# Patient Record
Sex: Female | Born: 1940
Health system: Southern US, Community
[De-identification: ages and names within clinical notes are randomized; demographics above are authoritative.]

## PROBLEM LIST (undated history)

## (undated) DIAGNOSIS — F329 Major depressive disorder, single episode, unspecified: Secondary | ICD-10-CM

## (undated) DIAGNOSIS — F32A Depression, unspecified: Secondary | ICD-10-CM

## (undated) DIAGNOSIS — M1990 Unspecified osteoarthritis, unspecified site: Secondary | ICD-10-CM

## (undated) DIAGNOSIS — R251 Tremor, unspecified: Secondary | ICD-10-CM

## (undated) DIAGNOSIS — I10 Essential (primary) hypertension: Secondary | ICD-10-CM

## (undated) DIAGNOSIS — K219 Gastro-esophageal reflux disease without esophagitis: Secondary | ICD-10-CM

## (undated) DIAGNOSIS — F419 Anxiety disorder, unspecified: Secondary | ICD-10-CM

## (undated) DIAGNOSIS — D563 Thalassemia minor: Secondary | ICD-10-CM

## (undated) DIAGNOSIS — D649 Anemia, unspecified: Secondary | ICD-10-CM

## (undated) DIAGNOSIS — R51 Headache: Secondary | ICD-10-CM

## (undated) DIAGNOSIS — N390 Urinary tract infection, site not specified: Secondary | ICD-10-CM

## (undated) DIAGNOSIS — G4733 Obstructive sleep apnea (adult) (pediatric): Secondary | ICD-10-CM

## (undated) DIAGNOSIS — L299 Pruritus, unspecified: Secondary | ICD-10-CM

## (undated) DIAGNOSIS — E669 Obesity, unspecified: Secondary | ICD-10-CM

## (undated) DIAGNOSIS — E785 Hyperlipidemia, unspecified: Secondary | ICD-10-CM

## (undated) DIAGNOSIS — E611 Iron deficiency: Secondary | ICD-10-CM

## (undated) DIAGNOSIS — L0390 Cellulitis, unspecified: Secondary | ICD-10-CM

## (undated) DIAGNOSIS — D509 Iron deficiency anemia, unspecified: Secondary | ICD-10-CM

## (undated) DIAGNOSIS — R569 Unspecified convulsions: Secondary | ICD-10-CM

## (undated) HISTORY — DX: Anemia, unspecified: D64.9

## (undated) HISTORY — DX: Major depressive disorder, single episode, unspecified: F32.9

## (undated) HISTORY — DX: Anxiety disorder, unspecified: F41.9

## (undated) HISTORY — DX: Essential (primary) hypertension: I10

## (undated) HISTORY — DX: Thalassemia minor: D56.3

## (undated) HISTORY — DX: Obesity, unspecified: E66.9

## (undated) HISTORY — DX: Iron deficiency: E61.1

## (undated) HISTORY — DX: Unspecified convulsions: R56.9

## (undated) HISTORY — DX: Tremor, unspecified: R25.1

## (undated) HISTORY — DX: Gastro-esophageal reflux disease without esophagitis: K21.9

## (undated) HISTORY — DX: Iron deficiency anemia, unspecified: D50.9

## (undated) HISTORY — PX: BREAST EXCISIONAL BIOPSY: SUR124

## (undated) HISTORY — DX: Urinary tract infection, site not specified: N39.0

## (undated) HISTORY — DX: Cellulitis, unspecified: L03.90

## (undated) HISTORY — DX: Headache: R51

## (undated) HISTORY — PX: CATARACT EXTRACTION: SUR2

## (undated) HISTORY — DX: Depression, unspecified: F32.A

## (undated) HISTORY — PX: COLONOSCOPY: SHX174

## (undated) HISTORY — DX: Hyperlipidemia, unspecified: E78.5

## (undated) HISTORY — DX: Unspecified osteoarthritis, unspecified site: M19.90

## (undated) HISTORY — DX: Pruritus, unspecified: L29.9

## (undated) HISTORY — PX: CHOLECYSTECTOMY: SHX55

## (undated) HISTORY — DX: Obstructive sleep apnea (adult) (pediatric): G47.33

## (undated) HISTORY — PX: ABDOMINAL HYSTERECTOMY: SHX81

---

## 2002-03-04 ENCOUNTER — Ambulatory Visit (HOSPITAL_BASED_OUTPATIENT_CLINIC_OR_DEPARTMENT_OTHER): Admission: RE | Admit: 2002-03-04 | Discharge: 2002-03-04 | Payer: Self-pay | Admitting: Family Medicine

## 2003-07-27 ENCOUNTER — Ambulatory Visit (HOSPITAL_COMMUNITY): Admission: RE | Admit: 2003-07-27 | Discharge: 2003-07-27 | Payer: Self-pay | Admitting: Internal Medicine

## 2003-12-02 ENCOUNTER — Ambulatory Visit (HOSPITAL_COMMUNITY): Admission: RE | Admit: 2003-12-02 | Discharge: 2003-12-02 | Payer: Self-pay | Admitting: Family Medicine

## 2004-07-31 ENCOUNTER — Ambulatory Visit: Payer: Self-pay | Admitting: Family Medicine

## 2004-08-24 ENCOUNTER — Ambulatory Visit: Payer: Self-pay | Admitting: Family Medicine

## 2004-08-29 ENCOUNTER — Ambulatory Visit: Payer: Self-pay | Admitting: Orthopedic Surgery

## 2004-09-22 ENCOUNTER — Ambulatory Visit: Payer: Self-pay | Admitting: Family Medicine

## 2004-09-28 ENCOUNTER — Ambulatory Visit: Payer: Self-pay | Admitting: Orthopedic Surgery

## 2004-11-21 ENCOUNTER — Ambulatory Visit: Payer: Self-pay | Admitting: Family Medicine

## 2004-11-23 ENCOUNTER — Ambulatory Visit: Payer: Self-pay | Admitting: Orthopedic Surgery

## 2004-11-23 ENCOUNTER — Ambulatory Visit (HOSPITAL_COMMUNITY): Admission: RE | Admit: 2004-11-23 | Discharge: 2004-11-23 | Payer: Self-pay | Admitting: Family Medicine

## 2004-12-18 ENCOUNTER — Emergency Department (HOSPITAL_COMMUNITY): Admission: EM | Admit: 2004-12-18 | Discharge: 2004-12-18 | Payer: Self-pay | Admitting: Emergency Medicine

## 2004-12-20 ENCOUNTER — Ambulatory Visit: Payer: Self-pay | Admitting: Family Medicine

## 2005-01-10 ENCOUNTER — Ambulatory Visit: Payer: Self-pay | Admitting: Family Medicine

## 2005-02-09 ENCOUNTER — Ambulatory Visit: Payer: Self-pay | Admitting: Family Medicine

## 2005-03-12 ENCOUNTER — Ambulatory Visit: Payer: Self-pay | Admitting: Family Medicine

## 2005-05-11 ENCOUNTER — Ambulatory Visit: Payer: Self-pay | Admitting: Family Medicine

## 2005-06-29 ENCOUNTER — Ambulatory Visit: Payer: Self-pay | Admitting: Family Medicine

## 2005-08-20 ENCOUNTER — Ambulatory Visit: Payer: Self-pay | Admitting: Family Medicine

## 2005-10-09 ENCOUNTER — Ambulatory Visit: Payer: Self-pay | Admitting: Family Medicine

## 2005-10-30 ENCOUNTER — Ambulatory Visit: Payer: Self-pay | Admitting: Family Medicine

## 2005-11-15 ENCOUNTER — Ambulatory Visit: Payer: Self-pay | Admitting: Family Medicine

## 2005-11-15 ENCOUNTER — Ambulatory Visit (HOSPITAL_COMMUNITY): Admission: RE | Admit: 2005-11-15 | Discharge: 2005-11-15 | Payer: Self-pay | Admitting: Family Medicine

## 2005-12-17 ENCOUNTER — Ambulatory Visit: Payer: Self-pay | Admitting: Family Medicine

## 2006-01-28 ENCOUNTER — Ambulatory Visit: Payer: Self-pay | Admitting: Family Medicine

## 2006-02-20 ENCOUNTER — Ambulatory Visit: Payer: Self-pay | Admitting: Family Medicine

## 2006-03-13 ENCOUNTER — Ambulatory Visit: Payer: Self-pay | Admitting: Internal Medicine

## 2006-03-14 ENCOUNTER — Ambulatory Visit (HOSPITAL_COMMUNITY): Admission: RE | Admit: 2006-03-14 | Discharge: 2006-03-14 | Payer: Self-pay | Admitting: Internal Medicine

## 2006-03-19 ENCOUNTER — Ambulatory Visit: Payer: Self-pay | Admitting: Family Medicine

## 2006-03-20 ENCOUNTER — Ambulatory Visit (HOSPITAL_COMMUNITY): Admission: RE | Admit: 2006-03-20 | Discharge: 2006-03-20 | Payer: Self-pay | Admitting: Family Medicine

## 2006-04-01 ENCOUNTER — Ambulatory Visit: Payer: Self-pay | Admitting: Cardiology

## 2006-04-04 ENCOUNTER — Ambulatory Visit: Payer: Self-pay | Admitting: Internal Medicine

## 2006-04-08 ENCOUNTER — Ambulatory Visit: Payer: Self-pay | Admitting: Cardiology

## 2006-04-23 ENCOUNTER — Ambulatory Visit: Payer: Self-pay | Admitting: Family Medicine

## 2006-05-29 ENCOUNTER — Ambulatory Visit: Payer: Self-pay | Admitting: Family Medicine

## 2006-08-07 ENCOUNTER — Ambulatory Visit: Payer: Self-pay | Admitting: Family Medicine

## 2006-09-03 ENCOUNTER — Ambulatory Visit: Payer: Self-pay | Admitting: Family Medicine

## 2006-10-15 ENCOUNTER — Ambulatory Visit: Payer: Self-pay | Admitting: Family Medicine

## 2006-10-15 LAB — CONVERTED CEMR LAB: Microalb, Ur: 0.78 mg/dL (ref 0.00–1.89)

## 2006-11-22 ENCOUNTER — Encounter: Payer: Self-pay | Admitting: Family Medicine

## 2006-11-22 LAB — CONVERTED CEMR LAB: Hgb A1c MFr Bld: 7.9 % — ABNORMAL HIGH (ref 4.6–6.1)

## 2006-11-26 ENCOUNTER — Ambulatory Visit: Payer: Self-pay | Admitting: Family Medicine

## 2007-01-22 ENCOUNTER — Ambulatory Visit: Payer: Self-pay | Admitting: Family Medicine

## 2007-01-23 ENCOUNTER — Encounter: Payer: Self-pay | Admitting: Family Medicine

## 2007-02-14 ENCOUNTER — Encounter: Payer: Self-pay | Admitting: Family Medicine

## 2007-02-14 LAB — CONVERTED CEMR LAB
ALT: 13 units/L (ref 0–35)
AST: 14 units/L (ref 0–37)
Albumin: 4.4 g/dL (ref 3.5–5.2)
Alkaline Phosphatase: 98 units/L (ref 39–117)
BUN: 15 mg/dL (ref 6–23)
Basophils Absolute: 0 10*3/uL (ref 0.0–0.1)
Basophils Relative: 0 % (ref 0–1)
Bilirubin, Direct: 0.1 mg/dL (ref 0.0–0.3)
CO2: 30 meq/L (ref 19–32)
Calcium: 9.5 mg/dL (ref 8.4–10.5)
Chloride: 101 meq/L (ref 96–112)
Cholesterol: 167 mg/dL (ref 0–200)
Creatinine, Ser: 0.8 mg/dL (ref 0.40–1.20)
Eosinophils Absolute: 0.2 10*3/uL (ref 0.0–0.7)
Eosinophils Relative: 3 % (ref 0–5)
Glucose, Bld: 163 mg/dL — ABNORMAL HIGH (ref 70–99)
HCT: 35.2 % — ABNORMAL LOW (ref 36.0–46.0)
HDL: 74 mg/dL (ref >39)
Hemoglobin: 10.4 g/dL — ABNORMAL LOW (ref 12.0–15.0)
Hgb A1c MFr Bld: 7.8 % — ABNORMAL HIGH (ref 4.6–6.1)
Indirect Bilirubin: 0.3 mg/dL (ref 0.0–0.9)
LDL Cholesterol: 76 mg/dL (ref 0–99)
Lymphocytes Relative: 41 % (ref 12–46)
Lymphs Abs: 2.5 10*3/uL (ref 0.7–3.3)
MCHC: 29.5 g/dL — ABNORMAL LOW (ref 30.0–36.0)
MCV: 70.7 fL — ABNORMAL LOW (ref 78.0–100.0)
Monocytes Absolute: 0.4 10*3/uL (ref 0.2–0.7)
Monocytes Relative: 7 % (ref 3–11)
Neutro Abs: 3 10*3/uL (ref 1.7–7.7)
Neutrophils Relative %: 48 % (ref 43–77)
Platelets: 214 10*3/uL (ref 150–400)
Potassium: 4 meq/L (ref 3.5–5.3)
RBC: 4.98 M/uL (ref 3.87–5.11)
RDW: 15.9 % — ABNORMAL HIGH (ref 11.5–14.0)
Sodium: 143 meq/L (ref 135–145)
Total Bilirubin: 0.4 mg/dL (ref 0.3–1.2)
Total CHOL/HDL Ratio: 2.3
Total Protein: 7.6 g/dL (ref 6.0–8.3)
Triglycerides: 83 mg/dL (ref <150)
VLDL: 17 mg/dL (ref 0–40)
WBC: 6.1 10*3/uL (ref 4.0–10.5)

## 2007-02-18 ENCOUNTER — Encounter: Payer: Self-pay | Admitting: Family Medicine

## 2007-02-18 LAB — CONVERTED CEMR LAB
Ferritin: 167 ng/mL (ref 10–291)
Folate: 8.3 ng/mL
Iron: 54 ug/dL (ref 42–145)
Saturation Ratios: 17 % — ABNORMAL LOW (ref 20–55)
TIBC: 321 ug/dL (ref 250–470)
UIBC: 267 ug/dL
Vitamin B-12: 458 pg/mL (ref 211–911)

## 2007-02-26 ENCOUNTER — Ambulatory Visit: Payer: Self-pay | Admitting: Family Medicine

## 2007-03-06 ENCOUNTER — Ambulatory Visit (HOSPITAL_COMMUNITY): Admission: RE | Admit: 2007-03-06 | Discharge: 2007-03-06 | Payer: Self-pay | Admitting: Family Medicine

## 2007-04-08 ENCOUNTER — Ambulatory Visit: Payer: Self-pay | Admitting: Family Medicine

## 2007-04-09 ENCOUNTER — Encounter: Payer: Self-pay | Admitting: Family Medicine

## 2007-04-10 ENCOUNTER — Ambulatory Visit (HOSPITAL_COMMUNITY): Admission: RE | Admit: 2007-04-10 | Discharge: 2007-04-10 | Payer: Self-pay | Admitting: Family Medicine

## 2007-04-24 ENCOUNTER — Ambulatory Visit (HOSPITAL_COMMUNITY): Admission: RE | Admit: 2007-04-24 | Discharge: 2007-04-24 | Payer: Self-pay | Admitting: Pediatrics

## 2007-07-03 ENCOUNTER — Other Ambulatory Visit: Admission: RE | Admit: 2007-07-03 | Discharge: 2007-07-03 | Payer: Self-pay | Admitting: Family Medicine

## 2007-07-03 ENCOUNTER — Ambulatory Visit: Payer: Self-pay | Admitting: Family Medicine

## 2007-07-03 LAB — CONVERTED CEMR LAB: Pap Smear: NORMAL

## 2007-07-14 ENCOUNTER — Encounter: Payer: Self-pay | Admitting: Family Medicine

## 2007-07-14 LAB — CONVERTED CEMR LAB
ALT: 21 units/L (ref 0–35)
AST: 20 units/L (ref 0–37)
Albumin: 4.3 g/dL (ref 3.5–5.2)
Alkaline Phosphatase: 93 units/L (ref 39–117)
BUN: 10 mg/dL (ref 6–23)
Bilirubin, Direct: 0.1 mg/dL (ref 0.0–0.3)
CO2: 30 meq/L (ref 19–32)
Calcium: 9.5 mg/dL (ref 8.4–10.5)
Chloride: 103 meq/L (ref 96–112)
Cholesterol: 160 mg/dL (ref 0–200)
Creatinine, Ser: 0.66 mg/dL (ref 0.40–1.20)
Glucose, Bld: 180 mg/dL — ABNORMAL HIGH (ref 70–99)
HDL: 62 mg/dL (ref >39)
Indirect Bilirubin: 0.2 mg/dL (ref 0.0–0.9)
LDL Cholesterol: 66 mg/dL (ref 0–99)
Potassium: 4.3 meq/L (ref 3.5–5.3)
Sodium: 143 meq/L (ref 135–145)
Total Bilirubin: 0.3 mg/dL (ref 0.3–1.2)
Total CHOL/HDL Ratio: 2.6
Total Protein: 7.6 g/dL (ref 6.0–8.3)
Triglycerides: 159 mg/dL — ABNORMAL HIGH (ref <150)
VLDL: 32 mg/dL (ref 0–40)

## 2007-08-14 ENCOUNTER — Ambulatory Visit: Payer: Self-pay | Admitting: Family Medicine

## 2007-08-28 ENCOUNTER — Ambulatory Visit: Payer: Self-pay | Admitting: Family Medicine

## 2007-09-05 ENCOUNTER — Encounter: Payer: Self-pay | Admitting: Family Medicine

## 2007-09-09 ENCOUNTER — Ambulatory Visit: Payer: Self-pay | Admitting: Family Medicine

## 2007-10-01 ENCOUNTER — Encounter: Payer: Self-pay | Admitting: Family Medicine

## 2007-10-01 LAB — CONVERTED CEMR LAB
ALT: 26 units/L (ref 0–35)
AST: 19 units/L (ref 0–37)
Albumin: 4.3 g/dL (ref 3.5–5.2)
Alkaline Phosphatase: 101 units/L (ref 39–117)
BUN: 16 mg/dL (ref 6–23)
Bilirubin, Direct: 0.1 mg/dL (ref 0.0–0.3)
CO2: 28 meq/L (ref 19–32)
Calcium: 9.2 mg/dL (ref 8.4–10.5)
Chloride: 100 meq/L (ref 96–112)
Cholesterol: 161 mg/dL (ref 0–200)
Creatinine, Ser: 0.76 mg/dL (ref 0.40–1.20)
Glucose, Bld: 238 mg/dL — ABNORMAL HIGH (ref 70–99)
HDL: 73 mg/dL (ref >39)
Indirect Bilirubin: 0.2 mg/dL (ref 0.0–0.9)
LDL Cholesterol: 69 mg/dL (ref 0–99)
Microalb, Ur: 0.9 mg/dL (ref 0.00–1.89)
Potassium: 4.5 meq/L (ref 3.5–5.3)
Sodium: 140 meq/L (ref 135–145)
TSH: 2.276 microintl units/mL (ref 0.350–5.50)
Total Bilirubin: 0.3 mg/dL (ref 0.3–1.2)
Total CHOL/HDL Ratio: 2.2
Total Protein: 7.7 g/dL (ref 6.0–8.3)
Triglycerides: 96 mg/dL (ref <150)
VLDL: 19 mg/dL (ref 0–40)

## 2007-10-06 ENCOUNTER — Ambulatory Visit: Payer: Self-pay | Admitting: Family Medicine

## 2007-11-03 ENCOUNTER — Emergency Department (HOSPITAL_COMMUNITY): Admission: EM | Admit: 2007-11-03 | Discharge: 2007-11-03 | Payer: Self-pay | Admitting: Emergency Medicine

## 2007-12-18 ENCOUNTER — Ambulatory Visit: Payer: Self-pay | Admitting: Family Medicine

## 2008-01-29 ENCOUNTER — Ambulatory Visit: Payer: Self-pay | Admitting: Family Medicine

## 2008-02-09 ENCOUNTER — Encounter: Payer: Self-pay | Admitting: Family Medicine

## 2008-02-09 LAB — CONVERTED CEMR LAB
ALT: 26 units/L (ref 0–35)
AST: 23 units/L (ref 0–37)
Albumin: 4.2 g/dL (ref 3.5–5.2)
Alkaline Phosphatase: 86 units/L (ref 39–117)
BUN: 12 mg/dL (ref 6–23)
Basophils Absolute: 0 10*3/uL (ref 0.0–0.1)
Basophils Relative: 0 % (ref 0–1)
Bilirubin, Direct: 0.1 mg/dL (ref 0.0–0.3)
CO2: 30 meq/L (ref 19–32)
Calcium: 9.3 mg/dL (ref 8.4–10.5)
Chloride: 103 meq/L (ref 96–112)
Cholesterol: 167 mg/dL (ref 0–200)
Creatinine, Ser: 0.68 mg/dL (ref 0.40–1.20)
Eosinophils Absolute: 0.2 10*3/uL (ref 0.0–0.7)
Eosinophils Relative: 4 % (ref 0–5)
Glucose, Bld: 168 mg/dL — ABNORMAL HIGH (ref 70–99)
HCT: 34.4 % — ABNORMAL LOW (ref 36.0–46.0)
HDL: 64 mg/dL (ref >39)
Hemoglobin: 10.2 g/dL — ABNORMAL LOW (ref 12.0–15.0)
Indirect Bilirubin: 0.2 mg/dL (ref 0.0–0.9)
LDL Cholesterol: 81 mg/dL (ref 0–99)
Lymphocytes Relative: 40 % (ref 12–46)
Lymphs Abs: 2.4 10*3/uL (ref 0.7–4.0)
MCHC: 29.7 g/dL — ABNORMAL LOW (ref 30.0–36.0)
MCV: 69.4 fL — ABNORMAL LOW (ref 78.0–100.0)
Monocytes Absolute: 0.3 10*3/uL (ref 0.1–1.0)
Monocytes Relative: 5 % (ref 3–12)
Neutro Abs: 3.1 10*3/uL (ref 1.7–7.7)
Neutrophils Relative %: 51 % (ref 43–77)
Platelets: 212 10*3/uL (ref 150–400)
Potassium: 4.1 meq/L (ref 3.5–5.3)
RBC: 4.96 M/uL (ref 3.87–5.11)
RDW: 14.9 % (ref 11.5–15.5)
Sodium: 144 meq/L (ref 135–145)
Total Bilirubin: 0.3 mg/dL (ref 0.3–1.2)
Total CHOL/HDL Ratio: 2.6
Total Protein: 7.5 g/dL (ref 6.0–8.3)
Triglycerides: 111 mg/dL (ref <150)
VLDL: 22 mg/dL (ref 0–40)
WBC: 6.1 10*3/uL (ref 4.0–10.5)

## 2008-03-10 ENCOUNTER — Ambulatory Visit: Payer: Self-pay | Admitting: Family Medicine

## 2008-04-21 ENCOUNTER — Encounter: Payer: Self-pay | Admitting: Family Medicine

## 2008-04-21 ENCOUNTER — Ambulatory Visit: Payer: Self-pay | Admitting: Family Medicine

## 2008-04-21 DIAGNOSIS — F419 Anxiety disorder, unspecified: Secondary | ICD-10-CM

## 2008-04-21 DIAGNOSIS — K219 Gastro-esophageal reflux disease without esophagitis: Secondary | ICD-10-CM | POA: Insufficient documentation

## 2008-04-21 DIAGNOSIS — R32 Unspecified urinary incontinence: Secondary | ICD-10-CM | POA: Insufficient documentation

## 2008-04-21 DIAGNOSIS — I10 Essential (primary) hypertension: Secondary | ICD-10-CM | POA: Insufficient documentation

## 2008-04-21 DIAGNOSIS — M1712 Unilateral primary osteoarthritis, left knee: Secondary | ICD-10-CM | POA: Insufficient documentation

## 2008-04-21 DIAGNOSIS — F32A Depression, unspecified: Secondary | ICD-10-CM | POA: Insufficient documentation

## 2008-04-21 DIAGNOSIS — F411 Generalized anxiety disorder: Secondary | ICD-10-CM | POA: Insufficient documentation

## 2008-04-21 DIAGNOSIS — F329 Major depressive disorder, single episode, unspecified: Secondary | ICD-10-CM

## 2008-04-21 DIAGNOSIS — E785 Hyperlipidemia, unspecified: Secondary | ICD-10-CM | POA: Insufficient documentation

## 2008-04-21 LAB — CONVERTED CEMR LAB
Glucose, Bld: 342 mg/dL
Hgb A1c MFr Bld: 11 %

## 2008-04-22 ENCOUNTER — Encounter: Payer: Self-pay | Admitting: Family Medicine

## 2008-04-22 ENCOUNTER — Ambulatory Visit (HOSPITAL_COMMUNITY): Admission: RE | Admit: 2008-04-22 | Discharge: 2008-04-22 | Payer: Self-pay | Admitting: Family Medicine

## 2008-04-28 ENCOUNTER — Telehealth: Payer: Self-pay | Admitting: Family Medicine

## 2008-06-01 ENCOUNTER — Telehealth: Payer: Self-pay | Admitting: Family Medicine

## 2008-07-15 ENCOUNTER — Ambulatory Visit: Payer: Self-pay | Admitting: Family Medicine

## 2008-07-15 LAB — CONVERTED CEMR LAB
Glucose, Bld: 219 mg/dL
Hgb A1c MFr Bld: 11.7 %

## 2008-08-30 ENCOUNTER — Telehealth: Payer: Self-pay | Admitting: Family Medicine

## 2008-09-08 ENCOUNTER — Encounter: Payer: Self-pay | Admitting: Family Medicine

## 2008-09-09 LAB — CONVERTED CEMR LAB
ALT: 24 units/L (ref 0–35)
AST: 23 units/L (ref 0–37)
Albumin: 4.4 g/dL (ref 3.5–5.2)
Alkaline Phosphatase: 79 units/L (ref 39–117)
BUN: 19 mg/dL (ref 6–23)
Bilirubin, Direct: 0.1 mg/dL (ref 0.0–0.3)
CO2: 24 meq/L (ref 19–32)
Calcium: 9.6 mg/dL (ref 8.4–10.5)
Chloride: 102 meq/L (ref 96–112)
Cholesterol: 197 mg/dL (ref 0–200)
Creatinine, Ser: 0.74 mg/dL (ref 0.40–1.20)
Glucose, Bld: 138 mg/dL — ABNORMAL HIGH (ref 70–99)
HDL: 64 mg/dL (ref >39)
Indirect Bilirubin: 0.3 mg/dL (ref 0.0–0.9)
LDL Cholesterol: 110 mg/dL — ABNORMAL HIGH (ref 0–99)
Potassium: 4.1 meq/L (ref 3.5–5.3)
Sodium: 143 meq/L (ref 135–145)
Total Bilirubin: 0.4 mg/dL (ref 0.3–1.2)
Total CHOL/HDL Ratio: 3.1
Total Protein: 7.8 g/dL (ref 6.0–8.3)
Triglycerides: 113 mg/dL (ref <150)
VLDL: 23 mg/dL (ref 0–40)

## 2008-09-13 ENCOUNTER — Encounter: Payer: Self-pay | Admitting: Family Medicine

## 2008-09-14 ENCOUNTER — Encounter: Payer: Self-pay | Admitting: Family Medicine

## 2008-09-28 ENCOUNTER — Telehealth: Payer: Self-pay | Admitting: Family Medicine

## 2008-09-28 ENCOUNTER — Encounter: Payer: Self-pay | Admitting: Family Medicine

## 2008-10-08 ENCOUNTER — Telehealth: Payer: Self-pay | Admitting: Family Medicine

## 2008-10-21 ENCOUNTER — Ambulatory Visit: Payer: Self-pay | Admitting: Family Medicine

## 2008-10-21 ENCOUNTER — Ambulatory Visit (HOSPITAL_COMMUNITY): Admission: RE | Admit: 2008-10-21 | Discharge: 2008-10-21 | Payer: Self-pay | Admitting: Family Medicine

## 2008-10-21 DIAGNOSIS — M542 Cervicalgia: Secondary | ICD-10-CM

## 2008-10-21 HISTORY — DX: Cervicalgia: M54.2

## 2008-10-21 LAB — CONVERTED CEMR LAB
ALT: 19 units/L (ref 0–35)
AST: 22 units/L (ref 0–37)
Albumin: 4.2 g/dL (ref 3.5–5.2)
Alkaline Phosphatase: 78 units/L (ref 39–117)
BUN: 17 mg/dL (ref 6–23)
Bilirubin, Direct: 0.1 mg/dL (ref 0.0–0.3)
CO2: 26 meq/L (ref 19–32)
Calcium: 9.4 mg/dL (ref 8.4–10.5)
Chloride: 103 meq/L (ref 96–112)
Cholesterol: 185 mg/dL (ref 0–200)
Creatinine, Ser: 0.76 mg/dL (ref 0.40–1.20)
Glucose, Bld: 104 mg/dL
Glucose, Bld: 96 mg/dL (ref 70–99)
HDL: 58 mg/dL (ref >39)
Hgb A1c MFr Bld: 10 %
Indirect Bilirubin: 0.2 mg/dL (ref 0.0–0.9)
LDL Cholesterol: 109 mg/dL — ABNORMAL HIGH (ref 0–99)
Potassium: 4.4 meq/L (ref 3.5–5.3)
Sodium: 143 meq/L (ref 135–145)
Total Bilirubin: 0.3 mg/dL (ref 0.3–1.2)
Total CHOL/HDL Ratio: 3.2
Total Protein: 7.4 g/dL (ref 6.0–8.3)
Triglycerides: 88 mg/dL (ref <150)
VLDL: 18 mg/dL (ref 0–40)

## 2008-10-26 ENCOUNTER — Encounter: Payer: Self-pay | Admitting: Family Medicine

## 2008-11-02 ENCOUNTER — Encounter: Payer: Self-pay | Admitting: Family Medicine

## 2008-11-09 ENCOUNTER — Encounter (HOSPITAL_COMMUNITY): Admission: RE | Admit: 2008-11-09 | Discharge: 2008-12-09 | Payer: Self-pay | Admitting: Family Medicine

## 2008-11-09 ENCOUNTER — Encounter: Payer: Self-pay | Admitting: Family Medicine

## 2008-11-14 ENCOUNTER — Emergency Department (HOSPITAL_COMMUNITY): Admission: EM | Admit: 2008-11-14 | Discharge: 2008-11-14 | Payer: Self-pay | Admitting: Emergency Medicine

## 2008-11-18 ENCOUNTER — Telehealth: Payer: Self-pay | Admitting: Family Medicine

## 2008-11-18 ENCOUNTER — Encounter: Payer: Self-pay | Admitting: Family Medicine

## 2008-12-02 ENCOUNTER — Telehealth: Payer: Self-pay | Admitting: Family Medicine

## 2008-12-03 ENCOUNTER — Ambulatory Visit: Payer: Self-pay | Admitting: Family Medicine

## 2008-12-03 LAB — CONVERTED CEMR LAB: Blood Glucose, Fasting: 151 mg/dL

## 2008-12-23 ENCOUNTER — Encounter: Payer: Self-pay | Admitting: Family Medicine

## 2009-01-10 ENCOUNTER — Encounter: Payer: Self-pay | Admitting: Family Medicine

## 2009-01-11 ENCOUNTER — Telehealth: Payer: Self-pay | Admitting: Family Medicine

## 2009-01-13 ENCOUNTER — Encounter: Payer: Self-pay | Admitting: Family Medicine

## 2009-01-19 ENCOUNTER — Ambulatory Visit: Payer: Self-pay | Admitting: Family Medicine

## 2009-01-19 LAB — CONVERTED CEMR LAB
Glucose, Bld: 231 mg/dL
Hgb A1c MFr Bld: 9.5 %

## 2009-02-02 ENCOUNTER — Ambulatory Visit (HOSPITAL_COMMUNITY): Admission: RE | Admit: 2009-02-02 | Discharge: 2009-02-02 | Payer: Self-pay | Admitting: Family Medicine

## 2009-02-02 ENCOUNTER — Ambulatory Visit: Payer: Self-pay | Admitting: Family Medicine

## 2009-02-02 LAB — CONVERTED CEMR LAB: Glucose, Bld: 254 mg/dL

## 2009-02-03 ENCOUNTER — Encounter: Payer: Self-pay | Admitting: Family Medicine

## 2009-02-17 ENCOUNTER — Ambulatory Visit (HOSPITAL_COMMUNITY): Admission: RE | Admit: 2009-02-17 | Discharge: 2009-02-17 | Payer: Self-pay | Admitting: Family Medicine

## 2009-02-23 ENCOUNTER — Ambulatory Visit: Admission: RE | Admit: 2009-02-23 | Discharge: 2009-02-23 | Payer: Self-pay | Admitting: Family Medicine

## 2009-02-23 ENCOUNTER — Encounter: Payer: Self-pay | Admitting: Family Medicine

## 2009-03-07 ENCOUNTER — Encounter: Payer: Self-pay | Admitting: Family Medicine

## 2009-03-10 ENCOUNTER — Ambulatory Visit: Payer: Self-pay | Admitting: Family Medicine

## 2009-03-11 ENCOUNTER — Encounter: Payer: Self-pay | Admitting: Family Medicine

## 2009-03-11 LAB — CONVERTED CEMR LAB
ALT: 30 units/L (ref 0–35)
AST: 31 units/L (ref 0–37)
Albumin: 4.2 g/dL (ref 3.5–5.2)
Alkaline Phosphatase: 80 units/L (ref 39–117)
BUN: 13 mg/dL (ref 6–23)
Bilirubin, Direct: 0.2 mg/dL (ref 0.0–0.3)
CO2: 26 meq/L (ref 19–32)
Calcium: 9.5 mg/dL (ref 8.4–10.5)
Chloride: 100 meq/L (ref 96–112)
Cholesterol: 196 mg/dL (ref 0–200)
Creatinine, Ser: 0.79 mg/dL (ref 0.40–1.20)
Creatinine, Urine: 144.2 mg/dL
Glucose, Bld: 237 mg/dL — ABNORMAL HIGH (ref 70–99)
HDL: 65 mg/dL (ref >39)
Indirect Bilirubin: 0.2 mg/dL (ref 0.0–0.9)
LDL Cholesterol: 108 mg/dL — ABNORMAL HIGH (ref 0–99)
Microalb Creat Ratio: 14.9 mg/g (ref 0.0–30.0)
Microalb, Ur: 2.15 mg/dL — ABNORMAL HIGH (ref 0.00–1.89)
Potassium: 4.6 meq/L (ref 3.5–5.3)
Sodium: 140 meq/L (ref 135–145)
TSH: 1.184 microintl units/mL (ref 0.350–4.500)
Total Bilirubin: 0.4 mg/dL (ref 0.3–1.2)
Total CHOL/HDL Ratio: 3
Total Protein: 7.8 g/dL (ref 6.0–8.3)
Triglycerides: 117 mg/dL (ref <150)
VLDL: 23 mg/dL (ref 0–40)

## 2009-03-21 ENCOUNTER — Telehealth: Payer: Self-pay | Admitting: Family Medicine

## 2009-04-01 ENCOUNTER — Encounter: Payer: Self-pay | Admitting: Family Medicine

## 2009-04-07 ENCOUNTER — Encounter: Payer: Self-pay | Admitting: Family Medicine

## 2009-04-20 ENCOUNTER — Ambulatory Visit: Payer: Self-pay | Admitting: Family Medicine

## 2009-04-20 LAB — CONVERTED CEMR LAB
Glucose, Bld: 332 mg/dL
Hgb A1c MFr Bld: 9.5 %

## 2009-05-17 ENCOUNTER — Telehealth: Payer: Self-pay | Admitting: Family Medicine

## 2009-05-17 ENCOUNTER — Encounter: Payer: Self-pay | Admitting: Family Medicine

## 2009-06-01 ENCOUNTER — Encounter: Payer: Self-pay | Admitting: Family Medicine

## 2009-06-03 ENCOUNTER — Telehealth: Payer: Self-pay | Admitting: Family Medicine

## 2009-06-14 ENCOUNTER — Telehealth: Payer: Self-pay | Admitting: Family Medicine

## 2009-06-17 ENCOUNTER — Ambulatory Visit: Payer: Self-pay | Admitting: Family Medicine

## 2009-06-17 DIAGNOSIS — R5381 Other malaise: Secondary | ICD-10-CM | POA: Insufficient documentation

## 2009-06-17 DIAGNOSIS — R5383 Other fatigue: Secondary | ICD-10-CM

## 2009-06-17 LAB — CONVERTED CEMR LAB: Glucose, Bld: 113 mg/dL

## 2009-06-20 ENCOUNTER — Encounter: Payer: Self-pay | Admitting: Family Medicine

## 2009-06-20 LAB — CONVERTED CEMR LAB
ALT: 25 units/L (ref 0–35)
AST: 25 units/L (ref 0–37)
Albumin: 4.4 g/dL (ref 3.5–5.2)
Alkaline Phosphatase: 78 units/L (ref 39–117)
BUN: 13 mg/dL (ref 6–23)
Basophils Absolute: 0 10*3/uL (ref 0.0–0.1)
Basophils Relative: 0 % (ref 0–1)
Bilirubin, Direct: <0.1 mg/dL (ref 0.0–0.3)
CO2: 28 meq/L (ref 19–32)
Calcium: 9.2 mg/dL (ref 8.4–10.5)
Chloride: 102 meq/L (ref 96–112)
Cholesterol: 192 mg/dL (ref 0–200)
Creatinine, Ser: 0.74 mg/dL (ref 0.40–1.20)
Eosinophils Absolute: 0.2 10*3/uL (ref 0.0–0.7)
Eosinophils Relative: 3 % (ref 0–5)
Glucose, Bld: 82 mg/dL (ref 70–99)
HCT: 36.4 % (ref 36.0–46.0)
HDL: 71 mg/dL (ref >39)
Hemoglobin: 10.6 g/dL — ABNORMAL LOW (ref 12.0–15.0)
LDL Cholesterol: 99 mg/dL (ref 0–99)
Lymphocytes Relative: 42 % (ref 12–46)
Lymphs Abs: 2.7 10*3/uL (ref 0.7–4.0)
MCHC: 29.1 g/dL — ABNORMAL LOW (ref 30.0–36.0)
MCV: 69.3 fL — ABNORMAL LOW (ref 78.0–100.0)
Monocytes Absolute: 0.4 10*3/uL (ref 0.1–1.0)
Monocytes Relative: 7 % (ref 3–12)
Neutro Abs: 3.1 10*3/uL (ref 1.7–7.7)
Neutrophils Relative %: 48 % (ref 43–77)
Platelets: 228 10*3/uL (ref 150–400)
Potassium: 4.3 meq/L (ref 3.5–5.3)
RBC: 5.25 M/uL — ABNORMAL HIGH (ref 3.87–5.11)
RDW: 15.7 % — ABNORMAL HIGH (ref 11.5–15.5)
Sodium: 143 meq/L (ref 135–145)
Total Bilirubin: 0.3 mg/dL (ref 0.3–1.2)
Total CHOL/HDL Ratio: 2.7
Total Protein: 7.5 g/dL (ref 6.0–8.3)
Triglycerides: 112 mg/dL (ref <150)
VLDL: 22 mg/dL (ref 0–40)
WBC: 6.5 10*3/uL (ref 4.0–10.5)

## 2009-06-27 ENCOUNTER — Encounter: Payer: Self-pay | Admitting: Family Medicine

## 2009-08-24 ENCOUNTER — Ambulatory Visit: Payer: Self-pay | Admitting: Family Medicine

## 2009-08-24 DIAGNOSIS — M79609 Pain in unspecified limb: Secondary | ICD-10-CM | POA: Insufficient documentation

## 2009-08-24 DIAGNOSIS — M25519 Pain in unspecified shoulder: Secondary | ICD-10-CM | POA: Insufficient documentation

## 2009-08-24 LAB — CONVERTED CEMR LAB
Glucose, Bld: 165 mg/dL
Hgb A1c MFr Bld: 10.8 %

## 2009-08-25 ENCOUNTER — Encounter: Payer: Self-pay | Admitting: Family Medicine

## 2009-09-26 ENCOUNTER — Ambulatory Visit (HOSPITAL_COMMUNITY): Admission: RE | Admit: 2009-09-26 | Discharge: 2009-09-26 | Payer: Self-pay | Admitting: Family Medicine

## 2009-09-27 ENCOUNTER — Telehealth: Payer: Self-pay | Admitting: Family Medicine

## 2009-10-26 ENCOUNTER — Ambulatory Visit: Payer: Self-pay | Admitting: Family Medicine

## 2009-10-26 LAB — CONVERTED CEMR LAB
ALT: 33 units/L (ref 0–35)
AST: 35 units/L (ref 0–37)
Albumin: 4.2 g/dL (ref 3.5–5.2)
Alkaline Phosphatase: 91 units/L (ref 39–117)
BUN: 15 mg/dL (ref 6–23)
Bilirubin, Direct: 0.2 mg/dL (ref 0.0–0.3)
Blood Glucose, Fasting: 264 mg/dL
CO2: 31 meq/L (ref 19–32)
Calcium: 9.3 mg/dL (ref 8.4–10.5)
Chloride: 97 meq/L (ref 96–112)
Cholesterol: 191 mg/dL (ref 0–200)
Creatinine, Ser: 0.76 mg/dL (ref 0.40–1.20)
Glucose, Bld: 235 mg/dL — ABNORMAL HIGH (ref 70–99)
HDL: 62 mg/dL (ref >39)
Indirect Bilirubin: 0.2 mg/dL (ref 0.0–0.9)
LDL Cholesterol: 107 mg/dL — ABNORMAL HIGH (ref 0–99)
Potassium: 4.4 meq/L (ref 3.5–5.3)
Sodium: 139 meq/L (ref 135–145)
Total Bilirubin: 0.4 mg/dL (ref 0.3–1.2)
Total CHOL/HDL Ratio: 3.1
Total Protein: 7.4 g/dL (ref 6.0–8.3)
Triglycerides: 111 mg/dL (ref <150)
VLDL: 22 mg/dL (ref 0–40)

## 2009-12-12 ENCOUNTER — Encounter: Payer: Self-pay | Admitting: Family Medicine

## 2009-12-22 ENCOUNTER — Encounter: Payer: Self-pay | Admitting: Family Medicine

## 2010-01-18 ENCOUNTER — Ambulatory Visit: Payer: Self-pay | Admitting: Family Medicine

## 2010-01-18 DIAGNOSIS — M25569 Pain in unspecified knee: Secondary | ICD-10-CM | POA: Insufficient documentation

## 2010-01-19 ENCOUNTER — Ambulatory Visit (HOSPITAL_COMMUNITY)
Admission: RE | Admit: 2010-01-19 | Discharge: 2010-01-19 | Payer: Self-pay | Source: Home / Self Care | Admitting: Family Medicine

## 2010-01-19 ENCOUNTER — Encounter: Payer: Self-pay | Admitting: Family Medicine

## 2010-01-26 ENCOUNTER — Ambulatory Visit: Payer: Self-pay | Admitting: Family Medicine

## 2010-01-26 DIAGNOSIS — D563 Thalassemia minor: Secondary | ICD-10-CM

## 2010-01-26 DIAGNOSIS — D560 Alpha thalassemia: Secondary | ICD-10-CM | POA: Insufficient documentation

## 2010-01-26 DIAGNOSIS — D509 Iron deficiency anemia, unspecified: Secondary | ICD-10-CM

## 2010-01-26 HISTORY — DX: Iron deficiency anemia, unspecified: D50.9

## 2010-01-26 HISTORY — DX: Thalassemia minor: D56.3

## 2010-01-31 LAB — CONVERTED CEMR LAB: Retic Ct Pct: 0.8 % (ref 0.4–3.1)

## 2010-03-14 ENCOUNTER — Ambulatory Visit: Payer: Self-pay | Admitting: Family Medicine

## 2010-03-14 DIAGNOSIS — L299 Pruritus, unspecified: Secondary | ICD-10-CM | POA: Insufficient documentation

## 2010-03-15 ENCOUNTER — Encounter: Payer: Self-pay | Admitting: Family Medicine

## 2010-03-15 LAB — CONVERTED CEMR LAB
Creatinine, Urine: 88.7 mg/dL
Microalb Creat Ratio: 20.1 mg/g (ref 0.0–30.0)
Microalb, Ur: 1.78 mg/dL (ref 0.00–1.89)

## 2010-04-05 ENCOUNTER — Telehealth: Payer: Self-pay | Admitting: Family Medicine

## 2010-05-16 ENCOUNTER — Ambulatory Visit: Payer: Self-pay | Admitting: Family Medicine

## 2010-05-16 DIAGNOSIS — R259 Unspecified abnormal involuntary movements: Secondary | ICD-10-CM | POA: Insufficient documentation

## 2010-05-16 DIAGNOSIS — M899 Disorder of bone, unspecified: Secondary | ICD-10-CM | POA: Insufficient documentation

## 2010-05-16 DIAGNOSIS — M949 Disorder of cartilage, unspecified: Secondary | ICD-10-CM

## 2010-05-18 LAB — CONVERTED CEMR LAB
ALT: 21 units/L (ref 0–35)
AST: 22 units/L (ref 0–37)
Albumin: 4.1 g/dL (ref 3.5–5.2)
Alkaline Phosphatase: 74 units/L (ref 39–117)
BUN: 13 mg/dL (ref 6–23)
Bilirubin, Direct: 0.1 mg/dL (ref 0.0–0.3)
CO2: 31 meq/L (ref 19–32)
Calcium: 9.5 mg/dL (ref 8.4–10.5)
Chloride: 100 meq/L (ref 96–112)
Cholesterol: 192 mg/dL (ref 0–200)
Creatinine, Ser: 0.72 mg/dL (ref 0.40–1.20)
Glucose, Bld: 213 mg/dL — ABNORMAL HIGH (ref 70–99)
HDL: 62 mg/dL (ref >39)
Hgb A1c MFr Bld: 10.7 % — ABNORMAL HIGH (ref <5.7)
Indirect Bilirubin: 0.3 mg/dL (ref 0.0–0.9)
LDL Cholesterol: 102 mg/dL — ABNORMAL HIGH (ref 0–99)
Potassium: 4.1 meq/L (ref 3.5–5.3)
Sodium: 141 meq/L (ref 135–145)
TSH: 1.674 microintl units/mL (ref 0.350–4.500)
Total Bilirubin: 0.4 mg/dL (ref 0.3–1.2)
Total CHOL/HDL Ratio: 3.1
Total Protein: 7.3 g/dL (ref 6.0–8.3)
Triglycerides: 142 mg/dL (ref <150)
VLDL: 28 mg/dL (ref 0–40)
Vit D, 25-Hydroxy: 10 ng/mL — ABNORMAL LOW (ref 30–89)

## 2010-06-08 ENCOUNTER — Encounter: Payer: Self-pay | Admitting: Family Medicine

## 2010-06-30 ENCOUNTER — Ambulatory Visit: Payer: Self-pay | Admitting: Family Medicine

## 2010-07-07 ENCOUNTER — Encounter: Payer: Self-pay | Admitting: Family Medicine

## 2010-07-12 ENCOUNTER — Ambulatory Visit (HOSPITAL_COMMUNITY): Payer: Self-pay | Admitting: Psychiatry

## 2010-07-21 ENCOUNTER — Ambulatory Visit (HOSPITAL_COMMUNITY): Payer: Self-pay | Admitting: Psychiatry

## 2010-08-02 ENCOUNTER — Ambulatory Visit (HOSPITAL_COMMUNITY): Payer: Self-pay | Admitting: Psychiatry

## 2010-08-21 ENCOUNTER — Ambulatory Visit (HOSPITAL_COMMUNITY): Payer: Self-pay | Admitting: Psychiatry

## 2010-08-29 ENCOUNTER — Telehealth: Payer: Self-pay | Admitting: Family Medicine

## 2010-09-04 ENCOUNTER — Encounter: Payer: Self-pay | Admitting: Family Medicine

## 2010-09-04 ENCOUNTER — Ambulatory Visit (HOSPITAL_COMMUNITY): Payer: Self-pay | Admitting: Psychiatry

## 2010-09-08 ENCOUNTER — Ambulatory Visit: Payer: Self-pay | Admitting: Family Medicine

## 2010-09-11 LAB — CONVERTED CEMR LAB
ALT: 27 units/L (ref 0–35)
AST: 31 units/L (ref 0–37)
Albumin: 4.4 g/dL (ref 3.5–5.2)
Alkaline Phosphatase: 99 units/L (ref 39–117)
BUN: 16 mg/dL (ref 6–23)
Bilirubin, Direct: 0.1 mg/dL (ref 0.0–0.3)
CO2: 29 meq/L (ref 19–32)
Calcium: 9.5 mg/dL (ref 8.4–10.5)
Chloride: 96 meq/L (ref 96–112)
Cholesterol: 200 mg/dL (ref 0–200)
Creatinine, Ser: 0.84 mg/dL (ref 0.40–1.20)
Glucose, Bld: 276 mg/dL — ABNORMAL HIGH (ref 70–99)
HDL: 65 mg/dL (ref >39)
Hgb A1c MFr Bld: 13.2 % — ABNORMAL HIGH (ref <5.7)
Indirect Bilirubin: 0.3 mg/dL (ref 0.0–0.9)
LDL Cholesterol: 108 mg/dL — ABNORMAL HIGH (ref 0–99)
Potassium: 4.7 meq/L (ref 3.5–5.3)
Sodium: 137 meq/L (ref 135–145)
TSH: 1.315 microintl units/mL (ref 0.350–4.500)
Total Bilirubin: 0.4 mg/dL (ref 0.3–1.2)
Total CHOL/HDL Ratio: 3.1
Total Protein: 8 g/dL (ref 6.0–8.3)
Triglycerides: 137 mg/dL (ref <150)
VLDL: 27 mg/dL (ref 0–40)

## 2010-09-13 ENCOUNTER — Ambulatory Visit (HOSPITAL_COMMUNITY): Payer: Self-pay | Admitting: Psychiatry

## 2010-09-26 ENCOUNTER — Telehealth: Payer: Self-pay | Admitting: Family Medicine

## 2010-09-27 ENCOUNTER — Telehealth: Payer: Self-pay | Admitting: Family Medicine

## 2010-10-03 ENCOUNTER — Encounter: Payer: Self-pay | Admitting: Family Medicine

## 2010-10-04 ENCOUNTER — Ambulatory Visit (HOSPITAL_COMMUNITY): Admit: 2010-10-04 | Payer: Self-pay | Admitting: Psychiatry

## 2010-10-10 ENCOUNTER — Ambulatory Visit (HOSPITAL_COMMUNITY)
Admission: RE | Admit: 2010-10-10 | Discharge: 2010-10-10 | Payer: Self-pay | Source: Home / Self Care | Attending: Psychiatry | Admitting: Psychiatry

## 2010-10-15 ENCOUNTER — Encounter: Payer: Self-pay | Admitting: Family Medicine

## 2010-10-24 ENCOUNTER — Telehealth: Payer: Self-pay | Admitting: Family Medicine

## 2010-10-24 ENCOUNTER — Emergency Department (HOSPITAL_COMMUNITY)
Admission: EM | Admit: 2010-10-24 | Discharge: 2010-10-24 | Payer: Self-pay | Source: Home / Self Care | Admitting: Emergency Medicine

## 2010-10-24 NOTE — Miscellaneous (Signed)
Summary: Orders Update  Clinical Lists Changes  Orders: Added new Test order of Radiology other (Radiology Other) - Signed

## 2010-10-24 NOTE — Assessment & Plan Note (Signed)
Summary: office visit   Vital Signs:  Patient profile:   70 year old female Menstrual status:  hysterectomy Height:      67.5 inches Weight:      308.50 pounds BMI:     47.78 O2 Sat:      93 % Pulse rate:   83 / minute Pulse rhythm:   regular Resp:     16 per minute BP sitting:   124 / 62  (left arm) Cuff size:   xl  Vitals Entered By: Tula Nakayama MD (May 16, 2010 10:39 AM)  Nutrition Counseling: Patient's BMI is greater than 25 and therefore counseled on weight management options. CC: Follow up chronic problems   Primary Care Provider:  Tula Nakayama MD  CC:  Follow up chronic problems.  History of Present Illness: Reports  thatshe has nopt been doing very well. She states her blood sugars were doing well , but have got off track again. Her eating habits have not changed. She does see an endocrinologist about her blood sugaRS AT THIS TIME. Denies recent fever or chills. Denies sinus pressure, nasal congestion , ear pain or sore throat. Denies chest congestion, or cough productive of sputum. Denies chest pain, palpitations, PND, orthopnea or leg swelling. Denies abdominal pain, nausea, vomitting, diarrhea or constipation. Denies change in bowel movements or bloody stool. Denies dysuria , frequency, incontinence or hesitancy.  Denies headaches, vertigo, seizures.  Denies  rash, lesions, or itch.     Current Medications (verified): 1)  Gabapentin 300 Mg  Caps (Gabapentin) .... Take 1 Tablet By Mouth Three Times A Day 2)  Furosemide 40 Mg  Tabs (Furosemide) .... Take 1 Tablet By Mouth Every Morning 3)  Alprazolam 0.5 Mg  Tabs (Alprazolam) .... 1/2 Tab in Am, 1/2 Tab At [employer], 2 Tabs At Bedtime 4)  Aspirin 81 Mg  Tbec (Aspirin) .... Take 1 Tablet By Mouth Once A Day 5)  Amlodipine Besylate 10 Mg  Tabs (Amlodipine Besylate) .... One Tab By Mouth Once Daily 6)  Humalog Mix 75/25 75-25 %  Susp (Insulin Lispro Prot & Lispro) .... Inject 10 Units Sub Q in Am and 25  Units in Pm 7)  Lovastatin 40 Mg Tabs (Lovastatin) .... Take 2 Tabs Every Night At Bedtime 8)  Flexeril 10 Mg Tabs (Cyclobenzaprine Hcl) .... Take 1 Tab By Mouth At Bedtime 9)  Centrum Silver  Tabs (Multiple Vitamins-Minerals) .... Take 1 Tablet By Mouth Once A Day 10)  Bd Insulin Syringe Ultrafine 31g X 5/16" 0.3 Ml Misc (Insulin Syringe-Needle U-100) .... Uad 11)  Benazepril Hcl 20 Mg Tabs (Benazepril Hcl) .... One Tab By Mouth Qd 12)  Bd Insulin Syringe Ultrafine 30g X 1/2" 1 Ml Misc (Insulin Syringe-Needle U-100) .... Uad 13)  Zoloft 50 Mg Tabs (Sertraline Hcl) .... Take 1 Tablet By Mouth Three Times A Day in The Am 14)  B Complex-B12  Tabs (B Complex Vitamins) .... Take 1 Tablet By Mouth Once A Day 15)  Diclofenac Sodium 75 Mg Tbec (Diclofenac Sodium) .... Take 1 Two Times A Day As Needed For Knee Pain 16)  Lifescan Unistik Ii Lancets  Misc (Lancets) .... Two Times A Day Testing 17)  Onetouch Ultra Test  Strp (Glucose Blood) .... For Two Times A Day Testing 18)  Humalog Mix 50/50 50-50 % Susp (Insulin Lispro Prot & Lispro) .... Take 40 Units After Supper Everynight 19)  Hydroxyzine Hcl 25 Mg Tabs (Hydroxyzine Hcl) .... Take 1 Tab By Mouth At Bedtime As  Needed 20)  Clotrimazole-Betamethasone 1-0.05 % Crea (Clotrimazole-Betamethasone) .... Apply Twice Daily To Rash On Leg As Needed 21)  Janumet 50-1000 Mg Tabs (Sitagliptin-Metformin Hcl) .... Take 1 Tablet By Mouth Once A Day  Allergies (verified): 1)  ! Darvocet 2)  ! Penicillin 3)  ! Prednisone  Review of Systems      See HPI General:  Complains of fatigue. Eyes:  Denies blurring and discharge. MS:  Complains of joint pain, low back pain, mid back pain, muscle weakness, and stiffness; chronic. Neuro:  Complains of tremors; essential tremor worsens with intention affecting both hands,left worse than right x 6 months, progressing. Endo:  Complains of excessive hunger and excessive thirst; uncontrolled widely fluctuating blood sugars on  review of record brought to the office. Heme:  Denies abnormal bruising and bleeding. Allergy:  Complains of seasonal allergies; denies hives or rash and itching eyes.  Physical Exam  General:  Well-developed,obese,in no acute distress; alert,appropriate and cooperative throughout examination HEENT: No facial asymmetry,  EOMI, No sinus tenderness, TM's Clear, oropharynx  pink and moist.   Chest: Clear to auscultation bilaterally.  CVS: S1, S2, No murmurs, No S3.   Abd: Soft, Nontender.  MS: decreased  ROM spine, hips, shoulders and knees.  Ext: No edema.   CNS: CN 2-12 intact, power tone and sensation normal throughout. tremor of hands noted when specifically focused ion the extremities  Skin: Intact, no visible lesions or rashes.  Psych: Good eye contact, normal affect.  Memory fair, not anxious or depressed appearing.    Impression & Recommendations:  Problem # 1:  TREMOR (ICD-781.0) Assessment Deteriorated  Orders: Neurology Referral (Neuro) Neurology Referral (Neuro)  Problem # 2:  ANXIETY (ICD-300.00) Assessment: Unchanged  Her updated medication list for this problem includes:    Alprazolam 0.5 Mg Tabs (Alprazolam) .Marland Kitchen... 1/2 tab in am, 1/2 tab at noon, 2 tabs at bedtime    Zoloft 50 Mg Tabs (Sertraline hcl) .Marland Kitchen... Take 1 tablet by mouth three times a day in the am    Hydroxyzine Hcl 25 Mg Tabs (Hydroxyzine hcl) .Marland Kitchen... Take 1 tab by mouth at bedtime as needed  Problem # 3:  DEPRESSION (ICD-311) Assessment: Improved  Her updated medication list for this problem includes:    Alprazolam 0.5 Mg Tabs (Alprazolam) .Marland Kitchen... 1/2 tab in am, 1/2 tab at noon, 2 tabs at bedtime    Zoloft 50 Mg Tabs (Sertraline hcl) .Marland Kitchen... Take 1 tablet by mouth three times a day in the am    Hydroxyzine Hcl 25 Mg Tabs (Hydroxyzine hcl) .Marland Kitchen... Take 1 tab by mouth at bedtime as needed  Problem # 4:  HYPERTENSION (ICD-401.9) Assessment: Improved  Her updated medication list for this problem includes:     Furosemide 40 Mg Tabs (Furosemide) .Marland Kitchen... Take 1 tablet by mouth every morning    Amlodipine Besylate 10 Mg Tabs (Amlodipine besylate) ..... One tab by mouth once daily    Benazepril Hcl 20 Mg Tabs (Benazepril hcl) ..... One tab by mouth qd  Orders: T-Basic Metabolic Panel (99991111)  BP today: 124/62 Prior BP: 134/72 (03/14/2010)  Labs Reviewed: K+: 4.5 (01/26/2010) Creat: : 0.84 (01/26/2010)   Chol: 199 (01/26/2010)   HDL: 71 (01/26/2010)   LDL: 109 (01/26/2010)   TG: 93 (01/26/2010)  Problem # 5:  IDDM (ICD-250.01) Assessment: Unchanged  The following medications were removed from the medication list:    Janumet 50-1000 Mg Tabs (Sitagliptin-metformin hcl) .Marland Kitchen... Take 1 tablet by mouth once a day Her updated medication list  for this problem includes:    Aspirin 81 Mg Tbec (Aspirin) .Marland Kitchen... Take 1 tablet by mouth once a day    Humalog Mix 75/25 75-25 % Susp (Insulin lispro prot & lispro) ..... Inject 10 units sub q in am and 25 units in pm    Benazepril Hcl 20 Mg Tabs (Benazepril hcl) ..... One tab by mouth qd    Humalog Mix 50/50 50-50 % Susp (Insulin lispro prot & lispro) .Marland Kitchen... Take 40 units after supper everynight    Humalog Mix 75/25 75-25 % Susp (Insulin lispro prot & lispro) ..... Sliding scale coverage pre breakfast per endocrine    Janumet 50-1000 Mg Tabs (Sitagliptin-metformin hcl) .Marland Kitchen... Take 1 capsule by mouth two times a day  Orders: T- Hemoglobin A1C [id number])  Labs Reviewed: Creat: 0.84 (01/26/2010)    Reviewed HgBA1c results: 11.0 (01/26/2010)  10.8 (08/24/2009)  Problem # 6:  HYPERLIPIDEMIA (ICD-272.4) Assessment: Comment Only  Her updated medication list for this problem includes:    Lovastatin 40 Mg Tabs (Lovastatin) .Marland Kitchen... Take 2 tabs every night at bedtime  Orders: T-Hepatic Function [phone]) T-Lipid Profile [id number])  Labs Reviewed: SGOT: 25 (01/26/2010)   SGPT: 25 (01/26/2010)   HDL:71 (01/26/2010), 62 (10/26/2009)  LDL:109  (01/26/2010), 107 (10/26/2009)  Chol:199 (01/26/2010), 191 (10/26/2009)  Trig:93 (01/26/2010), 111 (10/26/2009)  Complete Medication List: 1)  Gabapentin 300 Mg Caps (Gabapentin) .... Take 1 tablet by mouth three times a day 2)  Furosemide 40 Mg Tabs (Furosemide) .... Take 1 tablet by mouth every morning 3)  Alprazolam 0.5 Mg Tabs (Alprazolam) .... 1/2 tab in am, 1/2 tab at noon, 2 tabs at bedtime 4)  Aspirin 81 Mg Tbec (Aspirin) .... Take 1 tablet by mouth once a day 5)  Amlodipine Besylate 10 Mg Tabs (Amlodipine besylate) .... One tab by mouth once daily 6)  Humalog Mix 75/25 75-25 % Susp (Insulin lispro prot & lispro) .... Inject 10 units sub q in am and 25 units in pm 7)  Lovastatin 40 Mg Tabs (Lovastatin) .... Take 2 tabs every night at bedtime 8)  Flexeril 10 Mg Tabs (Cyclobenzaprine hcl) .... Take 1 tab by mouth at bedtime 9)  Centrum Silver Tabs (Multiple vitamins-minerals) .... Take 1 tablet by mouth once a day 10)  Bd Insulin Syringe Ultrafine 30g X 1/2" 1 Ml Misc (Insulin syringe-needle u-100) .... Uad 11)  Benazepril Hcl 20 Mg Tabs (Benazepril hcl) .... One tab by mouth qd 12)  Zoloft 50 Mg Tabs (Sertraline hcl) .... Take 1 tablet by mouth three times a day in the am 13)  B Complex-b12 Tabs (B complex vitamins) .... Take 1 tablet by mouth once a day 14)  Diclofenac Sodium 75 Mg Tbec (Diclofenac sodium) .... Take 1 two times a day as needed for knee pain 15)  Lifescan Unistik Ii Lancets Misc (Lancets) .... Two times a day testing 16)  Onetouch Ultra Test Strp (Glucose blood) .... For two times a day testing 17)  Humalog Mix 50/50 50-50 % Susp (Insulin lispro prot & lispro) .... Take 40 units after supper everynight 18)  Hydroxyzine Hcl 25 Mg Tabs (Hydroxyzine hcl) .... Take 1 tab by mouth at bedtime as needed 19)  Clotrimazole-betamethasone 1-0.05 % Crea (Clotrimazole-betamethasone) .... Apply twice daily to rash on leg as needed 20)  Humalog Mix 75/25 75-25 % Susp (Insulin lispro  prot & lispro) .... Sliding scale coverage pre breakfast per endocrine 21)  Janumet 50-1000 Mg Tabs (Sitagliptin-metformin hcl) .... Take 1 capsule by mouth two times  a day  Other Orders: T-TSH [phone]) T-Vitamin D (25-Hydroxy) [id number])  Patient Instructions: 1)  F/U in 6 weekss. 2)  INCREASE the dose of janumet to ONE tWICE dAILY, I will also let Dr Jaci Standard clarke know.  3)  BMP prior to visit, ICD-9: 4)  Hepatic Panel prior to visit, ICD-9: 5)  Lipid Panel prior to visit, ICD-9: 6)  TSH prior to visit, ICD-9:   fasting today 7)  HbgA1C prior to visit, ICD-9: 8)  vitamin d 9)  You will be referred to neurologist about your tremor Prescriptions: LOVASTATIN 40 MG TABS (LOVASTATIN) Take 2 tabs every night at bedtime  #60 Each x 4   Entered by:   Kate Sable LPN   Authorized by:   Tula Nakayama MD   Signed by:   Kate Sable LPN on 075-GRM   Method used:   Printed then faxed to ...       [facility] (retail)       [address], [place]  [address]       Ph: [phone]       Fax: [phone]   RxID:   [id number] ALPRAZOLAM 0.5 MG  TABS (ALPRAZOLAM) 1/2 tab in AM, 1/2 tab at noon, 2 tabs at bedtime  #90 x 4   Entered by:   Kate Sable LPN   Authorized by:   Tula Nakayama MD   Signed by:   Kate Sable LPN on 075-GRM   Method used:   Printed then faxed to ...       [facility] (retail)       [address], [place]  [address]       Ph: [phone]       Fax: [phone]   [id number] GABAPENTIN 300 MG  CAPS (GABAPENTIN) Take 1 tablet by mouth three times a day  #90 Each x 4   Entered by:   Kate Sable LPN   Authorized by:   Tula Nakayama MD   Signed by:   Kate Sable LPN on 075-GRM   Method used:   Printed then faxed to ...       [facility] (retail)       [address], [place]  [address]       Ph:  [phone]       Fax: [phone]   RxID:   [id number] Idamae Schuller II LANCETS  MISC (LANCETS) two times a day testing  #100 x 5   Entered by:   Kate Sable LPN   Authorized by:   Tula Nakayama MD   Signed by:   Kate Sable LPN on 075-GRM   Method used:   Electronically to        [facility]* (retail)       [address], [place]  [address]       Ph: [phone]       Fax: [phone]   RxID:   [id number] BD INSULIN SYRINGE ULTRAFINE 30G X 1/2" 1 ML MISC (INSULIN SYRINGE-NEEDLE U-100) UAD  #100 x 5   Entered  by:   Kate Sable LPN   Authorized by:   Tula Nakayama MD   Signed by:   Kate Sable LPN on 075-GRM   Method used:   Electronically to        [facility] (retail)       [address], [place]  [address]       Ph: [phone]       Fax: [phone]   RxID:   [id number] Donald Siva TEST  STRP (GLUCOSE BLOOD) For two times a day testing  #100 x 5   Entered by:   Kate Sable LPN   Authorized by:   Tula Nakayama MD   Signed by:   Kate Sable LPN on 075-GRM   Method used:   Electronically to        [facility] (retail)       [address], [place]  [address]       Ph: [phone]       Fax: [phone]   RxID:   [id number] JANUMET 50-1000 MG TABS (SITAGLIPTIN-METFORMIN HCL) Take 1 capsule by mouth two times a day  #60 x 0   Entered and Authorized by:   Tula Nakayama MD   Signed by:   Tula Nakayama MD on 05/16/2010   Method used:   Historical   RxIDOT:[id number]

## 2010-10-24 NOTE — Letter (Signed)
Summary: [employer] release  [employer] release   Imported By: Dierdre Harness 07/19/2010 12:54:58  _____________________________________________________________________  External Attachment:    Type:   Image     Comment:   External Document

## 2010-10-24 NOTE — Progress Notes (Signed)
Summary: DIABETES DOCTOR  Phone Note Call from Patient   Summary of Call: LAST TIME SHE WAS IN [facility] HAD TOLD HER ABOUT A DIABETES DOCTOR AND WAS WONDERING ABOUT THAT DOCTOR WHO HE IS SUGAR IS UP  CALL BACK AT [phone] Initial call taken by: Dierdre Harness,  August 30, 2008 1:07 PM  Follow-up for Phone Call        advise the diabetes speciaist is Dr Carmela Rima, if she agrees pls refer for uncontrolled blood sugars Follow-up by: Tula Nakayama MD,  August 30, 2008 3:11 PM  Additional Follow-up for Phone Call Additional follow up Details #1::        pt has appt with dr,morayati on 09/14/2008 2:00. pt notified  Additional Follow-up by: Lenn Cal,  August 30, 2008 4:04 PM

## 2010-10-24 NOTE — Progress Notes (Signed)
Summary: test results  Phone Note Call from Patient   Summary of Call: needs results for her sleep study please call back at [phone] Initial call taken by: Dierdre Harness,  March 21, 2009 2:18 PM  Follow-up for Phone Call        Let me know what I need to tell her about it and I'll call her back Follow-up by: Kate Sable,  March 22, 2009 10:47 AM  Additional Follow-up for Phone Call Additional follow up Details #1::        she has moderate sleep apnea and needs to use a cpap machine, she should contact Dr. Freddie Apley office to have this set up, he writes the script for theequipment. Additional Follow-up by: Tula Nakayama MD,  March 22, 2009 1:01 PM    Additional Follow-up for Phone Call Additional follow up Details #2::    Returned call x 2 times this morning, busy signal Follow-up by: Kate Sable,  March 23, 2009 10:50 AM  Additional Follow-up for Phone Call Additional follow up Details #3:: Details for Additional Follow-up Action Taken: Patient aware Additional Follow-up by: Kate Sable,  March 23, 2009 2:36 PM

## 2010-10-24 NOTE — Progress Notes (Signed)
Summary: [facility]   Imported By: Dierdre Harness 12/22/2009 10:58:05  _____________________________________________________________________  External Attachment:    Type:   Image     Comment:   External Document

## 2010-10-24 NOTE — Assessment & Plan Note (Signed)
Summary: f up   Vital Signs:  Patient profile:   70 year old female Menstrual status:  hysterectomy Height:      67.5 inches Weight:      305.75 pounds BMI:     47.35 O2 Sat:      94 % on Room air Pulse rate:   73 / minute Pulse rhythm:   regular Resp:     16 per minute BP sitting:   124 / 70  (left arm)  Vitals Entered By: Baldomero Lamy LPN (October  7, 624THL 10:59 AM)  Nutrition Counseling: Patient's BMI is greater than 25 and therefore counseled on weight management options.  O2 Flow:  Room air CC: follow-up visit Is Patient Diabetic? Yes   Primary Care Provider:  Tula Nakayama MD  CC:  follow-up visit.  History of Present Illness: Pt has multiple complaints about her care at the mental health. She feels as though her provider is not interested in her, does not care, she has short visits with little to no therapy.She reports that her depresion is worsening and she would like toestablish at a new facility. She denies suicidal or homicidal ideation. She denies any recentfever or chills. She still reports uncontrolled blood sugars withpoor eating habits. She will continue to follow with endocrine about this. she has chronic back and knee pain alspo bilateral hip pain. She denies any recent fever or chill, head or chesrt congestion.  Current Medications (verified): 1)  Gabapentin 300 Mg  Caps (Gabapentin) .... Take 1 Tablet By Mouth Three Times A Day 2)  Furosemide 40 Mg  Tabs (Furosemide) .... Take 1 Tablet By Mouth Every Morning 3)  Alprazolam 0.5 Mg  Tabs (Alprazolam) .... 1/2 Tab in Am, 1/2 Tab At [employer], 2 Tabs At Bedtime 4)  Aspirin 81 Mg  Tbec (Aspirin) .... Take 1 Tablet By Mouth Once A Day 5)  Amlodipine Besylate 10 Mg  Tabs (Amlodipine Besylate) .... One Tab By Mouth Once Daily 6)  Humalog Mix 75/25 75-25 %  Susp (Insulin Lispro Prot & Lispro) .... Inject 10 Units Sub Q in Am and 25 Units in Pm 7)  Lovastatin 40 Mg Tabs (Lovastatin) .... Take 2 Tabs Every Night At  Bedtime 8)  Flexeril 10 Mg Tabs (Cyclobenzaprine Hcl) .... Take 1 Tab By Mouth At Bedtime 9)  Centrum Silver  Tabs (Multiple Vitamins-Minerals) .... Take 1 Tablet By Mouth Once A Day 10)  Bd Insulin Syringe Ultrafine 30g X 1/2" 1 Ml Misc (Insulin Syringe-Needle U-100) .... Uad 11)  Benazepril Hcl 20 Mg Tabs (Benazepril Hcl) .... One Tab By Mouth Qd 12)  Zoloft 50 Mg Tabs (Sertraline Hcl) .... Take 1 Tablet By Mouth Three Times A Day in The Am 13)  Diclofenac Sodium 75 Mg Tbec (Diclofenac Sodium) .... Take 1 Two Times A Day As Needed For Knee Pain 14)  Lifescan Unistik Ii Lancets  Misc (Lancets) .... Two Times A Day Testing 15)  Onetouch Ultra Test  Strp (Glucose Blood) .... For Two Times A Day Testing 16)  Humalog Mix 50/50 50-50 % Susp (Insulin Lispro Prot & Lispro) .... Take 40 Units After Supper Everynight 17)  Hydroxyzine Hcl 25 Mg Tabs (Hydroxyzine Hcl) .... Take 1 Tab By Mouth At Bedtime As Needed 18)  Clotrimazole-Betamethasone 1-0.05 % Crea (Clotrimazole-Betamethasone) .... Apply Twice Daily To Rash On Leg As Needed 19)  Humalog Mix 75/25 75-25 % Susp (Insulin Lispro Prot & Lispro) .... Sliding Scale Coverage Pre Breakfast Per Endocrine 20)  Janumet  50-1000 Mg Tabs (Sitagliptin-Metformin Hcl) .... Take 1 Capsule By Mouth Two Times A Day  Allergies (verified): 1)  ! Darvocet 2)  ! Penicillin 3)  ! Prednisone  Review of Systems      See HPI General:  Complains of fatigue, malaise, and sleep disorder. Eyes:  Denies blurring and discharge. ENT:  Denies hoarseness, nasal congestion, postnasal drainage, and sinus pressure. CV:  Complains of difficulty breathing while lying down and shortness of breath with exertion; denies chest pain or discomfort, palpitations, and swelling of feet. Resp:  Denies cough and sputum productive. GI:  Denies abdominal pain, constipation, diarrhea, nausea, and vomiting. GU:  Denies dysuria and urinary frequency. MS:  Complains of joint pain, low back pain,  mid back pain, and stiffness. Derm:  Denies itching, lesion(s), and rash. Neuro:  Complains of memory loss. Psych:  Complains of anxiety, depression, and mental problems; denies suicidal thoughts/plans, thoughts of violence, and unusual visions or sounds. Endo:  Denies excessive thirst and excessive urination. Heme:  Denies abnormal bruising and bleeding. Allergy:  Complains of seasonal allergies; denies hives or rash and itching eyes.  Physical Exam  General:  Well-developed,obese,in no acute distress; alert,appropriate and cooperative throughout examination HEENT: No facial asymmetry,  EOMI, No sinus tenderness, TM's Clear, oropharynx  pink and moist.   Chest: Clear to auscultation bilaterally.  CVS: S1, S2, No murmurs, No S3.   Abd: Soft, Nontender.  MS: decreased  ROM spine, hips, shoulders and knees.  Ext: No edema.   CNS: CN 2-12 intact, power tone and sensation normal throughout. tremor of hands noted when specifically focused ion the extremities  Skin: Intact, no visible lesions or rashes.  Psych: Good eye contact, normal affect.  Memory fair, not anxious or depressed appearing.    Impression & Recommendations:  Problem # 1:  BACK PAIN, CHRONIC (ICD-724.5) Assessment Unchanged  Her updated medication list for this problem includes:    Aspirin 81 Mg Tbec (Aspirin) .Marland Kitchen... Take 1 tablet by mouth once a day    Flexeril 10 Mg Tabs (Cyclobenzaprine hcl) .Marland Kitchen... Take 1 tab by mouth at bedtime    Diclofenac Sodium 75 Mg Tbec (Diclofenac sodium) .Marland Kitchen... Take 1 two times a day as needed for knee pain  Problem # 2:  DEPRESSION (ICD-311) Assessment: Deteriorated  Her updated medication list for this problem includes:    Alprazolam 0.5 Mg Tabs (Alprazolam) .Marland Kitchen... 1/2 tab in am, 1/2 tab at noon, 2 tabs at bedtime    Zoloft 50 Mg Tabs (Sertraline hcl) .Marland Kitchen... Take 1 tablet by mouth three times a day in the am    Hydroxyzine Hcl 25 Mg Tabs (Hydroxyzine hcl) .Marland Kitchen... Take 1 tab by mouth at  bedtime as needed  Orders: Psychiatric Referral (Psych)  Problem # 3:  IDDM (ICD-250.01) Assessment: Unchanged  Her updated medication list for this problem includes:    Aspirin 81 Mg Tbec (Aspirin) .Marland Kitchen... Take 1 tablet by mouth once a day    Humalog Mix 75/25 75-25 % Susp (Insulin lispro prot & lispro) ..... Inject 10 units sub q in am and 25 units in pm    Benazepril Hcl 20 Mg Tabs (Benazepril hcl) ..... One tab by mouth qd    Humalog Mix 50/50 50-50 % Susp (Insulin lispro prot & lispro) .Marland Kitchen... Take 40 units after supper everynight    Humalog Mix 75/25 75-25 % Susp (Insulin lispro prot & lispro) ..... Sliding scale coverage pre breakfast per endocrine    Janumet 50-1000 Mg Tabs (Sitagliptin-metformin  hcl) ..... Take 1 capsule by mouth two times a day Patient advised to reduce carbs and sweets, commit to regular physical activity, take meds as prescribed, test blood sugars as directed, and attempt to lose weight , to improve blood sugar control.  Labs Reviewed: Creat: 0.72 (05/16/2010)    Reviewed HgBA1c results: 10.7 (05/16/2010)  11.0 (01/26/2010)  Problem # 4:  HYPERTENSION (ICD-401.9) Assessment: Unchanged  Her updated medication list for this problem includes:    Furosemide 40 Mg Tabs (Furosemide) .Marland Kitchen... Take 1 tablet by mouth every morning    Amlodipine Besylate 10 Mg Tabs (Amlodipine besylate) ..... One tab by mouth once daily    Benazepril Hcl 20 Mg Tabs (Benazepril hcl) ..... One tab by mouth qd  Orders: T-Basic Metabolic Panel (99991111)  BP today: 124/70 Prior BP: 124/62 (05/16/2010)  Labs Reviewed: K+: 4.1 (05/16/2010) Creat: : 0.72 (05/16/2010)   Chol: 192 (05/16/2010)   HDL: 62 (05/16/2010)   LDL: 102 (05/16/2010)   TG: 142 (05/16/2010)  Complete Medication List: 1)  Gabapentin 300 Mg Caps (Gabapentin) .... Take 1 tablet by mouth three times a day 2)  Furosemide 40 Mg Tabs (Furosemide) .... Take 1 tablet by mouth every morning 3)  Alprazolam 0.5 Mg Tabs  (Alprazolam) .... 1/2 tab in am, 1/2 tab at noon, 2 tabs at bedtime 4)  Aspirin 81 Mg Tbec (Aspirin) .... Take 1 tablet by mouth once a day 5)  Amlodipine Besylate 10 Mg Tabs (Amlodipine besylate) .... One tab by mouth once daily 6)  Humalog Mix 75/25 75-25 % Susp (Insulin lispro prot & lispro) .... Inject 10 units sub q in am and 25 units in pm 7)  Lovastatin 40 Mg Tabs (Lovastatin) .... Take 2 tabs every night at bedtime 8)  Flexeril 10 Mg Tabs (Cyclobenzaprine hcl) .... Take 1 tab by mouth at bedtime 9)  Centrum Silver Tabs (Multiple vitamins-minerals) .... Take 1 tablet by mouth once a day 10)  Bd Insulin Syringe Ultrafine 30g X 1/2" 1 Ml Misc (Insulin syringe-needle u-100) .... Uad 11)  Benazepril Hcl 20 Mg Tabs (Benazepril hcl) .... One tab by mouth qd 12)  Zoloft 50 Mg Tabs (Sertraline hcl) .... Take 1 tablet by mouth three times a day in the am 13)  Diclofenac Sodium 75 Mg Tbec (Diclofenac sodium) .... Take 1 two times a day as needed for knee pain 14)  Lifescan Unistik Ii Lancets Misc (Lancets) .... Two times a day testing 15)  Onetouch Ultra Test Strp (Glucose blood) .... For two times a day testing 16)  Humalog Mix 50/50 50-50 % Susp (Insulin lispro prot & lispro) .... Take 40 units after supper everynight 17)  Hydroxyzine Hcl 25 Mg Tabs (Hydroxyzine hcl) .... Take 1 tab by mouth at bedtime as needed 18)  Clotrimazole-betamethasone 1-0.05 % Crea (Clotrimazole-betamethasone) .... Apply twice daily to rash on leg as needed 19)  Humalog Mix 75/25 75-25 % Susp (Insulin lispro prot & lispro) .... Sliding scale coverage pre breakfast per endocrine 20)  Janumet 50-1000 Mg Tabs (Sitagliptin-metformin hcl) .... Take 1 capsule by mouth two times a day  Other Orders: T- Hemoglobin A1C [id number])  Patient Instructions: 1)  f/U in  the end of   december or early January. 2)  You willbe referred to [facility] for eval 3)  BMP prior to visit, ICD-9: 4)  HbgA1C prior to visit,  ICD-9: 5)  Check your blood sugars regularly. If your readings are usually above250: or below 70 you should  contact  the endocrinoligst 6)  It is important that you exercise regularly at least 20 minutes 5 times a week. If you develop chest pain, have severe difficulty breathing, or feel very tired , stop exercising immediately and seek medical attention. 7)  You need to lose weight. Consider a lower calorie diet and regular exercise.  8)  Your blood pressure is great , no med changes Prescriptions: ALPRAZOLAM 0.5 MG  TABS (ALPRAZOLAM) 1/2 tab in AM, 1/2 tab at noon, 2 tabs at bedtime  #90 x 3   Entered by:   Baldomero Lamy LPN   Authorized by:   Tula Nakayama MD   Signed by:   Baldomero Lamy LPN on 624THL   Method used:   Printed then faxed to ...       [facility] (retail)       [address], [place]  [address]       Ph: [phone]       Fax: [phone]   RxID:   [id number] DICLOFENAC SODIUM 75 MG TBEC (DICLOFENAC SODIUM) take 1 two times a day as needed for knee pain  #60 x 3   Entered by:   Baldomero Lamy LPN   Authorized by:   Tula Nakayama MD   Signed by:   Baldomero Lamy LPN on 624THL   Method used:   Electronically to        [facility] (retail)       [address], [place]  [address]       Ph: [phone]       Fax: [phone]   RxID:   [id number] BENAZEPRIL HCL 20 MG TABS (BENAZEPRIL HCL) one tab by mouth qd  #30 Each x 3   Entered by:   Baldomero Lamy LPN   Authorized by:   Tula Nakayama MD   Signed by:   Baldomero Lamy LPN on 624THL   Method used:   Electronically to        [facility] (retail)       [address]       [place], [place]  [address]       Ph: [phone]       Fax: [phone]   RxID:   [id number]

## 2010-10-24 NOTE — Assessment & Plan Note (Signed)
Summary: office visit   Vital Signs:  Patient profile:   70 year old female Menstrual status:  hysterectomy Height:      67.5 inches Weight:      303.75 pounds BMI:     47.04 O2 Sat:      97 % Pulse rate:   80 / minute Pulse rhythm:   regular Resp:     16 per minute BP sitting:   140 / 68  (left arm) Cuff size:   xl  Vitals Entered By: Kate Sable LPN (May  5, 624THL [id number] AM)  Nutrition Counseling: Patient's BMI is greater than 25 and therefore counseled on weight management options. CC: Follow up chronic problems   Primary Care Provider:  Tula Nakayama MD  CC:  Follow up chronic problems.  History of Present Illness: Pt in for her ecview of heere chronic problems. The only ne report is that she is scheduled for surgery for urinary incontinence in june, she is looking fwd to this , as she is extremly disabled currently. She continues to eat erratically, adn to be mon compliant in her diabetes mx, her blood sugar control has worsened, and she agrees to attend diabetic classes. She denies any recent head or chest congestion oir symptoms of infection  Current Medications (verified): 1)  Calcium 500/d 500-200 Mg-Unit  Tabs (Calcium Carbonate-Vitamin D) .... Take 1 Tablet By Mouth Three Times A Day 2)  Gabapentin 300 Mg  Caps (Gabapentin) .... Take 1 Tablet By Mouth Three Times A Day 3)  Furosemide 40 Mg  Tabs (Furosemide) .... Take 1 Tablet By Mouth Every Morning 4)  Alprazolam 0.5 Mg  Tabs (Alprazolam) .... 1/2 Tab in Am, 1/2 Tab At [employer], 2 Tabs At Bedtime 5)  Glyburide 5 Mg  Tabs (Glyburide) .... Take 1 Tablet By Mouth Two Times A Day 6)  Aspirin 81 Mg  Tbec (Aspirin) .... Take 1 Tablet By Mouth Once A Day 7)  Lantus 100 Unit/ml  Soln (Insulin Glargine) .... Inject 50 Units Sub Q Twice Daily 8)  Amlodipine Besylate 10 Mg  Tabs (Amlodipine Besylate) .... One Tab By Mouth Once Daily 9)  Humalog Mix 75/25 75-25 %  Susp (Insulin Lispro Prot & Lispro) .... Inject 10 Units Sub Q in  Am and 25 Units in Pm 10)  Lovastatin 40 Mg Tabs (Lovastatin) .... Take 2 Tabs Every Night At Bedtime 11)  Flexeril 10 Mg Tabs (Cyclobenzaprine Hcl) .... Take 1 Tab By Mouth At Bedtime 12)  Centrum Silver  Tabs (Multiple Vitamins-Minerals) .... Take 1 Tablet By Mouth Once A Day 13)  Potassium 99 Mg Tabs (Potassium) .... Take 1 Tablet By Mouth Once A Day 14)  Bd Insulin Syringe Ultrafine 31g X 5/16" 0.3 Ml Misc (Insulin Syringe-Needle U-100) .... Uad 15)  Benazepril Hcl 20 Mg Tabs (Benazepril Hcl) .... One Tab By Mouth Qd 16)  Bd Insulin Syringe Ultrafine 30g X 1/2" 1 Ml Misc (Insulin Syringe-Needle U-100) .... Uad 17)  Zoloft 50 Mg Tabs (Sertraline Hcl) .... Take 1 Tablet By Mouth Three Times A Day in The Am 18)  B Complex-B12  Tabs (B Complex Vitamins) .... Take 1 Tablet By Mouth Once A Day 19)  Diclofenac Sodium 75 Mg Tbec (Diclofenac Sodium) .... Take 1 Two Times A Day As Needed For Knee Pain 20)  Lifescan Unistik Ii Lancets  Misc (Lancets) .... Two Times A Day Testing  Allergies (verified): 1)  ! Darvocet 2)  ! Penicillin 3)  ! Prednisone  Review  of Systems General:  Complains of fatigue and sleep disorder; denies chills and fever. Eyes:  Denies discharge, eye pain, and red eye. ENT:  Denies earache, hoarseness, nasal congestion, sinus pressure, and sore throat. CV:  Denies chest pain or discomfort, palpitations, and swelling of feet. Resp:  Complains of excessive snoring, hypersomnolence, and shortness of breath; denies cough and sputum productive; non compliant with cPAP. GI:  Complains of indigestion; denies abdominal pain, constipation, diarrhea, nausea, and vomiting blood. GU:  Complains of incontinence; pt for surgical intervention june 14, [facility]baum. MS:  Complains of joint pain, low back pain, mid back pain, muscle weakness, and stiffness. Derm:  Denies itching, lesion(s), and rash. Neuro:  Denies inability to speak, seizures, and sensation of room spinning. Psych:  Complains  of anxiety, depression, and mental problems; denies suicidal thoughts/plans, thoughts of violence, and unusual visions or sounds. Endo:  Complains of excessive thirst and excessive urination; te4sting very irregularly , on avg 10 times per month, sugars often over 270. Heme:  Denies abnormal bruising and bleeding. Allergy:  Denies hives or rash, itching eyes, and sneezing.  Physical Exam  General:  Well-developed,obesein no acute distress; alert,appropriate and cooperative throughout examination HEENT: No facial asymmetry,  EOMI, No sinus tenderness, TM's Clear, oropharynx  pink and moist.   Chest: Clear to auscultation bilaterally.  CVS: S1, S2, No murmurs, No S3.   Abd: Soft, Nontender. obese [id number]  ROM spine, hips, shoulders and knees.  Ext: No edema.   CNS: CN 2-12 intact, power tone and sensation normal throughout.   Skin: Intact, no visible lesions or rashes.  Psych: Good eye contact, normal affect.  Memory intact, not anxious or depressed appearing.    Impression & Recommendations:  Problem # 1:  HYPERTENSION (ICD-401.9) Assessment Deteriorated  Her updated medication list for this problem includes:    Furosemide 40 Mg Tabs (Furosemide) .Marland Kitchen... Take 1 tablet by mouth every morning    Amlodipine Besylate 10 Mg Tabs (Amlodipine besylate) ..... One tab by mouth once daily    Benazepril Hcl 20 Mg Tabs (Benazepril hcl) ..... One tab by mouth qd  BP today: 140/68 Prior BP: 130/60 (01/18/2010)  Labs Reviewed: K+: 4.4 (10/26/2009) Creat: : 0.76 (10/26/2009)   Chol: 191 (10/26/2009)   HDL: 62 (10/26/2009)   LDL: 107 (10/26/2009)   TG: 111 (10/26/2009)  Problem # 2:  IDDM (ICD-250.01) Assessment: Deteriorated  Her updated medication list for this problem includes:    Glyburide 5 Mg Tabs (Glyburide) .Marland Kitchen... Take 1 tablet by mouth two times a day    Aspirin 81 Mg Tbec (Aspirin) .Marland Kitchen... Take 1 tablet by mouth once a day    Lantus 100 Unit/ml Soln (Insulin glargine) ..... Inject  50 units sub q twice daily    Humalog Mix 75/25 75-25 % Susp (Insulin lispro prot & lispro) ..... Inject 10 units sub q in am and 25 units in pm    Benazepril Hcl 20 Mg Tabs (Benazepril hcl) ..... One tab by mouth qd  Orders: T- Hemoglobin A1C [id number])  Labs Reviewed: Creat: 0.76 (10/26/2009)    Reviewed HgBA1c results: 10.8 (08/24/2009)  9.5 (04/20/2009)  Problem # 3:  OSTEOARTHRITIS (ICD-715.90) Assessment: Deteriorated  Her updated medication list for this problem includes:    Aspirin 81 Mg Tbec (Aspirin) .Marland Kitchen... Take 1 tablet by mouth once a day    Diclofenac Sodium 75 Mg Tbec (Diclofenac sodium) .Marland Kitchen... Take 1 two times a day as needed for knee pain  Problem # 4:  URINARY  INCONTINENCE (ICD-788.30) Assessment: Comment Only for surgewry in June  Problem # 5:  DEPRESSION (ICD-311) Assessment: Deteriorated  Her updated medication list for this problem includes:    Alprazolam 0.5 Mg Tabs (Alprazolam) .Marland Kitchen... 1/2 tab in am, 1/2 tab at noon, 2 tabs at bedtime    Zoloft 50 Mg Tabs (Sertraline hcl) .Marland Kitchen... Take 1 tablet by mouth three times a day in the am, sees a counsellor regularly  Problem # 6:  HYPERLIPIDEMIA (ICD-272.4) Assessment: Unchanged  Her updated medication list for this problem includes:    Lovastatin 40 Mg Tabs (Lovastatin) .Marland Kitchen... Take 2 tabs every night at bedtime  Orders: T-Hepatic Function [id number]) T-Lipid Profile [id number])  Labs Reviewed: SGOT: 35 (10/26/2009)   SGPT: 33 (10/26/2009)   HDL:62 (10/26/2009), 71 (06/17/2009)  LDL:107 (10/26/2009), 99 (06/17/2009)  Chol:191 (10/26/2009), 192 (06/17/2009)  Trig:111 (10/26/2009), 112 (06/17/2009)  Complete Medication List: 1)  Calcium 500/d 500-200 Mg-unit Tabs (Calcium carbonate-vitamin d) .... Take 1 tablet by mouth three times a day 2)  Gabapentin 300 Mg Caps (Gabapentin) .... Take 1 tablet by mouth three times a day 3)  Furosemide 40 Mg Tabs (Furosemide) .... Take 1 tablet by mouth every morning 4)   Alprazolam 0.5 Mg Tabs (Alprazolam) .... 1/2 tab in am, 1/2 tab at noon, 2 tabs at bedtime 5)  Glyburide 5 Mg Tabs (Glyburide) .... Take 1 tablet by mouth two times a day 6)  Aspirin 81 Mg Tbec (Aspirin) .... Take 1 tablet by mouth once a day 7)  Lantus 100 Unit/ml Soln (Insulin glargine) .... Inject 50 units sub q twice daily 8)  Amlodipine Besylate 10 Mg Tabs (Amlodipine besylate) .... One tab by mouth once daily 9)  Humalog Mix 75/25 75-25 % Susp (Insulin lispro prot & lispro) .... Inject 10 units sub q in am and 25 units in pm 10)  Lovastatin 40 Mg Tabs (Lovastatin) .... Take 2 tabs every night at bedtime 11)  Flexeril 10 Mg Tabs (Cyclobenzaprine hcl) .... Take 1 tab by mouth at bedtime 12)  Centrum Silver Tabs (Multiple vitamins-minerals) .... Take 1 tablet by mouth once a day 13)  Potassium 99 Mg Tabs (Potassium) .... Take 1 tablet by mouth once a day 14)  Bd Insulin Syringe Ultrafine 31g X 5/16" 0.3 Ml Misc (Insulin syringe-needle u-100) .... Uad 15)  Benazepril Hcl 20 Mg Tabs (Benazepril hcl) .... One tab by mouth qd 16)  Bd Insulin Syringe Ultrafine 30g X 1/2" 1 Ml Misc (Insulin syringe-needle u-100) .... Uad 17)  Zoloft 50 Mg Tabs (Sertraline hcl) .... Take 1 tablet by mouth three times a day in the am 18)  B Complex-b12 Tabs (B complex vitamins) .... Take 1 tablet by mouth once a day 19)  Diclofenac Sodium 75 Mg Tbec (Diclofenac sodium) .... Take 1 two times a day as needed for knee pain 20)  Lifescan Unistik Ii Lancets Misc (Lancets) .... Two times a day testing  Other Orders: T-Basic Metabolic Panel ([id number]) T-CBC w/Diff [phone]) T- * Misc. Laboratory test [phone])  Patient Instructions: 1)  F/U in 6 weeks. 2)  You need to go for diabetic class at the health dept. 3)  BMP prior to visit, ICD-9: 4)  Hepatic Panel prior to visit, ICD-9: 5)  Lipid Panel prior to visit, ICD-9: 6)  CBC w/ Diff prior to visit, ICD-9:, and anemia panel today 7)  HbgA1C prior to visit,  ICD-9: 8)  Vitamin D

## 2010-10-24 NOTE — Assessment & Plan Note (Signed)
Summary: OV   Vital Signs:  Patient profile:   70 year old female Menstrual status:  hysterectomy Height:      67.5 inches Weight:      310.25 pounds BMI:     48.05 O2 Sat:      95 % Pulse rate:   83 / minute Pulse rhythm:   regular Resp:     16 per minute BP sitting:   130 / 72  (right arm) Cuff size:   xl  Vitals Entered By: Kate Sable (August 24, 2009 10:21 AM)  Nutrition Counseling: Patient's BMI is greater than 25 and therefore counseled on weight management options. CC: complaining of every joint in her body hurting Is Patient Diabetic? Yes   Primary Care Provider:  Tula Nakayama MD  CC:  complaining of every joint in her body hurting.  History of Present Illness: Reports  that she has not bee doing well. Denies recent fever or chills. Denies sinus pressure, nasal congestion , ear pain or sore throat. Denies chest congestion, or cough productive of sputum. Denies chest pain, palpitations, PND, orthopnea or leg swelling. Denies abdominal pain, nausea, vomitting, diarrhea or constipation. Denies change in bowel movements or bloody stool. Denies dysuria , frequency, incontinence or hesitancy. . Denies headaches, vertigo, seizures.  Denies  rash, lesions, or itch. Pt has not been following a diabetic diet, she does not commit to regular exercise, styating that joint pains limit this, she has gained weight , she is not testing her blood sugars regularly, she does however have her log with hwer.       Current Medications (verified): 1)  Calcium 500/d 500-200 Mg-Unit  Tabs (Calcium Carbonate-Vitamin D) .... Take 1 Tablet By Mouth Three Times A Day 2)  Gabapentin 300 Mg  Caps (Gabapentin) .... Take 1 Tablet By Mouth Three Times A Day 3)  Furosemide 40 Mg  Tabs (Furosemide) .... Take 1 Tablet By Mouth Every Morning 4)  Alprazolam 0.5 Mg  Tabs (Alprazolam) .... 1/2 Tab in Am, 1/2 Tab At [employer], 2 Tabs At Bedtime 5)  Glyburide 5 Mg  Tabs (Glyburide) .... Take 1  Tablet By Mouth Two Times A Day 6)  Aspirin 81 Mg  Tbec (Aspirin) .... Take 1 Tablet By Mouth Once A Day 7)  Lantus 100 Unit/ml  Soln (Insulin Glargine) .... Inject 50 Units Sub Q Twice Daily 8)  Amlodipine Besylate 10 Mg  Tabs (Amlodipine Besylate) .... One Tab By Mouth Once Daily 9)  Humalog Mix 75/25 75-25 %  Susp (Insulin Lispro Prot & Lispro) .... Inject 10 Units Sub Q in Am and 25 Units in Pm 10)  Lovastatin 40 Mg Tabs (Lovastatin) .... Take 2 Tabs Every Night At Bedtime 11)  Flexeril 10 Mg Tabs (Cyclobenzaprine Hcl) .... Take 1 Tab By Mouth At Bedtime 12)  Bd Uf Insulin Syringe 1 Ml 30g .... Uad 13)  Centrum Silver  Tabs (Multiple Vitamins-Minerals) .... Take 1 Tablet By Mouth Once A Day 14)  Potassium 99 Mg Tabs (Potassium) .... Take 1 Tablet By Mouth Once A Day 15)  Bd Insulin Syringe Ultrafine 31g X 5/16" 0.3 Ml Misc (Insulin Syringe-Needle U-100) .... Uad 16)  Benazepril Hcl 20 Mg Tabs (Benazepril Hcl) .... One Tab By Mouth Qd 17)  Bd Insulin Syringe Ultrafine 31g X 5/16" 1 Ml Misc (Insulin Syringe-Needle U-100) .... Uad 18)  Zoloft 50 Mg Tabs (Sertraline Hcl) .... Take 1 Tablet By Mouth Three Times A Day in The Am 19)  B  Complex-B12  Tabs (B Complex Vitamins) .... Take 1 Tablet By Mouth Once A Day  Allergies (verified): 1)  ! Darvocet 2)  ! Penicillin 3)  ! Prednisone  Review of Systems MS:  Complains of joint pain and stiffness; right shoulder pain with reduced mobility x 1 month, no recent [id number], no recall of similar problems with the shoulder. Window fell  on little finger approx 6 weeks ago, since then finger remains bent.. Neuro:  Complains of headaches, memory loss, poor balance, and tingling; denies seizures and sensation of room spinning. Endo:  Complains of excessive hunger, excessive thirst, and excessive urination; fasting sugars when checked are over 2509. Heme:  Denies abnormal bruising and bleeding. Allergy:  Complains of seasonal allergies.  Physical  Exam  General:  alert, well-hydrated, and overweight-appearing.  HEENT: No facial asymmetry,  EOMI, No sinus tenderness, TM's Clear, oropharynx  pink and moist.   Chest: Clear to auscultation bilaterally.  CVS: S1, S2, No murmurs, No S3.   Abd: Soft, Nontender.  MS: decreased  ROM spine, hips, shoulders and knees.  Ext: No edema.   CNS: CN 2-12 intact, power tone and sensation normal throughout.   Skin: Intact, no visible lesions or rashes.  Psych: Good eye contact, normal affect.  Memory loss, not anxious or depressed appearing.     Impression & Recommendations:  Problem # 1:  FINGER PAIN (ICD-729.5) Assessment Comment Only  Orders: Radiology other (Radiology Other)  Problem # 2:  SHOULDER PAIN, RIGHT (ICD-719.41) Assessment: Deteriorated  Her updated medication list for this problem includes:    Aspirin 81 Mg Tbec (Aspirin) .Marland Kitchen... Take 1 tablet by mouth once a day    Flexeril 10 Mg Tabs (Cyclobenzaprine hcl) .Marland Kitchen... Take 1 tab by mouth at bedtime    Ibuprofen 800 Mg Tabs (Ibuprofen) .Marland Kitchen... Take 1 tablet by mouth three times a day  Orders: Ketorolac-Toradol 15mg  [id number]) Admin of Therapeutic Inj  intramuscular or subcutaneous [id number])  Problem # 3:  ANXIETY (ICD-300.00) Assessment: Unchanged  Her updated medication list for this problem includes:    Alprazolam 0.5 Mg Tabs (Alprazolam) .Marland Kitchen... 1/2 tab in am, 1/2 tab at noon, 2 tabs at bedtime    Zoloft 50 Mg Tabs (Sertraline hcl) .Marland Kitchen... Take 1 tablet by mouth three times a day in the am  Problem # 4:  OBESITY (ICD-278.00) Assessment: Deteriorated  Ht: 67.5 (08/24/2009)   Wt: 310.25 (08/24/2009)   BMI: 48.05 (08/24/2009)  Problem # 5:  IDDM (ICD-250.01) Assessment: Deteriorated  Her updated medication list for this problem includes:    Glyburide 5 Mg Tabs (Glyburide) .Marland Kitchen... Take 1 tablet by mouth two times a day    Aspirin 81 Mg Tbec (Aspirin) .Marland Kitchen... Take 1 tablet by mouth once a day    Lantus 100 Unit/ml Soln (Insulin  glargine) ..... Inject 50 units sub q twice daily    Humalog Mix 75/25 75-25 % Susp (Insulin lispro prot & lispro) ..... Inject 10 units sub q in am and 25 units in pm    Benazepril Hcl 20 Mg Tabs (Benazepril hcl) ..... One tab by mouth qd  Orders: Glucose, (CBG) (82962) Hemoglobin A1C (83036)  Labs Reviewed: Creat: 0.74 (06/17/2009)    Reviewed HgBA1c results: 10.8 (08/24/2009)  9.5 (04/20/2009)  Problem # 6:  HYPERTENSION (ICD-401.9) Assessment: Unchanged  Her updated medication list for this problem includes:    Furosemide 40 Mg Tabs (Furosemide) .Marland Kitchen... Take 1 tablet by mouth every morning    Amlodipine Besylate 10 Mg Tabs (Amlodipine  besylate) ..... One tab by mouth once daily    Benazepril Hcl 20 Mg Tabs (Benazepril hcl) ..... One tab by mouth qd  BP today: 130/72 Prior BP: 120/74 (06/17/2009)  Labs Reviewed: K+: 4.3 (06/17/2009) Creat: : 0.74 (06/17/2009)   Chol: 192 (06/17/2009)   HDL: 71 (06/17/2009)   LDL: 99 (06/17/2009)   TG: 112 (06/17/2009)  Problem # 7:  HYPERLIPIDEMIA (ICD-272.4) Assessment: Comment Only  Her updated medication list for this problem includes:    Lovastatin 40 Mg Tabs (Lovastatin) .Marland Kitchen... Take 2 tabs every night at bedtime  Labs Reviewed: SGOT: 25 (06/17/2009)   SGPT: 25 (06/17/2009)   HDL:71 (06/17/2009), 65 (03/10/2009)  LDL:99 (06/17/2009), 108 (03/10/2009)  Chol:192 (06/17/2009), 196 (03/10/2009)  Trig:112 (06/17/2009), 117 (03/10/2009)  Complete Medication List: 1)  Calcium 500/d 500-200 Mg-unit Tabs (Calcium carbonate-vitamin d) .... Take 1 tablet by mouth three times a day 2)  Gabapentin 300 Mg Caps (Gabapentin) .... Take 1 tablet by mouth three times a day 3)  Furosemide 40 Mg Tabs (Furosemide) .... Take 1 tablet by mouth every morning 4)  Alprazolam 0.5 Mg Tabs (Alprazolam) .... 1/2 tab in am, 1/2 tab at noon, 2 tabs at bedtime 5)  Glyburide 5 Mg Tabs (Glyburide) .... Take 1 tablet by mouth two times a day 6)  Aspirin 81 Mg Tbec  (Aspirin) .... Take 1 tablet by mouth once a day 7)  Lantus 100 Unit/ml Soln (Insulin glargine) .... Inject 50 units sub q twice daily 8)  Amlodipine Besylate 10 Mg Tabs (Amlodipine besylate) .... One tab by mouth once daily 9)  Humalog Mix 75/25 75-25 % Susp (Insulin lispro prot & lispro) .... Inject 10 units sub q in am and 25 units in pm 10)  Lovastatin 40 Mg Tabs (Lovastatin) .... Take 2 tabs every night at bedtime 11)  Flexeril 10 Mg Tabs (Cyclobenzaprine hcl) .... Take 1 tab by mouth at bedtime 12)  Bd Uf Insulin Syringe 1 Ml 30g  .... Uad 13)  Centrum Silver Tabs (Multiple vitamins-minerals) .... Take 1 tablet by mouth once a day 14)  Potassium 99 Mg Tabs (Potassium) .... Take 1 tablet by mouth once a day 15)  Bd Insulin Syringe Ultrafine 31g X 5/16" 0.3 Ml Misc (Insulin syringe-needle u-100) .... Uad 16)  Benazepril Hcl 20 Mg Tabs (Benazepril hcl) .... One tab by mouth qd 17)  Bd Insulin Syringe Ultrafine 31g X 5/16" 1 Ml Misc (Insulin syringe-needle u-100) .... Uad 18)  Zoloft 50 Mg Tabs (Sertraline hcl) .... Take 1 tablet by mouth three times a day in the am 19)  B Complex-b12 Tabs (B complex vitamins) .... Take 1 tablet by mouth once a day 20)  Ibuprofen 800 Mg Tabs (Ibuprofen) .... Take 1 tablet by mouth three times a day  Patient Instructions: 1)  Please schedule a follow-up appointment in 2 months. 2)  It is important that you exercise regularly at least 20 minutes 5 times a week. If you develop chest pain, have severe difficulty breathing, or feel very tired , stop exercising immediately and seek medical attention. 3)  You need to lose weight. Consider a lower calorie diet and regular exercise.  4)  Pls call back next week with the name of theortho doc you need for your right shoulder and left 5th finger. 5)  You will get an xray of your finger. 6)  Your blood sugars are way outof control again, PLS cut back on sweets, ice-cream, cakes and cookies. Prescriptions: IBUPROFEN 800  MG  TABS (IBUPROFEN) Take 1 tablet by mouth three times a day  #30 x 0   Entered and Authorized by:   Tula Nakayama MD   Signed by:   Tula Nakayama MD on 08/24/2009   Method used:   Electronically to        [facility] (retail)       [address]       [place], [place]  [address]       Ph: [phone]       Fax: [phone]   RxID:   [id number]   Laboratory Results   Blood Tests   Date/Time Received: August 24, 2009  Date/Time Reported: August 24, 2009   Glucose (random): 165 mg/dL   (Normal Range: 70-105) HGBA1C: 10.8%   (Normal Range: Non-Diabetic - 3-6%   Control Diabetic - 6-8%)      Medication Administration  Injection # 1:    Medication: Ketorolac-Toradol 15mg     Diagnosis: SHOULDER PAIN, RIGHT (ICD-719.41)    Route: IM    Site: RUOQ gluteus    Exp Date: 04/25/2011    Lot #: [id number]    Mfr: novaplus    Comments: toradol 60mg  given    Patient tolerated injection without complications    Given by: Jimmey Ralph LPN (December  1, 624THL 11:20 AM)  Orders Added: 1)  Est. Patient Level IV [id number] 2)  Radiology other [Radiology Other] 3)  Glucose, (CBG) [82962] 4)  Hemoglobin A1C [83036] 5)  Ketorolac-Toradol 15mg  [[id number]] 6)  Admin of Therapeutic Inj  intramuscular or subcutaneous [id number]

## 2010-10-24 NOTE — Miscellaneous (Signed)
Summary: Med/Alg Import  Clinical Lists Changes  Medications: Added new medication of HUMALOG MIX 75/25 75-25 %  SUSP (INSULIN LISPRO PROT & LISPRO) Inject 10 units sub q in am and 25 units in pm

## 2010-10-24 NOTE — Progress Notes (Signed)
Summary: results  Phone Note Call from Patient   Summary of Call: would like to get results of x-ray [phone] Initial call taken by: Lenn Cal,  September 27, 2009 3:10 PM  Follow-up for Phone Call        already forwarded Follow-up by: Tula Nakayama MD,  September 27, 2009 5:54 PM

## 2010-10-24 NOTE — Assessment & Plan Note (Signed)
Summary: office visit   Vital Signs:  Patient profile:   70 year old female Menstrual status:  hysterectomy Height:      67.5 inches Weight:      306 pounds BMI:     47.39 O2 Sat:      96 % Pulse rate:   86 / minute Pulse rhythm:   regular Resp:     16 per minute BP sitting:   122 / 74 Cuff size:   xl  Vitals Entered By: Kate Sable (October 26, 2009 10:53 AM)  Nutrition Counseling: Patient's BMI is greater than 25 and therefore counseled on weight management options. CC: Follow up chronic problems Is Patient Diabetic? Yes   Primary Care Provider:  Tula Nakayama MD  CC:  Follow up chronic problems.  History of Present Illness: Pt in primarily for review and med adjustment for uncontrolled blood sugars.Unfortunately, based on the data she presents, little has changed since her last visit.. She is requesting help for her problem of urinary incontinence, which is a real nuisance. She has seen a localised urologist for this , states he told her he could help her no further, she was referred to [facility], feels she got sick when she went there, and will  not go back. Reports  that she is otherwise doing well. Denies recent fever or chills. Denies sinus pressure, nasal congestion , ear pain or sore throat. Denies chest congestion, or cough productive of sputum. Denies chest pain, palpitations, PND, orthopnea or leg swelling. Denies abdominal pain, nausea, vomitting, diarrhea or constipation. Denies change in bowel movements or bloody stool. Denies dysuria , frequency, i or hesitancy.  Denies headaches, vertigo, seizures.  Denies  rash, lesions, or itch.     Current Medications (verified): 1)  Calcium 500/d 500-200 Mg-Unit  Tabs (Calcium Carbonate-Vitamin D) .... Take 1 Tablet By Mouth Three Times A Day 2)  Gabapentin 300 Mg  Caps (Gabapentin) .... Take 1 Tablet By Mouth Three Times A Day 3)  Furosemide 40 Mg  Tabs (Furosemide) .... Take 1 Tablet By Mouth Every  Morning 4)  Alprazolam 0.5 Mg  Tabs (Alprazolam) .... 1/2 Tab in Am, 1/2 Tab At [employer], 2 Tabs At Bedtime 5)  Glyburide 5 Mg  Tabs (Glyburide) .... Take 1 Tablet By Mouth Two Times A Day 6)  Aspirin 81 Mg  Tbec (Aspirin) .... Take 1 Tablet By Mouth Once A Day 7)  Lantus 100 Unit/ml  Soln (Insulin Glargine) .... Inject 50 Units Sub Q Twice Daily 8)  Amlodipine Besylate 10 Mg  Tabs (Amlodipine Besylate) .... One Tab By Mouth Once Daily 9)  Humalog Mix 75/25 75-25 %  Susp (Insulin Lispro Prot & Lispro) .... Inject 10 Units Sub Q in Am and 25 Units in Pm 10)  Lovastatin 40 Mg Tabs (Lovastatin) .... Take 2 Tabs Every Night At Bedtime 11)  Flexeril 10 Mg Tabs (Cyclobenzaprine Hcl) .... Take 1 Tab By Mouth At Bedtime 12)  Bd Uf Insulin Syringe 1 Ml 30g .... Uad 13)  Centrum Silver  Tabs (Multiple Vitamins-Minerals) .... Take 1 Tablet By Mouth Once A Day 14)  Potassium 99 Mg Tabs (Potassium) .... Take 1 Tablet By Mouth Once A Day 15)  Bd Insulin Syringe Ultrafine 31g X 5/16" 0.3 Ml Misc (Insulin Syringe-Needle U-100) .... Uad 16)  Benazepril Hcl 20 Mg Tabs (Benazepril Hcl) .... One Tab By Mouth Qd 17)  Bd Insulin Syringe Ultrafine 31g X 5/16" 1 Ml Misc (Insulin Syringe-Needle U-100) .... Uad  18)  Zoloft 50 Mg Tabs (Sertraline Hcl) .... Take 1 Tablet By Mouth Three Times A Day in The Am 19)  B Complex-B12  Tabs (B Complex Vitamins) .... Take 1 Tablet By Mouth Once A Day 20)  Ibuprofen 800 Mg Tabs (Ibuprofen) .... Take 1 Tablet By Mouth Three Times A Day  Allergies (verified): 1)  ! Darvocet 2)  ! Penicillin 3)  ! Prednisone  Review of Systems      See HPI Eyes:  Denies discharge, eye pain, and red eye. GU:  Complains of incontinence; denies dysuria and urinary frequency; uncontrolled and unchanged incontinence, requesting a 3rd opinion. MS:  Complains of joint pain, low back pain, mid back pain, and stiffness. Psych:  Complains of anxiety, depression, and mental problems; denies suicidal  thoughts/plans, thoughts of violence, and unusual visions or sounds. Endo:  Complains of excessive thirst and excessive urination; pt reports testing on avg twic daily and has her log which shows blood sugar most often over 250, she is still taking her med inconsitently and. Heme:  Denies abnormal bruising and bleeding. Allergy:  Complains of seasonal allergies; denies hives or rash and sneezing.  Physical Exam  General:  alert, well-hydrated, and overweight-appearing.  HEENT: No facial asymmetry,  EOMI, No sinus tenderness, TM's Clear, oropharynx  pink and moist.   Chest: Clear to auscultation bilaterally.  CVS: S1, S2, No murmurs, No S3.   Abd: Soft, Nontender.  MS: decreased  ROM spine, hips, shoulders and knees.  Ext: No edema.   CNS: CN 2-12 intact, power tone and sensation normal throughout.   Skin: Intact, no visible lesions or rashes.  Psych: Good eye contact, normal affect.  Memory loss, not anxious or depressed appearing.     Impression & Recommendations:  Problem # 1:  URINARY INCONTINENCE (ICD-788.30) Assessment Deteriorated  Orders: Urology Referral (Urology), pt requesting 3rd opinion and help for this bothersome problem which persits  Problem # 2:  OBESITY (ICD-278.00) Assessment: Improved  Ht: 67.5 (10/26/2009)   Wt: 306 (10/26/2009)   BMI: 47.39 (10/26/2009)  Problem # 3:  HYPERTENSION (ICD-401.9) Assessment: Unchanged  Her updated medication list for this problem includes:    Furosemide 40 Mg Tabs (Furosemide) .Marland Kitchen... Take 1 tablet by mouth every morning    Amlodipine Besylate 10 Mg Tabs (Amlodipine besylate) ..... One tab by mouth once daily    Benazepril Hcl 20 Mg Tabs (Benazepril hcl) ..... One tab by mouth qd  Orders: T-Basic Metabolic Panel (99991111)  BP today: 122/74 Prior BP: 130/72 (08/24/2009)  Labs Reviewed: K+: 4.3 (06/17/2009) Creat: : 0.74 (06/17/2009)   Chol: 192 (06/17/2009)   HDL: 71 (06/17/2009)   LDL: 99 (06/17/2009)   TG: 112  (06/17/2009)  Problem # 4:  IDDM (ICD-250.01) Assessment: Unchanged  Her updated medication list for this problem includes:    Glyburide 5 Mg Tabs (Glyburide) .Marland Kitchen... Take 1 tablet by mouth two times a day    Aspirin 81 Mg Tbec (Aspirin) .Marland Kitchen... Take 1 tablet by mouth once a day    Lantus 100 Unit/ml Soln (Insulin glargine) ..... Inject 50 units sub q twice daily    Humalog Mix 75/25 75-25 % Susp (Insulin lispro prot & lispro) ..... Inject 10 units sub q in am and 25 units in pm    Benazepril Hcl 20 Mg Tabs (Benazepril hcl) ..... One tab by mouth qd  Orders: Glucose, (CBG) [phone])  Labs Reviewed: Creat: 0.74 (06/17/2009)    Reviewed HgBA1c results: 10.8 (08/24/2009)  9.5 (04/20/2009)  Problem # 5:  DEPRESSION (ICD-311) Assessment: Improved  Her updated medication list for this problem includes:    Alprazolam 0.5 Mg Tabs (Alprazolam) .Marland Kitchen... 1/2 tab in am, 1/2 tab at noon, 2 tabs at bedtime    Zoloft 50 Mg Tabs (Sertraline hcl) .Marland Kitchen... Take 1 tablet by mouth three times a day in the am  Problem # 6:  HYPERLIPIDEMIA (ICD-272.4) Assessment: Comment Only  Her updated medication list for this problem includes:    Lovastatin 40 Mg Tabs (Lovastatin) .Marland Kitchen... Take 2 tabs every night at bedtime  Orders: T-Hepatic Function [phone]) T-Lipid Profile [phone])  Labs Reviewed: SGOT: 25 (06/17/2009)   SGPT: 25 (06/17/2009)   HDL:71 (06/17/2009), 65 (03/10/2009)  LDL:99 (06/17/2009), 108 (03/10/2009)  Chol:192 (06/17/2009), 196 (03/10/2009)  Trig:112 (06/17/2009), 117 (03/10/2009)  Complete Medication List: 1)  Calcium 500/d 500-200 Mg-unit Tabs (Calcium carbonate-vitamin d) .... Take 1 tablet by mouth three times a day 2)  Gabapentin 300 Mg Caps (Gabapentin) .... Take 1 tablet by mouth three times a day 3)  Furosemide 40 Mg Tabs (Furosemide) .... Take 1 tablet by mouth every morning 4)  Alprazolam 0.5 Mg Tabs (Alprazolam) .... 1/2 tab in am, 1/2 tab at noon, 2 tabs at bedtime 5)   Glyburide 5 Mg Tabs (Glyburide) .... Take 1 tablet by mouth two times a day 6)  Aspirin 81 Mg Tbec (Aspirin) .... Take 1 tablet by mouth once a day 7)  Lantus 100 Unit/ml Soln (Insulin glargine) .... Inject 50 units sub q twice daily 8)  Amlodipine Besylate 10 Mg Tabs (Amlodipine besylate) .... One tab by mouth once daily 9)  Humalog Mix 75/25 75-25 % Susp (Insulin lispro prot & lispro) .... Inject 10 units sub q in am and 25 units in pm 10)  Lovastatin 40 Mg Tabs (Lovastatin) .... Take 2 tabs every night at bedtime 11)  Flexeril 10 Mg Tabs (Cyclobenzaprine hcl) .... Take 1 tab by mouth at bedtime 12)  Bd Uf Insulin Syringe 1 Ml 30g  .... Uad 13)  Centrum Silver Tabs (Multiple vitamins-minerals) .... Take 1 tablet by mouth once a day 14)  Potassium 99 Mg Tabs (Potassium) .... Take 1 tablet by mouth once a day 15)  Bd Insulin Syringe Ultrafine 31g X 5/16" 0.3 Ml Misc (Insulin syringe-needle u-100) .... Uad 16)  Benazepril Hcl 20 Mg Tabs (Benazepril hcl) .... One tab by mouth qd 17)  Bd Insulin Syringe Ultrafine 31g X 5/16" 1 Ml Misc (Insulin syringe-needle u-100) .... Uad 18)  Zoloft 50 Mg Tabs (Sertraline hcl) .... Take 1 tablet by mouth three times a day in the am 19)  B Complex-b12 Tabs (B complex vitamins) .... Take 1 tablet by mouth once a day 20)  Ibuprofen 800 Mg Tabs (Ibuprofen) .... Take 1 tablet by mouth three times a day  Patient Instructions: 1)  F/U in 6 to 8 weeks. 2)  PLEASE take  humalog 75/25   10 units in the morning and continue 25 units in the evening. 3)  BMP prior to visit, ICD-9: 4)  Hepatic Panel prior to visit, ICD-9:  fasting  today 5)  Lipid Panel prior to visit, ICD-9: 6)  Your fasting sugars need to be less than 150 preferabbly, goal range is 90 to 130 Prescriptions: AMLODIPINE BESYLATE 10 MG  TABS (AMLODIPINE BESYLATE) one tab by mouth once daily  #30 Each x 4   Entered by:   Kate Sable   Authorized by:   Tula Nakayama MD   Signed by:  Brandi Hudy on  10/26/2009   Method used:   Electronically to        [facility] (retail)       [address] [place], [place]  [address]       Ph: [phone]       Fax: [phone]   RxID:   [id number] GLYBURIDE 5 MG  TABS (GLYBURIDE) Take 1 tablet by mouth two times a day  #60 Each x 4   Entered by:   Kate Sable   Authorized by:   Tula Nakayama MD   Signed by:   Kate Sable on 10/26/2009   Method used:   Electronically to        [facility] (retail)       [address], [place]  [address]       Ph: [phone]       Fax: [phone]   RxID:   [id number] GABAPENTIN 300 MG  CAPS (GABAPENTIN) Take 1 tablet by mouth three times a day  #90 Each x 4   Entered by:   Kate Sable   Authorized by:   Tula Nakayama MD   Signed by:   Kate Sable on 10/26/2009   Method used:   Electronically to        [facility] (retail)       [address]       [place], [place]  [address]       Ph: [phone]       Fax: [phone]   RxID:   [id number]   Laboratory Results   Blood Tests     Glucose (fasting): 264 mg/dL   (Normal Range: 70-105)

## 2010-10-24 NOTE — Letter (Signed)
Summary: Letter TO DR. PRESTON CLARK  Letter TO DR. PRESTON CLARK   Imported By: Dierdre Harness 05/16/2010 14:28:09  _____________________________________________________________________  External Attachment:    Type:   Image     Comment:   External Document

## 2010-10-24 NOTE — Assessment & Plan Note (Signed)
Summary: OV   Vital Signs:  Patient profile:   70 year old female Menstrual status:  hysterectomy Height:      67.5 inches (171.45 cm) Weight:      307.13 pounds (139.60 kg) BMI:     47.56 O2 Sat:      96 % Temp:     97.5 degrees F (36.39 degrees C) Pulse rate:   80 / minute Pulse rhythm:   regular Resp:     16 per minute BP sitting:   140 / 60  (left arm)  Vitals Entered By: Jimmey Ralph LPN (May 12, 624THL 624THL AM)  Nutrition Counseling: Patient's BMI is greater than 25 and therefore counseled on weight management options. CC: cough Is Patient Diabetic? Yes   CC:  cough.  History of Present Illness: I have had the crud for the past 6 months.. Crackling and increased sputum brown for the past 4 days, chills, no documented  fever. She reports fatigue from poor sleep due to excess cough. Also reports daytime sleepiness with snoring, wants to be re tested for OSA. She has tried to improve her diet and has lost 4 pounds and she also states that her sugars are slightly improved. She not only reports cough, but also head congestion with green drainage.   Current Medications (verified): 1)  Calcium 500/d 500-200 Mg-Unit  Tabs (Calcium Carbonate-Vitamin D) .... Take 1 Tablet By Mouth Three Times A Day 2)  Gabapentin 300 Mg  Caps (Gabapentin) .... Take 1 Tablet By Mouth Three Times A Day 3)  Furosemide 40 Mg  Tabs (Furosemide) .... Take 1 Tablet By Mouth Every Morning  and 1/2 Tab At 2pm 4)  Alprazolam 0.5 Mg  Tabs (Alprazolam) .... 1/2 Tab in Am, 1/2 Tab At [employer], 2 Tabs At Bedtime 5)  Sertraline Hcl 50 Mg  Tabs (Sertraline Hcl) .... Take 3  Tablets  By Mouth Every Morning 6)  Glyburide 5 Mg  Tabs (Glyburide) .... Take 1 Tablet By Mouth Two Times A Day 7)  Aspirin 81 Mg  Tbec (Aspirin) .... Take 1 Tablet By Mouth Once A Day 8)  Lantus 100 Unit/ml  Soln (Insulin Glargine) .... Inject 50 Units Sub Q Twice Daily 9)  Amlodipine Besylate 10 Mg  Tabs (Amlodipine Besylate) .... One Tab By  Mouth Once Daily 10)  Benazepril Hcl 20 Mg  Tabs (Benazepril Hcl) .... One Tab By Mouth Once Daily 11)  Ginkoba 40 Mg  Tabs (Ginkgo Biloba) .... One Tab By Mouth Once Daily 12)  Humalog Mix 75/25 75-25 %  Susp (Insulin Lispro Prot & Lispro) .... Inject 10 Units Sub Q in Am and 25 Units in Pm 13)  Lovastatin 40 Mg Tabs (Lovastatin) .... Take 2 Tabs Every Night At Bedtime 14)  Flexeril 10 Mg Tabs (Cyclobenzaprine Hcl) .... Take 1 Tab By Mouth At Bedtime 15)  Bd Uf Insulin Syringe 1 Ml 30g .... Uad 16)  Centrum Silver  Tabs (Multiple Vitamins-Minerals) .... Take 1 Tablet By Mouth Once A Day 17)  Ibu 800 Mg Tabs (Ibuprofen) .... Take 1 Tablet By Mouth Three Times A Day  Allergies (verified): 1)  ! Darvocet 2)  ! Penicillin 3)  ! Prednisone  Review of Systems General:  See HPI; Complains of chills, fatigue, fever, and sleep disorder; xs snoring with daytime sleepiness and chronic fatigue. ENT:  Complains of hoarseness, nasal congestion, postnasal drainage, sinus pressure, and sore throat. CV:  Denies chest pain or discomfort, palpitations, and swelling of feet. Resp:  Complains of cough, excessive snoring, hypersomnolence, shortness of breath, sputum productive, and wheezing. GI:  Denies abdominal pain, constipation, diarrhea, nausea, and vomiting. GU:  Complains of incontinence; denies dysuria and urinary frequency. MS:  Complains of joint pain, low back pain, mid back pain, and stiffness. Psych:  Complains of anxiety and depression; denies suicidal thoughts/plans, thoughts of violence, and unusual visions or sounds. Endo:  Denies cold intolerance, excessive hunger, excessive thirst, excessive urination, heat intolerance, polyuria, and weight change.  Physical Exam  General:  alert, well-hydrated, and overweight-appearing.  HEENT: No facial asymmetry,  EOMI,frontal and maxillary sinus tenderness, TM's Clear, oropharynx  pink and moist.   Chest: decreased air entry, bilateral  crackles and  wheezes CVS: S1, S2, No murmurs, No S3.   Abd: Soft, Nontender.  MS: decreased ROM spine, hips, shoulders and knees.  Ext: No edema.   CNS: CN 2-12 intact, power tone and sensation normal throughout.   Skin: Intact, no visible lesions or rashes.  Psych: Good eye contact, normal affect.  Memory intact, not anxious or depressed appearing.     Impression & Recommendations:  Problem # 1:  ACUTE SINUSITIS, UNSPECIFIED (ICD-461.9) Assessment Comment Only  Her updated medication list for this problem includes:    Doxycycline Hyclate 100 Mg Tabs (Doxycycline hyclate) .Marland Kitchen... Take 1 tablet by mouth two times a day    Tessalon Perles 100 Mg Caps (Benzonatate) .Marland Kitchen... Take 1 capsule by mouth three times a day as needed  Orders: Radiology Referral (Radiology)  Problem # 2:  ACUTE BRONCHITIS (ICD-466.0) Assessment: Comment Only  Her updated medication list for this problem includes:    Doxycycline Hyclate 100 Mg Tabs (Doxycycline hyclate) .Marland Kitchen... Take 1 tablet by mouth two times a day    Tessalon Perles 100 Mg Caps (Benzonatate) .Marland Kitchen... Take 1 capsule by mouth three times a day as needed  Orders: CXR- 2view (CXR) Nebulizer Tx [id number]) Albuterol Sulfate Sol 1mg  unit dose [id number]) Atrovent 1mg  (Neb) [phone]) T-Culture, Sputum & Gram Stain ([id number])  Problem # 3:  HYPERSOMNIA, ASSOCIATED WITH SLEEP APNEA (ICD-780.53) Assessment: Comment Only  Orders: Sleep Disorder Referral (Sleep Disorder)  Problem # 4:  BACK PAIN, CHRONIC (ICD-724.5) Assessment: Unchanged  Her updated medication list for this problem includes:    Aspirin 81 Mg Tbec (Aspirin) .Marland Kitchen... Take 1 tablet by mouth once a day    Flexeril 10 Mg Tabs (Cyclobenzaprine hcl) .Marland Kitchen... Take 1 tab by mouth at bedtime    Ibu 800 Mg Tabs (Ibuprofen) .Marland Kitchen... Take 1 tablet by mouth three times a day  Problem # 5:  OBESITY (ICD-278.00) Assessment: Improved  Ht: 67.5 (02/02/2009)   Wt: 307.13 (02/02/2009)   BMI: 47.56 (02/02/2009)  Problem  # 6:  HYPERTENSION (ICD-401.9) Assessment: Deteriorated  Her updated medication list for this problem includes:    Furosemide 40 Mg Tabs (Furosemide) .Marland Kitchen... Take 1 tablet by mouth every morning  and 1/2 tab at 2pm    Amlodipine Besylate 10 Mg Tabs (Amlodipine besylate) ..... One tab by mouth once daily    Benazepril Hcl 20 Mg Tabs (Benazepril hcl) ..... One tab by mouth once daily  BP today: 140/60 Prior BP: 120/70 (01/19/2009)  Labs Reviewed: K+: 4.4 (10/21/2008) Creat: : 0.76 (10/21/2008)   Chol: 185 (10/21/2008)   HDL: 58 (10/21/2008)   LDL: 109 (10/21/2008)   TG: 88 (10/21/2008)  Problem # 7:  IDDM (ICD-250.01) Assessment: Comment Only  Her updated medication list for this problem includes:    Glyburide 5 Mg Tabs (Glyburide) .Marland KitchenMarland KitchenMarland KitchenMarland Kitchen  Take 1 tablet by mouth two times a day    Aspirin 81 Mg Tbec (Aspirin) .Marland Kitchen... Take 1 tablet by mouth once a day    Lantus 100 Unit/ml Soln (Insulin glargine) ..... Inject 50 units sub q twice daily    Benazepril Hcl 20 Mg Tabs (Benazepril hcl) ..... One tab by mouth once daily    Humalog Mix 75/25 75-25 % Susp (Insulin lispro prot & lispro) ..... Inject 10 units sub q in am and 25 units in pm  Orders: Glucose, (CBG) [id number])  Labs Reviewed: Creat: 0.76 (10/21/2008)    Reviewed HgBA1c results: 9.5 (01/19/2009)  10.0 (10/21/2008)  Complete Medication List: 1)  Calcium 500/d 500-200 Mg-unit Tabs (Calcium carbonate-vitamin d) .... Take 1 tablet by mouth three times a day 2)  Gabapentin 300 Mg Caps (Gabapentin) .... Take 1 tablet by mouth three times a day 3)  Furosemide 40 Mg Tabs (Furosemide) .... Take 1 tablet by mouth every morning  and 1/2 tab at 2pm 4)  Alprazolam 0.5 Mg Tabs (Alprazolam) .... 1/2 tab in am, 1/2 tab at noon, 2 tabs at bedtime 5)  Sertraline Hcl 50 Mg Tabs (Sertraline hcl) .... Take 3  tablets  by mouth every morning 6)  Glyburide 5 Mg Tabs (Glyburide) .... Take 1 tablet by mouth two times a day 7)  Aspirin 81 Mg Tbec (Aspirin) ....  Take 1 tablet by mouth once a day 8)  Lantus 100 Unit/ml Soln (Insulin glargine) .... Inject 50 units sub q twice daily 9)  Amlodipine Besylate 10 Mg Tabs (Amlodipine besylate) .... One tab by mouth once daily 10)  Benazepril Hcl 20 Mg Tabs (Benazepril hcl) .... One tab by mouth once daily 11)  Ginkoba 40 Mg Tabs (Ginkgo biloba) .... One tab by mouth once daily 12)  Humalog Mix 75/25 75-25 % Susp (Insulin lispro prot & lispro) .... Inject 10 units sub q in am and 25 units in pm 13)  Lovastatin 40 Mg Tabs (Lovastatin) .... Take 2 tabs every night at bedtime 14)  Flexeril 10 Mg Tabs (Cyclobenzaprine hcl) .... Take 1 tab by mouth at bedtime 15)  Bd Uf Insulin Syringe 1 Ml 30g  .... Uad 16)  Centrum Silver Tabs (Multiple vitamins-minerals) .... Take 1 tablet by mouth once a day 17)  Ibu 800 Mg Tabs (Ibuprofen) .... Take 1 tablet by mouth three times a day 18)  Doxycycline Hyclate 100 Mg Tabs (Doxycycline hyclate) .... Take 1 tablet by mouth two times a day 19)  Tessalon Perles 100 Mg Caps (Benzonatate) .... Take 1 capsule by mouth three times a day as needed  Patient Instructions: 1)  Please schedule a follow-up appointment in 1 month. 2)  You are being treated for chronic sinusitis  and  bronchitis .  3)  You are being referred for Ct of your sinuses and a sleep study.  4)  Please get a cxr today. 5)  Meds are sent in for 3 weeks of antibiotics. Prescriptions: TESSALON PERLES 100 MG CAPS (BENZONATATE) Take 1 capsule by mouth three times a day as needed  #42 x 0   Entered and Authorized by:   Tula Nakayama MD   Signed by:   Tula Nakayama MD on 02/02/2009   Method used:   Electronically to        [facility]* (retail)       [address], [place]  [id number]       Ph: [phone]       Fax: [phone]   [id number] DOXYCYCLINE HYCLATE 100 MG TABS (DOXYCYCLINE HYCLATE) Take 1 tablet by mouth two times a day  #42 x 0   Entered  and Authorized by:   Tula Nakayama MD   Signed by:   Tula Nakayama MD on 02/02/2009   Method used:   Electronically to        [facility]* (retail)       [address]       [place], [place]  [address]       Ph: [phone]       Fax: [phone]   RxID:   [id number]      Laboratory Results   Blood Tests   Date/Time Received: 02/02/09 11am Date/Time Reported: 02/02/09 11am  Glucose (random): 254 mg/dL   (Normal Range: 70-105)      Medication Administration  Medication # 1:    Medication: Albuterol Sulfate Sol 1mg  unit dose    Diagnosis: ACUTE BRONCHITIS (ICD-466.0)    Dose: 2.5mg     Route: inhaled    Exp Date: 12/11    Lot #: [id number]    Mfr: nephron pharmaceutical    Given by: Jimmey Ralph LPN (May 12, 624THL QA348G AM)  Medication # 2:    Medication: Atrovent 1mg  (Neb)    Diagnosis: ACUTE BRONCHITIS (ICD-466.0)    Dose: 0.5mg     Route: inhaled    Exp Date: 8/11    Lot #: [id number]    Mfr: nephron pharmaceuticals    Given by: Jimmey Ralph LPN (May 12, 624THL QA348G AM)  Orders Added: 1)  Glucose, (CBG) [82962] 2)  Est. Patient Level IV [id number] 3)  CXR- 2view [CXR] 4)  Radiology Referral [Radiology] 5)  Nebulizer Tx [id number] 6)  Albuterol Sulfate Sol 1mg  unit dose [J7613] 7)  Atrovent 1mg  (Neb) [id number] 8)  T-Culture, Sputum & Gram Stain [[id number]] 9)  Sleep Disorder Referral [Sleep Disorder]

## 2010-10-24 NOTE — Letter (Signed)
Summary: [facility]  [facility]   Imported By: Dierdre Harness 06/12/2010 10:19:25  _____________________________________________________________________  External Attachment:    Type:   Image     Comment:   External Document

## 2010-10-24 NOTE — Assessment & Plan Note (Signed)
Summary: f up   Vital Signs:  Patient profile:   70 year old female Menstrual status:  hysterectomy Height:      67.5 inches Weight:      312.75 pounds BMI:     48.44 O2 Sat:      98 % Pulse rate:   73 / minute Pulse rhythm:   regular Resp:     16 per minute BP sitting:   134 / 72  (left arm) Cuff size:   xl  Vitals Entered By: Kate Sable LPN (June 21, 624THL QA348G AM)  Nutrition Counseling: Patient's BMI is greater than 25 and therefore counseled on weight management options. CC: Has been itchy under her skin all over and she doesn't know why   Primary Care Provider:  Tula Nakayama MD  CC:  Has been itchy under her skin all over and she doesn't know why.  History of Present Illness: Pt has been to endo twice may 23 and June 8. She is on a sliding scale  of 75/25 based on fasting sugars, and new  is the 50/50 40 units at night. She is still not testing twice daily , but I advised of the impt of this.  She has started walking and seems motivated to working [employer] on her blood sugar control. She c/o abnormal crawling sensations under her skin, no rash, no fever, no other family member affected.  Current Medications (verified): 1)  Gabapentin 300 Mg  Caps (Gabapentin) .... Take 1 Tablet By Mouth Three Times A Day 2)  Furosemide 40 Mg  Tabs (Furosemide) .... Take 1 Tablet By Mouth Every Morning 3)  Alprazolam 0.5 Mg  Tabs (Alprazolam) .... 1/2 Tab in Am, 1/2 Tab At [employer], 2 Tabs At Bedtime 4)  Aspirin 81 Mg  Tbec (Aspirin) .... Take 1 Tablet By Mouth Once A Day 5)  Amlodipine Besylate 10 Mg  Tabs (Amlodipine Besylate) .... One Tab By Mouth Once Daily 6)  Humalog Mix 75/25 75-25 %  Susp (Insulin Lispro Prot & Lispro) .... Inject 10 Units Sub Q in Am and 25 Units in Pm 7)  Lovastatin 40 Mg Tabs (Lovastatin) .... Take 2 Tabs Every Night At Bedtime 8)  Flexeril 10 Mg Tabs (Cyclobenzaprine Hcl) .... Take 1 Tab By Mouth At Bedtime 9)  Centrum Silver  Tabs (Multiple  Vitamins-Minerals) .... Take 1 Tablet By Mouth Once A Day 10)  Bd Insulin Syringe Ultrafine 31g X 5/16" 0.3 Ml Misc (Insulin Syringe-Needle U-100) .... Uad 11)  Benazepril Hcl 20 Mg Tabs (Benazepril Hcl) .... One Tab By Mouth Qd 12)  Bd Insulin Syringe Ultrafine 30g X 1/2" 1 Ml Misc (Insulin Syringe-Needle U-100) .... Uad 13)  Zoloft 50 Mg Tabs (Sertraline Hcl) .... Take 1 Tablet By Mouth Three Times A Day in The Am 14)  B Complex-B12  Tabs (B Complex Vitamins) .... Take 1 Tablet By Mouth Once A Day 15)  Diclofenac Sodium 75 Mg Tbec (Diclofenac Sodium) .... Take 1 Two Times A Day As Needed For Knee Pain 16)  Lifescan Unistik Ii Lancets  Misc (Lancets) .... Two Times A Day Testing 17)  Onetouch Ultra Test  Strp (Glucose Blood) .... For Two Times A Day Testing 18)  Humalog Mix 50/50 50-50 % Susp (Insulin Lispro Prot & Lispro) .... Take 40 Units After Supper Everynight  Allergies (verified): 1)  ! Darvocet 2)  ! Penicillin 3)  ! Prednisone  Review of Systems      See HPI General:  Complains  of fatigue. Eyes:  Denies discharge, eye pain, and red eye. ENT:  Denies earache, hoarseness, nasal congestion, sinus pressure, and sore throat. CV:  Complains of swelling of feet; denies chest pain or discomfort, difficulty breathing while lying down, palpitations, and shortness of breath with exertion. Resp:  Denies cough and sputum productive. GI:  Denies abdominal pain, constipation, diarrhea, nausea, and vomiting. GU:  Complains of incontinence; denies dysuria and urinary frequency; surgical intervention is planned. MS:  Complains of joint pain, low back pain, mid back pain, and stiffness. Derm:  Complains of itching and lesion(s); itching over body std yesterday, std on the shoulders, no new food or skin products, c/o rash on left foot since yesterday. Neuro:  Complains of poor balance; denies sensation of room spinning and tingling. Psych:  Complains of anxiety, easily angered, and mental problems;  denies suicidal thoughts/plans, thoughts of violence, and unusual visions or sounds. Endo:  Complains of excessive thirst and excessive urination; fasting blood sugars seldom under 170. Allergy:  Complains of seasonal allergies.  Physical Exam  General:  Well-developed,obese,in no acute distress; alert,appropriate and cooperative throughout examination HEENT: No facial asymmetry,  EOMI, No sinus tenderness, TM's Clear, oropharynx  pink and moist.   Chest: Clear to auscultation bilaterally.  CVS: S1, S2, No murmurs, No S3.   Abd: Soft, Nontender.  MS: decreased  ROM spine, hips, shoulders and knees.  Ext: No edema.   CNS: CN 2-12 intact, power tone and sensation normal throughout.   Skin: Intact, no visible lesions or rashes.  Psych: Good eye contact, normal affect.  Memory fair, not anxious or depressed appearing.    Impression & Recommendations:  Problem # 1:  PRURITUS (ICD-698.9) Assessment Deteriorated  Orders: Admin of Therapeutic Inj  intramuscular or subcutaneous [id number]) Benadryl  IM or IV (J1200)  Problem # 2:  KNEE PAIN, LEFT (ICD-719.46) Assessment: Unchanged  Her updated medication list for this problem includes:    Aspirin 81 Mg Tbec (Aspirin) .Marland Kitchen... Take 1 tablet by mouth once a day    Flexeril 10 Mg Tabs (Cyclobenzaprine hcl) .Marland Kitchen... Take 1 tab by mouth at bedtime    Diclofenac Sodium 75 Mg Tbec (Diclofenac sodium) .Marland Kitchen... Take 1 two times a day as needed for knee pain  Problem # 3:  ANXIETY (ICD-300.00) Assessment: Improved  Her updated medication list for this problem includes:    Alprazolam 0.5 Mg Tabs (Alprazolam) .Marland Kitchen... 1/2 tab in am, 1/2 tab at noon, 2 tabs at bedtime    Zoloft 50 Mg Tabs (Sertraline hcl) .Marland Kitchen... Take 1 tablet by mouth three times a day in the am    Hydroxyzine Hcl 25 Mg Tabs (Hydroxyzine hcl) .Marland Kitchen... Take 1 tab by mouth at bedtime as needed  Problem # 4:  HYPERTENSION (ICD-401.9) Assessment: Improved  Her updated medication list for this  problem includes:    Furosemide 40 Mg Tabs (Furosemide) .Marland Kitchen... Take 1 tablet by mouth every morning    Amlodipine Besylate 10 Mg Tabs (Amlodipine besylate) ..... One tab by mouth once daily    Benazepril Hcl 20 Mg Tabs (Benazepril hcl) ..... One tab by mouth qd  BP today: 134/72 Prior BP: 140/68 (01/26/2010)  Labs Reviewed: K+: 4.5 (01/26/2010) Creat: : 0.84 (01/26/2010)   Chol: 199 (01/26/2010)   HDL: 71 (01/26/2010)   LDL: 109 (01/26/2010)   TG: 93 (01/26/2010)  Problem # 5:  IDDM (ICD-250.01) Assessment: Unchanged  Labs Reviewed: Creat: 0.84 (01/26/2010)    Reviewed HgBA1c results: 11.0 (01/26/2010)  10.8 (08/24/2009)  Orders: T-Urine Microalbumin w/creat. ratio [phone])  Complete Medication List: 1)  Gabapentin 300 Mg Caps (Gabapentin) .... Take 1 tablet by mouth three times a day 2)  Furosemide 40 Mg Tabs (Furosemide) .... Take 1 tablet by mouth every morning 3)  Alprazolam 0.5 Mg Tabs (Alprazolam) .... 1/2 tab in am, 1/2 tab at noon, 2 tabs at bedtime 4)  Aspirin 81 Mg Tbec (Aspirin) .... Take 1 tablet by mouth once a day 5)  Amlodipine Besylate 10 Mg Tabs (Amlodipine besylate) .... One tab by mouth once daily 6)  Humalog Mix 75/25 75-25 % Susp (Insulin lispro prot & lispro) .... Inject 10 units sub q in am and 25 units in pm 7)  Lovastatin 40 Mg Tabs (Lovastatin) .... Take 2 tabs every night at bedtime 8)  Flexeril 10 Mg Tabs (Cyclobenzaprine hcl) .... Take 1 tab by mouth at bedtime 9)  Centrum Silver Tabs (Multiple vitamins-minerals) .... Take 1 tablet by mouth once a day 10)  Bd Insulin Syringe Ultrafine 31g X 5/16" 0.3 Ml Misc (Insulin syringe-needle u-100) .... Uad 11)  Benazepril Hcl 20 Mg Tabs (Benazepril hcl) .... One tab by mouth qd 12)  Bd Insulin Syringe Ultrafine 30g X 1/2" 1 Ml Misc (Insulin syringe-needle u-100) .... Uad 13)  Zoloft 50 Mg Tabs (Sertraline hcl) .... Take 1 tablet by mouth three times a day in the am 14)  B Complex-b12 Tabs (B complex  vitamins) .... Take 1 tablet by mouth once a day 15)  Diclofenac Sodium 75 Mg Tbec (Diclofenac sodium) .... Take 1 two times a day as needed for knee pain 16)  Lifescan Unistik Ii Lancets Misc (Lancets) .... Two times a day testing 17)  Onetouch Ultra Test Strp (Glucose blood) .... For two times a day testing 18)  Humalog Mix 50/50 50-50 % Susp (Insulin lispro prot & lispro) .... Take 40 units after supper everynight 19)  Hydroxyzine Hcl 25 Mg Tabs (Hydroxyzine hcl) .... Take 1 tab by mouth at bedtime as needed 20)  Clotrimazole-betamethasone 1-0.05 % Crea (Clotrimazole-betamethasone) .... Apply twice daily to rash on leg as needed  Patient Instructions: 1)  Please schedule a follow-up appointment in 2 months. 2)  It is important that you exercise regularly at least 20 minutes 5 times a week. If you develop chest pain, have severe difficulty breathing, or feel very tired , stop exercising immediately and seek medical attention. 3)  You need to lose weight. Consider a lower calorie diet and regular exercise.  4)  Pls stop snacking on candy, cookies, icecream, you need to eat vegetables and fruit, do not drink sodas. 5)  PLS check and write down your sugars TWICE daily as Dr Loletta Specter requested 6)  Medication for itching, tablet and cream have been sent in for this Prescriptions: BENAZEPRIL HCL 20 MG TABS (BENAZEPRIL HCL) one tab by mouth qd  #30 x 1   Entered by:   Baldomero Lamy LPN   Authorized by:   Tula Nakayama MD   Signed by:   Baldomero Lamy LPN on 579FGE   Method used:   Electronically to        [facility] (retail)       [address], [place]  [address]       Ph: [phone]       Fax: [phone]   RxID:   [id number] FLEXERIL 10 MG TABS (CYCLOBENZAPRINE HCL)  Take 1 tab by mouth at bedtime  #30 x 1   Entered by:   Baldomero Lamy LPN   Authorized by:   Tula Nakayama MD   Signed by:   Baldomero Lamy LPN on 579FGE   Method used:    Electronically to        [facility] (retail)       [address], [place]  [address]       Ph: [phone]       Fax: [phone]   RxID:   [id number] CLOTRIMAZOLE-BETAMETHASONE 1-0.05 % CREA (CLOTRIMAZOLE-BETAMETHASONE) apply twice daily to rash on leg as needed  #45 gm x 0   Entered and Authorized by:   Tula Nakayama MD   Signed by:   Tula Nakayama MD on 03/14/2010   Method used:   Electronically to        [facility] (retail)       [address] [place], [place]  [address]       Ph: [phone]       Fax: [phone]   RxID:   [id number] HYDROXYZINE HCL 25 MG TABS (HYDROXYZINE HCL) Take 1 tab by mouth at bedtime as needed  #20 x 0   Entered and Authorized by:   Tula Nakayama MD   Signed by:   Tula Nakayama MD on 03/14/2010   Method used:   Electronically to        [facility] (retail)       [address], [place]  [address]       Ph: [phone]       Fax: [phone]   RxID:   [id number]    Medication Administration  Injection # 1:    Medication: Benadryl  IM or IV    Diagnosis: PRURITUS (ICD-698.9)    Route: IM    Site: R deltoid    Exp Date: 6/12    Lot #: [id number]    Mfr: baxter    Comments: 25mg  given    Patient tolerated injection without complications    Given by: Baldomero Lamy LPN (June 21, 624THL 075-GRM PM)  Orders Added: 1)  Est. Patient Level IV [id number] 2)  T-Urine Microalbumin w/creat. ratio [[id number]] 3)  Admin of Therapeutic Inj  intramuscular or subcutaneous [96372] 4)  Benadryl  IM or IV [id number]

## 2010-10-24 NOTE — Progress Notes (Signed)
Summary: paper  Phone Note Call from Patient   Summary of Call: please give Pam a call about [employer] power wheelchair papers. [phone] Initial call taken by: Lenn Cal,  April 05, 2010 9:36 AM  Follow-up for Phone Call        returned call, no answer Follow-up by: Baldomero Lamy LPN,  July 13, 624THL 624THL AM  Additional Follow-up for Phone Call Additional follow up Details #1::        she needs to be assesed in the physical therapy dept to see if shequalifies for a PWC , let her kniow and pls send a msg to referrals or refer her for same Additional Follow-up by: Tula Nakayama MD,  April 05, 2010 12:09 PM    Additional Follow-up for Phone Call Additional follow up Details #2::    returned call, no answer   called patient, busy signal x2 Follow-up by: Baldomero Lamy LPN,  July 14, 624THL 579FGE PM  Additional Follow-up for Phone Call Additional follow up Details #3:: Details for Additional Follow-up Action Taken: patient states she is not interested at this time Additional Follow-up by: Baldomero Lamy LPN,  July 15, 624THL [id number] AM

## 2010-10-24 NOTE — Letter (Signed)
Summary: [facility]  [facility] REFERRAL   Imported By: Dierdre Harness 01/26/2010 14:06:13  _____________________________________________________________________  External Attachment:    Type:   Image     Comment:   External Document

## 2010-10-24 NOTE — Progress Notes (Signed)
Summary: diabetic shoes  Phone Note Call from Patient   Summary of Call: wants some diabetic shoes  send rx to [facility] Initial call taken by: Dierdre Harness,  August 29, 2010 10:46 AM  Follow-up for Phone Call        rx written and script needs to be faxedlet pt know when done pls Follow-up by: Tula Nakayama MD,  August 31, 2010 4:41 PM  Additional Follow-up for Phone Call Additional follow up Details #1::        rx sent Additional Follow-up by: Baldomero Lamy LPN,  December  9, 624THL 2:30 PM

## 2010-10-24 NOTE — Miscellaneous (Signed)
Summary: refill  Clinical Lists Changes  Medications: Changed medication from BD INSULIN SYRINGE ULTRAFINE 31G X 5/16" 1 ML MISC (INSULIN SYRINGE-NEEDLE U-100) uad to BD INSULIN SYRINGE ULTRAFINE 30G X 1/2" 1 ML MISC (INSULIN SYRINGE-NEEDLE U-100) uad - Signed Removed medication of * BD UF INSULIN SYRINGE 1 ML 30G UAD Rx of BD INSULIN SYRINGE ULTRAFINE 30G X 1/2" 1 ML MISC (INSULIN SYRINGE-NEEDLE U-100) uad;  #100 x 3;  Signed;  Entered by: Kate Sable LPN;  Authorized by: Tula Nakayama MD;  Method used: Telephoned to [facility] [place] [address], [place], [place], [place]  [address], Ph: [phone], Fax: [phone]    Prescriptions: BD INSULIN SYRINGE ULTRAFINE 30G X 1/2" 1 ML MISC (INSULIN SYRINGE-NEEDLE U-100) uad  #100 x 3   Entered by:   Kate Sable LPN   Authorized by:   Tula Nakayama MD   Signed by:   Kate Sable LPN on [id number]   Method used:   Telephoned to ...       [facility] (retail)       [address]       [place], [place]  [address]       Ph: [phone]       Fax: [phone]   RxID:   [id number]

## 2010-10-24 NOTE — Assessment & Plan Note (Signed)
Summary: weak and aches- room 1   Vital Signs:  Patient profile:   70 year old female Menstrual status:  hysterectomy Height:      67.5 inches Weight:      308.50 pounds BMI:     47.78 O2 Sat:      96 % on Room air Pulse rate:   81 / minute Resp:     16 per minute BP sitting:   130 / 60  (left arm)  Vitals Entered By: Baldomero Lamy LPN (April 27, 624THL D34-534 PM)  Nutrition Counseling: Patient's BMI is greater than 25 and therefore counseled on weight management options. CC: hurts all over, extremely fatigued and weak Is Patient Diabetic? Yes Comments patient didnt bring meds   Primary Provider:  Tula Nakayama MD  CC:  hurts all over and extremely fatigued and weak.  History of Present Illness: Pt is here today with c/o Lt knee pain "for awhile."  Hx of arthroscopy on Rt  approx 7 yrs ago.  No trauma.  No swelling.  Pain worsens with wt bearing.   Hx of DM.  Taking medications as prescribed. No episodes of hypoglycemia. Checking blood sugar at home. Hve been running higher lately.  Fasting in the 300's last last few days.  Prev readings pt brought vary 80's to 200.   Granddaughter is with pt today & states she has been drinking sodas,eating cakes, cookies and ice cream every day.  Pt admits that her diet has worsened recently. She has noticed since her blood sugars have been high that she is not feeling well, tired,  & thirsty.   Allergies: 1)  ! Darvocet 2)  ! Penicillin 3)  ! Prednisone  Past History:  Past medical history reviewed for relevance to current acute and chronic problems.  Past Medical History: Reviewed history from 09/05/2007 and no changes required. BACK PAIN, CHRONIC (ICD-724.5) ANXIETY (ICD-300.00) URINARY INCONTINENCE (ICD-788.30) GERD (ICD-530.81) OSTEOARTHRITIS (ICD-715.90) OBESITY (ICD-278.00) DEPRESSION (ICD-311) HYPERLIPIDEMIA (ICD-272.4) HYPERTENSION (ICD-401.9) IDDM (ICD-250.01)  Review of Systems CV:  Denies chest pain or discomfort  and palpitations. Resp:  Denies shortness of breath. MS:  Complains of joint pain; denies joint redness and joint swelling. Endo:  Complains of excessive thirst and excessive urination.  Physical Exam  General:  Well-developed,well-nourished,in no acute distress; alert,appropriate and cooperative throughout examination Head:  Normocephalic and atraumatic without obvious abnormalities. No apparent alopecia or balding. Lungs:  Normal respiratory effort, chest expands symmetrically. Lungs are clear to auscultation, no crackles or wheezes. Heart:  Normal rate and regular rhythm. S1 and S2 normal without gallop, murmur, click, rub or other extra sounds. Msk:  Lt knee no effusion.  TTP medial joint line.  Audible & palp crepitus with mvmt. No ligament instability. Extremities:  1+ left pedal edema.   Neurologic:  alert & oriented X3, sensation intact to light touch, and DTRs symmetrical and normal.   Skin:  Intact without suspicious lesions or rashes Psych:  Cognition and judgment appear intact. Alert and cooperative with normal attention span and concentration. No apparent delusions, illusions, hallucinations   Impression & Recommendations:  Problem # 1:  KNEE PAIN, LEFT [id number]) Assessment New May need referral to ortho.  The following medications were removed from the medication list:    Ibuprofen 800 Mg Tabs (Ibuprofen) .Marland Kitchen... Take 1 tablet by mouth three times a day Her updated medication list for this problem includes:    Aspirin 81 Mg Tbec (Aspirin) .Marland Kitchen... Take 1 tablet by mouth once a day  Flexeril 10 Mg Tabs (Cyclobenzaprine hcl) .Marland Kitchen... Take 1 tab by mouth at bedtime    Diclofenac Sodium 75 Mg Tbec (Diclofenac sodium) .Marland Kitchen... Take 1 two times a day as needed for knee pain  Orders: Miscellaneous Other Radiology (Misc Other Rad)  Problem # 2:  IDDM (ICD-250.01) Assessment: Deteriorated Discussed with pt that she really must be careful with her diet.  That the increase in her  blood sugars seem to correlate with the poor choices in her diet recently & pt agrees.  Advised pt to be very strict with her diet the next 1 1/2 weeks until her appt with Dr Moshe Cipro.  If blood sugars continue to be elevated at that time may need adjustment of her insulin, but would prefer her to work on her diet first.  Her updated medication list for this problem includes:    Glyburide 5 Mg Tabs (Glyburide) .Marland Kitchen... Take 1 tablet by mouth two times a day    Aspirin 81 Mg Tbec (Aspirin) .Marland Kitchen... Take 1 tablet by mouth once a day    Lantus 100 Unit/ml Soln (Insulin glargine) ..... Inject 50 units sub q twice daily    Humalog Mix 75/25 75-25 % Susp (Insulin lispro prot & lispro) ..... Inject 10 units sub q in am and 25 units in pm    Benazepril Hcl 20 Mg Tabs (Benazepril hcl) ..... One tab by mouth once daily  * PT NEEDS PNEUMOVAX IN THE FUTURE.  DID NOT GIVE TODAY DUE TO OTHER INJECTIONS BEING GIVEN FOR KNEE PAIN.  Problem # 3:  HYPERTENSION (ICD-401.9) Assessment: Comment Only  Her updated medication list for this problem includes:    Furosemide 40 Mg Tabs (Furosemide) .Marland Kitchen... Take 1 tablet by mouth every morning    Amlodipine Besylate 10 Mg Tabs (Amlodipine besylate) ..... One tab by mouth once daily    Benazepril Hcl 20 Mg Tabs (Benazepril hcl) ..... One tab by mouth qd  BP today: 130/60 Prior BP: 122/74 (10/26/2009)  Labs Reviewed: K+: 4.4 (10/26/2009) Creat: : 0.76 (10/26/2009)   Chol: 191 (10/26/2009)   HDL: 62 (10/26/2009)   LDL: 107 (10/26/2009)   TG: 111 (10/26/2009)  Complete Medication List: 1)  Calcium 500/d 500-200 Mg-unit Tabs (Calcium carbonate-vitamin d) .... Take 1 tablet by mouth three times a day 2)  Gabapentin 300 Mg Caps (Gabapentin) .... Take 1 tablet by mouth three times a day 3)  Furosemide 40 Mg Tabs (Furosemide) .... Take 1 tablet by mouth every morning 4)  Alprazolam 0.5 Mg Tabs (Alprazolam) .... 1/2 tab in am, 1/2 tab at noon, 2 tabs at bedtime 5)  Glyburide 5 Mg  Tabs (Glyburide) .... Take 1 tablet by mouth two times a day 6)  Aspirin 81 Mg Tbec (Aspirin) .... Take 1 tablet by mouth once a day 7)  Lantus 100 Unit/ml Soln (Insulin glargine) .... Inject 50 units sub q twice daily 8)  Amlodipine Besylate 10 Mg Tabs (Amlodipine besylate) .... One tab by mouth once daily 9)  Humalog Mix 75/25 75-25 % Susp (Insulin lispro prot & lispro) .... Inject 10 units sub q in am and 25 units in pm 10)  Lovastatin 40 Mg Tabs (Lovastatin) .... Take 2 tabs every night at bedtime 11)  Flexeril 10 Mg Tabs (Cyclobenzaprine hcl) .... Take 1 tab by mouth at bedtime 12)  Centrum Silver Tabs (Multiple vitamins-minerals) .... Take 1 tablet by mouth once a day 13)  Potassium 99 Mg Tabs (Potassium) .... Take 1 tablet by mouth once a day  14)  Bd Insulin Syringe Ultrafine 31g X 5/16" 0.3 Ml Misc (Insulin syringe-needle u-100) .... Uad 15)  Benazepril Hcl 20 Mg Tabs (Benazepril hcl) .... One tab by mouth qd 16)  Bd Insulin Syringe Ultrafine 30g X 1/2" 1 Ml Misc (Insulin syringe-needle u-100) .... Uad 17)  Zoloft 50 Mg Tabs (Sertraline hcl) .... Take 1 tablet by mouth three times a day in the am 18)  B Complex-b12 Tabs (B complex vitamins) .... Take 1 tablet by mouth once a day 19)  Diclofenac Sodium 75 Mg Tbec (Diclofenac sodium) .... Take 1 two times a day as needed for knee pain 20)  Lifescan Unistik Ii Lancets Misc (Lancets) .... Two times a day testing  Patient Instructions: 1)  Keep your appt with Dr Moshe Cipro. 2)  I have prescribed Diclofenac for arthritis pain in your knee. 3)  You have received 2 shots today to help with your pain. 4)  You need to discontinue eating sweets, & sodas.  This will help with your blood sugars. 5)  Check your blood sugar in the morning fasting and at bedtime. Bring these readings to your next appt for Dr Moshe Cipro to review. 6)  You need to lose weight. Consider a lower calorie diet and regular exercise.  Prescriptions: LIFESCAN UNISTIK II LANCETS   MISC (LANCETS) two times a day testing  #100 x 2   Entered by:   Baldomero Lamy LPN   Authorized by:   Kennith Gain PA   Signed by:   Baldomero Lamy LPN on [id number]   Method used:   Electronically to        [facility] (retail)       [address], [place]  [address]       Ph: [phone]       Fax: [phone]   RxID:   [id number] DICLOFENAC SODIUM 75 MG TBEC (DICLOFENAC SODIUM) take 1 two times a day as needed for knee pain  #60 x 0   Entered and Authorized by:   Kennith Gain PA   Signed by:   Baldomero Lamy LPN on [id number]   Method used:   Electronically to        [facility] (retail)       [address]       [place], [place]  [address]       Ph: [phone]       Fax: [phone]   RxID:   [id number]   Appended Document: weak and aches- room 1   Medication Administration  Injection # 1:    Medication: Depo- Medrol 80mg     Diagnosis: KNEE PAIN, LEFT (ICD-719.46)    Route: IM    Site: RUOQ gluteus    Exp Date: 12/11    Lot #: [id number]    Mfr: Pharmacia    Patient tolerated injection without complications    Given by: Baldomero Lamy LPN (April 27, 624THL 579FGE PM)  Injection # 2:    Medication: Ketorolac-Toradol 15mg     Diagnosis: KNEE PAIN, LEFT (ICD-719.46)    Route: IM    Site: LUOQ gluteus    Exp Date: 08/25/2011    Lot #: [id number]    Mfr: hospira    Comments: toradol 60mg  given    Patient tolerated injection without complications    Given by: Baldomero Lamy LPN (  January 18, 2010 3:27 PM)  Orders Added: 1)  Depo- Medrol 80mg  [J1040] 2)  Ketorolac-Toradol 15mg  [J1885] 3)  Admin of Therapeutic Inj  intramuscular or subcutaneous [id number]

## 2010-10-26 NOTE — Progress Notes (Signed)
Summary: diabetic shoes  Phone Note Call from Patient   Summary of Call: left  message in the office 2 wks ago wants some diabetic shoes  trying to find out about the paperwork and what to do next call back at [phone] Initial call taken by: Dierdre Harness,  September 26, 2010 11:34 AM  Follow-up for Phone Call        advised sent to ca Follow-up by: Baldomero Lamy LPN,  January  3, [id number] 3:50 PM

## 2010-10-26 NOTE — Progress Notes (Signed)
Summary: please advise  Phone Note Other Incoming   Summary of Call: Laura Atkins, from [facility] called in and she needs a new order for diabetic shoes written for Dec. 16, since that is the office note that they will need sent to them along with the new prescription.  I advised her that Dr. Moshe Cipro wasn't here this week. Initial call taken by: Eliezer Mccoy,  September 27, 2010 4:38 PM  Follow-up for Phone Call        pls casllCA explain that the doc treating her diabetes is [facility] in [place], and thisis who will need to fill out the form etc since he is treating her diabetes now Follow-up by: Tula Nakayama MD,  October 02, 2010 12:58 PM  Additional Follow-up for Phone Call Additional follow up Details #1::        [facility] has been advised to contact Dr Carlis Abbott regarding this matter. Additional Follow-up by: Baldomero Lamy LPN,  January  9, [id number] 2:08 PM

## 2010-10-26 NOTE — Assessment & Plan Note (Signed)
Summary: office visit   Vital Signs:  Patient profile:   70 year old female Menstrual status:  hysterectomy Height:      67.5 inches Weight:      295.25 pounds BMI:     45.72 O2 Sat:      97 % on Room air Pulse rate:   83 / minute Pulse rhythm:   regular Resp:     16 per minute BP sitting:   130 / 60  (left arm)  Vitals Entered By: Baldomero Lamy LPN (December 16, 624THL 11:18 AM)  Nutrition Counseling: Patient's BMI is greater than 25 and therefore counseled on weight management options.  O2 Flow:  Room air CC: follow-up visit Is Patient Diabetic? Yes Did you bring your meter with you today? No   Primary Care Provider:  Tula Nakayama MD  CC:  follow-up visit.  History of Present Illness: Reports  that she is doing fairly well. She and her husband feel that the counselling sessions are potentially helpful, and she plans to continue this at this time. unfortunately her blood sugars sound as if they are even worse than in the past she remains very non compliant with diet.She has an upcoming appt with the endocrinologist responsible for her diabetes management next week.  Denies recent fever or chills. Denies sinus pressure, nasal congestion , ear pain or sore throat. Denies chest congestion, or cough productive of sputum. Denies chest pain, palpitations, PND, orthopnea or leg swelling. Denies abdominal pain, nausea, vomitting, diarrhea or constipation. Denies change in bowel movements or bloody stool. Denies dysuria , frequency, incontinence or hesitancy. c/o increased  joint pain, swelling, and reduced mobilityParticularly affecting the knees. She denies instabilitu and has not fallen.. Denies headaches, vertigo, seizures. Denies uncontrolled  depression, anxiety or insomnia. Denies  rash, lesions, or itch.     Current Medications (verified): 1)  Gabapentin 300 Mg  Caps (Gabapentin) .... Take 1 Tablet By Mouth Three Times A Day 2)  Furosemide 40 Mg  Tabs (Furosemide)  .... Take 1 Tablet By Mouth Every Morning 3)  Alprazolam 0.5 Mg  Tabs (Alprazolam) .... 1/2 Tab in Am, 1/2 Tab At [employer], 2 Tabs At Bedtime 4)  Aspirin 81 Mg  Tbec (Aspirin) .... Take 1 Tablet By Mouth Once A Day 5)  Amlodipine Besylate 10 Mg  Tabs (Amlodipine Besylate) .... One Tab By Mouth Once Daily 6)  Humalog Mix 75/25 75-25 %  Susp (Insulin Lispro Prot & Lispro) .... Inject 10 Units Sub Q in Am and 25 Units in Pm 7)  Lovastatin 40 Mg Tabs (Lovastatin) .... Take 2 Tabs Every Night At Bedtime 8)  Flexeril 10 Mg Tabs (Cyclobenzaprine Hcl) .... Take 1 Tab By Mouth At Bedtime 9)  Centrum Silver  Tabs (Multiple Vitamins-Minerals) .... Take 1 Tablet By Mouth Once A Day 10)  Bd Insulin Syringe Ultrafine 30g X 1/2" 1 Ml Misc (Insulin Syringe-Needle U-100) .... Uad 11)  Benazepril Hcl 20 Mg Tabs (Benazepril Hcl) .... One Tab By Mouth Qd 12)  Zoloft 50 Mg Tabs (Sertraline Hcl) .... Take 1 Tablet By Mouth Three Times A Day in The Am 13)  Diclofenac Sodium 75 Mg Tbec (Diclofenac Sodium) .... Take 1 Two Times A Day As Needed For Knee Pain 14)  Lifescan Unistik Ii Lancets  Misc (Lancets) .... Two Times A Day Testing 15)  Onetouch Ultra Test  Strp (Glucose Blood) .... For Two Times A Day Testing 16)  Humalog Mix 50/50 50-50 % Susp (Insulin Lispro Prot &  Lispro) .... Take 40 Units After Supper Everynight 17)  Hydroxyzine Hcl 25 Mg Tabs (Hydroxyzine Hcl) .... Take 1 Tab By Mouth At Bedtime As Needed 18)  Clotrimazole-Betamethasone 1-0.05 % Crea (Clotrimazole-Betamethasone) .... Apply Twice Daily To Rash On Leg As Needed 19)  Humalog Mix 75/25 75-25 % Susp (Insulin Lispro Prot & Lispro) .... Sliding Scale Coverage Pre Breakfast Per Endocrine 20)  Janumet 50-1000 Mg Tabs (Sitagliptin-Metformin Hcl) .... Take 1 Capsule By Mouth Two Times A Day  Allergies (verified): 1)  ! Darvocet 2)  ! Penicillin 3)  ! Prednisone  Review of Systems      See HPI General:  Complains of fatigue and sleep disorder. Eyes:   Denies discharge, eye pain, and red eye. GU:  Complains of incontinence. MS:  Complains of joint pain, low back pain, mid back pain, and stiffness; right knee pain and stffness, and right leg pain, increased in the past 2 weeks. Psych:  Complains of anxiety, depression, and mental problems; denies panic attacks, suicidal thoughts/plans, thoughts of violence, and unusual visions or sounds. Endo:  Denies excessive thirst and excessive urination; blood sugars often over 200, seeing endo. Heme:  Denies abnormal bruising and bleeding. Allergy:  Complains of seasonal allergies; denies hives or rash and itching eyes.  Physical Exam  General:  Well-developed,obese,in no acute distress; alert,appropriate and cooperative throughout examination HEENT: No facial asymmetry,  EOMI, No sinus tenderness, TM's Clear, oropharynx  pink and moist.   Chest: Clear to auscultation bilaterally.  CVS: S1, S2, No murmurs, No S3.   Abd: Soft, Nontender.  MS: decreased  ROM spine, hips, shoulders and knees.  Ext: No edema.   CNS: CN 2-12 intact, power tone and sensation normal throughout.  Skin: Intact, no visible lesions or rashes.  Psych: Good eye contact, normal affect.  Memory fair, not anxious or depressed appearing.   Diabetes Management Exam:    Foot Exam (with socks and/or shoes not present):       Sensory-Monofilament:          Left foot: diminished          Right foot: diminished       Inspection:          Left foot: abnormal             Comments: bleeding on left 2nd toenail bed, where she picked the nail          Right foot: abnormal             Comments: corn on right 5th toe       Nails:          Left foot: thickened          Right foot: thickened   Impression & Recommendations:  Problem # 1:  KNEE PAIN, LEFT (ICD-719.46) Assessment Deteriorated  Her updated medication list for this problem includes:    Aspirin 81 Mg Tbec (Aspirin) .Marland Kitchen... Take 1 tablet by mouth once a day    Flexeril 10  Mg Tabs (Cyclobenzaprine hcl) .Marland Kitchen... Take 1 tab by mouth at bedtime    Diclofenac Sodium 75 Mg Tbec (Diclofenac sodium) .Marland Kitchen... Take 1 two times a day as needed for knee pain  Orders: Ketorolac-Toradol 15mg  [id number])  Problem # 2:  ANXIETY (ICD-300.00) Assessment: Improved  Her updated medication list for this problem includes:    Alprazolam 0.5 Mg Tabs (Alprazolam) .Marland Kitchen... 1/2 tab in am, 1/2 tab at noon, 2 tabs at bedtime    Zoloft 50  Mg Tabs (Sertraline hcl) .Marland Kitchen... Take 1 tablet by mouth three times a day in the am    Hydroxyzine Hcl 25 Mg Tabs (Hydroxyzine hcl) .Marland Kitchen... Take 1 tab by mouth at bedtime as needed pt to continue therapy  Problem # 3:  DEPRESSION (ICD-311) Assessment: Improved  Her updated medication list for this problem includes:    Alprazolam 0.5 Mg Tabs (Alprazolam) .Marland Kitchen... 1/2 tab in am, 1/2 tab at noon, 2 tabs at bedtime    Zoloft 50 Mg Tabs (Sertraline hcl) .Marland Kitchen... Take 1 tablet by mouth three times a day in the am    Hydroxyzine Hcl 25 Mg Tabs (Hydroxyzine hcl) .Marland Kitchen... Take 1 tab by mouth at bedtime as needed  Discussed treatment options, including trial of antidpressant medication. Will refer to behavioral health. Follow-up call in in 24-48 hours and recheck in 2 weeks, sooner as needed. Patient agrees to call if any worsening of symptoms or thoughts of doing harm arise. Verified that the patient has no suicidal ideation at this time.   Problem # 4:  HYPERTENSION (ICD-401.9) Assessment: Unchanged  Her updated medication list for this problem includes:    Furosemide 40 Mg Tabs (Furosemide) .Marland Kitchen... Take 1 tablet by mouth every morning    Amlodipine Besylate 10 Mg Tabs (Amlodipine besylate) ..... One tab by mouth once daily    Benazepril Hcl 20 Mg Tabs (Benazepril hcl) ..... One tab by mouth qd  Orders: Medicare Electronic Prescription ([id number]) T-Basic Metabolic Panel ([id number])  BP today: 130/60 Prior BP: 124/70 (06/30/2010)  Labs Reviewed: K+: 4.1 (05/16/2010) Creat: :  0.72 (05/16/2010)   Chol: 192 (05/16/2010)   HDL: 62 (05/16/2010)   LDL: 102 (05/16/2010)   TG: 142 (05/16/2010)  Problem # 5:  HYPERLIPIDEMIA (ICD-272.4) Assessment: Unchanged  Her updated medication list for this problem includes:    Lovastatin 40 Mg Tabs (Lovastatin) .Marland Kitchen... Take 2 tabs every night at bedtime  Orders: Medicare Electronic Prescription [phone]) T-Hepatic Function [phone]) T-Lipid Profile [phone]) Low fat dietdiscussed and encouraged  Labs Reviewed: SGOT: 22 (05/16/2010)   SGPT: 21 (05/16/2010)   HDL:62 (05/16/2010), 71 (01/26/2010)  LDL:102 (05/16/2010), 109 (01/26/2010)  Chol:192 (05/16/2010), 199 (01/26/2010)  Trig:142 (05/16/2010), 93 (01/26/2010)  Problem # 6:  IDDM (ICD-250.01) Assessment: Deteriorated  Her updated medication list for this problem includes:    Aspirin 81 Mg Tbec (Aspirin) .Marland Kitchen... Take 1 tablet by mouth once a day    Humalog Mix 75/25 75-25 % Susp (Insulin lispro prot & lispro) ..... Inject 10 units sub q in am and 25 units in pm    Benazepril Hcl 20 Mg Tabs (Benazepril hcl) ..... One tab by mouth qd    Humalog Mix 50/50 50-50 % Susp (Insulin lispro prot & lispro) .Marland Kitchen... Take 40 units after supper everynight    Humalog Mix 75/25 75-25 % Susp (Insulin lispro prot & lispro) ..... Sliding scale coverage pre breakfast per endocrine    Janumet 50-1000 Mg Tabs (Sitagliptin-metformin hcl) .Marland Kitchen... Take 1 capsule by mouth two times a day Patient advised to reduce carbs and sweets, commit to regular physical activity, take meds as prescribed, test blood sugars as directed, and attempt to lose weight , to improve blood sugar control.  Orders: T- Hemoglobin A1C [id number])  Labs Reviewed: Creat: 0.72 (05/16/2010)    Reviewed HgBA1c results: 10.7 (05/16/2010)  11.0 (01/26/2010)  Complete Medication List: 1)  Gabapentin 300 Mg Caps (Gabapentin) .... Take 1 tablet by mouth three times a day 2)  Furosemide 40 Mg Tabs (Furosemide) .Marland KitchenMarland KitchenMarland Kitchen  Take 1 tablet  by mouth every morning 3)  Alprazolam 0.5 Mg Tabs (Alprazolam) .... 1/2 tab in am, 1/2 tab at noon, 2 tabs at bedtime 4)  Aspirin 81 Mg Tbec (Aspirin) .... Take 1 tablet by mouth once a day 5)  Amlodipine Besylate 10 Mg Tabs (Amlodipine besylate) .... One tab by mouth once daily 6)  Humalog Mix 75/25 75-25 % Susp (Insulin lispro prot & lispro) .... Inject 10 units sub q in am and 25 units in pm 7)  Lovastatin 40 Mg Tabs (Lovastatin) .... Take 2 tabs every night at bedtime 8)  Flexeril 10 Mg Tabs (Cyclobenzaprine hcl) .... Take 1 tab by mouth at bedtime 9)  Centrum Silver Tabs (Multiple vitamins-minerals) .... Take 1 tablet by mouth once a day 10)  Bd Insulin Syringe Ultrafine 30g X 1/2" 1 Ml Misc (Insulin syringe-needle u-100) .... Uad 11)  Benazepril Hcl 20 Mg Tabs (Benazepril hcl) .... One tab by mouth qd 12)  Zoloft 50 Mg Tabs (Sertraline hcl) .... Take 1 tablet by mouth three times a day in the am 13)  Diclofenac Sodium 75 Mg Tbec (Diclofenac sodium) .... Take 1 two times a day as needed for knee pain 14)  Lifescan Unistik Ii Lancets Misc (Lancets) .... Two times a day testing 15)  Onetouch Ultra Test Strp (Glucose blood) .... For two times a day testing 16)  Humalog Mix 50/50 50-50 % Susp (Insulin lispro prot & lispro) .... Take 40 units after supper everynight 17)  Hydroxyzine Hcl 25 Mg Tabs (Hydroxyzine hcl) .... Take 1 tab by mouth at bedtime as needed 18)  Clotrimazole-betamethasone 1-0.05 % Crea (Clotrimazole-betamethasone) .... Apply twice daily to rash on leg as needed 19)  Humalog Mix 75/25 75-25 % Susp (Insulin lispro prot & lispro) .... Sliding scale coverage pre breakfast per endocrine 20)  Janumet 50-1000 Mg Tabs (Sitagliptin-metformin hcl) .... Take 1 capsule by mouth two times a day  Other Orders: T-TSH [id number]) Tdap => 93yrs IM [id number]) Admin 1st Vaccine [id number]) Pneumococcal Vaccine [id number]) Admin of Any Addtl Vaccine [id number])  Patient Instructions: 1)  Please  schedule a follow-up appointment in 4 months. 2)  It is important that you exercise regularly at least 20 minutes 5 times a week. If you develop chest pain, have severe difficulty breathing, or feel very tired , stop exercising immediately and seek medical attention. 3)  You need to lose weight. Consider a lower calorie diet and regular exercise.  4)  BMP prior to visit, ICD-9: 5)  Hepatic Panel prior to visit, ICD-9: 6)  Lipid Panel prior to visit, ICD-9: 7)  TSH prior to visit, ICD-9:   fasting today 8)  HbgA1C prior to visit, ICD-9 9)  pneumovac today  and TdaP today 10)  pls stop picking the toenails you may get infection in your legs and blood.   Medication Administration  Injection # 1:    Medication: Ketorolac-Toradol 15mg     Diagnosis: KNEE PAIN, LEFT (ICD-719.46)    Route: IM    Site: RUOQ gluteus    Exp Date: 02/23/2012    Lot #: [id number]    Mfr: [email or web address]    Comments: toradol 60mg  given    Patient tolerated injection without complications    Given by: Baldomero Lamy LPN (December 16, 624THL 2:15 PM)  Orders Added: 1)  Est. Patient Level IV [id number] 2)  Medicare Electronic Prescription [id number])  T-Basic Metabolic Panel 0000000 4)  T-Hepatic Function [80076-22960] 5)  T-Lipid Profile [id number])  T-  Hemoglobin A1C [83036-23375] 7)  T-TSH [id number] 8)  Ketorolac-Toradol 15mg  [J1885] 9)  Tdap => 27yrs IM [90715] 10)  Admin 1st Vaccine [90471] 11)  Pneumococcal Vaccine [90732] 12)  Admin of Any Addtl Vaccine [id number]   Immunizations Administered:  Tetanus Vaccine:    Vaccine Type: Tdap    Site: left deltoid    Mfr: GlaxoSmithKline    Dose: 0.5 ml    Route: IM    Given by: Baldomero Lamy LPN    Exp. Date: 07/13/2012    Lot #: [id number]    VIS given: 08/11/08 version given September 08, 2010.  Pneumonia Vaccine:    Vaccine Type: Pneumovax    Site: right deltoid    Mfr: Merck    Dose: 0.5 ml    Route: IM    Given by: Baldomero Lamy LPN    Exp. Date:  12/11/2011    Lot #: [id number]    VIS given: 08/29/09 version given September 08, 2010.   Immunizations Administered:  Tetanus Vaccine:    Vaccine Type: Tdap    Site: left deltoid    Mfr: GlaxoSmithKline    Dose: 0.5 ml    Route: IM    Given by: Baldomero Lamy LPN    Exp. Date: 07/13/2012    Lot #: [id number]    VIS given: 08/11/08 version given September 08, 2010.  Pneumonia Vaccine:    Vaccine Type: Pneumovax    Site: right deltoid    Mfr: Merck    Dose: 0.5 ml    Route: IM    Given by: Baldomero Lamy LPN    Exp. Date: 12/11/2011    Lot #: [id number]    VIS given: 08/29/09 version given September 08, 2010.   Medication Administration  Injection # 1:    Medication: Ketorolac-Toradol 15mg     Diagnosis: KNEE PAIN, LEFT (ICD-719.46)    Route: IM    Site: RUOQ gluteus    Exp Date: 02/23/2012    Lot #: [id number]    Mfr: [email or web address]    Comments: toradol 60mg  given    Patient tolerated injection without complications    Given by: Baldomero Lamy LPN (December 16, 624THL 2:15 PM)  Orders Added: 1)  Est. Patient Level IV [99214] 2)  Medicare Electronic Prescription [id number] 3)  T-Basic Metabolic Panel 0000000 4)  T-Hepatic Function [80076-22960] 5)  T-Lipid Profile [80061-22930] 6)  T- Hemoglobin A1C [83036-23375] 7)  T-TSH [id number] 8)  Ketorolac-Toradol 15mg  [J1885] 9)  Tdap => 67yrs IM [90715] 10)  Admin 1st Vaccine [90471] 11)  Pneumococcal Vaccine [90732] 12)  Admin of Any Addtl Vaccine [id number]

## 2010-10-26 NOTE — Letter (Signed)
Summary: diabetic shoes  diabetic shoes   Imported By: Dierdre Harness 09/04/2010 08:08:29  _____________________________________________________________________  External Attachment:    Type:   Image     Comment:   External Document

## 2010-10-26 NOTE — Letter (Signed)
Summary: therapeutic shoes  therapeutic shoes   Imported By: Dierdre Harness 10/06/2010 13:26:23  _____________________________________________________________________  External Attachment:    Type:   Image     Comment:   External Document

## 2010-10-31 ENCOUNTER — Encounter (HOSPITAL_COMMUNITY): Payer: 59 | Admitting: Psychiatry

## 2010-11-01 ENCOUNTER — Telehealth: Payer: Self-pay | Admitting: Family Medicine

## 2010-11-01 NOTE — Progress Notes (Signed)
Summary: fell out of bed  Phone Note Call from Patient   Summary of Call: pt fell out of the bed and has cut over right eye and nose. Pt complaining with right knee and abdominal pain. spoke with nurse and pts daughter was advise to go to ER. [phone] Initial call taken by: Lenn Cal,  October 24, 2010 8:21 AM  Follow-up for Phone Call        noted and agree, advised er Follow-up by: Baldomero Lamy LPN,  January 31, [id number] 9:01 AM

## 2010-11-07 ENCOUNTER — Encounter: Payer: Self-pay | Admitting: Family Medicine

## 2010-11-07 ENCOUNTER — Ambulatory Visit (INDEPENDENT_AMBULATORY_CARE_PROVIDER_SITE_OTHER): Payer: Medicare Other | Admitting: Family Medicine

## 2010-11-07 DIAGNOSIS — R269 Unspecified abnormalities of gait and mobility: Secondary | ICD-10-CM | POA: Insufficient documentation

## 2010-11-07 DIAGNOSIS — M549 Dorsalgia, unspecified: Secondary | ICD-10-CM

## 2010-11-07 DIAGNOSIS — E109 Type 1 diabetes mellitus without complications: Secondary | ICD-10-CM

## 2010-11-07 DIAGNOSIS — I10 Essential (primary) hypertension: Secondary | ICD-10-CM

## 2010-11-07 DIAGNOSIS — R51 Headache: Secondary | ICD-10-CM

## 2010-11-07 DIAGNOSIS — H9203 Otalgia, bilateral: Secondary | ICD-10-CM | POA: Insufficient documentation

## 2010-11-07 DIAGNOSIS — R519 Headache, unspecified: Secondary | ICD-10-CM | POA: Insufficient documentation

## 2010-11-09 NOTE — Progress Notes (Signed)
Summary: speak with nurse  Phone Note Call from Patient   Summary of Call: pt needs to speak with nurse about getting results on x-ray. [phone] pt wants to see doctor. says she is still having pain Initial call taken by: Lenn Cal,  November 01, 2010 8:57 AM  Follow-up for Phone Call        gave patient results from xrays done at ER (all were negative) and scheduling to give patient first available appt Follow-up by: Baldomero Lamy LPN,  February  8, [id number] 9:29 AM

## 2010-11-16 ENCOUNTER — Ambulatory Visit (INDEPENDENT_AMBULATORY_CARE_PROVIDER_SITE_OTHER): Payer: Medicare Other | Admitting: Otolaryngology

## 2010-11-21 ENCOUNTER — Other Ambulatory Visit: Payer: Self-pay | Admitting: Family Medicine

## 2010-11-21 DIAGNOSIS — R51 Headache: Secondary | ICD-10-CM

## 2010-11-21 NOTE — Assessment & Plan Note (Signed)
Summary: office visit   Vital Signs:  Patient profile:   70 year old female Menstrual status:  hysterectomy Height:      67.5 inches Weight:      285.50 pounds BMI:     44.21 O2 Sat:      95 % Pulse rate:   73 / minute Pulse rhythm:   regular Resp:     16 per minute BP sitting:   124 / 64  (left arm) Cuff size:   xl  Vitals Entered By: Kate Sable LPN (February 14, [id number] 3:23 PM)  Nutrition Counseling: Patient's BMI is greater than 25 and therefore counseled on weight management options. CC: She fell on the 7th and cut her right eye and bruises on lower right leg   Primary Care Provider:  Tula Nakayama MD  CC:  She fell on the 7th and cut her right eye and bruises on lower right leg.  History of Present Illness: Pt states she fell out off bed accidentally 1 week ago. she was taken to the hospital and d/c now states she has black and blue on he left leg, states she had semi concussion, and rubs her abdomen stating she feels as though everything  in her stomach shifted. She reports increased headache and   unsteady gait following the fall and is concerned about this also. She continues in therapy for chronic depression, and finds this helpful. She continues to have uncontrolled blood sugars which are seldom under 200, her diet continues top have  a high sugar content, per the reportoing of her grand-daughter who accompanies her , she still sees the endocrinologist.  Denies recent fever or chills. Denies sinus pressure, nasal congestion , ear pain or sore throat. Denies chest congestion, or cough productive of sputum. Denies chest pain, palpitations, PND, orthopnea or leg swelling. Denies abdominal pain, nausea, vomitting, diarrhea or constipation. Denies change in bowel movements or bloody stool. Denies dysuria , frequency,  or hesitancy.  Denies headaches, vertigo, seizures. . Denies  rash, lesions, or itch.      Current Medications (verified): 1)  Gabapentin 300 Mg   Caps (Gabapentin) .... Take 1 Tablet By Mouth Three Times A Day 2)  Furosemide 40 Mg  Tabs (Furosemide) .... Take 1 Tablet By Mouth Every Morning 3)  Alprazolam 0.5 Mg  Tabs (Alprazolam) .... 1/2 Tab in Am, 1/2 Tab At [employer], 2 Tabs At Bedtime 4)  Aspirin 81 Mg  Tbec (Aspirin) .... Take 1 Tablet By Mouth Once A Day 5)  Amlodipine Besylate 10 Mg  Tabs (Amlodipine Besylate) .... One Tab By Mouth Once Daily 6)  Lovastatin 40 Mg Tabs (Lovastatin) .... Take 2 Tabs Every Night At Bedtime 7)  Centrum Silver  Tabs (Multiple Vitamins-Minerals) .... Take 1 Tablet By Mouth Once A Day 8)  Bd Insulin Syringe Ultrafine 30g X 1/2" 1 Ml Misc (Insulin Syringe-Needle U-100) .... Uad 9)  Benazepril Hcl 20 Mg Tabs (Benazepril Hcl) .... One Tab By Mouth Qd 10)  Zoloft 50 Mg Tabs (Sertraline Hcl) .... Take 1 Tablet By Mouth Three Times A Day in The Am 11)  Diclofenac Sodium 75 Mg Tbec (Diclofenac Sodium) .... Take 1 Two Times A Day As Needed For Knee Pain 12)  Lifescan Unistik Ii Lancets  Misc (Lancets) .... Two Times A Day Testing 13)  Onetouch Ultra Test  Strp (Glucose Blood) .... For Two Times A Day Testing 14)  Humalog Mix 75/25 75-25 % Susp (Insulin Lispro Prot & Lispro) .... Sliding Scale  Coverage Pre Breakfast Per Endocrine  Allergies (verified): 1)  ! Darvocet 2)  ! Penicillin 3)  ! Prednisone  Review of Systems      See HPI General:  Complains of malaise, sleep disorder, and weakness. Eyes:  Complains of vision loss-both eyes. ENT:  Complains of earache; denies nosebleeds, postnasal drainage, sinus pressure, and sore throat; chronic left  earache. GU:  Complains of incontinence. MS:  Complains of joint pain, low back pain, mid back pain, and stiffness; increased back pain and spasm following recent fall. Neuro:  Complains of disturbances in coordination, headaches, poor balance, and sensation of room spinning; increased headache and unsteady gait following a recent fall off her bed. chronic vertigo with  left earache. Psych:  Complains of anxiety, depression, and mental problems; denies suicidal thoughts/plans, thoughts of violence, and unusual visions or sounds; improved with psychotherapy. Endo:  Complains of excessive thirst and excessive urination. Heme:  Complains of abnormal bruising; denies bleeding; bruising on her leg where she recently fell and hit it. Allergy:  Complains of seasonal allergies.  Physical Exam  General:  Well-developed,obese,in no acute distress; alert,appropriate and cooperative throughout examination HEENT: No facial asymmetry,  EOMI, No sinus tenderness, TM's Clear, oropharynx  pink and moist.   Chest: Clear to auscultation bilaterally.  CVS: S1, S2, No murmurs, No S3.   Abd: Soft, Nontender.  MS: decreased ROM spine, hips, shoulders and knees.  Ext: No edema.   CNS: CN 2-12 intact, power and  tone normal decreased sensation in feet  Skin: Intact, no skin breakdown, bruising present on the right  leg, just below her knee where she recently hit it .  Psych: Good eye contact, normal affect.  Memory loss, not anxious or depressed appearing.    Impression & Recommendations:  Problem # 1:  EAR PAIN, LEFT (ICD-388.70) Assessment Deteriorated  Future Orders: ENT Referral (ENT) ... 11/10/2010  Problem # 2:  UNSTEADY GAIT (ICD-781.2) Assessment: Comment Only  Orders: Radiology Referral (Radiology)  Problem # 3:  HEADACHE (ICD-784.0) Assessment: Comment Only  Her updated medication list for this problem includes:    Aspirin 81 Mg Tbec (Aspirin) .Marland Kitchen... Take 1 tablet by mouth once a day    Diclofenac Sodium 75 Mg Tbec (Diclofenac sodium) .Marland Kitchen... Take 1 two times a day as needed for knee pain    Diclofenac Sodium 75 Mg Tbec (Diclofenac sodium) .Marland Kitchen... Take 1 tablet by mouth two times a day  Orders: Radiology Referral (Radiology)  Headache diary reviewed.  Problem # 4:  BACK PAIN, CHRONIC (ICD-724.5) Assessment: Deteriorated  The following medications  were removed from the medication list:    Flexeril 10 Mg Tabs (Cyclobenzaprine hcl) .Marland Kitchen... Take 1 tab by mouth at bedtime Her updated medication list for this problem includes:    Aspirin 81 Mg Tbec (Aspirin) .Marland Kitchen... Take 1 tablet by mouth once a day    Diclofenac Sodium 75 Mg Tbec (Diclofenac sodium) .Marland Kitchen... Take 1 two times a day as needed for knee pain    Diclofenac Sodium 75 Mg Tbec (Diclofenac sodium) .Marland Kitchen... Take 1 tablet by mouth two times a day    Zanaflex 4 Mg Caps (Tizanidine hcl) .Marland Kitchen... Take 1 capsule by mouth at bedtime for 2 weeks  Orders: Ketorolac-Toradol 15mg  [id number]) Admin of Therapeutic Inj  intramuscular or subcutaneous [id number]) Medicare Electronic Prescription [id number])  Problem # 5:  HYPERTENSION (ICD-401.9) Assessment: Unchanged  Her updated medication list for this problem includes:    Furosemide 40 Mg Tabs (Furosemide) .Marland Kitchen... Take 1  tablet by mouth every morning    Amlodipine Besylate 10 Mg Tabs (Amlodipine besylate) ..... One tab by mouth once daily    Benazepril Hcl 20 Mg Tabs (Benazepril hcl) ..... One tab by mouth qd  BP today: 124/64 Prior BP: 130/60 (09/08/2010)  Labs Reviewed: K+: 4.7 (09/08/2010) Creat: : 0.84 (09/08/2010)   Chol: 200 (09/08/2010)   HDL: 65 (09/08/2010)   LDL: 108 (09/08/2010)   TG: 137 (09/08/2010)  Problem # 6:  IDDM (ICD-250.01) Assessment: Comment Only  The following medications were removed from the medication list:    Humalog Mix 75/25 75-25 % Susp (Insulin lispro prot & lispro) ..... Inject 10 units sub q in am and 25 units in pm    Humalog Mix 50/50 50-50 % Susp (Insulin lispro prot & lispro) .Marland Kitchen... Take 40 units after supper everynight    Janumet 50-1000 Mg Tabs (Sitagliptin-metformin hcl) .Marland Kitchen... Take 1 capsule by mouth two times a day Her updated medication list for this problem includes:    Aspirin 81 Mg Tbec (Aspirin) .Marland Kitchen... Take 1 tablet by mouth once a day    Benazepril Hcl 20 Mg Tabs (Benazepril hcl) ..... One tab by mouth qd     Humalog Mix 75/25 75-25 % Susp (Insulin lispro prot & lispro) ..... Sliding scale coverage pre breakfast per endocrine  Labs Reviewed: Creat: 0.84 (09/08/2010)    Reviewed HgBA1c results: 13.2 (09/08/2010)  10.7 (05/16/2010)  Complete Medication List: 1)  Gabapentin 300 Mg Caps (Gabapentin) .... Take 1 tablet by mouth three times a day 2)  Furosemide 40 Mg Tabs (Furosemide) .... Take 1 tablet by mouth every morning 3)  Alprazolam 0.5 Mg Tabs (Alprazolam) .... 1/2 tab in am, 1/2 tab at noon, 2 tabs at bedtime 4)  Aspirin 81 Mg Tbec (Aspirin) .... Take 1 tablet by mouth once a day 5)  Amlodipine Besylate 10 Mg Tabs (Amlodipine besylate) .... One tab by mouth once daily 6)  Lovastatin 40 Mg Tabs (Lovastatin) .... Take 2 tabs every night at bedtime 7)  Centrum Silver Tabs (Multiple vitamins-minerals) .... Take 1 tablet by mouth once a day 8)  Bd Insulin Syringe Ultrafine 30g X 1/2" 1 Ml Misc (Insulin syringe-needle u-100) .... Uad 9)  Benazepril Hcl 20 Mg Tabs (Benazepril hcl) .... One tab by mouth qd 10)  Zoloft 50 Mg Tabs (Sertraline hcl) .... Take 1 tablet by mouth three times a day in the am 11)  Diclofenac Sodium 75 Mg Tbec (Diclofenac sodium) .... Take 1 two times a day as needed for knee pain 12)  Lifescan Unistik Ii Lancets Misc (Lancets) .... Two times a day testing 13)  Onetouch Ultra Test Strp (Glucose blood) .... For two times a day testing 14)  Humalog Mix 75/25 75-25 % Susp (Insulin lispro prot & lispro) .... Sliding scale coverage pre breakfast per endocrine 15)  Diclofenac Sodium 75 Mg Tbec (Diclofenac sodium) .... Take 1 tablet by mouth two times a day 16)  Zanaflex 4 Mg Caps (Tizanidine hcl) .... Take 1 capsule by mouth at bedtime for 2 weeks  Patient Instructions: 1)  f/u as before  2)  You will get an injection of toradol for pain. 3)  Meds are sent in for pain and muscle spasm. 4)  Apply a cool compress to bruised right leg. 5)  pls establish safety in your bed 6)  you  will be referred for a brain scan re headache and unsteady gait. 7)  you will be referred to eNT for left  ear pain and vertigo Prescriptions: ZANAFLEX 4 MG CAPS (TIZANIDINE HCL) Take 1 capsule by mouth at bedtime for 2 weeks  #14 x 0   Entered and Authorized by:   Tula Nakayama MD   Signed by:   Tula Nakayama MD on 11/07/2010   Method used:   Electronically to        [facility] (retail)       [address], [place]  [address]       Ph: [phone]       Fax: [phone]   RxID:   [id number] DICLOFENAC SODIUM 75 MG TBEC (DICLOFENAC SODIUM) Take 1 tablet by mouth two times a day  #60 x 3   Entered and Authorized by:   Tula Nakayama MD   Signed by:   Tula Nakayama MD on 11/07/2010   Method used:   Electronically to        [facility] (retail)       [address]       [place], [place]  [address]       Ph: [phone]       Fax: [phone]   RxID:   [id number]    Medication Administration  Injection # 1:    Medication: Ketorolac-Toradol 15mg     Diagnosis: BACK PAIN, CHRONIC (ICD-724.5)    Route: IM    Site: RUOQ gluteus    Exp Date: 02/23/2012    Lot #: [id number]    Mfr: [email or web address]    Comments: toradol 60mg  given    Patient tolerated injection without complications    Given by: Baldomero Lamy LPN (February 14, [id number] 4:40 PM)  Orders Added: 1)  Est. Patient Level IV [id number] 2)  Radiology Referral [Radiology] 3)  Ketorolac-Toradol 15mg  [J1885] 4)  Admin of Therapeutic Inj  intramuscular or subcutaneous [[id number]] 5)  ENT Referral [ENT] 6)  Medicare Electronic Prescription [id number]     Medication Administration  Injection # 1:    Medication: Ketorolac-Toradol 15mg     Diagnosis: BACK PAIN, CHRONIC (ICD-724.5)    Route: IM    Site: RUOQ gluteus    Exp Date: 02/23/2012    Lot #: [id number]    Mfr: [email or web address]    Comments: toradol 60mg  given    Patient tolerated injection without  complications    Given by: Baldomero Lamy LPN (February 14, [id number] 4:40 PM)  Orders Added: 1)  Est. Patient Level IV [id number] 2)  Radiology Referral [Radiology] 3)  Ketorolac-Toradol 15mg  [J1885] 4)  Admin of Therapeutic Inj  intramuscular or subcutaneous [96372] 5)  ENT Referral [ENT] 6)  Medicare Electronic Prescription [phone]

## 2010-11-22 ENCOUNTER — Ambulatory Visit: Payer: 59 | Admitting: Family Medicine

## 2010-11-22 ENCOUNTER — Encounter: Payer: Self-pay | Admitting: Family Medicine

## 2010-11-23 ENCOUNTER — Encounter (HOSPITAL_COMMUNITY): Payer: Medicare Other | Admitting: Psychiatry

## 2010-11-23 ENCOUNTER — Ambulatory Visit (HOSPITAL_COMMUNITY)
Admission: RE | Admit: 2010-11-23 | Discharge: 2010-11-23 | Disposition: A | Discharge status: Home / Self Care | Payer: Medicare Other | Source: Ambulatory Visit | Attending: Family Medicine | Admitting: Family Medicine

## 2010-11-23 DIAGNOSIS — R42 Dizziness and giddiness: Secondary | ICD-10-CM | POA: Insufficient documentation

## 2010-11-23 DIAGNOSIS — G319 Degenerative disease of nervous system, unspecified: Secondary | ICD-10-CM | POA: Insufficient documentation

## 2010-11-23 DIAGNOSIS — R51 Headache: Secondary | ICD-10-CM | POA: Insufficient documentation

## 2010-11-24 ENCOUNTER — Telehealth (INDEPENDENT_AMBULATORY_CARE_PROVIDER_SITE_OTHER): Payer: Self-pay | Admitting: *Deleted

## 2010-11-24 ENCOUNTER — Ambulatory Visit (HOSPITAL_COMMUNITY): Payer: Medicare Other

## 2010-11-30 ENCOUNTER — Telehealth: Payer: Self-pay | Admitting: Family Medicine

## 2010-11-30 NOTE — Progress Notes (Signed)
Summary: precert number for MRI.  Phone Note Call from Patient   Summary of Call: pt precert number was [id number] Initial call taken by: Lenn Cal,  November 24, 2010 1:15 PM

## 2010-11-30 NOTE — Letter (Signed)
Summary: 1ST NO SHOW  1ST NO SHOW   Imported By: Dierdre Harness 11/22/2010 10:51:42  _____________________________________________________________________  External Attachment:    Type:   Image     Comment:   External Document

## 2010-12-05 ENCOUNTER — Telehealth: Payer: Self-pay | Admitting: Family Medicine

## 2010-12-05 NOTE — Progress Notes (Signed)
Summary: speak with nurse  Phone Note Call from Patient   Summary of Call: needs refill on hydroco/apap5 325mg  [facility] got this in er. pain is in hips and back [phone] Initial call taken by: Lenn Cal,  November 30, 2010 2:03 PM  Follow-up for Phone Call        plks check and document how many she got and when and directions , I need this to make a decision  Follow-up by: Tula Nakayama MD,  December 01, 2010 4:52 AM  Additional Follow-up for Phone Call Additional follow up Details #1::        norco 5/325 one- two every 4-6 hours as needed #20 no refills filled Feb 1 Additional Follow-up by: Baldomero Lamy LPN,  March  9, [id number] 10:56 AM    Additional Follow-up for Phone Call Additional follow up Details #2::    please notify the patient and the pharmacy of the medication change. The new script is entered historically, please send after speaking with the patient.  Follow-up by: Tula Nakayama MD,  December 01, 2010 12:46 PM  New/Updated Medications: NORCO 5-325 MG TABS (HYDROCODONE-ACETAMINOPHEN) Take 1 tablet by mouth once a day as needed for severe pain NORCO 5-325 MG TABS (HYDROCODONE-ACETAMINOPHEN) one tablet once to twice daily as neededfor severe pain, 20 tablets to last 30 days Prescriptions: NORCO 5-325 MG TABS (HYDROCODONE-ACETAMINOPHEN) one tablet once to twice daily as neededfor severe pain, 20 tablets to last 30 days  #20 x 1   Entered by:   Baldomero Lamy LPN   Authorized by:   Tula Nakayama MD   Signed by:   Baldomero Lamy LPN on [id number]   Method used:   Printed then faxed to ...       [facility] (retail)       [address], [place]  [address]       Ph: [phone]       Fax: [phone]   RxID:   [id number] NORCO 5-325 MG TABS (HYDROCODONE-ACETAMINOPHEN) one tablet once to twice daily as neededfor severe pain, 20 tablets to last 30 days  #20 x 1   Entered and Authorized by:   Tula Nakayama MD   Signed by:    Tula Nakayama MD on 12/01/2010   Method used:   Historical   [id number] NORCO 5-325 MG TABS (HYDROCODONE-ACETAMINOPHEN) Take 1 tablet by mouth once a day as needed for severe pain  #30 x 1   Entered and Authorized by:   Tula Nakayama MD   Signed by:   Tula Nakayama MD on 12/01/2010   Method used:   Historical   [id number]  patient aware

## 2010-12-06 ENCOUNTER — Telehealth: Payer: Self-pay | Admitting: Family Medicine

## 2010-12-07 ENCOUNTER — Encounter: Payer: Self-pay | Admitting: Family Medicine

## 2010-12-07 ENCOUNTER — Ambulatory Visit (INDEPENDENT_AMBULATORY_CARE_PROVIDER_SITE_OTHER): Payer: Medicare Other | Admitting: Family Medicine

## 2010-12-07 ENCOUNTER — Encounter (INDEPENDENT_AMBULATORY_CARE_PROVIDER_SITE_OTHER): Payer: Medicare Other | Admitting: Psychiatry

## 2010-12-07 DIAGNOSIS — E785 Hyperlipidemia, unspecified: Secondary | ICD-10-CM

## 2010-12-07 DIAGNOSIS — F329 Major depressive disorder, single episode, unspecified: Secondary | ICD-10-CM

## 2010-12-07 DIAGNOSIS — J209 Acute bronchitis, unspecified: Secondary | ICD-10-CM

## 2010-12-07 DIAGNOSIS — E109 Type 1 diabetes mellitus without complications: Secondary | ICD-10-CM

## 2010-12-07 DIAGNOSIS — I10 Essential (primary) hypertension: Secondary | ICD-10-CM

## 2010-12-12 ENCOUNTER — Telehealth: Payer: Self-pay | Admitting: Family Medicine

## 2010-12-12 NOTE — Progress Notes (Signed)
Summary: sick  Phone Note Call from Patient   Summary of Call: pt is sick and having some trouble breathing when she is in the bed. but when she gets up breathing is better. [id number] pt is taking cough medicine Initial call taken by: Lenn Cal,  December 05, 2010 1:32 PM  Follow-up for Phone Call        daughter states patient very sick, believes she has pneumonia, states she cant understand what her mother is saying advised daughter no appointmenst available to please take patient to ER for eval Follow-up by: Baldomero Lamy LPN,  March 13, [id number] 1:48 PM

## 2010-12-12 NOTE — Progress Notes (Signed)
  Phone Note Call from Patient   Caller: Patient Summary of Call: patient states she has a terrible cold and needs to be seen, advised patient that i spoke with daughter yesterday and advised to go to ER, advised unfortunately no appt available for the next two days, advised patient ER or urgent care.  patient agrees Initial call taken by: Baldomero Lamy LPN,  March 14, [id number] 9:09 AM

## 2010-12-12 NOTE — Telephone Encounter (Signed)
Refill x 2 and let her know

## 2010-12-13 ENCOUNTER — Telehealth: Payer: Self-pay | Admitting: Family Medicine

## 2010-12-13 DIAGNOSIS — J209 Acute bronchitis, unspecified: Secondary | ICD-10-CM | POA: Insufficient documentation

## 2010-12-13 LAB — CONVERTED CEMR LAB
BUN: 10 mg/dL (ref 6–23)
CO2: 30 meq/L (ref 19–32)
Calcium: 9.6 mg/dL (ref 8.4–10.5)
Chloride: 99 meq/L (ref 96–112)
Creatinine, Ser: 0.74 mg/dL (ref 0.40–1.20)
Glucose, Bld: 228 mg/dL — ABNORMAL HIGH (ref 70–99)
Hgb A1c MFr Bld: 14 % — ABNORMAL HIGH (ref <5.7)
Potassium: 4.2 meq/L (ref 3.5–5.3)
Sodium: 140 meq/L (ref 135–145)

## 2010-12-13 NOTE — Telephone Encounter (Signed)
Med was sent to [facility] yesterday. Called patient, no answer

## 2010-12-13 NOTE — Telephone Encounter (Signed)
Spoke with pharmacy and they did not receive med that was sent electronically through [facility], gave verbal order for refill.

## 2010-12-21 NOTE — Assessment & Plan Note (Signed)
Summary: sick   Vital Signs:  Patient profile:   70 year old female Menstrual status:  hysterectomy Height:      67.5 inches Weight:      281 pounds BMI:     43.52 O2 Sat:      96 % on Room air Pulse rate:   66 / minute Pulse rhythm:   regular Resp:     16 per minute BP sitting:   120 / 60  (left arm)  Vitals Entered By: Baldomero Lamy LPN (March 15, [id number] AM)  Nutrition Counseling: Patient's BMI is greater than 25 and therefore counseled on weight management options.  O2 Flow:  Room air CC: congestion, cough Is Patient Diabetic? Yes Comments did not bring meds to ov   Primary Care Provider:  Tula Nakayama MD  CC:  congestion and cough.  History of Present Illness: Chesrt congestion and cough x 3 days, still having left chest wall pain where she fell from the bed but less severe  Denies recent fever  has had   chills. Denies sinus pressure, nasal congestion , ear pain or sore throat.  Denies chest pain, palpitations, PND, orthopnea or leg swelling. Denies abdominal pain, nausea, vomitting, diarrhea or constipation. Denies change in bowel movements or bloody stool. Denies dysuria , frequency, incontinence or hesitancy.  Denies headaches, vertigo, seizures.  Denies  rash, lesions, or itch.     Allergies (verified): 1)  ! Darvocet 2)  ! Penicillin 3)  ! Prednisone  Review of Systems General:  Complains of chills and fatigue. Eyes:  Complains of vision loss-both eyes. Resp:  Complains of cough, shortness of breath, and sputum productive; 3 day history , which is progressing. MS:  Complains of joint pain, low back pain, mid back pain, muscle weakness, and stiffness. Psych:  Complains of anxiety, depression, and mental problems; denies suicidal thoughts/plans, thoughts of violence, and unusual visions or sounds. Endo:  Complains of excessive thirst; denies cold intolerance and heat intolerance. Heme:  Denies abnormal bruising, bleeding, and enlarge lymph  nodes. Allergy:  Complains of seasonal allergies; denies hives or rash and itching eyes.  Physical Exam  General:  Well-developed,obese,in no acute distress; alert,appropriate and cooperative throughout examination HEENT: No facial asymmetry,  EOMI, No sinus tenderness, TM's Clear, oropharynx  pink and moist.   Chest:decraesed air entry bilaterally, scattered crackles and few wheezes CVS: S1, S2, No murmurs, No S3.   Abd: Soft, Nontender.  MS: decreased ROM spine, hips, shoulders and knees.  Ext: No edema.   CNS: CN 2-12 intact, power and  tone normal decreased sensation in feet  Skin: Intact no rashes Psych: Good eye contact, normal affect.  Memory loss, not anxious or depressed appearing.    Impression & Recommendations:  Problem # 1:  ACUTE BRONCHITIS (ICD-466.0) Assessment Comment Only  Her updated medication list for this problem includes:    Doxycycline Hyclate 100 Mg Caps (Doxycycline hyclate) .Marland Kitchen... Take 1 capsule by mouth two times a day    Tessalon Perles 100 Mg Caps (Benzonatate) .Marland Kitchen... Take 1 capsule by mouth three times a day  Orders: Medicare Electronic Prescription [phone])  Problem # 2:  ANXIETY (ICD-300.00) Assessment: Improved  Her updated medication list for this problem includes:    Alprazolam 0.5 Mg Tabs (Alprazolam) .Marland Kitchen... 1/2 tab in am, 1/2 tab at noon, 2 tabs at bedtime    Zoloft 50 Mg Tabs (Sertraline hcl) .Marland Kitchen... Take 1 tablet by mouth three times a day in the am pt in  therapy  Problem # 3:  BACK PAIN, CHRONIC (ICD-724.5) Assessment: Unchanged  Her updated medication list for this problem includes:    Aspirin 81 Mg Tbec (Aspirin) .Marland Kitchen... Take 1 tablet by mouth once a day    Diclofenac Sodium 75 Mg Tbec (Diclofenac sodium) .Marland Kitchen... Take 1 two times a day as needed for knee pain    Diclofenac Sodium 75 Mg Tbec (Diclofenac sodium) .Marland Kitchen... Take 1 tablet by mouth two times a day    Zanaflex 4 Mg Caps (Tizanidine hcl) .Marland Kitchen... Take 1 capsule by mouth at bedtime for 2  weeks    Norco 5-325 Mg Tabs (Hydrocodone-acetaminophen) ..... One tablet once to twice daily as neededfor severe pain, 20 tablets to last 30 days  Problem # 4:  DEPRESSION (ICD-311) Assessment: Improved  Her updated medication list for this problem includes:    Alprazolam 0.5 Mg Tabs (Alprazolam) .Marland Kitchen... 1/2 tab in am, 1/2 tab at noon, 2 tabs at bedtime    Zoloft 50 Mg Tabs (Sertraline hcl) .Marland Kitchen... Take 1 tablet by mouth three times a day in the am  Problem # 5:  IDDM (ICD-250.01) Assessment: Deteriorated  Her updated medication list for this problem includes:    Aspirin 81 Mg Tbec (Aspirin) .Marland Kitchen... Take 1 tablet by mouth once a day    Benazepril Hcl 20 Mg Tabs (Benazepril hcl) ..... One tab by mouth qd    Humalog Mix 75/25 75-25 % Susp (Insulin lispro prot & lispro) ..... Sliding scale coverage pre breakfast per endocrine currently followed by endocrine Orders: T- Hemoglobin A1C [id number])  Labs Reviewed: Creat: 0.84 (09/08/2010)    Reviewed HgBA1c results: 13.2 (09/08/2010)  10.7 (05/16/2010)  Complete Medication List: 1)  Gabapentin 300 Mg Caps (Gabapentin) .... Take 1 tablet by mouth three times a day 2)  Furosemide 40 Mg Tabs (Furosemide) .... Take 1 tablet by mouth every morning 3)  Alprazolam 0.5 Mg Tabs (Alprazolam) .... 1/2 tab in am, 1/2 tab at noon, 2 tabs at bedtime 4)  Aspirin 81 Mg Tbec (Aspirin) .... Take 1 tablet by mouth once a day 5)  Amlodipine Besylate 10 Mg Tabs (Amlodipine besylate) .... One tab by mouth once daily 6)  Lovastatin 40 Mg Tabs (Lovastatin) .... Take 2 tabs every night at bedtime 7)  Centrum Silver Tabs (Multiple vitamins-minerals) .... Take 1 tablet by mouth once a day 8)  Bd Insulin Syringe Ultrafine 30g X 1/2" 1 Ml Misc (Insulin syringe-needle u-100) .... Uad 9)  Benazepril Hcl 20 Mg Tabs (Benazepril hcl) .... One tab by mouth qd 10)  Zoloft 50 Mg Tabs (Sertraline hcl) .... Take 1 tablet by mouth three times a day in the am 11)  Diclofenac  Sodium 75 Mg Tbec (Diclofenac sodium) .... Take 1 two times a day as needed for knee pain 12)  Lifescan Unistik Ii Lancets Misc (Lancets) .... Two times a day testing 13)  Onetouch Ultra Test Strp (Glucose blood) .... For two times a day testing 14)  Humalog Mix 75/25 75-25 % Susp (Insulin lispro prot & lispro) .... Sliding scale coverage pre breakfast per endocrine 15)  Diclofenac Sodium 75 Mg Tbec (Diclofenac sodium) .... Take 1 tablet by mouth two times a day 16)  Zanaflex 4 Mg Caps (Tizanidine hcl) .... Take 1 capsule by mouth at bedtime for 2 weeks 17)  Norco 5-325 Mg Tabs (Hydrocodone-acetaminophen) .... One tablet once to twice daily as neededfor severe pain, 20 tablets to last 30 days 18)  Doxycycline Hyclate 100 Mg Caps (Doxycycline  hyclate) .... Take 1 capsule by mouth two times a day 19)  Tessalon Perles 100 Mg Caps (Benzonatate) .... Take 1 capsule by mouth three times a day  Other Orders: T-Basic Metabolic Panel ([id number])  Patient Instructions: 1)  F/U as before. 2)  You are being treated for acute bronchitis. 3)  Meds are sent in. 4)  your blood sugars are too high. 5)  You need to get a new meter , pls discuss with Dr Iran Planas. 6)  hBA1C and chem 7 today and I will send the results to drclarke. Prescriptions: TESSALON PERLES 100 MG CAPS (BENZONATATE) Take 1 capsule by mouth three times a day  #30 x 0   Entered and Authorized by:   Tula Nakayama MD   Signed by:   Tula Nakayama MD on 12/07/2010   Method used:   Electronically to        [facility] (retail)       [address], [place]  [address]       Ph: [phone]       Fax: [phone]   RxID:   [id number] DOXYCYCLINE HYCLATE 100 MG CAPS (DOXYCYCLINE HYCLATE) Take 1 capsule by mouth two times a day  #20 x 0   Entered and Authorized by:   Tula Nakayama MD   Signed by:   Tula Nakayama MD on 12/07/2010   Method used:   Electronically to         [facility] (retail)       [address], [place]  [address]       Ph: [phone]       Fax: [phone]   RxID:   [id number]    Orders Added: 1)  Est. Patient Level IV [id number] 2)  T-Basic Metabolic Panel 0000000 3)  T- Hemoglobin A1C [[id number]] 4)  Medicare Electronic Prescription [id number]     Orders Added: 1)  Est. Patient Level IV [id number] 2)  T-Basic Metabolic Panel 0000000 3)  T- Hemoglobin A1C [[id number]] 4)  Medicare Electronic Prescription [id number]

## 2010-12-29 ENCOUNTER — Encounter (HOSPITAL_COMMUNITY): Payer: Medicare Other | Admitting: Psychiatry

## 2011-01-02 ENCOUNTER — Ambulatory Visit (INDEPENDENT_AMBULATORY_CARE_PROVIDER_SITE_OTHER): Payer: Medicare Other | Admitting: Psychiatry

## 2011-01-02 DIAGNOSIS — F329 Major depressive disorder, single episode, unspecified: Secondary | ICD-10-CM
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
## 2011-01-03 ENCOUNTER — Encounter (INDEPENDENT_AMBULATORY_CARE_PROVIDER_SITE_OTHER): Payer: Medicare Other | Admitting: Psychiatry

## 2011-01-03 DIAGNOSIS — F3289 Other specified depressive episodes: Secondary | ICD-10-CM

## 2011-01-03 DIAGNOSIS — F329 Major depressive disorder, single episode, unspecified: Secondary | ICD-10-CM

## 2011-01-03 DIAGNOSIS — F411 Generalized anxiety disorder: Secondary | ICD-10-CM

## 2011-01-05 ENCOUNTER — Encounter: Payer: Self-pay | Admitting: Family Medicine

## 2011-01-08 ENCOUNTER — Ambulatory Visit: Payer: Self-pay | Admitting: Family Medicine

## 2011-01-09 ENCOUNTER — Encounter: Payer: Self-pay | Admitting: Family Medicine

## 2011-01-09 ENCOUNTER — Ambulatory Visit (INDEPENDENT_AMBULATORY_CARE_PROVIDER_SITE_OTHER): Payer: Medicare Other | Admitting: Family Medicine

## 2011-01-09 VITALS — BP 128/60 | HR 75 | Resp 16 | Ht 68.0 in | Wt 282.0 lb

## 2011-01-09 DIAGNOSIS — E785 Hyperlipidemia, unspecified: Secondary | ICD-10-CM

## 2011-01-09 DIAGNOSIS — E119 Type 2 diabetes mellitus without complications: Secondary | ICD-10-CM

## 2011-01-09 DIAGNOSIS — E109 Type 1 diabetes mellitus without complications: Secondary | ICD-10-CM

## 2011-01-09 DIAGNOSIS — IMO0001 Reserved for inherently not codable concepts without codable children: Secondary | ICD-10-CM

## 2011-01-09 DIAGNOSIS — M1990 Unspecified osteoarthritis, unspecified site: Secondary | ICD-10-CM

## 2011-01-09 DIAGNOSIS — I10 Essential (primary) hypertension: Secondary | ICD-10-CM

## 2011-01-09 DIAGNOSIS — E669 Obesity, unspecified: Secondary | ICD-10-CM

## 2011-01-09 DIAGNOSIS — F329 Major depressive disorder, single episode, unspecified: Secondary | ICD-10-CM

## 2011-01-09 LAB — COMPREHENSIVE METABOLIC PANEL
ALT: 35 U/L (ref 0–35)
AST: 43 U/L — ABNORMAL HIGH (ref 0–37)
Albumin: 4 g/dL (ref 3.5–5.2)
Alkaline Phosphatase: 87 U/L (ref 39–117)
BUN: 13 mg/dL (ref 6–23)
CO2: 29 mEq/L (ref 19–32)
Calcium: 8.9 mg/dL (ref 8.4–10.5)
Chloride: 97 mEq/L (ref 96–112)
Creatinine, Ser: 0.8 mg/dL (ref 0.4–1.2)
GFR calc Af Amer: >60 mL/min (ref >60)
GFR calc non Af Amer: >60 mL/min (ref >60)
Glucose, Bld: 229 mg/dL — ABNORMAL HIGH (ref 70–99)
Potassium: 4.2 mEq/L (ref 3.5–5.1)
Sodium: 139 mEq/L (ref 135–145)
Total Bilirubin: 0.5 mg/dL (ref 0.3–1.2)
Total Protein: 7.8 g/dL (ref 6.0–8.3)

## 2011-01-09 LAB — LIPASE, BLOOD: Lipase: 44 U/L (ref 11–59)

## 2011-01-09 LAB — POCT URINALYSIS DIPSTICK
Bilirubin, UA: NEGATIVE
Glucose, UA: >=1000
Ketones, UA: NEGATIVE
Leukocytes, UA: NEGATIVE
Nitrite, UA: NEGATIVE
Protein, UA: NEGATIVE
Spec Grav, UA: 1.01
Urobilinogen, UA: 0.02
pH, UA: 5.5

## 2011-01-09 LAB — CBC
HCT: 36.6 % (ref 36.0–46.0)
Hemoglobin: 11.6 g/dL — ABNORMAL LOW (ref 12.0–15.0)
MCHC: 31.6 g/dL (ref 30.0–36.0)
MCV: 68.2 fL — ABNORMAL LOW (ref 78.0–100.0)
Platelets: 181 10*3/uL (ref 150–400)
RBC: 5.37 MIL/uL — ABNORMAL HIGH (ref 3.87–5.11)
RDW: 15.5 % (ref 11.5–15.5)
WBC: 6.9 10*3/uL (ref 4.0–10.5)

## 2011-01-09 LAB — DIFFERENTIAL
Basophils Absolute: 0 10*3/uL (ref 0.0–0.1)
Basophils Relative: 0 % (ref 0–1)
Eosinophils Absolute: 0.2 10*3/uL (ref 0.0–0.7)
Eosinophils Relative: 3 % (ref 0–5)
Lymphocytes Relative: 9 % — ABNORMAL LOW (ref 12–46)
Lymphs Abs: 0.6 10*3/uL — ABNORMAL LOW (ref 0.7–4.0)
Monocytes Absolute: 0.1 10*3/uL (ref 0.1–1.0)
Monocytes Relative: 2 % — ABNORMAL LOW (ref 3–12)
Neutro Abs: 6 10*3/uL (ref 1.7–7.7)
Neutrophils Relative %: 87 % — ABNORMAL HIGH (ref 43–77)

## 2011-01-09 LAB — GLUCOSE, POCT (MANUAL RESULT ENTRY): POC Glucose: 377

## 2011-01-09 LAB — GLUCOSE, CAPILLARY: Glucose-Capillary: 220 mg/dL — ABNORMAL HIGH (ref 70–99)

## 2011-01-09 MED ORDER — INSULIN ASPART 100 UNIT/ML ~~LOC~~ SOLN
5.0000 [IU] | Freq: Once | SUBCUTANEOUS | Status: AC
  Administered 2011-01-09: 5 [IU] via SUBCUTANEOUS

## 2011-01-09 NOTE — Patient Instructions (Signed)
F/u in 6 weeks.  You are being referred to Dr. Dorris Fetch about your blood sugar , I will let Dr Loletta Specter know

## 2011-01-10 ENCOUNTER — Encounter: Payer: Self-pay | Admitting: Family Medicine

## 2011-01-16 ENCOUNTER — Encounter (INDEPENDENT_AMBULATORY_CARE_PROVIDER_SITE_OTHER): Payer: Medicare Other | Admitting: Psychiatry

## 2011-01-16 DIAGNOSIS — F329 Major depressive disorder, single episode, unspecified: Secondary | ICD-10-CM

## 2011-01-22 ENCOUNTER — Encounter: Payer: Self-pay | Admitting: Family Medicine

## 2011-01-22 MED ORDER — PRAVASTATIN SODIUM 40 MG PO TABS: 40.0000 mg | ORAL_TABLET | Freq: Every evening | ORAL | Status: DC

## 2011-01-22 NOTE — Assessment & Plan Note (Signed)
Stable and improving with therapy,  No med change

## 2011-01-22 NOTE — Assessment & Plan Note (Signed)
Unchanged , weight loss encouraged

## 2011-01-22 NOTE — Assessment & Plan Note (Signed)
Deteriorated, refer to local endocrinologist hopefully pt will be more failthful in keeping appts and following treatment plan

## 2011-01-22 NOTE — Assessment & Plan Note (Signed)
Uncontrolled, and needs to be on statin, will need rept lipids i 4 months also

## 2011-01-22 NOTE — Assessment & Plan Note (Signed)
Unintentional weight loss due to uncontrolled blood sugars

## 2011-01-22 NOTE — Progress Notes (Signed)
  Subjective:    Patient ID: Laura Atkins, female    DOB: [birth year], 71 y.o.   MRN: [id number]  HPI HYPERTENSION Disease Monitoring Blood pressure range-unknown Chest pain- no      Dyspnea- yes, however chronic, morbidly obese with severe arthritis Medications Compliance- good Lightheadedness- no   Edema- no   DIABETES Disease Monitoring Blood Sugar ranges-over 200 Polyuria- no New Visual problems- no Medications Compliance- poor in terms of diet  Hypoglycemic symptoms- no   HYPERLIPIDEMIA Disease Monitoring See symptoms for Hypertension Medications Compliance- good  RUQ pain- no  Muscle aches- no      Review of Systems Denies recent fever or chills. Denies sinus pressure, nasal congestion, ear pain or sore throat. Denies chest congestion, productive cough or wheezing. Denies chest pains, palpitations, paroxysmal nocturnal dyspnea, orthopnea and leg swelling Denies abdominal pain, nausea, vomiting,diarrhea or constipation.  Denies rectal bleeding or change in bowel movement. Denies dysuria, frequency, hesitancy , has chronic severe incontinence. Chronic joint pain, and stiffness which causes limitation in mobility. Denies headaches, seizure, numbness, or tingling. Denies uncontorlled  depression, anxiety or insomnia.Seeing therapy regularly Denies skin break down or rash.        Objective:   Physical Exam Patient alert and oriented and in no Cardiopulmonary distress.  HEENT: No facial asymmetry, EOMI, no sinus tenderness, TM's clear, Oropharynx pink and moist.  Neck supple no adenopathy.  Chest: Clear to auscultation bilaterally.  CVS: S1, S2 no murmurs, no S3.  ABD: Soft non tender. Bowel sounds normal.  Ext: No edema  MS: decreased  ROM spine, shoulders, hips and knees.  Skin: Intact, no ulcerations or rash noted.  Psych: Good eye contact, normal affect. Memory intact not anxious or depressed appearing.  CNS: CN 2-12 intact, power, tone and  sensation normal throughout. Diabetic Foot Check:  Appearance - no lesions, ulcers or calluses Skin - no unusual pallor or redness Sensation - grossly intact to light touch Monofilament testing -  Right - Great toe, medial, central, lateral ball and posterior foot decreased Left - Great toe, medial, central, lateral ball and posterior foot decreased Pulses Left - Dorsalis Pedis and Posterior Tibia normal Right - Dorsalis Pedis and Posterior Tibia normal        Assessment & Plan:

## 2011-01-23 ENCOUNTER — Other Ambulatory Visit: Payer: Self-pay | Admitting: *Deleted

## 2011-01-23 MED ORDER — LOVASTATIN 40 MG PO TABS: ORAL_TABLET | ORAL | Status: DC

## 2011-01-25 ENCOUNTER — Encounter (HOSPITAL_COMMUNITY): Payer: Medicare Other | Admitting: Psychiatry

## 2011-01-26 ENCOUNTER — Telehealth: Payer: Self-pay | Admitting: Family Medicine

## 2011-01-26 DIAGNOSIS — E785 Hyperlipidemia, unspecified: Secondary | ICD-10-CM

## 2011-01-26 MED ORDER — ATORVASTATIN CALCIUM 40 MG PO TABS: 40.0000 mg | ORAL_TABLET | Freq: Every day | ORAL | Status: DC

## 2011-01-26 NOTE — Telephone Encounter (Signed)
Med change based on labs

## 2011-02-05 ENCOUNTER — Other Ambulatory Visit: Payer: Self-pay | Admitting: Family Medicine

## 2011-02-06 NOTE — Procedures (Signed)
NAME:  Laura Atkins, Laura Atkins               ACCOUNT NO.:  [id number]   MEDICAL RECORD NO.:  [id number]          PATIENT TYPE:  OUT   LOCATION:  [facility]                          FACILITY:  [facility]   PHYSICIAN:  Kofi A. Merlene Laughter, M.D. DATE OF BIRTH:  1940-09-27   DATE OF PROCEDURE:  02/23/2009  DATE OF DISCHARGE:  02/23/2009                             SLEEP DISORDER REPORT   REFERRING PHYSICIAN:  Kofi A. Merlene Laughter, MD   PRIMARY CARE PHYSICIAN:  [place] Moshe Cipro, MD   REASON FOR STUDY:  This is a 70 year old lady who presents with snoring,  hypersomnia, and has been evaluated for obstructive sleep apnea  syndrome.   BMI 50.   EPWORTH SLEEPINESS SCALE:  21.   ARCHITECTURAL SUMMARY:  This is a split-night recording with the first  portion being a diagnostic and the second a titration recording.  The  total recording time in the diagnostics is 170, titration 212.  Sleep  latency 20 minutes.  REM latency 20 minutes.  Sleep efficiency is 70% in  the diagnostic and 67% in the titration.   RESPIRATORY SUMMARY:  Baseline oxygen saturation 98.  The lowest  saturation is 84.  The diagnostic AHI is 31.5.  The patient was titrated  between pressures of 5 and 15 with the optimal pressure being 15  resulted in resolution of events with no significant central apneas.   LIMB MOVEMENT SUMMARY:  PLM index 0.   ELECTROCARDIOGRAM SUMMARY:  Average heart rate 84 with no significant  dysrhythmias observed.   IMPRESSION:  1. Moderate obstructive sleep apnea syndrome, which responds well to a      continuous positive airway pressure of      15.  2. Early rapid eye movement latency.  Given the clinical significance      of the situation, this is most likely due to rapid eye movement      rebound phenomenon although narcolepsy may be a possibility.      Kofi A. Merlene Laughter, M.D.  Electronically Signed     KAD/MEDQ  D:  02/24/2009  T:  02/25/2009  Job:  [id number]   cc:   [place] Levo. Moshe Cipro, M.D.  Fax:  [phone]

## 2011-02-09 NOTE — H&P (Signed)
NAME:  Laura Atkins, Laura Atkins                           ACCOUNT NO.:  [id number]   MEDICAL RECORD NO.:  [id number]                   PATIENT TYPE:   LOCATION:                                       FACILITY:   PHYSICIAN:  Hildred Laser, M.D.                 DATE OF BIRTH:  [birth year]   DATE OF ADMISSION:  DATE OF DISCHARGE:                                HISTORY & PHYSICAL   CHIEF COMPLAINT:  Bowel urgency and incontinence.   HISTORY OF PRESENT ILLNESS:  This patient is a 70 year old African-American  female who presents to our office for evaluation of bowel urgency and  incontinence.  She was seen by Dr. [name] back in 1999 with probable  diarrhea predominant IBS.  However, she reports that she had resolutions in  her symptoms until approximately 6 weeks ago when she developed urgency and  loose watery bowel movements following each and every meal.  She feels  occasional abdominal cramping and pressure which is relieved by defecation.  However, she currently reports that she is not making it to the bathroom.  She does feel the sensation of defecation, however. She is able to pass  flatus.  She denies any recent travel or new pets.  She reports last  colonoscopy was approximately 8 or 9 years ago by Dr. Nadara Mustard, however, we do  not have this report.  She also reports that she is having problems with  urinary incontinence and has seen Dr. Maryland Pink recently and is scheduled  with him next week as well.  However, she does report that her urinary  incontinence has been going on for longer duration than her bowel  incontinence.   PAST MEDICAL HISTORY:  1. Diabetic neuropathy.  2. Diabetes.  3. Depression.  4. Right breast cyst.  5. Cholecystectomy in 1998.  6. Hysterectomy.   CURRENT MEDICATIONS:  1. DiaBeta 2 tablets b.i.d.  2. Alprazolam 0.25 mg b.i.d.  3. Zoloft 50 mg daily.  4. Lantus insulin 40 units b.i.d.  5. Lasix 40 mg daily.  6. Prandin 2 mg t.i.d. a.c.  7. Celebrex 100 mg  daily.  8. Lisinopril 20 mg daily.  9. Neurontin 1 tablet of unknown dose t.i.d.   ALLERGIES:  PENICILLIN (rash), DARVOCET (rash).   FAMILY HISTORY:  She reports that she had a maternal aunt with gastric  carcinoma; however, she denies any other liver or GI problems.  Mother is  deceased at age 77 with lung carcinoma.  Father is deceased at age 63 with  renal failure.  She has 1 sister and 1 brother both in good health.  She has  currently been married for 41 years. She has 5 healthy grandchildren.  She  is currently retired and on disability.  She denies any tobacco, alcohol, or  drug use.   REVIEW OF SYSTEMS:  CONSTITUTIONAL:  She denies any fever.  She reports  occasional chills.  She also reports occasional night sweats. GENITOURINARY:  She has had some hematuria as well as some urinary incontinence which is  being followed by Dr. Maryland Pink.  GASTROINTESTINAL:  See HPI.  SKIN:  She  denies any rash or jaundice.  CARDIOVASCULAR:  She denies any chest pain or  palpitations.  ENDOCRINE:  She does have a history of diabetes as well as  diabetic neuropathy and is currently on insulin.   PHYSICAL EXAMINATION:  VITAL SIGNS:  Weight 298 pounds.  Height 69 inches.  Temperature 97.7, blood pressure 130/76, pulse 78.  GENERAL:  This patient is a 70 year old African-American female.  She is  obese and ambulates with a cane.  She is alert and oriented in no acute  distress.  HEENT:  Sclerae are clear.  Nonicteric.  Conjunctivae pink. Oropharynx pink  and moist without any lesions.  NEUROLOGIC:  Cranial nerves II-XII intact.  Bilateral equal strength 5/5.  NECK:  Supple without any masses or thyromegaly.  HEART:  Regular rate and rhythm without any murmurs, clicks, rubs, or  gallops.  LUNGS:  Clear to auscultation bilaterally.  ABDOMEN:  Obese.  Positive bowel sounds x4.  Soft, nontender, nondistended.  No palpable organomegaly.  RECTAL:  There are 2 external hemorrhoids less than 1 cm  apiece at 6 and 8  o'clock.  They do not appear to be thrombosed.  Rectal sphincter with poor  tone. A small amount of brown stool was obtained which was Hemoccult-  positive.  EXTREMITIES:  2+ pedal pulses bilaterally.  No pedal edema.  SKIN:  Brown, warm and dry, without any rash or jaundice.   ASSESSMENT:  This patient is a 70 year old African-American female with  known diabetic neuropathy who presents with bowel incontinence as well as  Hemoccult-positive stools.  At this point in time given that her colonoscopy  was 8 or 9 years ago I felt that it would be beneficial to proceed with  another colonoscopy to rule out any masses or lesions.  If colonoscopy is  normal, would proceed with anorectal manometry at that point in time.  Also  will obtain stool specimens for ova and parasites, culture and sensitivity,  and wbc's today as well as obtain some laboratory studies.   RECOMMENDATIONS:  1. Will request labs from Dr. Lyman Speller office.  2. Colonoscopy to be scheduled at [facility] with Dr. [name].  3. She is instructed to decrease her Lantus to 30 units b.i.d.  4. She is also instructed to take half of her DiaBeta as well as her Prandin     dose the day prior to and of the procedure.  5. Stools to be obtained for ova and parasites, culture and sensitivity, and     wbc's.  6.     Labs today to include:  CBC, TSH, and MET-7.  7. Will follow up with Dr. [name] pending colonoscopy for further     recommendations.   We would like to thank Dr. Nadara Mustard for this kind referral.     _____________________________________  ___________________________________________  Les Pou, N.P.               Hildred Laser, M.D.   KC/MEDQ  D:  07/14/2003  T:  07/14/2003  Job:  [id number]   cc:   Rory Percy  [place], [place]  [place] [address]  Fax: [phone]

## 2011-02-09 NOTE — Consult Note (Signed)
NAME:  Laura Atkins, Laura Atkins               ACCOUNT NO.:  [id number]   MEDICAL RECORD NO.:  [id number]          PATIENT TYPE:  OUT   LOCATION:  RAD                           FACILITY:  [facility]   PHYSICIAN:  Hildred Laser, M.D.    DATE OF BIRTH:  [birth year]   DATE OF CONSULTATION:  DATE OF DISCHARGE:                                   CONSULTATION   REASON FOR CONSULTATION:  1.  Anemia, question need for colonoscopy.  2.  Hepatomegaly.  3.  The patient also complains of painful swelling overlying the left rib      cage, below her breasts.   HISTORY OF PRESENT ILLNESS:  Laura Atkins is a 70 year old, African-American  female who is referred, through courtesy of Dr. Moshe Cipro, for GI evaluation.  She recently had lab studies by Dr. Moshe Cipro, and was noted to be anemic.  Her hemoglobin was 10.3 grams, hematocrit was 34 and MCV was 68.4.  The  patient apparently told that she had never had a colonoscopy (according to  Dr. Griffin Dakin note).  Dr. Moshe Cipro felt that she needs to at least have a  colonoscopy since she never had one.  The patient did have colonoscopy by  me, in November 2004, because of nonbloody diarrhea, urgency and seepage.  This was a normal exam except external hemorrhoids.  Random biopsies of  sigmoid colon were negative for microscopic colitis.  She also had negative  stool studies.   She denies melena or rectal bleeding.  She recalls that, a few months back,  she did have an episode of two of hematochezia.  Recently, she was evaluated  by Dr. Maryland Pink for what she thought was hematuria, but workup was negative.  She denies abdominal pain, nausea or vomiting.  She has heartburn two to  three times a week.  She denies dysphagia.  She has a very good appetite.  She has gained 25 pounds in last 1 year.  She has been having left jaw pain  and recently was felt to have TMJ disease.  The patient states that she  recently had mammography.  At that time she noted there was painful swelling  just  below it.  This has gradually gotten worse.  There is no history of  injury to this area.  There is no history of elevated transaminases or prior  history of hepatitis.  She had normal LFTs on Feb 14, 2006.  The patient has  history of hepatomegaly which was initially picked up in November 1999.  She  had an ultrasound which measured her liver to be 18 cm, but there were no  focal abnormalities.   Recent evaluation includes abdominopelvic CT, on March 14, 2006, which was  negative other than the finding of hepatomegaly.  Liver measured 20 cm.  Spleen was normal.   She also had an ultrasound, on March 20, 2006, which was unremarkable except  mild hepatomegaly.   She is on:  1.  Prevacid 30 mg q.a.m.  2.  Alprazolam o.25 mg b.i.d. and 0.5 mg at bedtime.  3.  Lasix 40 mg in a.m. and 20  mg at noon.  4.  Neurontin 3 mg t.i.d.  5.  Vytorin 10/20 q.d.  6.  ASA 81 mg q.d.  7.  Ditropan XL 10 mg q.d.  8.  Tramadol 100 mg b.i.d.  9.  Actos 45 mg q.d.  10. Klor-Con 20 mEq t.i.d.  11. Avapro 150 mg q.d.  12. Albuterol inhaler 2 puffs q.i.d. p.r.n.  13. Humalog 75/25, 15 units b.i.d.  14. Lantus 60 units b.i.d.   PAST MEDICAL HISTORY:  She has:  1.  Hypertension.  2.  Diabetes mellitus.  3.  History of CHF.  4.  Depression.  5.  Obesity.  6.  Osteoarthrosis.  7.  Peripheral neuropathy.  8.  Left TMJ joint disease.  9.  History of IBS.  Her last colonoscopy was in November 2004, as above.  10. She had tubal ligation several years ago.  11. Appendectomy.  12. Hysterectomy.  13. She had recent left breast biopsy.  62. She had cholecystectomy in 1998.   ALLERGIES:  1.  PENICILLIN.  2.  PREDNISONE.  3.  DARVOCET-N 100.   FAMILY HISTORY:  Mother died of lung carcinoma in her 50s, and father died  of renal problems in his 54s.  She has a sister and a brother.  Brother has  problems with large keloids.   SOCIAL HISTORY:  She is married.  She has five children.  She is disabled.  She  does not smoke cigarettes or drink alcohol.   PHYSICAL EXAMINATION:  Pleasant obese African-American female who is in no  acute distress.  She weighs 314 pounds.  She is 5 feet 10 inches tall.  Pulse 84 per minute, blood pressure 164/60, temperature is 98.3.  Conjunctiva is pink.  Sclera is nonicteric.  Oral pharyngeal mucosa is  normal.  She has upper dental plate and few teeth in lower jaw.  No neck  masses or thyromegaly noted.  Cardiac exam with regular rhythm.  Normal S1-  S2.  No murmur or gallop noted.  Lungs are clear to auscultation.  Examination of her lower anterior chest wall reveals some asymmetry with  bulge on the left side, overlying the costal margin.  Skin is somewhat  glistening in this area.  It is soft and mildly tender.  This is quite  large, about 5 x 7 inches.  Her abdomen is obese, but soft and nontender.  Liver edge is indistinct, but margin appears to be 4 cm below RCM.  Spleen  is not palpable.  Rectal examination reveals guaiac-negative stool.  No  peripheral edema or clubbing noted.   Labs from Feb 14, 2006:  WBC 5.7, H&H 10.3 and 34.0, MCV 68.4, platelet  count 198,000.  Bili 0.3, AP 83, AST 16, ALT 15, total protein 7.4 with  albumin of 4.0.  Her hemoglobin A1c was 9.6 and it was 11 back in February  2007.   ASSESSMENT:  Laura Atkins is a 70 year old female with following gastrointestinal  problems:  1.  Mild hepatomegaly.  I suspect she has fatty liver.  Since her      transaminases are normal and there is no focal abnormality, I do not      feel that this needs to be further evaluated.  Her liver size was 18 cm      back in November 1999, and now is 30, so there really is not much      change.  2.  Anemia with low MCV.  Her stool is guaiac negative.  She did have      colonoscopy in November 2004, as above.  If she is documented to have     iron-deficiency anemia, I agree she needs to have EGD and a colonoscopy.      However, if this is not an  iron-deficiency anemia, she may not need to      have her GI tract further evaluated.  3.  She has a mildly tender, large, localized, subcutaneous mass of her left      rib cage, possibly a lipoma.  I have reviewed the CT and if this area      has not been looked at, I would suggest doing as a chest CT to confirm      the diagnosis.  Follow symptoms.  She is most concerned about this one.   RECOMMENDATIONS:  1.  Will review her abdominal CT in reference to this subcutaneous mass.  2.  She will have CBC, repeat LFTs, iron, TIBC, ferritin, B12 and folate      levels.   Further recommendations will be made after above reviewed.   We would like to thank Dr. Moshe Cipro for the opportunity to participate in  care of this nice lady's.      Hildred Laser, M.D.  Electronically Signed     NR/MEDQ  D:  04/04/2006  T:  04/04/2006  Job:  [id number]   cc:   Norwood Levo. Moshe Cipro, M.D.  Fax: [phone]

## 2011-02-09 NOTE — Op Note (Signed)
NAME:  Laura Atkins, Laura Atkins                         ACCOUNT NO.:  [id number]   MEDICAL RECORD NO.:  [id number]                   PATIENT TYPE:  AMB   LOCATION:  DAY                                  FACILITY:  [facility]   PHYSICIAN:  Hildred Laser, M.D.                 DATE OF BIRTH:  May 05, 1941   DATE OF PROCEDURE:  DATE OF DISCHARGE:                                 OPERATIVE REPORT   PROCEDURE:  Total colonoscopy.   ENDOSCOPIST:  Hildred Laser, M.D.   INDICATIONS:  Lennix is a 70 year old African-American female with multiple  medical problems including diabetes mellitus who presents with a several  week history of nonbloody diarrhea and bowel urgency; and she has had some  accidents.  After we saw her in the office we did stool studies which were  all negative.  Her TSH was normal. Her CBC was also normal except for very  mild anemia with H&H of 11.3 and 35.9.  Her MC is 65.9.  She is undergoing  diagnostic colonoscopy.   The procedure and risks were reviewed with the patient and informed consent  was obtained.   PREOPERATIVE MEDICATIONS:  Demerol 50 mg IV and Versed 6 mg IV.   FINDINGS:  Procedure performed in endoscopy suite.  The patient's vital  signs and O2 saturation were monitored during the procedure and remained  stable.  The patient was placed in the left lateral recumbent position and  rectal examination was performed.  No abnormality noted on external or  digital exam.  Rectal tone was noted to be normal.   Olympus videoscope was placed in the rectum and advanced under vision into  the sigmoid colon.  She had thick liquid stool scattered in the lower  segments of her colon.  Therefore, preparation was felt to be fair.  The  scope was passed to the cecum which was identified by ileocecal valve and  appendiceal orifice.  Pictures were taken for the record. As the scope was  withdrawn the colonic mucosa was, once again, carefully examined and  revealed normal vascular  pattern throughout.  No diverticula or other  abnormalities were noted.  A biopsy was taken from the sigmoid colon looking  for microscopic colitis.  Rectal mucosa was normal.  The scope was  retroflexed to examine the anorectal junction and small hemorrhoids were  noted below the dentate line.   Endoscope was straightened and withdrawn.  The patient tolerated the  procedure well.   FINAL DIAGNOSES:  1. Normal colonoscopy except small external hemorrhoids.  2. Random biopsy taken from sigmoid colon looking for     microscopic/collagenous colitis.  3. Suspect her symptoms are due to irritable bowel syndrome.  One of her     medications, i.e., Zoloft may also be the culprit.   RECOMMENDATIONS:  While we are awaiting biopsy results we will start her on  sugar-free Citrucel 1 tablespoonful daily  and Levbid 1 tablet before  breakfast daily.  She will keep stool diary as to frequency and  consistency of the stools.  I will be contacting the patient with the biopsy  results.  Further changes in therapy will be made at that time. We will plan  to see her back in the office in 1 month from now.  We will look at old  records to see if she has chronically decreased MCV, otherwise may consider  iron studies.      ___________________________________________                                            Hildred Laser, M.D.   NR/MEDQ  D:  07/27/2003  T:  07/27/2003  Job:  [id number]   cc:   Rory Percy  [place], [place] [address]  Fax: [phone]

## 2011-02-13 ENCOUNTER — Telehealth: Payer: Self-pay | Admitting: Family Medicine

## 2011-02-14 NOTE — Telephone Encounter (Signed)
pls call and find out if she has urinary symptoms and if so needs ccua only, ilet me know if anything else is going onm

## 2011-02-14 NOTE — Telephone Encounter (Signed)
Called patient, left message.

## 2011-02-15 NOTE — Telephone Encounter (Signed)
Patient aware and states that she also has been passing blood in her stool

## 2011-02-15 NOTE — Telephone Encounter (Signed)
Patient states no urinary symptoms, pain in waist on left side, x 1 week

## 2011-02-15 NOTE — Telephone Encounter (Signed)
States kidney doctor will not return calls

## 2011-02-15 NOTE — Telephone Encounter (Signed)
Phone busy #2

## 2011-02-15 NOTE — Telephone Encounter (Signed)
Called back and no answer.

## 2011-02-15 NOTE — Telephone Encounter (Signed)
Called, no answer again.

## 2011-02-15 NOTE — Telephone Encounter (Signed)
pls explain to pt since she is hurting a lot and I have no available openings, she should go to the Ed for evaluation of this pain, it is important she do this.

## 2011-02-15 NOTE — Telephone Encounter (Signed)
Will go to urgent care

## 2011-02-20 ENCOUNTER — Encounter: Payer: Self-pay | Admitting: Family Medicine

## 2011-02-20 ENCOUNTER — Other Ambulatory Visit: Payer: Self-pay | Admitting: Family Medicine

## 2011-02-21 ENCOUNTER — Encounter: Payer: Self-pay | Admitting: Family Medicine

## 2011-02-21 ENCOUNTER — Ambulatory Visit (INDEPENDENT_AMBULATORY_CARE_PROVIDER_SITE_OTHER): Payer: Medicare Other | Admitting: Family Medicine

## 2011-02-21 VITALS — BP 148/70 | HR 83 | Resp 16 | Ht 67.0 in | Wt 275.0 lb

## 2011-02-21 DIAGNOSIS — I10 Essential (primary) hypertension: Secondary | ICD-10-CM

## 2011-02-21 DIAGNOSIS — M1990 Unspecified osteoarthritis, unspecified site: Secondary | ICD-10-CM

## 2011-02-21 DIAGNOSIS — E669 Obesity, unspecified: Secondary | ICD-10-CM

## 2011-02-21 DIAGNOSIS — E785 Hyperlipidemia, unspecified: Secondary | ICD-10-CM

## 2011-02-21 DIAGNOSIS — E109 Type 1 diabetes mellitus without complications: Secondary | ICD-10-CM

## 2011-02-21 MED ORDER — NAPROXEN-ESOMEPRAZOLE 375-20 MG PO TBEC: 1.0000 | DELAYED_RELEASE_TABLET | Freq: Two times a day (BID) | ORAL | Status: DC

## 2011-02-21 MED ORDER — KETOROLAC TROMETHAMINE 30 MG/ML IJ SOLN
60.0000 mg | Freq: Once | INTRAMUSCULAR | Status: AC
  Administered 2011-02-21: 60 mg via INTRAMUSCULAR

## 2011-02-21 NOTE — Progress Notes (Signed)
  Subjective:    Patient ID: Laura Atkins, female    DOB: [birth year], 70 y.o.   MRN: [id number]  HPI 2 weeks ago pt had left hip pain radiating to groin, seen in urgent care on 5/25 and was prescribed cipro, got a call from urgent care today that she has no UTI, reports pain and instability of left hip, does not want to see ortho at this time wants toradol in the office. She has decided to return to her previous endocrinologist as they "seem to understand each other " better, she promises to take her diabetes more seriously interms of compliance with diet and med   Review of Systems Denies recent fever or chills. Denies sinus pressure, nasal congestion, ear pain or sore throat. Denies chest congestion, productive cough or wheezing. Denies chest pains, palpitations, paroxysmal nocturnal dyspnea, orthopnea and leg swelling Denies abdominal pain, nausea, vomiting,diarrhea or constipation.  Denies rectal bleeding or change in bowel movement. Denies dysuria, frequency, hesitancy or incontinence.  Denies headaches, seizure, numbness, or tingling. Denies uncontrolled depression, anxiety or insomnia.Benefiting from therapy Denies skin break down or rash.        Objective:   Physical Exam Patient alert and oriented and in no Cardiopulmonary distress.  HEENT: No facial asymmetry, EOMI, no sinus tenderness, TM's clear, Oropharynx pink and moist.  Neck supple no adenopathy.  Chest: Clear to auscultation bilaterally.  CVS: S1, S2 no murmurs, no S3.  ABD: Soft non tender. Bowel sounds normal.  Ext: No edema  [id number] ROM spine, shoulders, hips and knees.  Skin: Intact, no ulcerations or rash noted.  Psych: Good eye contact, normal affect. Memory loss, mild not anxious or depressed appearing.  CNS: CN 2-12 intact,      Assessment & Plan:

## 2011-02-21 NOTE — Patient Instructions (Signed)
Your pain is diue to arthritis, you will get toradol in the office, and an anti-inflammatory tablet is sent to your pharmacy also  No other new medications .  F/u in 3 months

## 2011-03-04 NOTE — Assessment & Plan Note (Signed)
Uncontrolled, LDL above goal, low fat diet encouraged and discussed, labs past due

## 2011-03-04 NOTE — Assessment & Plan Note (Signed)
Unchanged, lifestyle change with regard to diet , encouraged

## 2011-03-04 NOTE — Assessment & Plan Note (Signed)
Controlled, no change in medication  

## 2011-03-04 NOTE — Assessment & Plan Note (Signed)
Uncontrolled and treated by endo

## 2011-03-04 NOTE — Assessment & Plan Note (Signed)
Continues to deteriorate, toradol administered and anti-inflammatory prescribed. Weight loss encouraged to s;low progress of the disease

## 2011-03-06 ENCOUNTER — Encounter (HOSPITAL_COMMUNITY): Payer: Medicare Other | Admitting: Psychiatry

## 2011-03-13 ENCOUNTER — Encounter (INDEPENDENT_AMBULATORY_CARE_PROVIDER_SITE_OTHER): Payer: Medicare Other | Admitting: Psychiatry

## 2011-03-13 DIAGNOSIS — F329 Major depressive disorder, single episode, unspecified: Secondary | ICD-10-CM

## 2011-03-20 ENCOUNTER — Encounter: Payer: Self-pay | Admitting: Family Medicine

## 2011-03-21 ENCOUNTER — Ambulatory Visit (INDEPENDENT_AMBULATORY_CARE_PROVIDER_SITE_OTHER): Payer: Medicare Other | Admitting: Family Medicine

## 2011-03-21 ENCOUNTER — Encounter: Payer: Self-pay | Admitting: Family Medicine

## 2011-03-21 VITALS — BP 130/70 | HR 87 | Resp 16 | Ht 68.0 in | Wt 265.0 lb

## 2011-03-21 DIAGNOSIS — N390 Urinary tract infection, site not specified: Secondary | ICD-10-CM

## 2011-03-21 DIAGNOSIS — M549 Dorsalgia, unspecified: Secondary | ICD-10-CM

## 2011-03-21 DIAGNOSIS — I10 Essential (primary) hypertension: Secondary | ICD-10-CM

## 2011-03-21 DIAGNOSIS — R1900 Intra-abdominal and pelvic swelling, mass and lump, unspecified site: Secondary | ICD-10-CM | POA: Insufficient documentation

## 2011-03-21 DIAGNOSIS — B369 Superficial mycosis, unspecified: Secondary | ICD-10-CM | POA: Insufficient documentation

## 2011-03-21 DIAGNOSIS — E109 Type 1 diabetes mellitus without complications: Secondary | ICD-10-CM

## 2011-03-21 LAB — POCT URINALYSIS DIPSTICK
Bilirubin, UA: NEGATIVE
Glucose, UA: 500
Ketones, UA: NEGATIVE
Leukocytes, UA: NEGATIVE
Nitrite, UA: NEGATIVE
Protein, UA: NEGATIVE
Spec Grav, UA: 1.01
Urobilinogen, UA: 0.2
pH, UA: 5.5

## 2011-03-21 LAB — BASIC METABOLIC PANEL
BUN: 14 mg/dL (ref 6–23)
CO2: 29 mEq/L (ref 19–32)
Calcium: 9.8 mg/dL (ref 8.4–10.5)
Chloride: 94 mEq/L — ABNORMAL LOW (ref 96–112)
Creat: 0.83 mg/dL (ref 0.50–1.10)
Glucose, Bld: 400 mg/dL — ABNORMAL HIGH (ref 70–99)
Potassium: 4.5 mEq/L (ref 3.5–5.3)
Sodium: 137 mEq/L (ref 135–145)

## 2011-03-21 LAB — GLUCOSE, POCT (MANUAL RESULT ENTRY): POC Glucose: 430

## 2011-03-21 MED ORDER — KETOROLAC TROMETHAMINE 30 MG/ML IJ SOLN
60.0000 mg | Freq: Once | INTRAMUSCULAR | Status: AC
  Administered 2011-03-21: 60 mg via INTRAMUSCULAR

## 2011-03-21 MED ORDER — CLOTRIMAZOLE-BETAMETHASONE 1-0.05 % EX CREA: TOPICAL_CREAM | CUTANEOUS | Status: DC

## 2011-03-21 MED ORDER — INSULIN ASPART 100 UNIT/ML ~~LOC~~ SOLN
7.0000 [IU] | Freq: Once | SUBCUTANEOUS | Status: AC
  Administered 2011-03-21: 7 [IU] via SUBCUTANEOUS

## 2011-03-21 NOTE — Patient Instructions (Addendum)
F/u in 2 months.  Injection for back pain and med sent in for fungal skin infection  You are referred for scans of your abdomen and pelvis as well as your back to find out why you are hurting so much.  Lab today pls

## 2011-03-21 NOTE — Progress Notes (Signed)
  Subjective:    Patient ID: Laura Atkins, female    DOB: 1941-05-29, 70 y.o.   MRN: [id number]  HPI 6 week h/o pelvic and right lower abdominaol pain, took cipro incompletely, had been given them in urgent. Also has low back pain radiating to right  Groin, she c/o right bilateral lower extremity weakness and numbness, greater on the right, she does also have urinary incontinence. Reports that blood sugars remain uncontrolled  Review of Systems Denies recent fever or chills. Denies sinus pressure, nasal congestion, ear pain or sore throat. Denies chest congestion, productive cough or wheezing. Denies chest pains, and leg swelling C/o right lower  abdominal pain denies , nausea, vomiting,diarrhea or constipation.  Denies rectal bleeding or change in bowel movement. C/o  frequency, and  incontinence. C/o worsening back pain and reduced mobility with lower extremity weakness and numbness, greater on the right, pain also radiates to right groin Denies headaches, or seizure,  Denies uncontrolled depression, anxiety or insomnia. C/o pruritic rash in skin folds       Objective:   Physical Exam Patient alert and oriented and in no Cardiopulmonary distress.Pt in pain  HEENT: No facial asymmetry, EOMI, no sinus tenderness, TM's clear, Oropharynx pink and moist.  Neck adequate though decreased ROM, no adenopathy.  Chest: Clear to auscultation bilaterally.  CVS: S1, S2 no murmurs, no S3.  ABD: Soft obese, RLQ tenderness with possible palpable mass, exam difficult due to morbid obesity, bowel sounds normal Ext: No edema  MS: decreased  ROM thoracolumbar  spine, shoulders, hips and knees.  Skin: Intact, fungal infection under breasts and lower abdominal folds Psych: Good eye contact, normal affect. Memory loss, mildly anxious and epressed appearing.  CNS: CN 2-12 intact, power, and sensation reduced in right lower extremity       Assessment & Plan:

## 2011-03-22 ENCOUNTER — Telehealth: Payer: Self-pay

## 2011-03-22 ENCOUNTER — Encounter (INDEPENDENT_AMBULATORY_CARE_PROVIDER_SITE_OTHER): Payer: Medicare Other | Admitting: Psychiatry

## 2011-03-22 DIAGNOSIS — F411 Generalized anxiety disorder: Secondary | ICD-10-CM

## 2011-03-22 DIAGNOSIS — F329 Major depressive disorder, single episode, unspecified: Secondary | ICD-10-CM

## 2011-03-22 DIAGNOSIS — F3289 Other specified depressive episodes: Secondary | ICD-10-CM

## 2011-03-22 NOTE — Telephone Encounter (Signed)
Patient aware.

## 2011-03-23 LAB — URINE CULTURE
Colony Count: NO GROWTH
Organism ID, Bacteria: NO GROWTH

## 2011-03-25 DIAGNOSIS — N390 Urinary tract infection, site not specified: Secondary | ICD-10-CM | POA: Insufficient documentation

## 2011-03-25 NOTE — Assessment & Plan Note (Signed)
Controlled, no change in medication  

## 2011-03-25 NOTE — Assessment & Plan Note (Signed)
Remains uncontrolled, currently followed by endocrine

## 2011-03-25 NOTE — Assessment & Plan Note (Signed)
Deteriorated, medication prescribed

## 2011-03-25 NOTE — Assessment & Plan Note (Signed)
Deteriorated, anti-inflammatory injection administered and pt referred for imaging studies, due to objective loss of power and sensation in right lower extremity

## 2011-03-25 NOTE — Assessment & Plan Note (Signed)
Based on symptoms, specimen tested in office then sent for c/s , will await result before further treatment

## 2011-03-25 NOTE — Assessment & Plan Note (Signed)
refer for pelvic ct scan to further evaluate

## 2011-03-25 NOTE — Assessment & Plan Note (Signed)
Refer for scan with contrast

## 2011-03-26 ENCOUNTER — Other Ambulatory Visit: Payer: Self-pay | Admitting: Family Medicine

## 2011-03-26 DIAGNOSIS — R1900 Intra-abdominal and pelvic swelling, mass and lump, unspecified site: Secondary | ICD-10-CM

## 2011-03-30 ENCOUNTER — Ambulatory Visit (HOSPITAL_COMMUNITY)
Admission: RE | Admit: 2011-03-30 | Discharge: 2011-03-30 | Disposition: A | Discharge status: Home / Self Care | Payer: Medicare Other | Source: Ambulatory Visit | Attending: Family Medicine | Admitting: Family Medicine

## 2011-03-30 ENCOUNTER — Ambulatory Visit (HOSPITAL_COMMUNITY): Admission: RE | Admit: 2011-03-30 | Payer: Medicare Other | Source: Ambulatory Visit

## 2011-03-30 DIAGNOSIS — M545 Low back pain, unspecified: Secondary | ICD-10-CM | POA: Insufficient documentation

## 2011-03-30 DIAGNOSIS — M5126 Other intervertebral disc displacement, lumbar region: Secondary | ICD-10-CM | POA: Insufficient documentation

## 2011-03-30 DIAGNOSIS — R1900 Intra-abdominal and pelvic swelling, mass and lump, unspecified site: Secondary | ICD-10-CM

## 2011-03-30 DIAGNOSIS — M79609 Pain in unspecified limb: Secondary | ICD-10-CM | POA: Insufficient documentation

## 2011-03-30 DIAGNOSIS — M129 Arthropathy, unspecified: Secondary | ICD-10-CM | POA: Insufficient documentation

## 2011-03-30 DIAGNOSIS — M549 Dorsalgia, unspecified: Secondary | ICD-10-CM

## 2011-03-30 MED ORDER — IOHEXOL 300 MG/ML  SOLN
100.0000 mL | Freq: Once | INTRAMUSCULAR | Status: AC | PRN
  Administered 2011-03-30: 100 mL via INTRAVENOUS

## 2011-04-02 NOTE — Progress Notes (Signed)
aware

## 2011-04-07 ENCOUNTER — Other Ambulatory Visit: Payer: Self-pay | Admitting: Family Medicine

## 2011-04-13 ENCOUNTER — Encounter (INDEPENDENT_AMBULATORY_CARE_PROVIDER_SITE_OTHER): Payer: Medicare Other | Admitting: Psychiatry

## 2011-04-13 DIAGNOSIS — F411 Generalized anxiety disorder: Secondary | ICD-10-CM

## 2011-04-13 DIAGNOSIS — F329 Major depressive disorder, single episode, unspecified: Secondary | ICD-10-CM

## 2011-05-04 ENCOUNTER — Encounter (HOSPITAL_COMMUNITY): Payer: Medicare Other | Admitting: Psychiatry

## 2011-05-08 ENCOUNTER — Encounter (INDEPENDENT_AMBULATORY_CARE_PROVIDER_SITE_OTHER): Payer: Medicare Other | Admitting: Psychiatry

## 2011-05-08 DIAGNOSIS — F329 Major depressive disorder, single episode, unspecified: Secondary | ICD-10-CM

## 2011-05-24 ENCOUNTER — Ambulatory Visit: Payer: Medicare Other | Admitting: Family Medicine

## 2011-06-01 ENCOUNTER — Encounter: Payer: Self-pay | Admitting: Family Medicine

## 2011-06-04 ENCOUNTER — Ambulatory Visit: Payer: Medicare Other | Admitting: Family Medicine

## 2011-06-05 ENCOUNTER — Other Ambulatory Visit: Payer: Self-pay | Admitting: Family Medicine

## 2011-06-06 ENCOUNTER — Encounter (INDEPENDENT_AMBULATORY_CARE_PROVIDER_SITE_OTHER): Payer: Medicare Other | Admitting: Psychiatry

## 2011-06-06 ENCOUNTER — Encounter: Payer: Self-pay | Admitting: Family Medicine

## 2011-06-06 DIAGNOSIS — F329 Major depressive disorder, single episode, unspecified: Secondary | ICD-10-CM

## 2011-06-06 DIAGNOSIS — F411 Generalized anxiety disorder: Secondary | ICD-10-CM

## 2011-06-07 ENCOUNTER — Ambulatory Visit (INDEPENDENT_AMBULATORY_CARE_PROVIDER_SITE_OTHER): Payer: Medicare Other | Admitting: Family Medicine

## 2011-06-07 ENCOUNTER — Encounter: Payer: Self-pay | Admitting: Family Medicine

## 2011-06-07 VITALS — BP 116/60 | HR 78 | Resp 16 | Ht 67.5 in | Wt 272.1 lb

## 2011-06-07 DIAGNOSIS — Z23 Encounter for immunization: Secondary | ICD-10-CM

## 2011-06-07 DIAGNOSIS — E785 Hyperlipidemia, unspecified: Secondary | ICD-10-CM

## 2011-06-07 DIAGNOSIS — I10 Essential (primary) hypertension: Secondary | ICD-10-CM

## 2011-06-07 DIAGNOSIS — E119 Type 2 diabetes mellitus without complications: Secondary | ICD-10-CM

## 2011-06-07 DIAGNOSIS — M25519 Pain in unspecified shoulder: Secondary | ICD-10-CM

## 2011-06-07 DIAGNOSIS — M25511 Pain in right shoulder: Secondary | ICD-10-CM

## 2011-06-07 DIAGNOSIS — IMO0001 Reserved for inherently not codable concepts without codable children: Secondary | ICD-10-CM

## 2011-06-07 DIAGNOSIS — E109 Type 1 diabetes mellitus without complications: Secondary | ICD-10-CM

## 2011-06-07 DIAGNOSIS — E669 Obesity, unspecified: Secondary | ICD-10-CM

## 2011-06-07 LAB — GLUCOSE, POCT (MANUAL RESULT ENTRY): POC Glucose: 427

## 2011-06-07 MED ORDER — GABAPENTIN 300 MG PO CAPS: 300.0000 mg | ORAL_CAPSULE | Freq: Three times a day (TID) | ORAL | Status: DC

## 2011-06-07 MED ORDER — HYDROXYZINE HCL 25 MG PO TABS: 25.0000 mg | ORAL_TABLET | Freq: Every evening | ORAL | Status: DC | PRN

## 2011-06-07 MED ORDER — FUROSEMIDE 40 MG PO TABS: 40.0000 mg | ORAL_TABLET | Freq: Two times a day (BID) | ORAL | Status: DC

## 2011-06-07 MED ORDER — KETOROLAC TROMETHAMINE 60 MG/2ML IM SOLN
60.0000 mg | Freq: Once | INTRAMUSCULAR | Status: AC
  Administered 2011-06-07: 60 mg via INTRAMUSCULAR

## 2011-06-07 MED ORDER — AMLODIPINE BESYLATE 10 MG PO TABS: 10.0000 mg | ORAL_TABLET | Freq: Every day | ORAL | Status: DC

## 2011-06-07 MED ORDER — INFLUENZA VAC TYPES A & B PF IM SUSP: 0.5000 mL | Freq: Once | INTRAMUSCULAR | Status: DC

## 2011-06-07 NOTE — Patient Instructions (Addendum)
F/u in 2 months  Injection for right shoulder pain and referral to therapy at [facility], if this does not work you will need to see orthopedics.  Flu vaccine today.   Pls schedule appt with Dr Dorris Fetch

## 2011-06-24 NOTE — Assessment & Plan Note (Signed)
Uncontrolled, pt still reportedly eating and drinking sugary foods. Has not kept most recent appt with endo., not testing as she should, advised her of the importance to re establish with endo

## 2011-06-24 NOTE — Assessment & Plan Note (Signed)
Deteriorated. Patient re-educated about  the importance of commitment to a  minimum of 150 minutes of exercise per week. The importance of healthy food choices with portion control discussed. Encouraged to start a food diary, count calories and to consider  joining a support group. Sample diet sheets offered. Goals set by the patient for the next several months.    

## 2011-06-24 NOTE — Progress Notes (Signed)
  Subjective:    Patient ID: Laura Atkins, female    DOB: Oct 18, 1940, 70 y.o.   MRN: [id number]  HPI The PT is here for follow up and re-evaluation of chronic medical conditions, medication management and review of any available recent lab and radiology data.  Preventive health is updated, specifically  Cancer screening and Immunization.   Questions or concerns regarding consultations or procedures which the PT has had in the interim are  addressed. The PT denies any adverse reactions to current medications since the last visit.  There are no new concerns.  C/o increased right shoulder pain with reduced mobility, no recent trauma , an old problem which is worsening. Blood sugars reportedly remain over 200 whenever checked, which is not as frequently as desired, also her eating remains uncontrolled     Review of Systems See HPI Denies recent fever or chills. Denies sinus pressure, nasal congestion, ear pain or sore throat. Denies chest congestion, productive cough or wheezing. Denies chest pains, palpitations and leg swelling Denies abdominal pain, nausea, vomiting,diarrhea or constipation.   Denies dysuria, frequency, hesitancy or incontinence. Denies headaches, seizures, numbness, or tingling. Denies uncontrolled  depression, anxiety or insomnia.Sees therapist regularly and states this is helping a lot. Denies skin break down or rash.        Objective:   Physical Exam  Patient alert and oriented and in no cardiopulmonary distress.  HEENT: No facial asymmetry, EOMI, no sinus tenderness,  oropharynx pink and moist.  Neck decreased ROM  no adenopathy.no JVD  Chest: Clear to auscultation bilaterally.  CVS: S1, S2 no murmurs, no S3.  ABD: Soft non tender. Bowel sounds normal.  Ext: No edema  MS: decreased ROM spine, shoulders, hips and knees.  Skin: Intact, no ulcerations or rash noted.  Psych: Good eye contact, normal affect. Memory impaired not anxious or depressed  appearing.  CNS: CN 2-12 intact, power,  normal throughout.       Assessment & Plan:

## 2011-06-24 NOTE — Assessment & Plan Note (Signed)
Hyperlipidemia:Low fat diet discussed and encouraged.  Updated labs are past due

## 2011-06-24 NOTE — Assessment & Plan Note (Signed)
Controlled, no change in medication  

## 2011-06-24 NOTE — Assessment & Plan Note (Signed)
Deteriorated and uncontrolled with reduction in function, anti-inflammatory administered IM in office, pt referred for PT and will likely need to see ortho based on the severity of the disease

## 2011-07-04 ENCOUNTER — Encounter (INDEPENDENT_AMBULATORY_CARE_PROVIDER_SITE_OTHER): Payer: Medicare Other | Admitting: Psychiatry

## 2011-07-04 DIAGNOSIS — F329 Major depressive disorder, single episode, unspecified: Secondary | ICD-10-CM

## 2011-07-04 DIAGNOSIS — F411 Generalized anxiety disorder: Secondary | ICD-10-CM

## 2011-07-05 ENCOUNTER — Encounter (HOSPITAL_COMMUNITY): Payer: Medicare Other | Admitting: Psychiatry

## 2011-07-11 ENCOUNTER — Telehealth: Payer: Self-pay | Admitting: Family Medicine

## 2011-07-12 ENCOUNTER — Other Ambulatory Visit: Payer: Self-pay | Admitting: Family Medicine

## 2011-07-12 MED ORDER — GLUCOSE BLOOD VI STRP: ORAL_STRIP | Status: DC

## 2011-07-12 MED ORDER — ONETOUCH DELICA LANCETS MISC: Status: DC

## 2011-07-12 NOTE — Telephone Encounter (Signed)
Sent in

## 2011-07-22 ENCOUNTER — Other Ambulatory Visit: Payer: Self-pay | Admitting: Family Medicine

## 2011-07-24 ENCOUNTER — Encounter (INDEPENDENT_AMBULATORY_CARE_PROVIDER_SITE_OTHER): Payer: Medicare Other | Admitting: Psychiatry

## 2011-07-24 DIAGNOSIS — F3289 Other specified depressive episodes: Secondary | ICD-10-CM

## 2011-07-24 DIAGNOSIS — F329 Major depressive disorder, single episode, unspecified: Secondary | ICD-10-CM

## 2011-07-25 ENCOUNTER — Encounter (INDEPENDENT_AMBULATORY_CARE_PROVIDER_SITE_OTHER): Payer: Medicare Other | Admitting: Psychiatry

## 2011-07-25 ENCOUNTER — Other Ambulatory Visit: Payer: Self-pay

## 2011-07-25 DIAGNOSIS — F329 Major depressive disorder, single episode, unspecified: Secondary | ICD-10-CM

## 2011-07-25 DIAGNOSIS — F411 Generalized anxiety disorder: Secondary | ICD-10-CM

## 2011-07-25 MED ORDER — GLUCOSE BLOOD VI STRP: ORAL_STRIP | Status: DC

## 2011-07-25 MED ORDER — ALPRAZOLAM 0.5 MG PO TABS: ORAL_TABLET | ORAL | Status: DC

## 2011-07-30 ENCOUNTER — Other Ambulatory Visit: Payer: Self-pay

## 2011-07-30 MED ORDER — ONETOUCH ULTRASOFT LANCETS MISC: Status: DC

## 2011-07-31 ENCOUNTER — Telehealth: Payer: Self-pay | Admitting: Family Medicine

## 2011-07-31 NOTE — Telephone Encounter (Signed)
Needs rx for lancets needs rx  For the old lancets

## 2011-08-01 MED ORDER — ALPRAZOLAM 0.5 MG PO TABS: ORAL_TABLET | ORAL | Status: DC

## 2011-08-01 NOTE — Telephone Encounter (Signed)
We have been prescribing this for her. Put for Dr to sign

## 2011-08-06 ENCOUNTER — Encounter: Payer: Self-pay | Admitting: Family Medicine

## 2011-08-07 ENCOUNTER — Other Ambulatory Visit: Payer: Self-pay | Admitting: Family Medicine

## 2011-08-07 ENCOUNTER — Encounter: Payer: Self-pay | Admitting: Family Medicine

## 2011-08-07 ENCOUNTER — Other Ambulatory Visit: Payer: Self-pay

## 2011-08-07 ENCOUNTER — Ambulatory Visit (INDEPENDENT_AMBULATORY_CARE_PROVIDER_SITE_OTHER): Payer: Medicare Other | Admitting: Family Medicine

## 2011-08-07 VITALS — BP 124/74 | HR 77 | Resp 16 | Ht 67.5 in | Wt 275.0 lb

## 2011-08-07 DIAGNOSIS — Z139 Encounter for screening, unspecified: Secondary | ICD-10-CM

## 2011-08-07 DIAGNOSIS — M1990 Unspecified osteoarthritis, unspecified site: Secondary | ICD-10-CM

## 2011-08-07 DIAGNOSIS — E785 Hyperlipidemia, unspecified: Secondary | ICD-10-CM

## 2011-08-07 DIAGNOSIS — E109 Type 1 diabetes mellitus without complications: Secondary | ICD-10-CM

## 2011-08-07 DIAGNOSIS — F329 Major depressive disorder, single episode, unspecified: Secondary | ICD-10-CM

## 2011-08-07 DIAGNOSIS — E119 Type 2 diabetes mellitus without complications: Secondary | ICD-10-CM

## 2011-08-07 DIAGNOSIS — IMO0001 Reserved for inherently not codable concepts without codable children: Secondary | ICD-10-CM

## 2011-08-07 DIAGNOSIS — I10 Essential (primary) hypertension: Secondary | ICD-10-CM

## 2011-08-07 DIAGNOSIS — F3289 Other specified depressive episodes: Secondary | ICD-10-CM

## 2011-08-07 DIAGNOSIS — E669 Obesity, unspecified: Secondary | ICD-10-CM

## 2011-08-07 MED ORDER — BENAZEPRIL HCL 20 MG PO TABS: ORAL_TABLET | ORAL | Status: DC

## 2011-08-07 NOTE — Patient Instructions (Addendum)
CPE  end January.  Fasting lipid, cmp and EGFR in January  Immunization  Is up to date.  Mammogram is being scheduled for you  You are referred for diabetic eye exam with Dr Iona Hansen  i am happy you are doing better, keep it up!

## 2011-08-07 NOTE — Progress Notes (Signed)
  Subjective:    Patient ID: Laura Atkins, female    DOB: 03-Mar-1941, 70 y.o.   MRN: [id number]  HPI The PT is here for follow up and re-evaluation of chronic medical conditions, medication management and review of any available recent lab and radiology data.  Preventive health is updated, specifically  Cancer screening and Immunization.   Questions or concerns regarding consultations or procedures which the PT has had in the interim are  addressed. The PT denies any adverse reactions to current medications since the last visit.  There are no new concerns.  Blood sugars are being checked mopre regularly, she has a log which shows fasting sugars to be between 180 to 200     Review of Systems See HPI Denies recent fever or chills. Denies sinus pressure, nasal congestion, ear pain or sore throat. Denies chest congestion, productive cough or wheezing. Denies chest pains, palpitations and leg swelling Denies abdominal pain, nausea, vomiting,diarrhea or constipation.   Denies dysuria, frequency, hesitancy or incontinence. Chronic joint pain, and limitation in mobility. Denies headaches, seizures, numbness, or tingling. Denies uncontrolled  depression, anxiety or insomnia. Denies skin break down or rash.        Objective:   Physical Exam Patient alert and oriented and in no cardiopulmonary distress.  HEENT: No facial asymmetry, EOMI, no sinus tenderness,  oropharynx pink and moist.  Neck supple no adenopathy.  Chest: Clear to auscultation bilaterally.  CVS: S1, S2 no murmurs, no S3.  ABD: Soft non tender. Bowel sounds normal.  Ext: No edema  MS: decreased ROM spine, shoulders, hips and knees.  Skin: Intact, no ulcerations or rash noted.  Psych: Good eye contact, normal affect. Memory moderately impaired, not anxious or depressed appearing.  CNS: CN 2-12 intact, power, tone and sensation normal throughout.        Assessment & Plan:

## 2011-08-11 ENCOUNTER — Other Ambulatory Visit (HOSPITAL_COMMUNITY): Payer: Self-pay

## 2011-08-13 NOTE — Assessment & Plan Note (Signed)
Controlled, no change in medication  

## 2011-08-13 NOTE — Assessment & Plan Note (Signed)
Hyperlipidemia:Low fat diet discussed and encouraged.  Medication to continue as before, pt aware LDL is elevated and she needs to reduce fried and fatty foods and red meat

## 2011-08-13 NOTE — Assessment & Plan Note (Signed)
Uncontrolled, followed by endo 

## 2011-08-13 NOTE — Assessment & Plan Note (Signed)
Unchanged, continue meds

## 2011-08-13 NOTE — Assessment & Plan Note (Signed)
Deteriorated. Patient re-educated about  the importance of commitment to a  minimum of 150 minutes of exercise per week. The importance of healthy food choices with portion control discussed. Encouraged to start a food diary, count calories and to consider  joining a support group. Sample diet sheets offered. Goals set by the patient for the next several months.    

## 2011-08-13 NOTE — Assessment & Plan Note (Signed)
Improved since pt seeing therapist, continue meds

## 2011-08-14 ENCOUNTER — Ambulatory Visit (HOSPITAL_COMMUNITY)
Admission: RE | Admit: 2011-08-14 | Discharge: 2011-08-14 | Disposition: A | Discharge status: Home / Self Care | Payer: Medicare Other | Source: Ambulatory Visit | Attending: Family Medicine | Admitting: Family Medicine

## 2011-08-14 DIAGNOSIS — Z1231 Encounter for screening mammogram for malignant neoplasm of breast: Secondary | ICD-10-CM | POA: Insufficient documentation

## 2011-08-14 DIAGNOSIS — Z139 Encounter for screening, unspecified: Secondary | ICD-10-CM

## 2011-08-15 ENCOUNTER — Telehealth: Payer: Self-pay | Admitting: Family Medicine

## 2011-08-22 ENCOUNTER — Ambulatory Visit (INDEPENDENT_AMBULATORY_CARE_PROVIDER_SITE_OTHER): Payer: Medicare Other | Admitting: Psychiatry

## 2011-08-22 ENCOUNTER — Encounter (HOSPITAL_COMMUNITY): Payer: Self-pay | Admitting: Psychiatry

## 2011-08-22 DIAGNOSIS — F419 Anxiety disorder, unspecified: Secondary | ICD-10-CM

## 2011-08-22 DIAGNOSIS — F329 Major depressive disorder, single episode, unspecified: Secondary | ICD-10-CM

## 2011-08-22 DIAGNOSIS — F411 Generalized anxiety disorder: Secondary | ICD-10-CM

## 2011-08-22 DIAGNOSIS — F32A Depression, unspecified: Secondary | ICD-10-CM

## 2011-08-22 NOTE — Patient Instructions (Signed)
Discussed orally 

## 2011-08-22 NOTE — Progress Notes (Signed)
Patient:  Laura Atkins   DOB: 09-17-1941  MR Number: [id number]  Location: [facility]:  [address] [place],  [place], [address]  Start: Wednesday 08/22/2011  10:30 AM End: Wednesday 08/22/2011 11:30 PM  Provider/Observer:     Maurice Small, MSW, LCSW   Chief Complaint:      Chief Complaint  Patient presents with  . Depression  . Anxiety    Reason For Service:     The patient was referred for services by primary care physician Dr. Tula Nakayama do to patient experiencing symptoms of depression. The patient has a long-standing history of depression. She was referred to this practice for continuity of care as she was seen at [facility] but was having difficulty scheduling a time to see a therapist. Her stressors included marital issues, declining health, family issues, and financial problems.  Interventions Strategy:  Supportive therapy, cognitive behavioral therapy  Participation Level:   Active  Participation Quality:  Appropriate      Behavioral Observation:  Fairly Groomed, Alert, and Appropriate.   Current Psychosocial Factors: The patient has ongoing issues with her ex daughter-in-law and her grandchildren.  Content of Session:   Reviewing symptoms, identifying stressors, identifying areas within patient's control, reframing negative thoughts, identifying realistic expectations  Current Status:   The patient is experiencing improved mood, increased social involvement with family, but continued excessive worry.  Patient Progress:   Good. Patient is less depressed but continues to have excessive worry regarding her children and her grandchildren. She reports recently enjoying Thanksgiving with family also reports being stressed by her ex daughter-in-law's behavior and her great-grandchildren behavior. Patient admits having deep-seated negative feelings about her ex daughter-in-law due to past issues. Therapist worked with patient to identify realistic expectations  versus reasonable expectations regarding the relationship with her daughter-in-law.  Target Goals:   1. Improve ability to set, maintain, and respect boundaries. 2. Decrease anxiety and excessive worry.  Last Reviewed:   08/21/2010  Goals Addressed Today:    Improve ability to set, maintain, and respect boundaries, decrease anxiety and excessive worry  Impression/Diagnosis:   The patient has been suffering symptoms of depression intermittently for the past 6-7 years. She continues to experience anxiety, excessive worrying, and depressed mood. Diagnosis: Depressive disorder NOS, anxiety disorder NOS  Diagnosis:  Axis I:  1. Depression   2. Anxiety disorder             Axis II: Deferred

## 2011-08-22 NOTE — Telephone Encounter (Signed)
Spoke with patient and she had received a paper from medicare on a survey about [place] and she didn't appreciate it and wanted to let us know.

## 2011-09-19 ENCOUNTER — Encounter (HOSPITAL_COMMUNITY): Payer: Self-pay | Admitting: Psychiatry

## 2011-09-19 ENCOUNTER — Ambulatory Visit (INDEPENDENT_AMBULATORY_CARE_PROVIDER_SITE_OTHER): Payer: Medicare Other | Admitting: Psychiatry

## 2011-09-19 DIAGNOSIS — F329 Major depressive disorder, single episode, unspecified: Secondary | ICD-10-CM

## 2011-09-19 DIAGNOSIS — F3289 Other specified depressive episodes: Secondary | ICD-10-CM

## 2011-09-19 DIAGNOSIS — F411 Generalized anxiety disorder: Secondary | ICD-10-CM

## 2011-09-19 DIAGNOSIS — F419 Anxiety disorder, unspecified: Secondary | ICD-10-CM

## 2011-09-19 NOTE — Progress Notes (Signed)
Patient:  Laura Atkins   DOB: 25-Sep-1940  MR Number: [id number]  Location: [facility] [place]:  [address] [place],  [place], [address]  Start: Wednesday 09/19/2011  10:30 AM End: Wednesday 09/19/2011 11:30 PM  Provider/Observer:     Maurice Small, MSW, LCSW   Chief Complaint:      Chief Complaint  Patient presents with  . Depression  . Anxiety    Reason For Service:     The patient was referred for services by primary care physician Dr. Tula Nakayama do to patient experiencing symptoms of depression. The patient has a long-standing history of depression. She was referred to this practice for continuity of care as she was seen at [facility] but was having difficulty scheduling a time to see a therapist. Her stressors included marital issues, declining health, family issues, and financial problems.  Interventions Strategy:  Supportive therapy, cognitive behavioral therapy  Participation Level:   Active  Participation Quality:  Appropriate      Behavioral Observation:  Fairly Groomed, Alert, and Appropriate.   Current Psychosocial Factors: The patient reports increased sadness as many of her classmates have died recently. She also reports worry regarding her Medicare benefits  Content of Session:   Reviewing symptoms, identifying stressors, identifying areas within patient's control, reframing negative thoughts, identifying realistic expectations  Current Status:   The patient is experiencing improved mood, increased social involvement with family, but continued excessive worry.  Patient Progress:   Good. Patient is less depressed but continues to have excessive worry regarding her children and her grandchildren. She continues to express frustration regarding her daughter-in-law but reports trying to be more accepting. Patient also has had increased worry regarding her Medicare benefits. She expresses frustration regarding understanding the benefits. Therapist works with  patient to identifying ways to use her support system and possible resources. Target Goals:   1. Improve ability to set, maintain, and respect boundaries. 2. Decrease anxiety and excessive worry.  Last Reviewed:   08/21/2010  Goals Addressed Today:    Improve ability to set, maintain, and respect boundaries, decrease anxiety and excessive worry  Impression/Diagnosis:   The patient has been suffering symptoms of depression intermittently for the past 6-7 years. She continues to experience anxiety, excessive worrying, and depressed mood. Diagnosis: Depressive disorder NOS, anxiety disorder NOS  Diagnosis:  Axis I:  1. Depressive disorder   2. Anxiety disorder             Axis II: Deferred

## 2011-09-19 NOTE — Patient Instructions (Signed)
Discussed orally 

## 2011-09-22 LAB — LIPID PANEL
Cholesterol: 172 mg/dL (ref 0–200)
HDL: 66 mg/dL (ref >39)
LDL Cholesterol: 88 mg/dL (ref 0–99)
Total CHOL/HDL Ratio: 2.6 Ratio
Triglycerides: 88 mg/dL (ref <150)
VLDL: 18 mg/dL (ref 0–40)

## 2011-09-22 LAB — COMPLETE METABOLIC PANEL WITH GFR
ALT: 19 U/L (ref 0–35)
AST: 17 U/L (ref 0–37)
Albumin: 4.2 g/dL (ref 3.5–5.2)
Alkaline Phosphatase: 91 U/L (ref 39–117)
BUN: 21 mg/dL (ref 6–23)
CO2: 29 mEq/L (ref 19–32)
Calcium: 9.7 mg/dL (ref 8.4–10.5)
Chloride: 99 mEq/L (ref 96–112)
Creat: 0.82 mg/dL (ref 0.50–1.10)
GFR, Est African American: 85 mL/min/{1.73_m2}
GFR, Est Non African American: 74 mL/min/{1.73_m2}
Glucose, Bld: 148 mg/dL — ABNORMAL HIGH (ref 70–99)
Potassium: 4.1 mEq/L (ref 3.5–5.3)
Sodium: 140 mEq/L (ref 135–145)
Total Bilirubin: 0.3 mg/dL (ref 0.3–1.2)
Total Protein: 7.3 g/dL (ref 6.0–8.3)

## 2011-10-05 ENCOUNTER — Encounter (INDEPENDENT_AMBULATORY_CARE_PROVIDER_SITE_OTHER): Payer: Medicare Other | Admitting: Ophthalmology

## 2011-10-05 DIAGNOSIS — E1139 Type 2 diabetes mellitus with other diabetic ophthalmic complication: Secondary | ICD-10-CM

## 2011-10-05 DIAGNOSIS — H43819 Vitreous degeneration, unspecified eye: Secondary | ICD-10-CM

## 2011-10-05 DIAGNOSIS — H3581 Retinal edema: Secondary | ICD-10-CM

## 2011-10-05 DIAGNOSIS — E11319 Type 2 diabetes mellitus with unspecified diabetic retinopathy without macular edema: Secondary | ICD-10-CM

## 2011-10-05 DIAGNOSIS — E1165 Type 2 diabetes mellitus with hyperglycemia: Secondary | ICD-10-CM

## 2011-10-05 DIAGNOSIS — H2510 Age-related nuclear cataract, unspecified eye: Secondary | ICD-10-CM

## 2011-10-11 ENCOUNTER — Encounter: Payer: Self-pay | Admitting: Family Medicine

## 2011-10-11 ENCOUNTER — Ambulatory Visit (INDEPENDENT_AMBULATORY_CARE_PROVIDER_SITE_OTHER): Payer: Medicare Other | Admitting: Family Medicine

## 2011-10-11 VITALS — BP 140/74 | HR 91 | Resp 16 | Ht 67.5 in | Wt 285.0 lb

## 2011-10-11 DIAGNOSIS — J40 Bronchitis, not specified as acute or chronic: Secondary | ICD-10-CM | POA: Insufficient documentation

## 2011-10-11 DIAGNOSIS — E669 Obesity, unspecified: Secondary | ICD-10-CM

## 2011-10-11 DIAGNOSIS — M1990 Unspecified osteoarthritis, unspecified site: Secondary | ICD-10-CM

## 2011-10-11 DIAGNOSIS — F329 Major depressive disorder, single episode, unspecified: Secondary | ICD-10-CM

## 2011-10-11 DIAGNOSIS — I10 Essential (primary) hypertension: Secondary | ICD-10-CM

## 2011-10-11 DIAGNOSIS — E109 Type 1 diabetes mellitus without complications: Secondary | ICD-10-CM

## 2011-10-11 DIAGNOSIS — E785 Hyperlipidemia, unspecified: Secondary | ICD-10-CM

## 2011-10-11 DIAGNOSIS — J209 Acute bronchitis, unspecified: Secondary | ICD-10-CM

## 2011-10-11 MED ORDER — BENZONATATE 100 MG PO CAPS: 100.0000 mg | ORAL_CAPSULE | Freq: Four times a day (QID) | ORAL | Status: DC | PRN

## 2011-10-11 MED ORDER — AZITHROMYCIN 250 MG PO TABS: ORAL_TABLET | ORAL | Status: AC

## 2011-10-11 NOTE — Patient Instructions (Signed)
cPE in 4 months.  Please call if you need to see me before  You are being treated for bronchitis, medication is sent to your pharmacy.  Cholesterol, blood pressure, liver and kidney function are all excellent.  Please work on changing your eating so that your blood sugar gets controlled, and keep appointments with Dr Dorris Fetch  No medication changes of your chrionic meds

## 2011-10-11 NOTE — Assessment & Plan Note (Signed)
Controlled, no change in medication  

## 2011-10-11 NOTE — Assessment & Plan Note (Signed)
Followed by endo, uncontrolled, though reports improvement

## 2011-10-12 ENCOUNTER — Other Ambulatory Visit (INDEPENDENT_AMBULATORY_CARE_PROVIDER_SITE_OTHER): Payer: Medicare Other | Admitting: Ophthalmology

## 2011-10-14 NOTE — Assessment & Plan Note (Signed)
Deteriorated. Patient re-educated about  the importance of commitment to a  minimum of 150 minutes of exercise per week. The importance of healthy food choices with portion control discussed. Encouraged to start a food diary, count calories and to consider  joining a support group. Sample diet sheets offered. Goals set by the patient for the next several months.    

## 2011-10-14 NOTE — Assessment & Plan Note (Signed)
Worsening over time and with weight gain

## 2011-10-14 NOTE — Assessment & Plan Note (Signed)
Acute infection, antibiotic and decongestant prescribed

## 2011-10-14 NOTE — Assessment & Plan Note (Signed)
Improved, receiving psychotherapy , which has been very  Beneficial, continue meds as before

## 2011-10-14 NOTE — Progress Notes (Signed)
  Subjective:    Patient ID: Laura Atkins, female    DOB: [birth year], 71 y.o.   MRN: [id number]  HPI 1 week h/o progressive chest congestion and productive cough, with wheeze and shortness of breath. Positive sick exposure , states her spouse was also sick and needed antibiotics. She continues to receive psychotherapy which she and her spouse say is beneficial. Denies overt uncontrolled depression. Blood sugars are seldom under 200, she states she is working harder at dietary control, and continues to follow with endocrine She is also reviewing recent labs , which are excellent   Review of Systems See HPI Denies recent fever or chills. Denies sinus pressure, nasal congestion, ear pain or sore throat.  Denies chest pains, palpitations and leg swelling Denies abdominal pain, nausea, vomiting,diarrhea or constipation.   Denies dysuria, frequency, hesitancy or incontinence. Chronic back and joint pains are worsening, no recent falls Denies headaches, seizures, numbness, or tingling. Denies uncontrolled  depression, anxiety or insomnia. Denies skin break down or rash.        Objective:   Physical Exam Patient alert and oriented and in no cardiopulmonary distress.  HEENT: No facial asymmetry, EOMI, no sinus tenderness,  oropharynx pink and moist.  Neck decreased ROM no adenopathy.  Chest: decreased air entry , scattered crackles, no wheezes  CVS: S1, S2 no murmurs, no S3.  ABD: Soft non tender. Bowel sounds normal.  Ext: No edema  MS: decreased ROM spine, shoulders, hips and knees.  Skin: Intact, no ulcerations or rash noted.  Psych: Good eye contact, normal affect. Memory mildly impaired not anxious or depressed appearing.  CNS: CN 2-12 intact, power, tone and sensation normal throughout.        Assessment & Plan:

## 2011-10-16 ENCOUNTER — Encounter (HOSPITAL_COMMUNITY): Payer: Medicare Other | Admitting: Psychiatry

## 2011-10-16 ENCOUNTER — Ambulatory Visit (HOSPITAL_COMMUNITY): Payer: Medicare Other | Admitting: Psychiatry

## 2011-10-17 ENCOUNTER — Other Ambulatory Visit (INDEPENDENT_AMBULATORY_CARE_PROVIDER_SITE_OTHER): Payer: Medicare Other | Admitting: Ophthalmology

## 2011-10-17 DIAGNOSIS — H3581 Retinal edema: Secondary | ICD-10-CM

## 2011-10-23 ENCOUNTER — Ambulatory Visit: Payer: Medicare Other | Admitting: Family Medicine

## 2011-10-24 ENCOUNTER — Encounter: Payer: Medicare Other | Admitting: Family Medicine

## 2011-10-25 ENCOUNTER — Ambulatory Visit (INDEPENDENT_AMBULATORY_CARE_PROVIDER_SITE_OTHER): Payer: Medicare Other | Admitting: Psychiatry

## 2011-10-25 ENCOUNTER — Encounter (HOSPITAL_COMMUNITY): Payer: Self-pay | Admitting: Psychiatry

## 2011-10-25 VITALS — Wt 279.0 lb

## 2011-10-25 DIAGNOSIS — F32A Depression, unspecified: Secondary | ICD-10-CM

## 2011-10-25 DIAGNOSIS — F329 Major depressive disorder, single episode, unspecified: Secondary | ICD-10-CM

## 2011-10-25 MED ORDER — SERTRALINE HCL 50 MG PO TABS: 50.0000 mg | ORAL_TABLET | Freq: Every day | ORAL | Status: DC

## 2011-10-25 NOTE — Progress Notes (Signed)
Patient came for her followup appointment. She's been compliant with her medications Zoloft 150 mg. She reported no side effects of medication. She is sleeping better. She denies any agitation anger or recent crying spells. She continues to have issues with her family member and church people however they are less intense and less frequent. She continued to admit to getting easily sensitive when things are not doing very well. Overall her depression has been stable. She is seeing therapist on a regular basis. She denies any agitation anger or insomnia.  Mental status examination Patient is morbid obese female who is casually dressed and fairly groomed. Her thought processes very slow but logical linear goal-directed. She maintains fair eye contact. Her thought content does not have any paranoia or delusions. She denies any auditory or visual hallucination. She continues to have poor attention and concentration and does not remember things very well however she is well relevant in conversation. She denies any active or passive suicidal thoughts or homicidal thoughts. She's alert and oriented x3. Her insight judgment and pulse control is okay.  Diagnosis Axis I depressive disorder NOS Axis II deferred Axis III see medical history Axis IV mild to moderate Axis V 60-65  Plan I will continue her Zoloft 150 mg daily. She is also taking Xanax from her primary care physician. She's been regularly going to her primary care physician for physical and checkup. I recommended to continue counseling for increase coping and social skills. I explained risks and benefits of medication. I will see her again in 3 months

## 2011-12-03 ENCOUNTER — Encounter: Payer: Self-pay | Admitting: Family Medicine

## 2011-12-03 ENCOUNTER — Ambulatory Visit (INDEPENDENT_AMBULATORY_CARE_PROVIDER_SITE_OTHER): Payer: Medicare Other | Admitting: Family Medicine

## 2011-12-03 VITALS — BP 142/76 | HR 88 | Resp 18 | Ht 67.5 in | Wt 280.1 lb

## 2011-12-03 DIAGNOSIS — R202 Paresthesia of skin: Secondary | ICD-10-CM | POA: Insufficient documentation

## 2011-12-03 DIAGNOSIS — M79609 Pain in unspecified limb: Secondary | ICD-10-CM

## 2011-12-03 NOTE — Assessment & Plan Note (Signed)
Unclear cause of this, her exam is wnl, no rash, no urticaria, normal sensation on exam, no swelling or joint effusions, she is on Gabapentin for neuropathy, her diabetes is very uncontrolled. At this time she can use muscle rub Bengay as needed, advised to stop using neosporin on skin. Call if any symptoms change

## 2011-12-03 NOTE — Progress Notes (Signed)
  Subjective:    Patient ID: Laura Atkins, female    DOB: 1941-07-17, 71 y.o.   MRN: [id number]  HPI  Pain in right anterior thigh, under the skin, no rash, no new lotion or soap, using neosporin and anti-itch cream which has not helped." Feels like she wants to cut it open" Rubbing it helps some History of uncontrolled DM- A1C > 14% Denies weakness in leg, leg swelling, recent injury   Review of Systems - per above   GEN- denies fatigue, fever, weight loss,weakness, recent illness CVS- denies chest pain, palpitations RESP- denies SOB, cough, wheeze MSK- + joint pain, muscle aches, injury Neuro- denies headache, dizziness, syncope, seizure activity       Objective:   Physical Exam GEN- NAD, alert and oriented x 3, obese, walks with cane Neuro- no focal deficits, DTR lower ext equal bilat, sensation grossly in tact Skin- no rash noted, chronic hypopigmented macules on right thigh, no erythema, no excoriations, no pustules, no pain with palpation MSK- stiff ROM in bilateral knee,s no effusion noted, no pain with IR/ER hip bilat EXT- +mild pedal edema Pulses - DP, PT 2+       Assessment & Plan:

## 2011-12-03 NOTE — Patient Instructions (Addendum)
You can try a muscle rub (Bengay or [facility]) Stop the neosporin and the cortisone  If you develop any rash or swelling please call Work on your diabetes F/U in May as scheduled

## 2011-12-13 ENCOUNTER — Ambulatory Visit (INDEPENDENT_AMBULATORY_CARE_PROVIDER_SITE_OTHER): Payer: Medicare Other | Admitting: Family Medicine

## 2011-12-13 ENCOUNTER — Encounter: Payer: Self-pay | Admitting: Family Medicine

## 2011-12-13 VITALS — BP 140/70 | HR 88 | Resp 18 | Ht 67.5 in | Wt 274.1 lb

## 2011-12-13 DIAGNOSIS — E785 Hyperlipidemia, unspecified: Secondary | ICD-10-CM

## 2011-12-13 DIAGNOSIS — F329 Major depressive disorder, single episode, unspecified: Secondary | ICD-10-CM

## 2011-12-13 DIAGNOSIS — E109 Type 1 diabetes mellitus without complications: Secondary | ICD-10-CM

## 2011-12-13 DIAGNOSIS — M1990 Unspecified osteoarthritis, unspecified site: Secondary | ICD-10-CM

## 2011-12-13 DIAGNOSIS — I10 Essential (primary) hypertension: Secondary | ICD-10-CM

## 2011-12-13 DIAGNOSIS — M899 Disorder of bone, unspecified: Secondary | ICD-10-CM

## 2011-12-13 DIAGNOSIS — M549 Dorsalgia, unspecified: Secondary | ICD-10-CM

## 2011-12-13 DIAGNOSIS — D649 Anemia, unspecified: Secondary | ICD-10-CM

## 2011-12-13 DIAGNOSIS — M949 Disorder of cartilage, unspecified: Secondary | ICD-10-CM

## 2011-12-13 MED ORDER — KETOROLAC TROMETHAMINE 60 MG/2ML IJ SOLN
60.0000 mg | Freq: Once | INTRAMUSCULAR | Status: AC
  Administered 2011-12-13: 60 mg via INTRAMUSCULAR

## 2011-12-13 NOTE — Assessment & Plan Note (Addendum)
Elevated systolic, the importance of dietary change as far as sodium intake , is stressed, no med change

## 2011-12-13 NOTE — Patient Instructions (Signed)
F/U in early July.Rectal exam at that visit.  Call if you need me before please   Fasting lipid, cmp and EGFr , CBc, TSH and vit D June 30 or after.  You will get toradol 60mg  iM in the office for back pain and headache  You are referred for evaluation and treatment of back and right hip pain  A healthy diet is rich in fruit, vegetables and whole grains. Poultry fish, nuts and beans are a healthy choice for protein rather then red meat. A low sodium diet and drinking 64 ounces of water daily is generally recommended. Oils and sweet should be limited. Carbohydrates especially for those who are diabetic or overweight, should be limited to 34-45 gram per meal. It is important to eat on a regular schedule, at least 3 times daily. Snacks should be primarily fruits, vegetables or nuts.   I hope you feel better soon

## 2011-12-13 NOTE — Progress Notes (Signed)
  Subjective:    Patient ID: Laura Atkins, female    DOB: [birth year], 71 y.o.   MRN: [id number]  HPI The PT is here for follow up and re-evaluation of chronic medical conditions, medication management and review of any available recent lab and radiology data.  Preventive health is updated, specifically  Cancer screening and Immunization.  Refuses rectal at this visit Questions or concerns regarding consultations or procedures which the PT has had in the interim are  addressed. The PT denies any adverse reactions to current medications since the last visit.  There are no new concerns.  Here with same complaint recently seen for of uncontrolled back pain with right lower extremity weakness and numbness, noted to drag leg and be unsteady on her feet Blood sugars continue to be uncontrolled since her eating habits remain poor.    Review of Systems    See HPI Denies recent fever or chills. Denies sinus pressure, nasal congestion, ear pain or sore throat. Denies chest congestion, productive cough or wheezing. Denies chest pains, palpitations and leg swelling Denies abdominal pain, nausea, vomiting,diarrhea or constipation.   Denies dysuria, frequency, hesitancy or incontinence. Denies headaches, seizures, numbness, or tingling. Denies uncontrolled  depression, anxiety or insomnia. Denies skin break down or rash.     Objective:   Physical Exam Patient alert and oriented and in no cardiopulmonary distress.  HEENT: No facial asymmetry, EOMI, no sinus tenderness,  oropharynx pink and moist.  Neck decreased ROM no adenopathy.  Chest: Clear to auscultation bilaterally.  CVS: S1, S2 no murmurs, no S3.  ABD: Soft non tender. Bowel sounds normal.  Ext: No edema  [id number]  ROM spine, shoulders, hips and knees.  Skin: Intact, no ulcerations or rash noted.  Psych: Good eye contact, normal affect. Memory intact not anxious or depressed appearing.  CNS: CN 2-12 intact,reduced   power, and sensation in right lower extremity        Assessment & Plan:

## 2011-12-13 NOTE — Assessment & Plan Note (Signed)
Controlled, no change in medication  

## 2011-12-15 NOTE — Assessment & Plan Note (Signed)
Controlled, no change in medication Pt also to continue with therapy which has helped alot

## 2011-12-15 NOTE — Assessment & Plan Note (Addendum)
Acute flare of back and lower extremity pain, pt also has weakness in the lower ext, Referred to pain management or epidural at this tim

## 2011-12-15 NOTE — Assessment & Plan Note (Signed)
Uncontrolled and followed by endo

## 2012-01-02 ENCOUNTER — Other Ambulatory Visit: Payer: Self-pay | Admitting: Family Medicine

## 2012-01-03 ENCOUNTER — Other Ambulatory Visit: Payer: Self-pay | Admitting: Family Medicine

## 2012-01-08 ENCOUNTER — Other Ambulatory Visit: Payer: Self-pay | Admitting: Family Medicine

## 2012-01-09 ENCOUNTER — Encounter (HOSPITAL_COMMUNITY): Payer: Self-pay | Admitting: Psychiatry

## 2012-01-09 ENCOUNTER — Ambulatory Visit (INDEPENDENT_AMBULATORY_CARE_PROVIDER_SITE_OTHER): Payer: Medicare Other | Admitting: Psychiatry

## 2012-01-09 DIAGNOSIS — F329 Major depressive disorder, single episode, unspecified: Secondary | ICD-10-CM

## 2012-01-11 ENCOUNTER — Telehealth: Payer: Self-pay | Admitting: Family Medicine

## 2012-01-11 ENCOUNTER — Ambulatory Visit (HOSPITAL_COMMUNITY)
Admission: RE | Admit: 2012-01-11 | Discharge: 2012-01-11 | Disposition: A | Discharge status: Home / Self Care | Payer: Medicare Other | Source: Ambulatory Visit | Attending: Family Medicine | Admitting: Family Medicine

## 2012-01-11 ENCOUNTER — Other Ambulatory Visit: Payer: Self-pay | Admitting: Family Medicine

## 2012-01-11 DIAGNOSIS — S299XXA Unspecified injury of thorax, initial encounter: Secondary | ICD-10-CM

## 2012-01-11 DIAGNOSIS — R079 Chest pain, unspecified: Secondary | ICD-10-CM | POA: Insufficient documentation

## 2012-01-11 DIAGNOSIS — S298XXA Other specified injuries of thorax, initial encounter: Secondary | ICD-10-CM | POA: Insufficient documentation

## 2012-01-11 DIAGNOSIS — X58XXXA Exposure to other specified factors, initial encounter: Secondary | ICD-10-CM | POA: Insufficient documentation

## 2012-01-11 NOTE — Patient Instructions (Signed)
Discussed orally 

## 2012-01-11 NOTE — Telephone Encounter (Signed)
Spoke with pt, having right chest pain following recent trauma, she is to have a cxr, she states she has put a rail on her bed also

## 2012-01-11 NOTE — Progress Notes (Signed)
Patient:  Laura Atkins   DOB: October 24, 1940  MR Number: [id number]  Location: [place]:  [address] [place],  [place], [address]  Start: Wednesday 01/09/2012 11:00 AM End: Wednesday 01/09/2012 11:50 PM  Provider/Observer:     Maurice Small, MSW, LCSW   Chief Complaint:      Chief Complaint  Patient presents with  . Depression    Reason For Service:     The patient was referred for services by primary care physician Dr. Tula Nakayama do to patient experiencing symptoms of depression. The patient has a long-standing history of depression. She was referred to this practice for continuity of care as she was seen at [facility] but was having difficulty scheduling a time to see a therapist. Her stressors included marital issues, declining health, family issues, and financial problems. Patient is seen for a follow up appointment today.  Interventions Strategy:  Supportive therapy, cognitive behavioral therapy  Participation Level:   Active  Participation Quality:  Appropriate      Behavioral Observation:  Fairly Groomed, Alert, and Appropriate.   Current Psychosocial Factors: The patient reports sadness as more acquaintances of the family have died in recent months. She reports stress related to son's involvement with his estranged wife.  Content of Session:   Reviewing symptoms, identifying stressors, identifying areas within patient's control, reframing negative thoughts, identifying realistic expectations  Current Status:   The patient is experiencing improved mood, increased social involvement with family, but continued excessive worry.  Patient Progress:   Good. Patient reports decreased depressed mood and decreased anxiety. Although she still has a tendency to worry about her children and grandchildren as well as has negative thoughts about her pastor's past, she expresses an increased level of acceptance of others' choices. She reports recently going to dinner with her  son and daughter-in-law.  She reports disliking the fact her son was not forthcoming about his plans for the family to have dinner together but states she made the best of it.  Therapist works with patient to identify areas within her control and to identify realistic expectations.   Target Goals:   1. Improve ability to set, maintain, and respect boundaries. 2. Decrease anxiety and excessive worry.  Last Reviewed:   08/21/2010  Goals Addressed Today:    Improve ability to set, maintain, and respect boundaries, decrease anxiety and excessive worry  Impression/Diagnosis:   The patient has been suffering symptoms of depression intermittently for the past 6-7 years. She continues to experience anxiety, excessive worrying, and depressed mood. Diagnosis: Depressive disorder NOS, anxiety disorder NOS  Diagnosis:  Axis I:  1. Depressive disorder             Axis II: Deferred

## 2012-01-14 ENCOUNTER — Telehealth: Payer: Self-pay | Admitting: Family Medicine

## 2012-01-14 NOTE — Telephone Encounter (Signed)
Pt given results and she stated that she is feeling no better and is having rib pain when coughing on the right side.

## 2012-01-15 ENCOUNTER — Other Ambulatory Visit: Payer: Self-pay | Admitting: Family Medicine

## 2012-01-15 NOTE — Telephone Encounter (Signed)
Advise she likely has bruising in muscles where she fell , and this would cause pain, no mention is made of any visible rib fracture

## 2012-01-16 ENCOUNTER — Telehealth: Payer: Self-pay | Admitting: Family Medicine

## 2012-01-16 ENCOUNTER — Other Ambulatory Visit: Payer: Self-pay

## 2012-01-16 MED ORDER — ALPRAZOLAM 0.5 MG PO TABS: 0.5000 mg | ORAL_TABLET | Freq: Three times a day (TID) | ORAL | Status: DC

## 2012-01-16 NOTE — Telephone Encounter (Signed)
Called into [facility]

## 2012-01-17 ENCOUNTER — Encounter (INDEPENDENT_AMBULATORY_CARE_PROVIDER_SITE_OTHER): Payer: Medicare Other | Admitting: Ophthalmology

## 2012-01-17 DIAGNOSIS — H43819 Vitreous degeneration, unspecified eye: Secondary | ICD-10-CM

## 2012-01-17 DIAGNOSIS — E1165 Type 2 diabetes mellitus with hyperglycemia: Secondary | ICD-10-CM

## 2012-01-17 DIAGNOSIS — H35039 Hypertensive retinopathy, unspecified eye: Secondary | ICD-10-CM

## 2012-01-17 DIAGNOSIS — E11319 Type 2 diabetes mellitus with unspecified diabetic retinopathy without macular edema: Secondary | ICD-10-CM

## 2012-01-17 DIAGNOSIS — I10 Essential (primary) hypertension: Secondary | ICD-10-CM

## 2012-01-17 DIAGNOSIS — H3581 Retinal edema: Secondary | ICD-10-CM

## 2012-01-17 DIAGNOSIS — E1139 Type 2 diabetes mellitus with other diabetic ophthalmic complication: Secondary | ICD-10-CM

## 2012-01-17 NOTE — Telephone Encounter (Signed)
Pt aware.

## 2012-01-17 NOTE — Telephone Encounter (Signed)
Called back- phone busy

## 2012-01-22 ENCOUNTER — Ambulatory Visit (HOSPITAL_COMMUNITY): Payer: Medicare Other | Admitting: Psychiatry

## 2012-01-23 ENCOUNTER — Inpatient Hospital Stay (HOSPITAL_COMMUNITY)
Admission: EM | Admit: 2012-01-23 | Discharge: 2012-01-29 | DRG: 896 | Disposition: A | Discharge status: Home / Self Care | Payer: Medicare Other | Attending: Internal Medicine | Admitting: Internal Medicine

## 2012-01-23 ENCOUNTER — Telehealth: Payer: Self-pay | Admitting: Family Medicine

## 2012-01-23 ENCOUNTER — Emergency Department (HOSPITAL_COMMUNITY): Payer: Medicare Other

## 2012-01-23 ENCOUNTER — Other Ambulatory Visit: Payer: Self-pay

## 2012-01-23 ENCOUNTER — Encounter (HOSPITAL_COMMUNITY): Payer: Self-pay | Admitting: *Deleted

## 2012-01-23 DIAGNOSIS — Z23 Encounter for immunization: Secondary | ICD-10-CM

## 2012-01-23 DIAGNOSIS — M1990 Unspecified osteoarthritis, unspecified site: Secondary | ICD-10-CM

## 2012-01-23 DIAGNOSIS — B369 Superficial mycosis, unspecified: Secondary | ICD-10-CM

## 2012-01-23 DIAGNOSIS — E860 Dehydration: Secondary | ICD-10-CM

## 2012-01-23 DIAGNOSIS — M542 Cervicalgia: Secondary | ICD-10-CM

## 2012-01-23 DIAGNOSIS — I10 Essential (primary) hypertension: Secondary | ICD-10-CM

## 2012-01-23 DIAGNOSIS — R5383 Other fatigue: Secondary | ICD-10-CM | POA: Diagnosis present

## 2012-01-23 DIAGNOSIS — E669 Obesity, unspecified: Secondary | ICD-10-CM

## 2012-01-23 DIAGNOSIS — J9600 Acute respiratory failure, unspecified whether with hypoxia or hypercapnia: Secondary | ICD-10-CM | POA: Diagnosis not present

## 2012-01-23 DIAGNOSIS — R519 Headache, unspecified: Secondary | ICD-10-CM | POA: Diagnosis present

## 2012-01-23 DIAGNOSIS — E785 Hyperlipidemia, unspecified: Secondary | ICD-10-CM

## 2012-01-23 DIAGNOSIS — D649 Anemia, unspecified: Secondary | ICD-10-CM

## 2012-01-23 DIAGNOSIS — F32A Depression, unspecified: Secondary | ICD-10-CM

## 2012-01-23 DIAGNOSIS — F19239 Other psychoactive substance dependence with withdrawal, unspecified: Principal | ICD-10-CM | POA: Diagnosis present

## 2012-01-23 DIAGNOSIS — T424X5A Adverse effect of benzodiazepines, initial encounter: Secondary | ICD-10-CM | POA: Diagnosis present

## 2012-01-23 DIAGNOSIS — M549 Dorsalgia, unspecified: Secondary | ICD-10-CM

## 2012-01-23 DIAGNOSIS — J209 Acute bronchitis, unspecified: Secondary | ICD-10-CM

## 2012-01-23 DIAGNOSIS — R269 Unspecified abnormalities of gait and mobility: Secondary | ICD-10-CM

## 2012-01-23 DIAGNOSIS — R51 Headache: Secondary | ICD-10-CM

## 2012-01-23 DIAGNOSIS — G4710 Hypersomnia, unspecified: Secondary | ICD-10-CM

## 2012-01-23 DIAGNOSIS — R32 Unspecified urinary incontinence: Secondary | ICD-10-CM

## 2012-01-23 DIAGNOSIS — Z9181 History of falling: Secondary | ICD-10-CM

## 2012-01-23 DIAGNOSIS — M79609 Pain in unspecified limb: Secondary | ICD-10-CM

## 2012-01-23 DIAGNOSIS — J40 Bronchitis, not specified as acute or chronic: Secondary | ICD-10-CM

## 2012-01-23 DIAGNOSIS — F1320 Sedative, hypnotic or anxiolytic dependence, uncomplicated: Secondary | ICD-10-CM | POA: Diagnosis present

## 2012-01-23 DIAGNOSIS — R739 Hyperglycemia, unspecified: Secondary | ICD-10-CM

## 2012-01-23 DIAGNOSIS — R4182 Altered mental status, unspecified: Secondary | ICD-10-CM

## 2012-01-23 DIAGNOSIS — M25569 Pain in unspecified knee: Secondary | ICD-10-CM

## 2012-01-23 DIAGNOSIS — E876 Hypokalemia: Secondary | ICD-10-CM | POA: Diagnosis present

## 2012-01-23 DIAGNOSIS — R259 Unspecified abnormal involuntary movements: Secondary | ICD-10-CM

## 2012-01-23 DIAGNOSIS — R531 Weakness: Secondary | ICD-10-CM

## 2012-01-23 DIAGNOSIS — E119 Type 2 diabetes mellitus without complications: Secondary | ICD-10-CM | POA: Diagnosis present

## 2012-01-23 DIAGNOSIS — R5381 Other malaise: Secondary | ICD-10-CM | POA: Diagnosis present

## 2012-01-23 DIAGNOSIS — R569 Unspecified convulsions: Secondary | ICD-10-CM

## 2012-01-23 DIAGNOSIS — M25519 Pain in unspecified shoulder: Secondary | ICD-10-CM

## 2012-01-23 DIAGNOSIS — D560 Alpha thalassemia: Secondary | ICD-10-CM | POA: Diagnosis present

## 2012-01-23 DIAGNOSIS — G929 Unspecified toxic encephalopathy: Secondary | ICD-10-CM | POA: Diagnosis present

## 2012-01-23 DIAGNOSIS — F0390 Unspecified dementia without behavioral disturbance: Secondary | ICD-10-CM | POA: Diagnosis present

## 2012-01-23 DIAGNOSIS — G92 Toxic encephalopathy: Secondary | ICD-10-CM | POA: Diagnosis present

## 2012-01-23 DIAGNOSIS — K219 Gastro-esophageal reflux disease without esophagitis: Secondary | ICD-10-CM

## 2012-01-23 DIAGNOSIS — F3289 Other specified depressive episodes: Secondary | ICD-10-CM

## 2012-01-23 DIAGNOSIS — Z9911 Dependence on respirator [ventilator] status: Secondary | ICD-10-CM

## 2012-01-23 DIAGNOSIS — F19939 Other psychoactive substance use, unspecified with withdrawal, unspecified: Principal | ICD-10-CM | POA: Diagnosis present

## 2012-01-23 DIAGNOSIS — M899 Disorder of bone, unspecified: Secondary | ICD-10-CM

## 2012-01-23 DIAGNOSIS — R112 Nausea with vomiting, unspecified: Secondary | ICD-10-CM

## 2012-01-23 DIAGNOSIS — A088 Other specified intestinal infections: Secondary | ICD-10-CM | POA: Diagnosis present

## 2012-01-23 DIAGNOSIS — F329 Major depressive disorder, single episode, unspecified: Secondary | ICD-10-CM

## 2012-01-23 DIAGNOSIS — Z6838 Body mass index (BMI) 38.0-38.9, adult: Secondary | ICD-10-CM

## 2012-01-23 DIAGNOSIS — F411 Generalized anxiety disorder: Secondary | ICD-10-CM | POA: Diagnosis present

## 2012-01-23 DIAGNOSIS — F341 Dysthymic disorder: Secondary | ICD-10-CM | POA: Diagnosis present

## 2012-01-23 DIAGNOSIS — R1110 Vomiting, unspecified: Secondary | ICD-10-CM | POA: Diagnosis present

## 2012-01-23 DIAGNOSIS — I951 Orthostatic hypotension: Secondary | ICD-10-CM

## 2012-01-23 DIAGNOSIS — Z794 Long term (current) use of insulin: Secondary | ICD-10-CM

## 2012-01-23 DIAGNOSIS — Z7982 Long term (current) use of aspirin: Secondary | ICD-10-CM

## 2012-01-23 DIAGNOSIS — R1900 Intra-abdominal and pelvic swelling, mass and lump, unspecified site: Secondary | ICD-10-CM

## 2012-01-23 DIAGNOSIS — J9601 Acute respiratory failure with hypoxia: Secondary | ICD-10-CM

## 2012-01-23 DIAGNOSIS — E109 Type 1 diabetes mellitus without complications: Secondary | ICD-10-CM

## 2012-01-23 LAB — URINALYSIS, ROUTINE W REFLEX MICROSCOPIC
Bilirubin Urine: NEGATIVE
Glucose, UA: 500 mg/dL — AB
Hgb urine dipstick: NEGATIVE
Ketones, ur: NEGATIVE mg/dL
Leukocytes, UA: NEGATIVE
Nitrite: NEGATIVE
Protein, ur: NEGATIVE mg/dL
Specific Gravity, Urine: 1.01 (ref 1.005–1.030)
Urobilinogen, UA: 0.2 mg/dL (ref 0.0–1.0)
pH: 6 (ref 5.0–8.0)

## 2012-01-23 LAB — DIFFERENTIAL
Basophils Absolute: 0 10*3/uL (ref 0.0–0.1)
Basophils Relative: 0 % (ref 0–1)
Eosinophils Absolute: 0.1 10*3/uL (ref 0.0–0.7)
Eosinophils Relative: 1 % (ref 0–5)
Lymphocytes Relative: 38 % (ref 12–46)
Lymphs Abs: 2.6 10*3/uL (ref 0.7–4.0)
Monocytes Absolute: 0.4 10*3/uL (ref 0.1–1.0)
Monocytes Relative: 6 % (ref 3–12)
Neutro Abs: 3.8 10*3/uL (ref 1.7–7.7)
Neutrophils Relative %: 55 % (ref 43–77)

## 2012-01-23 LAB — HEPATIC FUNCTION PANEL
ALT: 16 U/L (ref 0–35)
AST: 15 U/L (ref 0–37)
Albumin: 3.5 g/dL (ref 3.5–5.2)
Alkaline Phosphatase: 117 U/L (ref 39–117)
Bilirubin, Direct: <0.1 mg/dL (ref 0.0–0.3)
Total Bilirubin: 0.3 mg/dL (ref 0.3–1.2)
Total Protein: 7.4 g/dL (ref 6.0–8.3)

## 2012-01-23 LAB — CBC
HCT: 35.8 % — ABNORMAL LOW (ref 36.0–46.0)
Hemoglobin: 11.2 g/dL — ABNORMAL LOW (ref 12.0–15.0)
MCH: 21 pg — ABNORMAL LOW (ref 26.0–34.0)
MCHC: 31.3 g/dL (ref 30.0–36.0)
MCV: 67 fL — ABNORMAL LOW (ref 78.0–100.0)
Platelets: 201 10*3/uL (ref 150–400)
RBC: 5.34 MIL/uL — ABNORMAL HIGH (ref 3.87–5.11)
RDW: 14.6 % (ref 11.5–15.5)
WBC: 6.9 10*3/uL (ref 4.0–10.5)

## 2012-01-23 LAB — BASIC METABOLIC PANEL
BUN: 9 mg/dL (ref 6–23)
CO2: 30 mEq/L (ref 19–32)
Calcium: 9.4 mg/dL (ref 8.4–10.5)
Chloride: 94 mEq/L — ABNORMAL LOW (ref 96–112)
Creatinine, Ser: 0.59 mg/dL (ref 0.50–1.10)
GFR calc Af Amer: >90 mL/min (ref >90)
GFR calc non Af Amer: >90 mL/min (ref >90)
Glucose, Bld: 234 mg/dL — ABNORMAL HIGH (ref 70–99)
Potassium: 3.6 mEq/L (ref 3.5–5.1)
Sodium: 134 mEq/L — ABNORMAL LOW (ref 135–145)

## 2012-01-23 LAB — GLUCOSE, CAPILLARY
Glucose-Capillary: 198 mg/dL — ABNORMAL HIGH (ref 70–99)
Glucose-Capillary: 258 mg/dL — ABNORMAL HIGH (ref 70–99)

## 2012-01-23 LAB — LIPASE, BLOOD: Lipase: 29 U/L (ref 11–59)

## 2012-01-23 MED ORDER — INSULIN ASPART 100 UNIT/ML ~~LOC~~ SOLN
0.0000 [IU] | Freq: Three times a day (TID) | SUBCUTANEOUS | Status: DC
  Administered 2012-01-24: 3 [IU] via SUBCUTANEOUS
  Administered 2012-01-24: 5 [IU] via SUBCUTANEOUS
  Administered 2012-01-24: 3 [IU] via SUBCUTANEOUS
  Administered 2012-01-25 (×2): 5 [IU] via SUBCUTANEOUS

## 2012-01-23 MED ORDER — DICLOFENAC SODIUM 75 MG PO TBEC
75.0000 mg | DELAYED_RELEASE_TABLET | Freq: Two times a day (BID) | ORAL | Status: DC
  Administered 2012-01-23: 75 mg via ORAL
  Filled 2012-01-23 (×5): qty 1

## 2012-01-23 MED ORDER — ASPIRIN 81 MG PO CHEW
81.0000 mg | CHEWABLE_TABLET | Freq: Every day | ORAL | Status: DC
  Administered 2012-01-23 – 2012-01-25 (×3): 81 mg via ORAL
  Filled 2012-01-23 (×3): qty 1

## 2012-01-23 MED ORDER — DICLOFENAC SODIUM 50 MG PO TBEC
DELAYED_RELEASE_TABLET | ORAL | Status: AC
  Filled 2012-01-23: qty 2

## 2012-01-23 MED ORDER — ONDANSETRON HCL 4 MG PO TABS
4.0000 mg | ORAL_TABLET | Freq: Four times a day (QID) | ORAL | Status: DC | PRN
  Administered 2012-01-25: 4 mg via ORAL

## 2012-01-23 MED ORDER — ACETAMINOPHEN 325 MG PO TABS
650.0000 mg | ORAL_TABLET | Freq: Four times a day (QID) | ORAL | Status: DC | PRN
  Administered 2012-01-23 – 2012-01-25 (×5): 650 mg via ORAL
  Filled 2012-01-23 (×4): qty 2

## 2012-01-23 MED ORDER — SODIUM CHLORIDE 0.9 % IV SOLN
INTRAVENOUS | Status: DC
  Administered 2012-01-23: 18:00:00 via INTRAVENOUS

## 2012-01-23 MED ORDER — INSULIN ASPART 100 UNIT/ML ~~LOC~~ SOLN
0.0000 [IU] | Freq: Every day | SUBCUTANEOUS | Status: DC
  Administered 2012-01-23: 3 [IU] via SUBCUTANEOUS
  Administered 2012-01-24: 2 [IU] via SUBCUTANEOUS

## 2012-01-23 MED ORDER — AMLODIPINE BESYLATE 10 MG PO TABS
10.0000 mg | ORAL_TABLET | Freq: Every day | ORAL | Status: DC
  Administered 2012-01-23 – 2012-01-25 (×3): 10 mg via ORAL
  Filled 2012-01-23: qty 1
  Filled 2012-01-23 (×2): qty 2

## 2012-01-23 MED ORDER — SERTRALINE HCL 50 MG PO TABS
50.0000 mg | ORAL_TABLET | Freq: Every day | ORAL | Status: DC
  Administered 2012-01-23 – 2012-01-25 (×3): 50 mg via ORAL
  Filled 2012-01-23 (×3): qty 1

## 2012-01-23 MED ORDER — ATORVASTATIN CALCIUM 40 MG PO TABS
40.0000 mg | ORAL_TABLET | Freq: Every day | ORAL | Status: DC
  Administered 2012-01-23 – 2012-01-24 (×2): 40 mg via ORAL
  Filled 2012-01-23 (×3): qty 1

## 2012-01-23 MED ORDER — INSULIN GLARGINE 100 UNIT/ML ~~LOC~~ SOLN
10.0000 [IU] | Freq: Every day | SUBCUTANEOUS | Status: DC
  Administered 2012-01-23 – 2012-01-24 (×2): 10 [IU] via SUBCUTANEOUS

## 2012-01-23 MED ORDER — HYDROCODONE-ACETAMINOPHEN 5-325 MG PO TABS
1.0000 | ORAL_TABLET | Freq: Once | ORAL | Status: AC
  Administered 2012-01-23: 1 via ORAL
  Filled 2012-01-23: qty 1

## 2012-01-23 MED ORDER — ALPRAZOLAM 0.25 MG PO TABS
0.2500 mg | ORAL_TABLET | Freq: Three times a day (TID) | ORAL | Status: DC | PRN
  Administered 2012-01-23: 0.25 mg via ORAL
  Filled 2012-01-23: qty 1

## 2012-01-23 MED ORDER — ENOXAPARIN SODIUM 40 MG/0.4ML ~~LOC~~ SOLN
40.0000 mg | SUBCUTANEOUS | Status: DC
  Administered 2012-01-23 – 2012-01-28 (×6): 40 mg via SUBCUTANEOUS
  Filled 2012-01-23 (×8): qty 0.4

## 2012-01-23 MED ORDER — ACETAMINOPHEN 650 MG RE SUPP
650.0000 mg | Freq: Four times a day (QID) | RECTAL | Status: DC | PRN
  Administered 2012-01-25: 650 mg via RECTAL
  Filled 2012-01-23 (×2): qty 1

## 2012-01-23 MED ORDER — GABAPENTIN 300 MG PO CAPS
300.0000 mg | ORAL_CAPSULE | Freq: Three times a day (TID) | ORAL | Status: DC
  Administered 2012-01-23 (×2): 300 mg via ORAL
  Filled 2012-01-23 (×2): qty 1

## 2012-01-23 MED ORDER — SODIUM CHLORIDE 0.9 % IV SOLN
INTRAVENOUS | Status: DC
  Administered 2012-01-24 – 2012-01-25 (×3): via INTRAVENOUS

## 2012-01-23 MED ORDER — BENAZEPRIL HCL 20 MG PO TABS
20.0000 mg | ORAL_TABLET | Freq: Every day | ORAL | Status: DC
  Administered 2012-01-23 – 2012-01-25 (×3): 20 mg via ORAL
  Filled 2012-01-23: qty 1
  Filled 2012-01-23 (×2): qty 2

## 2012-01-23 MED ORDER — ONDANSETRON HCL 4 MG/2ML IJ SOLN
4.0000 mg | Freq: Four times a day (QID) | INTRAMUSCULAR | Status: DC | PRN
  Filled 2012-01-23 (×2): qty 2

## 2012-01-23 MED ORDER — KETOROLAC TROMETHAMINE 0.4 % OP SOLN
1.0000 [drp] | Freq: Four times a day (QID) | OPHTHALMIC | Status: DC
  Filled 2012-01-23 (×33): qty 5

## 2012-01-23 NOTE — ED Provider Notes (Addendum)
History    This chart was scribed for Laura Apo, MD, MD by Rhae Lerner. The patient was seen in room APA09 and the patient's care was started at 12:39PM.   CSN: [id number]  Arrival date & time 01/23/12  1138   First MD Initiated Contact with Patient 01/23/12 1232      Chief Complaint  Patient presents with  . Headache    (Consider location/radiation/quality/duration/timing/severity/associated sxs/prior treatment) Patient is a 71 y.o. female presenting with headaches. The history is provided by the patient.  Headache  This is a chronic problem.   Laura Atkins is a 71 y.o. female who presents to the Emergency Department complaining of moderate headache onset 2 weeks ago. Pt reports that she fell out of bed 2 weeks ago and hit her head on the floor (carpet) causing the headache. Pt reports that the pain has gotten worse. She reports having nausea. Pt reports being in ED and getting xray of abdomen. She denies leg injury or pain. The pain has been constant since onset without radiation. Pt had laser surgery in right eye 6 days ago due to diabetes. Pt had elevated blood glucose per family this AM.    Past Medical History  Diagnosis Date  . Hypertension   . Diabetes mellitus   . Hyperlipidemia   . Obesity   . Anemia   . Anxiety   . Depression   . GERD (gastroesophageal reflux disease)   . Pruritus   . Osteoarthritis   . Right shoulder pain   . Left knee pain   . Hypersomnia   . Urinary incontinence     Past Surgical History  Procedure Date  . Abdominal hysterectomy   . Cyst removed left breast   . Cholecystectomy     History reviewed. No pertinent family history.  History  Substance Use Topics  . Smoking status: Never Smoker   . Smokeless tobacco: Never Used  . Alcohol Use: No    OB History    Grav Para Term Preterm Abortions TAB SAB Ect Mult Living                  Review of Systems  Neurological: Positive for headaches.  All other systems  reviewed and are negative.   10 Systems reviewed and all are negative for acute change except as noted in the HPI.   Allergies  Penicillins; Prednisone; and Propoxyphene-acetaminophen  Home Medications   Current Outpatient Rx  Name Route Sig Dispense Refill  . ALPRAZOLAM 0.5 MG PO TABS Oral Take 1 tablet (0.5 mg total) by mouth 3 (three) times daily. 90 tablet 3  . AMLODIPINE BESYLATE 10 MG PO TABS Oral Take 1 tablet (10 mg total) by mouth daily. 30 tablet 5  . ASPIRIN 81 MG PO TABS Oral Take 81 mg by mouth daily.      . ATORVASTATIN CALCIUM 40 MG PO TABS Oral Take 1 tablet (40 mg total) by mouth daily. 30 tablet 11    Pt to discontinue lovastatin and start atorvastati ...  . BD INSULIN SYRINGE ULTRAFINE 30G X 1/2" 1 ML MISC  USE AS DIRECTED 100 each 2  . BENAZEPRIL HCL 20 MG PO TABS  TAKE ONE TABLET BY MOUTH EVERY DAY 90 tablet 2  . CLOTRIMAZOLE-BETAMETHASONE 1-0.05 % EX CREA  Apply to affected area 2 times daily 45 g 1  . CYCLOBENZAPRINE HCL 10 MG PO TABS Oral Take 10 mg by mouth at bedtime.      Marland Kitchen  DICLOFENAC SODIUM 75 MG PO TBEC Oral Take 75 mg by mouth 2 (two) times daily as needed.      . FUROSEMIDE 40 MG PO TABS Oral Take 1 tablet (40 mg total) by mouth 2 (two) times daily. 45 tablet 5  . GABAPENTIN 300 MG PO CAPS Oral Take 1 capsule (300 mg total) by mouth 3 (three) times daily. 90 capsule 5  . GLUCOSE BLOOD VI STRP  Three times daily testing  DX 250.00 100 each 11  . HYDROCODONE-ACETAMINOPHEN 5-325 MG PO TABS Oral Take 1 tablet by mouth 2 (two) times daily as needed.      Marland Kitchen HYDROXYZINE HCL 25 MG PO TABS Oral Take 1 tablet (25 mg total) by mouth at bedtime as needed. 30 tablet 5  . INSULIN LISPRO PROT & LISPRO (75-25) 100 UNIT/ML [place] SUSP Subcutaneous Inject into the skin. Per sliding scale     . ONETOUCH ULTRASOFT LANCETS MISC  Use as instructed THREE TIMES DAILY  Dx 250.00 100 each 12  . LOVASTATIN 40 MG PO TABS  Two tabs at bedtime 60 tablet 0  . MULTIVITAMINS PO TABS Oral  Take 1 tablet by mouth daily.      Marland Kitchen NAPROXEN-ESOMEPRAZOLE 375-20 MG PO TBEC Oral Take 1 tablet by mouth 2 (two) times daily. 60 tablet 1  . SERTRALINE HCL 50 MG PO TABS Oral Take 1 tablet (50 mg total) by mouth daily. Three tabs daily 90 tablet 2  . VITAMIN B-12 500 MCG PO TABS Oral Take 500 mcg by mouth daily.        BP 149/54  Pulse 67  Temp(Src) 98.8 F (37.1 C) (Oral)  Resp 18  Ht 5' 7.5" (1.715 m)  Wt 274 lb (124.286 kg)  BMI 42.28 kg/m2  SpO2 100%  Physical Exam  Nursing note and vitals reviewed. Constitutional: She is oriented to person, place, and time. She appears well-developed and well-nourished. No distress.  HENT:  Head: Normocephalic and atraumatic.  Eyes: Conjunctivae are normal. Pupils are equal, round, and reactive to light.  Neck: Normal range of motion. No thyromegaly present.  Pulmonary/Chest: Effort normal. No respiratory distress.  Neurological: She is alert and oriented to person, place, and time.       Pt is right hand dominant  Skin: Skin is warm and dry.  Psychiatric: She has a normal mood and affect. Her behavior is normal.    ED Course  Procedures (including critical care time) DIAGNOSTIC STUDIES: Oxygen Saturation is 100% on room air, normal by my interpretation.    COORDINATION OF CARE: 12:42PM EDP discusses pt ED treatment with pt.  2:27PM EDP orders medication: norco 5-325 mg   Results for orders placed during the hospital encounter of 01/23/12  CBC      Component Value Range   WBC 6.9  4.0 - 10.5 (K/uL)   RBC 5.34 (*) 3.87 - 5.11 (MIL/uL)   Hemoglobin 11.2 (*) 12.0 - 15.0 (g/dL)   HCT 35.8 (*) 36.0 - 46.0 (%)   MCV 67.0 (*) 78.0 - 100.0 (fL)   MCH 21.0 (*) 26.0 - 34.0 (pg)   MCHC 31.3  30.0 - 36.0 (g/dL)   RDW 14.6  11.5 - 15.5 (%)   Platelets 201  150 - 400 (K/uL)  DIFFERENTIAL      Component Value Range   Neutrophils Relative 55  43 - 77 (%)   Neutro Abs 3.8  1.7 - 7.7 (K/uL)   Lymphocytes Relative 38  12 - 46 (%)  Lymphs  Abs 2.6  0.7 - 4.0 (K/uL)   Monocytes Relative 6  3 - 12 (%)   Monocytes Absolute 0.4  0.1 - 1.0 (K/uL)   Eosinophils Relative 1  0 - 5 (%)   Eosinophils Absolute 0.1  0.0 - 0.7 (K/uL)   Basophils Relative 0  0 - 1 (%)   Basophils Absolute 0.0  0.0 - 0.1 (K/uL)  BASIC METABOLIC PANEL      Component Value Range   Sodium 134 (*) 135 - 145 (mEq/L)   Potassium 3.6  3.5 - 5.1 (mEq/L)   Chloride 94 (*) 96 - 112 (mEq/L)   CO2 30  19 - 32 (mEq/L)   Glucose, Bld 234 (*) 70 - 99 (mg/dL)   BUN 9  6 - 23 (mg/dL)   Creatinine, Ser 0.59  0.50 - 1.10 (mg/dL)   Calcium 9.4  8.4 - 10.5 (mg/dL)   GFR calc non Af Amer >90  >90 (mL/min)   GFR calc Af Amer >90  >90 (mL/min)  URINALYSIS, ROUTINE W REFLEX MICROSCOPIC      Component Value Range   Color, Urine YELLOW  YELLOW    APPearance CLEAR  CLEAR    Specific Gravity, Urine 1.010  1.005 - 1.030    pH 6.0  5.0 - 8.0    Glucose, UA 500 (*) NEGATIVE (mg/dL)   Hgb urine dipstick NEGATIVE  NEGATIVE    Bilirubin Urine NEGATIVE  NEGATIVE    Ketones, ur NEGATIVE  NEGATIVE (mg/dL)   Protein, ur NEGATIVE  NEGATIVE (mg/dL)   Urobilinogen, UA 0.2  0.0 - 1.0 (mg/dL)   Nitrite NEGATIVE  NEGATIVE    Leukocytes, UA NEGATIVE  NEGATIVE   GLUCOSE, CAPILLARY      Component Value Range   Glucose-Capillary 198 (*) 70 - 99 (mg/dL)   Comment 1 Notify RN     Comment 2 Documented in Chart    HEPATIC FUNCTION PANEL      Component Value Range   Total Protein 7.4  6.0 - 8.3 (g/dL)   Albumin 3.5  3.5 - 5.2 (g/dL)   AST 15  0 - 37 (U/L)   ALT 16  0 - 35 (U/L)   Alkaline Phosphatase 117  39 - 117 (U/L)   Total Bilirubin 0.3  0.3 - 1.2 (mg/dL)   Bilirubin, Direct <0.1  0.0 - 0.3 (mg/dL)   Indirect Bilirubin NOT CALCULATED  0.3 - 0.9 (mg/dL)  LIPASE, BLOOD      Component Value Range   Lipase 29  11 - 59 (U/L)    Ct Head Wo Contrast  01/23/2012  *RADIOLOGY REPORT*  Clinical Data: [place] [facility] 2 weeks ago.  Persistent right-sided pain.  CT HEAD WITHOUT CONTRAST  Technique:   Contiguous axial images were obtained from the base of the skull through the vertex without contrast.  Comparison: 11/23/2010  Findings: The brain shows age related atrophy.  There are chronic small vessel changes within the hemispheric deep white matter.  No evidence of acute or subacute infarction, mass lesion, hemorrhage, hydrocephalus or extra-axial collection.  No fluid in the sinuses, middle ears or mastoids.  No skull fracture.  There is atherosclerotic calcification of the major vessels at the base of the brain.  IMPRESSION: No acute or reversible findings.  No traumatic finding.  Atrophy and chronic small vessel disease.  Original Report Authenticated By: Jules Schick, M.D.   2:21 PM Review of notes of telephone calls shows pt's daughter had called Dr. Joycelyn Schmid Simpson's office  today, saying she was lethargic, wouldn't get out of bed, had persistent headache.  Her head CT was negative; I advised pt and her daughter of that.  Ordered a hydrocodone-acetaminophen for her headache.  Ordered CBC, BMET and UA to check on her diabetes, look for occult UTI.   3:04 PM Another one of pt's daughters has come in.  She says that pt has not eaten for several days, has to be assisted to go to the bathroom, has been incontinent of urine.  3:50 PM Pt unable to stand for orthostatic vital signs.  Will ask [facility] to admit her.    4:12 PM Discussed with Dr. Roderic Palau, who advised to admit pt to an observation status to a med-surg bed.  He will be down to see pt.  4:24 PM  Date: 01/23/2012  Rate:66 Rhythm: normal sinus rhythm  QRS Axis: normal  Intervals: normal  ST/T Wave abnormalities: normal  Conduction Disutrbances:none  Narrative Interpretation: Normal EKG  Old EKG Reviewed: none available     1. Orthostatic hypotension   2. Hyperglycemia   3. Headache    I personally performed the services described in this documentation, which was scribed in my presence. The recorded information  has been reviewed and considered.  Laura Atkins, M.D.        Mylinda Latina III, MD 01/23/12 [place], MD 01/23/12 [place], MD 01/23/12 [phone]

## 2012-01-23 NOTE — ED Notes (Signed)
Dr. Monia Pouch aware.

## 2012-01-23 NOTE — ED Notes (Addendum)
Pt reports fell 2 weeks ago.  Reports stood up and lost balance and fell.  Pt hit head on floor.  Denies any LOC but started having headache a couple of days ago and started vomiting yesterday.  No vomiting today.  REports had x ray of her r ribs after the fall.

## 2012-01-23 NOTE — ED Notes (Signed)
Family at bedside. Patient states her head is still bothering her. RN Magda Paganini notified.

## 2012-01-23 NOTE — ED Notes (Signed)
Pt reports generalized weakness but denies any weakness more on one side or the other.  Pt's mouth symmetrical when smiles but when relaxes her face appears to have slight  r sided droop.  Daughter at bedside and thinks mouth is not different than normal.  Grips are strong and equal and pupils are equal and reactive.  Pt alert and oriented.

## 2012-01-23 NOTE — H&P (Signed)
PCP:   Tula Nakayama, MD, MD   Chief Complaint:  Vomiting, weakness  HPI: This is a 71 y/o female who presents to the emergency room with headache, dizziness, generalized weakness and vomiting. Patient's family reports that they had noticed a change in her condition approximately 4-5 days ago.  They had noticed some generalized weakness and dizziness while walking. Approximately 2 days ago, patient began to have persistent vomiting.  Her po intake has been very poor. They are not sure whether she has had a fever.  She denies any chest pain, cough, or shortness of breath.  She was evaluated in the emergency room and was felt to be orthostatic and dehydrated.  She has been referred for admission.  Allergies:   Allergies  Allergen Reactions  . Penicillins Shortness Of Breath, Itching and Rash  . Prednisone Shortness Of Breath, Itching and Rash  . Propoxyphene-Acetaminophen Itching and Nausea And Vomiting      Past Medical History  Diagnosis Date  . Hypertension   . Diabetes mellitus   . Hyperlipidemia   . Obesity   . Anemia   . Anxiety   . Depression   . GERD (gastroesophageal reflux disease)   . Pruritus   . Osteoarthritis   . Right shoulder pain   . Left knee pain   . Hypersomnia   . Urinary incontinence     Past Surgical History  Procedure Date  . Abdominal hysterectomy   . Cyst removed left breast   . Cholecystectomy     Prior to Admission medications   Medication Sig Start Date End Date Taking? Authorizing Provider  ALPRAZolam Duanne Moron) 0.5 MG tablet Take 0.25-0.5 mg by mouth 3 (three) times daily. Take one-half tablet by mouth in the morning and at noon, then take two tablets at bedtime 01/16/12  Yes Fayrene Helper, MD  amLODipine (NORVASC) 10 MG tablet Take 1 tablet (10 mg total) by mouth daily. 06/07/11  Yes Fayrene Helper, MD  aspirin 81 MG tablet Take 81 mg by mouth daily.     Yes Historical Provider, MD  atorvastatin (LIPITOR) 40 MG tablet Take 1 tablet  (40 mg total) by mouth daily. 01/26/11 01/26/12 Yes Fayrene Helper, MD  benazepril (LOTENSIN) 20 MG tablet TAKE ONE TABLET BY MOUTH EVERY DAY 08/07/11  Yes Fayrene Helper, MD  clotrimazole-betamethasone (LOTRISONE) cream Apply to affected area 2 times daily 03/21/11 03/20/12 Yes Fayrene Helper, MD  cyclobenzaprine (FLEXERIL) 10 MG tablet Take 10 mg by mouth at bedtime.     Yes Historical Provider, MD  diclofenac (VOLTAREN) 75 MG EC tablet Take 75 mg by mouth 2 (two) times daily as needed.     Yes Historical Provider, MD  furosemide (LASIX) 40 MG tablet Take 1 tablet (40 mg total) by mouth 2 (two) times daily. 06/07/11  Yes Fayrene Helper, MD  gabapentin (NEURONTIN) 300 MG capsule Take 1 capsule (300 mg total) by mouth 3 (three) times daily. 06/07/11  Yes Fayrene Helper, MD  glucose blood (ONE TOUCH ULTRA TEST) test strip Three times daily testing  DX 250.00 07/25/11  Yes Fayrene Helper, MD  HYDROcodone-acetaminophen (NORCO) 5-325 MG per tablet Take 1 tablet by mouth 2 (two) times daily as needed.     Yes Historical Provider, MD  hydrOXYzine (ATARAX) 25 MG tablet Take 1 tablet (25 mg total) by mouth at bedtime as needed. 06/07/11  Yes Fayrene Helper, MD  insulin lispro protamine-insulin lispro (HUMALOG 75/25) (75-25) 100 UNIT/ML SUSP  Inject into the skin. Per sliding scale    Yes Historical Provider, MD  ketorolac (ACULAR) 0.4 % SOLN Take 1 drop by mouth 4 times daily. 10/17/11  Yes Historical Provider, MD  Lancets [facility] ULTRASOFT) lancets Use as instructed THREE TIMES DAILY  Dx 250.00 07/30/11 07/29/12 Yes Fayrene Helper, MD  lovastatin (MEVACOR) 40 MG tablet Two tabs at bedtime 01/23/11 01/23/12 Yes Fayrene Helper, MD  multivitamin [facility]) per tablet Take 1 tablet by mouth daily.     Yes Historical Provider, MD  Naproxen-Esomeprazole (VIMOVO) 375-20 MG TBEC Take 1 tablet by mouth 2 (two) times daily. 02/21/11  Yes Fayrene Helper, MD  sertraline (ZOLOFT) 50 MG tablet  Take 1 tablet (50 mg total) by mouth daily. Three tabs daily 10/25/11  Yes Kathlee Nations, MD  UNABLE TO FIND Apply 1-2 application topically 4 (four) times daily. Med Name: Dual K (M) Apply as roll-on for 1-2 minutes to affected area(s) two to four times daily   Yes Historical Provider, MD  VICTOZA 18 MG/3ML SOLN Inject into the skin Daily. 11/16/11  Yes Historical Provider, MD  vitamin B-12 (CYANOCOBALAMIN) 500 MCG tablet Take 500 mcg by mouth daily.     Yes Historical Provider, MD  B-D INS SYR ULTRAFINE 1CC/30G 30G X 1/2" 1 ML MISC USE AS DIRECTED 01/03/12   Fayrene Helper, MD    Social History:  reports that she has never smoked. She has never used smokeless tobacco. She reports that she does not drink alcohol or use illicit drugs.  History reviewed. No pertinent family history.  Review of Systems: Positives in bold Constitutional: Denies fever, chills, diaphoresis, appetite change and fatigue.  HEENT: Denies photophobia, eye pain, redness, hearing loss, ear pain, congestion, sore throat, rhinorrhea, sneezing, mouth sores, trouble swallowing, neck pain, neck stiffness and tinnitus.   Respiratory: Denies SOB, DOE, cough, chest tightness,  and wheezing.   Cardiovascular: Denies chest pain, palpitations and leg swelling.  Gastrointestinal: Denies nausea, vomiting, abdominal pain, diarrhea, constipation, blood in stool and abdominal distention.  Genitourinary: Denies dysuria, urgency, frequency, hematuria, flank pain and difficulty urinating.  Musculoskeletal: Denies myalgias, back pain, joint swelling, arthralgias and gait problem.  Skin: Denies pallor, rash and wound.  Neurological: Denies dizziness, seizures, syncope, weakness, light-headedness, numbness and headaches.  Hematological: Denies adenopathy. Easy bruising, personal or family bleeding history  Psychiatric/Behavioral: Denies suicidal ideation, mood changes, confusion, nervousness, sleep disturbance and agitation   Physical  Exam: Blood pressure 149/50, pulse 66, temperature 99.1 F (37.3 C), temperature source Oral, resp. rate 20, height 5' 7.5" (1.715 m), weight 124.286 kg (274 lb), SpO2 98.00%. General: NAD, somewhat somnolent, able to provide history HEENT: [place], AT, PERRLA Neck: supple Chest: CTA B Cardiac: S1, S2, RRR Abd: soft, NT, BS+ Ext: no edema b/l Neuro: grossly intact, non focal  Labs on Admission:  Results for orders placed during the hospital encounter of 01/23/12 (from the past 48 hour(s))  CBC     Status: Abnormal   Collection Time   01/23/12  2:30 PM      Component Value Range Comment   WBC 6.9  4.0 - 10.5 (K/uL)    RBC 5.34 (*) 3.87 - 5.11 (MIL/uL)    Hemoglobin 11.2 (*) 12.0 - 15.0 (g/dL)    HCT 35.8 (*) 36.0 - 46.0 (%)    MCV 67.0 (*) 78.0 - 100.0 (fL)    MCH 21.0 (*) 26.0 - 34.0 (pg)    MCHC 31.3  30.0 - 36.0 (g/dL)  RDW 14.6  11.5 - 15.5 (%)    Platelets 201  150 - 400 (K/uL)   DIFFERENTIAL     Status: Normal   Collection Time   01/23/12  2:30 PM      Component Value Range Comment   Neutrophils Relative 55  43 - 77 (%)    Neutro Abs 3.8  1.7 - 7.7 (K/uL)    Lymphocytes Relative 38  12 - 46 (%)    Lymphs Abs 2.6  0.7 - 4.0 (K/uL)    Monocytes Relative 6  3 - 12 (%)    Monocytes Absolute 0.4  0.1 - 1.0 (K/uL)    Eosinophils Relative 1  0 - 5 (%)    Eosinophils Absolute 0.1  0.0 - 0.7 (K/uL)    Basophils Relative 0  0 - 1 (%)    Basophils Absolute 0.0  0.0 - 0.1 (K/uL)   BASIC METABOLIC PANEL     Status: Abnormal   Collection Time   01/23/12  2:30 PM      Component Value Range Comment   Sodium 134 (*) 135 - 145 (mEq/L)    Potassium 3.6  3.5 - 5.1 (mEq/L)    Chloride 94 (*) 96 - 112 (mEq/L)    CO2 30  19 - 32 (mEq/L)    Glucose, Bld 234 (*) 70 - 99 (mg/dL)    BUN 9  6 - 23 (mg/dL)    Creatinine, Ser 0.59  0.50 - 1.10 (mg/dL)    Calcium 9.4  8.4 - 10.5 (mg/dL)    GFR calc non Af Amer >90  >90 (mL/min)    GFR calc Af Amer >90  >90 (mL/min)   URINALYSIS, ROUTINE W REFLEX  MICROSCOPIC     Status: Abnormal   Collection Time   01/23/12  2:31 PM      Component Value Range Comment   Color, Urine YELLOW  YELLOW     APPearance CLEAR  CLEAR     Specific Gravity, Urine 1.010  1.005 - 1.030     pH 6.0  5.0 - 8.0     Glucose, UA 500 (*) NEGATIVE (mg/dL)    Hgb urine dipstick NEGATIVE  NEGATIVE     Bilirubin Urine NEGATIVE  NEGATIVE     Ketones, ur NEGATIVE  NEGATIVE (mg/dL)    Protein, ur NEGATIVE  NEGATIVE (mg/dL)    Urobilinogen, UA 0.2  0.0 - 1.0 (mg/dL)    Nitrite NEGATIVE  NEGATIVE     Leukocytes, UA NEGATIVE  NEGATIVE  MICROSCOPIC NOT DONE ON URINES WITH NEGATIVE PROTEIN, BLOOD, LEUKOCYTES, NITRITE, OR GLUCOSE <1000 mg/dL.    Radiological Exams on Admission: Ct Head Wo Contrast  01/23/2012  *RADIOLOGY REPORT*  Clinical Data: [place] 2 weeks ago.  Persistent right-sided pain.  CT HEAD WITHOUT CONTRAST  Technique:  Contiguous axial images were obtained from the base of the skull through the vertex without contrast.  Comparison: 11/23/2010  Findings: The brain shows age related atrophy.  There are chronic small vessel changes within the hemispheric deep white matter.  No evidence of acute or subacute infarction, mass lesion, hemorrhage, hydrocephalus or extra-axial collection.  No fluid in the sinuses, middle ears or mastoids.  No skull fracture.  There is atherosclerotic calcification of the major vessels at the base of the brain.  IMPRESSION: No acute or reversible findings.  No traumatic finding.  Atrophy and chronic small vessel disease.  Original Report Authenticated By: Jules Schick, M.D.   ECG: no acute ST-T  changes  Assessment/Plan Active Problems:  IDDM  HYPERLIPIDEMIA  OBESITY  Headache  Vomiting  Dehydration  Generalized weakness  Orthostatic hypotension  Plan:  1. Vomiting, possibly due to a viral gastroenteritis.  Treat supportively for now.  Clear liquids and anti emetics.  Also check hepatic function panel and lipase. 2. Orthostatic  hypotension.  Likely due to volume depletion.  Will provide hydration overnight and reassess in am 3. Dehydration, give IVF 4. IDDM.  Give sliding scale insulin for now and lantus. 5. Headache.  Treat supportively, CT head negative. 6. Generalized weakness.  Possibly due to volume depletion.  Check TSH.  PT eval.  This may also be due to medications she has been taking.  Will decrease dose of xanax, hold flexeril and monitor status  Time Spent on Admission: 18mins  Aalyiah Camberos [facility] Pager: [phone] 01/23/2012, 5:21 PM

## 2012-01-23 NOTE — ED Notes (Signed)
Patient would like something to eat at this time. RN Magda Paganini notified.

## 2012-01-23 NOTE — ED Notes (Signed)
Family at bedside. 

## 2012-01-23 NOTE — ED Notes (Signed)
Patient feels very light headed upon sitting. Dr. Monia Pouch notified that patient felt like she couldn't stand. Patient was assisted back to lying down.

## 2012-01-23 NOTE — Progress Notes (Signed)
Unable to stand pt for standing portion of orthostatic vital signs. Dr. Roderic Palau made aware. Stated was okay to retry in the morning.

## 2012-01-23 NOTE — ED Notes (Signed)
[place] out of bed 2 weeks ago, Has had a headache since then , but worse now.Duke Salvia her head on the floor.

## 2012-01-23 NOTE — Telephone Encounter (Signed)
Daughter states mother was so sick she was vomiting, won't get out of bed, was shaking, sugar was in the 300's not eating and severe headache. Advised ER.

## 2012-01-24 ENCOUNTER — Observation Stay (HOSPITAL_COMMUNITY): Payer: Medicare Other

## 2012-01-24 DIAGNOSIS — R51 Headache: Secondary | ICD-10-CM

## 2012-01-24 DIAGNOSIS — E860 Dehydration: Secondary | ICD-10-CM

## 2012-01-24 DIAGNOSIS — I951 Orthostatic hypotension: Secondary | ICD-10-CM

## 2012-01-24 DIAGNOSIS — R112 Nausea with vomiting, unspecified: Secondary | ICD-10-CM

## 2012-01-24 LAB — BASIC METABOLIC PANEL
BUN: 8 mg/dL (ref 6–23)
CO2: 29 mEq/L (ref 19–32)
Calcium: 8.9 mg/dL (ref 8.4–10.5)
Chloride: 99 mEq/L (ref 96–112)
Creatinine, Ser: 0.65 mg/dL (ref 0.50–1.10)
GFR calc Af Amer: >90 mL/min (ref >90)
GFR calc non Af Amer: 89 mL/min — ABNORMAL LOW (ref >90)
Glucose, Bld: 230 mg/dL — ABNORMAL HIGH (ref 70–99)
Potassium: 3.6 mEq/L (ref 3.5–5.1)
Sodium: 137 mEq/L (ref 135–145)

## 2012-01-24 LAB — CBC
HCT: 34.2 % — ABNORMAL LOW (ref 36.0–46.0)
Hemoglobin: 10.8 g/dL — ABNORMAL LOW (ref 12.0–15.0)
MCH: 21.2 pg — ABNORMAL LOW (ref 26.0–34.0)
MCHC: 31.6 g/dL (ref 30.0–36.0)
MCV: 67.2 fL — ABNORMAL LOW (ref 78.0–100.0)
Platelets: 201 10*3/uL (ref 150–400)
RBC: 5.09 MIL/uL (ref 3.87–5.11)
RDW: 14.7 % (ref 11.5–15.5)
WBC: 6.7 10*3/uL (ref 4.0–10.5)

## 2012-01-24 LAB — GLUCOSE, CAPILLARY
Glucose-Capillary: 206 mg/dL — ABNORMAL HIGH (ref 70–99)
Glucose-Capillary: 187 mg/dL — ABNORMAL HIGH (ref 70–99)
Glucose-Capillary: 195 mg/dL — ABNORMAL HIGH (ref 70–99)
Glucose-Capillary: 248 mg/dL — ABNORMAL HIGH (ref 70–99)

## 2012-01-24 LAB — TSH: TSH: 0.52 u[IU]/mL (ref 0.350–4.500)

## 2012-01-24 LAB — AMMONIA: Ammonia: 27 umol/L (ref 11–60)

## 2012-01-24 MED ORDER — PANTOPRAZOLE SODIUM 40 MG IV SOLR
40.0000 mg | INTRAVENOUS | Status: DC
  Administered 2012-01-24 – 2012-01-27 (×4): 40 mg via INTRAVENOUS
  Filled 2012-01-24 (×5): qty 40

## 2012-01-24 MED ORDER — MECLIZINE HCL 25 MG PO TABS
25.0000 mg | ORAL_TABLET | Freq: Three times a day (TID) | ORAL | Status: DC
  Administered 2012-01-24: 25 mg via ORAL
  Filled 2012-01-24: qty 1
  Filled 2012-01-24: qty 2
  Filled 2012-01-24 (×3): qty 1

## 2012-01-24 MED ORDER — KETOROLAC TROMETHAMINE 0.5 % OP SOLN
1.0000 [drp] | Freq: Four times a day (QID) | OPHTHALMIC | Status: DC
  Administered 2012-01-24 – 2012-01-29 (×21): 1 [drp] via OPHTHALMIC
  Filled 2012-01-24 (×2): qty 3

## 2012-01-24 MED ORDER — TRAZODONE HCL 50 MG PO TABS
50.0000 mg | ORAL_TABLET | Freq: Once | ORAL | Status: AC
  Administered 2012-01-24: 50 mg via ORAL
  Filled 2012-01-24: qty 1

## 2012-01-24 NOTE — Progress Notes (Signed)
Brief Note  Pt surrounded by family. Currently receiving a Clear Liquid diet po 50%. Hx of wt loss per nutrition screen. Pt and husband reports wt loss intentional per MD recommendations. No additional nutrition intervention at this time.   Dietitian [phone]

## 2012-01-24 NOTE — Progress Notes (Signed)
UR Chart Review Completed  

## 2012-01-24 NOTE — Care Management Note (Signed)
    Page 1 of 2   01/29/2012     12:25:48 PM   CARE MANAGEMENT NOTE 01/29/2012  Patient:  Laura Atkins,Laura Atkins   Account Number:  [id number]  Date Initiated:  01/24/2012  Documentation initiated by:  Claretha Cooper  Subjective/Objective Assessment:   Pt admitted with headache. PTA pt lived with spouse. Family at bedside denies previous [facility] assistance.     Action/Plan:   Plan to dc home.   Anticipated DC Date:  01/25/2012   Anticipated DC Plan:  [place]  CM consult      [facility] Choice  HOME HEALTH   Choice offered to / List presented to:  C-1 Patient        [place] arranged  [place] PT      Ewa Gentry.   Status of service:  Completed, signed off Medicare Important Message given?   (If response is "NO", the following Medicare IM given date fields will be blank) Date Medicare IM given:   Date Additional Medicare IM given:    Discharge Disposition:  [place]  Per UR Regulation:  Reviewed for med. necessity/level of care/duration of stay  If discussed at [place] of Stay Meetings, dates discussed:    Comments:  PCP- Tula Nakayama  01/29/12- 1200- Marvetta Gibbons RN, BSN [phone] Spoke with pt and family at bedside regarding D/C needs- PT recommending HH-PT- pt agreeable to [facility] services. MD to order HH-PT prior to discharge. List of [place] agencies given to pt and family for [facility]- per pt and family choice- they would like to use [facility] for [facility] services. Referral sent to [facility] via [facility] and spoke with [facility] with [facility]. Per pt she has a RW and cane at home. Pt uses [facility] in [place] and has transportation home. Per family- 1st contact is pt's daughter Ivin [phone]   01/24/12 Rafael Hernandez BSN CM

## 2012-01-24 NOTE — Progress Notes (Signed)
Upon my arrival to see this pt for eval, pt's daughter became enraged that I was sent to see her mother when her mother was suffering from a bad headache.  I assured the daughter that I would do nothing and would alert the RN to pt's HA...this was done. Results for orders placed during the hospital encounter of 01/23/12  CBC      Component Value Range   WBC 6.9  4.0 - 10.5 (K/uL)   RBC 5.34 (*) 3.87 - 5.11 (MIL/uL)   Hemoglobin 11.2 (*) 12.0 - 15.0 (g/dL)   HCT 35.8 (*) 36.0 - 46.0 (%)   MCV 67.0 (*) 78.0 - 100.0 (fL)   MCH 21.0 (*) 26.0 - 34.0 (pg)   MCHC 31.3  30.0 - 36.0 (g/dL)   RDW 14.6  11.5 - 15.5 (%)   Platelets 201  150 - 400 (K/uL)  DIFFERENTIAL      Component Value Range   Neutrophils Relative 55  43 - 77 (%)   Neutro Abs 3.8  1.7 - 7.7 (K/uL)   Lymphocytes Relative 38  12 - 46 (%)   Lymphs Abs 2.6  0.7 - 4.0 (K/uL)   Monocytes Relative 6  3 - 12 (%)   Monocytes Absolute 0.4  0.1 - 1.0 (K/uL)   Eosinophils Relative 1  0 - 5 (%)   Eosinophils Absolute 0.1  0.0 - 0.7 (K/uL)   Basophils Relative 0  0 - 1 (%)   Basophils Absolute 0.0  0.0 - 0.1 (K/uL)  BASIC METABOLIC PANEL      Component Value Range   Sodium 134 (*) 135 - 145 (mEq/L)   Potassium 3.6  3.5 - 5.1 (mEq/L)   Chloride 94 (*) 96 - 112 (mEq/L)   CO2 30  19 - 32 (mEq/L)   Glucose, Bld 234 (*) 70 - 99 (mg/dL)   BUN 9  6 - 23 (mg/dL)   Creatinine, Ser 0.59  0.50 - 1.10 (mg/dL)   Calcium 9.4  8.4 - 10.5 (mg/dL)   GFR calc non Af Amer >90  >90 (mL/min)   GFR calc Af Amer >90  >90 (mL/min)  URINALYSIS, ROUTINE W REFLEX MICROSCOPIC      Component Value Range   Color, Urine YELLOW  YELLOW    APPearance CLEAR  CLEAR    Specific Gravity, Urine 1.010  1.005 - 1.030    pH 6.0  5.0 - 8.0    Glucose, UA 500 (*) NEGATIVE (mg/dL)   Hgb urine dipstick NEGATIVE  NEGATIVE    Bilirubin Urine NEGATIVE  NEGATIVE    Ketones, ur NEGATIVE  NEGATIVE (mg/dL)   Protein, ur NEGATIVE  NEGATIVE (mg/dL)   Urobilinogen, UA 0.2  0.0 -  1.0 (mg/dL)   Nitrite NEGATIVE  NEGATIVE    Leukocytes, UA NEGATIVE  NEGATIVE   GLUCOSE, CAPILLARY      Component Value Range   Glucose-Capillary 198 (*) 70 - 99 (mg/dL)   Comment 1 Notify RN     Comment 2 Documented in Chart    TSH      Component Value Range   TSH 0.520  0.350 - 4.500 (uIU/mL)  HEPATIC FUNCTION PANEL      Component Value Range   Total Protein 7.4  6.0 - 8.3 (g/dL)   Albumin 3.5  3.5 - 5.2 (g/dL)   AST 15  0 - 37 (U/L)   ALT 16  0 - 35 (U/L)   Alkaline Phosphatase 117  39 - 117 (U/L)  Total Bilirubin 0.3  0.3 - 1.2 (mg/dL)   Bilirubin, Direct <0.1  0.0 - 0.3 (mg/dL)   Indirect Bilirubin NOT CALCULATED  0.3 - 0.9 (mg/dL)  LIPASE, BLOOD      Component Value Range   Lipase 29  11 - 59 (U/L)  BASIC METABOLIC PANEL      Component Value Range   Sodium 137  135 - 145 (mEq/L)   Potassium 3.6  3.5 - 5.1 (mEq/L)   Chloride 99  96 - 112 (mEq/L)   CO2 29  19 - 32 (mEq/L)   Glucose, Bld 230 (*) 70 - 99 (mg/dL)   BUN 8  6 - 23 (mg/dL)   Creatinine, Ser 0.65  0.50 - 1.10 (mg/dL)   Calcium 8.9  8.4 - 10.5 (mg/dL)   GFR calc non Af Amer 89 (*) >90 (mL/min)   GFR calc Af Amer >90  >90 (mL/min)  CBC      Component Value Range   WBC 6.7  4.0 - 10.5 (K/uL)   RBC 5.09  3.87 - 5.11 (MIL/uL)   Hemoglobin 10.8 (*) 12.0 - 15.0 (g/dL)   HCT 34.2 (*) 36.0 - 46.0 (%)   MCV 67.2 (*) 78.0 - 100.0 (fL)   MCH 21.2 (*) 26.0 - 34.0 (pg)   MCHC 31.6  30.0 - 36.0 (g/dL)   RDW 14.7  11.5 - 15.5 (%)   Platelets 201  150 - 400 (K/uL)  GLUCOSE, CAPILLARY      Component Value Range   Glucose-Capillary 258 (*) 70 - 99 (mg/dL)   Comment 1 Notify RN     Comment 2 Documented in Chart    GLUCOSE, CAPILLARY      Component Value Range   Glucose-Capillary 206 (*) 70 - 99 (mg/dL)   Comment 1 Notify RN     was done.

## 2012-01-24 NOTE — Progress Notes (Addendum)
Subjective: Patient remains lethargic, has had some confusion, had some nausea last night, but tolerated liquids for breakfast, still had some headache  Objective: Vital signs in last 24 hours: Temp:  [98 F (36.7 C)-99.6 F (37.6 C)] 98.8 F (37.1 C) (05/02 0615) Pulse Rate:  [65-80] 80  (05/02 0618) Resp:  [18-20] 20  (05/02 0618) BP: (142-172)/(49-89) 159/89 mmHg (05/02 0618) SpO2:  [94 %-100 %] 95 % (05/02 0618) Weight:  [121 kg (266 lb 12.1 oz)-124.286 kg (274 lb)] 121 kg (266 lb 12.1 oz) (05/01 1753) Weight change:  Last BM Date: 01/21/12  Intake/Output from previous day: 05/01 0701 - 05/02 0700 In: 1033.3 [I.V.:1033.3] Out: 700 [Urine:700]     Physical Exam: General: awake, answers questions, follows commands, appears lethargic, confused HEENT: No bruits, no goiter. Heart: Regular rate and rhythm, without murmurs, rubs, gallops. Lungs: Clear to auscultation bilaterally. Abdomen: Soft, nontender, nondistended, positive bowel sounds. Extremities: No clubbing cyanosis or edema with positive pedal pulses. Neuro: Grossly intact, nonfocal.    Lab Results: Basic Metabolic Panel:  Basename 01/24/12 0616 01/23/12 1430  NA 137 134*  K 3.6 3.6  CL 99 94*  CO2 29 30  GLUCOSE 230* 234*  BUN 8 9  CREATININE 0.65 0.59  CALCIUM 8.9 9.4  MG -- --  PHOS -- --   Liver Function Tests:  [facility] 01/23/12 1830  AST 15  ALT 16  ALKPHOS 117  BILITOT 0.3  PROT 7.4  ALBUMIN 3.5    Basename 01/23/12 1830  LIPASE 29  AMYLASE --   No results found for this basename: AMMONIA:2 in the last 72 hours CBC:  Basename 01/24/12 0616 01/23/12 1430  WBC 6.7 6.9  NEUTROABS -- 3.8  HGB 10.8* 11.2*  HCT 34.2* 35.8*  MCV 67.2* 67.0*  PLT 201 201   Cardiac Enzymes: No results found for this basename: CKTOTAL:3,CKMB:3,CKMBINDEX:3,TROPONINI:3 in the last 72 hours BNP: No results found for this basename: PROBNP:3 in the last 72 hours D-Dimer: No results found for this  basename: DDIMER:2 in the last 72 hours CBG:  Basename 01/24/12 0721 01/23/12 2056 01/23/12 1752  GLUCAP 206* 258* 198*   Hemoglobin A1C: No results found for this basename: HGBA1C in the last 72 hours Fasting Lipid Panel: No results found for this basename: CHOL,HDL,LDLCALC,TRIG,CHOLHDL,LDLDIRECT in the last 72 hours Thyroid Function Tests:  Basename 01/23/12 1830  TSH 0.520  T4TOTAL --  FREET4 --  T3FREE --  THYROIDAB --   Anemia Panel: No results found for this basename: VITAMINB12,FOLATE,FERRITIN,TIBC,IRON,RETICCTPCT in the last 72 hours Coagulation: No results found for this basename: LABPROT:2,INR:2 in the last 72 hours Urine Drug Screen: Drugs of Abuse  No results found for this basename: labopia, cocainscrnur, labbenz, amphetmu, thcu, labbarb    Alcohol Level: No results found for this basename: ETH:2 in the last 72 hours Urinalysis:  Basename 01/23/12 1431  COLORURINE YELLOW  LABSPEC 1.010  PHURINE 6.0  GLUCOSEU 500*  HGBUR NEGATIVE  BILIRUBINUR NEGATIVE  KETONESUR NEGATIVE  PROTEINUR NEGATIVE  UROBILINOGEN 0.2  NITRITE NEGATIVE  LEUKOCYTESUR NEGATIVE    No results found for this or any previous visit (from the past 240 hour(s)).  Studies/Results: Ct Head Wo Contrast  01/23/2012  *RADIOLOGY REPORT*  Clinical Data: [place] 2 weeks ago.  Persistent right-sided pain.  CT HEAD WITHOUT CONTRAST  Technique:  Contiguous axial images were obtained from the base of the skull through the vertex without contrast.  Comparison: 11/23/2010  Findings: The brain shows age related atrophy.  There are  chronic small vessel changes within the hemispheric deep white matter.  No evidence of acute or subacute infarction, mass lesion, hemorrhage, hydrocephalus or extra-axial collection.  No fluid in the sinuses, middle ears or mastoids.  No skull fracture.  There is atherosclerotic calcification of the major vessels at the base of the brain.  IMPRESSION: No acute or reversible findings.   No traumatic finding.  Atrophy and chronic small vessel disease.  Original Report Authenticated By: Jules Schick, M.D.    Medications: Scheduled Meds:   . amLODipine  10 mg Oral Daily  . aspirin  81 mg Oral Daily  . atorvastatin  40 mg Oral q1800  . benazepril  20 mg Oral Daily  . diclofenac  75 mg Oral BID  . enoxaparin  40 mg Subcutaneous Q24H  . gabapentin  300 mg Oral TID  . HYDROcodone-acetaminophen  1 tablet Oral Once  . insulin aspart  0-15 Units Subcutaneous TID WC  . insulin aspart  0-5 Units Subcutaneous QHS  . insulin glargine  10 Units Subcutaneous QHS  . ketorolac  1 drop Both Eyes QID  . sertraline  50 mg Oral Daily  . traZODone  50 mg Oral Once  . DISCONTD: ketorolac  1 drop Ophthalmic QID   Continuous Infusions:   . sodium chloride 100 mL/hr at 01/23/12 1800  . DISCONTD: sodium chloride 125 mL/hr at 01/23/12 1745   PRN Meds:.acetaminophen, acetaminophen, ALPRAZolam, ondansetron (ZOFRAN) IV, ondansetron  Assessment/Plan:  Active Problems:  IDDM  HYPERLIPIDEMIA  OBESITY  Headache  Vomiting  Dehydration  Generalized weakness  Orthostatic hypotension  Plan:  1. Encephalopathy.  The etiology of her lethargy/confusion is not entirely clear. We will discontinue xanax and neurontin completely. Will order MRI of brain.  Urine culture is pending, although her UA was negative and she is afebrile.   2. Vomiting.  Lipase and liver function tests normal.  Check abd ultrasound and start protonix  3. Dehydration, improved  4. Generalized weakness. Physical therapy to see  5. Diabetes. Stable, continue to follow blood sugars.    LOS: 1 day   Zaire Levesque [facility] Pager: [phone] 01/24/2012, 9:39 AM   1115-Spoke with daughter at bedside regarding plan of care.  Patient's daughter is very upset and frustrated with the lack of improvement in her mother's condition.  She was also very upset that physical therapy was asked to see the patient, when  the patient had a significant headache. She is requesting that patient be transferred to [facility].  At this time, I do not feel that medically she requires transfer to [facility]. I explained to the daughter the plans to get an MRI of the brain and further work up.  She agrees to wait for results of MRI.  1700- Updated patient/family at bedside regarding results of MRI and ultrasound done today.  Also called and spoke to daughter Benjamine Mola [phone] to update her.  Patient remains lethargic and confused.  Will order ammonia, RPR, b12, follow up urine culture, get EEG and neurology consult.  [id number]- Was informed by staff that the patient's family is insisting on transfer to [facility].  I have discussed the case with Dr. Hartford Poli who has kindly accepted the patient in transfer

## 2012-01-25 ENCOUNTER — Inpatient Hospital Stay (HOSPITAL_COMMUNITY): Payer: Medicare Other

## 2012-01-25 ENCOUNTER — Observation Stay (HOSPITAL_COMMUNITY): Payer: Medicare Other

## 2012-01-25 ENCOUNTER — Other Ambulatory Visit (HOSPITAL_COMMUNITY): Payer: Medicare Other

## 2012-01-25 ENCOUNTER — Inpatient Hospital Stay (HOSPITAL_COMMUNITY): Payer: Medicare Other | Admitting: Anesthesiology

## 2012-01-25 ENCOUNTER — Telehealth: Payer: Self-pay | Admitting: Family Medicine

## 2012-01-25 ENCOUNTER — Encounter (HOSPITAL_COMMUNITY): Payer: Self-pay | Admitting: Anesthesiology

## 2012-01-25 DIAGNOSIS — R51 Headache: Secondary | ICD-10-CM

## 2012-01-25 DIAGNOSIS — R112 Nausea with vomiting, unspecified: Secondary | ICD-10-CM

## 2012-01-25 DIAGNOSIS — R4182 Altered mental status, unspecified: Secondary | ICD-10-CM | POA: Diagnosis present

## 2012-01-25 DIAGNOSIS — Z9911 Dependence on respirator [ventilator] status: Secondary | ICD-10-CM

## 2012-01-25 DIAGNOSIS — R569 Unspecified convulsions: Secondary | ICD-10-CM

## 2012-01-25 DIAGNOSIS — E860 Dehydration: Secondary | ICD-10-CM

## 2012-01-25 DIAGNOSIS — I951 Orthostatic hypotension: Secondary | ICD-10-CM

## 2012-01-25 DIAGNOSIS — J9601 Acute respiratory failure with hypoxia: Secondary | ICD-10-CM | POA: Diagnosis present

## 2012-01-25 DIAGNOSIS — R197 Diarrhea, unspecified: Secondary | ICD-10-CM

## 2012-01-25 DIAGNOSIS — J9600 Acute respiratory failure, unspecified whether with hypoxia or hypercapnia: Secondary | ICD-10-CM

## 2012-01-25 LAB — URINALYSIS, ROUTINE W REFLEX MICROSCOPIC
Bilirubin Urine: NEGATIVE
Glucose, UA: >1000 mg/dL — AB
Ketones, ur: 15 mg/dL — AB
Leukocytes, UA: NEGATIVE
Nitrite: NEGATIVE
Protein, ur: 30 mg/dL — AB
Specific Gravity, Urine: 1.025 (ref 1.005–1.030)
Urobilinogen, UA: 1 mg/dL (ref 0.0–1.0)
pH: 5.5 (ref 5.0–8.0)

## 2012-01-25 LAB — CBC
HCT: 37.3 % (ref 36.0–46.0)
Hemoglobin: 11.9 g/dL — ABNORMAL LOW (ref 12.0–15.0)
MCH: 21.4 pg — ABNORMAL LOW (ref 26.0–34.0)
MCHC: 31.9 g/dL (ref 30.0–36.0)
MCV: 67 fL — ABNORMAL LOW (ref 78.0–100.0)
Platelets: 319 10*3/uL (ref 150–400)
RBC: 5.57 MIL/uL — ABNORMAL HIGH (ref 3.87–5.11)
RDW: 14.6 % (ref 11.5–15.5)
WBC: 19.3 10*3/uL — ABNORMAL HIGH (ref 4.0–10.5)

## 2012-01-25 LAB — RPR: RPR Ser Ql: NONREACTIVE

## 2012-01-25 LAB — COMPREHENSIVE METABOLIC PANEL
ALT: 19 U/L (ref 0–35)
AST: 28 U/L (ref 0–37)
Albumin: 3.8 g/dL (ref 3.5–5.2)
Alkaline Phosphatase: 122 U/L — ABNORMAL HIGH (ref 39–117)
BUN: 11 mg/dL (ref 6–23)
CO2: 19 mEq/L (ref 19–32)
Calcium: 9.1 mg/dL (ref 8.4–10.5)
Chloride: 93 mEq/L — ABNORMAL LOW (ref 96–112)
Creatinine, Ser: 0.61 mg/dL (ref 0.50–1.10)
GFR calc Af Amer: >90 mL/min (ref >90)
GFR calc non Af Amer: >90 mL/min (ref >90)
Glucose, Bld: 233 mg/dL — ABNORMAL HIGH (ref 70–99)
Potassium: 3.7 mEq/L (ref 3.5–5.1)
Sodium: 134 mEq/L — ABNORMAL LOW (ref 135–145)
Total Bilirubin: 0.5 mg/dL (ref 0.3–1.2)
Total Protein: 7.9 g/dL (ref 6.0–8.3)

## 2012-01-25 LAB — URINE CULTURE: Culture  Setup Time: 201305020148

## 2012-01-25 LAB — URINE MICROSCOPIC-ADD ON

## 2012-01-25 LAB — GLUCOSE, CAPILLARY
Glucose-Capillary: 188 mg/dL — ABNORMAL HIGH (ref 70–99)
Glucose-Capillary: 214 mg/dL — ABNORMAL HIGH (ref 70–99)
Glucose-Capillary: 245 mg/dL — ABNORMAL HIGH (ref 70–99)
Glucose-Capillary: 245 mg/dL — ABNORMAL HIGH (ref 70–99)
Glucose-Capillary: 256 mg/dL — ABNORMAL HIGH (ref 70–99)
Glucose-Capillary: 284 mg/dL — ABNORMAL HIGH (ref 70–99)

## 2012-01-25 LAB — PROCALCITONIN: Procalcitonin: <0.1 ng/mL

## 2012-01-25 LAB — VITAMIN B12: Vitamin B-12: 398 pg/mL (ref 211–911)

## 2012-01-25 LAB — AMMONIA: Ammonia: 31 umol/L (ref 11–60)

## 2012-01-25 LAB — STREP PNEUMONIAE URINARY ANTIGEN: Strep Pneumo Urinary Antigen: NEGATIVE

## 2012-01-25 LAB — MRSA PCR SCREENING: MRSA by PCR: NEGATIVE

## 2012-01-25 LAB — LACTATE DEHYDROGENASE: LDH: 298 U/L — ABNORMAL HIGH (ref 94–250)

## 2012-01-25 LAB — APTT: aPTT: 28 seconds (ref 24–37)

## 2012-01-25 LAB — LACTIC ACID, PLASMA: Lactic Acid, Venous: 2.3 mmol/L — ABNORMAL HIGH (ref 0.5–2.2)

## 2012-01-25 LAB — MAGNESIUM: Magnesium: 1.6 mg/dL (ref 1.5–2.5)

## 2012-01-25 MED ORDER — CHLORHEXIDINE GLUCONATE 0.12 % MT SOLN
15.0000 mL | Freq: Two times a day (BID) | OROMUCOSAL | Status: DC
  Administered 2012-01-25 – 2012-01-26 (×3): 15 mL via OROMUCOSAL
  Filled 2012-01-25 (×4): qty 15

## 2012-01-25 MED ORDER — ACETAMINOPHEN 650 MG RE SUPP
650.0000 mg | RECTAL | Status: DC | PRN
  Administered 2012-01-25: 650 mg via RECTAL
  Filled 2012-01-25: qty 1

## 2012-01-25 MED ORDER — INSULIN ASPART 100 UNIT/ML ~~LOC~~ SOLN: 0.0000 [IU] | SUBCUTANEOUS | Status: DC

## 2012-01-25 MED ORDER — FENTANYL CITRATE 0.05 MG/ML IJ SOLN
25.0000 ug | INTRAMUSCULAR | Status: DC | PRN
  Filled 2012-01-25: qty 2

## 2012-01-25 MED ORDER — ACETAMINOPHEN 325 MG PO TABS
650.0000 mg | ORAL_TABLET | ORAL | Status: DC | PRN
  Administered 2012-01-27 – 2012-01-28 (×2): 650 mg via ORAL
  Filled 2012-01-25 (×2): qty 2

## 2012-01-25 MED ORDER — ACETAMINOPHEN 650 MG RE SUPP: 650.0000 mg | RECTAL | Status: DC | PRN

## 2012-01-25 MED ORDER — INSULIN ASPART 100 UNIT/ML ~~LOC~~ SOLN
0.0000 [IU] | SUBCUTANEOUS | Status: DC
  Administered 2012-01-25: 3 [IU] via SUBCUTANEOUS
  Administered 2012-01-25: 5 [IU] via SUBCUTANEOUS
  Administered 2012-01-26: 3 [IU] via SUBCUTANEOUS
  Administered 2012-01-26: 2 [IU] via SUBCUTANEOUS
  Administered 2012-01-26 (×2): 3 [IU] via SUBCUTANEOUS
  Administered 2012-01-26: 8 [IU] via SUBCUTANEOUS
  Administered 2012-01-27 (×4): 5 [IU] via SUBCUTANEOUS
  Administered 2012-01-27 – 2012-01-28 (×4): 3 [IU] via SUBCUTANEOUS
  Administered 2012-01-28: 5 [IU] via SUBCUTANEOUS
  Administered 2012-01-28 (×2): 3 [IU] via SUBCUTANEOUS

## 2012-01-25 MED ORDER — SULFAMETHOXAZOLE-TRIMETHOPRIM 400-80 MG/5ML IV SOLN
5.0000 mg/kg | Freq: Three times a day (TID) | INTRAVENOUS | Status: DC
  Administered 2012-01-25 – 2012-01-27 (×6): 449.6 mg via INTRAVENOUS
  Filled 2012-01-25 (×11): qty 28.1

## 2012-01-25 MED ORDER — MIDAZOLAM HCL 2 MG/2ML IJ SOLN
1.0000 mg | INTRAMUSCULAR | Status: DC | PRN
  Filled 2012-01-25: qty 2

## 2012-01-25 MED ORDER — SODIUM CHLORIDE 0.9 % IV SOLN
INTRAVENOUS | Status: DC
  Administered 2012-01-25 – 2012-01-27 (×2): via INTRAVENOUS

## 2012-01-25 MED ORDER — SUCCINYLCHOLINE CHLORIDE 20 MG/ML IJ SOLN
INTRAMUSCULAR | Status: DC | PRN
  Administered 2012-01-25: 100 mg via INTRAVENOUS

## 2012-01-25 MED ORDER — VANCOMYCIN HCL 1000 MG IV SOLR
1250.0000 mg | Freq: Two times a day (BID) | INTRAVENOUS | Status: DC
  Administered 2012-01-26 – 2012-01-27 (×3): 1250 mg via INTRAVENOUS
  Filled 2012-01-25 (×4): qty 1250

## 2012-01-25 MED ORDER — ACETAMINOPHEN 325 MG PO TABS: 650.0000 mg | ORAL_TABLET | Freq: Four times a day (QID) | ORAL | Status: DC | PRN

## 2012-01-25 MED ORDER — MOXIFLOXACIN HCL IN NACL 400 MG/250ML IV SOLN
400.0000 mg | INTRAVENOUS | Status: DC
  Administered 2012-01-25 – 2012-01-27 (×3): 400 mg via INTRAVENOUS
  Filled 2012-01-25 (×4): qty 250

## 2012-01-25 MED ORDER — PROPOFOL 10 MG/ML IV BOLUS
INTRAVENOUS | Status: DC | PRN
  Administered 2012-01-25: 50 mg via INTRAVENOUS

## 2012-01-25 MED ORDER — SODIUM CHLORIDE 0.9 % IV SOLN
1000.0000 mg | Freq: Once | INTRAVENOUS | Status: AC
  Administered 2012-01-25: 1000 mg via INTRAVENOUS
  Filled 2012-01-25: qty 10

## 2012-01-25 MED ORDER — SODIUM CHLORIDE 0.9 % IV SOLN
500.0000 mg | Freq: Two times a day (BID) | INTRAVENOUS | Status: DC
  Administered 2012-01-26 – 2012-01-28 (×5): 500 mg via INTRAVENOUS
  Filled 2012-01-25 (×6): qty 5

## 2012-01-25 MED ORDER — SODIUM CHLORIDE 0.9 % IV SOLN
INTRAVENOUS | Status: DC
  Filled 2012-01-25 (×2): qty 1

## 2012-01-25 MED ORDER — LORAZEPAM 2 MG/ML IJ SOLN
2.0000 mg | INTRAMUSCULAR | Status: DC | PRN
  Administered 2012-01-25: 2 mg via INTRAVENOUS
  Filled 2012-01-25: qty 1

## 2012-01-25 MED ORDER — DEXTROSE 10 % IV SOLN: INTRAVENOUS | Status: DC

## 2012-01-25 MED ORDER — SODIUM CHLORIDE 0.9 % IV SOLN: 20.0000 mg/kg/d | Freq: Two times a day (BID) | INTRAVENOUS | Status: DC

## 2012-01-25 MED ORDER — VANCOMYCIN HCL 1000 MG IV SOLR
2000.0000 mg | Freq: Once | INTRAVENOUS | Status: AC
  Administered 2012-01-25: 2000 mg via INTRAVENOUS
  Filled 2012-01-25: qty 2000

## 2012-01-25 NOTE — Progress Notes (Signed)
ANTIBIOTIC CONSULT NOTE - INITIAL  Pharmacy Consult for Vancomycin  Indication: suspected meningitis  Allergies  Allergen Reactions  . Penicillins Shortness Of Breath, Itching and Rash  . Prednisone Shortness Of Breath, Itching and Rash  . Propoxyphene-Acetaminophen Itching and Nausea And Vomiting    Patient Measurements: Height: 5\' 9"  (175.3 cm) Weight: 276 lb 14.4 oz (125.6 kg) IBW/kg (Calculated) : 66.2   Vital Signs: Temp: 101 F (38.3 C) (05/03 1954) Temp src: Oral (05/03 1954) BP: 142/50 mmHg (05/03 2200) Pulse Rate: 77  (05/03 2200) Intake/Output from previous day: 05/02 0701 - 05/03 0700 In: 2796.7 [P.O.:120; I.V.:2666.7; IV Piggyback:10] Out: 600 [Urine:600] Intake/Output from this shift: Total I/O In: 269.2 [I.V.:159.2; IV Piggyback:110] Out: 260 [Urine:260]  Labs:  [facility] 01/25/12 1541 01/24/12 0616 01/23/12 1430  WBC 19.3* 6.7 6.9  HGB 11.9* 10.8* 11.2*  PLT 319 201 201  LABCREA -- -- --  CREATININE 0.61 0.65 0.59   Estimated Creatinine Clearance: 95.6 ml/min (by C-G formula based on Cr of 0.61). No results found for this basename: VANCOTROUGH:2,VANCOPEAK:2,VANCORANDOM:2,GENTTROUGH:2,GENTPEAK:2,GENTRANDOM:2,TOBRATROUGH:2,TOBRAPEAK:2,TOBRARND:2,AMIKACINPEAK:2,AMIKACINTROU:2,AMIKACIN:2, in the last 72 hours   Microbiology: Recent Results (from the past 720 hour(s))  URINE CULTURE     Status: Normal   Collection Time   01/23/12  2:31 PM      Component Value Range Status Comment   Specimen Description URINE, CLEAN CATCH   Final    Special Requests NONE   Final    Culture  Setup Time [id number]   Final    Colony Count 20,OOO COLONIES/ML   Final    Culture     Final    Value: Multiple bacterial morphotypes present, none predominant. Suggest appropriate recollection if clinically indicated.   Report Status 01/25/2012 FINAL   Final   MRSA PCR SCREENING     Status: Normal   Collection Time   01/25/12  4:45 PM      Component Value Range Status Comment   MRSA by PCR NEGATIVE  NEGATIVE  Final     Medical History: Past Medical History  Diagnosis Date  . Hypertension   . Diabetes mellitus   . Hyperlipidemia   . Obesity   . Anemia   . Anxiety   . Depression   . GERD (gastroesophageal reflux disease)   . Pruritus   . Osteoarthritis   . Right shoulder pain   . Left knee pain   . Hypersomnia   . Urinary incontinence     Medications:  Scheduled:    . chlorhexidine  15 mL Mouth/Throat BID  . enoxaparin  40 mg Subcutaneous Q24H  . insulin aspart  0-15 Units Subcutaneous Q4H  . ketorolac  1 drop Both Eyes QID  . levetiracetam  1,000 mg Intravenous Once  . levetiracetam  500 mg Intravenous Q12H  . moxifloxacin  400 mg Intravenous Q24H  . pantoprazole (PROTONIX) IV  40 mg Intravenous Q24H  . sulfamethoxazole-trimethoprim  5 mg/kg of trimethoprim (Adjusted) Intravenous Q8H  . vancomycin  20 mg/kg/day Intravenous Q12H  . DISCONTD: amLODipine  10 mg Oral Daily  . DISCONTD: aspirin  81 mg Oral Daily  . DISCONTD: atorvastatin  40 mg Oral q1800  . DISCONTD: benazepril  20 mg Oral Daily  . DISCONTD: insulin aspart  0-15 Units Subcutaneous TID WC  . DISCONTD: insulin aspart  0-4 Units Subcutaneous Q4H  . DISCONTD: insulin aspart  0-5 Units Subcutaneous QHS  . DISCONTD: insulin aspart  0-7 Units Subcutaneous Q4H  . DISCONTD: insulin glargine  10 Units Subcutaneous QHS  .  DISCONTD: meclizine  25 mg Oral TID  . DISCONTD: sertraline  50 mg Oral Daily   Assessment: 31 YOF had new onset seizure this PM, with fever (Tm 103.5) and leukocytosis (19.3), suspected for meningitis to start vancomycin. Scr 0.61, est. crcl 61ml/min. Also started avelox and bactrim.  Cultures:  5/1 UC>>>20k multiple colonies  5/1 UA>>>neg  5/2 RPR>>>non reactive  5/3 BCx2>>>  5/3 Sputum>>>  5/3 Urine>>>  Goal of Therapy:  Vancomycin trough level 15-20 mcg/ml  Plan:  - Vancomycin 2000mg  Load, then 1250mg  IV Q 12hrs - F/u renal function and cultures - Check  vancomycin trough level at steady state if indicated  Maryanna Shape, PharmD, BCPS  Clinical Pharmacist  Pager: [phone]  01/25/2012,10:32 PM

## 2012-01-25 NOTE — Progress Notes (Signed)
Patient ID: Laura Atkins, female   DOB: 03-May-1941, 71 y.o.   MRN: [id number] PATIENT DETAILS Name: Laura Atkins Age: 71 y.o. Sex: female Date of Birth: 04/29/1941 Admit Date: 01/23/2012 [id number] Moshe Cipro, MD, MD    Interim History: This am the patient was calm and stable.  However she was sleepy / lethargic / and unable to communicate.  At approximately 11:00 this morning she projectile vomited.  At approximately 3:30 pm this afternoon the patient began to convulse.  He breathing became very labored.  Rapid response and critical care were called.  The patient was intubated and transferred to ICU.    Recent history - the patient had vomiting and diarrhea Mon/Tues/Wednesday.  Was admitted to [facility] on Thursday and the family requested she be transferred to [facility] and have a neurology consult.  MRI brain was negative for acute stroke or changes.  Neurology saw the patient earlier today.  Subjective: No able to communicate.  Objective: Weight change: 1.314 kg (2 lb 14.4 oz)  Intake/Output Summary (Last 24 hours) at 01/25/12 0742 Last data filed at 01/25/12 0700  Gross per 24 hour  Intake 2796.67 ml  Output    600 ml  Net 2196.67 ml   Blood pressure 126/84, pulse 80, temperature 99.6 F (37.6 C), temperature source Oral, resp. rate 16, height 5\' 9"  (1.753 m), weight 125.6 kg (276 lb 14.4 oz), SpO2 99.00%. Filed Vitals:   01/24/12 0618 01/24/12 2040 01/24/12 2100 01/25/12 0500  BP: 159/89 177/86 163/82 126/84  Pulse: 80 75 71 80  Temp:  98.4 F (36.9 C) 98 F (36.7 C) 99.6 F (37.6 C)  TempSrc:  Oral Oral Oral  Resp: 20 24 20 16   Height:   5\' 9"  (1.753 m)   Weight:   125.6 kg (276 lb 14.4 oz)   SpO2: 95% 92% 100% 99%    Physical Exam: General:  Calm after intubation. Lungs: Clear to auscultation bilaterally without wheezes or crackles Cardiovascular: Regular rate and rhythm without murmur gallop or rub normal S1 and S2 Abdomen: Obese, Nontender, nondistended, soft,  bowel sounds positive, no rebound, no ascites, no appreciable mass Extremities: No significant cyanosis, clubbing, or edema bilateral lower extremities Neuro:  Patient speaking unintelligibly, unable to follow commands, can't recognize her family.  Basic Metabolic Panel:  Lab [id number] 0616 01/23/12 1430  NA 137 134*  K 3.6 3.6  CL 99 94*  CO2 29 30  GLUCOSE 230* 234*  BUN 8 9  CREATININE 0.65 0.59  CALCIUM 8.9 9.4  MG -- --  PHOS -- --   Liver Function Tests:  Lab 01/23/12 1830  AST 15  ALT 16  ALKPHOS 117  BILITOT 0.3  PROT 7.4  ALBUMIN 3.5    Lab 01/23/12 1830  LIPASE 29  AMYLASE --    Lab 01/24/12 1751  AMMONIA 27   CBC:  Lab 01/24/12 0616 01/23/12 1430  WBC 6.7 6.9  NEUTROABS -- 3.8  HGB 10.8* 11.2*  HCT 34.2* 35.8*  MCV 67.2* 67.0*  PLT 201 201   CBG:  Lab 01/24/12 2240 01/24/12 1626 01/24/12 1127 01/24/12 0721 01/23/12 2056 01/23/12 1752  GLUCAP 248* 195* 187* 206* 258* 198*   Thyroid Function Tests:  Lab 01/23/12 1830  TSH 0.520  T4TOTAL --  FREET4 --  T3FREE --  THYROIDAB --   Anemia Panel:  Lab 01/24/12 1754  VITAMINB12 398  FOLATE --  FERRITIN --  TIBC --  IRON --  RETICCTPCT --  Micro Results: Recent Results (from the past 240 hour(s))  URINE CULTURE     Status: Normal   Collection Time   01/23/12  2:31 PM      Component Value Range Status Comment   Specimen Description URINE, CLEAN CATCH   Final    Special Requests NONE   Final    Culture  Setup Time [id number]   Final    Colony Count 20,OOO COLONIES/ML   Final    Culture     Final    Value: Multiple bacterial morphotypes present, none predominant. Suggest appropriate recollection if clinically indicated.   Report Status 01/25/2012 FINAL   Final     Studies/Results:  MR Brain IMPRESSION:  No acute infarct.  Moderate small vessel disease type changes.  Global atrophy without hydrocephalus. .Tortuous vertebral arteries and basilar artery.    Scheduled Meds:    . amLODipine  10 mg Oral Daily  . aspirin  81 mg Oral Daily  . atorvastatin  40 mg Oral q1800  . benazepril  20 mg Oral Daily  . enoxaparin  40 mg Subcutaneous Q24H  . insulin aspart  0-15 Units Subcutaneous TID WC  . insulin aspart  0-5 Units Subcutaneous QHS  . insulin glargine  10 Units Subcutaneous QHS  . ketorolac  1 drop Both Eyes QID  . meclizine  25 mg Oral TID  . pantoprazole (PROTONIX) IV  40 mg Intravenous Q24H  . sertraline  50 mg Oral Daily  . DISCONTD: diclofenac  75 mg Oral BID  . DISCONTD: gabapentin  300 mg Oral TID  . DISCONTD: ketorolac  1 drop Ophthalmic QID   Continuous Infusions:   . sodium chloride 100 mL/hr at 01/24/12 2258   PRN Meds:.acetaminophen, acetaminophen, ondansetron (ZOFRAN) IV, ondansetron, DISCONTD: ALPRAZolam  Anti-infectives:  Anti-infectives    None      Assessment/Plan: Active Problems:  IDDM  HYPERLIPIDEMIA  OBESITY  Headache  Vomiting  Dehydration  Generalized weakness  Orthostatic hypotension    1. Seizure with low grade fever.  Patient now intubated and being transferred to ICU.  Have ordered blood cultures, procalcitonin, LDH and a chest xray.  2.  Altered mental status - patient has some degree of base line dementia, but is significantly altered - speaking to people who aren't present in the room.  The patient received some pain medication prior to admission and has been altered since.   2. Orthostatic hypotension. - resolved with IVF.  3. Projectile vomiting - possible gastro enteritis.  4. IDDM. Give sliding scale insulin for now and lantus.   5. Headache. Treat supportively, CT head negative.   6. Generalized weakness. Possibly due to volume depletion. Check TSH. PT eval. This may also be due to medications she has been taking. Will decrease dose of xanax, hold flexeril and monitor status  DVT Prophylaxis   LOS: 2 days      Melton Alar 01/25/2012, 7:42 AM [phone]

## 2012-01-25 NOTE — Telephone Encounter (Signed)
I cannot fax a med list to a room number, if they are at [facility] they would already have her medlist in [employer]

## 2012-01-25 NOTE — Consult Note (Signed)
[facility] CONSULT NOTE     Reason for Consult: AMS    HPI:    Laura Atkins is an 71 y.o. female with history of dementia per family. She was brought to hospital after 4 day history of N/V/D and poor oral intake. In ED she was found to have waxing and waning mentation with somnolence. While in hospital glucose has been elevated in mid 200's. Since hospitalized all sedating medications have been stopped.  Patient has remained Afebrile until today --she now has a low grade temperature 100.4.  Past Medical History  Diagnosis Date  . Hypertension   . Diabetes mellitus   . Hyperlipidemia   . Obesity   . Anemia   . Anxiety   . Depression   . GERD (gastroesophageal reflux disease)   . Pruritus   . Osteoarthritis   . Right shoulder pain   . Left knee pain   . Hypersomnia   . Urinary incontinence     Past Surgical History  Procedure Date  . Abdominal hysterectomy   . Cyst removed left breast   . Cholecystectomy     History reviewed. No pertinent family history.  Social History:  reports that she has never smoked. She has never used smokeless tobacco. She reports that she does not drink alcohol or use illicit drugs.  Allergies  Allergen Reactions  . Penicillins Shortness Of Breath, Itching and Rash  . Prednisone Shortness Of Breath, Itching and Rash  . Propoxyphene-Acetaminophen Itching and Nausea And Vomiting    Medications:    Prior to Admission:  Prescriptions prior to admission  Medication Sig Dispense Refill  . ALPRAZolam (XANAX) 0.5 MG tablet Take 0.25-0.5 mg by mouth 3 (three) times daily. Take one-half tablet by mouth in the morning and at noon, then take two tablets at bedtime      . amLODipine (NORVASC) 10 MG tablet Take 1 tablet (10 mg total) by mouth daily.  30 tablet  5  . aspirin 81 MG tablet Take 81 mg by mouth daily.        Marland Kitchen atorvastatin (LIPITOR) 40 MG tablet Take 1 tablet (40 mg total) by mouth daily.  30 tablet  11   . benazepril (LOTENSIN) 20 MG tablet TAKE ONE TABLET BY MOUTH EVERY DAY  90 tablet  2  . clotrimazole-betamethasone (LOTRISONE) cream Apply to affected area 2 times daily  45 g  1  . cyclobenzaprine (FLEXERIL) 10 MG tablet Take 10 mg by mouth at bedtime.        . diclofenac (VOLTAREN) 75 MG EC tablet Take 75 mg by mouth 2 (two) times daily as needed.        . furosemide (LASIX) 40 MG tablet Take 1 tablet (40 mg total) by mouth 2 (two) times daily.  45 tablet  5  . gabapentin (NEURONTIN) 300 MG capsule Take 1 capsule (300 mg total) by mouth 3 (three) times daily.  90 capsule  5  . glucose blood (ONE TOUCH ULTRA TEST) test strip Three times daily testing  DX 250.00  100 each  11  . HYDROcodone-acetaminophen (NORCO) 5-325 MG per tablet Take 1 tablet by mouth 2 (two) times daily as needed.        . hydrOXYzine (ATARAX) 25 MG tablet Take 1 tablet (25 mg total) by mouth at bedtime as needed.  30 tablet  5  .  insulin lispro protamine-insulin lispro (HUMALOG 75/25) (75-25) 100 UNIT/ML SUSP Inject into the skin. Per sliding scale       . ketorolac (ACULAR) 0.4 % SOLN Take 1 drop by mouth 4 times daily.      . Lancets (ONETOUCH ULTRASOFT) lancets Use as instructed THREE TIMES DAILY  Dx 250.00  100 each  12  . lovastatin (MEVACOR) 40 MG tablet Two tabs at bedtime  60 tablet  0  . multivitamin (THERAGRAN) per tablet Take 1 tablet by mouth daily.        . Naproxen-Esomeprazole (VIMOVO) 375-20 MG TBEC Take 1 tablet by mouth 2 (two) times daily.  60 tablet  1  . sertraline (ZOLOFT) 50 MG tablet Take 1 tablet (50 mg total) by mouth daily. Three tabs daily  90 tablet  2  . UNABLE TO FIND Apply 1-2 application topically 4 (four) times daily. Med Name: Dual K (M) Apply as roll-on for 1-2 minutes to affected area(s) two to four times daily      . VICTOZA 18 MG/3ML SOLN Inject into the skin Daily.      . vitamin B-12 (CYANOCOBALAMIN) 500 MCG tablet Take 500 mcg by mouth daily.        . B-D INS SYR ULTRAFINE  1CC/30G 30G X 1/2" 1 ML MISC USE AS DIRECTED  100 each  2   Scheduled:   . amLODipine  10 mg Oral Daily  . aspirin  81 mg Oral Daily  . atorvastatin  40 mg Oral q1800  . benazepril  20 mg Oral Daily  . enoxaparin  40 mg Subcutaneous Q24H  . insulin aspart  0-15 Units Subcutaneous TID WC  . insulin aspart  0-5 Units Subcutaneous QHS  . insulin glargine  10 Units Subcutaneous QHS  . ketorolac  1 drop Both Eyes QID  . meclizine  25 mg Oral TID  . pantoprazole (PROTONIX) IV  40 mg Intravenous Q24H  . sertraline  50 mg Oral Daily  . DISCONTD: diclofenac  75 mg Oral BID    Review of Systems - unable to attain as patient will not answer questions  Blood pressure 153/73, pulse 72, temperature 100.4 F (38 C), temperature source Oral, resp. rate 20, height 5\' 9"  (1.753 m), weight 125.6 kg (276 lb 14.4 oz), SpO2 100.00%.   Neurologic Examination:   Mental Status: Alert, continues to want to look out the window, will not follow my commands, will track my movements Cranial Nerves: II-Visual fields grossly intact --blinks to threat bilaterally. III/IV/VI-Extraocular movements intact --will track my movements across the room and look around the room.   Pupils reactive bilaterally. V/VII-Smile symmetric VIII-grossly intact--winces to pain IX/X-normal gag XI-bilateral shoulder shrug XII-midline tongue extension Motor: moving all extremities antigravity and spontaneously.  Withdraws to pain briskly all 4 extremities.  Sensory: Pinprick and light touch intact throughout, bilaterally Deep Tendon Reflexes: depressed throughout Plantars: Mute bilaterally   Lab Results  Component Value Date/Time   CHOL 172 09/21/2011 10:45 AM    Results for orders placed during the hospital encounter of 01/23/12 (from the past 48 hour(s))  CBC     Status: Abnormal   Collection Time   01/23/12  2:30 PM      Component Value Range Comment   WBC 6.9  4.0 - 10.5 (K/uL)    RBC 5.34 (*) 3.87 - 5.11 (MIL/uL)      Hemoglobin 11.2 (*) 12.0 - 15.0 (g/dL)    HCT 35.8 (*) 36.0 - 46.0 (%)  MCV 67.0 (*) 78.0 - 100.0 (fL)    MCH 21.0 (*) 26.0 - 34.0 (pg)    MCHC 31.3  30.0 - 36.0 (g/dL)    RDW 14.6  11.5 - 15.5 (%)    Platelets 201  150 - 400 (K/uL)   DIFFERENTIAL     Status: Normal   Collection Time   01/23/12  2:30 PM      Component Value Range Comment   Neutrophils Relative 55  43 - 77 (%)    Neutro Abs 3.8  1.7 - 7.7 (K/uL)    Lymphocytes Relative 38  12 - 46 (%)    Lymphs Abs 2.6  0.7 - 4.0 (K/uL)    Monocytes Relative 6  3 - 12 (%)    Monocytes Absolute 0.4  0.1 - 1.0 (K/uL)    Eosinophils Relative 1  0 - 5 (%)    Eosinophils Absolute 0.1  0.0 - 0.7 (K/uL)    Basophils Relative 0  0 - 1 (%)    Basophils Absolute 0.0  0.0 - 0.1 (K/uL)   BASIC METABOLIC PANEL     Status: Abnormal   Collection Time   01/23/12  2:30 PM      Component Value Range Comment   Sodium 134 (*) 135 - 145 (mEq/L)    Potassium 3.6  3.5 - 5.1 (mEq/L)    Chloride 94 (*) 96 - 112 (mEq/L)    CO2 30  19 - 32 (mEq/L)    Glucose, Bld 234 (*) 70 - 99 (mg/dL)    BUN 9  6 - 23 (mg/dL)    Creatinine, Ser 0.59  0.50 - 1.10 (mg/dL)    Calcium 9.4  8.4 - 10.5 (mg/dL)    GFR calc non Af Amer >90  >90 (mL/min)    GFR calc Af Amer >90  >90 (mL/min)   URINALYSIS, ROUTINE W REFLEX MICROSCOPIC     Status: Abnormal   Collection Time   01/23/12  2:31 PM      Component Value Range Comment   Color, Urine YELLOW  YELLOW     APPearance CLEAR  CLEAR     Specific Gravity, Urine 1.010  1.005 - 1.030     pH 6.0  5.0 - 8.0     Glucose, UA 500 (*) NEGATIVE (mg/dL)    Hgb urine dipstick NEGATIVE  NEGATIVE     Bilirubin Urine NEGATIVE  NEGATIVE     Ketones, ur NEGATIVE  NEGATIVE (mg/dL)    Protein, ur NEGATIVE  NEGATIVE (mg/dL)    Urobilinogen, UA 0.2  0.0 - 1.0 (mg/dL)    Nitrite NEGATIVE  NEGATIVE     Leukocytes, UA NEGATIVE  NEGATIVE  MICROSCOPIC NOT DONE ON URINES WITH NEGATIVE PROTEIN, BLOOD, LEUKOCYTES, NITRITE, OR GLUCOSE <1000 mg/dL.   URINE CULTURE     Status: Normal   Collection Time   01/23/12  2:31 PM      Component Value Range Comment   Specimen Description URINE, CLEAN CATCH      Special Requests NONE      Culture  Setup Time [id number]      Colony Count 20,OOO COLONIES/ML      Culture        Value: Multiple bacterial morphotypes present, none predominant. Suggest appropriate recollection if clinically indicated.   Report Status 01/25/2012 FINAL     GLUCOSE, CAPILLARY     Status: Abnormal   Collection Time   01/23/12  5:52 PM      Component  Value Range Comment   Glucose-Capillary 198 (*) 70 - 99 (mg/dL)    Comment 1 Notify RN      Comment 2 Documented in Chart     TSH     Status: Normal   Collection Time   01/23/12  6:30 PM      Component Value Range Comment   TSH 0.520  0.350 - 4.500 (uIU/mL)   HEPATIC FUNCTION PANEL     Status: Normal   Collection Time   01/23/12  6:30 PM      Component Value Range Comment   Total Protein 7.4  6.0 - 8.3 (g/dL)    Albumin 3.5  3.5 - 5.2 (g/dL)    AST 15  0 - 37 (U/L)    ALT 16  0 - 35 (U/L)    Alkaline Phosphatase 117  39 - 117 (U/L)    Total Bilirubin 0.3  0.3 - 1.2 (mg/dL)    Bilirubin, Direct <0.1  0.0 - 0.3 (mg/dL)    Indirect Bilirubin NOT CALCULATED  0.3 - 0.9 (mg/dL)   LIPASE, BLOOD     Status: Normal   Collection Time   01/23/12  6:30 PM      Component Value Range Comment   Lipase 29  11 - 59 (U/L)   GLUCOSE, CAPILLARY     Status: Abnormal   Collection Time   01/23/12  8:56 PM      Component Value Range Comment   Glucose-Capillary 258 (*) 70 - 99 (mg/dL)    Comment 1 Notify RN      Comment 2 Documented in Chart     BASIC METABOLIC PANEL     Status: Abnormal   Collection Time   01/24/12  6:16 AM      Component Value Range Comment   Sodium 137  135 - 145 (mEq/L)    Potassium 3.6  3.5 - 5.1 (mEq/L)    Chloride 99  96 - 112 (mEq/L)    CO2 29  19 - 32 (mEq/L)    Glucose, Bld 230 (*) 70 - 99 (mg/dL)    BUN 8  6 - 23 (mg/dL)    Creatinine, Ser 0.65  0.50 -  1.10 (mg/dL)    Calcium 8.9  8.4 - 10.5 (mg/dL)    GFR calc non Af Amer 89 (*) >90 (mL/min)    GFR calc Af Amer >90  >90 (mL/min)   CBC     Status: Abnormal   Collection Time   01/24/12  6:16 AM      Component Value Range Comment   WBC 6.7  4.0 - 10.5 (K/uL)    RBC 5.09  3.87 - 5.11 (MIL/uL)    Hemoglobin 10.8 (*) 12.0 - 15.0 (g/dL)    HCT 34.2 (*) 36.0 - 46.0 (%)    MCV 67.2 (*) 78.0 - 100.0 (fL)    MCH 21.2 (*) 26.0 - 34.0 (pg)    MCHC 31.6  30.0 - 36.0 (g/dL)    RDW 14.7  11.5 - 15.5 (%)    Platelets 201  150 - 400 (K/uL)   GLUCOSE, CAPILLARY     Status: Abnormal   Collection Time   01/24/12  7:21 AM      Component Value Range Comment   Glucose-Capillary 206 (*) 70 - 99 (mg/dL)    Comment 1 Notify RN     GLUCOSE, CAPILLARY     Status: Abnormal   Collection Time   01/24/12 11:27 AM  Component Value Range Comment   Glucose-Capillary 187 (*) 70 - 99 (mg/dL)   GLUCOSE, CAPILLARY     Status: Abnormal   Collection Time   01/24/12  4:26 PM      Component Value Range Comment   Glucose-Capillary 195 (*) 70 - 99 (mg/dL)    Comment 1 Notify RN     AMMONIA     Status: Normal   Collection Time   01/24/12  5:51 PM      Component Value Range Comment   Ammonia 27  11 - 60 (umol/L)   VITAMIN B12     Status: Normal   Collection Time   01/24/12  5:54 PM      Component Value Range Comment   Vitamin B-12 398  211 - 911 (pg/mL)   RPR     Status: Normal   Collection Time   01/24/12  5:54 PM      Component Value Range Comment   RPR NON REACTIVE  NON REACTIVE    GLUCOSE, CAPILLARY     Status: Abnormal   Collection Time   01/24/12 10:40 PM      Component Value Range Comment   Glucose-Capillary 248 (*) 70 - 99 (mg/dL)   GLUCOSE, CAPILLARY     Status: Abnormal   Collection Time   01/25/12  8:03 AM      Component Value Range Comment   Glucose-Capillary 214 (*) 70 - 99 (mg/dL)   GLUCOSE, CAPILLARY     Status: Abnormal   Collection Time   01/25/12 11:53 AM      Component Value Range Comment    Glucose-Capillary 245 (*) 70 - 99 (mg/dL)     Mr Brain Wo Contrast  01/24/2012  *RADIOLOGY REPORT*  Clinical Data: Recent episodes of confusion with headache.  High blood pressure, diabetes and hyperlipidemia.  MRI HEAD WITHOUT CONTRAST  Technique:  Multiplanar, multiecho pulse sequences of the brain and surrounding structures were obtained according to standard protocol without intravenous contrast.  Comparison: 01/23/2012 head CT.  11/23/2010 MR.  Findings: No acute infarct.  No intracranial hemorrhage.  Moderate small vessel disease type changes.  Global atrophy without hydrocephalus.  No intracranial mass lesion noted on this unenhanced motion degraded exam.  Major intracranial vascular structures are patent.  Flow artifact caused by hyperdynamic flow vertebral basilar system.  Mild exophthalmos.  IMPRESSION: No acute infarct.  Moderate small vessel disease type changes.  Global atrophy without hydrocephalus.  Tortuous vertebral arteries and basilar artery.  Original Report Authenticated By: Doug Sou, M.D.   US Abdomen Limited Ruq  01/24/2012  *RADIOLOGY REPORT*  Clinical Data: Vomiting  LIMITED ABDOMINAL ULTRASOUND  Comparison:  None.  Findings: The patient is status post cholecystectomy.  Common bile duct demonstrates a width of 8.4 mm and this is within acceptable limits for a post cholecystectomy patient.  The liver parenchyma appears homogeneous with no definite focal parenchymal abnormality or intrahepatic ductal dilatation.  The hepatic length measures approximately 18 cm in the midclavicular line and suggests hepatomegaly sonographically with a length exceeding 16 cm typically associated with hepatic enlargement.  No right upper quadrant fluid is seen.  IMPRESSION: Prominent hepatic size measuring 18 centimeters in the midclavicular line.  No focal hepatic abnormality suggested  Original Report Authenticated By: Ander Gaster, M.D.     Assessment/Plan:   71 YO female with history  of dementia who has suffered from 4 day history of N/V/D.  During hospitalization her mentation has not returned to baseline.  MRI brain shows no acute infarct and global atrophy. UA shows negative for UTI.  Normal TSH. Glucose has been elevated in mid 200's.    Likely encephalopathy due to dehydration and underlying viral infection.   Recommend: 1) Glucose controle 2) Continue to treat any underlying metabolic abnormalities or infection.  3) If continues to show elevated temperature and confusion with no source of infection may consider LP at that time.  4) would also further investigate amount of Xanax taken at home on daily basis.  I have discussed patient with Dr. Sheran Spine and she has seen the patient and agrees with the above mentioned.    Etta Quill PA-C [facility] [phone]  01/25/2012, 2:25 PM

## 2012-01-25 NOTE — Progress Notes (Addendum)
Assessed patient, lethargic and only arouse to painful stimuli. Vital Sign Stable. Notified rapid response, is at bedside and assessed patient. Thomas Callahan,NP notified and updated of patients condition. Will continue to monitor closely.

## 2012-01-25 NOTE — Progress Notes (Signed)
Chaplain received a Code [employer] for patient Laura Atkins. [name], 71 year old [place] American female about 3:30 p.m.  Chaplain provided pastoral presence, prayer, and conversation to patient's family (husband and two daughters).  Patient was then moved to ICU room 3105.  Chaplain was patient's family to the 3rd floor waiting area.  Patient's family expressed appreciation for Chaplain's attention.  For follow-up, I referred patient's family to the on-call Chaplain for the evening Mr. Lacinda Axon.

## 2012-01-25 NOTE — Progress Notes (Signed)
[facility] Progress Note Patient Name: Laura Atkins DOB: Jul 23, 1941 MRN: [id number]  Date of Service  01/25/2012   HPI/Events of Note  Nurse informed that patient had seizure and received ativan. Currently pt post ictal but maintaining his airway. Saturating 10%, RR 20/min.   eICU Interventions  - Cont. To monitor. - Pt currently not on any long term seizure meds. - Neurology is following the patient, will initiate long term seizure med if pt has another seizure.      Emelda Brothers 01/25/2012, 9:27 PM

## 2012-01-25 NOTE — Progress Notes (Signed)
Called by bedside RN regarding patient's LOC. Patient has had two recent eye surgeries, a fall , and has had a decline in health since these events.  Initial assessment patient only responds to painful stimuli, but once aroused was able to be stimulated only by voice. Patient was oriented to self, place, and able to identify daughter in the room. VSS (see doc flowsheet), patient is protecting airway. Towards the end of the assessment patient made appropriate  humorous comments to daughter and nonverbal cues to rapid response nurse that she was done with being assessed. Advised bedside RN to call with any changes, will continue to monitor.

## 2012-01-25 NOTE — Progress Notes (Signed)
PT Cancellation Note  Treatment cancelled today due to medical issues with patient which prohibited therapy. PT ordered at [facility].  Will follow up tomorrow.  Tahjae Durr 01/25/2012, 11:29 AM

## 2012-01-25 NOTE — Progress Notes (Addendum)
Patient on way to radiology and started to have a tonic colonicseizure. Vital signs taken and patient started having agonal breathing  Resp rate at 38  [facility] response called and  patient on  BP 238/113 HR 120's . Patient placed on non rebreather and code called support arrived. Patient Intubated on Unit. Family notified unit patient transferred to. Report given at the bedside on 3100.

## 2012-01-25 NOTE — Progress Notes (Signed)
Noted, chart reviewed.  Birdie Hopes [phone] 01/25/2012, 4:39 PM

## 2012-01-25 NOTE — Progress Notes (Signed)
Patient extubated to 4 l/m [place].  +folllowing commands, VVS, BBS decreased, spont cough noted with suctioning.  HR 83, SpO2 100% on [place].  Sputum culture obtained before extubation and sent to lab.

## 2012-01-25 NOTE — Consult Note (Signed)
Name: Laura Atkins MRN: [id number] DOB: [birth year]    LOS: 2  [facility] Note   History of Present Illness:  71 y/o AAF with PMH HTN, DM, HLD, Obesity, Depression, GERD, Dementia, Anxiety on chronic Xanax who initially presented to [facility] ED on 5/1 with headache, dizziness, generalized weakness and vomiting.  Unclear if fevers at that time.  In ED noted to be orthostatic, ammonia 27.  Admitted to [facility] for observation.  Transferred to [facility] 5/2 pm. 5/3  Had swallow eval and vomited up lunch, afternoon was on way to Radiology when experienced ? Seizure like (tonic clonic movement).  Intubated per Anesthesia for airway protection.  PCCM consulted for ICU transfer.    Lines / Drains: 5/3 OETT>>>5/3  Cultures: 5/1 UC>>>20k multiple colonies 5/1 UA>>>neg 5/2 RPR>>>non reactive 5/3 BCx2>>> 5/3 Sputum>>> 5/3 Urine>>>  Antibiotics:   Tests / Events: 5/1 CT HEAD>>>No acute or reversible findings. No traumatic finding. Atrophy and chronic small vessel disease.  5/2 MRI>>>No acute infarct. Moderate small vessel disease type changes. Global atrophy without hydrocephalus. Tortuous vertebral arteries and basilar artery    Past Medical History  Diagnosis Date  . Hypertension   . Diabetes mellitus   . Hyperlipidemia   . Obesity   . Anemia   . Anxiety   . Depression   . GERD (gastroesophageal reflux disease)   . Pruritus   . Osteoarthritis   . Right shoulder pain   . Left knee pain   . Hypersomnia   . Urinary incontinence     Past Surgical History  Procedure Date  . Abdominal hysterectomy   . Cyst removed left breast   . Cholecystectomy     Prior to Admission medications   Medication Sig Start Date End Date Taking? Authorizing Provider  ALPRAZolam Duanne Moron) 0.5 MG tablet Take 0.25-0.5 mg by mouth 3 (three) times daily. Take one-half tablet by mouth in the morning and at noon, then take two tablets at bedtime 01/16/12  Yes Fayrene Helper, MD    amLODipine (NORVASC) 10 MG tablet Take 1 tablet (10 mg total) by mouth daily. 06/07/11  Yes Fayrene Helper, MD  aspirin 81 MG tablet Take 81 mg by mouth daily.     Yes Historical Provider, MD  atorvastatin (LIPITOR) 40 MG tablet Take 1 tablet (40 mg total) by mouth daily. 01/26/11 01/26/12 Yes Fayrene Helper, MD  benazepril (LOTENSIN) 20 MG tablet TAKE ONE TABLET BY MOUTH EVERY DAY 08/07/11  Yes Fayrene Helper, MD  clotrimazole-betamethasone (LOTRISONE) cream Apply to affected area 2 times daily 03/21/11 03/20/12 Yes Fayrene Helper, MD  cyclobenzaprine (FLEXERIL) 10 MG tablet Take 10 mg by mouth at bedtime.     Yes Historical Provider, MD  diclofenac (VOLTAREN) 75 MG EC tablet Take 75 mg by mouth 2 (two) times daily as needed.     Yes Historical Provider, MD  furosemide (LASIX) 40 MG tablet Take 1 tablet (40 mg total) by mouth 2 (two) times daily. 06/07/11  Yes Fayrene Helper, MD  gabapentin (NEURONTIN) 300 MG capsule Take 1 capsule (300 mg total) by mouth 3 (three) times daily. 06/07/11  Yes Fayrene Helper, MD  glucose blood (ONE TOUCH ULTRA TEST) test strip Three times daily testing  DX 250.00 07/25/11  Yes Fayrene Helper, MD  HYDROcodone-acetaminophen (NORCO) 5-325 MG per tablet Take 1 tablet by mouth 2 (two) times daily as needed.     Yes Historical Provider, MD  hydrOXYzine (ATARAX)  25 MG tablet Take 1 tablet (25 mg total) by mouth at bedtime as needed. 06/07/11  Yes Fayrene Helper, MD  insulin lispro protamine-insulin lispro (HUMALOG 75/25) (75-25) 100 UNIT/ML SUSP Inject into the skin. Per sliding scale    Yes Historical Provider, MD  ketorolac (ACULAR) 0.4 % SOLN Take 1 drop by mouth 4 times daily. 10/17/11  Yes Historical Provider, MD  Lancets [facility] ULTRASOFT) lancets Use as instructed THREE TIMES DAILY  Dx 250.00 07/30/11 07/29/12 Yes Fayrene Helper, MD  lovastatin (MEVACOR) 40 MG tablet Two tabs at bedtime 01/23/11 01/23/12 Yes Fayrene Helper, MD   multivitamin [facility]) per tablet Take 1 tablet by mouth daily.     Yes Historical Provider, MD  Naproxen-Esomeprazole (VIMOVO) 375-20 MG TBEC Take 1 tablet by mouth 2 (two) times daily. 02/21/11  Yes Fayrene Helper, MD  sertraline (ZOLOFT) 50 MG tablet Take 1 tablet (50 mg total) by mouth daily. Three tabs daily 10/25/11  Yes Kathlee Nations, MD  UNABLE TO FIND Apply 1-2 application topically 4 (four) times daily. Med Name: Dual K (M) Apply as roll-on for 1-2 minutes to affected area(s) two to four times daily   Yes Historical Provider, MD  VICTOZA 18 MG/3ML SOLN Inject into the skin Daily. 11/16/11  Yes Historical Provider, MD  vitamin B-12 (CYANOCOBALAMIN) 500 MCG tablet Take 500 mcg by mouth daily.     Yes Historical Provider, MD  B-D INS SYR ULTRAFINE 1CC/30G 30G X 1/2" 1 ML MISC USE AS DIRECTED 01/03/12   Fayrene Helper, MD    Allergies Allergies  Allergen Reactions  . Penicillins Shortness Of Breath, Itching and Rash  . Prednisone Shortness Of Breath, Itching and Rash  . Propoxyphene-Acetaminophen Itching and Nausea And Vomiting    Family History History reviewed. No pertinent family history.  Social History  reports that she has never smoked. She has never used smokeless tobacco. She reports that she does not drink alcohol or use illicit drugs.  Review Of Systems: unable to complete as patient is altered / intubated  Vital Signs: Temp:  [98 F (36.7 C)-103.5 F (39.7 C)] 102.5 F (39.2 C) (05/03 1700) Pulse Rate:  [71-98] 91  (05/03 1700) Resp:  [16-24] 17  (05/03 1700) BP: (122-177)/(60-86) 155/61 mmHg (05/03 1700) SpO2:  [92 %-100 %] 100 % (05/03 1700) Weight:  [276 lb 14.4 oz (125.6 kg)] 276 lb 14.4 oz (125.6 kg) (05/02 2100) I/O last 3 completed shifts: In: [id number] [P.O.:120; I.V.:3700; IV Piggyback:10] Out: 1300 [Urine:1300]  Physical Examination: General: elderly female, intubated Neuro: sedated / post-ictal CV: s1s2 rrr, no m/r/g, no jvd PULM: resp's  even/non-labored via ETT / ambu-bag, lungs clear on R, diminished on left GI: obese/soft, bsx4 hypoactive Extremities: warm/dry, no edema   Ventilator settings: Vent Mode:  [-] CPAP Pressure Support:  [5 cmH20] 5 cmH20  Labs    CBC  Lab 01/25/12 1541 01/24/12 0616 01/23/12 1430  HGB 11.9* 10.8* 11.2*  HCT 37.3 34.2* 35.8*  WBC 19.3* 6.7 6.9  PLT 319 201 201     BMET  Lab 01/25/12 1541 01/25/12 1504 01/24/12 0616 01/23/12 1430  NA 134* -- 137 134*  K 3.7 -- 3.6 --  CL 93* -- 99 94*  CO2 19 -- 29 30  GLUCOSE 233* -- 230* 234*  BUN 11 -- 8 9  CREATININE 0.61 -- 0.65 0.59  CALCIUM 9.1 -- 8.9 9.4  MG -- 1.6 -- --  PHOS -- -- -- --  No results found for this basename: INR:5 in the last 168 hours  No results found for this basename: PHART:5,PCO2:5,PCO2ART:5,PO2:5,PO2ART:5,HCO3:5,TCO2:5,O2SAT:5 in the last 168 hours   Radiology: 5/3 CXR>>>mild edema pattern   Assessment and Plan:  Acute Respiratory Failure Assessment: intubated for airway protection post seizure activity.  Tx to ICU, placed on 5/5 and tolerated wean efforts Plan: -meets criteria for extubation -sputum culture prior to extubation -O2 to keep sats >92% -f/u cxr in am   Vomiting / Nausea Assessment: PTA, last vomited on 5/3 after lunch.  ? Gastroparesis vs. Viral enteritis.   Plan: -KUB -PPI -zofran  Headache / Rule Out encephalitis / Suspected Seziures Assessment: change in mental status, headaches-probable seizure.  RPR negative.  Chronic xanax use and concern for withdrawal as precipitating factor.  CT Head & MRI negative.  Ammonia negative.  New fever 5/3 (102).   Plan: -PRN ativan for seizures -Neuro Following -EEG Pending    DM Assessment:  Plan: -hold lantus -NPO -Resistant scale  Anxiety / Depression -hold PO zoloft, resume when able to take PO's      Best practices / Disposition: -->Code Status: Full Code -->DVT Px: Lovenox -->GI Px: Protonix -->Diet:  NPO    Noe Gens, NP-C [facility] Pgr: [phone]  01/25/2012, 5:43 PM    PCCM Attending: Seen with ACNP Alfredo Martinez.  Pt examined and database reviewed. I agree with above findings, assessment and plan as reflected in the note above. 40 mins CCM time  She was intubated in the aftermath of apparent seizure and shortly thereafter was able to F/C. Now extubated and looks good.  I am concerned about the symptom complex of AMS, possible seizure, fever. I wonder if HSV encephalitis should be entertained as a possibility. Neuro to see pt and will consider further  Has been transferred to [facility] service while in ICU. After transfer out of ICU, TR[facility] to assume care  Merton Border, MD;  PCCM service; [place] [phone]

## 2012-01-25 NOTE — Progress Notes (Signed)
[facility] Progress Note Patient Name: Laura Atkins DOB: Aug 19, 1941 MRN: [id number]  Date of Service  01/25/2012   HPI/Events of Note  While reviewing patients chart (during rounds), we found patient is febrile since 1 pm today, Tmax 103 F. His WBC is 19 K. She had 2 seizures today.  eICU Interventions  - We will empirically treat for bacterial meningitis. - We will also get an LP (will start abx first). - I discussed the case with Dr Nicole Kindred (neurologist on call) who is actively involved in the care of the patient. He concurs. He recommended not to start acyclovir. - Cultures cooking. - Pt on Keppra now.      Emelda Brothers 01/25/2012, 10:04 PM

## 2012-01-25 NOTE — Code Documentation (Signed)
CODE BLUE NOTE  Patient Name: Laura Atkins   MRN: [id number]   Date of Birth/ Sex: Feb 19, 1941 , female      Admission Date: 01/23/2012  Attending Provider: Wilhelmina Mcardle, MD  Primary Diagnosis: <principal problem not specified>    Indication: Pt was in her usual state of health until this PM, when she was noted to be seizing for approximately 5 min which spontaneously resolved without medication. Etiology of seizure unclear at the time. Previous brain MRI and head CT showed no acute change. Was found to have agonal breathing. Oxygen sats at 98%. Code blue was subsequently called. At the time of arrival on scene, ACLS protocol was underway.    Technical Description:  - CPR performance duration:  na  - Was defibrillation or cardioversion used? No   - Was external pacer placed? No  - Was patient intubated? Yes, for airway protection    Medications Administered: Y = Yes; Blank = No Amiodarone    Atropine    Calcium    Epinephrine    Lidocaine    Magnesium    Norepinephrine    Phenylephrine    Sodium bicarbonate    Vasopressin      Post CPR evaluation:  - Final Status - Was patient successfully resuscitated ? Yes - What is current rhythm? Sinus tach - What is current hemodynamic status? No pressors, hypertensive in post-ictal state   Miscellaneous Information:  - Labs sent, including: CBC, CMP, coags, CXR  - Primary team notified?  Yes and at bedside. CCM consulted to be transferred to ICU  - Family Notified? Yes, by primary physician    Kandis Nab, MD  01/25/2012, 3:56 PM

## 2012-01-25 NOTE — Progress Notes (Signed)
Inpatient Diabetes Program Recommendations  AACE/ADA: New Consensus Statement on Inpatient Glycemic Control (2009)  Target Ranges:  Prepandial:   less than 140 mg/dL      Peak postprandial:   less than 180 mg/dL (1-2 hours)      Critically ill patients:  140 - 180 mg/dL   Reason for Visit: Hyperglycemia Pt takes Humalog 75/25 at home.  This includes basal and meal coverage. Pt at 126 kg  Inpatient Diabetes Program Recommendations Insulin - Basal: Increase to 20 units (ocntinue to increase Lantus until fasting glucose is controlled less than 150 mg/dL) Correction (SSI): Increase to resistant tidwc plus HS scale (until eating carb mod diet, then add meal coverage and reduce correction to moderate tidwc) Once eating carb modified diet, can add meal coverage and reduce correction as needed.  Note: Thank you, Rosita Kea, RN, CNS, Diabetes Coordinator [phone])

## 2012-01-25 NOTE — Progress Notes (Signed)
[facility] hospitalist progress note. Chief complaint. Transfer. History of present illness. A 71 year old female presented to [facility] emergency room with confusion and nausea. Also complaining of headache. There were appear to be some waxing and waning of level of consciousness with intermittent confusion and hypersomnolence. Patient had been on Xanax and Neurontin. MRI of the brain was obtained And is found no acute infarct, moderate small vessel disease, global atrophy and hydrocephalus, etc. Per the family's assistance the patient was transferred to [facility]. I am seeing the patient post transfer to ensure her current stability and to insure her orders transferred correctly. Vital signs temperature 90.8, pulse 71, respiration 20, blood pressure 163/82. O2 sats 100%. General appearance. Obese elderly female in no distress. She is somnolent but easily aroused. Apparently she had an incidence of a difficulty to arouse on arrival and rapid response I did see her at the bedside. They recommended to a nursing to continue to monitor. Cardiac. Rate and rhythm regular. No jugular venous distention. She does have pedal edema. Lungs. A poor inspiratory effort but clear without distress or cough. No crackles appreciated. Abdomen. Soft obese with positive bowel sounds. No pain, guarding, rebound tenderness. Urinary genital. No bladder pain or CVA tenderness. Neurologic. The patient moves all 4 extremities independently. No unilateral or focal defects identified. Impression/plan Problem #1 encephalopathy. Etiology of lethargy and confusion unclear. Xanax and Neurontin were discontinued. UA was negative but a urine culture is pending. Problem #2 vomiting. Lipase and LFT normal. Abdominal ultrasound pending and the Protonix initiated. Problem #3 dehydration. Improved. Disposition. Patient appears medically stable at the bedside post transfer. All her orders appear to of transferred appropriately  including neurology consult.

## 2012-01-26 ENCOUNTER — Observation Stay (HOSPITAL_COMMUNITY): Payer: Medicare Other

## 2012-01-26 DIAGNOSIS — R197 Diarrhea, unspecified: Secondary | ICD-10-CM

## 2012-01-26 DIAGNOSIS — R7309 Other abnormal glucose: Secondary | ICD-10-CM

## 2012-01-26 DIAGNOSIS — R569 Unspecified convulsions: Secondary | ICD-10-CM

## 2012-01-26 DIAGNOSIS — E109 Type 1 diabetes mellitus without complications: Secondary | ICD-10-CM

## 2012-01-26 LAB — CBC
HCT: 32.5 % — ABNORMAL LOW (ref 36.0–46.0)
Hemoglobin: 10.4 g/dL — ABNORMAL LOW (ref 12.0–15.0)
MCH: 21.1 pg — ABNORMAL LOW (ref 26.0–34.0)
MCHC: 32 g/dL (ref 30.0–36.0)
MCV: 65.9 fL — ABNORMAL LOW (ref 78.0–100.0)
Platelets: 197 10*3/uL (ref 150–400)
RBC: 4.93 MIL/uL (ref 3.87–5.11)
RDW: 14.7 % (ref 11.5–15.5)
WBC: 11.1 10*3/uL — ABNORMAL HIGH (ref 4.0–10.5)

## 2012-01-26 LAB — COMPREHENSIVE METABOLIC PANEL
ALT: 16 U/L (ref 0–35)
AST: 30 U/L (ref 0–37)
Albumin: 3.2 g/dL — ABNORMAL LOW (ref 3.5–5.2)
Alkaline Phosphatase: 92 U/L (ref 39–117)
BUN: 9 mg/dL (ref 6–23)
CO2: 25 mEq/L (ref 19–32)
Calcium: 8.6 mg/dL (ref 8.4–10.5)
Chloride: 98 mEq/L (ref 96–112)
Creatinine, Ser: 0.63 mg/dL (ref 0.50–1.10)
GFR calc Af Amer: >90 mL/min (ref >90)
GFR calc non Af Amer: >90 mL/min (ref >90)
Glucose, Bld: 128 mg/dL — ABNORMAL HIGH (ref 70–99)
Potassium: 2.9 mEq/L — ABNORMAL LOW (ref 3.5–5.1)
Sodium: 135 mEq/L (ref 135–145)
Total Bilirubin: 0.5 mg/dL (ref 0.3–1.2)
Total Protein: 6.4 g/dL (ref 6.0–8.3)

## 2012-01-26 LAB — HEMOGLOBIN A1C
Hgb A1c MFr Bld: 13.3 % — ABNORMAL HIGH (ref <5.7)
Mean Plasma Glucose: 335 mg/dL — ABNORMAL HIGH (ref <117)

## 2012-01-26 LAB — VITAMIN B12: Vitamin B-12: 347 pg/mL (ref 211–911)

## 2012-01-26 LAB — GLUCOSE, CAPILLARY
Glucose-Capillary: 174 mg/dL — ABNORMAL HIGH (ref 70–99)
Glucose-Capillary: 165 mg/dL — ABNORMAL HIGH (ref 70–99)
Glucose-Capillary: 180 mg/dL — ABNORMAL HIGH (ref 70–99)
Glucose-Capillary: 253 mg/dL — ABNORMAL HIGH (ref 70–99)

## 2012-01-26 LAB — FOLATE: Folate: 17 ng/mL

## 2012-01-26 LAB — SEDIMENTATION RATE: Sed Rate: 32 mm/hr — ABNORMAL HIGH (ref 0–22)

## 2012-01-26 LAB — RPR: RPR Ser Ql: NONREACTIVE

## 2012-01-26 LAB — C-REACTIVE PROTEIN: CRP: 1.11 mg/dL — ABNORMAL HIGH (ref <0.60)

## 2012-01-26 LAB — PRO B NATRIURETIC PEPTIDE: Pro B Natriuretic peptide (BNP): 1625 pg/mL — ABNORMAL HIGH (ref 0–125)

## 2012-01-26 LAB — PROCALCITONIN: Procalcitonin: 0.12 ng/mL

## 2012-01-26 MED ORDER — WHITE PETROLATUM GEL
Status: AC
  Administered 2012-01-26: 06:00:00
  Filled 2012-01-26: qty 5

## 2012-01-26 MED ORDER — BIOTENE DRY MOUTH MT LIQD
15.0000 mL | Freq: Two times a day (BID) | OROMUCOSAL | Status: DC
  Administered 2012-01-27 – 2012-01-28 (×4): 15 mL via OROMUCOSAL

## 2012-01-26 MED ORDER — POTASSIUM CHLORIDE 20 MEQ/15ML (10%) PO LIQD
40.0000 meq | Freq: Two times a day (BID) | ORAL | Status: DC
  Administered 2012-01-26 (×2): 40 meq
  Filled 2012-01-26 (×5): qty 30

## 2012-01-26 MED ORDER — CHLORHEXIDINE GLUCONATE 0.12 % MT SOLN
15.0000 mL | Freq: Two times a day (BID) | OROMUCOSAL | Status: DC
  Administered 2012-01-27 – 2012-01-29 (×5): 15 mL via OROMUCOSAL
  Filled 2012-01-26 (×7): qty 15

## 2012-01-26 MED ORDER — INSULIN GLARGINE 100 UNIT/ML ~~LOC~~ SOLN
5.0000 [IU] | Freq: Every day | SUBCUTANEOUS | Status: DC
  Administered 2012-01-26: 5 [IU] via SUBCUTANEOUS

## 2012-01-26 MED ORDER — PROMOTE PO LIQD
1000.0000 mL | ORAL | Status: DC
  Administered 2012-01-26: 1000 mL
  Filled 2012-01-26 (×5): qty 1000

## 2012-01-26 NOTE — Progress Notes (Signed)
[facility] PROGRESS NOTE    SUBJECTIVE   71 y.o.  With some degree of underlying dementia reported and a poorly controlled diabetic (HgA1c >18) with nausea and vomiting for several days with generalized weakness, poor po intake, dizziness on standing,  waxing and waning responsiveness; initially afebrile then fever 5/3 and seizure reported twice, loaded with Keppra.  No recurrent seizures.  Was on TCAD, benzo and muscle relaxant at home.  Review of computer records reveals a carotid study in 2006 said done for "confusion."  A 2010 Sleep Study also reported moderate OSA responsive to CPAP.  Laser eye surgery done about a week before admission.  Patient said to have a headache since a fall several weeks ago.  Head CT and brain MRI both negative for blood, mass, acute cva, shift.  EEG requested 5/3 but not clear whether done or not; no report found this am.  Empirically started on antibiotics.  Patient this am not very interactive with me but denies current headache.  No active vomiting.  Daughter says the patient only occasionally wears her CPAP mask at night.  OBJECTIVE   Vital signs in last 24 hours: Temp:  [99.5 F (37.5 C)-103.5 F (39.7 C)] 100.4 F (38 C) (05/04 0759) Pulse Rate:  [58-98] 75  (05/04 0800) Resp:  [17-22] 19  (05/04 0800) BP: (122-174)/(50-101) 156/61 mmHg (05/04 0800) SpO2:  [100 %] 100 % (05/04 0800)  Intake/Output from previous day: 05/03 0701 - 05/04 0700 In: 3304.3 [I.V.:1388.3; IV Piggyback:1916] Out: 1217 [Urine:1216; Stool:1] Intake/Output this shift: Total I/O In: 50 [I.V.:50] Out: -  Nutritional status: NPO  Past Medical History  Diagnosis Date  . Hypertension   . Diabetes mellitus   . Hyperlipidemia   . Obesity   . Anemia   . Anxiety   . Depression   . GERD (gastroesophageal reflux disease)   . Pruritus   . Osteoarthritis   . Right shoulder pain   . Left knee pain   . Hypersomnia   . Urinary  incontinence     Neurologic Exam:   Resting on her side.  Easily aroused.  Not verbally interactive with me.  Does look to the examiner.  Appears somewhat dazed.  Not following all commands.  A rare word with me.  Resisted eyelid opening.  EOM gross full and conjugate on inspection.  No obvious nystagmus seen.  No facial asymmetry.  Moves all extremities equally.  Poor attempt to squeeze my fingers.  Unable to adequately asses DTR's due to lack of cooperation but not able to be elicited at biceps and knees.  Neck supple.  Temporal arteries non-distended.  Patient noted to be overweight.    Lab Results: Results for orders placed during the hospital encounter of 01/23/12 (from the past 48 hour(s))  GLUCOSE, CAPILLARY     Status: Abnormal   Collection Time   01/24/12 11:27 AM      Component Value Range Comment   Glucose-Capillary 187 (*) 70 - 99 (mg/dL)   GLUCOSE, CAPILLARY     Status: Abnormal   Collection Time   01/24/12  4:26 PM      Component Value Range Comment   Glucose-Capillary 195 (*) 70 - 99 (mg/dL)    Comment 1 Notify RN  AMMONIA     Status: Normal   Collection Time   01/24/12  5:51 PM      Component Value Range Comment   Ammonia 27  11 - 60 (umol/L)   VITAMIN B12     Status: Normal   Collection Time   01/24/12  5:54 PM      Component Value Range Comment   Vitamin B-12 398  211 - 911 (pg/mL)   RPR     Status: Normal   Collection Time   01/24/12  5:54 PM      Component Value Range Comment   RPR NON REACTIVE  NON REACTIVE    GLUCOSE, CAPILLARY     Status: Abnormal   Collection Time   01/24/12 10:40 PM      Component Value Range Comment   Glucose-Capillary 248 (*) 70 - 99 (mg/dL)   GLUCOSE, CAPILLARY     Status: Abnormal   Collection Time   01/25/12  8:03 AM      Component Value Range Comment   Glucose-Capillary 214 (*) 70 - 99 (mg/dL)   GLUCOSE, CAPILLARY     Status: Abnormal   Collection Time   01/25/12 11:53 AM      Component Value Range Comment   Glucose-Capillary 245 (*)  70 - 99 (mg/dL)   VITAMIN B12     Status: Normal   Collection Time   01/25/12  3:04 PM      Component Value Range Comment   Vitamin B-12 347  211 - 911 (pg/mL)   FOLATE     Status: Normal   Collection Time   01/25/12  3:04 PM      Component Value Range Comment   Folate 17.0     MAGNESIUM     Status: Normal   Collection Time   01/25/12  3:04 PM      Component Value Range Comment   Magnesium 1.6  1.5 - 2.5 (mg/dL)   HEMOGLOBIN A1C     Status: Abnormal   Collection Time   01/25/12  3:04 PM      Component Value Range Comment   Hemoglobin A1C 13.3 (*) <5.7 (%)    Mean Plasma Glucose 335 (*) <117 (mg/dL)   RPR     Status: Normal   Collection Time   01/25/12  3:04 PM      Component Value Range Comment   RPR NON REACTIVE  NON REACTIVE    AMMONIA     Status: Normal   Collection Time   01/25/12  3:05 PM      Component Value Range Comment   Ammonia 31  11 - 60 (umol/L)   PROCALCITONIN     Status: Normal   Collection Time   01/25/12  3:41 PM      Component Value Range Comment   Procalcitonin <0.10     LACTATE DEHYDROGENASE     Status: Abnormal   Collection Time   01/25/12  3:41 PM      Component Value Range Comment   LDH 298 (*) 94 - 250 (U/L)   CBC     Status: Abnormal   Collection Time   01/25/12  3:41 PM      Component Value Range Comment   WBC 19.3 (*) 4.0 - 10.5 (K/uL)    RBC 5.57 (*) 3.87 - 5.11 (MIL/uL)    Hemoglobin 11.9 (*) 12.0 - 15.0 (g/dL)    HCT 37.3  36.0 - 46.0 (%)    MCV 67.0 (*)  78.0 - 100.0 (fL)    MCH 21.4 (*) 26.0 - 34.0 (pg)    MCHC 31.9  30.0 - 36.0 (g/dL)    RDW 14.6  11.5 - 15.5 (%)    Platelets 319  150 - 400 (K/uL)   COMPREHENSIVE METABOLIC PANEL     Status: Abnormal   Collection Time   01/25/12  3:41 PM      Component Value Range Comment   Sodium 134 (*) 135 - 145 (mEq/L)    Potassium 3.7  3.5 - 5.1 (mEq/L)    Chloride 93 (*) 96 - 112 (mEq/L)    CO2 19  19 - 32 (mEq/L)    Glucose, Bld 233 (*) 70 - 99 (mg/dL)    BUN 11  6 - 23 (mg/dL)    Creatinine, Ser 0.61   0.50 - 1.10 (mg/dL)    Calcium 9.1  8.4 - 10.5 (mg/dL)    Total Protein 7.9  6.0 - 8.3 (g/dL)    Albumin 3.8  3.5 - 5.2 (g/dL)    AST 28  0 - 37 (U/L)    ALT 19  0 - 35 (U/L)    Alkaline Phosphatase 122 (*) 39 - 117 (U/L)    Total Bilirubin 0.5  0.3 - 1.2 (mg/dL)    GFR calc non Af Amer >90  >90 (mL/min)    GFR calc Af Amer >90  >90 (mL/min)   APTT     Status: Normal   Collection Time   01/25/12  3:41 PM      Component Value Range Comment   aPTT 28  24 - 37 (seconds)   URINALYSIS, ROUTINE W REFLEX MICROSCOPIC     Status: Abnormal   Collection Time   01/25/12  4:44 PM      Component Value Range Comment   Color, Urine YELLOW  YELLOW     APPearance CLOUDY (*) CLEAR     Specific Gravity, Urine 1.025  1.005 - 1.030     pH 5.5  5.0 - 8.0     Glucose, UA >1000 (*) NEGATIVE (mg/dL)    Hgb urine dipstick MODERATE (*) NEGATIVE     Bilirubin Urine NEGATIVE  NEGATIVE     Ketones, ur 15 (*) NEGATIVE (mg/dL)    Protein, ur 30 (*) NEGATIVE (mg/dL)    Urobilinogen, UA 1.0  0.0 - 1.0 (mg/dL)    Nitrite NEGATIVE  NEGATIVE     Leukocytes, UA NEGATIVE  NEGATIVE    URINE MICROSCOPIC-ADD ON     Status: Normal   Collection Time   01/25/12  4:44 PM      Component Value Range Comment   Squamous Epithelial / LPF RARE  RARE     WBC, UA 0-2  <3 (WBC/hpf)    RBC / HPF 0-2  <3 (RBC/hpf)    Urine-Other AMORPHOUS URATES/PHOSPHATES   LESS THAN 10 mL OF URINE SUBMITTED  STREP PNEUMONIAE URINARY ANTIGEN     Status: Normal   Collection Time   01/25/12  4:45 PM      Component Value Range Comment   Strep Pneumo Urinary Antigen NEGATIVE  NEGATIVE    MRSA PCR SCREENING     Status: Normal   Collection Time   01/25/12  4:45 PM      Component Value Range Comment   MRSA by PCR NEGATIVE  NEGATIVE    GLUCOSE, CAPILLARY     Status: Abnormal   Collection Time   01/25/12  5:15 PM  Component Value Range Comment   Glucose-Capillary 284 (*) 70 - 99 (mg/dL)    Comment 1 Notify RN      Comment 2 Documented in Chart       LACTIC ACID, PLASMA     Status: Abnormal   Collection Time   01/25/12  5:16 PM      Component Value Range Comment   Lactic Acid, Venous 2.3 (*) 0.5 - 2.2 (mmol/L)   GLUCOSE, CAPILLARY     Status: Abnormal   Collection Time   01/25/12  7:01 PM      Component Value Range Comment   Glucose-Capillary 256 (*) 70 - 99 (mg/dL)    Comment 1 Notify RN      Comment 2 Documented in Chart     GLUCOSE, CAPILLARY     Status: Abnormal   Collection Time   01/25/12  8:44 PM      Component Value Range Comment   Glucose-Capillary 245 (*) 70 - 99 (mg/dL)   GLUCOSE, CAPILLARY     Status: Abnormal   Collection Time   01/25/12 11:32 PM      Component Value Range Comment   Glucose-Capillary 188 (*) 70 - 99 (mg/dL)   CBC     Status: Abnormal   Collection Time   01/26/12  4:50 AM      Component Value Range Comment   WBC 11.1 (*) 4.0 - 10.5 (K/uL) WHITE COUNT CONFIRMED ON SMEAR   RBC 4.93  3.87 - 5.11 (MIL/uL)    Hemoglobin 10.4 (*) 12.0 - 15.0 (g/dL)    HCT 32.5 (*) 36.0 - 46.0 (%)    MCV 65.9 (*) 78.0 - 100.0 (fL)    MCH 21.1 (*) 26.0 - 34.0 (pg)    MCHC 32.0  30.0 - 36.0 (g/dL)    RDW 14.7  11.5 - 15.5 (%)    Platelets 197  150 - 400 (K/uL)   COMPREHENSIVE METABOLIC PANEL     Status: Abnormal   Collection Time   01/26/12  4:50 AM      Component Value Range Comment   Sodium 135  135 - 145 (mEq/L)    Potassium 2.9 (*) 3.5 - 5.1 (mEq/L) DELTA CHECK NOTED   Chloride 98  96 - 112 (mEq/L)    CO2 25  19 - 32 (mEq/L)    Glucose, Bld 128 (*) 70 - 99 (mg/dL)    BUN 9  6 - 23 (mg/dL)    Creatinine, Ser 0.63  0.50 - 1.10 (mg/dL)    Calcium 8.6  8.4 - 10.5 (mg/dL)    Total Protein 6.4  6.0 - 8.3 (g/dL)    Albumin 3.2 (*) 3.5 - 5.2 (g/dL)    AST 30  0 - 37 (U/L)    ALT 16  0 - 35 (U/L)    Alkaline Phosphatase 92  39 - 117 (U/L)    Total Bilirubin 0.5  0.3 - 1.2 (mg/dL)    GFR calc non Af Amer >90  >90 (mL/min)    GFR calc Af Amer >90  >90 (mL/min)   PRO B NATRIURETIC PEPTIDE     Status: Abnormal   Collection  Time   01/26/12  4:50 AM      Component Value Range Comment   Pro B Natriuretic peptide (BNP) 1625.0 (*) 0 - 125 (pg/mL)   PROCALCITONIN     Status: Normal   Collection Time   01/26/12  4:50 AM      Component Value Range Comment  Procalcitonin 0.12     GLUCOSE, CAPILLARY     Status: Abnormal   Collection Time   01/26/12  7:58 AM      Component Value Range Comment   Glucose-Capillary 174 (*) 70 - 99 (mg/dL)       Studies/Results:  Personal review of brain MRI.  Small vessel disease only.  No acute findings.  No masses, shift, acute ischemia, ventriculomegaly.  Dg Abd 1 View  01/25/2012  *RADIOLOGY REPORT*  Clinical Data: 71 year old female obstruction, ileus.  ABDOMEN - 1 VIEW  Comparison: CT abdomen pelvis 03/30/2011 and earlier.  Findings: Portable supine AP views of the abdomen pelvis 1840 hours.  Respiratory motion. Nonobstructed bowel gas pattern. Grossly negative lung bases.  Lower thoracic compression fracture at T10.  T11 may also be compressed.   Otherwise no acute osseous abnormality.  Pelvic phleboliths.  No definite pneumoperitoneum on these supine views.  IMPRESSION: 1.  Respiratory motion. Nonobstructed bowel gas pattern. 2.  Age indeterminate thoracic compression fractures, new since July 2012.  Per CMS PQRS reporting requirements (PQRS Measure 24): Given the patient's age of greater than 80 and the fracture site (hip, distal radius, or spine), the patient should be tested for osteoporosis using DXA, and the appropriate treatment considered based on the DXA results.  Original Report Authenticated By: Randall An, M.D.   Mr Brain Wo Contrast  01/24/2012  *RADIOLOGY REPORT*  Clinical Data: Recent episodes of confusion with headache.  High blood pressure, diabetes and hyperlipidemia.  MRI HEAD WITHOUT CONTRAST  Technique:  Multiplanar, multiecho pulse sequences of the brain and surrounding structures were obtained according to standard protocol without intravenous contrast.   Comparison: 01/23/2012 head CT.  11/23/2010 MR.  Findings: No acute infarct.  No intracranial hemorrhage.  Moderate small vessel disease type changes.  Global atrophy without hydrocephalus.  No intracranial mass lesion noted on this unenhanced motion degraded exam.  Major intracranial vascular structures are patent.  Flow artifact caused by hyperdynamic flow vertebral basilar system.  Mild exophthalmos.  IMPRESSION: No acute infarct.  Moderate small vessel disease type changes.  Global atrophy without hydrocephalus.  Tortuous vertebral arteries and basilar artery.  Original Report Authenticated By: Doug Sou, M.D.   Dg Chest Port 1 View  01/26/2012  *RADIOLOGY REPORT*  Clinical Data: Endotracheal tube removal.  PORTABLE CHEST - 1 VIEW  Comparison: 01/25/2012.  Findings: The endotracheal tube has been removed.  The heart is mildly enlarged but stable.  Low lung volumes with vascular crowding and bibasilar atelectasis.  Mild interstitial prominence but no overt pulmonary edema or pleural effusions.  IMPRESSION:  1.  Removal of endotracheal tube. 2.  Low lung volumes with vascular crowding and basilar atelectasis.  Original Report Authenticated By: P. Kalman Jewels, M.D.   Dg Chest Port 1 View  01/25/2012  *RADIOLOGY REPORT*  Clinical Data: Respiratory distress.  Seizure.  PORTABLE CHEST - 1 VIEW  Comparison: 01/11/2012.  Findings: Endotracheal tube is in place at the level of the carina directed towards the right mainstem bronchus.  Recommend retracting by 2.5 to 3 cm.  Radiopaque structure adjacent to the endotracheal tube may be related to such. Clinical correlation recommended.  Pulmonary edema most notable centrally.  Poor inspiration. Cardiomegaly.  No gross pneumothorax.  No segmental consolidation. Mild elevation left hemidiaphragm.  Calcified slightly tortuous aorta.  IMPRESSION: Endotracheal tube is in place at the level of the carina directed towards the right mainstem bronchus.  Recommend  retracting by 2.5 to 3 cm.  Radiopaque structure adjacent to the endotracheal tube may be related to such. Clinical correlation recommended.  Pulmonary edema most notable centrally.  Cardiomegaly.  Critical Value/emergent results were called by telephone at the time of interpretation on [id number]  at 4:02 p.m.  to  [facility] the patient's nurse ([id number])., who verbally acknowledged these results.  Original Report Authenticated By: Doug Sou, M.D.   US Abdomen Limited Ruq  01/24/2012  *RADIOLOGY REPORT*  Clinical Data: Vomiting  LIMITED ABDOMINAL ULTRASOUND  Comparison:  None.  Findings: The patient is status post cholecystectomy.  Common bile duct demonstrates a width of 8.4 mm and this is within acceptable limits for a post cholecystectomy patient.  The liver parenchyma appears homogeneous with no definite focal parenchymal abnormality or intrahepatic ductal dilatation.  The hepatic length measures approximately 18 cm in the midclavicular line and suggests hepatomegaly sonographically with a length exceeding 16 cm typically associated with hepatic enlargement.  No right upper quadrant fluid is seen.  IMPRESSION: Prominent hepatic size measuring 18 centimeters in the midclavicular line.  No focal hepatic abnormality suggested  Original Report Authenticated By: Ander Gaster, M.D.    Medications:    Medication lists were personally reviewed (PTA and scheduled).  Assessment/Plan:   Assessment: 1.  Encephalopathy.  Possibilities include decompensated dementia, rule out complex partial status epilepticus, metabolic/toxic encephalopathy.  I think it is unlikely that this is meningitis/meningoencephalitis.  No acute abnormalities on head CT and MR imaging.  Her OSA may also have resulted in relative cerebral hypoxemia and increased confusion and/or decreased concentration and functioning with continued poor sleep. 2.  Generalized seizure x 2 reported, associated with fever.  Possible etiologies include  lowered threshold in demented patient with fever and sleep deprivation; associated with dementia alone; related to benzodiazepine withdrawal. 3.  History of persistent nausea and vomiting in a diabetic.  No posterior fossa brain abnormalities or ICH/SAH on imaging.  Recommendations: 1.  EEG on Monday 2.  Continue Keppra for now. 3.  I requested that the daughter bring in the patient's CPAP mask to use here. 4.  ESR, CRP 5.  Consider LP  I spoke with the patient's daughter.  Lubertha South, MD [facility] [phone]  01/26/2012, 8:42 AM

## 2012-01-26 NOTE — Progress Notes (Addendum)
INITIAL ADULT NUTRITION ASSESSMENT Date: 01/26/2012   Time: 9:55 AM Reason for Assessment: MD Consult for TF initiation and management  ASSESSMENT: Female 71 y.o.  Dx: Generalized weakness, vomiting, orthostatic hypotension, dehydration  Hx:  Past Medical History  Diagnosis Date  . Hypertension   . Diabetes mellitus   . Hyperlipidemia   . Obesity   . Anemia   . Anxiety   . Depression   . GERD (gastroesophageal reflux disease)   . Pruritus   . Osteoarthritis   . Right shoulder pain   . Left knee pain   . Hypersomnia   . Urinary incontinence     Related Meds:  Scheduled Meds:   . chlorhexidine  15 mL Mouth/Throat BID  . enoxaparin  40 mg Subcutaneous Q24H  . insulin aspart  0-15 Units Subcutaneous Q4H  . insulin glargine  5 Units Subcutaneous QHS  . ketorolac  1 drop Both Eyes QID  . levetiracetam  1,000 mg Intravenous Once  . levetiracetam  500 mg Intravenous Q12H  . moxifloxacin  400 mg Intravenous Q24H  . pantoprazole (PROTONIX) IV  40 mg Intravenous Q24H  . potassium chloride  40 mEq Per Tube BID  . sulfamethoxazole-trimethoprim  5 mg/kg of trimethoprim (Adjusted) Intravenous Q8H  . vancomycin  1,250 mg Intravenous Q12H  . vancomycin  2,000 mg Intravenous Once  . white petrolatum      . DISCONTD: amLODipine  10 mg Oral Daily  . DISCONTD: aspirin  81 mg Oral Daily  . DISCONTD: atorvastatin  40 mg Oral q1800  . DISCONTD: benazepril  20 mg Oral Daily  . DISCONTD: insulin aspart  0-15 Units Subcutaneous TID WC  . DISCONTD: insulin aspart  0-4 Units Subcutaneous Q4H  . DISCONTD: insulin aspart  0-5 Units Subcutaneous QHS  . DISCONTD: insulin aspart  0-7 Units Subcutaneous Q4H  . DISCONTD: insulin glargine  10 Units Subcutaneous QHS  . DISCONTD: meclizine  25 mg Oral TID  . DISCONTD: sertraline  50 mg Oral Daily  . DISCONTD: vancomycin  20 mg/kg/day Intravenous Q12H   Continuous Infusions:   . sodium chloride 50 mL/hr at 01/26/12 0200  . DISCONTD: sodium  chloride 75 mL/hr at 01/25/12 1800  . DISCONTD: dextrose    . DISCONTD: insulin (NOVOLIN-R) infusion     PRN Meds:.acetaminophen, acetaminophen, LORazepam, ondansetron (ZOFRAN) IV, ondansetron, DISCONTD: acetaminophen, DISCONTD: acetaminophen, DISCONTD: acetaminophen, DISCONTD: acetaminophen, DISCONTD: fentaNYL, DISCONTD: midazolam   Ht: 5\' 9"  (175.3 cm)  Wt: 276 lb 14.4 oz (125.6 kg)  Ideal Wt: 65.9 kg % Ideal Wt: 191%  Wt Readings from Last 14 Encounters:  01/24/12 276 lb 14.4 oz (125.6 kg)  12/13/11 274 lb 1.3 oz (124.322 kg)  12/03/11 280 lb 1.9 oz (127.062 kg)  10/25/11 279 lb (126.554 kg)  10/11/11 285 lb (129.275 kg)  08/07/11 275 lb (124.739 kg)  06/07/11 272 lb 1.3 oz (123.415 kg)  03/21/11 265 lb (120.203 kg)  02/21/11 275 lb (124.739 kg)  01/09/11 282 lb 0.6 oz (127.933 kg)  12/07/10 281 lb (127.461 kg)  11/07/10 285 lb 8 oz (129.502 kg)  09/08/10 295 lb 4 oz (133.925 kg)  06/30/10 305 lb 12 oz (138.687 kg)   Usual Wt: 275-282 lb one year ago % Usual Wt: 100%  Per previous RD note, patient has intentionally lost some weight due to recommendation from MD.  Body mass index is 40.89 kg/(m^2). Morbid Obesity (class 3 extreme obesity)  Food/Nutrition Related Hx: Recent poor appetite and poor PO intake  Labs:  CMP     Component Value Date/Time   NA 135 01/26/2012 0450   K 2.9* 01/26/2012 0450   CL 98 01/26/2012 0450   CO2 25 01/26/2012 0450   GLUCOSE 128* 01/26/2012 0450   BUN 9 01/26/2012 0450   CREATININE 0.63 01/26/2012 0450   CREATININE 0.82 09/21/2011 1045   CALCIUM 8.6 01/26/2012 0450   PROT 6.4 01/26/2012 0450   ALBUMIN 3.2* 01/26/2012 0450   AST 30 01/26/2012 0450   ALT 16 01/26/2012 0450   ALKPHOS 92 01/26/2012 0450   BILITOT 0.5 01/26/2012 0450   GFRNONAA >90 01/26/2012 0450   GFRAA >90 01/26/2012 0450    CBG (last 3)   Basename 01/26/12 0758 01/25/12 2332 01/25/12 2044  GLUCAP 174* 188* 245*     Intake/Output Summary (Last 24 hours) at 01/26/12 1001 Last data  filed at 01/26/12 0900  Gross per 24 hour  Intake 3404.34 ml  Output   1217 ml  Net 2187.34 ml    Diet Order: NPO  Supplements/Tube Feeding: RD received consult for TF initiation and management  IVF:    sodium chloride Last Rate: 50 mL/hr at 01/26/12 0200  DISCONTD: sodium chloride Last Rate: 75 mL/hr at 01/25/12 1800  DISCONTD: dextrose   DISCONTD: insulin (NOVOLIN-R) infusion     Estimated Nutritional Needs:   Kcal: 1850-2000 Protein: 115-130 grams Fluid: >2 liters  Patient vomited after swallow evaluation yesterday, likely aspirated per discussion with CCM NP.  Patient was intubated after episode of vomiting; extubated this morning.  RD received consult for TF initiation and management.  Panda NG tube was placed this morning for TF administration.  NUTRITION DIAGNOSIS: -Inadequate oral intake (NI-2.1).  Status: Ongoing  RELATED TO: inability to eat  AS EVIDENCED BY: NPO status  MONITORING/EVALUATION(Goals):  Goal:  Enteral nutrition to meet 90-100% of estimated nutrition needs.  Monitor:  TF tolerance, labs, weight trend.  EDUCATION NEEDS: -Education not appropriate at this time  INTERVENTION: Start TF via [place] NG tube with Promote at 20 ml/h, increase by 10 ml every 4 hours to goal rate of 80 ml/h to provide 1920 kcals, 120 grams protein, 1611 ml free water daily.  Dietitian #:  [phone]   [name] Per approved criteria  -Morbid Obesity    Laura Atkins 01/26/2012, 9:55 AM

## 2012-01-26 NOTE — Progress Notes (Signed)
Name: Laura Atkins MRN: [id number] DOB: [birth year] PCP- Tula Nakayama   LOS: 3  PCCM PROGRESS NOTE   History of Present Illness:  71 y/o AAF with PMH HTN, DM, HLD, Obesity, Depression, GERD, Dementia, Anxiety on chronic Xanax who initially presented to [facility] ED on 5/1 with headache, dizziness, generalized weakness and vomiting.  Unclear if fevers at that time.  In ED noted to be orthostatic, ammonia 27.  Admitted to [facility] for observation.  Transferred to [facility] 5/2 pm. 5/3  Had swallow eval and vomited up lunch, was on way to Radiology when experienced ? Seizure (tonic clonic movement).  Intubated per Anesthesia for airway protection.  PCCM consulted for ICU transfer.    Lines / Drains: 5/3 OETT>>>5/3  Cultures: 5/1 UC>>>20k multiple colonies 5/1 UA>>>neg 5/2 RPR>>>non reactive 5/3 BCx2>>> 5/3 Sputum>>> 5/3 Urine>>>  Antibiotics: Avelox 5/3>>> Bactrim 5/3>>> Vanc 5/3>>>  Tests / Events: 5/1 CT HEAD>>>No acute or reversible findings. No traumatic finding. Atrophy and chronic small vessel disease.  5/2 MRI>>>No acute infarct. Moderate small vessel disease type changes. Global atrophy without hydrocephalus. Tortuous vertebral arteries and basilar artery  Vital Signs: Temp:  [99.5 F (37.5 C)-103.5 F (39.7 C)] 100.4 F (38 C) (05/04 0759) Pulse Rate:  [58-98] 64  (05/04 0900) Resp:  [17-22] 17  (05/04 0900) BP: (118-167)/(50-101) 118/66 mmHg (05/04 0900) SpO2:  [100 %] 100 % (05/04 0900)  I/O last 3 completed shifts: In: 4634.3 [I.V.:2718.3; IV Piggyback:1916] Out: 1217 [Urine:1216; Stool:1]  Physical Examination: General: chronically ill appearing female, NAD  HEENT: mm moist, endentulous, neck supple Neuro: awake, alert, follows commands intermittently, MAE CV: s1s2 rrr, no m/r/g, no jvd PULM: resp's even/non-labored on [place], diminished bases  GI: obese/soft, bsx4 hypoactive Extremities: warm/dry, no edema  Labs    CBC  Lab 01/26/12 0450 01/25/12 1541  01/24/12 0616  HGB 10.4* 11.9* 10.8*  HCT 32.5* 37.3 34.2*  WBC 11.1* 19.3* 6.7  PLT 197 319 201   BMET  Lab 01/26/12 0450 01/25/12 1541 01/25/12 1504 01/24/12 0616 01/23/12 1430  NA 135 134* -- 137 134*  K 2.9* 3.7 -- -- --  CL 98 93* -- 99 94*  CO2 25 19 -- 29 30  GLUCOSE 128* 233* -- 230* 234*  BUN 9 11 -- 8 9  CREATININE 0.63 0.61 -- 0.65 0.59  CALCIUM 8.6 9.1 -- 8.9 9.4  MG -- -- 1.6 -- --  PHOS -- -- -- -- --   Radiology: Mr Brain Wo Contrast  01/24/2012  *RADIOLOGY REPORT*  Clinical Data: Recent episodes of confusion with headache.  High blood pressure, diabetes and hyperlipidemia.  MRI HEAD WITHOUT CONTRAST  Technique:  Multiplanar, multiecho pulse sequences of the brain and surrounding structures were obtained according to standard protocol without intravenous contrast.  Comparison: 01/23/2012 head CT.  11/23/2010 MR.  Findings: No acute infarct.  No intracranial hemorrhage.  Moderate small vessel disease type changes.  Global atrophy without hydrocephalus.  No intracranial mass lesion noted on this unenhanced motion degraded exam.  Major intracranial vascular structures are patent.  Flow artifact caused by hyperdynamic flow vertebral basilar system.  Mild exophthalmos.  IMPRESSION: No acute infarct.  Moderate small vessel disease type changes.  Global atrophy without hydrocephalus.  Tortuous vertebral arteries and basilar artery.  Original Report Authenticated By: Doug Sou, M.D.   Dg Chest Port 1 View  01/26/2012  *RADIOLOGY REPORT*  Clinical Data: Endotracheal tube removal.  PORTABLE CHEST - 1 VIEW  Comparison: 01/25/2012.  Findings:  The endotracheal tube has been removed.  The heart is mildly enlarged but stable.  Low lung volumes with vascular crowding and bibasilar atelectasis.  Mild interstitial prominence but no overt pulmonary edema or pleural effusions.  IMPRESSION:  1.  Removal of endotracheal tube. 2.  Low lung volumes with vascular crowding and basilar atelectasis.   Original Report Authenticated By: P. Kalman Jewels, M.D.   Assessment and Plan:  Acute Respiratory Failure-- Resolved.  Intubated 5/3 for airway protection post seizure activity.  Tx to ICU, placed on PS and tolerated wean efforts, Extubated 5/3 Plan: - f/u CXR - pulmonary hygiene  - avoid sedating meds   Vomiting / Nausea--  PTA, last vomited on 5/3 after lunch.  ? Gastroparesis vs. Viral enteritis.  Improved.  Plan: -KUB neg -PPI -zofran -place panda for TF 5/4  Headache / Rule Out encephalitis / Suspected Seizures/ Fevers-- change in mental status, headaches, probable seizure.  RPR negative.  Chronic xanax use and concern for withdrawal as precipitating factor.  CT Head & MRI negative.  Ammonia negative.  New fever 5/3 (102).  Neck supple.  Neuro following.  Plan: -PRN ativan for seizures -Neuro Following -EEG Pending -empiric abx for bacterial meningitis started 5/3 -would not suggest LP at this point (already on ABx, clinically no signs of bacterial meningitis)  DM PLAN -add low dose lantus, may need to increase with TF -NPO -Resistant scale  Anxiety / Depression -hold PO zoloft, resume when able to take PO's  Best practices / Disposition: -Code Status: Full Code -DVT Px: Lovenox -GI Px: Protonix -Diet: NPO -Has been transferred to [facility] service while in ICU. After transfer out of ICU, TRH to assume care  [facility], NP 01/26/2012  9:30 AM Pager: [phone]  Care during the described time interval was provided by me and/or other providers on the critical care team. I have reviewed this patient's available data, including medical history, events of note, physical examination and test results as part of my evaluation.  Penne Lash, M.D., F.C.C.P. [facility] [place] Cell: [phone] Pager: [phone]

## 2012-01-26 NOTE — Progress Notes (Signed)
01/26/12  7:10 PT Note: Acknowledged PT discontinue order.  Please reorder when needed.  01/26/2012 Cyndia Bent, PT, DPT [phone]

## 2012-01-27 DIAGNOSIS — R569 Unspecified convulsions: Secondary | ICD-10-CM

## 2012-01-27 DIAGNOSIS — J209 Acute bronchitis, unspecified: Secondary | ICD-10-CM

## 2012-01-27 DIAGNOSIS — I951 Orthostatic hypotension: Secondary | ICD-10-CM

## 2012-01-27 DIAGNOSIS — J40 Bronchitis, not specified as acute or chronic: Secondary | ICD-10-CM

## 2012-01-27 DIAGNOSIS — R197 Diarrhea, unspecified: Secondary | ICD-10-CM

## 2012-01-27 LAB — CBC
HCT: 31.2 % — ABNORMAL LOW (ref 36.0–46.0)
Hemoglobin: 9.9 g/dL — ABNORMAL LOW (ref 12.0–15.0)
MCH: 20.9 pg — ABNORMAL LOW (ref 26.0–34.0)
MCHC: 31.7 g/dL (ref 30.0–36.0)
MCV: 65.8 fL — ABNORMAL LOW (ref 78.0–100.0)
Platelets: 186 10*3/uL (ref 150–400)
RBC: 4.74 MIL/uL (ref 3.87–5.11)
RDW: 14.7 % (ref 11.5–15.5)
WBC: 9.1 10*3/uL (ref 4.0–10.5)

## 2012-01-27 LAB — LEGIONELLA ANTIGEN, URINE: Legionella Antigen, Urine: NEGATIVE

## 2012-01-27 LAB — BASIC METABOLIC PANEL
BUN: 10 mg/dL (ref 6–23)
CO2: 25 mEq/L (ref 19–32)
Calcium: 8.6 mg/dL (ref 8.4–10.5)
Chloride: 99 mEq/L (ref 96–112)
Creatinine, Ser: 0.75 mg/dL (ref 0.50–1.10)
GFR calc Af Amer: >90 mL/min (ref >90)
GFR calc non Af Amer: 85 mL/min — ABNORMAL LOW (ref >90)
Glucose, Bld: 186 mg/dL — ABNORMAL HIGH (ref 70–99)
Potassium: 3.3 mEq/L — ABNORMAL LOW (ref 3.5–5.1)
Sodium: 134 mEq/L — ABNORMAL LOW (ref 135–145)

## 2012-01-27 LAB — GLUCOSE, CAPILLARY
Glucose-Capillary: 237 mg/dL — ABNORMAL HIGH (ref 70–99)
Glucose-Capillary: 212 mg/dL — ABNORMAL HIGH (ref 70–99)
Glucose-Capillary: 198 mg/dL — ABNORMAL HIGH (ref 70–99)
Glucose-Capillary: 217 mg/dL — ABNORMAL HIGH (ref 70–99)

## 2012-01-27 MED ORDER — SERTRALINE HCL 50 MG PO TABS
50.0000 mg | ORAL_TABLET | Freq: Every day | ORAL | Status: DC
  Administered 2012-01-27: 50 mg via ORAL
  Filled 2012-01-27 (×2): qty 1

## 2012-01-27 MED ORDER — POTASSIUM CHLORIDE CRYS ER 20 MEQ PO TBCR
40.0000 meq | EXTENDED_RELEASE_TABLET | Freq: Two times a day (BID) | ORAL | Status: DC
  Administered 2012-01-27 (×2): 40 meq via ORAL
  Filled 2012-01-27 (×4): qty 2

## 2012-01-27 MED ORDER — INSULIN GLARGINE 100 UNIT/ML ~~LOC~~ SOLN
10.0000 [IU] | Freq: Every day | SUBCUTANEOUS | Status: DC
  Administered 2012-01-27: 10 [IU] via SUBCUTANEOUS

## 2012-01-27 MED ORDER — POTASSIUM CHLORIDE 20 MEQ/15ML (10%) PO LIQD: 40.0000 meq | Freq: Two times a day (BID) | ORAL | Status: DC

## 2012-01-27 MED ORDER — AMLODIPINE BESYLATE 5 MG PO TABS
5.0000 mg | ORAL_TABLET | Freq: Every day | ORAL | Status: DC
  Administered 2012-01-27: 5 mg via ORAL
  Filled 2012-01-27 (×2): qty 1

## 2012-01-27 NOTE — Progress Notes (Signed)
Name: Laura Atkins MRN: [id number] DOB: 04/18/1941 PCP- Tula Nakayama   LOS: 4  PCCM PROGRESS NOTE   History of Present Illness:  71 y/o AAF with PMH HTN, DM, HLD, Obesity, Depression, GERD, Dementia, Anxiety on chronic Xanax who initially presented to [facility] ED on 5/1 with headache, dizziness, generalized weakness and vomiting.  Unclear if fevers at that time.  In ED noted to be orthostatic, ammonia 27.  Admitted to [facility] for [facility].  Transferred to [facility] 5/2 pm. 5/3  Had swallow eval and vomited up lunch, was on way to Radiology when experienced ? Seizure (tonic clonic movement).  Intubated per Anesthesia for airway protection.  PCCM consulted for ICU transfer.    Lines / Drains: 5/3 OETT>>>5/3  Cultures: 5/1 UC>>>20k multiple colonies 5/1 UA>>>neg 5/2 RPR>>>non reactive 5/3 BCx2>>> 5/3 Sputum>>>rare GPC rare GPR>>> 5/3 Urine>>>  Antibiotics: Avelox 5/3>>> Bactrim 5/3>>>5/5 Vanc 5/3>>>5/5  Tests / Events: 5/1 CT HEAD>>>No acute or reversible findings. No traumatic finding. Atrophy and chronic small vessel disease.  5/2 MRI>>>No acute infarct. Moderate small vessel disease type changes. Global atrophy without hydrocephalus. Tortuous vertebral arteries and basilar artery  Vital Signs: Temp:  [98.1 F (36.7 C)-100.1 F (37.8 C)] 98.1 F (36.7 C) (05/05 0800) Pulse Rate:  [63-92] 72  (05/05 0800) Resp:  [16-22] 22  (05/05 0800) BP: (108-153)/(45-88) 139/57 mmHg (05/05 0800) SpO2:  [94 %-99 %] 95 % (05/05 0800) Weight:  [269 lb 13.5 oz (122.4 kg)] 269 lb 13.5 oz (122.4 kg) (05/05 0400)  I/O last 3 completed shifts: In: [id number] [I.V.:1380; NG/GT:820; IV Piggyback:3932] Out: [id number] [Urine:3425]  Physical Examination: General: chronically ill appearing female, NAD  HEENT: mm moist, endentulous, neck supple Neuro: more awake, alert, follows commands intermittently, MAE CV: s1s2 rrr, no m/r/g, no jvd PULM: resp's even/non-labored on [place], diminished bases  GI:  obese/soft, bsx4 hypoactive Extremities: warm/dry, no edema  Labs    CBC Lab 01/27/12 0350 01/26/12 0450 01/25/12 1541  HGB 9.9* 10.4* 11.9*  HCT 31.2* 32.5* 37.3  WBC 9.1 11.1* 19.3*  PLT 186 197 319   BMET  Lab 01/27/12 0350 01/26/12 0450 01/25/12 1541 01/25/12 1504 01/24/12 0616 01/23/12 1430  NA 134* 135 134* -- 137 134*  K 3.3* 2.9* -- -- -- --  CL 99 98 93* -- 99 94*  CO2 25 25 19  -- 29 30  GLUCOSE 186* 128* 233* -- 230* 234*  BUN 10 9 11  -- 8 9  CREATININE 0.75 0.63 0.61 -- 0.65 0.59  CALCIUM 8.6 8.6 9.1 -- 8.9 9.4  MG -- -- -- 1.6 -- --  PHOS -- -- -- -- -- --   Radiology: Mr Brain Wo Contrast  01/24/2012  *RADIOLOGY REPORT*  Clinical Data: Recent episodes of confusion with headache.  High blood pressure, diabetes and hyperlipidemia.  MRI HEAD WITHOUT CONTRAST  Technique:  Multiplanar, multiecho pulse sequences of the brain and surrounding structures were obtained according to standard protocol without intravenous contrast.  Comparison: 01/23/2012 head CT.  11/23/2010 MR.  Findings: No acute infarct.  No intracranial hemorrhage.  Moderate small vessel disease type changes.  Global atrophy without hydrocephalus.  No intracranial mass lesion noted on this unenhanced motion degraded exam.  Major intracranial vascular structures are patent.  Flow artifact caused by hyperdynamic flow vertebral basilar system.  Mild exophthalmos.  IMPRESSION: No acute infarct.  Moderate small vessel disease type changes.  Global atrophy without hydrocephalus.  Tortuous vertebral arteries and basilar artery.  Original Report Authenticated By: Remo Lipps  R. Jeannine Kitten, M.D.   Dg Chest Port 1 View  01/26/2012  *RADIOLOGY REPORT*  Clinical Data: Endotracheal tube removal.  PORTABLE CHEST - 1 VIEW  Comparison: 01/25/2012.  Findings: The endotracheal tube has been removed.  The heart is mildly enlarged but stable.  Low lung volumes with vascular crowding and bibasilar atelectasis.  Mild interstitial prominence but no  overt pulmonary edema or pleural effusions.  IMPRESSION:  1.  Removal of endotracheal tube. 2.  Low lung volumes with vascular crowding and basilar atelectasis.  Original Report Authenticated By: P. Kalman Jewels, M.D.   Assessment and Plan:  Acute Respiratory Failure-- Resolved.  Intubated 5/3 for airway protection post seizure activity.  Tx to ICU, placed on PS and tolerated wean efforts, Extubated 5/3.  Evidence of bibasilar pulmonary infiltrate but afebrile and no WBC.  Neck supple. Plan: - F/u CXR - Pulmonary hygiene  - Avoid sedating meds  - Mobilize   Vomiting / Nausea--  PTA, last vomited on 5/3 after lunch.  ? Gastroparesis vs. Viral enteritis.  Improved.  Plan: -KUB neg -PPI -zofran -pulled panda out x 2, will retry swallow eval per speech as she appears more awake 5/5  Headache / Rule Out encephalitis / Suspected Seizures/ Fevers-- change in mental status, headaches, probable seizure.  RPR negative.  Chronic xanax use and concern for withdrawal as precipitating factor.  CT Head & MRI negative.  Ammonia negative.  New fever 5/3 (102).  Neck supple.  Neuro following.  Plan: -PRN ativan for seizures -Neuro Following -EEG Pending -empiric abx for bacterial meningitis started 5/3 - ?continue  -would not suggest LP at this point (already on ABx, clinically no signs of bacterial meningitis)  DM PLAN -may need increase lantus, will hold for now with panda out/TF off -NPO -Resistant scale  Anxiety / Depression -hold PO zoloft, resume when able to take PO's  Best practices / Disposition: -Code Status: Full Code -DVT Px: Lovenox -GI Px: Protonix -Diet: NPO -Has been transferred to [facility] service while in ICU. After transfer out of ICU, TRH to assume care  [facility], NP 01/27/2012  9:01 AM Pager: [phone]  Care during the described time interval was provided by me and/or other providers on the critical care team. I have reviewed this patient's available data,  including medical history, events of note, physical examination and test results as part of my evaluation.  Neck is supple, no evidence of meningitis, bibasilar pulmonary infiltrate.  Will D/C bactrim and vanc, continue avelox til cultures are complete.  Would chang to PO in AM.  Transfer to SDU and to triad's service, PCCM signing off, please call back if needed.  Patient seen and examined, agree with above note.  I dictated the care and orders written for this patient under my direction.  Jennet Maduro, M.D. [phone]

## 2012-01-27 NOTE — Evaluation (Signed)
Clinical/Bedside Swallow Evaluation Patient Details  Name: Laura Atkins MRN: [id number] Date of Birth: August 28, 1941  Today's Date: 01/27/2012 Time: 1200-1230 SLP Time Calculation (min): 30 min  Past Medical History:  Past Medical History  Diagnosis Date  . Hypertension   . Diabetes mellitus   . Hyperlipidemia   . Obesity   . Anemia   . Anxiety   . Depression   . GERD (gastroesophageal reflux disease)   . Pruritus   . Osteoarthritis   . Right shoulder pain   . Left knee pain   . Hypersomnia   . Urinary incontinence    Past Surgical History:  Past Surgical History  Procedure Date  . Abdominal hysterectomy   . Cyst removed left breast   . Cholecystectomy    HPI:  71 y/o female brought to [facility] ED on 01/23/12 with headache, dizziness, generalized weakness, and vomiting. CT scan unremarkable with no evidence of acute or subacute infarction.  Patient transferred to [facility] per family request on 01/25/12. Patient with seizure like activity on 01/25/12 and intubated for one day.  No prior history of dysphagia.  Patient's spouse present during evaluation and stated that patient had prior difficulty at home as her swallow always sounded "loud". Patient referred for BSE to assess risk for aspiration secondary to fluctuating mentation and recent intubation.   Assessment / Plan / Recommendation Clinical Impression  Minimal oropharyngeal dysphagia marked by slight delay in initiation of swallow with immediate throat clears after initial swallow of thin liquid by cup. No other s/s of aspiration observed with swallows in succession with thin liquids. Recommend to proceed with modified diet of dysphagia three (Mechanical soft ) secondary to noted lethargy . Patient with extended mastication with mechanical soft trials but effective.  ST treatment indicated for diet tolerance as aspiration risk remains.     Aspiration Risk  Moderate    Diet Recommendation Dysphagia 3 (Mechanical Soft);Thin liquid   Other   Recommendations Full supervision with all meals, out of bed for meals, remain upright for at least 30 minutes following, small bites and sips  Follow Up Recommendations   To be determined    Frequency and Duration 2x weekly for 2 weeks.          SLP Swallow Goals Patient will consume recommended diet without observed clinical signs of aspiration with: Minimal assistance Patient will utilize recommended strategies during swallow to increase swallowing safety with: Minimal assistance          Oral/Motor/Sensory Function Overall Oral Motor/Sensory Function: Appears within functional limits for tasks assessed   Thin Liquid  Presentation: Cup Pharyngeal Phase Impairments: Throat Clearing - Immediate;Delayed Swallow Other Comments: Immediate throat clears s/p initial swallow of trial thin liquid only no s/s of aspiration with subsequent swallows        Nectar Thick Nectar Thick Liquid: Not tested   Honey Thick Honey Thick Liquid: Not tested   Puree Puree: Within functional limits   Solid Solid: Impaired Presentation: Spoon Oral Phase Functional Implications: Oral holding Other Comments: extended mastication  but effective    Sharman Crate [place], [facility]  01/27/2012,4:47 PM

## 2012-01-27 NOTE — Progress Notes (Signed)
[facility] PROGRESS NOTE    SUBJECTIVE  No more seizures.  No history of seizures before.  Didn't sleep well last night per her daughter.   OBJECTIVE   Vital signs in last 24 hours: Temp:  [98.1 F (36.7 C)-100.1 F (37.8 C)] 98.7 F (37.1 C) (05/05 1200) Pulse Rate:  [65-87] 67  (05/05 0900) Resp:  [16-22] 19  (05/05 0900) BP: (108-148)/(45-88) 137/69 mmHg (05/05 0900) SpO2:  [95 %-99 %] 99 % (05/05 0900) Weight:  [122.4 kg (269 lb 13.5 oz)] 122.4 kg (269 lb 13.5 oz) (05/05 0400)  Intake/Output from previous day: 05/04 0701 - 05/05 0700 In: 3756.8 [I.V.:920.8; NG/GT:820; IV Piggyback:2016] Out: 2405 [Urine:2405] Intake/Output this shift: Total I/O In: 465 [I.V.:50; NG/GT:60; IV Piggyback:355] Out: 775 [Urine:775] Nutritional status: Dysphagia  Past Medical History  Diagnosis Date  . Hypertension   . Diabetes mellitus   . Hyperlipidemia   . Obesity   . Anemia   . Anxiety   . Depression   . GERD (gastroesophageal reflux disease)   . Pruritus   . Osteoarthritis   . Right shoulder pain   . Left knee pain   . Hypersomnia   . Urinary incontinence     Neurologic Exam:  Awake, alert.  Sitting in chair.  Oriented to person, family members, [facility], month of May.  Poor eye contact.  Normal but sparse language.  No facial asymmetry.  Full EOMs.  Follows 1-step commands.  Power 5/5 UEs and proximal LEs.  Gross touch intact and symmetrical in UEs.  Lab Results: Reviewed lab results.  ESR 32.  CRP 1.11 (elevated)  Results for orders placed during the hospital encounter of 01/23/12 (from the past 48 hour(s))  VITAMIN B12     Status: Normal   Collection Time   01/25/12  3:04 PM      Component Value Range Comment   Vitamin B-12 347  211 - 911 (pg/mL)   FOLATE     Status: Normal   Collection Time   01/25/12  3:04 PM      Component Value Range Comment   Folate 17.0     MAGNESIUM     Status: Normal   Collection Time   01/25/12  3:04 PM      Component Value Range Comment   Magnesium 1.6  1.5 - 2.5 (mg/dL)   HEMOGLOBIN A1C     Status: Abnormal   Collection Time   01/25/12  3:04 PM      Component Value Range Comment   Hemoglobin A1C 13.3 (*) <5.7 (%)    Mean Plasma Glucose 335 (*) <117 (mg/dL)   RPR     Status: Normal   Collection Time   01/25/12  3:04 PM      Component Value Range Comment   RPR NON REACTIVE  NON REACTIVE    AMMONIA     Status: Normal   Collection Time   01/25/12  3:05 PM      Component Value Range Comment   Ammonia 31  11 - 60 (umol/L)   PROCALCITONIN     Status: Normal   Collection Time   01/25/12  3:41 PM      Component Value Range Comment   Procalcitonin <0.10     LACTATE  DEHYDROGENASE     Status: Abnormal   Collection Time   01/25/12  3:41 PM      Component Value Range Comment   LDH 298 (*) 94 - 250 (U/L)   CBC     Status: Abnormal   Collection Time   01/25/12  3:41 PM      Component Value Range Comment   WBC 19.3 (*) 4.0 - 10.5 (K/uL)    RBC 5.57 (*) 3.87 - 5.11 (MIL/uL)    Hemoglobin 11.9 (*) 12.0 - 15.0 (g/dL)    HCT 37.3  36.0 - 46.0 (%)    MCV 67.0 (*) 78.0 - 100.0 (fL)    MCH 21.4 (*) 26.0 - 34.0 (pg)    MCHC 31.9  30.0 - 36.0 (g/dL)    RDW 14.6  11.5 - 15.5 (%)    Platelets 319  150 - 400 (K/uL)   COMPREHENSIVE METABOLIC PANEL     Status: Abnormal   Collection Time   01/25/12  3:41 PM      Component Value Range Comment   Sodium 134 (*) 135 - 145 (mEq/L)    Potassium 3.7  3.5 - 5.1 (mEq/L)    Chloride 93 (*) 96 - 112 (mEq/L)    CO2 19  19 - 32 (mEq/L)    Glucose, Bld 233 (*) 70 - 99 (mg/dL)    BUN 11  6 - 23 (mg/dL)    Creatinine, Ser 0.61  0.50 - 1.10 (mg/dL)    Calcium 9.1  8.4 - 10.5 (mg/dL)    Total Protein 7.9  6.0 - 8.3 (g/dL)    Albumin 3.8  3.5 - 5.2 (g/dL)    AST 28  0 - 37 (U/L)    ALT 19  0 - 35 (U/L)    Alkaline Phosphatase 122 (*) 39 - 117 (U/L)    Total Bilirubin 0.5  0.3 - 1.2 (mg/dL)    GFR calc non Af Amer >90  >90 (mL/min)      GFR calc Af Amer >90  >90 (mL/min)   APTT     Status: Normal   Collection Time   01/25/12  3:41 PM      Component Value Range Comment   aPTT 28  24 - 37 (seconds)   URINALYSIS, ROUTINE W REFLEX MICROSCOPIC     Status: Abnormal   Collection Time   01/25/12  4:44 PM      Component Value Range Comment   Color, Urine YELLOW  YELLOW     APPearance CLOUDY (*) CLEAR     Specific Gravity, Urine 1.025  1.005 - 1.030     pH 5.5  5.0 - 8.0     Glucose, UA >1000 (*) NEGATIVE (mg/dL)    Hgb urine dipstick MODERATE (*) NEGATIVE     Bilirubin Urine NEGATIVE  NEGATIVE     Ketones, ur 15 (*) NEGATIVE (mg/dL)    Protein, ur 30 (*) NEGATIVE (mg/dL)    Urobilinogen, UA 1.0  0.0 - 1.0 (mg/dL)    Nitrite NEGATIVE  NEGATIVE     Leukocytes, UA NEGATIVE  NEGATIVE    URINE MICROSCOPIC-ADD ON     Status: Normal   Collection Time   01/25/12  4:44 PM      Component Value Range Comment   Squamous Epithelial / LPF RARE  RARE     WBC, UA 0-2  <3 (WBC/hpf)    RBC / HPF 0-2  <3 (RBC/hpf)    Urine-Other AMORPHOUS URATES/PHOSPHATES   LESS  THAN 10 mL OF URINE SUBMITTED  STREP PNEUMONIAE URINARY ANTIGEN     Status: Normal   Collection Time   01/25/12  4:45 PM      Component Value Range Comment   Strep Pneumo Urinary Antigen NEGATIVE  NEGATIVE    MRSA PCR SCREENING     Status: Normal   Collection Time   01/25/12  4:45 PM      Component Value Range Comment   MRSA by PCR NEGATIVE  NEGATIVE    CULTURE, BLOOD (ROUTINE X 2)     Status: Normal (Preliminary result)   Collection Time   01/25/12  5:05 PM      Component Value Range Comment   Specimen Description BLOOD ARM RIGHT      Special Requests BOTTLES DRAWN AEROBIC ONLY 5CC      Culture  Setup Time [id number]      Culture        Value:        BLOOD CULTURE RECEIVED NO GROWTH TO DATE CULTURE WILL BE HELD FOR 5 DAYS BEFORE ISSUING A FINAL NEGATIVE REPORT   Report Status PENDING     CULTURE, BLOOD (ROUTINE X 2)     Status: Normal (Preliminary result)   Collection Time    01/25/12  5:15 PM      Component Value Range Comment   Specimen Description BLOOD HAND RIGHT      Special Requests        Value: BOTTLES DRAWN AEROBIC AND ANAEROBIC 6CC AER 3CC ANA   Culture  Setup Time [id number]      Culture        Value:        BLOOD CULTURE RECEIVED NO GROWTH TO DATE CULTURE WILL BE HELD FOR 5 DAYS BEFORE ISSUING A FINAL NEGATIVE REPORT   Report Status PENDING     GLUCOSE, CAPILLARY     Status: Abnormal   Collection Time   01/25/12  5:15 PM      Component Value Range Comment   Glucose-Capillary 284 (*) 70 - 99 (mg/dL)    Comment 1 Notify RN      Comment 2 Documented in Chart     LACTIC ACID, PLASMA     Status: Abnormal   Collection Time   01/25/12  5:16 PM      Component Value Range Comment   Lactic Acid, Venous 2.3 (*) 0.5 - 2.2 (mmol/L)   CULTURE, RESPIRATORY     Status: Normal (Preliminary result)   Collection Time   01/25/12  6:02 PM      Component Value Range Comment   Specimen Description TRACHEAL ASPIRATE      Special Requests NONE      Gram Stain        Value: RARE WBC PRESENT, PREDOMINANTLY PMN     MODERATE SQUAMOUS EPITHELIAL CELLS PRESENT     RARE GRAM POSITIVE COCCI     RARE GRAM POSITIVE RODS   Culture PENDING      Report Status PENDING     GLUCOSE, CAPILLARY     Status: Abnormal   Collection Time   01/25/12  7:01 PM      Component Value Range Comment   Glucose-Capillary 256 (*) 70 - 99 (mg/dL)    Comment 1 Notify RN      Comment 2 Documented in Chart     GLUCOSE, CAPILLARY     Status: Abnormal   Collection Time   01/25/12  8:44 PM  Component Value Range Comment   Glucose-Capillary 245 (*) 70 - 99 (mg/dL)   GLUCOSE, CAPILLARY     Status: Abnormal   Collection Time   01/25/12 11:32 PM      Component Value Range Comment   Glucose-Capillary 188 (*) 70 - 99 (mg/dL)   CBC     Status: Abnormal   Collection Time   01/26/12  4:50 AM      Component Value Range Comment   WBC 11.1 (*) 4.0 - 10.5 (K/uL) WHITE COUNT CONFIRMED ON SMEAR   RBC 4.93   3.87 - 5.11 (MIL/uL)    Hemoglobin 10.4 (*) 12.0 - 15.0 (g/dL)    HCT 32.5 (*) 36.0 - 46.0 (%)    MCV 65.9 (*) 78.0 - 100.0 (fL)    MCH 21.1 (*) 26.0 - 34.0 (pg)    MCHC 32.0  30.0 - 36.0 (g/dL)    RDW 14.7  11.5 - 15.5 (%)    Platelets 197  150 - 400 (K/uL)   COMPREHENSIVE METABOLIC PANEL     Status: Abnormal   Collection Time   01/26/12  4:50 AM      Component Value Range Comment   Sodium 135  135 - 145 (mEq/L)    Potassium 2.9 (*) 3.5 - 5.1 (mEq/L) DELTA CHECK NOTED   Chloride 98  96 - 112 (mEq/L)    CO2 25  19 - 32 (mEq/L)    Glucose, Bld 128 (*) 70 - 99 (mg/dL)    BUN 9  6 - 23 (mg/dL)    Creatinine, Ser 0.63  0.50 - 1.10 (mg/dL)    Calcium 8.6  8.4 - 10.5 (mg/dL)    Total Protein 6.4  6.0 - 8.3 (g/dL)    Albumin 3.2 (*) 3.5 - 5.2 (g/dL)    AST 30  0 - 37 (U/L)    ALT 16  0 - 35 (U/L)    Alkaline Phosphatase 92  39 - 117 (U/L)    Total Bilirubin 0.5  0.3 - 1.2 (mg/dL)    GFR calc non Af Amer >90  >90 (mL/min)    GFR calc Af Amer >90  >90 (mL/min)   PRO B NATRIURETIC PEPTIDE     Status: Abnormal   Collection Time   01/26/12  4:50 AM      Component Value Range Comment   Pro B Natriuretic peptide (BNP) 1625.0 (*) 0 - 125 (pg/mL)   PROCALCITONIN     Status: Normal   Collection Time   01/26/12  4:50 AM      Component Value Range Comment   Procalcitonin 0.12     GLUCOSE, CAPILLARY     Status: Abnormal   Collection Time   01/26/12  7:58 AM      Component Value Range Comment   Glucose-Capillary 174 (*) 70 - 99 (mg/dL)   SEDIMENTATION RATE     Status: Abnormal   Collection Time   01/26/12  9:50 AM      Component Value Range Comment   Sed Rate 32 (*) 0 - 22 (mm/hr)   C-REACTIVE PROTEIN     Status: Abnormal   Collection Time   01/26/12  9:50 AM      Component Value Range Comment   CRP 1.11 (*) <0.60 (mg/dL)   GLUCOSE, CAPILLARY     Status: Abnormal   Collection Time   01/26/12 11:15 AM      Component Value Range Comment   Glucose-Capillary 165 (*) 70 - 99 (mg/dL)  GLUCOSE,  CAPILLARY     Status: Abnormal   Collection Time   01/26/12  3:56 PM      Component Value Range Comment   Glucose-Capillary 180 (*) 70 - 99 (mg/dL)   GLUCOSE, CAPILLARY     Status: Abnormal   Collection Time   01/26/12  8:03 PM      Component Value Range Comment   Glucose-Capillary 253 (*) 70 - 99 (mg/dL)    Comment 1 Notify RN     BASIC METABOLIC PANEL     Status: Abnormal   Collection Time   01/27/12  3:50 AM      Component Value Range Comment   Sodium 134 (*) 135 - 145 (mEq/L)    Potassium 3.3 (*) 3.5 - 5.1 (mEq/L)    Chloride 99  96 - 112 (mEq/L)    CO2 25  19 - 32 (mEq/L)    Glucose, Bld 186 (*) 70 - 99 (mg/dL)    BUN 10  6 - 23 (mg/dL)    Creatinine, Ser 0.75  0.50 - 1.10 (mg/dL)    Calcium 8.6  8.4 - 10.5 (mg/dL)    GFR calc non Af Amer 85 (*) >90 (mL/min)    GFR calc Af Amer >90  >90 (mL/min)   CBC     Status: Abnormal   Collection Time   01/27/12  3:50 AM      Component Value Range Comment   WBC 9.1  4.0 - 10.5 (K/uL)    RBC 4.74  3.87 - 5.11 (MIL/uL)    Hemoglobin 9.9 (*) 12.0 - 15.0 (g/dL)    HCT 31.2 (*) 36.0 - 46.0 (%)    MCV 65.8 (*) 78.0 - 100.0 (fL)    MCH 20.9 (*) 26.0 - 34.0 (pg)    MCHC 31.7  30.0 - 36.0 (g/dL)    RDW 14.7  11.5 - 15.5 (%)    Platelets 186  150 - 400 (K/uL)   GLUCOSE, CAPILLARY     Status: Abnormal   Collection Time   01/27/12 11:17 AM      Component Value Range Comment   Glucose-Capillary 212 (*) 70 - 99 (mg/dL)    Comment 1 Documented in Chart      Comment 2 Notify RN        Medications:     I have reviewed the patient's current medications. Scheduled:   . antiseptic oral rinse  15 mL Mouth Rinse q12n4p  . chlorhexidine  15 mL Mouth/Throat BID  . chlorhexidine  15 mL Mouth Rinse BID  . enoxaparin  40 mg Subcutaneous Q24H  . insulin aspart  0-15 Units Subcutaneous Q4H  . insulin glargine  10 Units Subcutaneous QHS  . ketorolac  1 drop Both Eyes QID  . levetiracetam  500 mg Intravenous Q12H  . moxifloxacin  400 mg Intravenous  Q24H  . pantoprazole (PROTONIX) IV  40 mg Intravenous Q24H  . potassium chloride  40 mEq Oral BID  . sulfamethoxazole-trimethoprim  5 mg/kg of trimethoprim (Adjusted) Intravenous Q8H  . vancomycin  1,250 mg Intravenous Q12H  . DISCONTD: insulin glargine  5 Units Subcutaneous QHS  . DISCONTD: potassium chloride  40 mEq Per Tube BID  . DISCONTD: potassium chloride  40 mEq Oral BID    Assessment/Plan:   Assessment: 1. Encephalopathy. Possibilities include decompensated dementia, rule out complex partial status epilepticus, metabolic/toxic encephalopathy. I think it is unlikely that this is meningitis/meningoencephalitis. No acute abnormalities on head CT and MR imaging. Her OSA  may also have resulted in relative cerebral hypoxemia and increased confusion and/or decreased concentration and functioning with continued poor sleep. Much better today 5/5. 2. Generalized seizure x 2 reported, associated with fever. Possible etiologies include lowered threshold in demented patient with fever and sleep deprivation; associated with dementia alone; related to benzodiazepine withdrawal.  3. History of persistent nausea and vomiting in a diabetic. No posterior fossa brain abnormalities or ICH/SAH on imaging.  Plan: 1.  EEG on Monday. 2.  If EEG shows no epileptiform discharges, then will likely D/C Keppra. 3.  Again asked a family member to bring in her CPAP.  Spoke with daughter.  Spoke with critical care re LP not necessary.Lubertha South, MD [facility] [phone]  01/27/2012, 12:34 PM

## 2012-01-28 ENCOUNTER — Inpatient Hospital Stay (HOSPITAL_COMMUNITY): Payer: Medicare Other

## 2012-01-28 DIAGNOSIS — R569 Unspecified convulsions: Secondary | ICD-10-CM

## 2012-01-28 DIAGNOSIS — J9600 Acute respiratory failure, unspecified whether with hypoxia or hypercapnia: Secondary | ICD-10-CM

## 2012-01-28 DIAGNOSIS — R197 Diarrhea, unspecified: Secondary | ICD-10-CM

## 2012-01-28 DIAGNOSIS — R4182 Altered mental status, unspecified: Secondary | ICD-10-CM

## 2012-01-28 DIAGNOSIS — G40109 Localization-related (focal) (partial) symptomatic epilepsy and epileptic syndromes with simple partial seizures, not intractable, without status epilepticus: Secondary | ICD-10-CM

## 2012-01-28 LAB — BASIC METABOLIC PANEL
BUN: 9 mg/dL (ref 6–23)
CO2: 27 mEq/L (ref 19–32)
Calcium: 9 mg/dL (ref 8.4–10.5)
Chloride: 101 mEq/L (ref 96–112)
Creatinine, Ser: 0.81 mg/dL (ref 0.50–1.10)
GFR calc Af Amer: 85 mL/min — ABNORMAL LOW (ref >90)
GFR calc non Af Amer: 73 mL/min — ABNORMAL LOW (ref >90)
Glucose, Bld: 152 mg/dL — ABNORMAL HIGH (ref 70–99)
Potassium: 4 mEq/L (ref 3.5–5.1)
Sodium: 136 mEq/L (ref 135–145)

## 2012-01-28 LAB — GLUCOSE, CAPILLARY
Glucose-Capillary: 127 mg/dL — ABNORMAL HIGH (ref 70–99)
Glucose-Capillary: 152 mg/dL — ABNORMAL HIGH (ref 70–99)
Glucose-Capillary: 162 mg/dL — ABNORMAL HIGH (ref 70–99)
Glucose-Capillary: 178 mg/dL — ABNORMAL HIGH (ref 70–99)
Glucose-Capillary: 188 mg/dL — ABNORMAL HIGH (ref 70–99)
Glucose-Capillary: 200 mg/dL — ABNORMAL HIGH (ref 70–99)
Glucose-Capillary: 211 mg/dL — ABNORMAL HIGH (ref 70–99)
Glucose-Capillary: 214 mg/dL — ABNORMAL HIGH (ref 70–99)
Glucose-Capillary: 247 mg/dL — ABNORMAL HIGH (ref 70–99)

## 2012-01-28 LAB — CULTURE, RESPIRATORY W GRAM STAIN

## 2012-01-28 LAB — CBC
HCT: 31.2 % — ABNORMAL LOW (ref 36.0–46.0)
Hemoglobin: 10 g/dL — ABNORMAL LOW (ref 12.0–15.0)
MCH: 21.1 pg — ABNORMAL LOW (ref 26.0–34.0)
MCHC: 32.1 g/dL (ref 30.0–36.0)
MCV: 65.7 fL — ABNORMAL LOW (ref 78.0–100.0)
Platelets: 179 10*3/uL (ref 150–400)
RBC: 4.75 MIL/uL (ref 3.87–5.11)
RDW: 15 % (ref 11.5–15.5)
WBC: 7.1 10*3/uL (ref 4.0–10.5)

## 2012-01-28 LAB — CULTURE, RESPIRATORY

## 2012-01-28 LAB — FOLATE RBC: RBC Folate: 818 ng/mL — ABNORMAL HIGH (ref >=366)

## 2012-01-28 MED ORDER — MOXIFLOXACIN HCL 400 MG PO TABS
400.0000 mg | ORAL_TABLET | Freq: Every day | ORAL | Status: AC
  Administered 2012-01-28: 400 mg via ORAL
  Filled 2012-01-28: qty 1

## 2012-01-28 MED ORDER — LEVETIRACETAM 500 MG PO TABS
500.0000 mg | ORAL_TABLET | Freq: Two times a day (BID) | ORAL | Status: DC
  Administered 2012-01-28 – 2012-01-29 (×2): 500 mg via ORAL
  Filled 2012-01-28 (×3): qty 1

## 2012-01-28 MED ORDER — METOPROLOL TARTRATE 1 MG/ML IV SOLN: 5.0000 mg | Freq: Four times a day (QID) | INTRAVENOUS | Status: DC

## 2012-01-28 MED ORDER — ENSURE PUDDING PO PUDG
1.0000 | Freq: Three times a day (TID) | ORAL | Status: DC
  Administered 2012-01-28 – 2012-01-29 (×3): 1 via ORAL

## 2012-01-28 MED ORDER — AMLODIPINE BESYLATE 5 MG PO TABS: 5.0000 mg | ORAL_TABLET | Freq: Every day | ORAL | Status: DC

## 2012-01-28 MED ORDER — BENAZEPRIL HCL 10 MG PO TABS
10.0000 mg | ORAL_TABLET | Freq: Every day | ORAL | Status: DC
  Administered 2012-01-28 – 2012-01-29 (×2): 10 mg via ORAL
  Filled 2012-01-28 (×3): qty 1

## 2012-01-28 MED ORDER — INSULIN ASPART 100 UNIT/ML ~~LOC~~ SOLN
0.0000 [IU] | Freq: Three times a day (TID) | SUBCUTANEOUS | Status: DC
  Administered 2012-01-28: 5 [IU] via SUBCUTANEOUS
  Administered 2012-01-29: 3 [IU] via SUBCUTANEOUS
  Administered 2012-01-29: 15 [IU] via SUBCUTANEOUS

## 2012-01-28 MED ORDER — CLONIDINE HCL 0.1 MG/24HR TD PTWK: 0.1000 mg | MEDICATED_PATCH | TRANSDERMAL | Status: DC

## 2012-01-28 MED ORDER — PANTOPRAZOLE SODIUM 40 MG PO TBEC
40.0000 mg | DELAYED_RELEASE_TABLET | Freq: Every day | ORAL | Status: DC
  Administered 2012-01-28 – 2012-01-29 (×2): 40 mg via ORAL
  Filled 2012-01-28: qty 1
  Filled 2012-01-28: qty 22

## 2012-01-28 MED ORDER — SERTRALINE HCL 50 MG PO TABS
50.0000 mg | ORAL_TABLET | Freq: Every day | ORAL | Status: DC
  Administered 2012-01-28 – 2012-01-29 (×2): 50 mg via ORAL
  Filled 2012-01-28 (×2): qty 1

## 2012-01-28 MED ORDER — AMLODIPINE BESYLATE 10 MG PO TABS
10.0000 mg | ORAL_TABLET | Freq: Every day | ORAL | Status: DC
Start: 2012-01-28 — End: 2012-01-29
  Administered 2012-01-28 – 2012-01-29 (×2): 10 mg via ORAL
  Filled 2012-01-28 (×2): qty 1

## 2012-01-28 MED ORDER — INSULIN GLARGINE 100 UNIT/ML ~~LOC~~ SOLN: 16.0000 [IU] | Freq: Every day | SUBCUTANEOUS | Status: DC

## 2012-01-28 MED ORDER — INSULIN GLARGINE 100 UNIT/ML ~~LOC~~ SOLN
18.0000 [IU] | Freq: Every day | SUBCUTANEOUS | Status: DC
  Administered 2012-01-28: 18 [IU] via SUBCUTANEOUS

## 2012-01-28 MED FILL — Medication: Qty: 1 | Status: AC

## 2012-01-28 NOTE — Progress Notes (Signed)
Nutrition Follow-up  Panda tube, Promote formula discontinued. S/p bedside swallow evaluation 5/5. PO intake poor this AM. Per patient's daughter, she does not like the hospital food. Amenable to chocolate Ensure Pudding supplement between meals -- RD to order.  Diet Order:  Dysphagia 3, thin liquids  Meds: Scheduled Meds:   . amLODipine  10 mg Oral Daily  . antiseptic oral rinse  15 mL Mouth Rinse q12n4p  . chlorhexidine  15 mL Mouth Rinse BID  . enoxaparin  40 mg Subcutaneous Q24H  . insulin aspart  0-15 Units Subcutaneous Q4H  . insulin glargine  16 Units Subcutaneous QHS  . ketorolac  1 drop Both Eyes QID  . levetiracetam  500 mg Intravenous Q12H  . moxifloxacin  400 mg Intravenous Q24H  . pantoprazole  40 mg Oral Q1200  . sertraline  50 mg Oral Daily  . DISCONTD: amLODipine  5 mg Oral Daily  . DISCONTD: amLODipine  5 mg Oral Daily  . DISCONTD: chlorhexidine  15 mL Mouth/Throat BID  . DISCONTD: cloNIDine  0.1 mg Transdermal Weekly  . DISCONTD: insulin glargine  10 Units Subcutaneous QHS  . DISCONTD: metoprolol  5 mg Intravenous Q6H  . DISCONTD: pantoprazole (PROTONIX) IV  40 mg Intravenous Q24H  . DISCONTD: potassium chloride  40 mEq Oral BID  . DISCONTD: sertraline  50 mg Oral Daily  . DISCONTD: sulfamethoxazole-trimethoprim  5 mg/kg of trimethoprim (Adjusted) Intravenous Q8H  . DISCONTD: vancomycin  1,250 mg Intravenous Q12H   Continuous Infusions:   . DISCONTD: sodium chloride 20 mL/hr at 01/27/12 1300   PRN Meds:.acetaminophen, acetaminophen, LORazepam, ondansetron (ZOFRAN) IV, ondansetron  Labs:  CMP     Component Value Date/Time   NA 136 01/28/2012 0435   K 4.0 01/28/2012 0435   CL 101 01/28/2012 0435   CO2 27 01/28/2012 0435   GLUCOSE 152* 01/28/2012 0435   BUN 9 01/28/2012 0435   CREATININE 0.81 01/28/2012 0435   CREATININE 0.82 09/21/2011 1045   CALCIUM 9.0 01/28/2012 0435   PROT 6.4 01/26/2012 0450   ALBUMIN 3.2* 01/26/2012 0450   AST 30 01/26/2012 0450   ALT 16 01/26/2012  0450   ALKPHOS 92 01/26/2012 0450   BILITOT 0.5 01/26/2012 0450   GFRNONAA 73* 01/28/2012 0435   GFRAA 85* 01/28/2012 0435     Intake/Output Summary (Last 24 hours) at 01/28/12 1258 Last data filed at 01/28/12 1200  Gross per 24 hour  Intake    930 ml  Output   3825 ml  Net  -2895 ml    CBG (last 3)   Basename 01/28/12 0835 01/28/12 0407 01/28/12 0027  GLUCAP 152* 162* 178*    Weight Status:  122.2 kg (5/6) -- trending down  Re-estimated needs:  1850-2000 kcals, 115-130 gm protein  Nutrition Dx:  Inadequate Oral Intake now r/t limited acceptance of hospital food as evidenced by tray observation, ongoing  New Goal:  Oral intake to meet >90% of estimated nutrition needs, unmet Monitor: PO & supplemental intake, weight, labs, I/O's  Intervention:    Add Ensure Pudding PO TID between meals (170 kcals, 4 gm protein per 4 oz cup)  RD to follow for nutrition care plan  Lady Deutscher Pager #:  [phone]

## 2012-01-28 NOTE — Evaluation (Signed)
Physical Therapy Evaluation Patient Details Name: Laura Atkins MRN: [id number] DOB: November 23, 1940 Today's Date: 01/28/2012 Time: [id number] PT Time Calculation (min): 18 min  PT Assessment / Plan / Recommendation Clinical Impression  Patient s/p vomiting.  Patient confused but was able to participate.  Need to work on balance and endurance for activity.  Should be able to go home with family's assist.    PT Assessment  Patient needs continued PT services    Follow Up Recommendations  Home health PT;Supervision/Assistance - 24 hour    Equipment Recommendations  Rolling walker with 5" wheels    Frequency Min 3X/week    Precautions / Restrictions Precautions Precautions: Fall   Pertinent Vitals/Pain VSS/ No pain      Mobility  Bed Mobility Bed Mobility: Rolling Right;Right Sidelying to Sit;Sitting - Scoot to [employer] of Bed Rolling Right: 4: Min assist Right Sidelying to Sit: 1: +2 Total assist;With rails;HOB flat Right Sidelying to Sit: Patient Percentage: 70% Sitting - Scoot to Edge of Bed: 3: Mod assist Details for Bed Mobility Assistance: cues to sequence and for technique.  Patient needed assist for elevating trunk initially and then completed movement.  Transfers Transfers: Sit to Stand;Stand to Sit;Stand Pivot Transfers Sit to Stand: 1: +2 Total assist;From elevated surface;With upper extremity assist;From bed Sit to Stand: Patient Percentage: 70% Stand to Sit: 1: +2 Total assist;With upper extremity assist;With armrests;To chair/3-in-1 Stand to Sit: Patient Percentage: 70% Stand Pivot Transfers: 1: +2 Total assist Stand Pivot Transfers: Patient Percentage: 70% Details for Transfer Assistance: Patient needed cues for hand placement.  Patient also needed cues for technique.  Patient used RW to stand and pivot to recliner.  Patient with uncontrolled descent into chair even though cued for safety.   Ambulation/Gait Ambulation/Gait Assistance: Not tested (comment) Stairs:  No Wheelchair Mobility Wheelchair Mobility: No         PT Goals Acute Rehab PT Goals PT Goal Formulation: With patient Time For Goal Achievement: 02/11/12 Potential to Achieve Goals: Good Pt will go Supine/Side to Sit: with modified independence;with rail PT Goal: Supine/Side to Sit - Progress: Goal set today Pt will Sit at Edge of Bed: with modified independence;3-5 min;with bilateral upper extremity support PT Goal: Sit at Edge Of Bed - Progress: Goal set today Pt will go Sit to Stand: with supervision;with upper extremity assist PT Goal: Sit to Stand - Progress: Goal set today Pt will go Stand to Sit: with supervision;with upper extremity assist PT Goal: Stand to Sit - Progress: Goal set today Pt will Transfer Bed to Chair/Chair to Bed: with supervision PT Transfer Goal: Bed to Chair/Chair to Bed - Progress: Goal set today Pt will Ambulate: 16 - 50 feet;with least restrictive assistive device;with supervision PT Goal: Ambulate - Progress: Goal set today Pt will Perform Home Exercise Program: Independently PT Goal: Perform Home Exercise Program - Progress: Goal set today  Visit Information  Last PT Received On: 01/28/12 Assistance Needed: +2    Subjective Data  Subjective: " I dont like to do too much of anything." Patient Stated Goal: To get well   Prior Functioning  Home Living Lives With: Spouse (and grandchildren) Available Help at Discharge: Family;Available 24 hours/day Type of Home: House Home Access: Ramped entrance Home Layout: One level Bathroom Shower/Tub: Multimedia programmer: Standard Bathroom Accessibility: No Home Adaptive Equipment: Walker - standard;Straight cane Prior Function Level of Independence: Independent with assistive device(s) Able to Take Stairs?: Yes Driving: No Vocation: 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 difficulties  Cognition  Overall Cognitive Status: Impaired Area of Impairment: Attention;Following  commands;Safety/judgement Arousal/Alertness: Awake/alert Orientation Level: Disoriented to;Time;Situation;Place Behavior During Session: [facility] for tasks performed Current Attention Level: Focused Attention - Other Comments: focused up to 30 seconds Following Commands: Follows one step commands inconsistently Safety/Judgement: Decreased awareness of safety precautions    Extremity/Trunk Assessment Right Upper Extremity Assessment RUE ROM/Strength/Tone: Within functional levels RUE Sensation: WFL - Light Touch RUE Coordination: WFL - gross/fine motor Left Upper Extremity Assessment LUE ROM/Strength/Tone: Within functional levels LUE Sensation: WFL - Light Touch LUE Coordination: WFL - gross/fine motor Right Lower Extremity Assessment RLE ROM/Strength/Tone: Within functional levels RLE Sensation: WFL - Light Touch RLE Coordination: WFL - gross/fine motor Left Lower Extremity Assessment LLE ROM/Strength/Tone: Within functional levels LLE Sensation: WFL - Light Touch LLE Coordination: WFL - gross/fine motor Trunk Assessment Trunk Assessment: Normal   Balance Balance Balance Assessed: Yes Static Sitting Balance Static Sitting - Balance Support: Bilateral upper extremity supported;Feet supported Static Sitting - Level of Assistance: 7: Independent  End of Session PT - End of Session Equipment Utilized During Treatment: Gait belt Activity Tolerance: Patient limited by fatigue Patient left: in chair;with call bell/phone within reach;with family/visitor present Nurse Communication: Mobility status   INGOLD,Laura Atkins 01/28/2012, 3:04 PM  [facility] [phone] (pager)

## 2012-01-28 NOTE — Progress Notes (Addendum)
[facility] PROGRESS NOTE    SUBJECTIVE   No complaints. No further seizure activity. She is aware she is at [facility] and month is May. Able to foolow my commands.   OBJECTIVE   Vital signs in last 24 hours: Temp:  [98.2 F (36.8 C)-98.7 F (37.1 C)] 98.2 F (36.8 C) (05/06 0348) Pulse Rate:  [64-76] 71  (05/06 1009) Resp:  [14-21] 16  (05/06 1009) BP: (128-147)/(52-122) 144/57 mmHg (05/06 1009) SpO2:  [96 %-100 %] 98 % (05/06 1009) Weight:  [122.2 kg (269 lb 6.4 oz)] 122.2 kg (269 lb 6.4 oz) (05/06 0348)  Intake/Output from previous day: 05/05 0701 - 05/06 0700 In: 1510 [P.O.:100; I.V.:640; NG/GT:60; IV Piggyback:710] Out: 2900 [Urine:2900] Intake/Output this shift:   Nutritional status: Dysphagia  Past Medical History  Diagnosis Date  . Hypertension   . Diabetes mellitus   . Hyperlipidemia   . Obesity   . Anemia   . Anxiety   . Depression   . GERD (gastroesophageal reflux disease)   . Pruritus   . Osteoarthritis   . Right shoulder pain   . Left knee pain   . Hypersomnia   . Urinary incontinence     Neurologic Exam:   Mental Status: Alert, oriented, thought content appropriate.  Speech fluent without evidence of aphasia. Able to follow simple 2 step commands without difficulty. Cranial Nerves: II-Visual fields grossly intact. III/IV/VI-Extraocular movements intact.  Pupils reactive bilaterally. V/VII-Smile symmetric VIII-grossly intact IX/X-normal gag XI-bilateral shoulder shrug XII-midline tongue extension Motor: 4/5 bilaterally with normal tone and bulk. No tremor or increased tone noted Sensory: Pinprick and light touch intact throughout, bilaterally Deep Tendon Reflexes: Depressed throughout Plantars: mute Cerebellar: Normal finger-to-nose,   Lab Results: Results for orders placed during the hospital encounter of 01/23/12 (from the past 24 hour(s))  GLUCOSE, CAPILLARY     Status: Abnormal   Collection Time   01/27/12 11:17 AM      Component Value Range   Glucose-Capillary 212 (*) 70 - 99 (mg/dL)   Comment 1 Documented in Chart     Comment 2 Notify RN    GLUCOSE, CAPILLARY     Status: Abnormal   Collection Time   01/27/12  4:00 PM      Component Value Range   Glucose-Capillary 198 (*) 70 - 99 (mg/dL)  GLUCOSE, CAPILLARY     Status: Abnormal   Collection Time   01/27/12  8:49 PM      Component Value Range   Glucose-Capillary 217 (*) 70 - 99 (mg/dL)  GLUCOSE, CAPILLARY     Status: Abnormal   Collection Time   01/28/12 12:27 AM      Component Value Range   Glucose-Capillary 178 (*) 70 - 99 (mg/dL)  GLUCOSE, CAPILLARY     Status: Abnormal   Collection Time   01/28/12  4:07 AM      Component Value Range   Glucose-Capillary 162 (*) 70 - 99 (mg/dL)  CBC     Status: Abnormal   Collection Time   01/28/12  4:35 AM      Component Value Range   WBC 7.1  4.0 - 10.5 (K/uL)   RBC 4.75  3.87 - 5.11 (MIL/uL)   Hemoglobin 10.0 (*) 12.0 - 15.0 (g/dL)   HCT  31.2 (*) 36.0 - 46.0 (%)   MCV 65.7 (*) 78.0 - 100.0 (fL)   MCH 21.1 (*) 26.0 - 34.0 (pg)   MCHC 32.1  30.0 - 36.0 (g/dL)   RDW 15.0  11.5 - 15.5 (%)   Platelets 179  150 - 400 (K/uL)  BASIC METABOLIC PANEL     Status: Abnormal   Collection Time   01/28/12  4:35 AM      Component Value Range   Sodium 136  135 - 145 (mEq/L)   Potassium 4.0  3.5 - 5.1 (mEq/L)   Chloride 101  96 - 112 (mEq/L)   CO2 27  19 - 32 (mEq/L)   Glucose, Bld 152 (*) 70 - 99 (mg/dL)   BUN 9  6 - 23 (mg/dL)   Creatinine, Ser 0.81  0.50 - 1.10 (mg/dL)   Calcium 9.0  8.4 - 10.5 (mg/dL)   GFR calc non Af Amer 73 (*) >90 (mL/min)   GFR calc Af Amer 85 (*) >90 (mL/min)  GLUCOSE, CAPILLARY     Status: Abnormal   Collection Time   01/28/12  8:35 AM      Component Value Range   Glucose-Capillary 152 (*) 70 - 99 (mg/dL)   Comment 1 Notify RN     Lipid Panel No results found for this basename: CHOL,TRIG,HDL,CHOLHDL,VLDL,LDLCALC in the last 72  hours  Studies/Results: Dg Abd Portable 1v  01/26/2012  *RADIOLOGY REPORT*  Clinical Data: Feeding tube placement.  PORTABLE ABDOMEN - 1 VIEW 01/26/2012 1243 hours:  Comparison: Portable abdomen x-ray yesterday.  Findings: Feeding tube courses through the stomach with its tip in the proximal descending duodenum.  Visualized bowel gas pattern unremarkable, with gas in upper normal caliber transverse colon and proximal descending colon.  Numerous pelvic phleboliths.  IMPRESSION: Feeding tube tip in the proximal portion of the descending duodenum.  Original Report Authenticated By: Deniece Portela, M.D.    Medications:     Prior to Admission:  Prescriptions prior to admission  Medication Sig Dispense Refill  . ALPRAZolam (XANAX) 0.5 MG tablet Take 0.25-0.5 mg by mouth 3 (three) times daily. Take one-half tablet by mouth in the morning and at noon, then take two tablets at bedtime      . amLODipine (NORVASC) 10 MG tablet Take 1 tablet (10 mg total) by mouth daily.  30 tablet  5  . aspirin 81 MG tablet Take 81 mg by mouth daily.        Marland Kitchen atorvastatin (LIPITOR) 40 MG tablet Take 1 tablet (40 mg total) by mouth daily.  30 tablet  11  . benazepril (LOTENSIN) 20 MG tablet TAKE ONE TABLET BY MOUTH EVERY DAY  90 tablet  2  . clotrimazole-betamethasone (LOTRISONE) cream Apply to affected area 2 times daily  45 g  1  . cyclobenzaprine (FLEXERIL) 10 MG tablet Take 10 mg by mouth at bedtime.        . diclofenac (VOLTAREN) 75 MG EC tablet Take 75 mg by mouth 2 (two) times daily as needed.        . furosemide (LASIX) 40 MG tablet Take 1 tablet (40 mg total) by mouth 2 (two) times daily.  45 tablet  5  . gabapentin (NEURONTIN) 300 MG capsule Take 1 capsule (300 mg total) by mouth 3 (three) times daily.  90 capsule  5  . glucose blood (ONE TOUCH ULTRA TEST) test strip Three times daily testing  DX 250.00  100 each  11  . HYDROcodone-acetaminophen (Balm)  5-325 MG per tablet Take 1 tablet by mouth 2 (two)  times daily as needed.        . hydrOXYzine (ATARAX) 25 MG tablet Take 1 tablet (25 mg total) by mouth at bedtime as needed.  30 tablet  5  . insulin lispro protamine-insulin lispro (HUMALOG 75/25) (75-25) 100 UNIT/ML SUSP Inject into the skin. Per sliding scale       . ketorolac (ACULAR) 0.4 % SOLN Take 1 drop by mouth 4 times daily.      . Lancets (ONETOUCH ULTRASOFT) lancets Use as instructed THREE TIMES DAILY  Dx 250.00  100 each  12  . lovastatin (MEVACOR) 40 MG tablet Two tabs at bedtime  60 tablet  0  . multivitamin (THERAGRAN) per tablet Take 1 tablet by mouth daily.        . Naproxen-Esomeprazole (VIMOVO) 375-20 MG TBEC Take 1 tablet by mouth 2 (two) times daily.  60 tablet  1  . sertraline (ZOLOFT) 50 MG tablet Take 1 tablet (50 mg total) by mouth daily. Three tabs daily  90 tablet  2  . UNABLE TO FIND Apply 1-2 application topically 4 (four) times daily. Med Name: Dual K (M) Apply as roll-on for 1-2 minutes to affected area(s) two to four times daily      . VICTOZA 18 MG/3ML SOLN Inject into the skin Daily.      . vitamin B-12 (CYANOCOBALAMIN) 500 MCG tablet Take 500 mcg by mouth daily.        . B-D INS SYR ULTRAFINE 1CC/30G 30G X 1/2" 1 ML MISC USE AS DIRECTED  100 each  2   Scheduled:   . amLODipine  10 mg Oral Daily  . antiseptic oral rinse  15 mL Mouth Rinse q12n4p  . chlorhexidine  15 mL Mouth Rinse BID  . enoxaparin  40 mg Subcutaneous Q24H  . insulin aspart  0-15 Units Subcutaneous Q4H  . insulin glargine  16 Units Subcutaneous QHS  . ketorolac  1 drop Both Eyes QID  . levetiracetam  500 mg Intravenous Q12H  . moxifloxacin  400 mg Intravenous Q24H  . pantoprazole  40 mg Oral Q1200  . sertraline  50 mg Oral Daily  . DISCONTD: amLODipine  5 mg Oral Daily  . DISCONTD: amLODipine  5 mg Oral Daily  . DISCONTD: chlorhexidine  15 mL Mouth/Throat BID  . DISCONTD: cloNIDine  0.1 mg Transdermal Weekly  . DISCONTD: insulin glargine  10 Units Subcutaneous QHS  . DISCONTD:  insulin glargine  5 Units Subcutaneous QHS  . DISCONTD: metoprolol  5 mg Intravenous Q6H  . DISCONTD: pantoprazole (PROTONIX) IV  40 mg Intravenous Q24H  . DISCONTD: potassium chloride  40 mEq Per Tube BID  . DISCONTD: potassium chloride  40 mEq Oral BID  . DISCONTD: potassium chloride  40 mEq Oral BID  . DISCONTD: sertraline  50 mg Oral Daily  . DISCONTD: sulfamethoxazole-trimethoprim  5 mg/kg of trimethoprim (Adjusted) Intravenous Q8H  . DISCONTD: vancomycin  1,250 mg Intravenous Q12H    Assessment/Plan:    Patient Active Hospital Problem List:   Altered mental status (01/25/2012)   Assessment: clearing, now able to follow commands, knows she is in [place] [facility], it is may and able to recognize her daughter.  No acute abnormalities on head CT and MR imaging.      Seizure (01/25/2012)   Assessment: No further seizures.  EEG pending   Plan: Continue Keppra 500 mg BID,  If EEG negative consider stopping  Keppra.       Etta Quill PA-C [facility] [phone]  01/28/2012, 10:41 AM

## 2012-01-28 NOTE — Progress Notes (Signed)
Portable EEG completed

## 2012-01-28 NOTE — Progress Notes (Signed)
Speech Language Pathology Dysphagia Treatment Patient Details Name: Laura Atkins MRN: [id number] DOB: 1941/04/19 Today's Date: 01/28/2012 Time: [id number] SLP Time Calculation (min): 15 min  Assessment / Plan / Recommendation Clinical Impression  Minimal oropharyngeal dysphagia again observed, marked by slight delay in initiation with delayed throat clear x1 after initial swallow of thin liquid by cup in 10 trials. No other s/s of aspiration observed with swallows in succession with thin liquids. Recommend to proceed with modified diet of dysphagia three (Mechanical soft ) secondary to noted lethargy. Education provided to pt/family regarding aspriation precautions. Pt may continue current diet. No SLP f/u needed.     Diet Recommendation  Continue with Current Diet: Dysphagia 3 (mechanical soft);Thin liquid    SLP Plan     Pertinent Vitals/Pain N/A   Swallowing Goals  SLP Swallowing Goals Patient will consume recommended diet without observed clinical signs of aspiration with: Minimal assistance Swallow Study Goal #1 - Progress: Met Patient will utilize recommended strategies during swallow to increase swallowing safety with: Minimal assistance Swallow Study Goal #2 - Progress: Met  General Temperature Spikes Noted: No Respiratory Status: Room air Behavior/Cognition: Alert;Cooperative Oral Cavity - Dentition: Adequate natural dentition Patient Positioning: Upright in chair  Dysphagia Treatment Treatment focused on: Skilled observation of diet tolerance Treatment Methods/Modalities: Skilled observation Patient observed directly with PO's: Yes Type of PO's observed: Thin liquids Feeding: Needs set up Liquids provided via: Cup;Straw Oral Phase Signs & Symptoms: Prolonged oral phase Pharyngeal Phase Signs & Symptoms: Suspected delayed swallow initiation;Delayed throat clear   Laura Baltimore, MA CCC-SLP [phone]  Laura Atkins 01/28/2012, 11:36 AM

## 2012-01-28 NOTE — Progress Notes (Signed)
[facility] [place] TEAM 1 - Stepdown/ICU TEAM  Interim history: 71 year old female patient presented to [facility] on 01/23/2012 with headache dizziness, generalized weakness and vomiting. Was found to be orthostatic mildly elevated ammonia level. She was subsequent admitted to [facility] for observation. At family request she was transferred to [facility] on 01/24/2012. On 01/25/2012 went to radiology for swallowing evaluation when developed tonic-clonic activity consistent with possible seizure activity. She was subsequently intubated by anesthesia for airway protection. Pulmonary critical care medicine sent care the patient and the patient was extubated later that day after arrival to ICU. Diagnostic evaluation thus far has been unrevealing. EEG results are pending and patient has been followed by the neurology team. She was transferred to the step down unit on 01/27/2012 and [facility] team 1 has assumed care of the patient today 01/28/2012.  Subjective: Today she is awake and alert. Appears like she is becoming closer to baseline level of mentation. Her daughter is at the bedside. Patient continues to report generalized malaise and anorexia.  Objective: Blood pressure 160/79, pulse 73, temperature 98.1 F (36.7 C), temperature source Oral, resp. rate 20, height 5\' 9"  (1.753 m), weight 122.2 kg (269 lb 6.4 oz), SpO2 98.00%.  Intake/Output from previous day: 05/05 0701 - 05/06 0700 In: 1510 [P.O.:100; I.V.:640; NG/GT:60; IV Piggyback:710] Out: 2900 [Urine:2900] Intake/Output this shift:   General appearance: alert, cooperative, appears older than stated age, distracted and no distress Resp: clear to auscultation bilaterally Cardio: regular rate and rhythm, S1, S2 normal, no murmur, click, rub or gallop GI: soft, non-tender; bowel sounds normal; no masses,  no organomegaly Extremities: extremities normal, atraumatic, no cyanosis or edema Neurologic: Grossly  normal  Lab Results:  Basename 01/28/12 0435 01/27/12 0350  WBC 7.1 9.1  HGB 10.0* 9.9*  HCT 31.2* 31.2*  PLT 179 186   BMET  Basename 01/28/12 0435 01/27/12 0350  NA 136 134*  K 4.0 3.3*  CL 101 99  CO2 27 25  GLUCOSE 152* 186*  BUN 9 10  CREATININE 0.81 0.75  CALCIUM 9.0 8.6   Medications: I have reviewed the patient's current medications.  Assessment/Plan:  Acute respiratory failure with hypoxia *Initially intubated for airway protection and has tolerated extubation *Stable on room air *discontinue empiric Avelox - was being tx for possible associated aspiration pneumonia/pneumonitis that may have occurred with seizure activity but is afebrile w/ normal O2 sats at this time - resp cx not helpful  Altered mental status/Seizure *Improving and appears to be at baseline mental status *No recurrent seizure activity seen *? seizure related to benzodiazepine withdrawal *EEG results pending *Appreciate neurology assistance *Continue Keppra for now, with consideration to stopping after outpt f/u   IDDM *CBG moderately controlled *Increase Lantus and continue sliding scale insulin  Headache/Vomiting/ Generalized weakness *Likely viral etiology *Workup not convincing for meningitis etiology *much improved on exam today  Dehydration/Orthostatic hypotension *Improving-saline lock IV access *Now hypertensive so we'll adjust usual antihypertensive medications-see below  Hypertension *Blood pressure poorly controlled so will increase Norvasc  Hypokalemia *Potassium has normalized so we'll discontinue potassium supplementation  HYPERLIPIDEMIA  Dementia/depression *Continue Zoloft  OBESITY  ANEMIA  Disposition *Transfer to medical floor *PT/OT evaluation   LOS: 5 days   Erin Hearing, ANP pager [phone]  [facility] 1 [email or web address] Password: [email or web address]  01/28/2012, 11:32 AM  I have personally examined this patient and reviewed the entire  database. I have reviewed the above note, made any necessary editorial changes, and agree with its  content.  Cherene Altes, MD [facility]

## 2012-01-29 DIAGNOSIS — R197 Diarrhea, unspecified: Secondary | ICD-10-CM

## 2012-01-29 DIAGNOSIS — R4182 Altered mental status, unspecified: Secondary | ICD-10-CM

## 2012-01-29 DIAGNOSIS — J9600 Acute respiratory failure, unspecified whether with hypoxia or hypercapnia: Secondary | ICD-10-CM

## 2012-01-29 DIAGNOSIS — G40109 Localization-related (focal) (partial) symptomatic epilepsy and epileptic syndromes with simple partial seizures, not intractable, without status epilepticus: Secondary | ICD-10-CM

## 2012-01-29 DIAGNOSIS — I6330 Cerebral infarction due to thrombosis of unspecified cerebral artery: Secondary | ICD-10-CM

## 2012-01-29 DIAGNOSIS — R569 Unspecified convulsions: Secondary | ICD-10-CM

## 2012-01-29 LAB — GLUCOSE, CAPILLARY
Glucose-Capillary: 181 mg/dL — ABNORMAL HIGH (ref 70–99)
Glucose-Capillary: 357 mg/dL — ABNORMAL HIGH (ref 70–99)

## 2012-01-29 MED ORDER — ATORVASTATIN CALCIUM 40 MG PO TABS: 40.0000 mg | ORAL_TABLET | Freq: Every day | ORAL | Status: DC

## 2012-01-29 MED ORDER — LEVETIRACETAM 500 MG PO TABS: 500.0000 mg | ORAL_TABLET | Freq: Two times a day (BID) | ORAL | Status: DC

## 2012-01-29 NOTE — Procedures (Signed)
EEG NUMBER:  [id number]  This routine EEG was requested in this 71 year old woman who has a history of new-onset seizures.  She also has a history of headache, generalized weakness, vomiting, and dizziness.  Her medications include Keppra.  The EEG was done with the patient awake.  Over the left hemisphere, there did appear to be a posterior dominant rhythm of 8-9 cycles per second.  It was not clearly reactive to eye opening.  Over the right hemisphere, activities were more in the theta and delta range.  There did appear to be additional slowing in the right posterior regions.  Photic stimulation did not produce a driving response.  Hyperventilation was not performed.  Patient did not sleep.  CLINICAL INTERPRETATION:  This routine EEG done with the patient awake is abnormal.  The slowing seen over the right hemisphere,  worse in the posterior right quadrant suggest an underlying functional or structural abnormality in that location.  No interictal epileptiform discharges were seen.          ______________________________ Neena Rhymes, MD    [id number] D:  01/29/2012 08:21:01  T:  01/29/2012 08:29:39  Job #:  [id number]

## 2012-01-29 NOTE — Progress Notes (Signed)
Laura Atkins to be D/C'd Home with home health per MD order.  Discussed prescriptions and follow up appointments with the patient. Prescriptions given to patient, medication list explained in detail. Pt verbalized understanding.   [name], [name]  Home Medication Instructions [id number]   Printed on:01/29/12 1416  Medication Information                    vitamin B-12 (CYANOCOBALAMIN) 500 MCG tablet Take 500 mcg by mouth daily.             multivitamin (THERAGRAN) per tablet Take 1 tablet by mouth daily.             aspirin 81 MG tablet Take 81 mg by mouth daily.             insulin lispro protamine-insulin lispro (HUMALOG 75/25) (75-25) 100 UNIT/ML SUSP Inject into the skin. Per sliding scale            Naproxen-Esomeprazole (VIMOVO) 375-20 MG TBEC Take 1 tablet by mouth 2 (two) times daily.           clotrimazole-betamethasone (LOTRISONE) cream Apply to affected area 2 times daily           amLODipine (NORVASC) 10 MG tablet Take 1 tablet (10 mg total) by mouth daily.           furosemide (LASIX) 40 MG tablet Take 1 tablet (40 mg total) by mouth 2 (two) times daily.           gabapentin (NEURONTIN) 300 MG capsule Take 1 capsule (300 mg total) by mouth 3 (three) times daily.           glucose blood (ONE TOUCH ULTRA TEST) test strip Three times daily testing  DX 250.00           Lancets (ONETOUCH ULTRASOFT) lancets Use as instructed THREE TIMES DAILY  Dx 250.00           benazepril (LOTENSIN) 20 MG tablet TAKE ONE TABLET BY MOUTH EVERY DAY           sertraline (ZOLOFT) 50 MG tablet Take 1 tablet (50 mg total) by mouth daily. Three tabs daily           B-D INS SYR ULTRAFINE 1CC/30G 30G X 1/2" 1 ML MISC USE AS DIRECTED           ketorolac (ACULAR) 0.4 % SOLN Take 1 drop by mouth 4 times daily.           VICTOZA 18 MG/3ML SOLN Inject into the skin Daily.           levETIRAcetam (KEPPRA) 500 MG tablet Take 1 tablet (500 mg total) by mouth 2 (two) times  daily.           atorvastatin (LIPITOR) 40 MG tablet Take 1 tablet (40 mg total) by mouth daily.             Filed Vitals:   01/29/12 0541  BP: 132/73  Pulse: 67  Temp: 98.2 F (36.8 C)  Resp: 18    Skin clean, dry and intact without evidence of skin break down, no evidence of skin tears noted. IV catheter discontinued intact. Site without signs and symptoms of complications. Dressing and pressure applied. Pt denies pain at this time. No complaints noted.  An After Visit Summary was printed and given to the patient. Patient escorted via [name], and D/C home via private auto.  [place], [employer] 01/29/2012 2:16  PM

## 2012-01-29 NOTE — Discharge Summary (Signed)
Addendum  Patient seen and examined, chart and data base reviewed.  I agree with the above assessment and plan  For full details please see Mrs. Imogene Burn PA. Note.   Birdie Hopes [phone] 01/29/2012, 2:32 PM

## 2012-01-29 NOTE — Progress Notes (Signed)
[facility] PROGRESS NOTE    SUBJECTIVE   Patient is alert and able to hold a conversation. She continues to have some slowed thought process.  Has had no further seizures.   OBJECTIVE   Vital signs in last 24 hours: Temp:  [98.1 F (36.7 C)-98.4 F (36.9 C)] 98.2 F (36.8 C) (05/07 0541) Pulse Rate:  [65-73] 67  (05/07 0541) Resp:  [18-20] 18  (05/07 0541) BP: (129-160)/(70-79) 132/73 mmHg (05/07 0541) SpO2:  [97 %-98 %] 98 % (05/07 0541) Weight:  [119.2 kg (262 lb 12.6 oz)] 119.2 kg (262 lb 12.6 oz) (05/07 0541)  Intake/Output from previous day: 05/06 0701 - 05/07 0700 In: 285 [P.O.:100; I.V.:80; IV Piggyback:105] Out: [id number] [Urine:4425] Intake/Output this shift: Total I/O In: 240 [P.O.:240] Out: -  Nutritional status: Dysphagia  Past Medical History  Diagnosis Date  . Hypertension   . Diabetes mellitus   . Hyperlipidemia   . Obesity   . Anemia   . Anxiety   . Depression   . GERD (gastroesophageal reflux disease)   . Pruritus   . Osteoarthritis   . Right shoulder pain   . Left knee pain   . Hypersomnia   . Urinary incontinence     Neurologic Exam:  Mental Status: Alert, oriented times three.  Speech fluent without evidence of aphasia. Able to follow 3 step commands without difficulty. Cranial Nerves:  II-Visual fields grossly intact.  III/IV/VI-Extraocular movements intact. Pupils reactive bilaterally.  V/VII-Smile symmetric  VIII-grossly intact  IX/X-normal gag  XI-bilateral shoulder shrug  XII-midline tongue extension  Motor: 4/5 bilaterally with normal tone and bulk. No tremor or increased tone noted  Sensory: Pinprick and light touch intact throughout, bilaterally  Deep Tendon Reflexes: Depressed throughout  Plantars: mute  Cerebellar: Normal finger-to-nose,    Lab Results: Results for orders placed during the hospital encounter of 01/23/12 (from the past 24 hour(s))  GLUCOSE, CAPILLARY      Status: Abnormal   Collection Time   01/28/12 12:31 PM      Component Value Range   Glucose-Capillary 211 (*) 70 - 99 (mg/dL)   Comment 1 Notify RN    GLUCOSE, CAPILLARY     Status: Abnormal   Collection Time   01/28/12  4:55 PM      Component Value Range   Glucose-Capillary 200 (*) 70 - 99 (mg/dL)  GLUCOSE, CAPILLARY     Status: Abnormal   Collection Time   01/28/12  8:35 PM      Component Value Range   Glucose-Capillary 247 (*) 70 - 99 (mg/dL)   Comment 1 Notify RN     Comment 2 Documented in Chart    GLUCOSE, CAPILLARY     Status: Abnormal   Collection Time   01/29/12  7:54 AM      Component Value Range   Glucose-Capillary 181 (*) 70 - 99 (mg/dL)    Lipid Panel No results found for this basename: CHOL,TRIG,HDL,CHOLHDL,VLDL,LDLCALC in the last 72 hours  Studies/Results: No results found.  Medications:     Scheduled:   . amLODipine  10 mg Oral Daily  . antiseptic oral rinse  15 mL Mouth Rinse q12n4p  . benazepril  10 mg Oral Daily  . chlorhexidine  15 mL Mouth Rinse BID  .  enoxaparin  40 mg Subcutaneous Q24H  . feeding supplement  1 Container Oral TID BM  . insulin aspart  0-15 Units Subcutaneous TID WC & HS  . insulin glargine  18 Units Subcutaneous QHS  . ketorolac  1 drop Both Eyes QID  . levETIRAcetam  500 mg Oral BID  . moxifloxacin  400 mg Oral q1800  . pantoprazole  40 mg Oral Q1200  . sertraline  50 mg Oral Daily  . DISCONTD: insulin aspart  0-15 Units Subcutaneous Q4H  . DISCONTD: insulin glargine  16 Units Subcutaneous QHS  . DISCONTD: levetiracetam  500 mg Intravenous Q12H  . DISCONTD: moxifloxacin  400 mg Intravenous Q24H   EEG: This routine EEG done with the patient awake is abnormal. The slowing seen over the right hemisphere, worse in the posterior right quadrant suggest an underlying functional or structural abnormality in that location. No interictal epileptiform discharges were seen.  Assessment/Plan:   Patient Active Hospital Problem  List:  Altered mental status (01/25/2012) Assessment: clearing, now able to follow commands, AOX3 No acute abnormalities on head CT and MR imaging.   Seizure (01/25/2012) Assessment: No further seizures. EEG shows slowing on the right hemisphere but no epileptiform discharge. Plan: As dementia and and infection lower seizure threshold, would continue Keppra 500 mg BID, and have follow up with out patient neurology to further asses seizures and Keppra dosing.   Neurology will sign off.     Etta Quill PA-C [facility] [phone]  01/29/2012, 10:39 AM

## 2012-01-29 NOTE — Discharge Summary (Signed)
Patient ID: BLAKLEY CASSATA MRN: [id number] DOB/AGE: August 27, 1941 71 y.o.  Admit date: 01/23/2012 Discharge date: 01/29/2012  Primary Care Physician:  Tula Nakayama, MD, MD Discharge Diagnoses:    Principal Problem: *Altered Mental Status *Seizure likely secondary to Xanax withdraw  *Acute respiratory failure with hypoxia status post intubation Active Problems:  IDDM  HYPERLIPIDEMIA  OBESITY  ANEMIA  Headache  Vomiting  Dehydration  Generalized weakness  Orthostatic hypotension  Medication List  As of 01/29/2012  1:13 PM   STOP taking these medications         ALPRAZolam 0.5 MG tablet      cyclobenzaprine 10 MG tablet      diclofenac 75 MG EC tablet      HYDROcodone-acetaminophen 5-325 MG per tablet      hydrOXYzine 25 MG tablet      lovastatin 40 MG tablet      UNABLE TO FIND         TAKE these medications         amLODipine 10 MG tablet   Commonly known as: NORVASC   Take 1 tablet (10 mg total) by mouth daily.      aspirin 81 MG tablet   Take 81 mg by mouth daily.      atorvastatin 40 MG tablet   Commonly known as: LIPITOR   Take 1 tablet (40 mg total) by mouth daily.      B-D INS SYR ULTRAFINE 1CC/30G 30G X 1/2" 1 ML Misc   Generic drug: Insulin Syringe-Needle U-100   USE AS DIRECTED      benazepril 20 MG tablet   Commonly known as: LOTENSIN   TAKE ONE TABLET BY MOUTH EVERY DAY      clotrimazole-betamethasone cream   Commonly known as: LOTRISONE   Apply to affected area 2 times daily      furosemide 40 MG tablet   Commonly known as: LASIX   Take 1 tablet (40 mg total) by mouth 2 (two) times daily.      gabapentin 300 MG capsule   Commonly known as: NEURONTIN   Take 1 capsule (300 mg total) by mouth 3 (three) times daily.      glucose blood test strip   Three times daily testing   DX 250.00      insulin lispro protamine-insulin lispro (75-25) 100 UNIT/ML Susp   Commonly known as: HUMALOG 75/25   Inject into the skin. Per sliding scale      ketorolac 0.4 % Soln   Commonly known as: ACULAR   Take 1 drop by mouth 4 times daily.      levETIRAcetam 500 MG tablet   Commonly known as: KEPPRA   Take 1 tablet (500 mg total) by mouth 2 (two) times daily.      multivitamin per tablet   Take 1 tablet by mouth daily.      Naproxen-Esomeprazole 375-20 MG Tbec   Take 1 tablet by mouth 2 (two) times daily.      onetouch ultrasoft lancets   Use as instructed THREE TIMES DAILY   Dx 250.00      sertraline 50 MG tablet   Commonly known as: ZOLOFT   Take 1 tablet (50 mg total) by mouth daily. Three tabs daily      VICTOZA 18 MG/3ML Soln   Generic drug: Liraglutide   Inject into the skin Daily.      vitamin B-12 500 MCG tablet   Commonly known as: CYANOCOBALAMIN   Take 500  mcg by mouth daily.            Consults: Neurology  Brief H and P: From the admission note:  This is a 71 y/o female who presents to the emergency room with headache, dizziness, generalized weakness and vomiting. Patient's family reports that they had noticed a change in her condition approximately 4-5 days ago. They had noticed some generalized weakness and dizziness while walking. Approximately 2 days ago, the patient began to have persistent vomiting. Her po intake has been very poor.   Originally the patient was admitted to [facility].  At the family's request she was transferred to [facility].  On 5/3 the patient developed a low grade fever.  Later in the morning she had an episode of projectile vomiting.  In the afternoon the patient lost consciousness and experienced tonic clonic seizure.  The developed respiratory distress, was intubated and moved to the ICU under critical care's service.   It was felt her seizure was due to Xanax withdraw.  1.  Vomiting and Diarrhea.  Had been going on for three days prior to admission.  Was resolved at the time of admission.    2.  Altered Mental Status in a patient with baseline dementia. When she  arrived at [facility] the patient was altered to the point where she did not recognize her family, she could not speak intelligibly, and she could not follow commands.    No sign of infection was found.  CT head and MRI of the brain were both negative for acute changes.  AMS was felt to have multiple etiologies:  A.  The patient had had a recent fall and had been started on percocet for pain  B.  The patient took Xanax .5 three times a day.  C.  Polypharmacy (Xanax, percocet, flexeril, Atarax etc) on multiple medications that can cause confusion or sedation.  D.  Recent viral illness At the time of discharge the patient is alert and orientated times 3.  She is interacting with her family, conversational with the medical staff and able to follow all commands.  Her Xanax, percocet and multiple other medications have been discontinued.  3.  Seizure.  This occurred in the hospital on 01/25/12.  The patient seized, developed respiratory distress and required intubation to protect her airway.  She was monitored closely in the ICU.  Again head Ct and MR brain were negative.  EEG was done and found to have abnormal slowing.  Neurology was consulted.  They started Keppra and have decided to continue it until the patient has Neuro follow up.  4.  IDDM.  The patient's Hbg A1C is greater than 13, suggesting non-compliance with her insulin and victoza medications at home. Her primary care physician may consider simplifying the patient's insulin regimen.  In the hospital her CBGs ranged between 150 - 250 on Lantus 18 units at bedtime and Sliding Scale novolog during the day.  5.  Hypertension.  The patient's blood pressure was poorly controlled. Her Norvasc was increased and blood pressures improved to within normal limits.  6.  Depression / Anxiety.  Xanax was discontinued.  Zoloft was continued and patient and will be prescribed at discharge.      Physical Exam on Discharge: General appearance: alert, cooperative,  appears older than stated age, distracted and no distress  Resp: clear to auscultation bilaterally, no w/c/r Cardio: regular rate and rhythm, S1, S2 normal, no murmur, click, rub or gallop  GI: soft, non-tender; bowel  sounds normal; no masses, no organomegaly  Extremities: extremities normal, atraumatic, no cyanosis or edema  Neurologic: Grossly normal, slightly slow to respond.  This is likely her baseline.   Filed Vitals:   01/28/12 1200 01/28/12 1319 01/28/12 2056 01/29/12 0541  BP:  160/79 129/70 132/73  Pulse:  73 65 67  Temp: 98.4 F (36.9 C) 98.1 F (36.7 C) 98.2 F (36.8 C) 98.2 F (36.8 C)  TempSrc: Oral Oral Oral Oral  Resp:  20 20 18   Height:      Weight:    119.2 kg (262 lb 12.6 oz)  SpO2:  98% 97% 98%     Intake/Output Summary (Last 24 hours) at 01/29/12 1313 Last data filed at 01/29/12 1139  Gross per 24 hour  Intake    340 ml  Output   2727 ml  Net  -2387 ml    Basic Metabolic Panel:  Lab 0000000 0435 01/27/12 0350 01/25/12 1504  NA 136 134* --  K 4.0 3.3* --  CL 101 99 --  CO2 27 25 --  GLUCOSE 152* 186* --  BUN 9 10 --  CREATININE 0.81 0.75 --  CALCIUM 9.0 8.6 --  MG -- -- 1.6  PHOS -- -- --   Liver Function Tests:  Lab 01/26/12 0450 01/25/12 1541  AST 30 28  ALT 16 19  ALKPHOS 92 122*  BILITOT 0.5 0.5  PROT 6.4 7.9  ALBUMIN 3.2* 3.8    Lab 01/23/12 1830  LIPASE 29  AMYLASE --    Lab 01/25/12 1505 01/24/12 1751  AMMONIA 31 27   CBC:  Lab 01/28/12 0435 01/27/12 0350 01/23/12 1430  WBC 7.1 9.1 --  NEUTROABS -- -- 3.8  HGB 10.0* 9.9* --  HCT 31.2* 31.2* --  MCV 65.7* 65.8* --  PLT 179 186 --   CBG:  Lab 01/29/12 1156 01/29/12 0754 01/28/12 2035 01/28/12 1655 01/28/12 1231 01/28/12 0835  GLUCAP 357* 181* 247* 200* 211* 152*   Hemoglobin A1C:  Lab 01/25/12 1504  HGBA1C 13.3*   Thyroid Function Tests:  Lab 01/23/12 1830  TSH 0.520  T4TOTAL --  FREET4 --  T3FREE --  THYROIDAB --  Anemia Panel:  Lab 01/25/12  1504  VITAMINB12 347  FOLATE 17.0  FERRITIN --  TIBC --  IRON --  RETICCTPCT --     Significant Diagnostic Studies:   01/28/12 EEG CLINICAL INTERPRETATION: This routine EEG done with the patient awake  is abnormal. The slowing seen over the right hemisphere, worse in the  posterior right quadrant suggest an underlying functional or structural  abnormality in that location. No interictal epileptiform discharges  were seen.    Dg Abd 1 View  01/25/2012  *RADIOLOGY REPORT*  Clinical Data: 71 year old female obstruction, ileus.  ABDOMEN - 1 VIEW  Comparison: CT abdomen pelvis 03/30/2011 and earlier.  Findings: Portable supine AP views of the abdomen pelvis 1840 hours.  Respiratory motion. Nonobstructed bowel gas pattern. Grossly negative lung bases.  Lower thoracic compression fracture at T10.  T11 may also be compressed.   Otherwise no acute osseous abnormality.  Pelvic phleboliths.  No definite pneumoperitoneum on these supine views.  IMPRESSION: 1.  Respiratory motion. Nonobstructed bowel gas pattern. 2.  Age indeterminate thoracic compression fractures, new since July 2012.  Per CMS PQRS reporting requirements (PQRS Measure 24): Given the patient's age of greater than 72 and the fracture site (hip, distal radius, or spine), the patient should be tested for osteoporosis using DXA, and the appropriate  treatment considered based on the DXA results.  Original Report Authenticated By: Randall An, M.D.   Ct Head Wo Contrast  01/23/2012  *RADIOLOGY REPORT*  Clinical Data: [place] 2 weeks ago.  Persistent right-sided pain.  CT HEAD WITHOUT CONTRAST  Technique:  Contiguous axial images were obtained from the base of the skull through the vertex without contrast.  Comparison: 11/23/2010  Findings: The brain shows age related atrophy.  There are chronic small vessel changes within the hemispheric deep white matter.  No evidence of acute or subacute infarction, mass lesion, hemorrhage, hydrocephalus or  extra-axial collection.  No fluid in the sinuses, middle ears or mastoids.  No skull fracture.  There is atherosclerotic calcification of the major vessels at the base of the brain.  IMPRESSION: No acute or reversible findings.  No traumatic finding.  Atrophy and chronic small vessel disease.  Original Report Authenticated By: Jules Schick, M.D.   Mr Brain Wo Contrast  01/24/2012  *RADIOLOGY REPORT*  Clinical Data: Recent episodes of confusion with headache.  High blood pressure, diabetes and hyperlipidemia.  MRI HEAD WITHOUT CONTRAST  Technique:  Multiplanar, multiecho pulse sequences of the brain and surrounding structures were obtained according to standard protocol without intravenous contrast.  Comparison: 01/23/2012 head CT.  11/23/2010 MR.  Findings: No acute infarct.  No intracranial hemorrhage.  Moderate small vessel disease type changes.  Global atrophy without hydrocephalus.  No intracranial mass lesion noted on this unenhanced motion degraded exam.  Major intracranial vascular structures are patent.  Flow artifact caused by hyperdynamic flow vertebral basilar system.  Mild exophthalmos.  IMPRESSION: No acute infarct.  Moderate small vessel disease type changes.  Global atrophy without hydrocephalus.  Tortuous vertebral arteries and basilar artery.  Original Report Authenticated By: Doug Sou, M.D.   Dg Chest Port 1 View  01/26/2012  *RADIOLOGY REPORT*  Clinical Data: Endotracheal tube removal.  PORTABLE CHEST - 1 VIEW  Comparison: 01/25/2012.  Findings: The endotracheal tube has been removed.  The heart is mildly enlarged but stable.  Low lung volumes with vascular crowding and bibasilar atelectasis.  Mild interstitial prominence but no overt pulmonary edema or pleural effusions.  IMPRESSION:  1.  Removal of endotracheal tube. 2.  Low lung volumes with vascular crowding and basilar atelectasis.  Original Report Authenticated By: P. Kalman Jewels, M.D.   Dg Chest Port 1 View  01/25/2012   *RADIOLOGY REPORT*  Clinical Data: Respiratory distress.  Seizure.  PORTABLE CHEST - 1 VIEW  Comparison: 01/11/2012.  Findings: Endotracheal tube is in place at the level of the carina directed towards the right mainstem bronchus.  Recommend retracting by 2.5 to 3 cm.  Radiopaque structure adjacent to the endotracheal tube may be related to such. Clinical correlation recommended.  Pulmonary edema most notable centrally.  Poor inspiration. Cardiomegaly.  No gross pneumothorax.  No segmental consolidation. Mild elevation left hemidiaphragm.  Calcified slightly tortuous aorta.  IMPRESSION: Endotracheal tube is in place at the level of the carina directed towards the right mainstem bronchus.  Recommend retracting by 2.5 to 3 cm.  Radiopaque structure adjacent to the endotracheal tube may be related to such. Clinical correlation recommended.  Pulmonary edema most notable centrally.  Cardiomegaly.  Critical Value/emergent results were called by telephone at the time of interpretation on 05/032013  at 4:02 p.m.  to  [facility] the patient's nurse ([id number])., who verbally acknowledged these results.  Original Report Authenticated By: Doug Sou, M.D.   Dg Abd Portable 1v  01/26/2012  *  RADIOLOGY REPORT*  Clinical Data: Feeding tube placement.  PORTABLE ABDOMEN - 1 VIEW 01/26/2012 1243 hours:  Comparison: Portable abdomen x-ray yesterday.  Findings: Feeding tube courses through the stomach with its tip in the proximal descending duodenum.  Visualized bowel gas pattern unremarkable, with gas in upper normal caliber transverse colon and proximal descending colon.  Numerous pelvic phleboliths.  IMPRESSION: Feeding tube tip in the proximal portion of the descending duodenum.  Original Report Authenticated By: Deniece Portela, M.D.   US Abdomen Limited Ruq  01/24/2012  *RADIOLOGY REPORT*  Clinical Data: Vomiting  LIMITED ABDOMINAL ULTRASOUND  Comparison:  None.  Findings: The patient is status post cholecystectomy.  Common  bile duct demonstrates a width of 8.4 mm and this is within acceptable limits for a post cholecystectomy patient.  The liver parenchyma appears homogeneous with no definite focal parenchymal abnormality or intrahepatic ductal dilatation.  The hepatic length measures approximately 18 cm in the midclavicular line and suggests hepatomegaly sonographically with a length exceeding 16 cm typically associated with hepatic enlargement.  No right upper quadrant fluid is seen.  IMPRESSION: Prominent hepatic size measuring 18 centimeters in the midclavicular line.  No focal hepatic abnormality suggested  Original Report Authenticated By: Ander Gaster, M.D.      Disposition and Follow-up: Stable for discharge to home in care of her family with home health physical therapy as well as primary care and neurology followup.   The patient's primary problem was felt to be related to polypharmacy and accidental medication mis-use.  Stricter medication management is recommended. In the hospital followup for his doctors in the  Discharge Orders    Future Appointments: Provider: Department: Dept Phone: Center:   02/04/2012 10:00 AM Fayrene Helper, [place] [id number] [facility]   04/15/2012 10:00 AM Fayrene Helper, [place] [facility]   05/19/2012 9:00 AM Hayden Pedro, MD [facility] [phone] None     Future Orders Please Complete By Expires   Diet - low sodium heart healthy      Increase activity slowly        Follow-up Information    Follow up with Tula Nakayama, MD on 02/04/2012. (Monday at 10 am)    Contact information:   [address], [address] [place] [phone]       Follow up with Neena Rhymes, MD. Schedule an appointment as soon as possible for a visit in 2 weeks.   Contact information:   [facility] [place] [phone]           Time spent on Discharge: 40  min.  SignedMelton Alar 01/29/2012, 1:13 PM [phone]

## 2012-01-29 NOTE — Progress Notes (Signed)
Inpatient Diabetes Program Recommendations  AACE/ADA: New Consensus Statement on Inpatient Glycemic Control (2009)  Target Ranges:  Prepandial:   less than 140 mg/dL      Peak postprandial:   less than 180 mg/dL (1-2 hours)      Critically ill patients:  140 - 180 mg/dL   Reason for Visit: Results for Laura Atkins, Laura Atkins (MRN [id number]) as of 01/29/2012 12:28  Ref. Range 01/28/2012 12:31 01/28/2012 16:55 01/28/2012 20:35 01/29/2012 07:54 01/29/2012 11:56  Glucose-Capillary Latest Range: 70-99 mg/dL 211 (H) 200 (H) 247 (H) 181 (H) 357 (H)   Note post-prandial CBG's elevated.  Consider adding Novolog meal coverage 3 units tid with meals.  Will follow.

## 2012-01-29 NOTE — Progress Notes (Signed)
PT Note  Pt in the midst of being [place] home with family assistance. Thanks.  [employer] PT [phone]

## 2012-01-31 LAB — CULTURE, BLOOD (ROUTINE X 2)
Culture  Setup Time: 201305032230
Culture  Setup Time: 201305032230
Culture: NO GROWTH
Culture: NO GROWTH

## 2012-02-01 ENCOUNTER — Ambulatory Visit (INDEPENDENT_AMBULATORY_CARE_PROVIDER_SITE_OTHER): Payer: Medicare Other | Admitting: Neurology

## 2012-02-01 ENCOUNTER — Encounter: Payer: Self-pay | Admitting: Neurology

## 2012-02-01 VITALS — BP 122/62 | HR 92 | Wt 267.0 lb

## 2012-02-01 DIAGNOSIS — F19239 Other psychoactive substance dependence with withdrawal, unspecified: Secondary | ICD-10-CM

## 2012-02-01 NOTE — Progress Notes (Signed)
Dear Dr. Moshe Cipro,  Thank you for having me see Laura Atkins in consultation today at [facility] for her problem with seizures and altered mental status.  As you may recall, she is a 71 y.o. year old female with a history of dementia, chronic back pain and radicular-like leg pain who presented with headache, dizziness and feeling generally unwell on 01/23/2012.  In retrospect it sounds like she had stopped her Xanax as she had run out sometime before this but was unable to get it refilled. Interestingly her 86 office said it sounded like she was going through withdrawal.  She then had two seizures during admission.  It was felt that these were likely from withdrawal.  EEG revealed right hemisphere posterior quadrant slowing.  MRI brain revealed global atrophy.   Infectious workup was negative.  She was placed on Keppra on discharge and has been slowly improving.  However, she seems still mentally slow.  She had her other medications stopped that may have interfered with her mentation, Flexeril, sertraline and gabapentin.  Her headache has slowly improved as well.  Past Medical History  Diagnosis Date  . Hypertension   . Diabetes mellitus   . Hyperlipidemia   . Obesity   . Anemia   . Anxiety   . Depression   . GERD (gastroesophageal reflux disease)   . Pruritus   . Osteoarthritis   . Right shoulder pain   . Left knee pain   . Hypersomnia   . Urinary incontinence     Past Surgical History  Procedure Date  . Abdominal hysterectomy   . Cyst removed left breast   . Cholecystectomy     History   Social History  . Marital Status: Married    Spouse Name: N/A    Number of Children: N/A  . Years of Education: N/A   Social History Main Topics  . Smoking status: Never Smoker   . Smokeless tobacco: Never Used  . Alcohol Use: No  . Drug Use: No  . Sexually Active: Yes    Birth Control/ Protection: Surgical   Other Topics Concern  . None   Social History  Narrative  . None    No family history on file.  Current Outpatient Prescriptions on File Prior to Visit  Medication Sig Dispense Refill  . amLODipine (NORVASC) 10 MG tablet Take 1 tablet (10 mg total) by mouth daily.  30 tablet  5  . aspirin 81 MG tablet Take 81 mg by mouth daily.        Marland Kitchen atorvastatin (LIPITOR) 40 MG tablet Take 1 tablet (40 mg total) by mouth daily.  30 tablet  0  . B-D INS SYR ULTRAFINE 1CC/30G 30G X 1/2" 1 ML MISC USE AS DIRECTED  100 each  2  . benazepril (LOTENSIN) 20 MG tablet TAKE ONE TABLET BY MOUTH EVERY DAY  90 tablet  2  . furosemide (LASIX) 40 MG tablet Take 1 tablet (40 mg total) by mouth 2 (two) times daily.  45 tablet  5  . gabapentin (NEURONTIN) 300 MG capsule Take 1 capsule (300 mg total) by mouth 3 (three) times daily.  90 capsule  5  . glucose blood (ONE TOUCH ULTRA TEST) test strip Three times daily testing  DX 250.00  100 each  11  . insulin lispro protamine-insulin lispro (HUMALOG 75/25) (75-25) 100 UNIT/ML SUSP Inject into the skin. Per sliding scale       . ketorolac (ACULAR) 0.4 % SOLN Take 1  drop by mouth 4 times daily.      . Lancets (ONETOUCH ULTRASOFT) lancets Use as instructed THREE TIMES DAILY  Dx 250.00  100 each  12  . levETIRAcetam (KEPPRA) 500 MG tablet Take 1 tablet (500 mg total) by mouth 2 (two) times daily.  60 tablet  0  . multivitamin (THERAGRAN) per tablet Take 1 tablet by mouth daily.        . Naproxen-Esomeprazole (VIMOVO) 375-20 MG TBEC Take 1 tablet by mouth 2 (two) times daily.  60 tablet  1  . sertraline (ZOLOFT) 50 MG tablet Take 1 tablet (50 mg total) by mouth daily. Three tabs daily  90 tablet  2  . VICTOZA 18 MG/3ML SOLN Inject into the skin Daily.      . vitamin B-12 (CYANOCOBALAMIN) 500 MCG tablet Take 500 mcg by mouth daily.          Allergies  Allergen Reactions  . Penicillins Shortness Of Breath, Itching and Rash  . Prednisone Shortness Of Breath, Itching and Rash  . Propoxyphene-Acetaminophen Itching and  Nausea And Vomiting      ROS:  13 systems were reviewed and are notable for chronic lower back pain and pain shooting into the right lower leg.  All other review of systems are unremarkable.   Examination:  Filed Vitals:   02/01/12 1324  BP: 122/62  Pulse: 92  Weight: 267 lb (121.11 kg)     In general, tired appearing women.  Psychomotor slowing.  Cardiovascular: The patient has a regular rate and rhythm and no carotid bruits.  Fundoscopy:  Disks are flat. Vessel caliber within normal limits.  Mental status:   3/5 Time orientation, 4/5 place orientation.  Could not concentrate to spell Laura Atkins backwards.  Cranial Nerves: Pupils are equally round and reactive to light. Visual fields full to confrontation. Extraocular movements are intact without nystagmus. Facial sensation and muscles of mastication are intact. Muscles of facial expression are symmetric. Hearing intact to bilateral finger rub. Tongue protrusion, uvula, palate midline.  Shoulder shrug intact  Motor:  The patient has normal bulk and tone, no pronator drift.  There are no adventitious movements.  5/5 muscle strength bilaterally.  Reflexes:  Quiety  Toes down  Coordination:  Normal finger to nose.  No dysdiadokinesia.  Sensation is intact to light touch.  Gait and Station are antalgic.  Romberg is negative   Impression/Recs: 1.  Seizures - likely from withdrawal of Xanax.  However, now that she is well beyond the withdrawal period she can continue off of it.  We will continue the [place] for now but I will likely wean this in the future. 2.  AMS - She certainly has cognitive slowing.  I don't think this is just her underlying dementia.  My only explanation at this time is starting the [place].  I expect she will continue to improve.  If she is not back at baseline in 2 weeks I have told them to call me and we will consider a withdrawal of the Keppra. 3.  Dementia - unknown cause.  Will consider workup when she  is back to baseline. 4.  Difficulty walking - likely pain mediated, again will consider further workup when she is able to participate more in the exam.  We will see the patient back in 2 months.  Thank you for having [place] see Laura Atkins in consultation.  Feel free to contact me with any questions.  Kavin Leech Jacelyn Grip, MD [facility], [place] [address], [place] [address] Phone: [phone] Fax: [phone].

## 2012-02-04 ENCOUNTER — Encounter: Payer: Self-pay | Admitting: Family Medicine

## 2012-02-04 ENCOUNTER — Ambulatory Visit (INDEPENDENT_AMBULATORY_CARE_PROVIDER_SITE_OTHER): Payer: Medicare Other | Admitting: Family Medicine

## 2012-02-04 VITALS — BP 142/66 | HR 68 | Resp 18 | Ht 67.5 in | Wt 268.1 lb

## 2012-02-04 DIAGNOSIS — F329 Major depressive disorder, single episode, unspecified: Secondary | ICD-10-CM

## 2012-02-04 DIAGNOSIS — F3289 Other specified depressive episodes: Secondary | ICD-10-CM

## 2012-02-04 DIAGNOSIS — R569 Unspecified convulsions: Secondary | ICD-10-CM

## 2012-02-04 DIAGNOSIS — I10 Essential (primary) hypertension: Secondary | ICD-10-CM

## 2012-02-04 DIAGNOSIS — E109 Type 1 diabetes mellitus without complications: Secondary | ICD-10-CM

## 2012-02-04 NOTE — Assessment & Plan Note (Signed)
Med managemnt by psychiatry only

## 2012-02-04 NOTE — Assessment & Plan Note (Signed)
Controlled, no change in medication  

## 2012-02-04 NOTE — Assessment & Plan Note (Signed)
New witnessed seizure being followed by neurology, currently on kepra, no seizures since d/c

## 2012-02-04 NOTE — Patient Instructions (Signed)
F/u in 4 to 5 weeks.  Please call if you need need help.  [facility]   Keep appt with Dr Dorris Fetch in am.  Continue meds as at hospital discharge  Check  Blood sugars regularly

## 2012-02-04 NOTE — Assessment & Plan Note (Signed)
Uncontrolled with marked fluctuation in blood sugars , has endo f/u in am

## 2012-02-04 NOTE — Progress Notes (Signed)
  Subjective:    Patient ID: Laura Atkins, female    DOB: 03-Sep-1941, 71 y.o.   MRN: [id number]  HPI Pt in for f/u from recent hospitalization when , per family report she was in a confused state, not recognizing family members at times, extreme upper and lower extremity weakness, 2 witnessed seizures, and reportedly became totally unresponsive requiring ressuccitation in the hospital after several days. Neurologic eval to date is negative for acute insult, she has  Already established with and  f/u with a neurologist in [place]. There is a question as to whether some of her presentation was due to benzo withdrawal, also family reports at times blood sugar was "too high to read " at home, and since being home and on better dietary restriction at times it has been in the 40's. Pt states she is unable to recognize houses on the road she has lived on for years , has poor recall of all the events and feels somewhat in a daze. Her children are providing support and watching her 24 hours per day. Nurse stated 1 daughter stated she "had been told" some of the problem was from over medication, so I spent a considerable time reviewing current medication, responsible providers and also referring for home health She [place] is regularly followed by psychiatry, endocrinology and now neurology. No changes have been made at this visit to the medication sshe was discharged on, and the importance of regulalrly testing BG was stressed, she has appt with endo in am   Review of Systems See HPI Denies recent fever or chills. Denies sinus pressure, nasal congestion, ear pain or sore throat. Denies chest congestion, productive cough or wheezing. Denies chest pains, palpitations and leg swelling Denies abdominal pain, nausea, vomiting,diarrhea or constipation.   Denies dysuria, frequency, hesitancy or incontinence. Denies headaches, seizures, numbness, or tingling. Denies depression, anxiety or insomnia. Denies  skin break down or rash.        Objective:   Physical Exam Patient alert, somewhat dazed/somnolent, and in no cardiopulmonary distress.  HEENT: No facial asymmetry, EOMI, no sinus tenderness,  oropharynx pink and moist.  Neck supple no adenopathy.  Chest: Clear to auscultation bilaterally.  CVS: S1, S2 no murmurs, no S3.  ABD: Soft non tender. Bowel sounds normal.  Ext: No edema  MS: Adequate though reduced  ROM spine, shoulders, hips and knees.  Skin: Intact, no ulcerations or rash noted.  Psych: Good eye contact,blunted  affect. Memory loss not anxious or depressed appearing.  CNS: CN 2-12 intact, power, tone and sensation normal throughout.       Assessment & Plan:

## 2012-02-07 ENCOUNTER — Ambulatory Visit (HOSPITAL_COMMUNITY): Payer: Medicare Other | Admitting: Psychiatry

## 2012-02-07 ENCOUNTER — Telehealth: Payer: Self-pay | Admitting: Family Medicine

## 2012-02-11 NOTE — Telephone Encounter (Signed)
I spoke with them last week regarding them being short handed and if it was ok to wait a few days before going out to see her. Per Dr Moshe Cipro, that was fine

## 2012-02-12 ENCOUNTER — Encounter: Payer: Medicare Other | Admitting: Family Medicine

## 2012-02-14 ENCOUNTER — Ambulatory Visit (INDEPENDENT_AMBULATORY_CARE_PROVIDER_SITE_OTHER): Payer: Medicare Other | Admitting: Ophthalmology

## 2012-02-19 DIAGNOSIS — M6281 Muscle weakness (generalized): Secondary | ICD-10-CM

## 2012-02-19 DIAGNOSIS — IMO0001 Reserved for inherently not codable concepts without codable children: Secondary | ICD-10-CM

## 2012-02-19 DIAGNOSIS — R269 Unspecified abnormalities of gait and mobility: Secondary | ICD-10-CM

## 2012-02-19 DIAGNOSIS — G40909 Epilepsy, unspecified, not intractable, without status epilepticus: Secondary | ICD-10-CM

## 2012-02-21 ENCOUNTER — Telehealth: Payer: Self-pay

## 2012-02-21 NOTE — Telephone Encounter (Signed)
Debby (PT) from [facility] called in and said that she is needing an order for a "3 in 1" shower chair. She said all you have to write on the rx is 3 in 1 and diagnosis code and fax to [facility].

## 2012-02-21 NOTE — Telephone Encounter (Signed)
rx written pls fax to [facility]

## 2012-02-22 NOTE — Telephone Encounter (Signed)
Faxed

## 2012-02-25 ENCOUNTER — Other Ambulatory Visit: Payer: Self-pay | Admitting: Neurology

## 2012-02-25 MED ORDER — LEVETIRACETAM 500 MG PO TABS: 500.0000 mg | ORAL_TABLET | Freq: Two times a day (BID) | ORAL | Status: DC

## 2012-02-28 ENCOUNTER — Encounter (HOSPITAL_COMMUNITY): Payer: Self-pay | Admitting: *Deleted

## 2012-02-28 ENCOUNTER — Telehealth: Payer: Self-pay | Admitting: Family Medicine

## 2012-02-28 ENCOUNTER — Emergency Department (HOSPITAL_COMMUNITY)
Admission: EM | Admit: 2012-02-28 | Discharge: 2012-02-29 | Disposition: A | Discharge status: Home / Self Care | Payer: Medicare Other | Attending: Emergency Medicine | Admitting: Emergency Medicine

## 2012-02-28 DIAGNOSIS — N390 Urinary tract infection, site not specified: Secondary | ICD-10-CM | POA: Insufficient documentation

## 2012-02-28 DIAGNOSIS — D649 Anemia, unspecified: Secondary | ICD-10-CM | POA: Insufficient documentation

## 2012-02-28 DIAGNOSIS — F411 Generalized anxiety disorder: Secondary | ICD-10-CM | POA: Insufficient documentation

## 2012-02-28 DIAGNOSIS — E785 Hyperlipidemia, unspecified: Secondary | ICD-10-CM | POA: Insufficient documentation

## 2012-02-28 DIAGNOSIS — E669 Obesity, unspecified: Secondary | ICD-10-CM | POA: Insufficient documentation

## 2012-02-28 DIAGNOSIS — F489 Nonpsychotic mental disorder, unspecified: Secondary | ICD-10-CM | POA: Insufficient documentation

## 2012-02-28 DIAGNOSIS — E119 Type 2 diabetes mellitus without complications: Secondary | ICD-10-CM | POA: Insufficient documentation

## 2012-02-28 DIAGNOSIS — K219 Gastro-esophageal reflux disease without esophagitis: Secondary | ICD-10-CM | POA: Insufficient documentation

## 2012-02-28 DIAGNOSIS — F439 Reaction to severe stress, unspecified: Secondary | ICD-10-CM

## 2012-02-28 DIAGNOSIS — F329 Major depressive disorder, single episode, unspecified: Secondary | ICD-10-CM | POA: Insufficient documentation

## 2012-02-28 DIAGNOSIS — M1990 Unspecified osteoarthritis, unspecified site: Secondary | ICD-10-CM | POA: Insufficient documentation

## 2012-02-28 DIAGNOSIS — I10 Essential (primary) hypertension: Secondary | ICD-10-CM | POA: Insufficient documentation

## 2012-02-28 DIAGNOSIS — F3289 Other specified depressive episodes: Secondary | ICD-10-CM | POA: Insufficient documentation

## 2012-02-28 DIAGNOSIS — F32A Depression, unspecified: Secondary | ICD-10-CM

## 2012-02-28 DIAGNOSIS — G4710 Hypersomnia, unspecified: Secondary | ICD-10-CM | POA: Insufficient documentation

## 2012-02-28 LAB — ACETAMINOPHEN LEVEL: Acetaminophen (Tylenol), Serum: <15 ug/mL (ref 10–30)

## 2012-02-28 LAB — BASIC METABOLIC PANEL
BUN: 27 mg/dL — ABNORMAL HIGH (ref 6–23)
CO2: 28 mEq/L (ref 19–32)
Calcium: 10.3 mg/dL (ref 8.4–10.5)
Chloride: 98 mEq/L (ref 96–112)
Creatinine, Ser: 0.91 mg/dL (ref 0.50–1.10)
GFR calc Af Amer: 73 mL/min — ABNORMAL LOW (ref >90)
GFR calc non Af Amer: 63 mL/min — ABNORMAL LOW (ref >90)
Glucose, Bld: 209 mg/dL — ABNORMAL HIGH (ref 70–99)
Potassium: 3.9 mEq/L (ref 3.5–5.1)
Sodium: 139 mEq/L (ref 135–145)

## 2012-02-28 LAB — DIFFERENTIAL
Basophils Absolute: 0 10*3/uL (ref 0.0–0.1)
Basophils Relative: 0 % (ref 0–1)
Eosinophils Absolute: 0.3 10*3/uL (ref 0.0–0.7)
Eosinophils Relative: 4 % (ref 0–5)
Lymphocytes Relative: 44 % (ref 12–46)
Lymphs Abs: 3.1 10*3/uL (ref 0.7–4.0)
Monocytes Absolute: 0.4 10*3/uL (ref 0.1–1.0)
Monocytes Relative: 6 % (ref 3–12)
Neutro Abs: 3.3 10*3/uL (ref 1.7–7.7)
Neutrophils Relative %: 46 % (ref 43–77)

## 2012-02-28 LAB — CBC
HCT: 34.8 % — ABNORMAL LOW (ref 36.0–46.0)
Hemoglobin: 10.7 g/dL — ABNORMAL LOW (ref 12.0–15.0)
MCH: 21.3 pg — ABNORMAL LOW (ref 26.0–34.0)
MCHC: 30.7 g/dL (ref 30.0–36.0)
MCV: 69.2 fL — ABNORMAL LOW (ref 78.0–100.0)
Platelets: 225 10*3/uL (ref 150–400)
RBC: 5.03 MIL/uL (ref 3.87–5.11)
RDW: 15.4 % (ref 11.5–15.5)
WBC: 7.1 10*3/uL (ref 4.0–10.5)

## 2012-02-28 LAB — ETHANOL: Alcohol, Ethyl (B): <11 mg/dL (ref 0–11)

## 2012-02-28 LAB — SALICYLATE LEVEL: Salicylate Lvl: <2 mg/dL — ABNORMAL LOW (ref 2.8–20.0)

## 2012-02-28 NOTE — Telephone Encounter (Signed)
Daughter aware.

## 2012-02-28 NOTE — ED Notes (Addendum)
Pt here on IVC. Pt denies any of the allocations. Pt alert & answering questions correctly. Pt notes there are some family issues going on at this time. Family in waiting room, pt not wanting them back at this time.

## 2012-02-28 NOTE — Telephone Encounter (Signed)
pls let caller  know the best way to get admitted to a nursing home is through the hospital, espescialy if patient's behavior is as reported "out of control" she needs ED evaluation to see what may be contributing to her behavior , also stabilization before she is safe to be in a nursing home

## 2012-02-28 NOTE — ED Notes (Signed)
Here for mental evaluation

## 2012-02-28 NOTE — ED Provider Notes (Addendum)
History  This chart was scribed for Mervin Kung, MD by Jenne Campus. This patient was seen in room APA16A/APA16A and the patient's care was started at 11:17PM  CSN: [id number]  Arrival date & time 02/28/12  2218   First MD Initiated Contact with Patient 02/28/12 2317      Chief Complaint  Patient presents with  . V70.1    The history is provided by the patient. No language interpreter was used.    Laura Atkins is a 71 y.o. female brought in by [employer] who presents to the Emergency for mental evaluation. Pt states that she had a family argument tonight. Family signed holding papers on her for being aggressive and irrational. She denies SI and HI currently. She is cooperative and non-aggressive in the ED. She states that she has an appointment tomorrow with behavioral health above her PCP's office. She has a h/o HTN, DM, HLD, GERD, OA and depression. She denies smoking and alcohol use.   PCP is Dr. Tula Nakayama.  Past Medical History  Diagnosis Date  . Hypertension   . Diabetes mellitus   . Hyperlipidemia   . Obesity   . Anemia   . Anxiety   . Depression   . GERD (gastroesophageal reflux disease)   . Pruritus   . Osteoarthritis   . Right shoulder pain   . Left knee pain   . Hypersomnia   . Urinary incontinence     Past Surgical History  Procedure Date  . Abdominal hysterectomy   . Cyst removed left breast   . Cholecystectomy     No family history on file.  History  Substance Use Topics  . Smoking status: Never Smoker   . Smokeless tobacco: Never Used  . Alcohol Use: No     Review of Systems  Constitutional: Negative for fever and chills.  Respiratory: Negative for shortness of breath.   Gastrointestinal: Negative for nausea and vomiting.  Neurological: Negative for weakness.  Psychiatric/Behavioral: Negative for suicidal ideas.    Allergies  Penicillins; Prednisone; and Propoxyphene-acetaminophen  Home Medications   Current  Outpatient Rx  Name Route Sig Dispense Refill  . AMLODIPINE BESYLATE 10 MG PO TABS Oral Take 1 tablet (10 mg total) by mouth daily. 30 tablet 5  . ASPIRIN 81 MG PO TABS Oral Take 81 mg by mouth daily.      . ATORVASTATIN CALCIUM 40 MG PO TABS Oral Take 1 tablet (40 mg total) by mouth daily. 30 tablet 0    Pt to discontinue lovastatin and start atorvastati ...  . BD INSULIN SYRINGE ULTRAFINE 30G X 1/2" 1 ML MISC  USE AS DIRECTED 100 each 2  . BENAZEPRIL HCL 20 MG PO TABS  TAKE ONE TABLET BY MOUTH EVERY DAY 90 tablet 2  . FUROSEMIDE 40 MG PO TABS Oral Take 1 tablet (40 mg total) by mouth 2 (two) times daily. 45 tablet 5  . GABAPENTIN 300 MG PO CAPS Oral Take 1 capsule (300 mg total) by mouth 3 (three) times daily. 90 capsule 5  . GLUCOSE BLOOD VI STRP  Three times daily testing  DX 250.00 100 each 11  . INSULIN LISPRO PROT & LISPRO (75-25) 100 UNIT/ML [place] SUSP Subcutaneous Inject into the skin. Per sliding scale     . KETOROLAC TROMETHAMINE 0.4 % OP SOLN Oral Take 1 drop by mouth 4 times daily.    Glory Rosebush ULTRASOFT LANCETS MISC  Use as instructed THREE TIMES DAILY  Dx 250.00 100  each 12  . LEVETIRACETAM 500 MG PO TABS Oral Take 1 tablet (500 mg total) by mouth 2 (two) times daily. 60 tablet 3  . MULTIVITAMINS PO TABS Oral Take 1 tablet by mouth daily.      Marland Kitchen NAPROXEN-ESOMEPRAZOLE 375-20 MG PO TBEC Oral Take 1 tablet by mouth 2 (two) times daily. 60 tablet 1  . SERTRALINE HCL 50 MG PO TABS Oral Take 1 tablet (50 mg total) by mouth daily. Three tabs daily 90 tablet 2  . VICTOZA 18 MG/3ML [place] SOLN Subcutaneous Inject into the skin Daily.    Marland Kitchen VITAMIN B-12 500 MCG PO TABS Oral Take 500 mcg by mouth daily.        Triage Vitals: BP 124/48  Pulse 94  Temp 98.4 F (36.9 C)  Resp 20  Ht 5\' 10"  (1.778 m)  Wt 264 lb (119.75 kg)  BMI 37.88 kg/m2  Physical Exam  Nursing note and vitals reviewed. Constitutional: She is oriented to person, place, and time. She appears well-developed and  well-nourished. No distress.  HENT:  Head: Normocephalic and atraumatic.  Eyes: Conjunctivae and EOM are normal.  Neck: Normal range of motion. Neck supple. No tracheal deviation present.  Cardiovascular: Normal rate, regular rhythm and normal heart sounds.   No murmur heard. Pulmonary/Chest: Effort normal and breath sounds normal. No respiratory distress. She has no wheezes.  Abdominal: Soft. There is no tenderness.  Musculoskeletal: Normal range of motion. She exhibits no edema.  Lymphadenopathy:    She has no cervical adenopathy.  Neurological: She is alert and oriented to person, place, and time.  Skin: Skin is warm and dry.  Psychiatric: She has a normal mood and affect. Her behavior is normal.    ED Course  Procedures (including critical care time)  DIAGNOSTIC STUDIES: None performed.  COORDINATION OF CARE: 11:58PM-Discussed treatment plan with pt and pt agreed to plan.   Labs Reviewed  URINALYSIS, ROUTINE W REFLEX MICROSCOPIC - Abnormal; Notable for the following:    Leukocytes, UA TRACE (*)    All other components within normal limits  CBC - Abnormal; Notable for the following:    Hemoglobin 10.7 (*)    HCT 34.8 (*)    MCV 69.2 (*)    MCH 21.3 (*)    All other components within normal limits  BASIC METABOLIC PANEL - Abnormal; Notable for the following:    Glucose, Bld 209 (*)    BUN 27 (*)    GFR calc non Af Amer 63 (*)    GFR calc Af Amer 73 (*)    All other components within normal limits  SALICYLATE LEVEL - Abnormal; Notable for the following:    Salicylate Lvl 123456 (*)    All other components within normal limits  URINE MICROSCOPIC-ADD ON - Abnormal; Notable for the following:    Squamous Epithelial / LPF FEW (*)    All other components within normal limits  DIFFERENTIAL  ACETAMINOPHEN LEVEL  ETHANOL  URINE RAPID DRUG SCREEN (HOSP PERFORMED)   No results found. Results for orders placed during the hospital encounter of 02/28/12  URINALYSIS, ROUTINE  W REFLEX MICROSCOPIC      Component Value Range   Color, Urine YELLOW  YELLOW    APPearance CLEAR  CLEAR    Specific Gravity, Urine 1.020  1.005 - 1.030    pH 5.5  5.0 - 8.0    Glucose, UA NEGATIVE  NEGATIVE (mg/dL)   Hgb urine dipstick NEGATIVE  NEGATIVE    Bilirubin  Urine NEGATIVE  NEGATIVE    Ketones, ur NEGATIVE  NEGATIVE (mg/dL)   Protein, ur NEGATIVE  NEGATIVE (mg/dL)   Urobilinogen, UA 0.2  0.0 - 1.0 (mg/dL)   Nitrite NEGATIVE  NEGATIVE    Leukocytes, UA TRACE (*) NEGATIVE   CBC      Component Value Range   WBC 7.1  4.0 - 10.5 (K/uL)   RBC 5.03  3.87 - 5.11 (MIL/uL)   Hemoglobin 10.7 (*) 12.0 - 15.0 (g/dL)   HCT 34.8 (*) 36.0 - 46.0 (%)   MCV 69.2 (*) 78.0 - 100.0 (fL)   MCH 21.3 (*) 26.0 - 34.0 (pg)   MCHC 30.7  30.0 - 36.0 (g/dL)   RDW 15.4  11.5 - 15.5 (%)   Platelets 225  150 - 400 (K/uL)  DIFFERENTIAL      Component Value Range   Neutrophils Relative 46  43 - 77 (%)   Neutro Abs 3.3  1.7 - 7.7 (K/uL)   Lymphocytes Relative 44  12 - 46 (%)   Lymphs Abs 3.1  0.7 - 4.0 (K/uL)   Monocytes Relative 6  3 - 12 (%)   Monocytes Absolute 0.4  0.1 - 1.0 (K/uL)   Eosinophils Relative 4  0 - 5 (%)   Eosinophils Absolute 0.3  0.0 - 0.7 (K/uL)   Basophils Relative 0  0 - 1 (%)   Basophils Absolute 0.0  0.0 - 0.1 (K/uL)  BASIC METABOLIC PANEL      Component Value Range   Sodium 139  135 - 145 (mEq/L)   Potassium 3.9  3.5 - 5.1 (mEq/L)   Chloride 98  96 - 112 (mEq/L)   CO2 28  19 - 32 (mEq/L)   Glucose, Bld 209 (*) 70 - 99 (mg/dL)   BUN 27 (*) 6 - 23 (mg/dL)   Creatinine, Ser 0.91  0.50 - 1.10 (mg/dL)   Calcium 10.3  8.4 - 10.5 (mg/dL)   GFR calc non Af Amer 63 (*) >90 (mL/min)   GFR calc Af Amer 73 (*) >90 (mL/min)  ACETAMINOPHEN LEVEL      Component Value Range   Acetaminophen (Tylenol), Serum <15.0  10 - 30 (ug/mL)  SALICYLATE LEVEL      Component Value Range   Salicylate Lvl 123456 (*) 2.8 - 20.0 (mg/dL)  ETHANOL      Component Value Range   Alcohol, Ethyl (B)  <11  0 - 11 (mg/dL)  URINE RAPID DRUG SCREEN (HOSP PERFORMED)      Component Value Range   Opiates NONE DETECTED  NONE DETECTED    Cocaine NONE DETECTED  NONE DETECTED    Benzodiazepines NONE DETECTED  NONE DETECTED    Amphetamines NONE DETECTED  NONE DETECTED    Tetrahydrocannabinol NONE DETECTED  NONE DETECTED    Barbiturates NONE DETECTED  NONE DETECTED   URINE MICROSCOPIC-ADD ON      Component Value Range   Squamous Epithelial / LPF FEW (*) RARE    WBC, UA 3-6  <3 (WBC/hpf)   RBC / HPF 0-2  <3 (RBC/hpf)   Bacteria, UA RARE  RARE     1. Situational stress   2. Urinary tract infection       MDM   Patient denies any suicidal or homicidal thoughts. She was upset with her daughter and there was some threatening statements. Patient in the emergency department seems cooperative does not appear psychotic screening labs without significant findings questionable urinary tract infection will not treat but we'll  send culture.  Will get telemedicine psychiatric consult.   Addendum: Patient evaluated by telemedicine psychiatry he has removed the IVC and patient is appropriate for discharge home followup with community mental health.  I personally performed the services described in this documentation, which was scribed in my presence. The recorded information has been reviewed and considered.        Mervin Kung, MD 02/29/12 [place], MD 02/29/12 2312

## 2012-02-29 ENCOUNTER — Telehealth: Payer: Self-pay | Admitting: Family Medicine

## 2012-02-29 LAB — URINALYSIS, ROUTINE W REFLEX MICROSCOPIC
Bilirubin Urine: NEGATIVE
Glucose, UA: NEGATIVE mg/dL
Hgb urine dipstick: NEGATIVE
Ketones, ur: NEGATIVE mg/dL
Nitrite: NEGATIVE
Protein, ur: NEGATIVE mg/dL
Specific Gravity, Urine: 1.02 (ref 1.005–1.030)
Urobilinogen, UA: 0.2 mg/dL (ref 0.0–1.0)
pH: 5.5 (ref 5.0–8.0)

## 2012-02-29 LAB — RAPID URINE DRUG SCREEN, HOSP PERFORMED
Amphetamines: NOT DETECTED
Barbiturates: NOT DETECTED
Benzodiazepines: NOT DETECTED
Cocaine: NOT DETECTED
Opiates: NOT DETECTED
Tetrahydrocannabinol: NOT DETECTED

## 2012-02-29 LAB — URINE MICROSCOPIC-ADD ON

## 2012-02-29 NOTE — ED Notes (Signed)
Telepysch being conducted at this time.

## 2012-02-29 NOTE — Telephone Encounter (Signed)
I left a message for family/pt to call back they had called asking for help with placement and pt had been seen in ED yesterday, if they call back please schedule an appointment with me early next week , pt to come with daughter/s who have been calling in for help and responsible for her care primarily,they were present at last vist, if possible

## 2012-02-29 NOTE — ED Notes (Signed)
Pt carried to restroom again to attempt urine sample.

## 2012-02-29 NOTE — Discharge Instructions (Signed)
followup with psychiatry later today as scheduled followup with your primary care Dr. As scheduled. Return for any new or worse symptoms return for any suicidal thoughts or any thoughts of the wanting to harm your family. The psychiatrist has release she did go home.

## 2012-02-29 NOTE — ED Notes (Signed)
Pt provided a drink at this time. No other needs voiced.

## 2012-02-29 NOTE — Telephone Encounter (Signed)
Message left to speak with patient or family member, no one answered the telephone when I called

## 2012-03-02 LAB — URINE CULTURE: Culture  Setup Time: 201306071435

## 2012-03-03 ENCOUNTER — Ambulatory Visit (INDEPENDENT_AMBULATORY_CARE_PROVIDER_SITE_OTHER): Payer: Medicare Other | Admitting: Psychiatry

## 2012-03-03 DIAGNOSIS — F329 Major depressive disorder, single episode, unspecified: Secondary | ICD-10-CM

## 2012-03-03 DIAGNOSIS — F3289 Other specified depressive episodes: Secondary | ICD-10-CM
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
## 2012-03-03 NOTE — Patient Instructions (Signed)
Discussed orally 

## 2012-03-04 NOTE — Telephone Encounter (Signed)
Pt hasn't called back and neither of the other family members have.

## 2012-03-05 ENCOUNTER — Ambulatory Visit (INDEPENDENT_AMBULATORY_CARE_PROVIDER_SITE_OTHER): Payer: Medicare Other | Admitting: Family Medicine

## 2012-03-05 VITALS — BP 140/72 | HR 100 | Resp 18 | Wt 265.0 lb

## 2012-03-05 DIAGNOSIS — Z6379 Other stressful life events affecting family and household: Secondary | ICD-10-CM

## 2012-03-05 DIAGNOSIS — Z638 Other specified problems related to primary support group: Secondary | ICD-10-CM
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
## 2012-03-06 ENCOUNTER — Other Ambulatory Visit (HOSPITAL_COMMUNITY): Payer: Self-pay | Admitting: Psychiatry

## 2012-03-10 ENCOUNTER — Encounter: Payer: Self-pay | Admitting: Family Medicine

## 2012-03-10 ENCOUNTER — Ambulatory Visit: Payer: Medicare Other | Admitting: Family Medicine

## 2012-03-10 ENCOUNTER — Telehealth (HOSPITAL_COMMUNITY): Payer: Self-pay | Admitting: Dietician

## 2012-03-10 ENCOUNTER — Ambulatory Visit (INDEPENDENT_AMBULATORY_CARE_PROVIDER_SITE_OTHER): Payer: Medicare Other | Admitting: Family Medicine

## 2012-03-10 VITALS — BP 122/70 | HR 88 | Resp 16 | Ht 67.5 in | Wt 265.0 lb

## 2012-03-10 DIAGNOSIS — Z638 Other specified problems related to primary support group: Secondary | ICD-10-CM

## 2012-03-10 DIAGNOSIS — Z6379 Other stressful life events affecting family and household: Secondary | ICD-10-CM

## 2012-03-10 DIAGNOSIS — E109 Type 1 diabetes mellitus without complications: Secondary | ICD-10-CM

## 2012-03-10 DIAGNOSIS — Z630 Problems in relationship with spouse or partner: Secondary | ICD-10-CM

## 2012-03-10 DIAGNOSIS — I10 Essential (primary) hypertension: Secondary | ICD-10-CM

## 2012-03-10 DIAGNOSIS — F329 Major depressive disorder, single episode, unspecified: Secondary | ICD-10-CM

## 2012-03-10 NOTE — Telephone Encounter (Signed)
Appointment scheduled for 03/17/12 at 10:00 AM.

## 2012-03-10 NOTE — Telephone Encounter (Signed)
Received referral from [facility] (Dr. Moshe Cipro) for dx: diabetes, obesity.

## 2012-03-10 NOTE — Patient Instructions (Addendum)
F/u in 5 weeks.  You are being referred for diabetic education , individual teaching with your husband and daughter. The educator will call you.  You are being referred for marriage counseling.   Please regularly contribute an amount you both decide on for contribution to running the house as discussed, give this to your husband and trust him to use it for household purposes.    No change blood pressure management  Blood sugars seem to be much better, this is important, please keep f/u with Dr Dorris Fetch

## 2012-03-12 ENCOUNTER — Telehealth (HOSPITAL_COMMUNITY): Payer: Self-pay | Admitting: Dietician

## 2012-03-12 NOTE — Telephone Encounter (Signed)
Mailed appointment confirmation letter and instructions for appointment scheduled 03/17/12 at 10:00 AM via [place] [employer].

## 2012-03-13 ENCOUNTER — Ambulatory Visit (INDEPENDENT_AMBULATORY_CARE_PROVIDER_SITE_OTHER): Payer: Medicare Other | Admitting: Psychiatry

## 2012-03-13 ENCOUNTER — Encounter (HOSPITAL_COMMUNITY): Payer: Self-pay | Admitting: Psychiatry

## 2012-03-13 ENCOUNTER — Ambulatory Visit (HOSPITAL_COMMUNITY): Payer: Medicare Other | Admitting: Psychiatry

## 2012-03-13 ENCOUNTER — Other Ambulatory Visit: Payer: Self-pay | Admitting: Family Medicine

## 2012-03-13 VITALS — Wt 265.0 lb

## 2012-03-13 DIAGNOSIS — F329 Major depressive disorder, single episode, unspecified: Secondary | ICD-10-CM

## 2012-03-13 MED ORDER — SERTRALINE HCL 50 MG PO TABS: ORAL_TABLET | ORAL | Status: DC
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
## 2012-03-14 ENCOUNTER — Other Ambulatory Visit: Payer: Self-pay

## 2012-03-14 DIAGNOSIS — M1990 Unspecified osteoarthritis, unspecified site: Secondary | ICD-10-CM

## 2012-03-14 DIAGNOSIS — E785 Hyperlipidemia, unspecified: Secondary | ICD-10-CM

## 2012-03-14 MED ORDER — ATORVASTATIN CALCIUM 40 MG PO TABS: 40.0000 mg | ORAL_TABLET | Freq: Every day | ORAL | Status: DC

## 2012-03-14 MED ORDER — NAPROXEN-ESOMEPRAZOLE 375-20 MG PO TBEC: 1.0000 | DELAYED_RELEASE_TABLET | Freq: Two times a day (BID) | ORAL | Status: DC

## 2012-03-15 ENCOUNTER — Encounter: Payer: Self-pay | Admitting: Family Medicine

## 2012-03-15 DIAGNOSIS — Z6379 Other stressful life events affecting family and household: Secondary | ICD-10-CM | POA: Insufficient documentation

## 2012-03-15 NOTE — Assessment & Plan Note (Signed)
Conference held with family members who were both present physically and by telephone. No consensus of opinion on any changes to be made were verbalized at the meeting. I was especially grateful for the involvement of the office manager at my request, who spent at least an additional 10 to 15 minutes with the family. Continued care at this facility and further f/u here will be at the decision of patient and family

## 2012-03-15 NOTE — Patient Instructions (Signed)
F/u as before

## 2012-03-17 ENCOUNTER — Encounter (HOSPITAL_COMMUNITY): Payer: Self-pay | Admitting: Dietician

## 2012-03-17 NOTE — Assessment & Plan Note (Signed)
Stable, followed by psych

## 2012-03-17 NOTE — Assessment & Plan Note (Signed)
Historically seems to be improving, however pt advised to keep close f/u and follow directions from responsible Md, Dr Dorris Fetch

## 2012-03-17 NOTE — Assessment & Plan Note (Signed)
Longstanding marital issues, now worse due to recent illness, couple interested in counseling, same will be requested

## 2012-03-17 NOTE — Progress Notes (Signed)
Outpatient Initial Nutrition Assessment  Date:03/17/2012   Time: 10:00 AM  Referring Physician: Dr. Moshe Cipro Reason for Visit: obesity, diabetes  Nutrition Assessment:  Height: 5' 7.5" (171.5 cm)   Weight: 262 lb (118.842 kg)   IBW: 138# %IBW: 190% UBW:  272# %UBW: 96%  Body mass index is 40.43 kg/(m^2).  Goal Weight: 200# (per pt) Weight hx: Pt reports UBW of 276#. She reports she has been trying to lose weight. Noted steady decline of weight since 11/13. She reports her highest weight was 285#. Wt Readings from Last 10 Encounters:  03/17/12 262 lb (118.842 kg)  03/13/12 265 lb (120.203 kg)  03/10/12 265 lb (120.203 kg)  03/05/12 265 lb 0.6 oz (120.221 kg)  02/28/12 264 lb (119.75 kg)  02/04/12 268 lb 1.9 oz (121.618 kg)  02/01/12 267 lb (121.11 kg)  01/29/12 262 lb 12.6 oz (119.2 kg)  12/13/11 274 lb 1.3 oz (124.322 kg)  12/03/11 280 lb 1.9 oz (127.062 kg)  ] Estimated nutritional needs: 1972-2151 kcals daily, 95-119 grams protein daily, 1.8-2.2 L fluid daily  PMH:  Past Medical History  Diagnosis Date  . Hypertension   . Diabetes mellitus   . Hyperlipidemia   . Obesity   . Anemia   . Anxiety   . Depression   . GERD (gastroesophageal reflux disease)   . Pruritus   . Osteoarthritis   . Right shoulder pain   . Left knee pain   . Hypersomnia   . Urinary incontinence     Medications:  Current Outpatient Rx  Name Route Sig Dispense Refill  . AMLODIPINE BESYLATE 10 MG PO TABS Oral Take 1 tablet (10 mg total) by mouth daily. 30 tablet 5  . ASPIRIN 81 MG PO TABS Oral Take 81 mg by mouth daily.      . ATORVASTATIN CALCIUM 40 MG PO TABS Oral Take 1 tablet (40 mg total) by mouth daily. 30 tablet 3    Pt to discontinue lovastatin and start atorvastati ...  . BD INSULIN SYRINGE ULTRAFINE 30G X 1/2" 1 ML MISC  USE AS DIRECTED 100 each 2  . BENAZEPRIL HCL 20 MG PO TABS  TAKE ONE TABLET BY MOUTH EVERY DAY 90 tablet 2  . FUROSEMIDE 40 MG PO TABS Oral Take 1 tablet (40 mg total)  by mouth 2 (two) times daily. 45 tablet 5  . GABAPENTIN 300 MG PO CAPS Oral Take 1 capsule (300 mg total) by mouth 3 (three) times daily. 90 capsule 5  . GLUCOSE BLOOD VI STRP  Three times daily testing  DX 250.00 100 each 11  . INSULIN LISPRO PROT & LISPRO (75-25) 100 UNIT/ML [place] SUSP Subcutaneous Inject into the skin. Per sliding scale     . KETOROLAC TROMETHAMINE 0.4 % OP SOLN Oral Take 1 drop by mouth 4 times daily.    Glory Rosebush ULTRASOFT LANCETS MISC  Use as instructed THREE TIMES DAILY  Dx 250.00 100 each 12  . LEVETIRACETAM 500 MG PO TABS Oral Take 1 tablet (500 mg total) by mouth 2 (two) times daily. 60 tablet 3  . MULTIVITAMINS PO TABS Oral Take 1 tablet by mouth daily.      Marland Kitchen NAPROXEN-ESOMEPRAZOLE 375-20 MG PO TBEC Oral Take 1 tablet by mouth 2 (two) times daily. 60 tablet 3  . SERTRALINE HCL 50 MG PO TABS  Take 3 tab daily 90 tablet 0    Must keep appointment with Doctor  . VICTOZA 18 MG/3ML [place] SOLN Subcutaneous Inject into the skin  Daily.    Marland Kitchen VITAMIN B-12 500 MCG PO TABS Oral Take 500 mcg by mouth daily.        Labs: CMP     Component Value Date/Time   NA 139 02/28/2012 2259   K 3.9 02/28/2012 2259   CL 98 02/28/2012 2259   CO2 28 02/28/2012 2259   GLUCOSE 209* 02/28/2012 2259   BUN 27* 02/28/2012 2259   CREATININE 0.91 02/28/2012 2259   CREATININE 0.82 09/21/2011 1045   CALCIUM 10.3 02/28/2012 2259   PROT 6.4 01/26/2012 0450   ALBUMIN 3.2* 01/26/2012 0450   AST 30 01/26/2012 0450   ALT 16 01/26/2012 0450   ALKPHOS 92 01/26/2012 0450   BILITOT 0.5 01/26/2012 0450   GFRNONAA 63* 02/28/2012 2259   GFRAA 73* 02/28/2012 2259    Lipid Panel     Component Value Date/Time   CHOL 172 09/21/2011 1045   TRIG 88 09/21/2011 1045   HDL 66 09/21/2011 1045   CHOLHDL 2.6 09/21/2011 1045   VLDL 18 09/21/2011 1045   LDLCALC 88 09/21/2011 1045     Lab Results  Component Value Date   HGBA1C 13.3* 01/25/2012   HGBA1C 14.0* 12/07/2010   HGBA1C 13.2* 09/08/2010   Lab Results  Component Value Date    MICROALBUR 1.78 03/15/2010   LDLCALC 88 09/21/2011   CREATININE 0.91 02/28/2012    Lifestyle/ social habits: Laura Atkins lives in [place], [place] with her husband. She reports she requires 24 hour care; her children take turns during the week and weekends to care for her. 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 She reports her stress level as an 8, citing she is always stressed due to her decline in health and functional status. She presents with her husband, daughter, and granddaughter today.   Nutrition hx/habits: Ms. Greenan reports that she has has diabetes for 12-13 years with hx of poor control (noted Hgb A1c range of 7.8-14 with upward trend from 5/08 to present). Noted pt's last with RD was attending the diabetes class at [facility] on 02/09/2004. She reports that she has been in denial about her diagnosis from a long time; "I decided that if I didn't have it, then I didn't have to deal with it". Her family checks her glucose and administers her medications, as pt suffers from short term memory loss. Pt daughter reported that CBG was 106 at 8:14 AM today and CBG was 225 when checked in the office today at 10:20 AM. Pt daughter reports fasting readings in the low 100's and postprandial readings of 250 and greater. Both pt and daughter admit to pt's dietary indiscretion. Pt reports frustration that she cannot eat the foods that she wants to eat. She reports Dr. Dorris Fetch put her on a "strict diet; no sugar, no sweets, no bread". She reports that she used to drink 2 L of regular soda daily. Pt daughter prepares meals and has her on a strict meal schedule. She drinks mostly water. She reports they go out to eat or get take out 1-2 times per month. She reports participating in very little physical activity due to pain; she currently exercises 30 minutes once a week, doing PT exercises (foot lifts and walking) and shake weight.  Diet recall: Breakfast (8 AM): banana, grits, water; Snack: banana or pear; Lunch (12:30 PM):  tossed salad with [place], M[employer] applesauce cup, boiled cabbage, boiled squash, baked chicken (sometimes with gravy); Snack: salad OR banana OR pear; Dinner (5-6 PM): boiled chicken, squash,  turnip greens, diluted gingerale; Snack: small portion of dinner meal  Nutrition Diagnosis: Excessive carbohydrate intake r/t nutrition related knowledge deficit AEB Hgb A1c: 13.3.   Nutrition Intervention: Nutrition rx: 1500 kcal NAS, diabetic diet; 3 meals per day; 3 snacks per day; consistent meal pattern; physical activity as tolerated; low calorie beverages only  Education/Counseling Provided: Educated pt on diabetic diet principles. Emphasized plate method, sources of carbohydrate, and portion control. Discussed importance of regular physical activity to help meet glycemic and weight loss goals. Discussed nutrition content of commonly eaten foods, particularly sugary beverages and offered alternatives. Provided plate method and managing diabetes handouts. Provided Splenda samples upon request.  Understanding, Motivation, Ability to Follow Recommendations: Expect fair to poor compliance.   Monitoring and Evaluation: Goals: 1) 1-2# weight loss per week; 2) Physical activity as tolerated; 3) Hgb A1c < 7.0  Recommendations: 1) For weight loss: 1472-1651 kcals daily; 2) Break exercise up into smaller, more frequent sessions; 3) Consume preportioned snacks (ex. Applesauce cups or yogurt) to ensure proper portion control; 4) Consider water exercises (pt has a [employer])  F/U: PRN. Provided RD contact information.   Alene Mires, RD  03/17/2012  Time: 10:00 AM

## 2012-03-17 NOTE — Assessment & Plan Note (Signed)
Controlled, no change in medication  
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
## 2012-03-17 NOTE — Assessment & Plan Note (Signed)
Significant family stress following recent hospitalization and cardiac arrest, family trying to sort things out

## 2012-03-26 ENCOUNTER — Ambulatory Visit (INDEPENDENT_AMBULATORY_CARE_PROVIDER_SITE_OTHER): Payer: Medicare Other | Admitting: Psychiatry

## 2012-03-26 DIAGNOSIS — F329 Major depressive disorder, single episode, unspecified: Secondary | ICD-10-CM
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
## 2012-03-26 NOTE — Patient Instructions (Addendum)
Discussed orally 

## 2012-04-07 ENCOUNTER — Ambulatory Visit (INDEPENDENT_AMBULATORY_CARE_PROVIDER_SITE_OTHER): Payer: Medicare Other | Admitting: Neurology

## 2012-04-07 ENCOUNTER — Encounter: Payer: Self-pay | Admitting: Neurology

## 2012-04-07 VITALS — BP 140/70 | HR 68 | Wt 265.0 lb

## 2012-04-07 DIAGNOSIS — R569 Unspecified convulsions: Secondary | ICD-10-CM

## 2012-04-07 DIAGNOSIS — G629 Polyneuropathy, unspecified: Secondary | ICD-10-CM

## 2012-04-07 DIAGNOSIS — G589 Mononeuropathy, unspecified: Secondary | ICD-10-CM

## 2012-04-07 DIAGNOSIS — R413 Other amnesia: Secondary | ICD-10-CM

## 2012-04-07 DIAGNOSIS — M216X9 Other acquired deformities of unspecified foot: Secondary | ICD-10-CM

## 2012-04-07 DIAGNOSIS — M21371 Foot drop, right foot: Secondary | ICD-10-CM
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
## 2012-04-07 NOTE — Patient Instructions (Addendum)
Your appointment for the nerve conduction studies/electromyelogram is scheduled for Monday, August 12 at 8:30am at [place]. [place], [place].  We will call you with your appointment for the EEG.  Dr. Valentina Shaggy will call you with your appointment for the memory testing.  Physical Therapy will call you to schedule your PT appointments.

## 2012-04-09 ENCOUNTER — Ambulatory Visit (INDEPENDENT_AMBULATORY_CARE_PROVIDER_SITE_OTHER): Payer: Medicare Other | Admitting: Psychiatry

## 2012-04-09 DIAGNOSIS — F329 Major depressive disorder, single episode, unspecified: Secondary | ICD-10-CM
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
## 2012-04-14 NOTE — Patient Instructions (Signed)
Discussed orally 

## 2012-04-15 ENCOUNTER — Ambulatory Visit (INDEPENDENT_AMBULATORY_CARE_PROVIDER_SITE_OTHER): Payer: Medicare Other | Admitting: Psychiatry

## 2012-04-15 ENCOUNTER — Encounter: Payer: Self-pay | Admitting: Family Medicine

## 2012-04-15 ENCOUNTER — Other Ambulatory Visit (HOSPITAL_COMMUNITY): Payer: Self-pay

## 2012-04-15 ENCOUNTER — Ambulatory Visit (INDEPENDENT_AMBULATORY_CARE_PROVIDER_SITE_OTHER): Payer: Medicare Other | Admitting: Family Medicine

## 2012-04-15 ENCOUNTER — Encounter (HOSPITAL_COMMUNITY): Payer: Self-pay | Admitting: Psychiatry

## 2012-04-15 VITALS — Wt 265.0 lb

## 2012-04-15 VITALS — BP 144/70 | HR 76 | Resp 18 | Ht 67.75 in | Wt 266.1 lb

## 2012-04-15 DIAGNOSIS — I10 Essential (primary) hypertension: Secondary | ICD-10-CM

## 2012-04-15 DIAGNOSIS — E669 Obesity, unspecified: Secondary | ICD-10-CM

## 2012-04-15 DIAGNOSIS — F3289 Other specified depressive episodes: Secondary | ICD-10-CM

## 2012-04-15 DIAGNOSIS — F329 Major depressive disorder, single episode, unspecified: Secondary | ICD-10-CM

## 2012-04-15 DIAGNOSIS — R29898 Other symptoms and signs involving the musculoskeletal system: Secondary | ICD-10-CM

## 2012-04-15 DIAGNOSIS — E109 Type 1 diabetes mellitus without complications: Secondary | ICD-10-CM

## 2012-04-15 MED ORDER — SERTRALINE HCL 50 MG PO TABS: ORAL_TABLET | ORAL | Status: DC
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
## 2012-04-15 NOTE — Patient Instructions (Addendum)
F/u in 2.5 month  Please  Try and keep an appointment for marriage counseling.  Blood sugars seem to be improving, keep following the diet and testing like you should, Keep appt with Dr Dorris Fetch, call and see.  I am happy that things are improving at home.  Please do physical therapy, and continue to keep appointments with neurology

## 2012-04-15 NOTE — Progress Notes (Signed)
  Subjective:    Patient ID: Laura Atkins, female    DOB: December 19, 1940, 71 y.o.   MRN: [id number]  HPI Pt originally scheduled for annual exam however, refuses this today. New diagnosis of foot drop since last visit, and she is trying to deal with this Mental health and home situation is improving per report of herself and her spouse. She sees psychiatry. Blood sugars are brought to visit, and they are seldom over 250 , which is an improvement for her   Review of Systems See HPI Denies recent fever or chills. Denies sinus pressure, nasal congestion, ear pain or sore throat. Denies chest congestion, productive cough or wheezing. Denies chest pains, palpitations and leg swelling Denies abdominal pain, nausea, vomiting,diarrhea or constipation.   Chronic urinary  incontinence. Chronic  joint pain,  and limitation in mobility. Denies headaches, seizures, numbness, or tingling. Denies uncontrolled  depression, anxiety or insomnia. Denies skin break down or rash.        Objective:   Physical Exam Patient alert and oriented and in no cardiopulmonary distress.  HEENT: No facial asymmetry, EOMI, no sinus tenderness,  oropharynx pink and moist.  Neck decreased ROM, no adenopathy.  Chest: Clear to auscultation bilaterally.  CVS: S1, S2 no murmurs, no S3.  ABD: Soft non tender. Bowel sounds normal.  Ext: No edema  MS: decreased  ROM spine, shoulders, hips and knees.  Skin: Intact, no ulcerations or rash noted.  Psych: Good eye contact, normal affect. Memory loss, mildly  anxious not  depressed appearing.  CNS: CN 2-12 intact, power, nd sensation decreased  throughout.        Assessment & Plan:

## 2012-04-22 ENCOUNTER — Ambulatory Visit (HOSPITAL_COMMUNITY)
Admission: RE | Admit: 2012-04-22 | Discharge: 2012-04-22 | Disposition: A | Discharge status: Home / Self Care | Payer: Medicare Other | Source: Ambulatory Visit | Attending: Neurology | Admitting: Neurology

## 2012-04-22 DIAGNOSIS — I10 Essential (primary) hypertension: Secondary | ICD-10-CM | POA: Insufficient documentation

## 2012-04-22 DIAGNOSIS — R262 Difficulty in walking, not elsewhere classified: Secondary | ICD-10-CM | POA: Insufficient documentation

## 2012-04-22 DIAGNOSIS — R29898 Other symptoms and signs involving the musculoskeletal system: Secondary | ICD-10-CM | POA: Insufficient documentation

## 2012-04-22 DIAGNOSIS — IMO0001 Reserved for inherently not codable concepts without codable children: Secondary | ICD-10-CM | POA: Insufficient documentation

## 2012-04-22 DIAGNOSIS — R2689 Other abnormalities of gait and mobility: Secondary | ICD-10-CM | POA: Insufficient documentation

## 2012-04-22 DIAGNOSIS — R269 Unspecified abnormalities of gait and mobility: Secondary | ICD-10-CM | POA: Insufficient documentation

## 2012-04-22 DIAGNOSIS — M6281 Muscle weakness (generalized): Secondary | ICD-10-CM | POA: Insufficient documentation

## 2012-04-22 NOTE — Evaluation (Addendum)
Physical Therapy Evaluation  Patient Details  Name: Laura Atkins MRN: [id number] Date of Birth: [birth year]  Today's Date: 04/22/2012 Time: 1033-1106 Laura Atkins Time Calculation (min): 33 min  Visit#: 1  of 12   Re-eval: 05/22/12 Assessment Diagnosis: foot drop Prior Therapy: none  Authorization: [employer] medicare   Past Medical History:  Past Medical History  Diagnosis Date  . Hypertension   . Diabetes mellitus   . Hyperlipidemia   . Obesity   . Anemia   . Anxiety   . Depression   . GERD (gastroesophageal reflux disease)   . Pruritus   . Osteoarthritis   . Right shoulder pain   . Left knee pain   . Hypersomnia   . Urinary incontinence    Past Surgical History:  Past Surgical History  Procedure Date  . Abdominal hysterectomy   . Cyst removed left breast   . Cholecystectomy     Subjective Symptoms/Limitations Symptoms: Laura Atkins states that in May she was admitted into the hospital with seizures.  She coded and was rescitated.  She states that she has noticed that she is not walking as fast and she has less control of her right foot.   How long can you stand comfortably?: The patient states that she is able to stand for only a short period of time, (5-10 min), because her balance has decreased since her hospitalization.   How long can you walk comfortably?: The patient is now using a walker, prior to her hospitalization she was not using any assistive device.  She is only able to walk for 5-10 minutes. Patient Stated Goals: The patient would like to walk better and to be able to drive again. Pain Assessment Currently in Pain?: Yes Pain Score: 10-Worst pain ever Pain Location: Leg Pain Orientation: Right Pain Type: Chronic pain Pain Radiating Towards: knee Pain Onset: More than a month ago Pain Frequency: Intermittent Pain Relieving Factors: medication  Prior Functioning  Home Living Lives With: Family Prior Function Driving:  Yes  Cognition/Observation Cognition Overall Cognitive Status: Appears within functional limits for tasks assessed  Sensation/Coordination/Flexibility/Functional Tests Functional Tests Functional Tests: sit to stand using hands 4 in 30 seconds Functional Tests: LEFS 1/64  Assessment RLE Strength Right Hip Flexion: 3+/5 Right Hip Extension: 2+/5 Right Hip ABduction: 3-/5 Right Hip ADduction: 2+/5 Right Knee Flexion: 4/5 Right Knee Extension: 4/5 Right Ankle Dorsiflexion: 1/5 Right Ankle Plantar Flexion: 2-/5 LLE Strength Left Hip Flexion: 3+/5 Left Hip Extension: 2+/5 Left Hip ABduction: 2+/5 Left Hip ADduction: 4/5 Left Knee Flexion: 5/5 Left Knee Extension: 3+/5 Left Ankle Dorsiflexion: 3+/5 Left Ankle Plantar Flexion: 3-/5  Exercise/Treatments Mobility/Balance  Static Standing Balance Static Standing - Balance Support: No upper extremity supported Static Standing - Level of Assistance: 5: Stand by assistance Static Standing - Comment/# of Minutes:  (2) Berg Balance Test Sit to Stand: Able to stand  independently using hands Standing Unsupported: Able to stand 2 minutes with supervision Sitting with Back Unsupported but Feet Supported on Floor or Stool: Able to sit safely and securely 2 minutes Stand to Sit: Uses backs of legs against chair to control descent Transfers: Able to transfer safely, definite need of hands Standing Unsupported with Eyes Closed: Able to stand 10 seconds with supervision Standing Ubsupported with Feet Together: Needs help to attain position and unable to hold for 15 seconds From Standing, Reach Forward with Outstretched Arm: Can reach forward >5 cm safely (2") From Standing Position, Pick up Object from Floor: Able to pick  up shoe, needs supervision From Standing Position, Turn to Look Behind Over each Shoulder: Turn sideways only but maintains balance Turn 360 Degrees: Needs close supervision or verbal cueing Standing Unsupported,  Alternately Place Feet on Step/Stool: Needs assistance to keep from falling or unable to try Standing Unsupported, One Foot in Front: Needs help to step but can hold 15 seconds Standing on One Leg: Unable to try or needs assist to prevent fall Total Score: 27      Seated Long Arc Quad: Both;5 reps Supine Hip Adduction Isometric: 5 reps Bridges: 5 reps Other Supine Knee Exercises: ankle DF/PF Sidelying Hip ABduction: Both;5 reps    G-Code base on LEFS CM Goal CJ Physical Therapy Assessment and Plan Laura Atkins Assessment and Plan Clinical Impression Statement: Laura Atkins to department with significant decreased LE strength especially R AT, balance disturbance who will benefit from skilled Laura Atkins to increase functional abiltiy and decrease risk of falling. Laura Atkins will benefit from skilled therapeutic intervention in order to improve on the following deficits: Abnormal gait;Decreased activity tolerance;Decreased strength;Difficulty walking;Decreased mobility Rehab Potential: Good Laura Atkins Frequency: Min 3X/week Laura Atkins Duration: 4 weeks Laura Atkins Treatment/Interventions: Gait training;Balance training;Modalities;Therapeutic exercise;Patient/family education Laura Atkins Plan: See Laura Atkins for strengthening;  NEXT treatment add bike, heel raise, minisquat, SLS, Russian stim to R AT to enhance mm strengthening,    Goals Home Exercise Program Laura Atkins will Perform Home Exercise Program: Independently Laura Atkins Short Term Goals Time to Complete Short Term Goals: 2 weeks Laura Atkins Short Term Goal 1: increase LE strength by 1/2 grade. Laura Atkins Short Term Goal 2: Laura Atkins LEFS to increase by 10 levels Laura Atkins Short Term Goal 3: Laura Atkins berg to be increased by 5 levels Laura Atkins Long Term Goals Time to Complete Long Term Goals: 4 weeks Laura Atkins Long Term Goal 1: Laura Atkins mm strength to be increased by one grade Laura Atkins Long Term Goal 2: Laura Atkins Berg to be increased by 10 levels to allow Laura Atkins to be safe ambulating inside the house with a cane Long Term Goal 3: Laura Atkins LEFS to be increased by 15 Long Term Goal 4: Laura Atkins to be  able to come sit to stand without UE  Problem List Patient Active Problem List  Diagnosis  . IDDM  . HYPERLIPIDEMIA  . OBESITY  . ANEMIA  . DEPRESSION  . HYPERTENSION  . GERD  . OSTEOARTHRITIS  . SHOULDER PAIN, RIGHT  . KNEE PAIN, LEFT  . NECK PAIN, CHRONIC  . BACK PAIN, CHRONIC  . DISORDER OF BONE AND CARTILAGE UNSPECIFIED  . HYPERSOMNIA, ASSOCIATED WITH SLEEP APNEA  . TREMOR  . URINARY INCONTINENCE  . UNSTEADY GAIT  . Headache  . ACUTE BRONCHITIS  . Pelvic mass in female  . Abdominal mass  . Dermatomycosis  . Bronchitis  . Paresthesia and pain of right extremity  . Vomiting  . Dehydration  . Generalized weakness  . Orthostatic hypotension  . Altered mental status  . Acute respiratory failure with hypoxia  . Seizure  . Stress due to marital problems  . Stressful life event affecting family  . Difficulty in walking  . Weakness of both legs  . Balance disorder    Laura Atkins - End of Session Activity Tolerance: Patient tolerated treatment well General Behavior During Session: [facility] for tasks performed Cognition: [facility] for tasks performed Laura Atkins Plan of Care Laura Atkins Home Exercise Plan: hep    RUSSELL,CINDY 04/22/2012, 1:36 PM  Physician Documentation Your signature is required to indicate approval of the treatment plan as stated above.  Please sign and either send electronically or  make a copy of this report for your files and return this physician signed original.   Please mark one 1.__approve of plan  2. ___approve of plan with the following conditions.   ______________________________                                                          _____________________ Physician Signature                                                                                                             Date

## 2012-04-23 ENCOUNTER — Ambulatory Visit (HOSPITAL_COMMUNITY)
Admission: RE | Admit: 2012-04-23 | Discharge: 2012-04-23 | Disposition: A | Discharge status: Home / Self Care | Payer: Medicare Other | Source: Ambulatory Visit | Attending: Family Medicine | Admitting: Family Medicine

## 2012-04-23 NOTE — Progress Notes (Signed)
Physical Therapy Treatment Patient Details  Name: Laura Atkins MRN: [id number] Date of Birth: 10/08/1940  Today's Date: 04/23/2012 Time: 1019-1110 PT Time Calculation (min): 51 min  Visit#: 2  of 12   Re-eval: 05/22/12 Charges: NMR x 18' Therex x 20'  Authorization: [employer] medicare     Subjective: Symptoms/Limitations Symptoms: My L leg feels weak. Pain Assessment Currently in Pain?: No/denies   Exercise/Treatments Aerobic Stationary Bike: 8'@2 .0 Standing Heel Raises: 10 reps Functional Squat: 10 reps SLS: 10" B with 1 UE assist Seated Long Arc Quad: 10 reps Other Seated Knee Exercises: hip adductiona isometric 10x5" Supine Other Supine Knee Exercises: R PF with mm tapping for NMR  Physical Therapy Assessment and Plan PT Assessment and Plan Clinical Impression Statement: Pt responds well to mm tapping to facilitate AT contraction. Estim not completed secondary to history of seizures. Will discuss further with PT. Pt requires vc's throughout tx to improve posture. Pt responds well to cueing to place heel down first with gait. Pt is without complaint throughout session. PT Plan: Continue to progress per PT POC. Will discuss estim with PT.     Problem List Patient Active Problem List  Diagnosis  . IDDM  . HYPERLIPIDEMIA  . OBESITY  . ANEMIA  . DEPRESSION  . HYPERTENSION  . GERD  . OSTEOARTHRITIS  . SHOULDER PAIN, RIGHT  . KNEE PAIN, LEFT  . NECK PAIN, CHRONIC  . BACK PAIN, CHRONIC  . DISORDER OF BONE AND CARTILAGE UNSPECIFIED  . HYPERSOMNIA, ASSOCIATED WITH SLEEP APNEA  . TREMOR  . URINARY INCONTINENCE  . UNSTEADY GAIT  . Headache  . ACUTE BRONCHITIS  . Pelvic mass in female  . Abdominal mass  . Dermatomycosis  . Bronchitis  . Paresthesia and pain of right extremity  . Vomiting  . Dehydration  . Generalized weakness  . Orthostatic hypotension  . Altered mental status  . Acute respiratory failure with hypoxia  . Seizure  . Stress due  to marital problems  . Stressful life event affecting family  . Difficulty in walking  . Weakness of both legs  . Balance disorder    PT - End of Session Activity Tolerance: Patient tolerated treatment well General Behavior During Session: [facility] for tasks performed Cognition: [facility] for tasks performed   Rachelle Hora, PTA 04/23/2012, 11:40 AM

## 2012-04-28 DIAGNOSIS — R413 Other amnesia: Secondary | ICD-10-CM

## 2012-04-29 ENCOUNTER — Telehealth: Payer: Self-pay | Admitting: Family Medicine

## 2012-04-29 ENCOUNTER — Ambulatory Visit (HOSPITAL_COMMUNITY)
Admission: RE | Admit: 2012-04-29 | Discharge: 2012-04-29 | Disposition: A | Discharge status: Home / Self Care | Payer: Medicare Other | Source: Ambulatory Visit | Attending: Neurology | Admitting: Neurology

## 2012-04-29 DIAGNOSIS — F19239 Other psychoactive substance dependence with withdrawal, unspecified: Secondary | ICD-10-CM | POA: Insufficient documentation

## 2012-04-29 DIAGNOSIS — R9401 Abnormal electroencephalogram [EEG]: Secondary | ICD-10-CM | POA: Insufficient documentation

## 2012-04-29 DIAGNOSIS — R569 Unspecified convulsions: Secondary | ICD-10-CM

## 2012-04-29 DIAGNOSIS — F1320 Sedative, hypnotic or anxiolytic dependence, uncomplicated: Secondary | ICD-10-CM | POA: Insufficient documentation

## 2012-04-29 DIAGNOSIS — F19939 Other psychoactive substance use, unspecified with withdrawal, unspecified: Secondary | ICD-10-CM | POA: Insufficient documentation

## 2012-04-29 NOTE — Progress Notes (Signed)
EEG completed as outpatient EEG at [facility]

## 2012-04-30 NOTE — Telephone Encounter (Signed)
Pt has appt tomorrow for evaluation

## 2012-05-01 ENCOUNTER — Encounter: Payer: Self-pay | Admitting: Family Medicine

## 2012-05-01 ENCOUNTER — Ambulatory Visit (INDEPENDENT_AMBULATORY_CARE_PROVIDER_SITE_OTHER): Payer: Medicare Other | Admitting: Family Medicine

## 2012-05-01 VITALS — BP 142/70 | HR 82 | Resp 18 | Ht 67.75 in | Wt 266.0 lb

## 2012-05-01 DIAGNOSIS — R21 Rash and other nonspecific skin eruption: Secondary | ICD-10-CM

## 2012-05-01 MED ORDER — CLOTRIMAZOLE-BETAMETHASONE 1-0.05 % EX CREA: TOPICAL_CREAM | CUTANEOUS | Status: DC

## 2012-05-01 NOTE — Patient Instructions (Signed)
Use the lotrisone cream on your skin If you notice the bump again please call  Keep previous appt with Dr. Moshe Cipro

## 2012-05-03 NOTE — Assessment & Plan Note (Signed)
Improving pt in therapy, has missed marriage counseling, treatment is through psychiatry

## 2012-05-03 NOTE — Assessment & Plan Note (Signed)
Sub optimal control, no med change. DASH diet and commitment to daily physical activity for a minimum of 30 minutes discussed and encouraged, as a part of hypertension management. The importance of attaining a healthy weight is also discussed.

## 2012-05-03 NOTE — Assessment & Plan Note (Signed)
Unchanged. Patient re-educated about  the importance of commitment to a  minimum of 150 minutes of exercise per week. The importance of healthy food choices with portion control discussed. Encouraged to start a food diary, count calories and to consider  joining a support group. Sample diet sheets offered. Goals set by the patient for the next several months.    

## 2012-05-03 NOTE — Assessment & Plan Note (Signed)
Recent dx of food drop, referred to physical therapy for strengthening

## 2012-05-03 NOTE — Assessment & Plan Note (Signed)
Uncontrolled, followed by endo, improving blood sugars now that entirely family is involved in nutrition and med administration

## 2012-05-04 ENCOUNTER — Encounter: Payer: Self-pay | Admitting: Family Medicine

## 2012-05-04 DIAGNOSIS — R21 Rash and other nonspecific skin eruption: Secondary | ICD-10-CM | POA: Insufficient documentation

## 2012-05-04 NOTE — Assessment & Plan Note (Signed)
Rash appears to be fungal in nature based on appearance, I do not appreciate any lump or swelling in area of concern. They will monitor in case the swelling or lump re-appears then ultrasound will be done. Fungal cream given

## 2012-05-04 NOTE — Progress Notes (Signed)
  Subjective:    Patient ID: Laura Atkins, female    DOB: 1940-11-03, 71 y.o.   MRN: [id number]  HPI  Concern for swelling felt beneath left axilla, no pain, , daughter has noticed a rash on her left back. Denies fever, she thought there was a knot under her arm she felt a few days ago. No injury Normal Mammogram in 2012   Review of Systems - per above  GEN- denies fatigue, fever, weight loss,weakness, recent illness CVS- denies chest pain, palpitations RESP- denies SOB, cough, wheeze ABD- denies N/V, change in stools, abd pain       Objective:   Physical Exam GEN- NAD, alert and oriented x3 Breast- normal symmetry, no nipple inversion,no nipple drainage, no nodules or lumps felt Nodes- no axillary nodes Skin-  4cm circular rash, raised with mild erythema and flakey skin on abdomen, 2cm below left axilla and on left flank region, no swelling noted      Assessment & Plan:

## 2012-05-05 NOTE — Procedures (Signed)
  This routine EEG was requested in this 71 year old women with a history of Xanax withdrawal seizures who is having ongoing problems with confusion and agitation.  The EEG was requested to identify IEDs.  Medications include Keppra.  The EEG was done with the patient awake and drowsy.  During periods of maximal wakefulness a posterior dominant rhythm of 7- 8 cps could be seen over the left hemisphere.  Over the right hemisphere, the posterior rhythm was in the 6-7 cps range.  It was also interrupted at times by slower theta and delta range slowing.  This slowing was seen more infrequently over the left temporal area.  Low to medium amplitude frontally dominant beta activities were seen most prominently over the midline and left parasagittal region.    Photic stimulation did provoke a driving response.  Hyperventilation was not performed.  The patient did become drowsy as evidenced by an attenuation of muscle activity and some mild slowing of background activities.  Clinical Interpretation:  This routine EEG done with the patient awake and drowsy is abnormal.  Background activities were slightly too slow suggesting a mild encephalopathy of non-specific etiology.  However, the right hemisphere did seem to be slower than the left suggesting a structural or functional abnormality in that location.  Kavin Leech Jacelyn Grip, MD [facility] Neurology, [place]

## 2012-05-07 ENCOUNTER — Ambulatory Visit (INDEPENDENT_AMBULATORY_CARE_PROVIDER_SITE_OTHER): Payer: Medicare Other | Admitting: Psychiatry

## 2012-05-07 DIAGNOSIS — F329 Major depressive disorder, single episode, unspecified: Secondary | ICD-10-CM

## 2012-05-07 NOTE — Progress Notes (Signed)
Patient:  Laura Atkins   DOB: May 27, 1941  MR Number: [id number]  Location: [place]:  [address] [place],  [place], [address]  Start: Wednesday 05/07/2012 11:20 AM End: Wednesday 05/07/2012 12:10 PM  Provider/Observer:     Maurice Small, MSW, LCSW   Chief Complaint:      Chief Complaint  Patient presents with  . Depression    Reason For Service:     The patient was referred for services by primary care physician Dr. Tula Nakayama do to patient experiencing symptoms of depression. The patient has a long-standing history of depression. She was referred to this practice for continuity of care as she was seen at [facility] but was having difficulty scheduling a time to see a therapist. Her stressors included marital issues, declining health, family issues, and financial problems. Patient is seen for a follow up appointment today.  Interventions Strategy:  Supportive therapy,   Participation Level:   Active  Participation Quality:  Appropriate      Behavioral Observation:  Casual, Alert, and Appropriate.   Current Psychosocial Factors:   Content of Session:   Reviewing symptoms, processing feelings, identifying ways to express concerns to medical providers, reinforcing patient's efforts to set and maintain boundaries  Current Status:   The patient is experiencing decreased anxiety, decreased worry, and improved mood.  Patient Progress:   Good. Patient reports continuing to feel better and maintaining involvement in activities. She reports continued support from her sister-in-law as well as one of her daughters and her grandson. Patient expresses frustration regarding have an several medical appointments in the past few weeks but reports managing these fairly well. She expresses frustration regarding one of her daughters who serves as a CNA to patient. Per patient's report, she no longer needs her daughter's help as she has been providing more care for self and has self  administered her insulin injections. She also expresses frustration with this daughter as she reports she tends to create conflict between patient and her husband resulting in more anxiety for patient. She also suspects that this daughter is taking food from her home. Patient expresses frustration with her husband as she reports he allows this daughter to cause conflict although patient has discussed concerns with her husband. Therapist and patient discuss ways patient can express her opinions and concerns to medical providers regarding continued care from her daughter. Patient is pleased with her efforts to set and maintain boundaries with her children who have asked patient to purchase a car. Patient states that she does not want to purchase a car that she would not drive. Additionally, patient reports she and her husband already have transportation.   Target Goals:   1.  Decrease anxiety and excessive worry.  Last Reviewed:   08/21/2010  Goals Addressed Today:    Decrease anxiety and excessive worry  Impression/Diagnosis:   The patient has been suffering symptoms of depression intermittently for the past 6-7 years. She continues to experience anxiety, excessive worrying, and depressed mood. Diagnosis: Depressive disorder NOS, anxiety disorder NOS  Diagnosis:  Axis I:  1. Depressive disorder, not elsewhere classified             Axis II: Deferred

## 2012-05-07 NOTE — Patient Instructions (Signed)
Discussed orally 

## 2012-05-12 ENCOUNTER — Other Ambulatory Visit: Payer: Self-pay | Admitting: Family Medicine

## 2012-05-12 ENCOUNTER — Ambulatory Visit (HOSPITAL_COMMUNITY)
Admission: RE | Admit: 2012-05-12 | Discharge: 2012-05-12 | Disposition: A | Discharge status: Home / Self Care | Payer: Medicare Other | Source: Ambulatory Visit | Attending: Neurology | Admitting: Neurology

## 2012-05-12 DIAGNOSIS — R269 Unspecified abnormalities of gait and mobility: Secondary | ICD-10-CM | POA: Insufficient documentation

## 2012-05-12 DIAGNOSIS — IMO0001 Reserved for inherently not codable concepts without codable children: Secondary | ICD-10-CM | POA: Insufficient documentation

## 2012-05-12 DIAGNOSIS — I10 Essential (primary) hypertension: Secondary | ICD-10-CM | POA: Insufficient documentation

## 2012-05-12 DIAGNOSIS — M6281 Muscle weakness (generalized): Secondary | ICD-10-CM | POA: Insufficient documentation

## 2012-05-12 NOTE — Progress Notes (Signed)
Physical Therapy Treatment Patient Details  Name: Laura Atkins MRN: [id number] Date of Birth: December 13, 1940  Today's Date: 05/12/2012 Time: [id number] PT Time Calculation (min): 40 min Visit#: 3  of 12   Re-eval: 05/22/12 Charges:  therex 32'    Subjective: Symptoms/Limitations Symptoms: Pt. arrived 43' late for appt.  states she is currently without pain.  States she had some tests done on her R leg but hasn't heard anything yet.  It has been several weeks since last visit on 04/23/12. Pain Assessment Currently in Pain?: No/denies   Exercise/Treatments Aerobic Stationary Bike: 8'@2 .0 Standing Heel Raises: 10 reps Functional Squat: 10 reps Rocker Board: 2 minutes SLS: 3 trials with max of 11", using UE's Seated Long Arc Quad: 10 reps Other Seated Knee Exercises: hip adduction isometric 10x5" Supine Other Supine Knee Exercises: R PF with mm tapping for NMR 10 reps Other Supine Knee Exercises: eccentric holds 3-5" X 5 reps     Physical Therapy Assessment and Plan PT Assessment and Plan Clinical Impression Statement: Pt. late for appt. and unable to add new activities; able to complete therex; AA with emphasis on eccentric ant tib/strengthening. discussed importance of performing HEP/being compliant with activities. PT Plan: Continue to progress per POC; unsure of prior PTA/PT discussion last visit.     Problem List Patient Active Problem List  Diagnosis  . IDDM  . HYPERLIPIDEMIA  . OBESITY  . ANEMIA  . DEPRESSION  . HYPERTENSION  . GERD  . OSTEOARTHRITIS  . SHOULDER PAIN, RIGHT  . KNEE PAIN, LEFT  . NECK PAIN, CHRONIC  . BACK PAIN, CHRONIC  . DISORDER OF BONE AND CARTILAGE UNSPECIFIED  . HYPERSOMNIA, ASSOCIATED WITH SLEEP APNEA  . TREMOR  . URINARY INCONTINENCE  . UNSTEADY GAIT  . Headache  . Pelvic mass in female  . Dermatomycosis  . Bronchitis  . Paresthesia and pain of right extremity  . Vomiting  . Dehydration  . Generalized weakness  . Orthostatic  hypotension  . Altered mental status  . Acute respiratory failure with hypoxia  . Seizure  . Stress due to marital problems  . Stressful life event affecting family  . Difficulty in walking  . Weakness of both legs  . Balance disorder  . Rash and other nonspecific skin eruption    PT - End of Session Activity Tolerance: Patient tolerated treatment well General Behavior During Session: [facility] for tasks performed Cognition: [facility] for tasks performed   Teena Irani, PTA/CLT 05/12/2012, 11:16 AM

## 2012-05-14 ENCOUNTER — Ambulatory Visit (HOSPITAL_COMMUNITY)
Admission: RE | Admit: 2012-05-14 | Discharge: 2012-05-14 | Disposition: A | Discharge status: Home / Self Care | Payer: Medicare Other | Source: Ambulatory Visit | Attending: Family Medicine | Admitting: Family Medicine

## 2012-05-14 NOTE — Progress Notes (Signed)
Physical Therapy Treatment Patient Details  Name: Laura Atkins MRN: [id number] Date of Birth: 1940/12/17  Today's Date: 05/14/2012 Time: 1117-1215 PT Time Calculation (min): 58 min  Visit#: 4  of 12   Re-eval: 05/22/12 Charges: Therex x 15' NMR x 25'    Subjective: Pt arived 87' late for appt. Pt states she only has pain when she is walking. 8/10 pain in anterior thigh with walking.   Exercise/Treatments Standing Heel Raises: 10 reps Functional Squat: 10 reps Rocker Board: 2 minutes;Limitations Rocker Board Limitations: A/P R/L SLS: L:20" :15" with 1 UE assist Seated Long Arc Quad: 15 reps Other Seated Knee Exercises: hip adduction isometric 10x5" Supine Other Supine Knee Exercises: R DF with mm tapping for NMR 10 reps Other Supine Knee Exercises: DF AAROM x 10  Physical Therapy Assessment and Plan PT Assessment and Plan Clinical Impression Statement: Pt tolerates tx well and continues to respond well to manual facilitation. Discussed estim with Geoffery Lyons, PT. Estim not completed secondary to contraindictation.  PT Plan: Continue to progress per PT POC.     Problem List Patient Active Problem List  Diagnosis  . IDDM  . HYPERLIPIDEMIA  . OBESITY  . ANEMIA  . DEPRESSION  . HYPERTENSION  . GERD  . OSTEOARTHRITIS  . SHOULDER PAIN, RIGHT  . KNEE PAIN, LEFT  . NECK PAIN, CHRONIC  . BACK PAIN, CHRONIC  . DISORDER OF BONE AND CARTILAGE UNSPECIFIED  . HYPERSOMNIA, ASSOCIATED WITH SLEEP APNEA  . TREMOR  . URINARY INCONTINENCE  . UNSTEADY GAIT  . Headache  . Pelvic mass in female  . Dermatomycosis  . Bronchitis  . Paresthesia and pain of right extremity  . Vomiting  . Dehydration  . Generalized weakness  . Orthostatic hypotension  . Altered mental status  . Acute respiratory failure with hypoxia  . Seizure  . Stress due to marital problems  . Stressful life event affecting family  . Difficulty in walking  . Weakness of both legs  . Balance disorder    . Rash and other nonspecific skin eruption    PT - End of Session Activity Tolerance: Patient tolerated treatment well General Behavior During Session: [facility] for tasks performed Cognition: [facility] for tasks performed   Rachelle Hora, PTA 05/14/2012, 3:57 PM

## 2012-05-16 ENCOUNTER — Ambulatory Visit (HOSPITAL_COMMUNITY)
Admission: RE | Admit: 2012-05-16 | Discharge: 2012-05-16 | Disposition: A | Discharge status: Home / Self Care | Payer: Medicare Other | Source: Ambulatory Visit | Attending: Family Medicine | Admitting: Family Medicine

## 2012-05-16 DIAGNOSIS — R29898 Other symptoms and signs involving the musculoskeletal system: Secondary | ICD-10-CM

## 2012-05-16 DIAGNOSIS — R262 Difficulty in walking, not elsewhere classified: Secondary | ICD-10-CM

## 2012-05-16 DIAGNOSIS — R2689 Other abnormalities of gait and mobility: Secondary | ICD-10-CM

## 2012-05-16 NOTE — Progress Notes (Signed)
Physical Therapy Treatment Patient Details  Name: Laura Atkins MRN: [id number] Date of Birth: [birth year]  Today's Date: 05/16/2012 Time: 1127-1215 PT Time Calculation (min): 48 min  Visit#: 5  of 12   Re-eval: 05/22/12    Authorization:   [place] [employer]. Authorization Time Period:    Authorization Visit#:   of     Subjective: Symptoms/Limitations Symptoms: Pt states she is "living".  Pt is doing ex at home but is frustrated due to the fact that she is not improving as she thought she would.   Exercise/Treatments   Aerobic Stationary Bike: 6' @ 2.0 Standing Heel Raises: 10 reps;Limitations Heel Raises Limitations: toe raises x 10 Functional Squat: 15 reps Rocker Board: 2 minutes Rocker Board Limitations: A/P; R/L SLS: x 3 w/1 finger hold Seated Long Arc Quad: Strengthening;10 reps;Weights Long Arc Quad Weight: 3 lbs. Supine Bridges: 10 reps Other Supine Knee Exercises: AA DF/PF Other Supine Knee Exercises: tapping  Sidelying Hip ABduction: Both;10 reps     Physical Therapy Assessment and Plan PT Assessment and Plan Clinical Impression Statement: Pt has slight increased motion in DF.  Continued to have weakened  mm throughout.   PT Plan: Continue to stimulate DF mm ; begin sit to stand next treatment.   Goals    Problem List Patient Active Problem List  Diagnosis  . IDDM  . HYPERLIPIDEMIA  . OBESITY  . ANEMIA  . DEPRESSION  . HYPERTENSION  . GERD  . OSTEOARTHRITIS  . SHOULDER PAIN, RIGHT  . KNEE PAIN, LEFT  . NECK PAIN, CHRONIC  . BACK PAIN, CHRONIC  . DISORDER OF BONE AND CARTILAGE UNSPECIFIED  . HYPERSOMNIA, ASSOCIATED WITH SLEEP APNEA  . TREMOR  . URINARY INCONTINENCE  . UNSTEADY GAIT  . Headache  . Pelvic mass in female  . Dermatomycosis  . Bronchitis  . Paresthesia and pain of right extremity  . Vomiting  . Dehydration  . Generalized weakness  . Orthostatic hypotension  . Altered mental status  . Acute respiratory failure with  hypoxia  . Seizure  . Stress due to marital problems  . Stressful life event affecting family  . Difficulty in walking  . Weakness of both legs  . Balance disorder  . Rash and other nonspecific skin eruption    PT - End of Session Equipment Utilized During Treatment: Gait belt Activity Tolerance: Patient tolerated treatment well General Behavior During Session: [facility] for tasks performed Cognition: [facility] for tasks performed  GP    RUSSELL,CINDY 05/16/2012, 12:21 PM

## 2012-05-19 ENCOUNTER — Ambulatory Visit (INDEPENDENT_AMBULATORY_CARE_PROVIDER_SITE_OTHER): Payer: Medicare Other | Admitting: Ophthalmology

## 2012-05-19 ENCOUNTER — Ambulatory Visit (HOSPITAL_COMMUNITY): Payer: Self-pay | Admitting: *Deleted

## 2012-05-21 ENCOUNTER — Ambulatory Visit (HOSPITAL_COMMUNITY)
Admission: RE | Admit: 2012-05-21 | Discharge: 2012-05-21 | Disposition: A | Discharge status: Home / Self Care | Payer: Medicare Other | Source: Ambulatory Visit | Attending: Family Medicine | Admitting: Family Medicine

## 2012-05-21 DIAGNOSIS — R262 Difficulty in walking, not elsewhere classified: Secondary | ICD-10-CM

## 2012-05-21 DIAGNOSIS — R29898 Other symptoms and signs involving the musculoskeletal system: Secondary | ICD-10-CM

## 2012-05-21 DIAGNOSIS — R2689 Other abnormalities of gait and mobility: Secondary | ICD-10-CM

## 2012-05-21 NOTE — Progress Notes (Signed)
Physical Therapy Treatment Patient Details  Name: Laura Atkins MRN: [id number] Date of Birth: 11/28/1940  Today's Date: 05/21/2012 Time: 1106-1204 PT Time Calculation (min): 58 min  Visit#: 6  of 12   Re-eval: 05/22/12  Charge: therex 50 min  Authorization: [employer]   Subjective: Symptoms/Limitations Symptoms: Pt stated R posterior knee pain, has been completing HEP but c/o cramps in R thigh. Pain Assessment Currently in Pain?: Yes Pain Score:   5 Pain Location: Knee Pain Orientation: Right;Posterior  Objective:   Exercise/Treatments Aerobic Stationary Bike: 6' @ 2.0 Standing Heel Raises: 15 reps Functional Squat: 15 reps Rocker Board: 2 minutes Rocker Board Limitations: A/P; R/L SLS: 3 attempts w/ 1 finger holds x 30" Seated Long Arc Quad: 15 reps;Weights Long Arc Quad Weight: 3 lbs. Other Seated Knee Exercises: sit to stand 5 x with min assistance 21 in height Supine Other Supine Knee Exercises: AAROM DF/ PF 2 sets/ 10 reps with tapping TA    Physical Therapy Assessment and Plan PT Assessment and Plan Clinical Impression Statement: Began sit to stand with min assistance at mat height 21 in, required vc-ing for proper technique.  Pt with weak DF musculature, able to increase contraction with manual tapping. PT Plan: Re-eval next session.    Goals    Problem List Patient Active Problem List  Diagnosis  . IDDM  . HYPERLIPIDEMIA  . OBESITY  . ANEMIA  . DEPRESSION  . HYPERTENSION  . GERD  . OSTEOARTHRITIS  . SHOULDER PAIN, RIGHT  . KNEE PAIN, LEFT  . NECK PAIN, CHRONIC  . BACK PAIN, CHRONIC  . DISORDER OF BONE AND CARTILAGE UNSPECIFIED  . HYPERSOMNIA, ASSOCIATED WITH SLEEP APNEA  . TREMOR  . URINARY INCONTINENCE  . UNSTEADY GAIT  . Headache  . Pelvic mass in female  . Dermatomycosis  . Bronchitis  . Paresthesia and pain of right extremity  . Vomiting  . Dehydration  . Generalized weakness  . Orthostatic hypotension  .  Altered mental status  . Acute respiratory failure with hypoxia  . Seizure  . Stress due to marital problems  . Stressful life event affecting family  . Difficulty in walking  . Weakness of both legs  . Balance disorder  . Rash and other nonspecific skin eruption    PT - End of Session Equipment Utilized During Treatment: Gait belt Activity Tolerance: Patient tolerated treatment well General Behavior During Session: [facility] for tasks performed Cognition: [facility] for tasks performed  GP    Aldona Lento 05/21/2012, 12:15 PM

## 2012-05-22 ENCOUNTER — Encounter: Payer: Self-pay | Admitting: Family Medicine

## 2012-05-22 ENCOUNTER — Ambulatory Visit (INDEPENDENT_AMBULATORY_CARE_PROVIDER_SITE_OTHER): Payer: Medicare Other | Admitting: Family Medicine

## 2012-05-22 VITALS — BP 140/60 | HR 83 | Resp 16 | Ht 67.5 in | Wt 266.0 lb

## 2012-05-22 DIAGNOSIS — F329 Major depressive disorder, single episode, unspecified: Secondary | ICD-10-CM

## 2012-05-22 DIAGNOSIS — I10 Essential (primary) hypertension: Secondary | ICD-10-CM

## 2012-05-22 DIAGNOSIS — E109 Type 1 diabetes mellitus without complications: Secondary | ICD-10-CM

## 2012-05-22 DIAGNOSIS — I889 Nonspecific lymphadenitis, unspecified: Secondary | ICD-10-CM | POA: Insufficient documentation

## 2012-05-22 MED ORDER — SULFAMETHOXAZOLE-TRIMETHOPRIM 800-160 MG PO TABS: 1.0000 | ORAL_TABLET | Freq: Two times a day (BID) | ORAL | Status: AC

## 2012-05-22 NOTE — Patient Instructions (Addendum)
Annual wellness exam in November.Please bring all medication to next visit Antibiotics are prescribed for tender swollen glands in left arm pit.   If  you continue to have pain in left arm and breast , please call I will refer you for a mammogram sooner than due, which is in November  Congrats on improved blood sugars

## 2012-05-23 ENCOUNTER — Inpatient Hospital Stay (HOSPITAL_COMMUNITY): Admission: RE | Admit: 2012-05-23 | Payer: Self-pay | Source: Ambulatory Visit | Admitting: Physical Therapy

## 2012-05-26 NOTE — Progress Notes (Signed)
  Subjective:    Patient ID: Laura Atkins, female    DOB: 1940/10/20, 70 y.o.   MRN: [id number]  HPI C/o pain and redness under left armpit, no fever or drainage, feels swelling also. States her blood sugars have greatly improved, and she is happy about this. Less depressed and following with mental health. Children are all very actively involved in her care   Review of Systems See HPI Denies recent fever or chills. Denies sinus pressure, nasal congestion, ear pain or sore throat. Denies chest congestion, productive cough or wheezing. Denies chest pains, palpitations and leg swelling Denies abdominal pain, nausea, vomiting,diarrhea or constipation.   Denies dysuria, frequency, hesitancy or incontinence. Chronic  joint pain, swelling and limitation in mobility. Denies headaches, seizures, numbness, or tingling. Denies uncontrolled depression, anxiety or insomnia.         Objective:   Physical Exam  Patient alert and oriented and in no cardiopulmonary distress.  HEENT: No facial asymmetry, EOMI, no sinus tenderness,  oropharynx pink and moist.  Neck supple no adenopathy.  Chest: Clear to auscultation bilaterally.  CVS: S1, S2 no murmurs, no S3.  ABD: Soft non tender. Bowel sounds normal.  Ext: No edema  MS: decreased  ROM spine, shoulders, hips and knees.  Skin: Intact, tender lymph gland in left axilla, which is mildly erythematous and warm.  Psych: Good eye contact, normal affect. Memory loss,not anxious or depressed appearing.  CNS: CN 2-12 intact, power,  normal throughout.       Assessment & Plan:

## 2012-05-26 NOTE — Assessment & Plan Note (Signed)
Antibiotic prescribed due to symptoms

## 2012-05-26 NOTE — Assessment & Plan Note (Signed)
Improved, treated soly by mental health

## 2012-05-26 NOTE — Assessment & Plan Note (Signed)
Marked improvement pt to continue with endo

## 2012-05-26 NOTE — Assessment & Plan Note (Signed)
Sub optimal control, no med change, lifestyle modification with weight loss stressed

## 2012-05-27 ENCOUNTER — Other Ambulatory Visit: Payer: Self-pay | Admitting: Family Medicine

## 2012-05-27 ENCOUNTER — Telehealth: Payer: Self-pay | Admitting: Family Medicine

## 2012-05-27 DIAGNOSIS — M79622 Pain in left upper arm: Secondary | ICD-10-CM

## 2012-05-27 DIAGNOSIS — N644 Mastodynia: Secondary | ICD-10-CM

## 2012-05-27 NOTE — Telephone Encounter (Signed)
pls schedule pt for diagnostic left  mammogram evaluate left  breast pain, pls gise her apptdate , I am entering the order also

## 2012-05-27 NOTE — Telephone Encounter (Signed)
Pt wanted you to be aware. Earlier mammogram?

## 2012-05-27 NOTE — Telephone Encounter (Signed)
Pt has appt at [facility] for 06/04/2012 8:15. Pt aware

## 2012-05-29 ENCOUNTER — Ambulatory Visit (HOSPITAL_COMMUNITY)
Admission: RE | Admit: 2012-05-29 | Discharge: 2012-05-29 | Disposition: A | Discharge status: Home / Self Care | Payer: Medicare Other | Source: Ambulatory Visit | Attending: Neurology | Admitting: Neurology

## 2012-05-29 DIAGNOSIS — R269 Unspecified abnormalities of gait and mobility: Secondary | ICD-10-CM | POA: Insufficient documentation

## 2012-05-29 DIAGNOSIS — I10 Essential (primary) hypertension: Secondary | ICD-10-CM | POA: Insufficient documentation

## 2012-05-29 DIAGNOSIS — IMO0001 Reserved for inherently not codable concepts without codable children: Secondary | ICD-10-CM | POA: Insufficient documentation

## 2012-05-29 DIAGNOSIS — M6281 Muscle weakness (generalized): Secondary | ICD-10-CM | POA: Insufficient documentation

## 2012-05-29 NOTE — Evaluation (Signed)
Physical Therapy Re-evaluation  Patient Details  Name: Laura Atkins MRN: [id number] Date of Birth: [birth year]  Today's Date: 05/29/2012 Time: [id number] PT Time Calculation (min): 52 min  Visit#: 7  of 24   Re-eval: 06/19/12 Charges: MMT x 1 PPT x 15' Self care x 15'    Past Medical History:  Past Medical History  Diagnosis Date  . Hypertension   . Diabetes mellitus   . Hyperlipidemia   . Obesity   . Anemia   . Anxiety   . Depression   . GERD (gastroesophageal reflux disease)   . Pruritus   . Osteoarthritis   . Right shoulder pain   . Left knee pain   . Hypersomnia   . Urinary incontinence    Past Surgical History:  Past Surgical History  Procedure Date  . Abdominal hysterectomy   . Cyst removed left breast   . Cholecystectomy     Subjective Symptoms/Limitations Symptoms: Pt states that she feels her R knee and hip feel stronger but has not noticed a big change in her R ankle. Pain Assessment Currently in Pain?: Yes Pain Score:   6 Pain Location: Ankle Pain Orientation: Right   Sensation/Coordination/Flexibility/Functional Tests Functional Tests Functional Tests: LEFS 20/64 (was 1/64)  Assessment RLE Strength Right Hip Flexion: 4/5 (was 3+/5) Right Hip Extension: 3-/5 (was 2+/5) Right Hip ABduction: 4/5 (was 3-/5) Right Hip ADduction:  (4+/5 was 2+/5) Right Knee Flexion:  (4+/5 was 4/5) Right Knee Extension: 5/5 (was 4/5) Right Ankle Dorsiflexion: 1/5 (was 1/5) Right Ankle Plantar Flexion: 2+/5 (was 2-/5) LLE Strength Left Hip Flexion:  (4+/5 was 3+/5) Left Hip Extension: 3-/5 (was 2+/5) Left Hip ABduction: 5/5 (was 2+/5) Left Hip ADduction:  (4+/5 was 4/5) Left Knee Flexion: 5/5 (was 5/5) Left Knee Extension: 5/5 (was 3+/5) Left Ankle Dorsiflexion: 4/5 (3+/5) Left Ankle Plantar Flexion: 3/5 (was 3-/5)  Exercise/Treatments Mobility/Balance  Berg Balance Test Sit to Stand: Able to stand  independently using hands Standing Unsupported: Able  to stand 2 minutes with supervision Sitting with Back Unsupported but Feet Supported on Floor or Stool: Able to sit safely and securely 2 minutes Stand to Sit: Uses backs of legs against chair to control descent Transfers: Able to transfer safely, definite need of hands Standing Unsupported with Eyes Closed: Able to stand 10 seconds with supervision Standing Ubsupported with Feet Together: Needs help to attain position but able to stand for 30 seconds with feet together From Standing, Reach Forward with Outstretched Arm: Can reach forward >12 cm safely (5") From Standing Position, Pick up Object from Floor: Able to pick up shoe, needs supervision From Standing Position, Turn to Look Behind Over each Shoulder: Looks behind one side only/other side shows less weight shift Turn 360 Degrees: Needs close supervision or verbal cueing Standing Unsupported, Alternately Place Feet on Step/Stool: Able to complete >2 steps/needs minimal assist Standing Unsupported, One Foot in Front: Needs help to step but can hold 15 seconds Standing on One Leg: Unable to try or needs assist to prevent fall Total Score: 31     Physical Therapy Assessment and Plan PT Assessment and Plan Clinical Impression Statement: Pt presents with improved function, hips and knee strength, and balance. Pt continues to be limited by dorsiflexor weakness. Pt would benefit from continuing therapy to continue to improve strength, gait and balance. Pt is interested in getting an AFO. PT Plan: Recommend to continue x 4 more weeks to improve strength, gait and balance.     Problem List  Patient Active Problem List  Diagnosis  . IDDM  . HYPERLIPIDEMIA  . OBESITY  . ANEMIA  . DEPRESSION  . HYPERTENSION  . GERD  . OSTEOARTHRITIS  . SHOULDER PAIN, RIGHT  . KNEE PAIN, LEFT  . NECK PAIN, CHRONIC  . BACK PAIN, CHRONIC  . DISORDER OF BONE AND CARTILAGE UNSPECIFIED  . HYPERSOMNIA, ASSOCIATED WITH SLEEP APNEA  . TREMOR  . URINARY  INCONTINENCE  . UNSTEADY GAIT  . Headache  . Pelvic mass in female  . Dermatomycosis  . Bronchitis  . Paresthesia and pain of right extremity  . Vomiting  . Dehydration  . Generalized weakness  . Orthostatic hypotension  . Altered mental status  . Acute respiratory failure with hypoxia  . Seizure  . Stress due to marital problems  . Stressful life event affecting family  . Difficulty in walking  . Weakness of both legs  . Balance disorder  . Rash and other nonspecific skin eruption  . Axillary adenitis  GP [id number] CJ  PT - End of Session Equipment Utilized During Treatment: Gait belt Activity Tolerance: Patient tolerated treatment well General Behavior During Session: [facility] for tasks performed Cognition: [facility] for tasks performed  Rachelle Hora, PTA 05/29/2012, 4:23 PM  Physician Documentation Your signature is required to indicate approval of the treatment plan as stated above.  Please sign and either send electronically or make a copy of this report for your files and return this physician signed original.   Please mark one 1.__approve of plan  2. ___approve of plan with the following conditions.   ______________________________                                                          _____________________ Physician Signature                                                                                                             Date

## 2012-05-30 ENCOUNTER — Other Ambulatory Visit: Payer: Self-pay | Admitting: Family Medicine

## 2012-06-03 ENCOUNTER — Telehealth: Payer: Self-pay | Admitting: Family Medicine

## 2012-06-04 ENCOUNTER — Ambulatory Visit (HOSPITAL_COMMUNITY)
Admission: RE | Admit: 2012-06-04 | Discharge: 2012-06-04 | Disposition: A | Discharge status: Home / Self Care | Payer: Medicare Other | Source: Ambulatory Visit | Attending: Family Medicine | Admitting: Family Medicine

## 2012-06-04 ENCOUNTER — Ambulatory Visit (INDEPENDENT_AMBULATORY_CARE_PROVIDER_SITE_OTHER): Payer: Medicare Other | Admitting: Ophthalmology

## 2012-06-04 ENCOUNTER — Ambulatory Visit (HOSPITAL_COMMUNITY): Payer: Self-pay | Admitting: Physical Therapy

## 2012-06-04 ENCOUNTER — Other Ambulatory Visit (HOSPITAL_COMMUNITY): Payer: Self-pay | Admitting: Family Medicine

## 2012-06-04 DIAGNOSIS — M79622 Pain in left upper arm: Secondary | ICD-10-CM

## 2012-06-04 DIAGNOSIS — N644 Mastodynia: Secondary | ICD-10-CM

## 2012-06-04 DIAGNOSIS — N63 Unspecified lump in unspecified breast: Secondary | ICD-10-CM | POA: Insufficient documentation

## 2012-06-05 ENCOUNTER — Inpatient Hospital Stay (HOSPITAL_COMMUNITY): Admission: RE | Admit: 2012-06-05 | Payer: Self-pay | Source: Ambulatory Visit | Admitting: Physical Therapy

## 2012-06-06 ENCOUNTER — Inpatient Hospital Stay (HOSPITAL_COMMUNITY): Admission: RE | Admit: 2012-06-06 | Payer: Self-pay | Source: Ambulatory Visit | Admitting: Physical Therapy

## 2012-06-06 NOTE — Telephone Encounter (Signed)
Called pt phone busy x 2

## 2012-06-10 ENCOUNTER — Ambulatory Visit (HOSPITAL_COMMUNITY): Payer: Self-pay | Admitting: *Deleted

## 2012-06-11 ENCOUNTER — Ambulatory Visit (INDEPENDENT_AMBULATORY_CARE_PROVIDER_SITE_OTHER): Payer: Medicare Other | Admitting: Ophthalmology

## 2012-06-11 DIAGNOSIS — H43819 Vitreous degeneration, unspecified eye: Secondary | ICD-10-CM

## 2012-06-11 DIAGNOSIS — H2510 Age-related nuclear cataract, unspecified eye: Secondary | ICD-10-CM

## 2012-06-11 DIAGNOSIS — E1139 Type 2 diabetes mellitus with other diabetic ophthalmic complication: Secondary | ICD-10-CM

## 2012-06-11 DIAGNOSIS — H35039 Hypertensive retinopathy, unspecified eye: Secondary | ICD-10-CM

## 2012-06-11 DIAGNOSIS — E11319 Type 2 diabetes mellitus with unspecified diabetic retinopathy without macular edema: Secondary | ICD-10-CM

## 2012-06-11 DIAGNOSIS — I10 Essential (primary) hypertension: Secondary | ICD-10-CM

## 2012-06-11 DIAGNOSIS — E1165 Type 2 diabetes mellitus with hyperglycemia: Secondary | ICD-10-CM

## 2012-06-12 ENCOUNTER — Ambulatory Visit (HOSPITAL_COMMUNITY): Payer: Self-pay | Admitting: *Deleted

## 2012-06-13 NOTE — Progress Notes (Signed)
EMG/NCS reveal sensori motor neuropathy with axonal greater than demyelinating neuropathy.  Also suggestion of a right peroneal neuropathy at the right fibular head.  Chronic LS radiculopathy cannot be excluded.

## 2012-06-16 ENCOUNTER — Ambulatory Visit (HOSPITAL_COMMUNITY): Payer: Self-pay | Admitting: *Deleted

## 2012-06-16 ENCOUNTER — Encounter: Payer: Self-pay | Admitting: Neurology

## 2012-06-17 ENCOUNTER — Ambulatory Visit (INDEPENDENT_AMBULATORY_CARE_PROVIDER_SITE_OTHER): Payer: Medicare Other | Admitting: Family Medicine

## 2012-06-17 ENCOUNTER — Encounter: Payer: Self-pay | Admitting: Family Medicine

## 2012-06-17 ENCOUNTER — Ambulatory Visit (HOSPITAL_COMMUNITY): Payer: Self-pay | Admitting: Psychiatry

## 2012-06-17 VITALS — BP 140/70 | HR 83 | Resp 16 | Ht 67.5 in | Wt 269.0 lb

## 2012-06-17 DIAGNOSIS — E785 Hyperlipidemia, unspecified: Secondary | ICD-10-CM

## 2012-06-17 DIAGNOSIS — Z23 Encounter for immunization: Secondary | ICD-10-CM

## 2012-06-17 DIAGNOSIS — E109 Type 1 diabetes mellitus without complications: Secondary | ICD-10-CM

## 2012-06-17 DIAGNOSIS — F329 Major depressive disorder, single episode, unspecified: Secondary | ICD-10-CM

## 2012-06-17 DIAGNOSIS — M949 Disorder of cartilage, unspecified: Secondary | ICD-10-CM

## 2012-06-17 DIAGNOSIS — Z13820 Encounter for screening for osteoporosis: Secondary | ICD-10-CM

## 2012-06-17 DIAGNOSIS — L909 Atrophic disorder of skin, unspecified: Secondary | ICD-10-CM

## 2012-06-17 DIAGNOSIS — I10 Essential (primary) hypertension: Secondary | ICD-10-CM

## 2012-06-17 DIAGNOSIS — M899 Disorder of bone, unspecified: Secondary | ICD-10-CM

## 2012-06-17 DIAGNOSIS — L918 Other hypertrophic disorders of the skin: Secondary | ICD-10-CM | POA: Insufficient documentation

## 2012-06-17 DIAGNOSIS — E669 Obesity, unspecified: Secondary | ICD-10-CM

## 2012-06-17 NOTE — Telephone Encounter (Signed)
Pt in office this am for OV

## 2012-06-17 NOTE — Progress Notes (Signed)
  Subjective:    Patient ID: Laura Atkins, female    DOB: 1941/04/07, 71 y.o.   MRN: [id number]  HPI The PT is here for follow up and re-evaluation of chronic medical conditions, medication management and review of any available recent lab and radiology data.  Preventive health is updated, specifically  Cancer screening and Immunization.   Questions or concerns regarding consultations or procedures which the PT has had in the interim are  Addressed.States she is no longer going to pT  For her foot drop due to cost, and also alludes to wanting to "change her neurologist  no reason clearly stated, I believe she is of the impression that the Doc she started with will be leaving the practice, not sure how important this is in her thought process. Asked about driving, I made it clear again that this will be the decision of the neurologist based on her recent history The PT denies any adverse reactions to current medications since the last visit.  Recent hBa1c is excellent for which she is heartily applauded      Review of Systems See HPI Denies recent fever or chills. Denies sinus pressure, nasal congestion, ear pain or sore throat. Denies chest congestion, productive cough or wheezing. Denies chest pains, palpitations and leg swelling Denies abdominal pain, nausea, vomiting,diarrhea or constipation.   Denies dysuria, frequency, hesitancy or incontinence. Chronic  joint pain, swelling and limitation in mobility. Denies headaches, seizures, numbness, or tingling. Denies depression, anxiety or insomnia. Denies skin break down or rash.c/o troublesome skin tag which she wants removed        Objective:   Physical Exam  Patient alert and oriented and in no cardiopulmonary distress.  HEENT: No facial asymmetry, EOMI, no sinus tenderness,  oropharynx pink and moist.  Neck decreased ROM  no adenopathy.  Chest: Clear to auscultation bilaterally.  CVS: S1, S2 no murmurs, no S3.  ABD:  Soft non tender. Bowel sounds normal.  Ext: No edema  MS: Decreased  ROM spine, shoulders, hips and knees.Abnormal gait , with foot drop  Skin: Intact, no ulcerations or rash noted.Prominent skin tag   Psych: Good eye contact, normal affect. Memory impaired  not anxious or depressed appearing.  CNS: CN 2-12 intact, power,  normal throughout.       Assessment & Plan:

## 2012-06-17 NOTE — Patient Instructions (Addendum)
Annual wellness end January    Congrats  On excellent blood sugar continue to keep it up   Your mammogram was normal  Flu vaccine today, please check at pharmacy for shingles vaccine , you will get a script, please get vaccine if able  Fasting lipid, cmp anf EGFR, Vit D  Bone density scan due  Keep appt for eye exam , you are referred for skin tag removal

## 2012-06-19 ENCOUNTER — Telehealth: Payer: Self-pay | Admitting: Neurology

## 2012-06-19 ENCOUNTER — Telehealth: Payer: Self-pay | Admitting: Family Medicine

## 2012-06-19 ENCOUNTER — Ambulatory Visit (HOSPITAL_COMMUNITY): Payer: Self-pay | Admitting: *Deleted

## 2012-06-19 NOTE — Telephone Encounter (Signed)
Pt left message for "test results" done while Dr. Jacelyn Grip was here. She also mentioned something about rescheduling an appointment, but she wasn't sure which appointment it was or if she needed it. Please call and advise.

## 2012-06-19 NOTE — Telephone Encounter (Signed)
Called and spoke with the patient. She requested that I send her test results to her PCP, Dr. Moshe Cipro in [place]. Faxed the EEG, NCV/EMG and neuro psych eval results to [phone]. The patient was advised to have Dr. Moshe Cipro refer her to another neurologist as Dr. Jacelyn Grip is now practicing at [facility]. She states she will.

## 2012-06-20 NOTE — Telephone Encounter (Signed)
Pt is established in a clinic.  Not sure why she wants to change, we had discussed that a new neurologist in the office will be seeing her since previous had left.  Pls see if you can verify that she actually WANTS to see a new provider, and specifically who , because there is limitation in neurology availability, and will have to wait for re establishment, pls let me know

## 2012-06-23 NOTE — Telephone Encounter (Signed)
Will try to see the Dr already in the clinic

## 2012-06-24 ENCOUNTER — Ambulatory Visit (HOSPITAL_COMMUNITY): Payer: Self-pay | Admitting: Psychiatry

## 2012-06-26 ENCOUNTER — Ambulatory Visit (INDEPENDENT_AMBULATORY_CARE_PROVIDER_SITE_OTHER): Payer: Medicare Other | Admitting: Psychiatry

## 2012-06-26 ENCOUNTER — Other Ambulatory Visit: Payer: Self-pay | Admitting: Family Medicine

## 2012-06-26 ENCOUNTER — Encounter (HOSPITAL_COMMUNITY): Payer: Self-pay | Admitting: Psychiatry

## 2012-06-26 DIAGNOSIS — F329 Major depressive disorder, single episode, unspecified: Secondary | ICD-10-CM

## 2012-06-26 MED ORDER — SERTRALINE HCL 50 MG PO TABS: ORAL_TABLET | ORAL | Status: DC
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
## 2012-07-01 NOTE — Assessment & Plan Note (Signed)
Marked improvement and being treated by endo

## 2012-07-01 NOTE — Assessment & Plan Note (Signed)
Prominent skin tag on nasal fold, referred to derm for removal

## 2012-07-01 NOTE — Assessment & Plan Note (Signed)
Controlled, no change in medication Hyperlipidemia:Low fat diet discussed and encouraged.  \ 

## 2012-07-01 NOTE — Assessment & Plan Note (Signed)
Improved and stable sees mental health regularly

## 2012-07-01 NOTE — Assessment & Plan Note (Signed)
Sub optimal control, not at goal and on many meds. No change, DASH diet and commitment to daily physical activity for a minimum of 30 minutes discussed and encouraged, as a part of hypertension management. The importance of attaining a healthy weight is also discussed.

## 2012-07-07 ENCOUNTER — Ambulatory Visit (INDEPENDENT_AMBULATORY_CARE_PROVIDER_SITE_OTHER): Payer: Medicare Other | Admitting: Psychiatry

## 2012-07-07 DIAGNOSIS — F329 Major depressive disorder, single episode, unspecified: Secondary | ICD-10-CM

## 2012-07-07 NOTE — Patient Instructions (Signed)
Discussed orally 

## 2012-07-07 NOTE — Progress Notes (Signed)
Patient:  Laura Atkins   DOB: 08/14/1941  MR Number: [id number]  Location: [place]:  [address] [place],  [place], [address]  Start: Monday 07/07/2012 10:00 AM End: Monday 07/07/2012 10:50 AM  Provider/Observer:     Maurice Small, MSW, LCSW   Chief Complaint:      Chief Complaint  Patient presents with  . Depression    Reason For Service:     The patient was referred for services by primary care physician Dr. Tula Nakayama do to patient experiencing symptoms of depression. The patient has a long-standing history of depression. She was referred to this practice for continuity of care as she was seen at [facility] but was having difficulty scheduling a time to see a therapist. Her stressors included marital issues, declining health, family issues, and financial problems. Patient is seen for a follow up appointment today.  Interventions Strategy:  Supportive therapy,   Participation Level:   Active  Participation Quality:  Appropriate      Behavioral Observation:  Casual, Alert, and Appropriate., talkative  Current Psychosocial Factors: Patient is concerned about son being in the relationship with his new girlfriend.  Content of Session:   Reviewing symptoms, processing feelings, identifying areas within patient's control, working with patient and husband to facilitate more support for patient, identifying ways to respect others' boundaries, identifying ways to improve self-care  Current Status:   The patient is experiencing improved mood but increased anxiety and worry.  Patient Progress:   Good. Patient reports continuing to feel better and maintaining involvement in activities. However, she reports experiencing increased anxiety when she is home alone at night. Her daughter is not staying with patient as frequently as she has in the past. Therapist works with patient and her husband to identify ways to ensure patient will not be alone at home at night. Patient  also expresses worry about her son who is in a new relationship. Patient expresses disapproval of her son's girlfriend and states she feels as though he's making a mistake. Therapist works with patient to process her feelings as well as ways to respect her son's boundaries.  Target Goals:   1.  Decrease anxiety and excessive worry.  Last Reviewed:   08/21/2010  Goals Addressed Today:    Decrease anxiety and excessive worry  Impression/Diagnosis:   The patient has been suffering symptoms of depression intermittently for the past 6-7 years. She continues to experience anxiety, excessive worrying, and depressed mood. Diagnosis: Depressive disorder NOS, anxiety disorder NOS  Diagnosis:  Axis I:  1. Depressive disorder             Axis II: Deferred

## 2012-07-08 ENCOUNTER — Telehealth: Payer: Self-pay | Admitting: Family Medicine

## 2012-07-08 ENCOUNTER — Other Ambulatory Visit: Payer: Self-pay | Admitting: Family Medicine

## 2012-07-08 DIAGNOSIS — I2510 Atherosclerotic heart disease of native coronary artery without angina pectoris: Secondary | ICD-10-CM

## 2012-07-08 NOTE — Telephone Encounter (Signed)
Spoke with pt states front desk staff at the office advised she see another Doc as current neurologist who will take over her care specializes in Parkinson's. I advised her i will speak directly with the Doc/Janet who she states  as this does not seem appropriate

## 2012-07-08 NOTE — Telephone Encounter (Signed)
Please let pt know that I spoke directly with the neurology office and now understand that she will nheed to see a different neurologist, since their current Doc specializes only in movement disorders, as she was told. I have entered the referral for [facility] neurology, please set up an appointment and let her know

## 2012-07-11 ENCOUNTER — Other Ambulatory Visit: Payer: Self-pay | Admitting: Family Medicine

## 2012-07-17 ENCOUNTER — Telehealth: Payer: Self-pay | Admitting: Family Medicine

## 2012-07-17 NOTE — Telephone Encounter (Signed)
DO YOU THINK THAT WOULD HELP?

## 2012-07-17 NOTE — Telephone Encounter (Signed)
No PT will not help this, she should schedule with Dr. Moshe Cipro to discuss this

## 2012-07-18 NOTE — Telephone Encounter (Signed)
luann to schedule her an appt to come in

## 2012-07-18 NOTE — Telephone Encounter (Signed)
3 weeks, she stopped PT and thought she was going to go to the [facility]. Started getting numbness in her toes on both feet. Bought circulation stockings, getting worse. Advised to make appt to come in

## 2012-07-18 NOTE — Telephone Encounter (Signed)
Called pt back, number busy

## 2012-07-22 ENCOUNTER — Ambulatory Visit: Payer: Self-pay | Admitting: Family Medicine

## 2012-08-04 ENCOUNTER — Ambulatory Visit (INDEPENDENT_AMBULATORY_CARE_PROVIDER_SITE_OTHER): Payer: Medicare Other | Admitting: Psychiatry

## 2012-08-04 DIAGNOSIS — F329 Major depressive disorder, single episode, unspecified: Secondary | ICD-10-CM

## 2012-08-04 NOTE — Patient Instructions (Signed)
Discussed orally 

## 2012-08-04 NOTE — Progress Notes (Signed)
Patient:  Laura Atkins   DOB: 1941/04/26  MR Number: [id number]  Location: [place]:  [address] [place] [address] [place],  [place], [address]  Start: Monday 08/04/2012 10:10 AM End: Monday 08/04/2012 10:55 AM  Provider/Observer:     Maurice Small, MSW, LCSW   Chief Complaint:      Chief Complaint  Patient presents with  . Depression  . Anxiety    Reason For Service:     The patient was referred for services by primary care physician Dr. Tula Nakayama do to patient experiencing symptoms of depression. The patient has a long-standing history of depression. She was referred to this practice for continuity of care as she was seen at [facility] but was having difficulty scheduling a time to see a therapist. Her stressors included marital issues, declining health, family issues, and financial problems. Patient is seen for a follow up appointment today.  Interventions Strategy:  Supportive therapy, cognitive behavioral therapy  Participation Level:   Active  Participation Quality:  Appropriate      Behavioral Observation:  Casual, Alert, and Appropriate., talkative  Current Psychosocial Factors: Patient is concerned about leg not healing quickly.  Patient also concerned about daughter having a stressful job and suffering from migraines.  Content of Session:   Reviewing symptoms, processing patient's feelings regarding diagnosis of diabetes and  identifying areas within patient's control regarding management   Current Status:   The patient reports depressed mood, anxiety, and worry.  Patient Progress:   Fair. Patient states being kind of depressed and do to problems with her foot dropping. She states being frustrated and irritated with herself as she has to move more slowly and deliberately to avoid falling. She also reports recently having a nerve conduction study which indicated that patient should be able to walk normally again if she participates in physical therapy per patient's  report. Patient expresses concern about her knee and leg failing to heal quickly at the site where she had needles inserted for the conduction study. Patient fears the sites will become infected and her leg may have to be amputated. Patient and therapist discussed patient's concerns as well as the effects of diabetes patient's life and daily functioning.. Therapist and patient also explore patient's thought patterns and the effects on patient's mood and behavior. Patient also expresses worry about her daughter who has a stressful job and suffers from migraines. Patient is pleased that her family has been supportive and has taken steps to ensure that patient is not home alone at night.  Target Goals:   1.  Decrease anxiety and excessive worry.  Last Reviewed:   08/21/2010  Goals Addressed Today:    Decrease anxiety and excessive worry  Impression/Diagnosis:   The patient has been suffering symptoms of depression intermittently for the past 6-7 years. She continues to experience anxiety, excessive worrying, and depressed mood. Diagnosis: Depressive disorder NOS, anxiety disorder NOS  Diagnosis:  Axis I:  1. Depressive disorder             Axis II: Deferred

## 2012-08-05 ENCOUNTER — Ambulatory Visit (HOSPITAL_COMMUNITY)
Admission: RE | Admit: 2012-08-05 | Discharge: 2012-08-05 | Disposition: A | Discharge status: Home / Self Care | Payer: Medicare Other | Source: Ambulatory Visit | Attending: Neurology | Admitting: Neurology

## 2012-08-05 DIAGNOSIS — I10 Essential (primary) hypertension: Secondary | ICD-10-CM | POA: Insufficient documentation

## 2012-08-05 DIAGNOSIS — M6281 Muscle weakness (generalized): Secondary | ICD-10-CM | POA: Insufficient documentation

## 2012-08-05 DIAGNOSIS — R269 Unspecified abnormalities of gait and mobility: Secondary | ICD-10-CM | POA: Insufficient documentation

## 2012-08-05 DIAGNOSIS — IMO0001 Reserved for inherently not codable concepts without codable children: Secondary | ICD-10-CM | POA: Insufficient documentation

## 2012-08-05 NOTE — Evaluation (Signed)
Physical Therapy Evaluation  Patient Details  Name: Laura Atkins MRN: [id number] Date of Birth: Nov 23, 1940  Today's Date: 08/05/2012 Time: 0950-1035 PT Time Calculation (min): 45 min Charges: 1 eval Visit#: 1  of 8   Re-eval: 09/04/12 Assessment Diagnosis: gait disturbance  Next MD Visit: Dr. Krista Blue - 1 month  Prior Therapy: for R foot drop  Authorization: [facility] Medicare  Authorization Time Period:    Authorization Visit#: 1  of 10    Past Medical History:  Past Medical History  Diagnosis Date  . Hypertension   . Diabetes mellitus   . Hyperlipidemia   . Obesity   . Anemia   . Anxiety   . Depression   . GERD (gastroesophageal reflux disease)   . Pruritus   . Osteoarthritis   . Right shoulder pain   . Left knee pain   . Hypersomnia   . Urinary incontinence    Past Surgical History:  Past Surgical History  Procedure Date  . Abdominal hysterectomy   . Cyst removed left breast   . Cholecystectomy    Subjective Symptoms/Limitations Symptoms: Seizures, DM II Pertinent History: Pt is referered to PT secondary to gait disturbance.  She has recently had a NCV testing which caused her to have a bruise to her R thigh.  She is concerned about the brusing on her thigh secondary to her diabetes.  She has been able to venture out to [place] using her RW.  She reports that she cannot walk without her walker or has increased stumbling instances.  Pt reports that she is having increased fear of being home alone and being home at night, which is mostly due to seziurers and has to go with her children at night time.  Patient Stated Goals: "I want to be able to breath better and be able to walk better to lose weight."  Pain Assessment Currently in Pain?: Yes Pain Score:   5 Pain Location: Shoulder Pain Orientation: Right Pain Type: Acute pain  Precautions/Restrictions  Precautions Precaution Comments: Seizures - no electrical modalites  Prior Functioning  Home Living Lives  With: Family Prior Function Vocation: Retired Comments: She enjoys shopping  Cognition/Observation Observation/Other Assessments Observations: B LE hyporeflexia to quadriceps and achilles  Sensation/Coordination/Flexibility/Functional Tests Functional Tests Functional Tests: 30 sec STS: 0x complete Functional Tests: ABC:   Assessment RLE Strength RLE Overall Strength Comments: All strength taken in seated position Right Hip Flexion: 3/5 Right Hip Extension: 3-/5 Right Hip ABduction: 3+/5 Right Hip ADduction: 3/5 Right Knee Flexion: 4/5 Right Knee Extension: 4/5 Right Ankle Dorsiflexion: 1/5 Right Ankle Plantar Flexion: 3/5 LLE Strength LLE Overall Strength Comments: all strength taken in seated position Left Hip Flexion: 3/5 Left Hip Extension: 3-/5 Left Hip ABduction: 3+/5 Left Hip ADduction: 3/5 Left Knee Flexion: 5/5 Left Knee Extension: 5/5 Left Ankle Dorsiflexion: 3/5 Left Ankle Plantar Flexion: 3/5  Mobility/Balance  Ambulation/Gait Ambulation/Gait: Yes Ambulation/Gait Assistance: 4: Min guard Assistive device: Rolling walker Gait Pattern: Right steppage;Lateral hip instability;Decreased trunk rotation;Wide base of support (R foot drop) Static Standing Balance Single Leg Stance - Right Leg: 0  Single Leg Stance - Left Leg: 0  Tandem Stance - Right Leg: 0  Tandem Stance - Left Leg: 0  Rhomberg - Eyes Opened: 10  Rhomberg - Eyes Closed: 5  Berg Balance Test Sit to Stand: Able to stand using hands after several tries Standing Unsupported: Able to stand 30 seconds unsupported Sitting with Back Unsupported but Feet Supported on Floor or Stool: Able to sit  safely and securely 2 minutes Stand to Sit: Uses backs of legs against chair to control descent Transfers: Able to transfer with verbal cueing and /or supervision Standing Unsupported with Eyes Closed: Able to stand 3 seconds Standing Ubsupported with Feet Together: Needs help to attain position but able to  stand for 30 seconds with feet together From Standing, Reach Forward with Outstretched Arm: Can reach forward >5 cm safely (2") From Standing Position, Pick up Object from Floor: Able to pick up shoe, needs supervision From Standing Position, Turn to Look Behind Over each Shoulder: Needs supervision when turning Turn 360 Degrees: Needs assistance while turning Standing Unsupported, Alternately Place Feet on Step/Stool: Needs assistance to keep from falling or unable to try Standing Unsupported, One Foot in Front: Loses balance while stepping or standing Standing on One Leg: Unable to try or needs assist to prevent fall Total Score: 21  Timed Up and Go Test TUG: Normal TUG Normal TUG (seconds): 54  (w/RW)   Exercise/Treatments Supine Ab Set: 5 reps (10 sec holds) Bridge: 10 reps Other Supine Lumbar Exercises: Pelvic Floor Contractions 3x10 sec holds Prone  Other Prone Lumbar Exercises: multifidus strengthening 2x10 sec holds  Physical Therapy Assessment and Plan PT Assessment and Plan Clinical Impression Statement: Pt is referred to PT for gait disturbances secondary to long standing deconditioning from hx of uncontrolled DMII.  Recent NCV testing show overall decreased recrutiment patterns for RLE w/severe axonal length dependent periphreal neuropathy in addition to active neuropathic changes involving bilateral L4-5 leading her have have increased gait disturnances and balance disorders.  Pt will benefit from skilled therapeutic intervention in order to improve on the following deficits: Abnormal gait;Difficulty walking;Decreased activity tolerance;Impaired perceived functional ability;Pain;Decreased strength;Decreased mobility;Decreased balance;Obesity Rehab Potential: Good PT Frequency: Min 2X/week PT Duration: 4 weeks PT Treatment/Interventions: Gait training;Stair training;Functional mobility training;Therapeutic activities;Therapeutic exercise;Balance training;Neuromuscular  re-education;Patient/family education;Manual techniques PT Plan: NO ELECTRICAL MODALITIES SECONDARY TO HX OF SEIZURE.  Start: low leve balance activities( standing w/Wide BOS > Narrow BOS > eyes closed > staggard stance)  Continue with general LE strengthening to improve function and balance (squats, seated and standing heel and toe raises, 2in step), progress to complete activities to improve TUG time and Berg Score.    Goals Home Exercise Program Pt will Perform Home Exercise Program: Independently PT Goal: Perform Home Exercise Program - Progress: Goal set today PT Short Term Goals Time to Complete Short Term Goals: 2 weeks PT Short Term Goal 1: Pt will be able to demonstrate static standing with narrow BOS x1 minute.  PT Short Term Goal 2: Pt will improve TUG time to 45 sec w/RW PT Short Term Goal 3: Pt will improve B LE strength by 1 muscle grade PT Long Term Goals Time to Complete Long Term Goals: 4 weeks PT Long Term Goal 1: Pt will improve her Berg to 35 for improved static and dynamic balance.  PT Long Term Goal 2: Pt will improve her TUG time to less than 30 sec w/LRAD for improved community mobility.  Long Term Goal 3: Pt will improve LE activity tolerance and strength in order to ambulate for greater than 15 minutes to continue with weight loss of to help control her DMII.  Long Term Goal 4: Pt will improve her ABC to 40% for improved percieved functional balance.   Problem List Patient Active Problem List  Diagnosis  . IDDM  . HYPERLIPIDEMIA  . OBESITY  . ANEMIA  . DEPRESSION  . HYPERTENSION  . GERD  .  OSTEOARTHRITIS  . SHOULDER PAIN, RIGHT  . KNEE PAIN, LEFT  . NECK PAIN, CHRONIC  . BACK PAIN, CHRONIC  . DISORDER OF BONE AND CARTILAGE UNSPECIFIED  . HYPERSOMNIA, ASSOCIATED WITH SLEEP APNEA  . TREMOR  . URINARY INCONTINENCE  . UNSTEADY GAIT  . Headache  . Pelvic mass in female  . Dermatomycosis  . Bronchitis  . Paresthesia and pain of right extremity  .  Vomiting  . Dehydration  . Generalized weakness  . Orthostatic hypotension  . Altered mental status  . Acute respiratory failure with hypoxia  . Seizure  . Stress due to marital problems  . Stressful life event affecting family  . Difficulty in walking  . Weakness of both legs  . Balance disorder  . Rash and other nonspecific skin eruption  . Axillary adenitis  . Skin tag    PT Plan of Care PT Home Exercise Plan: see scanned report PT Patient Instructions: discussed importance of controlling DMII and importance of mobility to control mobility.  Consulted and Agree with Plan of Care: Patient  GP Functional Limitation: Mobility: Walking and moving around Current: CM Goal: CK  Fayette Gasner, PT 08/05/2012, 12:06 PM  Physician Documentation Your signature is required to indicate approval of the treatment plan as stated above.  Please sign and either send electronically or make a copy of this report for your files and return this physician signed original.   Please mark one 1.__approve of plan  2. ___approve of plan with the following conditions.   ______________________________                                                          _____________________ Physician Signature                                                                                                             Date

## 2012-08-12 ENCOUNTER — Ambulatory Visit (HOSPITAL_COMMUNITY): Payer: Self-pay | Admitting: Physical Therapy

## 2012-08-13 ENCOUNTER — Other Ambulatory Visit: Payer: Self-pay | Admitting: Family Medicine

## 2012-08-14 ENCOUNTER — Ambulatory Visit (HOSPITAL_COMMUNITY)
Admission: RE | Admit: 2012-08-14 | Discharge: 2012-08-14 | Disposition: A | Discharge status: Home / Self Care | Payer: Medicare Other | Source: Ambulatory Visit | Attending: Family Medicine | Admitting: Family Medicine

## 2012-08-14 NOTE — Progress Notes (Signed)
Physical Therapy Treatment Patient Details  Name: Laura Atkins MRN: [id number] Date of Birth: 09/29/1940  Today's Date: 08/14/2012 Time: 1520-1600 PT Time Calculation (min): 40 min  Visit#: 2  of 8   Re-eval: 09/04/12 Assessment Diagnosis: gait disturbance  Next MD Visit: Dr. Krista Blue - 1 month  Prior Therapy: for R foot drop Charge: NMR 23', therex 17'  Authorization: [employer] Medicare  Authorization Time Period:    Authorization Visit#: 2  of 10    Subjective: Symptoms/Limitations Symptoms: Pt c/o R knee pain 5/10. Pain Assessment Currently in Pain?: Yes Pain Score:   5 Pain Location: Knee Pain Orientation: Right  Precautions/Restrictions  Precautions Precaution Comments: Seizures - no electrical modalites  Exercise/Treatments Standing Heel Raises: 10 reps Forward Step Up: Both;10 reps;Hand Hold: 1;Step Height: 2" Functional Squat: 10 reps Seated Other Seated Knee Exercises: heel and toe raises 10x  manual faciltation for R TA    Balance Exercises Standing Standing Eyes Opened: Wide (BOA);Narrow base of support (BOS);Head turns;Solid surface;Time Standing Eyes Opened Time: Wide BOS x 1', narrow BOS x1', feet side by side x 1' with head turns. Standing Eyes Closed: Solid surface;Narrow base of support (BOS);Wide (BOA);3 reps;30 secs   Physical Therapy Assessment and Plan PT Assessment and Plan Clinical Impression Statement: Began low level balance training and funcitonal strengthening exercises.  Pt required min-mod assistance for LOB episodes and cueing for spatial awareness.  Manual facilitation required to TA for R DF.  Pt limited by fatigue and pain R knee. PT Plan: NO ELECTRICAL MODALITIES SECONDARY TO HX OF SEIZURE. Start: low leve balance activities( standing w/Wide BOS > Narrow BOS > eyes closed > staggard stance) Continue with general LE strengthening to improve function and balance (squats, seated and standing heel and toe raises, 2in step), progress to complete  activities to improve TUG time and Berg Score    Goals    Problem List Patient Active Problem List  Diagnosis  . IDDM  . HYPERLIPIDEMIA  . OBESITY  . ANEMIA  . DEPRESSION  . HYPERTENSION  . GERD  . OSTEOARTHRITIS  . SHOULDER PAIN, RIGHT  . KNEE PAIN, LEFT  . NECK PAIN, CHRONIC  . BACK PAIN, CHRONIC  . DISORDER OF BONE AND CARTILAGE UNSPECIFIED  . HYPERSOMNIA, ASSOCIATED WITH SLEEP APNEA  . TREMOR  . URINARY INCONTINENCE  . UNSTEADY GAIT  . Headache  . Pelvic mass in female  . Dermatomycosis  . Bronchitis  . Paresthesia and pain of right extremity  . Vomiting  . Dehydration  . Generalized weakness  . Orthostatic hypotension  . Altered mental status  . Acute respiratory failure with hypoxia  . Seizure  . Stress due to marital problems  . Stressful life event affecting family  . Difficulty in walking  . Weakness of both legs  . Balance disorder  . Rash and other nonspecific skin eruption  . Axillary adenitis  . Skin tag    PT - End of Session Equipment Utilized During Treatment: Gait belt Activity Tolerance: Patient tolerated treatment well;Patient limited by fatigue General Behavior During Session: [facility] for tasks performed Cognition: [facility] for tasks performed  GP    Aldona Lento 08/14/2012, 4:07 PM

## 2012-08-15 ENCOUNTER — Other Ambulatory Visit: Payer: Self-pay

## 2012-08-15 ENCOUNTER — Ambulatory Visit (HOSPITAL_COMMUNITY)
Admission: RE | Admit: 2012-08-15 | Discharge: 2012-08-15 | Disposition: A | Discharge status: Home / Self Care | Payer: Medicare Other | Source: Ambulatory Visit | Attending: Neurology | Admitting: Neurology

## 2012-08-15 ENCOUNTER — Telehealth: Payer: Self-pay | Admitting: Family Medicine

## 2012-08-15 MED ORDER — GABAPENTIN 300 MG PO CAPS: ORAL_CAPSULE | ORAL | Status: DC

## 2012-08-15 NOTE — Progress Notes (Signed)
Physical Therapy Treatment Patient Details  Name: Laura Atkins MRN: [id number] Date of Birth: Jul 04, 1941  Today's Date: 08/15/2012 Time: 0945-1025 PT Time Calculation (min): 40 min  Visit#: 3  of 8   Re-eval: 09/04/12 Charges: Gait x 8' Therex x 30'  Authorization: [place] Medicare  Authorization Visit#: 3  of 10    Subjective: Symptoms/Limitations Symptoms: "I would like to do something to warm-up before starting my exercises."  Pain Assessment Currently in Pain?: No/denies   Exercise/Treatments Aerobic Tread Mill: 8' @ .30 cycle LL 79cm to improve gait mechanics and activity tolerance. Standing Heel Raises: 10 reps Forward Step Up: 10 reps;Both;Step Height: 6" Functional Squat: 10 reps Rocker Board: 2 minutes Rocker Board Limitations: A/P; R/L   Physical Therapy Assessment and Plan PT Assessment and Plan Clinical Impression Statement: Began gait training on TM to improve gait mechanics and activity tolerance. Pt requires multimodal cueing for posture and vc's to improve heel/toe pattern and stride length. Pt also requires vc's to use less [place] assistance with standing activities. Pt reports 0/10 pain at end of session. PT Plan: NO ELECTRICAL MODALITIES SECONDARY TO HX OF SEIZURE. Start: low leve balance activities( standing w/Wide BOS > Narrow BOS > eyes closed > staggard stance) Continue with general LE strengthening to improve function and balance (squats, seated and standing heel and toe raises, 2in step), progress to complete activities to improve TUG time and Berg Score     Problem List Patient Active Problem List  Diagnosis  . IDDM  . HYPERLIPIDEMIA  . OBESITY  . ANEMIA  . DEPRESSION  . HYPERTENSION  . GERD  . OSTEOARTHRITIS  . SHOULDER PAIN, RIGHT  . KNEE PAIN, LEFT  . NECK PAIN, CHRONIC  . BACK PAIN, CHRONIC  . DISORDER OF BONE AND CARTILAGE UNSPECIFIED  . HYPERSOMNIA, ASSOCIATED WITH SLEEP APNEA  . TREMOR  . URINARY INCONTINENCE  . UNSTEADY GAIT  .  Headache  . Pelvic mass in female  . Dermatomycosis  . Bronchitis  . Paresthesia and pain of right extremity  . Vomiting  . Dehydration  . Generalized weakness  . Orthostatic hypotension  . Altered mental status  . Acute respiratory failure with hypoxia  . Seizure  . Stress due to marital problems  . Stressful life event affecting family  . Difficulty in walking  . Weakness of both legs  . Balance disorder  . Rash and other nonspecific skin eruption  . Axillary adenitis  . Skin tag    PT - End of Session Equipment Utilized During Treatment: Gait belt Activity Tolerance: Patient tolerated treatment well General Behavior During Session: [facility] for tasks performed Cognition: [facility] for tasks performed  Rachelle Hora, PTA 08/15/2012, 10:55 AM

## 2012-08-15 NOTE — Telephone Encounter (Signed)
Called to notify patient that she needs to have all diabetic meds refilled by Dr. Dorris Fetch.  No answer.  Will try again.

## 2012-08-18 ENCOUNTER — Ambulatory Visit (HOSPITAL_COMMUNITY)
Admission: RE | Admit: 2012-08-18 | Discharge: 2012-08-18 | Disposition: A | Discharge status: Home / Self Care | Payer: Medicare Other | Source: Ambulatory Visit | Attending: Neurology | Admitting: Neurology

## 2012-08-18 NOTE — Telephone Encounter (Signed)
Phone line busy.  Will try again.

## 2012-08-18 NOTE — Progress Notes (Signed)
Physical Therapy Treatment Patient Details  Name: Laura Atkins MRN: [id number] Date of Birth: December 24, 1940  Today's Date: 08/18/2012 Time: [id number] PT Time Calculation (min): 45 min Charges: 8' Gait, 30 TE Visit#: 4  of 8   Re-eval: 09/04/12    Authorization: [facility] Medicare  Authorization Time Period:    Authorization Visit#: 4  of 10    Subjective: Symptoms/Limitations Symptoms: Pt is 11 minutes late today due to difficulty with transportation.  She states that she doing ok and is doing a little bit for her exercises at home.  Pain Assessment Currently in Pain?: Yes Pain Score:   3 Pain Location: Leg Pain Orientation: Right  Precautions/Restrictions     Exercise/Treatments Aerobic Tread Mill: 8' @ .45 cycle LL 72.0 cm to improve gait mechanics and activity tolerance. Standing Functional Squat: 10 reps (0 HHA) Other Standing Knee Exercises: hip extensionx10 each Seated Long Arc Quad: Both;20 reps (3-5 sec holds) Other Seated Knee Exercises: heel and toe raises 20x  Other Seated Knee Exercises: hip isometric adduction 10x10 sec holds, hip abductuion blue band x20; B SLR x10    Physical Therapy Assessment and Plan PT Assessment and Plan Clinical Impression Statement: Treatment consisted of pt HEP to improve strength, balance and gait.  Pt requires mod cueing for proper technique and posture.  Provided pt with information on [place] [employer] to inquire about an AFO.  Updated HEP today.  PT Plan: NO ELECTRICAL MODALITIES SECONDARY TO HX OF SEIZURE.  Cont to improve LE strength and improve TUG and Berg score and improve activity tolerance w/gait    Goals Home Exercise Program Pt will Perform Home Exercise Program: Independently PT Short Term Goals Time to Complete Short Term Goals: 2 weeks PT Short Term Goal 1: Pt will be able to demonstrate static standing with narrow BOS x1 minute.  PT Short Term Goal 1 - Progress: Progressing toward goal PT Short Term Goal 2: Pt  will improve TUG time to 45 sec w/RW PT Short Term Goal 2 - Progress: Progressing toward goal PT Short Term Goal 3: Pt will improve B LE strength by 1 muscle grade PT Short Term Goal 3 - Progress: Progressing toward goal PT Long Term Goals Time to Complete Long Term Goals: 4 weeks PT Long Term Goal 1: Pt will improve her Berg to 35 for improved static and dynamic balance.  PT Long Term Goal 1 - Progress: Progressing toward goal PT Long Term Goal 2: Pt will improve her TUG time to less than 30 sec w/LRAD for improved community mobility.  PT Long Term Goal 2 - Progress: Progressing toward goal Long Term Goal 3: Pt will improve LE activity tolerance and strength in order to ambulate for greater than 15 minutes to continue with weight loss of to help control her DMII.  Long Term Goal 3 Progress: Progressing toward goal Long Term Goal 4: Pt will improve her ABC to 40% for improved percieved functional balance.  Long Term Goal 4 Progress: Progressing toward goal  Problem List Patient Active Problem List  Diagnosis  . IDDM  . HYPERLIPIDEMIA  . OBESITY  . ANEMIA  . DEPRESSION  . HYPERTENSION  . GERD  . OSTEOARTHRITIS  . SHOULDER PAIN, RIGHT  . KNEE PAIN, LEFT  . NECK PAIN, CHRONIC  . BACK PAIN, CHRONIC  . DISORDER OF BONE AND CARTILAGE UNSPECIFIED  . HYPERSOMNIA, ASSOCIATED WITH SLEEP APNEA  . TREMOR  . URINARY INCONTINENCE  . UNSTEADY GAIT  . Headache  . Pelvic mass  in female  . Dermatomycosis  . Bronchitis  . Paresthesia and pain of right extremity  . Vomiting  . Dehydration  . Generalized weakness  . Orthostatic hypotension  . Altered mental status  . Acute respiratory failure with hypoxia  . Seizure  . Stress due to marital problems  . Stressful life event affecting family  . Difficulty in walking  . Weakness of both legs  . Balance disorder  . Rash and other nonspecific skin eruption  . Axillary adenitis  . Skin tag    PT - End of Session Equipment Utilized  During Treatment: Gait belt Activity Tolerance: Patient tolerated treatment well General Behavior During Session: [facility] for tasks performed Cognition: [facility] for tasks performed  Rayaan Garguilo, PT 08/18/2012, 10:30 AM

## 2012-08-20 ENCOUNTER — Inpatient Hospital Stay (HOSPITAL_COMMUNITY): Admission: RE | Admit: 2012-08-20 | Payer: Self-pay | Source: Ambulatory Visit

## 2012-08-26 ENCOUNTER — Encounter (HOSPITAL_COMMUNITY): Payer: Self-pay | Admitting: Psychiatry

## 2012-08-26 ENCOUNTER — Ambulatory Visit (HOSPITAL_COMMUNITY)
Admission: RE | Admit: 2012-08-26 | Discharge: 2012-08-26 | Disposition: A | Discharge status: Home / Self Care | Payer: Medicare Other | Source: Ambulatory Visit | Attending: Neurology | Admitting: Neurology

## 2012-08-26 ENCOUNTER — Ambulatory Visit (INDEPENDENT_AMBULATORY_CARE_PROVIDER_SITE_OTHER): Payer: Medicare Other | Admitting: Psychiatry

## 2012-08-26 VITALS — BP 156/58 | HR 80 | Ht 66.75 in | Wt 268.4 lb

## 2012-08-26 DIAGNOSIS — IMO0001 Reserved for inherently not codable concepts without codable children: Secondary | ICD-10-CM | POA: Insufficient documentation

## 2012-08-26 DIAGNOSIS — F329 Major depressive disorder, single episode, unspecified: Secondary | ICD-10-CM

## 2012-08-26 DIAGNOSIS — R269 Unspecified abnormalities of gait and mobility: Secondary | ICD-10-CM | POA: Insufficient documentation

## 2012-08-26 DIAGNOSIS — M6281 Muscle weakness (generalized): Secondary | ICD-10-CM | POA: Insufficient documentation

## 2012-08-26 DIAGNOSIS — I10 Essential (primary) hypertension: Secondary | ICD-10-CM | POA: Insufficient documentation

## 2012-08-26 MED ORDER — SERTRALINE HCL 50 MG PO TABS: ORAL_TABLET | ORAL | Status: DC
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
## 2012-08-26 NOTE — Progress Notes (Signed)
Physical Therapy Treatment Patient Details  Name: Laura Atkins MRN: [id number] Date of Birth: 23-Jul-1941  Today's Date: 08/26/2012 Time: 0940-1030 PT Time Calculation (min): 50 min Visit#: 5  of 8   Re-eval: 09/04/12 Authorization: [facility] Medicare  Authorization Visit#: 5  of 10   Charges:  therex 38', gait 8'  Subjective: Symptoms/Limitations Symptoms: Pt. states her L knee hurts in one little spot on the lateral side rated 5/10.  No other complaints or difficulties.  Ambulated into dept. using a RW today. Pain Assessment Currently in Pain?: Yes Pain Location: Leg Pain Orientation: Left   Exercise/Treatments Aerobic Tread Mill: 7' total, 3' rest/4' rest@ .45 cycle LL 72.0 cm to improve gait mechanics and activity tolerance. Standing Heel Raises: 10 reps;Limitations Heel Raises Limitations: toeraises 10 reps Rocker Board: 2 minutes Rocker Board Limitations: A/P; R/L Other Standing Knee Exercises: Marching 10 reps Seated Long Arc Quad: 15 reps;Both;Weights Long Arc Quad Weight: 3 lbs. Other Seated Knee Exercises: heel and toe raises 20x     Physical Therapy Assessment and Plan PT Assessment and Plan Clinical Impression Statement: Pt. unable to complete full 8' on treadmill today, requiring a rest break due to fatigue.  Difficulty performing heel and toe raises in standing but able to complete with increased ROM in sitting.  Added alternating marching today without diffiuculty.   PT Plan: NO ELECTRICAL MODALITIES SECONDARY TO HX OF SEIZURE.  Cont to improve LE strength and improve TUG and Berg score and improve activity tolerance w/gait     Problem List Patient Active Problem List  Diagnosis  . IDDM  . HYPERLIPIDEMIA  . OBESITY  . ANEMIA  . DEPRESSION  . HYPERTENSION  . GERD  . OSTEOARTHRITIS  . SHOULDER PAIN, RIGHT  . KNEE PAIN, LEFT  . NECK PAIN, CHRONIC  . BACK PAIN, CHRONIC  . DISORDER OF BONE AND CARTILAGE UNSPECIFIED  . HYPERSOMNIA, ASSOCIATED WITH SLEEP  APNEA  . TREMOR  . URINARY INCONTINENCE  . UNSTEADY GAIT  . Headache  . Pelvic mass in female  . Dermatomycosis  . Bronchitis  . Paresthesia and pain of right extremity  . Vomiting  . Dehydration  . Generalized weakness  . Orthostatic hypotension  . Altered mental status  . Acute respiratory failure with hypoxia  . Seizure  . Stress due to marital problems  . Stressful life event affecting family  . Difficulty in walking  . Weakness of both legs  . Balance disorder  . Rash and other nonspecific skin eruption  . Axillary adenitis  . Skin tag    PT - End of Session Equipment Utilized During Treatment: Gait belt Activity Tolerance: Patient tolerated treatment well General Behavior During Session: [facility] for tasks performed Cognition: [facility] for tasks performed   Teena Irani, PTA/CLT 08/26/2012, 10:34 AM

## 2012-08-26 NOTE — Patient Instructions (Addendum)
Strongly consider attending at least 65 Alanon Meetings to help you learn about how your helping others to the exclusion of helping yourself is actually hurting yourself and is actually an addiction to fixing others and that you need to work the 12 Step to Happiness through the [employer]. Al-Anon Family Groups could be helpful with how to deal with substance abusing family and friends. Or your own issues of being in victim role.  There are only 40 Alanon Family Group meetings a week here in [place].  Online are current listing of those meetings @ [email or web address]  There are [employer].  Search on line and there you can learn the format and can access the schedule for yourself.  Their number is [phone]  The one in [place] is at the [facility] on [address]  The meeting is at 7 PM on Tuesdays.   "[employer] for [employer] by Arty Baumgartner could be very helpful.

## 2012-08-29 ENCOUNTER — Ambulatory Visit (HOSPITAL_COMMUNITY)
Admission: RE | Admit: 2012-08-29 | Discharge: 2012-08-29 | Disposition: A | Discharge status: Home / Self Care | Payer: Medicare Other | Source: Ambulatory Visit | Attending: Neurology | Admitting: Neurology

## 2012-08-29 NOTE — Progress Notes (Signed)
Physical Therapy Treatment Patient Details  Name: TEMPLE HAZELIP MRN: [id number] Date of Birth: September 18, 1941  Today's Date: 08/29/2012 Time: 0932-1017 PT Time Calculation (min): 45 min Charges: 80' NMR, 15' Self Care Visit#: 6  of 8   Re-eval: 09/04/12    Authorization: [facility] Medicare  Authorization Time Period:    Authorization Visit#: 6  of 10    Subjective: Symptoms/Limitations Symptoms: Pt reports that she is doing ok, has some soreness to her legs.  She plans on attending a movie this weekend at the local college.  Pain Assessment Currently in Pain?: Yes Pain Location: Knee Pain Orientation: Right;Left  Precautions/Restrictions     Exercise/Treatments Standing Gait Training: SPC x10' w/ instruction and improve LE strength x10' (88'), x 90 feet (65minutes); TUG w/RW: 50 sec, 43 sec, 39 sec, 37 sec; SPC: 50 sec, 57 sec, 41 sec   Physical Therapy Assessment and Plan PT Assessment and Plan Clinical Impression Statement: Pt has improved activitiy tolerance and able to tolerate standing for 15 minutes w/gait and balance activities.  Treatment consisted of improving gait speed. Able to improve TUG time w/RW to 37 sec and w/SPC to 40 sec w/motivation to improve speed.  PT Plan: Continue to encourage gait speed, improve LE strength and balance.  Add staggard tandem stance, cone rotations, STS when able.      Goals Home Exercise Program Pt will Perform Home Exercise Program: Independently PT Goal: Perform Home Exercise Program - Progress: Progressing toward goal PT Short Term Goals Time to Complete Short Term Goals: 2 weeks PT Short Term Goal 1: Pt will be able to demonstrate static standing with narrow BOS x1 minute.  PT Short Term Goal 1 - Progress: Met PT Short Term Goal 2: Pt will improve TUG time to 45 sec w/RW PT Short Term Goal 2 - Progress: Met PT Short Term Goal 3: Pt will improve B LE strength by 1 muscle grade PT Short Term Goal 3 - Progress: Met PT Long Term  Goals Time to Complete Long Term Goals: 4 weeks PT Long Term Goal 1: Pt will improve her Berg to 35 for improved static and dynamic balance.  PT Long Term Goal 1 - Progress: Progressing toward goal PT Long Term Goal 2: Pt will improve her TUG time to less than 30 sec w/LRAD for improved community mobility.  PT Long Term Goal 2 - Progress: Progressing toward goal Long Term Goal 3: Pt will improve LE activity tolerance and strength in order to ambulate for greater than 15 minutes to continue with weight loss of to help control her DMII.  Long Term Goal 3 Progress: Progressing toward goal Long Term Goal 4: Pt will improve her ABC to 40% for improved percieved functional balance.  Long Term Goal 4 Progress: Progressing toward goal  Problem List Patient Active Problem List  Diagnosis  . IDDM  . HYPERLIPIDEMIA  . OBESITY  . ANEMIA  . DEPRESSION  . HYPERTENSION  . GERD  . OSTEOARTHRITIS  . SHOULDER PAIN, RIGHT  . KNEE PAIN, LEFT  . NECK PAIN, CHRONIC  . BACK PAIN, CHRONIC  . DISORDER OF BONE AND CARTILAGE UNSPECIFIED  . HYPERSOMNIA, ASSOCIATED WITH SLEEP APNEA  . TREMOR  . URINARY INCONTINENCE  . UNSTEADY GAIT  . Headache  . Pelvic mass in female  . Dermatomycosis  . Bronchitis  . Paresthesia and pain of right extremity  . Vomiting  . Dehydration  . Generalized weakness  . Orthostatic hypotension  . Altered mental status  .  Acute respiratory failure with hypoxia  . Seizure  . Stress due to marital problems  . Stressful life event affecting family  . Difficulty in walking  . Weakness of both legs  . Balance disorder  . Rash and other nonspecific skin eruption  . Axillary adenitis  . Skin tag    PT - End of Session Equipment Utilized During Treatment: Gait belt Activity Tolerance: Patient tolerated treatment well General Behavior During Session: [facility] for tasks performed Cognition: [facility] for tasks performed PT Plan of Care PT Patient Instructions: During rest breaks  discussed importance of gait, increasing ambulation and posture for weight loss to help control DMII Consulted and Agree with Plan of Care: Patient  Lawerence Dery, PT 08/29/2012, 10:21 AM

## 2012-09-01 ENCOUNTER — Ambulatory Visit (INDEPENDENT_AMBULATORY_CARE_PROVIDER_SITE_OTHER): Payer: Medicare Other | Admitting: Psychiatry

## 2012-09-01 DIAGNOSIS — F329 Major depressive disorder, single episode, unspecified: Secondary | ICD-10-CM

## 2012-09-01 NOTE — Patient Instructions (Signed)
Discussed oraloly

## 2012-09-01 NOTE — Progress Notes (Addendum)
Patient:  Laura Atkins   DOB: April 25, 1941  MR Number: [id number]  Location: [place]:  [address] [place],  [place], [address]  Start: Monday 09/01/2012 11:10 AM End: Monday 09/01/2012 11:55 AM  Provider/Observer:     Maurice Small, MSW, LCSW   Chief Complaint:      Chief Complaint  Patient presents with  . Depression  . Anxiety    Reason For Service:     The patient was referred for services by primary care physician Dr. Tula Nakayama do to patient experiencing symptoms of depression. The patient has a long-standing history of depression. She was referred to this practice for continuity of care as she was seen at [facility] but was having difficulty scheduling a time to see a therapist. Her stressors included marital issues, declining health, family issues, and financial problems. Patient is seen for a follow up appointment today.  Interventions Strategy:  Supportive therapy, cognitive behavioral therapy  Participation Level:   Active  Participation Quality:  Appropriate      Behavioral Observation:  Casual, Alert, and Appropriate., talkative  Current Psychosocial Factors:   Content of Session:   Reviewing symptoms, processing feelings , reinforcing patient's efforts to respect boundaries.  Current Status:   The patient reports improved mood and decreased  worry.  Patient Progress:   Good. Patient states being less depressed as problems with her foot dropping have improved. She attends physical therapy regularly and has begun using a cane at times. Patient is pleased with her progress. However, she expresses frustration that she is experiencing increased  body aches and pains She reports enjoying Thanksgiving dinner with her family and is pleased that she did not make any negative comments regarding her son's relationship although he is involved with someone whom patient does not approve. She expresses increased acceptance of son being able to make his decisions.  Therapist reinforced patient's efforts to respect boundaries. Patient also has been more active and recently attended a play with her husband and  members from her church. She continues to worry about the possibility of bad things happening when home at night when husband is away although she isn't home alone. She admits watching several television news shows per day and listening to crime stories. Therapist works with patient to identify ways to limit her exposure to negative news.  Target Goals:   1.  Decrease anxiety and excessive worry.  Last Reviewed:   08/21/2010  Goals Addressed Today:    Decrease anxiety and excessive worry  Impression/Diagnosis:   The patient has been suffering symptoms of depression intermittently for the past 6-7 years. She continues to experience anxiety, excessive worrying, and depressed mood. Diagnosis: Depressive disorder NOS, anxiety disorder NOS  Diagnosis:  Axis I:  1. Depressive disorder             Axis II: Deferred

## 2012-09-12 ENCOUNTER — Other Ambulatory Visit: Payer: Self-pay | Admitting: Family Medicine

## 2012-09-12 ENCOUNTER — Telehealth: Payer: Self-pay | Admitting: Family Medicine

## 2012-09-12 NOTE — Telephone Encounter (Signed)
Pls let pt know in placce of vimovo she can take naproxen and omeprazole separatelty, , pls let me know after speaking with her if she still feels she needs the medication for arthritis pain I will send in

## 2012-09-18 ENCOUNTER — Other Ambulatory Visit: Payer: Self-pay | Admitting: Family Medicine

## 2012-09-19 NOTE — Telephone Encounter (Signed)
Patient states that she is taking aleve and it isn't working that well.  Would like to know what else she can take for arthritis.

## 2012-09-21 ENCOUNTER — Other Ambulatory Visit: Payer: Self-pay | Admitting: Family Medicine

## 2012-09-21 NOTE — Telephone Encounter (Signed)
dvise stop alleve, vimovo equivalent entered for once daily use only, naproxen and omeprazole pls send in after you speak with her

## 2012-09-22 ENCOUNTER — Other Ambulatory Visit: Payer: Self-pay

## 2012-09-22 MED ORDER — NAPROXEN 375 MG PO TABS: ORAL_TABLET | ORAL | Status: DC

## 2012-09-22 MED ORDER — OMEPRAZOLE 20 MG PO CPDR: 20.0000 mg | DELAYED_RELEASE_CAPSULE | Freq: Every day | ORAL | Status: DC

## 2012-09-22 NOTE — Telephone Encounter (Signed)
Pt aware.

## 2012-09-29 ENCOUNTER — Ambulatory Visit (INDEPENDENT_AMBULATORY_CARE_PROVIDER_SITE_OTHER): Payer: Medicare Other | Admitting: Psychiatry

## 2012-09-29 DIAGNOSIS — F329 Major depressive disorder, single episode, unspecified: Secondary | ICD-10-CM

## 2012-09-29 DIAGNOSIS — F3289 Other specified depressive episodes: Secondary | ICD-10-CM

## 2012-09-29 NOTE — Patient Instructions (Signed)
Discussed orally 

## 2012-09-29 NOTE — Progress Notes (Addendum)
Patient:  Laura Atkins   DOB: 1941/09/05  MR Number: [id number]  Location: [place]:  [address] [place],  [place], [address]  Start: Monday 09/29/2012 11:05 AM End: Monday 09/29/2012 11:50 AM  Provider/Observer:     Maurice Small, MSW, LCSW   Chief Complaint:      Chief Complaint  Patient presents with  . Anxiety    Reason For Service:     The patient was referred for services by primary care physician Dr. Tula Nakayama do to patient experiencing symptoms of depression. The patient has a long-standing history of depression. She was referred to this practice for continuity of care as she was seen at [facility] but was having difficulty scheduling a time to see a therapist. Her stressors included marital issues, declining health, family issues, and financial problems. Patient is seen for a follow up appointment today.  Interventions Strategy:  Supportive therapy, cognitive behavioral therapy  Participation Level:   Active  Participation Quality:  Appropriate      Behavioral Observation:  Casual, Alert, and Appropriate., talkative  Current Psychosocial Factors: Patient's grandson recently had legal issues.  Content of Session:   Reviewing symptoms, processing feelings , reinforcing patient's efforts to respect boundaries, discussing possible termination at next session  Current Status:   The patient reports improved mood and decreased  worry.  Patient Progress:   Good. Patient enjoyed holidays in spending time with her family. She also reports decreasing her television viewing of news stories and reports feeling better. She expresses appropriate concern regarding her 72 year old grandson who recently had legal issues. She expresses frustration with her grandson as well as his mother. Therapist and patient discuss boundary issues and review relaxation and coping techniques. Patient is pleased with her progress and states trying to become involved with new people in her  life. Therapist and patient also discussed the possibility of terminating case at next session.  Target Goals:   1.  Decrease anxiety and excessive worry.  Last Reviewed:   08/21/2010  Goals Addressed Today:    Decrease anxiety and excessive worry  Impression/Diagnosis:   The patient has been suffering symptoms of depression intermittently for the past 6-7 years. She continues to experience anxiety, excessive worrying, and depressed mood. Diagnosis: Depressive disorder NOS, anxiety disorder NOS  Diagnosis:  Axis I:  Depressive Disorder          Axis II: Deferred

## 2012-10-06 ENCOUNTER — Encounter (HOSPITAL_COMMUNITY): Payer: Self-pay | Admitting: Psychiatry

## 2012-10-06 ENCOUNTER — Ambulatory Visit (INDEPENDENT_AMBULATORY_CARE_PROVIDER_SITE_OTHER): Payer: Medicare Other | Admitting: Psychiatry

## 2012-10-06 VITALS — Wt 276.0 lb

## 2012-10-06 DIAGNOSIS — F329 Major depressive disorder, single episode, unspecified: Secondary | ICD-10-CM

## 2012-10-06 DIAGNOSIS — E669 Obesity, unspecified: Secondary | ICD-10-CM

## 2012-10-06 MED ORDER — SERTRALINE HCL 50 MG PO TABS: ORAL_TABLET | ORAL | Status: DC
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
## 2012-10-06 NOTE — Patient Instructions (Addendum)
B-100 complex MAY BE helpful for the drop foot.  It could be worth a try.  It turns your urine real yellow.  Is the capsule that the pharmacy gave you a GENERIC and is the generic AB RATED?  Ask primary care physician about using Inderal for bp control and control of tremors.

## 2012-10-22 ENCOUNTER — Ambulatory Visit: Payer: Medicare Other | Admitting: Family Medicine

## 2012-10-25 DEATH — deceased

## 2012-10-31 ENCOUNTER — Other Ambulatory Visit: Payer: Self-pay | Admitting: Family Medicine

## 2012-11-04 ENCOUNTER — Ambulatory Visit (INDEPENDENT_AMBULATORY_CARE_PROVIDER_SITE_OTHER): Payer: Medicare Other | Admitting: Family Medicine

## 2012-11-04 ENCOUNTER — Encounter: Payer: Self-pay | Admitting: Family Medicine

## 2012-11-04 VITALS — BP 124/70 | HR 89 | Resp 16 | Ht 67.5 in | Wt 278.0 lb

## 2012-11-04 DIAGNOSIS — H9190 Unspecified hearing loss, unspecified ear: Secondary | ICD-10-CM

## 2012-11-04 DIAGNOSIS — Z0000 Encounter for general adult medical examination without abnormal findings: Secondary | ICD-10-CM

## 2012-11-04 NOTE — Patient Instructions (Addendum)
F/u in 4.5 month  You will be referred for hearing test.  Please call the dermatologist you were already referred to regarding skin tag on the left nostril  No medication needed for left great toe at this time.  Please keep eye evaluation, your vision is not very good in the right eye  Please start regualr chair exercises to improve your health.  Please follow a diet low in fried and fatty foods, sweets and carbohydrates to improve your health and blood sugar

## 2012-11-04 NOTE — Assessment & Plan Note (Addendum)
Annual wellness completed as documented  Pt limited due to severe generalized osteoarthritis, she ambulates with assistance and is encouraged to keep as active as possible with emphasis on safety. Hearing is a perceived problem so she is referred for further evaluation. Blood sugar control has fallen back, she will continue to be treated  By endo, and is encouraged to be compliant with diet and mediation

## 2012-11-04 NOTE — Progress Notes (Signed)
Subjective:    Patient ID: Laura Atkins, female    DOB: 1941-03-25, 72 y.o.   MRN: [id number]  HPI  Preventive Screening-Counseling & Management   Patient present here today for a Medicare annual wellness visit.   Current Problems (verified)   Medications Prior to Visit Allergies (verified)   PAST HISTORY  Family History   Social History Married x 23 years, mother of 5 children.Disabled after 38 years , fell going into work No current tobacco, alcohol or street drug use   Risk Factors  Current exercise habits:  None, limitation due to severe osteoarthritis, encouraged to commit to chair exercises  Dietary issues discussed:low carb , reduced sweets and fatty foods   Cardiac risk factors: IDDM, had cardiac arrest during hospitalization in 2013  Depression Screen  (Note: if answer to either of the following is "Yes", a more complete depression screening is indicated)   Over the past two weeks, have you felt down, depressed or hopeless? Yes depressed Over the past two weeks, have you felt little interest or pleasure in doing things? No  Have you lost interest or pleasure in daily life? No  Do you often feel hopeless? Yes, sees psychiatrist  Do you cry easily over simple problems? No , but gets mad easily reportedly, states her husband is her biggest stress, she calls him a "monster"  Activities of Daily Living  In your present state of health, do you have any difficulty performing the following activities?  Driving?: yes  Managing money?: needs help at times Feeding yourself?:No Getting from bed to chair?:yes severe Arthritis Climbing a flight of stairs? Yes ambulates with walker or cane, has right foot drop Preparing food and eating?:No Bathing or showering?:No Getting dressed?:No Getting to the toilet?:No Using the toilet?:No Moving around from place to place?:yes needs assistance  Fall Risk Assessment In the past year have you fallen or had a near  fall?:No Are you currently taking any medications that make you dizziness?:No   Hearing Difficulties: No Do you often ask people to speak up or repeat themselves?:yes Do you experience ringing or noises in your ears?:No Do you have difficulty understanding soft or whispered voices?:yes  Cognitive Testing  Alert? Yes Normal Appearance?Yes  Oriented to person? Yes Place? Yes  Time? Yes  Displays appropriate judgment?Yes  Can read the correct time from a watch face? yes Are you having problems remembering things?No  Advanced Directives have been discussed with the patient?Yes , full code   List the Names of Other Physician/Practitioners you currently use: Dr. Dorris Fetch, dr Gilford Rile, [facility] Dr Gordy Clement, Dr Derald Macleod any recent Medical Services you may have received from other than [facility] providers in the past year (date may be approximate).   Assessment:    Annual Wellness Exam   Plan:    During the course of the visit the patient was educated and counseled about appropriate screening and preventive services including:  A healthy diet is rich in fruit, vegetables and whole grains. Poultry fish, nuts and beans are a healthy choice for protein rather then red meat. A low sodium diet and drinking 64 ounces of water daily is generally recommended. Oils and sweet should be limited. Carbohydrates especially for those who are diabetic or overweight, should be limited to 30-45 gram per meal. It is important to eat on a regular schedule, at least 3 times daily. Snacks should be primarily fruits, vegetables or nuts. It is important that you exercise regularly at least 30 minutes  5 times a week. If you develop chest pain, have severe difficulty breathing, or feel very tired, stop exercising immediately and seek medical attention  Immunization reviewed and updated. Cancer screening reviewed and updated    Patient Instructions (the written plan) was given to the patient.  Medicare  Attestation  I have personally reviewed:  The patient's medical and social history  Their use of alcohol, tobacco or illicit drugs  Their current medications and supplements  The patient's functional ability including ADLs,fall risks, home safety risks, cognitive, and hearing and visual impairment  Diet and physical activities  Evidence for depression or mood disorders  The patient's weight, height, BMI, and visual acuity have been recorded in the chart. I have made referrals, counseling, and provided education to the patient based on review of the above and I have provided the patient with a written personalized care plan for preventive services.     Review of Systems     Objective:   Physical Exam        Assessment & Plan:

## 2012-11-06 DIAGNOSIS — H9190 Unspecified hearing loss, unspecified ear: Secondary | ICD-10-CM | POA: Insufficient documentation

## 2012-11-10 ENCOUNTER — Telehealth: Payer: Self-pay | Admitting: Family Medicine

## 2012-11-10 ENCOUNTER — Ambulatory Visit (INDEPENDENT_AMBULATORY_CARE_PROVIDER_SITE_OTHER): Payer: Medicare Other | Admitting: Psychiatry

## 2012-11-10 DIAGNOSIS — F329 Major depressive disorder, single episode, unspecified: Secondary | ICD-10-CM

## 2012-11-10 DIAGNOSIS — F3289 Other specified depressive episodes: Secondary | ICD-10-CM

## 2012-11-10 NOTE — Telephone Encounter (Signed)
Will forward to pcp

## 2012-11-10 NOTE — Telephone Encounter (Signed)
I tried to call her back, no response. Pls let her know thyroid test was normal last year when checked. I suggest she discuss the tremor with her neurologist when she next sees them

## 2012-11-10 NOTE — Progress Notes (Signed)
Patient:  Laura Atkins   DOB: 1941/01/18  MR Number: [id number]  Location: [facility]:  [address] [place],  [place], [address]  Start: Monday 11/10/2012 10:10 AM End: Monday 11/10/2012 10:55 AM  Provider/Observer:     Maurice Small, MSW, LCSW   Chief Complaint:      Chief Complaint  Patient presents with  . Anxiety    Reason For Service:     The patient was referred for services by primary care physician Dr. Tula Nakayama do to patient experiencing symptoms of depression. The patient has a long-standing history of depression. She was referred to this practice for continuity of care as she was seen at [facility] but was having difficulty scheduling a time to see a therapist. Her stressors included marital issues, declining health, family issues, and financial problems. Patient is seen for a follow up appointment today.  Interventions Strategy:  Supportive therapy, cognitive behavioral therapy  Participation Level:   Active  Participation Quality:  Appropriate      Behavioral Observation:  Casual, Alert, and Appropriate., talkative  Current Psychosocial Factors:   Content of Session:   Reviewing symptoms, processing feelings , reinforcing patient's efforts to respect boundaries, discussing possible termination at next session  Current Status:   The patient reports improved mood but increased worry.  Patient Progress:   Fair. Patient states she has been "pretty good". She is relieved that her 39 -year- old grandson's legal issues have been resolved. Patient reports increased worry about her physical health as her right leg is weak. Patient expresses disappointment as she states she feels as though the strength leaving her leg. She also worries about her hands trembling. Therapist encourages patient to contact her primary care physician. Patient also plans to contact the medical supply company that will supply a brace for patient to use on her right leg. Patient  expresses fear of decreased physical functioning. Therapist works with patient to process her feelings and to identify coping statements.Target Goals:   1.  Decrease anxiety and excessive worry.  Last Reviewed:   08/21/2010  Goals Addressed Today:    Decrease anxiety and excessive worry  Impression/Diagnosis:   The patient has been suffering symptoms of depression intermittently for the past 6-7 years. She continues to experience anxiety, excessive worrying, and depressed mood. Diagnosis: Depressive disorder NOS, anxiety disorder NOS  Diagnosis:  Axis I:  Depressive Disorder          Axis II: Deferred

## 2012-11-10 NOTE — Patient Instructions (Addendum)
Discussed orally 

## 2012-11-12 NOTE — Telephone Encounter (Signed)
PTS HUSBAND AWARE

## 2012-11-13 ENCOUNTER — Telehealth: Payer: Self-pay | Admitting: Family Medicine

## 2012-11-14 NOTE — Telephone Encounter (Signed)
pls advise pt she needs to get this either from her podiatrist or doc treating her diabetes who is Dr Dorris Fetch

## 2012-11-14 NOTE — Telephone Encounter (Signed)
Previous message

## 2012-11-17 ENCOUNTER — Ambulatory Visit (HOSPITAL_COMMUNITY): Payer: Self-pay | Admitting: Psychiatry

## 2012-11-19 NOTE — Telephone Encounter (Signed)
Patient aware.

## 2012-11-20 ENCOUNTER — Ambulatory Visit (INDEPENDENT_AMBULATORY_CARE_PROVIDER_SITE_OTHER): Payer: Medicare Other | Admitting: Otolaryngology

## 2012-11-20 DIAGNOSIS — H903 Sensorineural hearing loss, bilateral: Secondary | ICD-10-CM

## 2012-11-24 ENCOUNTER — Telehealth: Payer: Self-pay | Admitting: Family Medicine

## 2012-11-25 NOTE — Telephone Encounter (Signed)
Patient is seen by endo

## 2012-12-04 ENCOUNTER — Other Ambulatory Visit: Payer: Self-pay

## 2012-12-04 ENCOUNTER — Telehealth: Payer: Self-pay | Admitting: Family Medicine

## 2012-12-04 MED ORDER — FUROSEMIDE 40 MG PO TABS: ORAL_TABLET | ORAL | Status: DC

## 2012-12-04 NOTE — Telephone Encounter (Signed)
Refill sent.

## 2012-12-09 ENCOUNTER — Ambulatory Visit (INDEPENDENT_AMBULATORY_CARE_PROVIDER_SITE_OTHER): Payer: Self-pay | Admitting: Ophthalmology

## 2012-12-24 ENCOUNTER — Ambulatory Visit (INDEPENDENT_AMBULATORY_CARE_PROVIDER_SITE_OTHER): Payer: Medicare Other | Admitting: Ophthalmology

## 2012-12-24 DIAGNOSIS — H2510 Age-related nuclear cataract, unspecified eye: Secondary | ICD-10-CM

## 2012-12-24 DIAGNOSIS — E1139 Type 2 diabetes mellitus with other diabetic ophthalmic complication: Secondary | ICD-10-CM

## 2012-12-24 DIAGNOSIS — H35039 Hypertensive retinopathy, unspecified eye: Secondary | ICD-10-CM

## 2012-12-24 DIAGNOSIS — I10 Essential (primary) hypertension: Secondary | ICD-10-CM

## 2012-12-24 DIAGNOSIS — H43819 Vitreous degeneration, unspecified eye: Secondary | ICD-10-CM

## 2012-12-24 DIAGNOSIS — E11319 Type 2 diabetes mellitus with unspecified diabetic retinopathy without macular edema: Secondary | ICD-10-CM

## 2012-12-31 ENCOUNTER — Encounter: Payer: Self-pay | Admitting: Family Medicine

## 2012-12-31 ENCOUNTER — Ambulatory Visit (INDEPENDENT_AMBULATORY_CARE_PROVIDER_SITE_OTHER): Payer: Medicare Other | Admitting: Family Medicine

## 2012-12-31 VITALS — BP 142/62 | HR 95 | Resp 16 | Ht 67.5 in | Wt 279.0 lb

## 2012-12-31 DIAGNOSIS — F329 Major depressive disorder, single episode, unspecified: Secondary | ICD-10-CM

## 2012-12-31 DIAGNOSIS — E109 Type 1 diabetes mellitus without complications: Secondary | ICD-10-CM

## 2012-12-31 DIAGNOSIS — I10 Essential (primary) hypertension: Secondary | ICD-10-CM

## 2012-12-31 DIAGNOSIS — E669 Obesity, unspecified: Secondary | ICD-10-CM

## 2012-12-31 DIAGNOSIS — M1990 Unspecified osteoarthritis, unspecified site: Secondary | ICD-10-CM

## 2012-12-31 DIAGNOSIS — R002 Palpitations: Secondary | ICD-10-CM

## 2012-12-31 DIAGNOSIS — Z1211 Encounter for screening for malignant neoplasm of colon: Secondary | ICD-10-CM

## 2012-12-31 DIAGNOSIS — K219 Gastro-esophageal reflux disease without esophagitis: Secondary | ICD-10-CM

## 2012-12-31 NOTE — Patient Instructions (Addendum)
Annual wellness in mid September, please call if you need me before.  We will locate your colonoscopy, to see if you need one.  You will be referred to cardiology to evaluate fatigue, leg swelling and intermittent palpitations  microalb today from office  I will get recetn lab from Dr Dorris Fetch, if kidney function is good , I will increase the lasix dose to help with leg swelling, will hold off on compression hose for now.  Pls sign for Dr West Pugh note, blood pressure is good today

## 2012-12-31 NOTE — Progress Notes (Signed)
  Subjective:    Patient ID: Laura Atkins, female    DOB: [birth year], 72 y.o.   MRN: [id number]  HPI The PT is here for follow up and re-evaluation of chronic medical conditions, medication management and review of any available recent lab and radiology data.  Preventive health is updated, specifically  Cancer screening and Immunization.   Questions or concerns regarding consultations or procedures which the PT has had in the interim are  Addressed.Still being followed by neurology as well as by endocrinology The PT denies any adverse reactions to current medications since the last visit.  C/o increased leg swelling , denies pND or orthopnea, depending on recent renal function , I will opt to increase lasix dose. C/o increased palpitation, fatigue, non specific chest discomfort in the past 1 to 2 month, has had no cardiology eval in the past 3 years despite multiple risk factors for CAD Blood sugars continue to fluctuate and control is not as good as it has been in the past     Review of Systems See HPI Denies recent fever or chills. Denies sinus pressure, nasal congestion, ear pain or sore throat. Denies chest congestion, productive cough or wheezing.  Denies abdominal pain, nausea, vomiting,diarrhea or constipation.   Denies dysuria, frequency, hesitancy or incontinence. Chronic  joint pain, swelling and limitation in mobility. Denies headaches, seizures, numbness, or tingling. Denies uncontrolled  depression, anxiety or insomnia. Denies skin break down or rash.        Objective:   Physical Exam  Patient alert and oriented and in no cardiopulmonary distress.  HEENT: No facial asymmetry, EOMI, no sinus tenderness,  oropharynx pink and moist.  Neck adequate ROM no adenopathy.  Chest: Clear to auscultation bilaterally.  CVS: S1, S2 no murmurs, no S3.  ABD: Soft non tender. Bowel sounds normal.  Ext: one plus pitting  Edema bilaterally  MS: decreased ROM spine,  shoulders, hips and knees.  Skin: Intact, no ulcerations or rash noted.  Psych: Good eye contact, normal affect. Memory impaired  not anxious or depressed appearing.  CNS: CN 2-12 intact, power,  normal throughout.       Assessment & Plan:

## 2013-01-01 ENCOUNTER — Telehealth: Payer: Self-pay | Admitting: Family Medicine

## 2013-01-01 NOTE — Telephone Encounter (Signed)
Pls let pt know I have her most recent colonoscopy, this was in 2002, so sheis past due, and I have referred her to Dr [name] , his office will call

## 2013-01-02 LAB — MICROALBUMIN / CREATININE URINE RATIO
Creatinine, Urine: 78.3 mg/dL
Microalb Creat Ratio: 23.4 mg/g (ref 0.0–30.0)
Microalb, Ur: 1.83 mg/dL (ref 0.00–1.89)

## 2013-01-03 NOTE — Assessment & Plan Note (Signed)
Controlled, no change in medication DASH diet and commitment to daily physical activity for a minimum of 30 minutes discussed and encouraged, as a part of hypertension management. The importance of attaining a healthy weight is also discussed.  

## 2013-01-03 NOTE — Assessment & Plan Note (Signed)
Unchanged. Patient re-educated about  the importance of commitment to a  minimum of 150 minutes of exercise per week. The importance of healthy food choices with portion control discussed. Encouraged to start a food diary, count calories and to consider  joining a support group. Sample diet sheets offered. Goals set by the patient for the next several months.    

## 2013-01-03 NOTE — Assessment & Plan Note (Signed)
Limitation in mobility, ambulates with assistance, no falls to date

## 2013-01-03 NOTE — Assessment & Plan Note (Signed)
C/o palpitations with fatigue and intermittent non specific chest discomfort. Due to multiple co morbidities which increase heart disease risk ad current symptoms, she is referred for further eval

## 2013-01-03 NOTE — Assessment & Plan Note (Signed)
Improved, now also seeing therapist which has helped alot

## 2013-01-03 NOTE — Assessment & Plan Note (Signed)
Controlled, no change in medication  

## 2013-01-03 NOTE — Assessment & Plan Note (Signed)
Uncontrolled, now followed by endo.

## 2013-01-05 ENCOUNTER — Ambulatory Visit (HOSPITAL_COMMUNITY): Payer: Self-pay | Admitting: Psychiatry

## 2013-01-05 ENCOUNTER — Encounter (INDEPENDENT_AMBULATORY_CARE_PROVIDER_SITE_OTHER): Payer: Self-pay | Admitting: *Deleted

## 2013-01-13 ENCOUNTER — Ambulatory Visit (INDEPENDENT_AMBULATORY_CARE_PROVIDER_SITE_OTHER): Payer: Medicare Other | Admitting: Family Medicine

## 2013-01-13 ENCOUNTER — Encounter: Payer: Self-pay | Admitting: Family Medicine

## 2013-01-13 VITALS — BP 136/72 | HR 76 | Resp 18 | Ht 67.5 in | Wt 278.1 lb

## 2013-01-13 DIAGNOSIS — N390 Urinary tract infection, site not specified: Secondary | ICD-10-CM

## 2013-01-13 DIAGNOSIS — R0789 Other chest pain: Secondary | ICD-10-CM

## 2013-01-13 DIAGNOSIS — N3000 Acute cystitis without hematuria: Secondary | ICD-10-CM

## 2013-01-13 DIAGNOSIS — R350 Frequency of micturition: Secondary | ICD-10-CM

## 2013-01-13 LAB — POCT URINALYSIS DIPSTICK
Bilirubin, UA: NEGATIVE
Glucose, UA: NEGATIVE
Ketones, UA: NEGATIVE
Nitrite, UA: POSITIVE
Protein, UA: 100
Spec Grav, UA: >=1.03
Urobilinogen, UA: 0.2
pH, UA: 5.5

## 2013-01-13 MED ORDER — CIPROFLOXACIN HCL 500 MG PO TABS: 500.0000 mg | ORAL_TABLET | Freq: Two times a day (BID) | ORAL | Status: AC

## 2013-01-13 NOTE — Telephone Encounter (Signed)
Pt coming in for appt today °

## 2013-01-13 NOTE — Patient Instructions (Addendum)
Take antibiotics as prescribed Keep appointment with heart doctor F/U with Dr. Moshe Cipro as previous

## 2013-01-16 ENCOUNTER — Ambulatory Visit (INDEPENDENT_AMBULATORY_CARE_PROVIDER_SITE_OTHER): Payer: Medicare Other | Admitting: Psychiatry

## 2013-01-16 ENCOUNTER — Encounter (HOSPITAL_COMMUNITY): Payer: Self-pay | Admitting: Psychiatry

## 2013-01-16 VITALS — BP 133/90 | Ht 67.0 in | Wt 279.0 lb

## 2013-01-16 DIAGNOSIS — F329 Major depressive disorder, single episode, unspecified: Secondary | ICD-10-CM

## 2013-01-16 DIAGNOSIS — M549 Dorsalgia, unspecified: Secondary | ICD-10-CM

## 2013-01-16 LAB — URINE CULTURE: Colony Count: >=100000

## 2013-01-16 MED ORDER — SERTRALINE HCL 50 MG PO TABS: ORAL_TABLET | ORAL | Status: DC

## 2013-01-16 NOTE — Patient Instructions (Addendum)
Meditation means getting in a quiet place physically, mentally, and emotionally so that your spiritual being can be open and willing to hear and feel your creator's directions and love.  "Native Wisdom for [employer] by Arty Baumgartner is a book that could be very helpful.  Some have gotten it on [employer].com for very low cost.  Strongly consider attending at least [place] to help you learn about how your helping others to the exclusion of helping yourself is actually hurting yourself and is actually an addiction to fixing others and that you need to work the 12 Step to Happiness through the [employer]. Al-Anon Family Groups could be helpful with how to deal with substance abusing family and friends. Or your own issues of being in victim role.  There are only 40 Alanon Family Group meetings a week here in [place].  Online are current listing of those meetings @ [place].org/html/meetings.html  There are [employer].  Search on line and there you can learn the format and can access the schedule for yourself.  Their number is [phone]  Take care of yourself.  No one else is standing up to do the job and only you know what you need.   GET SERIOUS about taking care of yourself.  Do the next right thing and that often means doing something to care for yourself along the lines of are you hungry, are you angry, are you lonely, are you tired, are you scared?  HALTS is what that stands for.  Call if problems or concerns.
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
## 2013-01-16 NOTE — Progress Notes (Signed)
  Subjective:    Patient ID: Laura Atkins, female    DOB: [birth year], 72 y.o.   MRN: [id number]  HPI  The patient complains of chest pain on and off for the past few weeks. This was evaluated by her PCP she is seeing cardiology next week. Pain occasionally associated with shortness of breath but is nonradiating mostly in the center of the chest she feels fatigued at times. She finds it very difficult to classify the chest pain. She also complains of dysuria with urinary frequency for the past couple of days  Review of Systems   GEN- denies fatigue, fever, weight loss,weakness, recent illness HEENT- denies eye drainage, change in vision, nasal discharge, CVS- +chest pain, palpitations RESP- denies SOB, cough, wheeze ABD- denies N/V, change in stools, abd pain GU- + dysuria, hematuria, dribbling, incontinence MSK- denies joint pain, muscle aches, injury Neuro- denies headache, dizziness, syncope, seizure activity      Objective:   Physical Exam   GEN- NAD, alert and oriented x3 HEENT- PERRL, EOMI, non injected sclera, pink conjunctiva, MMM, oropharynx clear Neck- Supple, no thryomegaly CVS- RRR, no murmur RESP-CTAB ABD-NABS,soft,NT,ND, no suprapubic pain, no cva tenderness EXT- No edema Pulses- Radial, DP- 2+  EKG- NSR, no ST changes      Assessment & Plan:

## 2013-01-18 DIAGNOSIS — N390 Urinary tract infection, site not specified: Secondary | ICD-10-CM | POA: Insufficient documentation

## 2013-01-18 DIAGNOSIS — R0789 Other chest pain: Secondary | ICD-10-CM | POA: Insufficient documentation

## 2013-01-18 HISTORY — DX: Urinary tract infection, site not specified: N39.0

## 2013-01-18 NOTE — Assessment & Plan Note (Signed)
Atypical chest pain, though has many risk factors, PCP has set up for cardiology next week EKG reassuring, ok to wait until next week

## 2013-01-18 NOTE — Assessment & Plan Note (Signed)
Antibiotics sent

## 2013-01-20 ENCOUNTER — Encounter: Payer: Self-pay | Admitting: Cardiology

## 2013-01-20 ENCOUNTER — Encounter: Payer: Self-pay | Admitting: *Deleted

## 2013-01-20 ENCOUNTER — Ambulatory Visit (INDEPENDENT_AMBULATORY_CARE_PROVIDER_SITE_OTHER): Payer: Medicare Other | Admitting: Cardiology

## 2013-01-20 VITALS — BP 150/72 | HR 80 | Ht 67.0 in | Wt 279.0 lb

## 2013-01-20 DIAGNOSIS — R32 Unspecified urinary incontinence: Secondary | ICD-10-CM

## 2013-01-20 DIAGNOSIS — E669 Obesity, unspecified: Secondary | ICD-10-CM

## 2013-01-20 DIAGNOSIS — E785 Hyperlipidemia, unspecified: Secondary | ICD-10-CM

## 2013-01-20 DIAGNOSIS — R079 Chest pain, unspecified: Secondary | ICD-10-CM

## 2013-01-20 DIAGNOSIS — I10 Essential (primary) hypertension: Secondary | ICD-10-CM

## 2013-01-20 DIAGNOSIS — M1990 Unspecified osteoarthritis, unspecified site: Secondary | ICD-10-CM

## 2013-01-20 DIAGNOSIS — R0789 Other chest pain: Secondary | ICD-10-CM

## 2013-01-20 DIAGNOSIS — F3289 Other specified depressive episodes: Secondary | ICD-10-CM

## 2013-01-20 DIAGNOSIS — R002 Palpitations: Secondary | ICD-10-CM

## 2013-01-20 DIAGNOSIS — F329 Major depressive disorder, single episode, unspecified: Secondary | ICD-10-CM

## 2013-01-20 DIAGNOSIS — D509 Iron deficiency anemia, unspecified: Secondary | ICD-10-CM

## 2013-01-20 NOTE — Assessment & Plan Note (Signed)
Reasonable control of hyperlipidemia when last assessed in 2012. Repeat testing will be performed.

## 2013-01-20 NOTE — Assessment & Plan Note (Signed)
Blood pressure control generally good with occasional systolic of [id number]. Current medication will be continued for now

## 2013-01-20 NOTE — Patient Instructions (Addendum)
Your physician recommends that you schedule a follow-up appointment in: [place] has requested that you have an echocardiogram. Echocardiography is a painless test that uses sound waves to create images of your heart. It provides your doctor with information about the size and shape of your heart and how well your heart's chambers and valves are working. This procedure takes approximately one hour. There are no restrictions for this procedure.  Your physician has requested that you have a lexiscan myoview. For further information please visit [email or web address]. Please follow instruction sheet, as given.  Your physician has recommended that you wear an event monitor. Event monitors are medical devices that record the heart's electrical activity. Doctors most often [place] these monitors to diagnose arrhythmias. Arrhythmias are problems with the speed or rhythm of the heartbeat. The monitor is a small, portable device. You can wear one while you do your normal daily activities. This is usually used to diagnose what is causing palpitations/syncope (passing out).TILL 02-10-13  YOUR PHYSICIAN RECOMMENDS THAT YOU START A LOW SODIUM DIET     1.5 Gram Low Sodium Diet A 1.5 gram sodium diet restricts the amount of sodium in the diet to no more than 1.5 g or 1500 mg daily. The American Heart Association recommends Americans over the age of 75 to consume no more than 1500 mg of sodium each day to reduce the risk of developing high blood pressure. Research also shows that limiting sodium may reduce heart attack and stroke risk. Many foods contain sodium for flavor and sometimes as a preservative. When the amount of sodium in a diet needs to be low, it is important to know what to look for when choosing foods and drinks. The following includes some information and guidelines to help make it easier for you to adapt to a low sodium diet. QUICK TIPS  Do not add salt to food.  Avoid convenience items  and fast food.  Choose unsalted snack foods.  Buy lower sodium products, often labeled as "lower sodium" or "no salt added."  Check food labels to learn how much sodium is in 1 serving.  When eating at a restaurant, ask that your food be prepared with less salt or none, if possible. READING FOOD LABELS FOR SODIUM INFORMATION The nutrition facts label is a good place to find how much sodium is in foods. Look for products with no more than 400 mg of sodium per serving. Remember that 1.5 g = 1500 mg. The food label may also list foods as:  Sodium-free: Less than 5 mg in a serving.  Very low sodium: 35 mg or less in a serving.  Low-sodium: 140 mg or less in a serving.  Light in sodium: 50% less sodium in a serving. For example, if a food that usually has 300 mg of sodium is changed to become light in sodium, it will have 150 mg of sodium.  Reduced sodium: 25% less sodium in a serving. For example, if a food that usually has 400 mg of sodium is changed to reduced sodium, it will have 300 mg of sodium. CHOOSING FOODS Grains  Avoid: Salted crackers and snack items. Some cereals, including instant hot cereals. Bread stuffing and biscuit mixes. Seasoned rice or pasta mixes.  Choose: Unsalted snack items. Low-sodium cereals, oats, puffed wheat and rice, shredded wheat. English muffins and bread. Pasta. Meats  Avoid: Salted, canned, smoked, spiced, pickled meats, including fish and poultry. Bacon, ham, sausage, cold cuts, hot dogs, anchovies.  Choose: Low-sodium  canned tuna and salmon. Fresh or frozen meat, poultry, and fish. Dairy  Avoid: Processed cheese and spreads. Cottage cheese. Buttermilk and condensed milk. Regular cheese.  Choose: Milk. Low-sodium cottage cheese. Yogurt. Sour cream. Low-sodium cheese. Fruits and Vegetables  Avoid: Regular canned vegetables. Regular canned tomato sauce and paste. Frozen vegetables in sauces. Olives. Laura Atkins. Relishes. Sauerkraut.  Choose:  Low-sodium canned vegetables. Low-sodium tomato sauce and paste. Frozen or fresh vegetables. Fresh and frozen fruit. Condiments  Avoid: Canned and packaged gravies. Worcestershire sauce. Tartar sauce. Barbecue sauce. Soy sauce. Steak sauce. Ketchup. Onion, garlic, and table salt. Meat flavorings and tenderizers.  Choose: Fresh and dried herbs and spices. Low-sodium varieties of mustard and ketchup. Lemon juice. Tabasco sauce. Horseradish. SAMPLE 1.5 GRAM SODIUM MEAL PLAN Breakfast / Sodium (mg)  1 cup low-fat milk / 143 mg  1 whole-wheat English muffin / 240 mg  1 tbs heart-healthy margarine / 153 mg  1 hard-boiled egg / 139 mg  1 small orange / 0 mg Lunch / Sodium (mg)  1 cup raw carrots / 76 mg  2 tbs no salt added peanut butter / 5 mg  2 slices whole-wheat bread / 270 mg  1 tbs jelly / 6 mg   cup red grapes / 2 mg Dinner / Sodium (mg)  1 cup whole-wheat pasta / 2 mg  1 cup low-sodium tomato sauce / 73 mg  3 oz lean ground beef / 57 mg  1 small side salad (1 cup raw spinach leaves,  cup cucumber,  cup yellow bell pepper) with 1 tsp olive oil and 1 tsp red wine vinegar / 25 mg Snack / Sodium (mg)  1 container low-fat vanilla yogurt / 107 mg  3 graham cracker squares / 127 mg Nutrient Analysis  Calories: 1745  Protein: 75 g  Carbohydrate: 237 g  Fat: 57 g  Sodium: 1425 mg Document Released: 09/10/2005 Document Revised: 12/03/2011 Document Reviewed: 12/12/2009 [facility] Patient Information 2013 [name]

## 2013-01-20 NOTE — Progress Notes (Signed)
Patient ID: Laura Atkins, female   DOB: 1941-08-23, 72 y.o.   MRN: [id number] HPI: Initial Cardiology evaluation for this nice woman referred by Fayrene Helper, MD for assessment of chest pain, palpitations and lower extremity edema. Patient has a complex medical history involving multiple problems summarized in the appropriate section below. She has had no known cardiac disease, has not previously been evaluated by a cardiologist and has undergone no significant cardiac testing. She describes rapid and forceful heartbeat when she is emotional that resolves with rest and removing herself from anxiety provoking situations. She has other palpitations that are less prominent and unpredictable. Episodes last no longer than 30 minutes. In recent weeks, she has noted sharp moderately severe mid substernal chest pain, radiating to the back at times. There has been no associated dyspnea or diaphoresis although she does have class II exertional dyspnea.  She has had pedal edema in recent months that has improved but not resolved with diuretic therapy.  Edema increases over the course of the day, and resolves overnight, when she typically notes nocturia x3 and incontinence of urine.  DJD of the knees limits her ability to exercise or even to walk more than 200 feet.  Current Outpatient Prescriptions on File Prior to Visit  Medication Sig Dispense Refill  . amLODipine (NORVASC) 10 MG tablet TAKE ONE TABLET BY MOUTH EVERY DAY  30 tablet  3  . aspirin 81 MG tablet Take 81 mg by mouth daily.        Marland Kitchen atorvastatin (LIPITOR) 40 MG tablet TAKE ONE TABLET BY MOUTH EVERY DAY  30 tablet  2  . B-D INS SYR ULTRAFINE 1CC/30G 30G X 1/2" 1 ML MISC USE AS DIRECTED  100 each  2  . benazepril (LOTENSIN) 20 MG tablet TAKE ONE TABLET BY MOUTH EVERY DAY  90 tablet  1  . ciprofloxacin (CIPRO) 500 MG tablet Take 1 tablet (500 mg total) by mouth 2 (two) times daily.  14 tablet  0  . clotrimazole-betamethasone (LOTRISONE) cream Apply  to affected area 2 times daily  45 g  1  . furosemide (LASIX) 40 MG tablet TAKE ONE TABLET BY MOUTH TWICE DAILY  45 tablet  2  . gabapentin (NEURONTIN) 300 MG capsule TAKE ONE CAPSULE BY MOUTH THREE TIMES DAILY  270 capsule  3  . glucose blood (ONE TOUCH ULTRA TEST) test strip Three times daily testing  DX 250.00  100 each  11  . insulin glargine (LANTUS) 100 UNIT/ML injection Inject 50 Units into the skin at bedtime.      . insulin lispro protamine-insulin lispro (HUMALOG 75/25) (75-25) 100 UNIT/ML SUSP Inject into the skin. Per sliding scale       . ketorolac (ACULAR) 0.4 % SOLN Take 1 drop by mouth 4 times daily.      . multivitamin (THERAGRAN) per tablet Take 1 tablet by mouth daily.        Marland Kitchen omeprazole (PRILOSEC) 20 MG capsule Take 1 capsule (20 mg total) by mouth daily.  30 capsule  3  . sertraline (ZOLOFT) 50 MG tablet Take 3 tab daily  90 tablet  2  . thiamine (VITAMIN B-1) 100 MG tablet Take 100 mg by mouth daily.      . vitamin B-12 (CYANOCOBALAMIN) 500 MCG tablet Take 500 mcg by mouth daily.         No current facility-administered medications on file prior to visit.   Allergies  Allergen Reactions  . Penicillins Shortness Of Breath,  Itching and Rash  . Prednisone Shortness Of Breath, Itching and Rash  . Propoxyphene-Acetaminophen Itching and Nausea And Vomiting    Past Medical History  Diagnosis Date  . Hypertension   . Diabetes mellitus   . Hyperlipidemia   . Obesity   . Anemia   . Anxiety and depression   . GERD (gastroesophageal reflux disease)   . Pruritus   . Osteoarthritis     Left knee; right shoulder; chronic neck and back pain  . Obstructive sleep apnea   . Urinary incontinence     Past Surgical History  Procedure Laterality Date  . Abdominal hysterectomy    . Breast excisional biopsy      Left; cyst  . Cholecystectomy      Family History  Problem Relation Age of Onset  . ADD / ADHD Grandchild   . Bipolar disorder Grandchild   . Bipolar disorder  Daughter   . Alcohol abuse Neg Hx   . Drug abuse Neg Hx     History   Social History  . Marital Status: Married    Spouse Name: N/A    Number of Children: N/A  . Years of Education: N/A   Occupational History  . Not on file.   Social History Main Topics  . Smoking status: Never Smoker   . Smokeless tobacco: Never Used  . Alcohol Use: No  . Drug Use: No  . Sexually Active: Yes    Birth Control/ Protection: Surgical   Other Topics Concern  . Not on file   Social History Narrative  . No narrative on file    ROS: Denies lightheadedness, orthopnea, PND or syncope. She experiences nocturnal diaphoresis, heartburn, constipation, recurrent UTIs, headaches, dizziness, insomnia, depression and anxiety.  All other systems reviewed and are negative.  PHYSICAL EXAM: BP 150/72  Pulse 80  Ht 5\' 7"  (1.702 m)  Wt 126.554 kg (279 lb)  BMI 43.69 kg/m2;  Body mass index is 43.69 kg/(m^2).   General-Well-developed; no acute distress Body Habitus-obese HEENT-[place]/AT; PERRL; EOM intact; conjunctiva and lids nl Neck-No JVD; no carotid bruits Endocrine-No thyromegaly Lungs-Clear lung fields; resonant percussion; normal I-to-E ratio Cardiovascular- normal PMI; normal S1 and S2 Abdomen-BS normal; soft and non-tender without masses or organomegaly Musculoskeletal-No deformities, cyanosis or clubbing Neurologic-Nl cranial nerves; symmetric strength and tone Skin- Warm, no significant lesions Extremities-Nl distal pulses; 1-2+ ankle and pretibial edema  EKG:  Normal sinus rhythm; J-point elevation; within normal limits. No previous tracing.  Jacqulyn Ducking, MD 01/20/2013  2:55 PM  ASSESSMENT AND PLAN

## 2013-01-20 NOTE — Progress Notes (Deleted)
Name: Laura Atkins    DOB: 14-May-1941  Age: 72 y.o.  MR#: [id number]       PCP:  Tula Nakayama, MD      Insurance: Payor: [employer] MEDICARE  Plan: [place]  Product Type: *No Product type*    CC:   No chief complaint on file. BOTTLES  VS Filed Vitals:   01/20/13 1345  BP: 150/72  Pulse: 80  Height: 5\' 7"  (1.702 m)  Weight: 279 lb (126.554 kg)    Weights Current Weight  01/20/13 279 lb (126.554 kg)  01/16/13 279 lb (126.554 kg)  01/13/13 278 lb 1.9 oz (126.154 kg)    Blood Pressure  BP Readings from Last 3 Encounters:  01/20/13 150/72  01/16/13 133/90  01/13/13 136/72     Admit date:  (Not on file) Last encounter with RMR:  Visit date not found   Allergy Penicillins; Prednisone; and Propoxyphene-acetaminophen  Current Outpatient Prescriptions  Medication Sig Dispense Refill  . acetaminophen (EXTRA STRENGTH PAIN RELIEF) 500 MG tablet Take 500 mg by mouth every 6 (six) hours as needed for pain.      Marland Kitchen amLODipine (NORVASC) 10 MG tablet TAKE ONE TABLET BY MOUTH EVERY DAY  30 tablet  3  . aspirin 81 MG tablet Take 81 mg by mouth daily.        Marland Kitchen atorvastatin (LIPITOR) 40 MG tablet TAKE ONE TABLET BY MOUTH EVERY DAY  30 tablet  2  . B-D INS SYR ULTRAFINE 1CC/30G 30G X 1/2" 1 ML MISC USE AS DIRECTED  100 each  2  . benazepril (LOTENSIN) 20 MG tablet TAKE ONE TABLET BY MOUTH EVERY DAY  90 tablet  1  . ciprofloxacin (CIPRO) 500 MG tablet Take 1 tablet (500 mg total) by mouth 2 (two) times daily.  14 tablet  0  . clotrimazole-betamethasone (LOTRISONE) cream Apply to affected area 2 times daily  45 g  1  . diphenhydrAMINE (BENADRYL) 25 MG tablet Take 25 mg by mouth every 6 (six) hours as needed for itching.      . furosemide (LASIX) 40 MG tablet TAKE ONE TABLET BY MOUTH TWICE DAILY  45 tablet  2  . gabapentin (NEURONTIN) 300 MG capsule TAKE ONE CAPSULE BY MOUTH THREE TIMES DAILY  270 capsule  3  . glucose blood (ONE TOUCH ULTRA TEST) test strip Three times  daily testing  DX 250.00  100 each  11  . insulin glargine (LANTUS) 100 UNIT/ML injection Inject 50 Units into the skin at bedtime.      . insulin lispro (HUMALOG) 100 UNIT/ML injection Inject into the skin 3 (three) times daily before meals. Sliding scale      . insulin lispro protamine-insulin lispro (HUMALOG 75/25) (75-25) 100 UNIT/ML SUSP Inject into the skin. Per sliding scale       . ketorolac (ACULAR) 0.4 % SOLN Take 1 drop by mouth 4 times daily.      . multivitamin (THERAGRAN) per tablet Take 1 tablet by mouth daily.        Marland Kitchen omeprazole (PRILOSEC) 20 MG capsule Take 1 capsule (20 mg total) by mouth daily.  30 capsule  3  . sertraline (ZOLOFT) 50 MG tablet Take 3 tab daily  90 tablet  2  . thiamine (VITAMIN B-1) 100 MG tablet Take 100 mg by mouth daily.      . vitamin B-12 (CYANOCOBALAMIN) 500 MCG tablet Take 500 mcg by mouth daily.  No current facility-administered medications for this visit.    Discontinued Meds:   There are no discontinued medications.  Patient Active Problem List   Diagnosis Date Noted  . Atypical chest pain 01/18/2013  . Palpitations 12/31/2012  . Hearing loss 11/06/2012  . Axillary adenitis 05/22/2012  . Seizure 01/25/2012  . Orthostatic hypotension 01/23/2012  . UNSTEADY GAIT 11/07/2010  . Headache 11/07/2010  . DISORDER OF BONE AND CARTILAGE UNSPECIFIED 05/16/2010  . TREMOR 05/16/2010  . ANEMIA 01/26/2010  . HYPERSOMNIA, ASSOCIATED WITH SLEEP APNEA 02/02/2009  . NECK PAIN, CHRONIC 10/21/2008  . IDDM 04/21/2008  . HYPERLIPIDEMIA 04/21/2008  . OBESITY 04/21/2008  . DEPRESSION 04/21/2008  . HYPERTENSION 04/21/2008  . GERD 04/21/2008  . OSTEOARTHRITIS 04/21/2008  . URINARY INCONTINENCE 04/21/2008    LABS    Component Value Date/Time   NA 139 02/28/2012 2259   NA 136 01/28/2012 0435   NA 134* 01/27/2012 0350   K 3.9 02/28/2012 2259   K 4.0 01/28/2012 0435   K 3.3* 01/27/2012 0350   CL 98 02/28/2012 2259   CL 101 01/28/2012 0435   CL 99 01/27/2012  0350   CO2 28 02/28/2012 2259   CO2 27 01/28/2012 0435   CO2 25 01/27/2012 0350   GLUCOSE 209* 02/28/2012 2259   GLUCOSE 152* 01/28/2012 0435   GLUCOSE 186* 01/27/2012 0350   BUN 27* 02/28/2012 2259   BUN 9 01/28/2012 0435   BUN 10 01/27/2012 0350   CREATININE 0.91 02/28/2012 2259   CREATININE 0.81 01/28/2012 0435   CREATININE 0.75 01/27/2012 0350   CREATININE 0.82 09/21/2011 1045   CREATININE 0.83 03/21/2011 1212   CALCIUM 10.3 02/28/2012 2259   CALCIUM 9.0 01/28/2012 0435   CALCIUM 8.6 01/27/2012 0350   GFRNONAA 63* 02/28/2012 2259   GFRNONAA 73* 01/28/2012 0435   GFRNONAA 85* 01/27/2012 0350   GFRAA 73* 02/28/2012 2259   GFRAA 85* 01/28/2012 0435   GFRAA >90 01/27/2012 0350   CMP     Component Value Date/Time   NA 139 02/28/2012 2259   K 3.9 02/28/2012 2259   CL 98 02/28/2012 2259   CO2 28 02/28/2012 2259   GLUCOSE 209* 02/28/2012 2259   BUN 27* 02/28/2012 2259   CREATININE 0.91 02/28/2012 2259   CREATININE 0.82 09/21/2011 1045   CALCIUM 10.3 02/28/2012 2259   PROT 6.4 01/26/2012 0450   ALBUMIN 3.2* 01/26/2012 0450   AST 30 01/26/2012 0450   ALT 16 01/26/2012 0450   ALKPHOS 92 01/26/2012 0450   BILITOT 0.5 01/26/2012 0450   GFRNONAA 63* 02/28/2012 2259   GFRAA 73* 02/28/2012 2259       Component Value Date/Time   WBC 7.1 02/28/2012 2259   WBC 7.1 01/28/2012 0435   WBC 9.1 01/27/2012 0350   HGB 10.7* 02/28/2012 2259   HGB 10.0* 01/28/2012 0435   HGB 9.9* 01/27/2012 0350   HCT 34.8* 02/28/2012 2259   HCT 31.2* 01/28/2012 0435   HCT 31.2* 01/27/2012 0350   MCV 69.2* 02/28/2012 2259   MCV 65.7* 01/28/2012 0435   MCV 65.8* 01/27/2012 0350    Lipid Panel     Component Value Date/Time   CHOL 172 09/21/2011 1045   TRIG 88 09/21/2011 1045   HDL 66 09/21/2011 1045   CHOLHDL 2.6 09/21/2011 1045   VLDL 18 09/21/2011 1045   LDLCALC 88 09/21/2011 1045    ABG No results found for this basename: phart, pco2, pco2art, po2, po2art, hco3, tco2, acidbasedef, o2sat     Lab Results  Component Value Date   TSH 0.520 01/23/2012   BNP (last 3  results)  Recent Labs  01/26/12 0450  PROBNP 1625.0*   Cardiac Panel (last 3 results) No results found for this basename: CKTOTAL, CKMB, TROPONINI, RELINDX,  in the last 72 hours  Iron/TIBC/Ferritin    Component Value Date/Time   IRON 54 02/18/2007 1119   TIBC 321 02/18/2007 1119   FERRITIN 167 02/18/2007 1119     EKG Orders placed in visit on 01/20/13  . EKG 12-LEAD     Prior Assessment and Plan Problem List as of 01/20/2013     ICD-9-CM   IDDM   Last Assessment & Plan   12/31/2012 Office Visit Written 01/03/2013  9:36 PM by Fayrene Helper, MD     Uncontrolled, now followed by endo.    HYPERLIPIDEMIA   Last Assessment & Plan   06/17/2012 Office Visit Written 07/01/2012  7:14 PM by Fayrene Helper, MD     Controlled, no change in medication Hyperlipidemia:Low fat diet discussed and encouraged.      OBESITY   Last Assessment & Plan   12/31/2012 Office Visit Written 01/03/2013  9:38 PM by Fayrene Helper, MD     Unchanged Patient re-educated about  the importance of commitment to a  minimum of 150 minutes of exercise per week. The importance of healthy food choices with portion control discussed. Encouraged to start a food diary, count calories and to consider  joining a support group. Sample diet sheets offered. Goals set by the patient for the next several months.       ANEMIA   DEPRESSION   Last Assessment & Plan   12/31/2012 Office Visit Written 01/03/2013  9:37 PM by Fayrene Helper, MD     Improved, now also seeing therapist which has helped [place]   12/31/2012 Office Visit Written 01/03/2013  9:34 PM by Fayrene Helper, MD     Controlled, no change in medication DASH diet and commitment to daily physical activity for a minimum of 30 minutes discussed and encouraged, as a part of hypertension management. The importance of attaining a healthy weight is also discussed.     GERD   Last Assessment & Plan   12/31/2012  Office Visit Written 01/03/2013  9:35 PM by Fayrene Helper, MD     Controlled, no change in medication     OSTEOARTHRITIS   Last Assessment & Plan   12/31/2012 Office Visit Written 01/03/2013  9:36 PM by Fayrene Helper, MD     Limitation in mobility, ambulates with assistance, no falls to date    NECK PAIN, CHRONIC   DISORDER OF BONE AND CARTILAGE UNSPECIFIED   HYPERSOMNIA, ASSOCIATED WITH SLEEP APNEA   TREMOR   URINARY INCONTINENCE   UNSTEADY GAIT   Headache   Orthostatic hypotension   Seizure   Last Assessment & Plan   02/04/2012 Office Visit Written 02/04/2012  6:46 PM by Fayrene Helper, MD     New witnessed seizure being followed by neurology, currently on kepra, no seizures since d/c     Axillary adenitis   Last Assessment & Plan   05/22/2012 Office Visit Written 05/26/2012  3:43 PM by Fayrene Helper, MD     Antibiotic prescribed due to symptoms    Hearing loss   Palpitations   Last Assessment & Plan   12/31/2012 Office Visit Written 01/03/2013  9:39 PM by Joycelyn Schmid  Bartholome Bill, MD     C/o palpitations with fatigue and intermittent non specific chest discomfort. Due to multiple co morbidities which increase heart disease risk ad current symptoms, she is referred for further eval    Atypical chest pain   Last Assessment & Plan   01/13/2013 Office Visit Written 01/18/2013  2:14 PM by Alycia Rossetti, MD     Atypical chest pain, though has many risk factors, PCP has set up for cardiology next week EKG reassuring, ok to wait until next week        Imaging: No results found.

## 2013-01-22 NOTE — Assessment & Plan Note (Signed)
Based upon history, palpitations sound as if they probably represent sinus tachycardia caused by release of epinephrine. For further evaluation, 3 weeks of event recording will be undertaken.

## 2013-01-22 NOTE — Assessment & Plan Note (Signed)
Current symptoms are not highly suggestive for myocardial ischemia, but considering patient's multiple risk factors, evaluation for possible CAD would be prudent. An echocardiogram and pharmacologic stress nuclear study will be obtained towards this end.

## 2013-01-23 ENCOUNTER — Ambulatory Visit (INDEPENDENT_AMBULATORY_CARE_PROVIDER_SITE_OTHER): Payer: Medicare Other | Admitting: Psychiatry

## 2013-01-23 DIAGNOSIS — F419 Anxiety disorder, unspecified: Secondary | ICD-10-CM

## 2013-01-23 DIAGNOSIS — F329 Major depressive disorder, single episode, unspecified: Secondary | ICD-10-CM

## 2013-01-23 NOTE — Progress Notes (Signed)
Patient:  Laura Atkins   DOB: 10/21/1940  MR Number: [id number]  Location: [place]:  [address] [place],  [place], [address]  Start: Friday 01/23/2013 11:10 AM End: Friday 01/23/2013 11:55 AM  Provider/Observer:     Maurice Small, MSW, LCSW   Chief Complaint:      Chief Complaint  Patient presents with  . Anxiety  . Depression    Reason For Service:     The patient was referred for services by primary care physician Dr. Tula Nakayama do to patient experiencing symptoms of depression. The patient has a long-standing history of depression. She was referred to this practice for continuity of care as she was seen at [facility] but was having difficulty scheduling a time to see a therapist. Her stressors included marital issues, declining health, family issues, and financial problems. Patient is seen for a follow up appointment today.  Interventions Strategy:  Supportive therapy, cognitive behavioral therapy  Participation Level:   Active  Participation Quality:  Appropriate      Behavioral Observation:  Casual, Alert, and Appropriate., talkative  Current Psychosocial Factors:   Content of Session:   Reviewing symptoms, processing feelings, discussing boundary issues and patient's relationship with her son and his girlfriend, identifying coping statements, reviewing relaxation technique  Current Status:   The patient reports depressed mood and increased anxiety along with excessive worry for the past several weeks.  Patient Progress:   Fair. Patient reports increased stress and worry due to to her son's involvement with his new girlfriend whom patient dislikes. Patient states that his girlfriend acts as  though she thinks she is better than patient and her family. Patient also expresses disapproval of the way the girlfriend dresses when she attends church at the church patient attends. She expresses disappointment about son's choices. Therapist works with patient to  discuss boundary issues in the relationship with her son as well as the limits of patient's responsibility and control. Therapist also works with patient to begin to explore thought patterns and to identify coping statements. Therapist works with patient to review and practice a diaphragmatic breathing. Patient reports making improvement regarding reducing time watching the news.    Last Reviewed:   08/21/2010  Goals Addressed Today:    Decrease anxiety and excessive worry  Impression/Diagnosis:   The patient has been suffering symptoms of depression intermittently for the past 6-7 years. She continues to experience anxiety, excessive worrying, and depressed mood. Diagnosis: Depressive disorder NOS, anxiety disorder NOS  Diagnosis:  Axis I:  Depressive Disorder          Axis II: Deferred

## 2013-01-23 NOTE — Patient Instructions (Signed)
Discussed orally 

## 2013-01-28 ENCOUNTER — Encounter: Payer: Self-pay | Admitting: Family Medicine

## 2013-02-06 ENCOUNTER — Ambulatory Visit (INDEPENDENT_AMBULATORY_CARE_PROVIDER_SITE_OTHER): Payer: Medicare Other | Admitting: Psychiatry

## 2013-02-06 DIAGNOSIS — F419 Anxiety disorder, unspecified: Secondary | ICD-10-CM

## 2013-02-06 DIAGNOSIS — F329 Major depressive disorder, single episode, unspecified: Secondary | ICD-10-CM
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
## 2013-02-08 NOTE — Patient Instructions (Signed)
Discussed orally 

## 2013-02-12 ENCOUNTER — Telehealth: Payer: Self-pay | Admitting: *Deleted

## 2013-02-12 ENCOUNTER — Other Ambulatory Visit: Payer: Self-pay | Admitting: *Deleted

## 2013-02-12 DIAGNOSIS — R002 Palpitations: Secondary | ICD-10-CM

## 2013-02-12 DIAGNOSIS — R079 Chest pain, unspecified: Secondary | ICD-10-CM

## 2013-02-12 DIAGNOSIS — I10 Essential (primary) hypertension: Secondary | ICD-10-CM

## 2013-02-12 NOTE — Telephone Encounter (Signed)
Report received, placed on desk, please advise

## 2013-02-12 NOTE — Telephone Encounter (Signed)
Pt is scared to have stress test done. She states she had this test done at [facility] 3-4 years ago and would like to know if Dr [name] was aware of that. (I will call for report). She also wants to know why she needs to have it done.

## 2013-02-13 ENCOUNTER — Encounter (HOSPITAL_COMMUNITY): Payer: Self-pay | Admitting: Psychiatry

## 2013-02-13 ENCOUNTER — Ambulatory Visit (INDEPENDENT_AMBULATORY_CARE_PROVIDER_SITE_OTHER): Payer: Medicare Other | Admitting: Psychiatry

## 2013-02-13 ENCOUNTER — Telehealth (HOSPITAL_COMMUNITY): Payer: Self-pay | Admitting: Psychiatry

## 2013-02-13 VITALS — BP 135/86 | Ht 68.5 in | Wt 282.2 lb

## 2013-02-13 DIAGNOSIS — F419 Anxiety disorder, unspecified: Secondary | ICD-10-CM

## 2013-02-13 DIAGNOSIS — F32A Depression, unspecified: Secondary | ICD-10-CM

## 2013-02-13 DIAGNOSIS — F329 Major depressive disorder, single episode, unspecified: Secondary | ICD-10-CM

## 2013-02-13 MED ORDER — SERTRALINE HCL 100 MG PO TABS: 200.0000 mg | ORAL_TABLET | Freq: Every day | ORAL | Status: DC

## 2013-02-13 NOTE — Telephone Encounter (Signed)
Per my office note: "considering patient's multiple risk factors, evaluation for possible CAD would be prudent. An echocardiogram and pharmacologic stress nuclear study will be obtained towards this end."  The fact that she had negative stress test 3 years ago does not mean that she could not have a positive test now. The decision as to whether she undergoes any testing is completely up to her. Dr. Moshe Cipro referred her to me expecting that the stress test would be performed.  Laura Ducking, MD

## 2013-02-13 NOTE — Patient Instructions (Addendum)
Consult with Dr Moshe Cipro about possibly some Voltaren gel for her knees  Shows your hand writing to the neurologist and ask again about something for the tremors.   Set a timer for 8 or a certain number minutes and walk for that amount of time in the house or in the yard.  Mark the number of minutes on a calendar for that day.  Do that every day this week.  Then next week increase the time by 1 minutes and then mark the calendar with the number of minutes for that day.  Each week increase your exercise by one minute.  Keep a record of this so you can see the progress you are making.  Do this every day, just like eating and sleeping.  It is good for pain control, depression, and for your soul/spirit.  Bring the record in for your next visit so we can talk about your effort and how you feel with the new exercise program going and working for you.  Relaxation is the ultimate solution for you.  You can seek it through tub baths, bubble baths, essential oils or incense, walking or chatting with friends, listening to soft music, watching a candle burn and just letting all thoughts go and appreciating the true essence of the Creator.  Pets or animals may be very helpful.  You might spend some time with them and then go do more directed meditation.  "I am Wishes Fulfilled Meditation" by Darla Lesches and Huey Romans may be helpful MUSIC for getting to sleep or for meditating You can order it from on line.  You might find the Chill channel on Pandora and explore the artists that you like better.   Take care of yourself.  No one else is standing up to do the job and only you know what you need.   GET SERIOUS about taking care of yourself.  Do the next right thing and that often means doing something to care for yourself along the lines of are you hungry, are you angry, are you lonely, are you tired, are you scared?  [employer] is what that stands for.  Call if problems or concerns

## 2013-02-13 NOTE — Telephone Encounter (Signed)
Resent script for Zoloft with more accurate instructions.
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
## 2013-02-13 NOTE — Telephone Encounter (Signed)
Called pt, no vm capability noted, will try call again

## 2013-02-17 ENCOUNTER — Telehealth: Payer: Self-pay | Admitting: *Deleted

## 2013-02-17 ENCOUNTER — Encounter: Payer: Self-pay | Admitting: Cardiology

## 2013-02-17 ENCOUNTER — Encounter (HOSPITAL_COMMUNITY): Payer: Self-pay

## 2013-02-17 ENCOUNTER — Encounter (HOSPITAL_COMMUNITY)
Admission: RE | Admit: 2013-02-17 | Discharge: 2013-02-17 | Disposition: A | Discharge status: Home / Self Care | Payer: Medicare Other | Source: Ambulatory Visit | Attending: Cardiology | Admitting: Cardiology

## 2013-02-17 ENCOUNTER — Ambulatory Visit (HOSPITAL_COMMUNITY)
Admission: RE | Admit: 2013-02-17 | Discharge: 2013-02-17 | Disposition: A | Discharge status: Home / Self Care | Payer: Medicare Other | Source: Ambulatory Visit | Attending: Cardiology | Admitting: Cardiology

## 2013-02-17 DIAGNOSIS — E119 Type 2 diabetes mellitus without complications: Secondary | ICD-10-CM | POA: Insufficient documentation

## 2013-02-17 DIAGNOSIS — R0789 Other chest pain: Secondary | ICD-10-CM

## 2013-02-17 DIAGNOSIS — R079 Chest pain, unspecified: Secondary | ICD-10-CM | POA: Insufficient documentation

## 2013-02-17 DIAGNOSIS — I10 Essential (primary) hypertension: Secondary | ICD-10-CM | POA: Insufficient documentation

## 2013-02-17 DIAGNOSIS — R002 Palpitations: Secondary | ICD-10-CM

## 2013-02-17 DIAGNOSIS — I517 Cardiomegaly: Secondary | ICD-10-CM

## 2013-02-17 MED ORDER — SODIUM CHLORIDE 0.9 % IJ SOLN
INTRAMUSCULAR | Status: AC
  Administered 2013-02-17: 10 mL via INTRAVENOUS
  Filled 2013-02-17: qty 10

## 2013-02-17 MED ORDER — TECHNETIUM TC 99M SESTAMIBI - CARDIOLITE
10.0000 | Freq: Once | INTRAVENOUS | Status: AC | PRN
  Administered 2013-02-17: 10 via INTRAVENOUS

## 2013-02-17 MED ORDER — TECHNETIUM TC 99M SESTAMIBI - CARDIOLITE
30.0000 | Freq: Once | INTRAVENOUS | Status: AC | PRN
  Administered 2013-02-17: 30 via INTRAVENOUS

## 2013-02-17 MED ORDER — REGADENOSON 0.4 MG/5ML IV SOLN
INTRAVENOUS | Status: AC
  Administered 2013-02-17: 0.4 mg via INTRAVENOUS
  Filled 2013-02-17: qty 5

## 2013-02-17 NOTE — Telephone Encounter (Signed)
PT IS CALLING FOR TEST RESULTS

## 2013-02-17 NOTE — Progress Notes (Signed)
Stress Lab Nurses Notes - [employer] 02/17/2013 Reason for doing test: Chest Pain & Palpitation Type of test: Wille Glaser Nurse performing test: Gerrit Halls, RN Nuclear Medicine Tech: Melburn Hake Echo Tech: Not Applicable MD performing test: R. [name] Family MD: Moshe Cipro  Test explained and consent signed: yes IV started: 22g jelco, Saline lock flushed, No redness or edema and Saline lock started in radiology Symptoms: Flushed Treatment/Intervention: None Reason test stopped: protocol completed After recovery IV was: Discontinued via X-ray tech and No redness or edema Patient to return to [place]. Med at : 12:45 Patient discharged: Home Patient's Condition upon discharge was: stable Comments: During test BP 138/53 & HR 90.  Symptoms resolved in recovery. Geanie Cooley T

## 2013-02-17 NOTE — Progress Notes (Signed)
*  PRELIMINARY RESULTS* Echocardiogram 2D Echocardiogram has been performed.  Tera Partridge 02/17/2013, 1:09 PM

## 2013-02-17 NOTE — Telephone Encounter (Signed)
While trying to contact pt, noted pt is currently listed as arrive to have her tests today 02-17-13 for myoview and echo, message had been left for pt prior concerning pt not wanting to have the test performed, see phone note 02-12-13

## 2013-02-17 NOTE — Telephone Encounter (Signed)
Unable to leave vm, noted pt is currently listed as arrive for [facility] and echo tests today 02-17-13

## 2013-02-18 ENCOUNTER — Encounter: Payer: Self-pay | Admitting: Cardiology

## 2013-02-18 NOTE — Telephone Encounter (Signed)
Pt advised that someone else must have called about the results per she knew that they would not be ready by this time, advised that no one else will be able to get her results per DOB and other information will have to be verified, pt understood and will come in for her apt tomorrow with Dr Jeri Lager on 02-19-13 to discuss results, pt understood

## 2013-02-18 NOTE — Telephone Encounter (Signed)
Advised manager YL concerning pt information was requested yesterday not by pt, will have pt sign a do not release form while in office tomorrow

## 2013-02-19 ENCOUNTER — Ambulatory Visit (INDEPENDENT_AMBULATORY_CARE_PROVIDER_SITE_OTHER): Payer: Medicare Other | Admitting: Cardiology

## 2013-02-19 ENCOUNTER — Encounter: Payer: Self-pay | Admitting: Cardiology

## 2013-02-19 VITALS — BP 129/70 | HR 81 | Ht 67.5 in | Wt 279.5 lb

## 2013-02-19 DIAGNOSIS — E785 Hyperlipidemia, unspecified: Secondary | ICD-10-CM

## 2013-02-19 DIAGNOSIS — E669 Obesity, unspecified: Secondary | ICD-10-CM

## 2013-02-19 DIAGNOSIS — K219 Gastro-esophageal reflux disease without esophagitis: Secondary | ICD-10-CM

## 2013-02-19 DIAGNOSIS — I10 Essential (primary) hypertension: Secondary | ICD-10-CM

## 2013-02-19 DIAGNOSIS — R0789 Other chest pain: Secondary | ICD-10-CM

## 2013-02-19 NOTE — Progress Notes (Signed)
Patient ID: Laura Atkins, female   DOB: June 24, 1941, 72 y.o.   MRN: [id number]  HPI: Schedule return visit for this pleasant woman with chest pain and palpitations. Since her last visit, she has done fairly well. She continues to note lower sternal chest discomfort with radiation to the right shoulder and neck. There is a slight pleuritic component. She experiences chest wall tenderness and increased symptoms with movement of the right arm. She has no associated dyspnea, diaphoresis nor nausea. Palpitations have not been particularly troublesome to her in recent weeks.  A stress nuclear study was negative for ischemia and verified normal left ventricular systolic function. Event recording identified no arrhythmias, even during multiple symptomatic spells.  Current Outpatient Prescriptions  Medication Sig Dispense Refill  . acetaminophen (EXTRA STRENGTH PAIN RELIEF) 500 MG tablet Take 500 mg by mouth every 6 (six) hours as needed for pain.      Marland Kitchen amLODipine (NORVASC) 10 MG tablet TAKE ONE TABLET BY MOUTH EVERY DAY  30 tablet  3  . aspirin 81 MG tablet Take 81 mg by mouth daily.        Marland Kitchen atorvastatin (LIPITOR) 40 MG tablet TAKE ONE TABLET BY MOUTH EVERY DAY  30 tablet  2  . B-D INS SYR ULTRAFINE 1CC/30G 30G X 1/2" 1 ML MISC USE AS DIRECTED  100 each  2  . benazepril (LOTENSIN) 20 MG tablet TAKE ONE TABLET BY MOUTH EVERY DAY  90 tablet  1  . clotrimazole-betamethasone (LOTRISONE) cream Apply to affected area 2 times daily  45 g  1  . diphenhydrAMINE (BENADRYL) 25 MG tablet Take 25 mg by mouth every 6 (six) hours as needed for itching.      . furosemide (LASIX) 40 MG tablet TAKE ONE TABLET BY MOUTH TWICE DAILY  45 tablet  2  . gabapentin (NEURONTIN) 300 MG capsule TAKE ONE CAPSULE BY MOUTH THREE TIMES DAILY  270 capsule  3  . glucose blood (ONE TOUCH ULTRA TEST) test strip Three times daily testing  DX 250.00  100 each  11  . insulin glargine (LANTUS) 100 UNIT/ML injection Inject 50 Units into the  skin at bedtime.      . insulin lispro (HUMALOG) 100 UNIT/ML injection Inject into the skin 3 (three) times daily before meals. Sliding scale      . insulin lispro protamine-insulin lispro (HUMALOG 75/25) (75-25) 100 UNIT/ML SUSP Inject into the skin. Per sliding scale       . ketorolac (ACULAR) 0.4 % SOLN Take 1 drop by mouth 4 times daily.      . multivitamin (THERAGRAN) per tablet Take 1 tablet by mouth daily.        Marland Kitchen omeprazole (PRILOSEC) 20 MG capsule Take 1 capsule (20 mg total) by mouth daily.  30 capsule  3  . sertraline (ZOLOFT) 100 MG tablet Take 50 mg by mouth 3 (three) times daily. PT TAKES TWO 50MG  TABLETS IN THE AM, ONE 50MG  TABLET MID DAY AND ONE 50MG  TABLET QHS      . thiamine (VITAMIN B-1) 100 MG tablet Take 100 mg by mouth daily.      . vitamin B-12 (CYANOCOBALAMIN) 500 MCG tablet Take 500 mcg by mouth daily.         No current facility-administered medications for this visit.   Allergies  Allergen Reactions  . Penicillins Shortness Of Breath, Itching and Rash  . Prednisone Shortness Of Breath, Itching and Rash  . Propoxyphene-Acetaminophen Itching and Nausea And Vomiting  Past medical history, social history, and family history reviewed and updated.  PHYSICAL EXAM: BP 129/70  Pulse 81  Ht 5' 7.5" (1.715 m)  Wt 126.78 kg (279 lb 8 oz)  BMI 43.1 kg/m2;  Body mass index is 43.1 kg/(m^2). General-Well developed; no acute distress Body habitus-markedly overweight Neck-No JVD; no carotid bruits Lungs-clear lung fields; resonant to percussion; decreased breath sounds at the bases Cardiovascular-normal PMI; normal S1 and S2 Abdomen-normal bowel sounds; soft and non-tender without masses or organomegaly Musculoskeletal-No deformities, no cyanosis or clubbing Neurologic-Normal cranial nerves; symmetric strength and tone Skin-Warm, no significant lesions Extremities-distal pulses intact; trace ankle edema  Jacqulyn Ducking, MD 02/19/2013  1:04 PM  ASSESSMENT AND  PLAN

## 2013-02-19 NOTE — Telephone Encounter (Signed)
Pt in office today to be evaluated by Dr Jeri Lager and discuss test results, pt form completed for DPR at front desk with [facility], YL made aware

## 2013-02-19 NOTE — Assessment & Plan Note (Signed)
Chest discomfort does not have the characteristics of GI origin, and patient is already treated with a PPI. I doubt that change to a different agent, an increase in dose or further GI evaluation will be beneficial in terms of current symptoms.

## 2013-02-19 NOTE — Assessment & Plan Note (Signed)
Blood pressure control has been good with an occasional systolic measurement and [id number] range. Current antihypertensive therapy appears adequate.

## 2013-02-19 NOTE — Assessment & Plan Note (Signed)
Hyperlipidemia was adequately controlled when assessed in 2012. There is no specific goal value in the absence of known vascular disease. No modification of current therapy is recommended.

## 2013-02-19 NOTE — Assessment & Plan Note (Signed)
Characteristics of chest discomfort suggests a musculoskeletal etiology. Patient has used Tylenol with benefit. She is advised to continue to do so and to consider local therapy such as moist heat. I reassured her that there is no evidence chest discomfort reflects underlying cardiac disease.

## 2013-02-19 NOTE — Telephone Encounter (Signed)
Pt in office today to be evaluated by Dr Jeri Lager and discuss test results, pt form completed for DPR at front desk with [facility], YL made aware

## 2013-02-19 NOTE — Assessment & Plan Note (Addendum)
Patient has a history of prior polysomnography, which was reported to the patient as having been an inadequate study. Nonetheless, she has been prescribed nocturnal positive pressure ventilation, but has not been able to tolerate the mask. If necessary, sleep study should be repeated and referral to a sleep specialist arranged. Patient was seen on one occasion by Dr. Merlene Laughter, but does not wish to return to his care.

## 2013-02-19 NOTE — Progress Notes (Deleted)
Name: Laura Atkins    DOB: [birth year]  Age: 72 y.o.  MR#: [id number]       PCP:  Tula Nakayama, MD      Insurance: Payor: Onnie Boer MEDICARE / Plan: AARP MEDICARE COMPLETE / Product Type: *No Product type* /   CC:   No chief complaint on file.  NO LIST, ADVISED INCREASE IN ZOLOFT, CHANGED IN CHART VS Filed Vitals:   02/19/13 1150  BP: 129/70  Pulse: 81  Height: 5' 7.5" (1.715 m)  Weight: 279 lb 8 oz (126.78 kg)    Weights Current Weight  02/19/13 279 lb 8 oz (126.78 kg)  02/13/13 282 lb 3.2 oz (128.005 kg)  01/20/13 279 lb (126.554 kg)    Blood Pressure  BP Readings from Last 3 Encounters:  02/19/13 129/70  02/13/13 135/86  01/20/13 150/72     Admit date:  (Not on file) Last encounter with RMR:  01/20/2013   Allergy Penicillins; Prednisone; and Propoxyphene-acetaminophen  Current Outpatient Prescriptions  Medication Sig Dispense Refill  . acetaminophen (EXTRA STRENGTH PAIN RELIEF) 500 MG tablet Take 500 mg by mouth every 6 (six) hours as needed for pain.      Marland Kitchen amLODipine (NORVASC) 10 MG tablet TAKE ONE TABLET BY MOUTH EVERY DAY  30 tablet  3  . aspirin 81 MG tablet Take 81 mg by mouth daily.        Marland Kitchen atorvastatin (LIPITOR) 40 MG tablet TAKE ONE TABLET BY MOUTH EVERY DAY  30 tablet  2  . B-D INS SYR ULTRAFINE 1CC/30G 30G X 1/2" 1 ML MISC USE AS DIRECTED  100 each  2  . benazepril (LOTENSIN) 20 MG tablet TAKE ONE TABLET BY MOUTH EVERY DAY  90 tablet  1  . clotrimazole-betamethasone (LOTRISONE) cream Apply to affected area 2 times daily  45 g  1  . diphenhydrAMINE (BENADRYL) 25 MG tablet Take 25 mg by mouth every 6 (six) hours as needed for itching.      . furosemide (LASIX) 40 MG tablet TAKE ONE TABLET BY MOUTH TWICE DAILY  45 tablet  2  . gabapentin (NEURONTIN) 300 MG capsule TAKE ONE CAPSULE BY MOUTH THREE TIMES DAILY  270 capsule  3  . glucose blood (ONE TOUCH ULTRA TEST) test strip Three times daily testing  DX 250.00  100 each  11  . insulin glargine  (LANTUS) 100 UNIT/ML injection Inject 50 Units into the skin at bedtime.      . insulin lispro (HUMALOG) 100 UNIT/ML injection Inject into the skin 3 (three) times daily before meals. Sliding scale      . insulin lispro protamine-insulin lispro (HUMALOG 75/25) (75-25) 100 UNIT/ML SUSP Inject into the skin. Per sliding scale       . ketorolac (ACULAR) 0.4 % SOLN Take 1 drop by mouth 4 times daily.      . multivitamin (THERAGRAN) per tablet Take 1 tablet by mouth daily.        Marland Kitchen omeprazole (PRILOSEC) 20 MG capsule Take 1 capsule (20 mg total) by mouth daily.  30 capsule  3  . sertraline (ZOLOFT) 100 MG tablet Take 50 mg by mouth 3 (three) times daily. PT TAKES TWO 50MG  TABLETS IN THE AM, ONE 50MG  TABLET MID DAY AND ONE 50MG  TABLET QHS      . thiamine (VITAMIN B-1) 100 MG tablet Take 100 mg by mouth daily.      . vitamin B-12 (CYANOCOBALAMIN) 500 MCG tablet Take 500 mcg by mouth daily.  No current facility-administered medications for this visit.    Discontinued Meds:    Medications Discontinued During This Encounter  Medication Reason  . sertraline (ZOLOFT) 100 MG tablet     Patient Active Problem List   Diagnosis Date Noted  . Atypical chest pain 01/18/2013  . Palpitations 12/31/2012  . Hearing loss 11/06/2012  . Seizure 01/25/2012  . Orthostatic hypotension 01/23/2012  . UNSTEADY GAIT 11/07/2010  . Headache 11/07/2010  . TREMOR 05/16/2010  . Microcytic anemia 01/26/2010  . NECK PAIN, CHRONIC 10/21/2008  . IDDM 04/21/2008  . HYPERLIPIDEMIA 04/21/2008  . OBESITY 04/21/2008  . Anxiety and depression 04/21/2008  . HYPERTENSION 04/21/2008  . GERD 04/21/2008  . OSTEOARTHRITIS 04/21/2008  . URINARY INCONTINENCE 04/21/2008    LABS    Component Value Date/Time   NA 139 02/28/2012 2259   NA 136 01/28/2012 0435   NA 134* 01/27/2012 0350   K 3.9 02/28/2012 2259   K 4.0 01/28/2012 0435   K 3.3* 01/27/2012 0350   CL 98 02/28/2012 2259   CL 101 01/28/2012 0435   CL 99 01/27/2012 0350    CO2 28 02/28/2012 2259   CO2 27 01/28/2012 0435   CO2 25 01/27/2012 0350   GLUCOSE 209* 02/28/2012 2259   GLUCOSE 152* 01/28/2012 0435   GLUCOSE 186* 01/27/2012 0350   BUN 27* 02/28/2012 2259   BUN 9 01/28/2012 0435   BUN 10 01/27/2012 0350   CREATININE 0.91 02/28/2012 2259   CREATININE 0.81 01/28/2012 0435   CREATININE 0.75 01/27/2012 0350   CREATININE 0.82 09/21/2011 1045   CREATININE 0.83 03/21/2011 1212   CALCIUM 10.3 02/28/2012 2259   CALCIUM 9.0 01/28/2012 0435   CALCIUM 8.6 01/27/2012 0350   GFRNONAA 63* 02/28/2012 2259   GFRNONAA 73* 01/28/2012 0435   GFRNONAA 85* 01/27/2012 0350   GFRAA 73* 02/28/2012 2259   GFRAA 85* 01/28/2012 0435   GFRAA >90 01/27/2012 0350   CMP     Component Value Date/Time   NA 139 02/28/2012 2259   K 3.9 02/28/2012 2259   CL 98 02/28/2012 2259   CO2 28 02/28/2012 2259   GLUCOSE 209* 02/28/2012 2259   BUN 27* 02/28/2012 2259   CREATININE 0.91 02/28/2012 2259   CREATININE 0.82 09/21/2011 1045   CALCIUM 10.3 02/28/2012 2259   PROT 6.4 01/26/2012 0450   ALBUMIN 3.2* 01/26/2012 0450   AST 30 01/26/2012 0450   ALT 16 01/26/2012 0450   ALKPHOS 92 01/26/2012 0450   BILITOT 0.5 01/26/2012 0450   GFRNONAA 63* 02/28/2012 2259   GFRAA 73* 02/28/2012 2259       Component Value Date/Time   WBC 7.1 02/28/2012 2259   WBC 7.1 01/28/2012 0435   WBC 9.1 01/27/2012 0350   HGB 10.7* 02/28/2012 2259   HGB 10.0* 01/28/2012 0435   HGB 9.9* 01/27/2012 0350   HCT 34.8* 02/28/2012 2259   HCT 31.2* 01/28/2012 0435   HCT 31.2* 01/27/2012 0350   MCV 69.2* 02/28/2012 2259   MCV 65.7* 01/28/2012 0435   MCV 65.8* 01/27/2012 0350    Lipid Panel     Component Value Date/Time   CHOL 172 09/21/2011 1045   TRIG 88 09/21/2011 1045   HDL 66 09/21/2011 1045   CHOLHDL 2.6 09/21/2011 1045   VLDL 18 09/21/2011 1045   LDLCALC 88 09/21/2011 1045    ABG No results found for this basename: phart, pco2, pco2art, po2, po2art, hco3, tco2, acidbasedef, o2sat     Lab Results  Component Value Date  TSH 0.520 01/23/2012   BNP (last 3 results) No results  found for this basename: PROBNP,  in the last 8760 hours Cardiac Panel (last 3 results) No results found for this basename: CKTOTAL, CKMB, TROPONINI, RELINDX,  in the last 72 hours  Iron/TIBC/Ferritin    Component Value Date/Time   IRON 54 02/18/2007 1119   TIBC 321 02/18/2007 1119   FERRITIN 167 02/18/2007 1119     EKG Orders placed in visit on 02/12/13  . CARDIAC EVENT MONITOR     Prior Assessment and Plan Problem List as of 02/19/2013   Orthostatic hypotension   IDDM   Last Assessment & Plan   12/31/2012 Office Visit Written 01/03/2013  9:36 PM by Fayrene Helper, MD     Uncontrolled, now followed by endo.    HYPERLIPIDEMIA   Last Assessment & Plan   01/20/2013 Office Visit Written 01/20/2013  3:16 PM by Yehuda Savannah, MD     Reasonable control of hyperlipidemia when last assessed in 2012. Repeat testing will be performed.    OBESITY   Last Assessment & Plan   12/31/2012 Office Visit Written 01/03/2013  9:38 PM by Fayrene Helper, MD     Unchanged Patient re-educated about  the importance of commitment to a  minimum of 150 minutes of exercise per week. The importance of healthy food choices with portion control discussed. Encouraged to start a food diary, count calories and to consider  joining a support group. Sample diet sheets offered. Goals set by the patient for the next several months.       Microcytic anemia   Anxiety and depression   Last Assessment & Plan   12/31/2012 Office Visit Written 01/03/2013  9:37 PM by Fayrene Helper, MD     Improved, now also seeing therapist which has helped [place]   01/20/2013 Office Visit Written 01/20/2013  3:17 PM by Yehuda Savannah, MD     Blood pressure control generally good with occasional systolic of [id number] mmHg. Current medication will be continued for now    GERD   Last Assessment & Plan   12/31/2012 Office Visit Written 01/03/2013  9:35 PM by Fayrene Helper, MD      Controlled, no change in medication     OSTEOARTHRITIS   Last Assessment & Plan   12/31/2012 Office Visit Written 01/03/2013  9:36 PM by Fayrene Helper, MD     Limitation in mobility, ambulates with assistance, no falls to date    NECK PAIN, CHRONIC   TREMOR   URINARY INCONTINENCE   UNSTEADY GAIT   Headache   Seizure   Last Assessment & Plan   02/04/2012 Office Visit Written 02/04/2012  6:46 PM by Fayrene Helper, MD     New witnessed seizure being followed by neurology, currently on kepra, no seizures since d/c     Hearing loss   Palpitations   Last Assessment & Plan   01/20/2013 Office Visit Written 01/22/2013 10:41 AM by Yehuda Savannah, MD     Based upon history, palpitations sound as if they probably represent sinus tachycardia caused by release of epinephrine. For further evaluation, 3 weeks of event recording will be undertaken.    Atypical chest pain   Last Assessment & Plan   01/20/2013 Office Visit Written 01/22/2013 10:40 AM by Yehuda Savannah, MD     Current symptoms are not highly suggestive for myocardial ischemia, but  considering patient's multiple risk factors, evaluation for possible CAD would be prudent. An echocardiogram and pharmacologic stress nuclear study will be obtained towards this end.        Imaging: No results found.

## 2013-02-19 NOTE — Patient Instructions (Addendum)
Your physician recommends that you schedule a follow-up appointment in: AS NEEDED  

## 2013-02-20 ENCOUNTER — Other Ambulatory Visit: Payer: Self-pay | Admitting: Family Medicine

## 2013-02-20 ENCOUNTER — Telehealth: Payer: Self-pay | Admitting: Family Medicine

## 2013-02-20 DIAGNOSIS — G4710 Hypersomnia, unspecified: Secondary | ICD-10-CM

## 2013-02-20 NOTE — Telephone Encounter (Signed)
Pls let pt know that I heard from Dr [name] and he recommends re evaluation for sleep apnea , so I have put in a referral and follow through on this please

## 2013-02-20 NOTE — Telephone Encounter (Signed)
ls let pt know i have received a request for a motorized wheelchair, she will need PT to evaluate her for this and certify she needs one before I prescribe. I also HIGHLY recommend if she wants  This she gets this through a local company like [employer] ,as this tends to be problematic. I have replaced the wheelchair request in the "folder' on the shelf

## 2013-02-23 ENCOUNTER — Telehealth: Payer: Self-pay | Admitting: Family Medicine

## 2013-02-23 DIAGNOSIS — E109 Type 1 diabetes mellitus without complications: Secondary | ICD-10-CM

## 2013-02-23 DIAGNOSIS — E785 Hyperlipidemia, unspecified: Secondary | ICD-10-CM

## 2013-02-23 NOTE — Telephone Encounter (Signed)
Called to notify patient.  No answer.  Will retry x 1.

## 2013-02-23 NOTE — Telephone Encounter (Signed)
If she has not had HBA1C, fasting li[pid, cmp and EGFR this year she needs asap, I will review and move forward from there. I do not see a record in her chart

## 2013-02-24 NOTE — Telephone Encounter (Signed)
Called patient no answer.

## 2013-02-24 NOTE — Telephone Encounter (Signed)
Called patient and no answer.

## 2013-02-26 NOTE — Telephone Encounter (Signed)
Called patient and line was busy x 2

## 2013-02-26 NOTE — Telephone Encounter (Signed)
Called patient- line busy x 2. Will address when she calls back

## 2013-02-26 NOTE — Telephone Encounter (Signed)
Patient aware and will have blood drawn 6/6

## 2013-02-27 LAB — LIPID PANEL
Cholesterol: 157 mg/dL (ref 0–200)
HDL: 64 mg/dL (ref >39)
LDL Cholesterol: 70 mg/dL (ref 0–99)
Total CHOL/HDL Ratio: 2.5 Ratio
Triglycerides: 115 mg/dL (ref <150)
VLDL: 23 mg/dL (ref 0–40)

## 2013-02-27 LAB — COMPLETE METABOLIC PANEL WITH GFR
ALT: 19 U/L (ref 0–35)
AST: 19 U/L (ref 0–37)
Albumin: 4.3 g/dL (ref 3.5–5.2)
Alkaline Phosphatase: 110 U/L (ref 39–117)
BUN: 15 mg/dL (ref 6–23)
CO2: 32 mEq/L (ref 19–32)
Calcium: 10.1 mg/dL (ref 8.4–10.5)
Chloride: 101 mEq/L (ref 96–112)
Creat: 0.74 mg/dL (ref 0.50–1.10)
GFR, Est African American: >89 mL/min
GFR, Est Non African American: 83 mL/min
Glucose, Bld: 123 mg/dL — ABNORMAL HIGH (ref 70–99)
Potassium: 3.8 mEq/L (ref 3.5–5.3)
Sodium: 142 mEq/L (ref 135–145)
Total Bilirubin: 0.3 mg/dL (ref 0.3–1.2)
Total Protein: 7.3 g/dL (ref 6.0–8.3)

## 2013-02-27 LAB — HEMOGLOBIN A1C
Hgb A1c MFr Bld: 7.9 % — ABNORMAL HIGH (ref <5.7)
Mean Plasma Glucose: 180 mg/dL — ABNORMAL HIGH (ref <117)

## 2013-03-04 ENCOUNTER — Ambulatory Visit (INDEPENDENT_AMBULATORY_CARE_PROVIDER_SITE_OTHER): Payer: Medicare Other | Admitting: Psychiatry

## 2013-03-04 ENCOUNTER — Other Ambulatory Visit: Payer: Self-pay | Admitting: Family Medicine

## 2013-03-04 DIAGNOSIS — F329 Major depressive disorder, single episode, unspecified: Secondary | ICD-10-CM

## 2013-03-04 DIAGNOSIS — F419 Anxiety disorder, unspecified: Secondary | ICD-10-CM

## 2013-03-04 NOTE — Progress Notes (Signed)
Patient:  Laura Atkins   DOB: 1941/02/21  MR Number: [id number]  Location: [place]:  [address] [place],  [place], [address]  Start: Wednesday 03/04/2013 11:00 AM End: Wednesday 03/04/2013 11:55 AM  Provider/Observer:     Maurice Small, MSW, LCSW   Chief Complaint:      Chief Complaint  Patient presents with  . Anxiety  . Depression    Reason For Service:     The patient was referred for services by primary care physician Dr. Tula Nakayama do to patient experiencing symptoms of depression. The patient has a long-standing history of depression. She was referred to this practice for continuity of care as she was seen at [facility] but was having difficulty scheduling a time to see a therapist. Her stressors included marital issues, declining health, family issues, and financial problems. Patient is seen for a follow up appointment today.  Interventions Strategy:  Supportive therapy, cognitive behavioral therapy  Participation Level:   Active  Participation Quality:  Appropriate      Behavioral Observation:  Casual, Alert, and Appropriate., talkative  Current Psychosocial Factors: Patient's son and his girlfriend temporarily a residing with patient and her husband.  Content of Session:   Reviewing symptoms, reinforcing patient's efforts to set and maintain boundaries, working with patient to identify coping statements, reviewing relaxation techniques  Current Status:   The patient reports improved mood but continued anxiety along with increased irritability.  Patient Progress:   Fair. Patient reports stress related to son and his girlfriend residing in her home. She expresses resentment regarding the girlfriend's house keeping habits. Patient is trying to set boundaries with her son and his girlfriend. She also reports embarrassment and disapproval of the girlfriend's attire when she accompanies patient to church and various other places.Therapist works with patient  to process her feelings and to identify her thought patterns. Patient continues to worry about others' opinions and admits continued poor self acceptance. Therapist also works with patient to review relaxation techniques encourages patient to maintain involvement in other activities. Therapist also encourages patient to plan time for self.  Last Reviewed:   08/21/2010  Goals Addressed Today:    Decrease anxiety and excessive worry  Impression/Diagnosis:   The patient has been suffering symptoms of depression intermittently for the past 6-7 years. She continues to experience anxiety, excessive worrying, and depressed mood. Diagnosis: Depressive disorder NOS, anxiety disorder NOS  Diagnosis:  Axis I:  Depressive Disorder          Axis II: Deferred

## 2013-03-04 NOTE — Patient Instructions (Signed)
Discussed orally 

## 2013-03-10 ENCOUNTER — Telehealth: Payer: Self-pay | Admitting: Family Medicine

## 2013-03-10 NOTE — Telephone Encounter (Signed)
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

## 2013-03-10 NOTE — Telephone Encounter (Signed)
Patient to be add on on 6/18 at [facility]

## 2013-03-10 NOTE — Telephone Encounter (Signed)
Called patient back- phone busy

## 2013-03-10 NOTE — Telephone Encounter (Signed)
Feet and lower legs are swollen and she has tried elevating them and drinking more water and wearing her diabetic stockings. Has been taking the furosemide twice daily and wants to know what she should do

## 2013-03-10 NOTE — Telephone Encounter (Signed)
Message sent for work in

## 2013-03-11 ENCOUNTER — Encounter: Payer: Self-pay | Admitting: Family Medicine

## 2013-03-11 ENCOUNTER — Ambulatory Visit (INDEPENDENT_AMBULATORY_CARE_PROVIDER_SITE_OTHER): Payer: Medicare Other | Admitting: Family Medicine

## 2013-03-11 VITALS — BP 138/78 | HR 79 | Resp 16 | Ht 67.5 in | Wt 284.1 lb

## 2013-03-11 DIAGNOSIS — F329 Major depressive disorder, single episode, unspecified: Secondary | ICD-10-CM

## 2013-03-11 DIAGNOSIS — I10 Essential (primary) hypertension: Secondary | ICD-10-CM

## 2013-03-11 DIAGNOSIS — R600 Localized edema: Secondary | ICD-10-CM | POA: Insufficient documentation

## 2013-03-11 DIAGNOSIS — F32A Depression, unspecified: Secondary | ICD-10-CM

## 2013-03-11 DIAGNOSIS — E669 Obesity, unspecified: Secondary | ICD-10-CM

## 2013-03-11 DIAGNOSIS — E785 Hyperlipidemia, unspecified: Secondary | ICD-10-CM

## 2013-03-11 DIAGNOSIS — E109 Type 1 diabetes mellitus without complications: Secondary | ICD-10-CM

## 2013-03-11 DIAGNOSIS — R609 Edema, unspecified: Secondary | ICD-10-CM

## 2013-03-11 DIAGNOSIS — F341 Dysthymic disorder: Secondary | ICD-10-CM

## 2013-03-11 DIAGNOSIS — M1990 Unspecified osteoarthritis, unspecified site: Secondary | ICD-10-CM

## 2013-03-11 MED ORDER — POTASSIUM CHLORIDE CRYS ER 20 MEQ PO TBCR: 20.0000 meq | EXTENDED_RELEASE_TABLET | Freq: Every day | ORAL | Status: DC

## 2013-03-11 MED ORDER — FUROSEMIDE 40 MG PO TABS: 40.0000 mg | ORAL_TABLET | Freq: Two times a day (BID) | ORAL | Status: DC

## 2013-03-11 NOTE — Progress Notes (Signed)
  Subjective:    Patient ID: Laura Atkins, female    DOB: [birth year], 72 y.o.   MRN: [id number]  HPI 1 month h/o increased bilateral leg swelling, feels as though she is tiring more easily, still uses the same 2 pillows lying down, and denies PND, Denies chest pain , or paplitations   Review of Systems     Objective:   Physical Exam        Assessment & Plan:

## 2013-03-11 NOTE — Patient Instructions (Addendum)
F/u in 3 month, call if you need me before  INCREASE furosemide to twice daily  Start potassium one daily  Non fasting chem 7  In 2 weeks please  Recent llabs showed excellent cholesterol, blood sugar, liver and kidney function whic is great.  Get tested fro sleep apnea, cut out sweets and sugar and lose weight please!

## 2013-03-12 ENCOUNTER — Encounter: Payer: Self-pay | Admitting: Neurology

## 2013-03-12 ENCOUNTER — Ambulatory Visit (INDEPENDENT_AMBULATORY_CARE_PROVIDER_SITE_OTHER): Payer: Medicare Other | Admitting: Neurology

## 2013-03-12 VITALS — BP 139/84 | HR 74 | Ht 67.0 in | Wt 279.0 lb

## 2013-03-12 DIAGNOSIS — G609 Hereditary and idiopathic neuropathy, unspecified: Secondary | ICD-10-CM

## 2013-03-12 DIAGNOSIS — G629 Polyneuropathy, unspecified: Secondary | ICD-10-CM | POA: Insufficient documentation

## 2013-03-12 DIAGNOSIS — E109 Type 1 diabetes mellitus without complications: Secondary | ICD-10-CM

## 2013-03-12 MED ORDER — DICLOFENAC SODIUM 1 % TD GEL: 4.0000 g | Freq: Four times a day (QID) | TRANSDERMAL | Status: DC

## 2013-03-12 NOTE — Progress Notes (Signed)
History of Present Illness:   Laura Atkins is a 72 y/o AA female, accompanied by her granddaughter for evaluation of gait difficulty, she has severe diabetic peripheral neuropathy, developed right lumbosacral packs neuropathy when diabetes was out of control with A1c of 13, in may 2013,  She has past medical history of diabetes for more than 10 years,diabetic peripheral neuropathy,anxiety, hypertension, hyperlipidemia  She was admiitted to [facility], later transferred to [facility] in May 2013 for generalized weakness dizziness, persistent vomiting, decreased PO, A1C 13 upon presentation.  On 5/3 the patient developed a low grade fever.  She had an episode of projectile vomiting in the morning, in the afternoon she had one tonic clonic seizure.  Then developed respiratory distress, was intubated and moved to the ICU.   It was felt her seizure was due to Xanax withdrawal.   MRI brain  in May 2013: Moderate small vessel disease type changes. Global atrophy without hydrocephalus.  MRI lumbar 2012: Scoliosis convex to the left at the apex at L3. L4-5:  Bilateral facet arthropathy with 2 mm of anterolisthesis. Bulging of the disc.  Mild narrowing of the right lateral recess.  L5-S1:  Facet arthropathy without slippage.  No significant disc pathology.   EEG Jan 23, 2012, was abnormal.  The slowing seen over the right hemisphere, worse in the posterior right quadrant suggest an underlying functional or structural abnormality in that location.  No interictal epileptiform discharges were seen.  Since the admission, she has right anterior shin paresthesia, worsening gait difficulty, right leg weakness. worsening urinary urgency,incontinence. she has chronic low back pain  Electrodiagnostic study confirmed a severe axonal peripheral neuropathy, there was also evidence of active denervation at bilateral lumbar sacral myotomes, right side is much worse than the left side, consistent with right lumbosacral plexopathy, severe  peripheral neuropathy most likely due to her poorly controlled diabetes. She had gone through a few sessions of physical therapy at [facility], reported mild to moderate improvement, was able to progress from walker to 4 foot cane, she continued to have severe right distal leg weakness, she is obese, very unsteady gait, she is also going through mental health for treatment of depression anxiety.  UPDATE June 19th; She came in with her daughter, granddaughter, she lives with her husband. Her gait overall has improved, she ambulate with a walker for longer distance, use cane for short distances, continued to have bilateral feet paresthesia, right leg weakness,   Review of Systems  Out of a complete 14 system review, the patient complains of only the following symptoms, and all other reviewed systems are negative.   Constitutional:   N/A Cardiovascular:  N/A Ear/Nose/Throat:  N/A Skin: N/A Eyes: N/A Respiratory: N/A Gastroitestinal: N/A    Hematology/Lymphatic:  N/A Endocrine:  N/A Musculoskeletal:N/A Allergy/Immunology: N/A Neurological: Insomnia, snoring, Psychiatric:    N/A   Physical Exam  Neck: supple no carotid bruits Respiratory: clear to auscultation bilaterally Cardiovascular: regular rate rhythm  Neurologic Exam  Mental Status: obese, awake, alert, cooperative to history, talking, and casual conversation. Cranial Nerves: CN II-XII pupils were equal round reactive to light.  Fundi were sharp bilaterally.  Extraocular movements were full.  Visual fields were full on confrontational test.  Facial sensation and strength were normal.  Hearing was intact to finger rubbing bilaterally.  Uvula tongue were midline.  Head turning and shoulder shrugging were normal and symmetric.  Tongue protrusion into the cheeks strength were normal.  Motor: she has hip flexion 5-/5, knee flexion 4+/5,  knee extension 5/5, ankle dorsiflexion 3/4+, ankle plantar flexion 3+/5. Sensory: Length  dependent decreased light touch, pinprick to knee level, decreased toe  proprioception,  Not reliable on her sensory exam Coordination: Normal finger-to-nose, heel-to-shin.  There was no dysmetria noticed. Gait and Station: obese, need to push up to get up from seated position, right foot drop, unsteady gait. ambulates with walker,  Reflexes: Deep tendon reflexes: Biceps: 1/1, Brachioradialis: 1/1, Triceps: 1/1, Pateller: 0/0, Achilles: 0/0.  Plantar responses are flexor.   Assessment and Plan: 72 years old [place] American female, with long-standing poorly controlled diabetes, A1C more than 13, now 7.9,  Hx of  One generalized seizure, likely due to the Xanax withdraw, also  severe axonal peripheral neuropathy and right  lumbar plexopathy due to poorly controlled diabetes,    1.  Continue moderate exercise, gait training 2. return to clinic in 6 months.

## 2013-03-13 ENCOUNTER — Encounter (HOSPITAL_COMMUNITY): Payer: Self-pay | Admitting: Psychiatry

## 2013-03-13 ENCOUNTER — Ambulatory Visit (INDEPENDENT_AMBULATORY_CARE_PROVIDER_SITE_OTHER): Payer: Medicare Other | Admitting: Psychiatry

## 2013-03-13 ENCOUNTER — Other Ambulatory Visit (HOSPITAL_COMMUNITY): Payer: Self-pay | Admitting: Psychiatry

## 2013-03-13 VITALS — BP 132/80 | Ht 66.75 in | Wt 283.6 lb

## 2013-03-13 DIAGNOSIS — F329 Major depressive disorder, single episode, unspecified: Secondary | ICD-10-CM

## 2013-03-13 DIAGNOSIS — E669 Obesity, unspecified: Secondary | ICD-10-CM

## 2013-03-13 DIAGNOSIS — F32A Depression, unspecified: Secondary | ICD-10-CM

## 2013-03-13 MED ORDER — SERTRALINE HCL 50 MG PO TABS: ORAL_TABLET | ORAL | Status: DC

## 2013-03-13 MED ORDER — SERTRALINE HCL 100 MG PO TABS: ORAL_TABLET | ORAL | Status: DC

## 2013-03-13 NOTE — Assessment & Plan Note (Signed)
Controlled, no change in medication Hyperlipidemia:Low fat diet discussed and encouraged.  \ 

## 2013-03-13 NOTE — Assessment & Plan Note (Signed)
Controlled, no change in medication Followed by endo. Patient advised to reduce carb and sweets, commit to regular physical activity, take meds as prescribed, test blood as directed, and attempt to lose weight, to improve blood sugar control.

## 2013-03-13 NOTE — Assessment & Plan Note (Signed)
Deteriorated. Patient re-educated about  the importance of commitment to a  minimum of 150 minutes of exercise per week. The importance of healthy food choices with portion control discussed. Encouraged to start a food diary, count calories and to consider  joining a support group. Sample diet sheets offered. Goals set by the patient for the next several months.    

## 2013-03-13 NOTE — Assessment & Plan Note (Signed)
Controlled, no change in medication DASH diet and commitment to daily physical activity for a minimum of 30 minutes discussed and encouraged, as a part of hypertension management. The importance of attaining a healthy weight is also discussed.  

## 2013-03-13 NOTE — Addendum Note (Signed)
Addended by: Darrol Jump on: 03/13/2013 12:00 PM   Modules accepted: Orders

## 2013-03-13 NOTE — Patient Instructions (Addendum)
In keeping with [facility]'s "BOLD NEW FUTURE" it would be very healthy for you to  CUT BACK/CUT OUT on sugar and carbohydrates, that means very limited fruits and starchy vegetables and very limited grains, breads  The goal is low GLYCEMIC INDEX.  CUT OUT all wheat, rye, or barley for the GLUTEN in them.  HIGH fat and LOW carbohydrate diet is the KEY.  Eat avocados, eggs, lean meat like grass fed beef and chicken  Nuts and seeds would be good foods as well.   Stevia is an excellent sweetener.  Safe for the brain.   Monia Pouch is also a good safe sweetener, not the baking blend form of Truvia  Almond butter is awesome.  Check out all this on the Internet.  Dr Annice Needy is on the Internet with some good info about this.   http://www.drperlmutter.com is where that is.  An excellent site for info on this diet is http://paleoleap.com  Lily's Chocolate makes dark chocolate that is sweetened with Stevia that is safe.  Ned Card is a soda sweetened with Stevia and is available at [employer] among other places.  Relaxation is the ultimate solution for you.  You can seek it through tub baths, bubble baths, essential oils or incense, walking or chatting with friends, listening to soft music, watching a candle burn and just letting all thoughts go and appreciating the true essence of the Creator.  Pets or animals may be very helpful.  You might spend some time with them and then go do more directed meditation.  "I am Wishes Fulfilled Meditation" by Darla Lesches and Huey Romans may be helpful MUSIC for getting to sleep or for meditating You can order it from on line.  You might find the Chill channel on Pandora and explore the artists that you like better.   Get to an [place] meeting to see if that could help  Ask your doctor if INDERAL could help the tremor and very bad handwriting.  Call if problems or concerns.

## 2013-03-13 NOTE — Assessment & Plan Note (Signed)
Pt not on correct dose of lasix, will correct same, has been taking one instead of 2 daily in recent times
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
## 2013-03-13 NOTE — Assessment & Plan Note (Signed)
Improved and controlled on current regime through mental health

## 2013-03-13 NOTE — Assessment & Plan Note (Signed)
Sevre, ambulates with assistive device for stability, no fall history since last visit

## 2013-03-16 ENCOUNTER — Other Ambulatory Visit (HOSPITAL_COMMUNITY): Payer: Self-pay | Admitting: Psychiatry

## 2013-03-16 ENCOUNTER — Institutional Professional Consult (permissible substitution): Payer: Self-pay | Admitting: Pulmonary Disease

## 2013-03-17 ENCOUNTER — Telehealth (HOSPITAL_COMMUNITY): Payer: Self-pay | Admitting: Psychiatry

## 2013-03-17 NOTE — Telephone Encounter (Signed)
Cassy from pharmacy called to clarify disp NUMBER for Zolfot 50.  4 a day requires 120.  I approved that number over the phone.

## 2013-03-19 ENCOUNTER — Institutional Professional Consult (permissible substitution): Payer: Self-pay | Admitting: Pulmonary Disease

## 2013-03-24 ENCOUNTER — Ambulatory Visit: Payer: Medicare Other | Admitting: Family Medicine

## 2013-03-24 ENCOUNTER — Telehealth: Payer: Self-pay | Admitting: Family Medicine

## 2013-03-25 ENCOUNTER — Other Ambulatory Visit: Payer: Self-pay | Admitting: Family Medicine

## 2013-03-25 ENCOUNTER — Telehealth: Payer: Self-pay | Admitting: Family Medicine

## 2013-03-25 ENCOUNTER — Ambulatory Visit (INDEPENDENT_AMBULATORY_CARE_PROVIDER_SITE_OTHER): Payer: Medicare Other | Admitting: Psychiatry

## 2013-03-25 DIAGNOSIS — F329 Major depressive disorder, single episode, unspecified: Secondary | ICD-10-CM

## 2013-03-25 DIAGNOSIS — F419 Anxiety disorder, unspecified: Secondary | ICD-10-CM

## 2013-03-25 MED ORDER — OXYBUTYNIN 3.9 MG/24HR TD PTTW: 1.0000 | MEDICATED_PATCH | TRANSDERMAL | Status: DC

## 2013-03-25 NOTE — Telephone Encounter (Signed)
Patient walked in to office.  States that she is not having symptoms of UTI but urinary incontinence.  She taking furosemide as prescribed and is having problems with wetting herself.  Please advise.

## 2013-03-25 NOTE — Telephone Encounter (Signed)
oxytrol patch has been sent to her pharmacy to see if this will help. Hs seen urology in the past for this problem and will likely need to go back if this does not help much call back next 1 to 3 weekst week for referral, pls let her knwo

## 2013-03-25 NOTE — Telephone Encounter (Signed)
Patient did not come in
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
## 2013-03-25 NOTE — Telephone Encounter (Signed)
Patient aware.

## 2013-03-25 NOTE — Patient Instructions (Signed)
Discussed orally 

## 2013-04-03 LAB — BASIC METABOLIC PANEL
BUN: 15 mg/dL (ref 6–23)
CO2: 29 mEq/L (ref 19–32)
Calcium: 9.8 mg/dL (ref 8.4–10.5)
Chloride: 99 mEq/L (ref 96–112)
Creat: 0.81 mg/dL (ref 0.50–1.10)
Glucose, Bld: 181 mg/dL — ABNORMAL HIGH (ref 70–99)
Potassium: 4.4 mEq/L (ref 3.5–5.3)
Sodium: 139 mEq/L (ref 135–145)

## 2013-04-08 ENCOUNTER — Telehealth: Payer: Self-pay | Admitting: Family Medicine

## 2013-04-08 NOTE — Telephone Encounter (Signed)
Patient aware of results of bmp

## 2013-04-09 ENCOUNTER — Ambulatory Visit: Payer: Self-pay | Admitting: Family Medicine

## 2013-04-10 ENCOUNTER — Ambulatory Visit (INDEPENDENT_AMBULATORY_CARE_PROVIDER_SITE_OTHER): Payer: Medicare Other | Admitting: Pulmonary Disease

## 2013-04-10 ENCOUNTER — Encounter: Payer: Self-pay | Admitting: Pulmonary Disease

## 2013-04-10 VITALS — BP 122/68 | HR 80 | Temp 97.9°F | Ht 67.5 in | Wt 281.8 lb

## 2013-04-10 DIAGNOSIS — G4733 Obstructive sleep apnea (adult) (pediatric): Secondary | ICD-10-CM | POA: Insufficient documentation

## 2013-04-10 NOTE — Assessment & Plan Note (Signed)
The patient has a history of moderate to severe sleep apnea dating back to 2010.  She was started on CPAP, but could not tolerate because of a "smothering sensation".  The patient has continued to have significant symptoms at night, and also significant daytime sleepiness with inactivity.  She has actually gained more weight since her study in 2010, and I suspect her sleep apnea has worsened.  CPAP coupled with weight loss are really her best treatment options, and although hesitant, the patient is willing to give this a try.  I will have her machine updated, and we'll also arrange for a new mask and supplies.  I will start her out at a low pressure in order to help with desensitization, and at some point her pressure will need to be optimized again at home.  Finally, I have encouraged her to work aggressively on weight loss.

## 2013-04-10 NOTE — Patient Instructions (Addendum)
Will try cpap again.  Will start at lower pressure, and will have the equipment company show you how to use the ramp in case you feel the pressure is too high Wear the cpap during the day while watching tv for about 63min in am and afternoon. Work on weight loss followup with me in 6 weeks, but call if having issues with your cpap machine.

## 2013-04-10 NOTE — Progress Notes (Signed)
Subjective:    Patient ID: Laura Atkins, female    DOB: 1940-11-02, 72 y.o.   MRN: [id number]  HPI The patient is a 72 year old female who I've been asked to see for management of obstructive sleep apnea.  She was first diagnosed in 2010 with moderate to severe OSA, with an AHI of 32 events per hour.  She was started on CPAP at that time, but could not tolerate because of "smothering".  She wore the device for less than 6 months.  Since that time, she is continued to have loud snoring, and she notes that her husband complains of an abnormal breathing pattern during sleep.  She is not rested in the mornings upon arising, and will fall asleep easily during the day if she sits down.  She also will fall asleep easily watching television or movies in the evening.  She does not currently drive.  The patient's weight is up 30 pounds over the last 2 years, and her Epworth score today is 20.  Sleep Questionnaire What time do you typically go to bed?( Between what hours) 1030p 1030p at 1140 on 04/10/13 by Danella Maiers, CMA How long does it take you to fall asleep? 5 minutes 5 minutes at 1140 on 04/10/13 by Ivy Lynn Mabe, CMA How many times during the night do you wake up? 2 2 at 1140 on 04/10/13 by Danella Maiers, CMA What time do you get out of bed to start your day? [id number] 10-11a at 1140 on 04/10/13 by Danella Maiers, CMA Do you drive or operate heavy machinery in your occupation? No No at 1140 on 04/10/13 by Danella Maiers, CMA How much has your weight changed (up or down) over the past two years? (In pounds) 30 lb (13.608 kg)30 lb (13.608 kg) decrease at 1140 on 04/10/13 by Ivy Lynn Mabe, CMA Have you ever had a sleep study before? Yes Yes at 1140 on 04/10/13 by Ivy Lynn Mabe, CMA If yes, location of study? [facility] at 1140 on 04/10/13 by Danella Maiers, CMA If yes, date of study? 2010 2010 at 1140 on 04/10/13 by Danella Maiers, CMA Do you currently use CPAP? No No at 1140 on  04/10/13 by Ivy Lynn Mabe, CMA Do you wear oxygen at any time? No No at 1140 on 04/10/13 by Ivy Lynn Mabe, CMA    Review of Systems  Constitutional: Negative for fever and unexpected weight change.  HENT: Negative for ear pain, nosebleeds, congestion, sore throat, rhinorrhea, sneezing, trouble swallowing, dental problem, postnasal drip and sinus pressure.   Eyes: Negative for redness and itching.  Respiratory: Negative for cough, chest tightness, shortness of breath and wheezing.   Cardiovascular: Positive for leg swelling. Negative for palpitations.  Gastrointestinal: Negative for nausea and vomiting.  Genitourinary: Negative for dysuria.  Musculoskeletal: Negative for joint swelling.  Skin: Negative for rash.  Neurological: Negative for headaches.  Hematological: Does not bruise/bleed easily.  Psychiatric/Behavioral: Negative for dysphoric mood. The patient is not nervous/anxious.        Objective:   Physical Exam Constitutional:  Obese female, no acute distress  HENT:  Nares patent without discharge  Oropharynx without exudate, palate and uvula are thickened and elongated.   Eyes:  Perrla, eomi, no scleral icterus  Neck:  No JVD, no TMG  Cardiovascular:  Normal rate, regular rhythm, no rubs or gallops.  No murmurs        Intact distal pulses  Pulmonary :  Normal breath sounds,  no stridor or respiratory distress   No rales, rhonchi, or wheezing  Abdominal:  Soft, nondistended, bowel sounds present.  No tenderness noted.   Musculoskeletal: 2+ lower extremity edema noted.  Lymph Nodes:  No cervical lymphadenopathy noted  Skin:  No cyanosis noted  Neurologic:  Alert, appropriate, moves all 4 extremities without obvious deficit.         Assessment & Plan:

## 2013-04-15 ENCOUNTER — Ambulatory Visit (INDEPENDENT_AMBULATORY_CARE_PROVIDER_SITE_OTHER): Payer: Medicare Other | Admitting: Psychiatry

## 2013-04-15 DIAGNOSIS — F329 Major depressive disorder, single episode, unspecified: Secondary | ICD-10-CM

## 2013-04-15 DIAGNOSIS — F419 Anxiety disorder, unspecified: Secondary | ICD-10-CM

## 2013-04-15 NOTE — Progress Notes (Signed)
Patient:  Laura Atkins   DOB: [birth year]  MR Number: [id number]  Location: [facility]:  [address] [place],  [place], [address]  Start: Wednesday 04/15/2013 11:05 AM End: Wednesday 04/15/2013 11:55 AM  Provider/Observer:     Maurice Small, MSW, LCSW   Chief Complaint:      Chief Complaint  Patient presents with  . Anxiety  . Depression    Reason For Service:     The patient was referred for services by primary care physician Dr. Tula Nakayama do to patient experiencing symptoms of depression. The patient has a long-standing history of depression. She was referred to this practice for continuity of care as she was seen at [facility] but was having difficulty scheduling a time to see a therapist. Her stressors included marital issues, declining health, family issues, and financial problems. Patient is seen for a follow up appointment today.  Interventions Strategy:  Supportive therapy, cognitive behavioral therapy  Participation Level:   Active  Participation Quality:  Appropriate      Behavioral Observation:  Casual, Alert, and Appropriate., talkative  Current Psychosocial Factors: Patient reports increased health issues.  Content of Session:   Reviewing symptoms, ventilation and validation of feelings working with patient to identify coping statements, discussing boundary issues in patient's relationships, reviewing relaxation techniques  Current Status:   The patient reports depressed mood, fatigue, anxiety, and increased irritability.  Patient Progress:   Fair. Patient reports depressed mood and excessive worry along with irritability. She expresses frustration regarding several issues including her grandsons involvement with a woman of whom patient disapproves and issues at her church regarding repairs.  She also expresses frustration regarding repairs not being completed at her home. She expresses sadness about her decreased function and and worry about recent  chest pain. She has reported chest pains to her doctors who don't seem to be concerned about it per patient's report. Patient is scheduled to have CPAP machine adjusted today and hopes this will improve her quality of sleep. Therapist works with patient to process feelings, identify coping statements, and review relaxation techniques.  Goals Addressed Today:    Decrease anxiety and excessive worry  Impression/Diagnosis:   The patient has been suffering symptoms of depression intermittently for the past 6-7 years. She continues to experience anxiety, excessive worrying, and depressed mood. Diagnosis: Depressive disorder NOS, anxiety disorder NOS  Diagnosis:  Axis I:  Depressive Disorder          Axis II: Deferred

## 2013-04-15 NOTE — Patient Instructions (Signed)
Discussed orally 

## 2013-04-27 ENCOUNTER — Telehealth: Payer: Self-pay | Admitting: Family Medicine

## 2013-04-27 NOTE — Telephone Encounter (Signed)
Offered an appt. Having UTI symptoms, burning at the end of stream and lower back pain. Wants to drink cranberry juice for a few days and see if she gets better before making appt. Advised to call and schedule appt if symptoms persist

## 2013-04-30 ENCOUNTER — Ambulatory Visit (INDEPENDENT_AMBULATORY_CARE_PROVIDER_SITE_OTHER): Payer: Medicare Other | Admitting: Family Medicine

## 2013-04-30 ENCOUNTER — Encounter: Payer: Self-pay | Admitting: Family Medicine

## 2013-04-30 VITALS — BP 142/68 | HR 80 | Resp 18 | Ht 67.5 in | Wt 280.0 lb

## 2013-04-30 DIAGNOSIS — IMO0002 Reserved for concepts with insufficient information to code with codable children: Secondary | ICD-10-CM

## 2013-04-30 DIAGNOSIS — M79609 Pain in unspecified limb: Secondary | ICD-10-CM

## 2013-04-30 DIAGNOSIS — E109 Type 1 diabetes mellitus without complications: Secondary | ICD-10-CM

## 2013-04-30 DIAGNOSIS — R259 Unspecified abnormal involuntary movements: Secondary | ICD-10-CM

## 2013-04-30 DIAGNOSIS — M79671 Pain in right foot: Secondary | ICD-10-CM

## 2013-04-30 DIAGNOSIS — I10 Essential (primary) hypertension: Secondary | ICD-10-CM

## 2013-04-30 DIAGNOSIS — R251 Tremor, unspecified: Secondary | ICD-10-CM | POA: Insufficient documentation

## 2013-04-30 DIAGNOSIS — S90822A Blister (nonthermal), left foot, initial encounter: Secondary | ICD-10-CM

## 2013-04-30 HISTORY — DX: Pain in right foot: M79.671

## 2013-04-30 NOTE — Progress Notes (Signed)
  Subjective:    Patient ID: Laura Atkins, female    DOB: 1941/03/30, 72 y.o.   MRN: [id number]  HPI Pt in due to concern of a visiting home health nurse for a blister on her foot observed yesterday. No redness , warmth drainage or fever, blister is clear, non purulent appearing. No recall of inciting incident or trauma. No recall of ill fitting footwear. C/o increasing hand tremor, right , unable to write. Blood sugar reportedly doing well Depression stable. Feels as though there is a "sink" in the right foot, exam is normal, however will f/u with xray  Review of Systems See HPI Denies recent fever or chills. Denies sinus pressure, nasal congestion, ear pain or sore throat. Denies chest congestion, productive cough or wheezing. Denies chest pains, palpitations and leg swelling Denies abdominal pain, nausea, vomiting,diarrhea or constipation.   Denies dysuria, frequency, hesitancy or incontinence. Chronic back, hip and knee pain with limitation in mobility. Denies headaches, seizures, numbness, or tingling. Denies uncontrolled depression, anxiety or insomnia.       Objective:   Physical Exam  Patient alert and oriented and in no cardiopulmonary distress.  HEENT: No facial asymmetry, EOMI, no sinus tenderness,  oropharynx pink and moist.  Neck decreased ROM no adenopathy.  Chest: Clear to auscultation bilaterally.  CVS: S1, S2 no murmurs, no S3.  ABD: Soft non tender. Bowel sounds normal.  Ext: No edema  MS: Adequate though reduced  ROM spine, shoulders, hips and knees.  Skin: Intact, no ulcerations or rash noted.Blister on dorsum of left foot, max diameter 1.5 cm, no erythema , warmth or tenderness associated with this  Psych: Good eye contact, normal affect. Memory intact not anxious or depressed appearing.  CNS: CN 2-12 intact, power, rest tremor noted in right hand       Assessment & Plan:

## 2013-04-30 NOTE — Patient Instructions (Addendum)
F/u in October, call if you need me before  Do NOT burst the blister on your foot, do not wear shoes that squeeze the blister to increase risk of bursting \ If you see  Area getting red warm or pus draining within the next week, please call, so I can start an antibiotic   You referred for xray of the right foot, exam is normal except for a pad of fat  Podiatrist referral for September, Dr Irving Shows in [facility]  Neurology to evaluate tremor, referral is made

## 2013-05-03 DIAGNOSIS — S90829A Blister (nonthermal), unspecified foot, initial encounter: Secondary | ICD-10-CM | POA: Insufficient documentation

## 2013-05-03 NOTE — Assessment & Plan Note (Signed)
C/o worsening rest tremot interfering with ability to write, will refer to neurolgy she already sees forr foot drop , about this

## 2013-05-03 NOTE — Assessment & Plan Note (Signed)
No infection , size 1.5cm dia  Instructions given re appropriate footwear,as well as not to traumatize the area. She is to call back if symptoms of infection develop

## 2013-05-03 NOTE — Assessment & Plan Note (Signed)
Controlled, no change in medication  

## 2013-05-05 ENCOUNTER — Other Ambulatory Visit: Payer: Self-pay

## 2013-05-05 ENCOUNTER — Ambulatory Visit (HOSPITAL_COMMUNITY): Payer: Self-pay | Admitting: Psychiatry

## 2013-05-05 MED ORDER — BENAZEPRIL HCL 20 MG PO TABS: ORAL_TABLET | ORAL | Status: DC

## 2013-05-13 ENCOUNTER — Ambulatory Visit (INDEPENDENT_AMBULATORY_CARE_PROVIDER_SITE_OTHER): Payer: Medicare Other | Admitting: Psychiatry

## 2013-05-13 ENCOUNTER — Ambulatory Visit (HOSPITAL_COMMUNITY): Payer: Self-pay | Admitting: Psychiatry

## 2013-05-13 ENCOUNTER — Encounter (HOSPITAL_COMMUNITY): Payer: Self-pay | Admitting: Psychiatry

## 2013-05-13 VITALS — BP 130/69 | Ht 67.0 in | Wt 280.0 lb

## 2013-05-13 DIAGNOSIS — F329 Major depressive disorder, single episode, unspecified: Secondary | ICD-10-CM

## 2013-05-13 MED ORDER — SERTRALINE HCL 50 MG PO TABS: 50.0000 mg | ORAL_TABLET | Freq: Two times a day (BID) | ORAL | Status: DC
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
## 2013-05-22 ENCOUNTER — Ambulatory Visit: Payer: Self-pay | Admitting: Pulmonary Disease

## 2013-05-27 ENCOUNTER — Ambulatory Visit (HOSPITAL_COMMUNITY): Payer: Self-pay | Admitting: Psychiatry

## 2013-05-27 ENCOUNTER — Encounter (INDEPENDENT_AMBULATORY_CARE_PROVIDER_SITE_OTHER): Payer: Self-pay | Admitting: *Deleted

## 2013-05-29 ENCOUNTER — Ambulatory Visit (INDEPENDENT_AMBULATORY_CARE_PROVIDER_SITE_OTHER): Payer: Medicare Other | Admitting: Neurology

## 2013-05-29 ENCOUNTER — Encounter: Payer: Self-pay | Admitting: Neurology

## 2013-05-29 VITALS — BP 133/60 | HR 82 | Ht 68.0 in | Wt 279.0 lb

## 2013-05-29 DIAGNOSIS — E109 Type 1 diabetes mellitus without complications: Secondary | ICD-10-CM

## 2013-05-29 DIAGNOSIS — F341 Dysthymic disorder: Secondary | ICD-10-CM

## 2013-05-29 DIAGNOSIS — I10 Essential (primary) hypertension: Secondary | ICD-10-CM

## 2013-05-29 DIAGNOSIS — K219 Gastro-esophageal reflux disease without esophagitis: Secondary | ICD-10-CM

## 2013-05-29 DIAGNOSIS — F32A Depression, unspecified: Secondary | ICD-10-CM

## 2013-05-29 DIAGNOSIS — F329 Major depressive disorder, single episode, unspecified: Secondary | ICD-10-CM

## 2013-05-29 DIAGNOSIS — I951 Orthostatic hypotension: Secondary | ICD-10-CM

## 2013-05-29 DIAGNOSIS — E669 Obesity, unspecified: Secondary | ICD-10-CM

## 2013-05-29 DIAGNOSIS — E785 Hyperlipidemia, unspecified: Secondary | ICD-10-CM

## 2013-05-29 NOTE — Progress Notes (Signed)
History of Present Illness:  Laura Atkins is a 72 yo AA female, came in for evaluation of diabetic peripheral neuropathy, bilateral lumbosacral plexopathy, gait difficulty.  She has past medical history of diabetes for more than 10 years,diabetic peripheral neuropathy,anxiety, hypertension, hyperlipidemia  She was admiitted to [facility], later transferred to [facility] in May 2013 for generalized weakness dizziness, persistent vomiting, decreased PO.  On 5/3 the patient developed a low grade fever.  She had an episode of projectile vomiting in the morning of May 3rd, .  In the afternoon she lost consciousness and experienced tonic clonic seizure.  Then developed respiratory distress, was intubated and moved to the ICU.   It was felt her seizure was due to [place].  MRI brain  in May 2013: Moderate small vessel disease type changes. Global atrophy without hydrocephalus. MRI lumbar 2012: Scoliosis convex to the left at the apex at L3. L4-5:  Bilateral facet arthropathy with 2 mm of anterolisthesis. Bulging of the disc.  Mild narrowing of the right lateral recess.  L5-S1:  Facet arthropathy without slippage.  No significant disc pathology.   EEG Jan 23, 2012, was abnormal.  The slowing seen over the right hemisphere, worse in the posterior right quadrant suggest an underlying functional or structural abnormality in that location.  No interictal epileptiform discharges were seen.  Since the admission, she has right anterior shin paresthesia, worsening gait difficulty, right leg weakness. worsening urinary urgency,incontinence. she has chronic low back pain She was seen by [facility] neurologist Dr. Jacelyn Grip, [place] [facility].  Electrodiagnostic study in 08/2012 confirmed a severe axonal peripheral neuropathy, there was also evidence of active denervation at bilateral lumbar sacral myotomes, right side is much worse than the left side, consistent with bilateral lumbosacral plexopathy, most likely  due to her poorly controlled diabetes.  She had gone through a few sessions of physical therapy at [facility], reported mild to moderate improvement, was able to progress from walker to 4 foot cane, she continued to have severe right distal leg weakness, she is obese, very unsteady gait, she is also going through mental health for treatment of depression anxiety.  For a while, she wore an AFO on her right leg,  but does not have it on today, ambulating with a walker. She ran out of diabetic strips a few days ago so does not know what recent CBG has been, does not know recent A1C.   UPDATE Sep 5th 2014:  Last clinical visit was with Hoyle Sauer in March 2014, she continued to have gait difficulty, overall has improvement, she has regained some function of her right foot, ankle dorsi/plantar flexion,  Review of Systems  Out of a complete 14 system review, the patient complains of only the following symptoms, and all other reviewed systems are negative.    Weight gain, fatigue, swelling in legs, hearing loss, trouble swallowing, blurry vision, snoring, incontinence, feeling cold, joint pain, memory loss, confusion, numbness, weakness, insomnia, depression, decreased energy, change in appetite  Social History  patient lives at home wtih her husband Laura Atkins). Patient has a high school education. Patient has five children. Patient denies any history of tobacco, alcohol and illicit drugs. Patient drinks some caffeine. Inhaled Tobacco Use: never smoker  Family History  Patients parents are both deceased. Cancer   Past Medical History Headache, Obestiy, Anemia High blood pressure Convulsions Vomiting axonal peripheral neuropathy  Surgical History  None listed   Physical Exam  Neck: supple no carotid bruits Respiratory: clear to  auscultation bilaterally Cardiovascular: regular rate rhythm  Neurologic Exam  Mental Status: obese, awake, alert, cooperative to history, talking, and  casual conversation. Cranial Nerves: CN II-XII pupils were equal round reactive to light.  Fundi were sharp bilaterally.  Extraocular movements were full.  Visual fields were full on confrontational test.  Facial sensation and strength were normal.  Hearing was intact to finger rubbing bilaterally.  Uvula tongue were midline.  Head turning and shoulder shrugging were normal and symmetric.  Tongue protrusion into the cheeks strength were normal.  Motor: she has mild right hip flexion 5-, knee flexion 4+/5, knee extension 5/5, ankle dorsiflexion 3/4+, ankle plantar flexion 4/5. Sensory: Length dependent decreased light touch, pinprick to knee level, decreased toe  proprioception, and vibratory sensation. Coordination: Normal finger-to-nose, heel-to-shin.  There was no dysmetria noticed. Gait and Station: obese, need to push up to get up from seated position, right foot drop, unsteady gait. Dragging right foot some   Reflexes: Deep tendon reflexes: Biceps: 1/1, Brachioradialis: 1/1, Triceps: 1/1, Pateller: 0/0, Achilles: 0/0.  Plantar responses are flexor.   Assessment and Plan:   Laura Atkins is a 72 years old African American female, with long-standing poorly controlled diabetes, A1C was more than 13,   Hx of  seizure, likely due to the Xanax withdraw, also had new onset right lower extremity weakness, right anterior thigh paresthesia, right ankle weakness, severe axonal peripheral neuropathy and right lumbar plexopathy due to poorly controlled diabetes,    Continue moderate exercise, gait training, return to clinic with Hoyle Sauer in one year

## 2013-06-05 ENCOUNTER — Other Ambulatory Visit: Payer: Self-pay | Admitting: Family Medicine

## 2013-06-08 ENCOUNTER — Ambulatory Visit (HOSPITAL_COMMUNITY): Payer: Self-pay | Admitting: Psychiatry

## 2013-06-09 ENCOUNTER — Ambulatory Visit: Payer: Self-pay | Admitting: Family Medicine

## 2013-06-10 ENCOUNTER — Ambulatory Visit (INDEPENDENT_AMBULATORY_CARE_PROVIDER_SITE_OTHER): Payer: Self-pay | Admitting: Internal Medicine

## 2013-06-15 ENCOUNTER — Ambulatory Visit: Payer: Medicare Other | Admitting: Family Medicine

## 2013-06-17 ENCOUNTER — Telehealth (HOSPITAL_COMMUNITY): Payer: Self-pay | Admitting: Psychiatry

## 2013-06-22 ENCOUNTER — Ambulatory Visit (INDEPENDENT_AMBULATORY_CARE_PROVIDER_SITE_OTHER): Payer: Medicare Other | Admitting: Psychiatry

## 2013-06-22 DIAGNOSIS — F329 Major depressive disorder, single episode, unspecified: Secondary | ICD-10-CM

## 2013-06-22 NOTE — Progress Notes (Signed)
Patient:  Laura Atkins   DOB: [birth year]  MR Number: [id number]  Location: [facility]:  [address] [place],  [place], [address]  Start: Monday 06/22/2013 11:20 AM End: Monday 06/22/2013 11:55 AM  Provider/Observer:     Maurice Small, MSW, LCSW   Chief Complaint:      Chief Complaint  Patient presents with  . Depression  . Anxiety    Reason For Service:     The patient was referred for services by primary care physician Dr. Tula Nakayama do to patient experiencing symptoms of depression. The patient has a long-standing history of depression. She was referred to this practice for continuity of care as she was seen at [facility] but was having difficulty scheduling a time to see a therapist. Her stressors included marital issues, declining health, family issues, and financial problems. Patient is seen for a follow up appointment today.  Interventions Strategy:  Supportive therapy, cognitive behavioral therapy  Participation Level:   Active  Participation Quality:  Appropriate      Behavioral Observation:  Casual, Alert, and Appropriate., talkative  Current Psychosocial Factors: Patient reports increased marital issues.  Content of Session:   Reviewing symptoms, ventilation and validation of feelings,  working with patient and husband to improve communication skills and facilitate more support for patient  Current Status:   The patient reports depressed mood, fatigue, and  anxiety  Patient Progress:    Patient reports increased stress and frustration due to to lack of independence regarding certain areas especially driving and attending events. She expresses frustration with husband as she does not feel as though she is a priority. Therapist works with patient and husband to improve communication skills and to facilitate more support for patient.   Goals Addressed Today:    Decrease anxiety and excessive worry  Impression/Diagnosis:   The patient has been suffering  symptoms of depression intermittently for the past 6-7 years. She continues to experience anxiety, excessive worrying, and depressed mood. Diagnosis: Depressive disorder NOS, anxiety disorder NOS  Diagnosis:  Axis I:  Depressive Disorder          Axis II: Deferred

## 2013-06-22 NOTE — Patient Instructions (Signed)
Discussed orally 

## 2013-06-24 ENCOUNTER — Other Ambulatory Visit (HOSPITAL_COMMUNITY): Payer: Self-pay | Admitting: Psychiatry

## 2013-06-24 ENCOUNTER — Telehealth (HOSPITAL_COMMUNITY): Payer: Self-pay | Admitting: Psychiatry

## 2013-06-24 NOTE — Telephone Encounter (Signed)
Zoloft 100 mg #60 called to pharmacy

## 2013-06-26 ENCOUNTER — Ambulatory Visit (INDEPENDENT_AMBULATORY_CARE_PROVIDER_SITE_OTHER): Payer: Medicare Other | Admitting: Ophthalmology

## 2013-06-26 DIAGNOSIS — H43819 Vitreous degeneration, unspecified eye: Secondary | ICD-10-CM

## 2013-06-26 DIAGNOSIS — E11319 Type 2 diabetes mellitus with unspecified diabetic retinopathy without macular edema: Secondary | ICD-10-CM

## 2013-06-26 DIAGNOSIS — E1139 Type 2 diabetes mellitus with other diabetic ophthalmic complication: Secondary | ICD-10-CM

## 2013-06-26 DIAGNOSIS — I10 Essential (primary) hypertension: Secondary | ICD-10-CM

## 2013-06-26 DIAGNOSIS — H35039 Hypertensive retinopathy, unspecified eye: Secondary | ICD-10-CM

## 2013-06-26 DIAGNOSIS — H2510 Age-related nuclear cataract, unspecified eye: Secondary | ICD-10-CM

## 2013-07-13 ENCOUNTER — Encounter (INDEPENDENT_AMBULATORY_CARE_PROVIDER_SITE_OTHER): Payer: Self-pay

## 2013-07-13 ENCOUNTER — Ambulatory Visit (INDEPENDENT_AMBULATORY_CARE_PROVIDER_SITE_OTHER): Payer: Medicare Other | Admitting: Family Medicine

## 2013-07-13 ENCOUNTER — Encounter: Payer: Self-pay | Admitting: Family Medicine

## 2013-07-13 VITALS — BP 126/80 | HR 82 | Resp 18 | Ht 67.5 in | Wt 274.0 lb

## 2013-07-13 DIAGNOSIS — E785 Hyperlipidemia, unspecified: Secondary | ICD-10-CM

## 2013-07-13 DIAGNOSIS — I10 Essential (primary) hypertension: Secondary | ICD-10-CM

## 2013-07-13 DIAGNOSIS — F329 Major depressive disorder, single episode, unspecified: Secondary | ICD-10-CM

## 2013-07-13 DIAGNOSIS — G4733 Obstructive sleep apnea (adult) (pediatric): Secondary | ICD-10-CM

## 2013-07-13 DIAGNOSIS — G629 Polyneuropathy, unspecified: Secondary | ICD-10-CM

## 2013-07-13 DIAGNOSIS — E669 Obesity, unspecified: Secondary | ICD-10-CM

## 2013-07-13 DIAGNOSIS — F32A Depression, unspecified: Secondary | ICD-10-CM

## 2013-07-13 DIAGNOSIS — F341 Dysthymic disorder: Secondary | ICD-10-CM

## 2013-07-13 DIAGNOSIS — E1149 Type 2 diabetes mellitus with other diabetic neurological complication: Secondary | ICD-10-CM

## 2013-07-13 DIAGNOSIS — R32 Unspecified urinary incontinence: Secondary | ICD-10-CM

## 2013-07-13 DIAGNOSIS — Z23 Encounter for immunization: Secondary | ICD-10-CM

## 2013-07-13 DIAGNOSIS — G609 Hereditary and idiopathic neuropathy, unspecified: Secondary | ICD-10-CM

## 2013-07-13 NOTE — Progress Notes (Signed)
  Subjective:    Patient ID: Laura Atkins, female    DOB: [birth year], 72 y.o.   MRN: [id number]  HPI The PT is here for follow up and re-evaluation of chronic medical conditions, medication management and review of any available recent lab and radiology data.  Preventive health is updated, specifically  Cancer screening and Immunization.   Questions or concerns regarding consultations or procedures which the PT has had in the interim are  Addressed.Foolows regularly with psychiatry, this has been helpful, medication has not been changed,  She benefits from sessions with therapist Recent blood sugar control has not been as good as in the past, but intends to be more diligent with control of her diet C/o uncontrolled urinary incontinence which l;imits her ability to get out and be involved in community activity, will be referred back to urologist who has seen her in the past for this. Needs re evaluation for sleep apnea and refitting of her mask, she is non compliant with regular use of this stating it is stifling  Chronic back and lower extremity pain fairly well controlled on current medication Denies polyuria, polydipsia or blurred vision     Review of Systems See HPI Denies recent fever or chills. Denies sinus pressure, nasal congestion, ear pain or sore throat. Denies chest congestion, productive cough or wheezing. Denies chest pains, palpitations and leg swelling Denies abdominal pain, nausea, vomiting,diarrhea or constipation.   Denies dysuria, or hematuria, c/o increased  Frequency, denies flank pain, fever or chills  Denies headaches, seizures, numbness, or tingling. Denies uncontrolled depression, anxiety or insomnia. Denies skin break down or rash.        Objective:   Physical Exam  Patient alert and oriented and in no cardiopulmonary distress.  HEENT: No facial asymmetry, EOMI, no sinus tenderness,  oropharynx pink and moist.  Neck decreased ROM  no  adenopathy.  Chest: Clear to auscultation bilaterally.  CVS: S1, S2 no murmurs, no S3.  ABD: Soft non tender. Bowel sounds normal.  Ext: No edema  MS: Decreased  ROM spine, shoulders, hips and knees.  Skin: Intact, no ulcerations or rash noted.  Psych: Good eye contact, normal affect. Memory intact not anxious or depressed appearing.  CNS: CN 2-12 intact, power,  normal throughout.       Assessment & Plan:

## 2013-07-13 NOTE — Patient Instructions (Addendum)
Annual wellness end February, call if you need me before  Flu vaccine today   Congrats on weight loss  Please try to get back to pulmonary specialist re sleep apnea  You are referred for urology evaluation  Urine will be checked for infection, culture only

## 2013-07-14 ENCOUNTER — Encounter (HOSPITAL_COMMUNITY): Payer: Self-pay | Admitting: Psychiatry

## 2013-07-14 ENCOUNTER — Ambulatory Visit (HOSPITAL_COMMUNITY): Payer: Medicare Other | Admitting: Psychiatry

## 2013-07-15 ENCOUNTER — Other Ambulatory Visit: Payer: Self-pay | Admitting: Family Medicine

## 2013-07-15 LAB — URINE CULTURE: Colony Count: >=100000

## 2013-07-15 MED ORDER — CIPROFLOXACIN HCL 500 MG PO TABS: 500.0000 mg | ORAL_TABLET | Freq: Two times a day (BID) | ORAL | Status: AC

## 2013-08-07 ENCOUNTER — Ambulatory Visit: Payer: Self-pay | Admitting: Urology

## 2013-08-12 ENCOUNTER — Encounter (HOSPITAL_COMMUNITY): Payer: Self-pay | Admitting: Psychiatry

## 2013-08-12 ENCOUNTER — Ambulatory Visit (INDEPENDENT_AMBULATORY_CARE_PROVIDER_SITE_OTHER): Payer: Medicare Other | Admitting: Psychiatry

## 2013-08-12 VITALS — BP 110/60 | Ht 67.0 in | Wt 277.0 lb

## 2013-08-12 DIAGNOSIS — F329 Major depressive disorder, single episode, unspecified: Secondary | ICD-10-CM
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
## 2013-08-13 ENCOUNTER — Ambulatory Visit (HOSPITAL_COMMUNITY): Payer: Self-pay | Admitting: Psychiatry

## 2013-08-17 ENCOUNTER — Encounter: Payer: Self-pay | Admitting: Family Medicine

## 2013-08-23 ENCOUNTER — Telehealth: Payer: Self-pay | Admitting: Family Medicine

## 2013-08-23 NOTE — Telephone Encounter (Signed)
Pls call pt and find out if she really believes that she has had a colonoscopy in the past 10 years. I vaguely recall that she MAY have had one at [facility] within this time period. If shje confirms having had one, pls see if you can locate the report, explain that if we are unable to verify that she has hads one , then she will need one to adequately screen for colon ca and we will contact her about thios. Pls also just check with her that she has indeed gone back to Dr Gerald Leitz about her sleep apnea and that she will be seeing urology about her incontinence and recurrent UTI'S Any problems let me know

## 2013-08-23 NOTE — Assessment & Plan Note (Signed)
Continue gabapentin for symptom control

## 2013-08-23 NOTE — Assessment & Plan Note (Signed)
Deterioration on conntrol, needs to refocus on sticking with carb restricted diet, and adherence to medication

## 2013-08-23 NOTE — Assessment & Plan Note (Signed)
Refer to urology, evaluate and treat recurrent UTI and disabling incontinence

## 2013-08-23 NOTE — Assessment & Plan Note (Signed)
Controlled, no change in medication  

## 2013-08-23 NOTE — Assessment & Plan Note (Signed)
Controlled, no change in medication Hyperlipidemia:Low fat diet discussed and encouraged.  \ 

## 2013-08-23 NOTE — Assessment & Plan Note (Signed)
Improved. Pt applauded on succesful weight loss through lifestyle change, and encouraged to continue same. Weight loss goal set for the next several months.  

## 2013-08-23 NOTE — Assessment & Plan Note (Signed)
Still not using machine consistently, needs to f/u with pulmonary, already past due

## 2013-08-23 NOTE — Assessment & Plan Note (Signed)
Improved and cpontrolled with regular psychotherapy and medication management through psychiatry. Not suicidal or homicidal, no psychotic symptoms

## 2013-08-27 NOTE — Telephone Encounter (Signed)
Sent to [facility] for last colonscopy.  Patient did not like sleep apnea Dr and didn't want to have test done. She will see urology the first of the year because she had to reschedule.

## 2013-09-03 ENCOUNTER — Other Ambulatory Visit (HOSPITAL_COMMUNITY): Payer: Self-pay | Admitting: Psychiatry

## 2013-09-03 ENCOUNTER — Other Ambulatory Visit: Payer: Self-pay | Admitting: Family Medicine

## 2013-09-07 HISTORY — PX: CATARACT EXTRACTION W/ INTRAOCULAR LENS IMPLANT: SHX1309

## 2013-09-07 HISTORY — PX: EYE SURGERY: SHX253

## 2013-09-11 ENCOUNTER — Ambulatory Visit: Payer: Medicare Other | Admitting: Nurse Practitioner

## 2013-09-22 ENCOUNTER — Encounter: Payer: Self-pay | Admitting: Family Medicine

## 2013-09-25 ENCOUNTER — Ambulatory Visit: Payer: Self-pay | Admitting: Urology

## 2013-10-05 ENCOUNTER — Other Ambulatory Visit (HOSPITAL_COMMUNITY): Payer: Self-pay | Admitting: Psychiatry

## 2013-10-07 ENCOUNTER — Telehealth: Payer: Self-pay

## 2013-10-07 NOTE — Telephone Encounter (Signed)
noted 

## 2013-10-23 ENCOUNTER — Ambulatory Visit (INDEPENDENT_AMBULATORY_CARE_PROVIDER_SITE_OTHER): Payer: Medicare HMO | Admitting: Urology

## 2013-10-23 DIAGNOSIS — N393 Stress incontinence (female) (male): Secondary | ICD-10-CM

## 2013-10-23 DIAGNOSIS — N3642 Intrinsic sphincter deficiency (ISD): Secondary | ICD-10-CM

## 2013-10-23 DIAGNOSIS — N8111 Cystocele, midline: Secondary | ICD-10-CM

## 2013-10-26 ENCOUNTER — Ambulatory Visit: Payer: Medicare Other | Admitting: Neurology

## 2013-11-02 ENCOUNTER — Other Ambulatory Visit (HOSPITAL_COMMUNITY): Payer: Self-pay | Admitting: Psychiatry

## 2013-11-02 MED ORDER — SERTRALINE HCL 50 MG PO TABS: 50.0000 mg | ORAL_TABLET | Freq: Two times a day (BID) | ORAL | Status: DC

## 2013-11-03 ENCOUNTER — Other Ambulatory Visit: Payer: Self-pay

## 2013-11-03 MED ORDER — BENAZEPRIL HCL 20 MG PO TABS: ORAL_TABLET | ORAL | Status: DC

## 2013-11-03 MED ORDER — OMEPRAZOLE 20 MG PO CPDR: DELAYED_RELEASE_CAPSULE | ORAL | Status: DC

## 2013-11-03 MED ORDER — ATORVASTATIN CALCIUM 40 MG PO TABS: ORAL_TABLET | ORAL | Status: DC

## 2013-11-03 MED ORDER — GABAPENTIN 300 MG PO CAPS: ORAL_CAPSULE | ORAL | Status: DC

## 2013-11-13 ENCOUNTER — Ambulatory Visit (HOSPITAL_COMMUNITY): Payer: Self-pay | Admitting: Psychiatry

## 2013-11-17 ENCOUNTER — Encounter: Payer: Medicare HMO | Admitting: Family Medicine

## 2013-11-18 ENCOUNTER — Encounter (HOSPITAL_COMMUNITY): Payer: Self-pay | Admitting: Psychiatry

## 2013-11-18 ENCOUNTER — Ambulatory Visit (INDEPENDENT_AMBULATORY_CARE_PROVIDER_SITE_OTHER): Payer: Medicare HMO | Admitting: Psychiatry

## 2013-11-18 VITALS — BP 138/68 | Ht 67.0 in | Wt 274.0 lb

## 2013-11-18 DIAGNOSIS — F329 Major depressive disorder, single episode, unspecified: Secondary | ICD-10-CM

## 2013-11-18 DIAGNOSIS — F3289 Other specified depressive episodes: Secondary | ICD-10-CM

## 2013-11-18 MED ORDER — SERTRALINE HCL 50 MG PO TABS: 50.0000 mg | ORAL_TABLET | Freq: Two times a day (BID) | ORAL | Status: DC
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
## 2013-12-03 ENCOUNTER — Other Ambulatory Visit: Payer: Self-pay | Admitting: Family Medicine

## 2013-12-14 ENCOUNTER — Telehealth: Payer: Self-pay | Admitting: Family Medicine

## 2013-12-14 NOTE — Telephone Encounter (Signed)
Ok to refer to podiaterist of her choice/ who also acce[pts her insurance, pls enter I will sign off, let her know

## 2013-12-14 NOTE — Telephone Encounter (Signed)
Please advise.  Referral to what office?

## 2013-12-16 LAB — HM DIABETES EYE EXAM

## 2013-12-29 ENCOUNTER — Other Ambulatory Visit: Payer: Self-pay | Admitting: Family Medicine

## 2013-12-30 NOTE — Telephone Encounter (Signed)
Patient seen by podiatry.

## 2014-01-06 ENCOUNTER — Ambulatory Visit (INDEPENDENT_AMBULATORY_CARE_PROVIDER_SITE_OTHER): Payer: Medicare Other | Admitting: Ophthalmology

## 2014-01-18 ENCOUNTER — Telehealth: Payer: Self-pay

## 2014-01-19 NOTE — Telephone Encounter (Signed)
Patient would like to you speak to him on her behalf.  It is the staff and not the doctor that she is having problems with.

## 2014-01-19 NOTE — Telephone Encounter (Signed)
I suggest she ensures she keeps her appt s with him as scheduled  I wilkl send hiomma flag stating she has concerns re service if she wishes , let me know (NOT his service)

## 2014-01-19 NOTE — Telephone Encounter (Signed)
I will send a flaqg to him and she needs to keep appts

## 2014-01-19 NOTE — Telephone Encounter (Signed)
Patient has complaints about service given at Dr. Liliane Channel office.  Is not happy with staff and wants to know if there is anything that can be done.  Does not want to switch physicians due to transportation.

## 2014-01-26 ENCOUNTER — Other Ambulatory Visit (HOSPITAL_COMMUNITY): Payer: Self-pay | Admitting: Psychiatry

## 2014-01-26 ENCOUNTER — Telehealth (HOSPITAL_COMMUNITY): Payer: Self-pay | Admitting: *Deleted

## 2014-01-26 MED ORDER — SERTRALINE HCL 50 MG PO TABS: 50.0000 mg | ORAL_TABLET | Freq: Two times a day (BID) | ORAL | Status: DC

## 2014-01-26 NOTE — Telephone Encounter (Signed)
Reordered, she will need to come in for med changes

## 2014-01-28 ENCOUNTER — Ambulatory Visit (INDEPENDENT_AMBULATORY_CARE_PROVIDER_SITE_OTHER): Payer: Medicare HMO | Admitting: Family Medicine

## 2014-01-28 ENCOUNTER — Encounter: Payer: Self-pay | Admitting: Family Medicine

## 2014-01-28 ENCOUNTER — Ambulatory Visit (INDEPENDENT_AMBULATORY_CARE_PROVIDER_SITE_OTHER): Payer: Medicare HMO | Admitting: Psychiatry

## 2014-01-28 ENCOUNTER — Other Ambulatory Visit: Payer: Self-pay | Admitting: Family Medicine

## 2014-01-28 ENCOUNTER — Encounter (HOSPITAL_COMMUNITY): Payer: Self-pay | Admitting: Psychiatry

## 2014-01-28 VITALS — BP 120/60 | Ht 67.0 in | Wt 275.0 lb

## 2014-01-28 VITALS — BP 138/72 | HR 84 | Resp 18 | Ht 67.5 in | Wt 278.0 lb

## 2014-01-28 DIAGNOSIS — S90829A Blister (nonthermal), unspecified foot, initial encounter: Secondary | ICD-10-CM

## 2014-01-28 DIAGNOSIS — I10 Essential (primary) hypertension: Secondary | ICD-10-CM

## 2014-01-28 DIAGNOSIS — B369 Superficial mycosis, unspecified: Secondary | ICD-10-CM

## 2014-01-28 DIAGNOSIS — F32A Depression, unspecified: Secondary | ICD-10-CM

## 2014-01-28 DIAGNOSIS — F341 Dysthymic disorder: Secondary | ICD-10-CM

## 2014-01-28 DIAGNOSIS — E1149 Type 2 diabetes mellitus with other diabetic neurological complication: Secondary | ICD-10-CM

## 2014-01-28 DIAGNOSIS — E785 Hyperlipidemia, unspecified: Secondary | ICD-10-CM

## 2014-01-28 DIAGNOSIS — I739 Peripheral vascular disease, unspecified: Secondary | ICD-10-CM

## 2014-01-28 DIAGNOSIS — F3289 Other specified depressive episodes: Secondary | ICD-10-CM

## 2014-01-28 DIAGNOSIS — F329 Major depressive disorder, single episode, unspecified: Secondary | ICD-10-CM

## 2014-01-28 DIAGNOSIS — M1990 Unspecified osteoarthritis, unspecified site: Secondary | ICD-10-CM

## 2014-01-28 DIAGNOSIS — IMO0002 Reserved for concepts with insufficient information to code with codable children: Secondary | ICD-10-CM

## 2014-01-28 DIAGNOSIS — F419 Anxiety disorder, unspecified: Secondary | ICD-10-CM

## 2014-01-28 DIAGNOSIS — R32 Unspecified urinary incontinence: Secondary | ICD-10-CM

## 2014-01-28 HISTORY — DX: Peripheral vascular disease, unspecified: I73.9

## 2014-01-28 MED ORDER — SERTRALINE HCL 100 MG PO TABS: ORAL_TABLET | ORAL | Status: DC

## 2014-01-28 MED ORDER — MUPIROCIN CALCIUM 2 % EX CREA: 1.0000 "application " | TOPICAL_CREAM | Freq: Two times a day (BID) | CUTANEOUS | Status: DC

## 2014-01-28 NOTE — Patient Instructions (Addendum)
Annual wellness in 3.5 month, call if you need me before   You are referred to podiatrist who will follow you   We willl send for recent labs from Dr Luretha Murphy are referred for test of circulation in feet  You need to examine feet regularly since your sensation is not good  Please keep appt for eye exam  Microalb from office today
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
## 2014-01-29 ENCOUNTER — Ambulatory Visit (INDEPENDENT_AMBULATORY_CARE_PROVIDER_SITE_OTHER): Payer: Medicare Other | Admitting: Ophthalmology

## 2014-01-30 LAB — MICROALBUMIN / CREATININE URINE RATIO
Creatinine, Urine: 49.9 mg/dL
Microalb Creat Ratio: 10 mg/g (ref 0.0–30.0)
Microalb, Ur: 0.5 mg/dL (ref 0.00–1.89)

## 2014-01-31 MED ORDER — CLOTRIMAZOLE 1 % EX CREA: 1.0000 "application " | TOPICAL_CREAM | Freq: Two times a day (BID) | CUTANEOUS | Status: DC

## 2014-01-31 NOTE — Assessment & Plan Note (Signed)
Controlled, no change in medication  

## 2014-01-31 NOTE — Assessment & Plan Note (Signed)
Controlled, no change in medication Followed by psychology

## 2014-01-31 NOTE — Assessment & Plan Note (Signed)
Chronic dependency of assistive device for safe mobility.

## 2014-01-31 NOTE — Assessment & Plan Note (Addendum)
decreased pedal pulses with evidence of reduced circulation on skin exam refer for ABI testing. Pt counseled on impportance of close attention to foot care, states she lives in fear of loss of limb

## 2014-01-31 NOTE — Assessment & Plan Note (Signed)
Being treated by urologyu eith some success, no complaints voiced at this visit in this regard

## 2014-01-31 NOTE — Assessment & Plan Note (Signed)
Consistent concern re rigth great toenail bed in particular refer to podiatry, wants to change to a new Doc

## 2014-01-31 NOTE — Assessment & Plan Note (Signed)
Hyperlipidemia:Low fat diet discussed and encouraged.  Updated lab needed at/ before next visit.  

## 2014-01-31 NOTE — Assessment & Plan Note (Signed)
Topical antifungal twice daily

## 2014-01-31 NOTE — Progress Notes (Signed)
   Subjective:    Patient ID: Laura Atkins, female    DOB: 10-06-1940, 73 y.o.   MRN: [id number]  HPI  The PT is here for follow up and re-evaluation of chronic medical conditions, medication management and review of any available recent lab and radiology data.  Preventive health is updated, specifically  Cancer screening and Immunization.   Questions or concerns regarding consultations or procedures which the PT has had in the interim are  Addressed.Reqiuests referral to podiatrist as concerned about a blister ob her toe, and poor circulation n states she wants a a new provider, afraid she may lose a linmb The PT denies any adverse reactions to current medications since the last visit.  Blood sugar control is not as good as in the past and she understands the need to work to address this   Review of Systems See HPI Denies recent fever or chills. Denies sinus pressure, nasal congestion, ear pain or sore throat. Denies chest congestion, productive cough or wheezing. Denies chest pains, palpitations and leg swelling Denies abdominal pain, nausea, vomiting,diarrhea or constipation.   Denies dysuria, frequency, hesitancy or incontinence. Chronic  joint pain,g and limitation in mobility. Denies headaches, seizures, numbness, or tingling. Denies uncontrolled  depression, anxiety or insomnia.       Objective:   Physical Exam BP 138/72  Pulse 84  Resp 18  Ht 5' 7.5" (1.715 m)  Wt 278 lb (126.1 kg)  BMI 42.87 kg/m2  SpO2 95% Patient alert and oriented and in no cardiopulmonary distress.  HEENT: No facial asymmetry, EOMI, no sinus tenderness,  oropharynx pink and moist.  Neck decreased ROM, no jVd, no adenopathy.  Chest: Clear to auscultation bilaterally.  CVS: S1, S2 no murmurs, no S3.  ABD: Soft non tender. Bowel sounds normal.  Ext: No edema  MS: decreased  ROM spine, shoulders, hips and knees.  Skin: blister on right great toenail bed, no obvious drainage , partially  removed toenail. Hyperpigmentation of feet  Psych: Good eye contact, normal affect. Memory intact not anxious or depressed appearing.  CNS: CN 2-12 intact, power,  normal throughout.Dectreased sensation in feet        Assessment & Plan:  HYPERTENSION Controlled, no change in medication   Dermatomycosis of foot Topical antifungal twice daily  HYPERLIPIDEMIA Hyperlipidemia:Low fat diet discussed and encouraged.  Updated lab needed at/ before next visit.   URINARY INCONTINENCE Being treated by urologyu eith some success, no complaints voiced at this visit in this regard  Anxiety and depression Controlled, no change in medication Followed by psychology  Blister of foot without infection Consistent concern re rigth great toenail bed in particular refer to podiatry, wants to change to a new Doc  Type II or unspecified type diabetes mellitus with neurological manifestations, not stated as uncontrolled(250.60) Has deteriorated, needs o be  mpore compliant with diet and medication Patient advised to reduce carb and sweets, commit to regular physical activity, take meds as prescribed, test blood as directed, and attempt to lose weight, to improve blood sugar control. Followed by endo  OSTEOARTHRITIS Chronic dependency of assistive device for safe mobility.   PVD (peripheral vascular disease) decreased pedal pulses with evidence of reduced circulation on skin exam refer for ABI testing. Pt counseled on impportance of close attention to foot care, states she lives in fear of loss of limb

## 2014-01-31 NOTE — Assessment & Plan Note (Signed)
Has deteriorated, needs o be  mpore compliant with diet and medication Patient advised to reduce carb and sweets, commit to regular physical activity, take meds as prescribed, test blood as directed, and attempt to lose weight, to improve blood sugar control. Followed by endo

## 2014-02-01 ENCOUNTER — Telehealth: Payer: Self-pay | Admitting: Family Medicine

## 2014-02-01 ENCOUNTER — Ambulatory Visit (INDEPENDENT_AMBULATORY_CARE_PROVIDER_SITE_OTHER): Payer: Medicare Other | Admitting: Ophthalmology

## 2014-02-01 NOTE — Telephone Encounter (Signed)
Noted.  Please advise if there are any other options.

## 2014-02-01 NOTE — Telephone Encounter (Signed)
pls refer to podiatrist who pt will see who uses her meds, may need to go to [place], spk with her and arrange please

## 2014-02-04 ENCOUNTER — Encounter: Payer: Medicare HMO | Admitting: Family Medicine

## 2014-02-05 ENCOUNTER — Ambulatory Visit (INDEPENDENT_AMBULATORY_CARE_PROVIDER_SITE_OTHER): Payer: Medicare HMO | Admitting: Ophthalmology

## 2014-02-05 DIAGNOSIS — H2510 Age-related nuclear cataract, unspecified eye: Secondary | ICD-10-CM

## 2014-02-05 DIAGNOSIS — E11319 Type 2 diabetes mellitus with unspecified diabetic retinopathy without macular edema: Secondary | ICD-10-CM

## 2014-02-05 DIAGNOSIS — I10 Essential (primary) hypertension: Secondary | ICD-10-CM

## 2014-02-05 DIAGNOSIS — E1165 Type 2 diabetes mellitus with hyperglycemia: Secondary | ICD-10-CM

## 2014-02-05 DIAGNOSIS — H35039 Hypertensive retinopathy, unspecified eye: Secondary | ICD-10-CM

## 2014-02-05 DIAGNOSIS — E1139 Type 2 diabetes mellitus with other diabetic ophthalmic complication: Secondary | ICD-10-CM

## 2014-02-05 DIAGNOSIS — H43819 Vitreous degeneration, unspecified eye: Secondary | ICD-10-CM

## 2014-02-12 ENCOUNTER — Ambulatory Visit (HOSPITAL_COMMUNITY): Payer: Self-pay | Admitting: Psychiatry

## 2014-02-17 ENCOUNTER — Encounter (HOSPITAL_COMMUNITY): Payer: Self-pay

## 2014-02-22 ENCOUNTER — Telehealth: Payer: Self-pay | Admitting: Family Medicine

## 2014-02-22 NOTE — Telephone Encounter (Signed)
Patient is aware 

## 2014-02-24 ENCOUNTER — Encounter (HOSPITAL_COMMUNITY): Payer: Self-pay | Admitting: Psychiatry

## 2014-02-24 ENCOUNTER — Ambulatory Visit (INDEPENDENT_AMBULATORY_CARE_PROVIDER_SITE_OTHER): Payer: Medicare HMO | Admitting: Psychiatry

## 2014-02-24 VITALS — BP 130/60 | Ht 67.0 in | Wt 280.0 lb

## 2014-02-24 DIAGNOSIS — F3289 Other specified depressive episodes: Secondary | ICD-10-CM

## 2014-02-24 DIAGNOSIS — F329 Major depressive disorder, single episode, unspecified: Secondary | ICD-10-CM

## 2014-02-24 MED ORDER — DULOXETINE HCL 60 MG PO CPEP: 60.0000 mg | ORAL_CAPSULE | Freq: Every day | ORAL | Status: DC
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
## 2014-02-25 ENCOUNTER — Other Ambulatory Visit: Payer: Self-pay | Admitting: Family Medicine

## 2014-03-01 ENCOUNTER — Telehealth (HOSPITAL_COMMUNITY): Payer: Self-pay | Admitting: *Deleted

## 2014-03-01 ENCOUNTER — Other Ambulatory Visit (HOSPITAL_COMMUNITY): Payer: Self-pay | Admitting: Psychiatry

## 2014-03-01 MED ORDER — FLUOXETINE HCL 20 MG PO CAPS: 20.0000 mg | ORAL_CAPSULE | Freq: Every day | ORAL | Status: DC

## 2014-03-01 NOTE — Telephone Encounter (Signed)
Prozac 20 mg sent in

## 2014-03-04 ENCOUNTER — Telehealth (HOSPITAL_COMMUNITY): Payer: Self-pay | Admitting: *Deleted

## 2014-03-04 ENCOUNTER — Other Ambulatory Visit: Payer: Self-pay

## 2014-03-04 MED ORDER — FUROSEMIDE 40 MG PO TABS: ORAL_TABLET | ORAL | Status: DC

## 2014-03-04 MED ORDER — POTASSIUM CHLORIDE CRYS ER 20 MEQ PO TBCR: EXTENDED_RELEASE_TABLET | ORAL | Status: DC

## 2014-03-04 MED ORDER — BENAZEPRIL HCL 20 MG PO TABS: ORAL_TABLET | ORAL | Status: DC

## 2014-03-04 MED ORDER — OMEPRAZOLE 20 MG PO CPDR: DELAYED_RELEASE_CAPSULE | ORAL | Status: DC

## 2014-03-04 MED ORDER — GABAPENTIN 300 MG PO CAPS: ORAL_CAPSULE | ORAL | Status: DC

## 2014-03-04 MED ORDER — AMLODIPINE BESYLATE 10 MG PO TABS: ORAL_TABLET | ORAL | Status: DC

## 2014-03-11 ENCOUNTER — Ambulatory Visit (HOSPITAL_COMMUNITY): Payer: Self-pay | Admitting: Psychiatry

## 2014-03-24 ENCOUNTER — Telehealth: Payer: Self-pay | Admitting: Family Medicine

## 2014-03-24 ENCOUNTER — Encounter (HOSPITAL_COMMUNITY): Payer: Self-pay | Admitting: Psychiatry

## 2014-03-24 ENCOUNTER — Ambulatory Visit (INDEPENDENT_AMBULATORY_CARE_PROVIDER_SITE_OTHER): Payer: Medicare HMO | Admitting: Psychiatry

## 2014-03-24 VITALS — BP 140/62 | Ht 67.0 in | Wt 278.0 lb

## 2014-03-24 DIAGNOSIS — Z794 Long term (current) use of insulin: Principal | ICD-10-CM

## 2014-03-24 DIAGNOSIS — E1165 Type 2 diabetes mellitus with hyperglycemia: Principal | ICD-10-CM

## 2014-03-24 DIAGNOSIS — IMO0001 Reserved for inherently not codable concepts without codable children: Secondary | ICD-10-CM

## 2014-03-24 DIAGNOSIS — F3289 Other specified depressive episodes: Secondary | ICD-10-CM

## 2014-03-24 DIAGNOSIS — F329 Major depressive disorder, single episode, unspecified: Secondary | ICD-10-CM

## 2014-03-24 MED ORDER — FLUOXETINE HCL 20 MG PO CAPS: 20.0000 mg | ORAL_CAPSULE | Freq: Two times a day (BID) | ORAL | Status: DC
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
## 2014-03-24 NOTE — Telephone Encounter (Signed)
pls call and make an appt for pt to be treated in [place] per her request, due to  difficvulties she is experiencing in [place], she is aware that we are getting this done, ls call and  give her an appt , she is expecting the call, pl send a copy of Dr Liliane Channel last note with the referral

## 2014-04-06 ENCOUNTER — Ambulatory Visit (INDEPENDENT_AMBULATORY_CARE_PROVIDER_SITE_OTHER): Payer: Medicare HMO | Admitting: Psychiatry

## 2014-04-06 DIAGNOSIS — F3289 Other specified depressive episodes: Secondary | ICD-10-CM

## 2014-04-06 DIAGNOSIS — F329 Major depressive disorder, single episode, unspecified: Secondary | ICD-10-CM

## 2014-04-06 NOTE — Patient Instructions (Signed)
Discussed orally 
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
## 2014-04-07 ENCOUNTER — Telehealth (HOSPITAL_COMMUNITY): Payer: Self-pay | Admitting: *Deleted

## 2014-04-08 NOTE — Telephone Encounter (Signed)
Pt needed reassurance

## 2014-04-09 ENCOUNTER — Ambulatory Visit (INDEPENDENT_AMBULATORY_CARE_PROVIDER_SITE_OTHER): Payer: Medicare HMO | Admitting: Endocrinology

## 2014-04-09 ENCOUNTER — Encounter: Payer: Self-pay | Admitting: Endocrinology

## 2014-04-09 VITALS — BP 122/60 | HR 77 | Temp 98.8°F | Ht 67.0 in | Wt 276.0 lb

## 2014-04-09 DIAGNOSIS — E1149 Type 2 diabetes mellitus with other diabetic neurological complication: Secondary | ICD-10-CM

## 2014-04-09 LAB — HEMOGLOBIN A1C: Hemoglobin-A1c: 8.7

## 2014-04-09 MED ORDER — INSULIN NPH (HUMAN) (ISOPHANE) 100 UNIT/ML ~~LOC~~ SUSP: 30.0000 [IU] | Freq: Every day | SUBCUTANEOUS | Status: DC

## 2014-04-09 MED ORDER — INSULIN REGULAR HUMAN 100 UNIT/ML IJ SOLN: 15.0000 [IU] | Freq: Three times a day (TID) | INTRAMUSCULAR | Status: DC

## 2014-04-09 NOTE — Patient Instructions (Addendum)
good diet and exercise habits significanly improve the control of your diabetes.  please let me know if you wish to be referred to a dietician.  high blood sugar is very risky to your health.  you should see an eye doctor and dentist every year.  You are at higher than average risk for pneumonia and hepatitis-B.  You should be vaccinated against both.   controlling your blood pressure and cholesterol drastically reduces the damage diabetes does to your body.  this also applies to quitting smoking.  please discuss these with your doctor.  check your blood sugar 4 times a [id number] the 3 meals, and at bedtime.  also check if you have symptoms of your blood sugar being too high or too low.  please keep a record of the readings and bring it to your next appointment here.  You can write it on any piece of paper.  please call us sooner if your blood sugar goes below 70, or if you have a lot of readings over 200. You can stop the invokana, to simplify your medications.  Please change nighttime insulin to NPH ([employer]) 30 units at bedtime. Please change novolog to regular ([employer]) 15 units 3 times a day (just before each meal). Please come back for a follow-up appointment in 1 month.

## 2014-04-09 NOTE — Progress Notes (Signed)
Subjective:    Patient ID: Laura Atkins, female    DOB: 29-Sep-1940, 73 y.o.   MRN: [id number]  HPI pt states DM was dx'ed in 2000; she has moderate neuropathy of the lower extremities, she has associated PAD and foot ulcer; she has been on insulin since 2005; pt says her diet and exercise are not good; she has never had GDM, pancreatitis, severe hypoglycemia or DKA.  she brings an extensive record of her cbg's which i have reviewed today. Past Medical History  Diagnosis Date  . Hypertension   . Diabetes mellitus   . Hyperlipidemia   . Obesity   . Anemia   . Anxiety and depression   . GERD (gastroesophageal reflux disease)   . Pruritus   . Osteoarthritis     Left knee; right shoulder; chronic neck and back pain  . Obstructive sleep apnea   . Urinary incontinence   . Tremor     This started months ago after her seizure progressing to very poor hand writing  . Seizures   . Depression   . Anxiety   . [id number]     Past Surgical History  Procedure Laterality Date  . Abdominal hysterectomy    . Breast excisional biopsy      Left; cyst  . Cholecystectomy    . Cataract extraction w/ intraocular lens implant Left 09/07/2013    History   Social History  . Marital Status: Married    Spouse Name: Laura Atkins    Number of Children: 5  . Years of Education: 12   Occupational History  . retired     retired   Social History Main Topics  . Smoking status: Never Smoker   . Smokeless tobacco: Never Used  . Alcohol Use: No  . Drug Use: No  . Sexual Activity: Yes    Birth Control/ Protection: Surgical   Other Topics Concern  . Not on file   Social History Narrative   Patient lives at home with her husband Laura Atkins).  Patient is retired.    Right handed.    Five Children.    Caffeine- 2 daily    Current Outpatient Prescriptions on File Prior to Visit  Medication Sig Dispense Refill  . acetaminophen (EXTRA STRENGTH PAIN RELIEF) 500 MG tablet Take 500 mg by mouth  every 6 (six) hours as needed for pain.      Marland Kitchen amLODipine (NORVASC) 10 MG tablet TAKE 1 TABLET BY MOUTH EVERY DAY  90 tablet  0  . aspirin 81 MG tablet Take 81 mg by mouth daily.        Marland Kitchen atorvastatin (LIPITOR) 40 MG tablet TAKE 1 TABLET BY MOUTH DAILY  30 tablet  3  . B-D INS SYR ULTRAFINE 1CC/30G 30G X 1/2" 1 ML MISC USE AS DIRECTED  100 each  2  . benazepril (LOTENSIN) 20 MG tablet TAKE ONE TABLET BY MOUTH EVERY DAY  90 tablet  0  . clotrimazole (LOTRIMIN) 1 % cream Apply 1 application topically 2 (two) times daily.  30 g  0  . diclofenac sodium (VOLTAREN) 1 % GEL Apply 4 g topically 4 (four) times daily.  1 Tube  12  . diphenhydrAMINE (BENADRYL) 25 MG tablet Take 25 mg by mouth every 6 (six) hours as needed for itching.      Marland Kitchen FLUoxetine (PROZAC) 20 MG capsule Take 1 capsule (20 mg total) by mouth 2 (two) times daily.  60 capsule  2  . furosemide (LASIX) 40 MG  tablet TAKE 1 TABLET BY MOUTH TWICE DAILY  90 tablet  0  . gabapentin (NEURONTIN) 300 MG capsule TAKE 1 CAPSULE BY MOUTH THREE TIMES DAILY  90 capsule  0  . Glucose Blood (FREESTYLE LITE TEST VI)       . glucose blood (ONE TOUCH ULTRA TEST) test strip Three times daily testing  DX 250.00  100 each  11  . multivitamin (THERAGRAN) per tablet Take 1 tablet by mouth daily.        . mupirocin cream (BACTROBAN) 2 % Apply 1 application topically 2 (two) times daily. Apply to affected naily twice daily for 1 week , then as needed  30 g  0  . omeprazole (PRILOSEC) 20 MG capsule TAKE 1 CAPSULE BY MOUTH DAILY  90 capsule  0  . potassium chloride SA (K-DUR,KLOR-CON) 20 MEQ tablet TAKE 1 TABLET BY MOUTH EVERY DAY  90 tablet  0   No current facility-administered medications on file prior to visit.    Allergies  Allergen Reactions  . Penicillins Shortness Of Breath, Itching and Rash  . Prednisone Shortness Of Breath, Itching and Rash  . Propoxyphene N-Acetaminophen Itching and Nausea And Vomiting    Family History  Problem Relation Age of  Onset  . ADD / ADHD Grandchild   . Bipolar disorder Grandchild   . Bipolar disorder Daughter   . Alcohol abuse Neg Hx   . Drug abuse Neg Hx   . Diabetes Sister     BP 122/60  Pulse 77  Temp(Src) 98.8 F (37.1 C) (Oral)  Ht 5\' 7"  (1.702 m)  Wt 276 lb (125.193 kg)  BMI 43.22 kg/m2  SpO2 97%  Review of Systems denies weight loss, blurry vision, chest pain, sob, n/v, urinary frequency, excessive diaphoresis, and rhinorrhea.  She has headache, leg cramps, cold intolerance, easy bruising, memory loss, and depression.      Objective:   Physical Exam VS: see vs page GEN: no distress.  Morbid obesity.  In wheelchair. HEAD: head: no deformity eyes: no periorbital swelling, no proptosis external nose and ears are normal mouth: no lesion seen NECK: supple, thyroid is not enlarged CHEST WALL: no deformity LUNGS:  Clear to auscultation CV: reg rate and rhythm, no murmur ABD: abdomen is soft, nontender.  no hepatosplenomegaly.  not distended.  no hernia MUSCULOSKELETAL: muscle bulk and strength are grossly normal.  no obvious joint swelling.  gait is normal and steady EXTEMITIES: no deformity.  no ulcer on the feet.  feet are of normal color and temp.  Trace bilat leg edema.  Several healing blisters on the left foot.  Both great toenails are partially resected.  PULSES: dorsalis pedis intact bilat.  no carotid bruit NEURO:  cn 2-12 grossly intact.   readily moves all 4's.  sensation is intact to touch on the feet, but decreased from normal.   SKIN:  Normal texture and temperature.  No rash or suspicious lesion is visible.   NODES:  None palpable at the neck PSYCH: alert, well-oriented.  Does not appear anxious nor depressed.    i reviewed electrocardiogram Lab Results  Component Value Date   HGBA1C 8.7 01/20/2014   i have reviewed the following outside records: Office notes    Assessment & Plan:  DM: moderate exacerbation Morbid obesity, new: this vastly complicates the rx of DM.   Exercise rx is complicated by gait problems.  I'll work around this as best I can. Depression: this can also complicate the rx of DM.  However, we discussed, and pt says this is well-controlled.   Patient is advised the following: Patient Instructions  good diet and exercise habits significanly improve the control of your diabetes.  please let me know if you wish to be referred to a dietician.  high blood sugar is very risky to your health.  you should see an eye doctor and dentist every year.  You are at higher than average risk for pneumonia and hepatitis-B.  You should be vaccinated against both.   controlling your blood pressure and cholesterol drastically reduces the damage diabetes does to your body.  this also applies to quitting smoking.  please discuss these with your doctor.  check your blood sugar 4 times a [id number] the 3 meals, and at bedtime.  also check if you have symptoms of your blood sugar being too high or too low.  please keep a record of the readings and bring it to your next appointment here.  You can write it on any piece of paper.  please call us sooner if your blood sugar goes below 70, or if you have a lot of readings over 200. You can stop the invokana, to simplify your medications.  Please change nighttime insulin to NPH ([employer]) 30 units at bedtime. Please change novolog to regular ([employer]) 15 units 3 times a day (just before each meal). Please come back for a follow-up appointment in 1 month.

## 2014-04-15 ENCOUNTER — Telehealth: Payer: Self-pay | Admitting: Family Medicine

## 2014-04-15 NOTE — Telephone Encounter (Signed)
Patient aware.

## 2014-04-21 ENCOUNTER — Telehealth: Payer: Self-pay | Admitting: Family Medicine

## 2014-04-21 DIAGNOSIS — E785 Hyperlipidemia, unspecified: Secondary | ICD-10-CM

## 2014-04-21 NOTE — Telephone Encounter (Signed)
Ordered a lipid panel and pt aware to go and have this done before her next appt

## 2014-04-22 LAB — LIPID PANEL
Cholesterol: 131 mg/dL (ref 0–200)
HDL: 56 mg/dL (ref >39)
LDL Cholesterol: 59 mg/dL (ref 0–99)
Total CHOL/HDL Ratio: 2.3 Ratio
Triglycerides: 80 mg/dL (ref <150)
VLDL: 16 mg/dL (ref 0–40)

## 2014-04-26 ENCOUNTER — Ambulatory Visit (HOSPITAL_COMMUNITY): Payer: Self-pay | Admitting: Psychiatry

## 2014-04-26 ENCOUNTER — Other Ambulatory Visit: Payer: Self-pay | Admitting: Family Medicine

## 2014-05-03 ENCOUNTER — Ambulatory Visit (INDEPENDENT_AMBULATORY_CARE_PROVIDER_SITE_OTHER): Payer: Medicare HMO | Admitting: Family Medicine

## 2014-05-03 ENCOUNTER — Encounter: Payer: Self-pay | Admitting: Family Medicine

## 2014-05-03 VITALS — BP 134/74 | HR 85 | Resp 16 | Ht 67.5 in | Wt 281.8 lb

## 2014-05-03 DIAGNOSIS — Z0000 Encounter for general adult medical examination without abnormal findings: Secondary | ICD-10-CM | POA: Insufficient documentation

## 2014-05-03 DIAGNOSIS — Z23 Encounter for immunization: Secondary | ICD-10-CM

## 2014-05-03 DIAGNOSIS — I10 Essential (primary) hypertension: Secondary | ICD-10-CM

## 2014-05-03 DIAGNOSIS — E1149 Type 2 diabetes mellitus with other diabetic neurological complication: Secondary | ICD-10-CM

## 2014-05-03 DIAGNOSIS — E785 Hyperlipidemia, unspecified: Secondary | ICD-10-CM

## 2014-05-03 NOTE — Progress Notes (Signed)
Subjective:    Patient ID: Laura Atkins, female    DOB: June 15, 1941, 73 y.o.   MRN: [id number]  HPI Preventive Screening-Counseling & Management   Patient present here today for a Medicare annual wellness visit.   Current Problems (verified)   Medications Prior to Visit Allergies (verified)   PAST HISTORY  Family History (verified)   Social History Retired from [employer], married 62 years with 5 kids . No alcohol, nicotine or drug use, 2nd hand  smoke exposure uop to 25 years ago   Risk Factors  Current exercise habits:  Daily walking for 30 minutes   Dietary issues discussed: Heart healthy, low carb, including more fruits and vegetables in diet    Cardiac risk factors: DM   Depression Screen  (Note: if answer to either of the following is "Yes", a more complete depression screening is indicated)   Over the past two weeks, have you felt down, depressed or hopeless? yes Over the past two weeks, have you felt little interest or pleasure in doing things? A little  Have you lost interest or pleasure in daily life? No  Do you often feel hopeless? No  Do you cry easily over simple problems? No   Activities of Daily Living  In your present state of health, do you have any difficulty performing the following activities?  Driving?: doesn't drive  Managing money?: yes, needs help from spouse and her grandson Feeding yourself?:No Getting from bed to chair?:No Climbing a flight of stairs? Avoids stairs but can climb them if needed  Preparing food and eating?:No Bathing or showering?:No Getting dressed?:No Getting to the toilet?:No Using the toilet?:No Moving around from place to place?: uses walker to get around  Fall Risk Assessment In the past year have you fallen or had a near fall?: yes, feel at home and was not injured , fell while putting on her sweater during Winter of this year Are you currently taking any medications that make you dizzy?:No   Hearing  Difficulties: No Do you often ask people to speak up or repeat themselves?: sometimes  Do you experience ringing or noises in your ears?:No Do you have difficulty understanding soft or whispered voices?:No  Cognitive Testing  Alert? Yes Normal Appearance?Yes  Oriented to person? Yes Place? Yes  Time? Yes  Displays appropriate judgment?Yes  Can read the correct time from a watch face? yes Are you having problems remembering things?No  Advanced Directives have been discussed with the patient?Yes, brochure given , full code   List the Names of Other Physician/Practitioners you currently use:  Dr Loanne Drilling (Endo) Dr Levonne Spiller (Psych)   Indicate any recent Medical Services you may have received from other than [facility] providers in the past year (date may be approximate).   Assessment:    Annual Wellness Exam   Plan:      Medicare Attestation  I have personally reviewed:  The patient's medical and social history  Their use of alcohol, tobacco or illicit drugs  Their current medications and supplements  The patient's functional ability including ADLs,fall risks, home safety risks, cognitive, and hearing and visual impairment  Diet and physical activities  Evidence for depression or mood disorders  The patient's weight, height, BMI, and visual acuity have been recorded in the chart. I have made referrals, counseling, and provided education to the patient based on review of the above and I have provided the patient with a written personalized care plan for preventive services.  Review of Systems     Objective:   Physical Exam        Assessment & Plan:  Medicare annual wellness visit, subsequent Annual exam as documented. Counseling done  re healthy lifestyle involving commitment to 150 minutes exercise per week, heart healthy diet, and attaining healthy weight.The importance of adequate sleep also discussed. Regular seat belt use and safe storage  of firearms if  patient has them, is also discussed. Changes in health habits are decided on by the patient with goals and time frames  set for achieving them. Immunization and cancer screening needs are specifically addressed at this visit.   Need for vaccination with 13-polyvalent pneumococcal conjugate vaccine Vaccine administered

## 2014-05-03 NOTE — Addendum Note (Signed)
Addended by: Eual Fines on: 05/03/2014 01:01 PM   Modules accepted: Orders

## 2014-05-03 NOTE — Assessment & Plan Note (Signed)
Annual exam as documented. Counseling done  re healthy lifestyle involving commitment to 150 minutes exercise per week, heart healthy diet, and attaining healthy weight.The importance of adequate sleep also discussed. Regular seat belt use and safe storage  of firearms if patient has them, is also discussed. Changes in health habits are decided on by the patient with goals and time frames  set for achieving them. Immunization and cancer screening needs are specifically addressed at this visit.  

## 2014-05-03 NOTE — Assessment & Plan Note (Signed)
Vaccine administered.

## 2014-05-03 NOTE — Patient Instructions (Signed)
F/u in December, call if you need me before  Pls sign for recent eye exam and foot exam  Labs today will be forwarded to Dr Loanne Drilling who you see next week  CBC, cmp and EGFr, tSH, HBA1C  You need to use your CPAP machine to improveoverall health and you energy level  Please be careful about home safety so you do not fall  Call in a few weeks for your flu vaccine

## 2014-05-05 ENCOUNTER — Ambulatory Visit (INDEPENDENT_AMBULATORY_CARE_PROVIDER_SITE_OTHER): Payer: Medicare HMO | Admitting: Psychiatry

## 2014-05-05 ENCOUNTER — Encounter (HOSPITAL_COMMUNITY): Payer: Self-pay | Admitting: Psychiatry

## 2014-05-05 VITALS — BP 130/60 | Ht 67.0 in | Wt 282.0 lb

## 2014-05-05 DIAGNOSIS — F3289 Other specified depressive episodes: Secondary | ICD-10-CM

## 2014-05-05 DIAGNOSIS — F329 Major depressive disorder, single episode, unspecified: Secondary | ICD-10-CM
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
## 2014-05-10 ENCOUNTER — Encounter: Payer: Self-pay | Admitting: Endocrinology

## 2014-05-10 ENCOUNTER — Ambulatory Visit (INDEPENDENT_AMBULATORY_CARE_PROVIDER_SITE_OTHER): Payer: Medicare HMO | Admitting: Endocrinology

## 2014-05-10 VITALS — BP 120/72 | HR 80 | Temp 98.2°F | Ht 67.0 in | Wt 282.0 lb

## 2014-05-10 DIAGNOSIS — E1149 Type 2 diabetes mellitus with other diabetic neurological complication: Secondary | ICD-10-CM

## 2014-05-10 LAB — HEMOGLOBIN A1C: Hgb A1c MFr Bld: 8.6 % — ABNORMAL HIGH (ref 4.6–6.5)

## 2014-05-10 NOTE — Progress Notes (Signed)
Subjective:    Patient ID: Laura Atkins, female    DOB: 02/12/1941, 73 y.o.   MRN: [id number]  HPI pt returns for f/u of insulin-requiring DM (dx'ed 2000; she has moderate neuropathy of the lower extremities, she has associated PAD and foot ulcer; she has been on insulin since 2005; pt says her diet and exercise are not good; she has never had GDM, pancreatitis, severe hypoglycemia or DKA).  she brings an extensive record of her cbg's which i have reviewed today.  It varies from 90-200, but most are in the low-100's.  She is still using up her supply of novolog and levemir.  pt states she feels well in general. Past Medical History  Diagnosis Date  . Hypertension   . Diabetes mellitus   . Hyperlipidemia   . Obesity   . Anemia   . Anxiety and depression   . GERD (gastroesophageal reflux disease)   . Pruritus   . Osteoarthritis     Left knee; right shoulder; chronic neck and back pain  . Obstructive sleep apnea   . Urinary incontinence   . Tremor     This started months ago after her seizure progressing to very poor hand writing  . Seizures   . Depression   . Anxiety   . [id number])     Past Surgical History  Procedure Laterality Date  . Abdominal hysterectomy    . Breast excisional biopsy      Left; cyst  . Cholecystectomy    . Cataract extraction w/ intraocular lens implant Left 09/07/2013  . Eye surgery Left 09/07/2013    cataract    History   Social History  . Marital Status: Married    Spouse Name: Laura Atkins    Number of Children: 5  . Years of Education: 12   Occupational History  . retired     retired   Social History Main Topics  . Smoking status: Never Smoker   . Smokeless tobacco: Never Used  . Alcohol Use: No  . Drug Use: No  . Sexual Activity: Yes    Birth Control/ Protection: Surgical   Other Topics Concern  . Not on file   Social History Narrative   Patient lives at home with her husband Laura Atkins).  Patient is retired.    Right  handed.    Five Children.    Caffeine- 2 daily    Current Outpatient Prescriptions on File Prior to Visit  Medication Sig Dispense Refill  . acetaminophen (EXTRA STRENGTH PAIN RELIEF) 500 MG tablet Take 500 mg by mouth every 6 (six) hours as needed for pain.      Marland Kitchen amLODipine (NORVASC) 10 MG tablet TAKE 1 TABLET BY MOUTH EVERY DAY  90 tablet  0  . aspirin 81 MG tablet Take 81 mg by mouth daily.        Marland Kitchen atorvastatin (LIPITOR) 40 MG tablet TAKE 1 TABLET BY MOUTH DAILY  30 tablet  3  . B-D INS SYR ULTRAFINE 1CC/30G 30G X 1/2" 1 ML MISC USE AS DIRECTED  100 each  2  . benazepril (LOTENSIN) 20 MG tablet TAKE ONE TABLET BY MOUTH EVERY DAY  90 tablet  0  . clotrimazole (LOTRIMIN) 1 % cream Apply 1 application topically 2 (two) times daily.  30 g  0  . diclofenac sodium (VOLTAREN) 1 % GEL Apply 4 g topically 4 (four) times daily.  1 Tube  12  . diphenhydrAMINE (BENADRYL) 25 MG tablet Take 25 mg by  mouth every 6 (six) hours as needed for itching.      Marland Kitchen FLUoxetine (PROZAC) 20 MG capsule Take 1 capsule (20 mg total) by mouth 2 (two) times daily.  60 capsule  2  . furosemide (LASIX) 40 MG tablet TAKE 1 TABLET BY MOUTH TWICE DAILY  180 tablet  0  . gabapentin (NEURONTIN) 300 MG capsule TAKE 1 CAPSULE BY MOUTH THREE TIMES DAILY  270 capsule  0  . Glucose Blood (FREESTYLE LITE TEST VI)       . glucose blood (ONE TOUCH ULTRA TEST) test strip Three times daily testing  DX 250.00  100 each  11  . insulin NPH Human (HUMULIN N) 100 UNIT/ML injection Inject 0.3 mLs (30 Units total) into the skin at bedtime.  10 mL  11  . insulin regular (HUMULIN R) 100 units/mL injection Inject 0.15 mLs (15 Units total) into the skin 3 (three) times daily before meals.  20 mL  11  . multivitamin (THERAGRAN) per tablet Take 1 tablet by mouth daily.        . mupirocin cream (BACTROBAN) 2 % Apply 1 application topically 2 (two) times daily. Apply to affected naily twice daily for 1 week , then as needed  30 g  0  . omeprazole  (PRILOSEC) 20 MG capsule TAKE 1 CAPSULE BY MOUTH DAILY  90 capsule  0  . potassium chloride SA (K-DUR,KLOR-CON) 20 MEQ tablet TAKE 1 TABLET BY MOUTH EVERY DAY  90 tablet  0   No current facility-administered medications on file prior to visit.    Allergies  Allergen Reactions  . Penicillins Shortness Of Breath, Itching and Rash  . Prednisone Shortness Of Breath, Itching and Rash  . Propoxyphene N-Acetaminophen Itching and Nausea And Vomiting    Family History  Problem Relation Age of Onset  . ADD / ADHD Grandchild   . Bipolar disorder Grandchild   . Bipolar disorder Daughter   . Alcohol abuse Neg Hx   . Drug abuse Neg Hx   . Cancer Mother 46    lung   . Kidney disease Father   . Diabetes Sister     BP 120/72  Pulse 80  Temp(Src) 98.2 F (36.8 C) (Oral)  Ht 5\' 7"  (1.702 m)  Wt 282 lb (127.914 kg)  BMI 44.16 kg/m2  SpO2 97%  Review of Systems She denies hypoglycemia and weight change.     Objective:   Physical Exam Pulses: dorsalis pedis intact bilat.   Feet: no deformity. normal color and temp.  1+ bilat leg edema.  There are several healed ulcers on the right foot.   Skin:  no ulcer on the feet.   Neuro: sensation is intact to touch on the feet, but decreased from normal.    Lab Results  Component Value Date   HGBA1C 8.6* 05/10/2014      Assessment & Plan:  DM: moderate exacerbation.  Worse. Morbid obesity: not improved.  diet is advised.   Patient is advised the following: Patient Instructions  check your blood sugar 4 times a [id number] the 3 meals, and at bedtime.  also check if you have symptoms of your blood sugar being too high or too low.  please keep a record of the readings and bring it to your next appointment here.  You can write it on any piece of paper.  please call us sooner if your blood sugar goes below 70, or if you have a lot of readings over 200. Please change  nighttime insulin to NPH ([employer]) 30 units at bedtime. Please change novolog to  regular ([employer]) 15 units 3 times a day (just before each meal). Please come back for a follow-up appointment in 1 month.  blood tests are being requested for you today.  We'll contact you with results.

## 2014-05-10 NOTE — Patient Instructions (Addendum)
check your blood sugar 4 times a [id number] the 3 meals, and at bedtime.  also check if you have symptoms of your blood sugar being too high or too low.  please keep a record of the readings and bring it to your next appointment here.  You can write it on any piece of paper.  please call us sooner if your blood sugar goes below 70, or if you have a lot of readings over 200. Please change nighttime insulin to NPH ([employer]) 30 units at bedtime. Please change novolog to regular ([employer]) 15 units 3 times a day (just before each meal). Please come back for a follow-up appointment in 1 month.  blood tests are being requested for you today.  We'll contact you with results.

## 2014-05-12 ENCOUNTER — Ambulatory Visit (INDEPENDENT_AMBULATORY_CARE_PROVIDER_SITE_OTHER): Payer: Medicare HMO | Admitting: Psychiatry

## 2014-05-12 DIAGNOSIS — F329 Major depressive disorder, single episode, unspecified: Secondary | ICD-10-CM

## 2014-05-12 DIAGNOSIS — F3289 Other specified depressive episodes: Secondary | ICD-10-CM

## 2014-05-12 NOTE — Patient Instructions (Signed)
Discussed orally 

## 2014-05-12 NOTE — Progress Notes (Signed)
   THERAPIST PROGRESS NOTE  Session Time: Wednesday 05/12/2014 11:05 AM - 12:00 PM  Participation Level: Active  Behavioral Response: CasualAlertAnxious and Depressed  Type of Therapy: Individual Therapy  Treatment Goals addressed:  Improve ability to manage stress and anxiety  Interventions: Supportive. CBT  Summary: Laura Atkins is a 73 y.o. female who presents with  a long-standing history of depression. She was referred to this practice for continuity of care as she was seen at [facility] but was having difficulty scheduling a time to see a therapist. She continues to experience depressed mood and excessive worry.  Since last session, patient reports increased stress and depressed mood. She reports increased thoughts about sad and painful memories from childhood. She continues to express frustration with husband as well as with her daughter who recently moved back home but is not contributing to the household financially. Patient reports she has trust issues and states feeling like she can't really trust anyone,.  Suicidal/Homicidal: No  Therapist Response: Therapist works with patient to identify and process feelings, identify effects of childhood on current functioning, explore thought patterns and effects on mood and behavior.  Plan: Return again in 2 weeks.  Diagnosis: Axis I: Depressive Disorder NOS    Axis II: Deferred    Phung Kotas, LCSW 05/12/2014

## 2014-05-21 ENCOUNTER — Telehealth: Payer: Self-pay

## 2014-05-21 DIAGNOSIS — E1149 Type 2 diabetes mellitus with other diabetic neurological complication: Secondary | ICD-10-CM

## 2014-05-21 DIAGNOSIS — R8290 Unspecified abnormal findings in urine: Secondary | ICD-10-CM

## 2014-05-21 NOTE — Telephone Encounter (Signed)
Patient in to the office complaning of urine having foul odor.  Was instructed to contact this office by nephrologist.  Urine culture ordered and patient sent to the lab.

## 2014-05-24 ENCOUNTER — Telehealth: Payer: Self-pay | Admitting: Endocrinology

## 2014-05-24 ENCOUNTER — Other Ambulatory Visit: Payer: Self-pay | Admitting: Family Medicine

## 2014-05-24 LAB — URINE CULTURE: Colony Count: >=100000

## 2014-05-24 MED ORDER — LEVOFLOXACIN 500 MG PO TABS: 500.0000 mg | ORAL_TABLET | Freq: Every day | ORAL | Status: DC

## 2014-05-24 NOTE — Telephone Encounter (Addendum)
Called pharmacy. Insurance is no longer covering Humulin N preferred is Novlin N.  Ok to change? Thanks!

## 2014-05-24 NOTE — Telephone Encounter (Signed)
Unable to reach pharmacy to discuss. Will try again at a later time.

## 2014-05-24 NOTE — Telephone Encounter (Signed)
ok 

## 2014-05-24 NOTE — Telephone Encounter (Signed)
Please call pharm regarding humulin N there are questions

## 2014-05-25 ENCOUNTER — Telehealth: Payer: Self-pay | Admitting: Endocrinology

## 2014-05-25 ENCOUNTER — Other Ambulatory Visit: Payer: Self-pay

## 2014-05-25 MED ORDER — INSULIN REGULAR HUMAN 100 UNIT/ML IJ SOLN: INTRAMUSCULAR | Status: DC

## 2014-05-25 MED ORDER — INSULIN NPH (HUMAN) (ISOPHANE) 100 UNIT/ML ~~LOC~~ SUSP: SUBCUTANEOUS | Status: DC

## 2014-05-25 NOTE — Addendum Note (Signed)
Addended by: Moody Bruins E on: 05/25/2014 08:28 AM   Modules accepted: Orders, Medications

## 2014-05-25 NOTE — Telephone Encounter (Signed)
Orders sent electronically.

## 2014-05-25 NOTE — Telephone Encounter (Signed)
Jennifer from [employer], stated that patients insurance wouldn't pay for Humulin  but for the insulin NPH Human (HUMULIN N,NOVOLIN N) 100 UNIT/ML injection.

## 2014-05-25 NOTE — Telephone Encounter (Signed)
Medication sent to pts pharmacy 

## 2014-06-02 ENCOUNTER — Other Ambulatory Visit: Payer: Self-pay

## 2014-06-02 MED ORDER — GABAPENTIN 300 MG PO CAPS: ORAL_CAPSULE | ORAL | Status: DC

## 2014-06-04 ENCOUNTER — Ambulatory Visit (INDEPENDENT_AMBULATORY_CARE_PROVIDER_SITE_OTHER): Payer: Medicare HMO | Admitting: Psychiatry

## 2014-06-04 DIAGNOSIS — F3289 Other specified depressive episodes: Secondary | ICD-10-CM

## 2014-06-04 DIAGNOSIS — F329 Major depressive disorder, single episode, unspecified: Secondary | ICD-10-CM

## 2014-06-04 NOTE — Progress Notes (Signed)
   THERAPIST PROGRESS NOTE  Session Time: Friday 06/04/2014 11:15 AM - 12:00 PM  Participation Level: Active  Behavioral Response: CasualAlert/ less anxious/less depressed   Type of Therapy: Individual  Treatment Goals addressed:  Improve ability to manage stress and anxiety  Interventions: CBT and Supportive  Summary: Laura Atkins is a 73 y.o. female who presents with a long-standing history of depression. She was referred to this practice for continuity of care as she was seen at [facility] but was having difficulty scheduling a time to see a therapist. She continues to experience depressed mood and excessive worry  Since last session, patient reports decreased anxiety and improved mood. She states wanting to have peace for self and deciding to let some things go. She expresses less worry about daughter and financial contributions to household as she has decided to allow her husband to manage this. She also expresses less frustration with husband as she states she sees he has some health issues.  Suicidal/Homicidal: No  Therapist Response: Therapist works with patient to process feelings, identify areas patient can change, and identify coping statements.  Plan: Return again in 4 weeks.  Diagnosis: Axis I: Depressive Disorder NOS    Axis II: Deferred    [name], LCSW 06/04/2014

## 2014-06-04 NOTE — Patient Instructions (Signed)
Discussed orally 

## 2014-06-09 ENCOUNTER — Ambulatory Visit (INDEPENDENT_AMBULATORY_CARE_PROVIDER_SITE_OTHER): Payer: Medicare HMO | Admitting: Neurology

## 2014-06-09 ENCOUNTER — Encounter: Payer: Self-pay | Admitting: Neurology

## 2014-06-09 ENCOUNTER — Encounter (INDEPENDENT_AMBULATORY_CARE_PROVIDER_SITE_OTHER): Payer: Self-pay

## 2014-06-09 VITALS — BP 154/67 | HR 87 | Ht 68.0 in | Wt 283.0 lb

## 2014-06-09 DIAGNOSIS — E1144 Type 2 diabetes mellitus with diabetic amyotrophy: Secondary | ICD-10-CM

## 2014-06-09 DIAGNOSIS — R269 Unspecified abnormalities of gait and mobility: Secondary | ICD-10-CM

## 2014-06-09 DIAGNOSIS — E1149 Type 2 diabetes mellitus with other diabetic neurological complication: Secondary | ICD-10-CM

## 2014-06-09 DIAGNOSIS — G541 Lumbosacral plexus disorders: Secondary | ICD-10-CM

## 2014-06-09 NOTE — Progress Notes (Signed)
History of Present Illness:  Laura Atkins is a 73 yo AA female, came in for evaluation of diabetic peripheral neuropathy, bilateral lumbosacral plexopathy, gait difficulty.  She has past medical history of diabetes for more than 10 years,diabetic peripheral neuropathy,anxiety, hypertension, hyperlipidemia  She was admiitted to [facility], later was transferred to [facility] in May 2013 for generalized weakness dizziness, persistent vomiting, decreased PO.  On 5/3 the patient developed a low grade fever.  She had an episode of projectile vomiting in the morning of May 3rd, .  In the afternoon she lost consciousness and experienced tonic clonic seizure.  Then developed respiratory distress, was intubated and moved to the ICU.   It was felt her seizure was due to Xanax withdrawal.   MRI brain  in May 2013: Moderate small vessel disease type changes, global atrophy without hydrocephalus. MRI lumbar 2012: Scoliosis convex to the left at the apex at L3. L4-5:  Bilateral facet arthropathy with 2 mm of anterolisthesis. bulging of the disc.  Mild narrowing of the right lateral recess.  L5-S1:  Facet arthropathy without slippage.  No significant disc pathology.   EEG in Jan 23, 2012, was abnormal.  The slowing seen over the right hemisphere, worse in the posterior right quadrant suggest an underlying functional or structural abnormality in that location.  No interictal epileptiform discharges were seen.   Since the admission, she has right anterior shin paresthesia, worsening gait difficulty, right leg weakness. worsening urinary urgency,incontinence. she has chronic low back pain She was seen by [facility] neurologist Dr. Jacelyn Grip, [place] [facility].  Electrodiagnostic study in 08/2012 confirmed a severe axonal peripheral neuropathy, there was also evidence of active denervation at bilateral lumbar sacral myotomes, right side is much worse than the left side, consistent with bilateral lumbosacral plexopathy,  most likely due to her poorly controlled diabetes.  She had gone through a few sessions of physical therapy at [facility], reported mild to moderate improvement, was able to progress from walker to 4 foot cane, she continued to have severe right distal leg weakness, she is obese, very unsteady gait, she is also going through mental health for treatment of depression anxiety.  For a while, she wore an AFO on her right leg,  but does not have it on today, ambulating with a walker. She ran out of diabetic strips a few days ago so does not know what recent CBG has been, does not know recent A1C.   UPDATE Sep 5th 2014:  Last clinical visit was with September 2014, she continued to have gait difficulty, overall has improvement, she has regained some function of her right foot, ankle dorsi/plantar flexion,  UPDATE Sep 16th 2015: She still has mild gait difficulty, dragging her right foot some, she has no recurrent seizure, last seizure was in May 2013, was considered due to Xanax withdrawal.  She denies significant pain, has bilateral lower extremity paresthesia, her diabetes is under much better control now.  Review of Systems  Out of a complete 14 system review, the patient complains of only the following symptoms, and all other reviewed systems are negative.   Gait difficulty, bilateral feet paresthesia  Social History  patient lives at home wtih her husband [name]. Patient has a high school education. Patient has five children. Patient denies any history of tobacco, alcohol and illicit drugs. Patient drinks some caffeine. Inhaled Tobacco Use: never smoker  Family History  Patients parents are both deceased. Cancer   Past Medical History Headache, Obestiy,  Anemia High blood pressure Convulsions Vomiting axonal peripheral neuropathy  Surgical History  None listed   Physical Exam  Neck: supple no carotid bruits Respiratory: clear to auscultation bilaterally Cardiovascular:  regular rate rhythm  Neurologic Exam  Mental Status: obese, awake, alert, cooperative to history, talking, and casual conversation. Cranial Nerves: CN II-XII pupils were equal round reactive to light.  Fundi were sharp bilaterally.  Extraocular movements were full.  Visual fields were full on confrontational test.  Facial sensation and strength were normal.  Hearing was intact to finger rubbing bilaterally.  Uvula tongue were midline.  Head turning and shoulder shrugging were normal and symmetric.  Tongue protrusion into the cheeks strength were normal.  Motor: she has mild right hip flexion 5-, knee flexion 4+/5, knee extension 5/5, ankle dorsiflexion 4/4+, ankle plantar flexion 4/5-. Sensory: Length dependent decreased light touch, pinprick to knee level, decreased toe  proprioception, and vibratory sensation. Coordination: Normal finger-to-nose, heel-to-shin.  There was no dysmetria noticed. Gait and Station: obese, need to push up to get up from seated position, unsteady gait, dragging right foot some   Reflexes: Deep tendon reflexes: Biceps: 1/1, Brachioradialis: 1/1, Triceps: 1/1, Pateller: 0/0, Achilles: 0/0.  Plantar responses are flexor.  Assessment and Plan:  Mr. Hannen is a 73 years old African American female, with long-standing poorly controlled diabetes, A1C was more than 13,   Hx of  seizure, likely due to the Xanax withdraw, also had new onset right lower extremity weakness, right anterior thigh paresthesia, right ankle weakness, severe axonal peripheral neuropathy and right lumbar plexopathy due to poorly controlled diabetes,    She has made steady progress, continues to have mild gait difficulty, right distal leg weakness,   I will refer her to home physical therapy. Optimize diabetic controls.  RTC as needed.

## 2014-06-14 ENCOUNTER — Ambulatory Visit (INDEPENDENT_AMBULATORY_CARE_PROVIDER_SITE_OTHER): Payer: Medicare HMO | Admitting: Endocrinology

## 2014-06-14 ENCOUNTER — Encounter: Payer: Self-pay | Admitting: Endocrinology

## 2014-06-14 VITALS — BP 134/60 | HR 82 | Temp 98.4°F | Ht 68.0 in | Wt 283.0 lb

## 2014-06-14 DIAGNOSIS — E1149 Type 2 diabetes mellitus with other diabetic neurological complication: Secondary | ICD-10-CM

## 2014-06-14 NOTE — Progress Notes (Signed)
Subjective:    Patient ID: Laura Atkins, female    DOB: 12-26-1940, 73 y.o.   MRN: [id number]  HPI Pt returns for f/u of diabetes mellitus:  DM type: insulin-requiring type 2 Dx'ed: [id number] Complications: sensory neuropathy of the lower extremities, she has associated PAD and foot ulcer Therapy: insulin since 2005 GDM: never DKA: never Severe hypoglycemia: never Pancreatitis: never Other info: she takes multiple daily injections  Interval history:  she brings a record of her cbg's which i have reviewed today.  It varies from 80-200, but most are in the low-100's.  There is no trend throughout the day.  pt states she feels well in general. Past Medical History  Diagnosis Date  . Hypertension   . Diabetes mellitus   . Hyperlipidemia   . Obesity   . Anemia   . Anxiety and depression   . GERD (gastroesophageal reflux disease)   . Pruritus   . Osteoarthritis     Left knee; right shoulder; chronic neck and back pain  . Obstructive sleep apnea   . Urinary incontinence   . Tremor     This started months ago after her seizure progressing to very poor hand writing  . Seizures   . Depression   . Anxiety   . [id number])     Past Surgical History  Procedure Laterality Date  . Abdominal hysterectomy    . Breast excisional biopsy      Left; cyst  . Cholecystectomy    . Cataract extraction w/ intraocular lens implant Left 09/07/2013  . Eye surgery Left 09/07/2013    cataract    History   Social History  . Marital Status: Married    Spouse Name: saunders    Number of Children: 5  . Years of Education: 12   Occupational History  . retired     retired   Social History Main Topics  . Smoking status: Never Smoker   . Smokeless tobacco: Never Used  . Alcohol Use: No  . Drug Use: No  . Sexual Activity: Yes    Birth Control/ Protection: Surgical   Other Topics Concern  . Not on file   Social History Narrative   Patient lives at home with her husband  [name].  Patient is retired.    Right handed.    Five Children.    Caffeine- 2 daily    Current Outpatient Prescriptions on File Prior to Visit  Medication Sig Dispense Refill  . acetaminophen (EXTRA STRENGTH PAIN RELIEF) 500 MG tablet Take 500 mg by mouth every 6 (six) hours as needed for pain.      Marland Kitchen amLODipine (NORVASC) 10 MG tablet TAKE 1 TABLET BY MOUTH EVERY DAY  90 tablet  0  . aspirin 81 MG tablet Take 81 mg by mouth daily.        Marland Kitchen atorvastatin (LIPITOR) 40 MG tablet TAKE 1 TABLET BY MOUTH DAILY  30 tablet  3  . B-D INS SYR ULTRAFINE 1CC/30G 30G X 1/2" 1 ML MISC USE AS DIRECTED  100 each  2  . benazepril (LOTENSIN) 20 MG tablet TAKE ONE TABLET BY MOUTH EVERY DAY  90 tablet  0  . clotrimazole (LOTRIMIN) 1 % cream Apply 1 application topically 2 (two) times daily.  30 g  0  . diclofenac sodium (VOLTAREN) 1 % GEL Apply 4 g topically 4 (four) times daily.  1 Tube  12  . diphenhydrAMINE (BENADRYL) 25 MG tablet Take 25 mg by mouth every 6 (  six) hours as needed for itching.      Marland Kitchen FLUoxetine (PROZAC) 20 MG capsule Take 1 capsule (20 mg total) by mouth 2 (two) times daily.  60 capsule  2  . furosemide (LASIX) 40 MG tablet TAKE 1 TABLET BY MOUTH TWICE DAILY  180 tablet  0  . gabapentin (NEURONTIN) 300 MG capsule TAKE 1 CAPSULE BY MOUTH THREE TIMES DAILY  270 capsule  0  . Glucose Blood (FREESTYLE LITE TEST VI)       . glucose blood (ONE TOUCH ULTRA TEST) test strip Three times daily testing  DX 250.00  100 each  11  . insulin regular (HUMULIN R) 100 units/mL injection Inject 15 units 3 times a day before meals.  20 mL  2  . Multiple Vitamins-Minerals (CENTRUM SILVER ULTRA WOMENS PO) Take by mouth daily.      . mupirocin cream (BACTROBAN) 2 % Apply 1 application topically 2 (two) times daily. Apply to affected naily twice daily for 1 week , then as needed  30 g  0  . omeprazole (PRILOSEC) 20 MG capsule TAKE 1 CAPSULE BY MOUTH DAILY  90 capsule  0  . potassium chloride SA (K-DUR,KLOR-CON)  20 MEQ tablet TAKE 1 TABLET BY MOUTH EVERY DAY  90 tablet  0   No current facility-administered medications on file prior to visit.    Allergies  Allergen Reactions  . Penicillins Shortness Of Breath, Itching and Rash  . Prednisone Shortness Of Breath, Itching and Rash  . Propoxyphene N-Acetaminophen Itching and Nausea And Vomiting    Family History  Problem Relation Age of Onset  . ADD / ADHD Grandchild   . Bipolar disorder Grandchild   . Bipolar disorder Daughter   . Alcohol abuse Neg Hx   . Drug abuse Neg Hx   . Cancer Mother 21    lung   . Kidney disease Father   . Diabetes Sister     BP 134/60  Pulse 82  Temp(Src) 98.4 F (36.9 C) (Oral)  Ht 5\' 8"  (1.727 m)  Wt 283 lb (128.368 kg)  BMI 43.04 kg/m2  SpO2 96%   Review of Systems She denies hypoglycemia and weight change.    Objective:   Physical Exam VITAL SIGNS:  See vs page GENERAL: no distress SKIN:  Insulin injection sites at the anterior abdomen are normal.        Assessment & Plan:  DM: apparently well-controlled now  Patient is advised the following: Patient Instructions  check your blood sugar 4 times a [id number] the 3 meals, and at bedtime.  also check if you have symptoms of your blood sugar being too high or too low.  please keep a record of the readings and bring it to your next appointment here.  You can write it on any piece of paper.  please call us sooner if your blood sugar goes below 70, or if you have a lot of readings over 200. Please continue the same insulins.   Please do the a1c with Dr Moshe Cipro in December.   Please come back for a follow-up appointment in January.

## 2014-06-14 NOTE — Patient Instructions (Addendum)
check your blood sugar 4 times a [id number] the 3 meals, and at bedtime.  also check if you have symptoms of your blood sugar being too high or too low.  please keep a record of the readings and bring it to your next appointment here.  You can write it on any piece of paper.  please call us sooner if your blood sugar goes below 70, or if you have a lot of readings over 200. Please continue the same insulins.   Please do the a1c with Dr Moshe Cipro in December.   Please come back for a follow-up appointment in January.

## 2014-06-15 ENCOUNTER — Encounter: Payer: Self-pay | Admitting: *Deleted

## 2014-06-15 LAB — HEMOGLOBIN A1C: A1c: 8.7

## 2014-06-21 ENCOUNTER — Telehealth: Payer: Self-pay | Admitting: Family Medicine

## 2014-06-21 NOTE — Telephone Encounter (Signed)
Request in for PWC, no record of suggesting this. Pls call pt, see if she feels she needs this, explain she will need PT eval to concur before it can be ordered  Pls refer for PT eval for eval for PWC if she requests this , I will sign. (Pt may not have ordered this and may be a random call from supplier, if so, pls ex[plain this tio her so she can disregard further calls from them

## 2014-06-23 ENCOUNTER — Other Ambulatory Visit: Payer: Self-pay | Admitting: Family Medicine

## 2014-06-23 ENCOUNTER — Other Ambulatory Visit (HOSPITAL_COMMUNITY): Payer: Self-pay | Admitting: Psychiatry

## 2014-06-23 NOTE — Telephone Encounter (Signed)
Unable to leave message on # listed.  Letter sent for patient to contact office to further discuss.

## 2014-06-29 ENCOUNTER — Other Ambulatory Visit: Payer: Self-pay

## 2014-06-29 MED ORDER — FLUOXETINE HCL 20 MG PO CAPS: 20.0000 mg | ORAL_CAPSULE | Freq: Two times a day (BID) | ORAL | Status: DC

## 2014-07-09 ENCOUNTER — Ambulatory Visit (INDEPENDENT_AMBULATORY_CARE_PROVIDER_SITE_OTHER): Payer: Medicare HMO | Admitting: Psychiatry

## 2014-07-09 DIAGNOSIS — F32A Depression, unspecified: Secondary | ICD-10-CM

## 2014-07-09 DIAGNOSIS — F329 Major depressive disorder, single episode, unspecified: Secondary | ICD-10-CM

## 2014-07-09 NOTE — Progress Notes (Signed)
   THERAPIST PROGRESS NOTE  Session Time: Friday 07/09/2014 11:00 AM - 11:55 AM  Participation Level: Active  Behavioral Response: CasualAlertAnxious and Depressed  Type of Therapy: Individual Therapy  Treatment Goals addressed: Improve coping skills to alleviate symptoms of depression (isolative behaviors, depressed mood, ruminating thoughts, and negative thinking) per self report for 3 consecutive months  Interventions: Supportive  Summary: Laura Atkins is a 73 y.o. female who presents with a long-standing history of depression. She was referred to this practice for continuity of care as she was seen at [facility] but was having difficulty scheduling a time to see a therapist. She continues to experience depressed mood and excessive worry   Patient reports increased stress, anxiety, and depressed mood since last session. She worries about her children and grandchildren and their welfare. She expresses frustration with husband as he does not listen to her concerns and encourages her to try to stay out of the situation. Patient reports trying not to say anything to people including her husband when she becomes upset. She has increased isolative behaviors. She expresses frustration about not being more involved in activities that she would like to pursue as husband is not interested in pursuing these activities with patient hildren may not be receiving adequate parental care. Patient also continues to ruminate about the past and states difficulty letting things go.  Suicidal/Homicidal: No  Therapist Response: Therapist works with patient to process feelings, reviewi and revise treatment plan, discussed the effects of depression on thought patterns, discuss importance of medication compliance, and begin to identify ways to decrease isolative behaviors  Plan: Return again in 2-3 weeks.  Patient agrees to identify 3 activities she would like to pursue. Patient agrees to identify 3 people with whom  she would like to initiate contact to develop healthy and positive relationships  Diagnosis: Axis I: Depressive Disorder NOS    Axis II: No diagnosis    BYNUM,PEGGY, LCSW 07/09/2014

## 2014-07-09 NOTE — Patient Instructions (Signed)
Discussed orally 

## 2014-07-12 ENCOUNTER — Ambulatory Visit (INDEPENDENT_AMBULATORY_CARE_PROVIDER_SITE_OTHER): Payer: Medicare HMO | Admitting: Psychiatry

## 2014-07-12 ENCOUNTER — Encounter (HOSPITAL_COMMUNITY): Payer: Self-pay | Admitting: Psychiatry

## 2014-07-12 VITALS — BP 136/58 | HR 68 | Ht 68.0 in | Wt 282.0 lb

## 2014-07-12 DIAGNOSIS — F329 Major depressive disorder, single episode, unspecified: Secondary | ICD-10-CM

## 2014-07-12 DIAGNOSIS — F32A Depression, unspecified: Secondary | ICD-10-CM

## 2014-07-12 MED ORDER — FLUOXETINE HCL 20 MG PO CAPS: 20.0000 mg | ORAL_CAPSULE | Freq: Two times a day (BID) | ORAL | Status: DC
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
## 2014-07-23 ENCOUNTER — Other Ambulatory Visit: Payer: Self-pay | Admitting: Family Medicine

## 2014-07-28 ENCOUNTER — Other Ambulatory Visit: Payer: Self-pay

## 2014-07-28 MED ORDER — GABAPENTIN 400 MG PO CAPS: ORAL_CAPSULE | ORAL | Status: DC

## 2014-07-29 ENCOUNTER — Telehealth: Payer: Self-pay | Admitting: Endocrinology

## 2014-07-29 MED ORDER — "INSULIN SYRINGE-NEEDLE U-100 30G X 1/2"" 1 ML MISC": Status: DC

## 2014-07-29 NOTE — Telephone Encounter (Signed)
Patient need refills of syringes needles for her diabetes.

## 2014-07-29 NOTE — Telephone Encounter (Signed)
Rx sent to [facility].

## 2014-08-09 ENCOUNTER — Ambulatory Visit (HOSPITAL_COMMUNITY): Payer: Self-pay | Admitting: Psychiatry

## 2014-08-10 ENCOUNTER — Ambulatory Visit (INDEPENDENT_AMBULATORY_CARE_PROVIDER_SITE_OTHER): Payer: Medicare HMO | Admitting: Ophthalmology

## 2014-08-10 DIAGNOSIS — H35033 Hypertensive retinopathy, bilateral: Secondary | ICD-10-CM

## 2014-08-10 DIAGNOSIS — I10 Essential (primary) hypertension: Secondary | ICD-10-CM

## 2014-08-10 DIAGNOSIS — H43813 Vitreous degeneration, bilateral: Secondary | ICD-10-CM

## 2014-08-10 DIAGNOSIS — E11339 Type 2 diabetes mellitus with moderate nonproliferative diabetic retinopathy without macular edema: Secondary | ICD-10-CM

## 2014-08-10 DIAGNOSIS — E11319 Type 2 diabetes mellitus with unspecified diabetic retinopathy without macular edema: Secondary | ICD-10-CM

## 2014-08-12 ENCOUNTER — Telehealth: Payer: Self-pay | Admitting: *Deleted

## 2014-08-12 NOTE — Telephone Encounter (Signed)
Patient aware that it had been ordered by Loanne Drilling and she could go have it done anytime now

## 2014-08-12 NOTE — Telephone Encounter (Signed)
Pt called and LMOM pt has a appt with Dr. Moshe Cipro 09/06/14 at 10:30, pt said she see's Dr. Loanne Drilling for her diabetes and he told her she would need a A1C before seeing Dr. Moshe Cipro, pt would like to speak with a nurse. Please advise [phone]

## 2014-08-19 LAB — CBC WITH DIFFERENTIAL/PLATELET
Basophils Absolute: 0 10*3/uL (ref 0.0–0.1)
Basophils Relative: 0 % (ref 0–1)
Eosinophils Absolute: 0.2 10*3/uL (ref 0.0–0.7)
Eosinophils Relative: 4 % (ref 0–5)
HCT: 32.8 % — ABNORMAL LOW (ref 36.0–46.0)
Hemoglobin: 10 g/dL — ABNORMAL LOW (ref 12.0–15.0)
Lymphocytes Relative: 37 % (ref 12–46)
Lymphs Abs: 2.2 10*3/uL (ref 0.7–4.0)
MCH: 21 pg — ABNORMAL LOW (ref 26.0–34.0)
MCHC: 30.5 g/dL (ref 30.0–36.0)
MCV: 68.8 fL — ABNORMAL LOW (ref 78.0–100.0)
Monocytes Absolute: 0.3 10*3/uL (ref 0.1–1.0)
Monocytes Relative: 5 % (ref 3–12)
Neutro Abs: 3.2 10*3/uL (ref 1.7–7.7)
Neutrophils Relative %: 54 % (ref 43–77)
Platelets: 241 10*3/uL (ref 150–400)
RBC: 4.77 MIL/uL (ref 3.87–5.11)
RDW: 16.1 % — ABNORMAL HIGH (ref 11.5–15.5)
WBC: 6 10*3/uL (ref 4.0–10.5)

## 2014-08-19 LAB — COMPLETE METABOLIC PANEL WITH GFR
ALT: 26 U/L (ref 0–35)
AST: 30 U/L (ref 0–37)
Albumin: 4.1 g/dL (ref 3.5–5.2)
Alkaline Phosphatase: 115 U/L (ref 39–117)
BUN: 18 mg/dL (ref 6–23)
CO2: 28 mEq/L (ref 19–32)
Calcium: 9.5 mg/dL (ref 8.4–10.5)
Chloride: 101 mEq/L (ref 96–112)
Creat: 0.87 mg/dL (ref 0.50–1.10)
GFR, Est African American: 78 mL/min
GFR, Est Non African American: 67 mL/min
Glucose, Bld: 202 mg/dL — ABNORMAL HIGH (ref 70–99)
Potassium: 4.3 mEq/L (ref 3.5–5.3)
Sodium: 140 mEq/L (ref 135–145)
Total Bilirubin: 0.3 mg/dL (ref 0.2–1.2)
Total Protein: 7.2 g/dL (ref 6.0–8.3)

## 2014-08-19 LAB — TSH: TSH: 0.989 u[IU]/mL (ref 0.350–4.500)

## 2014-08-19 LAB — HEMOGLOBIN A1C
Hgb A1c MFr Bld: 8 % — ABNORMAL HIGH (ref <5.7)
Mean Plasma Glucose: 183 mg/dL — ABNORMAL HIGH (ref <117)

## 2014-08-23 ENCOUNTER — Other Ambulatory Visit: Payer: Self-pay

## 2014-08-23 MED ORDER — "INSULIN SYRINGE-NEEDLE U-100 30G X 1/2"" 1 ML MISC": Status: DC

## 2014-09-01 ENCOUNTER — Ambulatory Visit (INDEPENDENT_AMBULATORY_CARE_PROVIDER_SITE_OTHER): Payer: Medicare HMO | Admitting: Psychiatry

## 2014-09-01 ENCOUNTER — Ambulatory Visit (HOSPITAL_COMMUNITY): Payer: Self-pay | Admitting: Psychiatry

## 2014-09-01 DIAGNOSIS — F419 Anxiety disorder, unspecified: Secondary | ICD-10-CM

## 2014-09-01 DIAGNOSIS — F329 Major depressive disorder, single episode, unspecified: Secondary | ICD-10-CM

## 2014-09-01 DIAGNOSIS — F32A Depression, unspecified: Secondary | ICD-10-CM

## 2014-09-01 NOTE — Progress Notes (Signed)
    THERAPIST PROGRESS NOTE  Session Time: Wednesday 09/01/2014 11:00 AM - 11:55 AM  Participation Level: Active  Behavioral Response: CasualAlert/Anxious  Type of Therapy: Individual Therapy  Treatment Goals addressed: Improve coping skills to alleviate symptoms of depression (isolative behaviors, depressed mood, ruminating thoughts, and negative thinking) per self report for 3 consecutive months  Interventions: Supportive  Summary: Laura Atkins is a 73 y.o. female who presents with a long-standing history of depression. She was referred to this practice for continuity of care as she was seen at [facility] but was having difficulty scheduling a time to see a therapist. She continues to experience depressed mood and excessive worry   Patient reports continued stress and worry about her children and grandchildren. She expresses frustration regarding their choices and behaviors. She also is disappointed that life isn't quiet and peaceful in her household as she had expected at this time in her life. She reports stress related to one of her daughters residing with patient and her husband. However, patient reports avoiding isolative behaviors and trying to change thought patterns. She and her husband have started participating in the [place] at the [facility]. Patient enjoys the increased social interaction and the increased activity. She states feeling much better. She also reports discontinuing watching shows on TV that tend to cause stress for patient. She also has limited contact with friends who have a pattern of telling her about their problems.    Suicidal/Homicidal: No  Therapist Response: Therapist works with patient to process feelings, identify coping statements regarding situation with children and grandchildren as well as ways to set and respect boundaries, praise patient's increased social involvement and increased activity. r  Plan: Return again in 2-3 weeks.    Diagnosis: Axis I: Depressive Disorder NOS    Axis II: No diagnosis   Haylea Schlichting, LCSW 09/01/2014

## 2014-09-01 NOTE — Patient Instructions (Signed)
Discussed orally 

## 2014-09-06 ENCOUNTER — Ambulatory Visit (INDEPENDENT_AMBULATORY_CARE_PROVIDER_SITE_OTHER): Payer: Medicare HMO | Admitting: Family Medicine

## 2014-09-06 ENCOUNTER — Ambulatory Visit (INDEPENDENT_AMBULATORY_CARE_PROVIDER_SITE_OTHER): Payer: Medicare HMO

## 2014-09-06 ENCOUNTER — Encounter: Payer: Self-pay | Admitting: Family Medicine

## 2014-09-06 VITALS — BP 144/62 | HR 83 | Resp 16 | Ht 68.0 in | Wt 284.0 lb

## 2014-09-06 DIAGNOSIS — L299 Pruritus, unspecified: Secondary | ICD-10-CM

## 2014-09-06 DIAGNOSIS — I10 Essential (primary) hypertension: Secondary | ICD-10-CM

## 2014-09-06 DIAGNOSIS — E785 Hyperlipidemia, unspecified: Secondary | ICD-10-CM

## 2014-09-06 DIAGNOSIS — M7989 Other specified soft tissue disorders: Secondary | ICD-10-CM | POA: Insufficient documentation

## 2014-09-06 DIAGNOSIS — F419 Anxiety disorder, unspecified: Secondary | ICD-10-CM

## 2014-09-06 DIAGNOSIS — Z23 Encounter for immunization: Secondary | ICD-10-CM

## 2014-09-06 DIAGNOSIS — Z1239 Encounter for other screening for malignant neoplasm of breast: Secondary | ICD-10-CM | POA: Diagnosis not present

## 2014-09-06 DIAGNOSIS — F418 Other specified anxiety disorders: Secondary | ICD-10-CM

## 2014-09-06 DIAGNOSIS — F32A Depression, unspecified: Secondary | ICD-10-CM

## 2014-09-06 DIAGNOSIS — F329 Major depressive disorder, single episode, unspecified: Secondary | ICD-10-CM

## 2014-09-06 DIAGNOSIS — E1149 Type 2 diabetes mellitus with other diabetic neurological complication: Secondary | ICD-10-CM

## 2014-09-06 MED ORDER — HYDROXYZINE HCL 50 MG PO TABS: ORAL_TABLET | ORAL | Status: DC

## 2014-09-06 MED ORDER — FUROSEMIDE 40 MG PO TABS: ORAL_TABLET | ORAL | Status: DC

## 2014-09-06 MED ORDER — HYDROXYZINE PAMOATE 25 MG PO CAPS: ORAL_CAPSULE | ORAL | Status: DC

## 2014-09-06 MED ORDER — POTASSIUM CHLORIDE CRYS ER 20 MEQ PO TBCR: 20.0000 meq | EXTENDED_RELEASE_TABLET | Freq: Two times a day (BID) | ORAL | Status: DC

## 2014-09-06 NOTE — Patient Instructions (Signed)
F/u in 3.5  month, call if you need me before  New for swelling is lasix tHREE daily, up from 2 per day, and Potassium TWO daily, up from one daily, reduce salt intake  For itching hydroxyzine is prescribed for bedtime use  Flu vaccine today  Toe and  Feet look good  You need a mammogram, and also colonoscopy, referrals ar entered

## 2014-09-06 NOTE — Progress Notes (Signed)
Subjective:    Patient ID: Laura Atkins, female    DOB: 1941/08/16, 73 y.o.   MRN: [id number]  HPI The PT is here for follow up and re-evaluation of chronic medical conditions, medication management and review of any available recent lab and radiology data.  Preventive health is updated, specifically  Cancer screening and Immunization.    or concerns regarding consultations or procedures which the PT has had in the interim are  addressed. The PT denies any adverse reactions to current medications since the last visit.  C/o leg swelluing over past 4 to 6 weeks , also c/o increased generalized itching, no rash      Review of Systems See HPI Denies recent fever or chills. Denies sinus pressure, nasal congestion, ear pain or sore throat. Denies chest congestion, productive cough or wheezing. Denies chest pains, palpitations , PND or orthopnea Denies abdominal pain, nausea, vomiting,diarrhea or constipation.   Denies dysuria, frequency, hesitancy or incontinence. C/o chronic  joint pain, swelling and limitation in mobility.Uses assistive device Denies headaches, seizures, numbness, or tingling. Denies uncontrolled  depression, anxiety or insomnia. Denies skin break down or rash.        Objective:   Physical Exam BP 144/62 mmHg  Pulse 83  Resp 16  Ht 5\' 8"  (1.727 m)  Wt 284 lb (128.822 kg)  BMI 43.19 kg/m2  SpO2 94% Patient alert and oriented and in no cardiopulmonary distress.  HEENT: No facial asymmetry, EOMI,   oropharynx pink and moist.  Neck decreased though adequate ROM no JVD, no mass.  Chest: Clear to auscultation bilaterally.  CVS: S1, S2 no murmurs, no S3.Regular rate.  ABD: Soft non tender.   Ext: One plus pitting  edema  MS: Decreased ROM spine, shoulders, hips and knees.  Skin: Intact, no ulcerations or rash noted.  Psych: Good eye contact, normal affect. Memory intact not anxious or depressed appearing.  CNS: CN 2-12 intact, power,  normal  throughout.no focal deficits noted.        Assessment & Plan:  Essential hypertension Controlled, no change in medication    DM (diabetes mellitus) type II controlled, neurological manifestation Improving, followed by endo Denies polyuria, polydipsia, blurred vision , or hypoglycemic episodes.    Leg swelling Dose increase on lasix , with increased potassium to cover   Pruritus Hydroxyzine prescribed for symptom control, pt warned re sedative s/e   Anxiety and depression Improved markedly, now being treated by psychiatry   Hyperlipemia Hyperlipidemia:Low fat diet discussed and encouraged. Controlled, no change in medication    Lipid Panel  Lab Results  Component Value Date   CHOL 154 12/13/2014   HDL 67 12/13/2014   LDLCALC 65 12/13/2014   TRIG 108 12/13/2014   CHOLHDL 2.3 12/13/2014         Morbid obesity Deteriorated. Patient re-educated about  the importance of commitment to a  minimum of 150 minutes of exercise per week.  The importance of healthy food choices with portion control discussed. Encouraged to start a food diary, count calories and to consider  joining a support group. Sample diet sheets offered. Goals set by the patient for the next several months.   Weight /BMI 01/20/2015 01/03/2015 12/13/2014  WEIGHT 286 lb 288 lb 295 lb  HEIGHT 5' 7.5" 5' 7.5" 5\' 7"   BMI 44.11 kg/m2 44.42 kg/m2 46.19 kg/m2  Some encounter information is confidential and restricted. Go to Review Flowsheets activity to see all data.    Current exercise per week 60  minutes.

## 2014-09-06 NOTE — Assessment & Plan Note (Signed)
Controlled, no change in medication  

## 2014-09-09 ENCOUNTER — Ambulatory Visit (HOSPITAL_COMMUNITY)
Admission: RE | Admit: 2014-09-09 | Discharge: 2014-09-09 | Disposition: A | Discharge status: Home / Self Care | Payer: Medicare HMO | Source: Ambulatory Visit | Attending: Family Medicine | Admitting: Family Medicine

## 2014-09-09 DIAGNOSIS — Z1239 Encounter for other screening for malignant neoplasm of breast: Secondary | ICD-10-CM

## 2014-09-09 DIAGNOSIS — Z1231 Encounter for screening mammogram for malignant neoplasm of breast: Secondary | ICD-10-CM | POA: Insufficient documentation

## 2014-09-14 ENCOUNTER — Telehealth: Payer: Self-pay | Admitting: Family Medicine

## 2014-09-14 DIAGNOSIS — Z1211 Encounter for screening for malignant neoplasm of colon: Secondary | ICD-10-CM

## 2014-09-14 NOTE — Telephone Encounter (Signed)
Order entered

## 2014-09-15 ENCOUNTER — Encounter (INDEPENDENT_AMBULATORY_CARE_PROVIDER_SITE_OTHER): Payer: Self-pay | Admitting: *Deleted

## 2014-09-18 ENCOUNTER — Emergency Department (HOSPITAL_COMMUNITY)
Admission: EM | Admit: 2014-09-18 | Discharge: 2014-09-18 | Disposition: A | Discharge status: Home / Self Care | Payer: Medicare HMO | Attending: Emergency Medicine | Admitting: Emergency Medicine

## 2014-09-18 ENCOUNTER — Encounter (HOSPITAL_COMMUNITY): Payer: Self-pay | Admitting: Emergency Medicine

## 2014-09-18 DIAGNOSIS — G40909 Epilepsy, unspecified, not intractable, without status epilepticus: Secondary | ICD-10-CM | POA: Insufficient documentation

## 2014-09-18 DIAGNOSIS — I10 Essential (primary) hypertension: Secondary | ICD-10-CM | POA: Insufficient documentation

## 2014-09-18 DIAGNOSIS — Z880 Allergy status to penicillin: Secondary | ICD-10-CM | POA: Diagnosis not present

## 2014-09-18 DIAGNOSIS — Z79899 Other long term (current) drug therapy: Secondary | ICD-10-CM | POA: Insufficient documentation

## 2014-09-18 DIAGNOSIS — M79644 Pain in right finger(s): Secondary | ICD-10-CM | POA: Diagnosis present

## 2014-09-18 DIAGNOSIS — L03011 Cellulitis of right finger: Secondary | ICD-10-CM | POA: Diagnosis not present

## 2014-09-18 DIAGNOSIS — Z794 Long term (current) use of insulin: Secondary | ICD-10-CM | POA: Diagnosis not present

## 2014-09-18 DIAGNOSIS — E669 Obesity, unspecified: Secondary | ICD-10-CM | POA: Diagnosis not present

## 2014-09-18 DIAGNOSIS — F329 Major depressive disorder, single episode, unspecified: Secondary | ICD-10-CM | POA: Diagnosis not present

## 2014-09-18 DIAGNOSIS — Z791 Long term (current) use of non-steroidal anti-inflammatories (NSAID): Secondary | ICD-10-CM | POA: Insufficient documentation

## 2014-09-18 DIAGNOSIS — F419 Anxiety disorder, unspecified: Secondary | ICD-10-CM | POA: Diagnosis not present

## 2014-09-18 DIAGNOSIS — M1990 Unspecified osteoarthritis, unspecified site: Secondary | ICD-10-CM | POA: Insufficient documentation

## 2014-09-18 DIAGNOSIS — E119 Type 2 diabetes mellitus without complications: Secondary | ICD-10-CM | POA: Insufficient documentation

## 2014-09-18 DIAGNOSIS — Z862 Personal history of diseases of the blood and blood-forming organs and certain disorders involving the immune mechanism: Secondary | ICD-10-CM | POA: Insufficient documentation

## 2014-09-18 DIAGNOSIS — Z7982 Long term (current) use of aspirin: Secondary | ICD-10-CM | POA: Insufficient documentation

## 2014-09-18 DIAGNOSIS — Z792 Long term (current) use of antibiotics: Secondary | ICD-10-CM | POA: Insufficient documentation

## 2014-09-18 MED ORDER — BACITRACIN-NEOMYCIN-POLYMYXIN 400-5-5000 EX OINT
TOPICAL_OINTMENT | Freq: Once | CUTANEOUS | Status: AC
  Administered 2014-09-18: 1 via TOPICAL
  Filled 2014-09-18: qty 1

## 2014-09-18 MED ORDER — SULFAMETHOXAZOLE-TRIMETHOPRIM 800-160 MG PO TABS
1.0000 | ORAL_TABLET | Freq: Once | ORAL | Status: AC
  Administered 2014-09-18: 1 via ORAL
  Filled 2014-09-18: qty 1

## 2014-09-18 MED ORDER — LIDOCAINE HCL (PF) 2 % IJ SOLN
10.0000 mL | Freq: Once | INTRAMUSCULAR | Status: DC
  Filled 2014-09-18: qty 10

## 2014-09-18 MED ORDER — SULFAMETHOXAZOLE-TRIMETHOPRIM 800-160 MG PO TABS: 1.0000 | ORAL_TABLET | Freq: Two times a day (BID) | ORAL | Status: DC

## 2014-09-18 NOTE — ED Notes (Signed)
Patient with no complaints at this time. Respirations even and unlabored. Skin warm/dry. Discharge instructions reviewed with patient at this time. Patient given opportunity to voice concerns/ask questions. Patient discharged at this time and left Emergency Department with steady gait.   

## 2014-09-18 NOTE — Discharge Instructions (Signed)
Paronychia  Paronychia is an infection of the skin caused by germs. It happens by the fingernail or toenail. You can avoid it by not:  Pulling on hangnails.  Nail biting.  Thumb sucking.  Cutting fingernails and toenails too short.  Cutting the skin at the base and sides of the fingernail or toenail (cuticle). HOME CARE  Keep the fingers or toes very dry. Put rubber gloves over cotton gloves when putting hands in water.  Keep the wound clean and bandaged (dressed) as told by your doctor.  Soak the fingers or toes in warm water for 15 to 20 minutes. Soak them 3 to 4 times per day for germ infections. Fungal infections are difficult to treat. Fungal infections often require treatment for a long time.  Only take medicine as told by your doctor. GET HELP RIGHT AWAY IF:   You have redness, puffiness (swelling), or pain that gets worse.  You see yellowish-white fluid (pus) coming from the wound.  You have a fever.  You have a bad smell coming from the wound or bandage. MAKE SURE YOU:  Understand these instructions.  Will watch your condition.  Will get help if you are not doing well or get worse. Document Released: 08/29/2009 Document Revised: 12/03/2011 Document Reviewed: 08/29/2009 Exit[employer] Patient Information 2015 [employer]. This information is not intended to replace advice given to you by your health care provider. Make sure you discuss any questions you have with your health care provider.  

## 2014-09-18 NOTE — ED Provider Notes (Signed)
CSN: [id number]     Arrival date & time 09/18/14  1245 History   First MD Initiated Contact with Patient 09/18/14 1522     Chief Complaint  Patient presents with  . Abscess     (Consider location/radiation/quality/duration/timing/severity/associated sxs/prior Treatment) HPI  Laura Atkins is a 73 y.o. female who presents to the Emergency Department complaining of pain and swelling of her right thumb for several days.  She describes a severe, throbbing sensation to her finger.  She reports frequent nail biting.  She states her husband tried to drain her fingernail last night and she has noticed some yellow drainage today around the edge of her fingernail and pain seems to be improving. She denies red streaks up her arm, numbness or pain extending into her hand.  She has also been applying neosporin without relief.    Past Medical History  Diagnosis Date  . Hypertension   . Diabetes mellitus   . Hyperlipidemia   . Obesity   . Anemia   . Anxiety and depression   . GERD (gastroesophageal reflux disease)   . Pruritus   . Osteoarthritis     Left knee; right shoulder; chronic neck and back pain  . Obstructive sleep apnea   . Urinary incontinence   . Tremor     This started months ago after her seizure progressing to very poor hand writing  . Seizures   . Depression   . Anxiety   . [id number])    Past Surgical History  Procedure Laterality Date  . Abdominal hysterectomy    . Breast excisional biopsy      Left; cyst  . Cholecystectomy    . Cataract extraction w/ intraocular lens implant Left 09/07/2013  . Eye surgery Left 09/07/2013    cataract   Family History  Problem Relation Age of Onset  . ADD / ADHD Grandchild   . Bipolar disorder Grandchild   . Bipolar disorder Daughter   . Alcohol abuse Neg Hx   . Drug abuse Neg Hx   . Cancer Mother 32    lung   . Kidney disease Father   . Diabetes Sister    History  Substance Use Topics  . Smoking status: Never  Smoker   . Smokeless tobacco: Never Used  . Alcohol Use: No   OB History    No data available     Review of Systems  Constitutional: Negative for fever and chills.  Gastrointestinal: Negative for nausea and vomiting.  Musculoskeletal: Negative for joint swelling and arthralgias.  Skin: Positive for color change and wound.       Redness, pain and swelling along the fingernail of the right thumb  Neurological: Negative for weakness, numbness and headaches.  Hematological: Negative for adenopathy.  All other systems reviewed and are negative.     Allergies  Penicillins; Prednisone; and Propoxyphene n-acetaminophen  Home Medications   Prior to Admission medications   Medication Sig Start Date End Date Taking? Authorizing Provider  acetaminophen (EXTRA STRENGTH PAIN RELIEF) 500 MG tablet Take 500 mg by mouth every 6 (six) hours as needed for pain.   Yes Historical Provider, MD  aspirin 81 MG tablet Take 81 mg by mouth daily.     Yes Historical Provider, MD  hydrOXYzine (VISTARIL) 25 MG capsule One capsule at bedtime for itching Patient taking differently: Take 25 mg by mouth at bedtime. One capsule at bedtime for itching 09/06/14  Yes Fayrene Helper, MD  insulin NPH Human (HUMULIN N,NOVOLIN  N) 100 UNIT/ML injection Inject 30 Units into the skin at bedtime.   Yes Historical Provider, MD  insulin regular (HUMULIN R) 100 units/mL injection Inject 15 units 3 times a day before meals. 05/25/14  Yes Renato Shin, MD  amLODipine (NORVASC) 10 MG tablet TAKE 1 TABLET BY MOUTH EVERY DAY 06/24/14   Fayrene Helper, MD  atorvastatin (LIPITOR) 40 MG tablet TAKE 1 TABLET BY MOUTH DAILY 06/24/14   Fayrene Helper, MD  benazepril (LOTENSIN) 20 MG tablet TAKE 1 TABLET BY MOUTH EVERY DAILY 06/24/14   Fayrene Helper, MD  clotrimazole (LOTRIMIN) 1 % cream Apply 1 application topically 2 (two) times daily. 01/31/14   Fayrene Helper, MD  diclofenac sodium (VOLTAREN) 1 % GEL Apply 4 g topically  4 (four) times daily. 03/12/13   Marcial Pacas, MD  FLUoxetine (PROZAC) 20 MG capsule Take 1 capsule (20 mg total) by mouth 2 (two) times daily. 07/12/14 07/12/15  Levonne Spiller, MD  furosemide (LASIX) 40 MG tablet Two tablets in the morning at 8am and one in the afternoon at 2pm Patient taking differently: Take 40-80 mg by mouth 2 (two) times daily. Two tablets in the morning at 8am and one in the afternoon at 2pm 09/06/14   Fayrene Helper, MD  gabapentin (NEURONTIN) 400 MG capsule TAKE 1 TABLET BY MOUTH THREE TIMES DAILY Patient taking differently: Take 400 mg by mouth 3 (three) times daily. TAKE 1 TABLET BY MOUTH THREE TIMES DAILY 07/28/14   Fayrene Helper, MD  glucose blood (ONE TOUCH ULTRA TEST) test strip Three times daily testing  DX 250.00 07/25/11   Fayrene Helper, MD  Insulin Syringe-Needle U-100 (B-D INS SYR ULTRAFINE 1CC/30G) 30G X 1/2" 1 ML MISC USE AS DIRECTED 08/23/14   Renato Shin, MD  Multiple Vitamins-Minerals (CENTRUM SILVER ULTRA WOMENS PO) Take 1 tablet by mouth daily.     Historical Provider, MD  mupirocin cream (BACTROBAN) 2 % Apply 1 application topically 2 (two) times daily. Apply to affected naily twice daily for 1 week , then as needed 01/28/14   Fayrene Helper, MD  omeprazole (PRILOSEC) 20 MG capsule TAKE 1 CAPSULE BY MOUTH DAILY 06/24/14   Fayrene Helper, MD  potassium chloride SA (K-DUR,KLOR-CON) 20 MEQ tablet Take 1 tablet (20 mEq total) by mouth 2 (two) times daily. 09/06/14   Fayrene Helper, MD   BP 147/55 mmHg  Pulse 83  Temp(Src) 98.6 F (37 C) (Oral)  Resp 19  Ht 5\' 7"  (1.702 m)  Wt 284 lb (128.822 kg)  BMI 44.47 kg/m2  SpO2 100% Physical Exam  Constitutional: She is oriented to person, place, and time. She appears well-developed and well-nourished. No distress.  HENT:  Head: Normocephalic and atraumatic.  Cardiovascular: Normal rate, regular rhythm, normal heart sounds and intact distal pulses.   No murmur heard. Pulmonary/Chest: Effort  normal and breath sounds normal. No respiratory distress.  Musculoskeletal: Normal range of motion. She exhibits tenderness.  Mild erythema and localized fluctuance along the medial aspect of the right thumb nail.  Slight purulent drainage already present.    Neurological: She is alert and oriented to person, place, and time.  Skin: Skin is warm and dry.  Nursing note and vitals reviewed.   ED Course  Procedures (including critical care time) Labs Review Labs Reviewed - No data to display  Imaging Review No results found.   EKG Interpretation None       INCISION AND DRAINAGE Performed by: Kem Parkinson  L. Consent: Verbal consent obtained. Risks and benefits: risks, benefits and alternatives were discussed Type: paronychia  Body area: right thumb  Anesthesia: digital block   Incision was made with a #11 scalpel.  Local anesthetic: lidocaine 2% w/o epinephrine  Anesthetic total: 1 ml  Complexity: simple  Blunt dissection   Drainage: purulent  Drainage amount: small   Patient tolerance: Patient tolerated the procedure well with no immediate complications.      MDM   Final diagnoses:  Paronychia of finger, right   Pt is well appearing.  Localized paronychia of the thumb.  No felon.  Patient agrees to warm water soaks and elevate her finger when possible.  She was advised to return here for any worsening symptoms     Aras Albarran L. [place], PA-C 09/18/14 1659  Ezequiel Essex, MD 09/19/14 Laureen Abrahams

## 2014-09-18 NOTE — ED Notes (Signed)
Pt has swelling, and drainage to right thumb cuticle area. Pt reports she is also diabetic last fs was 140 a few hours ago.

## 2014-09-21 ENCOUNTER — Other Ambulatory Visit: Payer: Self-pay | Admitting: Family Medicine

## 2014-09-30 ENCOUNTER — Ambulatory Visit (HOSPITAL_COMMUNITY): Payer: Self-pay | Admitting: Psychiatry

## 2014-10-06 ENCOUNTER — Ambulatory Visit (HOSPITAL_COMMUNITY): Payer: Self-pay | Admitting: Psychiatry

## 2014-10-11 ENCOUNTER — Ambulatory Visit: Payer: Self-pay | Admitting: Endocrinology

## 2014-10-12 ENCOUNTER — Encounter (HOSPITAL_COMMUNITY): Payer: Self-pay | Admitting: Psychiatry

## 2014-10-12 ENCOUNTER — Ambulatory Visit (HOSPITAL_COMMUNITY): Payer: Self-pay | Admitting: Psychiatry

## 2014-10-13 ENCOUNTER — Ambulatory Visit (INDEPENDENT_AMBULATORY_CARE_PROVIDER_SITE_OTHER): Payer: PPO | Admitting: Psychiatry

## 2014-10-13 ENCOUNTER — Encounter (HOSPITAL_COMMUNITY): Payer: Self-pay | Admitting: Psychiatry

## 2014-10-13 VITALS — BP 146/83 | HR 84 | Ht 67.0 in | Wt 288.2 lb

## 2014-10-13 DIAGNOSIS — F329 Major depressive disorder, single episode, unspecified: Secondary | ICD-10-CM

## 2014-10-13 DIAGNOSIS — F32A Depression, unspecified: Secondary | ICD-10-CM

## 2014-10-13 MED ORDER — FLUOXETINE HCL 20 MG PO CAPS: 20.0000 mg | ORAL_CAPSULE | Freq: Two times a day (BID) | ORAL | Status: DC
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
## 2014-10-14 ENCOUNTER — Encounter: Payer: Self-pay | Admitting: Endocrinology

## 2014-10-14 ENCOUNTER — Ambulatory Visit (INDEPENDENT_AMBULATORY_CARE_PROVIDER_SITE_OTHER): Payer: PPO | Admitting: Endocrinology

## 2014-10-14 ENCOUNTER — Telehealth (HOSPITAL_COMMUNITY): Payer: Self-pay | Admitting: *Deleted

## 2014-10-14 VITALS — BP 136/70 | HR 87 | Temp 98.1°F | Ht 67.0 in | Wt 287.0 lb

## 2014-10-14 DIAGNOSIS — E1149 Type 2 diabetes mellitus with other diabetic neurological complication: Secondary | ICD-10-CM

## 2014-10-14 MED ORDER — INSULIN NPH ISOPHANE & REGULAR (70-30) 100 UNIT/ML ~~LOC~~ SUSP: SUBCUTANEOUS | Status: DC

## 2014-10-14 NOTE — Progress Notes (Signed)
Subjective:    Patient ID: Laura Atkins, female    DOB: [birth year], 74 y.o.   MRN: [id number]  HPI Pt returns for f/u of diabetes mellitus: DM type: insulin-requiring type 2 Dx'ed: [id number] Complications: polyneuropathy, PAD, and foot ulcer.   Therapy: insulin since 2005 GDM: never DKA: never Severe hypoglycemia: never Pancreatitis: never Other info: she takes multiple daily injections.   Interval history: she brings a record of her cbg's which i have reviewed today.  It varies from 90-300, but most are in the low-100's.  It is in general higher as the day goes on.  pt states she feels well in general.  Pt says she misses the lunch humalog approx twice a week.  This happens when she is away from home.   Past Medical History  Diagnosis Date  . Hypertension   . Diabetes mellitus   . Hyperlipidemia   . Obesity   . Anemia   . Anxiety and depression   . GERD (gastroesophageal reflux disease)   . Pruritus   . Osteoarthritis     Left knee; right shoulder; chronic neck and back pain  . Obstructive sleep apnea   . Urinary incontinence   . Tremor     This started months ago after her seizure progressing to very poor hand writing  . Seizures   . Depression   . Anxiety   . [id number])     Past Surgical History  Procedure Laterality Date  . Abdominal hysterectomy    . Breast excisional biopsy      Left; cyst  . Cholecystectomy    . Cataract extraction w/ intraocular lens implant Left 09/07/2013  . Eye surgery Left 09/07/2013    cataract    History   Social History  . Marital Status: Married    Spouse Name: saunders    Number of Children: 5  . Years of Education: 12   Occupational History  . retired     retired   Social History Main Topics  . Smoking status: Never Smoker   . Smokeless tobacco: Never Used  . Alcohol Use: No  . Drug Use: No  . Sexual Activity: Yes    Birth Control/ Protection: Surgical   Other Topics Concern  . Not on file   Social History  Narrative   Patient lives at home with her husband [name].  Patient is retired.    Right handed.    Five Children.    Caffeine- 2 daily    Current Outpatient Prescriptions on File Prior to Visit  Medication Sig Dispense Refill  . acetaminophen (EXTRA STRENGTH PAIN RELIEF) 500 MG tablet Take 500 mg by mouth every 6 (six) hours as needed for pain.    Marland Kitchen amLODipine (NORVASC) 10 MG tablet TAKE 1 TABLET BY MOUTH EVERY DAY 90 tablet 0  . aspirin 81 MG tablet Take 81 mg by mouth daily.      Marland Kitchen atorvastatin (LIPITOR) 40 MG tablet TAKE 1 TABLET BY MOUTH EVERY DAY 90 tablet 0  . benazepril (LOTENSIN) 20 MG tablet TAKE 1 TABLET BY MOUTH EVERY DAILY 90 tablet 0  . clotrimazole (LOTRIMIN) 1 % cream Apply 1 application topically 2 (two) times daily. 30 g 0  . diclofenac sodium (VOLTAREN) 1 % GEL Apply 4 g topically 4 (four) times daily. 1 Tube 12  . FLUoxetine (PROZAC) 20 MG capsule Take 1 capsule (20 mg total) by mouth 2 (two) times daily. 60 capsule 3  . furosemide (LASIX) 40 MG tablet  Two tablets in the morning at 8am and one in the afternoon at 2pm 90 tablet 5  . gabapentin (NEURONTIN) 400 MG capsule TAKE 1 TABLET BY MOUTH THREE TIMES DAILY 90 capsule 3  . glucose blood (ONE TOUCH ULTRA TEST) test strip Three times daily testing  DX 250.00 100 each 11  . hydrOXYzine (VISTARIL) 25 MG capsule One capsule at bedtime for itching (Patient taking differently: Take 25 mg by mouth at bedtime. One capsule at bedtime for itching) 30 capsule 4  . Insulin Syringe-Needle U-100 (B-D INS SYR ULTRAFINE 1CC/30G) 30G X 1/2" 1 ML MISC USE AS DIRECTED 100 each 2  . Multiple Vitamins-Minerals (CENTRUM SILVER ULTRA WOMENS PO) Take 1 tablet by mouth daily.     . mupirocin cream (BACTROBAN) 2 % Apply 1 application topically 2 (two) times daily. Apply to affected naily twice daily for 1 week , then as needed 30 g 0  . omeprazole (PRILOSEC) 20 MG capsule TAKE 1 CAPSULE BY MOUTH DAILY 90 capsule 0  . potassium chloride SA  (K-DUR,KLOR-CON) 20 MEQ tablet Take 1 tablet (20 mEq total) by mouth 2 (two) times daily. 60 tablet 5   No current facility-administered medications on file prior to visit.    Allergies  Allergen Reactions  . Penicillins Shortness Of Breath, Itching and Rash  . Prednisone Shortness Of Breath, Itching and Rash  . Propoxyphene N-Acetaminophen Itching and Nausea And Vomiting    Family History  Problem Relation Age of Onset  . ADD / ADHD Grandchild   . Bipolar disorder Grandchild   . Bipolar disorder Daughter   . Alcohol abuse Neg Hx   . Drug abuse Neg Hx   . Cancer Mother 57    lung   . Kidney disease Father   . Diabetes Sister     BP 136/70 mmHg  Pulse 87  Temp(Src) 98.1 F (36.7 C) (Oral)  Ht 5\' 7"  (1.702 m)  Wt 287 lb (130.182 kg)  BMI 44.94 kg/m2  SpO2 97%  Review of Systems She denies hypoglycemia.  She has gained weight.      Objective:   Physical Exam VITAL SIGNS:  See vs page.  GENERAL: no distress. Pulses: dorsalis pedis intact bilat.   Feet: no deformity. normal color and temp. 1+ bilat leg edema. There are several healed ulcers on the right foot. There is bilateral onychomycosis.  Skin: no ulcer on the feet.  Neuro: sensation is intact to touch on the feet, but decreased from normal.    Lab Results  Component Value Date   HGBA1C 8.0* 08/18/2014       Assessment & Plan:  DM: moderate exacerbation Noncompliance with lunch insulin doses: we discussed.  Pt says she wants to go to bid insulin.     Patient is advised the following: Patient Instructions  check your blood sugar 4 times a [id number] the 3 meals, and at bedtime.  also check if you have symptoms of your blood sugar being too high or too low.  please keep a record of the readings and bring it to your next appointment here.  You can write it on any piece of paper.  please call us sooner if your blood sugar goes below 70, or if you have a lot of readings over 200. Please change both  insulins to "70/30," 35 units with breakfast, and 20 units with the evening meal.   On this type of insulin schedule, you should eat meals on a regular schedule.  If a  meal is missed or significantly delayed, your blood sugar could go low. You can use up your current insulin first. Please come back for a follow-up appointment in 3 months.

## 2014-10-14 NOTE — Patient Instructions (Addendum)
check your blood sugar 4 times a [id number] the 3 meals, and at bedtime.  also check if you have symptoms of your blood sugar being too high or too low.  please keep a record of the readings and bring it to your next appointment here.  You can write it on any piece of paper.  please call us sooner if your blood sugar goes below 70, or if you have a lot of readings over 200. Please change both insulins to "70/30," 35 units with breakfast, and 20 units with the evening meal.   On this type of insulin schedule, you should eat meals on a regular schedule.  If a meal is missed or significantly delayed, your blood sugar could go low. You can use up your current insulin first. Please come back for a follow-up appointment in 3 months.

## 2014-10-15 ENCOUNTER — Ambulatory Visit (HOSPITAL_COMMUNITY): Payer: Self-pay | Admitting: Psychiatry

## 2014-10-21 ENCOUNTER — Other Ambulatory Visit: Payer: Self-pay | Admitting: Family Medicine

## 2014-10-25 ENCOUNTER — Telehealth: Payer: Self-pay | Admitting: Endocrinology

## 2014-10-25 ENCOUNTER — Other Ambulatory Visit: Payer: Self-pay

## 2014-10-25 DIAGNOSIS — M7989 Other specified soft tissue disorders: Secondary | ICD-10-CM

## 2014-10-25 MED ORDER — GLUCOSE BLOOD VI STRP: ORAL_STRIP | Status: DC

## 2014-10-25 MED ORDER — POTASSIUM CHLORIDE CRYS ER 20 MEQ PO TBCR: 20.0000 meq | EXTENDED_RELEASE_TABLET | Freq: Two times a day (BID) | ORAL | Status: DC

## 2014-10-25 NOTE — Telephone Encounter (Signed)
Patient called stating that she would like for Dr. Loanne Drilling to send an rx to [facility]  Rx: Please increase test strips   Testing 4x daily   Please advise    Thank you

## 2014-10-25 NOTE — Telephone Encounter (Signed)
Rx sent to pharmacy   

## 2014-10-27 ENCOUNTER — Telehealth: Payer: Self-pay

## 2014-10-28 NOTE — Telephone Encounter (Signed)
Patient advised the alternative is otc benadryl use prn for itch

## 2014-11-05 ENCOUNTER — Ambulatory Visit (INDEPENDENT_AMBULATORY_CARE_PROVIDER_SITE_OTHER): Payer: PPO | Admitting: Psychiatry

## 2014-11-05 DIAGNOSIS — F32A Depression, unspecified: Secondary | ICD-10-CM

## 2014-11-05 DIAGNOSIS — F419 Anxiety disorder, unspecified: Secondary | ICD-10-CM

## 2014-11-05 DIAGNOSIS — F329 Major depressive disorder, single episode, unspecified: Secondary | ICD-10-CM

## 2014-11-05 NOTE — Progress Notes (Signed)
     THERAPIST PROGRESS NOTE  Session Time: Friday 11/05/2014 11:10 AM - 11:55 AM  Participation Level: Active  Behavioral Response: CasualAlert/Anxious/Depresssed   Type of Therapy: Individual Therapy  Treatment Goals addressed: Improve coping skills to alleviate symptoms of depression (isolative behaviors, depressed mood, ruminating thoughts, and negative thinking) per self report for 3 consecutive months  Interventions: Supportive  Summary: Laura Atkins is a 74 y.o. female who presents with a long-standing history of depression. She was referred to this practice for continuity of care as she was seen at [facility] but was having difficulty scheduling a time to see a therapist. She continues to experience depressed mood and excessive worry   Patient reports increased stress and worry since last session. She expresses frustration regarding marriage as she states husband is hateful to her but acts differently in the public and at church. She also is frustrtated that he doesn't want to participate in activities she would like to pursue like attending a play. She reports initially being upset she and husband weren't invited to a relative's wedding but trying to think about it in a different way. She continues to dislike choices her pastor has made but expresses increased acceptance she has no control over his life or the past. She continues to worry about her daughter who had a stroke. She reports decreased attendance at the [facility] but says she has been attending a senior center.   Suicidal/Homicidal: No  Therapist Response: Therapist works with patient to process feelings, identify ways to pursue her interests, ( planning outings and activities with her daughter),identify coping statements, encourage patient to resume attendance at [facility] when able  Plan: Return again in 2-3 weeks.   Diagnosis: Axis I: Depressive Disorder NOS    Axis II: No diagnosis   BYNUM,PEGGY, LCSW 11/05/2014

## 2014-11-05 NOTE — Patient Instructions (Signed)
Discussed orally 

## 2014-11-22 ENCOUNTER — Other Ambulatory Visit: Payer: Self-pay | Admitting: Family Medicine

## 2014-11-25 ENCOUNTER — Other Ambulatory Visit: Payer: Self-pay | Admitting: Family Medicine

## 2014-12-03 ENCOUNTER — Ambulatory Visit (INDEPENDENT_AMBULATORY_CARE_PROVIDER_SITE_OTHER): Payer: PPO | Admitting: Psychiatry

## 2014-12-03 DIAGNOSIS — F419 Anxiety disorder, unspecified: Secondary | ICD-10-CM

## 2014-12-03 DIAGNOSIS — F329 Major depressive disorder, single episode, unspecified: Secondary | ICD-10-CM

## 2014-12-03 DIAGNOSIS — F32A Depression, unspecified: Secondary | ICD-10-CM

## 2014-12-03 NOTE — Patient Instructions (Signed)
Discussed orally 
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
## 2014-12-13 ENCOUNTER — Ambulatory Visit (INDEPENDENT_AMBULATORY_CARE_PROVIDER_SITE_OTHER): Payer: PPO | Admitting: Family Medicine

## 2014-12-13 ENCOUNTER — Encounter: Payer: Self-pay | Admitting: Family Medicine

## 2014-12-13 VITALS — BP 138/64 | HR 82 | Resp 18 | Ht 67.0 in | Wt 295.0 lb

## 2014-12-13 DIAGNOSIS — F419 Anxiety disorder, unspecified: Secondary | ICD-10-CM

## 2014-12-13 DIAGNOSIS — R600 Localized edema: Secondary | ICD-10-CM | POA: Insufficient documentation

## 2014-12-13 DIAGNOSIS — I10 Essential (primary) hypertension: Secondary | ICD-10-CM

## 2014-12-13 DIAGNOSIS — D509 Iron deficiency anemia, unspecified: Secondary | ICD-10-CM | POA: Diagnosis not present

## 2014-12-13 DIAGNOSIS — M7989 Other specified soft tissue disorders: Secondary | ICD-10-CM

## 2014-12-13 DIAGNOSIS — E785 Hyperlipidemia, unspecified: Secondary | ICD-10-CM

## 2014-12-13 DIAGNOSIS — E1149 Type 2 diabetes mellitus with other diabetic neurological complication: Secondary | ICD-10-CM | POA: Diagnosis not present

## 2014-12-13 DIAGNOSIS — IMO0001 Reserved for inherently not codable concepts without codable children: Secondary | ICD-10-CM

## 2014-12-13 DIAGNOSIS — F32A Depression, unspecified: Secondary | ICD-10-CM

## 2014-12-13 DIAGNOSIS — F329 Major depressive disorder, single episode, unspecified: Secondary | ICD-10-CM

## 2014-12-13 DIAGNOSIS — F418 Other specified anxiety disorders: Secondary | ICD-10-CM

## 2014-12-13 DIAGNOSIS — E559 Vitamin D deficiency, unspecified: Secondary | ICD-10-CM

## 2014-12-13 DIAGNOSIS — Z794 Long term (current) use of insulin: Secondary | ICD-10-CM

## 2014-12-13 DIAGNOSIS — E1165 Type 2 diabetes mellitus with hyperglycemia: Secondary | ICD-10-CM

## 2014-12-13 DIAGNOSIS — E1065 Type 1 diabetes mellitus with hyperglycemia: Secondary | ICD-10-CM

## 2014-12-13 MED ORDER — METOLAZONE 2.5 MG PO TABS: 2.5000 mg | ORAL_TABLET | Freq: Every day | ORAL | Status: DC

## 2014-12-13 NOTE — Patient Instructions (Addendum)
F/u in 2.5 month, call if you need me before  Fasting labs this week please  Pls stop adding salt to food and commit to changes in eating habits to improve your health  You are referred to cardiology in [facility] will be referred to Dr Katy Fitch for diabetic eye exam  Very impotant that you get your colonoscopy, you have promised to do this  At thsi visit  New additional medication  zaroxolyn take every morning to help with leg swelling, continue current fluid meds and potassium as currently doing  Non fast chem 7 and EGFR in 2 weeks   You will be referred for testing of circulation in legs when you return  You are referred to nutrtionist in [place] for help with blood sugar and weight

## 2014-12-15 LAB — COMPLETE METABOLIC PANEL WITH GFR
ALT: 21 U/L (ref 0–35)
AST: 18 U/L (ref 0–37)
Albumin: 4.2 g/dL (ref 3.5–5.2)
Alkaline Phosphatase: 116 U/L (ref 39–117)
BUN: 22 mg/dL (ref 6–23)
CO2: 32 mEq/L (ref 19–32)
Calcium: 9.9 mg/dL (ref 8.4–10.5)
Chloride: 98 mEq/L (ref 96–112)
Creat: 0.91 mg/dL (ref 0.50–1.10)
GFR, Est African American: 73 mL/min
GFR, Est Non African American: 64 mL/min
Glucose, Bld: 93 mg/dL (ref 70–99)
Potassium: 4.2 mEq/L (ref 3.5–5.3)
Sodium: 141 mEq/L (ref 135–145)
Total Bilirubin: 0.4 mg/dL (ref 0.2–1.2)
Total Protein: 7.2 g/dL (ref 6.0–8.3)

## 2014-12-15 LAB — CBC
HCT: 32.5 % — ABNORMAL LOW (ref 36.0–46.0)
Hemoglobin: 10.2 g/dL — ABNORMAL LOW (ref 12.0–15.0)
MCH: 20.9 pg — ABNORMAL LOW (ref 26.0–34.0)
MCHC: 31.4 g/dL (ref 30.0–36.0)
MCV: 66.5 fL — ABNORMAL LOW (ref 78.0–100.0)
MPV: 9.8 fL (ref 8.6–12.4)
Platelets: 256 10*3/uL (ref 150–400)
RBC: 4.89 MIL/uL (ref 3.87–5.11)
RDW: 15.5 % (ref 11.5–15.5)
WBC: 6.3 10*3/uL (ref 4.0–10.5)

## 2014-12-15 LAB — LIPID PANEL
Cholesterol: 154 mg/dL (ref 0–200)
HDL: 67 mg/dL (ref >=46)
LDL Cholesterol: 65 mg/dL (ref 0–99)
Total CHOL/HDL Ratio: 2.3 Ratio
Triglycerides: 108 mg/dL (ref <150)
VLDL: 22 mg/dL (ref 0–40)

## 2014-12-15 LAB — IRON: Iron: 57 ug/dL (ref 42–145)

## 2014-12-15 LAB — FERRITIN: Ferritin: 56 ng/mL (ref 10–291)

## 2014-12-16 LAB — HEMOGLOBIN A1C
Hgb A1c MFr Bld: 8.9 % — ABNORMAL HIGH (ref <5.7)
Mean Plasma Glucose: 209 mg/dL — ABNORMAL HIGH (ref <117)

## 2014-12-16 LAB — VITAMIN D 25 HYDROXY (VIT D DEFICIENCY, FRACTURES): Vit D, 25-Hydroxy: 31 ng/mL (ref 30–100)

## 2014-12-20 ENCOUNTER — Other Ambulatory Visit: Payer: Self-pay | Admitting: Family Medicine

## 2014-12-28 ENCOUNTER — Telehealth: Payer: Self-pay | Admitting: Endocrinology

## 2014-12-28 MED ORDER — INSULIN NPH ISOPHANE & REGULAR (70-30) 100 UNIT/ML ~~LOC~~ SUSP: SUBCUTANEOUS | Status: DC

## 2014-12-28 NOTE — Telephone Encounter (Signed)
[facility] note: Pt states the pharm is having difficulty filling the insulin do to the brand ordered. States insurance is rejecting it. States pharmacy sent a fax today but the MD did not respond.  Advised to call the MD in the AM during reg business hours and speak to the MD nurse for asst with pharm discrepancy on Rx written. Verb understanding.

## 2014-12-28 NOTE — Telephone Encounter (Signed)
Pt called insruance will not pay for new medication ( she does not know the name of medication) so she wants to know what she needs to do and needs refill on her daily medication humalog

## 2014-12-28 NOTE — Telephone Encounter (Signed)
I have contacted [facility] in [place] Multiple time and advised them the pt is not taking Humulin R at this time. At her last office visit the Humulin R was changed to the Humulin 70/30. I advised pharmacy to discontinue the Rx for Humulin R and to fill the Humulin 70/30.

## 2014-12-29 LAB — COMPLETE METABOLIC PANEL WITH GFR
ALT: 22 U/L (ref 0–35)
AST: 19 U/L (ref 0–37)
Albumin: 4.1 g/dL (ref 3.5–5.2)
Alkaline Phosphatase: 117 U/L (ref 39–117)
BUN: 23 mg/dL (ref 6–23)
CO2: 33 mEq/L — ABNORMAL HIGH (ref 19–32)
Calcium: 9.4 mg/dL (ref 8.4–10.5)
Chloride: 99 mEq/L (ref 96–112)
Creat: 1 mg/dL (ref 0.50–1.10)
GFR, Est African American: 65 mL/min
GFR, Est Non African American: 57 mL/min — ABNORMAL LOW
Glucose, Bld: 209 mg/dL — ABNORMAL HIGH (ref 70–99)
Potassium: 4.6 mEq/L (ref 3.5–5.3)
Sodium: 140 mEq/L (ref 135–145)
Total Bilirubin: 0.4 mg/dL (ref 0.2–1.2)
Total Protein: 7.1 g/dL (ref 6.0–8.3)

## 2014-12-29 MED ORDER — INSULIN NPH ISOPHANE & REGULAR (70-30) 100 UNIT/ML ~~LOC~~ SUSP: SUBCUTANEOUS | Status: DC

## 2014-12-29 NOTE — Telephone Encounter (Signed)
Rx sent to pharmacy   

## 2014-12-29 NOTE — Addendum Note (Signed)
Addended by: Moody Bruins E on: 12/29/2014 10:39 AM   Modules accepted: Orders

## 2014-12-29 NOTE — Telephone Encounter (Signed)
ok 

## 2014-12-29 NOTE — Telephone Encounter (Signed)
Received notice, Humulin 70/30 is not covered by her insurance. Can we change to Novoling 70/30? Thanks!

## 2014-12-31 ENCOUNTER — Telehealth: Payer: Self-pay

## 2014-12-31 NOTE — Telephone Encounter (Signed)
Pls have them stop zaroxolyn, advise pt of situation to stop this and take the  3 lasix daily only along with the 2 potassium tabs she has been on for some time, let her know that labs from 04/05 showed normal potassium and kidney function which is good  PLS vERIFY with pharmacy the potassium dose she has been on since [place] she should have had the 3 lasix daily, if not twice daily, then send me back a msg so I can correct in computer.  PLAS send stamped d/c zaroxolyn order to the pharmacy

## 2014-12-31 NOTE — Telephone Encounter (Signed)
AT her 3/21 visit, her legs were still swelling so zaroxlyn was added. [facility] called to let us know of a mistake they had made with filling her lasix. Should have been 3 daily since Dec but they were only giving her two daily. This has been corrected as of today and they want to know if you want to keep her on the zaroxlyn since she is now getting the correct dose of lasix. Please advise

## 2014-12-31 NOTE — Telephone Encounter (Signed)
Laura Atkins at [facility] understands patient is to be on lasix tid and potassium bid and will call and explain the mix up to [place] and that she no longer needs the zarolxlyn

## 2015-01-03 ENCOUNTER — Encounter: Payer: Self-pay | Admitting: Cardiology

## 2015-01-03 ENCOUNTER — Ambulatory Visit (INDEPENDENT_AMBULATORY_CARE_PROVIDER_SITE_OTHER): Payer: PPO | Admitting: Cardiology

## 2015-01-03 VITALS — BP 116/67 | HR 73 | Ht 67.5 in | Wt 288.0 lb

## 2015-01-03 DIAGNOSIS — I10 Essential (primary) hypertension: Secondary | ICD-10-CM | POA: Diagnosis not present

## 2015-01-03 DIAGNOSIS — M7989 Other specified soft tissue disorders: Secondary | ICD-10-CM | POA: Diagnosis not present

## 2015-01-03 DIAGNOSIS — M79606 Pain in leg, unspecified: Secondary | ICD-10-CM

## 2015-01-03 DIAGNOSIS — E785 Hyperlipidemia, unspecified: Secondary | ICD-10-CM | POA: Diagnosis not present

## 2015-01-03 MED ORDER — TORSEMIDE 20 MG PO TABS: 40.0000 mg | ORAL_TABLET | Freq: Two times a day (BID) | ORAL | Status: DC

## 2015-01-03 NOTE — Patient Instructions (Signed)
Your physician has requested that you have an ankle brachial index (ABI). During this test an ultrasound and blood pressure cuff are used to evaluate the arteries that supply the arms and legs with blood. Allow thirty minutes for this exam. There are no restrictions or special instructions. Lab for BMET - due in 2 weeks, around 01/17/2015.   Office will contact with results via phone or letter.   Stop Lasix. Begin Torsemide 40mg  twice a day - new sent to [place] today.  Continue all other medications.   Follow up in  4 weeks

## 2015-01-03 NOTE — Progress Notes (Signed)
Clinical Summary Laura Atkins is a 74 y.o.female last seen by Dr [name], this is our first visit together. She is seen for the following medical problems.    1. Leg swelling - on lasix 80mg  and 40 mg PM, recent increase appoximately 1 month ago by pcp. Swelling has not significantly improved.  - no SOB, DOE, or orthopnea  2. Leg pain - cramping pain in back of legs with exertion. No rest pain, no sores on feet  3. HTN - does not check at home - compliant with meds  4. HL - compliant with statin. Last panel 11/2014 TC 154 TG 108 HDL 67 LDL 65   Past Medical History  Diagnosis Date  . Hypertension   . Diabetes mellitus   . Hyperlipidemia   . Obesity   . Anemia   . Anxiety and depression   . GERD (gastroesophageal reflux disease)   . Pruritus   . Osteoarthritis     Left knee; right shoulder; chronic neck and back pain  . Obstructive sleep apnea   . Urinary incontinence   . Tremor     This started months ago after her seizure progressing to very poor hand writing  . Seizures   . Depression   . Anxiety   . Headache(784.0)      Allergies  Allergen Reactions  . Penicillins Shortness Of Breath, Itching and Rash  . Prednisone Shortness Of Breath, Itching and Rash  . Propoxyphene N-Acetaminophen Itching and Nausea And Vomiting     Current Outpatient Prescriptions  Medication Sig Dispense Refill  . acetaminophen (EXTRA STRENGTH PAIN RELIEF) 500 MG tablet Take 500 mg by mouth every 6 (six) hours as needed for pain.    Marland Kitchen amLODipine (NORVASC) 10 MG tablet TAKE 1 TABLET BY MOUTH EVERY DAY 90 tablet 0  . aspirin 81 MG tablet Take 81 mg by mouth daily.      Marland Kitchen atorvastatin (LIPITOR) 40 MG tablet TAKE 1 TABLET BY MOUTH EVERY DAY 90 tablet 0  . benazepril (LOTENSIN) 20 MG tablet TAKE 1 TABLET BY MOUTH EVERY DAILY 90 tablet 0  . clotrimazole (LOTRIMIN) 1 % cream Apply 1 application topically 2 (two) times daily. 30 g 0  . FLUoxetine (PROZAC) 20 MG capsule Take 1 capsule (20  mg total) by mouth 2 (two) times daily. 60 capsule 3  . furosemide (LASIX) 40 MG tablet Two tablets in the morning at 8am and one in the afternoon at 2pm 90 tablet 5  . gabapentin (NEURONTIN) 400 MG capsule TAKE 1 CAPSULE BY MOUTH THREE TIMES DAILY 90 capsule 3  . glucose blood (ONE TOUCH ULTRA TEST) test strip Four times daily testing  DX E11.9 150 each 2  . hydrOXYzine (VISTARIL) 25 MG capsule TAKE 1 CAPSULE BY MOUTH AT BEDTIME FOR ITCHING -- DO NOT FILL 50MG  DOSE AS PER DR KEB 09-06-14 30 capsule 2  . insulin NPH-regular Human (NOVOLIN 70/30 RELION) (70-30) 100 UNIT/ML injection Inject 35 units with breakfast and 20 units with evening meal. 20 mL 2  . Insulin Syringe-Needle U-100 (B-D INS SYR ULTRAFINE 1CC/30G) 30G X 1/2" 1 ML MISC USE AS DIRECTED 100 each 2  . Multiple Vitamins-Minerals (CENTRUM SILVER ULTRA WOMENS PO) Take 1 tablet by mouth daily.     . mupirocin cream (BACTROBAN) 2 % Apply 1 application topically 2 (two) times daily. Apply to affected naily twice daily for 1 week , then as needed 30 g 0  . omeprazole (PRILOSEC) 20 MG capsule  TAKE 1 CAPSULE BY MOUTH DAILY 90 capsule 0  . potassium chloride SA (K-DUR,KLOR-CON) 20 MEQ tablet Take 1 tablet (20 mEq total) by mouth 2 (two) times daily. 60 tablet 5  . VOLTAREN 1 % GEL APPLY 4 GRAMS TOPICALLY 4 TIMES DAILY. 100 g 0   No current facility-administered medications for this visit.     Past Surgical History  Procedure Laterality Date  . Abdominal hysterectomy    . Breast excisional biopsy      Left; cyst  . Cholecystectomy    . Cataract extraction w/ intraocular lens implant Left 09/07/2013  . Eye surgery Left 09/07/2013    cataract     Allergies  Allergen Reactions  . Penicillins Shortness Of Breath, Itching and Rash  . Prednisone Shortness Of Breath, Itching and Rash  . Propoxyphene N-Acetaminophen Itching and Nausea And Vomiting      Family History  Problem Relation Age of Onset  . ADD / ADHD Grandchild   .  Bipolar disorder Grandchild   . Bipolar disorder Daughter   . Alcohol abuse Neg Hx   . Drug abuse Neg Hx   . Cancer Mother 24    lung   . Kidney disease Father   . Diabetes Sister      Social History Laura Atkins reports that she has never smoked. She has never used smokeless tobacco. Laura Atkins reports that she does not drink alcohol.   Review of Systems CONSTITUTIONAL: No weight loss, fever, chills, weakness or fatigue.  HEENT: Eyes: No visual loss, blurred vision, double vision or yellow sclerae.No hearing loss, sneezing, congestion, runny nose or sore throat.  SKIN: No rash or itching.  CARDIOVASCULAR: per HPI RESPIRATORY: No shortness of breath, cough or sputum.  GASTROINTESTINAL: No anorexia, nausea, vomiting or diarrhea. No abdominal pain or blood.  GENITOURINARY: No burning on urination, no polyuria NEUROLOGICAL: No headache, dizziness, syncope, paralysis, ataxia, numbness or tingling in the extremities. No change in bowel or bladder control.  MUSCULOSKELETAL:leg pains  LYMPHATICS: No enlarged nodes. No history of splenectomy.  PSYCHIATRIC: No history of depression or anxiety.  ENDOCRINOLOGIC: No reports of sweating, cold or heat intolerance. No polyuria or polydipsia.  Marland Kitchen   Physical Examination p 73 bp 116/67 Wt 288 lbs BMI 44 Gen: resting comfortably, no acute distress HEENT: no scleral icterus, pupils equal round and reactive, no palptable cervical adenopathy,  CV: RRR, no m/r/g, no JVD, no carotid bruits Resp: Clear to auscultation bilaterally GI: abdomen is soft, non-tender, non-distended, normal bowel sounds, no hepatosplenomegaly MSK: extremities are warm, 1-2+ bilateral edema Skin: warm, no rash Neuro:  no focal deficits Psych: appropriate affect   Diagnostic Studies  01/2013 echo Study Conclusions  - Left ventricle: The cavity size was normal. There was mild concentric hypertrophy. Systolic function was normal. The estimated ejection fraction was in  the range of 60% to 65%. Wall motion was normal; there were no regional wall motion abnormalities. - Aortic valve: Mildly calcified annulus. - Right ventricle: The cavity size was normal. Wall thickness was mildly increased. - Atrial septum: No defect or patent foramen ovale was identified.  01/2013 MPI IMPRESSION: Probably negative pharmacologic stress nuclear myocardial study revealing normal left ventricular size, normal left ventricular systolic function and no significant stress-induced EKG abnormalities. On scintigraphic imaging, there was a small and mild defect that likely represents variable breast attenuation; however, the possibility of modest anterior ischemia cannot be unequivocally excluded.  01/2013 Event monitor No significant arrhythmias  Assessment and Plan  1.  Chronic LE edema - echo 01/2013 with normal LVEF, diastolic function not reported but fairly normal parameters - not responding to oral lasix, will change to torsemide 40mg  bid. Check BMET in 2 weeks  2. Leg pain - will check ABIs  3. HTN - at goal, continue current meds  4. Hyperlipidemia - at goal, continue current meds   F/u 1 month     Arnoldo Lenis, M.D

## 2015-01-05 NOTE — Addendum Note (Signed)
Addended by: Julian Hy T on: 01/05/2015 12:32 PM   Modules accepted: Orders

## 2015-01-07 ENCOUNTER — Ambulatory Visit (HOSPITAL_COMMUNITY): Payer: Self-pay | Admitting: Psychiatry

## 2015-01-10 ENCOUNTER — Ambulatory Visit: Payer: Self-pay | Admitting: Family Medicine

## 2015-01-13 ENCOUNTER — Ambulatory Visit: Payer: Self-pay | Admitting: Endocrinology

## 2015-01-20 ENCOUNTER — Encounter (INDEPENDENT_AMBULATORY_CARE_PROVIDER_SITE_OTHER): Payer: PPO

## 2015-01-20 ENCOUNTER — Ambulatory Visit (INDEPENDENT_AMBULATORY_CARE_PROVIDER_SITE_OTHER): Payer: PPO | Admitting: Endocrinology

## 2015-01-20 ENCOUNTER — Encounter: Payer: Self-pay | Admitting: Endocrinology

## 2015-01-20 VITALS — BP 130/60 | HR 74 | Temp 98.1°F | Ht 67.5 in | Wt 286.0 lb

## 2015-01-20 DIAGNOSIS — E1149 Type 2 diabetes mellitus with other diabetic neurological complication: Secondary | ICD-10-CM

## 2015-01-20 DIAGNOSIS — M79606 Pain in leg, unspecified: Secondary | ICD-10-CM | POA: Diagnosis not present

## 2015-01-20 NOTE — Patient Instructions (Addendum)
check your blood sugar 4 times a day: before the 3 meals, and at bedtime.  also check if you have symptoms of your blood sugar being too high or too low.  please keep a record of the readings and bring it to your next appointment here.  You can write it on any piece of paper.  please call us sooner if your blood sugar goes below 70, or if you have a lot of readings over 200.   Please continue the same insulin.   On this type of insulin schedule, you should eat meals on a regular schedule.  If a meal is missed or significantly delayed, your blood sugar could go low. Please come back for a follow-up appointment in 2 months.

## 2015-01-20 NOTE — Progress Notes (Signed)
Subjective:    Patient ID: Laura Atkins, female    DOB: 08-Oct-1940, 74 y.o.   MRN: [id number]  HPI Pt returns for f/u of diabetes mellitus: DM type: insulin-requiring type 2 Dx'ed: [id number] Complications: polyneuropathy, PAD, and foot ulcer.   Therapy: insulin since 2005 GDM: never DKA: never Severe hypoglycemia: never Pancreatitis: never Other info: in early 2016, she changed to BID premixed insulin, due to poor results with multiple daily injections   Interval history: Pt says she never misses the insulin doses (she started 70/30 on 01/05/15).  she brings a record of her cbg's which i have reviewed today.  It varies from 76-200's.  It is highest at hs, and lower at other times of day. Past Medical History  Diagnosis Date  . Hypertension   . Diabetes mellitus   . Hyperlipidemia   . Obesity   . Anemia   . Anxiety and depression   . GERD (gastroesophageal reflux disease)   . Pruritus   . Osteoarthritis     Left knee; right shoulder; chronic neck and back pain  . Obstructive sleep apnea   . Urinary incontinence   . Tremor     This started months ago after her seizure progressing to very poor hand writing  . Seizures   . Depression   . Anxiety   . [id number])     Past Surgical History  Procedure Laterality Date  . Abdominal hysterectomy    . Breast excisional biopsy      Left; cyst  . Cholecystectomy    . Cataract extraction w/ intraocular lens implant Left 09/07/2013  . Eye surgery Left 09/07/2013    cataract    History   Social History  . Marital Status: Married    Spouse Name: saunders  . Number of Children: 5  . Years of Education: 12   Occupational History  . retired     retired   Social History Main Topics  . Smoking status: Never Smoker   . Smokeless tobacco: Never Used  . Alcohol Use: No  . Drug Use: No  . Sexual Activity: Yes    Birth Control/ Protection: Surgical   Other Topics Concern  . Not on file   Social History Narrative   Patient lives at home with her husband [name].  Patient is retired.    Right handed.    Five Children.    Caffeine- 2 daily    Current Outpatient Prescriptions on File Prior to Visit  Medication Sig Dispense Refill  . acetaminophen (EXTRA STRENGTH PAIN RELIEF) 500 MG tablet Take 500 mg by mouth every 6 (six) hours as needed for pain.    Marland Kitchen amLODipine (NORVASC) 10 MG tablet TAKE 1 TABLET BY MOUTH EVERY DAY 90 tablet 0  . aspirin 81 MG tablet Take 81 mg by mouth daily.      Marland Kitchen atorvastatin (LIPITOR) 40 MG tablet TAKE 1 TABLET BY MOUTH EVERY DAY 90 tablet 0  . benazepril (LOTENSIN) 20 MG tablet TAKE 1 TABLET BY MOUTH EVERY DAILY 90 tablet 0  . clotrimazole (LOTRIMIN) 1 % cream Apply 1 application topically 2 (two) times daily. 30 g 0  . FLUoxetine (PROZAC) 20 MG capsule Take 1 capsule (20 mg total) by mouth 2 (two) times daily. 60 capsule 3  . gabapentin (NEURONTIN) 400 MG capsule TAKE 1 CAPSULE BY MOUTH THREE TIMES DAILY 90 capsule 3  . glucose blood (ONE TOUCH ULTRA TEST) test strip Four times daily testing  DX E11.9 150 each  2  . hydrOXYzine (VISTARIL) 25 MG capsule TAKE 1 CAPSULE BY MOUTH AT BEDTIME FOR ITCHING -- DO NOT FILL 50MG  DOSE AS PER DR KEB 09-06-14 30 capsule 2  . insulin NPH-regular Human (NOVOLIN 70/30 RELION) (70-30) 100 UNIT/ML injection Inject 35 units with breakfast and 20 units with evening meal. 20 mL 2  . Insulin Syringe-Needle U-100 (B-D INS SYR ULTRAFINE 1CC/30G) 30G X 1/2" 1 ML MISC USE AS DIRECTED 100 each 2  . Multiple Vitamins-Minerals (CENTRUM SILVER ULTRA WOMENS PO) Take 1 tablet by mouth daily.     . mupirocin cream (BACTROBAN) 2 % Apply 1 application topically 2 (two) times daily. Apply to affected naily twice daily for 1 week , then as needed 30 g 0  . omeprazole (PRILOSEC) 20 MG capsule TAKE 1 CAPSULE BY MOUTH DAILY 90 capsule 0  . potassium chloride SA (K-DUR,KLOR-CON) 20 MEQ tablet Take 1 tablet (20 mEq total) by mouth 2 (two) times daily. 60 tablet 5  .  torsemide (DEMADEX) 20 MG tablet Take 2 tablets (40 mg total) by mouth 2 (two) times daily. 120 tablet 6  . VOLTAREN 1 % GEL APPLY 4 GRAMS TOPICALLY 4 TIMES DAILY. 100 g 0   No current facility-administered medications on file prior to visit.    Allergies  Allergen Reactions  . Penicillins Shortness Of Breath, Itching and Rash  . Prednisone Shortness Of Breath, Itching and Rash  . Propoxyphene N-Acetaminophen Itching and Nausea And Vomiting    Family History  Problem Relation Age of Onset  . ADD / ADHD Grandchild   . Bipolar disorder Grandchild   . Bipolar disorder Daughter   . Alcohol abuse Neg Hx   . Drug abuse Neg Hx   . Cancer Mother 66    lung   . Kidney disease Father   . Diabetes Sister     BP 130/60 mmHg  Pulse 74  Temp(Src) 98.1 F (36.7 C) (Oral)  Ht 5' 7.5" (1.715 m)  Wt 286 lb (129.729 kg)  BMI 44.11 kg/m2  SpO2 97%  Review of Systems She denies hypoglycemia and weight change.    Objective:   Physical Exam VITAL SIGNS:  See vs page GENERAL: no distress SKIN:  Insulin injection sites at the anterior abdomen are normal   Lab Results  Component Value Date   HGBA1C 8.9* 12/13/2014       Assessment & Plan:  DM: glycemic control is apparently improved.  Patient is advised the following: Patient Instructions  check your blood sugar 4 times a day: before the 3 meals, and at bedtime.  also check if you have symptoms of your blood sugar being too high or too low.  please keep a record of the readings and bring it to your next appointment here.  You can write it on any piece of paper.  please call us sooner if your blood sugar goes below 70, or if you have a lot of readings over 200.   Please continue the same insulin.   On this type of insulin schedule, you should eat meals on a regular schedule.  If a meal is missed or significantly delayed, your blood sugar could go low. Please come back for a follow-up appointment in 2 months.

## 2015-01-23 NOTE — Progress Notes (Signed)
Subjective:    Patient ID: Laura Atkins, female    DOB: 21-Mar-1941, 74 y.o.   MRN: [id number]  HPI Pt in with main concern of leg swelling, she denies pND or orthopnea Activity is limited due to severe osteoarthritis She is gaining weight and states her blood sugar is not as good as it had been Denies polyuria, polydipsia, blurred vision , or hypoglycemic episodes.    Review of Systems See HPI Denies recent fever or chills. Denies sinus pressure, nasal congestion, ear pain or sore throat. Denies chest congestion, productive cough or wheezing. Denies chest pains, palpitations and leg swelling Denies abdominal pain, nausea, vomiting,diarrhea or constipation.   Denies dysuria, frequency, hesitancy or incontinence. C/o chronic  joint pain, swelling and limitation in mobility. Denies headaches, seizures, numbness, or tingling. Denies uncontrolled  depression, anxiety or insomnia. Denies skin break down or rash.        Objective:   Physical Exam BP 138/64 mmHg  Pulse 82  Resp 18  Ht 5\' 7"  (1.702 m)  Wt 295 lb (133.811 kg)  BMI 46.19 kg/m2  SpO2 96% Patient alert and oriented and in no cardiopulmonary distress.  HEENT: No facial asymmetry, EOMI,   oropharynx pink and moist.  Neck decreased though adequate  no JVD, no mass.  Chest: Clear to auscultation bilaterally.  CVS: S1, S2 no murmurs, no S3.Regular rate.  ABD: Soft non tender.   Ext: two plus pitting  edema  MS: Decreased ROM spine, shoulders, hips and knees.  Skin: Intact, no ulcerations or rash noted.  Psych: Good eye contact, normal affect. Memory intact not anxious or depressed appearing.  CNS: CN 2-12 intact, power,  normal throughout.no focal deficits noted.        Assessment & Plan:  Bilateral leg edema Add zaroxolyn daily for as needed use to current lasix regime F/u lab this week to ensure renal status is stable   Anxiety and depression Well controlled and followed by  psychiatry   Diabetes mellitus, insulin dependent (IDDM), uncontrolled  Deteriorated, with weight gain, refer to local diabetic educator.Continue with endo as before. Patient educated about the importance of limiting  Carbohydrate intake , the need to commit to daily physical activity for a minimum of 30 minutes , and to commit weight loss. Also following diet and meds prescribed and regular testing   Diabetic Labs Latest Ref Rng 12/28/2014 12/13/2014 08/18/2014 05/10/2014 04/21/2014  HbA1c <5.7 % - 8.9(H) 8.0(H) 8.6(H) -  Microalbumin 0.00 - 1.89 mg/dL - - - - -  Micro/Creat Ratio 0.0 - 30.0 mg/g - - - - -  Chol 0 - 200 mg/dL - 154 - - 131  HDL >=46 mg/dL - 67 - - 56  Calc LDL 0 - 99 mg/dL - 65 - - 59  Triglycerides <150 mg/dL - 108 - - 80  Creatinine 0.50 - 1.10 mg/dL 1.00 0.91 0.87 - -   BP/Weight 01/20/2015 01/03/2015 12/13/2014 10/14/2014 09/18/2014 09/06/2014 [id number]  Systolic BP [id number] [id number] [id number]  Diastolic BP 60 67 64 70 55 62 60  Wt. (Lbs) 286 288 295 287 284 284 283  BMI 44.11 44.42 46.19 44.94 44.47 43.19 43.04  Some encounter information is confidential and restricted. Go to Review Flowsheets activity to see all data.   Foot/eye exam completion dates Latest Ref Rng 10/14/2014 09/06/2014  Eye Exam No Retinopathy - -  Foot Form Completion - Done Done        Essential hypertension Controlled, no  change in medication    Morbid obesity Deteriorated.Refewr to nutrtionist Patient re-educated about  the importance of commitment to a  minimum of 150 minutes of exercise per week.  The importance of healthy food choices with portion control discussed. Encouraged to start a food diary, count calories and to consider  joining a support group. Sample diet sheets offered. Goals set by the patient for the next several months.   Weight /BMI 01/20/2015 01/03/2015 12/13/2014  WEIGHT 286 lb 288 lb 295 lb  HEIGHT 5' 7.5" 5' 7.5" 5\' 7"   BMI 44.11 kg/m2 44.42 kg/m2 46.19 kg/m2   Some encounter information is confidential and restricted. Go to Review Flowsheets activity to see all data.    Current exercise per week 40 minutes.

## 2015-01-23 NOTE — Assessment & Plan Note (Signed)
Deteriorated.Refewr to nutrtionist Patient re-educated about  the importance of commitment to a  minimum of 150 minutes of exercise per week.  The importance of healthy food choices with portion control discussed. Encouraged to start a food diary, count calories and to consider  joining a support group. Sample diet sheets offered. Goals set by the patient for the next several months.   Weight /BMI 01/20/2015 01/03/2015 12/13/2014  WEIGHT 286 lb 288 lb 295 lb  HEIGHT 5' 7.5" 5' 7.5" 5\' 7"   BMI 44.11 kg/m2 44.42 kg/m2 46.19 kg/m2  Some encounter information is confidential and restricted. Go to Review Flowsheets activity to see all data.    Current exercise per week 40 minutes.

## 2015-01-23 NOTE — Assessment & Plan Note (Signed)
Improving, followed by endo Denies polyuria, polydipsia, blurred vision , or hypoglycemic episodes.

## 2015-01-23 NOTE — Assessment & Plan Note (Signed)
Dose increase on lasix , with increased potassium to cover

## 2015-01-23 NOTE — Assessment & Plan Note (Signed)
Well controlled and followed by psychiatry

## 2015-01-23 NOTE — Assessment & Plan Note (Signed)
Hyperlipidemia:Low fat diet discussed and encouraged. Controlled, no change in medication    Lipid Panel  Lab Results  Component Value Date   CHOL 154 12/13/2014   HDL 67 12/13/2014   LDLCALC 65 12/13/2014   TRIG 108 12/13/2014   CHOLHDL 2.3 12/13/2014

## 2015-01-23 NOTE — Assessment & Plan Note (Signed)
Hydroxyzine prescribed for symptom control, pt warned re sedative s/e

## 2015-01-23 NOTE — Assessment & Plan Note (Signed)
Improved markedly, now being treated by psychiatry

## 2015-01-23 NOTE — Assessment & Plan Note (Signed)
Controlled, no change in medication  

## 2015-01-23 NOTE — Assessment & Plan Note (Signed)
Add zaroxolyn daily for as needed use to current lasix regime F/u lab this week to ensure renal status is stable

## 2015-01-23 NOTE — Assessment & Plan Note (Addendum)
  Deteriorated, with weight gain, refer to local diabetic educator.Continue with endo as before. Patient educated about the importance of limiting  Carbohydrate intake , the need to commit to daily physical activity for a minimum of 30 minutes , and to commit weight loss. Also following diet and meds prescribed and regular testing   Diabetic Labs Latest Ref Rng 12/28/2014 12/13/2014 08/18/2014 05/10/2014 04/21/2014  HbA1c <5.7 % - 8.9(H) 8.0(H) 8.6(H) -  Microalbumin 0.00 - 1.89 mg/dL - - - - -  Micro/Creat Ratio 0.0 - 30.0 mg/g - - - - -  Chol 0 - 200 mg/dL - 154 - - 131  HDL >=46 mg/dL - 67 - - 56  Calc LDL 0 - 99 mg/dL - 65 - - 59  Triglycerides <150 mg/dL - 108 - - 80  Creatinine 0.50 - 1.10 mg/dL 1.00 0.91 0.87 - -   BP/Weight 01/20/2015 01/03/2015 12/13/2014 10/14/2014 09/18/2014 09/06/2014 [id number]  Systolic BP [id number] Q000111Q 123456 Q000111Q  Diastolic BP 60 67 64 70 55 62 60  Wt. (Lbs) 286 288 295 287 284 284 283  BMI 44.11 44.42 46.19 44.94 44.47 43.19 43.04  Some encounter information is confidential and restricted. Go to Review Flowsheets activity to see all data.   Foot/eye exam completion dates Latest Ref Rng 10/14/2014 09/06/2014  Eye Exam No Retinopathy - -  Foot Form Completion - Done Done

## 2015-01-23 NOTE — Assessment & Plan Note (Signed)
Deteriorated. Patient re-educated about  the importance of commitment to a  minimum of 150 minutes of exercise per week.  The importance of healthy food choices with portion control discussed. Encouraged to start a food diary, count calories and to consider  joining a support group. Sample diet sheets offered. Goals set by the patient for the next several months.   Weight /BMI 01/20/2015 01/03/2015 12/13/2014  WEIGHT 286 lb 288 lb 295 lb  HEIGHT 5' 7.5" 5' 7.5" 5\' 7"   BMI 44.11 kg/m2 44.42 kg/m2 46.19 kg/m2  Some encounter information is confidential and restricted. Go to Review Flowsheets activity to see all data.    Current exercise per week 60 minutes.

## 2015-01-26 ENCOUNTER — Telehealth: Payer: Self-pay | Admitting: *Deleted

## 2015-01-26 NOTE — Telephone Encounter (Signed)
-----   Message from Arnoldo Lenis, MD sent at 01/25/2015  4:30 PM EDT ----- ABIs show normal circulation in her legs. Her leg pain is not related to circulation, can discuss further with her pcp  Zandra Abts MD

## 2015-01-26 NOTE — Telephone Encounter (Signed)
Pt made aware, forwarded to Dr. Moshe Cipro

## 2015-02-07 ENCOUNTER — Encounter: Payer: PPO | Attending: "Endocrinology | Admitting: Nutrition

## 2015-02-07 ENCOUNTER — Encounter: Payer: Self-pay | Admitting: Nutrition

## 2015-02-07 VITALS — Ht 67.0 in | Wt 289.0 lb

## 2015-02-07 DIAGNOSIS — E118 Type 2 diabetes mellitus with unspecified complications: Secondary | ICD-10-CM | POA: Diagnosis not present

## 2015-02-07 DIAGNOSIS — E1165 Type 2 diabetes mellitus with hyperglycemia: Secondary | ICD-10-CM

## 2015-02-07 DIAGNOSIS — Z794 Long term (current) use of insulin: Secondary | ICD-10-CM | POA: Diagnosis not present

## 2015-02-07 DIAGNOSIS — Z713 Dietary counseling and surveillance: Secondary | ICD-10-CM | POA: Diagnosis not present

## 2015-02-07 DIAGNOSIS — Z6841 Body Mass Index (BMI) 40.0 and over, adult: Secondary | ICD-10-CM | POA: Diagnosis not present

## 2015-02-07 DIAGNOSIS — IMO0002 Reserved for concepts with insufficient information to code with codable children: Secondary | ICD-10-CM

## 2015-02-07 NOTE — Patient Instructions (Addendum)
  Goals: 1. Eat thee balanced meals based on Plate Method. 2. Increase fresh fruits and vegetables. 3. Cut out sweets, cakes, cookies, desserts and juices and SODA!! 4. Drink only water. 5. Avoid snacks between meals 6. Eat on schedule as discussed. 7. Get A1C down to 7.5% in three months. 8. Lose 1 lb per week. 9. Try water aerobics or exercise at the [facility] three times per week for 30 minutes each.

## 2015-02-07 NOTE — Progress Notes (Signed)
  Medical Nutrition Therapy:  Appt start time: 0930 end time:  1030.  Assessment:  Primary concerns today: Diabetess. Lives with her husband. Her husband is with her today. Most food are fried and some baked. Most A1c 8.9%. Phyical activity; Not much. Use to go to the [facility] and willing to try to go back. Depends on transportation.Linus Mako with a walker. Complains of feeling tired, no energy. Watches TV most of the time. Admits to late night eating and snack and sleeping in late. Doesn't drive.  BS Log brought in shows BS 200-300's most of the time in the evenings. Fasting BS 90-202 mg/dl. Meds: Humulin N 30 units at night, Humalog 70/30 35 units am and 20 units pm.  Preferred Learning Style:   Auditory  Visual  Hands on  No preference indicated   Learning Readiness:   Says she is ready.  Contemplating  MEDICATIONS: See list   DIETARY INTAKE:  24-hr recall:  B ( AM): Oatmeals with raisin, 1 slice bread and 1 slice bacon or BIscuit from [employer] , water, Regular [place] Snk ( AM): none L ( PM): Toss salad with [employer] dfressing and baked salmon, Coke Snk ( PM): none, water D ( PM): Gingerbread, 2 slices,  Snk ( PM): gram crackers, or fruit cups, OR nabs Beverages: water, juice, soda,   Usual physical activity:   Estimated energy needs: 1500 calories 170 g carbohydrates 112 g protein 42 g fat  Progress Towards Goal(s):  In progress.   Nutritional Diagnosis:  NB-1.2 Harmful beliefs/attitudes about food or nutrition-related topics (use with caution) As related to Diabetes.  As evidenced by A1C 8.9% .    Intervention:  Nutrition counseling and diabetes education provided on low fat high fiber low sodium diet, CHO Counting, Meal planning, portion sizes, My Plate, target ranges for blood sugars,  Exercise and benefits of weight loss and complications related to uncontrolled DM.  Goals: 1. Eat thee balanced meals based on Plate Method. 2. Increase fresh fruits and  vegetables. 3. Cut out sweets, cakes, cookies, desserts and juices and SODA!! 4. Drink only water. 5. Avoid snacks between meals 6. Eat on schedule as discussed. 7. Get A1C down to 7.5% in three months. 8. Lose 1 lb per week. 9. Try water aerobics or exercise at the [facility] three times per week for 30 minutes each.   Teaching Method Utilized:  Visual Auditory Hands on  Handouts given during visit include:  The Plate Method  The Meal Plan Card  Diabetes Instructions  Barriers to learning/adherence to lifestyle change: None  Demonstrated degree of understanding via:  Teach Back   Monitoring/Evaluation:  Dietary intake, exercise, meal planning, SBG, and body weight in 1 month.

## 2015-02-08 ENCOUNTER — Ambulatory Visit (INDEPENDENT_AMBULATORY_CARE_PROVIDER_SITE_OTHER): Payer: PPO | Admitting: Ophthalmology

## 2015-02-08 DIAGNOSIS — E11339 Type 2 diabetes mellitus with moderate nonproliferative diabetic retinopathy without macular edema: Secondary | ICD-10-CM | POA: Diagnosis not present

## 2015-02-08 DIAGNOSIS — E11331 Type 2 diabetes mellitus with moderate nonproliferative diabetic retinopathy with macular edema: Secondary | ICD-10-CM

## 2015-02-08 DIAGNOSIS — I10 Essential (primary) hypertension: Secondary | ICD-10-CM

## 2015-02-08 DIAGNOSIS — H43813 Vitreous degeneration, bilateral: Secondary | ICD-10-CM

## 2015-02-08 DIAGNOSIS — E11311 Type 2 diabetes mellitus with unspecified diabetic retinopathy with macular edema: Secondary | ICD-10-CM

## 2015-02-08 DIAGNOSIS — H35033 Hypertensive retinopathy, bilateral: Secondary | ICD-10-CM

## 2015-02-11 ENCOUNTER — Encounter (HOSPITAL_COMMUNITY): Payer: Self-pay | Admitting: Psychiatry

## 2015-02-11 ENCOUNTER — Ambulatory Visit (INDEPENDENT_AMBULATORY_CARE_PROVIDER_SITE_OTHER): Payer: PPO | Admitting: Psychiatry

## 2015-02-11 VITALS — BP 124/60 | Ht 67.0 in | Wt 287.0 lb

## 2015-02-11 DIAGNOSIS — F329 Major depressive disorder, single episode, unspecified: Secondary | ICD-10-CM

## 2015-02-11 DIAGNOSIS — F32A Depression, unspecified: Secondary | ICD-10-CM

## 2015-02-11 MED ORDER — FLUOXETINE HCL 20 MG PO CAPS: 20.0000 mg | ORAL_CAPSULE | Freq: Two times a day (BID) | ORAL | Status: DC
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
## 2015-02-15 ENCOUNTER — Ambulatory Visit (INDEPENDENT_AMBULATORY_CARE_PROVIDER_SITE_OTHER): Payer: PPO | Admitting: Cardiology

## 2015-02-15 ENCOUNTER — Encounter: Payer: Self-pay | Admitting: Cardiology

## 2015-02-15 VITALS — BP 124/69 | HR 74 | Ht 67.0 in | Wt 285.8 lb

## 2015-02-15 DIAGNOSIS — R0989 Other specified symptoms and signs involving the circulatory and respiratory systems: Secondary | ICD-10-CM | POA: Diagnosis not present

## 2015-02-15 DIAGNOSIS — M7989 Other specified soft tissue disorders: Secondary | ICD-10-CM

## 2015-02-15 DIAGNOSIS — E785 Hyperlipidemia, unspecified: Secondary | ICD-10-CM

## 2015-02-15 DIAGNOSIS — M79606 Pain in leg, unspecified: Secondary | ICD-10-CM | POA: Diagnosis not present

## 2015-02-15 DIAGNOSIS — I10 Essential (primary) hypertension: Secondary | ICD-10-CM | POA: Diagnosis not present

## 2015-02-15 NOTE — Patient Instructions (Signed)
Your physician wants you to follow-up in: [place] DR. BRANCH You will receive a reminder letter in the mail two months in advance. If you don't receive a letter, please call our office to schedule the follow-up appointment.  Your physician recommends that you continue on your current medications as directed. Please refer to the Current Medication list given to you today.   Your physician has requested that you have a carotid duplex. This test is an ultrasound of the carotid arteries in your neck. It looks at blood flow through these arteries that supply the brain with blood. Allow one hour for this exam. There are no restrictions or special instructions.  WE HAVE GIVEN YOU A PRESCRIPTION FOR COMPRESSION STOCKINGS  WE WILL REQUEST LABS FROM [facility]   Thank you for choosing [facility]!!

## 2015-02-15 NOTE — Progress Notes (Signed)
Clinical Summary Ms. Kintigh is a 74 y.o.female seen today for follow up of the following medical problems.   1. Leg swelling - last visit changed to torsemide 40mg  bid. Since changing to toresmide has had increased output and improvement in leg swelling. Weight down 3 lbs.   2. Leg pain - cramping pain in back of legs with exertion. No rest pain, no sores on feet - ABIs 12/2014 were normal  3. HTN - does not check at home - compliant with meds  4. HL - compliant with statin. Last panel 11/2014 TC 154 TG 108 HDL 67 LDL 65 Past Medical History  Diagnosis Date  . Hypertension   . Diabetes mellitus   . Hyperlipidemia   . Obesity   . Anemia   . Anxiety and depression   . GERD (gastroesophageal reflux disease)   . Pruritus   . Osteoarthritis     Left knee; right shoulder; chronic neck and back pain  . Obstructive sleep apnea   . Urinary incontinence   . Tremor     This started months ago after her seizure progressing to very poor hand writing  . Seizures   . Depression   . Anxiety   . Headache(784.0)      Allergies  Allergen Reactions  . Penicillins Shortness Of Breath, Itching and Rash  . Prednisone Shortness Of Breath, Itching and Rash  . Propoxyphene N-Acetaminophen Itching and Nausea And Vomiting     Current Outpatient Prescriptions  Medication Sig Dispense Refill  . acetaminophen (EXTRA STRENGTH PAIN RELIEF) 500 MG tablet Take 500 mg by mouth every 6 (six) hours as needed for pain.    Marland Kitchen amLODipine (NORVASC) 10 MG tablet TAKE 1 TABLET BY MOUTH EVERY DAY 90 tablet 0  . aspirin 81 MG tablet Take 81 mg by mouth daily.      Marland Kitchen atorvastatin (LIPITOR) 40 MG tablet TAKE 1 TABLET BY MOUTH EVERY DAY 90 tablet 0  . benazepril (LOTENSIN) 20 MG tablet TAKE 1 TABLET BY MOUTH EVERY DAILY 90 tablet 0  . clotrimazole (LOTRIMIN) 1 % cream Apply 1 application topically 2 (two) times daily. 30 g 0  . FLUoxetine (PROZAC) 20 MG capsule Take 1 capsule (20 mg total) by mouth 2 (two)  times daily. 60 capsule 3  . gabapentin (NEURONTIN) 400 MG capsule TAKE 1 CAPSULE BY MOUTH THREE TIMES DAILY 90 capsule 3  . glucose blood (ONE TOUCH ULTRA TEST) test strip Four times daily testing  DX E11.9 150 each 2  . hydrOXYzine (VISTARIL) 25 MG capsule TAKE 1 CAPSULE BY MOUTH AT BEDTIME FOR ITCHING -- DO NOT FILL 50MG  DOSE AS PER DR KEB 09-06-14 (Patient not taking: Reported on 02/07/2015) 30 capsule 2  . insulin NPH Human (HUMULIN N,NOVOLIN N) 100 UNIT/ML injection Inject 30 Units into the skin.    Marland Kitchen insulin NPH-regular Human (NOVOLIN 70/30 RELION) (70-30) 100 UNIT/ML injection Inject 35 units with breakfast and 20 units with evening meal. 20 mL 2  . Insulin Syringe-Needle U-100 (B-D INS SYR ULTRAFINE 1CC/30G) 30G X 1/2" 1 ML MISC USE AS DIRECTED 100 each 2  . Multiple Vitamins-Minerals (CENTRUM SILVER ULTRA WOMENS PO) Take 1 tablet by mouth daily.     . mupirocin cream (BACTROBAN) 2 % Apply 1 application topically 2 (two) times daily. Apply to affected naily twice daily for 1 week , then as needed (Patient not taking: Reported on 02/07/2015) 30 g 0  . omeprazole (PRILOSEC) 20 MG capsule TAKE 1  CAPSULE BY MOUTH DAILY 90 capsule 0  . potassium chloride SA (K-DUR,KLOR-CON) 20 MEQ tablet Take 1 tablet (20 mEq total) by mouth 2 (two) times daily. 60 tablet 5  . torsemide (DEMADEX) 20 MG tablet Take 2 tablets (40 mg total) by mouth 2 (two) times daily. 120 tablet 6  . VOLTAREN 1 % GEL APPLY 4 GRAMS TOPICALLY 4 TIMES DAILY. 100 g 0   No current facility-administered medications for this visit.     Past Surgical History  Procedure Laterality Date  . Abdominal hysterectomy    . Breast excisional biopsy      Left; cyst  . Cholecystectomy    . Cataract extraction w/ intraocular lens implant Left 09/07/2013  . Eye surgery Left 09/07/2013    cataract     Allergies  Allergen Reactions  . Penicillins Shortness Of Breath, Itching and Rash  . Prednisone Shortness Of Breath, Itching and Rash    . Propoxyphene N-Acetaminophen Itching and Nausea And Vomiting      Family History  Problem Relation Age of Onset  . ADD / ADHD Grandchild   . Bipolar disorder Grandchild   . Bipolar disorder Daughter   . Alcohol abuse Neg Hx   . Drug abuse Neg Hx   . Cancer Mother 90    lung   . Kidney disease Father   . Diabetes Sister      Social History Ms. Victoria reports that she has never smoked. She has never used smokeless tobacco. Ms. Dvorkin reports that she does not drink alcohol.   Review of Systems CONSTITUTIONAL: No weight loss, fever, chills, weakness or fatigue.  HEENT: Eyes: No visual loss, blurred vision, double vision or yellow sclerae.No hearing loss, sneezing, congestion, runny nose or sore throat.  SKIN: No rash or itching.  CARDIOVASCULAR: per HPI RESPIRATORY: No shortness of breath, cough or sputum.  GASTROINTESTINAL: No anorexia, nausea, vomiting or diarrhea. No abdominal pain or blood.  GENITOURINARY: No burning on urination, no polyuria NEUROLOGICAL: No headache, dizziness, syncope, paralysis, ataxia, numbness or tingling in the extremities. No change in bowel or bladder control.  MUSCULOSKELETAL: No muscle, back pain, joint pain or stiffness.  LYMPHATICS: No enlarged nodes. No history of splenectomy.  PSYCHIATRIC: No history of depression or anxiety.  ENDOCRINOLOGIC: No reports of sweating, cold or heat intolerance. No polyuria or polydipsia.  Marland Kitchen   Physical Examination Filed Vitals:   02/15/15 1430  BP: 124/69  Pulse: 74   Filed Vitals:   02/15/15 1430  Height: 5\' 7"  (1.702 m)  Weight: 285 lb 12.8 oz (129.638 kg)    Gen: resting comfortably, no acute distress HEENT: no scleral icterus, pupils equal round and reactive, no palptable cervical adenopathy,  CV: RRR, 2/6 systolic murmur RUSB, no JVD, + left carotid bruit Resp: Clear to auscultation bilaterally GI: abdomen is soft, non-tender, non-distended, normal bowel sounds, no hepatosplenomegaly MSK:  extremities are warm, 1+ bilateral LE edema Skin: warm, no rash Neuro:  no focal deficits Psych: appropriate affect   Diagnostic Studies  01/2013 echo Study Conclusions  - Left ventricle: The cavity size was normal. There was mild concentric hypertrophy. Systolic function was normal. The estimated ejection fraction was in the range of 60% to 65%. Wall motion was normal; there were no regional wall motion abnormalities. - Aortic valve: Mildly calcified annulus. - Right ventricle: The cavity size was normal. Wall thickness was mildly increased. - Atrial septum: No defect or patent foramen ovale was identified.  01/2013 MPI IMPRESSION: Probably negative pharmacologic  stress nuclear myocardial study revealing normal left ventricular size, normal left ventricular systolic function and no significant stress-induced EKG abnormalities. On scintigraphic imaging, there was a small and mild defect that likely represents variable breast attenuation; however, the possibility of modest anterior ischemia cannot be unequivocally excluded.  01/2013 Event monitor No significant arrhythmias  12/2014 ABIs: R 1.10 L 1.15  Assessment and Plan   1. Chronic LE edema - echo 01/2013 with normal LVEF, diastolic function not reported but fairly normal parameters - symptoms improved with change to torsemide. She reports she had repeat labs done at [facility] since that time but do not see in system, we will call lab  2. Leg pain - normal ABIs, defer further workup to pcp  3. HTN - at goal, continue current meds  4. Hyperlipidemia - at goal, continue current meds  5. Carotid bruit - obtain carotid US   Arnoldo Lenis, M.D.

## 2015-02-22 ENCOUNTER — Ambulatory Visit (INDEPENDENT_AMBULATORY_CARE_PROVIDER_SITE_OTHER): Payer: PPO

## 2015-02-22 DIAGNOSIS — N3001 Acute cystitis with hematuria: Secondary | ICD-10-CM

## 2015-02-22 LAB — POCT URINALYSIS DIPSTICK
Bilirubin, UA: NEGATIVE
Glucose, UA: NEGATIVE
Ketones, UA: NEGATIVE
Nitrite, UA: POSITIVE
Protein, UA: NEGATIVE
Spec Grav, UA: 1.02
Urobilinogen, UA: 0.2
pH, UA: 5.5

## 2015-02-22 MED ORDER — CIPROFLOXACIN HCL 500 MG PO TABS: 500.0000 mg | ORAL_TABLET | Freq: Two times a day (BID) | ORAL | Status: DC

## 2015-02-22 NOTE — Progress Notes (Signed)
Urine checked in office.  Patient with UTI.  Urine sent for culture.  ABT started.

## 2015-02-23 ENCOUNTER — Ambulatory Visit (INDEPENDENT_AMBULATORY_CARE_PROVIDER_SITE_OTHER): Payer: PPO

## 2015-02-23 DIAGNOSIS — R0989 Other specified symptoms and signs involving the circulatory and respiratory systems: Secondary | ICD-10-CM

## 2015-02-25 ENCOUNTER — Telehealth: Payer: Self-pay | Admitting: *Deleted

## 2015-02-25 NOTE — Telephone Encounter (Signed)
-----   Message from Arnoldo Lenis, MD sent at 02/25/2015  3:39 PM EDT ----- Carotid US shows no significant blockages in the arteries of the neck  Zandra Abts MD

## 2015-02-25 NOTE — Telephone Encounter (Signed)
Pt made aware, routed to pcp

## 2015-02-26 LAB — URINE CULTURE: Colony Count: >=100000

## 2015-03-11 ENCOUNTER — Ambulatory Visit (INDEPENDENT_AMBULATORY_CARE_PROVIDER_SITE_OTHER): Payer: PPO | Admitting: Psychiatry

## 2015-03-11 DIAGNOSIS — F32A Depression, unspecified: Secondary | ICD-10-CM

## 2015-03-11 DIAGNOSIS — F419 Anxiety disorder, unspecified: Secondary | ICD-10-CM

## 2015-03-11 DIAGNOSIS — F329 Major depressive disorder, single episode, unspecified: Secondary | ICD-10-CM | POA: Diagnosis not present
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
## 2015-03-11 NOTE — Patient Instructions (Signed)
Discussed orally 

## 2015-03-14 ENCOUNTER — Ambulatory Visit (INDEPENDENT_AMBULATORY_CARE_PROVIDER_SITE_OTHER): Payer: PPO | Admitting: Family Medicine

## 2015-03-14 ENCOUNTER — Encounter: Payer: Self-pay | Admitting: Family Medicine

## 2015-03-14 VITALS — BP 120/68 | HR 100 | Resp 18 | Ht 67.0 in | Wt 289.1 lb

## 2015-03-14 DIAGNOSIS — R269 Unspecified abnormalities of gait and mobility: Secondary | ICD-10-CM

## 2015-03-14 DIAGNOSIS — M5430 Sciatica, unspecified side: Secondary | ICD-10-CM

## 2015-03-14 DIAGNOSIS — E785 Hyperlipidemia, unspecified: Secondary | ICD-10-CM

## 2015-03-14 DIAGNOSIS — M5440 Lumbago with sciatica, unspecified side: Secondary | ICD-10-CM

## 2015-03-14 DIAGNOSIS — IMO0001 Reserved for inherently not codable concepts without codable children: Secondary | ICD-10-CM

## 2015-03-14 DIAGNOSIS — M545 Low back pain: Secondary | ICD-10-CM

## 2015-03-14 DIAGNOSIS — K219 Gastro-esophageal reflux disease without esophagitis: Secondary | ICD-10-CM

## 2015-03-14 DIAGNOSIS — Z794 Long term (current) use of insulin: Secondary | ICD-10-CM

## 2015-03-14 DIAGNOSIS — E1065 Type 1 diabetes mellitus with hyperglycemia: Secondary | ICD-10-CM

## 2015-03-14 DIAGNOSIS — I10 Essential (primary) hypertension: Secondary | ICD-10-CM

## 2015-03-14 DIAGNOSIS — E1165 Type 2 diabetes mellitus with hyperglycemia: Secondary | ICD-10-CM

## 2015-03-14 MED ORDER — KETOROLAC TROMETHAMINE 60 MG/2ML IM SOLN
60.0000 mg | Freq: Once | INTRAMUSCULAR | Status: AC
  Administered 2015-03-14: 60 mg via INTRAMUSCULAR

## 2015-03-14 NOTE — Patient Instructions (Addendum)
Annual wellness in 4  Month, call if you need me before  Blood pressure is good  Blood  Sugar needs to improve, keep appt next week wih Dr Loanne Drilling please  Eat on a schedule   Labs today chem 7 and EGFR, and hBa1C  PLEASE make and keep appt for your colonoscopy this is PAST due  Please keep appt with nutritionist, you need this  Toradol injection in office today for back pain

## 2015-03-15 LAB — HEMOGLOBIN A1C
Hgb A1c MFr Bld: 9.1 % — ABNORMAL HIGH (ref <5.7)
Mean Plasma Glucose: 214 mg/dL — ABNORMAL HIGH (ref <117)

## 2015-03-15 LAB — COMPLETE METABOLIC PANEL WITH GFR
ALT: 25 U/L (ref 0–35)
AST: 25 U/L (ref 0–37)
Albumin: 4 g/dL (ref 3.5–5.2)
Alkaline Phosphatase: 128 U/L — ABNORMAL HIGH (ref 39–117)
BUN: 24 mg/dL — ABNORMAL HIGH (ref 6–23)
CO2: 32 mEq/L (ref 19–32)
Calcium: 9.4 mg/dL (ref 8.4–10.5)
Chloride: 101 mEq/L (ref 96–112)
Creat: 1.33 mg/dL — ABNORMAL HIGH (ref 0.50–1.10)
GFR, Est African American: 46 mL/min — ABNORMAL LOW
GFR, Est Non African American: 40 mL/min — ABNORMAL LOW
Glucose, Bld: 214 mg/dL — ABNORMAL HIGH (ref 70–99)
Potassium: 4.6 mEq/L (ref 3.5–5.3)
Sodium: 140 mEq/L (ref 135–145)
Total Bilirubin: 0.3 mg/dL (ref 0.2–1.2)
Total Protein: 7.4 g/dL (ref 6.0–8.3)

## 2015-03-15 LAB — MICROALBUMIN / CREATININE URINE RATIO
Creatinine, Urine: 180.9 mg/dL
Microalb Creat Ratio: 4.4 mg/g (ref 0.0–30.0)
Microalb, Ur: 0.8 mg/dL (ref <2.0)

## 2015-03-21 ENCOUNTER — Other Ambulatory Visit (HOSPITAL_COMMUNITY): Payer: Self-pay | Admitting: Psychiatry

## 2015-03-21 ENCOUNTER — Other Ambulatory Visit: Payer: Self-pay | Admitting: Family Medicine

## 2015-03-22 ENCOUNTER — Encounter: Payer: Self-pay | Admitting: Endocrinology

## 2015-03-22 ENCOUNTER — Ambulatory Visit (INDEPENDENT_AMBULATORY_CARE_PROVIDER_SITE_OTHER): Payer: PPO | Admitting: Endocrinology

## 2015-03-22 VITALS — BP 132/60 | HR 87 | Temp 98.0°F | Ht 67.0 in | Wt 289.0 lb

## 2015-03-22 DIAGNOSIS — E1165 Type 2 diabetes mellitus with hyperglycemia: Principal | ICD-10-CM

## 2015-03-22 DIAGNOSIS — E1065 Type 1 diabetes mellitus with hyperglycemia: Secondary | ICD-10-CM | POA: Diagnosis not present

## 2015-03-22 DIAGNOSIS — Z794 Long term (current) use of insulin: Principal | ICD-10-CM

## 2015-03-22 DIAGNOSIS — IMO0001 Reserved for inherently not codable concepts without codable children: Secondary | ICD-10-CM

## 2015-03-22 MED ORDER — INSULIN DETEMIR 100 UNIT/ML FLEXPEN: 60.0000 [IU] | PEN_INJECTOR | SUBCUTANEOUS | Status: DC

## 2015-03-22 NOTE — Patient Instructions (Addendum)
check your blood sugar 4 times a day: before the 3 meals, and at bedtime.  also check if you have symptoms of your blood sugar being too high or too low.  please keep a record of the readings and bring it to your next appointment here.  You can write it on any piece of paper.  please call us sooner if your blood sugar goes below 70, or if you have a lot of readings over 200.   Please change the insulin to to "levemir."  i have sent a prescription to your pharmacy.   Please call us next week, to tell us how the blood sugar is doing.   On this type of insulin schedule, you should eat meals on a regular schedule.  If a meal is missed or significantly delayed, your blood sugar could go low. Please come back for a follow-up appointment in 2 months.

## 2015-03-22 NOTE — Progress Notes (Signed)
Subjective:    Patient ID: Laura Atkins, female    DOB: [birth year], 74 y.o.   MRN: [id number]  HPI Pt returns for f/u of diabetes mellitus: DM type: insulin-requiring type 2.   Dx'ed: [id number] Complications: polyneuropathy, PAD, and foot ulcer.   Therapy: insulin since 2005.   GDM: never DKA: never Severe hypoglycemia: never Pancreatitis: never Other info: in early 2016, she changed to BID premixed insulin, due to poor results with multiple daily injections.   Interval history: Pt says she misses the insulin doses approx several times per week.  she brings a record of her cbg's which i have reviewed today.  It varies from 102-300.  It is highest at hs, and lowest in am.  Hx is from pt and dtr.   Past Medical History  Diagnosis Date  . Hypertension   . Diabetes mellitus   . Hyperlipidemia   . Obesity   . Anemia   . Anxiety and depression   . GERD (gastroesophageal reflux disease)   . Pruritus   . Osteoarthritis     Left knee; right shoulder; chronic neck and back pain  . Obstructive sleep apnea   . Urinary incontinence   . Tremor     This started months ago after her seizure progressing to very poor hand writing  . Seizures   . Depression   . Anxiety   . [id number])     Past Surgical History  Procedure Laterality Date  . Abdominal hysterectomy    . Breast excisional biopsy      Left; cyst  . Cholecystectomy    . Cataract extraction w/ intraocular lens implant Left 09/07/2013  . Eye surgery Left 09/07/2013    cataract    History   Social History  . Marital Status: Married    Spouse Name: saunders  . Number of Children: 5  . Years of Education: 12   Occupational History  . retired     retired   Social History Main Topics  . Smoking status: Never Smoker   . Smokeless tobacco: Never Used  . Alcohol Use: No  . Drug Use: No  . Sexual Activity: Yes    Birth Control/ Protection: Surgical   Other Topics Concern  . Not on file   Social History  Narrative   Patient lives at home with her husband [name].  Patient is retired.    Right handed.    Five Children.    Caffeine- 2 daily    Current Outpatient Prescriptions on File Prior to Visit  Medication Sig Dispense Refill  . acetaminophen (EXTRA STRENGTH PAIN RELIEF) 500 MG tablet Take 500 mg by mouth every 6 (six) hours as needed for pain.    Marland Kitchen amLODipine (NORVASC) 10 MG tablet TAKE 1 TABLET BY MOUTH EVERY DAY 90 tablet 0  . aspirin 81 MG tablet Take 81 mg by mouth daily.      Marland Kitchen atorvastatin (LIPITOR) 40 MG tablet TAKE 1 TABLET BY MOUTH EVERY DAY 90 tablet 0  . benazepril (LOTENSIN) 20 MG tablet TAKE 1 TABLET BY MOUTH EVERY DAILY 90 tablet 0  . clotrimazole (LOTRIMIN) 1 % cream Apply 1 application topically 2 (two) times daily. 30 g 0  . FLUoxetine (PROZAC) 20 MG capsule Take 1 capsule (20 mg total) by mouth 2 (two) times daily. 60 capsule 3  . gabapentin (NEURONTIN) 400 MG capsule TAKE 1 CAPSULE BY MOUTH THREE TIMES DAILY 90 capsule 3  . glucose blood (ONE TOUCH ULTRA TEST) test  strip Four times daily testing  DX E11.9 150 each 2  . hydrOXYzine (VISTARIL) 25 MG capsule TAKE 1 CAPSULE BY MOUTH AT BEDTIME FOR ITCHING -- DO NOT FILL 50MG  DOSE AS PER DR KEB 09-06-14 30 capsule 2  . Insulin Syringe-Needle U-100 (B-D INS SYR ULTRAFINE 1CC/30G) 30G X 1/2" 1 ML MISC USE AS DIRECTED 100 each 2  . Multiple Vitamins-Minerals (CENTRUM SILVER ULTRA WOMENS PO) Take 1 tablet by mouth daily.     . mupirocin cream (BACTROBAN) 2 % Apply 1 application topically 2 (two) times daily. Apply to affected naily twice daily for 1 week , then as needed 30 g 0  . omeprazole (PRILOSEC) 20 MG capsule TAKE 1 CAPSULE BY MOUTH DAILY. 90 capsule 0  . potassium chloride SA (K-DUR,KLOR-CON) 20 MEQ tablet Take 1 tablet (20 mEq total) by mouth 2 (two) times daily. 60 tablet 5  . torsemide (DEMADEX) 20 MG tablet Take 2 tablets (40 mg total) by mouth 2 (two) times daily. 120 tablet 6  . VOLTAREN 1 % GEL APPLY 4 GRAMS  TOPICALLY 4 TIMES DAILY. 100 g 0   No current facility-administered medications on file prior to visit.    Allergies  Allergen Reactions  . Penicillins Shortness Of Breath, Itching and Rash  . Prednisone Shortness Of Breath, Itching and Rash  . Propoxyphene N-Acetaminophen Itching and Nausea And Vomiting    Family History  Problem Relation Age of Onset  . ADD / ADHD Grandchild   . Bipolar disorder Grandchild   . Bipolar disorder Daughter   . Alcohol abuse Neg Hx   . Drug abuse Neg Hx   . Cancer Mother 30    lung   . Kidney disease Father   . Diabetes Sister     BP 132/60 mmHg  Pulse 87  Temp(Src) 98 F (36.7 C) (Oral)  Ht 5\' 7"  (1.702 m)  Wt 289 lb (131.09 kg)  BMI 45.25 kg/m2  SpO2 95%  Review of Systems She denies hypoglycemia    Objective:   Physical Exam VITAL SIGNS:  See vs page GENERAL: no distress.  Pulses: dorsalis pedis intact bilat.   MSK: no deformity of the feet.   CV: 1+ bilat leg edema.   Skin:  no ulcer on the feet.  normal color and temp on the feet. Neuro: sensation is intact to touch on the feet, but decreased from normal.   Ext: There is bilateral onychomycosis of the toenails  Lab Results  Component Value Date   HGBA1C 9.1* 03/14/2015      Assessment & Plan:  DM: glycemic control is worse.  Patient is advised the following: Patient Instructions  check your blood sugar 4 times a day: before the 3 meals, and at bedtime.  also check if you have symptoms of your blood sugar being too high or too low.  please keep a record of the readings and bring it to your next appointment here.  You can write it on any piece of paper.  please call us sooner if your blood sugar goes below 70, or if you have a lot of readings over 200.   Please change the insulin to to "levemir."  i have sent a prescription to your pharmacy.   Please call us next week, to tell us how the blood sugar is doing.   On this type of insulin schedule, you should eat meals on a  regular schedule.  If a meal is missed or significantly delayed, your blood sugar could  go low. Please come back for a follow-up appointment in 2 months.

## 2015-03-25 ENCOUNTER — Telehealth: Payer: Self-pay

## 2015-03-25 ENCOUNTER — Telehealth: Payer: Self-pay | Admitting: Endocrinology

## 2015-03-25 NOTE — Telephone Encounter (Signed)
See below

## 2015-03-25 NOTE — Telephone Encounter (Signed)
I have called this pt 4 times today and twice with this issue.She did not answer and her VM is not set up

## 2015-03-25 NOTE — Telephone Encounter (Signed)
Pt stated that she gave you all the referral information on this call.

## 2015-03-25 NOTE — Telephone Encounter (Signed)
I tried to contact the pt. Will try again at a later time.

## 2015-03-25 NOTE — Telephone Encounter (Signed)
Pt needs to speak with CMA regarding med change Dr. Dwyane Dee requested her to call back.

## 2015-03-25 NOTE — Telephone Encounter (Signed)
Pt is having blood sugars in the 200's since she started on the Levemir what do you advise she do?

## 2015-03-25 NOTE — Telephone Encounter (Signed)
That is not accurate.

## 2015-03-25 NOTE — Telephone Encounter (Signed)
Pt calling about referral Would not give further info

## 2015-03-25 NOTE — Telephone Encounter (Signed)
She needs to reduce her Levemir to 40 units instead of 60 and start back on her previous premixed insulin 20 units before breakfast and supper

## 2015-03-29 ENCOUNTER — Telehealth: Payer: Self-pay | Admitting: Endocrinology

## 2015-03-29 ENCOUNTER — Telehealth: Payer: Self-pay | Admitting: *Deleted

## 2015-03-29 NOTE — Telephone Encounter (Signed)
Pt called to report blood sugar readings 7/3: Fasting: 206 Before Supper: 243 Before Bed: 331  7/4: Fasting: 178  Before Lunch: 263 Before Supper: 294  Before Bed: 224   7/5: Fasting: 268   Pt confirmed she is taking 60 units in the morning. Pt stated she felt like the [employer] brand insulin worked better for her. Please advise. Thanks!

## 2015-03-29 NOTE — Telephone Encounter (Signed)
Pt called and is requesting nurse call her back her blood sugars are  high

## 2015-03-29 NOTE — Telephone Encounter (Signed)
Pt advised of note below and voiced understanding. Pt is to call our office on Monday to report blood sugar readings.

## 2015-03-29 NOTE — Telephone Encounter (Signed)
Please increase levemir to 80 units qam. The levemir will work well.  It is just a matter of getting the right amount.

## 2015-03-29 NOTE — Telephone Encounter (Signed)
Pt called requesting a nurse to call her back, pt stated Dr. Loanne Drilling has put her on levemir and her sugars has been running high pt said her sugar this morning was 268. Pt said she can't get Dr. Cordelia Pen office to call her back. Please advise

## 2015-03-30 NOTE — Telephone Encounter (Signed)
pls explain to pt that she needs to contact Dr Loanne Drilling o, call his office since he is directing her diabetes management , I am sure there is a Provider in office who will be able to respond

## 2015-04-08 ENCOUNTER — Ambulatory Visit (INDEPENDENT_AMBULATORY_CARE_PROVIDER_SITE_OTHER): Payer: PPO | Admitting: Psychiatry

## 2015-04-08 DIAGNOSIS — F329 Major depressive disorder, single episode, unspecified: Secondary | ICD-10-CM | POA: Diagnosis not present

## 2015-04-08 DIAGNOSIS — F32A Depression, unspecified: Secondary | ICD-10-CM

## 2015-04-08 DIAGNOSIS — F419 Anxiety disorder, unspecified: Secondary | ICD-10-CM | POA: Diagnosis not present

## 2015-04-08 NOTE — Progress Notes (Signed)
      THERAPIST PROGRESS NOTE  Session Time: Friday 04/08/2015 11:10 AM- 12:00 PM  Participation Level: Active  Behavioral Response: CasualAlert/Anxious  Type of Therapy: Individual Therapy  Treatment Goals addressed: Improve coping skills to alleviate symptoms of depression (isolative behaviors, depressed mood, ruminating thoughts, and negative thinking) per self report for 3 consecutive months  Interventions: Supportive,   Summary: Laura Atkins is a 74 y.o. female who presents with a long-standing history of depression. She was referred to this practice for continuity of care as she was seen at [facility] but was having difficulty scheduling a time to see a therapist. She continues to experience depressed mood and excessive worry   Patient reports she continues to experience stress and anxiety but says it is now mainly related to concerns about her health. She recently changed medication for diabetes but her glucose levels are higher on the new medication.  She also reports the new medication is a lot more expensive. She has called doctor who advise her to continue taking the medication. She plans to talk with him during her next visit. She also is experiencing back pain today and worries about cause. She also expresses sadness and anxiety about her granddaughter who usually stays with her, being away in [place]. Patient reports she and her husband have been attending Bible Study at another church and have met new people. They really like their group and feel accepted. She states this has been helping her some.    Suicidal/Homicidal: No  Therapist Response: Therapist works with patient to process feelings, discuss the physical effects of stress and anxiety, discuss rationale for using relaxation techniques, identify relaxation techniques, identify coping statements   Plan: Return again in 4 weeks.  Patient agrees to use relaxation technique daily ( diaphragmatic breathing, listening to  relaxation CD  Diagnosis: Axis I: Depressive Disorder NOS    Axis II: No diagnosis   Dewarren Ledbetter, LCSW 04/08/2015

## 2015-04-08 NOTE — Patient Instructions (Signed)
Discussed orally 

## 2015-04-11 ENCOUNTER — Encounter: Payer: PPO | Attending: Family Medicine | Admitting: Nutrition

## 2015-04-11 ENCOUNTER — Telehealth: Payer: Self-pay | Admitting: Cardiology

## 2015-04-11 VITALS — Ht 68.0 in | Wt 289.0 lb

## 2015-04-11 DIAGNOSIS — E118 Type 2 diabetes mellitus with unspecified complications: Secondary | ICD-10-CM | POA: Insufficient documentation

## 2015-04-11 DIAGNOSIS — Z713 Dietary counseling and surveillance: Secondary | ICD-10-CM | POA: Diagnosis not present

## 2015-04-11 DIAGNOSIS — IMO0002 Reserved for concepts with insufficient information to code with codable children: Secondary | ICD-10-CM

## 2015-04-11 DIAGNOSIS — Z6841 Body Mass Index (BMI) 40.0 and over, adult: Secondary | ICD-10-CM | POA: Insufficient documentation

## 2015-04-11 DIAGNOSIS — E1165 Type 2 diabetes mellitus with hyperglycemia: Secondary | ICD-10-CM

## 2015-04-11 NOTE — Telephone Encounter (Signed)
Patient is still experiencing fluid around her ankles.  Stated that she has been sick last couple of days. At night her right upper arm wakes her up from the pain. Also, her husband told her while she is sleeping she is making a gurgling noise.

## 2015-04-11 NOTE — Telephone Encounter (Signed)
No VM on phone-cc

## 2015-04-11 NOTE — Progress Notes (Signed)
  Medical Nutrition Therapy:  Appt start time: 0930 end time:  1000.  Assessment:  Primary concerns today: Diabetess. He husband is with her today. Has fluid around her ankles and has problems with more shortness of breath over the weekend. She notes she husband says she 'rattles' some at night when breathing. A1C 9.1%, up from 8.9%. FBS:200's  Didn't bring meter or log with her. Doesn't feel good.Feels cold. Has been cutting back on sodas but hasn't cut them out totally. Not exercising. She is currently taking 40 units of Levermir twice a day. Diet remains inconsistent. Non compliant with diet regimen. Encouraged her to contact her cardiologist to discuss her symptoms to make sure she wasn't going into CHF with her heart history.   Preferred Learning Style:   Auditory  Visual  Hands on  No preference indicated   Learning Readiness:   Says she is ready.  Contemplating  MEDICATIONS: See list   DIETARY INTAKE:  24-hr recall:  B ( AM): Biscuit. Snk ( AM): none L ( PM): Toss salad with [employer] dressing and baked salmon, Coke Snk ( PM): none, water D ( PM): Gingerbread, 2 slices,  Snk ( PM): gram crackers, or fruit cups, OR nabs Beverages: water, juice, soda,   Usual physical activity:   Estimated energy needs: 1500 calories 170 g carbohydrates 112 g protein 42 g fat  Progress Towards Goal(s):  In progress.   Nutritional Diagnosis:  NB-1.2 Harmful beliefs/attitudes about food or nutrition-related topics (use with caution) As related to Diabetes.  As evidenced by A1C 8.9% .    Intervention:  Nutrition counseling and diabetes education provided on low fat high fiber low sodium diet, CHO Counting, Meal planning, portion sizes, My Plate, target ranges for blood sugars,  Exercise and benefits of weight loss and complications related to uncontrolled DM. Stressed need for diet compliance and recording blood sugars and bring meter with her at all visits. Reviewed low sodium  diet and symptoms of fluid overload and when to contact PCP or cardiologist.  Goals: Contact Cardiologist to discuss symptoms. 1. Eat thee balanced meals based on Plate Method. Avoid foods that are processed and high in sodium-bologna, sausage, biscuits, bacon and fast foods. 2. Increase fresh fruits and vegetables. 3. Cut out sweets, cakes, cookies, desserts and juices and SODA!! 4. Drink only water. 5. Avoid snacks between meals- Avoid foods high in sodium; biscuits, sausage, bacon, bologna and processed meats and fast foods.  Need to work on following diet to improve blood sugars. 6. Eat on schedule as discussed. 7. Get A1C down to 7.5% in three months. 8. Lose 1 lb per week. 9. Try water aerobics or exercise at the [facility] three times per week for 30 minutes each.   Teaching Method Utilized:  Visual Auditory Hands on  Handouts given during visit include:  The Plate Method  The Meal Plan Card  Diabetes Instructions  Barriers to learning/adherence to lifestyle change: None  Demonstrated degree of understanding via:  Teach Back   Monitoring/Evaluation:  Dietary intake, exercise, meal planning, SBG, and body weight in 1 month.

## 2015-04-11 NOTE — Telephone Encounter (Signed)
She should come in next 2-3weeks to have her swelling reevaluted.

## 2015-04-11 NOTE — Telephone Encounter (Signed)
States she has felt down since granddaughter went to [place] to visit.has ben eating ludens cough drops every morning,did not mention her arm hurting. Pt does not watch salt,eats "a little" now and then.Does not have scales and has not bought compression stockings yet. Encouraged to purchase scale and weigh daily and fill rx for compression stockings.She does rave about the effectiveness of torsemide

## 2015-04-12 NOTE — Telephone Encounter (Signed)
Spoke with pt this am,wil call Laura Atkins and make fu apt

## 2015-04-13 NOTE — Patient Instructions (Signed)
Goals: Contact Cardiologist to discuss symptoms. 1. Eat thee balanced meals based on Plate Method. Avoid foods that are processed and high in sodium-bologna, sausage, biscuits, bacon and fast foods. 2. Increase fresh fruits and vegetables. 3. Cut out sweets, cakes, cookies, desserts and juices and SODA!! 4. Drink only water. 5. Avoid snacks between meals- Avoid foods high in sodium; biscuits, sausage, bacon, bologna and processed meats and fast foods.  Need to work on following diet to improve blood sugars. 6. Eat on schedule as discussed. 7. Get A1C down to 7.5% in three months. 8. Lose 1 lb per week. 9. Try water aerobics or exercise at the [facility] three times per week for 30 minutes each.

## 2015-04-14 ENCOUNTER — Ambulatory Visit (INDEPENDENT_AMBULATORY_CARE_PROVIDER_SITE_OTHER): Payer: PPO | Admitting: Family Medicine

## 2015-04-14 ENCOUNTER — Encounter: Payer: Self-pay | Admitting: Family Medicine

## 2015-04-14 VITALS — BP 128/70 | HR 84 | Resp 16 | Ht 67.0 in | Wt 284.0 lb

## 2015-04-14 DIAGNOSIS — M25511 Pain in right shoulder: Secondary | ICD-10-CM | POA: Diagnosis not present

## 2015-04-14 DIAGNOSIS — E1065 Type 1 diabetes mellitus with hyperglycemia: Secondary | ICD-10-CM

## 2015-04-14 DIAGNOSIS — R269 Unspecified abnormalities of gait and mobility: Secondary | ICD-10-CM

## 2015-04-14 DIAGNOSIS — E1165 Type 2 diabetes mellitus with hyperglycemia: Secondary | ICD-10-CM

## 2015-04-14 DIAGNOSIS — M4712 Other spondylosis with myelopathy, cervical region: Secondary | ICD-10-CM | POA: Insufficient documentation

## 2015-04-14 DIAGNOSIS — I10 Essential (primary) hypertension: Secondary | ICD-10-CM

## 2015-04-14 DIAGNOSIS — R600 Localized edema: Secondary | ICD-10-CM

## 2015-04-14 DIAGNOSIS — IMO0001 Reserved for inherently not codable concepts without codable children: Secondary | ICD-10-CM

## 2015-04-14 DIAGNOSIS — Z794 Long term (current) use of insulin: Secondary | ICD-10-CM

## 2015-04-14 HISTORY — DX: Pain in right shoulder: M25.511

## 2015-04-14 LAB — COMPLETE METABOLIC PANEL WITH GFR
ALT: 22 U/L (ref 0–35)
AST: 17 U/L (ref 0–37)
Albumin: 3.8 g/dL (ref 3.5–5.2)
Alkaline Phosphatase: 128 U/L — ABNORMAL HIGH (ref 39–117)
BUN: 31 mg/dL — ABNORMAL HIGH (ref 6–23)
CO2: 30 mEq/L (ref 19–32)
Calcium: 9.7 mg/dL (ref 8.4–10.5)
Chloride: 96 mEq/L (ref 96–112)
Creat: 1.07 mg/dL (ref 0.50–1.10)
GFR, Est African American: 60 mL/min
GFR, Est Non African American: 52 mL/min — ABNORMAL LOW
Glucose, Bld: 262 mg/dL — ABNORMAL HIGH (ref 70–99)
Potassium: 4.7 mEq/L (ref 3.5–5.3)
Sodium: 139 mEq/L (ref 135–145)
Total Bilirubin: 0.4 mg/dL (ref 0.2–1.2)
Total Protein: 7.4 g/dL (ref 6.0–8.3)

## 2015-04-14 MED ORDER — GABAPENTIN 400 MG PO CAPS: ORAL_CAPSULE | ORAL | Status: DC

## 2015-04-14 NOTE — Patient Instructions (Signed)
Annual wellness as before, call if you need me sooner  Please get lab today and xray of shoulder at [facility]  Increase gabapentin one twice daily and two at bedtime   Pain in upper arm is from arthritis in neck, You are referred to PT twice weekly in [place] for 6 weeks  You are referred to Dr Dorris Fetch, continue current endo until you get appt with him please

## 2015-04-15 ENCOUNTER — Ambulatory Visit (HOSPITAL_COMMUNITY)
Admission: RE | Admit: 2015-04-15 | Discharge: 2015-04-15 | Disposition: A | Discharge status: Home / Self Care | Payer: PPO | Source: Ambulatory Visit | Attending: Family Medicine | Admitting: Family Medicine

## 2015-04-15 DIAGNOSIS — M25511 Pain in right shoulder: Secondary | ICD-10-CM | POA: Diagnosis not present

## 2015-04-15 DIAGNOSIS — M5440 Lumbago with sciatica, unspecified side: Secondary | ICD-10-CM | POA: Insufficient documentation

## 2015-04-15 NOTE — Assessment & Plan Note (Signed)
Hyperlipidemia:Low fat diet discussed and encouraged.   Lipid Panel  Lab Results  Component Value Date   CHOL 154 12/13/2014   HDL 67 12/13/2014   LDLCALC 65 12/13/2014   TRIG 108 12/13/2014   CHOLHDL 2.3 12/13/2014   Controlled, no change in medication

## 2015-04-15 NOTE — Progress Notes (Signed)
Laura Atkins     MRN: [id number]      DOB: 08-Sep-1941   HPI Laura Atkins is here for follow up and re-evaluation of chronic medical conditions, medication management and review of any available recent lab and radiology data.  Preventive health is updated, specifically  Cancer screening and Immunization.  Still needs eye exam and colonoscopy, states she will schedule Questions or concerns regarding consultations or procedures which the PT has had in the interim are  Addressed. Reports blood sugars are still fluctuating but sheis working with her endo The PT denies any adverse reactions to current medications since the last visit.  C/o increased and uncontrolled back pain radiiting to both legs in the past week, no specific aggravating factor   ROS Denies recent fever or chills. Denies sinus pressure, nasal congestion, ear pain or sore throat. Denies chest congestion, productive cough or wheezing. Denies chest pains, palpitations and leg swelling Denies abdominal pain, nausea, vomiting,diarrhea or constipation.   Denies dysuria, frequency, hesitancy or incontinence.  Denies headaches, seizures, numbness, or tingling. Denies uncontrolled depression, anxiety or insomnia. Denies skin break down or rash.   PE  BP 120/68 mmHg  Pulse 100  Resp 18  Ht 5\' 7"  (1.702 m)  Wt 289 lb 1.9 oz (131.144 kg)  BMI 45.27 kg/m2  SpO2 96%  Patient alert and oriented and in no cardiopulmonary distress.  HEENT: No facial asymmetry, EOMI,   oropharynx pink and moist.  Neck decreased ROM, no JVD, no mass.  Chest: Clear to auscultation bilaterally.  CVS: S1, S2 no murmurs, no S3.Regular rate.  ABD: Soft non tender.   Ext: No edema  MS: decreased  ROM spine, shoulders, hips and knees.  Skin: Intact, no ulcerations or rash noted.  Psych: Good eye contact, normal affect. Memory intact not anxious or depressed appearing.  CNS: CN 2-12 intact, power,  normal throughout.no focal deficits  noted.   Assessment & Plan   Back pain of lumbar region with sciatica Toradol  administered IM in the office   UNSTEADY GAIT Safety precautions and need for constant use of asistive device is discussed  Morbid obesity Unchanged. Patient re-educated about  the importance of commitment to a  minimum of 150 minutes of exercise per week.  The importance of healthy food choices with portion control discussed. Encouraged to start a food diary, count calories and to consider  joining a support group. Sample diet sheets offered. Goals set by the patient for the next several months.   Weight /BMI 04/14/2015 04/11/2015 03/22/2015  WEIGHT 284 lb 289 lb 289 lb  HEIGHT 5\' 7"  5\' 8"  5\' 7"   BMI 44.47 kg/m2 43.95 kg/m2 45.25 kg/m2  Some encounter information is confidential and restricted. Go to Review Flowsheets activity to see all data.    Current exercise per week 60 minutes.marked limitation in safe mobility   Hyperlipemia Hyperlipidemia:Low fat diet discussed and encouraged.   Lipid Panel  Lab Results  Component Value Date   CHOL 154 12/13/2014   HDL 67 12/13/2014   LDLCALC 65 12/13/2014   TRIG 108 12/13/2014   CHOLHDL 2.3 12/13/2014   Controlled, no change in medication      Essential hypertension Controlled, no change in medication DASH diet and commitment to daily physical activity for a minimum of 30 minutes discussed and encouraged, as a part of hypertension management. The importance of attaining a healthy weight is also discussed.  BP/Weight 04/14/2015 04/11/2015 03/22/2015 03/14/2015 02/15/2015 02/07/2015 0000000  Systolic BP  128 - 132 120 [id number] - [id number]  Diastolic BP 70 - 60 68 69 - 60  Wt. (Lbs) 284 289 289 289.12 285.8 289 286  BMI 44.47 43.95 45.25 45.27 44.75 45.25 44.11  Some encounter information is confidential and restricted. Go to Review Flowsheets activity to see all data.        GERD Controlled, no change in medication   Diabetes mellitus, insulin  dependent (IDDM), uncontrolled Uncontrolled, management per endo Medication and dietary compliance discussed and stressed. Pt also encouraged to commit to daily physical exercise as able, chair exercise discussed

## 2015-04-15 NOTE — Assessment & Plan Note (Signed)
Uncontrolled, management per endo Medication and dietary compliance discussed and stressed. Pt also encouraged to commit to daily physical exercise as able, chair exercise discussed

## 2015-04-15 NOTE — Assessment & Plan Note (Signed)
Controlled, no change in medication  

## 2015-04-15 NOTE — Assessment & Plan Note (Signed)
Improved. Patient re-educated about  the importance of commitment to a  minimum of 150 minutes of exercise per week.  The importance of healthy food choices with portion control discussed. Encouraged to start a food diary, count calories and to consider  joining a support group. Sample diet sheets offered. Goals set by the patient for the next several months.   Weight /BMI 04/14/2015 04/11/2015 03/22/2015  WEIGHT 284 lb 289 lb 289 lb  HEIGHT 5\' 7"  5\' 8"  5\' 7"   BMI 44.47 kg/m2 43.95 kg/m2 45.25 kg/m2  Some encounter information is confidential and restricted. Go to Review Flowsheets activity to see all data.    Current exercise per week 90 minutes.

## 2015-04-15 NOTE — Assessment & Plan Note (Addendum)
Unchanged. Patient re-educated about  the importance of commitment to a  minimum of 150 minutes of exercise per week.  The importance of healthy food choices with portion control discussed. Encouraged to start a food diary, count calories and to consider  joining a support group. Sample diet sheets offered. Goals set by the patient for the next several months.   Weight /BMI 04/14/2015 04/11/2015 03/22/2015  WEIGHT 284 lb 289 lb 289 lb  HEIGHT 5\' 7"  5\' 8"  5\' 7"   BMI 44.47 kg/m2 43.95 kg/m2 45.25 kg/m2  Some encounter information is confidential and restricted. Go to Review Flowsheets activity to see all data.    Current exercise per week 60 minutes.marked limitation in safe mobility

## 2015-04-15 NOTE — Assessment & Plan Note (Signed)
Controlled, no change in medication DASH diet and commitment to daily physical activity for a minimum of 30 minutes discussed and encouraged, as a part of hypertension management. The importance of attaining a healthy weight is also discussed.  BP/Weight 04/14/2015 04/11/2015 03/22/2015 03/14/2015 02/15/2015 02/07/2015 0000000  Systolic BP 0000000 - Q000111Q 123456 [id number] - [id number]  Diastolic BP 70 - 60 68 69 - 60  Wt. (Lbs) 284 289 289 289.12 285.8 289 286  BMI 44.47 43.95 45.25 45.27 44.75 45.25 44.11  Some encounter information is confidential and restricted. Go to Review Flowsheets activity to see all data.

## 2015-04-15 NOTE — Assessment & Plan Note (Addendum)
Toradol  administered IM in the office

## 2015-04-15 NOTE — Progress Notes (Signed)
CASARA CHURCHILL     MRN: [id number]      DOB: [birth year]   HPI Ms. Peron is here with main c/o intermittent episodes of feeling weak and tired , just not like herself since the past 5 days. States unable to explain anymore just how she feels. Denies fever chills or any symptoms of infection. C/o upper arm pain on right which awakens her at night rated at a 10, worse in past approx 2 weeks, no know injury to shoulder or arm, does have severe arthritis in her neck. States that her appt with diabetic educator was excellent, and that she wishes to return to Dr Dorris Fetch for her diabetes management , as closer to home. States her main problem was with the office staff, and that she liked Dr Dorris Fetch Understands the need to stay with Dr Loanne Drilling until she gets an appt locally.  see how  for follow up and re-evaluation of chronic medical conditions, medication management and review of any available recent lab and radiology data.  Preventive health is updated, specifically  Cancer screening and Immunization.   Questions or concerns regarding consultations or procedures which the PT has had in the interim are  addressed. The PT denies any adverse reactions to current medications since the last visit.  Reports fluctuating blood sugars, and thinks that her previous   ROS Denies recent fever or chills.c/o fatigue and "not feeling right " up to yesterday, feels goodd today Denies sinus pressure, nasal congestion, ear pain or sore throat. Denies chest congestion, productive cough or wheezing. Denies chest pains, palpitations and leg swelling Denies abdominal pain, nausea, vomiting,diarrhea or constipation.   Denies dysuria, frequency, hesitancy or incontinence.  Denies headaches, seizures, numbness, or tingling. Denies uncontrolled  depression, anxiety or insomnia. Denies skin break down or rash.   PE  BP 128/70 mmHg  Pulse 84  Resp 16  Ht 5\' 7"  (1.702 m)  Wt 284 lb (128.822 kg)  BMI 44.47 kg/m2  SpO2  97%  Patient alert and oriented and in no cardiopulmonary distress.  HEENT: No facial asymmetry, EOMI,   oropharynx pink and moist.  Neck decreased ROM no JVD, no mass.  Chest: Clear to auscultation bilaterally.  CVS: S1, S2 no murmurs, no S3.Regular rate.  ABD: Soft non tender.   Ext: No edema  MS: Decreased  ROM spine, shoulders, hips and knees.  Skin: Intact, no ulcerations or rash noted.  Psych: Good eye contact, normal affect. Memory intact not anxious or depressed appearing.  CNS: CN 2-12 intact, power,  normal throughout.no focal deficits noted.   Assessment & Plan   Shoulder pain, right Increased and uncontrolled right shoulder and upper arm pain awakening pt, I believe neck pathology is the culprit but will obtain x ray of shoulder to ensure no significant bony pathology  Spondylosis, cervical, with myelopathy Increased and uncontrolled and disabling RUE o pain awakening pt, who has established cervical spine disease. Refer for PT, twice weekly for 6 weeks,  and increase gabapentin dose  Diabetes mellitus, insulin dependent (IDDM), uncontrolled Ms. Margheim is reminded of the importance of commitment to daily physical activity for 30 minutes or more, as able and the need to limit carbohydrate intake to 30 to 60 grams per meal to help with blood sugar control.   The need to take medication as prescribed, test blood sugar as directed, and to call between visits if there is a concern that blood sugar is uncontrolled is also discussed.   Ms. Virani  is reminded of the importance of daily foot exam, annual eye examination, and good blood sugar, blood pressure and cholesterol control.  Diabetic Labs Latest Ref Rng 04/14/2015 03/14/2015 12/28/2014 12/13/2014 08/18/2014  HbA1c <5.7 % - 9.1(H) - 8.9(H) 8.0(H)  Microalbumin <2.0 mg/dL - 0.8 - - -  Micro/Creat Ratio 0.0 - 30.0 mg/g - 4.4 - - -  Chol 0 - 200 mg/dL - - - 154 -  HDL >=46 mg/dL - - - 67 -  Calc LDL 0 - 99 mg/dL - - - 65  -  Triglycerides <150 mg/dL - - - 108 -  Creatinine 0.50 - 1.10 mg/dL 1.07 1.33(H) 1.00 0.91 0.87   BP/Weight 04/14/2015 04/11/2015 03/22/2015 03/14/2015 02/15/2015 02/07/2015 0000000  Systolic BP 0000000 - Q000111Q 123456 A999333 - [id number]  Diastolic BP 70 - 60 68 69 - 60  Wt. (Lbs) 284 289 289 289.12 285.8 289 286  BMI 44.47 43.95 45.25 45.27 44.75 45.25 44.11  Some encounter information is confidential and restricted. Go to Review Flowsheets activity to see all data.   Foot/eye exam completion dates Latest Ref Rng 03/22/2015 10/14/2014  Eye Exam No Retinopathy - -  Foot Form Completion - Done Done    Remains uncontrolled and wishes to return to local Provider for geographic reasons, referral entered , pt to wait on appt with Dr Dorris Fetch before terminating current relationship, she understands this clearly     Essential hypertension Controlled, no change in medication DASH diet and commitment to daily physical activity for a minimum of 30 minutes discussed and encouraged, as a part of hypertension management. The importance of attaining a healthy weight is also discussed.  BP/Weight 04/14/2015 04/11/2015 03/22/2015 03/14/2015 02/15/2015 02/07/2015 0000000  Systolic BP 0000000 - Q000111Q 123456 [id number] - [id number]  Diastolic BP 70 - 60 68 69 - 60  Wt. (Lbs) 284 289 289 289.12 285.8 289 286  BMI 44.47 43.95 45.25 45.27 44.75 45.25 44.11  Some encounter information is confidential and restricted. Go to Review Flowsheets activity to see all data.        Morbid obesity Improved. Patient re-educated about  the importance of commitment to a  minimum of 150 minutes of exercise per week.  The importance of healthy food choices with portion control discussed. Encouraged to start a food diary, count calories and to consider  joining a support group. Sample diet sheets offered. Goals set by the patient for the next several months.   Weight /BMI 04/14/2015 04/11/2015 03/22/2015  WEIGHT 284 lb 289 lb 289 lb  HEIGHT 5\' 7"  5\' 8"  5\' 7"   BMI  44.47 kg/m2 43.95 kg/m2 45.25 kg/m2  Some encounter information is confidential and restricted. Go to Review Flowsheets activity to see all data.    Current exercise per week 90 minutes.   Bilateral leg edema Improved, with minimal edema  UNSTEADY GAIT Home safety discussed briefly and need to continually use assiitive device

## 2015-04-15 NOTE — Assessment & Plan Note (Signed)
Increased and uncontrolled right shoulder and upper arm pain awakening pt, I believe neck pathology is the culprit but will obtain x ray of shoulder to ensure no significant bony pathology

## 2015-04-15 NOTE — Assessment & Plan Note (Signed)
Improved, with minimal edema

## 2015-04-15 NOTE — Assessment & Plan Note (Addendum)
Increased and uncontrolled and disabling RUE o pain awakening pt, who has established cervical spine disease. Refer for PT, twice weekly for 6 weeks,  and increase gabapentin dose

## 2015-04-15 NOTE — Assessment & Plan Note (Signed)
Home safety discussed briefly and need to continually use assiitive device

## 2015-04-15 NOTE — Assessment & Plan Note (Signed)
Safety precautions and need for constant use of asistive device is discussed

## 2015-04-15 NOTE — Assessment & Plan Note (Signed)
Laura Atkins is reminded of the importance of commitment to daily physical activity for 30 minutes or more, as able and the need to limit carbohydrate intake to 30 to 60 grams per meal to help with blood sugar control.   The need to take medication as prescribed, test blood sugar as directed, and to call between visits if there is a concern that blood sugar is uncontrolled is also discussed.   Laura Atkins is reminded of the importance of daily foot exam, annual eye examination, and good blood sugar, blood pressure and cholesterol control.  Diabetic Labs Latest Ref Rng 04/14/2015 03/14/2015 12/28/2014 12/13/2014 08/18/2014  HbA1c <5.7 % - 9.1(H) - 8.9(H) 8.0(H)  Microalbumin <2.0 mg/dL - 0.8 - - -  Micro/Creat Ratio 0.0 - 30.0 mg/g - 4.4 - - -  Chol 0 - 200 mg/dL - - - 154 -  HDL >=46 mg/dL - - - 67 -  Calc LDL 0 - 99 mg/dL - - - 65 -  Triglycerides <150 mg/dL - - - 108 -  Creatinine 0.50 - 1.10 mg/dL 1.07 1.33(H) 1.00 0.91 0.87   BP/Weight 04/14/2015 04/11/2015 03/22/2015 03/14/2015 02/15/2015 02/07/2015 0000000  Systolic BP 0000000 - Q000111Q 123456 [id number] - [id number]  Diastolic BP 70 - 60 68 69 - 60  Wt. (Lbs) 284 289 289 289.12 285.8 289 286  BMI 44.47 43.95 45.25 45.27 44.75 45.25 44.11  Some encounter information is confidential and restricted. Go to Review Flowsheets activity to see all data.   Foot/eye exam completion dates Latest Ref Rng 03/22/2015 10/14/2014  Eye Exam No Retinopathy - -  Foot Form Completion - Done Done    Remains uncontrolled and wishes to return to local Provider for geographic reasons, referral entered , pt to wait on appt with Dr Dorris Fetch before terminating current relationship, she understands this clearly

## 2015-04-18 ENCOUNTER — Telehealth: Payer: Self-pay | Admitting: *Deleted

## 2015-04-18 NOTE — Telephone Encounter (Signed)
Pt is aware that she needs to stay with Dr. Loanne Drilling and that she cannot switch to Dr. Dorris Fetch pt stated okay.

## 2015-04-19 ENCOUNTER — Other Ambulatory Visit (HOSPITAL_COMMUNITY): Payer: Self-pay | Admitting: Psychiatry

## 2015-04-25 ENCOUNTER — Telehealth: Payer: Self-pay | Admitting: *Deleted

## 2015-04-25 ENCOUNTER — Telehealth (HOSPITAL_COMMUNITY): Payer: Self-pay | Admitting: *Deleted

## 2015-04-25 ENCOUNTER — Other Ambulatory Visit: Payer: Self-pay

## 2015-04-25 ENCOUNTER — Telehealth: Payer: Self-pay | Admitting: Endocrinology

## 2015-04-25 MED ORDER — GLUCOSE BLOOD VI STRP: ORAL_STRIP | Status: DC

## 2015-04-25 NOTE — Telephone Encounter (Signed)
Patient need prescription for test strips for her one touch Viero.

## 2015-04-25 NOTE — Telephone Encounter (Signed)
Pt stated she has tried calling Dr. Loanne Drilling office and she cannot get in touch with them, pt is requesting the nurse to call and see if she can get a refill on her test strips to [facility], pt uses [facility]

## 2015-04-25 NOTE — Telephone Encounter (Signed)
patient called regarding Flouxetine 20 mg.   patient states pharmacy called her and said her prescription has run out.   Need a phone call for refill.

## 2015-04-26 ENCOUNTER — Telehealth (HOSPITAL_COMMUNITY): Payer: Self-pay | Admitting: *Deleted

## 2015-04-26 NOTE — Telephone Encounter (Signed)
Pt pharmacy request refills for pt Fluoxetine 200 mg BID. Called pt and informed her that she should run out of her medication by 05-14-15 and her appt is scheduled got 05-11-15. Informed pt that at the time of office visit is when Dr. Harrington Challenger will fill her medication. Pt agreed and showed understnading.

## 2015-04-26 NOTE — Telephone Encounter (Signed)
Called pt and informed her that she should have tablets left until her appt that is scheduled for 05-11-15. Pt agreed and she stated that she will come to appt

## 2015-04-29 ENCOUNTER — Telehealth: Payer: Self-pay | Admitting: Endocrinology

## 2015-04-29 NOTE — Telephone Encounter (Signed)
Spoke with patient and she is aware that she needs to call back to Dr. Cordelia Pen office and leave a message if she is unable to speak with someone at that time.

## 2015-04-29 NOTE — Telephone Encounter (Signed)
please call patient: We can change insulins.  However, why don't we just increase the current insulin.  Please verify current levemir is 60 units qam.  Then increase to 90 units qam.  i'll see you next week.

## 2015-04-29 NOTE — Telephone Encounter (Signed)
See note below to be advised, Thanks!

## 2015-04-29 NOTE — Telephone Encounter (Signed)
I tried to contact the pt. I will try again at a later time.

## 2015-04-29 NOTE — Telephone Encounter (Signed)
Patient called stating that she is not happy with her current insulin levemir Laura Atkins complains of not keeping her sugars down  She would like to go back to her previous insulin Relion   I advised her to make a follow up with Dr. Loanne Drilling to discuss this  Her appointment is 8.9.16  Thank you

## 2015-05-03 ENCOUNTER — Ambulatory Visit: Payer: Self-pay | Admitting: Endocrinology

## 2015-05-03 NOTE — Telephone Encounter (Signed)
I tried to contact the pt. I will try again at a later time.

## 2015-05-04 NOTE — Telephone Encounter (Signed)
Unable to reach the pt at this time.

## 2015-05-11 ENCOUNTER — Ambulatory Visit (INDEPENDENT_AMBULATORY_CARE_PROVIDER_SITE_OTHER): Payer: PPO | Admitting: Psychiatry

## 2015-05-11 ENCOUNTER — Encounter (HOSPITAL_COMMUNITY): Payer: Self-pay | Admitting: Psychiatry

## 2015-05-11 VITALS — BP 140/57 | HR 80 | Ht 67.0 in | Wt 283.0 lb

## 2015-05-11 DIAGNOSIS — F32A Depression, unspecified: Secondary | ICD-10-CM

## 2015-05-11 DIAGNOSIS — F329 Major depressive disorder, single episode, unspecified: Secondary | ICD-10-CM | POA: Diagnosis not present

## 2015-05-11 MED ORDER — FLUOXETINE HCL 20 MG PO CAPS: 20.0000 mg | ORAL_CAPSULE | Freq: Two times a day (BID) | ORAL | Status: DC
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
## 2015-05-16 ENCOUNTER — Encounter: Payer: PPO | Attending: Family Medicine | Admitting: Nutrition

## 2015-05-16 ENCOUNTER — Telehealth: Payer: Self-pay | Admitting: Endocrinology

## 2015-05-16 DIAGNOSIS — E119 Type 2 diabetes mellitus without complications: Secondary | ICD-10-CM | POA: Diagnosis not present

## 2015-05-16 DIAGNOSIS — Z713 Dietary counseling and surveillance: Secondary | ICD-10-CM | POA: Insufficient documentation

## 2015-05-16 MED ORDER — INSULIN NPH ISOPHANE & REGULAR (70-30) 100 UNIT/ML ~~LOC~~ SUSP: SUBCUTANEOUS | Status: DC

## 2015-05-16 NOTE — Telephone Encounter (Signed)
Pt called to report blood sugar readings. 8/19: Fasting 210  2 hours after eating lunch:274 2 hours after eating supper: 237   8/20: Fasting: 207 2 hours after eating lunch: 244 2 hours after eating supper: 234  8/21: Fasting: 236 2 hours after eating lunch: 219 2 hours after eating supper: 400  8/22:  Fasting: 118  2 hours after lunch: 383  Pt is taking 80 units of Levemir. Pt states the Relion insulin worked better for and would like to go back on that medication.  Please advise, Thanks!

## 2015-05-16 NOTE — Patient Instructions (Signed)
Goals: Talk to therapist to see about adjusting depression medications.  1. Eat thee balanced meals based on Plate Method. Avoid foods that are processed and high in sodium-bologna, sausage, biscuits, bacon and fast foods. 2. Increase fresh fruits and vegetables. 3. Cut out sweets, cakes, cookies, desserts and juices and SODA!!   4. Drink only water. Cut out snacks. 5. Avoid snacks between meals- Avoid foods high in sodium; biscuits, sausage, bacon, bologna and processed meats and fast foods.  Need to work on following diet to improve blood sugars. 6. Eat on schedule as discussed. 7. Get A1C down to 7.5% in three months. 8. Lose 1 lb per week. 9. Try water aerobics or exercise at the [facility] three times per week for 30 minutes each.

## 2015-05-16 NOTE — Telephone Encounter (Signed)
Patient ask you to call her about her medication. b/s is running high she afraid it is damaging her kidney, please advise

## 2015-05-16 NOTE — Progress Notes (Signed)
  Medical Nutrition Therapy:  Appt start time: 0930 end time:  1000.  Assessment:  Primary concerns today: Diabetess. MIssed appt with Dr. Loanne Drilling. BS log brough in. Husband with her.BS are 200-250's. Has cut down on sweets and soda and junk food .    He husband is with her today. Has fluid around her ankles and has problems with more shortness of breath over the weekend. She notes she husband says she 'rattles' some at night when breathing. A1C 9.1%, up from 8.9%. FBS:200's  Didn't bring meter or log with her. Doesn't feel good.Feels cold. Has been cutting back on sodas but hasn't cut them out totally. Not exercising. She is currently taking 40 units of Levermir twice a day. Diet remains inconsistent. Non compliant with diet regimen. Encouraged her to contact her cardiologist to discuss her symptoms to make sure she wasn't going into CHF with her heart history.   Preferred Learning Style:   Auditory  Visual  Hands on  No preference indicated   Learning Readiness:   Says she is ready.  Contemplating  MEDICATIONS: See list   DIETARY INTAKE:  24-hr recall:  B ( AM): Blueberry pancakes 2, chicken piece,, water, Cranberry juice L ( PM): Skipped.  Snk ( PM): none, water D ( PM):Chicken, corn, creamed potatoes, water Beverages: water, juice, soda,   Usual physical activity:  ADL's.  Estimated energy needs: 1500 calories 170 g carbohydrates 112 g protein 42 g fat  Progress Towards Goal(s):  In progress.   Nutritional Diagnosis:  NB-1.2 Harmful beliefs/attitudes about food or nutrition-related topics (use with caution) As related to Diabetes.  As evidenced by A1C 8.9% .    Intervention:  Nutrition counseling and diabetes education provided on low fat high fiber low sodium diet, CHO Counting, Meal planning, portion sizes, My Plate, target ranges for blood sugars,  Exercise and benefits of weight loss and complications related to uncontrolled DM. Stressed need for diet  compliance and recording blood sugars and bring meter with her at all visits. Reviewed low sodium diet and symptoms of fluid overload and when to contact PCP or cardiologist.  Goals: Talk to therapist to see about adjusting depression medications.  1. Eat thee balanced meals based on Plate Method. Avoid foods that are processed and high in sodium-bologna, sausage, biscuits, bacon and fast foods. 2. Increase fresh fruits and vegetables. 3. Cut out sweets, cakes, cookies, desserts and juices and SODA!!   4. Drink only water. Cut out snacks. 5. Avoid snacks between meals- Avoid foods high in sodium; biscuits, sausage, bacon, bologna and processed meats and fast foods.  Need to work on following diet to improve blood sugars. 6. Eat on schedule as discussed. 7. Get A1C down to 7.5% in three months. 8. Lose 1 lb per week. 9. Try water aerobics or exercise at the [facility] three times per week for 30 minutes each.   Teaching Method Utilized:  Visual Auditory Hands on  Handouts given during visit include:  The Plate Method  The Meal Plan Card  Diabetes Instructions  Barriers to learning/adherence to lifestyle change: None  Demonstrated degree of understanding via:  Teach Back   Monitoring/Evaluation:  Dietary intake, exercise, meal planning, SBG, and body weight in 1 month.

## 2015-05-16 NOTE — Telephone Encounter (Signed)
please call patient: Please change to 70/30 insulin.  i have sent a prescription to your pharmacy i'll see you next time.

## 2015-05-16 NOTE — Telephone Encounter (Signed)
Attempted to reach the pt. Will try again at a later time.  

## 2015-05-17 ENCOUNTER — Telehealth (HOSPITAL_COMMUNITY): Payer: Self-pay | Admitting: *Deleted

## 2015-05-17 MED ORDER — INSULIN NPH ISOPHANE & REGULAR (70-30) 100 UNIT/ML ~~LOC~~ SUSP: SUBCUTANEOUS | Status: DC

## 2015-05-17 NOTE — Telephone Encounter (Signed)
She denied being depressed last visit. Per nutrition note she isn't following diet plan and depression meds won't change her food choices!

## 2015-05-17 NOTE — Telephone Encounter (Signed)
Per pt, her nutritionist informed her to call Dr. Harrington Challenger office to see if she could adjust her depression medication. Per pt, her nutritionist felt like pt was having depression problems and felt like it was worst. Pt number [phone].

## 2015-05-17 NOTE — Telephone Encounter (Addendum)
Pt advised of note below and voiced understanding.  

## 2015-05-19 ENCOUNTER — Other Ambulatory Visit: Payer: Self-pay | Admitting: Family Medicine

## 2015-05-23 ENCOUNTER — Telehealth (HOSPITAL_COMMUNITY): Payer: Self-pay | Admitting: *Deleted

## 2015-05-23 ENCOUNTER — Ambulatory Visit (INDEPENDENT_AMBULATORY_CARE_PROVIDER_SITE_OTHER): Payer: PPO | Admitting: Endocrinology

## 2015-05-23 ENCOUNTER — Encounter: Payer: Self-pay | Admitting: Endocrinology

## 2015-05-23 ENCOUNTER — Ambulatory Visit: Payer: Self-pay | Admitting: Dietician

## 2015-05-23 VITALS — BP 130/60 | HR 84 | Temp 97.8°F | Ht 67.0 in | Wt 288.0 lb

## 2015-05-23 DIAGNOSIS — E1065 Type 1 diabetes mellitus with hyperglycemia: Secondary | ICD-10-CM

## 2015-05-23 DIAGNOSIS — E1165 Type 2 diabetes mellitus with hyperglycemia: Principal | ICD-10-CM

## 2015-05-23 DIAGNOSIS — IMO0001 Reserved for inherently not codable concepts without codable children: Secondary | ICD-10-CM

## 2015-05-23 DIAGNOSIS — Z794 Long term (current) use of insulin: Principal | ICD-10-CM

## 2015-05-23 LAB — POCT GLYCOSYLATED HEMOGLOBIN (HGB A1C): Hemoglobin A1C: 9.8

## 2015-05-23 MED ORDER — INSULIN NPH ISOPHANE & REGULAR (70-30) 100 UNIT/ML ~~LOC~~ SUSP: SUBCUTANEOUS | Status: DC

## 2015-05-23 NOTE — Patient Instructions (Addendum)
check your blood sugar 4 times a day: before the 3 meals, and at bedtime.  also check if you have symptoms of your blood sugar being too high or too low.  please keep a record of the readings and bring it to your next appointment here.  You can write it on any piece of paper.  please call us sooner if your blood sugar goes below 70, or if you have a lot of readings over 200.   Please increase the 70/30 to 50 units with breakfast and 20 units with supper.   This is probably not enough, so please call us next week, to tell us how the blood sugar is doing.   On this type of insulin schedule, you should eat meals on a regular schedule.  If a meal is missed or significantly delayed, your blood sugar could go low. Please come back for a follow-up appointment in 2 months.

## 2015-05-23 NOTE — Progress Notes (Signed)
Subjective:    Patient ID: Laura Atkins, female    DOB: [birth year], 74 y.o.   MRN: [id number]  HPI Pt returns for f/u of diabetes mellitus: DM type: insulin-requiring type 2.   Dx'ed: [id number] Complications: polyneuropathy, PAD, and foot ulcer.   Therapy: insulin since 2005.   GDM: never DKA: never Severe hypoglycemia: never Pancreatitis: never.   Other info: in early 2016, she changed to BID premixed insulin, due to poor results with multiple daily injections.  She could not afford levemir.   Interval history: She is back on 70/30.  she brings a record of her cbg's which i have reviewed today.  It varies from 198-300.  It is in general higher as the day goes on. Past Medical History  Diagnosis Date  . Hypertension   . Diabetes mellitus   . Hyperlipidemia   . Obesity   . Anemia   . Anxiety and depression   . GERD (gastroesophageal reflux disease)   . Pruritus   . Osteoarthritis     Left knee; right shoulder; chronic neck and back pain  . Obstructive sleep apnea   . Urinary incontinence   . Tremor     This started months ago after her seizure progressing to very poor hand writing  . Seizures   . Depression   . Anxiety   . [id number])     Past Surgical History  Procedure Laterality Date  . Abdominal hysterectomy    . Breast excisional biopsy      Left; cyst  . Cholecystectomy    . Cataract extraction w/ intraocular lens implant Left 09/07/2013  . Eye surgery Left 09/07/2013    cataract    Social History   Social History  . Marital Status: Married    Spouse Name: Laura Atkins  . Number of Children: 5  . Years of Education: 12   Occupational History  . retired     retired   Social History Main Topics  . Smoking status: Never Smoker   . Smokeless tobacco: Never Used  . Alcohol Use: No  . Drug Use: No  . Sexual Activity: Yes    Birth Control/ Protection: Surgical   Other Topics Concern  . Not on file   Social History Narrative   Patient lives at  home with her husband Laura Atkins).  Patient is retired.    Right handed.    Five Children.    Caffeine- 2 daily    Current Outpatient Prescriptions on File Prior to Visit  Medication Sig Dispense Refill  . acetaminophen (EXTRA STRENGTH PAIN RELIEF) 500 MG tablet Take 500 mg by mouth every 6 (six) hours as needed for pain.    Marland Kitchen amLODipine (NORVASC) 10 MG tablet TAKE 1 TABLET BY MOUTH EVERY DAY 90 tablet 0  . aspirin 81 MG tablet Take 81 mg by mouth daily.      Marland Kitchen atorvastatin (LIPITOR) 40 MG tablet TAKE 1 TABLET BY MOUTH EVERY DAY 90 tablet 0  . benazepril (LOTENSIN) 20 MG tablet TAKE 1 TABLET BY MOUTH EVERY DAILY 90 tablet 0  . FLUoxetine (PROZAC) 20 MG capsule Take 1 capsule (20 mg total) by mouth 2 (two) times daily. 60 capsule 3  . gabapentin (NEURONTIN) 400 MG capsule One capsule twice daily and two at bedtime 120 capsule 4  . glucose blood (ONE TOUCH ULTRA TEST) test strip Four times daily testing  DX E11.9 150 each 2  . hydrOXYzine (VISTARIL) 25 MG capsule TAKE 1 CAPSULE BY MOUTH AT  BEDTIME FOR ITCHING -- DO NOT FILL 50MG  DOSE AS PER DR KEB 09-06-14 30 capsule 2  . Insulin Syringe-Needle U-100 (B-D INS SYR ULTRAFINE 1CC/30G) 30G X 1/2" 1 ML MISC USE AS DIRECTED 100 each 2  . Multiple Vitamins-Minerals (CENTRUM SILVER ULTRA WOMENS PO) Take 1 tablet by mouth daily.     Marland Kitchen omeprazole (PRILOSEC) 20 MG capsule TAKE 1 CAPSULE BY MOUTH DAILY. 90 capsule 0  . potassium chloride SA (K-DUR,KLOR-CON) 20 MEQ tablet TAKE 1 TABLET BY MOUTH TWICE DAILY 60 tablet 5  . torsemide (DEMADEX) 20 MG tablet Take 2 tablets (40 mg total) by mouth 2 (two) times daily. 120 tablet 6  . VOLTAREN 1 % GEL APPLY 4 GRAMS TOPICALLY 4 TIMES DAILY. 100 g 0   No current facility-administered medications on file prior to visit.    Allergies  Allergen Reactions  . Penicillins Shortness Of Breath, Itching and Rash  . Prednisone Shortness Of Breath, Itching and Rash  . Propoxyphene N-Acetaminophen Itching and Nausea And  Vomiting    Family History  Problem Relation Age of Onset  . ADD / ADHD Grandchild   . Bipolar disorder Grandchild   . Bipolar disorder Daughter   . Alcohol abuse Neg Hx   . Drug abuse Neg Hx   . Cancer Mother 13    lung   . Kidney disease Father   . Diabetes Sister     BP 130/60 mmHg  Pulse 84  Temp(Src) 97.8 F (36.6 C) (Oral)  Ht 5\' 7"  (1.702 m)  Wt 288 lb (130.636 kg)  BMI 45.10 kg/m2  SpO2 96%  Review of Systems She denies hypoglycemia    Objective:   Physical Exam VITAL SIGNS:  See vs page GENERAL: no distress Pulses: dorsalis pedis intact bilat.   MSK: no deformity of the feet.   CV: 1+ bilat leg edema.   Skin: no ulcer on the feet. normal color and temp on the feet.  Neuro: sensation is intact to touch on the feet, but decreased from normal.   [id number] is bilateral onychomycosis of the toenails   A1c=9.8%.    Assessment & Plan:  DM: she needs increased rx  Patient is advised the following: Patient Instructions  check your blood sugar 4 times a day: before the 3 meals, and at bedtime.  also check if you have symptoms of your blood sugar being too high or too low.  please keep a record of the readings and bring it to your next appointment here.  You can write it on any piece of paper.  please call us sooner if your blood sugar goes below 70, or if you have a lot of readings over 200.   Please increase the 70/30 to 50 units with breakfast and 20 units with supper.   This is probably not enough, so please call us next week, to tell us how the blood sugar is doing.   On this type of insulin schedule, you should eat meals on a regular schedule.  If a meal is missed or significantly delayed, your blood sugar could go low. Please come back for a follow-up appointment in 2 months.

## 2015-05-23 NOTE — Telephone Encounter (Signed)
phone call from patient, her nutritionist said her Prozac need to be increased because she is getting frustrated and nervous.

## 2015-05-24 NOTE — Telephone Encounter (Signed)
Pt is aware of what Dr. Harrington Challenger stated and showed understanding

## 2015-05-24 NOTE — Telephone Encounter (Signed)
Informed pt of what Dr. Harrington Challenger stated and pt showed understanding

## 2015-06-06 ENCOUNTER — Telehealth: Payer: Self-pay

## 2015-06-06 NOTE — Telephone Encounter (Signed)
In house UA, and flu vac if she agrees pls

## 2015-06-07 NOTE — Telephone Encounter (Signed)
Noted.   Will access when patient comes in for nurse visit.

## 2015-06-13 ENCOUNTER — Encounter (INDEPENDENT_AMBULATORY_CARE_PROVIDER_SITE_OTHER): Payer: Self-pay | Admitting: *Deleted

## 2015-06-13 ENCOUNTER — Telehealth: Payer: Self-pay | Admitting: *Deleted

## 2015-06-13 DIAGNOSIS — Z1211 Encounter for screening for malignant neoplasm of colon: Secondary | ICD-10-CM

## 2015-06-13 NOTE — Telephone Encounter (Signed)
Pt called requesting a referral to Dr. [name]

## 2015-06-13 NOTE — Telephone Encounter (Signed)
Referral entered for colonoscopy

## 2015-06-20 ENCOUNTER — Telehealth: Payer: Self-pay | Admitting: Family Medicine

## 2015-06-20 ENCOUNTER — Other Ambulatory Visit: Payer: Self-pay | Admitting: Family Medicine

## 2015-06-20 NOTE — Telephone Encounter (Signed)
Patient will come in after lunch for nurse visit.

## 2015-06-20 NOTE — Telephone Encounter (Signed)
Patient is asking if it is ok if she comes today for a urine specimen, please advise?

## 2015-06-21 ENCOUNTER — Other Ambulatory Visit (INDEPENDENT_AMBULATORY_CARE_PROVIDER_SITE_OTHER): Payer: Self-pay | Admitting: *Deleted

## 2015-06-21 ENCOUNTER — Telehealth (INDEPENDENT_AMBULATORY_CARE_PROVIDER_SITE_OTHER): Payer: Self-pay | Admitting: *Deleted

## 2015-06-21 ENCOUNTER — Ambulatory Visit (INDEPENDENT_AMBULATORY_CARE_PROVIDER_SITE_OTHER): Payer: PPO

## 2015-06-21 DIAGNOSIS — Z1211 Encounter for screening for malignant neoplasm of colon: Secondary | ICD-10-CM

## 2015-06-21 DIAGNOSIS — N3001 Acute cystitis with hematuria: Secondary | ICD-10-CM | POA: Diagnosis not present

## 2015-06-21 LAB — POCT URINALYSIS DIPSTICK
Bilirubin, UA: NEGATIVE
Glucose, UA: 100
Ketones, UA: NEGATIVE
Nitrite, UA: POSITIVE
Protein, UA: 30
Spec Grav, UA: 1.02
Urobilinogen, UA: 0.2
pH, UA: 6

## 2015-06-21 MED ORDER — PEG 3350-KCL-NA BICARB-NACL 420 G PO SOLR: 4000.0000 mL | Freq: Once | ORAL | Status: DC

## 2015-06-21 MED ORDER — CIPROFLOXACIN HCL 500 MG PO TABS: 500.0000 mg | ORAL_TABLET | Freq: Two times a day (BID) | ORAL | Status: DC

## 2015-06-21 NOTE — Telephone Encounter (Signed)
Patient needs trilyte 

## 2015-06-21 NOTE — Progress Notes (Signed)
Patient in for nurse visit for urine check due to uti symptoms.  POCT done on urine specimen.  Results entered and urine sent for culture.  Cipro 500mg  BID #6 sent to pharmacy.  Patient aware.

## 2015-06-24 LAB — URINE CULTURE: Colony Count: >=100000

## 2015-06-28 ENCOUNTER — Telehealth (INDEPENDENT_AMBULATORY_CARE_PROVIDER_SITE_OTHER): Payer: Self-pay | Admitting: *Deleted

## 2015-06-28 ENCOUNTER — Encounter (INDEPENDENT_AMBULATORY_CARE_PROVIDER_SITE_OTHER): Payer: Self-pay | Admitting: *Deleted

## 2015-06-28 ENCOUNTER — Telehealth: Payer: Self-pay

## 2015-06-28 DIAGNOSIS — Z1211 Encounter for screening for malignant neoplasm of colon: Secondary | ICD-10-CM

## 2015-06-28 MED ORDER — LEVOFLOXACIN 500 MG PO TABS: 500.0000 mg | ORAL_TABLET | Freq: Every day | ORAL | Status: DC

## 2015-06-28 NOTE — Telephone Encounter (Signed)
I will send levaquin for 4 additional days , pls let her know, and yes, I have changed the cipro to levaquin

## 2015-06-28 NOTE — Telephone Encounter (Signed)
Patient needs suprep 

## 2015-06-28 NOTE — Telephone Encounter (Signed)
Referring MD/PCP: simpson   Procedure: tcs  Reason/Indication:  screening  Has patient had this procedure before?  Yes, 2004 -- epic  If so, when, by whom and where?    Is there a family history of colon cancer?  Not sure  Who?  What age when diagnosed?    Is patient diabetic?   yes      Does patient have prosthetic heart valve?  no  Do you have a pacemaker?  no  Has patient ever had endocarditis? no  Has patient had joint replacement within last 12 months?  no  Does patient tend to be constipated or take laxatives? sometime  Does patient have a history of alcohol/drug use? no  Is patient on Coumadin, Plavix and/or Aspirin? yes  Medications: asa 81 mg daily, acetaminophen 500 mg prn, novolog 15 units tid,  novolin 30 units nightly, gabapentin 400 mg tid, atorvastatin 40 mg daily, fluoxetine 20 mg bid, benazepril 20 mg daily, furosemide 40 mg bid, centrum silver daily, amlodipine 10 mg daily, omeprazole 20 mg daily,   Allergies: see epic  Medication Adjustment: asa 2 days decrease novolog to 5 units tid and novolin 15 units evening before  Procedure date & time: 07/20/15 at 930

## 2015-06-29 NOTE — Telephone Encounter (Signed)
Called multiple times to notify patient.   Unable to leave message because voicemail is not set up.   Will try again.

## 2015-06-29 NOTE — Telephone Encounter (Signed)
Levaquin  Was entered, and I think sent , please ensure sent , you should get this addendum

## 2015-06-30 NOTE — Telephone Encounter (Signed)
Called patient multiple times unable to reach.   Will send letter that medication has been sent to pharmacy.

## 2015-07-01 MED ORDER — SUPREP BOWEL PREP KIT 17.5-3.13-1.6 GM/177ML PO SOLN
1.0000 | Freq: Once | ORAL | Status: DC
Start: 2015-07-01 — End: 2015-07-20

## 2015-07-01 NOTE — Telephone Encounter (Signed)
agree

## 2015-07-12 ENCOUNTER — Ambulatory Visit (HOSPITAL_COMMUNITY): Payer: Self-pay | Admitting: Psychiatry

## 2015-07-18 ENCOUNTER — Other Ambulatory Visit: Payer: Self-pay | Admitting: Cardiology

## 2015-07-20 ENCOUNTER — Ambulatory Visit (HOSPITAL_COMMUNITY)
Admission: RE | Admit: 2015-07-20 | Discharge: 2015-07-20 | Disposition: A | Discharge status: Home / Self Care | Payer: PPO | Source: Ambulatory Visit | Attending: Internal Medicine | Admitting: Internal Medicine

## 2015-07-20 ENCOUNTER — Encounter (HOSPITAL_COMMUNITY)
Admission: RE | Disposition: A | Discharge status: Home / Self Care | Payer: Self-pay | Source: Ambulatory Visit | Attending: Internal Medicine

## 2015-07-20 ENCOUNTER — Encounter (HOSPITAL_COMMUNITY): Payer: Self-pay | Admitting: *Deleted

## 2015-07-20 DIAGNOSIS — E119 Type 2 diabetes mellitus without complications: Secondary | ICD-10-CM | POA: Insufficient documentation

## 2015-07-20 DIAGNOSIS — M19011 Primary osteoarthritis, right shoulder: Secondary | ICD-10-CM | POA: Insufficient documentation

## 2015-07-20 DIAGNOSIS — G4733 Obstructive sleep apnea (adult) (pediatric): Secondary | ICD-10-CM | POA: Diagnosis not present

## 2015-07-20 DIAGNOSIS — Z791 Long term (current) use of non-steroidal anti-inflammatories (NSAID): Secondary | ICD-10-CM | POA: Insufficient documentation

## 2015-07-20 DIAGNOSIS — Z961 Presence of intraocular lens: Secondary | ICD-10-CM | POA: Insufficient documentation

## 2015-07-20 DIAGNOSIS — K6389 Other specified diseases of intestine: Secondary | ICD-10-CM | POA: Diagnosis not present

## 2015-07-20 DIAGNOSIS — K5900 Constipation, unspecified: Secondary | ICD-10-CM | POA: Insufficient documentation

## 2015-07-20 DIAGNOSIS — Z79899 Other long term (current) drug therapy: Secondary | ICD-10-CM | POA: Insufficient documentation

## 2015-07-20 DIAGNOSIS — K633 Ulcer of intestine: Secondary | ICD-10-CM | POA: Diagnosis not present

## 2015-07-20 DIAGNOSIS — M179 Osteoarthritis of knee, unspecified: Secondary | ICD-10-CM | POA: Insufficient documentation

## 2015-07-20 DIAGNOSIS — K219 Gastro-esophageal reflux disease without esophagitis: Secondary | ICD-10-CM | POA: Insufficient documentation

## 2015-07-20 DIAGNOSIS — Z7982 Long term (current) use of aspirin: Secondary | ICD-10-CM | POA: Insufficient documentation

## 2015-07-20 DIAGNOSIS — R197 Diarrhea, unspecified: Secondary | ICD-10-CM | POA: Diagnosis not present

## 2015-07-20 DIAGNOSIS — F329 Major depressive disorder, single episode, unspecified: Secondary | ICD-10-CM | POA: Insufficient documentation

## 2015-07-20 DIAGNOSIS — R251 Tremor, unspecified: Secondary | ICD-10-CM | POA: Insufficient documentation

## 2015-07-20 DIAGNOSIS — Z794 Long term (current) use of insulin: Secondary | ICD-10-CM | POA: Insufficient documentation

## 2015-07-20 DIAGNOSIS — R569 Unspecified convulsions: Secondary | ICD-10-CM | POA: Diagnosis not present

## 2015-07-20 DIAGNOSIS — Z6838 Body mass index (BMI) 38.0-38.9, adult: Secondary | ICD-10-CM | POA: Insufficient documentation

## 2015-07-20 DIAGNOSIS — K644 Residual hemorrhoidal skin tags: Secondary | ICD-10-CM | POA: Insufficient documentation

## 2015-07-20 DIAGNOSIS — E669 Obesity, unspecified: Secondary | ICD-10-CM | POA: Diagnosis not present

## 2015-07-20 DIAGNOSIS — Z9842 Cataract extraction status, left eye: Secondary | ICD-10-CM | POA: Diagnosis not present

## 2015-07-20 DIAGNOSIS — K648 Other hemorrhoids: Secondary | ICD-10-CM | POA: Diagnosis not present

## 2015-07-20 DIAGNOSIS — Z1211 Encounter for screening for malignant neoplasm of colon: Secondary | ICD-10-CM

## 2015-07-20 DIAGNOSIS — I10 Essential (primary) hypertension: Secondary | ICD-10-CM | POA: Diagnosis not present

## 2015-07-20 DIAGNOSIS — E785 Hyperlipidemia, unspecified: Secondary | ICD-10-CM | POA: Diagnosis not present

## 2015-07-20 HISTORY — PX: COLONOSCOPY: SHX5424

## 2015-07-20 LAB — GLUCOSE, CAPILLARY: Glucose-Capillary: 176 mg/dL — ABNORMAL HIGH (ref 65–99)

## 2015-07-20 SURGERY — COLONOSCOPY
Anesthesia: Moderate Sedation

## 2015-07-20 MED ORDER — SODIUM CHLORIDE 0.9 % IV SOLN
INTRAVENOUS | Status: DC
  Administered 2015-07-20: 09:00:00 via INTRAVENOUS

## 2015-07-20 MED ORDER — MIDAZOLAM HCL 5 MG/5ML IJ SOLN
INTRAMUSCULAR | Status: DC | PRN
  Administered 2015-07-20 (×4): 2 mg via INTRAVENOUS

## 2015-07-20 MED ORDER — MEPERIDINE HCL 50 MG/ML IJ SOLN
INTRAMUSCULAR | Status: DC | PRN
  Administered 2015-07-20 (×2): 25 mg via INTRAVENOUS

## 2015-07-20 MED ORDER — MIDAZOLAM HCL 5 MG/5ML IJ SOLN
INTRAMUSCULAR | Status: AC
  Filled 2015-07-20: qty 10

## 2015-07-20 MED ORDER — MEPERIDINE HCL 50 MG/ML IJ SOLN
INTRAMUSCULAR | Status: AC
  Filled 2015-07-20: qty 1

## 2015-07-20 MED ORDER — STERILE WATER FOR IRRIGATION IR SOLN
Status: DC | PRN
  Administered 2015-07-20: 10:00:00

## 2015-07-20 NOTE — H&P (Signed)
Laura Atkins is an 74 y.o. female.   Chief Complaint: Patient is here for colonoscopy. HPI: Patient is 74 year old African-American female who is here for screening colonoscopy. She denies abdominal pain. She has irregular bowel movements. She has years of diarrhea with urgency and then she is constipated. There is no history of rectal bleeding. Last colonoscopy was in November 2004. Family history is negative for CRC.  Past Medical History  Diagnosis Date  . Hypertension   . Diabetes mellitus   . Hyperlipidemia   . Obesity   . Anemia   . Anxiety and depression   . GERD (gastroesophageal reflux disease)   . Pruritus   . Osteoarthritis     Left knee; right shoulder; chronic neck and back pain  . Obstructive sleep apnea   . Urinary incontinence   . Tremor     This started months ago after her seizure progressing to very poor hand writing  . Seizures ([place])   . Depression   . Anxiety   . MEBRAXEN(407.6)     Past Surgical History  Procedure Laterality Date  . Abdominal hysterectomy    . Breast excisional biopsy      Left; cyst  . Cholecystectomy    . Cataract extraction w/ intraocular lens implant Left 09/07/2013  . Eye surgery Left 09/07/2013    cataract  . Colonoscopy      Family History  Problem Relation Age of Onset  . ADD / ADHD Grandchild   . Bipolar disorder Grandchild   . Bipolar disorder Daughter   . Alcohol abuse Neg Hx   . Drug abuse Neg Hx   . Cancer Mother 75    lung   . Kidney disease Father   . Diabetes Sister    Social History:  reports that she has never smoked. She has never used smokeless tobacco. She reports that she does not drink alcohol or use illicit drugs.  Allergies:  Allergies  Allergen Reactions  . Penicillins Shortness Of Breath, Itching and Rash  . Prednisone Shortness Of Breath, Itching and Rash  . Propoxyphene N-Acetaminophen Itching and Nausea And Vomiting    Medications Prior to Admission  Medication Sig Dispense Refill  .  acetaminophen (EXTRA STRENGTH PAIN RELIEF) 500 MG tablet Take 500 mg by mouth every 6 (six) hours as needed for pain.    Marland Kitchen amLODipine (NORVASC) 10 MG tablet TAKE 1 TABLET BY MOUTH EVERY DAY 90 tablet 0  . aspirin 81 MG tablet Take 81 mg by mouth daily.      Marland Kitchen atorvastatin (LIPITOR) 40 MG tablet TAKE 1 TABLET BY MOUTH EVERY DAY 90 tablet 0  . benazepril (LOTENSIN) 20 MG tablet TAKE 1 TABLET BY MOUTH EVERY DAILY 90 tablet 0  . diphenhydrAMINE (BENADRYL) 25 mg capsule Take 25 mg by mouth every 6 (six) hours as needed.    Marland Kitchen FLUoxetine (PROZAC) 20 MG capsule Take 1 capsule (20 mg total) by mouth 2 (two) times daily. 60 capsule 3  . gabapentin (NEURONTIN) 400 MG capsule One capsule twice daily and two at bedtime 120 capsule 4  . insulin NPH-regular Human (NOVOLIN 70/30) (70-30) 100 UNIT/ML injection 50 units with breakfast and 20 units with supper, and syringes 2/day. 20 mL 11  . Multiple Vitamins-Minerals (CENTRUM SILVER ULTRA WOMENS PO) Take 1 tablet by mouth daily.     Marland Kitchen omeprazole (PRILOSEC) 20 MG capsule TAKE 1 CAPSULE BY MOUTH DAILY. 90 capsule 0  . potassium chloride SA (K-DUR,KLOR-CON) 20 MEQ tablet TAKE 1  TABLET BY MOUTH TWICE DAILY 60 tablet 5  . SUPREP BOWEL PREP SOLN Take 1 kit by mouth once. 1 Bottle 0  . torsemide (DEMADEX) 20 MG tablet TAKE 2 TABLETS BY MOUTH TWICE DAILY -STOP LASIX 120 tablet 6  . VOLTAREN 1 % GEL APPLY 4 GRAMS TOPICALLY 4 TIMES DAILY. 100 g 0  . ciprofloxacin (CIPRO) 500 MG tablet Take 1 tablet (500 mg total) by mouth 2 (two) times daily. (Patient not taking: Reported on 07/19/2015) 6 tablet 0  . glucose blood (ONE TOUCH ULTRA TEST) test strip Four times daily testing  DX E11.9 (Patient not taking: Reported on 07/19/2015) 150 each 2  . hydrOXYzine (VISTARIL) 25 MG capsule TAKE 1 CAPSULE BY MOUTH AT BEDTIME FOR ITCHING -- DO NOT FILL 50MG DOSE AS PER DR KEB 09-06-14 (Patient not taking: Reported on 07/19/2015) 30 capsule 2  . Insulin Syringe-Needle U-100 (B-D INS SYR  ULTRAFINE 1CC/30G) 30G X 1/2" 1 ML MISC USE AS DIRECTED (Patient not taking: Reported on 07/19/2015) 100 each 2  . levofloxacin (LEVAQUIN) 500 MG tablet Take 1 tablet (500 mg total) by mouth daily. (Patient not taking: Reported on 07/19/2015) 4 tablet 0  . polyethylene glycol-electrolytes (NULYTELY/GOLYTELY) 420 G solution Take 4,000 mLs by mouth once. (Patient not taking: Reported on 07/19/2015) 4000 mL 0    Results for orders placed or performed during the hospital encounter of 07/20/15 (from the past 48 hour(s))  Glucose, capillary     Status: Abnormal   Collection Time: 07/20/15  8:55 AM  Result Value Ref Range   Glucose-Capillary 176 (H) 65 - 99 mg/dL   No results found.  ROS  Blood pressure 152/66, pulse 79, temperature 98 F (36.7 C), temperature source Oral, resp. rate 15, height 6' (1.829 m), weight 287 lb (130.182 kg), SpO2 99 %. Physical Exam  Constitutional: She appears well-developed and well-nourished.  HENT:  Mouth/Throat: Oropharynx is clear and moist.  Eyes: Conjunctivae are normal. No scleral icterus.  Neck: No thyromegaly present.  Cardiovascular: Normal rate, regular rhythm and normal heart sounds.   No murmur heard. Respiratory: Effort normal and breath sounds normal.  GI: Soft. She exhibits no distension and no mass. There is no tenderness.  Lower midline scar  Musculoskeletal: She exhibits no edema.  Lymphadenopathy:    She has no cervical adenopathy.  Neurological: She is alert.  Skin: Skin is warm and dry.     Assessment/Plan Average risk screening colonoscopy.  [name],[name] 07/20/2015, 9:34 AM

## 2015-07-20 NOTE — Discharge Instructions (Signed)
Resume usual medications and high fiber diet. No driving for 24 hours. Physician will call with biopsy results.   Colonoscopy, Care After These instructions give you information on caring for yourself after your procedure. Your doctor may also give you more specific instructions. Call your doctor if you have any problems or questions after your procedure. HOME CARE  Do not drive for 24 hours.  Do not sign important papers or use machinery for 24 hours.  You may shower.  You may go back to your usual activities, but go slower for the first 24 hours.  Take rest breaks often during the first 24 hours.  Walk around or use warm packs on your belly (abdomen) if you have belly cramping or gas.  Drink enough fluids to keep your pee (urine) clear or pale yellow.  Resume your normal diet. Avoid heavy or fried foods.  Avoid drinking alcohol for 24 hours or as told by your doctor.  Only take medicines as told by your doctor. If a tissue sample (biopsy) was taken during the procedure:   Do not take aspirin or blood thinners for 7 days, or as told by your doctor.  Do not drink alcohol for 7 days, or as told by your doctor.  Eat soft foods for the first 24 hours. GET HELP IF: You still have a small amount of blood in your poop (stool) 2-3 days after the procedure. GET HELP RIGHT AWAY IF:  You have more than a small amount of blood in your poop.  You see clumps of tissue (blood clots) in your poop.  Your belly is puffy (swollen).  You feel sick to your stomach (nauseous) or throw up (vomit).  You have a fever.  You have belly pain that gets worse and medicine does not help. MAKE SURE YOU:  Understand these instructions.  Will watch your condition.  Will get help right away if you are not doing well or get worse.   This information is not intended to replace advice given to you by your health care provider. Make sure you discuss any questions you have with your health care  provider.   Document Released: 10/13/2010 Document Revised: 09/15/2013 Document Reviewed: 05/18/2013 Elsevier Interactive Patient Education 2016 Elsevier Inc.   High-Fiber Diet Fiber, also called dietary fiber, is a type of carbohydrate found in fruits, vegetables, whole grains, and beans. A high-fiber diet can have many health benefits. Your health care provider may recommend a high-fiber diet to help:  Prevent constipation. Fiber can make your bowel movements more regular.  Lower your cholesterol.  Relieve hemorrhoids, uncomplicated diverticulosis, or irritable bowel syndrome.  Prevent overeating as part of a weight-loss plan.  Prevent heart disease, type 2 diabetes, and certain cancers. WHAT IS MY PLAN? The recommended daily intake of fiber includes:  38 grams for men under age 88.  37 grams for men over age 88.  36 grams for women under age 57.  21 grams for women over age 43. You can get the recommended daily intake of dietary fiber by eating a variety of fruits, vegetables, grains, and beans. Your health care provider may also recommend a fiber supplement if it is not possible to get enough fiber through your diet. WHAT DO I NEED TO KNOW ABOUT A HIGH-FIBER DIET?  Fiber supplements have not been widely studied for their effectiveness, so it is better to get fiber through food sources.  Always check the fiber content on thenutrition facts label of any prepackaged food. Look  for foods that contain at least 5 grams of fiber per serving.  Ask your dietitian if you have questions about specific foods that are related to your condition, especially if those foods are not listed in the following section.  Increase your daily fiber consumption gradually. Increasing your intake of dietary fiber too quickly may cause bloating, cramping, or gas.  Drink plenty of water. Water helps you to digest fiber. WHAT FOODS CAN I EAT? Grains Whole-grain breads. Multigrain cereal. Oats and  oatmeal. Brown rice. Barley. Bulgur wheat. [place]. Bran muffins. Popcorn. Rye wafer crackers. Vegetables Sweet potatoes. Spinach. Kale. Artichokes. Cabbage. Broccoli. Green peas. Carrots. Squash. Fruits Berries. Pears. Apples. Oranges. Avocados. Prunes and raisins. Dried figs. Meats and Other Protein Sources Navy, kidney, pinto, and soy beans. Split peas. Lentils. Nuts and seeds. Dairy Fiber-fortified yogurt. Beverages Fiber-fortified soy milk. Fiber-fortified orange juice. Other Fiber bars. The items listed above may not be a complete list of recommended foods or beverages. Contact your dietitian for more options. WHAT FOODS ARE NOT RECOMMENDED? Grains White bread. Pasta made with refined flour. White rice. Vegetables Fried potatoes. Canned vegetables. Well-cooked vegetables.  Fruits Fruit juice. Cooked, strained fruit. Meats and Other Protein Sources Fatty cuts of meat. Fried Sales executive or fried fish. Dairy Milk. Yogurt. Cream cheese. Sour cream. Beverages Soft drinks. Other Cakes and pastries. Butter and oils. The items listed above may not be a complete list of foods and beverages to avoid. Contact your dietitian for more information. WHAT ARE SOME TIPS FOR INCLUDING HIGH-FIBER FOODS IN MY DIET?  Eat a wide variety of high-fiber foods.  Make sure that half of all grains consumed each day are whole grains.  Replace breads and cereals made from refined flour or white flour with whole-grain breads and cereals.  Replace white rice with brown rice, bulgur wheat, or millet.  Start the day with a breakfast that is high in fiber, such as a cereal that contains at least 5 grams of fiber per serving.  Use beans in place of meat in soups, salads, or pasta.  Eat high-fiber snacks, such as berries, raw vegetables, nuts, or popcorn.   This information is not intended to replace advice given to you by your health care provider. Make sure you discuss any questions you have with your  health care provider.   Document Released: 09/10/2005 Document Revised: 10/01/2014 Document Reviewed: 02/23/2014 Elsevier Interactive Patient Education [employer].

## 2015-07-20 NOTE — Op Note (Signed)
COLONOSCOPY PROCEDURE REPORT  PATIENT:  Laura Atkins  MR#:  [id number] Birthdate:  1941-05-24, 74 y.o., female Endoscopist:  Dr. Rogene Houston, MD Referred By:  Dr. Tula Nakayama, MD  Procedure Date: 07/20/2015  Procedure:   Colonoscopy  Indications:  Patient is 74 year old African-American female who is here for average risk screening colonoscopy. Her. Her last exam was 12 years ago. She has irregular bowel movements with diarrhea urgency and then she is constipated. Her symptoms are felt to be typical of IBS.  Informed Consent:  The procedure and risks were reviewed with the patient and informed consent was obtained.  Medications:  Demerol 50 mg IV Versed 8 mg IV  Description of procedure:  After a digital rectal exam was performed, that colonoscope was advanced from the anus through the rectum and colon to the area of the cecum, ileocecal valve and appendiceal orifice. The cecum was deeply intubated. These structures were well-seen and photographed for the record. From the level of the cecum and ileocecal valve, the scope was slowly and cautiously withdrawn. The mucosal surfaces were carefully surveyed utilizing scope tip to flexion to facilitate fold flattening as needed. The scope was pulled down into the rectum where a thorough exam including retroflexion was performed.  Findings:   Prep suboptimal. She had a lot of thick liquid stool scattered throughout the colon. Cecal landmarks well seen. 25 mm flat ulcerated lesion noted next to ileocecal valve. Multiple biopsies taken. Mucosa of rest of the colon was normal. Normal rectal mucosa. Small hemorrhoids below the dentate line.   Therapeutic/Diagnostic Maneuvers Performed:  See above  Complications:  None  EBL: minimal  Cecal Withdrawal Time:  7 minutes  Impression:  Examination performed to cecum. 25 mm flat ulcerated lesion next ileocecal valve suspicious for carcinoma. Multiple biopsies taken. Small external  hemorrhoids.  Recommendations:  Standard instructions given. High fiber diet. I will contact patient with biopsy results and further recommendations.  Murvin Gift U  07/20/2015 10:33 AM  CC: Dr. Tula Nakayama, MD & Dr. Rayne Du ref. provider found

## 2015-07-22 ENCOUNTER — Encounter (HOSPITAL_COMMUNITY): Payer: Self-pay | Admitting: Internal Medicine

## 2015-07-22 ENCOUNTER — Telehealth (INDEPENDENT_AMBULATORY_CARE_PROVIDER_SITE_OTHER): Payer: Self-pay | Admitting: *Deleted

## 2015-07-22 NOTE — Telephone Encounter (Signed)
Joaquim Lai returning Dr. Olevia Perches call. Her husband said someone called but could not remember. He does have trouble remembering. The return phone number is [phone]

## 2015-07-25 ENCOUNTER — Ambulatory Visit: Payer: Self-pay | Admitting: Endocrinology

## 2015-07-26 NOTE — Telephone Encounter (Signed)
Already addressed

## 2015-07-27 ENCOUNTER — Other Ambulatory Visit (INDEPENDENT_AMBULATORY_CARE_PROVIDER_SITE_OTHER): Payer: Self-pay | Admitting: Internal Medicine

## 2015-07-27 DIAGNOSIS — K639 Disease of intestine, unspecified: Secondary | ICD-10-CM

## 2015-07-29 ENCOUNTER — Ambulatory Visit (INDEPENDENT_AMBULATORY_CARE_PROVIDER_SITE_OTHER): Payer: PPO | Admitting: Endocrinology

## 2015-07-29 ENCOUNTER — Encounter: Payer: Self-pay | Admitting: Endocrinology

## 2015-07-29 VITALS — BP 146/66 | HR 90 | Temp 97.9°F | Ht 67.0 in | Wt 293.0 lb

## 2015-07-29 DIAGNOSIS — E109 Type 1 diabetes mellitus without complications: Secondary | ICD-10-CM | POA: Diagnosis not present

## 2015-07-29 DIAGNOSIS — E1065 Type 1 diabetes mellitus with hyperglycemia: Principal | ICD-10-CM

## 2015-07-29 DIAGNOSIS — IMO0001 Reserved for inherently not codable concepts without codable children: Secondary | ICD-10-CM

## 2015-07-29 LAB — POCT GLYCOSYLATED HEMOGLOBIN (HGB A1C): Hemoglobin A1C: 8.6

## 2015-07-29 MED ORDER — INSULIN NPH ISOPHANE & REGULAR (70-30) 100 UNIT/ML ~~LOC~~ SUSP: SUBCUTANEOUS | Status: DC

## 2015-07-29 NOTE — Patient Instructions (Addendum)
check your blood sugar 4 times a day: before the 3 meals, and at bedtime.  also check if you have symptoms of your blood sugar being too high or too low.  please keep a record of the readings and bring it to your next appointment here.  You can write it on any piece of paper.  please call us sooner if your blood sugar goes below 70, or if you have a lot of readings over 200.  You do qualify for the diabetic shoes.  I am happy to sign the medical necessity form, but I can't sign another provider's note.  Please increase the insulin to 50 units with breakfast and 25 units with supper.  For your safety, take this insulin right before you eat, and only when you eat the first and last meals of the day.  On this type of insulin schedule, you should eat meals on a regular schedule.  If a meal is missed or significantly delayed, your blood sugar could go low. Please come back for a follow-up appointment in 3 months.

## 2015-07-29 NOTE — Progress Notes (Signed)
Subjective:    Patient ID: Laura Atkins, female    DOB: July 23, 1941, 74 y.o.   MRN: [id number]  HPI Pt returns for f/u of diabetes mellitus: DM type: insulin-requiring type 2.   Dx'ed: [id number] Complications: polyneuropathy, PAD, and foot ulcer.   Therapy: insulin since 2005.   GDM: never DKA: never Severe hypoglycemia: never Pancreatitis: never.   Other info: in early 2016, she changed to BID premixed insulin, due to poor results with multiple daily injections.  She could not afford levemir.   Interval history: she brings a record of her cbg's which i have reviewed today.  It varies from 67-305.  She says it is lowest when a meal is missed or delayed.  She says she often forgets the pm dosage until well after the evening meal.   Past Medical History  Diagnosis Date  . Hypertension   . Diabetes mellitus   . Hyperlipidemia   . Obesity   . Anemia   . Anxiety and depression   . GERD (gastroesophageal reflux disease)   . Pruritus   . Osteoarthritis     Left knee; right shoulder; chronic neck and back pain  . Obstructive sleep apnea   . Urinary incontinence   . Tremor     This started months ago after her seizure progressing to very poor hand writing  . Seizures ([place])   . Depression   . Anxiety   . [id number])     Past Surgical History  Procedure Laterality Date  . Abdominal hysterectomy    . Breast excisional biopsy      Left; cyst  . Cholecystectomy    . Cataract extraction w/ intraocular lens implant Left 09/07/2013  . Eye surgery Left 09/07/2013    cataract  . Colonoscopy    . Colonoscopy N/A 07/20/2015    Procedure: COLONOSCOPY;  Surgeon: Rogene Houston, MD;  Location: [facility];  Service: Endoscopy;  Laterality: N/A;  930    Social History   Social History  . Marital Status: Married    Spouse Name: saunders  . Number of Children: 5  . Years of Education: 12   Occupational History  . retired     retired   Social History Main Topics  . Smoking  status: Never Smoker   . Smokeless tobacco: Never Used  . Alcohol Use: No  . Drug Use: No  . Sexual Activity: Yes    Birth Control/ Protection: Surgical   Other Topics Concern  . Not on file   Social History Narrative   Patient lives at home with her husband [name].  Patient is retired.    Right handed.    Five Children.    Caffeine- 2 daily    Current Outpatient Prescriptions on File Prior to Visit  Medication Sig Dispense Refill  . acetaminophen (EXTRA STRENGTH PAIN RELIEF) 500 MG tablet Take 500 mg by mouth every 6 (six) hours as needed for pain.    Marland Kitchen amLODipine (NORVASC) 10 MG tablet TAKE 1 TABLET BY MOUTH EVERY DAY 90 tablet 0  . aspirin 81 MG tablet Take 81 mg by mouth daily.      Marland Kitchen atorvastatin (LIPITOR) 40 MG tablet TAKE 1 TABLET BY MOUTH EVERY DAY 90 tablet 0  . benazepril (LOTENSIN) 20 MG tablet TAKE 1 TABLET BY MOUTH EVERY DAILY 90 tablet 0  . diphenhydrAMINE (BENADRYL) 25 mg capsule Take 25 mg by mouth every 6 (six) hours as needed.    Marland Kitchen FLUoxetine (PROZAC) 20  MG capsule Take 1 capsule (20 mg total) by mouth 2 (two) times daily. 60 capsule 3  . gabapentin (NEURONTIN) 400 MG capsule One capsule twice daily and two at bedtime 120 capsule 4  . Multiple Vitamins-Minerals (CENTRUM SILVER ULTRA WOMENS PO) Take 1 tablet by mouth daily.     Marland Kitchen omeprazole (PRILOSEC) 20 MG capsule TAKE 1 CAPSULE BY MOUTH DAILY. 90 capsule 0  . potassium chloride SA (K-DUR,KLOR-CON) 20 MEQ tablet TAKE 1 TABLET BY MOUTH TWICE DAILY 60 tablet 5  . torsemide (DEMADEX) 20 MG tablet TAKE 2 TABLETS BY MOUTH TWICE DAILY -STOP LASIX 120 tablet 6  . VOLTAREN 1 % GEL APPLY 4 GRAMS TOPICALLY 4 TIMES DAILY. 100 g 0   No current facility-administered medications on file prior to visit.    Allergies  Allergen Reactions  . Penicillins Shortness Of Breath, Itching and Rash  . Prednisone Shortness Of Breath, Itching and Rash  . Propoxyphene N-Acetaminophen Itching and Nausea And Vomiting    Family  History  Problem Relation Age of Onset  . ADD / ADHD Grandchild   . Bipolar disorder Grandchild   . Bipolar disorder Daughter   . Alcohol abuse Neg Hx   . Drug abuse Neg Hx   . Cancer Mother 12    lung   . Kidney disease Father   . Diabetes Sister     BP 146/66 mmHg  Pulse 90  Temp(Src) 97.9 F (36.6 C) (Oral)  Ht 5\' 7"  (1.702 m)  Wt 293 lb (132.904 kg)  BMI 45.88 kg/m2  SpO2 95%  Review of Systems Denies LOC    Objective:   Physical Exam VITAL SIGNS:  See vs page GENERAL: no distress Pulses: dorsalis pedis intact bilat.   MSK: no deformity of the feet.  CV: 1+ bilat leg edema.  Skin: no ulcer on the feet. normal color and temp on the feet.  Neuro: sensation is intact to touch on the feet, but decreased from normal.  [id number] is bilateral onychomycosis of the toenails.     A1c=8.6%    Assessment & Plan:  DM: she needs increased rx Noncompliance with cbg recording: I advised pt to change to qd insulin to help this. Foot ulcer, by hx.  I told patient she qualifies for DM shoes.   Patient is advised the following: Patient Instructions  check your blood sugar 4 times a day: before the 3 meals, and at bedtime.  also check if you have symptoms of your blood sugar being too high or too low.  please keep a record of the readings and bring it to your next appointment here.  You can write it on any piece of paper.  please call us sooner if your blood sugar goes below 70, or if you have a lot of readings over 200.  You do qualify for the diabetic shoes.  I am happy to sign the medical necessity form, but I can't sign another provider's note.  Please increase the insulin to 50 units with breakfast and 25 units with supper.  For your safety, take this insulin right before you eat, and only when you eat the first and last meals of the day.  On this type of insulin schedule, you should eat meals on a regular schedule.  If a meal is missed or significantly delayed, your blood  sugar could go low. Please come back for a follow-up appointment in 3 months.

## 2015-08-03 ENCOUNTER — Other Ambulatory Visit (HOSPITAL_COMMUNITY): Payer: Self-pay

## 2015-08-04 ENCOUNTER — Ambulatory Visit (HOSPITAL_COMMUNITY)
Admission: RE | Admit: 2015-08-04 | Discharge: 2015-08-04 | Disposition: A | Discharge status: Home / Self Care | Payer: PPO | Source: Ambulatory Visit | Attending: Internal Medicine | Admitting: Internal Medicine

## 2015-08-04 DIAGNOSIS — K639 Disease of intestine, unspecified: Secondary | ICD-10-CM | POA: Diagnosis not present

## 2015-08-04 DIAGNOSIS — I700 Atherosclerosis of aorta: Secondary | ICD-10-CM | POA: Diagnosis not present

## 2015-08-04 LAB — POCT I-STAT CREATININE: Creatinine, Ser: 1.1 mg/dL — ABNORMAL HIGH (ref 0.44–1.00)

## 2015-08-04 MED ORDER — IOHEXOL 300 MG/ML  SOLN
100.0000 mL | Freq: Once | INTRAMUSCULAR | Status: AC | PRN
  Administered 2015-08-04: 100 mL via INTRAVENOUS

## 2015-08-10 ENCOUNTER — Encounter: Payer: Self-pay | Admitting: Family Medicine

## 2015-08-10 ENCOUNTER — Ambulatory Visit (INDEPENDENT_AMBULATORY_CARE_PROVIDER_SITE_OTHER): Payer: PPO | Admitting: Family Medicine

## 2015-08-10 VITALS — BP 130/64 | HR 77 | Resp 16 | Ht 67.0 in | Wt 294.4 lb

## 2015-08-10 DIAGNOSIS — Z0000 Encounter for general adult medical examination without abnormal findings: Secondary | ICD-10-CM

## 2015-08-10 DIAGNOSIS — Z23 Encounter for immunization: Secondary | ICD-10-CM

## 2015-08-10 NOTE — Patient Instructions (Addendum)
F/u in 4.5 month, call if you need me before  Flu shot today

## 2015-08-10 NOTE — Assessment & Plan Note (Signed)

## 2015-08-10 NOTE — Progress Notes (Signed)
Subjective:    Patient ID: Laura Atkins, female    DOB: August 19, 1941, 74 y.o.   MRN: [id number]  HPI Preventive Screening-Counseling & Management   Patient present here today for a Medicare annual wellness visit.   Current Problems (verified)   Medications Prior to Visit Allergies (verified)   PAST HISTORY  Family History (verified)   Social History Married for 17 years, 5 children, never smoker retired/disabled from [place] [employer]    Risk Factors  Current exercise habits:  No current exercise   Dietary issues discussed: heart healthy, low fat, low carbs, eats more baked foods    Cardiac risk factors: Type 2 diabetes   Depression Screen  (Note: if answer to either of the following is "Yes", a more complete depression screening is indicated)  Depression managed by psychiatry, also has had recent involvement in women's group as well as with [facility] group which has been very helpful Over the past two weeks, have you felt down, depressed or hopeless? No  Over the past two weeks, have you felt little interest or pleasure in doing things? No  Have you lost interest or pleasure in daily life? No  Do you often feel hopeless? No  Do you cry easily over simple problems? No   Activities of Daily Living  In your present state of health, do you have any difficulty performing the following activities?  Driving?: doesn't drive  Managing money?: No Feeding yourself?:No Getting from bed to chair?:yes Climbing a flight of stairs?:unable  Preparing food and eating?:some difficulty with food prep, sits to do this Bathing or showering?: sometimes needs assistance. Uses shower seat  Getting dressed?:Needs help, shoulder movement limited Getting to the toilet?:yes and has incontinence Using the toilet?:No Moving around from place to place?: has to use a cane/walker to get around.. Needs a new claw cane   Fall Risk Assessment In the past year have you fallen or had a near fall?:No Are  you currently taking any medications that make you dizzy?:No   Hearing Difficulties: No Do you often ask people to speak up or repeat themselves?:No Do you experience ringing or noises in your ears?:No Do you have difficulty understanding soft or whispered voices?:No  Cognitive Testing  Alert? Yes Normal Appearance?Yes  Oriented to person? Yes Place? Yes  Time? Yes  Displays appropriate judgment?Yes  Can read the correct time from a watch face? yes Are you having problems remembering things?No  Advanced Directives have been discussed with the patient?Yes, brochure given    List the Names of Other Physician/Practitioners you currently use:  [facility] (endo)  [facility] (psych)  Harrington Challenger (Psych)    Indicate any recent Medical Services you may have received from other than [facility] providers in the past year (date may be approximate).   Assessment:    Annual Wellness Exam   Plan:     Medicare Attestation  I have personally reviewed:  The patient's medical and social history  Their use of alcohol, tobacco or illicit drugs  Their current medications and supplements  The patient's functional ability including ADLs,fall risks, home safety risks, cognitive, and hearing and visual impairment  Diet and physical activities  Evidence for depression or mood disorders  The patient's weight, height, BMI, and visual acuity have been recorded in the chart. I have made referrals, counseling, and provided education to the patient based on review of the above and I have provided the patient with a written personalized care plan for preventive services.  Review of Systems     Objective:   Physical Exam BP 130/64 mmHg  Pulse 77  Resp 16  Ht 5\' 7"  (1.702 m)  Wt 294 lb 6.4 oz (133.539 kg)  BMI 46.10 kg/m2  SpO2 99%        Assessment & Plan:  Medicare annual wellness visit, subsequent Annual exam as documented. Counseling done  re healthy lifestyle involving commitment to 150 minutes  exercise per week, heart healthy diet, and attaining healthy weight.The importance of adequate sleep also discussed. Regular seat belt use and home safety, is also discussed. Changes in health habits are decided on by the patient with goals and time frames  set for achieving them. Immunization and cancer screening needs are specifically addressed at this visit.

## 2015-08-11 ENCOUNTER — Ambulatory Visit (INDEPENDENT_AMBULATORY_CARE_PROVIDER_SITE_OTHER): Payer: PPO | Admitting: Ophthalmology

## 2015-08-12 ENCOUNTER — Ambulatory Visit (INDEPENDENT_AMBULATORY_CARE_PROVIDER_SITE_OTHER): Payer: PPO | Admitting: Psychiatry

## 2015-08-12 ENCOUNTER — Encounter (HOSPITAL_COMMUNITY): Payer: Self-pay | Admitting: Psychiatry

## 2015-08-12 ENCOUNTER — Encounter: Payer: Self-pay | Admitting: Family Medicine

## 2015-08-12 VITALS — Ht 67.0 in | Wt 295.0 lb

## 2015-08-12 DIAGNOSIS — F32A Depression, unspecified: Secondary | ICD-10-CM

## 2015-08-12 DIAGNOSIS — F329 Major depressive disorder, single episode, unspecified: Secondary | ICD-10-CM | POA: Diagnosis not present

## 2015-08-12 MED ORDER — SERTRALINE HCL 100 MG PO TABS: 100.0000 mg | ORAL_TABLET | Freq: Two times a day (BID) | ORAL | Status: DC
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
## 2015-08-17 ENCOUNTER — Other Ambulatory Visit (HOSPITAL_COMMUNITY): Payer: Self-pay | Admitting: Psychiatry

## 2015-08-17 ENCOUNTER — Encounter (INDEPENDENT_AMBULATORY_CARE_PROVIDER_SITE_OTHER): Payer: Self-pay | Admitting: *Deleted

## 2015-08-23 ENCOUNTER — Encounter: Payer: Self-pay | Admitting: Cardiology

## 2015-08-23 ENCOUNTER — Ambulatory Visit (INDEPENDENT_AMBULATORY_CARE_PROVIDER_SITE_OTHER): Payer: PPO | Admitting: Cardiology

## 2015-08-23 VITALS — BP 124/71 | HR 79 | Ht 67.0 in | Wt 294.0 lb

## 2015-08-23 DIAGNOSIS — M7989 Other specified soft tissue disorders: Secondary | ICD-10-CM

## 2015-08-23 DIAGNOSIS — E785 Hyperlipidemia, unspecified: Secondary | ICD-10-CM

## 2015-08-23 DIAGNOSIS — I10 Essential (primary) hypertension: Secondary | ICD-10-CM

## 2015-08-23 NOTE — Progress Notes (Signed)
Patient ID: ALDONA SONNEK, female   DOB: 08-16-1941, 74 y.o.   MRN: [id number]     Clinical Summary Ms. Dragoo is a 74 y.o.female seen today for follow up of the following medical problems.   1. Leg swelling - swelling is up and down. Has only been taking torsemide 20mg  in AM as opposed ot the 40mg  bid prescribed.  - weight is up 9 lbs since last visit   2. Leg pain - cramping pain in back of legs with exertion. No rest pain, no sores on feet - ABIs 12/2014 were normal  3. HTN - does not check at home - compliant with meds  4. HL - compliant with statin.  Last panel 11/2014 TC 154 TG 108 HDL 67 LDL 65     Past Medical History  Diagnosis Date  . Hypertension   . Diabetes mellitus   . Hyperlipidemia   . Obesity   . Anemia   . Anxiety and depression   . GERD (gastroesophageal reflux disease)   . Pruritus   . Osteoarthritis     Left knee; right shoulder; chronic neck and back pain  . Obstructive sleep apnea   . Urinary incontinence   . Tremor     This started months ago after her seizure progressing to very poor hand writing  . Seizures ([place])   . Depression   . Anxiety   . Headache(784.0)      Allergies  Allergen Reactions  . Penicillins Shortness Of Breath, Itching and Rash  . Prednisone Shortness Of Breath, Itching and Rash  . Propoxyphene N-Acetaminophen Itching and Nausea And Vomiting     Current Outpatient Prescriptions  Medication Sig Dispense Refill  . acetaminophen (EXTRA STRENGTH PAIN RELIEF) 500 MG tablet Take 500 mg by mouth every 6 (six) hours as needed for pain.    Marland Kitchen amLODipine (NORVASC) 10 MG tablet TAKE 1 TABLET BY MOUTH EVERY DAY 90 tablet 0  . aspirin 81 MG tablet Take 81 mg by mouth daily.      Marland Kitchen atorvastatin (LIPITOR) 40 MG tablet TAKE 1 TABLET BY MOUTH EVERY DAY 90 tablet 0  . benazepril (LOTENSIN) 20 MG tablet TAKE 1 TABLET BY MOUTH EVERY DAILY 90 tablet 0  . diphenhydrAMINE (BENADRYL) 25 mg capsule Take 25 mg by mouth every 6 (six)  hours as needed.    . gabapentin (NEURONTIN) 400 MG capsule One capsule twice daily and two at bedtime 120 capsule 4  . insulin NPH-regular Human (NOVOLIN 70/30) (70-30) 100 UNIT/ML injection 50 units with breakfast and 25 units with supper, and syringes 2/day. 20 mL 11  . Multiple Vitamins-Minerals (CENTRUM SILVER ULTRA WOMENS PO) Take 1 tablet by mouth daily.     Marland Kitchen omeprazole (PRILOSEC) 20 MG capsule TAKE 1 CAPSULE BY MOUTH DAILY. 90 capsule 0  . potassium chloride SA (K-DUR,KLOR-CON) 20 MEQ tablet TAKE 1 TABLET BY MOUTH TWICE DAILY 60 tablet 5  . sertraline (ZOLOFT) 100 MG tablet Take 1 tablet (100 mg total) by mouth 2 (two) times daily. 60 tablet 2  . torsemide (DEMADEX) 20 MG tablet TAKE 2 TABLETS BY MOUTH TWICE DAILY -STOP LASIX 120 tablet 6  . VOLTAREN 1 % GEL APPLY 4 GRAMS TOPICALLY 4 TIMES DAILY. 100 g 0   No current facility-administered medications for this visit.     Past Surgical History  Procedure Laterality Date  . Abdominal hysterectomy    . Breast excisional biopsy      Left; cyst  . Cholecystectomy    .  Cataract extraction w/ intraocular lens implant Left 09/07/2013  . Eye surgery Left 09/07/2013    cataract  . Colonoscopy    . Colonoscopy N/A 07/20/2015    Procedure: COLONOSCOPY;  Surgeon: Rogene Houston, MD;  Location[facility];  Service: Endoscopy;  Laterality: N/A;  930     Allergies  Allergen Reactions  . Penicillins Shortness Of Breath, Itching and Rash  . Prednisone Shortness Of Breath, Itching and Rash  . Propoxyphene N-Acetaminophen Itching and Nausea And Vomiting      Family History  Problem Relation Age of Onset  . ADD / ADHD Grandchild   . Bipolar disorder Grandchild   . Bipolar disorder Daughter   . Alcohol abuse Neg Hx   . Drug abuse Neg Hx   . Cancer Mother 22    lung   . Kidney disease Father   . Diabetes Sister      Social History Ms. Koelsch reports that she has never smoked. She has never used smokeless tobacco. Ms. Youngkin  reports that she does not drink alcohol.   Review of Systems CONSTITUTIONAL: No weight loss, fever, chills, weakness or fatigue.  HEENT: Eyes: No visual loss, blurred vision, double vision or yellow sclerae.No hearing loss, sneezing, congestion, runny nose or sore throat.  SKIN: No rash or itching.  CARDIOVASCULAR: per hpi RESPIRATORY: No shortness of breath, cough or sputum.  GASTROINTESTINAL: No anorexia, nausea, vomiting or diarrhea. No abdominal pain or blood.  GENITOURINARY: No burning on urination, no polyuria NEUROLOGICAL: No headache, dizziness, syncope, paralysis, ataxia, numbness or tingling in the extremities. No change in bowel or bladder control.  MUSCULOSKELETAL: No muscle, back pain, joint pain or stiffness.  LYMPHATICS: No enlarged nodes. No history of splenectomy.  PSYCHIATRIC: No history of depression or anxiety.  ENDOCRINOLOGIC: No reports of sweating, cold or heat intolerance. No polyuria or polydipsia.  Marland Kitchen   Physical Examination Filed Vitals:   08/23/15 0901  BP: 124/71  Pulse: 79   Filed Vitals:   08/23/15 0901  Height: 5\' 7"  (1.702 m)  Weight: 294 lb (133.358 kg)    Gen: resting comfortably, no acute distress HEENT: no scleral icterus, pupils equal round and reactive, no palptable cervical adenopathy,  CV: RRR, no m/r/g, nojvd Resp: Clear to auscultation bilaterally GI: abdomen is soft, non-tender, non-distended, normal bowel sounds, no hepatosplenomegaly MSK: extremities are warm, 2+ bilateral LE edema Skin: warm, no rash Neuro:  no focal deficits Psych: appropriate affect   Diagnostic Studies 01/2013 echo Study Conclusions  - Left ventricle: The cavity size was normal. There was mild concentric hypertrophy. Systolic function was normal. The estimated ejection fraction was in the range of 60% to 65%. Wall motion was normal; there were no regional wall motion abnormalities. - Aortic valve: Mildly calcified annulus. - Right ventricle:  The cavity size was normal. Wall thickness was mildly increased. - Atrial septum: No defect or patent foramen ovale was identified.  01/2013 MPI IMPRESSION: Probably negative pharmacologic stress nuclear myocardial study revealing normal left ventricular size, normal left ventricular systolic function and no significant stress-induced EKG abnormalities. On scintigraphic imaging, there was a small and mild defect that likely represents variable breast attenuation; however, the possibility of modest anterior ischemia cannot be unequivocally excluded.  01/2013 Event monitor No significant arrhythmias  12/2014 ABIs: R 1.10 L 1.15  02/2015 Carotid US 1-39% bilateral stenosis   Assessment and Plan  1. Chronic LE edema - echo 01/2013 with normal LVEF, diastolic function not reported but fairly normal  parameters - symptoms improved with change to torsemide, however recent weight gain and increased edema due to poor diuretic compliance. - encouraged to take her torsemide 40mg  bid  2. Leg pain - normal ABIs, defer further workup to pcp  3. HTN - at goal, continue current meds  4. Hyperlipidemia - at goal, continue current meds    F/u 6 weeks    Arnoldo Lenis, M.D.

## 2015-08-23 NOTE — Patient Instructions (Signed)
Your physician recommends that you schedule a follow-up appointment in: [place] [address] [place]  Your physician recommends that you continue on your current medications as directed. Please refer to the Current Medication list given to you today.  PLEASE CALL OUR OFFICE IN 1 WEEK WITH AN UPDATE ON WEIGHT AND SWELLING   Thank you for choosing [facility]!!

## 2015-08-30 ENCOUNTER — Telehealth: Payer: Self-pay | Admitting: Family Medicine

## 2015-08-30 NOTE — Telephone Encounter (Signed)
[facility]

## 2015-08-30 NOTE — Telephone Encounter (Signed)
Patients insurance will not pay for walker, cane nor pull ups at [facility], but send the orders to [facility] in [place], please advise?

## 2015-09-06 ENCOUNTER — Telehealth: Payer: Self-pay | Admitting: Family Medicine

## 2015-09-06 NOTE — Telephone Encounter (Signed)
Ms. Roorda is complaining of her feet and legs and she is worried due to the fact that she is diabetic, she is asking to speak to the nurse

## 2015-09-06 NOTE — Telephone Encounter (Signed)
Numbness likely due both to diabetes as well as arthritis in her spine. Needs to have someone examine her feet every day to ensure no cut that she may not feel.  Already on gabapentin for nerve pain  Sees neurology may have neurology furhter eval when she next goes Nothing else to add

## 2015-09-06 NOTE — Telephone Encounter (Signed)
Patient is complaining of numbness in feet.  States that it has worsened in the last 2 weeks.  Is to see podiatry in Jan.  Next appt with endo is in feb.  Please advise if there is any further advice for neuropathy.

## 2015-09-07 ENCOUNTER — Other Ambulatory Visit: Payer: Self-pay | Admitting: Family Medicine

## 2015-09-08 NOTE — Telephone Encounter (Signed)
Patient aware and will call neurology to schedule a follow up.  She was told at last visit that she did not need to return unless she started to have a new problem.

## 2015-09-16 ENCOUNTER — Other Ambulatory Visit: Payer: Self-pay | Admitting: Family Medicine

## 2015-09-30 ENCOUNTER — Other Ambulatory Visit (HOSPITAL_COMMUNITY): Payer: Self-pay | Admitting: Podiatry

## 2015-09-30 DIAGNOSIS — B351 Tinea unguium: Secondary | ICD-10-CM | POA: Diagnosis not present

## 2015-09-30 DIAGNOSIS — L851 Acquired keratosis [keratoderma] palmaris et plantaris: Secondary | ICD-10-CM | POA: Diagnosis not present

## 2015-09-30 DIAGNOSIS — I998 Other disorder of circulatory system: Secondary | ICD-10-CM

## 2015-09-30 DIAGNOSIS — E1342 Other specified diabetes mellitus with diabetic polyneuropathy: Secondary | ICD-10-CM | POA: Diagnosis not present

## 2015-10-03 ENCOUNTER — Ambulatory Visit (INDEPENDENT_AMBULATORY_CARE_PROVIDER_SITE_OTHER): Payer: PPO | Admitting: Cardiology

## 2015-10-03 ENCOUNTER — Encounter: Payer: Self-pay | Admitting: Cardiology

## 2015-10-03 ENCOUNTER — Ambulatory Visit: Payer: Self-pay | Admitting: Cardiology

## 2015-10-03 VITALS — BP 128/70 | HR 74 | Ht 67.0 in | Wt 291.0 lb

## 2015-10-03 DIAGNOSIS — R600 Localized edema: Secondary | ICD-10-CM | POA: Diagnosis not present

## 2015-10-03 NOTE — Patient Instructions (Signed)
Your physician recommends that you schedule a follow-up appointment in: 4 MONTHS WITH DR BRANCH  Your physician recommends that you continue on your current medications as directed. Please refer to the Current Medication list given to you today.  Your physician recommends that you return for lab work BMP/MG  Thank you for choosing [facility]!!    

## 2015-10-03 NOTE — Progress Notes (Signed)
Patient ID: Laura Atkins, female   DOB: [birth year], 75 y.o.   MRN: [id number]     Clinical Summary [name] is a 75 y.o.female seen today for follow up of the following medical problems. This is a focsued visit on recent leg swelling and weight gain.   1. Leg swelling - swelling has improved some since last visit. Weight down 3 lbs since our visit. Not checking weights at home - she is currently taking torsemide 40mg  in AM and 20mg  in PM, though dosing can vary based on degree of swelling.    Past Medical History  Diagnosis Date  . Hypertension   . Diabetes mellitus   . Hyperlipidemia   . Obesity   . Anemia   . Anxiety and depression   . GERD (gastroesophageal reflux disease)   . Pruritus   . Osteoarthritis     Left knee; right shoulder; chronic neck and back pain  . Obstructive sleep apnea   . Urinary incontinence   . Tremor     This started months ago after her seizure progressing to very poor hand writing  . Seizures (Laura Atkins)   . Depression   . Anxiety   . Headache(784.0)      Allergies  Allergen Reactions  . Penicillins Shortness Of Breath, Itching and Rash  . Prednisone Shortness Of Breath, Itching and Rash  . Propoxyphene N-Acetaminophen Itching and Nausea And Vomiting     Current Outpatient Prescriptions  Medication Sig Dispense Refill  . acetaminophen (EXTRA STRENGTH PAIN RELIEF) 500 MG tablet Take 500 mg by mouth every 6 (six) hours as needed for pain.    Marland Kitchen amLODipine (NORVASC) 10 MG tablet TAKE 1 TABLET BY MOUTH EVERY DAY 90 tablet 0  . aspirin 81 MG tablet Take 81 mg by mouth daily.      Marland Kitchen atorvastatin (LIPITOR) 40 MG tablet TAKE 1 TABLET BY MOUTH EVERY DAY 90 tablet 0  . benazepril (LOTENSIN) 20 MG tablet TAKE 1 TABLET BY MOUTH EVERY DAY 90 tablet 0  . diphenhydrAMINE (BENADRYL) 25 mg capsule Take 25 mg by mouth every 6 (six) hours as needed.    . gabapentin (NEURONTIN) 400 MG capsule TAKE 1 CAPSULE BY MOUTH TWICE DAILY AND TAKE 2 CAPSULES AT BEDTIME -  DOSE INCREASE SB 04/14/15 120 capsule 4  . insulin NPH-regular Human (NOVOLIN 70/30) (70-30) 100 UNIT/ML injection 50 units with breakfast and 25 units with supper, and syringes 2/day. 20 mL 11  . Multiple Vitamins-Minerals (CENTRUM SILVER ULTRA WOMENS PO) Take 1 tablet by mouth daily.     Marland Kitchen omeprazole (PRILOSEC) 20 MG capsule TAKE 1 CAPSULE BY MOUTH DAILY. 90 capsule 0  . potassium chloride SA (K-DUR,KLOR-CON) 20 MEQ tablet TAKE 1 TABLET BY MOUTH TWICE DAILY 60 tablet 5  . sertraline (ZOLOFT) 100 MG tablet Take 1 tablet (100 mg total) by mouth 2 (two) times daily. (Patient not taking: Reported on 08/23/2015) 60 tablet 2  . torsemide (DEMADEX) 20 MG tablet TAKE 2 TABLETS BY MOUTH TWICE DAILY -STOP LASIX 120 tablet 6  . VOLTAREN 1 % GEL APPLY 4 GRAMS TOPICALLY 4 TIMES DAILY. 100 g 1   No current facility-administered medications for this visit.     Past Surgical History  Procedure Laterality Date  . Abdominal hysterectomy    . Breast excisional biopsy      Left; cyst  . Cholecystectomy    . Cataract extraction w/ intraocular lens implant Left 09/07/2013  . Eye surgery Left 09/07/2013  cataract  . Colonoscopy    . Colonoscopy N/A 07/20/2015    Procedure: COLONOSCOPY;  Surgeon: Rogene Houston, MD;  Location: [facility];  Service: Endoscopy;  Laterality: N/A;  930     Allergies  Allergen Reactions  . Penicillins Shortness Of Breath, Itching and Rash  . Prednisone Shortness Of Breath, Itching and Rash  . Propoxyphene N-Acetaminophen Itching and Nausea And Vomiting      Family History  Problem Relation Age of Onset  . ADD / ADHD Grandchild   . Bipolar disorder Grandchild   . Bipolar disorder Daughter   . Alcohol abuse Neg Hx   . Drug abuse Neg Hx   . Cancer Mother 8    lung   . Kidney disease Father   . Diabetes Sister      Social History [name] reports that she has never smoked. She has never used smokeless tobacco. [name] reports that she does not drink  alcohol.   Review of Systems CONSTITUTIONAL: No weight loss, fever, chills, weakness or fatigue.  HEENT: Eyes: No visual loss, blurred vision, double vision or yellow sclerae.No hearing loss, sneezing, congestion, runny nose or sore throat.  SKIN: No rash or itching.  CARDIOVASCULAR: per HPI RESPIRATORY: No shortness of breath, cough or sputum.  GASTROINTESTINAL: No anorexia, nausea, vomiting or diarrhea. No abdominal pain or blood.  GENITOURINARY: No burning on urination, no polyuria NEUROLOGICAL: No headache, dizziness, syncope, paralysis, ataxia, numbness or tingling in the extremities. No change in bowel or bladder control.  MUSCULOSKELETAL: No muscle, back pain, joint pain or stiffness.  LYMPHATICS: No enlarged nodes. No history of splenectomy.  PSYCHIATRIC: No history of depression or anxiety.  ENDOCRINOLOGIC: No reports of sweating, cold or heat intolerance. No polyuria or polydipsia.  Marland Kitchen   Physical Examination Filed Vitals:   10/03/15 1315  BP: 128/70  Pulse: 74   Filed Vitals:   10/03/15 1315  Height: 5\' 7"  (1.702 m)  Weight: 291 lb (131.997 kg)    Gen: resting comfortably, no acute distress HEENT: no scleral icterus, pupils equal round and reactive, no palptable cervical adenopathy,  CV: RRR, no m/r/g, o jvd Resp: Clear to auscultation bilaterally GI: abdomen is soft, non-tender, non-distended, normal bowel sounds, no hepatosplenomegaly MSK: extremities are warm,1+ bilateral edema Skin: warm, no rash Neuro:  no focal deficits Psych: appropriate affect   Diagnostic Studies 01/2013 echo Study Conclusions  - Left ventricle: The cavity size was normal. There was mild concentric hypertrophy. Systolic function was normal. The estimated ejection fraction was in the range of 60% to 65%. Wall motion was normal; there were no regional wall motion abnormalities. - Aortic valve: Mildly calcified annulus. - Right ventricle: The cavity size was normal.  Wall thickness was mildly increased. - Atrial septum: No defect or patent foramen ovale was identified.  01/2013 MPI IMPRESSION: Probably negative pharmacologic stress nuclear myocardial study revealing normal left ventricular size, normal left ventricular systolic function and no significant stress-induced EKG abnormalities. On scintigraphic imaging, there was a small and mild defect that likely represents variable breast attenuation; however, the possibility of modest anterior ischemia cannot be unequivocally excluded.  01/2013 Event monitor No significant arrhythmias  12/2014 ABIs: R 1.10 L 1.15  02/2015 Carotid US 1-39% bilateral stenosis     Assessment and Plan   1. Chronic LE edema - echo 01/2013 with normal LVEF, diastolic function not reported but fairly normal parameters - symptoms improved with change to torsemide,she will continue current dosing.  - repeat  BMET and MG  F/u 4 months     Arnoldo Lenis, M.D.

## 2015-10-05 ENCOUNTER — Other Ambulatory Visit (HOSPITAL_COMMUNITY): Payer: Self-pay

## 2015-10-05 ENCOUNTER — Ambulatory Visit (HOSPITAL_COMMUNITY)
Admission: RE | Admit: 2015-10-05 | Discharge: 2015-10-05 | Disposition: A | Discharge status: Home / Self Care | Payer: PPO | Source: Ambulatory Visit | Attending: Podiatry | Admitting: Podiatry

## 2015-10-05 DIAGNOSIS — I998 Other disorder of circulatory system: Secondary | ICD-10-CM

## 2015-10-05 DIAGNOSIS — M79662 Pain in left lower leg: Secondary | ICD-10-CM | POA: Diagnosis not present

## 2015-10-05 DIAGNOSIS — M79661 Pain in right lower leg: Secondary | ICD-10-CM | POA: Diagnosis not present

## 2015-10-06 ENCOUNTER — Other Ambulatory Visit: Payer: Self-pay | Admitting: Podiatry

## 2015-10-06 DIAGNOSIS — I739 Peripheral vascular disease, unspecified: Secondary | ICD-10-CM

## 2015-10-11 ENCOUNTER — Encounter (HOSPITAL_COMMUNITY): Payer: Self-pay | Admitting: Psychiatry

## 2015-10-11 ENCOUNTER — Ambulatory Visit (INDEPENDENT_AMBULATORY_CARE_PROVIDER_SITE_OTHER): Payer: PPO | Admitting: Psychiatry

## 2015-10-11 VITALS — BP 138/89 | HR 81 | Ht 67.0 in | Wt 292.0 lb

## 2015-10-11 DIAGNOSIS — F329 Major depressive disorder, single episode, unspecified: Secondary | ICD-10-CM | POA: Diagnosis not present

## 2015-10-11 DIAGNOSIS — F32A Depression, unspecified: Secondary | ICD-10-CM

## 2015-10-11 MED ORDER — SERTRALINE HCL 100 MG PO TABS: 100.0000 mg | ORAL_TABLET | Freq: Two times a day (BID) | ORAL | Status: DC
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
## 2015-10-14 DIAGNOSIS — E1142 Type 2 diabetes mellitus with diabetic polyneuropathy: Secondary | ICD-10-CM | POA: Diagnosis not present

## 2015-10-14 DIAGNOSIS — I739 Peripheral vascular disease, unspecified: Secondary | ICD-10-CM | POA: Diagnosis not present

## 2015-10-17 ENCOUNTER — Other Ambulatory Visit: Payer: Self-pay | Admitting: Endocrinology

## 2015-10-18 ENCOUNTER — Ambulatory Visit
Admission: RE | Admit: 2015-10-18 | Discharge: 2015-10-18 | Disposition: A | Discharge status: Home / Self Care | Payer: PPO | Source: Ambulatory Visit | Attending: Podiatry | Admitting: Podiatry

## 2015-10-18 ENCOUNTER — Other Ambulatory Visit: Payer: Self-pay | Admitting: Cardiology

## 2015-10-18 DIAGNOSIS — I10 Essential (primary) hypertension: Secondary | ICD-10-CM | POA: Diagnosis not present

## 2015-10-18 DIAGNOSIS — I739 Peripheral vascular disease, unspecified: Secondary | ICD-10-CM

## 2015-10-18 DIAGNOSIS — R208 Other disturbances of skin sensation: Secondary | ICD-10-CM | POA: Diagnosis not present

## 2015-10-18 LAB — BASIC METABOLIC PANEL
BUN: 24 mg/dL (ref 7–25)
CO2: 32 mmol/L — ABNORMAL HIGH (ref 20–31)
Calcium: 9.2 mg/dL (ref 8.6–10.4)
Chloride: 100 mmol/L (ref 98–110)
Creat: 1.08 mg/dL — ABNORMAL HIGH (ref 0.60–0.93)
Glucose, Bld: 206 mg/dL — ABNORMAL HIGH (ref 65–99)
Potassium: 4.7 mmol/L (ref 3.5–5.3)
Sodium: 140 mmol/L (ref 135–146)

## 2015-10-18 LAB — MAGNESIUM: Magnesium: 2 mg/dL (ref 1.5–2.5)

## 2015-10-18 NOTE — Consult Note (Signed)
Chief Complaint: Patient was seen in consultation today for  Chief Complaint  Patient presents with  . Advice Only    Consult for PVD   at the request of Wilder  Referring Physician(s): McKinney,Benjamin  History of Present Illness: Laura Atkins is a 75 y.o. female with a history of diabetes mellitus and peripheral neuropathy who presents at the kind referral of Dr. Caprice Beaver for evaluation of lower extremity pain and swelling.  Laura Atkins is accompanied by her husband today. She is currently walking with a walker. As we discussed her symptoms, her primary issues revolve around decreased sensation in her feet, decreased proprioception with increasing difficulty telling precisely where her feet are, and the sense that the feet are larger than they used to be. She has had issues with significant lower extremity edema, but this is currently significantly improved since she saw her cardiologist and began taking torsemide in addition to Lasix. She notes now that the edema is no longer a limiting factor.  When asked specifically about lower extremity pain with walking/exercise she says that she occasionally and intermittently has lower extremity pain and that it seems more difficult to get up from a seated position to standing minute used to be but does not endorse classic symptoms of muscular pain with activity which is relieved by rest.  She denies history of prior wounds or ulceration of the feet. She denies chest pain, shortness of breath or other systemic symptoms at this time.  Past Medical History  Diagnosis Date  . Hypertension   . Diabetes mellitus   . Hyperlipidemia   . Obesity   . Anemia   . Anxiety and depression   . GERD (gastroesophageal reflux disease)   . Pruritus   . Osteoarthritis     Left knee; right shoulder; chronic neck and back pain  . Obstructive sleep apnea   . Urinary incontinence   . Tremor     This started months ago after her  seizure progressing to very poor hand writing  . Seizures ([place])   . Depression   . Anxiety   . [id number])     Past Surgical History  Procedure Laterality Date  . Abdominal hysterectomy    . Breast excisional biopsy      Left; cyst  . Cholecystectomy    . Cataract extraction w/ intraocular lens implant Left 09/07/2013  . Eye surgery Left 09/07/2013    cataract  . Colonoscopy    . Colonoscopy N/A 07/20/2015    Procedure: COLONOSCOPY;  Surgeon: Rogene Houston, MD;  Location: [facility];  Service: Endoscopy;  Laterality: N/A;  930    Allergies: Penicillins; Prednisone; and Propoxyphene n-acetaminophen  Medications: Prior to Admission medications   Medication Sig Start Date End Date Taking? Authorizing Provider  acetaminophen (EXTRA STRENGTH PAIN RELIEF) 500 MG tablet Take 500 mg by mouth every 6 (six) hours as needed for pain.   Yes Historical Provider, MD  amLODipine (NORVASC) 10 MG tablet TAKE 1 TABLET BY MOUTH EVERY DAY 09/20/15  Yes Fayrene Helper, MD  aspirin 81 MG tablet Take 81 mg by mouth daily.     Yes Historical Provider, MD  atorvastatin (LIPITOR) 40 MG tablet TAKE 1 TABLET BY MOUTH EVERY DAY 09/20/15  Yes Fayrene Helper, MD  benazepril (LOTENSIN) 20 MG tablet TAKE 1 TABLET BY MOUTH EVERY DAY 09/20/15  Yes Fayrene Helper, MD  gabapentin (NEURONTIN) 400 MG capsule TAKE 1 CAPSULE BY MOUTH TWICE DAILY AND  TAKE 2 CAPSULES AT BEDTIME - DOSE INCREASE SB 04/14/15 09/20/15  Yes Fayrene Helper, MD  insulin NPH-regular Human (NOVOLIN 70/30) (70-30) 100 UNIT/ML injection 50 units with breakfast and 25 units with supper, and syringes 2/day. 07/29/15  Yes Renato Shin, MD  Multiple Vitamins-Minerals (CENTRUM SILVER ULTRA WOMENS PO) Take 1 tablet by mouth daily.    Yes Historical Provider, MD  omeprazole (PRILOSEC) 20 MG capsule TAKE 1 CAPSULE BY MOUTH DAILY. 09/20/15  Yes Fayrene Helper, MD  ONE TOUCH ULTRA TEST test strip USE TO TEST BLOOD SUGAR FOUR TIMES  DAILY 10/17/15  Yes Renato Shin, MD  potassium chloride SA (K-DUR,KLOR-CON) 20 MEQ tablet TAKE 1 TABLET BY MOUTH TWICE DAILY 05/19/15  Yes Fayrene Helper, MD  sertraline (ZOLOFT) 100 MG tablet Take 1 tablet (100 mg total) by mouth 2 (two) times daily. 10/11/15 10/10/16 Yes Cloria Spring, MD  torsemide (DEMADEX) 20 MG tablet TAKE 2 TABLETS BY MOUTH TWICE DAILY -STOP LASIX 07/18/15  Yes Arnoldo Lenis, MD  VOLTAREN 1 % GEL APPLY 4 GRAMS TOPICALLY 4 TIMES DAILY. 09/07/15  Yes Fayrene Helper, MD  diphenhydrAMINE (BENADRYL) 25 mg capsule Take 25 mg by mouth every 6 (six) hours as needed. Reported on 10/18/2015    Historical Provider, MD     Family History  Problem Relation Age of Onset  . ADD / ADHD Grandchild   . Bipolar disorder Grandchild   . Bipolar disorder Daughter   . Alcohol abuse Neg Hx   . Drug abuse Neg Hx   . Cancer Mother 34    lung   . Kidney disease Father   . Diabetes Sister     Social History   Social History  . Marital Status: Married    Spouse Name: Laura Atkins  . Number of Children: 5  . Years of Education: 12   Occupational History  . retired     retired   Social History Main Topics  . Smoking status: Never Smoker   . Smokeless tobacco: Never Used  . Alcohol Use: No  . Drug Use: No  . Sexual Activity: Yes    Birth Control/ Protection: Surgical   Other Topics Concern  . Not on file   Social History Narrative   Patient lives at home with her husband Laura Atkins).  Patient is retired.    Right handed.    Five Children.    Caffeine- 2 daily   Review of Systems: A 12 point ROS discussed and pertinent positives are indicated in the HPI above.  All other systems are negative.  Review of Systems  Vital Signs: BP 131/53 mmHg  Pulse 80  Temp(Src) 98.1 F (36.7 C) (Oral)  Resp 14  Ht 5\' 7"  (1.702 m)  Wt 292 lb (132.45 kg)  BMI 45.72 kg/m2  SpO2 97%  Physical Exam  Constitutional: She is oriented to person, place, and time. She appears  well-developed and well-nourished. No distress.  HENT:  Head: Normocephalic and atraumatic.  Eyes: No scleral icterus.  Cardiovascular: Normal rate and regular rhythm.   Pulses:      Dorsalis pedis pulses are 2+ on the right side, and 2+ on the left side.       Posterior tibial pulses are 1+ on the right side, and 1+ on the left side.  Brisk capillary refill bilaterally  Pulmonary/Chest: Effort normal.  Abdominal: Soft. She exhibits no distension.  Musculoskeletal:       Right ankle: She exhibits swelling.  Left ankle: She exhibits swelling.  Feet:  Right Foot:  Skin Integrity: Negative for ulcer, blister, skin breakdown, erythema or warmth.  Left Foot:  Skin Integrity: Negative for ulcer, blister, skin breakdown, erythema or warmth.  Neurological: She is alert and oriented to person, place, and time. Coordination abnormal.  Skin: Skin is warm and dry.  Psychiatric: She has a normal mood and affect. Her behavior is normal.  Nursing note and vitals reviewed.    Imaging: US Arterial Seg Multiple  10/05/2015  CLINICAL DATA:  75 year old female with a history of bilateral calf pain. Cardiovascular risk factors include hypertension, hyperlipidemia, diabetes EXAM: NONINVASIVE PHYSIOLOGIC VASCULAR STUDY OF BILATERAL LOWER EXTREMITIES TECHNIQUE: Evaluation of both lower extremities was performed at rest, including calculation of ankle-brachial indices, multiple segmental pressure evaluation, segmental Doppler and segmental pulse volume recording. COMPARISON:  None. FINDINGS: Right: Resting ankle brachial index:  1.21 Segmental blood pressure: Relatively symmetric upper extremity pressures. Appropriate increase into the legs from the upper extremity. Greater than 15 mm Hg drop from the thigh to the calf, though could potentially be artifactual. Digital pressure is 123XX123 systolic Doppler: Segmental Doppler of the right lower extremity demonstrates biphasic waveform throughout. Pulse volume  recording: Segmental pulse volume recording of the right lower extremity demonstrates maintained waveform, with loss of augmentation from high thigh to below knee. Digital waveforms maintained. Left: Resting ankle brachial index: 1.26 Segmental blood pressure: Relatively symmetric upper extremity cuff pressures. Thigh pressure is not measured. Left digital pressure measures [id number] systolic. Doppler: Segmental Doppler of the left lower extremity demonstrates biphasic waveforms throughout. Pulse volume recording: Segmental pulse volume recording demonstrates loss of augmentation from high thigh to below knee. Waveforms maintained in the ankle, metatarsal, and digital segment. Toe waveforms maintained. Additional: IMPRESSION: Resting ankle-brachial index of the bilateral lower extremities demonstrates no evidence of arterial occlusive disease. Given the appearance of the remainder of the examination, the value could decrease after exercise. Evidence of develop iliac disease and right-sided femoral popliteal disease. If anatomic evaluation was warranted, CT angiogram with runoff could be considered. Signed, Dulcy Fanny. Earleen Newport, DO [facility] Electronically Signed   By: Corrie Mckusick D.O.   On: 10/05/2015 12:25    Labs:  CBC:  Recent Labs  12/13/14 1046  WBC 6.3  HGB 10.2*  HCT 32.5*  PLT 256    COAGS: No results for input(s): INR, APTT in the last 8760 hours.  BMP:  Recent Labs  12/13/14 1046 12/28/14 0711 03/14/15 1547 04/14/15 1225 08/04/15 1123  NA 141 140 140 139  --   K 4.2 4.6 4.6 4.7  --   CL 98 99 101 96  --   CO2 32 33* 32 30  --   GLUCOSE 93 209* 214* 262*  --   BUN 22 23 24* 31*  --   CALCIUM 9.9 9.4 9.4 9.7  --   CREATININE 0.91 1.00 1.33* 1.07 1.10*  GFRNONAA 64 57* 40* 52*  --   GFRAA 73 65 46* 60  --     LIVER FUNCTION TESTS:  Recent Labs  12/13/14 1046 12/28/14 0711 03/14/15 1547 04/14/15 1225  BILITOT  0.4 0.4 0.3 0.4  AST 18 19 25 17   ALT 21 22 25 22   ALKPHOS 116 117 128* 128*  PROT 7.2 7.1 7.4 7.4  ALBUMIN 4.2 4.1 4.0 3.8    TUMOR MARKERS: No results for input(s): AFPTM, CEA, CA199, CHROMGRNA in the last 8760 hours.  Assessment and Plan:  After careful clinical questioning and physical examination, I believe that the majority of Mrs. Petro clinical symptoms are related to slow progression of her diabetes related peripheral neuropathy.  Her ultrasound arterial study was minimally abnormal and on physical exam she has palpable and symmetric dorsalis pedis pulses bilaterally and brisk capillary refill.  I do not detect any clinically significant occlusive peripheral arterial disease.  Furthermore, her edema has significantly improved following the addition of the diuretic torsemide. She has no sequelae of significant chronic venous insufficiency.  I discussed these findings with her and her husband. They seem happy to hear that she has no significant arterial or venous problems at this time.  - Continue excellent diabetic foot care  - If she begins to develop neuropathic pain, consideration could be given to a trial of Neurontin, Lyrica or similar. Currently, she has no pain.  - At this time I will schedule no planned follow-up evaluations. However, if she develops recurrent progressive edema I would be happy to see her again to do a more formal venous ultrasound to evaluate for underlying venous insufficiency.  Given the improvement in her symptoms currently I do not think this is necessary at this time.   Thank you for this interesting consult.  I greatly enjoyed meeting Laura Atkins and look forward to participating in their care.  A copy of this report was sent to the requesting provider on this date.  Electronically Signed: Jacqulynn Cadet 10/18/2015, 12:48 PM   I spent a total of  40 Minutes in face to face in clinical consultation, greater than 50% of which was  counseling/coordinating care for lower extremity pain and swelling.

## 2015-10-21 ENCOUNTER — Telehealth: Payer: Self-pay | Admitting: *Deleted

## 2015-10-21 NOTE — Telephone Encounter (Signed)
Pt aware, routed to pcp 

## 2015-10-21 NOTE — Telephone Encounter (Signed)
-----   Message from Arnoldo Lenis, MD sent at 10/20/2015 11:24 AM EST ----- Labs look good  Zandra Abts MD

## 2015-11-04 ENCOUNTER — Ambulatory Visit (INDEPENDENT_AMBULATORY_CARE_PROVIDER_SITE_OTHER): Payer: PPO | Admitting: Endocrinology

## 2015-11-04 ENCOUNTER — Encounter: Payer: Self-pay | Admitting: Endocrinology

## 2015-11-04 VITALS — BP 170/70 | HR 103 | Temp 97.8°F | Ht 67.0 in | Wt 297.0 lb

## 2015-11-04 DIAGNOSIS — IMO0001 Reserved for inherently not codable concepts without codable children: Secondary | ICD-10-CM

## 2015-11-04 DIAGNOSIS — E109 Type 1 diabetes mellitus without complications: Secondary | ICD-10-CM

## 2015-11-04 DIAGNOSIS — E1065 Type 1 diabetes mellitus with hyperglycemia: Principal | ICD-10-CM

## 2015-11-04 LAB — POCT GLYCOSYLATED HEMOGLOBIN (HGB A1C): Hemoglobin A1C: 8.2

## 2015-11-04 NOTE — Progress Notes (Signed)
Subjective:    Patient ID: Laura Atkins, female    DOB: [birth year], 75 y.o.   MRN: [id number]  HPI Pt returns for f/u of diabetes mellitus: DM type: insulin-requiring type 2.   Dx'ed: [id number] Complications: polyneuropathy, PAD, and foot ulcer.   Therapy: insulin since 2005.   GDM: never DKA: never Severe hypoglycemia: never. Pancreatitis: never.   Other info: in early 2016, she changed to BID premixed insulin, due to poor results with multiple daily injections.  She takes human insulin, due to cost.  Interval history: she brings a record of her cbg's which i have reviewed today.  It varies from 61-333.  It is lowest at lunch, but not necessarily so.  She forgets PM insulin approx twice a week.   Past Medical History  Diagnosis Date  . Hypertension   . Diabetes mellitus   . Hyperlipidemia   . Obesity   . Anemia   . Anxiety and depression   . GERD (gastroesophageal reflux disease)   . Pruritus   . Osteoarthritis     Left knee; right shoulder; chronic neck and back pain  . Obstructive sleep apnea   . Urinary incontinence   . Tremor     This started months ago after her seizure progressing to very poor hand writing  . Seizures ([place])   . Depression   . Anxiety   . [id number])     Past Surgical History  Procedure Laterality Date  . Abdominal hysterectomy    . Breast excisional biopsy      Left; cyst  . Cholecystectomy    . Cataract extraction w/ intraocular lens implant Left 09/07/2013  . Eye surgery Left 09/07/2013    cataract  . Colonoscopy    . Colonoscopy N/A 07/20/2015    Procedure: COLONOSCOPY;  Surgeon: Rogene Houston, MD;  Location: [facility];  Service: Endoscopy;  Laterality: N/A;  930    Social History   Social History  . Marital Status: Married    Spouse Name: saunders  . Number of Children: 5  . Years of Education: 12   Occupational History  . retired     retired   Social History Main Topics  . Smoking status: Never Smoker   .  Smokeless tobacco: Never Used  . Alcohol Use: No  . Drug Use: No  . Sexual Activity: Yes    Birth Control/ Protection: Surgical   Other Topics Concern  . Not on file   Social History Narrative   Patient lives at home with her husband [name].  Patient is retired.    Right handed.    Five Children.    Caffeine- 2 daily    Current Outpatient Prescriptions on File Prior to Visit  Medication Sig Dispense Refill  . acetaminophen (EXTRA STRENGTH PAIN RELIEF) 500 MG tablet Take 500 mg by mouth every 6 (six) hours as needed for pain.    Marland Kitchen amLODipine (NORVASC) 10 MG tablet TAKE 1 TABLET BY MOUTH EVERY DAY 90 tablet 0  . aspirin 81 MG tablet Take 81 mg by mouth daily.      Marland Kitchen atorvastatin (LIPITOR) 40 MG tablet TAKE 1 TABLET BY MOUTH EVERY DAY 90 tablet 0  . benazepril (LOTENSIN) 20 MG tablet TAKE 1 TABLET BY MOUTH EVERY DAY 90 tablet 0  . diphenhydrAMINE (BENADRYL) 25 mg capsule Take 25 mg by mouth every 6 (six) hours as needed. Reported on 10/18/2015    . gabapentin (NEURONTIN) 400 MG capsule TAKE 1 CAPSULE  BY MOUTH TWICE DAILY AND TAKE 2 CAPSULES AT BEDTIME - DOSE INCREASE SB 04/14/15 120 capsule 4  . insulin NPH-regular Human (NOVOLIN 70/30) (70-30) 100 UNIT/ML injection 50 units with breakfast and 25 units with supper, and syringes 2/day. 20 mL 11  . Multiple Vitamins-Minerals (CENTRUM SILVER ULTRA WOMENS PO) Take 1 tablet by mouth daily.     Marland Kitchen omeprazole (PRILOSEC) 20 MG capsule TAKE 1 CAPSULE BY MOUTH DAILY. 90 capsule 0  . ONE TOUCH ULTRA TEST test strip USE TO TEST BLOOD SUGAR FOUR TIMES DAILY 150 each 2  . potassium chloride SA (K-DUR,KLOR-CON) 20 MEQ tablet TAKE 1 TABLET BY MOUTH TWICE DAILY 60 tablet 5  . sertraline (ZOLOFT) 100 MG tablet Take 1 tablet (100 mg total) by mouth 2 (two) times daily. 60 tablet 2  . torsemide (DEMADEX) 20 MG tablet TAKE 2 TABLETS BY MOUTH TWICE DAILY -STOP LASIX 120 tablet 6  . VOLTAREN 1 % GEL APPLY 4 GRAMS TOPICALLY 4 TIMES DAILY. 100 g 1   No current  facility-administered medications on file prior to visit.    Allergies  Allergen Reactions  . Penicillins Shortness Of Breath, Itching and Rash  . Prednisone Shortness Of Breath, Itching and Rash  . Propoxyphene N-Acetaminophen Itching and Nausea And Vomiting    Family History  Problem Relation Age of Onset  . ADD / ADHD Grandchild   . Bipolar disorder Grandchild   . Bipolar disorder Daughter   . Alcohol abuse Neg Hx   . Drug abuse Neg Hx   . Cancer Mother 32    lung   . Kidney disease Father   . Diabetes Sister     BP 170/70 mmHg  Pulse 103  Temp(Src) 97.8 F (36.6 C) (Oral)  Ht 5\' 7"  (1.702 m)  Wt 297 lb (134.718 kg)  BMI 46.51 kg/m2  SpO2 94%  Review of Systems Denies LOC.  She has chronic leg weakness    Objective:   Physical Exam VITAL SIGNS:  See vs page GENERAL: no distress SKIN:  Insulin injection sites at the anterior abdomen are normal Gait: steady with a walker   A1c=8.2%    Assessment & Plan:  DM: she needs increased rx Noncompliance with taking PM insulin. Leg weakness, uncertain etiology.  Patient is advised the following: Patient Instructions  check your blood sugar 4 times a day: before the 3 meals, and at bedtime.  also check if you have symptoms of your blood sugar being too high or too low.  please keep a record of the readings and bring it to your next appointment here.  You can write it on any piece of paper.  please call us sooner if your blood sugar goes below 70, or if you have a lot of readings over 200.  Please continue the insulin, 50 units with breakfast (40 on sundays), and 25 units with supper.  For your safety, take this insulin right before you eat, and only when you eat the first and last meals of the day.  On this type of insulin schedule, you should eat meals on a regular schedule.  If a meal is missed or significantly delayed, your blood sugar could go low.   To help you remember the evening insulin, draw it up when you draw  up your breakfast insulin.  It can stay at room temperature. Please come back for a follow-up appointment in 3 months.   Let me know if you want to have the nerve endings of the  legs tested

## 2015-11-04 NOTE — Patient Instructions (Addendum)
check your blood sugar 4 times a day: before the 3 meals, and at bedtime.  also check if you have symptoms of your blood sugar being too high or too low.  please keep a record of the readings and bring it to your next appointment here.  You can write it on any piece of paper.  please call us sooner if your blood sugar goes below 70, or if you have a lot of readings over 200.  Please continue the insulin, 50 units with breakfast (40 on sundays), and 25 units with supper.  For your safety, take this insulin right before you eat, and only when you eat the first and last meals of the day.  On this type of insulin schedule, you should eat meals on a regular schedule.  If a meal is missed or significantly delayed, your blood sugar could go low.   To help you remember the evening insulin, draw it up when you draw up your breakfast insulin.  It can stay at room temperature. Please come back for a follow-up appointment in 3 months.   Let me know if you want to have the nerve endings of the legs tested

## 2015-11-15 ENCOUNTER — Other Ambulatory Visit: Payer: Self-pay | Admitting: Family Medicine

## 2015-11-15 ENCOUNTER — Ambulatory Visit (INDEPENDENT_AMBULATORY_CARE_PROVIDER_SITE_OTHER): Payer: PPO

## 2015-11-15 DIAGNOSIS — N3001 Acute cystitis with hematuria: Secondary | ICD-10-CM | POA: Diagnosis not present

## 2015-11-15 LAB — POCT URINALYSIS DIPSTICK
Bilirubin, UA: NEGATIVE
Glucose, UA: NEGATIVE
Ketones, UA: NEGATIVE
Nitrite, UA: NEGATIVE
Protein, UA: NEGATIVE
Spec Grav, UA: 1.015
Urobilinogen, UA: 0.2
pH, UA: 5.5

## 2015-11-15 MED ORDER — CIPROFLOXACIN HCL 500 MG PO TABS: 500.0000 mg | ORAL_TABLET | Freq: Two times a day (BID) | ORAL | Status: DC

## 2015-11-15 NOTE — Progress Notes (Signed)
Patient complaining of burning with urination, frequency, urine odor x 5 days.  In for nurse visit.  Urine also sent for culture.   Cipro 500mg  BID x 3 days sent to [facility].   Patient aware.

## 2015-11-19 LAB — URINE CULTURE: Colony Count: >=100000

## 2015-11-22 ENCOUNTER — Ambulatory Visit (INDEPENDENT_AMBULATORY_CARE_PROVIDER_SITE_OTHER): Payer: Self-pay | Admitting: Internal Medicine

## 2015-11-23 ENCOUNTER — Encounter (INDEPENDENT_AMBULATORY_CARE_PROVIDER_SITE_OTHER): Payer: Self-pay | Admitting: Internal Medicine

## 2015-12-12 ENCOUNTER — Ambulatory Visit (INDEPENDENT_AMBULATORY_CARE_PROVIDER_SITE_OTHER): Payer: PPO | Admitting: Family Medicine

## 2015-12-12 ENCOUNTER — Encounter: Payer: Self-pay | Admitting: Family Medicine

## 2015-12-12 VITALS — BP 146/66 | HR 84 | Resp 18 | Ht 67.0 in | Wt 298.0 lb

## 2015-12-12 DIAGNOSIS — F419 Anxiety disorder, unspecified: Secondary | ICD-10-CM

## 2015-12-12 DIAGNOSIS — F32A Depression, unspecified: Secondary | ICD-10-CM

## 2015-12-12 DIAGNOSIS — E1065 Type 1 diabetes mellitus with hyperglycemia: Secondary | ICD-10-CM | POA: Diagnosis not present

## 2015-12-12 DIAGNOSIS — F418 Other specified anxiety disorders: Secondary | ICD-10-CM

## 2015-12-12 DIAGNOSIS — F329 Major depressive disorder, single episode, unspecified: Secondary | ICD-10-CM

## 2015-12-12 DIAGNOSIS — I10 Essential (primary) hypertension: Secondary | ICD-10-CM

## 2015-12-12 DIAGNOSIS — IMO0002 Reserved for concepts with insufficient information to code with codable children: Secondary | ICD-10-CM

## 2015-12-12 DIAGNOSIS — F514 Sleep terrors [night terrors]: Secondary | ICD-10-CM

## 2015-12-12 DIAGNOSIS — R32 Unspecified urinary incontinence: Secondary | ICD-10-CM

## 2015-12-12 DIAGNOSIS — E108 Type 1 diabetes mellitus with unspecified complications: Secondary | ICD-10-CM

## 2015-12-12 DIAGNOSIS — G4733 Obstructive sleep apnea (adult) (pediatric): Secondary | ICD-10-CM

## 2015-12-12 DIAGNOSIS — R159 Full incontinence of feces: Secondary | ICD-10-CM | POA: Diagnosis not present

## 2015-12-12 DIAGNOSIS — R269 Unspecified abnormalities of gait and mobility: Secondary | ICD-10-CM

## 2015-12-12 MED ORDER — KETOROLAC TROMETHAMINE 60 MG/2ML IM SOLN
60.0000 mg | Freq: Once | INTRAMUSCULAR | Status: AC
  Administered 2015-12-12: 60 mg via INTRAMUSCULAR

## 2015-12-12 NOTE — Patient Instructions (Addendum)
F/u in 10 weeks,  Call if you need me sooner  You are referred for physical therapy to strengthen legs and improve your ability to get around  You are referred referred to Dr [name] to discuss incontinence and also because he needs to see you in follow up from your colonoscopy  You are referred to your neurologist to discuss sleep terror, excessive movement in sleep and also groaning in sleep and for your regular follow up  You are referred to Dr Rodena Piety for your eye exam, which is past due  Thanks for choosing [facility], we consider it a privelige to serve you.

## 2015-12-12 NOTE — Progress Notes (Signed)
Subjective:    Patient ID: Laura Atkins, female    DOB: Aug 02, 1941, 75 y.o.   MRN: [id number]  HPI   Laura Atkins     MRN: [id number]      DOB: 12-12-1940   HPI Laura Atkins is here for follow up and re-evaluation of chronic medical conditions, medication management and review of any available recent lab and radiology data.  Preventive health is updated, specifically  Cancer screening and Immunization.   Questions or concerns regarding consultations or procedures which the PT has had in the interim are  addressed. The PT denies any adverse reactions to current medications since the last visit.  Several new concerns as below, also increased night terrors with excessive movement and groaning in her sleep, spouse reports that he is awake all night watching her  ROS Denies recent fever or chills. Denies sinus pressure, nasal congestion, ear pain or sore throat. Denies chest congestion, productive cough or wheezing. Denies chest pains, palpitations and leg swelling Denies abdominal pain, nausea, vomiting,diarrhea or constipation.  New c/o of fecal incontinence, which is increasingly challenging her ability to leave her home  Denies dysuria, frequency, hesitancy, does have chronic  incontinence. C/o chronic  joint pain, swelling and limitation in mobility.Concerned as becoming increasingly stiff and less mobile, needs assistance in rising, will go to PT Denies headaches, seizures Denies uncontrolled depression, anxiety or insomnia. Denies skin break down or rash.   PE  BP 146/66 mmHg  Pulse 84  Resp 18  Ht 5\' 7"  (1.702 m)  Wt 298 lb (135.172 kg)  BMI 46.66 kg/m2  SpO2 95%  Patient alert and oriented and in no cardiopulmonary distress.  HEENT: No facial asymmetry, EOMI,   oropharynx pink and moist.  Neck decreased ROM no JVD, no mass.  Chest: Clear to auscultation bilaterally.  CVS: S1, S2 no murmurs, no S3.Regular rate.  ABD: Soft non tender.   Ext: No edema  MS:  decreased  ROM spine, shoulders, hips and knees.  Skin: Intact, no ulcerations or rash noted.  Psych: Good eye contact, normal affect. Memory intact not anxious or depressed appearing.  CNS: CN 2-12 intact, power,  normal throughout.no focal deficits noted.   Assessment & Plan   Fecal incontinence Dr Laura Atkins to evaluate and treat pt with fecal incontinence, pt states started after recent colonoscopy, also needs to return to Doc for follow up from her colonoscopy, she missed her scheduled follow up appt in Feb  Sleep terror disorder Progressively worsening sleep terror, abnormal movements, groaning, neurology to asses  UNSTEADY GAIT Increased stiffness and reduced mobility noted in past 3 to 4 months, lying in bed much more and requiring increased asistance to raise up from chair, will benefit from PT and agrees to go  Essential hypertension Elevated systolic blood pressure, dietary change and weight loss, no change in medication Review at next visit   OSA (obstructive sleep apnea) Non compliant, states "can't breathe" feels suffocated, is being referred to neurology for sleep terror, hopefully alternate treatment for sleep apnea can be started  Diabetes mellitus, insulin dependent (IDDM), uncontrolled Improved, managed by endo, eye exam past due , she is referred Laura Atkins is reminded of the importance of commitment to daily physical activity for 30 minutes or more, as able and the need to limit carbohydrate intake to 30 to 60 grams per meal to help with blood sugar control.   The need to take medication as prescribed, test blood sugar as directed, and to  call between visits if there is a concern that blood sugar is uncontrolled is also discussed.   Laura Atkins is reminded of the importance of daily foot exam, annual eye examination, and good blood sugar, blood pressure and cholesterol control.  Diabetic Labs Latest Ref Rng 11/04/2015 10/18/2015 08/04/2015 07/29/2015 05/23/2015  HbA1c -  8.2 - - 8.6 9.8  Microalbumin <2.0 mg/dL - - - - -  Micro/Creat Ratio 0.0 - 30.0 mg/g - - - - -  Chol 0 - 200 mg/dL - - - - -  HDL >=46 mg/dL - - - - -  Calc LDL 0 - 99 mg/dL - - - - -  Triglycerides <150 mg/dL - - - - -  Creatinine 0.60 - 0.93 mg/dL - 1.08(H) 1.10(H) - -   BP/Weight 12/12/2015 11/04/2015 10/18/2015 10/03/2015 08/23/2015 08/10/2015 0000000  Systolic BP 123456 [id number] 0000000 [id number]  Diastolic BP 66 70 53 70 71 64 66  Wt. (Lbs) 298 297 292 291 294 294.4 293  BMI 46.66 46.51 45.72 45.57 46.04 46.1 45.88  Some encounter information is confidential and restricted. Go to Review Flowsheets activity to see all data.   Foot/eye exam completion dates 07/29/2015 05/23/2015  Foot Form Completion Done Done         Anxiety and depression Controlled and managed by psychiatry  Morbid obesity Deteriorated. Patient re-educated about  the importance of commitment to a  minimum of 150 minutes of exercise per week.  The importance of healthy food choices with portion control discussed. Encouraged to start a food diary, count calories and to consider  joining a support group. Sample diet sheets offered. Goals set by the patient for the next several months.   Weight /BMI 12/12/2015 11/04/2015 10/18/2015  WEIGHT 298 lb 297 lb 292 lb  HEIGHT 5\' 7"  5\' 7"  5\' 7"   BMI 46.66 kg/m2 46.51 kg/m2 45.72 kg/m2           Review of Systems     Objective:   Physical Exam        Assessment & Plan:

## 2015-12-13 DIAGNOSIS — R159 Full incontinence of feces: Secondary | ICD-10-CM | POA: Insufficient documentation

## 2015-12-13 NOTE — Assessment & Plan Note (Addendum)
Dr [name] to evaluate and treat pt with fecal incontinence, pt states started after recent colonoscopy, also needs to return to Doc for follow up from her colonoscopy, she missed her scheduled follow up appt in Feb

## 2015-12-14 ENCOUNTER — Encounter (INDEPENDENT_AMBULATORY_CARE_PROVIDER_SITE_OTHER): Payer: Self-pay | Admitting: *Deleted

## 2015-12-14 DIAGNOSIS — F514 Sleep terrors [night terrors]: Secondary | ICD-10-CM | POA: Insufficient documentation

## 2015-12-14 NOTE — Assessment & Plan Note (Signed)
Deteriorated. Patient re-educated about  the importance of commitment to a  minimum of 150 minutes of exercise per week.  The importance of healthy food choices with portion control discussed. Encouraged to start a food diary, count calories and to consider  joining a support group. Sample diet sheets offered. Goals set by the patient for the next several months.   Weight /BMI 12/12/2015 11/04/2015 10/18/2015  WEIGHT 298 lb 297 lb 292 lb  HEIGHT 5\' 7"  5\' 7"  5\' 7"   BMI 46.66 kg/m2 46.51 kg/m2 45.72 kg/m2

## 2015-12-14 NOTE — Assessment & Plan Note (Signed)
Controlled and managed by psychiatry 

## 2015-12-14 NOTE — Assessment & Plan Note (Addendum)
Improved, managed by endo, eye exam past due , she is referred Laura Atkins is reminded of the importance of commitment to daily physical activity for 30 minutes or more, as able and the need to limit carbohydrate intake to 30 to 60 grams per meal to help with blood sugar control.   The need to take medication as prescribed, test blood sugar as directed, and to call between visits if there is a concern that blood sugar is uncontrolled is also discussed.   Laura Atkins is reminded of the importance of daily foot exam, annual eye examination, and good blood sugar, blood pressure and cholesterol control.  Diabetic Labs Latest Ref Rng 11/04/2015 10/18/2015 08/04/2015 07/29/2015 05/23/2015  HbA1c - 8.2 - - 8.6 9.8  Microalbumin <2.0 mg/dL - - - - -  Micro/Creat Ratio 0.0 - 30.0 mg/g - - - - -  Chol 0 - 200 mg/dL - - - - -  HDL >=46 mg/dL - - - - -  Calc LDL 0 - 99 mg/dL - - - - -  Triglycerides <150 mg/dL - - - - -  Creatinine 0.60 - 0.93 mg/dL - 1.08(H) 1.10(H) - -   BP/Weight 12/12/2015 11/04/2015 10/18/2015 10/03/2015 08/23/2015 08/10/2015 0000000  Systolic BP 123456 [id number] A999333 0000000 [id number]  Diastolic BP 66 70 53 70 71 64 66  Wt. (Lbs) 298 297 292 291 294 294.4 293  BMI 46.66 46.51 45.72 45.57 46.04 46.1 45.88  Some encounter information is confidential and restricted. Go to Review Flowsheets activity to see all data.   Foot/eye exam completion dates 07/29/2015 05/23/2015  Foot Form Completion Done Done

## 2015-12-14 NOTE — Assessment & Plan Note (Signed)
Increased stiffness and reduced mobility noted in past 3 to 4 months, lying in bed much more and requiring increased asistance to raise up from chair, will benefit from PT and agrees to go

## 2015-12-14 NOTE — Assessment & Plan Note (Signed)
Progressively worsening sleep terror, abnormal movements, groaning, neurology to asses

## 2015-12-14 NOTE — Assessment & Plan Note (Signed)
Elevated systolic blood pressure, dietary change and weight loss, no change in medication Review at next visit

## 2015-12-14 NOTE — Assessment & Plan Note (Signed)
Non compliant, states "can't breathe" feels suffocated, is being referred to neurology for sleep terror, hopefully alternate treatment for sleep apnea can be started

## 2015-12-15 ENCOUNTER — Other Ambulatory Visit: Payer: Self-pay | Admitting: Family Medicine

## 2015-12-16 DIAGNOSIS — B351 Tinea unguium: Secondary | ICD-10-CM | POA: Diagnosis not present

## 2015-12-16 DIAGNOSIS — L851 Acquired keratosis [keratoderma] palmaris et plantaris: Secondary | ICD-10-CM | POA: Diagnosis not present

## 2015-12-16 DIAGNOSIS — E1342 Other specified diabetes mellitus with diabetic polyneuropathy: Secondary | ICD-10-CM | POA: Diagnosis not present

## 2015-12-22 ENCOUNTER — Telehealth: Payer: Self-pay

## 2015-12-22 ENCOUNTER — Institutional Professional Consult (permissible substitution): Payer: PPO | Admitting: Neurology

## 2015-12-22 NOTE — Telephone Encounter (Signed)
No showed new sleep consult appt.

## 2015-12-27 ENCOUNTER — Telehealth: Payer: Self-pay | Admitting: Family Medicine

## 2015-12-27 NOTE — Telephone Encounter (Signed)
Patient states that she is in pain and she wants to speak to the nurse, please advise?

## 2015-12-27 NOTE — Telephone Encounter (Signed)
Patient will go to GI appt before she schedules here

## 2015-12-28 ENCOUNTER — Ambulatory Visit (INDEPENDENT_AMBULATORY_CARE_PROVIDER_SITE_OTHER): Payer: PPO | Admitting: Internal Medicine

## 2015-12-28 ENCOUNTER — Encounter (INDEPENDENT_AMBULATORY_CARE_PROVIDER_SITE_OTHER): Payer: Self-pay | Admitting: Internal Medicine

## 2015-12-28 VITALS — BP 150/60 | HR 80 | Temp 97.8°F | Ht 68.0 in | Wt 295.1 lb

## 2015-12-28 DIAGNOSIS — R159 Full incontinence of feces: Secondary | ICD-10-CM

## 2015-12-28 NOTE — Patient Instructions (Addendum)
Increase fiber in diet.  OV in 1 year.  Fiber 4 gms daily.

## 2015-12-28 NOTE — Progress Notes (Signed)
Subjective:    Patient ID: Laura Atkins, female    DOB: 04-09-1941, 75 y.o.   MRN: [id number]  HPI Here today for f/u. She tells me she has some incontinence if she pushes to hard getting up.  She usually has a BM daily. Stools are usually loose and mushy and sometimes they are firm. No melena or BRRB.  Her appetite is good. No weight loss. She walks with a cane. She underwent a colonoscopy in October of lat year ( see below). Follow up CT did not suggest metastatic disease.      08/04/2015 CT abdomen/pelvis with CM:  Recent colonoscopy. Suspicious lesion next to ileocecal vlave suspicious for carcinoma.       IMPRESSION: 1. No specific findings identified to suggest metastatic disease within the abdomen or pelvis. 2. There is a small low attenuation structure within segment 6 of the liver. This is too small to characterize and appears new from 03/30/2011. In a patient that is at increased risk for liver metastasis consider further assessment with liver protocol MRI. 3. Borderline enlarged left common iliac lymph node is unchanged from 2012 and likely benign. 4. Aortic atherosclerosis.    07/20/2015 Colonoscopy  Indications: Patient is 75 year old African-American female who is here for average risk screening colonoscopy. Her. Her last exam was 12 years ago. She has irregular bowel movements with diarrhea urgency and then she is constipated. Her symptoms are felt to be typical of IBS.  Impression:  Examination performed to cecum. 25 mm flat ulcerated lesion next ileocecal valve suspicious for carcinoma. Multiple biopsies taken. Small external hemorrhoids.  Biopsy results reviewed with patient. Patient states she is on low-dose aspirin but does not take other OTC NSAIDs. Biopsy shows benign ulcer and not a neoplastic process. Patient advised to take Imodium OTC 2 mg every morning for diarrhea. Will review endoscopic pictures again before considering further  evaluation. Report to PCP.   Review of Systems Past Medical History  Diagnosis Date  . Hypertension   . Diabetes mellitus   . Hyperlipidemia   . Obesity   . Anemia   . Anxiety and depression   . GERD (gastroesophageal reflux disease)   . Pruritus   . Osteoarthritis     Left knee; right shoulder; chronic neck and back pain  . Obstructive sleep apnea   . Urinary incontinence   . Tremor     This started months ago after her seizure progressing to very poor hand writing  . Seizures ([place])   . Depression   . Anxiety   . [id number])     Past Surgical History  Procedure Laterality Date  . Abdominal hysterectomy    . Breast excisional biopsy      Left; cyst  . Cholecystectomy    . Cataract extraction w/ intraocular lens implant Left 09/07/2013  . Eye surgery Left 09/07/2013    cataract  . Colonoscopy    . Colonoscopy N/A 07/20/2015    Procedure: COLONOSCOPY;  Surgeon: Rogene Houston, MD;  Location: [facility];  Service: Endoscopy;  Laterality: N/A;  930    Allergies  Allergen Reactions  . Penicillins Shortness Of Breath, Itching and Rash  . Prednisone Shortness Of Breath, Itching and Rash  . Propoxyphene N-Acetaminophen Itching and Nausea And Vomiting    Current Outpatient Prescriptions on File Prior to Visit  Medication Sig Dispense Refill  . acetaminophen (EXTRA STRENGTH PAIN RELIEF) 500 MG tablet Take 500 mg by mouth every 6 (six) hours as needed  for pain.    Marland Kitchen amLODipine (NORVASC) 10 MG tablet TAKE 1 TABLET BY MOUTH EVERY DAY 90 tablet 0  . aspirin 81 MG tablet Take 81 mg by mouth daily.      Marland Kitchen atorvastatin (LIPITOR) 40 MG tablet TAKE 1 TABLET BY MOUTH EVERY DAY 90 tablet 0  . benazepril (LOTENSIN) 20 MG tablet TAKE 1 TABLET BY MOUTH EVERY DAY 90 tablet 0  . diclofenac sodium (VOLTAREN) 1 % GEL APPLY 4 GRAMS TOPICALLY TO AFFECTED AREA(S) FOUR TIMES DAILY 100 g 1  . diphenhydrAMINE (BENADRYL) 25 mg capsule Take 25 mg by mouth every 6 (six) hours as needed.  Reported on 10/18/2015    . gabapentin (NEURONTIN) 400 MG capsule TAKE 1 CAPSULE BY MOUTH TWICE DAILY AND TAKE 2 CAPSULES AT BEDTIME - DOSE INCREASE SB 04/14/15 120 capsule 4  . insulin NPH-regular Human (NOVOLIN 70/30) (70-30) 100 UNIT/ML injection 50 units with breakfast and 25 units with supper, and syringes 2/day. 20 mL 11  . Multiple Vitamins-Minerals (CENTRUM SILVER ULTRA WOMENS PO) Take 1 tablet by mouth daily.     Marland Kitchen omeprazole (PRILOSEC) 20 MG capsule TAKE 1 CAPSULE BY MOUTH DAILY. 90 capsule 0  . ONE TOUCH ULTRA TEST test strip USE TO TEST BLOOD SUGAR FOUR TIMES DAILY 150 each 2  . potassium chloride SA (K-DUR,KLOR-CON) 20 MEQ tablet TAKE 1 TABLET BY MOUTH TWICE DAILY 60 tablet 5  . sertraline (ZOLOFT) 100 MG tablet Take 1 tablet (100 mg total) by mouth 2 (two) times daily. 60 tablet 2  . torsemide (DEMADEX) 20 MG tablet TAKE 2 TABLETS BY MOUTH TWICE DAILY -STOP LASIX 120 tablet 6   No current facility-administered medications on file prior to visit.        Objective:   Physical ExamBlood pressure 150/60, pulse 80, temperature 97.8 F (36.6 C), height 5\' 8"  (1.727 m), weight 295 lb 1.6 oz (133.856 kg).  Alert and oriented. Skin warm and dry. Oral mucosa is moist.   . Sclera anicteric, conjunctivae is pink. Thyroid not enlarged. No cervical lymphadenopathy. Lungs clear. Heart regular rate and rhythm.  Abdomen is soft. Bowel sounds are positive. No hepatomegaly. No abdominal masses felt. No tenderness.  No edema to lower extremities.         Assessment & Plan:  Urgency and sometimes incontience when going from a sitting to standing positive. Needs to increase fiber in diet.

## 2015-12-29 ENCOUNTER — Ambulatory Visit (INDEPENDENT_AMBULATORY_CARE_PROVIDER_SITE_OTHER): Payer: PPO | Admitting: Neurology

## 2015-12-29 ENCOUNTER — Encounter: Payer: Self-pay | Admitting: Neurology

## 2015-12-29 VITALS — BP 146/62 | HR 80 | Resp 18 | Ht 68.0 in | Wt 295.0 lb

## 2015-12-29 DIAGNOSIS — G4733 Obstructive sleep apnea (adult) (pediatric): Secondary | ICD-10-CM | POA: Diagnosis not present

## 2015-12-29 DIAGNOSIS — G4750 Parasomnia, unspecified: Secondary | ICD-10-CM | POA: Diagnosis not present

## 2015-12-29 DIAGNOSIS — R269 Unspecified abnormalities of gait and mobility: Secondary | ICD-10-CM

## 2015-12-29 DIAGNOSIS — E0842 Diabetes mellitus due to underlying condition with diabetic polyneuropathy: Secondary | ICD-10-CM | POA: Diagnosis not present

## 2015-12-29 DIAGNOSIS — G4719 Other hypersomnia: Secondary | ICD-10-CM | POA: Diagnosis not present

## 2015-12-29 NOTE — Patient Instructions (Addendum)

## 2015-12-29 NOTE — Progress Notes (Signed)
Subjective:    Patient ID: Laura Atkins is a 75 y.o. female.  HPI     Star Age, MD, PhD [facility] [address], [address] [place], [place] [address]  Dear Dr. Moshe Cipro,  I saw your patient, Laura Atkins, upon your kind request in my neurologic clinic today for initial consultation of her sleep disorder, in particular, parasomnias reported and concern for underlying obstructive sleep apnea. The patient is accompanied by her husband today. Of note, the patient no showed for an appointment on 12/22/2015. As you know, Laura Atkins is a 75 year old right-handed woman with an underlying complex medical history of undiabetes, hypertension, hyperlipidemia, anemia, anxiety, depression, reflux disease, osteoarthritis, gait disorder, fecal incontinence, and morbid obesity, who was previously diagnosed with severe obstructive sleep apnea but has not been using CPAP due to difficulty tolerating it as I understand. She had prior sleep study testing in 2003 and 2010. I reviewed sleep study results from 02/23/2009, sleep study was performed at [facility] and interpreted by Dr. Merlene Laughter: This was a split-night sleep study, diagnostic AHI was 31.5. The patient was titrated on CPAP with an optimal pressure of 15 cm. There was no PLMS. I reviewed your office note from 12/12/2015. She also reports restless sleep, vivid dreams, her husband reports that she talks in her sleep, even fights in her sleep and feels her arms and legs, tosses and turns. She has fallen out of bed and now she uses a rail in her bed.   She is tired during the day and falls asleep during the day, inadvertently. Her Epworth sleepiness score is 18/24, her FSS is 53/63. She is afraid of a lot of things. She watches TV at night, also in bed, does not keep a schedule for her sleep, may go to bed as late as 2 AM, Does not keep a scheduled for her wakeup time. She reports occasional morning headaches, she  has nocturnal incontinence but tries to go to the bathroom at least twice per night. She denies frank restless leg symptoms but moves a lot and tosses and turns according to her husband. She is on disability. She lives with her husband and daughter. She drinks a lot of caffeine in the form of soda, about a 2 L bottle per day of regular Pepsi. She occasionally drinks coffee. She does not smoke or drink alcohol. She has multiple specialists: sees Dr. Zigmund Daniel for her eyes, Dr. Arnoldo Lenis for DM, Dr. Harl Bowie in cardiology, Dr. Harrington Challenger in psychiatry. She has seen Dr. Krista Blue for gait disorder in our office for her gait d/o.  Of note, the patient was hospitalized from 01/23/2012 through 01/29/2012 secondary to headache and altered mental status. She was diagnosed with seizures secondary to Xanax withdrawal, polypharmacy, she had acute respiratory failure and viral illness, A1c was 13 at the time. I reviewed the hospital records from that admission. She is currently on Zoloft 100 mg twice daily.  Her Past Medical History Is Significant For: Past Medical History  Diagnosis Date  . Hypertension   . Diabetes mellitus   . Hyperlipidemia   . Obesity   . Anemia   . Anxiety and depression   . GERD (gastroesophageal reflux disease)   . Pruritus   . Osteoarthritis     Left knee; right shoulder; chronic neck and back pain  . Obstructive sleep apnea   . Urinary incontinence   . Tremor     This started months ago after her seizure progressing  to very poor hand writing  . Seizures ([place])   . Depression   . Anxiety   . Headache(784.0)     Her Past Surgical History Is Significant For: Past Surgical History  Procedure Laterality Date  . Abdominal hysterectomy    . Breast excisional biopsy      Left; cyst  . Cholecystectomy    . Cataract extraction w/ intraocular lens implant Left 09/07/2013  . Eye surgery Left 09/07/2013    cataract  . Colonoscopy    . Colonoscopy N/A 07/20/2015    Procedure: COLONOSCOPY;   Surgeon: Rogene Houston, MD;  Location: [facility];  Service: Endoscopy;  Laterality: N/A;  930    Her Family History Is Significant For: Family History  Problem Relation Age of Onset  . ADD / ADHD Grandchild   . Bipolar disorder Grandchild   . Bipolar disorder Daughter   . Alcohol abuse Neg Hx   . Drug abuse Neg Hx   . Cancer Mother 47    lung   . Kidney disease Father   . Diabetes Sister     Her Social History Is Significant For: Social History   Social History  . Marital Status: Married    Spouse Name: saunders  . Number of Children: 5  . Years of Education: 12   Occupational History  . Disabled     Social History Main Topics  . Smoking status: Never Smoker   . Smokeless tobacco: Never Used  . Alcohol Use: No  . Drug Use: No  . Sexual Activity: Yes    Birth Control/ Protection: Surgical   Other Topics Concern  . None   Social History Narrative   Patient lives at home with her husband [name].  Patient is retired.    Right handed.    Five Children.    Caffeine- 2 daily    Her Allergies Are:  Allergies  Allergen Reactions  . Penicillins Shortness Of Breath, Itching and Rash  . Prednisone Shortness Of Breath, Itching and Rash  . Propoxyphene N-Acetaminophen Itching and Nausea And Vomiting  :   Her Current Medications Are:  Outpatient Encounter Prescriptions as of 12/29/2015  Medication Sig  . acetaminophen (EXTRA STRENGTH PAIN RELIEF) 500 MG tablet Take 500 mg by mouth every 6 (six) hours as needed for pain.  Marland Kitchen amLODipine (NORVASC) 10 MG tablet TAKE 1 TABLET BY MOUTH EVERY DAY  . aspirin 81 MG tablet Take 81 mg by mouth daily.    Marland Kitchen atorvastatin (LIPITOR) 40 MG tablet TAKE 1 TABLET BY MOUTH EVERY DAY  . benazepril (LOTENSIN) 20 MG tablet TAKE 1 TABLET BY MOUTH EVERY DAY  . diclofenac sodium (VOLTAREN) 1 % GEL APPLY 4 GRAMS TOPICALLY TO AFFECTED AREA(S) FOUR TIMES DAILY  . diphenhydrAMINE (BENADRYL) 25 mg capsule Take 25 mg by mouth every 6 (six)  hours as needed. Reported on 10/18/2015  . gabapentin (NEURONTIN) 400 MG capsule TAKE 1 CAPSULE BY MOUTH TWICE DAILY AND TAKE 2 CAPSULES AT BEDTIME - DOSE INCREASE SB 04/14/15  . insulin NPH-regular Human (NOVOLIN 70/30) (70-30) 100 UNIT/ML injection 50 units with breakfast and 25 units with supper, and syringes 2/day.  . Multiple Vitamins-Minerals (CENTRUM SILVER ULTRA WOMENS PO) Take 1 tablet by mouth daily.   Marland Kitchen omeprazole (PRILOSEC) 20 MG capsule TAKE 1 CAPSULE BY MOUTH DAILY.  . ONE TOUCH ULTRA TEST test strip USE TO TEST BLOOD SUGAR FOUR TIMES DAILY  . potassium chloride SA (K-DUR,KLOR-CON) 20 MEQ tablet TAKE 1 TABLET BY  MOUTH TWICE DAILY  . sertraline (ZOLOFT) 100 MG tablet Take 1 tablet (100 mg total) by mouth 2 (two) times daily.  Marland Kitchen torsemide (DEMADEX) 20 MG tablet TAKE 2 TABLETS BY MOUTH TWICE DAILY -STOP LASIX   No facility-administered encounter medications on file as of 12/29/2015.  :  Review of Systems:  Out of a complete 14 point review of systems, all are reviewed and negative with the exception of these symptoms as listed below:   Review of Systems  Constitutional: Positive for fatigue.       Weight gain   Neurological:       No trouble falling asleep, trouble staying asleep, restless sleep, sleep talks, snoring, witnessed apnea, wakes up feeling tired, restless legs, memory loss, confusion, tremor, daytime tiredness, sometimes takes naps.   Psychiatric/Behavioral:       Depression, anxiety, not enough sleep, decreased energy, change in appetite.    Epworth Sleepiness Scale 0= would never doze 1= slight chance of dozing 2= moderate chance of dozing 3= high chance of dozing  Sitting and reading:1 Watching TV:2 Sitting inactive in a public place (ex. Theater or meeting):2 As a passenger in a car for an hour without a break:3 Lying down to rest in the afternoon:3 Sitting and talking to someone:3 Sitting quietly after lunch (no alcohol):2 In a car, while stopped in  traffic:2 Total:18  Objective:  Neurologic Exam  Physical Exam Physical Examination:   Filed Vitals:   12/29/15 1115  BP: 146/62  Pulse: 80  Resp: 18   General Examination: The patient is a very pleasant 75 y.o. female in no acute distress. She appears well-developed and well-nourished and well groomed. She is in good spirits today. She is morbidly obese. She brought her 2 wheeled walker.  HEENT: Normocephalic, atraumatic, pupils are equal, round and reactive to light and accommodation. Funduscopic exam is normal on the left which is status post cataract extraction and difficult on the right due to cataract. Extraocular tracking is good without limitation to gaze excursion or nystagmus noted. Normal smooth pursuit is noted. Hearing is grossly intact. Face is symmetric with normal facial animation and normal facial sensation. Speech is clear with no dysarthria noted. There is no hypophonia. There is no lip, neck/head, jaw or voice tremor. Neck is supple with full range of passive and active motion. There are no carotid bruits on auscultation. Oropharynx exam reveals: moderate mouth dryness, adequate dental hygiene with dentures on top and missing teeth in the bottom and moderate airway crowding, due to large tongue, longer uvula, tonsils of 1+. Mallampati is class I. Tongue protrudes centrally and palate elevates symmetrically. Neck size is 17 1/8 inches. She has a an underbite.    Chest: Clear to auscultation without wheezing, rhonchi or crackles noted.  Heart: S1+S2+0, regular and normal without murmurs, rubs or gallops noted.   Abdomen: Soft, non-tender and non-distended with normal bowel sounds appreciated on auscultation.  Extremities: There is 2+ pitting edema in the distal lower extremities bilaterally. Pedal pulses are intact.  Skin: Warm and dry without trophic changes noted.   Musculoskeletal: exam reveals no obvious joint deformities, tenderness or joint swelling or erythema.    Neurologically:  Mental status: The patient is awake, alert and oriented in all 4 spheres. Her immediate and remote memory, attention, language skills and fund of knowledge are appropriate. There is no evidence of aphasia, agnosia, apraxia or anomia. Speech is clear with normal prosody and enunciation. Thought process is linear. Mood is normal and affect  is normal.  Cranial nerves II - XII are as described above under HEENT exam. In addition: shoulder shrug is normal with equal shoulder height noted. Motor exam: Normal bulk, strength and tone is noted. There is no drift, tremor or rebound. Romberg is negative. Reflexes are  1+ in the upper extremities and absent in the lower extremities. Fine motor skills and coordination: intact  in the upper extremities and mildly impaired in the lower extremities. Heel-to-shin is not possible for her.  Cerebellar testing: No dysmetria or intention tremor on finger to nose testing. Heel to shin is unremarkable bilaterally. There is no truncal or gait ataxia.  Sensory exam: intact to light touch, pinprick, vibration, temperature sense in the upper extremities with decreased sensation to all modalities in the lower extremities, up to the knees.  Gait, station and balance: She stands with significant difficulty and needs assistance, in addition to pushing herself up with her arms. She has a age-appropriate posture but stands wide-based. Tandem walk is not possible. She walks slowly with her 2 wheeled walker and turns in 3 steps. Balance is impaired.   Assessment and Plan:   In summary, Laura Atkins is a very pleasant 75 y.o.-year old female with an underlying complex medical history of undiabetes, hypertension, hyperlipidemia, anemia, anxiety, depression, reflux disease, osteoarthritis, gait disorder, fecal incontinence, and morbid obesity, whose history and physical exam are in keeping with obstructive sleep apnea (OSA). she was previously diagnosed with severe  obstructive sleep apnea. In addition, she reports parasomnias including vivid dreams, and her husband reports that she has acted out in her dreams, this could be in keeping with REM behavior disorder.  I had a long chat with the patient and her husband about my findings and the diagnosis of OSA, its prognosis and treatment options. We talked about medical treatments, surgical interventions and non-pharmacological approaches. I explained in particular the risks and ramifications of untreated moderate to severe OSA, especially with respect to developing cardiovascular disease down the Road, including congestive heart failure, difficult to treat hypertension, cardiac arrhythmias, or stroke. Even type 2 diabetes has, in part, been linked to untreated OSA. Symptoms of untreated OSA include daytime sleepiness, memory problems, mood irritability and mood disorder such as depression and anxiety, lack of energy, as well as recurrent headaches, especially morning headaches. We talked about trying to maintain a healthy lifestyle in general, as well as the importance of weight control. I encouraged the patient to eat healthy, exercise daily and keep well hydrated, to keep a scheduled bedtime and wake time routine, to not skip any meals and eat healthy snacks in between meals. I advised the patient not to drive when feeling sleepy. Of note, the patient has not driven a car in 3 years. She is strongly advised to reduce her caffeine intake gradually and increase her water intake. We talked about sleep hygiene as well. She is discouraged from watching TV in bed and encouraged to keep a set schedule for her wake time and sleep time. I recommended the following at this time: sleep study with potential positive airway pressure titration. (We will score hypopneas at 4% and split the sleep study into diagnostic and treatment portion, if the estimated. 2 hour AHI is >15/h).   I explained the sleep test procedure to the patient and  also outlined possible surgical and non-surgical treatment options of OSA, including the use of a custom-made dental device (which would require a referral to a specialist dentist or oral surgeon), upper airway  surgical options, such as pillar implants, radiofrequency surgery, tongue base surgery, and UPPP (which would involve a referral to an ENT surgeon). Rarely, jaw surgery such as mandibular advancement may be considered.  I also explained the CPAP treatment option to the patient, who indicated that she would be willing to try CPAP if the need arises. I explained the importance of being compliant with PAP treatment, not only for insurance purposes but primarily to improve Her symptoms, and for the patient's long term health benefit, including to reduce Her cardiovascular risks. I answered all their questions today and the patient and her husband were in agreement. I would like to see her back after the sleep study is completed and encouraged her to call with any interim questions, concerns, problems or updates.   Thank you very much for allowing me to participate in the care of this nice patient. If I can be of any further assistance to you please do not hesitate to call me at [phone].  Sincerely,   Star Age, MD, PhD

## 2016-01-02 ENCOUNTER — Ambulatory Visit (HOSPITAL_COMMUNITY): Payer: PPO | Attending: Family Medicine | Admitting: Physical Therapy

## 2016-01-02 DIAGNOSIS — M6281 Muscle weakness (generalized): Secondary | ICD-10-CM | POA: Insufficient documentation

## 2016-01-02 DIAGNOSIS — R296 Repeated falls: Secondary | ICD-10-CM | POA: Insufficient documentation

## 2016-01-02 DIAGNOSIS — R262 Difficulty in walking, not elsewhere classified: Secondary | ICD-10-CM | POA: Insufficient documentation

## 2016-01-02 DIAGNOSIS — R2681 Unsteadiness on feet: Secondary | ICD-10-CM | POA: Insufficient documentation

## 2016-01-04 ENCOUNTER — Encounter (HOSPITAL_COMMUNITY): Payer: Self-pay

## 2016-01-04 ENCOUNTER — Ambulatory Visit (HOSPITAL_COMMUNITY): Payer: PPO

## 2016-01-04 DIAGNOSIS — M6281 Muscle weakness (generalized): Secondary | ICD-10-CM

## 2016-01-04 DIAGNOSIS — R262 Difficulty in walking, not elsewhere classified: Secondary | ICD-10-CM

## 2016-01-04 DIAGNOSIS — R296 Repeated falls: Secondary | ICD-10-CM

## 2016-01-04 DIAGNOSIS — R2681 Unsteadiness on feet: Secondary | ICD-10-CM | POA: Diagnosis not present

## 2016-01-04 NOTE — Therapy (Signed)
Laura Atkins, [place], [address] Phone: [phone]   Fax:  [phone]  Physical Therapy Evaluation  Patient Details  Name: Laura Atkins MRN: [id number] Date of Birth: May 22, 1941 Referring Provider: Dr. Tula Atkins  Encounter Date: 01/04/2016      PT End of Session - 01/04/16 1549    Visit Number 1   Number of Visits 17   Date for PT Re-Evaluation 02/03/16   Authorization Type [employer]   Authorization Time Period 01/04/2016 to 02/29/2016   PT Start Time 1347   PT Stop Time 1429   PT Time Calculation (min) 42 min   Equipment Utilized During Treatment Gait belt   Activity Tolerance Patient tolerated treatment well   Behavior During Therapy [facility] for tasks assessed/performed      Past Medical History  Diagnosis Date  . Hypertension   . Diabetes mellitus   . Hyperlipidemia   . Obesity   . Anemia   . Anxiety and depression   . GERD (gastroesophageal reflux disease)   . Pruritus   . Osteoarthritis     Left knee; right shoulder; chronic neck and back pain  . Obstructive sleep apnea   . Urinary incontinence   . Tremor     This started months ago after her seizure progressing to very poor hand writing  . Seizures ([place])   . Depression   . Anxiety   . [id number])     Past Surgical History  Procedure Laterality Date  . Abdominal hysterectomy    . Breast excisional biopsy      Left; cyst  . Cholecystectomy    . Cataract extraction w/ intraocular lens implant Left 09/07/2013  . Eye surgery Left 09/07/2013    cataract  . Colonoscopy    . Colonoscopy N/A 07/20/2015    Procedure: COLONOSCOPY;  Surgeon: Laura Houston, MD;  Location: [facility];  Service: Endoscopy;  Laterality: N/A;  930    There were no vitals filed for this visit.       Subjective Assessment - 01/04/16 1359    Subjective Laura Atkins is a 75 yo female who currently c/o LE weakness and difficulty with walking.  Patient reported a hx of 3-4 falls within the last 2 years. Patient denied falls within the last 6 months. She further reported that within the last year she has noticed a steady decline in her strength and activity levels. She further reported that she has a hx of peripheral neuropathy, which causes her B LE "to hurt sometimes". She reports that she is able to complete basic ADLs and ambulate independently with her FWW, but is slow and requires additional time. The patient is currently living with her husband and daughter who are currently providing assistance with household chores and transportation.    Pertinent History Tremor, hx of falls, peripheral neuropathy, HTN   Limitations Standing;Walking   How long can you sit comfortably? Unlimited    How long can you stand comfortably? Unknown per patient report   How long can you walk comfortably? Unknown per patient report   Diagnostic tests N/A   Patient Stated Goals Patient's goals include improving her B LE strength and to ambulate with less difficulty    Currently in Pain? No/denies   Pain Location Leg  pain t/o B LE   Pain Orientation Right;Left   Pain Descriptors / Indicators Aching   Pain Type Chronic pain   Pain Radiating Towards None  Pain Onset More than a month ago   Pain Frequency Intermittent   Aggravating Factors  WB activities and prolonged standing    Pain Relieving Factors sitting and rest    Effect of Pain on Daily Activities Limits the patient's ability to ambulate long distance and stand for long periods of time             [facility] PT Assessment - 01/04/16 0001    Assessment   Medical Diagnosis Abnormality of gait and B LE weakness   Referring Provider Dr. Tula Atkins   Onset Date/Surgical Date 11/23/15   Hand Dominance Right   Next MD Visit May 2017   Prior Therapy Yes, for same complaints   Precautions   Precautions Fall   Restrictions   Weight Bearing Restrictions No   Balance Screen   Has the  patient fallen in the past 6 months No   Has the patient had a decrease in activity level because of a fear of falling?  Yes   Is the patient reluctant to leave their home because of a fear of falling?  Yes   [place] Private residence   Living Arrangements Spouse/significant other   Available Help at Discharge Family   Type of [place] One level;Other (Comment)  with ramp to enter    [place] - 2 wheels;[place] - 4 wheels   Prior Function   Level of Independence Independent with basic ADLs;Needs assistance with homemaking   Cognition   Overall Cognitive Status Within Functional Limits for tasks assessed   Observation/Other Assessments   Focus on Therapeutic Outcomes (FOTO)  71% limited    Sensation   Light Touch --  hx of peripheral neuropathy    ROM / Strength   AROM / PROM / Strength Strength;AROM   AROM   Overall AROM Comments B hip and knee AROM is WFL with muscle tightness assessed of B hip flexors/quads, HS, and gastroc-soleus    Strength   Strength Assessment Site Knee;Hip;Ankle   Right/Left Hip Right;Left   Right Hip Flexion 3+/5   Right Hip Extension 3-/5   Right Hip ABduction 3/5   Left Hip Flexion 3+/5   Left Hip Extension 3-/5   Left Hip ABduction 3/5   Right Knee Flexion 4-/5   Right Knee Extension 5/5   Left Knee Flexion 4-/5   Left Knee Extension 5/5   Right/Left Ankle Right;Left   Right Ankle Dorsiflexion 4/5   Right Ankle Plantar Flexion 3/5   Left Ankle Dorsiflexion 5/5   Left Ankle Plantar Flexion 3/5   Flexibility   Soft Tissue Assessment /Muscle Length yes   Bed Mobility   Bed Mobility Rolling Right;Rolling Left;Supine to Sit;Sit to Supine   Rolling Right 6: Modified independent (Device/Increase time)   Rolling Left 6: Modified independent (Device/Increase time)   Supine to Sit 6: Modified independent (Device/Increase time)   Sit to Supine 6: Modified independent (Device/Increase time)    Transfers   Transfers Sit to Stand;Stand to Sit;Stand Pivot Transfers   Sit to Stand 5: Supervision   Sit to Stand Details (indicate cue type and reason) requires B UE assist   Five time sit to stand comments  55.35 sec  with B UE assist required   Stand to Sit 5: Supervision   Stand to Sit Details (indicate cue type and reason) Verbal cues for technique   Stand to Sit Details requires B UE assist   Stand  Pivot Transfers 5: Supervision   Stand Pivot Transfer Details (indicate cue type and reason) requires B UE assist   Ambulation/Gait   Ambulation/Gait Yes   Ambulation/Gait Assistance 5: Supervision   Ambulation Distance (Feet) 100 Feet   Assistive device Rolling walker   Gait Pattern Step-through pattern;Decreased step length - right;Decreased step length - left;Decreased dorsiflexion - right;Shuffle;Trunk flexed   Ambulation Surface Level;Indoor   Balance   Balance Assessed Yes   Static Standing Balance   Static Standing Balance -  Activities  Romberg - Eyes Opened;Romberg - Eyes Closed   Static Standing - Comment/# of Minutes EO= 30 sec/ EC= 19 seconds with opening of eyes and adjustment of feet    Standardized Balance Assessment   Standardized Balance Assessment Timed Up and Go Test   Timed Up and Go Test   Normal TUG (seconds) 37   TUG Comments with FWW             PT Education - 01/04/16 1529    Education provided Yes   Education Details PT eval findings, 5/5 fall precautions, and proper heel to toe gait pattern    Person(s) Educated Patient   Methods Explanation;Demonstration   Comprehension Verbalized understanding;Returned demonstration;Need further instruction          PT Short Term Goals - 01/04/16 1355    PT SHORT TERM GOAL #1   Title Patient will independently verbalize and demo initial HEP in order to improve B LE strength with ambulation and transfers.    Time 2   Period Weeks   Status New   PT SHORT TERM GOAL #2   Title Patient will independently  verbalize 5/5 fall precautions in order to reduce the risk for falls with household ambulation.    Time 3   Period Weeks   Status New   PT SHORT TERM GOAL #3   Title Patient will ambulate indoors on level terrain for 300' x 1 with distant supervision with the use of a FWW with improved heel to toe sequence and cadence in order to improve tolerance and safety with community ambulation.   Time 4   Period Weeks   Status New   PT SHORT TERM GOAL #4   Title Patient will improve her 5 sit to stand time to <25 seconds in order to improve performance and safety with sit<>stand transfers.    Time 4   Period Weeks   Status New           PT Long Term Goals - 01/04/16 1354    PT LONG TERM GOAL #1   Title Patient will independently ambulate indoors and outdoors on level terrain for >350' x 1 with the use of a SPC with improved heel to toe sequence and cadence in order to improve tolerance and safety with community ambulation.   Time 8   Period Weeks   Status New   PT LONG TERM GOAL #2   Title Patient will improve B hip strength to 4/5 MMT grade in order to improve standing balance and performance with standing ADLs.    Time 8   Period Weeks   Status New   PT LONG TERM GOAL #3   Title Patient will demo improved TUG time to <17 seconds in order to reduce the risk for falls with household ambulation.    Time 8   Period Weeks   Status New   PT LONG TERM GOAL #4   Title Patient will improve her FOTO limitation score  to <50% in order to improve her quality of life and independence with ADLs.    Time 8   Period Weeks   Status New   PT LONG TERM GOAL #5   Title Patient will independently verbalize and demo advanced HEP in order to improve B LE strength with ambulation and transfers once DC from PT.    Time 8   Period Weeks   Status New               Plan - 01/04/16 1530    Clinical Impression Statement Mrs. Lober is a 75 yo female who was referred to outpatient PT secondary to  abnormality of gait and weakness. The patient presents with signs and symptoms that are consistent with MD referral of unsteady gait and B LE weakness. The patient is at a high risk for falls as evident by TUG time of 37 seconds with the use of a FWW and repeated 5 sit to stand test time of 55.35 seconds with need for B UE assist. Furthermore, she presented with impaired balance with Romberg with EC at 19 sec. Upon examination, the patient presented with limitations and impairments including B LE weakness (specifically hip weakness), impaired static/dynamic standing balance, difficulty with transfers, impaired endurance, impaired activity tolerance, and unsteady gait with gait deviations. Therapist adjusted the patient's FWW to a lower height in order to improve B UE alignment and posture. Patient demo improved gait pattern and posture once FWW was adjusted. Therapist thoroughly instructed the patient on proper heel to toe sequence and to maintain upright posture. Improved gait pattern demo once instructions/education was provided. Further instructed the patient on 5/5 fall precautions in order to reduce the risk for falls with household ambulation. The patient verbalized understanding, but had difficulty recalling provided precautions. Further education is required with subsequent visits. The patient would benefit from skilled PT for 2x/week for 8 weeks to address current deficits in order to reduce her risk for falls and improve her ability to complete functional mobility activities with less difficulty. The patient is in agreement with proposed PT POC and frequency.    Rehab Potential Fair   Clinical Impairments Affecting Rehab Potential Co-morbidities and minimal motivation    PT Frequency 2x / week   PT Duration 8 weeks   PT Treatment/Interventions ADLs/Self Care Home Management;Cryotherapy;Moist Heat;Gait training;DME Instruction;Stair training;Functional mobility training;Therapeutic  activities;Therapeutic exercise;Balance training;Neuromuscular re-education;Patient/family education;Passive range of motion;Manual techniques   PT Next Visit Plan Next visit to focus on gait training with a FWW, B hip strengthening ther ex in seated position, and static/dynamic standing balance training    PT Home Exercise Plan Added repeated sit<>stands to HEP, 5 reps, 3-4x/day; Review and update HEP with addition of seated hip strengthening ther ex.   Recommended Other Services None at this time    Consulted and Agree with Plan of Care Patient      Patient will benefit from skilled therapeutic intervention in order to improve the following deficits and impairments:  Abnormal gait, Decreased strength, Improper body mechanics, Postural dysfunction, Difficulty walking, Decreased mobility, Impaired flexibility, Obesity, Decreased range of motion, Decreased balance, Pain, Decreased endurance  Visit Diagnosis: Difficulty in walking, not elsewhere classified - Plan: PT plan of care cert/re-cert  Muscle weakness (generalized) - Plan: PT plan of care cert/re-cert  Unsteadiness on feet - Plan: PT plan of care cert/re-cert  Repeated falls - Plan: PT plan of care cert/re-cert      G-Codes - [id number]    Functional  Assessment Tool Used FOTO and clinicial findings including MMT, TUG, 5 STS, gait quality/tolerance, and status with transfers   Functional Limitation Mobility: Walking and moving around   Mobility: Walking and Moving Around Current Status [phone]) At least 60 percent but less than 80 percent impaired, limited or restricted   Mobility: Walking and Moving Around Goal Status [phone]) At least 40 percent but less than 60 percent impaired, limited or restricted       Problem List Patient Active Problem List   Diagnosis Date Noted  . Sleep terror disorder 12/14/2015  . Fecal incontinence 12/13/2015  . Medicare annual wellness visit, subsequent 08/10/2015  . Back pain of lumbar region  with sciatica 04/15/2015  . Shoulder pain, right 04/14/2015  . Spondylosis, cervical, with myelopathy 04/14/2015  . Bilateral leg edema 12/13/2014  . Leg swelling 09/06/2014  . Pruritus 09/06/2014  . PVD (peripheral vascular disease) ([place]) 01/28/2014  . OSA (obstructive sleep apnea) 04/10/2013  . Peripheral neuropathy ([place]) 03/12/2013  . Hearing loss 11/06/2012  . UNSTEADY GAIT 11/07/2010  . Microcytic anemia 01/26/2010  . NECK PAIN, CHRONIC 10/21/2008  . Diabetes mellitus, insulin dependent (IDDM), uncontrolled ([place]) 04/21/2008  . Hyperlipemia 04/21/2008  . Morbid obesity ([place]) 04/21/2008  . Anxiety and depression 04/21/2008  . Essential hypertension 04/21/2008  . GERD 04/21/2008  . OSTEOARTHRITIS 04/21/2008  . URINARY INCONTINENCE 04/21/2008    Garen Lah, PT, DPT    01/04/2016, 3:55 PM  [place] [address] [place], [place], [address] Phone: [phone]   Fax:  [phone]  Name: Laura Atkins MRN: [id number] Date of Birth: 10-22-1940

## 2016-01-10 ENCOUNTER — Institutional Professional Consult (permissible substitution): Payer: PPO | Admitting: Neurology

## 2016-01-10 ENCOUNTER — Telehealth (HOSPITAL_COMMUNITY): Payer: Self-pay | Admitting: Physical Therapy

## 2016-01-10 ENCOUNTER — Ambulatory Visit (HOSPITAL_COMMUNITY): Payer: PPO | Admitting: Physical Therapy

## 2016-01-10 NOTE — Telephone Encounter (Signed)
Patient a no-show (#1) for today's session. Attempted to call however no answer and voicemail not set up- unable to leave message.  Deniece Ree PT, DPT [phone]

## 2016-01-13 ENCOUNTER — Ambulatory Visit (HOSPITAL_COMMUNITY): Payer: PPO | Admitting: Physical Therapy

## 2016-01-13 ENCOUNTER — Telehealth (HOSPITAL_COMMUNITY): Payer: Self-pay | Admitting: Physical Therapy

## 2016-01-13 NOTE — Telephone Encounter (Signed)
Called pt re : missed appointment.  Pt states that she was unaware that she had an appointment.  Pt given time of next appointment.  Wed. 01/18/2016 at 10:30 .  Rayetta Humphrey, [place] CLT [phone]

## 2016-01-16 ENCOUNTER — Encounter (INDEPENDENT_AMBULATORY_CARE_PROVIDER_SITE_OTHER): Payer: Self-pay | Admitting: Ophthalmology

## 2016-01-16 ENCOUNTER — Ambulatory Visit (INDEPENDENT_AMBULATORY_CARE_PROVIDER_SITE_OTHER): Payer: PPO | Admitting: Neurology

## 2016-01-16 DIAGNOSIS — G4733 Obstructive sleep apnea (adult) (pediatric): Secondary | ICD-10-CM | POA: Diagnosis not present

## 2016-01-16 DIAGNOSIS — G4734 Idiopathic sleep related nonobstructive alveolar hypoventilation: Secondary | ICD-10-CM

## 2016-01-16 DIAGNOSIS — G479 Sleep disorder, unspecified: Secondary | ICD-10-CM

## 2016-01-16 DIAGNOSIS — G4720 Circadian rhythm sleep disorder, unspecified type: Secondary | ICD-10-CM

## 2016-01-17 ENCOUNTER — Telehealth: Payer: Self-pay | Admitting: Neurology

## 2016-01-17 DIAGNOSIS — G4733 Obstructive sleep apnea (adult) (pediatric): Secondary | ICD-10-CM

## 2016-01-17 DIAGNOSIS — G4720 Circadian rhythm sleep disorder, unspecified type: Secondary | ICD-10-CM

## 2016-01-17 DIAGNOSIS — G4734 Idiopathic sleep related nonobstructive alveolar hypoventilation: Secondary | ICD-10-CM

## 2016-01-17 NOTE — Telephone Encounter (Signed)
Patient referred by Dr. Moshe Cipro, seen by me on 12/29/15, diagnostic PSG on 01/16/16, ins: HTA/MCR.   Please call and notify the patient that the recent sleep study did confirm the diagnosis of mod to severe obstructive sleep apnea with severe desats during supine sleep and that I recommend treatment for this in the form of CPAP. This will require a repeat sleep study for proper titration and mask fitting. Please explain to patient and arrange for a CPAP titration study. I have placed an order in the chart. Thanks, and please route to [facility] for scheduling next sleep study.  Star Age, MD, PhD [facility])

## 2016-01-17 NOTE — Telephone Encounter (Signed)
I spoke to patient and she is aware of results and recommendations. She is willing to proceed with titration study.

## 2016-01-17 NOTE — Telephone Encounter (Signed)
REturning the nurses call

## 2016-01-17 NOTE — Telephone Encounter (Signed)
VM not set up yet for pt. Could not LVM. Unable to reach pt.  Called daughter, [name] who is listed on DPR. Advised we cannot reach pt and to have her call our office regarding sleep study results. She verbalized understanding and will let her mother know to call our office. Advised phone staff will get the call and either take a message or see if a nurse is available to speak with her.

## 2016-01-18 ENCOUNTER — Ambulatory Visit (HOSPITAL_COMMUNITY): Payer: PPO

## 2016-01-18 DIAGNOSIS — R2681 Unsteadiness on feet: Secondary | ICD-10-CM

## 2016-01-18 DIAGNOSIS — R262 Difficulty in walking, not elsewhere classified: Secondary | ICD-10-CM | POA: Diagnosis not present

## 2016-01-18 DIAGNOSIS — R296 Repeated falls: Secondary | ICD-10-CM

## 2016-01-18 DIAGNOSIS — M6281 Muscle weakness (generalized): Secondary | ICD-10-CM

## 2016-01-18 NOTE — Therapy (Signed)
[facility] [address] [place], [place], [address] Phone: [phone]   Fax:  [phone]  Physical Therapy Treatment  Patient Details  Name: Laura Atkins MRN: [id number] Date of Birth: 11-16-1940 Referring Provider: Dr. Tula Nakayama  Encounter Date: 01/18/2016      PT End of Session - 01/18/16 1319    Visit Number 2   Number of Visits 17   Date for PT Re-Evaluation 02/03/16   Authorization Type [employer]   Authorization Time Period 01/04/2016 to 02/29/2016   PT Start Time 1312   PT Stop Time 1350   PT Time Calculation (min) 38 min   Equipment Utilized During Treatment Gait belt   Activity Tolerance Patient tolerated treatment well   Behavior During Therapy [facility] for tasks assessed/performed      Past Medical History  Diagnosis Date  . Hypertension   . Diabetes mellitus   . Hyperlipidemia   . Obesity   . Anemia   . Anxiety and depression   . GERD (gastroesophageal reflux disease)   . Pruritus   . Osteoarthritis     Left knee; right shoulder; chronic neck and back pain  . Obstructive sleep apnea   . Urinary incontinence   . Tremor     This started months ago after her seizure progressing to very poor hand writing  . Seizures ([place])   . Depression   . Anxiety   . [id number])     Past Surgical History  Procedure Laterality Date  . Abdominal hysterectomy    . Breast excisional biopsy      Left; cyst  . Cholecystectomy    . Cataract extraction w/ intraocular lens implant Left 09/07/2013  . Eye surgery Left 09/07/2013    cataract  . Colonoscopy    . Colonoscopy N/A 07/20/2015    Procedure: COLONOSCOPY;  Surgeon: Rogene Houston, MD;  [facility];  Service: Endoscopy;  Laterality: N/A;  930    There were no vitals filed for this visit.      Subjective Assessment - 01/18/16 1316    Subjective Pt late for apt today.  Reports she is pain free today.  Reports she has been in sleep and  stress test.  Reports she has low energy currently, seems to be most difficulty.  No reports of recent falls.   Pertinent History Tremor, hx of falls, peripheral neuropathy, HTN   Patient Stated Goals Patient's goals include improving her B LE strength and to ambulate with less difficulty    Currently in Pain? No/denies           [facility] Adult PT Treatment/Exercise - 01/18/16 0001    Ambulation/Gait   Ambulation/Gait Yes   Ambulation/Gait Assistance 5: Supervision   Ambulation Distance (Feet) 423 Feet  8 minutes cueing for heel to toe   Assistive device Rolling walker   Gait Pattern Step-through pattern;Decreased step length - right;Decreased step length - left;Decreased dorsiflexion - right;Shuffle;Trunk flexed   Ambulation Surface Level;Indoor   Exercises   Exercises Knee/Hip   Knee/Hip Exercises: Seated   Other Seated Knee/Hip Exercises Toe raises   Marching Both;10 reps   Abduction/Adduction  10 reps;Limitations   Abd/Adduction Limitations RTB   Sit to Sand 5 reps;with UE support                  PT Short Term Goals - 01/04/16 1355    PT SHORT TERM GOAL #1   Title Patient will independently verbalize  and demo initial HEP in order to improve B LE strength with ambulation and transfers.    Time 2   Period Weeks   Status New   PT SHORT TERM GOAL #2   Title Patient will independently verbalize 5/5 fall precautions in order to reduce the risk for falls with household ambulation.    Time 3   Period Weeks   Status New   PT SHORT TERM GOAL #3   Title Patient will ambulate indoors on level terrain for 300' x 1 with distant supervision with the use of a FWW with improved heel to toe sequence and cadence in order to improve tolerance and safety with community ambulation.   Time 4   Period Weeks   Status New   PT SHORT TERM GOAL #4   Title Patient will improve her 5 sit to stand time to <25 seconds in order to improve performance and safety with sit<>stand transfers.     Time 4   Period Weeks   Status New           PT Long Term Goals - 01/04/16 1354    PT LONG TERM GOAL #1   Title Patient will independently ambulate indoors and outdoors on level terrain for >350' x 1 with the use of a SPC with improved heel to toe sequence and cadence in order to improve tolerance and safety with community ambulation.   Time 8   Period Weeks   Status New   PT LONG TERM GOAL #2   Title Patient will improve B hip strength to 4/5 MMT grade in order to improve standing balance and performance with standing ADLs.    Time 8   Period Weeks   Status New   PT LONG TERM GOAL #3   Title Patient will demo improved TUG time to <17 seconds in order to reduce the risk for falls with household ambulation.    Time 8   Period Weeks   Status New   PT LONG TERM GOAL #4   Title Patient will improve her FOTO limitation score to <50% in order to improve her quality of life and independence with ADLs.    Time 8   Period Weeks   Status New   PT LONG TERM GOAL #5   Title Patient will independently verbalize and demo advanced HEP in order to improve B LE strength with ambulation and transfers once DC from PT.    Time 8   Period Weeks   Status New               Plan - 01/18/16 1657    Clinical Impression Statement Pt late for apt today.  Reviewed goals, established HEP and copy of evaluation given to pt.  Session focus on gait training with RW with cueing for heel to toe patten, equal stance phase and stride length. Gait training complete with no rest breaks required, slow cadence noted and limiited by fatigue with distance, no LOB episodes noted.  Pt instructed 5/5 fall precautions, pt wearing lace up tennis shoes, ambulating with RW at proper heights, reports she has removed rugs, discussion held about removing all clutter on floor and encouraged pt to instal night lights to reduce risk of falls.  Seated hip strengthening exercises complete and added to HEP printout   Pt required  UE A with sit to stand due to glut weakness.  Pt able to verbalize and demonstrate appropraiate form and technqije with new exercises.  No reports of  pain through session.     Rehab Potential Fair   Clinical Impairments Affecting Rehab Potential Co-morbidities and minimal motivation    PT Frequency 2x / week   PT Duration 8 weeks   PT Treatment/Interventions ADLs/Self Care Home Management;Cryotherapy;Moist Heat;Gait training;DME Instruction;Stair training;Functional mobility training;Therapeutic activities;Therapeutic exercise;Balance training;Neuromuscular re-education;Patient/family education;Passive range of motion;Manual techniques   PT Next Visit Plan Next session focus on proximal musculature strengtheing (supine/sidelying if able to tolerate position), gait training with RW for increased cadence in 6 MWT and static/dynamic standing balance training.     PT Home Exercise Plan Seated hip strengthening exercises added to HEP including sit to stands, marching, abduction and ankle pumps.      Patient will benefit from skilled therapeutic intervention in order to improve the following deficits and impairments:  Abnormal gait, Decreased strength, Improper body mechanics, Postural dysfunction, Difficulty walking, Decreased mobility, Impaired flexibility, Obesity, Decreased range of motion, Decreased balance, Pain, Decreased endurance  Visit Diagnosis: Difficulty in walking, not elsewhere classified  Muscle weakness (generalized)  Unsteadiness on feet  Repeated falls     Problem List Patient Active Problem List   Diagnosis Date Noted  . Sleep terror disorder 12/14/2015  . Fecal incontinence 12/13/2015  . Medicare annual wellness visit, subsequent 08/10/2015  . Back pain of lumbar region with sciatica 04/15/2015  . Shoulder pain, right 04/14/2015  . Spondylosis, cervical, with myelopathy 04/14/2015  . Bilateral leg edema 12/13/2014  . Leg swelling 09/06/2014  . Pruritus 09/06/2014   . PVD (peripheral vascular disease) ([place]) 01/28/2014  . OSA (obstructive sleep apnea) 04/10/2013  . Peripheral neuropathy ([place]) 03/12/2013  . Hearing loss 11/06/2012  . UNSTEADY GAIT 11/07/2010  . Microcytic anemia 01/26/2010  . NECK PAIN, CHRONIC 10/21/2008  . Diabetes mellitus, insulin dependent (IDDM), uncontrolled ([place]) 04/21/2008  . Hyperlipemia 04/21/2008  . Morbid obesity ([place]) 04/21/2008  . Anxiety and depression 04/21/2008  . Essential hypertension 04/21/2008  . GERD 04/21/2008  . OSTEOARTHRITIS 04/21/2008  . URINARY INCONTINENCE 04/21/2008   Ihor Austin, [place]; [place]  Aldona Lento 01/18/2016, 5:14 PM  [facility] [place], [place], [address] Phone: [phone]   Fax:  [phone]  Name: CENDY SAGEL MRN: [id number] Date of Birth: [birth year]

## 2016-01-18 NOTE — Patient Instructions (Signed)
Functional Quadriceps: Sit to Stand    Sit on edge of chair, feet flat on floor. Stand upright, extending knees fully. Repeat 5-10 times per set. Do 1-2 sets per session.  http://orth.exer.us/735   Copyright  VHI. All rights reserved.   Toe Raise (Sitting)    Raise toes, keeping heels on floor. Repeat 10-20  times per set. Do 1-2 sets per session. Do 1-2 sessions per day.  http://orth.exer.us/47   Copyright  VHI. All rights reserved.   FLEXION: Sitting (Active)    Sit, both feet flat. Lift right knee toward ceiling.  Complete 1-2 sets of 10-20 repetitions.   Copyright  VHI. All rights reserved.   ABDUCTION: Sitting - Resistance Band (Active)    Sit with feet flat. Lift right leg slightly and, against yellow resistance band, draw it out to side. Complete 1-2 sets of 10-20 repetitions. Perform 1-2 sessions per day.  Copyright  VHI. All rights reserved.

## 2016-01-20 ENCOUNTER — Ambulatory Visit (HOSPITAL_COMMUNITY): Payer: PPO | Admitting: Physical Therapy

## 2016-01-20 DIAGNOSIS — M6281 Muscle weakness (generalized): Secondary | ICD-10-CM

## 2016-01-20 DIAGNOSIS — R262 Difficulty in walking, not elsewhere classified: Secondary | ICD-10-CM | POA: Diagnosis not present

## 2016-01-20 DIAGNOSIS — R296 Repeated falls: Secondary | ICD-10-CM

## 2016-01-20 DIAGNOSIS — R2681 Unsteadiness on feet: Secondary | ICD-10-CM

## 2016-01-20 NOTE — Therapy (Signed)
[facility] [address] [place], [place], [address] Phone: [phone]   Fax:  [phone]  Physical Therapy Treatment  Patient Details  Name: Laura Atkins MRN: [id number] Date of Birth: Apr 13, 1941 Referring Provider: Dr. Tula Nakayama  Encounter Date: 01/20/2016      PT End of Session - 01/20/16 1038    Visit Number 3   Number of Visits 17   Authorization Type [employer]   Authorization Time Period 01/04/2016 to 02/29/2016   PT Start Time 1017   PT Stop Time 1042   PT Time Calculation (min) 25 min   Equipment Utilized During Treatment Gait belt   Activity Tolerance Patient limited by fatigue   Behavior During Therapy [facility] for tasks assessed/performed      Past Medical History  Diagnosis Date  . Hypertension   . Diabetes mellitus   . Hyperlipidemia   . Obesity   . Anemia   . Anxiety and depression   . GERD (gastroesophageal reflux disease)   . Pruritus   . Osteoarthritis     Left knee; right shoulder; chronic neck and back pain  . Obstructive sleep apnea   . Urinary incontinence   . Tremor     This started months ago after her seizure progressing to very poor hand writing  . Seizures ([place])   . Depression   . Anxiety   . [id number])     Past Surgical History  Procedure Laterality Date  . Abdominal hysterectomy    . Breast excisional biopsy      Left; cyst  . Cholecystectomy    . Cataract extraction w/ intraocular lens implant Left 09/07/2013  . Eye surgery Left 09/07/2013    cataract  . Colonoscopy    . Colonoscopy N/A 07/20/2015    Procedure: COLONOSCOPY;  Surgeon: Rogene Houston, MD;  [facility];  Service: Endoscopy;  Laterality: N/A;  930    There were no vitals filed for this visit.      Subjective Assessment - 01/20/16 1027    Subjective Pt late for appointment.  Pt states that she doesn't know why she does well one day and bad the next.  States that she feels like she  is going to fall .   Currently in Pain? No/denies                [facility] Adult PT Treatment/Exercise - 01/20/16 0001    Ambulation/Gait   Ambulation/Gait Yes   Ambulation Distance (Feet) 80 Feet   Assistive device Rolling walker   Gait Pattern Decreased stride length;Trunk flexed   Exercises   Exercises Knee/Hip   Knee/Hip Exercises: Standing   Heel Raises Both;10 reps   Functional Squat 2 sets;5 reps   Other Standing Knee Exercises side step at mat x 2RT; standing with no AD x 2 "   Other Standing Knee Exercises marching x 10    Knee/Hip Exercises: Seated   Long Arc Quad Strengthening;Both;10 reps;Weights   Long Arc Quad Weight 4 lbs.   Sit to [employer] 15 reps                  PT Short Term Goals - 01/04/16 1355    PT SHORT TERM GOAL #1   Title Patient will independently verbalize and demo initial HEP in order to improve B LE strength with ambulation and transfers.    Time 2   Period Weeks   Status New   PT SHORT TERM  GOAL #2   Title Patient will independently verbalize 5/5 fall precautions in order to reduce the risk for falls with household ambulation.    Time 3   Period Weeks   Status New   PT SHORT TERM GOAL #3   Title Patient will ambulate indoors on level terrain for 300' x 1 with distant supervision with the use of a FWW with improved heel to toe sequence and cadence in order to improve tolerance and safety with community ambulation.   Time 4   Period Weeks   Status New   PT SHORT TERM GOAL #4   Title Patient will improve her 5 sit to stand time to <25 seconds in order to improve performance and safety with sit<>stand transfers.    Time 4   Period Weeks   Status New           PT Long Term Goals - 01/04/16 1354    PT LONG TERM GOAL #1   Title Patient will independently ambulate indoors and outdoors on level terrain for >350' x 1 with the use of a SPC with improved heel to toe sequence and cadence in order to improve tolerance and safety with  community ambulation.   Time 8   Period Weeks   Status New   PT LONG TERM GOAL #2   Title Patient will improve B hip strength to 4/5 MMT grade in order to improve standing balance and performance with standing ADLs.    Time 8   Period Weeks   Status New   PT LONG TERM GOAL #3   Title Patient will demo improved TUG time to <17 seconds in order to reduce the risk for falls with household ambulation.    Time 8   Period Weeks   Status New   PT LONG TERM GOAL #4   Title Patient will improve her FOTO limitation score to <50% in order to improve her quality of life and independence with ADLs.    Time 8   Period Weeks   Status New   PT LONG TERM GOAL #5   Title Patient will independently verbalize and demo advanced HEP in order to improve B LE strength with ambulation and transfers once DC from PT.    Time 8   Period Weeks   Status New               Plan - 01/20/16 1228    Clinical Impression Statement Pt is late again for appointment.  Urged pt to leave 20 minutes early so that she can get the full benefit of her session.  Pt worked on standing tolerance activity as well as posture.  Pt needed therapist facilitation for all aspects of her treatment for safety.     Clinical Impairments Affecting Rehab Potential Co-morbidities and minimal motivation    PT Frequency 2x / week   PT Duration 8 weeks   PT Treatment/Interventions ADLs/Self Care Home Management;Cryotherapy;Moist Heat;Gait training;DME Instruction;Stair training;Functional mobility training;Therapeutic activities;Therapeutic exercise;Balance training;Neuromuscular re-education;Patient/family education;Passive range of motion;Manual techniques   PT Next Visit Plan continue with gait training with walker.  Instruct in supine exercise pt can to at home then progress to standing tolerance.       Patient will benefit from skilled therapeutic intervention in order to improve the following deficits and impairments:  Abnormal  gait, Decreased strength, Improper body mechanics, Postural dysfunction, Difficulty walking, Decreased mobility, Impaired flexibility, Obesity, Decreased range of motion, Decreased balance, Pain, Decreased endurance  Visit Diagnosis: Difficulty  in walking, not elsewhere classified  Muscle weakness (generalized)  Unsteadiness on feet  Repeated falls     Problem List Patient Active Problem List   Diagnosis Date Noted  . Sleep terror disorder 12/14/2015  . Fecal incontinence 12/13/2015  . Medicare annual wellness visit, subsequent 08/10/2015  . Back pain of lumbar region with sciatica 04/15/2015  . Shoulder pain, right 04/14/2015  . Spondylosis, cervical, with myelopathy 04/14/2015  . Bilateral leg edema 12/13/2014  . Leg swelling 09/06/2014  . Pruritus 09/06/2014  . PVD (peripheral vascular disease) ([place]) 01/28/2014  . OSA (obstructive sleep apnea) 04/10/2013  . Peripheral neuropathy ([place]) 03/12/2013  . Hearing loss 11/06/2012  . UNSTEADY GAIT 11/07/2010  . Microcytic anemia 01/26/2010  . NECK PAIN, CHRONIC 10/21/2008  . Diabetes mellitus, insulin dependent (IDDM), uncontrolled ([place]) 04/21/2008  . Hyperlipemia 04/21/2008  . Morbid obesity ([place]) 04/21/2008  . Anxiety and depression 04/21/2008  . Essential hypertension 04/21/2008  . GERD 04/21/2008  . OSTEOARTHRITIS 04/21/2008  . URINARY INCONTINENCE 04/21/2008    Rayetta Humphrey, PT CLT [phone] 01/20/2016, 12:33 PM  [facility] [address] [place], [place], [address] Phone: [phone]   Fax:  [phone]  Name: Laura Atkins MRN: [id number] Date of Birth: 10-Dec-1940

## 2016-01-22 ENCOUNTER — Ambulatory Visit (INDEPENDENT_AMBULATORY_CARE_PROVIDER_SITE_OTHER): Payer: PPO | Admitting: Neurology

## 2016-01-22 DIAGNOSIS — G4733 Obstructive sleep apnea (adult) (pediatric): Secondary | ICD-10-CM

## 2016-01-22 DIAGNOSIS — Z9989 Dependence on other enabling machines and devices: Secondary | ICD-10-CM

## 2016-01-22 DIAGNOSIS — G4734 Idiopathic sleep related nonobstructive alveolar hypoventilation: Secondary | ICD-10-CM

## 2016-01-22 DIAGNOSIS — G4720 Circadian rhythm sleep disorder, unspecified type: Secondary | ICD-10-CM

## 2016-01-22 DIAGNOSIS — G479 Sleep disorder, unspecified: Secondary | ICD-10-CM

## 2016-01-24 ENCOUNTER — Ambulatory Visit (HOSPITAL_COMMUNITY): Payer: PPO | Attending: Family Medicine | Admitting: Physical Therapy

## 2016-01-24 DIAGNOSIS — M6281 Muscle weakness (generalized): Secondary | ICD-10-CM | POA: Diagnosis not present

## 2016-01-24 DIAGNOSIS — R296 Repeated falls: Secondary | ICD-10-CM | POA: Insufficient documentation

## 2016-01-24 DIAGNOSIS — R2681 Unsteadiness on feet: Secondary | ICD-10-CM | POA: Insufficient documentation

## 2016-01-24 DIAGNOSIS — R262 Difficulty in walking, not elsewhere classified: Secondary | ICD-10-CM | POA: Diagnosis not present

## 2016-01-24 NOTE — Telephone Encounter (Signed)
Patient called regarding results of CPAP Sunday night 01/22/16.

## 2016-01-24 NOTE — Telephone Encounter (Signed)
I spoke to patient and advised her that I have not received the results yet but as soon as I do, I will give her a call back.

## 2016-01-24 NOTE — Therapy (Signed)
[facility] [address] [place], [place], [address] Phone: [phone]   Fax:  [phone]  Physical Therapy Treatment  Patient Details  Name: Laura Atkins MRN: [id number] Date of Birth: [birth year] Referring Provider: Dr. Tula Nakayama  Encounter Date: 01/24/2016      PT End of Session - 01/24/16 1228    Visit Number 4   Number of Visits 17   Date for PT Re-Evaluation 02/03/16   Authorization Type [employer]   Authorization Time Period 01/04/2016 to 02/29/2016   PT Start Time 1042  patient late    PT Stop Time 1115   PT Time Calculation (min) 33 min   Activity Tolerance Patient limited by fatigue   Behavior During Therapy [facility] for tasks assessed/performed      Past Medical History  Diagnosis Date  . Hypertension   . Diabetes mellitus   . Hyperlipidemia   . Obesity   . Anemia   . Anxiety and depression   . GERD (gastroesophageal reflux disease)   . Pruritus   . Osteoarthritis     Left knee; right shoulder; chronic neck and back pain  . Obstructive sleep apnea   . Urinary incontinence   . Tremor     This started months ago after her seizure progressing to very poor hand writing  . Seizures ([place])   . Depression   . Anxiety   . [id number])     Past Surgical History  Procedure Laterality Date  . Abdominal hysterectomy    . Breast excisional biopsy      Left; cyst  . Cholecystectomy    . Cataract extraction w/ intraocular lens implant Left 09/07/2013  . Eye surgery Left 09/07/2013    cataract  . Colonoscopy    . Colonoscopy N/A 07/20/2015    Procedure: COLONOSCOPY;  Surgeon: Rogene Houston, MD;  [facility];  Service: Endoscopy;  Laterality: N/A;  930    There were no vitals filed for this visit.      Subjective Assessment - 01/24/16 1225    Subjective Patient late for appointment. Patient reports she has still been struggling weith some depression and that she just felt sore after  last session. Wants to walk with cane.    Patient Stated Goals Patient's goals include improving her B LE strength and to ambulate with less difficulty    Currently in Pain? No/denies                         [facility] Adult PT Treatment/Exercise - 01/24/16 0001    Ambulation/Gait   Ambulation/Gait Yes   Ambulation/Gait Assistance 4: Min guard   Ambulation Distance (Feet) --  56ft, 60ft   Assistive device Straight cane   Gait Pattern Decreased arm swing - right;Decreased arm swing - left;Decreased step length - right;Decreased step length - left;Decreased trunk rotation;Trunk flexed;Wide base of support;Poor foot clearance - left;Poor foot clearance - right   Knee/Hip Exercises: Seated   Long Arc Quad Strengthening;Both;1 set;10 reps   Long Arc Quad Weight 1 lbs.   Long Arc Quad Limitations 1x10   Other Seated Knee/Hip Exercises Toe raises; seated marches with 1# 1x20; forward reaching exercise 2x5    Marching Both;15 reps   Abduction/Adduction  2 sets;15 reps   Abd/Adduction Limitations red TB                 PT Education - 01/24/16 1228    Education  provided No          PT Short Term Goals - 01/04/16 1355    PT SHORT TERM GOAL #1   Title Patient will independently verbalize and demo initial HEP in order to improve B LE strength with ambulation and transfers.    Time 2   Period Weeks   Status New   PT SHORT TERM GOAL #2   Title Patient will independently verbalize 5/5 fall precautions in order to reduce the risk for falls with household ambulation.    Time 3   Period Weeks   Status New   PT SHORT TERM GOAL #3   Title Patient will ambulate indoors on level terrain for 300' x 1 with distant supervision with the use of a FWW with improved heel to toe sequence and cadence in order to improve tolerance and safety with community ambulation.   Time 4   Period Weeks   Status New   PT SHORT TERM GOAL #4   Title Patient will improve her 5 sit to stand time  to <25 seconds in order to improve performance and safety with sit<>stand transfers.    Time 4   Period Weeks   Status New           PT Long Term Goals - 01/04/16 1354    PT LONG TERM GOAL #1   Title Patient will independently ambulate indoors and outdoors on level terrain for >350' x 1 with the use of a SPC with improved heel to toe sequence and cadence in order to improve tolerance and safety with community ambulation.   Time 8   Period Weeks   Status New   PT LONG TERM GOAL #2   Title Patient will improve B hip strength to 4/5 MMT grade in order to improve standing balance and performance with standing ADLs.    Time 8   Period Weeks   Status New   PT LONG TERM GOAL #3   Title Patient will demo improved TUG time to <17 seconds in order to reduce the risk for falls with household ambulation.    Time 8   Period Weeks   Status New   PT LONG TERM GOAL #4   Title Patient will improve her FOTO limitation score to <50% in order to improve her quality of life and independence with ADLs.    Time 8   Period Weeks   Status New   PT LONG TERM GOAL #5   Title Patient will independently verbalize and demo advanced HEP in order to improve B LE strength with ambulation and transfers once DC from PT.    Time 8   Period Weeks   Status New               Plan - 01/24/16 1229    Clinical Impression Statement Patient arrived late for appointment; very talkative during early part of session however focus improved as session went on. Worked on seated strengthening activities followed by gait training with cane today. Cues to stay on task and for form with exercsies today; patient unsteady during gait with SPC and required min guard for safety. Educated regarding height of cane/walker however patient reports she prefers her walker higher so it doesn't feel like she is beinding over.    Rehab Potential Fair   Clinical Impairments Affecting Rehab Potential Co-morbidities and minimal  motivation    PT Frequency 2x / week   PT Duration 8 weeks   PT Treatment/Interventions ADLs/Self  Care Home Management;Cryotherapy;Moist Heat;Gait training;DME Instruction;Stair training;Functional mobility training;Therapeutic activities;Therapeutic exercise;Balance training;Neuromuscular re-education;Patient/family education;Passive range of motion;Manual techniques   PT Next Visit Plan continue with gait training with walker.  Instruct in supine exercise pt can to at home then progress to standing tolerance.    PT Home Exercise Plan Seated hip strengthening exercises added to HEP including sit to stands, marching, abduction and ankle pumps.   Consulted and Agree with Plan of Care Patient      Patient will benefit from skilled therapeutic intervention in order to improve the following deficits and impairments:  Abnormal gait, Decreased strength, Improper body mechanics, Postural dysfunction, Difficulty walking, Decreased mobility, Impaired flexibility, Obesity, Decreased range of motion, Decreased balance, Pain, Decreased endurance  Visit Diagnosis: Difficulty in walking, not elsewhere classified  Muscle weakness (generalized)  Unsteadiness on feet  Repeated falls     Problem List Patient Active Problem List   Diagnosis Date Noted  . Sleep terror disorder 12/14/2015  . Fecal incontinence 12/13/2015  . Medicare annual wellness visit, subsequent 08/10/2015  . Back pain of lumbar region with sciatica 04/15/2015  . Shoulder pain, right 04/14/2015  . Spondylosis, cervical, with myelopathy 04/14/2015  . Bilateral leg edema 12/13/2014  . Leg swelling 09/06/2014  . Pruritus 09/06/2014  . PVD (peripheral vascular disease) ([place]) 01/28/2014  . OSA (obstructive sleep apnea) 04/10/2013  . Peripheral neuropathy ([place]) 03/12/2013  . Hearing loss 11/06/2012  . UNSTEADY GAIT 11/07/2010  . Microcytic anemia 01/26/2010  . NECK PAIN, CHRONIC 10/21/2008  . Diabetes mellitus, insulin dependent  (IDDM), uncontrolled ([place]) 04/21/2008  . Hyperlipemia 04/21/2008  . Morbid obesity ([place]) 04/21/2008  . Anxiety and depression 04/21/2008  . Essential hypertension 04/21/2008  . GERD 04/21/2008  . OSTEOARTHRITIS 04/21/2008  . URINARY INCONTINENCE 04/21/2008   Deniece Ree PT, DPT [phone]  [facility] [address] [place], [place], [address] Phone: [phone]   Fax:  [phone]  Name: SHATORI LIBERA MRN: [id number] Date of Birth: 1941/06/05

## 2016-01-25 ENCOUNTER — Telehealth: Payer: Self-pay | Admitting: Neurology

## 2016-01-25 DIAGNOSIS — Z9989 Dependence on other enabling machines and devices: Secondary | ICD-10-CM

## 2016-01-25 DIAGNOSIS — G4734 Idiopathic sleep related nonobstructive alveolar hypoventilation: Secondary | ICD-10-CM

## 2016-01-25 DIAGNOSIS — G4733 Obstructive sleep apnea (adult) (pediatric): Secondary | ICD-10-CM

## 2016-01-25 NOTE — Telephone Encounter (Signed)
Tried to call 2x no answer and no vm.

## 2016-01-25 NOTE — Telephone Encounter (Signed)
I spoke to the patient and she is aware of results and recommendations. She is willing to start treatment. I will send orders to [facility]. I will send report to PCP. Patient was able to make f/u appt. I will send her a letter reminding her to keep appt and stress the importance of compliance.

## 2016-01-25 NOTE — Telephone Encounter (Signed)
Pt returned call. Please call back.

## 2016-01-25 NOTE — Telephone Encounter (Signed)
Patient referred by Dr. Moshe Cipro, seen by me on 12/29/15, diagnostic PSG on 01/16/16, cpap study on 01/22/16, ins: HTA/MCR.  Please call and inform patient that I have entered an order for treatment with positive airway pressure (PAP) treatment of obstructive sleep apnea (OSA). She did well during the latest sleep study with BiPAP. We will, therefore, arrange for a machine for home use through a DME (durable medical equipment) company of Her choice; and I will see the patient back in follow-up in about 8-10 weeks. Please also explain to the patient that I will be looking out for compliance data, which can be downloaded from the machine (stored on an SD card, that is inserted in the machine) or via remote access through a modem, that is built into the machine. At the time of the followup appointment we will discuss sleep study results and how it is going with PAP treatment at home. Please advise patient to bring Her machine at the time of the first FU visit, even though this is cumbersome. Bringing the machine for every visit after that will likely not be needed, but often helps for the first visit to troubleshoot if needed. Please re-enforce the importance of compliance with treatment and the need for [place] to monitor compliance data - often an insurance requirement and actually good feedback for the patient as far as how they are doing.  Also remind patient, that any interim PAP machine or mask issues should be first addressed with the DME company, as they can often help better with technical and mask fit issues. Please ask if patient has a preference regarding DME company.  Please also make sure, the patient has a follow-up appointment with me in about 8-10 weeks from the setup date, thanks.  Once you have spoken to the patient - and faxed/routed report to PCP and referring MD (if other than PCP), you can close this encounter, thanks,   Star Age, MD, PhD [facility] ([place])

## 2016-01-26 ENCOUNTER — Ambulatory Visit (HOSPITAL_COMMUNITY): Payer: PPO

## 2016-01-26 ENCOUNTER — Telehealth (HOSPITAL_COMMUNITY): Payer: Self-pay

## 2016-01-26 NOTE — Telephone Encounter (Signed)
She is sick and can not be here.

## 2016-01-31 ENCOUNTER — Telehealth (HOSPITAL_COMMUNITY): Payer: Self-pay | Admitting: Physical Therapy

## 2016-01-31 ENCOUNTER — Encounter (HOSPITAL_COMMUNITY): Payer: Self-pay | Admitting: Physical Therapy

## 2016-01-31 ENCOUNTER — Telehealth: Payer: Self-pay | Admitting: Family Medicine

## 2016-01-31 NOTE — Telephone Encounter (Signed)
She is not feeling well today and will stay in bed. NF

## 2016-01-31 NOTE — Telephone Encounter (Signed)
Patient is asking if Dr. Moshe Cipro could recommend her something to take such as possibly a vitamin, she has had no energy for over a week now and not sure what she needs, please advise?

## 2016-01-31 NOTE — Telephone Encounter (Signed)
Patient did not show up for today's scheduled appointment; called patient, who reported that she had called to cancel earlier, just can't make it in today as she is still not feeling good, not sure what is wrong. Advised patient to see MD if she is not feeling better by the end of the week. Reminded patient regarding time/date of next scheduled session.  Deniece Ree PT, DPT [phone]

## 2016-02-01 NOTE — Telephone Encounter (Signed)
Advised patient to take otc multivitamin daily

## 2016-02-02 ENCOUNTER — Telehealth (HOSPITAL_COMMUNITY): Payer: Self-pay | Admitting: Physical Therapy

## 2016-02-02 ENCOUNTER — Ambulatory Visit (HOSPITAL_COMMUNITY): Payer: PPO | Admitting: Physical Therapy

## 2016-02-02 NOTE — Telephone Encounter (Signed)
Patient a no show for today's session (consecutive no-show #1); called and spoke to patient, who reported that she is still not feeling good. Educated that patient today was her first no-show and explained clinic no-show policy. Reminded patient of time/date of next scheduled appointment. Patient gave verbal understanding and agreement to all education provided today, states that right now she is planning to come to her next scheduled session.   Deniece Ree PT, DPT [phone]

## 2016-02-03 ENCOUNTER — Encounter: Payer: Self-pay | Admitting: Endocrinology

## 2016-02-03 ENCOUNTER — Ambulatory Visit (INDEPENDENT_AMBULATORY_CARE_PROVIDER_SITE_OTHER): Payer: PPO | Admitting: Endocrinology

## 2016-02-03 VITALS — BP 152/64 | HR 85 | Temp 98.1°F | Ht 68.0 in | Wt 301.0 lb

## 2016-02-03 DIAGNOSIS — E1151 Type 2 diabetes mellitus with diabetic peripheral angiopathy without gangrene: Secondary | ICD-10-CM

## 2016-02-03 DIAGNOSIS — Z794 Long term (current) use of insulin: Secondary | ICD-10-CM | POA: Diagnosis not present

## 2016-02-03 DIAGNOSIS — E1065 Type 1 diabetes mellitus with hyperglycemia: Secondary | ICD-10-CM

## 2016-02-03 DIAGNOSIS — E108 Type 1 diabetes mellitus with unspecified complications: Secondary | ICD-10-CM | POA: Diagnosis not present

## 2016-02-03 DIAGNOSIS — IMO0002 Reserved for concepts with insufficient information to code with codable children: Secondary | ICD-10-CM

## 2016-02-03 LAB — GLUCOSE, POCT (MANUAL RESULT ENTRY): POC Glucose: 155 mg/dl — AB (ref 70–99)

## 2016-02-03 LAB — POCT GLYCOSYLATED HEMOGLOBIN (HGB A1C): Hemoglobin A1C: 8.1

## 2016-02-03 NOTE — Patient Instructions (Addendum)
check your blood sugar 4 times a day: before the 3 meals, and at bedtime.  also check if you have symptoms of your blood sugar being too high or too low.  please keep a record of the readings and bring it to your next appointment here.  You can write it on any piece of paper.  please call us sooner if your blood sugar goes below 70, or if you have a lot of readings over 200.  Please continue the insulin, 50 units with breakfast (40 on sundays), and 25 units with supper.  For your safety, take this insulin right before you eat, and only when you eat the first and last meals of the day.  On this type of insulin schedule, you should eat meals on a regular schedule.  If a meal is missed or significantly delayed, your blood sugar could go low.   To help you remember the evening insulin, draw it up when you draw up your breakfast insulin.  It can stay at room temperature.  "Alli" is a safe weight loss medication.  It is non-prescription medication. Please come back for a follow-up appointment in 3 months.

## 2016-02-03 NOTE — Progress Notes (Signed)
Subjective:    Patient ID: Laura Atkins, female    DOB: 04-27-1941, 76 y.o.   MRN: [id number]  HPI Pt returns for f/u of diabetes mellitus: DM type: insulin-requiring type 2.   Dx'ed: [id number] Complications: polyneuropathy, PAD, and foot ulcer.   Therapy: insulin since 2005.   GDM: never.  DKA: never Severe hypoglycemia: never.  Pancreatitis: never.   Other info: in early 2016, she changed to BID premixed insulin, due to poor results with multiple daily injections.  She takes human insulin, due to cost.  Interval history: she brings a record of her cbg's which i have reviewed today.  It varies from 92-300.  There is no trend throughout the day, except it is highest at hs.  She still forgets PM insulin approx twice a week.   Past Medical History  Diagnosis Date  . Hypertension   . Diabetes mellitus   . Hyperlipidemia   . Obesity   . Anemia   . Anxiety and depression   . GERD (gastroesophageal reflux disease)   . Pruritus   . Osteoarthritis     Left knee; right shoulder; chronic neck and back pain  . Obstructive sleep apnea   . Urinary incontinence   . Tremor     This started months ago after her seizure progressing to very poor hand writing  . Seizures ([place])   . Depression   . Anxiety   . [id number])     Past Surgical History  Procedure Laterality Date  . Abdominal hysterectomy    . Breast excisional biopsy      Left; cyst  . Cholecystectomy    . Cataract extraction w/ intraocular lens implant Left 09/07/2013  . Eye surgery Left 09/07/2013    cataract  . Colonoscopy    . Colonoscopy N/A 07/20/2015    Procedure: COLONOSCOPY;  Surgeon: Rogene Houston, MD;  [facility];  Service: Endoscopy;  Laterality: N/A;  930    Social History   Social History  . Marital Status: Married    Spouse Name: saunders  . Number of Children: 5  . Years of Education: 12   Occupational History  . Disabled     Social History Main Topics  . Smoking status: Never  Smoker   . Smokeless tobacco: Never Used  . Alcohol Use: No  . Drug Use: No  . Sexual Activity: Yes    Birth Control/ Protection: Surgical   Other Topics Concern  . Not on file   Social History Narrative   Patient lives at home with her husband [name].  Patient is retired.    Right handed.    Five Children.    Caffeine- 2 daily    Current Outpatient Prescriptions on File Prior to Visit  Medication Sig Dispense Refill  . acetaminophen (EXTRA STRENGTH PAIN RELIEF) 500 MG tablet Take 500 mg by mouth every 6 (six) hours as needed for pain.    Marland Kitchen amLODipine (NORVASC) 10 MG tablet TAKE 1 TABLET BY MOUTH EVERY DAY 90 tablet 0  . aspirin 81 MG tablet Take 81 mg by mouth daily.      Marland Kitchen atorvastatin (LIPITOR) 40 MG tablet TAKE 1 TABLET BY MOUTH EVERY DAY 90 tablet 0  . benazepril (LOTENSIN) 20 MG tablet TAKE 1 TABLET BY MOUTH EVERY DAY 90 tablet 0  . diclofenac sodium (VOLTAREN) 1 % GEL APPLY 4 GRAMS TOPICALLY TO AFFECTED AREA(S) FOUR TIMES DAILY 100 g 1  . diphenhydrAMINE (BENADRYL) 25 mg capsule Take  25 mg by mouth every 6 (six) hours as needed. Reported on 10/18/2015    . gabapentin (NEURONTIN) 400 MG capsule TAKE 1 CAPSULE BY MOUTH TWICE DAILY AND TAKE 2 CAPSULES AT BEDTIME - DOSE INCREASE SB 04/14/15 120 capsule 4  . insulin NPH-regular Human (NOVOLIN 70/30) (70-30) 100 UNIT/ML injection 50 units with breakfast and 25 units with supper, and syringes 2/day. 20 mL 11  . Multiple Vitamins-Minerals (CENTRUM SILVER ULTRA WOMENS PO) Take 1 tablet by mouth daily.     Marland Kitchen omeprazole (PRILOSEC) 20 MG capsule TAKE 1 CAPSULE BY MOUTH DAILY. 90 capsule 0  . ONE TOUCH ULTRA TEST test strip USE TO TEST BLOOD SUGAR FOUR TIMES DAILY 150 each 2  . potassium chloride SA (K-DUR,KLOR-CON) 20 MEQ tablet TAKE 1 TABLET BY MOUTH TWICE DAILY 60 tablet 5  . sertraline (ZOLOFT) 100 MG tablet Take 1 tablet (100 mg total) by mouth 2 (two) times daily. 60 tablet 2  . torsemide (DEMADEX) 20 MG tablet TAKE 2 TABLETS BY  MOUTH TWICE DAILY -STOP LASIX 120 tablet 6   No current facility-administered medications on file prior to visit.    Allergies  Allergen Reactions  . Penicillins Shortness Of Breath, Itching and Rash  . Prednisone Shortness Of Breath, Itching and Rash  . Propoxyphene N-Acetaminophen Itching and Nausea And Vomiting    Family History  Problem Relation Age of Onset  . ADD / ADHD Grandchild   . Bipolar disorder Grandchild   . Bipolar disorder Daughter   . Alcohol abuse Neg Hx   . Drug abuse Neg Hx   . Cancer Mother 67    lung   . Kidney disease Father   . Diabetes Sister     BP 152/64 mmHg  Pulse 85  Temp(Src) 98.1 F (36.7 C) (Oral)  Ht 5\' 8"  (1.727 m)  Wt 301 lb (136.533 kg)  BMI 45.78 kg/m2  SpO2 96%  Review of Systems She denies hypoglycemia.      Objective:   Physical Exam VITAL SIGNS:  See vs page GENERAL: no distress.  Morbid obesity.  Pulses: dorsalis pedis absent bilat (prob due to edema)  MSK: no deformity of the feet.  CV: 2+ bilat leg edema.  Skin: no ulcer on the feet. normal color and temp on the feet.  Neuro: sensation is intact to touch on the feet, but decreased from normal.   [id number] is bilateral onychomycosis of the toenails.     A1c=8.1%    Assessment & Plan:  DM: she needs increased rx Noncompliance with insulin, persistent. Morbid obesity: persistent.     Patient is advised the following: Patient Instructions  check your blood sugar 4 times a day: before the 3 meals, and at bedtime.  also check if you have symptoms of your blood sugar being too high or too low.  please keep a record of the readings and bring it to your next appointment here.  You can write it on any piece of paper.  please call us sooner if your blood sugar goes below 70, or if you have a lot of readings over 200.  Please continue the insulin, 50 units with breakfast (40 on sundays), and 25 units with supper.  For your safety, take this insulin right before you eat,  and only when you eat the first and last meals of the day.  On this type of insulin schedule, you should eat meals on a regular schedule.  If a meal is missed or significantly delayed, your  blood sugar could go low.   To help you remember the evening insulin, draw it up when you draw up your breakfast insulin.  It can stay at room temperature.  "Alli" is a safe weight loss medication.  It is non-prescription medication. Please come back for a follow-up appointment in 3 months.

## 2016-02-04 DIAGNOSIS — E1165 Type 2 diabetes mellitus with hyperglycemia: Secondary | ICD-10-CM | POA: Insufficient documentation

## 2016-02-04 DIAGNOSIS — E1149 Type 2 diabetes mellitus with other diabetic neurological complication: Secondary | ICD-10-CM | POA: Insufficient documentation

## 2016-02-07 ENCOUNTER — Ambulatory Visit (HOSPITAL_COMMUNITY): Payer: PPO

## 2016-02-07 ENCOUNTER — Encounter (HOSPITAL_COMMUNITY): Payer: Self-pay | Admitting: Psychiatry

## 2016-02-07 ENCOUNTER — Ambulatory Visit (INDEPENDENT_AMBULATORY_CARE_PROVIDER_SITE_OTHER): Payer: PPO | Admitting: Psychiatry

## 2016-02-07 VITALS — BP 126/82 | HR 76 | Ht 68.0 in | Wt 304.2 lb

## 2016-02-07 DIAGNOSIS — F329 Major depressive disorder, single episode, unspecified: Secondary | ICD-10-CM

## 2016-02-07 DIAGNOSIS — R2681 Unsteadiness on feet: Secondary | ICD-10-CM

## 2016-02-07 DIAGNOSIS — M6281 Muscle weakness (generalized): Secondary | ICD-10-CM

## 2016-02-07 DIAGNOSIS — F32A Depression, unspecified: Secondary | ICD-10-CM

## 2016-02-07 DIAGNOSIS — R262 Difficulty in walking, not elsewhere classified: Secondary | ICD-10-CM | POA: Diagnosis not present

## 2016-02-07 DIAGNOSIS — R296 Repeated falls: Secondary | ICD-10-CM

## 2016-02-07 MED ORDER — SERTRALINE HCL 100 MG PO TABS: 100.0000 mg | ORAL_TABLET | Freq: Two times a day (BID) | ORAL | Status: DC

## 2016-02-07 NOTE — Patient Instructions (Signed)
I have asked patient to engage in more frequent walking at home prior to additional of more HEP. The goal is to perform 5-6 short additional gait trials throughout the day.

## 2016-02-07 NOTE — Therapy (Signed)
[facility] [address] [place], [place], [address] Phone: [phone]   Fax:  [phone]  Physical Therapy Treatment  Patient Details  Name: Laura Atkins MRN: [id number] Date of Birth: 18-Aug-1941 Referring Provider: Dr. Tula Nakayama   Encounter Date: 02/07/2016      PT End of Session - 02/07/16 1456    Visit Number 5   Number of Visits 17   Date for PT Re-Evaluation 02/29/16   Authorization Type [employer]   Authorization Time Period 01/04/2016 to 02/29/2016   PT Start Time 1032   PT Stop Time 1117   PT Time Calculation (min) 45 min   Equipment Utilized During Treatment Gait belt   Activity Tolerance Patient tolerated treatment well;Patient limited by lethargy   Behavior During Therapy [facility] for tasks assessed/performed      Past Medical History  Diagnosis Date  . Hypertension   . Diabetes mellitus   . Hyperlipidemia   . Obesity   . Anemia   . Anxiety and depression   . GERD (gastroesophageal reflux disease)   . Pruritus   . Osteoarthritis     Left knee; right shoulder; chronic neck and back pain  . Obstructive sleep apnea   . Urinary incontinence   . Tremor     This started months ago after her seizure progressing to very poor hand writing  . Seizures ([place])   . Depression   . Anxiety   . YYFRTMYT(117.3)     Past Surgical History  Procedure Laterality Date  . Abdominal hysterectomy    . Breast excisional biopsy      Left; cyst  . Cholecystectomy    . Cataract extraction w/ intraocular lens implant Left 09/07/2013  . Eye surgery Left 09/07/2013    cataract  . Colonoscopy    . Colonoscopy N/A 07/20/2015    Procedure: COLONOSCOPY;  Surgeon: Rogene Houston, MD;  [facility]: [facility];  Service: Endoscopy;  Laterality: N/A;  930    There were no vitals filed for this visit.      Subjective Assessment - 02/07/16 1044    Subjective Pt reports things have not been going very well. She has been  mostly at home due to not driving and depending on others for transportation. She feels like her left knee is now hurting a lot worse since she started. Her R foot pain is worse today which she relates to fluiid LEE issues, which she is seeing cardiology for.  She has been working on marching in place, TB shoulder ER bilat in neutral sitting, biceps curls with dumbbells.     Pertinent History Tremor, hx of falls, peripheral neuropathy, HTN   How long can you sit comfortably? Unlimited    How long can you stand comfortably? Limited to about 10 minutes for kitchen activities, but mostly uses a barstool in kitchen so she doesn't fall.    How long can you walk comfortably? Very little walking during the week, maybe walking about 165f max at church.    Patient Stated Goals Patient's goals include improving her B LE strength and to ambulate with less difficulty; she has been unable to access the community for shopping.    Currently in Pain? Yes   Pain Score 5    Pain Location Heel   Pain Orientation Right   Pain Descriptors / Indicators Aching   Pain Type Acute pain   Pain Onset In the past 7 days   Aggravating Factors  worse in the evening, not clear why    Pain Relieving Factors Patient is not sure, other than a topic cream that the Doctor has given her.   Multiple Pain Sites No            [facility] PT Assessment - 02/07/16 0001    Assessment   Medical Diagnosis Abnormality of gait and B LE weakness   Referring Provider Dr. Tula Nakayama    Onset Date/Surgical Date 11/23/15   Hand Dominance Right   Next MD Visit end of May 2017   Prior Therapy Yes, for same complaints   Precautions   Precautions Fall   Balance Screen   Has the patient fallen in the past 6 months No   Has the patient had a decrease in activity level because of a fear of falling?  Yes   Is the patient reluctant to leave their home because of a fear of falling?  Yes   [place] Private  residence   Living Arrangements Spouse/significant other   Available Help at Discharge Family   Type of [place] level;Other (Comment)  with ramp to enter    [place] - 2 wheels;Walker - 4 wheels   Prior Function   Level of Independence Independent with basic ADLs;Needs assistance with homemaking   Cognition   Overall Cognitive Status Difficult to assess   Behaviors Other (comment)  somewhat melancholy today, and easily distracted.    Observation/Other Assessments   Focus on Therapeutic Outcomes (FOTO)  60% limited   Sensation   Light Touch --  hx of peripheral neuropathy    ROM / Strength   AROM / PROM / Strength Strength   Strength   Right Hip Flexion 4+/5   Right Hip External Rotation  4+/5   Right Hip Internal Rotation 5/5   Right Hip ADduction 3/5   Left Hip Flexion 3+/5   Left Hip External Rotation 4+/5   Left Hip Internal Rotation 5/5   Left Hip ADduction 3/5   Right Knee Flexion 5/5   Right Knee Extension 5/5   Left Knee Flexion 5/5   Left Knee Extension 5/5   Right Ankle Dorsiflexion 3/5   Right Ankle Plantar Flexion 4/5  seated soleus   Left Ankle Dorsiflexion 4+/5   Left Ankle Plantar Flexion 4/5  seated soleus   Transfers   Transfers Sit to Stand;Stand to Sit;Stand Pivot Transfers   Sit to Stand 5: Supervision   Sit to Stand Details (indicate cue type and reason) Requires BUE on chair and then walker   Five time sit to stand comments  61s   Comments R hip extension weakness noted. Very  dependent on Left side for terminal stance.    Ambulation/Gait   Ambulation/Gait Yes   Ambulation/Gait Assistance 5: Supervision   Ambulation Distance (Feet) 100 Feet   Assistive device Rolling walker   Static Standing Balance   Static Standing Balance -  Activities  Romberg - Eyes Opened;Romberg - Eyes Closed   Static Standing - Comment/# of Minutes --  Unable to reasses; time restrictions.    Standardized Balance Assessment    Standardized Balance Assessment Timed Up and Go Test   Timed Up and Go Test   TUG Comments Unable to reasses; time restrictions.                              PT  Education - 02/07/16 1454    Education provided Yes   Education Details explained how sedentary nature and missed sessions have limited ability to improve progress significantly over the last month.    Person(s) Educated Patient   Methods Explanation;Demonstration   Comprehension Verbalized understanding;Need further instruction;Verbal cues required          PT Short Term Goals - 02/07/16 1509    PT SHORT TERM GOAL #1   Title Patient will independently verbalize and demo initial HEP in order to improve B LE strength with ambulation and transfers.    Time 2   Period Weeks   Status Not Met   PT SHORT TERM GOAL #2   Title Patient will independently verbalize 5/5 fall precautions in order to reduce the risk for falls with household ambulation.    Time 3   Period Weeks   Status Partially Met   PT SHORT TERM GOAL #3   Title Patient will ambulate indoors on level terrain for 300' x 1 with distant supervision with the use of a FWW with improved heel to toe sequence and cadence in order to improve tolerance and safety with community ambulation.   Time 4   Period Weeks   Status Achieved   PT SHORT TERM GOAL #4   Title Patient will improve her 5 sit to stand time to <25 seconds in order to improve performance and safety with sit<>stand transfers.    Time 4   Period Weeks   Status On-going           PT Long Term Goals - 01/04/16 1354    PT LONG TERM GOAL #1   Title Patient will independently ambulate indoors and outdoors on level terrain for >350' x 1 with the use of a SPC with improved heel to toe sequence and cadence in order to improve tolerance and safety with community ambulation.   Time 8   Period Weeks   Status New   PT LONG TERM GOAL #2   Title Patient will improve B hip strength to 4/5 MMT  grade in order to improve standing balance and performance with standing ADLs.    Time 8   Period Weeks   Status New   PT LONG TERM GOAL #3   Title Patient will demo improved TUG time to <17 seconds in order to reduce the risk for falls with household ambulation.    Time 8   Period Weeks   Status New   PT LONG TERM GOAL #4   Title Patient will improve her FOTO limitation score to <50% in order to improve her quality of life and independence with ADLs.    Time 8   Period Weeks   Status New   PT LONG TERM GOAL #5   Title Patient will independently verbalize and demo advanced HEP in order to improve B LE strength with ambulation and transfers once DC from PT.    Time 8   Period Weeks   Status New               Plan - 02/07/16 1458    Clinical Impression Statement Reassessment done: pt arrived late and had many things to say regarding her most recent weeks at home in trying to improve her activity. Objective testing is limited due to time, however patient shows progress toward many goals. Pt strength is improved in some areas as noted in MMT, however has not yet carried over to funcitonal testing (5x STS unchanged). Transfers  continue to demonstrate substantial weakness in hip extention which functionally is more weak on the left and compensation on the Right with quads strenght.    Rehab Potential Fair   Clinical Impairments Affecting Rehab Potential Co-morbidities and minimal motivation    PT Frequency 2x / week   PT Duration 8 weeks   PT Treatment/Interventions ADLs/Self Care Home Management;Cryotherapy;Moist Heat;Gait training;DME Instruction;Stair training;Functional mobility training;Therapeutic activities;Therapeutic exercise;Balance training;Neuromuscular re-education;Patient/family education;Passive range of motion;Manual techniques   PT Next Visit Plan Pt needs an indepth review and set-up of new HEP; she seems somewhat confused, and potentially could have limited attendance  in future.    PT Home Exercise Plan No updates this time. When asked to demonstrate her HEP, it appears vague, and inconsistent with impairments at eval. I've asked the patient to progress ambulation at home.    Recommended Other Services If transportation/compliance/attendacne continue to be limited, consider DC and recommending HHPT for patient. She does not really tolerate more than household distances (<342f)    Consulted and Agree with Plan of Care Patient      Patient will benefit from skilled therapeutic intervention in order to improve the following deficits and impairments:  Abnormal gait, Decreased strength, Improper body mechanics, Postural dysfunction, Difficulty walking, Decreased mobility, Impaired flexibility, Obesity, Decreased range of motion, Decreased balance, Pain, Decreased endurance  Visit Diagnosis: Difficulty in walking, not elsewhere classified  Muscle weakness (generalized)  Unsteadiness on feet  Repeated falls       G-Codes - 0Jun 04, 20171511    Functional Assessment Tool Used Clinical Judgment   Functional Limitation Mobility: Walking and moving around   Mobility: Walking and Moving Around Current Status [phone] At least 60 percent but less than 80 percent impaired, limited or restricted   Mobility: Walking and Moving Around Goal Status ([phone] At least 40 percent but less than 60 percent impaired, limited or restricted      Problem List Patient Active Problem List   Diagnosis Date Noted  . Diabetes ([place] 02/04/2016  . Sleep terror disorder 12/14/2015  . Fecal incontinence 12/13/2015  . Medicare annual wellness visit, subsequent 08/10/2015  . Back pain of lumbar region with sciatica 04/15/2015  . Shoulder pain, right 04/14/2015  . Spondylosis, cervical, with myelopathy 04/14/2015  . Bilateral leg edema 12/13/2014  . Leg swelling 09/06/2014  . Pruritus 09/06/2014  . PVD (peripheral vascular disease) ([place] 01/28/2014  . OSA (obstructive sleep apnea)  04/10/2013  . Peripheral neuropathy ([place] 03/12/2013  . Hearing loss 11/06/2012  . UNSTEADY GAIT 11/07/2010  . Microcytic anemia 01/26/2010  . NECK PAIN, CHRONIC 10/21/2008  . Hyperlipemia 04/21/2008  . Morbid obesity ([place] 04/21/2008  . Anxiety and depression 04/21/2008  . Essential hypertension 04/21/2008  . GERD 04/21/2008  . OSTEOARTHRITIS 04/21/2008  . URINARY INCONTINENCE 04/21/2008    3:13 PM, 006/04/2017AEtta Grandchild PT, DPT PRN [facility]rapist [facility]# [phone](office)     [place] [address]Phone: [phone]  Fax:  [phone] Name: Laura REVELSMRN: [id number] of Birth: 117-May-1942
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
## 2016-02-09 ENCOUNTER — Ambulatory Visit (HOSPITAL_COMMUNITY): Payer: PPO

## 2016-02-13 ENCOUNTER — Other Ambulatory Visit: Payer: Self-pay | Admitting: Family Medicine

## 2016-02-13 ENCOUNTER — Other Ambulatory Visit: Payer: Self-pay | Admitting: Cardiology

## 2016-02-14 ENCOUNTER — Ambulatory Visit (HOSPITAL_COMMUNITY): Payer: PPO

## 2016-02-14 DIAGNOSIS — R262 Difficulty in walking, not elsewhere classified: Secondary | ICD-10-CM | POA: Diagnosis not present

## 2016-02-14 DIAGNOSIS — R296 Repeated falls: Secondary | ICD-10-CM

## 2016-02-14 DIAGNOSIS — M6281 Muscle weakness (generalized): Secondary | ICD-10-CM

## 2016-02-14 DIAGNOSIS — R2681 Unsteadiness on feet: Secondary | ICD-10-CM

## 2016-02-14 NOTE — Therapy (Addendum)
[facility] [address] [place], [place], [address] Phone: [phone]   Fax:  [phone]  Physical Therapy Treatment  Patient Details  Name: Laura Atkins MRN: [id number] Date of Birth: 1941/08/27 Referring Provider: Dr. Tula Nakayama   Encounter Date: 02/14/2016      PT End of Session - 02/14/16 1118    Visit Number 6   Number of Visits 17   Date for PT Re-Evaluation 02/29/16   Authorization Type [employer]   Authorization Time Period 01/04/2016 to 02/29/2016   PT Start Time 1026   PT Stop Time 1118   PT Time Calculation (min) 52 min   Equipment Utilized During Treatment Gait belt   Activity Tolerance Patient tolerated treatment well;Patient limited by fatigue   Behavior During Therapy [facility] for tasks assessed/performed      Past Medical History  Diagnosis Date  . Hypertension   . Diabetes mellitus   . Hyperlipidemia   . Obesity   . Anemia   . Anxiety and depression   . GERD (gastroesophageal reflux disease)   . Pruritus   . Osteoarthritis     Left knee; right shoulder; chronic neck and back pain  . Obstructive sleep apnea   . Urinary incontinence   . Tremor     This started months ago after her seizure progressing to very poor hand writing  . Seizures ([place])   . Depression   . Anxiety   . MAUQJFHL(456.2)     Past Surgical History  Procedure Laterality Date  . Abdominal hysterectomy    . Breast excisional biopsy      Left; cyst  . Cholecystectomy    . Cataract extraction w/ intraocular lens implant Left 09/07/2013  . Eye surgery Left 09/07/2013    cataract  . Colonoscopy    . Colonoscopy N/A 07/20/2015    Procedure: COLONOSCOPY;  Surgeon: Rogene Houston, MD;  Location: [facility];  Service: Endoscopy;  Laterality: N/A;  930    There were no vitals filed for this visit.      Subjective Assessment - 02/14/16 1030    Subjective No reports of pain today, did state difficutly breathing at  entrance.  Reports going to [facility] 2-3 times a week doing walking and leg machines.   Pertinent History Tremor, hx of falls, peripheral neuropathy, HTN   Patient Stated Goals Patient's goals include improving her B LE strength and to ambulate with less difficulty; she has been unable to access the community for shopping.    Currently in Pain? No/denies                         [facility] Adult PT Treatment/Exercise - 02/14/16 0001    Ambulation/Gait   Ambulation/Gait Yes   Ambulation/Gait Assistance 5: Supervision   Ambulation Distance (Feet) 450 Feet   Assistive device Rolling walker   Gait Comments Cueing for heel to toe, increase stride length and energy conservation techniuqes.  Reviewed house hold safety gait including removing rugs, night lights, continuing with AD...   Knee/Hip Exercises: Standing   Heel Raises Both;10 reps   Heel Raises Limitations Toe raises   Hip Abduction 10 reps   Wall Squat 10 reps;3 seconds   Wall Squat Limitations minisquat   Other Standing Knee Exercises marching x 10    Knee/Hip Exercises: Seated   Sit to Sand 2 sets;5 reps;without UE support  with airex increased height cueing for no UE A  with min A                   PT Short Term Goals - 02/07/16 1509    PT SHORT TERM GOAL #1   Title Patient will independently verbalize and demo initial HEP in order to improve B LE strength with ambulation and transfers.    Time 2   Period Weeks   Status Not Met   PT SHORT TERM GOAL #2   Title Patient will independently verbalize 5/5 fall precautions in order to reduce the risk for falls with household ambulation.    Time 3   Period Weeks   Status Partially Met   PT SHORT TERM GOAL #3   Title Patient will ambulate indoors on level terrain for 300' x 1 with distant supervision with the use of a FWW with improved heel to toe sequence and cadence in order to improve tolerance and safety with community ambulation.   Time 4   Period Weeks    Status Achieved   PT SHORT TERM GOAL #4   Title Patient will improve her 5 sit to stand time to <25 seconds in order to improve performance and safety with sit<>stand transfers.    Time 4   Period Weeks   Status On-going           PT Long Term Goals - 01/04/16 1354    PT LONG TERM GOAL #1   Title Patient will independently ambulate indoors and outdoors on level terrain for >350' x 1 with the use of a SPC with improved heel to toe sequence and cadence in order to improve tolerance and safety with community ambulation.   Time 8   Period Weeks   Status New   PT LONG TERM GOAL #2   Title Patient will improve B hip strength to 4/5 MMT grade in order to improve standing balance and performance with standing ADLs.    Time 8   Period Weeks   Status New   PT LONG TERM GOAL #3   Title Patient will demo improved TUG time to <17 seconds in order to reduce the risk for falls with household ambulation.    Time 8   Period Weeks   Status New   PT LONG TERM GOAL #4   Title Patient will improve her FOTO limitation score to <50% in order to improve her quality of life and independence with ADLs.    Time 8   Period Weeks   Status New   PT LONG TERM GOAL #5   Title Patient will independently verbalize and demo advanced HEP in order to improve B LE strength with ambulation and transfers once DC from PT.    Time 8   Period Weeks   Status New               Plan - 02/14/16 1128    Clinical Impression Statement O2 sat taken initially and through out session following reports of difficulty breathing with range from 96-98%.  Session focus on LE strengthening and improving gait mechanics to improve activity tolerance.  Pt demonstrates weak proximal musculature with elevated height and min A required with STS.  Added wall squats for gluteal strengthening and CKC activities for proximal musculature.   Rest breaks required through session due to limited by fatigue with activites.  Pt able to  complete 452 feet during gait training with decreased cadence due to fatigue and increased cueing to improve heel to toe pattern and increase  stride length to improve gait mechanics and energy conservation.  Discussed risk falls and encouraged pt to continue ambulating with AD, remove throw rugs and install night lights to reduce risk of falls.  End of session pt limited by fatigue, no reports of pain.     Rehab Potential Fair   Clinical Impairments Affecting Rehab Potential Co-morbidities and minimal motivation    PT Frequency 2x / week   PT Duration 8 weeks   PT Treatment/Interventions ADLs/Self Care Home Management;Cryotherapy;Moist Heat;Gait training;DME Instruction;Stair training;Functional mobility training;Therapeutic activities;Therapeutic exercise;Balance training;Neuromuscular re-education;Patient/family education;Passive range of motion;Manual techniques   PT Next Visit Plan Continue with current PT POC, next session review HEP and give update as appropriate.     PT Home Exercise Plan No updates this time. When asked to demonstrate her HEP, it appears vague, and inconsistent with impairments at eval. I've asked the patient to progress ambulation at home.       Patient will benefit from skilled therapeutic intervention in order to improve the following deficits and impairments:  Abnormal gait, Decreased strength, Improper body mechanics, Postural dysfunction, Difficulty walking, Decreased mobility, Impaired flexibility, Obesity, Decreased range of motion, Decreased balance, Pain, Decreased endurance  Visit Diagnosis: Difficulty in walking, not elsewhere classified  Muscle weakness (generalized)  Unsteadiness on feet  Repeated falls     Problem List Patient Active Problem List   Diagnosis Date Noted  . Diabetes ([place]) 02/04/2016  . Sleep terror disorder 12/14/2015  . Fecal incontinence 12/13/2015  . Medicare annual wellness visit, subsequent 08/10/2015  . Back pain of lumbar  region with sciatica 04/15/2015  . Shoulder pain, right 04/14/2015  . Spondylosis, cervical, with myelopathy 04/14/2015  . Bilateral leg edema 12/13/2014  . Leg swelling 09/06/2014  . Pruritus 09/06/2014  . PVD (peripheral vascular disease) ([place]) 01/28/2014  . OSA (obstructive sleep apnea) 04/10/2013  . Peripheral neuropathy ([place]) 03/12/2013  . Hearing loss 11/06/2012  . UNSTEADY GAIT 11/07/2010  . Microcytic anemia 01/26/2010  . NECK PAIN, CHRONIC 10/21/2008  . Hyperlipemia 04/21/2008  . Morbid obesity ([place]) 04/21/2008  . Anxiety and depression 04/21/2008  . Essential hypertension 04/21/2008  . GERD 04/21/2008  . OSTEOARTHRITIS 04/21/2008  . URINARY INCONTINENCE 04/21/2008   Ihor Austin, [place]; [place] Aldona Lento 02/14/2016, 2:58 PM  [facility] [address] [place], [place], [address] Phone: [phone]   Fax:  [phone]  Name: Laura Atkins MRN: [id number] Date of Birth: 09-16-1941

## 2016-02-15 ENCOUNTER — Encounter (INDEPENDENT_AMBULATORY_CARE_PROVIDER_SITE_OTHER): Payer: PPO | Admitting: Ophthalmology

## 2016-02-16 ENCOUNTER — Ambulatory Visit (HOSPITAL_COMMUNITY): Payer: PPO | Admitting: Physical Therapy

## 2016-02-16 ENCOUNTER — Telehealth (HOSPITAL_COMMUNITY): Payer: Self-pay | Admitting: Physical Therapy

## 2016-02-16 NOTE — Telephone Encounter (Signed)
Spoke with pt regarding missed apt this AM. She thought her apt was tomorrow. Therapist reminded pt that her next apt is 02/21/16 at 10:30 and pt confirmed she will be there.    6:27 PM,02/16/2016 Elly Modena PT, [facility] [phone]

## 2016-02-17 DIAGNOSIS — G4733 Obstructive sleep apnea (adult) (pediatric): Secondary | ICD-10-CM | POA: Diagnosis not present

## 2016-02-21 ENCOUNTER — Ambulatory Visit (INDEPENDENT_AMBULATORY_CARE_PROVIDER_SITE_OTHER): Payer: PPO | Admitting: Family Medicine

## 2016-02-21 ENCOUNTER — Encounter: Payer: Self-pay | Admitting: Family Medicine

## 2016-02-21 ENCOUNTER — Other Ambulatory Visit: Payer: Self-pay | Admitting: Family Medicine

## 2016-02-21 ENCOUNTER — Ambulatory Visit (HOSPITAL_COMMUNITY): Payer: PPO

## 2016-02-21 VITALS — BP 130/64 | HR 91 | Resp 16 | Ht 68.0 in | Wt 300.0 lb

## 2016-02-21 DIAGNOSIS — M150 Primary generalized (osteo)arthritis: Secondary | ICD-10-CM

## 2016-02-21 DIAGNOSIS — F329 Major depressive disorder, single episode, unspecified: Secondary | ICD-10-CM

## 2016-02-21 DIAGNOSIS — R2681 Unsteadiness on feet: Secondary | ICD-10-CM

## 2016-02-21 DIAGNOSIS — M6281 Muscle weakness (generalized): Secondary | ICD-10-CM

## 2016-02-21 DIAGNOSIS — M159 Polyosteoarthritis, unspecified: Secondary | ICD-10-CM

## 2016-02-21 DIAGNOSIS — R32 Unspecified urinary incontinence: Secondary | ICD-10-CM

## 2016-02-21 DIAGNOSIS — F32A Depression, unspecified: Secondary | ICD-10-CM

## 2016-02-21 DIAGNOSIS — F418 Other specified anxiety disorders: Secondary | ICD-10-CM

## 2016-02-21 DIAGNOSIS — K219 Gastro-esophageal reflux disease without esophagitis: Secondary | ICD-10-CM

## 2016-02-21 DIAGNOSIS — Z1231 Encounter for screening mammogram for malignant neoplasm of breast: Secondary | ICD-10-CM

## 2016-02-21 DIAGNOSIS — E785 Hyperlipidemia, unspecified: Secondary | ICD-10-CM | POA: Diagnosis not present

## 2016-02-21 DIAGNOSIS — E559 Vitamin D deficiency, unspecified: Secondary | ICD-10-CM | POA: Diagnosis not present

## 2016-02-21 DIAGNOSIS — I10 Essential (primary) hypertension: Secondary | ICD-10-CM

## 2016-02-21 DIAGNOSIS — F419 Anxiety disorder, unspecified: Secondary | ICD-10-CM

## 2016-02-21 DIAGNOSIS — D509 Iron deficiency anemia, unspecified: Secondary | ICD-10-CM

## 2016-02-21 DIAGNOSIS — M8949 Other hypertrophic osteoarthropathy, multiple sites: Secondary | ICD-10-CM

## 2016-02-21 DIAGNOSIS — R262 Difficulty in walking, not elsewhere classified: Secondary | ICD-10-CM

## 2016-02-21 DIAGNOSIS — D539 Nutritional anemia, unspecified: Secondary | ICD-10-CM | POA: Diagnosis not present

## 2016-02-21 DIAGNOSIS — R296 Repeated falls: Secondary | ICD-10-CM

## 2016-02-21 LAB — COMPLETE METABOLIC PANEL WITH GFR
ALT: 22 U/L (ref 6–29)
AST: 19 U/L (ref 10–35)
Albumin: 4.3 g/dL (ref 3.6–5.1)
Alkaline Phosphatase: 109 U/L (ref 33–130)
BUN: 28 mg/dL — ABNORMAL HIGH (ref 7–25)
CO2: 30 mmol/L (ref 20–31)
Calcium: 9.4 mg/dL (ref 8.6–10.4)
Chloride: 98 mmol/L (ref 98–110)
Creat: 1.1 mg/dL — ABNORMAL HIGH (ref 0.60–0.93)
GFR, Est African American: 58 mL/min — ABNORMAL LOW (ref >=60)
GFR, Est Non African American: 50 mL/min — ABNORMAL LOW (ref >=60)
Glucose, Bld: 218 mg/dL — ABNORMAL HIGH (ref 65–99)
Potassium: 4.4 mmol/L (ref 3.5–5.3)
Sodium: 139 mmol/L (ref 135–146)
Total Bilirubin: 0.4 mg/dL (ref 0.2–1.2)
Total Protein: 7.5 g/dL (ref 6.1–8.1)

## 2016-02-21 LAB — CBC
HCT: 31.4 % — ABNORMAL LOW (ref 35.0–45.0)
Hemoglobin: 9.4 g/dL — ABNORMAL LOW (ref 11.7–15.5)
MCH: 20.1 pg — ABNORMAL LOW (ref 27.0–33.0)
MCHC: 29.9 g/dL — ABNORMAL LOW (ref 32.0–36.0)
MCV: 67.2 fL — ABNORMAL LOW (ref 80.0–100.0)
Platelets: 210 10*3/uL (ref 140–400)
RBC: 4.67 MIL/uL (ref 3.80–5.10)
RDW: 16.2 % — ABNORMAL HIGH (ref 11.0–15.0)
WBC: 6.4 10*3/uL (ref 3.8–10.8)

## 2016-02-21 LAB — LIPID PANEL
Cholesterol: 165 mg/dL (ref 125–200)
HDL: 76 mg/dL (ref >=46)
LDL Cholesterol: 64 mg/dL (ref <130)
Total CHOL/HDL Ratio: 2.2 Ratio (ref <=5.0)
Triglycerides: 123 mg/dL (ref <150)
VLDL: 25 mg/dL (ref <30)

## 2016-02-21 LAB — TSH: TSH: 2.13 mIU/L

## 2016-02-21 NOTE — Therapy (Signed)
[facility] [address] [place], [place], [address] Phone: [phone]   Fax:  [phone]  Physical Therapy Treatment  Patient Details  Name: Laura Atkins MRN: [id number] Date of Birth: 08/03/1941 Referring Provider: Dr. Tula Nakayama   Encounter Date: 02/21/2016      PT End of Session - 02/21/16 1051    Visit Number 7   Number of Visits 17   Date for PT Re-Evaluation 02/29/16   Authorization Type [employer]   Authorization Time Period 01/04/2016 to 02/29/2016   PT Start Time 1048  pt. late for apt   PT Stop Time 1114   PT Time Calculation (min) 26 min   Equipment Utilized During Treatment Gait belt   Activity Tolerance Patient tolerated treatment well;Patient limited by fatigue   Behavior During Therapy [facility] for tasks assessed/performed      Past Medical History  Diagnosis Date  . Hypertension   . Diabetes mellitus   . Hyperlipidemia   . Obesity   . Anemia   . Anxiety and depression   . GERD (gastroesophageal reflux disease)   . Pruritus   . Osteoarthritis     Left knee; right shoulder; chronic neck and back pain  . Obstructive sleep apnea   . Urinary incontinence   . Tremor     This started months ago after her seizure progressing to very poor hand writing  . Seizures ([place])   . Depression   . Anxiety   . FXTKWIOX(735.3)     Past Surgical History  Procedure Laterality Date  . Abdominal hysterectomy    . Breast excisional biopsy      Left; cyst  . Cholecystectomy    . Cataract extraction w/ intraocular lens implant Left 09/07/2013  . Eye surgery Left 09/07/2013    cataract  . Colonoscopy    . Colonoscopy N/A 07/20/2015    Procedure: COLONOSCOPY;  Surgeon: Rogene Houston, MD;  Location[facility];  Service: Endoscopy;  Laterality: N/A;  930    There were no vitals filed for this visit.      Subjective Assessment - 02/21/16 1050    Subjective Pt late for apt.  No reports of pain today,  reports compliance with HEP daily    Pertinent History Tremor, hx of falls, peripheral neuropathy, HTN   Patient Stated Goals Patient's goals include improving her B LE strength and to ambulate with less difficulty; she has been unable to access the community for shopping.    Currently in Pain? No/denies              [facility] Adult PT Treatment/Exercise - 02/21/16 0001    Knee/Hip Exercises: Standing   Heel Raises Both;10 reps   Heel Raises Limitations Toe raises   Hip Abduction 10 reps   Other Standing Knee Exercises marching x 10    Knee/Hip Exercises: Seated   Long Arc Quad Both;15 reps;Weights   Long Arc Quad Weight 3 lbs.   Marching Both;15 reps   Sit to Sand 2 sets;5 reps;without UE support  on chair with min A, tactile and verbal cueing required           PT Short Term Goals - 02/07/16 1509    PT SHORT TERM GOAL #1   Title Patient will independently verbalize and demo initial HEP in order to improve B LE strength with ambulation and transfers.    Time 2   Period Weeks   Status Not Met  PT SHORT TERM GOAL #2   Title Patient will independently verbalize 5/5 fall precautions in order to reduce the risk for falls with household ambulation.    Time 3   Period Weeks   Status Partially Met   PT SHORT TERM GOAL #3   Title Patient will ambulate indoors on level terrain for 300' x 1 with distant supervision with the use of a FWW with improved heel to toe sequence and cadence in order to improve tolerance and safety with community ambulation.   Time 4   Period Weeks   Status Achieved   PT SHORT TERM GOAL #4   Title Patient will improve her 5 sit to stand time to <25 seconds in order to improve performance and safety with sit<>stand transfers.    Time 4   Period Weeks   Status On-going           PT Long Term Goals - 01/04/16 1354    PT LONG TERM GOAL #1   Title Patient will independently ambulate indoors and outdoors on level terrain for >350' x 1 with the use of a  SPC with improved heel to toe sequence and cadence in order to improve tolerance and safety with community ambulation.   Time 8   Period Weeks   Status New   PT LONG TERM GOAL #2   Title Patient will improve B hip strength to 4/5 MMT grade in order to improve standing balance and performance with standing ADLs.    Time 8   Period Weeks   Status New   PT LONG TERM GOAL #3   Title Patient will demo improved TUG time to <17 seconds in order to reduce the risk for falls with household ambulation.    Time 8   Period Weeks   Status New   PT LONG TERM GOAL #4   Title Patient will improve her FOTO limitation score to <50% in order to improve her quality of life and independence with ADLs.    Time 8   Period Weeks   Status New   PT LONG TERM GOAL #5   Title Patient will independently verbalize and demo advanced HEP in order to improve B LE strength with ambulation and transfers once DC from PT.    Time 8   Period Weeks   Status New               Plan - 02/21/16 1246    Clinical Impression Statement Pt late for apt today.  SpO2 monitored through session with range from 93-96%, no reports of SOB through session.  Session focus on LE strengthening to improve functional tasks.  Min A and cueing for form with sit to stands, able to complete from chair height this session with min A.  Pt limited by fatigue at end of session, no reports of pain.  Reviewed HEP exercises and frequency, encouraged to increase frequency for maximal benefits.     Rehab Potential Fair   Clinical Impairments Affecting Rehab Potential Co-morbidities and minimal motivation    PT Frequency 2x / week   PT Duration 8 weeks   PT Treatment/Interventions ADLs/Self Care Home Management;Cryotherapy;Moist Heat;Gait training;DME Instruction;Stair training;Functional mobility training;Therapeutic activities;Therapeutic exercise;Balance training;Neuromuscular re-education;Patient/family education;Passive range of motion;Manual  techniques   PT Next Visit Plan Continue with current PT POC, next session review HEP and give update as appropriate.        Patient will benefit from skilled therapeutic intervention in order to improve the following deficits  and impairments:  Abnormal gait, Decreased strength, Improper body mechanics, Postural dysfunction, Difficulty walking, Decreased mobility, Impaired flexibility, Obesity, Decreased range of motion, Decreased balance, Pain, Decreased endurance  Visit Diagnosis: Difficulty in walking, not elsewhere classified  Muscle weakness (generalized)  Unsteadiness on feet  Repeated falls     Problem List Patient Active Problem List   Diagnosis Date Noted  . Diabetes ([place]) 02/04/2016  . Sleep terror disorder 12/14/2015  . Fecal incontinence 12/13/2015  . Medicare annual wellness visit, subsequent 08/10/2015  . Back pain of lumbar region with sciatica 04/15/2015  . Shoulder pain, right 04/14/2015  . Spondylosis, cervical, with myelopathy 04/14/2015  . Bilateral leg edema 12/13/2014  . Leg swelling 09/06/2014  . Pruritus 09/06/2014  . PVD (peripheral vascular disease) ([place]) 01/28/2014  . OSA (obstructive sleep apnea) 04/10/2013  . Peripheral neuropathy ([place]) 03/12/2013  . Hearing loss 11/06/2012  . UNSTEADY GAIT 11/07/2010  . Microcytic anemia 01/26/2010  . NECK PAIN, CHRONIC 10/21/2008  . Hyperlipemia 04/21/2008  . Morbid obesity ([place]) 04/21/2008  . Anxiety and depression 04/21/2008  . Essential hypertension 04/21/2008  . GERD 04/21/2008  . OSTEOARTHRITIS 04/21/2008  . URINARY INCONTINENCE 04/21/2008   Ihor Austin, [place]; [place]  Aldona Lento 02/21/2016, 12:54 PM  [facility] [address] [place], [place], [address] Phone: [phone]   Fax:  [phone]  Name: Laura Atkins MRN: [id number] Date of Birth: 1941/01/29

## 2016-02-21 NOTE — Patient Instructions (Addendum)
F/u in 3 months, call if you need me sooner  PLS bring all medication to next visit and take as prescribed  Alli is safe and a good option to help with weight loss Labs today, Mammogram needed, pls get appointment at checkout   Pls decide on LESS TV and getting  To bed by 11 pm so that you can go to the [employer] with your friends on a REGULAR basis.  Foot exam shows , no sore, just poor circulation and neuropathy.  pls schedule eye exam this is past due  Thank you  for choosing [facility]. We consider it a privelige to serve you.  Delivering excellent health care in a caring and  compassionate way is our goal.  Partnering with you,  so that together we can achieve this goal is our strategy.

## 2016-02-22 LAB — VITAMIN D 25 HYDROXY (VIT D DEFICIENCY, FRACTURES): Vit D, 25-Hydroxy: 23 ng/mL — ABNORMAL LOW (ref 30–100)

## 2016-02-22 LAB — IRON AND TIBC
%SAT: 14 % (ref 11–50)
Iron: 50 ug/dL (ref 45–160)
TIBC: 346 ug/dL (ref 250–450)
UIBC: 296 ug/dL (ref 125–400)

## 2016-02-22 LAB — FERRITIN: Ferritin: 76 ng/mL (ref 20–288)

## 2016-02-22 LAB — VITAMIN B12: Vitamin B-12: 770 pg/mL (ref 200–1100)

## 2016-02-23 ENCOUNTER — Ambulatory Visit (HOSPITAL_COMMUNITY): Payer: PPO | Admitting: Physical Therapy

## 2016-02-23 ENCOUNTER — Telehealth (HOSPITAL_COMMUNITY): Payer: Self-pay | Admitting: Physical Therapy

## 2016-02-23 NOTE — Assessment & Plan Note (Signed)
Laura Atkins is reminded of the importance of commitment to daily physical activity for 30 minutes or more, as able and the need to limit carbohydrate intake to 30 to 60 grams per meal to help with blood sugar control.   The need to take medication as prescribed, test blood sugar as directed, and to call between visits if there is a concern that blood sugar is uncontrolled is also discussed.   Laura Atkins is reminded of the importance of daily foot exam, annual eye examination, and good blood sugar, blood pressure and cholesterol control. Followed by endo, slight improvement but still not at goal  Diabetic Labs Latest Ref Rng 02/21/2016 02/03/2016 11/04/2015 10/18/2015 08/04/2015  HbA1c - - 8.1 8.2 - -  Microalbumin <2.0 mg/dL - - - - -  Micro/Creat Ratio 0.0 - 30.0 mg/g - - - - -  Chol 125 - 200 mg/dL 165 - - - -  HDL >=46 mg/dL 76 - - - -  Calc LDL <130 mg/dL 64 - - - -  Triglycerides <150 mg/dL 123 - - - -  Creatinine 0.60 - 0.93 mg/dL 1.10(H) - - 1.08(H) 1.10(H)   BP/Weight 02/21/2016 02/03/2016 12/29/2015 12/28/2015 12/12/2015 11/04/2015 [id number]  Systolic BP [id number] 123456 [id number] 123456 [id number]  Diastolic BP 64 64 62 60 66 70 53  Wt. (Lbs) 300 301 295 295.1 298 297 292  BMI 45.63 45.78 44.86 44.88 46.66 46.51 45.72  Some encounter information is confidential and restricted. Go to Review Flowsheets activity to see all data.   Foot/eye exam completion dates 02/03/2016 07/29/2015  Foot Form Completion Done Done

## 2016-02-23 NOTE — Assessment & Plan Note (Signed)
Controlled, no change in medication DASH diet and commitment to daily physical activity for a minimum of 30 minutes discussed and encouraged, as a part of hypertension management. The importance of attaining a healthy weight is also discussed.  BP/Weight 02/21/2016 02/03/2016 12/29/2015 12/28/2015 12/12/2015 11/04/2015 [id number]  Systolic BP [id number] 0000000 123456 [id number] 123456 [id number]  Diastolic BP 64 64 62 60 66 70 53  Wt. (Lbs) 300 301 295 295.1 298 297 292  BMI 45.63 45.78 44.86 44.88 46.66 46.51 45.72  Some encounter information is confidential and restricted. Go to Review Flowsheets activity to see all data.

## 2016-02-23 NOTE — Telephone Encounter (Signed)
Patient a no-show for today's session. Called and spoke to patient, who said she was just not feeling good today and that she had tried to call but could not as she had lost the number to the clinic. Gave patient the number to PT clinic's front desk and reminded patient of time/date of next scheduled session.  Deniece Ree PT, DPT [phone]

## 2016-02-23 NOTE — Assessment & Plan Note (Signed)
Followed by psychiatry and improved

## 2016-02-23 NOTE — Assessment & Plan Note (Signed)
High fall risk, home safety reviewed

## 2016-02-23 NOTE — Assessment & Plan Note (Signed)
Deteriorated. Patient re-educated about  the importance of commitment to a  minimum of 150 minutes of exercise per week.  The importance of healthy food choices with portion control discussed. Encouraged to start a food diary, count calories and to consider  joining a support group. Sample diet sheets offered. Goals set by the patient for the next several months.   Weight /BMI 02/21/2016 02/03/2016 12/29/2015  WEIGHT 300 lb 301 lb 295 lb  HEIGHT 5\' 8"  5\' 8"  5\' 8"   BMI 45.63 kg/m2 45.78 kg/m2 44.86 kg/m2  Some encounter information is confidential and restricted. Go to Review Flowsheets activity to see all data.

## 2016-02-23 NOTE — Progress Notes (Signed)
Subjective:    Patient ID: Laura Atkins, female    DOB: [birth year], 75 y.o.   MRN: [id number]  HPI   Laura Atkins     MRN: [id number]      DOB: [birth year]   HPI Laura Atkins is here for follow up and re-evaluation of chronic medical conditions, medication management and review of any available recent lab and radiology data.  Preventive health is updated, specifically  Cancer screening and Immunization.   Questions or concerns regarding consultations or procedures which the PT has had in the interim are  addressed. The PT denies any adverse reactions to current medications since the last visit.  C/o no energy, and excessive daytime sleepiness, she is awake all night watching tV into the early morning and barely leaves her bedroom per spouse's report. ROS Denies recent fever or chills. Denies sinus pressure, nasal congestion, ear pain or sore throat. Denies chest congestion, productive cough or wheezing. Denies chest pains, palpitations and leg swelling Denies abdominal pain, nausea, vomiting,diarrhea or constipation.   Denies dysuria, frequency, hesitancy or incontinence. C/o  joint pain, swelling and limitation in mobility. Denies uncontrolled  depression, anxiety or insomnia. Denies skin break down or rash.   PE  BP 130/64 mmHg  Pulse 91  Resp 16  Ht 5\' 8"  (1.727 m)  Wt 300 lb (136.079 kg)  BMI 45.63 kg/m2  SpO2 94%  Patient alert and oriented and in no cardiopulmonary distress.  HEENT: No facial asymmetry, EOMI,   oropharynx pink and moist.  Neck decreased ROM, no jVD  Chest: Clear to auscultation bilaterally.  CVS: S1, S2 no murmurs, no S3.Regular rate.  ABD: Soft non tender.   Ext: No edema  MS: decreased  ROM spine, shoulders, hips and knees.  Skin: Intact, no ulcerations or rash noted.  Psych: Good eye contact, normal affect. Memory mildly impaired CNS: CN 2-12 intact, power,  normal throughout.no focal deficits noted.   Assessment &  Plan  Essential hypertension Controlled, no change in medication DASH diet and commitment to daily physical activity for a minimum of 30 minutes discussed and encouraged, as a part of hypertension management. The importance of attaining a healthy weight is also discussed.  BP/Weight 02/21/2016 02/03/2016 12/29/2015 12/28/2015 12/12/2015 11/04/2015 [id number]  Systolic BP [id number] [id number] 123456 [id number]  Diastolic BP 64 64 62 60 66 70 53  Wt. (Lbs) 300 301 295 295.1 298 297 292  BMI 45.63 45.78 44.86 44.88 46.66 46.51 45.72  Some encounter information is confidential and restricted. Go to Review Flowsheets activity to see all data.        GERD Controlled, no change in medication   Diabetes mellitus type 2 in obese [facility]) Laura Atkins is reminded of the importance of commitment to daily physical activity for 30 minutes or more, as able and the need to limit carbohydrate intake to 30 to 60 grams per meal to help with blood sugar control.   The need to take medication as prescribed, test blood sugar as directed, and to call between visits if there is a concern that blood sugar is uncontrolled is also discussed.   Laura Atkins is reminded of the importance of daily foot exam, annual eye examination, and good blood sugar, blood pressure and cholesterol control. Followed by endo, slight improvement but still not at goal  Diabetic Labs Latest Ref Rng 02/21/2016 02/03/2016 11/04/2015 10/18/2015 08/04/2015  HbA1c - - 8.1 8.2 - -  Microalbumin <2.0 mg/dL - - - - -  Micro/Creat Ratio 0.0 - 30.0 mg/g - - - - -  Chol 125 - 200 mg/dL 165 - - - -  HDL >=46 mg/dL 76 - - - -  Calc LDL <130 mg/dL 64 - - - -  Triglycerides <150 mg/dL 123 - - - -  Creatinine 0.60 - 0.93 mg/dL 1.10(H) - - 1.08(H) 1.10(H)   BP/Weight 02/21/2016 02/03/2016 12/29/2015 12/28/2015 12/12/2015 11/04/2015 [id number]  Systolic BP [id number] 0000000 123456 [id number] 123456 [id number]  Diastolic BP 64 64 62 60 66 70 53  Wt. (Lbs) 300 301 295 295.1 298 297 292  BMI 45.63 45.78  44.86 44.88 46.66 46.51 45.72  Some encounter information is confidential and restricted. Go to Review Flowsheets activity to see all data.   Foot/eye exam completion dates 02/03/2016 07/29/2015  Foot Form Completion Done Done         Morbid obesity Deteriorated. Patient re-educated about  the importance of commitment to a  minimum of 150 minutes of exercise per week.  The importance of healthy food choices with portion control discussed. Encouraged to start a food diary, count calories and to consider  joining a support group. Sample diet sheets offered. Goals set by the patient for the next several months.   Weight /BMI 02/21/2016 02/03/2016 12/29/2015  WEIGHT 300 lb 301 lb 295 lb  HEIGHT 5\' 8"  5\' 8"  5\' 8"   BMI 45.63 kg/m2 45.78 kg/m2 44.86 kg/m2  Some encounter information is confidential and restricted. Go to Review Flowsheets activity to see all data.      URINARY INCONTINENCE Uncontrolled and has had multiple urology evaluations with no real improvement requires incontinence supplies  Anxiety and depression Followed by psychiatry and improved  Osteoarthritis High fall risk, home safety reviewed     Review of Systems     Objective:   Physical Exam        Assessment & Plan:

## 2016-02-23 NOTE — Assessment & Plan Note (Signed)
Uncontrolled and has had multiple urology evaluations with no real improvement requires incontinence supplies

## 2016-02-23 NOTE — Assessment & Plan Note (Signed)
Controlled, no change in medication  

## 2016-02-24 ENCOUNTER — Telehealth: Payer: Self-pay | Admitting: *Deleted

## 2016-02-24 NOTE — Telephone Encounter (Signed)
Message For: OFFICE               Taken  2-JUN-17 at  9:43AM by [facility] ------------------------------------------------------------ [facility]  [facility]ID [id number]  Patient Laura Atkins        Pt's Dr Rexene Alberts        Area Code 336 Phone# [phone]    RE CPAP FOLLOW UP, MADE APPT, AND ISN'T SURE WHEN    PLS CALL BACK TO VERIFY                              Disp:Y/N N If Y = C/B If No Response In 33minutes ============================================================ Lt voice mail.

## 2016-02-28 ENCOUNTER — Telehealth (HOSPITAL_COMMUNITY): Payer: Self-pay

## 2016-02-28 ENCOUNTER — Ambulatory Visit (HOSPITAL_COMMUNITY): Payer: PPO | Attending: Family Medicine

## 2016-02-28 NOTE — Telephone Encounter (Signed)
No show, called and spoke to pt who stated she was sick in the bed.  Pt plans to go to [facility] next week and wishes to wait until apt to make further apts.  Contact info given.    [address], [place]; [facility] [phone]

## 2016-02-29 ENCOUNTER — Other Ambulatory Visit: Payer: Self-pay | Admitting: Family Medicine

## 2016-02-29 ENCOUNTER — Ambulatory Visit (HOSPITAL_COMMUNITY)
Admission: RE | Admit: 2016-02-29 | Discharge: 2016-02-29 | Disposition: A | Discharge status: Home / Self Care | Payer: PPO | Source: Ambulatory Visit | Attending: Family Medicine | Admitting: Family Medicine

## 2016-02-29 ENCOUNTER — Ambulatory Visit (HOSPITAL_COMMUNITY): Payer: Self-pay

## 2016-02-29 DIAGNOSIS — Z1231 Encounter for screening mammogram for malignant neoplasm of breast: Secondary | ICD-10-CM

## 2016-03-02 DIAGNOSIS — L851 Acquired keratosis [keratoderma] palmaris et plantaris: Secondary | ICD-10-CM | POA: Diagnosis not present

## 2016-03-02 DIAGNOSIS — E1342 Other specified diabetes mellitus with diabetic polyneuropathy: Secondary | ICD-10-CM | POA: Diagnosis not present

## 2016-03-02 DIAGNOSIS — B351 Tinea unguium: Secondary | ICD-10-CM | POA: Diagnosis not present

## 2016-03-11 ENCOUNTER — Telehealth: Payer: Self-pay | Admitting: Family Medicine

## 2016-03-11 NOTE — Telephone Encounter (Signed)
Pt called c/o unilateral leg swelling and pain below knee x 2 days, no trauma, I advised ED eval, stated that she understood and would go, children she stated were available to take her

## 2016-03-12 ENCOUNTER — Emergency Department (HOSPITAL_COMMUNITY): Payer: PPO

## 2016-03-12 ENCOUNTER — Telehealth: Payer: Self-pay | Admitting: Family Medicine

## 2016-03-12 ENCOUNTER — Emergency Department (HOSPITAL_COMMUNITY)
Admission: EM | Admit: 2016-03-12 | Discharge: 2016-03-12 | Disposition: A | Discharge status: Home / Self Care | Payer: PPO | Attending: Emergency Medicine | Admitting: Emergency Medicine

## 2016-03-12 ENCOUNTER — Encounter (HOSPITAL_COMMUNITY): Payer: Self-pay | Admitting: Emergency Medicine

## 2016-03-12 ENCOUNTER — Ambulatory Visit (HOSPITAL_COMMUNITY): Payer: Self-pay | Admitting: Psychiatry

## 2016-03-12 DIAGNOSIS — G40909 Epilepsy, unspecified, not intractable, without status epilepticus: Secondary | ICD-10-CM | POA: Diagnosis not present

## 2016-03-12 DIAGNOSIS — I10 Essential (primary) hypertension: Secondary | ICD-10-CM | POA: Diagnosis not present

## 2016-03-12 DIAGNOSIS — M79605 Pain in left leg: Secondary | ICD-10-CM

## 2016-03-12 DIAGNOSIS — E785 Hyperlipidemia, unspecified: Secondary | ICD-10-CM | POA: Insufficient documentation

## 2016-03-12 DIAGNOSIS — M25462 Effusion, left knee: Secondary | ICD-10-CM | POA: Diagnosis not present

## 2016-03-12 DIAGNOSIS — R52 Pain, unspecified: Secondary | ICD-10-CM

## 2016-03-12 DIAGNOSIS — Z7982 Long term (current) use of aspirin: Secondary | ICD-10-CM | POA: Insufficient documentation

## 2016-03-12 DIAGNOSIS — E119 Type 2 diabetes mellitus without complications: Secondary | ICD-10-CM | POA: Insufficient documentation

## 2016-03-12 DIAGNOSIS — M1990 Unspecified osteoarthritis, unspecified site: Secondary | ICD-10-CM | POA: Insufficient documentation

## 2016-03-12 DIAGNOSIS — F329 Major depressive disorder, single episode, unspecified: Secondary | ICD-10-CM | POA: Diagnosis not present

## 2016-03-12 DIAGNOSIS — M7989 Other specified soft tissue disorders: Secondary | ICD-10-CM | POA: Diagnosis not present

## 2016-03-12 DIAGNOSIS — Z79899 Other long term (current) drug therapy: Secondary | ICD-10-CM | POA: Diagnosis not present

## 2016-03-12 DIAGNOSIS — Z794 Long term (current) use of insulin: Secondary | ICD-10-CM | POA: Insufficient documentation

## 2016-03-12 DIAGNOSIS — M25452 Effusion, left hip: Secondary | ICD-10-CM | POA: Diagnosis not present

## 2016-03-12 LAB — CBC WITH DIFFERENTIAL/PLATELET
Basophils Absolute: 0 10*3/uL (ref 0.0–0.1)
Basophils Relative: 0 %
Eosinophils Absolute: 0.3 10*3/uL (ref 0.0–0.7)
Eosinophils Relative: 5 %
HCT: 31.4 % — ABNORMAL LOW (ref 36.0–46.0)
Hemoglobin: 9.6 g/dL — ABNORMAL LOW (ref 12.0–15.0)
Lymphocytes Relative: 39 %
Lymphs Abs: 2.3 10*3/uL (ref 0.7–4.0)
MCH: 21 pg — ABNORMAL LOW (ref 26.0–34.0)
MCHC: 30.6 g/dL (ref 30.0–36.0)
MCV: 68.6 fL — ABNORMAL LOW (ref 78.0–100.0)
Monocytes Absolute: 0.3 10*3/uL (ref 0.1–1.0)
Monocytes Relative: 5 %
Neutro Abs: 2.9 10*3/uL (ref 1.7–7.7)
Neutrophils Relative %: 51 %
Platelets: 207 10*3/uL (ref 150–400)
RBC: 4.58 MIL/uL (ref 3.87–5.11)
RDW: 15.6 % — ABNORMAL HIGH (ref 11.5–15.5)
Smear Review: ADEQUATE
WBC: 5.8 10*3/uL (ref 4.0–10.5)

## 2016-03-12 LAB — BASIC METABOLIC PANEL
Anion gap: 6 (ref 5–15)
BUN: 35 mg/dL — ABNORMAL HIGH (ref 6–20)
CO2: 29 mmol/L (ref 22–32)
Calcium: 9.1 mg/dL (ref 8.9–10.3)
Chloride: 100 mmol/L — ABNORMAL LOW (ref 101–111)
Creatinine, Ser: 1.31 mg/dL — ABNORMAL HIGH (ref 0.44–1.00)
GFR calc Af Amer: 46 mL/min — ABNORMAL LOW (ref >60)
GFR calc non Af Amer: 40 mL/min — ABNORMAL LOW (ref >60)
Glucose, Bld: 233 mg/dL — ABNORMAL HIGH (ref 65–99)
Potassium: 4.5 mmol/L (ref 3.5–5.1)
Sodium: 135 mmol/L (ref 135–145)

## 2016-03-12 MED ORDER — OXYCODONE-ACETAMINOPHEN 5-325 MG PO TABS: 1.0000 | ORAL_TABLET | Freq: Three times a day (TID) | ORAL | Status: DC | PRN

## 2016-03-12 MED ORDER — OXYCODONE-ACETAMINOPHEN 5-325 MG PO TABS
1.0000 | ORAL_TABLET | Freq: Once | ORAL | Status: AC
  Administered 2016-03-12: 1 via ORAL
  Filled 2016-03-12: qty 1

## 2016-03-12 NOTE — Telephone Encounter (Signed)
Patient is currently at [facility]

## 2016-03-12 NOTE — Telephone Encounter (Signed)
pls place f/u call to pt. I recommended ED eval yesterday for unilaterally swollen leg, don't see an encounter , (may have gone to [facility])  Pls place f/u call for continuity [place] Let me know response pls

## 2016-03-12 NOTE — ED Notes (Addendum)
Patient complaining of left leg pain "from the thigh to below my knee" since yesterday. Denies injury.

## 2016-03-12 NOTE — ED Provider Notes (Signed)
CSN: [id number]     Arrival date & time 03/12/16  1208 History   First MD Initiated Contact with Patient 03/12/16 1303     Chief Complaint  Patient presents with  . Leg Pain    Patient is a 75 y.o. female presenting with leg pain. The history is provided by the patient.  Leg Pain Location:  Leg Injury: no   Leg location:  L upper leg Pain details:    Quality:  Aching   Radiates to: left calf.   Severity:  Moderate   Onset quality:  Gradual   Duration:  1 day   Timing:  Constant   Progression:  Worsening Chronicity:  New Dislocation: no   Relieved by:  Rest Worsened by:  Activity Associated symptoms: swelling   Associated symptoms: no back pain and no fever   Patient with h/o hypertension/diabetes who presents with left thigh/calf pain for one day She denies any injury She reports pain starts in left thigh and moves into left calf.  She also has pain "behind" left knee No weakness reported in left LE Denies h/o DVT   Past Medical History  Diagnosis Date  . Hypertension   . Diabetes mellitus   . Hyperlipidemia   . Obesity   . Anemia   . Anxiety and depression   . GERD (gastroesophageal reflux disease)   . Pruritus   . Osteoarthritis     Left knee; right shoulder; chronic neck and back pain  . Obstructive sleep apnea   . Urinary incontinence   . Tremor     This started months ago after her seizure progressing to very poor hand writing  . Seizures ([place])   . Depression   . Anxiety   . [id number])    Past Surgical History  Procedure Laterality Date  . Abdominal hysterectomy    . Breast excisional biopsy      Left; cyst  . Cholecystectomy    . Cataract extraction w/ intraocular lens implant Left 09/07/2013  . Eye surgery Left 09/07/2013    cataract  . Colonoscopy    . Colonoscopy N/A 07/20/2015    Procedure: COLONOSCOPY;  Surgeon: Rogene Houston, MD;  Location: [facility];  Service: Endoscopy;  Laterality: N/A;  930   Family History  Problem  Relation Age of Onset  . ADD / ADHD Grandchild   . Bipolar disorder Grandchild   . Bipolar disorder Daughter   . Alcohol abuse Neg Hx   . Drug abuse Neg Hx   . Cancer Mother 22    lung   . Kidney disease Father   . Diabetes Sister    Social History  Substance Use Topics  . Smoking status: Never Smoker   . Smokeless tobacco: Never Used  . Alcohol Use: No   OB History    No data available     Review of Systems  Constitutional: Negative for fever.  Respiratory: Negative for shortness of breath.   Cardiovascular: Positive for leg swelling. Negative for chest pain.  Musculoskeletal: Positive for arthralgias. Negative for back pain.  Neurological: Negative for weakness.  All other systems reviewed and are negative.     Allergies  Penicillins; Prednisone; and Propoxyphene n-acetaminophen  Home Medications   Prior to Admission medications   Medication Sig Start Date End Date Taking? Authorizing Provider  acetaminophen (EXTRA STRENGTH PAIN RELIEF) 500 MG tablet Take 500 mg by mouth every 6 (six) hours as needed for pain.    Historical Provider, MD  amLODipine (NORVASC) 10 MG tablet TAKE 1 TABLET BY MOUTH EVERY DAY 12/15/15   Fayrene Helper, MD  aspirin 81 MG tablet Take 81 mg by mouth daily.      Historical Provider, MD  atorvastatin (LIPITOR) 40 MG tablet TAKE 1 TABLET BY MOUTH EVERY DAY 12/15/15   Fayrene Helper, MD  benazepril (LOTENSIN) 20 MG tablet TAKE 1 TABLET BY MOUTH EVERY DAY 12/15/15   Fayrene Helper, MD  diphenhydrAMINE (BENADRYL) 25 mg capsule Take 25 mg by mouth every 6 (six) hours as needed. Reported on 10/18/2015    Historical Provider, MD  gabapentin (NEURONTIN) 400 MG capsule TAKE 1 CAPSULE BY MOUTH TWICE DAILY AND 2 CAPSULES AT BEDTIME 02/13/16   Fayrene Helper, MD  insulin NPH-regular Human (NOVOLIN 70/30) (70-30) 100 UNIT/ML injection 50 units with breakfast and 25 units with supper, and syringes 2/day. 07/29/15   Renato Shin, MD  Multiple  Vitamins-Minerals (CENTRUM SILVER ULTRA WOMENS PO) Take 1 tablet by mouth daily. Reported on 02/07/2016    Historical Provider, MD  omeprazole (PRILOSEC) 20 MG capsule TAKE 1 CAPSULE BY MOUTH DAILY. 12/15/15   Fayrene Helper, MD  ONE TOUCH ULTRA TEST test strip USE TO TEST BLOOD SUGAR FOUR TIMES DAILY 10/17/15   Renato Shin, MD  potassium chloride SA (K-DUR,KLOR-CON) 20 MEQ tablet TAKE 1 TABLET BY MOUTH TWICE DAILY 11/15/15   Fayrene Helper, MD  sertraline (ZOLOFT) 100 MG tablet Take 1 tablet (100 mg total) by mouth 2 (two) times daily. 02/07/16 02/06/17  Cloria Spring, MD  torsemide (DEMADEX) 20 MG tablet TAKE 2 TABLETS BY MOUTH TWICE DAILY -STOP LASIX 02/13/16   Arnoldo Lenis, MD   BP 138/35 mmHg  Pulse 76  Temp(Src) 98.3 F (36.8 C) (Oral)  Resp 18  Ht 5\' 8"  (1.727 m)  Wt 136.079 kg  BMI 45.63 kg/m2  SpO2 98% Physical Exam CONSTITUTIONAL: Well developed/well nourished HEAD: Normocephalic/atraumatic EYES: EOMI/PERRL ENMT: Mucous membranes moist NECK: supple no meningeal signs SPINE/BACK:entire spine nontender CV: S1/S2 noted, no murmurs/rubs/gallops noted LUNGS: Lungs are clear to auscultation bilaterally, no apparent distress ABDOMEN: soft, nontender, no rebound or guarding, bowel sounds noted throughout abdomen NEURO: Pt is awake/alert/appropriate, moves all extremitiesx4.  No facial droop.   EXTREMITIES: pulses normal/equal, full ROM.  Distal pulses equal/intact in both feet.  She has tenderness to left thigh as well as left calf.  No significant tenderness to left knee.  Pitting edema noted in the lower extremities.  No erythema. No crepitus noted.   Exam limited due to obesity SKIN: warm, color normal PSYCH: no abnormalities of mood noted, alert and oriented to situation  ED Course  Procedures  1:45 PM Pt with left thigh/calf pain for one day No visible trauma, but will proceed with xray of left femur due to exquisite pain in the limb as well as ultrasound to rule  out DVT No signs of cellulitis 3:25 PM Imaging negative for DVT Pt feels improved Per imaging she does have evidence of knee effusion but clinically difficult to see due to body habitus.   No overlying erythema/warmth to suggest septic joint Advised nursing to place ace wrap She has walker at home to use Referred to ortho Pt feels comfortable for d/c home  Labs Review Labs Reviewed  CBC WITH DIFFERENTIAL/PLATELET - Abnormal; Notable for the following:    Hemoglobin 9.6 (*)    HCT 31.4 (*)    MCV 68.6 (*)    MCH 21.0 (*)  RDW 15.6 (*)    All other components within normal limits  BASIC METABOLIC PANEL - Abnormal; Notable for the following:    Chloride 100 (*)    Glucose, Bld 233 (*)    BUN 35 (*)    Creatinine, Ser 1.31 (*)    GFR calc non Af Amer 40 (*)    GFR calc Af Amer 46 (*)    All other components within normal limits    Imaging Review US Venous Img Lower Unilateral Left  03/12/2016  CLINICAL DATA:  Left lower extremity pain and swelling for 3 days. EXAM: LEFT LOWER EXTREMITY VENOUS DOPPLER ULTRASOUND TECHNIQUE: Gray-scale sonography with graded compression, as well as color Doppler and duplex ultrasound were performed to evaluate the lower extremity deep venous systems from the level of the common femoral vein and including the common femoral, femoral, profunda femoral, popliteal and calf veins including the posterior tibial, peroneal and gastrocnemius veins when visible. The superficial great saphenous vein was also interrogated. Spectral Doppler was utilized to evaluate flow at rest and with distal augmentation maneuvers in the common femoral, femoral and popliteal veins. COMPARISON:  Plain films of 01/19/2010 FINDINGS: Contralateral Common Femoral Vein: Respiratory phasicity is normal and symmetric with the symptomatic side. No evidence of thrombus. Normal compressibility. Common Femoral Vein: No evidence of thrombus. Normal compressibility, respiratory phasicity and  response to augmentation. Saphenofemoral Junction: No evidence of thrombus. Normal compressibility and flow on color Doppler imaging. Profunda Femoral Vein: No evidence of thrombus. Normal compressibility and flow on color Doppler imaging. Femoral Vein: No evidence of thrombus. Normal compressibility, respiratory phasicity and response to augmentation. Popliteal Vein: No evidence of thrombus. Normal compressibility, respiratory phasicity and response to augmentation. Calf veins:  Not well evaluated secondary to patient body habitus. Superficial Great Saphenous Vein: No evidence of thrombus. Normal compressibility and flow on color Doppler imaging. Other Findings:  Degraded exam secondary to patient body habitus. IMPRESSION: No evidence of deep venous thrombosis. Decreased sensitivity and specificity exam due to technique related factors, as described above. Electronically Signed   By: Abigail Miyamoto M.D.   On: 03/12/2016 14:55   Dg Femur Min 2 Views Left  03/12/2016  CLINICAL DATA:  Left femur pain starting 03/10/2016 EXAM: LEFT FEMUR 2 VIEWS COMPARISON:  None. FINDINGS: Four views of the left femur submitted. No acute fracture or subluxation. There is diffuse osteopenia. Degenerative changes are noted left knee joint. Significant narrowing of medial joint compartment. Mild chondrocalcinosis noted left knee joint. There is mild spurring of lateral femoral condyle. Significant narrowing of patellofemoral joint space. Spurring of patella. Moderate joint effusion. IMPRESSION: No acute fracture or subluxation. Diffuse osteopenia. Significant degenerative changes left knee joint. Moderate joint effusion. Mild chondrocalcinosis. Electronically Signed   By: Lahoma Crocker M.D.   On: 03/12/2016 14:21   I have personally reviewed and evaluated these images and lab results as part of my medical decision-making.   Medications  oxyCODONE-acetaminophen (PERCOCET/ROXICET) 5-325 MG per tablet 1 tablet (1 tablet Oral Given  03/12/16 1351)    MDM   Final diagnoses:  Knee effusion, left  Pain In Left Leg    Nursing notes including past medical history and social history reviewed and considered in documentation xrays/imaging reviewed by myself and considered during evaluation Labs/vital reviewed myself and considered during evaluation     Ripley Fraise, MD 03/12/16 1526

## 2016-03-14 ENCOUNTER — Other Ambulatory Visit: Payer: Self-pay | Admitting: Cardiology

## 2016-03-14 ENCOUNTER — Other Ambulatory Visit: Payer: Self-pay | Admitting: Family Medicine

## 2016-03-14 ENCOUNTER — Other Ambulatory Visit: Payer: Self-pay | Admitting: Endocrinology

## 2016-03-15 ENCOUNTER — Telehealth: Payer: Self-pay | Admitting: Family Medicine

## 2016-03-15 NOTE — Telephone Encounter (Signed)
Attempted to reach patient. Unable to leave message.

## 2016-03-15 NOTE — Telephone Encounter (Signed)
Laura Atkins is calling asking for the test results of labs and xrays that was preformed in the ER on Monday, please advise?

## 2016-03-16 ENCOUNTER — Telehealth: Payer: Self-pay | Admitting: Family Medicine

## 2016-03-16 DIAGNOSIS — M8949 Other hypertrophic osteoarthropathy, multiple sites: Secondary | ICD-10-CM

## 2016-03-16 DIAGNOSIS — M159 Polyosteoarthritis, unspecified: Secondary | ICD-10-CM

## 2016-03-16 DIAGNOSIS — M150 Primary generalized (osteo)arthritis: Principal | ICD-10-CM

## 2016-03-16 DIAGNOSIS — M25562 Pain in left knee: Secondary | ICD-10-CM

## 2016-03-16 MED ORDER — VITAMIN D (ERGOCALCIFEROL) 1.25 MG (50000 UNIT) PO CAPS: 50000.0000 [IU] | ORAL_CAPSULE | ORAL | Status: DC

## 2016-03-16 NOTE — Telephone Encounter (Signed)
Per recent ED visit last week for severe osteo of knee with fluid in the knee and inability to weight bear, it was recommended she see ortho, which I believe she needs. ( Also received hydrocodone) Pls call and f/u on need for ortho eval and management, pls refer to ortho of her choice, [facility] or [place], I believe she has seen  Dr Aline Brochure in the past, I will sign

## 2016-03-16 NOTE — Telephone Encounter (Signed)
Patient aware.

## 2016-03-16 NOTE — Telephone Encounter (Signed)
Could not reach patient. Tried to call number several times and kept getting error message

## 2016-03-16 NOTE — Telephone Encounter (Signed)
Left message with husband for patient to return call

## 2016-03-16 NOTE — Addendum Note (Signed)
Addended by: Denman George B on: 03/16/2016 10:51 AM   Modules accepted: Orders

## 2016-03-19 DIAGNOSIS — G4733 Obstructive sleep apnea (adult) (pediatric): Secondary | ICD-10-CM | POA: Diagnosis not present

## 2016-03-20 NOTE — Addendum Note (Signed)
Addended by: Eual Fines on: 03/20/2016 02:21 PM   Modules accepted: Orders

## 2016-03-20 NOTE — Telephone Encounter (Signed)
Patient referred.

## 2016-03-21 ENCOUNTER — Telehealth: Payer: Self-pay | Admitting: Family Medicine

## 2016-03-21 ENCOUNTER — Other Ambulatory Visit: Payer: Self-pay | Admitting: Family Medicine

## 2016-03-21 MED ORDER — NAPROXEN 500 MG PO TABS: 500.0000 mg | ORAL_TABLET | Freq: Two times a day (BID) | ORAL | Status: DC

## 2016-03-21 NOTE — Telephone Encounter (Signed)
Naproxen sent one twice daily for short term use till she sees ortho, pls let her know

## 2016-03-21 NOTE — Telephone Encounter (Signed)
Patient would like to know if any additional medication can be sent.

## 2016-03-21 NOTE — Telephone Encounter (Signed)
Laura Atkins is stating that she is hurting real bad in that left leg like something is torn, she is scheduled in [place] with Ortho on July 6th at 1:00 and she is aware of this. She is asking to speak to the nurse?

## 2016-03-21 NOTE — Telephone Encounter (Signed)
Please advise 

## 2016-03-22 NOTE — Telephone Encounter (Signed)
Patient aware.

## 2016-03-28 ENCOUNTER — Ambulatory Visit (HOSPITAL_COMMUNITY): Payer: Self-pay | Admitting: Psychiatry

## 2016-03-29 ENCOUNTER — Encounter (HOSPITAL_COMMUNITY): Payer: Self-pay | Admitting: *Deleted

## 2016-03-29 DIAGNOSIS — M1712 Unilateral primary osteoarthritis, left knee: Secondary | ICD-10-CM | POA: Diagnosis not present

## 2016-03-30 ENCOUNTER — Telehealth: Payer: Self-pay | Admitting: Family Medicine

## 2016-03-30 NOTE — Telephone Encounter (Signed)
Need to clarify if patient saw ortho.

## 2016-03-30 NOTE — Telephone Encounter (Signed)
Patient is stating that she needs a refill on naproxen (NAPROSYN) 500 MG tablet  Please advise?

## 2016-03-31 NOTE — Telephone Encounter (Signed)
Patient was scheduled for ortho on July 6th. Will clarify if they prescribed any med

## 2016-04-01 ENCOUNTER — Other Ambulatory Visit: Payer: Self-pay | Admitting: Family Medicine

## 2016-04-02 NOTE — Telephone Encounter (Signed)
Medication sent.

## 2016-04-02 NOTE — Telephone Encounter (Signed)
refill x 1 pls

## 2016-04-02 NOTE — Telephone Encounter (Signed)
Is it ok to refill?  States that ortho did not prescribe her any medication.  States that it is arthritis and bone spurs in her knee.  No injections from ortho.

## 2016-04-04 ENCOUNTER — Ambulatory Visit (HOSPITAL_COMMUNITY): Payer: Self-pay | Admitting: Psychiatry

## 2016-04-04 ENCOUNTER — Ambulatory Visit: Payer: Self-pay | Admitting: Neurology

## 2016-04-13 ENCOUNTER — Other Ambulatory Visit: Payer: Self-pay | Admitting: Cardiology

## 2016-04-17 ENCOUNTER — Telehealth: Payer: Self-pay | Admitting: Neurology

## 2016-04-17 NOTE — Telephone Encounter (Signed)
Wanda/ [employer] called and says she does not think there is anything wrong with her CPAP and it has be switched out. She suggest to look at [facility] to look at her usage. May call back to discuss [phone]

## 2016-04-17 NOTE — Telephone Encounter (Signed)
Pt called to r/s appt on 8/14 per [facility] request. Pt said this is the 2nd CPAP machine and it cuts off during the night just as the 1st CPAP did. I advised her to go to [place] this week and take CPAP so it can be checked for any problems. Pt said she has been using this machine for about 3 weeks.

## 2016-04-17 NOTE — Telephone Encounter (Signed)
I sent [facility] a message letting them know that patient is having trouble with CPAP. I advised that we told the patient to contact them as well. I asked that they check her machine, make sure she is using machine correctly, and that there is not a bunch of leaking.

## 2016-04-17 NOTE — Telephone Encounter (Signed)
I called back and spoke to Mathis. She is aware of this case. She did advised me that they have switched patient's machine out. I asked if they can bring patient in to make sure that she is using the machine correctly. She advised that they would.

## 2016-04-18 DIAGNOSIS — G4733 Obstructive sleep apnea (adult) (pediatric): Secondary | ICD-10-CM | POA: Diagnosis not present

## 2016-04-26 ENCOUNTER — Telehealth: Payer: Self-pay

## 2016-04-26 NOTE — Telephone Encounter (Signed)
I have spoke to Laura Atkins before about this problem. Last conversation I had with them I was basically told that the problem is that she is not using CPAP. [name] advised me that her machine is not making noise. Do you think that this may be the sound of air leaking from mask? If so should we get her in to see [name] for help?

## 2016-04-26 NOTE — Telephone Encounter (Signed)
Thank you very much Robin, that's about all we can do. Let's hope, she does well.

## 2016-04-26 NOTE — Telephone Encounter (Signed)
Spoke with tech at [employer]. They did check machine out and it does not make noise. They have given her three different mask to try and she wore one for one night but had a leak. They told her to tighten mask but she did not try that. They do not know what else to do. I left her message to call me and to schedule a time to come in and let me look at  Machine and mask. She has until August 24th to get compliant. Hopefully we can help her.

## 2016-04-26 NOTE — Telephone Encounter (Signed)
Patient called to advise, CPAP machine is making that noise again. Husband woke her up to the sound it was making. Please call to advise.

## 2016-04-26 NOTE — Telephone Encounter (Signed)
We can try to look into this. Bottom line is, she does have a high leak, in the least, her DME company should be working with her to reduce the leak from the mask. That is irrespective of how much she is using the machine. I'm not sure about the noise from the machine, could be from leaking air but that should be addressed first with a DME company.  Diana: Please advise [facility] to work with patient more closely to reduce air leak, that is really something they should provide better care for.  If they are unwilling to work on leak reduction, maybe patient can come in with mask and machine and see Robin.   Robin: your thoughts?

## 2016-05-01 ENCOUNTER — Telehealth: Payer: Self-pay

## 2016-05-01 NOTE — Telephone Encounter (Signed)
Patient came to lab for me to look at cpap machine and mask. She states she wears machine nightly but the noise of it always wakes her up. She has been to [employer] several times and they do not know how to help her. Her download shows she is only using about 43 minutes nightly. She claims she is using it all night. Her husband states the same thing. She is currently on F&P simplus. She feels her mask blows her cheeks up. There is a high leak. I fitted her with other full face mask and still their is a high leak. I fitted her with a nasal F&F isom medium and she loved it. 0 leak. She could breathe better. I told her to try this and see if she mouth breathes at night. I put a new chip in her machine. Maybe the chip was not in correctly. She is on a RESMED 10 machine. I will check her download in few days and give her a call.

## 2016-05-01 NOTE — Telephone Encounter (Signed)
Pt called in and says that she has called [facility] twice and they do not know what to do. They are telling her the machine is not recording either. I transferred call to [place].

## 2016-05-02 ENCOUNTER — Ambulatory Visit: Payer: Self-pay | Admitting: Family Medicine

## 2016-05-02 ENCOUNTER — Encounter: Payer: Self-pay | Admitting: Family Medicine

## 2016-05-03 ENCOUNTER — Ambulatory Visit (HOSPITAL_COMMUNITY): Payer: Self-pay | Admitting: Oncology

## 2016-05-04 ENCOUNTER — Other Ambulatory Visit: Payer: Self-pay | Admitting: Cardiology

## 2016-05-04 ENCOUNTER — Other Ambulatory Visit: Payer: Self-pay | Admitting: Family Medicine

## 2016-05-04 DIAGNOSIS — I6523 Occlusion and stenosis of bilateral carotid arteries: Secondary | ICD-10-CM

## 2016-05-07 ENCOUNTER — Ambulatory Visit: Payer: Self-pay | Admitting: Neurology

## 2016-05-08 ENCOUNTER — Ambulatory Visit (HOSPITAL_COMMUNITY)
Admission: RE | Admit: 2016-05-08 | Discharge: 2016-05-08 | Disposition: A | Discharge status: Home / Self Care | Payer: PPO | Source: Ambulatory Visit | Attending: Podiatry | Admitting: Podiatry

## 2016-05-08 ENCOUNTER — Other Ambulatory Visit (HOSPITAL_COMMUNITY): Payer: Self-pay | Admitting: Podiatry

## 2016-05-08 ENCOUNTER — Ambulatory Visit: Payer: Self-pay | Admitting: Endocrinology

## 2016-05-08 DIAGNOSIS — M79671 Pain in right foot: Secondary | ICD-10-CM

## 2016-05-08 DIAGNOSIS — L97519 Non-pressure chronic ulcer of other part of right foot with unspecified severity: Secondary | ICD-10-CM | POA: Diagnosis not present

## 2016-05-08 DIAGNOSIS — E11621 Type 2 diabetes mellitus with foot ulcer: Secondary | ICD-10-CM | POA: Diagnosis not present

## 2016-05-08 DIAGNOSIS — L89610 Pressure ulcer of right heel, unstageable: Secondary | ICD-10-CM | POA: Insufficient documentation

## 2016-05-09 ENCOUNTER — Ambulatory Visit: Payer: Self-pay | Admitting: Endocrinology

## 2016-05-10 ENCOUNTER — Telehealth: Payer: Self-pay

## 2016-05-10 NOTE — Telephone Encounter (Signed)
Spoke with patient on 05-04-16. Encouraged her again to use her cpap machine. Checked download to day and she used it for 2 days and has not used it since. She claims she is wearing it  every night but download shows no compliance.Medicare will take her machine on 05-17-16. I explained she needed to wear cpap at least 4hrs nightly.

## 2016-05-14 ENCOUNTER — Other Ambulatory Visit: Payer: Self-pay | Admitting: Family Medicine

## 2016-05-14 ENCOUNTER — Other Ambulatory Visit (HOSPITAL_COMMUNITY): Payer: Self-pay | Admitting: Psychiatry

## 2016-05-15 DIAGNOSIS — L89610 Pressure ulcer of right heel, unstageable: Secondary | ICD-10-CM | POA: Diagnosis not present

## 2016-05-15 DIAGNOSIS — M79671 Pain in right foot: Secondary | ICD-10-CM | POA: Diagnosis not present

## 2016-05-16 ENCOUNTER — Ambulatory Visit: Payer: PPO

## 2016-05-16 DIAGNOSIS — I6523 Occlusion and stenosis of bilateral carotid arteries: Secondary | ICD-10-CM

## 2016-05-16 LAB — VAS US CAROTID
LEFT ECA DIAS: 0 cm/s
LEFT VERTEBRAL DIAS: -12 cm/s
Left CCA dist dias: -15 cm/s
Left CCA dist sys: -97 cm/s
Left CCA prox dias: 7 cm/s
Left CCA prox sys: 88 cm/s
Left ICA dist dias: -23 cm/s
Left ICA dist sys: -108 cm/s
Left ICA prox dias: -14 cm/s
Left ICA prox sys: -96 cm/s
RIGHT ECA DIAS: -5 cm/s
RIGHT VERTEBRAL DIAS: 11 cm/s
Right CCA prox dias: 9 cm/s
Right CCA prox sys: 83 cm/s
Right cca dist sys: -129 cm/s

## 2016-05-18 ENCOUNTER — Other Ambulatory Visit: Payer: Self-pay | Admitting: Cardiology

## 2016-05-18 ENCOUNTER — Telehealth: Payer: Self-pay | Admitting: Cardiology

## 2016-05-18 NOTE — Telephone Encounter (Signed)
Spoke with Cyril Mourning at [facility] says pt neede refills on torsemide - pt made appt 9/7 with Dr. Harl Bowie - gave pt 30 day supply until appt.

## 2016-05-18 NOTE — Telephone Encounter (Signed)
Please call [facility] in regards to patients medications.

## 2016-05-22 DIAGNOSIS — M79671 Pain in right foot: Secondary | ICD-10-CM | POA: Diagnosis not present

## 2016-05-22 DIAGNOSIS — L89610 Pressure ulcer of right heel, unstageable: Secondary | ICD-10-CM | POA: Diagnosis not present

## 2016-05-23 ENCOUNTER — Ambulatory Visit (INDEPENDENT_AMBULATORY_CARE_PROVIDER_SITE_OTHER): Payer: PPO | Admitting: Family Medicine

## 2016-05-23 ENCOUNTER — Telehealth: Payer: Self-pay

## 2016-05-23 ENCOUNTER — Encounter: Payer: Self-pay | Admitting: Family Medicine

## 2016-05-23 ENCOUNTER — Ambulatory Visit: Payer: Self-pay | Admitting: Neurology

## 2016-05-23 VITALS — BP 138/80 | HR 89 | Resp 16 | Ht 68.0 in | Wt 296.0 lb

## 2016-05-23 DIAGNOSIS — E119 Type 2 diabetes mellitus without complications: Secondary | ICD-10-CM | POA: Diagnosis not present

## 2016-05-23 DIAGNOSIS — Z23 Encounter for immunization: Secondary | ICD-10-CM

## 2016-05-23 DIAGNOSIS — E669 Obesity, unspecified: Secondary | ICD-10-CM

## 2016-05-23 DIAGNOSIS — I10 Essential (primary) hypertension: Secondary | ICD-10-CM | POA: Diagnosis not present

## 2016-05-23 DIAGNOSIS — E785 Hyperlipidemia, unspecified: Secondary | ICD-10-CM

## 2016-05-23 DIAGNOSIS — Z7409 Other reduced mobility: Secondary | ICD-10-CM

## 2016-05-23 DIAGNOSIS — F418 Other specified anxiety disorders: Secondary | ICD-10-CM

## 2016-05-23 DIAGNOSIS — M179 Osteoarthritis of knee, unspecified: Secondary | ICD-10-CM

## 2016-05-23 DIAGNOSIS — F419 Anxiety disorder, unspecified: Secondary | ICD-10-CM

## 2016-05-23 DIAGNOSIS — E1169 Type 2 diabetes mellitus with other specified complication: Secondary | ICD-10-CM

## 2016-05-23 DIAGNOSIS — M4713 Other spondylosis with myelopathy, cervicothoracic region: Secondary | ICD-10-CM

## 2016-05-23 DIAGNOSIS — M1712 Unilateral primary osteoarthritis, left knee: Secondary | ICD-10-CM

## 2016-05-23 DIAGNOSIS — F329 Major depressive disorder, single episode, unspecified: Secondary | ICD-10-CM

## 2016-05-23 DIAGNOSIS — F32A Depression, unspecified: Secondary | ICD-10-CM

## 2016-05-23 LAB — HEMOGLOBIN A1C
Hgb A1c MFr Bld: 7.6 % — ABNORMAL HIGH (ref <5.7)
Mean Plasma Glucose: 171 mg/dL

## 2016-05-23 LAB — BASIC METABOLIC PANEL WITH GFR
BUN: 21 mg/dL (ref 7–25)
CO2: 29 mmol/L (ref 20–31)
Calcium: 9.5 mg/dL (ref 8.6–10.4)
Chloride: 101 mmol/L (ref 98–110)
Creat: 1.04 mg/dL — ABNORMAL HIGH (ref 0.60–0.93)
GFR, Est African American: 62 mL/min (ref >=60)
GFR, Est Non African American: 54 mL/min — ABNORMAL LOW (ref >=60)
Glucose, Bld: 177 mg/dL — ABNORMAL HIGH (ref 65–99)
Potassium: 4.6 mmol/L (ref 3.5–5.3)
Sodium: 139 mmol/L (ref 135–146)

## 2016-05-23 NOTE — Patient Instructions (Addendum)
Annual exam in December , call if you need me before  You are referred to physical therapy to [facility] twice weekly for 6 weeks    Start the [facility] again  Please schedule appt for therapy with Ms bynum   Labs today hBA1C , chem7 and EGFR, mAKE and kEEP appt with Dr Loanne Drilling  Flu vaccine today'  Work on changing eating  To lose weight and improve health  Thank you  for choosing [facility]. We consider it a privelige to serve you.  Delivering excellent health care in a caring and  compassionate way is our goal.  Partnering with you,  so that together we can achieve this goal is our strategy.

## 2016-05-23 NOTE — Telephone Encounter (Signed)
Patient did not show to appt today  

## 2016-05-24 ENCOUNTER — Encounter (HOSPITAL_COMMUNITY): Payer: Self-pay | Admitting: Physical Therapy

## 2016-05-24 NOTE — Therapy (Signed)
[place], [place], [address] Phone: [phone]   Fax:  [phone]  Patient Details  Name: Laura Atkins MRN: [id number] Date of Birth: 01/16/1941 Referring Provider:  No ref. provider found  Encounter Date: 05/24/2016   PHYSICAL THERAPY DISCHARGE SUMMARY  Visits from Start of Care: 7  Current functional level related to goals / functional outcomes: Patient has not returned since last scheduled session    Remaining deficits: Unable to assess    Education / Equipment: N/A  Plan: Patient agrees to discharge.  Patient goals were partially met. Patient is being discharged due to not returning since the last visit.  ?????     Deniece Ree PT, DPT [place] [address] [place], [place], [address] Phone: [phone]   Fax:  [phone]

## 2016-05-25 ENCOUNTER — Encounter (HOSPITAL_COMMUNITY): Payer: Self-pay | Admitting: Hematology & Oncology

## 2016-05-25 ENCOUNTER — Encounter (HOSPITAL_COMMUNITY): Payer: PPO | Attending: Hematology & Oncology | Admitting: Hematology & Oncology

## 2016-05-25 ENCOUNTER — Encounter (HOSPITAL_COMMUNITY): Payer: PPO | Attending: Hematology & Oncology

## 2016-05-25 DIAGNOSIS — Z993 Dependence on wheelchair: Secondary | ICD-10-CM | POA: Diagnosis not present

## 2016-05-25 DIAGNOSIS — L299 Pruritus, unspecified: Secondary | ICD-10-CM | POA: Insufficient documentation

## 2016-05-25 DIAGNOSIS — M1990 Unspecified osteoarthritis, unspecified site: Secondary | ICD-10-CM | POA: Insufficient documentation

## 2016-05-25 DIAGNOSIS — M545 Low back pain: Secondary | ICD-10-CM | POA: Diagnosis not present

## 2016-05-25 DIAGNOSIS — E119 Type 2 diabetes mellitus without complications: Secondary | ICD-10-CM | POA: Diagnosis not present

## 2016-05-25 DIAGNOSIS — D649 Anemia, unspecified: Secondary | ICD-10-CM | POA: Diagnosis not present

## 2016-05-25 DIAGNOSIS — R32 Unspecified urinary incontinence: Secondary | ICD-10-CM | POA: Insufficient documentation

## 2016-05-25 DIAGNOSIS — R569 Unspecified convulsions: Secondary | ICD-10-CM | POA: Insufficient documentation

## 2016-05-25 DIAGNOSIS — R531 Weakness: Secondary | ICD-10-CM | POA: Diagnosis not present

## 2016-05-25 DIAGNOSIS — I10 Essential (primary) hypertension: Secondary | ICD-10-CM | POA: Insufficient documentation

## 2016-05-25 DIAGNOSIS — G4733 Obstructive sleep apnea (adult) (pediatric): Secondary | ICD-10-CM | POA: Insufficient documentation

## 2016-05-25 DIAGNOSIS — E669 Obesity, unspecified: Secondary | ICD-10-CM | POA: Diagnosis not present

## 2016-05-25 DIAGNOSIS — Z7722 Contact with and (suspected) exposure to environmental tobacco smoke (acute) (chronic): Secondary | ICD-10-CM | POA: Diagnosis not present

## 2016-05-25 DIAGNOSIS — K219 Gastro-esophageal reflux disease without esophagitis: Secondary | ICD-10-CM | POA: Diagnosis not present

## 2016-05-25 DIAGNOSIS — R251 Tremor, unspecified: Secondary | ICD-10-CM | POA: Diagnosis not present

## 2016-05-25 DIAGNOSIS — D509 Iron deficiency anemia, unspecified: Secondary | ICD-10-CM | POA: Diagnosis not present

## 2016-05-25 DIAGNOSIS — F329 Major depressive disorder, single episode, unspecified: Secondary | ICD-10-CM | POA: Insufficient documentation

## 2016-05-25 DIAGNOSIS — Z880 Allergy status to penicillin: Secondary | ICD-10-CM | POA: Insufficient documentation

## 2016-05-25 DIAGNOSIS — Z7409 Other reduced mobility: Secondary | ICD-10-CM

## 2016-05-25 DIAGNOSIS — M542 Cervicalgia: Secondary | ICD-10-CM | POA: Diagnosis not present

## 2016-05-25 DIAGNOSIS — E785 Hyperlipidemia, unspecified: Secondary | ICD-10-CM | POA: Diagnosis not present

## 2016-05-25 DIAGNOSIS — G8929 Other chronic pain: Secondary | ICD-10-CM | POA: Diagnosis not present

## 2016-05-25 DIAGNOSIS — R718 Other abnormality of red blood cells: Secondary | ICD-10-CM

## 2016-05-25 DIAGNOSIS — F419 Anxiety disorder, unspecified: Secondary | ICD-10-CM | POA: Diagnosis not present

## 2016-05-25 LAB — LACTATE DEHYDROGENASE: LDH: 217 U/L — ABNORMAL HIGH (ref 98–192)

## 2016-05-25 LAB — SEDIMENTATION RATE: Sed Rate: 32 mm/hr — ABNORMAL HIGH (ref 0–22)

## 2016-05-25 LAB — RETICULOCYTES
RBC.: 4.58 MIL/uL (ref 3.87–5.11)
Retic Count, Absolute: 41.2 10*3/uL (ref 19.0–186.0)
Retic Ct Pct: 0.9 % (ref 0.4–3.1)

## 2016-05-25 LAB — FOLATE: Folate: 16.8 ng/mL (ref >5.9)

## 2016-05-25 LAB — C-REACTIVE PROTEIN: CRP: 0.6 mg/dL (ref <1.0)

## 2016-05-25 LAB — FERRITIN: Ferritin: 87 ng/mL (ref 11–307)

## 2016-05-25 NOTE — Patient Instructions (Addendum)
[place] at [facility] Discharge Instructions  RECOMMENDATIONS MADE BY THE CONSULTANT AND ANY TEST RESULTS WILL BE SENT TO YOUR REFERRING PHYSICIAN.  You saw Dr. Whitney Muse today. Follow up in 3 weeks.  Thank you for choosing [place] at [facility] to provide your oncology and hematology care.  To afford each patient quality time with our provider, please arrive at least 15 minutes before your scheduled appointment time.   Beginning January 23rd 2017 lab work for the [facility] will be done in the  Main lab at [facility] on 1st floor. If you have a lab appointment with the [facility] please come in thru the  Main Entrance and check in at the main information desk  You need to re-schedule your appointment should you arrive 10 or more minutes late.  We strive to give you quality time with our providers, and arriving late affects you and other patients whose appointments are after yours.  Also, if you no show three or more times for appointments you may be dismissed from the clinic at the providers discretion.     Again, thank you for choosing [facility].  Our hope is that these requests will decrease the amount of time that you wait before being seen by our physicians.       _____________________________________________________________  Should you have questions after your visit to [facility], please contact our office at [phone] between the hours of 8:30 a.m. and 4:30 p.m.  Voicemails left after 4:30 p.m. will not be returned until the following business day.  For prescription refill requests, have your pharmacy contact our office.         Resources For Cancer Patients and their Caregivers ? American Cancer Society: Can assist with transportation, wigs, general needs, runs Look Good Feel Better.        (606) 203-1915 ? Cancer Care: Provides financial assistance, online support groups,  medication/co-pay assistance.  1-800-813-HOPE 830-531-3127) ? [facility] Assists [place] cancer patients and their families through emotional , educational and financial support.  (503)324-5093 ? [place] DSS Where to apply for food stamps, Medicaid and utility assistance. 870-491-8479 ? [facility]: Transportation to medical appointments. 612-655-3028 ? Social Security Administration: May apply for disability if have a Stage IV cancer. (757)386-9536 (901)387-2526 ? [employer], Disability and Transit Services: Assists with nutrition, care and transit needs. [place] Support Programs: @10RELATIVEDAYS @ > Cancer Support Group  2nd Tuesday of the month 1pm-2pm, Journey Room  > Creative Journey  3rd Tuesday of the month 1130am-1pm, Journey Room  > Look Good Feel Better  1st Wednesday of the month 10am-12 noon, Journey Room (Call [place] to register [phone])

## 2016-05-25 NOTE — Progress Notes (Signed)
[facility] NOTE  Patient Care Team: Laura Helper, MD as PCP - General Laura Pacas, MD as Consulting Physician (Neurology) Laura Shin, MD as Consulting Physician (Endocrinology) Laura Spring, MD as Consulting Physician ([place]) Laura Smoker, LCSW as Social Worker (Psychiatry) Laura Jacks, MD as Consulting Physician (Ophthalmology)  CHIEF COMPLAINTS/PURPOSE OF CONSULTATION:  Microcytic Anemia  HISTORY OF PRESENTING ILLNESS:  Laura Atkins 75 y.o. female is here on referral by Dr. Moshe Atkins for evaluation of anemia. She has had an anemia dating back to at least 2013. Most recent labs from 03/12/2016 show a hemoglobin of 9.6 gm/dl, hct 31.4, mcv of 68.6. Ferritin at the end of May was 76, iron saturation at 14%. B12 was WNL. TSH is WNL.  Chart review in [facility] shows longstanding microcytosis dating back to 2008, intermittent anemia was noted then as well but counts were in the 10 to 11 gm/dl range.  Last colonoscopy was in 2016 with Dr. Laural Atkins, there was an ulcerated lesion next to the ileocecal valve. Biopsies showed ulcerated colonic mucosa with no evidence of malignancy.   Laura Atkins is accompanied by her husband, son, and her son's friend, Laura Atkins. Patient presents in wheelchair.  She has been told before that she is anemic. This anemia has gotten worse recently and is why she was referred to our clinic by Dr. Moshe Atkins.   She went to [facility] a few years ago and was given a blood transfusion.   She has not noticed blood in her stool. However, she occasionally sees blood in her urine which concerns her.  She has a good appetite, but has been trying for it to be less. She knows she could do better but working on it is hard. She has been buying less sweets and diet sodas.   States, "I do care about myself". She prefers not to focus on her health issues, stating that is the only way she knows how to survive.   She is in a wheelchair today because  "something's happening". She reports losing strength in her legs. She also is experiencing back pain, though not all the time. The strength going from her legs scares her. This decreased strength began a couple months ago. Her husband noticed her getting weaker over the last 12 months or so, further explaining it has been ongoing for the last 10 years. She is not sure why the weakness is happening.  She talked to Dr. Moshe Atkins about this leg weakness on 05/23/2016. She was referred to physical therapy in [place] twice weekly for 6 weeks. [facility] Her husband encourages her to exercise by walking up and down the hallway at home. She does not feel comfortable doing this.   She uses a walker sometimes at home. She is able to bathe herself with a chair in the shower. She talks about falling at church one time and needing to be helped up. She talks of another time she fell and injured her hand.  When the weather changed this week, "I feel like I got a cold sometime".   She reports a sore spot on her ankle, on the heel, which is black. She sees Laura Atkins, the foot doctor.   MEDICAL HISTORY:  Past Medical History:  Diagnosis Date  . Anemia   . Anxiety   . Anxiety and depression   . Depression   . Diabetes mellitus   . GERD (gastroesophageal reflux disease)   .  Headache(784.0)   . Hyperlipidemia   . Hypertension   . Obesity   . Obstructive sleep apnea   . Osteoarthritis    Left knee; right shoulder; chronic neck and back pain  . Pruritus   . Seizures (Laura Atkins)   . Tremor    This started months ago after her seizure progressing to very poor hand writing  . Urinary incontinence     SURGICAL HISTORY: Past Surgical History:  Procedure Laterality Date  . ABDOMINAL HYSTERECTOMY    . BREAST EXCISIONAL BIOPSY     Left; cyst  . CATARACT EXTRACTION W/ INTRAOCULAR LENS IMPLANT Left 09/07/2013  . CHOLECYSTECTOMY    . COLONOSCOPY    . COLONOSCOPY N/A 07/20/2015    Procedure: COLONOSCOPY;  Surgeon: Laura Houston, MD;  Location: [facility];  Service: Endoscopy;  Laterality: N/A;  930  . EYE SURGERY Left 09/07/2013   cataract    SOCIAL HISTORY: Social History   Social History  . Marital status: Married    Spouse name: Laura Atkins  . Number of children: 5  . Years of education: 12   Occupational History  . Disabled  Retired   Social History Main Topics  . Smoking status: Never Atkins  . Smokeless tobacco: Never Used  . Alcohol use No  . Drug use: No  . Sexual activity: Yes    Birth control/ protection: Surgical   Other Topics Concern  . Not on file   Social History Narrative   Patient lives at home with her husband Laura Atkins).  Patient is retired.    Right handed.    Five Children.    Caffeine- 2 daily   Married 54 years 5 children 10 or 11 grandchildren Non Atkins. Second hand smoke from her husband. She enjoys watching the stories on television. She worked at [employer].   FAMILY HISTORY: Family History  Problem Relation Age of Onset  . Cancer Mother 23    lung   . Kidney disease Father   . Diabetes Sister   . ADD / ADHD Grandchild   . Bipolar disorder Grandchild   . Bipolar disorder Daughter   . Alcohol abuse Neg Hx   . Drug abuse Neg Hx    Mother deceased. She is not sure how long ago she passed. Was blind and had lung cancer. Father deceased. He died when she was young.  1 brother and 1 sister. All 7 years apart. They live outside of [place]. One aunt had thyroid cancer and another aunt had stomach cancer.  ALLERGIES:  is allergic to penicillins; prednisone; and propoxyphene n-acetaminophen.  MEDICATIONS:  Current Outpatient Prescriptions  Medication Sig Dispense Refill  . acetaminophen (EXTRA STRENGTH PAIN RELIEF) 500 MG tablet Take 500 mg by mouth every 6 (six) hours as needed for pain.    Marland Kitchen amLODipine (NORVASC) 10 MG tablet TAKE 1 TABLET BY MOUTH EVERY DAY 90 tablet 0  . aspirin 81 MG  tablet Take 81 mg by mouth daily.      Marland Kitchen atorvastatin (LIPITOR) 40 MG tablet TAKE 1 TABLET BY MOUTH EVERY DAY 90 tablet 0  . benazepril (LOTENSIN) 20 MG tablet TAKE 1 TABLET BY MOUTH EVERY DAY 90 tablet 0  . diphenhydrAMINE (BENADRYL) 25 mg capsule Take 25 mg by mouth every 6 (six) hours as needed. Reported on 10/18/2015    . gabapentin (NEURONTIN) 400 MG capsule TAKE 1 CAPSULE BY MOUTH TWICE DAILY AND 2 CAPSULES AT BEDTIME 120 capsule 4  . HYDROcodone-acetaminophen ([place])  7.5-325 MG tablet 1 tablet. 1 every 4-6 hours as needed  0  . insulin NPH-regular Human (NOVOLIN 70/30) (70-30) 100 UNIT/ML injection 50 units with breakfast and 25 units with supper, and syringes 2/day. 20 mL 11  . Multiple Vitamins-Minerals (CENTRUM SILVER ULTRA WOMENS PO) Take 1 tablet by mouth daily. Reported on 02/07/2016    . naproxen (NAPROSYN) 500 MG tablet TAKE 1 TABLET BY MOUTH TWICE DAILY WITH A MEAL 30 tablet 0  . omeprazole (PRILOSEC) 20 MG capsule TAKE 1 CAPSULE BY MOUTH DAILY. 90 capsule 0  . ONE TOUCH ULTRA TEST test strip USE TO TEST BLOOD SUGAR FOUR TIMES DAILY 150 each 2  . oxyCODONE-acetaminophen (PERCOCET/ROXICET) 5-325 MG tablet Take 1 tablet by mouth every 8 (eight) hours as needed for severe pain. 5 tablet 0  . potassium chloride SA (K-DUR,KLOR-CON) 20 MEQ tablet TAKE 1 TABLET BY MOUTH TWICE DAILY 60 tablet 5  . sertraline (ZOLOFT) 100 MG tablet TAKE 1 TABLET BY MOUTH TWICE DAILY 60 tablet 2  . torsemide (DEMADEX) 20 MG tablet TAKE 2 TABLETS BY MOUTH TWICE DAILY (STOP LASIX) **WILL NEED TO MAKE A DR APPOINTMENT FOR FURTHER REFILLS** 60 tablet 3  . Vitamin D, Ergocalciferol, (DRISDOL) 50000 units CAPS capsule Take 1 capsule (50,000 Units total) by mouth every 7 (seven) days. 4 capsule 5  . NOVOLIN 70/30 RELION (70-30) 100 UNIT/ML injection INJECT 50 UNITS WITH BREAKFAST AND 20 UNITS WITH SUPPER 30 mL 11   No current facility-administered medications for this visit.     Review of Systems  Constitutional:         Decreased leg strength which began a couple months ago  HENT: Negative.   Eyes: Negative.   Respiratory: Negative.   Cardiovascular: Negative.   Gastrointestinal: Negative.  Negative for blood in stool.  Genitourinary: Positive for hematuria.       Occasional hematuria  Musculoskeletal: Positive for back pain, falls and joint pain.       Intermittent back pain  Skin: Negative.   Neurological: Positive for weakness.  Endo/Heme/Allergies: Negative.   Psychiatric/Behavioral: Negative.   All other systems reviewed and are negative.  14 point ROS was done and is otherwise as detailed above or in HPI   PHYSICAL EXAMINATION: ECOG PERFORMANCE STATUS: 2 - Symptomatic, <50% confined to bed  Vitals - 1 value per visit 99991111  SYSTOLIC 0000000  DIASTOLIC 80  Pulse 89  Temperature   Respirations 16  Weight (lb) 296  Height 5\' 8"   BMI 45.01    Physical Exam  Constitutional: She is oriented to person, place, and time and well-developed, well-nourished, and in no distress.  Obese. In wheelchair. Only able to get on exam table with much assistance.  HENT:  Head: Normocephalic and atraumatic.  Mouth/Throat: Oropharynx is clear and moist.  Eyes: Conjunctivae and EOM are normal. Pupils are equal, round, and reactive to light. Right eye exhibits no discharge. Left eye exhibits no discharge. No scleral icterus.  Neck: Normal range of motion. Neck supple. No tracheal deviation present. No thyromegaly present.  Cardiovascular: Normal rate, regular rhythm and normal heart sounds.   Pulmonary/Chest: Effort normal and breath sounds normal. No respiratory distress. She has no wheezes. She has no rales. She exhibits no tenderness.  Abdominal: Soft. Bowel sounds are normal. She exhibits no distension and no mass. There is no tenderness. There is no rebound and no guarding.  Patient unable to lie back on the exam table   Musculoskeletal: Normal range of motion.  Lymphadenopathy:    She has no  cervical adenopathy.  Neurological: She is alert and oriented to person, place, and time. No cranial nerve deficit.  She is unable to stand on her own without assistance.  Skin: Skin is warm and dry.  Nursing note and vitals reviewed.   LABORATORY DATA:  I have reviewed the data as listed Lab Results  Component Value Date   WBC 5.8 03/12/2016   HGB 9.6 (L) 03/12/2016   HCT 31.4 (L) 03/12/2016   MCV 68.6 (L) 03/12/2016   PLT 207 03/12/2016   CMP     Component Value Date/Time   NA 139 05/23/2016 1158   K 4.6 05/23/2016 1158   CL 101 05/23/2016 1158   CO2 29 05/23/2016 1158   GLUCOSE 177 (H) 05/23/2016 1158   BUN 21 05/23/2016 1158   CREATININE 1.04 (H) 05/23/2016 1158   CALCIUM 9.5 05/23/2016 1158   PROT 7.5 02/21/2016 1001   ALBUMIN 4.3 02/21/2016 1001   AST 19 02/21/2016 1001   ALT 22 02/21/2016 1001   ALKPHOS 109 02/21/2016 1001   BILITOT 0.4 02/21/2016 1001   GFRNONAA 54 (L) 05/23/2016 1158   GFRAA 62 05/23/2016 1158   PATHOLOGY:    RADIOGRAPHIC STUDIES: I have personally reviewed the radiological images as listed and agreed with the findings in the report. No results found.  ASSESSMENT & PLAN:  Microcytic anemia, longstanding Worsening anemia Inability to ambulate Deconditioning  I certainly suspect a hemoglobinopathy or thalassemia given her longstanding significant microcytosis and mild anemia. The cause of her worsening anemia at this point is not completely clear. We will start with a peripheral evaluation today, she is agreeable.   Orders Placed This Encounter  Procedures  . Haptoglobin  . Hemoglobinopathy evaluation  . Lactate dehydrogenase  . Folate  . Sedimentation rate  . C-reactive protein  . Reticulocytes  . IgG, IgA, IgM  . Erythropoietin  . ANA, IFA (with reflex)  . Kappa/lambda light chains  . Immunofixation electrophoresis  . Protein electrophoresis, serum  . Ferritin  . FANA Staining Patterns    Last colonoscopy on 07/20/2015  with Dr. Laural Atkins.  The patient complains of progressive leg weakness over the last couple of months. She talked to Dr. Moshe Atkins about this leg weakness on 05/23/2016. At this visit, she was referred to physical therapy in [place] twice weekly for 6 weeks and [facility] Griffin Dakin note reviewed. Patient also has OA.  Phone number given by son, [phone]  RTC 3 weeks with labs.   All questions were answered. The patient knows to call the clinic with any problems, questions or concerns.  This document serves as a record of services personally performed by Ancil Linsey, MD. It was created on her behalf by Arlyce Harman, a trained medical scribe. The creation of this record is based on the scribe's personal observations and the provider's statements to them. This document has been checked and approved by the attending provider.  I have reviewed the above documentation for accuracy and completeness and I agree with the above.  This note was electronically signed.    Molli Hazard, MD  06/03/2016 9:08 PM

## 2016-05-26 LAB — IGG, IGA, IGM
IgA: 320 mg/dL (ref 64–422)
IgG (Immunoglobin G), Serum: 1330 mg/dL (ref 700–1600)
IgM, Serum: 94 mg/dL (ref 26–217)

## 2016-05-26 LAB — HAPTOGLOBIN: Haptoglobin: 114 mg/dL (ref 34–200)

## 2016-05-27 DIAGNOSIS — Z7409 Other reduced mobility: Secondary | ICD-10-CM | POA: Insufficient documentation

## 2016-05-27 NOTE — Assessment & Plan Note (Signed)
Increasingly limited in mobility, die to severe osteoarthritis, generalized as well as morbid obesity and deconditioning, refer to pT twice weekly for 6 weeks

## 2016-05-27 NOTE — Progress Notes (Signed)
Laura Atkins     MRN: [id number]      DOB: 1940/12/07   HPI Laura Atkins is here for follow up and re-evaluation of chronic medical conditions, medication management and review of any available recent lab and radiology data.  Preventive health is updated, specifically  Cancer screening and Immunization.   Questions or concerns regarding consultations or procedures which the PT has had in the interim are  addressed. The PT denies any adverse reactions to current medications since the last visit.  There are no new concerns.  Denies polyuria, polydipsia, blurred vision , or hypoglycemic episodes. C/o reduced mobility, chronic fatigue, not getting out as she used to, requests and needs PT to improve this  ROS Denies recent fever or chills. Denies sinus pressure, nasal congestion, ear pain or sore throat. Denies chest congestion, productive cough or wheezing. Denies chest pains, palpitations and leg swelling Denies abdominal pain, nausea, vomiting,diarrhea or constipation.   Denies dysuria, frequency, hesitancy or incontinence. Denies headaches, seizures, . Denies uncontrolled  depression, anxiety or insomnia. Denies skin break down or rash.   PE  BP 138/80   Pulse 89   Resp 16   Ht 5\' 8"  (1.727 m)   Wt 296 lb (134.3 kg)   SpO2 95%   BMI 45.01 kg/m   Patient alert and oriented and in no cardiopulmonary distress.  HEENT: No facial asymmetry, EOMI,   oropharynx pink and moist.  Neck decreased ROM, no JVD, no mass.  Chest: Clear to auscultation bilaterally.  CVS: S1, S2 no murmurs, no S3.Regular rate.  ABD: Soft non tender.   Ext: No edema  MS: Markedly Decreased  ROM spine, shoulders, hips and knees.  Skin: Intact, no ulcerations or rash noted.  Psych: Good eye contact, normal affect. Memory intact not anxious or depressed appearing.  CNS: CN 2-12 intact, power,  normal throughout.no focal deficits noted.   Assessment & Plan  Essential hypertension Controlled, no  change in medication DASH diet and commitment to daily physical activity for a minimum of 30 minutes discussed and encouraged, as a part of hypertension management. The importance of attaining a healthy weight is also discussed.  BP/Weight 05/23/2016 03/12/2016 02/21/2016 02/03/2016 12/29/2015 12/28/2015 [id number]  Systolic BP 0000000 [id number] 0000000 123456 Q000111Q 123456  Diastolic BP 80 93 64 64 62 60 66  Wt. (Lbs) 296 300 300 301 295 295.1 298  BMI 45.01 45.63 45.63 45.78 44.86 44.88 46.66  Some encounter information is confidential and restricted. Go to Review Flowsheets activity to see all data.       Diabetes mellitus type 2 in obese (HCC) Improved. Laura Atkins is reminded of the importance of commitment to daily physical activity for 30 minutes or more, as able and the need to limit carbohydrate intake to 30 to 60 grams per meal to help with blood sugar control.   The need to take medication as prescribed, test blood sugar as directed, and to call between visits if there is a concern that blood sugar is uncontrolled is also discussed.   Laura Atkins is reminded of the importance of daily foot exam, annual eye examination, and good blood sugar, blood pressure and cholesterol control. Schedule and keep appt with endo  Diabetic Labs Latest Ref Rng & Units 05/23/2016 03/12/2016 02/21/2016 02/03/2016 11/04/2015  HbA1c <5.7 % 7.6(H) - - 8.1 8.2  Microalbumin <2.0 mg/dL - - - - -  Micro/Creat Ratio 0.0 - 30.0 mg/g - - - - -  Chol 125 -  200 mg/dL - - 165 - -  HDL >=46 mg/dL - - 76 - -  Calc LDL <130 mg/dL - - 64 - -  Triglycerides <150 mg/dL - - 123 - -  Creatinine 0.60 - 0.93 mg/dL 1.04(H) 1.31(H) 1.10(H) - -   BP/Weight 05/23/2016 03/12/2016 02/21/2016 02/03/2016 12/29/2015 12/28/2015 [id number]  Systolic BP 0000000 [id number] 0000000 123456 Q000111Q 123456  Diastolic BP 80 93 64 64 62 60 66  Wt. (Lbs) 296 300 300 301 295 295.1 298  BMI 45.01 45.63 45.63 45.78 44.86 44.88 46.66  Some encounter information is confidential and restricted. Go to  Review Flowsheets activity to see all data.   Foot/eye exam completion dates Latest Ref Rng & Units 02/03/2016 07/29/2015  Eye Exam No Retinopathy - -  Foot Form Completion - Done Done        Anxiety and depression Increased and uncontrolled, due to ill health of one daughter and the fact that her grand daughter is no longer living with her, she states this was 'not in the plan", she is encouraged to return to therapy which was extremely beneficial in the past  Morbid obesity improved Patient re-educated about  the importance of commitment to a  minimum of 150 minutes of exercise per week.  The importance of healthy food choices with portion control discussed. Encouraged to start a food diary, count calories and to consider  joining a support group. Sample diet sheets offered. Goals set by the patient for the next several months.   Weight /BMI 05/23/2016 03/12/2016 02/21/2016  WEIGHT 296 lb 300 lb 300 lb  HEIGHT 5\' 8"  5\' 8"  5\' 8"   BMI 45.01 kg/m2 45.63 kg/m2 45.63 kg/m2  Some encounter information is confidential and restricted. Go to Review Flowsheets activity to see all data.      Hyperlipemia Hyperlipidemia:Low fat diet discussed and encouraged.   Lipid Panel  Lab Results  Component Value Date   CHOL 165 02/21/2016   HDL 76 02/21/2016   LDLCALC 64 02/21/2016   TRIG 123 02/21/2016   CHOLHDL 2.2 02/21/2016   Controlled, no change in medication     Limited mobility Increasingly limited in mobility, die to severe osteoarthritis, generalized as well as morbid obesity and deconditioning, refer to pT twice weekly for 6 weeks

## 2016-05-27 NOTE — Assessment & Plan Note (Signed)
Controlled, no change in medication DASH diet and commitment to daily physical activity for a minimum of 30 minutes discussed and encouraged, as a part of hypertension management. The importance of attaining a healthy weight is also discussed.  BP/Weight 05/23/2016 03/12/2016 02/21/2016 02/03/2016 12/29/2015 12/28/2015 [id number]  Systolic BP 0000000 [id number] 0000000 123456 [id number] [id number]  Diastolic BP 80 93 64 64 62 60 66  Wt. (Lbs) 296 300 300 301 295 295.1 298  BMI 45.01 45.63 45.63 45.78 44.86 44.88 46.66  Some encounter information is confidential and restricted. Go to Review Flowsheets activity to see all data.

## 2016-05-27 NOTE — Assessment & Plan Note (Signed)
improved Patient re-educated about  the importance of commitment to a  minimum of 150 minutes of exercise per week.  The importance of healthy food choices with portion control discussed. Encouraged to start a food diary, count calories and to consider  joining a support group. Sample diet sheets offered. Goals set by the patient for the next several months.   Weight /BMI 05/23/2016 03/12/2016 02/21/2016  WEIGHT 296 lb 300 lb 300 lb  HEIGHT 5\' 8"  5\' 8"  5\' 8"   BMI 45.01 kg/m2 45.63 kg/m2 45.63 kg/m2  Some encounter information is confidential and restricted. Go to Review Flowsheets activity to see all data.

## 2016-05-27 NOTE — Assessment & Plan Note (Signed)
Improved. Laura Atkins is reminded of the importance of commitment to daily physical activity for 30 minutes or more, as able and the need to limit carbohydrate intake to 30 to 60 grams per meal to help with blood sugar control.   The need to take medication as prescribed, test blood sugar as directed, and to call between visits if there is a concern that blood sugar is uncontrolled is also discussed.   Laura Atkins is reminded of the importance of daily foot exam, annual eye examination, and good blood sugar, blood pressure and cholesterol control. Schedule and keep appt with endo  Diabetic Labs Latest Ref Rng & Units 05/23/2016 03/12/2016 02/21/2016 02/03/2016 11/04/2015  HbA1c <5.7 % 7.6(H) - - 8.1 8.2  Microalbumin <2.0 mg/dL - - - - -  Micro/Creat Ratio 0.0 - 30.0 mg/g - - - - -  Chol 125 - 200 mg/dL - - 165 - -  HDL >=46 mg/dL - - 76 - -  Calc LDL <130 mg/dL - - 64 - -  Triglycerides <150 mg/dL - - 123 - -  Creatinine 0.60 - 0.93 mg/dL 1.04(H) 1.31(H) 1.10(H) - -   BP/Weight 05/23/2016 03/12/2016 02/21/2016 02/03/2016 12/29/2015 12/28/2015 [id number]  Systolic BP 0000000 [id number] 0000000 123456 [id number] 123456  Diastolic BP 80 93 64 64 62 60 66  Wt. (Lbs) 296 300 300 301 295 295.1 298  BMI 45.01 45.63 45.63 45.78 44.86 44.88 46.66  Some encounter information is confidential and restricted. Go to Review Flowsheets activity to see all data.   Foot/eye exam completion dates Latest Ref Rng & Units 02/03/2016 07/29/2015  Eye Exam No Retinopathy - -  Foot Form Completion - Done Done

## 2016-05-27 NOTE — Assessment & Plan Note (Signed)
Increased and uncontrolled, due to ill health of one daughter and the fact that her grand daughter is no longer living with her, she states this was 'not in the plan", she is encouraged to return to therapy which was extremely beneficial in the past

## 2016-05-27 NOTE — Assessment & Plan Note (Signed)
Hyperlipidemia:Low fat diet discussed and encouraged.   Lipid Panel  Lab Results  Component Value Date   CHOL 165 02/21/2016   HDL 76 02/21/2016   LDLCALC 64 02/21/2016   TRIG 123 02/21/2016   CHOLHDL 2.2 02/21/2016   Controlled, no change in medication

## 2016-05-29 ENCOUNTER — Encounter: Payer: Self-pay | Admitting: Neurology

## 2016-05-29 LAB — KAPPA/LAMBDA LIGHT CHAINS
Kappa free light chain: 33.6 mg/L — ABNORMAL HIGH (ref 3.3–19.4)
Kappa, lambda light chain ratio: 1.35 (ref 0.26–1.65)
Lambda free light chains: 24.9 mg/L (ref 5.7–26.3)

## 2016-05-29 LAB — PROTEIN ELECTROPHORESIS, SERUM
A/G Ratio: 1.1 (ref 0.7–1.7)
Albumin ELP: 3.9 g/dL (ref 2.9–4.4)
Alpha-1-Globulin: 0.2 g/dL (ref 0.0–0.4)
Alpha-2-Globulin: 0.5 g/dL (ref 0.4–1.0)
Beta Globulin: 1.3 g/dL (ref 0.7–1.3)
Gamma Globulin: 1.4 g/dL (ref 0.4–1.8)
Globulin, Total: 3.5 g/dL (ref 2.2–3.9)
Total Protein ELP: 7.4 g/dL (ref 6.0–8.5)

## 2016-05-29 LAB — HEMOGLOBINOPATHY EVALUATION
Hgb A2 Quant: 2.1 % (ref 0.7–3.1)
Hgb A: 97.9 % (ref 94.0–98.0)
Hgb C: 0 %
Hgb F Quant: 0 % (ref 0.0–2.0)
Hgb S Quant: 0 %

## 2016-05-29 LAB — IMMUNOFIXATION ELECTROPHORESIS
IgA: 323 mg/dL (ref 64–422)
IgG (Immunoglobin G), Serum: 1337 mg/dL (ref 700–1600)
IgM, Serum: 96 mg/dL (ref 26–217)
Total Protein ELP: 7.4 g/dL (ref 6.0–8.5)

## 2016-05-29 LAB — FANA STAINING PATTERNS: Speckled Pattern: 1:160 {titer} — ABNORMAL HIGH

## 2016-05-29 LAB — ANTINUCLEAR ANTIBODIES, IFA: ANA Ab, IFA: POSITIVE — AB

## 2016-05-30 ENCOUNTER — Telehealth: Payer: Self-pay | Admitting: Family Medicine

## 2016-05-30 LAB — ERYTHROPOIETIN: Erythropoietin: 9.6 m[IU]/mL (ref 2.6–18.5)

## 2016-05-30 NOTE — Telephone Encounter (Signed)
Please call and explain test results

## 2016-05-31 ENCOUNTER — Ambulatory Visit (INDEPENDENT_AMBULATORY_CARE_PROVIDER_SITE_OTHER): Payer: PPO | Admitting: Cardiology

## 2016-05-31 VITALS — BP 136/70 | HR 74 | Ht 68.0 in | Wt 290.4 lb

## 2016-05-31 DIAGNOSIS — E785 Hyperlipidemia, unspecified: Secondary | ICD-10-CM | POA: Diagnosis not present

## 2016-05-31 DIAGNOSIS — I10 Essential (primary) hypertension: Secondary | ICD-10-CM

## 2016-05-31 NOTE — Telephone Encounter (Signed)
Patient aware of her results 

## 2016-05-31 NOTE — Progress Notes (Signed)
Clinical Summary Laura Atkins is a 75 y.o.female seen today for follow up of the following medical problems.   1. Leg swelling - weight down 6 lbs since last visit. Swelling continues to improve. She is taking torsemide 20mg  bid.   2. Leg pain - cramping pain in back of legs with exertion. No rest pain, no sores on feet - ABIs 12/2014 were normal - symptoms unchanged since last visit.   3. HTN - does not check at home - compliant with meds  4. HL - compliant with statin.  Last panel 01/2016: TC 165 TG 123 HDL 76 LDL 64     Past Medical History:  Diagnosis Date  . Anemia   . Anxiety   . Anxiety and depression   . Depression   . Diabetes mellitus   . GERD (gastroesophageal reflux disease)   . Headache(784.0)   . Hyperlipidemia   . Hypertension   . Obesity   . Obstructive sleep apnea   . Osteoarthritis    Left knee; right shoulder; chronic neck and back pain  . Pruritus   . Seizures ([place])   . Tremor    This started months ago after her seizure progressing to very poor hand writing  . Urinary incontinence      Allergies  Allergen Reactions  . Penicillins Shortness Of Breath, Itching and Rash  . Prednisone Shortness Of Breath, Itching and Rash  . Propoxyphene N-Acetaminophen Itching and Nausea And Vomiting     Current Outpatient Prescriptions  Medication Sig Dispense Refill  . acetaminophen (EXTRA STRENGTH PAIN RELIEF) 500 MG tablet Take 500 mg by mouth every 6 (six) hours as needed for pain.    Marland Kitchen amLODipine (NORVASC) 10 MG tablet TAKE 1 TABLET BY MOUTH EVERY DAY 90 tablet 0  . aspirin 81 MG tablet Take 81 mg by mouth daily.      Marland Kitchen atorvastatin (LIPITOR) 40 MG tablet TAKE 1 TABLET BY MOUTH EVERY DAY 90 tablet 0  . benazepril (LOTENSIN) 20 MG tablet TAKE 1 TABLET BY MOUTH EVERY DAY 90 tablet 0  . diphenhydrAMINE (BENADRYL) 25 mg capsule Take 25 mg by mouth every 6 (six) hours as needed. Reported on 10/18/2015    . gabapentin (NEURONTIN) 400 MG capsule TAKE 1  CAPSULE BY MOUTH TWICE DAILY AND 2 CAPSULES AT BEDTIME 120 capsule 4  . HYDROcodone-acetaminophen (NORCO) 7.5-325 MG tablet 1 tablet. 1 every 4-6 hours as needed  0  . insulin NPH-regular Human (NOVOLIN 70/30) (70-30) 100 UNIT/ML injection 50 units with breakfast and 25 units with supper, and syringes 2/day. 20 mL 11  . Multiple Vitamins-Minerals (CENTRUM SILVER ULTRA WOMENS PO) Take 1 tablet by mouth daily. Reported on 02/07/2016    . naproxen (NAPROSYN) 500 MG tablet TAKE 1 TABLET BY MOUTH TWICE DAILY WITH A MEAL 30 tablet 0  . omeprazole (PRILOSEC) 20 MG capsule TAKE 1 CAPSULE BY MOUTH DAILY. 90 capsule 0  . ONE TOUCH ULTRA TEST test strip USE TO TEST BLOOD SUGAR FOUR TIMES DAILY 150 each 2  . oxyCODONE-acetaminophen (PERCOCET/ROXICET) 5-325 MG tablet Take 1 tablet by mouth every 8 (eight) hours as needed for severe pain. 5 tablet 0  . potassium chloride SA (K-DUR,KLOR-CON) 20 MEQ tablet TAKE 1 TABLET BY MOUTH TWICE DAILY 60 tablet 5  . sertraline (ZOLOFT) 100 MG tablet TAKE 1 TABLET BY MOUTH TWICE DAILY 60 tablet 2  . torsemide (DEMADEX) 20 MG tablet TAKE 2 TABLETS BY MOUTH TWICE DAILY (STOP LASIX) **WILL NEED  TO MAKE A DR APPOINTMENT FOR FURTHER REFILLS** 60 tablet 3  . Vitamin D, Ergocalciferol, (DRISDOL) 50000 units CAPS capsule Take 1 capsule (50,000 Units total) by mouth every 7 (seven) days. 4 capsule 5   No current facility-administered medications for this visit.      Past Surgical History:  Procedure Laterality Date  . ABDOMINAL HYSTERECTOMY    . BREAST EXCISIONAL BIOPSY     Left; cyst  . CATARACT EXTRACTION W/ INTRAOCULAR LENS IMPLANT Left 09/07/2013  . CHOLECYSTECTOMY    . COLONOSCOPY    . COLONOSCOPY N/A 07/20/2015   Procedure: COLONOSCOPY;  Surgeon: Rogene Houston, MD;  Location: [facility];  Service: Endoscopy;  Laterality: N/A;  930  . EYE SURGERY Left 09/07/2013   cataract     Allergies  Allergen Reactions  . Penicillins Shortness Of Breath, Itching and Rash    . Prednisone Shortness Of Breath, Itching and Rash  . Propoxyphene N-Acetaminophen Itching and Nausea And Vomiting      Family History  Problem Relation Age of Onset  . Cancer Mother 64    lung   . Kidney disease Father   . Diabetes Sister   . ADD / ADHD Grandchild   . Bipolar disorder Grandchild   . Bipolar disorder Daughter   . Alcohol abuse Neg Hx   . Drug abuse Neg Hx      Social History Laura Atkins reports that she has never smoked. She has never used smokeless tobacco. Laura Atkins reports that she does not drink alcohol.   Review of Systems CONSTITUTIONAL: No weight loss, fever, chills, weakness or fatigue.  HEENT: Eyes: No visual loss, blurred vision, double vision or yellow sclerae.No hearing loss, sneezing, congestion, runny nose or sore throat.  SKIN: No rash or itching.  CARDIOVASCULAR: per HPI RESPIRATORY: No shortness of breath, cough or sputum.  GASTROINTESTINAL: No anorexia, nausea, vomiting or diarrhea. No abdominal pain or blood.  GENITOURINARY: No burning on urination, no polyuria NEUROLOGICAL: No headache, dizziness, syncope, paralysis, ataxia, numbness or tingling in the extremities. No change in bowel or bladder control.  MUSCULOSKELETAL: +leg pains LYMPHATICS: No enlarged nodes. No history of splenectomy.  PSYCHIATRIC: No history of depression or anxiety.  ENDOCRINOLOGIC: No reports of sweating, cold or heat intolerance. No polyuria or polydipsia.  Marland Kitchen   Physical Examination Vitals:   05/31/16 0916  BP: 136/70  Pulse: 74   Vitals:   05/31/16 0916  Weight: 290 lb 6.4 oz (131.7 kg)  Height: 5\' 8"  (1.727 m)    Gen: resting comfortably, no acute distress HEENT: no scleral icterus, pupils equal round and reactive, no palptable cervical adenopathy,  CV: RRR, no m/r/g, no jvd Resp: Clear to auscultation bilaterally GI: abdomen is soft, non-tender, non-distended, normal bowel sounds, no hepatosplenomegaly MSK: extremities are warm,1+ bilateral LE  edema Skin: warm, no rash Neuro:  no focal deficits Psych: appropriate affect   Diagnostic Studies 01/2013 echo Study Conclusions  - Left ventricle: The cavity size was normal. There was mild concentric hypertrophy. Systolic function was normal. The estimated ejection fraction was in the range of 60% to 65%. Wall motion was normal; there were no regional wall motion abnormalities. - Aortic valve: Mildly calcified annulus. - Right ventricle: The cavity size was normal. Wall thickness was mildly increased. - Atrial septum: No defect or patent foramen ovale was identified.  01/2013 MPI IMPRESSION: Probably negative pharmacologic stress nuclear myocardial study revealing normal left ventricular size, normal left ventricular systolic function and no significant stress-induced  EKG abnormalities. On scintigraphic imaging, there was a small and mild defect that likely represents variable breast attenuation; however, the possibility of modest anterior ischemia cannot be unequivocally excluded.  01/2013 Event monitor No significant arrhythmias  12/2014 ABIs: R 1.10 L 1.15  02/2015 Carotid US 1-39% bilateral stenosis    Assessment and Plan  1. Chronic LE edema - echo 01/2013 with normal LVEF, diastolic function not reported but fairly normal parameters - symptoms improved with change to torsemide. We will continue current diurteics.    2. HTN - at goal, she will continue current meds  3. Hyperlipidemia - at goal,she will continue current meds   EKG in clinic today shows NSR.   F/u 6 months      Arnoldo Lenis, M.D.

## 2016-05-31 NOTE — Patient Instructions (Signed)

## 2016-06-01 ENCOUNTER — Encounter: Payer: Self-pay | Admitting: Cardiology

## 2016-06-02 ENCOUNTER — Other Ambulatory Visit: Payer: Self-pay | Admitting: Endocrinology

## 2016-06-03 ENCOUNTER — Encounter (HOSPITAL_COMMUNITY): Payer: Self-pay | Admitting: Hematology & Oncology

## 2016-06-05 ENCOUNTER — Telehealth: Payer: Self-pay | Admitting: Neurology

## 2016-06-11 ENCOUNTER — Encounter: Payer: Self-pay | Admitting: Endocrinology

## 2016-06-11 ENCOUNTER — Ambulatory Visit (INDEPENDENT_AMBULATORY_CARE_PROVIDER_SITE_OTHER): Payer: PPO | Admitting: Endocrinology

## 2016-06-11 VITALS — BP 138/70 | HR 83 | Wt 287.0 lb

## 2016-06-11 DIAGNOSIS — E119 Type 2 diabetes mellitus without complications: Secondary | ICD-10-CM | POA: Diagnosis not present

## 2016-06-11 DIAGNOSIS — E669 Obesity, unspecified: Secondary | ICD-10-CM

## 2016-06-11 DIAGNOSIS — E1169 Type 2 diabetes mellitus with other specified complication: Secondary | ICD-10-CM

## 2016-06-11 MED ORDER — INSULIN NPH ISOPHANE & REGULAR (70-30) 100 UNIT/ML ~~LOC~~ SUSP: SUBCUTANEOUS | 11 refills | Status: DC

## 2016-06-11 NOTE — Progress Notes (Signed)
Subjective:    Patient ID: Laura Atkins, female    DOB: 06/27/1941, 75 y.o.   MRN: [id number]  HPI Pt returns for f/u of diabetes mellitus: DM type: insulin-requiring type 2.   Dx'ed: 7893 Complications: polyneuropathy, PAD, and foot ulcer.   Therapy: insulin since 2005.   GDM: never.  DKA: never Severe hypoglycemia: never.  Pancreatitis: never.   Other info: in early 2016, she changed to BID premixed insulin, due to poor results with multiple daily injections.  She takes human insulin, due to cost.   Interval history: no cbg record, but states cbg's vary from 92-300.  It is in general higher as the day goes on.  She seldom misses the insulin.  She seldom has hypoglycemia, and these episodes are mild.  This usually happens fasting.   Past Medical History:  Diagnosis Date  . Anemia   . Anxiety   . Anxiety and depression   . Depression   . Diabetes mellitus   . GERD (gastroesophageal reflux disease)   . Headache(784.0)   . Hyperlipidemia   . Hypertension   . Obesity   . Obstructive sleep apnea   . Osteoarthritis    Left knee; right shoulder; chronic neck and back pain  . Pruritus   . Seizures ([place])   . Tremor    This started months ago after her seizure progressing to very poor hand writing  . Urinary incontinence     Past Surgical History:  Procedure Laterality Date  . ABDOMINAL HYSTERECTOMY    . BREAST EXCISIONAL BIOPSY     Left; cyst  . CATARACT EXTRACTION W/ INTRAOCULAR LENS IMPLANT Left 09/07/2013  . CHOLECYSTECTOMY    . COLONOSCOPY    . COLONOSCOPY N/A 07/20/2015   Procedure: COLONOSCOPY;  Surgeon: Rogene Houston, MD;  Location[facility];  Service: Endoscopy;  Laterality: N/A;  930  . EYE SURGERY Left 09/07/2013   cataract    Social History   Social History  . Marital status: Married    Spouse name: saunders  . Number of children: 5  . Years of education: 12   Occupational History  . Disabled  Retired   Social History Main Topics  .  Smoking status: Never Smoker  . Smokeless tobacco: Never Used  . Alcohol use No  . Drug use: No  . Sexual activity: Yes    Birth control/ protection: Surgical   Other Topics Concern  . Not on file   Social History Narrative   Patient lives at home with her husband [name].  Patient is retired.    Right handed.    Five Children.    Caffeine- 2 daily    Current Outpatient Prescriptions on File Prior to Visit  Medication Sig Dispense Refill  . acetaminophen (EXTRA STRENGTH PAIN RELIEF) 500 MG tablet Take 500 mg by mouth every 6 (six) hours as needed for pain.    Marland Kitchen amLODipine (NORVASC) 10 MG tablet TAKE 1 TABLET BY MOUTH EVERY DAY 90 tablet 0  . aspirin 81 MG tablet Take 81 mg by mouth daily.      Marland Kitchen atorvastatin (LIPITOR) 40 MG tablet TAKE 1 TABLET BY MOUTH EVERY DAY 90 tablet 0  . benazepril (LOTENSIN) 20 MG tablet TAKE 1 TABLET BY MOUTH EVERY DAY 90 tablet 0  . diphenhydrAMINE (BENADRYL) 25 mg capsule Take 25 mg by mouth every 6 (six) hours as needed. Reported on 10/18/2015    . gabapentin (NEURONTIN) 400 MG capsule TAKE 1 CAPSULE BY  MOUTH TWICE DAILY AND 2 CAPSULES AT BEDTIME 120 capsule 4  . HYDROcodone-acetaminophen (NORCO) 7.5-325 MG tablet 1 tablet. 1 every 4-6 hours as needed  0  . Multiple Vitamins-Minerals (CENTRUM SILVER ULTRA WOMENS PO) Take 1 tablet by mouth daily. Reported on 02/07/2016    . naproxen (NAPROSYN) 500 MG tablet TAKE 1 TABLET BY MOUTH TWICE DAILY WITH A MEAL 30 tablet 0  . omeprazole (PRILOSEC) 20 MG capsule TAKE 1 CAPSULE BY MOUTH DAILY. 90 capsule 0  . ONE TOUCH ULTRA TEST test strip USE TO TEST BLOOD SUGAR FOUR TIMES DAILY 150 each 2  . oxyCODONE-acetaminophen (PERCOCET/ROXICET) 5-325 MG tablet Take 1 tablet by mouth every 8 (eight) hours as needed for severe pain. 5 tablet 0  . potassium chloride SA (K-DUR,KLOR-CON) 20 MEQ tablet TAKE 1 TABLET BY MOUTH TWICE DAILY 60 tablet 5  . sertraline (ZOLOFT) 100 MG tablet TAKE 1 TABLET BY MOUTH TWICE DAILY 60 tablet  2  . torsemide (DEMADEX) 20 MG tablet TAKE 2 TABLETS BY MOUTH TWICE DAILY (STOP LASIX) **WILL NEED TO MAKE A DR APPOINTMENT FOR FURTHER REFILLS** 60 tablet 3  . Vitamin D, Ergocalciferol, (DRISDOL) 50000 units CAPS capsule Take 1 capsule (50,000 Units total) by mouth every 7 (seven) days. 4 capsule 5   No current facility-administered medications on file prior to visit.     Allergies  Allergen Reactions  . Penicillins Shortness Of Breath, Itching and Rash  . Prednisone Shortness Of Breath, Itching and Rash  . Propoxyphene N-Acetaminophen Itching and Nausea And Vomiting    Family History  Problem Relation Age of Onset  . Cancer Mother 67    lung   . Kidney disease Father   . Diabetes Sister   . ADD / ADHD Grandchild   . Bipolar disorder Grandchild   . Bipolar disorder Daughter   . Alcohol abuse Neg Hx   . Drug abuse Neg Hx     BP 138/70 (BP Location: Right Arm, Patient Position: Sitting)   Pulse 83   Wt 287 lb (130.2 kg)   SpO2 97%   BMI 43.64 kg/m    Review of Systems Denies LOC.      Objective:   Physical Exam VITAL SIGNS:  See vs page GENERAL: no distress.  Morbid obesity.  Pulses: dorsalis pedis absent bilat (prob due to edema).   MSK: no deformity of the feet.  CV: 2+ bilat leg edema.  Skin: shallow ulcer on the heel of the right foot, 2 cm diameter (sees podiatry). normal color and temp on the feet.  Neuro: sensation is intact to touch on the feet, but decreased from normal.   SJG:GEZMO is bilateral onychomycosis of the toenails.    Lab Results  Component Value Date   HGBA1C 7.6 (H) 05/23/2016      Assessment & Plan:  Foot ulcer, new to me.  She is advised to watch carefully Insulin-requiring type 2 DM: Based on the pattern of her cbg's, she needs some adjustment in her therapy.

## 2016-06-11 NOTE — Patient Instructions (Addendum)
check your blood sugar 4 times a day: before the 3 meals, and at bedtime.  also check if you have symptoms of your blood sugar being too high or too low.  please keep a record of the readings and bring it to your next appointment here.  You can write it on any piece of paper.  please call us sooner if your blood sugar goes below 70, or if you have a lot of readings over 200.  Please change the insulin to 55 units with breakfast (45 on sundays), and 20 units with supper.  For your safety, take this insulin right before you eat, and only when you eat the first and last meals of the day.  On this type of insulin schedule, you should eat meals on a regular schedule.  If a meal is missed or significantly delayed, your blood sugar could go low.   To help you remember the evening insulin, draw it up when you draw up your breakfast insulin.  It can stay at room temperature.   Please continue to see your foot doctor for the ulcer.   Please come back for a follow-up appointment in 4 months.

## 2016-06-12 ENCOUNTER — Other Ambulatory Visit: Payer: Self-pay | Admitting: Family Medicine

## 2016-06-13 NOTE — Progress Notes (Signed)
[place]  Progress Note  Laura Atkins Care Team: Fayrene Helper, MD as PCP - General Marcial Pacas, MD as Consulting Physician (Neurology) Renato Shin, MD as Consulting Physician (Endocrinology) Cloria Spring, MD as Consulting Physician ([place]) Alonza Smoker, LCSW as Social Worker (Psychiatry) Clent Jacks, MD as Consulting Physician (Ophthalmology)  CHIEF COMPLAINTS/PURPOSE OF CONSULTATION:  Microcytic Anemia  HISTORY OF PRESENTING ILLNESS:  Laura Atkins 75 y.o. female is here on referral by Dr. Moshe Cipro for evaluation of anemia. Laura Atkins has had an anemia dating back to at least 2013. Most recent labs from 03/12/2016 show a hemoglobin of 9.6 gm/dl, hct 31.4, mcv of 68.6. Ferritin at the end of May was 76, iron saturation at 14%. B12 was WNL. TSH is WNL.  Chart review in [facility] shows longstanding microcytosis dating back to 2008, intermittent anemia was noted then as well but counts were in the 10 to 11 gm/dl range.  Last colonoscopy was in 2016 with Dr. [name], there was an ulcerated lesion next to the ileocecal valve. Biopsies showed ulcerated colonic mucosa with no evidence of malignancy.   Laura Atkins is accompanied by Laura Atkins husband, son, and Laura Atkins son's friend, [name]. Laura Atkins presents in wheelchair.  Laura Atkins has been told before that Laura Atkins is anemic. This anemia has gotten worse recently and is why Laura Atkins was referred to our clinic by Dr. Moshe Cipro.   Laura Atkins went to [facility] a few years ago and was given a blood transfusion.   Laura Atkins has no new complaints today. Laura Atkins presents to review prior lab work. Weakness is unchanged, appetite is unchanged.  MEDICAL HISTORY:  Past Medical History:  Diagnosis Date  . Anemia   . Anxiety   . Anxiety and depression   . Depression   . Diabetes mellitus   . GERD (gastroesophageal reflux disease)   . Headache(784.0)   . Hyperlipidemia   . Hypertension   . Obesity   . Obstructive sleep apnea   . Osteoarthritis    Left knee; right  shoulder; chronic neck and back pain  . Pruritus   . Seizures ([place])   . Tremor    This started months ago after Laura Atkins seizure progressing to very poor hand writing  . Urinary incontinence     SURGICAL HISTORY: Past Surgical History:  Procedure Laterality Date  . ABDOMINAL HYSTERECTOMY    . BREAST EXCISIONAL BIOPSY     Left; cyst  . CATARACT EXTRACTION W/ INTRAOCULAR LENS IMPLANT Left 09/07/2013  . CHOLECYSTECTOMY    . COLONOSCOPY    . COLONOSCOPY N/A 07/20/2015   Procedure: COLONOSCOPY;  Surgeon: Rogene Houston, MD;  Location: [facility];  Service: Endoscopy;  Laterality: N/A;  930  . EYE SURGERY Left 09/07/2013   cataract    SOCIAL HISTORY: Social History   Social History  . Marital status: Married    Spouse name: saunders  . Number of children: 5  . Years of education: 12   Occupational History  . Disabled  Retired   Social History Main Topics  . Smoking status: Never Smoker  . Smokeless tobacco: Never Used  . Alcohol use No  . Drug use: No  . Sexual activity: Yes    Birth control/ protection: Surgical   Other Topics Concern  . Not on file   Social History Narrative   Laura Atkins lives at home with Laura Atkins husband [name].  Laura Atkins is retired.    Right handed.    Five Children.    Caffeine- 2  daily   Married 21 years 5 children 10 or 11 grandchildren Non smoker. Second hand smoke from Laura Atkins husband. Laura Atkins enjoys watching the stories on television. Laura Atkins worked at [employer].   FAMILY HISTORY: Family History  Problem Relation Age of Onset  . Cancer Mother 18    lung   . Kidney disease Father   . Diabetes Sister   . ADD / ADHD Grandchild   . Bipolar disorder Grandchild   . Bipolar disorder Daughter   . Alcohol abuse Neg Hx   . Drug abuse Neg Hx    Mother deceased. Laura Atkins is not sure how long ago Laura Atkins passed. Was blind and had lung cancer. Father deceased. He died when Laura Atkins was young.  1 brother and 1 sister. All 7 years apart. They live  outside of [place]. One aunt had thyroid cancer and another aunt had stomach cancer.  ALLERGIES:  is allergic to penicillins; prednisone; and propoxyphene n-acetaminophen.  MEDICATIONS:  Current Outpatient Prescriptions  Medication Sig Dispense Refill  . acetaminophen (EXTRA STRENGTH PAIN RELIEF) 500 MG tablet Take 500 mg by mouth every 6 (six) hours as needed for pain.    Marland Kitchen amLODipine (NORVASC) 10 MG tablet TAKE 1 TABLET BY MOUTH EVERY DAY 90 tablet 0  . aspirin 81 MG tablet Take 81 mg by mouth daily.      Marland Kitchen atorvastatin (LIPITOR) 40 MG tablet TAKE 1 TABLET BY MOUTH EVERY DAY 90 tablet 0  . benazepril (LOTENSIN) 20 MG tablet TAKE 1 TABLET BY MOUTH EVERY DAY 90 tablet 0  . diphenhydrAMINE (BENADRYL) 25 mg capsule Take 25 mg by mouth every 6 (six) hours as needed. Reported on 10/18/2015    . gabapentin (NEURONTIN) 400 MG capsule TAKE 1 CAPSULE BY MOUTH TWICE DAILY AND 2 CAPSULES AT BEDTIME 120 capsule 4  . HYDROcodone-acetaminophen (NORCO) 7.5-325 MG tablet 1 tablet. 1 every 4-6 hours as needed  0  . insulin NPH-regular Human (NOVOLIN 70/30) (70-30) 100 UNIT/ML injection 55 units with breakfast and 20 units with supper, and syringes 2/day. 20 mL 11  . Multiple Vitamins-Minerals (CENTRUM SILVER ULTRA WOMENS PO) Take 1 tablet by mouth daily. Reported on 02/07/2016    . naproxen (NAPROSYN) 500 MG tablet TAKE 1 TABLET BY MOUTH TWICE DAILY WITH A MEAL 30 tablet 0  . omeprazole (PRILOSEC) 20 MG capsule TAKE 1 CAPSULE BY MOUTH EVERY DAY 90 capsule 0  . ONE TOUCH ULTRA TEST test strip USE TO TEST BLOOD SUGAR FOUR TIMES DAILY 150 each 2  . oxyCODONE-acetaminophen (PERCOCET/ROXICET) 5-325 MG tablet Take 1 tablet by mouth every 8 (eight) hours as needed for severe pain. 5 tablet 0  . potassium chloride SA (K-DUR,KLOR-CON) 20 MEQ tablet TAKE 1 TABLET BY MOUTH TWICE DAILY 60 tablet 5  . sertraline (ZOLOFT) 100 MG tablet TAKE 1 TABLET BY MOUTH TWICE DAILY 60 tablet 2  . torsemide (DEMADEX) 20 MG tablet TAKE 2  TABLETS BY MOUTH TWICE DAILY (STOP LASIX) **WILL NEED TO MAKE A DR APPOINTMENT FOR FURTHER REFILLS** 60 tablet 3  . Vitamin D, Ergocalciferol, (DRISDOL) 50000 units CAPS capsule Take 1 capsule (50,000 Units total) by mouth every 7 (seven) days. 4 capsule 5   No current facility-administered medications for this visit.     Review of Systems  Constitutional:       Decreased leg strength which began a couple months ago  HENT: Negative.   Eyes: Negative.   Respiratory: Negative.   Cardiovascular: Negative.   Gastrointestinal: Negative.  Negative for blood in stool.  Genitourinary: Positive for hematuria.       Occasional hematuria  Musculoskeletal: Positive for back pain, falls and joint pain.       Intermittent back pain  Skin: Negative.   Neurological: Positive for weakness.  Endo/Heme/Allergies: Negative.   Psychiatric/Behavioral: Negative.   All other systems reviewed and are negative.  14 point ROS was done and is otherwise as detailed above or in HPI   PHYSICAL EXAMINATION: ECOG PERFORMANCE STATUS: 2 - Symptomatic, <50% confined to bed  Vitals - 1 value per visit 1/74/0814  SYSTOLIC 481  DIASTOLIC 80  Pulse 89  Temperature   Respirations 16  Weight (lb) 296  Height 5\' 8"   BMI 45.01    Physical Exam  Constitutional: Laura Atkins is oriented to person, place, and time and well-developed, well-nourished, and in no distress.  Obese. In wheelchair. Only able to get on exam table with much assistance.  HENT:  Head: Normocephalic and atraumatic.  Mouth/Throat: Oropharynx is clear and moist.  Eyes: Conjunctivae and EOM are normal. Pupils are equal, round, and reactive to light. Right eye exhibits no discharge. Left eye exhibits no discharge. No scleral icterus.  Neck: Normal range of motion. Neck supple. No tracheal deviation present. No thyromegaly present.  Cardiovascular: Normal rate, regular rhythm and normal heart sounds.   Pulmonary/Chest: Effort normal and breath sounds  normal. No respiratory distress. Laura Atkins has no wheezes. Laura Atkins has no rales. Laura Atkins exhibits no tenderness.  Abdominal: Soft. Bowel sounds are normal. Laura Atkins exhibits no distension and no mass. There is no tenderness. There is no rebound and no guarding.  Laura Atkins unable to lie back on the exam table   Musculoskeletal: Normal range of motion.  Lymphadenopathy:    Laura Atkins has no cervical adenopathy.  Neurological: Laura Atkins is alert and oriented to person, place, and time. No cranial nerve deficit.  Laura Atkins is unable to stand on Laura Atkins own without assistance.  Skin: Skin is warm and dry.  Nursing note and vitals reviewed.   LABORATORY DATA:  I have reviewed the data as listed Lab Results  Component Value Date   WBC 5.8 03/12/2016   HGB 9.6 (L) 03/12/2016   HCT 31.4 (L) 03/12/2016   MCV 68.6 (L) 03/12/2016   PLT 207 03/12/2016   CMP     Component Value Date/Time   NA 139 05/23/2016 1158   K 4.6 05/23/2016 1158   CL 101 05/23/2016 1158   CO2 29 05/23/2016 1158   GLUCOSE 177 (H) 05/23/2016 1158   BUN 21 05/23/2016 1158   CREATININE 1.04 (H) 05/23/2016 1158   CALCIUM 9.5 05/23/2016 1158   PROT 7.5 02/21/2016 1001   ALBUMIN 4.3 02/21/2016 1001   AST 19 02/21/2016 1001   ALT 22 02/21/2016 1001   ALKPHOS 109 02/21/2016 1001   BILITOT 0.4 02/21/2016 1001   GFRNONAA 54 (L) 05/23/2016 1158   GFRAA 62 05/23/2016 1158   PATHOLOGY:    RADIOGRAPHIC STUDIES: I have personally reviewed the radiological images as listed and agreed with the findings in the report. No results found.  ASSESSMENT & PLAN:  Microcytic anemia, longstanding Worsening anemia Inability to ambulate Deconditioning  I certainly suspect a hemoglobinopathy or thalassemia given Laura Atkins longstanding significant microcytosis and mild anemia. The cause of Laura Atkins worsening anemia at this point is not completely clear.    ANA was abnormal and I have recommended sending a lupus profile. Given Laura Atkins longstanding microcytosis and anemia, I have also  ordered a thalassemia evaluation.  If these studies are unremarkable will discuss BMBX at return.   Last colonoscopy on 07/20/2015 with Dr. [name].  RTC 2 weeks to review.   All questions were answered. The Laura Atkins knows to call the clinic with any problems, questions or concerns.  This document serves as a record of services personally performed by Ancil Linsey, MD. It was created on Laura Atkins behalf by Arlyce Harman, a trained medical scribe. The creation of this record is based on the scribe's personal observations and the provider's statements to them. This document has been checked and approved by the attending provider.  I have reviewed the above documentation for accuracy and completeness and I agree with the above.  This note was electronically signed.    Molli Hazard, MD  06/13/2016 3:58 PM

## 2016-06-14 ENCOUNTER — Encounter (HOSPITAL_BASED_OUTPATIENT_CLINIC_OR_DEPARTMENT_OTHER): Payer: PPO | Admitting: Hematology & Oncology

## 2016-06-14 ENCOUNTER — Encounter (HOSPITAL_COMMUNITY): Payer: Self-pay | Admitting: Hematology & Oncology

## 2016-06-14 ENCOUNTER — Encounter (HOSPITAL_COMMUNITY): Payer: PPO

## 2016-06-14 ENCOUNTER — Other Ambulatory Visit (HOSPITAL_COMMUNITY): Payer: Self-pay

## 2016-06-14 VITALS — BP 142/50 | HR 70 | Temp 98.1°F | Resp 16

## 2016-06-14 DIAGNOSIS — D509 Iron deficiency anemia, unspecified: Secondary | ICD-10-CM | POA: Diagnosis not present

## 2016-06-14 NOTE — Patient Instructions (Addendum)
[place] at [facility] Discharge Instructions  RECOMMENDATIONS MADE BY THE CONSULTANT AND ANY TEST RESULTS WILL BE SENT TO YOUR REFERRING PHYSICIAN.  You saw Dr. Whitney Muse today. Lab work today. Follow up in 2 weeks with Tom with lab work.  Thank you for choosing [place] at [facility] to provide your oncology and hematology care.  To afford each patient quality time with our provider, please arrive at least 15 minutes before your scheduled appointment time.   Beginning January 23rd 2017 lab work for the [facility] will be done in the  Main lab at [facility] on 1st floor. If you have a lab appointment with the [facility] please come in thru the  Main Entrance and check in at the main information desk  You need to re-schedule your appointment should you arrive 10 or more minutes late.  We strive to give you quality time with our providers, and arriving late affects you and other patients whose appointments are after yours.  Also, if you no show three or more times for appointments you may be dismissed from the clinic at the providers discretion.     Again, thank you for choosing [facility].  Our hope is that these requests will decrease the amount of time that you wait before being seen by our physicians.       _____________________________________________________________  Should you have questions after your visit to [facility], please contact our office at [phone] between the hours of 8:30 a.m. and 4:30 p.m.  Voicemails left after 4:30 p.m. will not be returned until the following business day.  For prescription refill requests, have your pharmacy contact our office.         Resources For Cancer Patients and their Caregivers ? American Cancer Society: Can assist with transportation, wigs, general needs, runs Look Good Feel Better.        (803) 383-2329 ? Cancer Care: Provides financial  assistance, online support groups, medication/co-pay assistance.  1-800-813-HOPE 769-619-4406) ? [facility] Assists [place] cancer patients and their families through emotional , educational and financial support.  6236283135 ? [place] DSS Where to apply for food stamps, Medicaid and utility assistance. 254 801 0062 ? [facility]: Transportation to medical appointments. 959 442 2890 ? Social Security Administration: May apply for disability if have a Stage IV cancer. (830)156-8572 989-017-5543 ? [employer], Disability and Transit Services: Assists with nutrition, care and transit needs. [place] Support Programs: @10RELATIVEDAYS @ > Cancer Support Group  2nd Tuesday of the month 1pm-2pm, Journey Room  > Creative Journey  3rd Tuesday of the month 1130am-1pm, Journey Room  > Look Good Feel Better  1st Wednesday of the month 10am-12 noon, Journey Room (Call [place] to register [phone])

## 2016-06-15 LAB — MISC LABCORP TEST (SEND OUT): Labcorp test code: 56499

## 2016-06-19 DIAGNOSIS — B351 Tinea unguium: Secondary | ICD-10-CM | POA: Diagnosis not present

## 2016-06-19 DIAGNOSIS — L851 Acquired keratosis [keratoderma] palmaris et plantaris: Secondary | ICD-10-CM | POA: Diagnosis not present

## 2016-06-19 DIAGNOSIS — E1342 Other specified diabetes mellitus with diabetic polyneuropathy: Secondary | ICD-10-CM | POA: Diagnosis not present

## 2016-06-19 DIAGNOSIS — M79671 Pain in right foot: Secondary | ICD-10-CM | POA: Diagnosis not present

## 2016-06-19 DIAGNOSIS — L89610 Pressure ulcer of right heel, unstageable: Secondary | ICD-10-CM | POA: Diagnosis not present

## 2016-06-22 ENCOUNTER — Encounter (HOSPITAL_COMMUNITY): Payer: Self-pay | Admitting: Hematology & Oncology

## 2016-06-27 LAB — MISC LABCORP TEST (SEND OUT): Labcorp test code: 511172

## 2016-07-04 ENCOUNTER — Other Ambulatory Visit (HOSPITAL_COMMUNITY): Payer: Self-pay

## 2016-07-04 ENCOUNTER — Ambulatory Visit (HOSPITAL_COMMUNITY): Payer: Self-pay | Admitting: Oncology

## 2016-07-12 ENCOUNTER — Other Ambulatory Visit: Payer: Self-pay | Admitting: Family Medicine

## 2016-07-24 DIAGNOSIS — L89610 Pressure ulcer of right heel, unstageable: Secondary | ICD-10-CM | POA: Diagnosis not present

## 2016-07-24 DIAGNOSIS — M79671 Pain in right foot: Secondary | ICD-10-CM | POA: Diagnosis not present

## 2016-07-24 DIAGNOSIS — E1342 Other specified diabetes mellitus with diabetic polyneuropathy: Secondary | ICD-10-CM | POA: Diagnosis not present

## 2016-08-02 ENCOUNTER — Ambulatory Visit (INDEPENDENT_AMBULATORY_CARE_PROVIDER_SITE_OTHER): Payer: Self-pay | Admitting: Orthopaedic Surgery

## 2016-08-02 ENCOUNTER — Ambulatory Visit (INDEPENDENT_AMBULATORY_CARE_PROVIDER_SITE_OTHER): Payer: PPO | Admitting: Psychiatry

## 2016-08-02 ENCOUNTER — Encounter (HOSPITAL_COMMUNITY): Payer: Self-pay | Admitting: Psychiatry

## 2016-08-02 VITALS — BP 144/85 | HR 75 | Ht 68.0 in

## 2016-08-02 DIAGNOSIS — F329 Major depressive disorder, single episode, unspecified: Secondary | ICD-10-CM

## 2016-08-02 DIAGNOSIS — Z79899 Other long term (current) drug therapy: Secondary | ICD-10-CM | POA: Diagnosis not present

## 2016-08-02 DIAGNOSIS — Z880 Allergy status to penicillin: Secondary | ICD-10-CM

## 2016-08-02 DIAGNOSIS — Z888 Allergy status to other drugs, medicaments and biological substances status: Secondary | ICD-10-CM

## 2016-08-02 DIAGNOSIS — F32A Depression, unspecified: Secondary | ICD-10-CM

## 2016-08-02 MED ORDER — SERTRALINE HCL 100 MG PO TABS: 100.0000 mg | ORAL_TABLET | Freq: Two times a day (BID) | ORAL | 4 refills | Status: DC
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
## 2016-08-06 ENCOUNTER — Ambulatory Visit (INDEPENDENT_AMBULATORY_CARE_PROVIDER_SITE_OTHER): Payer: PPO | Admitting: Psychiatry

## 2016-08-06 ENCOUNTER — Encounter (HOSPITAL_COMMUNITY): Payer: Self-pay | Admitting: Psychiatry

## 2016-08-06 DIAGNOSIS — F32A Depression, unspecified: Secondary | ICD-10-CM

## 2016-08-06 DIAGNOSIS — F329 Major depressive disorder, single episode, unspecified: Secondary | ICD-10-CM | POA: Diagnosis not present

## 2016-08-06 NOTE — Progress Notes (Signed)
      THERAPIST PROGRESS NOTE  Session Time:   Monday 08/06/2016 10:17 AM - 11:02 AM   Participation Level: Active  Behavioral Response: CasualAlert/Anxious  Type of Therapy: Individual Therapy  Treatment Goals addressed: Improve coping skills to alleviate symptoms of depression (isolative behaviors, depressed mood, ruminating thoughts, and negative thinking)  Interventions: Supportive,   Summary: Laura Atkins is a 75 y.o. female who presents with a long-standing history of depression. She was referred to this practice for continuity of care as she was seen at [facility] but was having difficulty scheduling a time to see a therapist. She continues to experience depressed mood and excessive worry   Patient last was seen in July 2016. She is resuming services today per Dr. Alan Ripper recommendation as patient is experiencing increased depressed mood and excessive worry. Patient expresses frustration and sadness as her physical health is declining. She reports problems with a spot on her heel which has spread to her foot due to complications from diabetes. She is experiencing more difficulty with mobility and reports fatigue. She expresses frustration as she becomes tired doing basic self-care like bathing. She reports fearing being home alone. She also reports worry about her children. One of her daughters has multiple health issues and has had several strokes. One of her other daughters recently was diagnosed with breast cancer. She also is having adjustment issues related to her daughter and granddaughter moving from her home a few months ago.    Suicidal/Homicidal: No  Therapist Response: Reviewed symptoms, assisted patient identify stressors, facilitated expression of feelings, began to identify coping statements   Plan: Return again in 2-3 weeks.    Diagnosis: Axis I: Depressive Disorder NOS    Axis II: No diagnosis   Cale Decarolis, LCSW 08/06/2016

## 2016-08-14 DIAGNOSIS — L89610 Pressure ulcer of right heel, unstageable: Secondary | ICD-10-CM | POA: Diagnosis not present

## 2016-08-14 DIAGNOSIS — M79671 Pain in right foot: Secondary | ICD-10-CM | POA: Diagnosis not present

## 2016-08-21 ENCOUNTER — Telehealth: Payer: Self-pay

## 2016-08-21 NOTE — Telephone Encounter (Signed)
Returned patient's call.  Unable to leave message voicemail not set up.

## 2016-08-28 DIAGNOSIS — B351 Tinea unguium: Secondary | ICD-10-CM | POA: Diagnosis not present

## 2016-08-28 DIAGNOSIS — E1342 Other specified diabetes mellitus with diabetic polyneuropathy: Secondary | ICD-10-CM | POA: Diagnosis not present

## 2016-08-28 DIAGNOSIS — L851 Acquired keratosis [keratoderma] palmaris et plantaris: Secondary | ICD-10-CM | POA: Diagnosis not present

## 2016-08-29 ENCOUNTER — Other Ambulatory Visit (HOSPITAL_COMMUNITY): Payer: Self-pay

## 2016-08-29 ENCOUNTER — Ambulatory Visit (HOSPITAL_COMMUNITY): Payer: Self-pay | Admitting: Oncology

## 2016-09-05 ENCOUNTER — Ambulatory Visit (HOSPITAL_COMMUNITY): Payer: Self-pay | Admitting: Psychiatry

## 2016-09-10 ENCOUNTER — Other Ambulatory Visit: Payer: Self-pay | Admitting: Family Medicine

## 2016-09-10 ENCOUNTER — Other Ambulatory Visit: Payer: Self-pay | Admitting: Cardiology

## 2016-09-26 ENCOUNTER — Encounter: Payer: Self-pay | Admitting: Family Medicine

## 2016-10-08 ENCOUNTER — Ambulatory Visit: Payer: Self-pay

## 2016-10-08 ENCOUNTER — Other Ambulatory Visit: Payer: Self-pay | Admitting: Family Medicine

## 2016-10-09 DIAGNOSIS — E1342 Other specified diabetes mellitus with diabetic polyneuropathy: Secondary | ICD-10-CM | POA: Diagnosis not present

## 2016-10-09 DIAGNOSIS — L851 Acquired keratosis [keratoderma] palmaris et plantaris: Secondary | ICD-10-CM | POA: Diagnosis not present

## 2016-10-09 DIAGNOSIS — B351 Tinea unguium: Secondary | ICD-10-CM | POA: Diagnosis not present

## 2016-10-10 ENCOUNTER — Other Ambulatory Visit: Payer: Self-pay | Admitting: Family Medicine

## 2016-10-10 ENCOUNTER — Ambulatory Visit: Payer: Self-pay

## 2016-10-10 ENCOUNTER — Other Ambulatory Visit: Payer: Self-pay | Admitting: Endocrinology

## 2016-10-10 ENCOUNTER — Encounter: Payer: Self-pay | Admitting: Family Medicine

## 2016-10-11 ENCOUNTER — Ambulatory Visit: Payer: Self-pay | Admitting: Endocrinology

## 2016-10-15 ENCOUNTER — Encounter: Payer: Self-pay | Admitting: Family Medicine

## 2016-10-15 ENCOUNTER — Other Ambulatory Visit (HOSPITAL_COMMUNITY)
Admission: RE | Admit: 2016-10-15 | Discharge: 2016-10-15 | Disposition: A | Discharge status: Home / Self Care | Payer: PPO | Source: Other Acute Inpatient Hospital | Attending: Family Medicine | Admitting: Family Medicine

## 2016-10-15 ENCOUNTER — Ambulatory Visit (INDEPENDENT_AMBULATORY_CARE_PROVIDER_SITE_OTHER): Payer: PPO | Admitting: Family Medicine

## 2016-10-15 VITALS — BP 118/74 | HR 75 | Resp 16 | Ht 68.0 in | Wt 279.0 lb

## 2016-10-15 DIAGNOSIS — Z794 Long term (current) use of insulin: Secondary | ICD-10-CM | POA: Diagnosis not present

## 2016-10-15 DIAGNOSIS — E559 Vitamin D deficiency, unspecified: Secondary | ICD-10-CM | POA: Diagnosis not present

## 2016-10-15 DIAGNOSIS — Z0000 Encounter for general adult medical examination without abnormal findings: Secondary | ICD-10-CM

## 2016-10-15 DIAGNOSIS — H9203 Otalgia, bilateral: Secondary | ICD-10-CM

## 2016-10-15 DIAGNOSIS — R32 Unspecified urinary incontinence: Secondary | ICD-10-CM | POA: Insufficient documentation

## 2016-10-15 DIAGNOSIS — M25562 Pain in left knee: Secondary | ICD-10-CM

## 2016-10-15 DIAGNOSIS — E782 Mixed hyperlipidemia: Secondary | ICD-10-CM

## 2016-10-15 DIAGNOSIS — E119 Type 2 diabetes mellitus without complications: Secondary | ICD-10-CM | POA: Diagnosis not present

## 2016-10-15 DIAGNOSIS — N3001 Acute cystitis with hematuria: Secondary | ICD-10-CM

## 2016-10-15 DIAGNOSIS — I10 Essential (primary) hypertension: Secondary | ICD-10-CM | POA: Diagnosis not present

## 2016-10-15 DIAGNOSIS — Z1211 Encounter for screening for malignant neoplasm of colon: Secondary | ICD-10-CM

## 2016-10-15 DIAGNOSIS — IMO0001 Reserved for inherently not codable concepts without codable children: Secondary | ICD-10-CM

## 2016-10-15 DIAGNOSIS — R350 Frequency of micturition: Secondary | ICD-10-CM

## 2016-10-15 DIAGNOSIS — G8929 Other chronic pain: Secondary | ICD-10-CM

## 2016-10-15 LAB — POCT URINALYSIS DIPSTICK
Bilirubin, UA: NEGATIVE
Glucose, UA: NEGATIVE
Ketones, UA: NEGATIVE
Nitrite, UA: NEGATIVE
Protein, UA: NEGATIVE
Spec Grav, UA: 1.015
Urobilinogen, UA: 0.2
pH, UA: 5.5

## 2016-10-15 LAB — LIPID PANEL
Cholesterol: 162 mg/dL (ref <200)
HDL: 68 mg/dL (ref >50)
LDL Cholesterol: 76 mg/dL (ref <100)
Total CHOL/HDL Ratio: 2.4 Ratio (ref <5.0)
Triglycerides: 90 mg/dL (ref <150)
VLDL: 18 mg/dL (ref <30)

## 2016-10-15 LAB — COMPLETE METABOLIC PANEL WITH GFR
ALT: 21 U/L (ref 6–29)
AST: 21 U/L (ref 10–35)
Albumin: 3.8 g/dL (ref 3.6–5.1)
Alkaline Phosphatase: 95 U/L (ref 33–130)
BUN: 23 mg/dL (ref 7–25)
CO2: 28 mmol/L (ref 20–31)
Calcium: 9.3 mg/dL (ref 8.6–10.4)
Chloride: 103 mmol/L (ref 98–110)
Creat: 1.17 mg/dL — ABNORMAL HIGH (ref 0.60–0.93)
GFR, Est African American: 53 mL/min — ABNORMAL LOW (ref >=60)
GFR, Est Non African American: 46 mL/min — ABNORMAL LOW (ref >=60)
Glucose, Bld: 83 mg/dL (ref 65–99)
Potassium: 4.4 mmol/L (ref 3.5–5.3)
Sodium: 141 mmol/L (ref 135–146)
Total Bilirubin: 0.3 mg/dL (ref 0.2–1.2)
Total Protein: 7.2 g/dL (ref 6.1–8.1)

## 2016-10-15 LAB — POC HEMOCCULT BLD/STL (OFFICE/1-CARD/DIAGNOSTIC): Fecal Occult Blood, POC: NEGATIVE

## 2016-10-15 LAB — HEMOGLOBIN A1C
Hgb A1c MFr Bld: 7.6 % — ABNORMAL HIGH (ref <5.7)
Mean Plasma Glucose: 171 mg/dL

## 2016-10-15 MED ORDER — TRAMADOL HCL 50 MG PO TABS
50.0000 mg | ORAL_TABLET | Freq: Two times a day (BID) | ORAL | 0 refills | Status: DC | PRN
Start: 2016-10-15 — End: 2018-01-02

## 2016-10-15 NOTE — Addendum Note (Signed)
Addended by: Eual Fines on: 10/15/2016 01:01 PM   Modules accepted: Orders

## 2016-10-15 NOTE — Assessment & Plan Note (Signed)
Annual exam as documented. Counseling done  re healthy lifestyle involving commitment to 150 minutes exercise per week, heart healthy diet, and attaining healthy weight.The importance of adequate sleep also discussed.  Immunization and cancer screening needs are specifically addressed at this visit.  

## 2016-10-15 NOTE — Patient Instructions (Addendum)
Annual wellness past due, please schedule as soon as possible  MD follow up in 4 months  Eye exam is past due , please keep appt , we will refer once you agree to this, pls also let us know which Doc you want if you have a preference, you are referred to Dr Dennard Schaumann are referred to Dr Aline Brochure   Lpid, cmp and eGFR, hBA1C and vit D today please  Please work on good  health habits so that your health will improve. 1. Commitment to daily physical activity for 30 to 60  minutes, if you are able to do this.  2. Commitment to wise food choices. Aim for half of your  food intake to be vegetable and fruit, one quarter starchy foods, and one quarter protein. Try to eat on a regular schedule  3 meals per day, snacking between meals should be limited to vegetables or fruits or small portions of nuts. 64 ounces of water per day is generally recommended, unless you have specific health conditions, like heart failure or kidney failure where you will need to limit fluid intake.  3. Commitment to sufficient and a  good quality of physical and mental rest daily, generally between 6 to 8 hours per day.  WITH PERSISTANCE AND PERSEVERANCE, THE IMPOSSIBLE , BECOMES THE NORM!   Thank you  for choosing [facility]. We consider it a privelige to serve you.  Delivering excellent health care in a caring and  compassionate way is our goal.  Partnering with you,  so that together we can achieve this goal is our strategy.

## 2016-10-15 NOTE — Assessment & Plan Note (Signed)
Abn UA in office will f/u c/s

## 2016-10-15 NOTE — Progress Notes (Signed)
    Laura Atkins     MRN: [id number]      DOB: 1941/03/13  HPI: Patient is in for annual physical exam. Increased left knee instability, and pain, wants Dr Aline Brochure to evaluate, has been advised of need to lose weight in the past Eye exam with Dr Katy Fitch C/o Bilateral ear pressure , feels as though things are moving aroiund in her ear and is using Q tips C/o urinary frequency . Recent labs, if available are reviewed. Immunization is reviewed , and  updated if needed.   PE: Pleasant  female, alert and oriented x 3, in no cardio-pulmonary distress. Afebrile. HEENT No facial trauma or asymetry. Sinuses non tender.  Extra occullar muscles intact, pupils equally reactive to light. External ears normal, tympanic membranes clear. Oropharynx moist, no exudate. Neck: supple, no adenopathy,JVD or thyromegaly.No bruits.  Chest: Clear to ascultation bilaterally.No crackles or wheezes. Non tender to palpation  Breast: No asymetry,no masses or lumps. No tenderness. No nipple discharge or inversion. No axillary or supraclavicular adenopathy  Cardiovascular system; Heart sounds normal,  S1 and  S2 ,no S3.  No murmur, or thrill. Apical beat not displaced Peripheral pulses normal.  Abdomen: Soft, non tender, no organomegaly or masses. No bruits. Bowel sounds normal. No guarding, tenderness or rebound.  Rectal:  Normal sphincter tone. No rectal mass. Guaiac negative stool.  GU:  Deferred, not indicated , no c/o  pelvic pressure and mass or drainage S/p hysterectomy  Musculoskeletal exam: Decreased  ROM of spine, hips , shoulders and knees. deformity ,swelling and  crepitus noted.in both knees No muscle wasting or atrophy.   Neurologic: Cranial nerves 2 to 12 intact. Power, tone , and reflexes normal throughout. Disturbance in gait noted,needs assistive device No tremor.  Skin: Intact, no ulceration, erythema , scaling or rash noted. Hyperpigmentation noted in lower  extremities with decreased dP  Psych; Normal mood and affect. Judgement and concentration normal   Assessment & Plan:  Annual physical exam Annual exam as documented. Counseling done  re healthy lifestyle involving commitment to 150 minutes exercise per week, heart healthy diet, and attaining healthy weight.The importance of adequate sleep also discussed.  Immunization and cancer screening needs are specifically addressed at this visit.   Left knee pain Increased pain and instability wants ortho eval and pain meds, start tramadol twice daily as needed, and refer to ortho of choice dr Daun Peacock, bilateral Normal exam, advised pt against using Q tips in ears  Special screening for malignant neoplasms, colon No palpable mass and heme negative stool  Urinary frequency Abn UA in office will f/u c/s

## 2016-10-15 NOTE — Assessment & Plan Note (Addendum)
Increased pain and instability wants ortho eval and pain meds, start tramadol twice daily as needed, and refer to ortho of choice dr Aline Brochure

## 2016-10-15 NOTE — Assessment & Plan Note (Signed)
Normal exam, advised pt against using Q tips in ears

## 2016-10-15 NOTE — Addendum Note (Signed)
Addended by: Eual Fines on: 10/15/2016 12:41 PM   Modules accepted: Orders

## 2016-10-15 NOTE — Assessment & Plan Note (Signed)
No palpable mass and heme negative stool

## 2016-10-16 LAB — MICROALBUMIN / CREATININE URINE RATIO
Creatinine, Urine: 107.1 mg/dL
Microalb Creat Ratio: 19.5 mg/g creat (ref 0.0–30.0)
Microalb, Ur: 20.9 ug/mL — ABNORMAL HIGH

## 2016-10-16 LAB — VITAMIN D 25 HYDROXY (VIT D DEFICIENCY, FRACTURES): Vit D, 25-Hydroxy: 14 ng/mL — ABNORMAL LOW (ref 30–100)

## 2016-10-18 ENCOUNTER — Other Ambulatory Visit: Payer: Self-pay | Admitting: Family Medicine

## 2016-10-18 LAB — URINE CULTURE: Culture: >=100000 — AB

## 2016-10-18 NOTE — Progress Notes (Signed)
clinda  

## 2016-10-19 ENCOUNTER — Other Ambulatory Visit: Payer: Self-pay

## 2016-10-19 MED ORDER — CLINDAMYCIN HCL 300 MG PO CAPS: 300.0000 mg | ORAL_CAPSULE | Freq: Three times a day (TID) | ORAL | 0 refills | Status: DC

## 2016-10-25 ENCOUNTER — Encounter (HOSPITAL_COMMUNITY): Payer: PPO

## 2016-10-25 ENCOUNTER — Encounter (HOSPITAL_COMMUNITY): Payer: Self-pay | Admitting: Oncology

## 2016-10-25 ENCOUNTER — Encounter (HOSPITAL_COMMUNITY): Payer: PPO | Attending: Oncology | Admitting: Oncology

## 2016-10-25 VITALS — BP 149/97 | HR 72 | Temp 98.2°F | Resp 20 | Wt 279.5 lb

## 2016-10-25 DIAGNOSIS — D509 Iron deficiency anemia, unspecified: Secondary | ICD-10-CM

## 2016-10-25 DIAGNOSIS — D560 Alpha thalassemia: Secondary | ICD-10-CM | POA: Diagnosis not present

## 2016-10-25 DIAGNOSIS — N183 Chronic kidney disease, stage 3 (moderate): Secondary | ICD-10-CM

## 2016-10-25 DIAGNOSIS — D563 Thalassemia minor: Secondary | ICD-10-CM

## 2016-10-25 LAB — CBC WITH DIFFERENTIAL/PLATELET
Basophils Absolute: 0 10*3/uL (ref 0.0–0.1)
Basophils Relative: 0 %
Eosinophils Absolute: 0.3 10*3/uL (ref 0.0–0.7)
Eosinophils Relative: 5 %
HCT: 31.8 % — ABNORMAL LOW (ref 36.0–46.0)
Hemoglobin: 9.7 g/dL — ABNORMAL LOW (ref 12.0–15.0)
Lymphocytes Relative: 35 %
Lymphs Abs: 2.1 10*3/uL (ref 0.7–4.0)
MCH: 20.9 pg — ABNORMAL LOW (ref 26.0–34.0)
MCHC: 30.5 g/dL (ref 30.0–36.0)
MCV: 68.4 fL — ABNORMAL LOW (ref 78.0–100.0)
Monocytes Absolute: 0.3 10*3/uL (ref 0.1–1.0)
Monocytes Relative: 5 %
Neutro Abs: 3.3 10*3/uL (ref 1.7–7.7)
Neutrophils Relative %: 55 %
Platelets: 183 10*3/uL (ref 150–400)
RBC: 4.65 MIL/uL (ref 3.87–5.11)
RDW: 16.3 % — ABNORMAL HIGH (ref 11.5–15.5)
WBC: 6 10*3/uL (ref 4.0–10.5)

## 2016-10-25 NOTE — Progress Notes (Signed)
Laura Nakayama, MD [address], [address] [place] [address]  Microcytic anemia - Plan: CBC with Differential, Iron and TIBC, Ferritin, Basic metabolic panel, CANCELED: Folate  Alpha thalassemia trait  CURRENT THERAPY: Work-up.  INTERVAL HISTORY: Laura Atkins 76 y.o. female returns for followup of alpha thalassemia trait in the setting of chronic renal disease, Stage III.  She is here today to learn results from recent work-up.  He is provided education regarding alpha thalassemia trait. I provider patient information from [facility] for her reviewed to educate herself regarding this disease.  She reports a plantar lesion with upcoming follow up with podiatry.  She otherwise denies any hematology complaints.  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  Review of Systems  Constitutional: Negative.  Negative for chills, fever and weight loss.  HENT: Negative.  Negative for nosebleeds.   Respiratory: Negative.  Negative for cough.   Cardiovascular: Negative.  Negative for chest pain.  Gastrointestinal: Negative.  Negative for blood in stool and melena.  Genitourinary: Negative.  Negative for hematuria.  Musculoskeletal: Negative.   Skin: Negative.   Neurological: Negative.  Negative for weakness.  Endo/Heme/Allergies: Negative.   Psychiatric/Behavioral: Negative.     Past Medical History:  Diagnosis Date  . Alpha thalassemia trait 01/26/2010   02/2012: Nl CBC ex H&H-10.7/34.8, MCV-69   . Anemia   . Anxiety   . Anxiety and depression   . Depression   . Diabetes mellitus   . GERD (gastroesophageal reflux disease)   . Headache(784.0)   . Hyperlipidemia   . Hypertension   . Microcytic anemia 01/26/2010   02/2012: Nl CBC ex H&H-10.7/34.8, MCV-69   . Obesity   . Obstructive sleep apnea   . Osteoarthritis    Left knee; right shoulder; chronic neck and back  pain  . Pruritus   . Seizures ([place])   . Tremor    This started months ago after her seizure progressing to very poor hand writing  . Urinary incontinence     Past Surgical History:  Procedure Laterality Date  . ABDOMINAL HYSTERECTOMY    . BREAST EXCISIONAL BIOPSY     Left; cyst  . CATARACT EXTRACTION W/ INTRAOCULAR LENS IMPLANT Left 09/07/2013  . CHOLECYSTECTOMY    . COLONOSCOPY    . COLONOSCOPY N/A 07/20/2015   Procedure: COLONOSCOPY;  Surgeon: Rogene Houston, MD;  [facility];  Service: Endoscopy;  Laterality: N/A;  930  . EYE SURGERY Left 09/07/2013   cataract    Family History  Problem Relation Age of Onset  . Cancer Mother 14    lung   . Kidney disease Father   . Diabetes Sister   . ADD / ADHD Grandchild   . Bipolar disorder Grandchild   . Bipolar disorder Daughter   . Alcohol abuse Neg Hx   . Drug abuse Neg Hx     Social History   Social History  . Marital status: Married    Spouse name: saunders  . Number of children: 5  . Years of education: 12   Occupational History  . Disabled  Retired   Social History Main Topics  . Smoking status: Never Smoker  . Smokeless tobacco: Never Used  . Alcohol use No  . Drug use: No  . Sexual activity: Yes    Birth control/ protection: Surgical   Other Topics Concern  . Not  on file   Social History Narrative   Patient lives at home with her husband [name].  Patient is retired.    Right handed.    Five Children.    Caffeine- 2 daily     PHYSICAL EXAMINATION  ECOG PERFORMANCE STATUS: 2 - Symptomatic, <50% confined to bed  Vitals:   10/25/16 1417  BP: (!) 149/97  Pulse: 72  Resp: 20  Temp: 98.2 F (36.8 C)    GENERAL:alert, no distress, comfortable, cooperative, obese, smiling and accompanied by her son, patient in wheelchair. SKIN: skin color, texture, turgor are normal, no rashes or significant lesions HEAD: Normocephalic, No masses, lesions, tenderness or abnormalities EYES: normal,  Conjunctiva are pink and non-injected EARS: External ears normal OROPHARYNX:lips, buccal mucosa, and tongue normal and mucous membranes are moist  NECK: supple, trachea midline LYMPH:  not examined BREAST:not examined LUNGS: clear to auscultation  HEART: regular rate & rhythm ABDOMEN:abdomen soft and normal bowel sounds BACK: Back symmetric, no curvature. EXTREMITIES:less then 2 second capillary refill, no joint deformities, effusion, or inflammation, no skin discoloration, no cyanosis  NEURO: alert & oriented x 3 with fluent speech, no focal motor/sensory deficits, in wheelchair   LABORATORY DATA: CBC    Component Value Date/Time   WBC 6.0 10/25/2016 1354   RBC 4.65 10/25/2016 1354   HGB 9.7 (L) 10/25/2016 1354   HCT 31.8 (L) 10/25/2016 1354   PLT 183 10/25/2016 1354   MCV 68.4 (L) 10/25/2016 1354   MCH 20.9 (L) 10/25/2016 1354   MCHC 30.5 10/25/2016 1354   RDW 16.3 (H) 10/25/2016 1354   LYMPHSABS 2.1 10/25/2016 1354   MONOABS 0.3 10/25/2016 1354   EOSABS 0.3 10/25/2016 1354   BASOSABS 0.0 10/25/2016 1354      Chemistry      Component Value Date/Time   NA 141 10/15/2016 1148   K 4.4 10/15/2016 1148   CL 103 10/15/2016 1148   CO2 28 10/15/2016 1148   BUN 23 10/15/2016 1148   CREATININE 1.17 (H) 10/15/2016 1148      Component Value Date/Time   CALCIUM 9.3 10/15/2016 1148   ALKPHOS 95 10/15/2016 1148   AST 21 10/15/2016 1148   ALT 21 10/15/2016 1148   BILITOT 0.3 10/15/2016 1148     Lab Results  Component Value Date   IRON 50 02/21/2016   TIBC 346 02/21/2016   FERRITIN 87 05/25/2016   Lab Results  Component Value Date   KXFGHWEX93 716 02/21/2016   Lab Results  Component Value Date   FOLATE 16.8 05/25/2016         PENDING LABS:   RADIOGRAPHIC STUDIES:  No results found.   PATHOLOGY:    ASSESSMENT AND PLAN:  Alpha thalassemia trait Alpha thalassemia minor, with mild microcytic anemia with a potential for anemia being below baseline  secondary to chronic renal disease, stage III.  Labs from September 2017 are reviewed.  She has alpha thalassemia trait.  She is provided education regarding this condition.    Labs in 3 months: CBC diff, BMET, iron/TIBC, ferritin.  Her microcytic anemia is secondary to alpha thalassemia minor.  However, her HGB is below baseline compared to past years.  Asa result, I wonder if her chronic renal disease is participating her her below baseline anemia.  If her anemia persists below baseline, she may be a candidate for Aranesp at renal dosing.  Return in 3 months for follow-up.   ORDERS PLACED FOR THIS ENCOUNTER: Orders Placed This Encounter  Procedures  .  CBC with Differential  . Iron and TIBC  . Ferritin  . Basic metabolic panel    MEDICATIONS PRESCRIBED THIS ENCOUNTER: No orders of the defined types were placed in this encounter.   THERAPY PLAN:  Surveillance.  All questions were answered. The patient knows to call the clinic with any problems, questions or concerns. We can certainly see the patient much sooner if necessary.  Patient and plan discussed with Dr. Twana First and she is in agreement with the aforementioned.   This note is electronically signed by: Doy Mince 10/25/2016 6:25 PM

## 2016-10-25 NOTE — Patient Instructions (Signed)
[place] at [facility] Discharge Instructions  RECOMMENDATIONS MADE BY THE CONSULTANT AND ANY TEST RESULTS WILL BE SENT TO YOUR REFERRING PHYSICIAN.  You were seen today by Kirby Crigler, PA-C Follow up in 3 months with lab work  Thank you for choosing [facility] at [facility] to provide your oncology and hematology care.  To afford each patient quality time with our provider, please arrive at least 15 minutes before your scheduled appointment time.    If you have a lab appointment with the [facility] please come in thru the  Main Entrance and check in at the main information desk  You need to re-schedule your appointment should you arrive 10 or more minutes late.  We strive to give you quality time with our providers, and arriving late affects you and other patients whose appointments are after yours.  Also, if you no show three or more times for appointments you may be dismissed from the clinic at the providers discretion.     Again, thank you for choosing [facility].  Our hope is that these requests will decrease the amount of time that you wait before being seen by our physicians.       _____________________________________________________________  Should you have questions after your visit to [facility], please contact our office at [phone] between the hours of 8:30 a.m. and 4:30 p.m.  Voicemails left after 4:30 p.m. will not be returned until the following business day.  For prescription refill requests, have your pharmacy contact our office.       Resources For Cancer Patients and their Caregivers ? American Cancer Society: Can assist with transportation, wigs, general needs, runs Look Good Feel Better.        [phone] ? Cancer Care: Provides financial assistance, online support groups, medication/co-pay assistance.  1-800-813-HOPE (930) 123-0004) ? [facility] Assists  [place] cancer patients and their families through emotional , educational and financial support.  3230772609 ? [place] DSS Where to apply for food stamps, Medicaid and utility assistance. (314)678-4313 ? RCATS: Transportation to medical appointments. 774-437-9441 ? Social Security Administration: May apply for disability if have a Stage IV cancer. 763-525-7176 973-105-9405 ? [employer], Disability and Transit Services: Assists with nutrition, care and transit needs. [place] Support Programs: @10RELATIVEDAYS @ > Cancer Support Group  2nd Tuesday of the month 1pm-2pm, Journey Room  > Creative Journey  3rd Tuesday of the month 1130am-1pm, Journey Room  > Look Good Feel Better  1st Wednesday of the month 10am-12 noon, Journey Room (Call [facility] to register [phone])

## 2016-10-25 NOTE — Assessment & Plan Note (Addendum)
Alpha thalassemia minor, with mild microcytic anemia with a potential for anemia being below baseline secondary to chronic renal disease, stage III.  Labs from September 2017 are reviewed.  She has alpha thalassemia trait.  She is provided education regarding this condition.    Labs in 3 months: CBC diff, BMET, iron/TIBC, ferritin.  Her microcytic anemia is secondary to alpha thalassemia minor.  However, her HGB is below baseline compared to past years.  Asa result, I wonder if her chronic renal disease is participating her her below baseline anemia.  If her anemia persists below baseline, she may be a candidate for Aranesp at renal dosing.  Return in 3 months for follow-up.

## 2016-10-31 ENCOUNTER — Ambulatory Visit: Payer: Self-pay | Admitting: Endocrinology

## 2016-11-01 ENCOUNTER — Ambulatory Visit: Payer: Self-pay

## 2016-11-09 ENCOUNTER — Other Ambulatory Visit: Payer: Self-pay | Admitting: Family Medicine

## 2016-11-13 ENCOUNTER — Encounter: Payer: Self-pay | Admitting: Orthopedic Surgery

## 2016-11-13 ENCOUNTER — Ambulatory Visit: Payer: Self-pay | Admitting: Orthopedic Surgery

## 2016-11-15 ENCOUNTER — Ambulatory Visit: Payer: Self-pay | Admitting: Endocrinology

## 2016-11-15 DIAGNOSIS — Z0289 Encounter for other administrative examinations: Secondary | ICD-10-CM

## 2016-11-16 ENCOUNTER — Telehealth: Payer: Self-pay | Admitting: Endocrinology

## 2016-11-16 NOTE — Telephone Encounter (Signed)
Patient no showed today's appt. Please advise on how to follow up. °A. No follow up necessary. °B. Follow up urgent. Contact patient immediately. °C. Follow up necessary. Contact patient and schedule visit in ___ days. °D. Follow up advised. Contact patient and schedule visit in ____weeks. ° °

## 2016-11-17 NOTE — Telephone Encounter (Signed)
Follow up advised. Contact patient and schedule visit in 4 weeks 

## 2016-11-21 ENCOUNTER — Ambulatory Visit: Payer: Self-pay | Admitting: Endocrinology

## 2016-11-22 ENCOUNTER — Other Ambulatory Visit: Payer: Self-pay | Admitting: Family Medicine

## 2016-11-23 DIAGNOSIS — H25811 Combined forms of age-related cataract, right eye: Secondary | ICD-10-CM | POA: Diagnosis not present

## 2016-11-23 DIAGNOSIS — Z961 Presence of intraocular lens: Secondary | ICD-10-CM | POA: Diagnosis not present

## 2016-11-23 DIAGNOSIS — H26492 Other secondary cataract, left eye: Secondary | ICD-10-CM | POA: Diagnosis not present

## 2016-11-29 ENCOUNTER — Ambulatory Visit (HOSPITAL_COMMUNITY): Payer: Self-pay | Admitting: Psychiatry

## 2016-12-05 ENCOUNTER — Encounter (INDEPENDENT_AMBULATORY_CARE_PROVIDER_SITE_OTHER): Payer: Self-pay | Admitting: Internal Medicine

## 2016-12-07 DIAGNOSIS — M79674 Pain in right toe(s): Secondary | ICD-10-CM | POA: Diagnosis not present

## 2016-12-07 DIAGNOSIS — L851 Acquired keratosis [keratoderma] palmaris et plantaris: Secondary | ICD-10-CM | POA: Diagnosis not present

## 2016-12-07 DIAGNOSIS — E1342 Other specified diabetes mellitus with diabetic polyneuropathy: Secondary | ICD-10-CM | POA: Diagnosis not present

## 2016-12-07 DIAGNOSIS — B351 Tinea unguium: Secondary | ICD-10-CM | POA: Diagnosis not present

## 2016-12-10 ENCOUNTER — Ambulatory Visit: Payer: Self-pay | Admitting: Endocrinology

## 2016-12-11 ENCOUNTER — Telehealth: Payer: Self-pay | Admitting: Endocrinology

## 2016-12-11 NOTE — Telephone Encounter (Signed)
Patient no showed today's appt. Please advise on how to follow up. °A. No follow up necessary. °B. Follow up urgent. Contact patient immediately. °C. Follow up necessary. Contact patient and schedule visit in ___ days. °D. Follow up advised. Contact patient and schedule visit in ____weeks. ° °

## 2016-12-11 NOTE — Telephone Encounter (Signed)
Please come back for a follow-up appointment in 6 weeks

## 2016-12-14 NOTE — Telephone Encounter (Signed)
error 

## 2016-12-17 ENCOUNTER — Encounter: Payer: Self-pay | Admitting: Endocrinology

## 2016-12-17 ENCOUNTER — Ambulatory Visit: Payer: Self-pay | Admitting: Endocrinology

## 2016-12-17 ENCOUNTER — Ambulatory Visit (INDEPENDENT_AMBULATORY_CARE_PROVIDER_SITE_OTHER): Payer: PPO | Admitting: Endocrinology

## 2016-12-17 VITALS — BP 144/84 | HR 89 | Ht 68.0 in | Wt 287.0 lb

## 2016-12-17 DIAGNOSIS — E669 Obesity, unspecified: Secondary | ICD-10-CM | POA: Diagnosis not present

## 2016-12-17 DIAGNOSIS — E1169 Type 2 diabetes mellitus with other specified complication: Secondary | ICD-10-CM | POA: Diagnosis not present

## 2016-12-17 LAB — POCT GLYCOSYLATED HEMOGLOBIN (HGB A1C): Hemoglobin A1C: 7.3

## 2016-12-17 NOTE — Progress Notes (Signed)
Subjective:    Patient ID: Laura Atkins, female    DOB: 27-Jul-1941, 76 y.o.   MRN: [id number]  HPI Pt returns for f/u of diabetes mellitus: DM type: insulin-requiring type 2.   Dx'ed: 2671 Complications: polyneuropathy, PAD, and foot ulcer.   Therapy: insulin since 2005.   GDM: never.  DKA: never Severe hypoglycemia: never.  Pancreatitis: never.   Other info: in early 2016, she changed to BID premixed insulin, due to poor results with multiple daily injections.  She takes human insulin, due to cost.   Interval history: she brings a record of her cbg's which I have reviewed today.  It varies from 89-195.  There is no trend throughout the day.  She never misses the insulin.   Past Medical History:  Diagnosis Date  . Alpha thalassemia trait 01/26/2010   02/2012: Nl CBC ex H&H-10.7/34.8, MCV-69   . Anemia   . Anxiety   . Anxiety and depression   . Depression   . Diabetes mellitus   . GERD (gastroesophageal reflux disease)   . Headache(784.0)   . Hyperlipidemia   . Hypertension   . Microcytic anemia 01/26/2010   02/2012: Nl CBC ex H&H-10.7/34.8, MCV-69   . Obesity   . Obstructive sleep apnea   . Osteoarthritis    Left knee; right shoulder; chronic neck and back pain  . Pruritus   . Seizures ([place])   . Tremor    This started months ago after her seizure progressing to very poor hand writing  . Urinary incontinence     Past Surgical History:  Procedure Laterality Date  . ABDOMINAL HYSTERECTOMY    . BREAST EXCISIONAL BIOPSY     Left; cyst  . CATARACT EXTRACTION W/ INTRAOCULAR LENS IMPLANT Left 09/07/2013  . CHOLECYSTECTOMY    . COLONOSCOPY    . COLONOSCOPY N/A 07/20/2015   Procedure: COLONOSCOPY;  Surgeon: Rogene Houston, MD;  [facility];  Service: Endoscopy;  Laterality: N/A;  930  . EYE SURGERY Left 09/07/2013   cataract    Social History   Social History  . Marital status: Married    Spouse name: Laura Atkins  . Number of children: 5  . Years of  education: 12   Occupational History  . Disabled  Retired   Social History Main Topics  . Smoking status: Never Smoker  . Smokeless tobacco: Never Used  . Alcohol use No  . Drug use: No  . Sexual activity: Yes    Birth control/ protection: Surgical   Other Topics Concern  . Not on file   Social History Narrative   Patient lives at home with her husband Laura Atkins).  Patient is retired.    Right handed.    Five Children.    Caffeine- 2 daily    Current Outpatient Prescriptions on File Prior to Visit  Medication Sig Dispense Refill  . acetaminophen (EXTRA STRENGTH PAIN RELIEF) 500 MG tablet Take 500 mg by mouth every 6 (six) hours as needed for pain.    Marland Kitchen amLODipine (NORVASC) 10 MG tablet TAKE 1 TABLET BY MOUTH EVERY DAY 90 tablet 1  . aspirin 81 MG tablet Take 81 mg by mouth daily.      Marland Kitchen atorvastatin (LIPITOR) 40 MG tablet TAKE 1 TABLET BY MOUTH EVERY DAY 90 tablet 1  . benazepril (LOTENSIN) 20 MG tablet TAKE 1 TABLET BY MOUTH EVERY DAY 90 tablet 1  . clindamycin (CLEOCIN) 300 MG capsule Take 1 capsule (300 mg total) by mouth  3 (three) times daily. 9 capsule 0  . diphenhydrAMINE (BENADRYL) 25 mg capsule Take 25 mg by mouth every 6 (six) hours as needed. Reported on 10/18/2015    . gabapentin (NEURONTIN) 400 MG capsule TAKE 1 CAPSULE BY MOUTH TWICE DAILY AND 2 CAPSULES AT BEDTIME 360 capsule 1  . insulin NPH-regular Human (NOVOLIN 70/30) (70-30) 100 UNIT/ML injection 55 units with breakfast and 20 units with supper, and syringes 2/day. 20 mL 11  . Multiple Vitamin (MULTIVITAMIN) capsule Take 1 capsule by mouth daily.    . naproxen (NAPROSYN) 500 MG tablet TAKE 1 TABLET BY MOUTH TWICE DAILY WITH A MEAL 30 tablet 0  . omeprazole (PRILOSEC) 20 MG capsule TAKE 1 CAPSULE BY MOUTH EVERY DAY 90 capsule 1  . ONE TOUCH ULTRA TEST test strip USE TO TEST BLOOD SUGAR FOUR TIMES DAILY 150 each 2  . potassium chloride SA (K-DUR,KLOR-CON) 20 MEQ tablet TAKE 1 TABLET BY MOUTH TWICE DAILY 180 tablet  1  . sertraline (ZOLOFT) 100 MG tablet Take 1 tablet (100 mg total) by mouth 2 (two) times daily. 60 tablet 4  . torsemide (DEMADEX) 20 MG tablet TAKE 2 TABLETS TWICE DAILY (STOP LASIX) 120 tablet 6  . traMADol (ULTRAM) 50 MG tablet Take 1 tablet (50 mg total) by mouth every 12 (twelve) hours as needed. 40 tablet 0  . Vitamin D, Ergocalciferol, (DRISDOL) 50000 units CAPS capsule Take 1 capsule (50,000 Units total) by mouth every 7 (seven) days. 4 capsule 5   No current facility-administered medications on file prior to visit.     Allergies  Allergen Reactions  . Penicillins Shortness Of Breath, Itching and Rash  . Prednisone Shortness Of Breath, Itching and Rash  . Propoxyphene N-Acetaminophen Itching and Nausea And Vomiting    Family History  Problem Relation Age of Onset  . Cancer Mother 50    lung   . Kidney disease Father   . Diabetes Sister   . ADD / ADHD Grandchild   . Bipolar disorder Grandchild   . Bipolar disorder Daughter   . Alcohol abuse Neg Hx   . Drug abuse Neg Hx     BP (!) 144/84   Pulse 89   Ht 5\' 8"  (1.727 m)   Wt 287 lb (130.2 kg)   SpO2 97%   BMI 43.64 kg/m   Review of Systems She denies hypoglycemia.     Objective:   Physical Exam VITAL SIGNS:  See vs page GENERAL: no distress.  Morbid obesity.  Pulses: dorsalis pedis absent bilat (prob due to edema).   MSK: no deformity of the feet.  CV: 2+ bilat leg edema.  Skin: no ulcer (healed)normal color and temp on the feet.  Neuro: sensation is intact to touch on the feet, but decreased from normal.   SWN:IOEVO is bilateral onychomycosis of the toenails.    a1c=7.3%    Assessment & Plan:  Insulin-requiring type 2 DM, with PAD: this is the best control this pt should aim for, given this regimen, which does match insulin to her changing needs throughout the day Patient is advised the following: Patient Instructions  check your blood sugar 4 times a day: before the 3 meals, and at bedtime.  also  check if you have symptoms of your blood sugar being too high or too low.  please keep a record of the readings and bring it to your next appointment here.  You can write it on any piece of paper.  please call us sooner  if your blood sugar goes below 70, or if you have a lot of readings over 200.  Please continue the same insulin: 55 units with breakfast (45 on sundays), and 20 units with supper.  For your safety, take this insulin right before you eat, and only when you eat the first and last meals of the day.  On this type of insulin schedule, you should eat meals on a regular schedule.  If a meal is missed or significantly delayed, your blood sugar could go low.   Please come back for a follow-up appointment in 4 months.

## 2016-12-17 NOTE — Patient Instructions (Addendum)
check your blood sugar 4 times a day: before the 3 meals, and at bedtime.  also check if you have symptoms of your blood sugar being too high or too low.  please keep a record of the readings and bring it to your next appointment here.  You can write it on any piece of paper.  please call us sooner if your blood sugar goes below 70, or if you have a lot of readings over 200.  Please continue the same insulin: 55 units with breakfast (45 on sundays), and 20 units with supper.  For your safety, take this insulin right before you eat, and only when you eat the first and last meals of the day.  On this type of insulin schedule, you should eat meals on a regular schedule.  If a meal is missed or significantly delayed, your blood sugar could go low.   Please come back for a follow-up appointment in 4 months.

## 2016-12-18 ENCOUNTER — Telehealth: Payer: Self-pay | Admitting: Family Medicine

## 2016-12-18 ENCOUNTER — Ambulatory Visit: Payer: Self-pay

## 2016-12-18 ENCOUNTER — Other Ambulatory Visit: Payer: Self-pay | Admitting: Family Medicine

## 2016-12-18 ENCOUNTER — Ambulatory Visit (HOSPITAL_COMMUNITY)
Admission: RE | Admit: 2016-12-18 | Discharge: 2016-12-18 | Disposition: A | Discharge status: Home / Self Care | Payer: PPO | Source: Ambulatory Visit | Attending: Family Medicine | Admitting: Family Medicine

## 2016-12-18 DIAGNOSIS — R239 Unspecified skin changes: Secondary | ICD-10-CM | POA: Diagnosis not present

## 2016-12-18 DIAGNOSIS — M79673 Pain in unspecified foot: Secondary | ICD-10-CM | POA: Diagnosis not present

## 2016-12-18 DIAGNOSIS — E1142 Type 2 diabetes mellitus with diabetic polyneuropathy: Secondary | ICD-10-CM | POA: Diagnosis not present

## 2016-12-18 DIAGNOSIS — M79672 Pain in left foot: Secondary | ICD-10-CM

## 2016-12-18 DIAGNOSIS — L89610 Pressure ulcer of right heel, unstageable: Secondary | ICD-10-CM | POA: Diagnosis not present

## 2016-12-18 DIAGNOSIS — S99922A Unspecified injury of left foot, initial encounter: Secondary | ICD-10-CM | POA: Diagnosis not present

## 2016-12-18 NOTE — Telephone Encounter (Signed)
Laura Atkins is c/o of a bad blister on her heel of her Lft Foot she fell last week and now is c/o pain and is asking for an xray to have it checked, please advise? She is waiting in the waiting room for advise?

## 2016-12-18 NOTE — Telephone Encounter (Signed)
X ray ordered [facility] to [facility] now, radiology dept

## 2016-12-18 NOTE — Telephone Encounter (Signed)
aware

## 2016-12-25 ENCOUNTER — Telehealth: Payer: Self-pay | Admitting: Family Medicine

## 2016-12-25 NOTE — Telephone Encounter (Signed)
Please let pt know that I did speak with Dr Caprice Beaver, and I do agree with his recommendations of having dermatology evaluate the stage 1 ulcer on her heel, and as he plans , if there is no answer from the dermatologist, then he will do further testing

## 2016-12-26 NOTE — Telephone Encounter (Signed)
Mailed letter °

## 2016-12-26 NOTE — Telephone Encounter (Signed)
Called pt no answer °

## 2016-12-27 ENCOUNTER — Encounter (INDEPENDENT_AMBULATORY_CARE_PROVIDER_SITE_OTHER): Payer: Self-pay | Admitting: Internal Medicine

## 2016-12-27 ENCOUNTER — Ambulatory Visit (INDEPENDENT_AMBULATORY_CARE_PROVIDER_SITE_OTHER): Payer: Self-pay | Admitting: Internal Medicine

## 2017-01-01 ENCOUNTER — Other Ambulatory Visit: Payer: Self-pay | Admitting: Family Medicine

## 2017-01-01 ENCOUNTER — Telehealth: Payer: Self-pay | Admitting: Family Medicine

## 2017-01-01 DIAGNOSIS — M25562 Pain in left knee: Secondary | ICD-10-CM

## 2017-01-01 NOTE — Telephone Encounter (Signed)
I have ordered an x ray of her left knee at [facility], needs to go today,offer ortho appt if interested also [place] or [place] wherever she prefers and enter , I will sign, thanks

## 2017-01-01 NOTE — Telephone Encounter (Signed)
Patient aware. Will let us know about referral

## 2017-01-01 NOTE — Telephone Encounter (Signed)
Patient calling because she is still experiencing a lot of pain from the fall that she took about 2 weeks ago.  She states the L knee cap is swollen but she had it bandaged and struggles with walking.  She states that when seen at [facility] they took xrays of the R and not the left.  Please call and advise

## 2017-01-02 ENCOUNTER — Ambulatory Visit (HOSPITAL_COMMUNITY)
Admission: RE | Admit: 2017-01-02 | Discharge: 2017-01-02 | Disposition: A | Discharge status: Home / Self Care | Payer: PPO | Source: Ambulatory Visit | Attending: Family Medicine | Admitting: Family Medicine

## 2017-01-02 DIAGNOSIS — M1712 Unilateral primary osteoarthritis, left knee: Secondary | ICD-10-CM | POA: Diagnosis not present

## 2017-01-02 DIAGNOSIS — M25562 Pain in left knee: Secondary | ICD-10-CM

## 2017-01-02 DIAGNOSIS — M7989 Other specified soft tissue disorders: Secondary | ICD-10-CM | POA: Diagnosis not present

## 2017-01-07 ENCOUNTER — Telehealth: Payer: Self-pay | Admitting: Family Medicine

## 2017-01-07 DIAGNOSIS — I872 Venous insufficiency (chronic) (peripheral): Secondary | ICD-10-CM | POA: Diagnosis not present

## 2017-01-07 DIAGNOSIS — I83009 Varicose veins of unspecified lower extremity with ulcer of unspecified site: Secondary | ICD-10-CM | POA: Diagnosis not present

## 2017-01-07 NOTE — Telephone Encounter (Signed)
Patient is calling to speak with Dr. Moshe Cipro. Would like to know the results of the radiology report done on this past Thursday when seen at [facility] for the left knee.

## 2017-01-08 NOTE — Telephone Encounter (Signed)
Patient aware.

## 2017-01-10 ENCOUNTER — Other Ambulatory Visit (HOSPITAL_COMMUNITY): Payer: Self-pay | Admitting: Psychiatry

## 2017-01-17 ENCOUNTER — Encounter (HOSPITAL_COMMUNITY): Payer: PPO | Attending: Oncology

## 2017-01-17 DIAGNOSIS — D509 Iron deficiency anemia, unspecified: Secondary | ICD-10-CM | POA: Diagnosis not present

## 2017-01-17 LAB — BASIC METABOLIC PANEL
Anion gap: 7 (ref 5–15)
BUN: 23 mg/dL — ABNORMAL HIGH (ref 6–20)
CO2: 28 mmol/L (ref 22–32)
Calcium: 9.2 mg/dL (ref 8.9–10.3)
Chloride: 103 mmol/L (ref 101–111)
Creatinine, Ser: 1.01 mg/dL — ABNORMAL HIGH (ref 0.44–1.00)
GFR calc Af Amer: >60 mL/min (ref >60)
GFR calc non Af Amer: 54 mL/min — ABNORMAL LOW (ref >60)
Glucose, Bld: 184 mg/dL — ABNORMAL HIGH (ref 65–99)
Potassium: 4.5 mmol/L (ref 3.5–5.1)
Sodium: 138 mmol/L (ref 135–145)

## 2017-01-17 LAB — CBC WITH DIFFERENTIAL/PLATELET
Basophils Absolute: 0 10*3/uL (ref 0.0–0.1)
Basophils Relative: 0 %
Eosinophils Absolute: 0.3 10*3/uL (ref 0.0–0.7)
Eosinophils Relative: 5 %
HCT: 32.9 % — ABNORMAL LOW (ref 36.0–46.0)
Hemoglobin: 10 g/dL — ABNORMAL LOW (ref 12.0–15.0)
Lymphocytes Relative: 50 %
Lymphs Abs: 2.7 10*3/uL (ref 0.7–4.0)
MCH: 20.8 pg — ABNORMAL LOW (ref 26.0–34.0)
MCHC: 30.4 g/dL (ref 30.0–36.0)
MCV: 68.4 fL — ABNORMAL LOW (ref 78.0–100.0)
Monocytes Absolute: 0.3 10*3/uL (ref 0.1–1.0)
Monocytes Relative: 6 %
Neutro Abs: 2.1 10*3/uL (ref 1.7–7.7)
Neutrophils Relative %: 39 %
Platelets: 178 10*3/uL (ref 150–400)
RBC: 4.81 MIL/uL (ref 3.87–5.11)
RDW: 15.9 % — ABNORMAL HIGH (ref 11.5–15.5)
WBC: 5.4 10*3/uL (ref 4.0–10.5)

## 2017-01-17 LAB — FERRITIN: Ferritin: 58 ng/mL (ref 11–307)

## 2017-01-17 LAB — IRON AND TIBC
Iron: 62 ug/dL (ref 28–170)
Saturation Ratios: 18 % (ref 10.4–31.8)
TIBC: 350 ug/dL (ref 250–450)
UIBC: 288 ug/dL

## 2017-01-21 ENCOUNTER — Other Ambulatory Visit (HOSPITAL_COMMUNITY): Payer: Self-pay | Admitting: Oncology

## 2017-01-21 ENCOUNTER — Encounter (HOSPITAL_COMMUNITY): Payer: Self-pay | Admitting: Oncology

## 2017-01-21 DIAGNOSIS — E611 Iron deficiency: Secondary | ICD-10-CM

## 2017-01-21 HISTORY — DX: Iron deficiency: E61.1

## 2017-01-23 ENCOUNTER — Ambulatory Visit (HOSPITAL_COMMUNITY): Payer: Self-pay

## 2017-01-24 ENCOUNTER — Other Ambulatory Visit (HOSPITAL_COMMUNITY): Payer: Self-pay | Admitting: Oncology

## 2017-01-24 ENCOUNTER — Encounter (HOSPITAL_COMMUNITY): Payer: Self-pay

## 2017-01-24 ENCOUNTER — Encounter (HOSPITAL_COMMUNITY): Payer: PPO | Attending: Oncology

## 2017-01-24 VITALS — BP 139/57 | HR 67 | Temp 98.4°F | Resp 18

## 2017-01-24 DIAGNOSIS — D509 Iron deficiency anemia, unspecified: Secondary | ICD-10-CM | POA: Insufficient documentation

## 2017-01-24 DIAGNOSIS — E611 Iron deficiency: Secondary | ICD-10-CM

## 2017-01-24 MED ORDER — SODIUM CHLORIDE 0.9 % IV SOLN
510.0000 mg | Freq: Once | INTRAVENOUS | Status: AC
  Administered 2017-01-24: 510 mg via INTRAVENOUS
  Filled 2017-01-24: qty 17

## 2017-01-24 MED ORDER — SODIUM CHLORIDE 0.9 % IV SOLN
Freq: Once | INTRAVENOUS | Status: AC
  Administered 2017-01-24: 14:00:00 via INTRAVENOUS

## 2017-01-24 NOTE — Progress Notes (Signed)
feraheme given today per orders. Patient tolerated it well, no issues . Vitals stable and discharged home from clinic via wheelchair. Follow up as scheduled.

## 2017-01-24 NOTE — Patient Instructions (Signed)
[facility] at [facility] Discharge Instructions  RECOMMENDATIONS MADE BY THE CONSULTANT AND ANY TEST RESULTS WILL BE SENT TO YOUR REFERRING PHYSICIAN.  Feraheme given  Follow up as scheduled.  Thank you for choosing [facility] at [facility] to provide your oncology and hematology care.  To afford each patient quality time with our provider, please arrive at least 15 minutes before your scheduled appointment time.    If you have a lab appointment with the Cancer Center please come in thru the  Main Entrance and check in at the main information desk  You need to re-schedule your appointment should you arrive 10 or more minutes late.  We strive to give you quality time with our providers, and arriving late affects you and other patients whose appointments are after yours.  Also, if you no show three or more times for appointments you may be dismissed from the clinic at the providers discretion.     Again, thank you for choosing [facility].  Our hope is that these requests will decrease the amount of time that you wait before being seen by our physicians.       _____________________________________________________________  Should you have questions after your visit to [facility], please contact our office at [phone] between the hours of 8:30 a.m. and 4:30 p.m.  Voicemails left after 4:30 p.m. will not be returned until the following business day.  For prescription refill requests, have your pharmacy contact our office.       Resources For Cancer Patients and their Caregivers ? American Cancer Society: Can assist with transportation, wigs, general needs, runs Look Good Feel Better.        1-888-227-6333 ? Cancer Care: Provides financial assistance, online support groups, medication/co-pay assistance.  1-800-813-HOPE (4673) ? [facility] Assists [place] cancer patients and their  families through emotional , educational and financial support.  336-427-4357 ? [place] DSS Where to apply for food stamps, Medicaid and utility assistance. 336-342-1394 ? RC[employer]: Transportation to medical appointments. 336-347-2287 ? Social Security Administration: May apply for disability if have a Stage IV cancer. 336-342-7796 1-800-772-1213 ? [place] Aging, Disability and Transit Services: Assists with nutrition, care and transit needs. 336-349-2343  Cancer Center Support Programs: @10RELATIVEDAYS@ > Cancer Support Group  2nd Tuesday of the month 1pm-2pm, Journey Room  > Creative Journey  3rd Tuesday of the month 1130am-1pm, Journey Room  > Look Good Feel Better  1st Wednesday of the month 10am-12 noon, Journey Room (Call [employer] to register 1-800-395-5775)   

## 2017-02-11 ENCOUNTER — Ambulatory Visit (INDEPENDENT_AMBULATORY_CARE_PROVIDER_SITE_OTHER): Payer: PPO | Admitting: Family Medicine

## 2017-02-11 ENCOUNTER — Ambulatory Visit (INDEPENDENT_AMBULATORY_CARE_PROVIDER_SITE_OTHER): Payer: PPO

## 2017-02-11 ENCOUNTER — Encounter: Payer: Self-pay | Admitting: Family Medicine

## 2017-02-11 VITALS — BP 142/72 | HR 84 | Resp 16 | Ht 68.0 in | Wt 280.0 lb

## 2017-02-11 VITALS — BP 142/72 | HR 78 | Ht 68.0 in | Wt 280.0 lb

## 2017-02-11 DIAGNOSIS — F32A Depression, unspecified: Secondary | ICD-10-CM

## 2017-02-11 DIAGNOSIS — E559 Vitamin D deficiency, unspecified: Secondary | ICD-10-CM

## 2017-02-11 DIAGNOSIS — E1169 Type 2 diabetes mellitus with other specified complication: Secondary | ICD-10-CM | POA: Diagnosis not present

## 2017-02-11 DIAGNOSIS — F419 Anxiety disorder, unspecified: Secondary | ICD-10-CM | POA: Diagnosis not present

## 2017-02-11 DIAGNOSIS — Z0000 Encounter for general adult medical examination without abnormal findings: Secondary | ICD-10-CM | POA: Diagnosis not present

## 2017-02-11 DIAGNOSIS — K219 Gastro-esophageal reflux disease without esophagitis: Secondary | ICD-10-CM

## 2017-02-11 DIAGNOSIS — E782 Mixed hyperlipidemia: Secondary | ICD-10-CM

## 2017-02-11 DIAGNOSIS — Z7409 Other reduced mobility: Secondary | ICD-10-CM

## 2017-02-11 DIAGNOSIS — G8929 Other chronic pain: Secondary | ICD-10-CM | POA: Diagnosis not present

## 2017-02-11 DIAGNOSIS — R269 Unspecified abnormalities of gait and mobility: Secondary | ICD-10-CM

## 2017-02-11 DIAGNOSIS — N39498 Other specified urinary incontinence: Secondary | ICD-10-CM

## 2017-02-11 DIAGNOSIS — E669 Obesity, unspecified: Secondary | ICD-10-CM

## 2017-02-11 DIAGNOSIS — M25562 Pain in left knee: Secondary | ICD-10-CM | POA: Insufficient documentation

## 2017-02-11 DIAGNOSIS — I10 Essential (primary) hypertension: Secondary | ICD-10-CM

## 2017-02-11 DIAGNOSIS — F329 Major depressive disorder, single episode, unspecified: Secondary | ICD-10-CM

## 2017-02-11 NOTE — Assessment & Plan Note (Addendum)
Worsening pain and instability and left knee referred to orthopedics for evaluation and management

## 2017-02-11 NOTE — Patient Instructions (Addendum)
F/u in 3.5  month, call if you need me before  You are referred to orthopedics in [place] re left knee pain and swelling due to worsening arthritis  Fasting lipid, cmp , tSH and vit D 1 week before next visit  Pls schedule mental health appt after you leave  We will request new walker and elevated toilet seat as requested   Careful not to fall  If needed after discuss with Albina Billet , I will request social worker to call Your blod pressure, kidney function and blood sugar are all better, congrats, keep it up!   Fall Prevention in the Home Falls can cause injuries. They can happen to people of all ages. There are many things you can do to make your home safe and to help prevent falls. What can I do on the outside of my home?  Regularly fix the edges of walkways and driveways and fix any cracks.  Remove anything that might make you trip as you walk through a door, such as a raised step or threshold.  Trim any bushes or trees on the path to your home.  Use bright outdoor lighting.  Clear any walking paths of anything that might make someone trip, such as rocks or tools.  Regularly check to see if handrails are loose or broken. Make sure that both sides of any steps have handrails.  Any raised decks and porches should have guardrails on the edges.  Have any leaves, snow, or ice cleared regularly.  Use sand or salt on walking paths during winter.  Clean up any spills in your garage right away. This includes oil or grease spills. What can I do in the bathroom?  Use night lights.  Install grab bars by the toilet and in the tub and shower. Do not use towel bars as grab bars.  Use non-skid mats or decals in the tub or shower.  If you need to sit down in the shower, use a plastic, non-slip stool.  Keep the floor dry. Clean up any water that spills on the floor as soon as it happens.  Remove soap buildup in the tub or shower regularly.  Attach bath mats securely with  double-sided non-slip rug tape.  Do not have throw rugs and other things on the floor that can make you trip. What can I do in the bedroom?  Use night lights.  Make sure that you have a light by your bed that is easy to reach.  Do not use any sheets or blankets that are too big for your bed. They should not hang down onto the floor.  Have a firm chair that has side arms. You can use this for support while you get dressed.  Do not have throw rugs and other things on the floor that can make you trip. What can I do in the kitchen?  Clean up any spills right away.  Avoid walking on wet floors.  Keep items that you use a lot in easy-to-reach places.  If you need to reach something above you, use a strong step stool that has a grab bar.  Keep electrical cords out of the way.  Do not use floor polish or wax that makes floors slippery. If you must use wax, use non-skid floor wax.  Do not have throw rugs and other things on the floor that can make you trip. What can I do with my stairs?  Do not leave any items on the stairs.  Make sure that there  are handrails on both sides of the stairs and use them. Fix handrails that are broken or loose. Make sure that handrails are as long as the stairways.  Check any carpeting to make sure that it is firmly attached to the stairs. Fix any carpet that is loose or worn.  Avoid having throw rugs at the top or bottom of the stairs. If you do have throw rugs, attach them to the floor with carpet tape.  Make sure that you have a light switch at the top of the stairs and the bottom of the stairs. If you do not have them, ask someone to add them for you. What else can I do to help prevent falls?  Wear shoes that:  Do not have high heels.  Have rubber bottoms.  Are comfortable and fit you well.  Are closed at the toe. Do not wear sandals.  If you use a stepladder:  Make sure that it is fully opened. Do not climb a closed stepladder.  Make  sure that both sides of the stepladder are locked into place.  Ask someone to hold it for you, if possible.  Clearly mark and make sure that you can see:  Any grab bars or handrails.  First and last steps.  Where the edge of each step is.  Use tools that help you move around (mobility aids) if they are needed. These include:  Canes.  Walkers.  Scooters.  Crutches.  Turn on the lights when you go into a dark area. Replace any light bulbs as soon as they burn out.  Set up your furniture so you have a clear path. Avoid moving your furniture around.  If any of your floors are uneven, fix them.  If there are any pets around you, be aware of where they are.  Review your medicines with your doctor. Some medicines can make you feel dizzy. This can increase your chance of falling. Ask your doctor what other things that you can do to help prevent falls. This information is not intended to replace advice given to you by your health care provider. Make sure you discuss any questions you have with your health care provider. Document Released: 07/07/2009 Document Revised: 02/16/2016 Document Reviewed: 10/15/2014 Elsevier Interactive Patient Education  2017 [employer].

## 2017-02-11 NOTE — Patient Instructions (Addendum)
Laura Atkins , Thank you for taking time to come for your Medicare Wellness Visit. I appreciate your ongoing commitment to your health goals. Please review the following plan we discussed and let me know if I can assist you in the future.   Screening recommendations/referrals: Colonoscopy: Up to date, next due 06/2025 Mammogram: Up to date, next due 02/2017 Bone Density: Up to date Diabetic Eye Exam: Overdue Recommended yearly dental visit for hygiene and checkup  Vaccinations: Influenza vaccine: Up to date, next due 04/2017 Pneumococcal vaccine: Up to date Tdap vaccine: Up to date, next due 08/2020 Shingles vaccine: Due    Advanced directives: Advance directive discussed with you today. I have provided a copy for you to complete at home and have notarized. Once this is complete please bring a copy in to our office so we can scan it into your chart.  Conditions/risks identified: Obese, recommend starting a routine exercise program at least 3 days a week for 30-45 minutes at a time as tolerated.   Next appointment: Follow up in 1 year for your annual wellness visit.   Preventive Care 36 Years and Older, Female Preventive care refers to lifestyle choices and visits with your health care provider that can promote health and wellness. What does preventive care include?  A yearly physical exam. This is also called an annual well check.  Dental exams once or twice a year.  Routine eye exams. Ask your health care provider how often you should have your eyes checked.  Personal lifestyle choices, including:  Daily care of your teeth and gums.  Regular physical activity.  Eating a healthy diet.  Avoiding tobacco and drug use.  Limiting alcohol use.  Practicing safe sex.  Taking low-dose aspirin every day.  Taking vitamin and mineral supplements as recommended by your health care provider. What happens during an annual well check? The services and screenings done by your health care  provider during your annual well check will depend on your age, overall health, lifestyle risk factors, and family history of disease. Counseling  Your health care provider may ask you questions about your:  Alcohol use.  Tobacco use.  Drug use.  Emotional well-being.  Home and relationship well-being.  Sexual activity.  Eating habits.  History of falls.  Memory and ability to understand (cognition).  Work and work Statistician.  Reproductive health. Screening  You may have the following tests or measurements:  Height, weight, and BMI.  Blood pressure.  Lipid and cholesterol levels. These may be checked every 5 years, or more frequently if you are over 87 years old.  Skin check.  Lung cancer screening. You may have this screening every year starting at age 71 if you have a 30-pack-year history of smoking and currently smoke or have quit within the past 15 years.  Fecal occult blood test (FOBT) of the stool. You may have this test every year starting at age 43.  Flexible sigmoidoscopy or colonoscopy. You may have a sigmoidoscopy every 5 years or a colonoscopy every 10 years starting at age 75.  Hepatitis C blood test.  Hepatitis B blood test.  Sexually transmitted disease (STD) testing.  Diabetes screening. This is done by checking your blood sugar (glucose) after you have not eaten for a while (fasting). You may have this done every 1-3 years.  Bone density scan. This is done to screen for osteoporosis. You may have this done starting at age 98.  Mammogram. This may be done every 1-2 years. Talk  to your health care provider about how often you should have regular mammograms. Talk with your health care provider about your test results, treatment options, and if necessary, the need for more tests. Vaccines  Your health care provider may recommend certain vaccines, such as:  Influenza vaccine. This is recommended every year.  Tetanus, diphtheria, and acellular  pertussis (Tdap, Td) vaccine. You may need a Td booster every 10 years.  Zoster vaccine. You may need this after age 71.  Pneumococcal 13-valent conjugate (PCV13) vaccine. One dose is recommended after age 7.  Pneumococcal polysaccharide (PPSV23) vaccine. One dose is recommended after age 32. Talk to your health care provider about which screenings and vaccines you need and how often you need them. This information is not intended to replace advice given to you by your health care provider. Make sure you discuss any questions you have with your health care provider. Document Released: 10/07/2015 Document Revised: 05/30/2016 Document Reviewed: 07/12/2015 Elsevier Interactive Patient Education  2017 [place] Prevention in the Home Falls can cause injuries. They can happen to people of all ages. There are many things you can do to make your home safe and to help prevent falls. What can I do on the outside of my home?  Regularly fix the edges of walkways and driveways and fix any cracks.  Remove anything that might make you trip as you walk through a door, such as a raised step or threshold.  Trim any bushes or trees on the path to your home.  Use bright outdoor lighting.  Clear any walking paths of anything that might make someone trip, such as rocks or tools.  Regularly check to see if handrails are loose or broken. Make sure that both sides of any steps have handrails.  Any raised decks and porches should have guardrails on the edges.  Have any leaves, snow, or ice cleared regularly.  Use sand or salt on walking paths during winter.  Clean up any spills in your garage right away. This includes oil or grease spills. What can I do in the bathroom?  Use night lights.  Install grab bars by the toilet and in the tub and shower. Do not use towel bars as grab bars.  Use non-skid mats or decals in the tub or shower.  If you need to sit down in the shower, use a plastic,  non-slip stool.  Keep the floor dry. Clean up any water that spills on the floor as soon as it happens.  Remove soap buildup in the tub or shower regularly.  Attach bath mats securely with double-sided non-slip rug tape.  Do not have throw rugs and other things on the floor that can make you trip. What can I do in the bedroom?  Use night lights.  Make sure that you have a light by your bed that is easy to reach.  Do not use any sheets or blankets that are too big for your bed. They should not hang down onto the floor.  Have a firm chair that has side arms. You can use this for support while you get dressed.  Do not have throw rugs and other things on the floor that can make you trip. What can I do in the kitchen?  Clean up any spills right away.  Avoid walking on wet floors.  Keep items that you use a lot in easy-to-reach places.  If you need to reach something above you, use a strong step stool that  has a grab bar.  Keep electrical cords out of the way.  Do not use floor polish or wax that makes floors slippery. If you must use wax, use non-skid floor wax.  Do not have throw rugs and other things on the floor that can make you trip. What can I do with my stairs?  Do not leave any items on the stairs.  Make sure that there are handrails on both sides of the stairs and use them. Fix handrails that are broken or loose. Make sure that handrails are as long as the stairways.  Check any carpeting to make sure that it is firmly attached to the stairs. Fix any carpet that is loose or worn.  Avoid having throw rugs at the top or bottom of the stairs. If you do have throw rugs, attach them to the floor with carpet tape.  Make sure that you have a light switch at the top of the stairs and the bottom of the stairs. If you do not have them, ask someone to add them for you. What else can I do to help prevent falls?  Wear shoes that:  Do not have high heels.  Have rubber  bottoms.  Are comfortable and fit you well.  Are closed at the toe. Do not wear sandals.  If you use a stepladder:  Make sure that it is fully opened. Do not climb a closed stepladder.  Make sure that both sides of the stepladder are locked into place.  Ask someone to hold it for you, if possible.  Clearly mark and make sure that you can see:  Any grab bars or handrails.  First and last steps.  Where the edge of each step is.  Use tools that help you move around (mobility aids) if they are needed. These include:  Canes.  Walkers.  Scooters.  Crutches.  Turn on the lights when you go into a dark area. Replace any light bulbs as soon as they burn out.  Set up your furniture so you have a clear path. Avoid moving your furniture around.  If any of your floors are uneven, fix them.  If there are any pets around you, be aware of where they are.  Review your medicines with your doctor. Some medicines can make you feel dizzy. This can increase your chance of falling. Ask your doctor what other things that you can do to help prevent falls. This information is not intended to replace advice given to you by your health care provider. Make sure you discuss any questions you have with your health care provider. Document Released: 07/07/2009 Document Revised: 02/16/2016 Document Reviewed: 10/15/2014 Elsevier Interactive Patient Education  2017 [employer].

## 2017-02-11 NOTE — Progress Notes (Signed)
Subjective:   Laura Atkins is a 76 y.o. female who presents for Medicare Annual (Subsequent) preventive examination.  Review of Systems:  Cardiac Risk Factors include: advanced age (>63men, >26 women);diabetes mellitus;hypertension;obesity (BMI >30kg/m2);sedentary lifestyle     Objective:     Vitals: BP (!) 142/72   Pulse 78   Ht 5\' 8"  (1.727 m)   Wt 280 lb 0.6 oz (127 kg)   SpO2 97%   BMI 42.58 kg/m   Body mass index is 42.58 kg/m.   Tobacco History  Smoking Status  . Never Smoker  Smokeless Tobacco  . Never Used     Counseling given: Not Answered   Past Medical History:  Diagnosis Date  . Alpha thalassemia trait 01/26/2010   02/2012: Nl CBC ex H&H-10.7/34.8, MCV-69   . Anemia   . Anxiety   . Anxiety and depression   . Depression   . Diabetes mellitus   . GERD (gastroesophageal reflux disease)   . Headache(784.0)   . Hyperlipidemia   . Hypertension   . Iron deficiency 01/21/2017  . Microcytic anemia 01/26/2010   02/2012: Nl CBC ex H&H-10.7/34.8, MCV-69   . Obesity   . Obstructive sleep apnea   . Osteoarthritis    Left knee; right shoulder; chronic neck and back pain  . Pruritus   . Seizures ([place])   . Tremor    This started months ago after her seizure progressing to very poor hand writing  . Urinary incontinence    Past Surgical History:  Procedure Laterality Date  . ABDOMINAL HYSTERECTOMY    . BREAST EXCISIONAL BIOPSY     Left; cyst  . CATARACT EXTRACTION W/ INTRAOCULAR LENS IMPLANT Left 09/07/2013  . CHOLECYSTECTOMY    . COLONOSCOPY    . COLONOSCOPY N/A 07/20/2015   Procedure: COLONOSCOPY;  Surgeon: Rogene Houston, MD;  Location: [facility];  Service: Endoscopy;  Laterality: N/A;  930  . EYE SURGERY Left 09/07/2013   cataract   Family History  Problem Relation Age of Onset  . Lung cancer Mother 7  . Kidney disease Father   . Diabetes Sister   . Keloids Brother   . ADD / ADHD Grandchild   . Bipolar disorder Grandchild   . Bipolar  disorder Daughter   . Seizures Daughter   . Heart disease Daughter   . Kidney disease Son   . Neuropathy Son   . Kidney disease Son   . Edema Daughter   . Breast cancer Daughter 89  . Allergies Daughter   . Alcohol abuse Neg Hx   . Drug abuse Neg Hx    History  Sexual Activity  . Sexual activity: Yes  . Birth control/ protection: Surgical    Outpatient Encounter Prescriptions as of 02/11/2017  Medication Sig  . acetaminophen (EXTRA STRENGTH PAIN RELIEF) 500 MG tablet Take 500 mg by mouth every 6 (six) hours as needed for pain.  Marland Kitchen amLODipine (NORVASC) 10 MG tablet TAKE 1 TABLET BY MOUTH EVERY DAY  . aspirin 81 MG tablet Take 81 mg by mouth daily.    Marland Kitchen atorvastatin (LIPITOR) 40 MG tablet TAKE 1 TABLET BY MOUTH EVERY DAY  . benazepril (LOTENSIN) 20 MG tablet TAKE 1 TABLET BY MOUTH EVERY DAY  . diphenhydrAMINE (BENADRYL) 25 mg capsule Take 25 mg by mouth at bedtime. Reported on 10/18/2015  . gabapentin (NEURONTIN) 400 MG capsule TAKE 1 CAPSULE BY MOUTH TWICE DAILY AND 2 CAPSULES AT BEDTIME  . HYDROcodone-acetaminophen (NORCO/VICODIN) 5-325 MG tablet  Take 1 tablet by mouth every 6 (six) hours as needed.  . insulin NPH-regular Human (NOVOLIN 70/30) (70-30) 100 UNIT/ML injection 55 units with breakfast and 20 units with supper, and syringes 2/day.  . Multiple Vitamin (MULTIVITAMIN) capsule Take 1 capsule by mouth daily.  . naproxen (NAPROSYN) 500 MG tablet TAKE 1 TABLET BY MOUTH TWICE DAILY WITH A MEAL  . omeprazole (PRILOSEC) 20 MG capsule TAKE 1 CAPSULE BY MOUTH EVERY DAY  . ONE TOUCH ULTRA TEST test strip USE TO TEST BLOOD SUGAR FOUR TIMES DAILY  . potassium chloride SA (K-DUR,KLOR-CON) 20 MEQ tablet TAKE 1 TABLET BY MOUTH TWICE DAILY  . sertraline (ZOLOFT) 100 MG tablet TAKE ONE TABLET BY MOUTH TWICE DAILY  . torsemide (DEMADEX) 20 MG tablet TAKE 2 TABLETS TWICE DAILY (STOP LASIX) (Patient taking differently: 1 tablet Twice a Day)  . traMADol (ULTRAM) 50 MG tablet Take 1 tablet (50 mg  total) by mouth every 12 (twelve) hours as needed.  . Vitamin D, Ergocalciferol, (DRISDOL) 50000 units CAPS capsule Take 1 capsule (50,000 Units total) by mouth every 7 (seven) days. (Patient not taking: Reported on 02/11/2017)  . [DISCONTINUED] clindamycin (CLEOCIN) 300 MG capsule Take 1 capsule (300 mg total) by mouth 3 (three) times daily.   No facility-administered encounter medications on file as of 02/11/2017.     Activities of Daily Living In your present state of health, do you have any difficulty performing the following activities: 02/11/2017  Hearing? Y  Vision? Y  Difficulty concentrating or making decisions? N  Walking or climbing stairs? Y  Dressing or bathing? Y  Doing errands, shopping? N  Preparing Food and eating ? N  Using the Toilet? N  In the past six months, have you accidently leaked urine? N  Do you have problems with loss of bowel control? N  Managing your Medications? N  Managing your Finances? N  Housekeeping or managing your Housekeeping? Y  Some recent data might be hidden    Patient Care Team: Fayrene Helper, MD as PCP - General Marcial Pacas, MD as Consulting Physician (Neurology) Renato Shin, MD as Consulting Physician (Endocrinology) Cloria Spring, MD as Consulting Physician ([facility]) Alonza Smoker, LCSW as Social Worker (Psychiatry) Clent Jacks, MD as Consulting Physician (Ophthalmology)    Assessment:    Exercise Activities and Dietary recommendations Current Exercise Habits: The patient does not participate in regular exercise at present, Exercise limited by: orthopedic condition(s)  Goals    . Exercise 3x per week (30 min per time)          Recommend starting a routine exercise program at least 3 days a week for 30-45 minutes at a time as tolerated.        Fall Risk Fall Risk  02/11/2017 10/15/2016 08/10/2015 04/11/2015 02/07/2015  Falls in the past year? Yes No No No No  Number falls in past yr: 2 or more - - - -    Injury with Fall? Yes - - - -  Risk Factor Category  High Fall Risk - - - -  Risk for fall due to : History of fall(s);Impaired balance/gait;Impaired mobility - - - -  Follow up Falls evaluation completed;Education provided - - - -   Depression Screen PHQ 2/9 Scores 02/11/2017 10/15/2016 02/21/2016 02/07/2015  PHQ - 2 Score 2 - 3 4  PHQ- 9 Score 5 - 11 7  Exception Documentation - Patient refusal - -     Cognitive Function: Normal   6CIT  Screen 02/11/2017  What Year? 0 points  What month? 0 points  What time? 0 points  Count back from 20 0 points  Months in reverse 0 points  Repeat phrase 0 points  Total Score 0    Immunization History  Administered Date(s) Administered  . H1N1 07/15/2008  . Influenza Split 06/17/2012  . Influenza Whole 07/03/2007, 06/17/2009, 06/07/2011  . Influenza,inj,Quad PF,36+ Mos 07/13/2013, 09/06/2014, 08/10/2015, 05/23/2016  . Pneumococcal Conjugate-13 05/03/2014  . Pneumococcal Polysaccharide-23 09/08/2010  . Td 09/08/2010   Screening Tests Health Maintenance  Topic Date Due  . OPHTHALMOLOGY EXAM  12/17/2014  . INFLUENZA VACCINE  04/24/2017  . HEMOGLOBIN A1C  06/19/2017  . FOOT EXAM  12/17/2017  . MAMMOGRAM  02/28/2018  . TETANUS/TDAP  09/08/2020  . COLONOSCOPY  07/19/2025  . DEXA SCAN  Completed  . PNA vac Low Risk Adult  Completed      Plan:   I have personally reviewed and noted the following in the patient's chart:   . Medical and social history . Use of alcohol, tobacco or illicit drugs  . Current medications and supplements . Functional ability and status . Nutritional status . Physical activity . Advanced directives . List of other physicians . Hospitalizations, surgeries, and ER visits in previous 12 months . Vitals . Screenings to include cognitive, depression, and falls . Referrals and appointments: [employer] referral sent today  In addition, I have reviewed and discussed with patient certain preventive  protocols, quality metrics, and best practice recommendations. A written personalized care plan for preventive services as well as general preventive health recommendations were provided to patient.     Stormy Fabian, LPN  [id number]

## 2017-02-12 ENCOUNTER — Other Ambulatory Visit (HOSPITAL_COMMUNITY): Payer: Self-pay | Admitting: Psychiatry

## 2017-02-14 ENCOUNTER — Ambulatory Visit (INDEPENDENT_AMBULATORY_CARE_PROVIDER_SITE_OTHER): Payer: PPO | Admitting: Psychiatry

## 2017-02-14 ENCOUNTER — Encounter (HOSPITAL_COMMUNITY): Payer: Self-pay | Admitting: Psychiatry

## 2017-02-14 VITALS — BP 150/69 | HR 75 | Ht 68.0 in | Wt 283.4 lb

## 2017-02-14 DIAGNOSIS — F32A Depression, unspecified: Secondary | ICD-10-CM

## 2017-02-14 DIAGNOSIS — F329 Major depressive disorder, single episode, unspecified: Secondary | ICD-10-CM

## 2017-02-14 MED ORDER — SERTRALINE HCL 100 MG PO TABS: 100.0000 mg | ORAL_TABLET | Freq: Two times a day (BID) | ORAL | 5 refills | Status: DC
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
## 2017-02-15 DIAGNOSIS — B351 Tinea unguium: Secondary | ICD-10-CM | POA: Diagnosis not present

## 2017-02-15 DIAGNOSIS — E1342 Other specified diabetes mellitus with diabetic polyneuropathy: Secondary | ICD-10-CM | POA: Diagnosis not present

## 2017-02-15 DIAGNOSIS — L851 Acquired keratosis [keratoderma] palmaris et plantaris: Secondary | ICD-10-CM | POA: Diagnosis not present

## 2017-02-18 ENCOUNTER — Encounter: Payer: Self-pay | Admitting: Family Medicine

## 2017-02-18 NOTE — Assessment & Plan Note (Signed)
Hyperlipidemia:Low fat diet discussed and encouraged.   Lipid Panel  Lab Results  Component Value Date   CHOL 162 10/15/2016   HDL 68 10/15/2016   LDLCALC 76 10/15/2016   TRIG 90 10/15/2016   CHOLHDL 2.4 10/15/2016   Controlled, no change in medication Updated lab needed at/ before next visit.

## 2017-02-18 NOTE — Assessment & Plan Note (Signed)
Sub optimal though adequate control, no med change DASH diet and commitment to daily physical activity for a minimum of 30 minutes discussed and encouraged, as a part of hypertension management. The importance of attaining a healthy weight is also discussed.  BP/Weight 02/11/2017 02/11/2017 01/24/2017 12/17/2016 10/25/2016 10/15/2016 7/73/7505  Systolic BP 107 125 247 998 001 239 359  Diastolic BP 72 72 57 84 97 74 50  Wt. (Lbs) 280.04 280 - 287 279.5 279 -  BMI 42.58 42.57 - 43.64 42.5 42.42 -  Some encounter information is confidential and restricted. Go to Review Flowsheets activity to see all data.

## 2017-02-18 NOTE — Assessment & Plan Note (Signed)
Assistive device for safe ambulation needs replacement , will send in script

## 2017-02-18 NOTE — Assessment & Plan Note (Signed)
Fall precautions reviewed and wheeled walker patient currently owns needs replacement. Will request new walker with wheels , may also benefit most from one with seat, will leave this to the discretion of the supplier and the patient

## 2017-02-18 NOTE — Assessment & Plan Note (Signed)
Reports increased anxiety, needs to return for therapy and to see psychiatry, I have encouraged her to schedule appointment after leaving this visit and she agrees She is not suicidal or homicidal, worried about her spouse's health

## 2017-02-18 NOTE — Assessment & Plan Note (Signed)
Relies on incontinence supplies, also requests raised toilet seat for when she does fet to the commode , which is appropriate, will send in order for same

## 2017-02-18 NOTE — Progress Notes (Signed)
Laura Atkins     MRN: [id number]      DOB: 1941/06/03   HPI Laura Atkins is here for follow up and re-evaluation of chronic medical conditions, medication management and review of any available recent lab and radiology data.  Preventive health is updated, specifically  Cancer screening and Immunization.   Questions or concerns regarding consultations or procedures which Laura Atkins has had in Laura interim are  Addressed.blood sugar is improved Laura Atkins denies any adverse reactions to current medications since Laura last visit.  C/o worsening left knee pain wants ortho eval C/o increased anxiety needs to return to therapy reqests new walker and elevated seat for commode  ROS Denies recent fever or chills. Denies sinus pressure, nasal congestion, ear pain or sore throat. Denies chest congestion, productive cough or wheezing. Denies chest pains, palpitations and leg swelling Denies abdominal pain, nausea, vomiting,diarrhea or constipation.   Denies dysuria, frequency, hesitancy or incontinence.  Denies headaches, seizures, numbness, or tingling.  Denies skin break down or rash.   PE  BP (!) 142/72   Pulse 84   Resp 16   Ht 5\' 8"  (1.727 m)   Wt 280 lb (127 kg)   SpO2 97%   BMI 42.57 kg/m   Patient alert and oriented and in no cardiopulmonary distress.  HEENT: No facial asymmetry, EOMI,   oropharynx pink and moist.  Neck decreased ROM no JVD, no mass.  Chest: Clear to auscultation bilaterally.  CVS: S1, S2 no murmurs, no S3.Regular rate.  ABD: Soft non tender.   Ext: No edema  MS: Decreased  ROM spine, shoulders, hips and knees. Left knee deformed, swollen and with crepitus Skin: Intact, no ulcerations or rash noted.  Psych: Good eye contact, normal affect. Memory intact not anxious or depressed appearing.  CNS: CN 2-12 intact, power,  normal throughout.no focal deficits noted.   Assessment & Plan  Knee pain, left Worsening pain and instability and left knee referred to  orthopedics for evaluation and management  Essential hypertension Sub optimal though adequate control, no med change DASH diet and commitment to daily physical activity for a minimum of 30 minutes discussed and encouraged, as a part of hypertension management. Laura importance of attaining a healthy weight is also discussed.  BP/Weight 02/11/2017 02/11/2017 01/24/2017 12/17/2016 10/25/2016 10/15/2016 3/47/4259  Systolic BP 563 875 643 329 518 841 660  Diastolic BP 72 72 57 84 97 74 50  Wt. (Lbs) 280.04 280 - 287 279.5 279 -  BMI 42.58 42.57 - 43.64 42.5 42.42 -  Some encounter information is confidential and restricted. Go to Review Flowsheets activity to see all data.       Diabetes mellitus type 2 in obese ([place]) Improved, managed by endo, now controlled  Limited mobility Fall precautions reviewed and wheeled walker patient currently owns needs replacement. Will request new walker with wheels , may also benefit most from one with seat, will leave this to Laura discretion of Laura supplier and Laura patient  UNSTEADY GAIT Assistive device for safe ambulation needs replacement , will send in script  [place] on incontinence supplies, also requests raised toilet seat for when she does fet to Laura commode , which is appropriate, will send in order for same  GERD Controlled, no change in medication   Hyperlipemia Hyperlipidemia:Low fat diet discussed and encouraged.   Lipid Panel  Lab Results  Component Value Date   CHOL 162 10/15/2016   HDL 68 10/15/2016   LDLCALC 76  10/15/2016   TRIG 90 10/15/2016   CHOLHDL 2.4 10/15/2016   Controlled, no change in medication Updated lab needed at/ before next visit.     Anxiety and depression Reports increased anxiety, needs to return for therapy and to see psychiatry, I have encouraged her to schedule appointment after leaving this visit and she agrees She is not suicidal or homicidal, worried about her spouse's health

## 2017-02-18 NOTE — Assessment & Plan Note (Signed)
Controlled, no change in medication  

## 2017-02-18 NOTE — Assessment & Plan Note (Signed)
Improved, managed by endo, now controlled

## 2017-02-20 ENCOUNTER — Ambulatory Visit (INDEPENDENT_AMBULATORY_CARE_PROVIDER_SITE_OTHER): Payer: PPO | Admitting: Orthopaedic Surgery

## 2017-02-28 ENCOUNTER — Other Ambulatory Visit: Payer: Self-pay

## 2017-02-28 DIAGNOSIS — R269 Unspecified abnormalities of gait and mobility: Secondary | ICD-10-CM

## 2017-02-28 MED ORDER — UNABLE TO FIND: 0 refills | Status: DC

## 2017-03-01 NOTE — Progress Notes (Signed)
This encounter was created in error - please disregard.

## 2017-03-01 NOTE — Addendum Note (Signed)
Addended byCalla Kicks on: 03/01/2017 01:28 PM   Modules accepted: Level of Service, SmartSet

## 2017-03-01 NOTE — Addendum Note (Signed)
Addended byCalla Kicks on: 03/01/2017 01:30 PM   Modules accepted: Level of Service, SmartSet

## 2017-03-04 ENCOUNTER — Other Ambulatory Visit: Payer: Self-pay | Admitting: Family Medicine

## 2017-03-05 ENCOUNTER — Other Ambulatory Visit: Payer: Self-pay | Admitting: *Deleted

## 2017-03-05 NOTE — Patient Outreach (Signed)
[facility] [place]) Care Management  03/05/2017  FLOYCE BUJAK Nov 19, 1940 [id number]  Telephone Screen  Referral Date: 03/01/17 Referral Source: MD office (Dr. Tula Nakayama) Referral Reason: Unable to afford Insulin, DME needs, Dental Resources Insurance: HTA   Outreach attempt #1 spoke to patient regarding referral from MD office. HIPAA verified with patient. Partial screening completed. Patient requested to be called back in the morning. She stated, she has "something Important to take care of".  Plan: RNCM will contact patient within the next business day.    [name], RN, BSN, MHA/MSL, [place] Telephonic Care Manager Coordinator [facility] Direct Phone: [phone] Toll Free: [phone] Fax: [phone]

## 2017-03-06 ENCOUNTER — Ambulatory Visit (INDEPENDENT_AMBULATORY_CARE_PROVIDER_SITE_OTHER): Payer: PPO

## 2017-03-06 ENCOUNTER — Encounter (INDEPENDENT_AMBULATORY_CARE_PROVIDER_SITE_OTHER): Payer: Self-pay | Admitting: Orthopaedic Surgery

## 2017-03-06 ENCOUNTER — Ambulatory Visit (INDEPENDENT_AMBULATORY_CARE_PROVIDER_SITE_OTHER): Payer: PPO | Admitting: Orthopaedic Surgery

## 2017-03-06 VITALS — BP 134/68 | HR 70 | Ht 68.0 in | Wt 293.0 lb

## 2017-03-06 DIAGNOSIS — M79642 Pain in left hand: Secondary | ICD-10-CM | POA: Diagnosis not present

## 2017-03-06 DIAGNOSIS — G8929 Other chronic pain: Secondary | ICD-10-CM | POA: Diagnosis not present

## 2017-03-06 DIAGNOSIS — M25562 Pain in left knee: Secondary | ICD-10-CM

## 2017-03-06 NOTE — Progress Notes (Signed)
Office Visit Note   Patient: Laura Atkins           Date of Birth: [birth year]           MRN: [id number] Visit Date: 03/06/2017              Requested by: Fayrene Helper, [place], [place], [place] [address] PCP: Fayrene Helper, MD   Assessment & Plan: Visit Diagnoses:  1. Pain of left hand   2. Chronic pain of left knee   Near end-stage osteoarthritis left knee with exacerbation after recent fall. Degenerative arthrosis base of left thumb. Morbid obesity. Insulin-dependent diabetes  Plan: Long discussion with Laura Atkins and her granddaughter regarding the 2 above issues. She does use a walker for balance and should continue using that for her left knee. I think full turn gel would be helpful and we'll order that. I am concerned about giving a cortisone because of her insulin-dependent diabetes mellitus. We will apply a splint to left hand for the arthritis  Follow-Up Instructions: Return if symptoms worsen or fail to improve.   Orders:  Orders Placed This Encounter  Procedures  . XR Hand Complete Left   No orders of the defined types were placed in this encounter.     Procedures: No procedures performed   Clinical Data: No additional findings.   Subjective: Chief Complaint  Patient presents with  . Left Knee - Pain    Laura Atkins is a 76 y o that presents with Left knee and Left hand pain when she fell in May and XR at [facility]. SHe hurt her hand when she tried to catch herself in the fall. No swelling in hand.  . Left Hand - Pain    Ambulates with a walker  Laura Atkins relates that she's been having problems with her left hand and left knee since she had a controlled fall several months ago. She does have an issue with balance and uses a walker around the house and even in the community. On that occasion in April she slowly fell to the ground kneeling on her left knee. She apparently was trying to break her fall with an outstretched left upper  extremity. She was seen at [facility] emergency room with x-rays of her knee negative for fracture but consistent with significant degenerative changes. I did review those on the PACS system and agree that she has degenerative changes in all 3 compartments with osteophyte formation and decreased joint space. CONSISTENT with near end-stage osteoarthritis.. I did not see any evidence of fracture. Films of her foot were also obtained at that time but this she's presently asymptomatic. She continues to complain of some pain in the area at the base of her thumb the left hand since the injury and probably exacerbated by the use of her walker.  HPI  Review of Systems   Objective: Vital Signs: BP 134/68   Pulse 70   Ht 5\' 8"  (1.727 m)   Wt 293 lb (132.9 kg)   BMI 44.55 kg/m   Physical Exam  Ortho Exam poor historian. Large knees. Positive crepitation with patella motion. Positive medial joint discomfort without instability. Venous stasis changes distally. Pulses not palpated. Left Foot was relatively warm. Skin otherwise intact. Painless range of motion of left hip. Straight leg raise negative. Full extension and about 95 of flexion. Left hand with minimally positive grind testing at the base of the thumb. Skin intact. Full range  of motion of hand. Normal opposition of thumb to little finger. No wrist pain. Some tenderness at the base of the thumb  Specialty Comments:  No specialty comments available.  Imaging: Xr Hand Complete Left  Result Date: 03/06/2017 Films of the left hand were obtained in 3 projections. There is considerable degenerative arthritis at the base of the thumb at the carpal metacarpal junction. There is partial subluxation radially. Some degenerative changes at the metacarpal phalangeal joint of the thumb. Diffuse bony demineralization consistent with either osteoporosis or osteopenia.    PMFS History: Patient Active Problem List   Diagnosis Date Noted  . Knee pain,  left 02/11/2017  . Iron deficiency 01/21/2017  . Vitamin D deficiency 10/15/2016  . Urinary frequency 10/15/2016  . Limited mobility 05/27/2016  . Diabetes mellitus type 2 in obese ([place]) 02/04/2016  . Sleep terror disorder 12/14/2015  . Fecal incontinence 12/13/2015  . Back pain of lumbar region with sciatica 04/15/2015  . Shoulder pain, right 04/14/2015  . Spondylosis, cervical, with myelopathy 04/14/2015  . PVD (peripheral vascular disease) ([place]) 01/28/2014  . OSA (obstructive sleep apnea) 04/10/2013  . Peripheral neuropathy 03/12/2013  . Hearing loss 11/06/2012  . UNSTEADY GAIT 11/07/2010  . Alpha thalassemia trait 01/26/2010  . NECK PAIN, CHRONIC 10/21/2008  . Hyperlipemia 04/21/2008  . Morbid obesity ([place]) 04/21/2008  . Anxiety and depression 04/21/2008  . Essential hypertension 04/21/2008  . GERD 04/21/2008  . Osteoarthritis of left knee 04/21/2008  . URINARY INCONTINENCE 04/21/2008   Past Medical History:  Diagnosis Date  . Alpha thalassemia trait 01/26/2010   02/2012: Nl CBC ex H&H-10.7/34.8, MCV-69   . Anemia   . Anxiety   . Anxiety and depression   . Depression   . Diabetes mellitus   . GERD (gastroesophageal reflux disease)   . Headache(784.0)   . Hyperlipidemia   . Hypertension   . Iron deficiency 01/21/2017  . Microcytic anemia 01/26/2010   02/2012: Nl CBC ex H&H-10.7/34.8, MCV-69   . Obesity   . Obstructive sleep apnea   . Osteoarthritis    Left knee; right shoulder; chronic neck and back pain  . Pruritus   . Seizures ([place])   . Tremor    This started months ago after her seizure progressing to very poor hand writing  . Urinary incontinence     Family History  Problem Relation Age of Onset  . Lung cancer Mother 47  . Kidney disease Father   . Diabetes Sister   . Keloids Brother   . ADD / ADHD Grandchild   . Bipolar disorder Grandchild   . Bipolar disorder Daughter   . Seizures Daughter   . Heart disease Daughter   . Kidney disease Son   .  Neuropathy Son   . Kidney disease Son   . Edema Daughter   . Breast cancer Daughter 27  . Allergies Daughter   . Alcohol abuse Neg Hx   . Drug abuse Neg Hx     Past Surgical History:  Procedure Laterality Date  . ABDOMINAL HYSTERECTOMY    . BREAST EXCISIONAL BIOPSY     Left; cyst  . CATARACT EXTRACTION W/ INTRAOCULAR LENS IMPLANT Left 09/07/2013  . CHOLECYSTECTOMY    . COLONOSCOPY    . COLONOSCOPY N/A 07/20/2015   Procedure: COLONOSCOPY;  Surgeon: Rogene Houston, MD;  Location: [facility] SUITE;  Service: Endoscopy;  Laterality: N/A;  930  . EYE SURGERY Left 09/07/2013   cataract   Social History   Occupational  History  . Disabled  Retired   Social History Main Topics  . Smoking status: Never Smoker  . Smokeless tobacco: Never Used  . Alcohol use No  . Drug use: No  . Sexual activity: Yes    Birth control/ protection: Surgical     Garald Balding, MD   Note - This record has been created using [employer].  Chart creation errors have been sought, but may not always  have been located. Such creation errors do not reflect on  the standard of medical care.

## 2017-03-08 ENCOUNTER — Other Ambulatory Visit: Payer: Self-pay | Admitting: *Deleted

## 2017-03-08 ENCOUNTER — Telehealth: Payer: Self-pay

## 2017-03-08 NOTE — Telephone Encounter (Signed)
Patient is requesting a call from you to discuss her last appt with you.   [phone]

## 2017-03-08 NOTE — Patient Outreach (Addendum)
[facility]) Care Management  03/08/2017  Laura Atkins June 18, 1941 [id number]   Telephone Screen  Referral Date: 03/01/17 Referral Source: MD office (Dr. Tula Nakayama) Referral Reason: Unable to afford Insulin, DME needs, Dental Resources Insurance: HTA  Outreach attempt #2 to patient.  No answer. RN CM left HIPAA compliant message along with contact info.   Plan: RN CM will make outreach attempt to patient within five business days.  [name], RN, BSN, MHA/MSL, [place] Telephonic Care Manager Coordinator [facility] Direct Phone: [phone] Toll Free: [phone] Fax: [phone]

## 2017-03-11 NOTE — Telephone Encounter (Signed)
Patient wanted to speak to [name] about the community resource referral that was sent in for her.

## 2017-03-12 ENCOUNTER — Ambulatory Visit (HOSPITAL_COMMUNITY): Payer: Self-pay | Admitting: Psychiatry

## 2017-03-13 ENCOUNTER — Other Ambulatory Visit (HOSPITAL_COMMUNITY): Payer: Self-pay | Admitting: Psychiatry

## 2017-03-14 ENCOUNTER — Telehealth (INDEPENDENT_AMBULATORY_CARE_PROVIDER_SITE_OTHER): Payer: Self-pay | Admitting: Orthopaedic Surgery

## 2017-03-14 ENCOUNTER — Telehealth: Payer: Self-pay

## 2017-03-14 ENCOUNTER — Other Ambulatory Visit: Payer: Self-pay | Admitting: Pharmacist

## 2017-03-14 NOTE — Patient Outreach (Signed)
[facility] [facility]) Care Management  03/14/2017  TRENA DUNAVAN 28-Mar-1941 [id number]  [facility] Pharmacist received referral from [place] with PCP office, regarding patient concerns with cost of medication including insulin and syringes.   Attempted to reach patient---home phone was not answered, and was unable to leave a message.  Cell phone number listed was answered and woman who answered call provided patient's phone number as [phone], number that was not answered---no PHI was exchanged during call.   Plan:  Unable to reach patient via phone, will make another outreach attempt to patient next week.   Karrie Meres, PharmD, [place] [phone]

## 2017-03-14 NOTE — Telephone Encounter (Signed)
Patient called requesting a prescription of the [place].  She stated that she was given samples of this medication before. [phone].  Thank you

## 2017-03-14 NOTE — Telephone Encounter (Signed)
Returning patient call regarding community resource. No answer no voicemail. -nr

## 2017-03-14 NOTE — Telephone Encounter (Signed)
Please advise 

## 2017-03-15 ENCOUNTER — Other Ambulatory Visit: Payer: Self-pay | Admitting: *Deleted

## 2017-03-15 ENCOUNTER — Ambulatory Visit: Payer: Self-pay | Admitting: *Deleted

## 2017-03-15 NOTE — Patient Outreach (Signed)
[place] [facility]) Care Management  03/15/2017  Laura Atkins 24-Apr-1941 [id number]  Telephone Screen  Referral Date: 03/01/17 Referral Source: MD office (Dr. Tula Nakayama) Referral Reason: Unable to afford Insulin, DME needs, Dental Resources Insurance: HTA   Outreach attempt # 3 spoke with patient regarding MD referral. HIPAA verified with patient.  Social:  Patient lives with her husband and family. She is independent/assist with ADLs. She is dependent upon her family for transportation to medical appointments. Patient uses a RW or cane, however her balance is unstable. She wants assistance with obtaining a walker with wheels.   Conditions: Patient reported, she had a fall about 3 weeks ago with injury. She voiced having soreness and pain in her left knee and left hand. She was provided a brace and pain medication. Patient reported having symptoms of depression. She spoke of her home situation being difficult to manage, majority of the time. She explained that she is coping the best that she can. She believes the staff at [facility] has been beneficial to her "low moments".   Medications: Patient reported being on greater than 10 medications. Patient was unable to afford her Relion/Novolin insulin when her prescription were being filled by [facility]. She completed a price check with [facility], per patient. She is currently getting her insulin prescription filled at [facility]. She voiced, being able to afford her insulin. Patient has questions about some of her medications. She doesn't understand the purpose of some medications.   Appointments: Patient most recent appointment was on 03/06/17 with her orthopedic doctor. Her last appointment with her PCP was on 02/11/17. She verbalized having a visit with [place] on 02/14/17.   Advanced Directives: Pt reported not having an Advanced Directive. She declined to receive any information about Advanced  Directives.  Consent: [facility] services reviewed and discussed with patient. Verbal consent for services given.   Plan: RN CM will send [facility] SW referral for possible assistance with safety evaluation and depression. RN CM advised patient to contact RNCM for any needs or concerns. RN CM advised patient to alert MD for any changes in conditions.   [name], RN, BSN, MHA/MSL, [place] Telephonic Care Manager Coordinator [facility] Direct Phone: [phone] Toll Free: [phone] Fax: [phone]

## 2017-03-18 ENCOUNTER — Telehealth: Payer: Self-pay | Admitting: *Deleted

## 2017-03-18 ENCOUNTER — Other Ambulatory Visit: Payer: Self-pay | Admitting: Pharmacist

## 2017-03-18 NOTE — Patient Outreach (Signed)
[facility]) Care Management  [place]   03/18/2017  Laura Atkins 1940-11-18 [id number]  Subjective:  Patient was referred to [place] Pharmacist for medication assistance evaluation.   Second outreach to patient today---HIPAA details verified, and purpose of call explained to patient.   Patient has past medical history significant for:  Hypertension, GERD, peripheral neuropathy, hyperlipemia, vitamin D deficiency, type 2 diabetes mellitus.    Patient agreed to discuss her medications.  She reports she receives her prescriptions from [place] and that her medications are blister packaged from what she described.    She denies concerns remembering to take her medications.    She reports she obtains her insulin from [facility], and at ~$25/vial it is more affordable---she denies needing medication assistance with insulin at time of call.    Most of the concerns patient discussed are related to durable medical equipment---including a walker with wheels and a knee brace.  She also asked about cost help with incontinent pads.    Objective:   Current Medications: Current Outpatient Prescriptions  Medication Sig Dispense Refill  . acetaminophen (EXTRA STRENGTH PAIN RELIEF) 500 MG tablet Take 500 mg by mouth every 6 (six) hours as needed for pain.    Marland Kitchen amLODipine (NORVASC) 10 MG tablet TAKE ONE TABLET BY MOUTH DAILY 90 tablet 1  . aspirin 81 MG tablet Take 81 mg by mouth daily.      Marland Kitchen atorvastatin (LIPITOR) 40 MG tablet TAKE ONE TABLET DAILY 90 tablet 1  . benazepril (LOTENSIN) 20 MG tablet TAKE ONE TABLET BY MOUTH DAILY 90 tablet 1  . diphenhydrAMINE (BENADRYL) 25 mg capsule Take 25 mg by mouth at bedtime. Reported on 10/18/2015    . gabapentin (NEURONTIN) 400 MG capsule TAKE 1 CAPSULE BY MOUTH TWICE DAILY AND 2 CAPSULES AT BEDTIME 360 capsule 1  . HYDROcodone-acetaminophen (NORCO/VICODIN) 5-325 MG tablet Take 1 tablet by mouth every 6 (six) hours as needed.  0   . insulin NPH-regular Human (NOVOLIN 70/30) (70-30) 100 UNIT/ML injection 55 units with breakfast and 20 units with supper, and syringes 2/day. 20 mL 11  . Multiple Vitamin (MULTIVITAMIN) capsule Take 1 capsule by mouth daily.    . naproxen (NAPROSYN) 500 MG tablet TAKE 1 TABLET BY MOUTH TWICE DAILY WITH A MEAL 30 tablet 0  . omeprazole (PRILOSEC) 20 MG capsule TAKE ONE CAPSULE BY MOUTH DAILY 90 capsule 1  . ONE TOUCH ULTRA TEST test strip USE TO TEST BLOOD SUGAR FOUR TIMES DAILY 150 each 2  . potassium chloride SA (K-DUR,KLOR-CON) 20 MEQ tablet TAKE 1 TABLET BY MOUTH TWICE DAILY 180 tablet 1  . sertraline (ZOLOFT) 100 MG tablet Take 1 tablet (100 mg total) by mouth 2 (two) times daily. 60 tablet 5  . torsemide (DEMADEX) 20 MG tablet TAKE 2 TABLETS TWICE DAILY (STOP LASIX) (Patient taking differently: 1 tablet Twice a Day) 120 tablet 6  . traMADol (ULTRAM) 50 MG tablet Take 1 tablet (50 mg total) by mouth every 12 (twelve) hours as needed. 40 tablet 0  . UNABLE TO FIND Walker x 1  DX unsteady gait, osteoarthritis of left knee 1 each 0  . UNABLE TO FIND Elevated [place] Seat x 1 DX: unsteady gait, back pain, osteoarthritis of left knee 1 each 0  . Vitamin D, Ergocalciferol, (DRISDOL) 50000 units CAPS capsule Take 1 capsule (50,000 Units total) by mouth every 7 (seven) days. (Patient not taking: Reported on 02/14/2017) 4 capsule 5   No current facility-administered  medications for this visit.     Functional Status: In your present state of health, do you have any difficulty performing the following activities: 02/11/2017  Hearing? Y  Vision? Y  Difficulty concentrating or making decisions? N  Walking or climbing stairs? Y  Dressing or bathing? Y  Doing errands, shopping? N  Preparing Food and eating ? N  Using the Toilet? N  In the past six months, have you accidently leaked urine? N  Do you have problems with loss of bowel control? N  Managing your Medications? N  Managing your Finances?  N  Housekeeping or managing your Housekeeping? Y  Some recent data might be hidden    Fall/Depression Screening: Fall Risk  03/05/2017 02/11/2017 10/15/2016  Falls in the past year? Yes Yes No  Number falls in past yr: 2 or more 2 or more -  Injury with Fall? Yes Yes -  Risk Factor Category  High Fall Risk High Fall Risk -  Risk for fall due to : Impaired balance/gait;Impaired mobility History of fall(s);Impaired balance/gait;Impaired mobility -  Follow up Falls evaluation completed;Falls prevention discussed Falls evaluation completed;Education provided -   [facility] 2/9 Scores 02/11/2017 10/15/2016 02/21/2016 02/07/2015 05/03/2014  PHQ - 2 Score 2 - 3 4 0  PHQ- 9 Score 5 - 11 7 8   Exception Documentation - Patient refusal - - -    Assessment:  Medication review per patient report and review of medication list in this chart:    Drugs sorted by system:  Neurologic/Psychologic: -gabapentin -sertraline   Cardiovascular: -amlodipine -aspirin -atorvastatin -benazepril -torsemide  Gastrointestinal: -omeprazole   Endocrine: -Novolin N 70/30  Pain: -acetaminophen as needed   Vitamins/Minerals: -vitamin D  -multivitamin  -potassium chloride  Miscellaneous: -diphenhydramine   Medications to avoid in the elderly:  -counseled patient on increased risk of falls and side effects with use of diphenhydramine   Other issues noted:   The following medications are on patient's medication and she reports not using them: -hydrocodone/acetaminophen -naproxen -tramadol   -Vitamin D is included on patient's medication list---she reports she is not taking it---please clarify if patient should take vitamin D or not.    -she asks about an over the counter diabetes supplement she is taking---she is unable to say what is in it---she was advised she should take to her next PCP appointment and discuss with her PCP.   Care coordination:   Patient was advised she will need to discuss need  for walker with wheels and a knee brace with her medical providers.    Discussed incontinent pads may not be covered by insurance so she may need to determine least expensive store to purchase these at.     Plan:  Will route note and barriers letter to PCP.   Will follow-up with patient via phone in the next two weeks  Will update [facility] RN of pharmacist call with patient.   Karrie Meres, PharmD, [place] [phone]

## 2017-03-18 NOTE — Telephone Encounter (Signed)
Patient left message on nurses line for Selena to call her back, patient stated she is waiting to hear back from selena "on the job she is working on". Patient's call back number is [phone].

## 2017-03-18 NOTE — Telephone Encounter (Signed)
Spoke to Karlye, said " the man from [place] called and got everything all straight for her." does not need anything from this office at this time.

## 2017-03-19 ENCOUNTER — Encounter: Payer: Self-pay | Admitting: *Deleted

## 2017-03-19 ENCOUNTER — Other Ambulatory Visit: Payer: Self-pay | Admitting: Licensed Clinical Social Worker

## 2017-03-19 ENCOUNTER — Encounter: Payer: Self-pay | Admitting: Pharmacist

## 2017-03-19 NOTE — Patient Outreach (Signed)
Assessment:  CSW Laura Atkins is transferring Laura Atkins. [name], DOB 19-Jul-2041, to [place] on 03/19/17.  Norva Riffle.Alysabeth Scalia MSW, LCSW Licensed Clinical Social Worker [facility] Care Management [phone]

## 2017-03-21 ENCOUNTER — Other Ambulatory Visit: Payer: Self-pay | Admitting: *Deleted

## 2017-03-21 ENCOUNTER — Other Ambulatory Visit (INDEPENDENT_AMBULATORY_CARE_PROVIDER_SITE_OTHER): Payer: Self-pay | Admitting: Orthopedic Surgery

## 2017-03-21 ENCOUNTER — Encounter (HOSPITAL_COMMUNITY): Payer: Self-pay | Admitting: Psychiatry

## 2017-03-21 ENCOUNTER — Ambulatory Visit (INDEPENDENT_AMBULATORY_CARE_PROVIDER_SITE_OTHER): Payer: PPO | Admitting: Psychiatry

## 2017-03-21 DIAGNOSIS — F32A Depression, unspecified: Secondary | ICD-10-CM

## 2017-03-21 DIAGNOSIS — F329 Major depressive disorder, single episode, unspecified: Secondary | ICD-10-CM | POA: Diagnosis not present

## 2017-03-21 MED ORDER — DICLOFENAC SODIUM 2 % TD SOLN: 1.0000 | Freq: Two times a day (BID) | TRANSDERMAL | 1 refills | Status: DC

## 2017-03-21 NOTE — Patient Outreach (Signed)
[facility]) Care Management  03/21/2017  Laura Atkins 1941/03/18 [id number]   CSW received referral from [place], [place] for safety evaluation & depression. CSW called & spoke with patient (ph#: [phone]) to setup initial home visit for next Tuesday, 03/26/17 at 1pm. Full assessment & note to follow.    Raynaldo Opitz, LCSW [facility]  Clinical Social Worker cell #: [phone]

## 2017-03-21 NOTE — Progress Notes (Signed)
    THERAPIST PROGRESS NOTE  Session Time:   Thursday 03/21/2017 11:20 AM - 12:10 PM  Participation Level: Active  Behavioral Response: CasualAlert/Anxious  Type of Therapy: Individual Therapy  Treatment Goals addressed: Improve coping skills to alleviate symptoms of depression (isolative behaviors, depressed mood, ruminating thoughts, and negative thinking)  Interventions: Supportive,   Summary: Laura Atkins is a 76 y.o. female who presents with a long-standing history of depression. She was referred to this practice for continuity of care as she was seen at [facility] but was having difficulty scheduling a time to see a therapist. She continues to experience depressed mood and excessive worry. She has been seen in this practice off and on for several years.   She last was seen by this clinician in November 2017. She is resuming services today per psychiatrist Dr. Alan Ripper recommendation as patient is experiencing increased depressed mood and excessive worry. She reports multiple stressors including one of her daughters being diagnosed with breast cancer and having a failed marriage last year. Her daughter is continuing to have treatment for cancer and patient expresses frustration as her daughter will not discuss what is going on with her with patient. She also expresses concerns about how this daughter is parenting patient's 48 year old granddaughter. Patient reports additional stress regarding her husband losing about 50 pounds in about 6 months and having GI issues. However, husband has had an endoscopy and colonoscopy. Biopsies were negative per patient's report.  Patient also continues to worry about a variety of other issues among other family members.   Suicidal/Homicidal: No  Therapist Response: Reviewed symptoms, discussed stressors, facilitated expression of patient's thoughts and feelings, explored with patient coping techniques she has used successfully in the past to cope with  worry, discussed patient's spirituality and ways to use to cope  Plan: Return again in 2-3 weeks.    Diagnosis: Axis I: Depressive Disorder NOS    Axis II: No diagnosis   Rumeal Cullipher, LCSW 03/21/2017

## 2017-03-21 NOTE — Telephone Encounter (Signed)
done

## 2017-03-22 ENCOUNTER — Encounter (HOSPITAL_COMMUNITY): Payer: Self-pay

## 2017-03-22 ENCOUNTER — Ambulatory Visit (HOSPITAL_COMMUNITY): Payer: Self-pay

## 2017-03-22 ENCOUNTER — Encounter (HOSPITAL_BASED_OUTPATIENT_CLINIC_OR_DEPARTMENT_OTHER): Payer: PPO | Admitting: Oncology

## 2017-03-22 ENCOUNTER — Encounter (HOSPITAL_COMMUNITY): Payer: PPO | Attending: Oncology

## 2017-03-22 ENCOUNTER — Other Ambulatory Visit (HOSPITAL_COMMUNITY): Payer: Self-pay

## 2017-03-22 VITALS — BP 148/51 | HR 72 | Temp 98.2°F | Resp 20 | Wt 288.0 lb

## 2017-03-22 DIAGNOSIS — N184 Chronic kidney disease, stage 4 (severe): Secondary | ICD-10-CM

## 2017-03-22 DIAGNOSIS — E611 Iron deficiency: Secondary | ICD-10-CM | POA: Diagnosis not present

## 2017-03-22 DIAGNOSIS — D563 Thalassemia minor: Secondary | ICD-10-CM | POA: Diagnosis not present

## 2017-03-22 LAB — CBC WITH DIFFERENTIAL/PLATELET
Basophils Absolute: 0 10*3/uL (ref 0.0–0.1)
Basophils Relative: 0 %
Eosinophils Absolute: 0.3 10*3/uL (ref 0.0–0.7)
Eosinophils Relative: 6 %
HCT: 31.5 % — ABNORMAL LOW (ref 36.0–46.0)
Hemoglobin: 9.5 g/dL — ABNORMAL LOW (ref 12.0–15.0)
Lymphocytes Relative: 44 %
Lymphs Abs: 2.2 10*3/uL (ref 0.7–4.0)
MCH: 20.9 pg — ABNORMAL LOW (ref 26.0–34.0)
MCHC: 30.2 g/dL (ref 30.0–36.0)
MCV: 69.2 fL — ABNORMAL LOW (ref 78.0–100.0)
Monocytes Absolute: 0.3 10*3/uL (ref 0.1–1.0)
Monocytes Relative: 5 %
Neutro Abs: 2.3 10*3/uL (ref 1.7–7.7)
Neutrophils Relative %: 45 %
Platelets: 158 10*3/uL (ref 150–400)
RBC: 4.55 MIL/uL (ref 3.87–5.11)
RDW: 16.4 % — ABNORMAL HIGH (ref 11.5–15.5)
WBC: 5 10*3/uL (ref 4.0–10.5)

## 2017-03-22 LAB — BASIC METABOLIC PANEL
Anion gap: 8 (ref 5–15)
BUN: 26 mg/dL — ABNORMAL HIGH (ref 6–20)
CO2: 30 mmol/L (ref 22–32)
Calcium: 9.2 mg/dL (ref 8.9–10.3)
Chloride: 99 mmol/L — ABNORMAL LOW (ref 101–111)
Creatinine, Ser: 1.05 mg/dL — ABNORMAL HIGH (ref 0.44–1.00)
GFR calc Af Amer: 60 mL/min — ABNORMAL LOW (ref >60)
GFR calc non Af Amer: 51 mL/min — ABNORMAL LOW (ref >60)
Glucose, Bld: 256 mg/dL — ABNORMAL HIGH (ref 65–99)
Potassium: 4.9 mmol/L (ref 3.5–5.1)
Sodium: 137 mmol/L (ref 135–145)

## 2017-03-22 LAB — IRON AND TIBC
Iron: 70 ug/dL (ref 28–170)
Saturation Ratios: 21 % (ref 10.4–31.8)
TIBC: 330 ug/dL (ref 250–450)
UIBC: 260 ug/dL

## 2017-03-22 LAB — FERRITIN: Ferritin: 141 ng/mL (ref 11–307)

## 2017-03-22 NOTE — Patient Instructions (Signed)
[place] at [facility] Discharge Instructions  RECOMMENDATIONS MADE BY THE CONSULTANT AND ANY TEST RESULTS WILL BE SENT TO YOUR REFERRING PHYSICIAN.  Exam and discussion today with Dr. Talbert Cage. Return as scheduled.   Thank you for choosing [place] at [facility] to provide your oncology and hematology care.  To afford each patient quality time with our provider, please arrive at least 15 minutes before your scheduled appointment time.    If you have a lab appointment with the [facility] please come in thru the  Main Entrance and check in at the main information desk  You need to re-schedule your appointment should you arrive 10 or more minutes late.  We strive to give you quality time with our providers, and arriving late affects you and other patients whose appointments are after yours.  Also, if you no show three or more times for appointments you may be dismissed from the clinic at the providers discretion.     Again, thank you for choosing [facility].  Our hope is that these requests will decrease the amount of time that you wait before being seen by our physicians.       _____________________________________________________________  Should you have questions after your visit to [facility], please contact our office at [phone] between the hours of 8:30 a.m. and 4:30 p.m.  Voicemails left after 4:30 p.m. will not be returned until the following business day.  For prescription refill requests, have your pharmacy contact our office.       Resources For Cancer Patients and their Caregivers ? American Cancer Society: Can assist with transportation, wigs, general needs, runs Look Good Feel Better.        770-834-3457 ? Cancer Care: Provides financial assistance, online support groups, medication/co-pay assistance.  1-800-813-HOPE (450) 324-0039) ? [place] Assists [place]  cancer patients and their families through emotional , educational and financial support.  3402736767 ? [place] DSS Where to apply for food stamps, Medicaid and utility assistance. 619-255-4015 ? [facility]: Transportation to medical appointments. (915)681-1142 ? Social Security Administration: May apply for disability if have a Stage IV cancer. 310 327 8032 857-501-0093 ? [employer], Disability and Transit Services: Assists with nutrition, care and transit needs. [place] Support Programs: @10RELATIVEDAYS @ > Cancer Support Group  2nd Tuesday of the month 1pm-2pm, Journey Room  > Creative Journey  3rd Tuesday of the month 1130am-1pm, Journey Room  > Look Good Feel Better  1st Wednesday of the month 10am-12 noon, Journey Room (Call [place] to register [phone])

## 2017-03-22 NOTE — Progress Notes (Signed)
Laura Atkins, [place], [place] [address]  No diagnosis found.  CURRENT THERAPY: Work-up.  INTERVAL HISTORY: Laura Atkins 76 y.o. female returns for followup of alpha thalassemia trait in the setting of chronic renal disease, Stage III.  Patient states that she feels well. She does have some fatigue however it is mild. She complains of bilateral lower extremity edema. Otherwise she has no complaints.  Review of Systems  Constitutional: Negative.  Negative for chills, fever and weight loss.  HENT: Negative.  Negative for nosebleeds.   Respiratory: Negative.  Negative for cough.   Cardiovascular: Positive for leg swelling. Negative for chest pain.  Gastrointestinal: Negative.  Negative for blood in stool and melena.  Genitourinary: Negative.  Negative for hematuria.  Musculoskeletal: Negative.   Skin: Negative.   Neurological: Negative.  Negative for weakness.  Endo/Heme/Allergies: Negative.   Psychiatric/Behavioral: Negative.     Past Medical History:  Diagnosis Date  . Alpha thalassemia trait 01/26/2010   02/2012: Nl CBC ex H&H-10.7/34.8, MCV-69   . Anemia   . Anxiety   . Anxiety and depression   . Depression   . Diabetes mellitus   . GERD (gastroesophageal reflux disease)   . Headache(784.0)   . Hyperlipidemia   . Hypertension   . Iron deficiency 01/21/2017  . Microcytic anemia 01/26/2010   02/2012: Nl CBC ex H&H-10.7/34.8, MCV-69   . Obesity   . Obstructive sleep apnea   . Osteoarthritis    Left knee; right shoulder; chronic neck and back pain  . Pruritus   . Seizures ([place])   . Tremor    This started months ago after her seizure progressing to very poor hand writing  . Urinary incontinence     Past Surgical History:  Procedure Laterality Date  . ABDOMINAL HYSTERECTOMY    . BREAST EXCISIONAL BIOPSY     Left; cyst  . CATARACT EXTRACTION W/ INTRAOCULAR LENS IMPLANT Left 09/07/2013  . CHOLECYSTECTOMY    . COLONOSCOPY      . COLONOSCOPY N/A 07/20/2015   Procedure: COLONOSCOPY;  Surgeon: Rogene Houston, MD;  Location: [facility];  Service: Endoscopy;  Laterality: N/A;  930  . EYE SURGERY Left 09/07/2013   cataract    Family History  Problem Relation Age of Onset  . Lung cancer Mother 7  . Kidney disease Father   . Diabetes Sister   . Keloids Brother   . ADD / ADHD Grandchild   . Bipolar disorder Grandchild   . Bipolar disorder Daughter   . Seizures Daughter   . Heart disease Daughter   . Kidney disease Son   . Neuropathy Son   . Kidney disease Son   . Edema Daughter   . Breast cancer Daughter 54  . Allergies Daughter   . Alcohol abuse Neg Hx   . Drug abuse Neg Hx     Social History   Social History  . Marital status: Married    Spouse name: saunders  . Number of children: 5  . Years of education: 12   Occupational History  . Disabled  Retired   Social History Main Topics  . Smoking status: Never Smoker  . Smokeless tobacco: Never Used  . Alcohol use No  . Drug use: No  . Sexual activity: Yes    Birth control/ protection: Surgical   Other Topics Concern  . None   Social History Narrative   Patient lives at  home with her husband [name].  Patient is retired.    Right handed.    Five Children.    Caffeine- 2 daily     PHYSICAL EXAMINATION  ECOG PERFORMANCE STATUS: 2 - Symptomatic, <50% confined to bed  Vitals:   03/22/17 1300  BP: (!) 148/51  Pulse: 72  Resp: 20  Temp: 98.2 F (36.8 C)   Physical Exam  Constitutional: She is oriented to person, place, and time and well-developed, well-nourished, and in no distress. No distress.  HENT:  Head: Normocephalic and atraumatic.  Mouth/Throat: No oropharyngeal exudate.  Eyes: Conjunctivae are normal. Pupils are equal, round, and reactive to light. No scleral icterus.  Neck: Normal range of motion. Neck supple. No JVD present.  Cardiovascular: Normal rate, regular rhythm and normal heart sounds.  Exam reveals no  gallop and no friction rub.   No murmur heard. Pulmonary/Chest: Breath sounds normal. No respiratory distress. She has no wheezes. She has no rales.  Abdominal: Soft. Bowel sounds are normal. She exhibits no distension. There is no tenderness. There is no guarding.  Musculoskeletal: She exhibits edema (2+ pitting edema bilateral). She exhibits no tenderness.  Lymphadenopathy:    She has no cervical adenopathy.  Neurological: She is alert and oriented to person, place, and time. No cranial nerve deficit.  Skin: Skin is warm and dry. No rash noted. No erythema. No pallor.  Psychiatric: Affect and judgment normal.     LABORATORY DATA: CBC    Component Value Date/Time   WBC 5.0 03/22/2017 1236   RBC 4.55 03/22/2017 1236   HGB 9.5 (L) 03/22/2017 1236   HCT 31.5 (L) 03/22/2017 1236   PLT 158 03/22/2017 1236   MCV 69.2 (L) 03/22/2017 1236   MCH 20.9 (L) 03/22/2017 1236   MCHC 30.2 03/22/2017 1236   RDW 16.4 (H) 03/22/2017 1236   LYMPHSABS 2.2 03/22/2017 1236   MONOABS 0.3 03/22/2017 1236   EOSABS 0.3 03/22/2017 1236   BASOSABS 0.0 03/22/2017 1236      Chemistry      Component Value Date/Time   NA 137 03/22/2017 1236   K 4.9 03/22/2017 1236   CL 99 (L) 03/22/2017 1236   CO2 30 03/22/2017 1236   BUN 26 (H) 03/22/2017 1236   CREATININE 1.05 (H) 03/22/2017 1236   CREATININE 1.17 (H) 10/15/2016 1148      Component Value Date/Time   CALCIUM 9.2 03/22/2017 1236   ALKPHOS 95 10/15/2016 1148   AST 21 10/15/2016 1148   ALT 21 10/15/2016 1148   BILITOT 0.3 10/15/2016 1148     Lab Results  Component Value Date   IRON 62 01/17/2017   TIBC 350 01/17/2017   FERRITIN 58 01/17/2017   Lab Results  Component Value Date   QZRAQTMA26 333 02/21/2016   Lab Results  Component Value Date   FOLATE 16.8 05/25/2016         PENDING LABS:   RADIOGRAPHIC STUDIES:  Xr Hand Complete Left  Result Date: 03/06/2017 Films of the left hand were obtained in 3 projections. There is  considerable degenerative arthritis at the base of the thumb at the carpal metacarpal junction. There is partial subluxation radially. Some degenerative changes at the metacarpal phalangeal joint of the thumb. Diffuse bony demineralization consistent with either osteoporosis or osteopenia.    PATHOLOGY:    ASSESSMENT:  Microcytic anemia secondary to alpha thalassemia trait and chronic kidney disease.  Patients with thalassemia trait always have a microcytic anemia. She does not have any evidence  of iron deficiency. Her hemoglobin has decreased a little from 10 g/dL 9.5 g/dL today. Patient's hemoglobin is chronically in the 9-10 g/dL range which has remained unchanged for the past few years. Continue to monitor patient is asymptomatic. Return to clinic in 6 months for follow-up with repeat labs.   All questions were answered. The patient knows to call the clinic with any problems, questions or concerns. We can certainly see the patient much sooner if necessary.  Patient and plan discussed with Dr. Twana First and she is in agreement with the aforementioned.   This note is electronically signed by: Twana First, MD 03/22/2017 3:48 PM

## 2017-03-22 NOTE — Addendum Note (Signed)
Addended by: Twana First on: 03/22/2017 03:53 PM   Modules accepted: Orders

## 2017-03-26 ENCOUNTER — Telehealth: Payer: Self-pay | Admitting: *Deleted

## 2017-03-26 ENCOUNTER — Encounter: Payer: Self-pay | Admitting: *Deleted

## 2017-03-26 ENCOUNTER — Ambulatory Visit: Payer: Self-pay | Admitting: *Deleted

## 2017-03-26 MED ORDER — UNABLE TO FIND: 0 refills | Status: DC

## 2017-03-26 NOTE — Patient Outreach (Signed)
[facility] ([facility]) Care Management  [facility] Care Manager  03/26/2017   Laura Atkins 08/25/1941 [id number]   Encounter Medications:      Outpatient Encounter Prescriptions as of 03/26/2017  Medication Sig  . acetaminophen (EXTRA STRENGTH PAIN RELIEF) 500 MG tablet Take 500 mg by mouth every 6 (six) hours as needed for pain.  . amLODipine (NORVASC) 10 MG tablet TAKE ONE TABLET BY MOUTH DAILY  . aspirin 81 MG tablet Take 81 mg by mouth daily.    . atorvastatin (LIPITOR) 40 MG tablet TAKE ONE TABLET DAILY  . benazepril (LOTENSIN) 20 MG tablet TAKE ONE TABLET BY MOUTH DAILY  . Diclofenac Sodium (PENNSAID) 2 % SOLN Place 1 Squirt onto the skin 2 (two) times daily.  . diphenhydrAMINE (BENADRYL) 25 mg capsule Take 25 mg by mouth at bedtime. Reported on 10/18/2015  . gabapentin (NEURONTIN) 400 MG capsule TAKE 1 CAPSULE BY MOUTH TWICE DAILY AND 2 CAPSULES AT BEDTIME  . HYDROcodone-acetaminophen (NORCO/VICODIN) 5-325 MG tablet Take 1 tablet by mouth every 6 (six) hours as needed.  . insulin NPH-regular Human (NOVOLIN 70/30) (70-30) 100 UNIT/ML injection 55 units with breakfast and 20 units with supper, and syringes 2/day.  . Multiple Vitamin (MULTIVITAMIN) capsule Take 1 capsule by mouth daily.  . naproxen (NAPROSYN) 500 MG tablet TAKE 1 TABLET BY MOUTH TWICE DAILY WITH A MEAL  . omeprazole (PRILOSEC) 20 MG capsule TAKE ONE CAPSULE BY MOUTH DAILY  . ONE TOUCH ULTRA TEST test strip USE TO TEST BLOOD SUGAR FOUR TIMES DAILY  . potassium chloride SA (K-DUR,KLOR-CON) 20 MEQ tablet TAKE 1 TABLET BY MOUTH TWICE DAILY  . sertraline (ZOLOFT) 100 MG tablet Take 1 tablet (100 mg total) by mouth 2 (two) times daily.  . torsemide (DEMADEX) 20 MG tablet TAKE 2 TABLETS TWICE DAILY (STOP LASIX) (Patient taking differently: 1 tablet Twice a Day)  . traMADol (ULTRAM) 50 MG tablet Take 1 tablet (50 mg total) by mouth every 12 (twelve) hours as needed.  . UNABLE TO FIND Walker x 1  DX unsteady gait,  osteoarthritis of left knee  . UNABLE TO FIND Elevated Toliet Seat x 1 DX: unsteady gait, back pain, osteoarthritis of left knee  . Vitamin D, Ergocalciferol, (DRISDOL) 50000 units CAPS capsule Take 1 capsule (50,000 Units total) by mouth every 7 (seven) days.   No facility-administered encounter medications on file as of 03/26/2017.     Functional Status:  In your present state of health, do you have any difficulty performing the following activities: 03/26/2017 02/11/2017  Hearing? N Y  Vision? Y Y  Difficulty concentrating or making decisions? N N  Walking or climbing stairs? N Y  Dressing or bathing? N Y  Doing errands, shopping? N N  Preparing Food and eating ? - N  Using the Toilet? - N  In the past six months, have you accidently leaked urine? - N  Do you have problems with loss of bowel control? - N  Managing your Medications? - N  Managing your Finances? - N  Housekeeping or managing your Housekeeping? - Y  Some recent data might be hidden    Fall/Depression Screening: Fall Risk  03/26/2017 03/05/2017 02/11/2017  Falls in the past year? Yes Yes Yes  Number falls in past yr: 2 or more 2 or more 2 or more  Injury with Fall? Yes Yes Yes  Risk Factor Category  High Fall Risk High Fall Risk High Fall Risk  Risk for fall due to : Impaired   balance/gait;Impaired mobility Impaired balance/gait;Impaired mobility History of fall(s);Impaired balance/gait;Impaired mobility  Follow up Falls prevention discussed;Education provided Falls evaluation completed;Falls prevention discussed Falls evaluation completed;Education provided   PHQ 2/9 Scores 03/26/2017 02/11/2017 10/15/2016 02/21/2016 02/07/2015 05/03/2014  PHQ - 2 Score 2 2 - 3 4 0  PHQ- 9 Score 3 5 - 11 7 8  Exception Documentation - - Patient refusal - - -    Assessment:   CSW received referral from [facility] Telephonic RNCM, Laura Atkins for home safety evaluation & depression on 03/19/17. CSW met with patient, her husband - Laura Atkins &  daughter - Laura Atkins at patient's home today to discuss [facility] and community resources available. Patient informed CSW that she already sees a psychiatrist - Dr. Deborah Atkins (patient has a 3 month follow-up scheduled for 8/21 at 11:15am) and an LCSW - Laura Atkins with whom she has a follow-up appointment scheduled for 7/17 at 11am. CSW completed admission intake questions, including depression screen PHQ-9 which patient scored 3 on today. Patient requested information on Advance Directives - CSW provided patient with packet and EMMI explaining information, encouraged her (along with her husband & daughter as well) to complete packet and take to a notary to have notarized with 2 witnesses and then call CSW when complete to have scanned into EPIC system.   CSW spoke with patient about home safety and noticed patient had a big rug in the living room - CSW encouraged patient to review booklet on home safety and checklist, which recommends no rugs as they are a trip/fall hazard. Patient states that the last time that she fell she was in the bathroom trying to put on a sweater when she fell. Patient's daughter is working with Dr. Simpson's office to get elevated toilet seat & rolling walker ordered to [facility] in [place]. Patient complained of pain in her knee and feels that the elevated toilet seat will help as it is hard for her to get up from the seated position.   Plan:      [facility] CM Care Plan Problem One     Most Recent Value  Care Plan Problem One  Patient needs assistance with community resources  Role Documenting the Problem One  Clinical Social Worker  Care Plan for Problem One  Active  [facility] Long Term Goal   Patient to get Advance Directives in place  [facility] Long Term Goal Start Date  03/26/17  Interventions for Problem One Long Term Goal  CSW provided patient with Advance Directive packet and EMMI explaining Advance Directives  [facility] CM Short Term Goal #1   Patient requesting information on  weight loss information.   [facility] CM Short Term Goal #1 Start Date  03/26/17  Interventions for Short Term Goal #1  CSW to have information mailed to patient on [employer]'s home fitness kits flier & information on silver sneakers     CSW will follow-up in 3 weeks to check on completion of Advance Directives & DME equipment needs.   Danyel Griess, LCSW [facility]  Clinical Social Worker cell #: [phone]  

## 2017-03-26 NOTE — Patient Outreach (Signed)
Laura Atkins) Care Management  [place]  03/26/2017   Laura Atkins October 03, 1940 [id number]   Encounter Medications:      Outpatient Encounter Prescriptions as of 03/26/2017  Medication Sig  . acetaminophen (EXTRA STRENGTH PAIN RELIEF) 500 MG tablet Take 500 mg by mouth every 6 (six) hours as needed for pain.  Marland Kitchen amLODipine (NORVASC) 10 MG tablet TAKE ONE TABLET BY MOUTH DAILY  . aspirin 81 MG tablet Take 81 mg by mouth daily.    Marland Kitchen atorvastatin (LIPITOR) 40 MG tablet TAKE ONE TABLET DAILY  . benazepril (LOTENSIN) 20 MG tablet TAKE ONE TABLET BY MOUTH DAILY  . Diclofenac Sodium (PENNSAID) 2 % SOLN Place 1 Squirt onto the skin 2 (two) times daily.  . diphenhydrAMINE (BENADRYL) 25 mg capsule Take 25 mg by mouth at bedtime. Reported on 10/18/2015  . gabapentin (NEURONTIN) 400 MG capsule TAKE 1 CAPSULE BY MOUTH TWICE DAILY AND 2 CAPSULES AT BEDTIME  . HYDROcodone-acetaminophen (NORCO/VICODIN) 5-325 MG tablet Take 1 tablet by mouth every 6 (six) hours as needed.  . insulin NPH-regular Human (NOVOLIN 70/30) (70-30) 100 UNIT/ML injection 55 units with breakfast and 20 units with supper, and syringes 2/day.  . Multiple Vitamin (MULTIVITAMIN) capsule Take 1 capsule by mouth daily.  . naproxen (NAPROSYN) 500 MG tablet TAKE 1 TABLET BY MOUTH TWICE DAILY WITH A MEAL  . omeprazole (PRILOSEC) 20 MG capsule TAKE ONE CAPSULE BY MOUTH DAILY  . ONE TOUCH ULTRA TEST test strip USE TO TEST BLOOD SUGAR FOUR TIMES DAILY  . potassium chloride SA (K-DUR,KLOR-CON) 20 MEQ tablet TAKE 1 TABLET BY MOUTH TWICE DAILY  . sertraline (ZOLOFT) 100 MG tablet Take 1 tablet (100 mg total) by mouth 2 (two) times daily.  Marland Kitchen torsemide (DEMADEX) 20 MG tablet TAKE 2 TABLETS TWICE DAILY (STOP LASIX) (Patient taking differently: 1 tablet Twice a Day)  . traMADol (ULTRAM) 50 MG tablet Take 1 tablet (50 mg total) by mouth every 12 (twelve) hours as needed.  Marland Kitchen UNABLE TO FIND Walker x 1  DX unsteady gait,  osteoarthritis of left knee  . UNABLE TO FIND Elevated Toliet Seat x 1 DX: unsteady gait, back pain, osteoarthritis of left knee  . Vitamin D, Ergocalciferol, (DRISDOL) 50000 units CAPS capsule Take 1 capsule (50,000 Units total) by mouth every 7 (seven) days.   No facility-administered encounter medications on file as of 03/26/2017.     Functional Status:  In your present state of health, do you have any difficulty performing the following activities: 03/26/2017 02/11/2017  Hearing? N Y  Vision? Y Y  Difficulty concentrating or making decisions? N N  Walking or climbing stairs? N Y  Dressing or bathing? N Y  Doing errands, shopping? N N  Preparing Food and eating ? - N  Using the Toilet? - N  In the past six months, have you accidently leaked urine? - N  Do you have problems with loss of bowel control? - N  Managing your Medications? - N  Managing your Finances? - N  Housekeeping or managing your Housekeeping? - Y  Some recent data might be hidden    Fall/Depression Screening: Fall Risk  03/26/2017 03/05/2017 02/11/2017  Falls in the past year? Yes Yes Yes  Number falls in past yr: 2 or more 2 or more 2 or more  Injury with Fall? Yes Yes Yes  Risk Factor Category  High Fall Risk High Fall Risk High Fall Risk  Risk for fall due to : Impaired  balance/gait;Impaired mobility Impaired balance/gait;Impaired mobility History of fall(s);Impaired balance/gait;Impaired mobility  Follow up Falls prevention discussed;Education provided Falls evaluation completed;Falls prevention discussed Falls evaluation completed;Education provided   [facility] 2/9 Scores 03/26/2017 02/11/2017 10/15/2016 02/21/2016 02/07/2015 05/03/2014  PHQ - 2 Score 2 2 - 3 4 0  PHQ- 9 Score 3 5 - _0 Exception Documentation - - Patient refusal - - -    Assessment:   CSW received referral from [place], Laura Atkins for home safety evaluation & depression on 03/19/17. CSW met with patient, her husband Laura Atkins &  daughter Laura Atkins at patient's home today to discuss [facility] and community resources available. Patient informed CSW that she already sees a psychiatrist - Laura Atkins (patient has a 3 month follow-up scheduled for 8/21 at 11:15am) and an [place] with whom she has a follow-up appointment scheduled for 7/17 at [place] completed admission intake questions, including depression screen PHQ-9 which patient scored 3 on today. Patient requested information on Advance Directives - CSW provided patient with packet and EMMI explaining information, encouraged her (along with her husband & daughter as well) to complete packet and take to a notary to have notarized with 2 witnesses and then call CSW when complete to have scanned into EPIC system.   CSW spoke with patient about home safety and noticed patient had a big rug in the living room - CSW encouraged patient to review booklet on home safety and checklist, which recommends no rugs as they are a trip/fall hazard. Patient states that the last time that she fell she was in the bathroom trying to put on a sweater when she fell. Patient's daughter is working with Dr. Griffin Atkins office to get elevated toilet seat & rolling walker ordered to [facility] in [place]. Patient complained of pain in her knee and feels that the elevated toilet seat will help as it is hard for her to get up from the seated position.   Plan:      [facility] CM Care Plan Problem One     Most Recent Value  Care Plan Problem One  Patient needs assistance with community resources  Role Documenting the Problem One  Clinical Social Worker  Care Plan for Problem One  Active  [facility] Long Term Goal   Patient to get Advance Directives in place  [place] Term Goal Start Date  03/26/17  Interventions for Problem One Long Term Goal  CSW provided patient with Advance Directive packet and EMMI explaining Advance Directives  [facility] CM Short Term Goal #1   Patient requesting information on  weight loss information.   [facility] CM Short Term Goal #1 Start Date  03/26/17  Interventions for Short Term Goal #1  CSW to have information mailed to patient on [employer]'s home fitness kits flier & information on silver sneakers     CSW will follow-up in 3 weeks to check on completion of Advance Directives & DME equipment needs.   Raynaldo Opitz, LCSW [facility]  Clinical Social Worker cell #: [phone]

## 2017-03-26 NOTE — Telephone Encounter (Signed)
Spoke with daughter, rx sent.

## 2017-03-26 NOTE — Telephone Encounter (Signed)
Patient went to get her walker at [facility] and it only has 2 wheels, patient needs a walker with 4 wheels. Per [name] (Patienr's daughter) Dr Moshe Cipro needs to send a new prescription so she can have a walker with 4 wheels. Please advise

## 2017-03-26 NOTE — Patient Outreach (Signed)
[facility] ([facility]) Care Management  [facility] Care Manager  03/26/2017   Laura Atkins 07/17/1941 [id number]   Encounter Medications:      Outpatient Encounter Prescriptions as of 03/26/2017  Medication Sig  . acetaminophen (EXTRA STRENGTH PAIN RELIEF) 500 MG tablet Take 500 mg by mouth every 6 (six) hours as needed for pain.  . amLODipine (NORVASC) 10 MG tablet TAKE ONE TABLET BY MOUTH DAILY  . aspirin 81 MG tablet Take 81 mg by mouth daily.    . atorvastatin (LIPITOR) 40 MG tablet TAKE ONE TABLET DAILY  . benazepril (LOTENSIN) 20 MG tablet TAKE ONE TABLET BY MOUTH DAILY  . Diclofenac Sodium (PENNSAID) 2 % SOLN Place 1 Squirt onto the skin 2 (two) times daily.  . diphenhydrAMINE (BENADRYL) 25 mg capsule Take 25 mg by mouth at bedtime. Reported on 10/18/2015  . gabapentin (NEURONTIN) 400 MG capsule TAKE 1 CAPSULE BY MOUTH TWICE DAILY AND 2 CAPSULES AT BEDTIME  . HYDROcodone-acetaminophen (NORCO/VICODIN) 5-325 MG tablet Take 1 tablet by mouth every 6 (six) hours as needed.  . insulin NPH-regular Human (NOVOLIN 70/30) (70-30) 100 UNIT/ML injection 55 units with breakfast and 20 units with supper, and syringes 2/day.  . Multiple Vitamin (MULTIVITAMIN) capsule Take 1 capsule by mouth daily.  . naproxen (NAPROSYN) 500 MG tablet TAKE 1 TABLET BY MOUTH TWICE DAILY WITH A MEAL  . omeprazole (PRILOSEC) 20 MG capsule TAKE ONE CAPSULE BY MOUTH DAILY  . ONE TOUCH ULTRA TEST test strip USE TO TEST BLOOD SUGAR FOUR TIMES DAILY  . potassium chloride SA (K-DUR,KLOR-CON) 20 MEQ tablet TAKE 1 TABLET BY MOUTH TWICE DAILY  . sertraline (ZOLOFT) 100 MG tablet Take 1 tablet (100 mg total) by mouth 2 (two) times daily.  . torsemide (DEMADEX) 20 MG tablet TAKE 2 TABLETS TWICE DAILY (STOP LASIX) (Patient taking differently: 1 tablet Twice a Day)  . traMADol (ULTRAM) 50 MG tablet Take 1 tablet (50 mg total) by mouth every 12 (twelve) hours as needed.  . UNABLE TO FIND Walker x 1  DX unsteady gait,  osteoarthritis of left knee  . UNABLE TO FIND Elevated Toliet Seat x 1 DX: unsteady gait, back pain, osteoarthritis of left knee  . Vitamin D, Ergocalciferol, (DRISDOL) 50000 units CAPS capsule Take 1 capsule (50,000 Units total) by mouth every 7 (seven) days.   No facility-administered encounter medications on file as of 03/26/2017.     Functional Status:  In your present state of health, do you have any difficulty performing the following activities: 03/26/2017 02/11/2017  Hearing? N Y  Vision? Y Y  Difficulty concentrating or making decisions? N N  Walking or climbing stairs? N Y  Dressing or bathing? N Y  Doing errands, shopping? N N  Preparing Food and eating ? - N  Using the Toilet? - N  In the past six months, have you accidently leaked urine? - N  Do you have problems with loss of bowel control? - N  Managing your Medications? - N  Managing your Finances? - N  Housekeeping or managing your Housekeeping? - Y  Some recent data might be hidden    Fall/Depression Screening: Fall Risk  03/26/2017 03/05/2017 02/11/2017  Falls in the past year? Yes Yes Yes  Number falls in past yr: 2 or more 2 or more 2 or more  Injury with Fall? Yes Yes Yes  Risk Factor Category  High Fall Risk High Fall Risk High Fall Risk  Risk for fall due to : Impaired   balance/gait;Impaired mobility Impaired balance/gait;Impaired mobility History of fall(s);Impaired balance/gait;Impaired mobility  Follow up Falls prevention discussed;Education provided Falls evaluation completed;Falls prevention discussed Falls evaluation completed;Education provided   PHQ 2/9 Scores 03/26/2017 02/11/2017 10/15/2016 02/21/2016 02/07/2015 05/03/2014  PHQ - 2 Score 2 2 - 3 4 0  PHQ- 9 Score 3 5 - 11 7 8  Exception Documentation - - Patient refusal - - -    Assessment:   CSW received referral from [facility] Telephonic RNCM, Juanita for home safety evaluation & depression on 03/19/17. CSW met with patient, her husband - [name] &  daughter - [name] at patient's home today to discuss [facility] and community resources available. Patient informed CSW that she already sees a psychiatrist - Dr. Deborah Ross (patient has a 3 month follow-up scheduled for 8/21 at 11:15am) and an LCSW - Peggy Bynum with whom she has a follow-up appointment scheduled for 7/17 at 11am. CSW completed admission intake questions, including depression screen PHQ-9 which patient scored 3 on today. Patient requested information on Advance Directives - CSW provided patient with packet and EMMI explaining information, encouraged her (along with her husband & daughter as well) to complete packet and take to a notary to have notarized with 2 witnesses and then call CSW when complete to have scanned into EPIC system.   CSW spoke with patient about home safety and noticed patient had a big rug in the living room - CSW encouraged patient to review booklet on home safety and checklist, which recommends no rugs as they are a trip/fall hazard. Patient states that the last time that she fell she was in the bathroom trying to put on a sweater when she fell. Patient's daughter is working with Dr. Simpson's office to get elevated toilet seat & rolling walker ordered to [facility] in [place]. Patient complained of pain in her knee and feels that the elevated toilet seat will help as it is hard for her to get up from the seated position.   Plan:      [facility] CM Care Plan Problem One     Most Recent Value  Care Plan Problem One  Patient needs assistance with community resources  Role Documenting the Problem One  Clinical Social Worker  Care Plan for Problem One  Active  [facility] Long Term Goal   Patient to get Advance Directives in place  [facility] Long Term Goal Start Date  03/26/17  Interventions for Problem One Long Term Goal  CSW provided patient with Advance Directive packet and EMMI explaining Advance Directives  [facility] CM Short Term Goal #1   Patient requesting information on  weight loss information.   [facility] CM Short Term Goal #1 Start Date  03/26/17  Interventions for Short Term Goal #1  CSW to have information mailed to patient on [employer]'s home fitness kits flier & information on silver sneakers     CSW will follow-up in 3 weeks to check on completion of Advance Directives & DME equipment needs.   Cherron Blitzer, LCSW [facility]  Clinical Social Worker cell #: [phone]  

## 2017-03-28 ENCOUNTER — Ambulatory Visit: Payer: Self-pay | Admitting: Pharmacist

## 2017-03-28 ENCOUNTER — Other Ambulatory Visit: Payer: Self-pay | Admitting: Pharmacist

## 2017-03-28 NOTE — Patient Outreach (Signed)
[facility]) Care Management  03/28/2017  TERI LEGACY 05-24-1941 [id number]  Successful phone follow-up to patient.   She denies further pharmacy related medication needs at this time.  She reports she is still trying get a walker---reviewed notes in chart and noted per 03/26/17 notes, PCP office had spoken to patient's daughter about walker with 4 wheels---she reports she knew this but then asked how she would stop it.  Advised patient she should discuss her durable medical equipment needs with her prescriber so that they can determine her needs.  She asked about cost---she was counseled [facility] Pharmacist doesn't know how much DME would cost and she would need to contact her insurance company or the location she is obtaining DME from to determine what the cost would be.   She denies medication affordability concerns at this time.    Plan:   Will close pharmacy episode at this time as patient denies mediation related needs.   Will update PCP patient continues to be concerned with DME needs and walker.   Patient aware to contact [facility] [place] Pharmacist if new pharmacy needs arise.   Karrie Meres, PharmD, [place] [phone]

## 2017-03-29 ENCOUNTER — Ambulatory Visit (INDEPENDENT_AMBULATORY_CARE_PROVIDER_SITE_OTHER): Payer: PPO | Admitting: Cardiology

## 2017-03-29 ENCOUNTER — Encounter: Payer: Self-pay | Admitting: Cardiology

## 2017-03-29 VITALS — BP 133/70 | HR 73 | Ht 68.0 in | Wt 288.0 lb

## 2017-03-29 DIAGNOSIS — R600 Localized edema: Secondary | ICD-10-CM | POA: Diagnosis not present

## 2017-03-29 DIAGNOSIS — E782 Mixed hyperlipidemia: Secondary | ICD-10-CM | POA: Diagnosis not present

## 2017-03-29 DIAGNOSIS — I10 Essential (primary) hypertension: Secondary | ICD-10-CM | POA: Diagnosis not present

## 2017-03-29 NOTE — Progress Notes (Signed)
Clinical Summary Ms. Guillen is a 76 y.o.female seen today for follow up of the following medical problems.   1. Leg swelling  - leg swelling comes and goes. Takes toresmide 40mg  in AM, will take additional as needed.   2. Leg pain - cramping pain in back of legs with exertion. No rest pain, no sores on feet - ABIs 12/2014 were normal  - still with pain at times, overall unchanged  3. HTN - compliant with meds  4. HL - compliant with statin.  Last panel Jan 2018 TC 162 TG 90 HDL 68 LDL 76  5. Alpha thalasemia - followed by hematology  6. CKD III   Past Medical History:  Diagnosis Date  . Alpha thalassemia trait 01/26/2010   02/2012: Nl CBC ex H&H-10.7/34.8, MCV-69   . Anemia   . Anxiety   . Anxiety and depression   . Depression   . Diabetes mellitus   . GERD (gastroesophageal reflux disease)   . Headache(784.0)   . Hyperlipidemia   . Hypertension   . Iron deficiency 01/21/2017  . Microcytic anemia 01/26/2010   02/2012: Nl CBC ex H&H-10.7/34.8, MCV-69   . Obesity   . Obstructive sleep apnea   . Osteoarthritis    Left knee; right shoulder; chronic neck and back pain  . Pruritus   . Seizures ([place])   . Tremor    This started months ago after her seizure progressing to very poor hand writing  . Urinary incontinence      Allergies  Allergen Reactions  . Penicillins Shortness Of Breath, Itching and Rash  . Prednisone Shortness Of Breath, Itching and Rash  . Propoxyphene N-Acetaminophen Itching and Nausea And Vomiting     Current Outpatient Prescriptions  Medication Sig Dispense Refill  . acetaminophen (EXTRA STRENGTH PAIN RELIEF) 500 MG tablet Take 500 mg by mouth every 6 (six) hours as needed for pain.    Marland Kitchen amLODipine (NORVASC) 10 MG tablet TAKE ONE TABLET BY MOUTH DAILY 90 tablet 1  . aspirin 81 MG tablet Take 81 mg by mouth daily.      Marland Kitchen atorvastatin (LIPITOR) 40 MG tablet TAKE ONE TABLET DAILY 90 tablet 1  . benazepril (LOTENSIN) 20 MG tablet TAKE ONE  TABLET BY MOUTH DAILY 90 tablet 1  . Diclofenac Sodium (PENNSAID) 2 % SOLN Place 1 Squirt onto the skin 2 (two) times daily. 1 Bottle 1  . diphenhydrAMINE (BENADRYL) 25 mg capsule Take 25 mg by mouth at bedtime. Reported on 10/18/2015    . gabapentin (NEURONTIN) 400 MG capsule TAKE 1 CAPSULE BY MOUTH TWICE DAILY AND 2 CAPSULES AT BEDTIME 360 capsule 1  . HYDROcodone-acetaminophen (NORCO/VICODIN) 5-325 MG tablet Take 1 tablet by mouth every 6 (six) hours as needed.  0  . insulin NPH-regular Human (NOVOLIN 70/30) (70-30) 100 UNIT/ML injection 55 units with breakfast and 20 units with supper, and syringes 2/day. 20 mL 11  . Multiple Vitamin (MULTIVITAMIN) capsule Take 1 capsule by mouth daily.    . naproxen (NAPROSYN) 500 MG tablet TAKE 1 TABLET BY MOUTH TWICE DAILY WITH A MEAL 30 tablet 0  . omeprazole (PRILOSEC) 20 MG capsule TAKE ONE CAPSULE BY MOUTH DAILY 90 capsule 1  . ONE TOUCH ULTRA TEST test strip USE TO TEST BLOOD SUGAR FOUR TIMES DAILY 150 each 2  . potassium chloride SA (K-DUR,KLOR-CON) 20 MEQ tablet TAKE 1 TABLET BY MOUTH TWICE DAILY 180 tablet 1  . sertraline (ZOLOFT) 100 MG tablet Take 1 tablet (  100 mg total) by mouth 2 (two) times daily. 60 tablet 5  . torsemide (DEMADEX) 20 MG tablet TAKE 2 TABLETS TWICE DAILY (STOP LASIX) (Patient taking differently: 1 tablet Twice a Day) 120 tablet 6  . traMADol (ULTRAM) 50 MG tablet Take 1 tablet (50 mg total) by mouth every 12 (twelve) hours as needed. 40 tablet 0  . UNABLE TO FIND Walker x 1  DX unsteady gait, osteoarthritis of left knee 1 each 0  . UNABLE TO FIND Elevated Toliet Seat x 1 DX: unsteady gait, back pain, osteoarthritis of left knee 1 each 0  . UNABLE TO FIND Wheeled walker ( 4 wheel) with seat  Dx unsteady gait 1 each 0  . Vitamin D, Ergocalciferol, (DRISDOL) 50000 units CAPS capsule Take 1 capsule (50,000 Units total) by mouth every 7 (seven) days. 4 capsule 5   No current facility-administered medications for this visit.       Past Surgical History:  Procedure Laterality Date  . ABDOMINAL HYSTERECTOMY    . BREAST EXCISIONAL BIOPSY     Left; cyst  . CATARACT EXTRACTION W/ INTRAOCULAR LENS IMPLANT Left 09/07/2013  . CHOLECYSTECTOMY    . COLONOSCOPY    . COLONOSCOPY N/A 07/20/2015   Procedure: COLONOSCOPY;  Surgeon: Rogene Houston, MD;  [facility];  Service: Endoscopy;  Laterality: N/A;  930  . EYE SURGERY Left 09/07/2013   cataract     Allergies  Allergen Reactions  . Penicillins Shortness Of Breath, Itching and Rash  . Prednisone Shortness Of Breath, Itching and Rash  . Propoxyphene N-Acetaminophen Itching and Nausea And Vomiting      Family History  Problem Relation Age of Onset  . Lung cancer Mother 29  . Kidney disease Father   . Diabetes Sister   . Keloids Brother   . ADD / ADHD Grandchild   . Bipolar disorder Grandchild   . Bipolar disorder Daughter   . Seizures Daughter   . Heart disease Daughter   . Kidney disease Son   . Neuropathy Son   . Kidney disease Son   . Edema Daughter   . Breast cancer Daughter 28  . Allergies Daughter   . Alcohol abuse Neg Hx   . Drug abuse Neg Hx      Social History Ms. Noh reports that she has never smoked. She has never used smokeless tobacco. Ms. Equihua reports that she does not drink alcohol.   Review of Systems CONSTITUTIONAL: No weight loss, fever, chills, weakness or fatigue.  HEENT: Eyes: No visual loss, blurred vision, double vision or yellow sclerae.No hearing loss, sneezing, congestion, runny nose or sore throat.  SKIN: No rash or itching.  CARDIOVASCULAR: per hpi RESPIRATORY: No shortness of breath, cough or sputum.  GASTROINTESTINAL: No anorexia, nausea, vomiting or diarrhea. No abdominal pain or blood.  GENITOURINARY: No burning on urination, no polyuria NEUROLOGICAL: No headache, dizziness, syncope, paralysis, ataxia, numbness or tingling in the extremities. No change in bowel or bladder control.   MUSCULOSKELETAL: No muscle, back pain, joint pain or stiffness.  LYMPHATICS: No enlarged nodes. No history of splenectomy.  PSYCHIATRIC: No history of depression or anxiety.  ENDOCRINOLOGIC: No reports of sweating, cold or heat intolerance. No polyuria or polydipsia.  Marland Kitchen   Physical Examination Vitals:   03/29/17 1532  BP: 133/70  Pulse: 73   Vitals:   03/29/17 1532  Weight: 288 lb (130.6 kg)  Height: 5\' 8"  (1.727 m)    Gen: resting comfortably, no acute distress  HEENT: no scleral icterus, pupils equal round and reactive, no palptable cervical adenopathy,  CV: RRR, no m/r/g, no jvd Resp: Clear to auscultation bilaterally GI: abdomen is soft, non-tender, non-distended, normal bowel sounds, no hepatosplenomegaly MSK: extremities are warm, no edema.  Skin: warm, no rash Neuro:  no focal deficits Psych: appropriate affect   Diagnostic Studies 01/2013 echo Study Conclusions  - Left ventricle: The cavity size was normal. There was mild concentric hypertrophy. Systolic function was normal. The estimated ejection fraction was in the range of 60% to 65%. Wall motion was normal; there were no regional wall motion abnormalities. - Aortic valve: Mildly calcified annulus. - Right ventricle: The cavity size was normal. Wall thickness was mildly increased. - Atrial septum: No defect or patent foramen ovale was identified.  01/2013 MPI IMPRESSION: Probably negative pharmacologic stress nuclear myocardial study revealing normal left ventricular size, normal left ventricular systolic function and no significant stress-induced EKG abnormalities. On scintigraphic imaging, there was a small and mild defect that likely represents variable breast attenuation; however, the possibility of modest anterior ischemia cannot be unequivocally excluded.  01/2013 Event monitor No significant arrhythmias  12/2014 ABIs: R 1.10 L 1.15  02/2015 Carotid US 1-39% bilateral  stenosis    Assessment and Plan  1. Chronic LE edema - echo 01/2013 with normal LVEF, diastolic function not reported but fairly normal parameters - continue torsemide   2. HTN - her bp is goal, continue current meds  3. Hyperlipidemia - lipids are at goal, continue current meds     F/u 6 months       Arnoldo Lenis, M.D

## 2017-03-29 NOTE — Patient Instructions (Signed)

## 2017-04-01 ENCOUNTER — Other Ambulatory Visit (INDEPENDENT_AMBULATORY_CARE_PROVIDER_SITE_OTHER): Payer: Self-pay

## 2017-04-01 MED ORDER — DICLOFENAC SODIUM 2 % TD SOLN: 1.0000 | Freq: Two times a day (BID) | TRANSDERMAL | 1 refills | Status: DC

## 2017-04-02 ENCOUNTER — Other Ambulatory Visit: Payer: Self-pay | Admitting: Family Medicine

## 2017-04-04 ENCOUNTER — Telehealth (HOSPITAL_COMMUNITY): Payer: Self-pay | Admitting: *Deleted

## 2017-04-04 NOTE — Telephone Encounter (Signed)
Voice mail has not been established.   provider out of office 04/09/17.

## 2017-04-09 ENCOUNTER — Ambulatory Visit (HOSPITAL_COMMUNITY): Payer: Self-pay | Admitting: Psychiatry

## 2017-04-11 ENCOUNTER — Other Ambulatory Visit (HOSPITAL_COMMUNITY): Payer: Self-pay | Admitting: Psychiatry

## 2017-04-16 ENCOUNTER — Other Ambulatory Visit: Payer: Self-pay | Admitting: *Deleted

## 2017-04-16 NOTE — Patient Outreach (Signed)
[facility] [facility]) Care Management  04/16/2017  Laura Atkins 1940-11-03 [id number]   CSW called & spoke with patient to follow-up on resources. Patient states that she has appointments next week with the LCSW & Diabetes doctor in [place] and her husband plans to transport her. Patient states that she still has the advance directive packet but has not had a chance to fill it out yet. CSW encouraged patient to complete it and made sure she was aware that she would have to sign it in front of a notary and 2 witnesses. CSW will follow-up in 3 weeks to remind patient as she would be able to take it to her doctors appointment or [facility] to have it scanned into EPIC system.    Raynaldo Opitz, LCSW [facility]  Clinical Social Worker cell #: [phone]

## 2017-04-18 ENCOUNTER — Encounter: Payer: Self-pay | Admitting: Endocrinology

## 2017-04-18 ENCOUNTER — Ambulatory Visit (INDEPENDENT_AMBULATORY_CARE_PROVIDER_SITE_OTHER): Payer: PPO | Admitting: Endocrinology

## 2017-04-18 VITALS — BP 139/57 | HR 73 | Wt 291.0 lb

## 2017-04-18 DIAGNOSIS — E1169 Type 2 diabetes mellitus with other specified complication: Secondary | ICD-10-CM

## 2017-04-18 DIAGNOSIS — E669 Obesity, unspecified: Secondary | ICD-10-CM | POA: Diagnosis not present

## 2017-04-18 LAB — POCT GLYCOSYLATED HEMOGLOBIN (HGB A1C): Hemoglobin A1C: 7.9

## 2017-04-18 MED ORDER — INSULIN NPH ISOPHANE & REGULAR (70-30) 100 UNIT/ML ~~LOC~~ SUSP: SUBCUTANEOUS | 11 refills | Status: DC

## 2017-04-18 NOTE — Patient Instructions (Addendum)
check your blood sugar 4 times a day: before the 3 meals, and at bedtime.  also check if you have symptoms of your blood sugar being too high or too low.  please keep a record of the readings and bring it to your next appointment here.  You can write it on any piece of paper.  please call us sooner if your blood sugar goes below 70, or if you have a lot of readings over 200.  Please continue the same insulin: 60 units with breakfast (45 on sundays), and 15 units with supper.  For your safety, take this insulin right before you eat, and only when you eat the first and last meals of the day.  On this type of insulin schedule, you should eat meals on a regular schedule.  If a meal is missed or significantly delayed, your blood sugar could go low.   Please come back for a follow-up appointment in 4 months.

## 2017-04-18 NOTE — Progress Notes (Signed)
Subjective:    Patient ID: Laura Atkins, female    DOB: [birth year], 76 y.o.   MRN: [id number]  HPI Pt returns for f/u of diabetes mellitus: DM type: insulin-requiring type 2.   Dx'ed: 5498 Complications: polyneuropathy, PAD, and foot ulcer.   Therapy: insulin since 2005.   GDM: never.  DKA: never Severe hypoglycemia: never.  Pancreatitis: never.   Other info: in early 2016, she changed to BID premixed insulin, due to poor results with multiple daily injections.  She takes human insulin, due to cost.   Interval history: she brings a record of her cbg's which I have reviewed today.  It varies from 89-200's.  It is in general higher as the day goes on, but not necessarily so.  She never misses the insulin.  pt states she feels well in general. Past Medical History:  Diagnosis Date  . Alpha thalassemia trait 01/26/2010   02/2012: Nl CBC ex H&H-10.7/34.8, MCV-69   . Anemia   . Anxiety   . Anxiety and depression   . Depression   . Diabetes mellitus   . GERD (gastroesophageal reflux disease)   . Headache(784.0)   . Hyperlipidemia   . Hypertension   . Iron deficiency 01/21/2017  . Microcytic anemia 01/26/2010   02/2012: Nl CBC ex H&H-10.7/34.8, MCV-69   . Obesity   . Obstructive sleep apnea   . Osteoarthritis    Left knee; right shoulder; chronic neck and back pain  . Pruritus   . Seizures ([place])   . Tremor    This started months ago after her seizure progressing to very poor hand writing  . Urinary incontinence     Past Surgical History:  Procedure Laterality Date  . ABDOMINAL HYSTERECTOMY    . BREAST EXCISIONAL BIOPSY     Left; cyst  . CATARACT EXTRACTION W/ INTRAOCULAR LENS IMPLANT Left 09/07/2013  . CHOLECYSTECTOMY    . COLONOSCOPY    . COLONOSCOPY N/A 07/20/2015   Procedure: COLONOSCOPY;  Surgeon: Rogene Houston, MD;  [facility];  Service: Endoscopy;  Laterality: N/A;  930  . EYE SURGERY Left 09/07/2013   cataract    Social History   Social History    . Marital status: Married    Spouse name: saunders  . Number of children: 5  . Years of education: 12   Occupational History  . Disabled  Retired   Social History Main Topics  . Smoking status: Never Smoker  . Smokeless tobacco: Never Used  . Alcohol use No  . Drug use: No  . Sexual activity: Yes    Birth control/ protection: Surgical   Other Topics Concern  . Not on file   Social History Narrative   Patient lives at home with her husband [name].  Patient is retired.    Right handed.    Five Children.    Caffeine- 2 daily    Current Outpatient Prescriptions on File Prior to Visit  Medication Sig Dispense Refill  . acetaminophen (EXTRA STRENGTH PAIN RELIEF) 500 MG tablet Take 500 mg by mouth every 6 (six) hours as needed for pain.    Marland Kitchen amLODipine (NORVASC) 10 MG tablet TAKE ONE TABLET BY MOUTH DAILY 90 tablet 1  . aspirin 81 MG tablet Take 81 mg by mouth daily.      Marland Kitchen atorvastatin (LIPITOR) 40 MG tablet TAKE ONE TABLET DAILY 90 tablet 1  . benazepril (LOTENSIN) 20 MG tablet TAKE ONE TABLET BY MOUTH DAILY 90 tablet 1  .  Diclofenac Sodium (PENNSAID) 2 % SOLN Place 1 Squirt onto the skin 2 (two) times daily. 1 Bottle 1  . diphenhydrAMINE (BENADRYL) 25 mg capsule Take 25 mg by mouth at bedtime. Reported on 10/18/2015    . gabapentin (NEURONTIN) 400 MG capsule TAKE ONE CAPSULE BY MOUTH EVERY MORNING and TAKE ONE CAPSULE AT NOON and TAKE TWO CAPSULES AT BEDTIME 360 capsule 1  . HYDROcodone-acetaminophen (NORCO/VICODIN) 5-325 MG tablet Take 1 tablet by mouth every 6 (six) hours as needed.  0  . Multiple Vitamin (MULTIVITAMIN) capsule Take 1 capsule by mouth daily.    . naproxen (NAPROSYN) 500 MG tablet TAKE 1 TABLET BY MOUTH TWICE DAILY WITH A MEAL 30 tablet 0  . omeprazole (PRILOSEC) 20 MG capsule TAKE ONE CAPSULE BY MOUTH DAILY 90 capsule 1  . ONE TOUCH ULTRA TEST test strip USE TO TEST BLOOD SUGAR FOUR TIMES DAILY 150 each 2  . potassium chloride SA (K-DUR,KLOR-CON) 20 MEQ  tablet TAKE 1 TABLET BY MOUTH TWICE DAILY 180 tablet 1  . sertraline (ZOLOFT) 100 MG tablet Take 1 tablet (100 mg total) by mouth 2 (two) times daily. 60 tablet 5  . torsemide (DEMADEX) 20 MG tablet TAKE 2 TABLETS TWICE DAILY (STOP LASIX) (Patient taking differently: 1 tablet Twice a Day) 120 tablet 6  . traMADol (ULTRAM) 50 MG tablet Take 1 tablet (50 mg total) by mouth every 12 (twelve) hours as needed. 40 tablet 0  . UNABLE TO FIND Walker x 1  DX unsteady gait, osteoarthritis of left knee 1 each 0  . UNABLE TO FIND Elevated Toliet Seat x 1 DX: unsteady gait, back pain, osteoarthritis of left knee 1 each 0  . UNABLE TO FIND Wheeled walker ( 4 wheel) with seat  Dx unsteady gait 1 each 0  . Vitamin D, Ergocalciferol, (DRISDOL) 50000 units CAPS capsule Take 1 capsule (50,000 Units total) by mouth every 7 (seven) days. 4 capsule 5   No current facility-administered medications on file prior to visit.     Allergies  Allergen Reactions  . Penicillins Shortness Of Breath, Itching and Rash  . Prednisone Shortness Of Breath, Itching and Rash  . Propoxyphene N-Acetaminophen Itching and Nausea And Vomiting    Family History  Problem Relation Age of Onset  . Lung cancer Mother 47  . Kidney disease Father   . Diabetes Sister   . Keloids Brother   . ADD / ADHD Grandchild   . Bipolar disorder Grandchild   . Bipolar disorder Daughter   . Seizures Daughter   . Heart disease Daughter   . Kidney disease Son   . Neuropathy Son   . Kidney disease Son   . Edema Daughter   . Breast cancer Daughter 74  . Allergies Daughter   . Alcohol abuse Neg Hx   . Drug abuse Neg Hx     BP (!) 139/57   Pulse 73   Wt 291 lb (132 kg)   SpO2 97%   BMI 44.25 kg/m    Review of Systems She denies hypoglycemia.     Objective:   Physical Exam VITAL SIGNS:  See vs page GENERAL: no distress.  Morbid obesity.  Pulses: foot pulses are absent bilat (prob due to edema).   MSK: no deformity of the feet or  ankles.  CV: 1+ bilat edema of the legs.  Skin:  no ulcer on the feet or ankles.  Healed ulcer at the right heel. normal color and temp on the feet and ankles  Neuro: sensation is intact to touch on the feet and ankles.   ZRA:QTMAU is bilateral onychomycosis of the toenails.   Lab Results  Component Value Date   HGBA1C 7.9 04/18/2017      Assessment & Plan:  Insulin-requiring type 2 DM, with PAD: this is the best control this pt should aim for, given this regimen, which does match insulin to her changing needs throughout the day OA: this limits exercise rx  Patient Instructions  check your blood sugar 4 times a day: before the 3 meals, and at bedtime.  also check if you have symptoms of your blood sugar being too high or too low.  please keep a record of the readings and bring it to your next appointment here.  You can write it on any piece of paper.  please call us sooner if your blood sugar goes below 70, or if you have a lot of readings over 200.  Please continue the same insulin: 60 units with breakfast (45 on sundays), and 15 units with supper.  For your safety, take this insulin right before you eat, and only when you eat the first and last meals of the day.  On this type of insulin schedule, you should eat meals on a regular schedule.  If a meal is missed or significantly delayed, your blood sugar could go low.   Please come back for a follow-up appointment in 4 months.

## 2017-04-24 ENCOUNTER — Ambulatory Visit (INDEPENDENT_AMBULATORY_CARE_PROVIDER_SITE_OTHER): Payer: PPO | Admitting: Psychiatry

## 2017-04-24 ENCOUNTER — Encounter (HOSPITAL_COMMUNITY): Payer: Self-pay | Admitting: Psychiatry

## 2017-04-24 ENCOUNTER — Ambulatory Visit (HOSPITAL_COMMUNITY): Payer: Self-pay | Admitting: Psychiatry

## 2017-04-24 DIAGNOSIS — F32A Depression, unspecified: Secondary | ICD-10-CM

## 2017-04-24 DIAGNOSIS — F329 Major depressive disorder, single episode, unspecified: Secondary | ICD-10-CM | POA: Diagnosis not present

## 2017-04-24 NOTE — Progress Notes (Signed)
    THERAPIST PROGRESS NOTE  Session Time:   Wednesday 04/24/2017 4:15 PM -  5:00 PM  Participation Level: Active  Behavioral Response: CasualAlert/Anxious  Type of Therapy: Individual Therapy  Treatment Goals addressed: Improve coping skills to alleviate symptoms of depression (isolative behaviors, depressed mood, ruminating thoughts, and negative thinking)  Interventions: Supportive,   Summary: Laura Atkins is a 76 y.o. female who presents with a long-standing history of depression. She was referred to this practice for continuity of care as she was seen at [facility] but was having difficulty scheduling a time to see a therapist. She continues to experience depressed mood and excessive worry. She has been seen in this practice off and on for several years.   She discontinued treatment in November 2017 but resumed services in June 2018 per psychiatrist Dr. Alan Ripper recommendation due to  increased depressed mood and excessive worry. She reports multiple stressors including one of her daughters being diagnosed with breast cancer and having a failed marriage last year. Her daughter is continuing to have treatment for cancer and patient expresses frustration as her daughter will not discuss what is going on with her with patient. She also expresses concerns about how this daughter is parenting patient's 8 year old granddaughter. Patient reports additional stress regarding her husband losing about 50 pounds in about 6 months and having GI issues. However, husband has had an endoscopy and colonoscopy. Biopsies were negative per patient's report.  Patient also continues to worry about a variety of other issues among other family members.   Patient last was seen about 4 weeks ago. She reports less depressed mood but continued anxiety and worry since last session. She is particularly concerned about her daughter who has breast cancer. This daughter along with her 72 year old daughter now reside with  patient and her husband. She expresses worry about her daughter's health although she says her daughter continues to work and  seems to be doing well. She worries about her granddaughter making the right choices including doing the right thing is to prepare for graduation from high school and going to college. She also continues to worry about husband's health as he is not as active as he used to be. She also still is concerned that he may have cancer although this has been ruled out by medical tests. .  Suicidal/Homicidal: No  Therapist Response: Reviewed symptoms, discussed stressors, facilitated expression of patient's thoughts and feelings, assisted patient identify the changes in the family dynamics since her daughter and granddaughter have moved in with patient and her husband, assisted patient identify and clarify her role as a grandmother and mother, discussed boundary issues and ways patient can respect boundaries in the relationship with her daughter and granddaughter, assisted patient identify realistic expectations of self, assisted patient to begin to identify, challenge, and replace negative thoughts with healthy alternatives regarding concerns about husband   Plan: Return again in 2-3 weeks.    Diagnosis: Axis I: Depressive Disorder NOS    Axis II: No diagnosis   Alayla Dethlefs, LCSW 04/24/2017

## 2017-04-26 DIAGNOSIS — L851 Acquired keratosis [keratoderma] palmaris et plantaris: Secondary | ICD-10-CM | POA: Diagnosis not present

## 2017-04-26 DIAGNOSIS — E1342 Other specified diabetes mellitus with diabetic polyneuropathy: Secondary | ICD-10-CM | POA: Diagnosis not present

## 2017-04-26 DIAGNOSIS — B351 Tinea unguium: Secondary | ICD-10-CM | POA: Diagnosis not present

## 2017-05-01 ENCOUNTER — Other Ambulatory Visit: Payer: Self-pay | Admitting: Family Medicine

## 2017-05-08 ENCOUNTER — Emergency Department (HOSPITAL_COMMUNITY)
Admission: EM | Admit: 2017-05-08 | Discharge: 2017-05-08 | Disposition: A | Discharge status: Home / Self Care | Payer: PPO | Attending: Emergency Medicine | Admitting: Emergency Medicine

## 2017-05-08 ENCOUNTER — Encounter (HOSPITAL_COMMUNITY): Payer: Self-pay | Admitting: Emergency Medicine

## 2017-05-08 DIAGNOSIS — Z79899 Other long term (current) drug therapy: Secondary | ICD-10-CM | POA: Diagnosis not present

## 2017-05-08 DIAGNOSIS — Z7982 Long term (current) use of aspirin: Secondary | ICD-10-CM | POA: Insufficient documentation

## 2017-05-08 DIAGNOSIS — I10 Essential (primary) hypertension: Secondary | ICD-10-CM | POA: Diagnosis not present

## 2017-05-08 DIAGNOSIS — E119 Type 2 diabetes mellitus without complications: Secondary | ICD-10-CM | POA: Insufficient documentation

## 2017-05-08 DIAGNOSIS — Z794 Long term (current) use of insulin: Secondary | ICD-10-CM | POA: Diagnosis not present

## 2017-05-08 DIAGNOSIS — L299 Pruritus, unspecified: Secondary | ICD-10-CM | POA: Diagnosis not present

## 2017-05-08 LAB — CBG MONITORING, ED: Glucose-Capillary: 183 mg/dL — ABNORMAL HIGH (ref 65–99)

## 2017-05-08 NOTE — Discharge Instructions (Signed)
Please watch what you're eating to make sure that you did not eat something today or drinks something today that made you have the itching. You can continue the Benadryl for itching as needed. Recheck if you get swelling of your toe, the swelling extends into your foot, you start getting a rash or itching over your body or you have difficulty swallowing or breathing.

## 2017-05-08 NOTE — ED Provider Notes (Signed)
[facility] DEPT Provider Note   CSN: [id number] Arrival date & time: 05/08/17  0306  Time seen 4:45 AM   History   Chief Complaint Chief Complaint  Patient presents with  . Pruritis    Toe itching    HPI Laura Atkins is a 76 y.o. female.  HPI  Patient states this evening before 7 PM she started having itching of her whole left great toe. She did not have itching anywhere else. There is no rash or swelling or redness. She states "I thought I was going to go crazy".She denies any new exposures such as new soaps, detergents, new shoes, any change in her foods or diet. She states she took Benadryl 25 mg 3 and now her itching is gone. She states she had something similar last year when she had itching of her right great toe. At that time it was occurring after she drinks a specific brand of fruit juice which she has stopped drinking. Patient has diabetes and states last week her CBGs were getting too low. She states now when she goes to church she does not take as much of her medication. She denies rash anywhere else, itching anywhere else, any other problem. She states her CBG was 155 tonight.  PCP Fayrene Helper, MD Endocrinologist  Dr Loanne Drilling  Past Medical History:  Diagnosis Date  . Alpha thalassemia trait 01/26/2010   02/2012: Nl CBC ex H&H-10.7/34.8, MCV-69   . Anemia   . Anxiety   . Anxiety and depression   . Depression   . Diabetes mellitus   . GERD (gastroesophageal reflux disease)   . Headache(784.0)   . Hyperlipidemia   . Hypertension   . Iron deficiency 01/21/2017  . Microcytic anemia 01/26/2010   02/2012: Nl CBC ex H&H-10.7/34.8, MCV-69   . Obesity   . Obstructive sleep apnea   . Osteoarthritis    Left knee; right shoulder; chronic neck and back pain  . Pruritus   . Seizures ([place])   . Tremor    This started months ago after her seizure progressing to very poor hand writing  . Urinary incontinence     Patient Active Problem List   Diagnosis Date Noted    . Knee pain, left 02/11/2017  . Iron deficiency 01/21/2017  . Vitamin D deficiency 10/15/2016  . Urinary frequency 10/15/2016  . Limited mobility 05/27/2016  . Diabetes mellitus type 2 in obese ([place]) 02/04/2016  . Sleep terror disorder 12/14/2015  . Fecal incontinence 12/13/2015  . Back pain of lumbar region with sciatica 04/15/2015  . Shoulder pain, right 04/14/2015  . Spondylosis, cervical, with myelopathy 04/14/2015  . PVD (peripheral vascular disease) ([place]) 01/28/2014  . OSA (obstructive sleep apnea) 04/10/2013  . Peripheral neuropathy 03/12/2013  . Hearing loss 11/06/2012  . UNSTEADY GAIT 11/07/2010  . Alpha thalassemia trait 01/26/2010  . NECK PAIN, CHRONIC 10/21/2008  . Hyperlipemia 04/21/2008  . Morbid obesity ([place]) 04/21/2008  . Anxiety and depression 04/21/2008  . Essential hypertension 04/21/2008  . GERD 04/21/2008  . Osteoarthritis of left knee 04/21/2008  . URINARY INCONTINENCE 04/21/2008    Past Surgical History:  Procedure Laterality Date  . ABDOMINAL HYSTERECTOMY    . BREAST EXCISIONAL BIOPSY     Left; cyst  . CATARACT EXTRACTION W/ INTRAOCULAR LENS IMPLANT Left 09/07/2013  . CHOLECYSTECTOMY    . COLONOSCOPY    . COLONOSCOPY N/A 07/20/2015   Procedure: COLONOSCOPY;  Surgeon: Rogene Houston, MD;  Location: [facility];  Service: Endoscopy;  Laterality: N/A;  930  . EYE SURGERY Left 09/07/2013   cataract    OB History    No data available       Home Medications    Prior to Admission medications   Medication Sig Start Date End Date Taking? Authorizing Provider  acetaminophen (EXTRA STRENGTH PAIN RELIEF) 500 MG tablet Take 500 mg by mouth every 6 (six) hours as needed for pain.    [provider]  amLODipine (NORVASC) 10 MG tablet TAKE ONE TABLET BY MOUTH DAILY 03/04/17   Fayrene Helper, MD  aspirin 81 MG tablet Take 81 mg by mouth daily.      [provider]  atorvastatin (LIPITOR) 40 MG tablet TAKE ONE TABLET DAILY 03/04/17    Fayrene Helper, MD  benazepril (LOTENSIN) 20 MG tablet TAKE ONE TABLET BY MOUTH DAILY 03/04/17   Fayrene Helper, MD  Diclofenac Sodium (PENNSAID) 2 % SOLN Place 1 Squirt onto the skin 2 (two) times daily. 04/01/17   Garald Balding, MD  diphenhydrAMINE (BENADRYL) 25 mg capsule Take 25 mg by mouth at bedtime. Reported on 10/18/2015    [provider]  gabapentin (NEURONTIN) 400 MG capsule TAKE ONE CAPSULE BY MOUTH EVERY MORNING and TAKE ONE CAPSULE AT NOON and TAKE TWO CAPSULES AT BEDTIME 04/02/17   Fayrene Helper, MD  HYDROcodone-acetaminophen (NORCO/VICODIN) 5-325 MG tablet Take 1 tablet by mouth every 6 (six) hours as needed. 12/18/16   [provider]  insulin NPH-regular Human (NOVOLIN 70/30) (70-30) 100 UNIT/ML injection 60 units with breakfast and 15 units with supper, and syringes 2/day. 04/18/17   Renato Shin, MD  Multiple Vitamin (MULTIVITAMIN) capsule Take 1 capsule by mouth daily.    [provider]  naproxen (NAPROSYN) 500 MG tablet TAKE 1 TABLET BY MOUTH TWICE DAILY WITH A MEAL 04/02/16   Fayrene Helper, MD  omeprazole (PRILOSEC) 20 MG capsule TAKE ONE CAPSULE BY MOUTH DAILY 03/04/17   Fayrene Helper, MD  ONE TOUCH ULTRA TEST test strip USE TO TEST BLOOD SUGAR FOUR TIMES DAILY 10/10/16   Renato Shin, MD  potassium chloride SA (K-DUR,KLOR-CON) 20 MEQ tablet TAKE ONE TABLET BY MOUTH TWICE DAILY 05/01/17   Fayrene Helper, MD  sertraline (ZOLOFT) 100 MG tablet Take 1 tablet (100 mg total) by mouth 2 (two) times daily. 02/14/17   Cloria Spring, MD  torsemide (DEMADEX) 20 MG tablet TAKE 2 TABLETS TWICE DAILY (STOP LASIX) Patient taking differently: 1 tablet Twice a Day 09/10/16   Arnoldo Lenis, MD  traMADol (ULTRAM) 50 MG tablet Take 1 tablet (50 mg total) by mouth every 12 (twelve) hours as needed. 10/15/16   Fayrene Helper, MD  UNABLE TO FIND Walker x 1  DX unsteady gait, osteoarthritis of left knee 02/28/17   Fayrene Helper, MD  UNABLE TO FIND Elevated [place] Seat x 1 DX: unsteady gait, back pain, osteoarthritis of left knee 02/28/17   Fayrene Helper, MD  UNABLE TO FIND Wheeled walker ( 4 wheel) with seat  Dx unsteady gait 03/26/17   Fayrene Helper, MD  Vitamin D, Ergocalciferol, (DRISDOL) 50000 units CAPS capsule Take 1 capsule (50,000 Units total) by mouth every 7 (seven) days. 03/16/16   Fayrene Helper, MD    Family History Family History  Problem Relation Age of Onset  . Lung cancer Mother 3  . Kidney disease Father   . Diabetes Sister   . Keloids Brother   . ADD /  ADHD Grandchild   . Bipolar disorder Grandchild   . Bipolar disorder Daughter   . Seizures Daughter   . Heart disease Daughter   . Kidney disease Son   . Neuropathy Son   . Kidney disease Son   . Edema Daughter   . Breast cancer Daughter 16  . Allergies Daughter   . Alcohol abuse Neg Hx   . Drug abuse Neg Hx     Social History Social History  Substance Use Topics  . Smoking status: Never Smoker  . Smokeless tobacco: Never Used  . Alcohol use No  lives at home Lives with spouse   Allergies   Penicillins; Prednisone; and Propoxyphene n-acetaminophen   Review of Systems Review of Systems  All other systems reviewed and are negative.    Physical Exam Updated Vital Signs BP (!) 151/51   Pulse 79   Temp 98.1 F (36.7 C)   Resp 18   SpO2 100%   Vital signs normal except hypertension   Physical Exam  Constitutional: She is oriented to person, place, and time. She appears well-developed and well-nourished. No distress.  eating crackers in no distress  HENT:  Head: Normocephalic and atraumatic.  Right Ear: External ear normal.  Left Ear: External ear normal.  Nose: Nose normal.  Eyes: Conjunctivae and EOM are normal.  Neck: Normal range of motion.  Cardiovascular: Normal rate.   Pulmonary/Chest: Effort normal. No respiratory distress.  Musculoskeletal: Normal range of motion. She exhibits no  edema, tenderness or deformity.  atient's left leg is overall larger than her right, family states that is chronic. There is no deformity noted to her toes.  Neurological: She is alert and oriented to person, place, and time. No cranial nerve deficit.  Skin: Skin is warm and dry.  Patient's left great toe does not appear swollen, there is no redness, there is no lesion seen. She has 2 hyperpigmented spots on the source of her foot that the husband was concerned about.  Psychiatric: She has a normal mood and affect. Her behavior is normal. Thought content normal.  Nursing note and vitals reviewed.        ED Treatments / Results  Labs (all labs ordered are listed, but only abnormal results are displayed) Results for orders placed or performed during the hospital encounter of 05/08/17  CBG monitoring, ED  Result Value Ref Range   Glucose-Capillary 183 (H) 65 - 99 mg/dL   Laboratory interpretation all normal except hyperglycemia    EKG  EKG Interpretation None       Radiology No results found.  Procedures Procedures (including critical care time)  Medications Ordered in ED Medications - No data to display   Initial Impression / Assessment and Plan / ED Course  I have reviewed the triage vital signs and the nursing notes.  Pertinent labs & imaging results that were available during my care of the patient were reviewed by me and considered in my medical decision making (see chart for details).     Patient had treated herself at home with Benadryl and by the time she got to the ED her itching was improved. We discussed just going over what she ate today to make sure there was nothing in her diet that caused itching like she had before. She can continue taking the Benadryl as needed.  Final Clinical Impressions(s) / ED Diagnoses   Final diagnoses:  Itching    New Prescriptions OTC benadryl  Plan discharge  Rolland Porter, MD, [facility]  Rolland Porter, MD 05/08/17  [phone]

## 2017-05-08 NOTE — ED Triage Notes (Signed)
Pt c/o left great toe itching that started today.

## 2017-05-09 ENCOUNTER — Other Ambulatory Visit: Payer: Self-pay | Admitting: *Deleted

## 2017-05-09 ENCOUNTER — Ambulatory Visit (INDEPENDENT_AMBULATORY_CARE_PROVIDER_SITE_OTHER): Payer: PPO | Admitting: Family Medicine

## 2017-05-09 ENCOUNTER — Encounter: Payer: Self-pay | Admitting: Family Medicine

## 2017-05-09 VITALS — BP 138/74 | HR 75 | Resp 16 | Ht 68.0 in | Wt 288.1 lb

## 2017-05-09 DIAGNOSIS — E669 Obesity, unspecified: Secondary | ICD-10-CM

## 2017-05-09 DIAGNOSIS — Z09 Encounter for follow-up examination after completed treatment for conditions other than malignant neoplasm: Secondary | ICD-10-CM | POA: Diagnosis not present

## 2017-05-09 DIAGNOSIS — L0390 Cellulitis, unspecified: Secondary | ICD-10-CM

## 2017-05-09 DIAGNOSIS — L03032 Cellulitis of left toe: Secondary | ICD-10-CM | POA: Diagnosis not present

## 2017-05-09 DIAGNOSIS — E1169 Type 2 diabetes mellitus with other specified complication: Secondary | ICD-10-CM

## 2017-05-09 DIAGNOSIS — I10 Essential (primary) hypertension: Secondary | ICD-10-CM

## 2017-05-09 HISTORY — DX: Cellulitis, unspecified: L03.90

## 2017-05-09 MED ORDER — DOXYCYCLINE HYCLATE 100 MG PO TABS: 100.0000 mg | ORAL_TABLET | Freq: Two times a day (BID) | ORAL | 0 refills | Status: DC

## 2017-05-09 NOTE — Patient Outreach (Signed)
[facility]) Care Management  05/09/2017  SCARLET ABAD 1940/11/12 [id number]   CSW called & spoke with patient to follow-up on resources. Patient reports that she went to [facility] Emergency Department the other night as her foot was itching without relief. Patient went to her PCP - Dr. Moshe Cipro today and was prescribed Doxycycline for the cellulitis to left leg. CSW reminded patient to take full prescription of antibiotics & keep leg elevated per MD notes.   Patient reports that she has been doing well besides the cellulitis but was asking about assistance to fix her shower door. CSW directed patient to call [facility] [place] to see if they had any contacts for a handyman. CSW will check back in 3 weeks.    Raynaldo Opitz, LCSW [facility]  Clinical Social Worker cell #: [phone]

## 2017-05-09 NOTE — Patient Instructions (Addendum)
F/u as before, call if you need me sooner   You have a 1 week  Course of antibiotic prescribed , doxycycline which is at [facility]  Please do  NOT scratch your leg , this increases the chance of infection  Please keep legs elevated  You are treated for cellulitis of left leg

## 2017-05-09 NOTE — Assessment & Plan Note (Signed)
Left leg cellulitis  To mid calf, needs to elevate leg and doxycycline prescribed. Two areas of dried blood seen under 2nd toe

## 2017-05-10 ENCOUNTER — Ambulatory Visit: Payer: Self-pay | Admitting: *Deleted

## 2017-05-10 DIAGNOSIS — Z09 Encounter for follow-up examination after completed treatment for conditions other than malignant neoplasm: Secondary | ICD-10-CM | POA: Insufficient documentation

## 2017-05-10 NOTE — Assessment & Plan Note (Addendum)
Ed visit 1 day ago dx with itching of left leg, picture taken in ED note, no evidence of redness and swelling however visible at today's visit. No medication was prescribed in Ed , pt had successfully self medicated with antihistamine at the time of arrival

## 2017-05-10 NOTE — Assessment & Plan Note (Signed)
Controlled, no change in medication DASH diet and commitment to daily physical activity for a minimum of 30 minutes discussed and encouraged, as a part of hypertension management. The importance of attaining a healthy weight is also discussed.  BP/Weight 05/09/2017 05/08/2017 04/18/2017 03/29/2017 03/22/2017 03/06/2017 9/55/8316  Systolic BP 742 552 589 483 475 830 746  Diastolic BP 74 54 57 70 51 68 72  Wt. (Lbs) 288.12 - 291 288 288 293 280.04  BMI 43.81 - 44.25 43.79 43.79 44.55 42.58  Some encounter information is confidential and restricted. Go to Review Flowsheets activity to see all data.

## 2017-05-10 NOTE — Progress Notes (Signed)
   Laura Atkins     MRN: [id number]      DOB: August 07, 1941   HPI Laura Atkins is here for follow up from recent ED visit for left foot and leg pain and swelling . C/o itching, "spots on toe" and now redness. In the ED  Yesterday , no redness was noted, however at today's visit there is redness and swelling foot and lower one third of left leg Denies fever, chills or purulent drainage from leg or toe, denies any direct trauma to foot or leg, her only c/o itch ROS See HPI  . Denies sinus pressure, nasal congestion, ear pain or sore throat. Denies chest congestion, productive cough or wheezing. Denies chest pains, palpitations and leg swelling Denies abdominal pain, nausea, vomiting,diarrhea or constipation.   Denies dysuria, frequency, hesitancy or incontinence. C/o chronic  joint pain, swelling and limitation in mobility. Denies headaches, seizures, numbness, or tingling. Denies uncontrolled  depression, anxiety or insomnia.  PE  BP 138/74   Pulse 75   Resp 16   Ht 5\' 8"  (1.727 m)   Wt 288 lb 1.9 oz (130.7 kg)   SpO2 96%   BMI 43.81 kg/m   Patient alert and oriented and in no cardiopulmonary distress.  HEENT: No facial asymmetry, EOMI,   oropharynx pink and moist.  Neck supple no JVD, no mass.  Chest: Clear to auscultation bilaterally.  CVS: S1, S2 no murmurs, no S3.Regular rate.  ABD: Soft non tender.   Ext: One plus pitting  Edema of LLE  MS: Decreased ROM spine, shoulders, hips and knees.  Skin: Erythema and warmth of left foot and left distal third of LLE withtenderness Psych: Good eye contact, normal affect. Memory intact not anxious or depressed appearing.  CNS: CN 2-12 intact, power,  normal throughout.no focal deficits noted.   Assessment & Plan  Cellulitis Left leg cellulitis  To mid calf, needs to elevate leg and doxycycline prescribed. Two areas of dried blood seen under 2nd toe  Encounter for examination following treatment at hospital Ed visit 1 day  ago dx with itching of left leg, picture taken in ED note, no evidence of redness and swelling however visible at today's visit. No medication was prescribed in Ed , pt had successfully self medicated with antihistamine at the time of arrival  Essential hypertension Controlled, no change in medication DASH diet and commitment to daily physical activity for a minimum of 30 minutes discussed and encouraged, as a part of hypertension management. The importance of attaining a healthy weight is also discussed.  BP/Weight 05/09/2017 05/08/2017 04/18/2017 03/29/2017 03/22/2017 03/06/2017 5/32/9924  Systolic BP 268 341 962 229 798 921 194  Diastolic BP 74 54 57 70 51 68 72  Wt. (Lbs) 288.12 - 291 288 288 293 280.04  BMI 43.81 - 44.25 43.79 43.79 44.55 42.58  Some encounter information is confidential and restricted. Go to Review Flowsheets activity to see all data.       Diabetes mellitus type 2 in obese (HCC) Controlled, no change in medication Managed by endo

## 2017-05-10 NOTE — Assessment & Plan Note (Signed)
Controlled, no change in medication Managed by endo 

## 2017-05-14 ENCOUNTER — Encounter (HOSPITAL_COMMUNITY): Payer: Self-pay | Admitting: Psychiatry

## 2017-05-14 ENCOUNTER — Ambulatory Visit (INDEPENDENT_AMBULATORY_CARE_PROVIDER_SITE_OTHER): Payer: PPO | Admitting: Psychiatry

## 2017-05-14 ENCOUNTER — Telehealth: Payer: Self-pay | Admitting: Family Medicine

## 2017-05-14 VITALS — BP 170/66 | HR 86 | Ht 68.0 in | Wt 279.0 lb

## 2017-05-14 DIAGNOSIS — Z91410 Personal history of adult physical and sexual abuse: Secondary | ICD-10-CM

## 2017-05-14 DIAGNOSIS — I10 Essential (primary) hypertension: Secondary | ICD-10-CM | POA: Diagnosis not present

## 2017-05-14 DIAGNOSIS — Z91411 Personal history of adult psychological abuse: Secondary | ICD-10-CM | POA: Diagnosis not present

## 2017-05-14 DIAGNOSIS — Z818 Family history of other mental and behavioral disorders: Secondary | ICD-10-CM | POA: Diagnosis not present

## 2017-05-14 DIAGNOSIS — E559 Vitamin D deficiency, unspecified: Secondary | ICD-10-CM | POA: Diagnosis not present

## 2017-05-14 DIAGNOSIS — F329 Major depressive disorder, single episode, unspecified: Secondary | ICD-10-CM

## 2017-05-14 DIAGNOSIS — E782 Mixed hyperlipidemia: Secondary | ICD-10-CM | POA: Diagnosis not present

## 2017-05-14 DIAGNOSIS — F32A Depression, unspecified: Secondary | ICD-10-CM

## 2017-05-14 LAB — COMPREHENSIVE METABOLIC PANEL
ALT: 18 U/L (ref 6–29)
AST: 19 U/L (ref 10–35)
Albumin: 4.3 g/dL (ref 3.6–5.1)
Alkaline Phosphatase: 109 U/L (ref 33–130)
BUN: 21 mg/dL (ref 7–25)
CO2: 29 mmol/L (ref 20–32)
Calcium: 9.6 mg/dL (ref 8.6–10.4)
Chloride: 104 mmol/L (ref 98–110)
Creat: 0.97 mg/dL — ABNORMAL HIGH (ref 0.60–0.93)
Glucose, Bld: 35 mg/dL — CL (ref 65–99)
Potassium: 4.2 mmol/L (ref 3.5–5.3)
Sodium: 141 mmol/L (ref 135–146)
Total Bilirubin: 0.3 mg/dL (ref 0.2–1.2)
Total Protein: 7.3 g/dL (ref 6.1–8.1)

## 2017-05-14 LAB — LIPID PANEL
Cholesterol: 154 mg/dL (ref <200)
HDL: 76 mg/dL (ref >50)
LDL Cholesterol: 62 mg/dL (ref <100)
Total CHOL/HDL Ratio: 2 Ratio (ref <5.0)
Triglycerides: 79 mg/dL (ref <150)
VLDL: 16 mg/dL (ref <30)

## 2017-05-14 LAB — TSH: TSH: 2.01 mIU/L

## 2017-05-14 MED ORDER — SERTRALINE HCL 100 MG PO TABS: 100.0000 mg | ORAL_TABLET | Freq: Two times a day (BID) | ORAL | 5 refills | Status: DC

## 2017-05-14 NOTE — Telephone Encounter (Signed)
Called pt she is fine, advised her to have orange juice when she is having fasting labs so blood sugar not as low as 35
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
## 2017-05-15 LAB — VITAMIN D 25 HYDROXY (VIT D DEFICIENCY, FRACTURES): Vit D, 25-Hydroxy: 12 ng/mL — ABNORMAL LOW (ref 30–100)

## 2017-05-22 ENCOUNTER — Ambulatory Visit (INDEPENDENT_AMBULATORY_CARE_PROVIDER_SITE_OTHER): Payer: PPO | Admitting: Family Medicine

## 2017-05-22 ENCOUNTER — Encounter: Payer: Self-pay | Admitting: Family Medicine

## 2017-05-22 VITALS — BP 148/64 | HR 83 | Resp 16 | Ht 68.0 in | Wt 291.0 lb

## 2017-05-22 DIAGNOSIS — E669 Obesity, unspecified: Secondary | ICD-10-CM

## 2017-05-22 DIAGNOSIS — E784 Other hyperlipidemia: Secondary | ICD-10-CM

## 2017-05-22 DIAGNOSIS — I10 Essential (primary) hypertension: Secondary | ICD-10-CM

## 2017-05-22 DIAGNOSIS — F32A Depression, unspecified: Secondary | ICD-10-CM

## 2017-05-22 DIAGNOSIS — Z23 Encounter for immunization: Secondary | ICD-10-CM | POA: Diagnosis not present

## 2017-05-22 DIAGNOSIS — F419 Anxiety disorder, unspecified: Secondary | ICD-10-CM | POA: Diagnosis not present

## 2017-05-22 DIAGNOSIS — E1169 Type 2 diabetes mellitus with other specified complication: Secondary | ICD-10-CM

## 2017-05-22 DIAGNOSIS — N3945 Continuous leakage: Secondary | ICD-10-CM

## 2017-05-22 DIAGNOSIS — E7849 Other hyperlipidemia: Secondary | ICD-10-CM

## 2017-05-22 DIAGNOSIS — F329 Major depressive disorder, single episode, unspecified: Secondary | ICD-10-CM

## 2017-05-22 DIAGNOSIS — Z7409 Other reduced mobility: Secondary | ICD-10-CM | POA: Diagnosis not present

## 2017-05-22 NOTE — Patient Instructions (Signed)
Annual exam January 23 or after call if you need me before  Flu vaccine today    Excellent labs  No changes in medication  We will contact  t case worker to see if you can get some help for incontinence supplies   Thank you  for choosing [facility]. We consider it a privelige to serve you.  Delivering excellent health care in a caring and  compassionate way is our goal.  Partnering with you,  so that together we can achieve this goal is our strategy.

## 2017-05-26 NOTE — Assessment & Plan Note (Signed)
Severe , pt  Requesting help with incontinence supplies, will reach out to social worker

## 2017-05-26 NOTE — Assessment & Plan Note (Signed)
Controlled, no change in medication Hyperlipidemia:Low fat diet discussed and encouraged.   Lipid Panel  Lab Results  Component Value Date   CHOL 154 05/14/2017   HDL 76 05/14/2017   LDLCALC 62 05/14/2017   TRIG 79 05/14/2017   CHOLHDL 2.0 05/14/2017

## 2017-05-26 NOTE — Assessment & Plan Note (Signed)
Home safety reviewed and fall risk reduction ?

## 2017-05-26 NOTE — Assessment & Plan Note (Signed)
resolved 

## 2017-05-26 NOTE — Assessment & Plan Note (Signed)
Laura Atkins is reminded of the importance of commitment to daily physical activity for 30 minutes or more, as able and the need to limit carbohydrate intake to 30 to 60 grams per meal to help with blood sugar control.   The need to take medication as prescribed, test blood sugar as directed, and to call between visits if there is a concern that blood sugar is uncontrolled is also discussed.   Laura Atkins is reminded of the importance of daily foot exam, annual eye examination, and good blood sugar, blood pressure and cholesterol control. Managed by endo, controlled, but needs to improved, pt needs to follow diet Diabetic Labs Latest Ref Rng & Units 05/14/2017 04/18/2017 03/22/2017 01/17/2017 12/17/2016  HbA1c - - 7.9 - - 7.3  Microalbumin Not Estab. ug/mL - - - - -  Micro/Creat Ratio 0.0 - 30.0 mg/g creat - - - - -  Chol <200 mg/dL 154 - - - -  HDL >50 mg/dL 76 - - - -  Calc LDL <100 mg/dL 62 - - - -  Triglycerides <150 mg/dL 79 - - - -  Creatinine 0.60 - 0.93 mg/dL 0.97(H) - 1.05(H) 1.01(H) -   BP/Weight 05/22/2017 05/09/2017 05/08/2017 04/18/2017 03/29/2017 03/22/2017 3/50/0938  Systolic BP 182 993 716 967 893 810 175  Diastolic BP 64 74 54 57 70 51 68  Wt. (Lbs) 291 288.12 - 291 288 288 293  BMI 44.25 43.81 - 44.25 43.79 43.79 44.55  Some encounter information is confidential and restricted. Go to Review Flowsheets activity to see all data.   Foot/eye exam completion dates Latest Ref Rng & Units 04/18/2017 12/17/2016  Eye Exam No Retinopathy - -  Foot Form Completion - Done Done

## 2017-05-26 NOTE — Assessment & Plan Note (Signed)
Rechecked BP 136/80 DASH diet and commitment to daily physical activity for a minimum of 30 minutes discussed and encouraged, as a part of hypertension management. The importance of attaining a healthy weight is also discussed.  BP/Weight 05/22/2017 05/09/2017 05/08/2017 04/18/2017 03/29/2017 03/22/2017 5/84/4171  Systolic BP 278 718 367 255 001 642 903  Diastolic BP 64 74 54 57 70 51 68  Wt. (Lbs) 291 288.12 - 291 288 288 293  BMI 44.25 43.81 - 44.25 43.79 43.79 44.55  Some encounter information is confidential and restricted. Go to Review Flowsheets activity to see all data.   No med chnage

## 2017-05-26 NOTE — Progress Notes (Signed)
Laura Atkins     MRN: [id number]      DOB: 03-05-1941   HPI Laura Atkins is here for follow up and re-evaluation of chronic medical conditions, medication management and review of any available recent lab and radiology data.  Preventive health is updated, specifically  Cancer screening and Immunization.   Questions or concerns regarding consultations or procedures which the PT has had in the interim are  addressed. The PT denies any adverse reactions to current medications since the last visit.  There are no new concerns.  There are no specific complaints   ROS Denies recent fever or chills. Denies sinus pressure, nasal congestion, ear pain or sore throat. Denies chest congestion, productive cough or wheezing. Denies chest pains, palpitations and leg swelling Denies abdominal pain, nausea, vomiting,diarrhea or constipation.   Denies dysuria, frequency, hesitancy or incontinence. Denies uncontrolled  joint pain, swelling and limitation in mobility. Denies headaches, seizures, numbness, or tingling. Denies uncontrolled  depression, anxiety or insomnia. Denies skin break down or rash.   PE  BP (!) 148/64   Pulse 83   Resp 16   Ht 5\' 8"  (1.727 m)   Wt 291 lb (132 kg)   SpO2 98%   BMI 44.25 kg/m   Patient alert and oriented and in no cardiopulmonary distress.  HEENT: No facial asymmetry, EOMI,   oropharynx pink and moist.  Neck supple no JVD, no mass.  Chest: Clear to auscultation bilaterally.  CVS: S1, S2 no murmurs, no S3.Regular rate.  ABD: Soft non tender.   Ext: No edema  MS: Decreased  ROM spine, shoulders, hips and knees.  Skin: Intact, no ulcerations or rash noted.  Psych: Good eye contact, normal affect. Memory intact not anxious or depressed appearing.  CNS: CN 2-12 intact, power,  normal throughout.no focal deficits noted.   Assessment & Plan  Essential hypertension Rechecked BP 136/80 DASH diet and commitment to daily physical activity for a  minimum of 30 minutes discussed and encouraged, as a part of hypertension management. The importance of attaining a healthy weight is also discussed.  BP/Weight 05/22/2017 05/09/2017 05/08/2017 04/18/2017 03/29/2017 03/22/2017 7/84/6962  Systolic BP 952 841 324 401 027 253 664  Diastolic BP 64 74 54 57 70 51 68  Wt. (Lbs) 291 288.12 - 291 288 288 293  BMI 44.25 43.81 - 44.25 43.79 43.79 44.55  Some encounter information is confidential and restricted. Go to Review Flowsheets activity to see all data.   No med chnage    Diabetes mellitus type 2 in obese [facility]) Laura Atkins is reminded of the importance of commitment to daily physical activity for 30 minutes or more, as able and the need to limit carbohydrate intake to 30 to 60 grams per meal to help with blood sugar control.   The need to take medication as prescribed, test blood sugar as directed, and to call between visits if there is a concern that blood sugar is uncontrolled is also discussed.   Laura Atkins is reminded of the importance of daily foot exam, annual eye examination, and good blood sugar, blood pressure and cholesterol control. Managed by endo, controlled, but needs to improved, pt needs to follow diet Diabetic Labs Latest Ref Rng & Units 05/14/2017 04/18/2017 03/22/2017 01/17/2017 12/17/2016  HbA1c - - 7.9 - - 7.3  Microalbumin Not Estab. ug/mL - - - - -  Micro/Creat Ratio 0.0 - 30.0 mg/g creat - - - - -  Chol <200 mg/dL 154 - - - -  HDL >50 mg/dL 76 - - - -  Calc LDL <100 mg/dL 62 - - - -  Triglycerides <150 mg/dL 79 - - - -  Creatinine 0.60 - 0.93 mg/dL 0.97(H) - 1.05(H) 1.01(H) -   BP/Weight 05/22/2017 05/09/2017 05/08/2017 04/18/2017 03/29/2017 03/22/2017 6/60/6004  Systolic BP 599 774 142 395 320 233 435  Diastolic BP 64 74 54 57 70 51 68  Wt. (Lbs) 291 288.12 - 291 288 288 293  BMI 44.25 43.81 - 44.25 43.79 43.79 44.55  Some encounter information is confidential and restricted. Go to Review Flowsheets activity to see all data.    Foot/eye exam completion dates Latest Ref Rng & Units 04/18/2017 12/17/2016  Eye Exam No Retinopathy - -  Foot Form Completion - Done Done        Cellulitis resolved  Hyperlipemia Controlled, no change in medication Hyperlipidemia:Low fat diet discussed and encouraged.   Lipid Panel  Lab Results  Component Value Date   CHOL 154 05/14/2017   HDL 76 05/14/2017   LDLCALC 62 05/14/2017   TRIG 79 05/14/2017   CHOLHDL 2.0 05/14/2017       URINARY INCONTINENCE Severe , pt  Requesting help with incontinence supplies, will reach out to social worker  Anxiety and depression Controlled, no change in medication Followed and treated by psychiatry  Limited mobility Home safety reviewed and fall risk reduction.

## 2017-05-26 NOTE — Assessment & Plan Note (Signed)
Controlled, no change in medication Followed and treated by psychiatry

## 2017-05-28 ENCOUNTER — Telehealth: Payer: Self-pay | Admitting: Family Medicine

## 2017-05-28 DIAGNOSIS — N3945 Continuous leakage: Secondary | ICD-10-CM

## 2017-05-28 NOTE — Telephone Encounter (Signed)
-----   Message from Fayrene Helper, MD sent at 05/26/2017  9:43 PM EDT ----- Regarding: help please pls refer pt to [facility] for assistance with incontinence supplies  ??

## 2017-05-30 ENCOUNTER — Other Ambulatory Visit: Payer: Self-pay | Admitting: *Deleted

## 2017-05-30 NOTE — Patient Outreach (Signed)
[facility]) Care Management  05/30/2017  SHAWNESE MAGNER 06/20/1941 [id number]   CSW called & spoke with patient, patient reports that she has been doing well - states that her daughter, [name] is scheduled to have surgery in the next few weeks and plans to have their advance directives done at that time while she is in the hospital. Patient reports no further CSW needs indicated at this time.   CSW will perform a case closure on patient, as all goals of treatment have been met from social work standpoint and no additional social work needs have been identified at this time.   CSW will fax an update to patient's Primary Care Physician, Dr. Tula Nakayama to ensure that they are aware of CSW's involvement with patient's plan of care. CSW will submit a case closure request to Alycia Rossetti, Care Management Assistant with [facility] in the form of an inbasket message.    Raynaldo Opitz, LCSW [facility]  Clinical Social Worker cell #: [phone]

## 2017-07-01 ENCOUNTER — Other Ambulatory Visit: Payer: Self-pay | Admitting: Cardiology

## 2017-07-16 ENCOUNTER — Telehealth: Payer: Self-pay

## 2017-07-16 NOTE — Telephone Encounter (Signed)
Telephone outreach to patient was unsuccessful. Unable to leave vm for return call because vm is not set up. Will try additional call to give patient community resource information.    Josepha Pigg, B.A.  Care Guide [phone]

## 2017-07-22 ENCOUNTER — Telehealth: Payer: Self-pay

## 2017-07-22 NOTE — Telephone Encounter (Signed)
Telephone outreach to patient to discuss community resources. Provided some resource information for incontinence supplies. Patient will follow up to inquire about supply delivery.    Laura Atkins, B.A.  Care Guide  [phone]

## 2017-07-29 ENCOUNTER — Ambulatory Visit: Payer: PPO | Admitting: Family Medicine

## 2017-07-30 ENCOUNTER — Telehealth: Payer: Self-pay | Admitting: *Deleted

## 2017-07-30 NOTE — Telephone Encounter (Signed)
Patient called left voicemail for Dr Moshe Cipro to return her call. Patient stated her kidney's feel like they are "unsection" and she feels like she needs to throw up. Please advise [phone].

## 2017-07-31 ENCOUNTER — Ambulatory Visit: Payer: PPO | Admitting: Family Medicine

## 2017-07-31 NOTE — Telephone Encounter (Signed)
Missed appt yesterday, another scheduled for today

## 2017-07-31 NOTE — Telephone Encounter (Signed)
I seen that patient is scheduled for today at 4:00.

## 2017-08-06 ENCOUNTER — Encounter: Payer: Self-pay | Admitting: Family Medicine

## 2017-08-06 ENCOUNTER — Ambulatory Visit (INDEPENDENT_AMBULATORY_CARE_PROVIDER_SITE_OTHER): Payer: PPO | Admitting: Family Medicine

## 2017-08-06 VITALS — BP 130/80 | HR 82 | Resp 16 | Ht 68.0 in | Wt 290.0 lb

## 2017-08-06 DIAGNOSIS — E669 Obesity, unspecified: Secondary | ICD-10-CM

## 2017-08-06 DIAGNOSIS — I10 Essential (primary) hypertension: Secondary | ICD-10-CM | POA: Diagnosis not present

## 2017-08-06 DIAGNOSIS — Z7409 Other reduced mobility: Secondary | ICD-10-CM

## 2017-08-06 DIAGNOSIS — G8929 Other chronic pain: Secondary | ICD-10-CM

## 2017-08-06 DIAGNOSIS — M1712 Unilateral primary osteoarthritis, left knee: Secondary | ICD-10-CM

## 2017-08-06 DIAGNOSIS — E1169 Type 2 diabetes mellitus with other specified complication: Secondary | ICD-10-CM | POA: Diagnosis not present

## 2017-08-06 DIAGNOSIS — H902 Conductive hearing loss, unspecified: Secondary | ICD-10-CM | POA: Diagnosis not present

## 2017-08-06 DIAGNOSIS — M25511 Pain in right shoulder: Secondary | ICD-10-CM | POA: Diagnosis not present

## 2017-08-06 DIAGNOSIS — M4712 Other spondylosis with myelopathy, cervical region: Secondary | ICD-10-CM | POA: Diagnosis not present

## 2017-08-06 NOTE — Patient Instructions (Addendum)
F/u in 4 month, call if you need me sooner  You are referred for in home physical therapy twice weekly for 6 weeks  You are referred for evaluation for hearing loss  You are referrded to Dr Gershon Crane foe eye exam  Fasting lipid, cmp and EGFr in 4,5 months, 1 week before  . Fall Prevention in the Home Falls can cause injuries. They can happen to people of all ages. There are many things you can do to make your home safe and to help prevent falls. What can I do on the outside of my home?  Regularly fix the edges of walkways and driveways and fix any cracks.  Remove anything that might make you trip as you walk through a door, such as a raised step or threshold.  Trim any bushes or trees on the path to your home.  Use bright outdoor lighting.  Clear any walking paths of anything that might make someone trip, such as rocks or tools.  Regularly check to see if handrails are loose or broken. Make sure that both sides of any steps have handrails.  Any raised decks and porches should have guardrails on the edges.  Have any leaves, snow, or ice cleared regularly.  Use sand or salt on walking paths during winter.  Clean up any spills in your garage right away. This includes oil or grease spills. What can I do in the bathroom?  Use night lights.  Install grab bars by the toilet and in the tub and shower. Do not use towel bars as grab bars.  Use non-skid mats or decals in the tub or shower.  If you need to sit down in the shower, use a plastic, non-slip stool.  Keep the floor dry. Clean up any water that spills on the floor as soon as it happens.  Remove soap buildup in the tub or shower regularly.  Attach bath mats securely with double-sided non-slip rug tape.  Do not have throw rugs and other things on the floor that can make you trip. What can I do in the bedroom?  Use night lights.  Make sure that you have a light by your bed that is easy to reach.  Do not use any  sheets or blankets that are too big for your bed. They should not hang down onto the floor.  Have a firm chair that has side arms. You can use this for support while you get dressed.  Do not have throw rugs and other things on the floor that can make you trip. What can I do in the kitchen?  Clean up any spills right away.  Avoid walking on wet floors.  Keep items that you use a lot in easy-to-reach places.  If you need to reach something above you, use a strong step stool that has a grab bar.  Keep electrical cords out of the way.  Do not use floor polish or wax that makes floors slippery. If you must use wax, use non-skid floor wax.  Do not have throw rugs and other things on the floor that can make you trip. What can I do with my stairs?  Do not leave any items on the stairs.  Make sure that there are handrails on both sides of the stairs and use them. Fix handrails that are broken or loose. Make sure that handrails are as long as the stairways.  Check any carpeting to make sure that it is firmly attached to the stairs. Fix any  carpet that is loose or worn.  Avoid having throw rugs at the top or bottom of the stairs. If you do have throw rugs, attach them to the floor with carpet tape.  Make sure that you have a light switch at the top of the stairs and the bottom of the stairs. If you do not have them, ask someone to add them for you. What else can I do to help prevent falls?  Wear shoes that: ? Do not have high heels. ? Have rubber bottoms. ? Are comfortable and fit you well. ? Are closed at the toe. Do not wear sandals.  If you use a stepladder: ? Make sure that it is fully opened. Do not climb a closed stepladder. ? Make sure that both sides of the stepladder are locked into place. ? Ask someone to hold it for you, if possible.  Clearly mark and make sure that you can see: ? Any grab bars or handrails. ? First and last steps. ? Where the edge of each step  is.  Use tools that help you move around (mobility aids) if they are needed. These include: ? Canes. ? Walkers. ? Scooters. ? Crutches.  Turn on the lights when you go into a dark area. Replace any light bulbs as soon as they burn out.  Set up your furniture so you have a clear path. Avoid moving your furniture around.  If any of your floors are uneven, fix them.  If there are any pets around you, be aware of where they are.  Review your medicines with your doctor. Some medicines can make you feel dizzy. This can increase your chance of falling. Ask your doctor what other things that you can do to help prevent falls. This information is not intended to replace advice given to you by your health care provider. Make sure you discuss any questions you have with your health care provider. Document Released: 07/07/2009 Document Revised: 02/16/2016 Document Reviewed: 10/15/2014 Elsevier Interactive Patient Education  [employer].

## 2017-08-07 ENCOUNTER — Encounter: Payer: Self-pay | Admitting: Family Medicine

## 2017-08-07 NOTE — Progress Notes (Signed)
DEA BITTING     MRN: [id number]      DOB: October 23, 1940   HPI Laura Atkins is here for follow up and re-evaluation of chronic medical conditions, medication management and review of any available recent lab and radiology data.  Preventive health is updated, specifically  Cancer screening and Immunization.   Questions or concerns regarding consultations or procedures which the PT has had in the interim are  addressed. The PT denies any adverse reactions to current medications since the last visit.  C/o poor mobility in the home and increased need for assistance with raising up out of chair and moving around, interested in and needs physical therapy to improve independence, mobility and safe ambulation in her home, family desires this also, especially as she has established severe osteoarthritis of the left knee and surgery is not a desirable option. Leaving her home independently is no longer an option and due to her co morbidities C/o worsening hearing loss Denies polyuria, polydipsia, blurred vision , or hypoglycemic episodes.  ROS Denies recent fever or chills. Denies sinus pressure, nasal congestion, ear pain or sore throat. Denies chest congestion, productive cough or wheezing. Denies chest pains, palpitations and leg swelling Denies abdominal pain, nausea, vomiting,diarrhea or constipation.   Denies dysuria, frequency, hesitancy or incontinence.  Denies headaches, seizures, numbness, or tingling. Denies uncontrolled  depression, anxiety or insomnia. Denies skin break down or rash.   PE  BP 130/80   Pulse 82   Resp 16   Ht 5\' 8"  (1.727 m)   Wt 290 lb (131.5 kg)   SpO2 98%   BMI 44.09 kg/m   Patient alert and oriented and in no cardiopulmonary distress.  HEENT: No facial asymmetry, EOMI,   oropharynx pink and moist.  Neck supple no JVD, no mass.TM clear, no cerumen on exam  Chest: Clear to auscultation bilaterally.  CVS: S1, S2 no murmurs, no S3.Regular rate.  ABD:  Soft non tender.   Ext: No edema  MS: Decreased  ROM spine, shoulders, hips and knees.  Skin: Intact, no ulcerations or rash noted.  Psych: Good eye contact, normal affect. Memory intact not anxious or depressed appearing.  CNS: CN 2-12 intact, power,  normal throughout.no focal deficits noted.   Assessment & Plan  Osteoarthritis of left knee Severe/  osteoarthritis limiting safe mobility in the home, increased need for assistance from family members in changing position, from sitting to standing , also increased pain , not an optimal  surgical candidate  Will benefit from in home PT twice weekly for 6 weeks, limitation in leaving home incapable of doing this on her own and her co morbidities place her at risk of c/p decompensation   Limited mobility Patient and family note increased need for assistance with changing position and movement in the home, needs physical therapy Fall precautions and home safety reviewed briefly also  Spondylosis, cervical, with myelopathy Progressive reduction in independent mobility noted needs physical therapy to maximize function and safety, will refer for 6 week program in home  Shoulder pain, right Occupational therpay in home needed also as pt has chronic arthritis of shoulder and relies on upper extremity strength to safely use her walker  Hearing loss Reports deterioration in hearing, turning up TV even more loudly , refer to ENT  Essential hypertension Controlled, no change in medication DASH diet and commitment to daily physical activity for a minimum of 30 minutes discussed and encouraged, as a part of hypertension management. The importance of attaining  a healthy weight is also discussed.  BP/Weight 08/06/2017 05/22/2017 05/09/2017 05/08/2017 04/18/2017 03/29/2017 06/10/9149  Systolic BP 569 794 801 655 374 827 078  Diastolic BP 80 64 74 54 57 70 51  Wt. (Lbs) 290 291 288.12 - 291 288 288  BMI 44.09 44.25 43.81 - 44.25 43.79 43.79  Some  encounter information is confidential and restricted. Go to Review Flowsheets activity to see all data.       Diabetes mellitus type 2 in obese [facility]) Managed by endo and controlled [name] is reminded of the importance of commitment to daily physical activity for 30 minutes or more, as able and the need to limit carbohydrate intake to 30 to 60 grams per meal to help with blood sugar control.   The need to take medication as prescribed, test blood sugar as directed, and to call between visits if there is a concern that blood sugar is uncontrolled is also discussed.   [name] is reminded of the importance of daily foot exam, annual eye examination, and good blood sugar, blood pressure and cholesterol control.  Diabetic Labs Latest Ref Rng & Units 05/14/2017 04/18/2017 03/22/2017 01/17/2017 12/17/2016  HbA1c - - 7.9 - - 7.3  Microalbumin Not Estab. ug/mL - - - - -  Micro/Creat Ratio 0.0 - 30.0 mg/g creat - - - - -  Chol <200 mg/dL 154 - - - -  HDL >50 mg/dL 76 - - - -  Calc LDL <100 mg/dL 62 - - - -  Triglycerides <150 mg/dL 79 - - - -  Creatinine 0.60 - 0.93 mg/dL 0.97(H) - 1.05(H) 1.01(H) -   BP/Weight 08/06/2017 05/22/2017 05/09/2017 05/08/2017 04/18/2017 03/29/2017 6/75/4492  Systolic BP 010 071 219 758 832 549 826  Diastolic BP 80 64 74 54 57 70 51  Wt. (Lbs) 290 291 288.12 - 291 288 288  BMI 44.09 44.25 43.81 - 44.25 43.79 43.79  Some encounter information is confidential and restricted. Go to Review Flowsheets activity to see all data.   Foot/eye exam completion dates Latest Ref Rng & Units 04/18/2017 12/17/2016  Eye Exam No Retinopathy - -  Foot Form Completion - Done Done

## 2017-08-07 NOTE — Assessment & Plan Note (Signed)
Reports deterioration in hearing, turning up TV even more loudly , refer to ENT

## 2017-08-07 NOTE — Assessment & Plan Note (Signed)
Progressive reduction in independent mobility noted needs physical therapy to maximize function and safety, will refer for 6 week program in home

## 2017-08-07 NOTE — Assessment & Plan Note (Signed)
Patient and family note increased need for assistance with changing position and movement in the home, needs physical therapy Fall precautions and home safety reviewed briefly also

## 2017-08-07 NOTE — Assessment & Plan Note (Signed)
Managed by endo and controlled Laura Atkins is reminded of the importance of commitment to daily physical activity for 30 minutes or more, as able and the need to limit carbohydrate intake to 30 to 60 grams per meal to help with blood sugar control.   The need to take medication as prescribed, test blood sugar as directed, and to call between visits if there is a concern that blood sugar is uncontrolled is also discussed.   Laura Atkins is reminded of the importance of daily foot exam, annual eye examination, and good blood sugar, blood pressure and cholesterol control.  Diabetic Labs Latest Ref Rng & Units 05/14/2017 04/18/2017 03/22/2017 01/17/2017 12/17/2016  HbA1c - - 7.9 - - 7.3  Microalbumin Not Estab. ug/mL - - - - -  Micro/Creat Ratio 0.0 - 30.0 mg/g creat - - - - -  Chol <200 mg/dL 154 - - - -  HDL >50 mg/dL 76 - - - -  Calc LDL <100 mg/dL 62 - - - -  Triglycerides <150 mg/dL 79 - - - -  Creatinine 0.60 - 0.93 mg/dL 0.97(H) - 1.05(H) 1.01(H) -   BP/Weight 08/06/2017 05/22/2017 05/09/2017 05/08/2017 04/18/2017 03/29/2017 5/68/1275  Systolic BP 170 017 494 496 759 163 846  Diastolic BP 80 64 74 54 57 70 51  Wt. (Lbs) 290 291 288.12 - 291 288 288  BMI 44.09 44.25 43.81 - 44.25 43.79 43.79  Some encounter information is confidential and restricted. Go to Review Flowsheets activity to see all data.   Foot/eye exam completion dates Latest Ref Rng & Units 04/18/2017 12/17/2016  Eye Exam No Retinopathy - -  Foot Form Completion - Done Done

## 2017-08-07 NOTE — Assessment & Plan Note (Signed)
Occupational therpay in home needed also as pt has chronic arthritis of shoulder and relies on upper extremity strength to safely use her walker

## 2017-08-07 NOTE — Assessment & Plan Note (Signed)
Severe/  osteoarthritis limiting safe mobility in the home, increased need for assistance from family members in changing position, from sitting to standing , also increased pain , not an optimal  surgical candidate  Will benefit from in home PT twice weekly for 6 weeks, limitation in leaving home incapable of doing this on her own and her co morbidities place her at risk of c/p decompensation

## 2017-08-07 NOTE — Assessment & Plan Note (Signed)
Controlled, no change in medication DASH diet and commitment to daily physical activity for a minimum of 30 minutes discussed and encouraged, as a part of hypertension management. The importance of attaining a healthy weight is also discussed.  BP/Weight 08/06/2017 05/22/2017 05/09/2017 05/08/2017 04/18/2017 03/29/2017 11/28/4598  Systolic BP 298 473 085 694 370 052 591  Diastolic BP 80 64 74 54 57 70 51  Wt. (Lbs) 290 291 288.12 - 291 288 288  BMI 44.09 44.25 43.81 - 44.25 43.79 43.79  Some encounter information is confidential and restricted. Go to Review Flowsheets activity to see all data.

## 2017-08-09 ENCOUNTER — Telehealth: Payer: Self-pay | Admitting: Family Medicine

## 2017-08-09 DIAGNOSIS — R609 Edema, unspecified: Secondary | ICD-10-CM | POA: Diagnosis not present

## 2017-08-09 DIAGNOSIS — E1142 Type 2 diabetes mellitus with diabetic polyneuropathy: Secondary | ICD-10-CM | POA: Diagnosis not present

## 2017-08-09 DIAGNOSIS — M79673 Pain in unspecified foot: Secondary | ICD-10-CM | POA: Diagnosis not present

## 2017-08-09 NOTE — Telephone Encounter (Signed)
Patients daughter Lyndee Leo **NOT ON HIPAA** Is requesting to speak to Dr.Simpson about patients physical therapy. She is requesting for patient to have physical therapy outside of the home because patient refuses to be active. She states patient sits on the side of the bed and doesn't move all day only to use the bathroom. She wants patient to be more active in order to better  Her health. Cb#: [phone]  She is hoping to get patient physical therapy at [facility] in [place] because it is close to home. Prefers 10am-11am

## 2017-08-12 ENCOUNTER — Ambulatory Visit: Payer: PPO | Admitting: Endocrinology

## 2017-08-12 ENCOUNTER — Encounter: Payer: Self-pay | Admitting: Endocrinology

## 2017-08-12 ENCOUNTER — Telehealth: Payer: Self-pay | Admitting: *Deleted

## 2017-08-12 VITALS — BP 180/62 | HR 85 | Wt 287.6 lb

## 2017-08-12 DIAGNOSIS — E1169 Type 2 diabetes mellitus with other specified complication: Secondary | ICD-10-CM | POA: Diagnosis not present

## 2017-08-12 DIAGNOSIS — E669 Obesity, unspecified: Secondary | ICD-10-CM | POA: Diagnosis not present

## 2017-08-12 DIAGNOSIS — N390 Urinary tract infection, site not specified: Secondary | ICD-10-CM

## 2017-08-12 LAB — URINALYSIS, ROUTINE W REFLEX MICROSCOPIC
Bilirubin Urine: NEGATIVE
Ketones, ur: NEGATIVE
Nitrite: POSITIVE — AB
Specific Gravity, Urine: 1.01 (ref 1.000–1.030)
Total Protein, Urine: NEGATIVE
Urine Glucose: 500 — AB
Urobilinogen, UA: 0.2 (ref 0.0–1.0)
pH: 6 (ref 5.0–8.0)

## 2017-08-12 LAB — POCT GLYCOSYLATED HEMOGLOBIN (HGB A1C): Hemoglobin A1C: 8.2

## 2017-08-12 MED ORDER — INSULIN NPH ISOPHANE & REGULAR (70-30) 100 UNIT/ML ~~LOC~~ SUSP: SUBCUTANEOUS | 11 refills | Status: DC

## 2017-08-12 NOTE — Telephone Encounter (Signed)
[name] from [facility] called requesting the last office notes to be faxed to [phone]

## 2017-08-12 NOTE — Telephone Encounter (Signed)
pls contact the caller and explain I am unable to discuss her mother with ehr as she is not named on hippa form

## 2017-08-12 NOTE — Progress Notes (Signed)
   Subjective:    Patient ID: Laura Atkins, female    DOB: [birth year], 76 y.o.   MRN: [id number]  HPI    Review of Systems     Objective:   Physical Exam        Assessment & Plan:

## 2017-08-12 NOTE — Telephone Encounter (Signed)
Ov note sent

## 2017-08-12 NOTE — Progress Notes (Signed)
Subjective:    Patient ID: Laura Atkins, female    DOB: December 26, 1940, 76 y.o.   MRN: [id number]  HPI Pt returns for f/u of diabetes mellitus: DM type: insulin-requiring type 2.   Dx'ed: 7341 Complications: polyneuropathy, PAD, and foot ulcer.   Therapy: insulin since 2005.   GDM: never.  DKA: never Severe hypoglycemia: never.  Pancreatitis: never.   Other info: in early 2016, she changed to BID premixed insulin, due to poor results with multiple daily injections.  She takes human insulin, due to cost.   Interval history: She seldom has hypoglycemia, and these episodes are mild.  no cbg record, but states cbg's are in general higher as the day goes on.  She takes 50 units with breakfast and 20 units with supper. She never misses the insulin.   Past Medical History:  Diagnosis Date  . Alpha thalassemia trait 01/26/2010   02/2012: Nl CBC ex H&H-10.7/34.8, MCV-69   . Anemia   . Anxiety   . Anxiety and depression   . Depression   . Diabetes mellitus   . GERD (gastroesophageal reflux disease)   . Headache(784.0)   . Hyperlipidemia   . Hypertension   . Iron deficiency 01/21/2017  . Microcytic anemia 01/26/2010   02/2012: Nl CBC ex H&H-10.7/34.8, MCV-69   . Obesity   . Obstructive sleep apnea   . Osteoarthritis    Left knee; right shoulder; chronic neck and back pain  . Pruritus   . Seizures ([place])   . Tremor    This started months ago after her seizure progressing to very poor hand writing  . Urinary incontinence     Past Surgical History:  Procedure Laterality Date  . ABDOMINAL HYSTERECTOMY    . BREAST EXCISIONAL BIOPSY     Left; cyst  . CATARACT EXTRACTION W/ INTRAOCULAR LENS IMPLANT Left 09/07/2013  . CHOLECYSTECTOMY    . COLONOSCOPY    . COLONOSCOPY N/A 07/20/2015   Procedure: COLONOSCOPY;  Surgeon: Rogene Houston, MD;  Location: [facility];  Service: Endoscopy;  Laterality: N/A;  930  . EYE SURGERY Left 09/07/2013   cataract    Social History   Socioeconomic  History  . Marital status: Married    Spouse name: saunders  . Number of children: 5  . Years of education: 69  . Highest education level: Not on file  Social Needs  . Financial resource strain: Not on file  . Food insecurity - worry: Not on file  . Food insecurity - inability: Not on file  . Transportation needs - medical: Not on file  . Transportation needs - non-medical: Not on file  Occupational History  . Occupation: Disabled     Employer: RETIRED  Tobacco Use  . Smoking status: Never Smoker  . Smokeless tobacco: Never Used  Substance and Sexual Activity  . Alcohol use: No    Alcohol/week: 0.0 oz  . Drug use: No  . Sexual activity: Yes    Birth control/protection: Surgical  Other Topics Concern  . Not on file  Social History Narrative   Patient lives at home with her husband Laura Atkins).  Patient is retired.    Right handed.    Five Children.    Caffeine- 2 daily    Current Outpatient Medications on File Prior to Visit  Medication Sig Dispense Refill  . acetaminophen (EXTRA STRENGTH PAIN RELIEF) 500 MG tablet Take 500 mg by mouth every 6 (six) hours as needed for pain.    Marland Kitchen  amLODipine (NORVASC) 10 MG tablet TAKE ONE TABLET BY MOUTH DAILY 90 tablet 1  . aspirin 81 MG tablet Take 81 mg by mouth daily.      Marland Kitchen atorvastatin (LIPITOR) 40 MG tablet TAKE ONE TABLET DAILY 90 tablet 1  . benazepril (LOTENSIN) 20 MG tablet TAKE ONE TABLET BY MOUTH DAILY 90 tablet 1  . Diclofenac Sodium (PENNSAID) 2 % SOLN Place 1 Squirt onto the skin 2 (two) times daily. 1 Bottle 1  . diphenhydrAMINE (BENADRYL) 25 mg capsule Take 25 mg by mouth at bedtime. Reported on 10/18/2015    . doxycycline (VIBRA-TABS) 100 MG tablet Take 1 tablet (100 mg total) by mouth 2 (two) times daily. 14 tablet 0  . gabapentin (NEURONTIN) 400 MG capsule TAKE ONE CAPSULE BY MOUTH EVERY MORNING and TAKE ONE CAPSULE AT NOON and TAKE TWO CAPSULES AT BEDTIME 360 capsule 1  . HYDROcodone-acetaminophen (NORCO/VICODIN) 5-325 MG  tablet Take 1 tablet by mouth every 6 (six) hours as needed.  0  . Multiple Vitamin (MULTIVITAMIN) capsule Take 1 capsule by mouth daily.    . naproxen (NAPROSYN) 500 MG tablet TAKE 1 TABLET BY MOUTH TWICE DAILY WITH A MEAL 30 tablet 0  . omeprazole (PRILOSEC) 20 MG capsule TAKE ONE CAPSULE BY MOUTH DAILY 90 capsule 1  . ONE TOUCH ULTRA TEST test strip USE TO TEST BLOOD SUGAR FOUR TIMES DAILY 150 each 2  . potassium chloride SA (K-DUR,KLOR-CON) 20 MEQ tablet TAKE ONE TABLET BY MOUTH TWICE DAILY 180 tablet 1  . sertraline (ZOLOFT) 100 MG tablet Take 1 tablet (100 mg total) by mouth 2 (two) times daily. 60 tablet 5  . torsemide (DEMADEX) 20 MG tablet TAKE TWO TABLETS DAILY (STOP LASIX) 180 tablet 3  . traMADol (ULTRAM) 50 MG tablet Take 1 tablet (50 mg total) by mouth every 12 (twelve) hours as needed. 40 tablet 0  . UNABLE TO FIND Walker x 1  DX unsteady gait, osteoarthritis of left knee 1 each 0  . UNABLE TO FIND Elevated [place] Seat x 1 DX: unsteady gait, back pain, osteoarthritis of left knee 1 each 0  . UNABLE TO FIND Wheeled walker ( 4 wheel) with seat  Dx unsteady gait 1 each 0  . Vitamin D, Ergocalciferol, (DRISDOL) 50000 units CAPS capsule Take 1 capsule (50,000 Units total) by mouth every 7 (seven) days. 4 capsule 5   No current facility-administered medications on file prior to visit.     Allergies  Allergen Reactions  . Penicillins Shortness Of Breath, Itching and Rash  . Prednisone Shortness Of Breath, Itching and Rash  . Propoxyphene N-Acetaminophen Itching and Nausea And Vomiting    Family History  Problem Relation Age of Onset  . Lung cancer Mother 80  . Kidney disease Father   . Diabetes Sister   . Keloids Brother   . ADD / ADHD Grandchild   . Bipolar disorder Grandchild   . Bipolar disorder Daughter   . Seizures Daughter   . Heart disease Daughter   . Kidney disease Son   . Neuropathy Son   . Kidney disease Son   . Edema Daughter   . Breast cancer Daughter  39  . Allergies Daughter   . Alcohol abuse Neg Hx   . Drug abuse Neg Hx     BP (!) 180/62 (BP Location: Left Arm, Patient Position: Sitting, Cuff Size: Normal)   Pulse 85   Wt 287 lb 9.6 oz (130.5 kg)   SpO2 97%   BMI  43.73 kg/m   Review of Systems She denies LOC.  She requests f/u UA, as she had UTI in the past.  She also has fatigue.      Objective:   Physical Exam VITAL SIGNS:  See vs page GENERAL: no distress.  Morbid obesity.  Pulses: DP pulses are present bilaterally.  MSK: no deformity of the feet or ankles.  CV: 1+ bilat edema of the legs.  Skin:  no ulcer on the feet or ankles.  Healed ulcer at the right heel is again noted. normal color and temp on the feet and ankles Neuro: sensation is intact to touch on the feet and ankles.   XID:HWYSH is bilateral onychomycosis of the toenails.    Lab Results  Component Value Date   HGBA1C 8.2 08/12/2017       Assessment & Plan:  Insulin-requiring type 2 DM, with PAD: worse Noncompliance with cbg recording and insulin dosing: we discussed.   Patient Instructions  check your blood sugar 4 times a day: before the 3 meals, and at bedtime.  also check if you have symptoms of your blood sugar being too high or too low.  please keep a record of the readings and bring it to your next appointment here.  You can write it on any piece of paper.  please call us sooner if your blood sugar goes below 70, or if you have a lot of readings over 200.  Please continue the same insulin: 60 units with breakfast (45 on sundays), and 15 units with supper.  For your safety, take this insulin right before you eat, and only when you eat the first and last meals of the day.  On this type of insulin schedule, you should eat meals on a regular schedule.  If a meal is missed or significantly delayed, your blood sugar could go low.   Please come back for a follow-up appointment in 4 months.

## 2017-08-12 NOTE — Patient Instructions (Signed)
check your blood sugar 4 times a day: before the 3 meals, and at bedtime.  also check if you have symptoms of your blood sugar being too high or too low.  please keep a record of the readings and bring it to your next appointment here.  You can write it on any piece of paper.  please call us sooner if your blood sugar goes below 70, or if you have a lot of readings over 200.  Please continue the same insulin: 60 units with breakfast (45 on sundays), and 15 units with supper.  For your safety, take this insulin right before you eat, and only when you eat the first and last meals of the day.  On this type of insulin schedule, you should eat meals on a regular schedule.  If a meal is missed or significantly delayed, your blood sugar could go low.   Please come back for a follow-up appointment in 4 months.

## 2017-08-13 ENCOUNTER — Telehealth: Payer: Self-pay | Admitting: Family Medicine

## 2017-08-13 NOTE — Telephone Encounter (Signed)
Laura Atkins, physical therapist with [facility], left message on nurse line regarding patient. She states she called patient to set up PT evaluation and patient declined for now, she wants to wait until next week. She states she thinks she has a UTI and told PT that she went to the doctor and had a urinalysis done but does not know the results. Jackelyn Poling was calling just as an Pharmacist, hospital. They will call patient again next week

## 2017-08-13 NOTE — Telephone Encounter (Signed)
noted 

## 2017-08-14 NOTE — Telephone Encounter (Signed)
Called to let her know we needed form signed.  She hung up on me.

## 2017-08-19 ENCOUNTER — Emergency Department (HOSPITAL_COMMUNITY): Payer: PPO

## 2017-08-19 ENCOUNTER — Encounter (HOSPITAL_COMMUNITY): Payer: Self-pay

## 2017-08-19 ENCOUNTER — Emergency Department (HOSPITAL_COMMUNITY)
Admission: EM | Admit: 2017-08-19 | Discharge: 2017-08-19 | Disposition: A | Discharge status: Home / Self Care | Payer: PPO | Attending: Emergency Medicine | Admitting: Emergency Medicine

## 2017-08-19 ENCOUNTER — Other Ambulatory Visit: Payer: Self-pay

## 2017-08-19 ENCOUNTER — Telehealth: Payer: Self-pay | Admitting: Endocrinology

## 2017-08-19 DIAGNOSIS — R109 Unspecified abdominal pain: Secondary | ICD-10-CM | POA: Insufficient documentation

## 2017-08-19 DIAGNOSIS — I10 Essential (primary) hypertension: Secondary | ICD-10-CM | POA: Insufficient documentation

## 2017-08-19 DIAGNOSIS — E119 Type 2 diabetes mellitus without complications: Secondary | ICD-10-CM | POA: Diagnosis not present

## 2017-08-19 DIAGNOSIS — R739 Hyperglycemia, unspecified: Secondary | ICD-10-CM | POA: Diagnosis present

## 2017-08-19 DIAGNOSIS — Z79899 Other long term (current) drug therapy: Secondary | ICD-10-CM | POA: Diagnosis not present

## 2017-08-19 DIAGNOSIS — R5383 Other fatigue: Secondary | ICD-10-CM | POA: Diagnosis not present

## 2017-08-19 DIAGNOSIS — N390 Urinary tract infection, site not specified: Secondary | ICD-10-CM | POA: Diagnosis not present

## 2017-08-19 DIAGNOSIS — Z794 Long term (current) use of insulin: Secondary | ICD-10-CM | POA: Insufficient documentation

## 2017-08-19 LAB — CBC
HCT: 34.7 % — ABNORMAL LOW (ref 36.0–46.0)
Hemoglobin: 10.4 g/dL — ABNORMAL LOW (ref 12.0–15.0)
MCH: 20.8 pg — ABNORMAL LOW (ref 26.0–34.0)
MCHC: 30 g/dL (ref 30.0–36.0)
MCV: 69.5 fL — ABNORMAL LOW (ref 78.0–100.0)
Platelets: 167 10*3/uL (ref 150–400)
RBC: 4.99 MIL/uL (ref 3.87–5.11)
RDW: 15.1 % (ref 11.5–15.5)
WBC: 6.1 10*3/uL (ref 4.0–10.5)

## 2017-08-19 LAB — URINALYSIS, ROUTINE W REFLEX MICROSCOPIC
Bilirubin Urine: NEGATIVE
Glucose, UA: 150 mg/dL — AB
Ketones, ur: NEGATIVE mg/dL
Nitrite: NEGATIVE
Protein, ur: 100 mg/dL — AB
Specific Gravity, Urine: 1.016 (ref 1.005–1.030)
pH: 5 (ref 5.0–8.0)

## 2017-08-19 LAB — COMPREHENSIVE METABOLIC PANEL
ALT: 27 U/L (ref 14–54)
AST: 28 U/L (ref 15–41)
Albumin: 4 g/dL (ref 3.5–5.0)
Alkaline Phosphatase: 104 U/L (ref 38–126)
Anion gap: 9 (ref 5–15)
BUN: 18 mg/dL (ref 6–20)
CO2: 30 mmol/L (ref 22–32)
Calcium: 9.6 mg/dL (ref 8.9–10.3)
Chloride: 99 mmol/L — ABNORMAL LOW (ref 101–111)
Creatinine, Ser: 1.04 mg/dL — ABNORMAL HIGH (ref 0.44–1.00)
GFR calc Af Amer: 60 mL/min — ABNORMAL LOW (ref >60)
GFR calc non Af Amer: 52 mL/min — ABNORMAL LOW (ref >60)
Glucose, Bld: 210 mg/dL — ABNORMAL HIGH (ref 65–99)
Potassium: 4.5 mmol/L (ref 3.5–5.1)
Sodium: 138 mmol/L (ref 135–145)
Total Bilirubin: 0.3 mg/dL (ref 0.3–1.2)
Total Protein: 8 g/dL (ref 6.5–8.1)

## 2017-08-19 LAB — CBG MONITORING, ED: Glucose-Capillary: 279 mg/dL — ABNORMAL HIGH (ref 65–99)

## 2017-08-19 LAB — I-STAT TROPONIN, ED: Troponin i, poc: 0.02 ng/mL (ref 0.00–0.08)

## 2017-08-19 LAB — I-STAT CG4 LACTIC ACID, ED: Lactic Acid, Venous: 0.9 mmol/L (ref 0.5–1.9)

## 2017-08-19 LAB — LIPASE, BLOOD: Lipase: 40 U/L (ref 11–51)

## 2017-08-19 MED ORDER — IOPAMIDOL (ISOVUE-300) INJECTION 61%
INTRAVENOUS | Status: AC
  Administered 2017-08-19: 100 mL
  Filled 2017-08-19: qty 100

## 2017-08-19 MED ORDER — ONDANSETRON HCL 4 MG PO TABS: 4.0000 mg | ORAL_TABLET | Freq: Every day | ORAL | 0 refills | Status: DC | PRN

## 2017-08-19 MED ORDER — NITROFURANTOIN MONOHYD MACRO 100 MG PO CAPS: 100.0000 mg | ORAL_CAPSULE | Freq: Two times a day (BID) | ORAL | 0 refills | Status: DC

## 2017-08-19 MED ORDER — DEXTROSE 5 % IV SOLN
1.0000 g | Freq: Once | INTRAVENOUS | Status: AC
  Administered 2017-08-19: 1 g via INTRAVENOUS
  Filled 2017-08-19: qty 10

## 2017-08-19 MED ORDER — CIPROFLOXACIN HCL 500 MG PO TABS: 500.0000 mg | ORAL_TABLET | Freq: Two times a day (BID) | ORAL | 0 refills | Status: AC

## 2017-08-19 MED ORDER — CIPROFLOXACIN HCL 500 MG PO TABS: 500.0000 mg | ORAL_TABLET | Freq: Two times a day (BID) | ORAL | 0 refills | Status: DC

## 2017-08-19 NOTE — ED Notes (Signed)
Pt aware urine sample needed, cup provided

## 2017-08-19 NOTE — ED Triage Notes (Signed)
POV arrival c/o high blood sugars at home x5 days (368 PTA).  Frequent urination with pain, pressure.  Fatigued for the last week.

## 2017-08-19 NOTE — Telephone Encounter (Signed)
Laura Atkins daughter [phone] called to say pt weak cannot keep food down blood sugar 454 then bottoms out. Appetite gone. Urinary incontinence. Family is taking pt to [facility] now. Please let Loanne Drilling know.

## 2017-08-19 NOTE — ED Notes (Signed)
Patient returned from CT. No distress noted.

## 2017-08-19 NOTE — ED Provider Notes (Signed)
[facility] EMERGENCY DEPARTMENT Provider Note   CSN: [id number] Arrival date & time: 08/19/17  1220     History   Chief Complaint Chief Complaint  Patient presents with  . Hyperglycemia  . Urinary Frequency  . Fatigue   HPI  Laura Atkins is a 76 y.o. female presenting with hyperglycemia and urinary frequency.  She lives at home with her husband who states she has not been eating and drinking as much recently due to abdominal pain. Family has been trying to get her to drink more water.  Abdominal pain is central and does not radiate.  She has been checking her CBGs 3x a day and has been compliant with her insulin.   Blood sugars usually range between 150-180 however recently over last 2-3 days have been 350-400.  She also has been having some burning with urination in addition with increase in frequency.  Also reports foul odor.   Denies nausea and vomiting.   Denies episodes of DKA in the past.   No fevers, chills, nausea, vomiting.    Past Medical History:  Diagnosis Date  . Alpha thalassemia trait 01/26/2010   02/2012: Nl CBC ex H&H-10.7/34.8, MCV-69   . Anemia   . Anxiety   . Anxiety and depression   . Depression   . Diabetes mellitus   . GERD (gastroesophageal reflux disease)   . Headache(784.0)   . Hyperlipidemia   . Hypertension   . Iron deficiency 01/21/2017  . Microcytic anemia 01/26/2010   02/2012: Nl CBC ex H&H-10.7/34.8, MCV-69   . Obesity   . Obstructive sleep apnea   . Osteoarthritis    Left knee; right shoulder; chronic neck and back pain  . Pruritus   . Seizures ([place])   . Tremor    This started months ago after her seizure progressing to very poor hand writing  . Urinary incontinence    Patient Active Problem List   Diagnosis Date Noted  . Cellulitis 05/09/2017  . Knee pain, left 02/11/2017  . Iron deficiency 01/21/2017  . Vitamin D deficiency 10/15/2016  . Urinary frequency 10/15/2016  . Limited mobility 05/27/2016  . Diabetes  mellitus type 2 in obese ([place]) 02/04/2016  . Sleep terror disorder 12/14/2015  . Fecal incontinence 12/13/2015  . Back pain of lumbar region with sciatica 04/15/2015  . Shoulder pain, right 04/14/2015  . Spondylosis, cervical, with myelopathy 04/14/2015  . PVD (peripheral vascular disease) ([place]) 01/28/2014  . OSA (obstructive sleep apnea) 04/10/2013  . Peripheral neuropathy 03/12/2013  . UTI (urinary tract infection) 01/18/2013  . Hearing loss 11/06/2012  . UNSTEADY GAIT 11/07/2010  . Alpha thalassemia trait 01/26/2010  . NECK PAIN, CHRONIC 10/21/2008  . Hyperlipemia 04/21/2008  . Morbid obesity ([place]) 04/21/2008  . Anxiety and depression 04/21/2008  . Essential hypertension 04/21/2008  . GERD 04/21/2008  . Osteoarthritis of left knee 04/21/2008  . URINARY INCONTINENCE 04/21/2008   Past Surgical History:  Procedure Laterality Date  . ABDOMINAL HYSTERECTOMY    . BREAST EXCISIONAL BIOPSY     Left; cyst  . CATARACT EXTRACTION W/ INTRAOCULAR LENS IMPLANT Left 09/07/2013  . CHOLECYSTECTOMY    . COLONOSCOPY    . COLONOSCOPY N/A 07/20/2015   Procedure: COLONOSCOPY;  Surgeon: Rogene Houston, MD;  Location: [facility];  Service: Endoscopy;  Laterality: N/A;  930  . EYE SURGERY Left 09/07/2013   cataract    OB History    No data available      Home Medications  Prior to Admission medications   Medication Sig Start Date End Date Taking? Authorizing Provider  acetaminophen (EXTRA STRENGTH PAIN RELIEF) 500 MG tablet Take 500 mg by mouth every 6 (six) hours as needed for pain.   Yes [provider]  amLODipine (NORVASC) 10 MG tablet TAKE ONE TABLET BY MOUTH DAILY 03/04/17  Yes Fayrene Helper, MD  aspirin 81 MG tablet Take 81 mg by mouth daily.     Yes [provider]  atorvastatin (LIPITOR) 40 MG tablet TAKE ONE TABLET DAILY 03/04/17  Yes Fayrene Helper, MD  benazepril (LOTENSIN) 20 MG tablet TAKE ONE TABLET BY MOUTH DAILY 03/04/17  Yes Fayrene Helper, MD  diphenhydrAMINE (BENADRYL) 25 mg capsule Take 25 mg by mouth at bedtime. Reported on 10/18/2015   Yes [provider]  gabapentin (NEURONTIN) 400 MG capsule TAKE ONE CAPSULE BY MOUTH EVERY MORNING and TAKE ONE CAPSULE AT NOON and TAKE TWO CAPSULES AT BEDTIME 04/02/17  Yes Fayrene Helper, MD  HYDROcodone-acetaminophen (NORCO/VICODIN) 5-325 MG tablet Take 1 tablet by mouth every 6 (six) hours as needed. 12/18/16  Yes [provider]  insulin NPH-regular Human (NOVOLIN 70/30) (70-30) 100 UNIT/ML injection 60 units with breakfast and 15 units with supper, and syringes 2/day. 08/12/17  Yes Renato Shin, MD  Multiple Vitamin (MULTIVITAMIN) capsule Take 1 capsule by mouth daily.   Yes [provider]  naproxen (NAPROSYN) 500 MG tablet TAKE 1 TABLET BY MOUTH TWICE DAILY WITH A MEAL 04/02/16  Yes Fayrene Helper, MD  omeprazole (PRILOSEC) 20 MG capsule TAKE ONE CAPSULE BY MOUTH DAILY 03/04/17  Yes Fayrene Helper, MD  potassium chloride SA (K-DUR,KLOR-CON) 20 MEQ tablet TAKE ONE TABLET BY MOUTH TWICE DAILY 05/01/17  Yes Fayrene Helper, MD  sertraline (ZOLOFT) 100 MG tablet Take 1 tablet (100 mg total) by mouth 2 (two) times daily. 05/14/17  Yes Cloria Spring, MD  torsemide (DEMADEX) 20 MG tablet TAKE TWO TABLETS DAILY (STOP LASIX) Patient taking differently: TAKE TWO TABLETS 20MG   DAILY (STOP LASIX) 07/01/17  Yes Branch, Alphonse Guild, MD  Vitamin D, Ergocalciferol, (DRISDOL) 50000 units CAPS capsule Take 1 capsule (50,000 Units total) by mouth every 7 (seven) days. 03/16/16  Yes Fayrene Helper, MD  ciprofloxacin (CIPRO) 500 MG tablet Take 1 tablet (500 mg total) by mouth 2 (two) times daily for 7 days. 08/19/17 08/26/17  Lovenia Kim, MD  Diclofenac Sodium (PENNSAID) 2 % SOLN Place 1 Squirt onto the skin 2 (two) times daily. Patient not taking: Reported on 08/19/2017 04/01/17   Garald Balding, MD  ondansetron (ZOFRAN) 4 MG tablet Take 1 tablet (4 mg  total) by mouth daily as needed for nausea or vomiting. 08/19/17 08/19/18  Lovenia Kim, MD  ONE TOUCH ULTRA TEST test strip USE TO TEST BLOOD SUGAR FOUR TIMES DAILY 10/10/16   Renato Shin, MD  traMADol (ULTRAM) 50 MG tablet Take 1 tablet (50 mg total) by mouth every 12 (twelve) hours as needed. Patient not taking: Reported on 08/19/2017 10/15/16   Fayrene Helper, MD  UNABLE TO FIND Walker x 1  DX unsteady gait, osteoarthritis of left knee 02/28/17   Fayrene Helper, MD  UNABLE TO FIND Elevated [place] Seat x 1 DX: unsteady gait, back pain, osteoarthritis of left knee 02/28/17   Fayrene Helper, MD  UNABLE TO FIND Wheeled walker ( 4 wheel) with seat  Dx unsteady gait 03/26/17   Fayrene Helper, MD   Family History Family  History  Problem Relation Age of Onset  . Lung cancer Mother 26  . Kidney disease Father   . Diabetes Sister   . Keloids Brother   . ADD / ADHD Grandchild   . Bipolar disorder Grandchild   . Bipolar disorder Daughter   . Seizures Daughter   . Heart disease Daughter   . Kidney disease Son   . Neuropathy Son   . Kidney disease Son   . Edema Daughter   . Breast cancer Daughter 62  . Allergies Daughter   . Alcohol abuse Neg Hx   . Drug abuse Neg Hx     Social History Social History   Tobacco Use  . Smoking status: Never Smoker  . Smokeless tobacco: Never Used  Substance Use Topics  . Alcohol use: No    Alcohol/week: 0.0 oz  . Drug use: No   Allergies   Penicillins; Prednisone; and Propoxyphene n-acetaminophen  Review of Systems Review of Systems  Constitutional: Positive for appetite change. Negative for chills and fever.  HENT: Negative for congestion, rhinorrhea and sore throat.   Respiratory: Negative for cough and shortness of breath.   Cardiovascular: Negative for chest pain.  Gastrointestinal: Positive for abdominal pain. Negative for diarrhea, nausea and vomiting.  Genitourinary: Positive for dysuria, frequency and urgency.  Negative for difficulty urinating.   Physical Exam Updated Vital Signs BP (!) 141/54   Pulse 72   Temp 98.8 F (37.1 C) (Oral)   Resp 15   Ht 5\' 8"  (1.727 m)   Wt 130.2 kg (287 lb)   SpO2 100%   BMI 43.64 kg/m   Physical Exam Gen-76 year old female, no acute distress Skin -warm, dry, not diaphoretic HEENT -NCAT, PERRLA, MMM Neck -supple Chest -CTA B, nonlabored Heart -RRR no MRG Abdomen -soft, nondistended, tenderness to palpation over epigastric area, positive bowel sounds Musculoskeletal -no edema  Neuro -  alert, oriented x3, no focal deficits, strength is 5 out of 5 bilateral upper and lower extremities, normal sensation   ED Treatments / Results  Labs (all labs ordered are listed, but only abnormal results are displayed) Labs Reviewed  URINALYSIS, ROUTINE W REFLEX MICROSCOPIC - Abnormal; Notable for the following components:      Result Value   APPearance CLOUDY (*)    Glucose, UA 150 (*)    Hgb urine dipstick LARGE (*)    Protein, ur 100 (*)    Leukocytes, UA LARGE (*)    Bacteria, UA MANY (*)    Squamous Epithelial / LPF 0-5 (*)    Non Squamous Epithelial 0-5 (*)    All other components within normal limits  CBC - Abnormal; Notable for the following components:   Hemoglobin 10.4 (*)    HCT 34.7 (*)    MCV 69.5 (*)    MCH 20.8 (*)    All other components within normal limits  COMPREHENSIVE METABOLIC PANEL - Abnormal; Notable for the following components:   Chloride 99 (*)    Glucose, Bld 210 (*)    Creatinine, Ser 1.04 (*)    GFR calc non Af Amer 52 (*)    GFR calc Af Amer 60 (*)    All other components within normal limits  CBG MONITORING, ED - Abnormal; Notable for the following components:   Glucose-Capillary 279 (*)    All other components within normal limits  URINE CULTURE  LIPASE, BLOOD  I-STAT CG4 LACTIC ACID, ED  I-STAT TROPONIN, ED   EKG  EKG Interpretation None  Radiology Ct Abdomen Pelvis W Contrast  Result Date:  08/19/2017 CLINICAL DATA:  Five day history of lower abdominal pain. EXAM: CT ABDOMEN AND PELVIS WITH CONTRAST TECHNIQUE: Multidetector CT imaging of the abdomen and pelvis was performed using the standard protocol following bolus administration of intravenous contrast. CONTRAST:  <See Chart> ISOVUE-300 IOPAMIDOL (ISOVUE-300) INJECTION 61% COMPARISON:  08/04/2015 FINDINGS: Lower chest:  Unremarkable. Hepatobiliary: Subtle nodularity of liver contour raises the question of cirrhosis. Tiny low-density lesion inferior right liver is unchanged. Gallbladder surgically absent. No intrahepatic or extrahepatic biliary dilation. Pancreas: No focal mass lesion. No dilatation of the main duct. No intraparenchymal cyst. No peripancreatic edema. Spleen: No splenomegaly. No focal mass lesion. Adrenals/Urinary Tract: No adrenal nodule or mass. Tiny low-density lesion interpolar left kidney is similar to prior and likely a cyst. No evidence for hydroureter. The urinary bladder appears normal for the degree of distention. Stomach/Bowel: Stomach is nondistended. No gastric wall thickening. No evidence of outlet obstruction. Duodenum is normally positioned as is the ligament of Treitz. No small bowel wall thickening. No small bowel dilatation. The terminal ileum is normal. The appendix is normal. No gross colonic mass. No colonic wall thickening. No substantial diverticular change. Vascular/Lymphatic: There is abdominal aortic atherosclerosis without aneurysm. There is no gastrohepatic or hepatoduodenal ligament lymphadenopathy. No intraperitoneal or retroperitoneal lymphadenopathy. No pelvic sidewall lymphadenopathy. Upper normal left common iliac lymph node is similar to prior. Reproductive: Uterus surgically absent.  There is no adnexal mass. Other: No intraperitoneal free fluid. Musculoskeletal: Pelvic floor laxity. Bone windows reveal no worrisome lytic or sclerotic osseous lesions. IMPRESSION: 1. No acute findings in the  abdomen or pelvis. Specifically, no findings to explain the patient's history of pain. 2. Subtle nodularity of liver contour raises the question of cirrhosis. 3. Tiny hypodensity identified in the inferior right liver on the prior study is stable. 4.  Aortic Atherosclerois (ICD10-170.0) Electronically Signed   By: Misty Stanley M.D.   On: 08/19/2017 20:23   Procedures Procedures (including critical care time)  Medications Ordered in ED Medications  cefTRIAXone (ROCEPHIN) 1 g in dextrose 5 % 50 mL IVPB (1 g Intravenous New Bag/Given 08/19/17 2005)  iopamidol (ISOVUE-300) 61 % injection (100 mLs  Contrast Given 08/19/17 1927)    Initial Impression / Assessment and Plan / ED Course  I have reviewed the triage vital signs and the nursing notes.  Pertinent labs & imaging results that were available during my care of the patient were reviewed by me and considered in my medical decision making (see chart for details).  76 year old female presenting with 3-4 days of hyperglycemia and urinary frequency.  ED CBG of 279.  DKA considered however labs unremarkable with normal anion gap and white count.    Could consider ACS with abdominal pain in older patient however istat troponin 0.02.   UA with large leuks and bacteria, wbc in clumps, negative nitrite.  She was given one dose of Rocephin in the ED for treatment.  Lactic acid and lipase wnl.  CT A/P showed no acute findings.  She was discharged home with Rx for Zofran and Ciprofloxacin.  -Follow up with PCP in 1 week to ensure CBGs are better controlled   -Return precautions discussed   Final Clinical Impressions(s) / ED Diagnoses   Final diagnoses:  Urinary tract infection without hematuria, site unspecified   ED Discharge Orders        Ordered    ondansetron (ZOFRAN) 4 MG tablet  Daily PRN,   Status:  Discontinued     08/19/17 2046    nitrofurantoin, macrocrystal-monohydrate, (MACROBID) 100 MG capsule  2 times daily,   Status:  Discontinued      08/19/17 2046    ondansetron (ZOFRAN) 4 MG tablet  Daily PRN     08/19/17 2052    ciprofloxacin (CIPRO) 500 MG tablet  2 times daily,   Status:  Discontinued     08/19/17 2052    ciprofloxacin (CIPRO) 500 MG tablet  2 times daily     08/19/17 2058     Lovenia Kim, MD [facility], PGY-2    Lovenia Kim, MD 08/19/17 2059    Rex Kras, Wenda Overland, MD 08/19/17 2348

## 2017-08-19 NOTE — Discharge Instructions (Addendum)
You were seen in the ER for abdominal pain and elevated blood sugars.  Your bloodwork and imaging were normal however you were found to have a urinary tract infection.  I am prescribing you an antibiotic and would like you to take this for 7 days.  Additionally, I have prescribed some Zofran for your nausea.  You can follow up with your PCP for ongoing management of your diabetes and to make sure your sugars are better controlled.

## 2017-08-19 NOTE — ED Notes (Signed)
ED resident at bedside.

## 2017-08-19 NOTE — ED Notes (Signed)
Patient brought to room in wheelchair being placed in gown and hooked into monitor.

## 2017-08-19 NOTE — ED Notes (Signed)
CBG 279

## 2017-08-19 NOTE — ED Notes (Signed)
Patient being taken to bathroom by EMT for urine sample.

## 2017-08-19 NOTE — ED Notes (Signed)
Patient transported to CT 

## 2017-08-20 LAB — URINE CULTURE

## 2017-08-26 ENCOUNTER — Telehealth: Payer: Self-pay | Admitting: *Deleted

## 2017-08-26 DIAGNOSIS — Z794 Long term (current) use of insulin: Secondary | ICD-10-CM | POA: Diagnosis not present

## 2017-08-26 DIAGNOSIS — H9190 Unspecified hearing loss, unspecified ear: Secondary | ICD-10-CM | POA: Diagnosis not present

## 2017-08-26 DIAGNOSIS — Z7982 Long term (current) use of aspirin: Secondary | ICD-10-CM | POA: Diagnosis not present

## 2017-08-26 DIAGNOSIS — M25561 Pain in right knee: Secondary | ICD-10-CM | POA: Diagnosis not present

## 2017-08-26 DIAGNOSIS — M25511 Pain in right shoulder: Secondary | ICD-10-CM | POA: Diagnosis not present

## 2017-08-26 DIAGNOSIS — M25562 Pain in left knee: Secondary | ICD-10-CM | POA: Diagnosis not present

## 2017-08-26 DIAGNOSIS — E669 Obesity, unspecified: Secondary | ICD-10-CM | POA: Diagnosis not present

## 2017-08-26 DIAGNOSIS — M4712 Other spondylosis with myelopathy, cervical region: Secondary | ICD-10-CM | POA: Diagnosis not present

## 2017-08-26 DIAGNOSIS — M1712 Unilateral primary osteoarthritis, left knee: Secondary | ICD-10-CM | POA: Diagnosis not present

## 2017-08-26 DIAGNOSIS — I10 Essential (primary) hypertension: Secondary | ICD-10-CM | POA: Diagnosis not present

## 2017-08-26 DIAGNOSIS — Z7409 Other reduced mobility: Secondary | ICD-10-CM

## 2017-08-26 DIAGNOSIS — E1169 Type 2 diabetes mellitus with other specified complication: Secondary | ICD-10-CM | POA: Diagnosis not present

## 2017-08-26 NOTE — Telephone Encounter (Signed)
Verl Dicker called from [facility] left a message stating she went to see the patient today and thought things went really well, per Debbie patient stated they are not home care fans and she would like to go to [facility] in [place]. Per Debbie patient told her she did not do well with the last out patient therapist that came to her home.  [phone]

## 2017-08-27 NOTE — Telephone Encounter (Signed)
I spoke with Jackelyn Poling , and she is discharged from in home physical therapy for bilateral knee pain, she wihe is to be referred to the facility that she wishes to go to twice weekly for 6 weeks please enter , I will sign and I have let the family know of change in physical therapy arrangement , I spoke with the only person there a the time who identified herself as patient's son's girlfriend, Skarlette was sleeping reportedly. She also verified that fmily wants out patient and not in home PT

## 2017-08-27 NOTE — Telephone Encounter (Signed)
Referral entered  

## 2017-08-28 IMAGING — DX DG FOOT COMPLETE 3+V*L*
3 series · 3 of 3 positions shown · non-contrast
Comparison: None.

CLINICAL DATA: Left foot pain.  Fall

EXAM:
LEFT FOOT - COMPLETE 3+ VIEW

[foot ap]
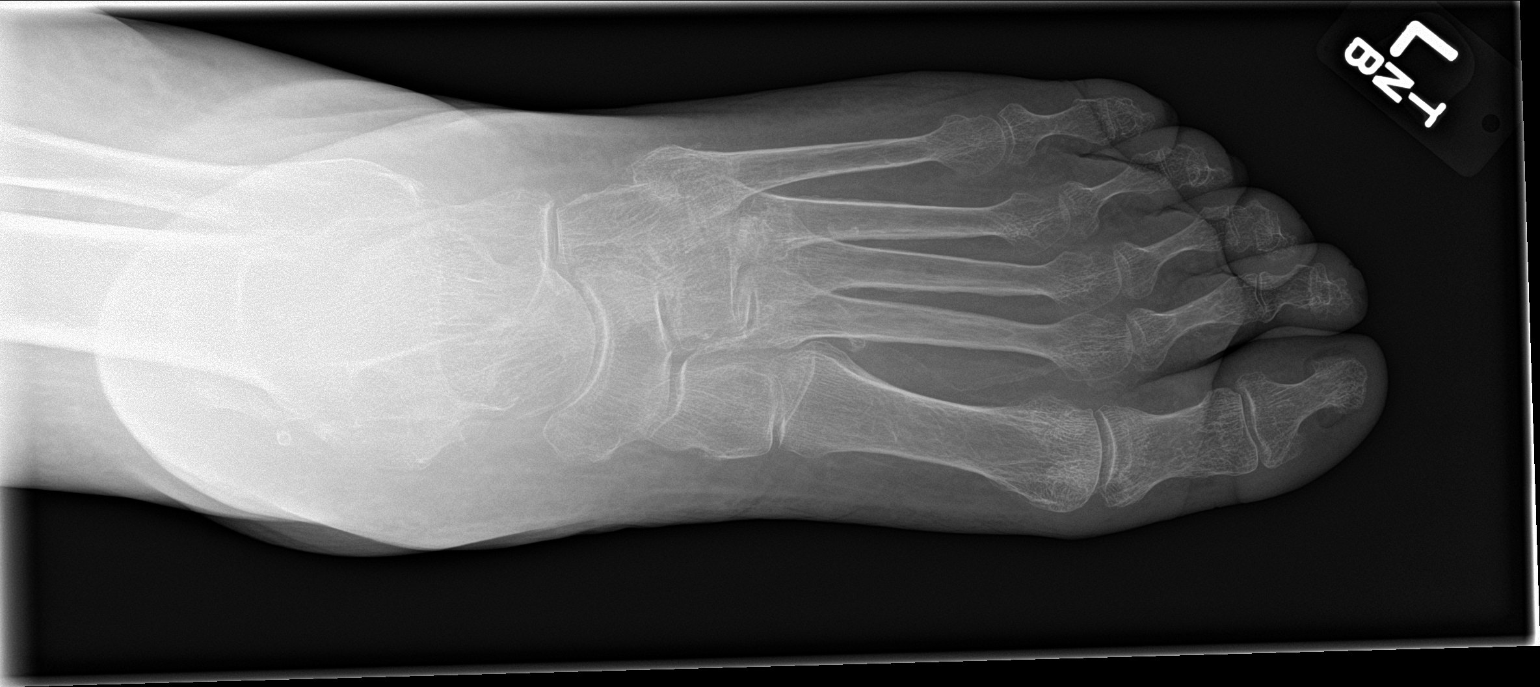

[foot obl]
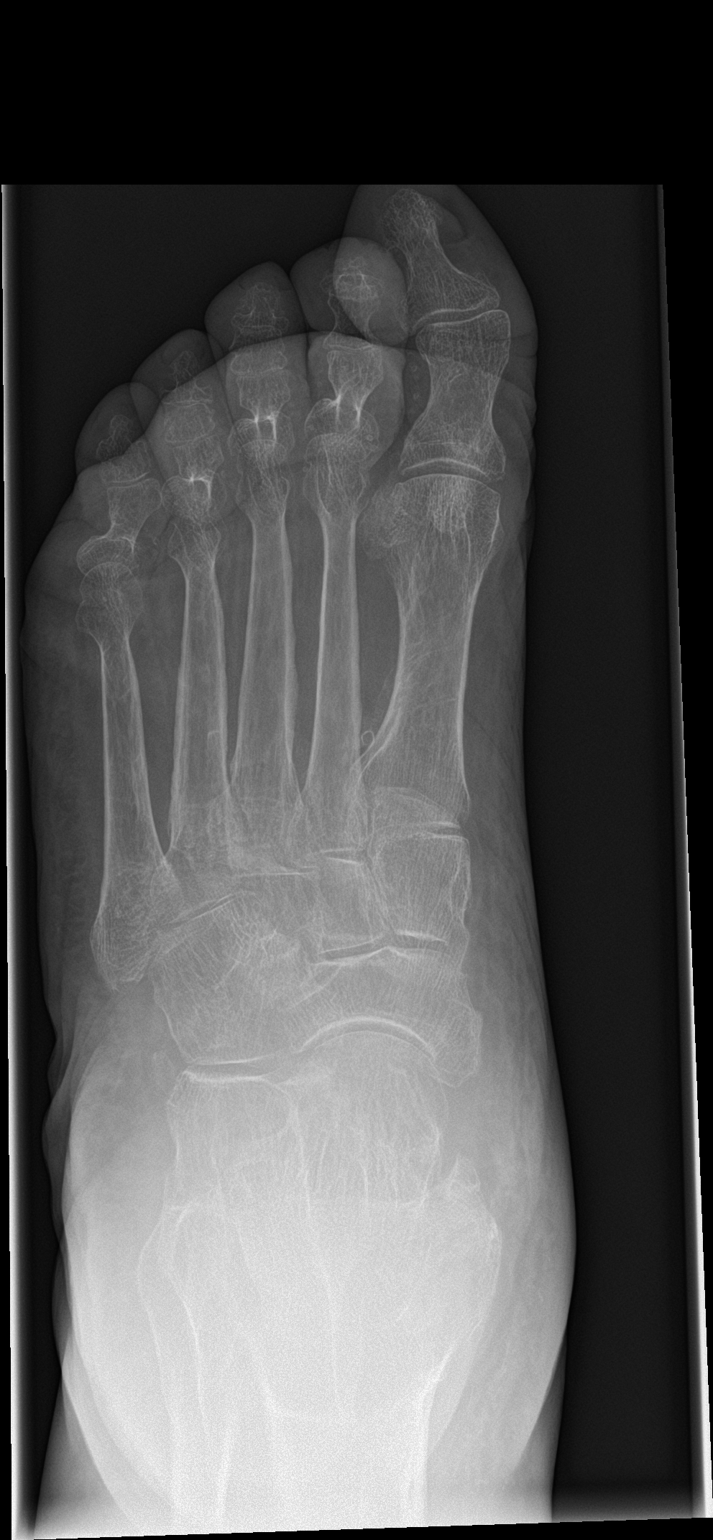

[foot lat]
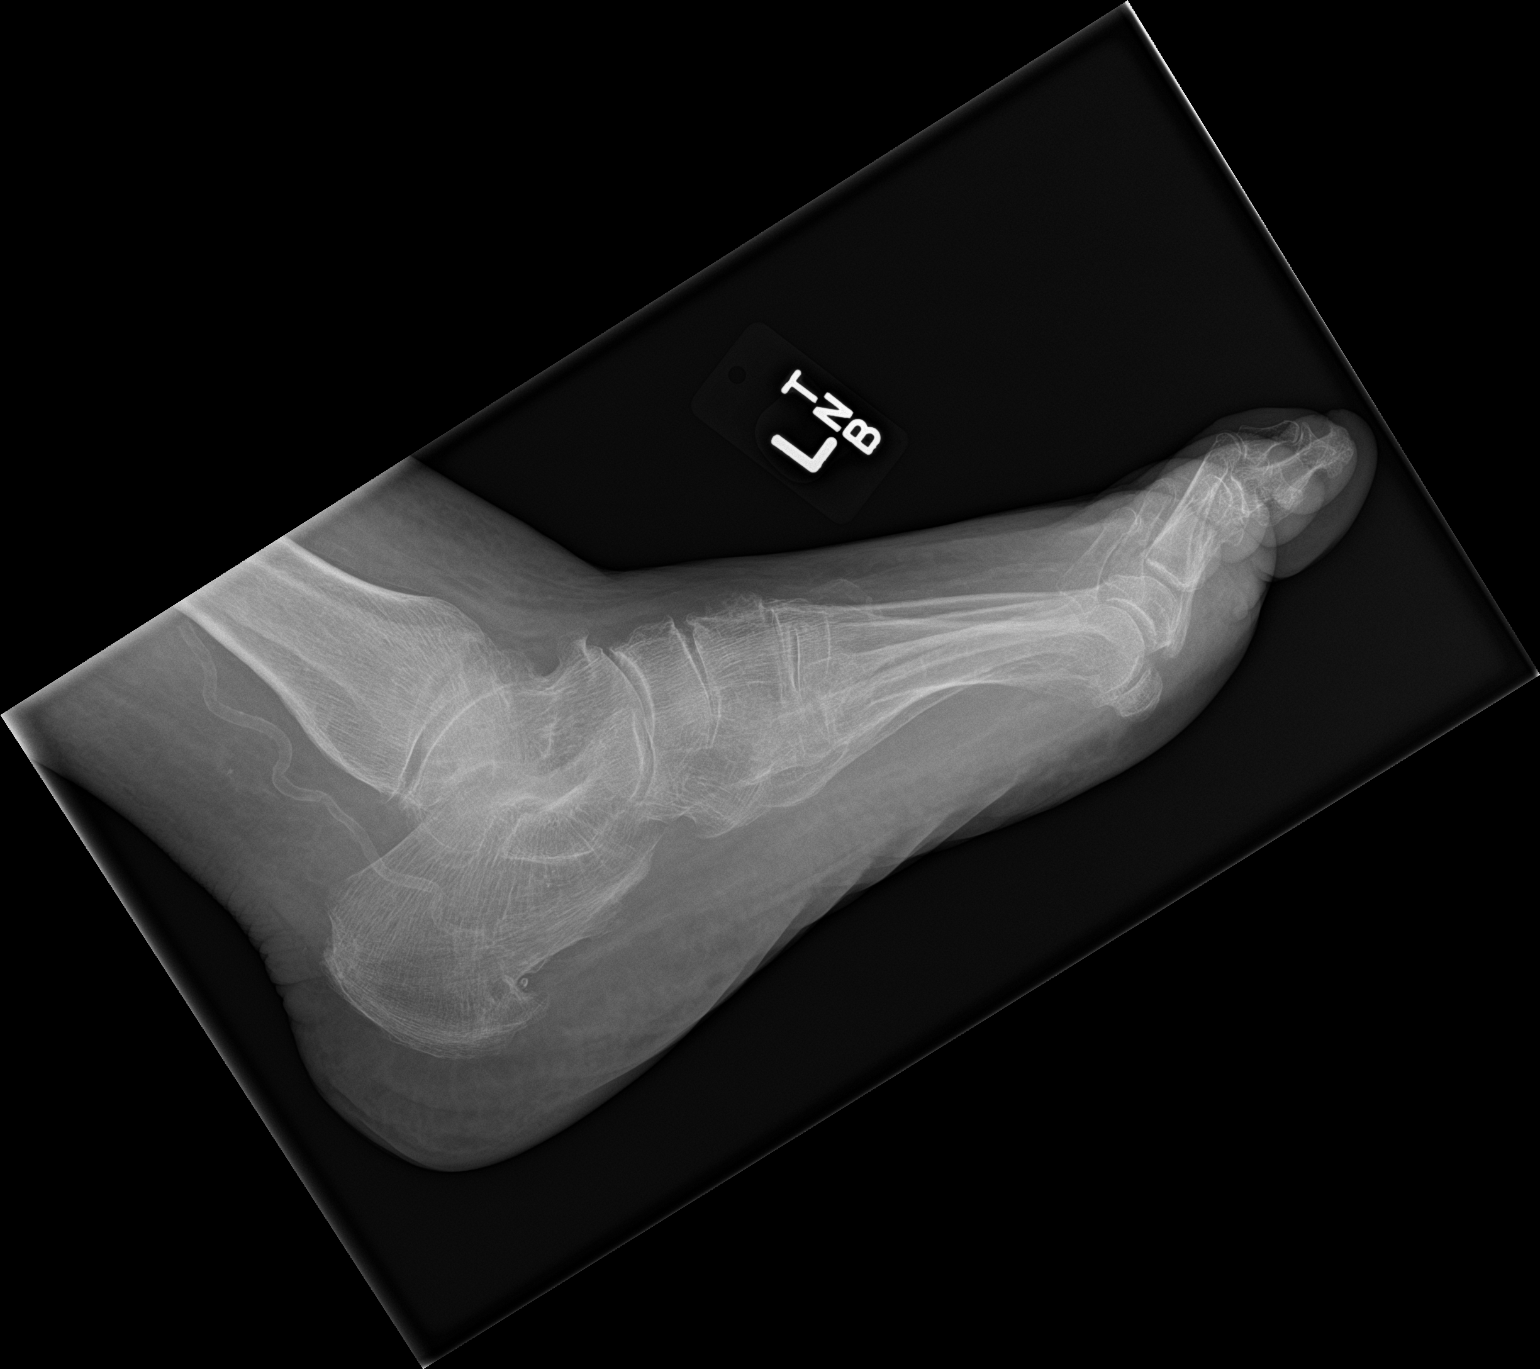

[3 of 3 positions shown; findings below may reference images not displayed]

FINDINGS: Negative for fracture. Midfoot degenerative change. Calcaneal
spurring. Extensive arterial calcification. No significant
arthropathy.
IMPRESSION: Negative for fracture.

## 2017-09-01 ENCOUNTER — Other Ambulatory Visit: Payer: Self-pay | Admitting: Family Medicine

## 2017-09-05 DIAGNOSIS — M6281 Muscle weakness (generalized): Secondary | ICD-10-CM | POA: Diagnosis not present

## 2017-09-05 DIAGNOSIS — M25662 Stiffness of left knee, not elsewhere classified: Secondary | ICD-10-CM | POA: Diagnosis not present

## 2017-09-05 DIAGNOSIS — M25562 Pain in left knee: Secondary | ICD-10-CM | POA: Diagnosis not present

## 2017-09-05 DIAGNOSIS — R262 Difficulty in walking, not elsewhere classified: Secondary | ICD-10-CM | POA: Diagnosis not present

## 2017-09-06 DIAGNOSIS — M25562 Pain in left knee: Secondary | ICD-10-CM | POA: Diagnosis not present

## 2017-09-06 DIAGNOSIS — R262 Difficulty in walking, not elsewhere classified: Secondary | ICD-10-CM | POA: Diagnosis not present

## 2017-09-06 DIAGNOSIS — M25662 Stiffness of left knee, not elsewhere classified: Secondary | ICD-10-CM | POA: Diagnosis not present

## 2017-09-06 DIAGNOSIS — M6281 Muscle weakness (generalized): Secondary | ICD-10-CM | POA: Diagnosis not present

## 2017-09-09 ENCOUNTER — Ambulatory Visit (INDEPENDENT_AMBULATORY_CARE_PROVIDER_SITE_OTHER): Payer: Self-pay | Admitting: Otolaryngology

## 2017-09-09 DIAGNOSIS — R262 Difficulty in walking, not elsewhere classified: Secondary | ICD-10-CM | POA: Diagnosis not present

## 2017-09-09 DIAGNOSIS — M25662 Stiffness of left knee, not elsewhere classified: Secondary | ICD-10-CM | POA: Diagnosis not present

## 2017-09-09 DIAGNOSIS — M6281 Muscle weakness (generalized): Secondary | ICD-10-CM | POA: Diagnosis not present

## 2017-09-09 DIAGNOSIS — M25562 Pain in left knee: Secondary | ICD-10-CM | POA: Diagnosis not present

## 2017-09-10 DIAGNOSIS — M25569 Pain in unspecified knee: Secondary | ICD-10-CM | POA: Diagnosis not present

## 2017-09-10 DIAGNOSIS — M25562 Pain in left knee: Secondary | ICD-10-CM | POA: Diagnosis not present

## 2017-09-10 DIAGNOSIS — G603 Idiopathic progressive neuropathy: Secondary | ICD-10-CM | POA: Diagnosis not present

## 2017-09-10 DIAGNOSIS — M4712 Other spondylosis with myelopathy, cervical region: Secondary | ICD-10-CM | POA: Diagnosis not present

## 2017-09-10 DIAGNOSIS — M25561 Pain in right knee: Secondary | ICD-10-CM | POA: Diagnosis not present

## 2017-09-10 DIAGNOSIS — M1712 Unilateral primary osteoarthritis, left knee: Secondary | ICD-10-CM | POA: Diagnosis not present

## 2017-09-10 DIAGNOSIS — M79669 Pain in unspecified lower leg: Secondary | ICD-10-CM | POA: Diagnosis not present

## 2017-09-10 DIAGNOSIS — E1140 Type 2 diabetes mellitus with diabetic neuropathy, unspecified: Secondary | ICD-10-CM | POA: Diagnosis not present

## 2017-09-11 DIAGNOSIS — R262 Difficulty in walking, not elsewhere classified: Secondary | ICD-10-CM | POA: Diagnosis not present

## 2017-09-11 DIAGNOSIS — M25562 Pain in left knee: Secondary | ICD-10-CM | POA: Diagnosis not present

## 2017-09-11 DIAGNOSIS — M6281 Muscle weakness (generalized): Secondary | ICD-10-CM | POA: Diagnosis not present

## 2017-09-11 DIAGNOSIS — M25662 Stiffness of left knee, not elsewhere classified: Secondary | ICD-10-CM | POA: Diagnosis not present

## 2017-09-12 DIAGNOSIS — M25562 Pain in left knee: Secondary | ICD-10-CM | POA: Diagnosis not present

## 2017-09-12 DIAGNOSIS — R262 Difficulty in walking, not elsewhere classified: Secondary | ICD-10-CM | POA: Diagnosis not present

## 2017-09-12 DIAGNOSIS — M6281 Muscle weakness (generalized): Secondary | ICD-10-CM | POA: Diagnosis not present

## 2017-09-12 DIAGNOSIS — M25662 Stiffness of left knee, not elsewhere classified: Secondary | ICD-10-CM | POA: Diagnosis not present

## 2017-09-13 ENCOUNTER — Ambulatory Visit (HOSPITAL_COMMUNITY): Payer: PPO | Admitting: Psychiatry

## 2017-09-18 DIAGNOSIS — M25562 Pain in left knee: Secondary | ICD-10-CM | POA: Diagnosis not present

## 2017-09-18 DIAGNOSIS — R262 Difficulty in walking, not elsewhere classified: Secondary | ICD-10-CM | POA: Diagnosis not present

## 2017-09-18 DIAGNOSIS — M25662 Stiffness of left knee, not elsewhere classified: Secondary | ICD-10-CM | POA: Diagnosis not present

## 2017-09-18 DIAGNOSIS — M6281 Muscle weakness (generalized): Secondary | ICD-10-CM | POA: Diagnosis not present

## 2017-09-19 ENCOUNTER — Other Ambulatory Visit: Payer: Self-pay | Admitting: Pharmacy Technician

## 2017-09-19 DIAGNOSIS — R262 Difficulty in walking, not elsewhere classified: Secondary | ICD-10-CM | POA: Diagnosis not present

## 2017-09-19 DIAGNOSIS — M25662 Stiffness of left knee, not elsewhere classified: Secondary | ICD-10-CM | POA: Diagnosis not present

## 2017-09-19 DIAGNOSIS — M25562 Pain in left knee: Secondary | ICD-10-CM | POA: Diagnosis not present

## 2017-09-19 DIAGNOSIS — M6281 Muscle weakness (generalized): Secondary | ICD-10-CM | POA: Diagnosis not present

## 2017-09-19 NOTE — Patient Outreach (Signed)
[facility]) Care Management  09/19/2017  MIKEISHA LEMONDS December 10, 1940 [id number]  Incoming [employer] EMMI call in reference to medication adherence. HIPAA identifiers verified and verbal consent received. Patient states she takes all of her medications daily as prescribed and does not have any barriers that would affect her adherence.  Doreene Burke, [place] [phone]

## 2017-09-20 ENCOUNTER — Other Ambulatory Visit (HOSPITAL_COMMUNITY): Payer: Self-pay | Admitting: *Deleted

## 2017-09-20 DIAGNOSIS — M25562 Pain in left knee: Secondary | ICD-10-CM | POA: Diagnosis not present

## 2017-09-20 DIAGNOSIS — D563 Thalassemia minor: Secondary | ICD-10-CM

## 2017-09-20 DIAGNOSIS — R262 Difficulty in walking, not elsewhere classified: Secondary | ICD-10-CM | POA: Diagnosis not present

## 2017-09-20 DIAGNOSIS — M6281 Muscle weakness (generalized): Secondary | ICD-10-CM | POA: Diagnosis not present

## 2017-09-20 DIAGNOSIS — M25662 Stiffness of left knee, not elsewhere classified: Secondary | ICD-10-CM | POA: Diagnosis not present

## 2017-09-23 ENCOUNTER — Ambulatory Visit (HOSPITAL_COMMUNITY): Payer: Self-pay | Admitting: Oncology

## 2017-09-23 ENCOUNTER — Other Ambulatory Visit (HOSPITAL_COMMUNITY): Payer: Self-pay

## 2017-09-23 DIAGNOSIS — M6281 Muscle weakness (generalized): Secondary | ICD-10-CM | POA: Diagnosis not present

## 2017-09-23 DIAGNOSIS — R262 Difficulty in walking, not elsewhere classified: Secondary | ICD-10-CM | POA: Diagnosis not present

## 2017-09-23 DIAGNOSIS — M25562 Pain in left knee: Secondary | ICD-10-CM | POA: Diagnosis not present

## 2017-09-23 DIAGNOSIS — M25662 Stiffness of left knee, not elsewhere classified: Secondary | ICD-10-CM | POA: Diagnosis not present

## 2017-09-23 NOTE — Progress Notes (Deleted)
Laura Atkins, [place], [place] [address]  No diagnosis found.  CURRENT THERAPY: Work-up.  INTERVAL HISTORY: Laura Atkins 76 y.o. female returns for followup of alpha thalassemia trait in the setting of chronic renal disease, Stage III.  Patient states that she feels well. She does have some fatigue however it is mild. She complains of bilateral lower extremity edema. Otherwise she has no complaints.  Review of Systems  Constitutional: Negative.  Negative for chills, fever and weight loss.  HENT: Negative.  Negative for nosebleeds.   Respiratory: Negative.  Negative for cough.   Cardiovascular: Positive for leg swelling. Negative for chest pain.  Gastrointestinal: Negative.  Negative for blood in stool and melena.  Genitourinary: Negative.  Negative for hematuria.  Musculoskeletal: Negative.   Skin: Negative.   Neurological: Negative.  Negative for weakness.  Endo/Heme/Allergies: Negative.   Psychiatric/Behavioral: Negative.     Past Medical History:  Diagnosis Date  . Alpha thalassemia trait 01/26/2010   02/2012: Nl CBC ex H&H-10.7/34.8, MCV-69   . Anemia   . Anxiety   . Anxiety and depression   . Depression   . Diabetes mellitus   . GERD (gastroesophageal reflux disease)   . Headache(784.0)   . Hyperlipidemia   . Hypertension   . Iron deficiency 01/21/2017  . Microcytic anemia 01/26/2010   02/2012: Nl CBC ex H&H-10.7/34.8, MCV-69   . Obesity   . Obstructive sleep apnea   . Osteoarthritis    Left knee; right shoulder; chronic neck and back pain  . Pruritus   . Seizures ([place])   . Tremor    This started months ago after her seizure progressing to very poor hand writing  . Urinary incontinence     Past Surgical History:  Procedure Laterality Date  . ABDOMINAL HYSTERECTOMY    . BREAST EXCISIONAL BIOPSY     Left; cyst  . CATARACT EXTRACTION W/ INTRAOCULAR LENS IMPLANT Left 09/07/2013  . CHOLECYSTECTOMY    . COLONOSCOPY      . COLONOSCOPY N/A 07/20/2015   Procedure: COLONOSCOPY;  Surgeon: Rogene Houston, MD;  Location: [facility];  Service: Endoscopy;  Laterality: N/A;  930  . EYE SURGERY Left 09/07/2013   cataract    Family History  Problem Relation Age of Onset  . Lung cancer Mother 23  . Kidney disease Father   . Diabetes Sister   . Keloids Brother   . ADD / ADHD Grandchild   . Bipolar disorder Grandchild   . Bipolar disorder Daughter   . Seizures Daughter   . Heart disease Daughter   . Kidney disease Son   . Neuropathy Son   . Kidney disease Son   . Edema Daughter   . Breast cancer Daughter 90  . Allergies Daughter   . Alcohol abuse Neg Hx   . Drug abuse Neg Hx     Social History   Socioeconomic History  . Marital status: Married    Spouse name: saunders  . Number of children: 5  . Years of education: 91  . Highest education level: Not on file  Social Needs  . Financial resource strain: Not on file  . Food insecurity - worry: Not on file  . Food insecurity - inability: Not on file  . Transportation needs - medical: Not on file  . Transportation needs - non-medical: Not on file  Occupational History  . Occupation: Disabled     Fish farm manager:  RETIRED  Tobacco Use  . Smoking status: Never Smoker  . Smokeless tobacco: Never Used  Substance and Sexual Activity  . Alcohol use: No    Alcohol/week: 0.0 oz  . Drug use: No  . Sexual activity: Yes    Birth control/protection: Surgical  Other Topics Concern  . Not on file  Social History Narrative   Patient lives at home with her husband [name].  Patient is retired.    Right handed.    Five Children.    Caffeine- 2 daily     PHYSICAL EXAMINATION  ECOG PERFORMANCE STATUS: 2 - Symptomatic, <50% confined to bed  There were no vitals filed for this visit. Physical Exam  Constitutional: She is oriented to person, place, and time and well-developed, well-nourished, and in no distress. No distress.  HENT:  Head: Normocephalic  and atraumatic.  Mouth/Throat: No oropharyngeal exudate.  Eyes: Conjunctivae are normal. Pupils are equal, round, and reactive to light. No scleral icterus.  Neck: Normal range of motion. Neck supple. No JVD present.  Cardiovascular: Normal rate, regular rhythm and normal heart sounds. Exam reveals no gallop and no friction rub.  No murmur heard. Pulmonary/Chest: Breath sounds normal. No respiratory distress. She has no wheezes. She has no rales.  Abdominal: Soft. Bowel sounds are normal. She exhibits no distension. There is no tenderness. There is no guarding.  Musculoskeletal: She exhibits edema (2+ pitting edema bilateral). She exhibits no tenderness.  Lymphadenopathy:    She has no cervical adenopathy.  Neurological: She is alert and oriented to person, place, and time. No cranial nerve deficit.  Skin: Skin is warm and dry. No rash noted. No erythema. No pallor.  Psychiatric: Affect and judgment normal.     LABORATORY DATA: CBC    Component Value Date/Time   WBC 6.1 08/19/2017 1712   RBC 4.99 08/19/2017 1712   HGB 10.4 (L) 08/19/2017 1712   HCT 34.7 (L) 08/19/2017 1712   PLT 167 08/19/2017 1712   MCV 69.5 (L) 08/19/2017 1712   MCH 20.8 (L) 08/19/2017 1712   MCHC 30.0 08/19/2017 1712   RDW 15.1 08/19/2017 1712   LYMPHSABS 2.2 03/22/2017 1236   MONOABS 0.3 03/22/2017 1236   EOSABS 0.3 03/22/2017 1236   BASOSABS 0.0 03/22/2017 1236      Chemistry      Component Value Date/Time   NA 138 08/19/2017 1712   K 4.5 08/19/2017 1712   CL 99 (L) 08/19/2017 1712   CO2 30 08/19/2017 1712   BUN 18 08/19/2017 1712   CREATININE 1.04 (H) 08/19/2017 1712   CREATININE 0.97 (H) 05/14/2017 1039      Component Value Date/Time   CALCIUM 9.6 08/19/2017 1712   ALKPHOS 104 08/19/2017 1712   AST 28 08/19/2017 1712   ALT 27 08/19/2017 1712   BILITOT 0.3 08/19/2017 1712     Lab Results  Component Value Date   IRON 70 03/22/2017   TIBC 330 03/22/2017   FERRITIN 141 03/22/2017   Lab  Results  Component Value Date   [id number] 947 02/21/2016   Lab Results  Component Value Date   FOLATE 16.8 05/25/2016         PENDING LABS:   RADIOGRAPHIC STUDIES:  No results found.   PATHOLOGY:    ASSESSMENT:  Microcytic anemia secondary to alpha thalassemia trait and chronic kidney disease.  Patients with thalassemia trait always have a microcytic anemia. She does not have any evidence of iron deficiency. Her hemoglobin has decreased a little from  10 g/dL 9.5 g/dL today. Patient's hemoglobin is chronically in the 9-10 g/dL range which has remained unchanged for the past few years. Continue to monitor patient is asymptomatic. Return to clinic in 6 months for follow-up with repeat labs.   All questions were answered. The patient knows to call the clinic with any problems, questions or concerns. We can certainly see the patient much sooner if necessary.  Patient and plan discussed with Dr. Twana First and she is in agreement with the aforementioned.   This note is electronically signed by: Jacquelin Hawking, NP 09/23/2017 8:30 AM

## 2017-09-25 DIAGNOSIS — M25562 Pain in left knee: Secondary | ICD-10-CM | POA: Diagnosis not present

## 2017-09-25 DIAGNOSIS — M6281 Muscle weakness (generalized): Secondary | ICD-10-CM | POA: Diagnosis not present

## 2017-09-25 DIAGNOSIS — R262 Difficulty in walking, not elsewhere classified: Secondary | ICD-10-CM | POA: Diagnosis not present

## 2017-09-25 DIAGNOSIS — M25662 Stiffness of left knee, not elsewhere classified: Secondary | ICD-10-CM | POA: Diagnosis not present

## 2017-09-26 DIAGNOSIS — M25562 Pain in left knee: Secondary | ICD-10-CM | POA: Diagnosis not present

## 2017-09-26 DIAGNOSIS — R262 Difficulty in walking, not elsewhere classified: Secondary | ICD-10-CM | POA: Diagnosis not present

## 2017-09-26 DIAGNOSIS — M25662 Stiffness of left knee, not elsewhere classified: Secondary | ICD-10-CM | POA: Diagnosis not present

## 2017-09-26 DIAGNOSIS — M6281 Muscle weakness (generalized): Secondary | ICD-10-CM | POA: Diagnosis not present

## 2017-09-30 DIAGNOSIS — M25662 Stiffness of left knee, not elsewhere classified: Secondary | ICD-10-CM | POA: Diagnosis not present

## 2017-09-30 DIAGNOSIS — R262 Difficulty in walking, not elsewhere classified: Secondary | ICD-10-CM | POA: Diagnosis not present

## 2017-09-30 DIAGNOSIS — M6281 Muscle weakness (generalized): Secondary | ICD-10-CM | POA: Diagnosis not present

## 2017-09-30 DIAGNOSIS — M25562 Pain in left knee: Secondary | ICD-10-CM | POA: Diagnosis not present

## 2017-10-01 ENCOUNTER — Other Ambulatory Visit: Payer: Self-pay | Admitting: Family Medicine

## 2017-10-02 DIAGNOSIS — M6281 Muscle weakness (generalized): Secondary | ICD-10-CM | POA: Diagnosis not present

## 2017-10-02 DIAGNOSIS — M25662 Stiffness of left knee, not elsewhere classified: Secondary | ICD-10-CM | POA: Diagnosis not present

## 2017-10-02 DIAGNOSIS — M25562 Pain in left knee: Secondary | ICD-10-CM | POA: Diagnosis not present

## 2017-10-02 DIAGNOSIS — R262 Difficulty in walking, not elsewhere classified: Secondary | ICD-10-CM | POA: Diagnosis not present

## 2017-10-03 DIAGNOSIS — M6281 Muscle weakness (generalized): Secondary | ICD-10-CM | POA: Diagnosis not present

## 2017-10-03 DIAGNOSIS — M25662 Stiffness of left knee, not elsewhere classified: Secondary | ICD-10-CM | POA: Diagnosis not present

## 2017-10-03 DIAGNOSIS — M25562 Pain in left knee: Secondary | ICD-10-CM | POA: Diagnosis not present

## 2017-10-03 DIAGNOSIS — R262 Difficulty in walking, not elsewhere classified: Secondary | ICD-10-CM | POA: Diagnosis not present

## 2017-10-08 DIAGNOSIS — M25562 Pain in left knee: Secondary | ICD-10-CM | POA: Diagnosis not present

## 2017-10-08 DIAGNOSIS — M6281 Muscle weakness (generalized): Secondary | ICD-10-CM | POA: Diagnosis not present

## 2017-10-08 DIAGNOSIS — M25662 Stiffness of left knee, not elsewhere classified: Secondary | ICD-10-CM | POA: Diagnosis not present

## 2017-10-08 DIAGNOSIS — R262 Difficulty in walking, not elsewhere classified: Secondary | ICD-10-CM | POA: Diagnosis not present

## 2017-10-10 ENCOUNTER — Ambulatory Visit (INDEPENDENT_AMBULATORY_CARE_PROVIDER_SITE_OTHER): Payer: Self-pay | Admitting: Otolaryngology

## 2017-10-10 DIAGNOSIS — M25562 Pain in left knee: Secondary | ICD-10-CM | POA: Diagnosis not present

## 2017-10-10 DIAGNOSIS — M6281 Muscle weakness (generalized): Secondary | ICD-10-CM | POA: Diagnosis not present

## 2017-10-10 DIAGNOSIS — M25662 Stiffness of left knee, not elsewhere classified: Secondary | ICD-10-CM | POA: Diagnosis not present

## 2017-10-10 DIAGNOSIS — R262 Difficulty in walking, not elsewhere classified: Secondary | ICD-10-CM | POA: Diagnosis not present

## 2017-10-11 DIAGNOSIS — M6281 Muscle weakness (generalized): Secondary | ICD-10-CM | POA: Diagnosis not present

## 2017-10-11 DIAGNOSIS — R262 Difficulty in walking, not elsewhere classified: Secondary | ICD-10-CM | POA: Diagnosis not present

## 2017-10-11 DIAGNOSIS — M25662 Stiffness of left knee, not elsewhere classified: Secondary | ICD-10-CM | POA: Diagnosis not present

## 2017-10-11 DIAGNOSIS — M25562 Pain in left knee: Secondary | ICD-10-CM | POA: Diagnosis not present

## 2017-10-14 ENCOUNTER — Ambulatory Visit (INDEPENDENT_AMBULATORY_CARE_PROVIDER_SITE_OTHER): Payer: Self-pay | Admitting: Otolaryngology

## 2017-10-14 DIAGNOSIS — M25562 Pain in left knee: Secondary | ICD-10-CM | POA: Diagnosis not present

## 2017-10-14 DIAGNOSIS — M6281 Muscle weakness (generalized): Secondary | ICD-10-CM | POA: Diagnosis not present

## 2017-10-14 DIAGNOSIS — M25662 Stiffness of left knee, not elsewhere classified: Secondary | ICD-10-CM | POA: Diagnosis not present

## 2017-10-14 DIAGNOSIS — R262 Difficulty in walking, not elsewhere classified: Secondary | ICD-10-CM | POA: Diagnosis not present

## 2017-10-15 DIAGNOSIS — R262 Difficulty in walking, not elsewhere classified: Secondary | ICD-10-CM | POA: Diagnosis not present

## 2017-10-15 DIAGNOSIS — M25562 Pain in left knee: Secondary | ICD-10-CM | POA: Diagnosis not present

## 2017-10-15 DIAGNOSIS — M25662 Stiffness of left knee, not elsewhere classified: Secondary | ICD-10-CM | POA: Diagnosis not present

## 2017-10-15 DIAGNOSIS — M6281 Muscle weakness (generalized): Secondary | ICD-10-CM | POA: Diagnosis not present

## 2017-10-17 ENCOUNTER — Encounter: Payer: Self-pay | Admitting: Family Medicine

## 2017-10-18 DIAGNOSIS — M6281 Muscle weakness (generalized): Secondary | ICD-10-CM | POA: Diagnosis not present

## 2017-10-18 DIAGNOSIS — M25662 Stiffness of left knee, not elsewhere classified: Secondary | ICD-10-CM | POA: Diagnosis not present

## 2017-10-18 DIAGNOSIS — R262 Difficulty in walking, not elsewhere classified: Secondary | ICD-10-CM | POA: Diagnosis not present

## 2017-10-18 DIAGNOSIS — M25562 Pain in left knee: Secondary | ICD-10-CM | POA: Diagnosis not present

## 2017-10-21 DIAGNOSIS — R262 Difficulty in walking, not elsewhere classified: Secondary | ICD-10-CM | POA: Diagnosis not present

## 2017-10-21 DIAGNOSIS — M6281 Muscle weakness (generalized): Secondary | ICD-10-CM | POA: Diagnosis not present

## 2017-10-21 DIAGNOSIS — M25662 Stiffness of left knee, not elsewhere classified: Secondary | ICD-10-CM | POA: Diagnosis not present

## 2017-10-21 DIAGNOSIS — M25562 Pain in left knee: Secondary | ICD-10-CM | POA: Diagnosis not present

## 2017-10-22 DIAGNOSIS — M25662 Stiffness of left knee, not elsewhere classified: Secondary | ICD-10-CM | POA: Diagnosis not present

## 2017-10-22 DIAGNOSIS — R262 Difficulty in walking, not elsewhere classified: Secondary | ICD-10-CM | POA: Diagnosis not present

## 2017-10-22 DIAGNOSIS — M6281 Muscle weakness (generalized): Secondary | ICD-10-CM | POA: Diagnosis not present

## 2017-10-22 DIAGNOSIS — M25562 Pain in left knee: Secondary | ICD-10-CM | POA: Diagnosis not present

## 2017-10-24 DIAGNOSIS — M25562 Pain in left knee: Secondary | ICD-10-CM | POA: Diagnosis not present

## 2017-10-24 DIAGNOSIS — M6281 Muscle weakness (generalized): Secondary | ICD-10-CM | POA: Diagnosis not present

## 2017-10-24 DIAGNOSIS — R262 Difficulty in walking, not elsewhere classified: Secondary | ICD-10-CM | POA: Diagnosis not present

## 2017-10-24 DIAGNOSIS — M25662 Stiffness of left knee, not elsewhere classified: Secondary | ICD-10-CM | POA: Diagnosis not present

## 2017-10-28 ENCOUNTER — Ambulatory Visit: Payer: PPO | Admitting: Family Medicine

## 2017-10-28 ENCOUNTER — Encounter: Payer: Self-pay | Admitting: Family Medicine

## 2017-10-28 ENCOUNTER — Other Ambulatory Visit: Payer: Self-pay

## 2017-10-28 VITALS — BP 166/58 | HR 88 | Temp 96.8°F | Resp 18 | Ht 68.0 in | Wt 288.1 lb

## 2017-10-28 DIAGNOSIS — R350 Frequency of micturition: Secondary | ICD-10-CM | POA: Diagnosis not present

## 2017-10-28 DIAGNOSIS — M6281 Muscle weakness (generalized): Secondary | ICD-10-CM | POA: Diagnosis not present

## 2017-10-28 DIAGNOSIS — J069 Acute upper respiratory infection, unspecified: Secondary | ICD-10-CM | POA: Diagnosis not present

## 2017-10-28 DIAGNOSIS — M25562 Pain in left knee: Secondary | ICD-10-CM | POA: Diagnosis not present

## 2017-10-28 DIAGNOSIS — M25662 Stiffness of left knee, not elsewhere classified: Secondary | ICD-10-CM | POA: Diagnosis not present

## 2017-10-28 DIAGNOSIS — R262 Difficulty in walking, not elsewhere classified: Secondary | ICD-10-CM | POA: Diagnosis not present

## 2017-10-28 LAB — POCT URINALYSIS DIPSTICK
Bilirubin, UA: NEGATIVE
Glucose, UA: 250
Ketones, UA: NEGATIVE
Nitrite, UA: NEGATIVE
Spec Grav, UA: 1.02 (ref 1.010–1.025)
Urobilinogen, UA: 1 E.U./dL
pH, UA: 5.5 (ref 5.0–8.0)

## 2017-10-28 MED ORDER — CIPROFLOXACIN HCL 500 MG PO TABS: 500.0000 mg | ORAL_TABLET | Freq: Two times a day (BID) | ORAL | 0 refills | Status: DC

## 2017-10-28 NOTE — Patient Instructions (Addendum)
Drink plenty of water Take the antibiotic as directed You may use Mucinex DM for the chest and head congestion  I am sending the urine for culture You are scheduled to see Dr Moshe Cipro in March

## 2017-10-28 NOTE — Progress Notes (Signed)
Chief Complaint  Patient presents with  . Urinary Tract Infection   Patient is here for an acute appointment. She called this morning requesting to be seen for urinary tract infection.  She has burning on urination and frequency.  She has a strong smell to her urine.  No blood in the no flank pain.  No abdominal pain.  No nausea or vomiting.  No fever or chills.  She feels like she has been prone to urinary tract infections.  I did look at her last culture that was performed November 2018, and it showed multiple flora consistent with contamination.  We will get a clean-catch urine specimen today. She also complains of a "cold".  She has sinus pressure and congestion, some postnasal drip, intermittent ear pain.  She also has pressure and congestion in the center of her chest.  Mild cough.  No sputum.  No chest pain.  No wheezing or shortness of breath.  No purulent sputum.  She is under the care of Dr. Benjamine Mola, but has not seen him for the last couple of visits.  Patient Active Problem List   Diagnosis Date Noted  . Cellulitis 05/09/2017  . Knee pain, left 02/11/2017  . Iron deficiency 01/21/2017  . Vitamin D deficiency 10/15/2016  . Urinary frequency 10/15/2016  . Limited mobility 05/27/2016  . Diabetes mellitus type 2 in obese ([place]) 02/04/2016  . Sleep terror disorder 12/14/2015  . Fecal incontinence 12/13/2015  . Back pain of lumbar region with sciatica 04/15/2015  . Shoulder pain, right 04/14/2015  . Spondylosis, cervical, with myelopathy 04/14/2015  . PVD (peripheral vascular disease) ([place]) 01/28/2014  . OSA (obstructive sleep apnea) 04/10/2013  . Peripheral neuropathy 03/12/2013  . UTI (urinary tract infection) 01/18/2013  . Hearing loss 11/06/2012  . UNSTEADY GAIT 11/07/2010  . Alpha thalassemia trait 01/26/2010  . NECK PAIN, CHRONIC 10/21/2008  . Hyperlipemia 04/21/2008  . Morbid obesity ([place]) 04/21/2008  . Anxiety and depression 04/21/2008  . Essential hypertension  04/21/2008  . GERD 04/21/2008  . Osteoarthritis of left knee 04/21/2008  . URINARY INCONTINENCE 04/21/2008    Outpatient Encounter Medications as of 10/28/2017  Medication Sig  . acetaminophen (EXTRA STRENGTH PAIN RELIEF) 500 MG tablet Take 500 mg by mouth every 6 (six) hours as needed for pain.  Marland Kitchen amLODipine (NORVASC) 10 MG tablet TAKE ONE TABLET BY MOUTH EVERY DAY  . aspirin 81 MG tablet Take 81 mg by mouth daily.    Marland Kitchen atorvastatin (LIPITOR) 40 MG tablet TAKE ONE TABLET BY MOUTH EVERY DAY  . benazepril (LOTENSIN) 20 MG tablet TAKE ONE TABLET BY MOUTH EVERY DAY  . Diclofenac Sodium (PENNSAID) 2 % SOLN Place 1 Squirt onto the skin 2 (two) times daily.  . diphenhydrAMINE (BENADRYL) 25 mg capsule Take 25 mg by mouth at bedtime. Reported on 10/18/2015  . gabapentin (NEURONTIN) 400 MG capsule TAKE ONE CAPSULE BY MOUTH EVERY MORNING and TAKE ONE CAPSULE AT NOON and TAKE TWO CAPSULES AT BEDTIME  . HYDROcodone-acetaminophen (NORCO/VICODIN) 5-325 MG tablet Take 1 tablet by mouth every 6 (six) hours as needed.  . insulin NPH-regular Human (NOVOLIN 70/30) (70-30) 100 UNIT/ML injection 60 units with breakfast and 15 units with supper, and syringes 2/day.  . Multiple Vitamin (MULTIVITAMIN) capsule Take 1 capsule by mouth daily.  . naproxen (NAPROSYN) 500 MG tablet TAKE 1 TABLET BY MOUTH TWICE DAILY WITH A MEAL  . omeprazole (PRILOSEC) 20 MG capsule TAKE ONE CAPSULE BY MOUTH EVERY DAY  . ondansetron (ZOFRAN)  4 MG tablet Take 1 tablet (4 mg total) by mouth daily as needed for nausea or vomiting.  . ONE TOUCH ULTRA TEST test strip USE TO TEST BLOOD SUGAR FOUR TIMES DAILY  . potassium chloride SA (K-DUR,KLOR-CON) 20 MEQ tablet TAKE ONE TABLET BY MOUTH TWICE DAILY  . sertraline (ZOLOFT) 100 MG tablet Take 1 tablet (100 mg total) by mouth 2 (two) times daily.  Marland Kitchen torsemide (DEMADEX) 20 MG tablet TAKE TWO TABLETS DAILY (STOP LASIX) (Patient taking differently: TAKE TWO TABLETS 20MG   DAILY (STOP LASIX))  .  traMADol (ULTRAM) 50 MG tablet Take 1 tablet (50 mg total) by mouth every 12 (twelve) hours as needed.  Marland Kitchen UNABLE TO FIND Walker x 1  DX unsteady gait, osteoarthritis of left knee  . UNABLE TO FIND Elevated Toliet Seat x 1 DX: unsteady gait, back pain, osteoarthritis of left knee  . UNABLE TO FIND Wheeled walker ( 4 wheel) with seat  Dx unsteady gait  . Vitamin D, Ergocalciferol, (DRISDOL) 50000 units CAPS capsule Take 1 capsule (50,000 Units total) by mouth every 7 (seven) days.  . ciprofloxacin (CIPRO) 500 MG tablet Take 1 tablet (500 mg total) by mouth 2 (two) times daily.   No facility-administered encounter medications on file as of 10/28/2017.     Allergies  Allergen Reactions  . Penicillins Shortness Of Breath, Itching and Rash  . Prednisone Shortness Of Breath, Itching and Rash  . Propoxyphene N-Acetaminophen Itching and Nausea And Vomiting    Review of Systems  Constitutional: Negative for activity change, appetite change, chills, diaphoresis and fever.  HENT: Positive for congestion, ear pain, postnasal drip and sinus pressure. Negative for sore throat.   Eyes: Negative for redness and visual disturbance.  Respiratory: Positive for cough and chest tightness. Negative for shortness of breath.   Cardiovascular: Negative for chest pain, palpitations and leg swelling.  Gastrointestinal: Negative for diarrhea, nausea and vomiting.  Genitourinary: Positive for dysuria, frequency and urgency. Negative for difficulty urinating and hematuria.  Musculoskeletal: Positive for arthralgias and back pain.       Chronic.  Uses walker  Neurological: Negative for dizziness and headaches.  Psychiatric/Behavioral: Positive for sleep disturbance. The patient is nervous/anxious.        Chronic.  Under the care of psychiatry   BP (!) 166/58 (BP Location: Left Arm, Patient Position: Sitting, Cuff Size: Large)   Pulse 88   Temp (!) 96.8 F (36 C) (Temporal)   Resp 18   Ht 5\' 8"  (1.727 m)   Wt  288 lb 1.9 oz (130.7 kg)   SpO2 98%   BMI 43.81 kg/m   Physical Exam  Constitutional: She is oriented to person, place, and time. She appears well-developed and well-nourished. No distress.  Obese  HENT:  Head: Normocephalic and atraumatic.  Right Ear: External ear normal.  Left Ear: External ear normal.  Nose: Nose normal.  Mouth/Throat: Oropharynx is clear and moist.  Dental malocclusion, underbite.  TMs and canals are clear.  Posterior pharynx is clear with no exudate.  No sinus tenderness.  Eyes: Conjunctivae are normal. Pupils are equal, round, and reactive to light.  Neck: Normal range of motion. No thyromegaly present.  Cardiovascular: Normal rate, regular rhythm and normal heart sounds.  Pulmonary/Chest: Effort normal and breath sounds normal. She has no wheezes.  Abdominal: Soft. Bowel sounds are normal. There is no tenderness.  No CVA tenderness  Lymphadenopathy:    She has no cervical adenopathy.  Neurological: She is alert and oriented  to person, place, and time.  Psychiatric: She has a normal mood and affect. Her behavior is normal. Thought content normal.   Results for orders placed or performed in visit on 10/28/17  POCT urinalysis dipstick  Result Value Ref Range   Color, UA yellow    Clarity, UA clear    Glucose, UA 250    Bilirubin, UA neg    Ketones, UA neg    Spec Grav, UA 1.020 1.010 - 1.025   Blood, UA trace    pH, UA 5.5 5.0 - 8.0   Protein, UA trace    Urobilinogen, UA 1.0 0.2 or 1.0 E.U./dL   Nitrite, UA neg    Leukocytes, UA Trace (A) Negative   Appearance     Odor      ASSESSMENT/PLAN:  1. Urinary frequency I told her that her urinalysis was consistent with light infection, but not diagnostic.  We are going to send a culture.  I am going to cover her with 3 days of Cipro. - POCT urinalysis dipstick - Urine Culture  2. Viral upper respiratory tract infection Rest.  Fluids.  Saline nasal sprays.  Mucinex DM.   Patient Instructions    Drink plenty of water Take the antibiotic as directed You may use Mucinex DM for the chest and head congestion  I am sending the urine for culture You are scheduled to see Dr Moshe Cipro in March      Raylene Everts, MD

## 2017-10-29 LAB — URINE CULTURE
MICRO NUMBER:: 90148478
SPECIMEN QUALITY:: ADEQUATE

## 2017-10-30 ENCOUNTER — Other Ambulatory Visit: Payer: Self-pay | Admitting: Family Medicine

## 2017-10-30 ENCOUNTER — Other Ambulatory Visit (HOSPITAL_COMMUNITY): Payer: Self-pay | Admitting: Psychiatry

## 2017-11-01 DIAGNOSIS — M6281 Muscle weakness (generalized): Secondary | ICD-10-CM | POA: Diagnosis not present

## 2017-11-01 DIAGNOSIS — M25662 Stiffness of left knee, not elsewhere classified: Secondary | ICD-10-CM | POA: Diagnosis not present

## 2017-11-01 DIAGNOSIS — R262 Difficulty in walking, not elsewhere classified: Secondary | ICD-10-CM | POA: Diagnosis not present

## 2017-11-01 DIAGNOSIS — M25562 Pain in left knee: Secondary | ICD-10-CM | POA: Diagnosis not present

## 2017-11-04 DIAGNOSIS — M25662 Stiffness of left knee, not elsewhere classified: Secondary | ICD-10-CM | POA: Diagnosis not present

## 2017-11-04 DIAGNOSIS — M25562 Pain in left knee: Secondary | ICD-10-CM | POA: Diagnosis not present

## 2017-11-04 DIAGNOSIS — M6281 Muscle weakness (generalized): Secondary | ICD-10-CM | POA: Diagnosis not present

## 2017-11-04 DIAGNOSIS — R262 Difficulty in walking, not elsewhere classified: Secondary | ICD-10-CM | POA: Diagnosis not present

## 2017-11-06 DIAGNOSIS — M25562 Pain in left knee: Secondary | ICD-10-CM | POA: Diagnosis not present

## 2017-11-06 DIAGNOSIS — M6281 Muscle weakness (generalized): Secondary | ICD-10-CM | POA: Diagnosis not present

## 2017-11-06 DIAGNOSIS — M25662 Stiffness of left knee, not elsewhere classified: Secondary | ICD-10-CM | POA: Diagnosis not present

## 2017-11-06 DIAGNOSIS — R262 Difficulty in walking, not elsewhere classified: Secondary | ICD-10-CM | POA: Diagnosis not present

## 2017-11-18 ENCOUNTER — Telehealth: Payer: Self-pay | Admitting: Family Medicine

## 2017-11-18 NOTE — Telephone Encounter (Signed)
Called spoke with Joaquim Lai, to advise that the Application for Dis Placard is available and at the front Desk

## 2017-11-18 NOTE — Telephone Encounter (Signed)
Spoke with  Patient to give the ok for daughter sharon to pickup her handicap placard.

## 2017-11-22 ENCOUNTER — Other Ambulatory Visit: Payer: Self-pay | Admitting: Endocrinology

## 2017-12-02 ENCOUNTER — Other Ambulatory Visit: Payer: Self-pay | Admitting: Family Medicine

## 2017-12-02 ENCOUNTER — Telehealth: Payer: Self-pay

## 2017-12-02 ENCOUNTER — Ambulatory Visit (HOSPITAL_COMMUNITY): Payer: Self-pay | Admitting: Psychiatry

## 2017-12-02 ENCOUNTER — Telehealth: Payer: Self-pay | Admitting: Endocrinology

## 2017-12-02 DIAGNOSIS — IMO0001 Reserved for inherently not codable concepts without codable children: Secondary | ICD-10-CM

## 2017-12-02 DIAGNOSIS — E119 Type 2 diabetes mellitus without complications: Principal | ICD-10-CM

## 2017-12-02 DIAGNOSIS — M79671 Pain in right foot: Secondary | ICD-10-CM

## 2017-12-02 DIAGNOSIS — E0849 Diabetes mellitus due to underlying condition with other diabetic neurological complication: Secondary | ICD-10-CM

## 2017-12-02 DIAGNOSIS — L97509 Non-pressure chronic ulcer of other part of unspecified foot with unspecified severity: Secondary | ICD-10-CM | POA: Insufficient documentation

## 2017-12-02 DIAGNOSIS — Z794 Long term (current) use of insulin: Principal | ICD-10-CM

## 2017-12-02 DIAGNOSIS — M79672 Pain in left foot: Secondary | ICD-10-CM

## 2017-12-02 NOTE — Telephone Encounter (Signed)
I don't see where I told pt I would refer her to a wound specialist.

## 2017-12-02 NOTE — Telephone Encounter (Signed)
Ok, I did referral 

## 2017-12-02 NOTE — Telephone Encounter (Signed)
Patient is upset because her PCP is on vacation & she can't get in to see someone about ulcer on her foot. She was wondering if you would refer her because ulcer is getting worse & bothering her. She has tried to see someone else in her PCP office unsuccessfully.

## 2017-12-02 NOTE — Progress Notes (Signed)
amb podiatry

## 2017-12-02 NOTE — Telephone Encounter (Signed)
Patient stated dr referred her over to see a dr about her Ulcer on her foot.  She states she went an seen Dr Phyllis Ginger who referred her over to see a dr for this.  She states she can not see Dr Pilar Plate  because of insurance. That he was not in network,. She would like to see if there is someone else who is in network .  Please advise

## 2017-12-02 NOTE — Telephone Encounter (Signed)
pls letpt know she will be referred to a podiatrist in [place] once we are able to find one who takes her insurance, I have entered the referral and discussed this with referral saff

## 2017-12-02 NOTE — Telephone Encounter (Signed)
I do not see where you placed a referral for this patient? Please advise?

## 2017-12-02 NOTE — Telephone Encounter (Signed)
Please advise 

## 2017-12-02 NOTE — Telephone Encounter (Signed)
Pt still having problems with her diabetic foot.  Do you know of anywhere she could go in [place] that takes care of this?  I suggested Dr Sharol Given at [facility] because he treats a lot of diabetic foot issues.  She said she does not have a way to [place].

## 2017-12-03 NOTE — Telephone Encounter (Signed)
Looked in patient chart, endocrinology sent in wound referral for her ulcer on her foot.  Podiatry referral sent in by [facility] was closed out.

## 2017-12-03 NOTE — Telephone Encounter (Signed)
I called and notified patient that referral was sent in.

## 2017-12-10 ENCOUNTER — Ambulatory Visit: Payer: PPO | Admitting: Endocrinology

## 2017-12-10 ENCOUNTER — Encounter: Payer: Self-pay | Admitting: Endocrinology

## 2017-12-10 VITALS — BP 150/62 | HR 78 | Wt 286.6 lb

## 2017-12-10 DIAGNOSIS — E1169 Type 2 diabetes mellitus with other specified complication: Secondary | ICD-10-CM

## 2017-12-10 DIAGNOSIS — E669 Obesity, unspecified: Secondary | ICD-10-CM | POA: Diagnosis not present

## 2017-12-10 LAB — HEMOGLOBIN A1C: Hgb A1c MFr Bld: 8.5 % — ABNORMAL HIGH (ref 4.6–6.5)

## 2017-12-10 MED ORDER — INSULIN NPH ISOPHANE & REGULAR (70-30) 100 UNIT/ML ~~LOC~~ SUSP: 80.0000 [IU] | Freq: Every day | SUBCUTANEOUS | 11 refills | Status: DC

## 2017-12-10 NOTE — Patient Instructions (Addendum)
Your blood pressure is high today.  Please see your primary care provider soon, to have it rechecked.   A diabetes blood test is requested for you today.  We'll let you know about the results.   You can skip the wound care appt.  Just keep an eye on your right heel, to make sure an ulcer does not recur.   check your blood sugar twice a day.  vary the time of day when you check, between before the 3 meals, and at bedtime.  also check if you have symptoms of your blood sugar being too high or too low.  please keep a record of the readings and bring it to your next appointment here (or you can bring the meter itself).  You can write it on any piece of paper.  please call us sooner if your blood sugar goes below 70, or if you have a lot of readings over 200. Please come back for a follow-up appointment in 4 months.

## 2017-12-10 NOTE — Progress Notes (Signed)
Subjective:    Patient ID: [place] Atkins, female    DOB: 22-Mar-1941, 77 y.o.   MRN: [id number]  HPI Pt returns for f/u of diabetes mellitus: DM type: insulin-requiring type 2.   Dx'ed: 3419 Complications: polyneuropathy, PAD, and foot ulcer.   Therapy: insulin since 2005.   GDM: never.  DKA: never Severe hypoglycemia: never.  Pancreatitis: never.   Other info: in early 2016, she changed to BID premixed insulin, due to poor results with multiple daily injections.  She takes human insulin, due to cost.   Interval history: Pt called, and said she had an ulcer at the right heel.  I ref pt to wound care.  no cbg record, but states cbg's are seldom low, and these episodes are mild, and happen in the early hrs of the morning.   Past Medical History:  Diagnosis Date  . Alpha thalassemia trait 01/26/2010   02/2012: Nl CBC ex H&H-10.7/34.8, MCV-69   . Anemia   . Anxiety   . Anxiety and depression   . Depression   . Diabetes mellitus   . GERD (gastroesophageal reflux disease)   . Headache(784.0)   . Hyperlipidemia   . Hypertension   . Iron deficiency 01/21/2017  . Microcytic anemia 01/26/2010   02/2012: Nl CBC ex H&H-10.7/34.8, MCV-69   . Obesity   . Obstructive sleep apnea   . Osteoarthritis    Left knee; right shoulder; chronic neck and back pain  . Pruritus   . Seizures ([place])   . Tremor    This started months ago after her seizure progressing to very poor hand writing  . Urinary incontinence     Past Surgical History:  Procedure Laterality Date  . ABDOMINAL HYSTERECTOMY    . BREAST EXCISIONAL BIOPSY     Left; cyst  . CATARACT EXTRACTION W/ INTRAOCULAR LENS IMPLANT Left 09/07/2013  . CHOLECYSTECTOMY    . COLONOSCOPY    . COLONOSCOPY N/A 07/20/2015   Procedure: COLONOSCOPY;  Surgeon: Rogene Houston, MD;  Location: [facility];  Service: Endoscopy;  Laterality: N/A;  930  . EYE SURGERY Left 09/07/2013   cataract    Social History   Socioeconomic History  . Marital  status: Married    Spouse name: saunders  . Number of children: 5  . Years of education: 50  . Highest education level: Not on file  Social Needs  . Financial resource strain: Not on file  . Food insecurity - worry: Not on file  . Food insecurity - inability: Not on file  . Transportation needs - medical: Not on file  . Transportation needs - non-medical: Not on file  Occupational History  . Occupation: Disabled     Employer: RETIRED  Tobacco Use  . Smoking status: Never Smoker  . Smokeless tobacco: Never Used  Substance and Sexual Activity  . Alcohol use: No    Alcohol/week: 0.0 oz  . Drug use: No  . Sexual activity: Yes    Birth control/protection: Surgical  Other Topics Concern  . Not on file  Social History Narrative   Patient lives at home with her husband [name].  Patient is retired.    Right handed.    Five Children.    Caffeine- 2 daily    Current Outpatient Medications on File Prior to Visit  Medication Sig Dispense Refill  . acetaminophen (EXTRA STRENGTH PAIN RELIEF) 500 MG tablet Take 500 mg by mouth every 6 (six) hours as needed for pain.    Marland Kitchen  amLODipine (NORVASC) 10 MG tablet TAKE ONE TABLET BY MOUTH EVERY DAY 90 tablet 1  . aspirin 81 MG tablet Take 81 mg by mouth daily.      Marland Kitchen atorvastatin (LIPITOR) 40 MG tablet TAKE ONE TABLET BY MOUTH EVERY DAY 90 tablet 1  . benazepril (LOTENSIN) 20 MG tablet TAKE ONE TABLET BY MOUTH EVERY DAY 90 tablet 1  . diphenhydrAMINE (BENADRYL) 25 mg capsule Take 25 mg by mouth at bedtime. Reported on 10/18/2015    . Multiple Vitamin (MULTIVITAMIN) capsule Take 1 capsule by mouth daily.    Marland Kitchen omeprazole (PRILOSEC) 20 MG capsule TAKE ONE CAPSULE BY MOUTH EVERY DAY 90 capsule 1  . ONE TOUCH ULTRA TEST test strip USE TO TEST BLOOD SUGAR FOUR TIMES DAILY 360 each 3  . potassium chloride SA (K-DUR,KLOR-CON) 20 MEQ tablet TAKE ONE TABLET BY MOUTH TWICE DAILY 180 tablet 1  . sertraline (ZOLOFT) 100 MG tablet TAKE ONE TABLET BY MOUTH  TWICE DAILY 60 tablet 5  . torsemide (DEMADEX) 20 MG tablet TAKE TWO TABLETS DAILY (STOP LASIX) (Patient taking differently: TAKE TWO TABLETS 20MG   DAILY (STOP LASIX)) 180 tablet 3  . UNABLE TO FIND Elevated Toliet Seat x 1 DX: unsteady gait, back pain, osteoarthritis of left knee 1 each 0  . UNABLE TO FIND Wheeled walker ( 4 wheel) with seat  Dx unsteady gait 1 each 0  . Vitamin D, Ergocalciferol, (DRISDOL) 50000 units CAPS capsule Take 1 capsule (50,000 Units total) by mouth every 7 (seven) days. 4 capsule 5  . ciprofloxacin (CIPRO) 500 MG tablet Take 1 tablet (500 mg total) by mouth 2 (two) times daily. (Patient not taking: Reported on 12/10/2017) 6 tablet 0  . Diclofenac Sodium (PENNSAID) 2 % SOLN Place 1 Squirt onto the skin 2 (two) times daily. (Patient not taking: Reported on 12/10/2017) 1 Bottle 1  . gabapentin (NEURONTIN) 400 MG capsule TAKE ONE CAPSULE BY MOUTH EVERY MORNING and TAKE ONE CAPSULE AT NOON and TAKE TWO CAPSULES AT BEDTIME 360 capsule 1  . HYDROcodone-acetaminophen (NORCO/VICODIN) 5-325 MG tablet Take 1 tablet by mouth every 6 (six) hours as needed.  0  . naproxen (NAPROSYN) 500 MG tablet TAKE 1 TABLET BY MOUTH TWICE DAILY WITH A MEAL (Patient not taking: Reported on 12/10/2017) 30 tablet 0  . ondansetron (ZOFRAN) 4 MG tablet Take 1 tablet (4 mg total) by mouth daily as needed for nausea or vomiting. (Patient not taking: Reported on 12/10/2017) 15 tablet 0  . traMADol (ULTRAM) 50 MG tablet Take 1 tablet (50 mg total) by mouth every 12 (twelve) hours as needed. (Patient not taking: Reported on 12/10/2017) 40 tablet 0  . UNABLE TO FIND Walker x 1  DX unsteady gait, osteoarthritis of left knee 1 each 0   No current facility-administered medications on file prior to visit.     Allergies  Allergen Reactions  . Penicillins Shortness Of Breath, Itching and Rash  . Prednisone Shortness Of Breath, Itching and Rash  . Propoxyphene N-Acetaminophen Itching and Nausea And Vomiting     Family History  Problem Relation Age of Onset  . Lung cancer Mother 26  . Kidney disease Father   . Diabetes Sister   . Keloids Brother   . ADD / ADHD Grandchild   . Bipolar disorder Grandchild   . Bipolar disorder Daughter   . Seizures Daughter   . Heart disease Daughter   . Kidney disease Son   . Neuropathy Son   . Kidney disease Son   .  Edema Daughter   . Breast cancer Daughter 17  . Allergies Daughter   . Alcohol abuse Neg Hx   . Drug abuse Neg Hx     BP (!) 150/62 (BP Location: Left Arm, Patient Position: Sitting, Cuff Size: Normal)   Pulse 78   Wt 286 lb 9.6 oz (130 kg)   SpO2 98%   BMI 43.58 kg/m   Review of Systems Denies LOC    Objective:   Physical Exam VITAL SIGNS:  See vs page GENERAL: no distress.  Morbid obesity.  Pulses: DP pulses are present bilaterally.  MSK: no deformity of the feet or ankles.  CV: 1+ bilat edema of the legs.  Skin:  no ulcer on the feet or ankles. normal color and temp on the feet and ankles.  Neuro: sensation is intact to touch on the feet and ankles.   DDU:KGURK is bilateral onychomycosis of the toenails.    Lab Results  Component Value Date   HGBA1C 8.5 (H) 12/10/2017      Assessment & Plan:  Insulin-requiring type 2 DM, with PAD: worse.  Ulcer, healed.  I offered to ref pt to podiatry, but she says podiatrist told her that there was nothing further that could be done.    Patient Instructions  Your blood pressure is high today.  Please see your primary care provider soon, to have it rechecked.   A diabetes blood test is requested for you today.  We'll let you know about the results.   You can skip the wound care appt.  Just keep an eye on your right heel, to make sure an ulcer does not recur.   check your blood sugar twice a day.  vary the time of day when you check, between before the 3 meals, and at bedtime.  also check if you have symptoms of your blood sugar being too high or too low.  please keep a record of the  readings and bring it to your next appointment here (or you can bring the meter itself).  You can write it on any piece of paper.  please call us sooner if your blood sugar goes below 70, or if you have a lot of readings over 200. Please come back for a follow-up appointment in 4 months.

## 2017-12-19 ENCOUNTER — Ambulatory Visit: Payer: Self-pay | Admitting: Family Medicine

## 2017-12-19 DIAGNOSIS — H26492 Other secondary cataract, left eye: Secondary | ICD-10-CM | POA: Diagnosis not present

## 2017-12-19 DIAGNOSIS — H25811 Combined forms of age-related cataract, right eye: Secondary | ICD-10-CM | POA: Diagnosis not present

## 2017-12-19 DIAGNOSIS — E113553 Type 2 diabetes mellitus with stable proliferative diabetic retinopathy, bilateral: Secondary | ICD-10-CM | POA: Diagnosis not present

## 2017-12-19 DIAGNOSIS — Z961 Presence of intraocular lens: Secondary | ICD-10-CM | POA: Diagnosis not present

## 2017-12-20 ENCOUNTER — Ambulatory Visit (HOSPITAL_COMMUNITY): Payer: PPO | Admitting: Psychiatry

## 2017-12-20 ENCOUNTER — Encounter (HOSPITAL_COMMUNITY): Payer: Self-pay | Admitting: Psychiatry

## 2017-12-20 VITALS — BP 152/82 | HR 76 | Ht 68.0 in | Wt 285.0 lb

## 2017-12-20 DIAGNOSIS — M549 Dorsalgia, unspecified: Secondary | ICD-10-CM

## 2017-12-20 DIAGNOSIS — Z91410 Personal history of adult physical and sexual abuse: Secondary | ICD-10-CM

## 2017-12-20 DIAGNOSIS — M791 Myalgia, unspecified site: Secondary | ICD-10-CM | POA: Diagnosis not present

## 2017-12-20 DIAGNOSIS — M2550 Pain in unspecified joint: Secondary | ICD-10-CM | POA: Diagnosis not present

## 2017-12-20 DIAGNOSIS — Z818 Family history of other mental and behavioral disorders: Secondary | ICD-10-CM

## 2017-12-20 DIAGNOSIS — F329 Major depressive disorder, single episode, unspecified: Secondary | ICD-10-CM

## 2017-12-20 DIAGNOSIS — F32A Depression, unspecified: Secondary | ICD-10-CM

## 2017-12-20 MED ORDER — SERTRALINE HCL 100 MG PO TABS: 100.0000 mg | ORAL_TABLET | Freq: Two times a day (BID) | ORAL | 5 refills | Status: DC
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
## 2017-12-24 DIAGNOSIS — H2511 Age-related nuclear cataract, right eye: Secondary | ICD-10-CM | POA: Diagnosis not present

## 2017-12-30 DIAGNOSIS — H21561 Pupillary abnormality, right eye: Secondary | ICD-10-CM | POA: Diagnosis not present

## 2017-12-30 DIAGNOSIS — H5703 Miosis: Secondary | ICD-10-CM | POA: Diagnosis not present

## 2017-12-30 DIAGNOSIS — H2511 Age-related nuclear cataract, right eye: Secondary | ICD-10-CM | POA: Diagnosis not present

## 2017-12-30 DIAGNOSIS — H25811 Combined forms of age-related cataract, right eye: Secondary | ICD-10-CM | POA: Diagnosis not present

## 2018-01-01 ENCOUNTER — Ambulatory Visit (HOSPITAL_COMMUNITY): Payer: PPO | Admitting: Psychiatry

## 2018-01-02 ENCOUNTER — Encounter: Payer: Self-pay | Admitting: Family Medicine

## 2018-01-02 ENCOUNTER — Ambulatory Visit: Payer: PPO | Admitting: Family Medicine

## 2018-01-02 ENCOUNTER — Other Ambulatory Visit: Payer: Self-pay

## 2018-01-02 VITALS — BP 136/64 | HR 73 | Temp 98.1°F | Resp 12 | Ht 68.0 in | Wt 285.1 lb

## 2018-01-02 DIAGNOSIS — R5383 Other fatigue: Secondary | ICD-10-CM

## 2018-01-02 DIAGNOSIS — R0789 Other chest pain: Secondary | ICD-10-CM

## 2018-01-02 DIAGNOSIS — M2550 Pain in unspecified joint: Secondary | ICD-10-CM

## 2018-01-02 MED ORDER — TRAMADOL HCL 50 MG PO TABS: 50.0000 mg | ORAL_TABLET | Freq: Three times a day (TID) | ORAL | 0 refills | Status: DC | PRN

## 2018-01-02 MED ORDER — DICLOFENAC SODIUM 75 MG PO TBEC: 75.0000 mg | DELAYED_RELEASE_TABLET | Freq: Two times a day (BID) | ORAL | 0 refills | Status: DC

## 2018-01-02 NOTE — Progress Notes (Signed)
Chief Complaint  Patient presents with  . Leg Pain  . joint pain  . feels like something is sitting on chest   Usual PCP is Tula Nakayama, MD I am seeing for a sick visit For the last 3 days or so has had an increase in fatigue and body pain Pain is across shoulders and in all joints Pain also in the anterior chest , feels like someone is sitting on my chest"  It is not exertional.  It does not radiate or cause diaphoresis.  No palpitations or skipped beats.  No his of heart or lung disease.No recent cough.  No falls. No fever or chills. Husband complains that she is very inactive at home.  Spends most of her time in the bed or a chair. He thinks it is motivation.  She says it is due to pain.  We discussed that if she doesn't exercise she will get weaker and more de-conditioned.  This will worsen her pain and her health overall.  Patient Active Problem List   Diagnosis Date Noted  . Foot ulcer ([place]) 12/02/2017  . Knee pain, left 02/11/2017  . Iron deficiency 01/21/2017  . Vitamin D deficiency 10/15/2016  . Urinary frequency 10/15/2016  . Limited mobility 05/27/2016  . Diabetes mellitus type 2 in obese ([place]) 02/04/2016  . Sleep terror disorder 12/14/2015  . Fecal incontinence 12/13/2015  . Back pain of lumbar region with sciatica 04/15/2015  . Shoulder pain, right 04/14/2015  . Spondylosis, cervical, with myelopathy 04/14/2015  . PVD (peripheral vascular disease) ([place]) 01/28/2014  . OSA (obstructive sleep apnea) 04/10/2013  . Peripheral neuropathy 03/12/2013  . Hearing loss 11/06/2012  . UNSTEADY GAIT 11/07/2010  . Alpha thalassemia trait 01/26/2010  . NECK PAIN, CHRONIC 10/21/2008  . Hyperlipemia 04/21/2008  . Morbid obesity ([place]) 04/21/2008  . Anxiety and depression 04/21/2008  . Essential hypertension 04/21/2008  . GERD 04/21/2008  . Osteoarthritis of left knee 04/21/2008  . URINARY INCONTINENCE 04/21/2008    Outpatient Encounter Medications as of 01/02/2018    Medication Sig  . acetaminophen (EXTRA STRENGTH PAIN RELIEF) 500 MG tablet Take 500 mg by mouth every 6 (six) hours as needed for pain.  Marland Kitchen amLODipine (NORVASC) 10 MG tablet TAKE ONE TABLET BY MOUTH EVERY DAY  . aspirin 81 MG tablet Take 81 mg by mouth daily.    Marland Kitchen atorvastatin (LIPITOR) 40 MG tablet TAKE ONE TABLET BY MOUTH EVERY DAY  . benazepril (LOTENSIN) 20 MG tablet TAKE ONE TABLET BY MOUTH EVERY DAY  . diclofenac (VOLTAREN) 75 MG EC tablet Take 1 tablet (75 mg total) by mouth 2 (two) times daily.  . Diclofenac Sodium (PENNSAID) 2 % SOLN Place 1 Squirt onto the skin 2 (two) times daily.  . diphenhydrAMINE (BENADRYL) 25 mg capsule Take 25 mg by mouth at bedtime. Reported on 10/18/2015  . gabapentin (NEURONTIN) 400 MG capsule TAKE ONE CAPSULE BY MOUTH EVERY MORNING and TAKE ONE CAPSULE AT NOON and TAKE TWO CAPSULES AT BEDTIME  . insulin NPH-regular Human (NOVOLIN 70/30) (70-30) 100 UNIT/ML injection Inject 80 Units into the skin daily with breakfast. and syringes 2/day.  . Multiple Vitamin (MULTIVITAMIN) capsule Take 1 capsule by mouth daily.  Marland Kitchen omeprazole (PRILOSEC) 20 MG capsule TAKE ONE CAPSULE BY MOUTH EVERY DAY  . ONE TOUCH ULTRA TEST test strip USE TO TEST BLOOD SUGAR FOUR TIMES DAILY  . potassium chloride SA (K-DUR,KLOR-CON) 20 MEQ tablet TAKE ONE TABLET BY MOUTH TWICE DAILY  . sertraline (ZOLOFT) 100  MG tablet Take 1 tablet (100 mg total) by mouth 2 (two) times daily.  Marland Kitchen torsemide (DEMADEX) 20 MG tablet TAKE TWO TABLETS DAILY (STOP LASIX) (Patient taking differently: TAKE TWO TABLETS 20MG  DAILY (STOP LASIX))  . traMADol (ULTRAM) 50 MG tablet Take 1 tablet (50 mg total) by mouth every 8 (eight) hours as needed.  Marland Kitchen UNABLE TO FIND Walker x 1  DX unsteady gait, osteoarthritis of left knee  . UNABLE TO FIND Elevated Toliet Seat x 1 DX: unsteady gait, back pain, osteoarthritis of left knee  . Vitamin D, Ergocalciferol, (DRISDOL) 50000 units CAPS capsule Take 1 capsule (50,000 Units total)  by mouth every 7 (seven) days.   No facility-administered encounter medications on file as of 01/02/2018.     Allergies  Allergen Reactions  . Penicillins Shortness Of Breath, Itching and Rash  . Prednisone Shortness Of Breath, Itching and Rash  . Propoxyphene N-Acetaminophen Itching and Nausea And Vomiting    Review of Systems  Constitutional: Positive for fatigue. Negative for activity change, appetite change, chills, diaphoresis, fever and unexpected weight change.  HENT: Negative for congestion, dental problem, postnasal drip and rhinorrhea.   Eyes: Negative for redness and visual disturbance.  Respiratory: Positive for chest tightness. Negative for cough and shortness of breath.   Cardiovascular: Positive for chest pain. Negative for palpitations and leg swelling.  Gastrointestinal: Negative for abdominal pain, constipation, diarrhea, nausea and vomiting.  Genitourinary: Negative for difficulty urinating and frequency.  Musculoskeletal: Positive for arthralgias, myalgias, neck pain and neck stiffness. Negative for back pain and joint swelling.  Skin: Negative for rash.  Neurological: Negative for dizziness and headaches.  Psychiatric/Behavioral: Positive for dysphoric mood. Negative for sleep disturbance. The patient is not nervous/anxious.        Discouraged    Physical Exam  Constitutional: She is oriented to person, place, and time. She appears well-developed and well-nourished.  Obese.  Appears tired  HENT:  Head: Normocephalic and atraumatic.  Mouth/Throat: Oropharynx is clear and moist.  dentures  Eyes: Pupils are equal, round, and reactive to light. Conjunctivae are normal.  Neck: Normal range of motion. Neck supple. No thyromegaly present.  Cardiovascular: Normal rate, regular rhythm and normal heart sounds.  Pulmonary/Chest: Effort normal and breath sounds normal. No respiratory distress. She has no wheezes. She has no rales.  Tender to pressure on sternum    Musculoskeletal: Normal range of motion. She exhibits edema.  Joints examined, no synovitis or warmth. Tender across upper back muscles  Lymphadenopathy:    She has no cervical adenopathy.  Neurological: She is alert and oriented to person, place, and time.  Gait normal  Skin: Skin is warm and dry.  Psychiatric: Her behavior is normal. Thought content normal.  Seems sad/discouraged at health issues  Nursing note and vitals reviewed.   BP 136/64   Pulse 73   Temp 98.1 F (36.7 C) (Oral)   Resp 12   Ht 5' 8"  (1.727 m)   Wt 285 lb 1.9 oz (129.3 kg)   SpO2 98%   BMI 43.35 kg/m     ASSESSMENT/PLAN:  1. Arthralgia, unspecified joint Discussed possible PMR or inflammatory arthritis.  May use NSAIDs in a limited-not chronic fashion with her DM.  Confirmed that GFR is 60. - Sed Rate (ESR) - CBC with Differential/Platelet - Rheumatoid Factor  2. Other fatigue   3. Anterior chest wall pain    Patient Instructions  Take Voltaren (diclofenac) twice a day with food This is an anti-inflammatory,  arthritis pain medicine Let me know if it causes any stomach upset  Take the tramadol (Ultram) every 6 hours as needed for severe pain  Lab work today We will call you tomorrow with your test results   Raylene Everts, MD

## 2018-01-02 NOTE — Patient Instructions (Signed)
Take Voltaren (diclofenac) twice a day with food This is an anti-inflammatory, arthritis pain medicine Let me know if it causes any stomach upset  Take the tramadol (Ultram) every 6 hours as needed for severe pain  Lab work today We will call you tomorrow with your test results

## 2018-01-04 LAB — CBC WITH DIFFERENTIAL/PLATELET
Basophils Absolute: 31 cells/uL (ref 0–200)
Basophils Relative: 0.6 %
Eosinophils Absolute: 260 cells/uL (ref 15–500)
Eosinophils Relative: 5 %
HCT: 31.9 % — ABNORMAL LOW (ref 35.0–45.0)
Hemoglobin: 9.8 g/dL — ABNORMAL LOW (ref 11.7–15.5)
Lymphs Abs: 2350 cells/uL (ref 850–3900)
MCH: 20.9 pg — ABNORMAL LOW (ref 27.0–33.0)
MCHC: 30.7 g/dL — ABNORMAL LOW (ref 32.0–36.0)
MCV: 68 fL — ABNORMAL LOW (ref 80.0–100.0)
Monocytes Relative: 6.5 %
Neutro Abs: 2220 cells/uL (ref 1500–7800)
Neutrophils Relative %: 42.7 %
Platelets: 204 10*3/uL (ref 140–400)
RBC: 4.69 10*6/uL (ref 3.80–5.10)
RDW: 14.6 % (ref 11.0–15.0)
Total Lymphocyte: 45.2 %
WBC mixed population: 338 cells/uL (ref 200–950)
WBC: 5.2 10*3/uL (ref 3.8–10.8)

## 2018-01-04 LAB — RHEUMATOID FACTOR: Rhuematoid fact SerPl-aCnc: <14 IU/mL (ref <14)

## 2018-01-04 LAB — SEDIMENTATION RATE: Sed Rate: 28 mm/h (ref 0–30)

## 2018-01-07 ENCOUNTER — Encounter: Payer: Self-pay | Admitting: Family Medicine

## 2018-01-09 DIAGNOSIS — E113552 Type 2 diabetes mellitus with stable proliferative diabetic retinopathy, left eye: Secondary | ICD-10-CM | POA: Diagnosis not present

## 2018-01-09 DIAGNOSIS — E113591 Type 2 diabetes mellitus with proliferative diabetic retinopathy without macular edema, right eye: Secondary | ICD-10-CM | POA: Diagnosis not present

## 2018-01-16 ENCOUNTER — Telehealth: Payer: Self-pay

## 2018-01-16 NOTE — Telephone Encounter (Signed)
Returned patient's call left on vm regarding lab results. Patient stated she received letter explaining labs after leaving vm, but we discussed results with verbal understanding.

## 2018-02-04 ENCOUNTER — Ambulatory Visit: Payer: PPO | Admitting: Diagnostic Neuroimaging

## 2018-02-04 ENCOUNTER — Encounter: Payer: Self-pay | Admitting: Diagnostic Neuroimaging

## 2018-02-04 VITALS — BP 154/65 | HR 74 | Ht 68.0 in | Wt 285.0 lb

## 2018-02-04 DIAGNOSIS — E1142 Type 2 diabetes mellitus with diabetic polyneuropathy: Secondary | ICD-10-CM

## 2018-02-04 NOTE — Progress Notes (Signed)
[facility]  PATIENT: Laura Atkins DOB: 1941/01/04  REFERRING CLINICIAN: Threasa Heads, MD HISTORY FROM: patient and husband REASON FOR VISIT: new consult    HISTORICAL  CHIEF COMPLAINT:  Chief Complaint  Patient presents with  . NP Dr. Caprice Beaver  Neuropathy    Rm 7, husband, [name] with pt. She uses walker.  Neuropathy numbness, no pain she stated. Has seen Dr. Krista Blue in past along with Dr. Rexene Alberts.     HISTORY OF PRESENT ILLNESS:   77 year old female here for evaluation of numbness, tingling, gait and balance difficulty.  Patient has history of hypertension, diabetes, obesity.  Patient has had gradual onset and progressive gait and balance difficulty.  Symptoms particularly worse starting November 2018.  Patient developed a ulcer in the right heel, possibly related to complications of diabetes.  Patient was evaluated by podiatry and then referred to neurology.  Patient saw neurologist Dr. Merlene Laughter, who apparently diagnosed diabetic neuropathy.  Patient was tried on gabapentin.  Patient then requested second opinion neurology evaluation.  Recent hemoglobin A1c was 8.5.  Patient now using walker and wheelchair.  Patient also has lower extremity numbness, tingling, pain.  Also has low back pain.    REVIEW OF SYSTEMS: Full 14 system review of systems performed and negative with exception of: As per HPI.  ALLERGIES: Allergies  Allergen Reactions  . Penicillins Shortness Of Breath, Itching and Rash  . Prednisone Shortness Of Breath, Itching and Rash  . Propoxyphene N-Acetaminophen Itching and Nausea And Vomiting    HOME MEDICATIONS: Outpatient Medications Prior to Visit  Medication Sig Dispense Refill  . acetaminophen (EXTRA STRENGTH PAIN RELIEF) 500 MG tablet Take 500 mg by mouth every 6 (six) hours as needed for pain.    Marland Kitchen amLODipine (NORVASC) 10 MG tablet TAKE ONE TABLET BY MOUTH EVERY DAY 90 tablet 1  . aspirin 81 MG tablet Take 81 mg by mouth daily.        Marland Kitchen atorvastatin (LIPITOR) 40 MG tablet TAKE ONE TABLET BY MOUTH EVERY DAY 90 tablet 1  . benazepril (LOTENSIN) 20 MG tablet TAKE ONE TABLET BY MOUTH EVERY DAY 90 tablet 1  . diclofenac (VOLTAREN) 75 MG EC tablet Take 1 tablet (75 mg total) by mouth 2 (two) times daily. 30 tablet 0  . Diclofenac Sodium (PENNSAID) 2 % SOLN Place 1 Squirt onto the skin 2 (two) times daily. 1 Bottle 1  . diphenhydrAMINE (BENADRYL) 25 mg capsule Take 25 mg by mouth at bedtime. Reported on 10/18/2015    . gabapentin (NEURONTIN) 400 MG capsule TAKE ONE CAPSULE BY MOUTH EVERY MORNING and TAKE ONE CAPSULE AT NOON and TAKE TWO CAPSULES AT BEDTIME 360 capsule 1  . insulin NPH-regular Human (NOVOLIN 70/30) (70-30) 100 UNIT/ML injection Inject 80 Units into the skin daily with breakfast. and syringes 2/day. 30 mL 11  . ketorolac (ACULAR) 0.5 % ophthalmic solution Place 1 drop into the right eye every 8 (eight) hours as needed.    . Multiple Vitamin (MULTIVITAMIN) capsule Take 1 capsule by mouth daily.    Marland Kitchen ofloxacin (OCUFLOX) 0.3 % ophthalmic solution 1 drop 4 (four) times daily.    Marland Kitchen omeprazole (PRILOSEC) 20 MG capsule TAKE ONE CAPSULE BY MOUTH EVERY DAY 90 capsule 1  . ONE TOUCH ULTRA TEST test strip USE TO TEST BLOOD SUGAR FOUR TIMES DAILY 360 each 3  . potassium chloride SA (K-DUR,KLOR-CON) 20 MEQ tablet TAKE ONE TABLET BY MOUTH TWICE DAILY 180 tablet 1  . prednisoLONE acetate (PRED FORTE)  1 % ophthalmic suspension Place 1 drop into the right eye 4 (four) times daily.    . sertraline (ZOLOFT) 100 MG tablet Take 1 tablet (100 mg total) by mouth 2 (two) times daily. 60 tablet 5  . torsemide (DEMADEX) 20 MG tablet TAKE TWO TABLETS DAILY (STOP LASIX) (Patient taking differently: TAKE TWO TABLETS 20MG   DAILY (STOP LASIX)) 180 tablet 3  . traMADol (ULTRAM) 50 MG tablet Take 1 tablet (50 mg total) by mouth every 8 (eight) hours as needed. 30 tablet 0  . UNABLE TO FIND Walker x 1  DX unsteady gait, osteoarthritis of left knee 1  each 0  . UNABLE TO FIND Elevated Toliet Seat x 1 DX: unsteady gait, back pain, osteoarthritis of left knee 1 each 0  . Vitamin D, Ergocalciferol, (DRISDOL) 50000 units CAPS capsule Take 1 capsule (50,000 Units total) by mouth every 7 (seven) days. 4 capsule 5   No facility-administered medications prior to visit.     PAST MEDICAL HISTORY: Past Medical History:  Diagnosis Date  . Alpha thalassemia trait 01/26/2010   02/2012: Nl CBC ex H&H-10.7/34.8, MCV-69   . Anemia   . Anxiety   . Anxiety and depression   . Cellulitis 05/09/2017  . Depression   . Diabetes mellitus   . GERD (gastroesophageal reflux disease)   . Headache(784.0)   . Hyperlipidemia   . Hypertension   . Iron deficiency 01/21/2017  . Microcytic anemia 01/26/2010   02/2012: Nl CBC ex H&H-10.7/34.8, MCV-69   . Obesity   . Obstructive sleep apnea   . Osteoarthritis    Left knee; right shoulder; chronic neck and back pain  . Pruritus   . Seizures ([place])   . Tremor    This started months ago after her seizure progressing to very poor hand writing  . Urinary incontinence   . UTI (urinary tract infection) 01/18/2013    PAST SURGICAL HISTORY: Past Surgical History:  Procedure Laterality Date  . ABDOMINAL HYSTERECTOMY    . BREAST EXCISIONAL BIOPSY     Left; cyst  . CATARACT EXTRACTION Right    12/2017  . CATARACT EXTRACTION W/ INTRAOCULAR LENS IMPLANT Left 09/07/2013  . CHOLECYSTECTOMY    . COLONOSCOPY    . COLONOSCOPY N/A 07/20/2015   Procedure: COLONOSCOPY;  Surgeon: Rogene Houston, MD;  Location: [facility];  Service: Endoscopy;  Laterality: N/A;  930  . EYE SURGERY Left 09/07/2013   cataract    FAMILY HISTORY: Family History  Problem Relation Age of Onset  . Lung cancer Mother 6  . Kidney disease Father   . Diabetes Sister   . Keloids Brother   . ADD / ADHD Grandchild   . Bipolar disorder Grandchild   . Bipolar disorder Daughter   . Seizures Daughter   . Heart disease Daughter   . Kidney disease  Son   . Neuropathy Son   . Kidney disease Son   . Edema Daughter   . Breast cancer Daughter 37  . Allergies Daughter   . Alcohol abuse Neg Hx   . Drug abuse Neg Hx     SOCIAL HISTORY:  Social History   Socioeconomic History  . Marital status: Married    Spouse name: saunders  . Number of children: 5  . Years of education: 3  . Highest education level: Not on file  Occupational History  . Occupation: Disabled     Employer: RETIRED  Social Needs  . Financial resource strain: Not on file  .  Food insecurity:    Worry: Not on file    Inability: Not on file  . Transportation needs:    Medical: Not on file    Non-medical: Not on file  Tobacco Use  . Smoking status: Never Smoker  . Smokeless tobacco: Never Used  Substance and Sexual Activity  . Alcohol use: No    Alcohol/week: 0.0 oz  . Drug use: No  . Sexual activity: Yes    Birth control/protection: Surgical  Lifestyle  . Physical activity:    Days per week: Not on file    Minutes per session: Not on file  . Stress: Not on file  Relationships  . Social connections:    Talks on phone: Not on file    Gets together: Not on file    Attends religious service: Not on file    Active member of club or organization: Not on file    Attends meetings of clubs or organizations: Not on file    Relationship status: Not on file  . Intimate partner violence:    Fear of current or ex partner: Not on file    Emotionally abused: Not on file    Physically abused: Not on file    Forced sexual activity: Not on file  Other Topics Concern  . Not on file  Social History Narrative   Patient lives at home with her husband [name].  Patient is retired.    Right handed.    Five Children.    Caffeine- 2 daily     PHYSICAL EXAM  GENERAL EXAM/CONSTITUTIONAL: Vitals:  Vitals:   02/04/18 1255  BP: (!) 154/65  Pulse: 74  Weight: 285 lb (129.3 kg)  Height: 5\' 8"  (1.727 m)     Body mass index is 43.33 kg/m.  No exam data  present  Patient is in no distress; well developed, nourished and groomed; neck is supple  CARDIOVASCULAR:  Examination of carotid arteries is normal; no carotid bruits  Regular rate and rhythm, no murmurs  Examination of peripheral vascular system by observation and palpation is normal  EYES:  Ophthalmoscopic exam of optic discs and posterior segments is normal; no papilledema or hemorrhages  MUSCULOSKELETAL:  Gait, strength, tone, movements noted in Neurologic exam below  NEUROLOGIC: MENTAL STATUS:  No flowsheet data found.  awake, alert, oriented to person, place and time  recent and remote memory intact  normal attention and concentration  language fluent, comprehension intact, naming intact,   fund of knowledge appropriate  TANGENTIAL THOUGHT PROCESS  CRANIAL NERVE:   2nd - no papilledema on fundoscopic exam  2nd, 3rd, 4th, 6th - pupils equal and reactive to light, visual fields full to confrontation, extraocular muscles intact, no nystagmus  5th - facial sensation symmetric  7th - facial strength symmetric  8th - hearing intact  9th - palate elevates symmetrically, uvula midline  11th - shoulder shrug symmetric  12th - tongue protrusion midline  MOTOR:   normal bulk and tone, full strength in the BUE, BLE  SENSORY:   normal and symmetric to light touch; ABSENT VIB AT TOES AND ANKLES; DECR PP IN FEET  COORDINATION:   finger-nose-finger, fine finger movements normal  REFLEXES:   deep tendon reflexes TRACE and symmetric; ABSENT AT ANKLES  GAIT/STATION:   IN WHEELCHAIR; UNSTEADY GAIT    DIAGNOSTIC DATA (LABS, IMAGING, TESTING) - I reviewed patient records, labs, notes, testing and imaging myself where available.  Lab Results  Component Value Date   WBC 5.2 01/02/2018  HGB 9.8 (L) 01/02/2018   HCT 31.9 (L) 01/02/2018   MCV 68.0 (L) 01/02/2018   PLT 204 01/02/2018      Component Value Date/Time   NA 138 08/19/2017 1712   K 4.5  08/19/2017 1712   CL 99 (L) 08/19/2017 1712   CO2 30 08/19/2017 1712   GLUCOSE 210 (H) 08/19/2017 1712   BUN 18 08/19/2017 1712   CREATININE 1.04 (H) 08/19/2017 1712   CREATININE 0.97 (H) 05/14/2017 1039   CALCIUM 9.6 08/19/2017 1712   PROT 8.0 08/19/2017 1712   ALBUMIN 4.0 08/19/2017 1712   AST 28 08/19/2017 1712   ALT 27 08/19/2017 1712   ALKPHOS 104 08/19/2017 1712   BILITOT 0.3 08/19/2017 1712   GFRNONAA 52 (L) 08/19/2017 1712   GFRNONAA 46 (L) 10/15/2016 1148   GFRAA 60 (L) 08/19/2017 1712   GFRAA 53 (L) 10/15/2016 1148   Lab Results  Component Value Date   CHOL 154 05/14/2017   HDL 76 05/14/2017   LDLCALC 62 05/14/2017   TRIG 79 05/14/2017   CHOLHDL 2.0 05/14/2017   Lab Results  Component Value Date   HGBA1C 8.5 (H) 12/10/2017   Lab Results  Component Value Date   WCBJSEGB15 176 02/21/2016   Lab Results  Component Value Date   TSH 2.01 05/14/2017    03/30/11 MRI lumbar spine  - Scoliosis convex to the left at the apex at L3. - L4-5:  Bilateral facet arthropathy with 2 mm of anterolisthesis. Bulging of the disc.  Mild narrowing of the right lateral recess. No apparent neural compression.  However, the facet arthropathy and disc disease could be symptomatic. - L5-S1:  Facet arthropathy without slippage.  No significant disc pathology.  Findings could relate to back pain but there is no stenosis.   01/24/12 MRI brain  - No acute infarct. - Moderate small vessel disease type changes. - Global atrophy without hydrocephalus. - Tortuous vertebral arteries and basilar artery.     ASSESSMENT AND PLAN  77 y.o. year old female here with progressive gait and balance difficulty, likely multifactorial, likely related to diabetic neuropathy, deconditioning, obesity, lumbar degenerative spine disease.  Dx:  1. Diabetic polyneuropathy associated with type 2 diabetes mellitus (HCC)      PLAN:  - continue diabetes treatment - continue PT evaluation for gait and  balance difficulty - no diabetic nerve pain at this time time; may consider reducing gabapentin  Return for return to PCP and podiatry.    Penni Bombard, MD 1/60/7371, 0:62 PM Certified in Neurology, Neurophysiology and Neuroimaging  [facility] [address] [place], [place] [address] [phone]

## 2018-02-04 NOTE — Patient Instructions (Signed)
-   continue diabetes treatment  - continue PT evaluation for balance difficulty  - no diabetic nerve pain at this time time; may consider reducing gabapentin

## 2018-02-07 DIAGNOSIS — H43812 Vitreous degeneration, left eye: Secondary | ICD-10-CM | POA: Diagnosis not present

## 2018-02-07 DIAGNOSIS — E113293 Type 2 diabetes mellitus with mild nonproliferative diabetic retinopathy without macular edema, bilateral: Secondary | ICD-10-CM | POA: Diagnosis not present

## 2018-02-12 ENCOUNTER — Other Ambulatory Visit: Payer: Self-pay | Admitting: Family Medicine

## 2018-02-12 ENCOUNTER — Telehealth: Payer: Self-pay | Admitting: Family Medicine

## 2018-02-12 DIAGNOSIS — Z1231 Encounter for screening mammogram for malignant neoplasm of breast: Secondary | ICD-10-CM

## 2018-02-12 NOTE — Telephone Encounter (Signed)
This pt has a wellness visit on 02/24/2018. I tried calling her today as she has many outstanding labs and a mammogram t, and has missed appts here with me repeatedly. Left a voice message. If she keeps appt pls order these labs and lether know I would like her to come in to see me and schedule a 3 week f/u with labs done at least 1 week  before the visit fasting lipid, cmp and EGFR, iron, B12, ferritin and folate, vit D, UA and microalb  I have already oerdered a mammogram which is 2 to 3 years overdue , please encourage her to have this

## 2018-02-24 ENCOUNTER — Ambulatory Visit (INDEPENDENT_AMBULATORY_CARE_PROVIDER_SITE_OTHER): Payer: PPO

## 2018-02-24 ENCOUNTER — Telehealth: Payer: Self-pay

## 2018-02-24 VITALS — BP 130/58 | HR 70 | Temp 98.7°F | Resp 18 | Ht 68.0 in | Wt 278.0 lb

## 2018-02-24 DIAGNOSIS — E669 Obesity, unspecified: Secondary | ICD-10-CM | POA: Diagnosis not present

## 2018-02-24 DIAGNOSIS — E785 Hyperlipidemia, unspecified: Secondary | ICD-10-CM

## 2018-02-24 DIAGNOSIS — E559 Vitamin D deficiency, unspecified: Secondary | ICD-10-CM

## 2018-02-24 DIAGNOSIS — Z0000 Encounter for general adult medical examination without abnormal findings: Secondary | ICD-10-CM | POA: Diagnosis not present

## 2018-02-24 DIAGNOSIS — E611 Iron deficiency: Secondary | ICD-10-CM

## 2018-02-24 DIAGNOSIS — I10 Essential (primary) hypertension: Secondary | ICD-10-CM

## 2018-02-24 DIAGNOSIS — E1169 Type 2 diabetes mellitus with other specified complication: Secondary | ICD-10-CM

## 2018-02-24 NOTE — Telephone Encounter (Signed)
Noted, thanks!

## 2018-02-24 NOTE — Telephone Encounter (Signed)
Laura Atkins came in today for her AWV. All of the requested labs were ordered as per Dr.Simpson. Patient's mammogram was scheduled for 02/27/18. Patient was reminded of her f/u appointment on 03/13/18. Patient was instructed to go and have FASTING labs 1 week prior to her f/u appointment with Dr.Simpson with verbal understanding. She was educated on the importance of having these labs drawn and having her mammogram completed since it is 2-3 years overdue.

## 2018-02-24 NOTE — Telephone Encounter (Signed)
Laura Atkins came in today for her AWV visit. All of her labs were ordered and her mammogram was scheduled. She was instructed to have the fasting labs drawn 1 week prior to her f/u visit with Dr. Moshe Cipro with verbal understanding.

## 2018-02-24 NOTE — Progress Notes (Signed)
Subjective:   Laura Atkins is a 77 y.o. female who presents for Medicare Annual (Subsequent) preventive examination.  Review of Systems:   Cardiac Risk Factors include: advanced age (>40men, >77 women);diabetes mellitus;dyslipidemia;hypertension;obesity (BMI >30kg/m2)     Objective:     Vitals: BP (!) 130/58 (BP Location: Left Arm, Patient Position: Sitting, Cuff Size: Large)   Pulse 70   Temp 98.7 F (37.1 C) (Temporal)   Resp 18   Ht 5\' 8"  (1.727 m)   Wt 278 lb (126.1 kg)   SpO2 98%   BMI 42.27 kg/m   Body mass index is 42.27 kg/m.  Advanced Directives 02/24/2018 08/19/2017 05/08/2017 03/26/2017 03/22/2017 02/11/2017 01/24/2017  Does Patient Have a Medical Advance Directive? No No No No No No No  Type of Advance Directive - - - - - - -  Copy of Healthcare Power of Attorney in Chart? - - - - - - -  Would patient like information on creating a medical advance directive? Yes (MAU/Ambulatory/Procedural Areas - Information given) No - Patient declined - Yes (MAU/Ambulatory/Procedural Areas - Information given) - Yes (MAU/Ambulatory/Procedural Areas - Information given) No - Patient declined  Pre-existing out of facility DNR order (yellow form or pink MOST form) - - - - - - -    Tobacco Social History   Tobacco Use  Smoking Status Never Smoker  Smokeless Tobacco Never Used     Counseling given: Yes   Clinical Intake:  Pre-visit preparation completed: Yes  Pain : 0-10 Pain Score: 7  Pain Type: Chronic pain Pain Location: Knee(both) Pain Orientation: Right, Left Pain Descriptors / Indicators: Constant, Nagging(left is constant right knee is nagging and hurts more with walking) Pain Onset: More than a month ago Pain Relieving Factors: pain relieft cream Effect of Pain on Daily Activities: yes  Pain Relieving Factors: pain relieft cream  BMI - recorded: 42.3 Nutritional Status: BMI > 30  Obese Diabetes: Yes CBG done?: No Did pt. bring in CBG monitor from home?:  No  How often do you need to have someone help you when you read instructions, pamphlets, or other written materials from your doctor or pharmacy?: 1 - Never What is the last grade level you completed in school?: 12  Interpreter Needed?: No  Information entered by :: Abby  Past Medical History:  Diagnosis Date  . Alpha thalassemia trait 01/26/2010   02/2012: Nl CBC ex H&H-10.7/34.8, MCV-69   . Anemia   . Anxiety   . Anxiety and depression   . Cellulitis 05/09/2017  . Depression   . Diabetes mellitus   . GERD (gastroesophageal reflux disease)   . Headache(784.0)   . Hyperlipidemia   . Hypertension   . Iron deficiency 01/21/2017  . Microcytic anemia 01/26/2010   02/2012: Nl CBC ex H&H-10.7/34.8, MCV-69   . Obesity   . Obstructive sleep apnea   . Osteoarthritis    Left knee; right shoulder; chronic neck and back pain  . Pruritus   . Seizures ([place])   . Tremor    This started months ago after her seizure progressing to very poor hand writing  . Urinary incontinence   . UTI (urinary tract infection) 01/18/2013   Past Surgical History:  Procedure Laterality Date  . ABDOMINAL HYSTERECTOMY    . BREAST EXCISIONAL BIOPSY     Left; cyst  . CATARACT EXTRACTION Right    12/2017  . CATARACT EXTRACTION W/ INTRAOCULAR LENS IMPLANT Left 09/07/2013  . CHOLECYSTECTOMY    . COLONOSCOPY    .  COLONOSCOPY N/A 07/20/2015   Procedure: COLONOSCOPY;  Surgeon: Rogene Houston, MD;  Location: [facility];  Service: Endoscopy;  Laterality: N/A;  930  . EYE SURGERY Left 09/07/2013   cataract   Family History  Problem Relation Age of Onset  . Lung cancer Mother 49  . Kidney disease Father   . Diabetes Sister   . Keloids Brother   . ADD / ADHD Grandchild   . Bipolar disorder Grandchild   . Bipolar disorder Daughter   . Seizures Daughter   . Heart disease Daughter   . Kidney disease Son   . Neuropathy Son   . Kidney disease Son   . Edema Daughter   . Breast cancer Daughter 43  . Allergies  Daughter   . Alcohol abuse Neg Hx   . Drug abuse Neg Hx    Social History   Socioeconomic History  . Marital status: Married    Spouse name: saunders  . Number of children: 5  . Years of education: 28  . Highest education level: Not on file  Occupational History  . Occupation: Disabled     Employer: RETIRED  Social Needs  . Financial resource strain: Not hard at all  . Food insecurity:    Worry: Never true    Inability: Never true  . Transportation needs:    Medical: No    Non-medical: No  Tobacco Use  . Smoking status: Never Smoker  . Smokeless tobacco: Never Used  Substance and Sexual Activity  . Alcohol use: No    Alcohol/week: 0.0 oz  . Drug use: No  . Sexual activity: Yes    Birth control/protection: Surgical  Lifestyle  . Physical activity:    Days per week: 0 days    Minutes per session: 0 min  . Stress: Only a little  Relationships  . Social connections:    Talks on phone: Twice a week    Gets together: Twice a week    Attends religious service: More than 4 times per year    Active member of club or organization: Yes    Attends meetings of clubs or organizations: More than 4 times per year    Relationship status: Married  Other Topics Concern  . Not on file  Social History Narrative   Patient lives at home with her husband [name].  Patient is retired.    Right handed.    Five Children.    Caffeine- 2 daily    Outpatient Encounter Medications as of 02/24/2018  Medication Sig  . acetaminophen (EXTRA STRENGTH PAIN RELIEF) 500 MG tablet Take 500 mg by mouth every 6 (six) hours as needed for pain.  Marland Kitchen amLODipine (NORVASC) 10 MG tablet TAKE ONE TABLET BY MOUTH EVERY DAY  . aspirin 81 MG tablet Take 81 mg by mouth daily.    Marland Kitchen atorvastatin (LIPITOR) 40 MG tablet TAKE ONE TABLET BY MOUTH EVERY DAY  . benazepril (LOTENSIN) 20 MG tablet TAKE ONE TABLET BY MOUTH EVERY DAY  . diclofenac (VOLTAREN) 75 MG EC tablet Take 1 tablet (75 mg total) by mouth 2 (two)  times daily.  . Diclofenac Sodium (PENNSAID) 2 % SOLN Place 1 Squirt onto the skin 2 (two) times daily.  . diphenhydrAMINE (BENADRYL) 25 mg capsule Take 25 mg by mouth at bedtime. Reported on 10/18/2015  . gabapentin (NEURONTIN) 400 MG capsule TAKE ONE CAPSULE BY MOUTH EVERY MORNING and TAKE ONE CAPSULE AT NOON and TAKE TWO CAPSULES AT BEDTIME  . insulin NPH-regular  Human (NOVOLIN 70/30) (70-30) 100 UNIT/ML injection Inject 80 Units into the skin daily with breakfast. and syringes 2/day.  . ketorolac (ACULAR) 0.5 % ophthalmic solution Place 1 drop into the right eye every 8 (eight) hours as needed.  . Multiple Vitamin (MULTIVITAMIN) capsule Take 1 capsule by mouth daily.  Marland Kitchen ofloxacin (OCUFLOX) 0.3 % ophthalmic solution 1 drop 4 (four) times daily.  Marland Kitchen omeprazole (PRILOSEC) 20 MG capsule TAKE ONE CAPSULE BY MOUTH EVERY DAY  . ONE TOUCH ULTRA TEST test strip USE TO TEST BLOOD SUGAR FOUR TIMES DAILY  . potassium chloride SA (K-DUR,KLOR-CON) 20 MEQ tablet TAKE ONE TABLET BY MOUTH TWICE DAILY  . prednisoLONE acetate (PRED FORTE) 1 % ophthalmic suspension Place 1 drop into the right eye 4 (four) times daily.  . sertraline (ZOLOFT) 100 MG tablet Take 1 tablet (100 mg total) by mouth 2 (two) times daily.  Marland Kitchen torsemide (DEMADEX) 20 MG tablet TAKE TWO TABLETS DAILY (STOP LASIX) (Patient taking differently: TAKE TWO TABLETS 20MG   DAILY (STOP LASIX))  . traMADol (ULTRAM) 50 MG tablet Take 1 tablet (50 mg total) by mouth every 8 (eight) hours as needed.  Marland Kitchen UNABLE TO FIND Walker x 1  DX unsteady gait, osteoarthritis of left knee  . UNABLE TO FIND Elevated Toliet Seat x 1 DX: unsteady gait, back pain, osteoarthritis of left knee  . Vitamin D, Ergocalciferol, (DRISDOL) 50000 units CAPS capsule Take 1 capsule (50,000 Units total) by mouth every 7 (seven) days.  . [DISCONTINUED] ciprofloxacin (CIPRO) 500 MG tablet Take 1 tablet (500 mg total) by mouth 2 (two) times daily.  . [DISCONTINUED] HYDROcodone-acetaminophen  (NORCO/VICODIN) 5-325 MG tablet Take 1 tablet by mouth every 6 (six) hours as needed.  . [DISCONTINUED] insulin NPH-regular Human (NOVOLIN 70/30) (70-30) 100 UNIT/ML injection 60 units with breakfast and 15 units with supper, and syringes 2/day.  . [DISCONTINUED] naproxen (NAPROSYN) 500 MG tablet TAKE 1 TABLET BY MOUTH TWICE DAILY WITH A MEAL  . [DISCONTINUED] ondansetron (ZOFRAN) 4 MG tablet Take 1 tablet (4 mg total) by mouth daily as needed for nausea or vomiting.  . [DISCONTINUED] sertraline (ZOLOFT) 100 MG tablet TAKE ONE TABLET BY MOUTH TWICE DAILY  . [DISCONTINUED] traMADol (ULTRAM) 50 MG tablet Take 1 tablet (50 mg total) by mouth every 12 (twelve) hours as needed.  . [DISCONTINUED] UNABLE TO FIND Wheeled walker ( 4 wheel) with seat  Dx unsteady gait   No facility-administered encounter medications on file as of 02/24/2018.     Activities of Daily Living In your present state of health, do you have any difficulty performing the following activities: 02/24/2018 10/28/2017  Hearing? N N  Vision? N N  Difficulty concentrating or making decisions? Y N  Walking or climbing stairs? Y N  Dressing or bathing? Y N  Doing errands, shopping? Y N  Preparing Food and eating ? Y -  Using the Toilet? Y -  In the past six months, have you accidently leaked urine? Y -  Do you have problems with loss of bowel control? N -  Managing your Medications? N -  Managing your Finances? N -  Housekeeping or managing your Housekeeping? Y -  Some recent data might be hidden    Patient Care Team: Fayrene Helper, MD as PCP - General Marcial Pacas, MD as Consulting Physician (Neurology) Renato Shin, MD as Consulting Physician (Endocrinology) Cloria Spring, MD as Consulting Physician ([place]) Alonza Smoker, LCSW as Social Worker (Psychiatry) Clent Jacks, MD as Consulting  Physician (Ophthalmology)    Assessment:   This is a routine wellness examination for Laura Atkins.  Exercise Activities  and Dietary recommendations Current Exercise Habits: The patient does not participate in regular exercise at present, Exercise limited by: orthopedic condition(s)  Goals    . Exercise 3x per week (30 min per time)     Recommend starting a routine exercise program at least 3 days a week for 30-45 minutes at a time as tolerated.         Fall Risk Fall Risk  02/24/2018 10/28/2017 03/26/2017 03/05/2017 02/11/2017  Falls in the past year? No Yes Yes Yes Yes  Number falls in past yr: - 2 or more 2 or more 2 or more 2 or more  Injury with Fall? - No Yes Yes Yes  Risk Factor Category  - - High Fall Risk High Fall Risk High Fall Risk  Risk for fall due to : - - Impaired balance/gait;Impaired mobility Impaired balance/gait;Impaired mobility History of fall(s);Impaired balance/gait;Impaired mobility  Follow up - - Falls prevention discussed;Education provided Falls evaluation completed;Falls prevention discussed Falls evaluation completed;Education provided  Comment - - - - recommend safe strides program with [facility] at home   Is the patient's home free of loose throw rugs in walkways, pet beds, electrical cords, etc?   no      Grab bars in the bathroom? yes      Handrails on the stairs?   no single level home      Adequate lighting?   yes  Timed Get Up and Go performed: No  Depression Screen PHQ 2/9 Scores 02/24/2018 10/28/2017 03/26/2017 02/11/2017  PHQ - 2 Score 0 0 2 2  PHQ- 9 Score - - 3 5  Exception Documentation - - - -     Cognitive Function     6CIT Screen 02/24/2018 02/11/2017  What Year? 0 points 0 points  What month? 0 points 0 points  What time? 0 points 0 points  Count back from 20 0 points 0 points  Months in reverse 0 points 0 points  Repeat phrase 2 points 0 points  Total Score 2 0    Immunization History  Administered Date(s) Administered  . H1N1 07/15/2008  . Influenza Split 06/17/2012  . Influenza Whole 07/03/2007, 06/17/2009, 06/07/2011  . Influenza,inj,Quad PF,6+ Mos  07/13/2013, 09/06/2014, 08/10/2015, 05/23/2016, 05/22/2017  . Pneumococcal Conjugate-13 05/03/2014  . Pneumococcal Polysaccharide-23 09/08/2010  . Td 09/08/2010    Qualifies for Shingles Vaccine?Call your insurance company to see if they will cover this vaccine.   Screening Tests Health Maintenance  Topic Date Due  . OPHTHALMOLOGY EXAM  12/17/2014  . MAMMOGRAM  02/28/2018  . INFLUENZA VACCINE  04/24/2018  . HEMOGLOBIN A1C  06/12/2018  . FOOT EXAM  12/11/2018  . TETANUS/TDAP  09/08/2020  . COLONOSCOPY  07/19/2025  . DEXA SCAN  Completed  . PNA vac Low Risk Adult  Completed    Cancer Screenings: Lung: Low Dose CT Chest recommended if Age 85-80 years, 30 pack-year currently smoking OR have quit w/in 15years. Patient does not qualify. Breast:  Up to date on Mammogram? No   Up to date of Bone Density/Dexa? Yes Colorectal: UTD      Plan:      I have personally reviewed and noted the following in the patient's chart:   . Medical and social history . Use of alcohol, tobacco or illicit drugs  . Current medications and supplements . Functional ability and status . Nutritional status . Physical  activity . Advanced directives . List of other physicians . Hospitalizations, surgeries, and ER visits in previous 12 months . Vitals . Screenings to include cognitive, depression, and falls . Referrals and appointments  In addition, I have reviewed and discussed with patient certain preventive protocols, quality metrics, and best practice recommendations. A written personalized care plan for preventive services as well as general preventive health recommendations were provided to patient.     Tod Persia, [place]  02/24/2018

## 2018-02-24 NOTE — Patient Instructions (Signed)
Laura Atkins , Thank you for taking time to come for your Medicare Wellness Visit. I appreciate your ongoing commitment to your health goals. Please review the following plan we discussed and let me know if I can assist you in the future.   Screening recommendations/referrals: Colonoscopy: UTD Mammogram: We will schedule this before you leave today Bone Density: UTD Recommended yearly ophthalmology/optometry visit for glaucoma screening and checkup Recommended yearly dental visit for hygiene and checkup  Vaccinations: Influenza vaccine: Due in Fall 2019 Pneumococcal vaccine: UTD Tdap vaccine: UTD Shingles vaccine: Call your insurance company to see if this is a covered vaccine   Advanced directives: Your husband is aware of your wishes  Conditions/risks identified: We have discussed these during your visit today  Next appointment: March 13, 2018 at 1:00 pm with Dr.Simpson. YOU NEED TO HAVE FASTING LABS DRAWN 1 WEEK BEFORE THIS APPOINTMENT!! (March 06, 2018) IT IS VERY IMPORTANT YOU KEEP THIS APPOINTMENT!!!!   Preventive Care 28 Years and Older, Female Preventive care refers to lifestyle choices and visits with your health care provider that can promote health and wellness. What does preventive care include?  A yearly physical exam. This is also called an annual well check.  Dental exams once or twice a year.  Routine eye exams. Ask your health care provider how often you should have your eyes checked.  Personal lifestyle choices, including:  Daily care of your teeth and gums.  Regular physical activity.  Eating a healthy diet.  Avoiding tobacco and drug use.  Limiting alcohol use.  Practicing safe sex.  Taking low-dose aspirin every day.  Taking vitamin and mineral supplements as recommended by your health care provider. What happens during an annual well check? The services and screenings done by your health care provider during your annual well check will depend on  your age, overall health, lifestyle risk factors, and family history of disease. Counseling  Your health care provider may ask you questions about your:  Alcohol use.  Tobacco use.  Drug use.  Emotional well-being.  Home and relationship well-being.  Sexual activity.  Eating habits.  History of falls.  Memory and ability to understand (cognition).  Work and work Statistician.  Reproductive health. Screening  You may have the following tests or measurements:  Height, weight, and BMI.  Blood pressure.  Lipid and cholesterol levels. These may be checked every 5 years, or more frequently if you are over 46 years old.  Skin check.  Lung cancer screening. You may have this screening every year starting at age 72 if you have a 30-pack-year history of smoking and currently smoke or have quit within the past 15 years.  Fecal occult blood test (FOBT) of the stool. You may have this test every year starting at age 65.  Flexible sigmoidoscopy or colonoscopy. You may have a sigmoidoscopy every 5 years or a colonoscopy every 10 years starting at age 22.  Hepatitis C blood test.  Hepatitis B blood test.  Sexually transmitted disease (STD) testing.  Diabetes screening. This is done by checking your blood sugar (glucose) after you have not eaten for a while (fasting). You may have this done every 1-3 years.  Bone density scan. This is done to screen for osteoporosis. You may have this done starting at age 9.  Mammogram. This may be done every 1-2 years. Talk to your health care provider about how often you should have regular mammograms. Talk with your health care provider about your test results, treatment options,  and if necessary, the need for more tests. Vaccines  Your health care provider may recommend certain vaccines, such as:  Influenza vaccine. This is recommended every year.  Tetanus, diphtheria, and acellular pertussis (Tdap, Td) vaccine. You may need a Td booster  every 10 years.  Zoster vaccine. You may need this after age 40.  Pneumococcal 13-valent conjugate (PCV13) vaccine. One dose is recommended after age 64.  Pneumococcal polysaccharide (PPSV23) vaccine. One dose is recommended after age 24. Talk to your health care provider about which screenings and vaccines you need and how often you need them. This information is not intended to replace advice given to you by your health care provider. Make sure you discuss any questions you have with your health care provider. Document Released: 10/07/2015 Document Revised: 05/30/2016 Document Reviewed: 07/12/2015 Elsevier Interactive Patient Education  2017 [place] Prevention in the Home Falls can cause injuries. They can happen to people of all ages. There are many things you can do to make your home safe and to help prevent falls. What can I do on the outside of my home?  Regularly fix the edges of walkways and driveways and fix any cracks.  Remove anything that might make you trip as you walk through a door, such as a raised step or threshold.  Trim any bushes or trees on the path to your home.  Use bright outdoor lighting.  Clear any walking paths of anything that might make someone trip, such as rocks or tools.  Regularly check to see if handrails are loose or broken. Make sure that both sides of any steps have handrails.  Any raised decks and porches should have guardrails on the edges.  Have any leaves, snow, or ice cleared regularly.  Use sand or salt on walking paths during winter.  Clean up any spills in your garage right away. This includes oil or grease spills. What can I do in the bathroom?  Use night lights.  Install grab bars by the toilet and in the tub and shower. Do not use towel bars as grab bars.  Use non-skid mats or decals in the tub or shower.  If you need to sit down in the shower, use a plastic, non-slip stool.  Keep the floor dry. Clean up any  water that spills on the floor as soon as it happens.  Remove soap buildup in the tub or shower regularly.  Attach bath mats securely with double-sided non-slip rug tape.  Do not have throw rugs and other things on the floor that can make you trip. What can I do in the bedroom?  Use night lights.  Make sure that you have a light by your bed that is easy to reach.  Do not use any sheets or blankets that are too big for your bed. They should not hang down onto the floor.  Have a firm chair that has side arms. You can use this for support while you get dressed.  Do not have throw rugs and other things on the floor that can make you trip. What can I do in the kitchen?  Clean up any spills right away.  Avoid walking on wet floors.  Keep items that you use a lot in easy-to-reach places.  If you need to reach something above you, use a strong step stool that has a grab bar.  Keep electrical cords out of the way.  Do not use floor polish or wax that makes floors slippery. If  you must use wax, use non-skid floor wax.  Do not have throw rugs and other things on the floor that can make you trip. What can I do with my stairs?  Do not leave any items on the stairs.  Make sure that there are handrails on both sides of the stairs and use them. Fix handrails that are broken or loose. Make sure that handrails are as long as the stairways.  Check any carpeting to make sure that it is firmly attached to the stairs. Fix any carpet that is loose or worn.  Avoid having throw rugs at the top or bottom of the stairs. If you do have throw rugs, attach them to the floor with carpet tape.  Make sure that you have a light switch at the top of the stairs and the bottom of the stairs. If you do not have them, ask someone to add them for you. What else can I do to help prevent falls?  Wear shoes that:  Do not have high heels.  Have rubber bottoms.  Are comfortable and fit you well.  Are closed  at the toe. Do not wear sandals.  If you use a stepladder:  Make sure that it is fully opened. Do not climb a closed stepladder.  Make sure that both sides of the stepladder are locked into place.  Ask someone to hold it for you, if possible.  Clearly mark and make sure that you can see:  Any grab bars or handrails.  First and last steps.  Where the edge of each step is.  Use tools that help you move around (mobility aids) if they are needed. These include:  Canes.  Walkers.  Scooters.  Crutches.  Turn on the lights when you go into a dark area. Replace any light bulbs as soon as they burn out.  Set up your furniture so you have a clear path. Avoid moving your furniture around.  If any of your floors are uneven, fix them.  If there are any pets around you, be aware of where they are.  Review your medicines with your doctor. Some medicines can make you feel dizzy. This can increase your chance of falling. Ask your doctor what other things that you can do to help prevent falls. This information is not intended to replace advice given to you by your health care provider. Make sure you discuss any questions you have with your health care provider. Document Released: 07/07/2009 Document Revised: 02/16/2016 Document Reviewed: 10/15/2014 Elsevier Interactive Patient Education  2017 [employer].

## 2018-02-27 ENCOUNTER — Other Ambulatory Visit: Payer: Self-pay | Admitting: Family Medicine

## 2018-02-27 ENCOUNTER — Encounter (HOSPITAL_COMMUNITY): Payer: Self-pay

## 2018-02-27 ENCOUNTER — Ambulatory Visit (HOSPITAL_COMMUNITY)
Admission: RE | Admit: 2018-02-27 | Discharge: 2018-02-27 | Disposition: A | Discharge status: Home / Self Care | Payer: PPO | Source: Ambulatory Visit | Attending: Family Medicine | Admitting: Family Medicine

## 2018-02-27 DIAGNOSIS — Z1231 Encounter for screening mammogram for malignant neoplasm of breast: Secondary | ICD-10-CM | POA: Insufficient documentation

## 2018-03-03 ENCOUNTER — Other Ambulatory Visit: Payer: Self-pay

## 2018-03-03 DIAGNOSIS — E669 Obesity, unspecified: Secondary | ICD-10-CM

## 2018-03-03 DIAGNOSIS — E611 Iron deficiency: Secondary | ICD-10-CM

## 2018-03-03 DIAGNOSIS — E785 Hyperlipidemia, unspecified: Secondary | ICD-10-CM | POA: Diagnosis not present

## 2018-03-03 DIAGNOSIS — E559 Vitamin D deficiency, unspecified: Secondary | ICD-10-CM

## 2018-03-03 DIAGNOSIS — I10 Essential (primary) hypertension: Secondary | ICD-10-CM | POA: Diagnosis not present

## 2018-03-03 DIAGNOSIS — E1169 Type 2 diabetes mellitus with other specified complication: Secondary | ICD-10-CM | POA: Diagnosis not present

## 2018-03-04 LAB — LIPID PANEL
Cholesterol: 181 mg/dL (ref <200)
HDL: 65 mg/dL (ref >50)
LDL Cholesterol (Calc): 98 mg/dL (calc)
Non-HDL Cholesterol (Calc): 116 mg/dL (calc) (ref <130)
Total CHOL/HDL Ratio: 2.8 (calc) (ref <5.0)
Triglycerides: 86 mg/dL (ref <150)

## 2018-03-04 LAB — URINE CULTURE
MICRO NUMBER:: 90695921
SPECIMEN QUALITY:: ADEQUATE

## 2018-03-04 LAB — FERRITIN: Ferritin: 112 ng/mL (ref 20–288)

## 2018-03-04 LAB — B12 AND FOLATE PANEL
Folate: 11.7 ng/mL
Vitamin B-12: 569 pg/mL (ref 200–1100)

## 2018-03-04 LAB — IRON: Iron: 51 ug/dL (ref 45–160)

## 2018-03-13 ENCOUNTER — Encounter: Payer: Self-pay | Admitting: Family Medicine

## 2018-03-13 ENCOUNTER — Ambulatory Visit (INDEPENDENT_AMBULATORY_CARE_PROVIDER_SITE_OTHER): Payer: PPO | Admitting: Family Medicine

## 2018-03-13 VITALS — BP 150/64 | HR 78 | Resp 16 | Ht 68.0 in | Wt 272.0 lb

## 2018-03-13 DIAGNOSIS — E7849 Other hyperlipidemia: Secondary | ICD-10-CM | POA: Diagnosis not present

## 2018-03-13 DIAGNOSIS — R5383 Other fatigue: Secondary | ICD-10-CM

## 2018-03-13 DIAGNOSIS — F419 Anxiety disorder, unspecified: Secondary | ICD-10-CM

## 2018-03-13 DIAGNOSIS — F329 Major depressive disorder, single episode, unspecified: Secondary | ICD-10-CM | POA: Diagnosis not present

## 2018-03-13 DIAGNOSIS — E1169 Type 2 diabetes mellitus with other specified complication: Secondary | ICD-10-CM

## 2018-03-13 DIAGNOSIS — E669 Obesity, unspecified: Secondary | ICD-10-CM | POA: Diagnosis not present

## 2018-03-13 DIAGNOSIS — I10 Essential (primary) hypertension: Secondary | ICD-10-CM

## 2018-03-13 DIAGNOSIS — F32A Depression, unspecified: Secondary | ICD-10-CM

## 2018-03-13 DIAGNOSIS — E559 Vitamin D deficiency, unspecified: Secondary | ICD-10-CM | POA: Diagnosis not present

## 2018-03-13 NOTE — Patient Instructions (Addendum)
Annual physical exam in 4 months, call if you need me before   Please get CMP and EGFR, Vitamin D CBC and TSH 1 week before  Next visit  Please be careful not to fall  Please work on reducing carbs and sweets  Please keep active  Thank you  for choosing [facility]. We consider it a privelige to serve you.  Delivering excellent health care in a caring and  compassionate way is our goal.  Partnering with you,  so that together we can achieve this goal is our strategy.

## 2018-03-14 ENCOUNTER — Encounter: Payer: Self-pay | Admitting: Family Medicine

## 2018-03-14 NOTE — Assessment & Plan Note (Signed)
Hyperlipidemia:Low fat diet discussed and encouraged.   Lipid Panel  Lab Results  Component Value Date   CHOL 181 03/03/2018   HDL 65 03/03/2018   LDLCALC 98 03/03/2018   TRIG 86 03/03/2018   CHOLHDL 2.8 03/03/2018

## 2018-03-14 NOTE — Progress Notes (Signed)
Laura Atkins     MRN: [id number]      DOB: 1941-04-25   HPI Laura Atkins is here for follow up and re-evaluation of chronic medical conditions, medication management and review of any available recent lab and radiology data.  Preventive health is updated, specifically  Cancer screening and Immunization.   since the last visit.  Has been having consultations and been doing fairly well Denies polyuria, polydipsia, blurred vision , or hypoglycemic episodes.   ROS Denies recent fever or chills. Denies sinus pressure, nasal congestion, ear pain or sore throat. Denies chest congestion, productive cough or wheezing. Denies chest pains, palpitations and leg swelling Denies abdominal pain, nausea, vomiting,diarrhea or constipation.   Denies dysuria, frequency, hesitancy or incontinence. Denies joint pain, swelling and limitation in mobility. Denies headaches, seizures, numbness, or tingling. Denies depression, anxiety or insomnia. Denies skin break down or rash.   PE  BP (!) 150/64   Pulse 78   Resp 16   Ht 5\' 8"  (1.727 m)   Wt 272 lb (123.4 kg)   SpO2 96%   BMI 41.36 kg/m   Patient alert and oriented and in no cardiopulmonary distress.  HEENT: No facial asymmetry, EOMI,   oropharynx pink and moist.  Neck supple no JVD, no mass.  Chest: Clear to auscultation bilaterally.  CVS: S1, S2 no murmurs, no S3.Regular rate.  ABD: Soft non tender.   Ext: No edema  MS: Adequate ROM spine, shoulders, hips and knees.  Skin: Intact, no ulcerations or rash noted.  Psych: Good eye contact, normal affect. Memory intact not anxious or depressed appearing.  CNS: CN 2-12 intact, power,  normal throughout.no focal deficits noted.   Assessment & Plan  Essential hypertension UnControlled, no change in medication, needs to reduce salt in diet DASH diet and commitment to daily physical activity for a minimum of 30 minutes discussed and encouraged, as a part of hypertension  management. The importance of attaining a healthy weight is also discussed.  BP/Weight 03/13/2018 02/24/2018 02/04/2018 01/02/2018 12/10/2017 10/28/2017 34/74/2595  Systolic BP 638 756 433 295 188 416 606  Diastolic BP 64 58 65 64 62 58 47  Wt. (Lbs) 272 278 285 285.12 286.6 288.12 287  BMI 41.36 42.27 43.33 43.35 43.58 43.81 43.64  Some encounter information is confidential and restricted. Go to Review Flowsheets activity to see all data.       Diabetes mellitus type 2 in obese [facility]) Laura Atkins is reminded of the importance of commitment to daily physical activity for 30 minutes or more, as able and the need to limit carbohydrate intake to 30 to 60 grams per meal to help with blood sugar control.   The need to take medication as prescribed, test blood sugar as directed, and to call between visits if there is a concern that blood sugar is uncontrolled is also discussed.   Laura Atkins is reminded of the importance of daily foot exam, annual eye examination, and good blood sugar, blood pressure and cholesterol control. Currently uncontrolled and in the care of endocrinology  Diabetic Labs Latest Ref Rng & Units 03/03/2018 12/10/2017 08/19/2017 08/12/2017 05/14/2017  HbA1c 4.6 - 6.5 % - 8.5(H) - 8.2 -  Microalbumin Not Estab. ug/mL - - - - -  Micro/Creat Ratio 0.0 - 30.0 mg/g creat - - - - -  Chol <200 mg/dL 181 - - - 154  HDL >50 mg/dL 65 - - - 76  Calc LDL mg/dL (calc) 98 - - - 62  Triglycerides <150 mg/dL 86 - - - 79  Creatinine 0.44 - 1.00 mg/dL - - 1.04(H) - 0.97(H)   BP/Weight 03/13/2018 02/24/2018 02/04/2018 01/02/2018 12/10/2017 10/28/2017 29/52/8413  Systolic BP 244 010 272 536 644 034 742  Diastolic BP 64 58 65 64 62 58 47  Wt. (Lbs) 272 278 285 285.12 286.6 288.12 287  BMI 41.36 42.27 43.33 43.35 43.58 43.81 43.64  Some encounter information is confidential and restricted. Go to Review Flowsheets activity to see all data.   Foot/eye exam completion dates Latest Ref Rng & Units 12/10/2017  08/12/2017  Eye Exam No Retinopathy - -  Foot Form Completion - Done Done         Morbid obesity Improved encouraged to  reduce intake of food Patient re-educated about  the importance of commitment to a  minimum of 150 minutes of exercise per week.  The importance of healthy food choices with portion control discussed. Encouraged to start a food diary, count calories and to consider  joining a support group. Sample diet sheets offered. Goals set by the patient for the next several months.   Weight /BMI 03/13/2018 02/24/2018 02/04/2018  WEIGHT 272 lb 278 lb 285 lb  HEIGHT 5\' 8"  5\' 8"  5\' 8"   BMI 41.36 kg/m2 42.27 kg/m2 43.33 kg/m2  Some encounter information is confidential and restricted. Go to Review Flowsheets activity to see all data.      Anxiety and depression Controlled, no change in medication   Hyperlipemia Hyperlipidemia:Low fat diet discussed and encouraged.   Lipid Panel  Lab Results  Component Value Date   CHOL 181 03/03/2018   HDL 65 03/03/2018   LDLCALC 98 03/03/2018   TRIG 86 03/03/2018   CHOLHDL 2.8 03/03/2018

## 2018-03-14 NOTE — Assessment & Plan Note (Signed)
Controlled, no change in medication  

## 2018-03-14 NOTE — Assessment & Plan Note (Signed)
Laura Atkins is reminded of the importance of commitment to daily physical activity for 30 minutes or more, as able and the need to limit carbohydrate intake to 30 to 60 grams per meal to help with blood sugar control.   The need to take medication as prescribed, test blood sugar as directed, and to call between visits if there is a concern that blood sugar is uncontrolled is also discussed.   Laura Atkins is reminded of the importance of daily foot exam, annual eye examination, and good blood sugar, blood pressure and cholesterol control. Currently uncontrolled and in the care of endocrinology  Diabetic Labs Latest Ref Rng & Units 03/03/2018 12/10/2017 08/19/2017 08/12/2017 05/14/2017  HbA1c 4.6 - 6.5 % - 8.5(H) - 8.2 -  Microalbumin Not Estab. ug/mL - - - - -  Micro/Creat Ratio 0.0 - 30.0 mg/g creat - - - - -  Chol <200 mg/dL 181 - - - 154  HDL >50 mg/dL 65 - - - 76  Calc LDL mg/dL (calc) 98 - - - 62  Triglycerides <150 mg/dL 86 - - - 79  Creatinine 0.44 - 1.00 mg/dL - - 1.04(H) - 0.97(H)   BP/Weight 03/13/2018 02/24/2018 02/04/2018 01/02/2018 12/10/2017 10/28/2017 62/26/3335  Systolic BP 456 256 389 373 428 768 115  Diastolic BP 64 58 65 64 62 58 47  Wt. (Lbs) 272 278 285 285.12 286.6 288.12 287  BMI 41.36 42.27 43.33 43.35 43.58 43.81 43.64  Some encounter information is confidential and restricted. Go to Review Flowsheets activity to see all data.   Foot/eye exam completion dates Latest Ref Rng & Units 12/10/2017 08/12/2017  Eye Exam No Retinopathy - -  Foot Form Completion - Done Done

## 2018-03-14 NOTE — Assessment & Plan Note (Addendum)
UnControlled, no change in medication, needs to reduce salt in diet DASH diet and commitment to daily physical activity for a minimum of 30 minutes discussed and encouraged, as a part of hypertension management. The importance of attaining a healthy weight is also discussed.  BP/Weight 03/13/2018 02/24/2018 02/04/2018 01/02/2018 12/10/2017 10/28/2017 99/37/1696  Systolic BP 789 381 017 510 258 527 782  Diastolic BP 64 58 65 64 62 58 47  Wt. (Lbs) 272 278 285 285.12 286.6 288.12 287  BMI 41.36 42.27 43.33 43.35 43.58 43.81 43.64  Some encounter information is confidential and restricted. Go to Review Flowsheets activity to see all data.

## 2018-03-14 NOTE — Assessment & Plan Note (Addendum)
Improved encouraged to  reduce intake of food Patient re-educated about  the importance of commitment to a  minimum of 150 minutes of exercise per week.  The importance of healthy food choices with portion control discussed. Encouraged to start a food diary, count calories and to consider  joining a support group. Sample diet sheets offered. Goals set by the patient for the next several months.   Weight /BMI 03/13/2018 02/24/2018 02/04/2018  WEIGHT 272 lb 278 lb 285 lb  HEIGHT 5\' 8"  5\' 8"  5\' 8"   BMI 41.36 kg/m2 42.27 kg/m2 43.33 kg/m2  Some encounter information is confidential and restricted. Go to Review Flowsheets activity to see all data.

## 2018-03-17 ENCOUNTER — Telehealth: Payer: Self-pay | Admitting: Family Medicine

## 2018-03-17 DIAGNOSIS — N3000 Acute cystitis without hematuria: Secondary | ICD-10-CM

## 2018-03-17 NOTE — Telephone Encounter (Signed)
Pt called back wanting an update.  I told her Dr Moshe Cipro said to come in and do a urin sample and we will send it in.

## 2018-03-17 NOTE — Telephone Encounter (Signed)
States she was just here for an appointment and wants to know what she should do. Urinary pressure and increased urination since Saturday. No fever. Please advise

## 2018-03-17 NOTE — Telephone Encounter (Signed)
Patient aware.

## 2018-03-17 NOTE — Addendum Note (Signed)
Addended by: Eual Fines on: 03/17/2018 04:30 PM   Modules accepted: Orders

## 2018-03-17 NOTE — Telephone Encounter (Signed)
May send urine for c/s only

## 2018-03-17 NOTE — Telephone Encounter (Signed)
Pt is calling in believes she has a UTI , lots of pressure. Started on Saturday evening, [facility]

## 2018-03-18 DIAGNOSIS — N3000 Acute cystitis without hematuria: Secondary | ICD-10-CM | POA: Diagnosis not present

## 2018-03-20 LAB — URINE CULTURE
MICRO NUMBER:: 90759398
SPECIMEN QUALITY:: ADEQUATE

## 2018-03-21 ENCOUNTER — Other Ambulatory Visit: Payer: Self-pay | Admitting: Family Medicine

## 2018-03-21 MED ORDER — CIPROFLOXACIN HCL 500 MG PO TABS: ORAL_TABLET | ORAL | 0 refills | Status: DC

## 2018-03-21 NOTE — Progress Notes (Signed)
cipro

## 2018-03-25 ENCOUNTER — Telehealth: Payer: Self-pay | Admitting: Endocrinology

## 2018-03-25 ENCOUNTER — Other Ambulatory Visit: Payer: Self-pay

## 2018-03-25 MED ORDER — INSULIN PEN NEEDLE 31G X 8 MM MISC: 99 refills | Status: DC

## 2018-03-25 NOTE — Telephone Encounter (Signed)
insulin NPH-regular Human (NOVOLIN 70/30) (70-30) 100 UNIT/ML injection   Patient needs insulin sent into the pharmacy she is also needing her pen needles sent.    [place], [place]

## 2018-03-25 NOTE — Telephone Encounter (Signed)
I have sent to patient;'s pharmacy.  

## 2018-03-26 ENCOUNTER — Other Ambulatory Visit: Payer: Self-pay | Admitting: Family Medicine

## 2018-04-11 ENCOUNTER — Encounter: Payer: Self-pay | Admitting: Endocrinology

## 2018-04-11 ENCOUNTER — Ambulatory Visit: Payer: PPO | Admitting: Endocrinology

## 2018-04-11 VITALS — BP 138/60 | HR 80 | Wt 279.0 lb

## 2018-04-11 DIAGNOSIS — E1169 Type 2 diabetes mellitus with other specified complication: Secondary | ICD-10-CM

## 2018-04-11 DIAGNOSIS — E669 Obesity, unspecified: Secondary | ICD-10-CM | POA: Diagnosis not present

## 2018-04-11 LAB — POCT GLYCOSYLATED HEMOGLOBIN (HGB A1C): Hemoglobin A1C: 7.7 % — AB (ref 4.0–5.6)

## 2018-04-11 MED ORDER — INSULIN NPH ISOPHANE & REGULAR (70-30) 100 UNIT/ML ~~LOC~~ SUSP: SUBCUTANEOUS | 11 refills | Status: DC

## 2018-04-11 NOTE — Progress Notes (Signed)
Subjective:    Patient ID: Laura Atkins, female    DOB: [birth year], 77 y.o.   MRN: [id number]  HPI Pt returns for f/u of diabetes mellitus: DM type: insulin-requiring type 2.   Dx'ed: 4332 Complications: polyneuropathy, PAD, and foot ulcer.   Therapy: insulin since 2005.   GDM: never.  DKA: never Severe hypoglycemia: never.  Pancreatitis: never.   Other info: in early 2016, she changed to BID premixed insulin, due to poor results with multiple daily injections.  She takes human insulin, due to cost.   Interval history: she brings a record of her cbg's which I have reviewed today.  It varies from 66-218.  cbg's are seldom low, and these episodes are mild, and happen in the early hrs of the morning.  She takes 50 units qam, and 40-50 units qpm.  Past Medical History:  Diagnosis Date  . Alpha thalassemia trait 01/26/2010   02/2012: Nl CBC ex H&H-10.7/34.8, MCV-69   . Anemia   . Anxiety   . Anxiety and depression   . Cellulitis 05/09/2017  . Depression   . Diabetes mellitus   . GERD (gastroesophageal reflux disease)   . Headache(784.0)   . Hyperlipidemia   . Hypertension   . Iron deficiency 01/21/2017  . Microcytic anemia 01/26/2010   02/2012: Nl CBC ex H&H-10.7/34.8, MCV-69   . Obesity   . Obstructive sleep apnea   . Osteoarthritis    Left knee; right shoulder; chronic neck and back pain  . Pruritus   . Seizures ([place])   . Tremor    This started months ago after her seizure progressing to very poor hand writing  . Urinary incontinence   . UTI (urinary tract infection) 01/18/2013    Past Surgical History:  Procedure Laterality Date  . ABDOMINAL HYSTERECTOMY    . BREAST EXCISIONAL BIOPSY     Left; cyst  . CATARACT EXTRACTION Right    12/2017  . CATARACT EXTRACTION W/ INTRAOCULAR LENS IMPLANT Left 09/07/2013  . CHOLECYSTECTOMY    . COLONOSCOPY    . COLONOSCOPY N/A 07/20/2015   Procedure: COLONOSCOPY;  Surgeon: Rogene Houston, MD;  Location: [facility];  Service:  Endoscopy;  Laterality: N/A;  930  . EYE SURGERY Left 09/07/2013   cataract    Social History   Socioeconomic History  . Marital status: Married    Spouse name: saunders  . Number of children: 5  . Years of education: 62  . Highest education level: Not on file  Occupational History  . Occupation: Disabled     Employer: RETIRED  Social Needs  . Financial resource strain: Not hard at all  . Food insecurity:    Worry: Never true    Inability: Never true  . Transportation needs:    Medical: No    Non-medical: No  Tobacco Use  . Smoking status: Never Smoker  . Smokeless tobacco: Never Used  Substance and Sexual Activity  . Alcohol use: No    Alcohol/week: 0.0 oz  . Drug use: No  . Sexual activity: Yes    Birth control/protection: Surgical  Lifestyle  . Physical activity:    Days per week: 0 days    Minutes per session: 0 min  . Stress: Only a little  Relationships  . Social connections:    Talks on phone: Twice a week    Gets together: Twice a week    Attends religious service: More than 4 times per year    Active member  of club or organization: Yes    Attends meetings of clubs or organizations: More than 4 times per year    Relationship status: Married  . Intimate partner violence:    Fear of current or ex partner: No    Emotionally abused: No    Physically abused: No    Forced sexual activity: No  Other Topics Concern  . Not on file  Social History Narrative   Patient lives at home with her husband [name].  Patient is retired.    Right handed.    Five Children.    Caffeine- 2 daily    Current Outpatient Medications on File Prior to Visit  Medication Sig Dispense Refill  . acetaminophen (EXTRA STRENGTH PAIN RELIEF) 500 MG tablet Take 500 mg by mouth every 6 (six) hours as needed for pain.    Marland Kitchen amLODipine (NORVASC) 10 MG tablet TAKE 1 TABLET BY MOUTH EVERY DAY 30 tablet 0  . aspirin 81 MG tablet Take 81 mg by mouth daily.      Marland Kitchen atorvastatin (LIPITOR) 40  MG tablet TAKE 1 TABLET BY MOUTH EVERY DAY 30 tablet 0  . benazepril (LOTENSIN) 20 MG tablet TAKE 1 TABLET BY MOUTH EVERY DAY 30 tablet 0  . ciprofloxacin (CIPRO) 500 MG tablet One tablet two times daily for 5 days 10 tablet 0  . diclofenac (VOLTAREN) 75 MG EC tablet Take 1 tablet (75 mg total) by mouth 2 (two) times daily. 30 tablet 0  . diphenhydrAMINE (BENADRYL) 25 mg capsule Take 25 mg by mouth at bedtime. Reported on 10/18/2015    . gabapentin (NEURONTIN) 400 MG capsule TAKE ONE CAPSULE BY MOUTH EVERY MORNING and TAKE ONE CAPSULE AT NOON and TAKE TWO CAPSULES AT BEDTIME 360 capsule 1  . Insulin Pen Needle 31G X 8 MM MISC Used to give daily insulin injections. 100 each prn  . Multiple Vitamin (MULTIVITAMIN) capsule Take 1 capsule by mouth daily.    Marland Kitchen omeprazole (PRILOSEC) 20 MG capsule TAKE ONE CAPSULE BY MOUTH EVERY DAY 30 capsule 0  . ONE TOUCH ULTRA TEST test strip USE TO TEST BLOOD SUGAR FOUR TIMES DAILY 360 each 3  . potassium chloride SA (K-DUR,KLOR-CON) 20 MEQ tablet TAKE ONE TABLET BY MOUTH TWICE DAILY 180 tablet 1  . sertraline (ZOLOFT) 100 MG tablet Take 1 tablet (100 mg total) by mouth 2 (two) times daily. 60 tablet 5  . torsemide (DEMADEX) 20 MG tablet TAKE TWO TABLETS DAILY (STOP LASIX) (Patient taking differently: TAKE TWO TABLETS 20MG   DAILY (STOP LASIX)) 180 tablet 3  . traMADol (ULTRAM) 50 MG tablet Take 1 tablet (50 mg total) by mouth every 8 (eight) hours as needed. 30 tablet 0  . UNABLE TO FIND Walker x 1  DX unsteady gait, osteoarthritis of left knee 1 each 0  . UNABLE TO FIND Elevated [place] Seat x 1 DX: unsteady gait, back pain, osteoarthritis of left knee 1 each 0   No current facility-administered medications on file prior to visit.     Allergies  Allergen Reactions  . Penicillins Shortness Of Breath, Itching and Rash  . Prednisone Shortness Of Breath, Itching and Rash  . Propoxyphene N-Acetaminophen Itching and Nausea And Vomiting    Family History  Problem  Relation Age of Onset  . Lung cancer Mother 85  . Kidney disease Father   . Diabetes Sister   . Keloids Brother   . ADD / ADHD Grandchild   . Bipolar disorder Grandchild   . Bipolar disorder  Daughter   . Seizures Daughter   . Heart disease Daughter   . Kidney disease Son   . Neuropathy Son   . Kidney disease Son   . Edema Daughter   . Breast cancer Daughter 3  . Allergies Daughter   . Alcohol abuse Neg Hx   . Drug abuse Neg Hx     BP 138/60 (BP Location: Left Arm, Patient Position: Sitting, Cuff Size: Normal)   Pulse 80   Wt 279 lb (126.6 kg)   SpO2 97%   BMI 42.42 kg/m    Review of Systems Denies LOC    Objective:   Physical Exam VITAL SIGNS:  See vs page GENERAL: no distress.  Morbid obesity.  Pulses: DP pulses are present bilaterally.  MSK: no deformity of the feet or ankles.  CV: 1+ bilat edema of the legs.  Skin:  no ulcer on the feet or ankles, but there is a heavy callus at the post aspect of the right heel. normal color and temp on the feet and ankles.  Neuro: sensation is intact to touch on the feet and ankles.   IWL:NLGXQ is bilateral onychomycosis of the toenails.     Lab Results  Component Value Date   HGBA1C 7.7 (A) 04/11/2018   Lab Results  Component Value Date   CREATININE 1.04 (H) 08/19/2017   BUN 18 08/19/2017   NA 138 08/19/2017   K 4.5 08/19/2017   CL 99 (L) 08/19/2017   CO2 30 08/19/2017       Assessment & Plan:  Insulin-requiring type 2 DM, with PAD: this is the best control this pt should aim for, given this regimen, which does match insulin to her changing needs throughout the day.   Patient Instructions  A diabetes blood test is requested for you today.  We'll let you know about the results.   check your blood sugar twice a day.  vary the time of day when you check, between before the 3 meals, and at bedtime.  also check if you have symptoms of your blood sugar being too high or too low.  please keep a record of the readings  and bring it to your next appointment here (or you can bring the meter itself).  You can write it on any piece of paper.  please call us sooner if your blood sugar goes below 70, or if you have a lot of readings over 200. Please change the insulin to 70 units with breakfast, and 20 units with supper.   Please come back for a follow-up appointment in 4 months.

## 2018-04-11 NOTE — Patient Instructions (Addendum)
A diabetes blood test is requested for you today.  We'll let you know about the results.   check your blood sugar twice a day.  vary the time of day when you check, between before the 3 meals, and at bedtime.  also check if you have symptoms of your blood sugar being too high or too low.  please keep a record of the readings and bring it to your next appointment here (or you can bring the meter itself).  You can write it on any piece of paper.  please call us sooner if your blood sugar goes below 70, or if you have a lot of readings over 200. Please change the insulin to 70 units with breakfast, and 20 units with supper.   Please come back for a follow-up appointment in 4 months.

## 2018-04-28 ENCOUNTER — Telehealth (INDEPENDENT_AMBULATORY_CARE_PROVIDER_SITE_OTHER): Payer: PPO

## 2018-04-28 ENCOUNTER — Ambulatory Visit (INDEPENDENT_AMBULATORY_CARE_PROVIDER_SITE_OTHER): Payer: PPO | Admitting: Family Medicine

## 2018-04-28 ENCOUNTER — Other Ambulatory Visit: Payer: Self-pay

## 2018-04-28 ENCOUNTER — Encounter: Payer: Self-pay | Admitting: Family Medicine

## 2018-04-28 VITALS — BP 130/78 | HR 76 | Resp 12 | Ht 68.0 in | Wt 278.0 lb

## 2018-04-28 DIAGNOSIS — E1169 Type 2 diabetes mellitus with other specified complication: Secondary | ICD-10-CM

## 2018-04-28 DIAGNOSIS — M79671 Pain in right foot: Secondary | ICD-10-CM | POA: Diagnosis not present

## 2018-04-28 DIAGNOSIS — E669 Obesity, unspecified: Secondary | ICD-10-CM | POA: Diagnosis not present

## 2018-04-28 DIAGNOSIS — F329 Major depressive disorder, single episode, unspecified: Secondary | ICD-10-CM | POA: Diagnosis not present

## 2018-04-28 DIAGNOSIS — F419 Anxiety disorder, unspecified: Secondary | ICD-10-CM | POA: Diagnosis not present

## 2018-04-28 DIAGNOSIS — F32A Depression, unspecified: Secondary | ICD-10-CM

## 2018-04-28 DIAGNOSIS — I10 Essential (primary) hypertension: Secondary | ICD-10-CM | POA: Diagnosis not present

## 2018-04-28 DIAGNOSIS — F331 Major depressive disorder, recurrent, moderate: Secondary | ICD-10-CM

## 2018-04-28 MED ORDER — KETOROLAC TROMETHAMINE 60 MG/2ML IM SOLN
60.0000 mg | Freq: Once | INTRAMUSCULAR | Status: AC
  Administered 2018-04-28: 60 mg via INTRAMUSCULAR

## 2018-04-28 NOTE — Patient Instructions (Addendum)
Please keep appointment for your Physical exam  I am referring you to Therapist and having you speak weith the therapist because you need to return to therapy and Dr Harrington Challenger will likely see you sooner  Please call with the name of the Podiatrist that you want, toradol is administered today for pain

## 2018-04-28 NOTE — Progress Notes (Signed)
Laura Atkins     MRN: [id number]      DOB: [birth year]   HPI Laura Atkins is here for follow up and re-evaluation of chronic medical conditions, medication management and review of any available recent lab and radiology data.  Preventive health is updated, specifically  Cancer screening and Immunization.   Questions or concerns regarding consultations or procedures which the PT has had in the interim are  addressed. The PT denies any adverse reactions to current medications since the last visit.  Patient states she is not doing very well since losing a close family member/ cousin 1  Week ago to cancer which  had  Been dx 9 yearas prior but he had decompensated in te past 90 days C/o right foot pain and is stil;l trying to decide on the Podiatrist that she wants to be referred to  ROS Denies recent fever or chills. Denies sinus pressure, nasal congestion, ear pain or sore throat. Denies chest congestion, productive cough or wheezing. Denies chest pains, palpitations and leg swelling Denies abdominal pain, nausea, vomiting,diarrhea or constipation.   Denies dysuria, frequency, hesitancy or incontinence.  Denies headaches, seizures, numbness, or tingling. Denies skin break down or rash.   PE  BP (!) 138/54 (BP Location: Left Arm, Patient Position: Sitting, Cuff Size: Large)   Pulse 76   Resp 12   Ht 5\' 8"  (1.727 m)   Wt 278 lb (126.1 kg)   SpO2 98% Comment: room air  BMI 42.27 kg/m   Patient alert and oriented and in no cardiopulmonary distress.  HEENT: No facial asymmetry, EOMI,   oropharynx pink and moist.  Neck supple no JVD, no mass.  Chest: Clear to auscultation bilaterally.  CVS: S1, S2 no murmurs, no S3.Regular rate.  ABD: Soft non tender.   Ext: No edema  MS: decreased  ROM spine, shoulders, hips and knees.  Skin: Intact, no ulcerations or rash noted.  Psych: Good eye contact, flat affect. Memory intact mildly anxious and also  depressed appearing.  CNS: CN  2-12 intact, power,  normal throughout.no focal deficits noted.   Assessment & Plan  Anxiety and depression Spouse notes increasing decompensation in patient's level of function in the home in the past approx 2 months. She is increasingly socially withdrawn and not engaged in family affairs as she usually is. Increased stress since loss of a very close friend causing even further decompensation. Needs to return to therapy and may be able to see psychiatrist sooner based on high PHQ 9 score  Morbid obesity Deteriorated. Patient re-educated about  the importance of commitment to a  minimum of 150 minutes of exercise per week.  The importance of healthy food choices with portion control discussed. Encouraged to start a food diary, count calories and to consider  joining a support group. Sample diet sheets offered. Goals set by the patient for the next several months.   Weight /BMI 04/28/2018 04/11/2018 03/13/2018  WEIGHT 278 lb 279 lb 272 lb  HEIGHT 5\' 8"  - 5\' 8"   BMI 42.27 kg/m2 42.42 kg/m2 41.36 kg/m2  Some encounter information is confidential and restricted. Go to Review Flowsheets activity to see all data.      Essential hypertension Controlled, no change in medication DASH diet and commitment to daily physical activity for a minimum of 30 minutes discussed and encouraged, as a part of hypertension management. The importance of attaining a healthy weight is also discussed.  BP/Weight 04/28/2018 04/11/2018 03/13/2018 02/24/2018 02/04/2018 01/02/2018 12/10/2017  Systolic BP 268 341 962 229 798 921 194  Diastolic BP 78 60 64 58 65 64 62  Wt. (Lbs) 278 279 272 278 285 285.12 286.6  BMI 42.27 42.42 41.36 42.27 43.33 43.35 43.58  Some encounter information is confidential and restricted. Go to Review Flowsheets activity to see all data.       Diabetes mellitus type 2 in obese [facility]) [name] is reminded of the importance of commitment to daily physical activity for 30 minutes or more, as  able and the need to limit carbohydrate intake to 30 to 60 grams per meal to help with blood sugar control.   The need to take medication as prescribed, test blood sugar as directed, and to call between visits if there is a concern that blood sugar is uncontrolled is also discussed.   [name] is reminded of the importance of daily foot exam, annual eye examination, and good blood sugar, blood pressure and cholesterol control. Improved, managed by endo  Diabetic Labs Latest Ref Rng & Units 04/11/2018 03/03/2018 12/10/2017 08/19/2017 08/12/2017  HbA1c 4.0 - 5.6 % 7.7(A) - 8.5(H) - 8.2  Microalbumin Not Estab. ug/mL - - - - -  Micro/Creat Ratio 0.0 - 30.0 mg/g creat - - - - -  Chol <200 mg/dL - 181 - - -  HDL >50 mg/dL - 65 - - -  Calc LDL mg/dL (calc) - 98 - - -  Triglycerides <150 mg/dL - 86 - - -  Creatinine 0.44 - 1.00 mg/dL - - - 1.04(H) -   BP/Weight 04/28/2018 04/11/2018 03/13/2018 02/24/2018 02/04/2018 01/02/2018 1/74/0814  Systolic BP 481 856 314 970 263 785 885  Diastolic BP 78 60 64 58 65 64 62  Wt. (Lbs) 278 279 272 278 285 285.12 286.6  BMI 42.27 42.42 41.36 42.27 43.33 43.35 43.58  Some encounter information is confidential and restricted. Go to Review Flowsheets activity to see all data.   Foot/eye exam completion dates Latest Ref Rng & Units 04/11/2018 12/10/2017  Eye Exam No Retinopathy - -  Foot Form Completion - Done Done        Foot pain, right C/o pain in right foot. Exam negative for skin breakdown or infection No recent trauma. Needs to establish with a Podiatrist as a diabetic for foot care, patient still determining whoich Provider she wants to establish with and is to call back

## 2018-04-28 NOTE — BH Specialist Note (Signed)
[place] Initial Clinical Assessment  MRN: [id number] NAME: Laura Atkins Date: 04/28/18  Total time: 1 hour  Type of Contact: Type of Contact: Video Visit Initial Contact Patient consent obtained: Patient consent obtained for Virtual Visit: (NA) Reason for Visit today: Reason for Your Call/Visit Today: Video VBH Intke Assessment   Treatment History Patient recently received Inpatient Treatment: Have You Recently Been in Any Inpatient Treatment (Hospital/Detox/Crisis Center/28-Day Program)?: No  Facility/Program:  None Reported  Date of discharge:  None Reported  Patient currently being seen by therapist/psychiatrist: Yes - Dr. Shane Crutch - last appt in March 2019  Patient currently receiving the following services: Yes - Medication Management    Psychiatric History  Past Psychiatric History/Hospitalization(s): Anxiety: Yes Bipolar Disorder: No Depression: Yes Mania: No Psychosis: No Schizophrenia: No Personality Disorder: No Hospitalization for psychiatric illness: No History of Electroconvulsive Shock Therapy: No Prior Suicide Attempts: No Decreased need for sleep: No  Euphoria: No Self Injurious behaviors No Family History of mental illness: No Family History of substance abuse: No  Substance Abuse: No  DUI: No  Insomnia: No  History of violence No  Physical, sexual or emotional abuse:No  Prior outpatient mental health therapy: Yes - Dr. Harrington Challenger and Maurice Small       Clinical Assessment:  PHQ-9 Assessments: Depression screen [facility] 2/9 04/28/2018 04/28/2018 02/24/2018  Decreased Interest 3 0 0  Down, Depressed, Hopeless 3 0 0  PHQ - 2 Score 6 0 0  Altered sleeping 3 - -  Tired, decreased energy 3 - -  Change in appetite 2 - -  Feeling bad or failure about yourself  1 - -  Trouble concentrating 3 - -  Moving slowly or fidgety/restless 3 - -  Suicidal thoughts 0 - -  PHQ-9 Score 21 - -  Difficult doing work/chores Extremely dIfficult - -  Some  recent data might be hidden     GAD-7 Assessments: GAD 7 : Generalized Anxiety Score 04/28/2018  Nervous, Anxious, on Edge 3  Control/stop worrying 3  Worry too much - different things 3  Trouble relaxing 3  Restless 3  Easily annoyed or irritable 1  Afraid - awful might happen 2  Total GAD 7 Score 18     Social Functioning Social maturity: Social Maturity: Responsible Social judgement:  Good   Stress Current stressors: Current Stressors: (Cousin died last week; Medication is not working; Her youngest daughter has breast cancer ) Familial stressors: Familial Stressors: None Sleep: Sleep: No problems Appetite: Appetite: No problems Coping ability: Coping ability: Exhausted, Overwhelmed  Patient taking medications as prescribed: Patient taking medications as prescribed: Yes  Current medications:  Outpatient Encounter Medications as of 04/28/2018  Medication Sig  . acetaminophen (EXTRA STRENGTH PAIN RELIEF) 500 MG tablet Take 500 mg by mouth every 6 (six) hours as needed for pain.  Marland Kitchen amLODipine (NORVASC) 10 MG tablet TAKE 1 TABLET BY MOUTH EVERY DAY  . aspirin 81 MG tablet Take 81 mg by mouth daily.    Marland Kitchen atorvastatin (LIPITOR) 40 MG tablet TAKE 1 TABLET BY MOUTH EVERY DAY  . benazepril (LOTENSIN) 20 MG tablet TAKE 1 TABLET BY MOUTH EVERY DAY  . diclofenac (VOLTAREN) 75 MG EC tablet Take 1 tablet (75 mg total) by mouth 2 (two) times daily.  . diphenhydrAMINE (BENADRYL) 25 mg capsule Take 25 mg by mouth at bedtime. Reported on 10/18/2015  . gabapentin (NEURONTIN) 400 MG capsule TAKE ONE CAPSULE BY MOUTH EVERY MORNING and TAKE ONE CAPSULE AT  NOON and TAKE TWO CAPSULES AT BEDTIME  . insulin NPH-regular Human (NOVOLIN 70/30) (70-30) 100 UNIT/ML injection 70 units with breakfast, and 20 units with supper, and syringes 2/day.  . Insulin Pen Needle 31G X 8 MM MISC Used to give daily insulin injections.  . Multiple Vitamin (MULTIVITAMIN) capsule Take 1 capsule by mouth daily.  Marland Kitchen omeprazole  (PRILOSEC) 20 MG capsule TAKE ONE CAPSULE BY MOUTH EVERY DAY  . ONE TOUCH ULTRA TEST test strip USE TO TEST BLOOD SUGAR FOUR TIMES DAILY  . potassium chloride SA (K-DUR,KLOR-CON) 20 MEQ tablet TAKE ONE TABLET BY MOUTH TWICE DAILY  . sertraline (ZOLOFT) 100 MG tablet Take 1 tablet (100 mg total) by mouth 2 (two) times daily.  Marland Kitchen torsemide (DEMADEX) 20 MG tablet TAKE TWO TABLETS DAILY (STOP LASIX) (Patient taking differently: TAKE TWO TABLETS 20MG   DAILY (STOP LASIX))  . traMADol (ULTRAM) 50 MG tablet Take 1 tablet (50 mg total) by mouth every 8 (eight) hours as needed.  Marland Kitchen UNABLE TO FIND Walker x 1  DX unsteady gait, osteoarthritis of left knee  . UNABLE TO FIND Elevated [place] Seat x 1 DX: unsteady gait, back pain, osteoarthritis of left knee   No facility-administered encounter medications on file as of 11-May-2018.     Self-harm Behaviors Risk Assessment Self-harm risk factors: Self-harm risk factors: (None Reorted) Patient endorses recent thoughts of harming self: Have you recently had any thoughts about harming yourself?: No  [place] Suicide Severity Rating Scale:  C-SRSS 05/11/2018  1. Wish to be Dead No  2. Suicidal Thoughts No  6. Suicide Behavior Question No     Danger to Others Risk Assessment Danger to others risk factors: Danger to Others Risk Factors: No risk factors noted Patient endorses recent thoughts of harming others: Notification required: No need or identified person    Substance Use Assessment Patient recently consumed alcohol: Have you recently consumed alcohol?: No  Alcohol Use Disorder Identification Test (AUDIT):  Alcohol Use Disorder Test (AUDIT) May 11, 2018  1. How often do you have a drink containing alcohol? 0  2. How many drinks containing alcohol do you have on a typical day when you are drinking? 0  3. How often do you have six or more drinks on one occasion? 0  AUDIT-C Score 0  Intervention/Follow-up AUDIT Score <7 follow-up not indicated   Patient  recently used drugs: Have you recently used any drugs?: No Patient is concerned about dependence or abuse of substances: Does patient seem concerned about dependence or abuse of any substance?: No    Goals, Interventions and Follow-up Plan Goals: Increase healthy adjustment to current life circumstances Interventions: Motivational Interviewing and Supportive Counseling Follow-up Plan:  1.  VBH Phone Follow Up  2.  APPT with Maurice Small on 04-29-2018 at 11am  3.  APPT with Dr. Harrington Challenger on 04-30-2018 at 1pm    Summary of Clinical Assessment Summary:   Patient is a 77 year old female. Patient was referred to [facility] services in order to resume services with [facility] outpatient services   Dr. Moshe Cipro wants the patient to return to services with Dr. Harrington Challenger for medication management and Maurice Small for outpatient therapy.   Patient was tearful during the assessment.  Patient reports depression associated with the death of her first cousin last week.  Patient reports that her daughter has breast cancer and she is very worried that her daughter will die.  Patient reports that her mother and three aunts died of breast cancer.   Patient denies  SI/HI/Psychosis/Substance Abuse. If your symptoms worsen or you have thoughts of suicide/homicide, PLEASE SEEK IMMEDIATE MEDICAL ATTENTION.  You may always call:  National Suicide Hotline: 847-148-7272;  [place] Crisis Line: 270-417-0941;  Crisis Recovery in [place]: 548-561-5929.  These are available 24 hours a day, 7 days a week.     Graciella Freer [place], LCAS-A

## 2018-04-29 ENCOUNTER — Ambulatory Visit (INDEPENDENT_AMBULATORY_CARE_PROVIDER_SITE_OTHER): Payer: PPO | Admitting: Psychiatry

## 2018-04-29 DIAGNOSIS — F32A Depression, unspecified: Secondary | ICD-10-CM

## 2018-04-29 DIAGNOSIS — F329 Major depressive disorder, single episode, unspecified: Secondary | ICD-10-CM | POA: Diagnosis not present
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
## 2018-04-30 ENCOUNTER — Encounter (HOSPITAL_COMMUNITY): Payer: Self-pay | Admitting: Psychiatry

## 2018-04-30 ENCOUNTER — Ambulatory Visit (INDEPENDENT_AMBULATORY_CARE_PROVIDER_SITE_OTHER): Payer: PPO | Admitting: Psychiatry

## 2018-04-30 VITALS — BP 168/70 | HR 83 | Ht 68.0 in | Wt 277.0 lb

## 2018-04-30 DIAGNOSIS — F329 Major depressive disorder, single episode, unspecified: Secondary | ICD-10-CM | POA: Diagnosis not present

## 2018-04-30 DIAGNOSIS — F32A Depression, unspecified: Secondary | ICD-10-CM

## 2018-04-30 MED ORDER — ARIPIPRAZOLE 2 MG PO TABS: 2.0000 mg | ORAL_TABLET | Freq: Every day | ORAL | 2 refills | Status: DC

## 2018-04-30 MED ORDER — SERTRALINE HCL 100 MG PO TABS: 100.0000 mg | ORAL_TABLET | Freq: Two times a day (BID) | ORAL | 5 refills | Status: DC
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
## 2018-04-30 NOTE — Progress Notes (Signed)
Patient was seen by Dr. Harrington Challenger and Ms. Bynum. Will sign off.

## 2018-05-06 ENCOUNTER — Other Ambulatory Visit: Payer: Self-pay | Admitting: Family Medicine

## 2018-05-10 ENCOUNTER — Encounter: Payer: Self-pay | Admitting: Family Medicine

## 2018-05-10 NOTE — Assessment & Plan Note (Signed)
Laura Atkins is reminded of the importance of commitment to daily physical activity for 30 minutes or more, as able and the need to limit carbohydrate intake to 30 to 60 grams per meal to help with blood sugar control.   The need to take medication as prescribed, test blood sugar as directed, and to call between visits if there is a concern that blood sugar is uncontrolled is also discussed.   Laura Atkins is reminded of the importance of daily foot exam, annual eye examination, and good blood sugar, blood pressure and cholesterol control. Improved, managed by endo  Diabetic Labs Latest Ref Rng & Units 04/11/2018 03/03/2018 12/10/2017 08/19/2017 08/12/2017  HbA1c 4.0 - 5.6 % 7.7(A) - 8.5(H) - 8.2  Microalbumin Not Estab. ug/mL - - - - -  Micro/Creat Ratio 0.0 - 30.0 mg/g creat - - - - -  Chol <200 mg/dL - 181 - - -  HDL >50 mg/dL - 65 - - -  Calc LDL mg/dL (calc) - 98 - - -  Triglycerides <150 mg/dL - 86 - - -  Creatinine 0.44 - 1.00 mg/dL - - - 1.04(H) -   BP/Weight 04/28/2018 04/11/2018 03/13/2018 02/24/2018 02/04/2018 01/02/2018 11/03/4707  Systolic BP 628 366 294 765 465 035 465  Diastolic BP 78 60 64 58 65 64 62  Wt. (Lbs) 278 279 272 278 285 285.12 286.6  BMI 42.27 42.42 41.36 42.27 43.33 43.35 43.58  Some encounter information is confidential and restricted. Go to Review Flowsheets activity to see all data.   Foot/eye exam completion dates Latest Ref Rng & Units 04/11/2018 12/10/2017  Eye Exam No Retinopathy - -  Foot Form Completion - Done Done

## 2018-05-10 NOTE — Assessment & Plan Note (Signed)
Spouse notes increasing decompensation in patient's level of function in the home in the past approx 2 months. She is increasingly socially withdrawn and not engaged in family affairs as she usually is. Increased stress since loss of a very close friend causing even further decompensation. Needs to return to therapy and may be able to see psychiatrist sooner based on high PHQ 9 score

## 2018-05-10 NOTE — Assessment & Plan Note (Signed)
Deteriorated. Patient re-educated about  the importance of commitment to a  minimum of 150 minutes of exercise per week.  The importance of healthy food choices with portion control discussed. Encouraged to start a food diary, count calories and to consider  joining a support group. Sample diet sheets offered. Goals set by the patient for the next several months.   Weight /BMI 04/28/2018 04/11/2018 03/13/2018  WEIGHT 278 lb 279 lb 272 lb  HEIGHT 5\' 8"  - 5\' 8"   BMI 42.27 kg/m2 42.42 kg/m2 41.36 kg/m2  Some encounter information is confidential and restricted. Go to Review Flowsheets activity to see all data.

## 2018-05-10 NOTE — Assessment & Plan Note (Addendum)
C/o pain in right foot. Exam negative for skin breakdown or infection No recent trauma. Needs to establish with a Podiatrist as a diabetic for foot care, patient still determining whoich Provider she wants to establish with and is to call back Toradol administered in the office for pain

## 2018-05-10 NOTE — Assessment & Plan Note (Signed)
Controlled, no change in medication DASH diet and commitment to daily physical activity for a minimum of 30 minutes discussed and encouraged, as a part of hypertension management. The importance of attaining a healthy weight is also discussed.  BP/Weight 04/28/2018 04/11/2018 03/13/2018 02/24/2018 02/04/2018 01/02/2018 1/83/4373  Systolic BP 578 978 478 412 820 813 887  Diastolic BP 78 60 64 58 65 64 62  Wt. (Lbs) 278 279 272 278 285 285.12 286.6  BMI 42.27 42.42 41.36 42.27 43.33 43.35 43.58  Some encounter information is confidential and restricted. Go to Review Flowsheets activity to see all data.

## 2018-05-14 ENCOUNTER — Telehealth: Payer: Self-pay

## 2018-05-14 NOTE — Telephone Encounter (Signed)
[facility] - Inactive completed appt with Dr. Harrington Challenger and Fredricka Bonine on 04-29-2018 and 04-30-2018

## 2018-06-09 ENCOUNTER — Other Ambulatory Visit: Payer: Self-pay | Admitting: Family Medicine

## 2018-06-10 ENCOUNTER — Telehealth: Payer: Self-pay | Admitting: Family Medicine

## 2018-06-10 ENCOUNTER — Encounter (HOSPITAL_COMMUNITY): Payer: Self-pay | Admitting: Physical Therapy

## 2018-06-10 NOTE — Telephone Encounter (Signed)
Patients spouse just got over flu she says, now she has the following symptoms but no transportation.  Symptoms; stopped up, congestion, runny nose, body aches, cold chills   pharmacy: [facility] Cb#: 336/ [phone]

## 2018-06-11 ENCOUNTER — Other Ambulatory Visit: Payer: Self-pay | Admitting: Family Medicine

## 2018-06-11 MED ORDER — OSELTAMIVIR PHOSPHATE 75 MG PO CAPS: 75.0000 mg | ORAL_CAPSULE | Freq: Two times a day (BID) | ORAL | 0 refills | Status: DC

## 2018-06-11 NOTE — Progress Notes (Signed)
tamiflu

## 2018-06-11 NOTE — Telephone Encounter (Signed)
will send tamiflu to her pharmacy based on history and symptomslet her know, and if worsening go to urgent care

## 2018-06-11 NOTE — Telephone Encounter (Signed)
Left voicemail requesting call back. Patient called to say when she went to pickup her medication  It was $40 so she didn't get it but she just sent her husband back to the pharmacy to pick it up.

## 2018-06-11 NOTE — Telephone Encounter (Signed)
noted 

## 2018-06-11 NOTE — Telephone Encounter (Signed)
Husband aware.

## 2018-06-11 NOTE — Telephone Encounter (Signed)
Do you want to see her or advise urgent care?

## 2018-06-23 ENCOUNTER — Ambulatory Visit (HOSPITAL_COMMUNITY): Payer: Self-pay | Admitting: Psychiatry

## 2018-06-27 ENCOUNTER — Other Ambulatory Visit: Payer: Self-pay | Admitting: Family Medicine

## 2018-06-27 ENCOUNTER — Telehealth: Payer: Self-pay | Admitting: Family Medicine

## 2018-06-27 NOTE — Telephone Encounter (Signed)
Patient called in to request medication for sickness thinks she has an pneumonia, she doesn't know what kind of medication it is she states its the medication she got last time.

## 2018-06-29 ENCOUNTER — Other Ambulatory Visit: Payer: Self-pay | Admitting: Cardiology

## 2018-06-30 NOTE — Telephone Encounter (Signed)
Needs appt

## 2018-06-30 NOTE — Telephone Encounter (Signed)
Pt states she didn't say she had pneumonia --but felt like it.. Pt states she is feeling better, no appt needed at this time

## 2018-07-03 ENCOUNTER — Ambulatory Visit (HOSPITAL_COMMUNITY): Payer: PPO | Admitting: Psychiatry

## 2018-07-03 ENCOUNTER — Encounter (HOSPITAL_COMMUNITY): Payer: Self-pay | Admitting: Psychiatry

## 2018-07-03 VITALS — BP 150/70 | HR 79 | Ht 68.0 in | Wt 281.0 lb

## 2018-07-03 DIAGNOSIS — F329 Major depressive disorder, single episode, unspecified: Secondary | ICD-10-CM

## 2018-07-03 DIAGNOSIS — F32A Depression, unspecified: Secondary | ICD-10-CM

## 2018-07-03 MED ORDER — SERTRALINE HCL 100 MG PO TABS: 100.0000 mg | ORAL_TABLET | Freq: Two times a day (BID) | ORAL | 5 refills | Status: DC
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
## 2018-07-11 ENCOUNTER — Telehealth: Payer: Self-pay | Admitting: Family Medicine

## 2018-07-11 ENCOUNTER — Ambulatory Visit (HOSPITAL_COMMUNITY): Payer: PPO | Admitting: Psychiatry

## 2018-07-11 NOTE — Telephone Encounter (Signed)
Pt called LVM to advise that she has a bladder infection, and would like the nurse to call her, or would like something called in

## 2018-07-14 NOTE — Telephone Encounter (Signed)
Pls call pt in follow up call for [facility] info, if no appts, can submit UA and C>S to lab todaybefore I send in med IF no fever, chills or flank pain

## 2018-07-14 NOTE — Telephone Encounter (Signed)
Patient got something from the pharmacy and is feeling better

## 2018-07-15 ENCOUNTER — Other Ambulatory Visit: Payer: Self-pay | Admitting: Cardiology

## 2018-07-17 ENCOUNTER — Encounter: Payer: Self-pay | Admitting: Family Medicine

## 2018-07-17 ENCOUNTER — Ambulatory Visit (INDEPENDENT_AMBULATORY_CARE_PROVIDER_SITE_OTHER): Payer: PPO | Admitting: Family Medicine

## 2018-07-17 VITALS — BP 130/68 | HR 76 | Resp 12 | Ht 68.0 in | Wt 283.0 lb

## 2018-07-17 DIAGNOSIS — Z23 Encounter for immunization: Secondary | ICD-10-CM

## 2018-07-17 DIAGNOSIS — E7849 Other hyperlipidemia: Secondary | ICD-10-CM

## 2018-07-17 DIAGNOSIS — L98499 Non-pressure chronic ulcer of skin of other sites with unspecified severity: Secondary | ICD-10-CM | POA: Insufficient documentation

## 2018-07-17 DIAGNOSIS — I10 Essential (primary) hypertension: Secondary | ICD-10-CM

## 2018-07-17 DIAGNOSIS — L98491 Non-pressure chronic ulcer of skin of other sites limited to breakdown of skin: Secondary | ICD-10-CM

## 2018-07-17 DIAGNOSIS — M1712 Unilateral primary osteoarthritis, left knee: Secondary | ICD-10-CM

## 2018-07-17 DIAGNOSIS — Z0000 Encounter for general adult medical examination without abnormal findings: Secondary | ICD-10-CM | POA: Diagnosis not present

## 2018-07-17 MED ORDER — DOXYCYCLINE HYCLATE 100 MG PO TABS: 100.0000 mg | ORAL_TABLET | Freq: Two times a day (BID) | ORAL | 0 refills | Status: DC

## 2018-07-17 NOTE — Assessment & Plan Note (Signed)
Deteriorating with uncontrolled pain and instability, needs ortho eval

## 2018-07-17 NOTE — Assessment & Plan Note (Signed)
Open ulcers under right breast x 2 for over 1 week After obtaining informed consent, the vaccine is  administered by LPN.

## 2018-07-17 NOTE — Progress Notes (Signed)
    [place]     MRN: [id number]      DOB: 1941-04-20  HPI: Patient is in for annual physical exam. Breast ulcers right x 2 for over 1 week Left knee pain and buckling Recent labs, are reviewed. Immunization is reviewed , and  updated if needed.   PE:  BP 130/68   Pulse 76   Resp 12   Ht 5\' 8"  (1.727 m)   Wt 283 lb 0.6 oz (128.4 kg)   SpO2 96% Comment: room air  BMI 43.04 kg/m   Pleasant  female, alert and oriented x 3, in no cardio-pulmonary distress. Afebrile. HEENT No facial trauma or asymetry. Sinuses non tender.  Extra occullar muscles intact,  External ears normal, tympanic membranes clear. Oropharynx moist, no exudate. Neck: decreased though adequate ROM, no adenopathy,JVD or thyromegaly.No bruits.  Chest: Clear to ascultation bilaterally.No crackles or wheezes. Non tender to palpation  Breast: No asymetry,no masses or lumps. No tenderness. No nipple discharge or inversion. No axillary or supraclavicular adenopathy Ulcers stage 1 x 2 under right breast, max diameter 1.5 in and 1 in  Cardiovascular system; Heart sounds normal,  S1 and  S2 ,no S3.  No murmur, or thrill. Apical beat not displaced Peripheral pulses normal.  Abdomen: Soft, non tender, no organomegaly or masses. No bruits. Bowel sounds normal. No guarding, tenderness or rebound.     Musculoskeletal exam:  Decreased  ROM of spine, hips , shoulders and knees. Deformity ,swelling and  crepitus noted in left knee No muscle wasting or atrophy.   Neurologic: Cranial nerves 2 to 12 intact. Power, tone ,sensation and reflexes normal throughout. No disturbance in gait. No tremor.  Skin: Intact, no ulceration, erythema , scaling or rash noted. Pigmentation normal throughout  Psych; Normal mood and affect. Judgement and concentration normal   Assessment & Plan:  Osteoarthritis of left knee Deteriorating with uncontrolled pain and instability, needs ortho eval  Skin ulcer  (HCC) Antibiotic, and TdAP, review in 2 to 3 weeks  Need for Tdap vaccination Open ulcers under right breast x 2 for over 1 week After obtaining informed consent, the vaccine is  administered by LPN.   Annual physical exam Annual exam as documented. Immunization and cancer screening needs are specifically addressed at this visit.   Need for immunization against influenza After obtaining informed consent, the vaccine is  administered by LPN.

## 2018-07-17 NOTE — Patient Instructions (Signed)
F/u in 3 to 4  weeks, call if you need me sooner  Need to keep ulcers clean and dry, change drfessing 2 to 3 times daily till better  TdAP today and antibiotic course prescribed  Flu   Vaccine today  Fasting lipid, cmp and EGFr 1 week before follow up  You will be referred to orthopedic doc re knee  Careful not to fall!

## 2018-07-17 NOTE — Assessment & Plan Note (Addendum)
Antibiotic, and TdAP, review in 2 to 3 weeks

## 2018-07-19 ENCOUNTER — Encounter: Payer: Self-pay | Admitting: Family Medicine

## 2018-07-19 NOTE — Addendum Note (Signed)
Addended by: Fayrene Helper on: 07/19/2018 04:39 PM   Modules accepted: Orders

## 2018-07-19 NOTE — Assessment & Plan Note (Signed)
After obtaining informed consent, the vaccine is  administered by LPN.  

## 2018-07-19 NOTE — Assessment & Plan Note (Signed)
Annual exam as documented. . Immunization and cancer screening needs are specifically addressed at this visit.  

## 2018-07-31 ENCOUNTER — Ambulatory Visit (INDEPENDENT_AMBULATORY_CARE_PROVIDER_SITE_OTHER): Payer: PPO | Admitting: Orthopaedic Surgery

## 2018-07-31 ENCOUNTER — Encounter (INDEPENDENT_AMBULATORY_CARE_PROVIDER_SITE_OTHER): Payer: Self-pay | Admitting: Orthopaedic Surgery

## 2018-07-31 VITALS — BP 123/75 | HR 74 | Ht 68.0 in | Wt 283.0 lb

## 2018-07-31 DIAGNOSIS — M1712 Unilateral primary osteoarthritis, left knee: Secondary | ICD-10-CM | POA: Diagnosis not present

## 2018-07-31 NOTE — Progress Notes (Signed)
Office Visit Note   Patient: [place]           Date of Birth: Feb 22, 1941           MRN: [id number] Visit Date: 07/31/2018              Requested by: Fayrene Helper, MD [address] [place], [place] [address] PCP: Fayrene Helper, MD   Assessment & Plan: Visit Diagnoses:  1. Primary osteoarthritis of left knee   2. Morbid obesity ([place])          With co-morbidities  Plan: We discussed dieting to get her BMI below 40.  She has severe knee osteoarthritis noted on x-rays 01/02/2017 with bone-on-bone changes and some valgus deformity.  Intra-articular injection performed she will watch her sugars carefully.  Hopefully she can be a little bit more active and work on dieting for weight loss.  Weight loss was discussed to help with her diabetes, hypertension, hypercholesterolemia as well as to help unload her knee.  She will watch her sugars carefully after the injection increase her insulin as needed.  Return PRN.  Office follow-up PRN.  Follow-Up Instructions: Return if symptoms worsen or fail to improve.   Orders:  No orders of the defined types were placed in this encounter.  No orders of the defined types were placed in this encounter.     Procedures: No procedures performed   Clinical Data: No additional findings.   Subjective: Chief Complaint  Patient presents with  . Left Knee - Pain    HPI 77 year old female seen with left knee pain difficulty walking.  She is a giving way grinding and catching and has been using a walker, Tylenol arthritis and Biofreeze.  Patient is requesting an injection in her knee.  She is here with her husband.  She has not had any falls recently.  A1c has dropped from 8.5 and March to 7.7 in July.  Review of Systems positive for knee osteoarthritis cervical spondylosis, anxiety and depression, type 2 diabetes, morbidly obese BMI greater than 40, sleep apnea peripheral vascular disease shoulder pain 14 point review of  systems otherwise negative as it pertains HPI.   Objective: Vital Signs: BP 123/75   Pulse 74   Ht 5\' 8"  (1.727 m)   Wt 283 lb (128.4 kg)   BMI 43.03 kg/m   Physical Exam  Constitutional: She is oriented to person, place, and time. She appears well-developed.  HENT:  Head: Normocephalic.  Right Ear: External ear normal.  Left Ear: External ear normal.  Eyes: Pupils are equal, round, and reactive to light.  Neck: No tracheal deviation present. No thyromegaly present.  Cardiovascular: Normal rate.  Pulmonary/Chest: Effort normal.  Abdominal: Soft.  Neurological: She is alert and oriented to person, place, and time.  Skin: Skin is warm and dry. Capillary refill takes less than 2 seconds.  Psychiatric: She has a normal mood and affect. Her behavior is normal.    Ortho Exam patient has more left than right knee crepitus.  Difficulty reaching full extension.  Pain with patellofemoral loading.  Knee effusion is present.  She can flex to 95 degrees and slowly can reach full extension.  She ambulates with a walker.  Left greater than right ankle edema.  Scoliosis is noted lumbar spine left lumbar curve.  No active plantar foot lesions.  Specialty Comments:  No specialty comments available.  Imaging: No results found.   PMFS History: Patient Active Problem List  Diagnosis Date Noted  . Skin ulcer ([place]) 07/17/2018  . Need for Tdap vaccination 07/17/2018  . Iron deficiency 01/21/2017  . Vitamin D deficiency 10/15/2016  . Urinary incontinence 10/15/2016  . Limited mobility 05/27/2016  . Diabetes mellitus type 2 in obese ([place]) 02/04/2016  . Sleep terror disorder 12/14/2015  . Fecal incontinence 12/13/2015  . Back pain of lumbar region with sciatica 04/15/2015  . Shoulder pain, right 04/14/2015  . Spondylosis, cervical, with myelopathy 04/14/2015  . Need for immunization against influenza 09/06/2014  . PVD (peripheral vascular disease) ([place]) 01/28/2014  . Foot pain, right  04/30/2013  . OSA (obstructive sleep apnea) 04/10/2013  . Peripheral neuropathy 03/12/2013  . Hearing loss 11/06/2012  . Annual physical exam 11/04/2012  . UNSTEADY GAIT 11/07/2010  . Alpha thalassemia trait 01/26/2010  . NECK PAIN, CHRONIC 10/21/2008  . Hyperlipemia 04/21/2008  . Morbid obesity ([place]) 04/21/2008  . Anxiety and depression 04/21/2008  . Essential hypertension 04/21/2008  . GERD 04/21/2008  . Osteoarthritis of left knee 04/21/2008  . URINARY INCONTINENCE 04/21/2008   Past Medical History:  Diagnosis Date  . Alpha thalassemia trait 01/26/2010   02/2012: Nl CBC ex H&H-10.7/34.8, MCV-69   . Anemia   . Anxiety   . Anxiety and depression   . Cellulitis 05/09/2017  . Depression   . Diabetes mellitus   . GERD (gastroesophageal reflux disease)   . Headache(784.0)   . Hyperlipidemia   . Hypertension   . Iron deficiency 01/21/2017  . Microcytic anemia 01/26/2010   02/2012: Nl CBC ex H&H-10.7/34.8, MCV-69   . Obesity   . Obstructive sleep apnea   . Osteoarthritis    Left knee; right shoulder; chronic neck and back pain  . Pruritus   . Seizures ([place])   . Tremor    This started months ago after her seizure progressing to very poor hand writing  . Urinary incontinence   . UTI (urinary tract infection) 01/18/2013    Family History  Problem Relation Age of Onset  . Lung cancer Mother 30  . Kidney disease Father   . Diabetes Sister   . Keloids Brother   . ADD / ADHD Grandchild   . Bipolar disorder Grandchild   . Bipolar disorder Daughter   . Seizures Daughter   . Heart disease Daughter   . Kidney disease Son   . Neuropathy Son   . Kidney disease Son   . Edema Daughter   . Breast cancer Daughter 49  . Allergies Daughter   . Alcohol abuse Neg Hx   . Drug abuse Neg Hx     Past Surgical History:  Procedure Laterality Date  . ABDOMINAL HYSTERECTOMY    . BREAST EXCISIONAL BIOPSY     Left; cyst  . CATARACT EXTRACTION Right    12/2017  . CATARACT EXTRACTION W/  INTRAOCULAR LENS IMPLANT Left 09/07/2013  . CHOLECYSTECTOMY    . COLONOSCOPY    . COLONOSCOPY N/A 07/20/2015   Procedure: COLONOSCOPY;  Surgeon: Rogene [place], MD;  Location: [facility];  Service: Endoscopy;  Laterality: N/A;  930  . EYE SURGERY Left 09/07/2013   cataract   Social History   Occupational History  . Occupation: Disabled     Employer: RETIRED  Tobacco Use  . Smoking status: Never Smoker  . Smokeless tobacco: Never Used  Substance and Sexual Activity  . Alcohol use: No    Alcohol/week: 0.0 standard drinks  . Drug use: No  . Sexual activity: Yes  Birth control/protection: Surgical

## 2018-08-07 ENCOUNTER — Ambulatory Visit: Payer: Self-pay | Admitting: Family Medicine

## 2018-08-14 ENCOUNTER — Ambulatory Visit: Payer: PPO | Admitting: Endocrinology

## 2018-08-14 ENCOUNTER — Encounter: Payer: Self-pay | Admitting: Endocrinology

## 2018-08-14 VITALS — BP 132/68 | HR 81 | Ht 68.0 in | Wt 286.2 lb

## 2018-08-14 DIAGNOSIS — E1169 Type 2 diabetes mellitus with other specified complication: Secondary | ICD-10-CM | POA: Diagnosis not present

## 2018-08-14 DIAGNOSIS — E669 Obesity, unspecified: Secondary | ICD-10-CM

## 2018-08-14 LAB — POCT GLYCOSYLATED HEMOGLOBIN (HGB A1C): Hemoglobin A1C: 8.6 % — AB (ref 4.0–5.6)

## 2018-08-14 MED ORDER — INSULIN NPH ISOPHANE & REGULAR (70-30) 100 UNIT/ML ~~LOC~~ SUSP: SUBCUTANEOUS | 11 refills | Status: DC

## 2018-08-14 NOTE — Progress Notes (Signed)
Subjective:    Patient ID: Laura Atkins, female    DOB: 1941/02/22, 77 y.o.   MRN: [id number]  HPI Pt returns for f/u of diabetes mellitus: DM type: insulin-requiring type 2.   Dx'ed: 0350 Complications: polyneuropathy, PAD, and foot ulcer.   Therapy: insulin since 2005.   GDM: never.  DKA: never Severe hypoglycemia: never.  Pancreatitis: never.   Other info: in early 2016, she changed to BID premixed insulin, due to poor results with multiple daily injections.  She takes human insulin, due to cost.   Interval history: she brings a record of her cbg's which I have reviewed today.  It varies from 73-400.  It is in general higher as the day goes on.  cbg's are low in the middle of the night, once per month.  She thinks she takes 50 units with breakfast, and 20 units with supper Past Medical History:  Diagnosis Date  . Alpha thalassemia trait 01/26/2010   02/2012: Nl CBC ex H&H-10.7/34.8, MCV-69   . Anemia   . Anxiety   . Anxiety and depression   . Cellulitis 05/09/2017  . Depression   . Diabetes mellitus   . GERD (gastroesophageal reflux disease)   . Headache(784.0)   . Hyperlipidemia   . Hypertension   . Iron deficiency 01/21/2017  . Microcytic anemia 01/26/2010   02/2012: Nl CBC ex H&H-10.7/34.8, MCV-69   . Obesity   . Obstructive sleep apnea   . Osteoarthritis    Left knee; right shoulder; chronic neck and back pain  . Pruritus   . Seizures ([place])   . Tremor    This started months ago after her seizure progressing to very poor hand writing  . Urinary incontinence   . UTI (urinary tract infection) 01/18/2013    Past Surgical History:  Procedure Laterality Date  . ABDOMINAL HYSTERECTOMY    . BREAST EXCISIONAL BIOPSY     Left; cyst  . CATARACT EXTRACTION Right    12/2017  . CATARACT EXTRACTION W/ INTRAOCULAR LENS IMPLANT Left 09/07/2013  . CHOLECYSTECTOMY    . COLONOSCOPY    . COLONOSCOPY N/A 07/20/2015   Procedure: COLONOSCOPY;  Surgeon: Rogene Houston, MD;   Location: [facility];  Service: Endoscopy;  Laterality: N/A;  930  . EYE SURGERY Left 09/07/2013   cataract    Social History   Socioeconomic History  . Marital status: Married    Spouse name: saunders  . Number of children: 5  . Years of education: 62  . Highest education level: Not on file  Occupational History  . Occupation: Disabled     Employer: RETIRED  Social Needs  . Financial resource strain: Not hard at all  . Food insecurity:    Worry: Never true    Inability: Never true  . Transportation needs:    Medical: No    Non-medical: No  Tobacco Use  . Smoking status: Never Smoker  . Smokeless tobacco: Never Used  Substance and Sexual Activity  . Alcohol use: No    Alcohol/week: 0.0 standard drinks  . Drug use: No  . Sexual activity: Yes    Birth control/protection: Surgical  Lifestyle  . Physical activity:    Days per week: 0 days    Minutes per session: 0 min  . Stress: Only a little  Relationships  . Social connections:    Talks on phone: Twice a week    Gets together: Twice a week    Attends religious service: More than  4 times per year    Active member of club or organization: Yes    Attends meetings of clubs or organizations: More than 4 times per year    Relationship status: Married  . Intimate partner violence:    Fear of current or ex partner: No    Emotionally abused: No    Physically abused: No    Forced sexual activity: No  Other Topics Concern  . Not on file  Social History Narrative   Patient lives at home with her husband [name].  Patient is retired.    Right handed.    Five Children.    Caffeine- 2 daily    Current Outpatient Medications on File Prior to Visit  Medication Sig Dispense Refill  . acetaminophen (EXTRA STRENGTH PAIN RELIEF) 500 MG tablet Take 500 mg by mouth every 6 (six) hours as needed for pain.    Marland Kitchen amLODipine (NORVASC) 10 MG tablet TAKE 1 TABLET BY MOUTH EVERY DAY 30 tablet 5  . aspirin 81 MG tablet Take 81 mg  by mouth daily.      Marland Kitchen atorvastatin (LIPITOR) 40 MG tablet TAKE 1 TABLET BY MOUTH EVERY DAY 30 tablet 5  . benazepril (LOTENSIN) 20 MG tablet TAKE 1 TABLET BY MOUTH EVERY DAY 30 tablet 5  . diclofenac (VOLTAREN) 75 MG EC tablet Take 1 tablet (75 mg total) by mouth 2 (two) times daily. 30 tablet 0  . diphenhydrAMINE (BENADRYL) 25 mg capsule Take 25 mg by mouth at bedtime. Reported on 10/18/2015    . doxycycline (VIBRA-TABS) 100 MG tablet Take 1 tablet (100 mg total) by mouth 2 (two) times daily. 20 tablet 0  . gabapentin (NEURONTIN) 400 MG capsule TAKE ONE CAPSULE BY MOUTH EVERY MORNING and TAKE ONE CAPSULE AT NOON and TAKE TWO CAPSULES AT BEDTIME 360 capsule 1  . Insulin Pen Needle 31G X 8 MM MISC Used to give daily insulin injections. 100 each prn  . Multiple Vitamin (MULTIVITAMIN) capsule Take 1 capsule by mouth daily.    Marland Kitchen omeprazole (PRILOSEC) 20 MG capsule TAKE ONE CAPSULE BY MOUTH EVERY DAY 30 capsule 5  . ONE TOUCH ULTRA TEST test strip USE TO TEST BLOOD SUGAR FOUR TIMES DAILY 360 each 3  . oseltamivir (TAMIFLU) 75 MG capsule Take 1 capsule (75 mg total) by mouth 2 (two) times daily. 10 capsule 0  . potassium chloride SA (K-DUR,KLOR-CON) 20 MEQ tablet TAKE ONE TABLET BY MOUTH TWICE DAILY 180 tablet 1  . sertraline (ZOLOFT) 100 MG tablet Take 1 tablet (100 mg total) by mouth 2 (two) times daily. 60 tablet 5  . torsemide (DEMADEX) 20 MG tablet TAKE TWO TABLETS BY MOUTH EVERY DAY (STOP LASIX) 14 tablet 0  . traMADol (ULTRAM) 50 MG tablet Take 1 tablet (50 mg total) by mouth every 8 (eight) hours as needed. 30 tablet 0  . UNABLE TO FIND Walker x 1  DX unsteady gait, osteoarthritis of left knee 1 each 0  . UNABLE TO FIND Elevated [place] Seat x 1 DX: unsteady gait, back pain, osteoarthritis of left knee 1 each 0   No current facility-administered medications on file prior to visit.     Allergies  Allergen Reactions  . Penicillins Shortness Of Breath, Itching and Rash  . Prednisone  Shortness Of Breath, Itching and Rash  . Propoxyphene N-Acetaminophen Itching and Nausea And Vomiting    Family History  Problem Relation Age of Onset  . Lung cancer Mother 70  . Kidney disease Father   .  Diabetes Sister   . Keloids Brother   . ADD / ADHD Grandchild   . Bipolar disorder Grandchild   . Bipolar disorder Daughter   . Seizures Daughter   . Heart disease Daughter   . Kidney disease Son   . Neuropathy Son   . Kidney disease Son   . Edema Daughter   . Breast cancer Daughter 46  . Allergies Daughter   . Alcohol abuse Neg Hx   . Drug abuse Neg Hx     BP 132/68 (BP Location: Left Arm, Patient Position: Sitting, Cuff Size: Large)   Pulse 81   Ht 5\' 8"  (1.727 m)   Wt 286 lb 3.2 oz (129.8 kg)   SpO2 92%   BMI 43.52 kg/m    Review of Systems Denies LOC    Objective:   Physical Exam VITAL SIGNS:  See vs page GENERAL: no distress.  Morbid obesity.  Pulses: DP pulses are present bilaterally.  MSK: no deformity of the feet or ankles.  CV: 1+ bilat edema of the legs.  Skin:  no ulcer on the feet or ankles, but a heavy callus is again noted at the post aspect of the right heel. normal color and temp on the feet and ankles.   Neuro: sensation is intact to touch on the feet and ankles.   ONG:EXBMW is bilateral onychomycosis of the toenails.    A1c=8.6%     Assessment & Plan:  Insulin-requiring type 2 DM, with PAD: worse Callus, persistent.  Ref podiatry  Patient Instructions  check your blood sugar twice a day.  vary the time of day when you check, between before the 3 meals, and at bedtime.  also check if you have symptoms of your blood sugar being too high or too low.  please keep a record of the readings and bring it to your next appointment here (or you can bring the meter itself).  You can write it on any piece of paper.  please call us sooner if your blood sugar goes below 70, or if you have a lot of readings over 200. Please change the insulin to 70 units  with breakfast, and 10 units with supper.   On this type of insulin schedule, you should eat meals on a regular schedule.  If a meal is missed or significantly delayed, your blood sugar could go low.  Please come back for a follow-up appointment in 2 months.

## 2018-08-14 NOTE — Patient Instructions (Addendum)
check your blood sugar twice a day.  vary the time of day when you check, between before the 3 meals, and at bedtime.  also check if you have symptoms of your blood sugar being too high or too low.  please keep a record of the readings and bring it to your next appointment here (or you can bring the meter itself).  You can write it on any piece of paper.  please call us sooner if your blood sugar goes below 70, or if you have a lot of readings over 200. Please change the insulin to 70 units with breakfast, and 10 units with supper.   On this type of insulin schedule, you should eat meals on a regular schedule.  If a meal is missed or significantly delayed, your blood sugar could go low.  Please come back for a follow-up appointment in 2 months.

## 2018-08-29 ENCOUNTER — Ambulatory Visit: Payer: PPO | Admitting: Podiatry

## 2018-09-02 DIAGNOSIS — E133553 Other specified diabetes mellitus with stable proliferative diabetic retinopathy, bilateral: Secondary | ICD-10-CM | POA: Diagnosis not present

## 2018-09-02 DIAGNOSIS — Z961 Presence of intraocular lens: Secondary | ICD-10-CM | POA: Diagnosis not present

## 2018-09-02 DIAGNOSIS — E103553 Type 1 diabetes mellitus with stable proliferative diabetic retinopathy, bilateral: Secondary | ICD-10-CM | POA: Diagnosis not present

## 2018-09-08 ENCOUNTER — Other Ambulatory Visit: Payer: Self-pay | Admitting: *Deleted

## 2018-09-08 MED ORDER — TORSEMIDE 20 MG PO TABS: ORAL_TABLET | ORAL | 0 refills | Status: DC

## 2018-09-28 ENCOUNTER — Other Ambulatory Visit: Payer: Self-pay | Admitting: Family Medicine

## 2018-10-09 ENCOUNTER — Telehealth: Payer: Self-pay | Admitting: *Deleted

## 2018-10-09 ENCOUNTER — Ambulatory Visit (INDEPENDENT_AMBULATORY_CARE_PROVIDER_SITE_OTHER): Payer: Medicare Other | Admitting: Podiatry

## 2018-10-09 ENCOUNTER — Ambulatory Visit (INDEPENDENT_AMBULATORY_CARE_PROVIDER_SITE_OTHER): Payer: Medicare Other

## 2018-10-09 ENCOUNTER — Encounter: Payer: Self-pay | Admitting: Podiatry

## 2018-10-09 VITALS — BP 159/72

## 2018-10-09 DIAGNOSIS — L89610 Pressure ulcer of right heel, unstageable: Secondary | ICD-10-CM

## 2018-10-09 DIAGNOSIS — E119 Type 2 diabetes mellitus without complications: Secondary | ICD-10-CM | POA: Diagnosis not present

## 2018-10-09 DIAGNOSIS — R234 Changes in skin texture: Secondary | ICD-10-CM

## 2018-10-09 DIAGNOSIS — M79671 Pain in right foot: Secondary | ICD-10-CM

## 2018-10-09 DIAGNOSIS — R0989 Other specified symptoms and signs involving the circulatory and respiratory systems: Secondary | ICD-10-CM

## 2018-10-09 MED ORDER — MUPIROCIN 2 % EX OINT: TOPICAL_OINTMENT | CUTANEOUS | 0 refills | Status: DC

## 2018-10-09 NOTE — Patient Instructions (Signed)
Diabetes Mellitus and Foot Care  Foot care is an important part of your health, especially when you have diabetes. Diabetes may cause you to have problems because of poor blood flow (circulation) to your feet and legs, which can cause your skin to:   Become thinner and drier.   Break more easily.   Heal more slowly.   Peel and crack.  You may also have nerve damage (neuropathy) in your legs and feet, causing decreased feeling in them. This means that you may not notice minor injuries to your feet that could lead to more serious problems. Noticing and addressing any potential problems early is the best way to prevent future foot problems.  How to care for your feet  Foot hygiene   Wash your feet daily with warm water and mild soap. Do not use hot water. Then, pat your feet and the areas between your toes until they are completely dry. Do not soak your feet as this can dry your skin.   Trim your toenails straight across. Do not dig under them or around the cuticle. File the edges of your nails with an emery board or nail file.   Apply a moisturizing lotion or petroleum jelly to the skin on your feet and to dry, brittle toenails. Use lotion that does not contain alcohol and is unscented. Do not apply lotion between your toes.  Shoes and socks   Wear clean socks or stockings every day. Make sure they are not too tight. Do not wear knee-high stockings since they may decrease blood flow to your legs.   Wear shoes that fit properly and have enough cushioning. Always look in your shoes before you put them on to be sure there are no objects inside.   To break in new shoes, wear them for just a few hours a day. This prevents injuries on your feet.  Wounds, scrapes, corns, and calluses   Check your feet daily for blisters, cuts, bruises, sores, and redness. If you cannot see the bottom of your feet, use a mirror or ask someone for help.   Do not cut corns or calluses or try to remove them with medicine.   If you  find a minor scrape, cut, or break in the skin on your feet, keep it and the skin around it clean and dry. You may clean these areas with mild soap and water. Do not clean the area with peroxide, alcohol, or iodine.   If you have a wound, scrape, corn, or callus on your foot, look at it several times a day to make sure it is healing and not infected. Check for:  ? Redness, swelling, or pain.  ? Fluid or blood.  ? Warmth.  ? Pus or a bad smell.  General instructions   Do not cross your legs. This may decrease blood flow to your feet.   Do not use heating pads or hot water bottles on your feet. They may burn your skin. If you have lost feeling in your feet or legs, you may not know this is happening until it is too late.   Protect your feet from hot and cold by wearing shoes, such as at the beach or on hot pavement.   Schedule a complete foot exam at least once a year (annually) or more often if you have foot problems. If you have foot problems, report any cuts, sores, or bruises to your health care provider immediately.  Contact a health care provider if:     You have a medical condition that increases your risk of infection and you have any cuts, sores, or bruises on your feet.   You have an injury that is not healing.   You have redness on your legs or feet.   You feel burning or tingling in your legs or feet.   You have pain or cramps in your legs and feet.   Your legs or feet are numb.   Your feet always feel cold.   You have pain around a toenail.  Get help right away if:   You have a wound, scrape, corn, or callus on your foot and:  ? You have pain, swelling, or redness that gets worse.  ? You have fluid or blood coming from the wound, scrape, corn, or callus.  ? Your wound, scrape, corn, or callus feels warm to the touch.  ? You have pus or a bad smell coming from the wound, scrape, corn, or callus.  ? You have a fever.  ? You have a red line going up your leg.  Summary   Check your feet every day  for cuts, sores, red spots, swelling, and blisters.   Moisturize feet and legs daily.   Wear shoes that fit properly and have enough cushioning.   If you have foot problems, report any cuts, sores, or bruises to your health care provider immediately.   Schedule a complete foot exam at least once a year (annually) or more often if you have foot problems.  This information is not intended to replace advice given to you by your health care provider. Make sure you discuss any questions you have with your health care provider.  Document Released: 09/07/2000 Document Revised: 10/23/2017 Document Reviewed: 10/12/2016  Elsevier Interactive Patient Education  2019 [employer]

## 2018-10-09 NOTE — Telephone Encounter (Signed)
Will fax referral to [facility] once 10/09/2018 clinicals are available.

## 2018-10-09 NOTE — Telephone Encounter (Signed)
-----   Message from Marzetta Board, DPM sent at 10/09/2018 12:24 PM EST ----- Regarding: Referral to [facility] Please refer to [facility] for evaluation of lower extremities:  Venous Insufficiency NIDDM with Dampened pedal pulses Right posterior heel pain (longstanding)  Thanks!

## 2018-10-10 ENCOUNTER — Telehealth: Payer: Self-pay | Admitting: Podiatry

## 2018-10-10 NOTE — Telephone Encounter (Signed)
I was seen by Dr. Elisha Ponder yesterday and someone called my daughter today. I want to know what's going on and what my diagnosis is.

## 2018-10-10 NOTE — Progress Notes (Addendum)
Subjective: Laura Atkins presents today with h/o diabetes, diabetic neuropathy and cc of painful right heel.  Patient states right heel is been painful for the pain month or so.  She denies any trauma to the area.  States heel is discolored and tender to touch.  She relates she has seen podiatrist in the past but did not really get an answer as to what her diagnosis was.  Past Medical History:  Diagnosis Date  . Alpha thalassemia trait 01/26/2010   02/2012: Nl CBC ex H&H-10.7/34.8, MCV-69   . Anemia   . Anxiety   . Anxiety and depression   . Cellulitis 05/09/2017  . Depression   . Diabetes mellitus   . GERD (gastroesophageal reflux disease)   . Headache(784.0)   . Hyperlipidemia   . Hypertension   . Iron deficiency 01/21/2017  . Microcytic anemia 01/26/2010   02/2012: Nl CBC ex H&H-10.7/34.8, MCV-69   . Obesity   . Obstructive sleep apnea   . Osteoarthritis    Left knee; right shoulder; chronic neck and back pain  . Pruritus   . Seizures ([place])   . Tremor    This started months ago after her seizure progressing to very poor hand writing  . Urinary incontinence   . UTI (urinary tract infection) 01/18/2013     Patient Active Problem List   Diagnosis Date Noted  . Skin ulcer ([place]) 07/17/2018  . Need for Tdap vaccination 07/17/2018  . Iron deficiency 01/21/2017  . Vitamin D deficiency 10/15/2016  . Urinary incontinence 10/15/2016  . Limited mobility 05/27/2016  . Diabetes mellitus type 2 in obese ([place]) 02/04/2016  . Sleep terror disorder 12/14/2015  . Fecal incontinence 12/13/2015  . Back pain of lumbar region with sciatica 04/15/2015  . Shoulder pain, right 04/14/2015  . Spondylosis, cervical, with myelopathy 04/14/2015  . Need for immunization against influenza 09/06/2014  . PVD (peripheral vascular disease) ([place]) 01/28/2014  . Foot pain, right 04/30/2013  . OSA (obstructive sleep apnea) 04/10/2013  . Peripheral neuropathy 03/12/2013  . Hearing loss 11/06/2012  . Annual  physical exam 11/04/2012  . UNSTEADY GAIT 11/07/2010  . Alpha thalassemia trait 01/26/2010  . NECK PAIN, CHRONIC 10/21/2008  . Hyperlipemia 04/21/2008  . Morbid obesity ([place]) 04/21/2008  . Anxiety and depression 04/21/2008  . Essential hypertension 04/21/2008  . GERD 04/21/2008  . Osteoarthritis of left knee 04/21/2008  . URINARY INCONTINENCE 04/21/2008     Past Surgical History:  Procedure Laterality Date  . ABDOMINAL HYSTERECTOMY    . BREAST EXCISIONAL BIOPSY     Left; cyst  . CATARACT EXTRACTION Right    12/2017  . CATARACT EXTRACTION W/ INTRAOCULAR LENS IMPLANT Left 09/07/2013  . CHOLECYSTECTOMY    . COLONOSCOPY    . COLONOSCOPY N/A 07/20/2015   Procedure: COLONOSCOPY;  Surgeon: Rogene Houston, MD;  Location[facility];  Service: Endoscopy;  Laterality: N/A;  930  . EYE SURGERY Left 09/07/2013   cataract    Medications reviewed.  Allergies  Allergen Reactions  . Penicillins Shortness Of Breath, Itching and Rash  . Prednisone Shortness Of Breath, Itching and Rash  . Propoxyphene N-Acetaminophen Itching and Nausea And Vomiting     Social History   Occupational History  . Occupation: Disabled     Employer: RETIRED  Tobacco Use  . Smoking status: Never Smoker  . Smokeless tobacco: Never Used  Substance and Sexual Activity  . Alcohol use: No    Alcohol/week: 0.0 standard drinks  . Drug  use: No  . Sexual activity: Yes    Birth control/protection: Surgical     Family History  Problem Relation Age of Onset  . Lung cancer Mother 74  . Kidney disease Father   . Diabetes Sister   . Keloids Brother   . ADD / ADHD Grandchild   . Bipolar disorder Grandchild   . Bipolar disorder Daughter   . Seizures Daughter   . Heart disease Daughter   . Kidney disease Son   . Neuropathy Son   . Kidney disease Son   . Edema Daughter   . Breast cancer Daughter 87  . Allergies Daughter   . Alcohol abuse Neg Hx   . Drug abuse Neg Hx      Immunization History   Administered Date(s) Administered  . H1N1 07/15/2008  . Influenza Split 06/17/2012  . Influenza Whole 07/03/2007, 06/17/2009, 06/07/2011  . Influenza, High Dose Seasonal PF 07/17/2018  . Influenza,inj,Quad PF,6+ Mos 07/13/2013, 09/06/2014, 08/10/2015, 05/23/2016, 05/22/2017  . Pneumococcal Conjugate-13 05/03/2014  . Pneumococcal Polysaccharide-23 09/08/2010  . Td 09/08/2010  . Tdap 07/17/2018     Review of systems: Positive Findings in bold print.  Constitutional:  chills, fatigue, fever, sweats, weight change Communication: Optometrist, sign Ecologist, hand writing, iPad/Android device Head: headaches, head injury Eyes: changes in vision, eye pain, glaucoma, cataracts, macular degeneration, diplopia, glare,  light sensitivity, eyeglasses or contacts, blindness Ears nose mouth throat: Hard of hearing, ringing in ears, deaf, sign language,  vertigo,   nosebleeds,  rhinitis,  cold sores, snoring, swollen glands Cardiovascular: HTN, edema, arrhythmia, pacemaker in place, defibrillator in place,  chest pain/tightness, chronic anticoagulation, blood clot, heart failure Peripheral Vascular: leg cramps, varicose veins, blood clots, lymphedema Respiratory:  difficulty breathing, denies congestion, SOB, wheezing, cough, emphysema Gastrointestinal: change in appetite or weight, abdominal pain, constipation, diarrhea, nausea, vomiting, vomiting blood, change in bowel habits, abdominal pain, jaundice, rectal bleeding, hemorrhoids, Genitourinary:  nocturia,  pain on urination,  blood in urine, Foley catheter, urinary urgency Musculoskeletal: uses mobility aid,  cramping, stiff joints, painful joints, decreased joint motion, fractures, OA, gout Skin: +changes in toenails, color change, dryness, itching, mole changes,  rash  Neurological: headaches, numbness in feet, paresthesias in feet, burning in feet, fainting,  seizures, change in speech. denies headaches, memory problems/poor historian,  cerebral palsy, weakness, paralysis Endocrine: diabetes, hypothyroidism, hyperthyroidism,  goiter, dry mouth, flushing, heat intolerance,  cold intolerance,  excessive thirst, denies polyuria,  nocturia Hematological:  easy bleeding, excessive bleeding, easy bruising, enlarged lymph nodes, on long term blood thinner, history of past transusions Allergy/immunological:  hives, eczema, frequent infections, multiple drug allergies, seasonal allergies, transplant recipient Psychiatric:  anxiety, depression, mood disorder, suicidal ideations, hallucinations   Objective: Vitals:   10/09/18 1048  BP: (!) 159/72   Vascular Examination: Capillary refill time <3 seconds x 10 digits Dorsalis pedis pulses faintly palpable b/l  Posterior tibial pulses nonpalpable b/l No digital hair x 10 digits Skin temperature gradient WNL b/l  Dermatological Examination: Skin with normal turgor, texture and tone  Toenails thickened, dystrophic with adequate length x 10   Hyperkeratotic lesion dorsal PIPJ b/l 5th digits  Posterior right heel with evidence of chronic subdermal hemorrhage.  +Area of pressure noted posteromedial heel. +fissuring noted b/l . No flocculence. No eschar. Area is warm.  Musculoskeletal: Muscle strength 5/5 to all LE muscle groups  Hammertoes 5th digit b/l  Neurological: Sensation intact with 10 gram monofilament. Vibratory sensation intact.  Xrays right foot noted with +posterior heel spur.  No evidence of foreign body. No evidence of gas in tissues.  Assessment: Painful right heel  Fissuring right heel Unstageable ulcer right heel NIDDM   Plan: 1. Discussed diagnoses and xray findings on today. 2. Gently pared deep fissured areas right heel without incident. Triple antibiotic ointment applied. She relates some degree of relief in office. Advised her not to wear open heeled shoes such as flip flops or clogs.  3. Pared corns b/l 5th digits without incident. 4. Prescription  written for Mupirocin Ointment to be applied to right heel daily. 5. Instructed her to float heel when in bed.  6. She is on antibiotics for recent dental procedure. 7. Report to ED with worsening symptoms right foot, redness, swelling, drainage, pain, fever, chills, nightsweats, nausea or vomiting. 8. Order vascular work up. 9. Follow up in 1 week.

## 2018-10-10 NOTE — Telephone Encounter (Signed)
I called pt, she states someone called her dtr, and then she spoke to them and she has an appt 11/25/2018. I told pt she has an appt with [facility] on [phone]. Pt requested I mail address of [facility] to her. I completed a [facility] referral form with pt's [facility] appt and mailed to pt.

## 2018-10-12 ENCOUNTER — Encounter: Payer: Self-pay | Admitting: Podiatry

## 2018-10-14 ENCOUNTER — Ambulatory Visit: Payer: Self-pay | Admitting: Endocrinology

## 2018-10-14 DIAGNOSIS — Z0289 Encounter for other administrative examinations: Secondary | ICD-10-CM

## 2018-10-15 ENCOUNTER — Telehealth: Payer: Self-pay | Admitting: Endocrinology

## 2018-10-15 NOTE — Telephone Encounter (Signed)
Please schedule f/u appt for next available appointment  

## 2018-10-15 NOTE — Telephone Encounter (Signed)
Patient no showed today's appt. Please advise on how to follow up. °A. No follow up necessary. °B. Follow up urgent. Contact patient immediately. °C. Follow up necessary. Contact patient and schedule visit in ___ days. °D. Follow up advised. Contact patient and schedule visit in ____weeks. ° °Would you like the NS fee to be applied to this visit? ° °

## 2018-10-15 NOTE — Telephone Encounter (Signed)
Please advise 

## 2018-10-16 ENCOUNTER — Other Ambulatory Visit: Payer: Self-pay

## 2018-10-16 DIAGNOSIS — I739 Peripheral vascular disease, unspecified: Secondary | ICD-10-CM

## 2018-10-29 ENCOUNTER — Other Ambulatory Visit: Payer: Self-pay

## 2018-10-29 DIAGNOSIS — I739 Peripheral vascular disease, unspecified: Secondary | ICD-10-CM

## 2018-11-04 ENCOUNTER — Other Ambulatory Visit: Payer: Self-pay | Admitting: Podiatry

## 2018-11-04 DIAGNOSIS — R234 Changes in skin texture: Secondary | ICD-10-CM

## 2018-11-06 ENCOUNTER — Ambulatory Visit (HOSPITAL_COMMUNITY): Payer: PPO | Admitting: Psychiatry

## 2018-11-10 ENCOUNTER — Other Ambulatory Visit: Payer: Self-pay | Admitting: Cardiology

## 2018-11-18 ENCOUNTER — Ambulatory Visit: Payer: Self-pay | Admitting: Family Medicine

## 2018-11-18 ENCOUNTER — Other Ambulatory Visit: Payer: Self-pay | Admitting: Family Medicine

## 2018-11-21 ENCOUNTER — Encounter: Payer: Self-pay | Admitting: Family Medicine

## 2018-11-21 ENCOUNTER — Ambulatory Visit (INDEPENDENT_AMBULATORY_CARE_PROVIDER_SITE_OTHER): Payer: Medicare HMO | Admitting: Family Medicine

## 2018-11-21 ENCOUNTER — Other Ambulatory Visit: Payer: Self-pay | Admitting: Cardiology

## 2018-11-21 VITALS — BP 142/74 | HR 77 | Temp 99.0°F | Resp 15 | Ht 68.0 in | Wt 263.0 lb

## 2018-11-21 DIAGNOSIS — R05 Cough: Secondary | ICD-10-CM

## 2018-11-21 DIAGNOSIS — R059 Cough, unspecified: Secondary | ICD-10-CM

## 2018-11-21 DIAGNOSIS — J22 Unspecified acute lower respiratory infection: Secondary | ICD-10-CM

## 2018-11-21 MED ORDER — LEVOFLOXACIN 500 MG PO TABS: 500.0000 mg | ORAL_TABLET | Freq: Every day | ORAL | 0 refills | Status: DC

## 2018-11-21 NOTE — Patient Instructions (Signed)
    Thank you for coming into the office today. I appreciate the opportunity to provide you with the care for your Lower respiratory infection.  Please pick up your antibiotic today and start it today. Finish the entire course of medication (7 days).  Drink plenty of water, get rest and fresh air daily.  Hope you feel better soon.  It was a pleasure to see you and I look forward to continuing to work together on your health and well-being. Please do not hesitate to call the office if you need care or have questions about your care.  Have a wonderful day and week.  With Gratitude,  Cherly Beach, DNP, AGNP-BC

## 2018-11-21 NOTE — Progress Notes (Signed)
Acute Office Visit  Subjective:    Patient ID: Laura Atkins, female    DOB: Jun 25, 1941, 78 y.o.   MRN: [id number]  Chief Complaint  Patient presents with  . Fatigue    only c/o is some SOB and some chest tightness. No cough, no sinus drainage, no bodyaches or chills    HPI Laura Atkins, is a 78 year old female patient who presents today with increased shortness of breath, chest tightness, cough.  Reports that she has been sick for almost 2 weeks come this Sunday.  Reports that she had some green-gray phlegm coming up when she coughed.  Reports that shortness of breath and tightness as some difficulty with breathing.  Denies chest pain: Heart related.  Denies pressure in chest.  Reported that she has not been around anyone sick that she knows of.  Reports mild wheezing at times.  Denies sinus congestion, pressure, fevers, chills, body aches, headache.  No history of asthma or COPD.  Unfortunately, patient was recently on amoxicillin medication secondary to some dental work.  This was in the last 2 weeks.   Does not report taking anything for this, thought it would just subside.  Has been trying to avoid other people in public.  Has stayed home from church secondary to this.  Just does not feel like she is getting better.  Felt like she might have gotten better but then started getting worse again.  Reviewed past medical history, past surgical history, past social history, allergies, medications.   Past Medical History:  Diagnosis Date  . Alpha thalassemia trait 01/26/2010   02/2012: Nl CBC ex H&H-10.7/34.8, MCV-69   . Anemia   . Anxiety   . Anxiety and depression   . Cellulitis 05/09/2017  . Depression   . Diabetes mellitus   . GERD (gastroesophageal reflux disease)   . Headache(784.0)   . Hyperlipidemia   . Hypertension   . Iron deficiency 01/21/2017  . Microcytic anemia 01/26/2010   02/2012: Nl CBC ex H&H-10.7/34.8, MCV-69   . Obesity   . Obstructive sleep apnea   . Osteoarthritis      Left knee; right shoulder; chronic neck and back pain  . Pruritus   . Seizures ([place])   . Tremor    This started months ago after her seizure progressing to very poor hand writing  . Urinary incontinence   . UTI (urinary tract infection) 01/18/2013    Past Surgical History:  Procedure Laterality Date  . ABDOMINAL HYSTERECTOMY    . BREAST EXCISIONAL BIOPSY     Left; cyst  . CATARACT EXTRACTION Right    12/2017  . CATARACT EXTRACTION W/ INTRAOCULAR LENS IMPLANT Left 09/07/2013  . CHOLECYSTECTOMY    . COLONOSCOPY    . COLONOSCOPY N/A 07/20/2015   Procedure: COLONOSCOPY;  Surgeon: Rogene Houston, MD;  Location: [facility];  Service: Endoscopy;  Laterality: N/A;  930  . EYE SURGERY Left 09/07/2013   cataract    Family History  Problem Relation Age of Onset  . Lung cancer Mother 62  . Kidney disease Father   . Diabetes Sister   . Keloids Brother   . ADD / ADHD Grandchild   . Bipolar disorder Grandchild   . Bipolar disorder Daughter   . Seizures Daughter   . Heart disease Daughter   . Kidney disease Son   . Neuropathy Son   . Kidney disease Son   . Edema Daughter   . Breast cancer Daughter 67  . Allergies  Daughter   . Alcohol abuse Neg Hx   . Drug abuse Neg Hx     Social History   Socioeconomic History  . Marital status: Married    Spouse name: Laura Atkins  . Number of children: 5  . Years of education: 67  . Highest education level: Not on file  Occupational History  . Occupation: Disabled     Employer: RETIRED  Social Needs  . Financial resource strain: Not hard at all  . Food insecurity:    Worry: Never true    Inability: Never true  . Transportation needs:    Medical: No    Non-medical: No  Tobacco Use  . Smoking status: Never Smoker  . Smokeless tobacco: Never Used  Substance and Sexual Activity  . Alcohol use: No    Alcohol/week: 0.0 standard drinks  . Drug use: No  . Sexual activity: Yes    Birth control/protection: Surgical  Lifestyle  .  Physical activity:    Days per week: 0 days    Minutes per session: 0 min  . Stress: Only a little  Relationships  . Social connections:    Talks on phone: Twice a week    Gets together: Twice a week    Attends religious service: More than 4 times per year    Active member of club or organization: Yes    Attends meetings of clubs or organizations: More than 4 times per year    Relationship status: Married  . Intimate partner violence:    Fear of current or ex partner: No    Emotionally abused: No    Physically abused: No    Forced sexual activity: No  Other Topics Concern  . Not on file  Social History Narrative   Patient lives at home with her husband Laura Atkins).  Patient is retired.    Right handed.    Five Children.    Caffeine- 2 daily    Outpatient Medications Prior to Visit  Medication Sig Dispense Refill  . acetaminophen (EXTRA STRENGTH PAIN RELIEF) 500 MG tablet Take 500 mg by mouth every 6 (six) hours as needed for pain.    Marland Kitchen amLODipine (NORVASC) 10 MG tablet TAKE 1 TABLET BY MOUTH EVERY DAY 30 tablet 5  . amoxicillin (AMOXIL) 500 MG capsule     . aspirin 81 MG tablet Take 81 mg by mouth daily.      . ASPIRIN LOW DOSE 81 MG EC tablet Take 81 mg by mouth daily.    Marland Kitchen atorvastatin (LIPITOR) 40 MG tablet TAKE 1 TABLET BY MOUTH EVERY DAY 30 tablet 5  . benazepril (LOTENSIN) 20 MG tablet TAKE 1 TABLET BY MOUTH EVERY DAY 30 tablet 5  . diclofenac (VOLTAREN) 75 MG EC tablet Take 1 tablet (75 mg total) by mouth 2 (two) times daily. 30 tablet 0  . diphenhydrAMINE (BENADRYL) 25 mg capsule Take 25 mg by mouth at bedtime. Reported on 10/18/2015    . gabapentin (NEURONTIN) 400 MG capsule TAKE ONE CAPSULE BY MOUTH EVERY MORNING and TAKE ONE CAPSULE AT NOON and TAKE TWO CAPSULES AT BEDTIME 360 capsule 1  . ibuprofen (ADVIL,MOTRIN) 800 MG tablet     . insulin NPH-regular Human (NOVOLIN 70/30) (70-30) 100 UNIT/ML injection 70 units with breakfast, and 10 units with supper, and syringes  2/day. 30 mL 11  . Insulin Pen Needle 31G X 8 MM MISC Used to give daily insulin injections. 100 each prn  . Multiple Vitamin (MULTIVITAMIN) capsule Take 1 capsule by  mouth daily.    . mupirocin ointment (BACTROBAN) 2 % Apply to right heel once daily 22 g 0  . omeprazole (PRILOSEC) 20 MG capsule TAKE ONE CAPSULE BY MOUTH EVERY DAY 30 capsule 5  . ONE TOUCH ULTRA TEST test strip USE TO TEST BLOOD SUGAR FOUR TIMES DAILY 360 each 3  . oseltamivir (TAMIFLU) 75 MG capsule Take 1 capsule (75 mg total) by mouth 2 (two) times daily. 10 capsule 0  . potassium chloride SA (K-DUR,KLOR-CON) 20 MEQ tablet TAKE 1 TABLET BY MOUTH TWICE DAILY 180 tablet 1  . sertraline (ZOLOFT) 100 MG tablet Take 1 tablet (100 mg total) by mouth 2 (two) times daily. 60 tablet 5  . torsemide (DEMADEX) 20 MG tablet TAKE TWO TABLETS BY MOUTH EVERY DAY (STOP LASIX) 7 tablet 0  . traMADol (ULTRAM) 50 MG tablet Take 1 tablet (50 mg total) by mouth every 8 (eight) hours as needed. 30 tablet 0  . UNABLE TO FIND Walker x 1  DX unsteady gait, osteoarthritis of left knee 1 each 0  . UNABLE TO FIND Elevated [place] Seat x 1 DX: unsteady gait, back pain, osteoarthritis of left knee 1 each 0   No facility-administered medications prior to visit.     Allergies  Allergen Reactions  . Penicillins Shortness Of Breath, Itching and Rash  . Prednisone Shortness Of Breath, Itching and Rash  . Propoxyphene N-Acetaminophen Itching and Nausea And Vomiting    Review of Systems  Constitutional: Positive for malaise/fatigue. Negative for chills and fever.  HENT: Negative for congestion, ear pain, sinus pain and sore throat.   Eyes: Negative.   Respiratory: Positive for cough, sputum production, shortness of breath and wheezing.   Cardiovascular: Negative for chest pain, palpitations and leg swelling.  Gastrointestinal: Negative.   Genitourinary: Negative.   Musculoskeletal: Negative for joint pain and myalgias.  Neurological: Negative for  dizziness and headaches.  All other systems reviewed and are negative.      Objective:    Physical Exam  Constitutional: She is oriented to person, place, and time. She appears well-developed and well-nourished.  HENT:  Head: Normocephalic.  Right Ear: Hearing, tympanic membrane, external ear and ear canal normal.  Left Ear: Hearing, tympanic membrane, external ear and ear canal normal.  Nose: Mucosal edema and rhinorrhea present. Right sinus exhibits no maxillary sinus tenderness and no frontal sinus tenderness. Left sinus exhibits no maxillary sinus tenderness and no frontal sinus tenderness.  Mouth/Throat: Uvula is midline and mucous membranes are normal. Posterior oropharyngeal erythema present.  Eyes: Right eye exhibits no discharge. Left eye exhibits no discharge.  Neck: Normal range of motion. Neck supple.  Cardiovascular: Normal rate, regular rhythm and normal heart sounds.  Pulmonary/Chest: Effort normal. No accessory muscle usage. No respiratory distress. She has decreased breath sounds in the right lower field and the left lower field. She has wheezes in the right middle field. She has rhonchi in the right upper field and the right middle field.  Musculoskeletal: Normal range of motion.  Lymphadenopathy:    She has no cervical adenopathy.  Neurological: She is alert and oriented to person, place, and time.  Skin: Skin is warm and dry.  Psychiatric: She has a normal mood and affect. Her behavior is normal. Judgment and thought content normal.  Nursing note and vitals reviewed.   BP (!) 142/74   Pulse 77   Temp 99 F (37.2 C) (Oral)   Resp 15   Ht 5\' 8"  (1.727 m)  Wt 263 lb (119.3 kg)   SpO2 98%   BMI 39.99 kg/m  Wt Readings from Last 3 Encounters:  11/21/18 263 lb (119.3 kg)  08/14/18 286 lb 3.2 oz (129.8 kg)  07/31/18 283 lb (128.4 kg)         Lab Results  Component Value Date   TSH 2.01 05/14/2017   Lab Results  Component Value Date   WBC 5.2  01/02/2018   HGB 9.8 (L) 01/02/2018   HCT 31.9 (L) 01/02/2018   MCV 68.0 (L) 01/02/2018   PLT 204 01/02/2018   Lab Results  Component Value Date   NA 138 08/19/2017   K 4.5 08/19/2017   CO2 30 08/19/2017   GLUCOSE 210 (H) 08/19/2017   BUN 18 08/19/2017   CREATININE 1.04 (H) 08/19/2017   BILITOT 0.3 08/19/2017   ALKPHOS 104 08/19/2017   AST 28 08/19/2017   ALT 27 08/19/2017   PROT 8.0 08/19/2017   ALBUMIN 4.0 08/19/2017   CALCIUM 9.6 08/19/2017   ANIONGAP 9 08/19/2017   Lab Results  Component Value Date   CHOL 181 03/03/2018   Lab Results  Component Value Date   HDL 65 03/03/2018   Lab Results  Component Value Date   LDLCALC 98 03/03/2018   Lab Results  Component Value Date   TRIG 86 03/03/2018   Lab Results  Component Value Date   CHOLHDL 2.8 03/03/2018   Lab Results  Component Value Date   HGBA1C 8.6 (A) 08/14/2018       Assessment & Plan:   1. Acute lower respiratory infection Signs and symptoms today in the office appear to be in line with an acute lower respiratory infection, slight concern for community-acquired pneumonia.  Due to recent use of antibiotics secondary to having dental work done.  Will require a fluroquinolone. Today patient has some mild wheezing and rhonchi in the left lung, diminished in the bases bilaterally.  Educated on the use of Levaquin, and the need to complete the full course.  Educated on getting plenty of hydration, rest, fresh air.  Educated on washing hands well.  Educated that it will take several days for medication to help her start feeling better.  Should symptoms worsen or fail to improve either during or after the antibiotic please return to the clinic.  Would consider getting an x-ray at that time.  To assure that we are not dealing with something worse.  Oxygen today in the office is 98.  No shortness of breath noted with communication.  - levofloxacin (LEVAQUIN) 500 MG tablet; Take 1 tablet (500 mg total) by mouth  daily.  Dispense: 7 tablet; Refill: 0   2. Cough Reports that she will use over-the-counter cough syrup.  Instructed not to buy along with a decongestant in it.  Educated on not using decongestants secondary to having high blood pressure.  She verbalized understanding.   Follow-up: Follow-up if symptoms do not improve, or worsen post finishing the dose of Levaquin.   Perlie Mayo, NP

## 2018-11-24 ENCOUNTER — Telehealth: Payer: Self-pay | Admitting: *Deleted

## 2018-11-24 NOTE — Telephone Encounter (Signed)
[facility] - C. [place] Crane states pt cancelled appt and did not want to reschedule.

## 2018-11-25 ENCOUNTER — Encounter: Payer: Self-pay | Admitting: Vascular Surgery

## 2018-11-25 ENCOUNTER — Encounter (HOSPITAL_COMMUNITY): Payer: Self-pay

## 2018-11-26 ENCOUNTER — Ambulatory Visit (INDEPENDENT_AMBULATORY_CARE_PROVIDER_SITE_OTHER): Payer: Medicare HMO | Admitting: Family Medicine

## 2018-11-26 ENCOUNTER — Encounter: Payer: Self-pay | Admitting: Family Medicine

## 2018-11-26 VITALS — BP 152/70 | HR 78 | Temp 98.1°F | Resp 15 | Ht 68.0 in | Wt 263.0 lb

## 2018-11-26 DIAGNOSIS — J209 Acute bronchitis, unspecified: Secondary | ICD-10-CM | POA: Diagnosis not present

## 2018-11-26 DIAGNOSIS — I10 Essential (primary) hypertension: Secondary | ICD-10-CM

## 2018-11-26 DIAGNOSIS — Z794 Long term (current) use of insulin: Secondary | ICD-10-CM

## 2018-11-26 DIAGNOSIS — E1165 Type 2 diabetes mellitus with hyperglycemia: Secondary | ICD-10-CM | POA: Diagnosis not present

## 2018-11-26 DIAGNOSIS — E559 Vitamin D deficiency, unspecified: Secondary | ICD-10-CM

## 2018-11-26 DIAGNOSIS — Z1231 Encounter for screening mammogram for malignant neoplasm of breast: Secondary | ICD-10-CM

## 2018-11-26 DIAGNOSIS — E7849 Other hyperlipidemia: Secondary | ICD-10-CM | POA: Diagnosis not present

## 2018-11-26 DIAGNOSIS — E669 Obesity, unspecified: Secondary | ICD-10-CM | POA: Diagnosis not present

## 2018-11-26 DIAGNOSIS — E1169 Type 2 diabetes mellitus with other specified complication: Secondary | ICD-10-CM | POA: Diagnosis not present

## 2018-11-26 DIAGNOSIS — F419 Anxiety disorder, unspecified: Secondary | ICD-10-CM | POA: Diagnosis not present

## 2018-11-26 DIAGNOSIS — F329 Major depressive disorder, single episode, unspecified: Secondary | ICD-10-CM

## 2018-11-26 DIAGNOSIS — F32A Depression, unspecified: Secondary | ICD-10-CM

## 2018-11-26 NOTE — Progress Notes (Signed)
ISSABELLA Atkins     MRN: [id number]      DOB: 24-Dec-1940   HPI Laura Atkins is here for follow up of recent treatment for acute bronchitis, she is still taking levaquin, reports some improvement in symptoms , but is still coughing green sputum and does not feel herself C/o  possibly unintentional weight loss,  Review of her colonoscopy shows no cancer on pathology, but there was concern re malignancy on direct exam per Dr [name] Denies polyuria, polydipsia, blurred vision , or hypoglycemic episodes.  ROS C/o recent fever or chills. Denies sinus pressure, nasal congestion, ear pain or sore throat.  Denies chest pains, palpitations and leg swelling Denies abdominal pain, nausea, vomiting,diarrhea or constipation.   Denies dysuria, frequency, hesitancy or incontinence. Denies joint pain, swelling and limitation in mobility. Denies headaches, seizures, numbness, or tingling. Denies depression, anxiety or insomnia. Denies skin break down or rash.   PE  BP (!) 152/70   Pulse 78   Temp 98.1 F (36.7 C) (Oral)   Resp 15   Ht 5\' 8"  (1.727 m)   Wt 263 lb (119.3 kg)   SpO2 96%   BMI 39.99 kg/m   Patient alert and oriented and in no cardiopulmonary distress.  HEENT: No facial asymmetry, EOMI,   oropharynx pink and moist.  Neck supple no JVD, no mass.  Chest: adequate air entry, scattered crackles and a few wheezwes CVS: S1, S2 no murmurs, no S3.Regular rate.  ABD: Soft non tender.   Ext: No edema  MS: decreased ROM spine, shoulders, hips and knees.  Skin: Intact, no ulcerations or rash noted.  Psych: Good eye contact, normal affect. Memory intact not anxious or depressed appearing.  CNS: CN 2-12 intact, power,  normal throughout.no focal deficits noted.   Assessment & Plan  Acute bronchitis Reports symptom imp[roveemtn, though lingering symptoms CXR and complete Levaquin   Essential hypertension Uncontrolled, pt does not have medication and does not know what she is  taking. Return with medication DASH diet and commitment to daily physical activity for a minimum of 30 minutes discussed and encouraged, as a part of hypertension management. The importance of attaining a healthy weight is also discussed.  BP/Weight 11/26/2018 11/21/2018 10/09/2018 08/14/2018 07/31/2018 06/16/3006 02/24/2632  Systolic BP 354 562 563 893 734 287 681  Diastolic BP 70 74 72 68 75 68 78  Wt. (Lbs) 263 263 - 286.2 283 283.04 278  BMI 39.99 39.99 - 43.52 43.03 43.04 42.27  Some encounter information is confidential and restricted. Go to Review Flowsheets activity to see all data.       Type 2 diabetes mellitus with hyperglycemia (HCC) Deteriorated, not follwoighn up  With endo as she needs to, encouraged to do this [name] is reminded of the importance of commitment to daily physical activity for 30 minutes or more, as able and the need to limit carbohydrate intake to 30 to 60 grams per meal to help with blood sugar control.   The need to take medication as prescribed, test blood sugar as directed, and to call between visits if there is a concern that blood sugar is uncontrolled is also discussed.   [name] is reminded of the importance of daily foot exam, annual eye examination, and good blood sugar, blood pressure and cholesterol control.  Diabetic Labs Latest Ref Rng & Units 11/26/2018 08/14/2018 04/11/2018 03/03/2018 12/10/2017  HbA1c <5.7 % of total Hgb 8.5(H) 8.6(A) 7.7(A) - 8.5(H)  Microalbumin mg/dL 33.9 - - - -  Micro/Creat Ratio 0.0 - 30.0 mg/g creat - - - - -  Chol <200 mg/dL 171 - - 181 -  HDL > OR = 50 mg/dL 60 - - 65 -  Calc LDL mg/dL (calc) 90 - - 98 -  Triglycerides <150 mg/dL 118 - - 86 -  Creatinine 0.60 - 0.93 mg/dL 0.79 - - - -   BP/Weight 11/26/2018 11/21/2018 10/09/2018 08/14/2018 07/31/2018 07/86/7544 05/26/99  Systolic BP 712 197 588 325 498 264 158  Diastolic BP 70 74 72 68 75 68 78  Wt. (Lbs) 263 263 - 286.2 283 283.04 278  BMI 39.99 39.99 - 43.52 43.03  43.04 42.27  Some encounter information is confidential and restricted. Go to Review Flowsheets activity to see all data.   Foot/eye exam completion dates Latest Ref Rng & Units 08/14/2018 04/11/2018  Eye Exam No Retinopathy - -  Foot Form Completion - Done Done  will advise small increase in insulin dose until seen by Endo      Morbid obesity Obesity linked with hypertension and IDDM Unchanged Patient re-educated about  the importance of commitment to a  minimum of 150 minutes of exercise per week as able.  The importance of healthy food choices with portion control discussed, as well as eating regularly and within a 12 hour window most days. The need to choose "clean , green" food 50 to 75% of the time is discussed, as well as to make water the primary drink and set a goal of 64 ounces water daily.  Encouraged to start a food diary,  and to consider  joining a support group. Sample diet sheets offered. Goals set by the patient for the next several months.   Weight /BMI 11/26/2018 11/21/2018 08/14/2018  WEIGHT 263 lb 263 lb 286 lb 3.2 oz  HEIGHT 5\' 8"  5\' 8"  5\' 8"   BMI 39.99 kg/m2 39.99 kg/m2 43.52 kg/m2  Some encounter information is confidential and restricted. Go to Review Flowsheets activity to see all data.      Anxiety and depression Treated by Psychiatry , and controlled

## 2018-11-26 NOTE — Patient Instructions (Signed)
F/U in 2 weeks with [facility], call if you need me sooner, and you need to bring medication to next visit  Please schedule June mammogram at checkout  Continue to complete the antibiotic started  Please bring ALL of your medication to next visit, your blood pressure is high  CXR TODAY please  Labs today, CBC, TSH, Vit D, HBA1C, lipid, cmp and EGFR and microalb today  Be careful not tofalll  Three stool cards to be given today, return after collecting for 3 days

## 2018-11-27 ENCOUNTER — Ambulatory Visit (HOSPITAL_COMMUNITY)
Admission: RE | Admit: 2018-11-27 | Discharge: 2018-11-27 | Disposition: A | Discharge status: Home / Self Care | Payer: Medicare HMO | Source: Ambulatory Visit | Attending: Family Medicine | Admitting: Family Medicine

## 2018-11-27 DIAGNOSIS — J209 Acute bronchitis, unspecified: Secondary | ICD-10-CM | POA: Diagnosis not present

## 2018-11-27 DIAGNOSIS — R0989 Other specified symptoms and signs involving the circulatory and respiratory systems: Secondary | ICD-10-CM | POA: Diagnosis not present

## 2018-11-27 LAB — COMPLETE METABOLIC PANEL WITH GFR
AG Ratio: 1.4 (calc) (ref 1.0–2.5)
ALT: 14 U/L (ref 6–29)
AST: 16 U/L (ref 10–35)
Albumin: 4.2 g/dL (ref 3.6–5.1)
Alkaline phosphatase (APISO): 101 U/L (ref 37–153)
BUN: 13 mg/dL (ref 7–25)
CO2: 32 mmol/L (ref 20–32)
Calcium: 9.8 mg/dL (ref 8.6–10.4)
Chloride: 102 mmol/L (ref 98–110)
Creat: 0.79 mg/dL (ref 0.60–0.93)
GFR, Est African American: 85 mL/min/{1.73_m2} (ref >=60)
GFR, Est Non African American: 73 mL/min/{1.73_m2} (ref >=60)
Globulin: 2.9 g/dL (calc) (ref 1.9–3.7)
Glucose, Bld: 178 mg/dL — ABNORMAL HIGH (ref 65–99)
Potassium: 4 mmol/L (ref 3.5–5.3)
Sodium: 141 mmol/L (ref 135–146)
Total Bilirubin: 0.4 mg/dL (ref 0.2–1.2)
Total Protein: 7.1 g/dL (ref 6.1–8.1)

## 2018-11-27 LAB — CBC
HCT: 34.7 % — ABNORMAL LOW (ref 35.0–45.0)
Hemoglobin: 10.4 g/dL — ABNORMAL LOW (ref 11.7–15.5)
MCH: 20.4 pg — ABNORMAL LOW (ref 27.0–33.0)
MCHC: 30 g/dL — ABNORMAL LOW (ref 32.0–36.0)
MCV: 68 fL — ABNORMAL LOW (ref 80.0–100.0)
Platelets: 231 10*3/uL (ref 140–400)
RBC: 5.1 10*6/uL (ref 3.80–5.10)
RDW: 15 % (ref 11.0–15.0)
WBC: 4.6 10*3/uL (ref 3.8–10.8)

## 2018-11-27 LAB — LIPID PANEL
Cholesterol: 171 mg/dL (ref <200)
HDL: 60 mg/dL (ref >=50)
LDL Cholesterol (Calc): 90 mg/dL (calc)
Non-HDL Cholesterol (Calc): 111 mg/dL (calc) (ref <130)
Total CHOL/HDL Ratio: 2.9 (calc) (ref <5.0)
Triglycerides: 118 mg/dL (ref <150)

## 2018-11-27 LAB — HEMOGLOBIN A1C
Hgb A1c MFr Bld: 8.5 % of total Hgb — ABNORMAL HIGH (ref <5.7)
Mean Plasma Glucose: 197 (calc)
eAG (mmol/L): 10.9 (calc)

## 2018-11-27 LAB — MICROALBUMIN, URINE: Microalb, Ur: 33.9 mg/dL

## 2018-11-27 LAB — VITAMIN D 25 HYDROXY (VIT D DEFICIENCY, FRACTURES): Vit D, 25-Hydroxy: 15 ng/mL — ABNORMAL LOW (ref 30–100)

## 2018-11-27 LAB — TSH: TSH: 1.12 mIU/L (ref 0.40–4.50)

## 2018-11-29 ENCOUNTER — Encounter: Payer: Self-pay | Admitting: Family Medicine

## 2018-11-29 NOTE — Assessment & Plan Note (Signed)
Treated by Psychiatry , and controlled

## 2018-11-29 NOTE — Assessment & Plan Note (Signed)
Uncontrolled, pt does not have medication and does not know what she is taking. Return with medication DASH diet and commitment to daily physical activity for a minimum of 30 minutes discussed and encouraged, as a part of hypertension management. The importance of attaining a healthy weight is also discussed.  BP/Weight 11/26/2018 11/21/2018 10/09/2018 08/14/2018 07/31/2018 76/28/3151 03/29/1606  Systolic BP 371 062 694 854 627 035 009  Diastolic BP 70 74 72 68 75 68 78  Wt. (Lbs) 263 263 - 286.2 283 283.04 278  BMI 39.99 39.99 - 43.52 43.03 43.04 42.27  Some encounter information is confidential and restricted. Go to Review Flowsheets activity to see all data.

## 2018-11-29 NOTE — Assessment & Plan Note (Signed)
Obesity linked with hypertension and IDDM Unchanged Patient re-educated about  the importance of commitment to a  minimum of 150 minutes of exercise per week as able.  The importance of healthy food choices with portion control discussed, as well as eating regularly and within a 12 hour window most days. The need to choose "clean , green" food 50 to 75% of the time is discussed, as well as to make water the primary drink and set a goal of 64 ounces water daily.  Encouraged to start a food diary,  and to consider  joining a support group. Sample diet sheets offered. Goals set by the patient for the next several months.   Weight /BMI 11/26/2018 11/21/2018 08/14/2018  WEIGHT 263 lb 263 lb 286 lb 3.2 oz  HEIGHT 5\' 8"  5\' 8"  5\' 8"   BMI 39.99 kg/m2 39.99 kg/m2 43.52 kg/m2  Some encounter information is confidential and restricted. Go to Review Flowsheets activity to see all data.

## 2018-11-29 NOTE — Assessment & Plan Note (Signed)
Reports symptom imp[roveemtn, though lingering symptoms CXR and complete Levaquin

## 2018-11-29 NOTE — Assessment & Plan Note (Signed)
Deteriorated, not follwoighn up  With endo as she needs to, encouraged to do this Laura Atkins is reminded of the importance of commitment to daily physical activity for 30 minutes or more, as able and the need to limit carbohydrate intake to 30 to 60 grams per meal to help with blood sugar control.   The need to take medication as prescribed, test blood sugar as directed, and to call between visits if there is a concern that blood sugar is uncontrolled is also discussed.   Laura Atkins is reminded of the importance of daily foot exam, annual eye examination, and good blood sugar, blood pressure and cholesterol control.  Diabetic Labs Latest Ref Rng & Units 11/26/2018 08/14/2018 04/11/2018 03/03/2018 12/10/2017  HbA1c <5.7 % of total Hgb 8.5(H) 8.6(A) 7.7(A) - 8.5(H)  Microalbumin mg/dL 33.9 - - - -  Micro/Creat Ratio 0.0 - 30.0 mg/g creat - - - - -  Chol <200 mg/dL 171 - - 181 -  HDL > OR = 50 mg/dL 60 - - 65 -  Calc LDL mg/dL (calc) 90 - - 98 -  Triglycerides <150 mg/dL 118 - - 86 -  Creatinine 0.60 - 0.93 mg/dL 0.79 - - - -   BP/Weight 11/26/2018 11/21/2018 10/09/2018 08/14/2018 07/31/2018 47/42/5956 11/30/7562  Systolic BP 332 951 884 166 063 016 010  Diastolic BP 70 74 72 68 75 68 78  Wt. (Lbs) 263 263 - 286.2 283 283.04 278  BMI 39.99 39.99 - 43.52 43.03 43.04 42.27  Some encounter information is confidential and restricted. Go to Review Flowsheets activity to see all data.   Foot/eye exam completion dates Latest Ref Rng & Units 08/14/2018 04/11/2018  Eye Exam No Retinopathy - -  Foot Form Completion - Done Done  will advise small increase in insulin dose until seen by Endo

## 2018-12-09 ENCOUNTER — Other Ambulatory Visit: Payer: Self-pay

## 2018-12-09 DIAGNOSIS — E119 Type 2 diabetes mellitus without complications: Secondary | ICD-10-CM

## 2018-12-09 DIAGNOSIS — M79671 Pain in right foot: Secondary | ICD-10-CM

## 2018-12-09 DIAGNOSIS — L89610 Pressure ulcer of right heel, unstageable: Secondary | ICD-10-CM

## 2018-12-09 DIAGNOSIS — R234 Changes in skin texture: Secondary | ICD-10-CM

## 2018-12-09 MED ORDER — MUPIROCIN 2 % EX OINT: TOPICAL_OINTMENT | CUTANEOUS | 0 refills | Status: DC

## 2018-12-10 ENCOUNTER — Ambulatory Visit: Payer: Self-pay | Admitting: Family Medicine

## 2018-12-11 ENCOUNTER — Other Ambulatory Visit: Payer: Self-pay | Admitting: Family Medicine

## 2018-12-11 ENCOUNTER — Other Ambulatory Visit: Payer: Self-pay | Admitting: Endocrinology

## 2018-12-22 ENCOUNTER — Other Ambulatory Visit: Payer: Self-pay | Admitting: Cardiology

## 2018-12-23 ENCOUNTER — Ambulatory Visit: Payer: Medicare HMO | Admitting: Family Medicine

## 2018-12-29 ENCOUNTER — Ambulatory Visit (INDEPENDENT_AMBULATORY_CARE_PROVIDER_SITE_OTHER): Payer: Medicare HMO | Admitting: Psychiatry

## 2018-12-29 ENCOUNTER — Encounter (HOSPITAL_COMMUNITY): Payer: Self-pay | Admitting: Psychiatry

## 2018-12-29 ENCOUNTER — Other Ambulatory Visit: Payer: Self-pay

## 2018-12-29 DIAGNOSIS — Z91410 Personal history of adult physical and sexual abuse: Secondary | ICD-10-CM | POA: Diagnosis not present

## 2018-12-29 DIAGNOSIS — F32A Depression, unspecified: Secondary | ICD-10-CM

## 2018-12-29 DIAGNOSIS — Z79899 Other long term (current) drug therapy: Secondary | ICD-10-CM

## 2018-12-29 DIAGNOSIS — F329 Major depressive disorder, single episode, unspecified: Secondary | ICD-10-CM

## 2018-12-29 MED ORDER — SERTRALINE HCL 100 MG PO TABS: 100.0000 mg | ORAL_TABLET | Freq: Two times a day (BID) | ORAL | 5 refills | Status: DC
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
## 2018-12-30 ENCOUNTER — Telehealth: Payer: Self-pay | Admitting: Podiatry

## 2018-12-30 DIAGNOSIS — E119 Type 2 diabetes mellitus without complications: Secondary | ICD-10-CM

## 2018-12-30 DIAGNOSIS — L89610 Pressure ulcer of right heel, unstageable: Secondary | ICD-10-CM

## 2018-12-30 DIAGNOSIS — M79671 Pain in right foot: Secondary | ICD-10-CM

## 2018-12-30 DIAGNOSIS — R234 Changes in skin texture: Secondary | ICD-10-CM

## 2018-12-30 MED ORDER — MUPIROCIN 2 % EX OINT: TOPICAL_OINTMENT | CUTANEOUS | 0 refills | Status: DC

## 2018-12-30 NOTE — Telephone Encounter (Signed)
Patient is in pain, she sent a refill request to pharmacy and she has not heard anything. Can you refill medication

## 2018-12-30 NOTE — Addendum Note (Signed)
Addended by: Harriett Sine D on: 12/30/2018 03:09 PM   Modules accepted: Orders

## 2018-12-30 NOTE — Telephone Encounter (Signed)
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 I told pt due to our office currently only seeing urgent care problems if she felt the mupirocin helped I would order again, but we would like to see her in a month, if her situation worsened she should call for an earlier appt. Pt stated she was to have an appt some where else but had to cancel. I reviewed chart and she was to have ABI with TBI, and I gave her the [facility] [address], [place] to reschedule and she stated she would do so.

## 2018-12-30 NOTE — Telephone Encounter (Signed)
Left message with rx for mupirocin ointment refill.

## 2019-01-08 ENCOUNTER — Ambulatory Visit: Payer: Medicare Other | Admitting: Podiatry

## 2019-01-14 ENCOUNTER — Other Ambulatory Visit: Payer: Self-pay | Admitting: Cardiology

## 2019-01-22 ENCOUNTER — Ambulatory Visit: Payer: Medicare HMO | Admitting: Family Medicine

## 2019-01-26 ENCOUNTER — Ambulatory Visit: Payer: Medicare HMO | Admitting: Family Medicine

## 2019-01-30 ENCOUNTER — Ambulatory Visit: Payer: Medicare HMO | Admitting: Family Medicine

## 2019-02-03 ENCOUNTER — Other Ambulatory Visit: Payer: Self-pay

## 2019-02-03 ENCOUNTER — Telehealth: Payer: Self-pay | Admitting: *Deleted

## 2019-02-03 DIAGNOSIS — M79671 Pain in right foot: Secondary | ICD-10-CM

## 2019-02-03 DIAGNOSIS — L89610 Pressure ulcer of right heel, unstageable: Secondary | ICD-10-CM

## 2019-02-03 DIAGNOSIS — I739 Peripheral vascular disease, unspecified: Secondary | ICD-10-CM

## 2019-02-03 DIAGNOSIS — E119 Type 2 diabetes mellitus without complications: Secondary | ICD-10-CM

## 2019-02-03 DIAGNOSIS — R234 Changes in skin texture: Secondary | ICD-10-CM

## 2019-02-03 MED ORDER — MUPIROCIN 2 % EX OINT: TOPICAL_OINTMENT | CUTANEOUS | 1 refills | Status: DC

## 2019-02-03 MED ORDER — MUPIROCIN 2 % EX OINT: TOPICAL_OINTMENT | CUTANEOUS | 1 refills | Status: AC

## 2019-02-03 NOTE — Telephone Encounter (Signed)
Unable to leave a message voicemail service is not set up.

## 2019-02-03 NOTE — Telephone Encounter (Signed)
I called pt's home phone, I informed pt's husband, [name] I needed to speak to pt concerning a refill of mupirocin ointment. I spoke with pt and informed her Dr. Elisha Ponder wanted her to have her circulation checked, because it was part of the cause for the problem she was having with the skin on her feet and the fissure. Pt states she would reschedule with [facility] and I told pt I would ask Dr. Elisha Ponder to refill the Mupirocin ointment and send a new referral to [facility].

## 2019-02-03 NOTE — Telephone Encounter (Signed)
Faxed new referral, clinicals and demographics to [facility].

## 2019-02-03 NOTE — Telephone Encounter (Signed)
Refill request for mupirocin ointment 2%. Dr. Elisha Ponder states pt needs to be seen for vascular consultation.

## 2019-02-03 NOTE — Telephone Encounter (Signed)
I spoke with pt's dtr, Suanne Marker, I informed her I was trying to contact pt concerning a medication refill and an appt with cardiovascular consultation. Suanne Marker states she will contact pt to have pt pick up when I called.

## 2019-02-06 ENCOUNTER — Other Ambulatory Visit: Payer: Self-pay

## 2019-02-06 ENCOUNTER — Ambulatory Visit (INDEPENDENT_AMBULATORY_CARE_PROVIDER_SITE_OTHER): Payer: Medicare HMO | Admitting: Family Medicine

## 2019-02-06 ENCOUNTER — Telehealth (INDEPENDENT_AMBULATORY_CARE_PROVIDER_SITE_OTHER): Payer: Medicare HMO

## 2019-02-06 ENCOUNTER — Encounter: Payer: Self-pay | Admitting: Family Medicine

## 2019-02-06 VITALS — BP 140/65 | HR 72 | Temp 98.7°F | Resp 14 | Ht 68.0 in | Wt 268.4 lb

## 2019-02-06 DIAGNOSIS — F331 Major depressive disorder, recurrent, moderate: Secondary | ICD-10-CM

## 2019-02-06 DIAGNOSIS — F32A Depression, unspecified: Secondary | ICD-10-CM

## 2019-02-06 DIAGNOSIS — F329 Major depressive disorder, single episode, unspecified: Secondary | ICD-10-CM

## 2019-02-06 DIAGNOSIS — F419 Anxiety disorder, unspecified: Secondary | ICD-10-CM

## 2019-02-06 NOTE — Progress Notes (Signed)
Subjective:     Patient ID: Laura Atkins, female   DOB: 1941-05-06, 78 y.o.   MRN: [id number]  Laura Atkins presents for Depression  [name] is a 78 year old female patient of Dr. Griffin Dakin.  Who is well-known to the clinic.  Who presents today with her medications.  Medications were reviewed no medications were stopped.  She reports that she is feeling anxious and depressed.  Does not know what to do.  Reports that her daughter says that Dr. Harrington Challenger is not helping her.  She reports she is taking her medications as directed.  And does not have any trouble.  She says that her daughter says that she does not seem like herself.  And seems more anxious.  When asked about this she reports that she has been more anxious.  Secondary to maybe being at home stuck with the COVID situation.  Then additionally she reports that her son came over and tried to help her with some bills.  And she felt like she needed to ask him to take her somewhere.  And then she felt like she needed to tell him what was going on.  And that she wanted to just do things on her own and not have to be driven around.  And feels like she has a sense of loss and is losing her independence.  She is very erratic in her speech and it is difficult to follow her course of pattern and what she is trying to explain to talk about.  Strong take ways during the conversation with her loss of independence, stress level much higher, anxiety is high, and she feels very down and depressed like she is letting her family down, reports that her daughter now has leukemia, her other daughter had breast cancer, she is just unsure of how to feel about things.  Reports that some of her grandchildren need psychological help.  But they are not willing to get any.  She runs off on many tangents during the conversation.   Secondary conversation requested a virtual behavioral health visit today in the office.  She reports that she feels much better after having a  conversation, she reports that [name] was very direct.  [name] is unsure if she wants to continue with seeing [name] versus going back to Dr. Harrington Challenger.  It appears that she feels very conflicted as to what her daughter exam versus what she is feeling.  Denies having cough, shortness of breath, chest pain, fever, chills, chest tightness, palpitations.  Past Medical, Surgical, Social History, Allergies, and Medications have been Reviewed.   Past Medical History:  Diagnosis Date  . Alpha thalassemia trait 01/26/2010   02/2012: Nl CBC ex H&H-10.7/34.8, MCV-69   . Anemia   . Anxiety   . Anxiety and depression   . Cellulitis 05/09/2017  . Depression   . Diabetes mellitus   . GERD (gastroesophageal reflux disease)   . Headache(784.0)   . Hyperlipidemia   . Hypertension   . Iron deficiency 01/21/2017  . Microcytic anemia 01/26/2010   02/2012: Nl CBC ex H&H-10.7/34.8, MCV-69   . Obesity   . Obstructive sleep apnea   . Osteoarthritis    Left knee; right shoulder; chronic neck and back pain  . Pruritus   . Seizures ([place])   . Tremor    This started months ago after her seizure progressing to very poor hand writing  . Urinary incontinence   . UTI (urinary tract infection)  01/18/2013   Past Surgical History:  Procedure Laterality Date  . ABDOMINAL HYSTERECTOMY    . BREAST EXCISIONAL BIOPSY     Left; cyst  . CATARACT EXTRACTION Right    12/2017  . CATARACT EXTRACTION W/ INTRAOCULAR LENS IMPLANT Left 09/07/2013  . CHOLECYSTECTOMY    . COLONOSCOPY    . COLONOSCOPY N/A 07/20/2015   Procedure: COLONOSCOPY;  Surgeon: Rogene Houston, MD;  Location: [facility];  Service: Endoscopy;  Laterality: N/A;  930  . EYE SURGERY Left 09/07/2013   cataract   Social History   Socioeconomic History  . Marital status: Married    Spouse name: saunders  . Number of children: 5  . Years of education: 80  . Highest education level: Not on file  Occupational History  . Occupation: Disabled      Employer: RETIRED  Social Needs  . Financial resource strain: Not hard at all  . Food insecurity:    Worry: Never true    Inability: Never true  . Transportation needs:    Medical: No    Non-medical: No  Tobacco Use  . Smoking status: Never Smoker  . Smokeless tobacco: Never Used  Substance and Sexual Activity  . Alcohol use: No    Alcohol/week: 0.0 standard drinks  . Drug use: No  . Sexual activity: Yes    Birth control/protection: Surgical  Lifestyle  . Physical activity:    Days per week: 0 days    Minutes per session: 0 min  . Stress: Only a little  Relationships  . Social connections:    Talks on phone: Twice a week    Gets together: Twice a week    Attends religious service: More than 4 times per year    Active member of club or organization: Yes    Attends meetings of clubs or organizations: More than 4 times per year    Relationship status: Married  . Intimate partner violence:    Fear of current or ex partner: No    Emotionally abused: No    Physically abused: No    Forced sexual activity: No  Other Topics Concern  . Not on file  Social History Narrative   Patient lives at home with her husband [name].  Patient is retired.    Right handed.    Five Children.    Caffeine- 2 daily    Outpatient Encounter Medications as of 02/06/2019  Medication Sig  . acetaminophen (EXTRA STRENGTH PAIN RELIEF) 500 MG tablet Take 500 mg by mouth every 6 (six) hours as needed for pain.  Marland Kitchen amLODipine (NORVASC) 10 MG tablet TAKE 1 TABLET BY MOUTH EVERY DAY  . aspirin 81 MG tablet Take 81 mg by mouth daily.    Marland Kitchen atorvastatin (LIPITOR) 40 MG tablet TAKE 1 TABLET BY MOUTH EVERY DAY  . benazepril (LOTENSIN) 20 MG tablet TAKE 1 TABLET BY MOUTH EVERY DAY  . diphenhydrAMINE (BENADRYL) 25 mg capsule Take 25 mg by mouth at bedtime. Reported on 10/18/2015  . gabapentin (NEURONTIN) 400 MG capsule TAKE ONE CAPSULE BY MOUTH EVERY MORNING and TAKE ONE CAPSULE AT NOON and TAKE TWO CAPSULES  AT BEDTIME  . ibuprofen (ADVIL,MOTRIN) 800 MG tablet   . insulin NPH-regular Human (NOVOLIN 70/30) (70-30) 100 UNIT/ML injection 70 units with breakfast, and 10 units with supper, and syringes 2/day.  . Insulin Pen Needle 31G X 8 MM MISC Used to give daily insulin injections.  . Multiple Vitamin (MULTIVITAMIN) capsule Take 1 capsule by mouth  daily.  . mupirocin ointment (BACTROBAN) 2 % Apply to right heel once daily  . omeprazole (PRILOSEC) 20 MG capsule TAKE ONE CAPSULE BY MOUTH EVERY DAY  . ONE TOUCH ULTRA TEST test strip USE TO TEST BLOOD SUGAR FOUR TIMES DAILY  . potassium chloride SA (K-DUR,KLOR-CON) 20 MEQ tablet TAKE 1 TABLET BY MOUTH TWICE DAILY  . sertraline (ZOLOFT) 100 MG tablet Take 1 tablet (100 mg total) by mouth 2 (two) times daily.  Marland Kitchen torsemide (DEMADEX) 20 MG tablet TAKE TWO TABLETS BY MOUTH DAILY (STOP LASIX)  . UNABLE TO FIND Walker x 1  DX unsteady gait, osteoarthritis of left knee  . UNABLE TO FIND Elevated Toliet Seat x 1 DX: unsteady gait, back pain, osteoarthritis of left knee  . diclofenac (VOLTAREN) 75 MG EC tablet Take 1 tablet (75 mg total) by mouth 2 (two) times daily.  . traMADol (ULTRAM) 50 MG tablet Take 1 tablet (50 mg total) by mouth every 8 (eight) hours as needed.  . [DISCONTINUED] ASPIRIN LOW DOSE 81 MG EC tablet Take 81 mg by mouth daily.  . [DISCONTINUED] levofloxacin (LEVAQUIN) 500 MG tablet Take 1 tablet (500 mg total) by mouth daily. (Patient not taking: Reported on 02/06/2019)   No facility-administered encounter medications on file as of 02/06/2019.    Allergies  Allergen Reactions  . Penicillins Shortness Of Breath, Itching and Rash  . Prednisone Shortness Of Breath, Itching and Rash  . Propoxyphene N-Acetaminophen Itching and Nausea And Vomiting    Review of Systems  Constitutional: Negative for activity change, appetite change, chills and fever.  HENT: Negative.   Eyes: Negative.   Respiratory: Negative.   Cardiovascular: Negative.    Gastrointestinal: Negative.   Endocrine: Negative.   Genitourinary: Negative.   Musculoskeletal: Positive for arthralgias.  Skin: Negative.   Neurological: Negative.   Hematological: Negative.   Psychiatric/Behavioral: Positive for confusion and sleep disturbance. The patient is nervous/anxious.        Depressed        Objective:     BP 140/65   Pulse 72   Temp 98.7 F (37.1 C) (Oral)   Resp 14   Ht 5\' 8"  (1.727 m)   Wt 268 lb 6.4 oz (121.7 kg)   SpO2 97%   BMI 40.81 kg/m   Physical Exam Vitals signs and nursing note reviewed.  Constitutional:      Appearance: Normal appearance. She is obese.  HENT:     Head: Normocephalic and atraumatic.     Right Ear: External ear normal.     Left Ear: External ear normal.     Nose: Nose normal.  Eyes:     General:        Right eye: No discharge.        Left eye: No discharge.     Conjunctiva/sclera: Conjunctivae normal.  Neck:     Musculoskeletal: Normal range of motion and neck supple.  Cardiovascular:     Rate and Rhythm: Normal rate and regular rhythm.     Pulses: Normal pulses.     Heart sounds: Normal heart sounds.  Pulmonary:     Effort: Pulmonary effort is normal.     Breath sounds: Normal breath sounds.  Abdominal:     General: Abdomen is flat.     Palpations: Abdomen is soft.  Musculoskeletal: Normal range of motion.  Skin:    General: Skin is warm and dry.     Capillary Refill: Capillary refill takes less than 2 seconds.  Neurological:  Mental Status: She is alert and oriented to person, place, and time.  Psychiatric:        Attention and Perception: Attention normal.        Mood and Affect: Mood is anxious and depressed. Affect is tearful.        Speech: Speech is tangential.        Behavior: Behavior is cooperative.        Thought Content: Thought content is not delusional. Thought content does not include suicidal plan.        Judgment: Judgment normal.        Office Visit from 02/06/2019 in  [facility]  PHQ-9 Total Score  18          Assessment and Plan        1. Moderate episode of recurrent major depressive disorder (HCC) Decompensated, has been seeing Dr. Harrington Challenger is on Zoloft 100 mg daily.  Reports that her daughter does not think that she is getting what she needs from Dr. Harrington Challenger.  Her does not feel like she is getting any better.  Unsure if patient has a understanding of how she feels versus how her family feels and how she should feel versus what she is listening to from her family.  Appears that she has a hard time not trying to help her family function and try to help him get the help they need versus helping herself.  PHQ 9 score is 18 today.  Did consult virtual behavioral health she felt better after talking to myself and the therapist.  Is unsure she will continue to see Dr. Harrington Challenger I think that she would like to continue to see Dr. Harrington Challenger per her.  But is feeling conflicted because her daughter thinks that she needs to see someone else or do something else.  Will have 1 month follow-up with Dr. Moshe Cipro hopefully she has begun to feel better.  During this time.  Advised that she picks the person that she would like to see not who her family wants her to see.Patient acknowledged agreement and understanding of the plan.   Return in about 1 month (around 03/09/2019) for w Dr Moshe Cipro f/u on mood .   Perlie Mayo, DNP, AGNP-BC [place], [address], [place] [address] Office Hours: Mon-Thurs 8 am-5 pm; Fri 8 am-12 pm Office Phone:  [phone]  Office Fax: [phone]

## 2019-02-06 NOTE — BH Specialist Note (Signed)
[place] Telephone Follow-up  MRN: [id number] NAME: Laura Atkins Date: 02/06/19    Total time: 30 minutes    Reason for call today:  Safety Check    PHQ-9 Scores:  Depression screen [facility] 2/9 02/06/2019 07/17/2018 04/28/2018 04/28/2018 02/24/2018  Decreased Interest 2 0 3 0 0  Down, Depressed, Hopeless 2 0 3 0 0  PHQ - 2 Score 4 0 6 0 0  Altered sleeping 0 - 3 - -  Tired, decreased energy 3 - 3 - -  Change in appetite 2 - 2 - -  Feeling bad or failure about yourself  3 - 1 - -  Trouble concentrating 3 - 3 - -  Moving slowly or fidgety/restless 3 - 3 - -  Suicidal thoughts 0 - 0 - -  PHQ-9 Score 18 - 21 - -  Difficult doing work/chores - - Extremely dIfficult - -  Some recent data might be hidden     GAD-7 Scores:  GAD 7 : Generalized Anxiety Score 04/28/2018  Nervous, Anxious, on Edge 3  Control/stop worrying 3  Worry too much - different things 3  Trouble relaxing 3  Restless 3  Easily annoyed or irritable 1  Afraid - awful might happen 2  Total GAD 7 Score 18     Stress Current stressors:  Medication is not working for her.   Sleep:  Increased sleep Appetite:  Fair Coping ability:  Poor Patient taking medications as prescribed:  Yes   Current medications:  Outpatient Encounter Medications as of 02/06/2019  Medication Sig  . acetaminophen (EXTRA STRENGTH PAIN RELIEF) 500 MG tablet Take 500 mg by mouth every 6 (six) hours as needed for pain.  Marland Kitchen amLODipine (NORVASC) 10 MG tablet TAKE 1 TABLET BY MOUTH EVERY DAY  . aspirin 81 MG tablet Take 81 mg by mouth daily.    Marland Kitchen atorvastatin (LIPITOR) 40 MG tablet TAKE 1 TABLET BY MOUTH EVERY DAY  . benazepril (LOTENSIN) 20 MG tablet TAKE 1 TABLET BY MOUTH EVERY DAY  . diclofenac (VOLTAREN) 75 MG EC tablet Take 1 tablet (75 mg total) by mouth 2 (two) times daily.  . diphenhydrAMINE (BENADRYL) 25 mg capsule Take 25 mg by mouth at bedtime. Reported on 10/18/2015  . gabapentin (NEURONTIN) 400 MG capsule TAKE ONE  CAPSULE BY MOUTH EVERY MORNING and TAKE ONE CAPSULE AT NOON and TAKE TWO CAPSULES AT BEDTIME  . ibuprofen (ADVIL,MOTRIN) 800 MG tablet   . insulin NPH-regular Human (NOVOLIN 70/30) (70-30) 100 UNIT/ML injection 70 units with breakfast, and 10 units with supper, and syringes 2/day.  . Insulin Pen Needle 31G X 8 MM MISC Used to give daily insulin injections.  . Multiple Vitamin (MULTIVITAMIN) capsule Take 1 capsule by mouth daily.  . mupirocin ointment (BACTROBAN) 2 % Apply to right heel once daily  . omeprazole (PRILOSEC) 20 MG capsule TAKE ONE CAPSULE BY MOUTH EVERY DAY  . ONE TOUCH ULTRA TEST test strip USE TO TEST BLOOD SUGAR FOUR TIMES DAILY  . potassium chloride SA (K-DUR,KLOR-CON) 20 MEQ tablet TAKE 1 TABLET BY MOUTH TWICE DAILY  . sertraline (ZOLOFT) 100 MG tablet Take 1 tablet (100 mg total) by mouth 2 (two) times daily.  Marland Kitchen torsemide (DEMADEX) 20 MG tablet TAKE TWO TABLETS BY MOUTH DAILY (STOP LASIX)  . traMADol (ULTRAM) 50 MG tablet Take 1 tablet (50 mg total) by mouth every 8 (eight) hours as needed.  Marland Kitchen UNABLE TO FIND Walker x 1  DX unsteady gait, osteoarthritis of  left knee  . UNABLE TO FIND Elevated Toliet Seat x 1 DX: unsteady gait, back pain, osteoarthritis of left knee   No facility-administered encounter medications on file as of 2019/02/28.      Self-harm Behaviors Risk Assessment Self-harm risk factors:  None Reported Patient endorses recent thoughts of harming self:  NA   [place] Suicide Severity Rating Scale: No flowsheet data found. C-SRSS 04/28/2018 2019/02/28  1. Wish to be Dead No No  2. Suicidal Thoughts No No  6. Suicide Behavior Question No No     Danger to Others Risk Assessment Danger to others risk factors:  None Reported Patient endorses recent thoughts of harming others:  NA    Substance Use Assessment Patient recently consumed alcohol:  None Reported  Alcohol Use Disorder Identification Test (AUDIT):  Alcohol Use Disorder Test (AUDIT) 04/28/2018  02-28-2019  1. How often do you have a drink containing alcohol? 0 0  2. How many drinks containing alcohol do you have on a typical day when you are drinking? 0 0  3. How often do you have six or more drinks on one occasion? 0 0  AUDIT-C Score 0 0  Alcohol Brief Interventions/Follow-up AUDIT Score <7 follow-up not indicated AUDIT Score <7 follow-up not indicated   Patient recently used drugs:  None Reported    Goals, Interventions and Follow-up Plan Goals: Increase healthy adjustment to current life circumstances Interventions: Supportive Counseling  Summary:    Patient is 78 year old female.  Patient is a referral from the [facility] referral que from Domenick Bookbinder, NP.   P[place]et reports that her psychiatric medication is not working for her. Patient reports that increased depression and anxiety due to her daughter being diagnosed with cancer.  Patient reports that all her aunts and her mother have died due to cancer.    Patient reports that she receives medication management from Dr. Harrington Challenger and therapy from [place] [facility].  Writer coordinated with Dr. Harrington Challenger and [place] office to schedule an appointment.   The patient has an appointment on May 18 at 1:30pm with Dr. Harrington Challenger and on May 20 at 2:00pm with Maurice Small.   Patient denies SI/HI/Psychosis/Substance Abuse. If your symptoms worsen or you have thoughts of suicide/homicide, PLEASE SEEK IMMEDIATE MEDICAL ATTENTION.  You may always call:  National Suicide Hotline: 724 593 5307; [place] Crisis Line: 607-178-1143; Crisis Recovery in [place]: (385) 821-8183.  These are available 24 hours a day, 7 days a week.  Graciella Freer [place], LCAS-A

## 2019-02-06 NOTE — Patient Instructions (Addendum)
    Thank you for coming into the office today. I appreciate the opportunity to provide you with the care for your health and wellness. Today we discussed: To discuss your overall wellness and mood.  Thank you for completing the behavioral health visit today while in the office.  I hope that you found this beneficial and helpful for you. You can continue with Dr Harrington Challenger or [facility]. We just want you to feel better.  Please continue taking all your current medications as prescribed. We will look at reviewing them at another visit in the future.  Please continue to practice social distancing to keep you, your family, and our community safe.  [place] YOUR HANDS WELL AND FREQUENTLY. AVOID TOUCHING YOUR FACE, UNLESS YOUR HANDS ARE FRESHLY WASHED.  GET FRESH AIR DAILY. STAY HYDRATED WITH WATER.   It was a pleasure to see you and I look forward to continuing to work together on your health and well-being. Please do not hesitate to call the office if you need care or have questions about your care.  Have a wonderful day and week. With Gratitude, Cherly Beach, DNP, AGNP-BC

## 2019-02-09 ENCOUNTER — Other Ambulatory Visit: Payer: Self-pay

## 2019-02-09 ENCOUNTER — Ambulatory Visit (HOSPITAL_COMMUNITY): Payer: Medicare HMO | Admitting: Psychiatry

## 2019-02-09 ENCOUNTER — Encounter (HOSPITAL_COMMUNITY): Payer: Self-pay | Admitting: Psychiatry

## 2019-02-09 ENCOUNTER — Ambulatory Visit (INDEPENDENT_AMBULATORY_CARE_PROVIDER_SITE_OTHER): Payer: Medicare HMO | Admitting: Psychiatry

## 2019-02-09 DIAGNOSIS — F329 Major depressive disorder, single episode, unspecified: Secondary | ICD-10-CM

## 2019-02-09 DIAGNOSIS — F32A Depression, unspecified: Secondary | ICD-10-CM

## 2019-02-09 MED ORDER — SERTRALINE HCL 100 MG PO TABS: 100.0000 mg | ORAL_TABLET | Freq: Every day | ORAL | 5 refills | Status: DC

## 2019-02-09 MED ORDER — BUPROPION HCL ER (XL) 150 MG PO TB24: 150.0000 mg | ORAL_TABLET | ORAL | 2 refills | Status: DC
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
## 2019-02-09 NOTE — Telephone Encounter (Signed)
The above patient or their representative was contacted and gave the following answers to these questions:         Do you have any of the following symptoms?  Fever                    Cough                   Shortness of breath  Do  you have any of the following other symptoms?    muscle pain         vomiting,        diarrhea        rash         weakness        red eye        abdominal pain         bruising          bruising or bleeding              joint pain           severe headache    Have you been in contact with someone who was or has been sick in the past 2 weeks?  Yes                 Unsure                         Unable to assess   Does the person that you were in contact with have any of the following symptoms? n  Cough         shortness of breath           muscle pain         vomiting,            diarrhea            rash            weakness           fever            red eye           abdominal pain           bruising  or  bleeding                joint pain                severe headache               Have you  or someone you have been in contact with traveled internationally in th last month?n         If yes, which countries?   Have you  or someone you have been in contact with traveled outside [place] in th last month? n        If yes, which state and city?   COMMENTS OR ACTION PLAN FOR THIS PATIENT: n

## 2019-02-10 ENCOUNTER — Inpatient Hospital Stay (HOSPITAL_COMMUNITY): Admission: RE | Admit: 2019-02-10 | Payer: Medicare HMO | Source: Ambulatory Visit

## 2019-02-10 ENCOUNTER — Encounter: Payer: Medicare HMO | Admitting: Vascular Surgery

## 2019-02-11 ENCOUNTER — Other Ambulatory Visit: Payer: Self-pay

## 2019-02-11 ENCOUNTER — Ambulatory Visit (HOSPITAL_COMMUNITY): Payer: Medicare HMO | Admitting: Psychiatry

## 2019-02-11 ENCOUNTER — Telehealth (HOSPITAL_COMMUNITY): Payer: Self-pay | Admitting: Psychiatry

## 2019-02-11 NOTE — Telephone Encounter (Signed)
Therapist attempted to contact patient twice for scheduled appointment..did not leave message as voice mailbox was not established.

## 2019-02-13 ENCOUNTER — Telehealth (HOSPITAL_COMMUNITY): Payer: Self-pay

## 2019-02-13 ENCOUNTER — Other Ambulatory Visit: Payer: Self-pay | Admitting: Cardiology

## 2019-02-13 NOTE — Telephone Encounter (Signed)
Medication management - Telephone call with Barnett Applebaum, pharmacist at [facility] to verify for them that Dr. Harrington Challenger did decrease patient's prescribed Sertraline to 100 mg, one a day during patient's evaluation 02/09/19, per record review of Dr. Harrington Challenger' note that date, when she also ordered the start of Bupropion.

## 2019-02-17 ENCOUNTER — Telehealth: Payer: Self-pay

## 2019-02-17 NOTE — Telephone Encounter (Signed)
[facility] - Inactive, patient completed her appt with Dr. Harrington Challenger on 02-09-19.  Writer routed this information to the PCP and Dr. Modesta Messing.

## 2019-02-23 ENCOUNTER — Ambulatory Visit (INDEPENDENT_AMBULATORY_CARE_PROVIDER_SITE_OTHER): Payer: Medicare HMO | Admitting: Endocrinology

## 2019-02-23 ENCOUNTER — Other Ambulatory Visit: Payer: Self-pay

## 2019-02-23 DIAGNOSIS — Z794 Long term (current) use of insulin: Secondary | ICD-10-CM | POA: Diagnosis not present

## 2019-02-23 DIAGNOSIS — E1165 Type 2 diabetes mellitus with hyperglycemia: Secondary | ICD-10-CM

## 2019-02-23 NOTE — Progress Notes (Signed)
Subjective:    Patient ID: Laura Atkins, female    DOB: May 07, 1941, 78 y.o.   MRN: [id number]  HPI  telehealth visit today via phone x 19 minutes.   Alternatives to telehealth are presented to this patient, and the patient agrees to the telehealth visit.   Pt is advised of the cost of the visit, and agrees to this, also.   Patient is at home, and I am at the office.   Persons attending the telehealth visit: the patient and I Pt returns for f/u of diabetes mellitus: DM type: insulin-requiring type 2.   Dx'ed: 5170 Complications: polyneuropathy, PAD, and foot ulcer.   Therapy: insulin since 2005.   GDM: never.  DKA: never Severe hypoglycemia: never.   Pancreatitis: never.   Other info: in early 2016, she changed to BID premixed insulin, due to poor results with multiple daily injections.  She takes human insulin, due to cost.   Interval history: she takes 70 units with breakfast, and 20 units with supper.  She had hypoglycemia during recent flu-like illness.  Other than the illness, she says cbg varies from 71-375. There is no trend throughout the day.   Past Medical History:  Diagnosis Date  . Alpha thalassemia trait 01/26/2010   02/2012: Nl CBC ex H&H-10.7/34.8, MCV-69   . Anemia   . Anxiety   . Anxiety and depression   . Cellulitis 05/09/2017  . Depression   . Diabetes mellitus   . GERD (gastroesophageal reflux disease)   . Headache(784.0)   . Hyperlipidemia   . Hypertension   . Iron deficiency 01/21/2017  . Microcytic anemia 01/26/2010   02/2012: Nl CBC ex H&H-10.7/34.8, MCV-69   . Obesity   . Obstructive sleep apnea   . Osteoarthritis    Left knee; right shoulder; chronic neck and back pain  . Pruritus   . Seizures ([place])   . Tremor    This started months ago after her seizure progressing to very poor hand writing  . Urinary incontinence   . UTI (urinary tract infection) 01/18/2013    Past Surgical History:  Procedure Laterality Date  . ABDOMINAL HYSTERECTOMY    .  BREAST EXCISIONAL BIOPSY     Left; cyst  . CATARACT EXTRACTION Right    12/2017  . CATARACT EXTRACTION W/ INTRAOCULAR LENS IMPLANT Left 09/07/2013  . CHOLECYSTECTOMY    . COLONOSCOPY    . COLONOSCOPY N/A 07/20/2015   Procedure: COLONOSCOPY;  Surgeon: Rogene Houston, MD;  Location[facility];  Service: Endoscopy;  Laterality: N/A;  930  . EYE SURGERY Left 09/07/2013   cataract    Social History   Socioeconomic History  . Marital status: Married    Spouse name: Laura Atkins  . Number of children: 5  . Years of education: 49  . Highest education level: Not on file  Occupational History  . Occupation: Disabled     Employer: RETIRED  Social Needs  . Financial resource strain: Not hard at all  . Food insecurity:    Worry: Never true    Inability: Never true  . Transportation needs:    Medical: No    Non-medical: No  Tobacco Use  . Smoking status: Never Smoker  . Smokeless tobacco: Never Used  Substance and Sexual Activity  . Alcohol use: No    Alcohol/week: 0.0 standard drinks  . Drug use: No  . Sexual activity: Yes    Birth control/protection: Surgical  Lifestyle  . Physical activity:  Days per week: 0 days    Minutes per session: 0 min  . Stress: Only a little  Relationships  . Social connections:    Talks on phone: Twice a week    Gets together: Twice a week    Attends religious service: More than 4 times per year    Active member of club or organization: Yes    Attends meetings of clubs or organizations: More than 4 times per year    Relationship status: Married  . Intimate partner violence:    Fear of current or ex partner: No    Emotionally abused: No    Physically abused: No    Forced sexual activity: No  Other Topics Concern  . Not on file  Social History Narrative   Patient lives at home with her husband Laura Atkins).  Patient is retired.    Right handed.    Five Children.    Caffeine- 2 daily    Current Outpatient Medications on File Prior to  Visit  Medication Sig Dispense Refill  . acetaminophen (EXTRA STRENGTH PAIN RELIEF) 500 MG tablet Take 500 mg by mouth every 6 (six) hours as needed for pain.    Marland Kitchen amLODipine (NORVASC) 10 MG tablet TAKE 1 TABLET BY MOUTH EVERY DAY 30 tablet 2  . aspirin 81 MG tablet Take 81 mg by mouth daily.      Marland Kitchen atorvastatin (LIPITOR) 40 MG tablet TAKE 1 TABLET BY MOUTH EVERY DAY 30 tablet 2  . benazepril (LOTENSIN) 20 MG tablet TAKE 1 TABLET BY MOUTH EVERY DAY 30 tablet 2  . buPROPion (WELLBUTRIN XL) 150 MG 24 hr tablet Take 1 tablet (150 mg total) by mouth every morning. 30 tablet 2  . diclofenac (VOLTAREN) 75 MG EC tablet Take 1 tablet (75 mg total) by mouth 2 (two) times daily. 30 tablet 0  . diphenhydrAMINE (BENADRYL) 25 mg capsule Take 25 mg by mouth at bedtime. Reported on 10/18/2015    . gabapentin (NEURONTIN) 400 MG capsule TAKE ONE CAPSULE BY MOUTH EVERY MORNING and TAKE ONE CAPSULE AT NOON and TAKE TWO CAPSULES AT BEDTIME 360 capsule 1  . ibuprofen (ADVIL,MOTRIN) 800 MG tablet     . insulin NPH-regular Human (NOVOLIN 70/30) (70-30) 100 UNIT/ML injection 70 units with breakfast, and 10 units with supper, and syringes 2/day. (Patient taking differently: 70 units with breakfast, and 20 units with supper, and syringes 2/day.) 30 mL 11  . Insulin Pen Needle 31G X 8 MM MISC Used to give daily insulin injections. 100 each prn  . Multiple Vitamin (MULTIVITAMIN) capsule Take 1 capsule by mouth daily.    . mupirocin ointment (BACTROBAN) 2 % Apply to right heel once daily 22 g 1  . omeprazole (PRILOSEC) 20 MG capsule TAKE ONE CAPSULE BY MOUTH EVERY DAY 30 capsule 2  . ONE TOUCH ULTRA TEST test strip USE TO TEST BLOOD SUGAR FOUR TIMES DAILY 200 each 0  . potassium chloride SA (K-DUR,KLOR-CON) 20 MEQ tablet TAKE 1 TABLET BY MOUTH TWICE DAILY 180 tablet 1  . sertraline (ZOLOFT) 100 MG tablet Take 1 tablet (100 mg total) by mouth daily. 60 tablet 5  . torsemide (DEMADEX) 20 MG tablet TAKE TWO TABLETS BY MOUTH  EVERY DAY (STOP lasix) 180 tablet 3  . traMADol (ULTRAM) 50 MG tablet Take 1 tablet (50 mg total) by mouth every 8 (eight) hours as needed. 30 tablet 0  . UNABLE TO FIND Walker x 1  DX unsteady gait, osteoarthritis of left knee 1  each 0  . UNABLE TO FIND Elevated Toliet Seat x 1 DX: unsteady gait, back pain, osteoarthritis of left knee 1 each 0   No current facility-administered medications on file prior to visit.     Allergies  Allergen Reactions  . Penicillins Shortness Of Breath, Itching and Rash  . Prednisone Shortness Of Breath, Itching and Rash  . Propoxyphene N-Acetaminophen Itching and Nausea And Vomiting    Family History  Problem Relation Age of Onset  . Lung cancer Mother 33  . Kidney disease Father   . Diabetes Sister   . Keloids Brother   . ADD / ADHD Grandchild   . Bipolar disorder Grandchild   . Bipolar disorder Daughter   . Seizures Daughter   . Heart disease Daughter   . Kidney disease Son   . Neuropathy Son   . Kidney disease Son   . Edema Daughter   . Breast cancer Daughter 67  . Allergies Daughter   . Alcohol abuse Neg Hx   . Drug abuse Neg Hx       Review of Systems She denies hypoglycemia.     Objective:   Physical Exam   Lab Results  Component Value Date   CREATININE 0.79 11/26/2018   BUN 13 11/26/2018   NA 141 11/26/2018   K 4.0 11/26/2018   CL 102 11/26/2018   CO2 32 11/26/2018       Assessment & Plan:  Insulin-requiring type 2 DM, with PAD: uncertain glycemic control.   Hypoglycemia, due to illness.    Patient Instructions  check your blood sugar twice a day.  vary the time of day when you check, between before the 3 meals, and at bedtime.  also check if you have symptoms of your blood sugar being too high or too low.  please keep a record of the readings and bring it to your next appointment here (or you can bring the meter itself).  You can write it on any piece of paper.  please call us sooner if your blood sugar goes below  70, or if you have a lot of readings over 200. Please change the insulin to 70 units with breakfast, and 10 units with supper.   On this type of insulin schedule, you should eat meals on a regular schedule.  If a meal is missed or significantly delayed, your blood sugar could go low.   Please come back for a follow-up appointment in 2 months.  Please see Vaughan Basta or Mickel Baas the same day, to review sick-day management.

## 2019-02-23 NOTE — Patient Instructions (Addendum)
check your blood sugar twice a day.  vary the time of day when you check, between before the 3 meals, and at bedtime.  also check if you have symptoms of your blood sugar being too high or too low.  please keep a record of the readings and bring it to your next appointment here (or you can bring the meter itself).  You can write it on any piece of paper.  please call us sooner if your blood sugar goes below 70, or if you have a lot of readings over 200. Please change the insulin to 70 units with breakfast, and 10 units with supper.   On this type of insulin schedule, you should eat meals on a regular schedule.  If a meal is missed or significantly delayed, your blood sugar could go low.   Please come back for a follow-up appointment in 2 months.  Please see Vaughan Basta or Mickel Baas the same day, to review sick-day management.

## 2019-02-25 ENCOUNTER — Ambulatory Visit: Payer: Medicare HMO | Admitting: Family Medicine

## 2019-02-26 ENCOUNTER — Encounter: Payer: Self-pay | Admitting: Family Medicine

## 2019-02-26 ENCOUNTER — Ambulatory Visit (INDEPENDENT_AMBULATORY_CARE_PROVIDER_SITE_OTHER): Payer: Medicare HMO | Admitting: Family Medicine

## 2019-02-26 VITALS — BP 140/65 | HR 72 | Resp 14 | Ht 68.0 in | Wt 268.0 lb

## 2019-02-26 DIAGNOSIS — Z0000 Encounter for general adult medical examination without abnormal findings: Secondary | ICD-10-CM

## 2019-02-26 NOTE — Patient Instructions (Signed)
Laura Atkins , Thank you for taking time to come for your Medicare Wellness Visit. I appreciate your ongoing commitment to your health goals. Please review the following plan we discussed and let me know if I can assist you in the future.   Please continue to practice social distancing during this time to keep you, your family and our community safe.  If you must go out please wear a mask and practice good hand hygiene.  Screening recommendations/referrals: Colonoscopy: Due in 2026 Mammogram: Due in 2020 Bone Density: Up to date Recommended yearly ophthalmology/optometry visit for glaucoma screening and checkup Recommended yearly dental visit for hygiene and checkup  Vaccinations: Influenza vaccine: Due Fall 2020 Pneumococcal vaccine: Completed Tdap vaccine: Due 2029 Shingles vaccine: Talk with Dr Moshe Cipro about it at next visit  Advanced directives: Husband knows her wishes  Conditions/risks identified: Discussed during your visit today please see the below information for preventative measures that you can implement at home to help keep yourself safe.  Next appointment: 04/23/2019 8 your next appointment.  We will be calling you in advance to make sure that you are able to still make the appointment.  Please let us know if you need [place] before then.    Preventive Care 76 Years and Older, Female Preventive care refers to lifestyle choices and visits with your health care provider that can promote health and wellness. What does preventive care include?  A yearly physical exam. This is also called an annual well check.  Dental exams once or twice a year.  Routine eye exams. Ask your health care provider how often you should have your eyes checked.  Personal lifestyle choices, including:  Daily care of your teeth and gums.  Regular physical activity.  Eating a healthy diet.  Avoiding tobacco and drug use.  Limiting alcohol use.  Practicing safe sex.  Taking low-dose aspirin  every day.  Taking vitamin and mineral supplements as recommended by your health care provider. What happens during an annual well check? The services and screenings done by your health care provider during your annual well check will depend on your age, overall health, lifestyle risk factors, and family history of disease. Counseling  Your health care provider may ask you questions about your:  Alcohol use.  Tobacco use.  Drug use.  Emotional well-being.  Home and relationship well-being.  Sexual activity.  Eating habits.  History of falls.  Memory and ability to understand (cognition).  Work and work Statistician.  Reproductive health. Screening  You may have the following tests or measurements:  Height, weight, and BMI.  Blood pressure.  Lipid and cholesterol levels. These may be checked every 5 years, or more frequently if you are over 2 years old.  Skin check.  Lung cancer screening. You may have this screening every year starting at age 66 if you have a 30-pack-year history of smoking and currently smoke or have quit within the past 15 years.  Fecal occult blood test (FOBT) of the stool. You may have this test every year starting at age 73.  Flexible sigmoidoscopy or colonoscopy. You may have a sigmoidoscopy every 5 years or a colonoscopy every 10 years starting at age 87.  Hepatitis C blood test.  Hepatitis B blood test.  Sexually transmitted disease (STD) testing.  Diabetes screening. This is done by checking your blood sugar (glucose) after you have not eaten for a while (fasting). You may have this done every 1-3 years.  Bone density scan. This is done  to screen for osteoporosis. You may have this done starting at age 31.  Mammogram. This may be done every 1-2 years. Talk to your health care provider about how often you should have regular mammograms. Talk with your health care provider about your test results, treatment options, and if necessary,  the need for more tests. Vaccines  Your health care provider may recommend certain vaccines, such as:  Influenza vaccine. This is recommended every year.  Tetanus, diphtheria, and acellular pertussis (Tdap, Td) vaccine. You may need a Td booster every 10 years.  Zoster vaccine. You may need this after age 9.  Pneumococcal 13-valent conjugate (PCV13) vaccine. One dose is recommended after age 3.  Pneumococcal polysaccharide (PPSV23) vaccine. One dose is recommended after age 74. Talk to your health care provider about which screenings and vaccines you need and how often you need them. This information is not intended to replace advice given to you by your health care provider. Make sure you discuss any questions you have with your health care provider. Document Released: 10/07/2015 Document Revised: 05/30/2016 Document Reviewed: 07/12/2015 Elsevier Interactive Patient Education  2017 [employer] Prevention in the Home Falls can cause injuries. They can happen to people of all ages. There are many things you can do to make your home safe and to help prevent falls. What can I do on the outside of my home?  Regularly fix the edges of walkways and driveways and fix any cracks.  Remove anything that might make you trip as you walk through a door, such as a raised step or threshold.  Trim any bushes or trees on the path to your home.  Use bright outdoor lighting.  Clear any walking paths of anything that might make someone trip, such as rocks or tools.  Regularly check to see if handrails are loose or broken. Make sure that both sides of any steps have handrails.  Any raised decks and porches should have guardrails on the edges.  Have any leaves, snow, or ice cleared regularly.  Use sand or salt on walking paths during winter.  Clean up any spills in your garage right away. This includes oil or grease spills. What can I do in the bathroom?  Use night lights.  Install  grab bars by the toilet and in the tub and shower. Do not use towel bars as grab bars.  Use non-skid mats or decals in the tub or shower.  If you need to sit down in the shower, use a plastic, non-slip stool.  Keep the floor dry. Clean up any water that spills on the floor as soon as it happens.  Remove soap buildup in the tub or shower regularly.  Attach bath mats securely with double-sided non-slip rug tape.  Do not have throw rugs and other things on the floor that can make you trip. What can I do in the bedroom?  Use night lights.  Make sure that you have a light by your bed that is easy to reach.  Do not use any sheets or blankets that are too big for your bed. They should not hang down onto the floor.  Have a firm chair that has side arms. You can use this for support while you get dressed.  Do not have throw rugs and other things on the floor that can make you trip. What can I do in the kitchen?  Clean up any spills right away.  Avoid walking on wet floors.  Keep items  that you use a lot in easy-to-reach places.  If you need to reach something above you, use a strong step stool that has a grab bar.  Keep electrical cords out of the way.  Do not use floor polish or wax that makes floors slippery. If you must use wax, use non-skid floor wax.  Do not have throw rugs and other things on the floor that can make you trip. What can I do with my stairs?  Do not leave any items on the stairs.  Make sure that there are handrails on both sides of the stairs and use them. Fix handrails that are broken or loose. Make sure that handrails are as long as the stairways.  Check any carpeting to make sure that it is firmly attached to the stairs. Fix any carpet that is loose or worn.  Avoid having throw rugs at the top or bottom of the stairs. If you do have throw rugs, attach them to the floor with carpet tape.  Make sure that you have a light switch at the top of the stairs and  the bottom of the stairs. If you do not have them, ask someone to add them for you. What else can I do to help prevent falls?  Wear shoes that:  Do not have high heels.  Have rubber bottoms.  Are comfortable and fit you well.  Are closed at the toe. Do not wear sandals.  If you use a stepladder:  Make sure that it is fully opened. Do not climb a closed stepladder.  Make sure that both sides of the stepladder are locked into place.  Ask someone to hold it for you, if possible.  Clearly mark and make sure that you can see:  Any grab bars or handrails.  First and last steps.  Where the edge of each step is.  Use tools that help you move around (mobility aids) if they are needed. These include:  Canes.  Walkers.  Scooters.  Crutches.  Turn on the lights when you go into a dark area. Replace any light bulbs as soon as they burn out.  Set up your furniture so you have a clear path. Avoid moving your furniture around.  If any of your floors are uneven, fix them.  If there are any pets around you, be aware of where they are.  Review your medicines with your doctor. Some medicines can make you feel dizzy. This can increase your chance of falling. Ask your doctor what other things that you can do to help prevent falls. This information is not intended to replace advice given to you by your health care provider. Make sure you discuss any questions you have with your health care provider. Document Released: 07/07/2009 Document Revised: 02/16/2016 Document Reviewed: 10/15/2014 Elsevier Interactive Patient Education  2017 [employer].

## 2019-02-26 NOTE — Progress Notes (Signed)
Subjective:   Laura Atkins is a 78 y.o. female who presents for Medicare Annual (Subsequent) preventive examination.  Location of Patient: Home Location of Provider: Telehealth Consent was obtain for visit to be over via telehealth. I verified that I am speaking with the correct person using two identifiers.   Review of Systems:    Cardiac Risk Factors include: advanced age (>58men, >61 women);diabetes mellitus;dyslipidemia;obesity (BMI >30kg/m2);sedentary lifestyle;hypertension     Objective:     Vitals: BP 140/65   Pulse 72   Resp 14   Ht 5\' 8"  (1.727 m)   Wt 268 lb (121.6 kg)   BMI 40.75 kg/m   Body mass index is 40.75 kg/m.  Advanced Directives 02/24/2018 08/19/2017 05/08/2017 03/26/2017 03/22/2017 02/11/2017 01/24/2017  Does Patient Have a Medical Advance Directive? No No No No No No No  Type of Advance Directive - - - - - - -  Copy of Healthcare Power of Attorney in Chart? - - - - - - -  Would patient like information on creating a medical advance directive? Yes (MAU/Ambulatory/Procedural Areas - Information given) No - Patient declined - Yes (MAU/Ambulatory/Procedural Areas - Information given) - Yes (MAU/Ambulatory/Procedural Areas - Information given) No - Patient declined  Pre-existing out of facility DNR order (yellow form or pink MOST form) - - - - - - -  Some encounter information is confidential and restricted. Go to Review Flowsheets activity to see all data.    Tobacco Social History   Tobacco Use  Smoking Status Never Smoker  Smokeless Tobacco Never Used     Counseling given: Yes   Clinical Intake:  Pre-visit preparation completed: Yes  Pain : No/denies pain Pain Score: 0-No pain     BMI - recorded: 40.75 Nutritional Status: BMI > 30  Obese Nutritional Risks: None Diabetes: Yes CBG done?: No Did pt. bring in CBG monitor from home?: No  How often do you need to have someone help you when you read instructions, pamphlets, or other written  materials from your doctor or pharmacy?: 1 - Never What is the last grade level you completed in school?: 12  Interpreter Needed?: No     Past Medical History:  Diagnosis Date  . Alpha thalassemia trait 01/26/2010   02/2012: Nl CBC ex H&H-10.7/34.8, MCV-69   . Anemia   . Anxiety   . Anxiety and depression   . Cellulitis 05/09/2017  . Depression   . Diabetes mellitus   . GERD (gastroesophageal reflux disease)   . Headache(784.0)   . Hyperlipidemia   . Hypertension   . Iron deficiency 01/21/2017  . Microcytic anemia 01/26/2010   02/2012: Nl CBC ex H&H-10.7/34.8, MCV-69   . Obesity   . Obstructive sleep apnea   . Osteoarthritis    Left knee; right shoulder; chronic neck and back pain  . Pruritus   . Seizures ([place])   . Tremor    This started months ago after her seizure progressing to very poor hand writing  . Urinary incontinence   . UTI (urinary tract infection) 01/18/2013   Past Surgical History:  Procedure Laterality Date  . ABDOMINAL HYSTERECTOMY    . BREAST EXCISIONAL BIOPSY     Left; cyst  . CATARACT EXTRACTION Right    12/2017  . CATARACT EXTRACTION W/ INTRAOCULAR LENS IMPLANT Left 09/07/2013  . CHOLECYSTECTOMY    . COLONOSCOPY    . COLONOSCOPY N/A 07/20/2015   Procedure: COLONOSCOPY;  Surgeon: Rogene Houston, MD;  Location: [facility];  Service: Endoscopy;  Laterality: N/A;  930  . EYE SURGERY Left 09/07/2013   cataract   Family History  Problem Relation Age of Onset  . Lung cancer Mother 6  . Kidney disease Father   . Diabetes Sister   . Keloids Brother   . ADD / ADHD Grandchild   . Bipolar disorder Grandchild   . Bipolar disorder Daughter   . Seizures Daughter   . Heart disease Daughter   . Kidney disease Son   . Neuropathy Son   . Kidney disease Son   . Edema Daughter   . Breast cancer Daughter 63  . Allergies Daughter   . Alcohol abuse Neg Hx   . Drug abuse Neg Hx    Social History   Socioeconomic History  . Marital status: Married     Spouse name: saunders  . Number of children: 5  . Years of education: 16  . Highest education level: Not on file  Occupational History  . Occupation: Disabled     Employer: RETIRED  Social Needs  . Financial resource strain: Not hard at all  . Food insecurity:    Worry: Never true    Inability: Never true  . Transportation needs:    Medical: No    Non-medical: No  Tobacco Use  . Smoking status: Never Smoker  . Smokeless tobacco: Never Used  Substance and Sexual Activity  . Alcohol use: No    Alcohol/week: 0.0 standard drinks  . Drug use: No  . Sexual activity: Yes    Birth control/protection: Surgical  Lifestyle  . Physical activity:    Days per week: 0 days    Minutes per session: 0 min  . Stress: Only a little  Relationships  . Social connections:    Talks on phone: Twice a week    Gets together: Twice a week    Attends religious service: More than 4 times per year    Active member of club or organization: Yes    Attends meetings of clubs or organizations: More than 4 times per year    Relationship status: Married  Other Topics Concern  . Not on file  Social History Narrative   Patient lives at home with her husband [name].  Patient is retired.    Right handed.    Five Children.    Caffeine- 2 daily    Outpatient Encounter Medications as of 02/26/2019  Medication Sig  . acetaminophen (EXTRA STRENGTH PAIN RELIEF) 500 MG tablet Take 500 mg by mouth every 6 (six) hours as needed for pain.  Marland Kitchen amLODipine (NORVASC) 10 MG tablet TAKE 1 TABLET BY MOUTH EVERY DAY  . aspirin 81 MG tablet Take 81 mg by mouth daily.    Marland Kitchen atorvastatin (LIPITOR) 40 MG tablet TAKE 1 TABLET BY MOUTH EVERY DAY  . benazepril (LOTENSIN) 20 MG tablet TAKE 1 TABLET BY MOUTH EVERY DAY  . buPROPion (WELLBUTRIN XL) 150 MG 24 hr tablet Take 1 tablet (150 mg total) by mouth every morning.  . diclofenac (VOLTAREN) 75 MG EC tablet Take 1 tablet (75 mg total) by mouth 2 (two) times daily.  .  diphenhydrAMINE (BENADRYL) 25 mg capsule Take 25 mg by mouth at bedtime. Reported on 10/18/2015  . gabapentin (NEURONTIN) 400 MG capsule TAKE ONE CAPSULE BY MOUTH EVERY MORNING and TAKE ONE CAPSULE AT NOON and TAKE TWO CAPSULES AT BEDTIME  . ibuprofen (ADVIL,MOTRIN) 800 MG tablet   . insulin NPH-regular Human (NOVOLIN 70/30) (70-30) 100 UNIT/ML injection 70  units with breakfast, and 10 units with supper, and syringes 2/day. (Patient taking differently: 70 units with breakfast, and 20 units with supper, and syringes 2/day.)  . Insulin Pen Needle 31G X 8 MM MISC Used to give daily insulin injections.  . Multiple Vitamin (MULTIVITAMIN) capsule Take 1 capsule by mouth daily.  . mupirocin ointment (BACTROBAN) 2 % Apply to right heel once daily  . omeprazole (PRILOSEC) 20 MG capsule TAKE ONE CAPSULE BY MOUTH EVERY DAY  . ONE TOUCH ULTRA TEST test strip USE TO TEST BLOOD SUGAR FOUR TIMES DAILY  . potassium chloride SA (K-DUR,KLOR-CON) 20 MEQ tablet TAKE 1 TABLET BY MOUTH TWICE DAILY  . sertraline (ZOLOFT) 100 MG tablet Take 1 tablet (100 mg total) by mouth daily.  Marland Kitchen torsemide (DEMADEX) 20 MG tablet TAKE TWO TABLETS BY MOUTH EVERY DAY (STOP lasix)  . traMADol (ULTRAM) 50 MG tablet Take 1 tablet (50 mg total) by mouth every 8 (eight) hours as needed.  Marland Kitchen UNABLE TO FIND Walker x 1  DX unsteady gait, osteoarthritis of left knee  . UNABLE TO FIND Elevated Toliet Seat x 1 DX: unsteady gait, back pain, osteoarthritis of left knee   No facility-administered encounter medications on file as of 02/26/2019.     Activities of Daily Living In your present state of health, do you have any difficulty performing the following activities: 02/26/2019  Hearing? Y  Vision? N  Difficulty concentrating or making decisions? Y  Walking or climbing stairs? Y  Dressing or bathing? N  Doing errands, shopping? Y  Preparing Food and eating ? Y  Using the Toilet? N  In the past six months, have you accidently leaked urine? Y   Do you have problems with loss of bowel control? N  Managing your Medications? N  Managing your Finances? N  Housekeeping or managing your Housekeeping? Y  Some recent data might be hidden    Patient Care Team: Fayrene Helper, MD as PCP - General Marcial Pacas, MD as Consulting Physician (Neurology) Renato Shin, MD as Consulting Physician (Endocrinology) Cloria Spring, MD as Consulting Physician ([place]) Alonza Smoker, LCSW as Social Worker (Psychiatry) Clent Jacks, MD as Consulting Physician (Ophthalmology)    Assessment:   This is a routine wellness examination for [place].  Exercise Activities and Dietary recommendations Current Exercise Habits: The patient does not participate in regular exercise at present, Exercise limited by: orthopedic condition(s)  Goals    . Exercise 3x per week (30 min per time)     Recommend starting a routine exercise program at least 3 days a week for 30-45 minutes at a time as tolerated.         Fall Risk Fall Risk  02/26/2019 02/06/2019 11/26/2018 07/17/2018 04/28/2018  Falls in the past year? 1 0 0 No No  Number falls in past yr: 1 - 0 - -  Injury with Fall? 1 0 0 - -  Risk Factor Category  - - - - -  Risk for fall due to : - - Impaired balance/gait;Impaired mobility - -  Follow up - - - - -  Comment - - - - -   Is the patient's home free of loose throw rugs in walkways, pet beds, electrical cords, etc?   yes      Grab bars in the bathroom? no      Handrails on the stairs?   yes      Adequate lighting?   yes  Timed Get Up and  Go performed: N/A  Depression Screen PHQ 2/9 Scores 02/26/2019 02/06/2019 07/17/2018 04/28/2018  PHQ - 2 Score 1 4 0 6  PHQ- 9 Score 7 18 - 21  Exception Documentation - - - -     Cognitive Function     6CIT Screen 02/26/2019 02/24/2018 02/11/2017  What Year? 0 points 0 points 0 points  What month? 0 points 0 points 0 points  What time? 0 points 0 points 0 points  Count back from 20 0 points 0  points 0 points  Months in reverse 0 points 0 points 0 points  Repeat phrase 2 points 2 points 0 points  Total Score 2 2 0    Immunization History  Administered Date(s) Administered  . H1N1 07/15/2008  . Influenza Split 06/17/2012  . Influenza Whole 07/03/2007, 06/17/2009, 06/07/2011  . Influenza, High Dose Seasonal PF 07/17/2018  . Influenza,inj,Quad PF,6+ Mos 07/13/2013, 09/06/2014, 08/10/2015, 05/23/2016, 05/22/2017  . Pneumococcal Conjugate-13 05/03/2014  . Pneumococcal Polysaccharide-23 09/08/2010  . Td 09/08/2010  . Tdap 07/17/2018    Qualifies for Shingles Vaccine? Does not want vaccine really, but would consider.   Screening Tests Health Maintenance  Topic Date Due  . OPHTHALMOLOGY EXAM  02/07/2019  . INFLUENZA VACCINE  04/25/2019  . HEMOGLOBIN A1C  05/29/2019  . FOOT EXAM  08/15/2019  . COLONOSCOPY  07/19/2025  . TETANUS/TDAP  07/17/2028  . DEXA SCAN  Completed  . PNA vac Low Risk Adult  Completed    Cancer Screenings: Lung: Low Dose CT Chest recommended if Age 51-80 years, 30 pack-year currently smoking OR have quit w/in 15years. Patient does not qualify. Breast:  Up to date on Mammogram? Yes   Up to date of Bone Density/Dexa? Yes Colorectal:  2026  Additional Screenings:   Hepatitis C Screening: Can be added to next set of labs.      Plan:      1. Encounter for Medicare annual wellness exam  I have personally reviewed and noted the following in the patient's chart:   . Medical and social history . Use of alcohol, tobacco or illicit drugs  . Current medications and supplements . Functional ability and status . Nutritional status . Physical activity . Advanced directives . List of other physicians . Hospitalizations, surgeries, and ER visits in previous 12 months . Vitals . Screenings to include cognitive, depression, and falls . Referrals and appointments  In addition, I have reviewed and discussed with patient certain preventive protocols,  quality metrics, and best practice recommendations. A written personalized care plan for preventive services as well as general preventive health recommendations were provided to patient.     I provided 20 minutes of non-face-to-face time during this encounter.    Perlie Mayo, NP  02/26/2019

## 2019-03-02 ENCOUNTER — Other Ambulatory Visit: Payer: Self-pay

## 2019-03-02 ENCOUNTER — Ambulatory Visit: Payer: Medicare HMO | Admitting: Podiatry

## 2019-03-02 VITALS — Temp 97.9°F

## 2019-03-02 DIAGNOSIS — E0843 Diabetes mellitus due to underlying condition with diabetic autonomic (poly)neuropathy: Secondary | ICD-10-CM

## 2019-03-02 DIAGNOSIS — I739 Peripheral vascular disease, unspecified: Secondary | ICD-10-CM

## 2019-03-02 DIAGNOSIS — L97312 Non-pressure chronic ulcer of right ankle with fat layer exposed: Secondary | ICD-10-CM | POA: Diagnosis not present

## 2019-03-03 ENCOUNTER — Ambulatory Visit (HOSPITAL_COMMUNITY): Payer: Medicare HMO | Admitting: Psychiatry

## 2019-03-03 ENCOUNTER — Encounter: Payer: Medicare HMO | Attending: Endocrinology | Admitting: Nutrition

## 2019-03-03 ENCOUNTER — Ambulatory Visit: Payer: Self-pay | Admitting: Sports Medicine

## 2019-03-03 ENCOUNTER — Telehealth: Payer: Self-pay | Admitting: Nutrition

## 2019-03-03 DIAGNOSIS — E1165 Type 2 diabetes mellitus with hyperglycemia: Secondary | ICD-10-CM | POA: Insufficient documentation

## 2019-03-03 DIAGNOSIS — E1101 Type 2 diabetes mellitus with hyperosmolarity with coma: Secondary | ICD-10-CM

## 2019-03-03 DIAGNOSIS — Z794 Long term (current) use of insulin: Secondary | ICD-10-CM | POA: Insufficient documentation

## 2019-03-03 NOTE — Telephone Encounter (Signed)
Patient was in the office   Meter download shows blood sugar 3 days ago acS of 61.  FBS yesterday 71.  She is taking 70/30 insulin--70u acB and 10u acS.  Please advise

## 2019-03-03 NOTE — Telephone Encounter (Signed)
Please decrease insulin to 60 units with breakfast, and 10 with supper.  I advised Vaughan Basta.

## 2019-03-06 ENCOUNTER — Other Ambulatory Visit: Payer: Self-pay

## 2019-03-06 DIAGNOSIS — M25579 Pain in unspecified ankle and joints of unspecified foot: Secondary | ICD-10-CM

## 2019-03-06 DIAGNOSIS — I739 Peripheral vascular disease, unspecified: Secondary | ICD-10-CM

## 2019-03-08 NOTE — Progress Notes (Signed)
   Subjective:  78 y.o. female with PMHx of diabetes mellitus presenting today with a chief complaint of an ulceration of the right ankle that appeared two weeks ago. She states the wound started as a blister. She reports itching to the area that began 8 months ago. She states her feet seem to be darker than they were in the past. She has not done anything for treatment. There are no modifying factors noted. Patient is here for further evaluation and treatment.   Past Medical History:  Diagnosis Date  . Alpha thalassemia trait 01/26/2010   02/2012: Nl CBC ex H&H-10.7/34.8, MCV-69   . Anemia   . Anxiety   . Anxiety and depression   . Cellulitis 05/09/2017  . Depression   . Diabetes mellitus   . GERD (gastroesophageal reflux disease)   . Headache(784.0)   . Hyperlipidemia   . Hypertension   . Iron deficiency 01/21/2017  . Microcytic anemia 01/26/2010   02/2012: Nl CBC ex H&H-10.7/34.8, MCV-69   . Obesity   . Obstructive sleep apnea   . Osteoarthritis    Left knee; right shoulder; chronic neck and back pain  . Pruritus   . Seizures ([place])   . Tremor    This started months ago after her seizure progressing to very poor hand writing  . Urinary incontinence   . UTI (urinary tract infection) 01/18/2013      Objective/Physical Exam General: The patient is alert and oriented x3 in no acute distress.  Dermatology:  Wound #1 noted to the right ankle measuring 4.0 x 4.0 x 0.1 cm (LxWxD).   To the noted ulceration(s), there is no eschar. There is a moderate amount of slough, fibrin, and necrotic tissue noted. Granulation tissue and wound base is red. There is a minimal amount of serosanguineous drainage noted. There is no exposed bone muscle-tendon ligament or joint. There is no malodor. Periwound integrity is intact. Skin is warm, dry and supple bilateral lower extremities.  Vascular: Diminished pedal pulses bilaterally. No edema or erythema noted. Capillary refill within normal  limits.  Neurological: Epicritic and protective threshold diminished bilaterally.   Musculoskeletal Exam: Range of motion within normal limits to all pedal and ankle joints bilateral. Muscle strength 5/5 in all groups bilateral.   Assessment: 1. Ulceration of the right ankle secondary to diabetes mellitus 2. diabetes mellitus w/ peripheral neuropathy 3. PVD BLE   Plan of Care:  1. Patient was evaluated. 2. medically necessary excisional debridement including subcutaneous tissue was performed using a tissue nipper and a chisel blade. Excisional debridement of all the necrotic nonviable tissue down to healthy bleeding viable tissue was performed with post-debridement measurements same as pre-. 3. the wound was cleansed and dry sterile dressing applied. 4. Silvadene cream provided.  5. LLE arterial dopplers pending.  6. Patient is to return to clinic in 2 weeks.   Edrick Kins, DPM [facility]  Dr. Edrick Kins, [address]                                        [place], [place] [address]                Office [phone]  Fax [phone]

## 2019-03-08 NOTE — Patient Instructions (Signed)
Read over instructions and call if questions.

## 2019-03-08 NOTE — Progress Notes (Signed)
Laura Atkins was trained on how to use the LIbre CGM.  We discussed the differences between the sensor reading and the blood sugar.  She put in a sensor into her left abdomen and started the sensor using her reader.  She had no final questions. We reviewed the need of when to test blood sugar readings, and she reported good understanding of this.

## 2019-03-09 ENCOUNTER — Ambulatory Visit (HOSPITAL_COMMUNITY): Payer: Medicare HMO

## 2019-03-10 ENCOUNTER — Ambulatory Visit (HOSPITAL_COMMUNITY)
Admission: RE | Admit: 2019-03-10 | Discharge: 2019-03-10 | Disposition: A | Discharge status: Home / Self Care | Payer: Medicare HMO | Source: Ambulatory Visit | Attending: Vascular Surgery | Admitting: Vascular Surgery

## 2019-03-10 ENCOUNTER — Other Ambulatory Visit: Payer: Self-pay

## 2019-03-10 ENCOUNTER — Ambulatory Visit (INDEPENDENT_AMBULATORY_CARE_PROVIDER_SITE_OTHER): Payer: Medicare HMO | Admitting: Vascular Surgery

## 2019-03-10 VITALS — BP 131/68 | HR 70 | Temp 97.8°F | Resp 14 | Ht 68.5 in | Wt 267.0 lb

## 2019-03-10 DIAGNOSIS — I739 Peripheral vascular disease, unspecified: Secondary | ICD-10-CM

## 2019-03-10 DIAGNOSIS — M25579 Pain in unspecified ankle and joints of unspecified foot: Secondary | ICD-10-CM

## 2019-03-10 NOTE — Progress Notes (Signed)
Patient name: Laura Atkins MRN: [id number] DOB: Jan 13, 1941 Sex: female  REASON FOR CONSULT: Evaluate for diminished pulses and venous insufficiency, right leg  HPI: Laura Atkins is a 78 y.o. female, with multiple medical problems including obesity, diabetes, hypertension, hyperlipidemia that presents for evaluation of decreased pedal pulses as well as possible venous insufficiency in the setting of a right leg wound.  Patient states that her wound has been present for about a week after she scraped her leg.  The wound is on the right anterior shin just above the ankle.  She denies any previous lower extremity revascularizations or venous interventions.  No significant rest pain otherwise limiting claudication, but she has difficulty and ambulates with a cane.  She underwent ABIs today prior to her visit.  She states she has been putting cream on the wound that was recommended by pharmacist.  Past Medical History:  Diagnosis Date  . Alpha thalassemia trait 01/26/2010   02/2012: Nl CBC ex H&H-10.7/34.8, MCV-69   . Anemia   . Anxiety   . Anxiety and depression   . Cellulitis 05/09/2017  . Depression   . Diabetes mellitus   . GERD (gastroesophageal reflux disease)   . Headache(784.0)   . Hyperlipidemia   . Hypertension   . Iron deficiency 01/21/2017  . Microcytic anemia 01/26/2010   02/2012: Nl CBC ex H&H-10.7/34.8, MCV-69   . Obesity   . Obstructive sleep apnea   . Osteoarthritis    Left knee; right shoulder; chronic neck and back pain  . Pruritus   . Seizures ([place])   . Tremor    This started months ago after her seizure progressing to very poor hand writing  . Urinary incontinence   . UTI (urinary tract infection) 01/18/2013    Past Surgical History:  Procedure Laterality Date  . ABDOMINAL HYSTERECTOMY    . BREAST EXCISIONAL BIOPSY     Left; cyst  . CATARACT EXTRACTION Right    12/2017  . CATARACT EXTRACTION W/ INTRAOCULAR LENS IMPLANT Left 09/07/2013  . CHOLECYSTECTOMY     . COLONOSCOPY    . COLONOSCOPY N/A 07/20/2015   Procedure: COLONOSCOPY;  Surgeon: Rogene Houston, MD;  Location: [facility];  Service: Endoscopy;  Laterality: N/A;  930  . EYE SURGERY Left 09/07/2013   cataract    Family History  Problem Relation Age of Onset  . Lung cancer Mother 28  . Kidney disease Father   . Diabetes Sister   . Keloids Brother   . ADD / ADHD Grandchild   . Bipolar disorder Grandchild   . Bipolar disorder Daughter   . Seizures Daughter   . Heart disease Daughter   . Kidney disease Son   . Neuropathy Son   . Kidney disease Son   . Edema Daughter   . Breast cancer Daughter 74  . Allergies Daughter   . Alcohol abuse Neg Hx   . Drug abuse Neg Hx     SOCIAL HISTORY: Social History   Socioeconomic History  . Marital status: Married    Spouse name: saunders  . Number of children: 5  . Years of education: 52  . Highest education level: Not on file  Occupational History  . Occupation: Disabled     Employer: RETIRED  Social Needs  . Financial resource strain: Not hard at all  . Food insecurity    Worry: Never true    Inability: Never true  . Transportation needs    Medical: No  Non-medical: No  Tobacco Use  . Smoking status: Never Smoker  . Smokeless tobacco: Never Used  Substance and Sexual Activity  . Alcohol use: No    Alcohol/week: 0.0 standard drinks  . Drug use: No  . Sexual activity: Yes    Birth control/protection: Surgical  Lifestyle  . Physical activity    Days per week: 0 days    Minutes per session: 0 min  . Stress: Only a little  Relationships  . Social Herbalist on phone: Twice a week    Gets together: Twice a week    Attends religious service: More than 4 times per year    Active member of club or organization: Yes    Attends meetings of clubs or organizations: More than 4 times per year    Relationship status: Married  . Intimate partner violence    Fear of current or ex partner: No    Emotionally  abused: No    Physically abused: No    Forced sexual activity: No  Other Topics Concern  . Not on file  Social History Narrative   Patient lives at home with her husband [name].  Patient is retired.    Right handed.    Five Children.    Caffeine- 2 daily    Allergies  Allergen Reactions  . Penicillins Shortness Of Breath, Itching and Rash  . Prednisone Shortness Of Breath, Itching and Rash  . Propoxyphene N-Acetaminophen Itching and Nausea And Vomiting    Current Outpatient Medications  Medication Sig Dispense Refill  . acetaminophen (EXTRA STRENGTH PAIN RELIEF) 500 MG tablet Take 500 mg by mouth every 6 (six) hours as needed for pain.    Marland Kitchen amLODipine (NORVASC) 10 MG tablet TAKE 1 TABLET BY MOUTH EVERY DAY 30 tablet 2  . aspirin 81 MG tablet Take 81 mg by mouth daily.      Marland Kitchen atorvastatin (LIPITOR) 40 MG tablet TAKE 1 TABLET BY MOUTH EVERY DAY 30 tablet 2  . benazepril (LOTENSIN) 20 MG tablet TAKE 1 TABLET BY MOUTH EVERY DAY 30 tablet 2  . buPROPion (WELLBUTRIN XL) 150 MG 24 hr tablet Take 1 tablet (150 mg total) by mouth every morning. 30 tablet 2  . diclofenac (VOLTAREN) 75 MG EC tablet Take 1 tablet (75 mg total) by mouth 2 (two) times daily. 30 tablet 0  . diphenhydrAMINE (BENADRYL) 25 mg capsule Take 25 mg by mouth at bedtime. Reported on 10/18/2015    . gabapentin (NEURONTIN) 400 MG capsule TAKE ONE CAPSULE BY MOUTH EVERY MORNING and TAKE ONE CAPSULE AT NOON and TAKE TWO CAPSULES AT BEDTIME 360 capsule 1  . ibuprofen (ADVIL,MOTRIN) 800 MG tablet     . insulin NPH-regular Human (NOVOLIN 70/30) (70-30) 100 UNIT/ML injection 70 units with breakfast, and 10 units with supper, and syringes 2/day. (Patient taking differently: 70 units with breakfast, and 20 units with supper, and syringes 2/day.) 30 mL 11  . Insulin Pen Needle 31G X 8 MM MISC Used to give daily insulin injections. 100 each prn  . Multiple Vitamin (MULTIVITAMIN) capsule Take 1 capsule by mouth daily.    . mupirocin  ointment (BACTROBAN) 2 % Apply to right heel once daily 22 g 1  . omeprazole (PRILOSEC) 20 MG capsule TAKE ONE CAPSULE BY MOUTH EVERY DAY 30 capsule 2  . ONE TOUCH ULTRA TEST test strip USE TO TEST BLOOD SUGAR FOUR TIMES DAILY 200 each 0  . potassium chloride SA (K-DUR,KLOR-CON) 20 MEQ tablet TAKE  1 TABLET BY MOUTH TWICE DAILY 180 tablet 1  . sertraline (ZOLOFT) 100 MG tablet Take 1 tablet (100 mg total) by mouth daily. 60 tablet 5  . torsemide (DEMADEX) 20 MG tablet TAKE TWO TABLETS BY MOUTH EVERY DAY (STOP lasix) 180 tablet 3  . traMADol (ULTRAM) 50 MG tablet Take 1 tablet (50 mg total) by mouth every 8 (eight) hours as needed. 30 tablet 0  . UNABLE TO FIND Walker x 1  DX unsteady gait, osteoarthritis of left knee 1 each 0  . UNABLE TO FIND Elevated [place] Seat x 1 DX: unsteady gait, back pain, osteoarthritis of left knee 1 each 0   No current facility-administered medications for this visit.     REVIEW OF SYSTEMS:  [X]  denotes positive finding, [ ]  denotes negative finding Cardiac  Comments:  Chest pain or chest pressure:    Shortness of breath upon exertion:    Short of breath when lying flat:    Irregular heart rhythm:        Vascular    Pain in calf, thigh, or hip brought on by ambulation:    Pain in feet at night that wakes you up from your sleep:     Blood clot in your veins:    Leg swelling:         Pulmonary    Oxygen at home:    Productive cough:     Wheezing:         Neurologic    Sudden weakness in arms or legs:     Sudden numbness in arms or legs:     Sudden onset of difficulty speaking or slurred speech:    Temporary loss of vision in one eye:     Problems with dizziness:         Gastrointestinal    Blood in stool:     Vomited blood:         Genitourinary    Burning when urinating:     Blood in urine:        Psychiatric    Major depression:         Hematologic    Bleeding problems:    Problems with blood clotting too easily:        Skin     Rashes or ulcers:        Constitutional    Fever or chills:      PHYSICAL EXAM: Vitals:   03/10/19 1410  BP: 131/68  Pulse: 70  Resp: 14  Temp: 97.8 F (36.6 C)  TempSrc: Temporal  SpO2: 99%  Weight: 267 lb (121.1 kg)  Height: 5' 8.5" (1.74 m)    GENERAL: The patient is a well-nourished female, in no acute distress. The vital signs are documented above. CARDIAC: There is a regular rate and rhythm.  VASCULAR:  Femoral pulse palpable 2+ bilaterally DP pulses 2+ palpable bilaterally Some circumferential skin thickening above the ankle as pictured below with discoloration Right leg wound above ankle PULMONARY: There is good air exchange bilaterally without wheezing or rales. ABDOMEN: Soft and non-tender with normal pitched bowel sounds.  MUSCULOSKELETAL: There are no major deformities or cyanosis. NEUROLOGIC: No focal weakness or paresthesias are detected. SKIN: There are no ulcers or rashes noted. PSYCHIATRIC: The patient has a normal affect.      DATA:   ABIs are largely noncompressible but she has triphasic waveforms at the ankle with adequate toe pressures 122 on the right 131 on the left  Assessment/Plan:  I discussed with Ms. Fujikawa that I do not think she has significant arterial disease that warrants intervention.  She has bounding dorsalis pedis pulses that are palpable in both feet with triphasic waveforms at the ankles and toe pressures over 120 which should be more than adequate for wound healing.  My suspicion would be that this is more consistent with venous changes if anything.  As a result, I have recommended lower extremity venous reflux study and have her follow-up.  She would like to get her studies and be seen the same day, which NP clinic would be fine.  Certainly if she had significant superficial reflux that we consider pathologic we could arrange an Unna boot in wound clinic and ultimate evaluate for venous intervention.  I also discussed with her that  given her wound has only been present for a week she may find that this heals fairly quickly given what appears to be adequate arterial inflow especially if she does not have significant venous disease that would impair healing.   Marty Heck, MD [facility] Office: [phone] Pager: [phone]

## 2019-03-16 ENCOUNTER — Other Ambulatory Visit: Payer: Self-pay

## 2019-03-16 DIAGNOSIS — Z794 Long term (current) use of insulin: Secondary | ICD-10-CM

## 2019-03-16 DIAGNOSIS — E1165 Type 2 diabetes mellitus with hyperglycemia: Secondary | ICD-10-CM

## 2019-03-16 MED ORDER — ACCU-CHEK GUIDE VI STRP: ORAL_STRIP | 12 refills | Status: DC

## 2019-03-16 MED ORDER — ACCU-CHEK FASTCLIX LANCETS MISC: 1.0000 | Freq: Four times a day (QID) | 2 refills | Status: DC

## 2019-03-18 ENCOUNTER — Other Ambulatory Visit: Payer: Self-pay

## 2019-03-18 ENCOUNTER — Ambulatory Visit (INDEPENDENT_AMBULATORY_CARE_PROVIDER_SITE_OTHER): Payer: Medicare HMO | Admitting: Podiatry

## 2019-03-18 VITALS — Temp 97.7°F

## 2019-03-18 DIAGNOSIS — E0843 Diabetes mellitus due to underlying condition with diabetic autonomic (poly)neuropathy: Secondary | ICD-10-CM | POA: Diagnosis not present

## 2019-03-18 DIAGNOSIS — L97312 Non-pressure chronic ulcer of right ankle with fat layer exposed: Secondary | ICD-10-CM

## 2019-03-18 DIAGNOSIS — I872 Venous insufficiency (chronic) (peripheral): Secondary | ICD-10-CM | POA: Diagnosis not present

## 2019-03-21 NOTE — Progress Notes (Signed)
   Subjective:  78 y.o. female with PMHx of diabetes mellitus presenting today for follow up evaluation of an ulceration of the right ankle. She is concerned the wound is not healing appropriately. She has been using Silvadene cream as directed and denies any modifying factors. Patient is here for further evaluation and treatment.   Past Medical History:  Diagnosis Date  . Alpha thalassemia trait 01/26/2010   02/2012: Nl CBC ex H&H-10.7/34.8, MCV-69   . Anemia   . Anxiety   . Anxiety and depression   . Cellulitis 05/09/2017  . Depression   . Diabetes mellitus   . GERD (gastroesophageal reflux disease)   . Headache(784.0)   . Hyperlipidemia   . Hypertension   . Iron deficiency 01/21/2017  . Microcytic anemia 01/26/2010   02/2012: Nl CBC ex H&H-10.7/34.8, MCV-69   . Obesity   . Obstructive sleep apnea   . Osteoarthritis    Left knee; right shoulder; chronic neck and back pain  . Pruritus   . Seizures ([place])   . Tremor    This started months ago after her seizure progressing to very poor hand writing  . Urinary incontinence   . UTI (urinary tract infection) 01/18/2013      Objective/Physical Exam General: The patient is alert and oriented x3 in no acute distress.  Dermatology:  Wound #1 noted to the right ankle measuring 2.5 x 1.5 x 0.2 cm (LxWxD).   To the noted ulceration(s), there is no eschar. There is a moderate amount of slough, fibrin, and necrotic tissue noted. Granulation tissue and wound base is red. There is a minimal amount of serosanguineous drainage noted. There is no exposed bone muscle-tendon ligament or joint. There is no malodor. Periwound integrity is intact. Skin is warm, dry and supple bilateral lower extremities.  Vascular: Palpable pedal pulses bilaterally. No edema or erythema noted. Capillary refill within normal limits.  Neurological: Epicritic and protective threshold diminished bilaterally.   Musculoskeletal Exam: Range of motion within normal limits to  all pedal and ankle joints bilateral. Muscle strength 5/5 in all groups bilateral.   Assessment: 1. Ulceration of the right ankle secondary to diabetes mellitus 2. diabetes mellitus w/ peripheral neuropathy 3. Venous insufficiency RLE   Plan of Care:  1. Patient was evaluated. 2. medically necessary excisional debridement including subcutaneous tissue was performed using a tissue nipper and a chisel blade. Excisional debridement of all the necrotic nonviable tissue down to healthy bleeding viable tissue was performed with post-debridement measurements same as pre-. 3. the wound was cleansed and dry sterile dressing applied. 4. Unna boot multilayer compression wrap applied. Keep clean, dry and intact for one week.  5. Return to clinic one week.    Edrick Kins, DPM [facility]  Dr. Edrick Kins, [place]                                        [place], [place] [address]                Office [phone]  Fax [phone]

## 2019-03-23 ENCOUNTER — Other Ambulatory Visit: Payer: Self-pay

## 2019-03-23 ENCOUNTER — Telehealth: Payer: Self-pay | Admitting: Endocrinology

## 2019-03-23 DIAGNOSIS — I739 Peripheral vascular disease, unspecified: Secondary | ICD-10-CM

## 2019-03-23 NOTE — Telephone Encounter (Signed)
Please review pt request. To my understanding, in order to qualify, wouldn't pt need to monitor glucose levels 4 or more times per day and be on insulin that would need to be administered 3 or more times per day? Please advise

## 2019-03-23 NOTE — Telephone Encounter (Signed)
Patient requests that a new RX for a Freestyle or something similar so that patient does not have to stick a needle in herself all the time (patient saw a commercial). Patient's PHARM is [facility]. Please call patient at ph# [phone] to advise.

## 2019-03-24 ENCOUNTER — Other Ambulatory Visit: Payer: Self-pay

## 2019-03-24 ENCOUNTER — Ambulatory Visit (HOSPITAL_COMMUNITY): Payer: Medicare HMO | Admitting: Psychiatry

## 2019-03-26 ENCOUNTER — Other Ambulatory Visit: Payer: Self-pay

## 2019-03-26 ENCOUNTER — Encounter: Payer: Self-pay | Admitting: Family

## 2019-03-26 ENCOUNTER — Ambulatory Visit (HOSPITAL_COMMUNITY)
Admission: RE | Admit: 2019-03-26 | Discharge: 2019-03-26 | Disposition: A | Discharge status: Home / Self Care | Payer: Medicare HMO | Source: Ambulatory Visit | Attending: Family | Admitting: Family

## 2019-03-26 ENCOUNTER — Ambulatory Visit (INDEPENDENT_AMBULATORY_CARE_PROVIDER_SITE_OTHER): Payer: Medicare HMO | Admitting: Family

## 2019-03-26 VITALS — BP 127/61 | Temp 97.7°F | Resp 14 | Ht 69.0 in | Wt 270.9 lb

## 2019-03-26 DIAGNOSIS — T1490XD Injury, unspecified, subsequent encounter: Secondary | ICD-10-CM

## 2019-03-26 DIAGNOSIS — I739 Peripheral vascular disease, unspecified: Secondary | ICD-10-CM | POA: Diagnosis not present

## 2019-03-26 DIAGNOSIS — L299 Pruritus, unspecified: Secondary | ICD-10-CM

## 2019-03-26 NOTE — Progress Notes (Signed)
CC: venous reflux duplex imaging of lower extremities to evaluate for venous reflux, healing wound of right ankle, has been evaluated for PAD  History of Present Illness  Laura Atkins is a 78 y.o. ([birth year]) female whom Dr. Carlis Abbott initially evaluated on 03-10-19 for evaluation of decreased pedal pulses as well as possible venous insufficiency in the setting of a right leg wound.  She returns today for venous reflux duplex imaging of her lower extremities.   She has multiple medical problems including obesity, diabetes, hypertension, hyperlipidemia.  Her wound had  been present for about a week after she scraped her leg.  The wound is on the right anterior shin just above the ankle.  She denies any previous lower extremity revascularizations or venous interventions.  No significant rest pain otherwise limiting claudication, but she has difficulty and ambulates with a cane.  At her 03-10-19 visit, Dr. Carlis Abbott indicates that he did not think she has significant arterial disease that warrants intervention.  She had bounding dorsalis pedis pulses that were palpable in both feet with triphasic waveforms at the ankles and toe pressures over 120 which should be more than adequate for wound healing.  His suspicion would be that this is more consistent with venous changes if anything.  As a result, he recommended lower extremity venous reflux study and have her follow-up.  She would like to get her studies and be seen the same day, which NP clinic would be fine.  Certainly if she had significant superficial reflux that we consider pathologic we could arrange an Unna boot in wound clinic and ultimate evaluate for venous intervention. Dr. Carlis Abbott also discussed with her that given her wound has only been present for a week she may find that this heals fairly quickly given what appears to be adequate arterial inflow especially if she does not have significant venous disease that would impair healing.  She had a  unna boot short dressing at her right lower leg and ankle which had been applied at her visit on 03-10-19, according to pt. This was removed today for wound inspection.  She states the wound looks improved, she denies any pain, denies fever or chills.   Past Medical History:  Diagnosis Date  . Alpha thalassemia trait 01/26/2010   02/2012: Nl CBC ex H&H-10.7/34.8, MCV-69   . Anemia   . Anxiety   . Anxiety and depression   . Cellulitis 05/09/2017  . Depression   . Diabetes mellitus   . GERD (gastroesophageal reflux disease)   . Headache(784.0)   . Hyperlipidemia   . Hypertension   . Iron deficiency 01/21/2017  . Microcytic anemia 01/26/2010   02/2012: Nl CBC ex H&H-10.7/34.8, MCV-69   . Obesity   . Obstructive sleep apnea   . Osteoarthritis    Left knee; right shoulder; chronic neck and back pain  . Pruritus   . Seizures ([place])   . Tremor    This started months ago after her seizure progressing to very poor hand writing  . Urinary incontinence   . UTI (urinary tract infection) 01/18/2013    Social History Social History   Tobacco Use  . Smoking status: Never Smoker  . Smokeless tobacco: Never Used  Substance Use Topics  . Alcohol use: No    Alcohol/week: 0.0 standard drinks  . Drug use: No    Family History Family History  Problem Relation Age of Onset  . Lung cancer Mother 48  . Kidney disease Father   .  Diabetes Sister   . Keloids Brother   . ADD / ADHD Grandchild   . Bipolar disorder Grandchild   . Bipolar disorder Daughter   . Seizures Daughter   . Heart disease Daughter   . Kidney disease Son   . Neuropathy Son   . Kidney disease Son   . Edema Daughter   . Breast cancer Daughter 6  . Allergies Daughter   . Alcohol abuse Neg Hx   . Drug abuse Neg Hx     Surgical History Past Surgical History:  Procedure Laterality Date  . ABDOMINAL HYSTERECTOMY    . BREAST EXCISIONAL BIOPSY     Left; cyst  . CATARACT EXTRACTION Right    12/2017  . CATARACT EXTRACTION  W/ INTRAOCULAR LENS IMPLANT Left 09/07/2013  . CHOLECYSTECTOMY    . COLONOSCOPY    . COLONOSCOPY N/A 07/20/2015   Procedure: COLONOSCOPY;  Surgeon: Rogene Houston, MD;  Location: [facility];  Service: Endoscopy;  Laterality: N/A;  930  . EYE SURGERY Left 09/07/2013   cataract    Allergies  Allergen Reactions  . Penicillins Shortness Of Breath, Itching and Rash  . Prednisone Shortness Of Breath, Itching and Rash  . Propoxyphene N-Acetaminophen Itching and Nausea And Vomiting    Current Outpatient Medications  Medication Sig Dispense Refill  . Accu-Chek FastClix Lancets MISC 1 each by Does not apply route 4 (four) times daily. USE TO TEST BLOOD SUGAR FOUR TIMES DAILY; E11.65 102 each 2  . acetaminophen (EXTRA STRENGTH PAIN RELIEF) 500 MG tablet Take 500 mg by mouth every 6 (six) hours as needed for pain.    Marland Kitchen amLODipine (NORVASC) 10 MG tablet TAKE 1 TABLET BY MOUTH EVERY DAY 30 tablet 2  . aspirin 81 MG tablet Take 81 mg by mouth daily.      Marland Kitchen atorvastatin (LIPITOR) 40 MG tablet TAKE 1 TABLET BY MOUTH EVERY DAY 30 tablet 2  . benazepril (LOTENSIN) 20 MG tablet TAKE 1 TABLET BY MOUTH EVERY DAY 30 tablet 2  . buPROPion (WELLBUTRIN XL) 150 MG 24 hr tablet Take 1 tablet (150 mg total) by mouth every morning. 30 tablet 2  . diclofenac (VOLTAREN) 75 MG EC tablet Take 1 tablet (75 mg total) by mouth 2 (two) times daily. 30 tablet 0  . diphenhydrAMINE (BENADRYL) 25 mg capsule Take 25 mg by mouth at bedtime. Reported on 10/18/2015    . gabapentin (NEURONTIN) 400 MG capsule TAKE ONE CAPSULE BY MOUTH EVERY MORNING and TAKE ONE CAPSULE AT NOON and TAKE TWO CAPSULES AT BEDTIME 360 capsule 1  . glucose blood (ACCU-CHEK GUIDE) test strip USE TO TEST BLOOD SUGAR FOUR TIMES DAILY; E11.65 100 each 12  . ibuprofen (ADVIL,MOTRIN) 800 MG tablet     . insulin NPH-regular Human (NOVOLIN 70/30) (70-30) 100 UNIT/ML injection 70 units with breakfast, and 10 units with supper, and syringes 2/day. (Patient taking  differently: 70 units with breakfast, and 20 units with supper, and syringes 2/day.) 30 mL 11  . Insulin Pen Needle 31G X 8 MM MISC Used to give daily insulin injections. 100 each prn  . Multiple Vitamin (MULTIVITAMIN) capsule Take 1 capsule by mouth daily.    . mupirocin ointment (BACTROBAN) 2 % Apply to right heel once daily 22 g 1  . omeprazole (PRILOSEC) 20 MG capsule TAKE ONE CAPSULE BY MOUTH EVERY DAY 30 capsule 2  . potassium chloride SA (K-DUR,KLOR-CON) 20 MEQ tablet TAKE 1 TABLET BY MOUTH TWICE DAILY 180 tablet 1  .  sertraline (ZOLOFT) 100 MG tablet Take 1 tablet (100 mg total) by mouth daily. 60 tablet 5  . torsemide (DEMADEX) 20 MG tablet TAKE TWO TABLETS BY MOUTH EVERY DAY (STOP lasix) 180 tablet 3  . traMADol (ULTRAM) 50 MG tablet Take 1 tablet (50 mg total) by mouth every 8 (eight) hours as needed. 30 tablet 0  . UNABLE TO FIND Walker x 1  DX unsteady gait, osteoarthritis of left knee 1 each 0  . UNABLE TO FIND Elevated Toliet Seat x 1 DX: unsteady gait, back pain, osteoarthritis of left knee 1 each 0   No current facility-administered medications for this visit.     REVIEW OF SYSTEMS: see HPI for pertinent positives and negatives   Physical Examination  Vitals:   03/26/19 1519  BP: 127/61  Resp: 14  Temp: 97.7 F (36.5 C)  TempSrc: Temporal  SpO2: 97%  Weight: 270 lb 14.4 oz (122.9 kg)  Height: 5\' 9"  (1.753 m)   Body mass index is 40 kg/m.  General: Morbidly obese female appears her stated age.  She ambulates with a walker.  HEENT:  No gross abnormalities Pulmonary: Respirations are non-labored Abdomen: Soft and non-tender  Musculoskeletal: There are no major deformities.   Neurologic: No focal weakness or paresthesias are detected. Skin: See photo below    Psychiatric: The patient has normal affect. Cardiovascular: There is a regular rate and rhythm without significant murmur appreciated.    Vascular: Vessel Right Left  Aorta Not palpable N/A   Femoral Not Palpable seated, morbidly obese Not Palpable  Popliteal Not palpable Not palpable  PT Not Palpable Not Palpable  DP 2+Palpable Not Palpable     DATA  BLE Venous Duplex (Date: 03/26/2019):  +------------------------------+----------+---------+ VEIN DIAMETERS:               Right (cm)Left (cm) +------------------------------+----------+---------+ GSV at Saphenofemoral junction0.756     0.773     +------------------------------+----------+---------+ GSV at prox thigh             0.644     0.637     +------------------------------+----------+---------+ GSV at mid thigh              0.702     0.562     +------------------------------+----------+---------+ GSV at distal thigh           0.584     0.387     +------------------------------+----------+---------+ GSV at knee                   0.502     0.614     +------------------------------+----------+---------+ GSV prox calf                 0.492     0.252     +------------------------------+----------+---------+ GSV mid calf                  0.502     0.369     +------------------------------+----------+---------+ SSV origin                    0.410     0.552     +------------------------------+----------+---------+ SSV prox                      0.492     0.292     +------------------------------+----------+---------+ SSV mid                       0.289     0.398     +------------------------------+----------+---------+  Right Reflux Technical Findings: No evidence of DVT, SVT, or Baker's cyst. The sapheno-femoral junction is competent. No evidence of GSV reflux. No evidence of SSV reflux. No evidence of deep venous reflux. Left Reflux Technical Findings: No evidence of DVT, SVT, or Baker's cyst. The sapheno-femoral junction is competent. No evidence of GSV reflux. No evidence of SSV reflux. No evidence of deep venous reflux.   Summary: Right: No reflux was noted in  the common femoral vein, femoral vein in the thigh, popliteal vein, great saphenous vein at the saphenofemoral junction, great saphenous vein at the proximal thigh, great saphenous vein at the mid thigh, great saphenous vein  at the distal thigh, great saphenous vein at the knee, proximal small saphenous vein, and mid small saphenous vein. There is no evidence of deep vein thrombosis in the lower extremity. There is no evidence of superficial venous thrombosis. No cystic  structure found in the popliteal fossa. Left: No reflux was noted in the common femoral vein , femoral vein in the thigh, popliteal vein, great saphenous vein at the saphenofemoral junction, great saphenous vein at the proximal thigh, great saphenous vein at the mid thigh, great saphenous vein  at the distal thigh, great saphenous vein at the knee, proximal small saphenous vein, and mid small saphenous vein. There is no evidence of deep vein thrombosis in the lower extremity. There is no evidence of superficial venous thrombosis. No cystic  structure found in the popliteal fossa.   Medical Decision Making  TZIREL LEONOR is a 78 y.o. female who presents with: a healing and contracting shallow wound at anterior aspect right lower leg/ankle. No venous reflux found on today's venous duplex of her legs. Palpable right DP pulse.  Her wound started as a severely pruritic dark spot at the dorsal aspect of her right ankle/lower leg. She had this same pruritic wound in the same area on two other occassions in the last few years, was treated at [facility] ED. All 3 times the severe pruritus started as a dark spot. She also had tingling then pruritus in all fingers of both hands.    Will refer to dermatology for evaluation of recurrent pruritic lesion right foot and tingling in her fingers. She states she has seen Dr. Nevada Crane in [place] to help her with this. It is unclear from pt if Dr. Nevada Crane is a dermatologist.   Will refer to dermatologist close  to her in [place], if possible, since she does not drive.    In the meantime, advise that her PCP manage her local wound care of healing shallow wound at anterior lower leg/ankle.  She indicates multiple environmental sensitivities, I am reluctant to prescribe a topical antibiotic such as triple antibiotic ointment or prescription mupirocin.   Will again apply short unna boot medicated dressing which is used for venous ulcers, this is not a venous ulcer since she does not have any venous reflux, but she had no adverse reaction to the medicated dressing, and it may take a few weeks for her to get to see a dermatologist.   She does not think that she will be able to get in to see her PCP within a week.   Pt to return in a week for removal of short unna boot dressing, and wound check. The wound has healed a great deal already compared to 03-10-19 visit photo.   Clemon Chambers, RN, MSN, FNP-C [facility] Office: [phone]  03/26/2019, 3:22 PM  Clinic MD: Scot Dock

## 2019-03-30 ENCOUNTER — Ambulatory Visit: Payer: Medicare HMO | Admitting: Podiatry

## 2019-03-30 ENCOUNTER — Encounter: Payer: Self-pay | Admitting: Podiatry

## 2019-03-30 ENCOUNTER — Other Ambulatory Visit: Payer: Self-pay

## 2019-03-30 VITALS — Temp 97.3°F

## 2019-03-30 DIAGNOSIS — I872 Venous insufficiency (chronic) (peripheral): Secondary | ICD-10-CM

## 2019-03-30 DIAGNOSIS — E0843 Diabetes mellitus due to underlying condition with diabetic autonomic (poly)neuropathy: Secondary | ICD-10-CM | POA: Diagnosis not present

## 2019-03-30 DIAGNOSIS — L97312 Non-pressure chronic ulcer of right ankle with fat layer exposed: Secondary | ICD-10-CM

## 2019-03-31 ENCOUNTER — Ambulatory Visit (INDEPENDENT_AMBULATORY_CARE_PROVIDER_SITE_OTHER): Payer: Medicare HMO | Admitting: Endocrinology

## 2019-03-31 ENCOUNTER — Other Ambulatory Visit: Payer: Self-pay

## 2019-03-31 ENCOUNTER — Encounter: Payer: Self-pay | Admitting: Endocrinology

## 2019-03-31 VITALS — BP 126/54 | HR 75 | Ht 69.0 in | Wt 273.8 lb

## 2019-03-31 DIAGNOSIS — Z794 Long term (current) use of insulin: Secondary | ICD-10-CM | POA: Diagnosis not present

## 2019-03-31 DIAGNOSIS — E1165 Type 2 diabetes mellitus with hyperglycemia: Secondary | ICD-10-CM | POA: Diagnosis not present

## 2019-03-31 LAB — POCT GLYCOSYLATED HEMOGLOBIN (HGB A1C): Hemoglobin A1C: 9.3 % — AB (ref 4.0–5.6)

## 2019-03-31 MED ORDER — FREESTYLE LIBRE 14 DAY READER DEVI: 1.0000 | Freq: Once | 0 refills | Status: DC

## 2019-03-31 MED ORDER — NOVOLIN 70/30 (70-30) 100 UNIT/ML ~~LOC~~ SUSP: SUBCUTANEOUS | 11 refills | Status: DC

## 2019-03-31 MED ORDER — FREESTYLE LIBRE 14 DAY SENSOR MISC: 1.0000 | 3 refills | Status: DC

## 2019-03-31 NOTE — Progress Notes (Signed)
Subjective:    Patient ID: Laura Atkins, female    DOB: 1940/12/30, 78 y.o.   MRN: [id number]  HPI Pt returns for f/u of diabetes mellitus: DM type: insulin-requiring type 2.   Dx'ed: 2353 Complications: polyneuropathy, PAD, and foot ulcer.   Therapy: insulin since 2005.   GDM: never.  DKA: never Severe hypoglycemia: never.   Pancreatitis: never.   Other info: in early 2016, she changed to BID premixed insulin, due to poor results with multiple daily injections.  She takes human insulin, due to cost.   Interval history: pt says she sometimes misses the insulin.  She forgot cbg record.  Pt says she is uncertain how cbg's are.  She says granddaughter checks cbg.   Past Medical History:  Diagnosis Date  . Alpha thalassemia trait 01/26/2010   02/2012: Nl CBC ex H&H-10.7/34.8, MCV-69   . Anemia   . Anxiety   . Anxiety and depression   . Cellulitis 05/09/2017  . Depression   . Diabetes mellitus   . GERD (gastroesophageal reflux disease)   . Headache(784.0)   . Hyperlipidemia   . Hypertension   . Iron deficiency 01/21/2017  . Microcytic anemia 01/26/2010   02/2012: Nl CBC ex H&H-10.7/34.8, MCV-69   . Obesity   . Obstructive sleep apnea   . Osteoarthritis    Left knee; right shoulder; chronic neck and back pain  . Pruritus   . Seizures ([place])   . Tremor    This started months ago after her seizure progressing to very poor hand writing  . Urinary incontinence   . UTI (urinary tract infection) 01/18/2013    Past Surgical History:  Procedure Laterality Date  . ABDOMINAL HYSTERECTOMY    . BREAST EXCISIONAL BIOPSY     Left; cyst  . CATARACT EXTRACTION Right    12/2017  . CATARACT EXTRACTION W/ INTRAOCULAR LENS IMPLANT Left 09/07/2013  . CHOLECYSTECTOMY    . COLONOSCOPY    . COLONOSCOPY N/A 07/20/2015   Procedure: COLONOSCOPY;  Surgeon: Rogene Houston, MD;  Location: [facility];  Service: Endoscopy;  Laterality: N/A;  930  . EYE SURGERY Left 09/07/2013   cataract     Social History   Socioeconomic History  . Marital status: Married    Spouse name: saunders  . Number of children: 5  . Years of education: 4  . Highest education level: Not on file  Occupational History  . Occupation: Disabled     Employer: RETIRED  Social Needs  . Financial resource strain: Not hard at all  . Food insecurity    Worry: Never true    Inability: Never true  . Transportation needs    Medical: No    Non-medical: No  Tobacco Use  . Smoking status: Never Smoker  . Smokeless tobacco: Never Used  Substance and Sexual Activity  . Alcohol use: No    Alcohol/week: 0.0 standard drinks  . Drug use: No  . Sexual activity: Yes    Birth control/protection: Surgical  Lifestyle  . Physical activity    Days per week: 0 days    Minutes per session: 0 min  . Stress: Only a little  Relationships  . Social Herbalist on phone: Twice a week    Gets together: Twice a week    Attends religious service: More than 4 times per year    Active member of club or organization: Yes    Attends meetings of clubs or organizations: More than  4 times per year    Relationship status: Married  . Intimate partner violence    Fear of current or ex partner: No    Emotionally abused: No    Physically abused: No    Forced sexual activity: No  Other Topics Concern  . Not on file  Social History Narrative   Patient lives at home with her husband [name].  Patient is retired.    Right handed.    Five Children.    Caffeine- 2 daily    Current Outpatient Medications on File Prior to Visit  Medication Sig Dispense Refill  . Accu-Chek FastClix Lancets MISC 1 each by Does not apply route 4 (four) times daily. USE TO TEST BLOOD SUGAR FOUR TIMES DAILY; E11.65 102 each 2  . acetaminophen (EXTRA STRENGTH PAIN RELIEF) 500 MG tablet Take 500 mg by mouth every 6 (six) hours as needed for pain.    Marland Kitchen amLODipine (NORVASC) 10 MG tablet TAKE 1 TABLET BY MOUTH EVERY DAY 30 tablet 2  . aspirin  81 MG tablet Take 81 mg by mouth daily.      Marland Kitchen atorvastatin (LIPITOR) 40 MG tablet TAKE 1 TABLET BY MOUTH EVERY DAY 30 tablet 2  . benazepril (LOTENSIN) 20 MG tablet TAKE 1 TABLET BY MOUTH EVERY DAY 30 tablet 2  . buPROPion (WELLBUTRIN XL) 150 MG 24 hr tablet Take 1 tablet (150 mg total) by mouth every morning. 30 tablet 2  . diclofenac (VOLTAREN) 75 MG EC tablet Take 1 tablet (75 mg total) by mouth 2 (two) times daily. 30 tablet 0  . diphenhydrAMINE (BENADRYL) 25 mg capsule Take 25 mg by mouth at bedtime. Reported on 10/18/2015    . gabapentin (NEURONTIN) 400 MG capsule TAKE ONE CAPSULE BY MOUTH EVERY MORNING and TAKE ONE CAPSULE AT NOON and TAKE TWO CAPSULES AT BEDTIME 360 capsule 1  . glucose blood (ACCU-CHEK GUIDE) test strip USE TO TEST BLOOD SUGAR FOUR TIMES DAILY; E11.65 100 each 12  . ibuprofen (ADVIL,MOTRIN) 800 MG tablet     . Insulin Pen Needle 31G X 8 MM MISC Used to give daily insulin injections. 100 each prn  . Multiple Vitamin (MULTIVITAMIN) capsule Take 1 capsule by mouth daily.    . mupirocin ointment (BACTROBAN) 2 % Apply to right heel once daily 22 g 1  . omeprazole (PRILOSEC) 20 MG capsule TAKE ONE CAPSULE BY MOUTH EVERY DAY 30 capsule 2  . potassium chloride SA (K-DUR,KLOR-CON) 20 MEQ tablet TAKE 1 TABLET BY MOUTH TWICE DAILY 180 tablet 1  . sertraline (ZOLOFT) 100 MG tablet Take 1 tablet (100 mg total) by mouth daily. 60 tablet 5  . torsemide (DEMADEX) 20 MG tablet TAKE TWO TABLETS BY MOUTH EVERY DAY (STOP lasix) 180 tablet 3  . traMADol (ULTRAM) 50 MG tablet Take 1 tablet (50 mg total) by mouth every 8 (eight) hours as needed. 30 tablet 0  . UNABLE TO FIND Walker x 1  DX unsteady gait, osteoarthritis of left knee 1 each 0  . UNABLE TO FIND Elevated Toliet Seat x 1 DX: unsteady gait, back pain, osteoarthritis of left knee 1 each 0   No current facility-administered medications on file prior to visit.     Allergies  Allergen Reactions  . Penicillins Shortness Of Breath,  Itching and Rash  . Prednisone Shortness Of Breath, Itching and Rash  . Propoxyphene N-Acetaminophen Itching and Nausea And Vomiting    Family History  Problem Relation Age of Onset  . Lung cancer Mother  15  . Kidney disease Father   . Diabetes Sister   . Keloids Brother   . ADD / ADHD Grandchild   . Bipolar disorder Grandchild   . Bipolar disorder Daughter   . Seizures Daughter   . Heart disease Daughter   . Kidney disease Son   . Neuropathy Son   . Kidney disease Son   . Edema Daughter   . Breast cancer Daughter 28  . Allergies Daughter   . Alcohol abuse Neg Hx   . Drug abuse Neg Hx     BP (!) 126/54 (BP Location: Left Arm, Patient Position: Sitting, Cuff Size: Large)   Pulse 75   Ht 5\' 9"  (1.753 m)   Wt 273 lb 12.8 oz (124.2 kg)   SpO2 98%   BMI 40.43 kg/m    Review of Systems She denies hypoglycemia.      Objective:   Physical Exam VITAL SIGNS:  See vs page GENERAL: no distress Pulses: left DP pulse is present.  MSK: no deformity of the left foot or ankle.  CV: 1+ left leg edema.  Skin:  no ulcer on the left foot or ankle.  normal color and temp on the left foot and ankle.   Neuro: sensation is intact to touch on the feet.   ION:GEXBM is bilateral onychomycosis of the toenails.  right ankle is in an unna boot.     Lab Results  Component Value Date   HGBA1C 9.3 (A) 03/31/2019       Assessment & Plan:  Insulin-requiring type 2 DM, with PAD: worse.   Limited self-care ability: pt is advised to have granddaughter send cbg record with her.    Patient Instructions  I have sent a prescription to your pharmacy, for the continuous glucose monitor, but I don't know if your insurance pays for it.   check your blood sugar twice a day.  vary the time of day when you check, between before the 3 meals, and at bedtime.  also check if you have symptoms of your blood sugar being too high or too low.  please keep a record of the readings and bring it to your next  appointment here (or you can bring the meter itself).  You can write it on any piece of paper.  please call us sooner if your blood sugar goes below 70, or if you have a lot of readings over 200. Please increase the insulin to 80 units with breakfast, and 10 units with supper.   On this type of insulin schedule, you should eat meals on a regular schedule.  If a meal is missed or significantly delayed, your blood sugar could go low.   Please come back for a follow-up appointment in 2 months.  Please see Vaughan Basta or Mickel Baas the same day, to review sick-day management.   Please continue to work with Dr Moshe Cipro, on your memory loss.

## 2019-03-31 NOTE — Patient Instructions (Addendum)
I have sent a prescription to your pharmacy, for the continuous glucose monitor, but I don't know if your insurance pays for it.   check your blood sugar twice a day.  vary the time of day when you check, between before the 3 meals, and at bedtime.  also check if you have symptoms of your blood sugar being too high or too low.  please keep a record of the readings and bring it to your next appointment here (or you can bring the meter itself).  You can write it on any piece of paper.  please call us sooner if your blood sugar goes below 70, or if you have a lot of readings over 200. Please increase the insulin to 80 units with breakfast, and 10 units with supper.   On this type of insulin schedule, you should eat meals on a regular schedule.  If a meal is missed or significantly delayed, your blood sugar could go low.   Please come back for a follow-up appointment in 2 months.  Please see Vaughan Basta or Mickel Baas the same day, to review sick-day management.   Please continue to work with Dr Moshe Cipro, on your memory loss.

## 2019-04-01 NOTE — Progress Notes (Signed)
   Subjective:  78 y.o. female with PMHx of diabetes mellitus presenting today for follow up evaluation of an ulceration of the right ankle. She states she is doing well overall. She reports the unna wrap was too tight. She reports some clear blistering around the wound. She has been using Eucerin cream for treatment. Patient is here for further evaluation and treatment.   Past Medical History:  Diagnosis Date  . Alpha thalassemia trait 01/26/2010   02/2012: Nl CBC ex H&H-10.7/34.8, MCV-69   . Anemia   . Anxiety   . Anxiety and depression   . Cellulitis 05/09/2017  . Depression   . Diabetes mellitus   . GERD (gastroesophageal reflux disease)   . Headache(784.0)   . Hyperlipidemia   . Hypertension   . Iron deficiency 01/21/2017  . Microcytic anemia 01/26/2010   02/2012: Nl CBC ex H&H-10.7/34.8, MCV-69   . Obesity   . Obstructive sleep apnea   . Osteoarthritis    Left knee; right shoulder; chronic neck and back pain  . Pruritus   . Seizures ([place])   . Tremor    This started months ago after her seizure progressing to very poor hand writing  . Urinary incontinence   . UTI (urinary tract infection) 01/18/2013      Objective/Physical Exam General: The patient is alert and oriented x3 in no acute distress.  Dermatology:  Wound #1 noted to the right ankle measuring 2.5 x 0.6 x 0.2 cm (LxWxD).   To the noted ulceration(s), there is no eschar. There is a moderate amount of slough, fibrin, and necrotic tissue noted. Granulation tissue and wound base is red. There is a minimal amount of serosanguineous drainage noted. There is no exposed bone muscle-tendon ligament or joint. There is no malodor. Periwound integrity is intact. Skin is warm, dry and supple bilateral lower extremities.  Vascular: Palpable pedal pulses bilaterally. No edema or erythema noted. Capillary refill within normal limits.  Neurological: Epicritic and protective threshold diminished bilaterally.   Musculoskeletal Exam:  Range of motion within normal limits to all pedal and ankle joints bilateral. Muscle strength 5/5 in all groups bilateral.   Assessment: 1. Ulceration of the right ankle secondary to diabetes mellitus 2. diabetes mellitus w/ peripheral neuropathy 3. Venous insufficiency BLE   Plan of Care:  1. Patient was evaluated. 2. medically necessary excisional debridement including subcutaneous tissue was performed using a tissue nipper and a chisel blade. Excisional debridement of all the necrotic nonviable tissue down to healthy bleeding viable tissue was performed with post-debridement measurements same as pre-. 3. the wound was cleansed and dry sterile dressing applied. 4. Unna boot multilayer compression wrap applied. Keep clean, dry and intact for one week.  5. Return to clinic in two weeks.    Edrick Kins, DPM [facility]  Dr. Edrick Kins, [address]                                        [place], [place] [address]                Office [phone]  Fax [phone]

## 2019-04-03 ENCOUNTER — Ambulatory Visit (INDEPENDENT_AMBULATORY_CARE_PROVIDER_SITE_OTHER): Payer: Medicare HMO | Admitting: Family

## 2019-04-03 ENCOUNTER — Other Ambulatory Visit: Payer: Self-pay

## 2019-04-03 ENCOUNTER — Encounter: Payer: Self-pay | Admitting: Family

## 2019-04-03 VITALS — BP 125/59 | HR 71 | Temp 98.7°F | Resp 16 | Ht 68.0 in | Wt 274.0 lb

## 2019-04-03 DIAGNOSIS — T1490XD Injury, unspecified, subsequent encounter: Secondary | ICD-10-CM

## 2019-04-03 DIAGNOSIS — L299 Pruritus, unspecified: Secondary | ICD-10-CM | POA: Diagnosis not present

## 2019-04-03 NOTE — Progress Notes (Signed)
CC: Follow up healing wound of right ankle, has been evaluated for PAD and venous reflux  History of Present Illness  Laura Atkins is a 78 y.o. (1941-05-15) female whom Dr. Carlis Abbott initially evaluated on 03-10-19 for decreased pedal pulses as well as possible venous insufficiency in the setting of a right leg wound.  She had venous reflux duplex on 03-26-19, no venous reflux found.    She has multiple medical problems including obesity, diabetes, hypertension, hyperlipidemia.  Her wound had  been present for about a week after she scraped her leg. The wound is on the right anterior shin just above the ankle. She denies any previous lower extremity revascularizations or venous interventions. No significant rest pain otherwise limiting claudication, but she has difficulty and ambulates with a cane.  At her 03-10-19 visit, Dr. Carlis Abbott indicates that he did not think she has significant arterial disease that warrants intervention. She had bounding dorsalis pedis pulses that were palpable in both feet with triphasic waveforms at the ankles and toe pressures over 120 whichshould be more than adequate for wound healing. His suspicion wouldbe that this is more consistent with venous changesif anything. As aresult,he recommended lower extremityvenous reflux study and haveher follow-up. She would like to get her studies and be seen the same day, which NP clinic would be fine. Certainly if she had significant superficial reflux that we consider pathologicwe could arrange an Unna boot in wound clinic and ultimate evaluate for venous intervention. Dr. Carlis Abbott also discussed with her that given her wound has only been present for a week she may find that this heals fairly quickly given what appears to be adequate arterial inflow especiallyifshe does not have significant venous disease that would impair healing.  She has an unna boot dressing at her right lower leg and foot which had been applied  at her visit on 03-30-19 at Dr. Amalia Hailey office. She denies fever or chills.    Past Medical History:  Diagnosis Date   Alpha thalassemia trait 01/26/2010   02/2012: Nl CBC ex H&H-10.7/34.8, MCV-69    Anemia    Anxiety    Anxiety and depression    Cellulitis 05/09/2017   Depression    Diabetes mellitus    GERD (gastroesophageal reflux disease)    Headache(784.0)    Hyperlipidemia    Hypertension    Iron deficiency 01/21/2017   Microcytic anemia 01/26/2010   02/2012: Nl CBC ex H&H-10.7/34.8, MCV-69    Obesity    Obstructive sleep apnea    Osteoarthritis    Left knee; right shoulder; chronic neck and back pain   Pruritus    Seizures ([facility])    Tremor    This started months ago after her seizure progressing to very poor hand writing   Urinary incontinence    UTI (urinary tract infection) 01/18/2013    Social History Social History   Tobacco Use   Smoking status: Never Smoker   Smokeless tobacco: Never Used  Substance Use Topics   Alcohol use: No    Alcohol/week: 0.0 standard drinks   Drug use: No    Family History Family History  Problem Relation Age of Onset   Lung cancer Mother 17   Kidney disease Father    Diabetes Sister    Keloids Brother    ADD / ADHD Grandchild    Bipolar disorder Grandchild    Bipolar disorder Daughter    Seizures Daughter    Heart disease Daughter    Kidney disease Son  Neuropathy Son    Kidney disease Son    Edema Daughter    Breast cancer Daughter 61   Allergies Daughter    Alcohol abuse Neg Hx    Drug abuse Neg Hx     Surgical History Past Surgical History:  Procedure Laterality Date   ABDOMINAL HYSTERECTOMY     BREAST EXCISIONAL BIOPSY     Left; cyst   CATARACT EXTRACTION Right    12/2017   CATARACT EXTRACTION W/ INTRAOCULAR LENS IMPLANT Left 09/07/2013   CHOLECYSTECTOMY     COLONOSCOPY     COLONOSCOPY N/A 07/20/2015   Procedure: COLONOSCOPY;  Surgeon: Rogene Houston, MD;   Location: [facility];  Service: Endoscopy;  Laterality: N/A;  930   EYE SURGERY Left 09/07/2013   cataract    Allergies  Allergen Reactions   Penicillins Shortness Of Breath, Itching and Rash   Prednisone Shortness Of Breath, Itching and Rash   Propoxyphene N-Acetaminophen Itching and Nausea And Vomiting    Current Outpatient Medications  Medication Sig Dispense Refill   Accu-Chek FastClix Lancets MISC 1 each by Does not apply route 4 (four) times daily. USE TO TEST BLOOD SUGAR FOUR TIMES DAILY; E11.65 102 each 2   acetaminophen (EXTRA STRENGTH PAIN RELIEF) 500 MG tablet Take 500 mg by mouth every 6 (six) hours as needed for pain.     amLODipine (NORVASC) 10 MG tablet TAKE 1 TABLET BY MOUTH EVERY DAY 30 tablet 2   aspirin 81 MG tablet Take 81 mg by mouth daily.       atorvastatin (LIPITOR) 40 MG tablet TAKE 1 TABLET BY MOUTH EVERY DAY 30 tablet 2   benazepril (LOTENSIN) 20 MG tablet TAKE 1 TABLET BY MOUTH EVERY DAY 30 tablet 2   buPROPion (WELLBUTRIN XL) 150 MG 24 hr tablet Take 1 tablet (150 mg total) by mouth every morning. 30 tablet 2   Continuous Blood Gluc Sensor (FREESTYLE LIBRE 14 DAY SENSOR) MISC 1 Device by Does not apply route every 14 (fourteen) days. 6 each 3   diclofenac (VOLTAREN) 75 MG EC tablet Take 1 tablet (75 mg total) by mouth 2 (two) times daily. 30 tablet 0   diphenhydrAMINE (BENADRYL) 25 mg capsule Take 25 mg by mouth at bedtime. Reported on 10/18/2015     gabapentin (NEURONTIN) 400 MG capsule TAKE ONE CAPSULE BY MOUTH EVERY MORNING and TAKE ONE CAPSULE AT NOON and TAKE TWO CAPSULES AT BEDTIME 360 capsule 1   glucose blood (ACCU-CHEK GUIDE) test strip USE TO TEST BLOOD SUGAR FOUR TIMES DAILY; E11.65 100 each 12   ibuprofen (ADVIL,MOTRIN) 800 MG tablet      insulin NPH-regular Human (NOVOLIN 70/30) (70-30) 100 UNIT/ML injection 80 units with breakfast, and 10 units with supper, and syringes 2/day. 30 mL 11   Insulin Pen Needle 31G X 8 MM MISC  Used to give daily insulin injections. 100 each prn   Multiple Vitamin (MULTIVITAMIN) capsule Take 1 capsule by mouth daily.     mupirocin ointment (BACTROBAN) 2 % Apply to right heel once daily 22 g 1   omeprazole (PRILOSEC) 20 MG capsule TAKE ONE CAPSULE BY MOUTH EVERY DAY 30 capsule 2   potassium chloride SA (K-DUR,KLOR-CON) 20 MEQ tablet TAKE 1 TABLET BY MOUTH TWICE DAILY 180 tablet 1   sertraline (ZOLOFT) 100 MG tablet Take 1 tablet (100 mg total) by mouth daily. 60 tablet 5   torsemide (DEMADEX) 20 MG tablet TAKE TWO TABLETS BY MOUTH EVERY DAY (STOP lasix) 180 tablet  3   traMADol (ULTRAM) 50 MG tablet Take 1 tablet (50 mg total) by mouth every 8 (eight) hours as needed. 30 tablet 0   UNABLE TO FIND Walker x 1  DX unsteady gait, osteoarthritis of left knee 1 each 0   UNABLE TO FIND Elevated [place] Seat x 1 DX: unsteady gait, back pain, osteoarthritis of left knee 1 each 0   No current facility-administered medications for this visit.     REVIEW OF SYSTEMS: see HPI for pertinent positives and negatives   Physical Examination  Vitals:   04/03/19 1201  BP: (!) 125/59  Pulse: 71  Resp: 16  Temp: 98.7 F (37.1 C)  TempSrc: Temporal  SpO2: 97%  Weight: 274 lb (124.3 kg)  Height: 5\' 8"  (1.727 m)   Body mass index is 41.66 kg/m.  General: Morbidly obese female appears her stated age.  She ambulates with a walker.  HEENT:  No gross abnormalities Pulmonary: Respirations are non-labored Abdomen: Soft and non-tender  Musculoskeletal: There are no major deformities.   Neurologic: No focal weakness or paresthesias are detected. Skin: unna boot in place right lower leg and foot. Mild erythema anterior aspect of left lower leg, no skin changes to suggest cellulitis. Trace non pitting edema left lower leg.  Psychiatric: The patient has normal affect. Loquacious. Rambling conversation.  Cardiovascular: There is a regular rate and rhythm without significant murmur appreciated.     Vascular: Vessel Right Left  Aorta Not palpable N/A  Femoral Not Palpable seated, morbidly obese Not Palpable  Popliteal Not palpable Not palpable  PT Not Palpable Not Palpable  DP 2+Palpable Not Palpable    Medical Decision Making  EMMILIA SOWDER is a 78 y.o. female who presents with: healing wound at the anterior aspect of her right ankle, now managed by Dr. Amalia Hailey, podiatrist.   Mild erythema anterior aspect of left lower leg, no skin changes to suggest cellulitis. Trace non pitting edema left lower leg. No venous reflux in her lower extremities as noted on duplex done 03-26-19. Pt was measured for knee high compression hose, and given a pair of 20-30 mm Hg pressure; hose applied to left lower leg.   Palpable right DP pulse at her visit on 03-26-19, unna boot in place now, was applied by Dr. Rebekah [place] office at her visit there on 03-30-19.  Her wound started as a severely pruritic dark spot at the dorsal aspect of her right ankle/lower leg. She had this same pruritic wound in the same area on two other occassions in the last few years, was treated at [facility] ED. All 3 times the severe pruritus started as a dark spot. She also had tingling then pruritus in all fingers of both hands.   She has an appointment with Dr. Nevada Crane, dermatology, per pt. I referred her to dermatology at her last visit to address recurrent pruritic lesion right foot and tingling in her fingers. She states she has seen Dr. Nevada Crane in [place] to help her with this.   Dr. Amalia Hailey, podiatry, is treating her healing wound at the anterior aspect of her right ankle with an unna boot which she has on now; she was seen by him on 03-30-19. Pt has an appointment with his office on 04-13-19.   She is seeing a mental health specialist.   Follow up with [place] as needed.  Clemon Chambers, RN, MSN, FNP-C [facility] Office: [phone]  04/03/2019, 12:32 PM  Clinic MD: Donzetta Matters

## 2019-04-06 ENCOUNTER — Other Ambulatory Visit: Payer: Self-pay | Admitting: Family Medicine

## 2019-04-13 ENCOUNTER — Other Ambulatory Visit: Payer: Self-pay

## 2019-04-13 ENCOUNTER — Ambulatory Visit (INDEPENDENT_AMBULATORY_CARE_PROVIDER_SITE_OTHER): Payer: Medicare HMO | Admitting: Podiatry

## 2019-04-13 VITALS — Temp 97.2°F

## 2019-04-13 DIAGNOSIS — L97312 Non-pressure chronic ulcer of right ankle with fat layer exposed: Secondary | ICD-10-CM

## 2019-04-13 DIAGNOSIS — E0843 Diabetes mellitus due to underlying condition with diabetic autonomic (poly)neuropathy: Secondary | ICD-10-CM

## 2019-04-13 DIAGNOSIS — L97412 Non-pressure chronic ulcer of right heel and midfoot with fat layer exposed: Secondary | ICD-10-CM

## 2019-04-13 DIAGNOSIS — I872 Venous insufficiency (chronic) (peripheral): Secondary | ICD-10-CM

## 2019-04-13 MED ORDER — GENTAMICIN SULFATE 0.1 % EX CREA: 1.0000 "application " | TOPICAL_CREAM | Freq: Two times a day (BID) | CUTANEOUS | 1 refills | Status: DC

## 2019-04-14 ENCOUNTER — Other Ambulatory Visit (HOSPITAL_COMMUNITY): Payer: Self-pay | Admitting: Psychiatry

## 2019-04-15 NOTE — Progress Notes (Signed)
Subjective:  78 y.o. female with PMHx of diabetes mellitus presenting today for follow up evaluation of an ulceration of the right ankle. She states she is doing well overall. She reports a scabbing lesion to the right plantar heel that appeared about one week ago. She has been using the Gentamicin cream as directed. There are no worsening factors noted. Patient is here for further evaluation and treatment.   Past Medical History:  Diagnosis Date  . Alpha thalassemia trait 01/26/2010   02/2012: Nl CBC ex H&H-10.7/34.8, MCV-69   . Anemia   . Anxiety   . Anxiety and depression   . Cellulitis 05/09/2017  . Depression   . Diabetes mellitus   . GERD (gastroesophageal reflux disease)   . Headache(784.0)   . Hyperlipidemia   . Hypertension   . Iron deficiency 01/21/2017  . Microcytic anemia 01/26/2010   02/2012: Nl CBC ex H&H-10.7/34.8, MCV-69   . Obesity   . Obstructive sleep apnea   . Osteoarthritis    Left knee; right shoulder; chronic neck and back pain  . Pruritus   . Seizures ([place])   . Tremor    This started months ago after her seizure progressing to very poor hand writing  . Urinary incontinence   . UTI (urinary tract infection) 01/18/2013      Objective/Physical Exam General: The patient is alert and oriented x3 in no acute distress.  Dermatology:  Wound #1 noted to the right ankle measuring 2.5 x 1.5 x 0.1 cm (LxWxD).   Wound #2 noted to the right heel measuring 1.0 x 1.0 x 0.1 cm.   To the noted ulceration(s), there is no eschar. There is a moderate amount of slough, fibrin, and necrotic tissue noted. Granulation tissue and wound base is red. There is a minimal amount of serosanguineous drainage noted. There is no exposed bone muscle-tendon ligament or joint. There is no malodor. Periwound integrity is intact. Skin is warm, dry and supple bilateral lower extremities.  Vascular: Palpable pedal pulses bilaterally. No edema or erythema noted. Capillary refill within normal  limits.  Neurological: Epicritic and protective threshold diminished bilaterally.   Musculoskeletal Exam: Range of motion within normal limits to all pedal and ankle joints bilateral. Muscle strength 5/5 in all groups bilateral.   Assessment: 1. Ulceration of the right ankle secondary to diabetes mellitus 2. Ulceration of the right heel secondary to diabetes mellitus  3. diabetes mellitus w/ peripheral neuropathy 4. Venous insufficiency BLE   Plan of Care:  1. Patient was evaluated. 2. medically necessary excisional debridement including subcutaneous tissue was performed using a tissue nipper and a chisel blade. Excisional debridement of all the necrotic nonviable tissue down to healthy bleeding viable tissue was performed with post-debridement measurements same as pre-. 3. the wound was cleansed and dry sterile dressing applied. 4. Prescription for Gentamicin cream provided to patient.  5. Continue using below knee compression hose.  6. Return to clinic in 3 weeks.     Edrick Kins, DPM [facility]  Dr. Edrick Kins, [address]                                        [place], [place] [address]                Office [phone]  Fax [phone]

## 2019-04-16 DIAGNOSIS — I872 Venous insufficiency (chronic) (peripheral): Secondary | ICD-10-CM | POA: Diagnosis not present

## 2019-04-23 ENCOUNTER — Other Ambulatory Visit: Payer: Self-pay

## 2019-04-23 ENCOUNTER — Encounter: Payer: Self-pay | Admitting: Family Medicine

## 2019-04-23 ENCOUNTER — Ambulatory Visit (INDEPENDENT_AMBULATORY_CARE_PROVIDER_SITE_OTHER): Payer: Medicare HMO | Admitting: Family Medicine

## 2019-04-23 VITALS — BP 129/61 | HR 68 | Temp 98.0°F | Resp 12 | Ht 68.0 in | Wt 273.1 lb

## 2019-04-23 DIAGNOSIS — E0843 Diabetes mellitus due to underlying condition with diabetic autonomic (poly)neuropathy: Secondary | ICD-10-CM | POA: Diagnosis not present

## 2019-04-23 DIAGNOSIS — L97312 Non-pressure chronic ulcer of right ankle with fat layer exposed: Secondary | ICD-10-CM | POA: Diagnosis not present

## 2019-04-23 DIAGNOSIS — I872 Venous insufficiency (chronic) (peripheral): Secondary | ICD-10-CM

## 2019-04-23 DIAGNOSIS — F419 Anxiety disorder, unspecified: Secondary | ICD-10-CM

## 2019-04-23 DIAGNOSIS — F329 Major depressive disorder, single episode, unspecified: Secondary | ICD-10-CM | POA: Diagnosis not present

## 2019-04-23 DIAGNOSIS — I10 Essential (primary) hypertension: Secondary | ICD-10-CM

## 2019-04-23 DIAGNOSIS — E1143 Type 2 diabetes mellitus with diabetic autonomic (poly)neuropathy: Secondary | ICD-10-CM

## 2019-04-23 DIAGNOSIS — F32A Depression, unspecified: Secondary | ICD-10-CM

## 2019-04-23 NOTE — Progress Notes (Signed)
Subjective:     Patient ID: Laura Atkins, female   DOB: 06/04/1941, 78 y.o.   MRN: [id number]  LARIE MATHES presents for Depression (follow up) and Hypertension  Blood pressure is stable today. Denies vision changes, HA, or dizziness. Reports taking medication as directed.  Depression: Has not talked or see Dr Harrington Challenger or Ava, therapist since May. Report she liked them and enjoyed her conversation with Ava, but just as much enjoys coming to see me and talking with me as well. She is taking her medication as directed. Her meds are bubble packed so this helps with adherence. Her mood seems improved from last visit. She still talks about daughter worrying her a lot. She also talks about retail therapy making her feel better.  Today patient denies signs and symptoms of COVID 19 infection including fever, chills, cough, shortness of breath, and headache.  Past Medical, Surgical, Social History, Allergies, and Medications have been Reviewed.   Past Medical History:  Diagnosis Date  . Alpha thalassemia trait 01/26/2010   02/2012: Nl CBC ex H&H-10.7/34.8, MCV-69   . Anemia   . Anxiety   . Anxiety and depression   . Cellulitis 05/09/2017  . Depression   . Diabetes mellitus   . GERD (gastroesophageal reflux disease)   . Headache(784.0)   . Hyperlipidemia   . Hypertension   . Iron deficiency 01/21/2017  . Microcytic anemia 01/26/2010   02/2012: Nl CBC ex H&H-10.7/34.8, MCV-69   . Obesity   . Obstructive sleep apnea   . Osteoarthritis    Left knee; right shoulder; chronic neck and back pain  . Pruritus   . Seizures ([place])   . Tremor    This started months ago after her seizure progressing to very poor hand writing  . Urinary incontinence   . UTI (urinary tract infection) 01/18/2013   Past Surgical History:  Procedure Laterality Date  . ABDOMINAL HYSTERECTOMY    . BREAST EXCISIONAL BIOPSY     Left; cyst  . CATARACT EXTRACTION Right    12/2017  . CATARACT EXTRACTION W/ INTRAOCULAR  LENS IMPLANT Left 09/07/2013  . CHOLECYSTECTOMY    . COLONOSCOPY    . COLONOSCOPY N/A 07/20/2015   Procedure: COLONOSCOPY;  Surgeon: Rogene Houston, MD;  [facility];  Service: Endoscopy;  Laterality: N/A;  930  . EYE SURGERY Left 09/07/2013   cataract   Social History   Socioeconomic History  . Marital status: Married    Spouse name: saunders  . Number of children: 5  . Years of education: 62  . Highest education level: Not on file  Occupational History  . Occupation: Disabled     Employer: RETIRED  Social Needs  . Financial resource strain: Not hard at all  . Food insecurity    Worry: Never true    Inability: Never true  . Transportation needs    Medical: No    Non-medical: No  Tobacco Use  . Smoking status: Never Smoker  . Smokeless tobacco: Never Used  Substance and Sexual Activity  . Alcohol use: No    Alcohol/week: 0.0 standard drinks  . Drug use: No  . Sexual activity: Yes    Birth control/protection: Surgical  Lifestyle  . Physical activity    Days per week: 0 days    Minutes per session: 0 min  . Stress: Only a little  Relationships  . Social connections    Talks on phone: Twice a week    Gets  together: Twice a week    Attends religious service: More than 4 times per year    Active member of club or organization: Yes    Attends meetings of clubs or organizations: More than 4 times per year    Relationship status: Married  . Intimate partner violence    Fear of current or ex partner: No    Emotionally abused: No    Physically abused: No    Forced sexual activity: No  Other Topics Concern  . Not on file  Social History Narrative   Patient lives at home with her husband [name].  Patient is retired.    Right handed.    Five Children.    Caffeine- 2 daily    Outpatient Encounter Medications as of 04/23/2019  Medication Sig  . Accu-Chek FastClix Lancets MISC 1 each by Does not apply route 4 (four) times daily. USE TO TEST BLOOD SUGAR  FOUR TIMES DAILY; E11.65  . acetaminophen (EXTRA STRENGTH PAIN RELIEF) 500 MG tablet Take 500 mg by mouth every 6 (six) hours as needed for pain.  Marland Kitchen amLODipine (NORVASC) 10 MG tablet TAKE 1 TABLET BY MOUTH EVERY DAY  . aspirin 81 MG tablet Take 81 mg by mouth daily.    Marland Kitchen atorvastatin (LIPITOR) 40 MG tablet TAKE 1 TABLET BY MOUTH EVERY DAY  . benazepril (LOTENSIN) 20 MG tablet TAKE 1 TABLET BY MOUTH EVERY DAY  . buPROPion (WELLBUTRIN XL) 150 MG 24 hr tablet Take 1 tablet (150 mg total) by mouth every morning.  . Continuous Blood Gluc Sensor (FREESTYLE LIBRE 14 DAY SENSOR) MISC 1 Device by Does not apply route every 14 (fourteen) days.  . diclofenac (VOLTAREN) 75 MG EC tablet Take 1 tablet (75 mg total) by mouth 2 (two) times daily.  . diphenhydrAMINE (BENADRYL) 25 mg capsule Take 25 mg by mouth at bedtime. Reported on 10/18/2015  . gabapentin (NEURONTIN) 400 MG capsule TAKE ONE CAPSULE BY MOUTH EVERY MORNING and TAKE ONE CAPSULE AT NOON and TAKE TWO CAPSULES AT BEDTIME  . gentamicin cream (GARAMYCIN) 0.1 % Apply 1 application topically 2 (two) times daily.  Marland Kitchen glucose blood (ACCU-CHEK GUIDE) test strip USE TO TEST BLOOD SUGAR FOUR TIMES DAILY; E11.65  . insulin NPH-regular Human (NOVOLIN 70/30) (70-30) 100 UNIT/ML injection 80 units with breakfast, and 10 units with supper, and syringes 2/day.  . Insulin Pen Needle 31G X 8 MM MISC Used to give daily insulin injections.  . Multiple Vitamin (MULTIVITAMIN) capsule Take 1 capsule by mouth daily.  . mupirocin ointment (BACTROBAN) 2 % Apply to right heel once daily  . omeprazole (PRILOSEC) 20 MG capsule TAKE ONE CAPSULE BY MOUTH EVERY DAY  . potassium chloride SA (K-DUR,KLOR-CON) 20 MEQ tablet TAKE 1 TABLET BY MOUTH TWICE DAILY  . sertraline (ZOLOFT) 100 MG tablet Take 1 tablet (100 mg total) by mouth daily.  Marland Kitchen torsemide (DEMADEX) 20 MG tablet TAKE TWO TABLETS BY MOUTH EVERY DAY (STOP lasix)  . traMADol (ULTRAM) 50 MG tablet Take 1 tablet (50 mg total)  by mouth every 8 (eight) hours as needed.  Marland Kitchen UNABLE TO FIND Walker x 1  DX unsteady gait, osteoarthritis of left knee  . UNABLE TO FIND Elevated [place] Seat x 1 DX: unsteady gait, back pain, osteoarthritis of left knee  . [DISCONTINUED] ibuprofen (ADVIL,MOTRIN) 800 MG tablet    No facility-administered encounter medications on file as of 04/23/2019.    Allergies  Allergen Reactions  . Penicillins Shortness Of Breath, Itching and Rash  .  Prednisone Shortness Of Breath, Itching and Rash  . Propoxyphene N-Acetaminophen Itching and Nausea And Vomiting    Review of Systems  Constitutional: Negative for appetite change, chills and fever.  HENT: Negative.   Eyes: Negative.   Respiratory: Negative.  Negative for cough and shortness of breath.   Cardiovascular: Negative.   Gastrointestinal: Negative.   Endocrine: Negative.   Genitourinary: Negative.   Musculoskeletal: Negative.   Skin: Negative.   Allergic/Immunologic: Negative.   Neurological: Negative.   Hematological: Negative.   Psychiatric/Behavioral: Negative.   All other systems reviewed and are negative.      Objective:     BP 129/61   Pulse 68   Temp 98 F (36.7 C) (Oral)   Resp 12   Ht 5\' 8"  (1.727 m)   Wt 273 lb 1.3 oz (123.9 kg)   SpO2 97%   BMI 41.52 kg/m   Physical Exam Vitals signs and nursing note reviewed.  Constitutional:      Appearance: Normal appearance. She is well-developed and well-groomed. She is obese.  HENT:     Head: Normocephalic and atraumatic.     Right Ear: External ear normal.     Left Ear: External ear normal.     Nose: Nose normal.     Mouth/Throat:     Mouth: Mucous membranes are moist.     Pharynx: Oropharynx is clear.  Eyes:     General:        Right eye: No discharge.        Left eye: No discharge.     Conjunctiva/sclera: Conjunctivae normal.  Neck:     Musculoskeletal: Normal range of motion and neck supple.  Cardiovascular:     Rate and Rhythm: Normal rate and regular  rhythm.     Pulses: Normal pulses.     Heart sounds: Normal heart sounds.  Pulmonary:     Effort: Pulmonary effort is normal.     Breath sounds: Normal breath sounds.  Musculoskeletal: Normal range of motion.  Skin:    General: Skin is warm.  Neurological:     General: No focal deficit present.     Mental Status: She is alert and oriented to person, place, and time.  Psychiatric:        Attention and Perception: Attention normal.        Mood and Affect: Mood normal.        Speech: Speech normal.        Behavior: Behavior normal. Behavior is cooperative.        Thought Content: Thought content normal.        Cognition and Memory: Cognition normal.        Judgment: Judgment normal.        Assessment and Plan             1. Diabetes mellitus due to underlying condition with diabetic autonomic neuropathy, without long-term current use of insulin ([place]) Is followed by Dr Loanne Drilling, wants to use a Elenor Legato new form for checking sugar. A1c had increased last check.  TYKESHIA TOURANGEAU is encouraged to check blood sugar daily as directed. Continue current medications. Is on statin as well. Educated on importance of maintain a well balanced diabetic friendly diet.  She is reminded the importance of maintaining  good blood sugars,  taking medications as directed, daily foot care, annual eye exams. Additionally educated about keeping good control over blood pressure and cholesterol as well. Advised to continue nutrition follow ups.  2. Essential  hypertension Controlled, continue current medication regimen.   No refills needed.   3. Anxiety and depression On going anxiety and depression. Mood has improved. She mainly wants to have someone to "vent" her  Worries and stressed too. She enjoys talking about random things. But overall just giving a few minutes of time for this helps her a lot. Will continue medication at this time.  Advised to follow up with Dr Harrington Challenger or Ava, but reports she  enjoys talking  With me. Will encourage therapy at next visit.   4. Peripheral venous insufficiency Being followed by [facility], compression hose provided. Advised to follow their tx plans.  5. Non-pressure chronic ulcer of right ankle with fat layer exposed ([place]) Is seeing foot and ankle specialist for wound care. Advised to follow their recommendations. I appreciate collaboration in patient's plan of care. Please let PCP know if assistance from [place] is needed.  Follow Up: 3 months    Perlie Mayo, DNP, AGNP-BC [place], [address] [place], [place] [address] Office Hours: Mon-Thurs 8 am-5 pm; Fri 8 am-12 pm Office Phone:  [phone]  Office Fax: [phone]

## 2019-04-23 NOTE — Patient Instructions (Addendum)
    Thank you for coming into the office today. I appreciate the opportunity to provide you with the care for your health and wellness. Today we discussed: overall health  Follow Up: 3 months or as needed  No labs or referrals today.   So happy you seem to be in a better mood this visit.  Continue the positive outlook.  Remember don't weeds other peoples gardens.  Continue treatment for ulcer of leg.  Continue to take all medications as directed.   Continue to wear compression hose as directed.  Please continue to practice social distancing to keep you, your family, and our community safe.  If you must go out, please wear a Mask and practice good handwashing.  [place] YOUR HANDS WELL AND FREQUENTLY. AVOID TOUCHING YOUR FACE, UNLESS YOUR HANDS ARE FRESHLY WASHED.  GET FRESH AIR DAILY. STAY HYDRATED WITH WATER.   It was a pleasure to see you and I look forward to continuing to work together on your health and well-being. Please do not hesitate to call the office if you need care or have questions about your care.  Have a wonderful day and week.  With Gratitude,  Cherly Beach, DNP, AGNP-BC

## 2019-04-29 ENCOUNTER — Encounter: Payer: Self-pay | Admitting: Family Medicine

## 2019-04-29 ENCOUNTER — Ambulatory Visit (INDEPENDENT_AMBULATORY_CARE_PROVIDER_SITE_OTHER): Payer: Medicare HMO | Admitting: Family Medicine

## 2019-04-29 ENCOUNTER — Other Ambulatory Visit: Payer: Self-pay

## 2019-04-29 DIAGNOSIS — J029 Acute pharyngitis, unspecified: Secondary | ICD-10-CM | POA: Diagnosis not present

## 2019-04-29 DIAGNOSIS — R159 Full incontinence of feces: Secondary | ICD-10-CM | POA: Diagnosis not present

## 2019-04-29 NOTE — Patient Instructions (Signed)
    Thank you for coming into the office today. I appreciate the opportunity to provide you with the care for your health and wellness. Today we discussed: feeling stuffy/ear discomfort/loose stool  Follow Up: scheduled for 07/23/2019  No labs today.  So glad you started feeling better. Perhaps it was something you ate that cause the tummy issues.  Stay hydrated. If you continue to have loose stools let me know. Please eat plenty of fresh fruits and veggies.  Please continue to practice social distancing to keep you, your family, and our community safe.  If you must go out, please wear a Mask and practice good handwashing.  [place] YOUR HANDS WELL AND FREQUENTLY. AVOID TOUCHING YOUR FACE, UNLESS YOUR HANDS ARE FRESHLY WASHED.  GET FRESH AIR DAILY. STAY HYDRATED WITH WATER.   It was a pleasure to see you and I look forward to continuing to work together on your health and well-being. Please do not hesitate to call the office if you need care or have questions about your care.  Have a wonderful day and week. With Gratitude, Cherly Beach, DNP, AGNP-BC

## 2019-04-29 NOTE — Progress Notes (Signed)
Virtual Visit via Telephone Note   This visit type was conducted due to national recommendations for restrictions regarding the COVID-19 Pandemic (e.g. social distancing) in an effort to limit this patient's exposure and mitigate transmission in our community.  Due to her co-morbid illnesses, this patient is at least at moderate risk for complications without adequate follow up.  This format is felt to be most appropriate for this patient at this time.  The patient did not have access to video technology/had technical difficulties with video requiring transitioning to audio format only (telephone).  All issues noted in this document were discussed and addressed.  No physical exam could be performed with this format.    Evaluation Performed:  Follow-up visit  Date:  04/29/2019   ID:  Angell, Pincock 1941-05-29, MRN [id number]  Patient Location: Home Provider Location: Office  Location of Patient: Home Location of Provider: Telehealth Consent was obtain for visit to be over via telehealth. I verified that I am speaking with the correct person using two identifiers.  PCP:  Fayrene Helper, MD   Chief Complaint:  Felt stuffy, but better now  History of Present Illness:    Laura Atkins is a 78 y.o. female with with reports of feeling stuffy, with right ear discomfort in her head hurting.  These occurred over the early part of the week but have since stopped.  She reports "cold left her body when she had an accident on herself before getting home today" accident is described as having a bowel movement before she can get into the house.  Reports bowel movement was soft last loose but not watery.  Reports that her belly feels okay she did not have any nausea vomiting she had no more accidents during the day no more stools to report.  Denied having any blood in stool.  Denies having sore throat now.  Denies have any stuffiness, headaches, right ear pain.  Denies having cough,  shortness of breath, fever, chills.  Reports that she does not want to have the "C-19" feels like she is doing much better right now and does not need to be given any medications.  Reports taking medications as directed.  The patient does not have symptoms concerning for COVID-19 infection (fever, chills, cough, or new shortness of breath).   Past Medical, Surgical, Social History, Allergies, and Medications have been Reviewed.   Past Medical History:  Diagnosis Date  . Alpha thalassemia trait 01/26/2010   02/2012: Nl CBC ex H&H-10.7/34.8, MCV-69   . Anemia   . Anxiety   . Anxiety and depression   . Cellulitis 05/09/2017  . Depression   . Diabetes mellitus   . GERD (gastroesophageal reflux disease)   . Headache(784.0)   . Hyperlipidemia   . Hypertension   . Iron deficiency 01/21/2017  . Microcytic anemia 01/26/2010   02/2012: Nl CBC ex H&H-10.7/34.8, MCV-69   . Obesity   . Obstructive sleep apnea   . Osteoarthritis    Left knee; right shoulder; chronic neck and back pain  . Pruritus   . Seizures ([place])   . Tremor    This started months ago after her seizure progressing to very poor hand writing  . Urinary incontinence   . UTI (urinary tract infection) 01/18/2013   Past Surgical History:  Procedure Laterality Date  . ABDOMINAL HYSTERECTOMY    . BREAST EXCISIONAL BIOPSY     Left; cyst  . CATARACT EXTRACTION Right  12/2017  . CATARACT EXTRACTION W/ INTRAOCULAR LENS IMPLANT Left 09/07/2013  . CHOLECYSTECTOMY    . COLONOSCOPY    . COLONOSCOPY N/A 07/20/2015   Procedure: COLONOSCOPY;  Surgeon: Rogene Houston, MD;  [facility];  Service: Endoscopy;  Laterality: N/A;  930  . EYE SURGERY Left 09/07/2013   cataract     No outpatient medications have been marked as taking for the 04/29/19 encounter (Office Visit) with Perlie Mayo, NP.     Allergies:   Penicillins, Prednisone, and Propoxyphene n-acetaminophen   Social History   Tobacco Use  . Smoking status:  Never Smoker  . Smokeless tobacco: Never Used  Substance Use Topics  . Alcohol use: No    Alcohol/week: 0.0 standard drinks  . Drug use: No     Family Hx: The patient's family history includes ADD / ADHD in her grandchild; Allergies in her daughter; Bipolar disorder in her daughter and grandchild; Breast cancer (age of onset: 73) in her daughter; Diabetes in her sister; Edema in her daughter; Heart disease in her daughter; Keloids in her brother; Kidney disease in her father, son, and son; Lung cancer (age of onset: 44) in her mother; Neuropathy in her son; Seizures in her daughter. There is no history of Alcohol abuse or Drug abuse.  ROS:   Please see the history of present illness.     All other systems reviewed and are negative.   Labs/Other Tests and Data Reviewed:     Recent Labs: 11/26/2018: ALT 14; BUN 13; Creat 0.79; Hemoglobin 10.4; Platelets 231; Potassium 4.0; Sodium 141; TSH 1.12   Recent Lipid Panel Lab Results  Component Value Date/Time   CHOL 171 11/26/2018 09:45 AM   TRIG 118 11/26/2018 09:45 AM   HDL 60 11/26/2018 09:45 AM   CHOLHDL 2.9 11/26/2018 09:45 AM   LDLCALC 90 11/26/2018 09:45 AM    Wt Readings from Last 3 Encounters:  04/23/19 273 lb 1.3 oz (123.9 kg)  04/03/19 274 lb (124.3 kg)  03/31/19 273 lb 12.8 oz (124.2 kg)     Objective:    Vital Signs:  There were no vitals taken for this visit. telemed visit  GEN:  alert and oriented RESPIRATORY:  no shortness of breath in comversation PSYCH:  normal mood and affect, good communication  ASSESSMENT & PLAN:    1. Sore throat She reports the "cold left her body after she had the accident with stool" She reports feeling so much better now and does not have s&S of "c-19' Advised to hydrate and eat a veggies and fruits. Encouraged her to practice social distancing and good hand washing. Advised to call back if she starts to feel bad again. Patient acknowledged agreement and understanding of the plan.     2. Incontinence of feces, unspecified fecal incontinence type Not a regular occurrence, just reports not making it home in time. Denies blood or cramping or other accidents or stools today. Advised to keep hydrated and eat a well balanced diet. Patient acknowledged agreement and understanding of the plan.    Time:   Today, I have spent 10 minutes with the patient with telehealth technology discussing the above problems.     Medication Adjustments/Labs and Tests Ordered: Current medicines are reviewed at length with the patient today.  Concerns regarding medicines are outlined above.   Tests Ordered: No orders of the defined types were placed in this encounter.   Medication Changes: No orders of the defined types were placed in  this encounter.   Disposition:  Follow up prn  07/23/2019   Signed, Perlie Mayo, NP  04/29/2019 3:37 PM     [facility]

## 2019-04-30 ENCOUNTER — Ambulatory Visit (HOSPITAL_COMMUNITY): Payer: Medicare HMO | Admitting: Psychiatry

## 2019-04-30 ENCOUNTER — Other Ambulatory Visit: Payer: Self-pay

## 2019-05-04 ENCOUNTER — Other Ambulatory Visit: Payer: Self-pay | Admitting: Family Medicine

## 2019-05-11 ENCOUNTER — Encounter: Payer: Self-pay | Admitting: Podiatry

## 2019-05-11 ENCOUNTER — Other Ambulatory Visit: Payer: Self-pay

## 2019-05-11 ENCOUNTER — Ambulatory Visit (INDEPENDENT_AMBULATORY_CARE_PROVIDER_SITE_OTHER): Payer: Medicare HMO | Admitting: Podiatry

## 2019-05-11 VITALS — Temp 97.2°F

## 2019-05-11 DIAGNOSIS — I872 Venous insufficiency (chronic) (peripheral): Secondary | ICD-10-CM | POA: Diagnosis not present

## 2019-05-11 DIAGNOSIS — B351 Tinea unguium: Secondary | ICD-10-CM

## 2019-05-11 DIAGNOSIS — E0843 Diabetes mellitus due to underlying condition with diabetic autonomic (poly)neuropathy: Secondary | ICD-10-CM

## 2019-05-11 DIAGNOSIS — L97312 Non-pressure chronic ulcer of right ankle with fat layer exposed: Secondary | ICD-10-CM | POA: Diagnosis not present

## 2019-05-11 DIAGNOSIS — M79676 Pain in unspecified toe(s): Secondary | ICD-10-CM

## 2019-05-11 DIAGNOSIS — L97412 Non-pressure chronic ulcer of right heel and midfoot with fat layer exposed: Secondary | ICD-10-CM

## 2019-05-13 ENCOUNTER — Encounter: Payer: Self-pay | Admitting: Family Medicine

## 2019-05-13 ENCOUNTER — Ambulatory Visit (INDEPENDENT_AMBULATORY_CARE_PROVIDER_SITE_OTHER): Payer: Medicare HMO | Admitting: Family Medicine

## 2019-05-13 ENCOUNTER — Other Ambulatory Visit: Payer: Self-pay

## 2019-05-13 VITALS — BP 130/72 | HR 79 | Temp 97.5°F | Resp 15 | Ht 68.0 in | Wt 260.1 lb

## 2019-05-13 DIAGNOSIS — E1165 Type 2 diabetes mellitus with hyperglycemia: Secondary | ICD-10-CM | POA: Diagnosis not present

## 2019-05-13 DIAGNOSIS — E7849 Other hyperlipidemia: Secondary | ICD-10-CM

## 2019-05-13 DIAGNOSIS — N3000 Acute cystitis without hematuria: Secondary | ICD-10-CM

## 2019-05-13 DIAGNOSIS — Z794 Long term (current) use of insulin: Secondary | ICD-10-CM

## 2019-05-13 DIAGNOSIS — I10 Essential (primary) hypertension: Secondary | ICD-10-CM | POA: Diagnosis not present

## 2019-05-13 DIAGNOSIS — F419 Anxiety disorder, unspecified: Secondary | ICD-10-CM

## 2019-05-13 DIAGNOSIS — F329 Major depressive disorder, single episode, unspecified: Secondary | ICD-10-CM

## 2019-05-13 DIAGNOSIS — Z23 Encounter for immunization: Secondary | ICD-10-CM | POA: Diagnosis not present

## 2019-05-13 DIAGNOSIS — Z1231 Encounter for screening mammogram for malignant neoplasm of breast: Secondary | ICD-10-CM

## 2019-05-13 DIAGNOSIS — F32A Depression, unspecified: Secondary | ICD-10-CM

## 2019-05-13 LAB — POCT URINALYSIS DIP (CLINITEK)
Bilirubin, UA: NEGATIVE
Glucose, UA: NEGATIVE mg/dL
Ketones, POC UA: NEGATIVE mg/dL
Nitrite, UA: NEGATIVE
POC PROTEIN,UA: NEGATIVE
Spec Grav, UA: 1.01 (ref 1.010–1.025)
Urobilinogen, UA: 0.2 E.U./dL
pH, UA: 5.5 (ref 5.0–8.0)

## 2019-05-13 MED ORDER — CIPROFLOXACIN HCL 500 MG PO TABS: 500.0000 mg | ORAL_TABLET | Freq: Two times a day (BID) | ORAL | 0 refills | Status: DC

## 2019-05-13 NOTE — Progress Notes (Signed)
   Subjective:  78 y.o. female with PMHx of diabetes mellitus presenting today for follow up evaluation of ulcerations of the right ankle and heel. She states she is doing well and her wounds have healed appropriately. She has been using Gentamicin and compression hose as directed. There are no worsening factors indicated at this time. Patient is here for further evaluation and treatment.   Past Medical History:  Diagnosis Date  . Alpha thalassemia trait 01/26/2010   02/2012: Nl CBC ex H&H-10.7/34.8, MCV-69   . Anemia   . Anxiety   . Anxiety and depression   . Cellulitis 05/09/2017  . Depression   . Diabetes mellitus   . GERD (gastroesophageal reflux disease)   . Headache(784.0)   . Hyperlipidemia   . Hypertension   . Iron deficiency 01/21/2017  . Microcytic anemia 01/26/2010   02/2012: Nl CBC ex H&H-10.7/34.8, MCV-69   . Obesity   . Obstructive sleep apnea   . Osteoarthritis    Left knee; right shoulder; chronic neck and back pain  . Pruritus   . Seizures ([place])   . Tremor    This started months ago after her seizure progressing to very poor hand writing  . Urinary incontinence   . UTI (urinary tract infection) 01/18/2013      Objective/Physical Exam General: The patient is alert and oriented x3 in no acute distress.  Dermatology:  Wound noted to the right ankle has healed. Complete re-epithelialization has occurred. No drainage noted.  Wound noted to the right heel has healed. Complete re-epithelialization has occurred. No drainage noted.  Nails are tender, long, thickened and dystrophic with subungual debris, consistent with onychomycosis, 1-5 bilateral. No signs of infection noted. Skin is warm, dry and supple bilateral lower extremities.  Vascular: Palpable pedal pulses bilaterally. No edema or erythema noted. Capillary refill within normal limits.  Neurological: Epicritic and protective threshold diminished bilaterally.   Musculoskeletal Exam: Range of motion within normal  limits to all pedal and ankle joints bilateral. Muscle strength 5/5 in all groups bilateral.   Assessment: 1. Ulceration of the right ankle secondary to diabetes mellitus - healed  2. Ulceration of the right heel secondary to diabetes mellitus - healed 3. diabetes mellitus w/ peripheral neuropathy 4. Venous insufficiency BLE 5. Diabetes Mellitus w/ peripheral neuropathy 6. Onychomycosis of nail due to dermatophyte bilateral   Plan of Care:  1. Patient was evaluated. 2. Mechanical debridement of nails 1-5 bilaterally performed using a nail nipper. Filed with dremel without incident.  3. Recommended good shoe gear.  4. Continue using compression hose bilaterally.  5. Return to clinic in 3 months for routine nail care.     Edrick Kins, DPM [facility]  Dr. Edrick Kins, [address]                                        [place], [place] [address]                Office [phone]  Fax [phone]

## 2019-05-13 NOTE — Progress Notes (Signed)
Laura Atkins     MRN: [id number]      DOB: 09/23/1941   HPI [name] is here c/o suprapubic pressure , and mild frequency following intercourse last week, no fever, chills or flank pain,  She denies vaginal discharge Denies polyuria, polydipsia, blurred vision , or hypoglycemic episodes. Blood sugar uncontrolled however ROS Denies recent fever or chills. Denies sinus pressure, nasal congestion, ear pain or sore throat. Denies chest congestion, productive cough or wheezing. Denies chest pains, palpitations and leg swelling Denies abdominal pain, nausea, vomiting,diarrhea or constipation.   C/o  joint pain, swelling and limitation in mobility. Denies headaches, seizures, numbness, or tingling. Denies uncontrolled  depression, anxiety or insomnia. Denies skin break down or rash.   PE  BP 130/72   Pulse 79   Temp (!) 97.5 F (36.4 C) (Temporal)   Resp 15   Ht 5\' 8"  (1.727 m)   Wt 260 lb 1.9 oz (118 kg)   SpO2 96%   BMI 39.55 kg/m    Patient alert and oriented and in no cardiopulmonary distress.  HEENT: No facial asymmetry, EOMI,   oropharynx pink and moist.  Neck supple no JVD, no mass.  Chest: Clear to auscultation bilaterally.  CVS: S1, S2 no murmurs, no S3.Regular rate.  ABD: Soft non tender.   Ext: No edema  MS: decreased  ROM spine, shoulders, hips and knees.  Skin: Intact, no ulcerations or rash noted.  Psych: Good eye contact, normal affect. Memory intact not anxious or depressed appearing.  CNS: CN 2-12 intact, power,  normal throughout.no focal deficits noted.   Assessment & Plan  Acute cystitis without hematuria CCUA abnormal, 3 day cipro and send for c/s  Morbid obesity Obesity linked with hypertension and diabetes  Patient re-educated about  the importance of commitment to a  minimum of 150 minutes of exercise per week as able.  The importance of healthy food choices with portion control discussed, as well as eating regularly and within a  12 hour window most days. The need to choose "clean , green" food 50 to 75% of the time is discussed, as well as to make water the primary drink and set a goal of 64 ounces water daily.    Weight /BMI 05/13/2019 04/23/2019 04/03/2019  WEIGHT 260 lb 1.9 oz 273 lb 1.3 oz 274 lb  HEIGHT 5\' 8"  5\' 8"  5\' 8"   BMI 39.55 kg/m2 41.52 kg/m2 41.66 kg/m2  Some encounter information is confidential and restricted. Go to Review Flowsheets activity to see all data.      Anxiety and depression Controlled and managed by Psych  Essential hypertension Controlled, no change in medication DASH diet and commitment to daily physical activity for a minimum of 30 minutes discussed and encouraged, as a part of hypertension management. The importance of attaining a healthy weight is also discussed.  BP/Weight 05/13/2019 04/23/2019 04/03/2019 03/31/2019 03/26/2019 0000000 123456  Systolic BP [id number] Q000111Q 0000000 [id number] [id number]  Diastolic BP 72 61 59 54 61 68 65  Wt. (Lbs) 260.12 273.08 274 273.8 270.9 267 268  BMI 39.55 41.52 41.66 40.43 40 40.01 40.75  Some encounter information is confidential and restricted. Go to Review Flowsheets activity to see all data.       Type 2 diabetes mellitus with hyperglycemia ([place]) Uncontrolled and followed by Endo [name] is reminded of the importance of commitment to daily physical activity for 30 minutes or more, as able and the need to limit carbohydrate intake  to 30 to 60 grams per meal to help with blood sugar control.   The need to take medication as prescribed, test blood sugar as directed, and to call between visits if there is a concern that blood sugar is uncontrolled is also discussed.   [name] is reminded of the importance of daily foot exam, annual eye examination, and good blood sugar, blood pressure and cholesterol control.  Diabetic Labs Latest Ref Rng & Units 03/31/2019 11/26/2018 08/14/2018 04/11/2018 03/03/2018  HbA1c 4.0 - 5.6 % 9.3(A) 8.5(H) 8.6(A) 7.7(A) -   Microalbumin mg/dL - 33.9 - - -  Micro/Creat Ratio 0.0 - 30.0 mg/g creat - - - - -  Chol <200 mg/dL - 171 - - 181  HDL > OR = 50 mg/dL - 60 - - 65  Calc LDL mg/dL (calc) - 90 - - 98  Triglycerides <150 mg/dL - 118 - - 86  Creatinine 0.60 - 0.93 mg/dL - 0.79 - - -   BP/Weight 05/13/2019 04/23/2019 04/03/2019 03/31/2019 03/26/2019 0000000 123456  Systolic BP [id number] Q000111Q 0000000 [id number] [id number]  Diastolic BP 72 61 59 54 61 68 65  Wt. (Lbs) 260.12 273.08 274 273.8 270.9 267 268  BMI 39.55 41.52 41.66 40.43 40 40.01 40.75  Some encounter information is confidential and restricted. Go to Review Flowsheets activity to see all data.   Foot/eye exam completion dates Latest Ref Rng & Units 08/14/2018 04/11/2018  Eye Exam No Retinopathy - -  Foot Form Completion - Done Done

## 2019-05-13 NOTE — Patient Instructions (Addendum)
F/U with MD in February, call if you need me before  Please schedule  mammogram at checkout Flu vaccine today  Fasting lipid , cmp and EGFR end September  You are treated today for a presumed urinary tract infection, urine is being sent for c/s  Ciprofloxacin one two times daily for infection  Ensure you drink sufficient water regularly and empty bladder regularly  Thanks for choosing [facility], we consider it a privelige to serve you.

## 2019-05-14 ENCOUNTER — Other Ambulatory Visit (HOSPITAL_COMMUNITY)
Admission: RE | Admit: 2019-05-14 | Discharge: 2019-05-14 | Disposition: A | Discharge status: Home / Self Care | Payer: Medicare HMO | Source: Ambulatory Visit | Attending: Family Medicine | Admitting: Family Medicine

## 2019-05-14 DIAGNOSIS — N3000 Acute cystitis without hematuria: Secondary | ICD-10-CM | POA: Diagnosis not present

## 2019-05-15 LAB — URINE CULTURE: Culture: NO GROWTH

## 2019-05-16 ENCOUNTER — Encounter: Payer: Self-pay | Admitting: Family Medicine

## 2019-05-16 DIAGNOSIS — N3000 Acute cystitis without hematuria: Secondary | ICD-10-CM | POA: Insufficient documentation

## 2019-05-16 NOTE — Assessment & Plan Note (Signed)
Obesity linked with hypertension and diabetes  Patient re-educated about  the importance of commitment to a  minimum of 150 minutes of exercise per week as able.  The importance of healthy food choices with portion control discussed, as well as eating regularly and within a 12 hour window most days. The need to choose "clean , green" food 50 to 75% of the time is discussed, as well as to make water the primary drink and set a goal of 64 ounces water daily.    Weight /BMI 05/13/2019 04/23/2019 04/03/2019  WEIGHT 260 lb 1.9 oz 273 lb 1.3 oz 274 lb  HEIGHT 5\' 8"  5\' 8"  5\' 8"   BMI 39.55 kg/m2 41.52 kg/m2 41.66 kg/m2  Some encounter information is confidential and restricted. Go to Review Flowsheets activity to see all data.

## 2019-05-16 NOTE — Assessment & Plan Note (Signed)
Uncontrolled and followed by Endo Ms. Bails is reminded of the importance of commitment to daily physical activity for 30 minutes or more, as able and the need to limit carbohydrate intake to 30 to 60 grams per meal to help with blood sugar control.   The need to take medication as prescribed, test blood sugar as directed, and to call between visits if there is a concern that blood sugar is uncontrolled is also discussed.   Ms. Lehan is reminded of the importance of daily foot exam, annual eye examination, and good blood sugar, blood pressure and cholesterol control.  Diabetic Labs Latest Ref Rng & Units 03/31/2019 11/26/2018 08/14/2018 04/11/2018 03/03/2018  HbA1c 4.0 - 5.6 % 9.3(A) 8.5(H) 8.6(A) 7.7(A) -  Microalbumin mg/dL - 33.9 - - -  Micro/Creat Ratio 0.0 - 30.0 mg/g creat - - - - -  Chol <200 mg/dL - 171 - - 181  HDL > OR = 50 mg/dL - 60 - - 65  Calc LDL mg/dL (calc) - 90 - - 98  Triglycerides <150 mg/dL - 118 - - 86  Creatinine 0.60 - 0.93 mg/dL - 0.79 - - -   BP/Weight 05/13/2019 04/23/2019 04/03/2019 03/31/2019 03/26/2019 0000000 123456  Systolic BP AB-123456789 Q000111Q 0000000 123XX123 AB-123456789 [id number]  Diastolic BP 72 61 59 54 61 68 65  Wt. (Lbs) 260.12 273.08 274 273.8 270.9 267 268  BMI 39.55 41.52 41.66 40.43 40 40.01 40.75  Some encounter information is confidential and restricted. Go to Review Flowsheets activity to see all data.   Foot/eye exam completion dates Latest Ref Rng & Units 08/14/2018 04/11/2018  Eye Exam No Retinopathy - -  Foot Form Completion - Done Done

## 2019-05-16 NOTE — Assessment & Plan Note (Signed)
CCUA abnormal, 3 day cipro and send for c/s

## 2019-05-16 NOTE — Assessment & Plan Note (Signed)
Controlled, no change in medication DASH diet and commitment to daily physical activity for a minimum of 30 minutes discussed and encouraged, as a part of hypertension management. The importance of attaining a healthy weight is also discussed.  BP/Weight 05/13/2019 04/23/2019 04/03/2019 03/31/2019 03/26/2019 0000000 123456  Systolic BP AB-123456789 Q000111Q 0000000 [id number] AB-123456789 [id number]  Diastolic BP 72 61 59 54 61 68 65  Wt. (Lbs) 260.12 273.08 274 273.8 270.9 267 268  BMI 39.55 41.52 41.66 40.43 40 40.01 40.75  Some encounter information is confidential and restricted. Go to Review Flowsheets activity to see all data.

## 2019-05-16 NOTE — Assessment & Plan Note (Signed)
Controlled and managed by Psych 

## 2019-05-18 ENCOUNTER — Other Ambulatory Visit (HOSPITAL_COMMUNITY): Payer: Self-pay | Admitting: Psychiatry

## 2019-05-20 DIAGNOSIS — Z23 Encounter for immunization: Secondary | ICD-10-CM | POA: Diagnosis not present

## 2019-05-25 ENCOUNTER — Ambulatory Visit (HOSPITAL_COMMUNITY): Payer: Medicare HMO

## 2019-05-28 ENCOUNTER — Other Ambulatory Visit: Payer: Self-pay

## 2019-06-02 ENCOUNTER — Encounter: Payer: Self-pay | Admitting: Endocrinology

## 2019-06-02 ENCOUNTER — Encounter: Payer: Medicare HMO | Attending: Endocrinology | Admitting: Nutrition

## 2019-06-02 ENCOUNTER — Ambulatory Visit (INDEPENDENT_AMBULATORY_CARE_PROVIDER_SITE_OTHER): Payer: Medicare HMO | Admitting: Endocrinology

## 2019-06-02 ENCOUNTER — Other Ambulatory Visit: Payer: Self-pay

## 2019-06-02 VITALS — BP 146/58 | HR 77 | Temp 98.2°F | Wt 272.2 lb

## 2019-06-02 DIAGNOSIS — E1165 Type 2 diabetes mellitus with hyperglycemia: Secondary | ICD-10-CM

## 2019-06-02 DIAGNOSIS — Z794 Long term (current) use of insulin: Secondary | ICD-10-CM | POA: Diagnosis not present

## 2019-06-02 LAB — POCT GLYCOSYLATED HEMOGLOBIN (HGB A1C): Hemoglobin A1C: 8.1 % — AB (ref 4.0–5.6)

## 2019-06-02 MED ORDER — NOVOLIN 70/30 (70-30) 100 UNIT/ML ~~LOC~~ SUSP: 80.0000 [IU] | Freq: Every day | SUBCUTANEOUS | 11 refills | Status: DC

## 2019-06-02 NOTE — Progress Notes (Signed)
Subjective:    Patient ID: Laura Atkins, female    DOB: 04-Sep-1941, 78 y.o.   MRN: [id number]  HPI Pt returns for f/u of diabetes mellitus: DM type: insulin-requiring type 2.   Dx'ed: [id number] Complications: polyneuropathy, PAD, and foot ulcer.   Therapy: insulin since 2005.   GDM: never.  DKA: never Severe hypoglycemia: never.   Pancreatitis: never.   Other info: in early 2016, she changed to BID premixed insulin, due to poor results with multiple daily injections.  She takes human insulin, due to cost.   Interval history: pt says she misses the insulin approx 2 doses per week.  she brings a record of her cbg's which I have reviewed today.  cbg varies from 52-437.  She has mild hypoglycemia recorded on her cbg record approx twice per month.  She has severe hypoglycemia approx twice per week.  This usually happens in the middle of the night.   Past Medical History:  Diagnosis Date  . Alpha thalassemia trait 01/26/2010   02/2012: Nl CBC ex H&H-10.7/34.8, MCV-69   . Anemia   . Anxiety   . Anxiety and depression   . Cellulitis 05/09/2017  . Depression   . Diabetes mellitus   . GERD (gastroesophageal reflux disease)   . Headache(784.0)   . Hyperlipidemia   . Hypertension   . Iron deficiency 01/21/2017  . Microcytic anemia 01/26/2010   02/2012: Nl CBC ex H&H-10.7/34.8, MCV-69   . Obesity   . Obstructive sleep apnea   . Osteoarthritis    Left knee; right shoulder; chronic neck and back pain  . Pruritus   . Seizures ([place])   . Tremor    This started months ago after her seizure progressing to very poor hand writing  . Urinary incontinence   . UTI (urinary tract infection) 01/18/2013    Past Surgical History:  Procedure Laterality Date  . ABDOMINAL HYSTERECTOMY    . BREAST EXCISIONAL BIOPSY     Left; cyst  . CATARACT EXTRACTION Right    12/2017  . CATARACT EXTRACTION W/ INTRAOCULAR LENS IMPLANT Left 09/07/2013  . CHOLECYSTECTOMY    . COLONOSCOPY    . COLONOSCOPY N/A 07/20/2015   Procedure: COLONOSCOPY;  Surgeon: Rogene Houston, MD;  Location: [facility];  Service: Endoscopy;  Laterality: N/A;  930  . EYE SURGERY Left 09/07/2013   cataract    Social History   Socioeconomic History  . Marital status: Married    Spouse name: saunders  . Number of children: 5  . Years of education: 65  . Highest education level: Not on file  Occupational History  . Occupation: Disabled     Employer: RETIRED  Social Needs  . Financial resource strain: Not hard at all  . Food insecurity    Worry: Never true    Inability: Never true  . Transportation needs    Medical: No    Non-medical: No  Tobacco Use  . Smoking status: Never Smoker  . Smokeless tobacco: Never Used  Substance and Sexual Activity  . Alcohol use: No    Alcohol/week: 0.0 standard drinks  . Drug use: No  . Sexual activity: Yes    Birth control/protection: Surgical  Lifestyle  . Physical activity    Days per week: 0 days    Minutes per session: 0 min  . Stress: Only a little  Relationships  . Social Herbalist on phone: Twice a week    Gets together: Twice a  week    Attends religious service: More than 4 times per year    Active member of club or organization: Yes    Attends meetings of clubs or organizations: More than 4 times per year    Relationship status: Married  . Intimate partner violence    Fear of current or ex partner: No    Emotionally abused: No    Physically abused: No    Forced sexual activity: No  Other Topics Concern  . Not on file  Social History Narrative   Patient lives at home with her husband [name].  Patient is retired.    Right handed.    Five Children.    Caffeine- 2 daily    Current Outpatient Medications on File Prior to Visit  Medication Sig Dispense Refill  . Accu-Chek FastClix Lancets MISC 1 each by Does not apply route 4 (four) times daily. USE TO TEST BLOOD SUGAR FOUR TIMES DAILY; E11.65 102 each 2  . acetaminophen (EXTRA STRENGTH PAIN  RELIEF) 500 MG tablet Take 500 mg by mouth every 6 (six) hours as needed for pain.    Marland Kitchen amLODipine (NORVASC) 10 MG tablet TAKE 1 TABLET BY MOUTH EVERY DAY 30 tablet 2  . aspirin 81 MG tablet Take 81 mg by mouth daily.      Marland Kitchen atorvastatin (LIPITOR) 40 MG tablet TAKE 1 TABLET BY MOUTH EVERY DAY 30 tablet 2  . benazepril (LOTENSIN) 20 MG tablet TAKE 1 TABLET BY MOUTH EVERY DAY 30 tablet 2  . buPROPion (WELLBUTRIN XL) 150 MG 24 hr tablet TAKE 1 TABLET BY MOUTH EVERY MORNING 30 tablet 2  . ciprofloxacin (CIPRO) 500 MG tablet Take 1 tablet (500 mg total) by mouth 2 (two) times daily. 6 tablet 0  . Continuous Blood Gluc Sensor (FREESTYLE LIBRE 14 DAY SENSOR) MISC 1 Device by Does not apply route every 14 (fourteen) days. 6 each 3  . diclofenac (VOLTAREN) 75 MG EC tablet Take 1 tablet (75 mg total) by mouth 2 (two) times daily. 30 tablet 0  . diphenhydrAMINE (BENADRYL) 25 mg capsule Take 25 mg by mouth at bedtime. Reported on 10/18/2015    . gabapentin (NEURONTIN) 400 MG capsule TAKE ONE CAPSULE BY MOUTH EVERY MORNING and TAKE ONE CAPSULE AT NOON and TAKE TWO CAPSULES AT BEDTIME 360 capsule 1  . gentamicin cream (GARAMYCIN) 0.1 % Apply 1 application topically 2 (two) times daily. 30 g 1  . glucose blood (ACCU-CHEK GUIDE) test strip USE TO TEST BLOOD SUGAR FOUR TIMES DAILY; E11.65 100 each 12  . Insulin Pen Needle 31G X 8 MM MISC Used to give daily insulin injections. 100 each prn  . Multiple Vitamin (MULTIVITAMIN) capsule Take 1 capsule by mouth daily.    . mupirocin ointment (BACTROBAN) 2 % Apply to right heel once daily 22 g 1  . omeprazole (PRILOSEC) 20 MG capsule TAKE ONE CAPSULE BY MOUTH EVERY DAY 30 capsule 2  . potassium chloride SA (K-DUR,KLOR-CON) 20 MEQ tablet TAKE 1 TABLET BY MOUTH TWICE DAILY 180 tablet 1  . sertraline (ZOLOFT) 100 MG tablet Take 1 tablet (100 mg total) by mouth daily. 60 tablet 5  . torsemide (DEMADEX) 20 MG tablet TAKE TWO TABLETS BY MOUTH EVERY DAY (STOP lasix) 180 tablet 3   . UNABLE TO FIND Walker x 1  DX unsteady gait, osteoarthritis of left knee 1 each 0  . UNABLE TO FIND Elevated Toliet Seat x 1 DX: unsteady gait, back pain, osteoarthritis of left knee 1 each  0   No current facility-administered medications on file prior to visit.     Allergies  Allergen Reactions  . Penicillins Shortness Of Breath, Itching and Rash  . Prednisone Shortness Of Breath, Itching and Rash  . Propoxyphene N-Acetaminophen Itching and Nausea And Vomiting    Family History  Problem Relation Age of Onset  . Lung cancer Mother 47  . Kidney disease Father   . Diabetes Sister   . Keloids Brother   . ADD / ADHD Grandchild   . Bipolar disorder Grandchild   . Bipolar disorder Daughter   . Seizures Daughter   . Heart disease Daughter   . Kidney disease Son   . Neuropathy Son   . Kidney disease Son   . Edema Daughter   . Breast cancer Daughter 37  . Allergies Daughter   . Alcohol abuse Neg Hx   . Drug abuse Neg Hx     BP (!) 146/58   Pulse 77   Temp 98.2 F (36.8 C)   Wt 272 lb 3.2 oz (123.5 kg)   SpO2 99%   BMI 41.39 kg/m    Review of Systems Denies weight change.      Objective:   Physical Exam VITAL SIGNS:  See vs page GENERAL: no distress Pulses: left DP pulse is present.  MSK: no deformity of the left foot or ankle.  CV: 1+ left leg edema.  Skin:  no ulcer on the left foot or ankle.  normal color and temp on the left foot and ankle.   Neuro: sensation is intact to touch on the feet.   [id number] is bilateral onychomycosis of the toenails.  right ankle is in an unna boot.    A1c=8.1%  Lab Results  Component Value Date   CREATININE 0.79 11/26/2018   BUN 13 11/26/2018   NA 141 11/26/2018   K 4.0 11/26/2018   CL 102 11/26/2018   CO2 32 11/26/2018      Assessment & Plan:  HTN: is noted today Insulin-requiring type 2 DM, with PAD.  Hypoglycemia: worse  Patient Instructions  Your blood pressure is high today.  Please see your primary care  provider soon, to have it rechecked.   check your blood sugar twice a day.  vary the time of day when you check, between before the 3 meals, and at bedtime.  also check if you have symptoms of your blood sugar being too high or too low.  please keep a record of the readings and bring it to your next appointment here (or you can bring the meter itself).  You can write it on any piece of paper.  please call us sooner if your blood sugar goes below 70, or if you have a lot of readings over 200. Please stop taking the evening insulin.   On this type of insulin schedule, you should eat meals on a regular schedule.  If a meal is missed or significantly delayed, your blood sugar could go low.   Whenever it goes low, please write on the form why you think it has gone low (such as a meal being missed or delayed).   Please come back for a follow-up appointment in 2 months.

## 2019-06-02 NOTE — Patient Instructions (Addendum)
Your blood pressure is high today.  Please see your primary care provider soon, to have it rechecked.   check your blood sugar twice a day.  vary the time of day when you check, between before the 3 meals, and at bedtime.  also check if you have symptoms of your blood sugar being too high or too low.  please keep a record of the readings and bring it to your next appointment here (or you can bring the meter itself).  You can write it on any piece of paper.  please call us sooner if your blood sugar goes below 70, or if you have a lot of readings over 200. Please stop taking the evening insulin.   On this type of insulin schedule, you should eat meals on a regular schedule.  If a meal is missed or significantly delayed, your blood sugar could go low.   Whenever it goes low, please write on the form why you think it has gone low (such as a meal being missed or delayed).   Please come back for a follow-up appointment in 2 months.

## 2019-06-07 ENCOUNTER — Other Ambulatory Visit: Payer: Self-pay | Admitting: Family Medicine

## 2019-06-11 ENCOUNTER — Other Ambulatory Visit: Payer: Self-pay | Admitting: Family Medicine

## 2019-06-12 ENCOUNTER — Other Ambulatory Visit: Payer: Self-pay

## 2019-06-12 DIAGNOSIS — E1165 Type 2 diabetes mellitus with hyperglycemia: Secondary | ICD-10-CM

## 2019-06-12 DIAGNOSIS — Z794 Long term (current) use of insulin: Secondary | ICD-10-CM

## 2019-06-12 MED ORDER — ACCU-CHEK GUIDE VI STRP: 1.0000 | ORAL_STRIP | Freq: Four times a day (QID) | 2 refills | Status: DC

## 2019-06-12 MED ORDER — ACCU-CHEK GUIDE ME W/DEVICE KIT: 1.0000 | PACK | Freq: Four times a day (QID) | 0 refills | Status: DC

## 2019-06-12 MED ORDER — ACCU-CHEK FASTCLIX LANCETS MISC: 1.0000 | Freq: Four times a day (QID) | 2 refills | Status: DC

## 2019-06-12 MED ORDER — "INSULIN SYRINGE 31G X 5/16"" 1 ML MISC": 1.0000 | Freq: Every day | 2 refills | Status: DC

## 2019-06-12 MED ORDER — BD SWAB SINGLE USE REGULAR PADS: 1.0000 | MEDICATED_PAD | Freq: Four times a day (QID) | 2 refills | Status: DC

## 2019-06-18 ENCOUNTER — Telehealth: Payer: Self-pay | Admitting: Cardiology

## 2019-06-18 NOTE — Telephone Encounter (Signed)
FYI.  °Contacted patient regarding recall appointment, patient notified our office they did not wish to keep this appointment at this time.  Deleted recall from system. °

## 2019-06-26 ENCOUNTER — Other Ambulatory Visit: Payer: Self-pay | Admitting: Endocrinology

## 2019-07-04 NOTE — Patient Instructions (Addendum)
Eat breakfast and lunch every day.  Limit juice at breakfast to no more than 3-4 ounces.   Call if questions.

## 2019-07-04 NOTE — Progress Notes (Signed)
Laura Atkins and her daughter are here to discuss diet and blood sugar readings.  She is having several low blood sugars.  Discussed symptoms and appropriate treatments for this.Memory is very limited.  According to daughter:  Diet:  Daughter fixes her dinner and sometimes breakfast.  Not sure of quantities eaten.  Daughter says usually very little amounts of protein and carbs.  Not sure what she is doing during the day when she is at work.  Patient reports that she fixed herself a sandwich and eats at least half of this , or has something heated up from the night before.  Drinking water at lunch, usually.  Occasional juice at breakfast, which was discouraged, and agreed to by patient.   Meals are generally balance and not high in fat. Discussed how her insulin works, how/where to take the insulin, and how to store it.    They reported good understanidng of this.  We also discussed the need for lunch every day, on time.  They agreed with this as well and had no final questions. Daughter drawing up and giving the injections to her mother in the morning before going to work.

## 2019-07-05 ENCOUNTER — Other Ambulatory Visit: Payer: Self-pay | Admitting: Family Medicine

## 2019-07-22 ENCOUNTER — Ambulatory Visit (INDEPENDENT_AMBULATORY_CARE_PROVIDER_SITE_OTHER): Payer: Medicare HMO | Admitting: Family Medicine

## 2019-07-22 ENCOUNTER — Other Ambulatory Visit: Payer: Self-pay

## 2019-07-22 ENCOUNTER — Encounter: Payer: Self-pay | Admitting: Family Medicine

## 2019-07-22 VITALS — BP 146/58 | Ht 68.0 in | Wt 272.0 lb

## 2019-07-22 DIAGNOSIS — I10 Essential (primary) hypertension: Secondary | ICD-10-CM | POA: Diagnosis not present

## 2019-07-22 DIAGNOSIS — Z794 Long term (current) use of insulin: Secondary | ICD-10-CM | POA: Diagnosis not present

## 2019-07-22 DIAGNOSIS — E1165 Type 2 diabetes mellitus with hyperglycemia: Secondary | ICD-10-CM

## 2019-07-22 DIAGNOSIS — G63 Polyneuropathy in diseases classified elsewhere: Secondary | ICD-10-CM | POA: Diagnosis not present

## 2019-07-22 DIAGNOSIS — D560 Alpha thalassemia: Secondary | ICD-10-CM | POA: Diagnosis not present

## 2019-07-22 MED ORDER — HYDROXYZINE HCL 10 MG PO TABS: ORAL_TABLET | ORAL | 0 refills | Status: DC

## 2019-07-22 NOTE — Patient Instructions (Addendum)
F/U as before, call if you need me sooner  I have prescribed hydroxyzine for use at bedtime , as needed  Blood sugar has improved, continue to get to HBA1C under 8  Think about what you will eat, plan ahead. Choose " clean, green, fresh or frozen" over canned, processed or packaged foods which are more sugary, salty and fatty. 70 to 75% of food eaten should be vegetables and fruit. Three meals at set times with snacks allowed between meals, but they must be fruit or vegetables. Aim to eat over a 12 hour period , example 7 am to 7 pm, and STOP after  your last meal of the day. Drink water,generally about 64 ounces per day, no other drink is as healthy. Fruit juice is best enjoyed in a healthy way, by EATING the fruit. Thanks for choosing [facility], we consider it a privelige to serve you.

## 2019-07-22 NOTE — Progress Notes (Signed)
Virtual Visit via Telephone Note  I connected with Laura Atkins on 07/22/19 at 11:00 AM EDT by telephone and verified that I am speaking with the correct person using two identifiers.  Location: Patient: home Provider: office   I discussed the limitations, risks, security and privacy concerns of performing an evaluation and management service by telephone and the availability of in person appointments. I also discussed with the patient that there may be a patient responsible charge related to this service. The patient expressed understanding and agreed to proceed.   History of Present Illness: C/o itching of both feet just last night, had it in the past , and this responded to benadryl No rash noted on feet , only darkness of feet which is unchanged Sees Podiatry regularly, and has an appt next week Denies recent fever or chills. Denies sinus pressure, nasal congestion, ear pain or sore throat. Denies chest congestion, productive cough or wheezing. Denies chest pains, palpitations and leg swelling Denies abdominal pain, nausea, vomiting,diarrhea or constipation.   Denies dysuria, frequency, hesitancy or incontinence. C/o chronicjoint pain, swelling and limitation in mobility.No recent fallsDenies headaches, seizures, numbness, or tingling. Denies uncontrolled  depression, anxiety or insomnia. Denies skin break down or rash. Denies polyuria, polydipsia, blurred vision , or hypoglycemic episodes. States blood sugar still higher than it should be , but she is working on this       Observations/Objective:  BP (!) 146/58   Ht 5\' 8"  (1.727 m)   Wt 272 lb (123.4 kg)   BMI 41.36 kg/m  Good communication with no confusion and intact memory. Alert and oriented x 3 No signs of respiratory distress during speech   Assessment and Plan:   Follow Up Instructions:    I discussed the assessment and treatment plan with the patient. The patient was provided an opportunity to ask  questions and all were answered. The patient agreed with the plan and demonstrated an understanding of the instructions.   The patient was advised to call back or seek an in-person evaluation if the symptoms worsen or if the condition fails to improve as anticipated.  I provided 21 minutes of non-face-to-face time during this encounter.   Tula Nakayama, MD

## 2019-07-23 ENCOUNTER — Ambulatory Visit: Payer: Medicare HMO | Admitting: Family Medicine

## 2019-07-26 ENCOUNTER — Encounter: Payer: Self-pay | Admitting: Family Medicine

## 2019-07-26 NOTE — Assessment & Plan Note (Signed)
  Patient re-educated about  the importance of commitment to a  minimum of 150 minutes of exercise per week as able.  The importance of healthy food choices with portion control discussed, as well as eating regularly and within a 12 hour window most days. The need to choose "clean , green" food 50 to 75% of the time is discussed, as well as to make water the primary drink and set a goal of 64 ounces water daily.    Weight /BMI 07/22/2019 06/02/2019 05/13/2019  WEIGHT 272 lb 272 lb 3.2 oz 260 lb 1.9 oz  HEIGHT 5\' 8"  - 5\' 8"   BMI 41.36 kg/m2 41.39 kg/m2 39.55 kg/m2  Some encounter information is confidential and restricted. Go to Review Flowsheets activity to see all data.

## 2019-07-26 NOTE — Assessment & Plan Note (Signed)
Not at goal from record review, will evaluate in office DASH diet and commitment to daily physical activity for a minimum of 30 minutes discussed and encouraged, as a part of hypertension management. The importance of attaining a healthy weight is also discussed.  BP/Weight 07/22/2019 06/02/2019 05/13/2019 04/23/2019 04/03/2019 [id number]  Systolic BP 123456 123456 [id number] 0000000 [id number]  Diastolic BP 58 58 72 61 59 54 61  Wt. (Lbs) 272 272.2 260.12 273.08 274 273.8 270.9  BMI 41.36 41.39 39.55 41.52 41.66 40.43 40  Some encounter information is confidential and restricted. Go to Review Flowsheets activity to see all data.

## 2019-07-26 NOTE — Assessment & Plan Note (Signed)
Managed by endo, uncontrolled , but improving, she is applauded on this Laura Atkins is reminded of the importance of commitment to daily physical activity for 30 minutes or more, as able and the need to limit carbohydrate intake to 30 to 60 grams per meal to help with blood sugar control.   The need to take medication as prescribed, test blood sugar as directed, and to call between visits if there is a concern that blood sugar is uncontrolled is also discussed.   Laura Atkins is reminded of the importance of daily foot exam, annual eye examination, and good blood sugar, blood pressure and cholesterol control.  Diabetic Labs Latest Ref Rng & Units 06/02/2019 03/31/2019 11/26/2018 08/14/2018 04/11/2018  HbA1c 4.0 - 5.6 % 8.1(A) 9.3(A) 8.5(H) 8.6(A) 7.7(A)  Microalbumin mg/dL - - 33.9 - -  Micro/Creat Ratio 0.0 - 30.0 mg/g creat - - - - -  Chol <200 mg/dL - - 171 - -  HDL > OR = 50 mg/dL - - 60 - -  Calc LDL mg/dL (calc) - - 90 - -  Triglycerides <150 mg/dL - - 118 - -  Creatinine 0.60 - 0.93 mg/dL - - 0.79 - -   BP/Weight 07/22/2019 06/02/2019 05/13/2019 04/23/2019 04/03/2019 [id number]  Systolic BP 123456 123456 [id number] [id number] 0000000 [id number]  Diastolic BP 58 58 72 61 59 54 61  Wt. (Lbs) 272 272.2 260.12 273.08 274 273.8 270.9  BMI 41.36 41.39 39.55 41.52 41.66 40.43 40  Some encounter information is confidential and restricted. Go to Review Flowsheets activity to see all data.   Foot/eye exam completion dates Latest Ref Rng & Units 08/14/2018 04/11/2018  Eye Exam No Retinopathy - -  Foot Form Completion - Done Done

## 2019-07-26 NOTE — Assessment & Plan Note (Addendum)
UNCONTROLLED ITCHING IN SOLES OF FEET. Start bedtime hydroxyzine

## 2019-07-27 ENCOUNTER — Other Ambulatory Visit: Payer: Self-pay

## 2019-07-27 ENCOUNTER — Ambulatory Visit: Payer: Medicare HMO | Admitting: Podiatry

## 2019-07-27 DIAGNOSIS — L989 Disorder of the skin and subcutaneous tissue, unspecified: Secondary | ICD-10-CM | POA: Diagnosis not present

## 2019-07-27 DIAGNOSIS — E0843 Diabetes mellitus due to underlying condition with diabetic autonomic (poly)neuropathy: Secondary | ICD-10-CM | POA: Diagnosis not present

## 2019-07-27 DIAGNOSIS — B351 Tinea unguium: Secondary | ICD-10-CM

## 2019-07-27 DIAGNOSIS — M79676 Pain in unspecified toe(s): Secondary | ICD-10-CM

## 2019-07-29 NOTE — Progress Notes (Signed)
    Subjective: Patient is a 78 y.o. female presenting to the office today for follow up evaluation of a painful lesion(s) noted to the right heel. Walking and bearing weight on the area increases the pain. She has not had any treatment at home for the complaint.  Patient also complains of elongated, thickened nails that cause pain while ambulating in shoes. She is unable to trim her own nails. Patient presents today for further treatment and evaluation.  Past Medical History:  Diagnosis Date  . Alpha thalassemia trait 01/26/2010   02/2012: Nl CBC ex H&H-10.7/34.8, MCV-69   . Anemia   . Anxiety   . Anxiety and depression   . Cellulitis 05/09/2017  . Depression   . Diabetes mellitus   . GERD (gastroesophageal reflux disease)   . Headache(784.0)   . Hyperlipidemia   . Hypertension   . Iron deficiency 01/21/2017  . Microcytic anemia 01/26/2010   02/2012: Nl CBC ex H&H-10.7/34.8, MCV-69   . Obesity   . Obstructive sleep apnea   . Osteoarthritis    Left knee; right shoulder; chronic neck and back pain  . Pruritus   . Seizures ([place])   . Tremor    This started months ago after her seizure progressing to very poor hand writing  . Urinary incontinence   . UTI (urinary tract infection) 01/18/2013    Objective:  Physical Exam General: Alert and oriented x3 in no acute distress  Dermatology: Hyperkeratotic lesion(s) present on the right heel. Pain on palpation with a central nucleated core noted. Skin is warm, dry and supple bilateral lower extremities. Negative for open lesions or macerations. Nails are tender, long, thickened and dystrophic with subungual debris, consistent with onychomycosis, 1-5 bilateral. No signs of infection noted.  Vascular: Palpable pedal pulses bilaterally. No edema or erythema noted. Capillary refill within normal limits.  Neurological: Epicritic and protective threshold diminished bilaterally.   Musculoskeletal Exam: Pain on palpation at the keratotic lesion(s)  noted. Range of motion within normal limits bilateral. Muscle strength 5/5 in all groups bilateral.  Assessment: 1. Onychodystrophic nails 1-5 bilateral with hyperkeratosis of nails.  2. Onychomycosis of nail due to dermatophyte bilateral 3. Pre-ulcerative callus lesion noted to the right heel    Plan of Care:  1. Patient evaluated. 2. Excisional debridement of keratoic lesion(s) using a chisel blade was performed without incident.  3. Dressed with light dressing. 4. Mechanical debridement of nails 1-5 bilaterally performed using a nail nipper. Filed with dremel without incident.  5. Continue wearing compression hose.  6. Offloading heel cushion dispensed. Wear QHS.  7. Patient is to return to the clinic in 3 months.   Edrick Kins, DPM [facility]  Dr. Edrick Kins, [address]                                        [place], [place] [address]                Office [phone]  Fax [phone]

## 2019-08-04 ENCOUNTER — Other Ambulatory Visit (HOSPITAL_COMMUNITY): Payer: Self-pay | Admitting: Psychiatry

## 2019-08-04 NOTE — Telephone Encounter (Signed)
Call pt for appt 

## 2019-08-04 NOTE — Telephone Encounter (Signed)
SPOKE WITH PATIENT PER PROVIDER: Call pt for appt. APPT SCHEDULED FOR 08/19/2019

## 2019-08-05 ENCOUNTER — Telehealth: Payer: Self-pay | Admitting: Podiatry

## 2019-08-05 NOTE — Telephone Encounter (Signed)
I told pt that blistering on an ankle is not typical and she should be seen in office, I told pt that I would transfer to scheduler, and to leave her information on their voicemail if they did not answer and if the area worsened, had red or darkened streaks, fever or increase swelling to go to the ED. Transferred to Schedulers.

## 2019-08-05 NOTE — Telephone Encounter (Signed)
Patient called to say a blister has formed on the side of her rt ankle wanted to know if she should be seen or is there something she can put on it

## 2019-08-10 ENCOUNTER — Ambulatory Visit (INDEPENDENT_AMBULATORY_CARE_PROVIDER_SITE_OTHER): Payer: Medicare HMO | Admitting: Podiatry

## 2019-08-10 ENCOUNTER — Other Ambulatory Visit: Payer: Self-pay

## 2019-08-10 ENCOUNTER — Ambulatory Visit: Payer: Medicare HMO | Admitting: Podiatry

## 2019-08-10 DIAGNOSIS — L97312 Non-pressure chronic ulcer of right ankle with fat layer exposed: Secondary | ICD-10-CM

## 2019-08-10 DIAGNOSIS — E0843 Diabetes mellitus due to underlying condition with diabetic autonomic (poly)neuropathy: Secondary | ICD-10-CM | POA: Diagnosis not present

## 2019-08-10 DIAGNOSIS — I872 Venous insufficiency (chronic) (peripheral): Secondary | ICD-10-CM

## 2019-08-12 ENCOUNTER — Telehealth: Payer: Self-pay | Admitting: *Deleted

## 2019-08-12 NOTE — Telephone Encounter (Signed)
Pt called to check status of covid-19 result. No order or result found in chart. He voiced understanding and will check with his mom regarding where she got tested.

## 2019-08-12 NOTE — Progress Notes (Signed)
   Subjective:  78 y.o. female presenting today for follow up evaluation of a painful lesion noted to the right heel. She reports continued pain and tenderness that is increased by walking and bearing weight. She has been using the heel cushion and compression hose as directed. Patient is here for further evaluation and treatment.    Past Medical History:  Diagnosis Date  . Alpha thalassemia trait 01/26/2010   02/2012: Nl CBC ex H&H-10.7/34.8, MCV-69   . Anemia   . Anxiety   . Anxiety and depression   . Cellulitis 05/09/2017  . Depression   . Diabetes mellitus   . GERD (gastroesophageal reflux disease)   . Headache(784.0)   . Hyperlipidemia   . Hypertension   . Iron deficiency 01/21/2017  . Microcytic anemia 01/26/2010   02/2012: Nl CBC ex H&H-10.7/34.8, MCV-69   . Obesity   . Obstructive sleep apnea   . Osteoarthritis    Left knee; right shoulder; chronic neck and back pain  . Pruritus   . Seizures ([place])   . Tremor    This started months ago after her seizure progressing to very poor hand writing  . Urinary incontinence   . UTI (urinary tract infection) 01/18/2013     Objective/Physical Exam General: The patient is alert and oriented x3 in no acute distress.  Dermatology:  Wound #1 noted to the right ankle measuring approximately 0.5 x 1.0 x 0.1 cm (LxWxD).   To the noted ulceration(s), there is no eschar. There is a moderate amount of slough, fibrin, and necrotic tissue noted. Granulation tissue and wound base is red. There is a minimal amount of serosanguineous drainage noted. There is no exposed bone muscle-tendon ligament or joint. There is no malodor. Periwound integrity is intact. Hyperkeratotic lesion(s) present on the right heel. Pain on palpation with a central nucleated core noted.  Skin is warm, dry and supple bilateral lower extremities.  Vascular: Palpable pedal pulses bilaterally. Mild edema noted. Capillary refill within normal limits. Varicosities noted bilateral  lower extremities.   Neurological: Epicritic and protective threshold diminished bilaterally.   Musculoskeletal Exam: Range of motion within normal limits to all pedal and ankle joints bilateral. Muscle strength 5/5 in all groups bilateral.   Assessment: #1 Ulceration noted to the right ankle secondary to venous insufficiency #2 varicosities bilateral lower extremities #3 Pre-ulcerative callus lesions noted to the right heel   Plan of Care:  #1 Patient was evaluated. #2 medically necessary excisional debridement including subcutaneous tissue was performed using a tissue nipper and a chisel blade. Excisional debridement of all the necrotic nonviable tissue down to healthy bleeding viable tissue was performed with post-debridement measurements same as pre-. #3 the wound was cleansed with normal saline. #4 Recommended antibiotic ointment daily with a bandage.  #5 Continue using offloading heel cushion every night at bedtime.  #6 Continue using compression hose.  #7 Return to clinic in 2 months with Dr. Elisha Ponder.     Edrick Kins, DPM [facility]  Dr. Edrick Kins, [address]                                        [place], [place] [address]                Office [phone]  Fax [phone]

## 2019-08-14 ENCOUNTER — Other Ambulatory Visit: Payer: Self-pay

## 2019-08-14 ENCOUNTER — Encounter: Payer: Self-pay | Admitting: Family Medicine

## 2019-08-14 ENCOUNTER — Ambulatory Visit (INDEPENDENT_AMBULATORY_CARE_PROVIDER_SITE_OTHER): Payer: Medicare HMO | Admitting: Family Medicine

## 2019-08-14 ENCOUNTER — Telehealth: Payer: Self-pay | Admitting: *Deleted

## 2019-08-14 DIAGNOSIS — R8290 Unspecified abnormal findings in urine: Secondary | ICD-10-CM | POA: Diagnosis not present

## 2019-08-14 DIAGNOSIS — N3001 Acute cystitis with hematuria: Secondary | ICD-10-CM | POA: Diagnosis not present

## 2019-08-14 LAB — POCT URINALYSIS DIP (CLINITEK)
Bilirubin, UA: NEGATIVE
Glucose, UA: 100 mg/dL — AB
Ketones, POC UA: NEGATIVE mg/dL
Nitrite, UA: NEGATIVE
POC PROTEIN,UA: 30 — AB
Spec Grav, UA: 1.02 (ref 1.010–1.025)
Urobilinogen, UA: 0.2 E.U./dL
pH, UA: 5 (ref 5.0–8.0)

## 2019-08-14 MED ORDER — CIPROFLOXACIN HCL 250 MG PO TABS: 250.0000 mg | ORAL_TABLET | Freq: Two times a day (BID) | ORAL | 0 refills | Status: AC

## 2019-08-14 NOTE — Telephone Encounter (Signed)
Patient does not have any bladder medication on her med list. Double check by NP. Will ask patient when she has her telephone visit today.

## 2019-08-14 NOTE — Patient Instructions (Signed)
  I appreciate the opportunity to provide you with care for your health and wellness. Today we discussed: UTI  Follow up: 11/02/2019 as previously scheduled  No labs or referrals today  Urine positive for possible bacterial infection, sending this out will prescribe antibiotic at this time might need to adjust antibiotic depending on if the culture comes back saying that you need a different one.  I hope you have a wonderful, happy, safe, and healthy Holiday Season! See you in the New Year :)  Please continue to practice social distancing to keep you, your family, and our community safe.  If you must go out, please wear a mask and practice good handwashing.  It was a pleasure to see you and I look forward to continuing to work together on your health and well-being. Please do not hesitate to call the office if you need care or have questions about your care.  Have a wonderful day and week. With Gratitude, Cherly Beach, DNP, AGNP-BC

## 2019-08-14 NOTE — Progress Notes (Signed)
Virtual Visit via Telephone Note   This visit type was conducted due to national recommendations for restrictions regarding the COVID-19 Pandemic (e.g. social distancing) in an effort to limit this patient's exposure and mitigate transmission in our community.  Due to her co-morbid illnesses, this patient is at least at moderate risk for complications without adequate follow up.  This format is felt to be most appropriate for this patient at this time.  The patient did not have access to video technology/had technical difficulties with video requiring transitioning to audio format only (telephone).  All issues noted in this document were discussed and addressed.  No physical exam could be performed with this format.   Evaluation Performed:  Follow-up visit  Date:  08/14/2019   ID:  Laura Atkins 07/02/1941, MRN [id number]  Patient Location: Home Provider Location: Office  Location of Patient: Home Location of Provider: Telehealth Consent was obtain for visit to be over via telehealth. I verified that I am speaking with the correct person using two identifiers.  PCP:  Laura Helper, MD   Chief Complaint: Burning with urination  History of Present Illness:    Laura Atkins is a 78 y.o. female with history of anemia, anxiety, depression, diabetes, GERD, hypertension, hyperlipidemia, obesity among others.  Presents today with new onset of symptoms with burning in urination possible UTI.  Symptoms include burning with urination, cloudy urine, strong odor.  Denies having any pelvic pain, back pain.  The patient does not have symptoms concerning for COVID-19 infection (fever, chills, cough, or new shortness of breath).   Past Medical, Surgical, Social History, Allergies, and Medications have been Reviewed. Past Medical History:  Diagnosis Date   Alpha thalassemia trait 01/26/2010   02/2012: Nl CBC ex H&H-10.7/34.8, MCV-69    Anemia    Anxiety    Anxiety and  depression    Cellulitis 05/09/2017   Depression    Diabetes mellitus    GERD (gastroesophageal reflux disease)    Headache(784.0)    Hyperlipidemia    Hypertension    Iron deficiency 01/21/2017   Microcytic anemia 01/26/2010   02/2012: Nl CBC ex H&H-10.7/34.8, MCV-69    Obesity    Obstructive sleep apnea    Osteoarthritis    Left knee; right shoulder; chronic neck and back pain   Pruritus    Seizures ([facility])    Tremor    This started months ago after her seizure progressing to very poor hand writing   Urinary incontinence    UTI (urinary tract infection) 01/18/2013   Past Surgical History:  Procedure Laterality Date   ABDOMINAL HYSTERECTOMY     BREAST EXCISIONAL BIOPSY     Left; cyst   CATARACT EXTRACTION Right    12/2017   CATARACT EXTRACTION W/ INTRAOCULAR LENS IMPLANT Left 09/07/2013   CHOLECYSTECTOMY     COLONOSCOPY     COLONOSCOPY N/A 07/20/2015   Procedure: COLONOSCOPY;  Surgeon: Rogene Houston, MD;  Location: [facility];  Service: Endoscopy;  Laterality: N/A;  930   EYE SURGERY Left 09/07/2013   cataract     No outpatient medications have been marked as taking for the 08/14/19 encounter (Office Visit) with Laura Mayo, NP.     Allergies:   Penicillins, Prednisone, and Propoxyphene n-acetaminophen   Social History   Tobacco Use   Smoking status: Never Smoker   Smokeless tobacco: Never Used  Substance Use Topics   Alcohol use: No    Alcohol/week: 0.0  standard drinks   Drug use: No     Family Hx: The patient's family history includes ADD / ADHD in her grandchild; Allergies in her daughter; Bipolar disorder in her daughter and grandchild; Breast cancer (age of onset: 75) in her daughter; Diabetes in her sister; Edema in her daughter; Heart disease in her daughter; Keloids in her brother; Kidney disease in her father, son, and son; Lung cancer (age of onset: 57) in her mother; Neuropathy in her son; Seizures in her daughter. There  is no history of Alcohol abuse or Drug abuse.  ROS:   Please see the history of present illness.    All other systems reviewed and are negative.   Labs/Other Tests and Data Reviewed:     Recent Labs: 11/26/2018: ALT 14; BUN 13; Creat 0.79; Hemoglobin 10.4; Platelets 231; Potassium 4.0; Sodium 141; TSH 1.12   Recent Lipid Panel Lab Results  Component Value Date/Time   CHOL 171 11/26/2018 09:45 AM   TRIG 118 11/26/2018 09:45 AM   HDL 60 11/26/2018 09:45 AM   CHOLHDL 2.9 11/26/2018 09:45 AM   LDLCALC 90 11/26/2018 09:45 AM    Wt Readings from Last 3 Encounters:  07/22/19 272 lb (123.4 kg)  06/02/19 272 lb 3.2 oz (123.5 kg)  05/13/19 260 lb 1.9 oz (118 kg)     Objective:    Vital Signs:  There were no vitals taken for this visit.   GEN:  Alert and oriented RESPIRATORY:  No shortness of breath noted in conversation PSYCH:  Normal affect and mood  ASSESSMENT & PLAN:    1. Acute cystitis with hematuria Positive dip in the office.  We will be sending out for culture.  Covering with Cipro at this time.   If need to change based off culture findings we will.  - POCT URINALYSIS DIP (CLINITEK) - Urine Culture  2. Malodorous urine See #1  - POCT URINALYSIS DIP (CLINITEK) - Urine Culture   Time:   Today, I have spent 10 minutes with the patient with telehealth technology discussing the above problems.     Medication Adjustments/Labs and Tests Ordered: Current medicines are reviewed at length with the patient today.  Concerns regarding medicines are outlined above.   Tests Ordered: Orders Placed This Encounter  Procedures   Urine Culture   POCT URINALYSIS DIP (CLINITEK)    Medication Changes: No orders of the defined types were placed in this encounter.   Disposition:  Follow up 11/02/2019   Signed, Laura Mayo, NP  08/14/2019 11:07 AM     [facility]

## 2019-08-14 NOTE — Telephone Encounter (Signed)
Pt called back said she was having symptoms of UTI told her she would need to come by and drop off specimen she agreed will put her in for phone visit today after the specimen is dropped off

## 2019-08-14 NOTE — Telephone Encounter (Signed)
Pt needs a refill on her bladder pill she did not know the name of the medication

## 2019-08-17 ENCOUNTER — Other Ambulatory Visit (HOSPITAL_COMMUNITY)
Admission: RE | Admit: 2019-08-17 | Discharge: 2019-08-17 | Disposition: A | Discharge status: Home / Self Care | Payer: Medicare HMO | Source: Ambulatory Visit | Attending: Family Medicine | Admitting: Family Medicine

## 2019-08-17 DIAGNOSIS — R300 Dysuria: Secondary | ICD-10-CM | POA: Diagnosis not present

## 2019-08-17 DIAGNOSIS — R8290 Unspecified abnormal findings in urine: Secondary | ICD-10-CM | POA: Diagnosis not present

## 2019-08-17 NOTE — Telephone Encounter (Signed)
Was never able to reach patient for her phone visit.

## 2019-08-19 ENCOUNTER — Ambulatory Visit (HOSPITAL_COMMUNITY): Payer: Medicare HMO | Admitting: Psychiatry

## 2019-08-19 ENCOUNTER — Telehealth (HOSPITAL_COMMUNITY): Payer: Self-pay | Admitting: Psychiatry

## 2019-08-19 ENCOUNTER — Other Ambulatory Visit: Payer: Self-pay

## 2019-08-19 LAB — URINE CULTURE: Culture: 70000 — AB

## 2019-08-19 NOTE — Progress Notes (Signed)
Please call Laura Atkins and ask how she is feeling. Her urine cx shows resistance to the medication ordered. Is she having symptoms still? As we might need to order  A medication that the bacteria is sensitive to.

## 2019-08-24 ENCOUNTER — Telehealth: Payer: Self-pay

## 2019-08-24 NOTE — Telephone Encounter (Signed)
Please Call Oswaldo Milian back on specimen

## 2019-08-25 ENCOUNTER — Other Ambulatory Visit: Payer: Self-pay | Admitting: Family Medicine

## 2019-08-25 DIAGNOSIS — N3001 Acute cystitis with hematuria: Secondary | ICD-10-CM

## 2019-08-25 MED ORDER — SULFAMETHOXAZOLE-TRIMETHOPRIM 800-160 MG PO TABS: 1.0000 | ORAL_TABLET | Freq: Two times a day (BID) | ORAL | 0 refills | Status: AC

## 2019-08-25 NOTE — Telephone Encounter (Signed)
Spoke with patient and advised her new antibiotic has been called in with verbal understanding.

## 2019-08-25 NOTE — Telephone Encounter (Signed)
Pt is calling back no one called her yesterday would like a call back

## 2019-08-25 NOTE — Progress Notes (Signed)
I have sent in a antibiotic that is sensitivity to the bacteria she has.  Please encourage good hygiene practices.

## 2019-09-15 ENCOUNTER — Encounter (HOSPITAL_COMMUNITY): Payer: Self-pay | Admitting: Psychiatry

## 2019-09-15 ENCOUNTER — Other Ambulatory Visit: Payer: Self-pay

## 2019-09-15 ENCOUNTER — Ambulatory Visit (INDEPENDENT_AMBULATORY_CARE_PROVIDER_SITE_OTHER): Payer: Medicare HMO | Admitting: Psychiatry

## 2019-09-15 DIAGNOSIS — F329 Major depressive disorder, single episode, unspecified: Secondary | ICD-10-CM

## 2019-09-15 DIAGNOSIS — F32A Depression, unspecified: Secondary | ICD-10-CM

## 2019-09-15 MED ORDER — BUPROPION HCL ER (XL) 150 MG PO TB24: 150.0000 mg | ORAL_TABLET | Freq: Every morning | ORAL | 5 refills | Status: DC

## 2019-09-15 MED ORDER — SERTRALINE HCL 100 MG PO TABS: 100.0000 mg | ORAL_TABLET | Freq: Every day | ORAL | 5 refills | Status: DC
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
## 2019-10-04 ENCOUNTER — Other Ambulatory Visit: Payer: Self-pay | Admitting: Family Medicine

## 2019-10-05 ENCOUNTER — Other Ambulatory Visit: Payer: Self-pay | Admitting: Family Medicine

## 2019-10-26 ENCOUNTER — Other Ambulatory Visit: Payer: Self-pay

## 2019-10-26 ENCOUNTER — Encounter: Payer: Self-pay | Admitting: Podiatry

## 2019-10-26 ENCOUNTER — Ambulatory Visit (INDEPENDENT_AMBULATORY_CARE_PROVIDER_SITE_OTHER): Payer: Medicare HMO | Admitting: Podiatry

## 2019-10-26 DIAGNOSIS — Z794 Long term (current) use of insulin: Secondary | ICD-10-CM

## 2019-10-26 DIAGNOSIS — G8929 Other chronic pain: Secondary | ICD-10-CM | POA: Diagnosis not present

## 2019-10-26 DIAGNOSIS — M79676 Pain in unspecified toe(s): Secondary | ICD-10-CM | POA: Diagnosis not present

## 2019-10-26 DIAGNOSIS — I872 Venous insufficiency (chronic) (peripheral): Secondary | ICD-10-CM

## 2019-10-26 DIAGNOSIS — M79671 Pain in right foot: Secondary | ICD-10-CM

## 2019-10-26 DIAGNOSIS — E1165 Type 2 diabetes mellitus with hyperglycemia: Secondary | ICD-10-CM

## 2019-10-26 DIAGNOSIS — B351 Tinea unguium: Secondary | ICD-10-CM | POA: Diagnosis not present

## 2019-10-26 NOTE — Patient Instructions (Addendum)
MRS. [place]:  WEAR HEEL PROTECTORS AT ALL TIMES WHEN IN BED OR IF HEELS ARE TOUCHING RECLINER. DO NOT WALK WITH HEEL PROTECTORS ON.  TO CONTROL SWELLING IN LEGS, APPLY COMPRESSION STOCKINGS EVERY MORNING AND REMOVE EVERY EVENING.  Pressure Injury  A pressure injury is damage to the skin and underlying tissue that results from pressure being applied to an area of the body. It often affects people who must spend a long time in a bed or chair because of a medical condition. Pressure injuries usually occur:  Over bony parts of the body, such as the tailbone, shoulders, elbows, hips, heels, spine, ankles, and back of the head.  Under medical devices that make contact with the body, such as respiratory equipment, stockings, tubes, and splints. Pressure injuries start as reddened areas on the skin and can lead to pain and an open wound. What are the causes? This condition is caused by frequent or constant pressure to an area of the body. Decreased blood flow to the skin can eventually cause the skin tissue to die and break down, causing a wound. What increases the risk? You are more likely to develop this condition if you:  Are in the hospital or an extended care facility.  Are bedridden or in a wheelchair.  Have an injury or disease that keeps you from: ? Moving normally. ? Feeling pain or pressure.  Have a condition that: ? Makes you sleepy or less alert. ? Causes poor blood flow.  Need to wear a medical device.  Have poor control of your bladder or bowel functions (incontinence).  Have poor nutrition (malnutrition). If you are at risk for pressure injuries, your health care provider may recommend certain types of mattresses, mattress covers, pillows, cushions, or boots to help prevent them. These may include products filled with air, foam, gel, or sand. What are the signs or symptoms? Symptoms of this condition depend on the severity of the injury. Symptoms may include:  Red or  dark areas of the skin.  Pain, warmth, or a change of skin texture.  Blisters.  An open wound. How is this diagnosed? This condition is diagnosed with a medical history and physical exam. You may also have tests, such as:  Blood tests.  Imaging tests.  Blood flow tests. Your pressure injury will be staged based on its severity. Staging is based on:  The depth of the tissue injury, including whether there is exposure of muscle, bone, or tendon.  The cause of the pressure injury. How is this treated? This condition may be treated by:  Relieving or redistributing pressure on your skin. This includes: ? Frequently changing your position. ? Avoiding positions that caused the wound or that can make the wound worse. ? Using specific bed mattresses, chair cushions, or protective boots. ? Moving medical devices from an area of pressure, or placing padding between the skin and the device. ? Using foams, creams, or powders to prevent rubbing (friction) on the skin.  Keeping your skin clean and dry. This may include using a skin cleanser or skin barrier as told by your health care provider.  Cleaning your injury and removing any dead tissue from the wound (debridement).  Placing a bandage (dressing) over your injury.  Using medicines for pain or to prevent or treat infection. Surgery may be needed if other treatments are not working or if your injury is very deep. Follow these instructions at home: Wound care  Follow instructions from your health care provider about how  to take care of your wound. Make sure you: ? Wash your hands with soap and water before and after you change your bandage (dressing). If soap and water are not available, use hand sanitizer. ? Change your dressing as told by your health care provider.  Check your wound every day for signs of infection. Have a caregiver do this for you if you are not able. Check for: ? Redness, swelling, or increased pain. ? More  fluid or blood. ? Warmth. ? Pus or a bad smell. Skin care  Keep your skin clean and dry. Gently pat your skin dry.  Do not rub or massage your skin.  You or a caregiver should check your skin every day for any changes in color or any new blisters or sores (ulcers). Medicines  Take over-the-counter and prescription medicines only as told by your health care provider.  If you were prescribed an antibiotic medicine, take or apply it as told by your health care provider. Do not stop using the antibiotic even if your condition improves. Reducing and redistributing pressure  Do not lie or sit in one position for a long time. Move or change position every 1-2 hours, or as told by your health care provider.  Use pillows or cushions to reduce pressure. Ask your health care provider to recommend cushions or pads for you. General instructions   Eat a healthy diet that includes lots of protein.  Drink enough fluid to keep your urine pale yellow.  Be as active as you can every day. Ask your health care provider to suggest safe exercises or activities.  Do not abuse drugs or alcohol.  Do not use any products that contain nicotine or tobacco, such as cigarettes, e-cigarettes, and chewing tobacco. If you need help quitting, ask your health care provider.  Keep all follow-up visits as told by your health care provider. This is important. Contact a health care provider if:  You have: ? A fever or chills. ? Pain that is not helped by medicine. ? Any changes in skin color. ? New blisters or sores. ? Pus or a bad smell coming from your wound. ? Redness, swelling, or pain around your wound. ? More fluid or blood coming from your wound.  Your wound does not improve after 1-2 weeks of treatment. Summary  A pressure injury is damage to the skin and underlying tissue that results from pressure being applied to an area of the body.  Do not lie or sit in one position for a long time. Your  health care provider may advise you to move or change position every 1-2 hours.  Follow instructions from your health care provider about how to take care of your wound.  Keep all follow-up visits as told by your health care provider. This is important. This information is not intended to replace advice given to you by your health care provider. Make sure you discuss any questions you have with your health care provider. Document Revised: 04/09/2018 Document Reviewed: 04/09/2018 Elsevier Patient Education  [place].  Edema  Edema is an abnormal buildup of fluids in the body tissues and under the skin. Swelling of the legs, feet, and ankles is a common symptom that becomes more likely as you get older. Swelling is also common in looser tissues, like around the eyes. When the affected area is squeezed, the fluid may move out of that spot and leave a dent for a few moments. This dent is called pitting edema. There  are many possible causes of edema. Eating too much salt (sodium) and being on your feet or sitting for a long time can cause edema in your legs, feet, and ankles. Hot weather may make edema worse. Common causes of edema include:  Heart failure.  Liver or kidney disease.  Weak leg blood vessels.  Cancer.  An injury.  Pregnancy.  Medicines.  Being obese.  Low protein levels in the blood. Edema is usually painless. Your skin may look swollen or shiny. Follow these instructions at home:  Keep the affected body part raised (elevated) above the level of your heart when you are sitting or lying down.  Do not sit still or stand for long periods of time.  Do not wear tight clothing. Do not wear garters on your upper legs.  Exercise your legs to get your circulation going. This helps to move the fluid back into your blood vessels, and it may help the swelling go down.  Wear elastic bandages or support stockings to reduce swelling as told by your health care  provider.  Eat a low-salt (low-sodium) diet to reduce fluid as told by your health care provider.  Depending on the cause of your swelling, you may need to limit how much fluid you drink (fluid restriction).  Take over-the-counter and prescription medicines only as told by your health care provider. Contact a health care provider if:  Your edema does not get better with treatment.  You have heart, liver, or kidney disease and have symptoms of edema.  You have sudden and unexplained weight gain. Get help right away if:  You develop shortness of breath or chest pain.  You cannot breathe when you lie down.  You develop pain, redness, or warmth in the swollen areas.  You have heart, liver, or kidney disease and suddenly get edema.  You have a fever and your symptoms suddenly get worse. Summary  Edema is an abnormal buildup of fluids in the body tissues and under the skin.  Eating too much salt (sodium) and being on your feet or sitting for a long time can cause edema in your legs, feet, and ankles.  Keep the affected body part raised (elevated) above the level of your heart when you are sitting or lying down. This information is not intended to replace advice given to you by your health care provider. Make sure you discuss any questions you have with your health care provider. Document Revised: 01/28/2019 Document Reviewed: 10/13/2016 Elsevier Patient Education  2020 [place].  Chronic Venous Insufficiency Chronic venous insufficiency is a condition where the leg veins cannot effectively pump blood from the legs to the heart. This happens when the vein walls are either stretched, weakened, or damaged, or when the valves inside the vein are damaged. With the right treatment, you should be able to continue with an active life. This condition is also called venous stasis. What are the causes? Common causes of this condition include:  High blood pressure inside the veins (venous  hypertension).  Sitting or standing too long, causing increased blood pressure in the leg veins.  A blood clot that blocks blood flow in a vein (deep vein thrombosis, DVT).  Inflammation of a vein (phlebitis) that causes a blood clot to form.  Tumors in the pelvis that cause blood to back up. What increases the risk? The following factors may make you more likely to develop this condition:  Having a family history of this condition.  Obesity.  Pregnancy.  Living  without enough regular physical activity or exercise (sedentary lifestyle).  Smoking.  Having a job that requires long periods of standing or sitting in one place.  Being a certain age. Women in their 68s and 38s and men in their 74s are more likely to develop this condition. What are the signs or symptoms? Symptoms of this condition include:  Veins that are enlarged, bulging, or twisted (varicose veins).  Skin breakdown or ulcers.  Reddened skin or dark discoloration of skin on the leg between the knee and ankle.  Brown, smooth, tight, and painful skin just above the ankle, usually on the inside of the leg (lipodermatosclerosis).  Swelling of the legs. How is this diagnosed? This condition may be diagnosed based on:  Your medical history.  A physical exam.  Tests, such as: ? A procedure that creates an image of a blood vessel and nearby organs and provides information about blood flow through the blood vessel (duplex ultrasound). ? A procedure that tests blood flow (plethysmography). ? A procedure that looks at the veins using X-ray and dye (venogram). How is this treated? The goals of treatment are to help you return to an active life and to minimize pain or disability. Treatment depends on the severity of your condition, and it may include:  Wearing compression stockings. These can help relieve symptoms and help prevent your condition from getting worse. However, they do not cure the  condition.  Sclerotherapy. This procedure involves an injection of a solution that shrinks damaged veins.  Surgery. This may involve: ? Removing a diseased vein (vein stripping). ? Cutting off blood flow through the vein (laser ablation surgery). ? Repairing or reconstructing a valve within the affected vein. Follow these instructions at home:      Wear compression stockings as told by your health care provider. These stockings help to prevent blood clots and reduce swelling in your legs.  Take over-the-counter and prescription medicines only as told by your health care provider.  Stay active by exercising, walking, or doing different activities. Ask your health care provider what activities are safe for you and how much exercise you need.  Drink enough fluid to keep your urine pale yellow.  Do not use any products that contain nicotine or tobacco, such as cigarettes, e-cigarettes, and chewing tobacco. If you need help quitting, ask your health care provider.  Keep all follow-up visits as told by your health care provider. This is important. Contact a health care provider if you:  Have redness, swelling, or more pain in the affected area.  See a red streak or line that goes up or down from the affected area.  Have skin breakdown or skin loss in the affected area, even if the breakdown is small.  Get an injury in the affected area. Get help right away if:  You get an injury and an open wound in the affected area.  You have: ? Severe pain that does not get better with medicine. ? Sudden numbness or weakness in the foot or ankle below the affected area. ? Trouble moving your foot or ankle. ? A fever. ? Worse or persistent symptoms. ? Chest pain. ? Shortness of breath. Summary  Chronic venous insufficiency is a condition where the leg veins cannot effectively pump blood from the legs to the heart.  Chronic venous insufficiency occurs when the vein walls become stretched,  weakened, or damaged, or when valves within the vein are damaged.  Treatment depends on how severe your condition is. It  often involves wearing compression stockings and may involve having a procedure.  Make sure you stay active by exercising, walking, or doing different activities. Ask your health care provider what activities are safe for you and how much exercise you need. This information is not intended to replace advice given to you by your health care provider. Make sure you discuss any questions you have with your health care provider. Document Revised: 06/03/2018 Document Reviewed: 06/03/2018 Elsevier Patient Education  [place].

## 2019-10-26 NOTE — Progress Notes (Signed)
Subjective: Laura Atkins presents today for follow up of preventative diabetic foot care, follow up right heel pain and painful mycotic nails b/l that are difficult to trim. Pain interferes with ambulation. Aggravating factors include wearing enclosed shoe gear. Pain is relieved with periodic professional debridement.   She states she continues to have heel pain right heel and it starts hurting every morning about 4 a.m. She does admit sleeping on her back. She has not been wearing her heel protector consistently stating "every now and again".   Allergies  Allergen Reactions  . Penicillins Shortness Of Breath, Itching and Rash  . Prednisone Shortness Of Breath, Itching and Rash  . Propoxyphene N-Acetaminophen Itching and Nausea And Vomiting     Objective: There were no vitals filed for this visit.  Vascular Examination:  Capillary refill time to digits immediate b/l, faintly palpable DP pulses b/l, non-palpable PT pulses b/l, pedal hair absent b/l, skin temperature gradient within normal limits b/l, evidence of chronic venous insufficiency b/l LE, nonpitting edema noted b/l LE and no pain with calf compression b/l  Dermatological Examination: Pedal skin with normal turgor, texture and tone bilaterally, no open wounds bilaterally, no interdigital macerations bilaterally, toenails 1-5 b/l elongated, dystrophic, thickened, crumbly with subungual debris and Skin discoloration consistent with findings of of chronic venous insufficiency b/l LE: hyperpigmentation  Musculoskeletal: Normal muscle strength 5/5 to all lower extremity muscle groups bilaterally, no pain crepitus or joint limitation noted with ROM b/l and tenderness to posteromedial heel. No open wound. +Postinflammatory hyperpigmentation. No flocculence.  Neurological: Protective sensation intact 5/5 intact bilaterally with 10g monofilament b/l and vibratory sensation intact b/l  Assessment: 1. Pain due to onychomycosis of toenail    2. Heel pain, chronic, right   3. Venous (peripheral) insufficiency   4. Type 2 diabetes mellitus with hyperglycemia, with long-term current use of insulin (HCC)      Plan: -Discussed decubitus ulceration as related to pressure encountered in bed as she does not turn during the night. She has one heel protector and an additional heel protector dispensed for left heel.  Advised her to strictly adhere to using her heel protectors whenever in bed or recliner  to avoid pressure to heels. Instructions written for her today. -Rx written to [facility] for two pair of compression hose 20-30 mmHg, level knee high. Requested patient's legs be measured. -Continue diabetic foot care principles. Literature dispensed on today.  -Toenails 1-5 b/l were debrided in length and girth without iatrogenic bleeding. -Patient to continue soft, supportive shoe gear daily. -Patient to report any pedal injuries to medical professional immediately. -Patient/POA to call should there be question/concern in the interim.  Return in about 10 weeks (around 01/04/2020) for diabetic nail and callus trim.

## 2019-11-02 ENCOUNTER — Other Ambulatory Visit: Payer: Self-pay

## 2019-11-02 ENCOUNTER — Ambulatory Visit (INDEPENDENT_AMBULATORY_CARE_PROVIDER_SITE_OTHER): Payer: Medicare HMO | Admitting: Family Medicine

## 2019-11-02 ENCOUNTER — Other Ambulatory Visit: Payer: Self-pay | Admitting: *Deleted

## 2019-11-02 ENCOUNTER — Ambulatory Visit: Payer: Medicare HMO | Admitting: Family Medicine

## 2019-11-02 ENCOUNTER — Encounter: Payer: Self-pay | Admitting: Family Medicine

## 2019-11-02 VITALS — BP 146/58 | Ht 68.0 in | Wt 272.0 lb

## 2019-11-02 DIAGNOSIS — I10 Essential (primary) hypertension: Secondary | ICD-10-CM

## 2019-11-02 DIAGNOSIS — Z794 Long term (current) use of insulin: Secondary | ICD-10-CM | POA: Diagnosis not present

## 2019-11-02 DIAGNOSIS — E7849 Other hyperlipidemia: Secondary | ICD-10-CM

## 2019-11-02 DIAGNOSIS — F419 Anxiety disorder, unspecified: Secondary | ICD-10-CM

## 2019-11-02 DIAGNOSIS — E1165 Type 2 diabetes mellitus with hyperglycemia: Secondary | ICD-10-CM

## 2019-11-02 DIAGNOSIS — F329 Major depressive disorder, single episode, unspecified: Secondary | ICD-10-CM

## 2019-11-02 DIAGNOSIS — E559 Vitamin D deficiency, unspecified: Secondary | ICD-10-CM

## 2019-11-02 DIAGNOSIS — F32A Depression, unspecified: Secondary | ICD-10-CM

## 2019-11-02 NOTE — Patient Instructions (Addendum)
Annual physical exam in office in next 4 to 6 weeks, call if you need me sooner, with foot exam  Please schedule mammogram late morning  Appointment preferred , past due  Please schedule eye exam with Dr Katy Fitch, she is referred    Please schedule fasting lipid, cmp and EGF, CBC, TSH and vit D 1 week before appointment for her annual exam  Think about what you will eat, plan ahead. Choose " clean, green, fresh or frozen" over canned, processed or packaged foods which are more sugary, salty and fatty. 70 to 75% of food eaten should be vegetables and fruit. Three meals at set times with snacks allowed between meals, but they must be fruit or vegetables. Aim to eat over a 12 hour period , example 7 am to 7 pm, and STOP after  your last meal of the day. Drink water,generally about 64 ounces per day, no other drink is as healthy. Fruit juice is best enjoyed in a healthy way, by EATING the fruit.  Continue to practice home safety , so that you do not fall   Thanks for choosing [facility], we consider it a privelige to serve you.

## 2019-11-02 NOTE — Assessment & Plan Note (Signed)
Uncontrolled , but improved , managed by [name] is reminded of the importance of commitment to daily physical activity for 30 minutes or more, as able and the need to limit carbohydrate intake to 30 to 60 grams per meal to help with blood sugar control.   The need to take medication as prescribed, test blood sugar as directed, and to call between visits if there is a concern that blood sugar is uncontrolled is also discussed.   Laura Atkins is reminded of the importance of daily foot exam, annual eye examination, and good blood sugar, blood pressure and cholesterol control.  Diabetic Labs Latest Ref Rng & Units 06/02/2019 03/31/2019 11/26/2018 08/14/2018 04/11/2018  HbA1c 4.0 - 5.6 % 8.1(A) 9.3(A) 8.5(H) 8.6(A) 7.7(A)  Microalbumin mg/dL - - 33.9 - -  Micro/Creat Ratio 0.0 - 30.0 mg/g creat - - - - -  Chol <200 mg/dL - - 171 - -  HDL > OR = 50 mg/dL - - 60 - -  Calc LDL mg/dL (calc) - - 90 - -  Triglycerides <150 mg/dL - - 118 - -  Creatinine 0.60 - 0.93 mg/dL - - 0.79 - -   BP/Weight 11/02/2019 07/22/2019 06/02/2019 05/13/2019 04/23/2019 [id number]  Systolic BP [id number] [id number] [id number]  Diastolic BP 58 58 58 72 61 59 54  Wt. (Lbs) 272 272 272.2 260.12 273.08 274 273.8  BMI 41.36 41.36 41.39 39.55 41.52 41.66 40.43  Some encounter information is confidential and restricted. Go to Review Flowsheets activity to see all data.   Foot/eye exam completion dates Latest Ref Rng & Units 08/14/2018 04/11/2018  Eye Exam No Retinopathy - -  Foot Form Completion - Done Done

## 2019-11-02 NOTE — Progress Notes (Signed)
Virtual Visit via Telephone Note  I connected with Laura Atkins on 11/02/19 at  1:20 PM EST by telephone and verified that I am speaking with the correct person using two identifiers.  Location: Patient: home Provider: office   I discussed the limitations, risks, security and privacy concerns of performing an evaluation and management service by telephone and the availability of in person appointments. I also discussed with the patient that there may be a patient responsible charge related to this service. The patient expressed understanding and agreed to proceed.   History of Present Illness: F/U chronic problems, medication review, and refill medication when necessary. Review most recent labs and order labs which are due Review preventive health and update with necessary referrals or immunizations as indicated Mammogram is overdue, she is concerned about skin changes over her left breast where she had surgery years ago Denies recent fever or chills. Denies sinus pressure, nasal congestion, ear pain or sore throat. Denies chest congestion, productive cough or wheezing. Denies chest pains, palpitations and leg swelling Denies abdominal pain, nausea, vomiting,diarrhea or constipation.   Denies dysuria, frequency, hesitancy or incontinence. C/o chronic joint pain, swelling and limitation in mobility. Denies headaches, seizures, numbness, or tingling. Denies uncontrolled  depression, anxiety or insomnia. Denies skin break down or rash.       Observations/Objective:  BP (!) 146/58   Ht 5\' 8"  (1.727 m)   Wt 272 lb (123.4 kg)   BMI 41.36 kg/m  Good communication with no confusion and intact memory. Alert and oriented x 3 No signs of respiratory distress during speech  Assessment and Plan: Type 2 diabetes mellitus with hyperglycemia (HCC) Uncontrolled , but improved , managed by [name] is reminded of the importance of commitment to daily physical activity for 30  minutes or more, as able and the need to limit carbohydrate intake to 30 to 60 grams per meal to help with blood sugar control.   The need to take medication as prescribed, test blood sugar as directed, and to call between visits if there is a concern that blood sugar is uncontrolled is also discussed.   Laura Atkins is reminded of the importance of daily foot exam, annual eye examination, and good blood sugar, blood pressure and cholesterol control.  Diabetic Labs Latest Ref Rng & Units 06/02/2019 03/31/2019 11/26/2018 08/14/2018 04/11/2018  HbA1c 4.0 - 5.6 % 8.1(A) 9.3(A) 8.5(H) 8.6(A) 7.7(A)  Microalbumin mg/dL - - 33.9 - -  Micro/Creat Ratio 0.0 - 30.0 mg/g creat - - - - -  Chol <200 mg/dL - - 171 - -  HDL > OR = 50 mg/dL - - 60 - -  Calc LDL mg/dL (calc) - - 90 - -  Triglycerides <150 mg/dL - - 118 - -  Creatinine 0.60 - 0.93 mg/dL - - 0.79 - -   BP/Weight 11/02/2019 07/22/2019 06/02/2019 05/13/2019 04/23/2019 [id number]  Systolic BP 123456 123456 [id number] 0000000 [id number]  Diastolic BP 58 58 58 72 61 59 54  Wt. (Lbs) 272 272 272.2 260.12 273.08 274 273.8  BMI 41.36 41.36 41.39 39.55 41.52 41.66 40.43  Some encounter information is confidential and restricted. Go to Review Flowsheets activity to see all data.   Foot/eye exam completion dates Latest Ref Rng & Units 08/14/2018 04/11/2018  Eye Exam No Retinopathy - -  Foot Form Completion - Done Done        Essential hypertension Elevated per record review will re eval in office, no med change  at this time DASH diet and commitment to daily physical activity for a minimum of 30 minutes discussed and encouraged, as a part of hypertension management. The importance of attaining a healthy weight is also discussed.  BP/Weight 11/02/2019 07/22/2019 06/02/2019 05/13/2019 04/23/2019 [id number]  Systolic BP 123456 123456 [id number] 0000000 [id number]  Diastolic BP 58 58 58 72 61 59 54  Wt. (Lbs) 272 272 272.2 260.12 273.08 274 273.8  BMI 41.36 41.36 41.39 39.55 41.52  41.66 40.43  Some encounter information is confidential and restricted. Go to Review Flowsheets activity to see all data.       Anxiety and depression Managed by Psych and controlled and stable  Hyperlipemia Hyperlipidemia:Low fat diet discussed and encouraged.   Lipid Panel  Lab Results  Component Value Date   CHOL 171 11/26/2018   HDL 60 11/26/2018   LDLCALC 90 11/26/2018   TRIG 118 11/26/2018   CHOLHDL 2.9 11/26/2018     Updated lab needed at/ before next visit.     Follow Up Instructions:    I discussed the assessment and treatment plan with the patient. The patient was provided an opportunity to ask questions and all were answered. The patient agreed with the plan and demonstrated an understanding of the instructions.   The patient was advised to call back or seek an in-person evaluation if the symptoms worsen or if the condition fails to improve as anticipated.  I provided 22 minutes of non-face-to-face time during this encounter.   Tula Nakayama, MD

## 2019-11-04 ENCOUNTER — Other Ambulatory Visit: Payer: Self-pay

## 2019-11-04 NOTE — Assessment & Plan Note (Signed)
Hyperlipidemia:Low fat diet discussed and encouraged.   Lipid Panel  Lab Results  Component Value Date   CHOL 171 11/26/2018   HDL 60 11/26/2018   LDLCALC 90 11/26/2018   TRIG 118 11/26/2018   CHOLHDL 2.9 11/26/2018     Updated lab needed at/ before next visit.

## 2019-11-04 NOTE — Assessment & Plan Note (Signed)
Managed by Psych and controlled and stable

## 2019-11-04 NOTE — Assessment & Plan Note (Signed)
Elevated per record review will re eval in office, no med change at this time DASH diet and commitment to daily physical activity for a minimum of 30 minutes discussed and encouraged, as a part of hypertension management. The importance of attaining a healthy weight is also discussed.  BP/Weight 11/02/2019 07/22/2019 06/02/2019 05/13/2019 04/23/2019 [id number]  Systolic BP 123456 123456 123456 [id number] 0000000 [id number]  Diastolic BP 58 58 58 72 61 59 54  Wt. (Lbs) 272 272 272.2 260.12 273.08 274 273.8  BMI 41.36 41.36 41.39 39.55 41.52 41.66 40.43  Some encounter information is confidential and restricted. Go to Review Flowsheets activity to see all data.

## 2019-11-06 ENCOUNTER — Ambulatory Visit: Payer: Medicare HMO | Admitting: Endocrinology

## 2019-11-06 ENCOUNTER — Encounter: Payer: Self-pay | Admitting: Endocrinology

## 2019-11-06 ENCOUNTER — Other Ambulatory Visit: Payer: Self-pay

## 2019-11-06 VITALS — BP 132/64 | HR 85 | Ht 68.0 in | Wt 281.8 lb

## 2019-11-06 DIAGNOSIS — Z794 Long term (current) use of insulin: Secondary | ICD-10-CM

## 2019-11-06 DIAGNOSIS — E1165 Type 2 diabetes mellitus with hyperglycemia: Secondary | ICD-10-CM | POA: Diagnosis not present

## 2019-11-06 LAB — POCT GLYCOSYLATED HEMOGLOBIN (HGB A1C): Hemoglobin A1C: 9.8 % — AB (ref 4.0–5.6)

## 2019-11-06 MED ORDER — NOVOLIN 70/30 (70-30) 100 UNIT/ML ~~LOC~~ SUSP: 70.0000 [IU] | Freq: Every day | SUBCUTANEOUS | 11 refills | Status: DC

## 2019-11-06 NOTE — Patient Instructions (Addendum)
check your blood sugar twice a day.  vary the time of day when you check, between before the 3 meals, and at bedtime.  also check if you have symptoms of your blood sugar being too high or too low.  please keep a record of the readings and bring it to your next appointment here (or you can bring the meter itself).  You can write it on any piece of paper.  please call us sooner if your blood sugar goes below 70, or if you have a lot of readings over 200. Please increase the insulin to 70 units with breakfast, no matter what the blood sugar is.   On this type of insulin schedule, you should eat meals on a regular schedule.  If a meal is missed or significantly delayed, your blood sugar could go low.   Please come back for a follow-up appointment in 1 month.

## 2019-11-06 NOTE — Progress Notes (Signed)
Subjective:    Patient ID: Laura Atkins, female    DOB: April 18, 1941, 79 y.o.   MRN: [id number]  HPI Pt returns for f/u of diabetes mellitus: DM type: insulin-requiring type 2.   Dx'ed: 0737 Complications: polyneuropathy, PAD, and foot ulcer.   Therapy: insulin since 2005.   GDM: never.  DKA: never Severe hypoglycemia: last episode was 2020.    Pancreatitis: never.   SDOH: She takes human insulin, due to cost.   Other info: in early 2016, she changed to BID premixed insulin, due to poor results with multiple daily injections.   Interval history: pt says she seldom misses the insulin, but she takes just 60 units qam, due to fear of hypoglycemia.  pt states she feels well in general.  I reviewed continuous glucose monitor data.  Glucose varies from 170-300.  It is in general lowest fasting.   Past Medical History:  Diagnosis Date  . Alpha thalassemia trait 01/26/2010   02/2012: Nl CBC ex H&H-10.7/34.8, MCV-69   . Anemia   . Anxiety   . Anxiety and depression   . Cellulitis 05/09/2017  . Depression   . Diabetes mellitus   . GERD (gastroesophageal reflux disease)   . Headache(784.0)   . Hyperlipidemia   . Hypertension   . Iron deficiency 01/21/2017  . Microcytic anemia 01/26/2010   02/2012: Nl CBC ex H&H-10.7/34.8, MCV-69   . Obesity   . Obstructive sleep apnea   . Osteoarthritis    Left knee; right shoulder; chronic neck and back pain  . Pruritus   . Seizures ([place])   . Tremor    This started months ago after her seizure progressing to very poor hand writing  . Urinary incontinence   . UTI (urinary tract infection) 01/18/2013    Past Surgical History:  Procedure Laterality Date  . ABDOMINAL HYSTERECTOMY    . BREAST EXCISIONAL BIOPSY     Left; cyst  . CATARACT EXTRACTION Right    12/2017  . CATARACT EXTRACTION W/ INTRAOCULAR LENS IMPLANT Left 09/07/2013  . CHOLECYSTECTOMY    . COLONOSCOPY    . COLONOSCOPY N/A 07/20/2015   Procedure: COLONOSCOPY;  Surgeon: Rogene Houston,  MD;  Location: [facility];  Service: Endoscopy;  Laterality: N/A;  930  . EYE SURGERY Left 09/07/2013   cataract    Social History   Socioeconomic History  . Marital status: Married    Spouse name: saunders  . Number of children: 5  . Years of education: 64  . Highest education level: Not on file  Occupational History  . Occupation: Disabled     Employer: RETIRED  Tobacco Use  . Smoking status: Never Smoker  . Smokeless tobacco: Never Used  Substance and Sexual Activity  . Alcohol use: No    Alcohol/week: 0.0 standard drinks  . Drug use: No  . Sexual activity: Yes    Birth control/protection: Surgical  Other Topics Concern  . Not on file  Social History Narrative   Patient lives at home with her husband [name].  Patient is retired.    Right handed.    Five Children.    Caffeine- 2 daily   Social Determinants of Health   Financial Resource Strain:   . Difficulty of Paying Living Expenses: Not on file  Food Insecurity:   . Worried About Charity fundraiser in the Last Year: Not on file  . Ran Out of Food in the Last Year: Not on file  Transportation Needs:   .  Lack of Transportation (Medical): Not on file  . Lack of Transportation (Non-Medical): Not on file  Physical Activity:   . Days of Exercise per Week: Not on file  . Minutes of Exercise per Session: Not on file  Stress:   . Feeling of Stress : Not on file  Social Connections:   . Frequency of Communication with Friends and Family: Not on file  . Frequency of Social Gatherings with Friends and Family: Not on file  . Attends Religious Services: Not on file  . Active Member of Clubs or Organizations: Not on file  . Attends Archivist Meetings: Not on file  . Marital Status: Not on file  Intimate Partner Violence:   . Fear of Current or Ex-Partner: Not on file  . Emotionally Abused: Not on file  . Physically Abused: Not on file  . Sexually Abused: Not on file    Current Outpatient  Medications on File Prior to Visit  Medication Sig Dispense Refill  . Accu-Chek FastClix Lancets MISC 1 each by Does not apply route 4 (four) times daily. Use to monitor glucose levels 4 times daily; E11.65 306 each 2  . acetaminophen (EXTRA STRENGTH PAIN RELIEF) 500 MG tablet Take 500 mg by mouth every 6 (six) hours as needed for pain.    . Alcohol Swabs (B-D SINGLE USE SWABS REGULAR) PADS 1 each by Does not apply route 4 (four) times daily. Use to prep site for glucose monitoring 4 times daily; E11.65 300 each 2  . amLODipine (NORVASC) 10 MG tablet TAKE 1 TABLET BY MOUTH EVERY DAY 30 tablet 2  . aspirin 81 MG tablet Take 81 mg by mouth daily.      . ASPIRIN LOW DOSE 81 MG EC tablet Take 81 mg by mouth daily.    Marland Kitchen atorvastatin (LIPITOR) 40 MG tablet TAKE 1 TABLET BY MOUTH EVERY DAY 30 tablet 2  . benazepril (LOTENSIN) 20 MG tablet TAKE 1 TABLET BY MOUTH EVERY DAY 30 tablet 2  . Blood Glucose Monitoring Suppl (ACCU-CHEK GUIDE ME) w/Device KIT 1 each by Does not apply route 4 (four) times daily. Use to monitor glucose levels 4 times daily; E11.65 1 kit 0  . buPROPion (WELLBUTRIN XL) 150 MG 24 hr tablet Take 1 tablet (150 mg total) by mouth every morning. 30 tablet 5  . Continuous Blood Gluc Receiver (FREESTYLE LIBRE 14 DAY READER) DEVI USE AS DIRECTED FOR ONE DOSE 1 each 0  . Continuous Blood Gluc Sensor (FREESTYLE LIBRE 14 DAY SENSOR) MISC 1 Device by Does not apply route every 14 (fourteen) days. 6 each 3  . diclofenac (VOLTAREN) 75 MG EC tablet Take 1 tablet (75 mg total) by mouth 2 (two) times daily. 30 tablet 0  . gabapentin (NEURONTIN) 400 MG capsule TAKE ONE CAPSULE BY MOUTH EVERY MORNING and TAKE ONE CAPSULE AT NOON and TAKE TWO CAPSULES AT BEDTIME 360 capsule 1  . gentamicin cream (GARAMYCIN) 0.1 % Apply 1 application topically 2 (two) times daily. 30 g 1  . glucose blood (ACCU-CHEK GUIDE) test strip 1 each by Other route 4 (four) times daily. Use to monitor glucose levels 4 times daily;  E11.65 400 each 2  . hydrOXYzine (ATARAX/VISTARIL) 10 MG tablet Take one tablet at bedtime as needed, for itching 30 tablet 0  . Insulin Syringe-Needle U-100 (INSULIN SYRINGE 1CC/31GX5/16") 31G X 5/16" 1 ML MISC 1 each by Does not apply route daily. Use to inject insulin daily; E11.65 90 each 2  . Multiple  Vitamin (MULTIVITAMIN) capsule Take 1 capsule by mouth daily.    . mupirocin ointment (BACTROBAN) 2 % Apply to right heel once daily 22 g 1  . omeprazole (PRILOSEC) 20 MG capsule TAKE ONE CAPSULE BY MOUTH EVERY DAY 30 capsule 2  . potassium chloride SA (K-DUR) 20 MEQ tablet TAKE 1 TABLET BY MOUTH TWICE DAILY 180 tablet 1  . sertraline (ZOLOFT) 100 MG tablet Take 1 tablet (100 mg total) by mouth daily. 60 tablet 5  . torsemide (DEMADEX) 20 MG tablet TAKE TWO TABLETS BY MOUTH EVERY DAY (STOP lasix) 180 tablet 3  . UNABLE TO FIND Walker x 1  DX unsteady gait, osteoarthritis of left knee 1 each 0  . UNABLE TO FIND Elevated [place] Seat x 1 DX: unsteady gait, back pain, osteoarthritis of left knee 1 each 0   No current facility-administered medications on file prior to visit.    Allergies  Allergen Reactions  . Penicillins Shortness Of Breath, Itching and Rash  . Prednisone Shortness Of Breath, Itching and Rash  . Propoxyphene N-Acetaminophen Itching and Nausea And Vomiting    Family History  Problem Relation Age of Onset  . Lung cancer Mother 49  . Kidney disease Father   . Diabetes Sister   . Keloids Brother   . ADD / ADHD Grandchild   . Bipolar disorder Grandchild   . Bipolar disorder Daughter   . Seizures Daughter   . Heart disease Daughter   . Kidney disease Son   . Neuropathy Son   . Kidney disease Son   . Edema Daughter   . Breast cancer Daughter 92  . Allergies Daughter   . Alcohol abuse Neg Hx   . Drug abuse Neg Hx     BP 132/64 (BP Location: Right Arm, Patient Position: Sitting, Cuff Size: Large)   Pulse 85   Ht 5' 8"  (1.727 m)   Wt 281 lb 12.8 oz (127.8 kg)    SpO2 97%   BMI 42.85 kg/m   Review of Systems She denies hypoglycemia.      Objective:   Physical Exam VITAL SIGNS:  See vs page GENERAL: no distress Pulses: dorsalis pedis intact bilat.   MSK: no deformity of the feet CV: 2+ bilat bilat leg edema Skin:  no ulcer on the feet.  normal temp on the feet.  There is hyperpigmentation of the legs and feet. Neuro: sensation is intact to touch on the feet, but decreased from normal. Ext: there is bilateral onychomycosis of the toenails  Lab Results  Component Value Date   HGBA1C 9.8 (A) 11/06/2019       Assessment & Plan:  Insulin-requiring type 2 DM, with PAD: worse.  Noncompliance with insulin dosing.  We discussed the need for anticipatory dosing.    Patient Instructions  check your blood sugar twice a day.  vary the time of day when you check, between before the 3 meals, and at bedtime.  also check if you have symptoms of your blood sugar being too high or too low.  please keep a record of the readings and bring it to your next appointment here (or you can bring the meter itself).  You can write it on any piece of paper.  please call us sooner if your blood sugar goes below 70, or if you have a lot of readings over 200. Please increase the insulin to 70 units with breakfast, no matter what the blood sugar is.   On this type of insulin schedule,  you should eat meals on a regular schedule.  If a meal is missed or significantly delayed, your blood sugar could go low.   Please come back for a follow-up appointment in 1 month.

## 2019-11-09 ENCOUNTER — Telehealth: Payer: Self-pay

## 2019-11-09 ENCOUNTER — Other Ambulatory Visit: Payer: Self-pay

## 2019-11-09 DIAGNOSIS — R300 Dysuria: Secondary | ICD-10-CM | POA: Diagnosis not present

## 2019-11-09 DIAGNOSIS — R350 Frequency of micturition: Secondary | ICD-10-CM | POA: Diagnosis not present

## 2019-11-09 DIAGNOSIS — N390 Urinary tract infection, site not specified: Secondary | ICD-10-CM | POA: Diagnosis not present

## 2019-11-09 MED ORDER — BENAZEPRIL HCL 20 MG PO TABS: 20.0000 mg | ORAL_TABLET | Freq: Every day | ORAL | 5 refills | Status: DC

## 2019-11-09 NOTE — Telephone Encounter (Signed)
Received a voicemail from patients daughter that stated her mother had been to the pharmacy trying to get a refill of "kidney medication" Stated they had called Wed, thurs and fri and had not been able to speak with anybody to get a refill of the medication but no voicemails had been received requesting medications and they did NOT know the name of the medication and kept calling it the "kidney medication" and we were a bunch of idiots and she has begged her mother to find a new dr because we were sorry and she should report [place] to the medical board for failing to refill the med. Abby spoke with the daughter and she still could not provide a name of the medication and said she just knew her mother needed her kidney med because she was hurting. Abby stated that if she was talking about an antibiotic since no dr was in the office today that if she was hurting she needed to go to the ER.

## 2019-11-15 ENCOUNTER — Other Ambulatory Visit: Payer: Self-pay | Admitting: Endocrinology

## 2019-11-19 ENCOUNTER — Other Ambulatory Visit: Payer: Self-pay | Admitting: Family Medicine

## 2019-11-23 ENCOUNTER — Ambulatory Visit (HOSPITAL_COMMUNITY): Payer: Medicare HMO

## 2019-11-25 ENCOUNTER — Encounter: Payer: Self-pay | Admitting: Family Medicine

## 2019-11-25 DIAGNOSIS — E113293 Type 2 diabetes mellitus with mild nonproliferative diabetic retinopathy without macular edema, bilateral: Secondary | ICD-10-CM | POA: Diagnosis not present

## 2019-11-25 DIAGNOSIS — H26493 Other secondary cataract, bilateral: Secondary | ICD-10-CM | POA: Diagnosis not present

## 2019-11-25 DIAGNOSIS — Z961 Presence of intraocular lens: Secondary | ICD-10-CM | POA: Diagnosis not present

## 2019-11-25 LAB — HM DIABETES EYE EXAM

## 2019-12-01 DIAGNOSIS — E7849 Other hyperlipidemia: Secondary | ICD-10-CM | POA: Diagnosis not present

## 2019-12-01 DIAGNOSIS — D539 Nutritional anemia, unspecified: Secondary | ICD-10-CM | POA: Diagnosis not present

## 2019-12-01 DIAGNOSIS — E559 Vitamin D deficiency, unspecified: Secondary | ICD-10-CM | POA: Diagnosis not present

## 2019-12-01 DIAGNOSIS — E1165 Type 2 diabetes mellitus with hyperglycemia: Secondary | ICD-10-CM | POA: Diagnosis not present

## 2019-12-01 DIAGNOSIS — I10 Essential (primary) hypertension: Secondary | ICD-10-CM | POA: Diagnosis not present

## 2019-12-01 DIAGNOSIS — Z794 Long term (current) use of insulin: Secondary | ICD-10-CM | POA: Diagnosis not present

## 2019-12-02 ENCOUNTER — Ambulatory Visit (HOSPITAL_COMMUNITY): Payer: Medicare HMO

## 2019-12-04 ENCOUNTER — Ambulatory Visit: Payer: Medicare HMO | Admitting: Endocrinology

## 2019-12-07 ENCOUNTER — Other Ambulatory Visit: Payer: Self-pay

## 2019-12-07 ENCOUNTER — Ambulatory Visit (INDEPENDENT_AMBULATORY_CARE_PROVIDER_SITE_OTHER): Payer: Medicare HMO | Admitting: Podiatrist

## 2019-12-07 ENCOUNTER — Encounter: Payer: Self-pay | Admitting: Podiatrist

## 2019-12-07 VITALS — Temp 97.9°F

## 2019-12-07 DIAGNOSIS — I872 Venous insufficiency (chronic) (peripheral): Secondary | ICD-10-CM | POA: Diagnosis not present

## 2019-12-07 DIAGNOSIS — E1165 Type 2 diabetes mellitus with hyperglycemia: Secondary | ICD-10-CM | POA: Diagnosis not present

## 2019-12-07 DIAGNOSIS — S90822A Blister (nonthermal), left foot, initial encounter: Secondary | ICD-10-CM

## 2019-12-07 DIAGNOSIS — Z794 Long term (current) use of insulin: Secondary | ICD-10-CM

## 2019-12-07 MED ORDER — DOXYCYCLINE HYCLATE 100 MG PO TABS: 100.0000 mg | ORAL_TABLET | Freq: Two times a day (BID) | ORAL | 0 refills | Status: DC

## 2019-12-07 NOTE — Progress Notes (Signed)
  Chief Complaint  Patient presents with  . Blister    Left foot; ankle-medial side; pt stated, "blister does not hurt"; pt Diabetic Type 2; Sugar=did not check today; A1C=9.8 (11/06/19)     HPI: Patient is 79 y.o. female who presents today for a blister on the medial side of her left ankle.  Patient states it slowly been growing over the past 2 weeks and it is nonpainful.  Patient has compression stockings and states she has been unable to wear them consistently..  She presents today in a diabetic socks with croc shoes.  She denies any fevers, chills, nausea or vomiting.  Allergies  Allergen Reactions  . Penicillins Shortness Of Breath, Itching and Rash  . Prednisone Shortness Of Breath, Itching and Rash  . Propoxyphene N-Acetaminophen Itching and Nausea And Vomiting    Review of systems is reviewed and negative.   Physical Exam  Patient is awake, alert, and oriented x 3.  In no acute distress.    Vascular status is decreased with faintly palpable pedal pulses DP and nonpalpable PT left foot.  Her capillary refill time is less than 3 seconds left foot.  Moderate edema is noted left foot and leg-nonpitting edema present left.  Neurological exam reveals epicritic and protective sensation grossly intact left foot.  Dermatological exam reveals skin is taut and dry left with hyperpigmentation consistent with long-term edema..  A large 2.5 cm bullae is present on left ankle just distal  to the medial malleolus.  A secondary small blister is present on the medial instep of the foot.  It measures 7 mm x 4 mm.  No redness, no pus, no pain, no streaking no lymphangitis are noted.Fluid from the blister was clear.   Musculoskeletal exam: Musculature intact with dorsiflexion, plantarflexion, inversion, eversion. Ankle and First MPJ joint range of motion normal.     Assessment: 1. Blister left ankle 2. Chronic venous insufficiency 3. Type 2 diabetes  Plan: Discussed treatment options and  alternatives with the patient.  Because of the large size of the blister I opted to lance the lesion.  The patient agreed.  The blister was prepped with a Betadine solution and utilizing a sterile #15 blade a very small lancing of the lesion was performed at the 12:00 orientation of the bulla.  Clear fluid was completely expressed from the blister.  A Betadine dressing was applied followed on Ace wrap for compression.  The patient was given instructions for keeping the area clean with Betadine and a compressive Ace wrap.  Due to the potential of infection of the blister I did start her on doxycycline as well.  This was called into her pharmacy.  She will return in 1 week and will call if any pain, redness, swelling, drainage, streaking or signs of infection arise.

## 2019-12-07 NOTE — Patient Instructions (Signed)
Please purchase provodine/iodine at the drug store-  Apply to a gauze pad daily and apply to the blister area.  Cover with a gauze pad and keep in place with an ace wrap.    If you notice any redness, drainage, weeping or odor- call our office immediately as these are signs of infection and need to be seen asap

## 2019-12-08 ENCOUNTER — Encounter: Payer: Self-pay | Admitting: Family Medicine

## 2019-12-08 ENCOUNTER — Encounter: Payer: Self-pay | Admitting: Podiatrist

## 2019-12-08 ENCOUNTER — Ambulatory Visit (INDEPENDENT_AMBULATORY_CARE_PROVIDER_SITE_OTHER): Payer: Medicare HMO | Admitting: Family Medicine

## 2019-12-08 VITALS — BP 136/64 | HR 84 | Temp 98.1°F | Resp 16 | Ht 68.0 in | Wt 277.0 lb

## 2019-12-08 DIAGNOSIS — E785 Hyperlipidemia, unspecified: Secondary | ICD-10-CM

## 2019-12-08 DIAGNOSIS — E1165 Type 2 diabetes mellitus with hyperglycemia: Secondary | ICD-10-CM

## 2019-12-08 DIAGNOSIS — E1169 Type 2 diabetes mellitus with other specified complication: Secondary | ICD-10-CM

## 2019-12-08 DIAGNOSIS — R269 Unspecified abnormalities of gait and mobility: Secondary | ICD-10-CM

## 2019-12-08 DIAGNOSIS — Z0001 Encounter for general adult medical examination with abnormal findings: Secondary | ICD-10-CM | POA: Insufficient documentation

## 2019-12-08 DIAGNOSIS — Z794 Long term (current) use of insulin: Secondary | ICD-10-CM | POA: Diagnosis not present

## 2019-12-08 DIAGNOSIS — M1712 Unilateral primary osteoarthritis, left knee: Secondary | ICD-10-CM

## 2019-12-08 DIAGNOSIS — I10 Essential (primary) hypertension: Secondary | ICD-10-CM

## 2019-12-08 NOTE — Assessment & Plan Note (Signed)
Use of walker daily. Has demonstrated unsteady gait today in office. Encouraged to use walker and wear shoes when she is walking around the house.

## 2019-12-08 NOTE — Assessment & Plan Note (Signed)
Continue medications, encouraged low fat diet

## 2019-12-08 NOTE — Assessment & Plan Note (Signed)
Advised to use tylenol and OTC rubs to help with discomfort

## 2019-12-08 NOTE — Assessment & Plan Note (Signed)
Not controlled. She reports low Blood sugar at 5am. Reports that she spoke with Endocrinologist about this. Laura Atkins is encouraged to check blood sugar daily as directed. Continue current medications. Is on statin as well. Educated on importance of maintain a well balanced diabetic friendly diet. Foot exam performed today.  She is reminded the importance of maintaining  good blood sugars,  taking medications as directed, daily foot care, annual eye exams. Additionally educated about keeping good control over blood pressure and cholesterol as well.

## 2019-12-08 NOTE — Progress Notes (Signed)
Health Maintenance reviewed -   Immunization History  Administered Date(s) Administered  . Fluad Quad(high Dose 65+) 05/20/2019  . H1N1 07/15/2008  . Influenza Split 06/17/2012  . Influenza Whole 07/03/2007, 06/17/2009, 06/07/2011  . Influenza, High Dose Seasonal PF 07/17/2018  . Influenza,inj,Quad PF,6+ Mos 07/13/2013, 09/06/2014, 08/10/2015, 05/23/2016, 05/22/2017  . Pneumococcal Conjugate-13 05/03/2014  . Pneumococcal Polysaccharide-23 09/08/2010  . Td 09/08/2010  . Tdap 07/17/2018   Last Pap smear: n/a Last mammogram: 02/2018--needs to go, but is not driving herself Last colonoscopy: 2016 Last DEXA: 2008 Dentist: needs to get appt  Ophtho: March 2021   Exercise: not able to due to OA  Other doctors caring for patient include:  Patient Care Team: Fayrene Helper, MD as PCP - General Marcial Pacas, MD as Consulting Physician (Neurology) Renato Shin, MD as Consulting Physician (Endocrinology) Cloria Spring, MD as Consulting Physician ([place]) Alonza Smoker, LCSW as Social Worker (Psychiatry) Clent Jacks, MD as Consulting Physician (Ophthalmology)  End of Life Discussion:  Patient does not have a living will and medical power of attorney    Code Status: Prior   Subjective:   HPI  Laura Atkins is a 79 y.o. female who presents for annual wellness visit and follow-up on chronic medical conditions.  She has the following concerns: denies any outside concerns from last visit a month ago.  Review Of Systems  Review of Systems  Constitutional: Negative.   HENT: Negative.   Eyes: Negative.   Respiratory: Negative.   Cardiovascular: Negative.   Gastrointestinal: Negative.   Endocrine: Negative.   Genitourinary: Negative.   Musculoskeletal: Negative.   Skin: Negative.   Allergic/Immunologic: Negative.   Neurological: Negative.   Hematological: Negative.   Psychiatric/Behavioral: Negative.   All other systems reviewed and are  negative.   Objective:   PHYSICAL EXAM:  BP 136/64   Pulse 84   Temp 98.1 F (36.7 C) (Temporal)   Resp 16   Ht 5\' 8"  (1.727 m)   Wt 277 lb (125.6 kg)   SpO2 99%   BMI 42.12 kg/m    Physical Exam Vitals and nursing note reviewed.  Constitutional:      Appearance: Normal appearance. She is well-developed and well-groomed. She is obese.  HENT:     Head: Normocephalic.     Right Ear: Tympanic membrane normal.     Left Ear: Tympanic membrane normal.     Mouth/Throat:     Comments: Mask in place   Eyes:     Extraocular Movements: Extraocular movements intact.     Conjunctiva/sclera: Conjunctivae normal.     Pupils: Pupils are equal, round, and reactive to light.  Neck:     Thyroid: No thyroid mass, thyromegaly or thyroid tenderness.  Cardiovascular:     Rate and Rhythm: Normal rate and regular rhythm.     Pulses: Normal pulses.          Radial pulses are 2+ on the right side and 2+ on the left side.       Dorsalis pedis pulses are 2+ on the right side and 2+ on the left side.     Heart sounds: Normal heart sounds.  Pulmonary:     Effort: Pulmonary effort is normal.     Breath sounds: Normal breath sounds.  Abdominal:     General: Abdomen is flat. Bowel sounds are normal.     Palpations: Abdomen is soft.  Musculoskeletal:        General: Normal range  of motion.     Cervical back: Normal range of motion and neck supple.     Right lower leg: No edema.     Left lower leg: No edema.     Comments: Walker present   Feet:     Comments: Left foot is wrapped in an ace bandage  Lymphadenopathy:     Cervical: No cervical adenopathy.  Skin:    General: Skin is warm and dry.     Capillary Refill: Capillary refill takes less than 2 seconds.  Neurological:     General: No focal deficit present.     Mental Status: She is alert and oriented to person, place, and time. Mental status is at baseline.     Cranial Nerves: Cranial nerves are intact.     Sensory: Sensation is  intact.     Motor: Motor function is intact.     Coordination: Coordination is intact.     Gait: Gait is intact.     Deep Tendon Reflexes: Reflexes are normal and symmetric.  Psychiatric:        Attention and Perception: Attention normal.        Mood and Affect: Mood and affect normal.        Speech: Speech normal.        Behavior: Behavior normal. Behavior is cooperative.        Thought Content: Thought content normal.        Cognition and Memory: Cognition and memory normal.        Judgment: Judgment normal.     Depression Screening  Depression screen [facility] 2/9 04/23/2019 02/26/2019 02/06/2019 07/17/2018 04/28/2018  Decreased Interest 0 0 2 0 3  Down, Depressed, Hopeless 2 1 2  0 3  PHQ - 2 Score 2 1 4  0 6  Altered sleeping 0 0 0 - 3  Tired, decreased energy 2 3 3  - 3  Change in appetite 0 0 2 - 2  Feeling bad or failure about yourself  2 0 3 - 1  Trouble concentrating 0 2 3 - 3  Moving slowly or fidgety/restless 1 1 3  - 3  Suicidal thoughts 0 0 0 - 0  PHQ-9 Score 7 7 18  - 21  Difficult doing work/chores - - - - Extremely dIfficult  Some recent data might be hidden      Assessment & Plan:   1. Encounter for general adult medical examination with abnormal findings   2. Type 2 diabetes mellitus with hyperglycemia, with long-term current use of insulin (HCC)   3. Hyperlipidemia associated with type 2 diabetes mellitus ([place])   4. Essential hypertension   5. UNSTEADY GAIT   6. Primary osteoarthritis of left knee     Tests ordered Orders Placed This Encounter  Procedures  . CBC  . COMPLETE METABOLIC PANEL WITH GFR   Plan: Please see assessment and plan per problem list above.   No orders of the defined types were placed in this encounter.  Medicare Attestation I have personally reviewed: The patient's medical and social history Their use of alcohol, tobacco or illicit drugs Their current medications and supplements The patient's functional ability including ADLs,fall  risks, home safety risks, cognitive, and hearing and visual impairment Diet and physical activities Evidence for depression or mood disorders  The patient's weight, height, BMI, and visual acuity have been recorded in the chart.  I have made referrals, counseling, and provided education to the patient based on review of the above and I have provided  the patient with a written personalized care plan for preventive services.     Perlie Mayo, NP   12/08/2019

## 2019-12-08 NOTE — Assessment & Plan Note (Signed)
Controlled today in the office.  Laura Atkins is encouraged to maintain a well balanced diet that is low in salt. Controlled, continue current medication regimen.  Additionally, she is also reminded that exercise is beneficial for heart health and control of  Blood pressure. 30-60 minutes daily is recommended-walking was suggested.

## 2019-12-08 NOTE — Assessment & Plan Note (Signed)
Discussed monthly self breast exams and yearly mammograms; at least 30 minutes of aerobic activity at least 5 days/week and weight-bearing exercise 2x/week; proper sunscreen use reviewed; healthy diet, including goals of calcium and vitamin D intake and alcohol recommendations (less than or equal to 1 drink/day) reviewed; regular seatbelt use; changing batteries in smoke detectors.  Immunization recommendations discussed.  Colonoscopy recommendations reviewed.  

## 2019-12-08 NOTE — Patient Instructions (Addendum)
I appreciate the opportunity to provide you with care for your health and wellness. Today we discussed: overall care  Follow up: 3 months   Repeat labs in the next week Hydrate with water   We will work on finding a new Endocrinologist   Please continue to practice social distancing to keep you, your family, and our community safe.  If you must go out, please wear a mask and practice good handwashing.  It was a pleasure to see you and I look forward to continuing to work together on your health and well-being. Please do not hesitate to call the office if you need care or have questions about your care.  Have a wonderful day and week. With Gratitude, Cherly Beach, DNP, AGNP-BC  HEALTH MAINTENANCE RECOMMENDATIONS:  It is recommended that you get at least 30 minutes of aerobic exercise at least 5 days/week (for weight loss, you may need as much as 60-90 minutes). This can be any activity that gets your heart rate up. This can be divided in 10-15 minute intervals if needed, but try and build up your endurance at least once a week.  Weight bearing exercise is also recommended twice weekly.  Eat a healthy diet with lots of vegetables, fruits and fiber.  "Colorful" foods have a lot of vitamins (ie green vegetables, tomatoes, red peppers, etc).  Limit sweet tea, regular sodas and alcoholic beverages, all of which has a lot of calories and sugar.  Up to 1 alcoholic drink daily may be beneficial for women (unless trying to lose weight, watch sugars).  Drink a lot of water.  Calcium recommendations are 1200-1500 mg daily (1500 mg for postmenopausal women or women without ovaries), and vitamin D 1000 IU daily.  This should be obtained from diet and/or supplements (vitamins), and calcium should not be taken all at once, but in divided doses.  Monthly self breast exams and yearly mammograms for women over the age of 19 is recommended.  Sunscreen of at least SPF 30 should be used on all sun-exposed  parts of the skin when outside between the hours of 10 am and 4 pm (not just when at beach or pool, but even with exercise, golf, tennis, and yard work!)  Use a sunscreen that says "broad spectrum" so it covers both UVA and UVB rays, and make sure to reapply every 1-2 hours.  Remember to change the batteries in your smoke detectors when changing your clock times in the spring and fall.  Use your seat belt every time you are in a car, and please drive safely and not be distracted with cell phones and texting while driving.

## 2019-12-09 LAB — COMPLETE METABOLIC PANEL WITH GFR
AG Ratio: 1.3 (calc) (ref 1.0–2.5)
ALT: 16 U/L (ref 6–29)
AST: 14 U/L (ref 10–35)
Albumin: 4.2 g/dL (ref 3.6–5.1)
Alkaline phosphatase (APISO): 104 U/L (ref 37–153)
BUN/Creatinine Ratio: 22 (calc) (ref 6–22)
BUN: 29 mg/dL — ABNORMAL HIGH (ref 7–25)
CO2: 28 mmol/L (ref 20–32)
Calcium: 9.6 mg/dL (ref 8.6–10.4)
Chloride: 104 mmol/L (ref 98–110)
Creat: 1.3 mg/dL — ABNORMAL HIGH (ref 0.60–0.93)
GFR, Est African American: 46 mL/min/{1.73_m2} — ABNORMAL LOW (ref >=60)
GFR, Est Non African American: 40 mL/min/{1.73_m2} — ABNORMAL LOW (ref >=60)
Globulin: 3.2 g/dL (calc) (ref 1.9–3.7)
Glucose, Bld: 215 mg/dL — ABNORMAL HIGH (ref 65–99)
Potassium: 4.9 mmol/L (ref 3.5–5.3)
Sodium: 138 mmol/L (ref 135–146)
Total Bilirubin: 0.3 mg/dL (ref 0.2–1.2)
Total Protein: 7.4 g/dL (ref 6.1–8.1)

## 2019-12-09 LAB — VITAMIN B12: Vitamin B-12: 520 pg/mL (ref 200–1100)

## 2019-12-09 LAB — TEST AUTHORIZATION

## 2019-12-09 LAB — LIPID PANEL
Cholesterol: 149 mg/dL (ref <200)
HDL: 58 mg/dL (ref >=50)
LDL Cholesterol (Calc): 72 mg/dL (calc)
Non-HDL Cholesterol (Calc): 91 mg/dL (calc) (ref <130)
Total CHOL/HDL Ratio: 2.6 (calc) (ref <5.0)
Triglycerides: 100 mg/dL (ref <150)

## 2019-12-09 LAB — CBC
HCT: 29.7 % — ABNORMAL LOW (ref 35.0–45.0)
Hemoglobin: 8.7 g/dL — ABNORMAL LOW (ref 11.7–15.5)
MCH: 20.6 pg — ABNORMAL LOW (ref 27.0–33.0)
MCHC: 29.3 g/dL — ABNORMAL LOW (ref 32.0–36.0)
MCV: 70.4 fL — ABNORMAL LOW (ref 80.0–100.0)
MPV: 11.6 fL (ref 7.5–12.5)
Platelets: 177 10*3/uL (ref 140–400)
RBC: 4.22 10*6/uL (ref 3.80–5.10)
RDW: 15.1 % — ABNORMAL HIGH (ref 11.0–15.0)
WBC: 4.4 10*3/uL (ref 3.8–10.8)

## 2019-12-09 LAB — VITAMIN D 25 HYDROXY (VIT D DEFICIENCY, FRACTURES): Vit D, 25-Hydroxy: 38 ng/mL (ref 30–100)

## 2019-12-09 LAB — TSH: TSH: 2.24 mIU/L (ref 0.40–4.50)

## 2019-12-09 LAB — FERRITIN: Ferritin: 56 ng/mL (ref 16–288)

## 2019-12-09 LAB — IRON: Iron: 37 ug/dL — ABNORMAL LOW (ref 45–160)

## 2019-12-12 ENCOUNTER — Other Ambulatory Visit: Payer: Self-pay | Admitting: Family Medicine

## 2019-12-12 DIAGNOSIS — D509 Iron deficiency anemia, unspecified: Secondary | ICD-10-CM

## 2019-12-12 MED ORDER — FERROUS SULFATE 325 (65 FE) MG PO TABS: 325.0000 mg | ORAL_TABLET | Freq: Every day | ORAL | 5 refills | Status: DC

## 2019-12-15 ENCOUNTER — Ambulatory Visit: Payer: Medicare HMO | Admitting: Podiatry

## 2019-12-15 ENCOUNTER — Encounter (INDEPENDENT_AMBULATORY_CARE_PROVIDER_SITE_OTHER): Payer: Self-pay | Admitting: Gastroenterology

## 2019-12-17 ENCOUNTER — Other Ambulatory Visit: Payer: Self-pay | Admitting: *Deleted

## 2019-12-17 ENCOUNTER — Other Ambulatory Visit: Payer: Self-pay | Admitting: Family Medicine

## 2019-12-17 DIAGNOSIS — I10 Essential (primary) hypertension: Secondary | ICD-10-CM

## 2019-12-17 DIAGNOSIS — E1169 Type 2 diabetes mellitus with other specified complication: Secondary | ICD-10-CM

## 2019-12-17 DIAGNOSIS — E611 Iron deficiency: Secondary | ICD-10-CM

## 2019-12-21 ENCOUNTER — Ambulatory Visit (HOSPITAL_COMMUNITY)
Admission: RE | Admit: 2019-12-21 | Discharge: 2019-12-21 | Disposition: A | Discharge status: Home / Self Care | Payer: Medicare HMO | Source: Ambulatory Visit | Attending: Family Medicine | Admitting: Family Medicine

## 2019-12-21 ENCOUNTER — Other Ambulatory Visit: Payer: Self-pay

## 2019-12-21 DIAGNOSIS — Z1231 Encounter for screening mammogram for malignant neoplasm of breast: Secondary | ICD-10-CM | POA: Insufficient documentation

## 2020-01-04 ENCOUNTER — Other Ambulatory Visit: Payer: Self-pay | Admitting: *Deleted

## 2020-01-04 ENCOUNTER — Other Ambulatory Visit: Payer: Self-pay

## 2020-01-04 ENCOUNTER — Ambulatory Visit (HOSPITAL_COMMUNITY)
Admission: RE | Admit: 2020-01-04 | Discharge: 2020-01-04 | Disposition: A | Discharge status: Home / Self Care | Payer: Medicare HMO | Source: Ambulatory Visit | Attending: Family Medicine | Admitting: Family Medicine

## 2020-01-04 DIAGNOSIS — Z1231 Encounter for screening mammogram for malignant neoplasm of breast: Secondary | ICD-10-CM | POA: Diagnosis not present

## 2020-01-04 MED ORDER — AMLODIPINE BESYLATE 10 MG PO TABS: 10.0000 mg | ORAL_TABLET | Freq: Every day | ORAL | 2 refills | Status: DC

## 2020-01-13 ENCOUNTER — Other Ambulatory Visit: Payer: Self-pay | Admitting: Family Medicine

## 2020-01-18 ENCOUNTER — Ambulatory Visit: Payer: Medicare HMO | Admitting: Podiatry

## 2020-01-22 ENCOUNTER — Encounter (HOSPITAL_COMMUNITY): Payer: Self-pay | Admitting: Psychiatry

## 2020-01-22 ENCOUNTER — Telehealth (INDEPENDENT_AMBULATORY_CARE_PROVIDER_SITE_OTHER): Payer: Medicare HMO | Admitting: Psychiatry

## 2020-01-22 ENCOUNTER — Other Ambulatory Visit: Payer: Self-pay

## 2020-01-22 DIAGNOSIS — F32A Depression, unspecified: Secondary | ICD-10-CM

## 2020-01-22 DIAGNOSIS — F329 Major depressive disorder, single episode, unspecified: Secondary | ICD-10-CM

## 2020-01-22 MED ORDER — SERTRALINE HCL 100 MG PO TABS: 100.0000 mg | ORAL_TABLET | Freq: Every day | ORAL | 5 refills | Status: DC

## 2020-01-22 MED ORDER — BUPROPION HCL ER (XL) 150 MG PO TB24: 150.0000 mg | ORAL_TABLET | Freq: Every morning | ORAL | 5 refills | Status: DC
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
## 2020-01-27 ENCOUNTER — Other Ambulatory Visit: Payer: Self-pay

## 2020-01-27 ENCOUNTER — Ambulatory Visit: Payer: Medicare HMO | Admitting: Podiatry

## 2020-01-27 ENCOUNTER — Encounter: Payer: Self-pay | Admitting: Podiatry

## 2020-01-27 DIAGNOSIS — G8929 Other chronic pain: Secondary | ICD-10-CM | POA: Diagnosis not present

## 2020-01-27 DIAGNOSIS — E0843 Diabetes mellitus due to underlying condition with diabetic autonomic (poly)neuropathy: Secondary | ICD-10-CM

## 2020-01-27 DIAGNOSIS — L853 Xerosis cutis: Secondary | ICD-10-CM

## 2020-01-27 DIAGNOSIS — M79671 Pain in right foot: Secondary | ICD-10-CM

## 2020-01-27 DIAGNOSIS — B351 Tinea unguium: Secondary | ICD-10-CM

## 2020-01-27 DIAGNOSIS — S90522D Blister (nonthermal), left ankle, subsequent encounter: Secondary | ICD-10-CM

## 2020-01-27 DIAGNOSIS — M79676 Pain in unspecified toe(s): Secondary | ICD-10-CM | POA: Diagnosis not present

## 2020-01-27 NOTE — Patient Instructions (Addendum)
Apply Aquaphor ointment to feet daily  Apply compression stockings every morning, remove at supper time  Wear heel protectors when in bed  Diabetes Mellitus and [place] care is an important part of your health, especially when you have diabetes. Diabetes may cause you to have problems because of poor blood flow (circulation) to your feet and legs, which can cause your skin to:  Become thinner and drier.  Break more easily.  Heal more slowly.  Peel and crack. You may also have nerve damage (neuropathy) in your legs and feet, causing decreased feeling in them. This means that you may not notice minor injuries to your feet that could lead to more serious problems. Noticing and addressing any potential problems early is the best way to prevent future foot problems. How to care for your feet Foot hygiene  Wash your feet daily with warm water and mild soap. Do not use hot water. Then, pat your feet and the areas between your toes until they are completely dry. Do not soak your feet as this can dry your skin.  Trim your toenails straight across. Do not dig under them or around the cuticle. File the edges of your nails with an emery board or nail file.  Apply a moisturizing lotion or petroleum jelly to the skin on your feet and to dry, brittle toenails. Use lotion that does not contain alcohol and is unscented. Do not apply lotion between your toes. Shoes and socks  Wear clean socks or stockings every day. Make sure they are not too tight. Do not wear knee-high stockings since they may decrease blood flow to your legs.  Wear shoes that fit properly and have enough cushioning. Always look in your shoes before you put them on to be sure there are no objects inside.  To break in new shoes, wear them for just a few hours a day. This prevents injuries on your feet. Wounds, scrapes, corns, and calluses  Check your feet daily for blisters, cuts, bruises, sores, and redness. If you cannot  see the bottom of your feet, use a mirror or ask someone for help.  Do not cut corns or calluses or try to remove them with medicine.  If you find a minor scrape, cut, or break in the skin on your feet, keep it and the skin around it clean and dry. You may clean these areas with mild soap and water. Do not clean the area with peroxide, alcohol, or iodine.  If you have a wound, scrape, corn, or callus on your foot, look at it several times a day to make sure it is healing and not infected. Check for: ? Redness, swelling, or pain. ? Fluid or blood. ? Warmth. ? Pus or a bad smell. General instructions  Do not cross your legs. This may decrease blood flow to your feet.  Do not use heating pads or hot water bottles on your feet. They may burn your skin. If you have lost feeling in your feet or legs, you may not know this is happening until it is too late.  Protect your feet from hot and cold by wearing shoes, such as at the beach or on hot pavement.  Schedule a complete foot exam at least once a year (annually) or more often if you have foot problems. If you have foot problems, report any cuts, sores, or bruises to your health care provider immediately. Contact a health care provider if:  You have a medical condition that  increases your risk of infection and you have any cuts, sores, or bruises on your feet.  You have an injury that is not healing.  You have redness on your legs or feet.  You feel burning or tingling in your legs or feet.  You have pain or cramps in your legs and feet.  Your legs or feet are numb.  Your feet always feel cold.  You have pain around a toenail. Get help right away if:  You have a wound, scrape, corn, or callus on your foot and: ? You have pain, swelling, or redness that gets worse. ? You have fluid or blood coming from the wound, scrape, corn, or callus. ? Your wound, scrape, corn, or callus feels warm to the touch. ? You have pus or a bad smell  coming from the wound, scrape, corn, or callus. ? You have a fever. ? You have a red line going up your leg. Summary  Check your feet every day for cuts, sores, red spots, swelling, and blisters.  Moisturize feet and legs daily.  Wear shoes that fit properly and have enough cushioning.  If you have foot problems, report any cuts, sores, or bruises to your health care provider immediately.  Schedule a complete foot exam at least once a year (annually) or more often if you have foot problems. This information is not intended to replace advice given to you by your health care provider. Make sure you discuss any questions you have with your health care provider. Document Revised: 06/03/2019 Document Reviewed: 10/12/2016 Elsevier Patient Education  [place].

## 2020-02-02 ENCOUNTER — Other Ambulatory Visit: Payer: Self-pay | Admitting: Podiatrist

## 2020-02-02 DIAGNOSIS — S90822A Blister (nonthermal), left foot, initial encounter: Secondary | ICD-10-CM

## 2020-02-04 NOTE — Progress Notes (Signed)
Subjective: Laura Atkins presents today follow up right posterior heel fissure/pain, at risk foot care with history of diabetic neuropathy and painful mycotic nails b/l that are difficult to trim. Pain is aggravated when wearing enclosed shoe gear and relieved with periodic professional debridement  Fayrene Helper, MD is patient's PCP. Last visit was: 11/02/2019  She did see Dr. Valentina Lucks in our office on 12/07/2019 for blister on inside of left ankle and states she missed follow up appointment on 3/23.  She cancelled appointment on 4/26. She has h/o chronic edema and admits to wearing compression hose 2-3 days/week. She admits to not wearing her heel pillows nightly and right heel is sore. She denies any redness, drainage. Denies any fever, chills, night sweats, nausea or vomiting.  Past Medical History:  Diagnosis Date  . Alpha thalassemia trait 01/26/2010   02/2012: Nl CBC ex H&H-10.7/34.8, MCV-69   . Anemia   . Anxiety   . Anxiety and depression   . Cellulitis 05/09/2017  . Depression   . Diabetes mellitus   . Foot pain, right 04/30/2013  . GERD (gastroesophageal reflux disease)   . Headache(784.0)   . Hyperlipidemia   . Hypertension   . Iron deficiency 01/21/2017  . Microcytic anemia 01/26/2010   02/2012: Nl CBC ex H&H-10.7/34.8, MCV-69   . NECK PAIN, CHRONIC 10/21/2008   +chronic back pain    . Obesity   . Obstructive sleep apnea   . Osteoarthritis    Left knee; right shoulder; chronic neck and back pain  . Pruritus   . PVD (peripheral vascular disease) ([place]) 01/28/2014  . Seizures ([place])   . Shoulder pain, right 04/14/2015  . Tremor    This started months ago after her seizure progressing to very poor hand writing  . Urinary incontinence   . UTI (urinary tract infection) 01/18/2013     Current Outpatient Medications on File Prior to Visit  Medication Sig Dispense Refill  . Accu-Chek FastClix Lancets MISC 1 each by Does not apply route 4 (four) times daily. Use to monitor  glucose levels 4 times daily; E11.65 306 each 2  . acetaminophen (EXTRA STRENGTH PAIN RELIEF) 500 MG tablet Take 500 mg by mouth every 6 (six) hours as needed for pain.    . Alcohol Swabs (B-D SINGLE USE SWABS REGULAR) PADS 1 each by Does not apply route 4 (four) times daily. Use to prep site for glucose monitoring 4 times daily; E11.65 300 each 2  . amLODipine (NORVASC) 10 MG tablet Take 1 tablet (10 mg total) by mouth daily. 30 tablet 2  . aspirin 81 MG tablet Take 81 mg by mouth daily.      . ASPIRIN LOW DOSE 81 MG EC tablet Take 81 mg by mouth daily.    Marland Kitchen atorvastatin (LIPITOR) 40 MG tablet TAKE 1 TABLET BY MOUTH EVERY DAY 30 tablet 2  . benazepril (LOTENSIN) 20 MG tablet Take 1 tablet (20 mg total) by mouth daily. 30 tablet 5  . Blood Glucose Monitoring Suppl (ACCU-CHEK GUIDE ME) w/Device KIT 1 each by Does not apply route 4 (four) times daily. Use to monitor glucose levels 4 times daily; E11.65 1 kit 0  . buPROPion (WELLBUTRIN XL) 150 MG 24 hr tablet Take 1 tablet (150 mg total) by mouth every morning. 30 tablet 5  . Continuous Blood Gluc Receiver (FREESTYLE LIBRE 14 DAY READER) DEVI USE AS DIRECTED FOR ONE DOSE 1 each 0  . Continuous Blood Gluc Sensor (FREESTYLE LIBRE 14 DAY SENSOR)  MISC 1 Device by Does not apply route every 14 (fourteen) days. 6 each 3  . diclofenac (VOLTAREN) 75 MG EC tablet Take 1 tablet (75 mg total) by mouth 2 (two) times daily. 30 tablet 0  . ferrous sulfate 325 (65 FE) MG tablet Take 1 tablet (325 mg total) by mouth daily with breakfast. 30 tablet 5  . gabapentin (NEURONTIN) 400 MG capsule TAKE ONE CAPSULE BY MOUTH EVERY MORNING and TAKE ONE CAPSULE AT NOON and TAKE TWO CAPSULES AT BEDTIME 360 capsule 1  . gentamicin cream (GARAMYCIN) 0.1 % Apply 1 application topically 2 (two) times daily. 30 g 1  . glucose blood (ACCU-CHEK GUIDE) test strip 1 each by Other route 4 (four) times daily. Use to monitor glucose levels 4 times daily; E11.65 400 each 2  . hydrOXYzine  (ATARAX/VISTARIL) 10 MG tablet Take one tablet at bedtime as needed, for itching 30 tablet 0  . Insulin Syringe-Needle U-100 (INSULIN SYRINGE 1CC/31GX5/16") 31G X 5/16" 1 ML MISC 1 each by Does not apply route daily. Use to inject insulin daily; E11.65 90 each 2  . Multiple Vitamin (MULTIVITAMIN) capsule Take 1 capsule by mouth daily.    Marland Kitchen NOVOLIN 70/30 RELION (70-30) 100 UNIT/ML injection INJECT 70 UNITS SUBCUTANEOUSLY WITH BREAKFAST AND 10 UNITS WITH SUPPER 10 mL 0  . omeprazole (PRILOSEC) 20 MG capsule TAKE ONE CAPSULE BY MOUTH EVERY DAY 30 capsule 2  . potassium chloride SA (KLOR-CON) 20 MEQ tablet TAKE 1 TABLET BY MOUTH TWICE DAILY 180 tablet 1  . sertraline (ZOLOFT) 100 MG tablet Take 1 tablet (100 mg total) by mouth daily. 60 tablet 5  . torsemide (DEMADEX) 20 MG tablet TAKE TWO TABLETS BY MOUTH EVERY DAY (STOP lasix) 180 tablet 3  . UNABLE TO FIND Walker x 1  DX unsteady gait, osteoarthritis of left knee 1 each 0  . UNABLE TO FIND Elevated [place] Seat x 1 DX: unsteady gait, back pain, osteoarthritis of left knee 1 each 0   No current facility-administered medications on file prior to visit.     Allergies  Allergen Reactions  . Penicillins Shortness Of Breath, Itching and Rash  . Prednisone Shortness Of Breath, Itching and Rash  . Propoxyphene N-Acetaminophen Itching and Nausea And Vomiting    Objective: Laura Atkins is a pleasant 79 y.o. y.o. Patient Race: Black or [place] American  female in NAD. Morbidly obese. AAO x 3.  There were no vitals filed for this visit.  Vascular Examination: Capillary fill time to digits <3 seconds b/l. Faintly palpable DP pulses b/l. Nonpalpable PT pulses b/l. Pedal hair absent b/l Skin temperature gradient within normal limits b/l. Evidence of chronic venous insufficiency b/l LE. Nonpitting edema noted b/l LE.  Dermatological Examination: Pedal skin noted to be mildly to moderately dry.  No open wounds bilaterally. No interdigital  macerations bilaterally. Toenails 1-5 b/l elongated, dystrophic, thickened, crumbly with subungual debris and tenderness to dorsal palpation. Bullae/blister left lower medial ankle has resolved. Skin discoloration consistent with findings of of chronic venous insufficiency b/l LE: hyperpigmentation.  Posteromedial right heel with healed scarring from previous area of pressure/heel fissure. No edema, no erythema, no drainage, no blister formation, no warmth. It is tender to palpation.  Musculoskeletal: Normal muscle strength 5/5 to all lower extremity muscle groups bilaterally. Utilizes wheelchair for mobility assistance.  Neurological Examination: Protective sensation intact 5/5 intact bilaterally with 10g monofilament b/l. Vibratory sensation intact b/l. Proprioception intact bilaterally.  Assessment: No diagnosis found.  Plan: -Examined patient. -Toenails  1-5 b/l were debrided in length and girth with sterile nail nippers and dremel without iatrogenic bleeding.  -Patient to continue soft, supportive shoe gear daily. -Patient to report any pedal injuries to medical professional immediately. -Counseled patient on importance of utilizing heel pillows when in bed to prevent decubitus ulceration. Patient non-compliance is contributing to pain. Given written instructions to use heel pillows daily when in bed to prevent recurrence of heel decub. -Instructions written for her to apply compression stockings every morning and remove at supper time. -For dry skin, instructions written for her to apply Aquaphor ointment to feet daily. -Patient/POA to call should there be question/concern in the interim.  Return in about 3 months (around 04/28/2020) for diabetic nail trim.  Marzetta Board, DPM

## 2020-02-15 ENCOUNTER — Other Ambulatory Visit: Payer: Self-pay | Admitting: Cardiology

## 2020-02-16 ENCOUNTER — Other Ambulatory Visit: Payer: Self-pay

## 2020-02-16 MED ORDER — GENTAMICIN SULFATE 0.1 % EX CREA: 1.0000 "application " | TOPICAL_CREAM | Freq: Two times a day (BID) | CUTANEOUS | 1 refills | Status: DC

## 2020-02-23 ENCOUNTER — Encounter: Payer: Self-pay | Admitting: Family Medicine

## 2020-02-23 ENCOUNTER — Other Ambulatory Visit: Payer: Self-pay

## 2020-02-23 ENCOUNTER — Ambulatory Visit (INDEPENDENT_AMBULATORY_CARE_PROVIDER_SITE_OTHER): Payer: Medicare HMO | Admitting: Family Medicine

## 2020-02-23 VITALS — BP 124/82 | HR 81 | Temp 97.2°F | Resp 16 | Ht 68.0 in | Wt 272.0 lb

## 2020-02-23 DIAGNOSIS — M4712 Other spondylosis with myelopathy, cervical region: Secondary | ICD-10-CM

## 2020-02-23 DIAGNOSIS — R269 Unspecified abnormalities of gait and mobility: Secondary | ICD-10-CM | POA: Diagnosis not present

## 2020-02-23 DIAGNOSIS — I10 Essential (primary) hypertension: Secondary | ICD-10-CM | POA: Diagnosis not present

## 2020-02-23 DIAGNOSIS — Z794 Long term (current) use of insulin: Secondary | ICD-10-CM

## 2020-02-23 DIAGNOSIS — Z7409 Other reduced mobility: Secondary | ICD-10-CM

## 2020-02-23 DIAGNOSIS — E1165 Type 2 diabetes mellitus with hyperglycemia: Secondary | ICD-10-CM | POA: Diagnosis not present

## 2020-02-23 MED ORDER — CYCLOBENZAPRINE HCL 5 MG PO TABS: ORAL_TABLET | ORAL | 0 refills | Status: DC

## 2020-02-23 NOTE — Progress Notes (Signed)
Laura Atkins     MRN: [id number]      DOB: June 27, 1941   HPI Laura Atkins is here for follow up and re-evaluation of chronic medical conditions, medication management and review of any available recent lab and radiology data.  Main concern is that she get a new walker with the ability to help her stand up, as in a standing walker. C/o increased stiffness an swelling of joints with  Pain, she denies any falls since her last visit , but relies on use of a walker at all times for stability. C/o increasing back and hip stiffness, has established osteoarthritis in both as well as in the shoulders, she will benefit from,  and needs , a standing walker Preventive health is updated, specifically  Cancer screening and Immunization.   Questions or concerns regarding consultations or procedures which the PT has had in the interim are  addressed. The PT denies any adverse reactions to current medications since the last visit.  C/o left ear pain and pressure x several days, denies drainage from ear, sinus pressure , fever or chills, c/o increased pain and stiffness of left neck Denies hypoglycemia, still has elevated blood sugars often over 200, states she is working on this Denies any recent falls, however requests new standing walker  ROS Denies recent fever or chills. Denies sinus pressure, nasal congestion,  or sore throat. Denies chest congestion, productive cough or wheezing. Denies chest pains, palpitations and leg swelling Denies abdominal pain, nausea, vomiting,diarrhea or constipation.   Denies dysuria, frequency, hesitancy or incontinence. C/o chronic  joint pain, swelling and limitation in mobility.Progressively worsening Denies headaches, seizures, c/o lower extremity numbness, and  tingling. Denies uncontrolled  depression, anxiety or insomnia. Denies skin break down or rash.   PE  BP 124/82   Pulse 81   Temp (!) 97.2 F (36.2 C) (Temporal)   Resp 16   Ht 5\' 8"  (1.727 m)   Wt 272  lb (123.4 kg)   SpO2 96%   BMI 41.36 kg/m   Patient alert and oriented and in no cardiopulmonary distress.  HEENT: No facial asymmetry, EOMI,     Neck decreased ROM, left  Trapezius spasm, bilateral tM clear, no visible  cerumen .  Chest: Clear to auscultation bilaterally.  CVS: S1, S2 no murmurs, no S3.Regular rate.  ABD: Soft non tender.   Ext: No edema  MS: markedly decreased  ROM spine, shoulders, hips and knees.  Skin: Intact, no ulcerations or rash noted.  Psych: Good eye contact, normal affect. Memory intact not anxious or depressed appearing.  CNS: CN 2-12 intact, power,  normal throughout.no focal deficits noted.   Assessment & Plan  Spondylosis, cervical, with myelopathy Current flare of pain with left trapezius spasm, bedtime flexeril as needed, short course , is prescribed  Essential hypertension Controlled, no change in medication DASH diet and commitment to daily physical activity for a minimum of 30 minutes discussed and encouraged, as a part of hypertension management. The importance of attaining a healthy weight is also discussed.  BP/Weight 02/23/2020 12/08/2019 11/06/2019 11/02/2019 07/22/2019 06/02/2019 01/05/2439  Systolic BP 102 725 366 440 347 425 956  Diastolic BP 82 64 64 58 58 58 72  Wt. (Lbs) 272 277 281.8 272 272 272.2 260.12  BMI 41.36 42.12 42.85 41.36 41.36 41.39 39.55  Some encounter information is confidential and restricted. Go to Review Flowsheets activity to see all data.       Type 2 diabetes mellitus with hyperglycemia (  [place]) Uncontrolled and managed bu Endo, she is encouraged to be diligent with diet and medication Laura Atkins is reminded of the importance of commitment to daily physical activity for 30 minutes or more, as able and the need to limit carbohydrate intake to 30 to 60 grams per meal to help with blood sugar control.   The need to take medication as prescribed, test blood sugar as directed, and to call between visits if there  is a concern that blood sugar is uncontrolled is also discussed.   Laura Atkins is reminded of the importance of daily foot exam, annual eye examination, and good blood sugar, blood pressure and cholesterol control.  Diabetic Labs Latest Ref Rng & Units 12/01/2019 11/06/2019 06/02/2019 03/31/2019 11/26/2018  HbA1c 4.0 - 5.6 % - 9.8(A) 8.1(A) 9.3(A) 8.5(H)  Microalbumin mg/dL - - - - 33.9  Micro/Creat Ratio 0.0 - 30.0 mg/g creat - - - - -  Chol <200 mg/dL 149 - - - 171  HDL > OR = 50 mg/dL 58 - - - 60  Calc LDL mg/dL (calc) 72 - - - 90  Triglycerides <150 mg/dL 100 - - - 118  Creatinine 0.60 - 0.93 mg/dL 1.30(H) - - - 0.79   BP/Weight 02/23/2020 12/08/2019 11/06/2019 11/02/2019 07/22/2019 06/02/2019 1/93/7902  Systolic BP 409 735 329 924 268 341 962  Diastolic BP 82 64 64 58 58 58 72  Wt. (Lbs) 272 277 281.8 272 272 272.2 260.12  BMI 41.36 42.12 42.85 41.36 41.36 41.39 39.55  Some encounter information is confidential and restricted. Go to Review Flowsheets activity to see all data.   Foot/eye exam completion dates Latest Ref Rng & Units 02/23/2020 11/25/2019  Eye Exam No Retinopathy - Retinopathy(A)  Foot Form Completion - Done -        Limited mobility Increased limitation of mobility with progression of arthritis, note increased get up and go time, increased stiffness and difficulty with arising and getting up from a seated position, needs standing walker for increased independence and safety  UNSTEADY GAIT Home safety discussed and request for standing walker to be sent.   Morbid obesity  Patient re-educated about  the importance of commitment to a  minimum of 150 minutes of exercise per week as able.  The importance of healthy food choices with portion control discussed, as well as eating regularly and within a 12 hour window most days. The need to choose "clean , green" food 50 to 75% of the time is discussed, as well as to make water the primary drink and set a goal of 64 ounces water  daily.    Weight /BMI 02/23/2020 12/08/2019 11/06/2019  WEIGHT 272 lb 277 lb 281 lb 12.8 oz  HEIGHT 5\' 8"  5\' 8"  5\' 8"   BMI 41.36 kg/m2 42.12 kg/m2 42.85 kg/m2  Some encounter information is confidential and restricted. Go to Review Flowsheets activity to see all data.

## 2020-02-23 NOTE — Patient Instructions (Addendum)
Annual physical exam in office end August , call if you need me sooner  Flexeril one at bedtime for left neck spasm is prescribed, you may also take  ES tylenol one to two daily  Please keep All appointments with your Podiatrist, and vascular surgeon, due to neuropathy and poor circulation in the feet  We will order walker with ability to be used in standing position for you  Thanks for choosing [facility], we consider it a privelige to serve you.

## 2020-02-24 ENCOUNTER — Ambulatory Visit: Payer: Medicare HMO | Admitting: Family Medicine

## 2020-02-28 ENCOUNTER — Encounter: Payer: Self-pay | Admitting: Family Medicine

## 2020-02-28 NOTE — Assessment & Plan Note (Signed)
Increased limitation of mobility with progression of arthritis, note increased get up and go time, increased stiffness and difficulty with arising and getting up from a seated position, needs standing walker for increased independence and safety

## 2020-02-28 NOTE — Assessment & Plan Note (Signed)
Controlled, no change in medication DASH diet and commitment to daily physical activity for a minimum of 30 minutes discussed and encouraged, as a part of hypertension management. The importance of attaining a healthy weight is also discussed.  BP/Weight 02/23/2020 12/08/2019 11/06/2019 11/02/2019 07/22/2019 06/02/2019 7/94/4461  Systolic BP 901 222 411 464 314 276 701  Diastolic BP 82 64 64 58 58 58 72  Wt. (Lbs) 272 277 281.8 272 272 272.2 260.12  BMI 41.36 42.12 42.85 41.36 41.36 41.39 39.55  Some encounter information is confidential and restricted. Go to Review Flowsheets activity to see all data.

## 2020-02-28 NOTE — Assessment & Plan Note (Signed)
Current flare of pain with left trapezius spasm, bedtime flexeril as needed, short course , is prescribed

## 2020-02-28 NOTE — Assessment & Plan Note (Signed)
  Patient re-educated about  the importance of commitment to a  minimum of 150 minutes of exercise per week as able.  The importance of healthy food choices with portion control discussed, as well as eating regularly and within a 12 hour window most days. The need to choose "clean , green" food 50 to 75% of the time is discussed, as well as to make water the primary drink and set a goal of 64 ounces water daily.    Weight /BMI 02/23/2020 12/08/2019 11/06/2019  WEIGHT 272 lb 277 lb 281 lb 12.8 oz  HEIGHT 5\' 8"  5\' 8"  5\' 8"   BMI 41.36 kg/m2 42.12 kg/m2 42.85 kg/m2  Some encounter information is confidential and restricted. Go to Review Flowsheets activity to see all data.

## 2020-02-28 NOTE — Assessment & Plan Note (Addendum)
Uncontrolled and managed bu Endo, she is encouraged to be diligent with diet and medication Laura Atkins is reminded of the importance of commitment to daily physical activity for 30 minutes or more, as able and the need to limit carbohydrate intake to 30 to 60 grams per meal to help with blood sugar control.   The need to take medication as prescribed, test blood sugar as directed, and to call between visits if there is a concern that blood sugar is uncontrolled is also discussed.   Laura Atkins is reminded of the importance of daily foot exam, annual eye examination, and good blood sugar, blood pressure and cholesterol control.  Diabetic Labs Latest Ref Rng & Units 12/01/2019 11/06/2019 06/02/2019 03/31/2019 11/26/2018  HbA1c 4.0 - 5.6 % - 9.8(A) 8.1(A) 9.3(A) 8.5(H)  Microalbumin mg/dL - - - - 33.9  Micro/Creat Ratio 0.0 - 30.0 mg/g creat - - - - -  Chol <200 mg/dL 149 - - - 171  HDL > OR = 50 mg/dL 58 - - - 60  Calc LDL mg/dL (calc) 72 - - - 90  Triglycerides <150 mg/dL 100 - - - 118  Creatinine 0.60 - 0.93 mg/dL 1.30(H) - - - 0.79   BP/Weight 02/23/2020 12/08/2019 11/06/2019 11/02/2019 07/22/2019 06/02/2019 8/34/1962  Systolic BP 229 798 921 194 174 081 448  Diastolic BP 82 64 64 58 58 58 72  Wt. (Lbs) 272 277 281.8 272 272 272.2 260.12  BMI 41.36 42.12 42.85 41.36 41.36 41.39 39.55  Some encounter information is confidential and restricted. Go to Review Flowsheets activity to see all data.   Foot/eye exam completion dates Latest Ref Rng & Units 02/23/2020 11/25/2019  Eye Exam No Retinopathy - Retinopathy(A)  Foot Form Completion - Done -

## 2020-02-28 NOTE — Assessment & Plan Note (Signed)
Home safety discussed and request for standing walker to be sent.

## 2020-02-29 ENCOUNTER — Telehealth: Payer: Medicare HMO

## 2020-03-01 MED ORDER — UNABLE TO FIND: 0 refills | Status: DC

## 2020-03-01 NOTE — Addendum Note (Signed)
Addended by: Eual Fines on: 03/01/2020 09:11 AM   Modules accepted: Orders

## 2020-03-04 ENCOUNTER — Encounter: Payer: Medicare HMO | Admitting: Family Medicine

## 2020-03-09 ENCOUNTER — Other Ambulatory Visit: Payer: Self-pay

## 2020-03-09 ENCOUNTER — Ambulatory Visit (INDEPENDENT_AMBULATORY_CARE_PROVIDER_SITE_OTHER): Payer: Medicare HMO | Admitting: Family Medicine

## 2020-03-09 ENCOUNTER — Encounter: Payer: Self-pay | Admitting: Family Medicine

## 2020-03-09 VITALS — BP 122/68 | HR 100 | Temp 97.2°F | Ht 68.0 in | Wt 271.1 lb

## 2020-03-09 DIAGNOSIS — Z7409 Other reduced mobility: Secondary | ICD-10-CM

## 2020-03-09 DIAGNOSIS — M5440 Lumbago with sciatica, unspecified side: Secondary | ICD-10-CM | POA: Diagnosis not present

## 2020-03-09 DIAGNOSIS — R269 Unspecified abnormalities of gait and mobility: Secondary | ICD-10-CM

## 2020-03-09 NOTE — Assessment & Plan Note (Signed)
Referral to physical therapy sent

## 2020-03-09 NOTE — Assessment & Plan Note (Signed)
As previously documented increase limitation with mobility with progression of arthritis.  In addition to depression and lack of self motivation.  Previously noted increase get up and go time.  Increased stiffness and difficulty with rising and getting up from a seated position.  Needs standing walker and increasing dependence of safety.  Reports that she is scared and worried about falling.  Physical therapy ordered today.

## 2020-03-09 NOTE — Patient Instructions (Addendum)
I appreciate the opportunity to provide you with care for your health and wellness. Today we discussed: leg weakness  Follow up: as scheduled  No labs or  Referrals today PT in [place]  Continue to try to move each day. Try to find some chair exercises to do when walking is tougher. Remember if you don't move it you lose it.  Stay positive and focused that each day is a new day to keep trying.  Please continue to practice social distancing to keep you, your family, and our community safe.  If you must go out, please wear a mask and practice good handwashing.  It was a pleasure to see you and I look forward to continuing to work together on your health and well-being. Please do not hesitate to call the office if you need care or have questions about your care.  Have a wonderful day and week. With Gratitude, Cherly Beach, DNP, AGNP-BC

## 2020-03-09 NOTE — Assessment & Plan Note (Signed)
Ongoing low back pain with sciatica.  Referral to physical therapy.

## 2020-03-09 NOTE — Progress Notes (Signed)
Subjective:  Patient ID: Laura Atkins, female    DOB: Aug 16, 1941  Age: 79 y.o. MRN: [id number]  CC:  Chief Complaint  Patient presents with  . Follow-up    3 month follow up both legs are weak and hurt which is normal but they seem like they are getting weaker wants to see if she can get something to help with this       HPI  HPI   Laura Atkins is a 79 year old female patient who presents today for leg weakness.  She is recently seen by Dr. Moshe Cipro.  But reports that she is just continue to have leg weakness over the last couple of days to weeks.  She has had leg weakness in the past and uses a walker to get around.  She does suffer from depression which makes it difficult for her to want to self motivate to move around.  She has limited mobility with diabetic autonomic neuropathy. Back pain with lumbar region sciatica.  Limited mobility with unsteady gait.  She is willing to go to physical therapy and [place] she reports she needs somebody who is soft-spoken and encouraging.  She does not respond very well to authoritative aggressiveness.  Today patient denies signs and symptoms of COVID 19 infection including fever, chills, cough, shortness of breath, and headache. Past Medical, Surgical, Social History, Allergies, and Medications have been Reviewed.   Past Medical History:  Diagnosis Date  . Alpha thalassemia trait 01/26/2010   02/2012: Nl CBC ex H&H-10.7/34.8, MCV-69   . Anemia   . Anxiety   . Anxiety and depression   . Cellulitis 05/09/2017  . Depression   . Diabetes mellitus   . Foot pain, right 04/30/2013  . GERD (gastroesophageal reflux disease)   . Headache(784.0)   . Hyperlipidemia   . Hypertension   . Iron deficiency 01/21/2017  . Microcytic anemia 01/26/2010   02/2012: Nl CBC ex H&H-10.7/34.8, MCV-69   . NECK PAIN, CHRONIC 10/21/2008   +chronic back pain    . Obesity   . Obstructive sleep apnea   . Osteoarthritis    Left knee; right shoulder; chronic neck and back  pain  . Pruritus   . PVD (peripheral vascular disease) ([place]) 01/28/2014  . Seizures ([place])   . Shoulder pain, right 04/14/2015  . Tremor    This started months ago after her seizure progressing to very poor hand writing  . Urinary incontinence   . UTI (urinary tract infection) 01/18/2013    Current Meds  Medication Sig  . Accu-Chek FastClix Lancets MISC 1 each by Does not apply route 4 (four) times daily. Use to monitor glucose levels 4 times daily; E11.65  . acetaminophen (EXTRA STRENGTH PAIN RELIEF) 500 MG tablet Take 500 mg by mouth every 6 (six) hours as needed for pain.  . Alcohol Swabs (B-D SINGLE USE SWABS REGULAR) PADS 1 each by Does not apply route 4 (four) times daily. Use to prep site for glucose monitoring 4 times daily; E11.65  . amLODipine (NORVASC) 10 MG tablet Take 1 tablet (10 mg total) by mouth daily.  Marland Kitchen aspirin 81 MG tablet Take 81 mg by mouth daily.    . ASPIRIN LOW DOSE 81 MG EC tablet Take 81 mg by mouth daily.  Marland Kitchen atorvastatin (LIPITOR) 40 MG tablet TAKE 1 TABLET BY MOUTH EVERY DAY  . benazepril (LOTENSIN) 20 MG tablet Take 1 tablet (20 mg total) by mouth daily.  . Blood Glucose Monitoring Suppl (ACCU-CHEK GUIDE  ME) w/Device KIT 1 each by Does not apply route 4 (four) times daily. Use to monitor glucose levels 4 times daily; E11.65  . buPROPion (WELLBUTRIN XL) 150 MG 24 hr tablet Take 1 tablet (150 mg total) by mouth every morning.  . Continuous Blood Gluc Receiver (FREESTYLE LIBRE 14 DAY READER) DEVI USE AS DIRECTED FOR ONE DOSE  . Continuous Blood Gluc Sensor (FREESTYLE LIBRE 14 DAY SENSOR) MISC 1 Device by Does not apply route every 14 (fourteen) days.  . cyclobenzaprine (FLEXERIL) 5 MG tablet Take one  Tablet by mouth at bedtime for 1 week, then as needed  . diclofenac (VOLTAREN) 75 MG EC tablet Take 1 tablet (75 mg total) by mouth 2 (two) times daily.  Marland Kitchen doxycycline (VIBRAMYCIN) 100 MG capsule TAKE ONE CAPSULE BY MOUTH TWICE DAILY  . ferrous sulfate 325 (65 FE) MG  tablet Take 1 tablet (325 mg total) by mouth daily with breakfast.  . gabapentin (NEURONTIN) 400 MG capsule TAKE ONE CAPSULE BY MOUTH EVERY MORNING and TAKE ONE CAPSULE AT NOON and TAKE TWO CAPSULES AT BEDTIME  . gentamicin cream (GARAMYCIN) 0.1 % Apply 1 application topically 2 (two) times daily.  Marland Kitchen glucose blood (ACCU-CHEK GUIDE) test strip 1 each by Other route 4 (four) times daily. Use to monitor glucose levels 4 times daily; E11.65  . hydrOXYzine (ATARAX/VISTARIL) 10 MG tablet Take one tablet at bedtime as needed, for itching  . Insulin Syringe-Needle U-100 (INSULIN SYRINGE 1CC/31GX5/16") 31G X 5/16" 1 ML MISC 1 each by Does not apply route daily. Use to inject insulin daily; E11.65  . Multiple Vitamin (MULTIVITAMIN) capsule Take 1 capsule by mouth daily.  Marland Kitchen NOVOLIN 70/30 RELION (70-30) 100 UNIT/ML injection INJECT 70 UNITS SUBCUTANEOUSLY WITH BREAKFAST AND 10 UNITS WITH SUPPER  . omeprazole (PRILOSEC) 20 MG capsule TAKE ONE CAPSULE BY MOUTH EVERY DAY  . potassium chloride SA (KLOR-CON) 20 MEQ tablet TAKE 1 TABLET BY MOUTH TWICE DAILY  . sertraline (ZOLOFT) 100 MG tablet Take 1 tablet (100 mg total) by mouth daily.  Marland Kitchen torsemide (DEMADEX) 20 MG tablet TAKE TWO TABLETS BY MOUTH EVERY DAY (STOP lasix)  . UNABLE TO FIND Walker x 1  DX unsteady gait, osteoarthritis of left knee  . UNABLE TO FIND Elevated [place] Seat x 1 DX: unsteady gait, back pain, osteoarthritis of left knee  . UNABLE TO FIND Standing upright walker x 1  DX M54.40, M19.90    ROS:  Review of Systems  HENT: Negative.   Eyes: Negative.   Respiratory: Negative.   Cardiovascular: Negative.   Gastrointestinal: Negative.   Genitourinary: Negative.   Musculoskeletal:       See HPI  Skin: Negative.   Neurological: Negative.   Endo/Heme/Allergies: Negative.   Psychiatric/Behavioral: Negative.   All other systems reviewed and are negative.    Objective:   Today's Vitals: BP 122/68 (BP Location: Left Arm, Patient  Position: Sitting, Cuff Size: Normal)   Pulse 100   Temp (!) 97.2 F (36.2 C) (Temporal)   Ht 5' 8"  (1.727 m)   Wt 271 lb 1.9 oz (123 kg)   SpO2 97%   BMI 41.22 kg/m  Vitals with BMI 03/09/2020 02/23/2020 12/08/2019  Height 5' 8"  5' 8"  5' 8"   Weight 271 lbs 2 oz 272 lbs 277 lbs  BMI 41.23 92.11 94.17  Systolic 408 144 818  Diastolic 68 82 64  Pulse 563 81 84  Some encounter information is confidential and restricted. Go to Review Flowsheets activity to see all  data.     Physical Exam Vitals and nursing note reviewed.  Constitutional:      Appearance: Normal appearance. She is obese.  HENT:     Head: Normocephalic and atraumatic.     Right Ear: External ear normal.     Left Ear: External ear normal.     Mouth/Throat:     Comments: Mask in place Eyes:     General:        Right eye: No discharge.        Left eye: No discharge.     Conjunctiva/sclera: Conjunctivae normal.  Cardiovascular:     Rate and Rhythm: Normal rate and regular rhythm.     Pulses: Normal pulses.     Heart sounds: Normal heart sounds.  Pulmonary:     Effort: Pulmonary effort is normal.     Breath sounds: Normal breath sounds.  Musculoskeletal:     Cervical back: Normal range of motion and neck supple.     Comments: Walker present.  Limited ability for moving in lumbar and raising of legs.  Deconditioning increase.  Skin:    General: Skin is warm.  Neurological:     General: No focal deficit present.     Mental Status: She is alert and oriented to person, place, and time.  Psychiatric:        Mood and Affect: Mood normal.        Behavior: Behavior normal.        Thought Content: Thought content normal.        Judgment: Judgment normal.     Assessment   1. UNSTEADY GAIT   2. Limited mobility   3. Back pain of lumbar region with sciatica     Tests ordered No orders of the defined types were placed in this encounter.    Plan: Please see assessment and plan per problem list above.   No  orders of the defined types were placed in this encounter.   Patient to follow-up in as scheduled  Perlie Mayo, NP

## 2020-03-14 ENCOUNTER — Telehealth (INDEPENDENT_AMBULATORY_CARE_PROVIDER_SITE_OTHER): Payer: Medicare HMO

## 2020-03-14 ENCOUNTER — Other Ambulatory Visit: Payer: Self-pay

## 2020-03-14 VITALS — BP 122/68 | Ht 68.0 in | Wt 271.0 lb

## 2020-03-14 DIAGNOSIS — Z780 Asymptomatic menopausal state: Secondary | ICD-10-CM

## 2020-03-14 DIAGNOSIS — Z0000 Encounter for general adult medical examination without abnormal findings: Secondary | ICD-10-CM

## 2020-03-14 DIAGNOSIS — Z1159 Encounter for screening for other viral diseases: Secondary | ICD-10-CM | POA: Diagnosis not present

## 2020-03-14 NOTE — Patient Instructions (Signed)
Laura Atkins , Thank you for taking time to come for your Medicare Wellness Visit. I appreciate your ongoing commitment to your health goals. Please review the following plan we discussed and let me know if I can assist you in the future.   Screening recommendations/referrals: Colonoscopy: up to date  Mammogram: up to date  Bone Density: ordered and scheduled Recommended yearly ophthalmology/optometry visit for glaucoma screening and checkup Recommended yearly dental visit for hygiene and checkup  Vaccinations: Influenza vaccine: up to date Pneumococcal vaccine: up to date Tdap vaccine: up to date  Shingles vaccine: check with pharmacy if you want the shingrix vaccine        Next appointment: wellness in 1 year   Preventive Care 20 Years and Older, Female Preventive care refers to lifestyle choices and visits with your health care provider that can promote health and wellness. What does preventive care include?  A yearly physical exam. This is also called an annual well check.  Dental exams once or twice a year.  Routine eye exams. Ask your health care provider how often you should have your eyes checked.  Personal lifestyle choices, including:  Daily care of your teeth and gums.  Regular physical activity.  Eating a healthy diet.  Avoiding tobacco and drug use.  Limiting alcohol use.  Practicing safe sex.  Taking low-dose aspirin every day.  Taking vitamin and mineral supplements as recommended by your health care provider. What happens during an annual well check? The services and screenings done by your health care provider during your annual well check will depend on your age, overall health, lifestyle risk factors, and family history of disease. Counseling  Your health care provider may ask you questions about your:  Alcohol use.  Tobacco use.  Drug use.  Emotional well-being.  Home and relationship well-being.  Sexual activity.  Eating  habits.  History of falls.  Memory and ability to understand (cognition).  Work and work Statistician.  Reproductive health. Screening  You may have the following tests or measurements:  Height, weight, and BMI.  Blood pressure.  Lipid and cholesterol levels. These may be checked every 5 years, or more frequently if you are over 50 years old.  Skin check.  Lung cancer screening. You may have this screening every year starting at age 62 if you have a 30-pack-year history of smoking and currently smoke or have quit within the past 15 years.  Fecal occult blood test (FOBT) of the stool. You may have this test every year starting at age 31.  Flexible sigmoidoscopy or colonoscopy. You may have a sigmoidoscopy every 5 years or a colonoscopy every 10 years starting at age 67.  Hepatitis C blood test.  Hepatitis B blood test.  Sexually transmitted disease (STD) testing.  Diabetes screening. This is done by checking your blood sugar (glucose) after you have not eaten for a while (fasting). You may have this done every 1-3 years.  Bone density scan. This is done to screen for osteoporosis. You may have this done starting at age 14.  Mammogram. This may be done every 1-2 years. Talk to your health care provider about how often you should have regular mammograms. Talk with your health care provider about your test results, treatment options, and if necessary, the need for more tests. Vaccines  Your health care provider may recommend certain vaccines, such as:  Influenza vaccine. This is recommended every year.  Tetanus, diphtheria, and acellular pertussis (Tdap, Td) vaccine. You may need a  Td booster every 10 years.  Zoster vaccine. You may need this after age 37.  Pneumococcal 13-valent conjugate (PCV13) vaccine. One dose is recommended after age 73.  Pneumococcal polysaccharide (PPSV23) vaccine. One dose is recommended after age 37. Talk to your health care provider about which  screenings and vaccines you need and how often you need them. This information is not intended to replace advice given to you by your health care provider. Make sure you discuss any questions you have with your health care provider. Document Released: 10/07/2015 Document Revised: 05/30/2016 Document Reviewed: 07/12/2015 Elsevier Interactive Patient Education  2017 [place] Prevention in the Home Falls can cause injuries. They can happen to people of all ages. There are many things you can do to make your home safe and to help prevent falls. What can I do on the outside of my home?  Regularly fix the edges of walkways and driveways and fix any cracks.  Remove anything that might make you trip as you walk through a door, such as a raised step or threshold.  Trim any bushes or trees on the path to your home.  Use bright outdoor lighting.  Clear any walking paths of anything that might make someone trip, such as rocks or tools.  Regularly check to see if handrails are loose or broken. Make sure that both sides of any steps have handrails.  Any raised decks and porches should have guardrails on the edges.  Have any leaves, snow, or ice cleared regularly.  Use sand or salt on walking paths during winter.  Clean up any spills in your garage right away. This includes oil or grease spills. What can I do in the bathroom?  Use night lights.  Install grab bars by the toilet and in the tub and shower. Do not use towel bars as grab bars.  Use non-skid mats or decals in the tub or shower.  If you need to sit down in the shower, use a plastic, non-slip stool.  Keep the floor dry. Clean up any water that spills on the floor as soon as it happens.  Remove soap buildup in the tub or shower regularly.  Attach bath mats securely with double-sided non-slip rug tape.  Do not have throw rugs and other things on the floor that can make you trip. What can I do in the bedroom?  Use  night lights.  Make sure that you have a light by your bed that is easy to reach.  Do not use any sheets or blankets that are too big for your bed. They should not hang down onto the floor.  Have a firm chair that has side arms. You can use this for support while you get dressed.  Do not have throw rugs and other things on the floor that can make you trip. What can I do in the kitchen?  Clean up any spills right away.  Avoid walking on wet floors.  Keep items that you use a lot in easy-to-reach places.  If you need to reach something above you, use a strong step stool that has a grab bar.  Keep electrical cords out of the way.  Do not use floor polish or wax that makes floors slippery. If you must use wax, use non-skid floor wax.  Do not have throw rugs and other things on the floor that can make you trip. What can I do with my stairs?  Do not leave any items on the stairs.  Make sure that there are handrails on both sides of the stairs and use them. Fix handrails that are broken or loose. Make sure that handrails are as long as the stairways.  Check any carpeting to make sure that it is firmly attached to the stairs. Fix any carpet that is loose or worn.  Avoid having throw rugs at the top or bottom of the stairs. If you do have throw rugs, attach them to the floor with carpet tape.  Make sure that you have a light switch at the top of the stairs and the bottom of the stairs. If you do not have them, ask someone to add them for you. What else can I do to help prevent falls?  Wear shoes that:  Do not have high heels.  Have rubber bottoms.  Are comfortable and fit you well.  Are closed at the toe. Do not wear sandals.  If you use a stepladder:  Make sure that it is fully opened. Do not climb a closed stepladder.  Make sure that both sides of the stepladder are locked into place.  Ask someone to hold it for you, if possible.  Clearly mark and make sure that you  can see:  Any grab bars or handrails.  First and last steps.  Where the edge of each step is.  Use tools that help you move around (mobility aids) if they are needed. These include:  Canes.  Walkers.  Scooters.  Crutches.  Turn on the lights when you go into a dark area. Replace any light bulbs as soon as they burn out.  Set up your furniture so you have a clear path. Avoid moving your furniture around.  If any of your floors are uneven, fix them.  If there are any pets around you, be aware of where they are.  Review your medicines with your doctor. Some medicines can make you feel dizzy. This can increase your chance of falling. Ask your doctor what other things that you can do to help prevent falls. This information is not intended to replace advice given to you by your health care provider. Make sure you discuss any questions you have with your health care provider. Document Released: 07/07/2009 Document Revised: 02/16/2016 Document Reviewed: 10/15/2014 Elsevier Interactive Patient Education  2017 [employer].

## 2020-03-14 NOTE — Progress Notes (Signed)
Subjective:   Laura Atkins is a 79 y.o. female who presents for Medicare Annual (Subsequent) preventive examination.  Review of Systems     Cardiac Risk Factors include: diabetes mellitus;dyslipidemia;hypertension;obesity (BMI >30kg/m2);sedentary lifestyle     Objective:    Today's Vitals   03/14/20 1550  BP: 122/68  Weight: 271 lb (122.9 kg)  Height: _0  (1.727 m)  PainSc: 8    Body mass index is 41.21 kg/m.  Advanced Directives 02/24/2018 08/19/2017 05/08/2017 03/26/2017 03/22/2017 02/11/2017 01/24/2017  Does Patient Have a Medical Advance Directive? _1  No No  Type of Advance Directive - - - - - - -  Copy of [place] in Chart? - - - - - - -  Would patient like information on creating a medical advance directive? Yes (MAU/Ambulatory/Procedural Areas - Information given) No - Patient declined - Yes (MAU/Ambulatory/Procedural Areas - Information given) - Yes (MAU/Ambulatory/Procedural Areas - Information given) No - Patient declined  Pre-existing out of facility DNR order (yellow form or pink MOST form) - - - - - - -  Some encounter information is confidential and restricted. Go to Review Flowsheets activity to see all data.    Current Medications (verified) Outpatient Encounter Medications as of 03/14/2020  Medication Sig   Accu-Chek FastClix Lancets MISC 1 each by Does not apply route 4 (four) times daily. Use to monitor glucose levels 4 times daily; E11.65   acetaminophen (EXTRA STRENGTH PAIN RELIEF) 500 MG tablet Take 500 mg by mouth every 6 (six) hours as needed for pain.   Alcohol Swabs (B-D SINGLE USE SWABS REGULAR) PADS 1 each by Does not apply route 4 (four) times daily. Use to prep site for glucose monitoring 4 times daily; E11.65   amLODipine (NORVASC) 10 MG tablet Take 1 tablet (10 mg total) by mouth daily.   aspirin 81 MG tablet Take 81 mg by mouth daily.     ASPIRIN LOW DOSE 81 MG EC tablet Take 81 mg by mouth daily.    atorvastatin (LIPITOR) 40 MG tablet TAKE 1 TABLET BY MOUTH EVERY DAY   benazepril (LOTENSIN) 20 MG tablet Take 1 tablet (20 mg total) by mouth daily.   Blood Glucose Monitoring Suppl (ACCU-CHEK GUIDE ME) w/Device KIT 1 each by Does not apply route 4 (four) times daily. Use to monitor glucose levels 4 times daily; E11.65   buPROPion (WELLBUTRIN XL) 150 MG 24 hr tablet Take 1 tablet (150 mg total) by mouth every morning.   Continuous Blood Gluc Receiver (FREESTYLE LIBRE 14 DAY READER) DEVI USE AS DIRECTED FOR ONE DOSE   Continuous Blood Gluc Sensor (FREESTYLE LIBRE 14 DAY SENSOR) MISC 1 Device by Does not apply route every 14 (fourteen) days.   cyclobenzaprine (FLEXERIL) 5 MG tablet Take one  Tablet by mouth at bedtime for 1 week, then as needed   diclofenac (VOLTAREN) 75 MG EC tablet Take 1 tablet (75 mg total) by mouth 2 (two) times daily.   doxycycline (VIBRAMYCIN) 100 MG capsule TAKE ONE CAPSULE BY MOUTH TWICE DAILY   ferrous sulfate 325 (65 FE) MG tablet Take 1 tablet (325 mg total) by mouth daily with breakfast.   gabapentin (NEURONTIN) 400 MG capsule TAKE ONE CAPSULE BY MOUTH EVERY MORNING and TAKE ONE CAPSULE AT NOON and TAKE TWO CAPSULES AT BEDTIME   gentamicin cream (GARAMYCIN) 0.1 % Apply 1 application topically 2 (two) times daily.   glucose blood (ACCU-CHEK GUIDE) test strip 1 each by Other route  4 (four) times daily. Use to monitor glucose levels 4 times daily; E11.65   hydrOXYzine (ATARAX/VISTARIL) 10 MG tablet Take one tablet at bedtime as needed, for itching   Insulin Syringe-Needle U-100 (INSULIN SYRINGE 1CC/31GX5/16") 31G X 5/16" 1 ML MISC 1 each by Does not apply route daily. Use to inject insulin daily; E11.65   Multiple Vitamin (MULTIVITAMIN) capsule Take 1 capsule by mouth daily.   NOVOLIN 70/30 RELION (70-30) 100 UNIT/ML injection INJECT 70 UNITS SUBCUTANEOUSLY WITH BREAKFAST AND 10 UNITS WITH SUPPER   omeprazole (PRILOSEC) 20 MG capsule TAKE ONE CAPSULE BY  MOUTH EVERY DAY   potassium chloride SA (KLOR-CON) 20 MEQ tablet TAKE 1 TABLET BY MOUTH TWICE DAILY   sertraline (ZOLOFT) 100 MG tablet Take 1 tablet (100 mg total) by mouth daily.   torsemide (DEMADEX) 20 MG tablet TAKE TWO TABLETS BY MOUTH EVERY DAY (STOP lasix)   UNABLE TO FIND Walker x 1  DX unsteady gait, osteoarthritis of left knee   UNABLE TO FIND Elevated Toliet Seat x 1 DX: unsteady gait, back pain, osteoarthritis of left knee   UNABLE TO FIND Standing upright walker x 1  DX M54.40, M19.90   No facility-administered encounter medications on file as of 03/14/2020.    Allergies (verified) Penicillins, Prednisone, and Propoxyphene n-acetaminophen   History: Past Medical History:  Diagnosis Date   Alpha thalassemia trait 01/26/2010   02/2012: Nl CBC ex H&H-10.7/34.8, MCV-69    Anemia    Anxiety    Anxiety and depression    Cellulitis 05/09/2017   Depression    Diabetes mellitus    Foot pain, right 04/30/2013   GERD (gastroesophageal reflux disease)    Headache(784.0)    Hyperlipidemia    Hypertension    Iron deficiency 01/21/2017   Microcytic anemia 01/26/2010   02/2012: Nl CBC ex H&H-10.7/34.8, MCV-69    NECK PAIN, CHRONIC 10/21/2008   +chronic back pain     Obesity    Obstructive sleep apnea    Osteoarthritis    Left knee; right shoulder; chronic neck and back pain   Pruritus    PVD (peripheral vascular disease) ([place]) 01/28/2014   Seizures (HCC)    Shoulder pain, right 04/14/2015   Tremor    This started months ago after her seizure progressing to very poor hand writing   Urinary incontinence    UTI (urinary tract infection) 01/18/2013   Past Surgical History:  Procedure Laterality Date   ABDOMINAL HYSTERECTOMY     BREAST EXCISIONAL BIOPSY     Left; cyst   CATARACT EXTRACTION Right    12/2017   CATARACT EXTRACTION W/ INTRAOCULAR LENS IMPLANT Left 09/07/2013   CHOLECYSTECTOMY     COLONOSCOPY     COLONOSCOPY N/A 07/20/2015    Procedure: COLONOSCOPY;  Surgeon: Rogene Houston, MD;  Location: [facility];  Service: Endoscopy;  Laterality: N/A;  930   EYE SURGERY Left 09/07/2013   cataract   Family History  Problem Relation Age of Onset   Lung cancer Mother 30   Kidney disease Father    Diabetes Sister    Keloids Brother    ADD / ADHD Grandchild    Bipolar disorder Grandchild    Bipolar disorder Daughter    Seizures Daughter    Heart disease Daughter    Kidney disease Son    Neuropathy Son    Kidney disease Son    Edema Daughter    Breast cancer Daughter 46   Allergies Daughter    Alcohol  abuse Neg Hx    Drug abuse Neg Hx    Social History   Socioeconomic History   Marital status: Married    Spouse name: saunders   Number of children: 5   Years of education: 12   Highest education level: Not on file  Occupational History   Occupation: Disabled     Employer: RETIRED  Tobacco Use   Smoking status: Never Smoker   Smokeless tobacco: Never Used  Scientific laboratory technician Use: Never used  Substance and Sexual Activity   Alcohol use: No    Alcohol/week: 0.0 standard drinks   Drug use: No   Sexual activity: Yes    Birth control/protection: Surgical  Other Topics Concern   Not on file  Social History Narrative   Patient lives at home with her husband [name].  Patient is retired.    Right handed.    Five Children.    Caffeine- 2 daily   Social Determinants of Health   Financial Resource Strain: Low Risk    Difficulty of Paying Living Expenses: Not very hard  Food Insecurity: No Food Insecurity   Worried About Charity fundraiser in the Last Year: Never true   Ran Out of Food in the Last Year: Never true  Transportation Needs: No Transportation Needs   Lack of Transportation (Medical): No   Lack of Transportation (Non-Medical): No  Physical Activity: Inactive   Days of Exercise per Week: 0 days   Minutes of Exercise per Session: 0 min  Stress: No  Stress Concern Present   Feeling of Stress : Only a little  Social Connections: Moderately Integrated   Frequency of Communication with Friends and Family: Three times a week   Frequency of Social Gatherings with Friends and Family: Three times a week   Attends Religious Services: More than 4 times per year   Active Member of Clubs or Organizations: No   Attends Archivist Meetings: Never   Marital Status: Married    Tobacco Counseling Counseling given: Not Answered   Clinical Intake:  Pre-visit preparation completed: Yes  Pain Score: 8      Nutritional Status: BMI > 30  Obese Diabetes: Yes CBG done?: No CBG resulted in Enter/ Edit results?: No Did pt. bring in CBG monitor from home?: No  How often do you need to have someone help you when you read instructions, pamphlets, or other written materials from your doctor or pharmacy?: 2 - Rarely What is the last grade level you completed in school?: 11 th grade then got GED  Diabetic? yes  Interpreter Needed?: No  Information entered by :: [name], lpn   Activities of Daily Living In your present state of health, do you have any difficulty performing the following activities: 03/14/2020 03/09/2020  Hearing? N N  Vision? N N  Difficulty concentrating or making decisions? N Y  Walking or climbing stairs? Y Y  Dressing or bathing? Tempie Donning  Comment needs some assistance -  Doing errands, shopping? Tempie Donning  Preparing Food and eating ? N -  Using the Toilet? N -  In the past six months, have you accidently leaked urine? N -  Do you have problems with loss of bowel control? N -  Managing your Medications? N -  Managing your Finances? N -  Housekeeping or managing your Housekeeping? N -  Some recent data might be hidden    Patient Care Team: Fayrene Helper, MD as PCP - General  Marcial Pacas, MD as Consulting Physician (Neurology) Renato Shin, MD as Consulting Physician (Endocrinology) Cloria Spring,  MD as Consulting Physician ([place]) Alonza Smoker, LCSW as Social Worker (Psychiatry) Clent Jacks, MD as Consulting Physician (Ophthalmology)  Indicate any recent Medical Services you may have received from other than [facility] providers in the past year (date may be approximate).     Assessment:   This is a routine wellness examination for Leisa.  Hearing/Vision screen No exam data present  Dietary issues and exercise activities discussed: Current Exercise Habits: Home exercise routine, Exercise limited by: orthopedic condition(s)  Goals     Exercise 3x per week (30 min per time)     Recommend starting a routine exercise program at least 3 days a week for 30-45 minutes at a time as tolerated.        Depression Screen PHQ 2/9 Scores 03/09/2020 02/23/2020 04/23/2019 02/26/2019 02/06/2019 07/17/2018 04/28/2018  PHQ - 2 Score _0 0 6  PHQ- 9 Score _1 - 21  Exception Documentation - - - - - - -    Fall Risk Fall Risk  03/09/2020 02/23/2020 07/22/2019 05/13/2019 04/23/2019  Falls in the past year? 0 0 0 0 1  Number falls in past yr: 0 0 0 0 1  Injury with Fall? 0 0 0 0 0  Risk Factor Category  - - - - -  Risk for fall due to : Impaired mobility;Impaired balance/gait Impaired mobility - - -  Follow up Falls evaluation completed - - - -  Comment - - - - -    Any stairs in or around the home? No  If so, are there any without handrails? No  Home free of loose throw rugs in walkways, pet beds, electrical cords, etc? yes Adequate lighting in your home to reduce risk of falls? Yes   ASSISTIVE DEVICES UTILIZED TO PREVENT FALLS:  Life alert? No  Use of a cane, walker or w/c? Yes  Grab bars in the bathroom? Yes  Shower chair or bench in shower? Yes  Elevated toilet seat or a handicapped toilet? No   TIMED UP AND GO:  Was the test performed? No .  Length of time to ambulate 10 feet:  sec.   Visit performed virtually   Cognitive Function:     6CIT Screen  02/26/2019 02/24/2018 02/11/2017  What Year? 0 points 0 points 0 points  What month? 0 points 0 points 0 points  What time? 0 points 0 points 0 points  Count back from 20 0 points 0 points 0 points  Months in reverse 0 points 0 points 0 points  Repeat phrase 2 points 2 points 0 points  Total Score 2 2 0    Immunizations Immunization History  Administered Date(s) Administered   Fluad Quad(high Dose 65+) 05/20/2019   H1N1 07/15/2008   Influenza Split 06/17/2012   Influenza Whole 07/03/2007, 06/17/2009, 06/07/2011   Influenza, High Dose Seasonal PF 07/17/2018   Influenza,inj,Quad PF,6+ Mos 07/13/2013, 09/06/2014, 08/10/2015, 05/23/2016, 05/22/2017   Pneumococcal Conjugate-13 05/03/2014   Pneumococcal Polysaccharide-23 09/08/2010   Td 09/08/2010   Tdap 07/17/2018    TDAP status: Up to date Flu Vaccine status: Up to date Pneumococcal vaccine status: Up to date Covid-19 vaccine status: Completed vaccines  Qualifies for Shingles Vaccine? Yes   Zostavax completed No   Shingrix Completed?: No.    Education has been provided regarding the importance of this vaccine. Patient has been  advised to call insurance company to determine out of pocket expense if they have not yet received this vaccine. Advised may also receive vaccine at local pharmacy or Health Dept. Verbalized acceptance and understanding.  Screening Tests Health Maintenance  Topic Date Due   Hepatitis C Screening  Never done   COVID-19 Vaccine (1) Never done   INFLUENZA VACCINE  04/24/2020   HEMOGLOBIN A1C  05/05/2020   OPHTHALMOLOGY EXAM  11/24/2020   FOOT EXAM  02/27/2021   TETANUS/TDAP  07/17/2028   DEXA SCAN  Completed   PNA vac Low Risk Adult  Completed    Health Maintenance  Health Maintenance Due  Topic Date Due   Hepatitis C Screening  Never done   COVID-19 Vaccine (1) Never done    Colorectal cancer screening: Completed yes. Repeat every 10 years Mammogram status: Completed yes.  Repeat every year Bone Density status: Ordered today. Pt provided with contact info and advised to call to schedule appt.  Lung Cancer Screening: (Low Dose CT Chest recommended if Age 50-80 years, 30 pack-year currently smoking OR have quit w/in 15years.) does not qualify.   Lung Cancer Screening Referral: does not qualify  Additional Screening:  Hepatitis C Screening: does qualify; Completed Ordered today  Vision Screening: Recommended annual ophthalmology exams for early detection of glaucoma and other disorders of the eye. Is the patient up to date with their annual eye exam?  Yes  Who is the provider or what is the name of the office in which the patient attends annual eye exams? Dr Katy Fitch If pt is not established with a provider, would they like to be referred to a provider to establish care? No .   Dental Screening: Recommended annual dental exams for proper oral hygiene  Community Resource Referral / Chronic Care Management: CRR required this visit?  No   CCM required this visit?  No      Plan:     I have personally reviewed and noted the following in the patients chart:    Medical and social history  Use of alcohol, tobacco or illicit drugs   Current medications and supplements  Functional ability and status  Nutritional status  Physical activity  Advanced directives  List of other physicians  Hospitalizations, surgeries, and ER visits in previous 12 months  Vitals  Screenings to include cognitive, depression, and falls  Referrals and appointments  In addition, I have reviewed and discussed with patient certain preventive protocols, quality metrics, and best practice recommendations. A written personalized care plan for preventive services as well as general preventive health recommendations were provided to patient.     Kate Sable, LPN, LPN   5/70/1779   Nurse Notes:

## 2020-03-16 DIAGNOSIS — M6281 Muscle weakness (generalized): Secondary | ICD-10-CM | POA: Diagnosis not present

## 2020-03-21 ENCOUNTER — Telehealth: Payer: Medicare HMO

## 2020-03-22 ENCOUNTER — Other Ambulatory Visit: Payer: Self-pay | Admitting: Cardiology

## 2020-03-22 DIAGNOSIS — M6281 Muscle weakness (generalized): Secondary | ICD-10-CM | POA: Diagnosis not present

## 2020-03-23 ENCOUNTER — Other Ambulatory Visit (HOSPITAL_COMMUNITY): Payer: Medicare HMO

## 2020-03-24 ENCOUNTER — Ambulatory Visit (INDEPENDENT_AMBULATORY_CARE_PROVIDER_SITE_OTHER): Payer: Medicare HMO | Admitting: Gastroenterology

## 2020-03-24 DIAGNOSIS — M6281 Muscle weakness (generalized): Secondary | ICD-10-CM | POA: Diagnosis not present

## 2020-04-04 ENCOUNTER — Other Ambulatory Visit: Payer: Self-pay

## 2020-04-04 MED ORDER — FREESTYLE LIBRE 14 DAY SENSOR MISC: 1.0000 | 0 refills | Status: DC

## 2020-04-06 ENCOUNTER — Other Ambulatory Visit: Payer: Self-pay | Admitting: Family Medicine

## 2020-04-08 DIAGNOSIS — M6281 Muscle weakness (generalized): Secondary | ICD-10-CM | POA: Diagnosis not present

## 2020-04-13 ENCOUNTER — Other Ambulatory Visit: Payer: Self-pay | Admitting: Family Medicine

## 2020-04-13 DIAGNOSIS — M6281 Muscle weakness (generalized): Secondary | ICD-10-CM | POA: Diagnosis not present

## 2020-04-20 DIAGNOSIS — M6281 Muscle weakness (generalized): Secondary | ICD-10-CM | POA: Diagnosis not present

## 2020-04-22 ENCOUNTER — Other Ambulatory Visit: Payer: Self-pay | Admitting: Cardiology

## 2020-04-23 ENCOUNTER — Telehealth: Payer: Self-pay | Admitting: Endocrinology

## 2020-04-23 NOTE — Telephone Encounter (Signed)
1.  Please schedule f/u appt 2.  Then please refill x 2 mos, pending that appt.  

## 2020-04-25 ENCOUNTER — Other Ambulatory Visit: Payer: Self-pay | Admitting: Cardiology

## 2020-04-26 ENCOUNTER — Other Ambulatory Visit: Payer: Self-pay | Admitting: Cardiology

## 2020-04-29 ENCOUNTER — Encounter: Payer: Self-pay | Admitting: Podiatry

## 2020-04-29 ENCOUNTER — Other Ambulatory Visit: Payer: Self-pay

## 2020-04-29 ENCOUNTER — Ambulatory Visit: Payer: Medicare HMO | Admitting: Podiatry

## 2020-04-29 DIAGNOSIS — M79671 Pain in right foot: Secondary | ICD-10-CM | POA: Diagnosis not present

## 2020-04-29 DIAGNOSIS — E1165 Type 2 diabetes mellitus with hyperglycemia: Secondary | ICD-10-CM | POA: Diagnosis not present

## 2020-04-29 DIAGNOSIS — B351 Tinea unguium: Secondary | ICD-10-CM

## 2020-04-29 DIAGNOSIS — M79676 Pain in unspecified toe(s): Secondary | ICD-10-CM

## 2020-04-29 DIAGNOSIS — I872 Venous insufficiency (chronic) (peripheral): Secondary | ICD-10-CM

## 2020-04-29 DIAGNOSIS — Z794 Long term (current) use of insulin: Secondary | ICD-10-CM

## 2020-04-29 NOTE — Patient Instructions (Signed)
Mrs. Hodapp, please let your son or daughter read this paperwork.  PLEASE WEAR HEEL PROTECTORS WHENEVER YOU ARE IN BED TO PREVENT PRESSURE SORE ON RIGHT HEEL. IF YOU DO NOT WEAR HEEL PROTECTORS, YOUR SKIN COULD START TO BREAK DOWN INTO A WOUND.  DO NOT ATTEMPT TO CUT YOUR TOENAILS. WE WILL CUT THEM FOR YOU. I DO NOT WANT YOU TO INJURE YOURSELF BECAUSE YOU ARE DIABETIC.  YOU DID A VERY GOOD JOB ON MOISTURIZING YOUR FEET. KEEP UP THE GOOD WORK.  YOUR EDEMA HAS IMPROVED!

## 2020-04-30 NOTE — Progress Notes (Signed)
Subjective: Laura Atkins presents today follow up for at risk foot care with history of diabetic neuropathy and painful mycotic nails b/l that are difficult to trim. Pain is aggravated when wearing enclosed shoe gear and relieved with periodic professional debridement  She states she has been applying Vaseline Petroleum Jelly to her feet and legs. Also states she has noticed wrinkles in her feet.  She c/o right heel pain and states she has not been wearing her heel protectors.  She is inquiring about discoloration of anterior right ankle from at location of healed wound.  Laura Helper, MD is patient's PCP. Last visit was: 02/23/2020.  Past Medical History:  Diagnosis Date  . Alpha thalassemia trait 01/26/2010   02/2012: Nl CBC ex H&H-10.7/34.8, MCV-69   . Anemia   . Anxiety   . Anxiety and depression   . Cellulitis 05/09/2017  . Depression   . Diabetes mellitus   . Foot pain, right 04/30/2013  . GERD (gastroesophageal reflux disease)   . Headache(784.0)   . Hyperlipidemia   . Hypertension   . Iron deficiency 01/21/2017  . Microcytic anemia 01/26/2010   02/2012: Nl CBC ex H&H-10.7/34.8, MCV-69   . NECK PAIN, CHRONIC 10/21/2008   +chronic back pain    . Obesity   . Obstructive sleep apnea   . Osteoarthritis    Left knee; right shoulder; chronic neck and back pain  . Pruritus   . PVD (peripheral vascular disease) ([place]) 01/28/2014  . Seizures ([place])   . Shoulder pain, right 04/14/2015  . Tremor    This started months ago after her seizure progressing to very poor hand writing  . Urinary incontinence   . UTI (urinary tract infection) 01/18/2013     Current Outpatient Medications on File Prior to Visit  Medication Sig Dispense Refill  . Accu-Chek FastClix Lancets MISC 1 each by Does not apply route 4 (four) times daily. Use to monitor glucose levels 4 times daily; E11.65 306 each 2  . acetaminophen (EXTRA STRENGTH PAIN RELIEF) 500 MG tablet Take 500 mg by mouth every 6 (six) hours  as needed for pain.    . Alcohol Swabs (B-D SINGLE USE SWABS REGULAR) PADS 1 each by Does not apply route 4 (four) times daily. Use to prep site for glucose monitoring 4 times daily; E11.65 300 each 2  . amLODipine (NORVASC) 10 MG tablet TAKE 1 TABLET EVERY DAY 90 tablet 0  . aspirin 81 MG tablet Take 81 mg by mouth daily.      . ASPIRIN LOW DOSE 81 MG EC tablet Take 81 mg by mouth daily.    Marland Kitchen atorvastatin (LIPITOR) 40 MG tablet TAKE 1 TABLET BY MOUTH EVERY DAY 30 tablet 2  . benazepril (LOTENSIN) 20 MG tablet Take 1 tablet (20 mg total) by mouth daily. 30 tablet 5  . Blood Glucose Monitoring Suppl (ACCU-CHEK GUIDE ME) w/Device KIT 1 each by Does not apply route 4 (four) times daily. Use to monitor glucose levels 4 times daily; E11.65 1 kit 0  . buPROPion (WELLBUTRIN XL) 150 MG 24 hr tablet Take 1 tablet (150 mg total) by mouth every morning. 30 tablet 5  . Continuous Blood Gluc Receiver (FREESTYLE LIBRE 14 DAY READER) DEVI USE AS DIRECTED FOR ONE DOSE 1 each 0  . Continuous Blood Gluc Sensor (FREESTYLE LIBRE 14 DAY SENSOR) MISC 1 Device by Does not apply route every 14 (fourteen) days. 2 each 0  . cyclobenzaprine (FLEXERIL) 5 MG tablet Take one  Tablet by mouth at bedtime for 1 week, then as needed 15 tablet 0  . diclofenac (VOLTAREN) 75 MG EC tablet Take 1 tablet (75 mg total) by mouth 2 (two) times daily. 30 tablet 0  . doxycycline (VIBRAMYCIN) 100 MG capsule TAKE ONE CAPSULE BY MOUTH TWICE DAILY 20 capsule 0  . ferrous sulfate 325 (65 FE) MG tablet Take 1 tablet (325 mg total) by mouth daily with breakfast. 30 tablet 5  . gabapentin (NEURONTIN) 400 MG capsule TAKE ONE CAPSULE BY MOUTH EVERY MORNING and TAKE ONE CAPSULE AT NOON and TAKE TWO CAPSULES AT BEDTIME 360 capsule 1  . gentamicin cream (GARAMYCIN) 0.1 % Apply 1 application topically 2 (two) times daily. 30 g 1  . glucose blood (ACCU-CHEK GUIDE) test strip 1 each by Other route 4 (four) times daily. Use to monitor glucose levels 4 times  daily; E11.65 400 each 2  . hydrOXYzine (ATARAX/VISTARIL) 10 MG tablet Take one tablet at bedtime as needed, for itching 30 tablet 0  . Insulin Syringe-Needle U-100 (INSULIN SYRINGE 1CC/31GX5/16") 31G X 5/16" 1 ML MISC 1 each by Does not apply route daily. Use to inject insulin daily; E11.65 90 each 2  . Multiple Vitamin (MULTIVITAMIN) capsule Take 1 capsule by mouth daily.    Marland Kitchen NOVOLIN 70/30 RELION (70-30) 100 UNIT/ML injection INJECT 70 UNITS SUBCUTANEOUSLY WITH BREAKFAST AND 10 UNITS WITH SUPPER 10 mL 0  . omeprazole (PRILOSEC) 20 MG capsule TAKE ONE CAPSULE BY MOUTH EVERY DAY 30 capsule 2  . potassium chloride SA (KLOR-CON) 20 MEQ tablet TAKE 1 TABLET BY MOUTH TWICE DAILY 180 tablet 1  . sertraline (ZOLOFT) 100 MG tablet Take 1 tablet (100 mg total) by mouth daily. 60 tablet 5  . torsemide (DEMADEX) 20 MG tablet TAKE TWO TABLETS BY MOUTH EVERY DAY (STOP lasix) - NEEDS APPOINTMENT FOR MORE REFILLS - LAST SEEN 2018 5 tablet 0  . UNABLE TO FIND Walker x 1  DX unsteady gait, osteoarthritis of left knee 1 each 0  . UNABLE TO FIND Elevated Toliet Seat x 1 DX: unsteady gait, back pain, osteoarthritis of left knee 1 each 0  . UNABLE TO FIND Standing upright walker x 1  DX M54.40, M19.90 1 each 0   No current facility-administered medications on file prior to visit.     Allergies  Allergen Reactions  . Penicillins Shortness Of Breath, Itching and Rash  . Prednisone Shortness Of Breath, Itching and Rash  . Propoxyphene N-Acetaminophen Itching and Nausea And Vomiting    Objective: Laura Atkins is a pleasant 79 y.o. y.o. Patient Race: Black or [place] American  female in NAD. Morbidly obese. AAO x 3.  There were no vitals filed for this visit.  Vascular Examination: Capillary fill time to digits <3 seconds b/l. Faintly palpable DP pulses b/l. Nonpalpable PT pulses b/l. Pedal hair absent b/l Skin temperature gradient within normal limits b/l. Evidence of chronic venous insufficiency b/l  LE. Noted improvement in LE edema b/l LE.  Dermatological Examination: Pedal skin well moisturized.   No open wounds bilaterally. No interdigital macerations bilaterally. Toenails 1-5 b/l elongated, dystrophic, thickened, crumbly with subungual debris and tenderness to dorsal palpation.   Skin discoloration consistent with findings of of chronic venous insufficiency b/l LE: hyperpigmentation.  She has healed scarring on anterior aspect of right ankle.  Posteromedial right heel with tenderness to palpation.  No edema, no erythema, no drainage, no blister formation, no warmth.   Musculoskeletal: Normal muscle strength 5/5 to all lower  extremity muscle groups bilaterally. Utilizes wheelchair for mobility assistance.  Neurological Examination: Protective sensation intact 5/5 intact bilaterally with 10g monofilament b/l. Vibratory sensation intact b/l. Proprioception intact bilaterally.  Assessment: 1. Pain due to onychomycosis of toenail   2. Venous (peripheral) insufficiency   3. Type 2 diabetes mellitus with hyperglycemia, with long-term current use of insulin (HCC)   4. Pain of right heel    Plan: -Examined patient. -Dispensed written information for her children to read regarding her foot care. -Advised her to discontinue trimming her toenails.  -Encouraged her to wear her heel protectors when in bed. -Toenails 1-5 b/l were debrided in length and girth with sterile nail nippers and dremel without iatrogenic bleeding.  -Patient to continue soft, supportive shoe gear daily. -Patient to report any pedal injuries to medical professional immediately. -Counseled patient on importance of utilizing heel pillows when in bed to prevent decubitus ulceration. Patient non-compliance is contributing to pain. Given written instructions to use heel pillows daily when in bed to prevent recurrence of heel decub. -Continue compression stockings every morning and remove at supper time. -Patient/POA to  call should there be question/concern in the interim.  Return in about 3 months (around 07/30/2020) for diabetic nail trim.  Marzetta Board, DPM

## 2020-05-05 ENCOUNTER — Telehealth: Payer: Self-pay | Admitting: Cardiology

## 2020-05-05 ENCOUNTER — Other Ambulatory Visit: Payer: Self-pay

## 2020-05-05 DIAGNOSIS — E1165 Type 2 diabetes mellitus with hyperglycemia: Secondary | ICD-10-CM

## 2020-05-05 DIAGNOSIS — Z794 Long term (current) use of insulin: Secondary | ICD-10-CM

## 2020-05-05 MED ORDER — FREESTYLE LIBRE 14 DAY SENSOR MISC: 1.0000 | 0 refills | Status: DC

## 2020-05-05 MED ORDER — TORSEMIDE 20 MG PO TABS: ORAL_TABLET | ORAL | 1 refills | Status: DC

## 2020-05-05 NOTE — Telephone Encounter (Signed)
Patient requesting refill for sensors - patient is scheduled for Monday 05/09/20.

## 2020-05-05 NOTE — Telephone Encounter (Signed)
Outpatient Medication Detail   Disp Refills Start End   Continuous Blood Gluc Sensor (FREESTYLE LIBRE 14 DAY SENSOR) MISC 2 each 0 05/05/2020    Sig - Route: 1 Device by Does not apply route every 14 (fourteen) days. E11.9; MUST KEEP APPT IN ORDER TO PROCESS FUTURE REFILL REQUESTS - Does not apply   Sent to pharmacy as: Continuous Blood Gluc Sensor (FREESTYLE LIBRE 14 DAY SENSOR) Misc   E-Prescribing Status: Receipt confirmed by pharmacy (05/05/2020 11:33 AM EDT)

## 2020-05-05 NOTE — Telephone Encounter (Signed)
Refilled torsemide, has fu with Dr.Branch on 05/17/20

## 2020-05-09 ENCOUNTER — Ambulatory Visit: Payer: Medicare HMO | Admitting: Endocrinology

## 2020-05-09 ENCOUNTER — Other Ambulatory Visit: Payer: Self-pay

## 2020-05-09 ENCOUNTER — Encounter: Payer: Self-pay | Admitting: Endocrinology

## 2020-05-09 VITALS — BP 134/64 | HR 76 | Ht 68.0 in | Wt 270.8 lb

## 2020-05-09 DIAGNOSIS — Z794 Long term (current) use of insulin: Secondary | ICD-10-CM | POA: Diagnosis not present

## 2020-05-09 DIAGNOSIS — E1165 Type 2 diabetes mellitus with hyperglycemia: Secondary | ICD-10-CM

## 2020-05-09 LAB — POCT GLYCOSYLATED HEMOGLOBIN (HGB A1C): Hemoglobin A1C: 9.3 % — AB (ref 4.0–5.6)

## 2020-05-09 MED ORDER — NOVOLIN 70/30 RELION (70-30) 100 UNIT/ML ~~LOC~~ SUSP: 60.0000 [IU] | Freq: Every day | SUBCUTANEOUS | 11 refills | Status: DC

## 2020-05-09 NOTE — Progress Notes (Signed)
Subjective:    Patient ID: Laura Atkins, female    DOB: [birth year], 79 y.o.   MRN: [id number]  HPI Pt returns for f/u of diabetes mellitus: DM type: insulin-requiring type 2.   Dx'ed: 8416 Complications: polyneuropathy, PAD, and foot ulcer.   Therapy: insulin since 2005.   GDM: never.  DKA: never Severe hypoglycemia: last episode was 2020.    Pancreatitis: never.   SDOH: She takes human insulin, due to cost.   Other info: in early 2016, she changed to BID premixed insulin, due to poor results with multiple daily injections.   Interval history: dtr Regan Lemming) says pt misses the insulin 2-3 times per week; she still takes just 60 units qam, due to fear of hypoglycemia.  pt states she feels well in general.  I reviewed continuous glucose monitor data.  Glucose still varies from 170-300.  It is in general highest at noon and at 9PM, but there is little trend throughout the day.   Past Medical History:  Diagnosis Date  . Alpha thalassemia trait 01/26/2010   02/2012: Nl CBC ex H&H-10.7/34.8, MCV-69   . Anemia   . Anxiety   . Anxiety and depression   . Cellulitis 05/09/2017  . Depression   . Diabetes mellitus   . Foot pain, right 04/30/2013  . GERD (gastroesophageal reflux disease)   . Headache(784.0)   . Hyperlipidemia   . Hypertension   . Iron deficiency 01/21/2017  . Microcytic anemia 01/26/2010   02/2012: Nl CBC ex H&H-10.7/34.8, MCV-69   . NECK PAIN, CHRONIC 10/21/2008   +chronic back pain    . Obesity   . Obstructive sleep apnea   . Osteoarthritis    Left knee; right shoulder; chronic neck and back pain  . Pruritus   . PVD (peripheral vascular disease) ([place]) 01/28/2014  . Seizures ([place])   . Shoulder pain, right 04/14/2015  . Tremor    This started months ago after her seizure progressing to very poor hand writing  . Urinary incontinence   . UTI (urinary tract infection) 01/18/2013    Past Surgical History:  Procedure Laterality Date  . ABDOMINAL HYSTERECTOMY    . BREAST  EXCISIONAL BIOPSY     Left; cyst  . CATARACT EXTRACTION Right    12/2017  . CATARACT EXTRACTION W/ INTRAOCULAR LENS IMPLANT Left 09/07/2013  . CHOLECYSTECTOMY    . COLONOSCOPY    . COLONOSCOPY N/A 07/20/2015   Procedure: COLONOSCOPY;  Surgeon: Rogene Houston, MD;  Location: [facility];  Service: Endoscopy;  Laterality: N/A;  930  . EYE SURGERY Left 09/07/2013   cataract    Social History   Socioeconomic History  . Marital status: Married    Spouse name: saunders  . Number of children: 5  . Years of education: 84  . Highest education level: Not on file  Occupational History  . Occupation: Disabled     Employer: RETIRED  Tobacco Use  . Smoking status: Never Smoker  . Smokeless tobacco: Never Used  Vaping Use  . Vaping Use: Never used  Substance and Sexual Activity  . Alcohol use: No    Alcohol/week: 0.0 standard drinks  . Drug use: No  . Sexual activity: Yes    Birth control/protection: Surgical  Other Topics Concern  . Not on file  Social History Narrative   Patient lives at home with her husband [name].  Patient is retired.    Right handed.    Five Children.    Caffeine-  2 daily   Social Determinants of Health   Financial Resource Strain: Low Risk   . Difficulty of Paying Living Expenses: Not very hard  Food Insecurity: No Food Insecurity  . Worried About Charity fundraiser in the Last Year: Never true  . Ran Out of Food in the Last Year: Never true  Transportation Needs: No Transportation Needs  . Lack of Transportation (Medical): No  . Lack of Transportation (Non-Medical): No  Physical Activity: Inactive  . Days of Exercise per Week: 0 days  . Minutes of Exercise per Session: 0 min  Stress: No Stress Concern Present  . Feeling of Stress : Only a little  Social Connections: Moderately Integrated  . Frequency of Communication with Friends and Family: Three times a week  . Frequency of Social Gatherings with Friends and Family: Three times a week  .  Attends Religious Services: More than 4 times per year  . Active Member of Clubs or Organizations: No  . Attends Archivist Meetings: Never  . Marital Status: Married  Human resources officer Violence:   . Fear of Current or Ex-Partner:   . Emotionally Abused:   Marland Kitchen Physically Abused:   . Sexually Abused:     Current Outpatient Medications on File Prior to Visit  Medication Sig Dispense Refill  . Accu-Chek FastClix Lancets MISC 1 each by Does not apply route 4 (four) times daily. Use to monitor glucose levels 4 times daily; E11.65 306 each 2  . acetaminophen (EXTRA STRENGTH PAIN RELIEF) 500 MG tablet Take 500 mg by mouth every 6 (six) hours as needed for pain.    . Alcohol Swabs (B-D SINGLE USE SWABS REGULAR) PADS 1 each by Does not apply route 4 (four) times daily. Use to prep site for glucose monitoring 4 times daily; E11.65 300 each 2  . amLODipine (NORVASC) 10 MG tablet TAKE 1 TABLET EVERY DAY 90 tablet 0  . aspirin 81 MG tablet Take 81 mg by mouth daily.      . ASPIRIN LOW DOSE 81 MG EC tablet Take 81 mg by mouth daily.    Marland Kitchen atorvastatin (LIPITOR) 40 MG tablet TAKE 1 TABLET BY MOUTH EVERY DAY 30 tablet 2  . benazepril (LOTENSIN) 20 MG tablet Take 1 tablet (20 mg total) by mouth daily. 30 tablet 5  . Blood Glucose Monitoring Suppl (ACCU-CHEK GUIDE ME) w/Device KIT 1 each by Does not apply route 4 (four) times daily. Use to monitor glucose levels 4 times daily; E11.65 1 kit 0  . buPROPion (WELLBUTRIN XL) 150 MG 24 hr tablet Take 1 tablet (150 mg total) by mouth every morning. 30 tablet 5  . Continuous Blood Gluc Receiver (FREESTYLE LIBRE 14 DAY READER) DEVI USE AS DIRECTED FOR ONE DOSE 1 each 0  . Continuous Blood Gluc Sensor (FREESTYLE LIBRE 14 DAY SENSOR) MISC 1 Device by Does not apply route every 14 (fourteen) days. E11.9; MUST KEEP APPT IN ORDER TO PROCESS FUTURE REFILL REQUESTS 2 each 0  . cyclobenzaprine (FLEXERIL) 5 MG tablet Take one  Tablet by mouth at bedtime for 1 week, then  as needed 15 tablet 0  . diclofenac (VOLTAREN) 75 MG EC tablet Take 1 tablet (75 mg total) by mouth 2 (two) times daily. 30 tablet 0  . doxycycline (VIBRAMYCIN) 100 MG capsule TAKE ONE CAPSULE BY MOUTH TWICE DAILY 20 capsule 0  . ferrous sulfate 325 (65 FE) MG tablet Take 1 tablet (325 mg total) by mouth daily with breakfast. 30 tablet  5  . gabapentin (NEURONTIN) 400 MG capsule TAKE ONE CAPSULE BY MOUTH EVERY MORNING and TAKE ONE CAPSULE AT NOON and TAKE TWO CAPSULES AT BEDTIME 360 capsule 1  . gentamicin cream (GARAMYCIN) 0.1 % Apply 1 application topically 2 (two) times daily. 30 g 1  . glucose blood (ACCU-CHEK GUIDE) test strip 1 each by Other route 4 (four) times daily. Use to monitor glucose levels 4 times daily; E11.65 400 each 2  . hydrOXYzine (ATARAX/VISTARIL) 10 MG tablet Take one tablet at bedtime as needed, for itching 30 tablet 0  . Insulin Syringe-Needle U-100 (INSULIN SYRINGE 1CC/31GX5/16") 31G X 5/16" 1 ML MISC 1 each by Does not apply route daily. Use to inject insulin daily; E11.65 90 each 2  . Multiple Vitamin (MULTIVITAMIN) capsule Take 1 capsule by mouth daily.    Marland Kitchen omeprazole (PRILOSEC) 20 MG capsule TAKE ONE CAPSULE BY MOUTH EVERY DAY 30 capsule 2  . potassium chloride SA (KLOR-CON) 20 MEQ tablet TAKE 1 TABLET BY MOUTH TWICE DAILY 180 tablet 1  . sertraline (ZOLOFT) 100 MG tablet Take 1 tablet (100 mg total) by mouth daily. 60 tablet 5  . torsemide (DEMADEX) 20 MG tablet TAKE TWO TABLETS BY MOUTH EVERY DAY (STOP lasix) - NEEDS APPOINTMENT FOR MORE REFILLS - LAST SEEN 2018 60 tablet 1  . UNABLE TO FIND Walker x 1  DX unsteady gait, osteoarthritis of left knee 1 each 0  . UNABLE TO FIND Elevated [place] Seat x 1 DX: unsteady gait, back pain, osteoarthritis of left knee 1 each 0  . UNABLE TO FIND Standing upright walker x 1  DX M54.40, M19.90 1 each 0   No current facility-administered medications on file prior to visit.    Allergies  Allergen Reactions  . Penicillins  Shortness Of Breath, Itching and Rash  . Prednisone Shortness Of Breath, Itching and Rash  . Propoxyphene N-Acetaminophen Itching and Nausea And Vomiting    Family History  Problem Relation Age of Onset  . Lung cancer Mother 49  . Kidney disease Father   . Diabetes Sister   . Keloids Brother   . ADD / ADHD Grandchild   . Bipolar disorder Grandchild   . Bipolar disorder Daughter   . Seizures Daughter   . Heart disease Daughter   . Kidney disease Son   . Neuropathy Son   . Kidney disease Son   . Edema Daughter   . Breast cancer Daughter 11  . Allergies Daughter   . Alcohol abuse Neg Hx   . Drug abuse Neg Hx     BP 134/64   Pulse 76   Ht 5' 8"  (1.727 m)   Wt 270 lb 12.8 oz (122.8 kg)   SpO2 97%   BMI 41.17 kg/m    Review of Systems She denies hypoglycemia.      Objective:   Physical Exam VITAL SIGNS:  See vs page GENERAL: no distress Pulses: dorsalis pedis absent bilat (poss due to edema).   MSK: no deformity of the feet CV: 2+ bilat bilat leg edema Skin:  no ulcer on the feet.  normal temp on the feet.  There is hyperpigmentation of the legs and feet. Neuro: sensation is intact to touch on the feet, but decreased from normal.   Ext: there is bilateral onychomycosis of the toenails.    Lab Results  Component Value Date   HGBA1C 9.3 (A) 05/09/2020       Assessment & Plan:  Insulin-requiring type 2 DM, with PAD:  uncontrolled Noncompliance with insulin: we'll try to use insulin just QAM  Patient Instructions  check your blood sugar twice a day.  vary the time of day when you check, between before the 3 meals, and at bedtime.  also check if you have symptoms of your blood sugar being too high or too low.  please keep a record of the readings and bring it to your next appointment here (or you can bring the meter itself).  You can write it on any piece of paper.  please call us sooner if your blood sugar goes below 70, or if you have a lot of readings over  200. Please continue the same insulin: 60 units with breakfast, and none with supper, no matter what the blood sugar is.  It is really important to never miss the insulin On this type of insulin schedule, you should eat meals on a regular schedule.  If a meal is missed or significantly delayed, your blood sugar could go low.   Please come back for a follow-up appointment in 6 weeks.

## 2020-05-09 NOTE — Patient Instructions (Addendum)
check your blood sugar twice a day.  vary the time of day when you check, between before the 3 meals, and at bedtime.  also check if you have symptoms of your blood sugar being too high or too low.  please keep a record of the readings and bring it to your next appointment here (or you can bring the meter itself).  You can write it on any piece of paper.  please call us sooner if your blood sugar goes below 70, or if you have a lot of readings over 200. Please continue the same insulin: 60 units with breakfast, and none with supper, no matter what the blood sugar is.  It is really important to never miss the insulin On this type of insulin schedule, you should eat meals on a regular schedule.  If a meal is missed or significantly delayed, your blood sugar could go low.   Please come back for a follow-up appointment in 6 weeks.

## 2020-05-10 ENCOUNTER — Ambulatory Visit (INDEPENDENT_AMBULATORY_CARE_PROVIDER_SITE_OTHER): Payer: Medicare HMO | Admitting: Family Medicine

## 2020-05-10 ENCOUNTER — Other Ambulatory Visit: Payer: Self-pay

## 2020-05-10 ENCOUNTER — Encounter: Payer: Self-pay | Admitting: Family Medicine

## 2020-05-10 DIAGNOSIS — R410 Disorientation, unspecified: Secondary | ICD-10-CM | POA: Diagnosis not present

## 2020-05-10 DIAGNOSIS — I517 Cardiomegaly: Secondary | ICD-10-CM | POA: Diagnosis not present

## 2020-05-10 DIAGNOSIS — E161 Other hypoglycemia: Secondary | ICD-10-CM | POA: Diagnosis not present

## 2020-05-10 DIAGNOSIS — Z20822 Contact with and (suspected) exposure to covid-19: Secondary | ICD-10-CM | POA: Diagnosis not present

## 2020-05-10 DIAGNOSIS — R0902 Hypoxemia: Secondary | ICD-10-CM | POA: Diagnosis not present

## 2020-05-10 DIAGNOSIS — R404 Transient alteration of awareness: Secondary | ICD-10-CM | POA: Diagnosis not present

## 2020-05-10 DIAGNOSIS — R5383 Other fatigue: Secondary | ICD-10-CM | POA: Diagnosis not present

## 2020-05-10 DIAGNOSIS — I10 Essential (primary) hypertension: Secondary | ICD-10-CM | POA: Diagnosis not present

## 2020-05-10 DIAGNOSIS — J811 Chronic pulmonary edema: Secondary | ICD-10-CM | POA: Diagnosis not present

## 2020-05-10 DIAGNOSIS — R4020 Unspecified coma: Secondary | ICD-10-CM | POA: Diagnosis not present

## 2020-05-10 DIAGNOSIS — R4182 Altered mental status, unspecified: Secondary | ICD-10-CM | POA: Diagnosis not present

## 2020-05-10 DIAGNOSIS — E876 Hypokalemia: Secondary | ICD-10-CM | POA: Diagnosis not present

## 2020-05-10 DIAGNOSIS — R531 Weakness: Secondary | ICD-10-CM | POA: Diagnosis not present

## 2020-05-10 DIAGNOSIS — E162 Hypoglycemia, unspecified: Secondary | ICD-10-CM | POA: Diagnosis not present

## 2020-05-10 DIAGNOSIS — I700 Atherosclerosis of aorta: Secondary | ICD-10-CM | POA: Diagnosis not present

## 2020-05-10 NOTE — Progress Notes (Signed)
   Laura Atkins     MRN: [id number]      DOB: 1940-09-28   HPI Laura Atkins is here with her daughter who reports that her Mother has been less interactive and weak over the past 5 daYS.  bLOOD SUGAR THIS MORNING IOS 210. nO H/O FEVR OR CHILLS No h/o fall ROS Denies recent fever or chills. Denies sinus pressure, nasal congestion, ear pain or sore throat. Denies chest congestion, productive cough or wheezing. Denies chest pains, palpitations and leg swelling Denies abdominal pain, nausea, vomiting,diarrhea or constipation.   Denies dysuria, frequency, hesitancy or incontinence. Denies joint pain, swelling and limitation in mobility. Denies headaches, seizures, numbness, or tingling. Denies depression, anxiety or insomnia. Denies skin break down or rash.   PE  BP (!) 171/63   Pulse 76   Ht 5\' 8"  (1.727 m)   SpO2 92%   BMI 41.17 kg/m   Patient drowsy,HEENT: No facial asymmetry, EOMI,     Neck decreased ROM .  Chest: Clear to auscultation bilaterally.  CVS: S1, S2 no murmurs, no S3.Regular rate.  ABD: Soft non tender.   Ext:  trace edema  edema  MS: Decreased  ROM spine, shoulders, hips and knees.       Assessment & Plan  Altered mental status 5 day h/o altered mental straus and reduced responsiveness. Clinical presentation is c/w history ED to evaluate and manage. Direct contact made with ED Physician Ambulance transported patient across the street

## 2020-05-10 NOTE — Assessment & Plan Note (Signed)
5 day h/o altered mental straus and reduced responsiveness. Clinical presentation is c/w history ED to evaluate and manage. Direct contact made with ED Physician Ambulance transported patient across the street

## 2020-05-10 NOTE — Patient Instructions (Signed)
You need to go directly to the ED for further assessment, I have spoken to the Physician in the Ed and the Doc is expecting you. Reschedule annual exam for 6 weeks please

## 2020-05-15 ENCOUNTER — Other Ambulatory Visit: Payer: Self-pay | Admitting: Family Medicine

## 2020-05-16 ENCOUNTER — Telehealth: Payer: Self-pay

## 2020-05-16 ENCOUNTER — Telehealth: Payer: Self-pay | Admitting: Family Medicine

## 2020-05-16 NOTE — Telephone Encounter (Signed)
Laura Atkins is aware but is not happy with the recommendation

## 2020-05-16 NOTE — Telephone Encounter (Signed)
I recommend re evaluatuion in the ED, I have tried to see the ED visit but cannot find it, where did she go?  I

## 2020-05-16 NOTE — Telephone Encounter (Signed)
I called back and spoke directly to Laura Atkins re iterating the need for pt to be taken back to ED for re evaluation based on her diminished level of functio in the past 3 weeks. Confused, forgetful, limited mobility due to weakness. She reported fouls smelling urine and this was no checked, the importance of re evaluation I the ED to determine the underlying problem was stressed. She is concerned about covid everywhere and stated that the ambulance driver told her this ed was full

## 2020-05-16 NOTE — Telephone Encounter (Signed)
Laura Atkins daughter is stating that when [name] went to the ER nothing was really done for her and that she is showing altered mental status and having strong smelling urine, the hospital didn't check for UTI nor did any imaging to check for possible stroke that the EMS driver seemed to think she had, family is still concerned and are having to help her to the bathroom each time she is having limited mobility, please advise?

## 2020-05-17 ENCOUNTER — Encounter: Payer: Self-pay | Admitting: Family Medicine

## 2020-05-17 ENCOUNTER — Ambulatory Visit (INDEPENDENT_AMBULATORY_CARE_PROVIDER_SITE_OTHER): Payer: Medicare HMO | Admitting: Family Medicine

## 2020-05-17 VITALS — BP 128/56 | HR 78 | Ht 68.0 in | Wt 257.8 lb

## 2020-05-17 DIAGNOSIS — E119 Type 2 diabetes mellitus without complications: Secondary | ICD-10-CM

## 2020-05-17 DIAGNOSIS — E782 Mixed hyperlipidemia: Secondary | ICD-10-CM

## 2020-05-17 DIAGNOSIS — R600 Localized edema: Secondary | ICD-10-CM

## 2020-05-17 DIAGNOSIS — I10 Essential (primary) hypertension: Secondary | ICD-10-CM

## 2020-05-17 NOTE — Progress Notes (Signed)
Cardiology Office Note  Date: 05/17/2020   ID: Laura Atkins, DOB [birth year], MRN [id number]  PCP:  Fayrene Helper, MD  Cardiologist:  Carlyle Dolly, MD Electrophysiologist:  None   Chief Complaint:   History of Present Illness: Laura Atkins is a 79 y.o. female with a history of lower extremity edema, leg pain, HTN, HLD, alpha thalassemia, CKD stage III, OSA, obesity, DM 2, iron deficiency anemia.  Last seen by Dr. Harl Bowie on 04/08/2017 with complaints of lower extremity edema.  She stated the swelling comes and goes.  She was taking torsemide 40 mg in a.m. and additional as needed.  She was having cramping in the backs of her legs with exertion.  No rest pain or sores on her feet.  ABIs in 2016 were normal, she was compliant with her hypertensive medications.  She was compliant with her statins.  Recent presentation to [facility] ED [place] 05/10/2020 for evaluation altered mental status with increased fatigue over the past few days.  Nursing staff noted patient had low blood sugar via EMS requiring IV dextrose prior to arrival. Labs showed initial BS in ED to be 63. She was given IVF with dextrose and BS improved. She was hypokalemic 3.3  and hypophosphatemic on arrival. UA showed small leukocytes and trace blood. UC + staph hemolyticus. CT of head showed no acute process. After blood sugar improved patient requested discharge.   She is here today with her daughter and husband. Her daughter is complaining of what "was not done at [facility]" during recent visit. I was attempting to examine the patient and daughter was insistent that I speak directly to her and not the patient. The patient is responding appropriately and has no evidence of cognitive or neurologic deficit, altered mental status. Patient states she has no symptoms at all. She denies any anginal or exertional symptoms, orthostatic symptoms, palpitations or arrhythmias, PND,orthopnea, bleeding, claudication, DVT or PE  symptoms. She has mild LE edema.     Past Medical History:  Diagnosis Date  . Alpha thalassemia trait 01/26/2010   02/2012: Nl CBC ex H&H-10.7/34.8, MCV-69   . Anemia   . Anxiety   . Anxiety and depression   . Cellulitis 05/09/2017  . Depression   . Diabetes mellitus   . Foot pain, right 04/30/2013  . GERD (gastroesophageal reflux disease)   . Headache(784.0)   . Hyperlipidemia   . Hypertension   . Iron deficiency 01/21/2017  . Microcytic anemia 01/26/2010   02/2012: Nl CBC ex H&H-10.7/34.8, MCV-69   . NECK PAIN, CHRONIC 10/21/2008   +chronic back pain    . Obesity   . Obstructive sleep apnea   . Osteoarthritis    Left knee; right shoulder; chronic neck and back pain  . Pruritus   . PVD (peripheral vascular disease) ([place]) 01/28/2014  . Seizures ([place])   . Shoulder pain, right 04/14/2015  . Tremor    This started months ago after her seizure progressing to very poor hand writing  . Urinary incontinence   . UTI (urinary tract infection) 01/18/2013    Past Surgical History:  Procedure Laterality Date  . ABDOMINAL HYSTERECTOMY    . BREAST EXCISIONAL BIOPSY     Left; cyst  . CATARACT EXTRACTION Right    12/2017  . CATARACT EXTRACTION W/ INTRAOCULAR LENS IMPLANT Left 09/07/2013  . CHOLECYSTECTOMY    . COLONOSCOPY    . COLONOSCOPY N/A 07/20/2015   Procedure: COLONOSCOPY;  Surgeon: Rogene Houston, MD;  Location:  [facility];  Service: Endoscopy;  Laterality: N/A;  930  . EYE SURGERY Left 09/07/2013   cataract    Current Outpatient Medications  Medication Sig Dispense Refill  . acetaminophen (EXTRA STRENGTH PAIN RELIEF) 500 MG tablet Take 500 mg by mouth every 6 (six) hours as needed for pain.    Marland Kitchen amLODipine (NORVASC) 10 MG tablet TAKE 1 TABLET EVERY DAY 90 tablet 0  . aspirin 81 MG tablet Take 81 mg by mouth daily.      Marland Kitchen atorvastatin (LIPITOR) 40 MG tablet TAKE 1 TABLET BY MOUTH EVERY DAY 30 tablet 2  . benazepril (LOTENSIN) 20 MG tablet TAKE 1 TABLET BY MOUTH EVERY DAY 30  tablet 5  . buPROPion (WELLBUTRIN XL) 150 MG 24 hr tablet Take 1 tablet (150 mg total) by mouth every morning. 30 tablet 5  . Cholecalciferol 125 MCG (5000 UT) TABS Take 1 tablet by mouth daily.    . cyclobenzaprine (FLEXERIL) 5 MG tablet Take one  Tablet by mouth at bedtime for 1 week, then as needed 15 tablet 0  . diphenhydrAMINE (BENADRYL) 25 MG tablet Take 25 mg by mouth at bedtime.    Marland Kitchen doxycycline (VIBRAMYCIN) 100 MG capsule TAKE ONE CAPSULE BY MOUTH TWICE DAILY 20 capsule 0  . gabapentin (NEURONTIN) 400 MG capsule TAKE ONE CAPSULE BY MOUTH EVERY MORNING and TAKE ONE CAPSULE AT NOON and TAKE TWO CAPSULES AT BEDTIME 360 capsule 1  . insulin NPH-regular Human (NOVOLIN 70/30 RELION) (70-30) 100 UNIT/ML injection Inject 60 Units into the skin daily with breakfast. 20 mL 11  . Multiple Vitamin (MULTIVITAMIN) capsule Take 1 capsule by mouth daily.    Marland Kitchen omeprazole (PRILOSEC) 20 MG capsule TAKE ONE CAPSULE BY MOUTH EVERY DAY 30 capsule 2  . sertraline (ZOLOFT) 100 MG tablet Take 1 tablet (100 mg total) by mouth daily. 60 tablet 5  . torsemide (DEMADEX) 20 MG tablet TAKE TWO TABLETS BY MOUTH EVERY DAY (STOP lasix) - NEEDS APPOINTMENT FOR MORE REFILLS - LAST SEEN 2018 60 tablet 1  . Accu-Chek FastClix Lancets MISC 1 each by Does not apply route 4 (four) times daily. Use to monitor glucose levels 4 times daily; E11.65 306 each 2  . Alcohol Swabs (B-D SINGLE USE SWABS REGULAR) PADS 1 each by Does not apply route 4 (four) times daily. Use to prep site for glucose monitoring 4 times daily; E11.65 300 each 2  . Blood Glucose Monitoring Suppl (ACCU-CHEK GUIDE ME) w/Device KIT 1 each by Does not apply route 4 (four) times daily. Use to monitor glucose levels 4 times daily; E11.65 1 kit 0  . Continuous Blood Gluc Sensor (FREESTYLE LIBRE 14 DAY SENSOR) MISC 1 Device by Does not apply route every 14 (fourteen) days. E11.9; MUST KEEP APPT IN ORDER TO PROCESS FUTURE REFILL REQUESTS 2 each 0  . ferrous sulfate 325  (65 FE) MG tablet Take 1 tablet (325 mg total) by mouth daily with breakfast. 30 tablet 5  . glucose blood (ACCU-CHEK GUIDE) test strip 1 each by Other route 4 (four) times daily. Use to monitor glucose levels 4 times daily; E11.65 400 each 2  . Insulin Syringe-Needle U-100 (INSULIN SYRINGE 1CC/31GX5/16") 31G X 5/16" 1 ML MISC 1 each by Does not apply route daily. Use to inject insulin daily; E11.65 90 each 2  . potassium chloride SA (KLOR-CON) 20 MEQ tablet TAKE 1 TABLET BY MOUTH TWICE DAILY 180 tablet 1  . UNABLE TO FIND Walker x 1  DX unsteady gait,  osteoarthritis of left knee 1 each 0  . UNABLE TO FIND Elevated [place] Seat x 1 DX: unsteady gait, back pain, osteoarthritis of left knee 1 each 0  . UNABLE TO FIND Standing upright walker x 1  DX M54.40, M19.90 1 each 0   No current facility-administered medications for this visit.   Allergies:  Penicillins, Prednisone, and Propoxyphene n-acetaminophen   Social History: The patient  reports that she has never smoked. She has never used smokeless tobacco. She reports that she does not drink alcohol and does not use drugs.   Family History: The patient's family history includes ADD / ADHD in her grandchild; Allergies in her daughter; Bipolar disorder in her daughter and grandchild; Breast cancer (age of onset: 59) in her daughter; Diabetes in her sister; Edema in her daughter; Heart disease in her daughter; Keloids in her brother; Kidney disease in her father, son, and son; Lung cancer (age of onset: 30) in her mother; Neuropathy in her son; Seizures in her daughter.   ROS:  Please see the history of present illness. Otherwise, complete review of systems is positive for none.  All other systems are reviewed and negative.   Physical Exam: VS:  BP (!) 128/56   Pulse 78   Ht 5' 8"  (1.727 m)   Wt 257 lb 12.8 oz (116.9 kg)   SpO2 96%   BMI 39.20 kg/m , BMI Body mass index is 39.2 kg/m.  Wt Readings from Last 3 Encounters:  05/17/20 257 lb  12.8 oz (116.9 kg)  05/09/20 270 lb 12.8 oz (122.8 kg)  03/14/20 271 lb (122.9 kg)    General: Obese patient appears comfortable at rest. Neck: Supple, no elevated JVP or carotid bruits, no thyromegaly. Lungs: Clear to auscultation, nonlabored breathing at rest. Cardiac: Regular rate and rhythm, no S3 or significant systolic murmur, no pericardial rub. Extremities: Mild non pitting edema, distal pulses 2+. Skin: Warm and dry. Musculoskeletal: No kyphosis. Neuropsychiatric: Alert and oriented x3, affect grossly appropriate.  ECG:  05/17/2020 NSR rate of 78  Recent Labwork: 12/01/2019: ALT 16; AST 14; BUN 29; Creat 1.30; Hemoglobin 8.7; Platelets 177; Potassium 4.9; Sodium 138; TSH 2.24     Component Value Date/Time   CHOL 149 12/01/2019 1137   TRIG 100 12/01/2019 1137   HDL 58 12/01/2019 1137   CHOLHDL 2.6 12/01/2019 1137   VLDL 16 05/14/2017 1039   LDLCALC 72 12/01/2019 1137    Other Studies Reviewed Today: Diagnostic Studies 01/2013 echo Study Conclusions  - Left ventricle: The cavity size was normal. There was mild concentric hypertrophy. Systolic function was normal. The estimated ejection fraction was in the range of 60% to 65%. Wall motion was normal; there were no regional wall motion abnormalities. - Aortic valve: Mildly calcified annulus. - Right ventricle: The cavity size was normal. Wall thickness was mildly increased. - Atrial septum: No defect or patent foramen ovale was identified.  01/2013 MPI IMPRESSION: Probably negative pharmacologic stress nuclear myocardial study revealing normal left ventricular size, normal left ventricular systolic function and no significant stress-induced EKG abnormalities. On scintigraphic imaging, there was a small and mild defect that likely represents variable breast attenuation; however, the possibility of modest anterior ischemia cannot be unequivocally excluded.  01/2013 Event monitor No significant  arrhythmias  12/2014 ABIs: R 1.10 L 1.15  02/2015 Carotid US 1-39% bilateral stenosis    Assessment and Plan:  1. Bilateral lower extremity edema   2. Essential hypertension   3. Mixed hyperlipidemia   4.  Type 2 diabetes mellitus without complication, without long-term current use of insulin ([place])    1. Bilateral lower extremity edema Continue with Mild chronic LE edema. Continue Torsemide 40  Mg po daily  2. Essential hypertension BP well controlled on current therapy. Continue Amlodipine 10 mg daily, Benazepril 20 mg po daily.  3. Mixed hyperlipidemia Continue atorvastatin 40 mg daily  4. Type 2 diabetes mellitus without complication, without long-term current use of insulin ([place]) Recent presentation to [facility] for AMS and low BS. Received IV D50 en route to hospital where initial glucose was low at 63. It later improved with IVF with dextrose. Patient was discharged after improvement and at patient's request.   Medication Adjustments/Labs and Tests Ordered: Current medicines are reviewed at length with the patient today.  Concerns regarding medicines are outlined above.   Disposition: Follow-up with Dr Harl Bowie 6 months  Signed, Levell July, NP 05/17/2020 2:54 PM    [facility] at [address], [place], [place] [address] Phone: [phone]; Fax: [phone]

## 2020-05-17 NOTE — Patient Instructions (Addendum)

## 2020-05-18 ENCOUNTER — Encounter: Payer: Self-pay | Admitting: Family Medicine

## 2020-05-18 ENCOUNTER — Telehealth: Payer: Self-pay | Admitting: Family Medicine

## 2020-05-18 NOTE — Telephone Encounter (Signed)
Spoke with [name] and she states there is no way she can bring in a urine specimen for Lue nor take her to the lab because she cannot lift her. I did tell her you wanted an ov in the next 1-2 weeks. She did schedule that appt.

## 2020-05-18 NOTE — Telephone Encounter (Signed)
pLs call pt and  daughter who has been calling re her mom.  pls request that a CCUA be brought in the office by Friday  if abn I will rx antibiotic and send for c/S , otherwise if unable to bring specimen to office needs to take to lab, ( [facility]) I will follow this up.  Please also encourage schedule to  f/u in office in next 1 to 3 weeks

## 2020-05-23 ENCOUNTER — Ambulatory Visit (INDEPENDENT_AMBULATORY_CARE_PROVIDER_SITE_OTHER): Payer: Medicare HMO | Admitting: Family Medicine

## 2020-05-23 ENCOUNTER — Other Ambulatory Visit: Payer: Self-pay | Admitting: Endocrinology

## 2020-05-23 ENCOUNTER — Other Ambulatory Visit: Payer: Self-pay

## 2020-05-23 ENCOUNTER — Encounter: Payer: Self-pay | Admitting: Family Medicine

## 2020-05-23 VITALS — BP 109/66 | HR 87 | Resp 16 | Ht 68.0 in | Wt 270.0 lb

## 2020-05-23 DIAGNOSIS — E1165 Type 2 diabetes mellitus with hyperglycemia: Secondary | ICD-10-CM

## 2020-05-23 DIAGNOSIS — R400 Somnolence: Secondary | ICD-10-CM

## 2020-05-23 DIAGNOSIS — Z794 Long term (current) use of insulin: Secondary | ICD-10-CM

## 2020-05-23 DIAGNOSIS — R0989 Other specified symptoms and signs involving the circulatory and respiratory systems: Secondary | ICD-10-CM | POA: Diagnosis not present

## 2020-05-23 DIAGNOSIS — R4182 Altered mental status, unspecified: Secondary | ICD-10-CM

## 2020-05-23 DIAGNOSIS — I10 Essential (primary) hypertension: Secondary | ICD-10-CM | POA: Diagnosis not present

## 2020-05-23 MED ORDER — AMLODIPINE BESYLATE 5 MG PO TABS: 5.0000 mg | ORAL_TABLET | Freq: Every day | ORAL | 3 refills | Status: DC

## 2020-05-23 NOTE — Patient Instructions (Addendum)
F/u in  5 weeks, call if you need me sooner  You are referred for brain scan, carotid dioppler study to evaluate blood flow to brain and also to Neurology  New lower dose of amlodipine is 5 mg  DaILY, STOP THE AMLODIPINE 10 MG DAILY DOSE as blood pressure is low  PLEASE ENSURE DAUGHTER WITH HER IS LISTED ON DPR  Please schedule appt with endo as soon as possible due to fluctuating blood sugars 5 days of antibiotic , nitrofurantoin prescribed for presumed UTI, best if you can provide a specimen, ok to return with it if able  Careful not to fall

## 2020-05-23 NOTE — Progress Notes (Signed)
   Laura Atkins     MRN: [id number]      DOB: [birth year]   HPI Laura Atkins is here for follow upof recent eD visit on 05/10/2020 when she was diagnosed with hypoglycemia. Daughter is here and has record showing marked fluctuations in blood sugar over short periods of time. Does have upcoming Endo apendo appt. Concern is that pt has for approximately 4 weeks exhibited excessive somnolence and has had a change in alerness, speech and behavior, very concerning for cVA. She has had a stroke in the past. She is incapable of caring for herself independently currently and is a   ROS Denies recent fever or chills. Denies sinus pressure, nasal congestion, ear pain or sore throat. Denies chest congestion, productive cough or wheezing. Denies chest pains, palpitations and leg swelling Denies abdominal pain, nausea, vomiting,diarrhea or constipation.   c/o chronic  joint pain, swelling and limitation in mobility.Needs a walker for safe ambulation . Denies skin break down or rash.   PE  BP 109/66   Pulse 87   Resp 16   Ht 5\' 8"  (1.727 m)   Wt 270 lb (122.5 kg)   SpO2 93%   BMI 41.05 kg/m   Patient somnoolent and is  not very verbal which is how she has been in the past and in no cardiopulmonary distress.  HEENT: No facial asymmetry, EOMI,     Neck decrased rOM.Bruit  Chest: Clear to auscultation bilaterally.  CVS: S1, S2 no murmurs, no S3.Regular rate.  ABD: Soft non tender.   Ext: No edema  MS: decreased  ROM spine, shoulders, hips and knees.  Skin: Intact, no ulcerations or rash noted.  Psych:poor  eye contact, normal affect. Memory intact not anxious or depressed appearing.  CNS: CN 2-12 intact, power,  Grade 4 power in left upper and lower extrmeities and reduced sensation in left upper and lower extremities   Assessment & Plan  Altered mental status 4 week h/o reduced reduced mental status, less interactive, less verbal, increased weakness in upper and lower extremities,  past h/o CVA  Carotid bruit AMS and bila bruit, need carotid doppler  Essential hypertension Controlled, no change in medication   Type 2 diabetes mellitus with hyperglycemia (HCC) Uncontrolled with fluctuating blood sugars and hypoglycemia. Need to work with Endo closely

## 2020-05-26 ENCOUNTER — Ambulatory Visit: Payer: Medicare HMO | Admitting: Family Medicine

## 2020-05-26 ENCOUNTER — Encounter: Payer: Self-pay | Admitting: Family Medicine

## 2020-05-26 DIAGNOSIS — R0989 Other specified symptoms and signs involving the circulatory and respiratory systems: Secondary | ICD-10-CM | POA: Insufficient documentation

## 2020-05-26 NOTE — Assessment & Plan Note (Signed)
AMS and bila bruit, need carotid doppler

## 2020-05-26 NOTE — Assessment & Plan Note (Signed)
4 week h/o reduced reduced mental status, less interactive, less verbal, increased weakness in upper and lower extremities, past h/o CVA

## 2020-05-26 NOTE — Assessment & Plan Note (Signed)
Controlled, no change in medication  

## 2020-05-26 NOTE — Assessment & Plan Note (Signed)
Uncontrolled with fluctuating blood sugars and hypoglycemia. Need to work with Endo closely

## 2020-05-27 ENCOUNTER — Inpatient Hospital Stay (HOSPITAL_COMMUNITY)
Admission: EM | Admit: 2020-05-27 | Discharge: 2020-06-04 | DRG: 689 | Disposition: A | Discharge status: Skilled Nursing Facility | Payer: Medicare HMO | Attending: Internal Medicine | Admitting: Internal Medicine

## 2020-05-27 ENCOUNTER — Other Ambulatory Visit: Payer: Self-pay

## 2020-05-27 ENCOUNTER — Emergency Department (HOSPITAL_COMMUNITY): Payer: Medicare HMO

## 2020-05-27 ENCOUNTER — Encounter (HOSPITAL_COMMUNITY): Payer: Self-pay | Admitting: Emergency Medicine

## 2020-05-27 DIAGNOSIS — R569 Unspecified convulsions: Secondary | ICD-10-CM

## 2020-05-27 DIAGNOSIS — F419 Anxiety disorder, unspecified: Secondary | ICD-10-CM | POA: Diagnosis present

## 2020-05-27 DIAGNOSIS — G40909 Epilepsy, unspecified, not intractable, without status epilepticus: Secondary | ICD-10-CM | POA: Diagnosis not present

## 2020-05-27 DIAGNOSIS — G4733 Obstructive sleep apnea (adult) (pediatric): Secondary | ICD-10-CM | POA: Diagnosis not present

## 2020-05-27 DIAGNOSIS — N179 Acute kidney failure, unspecified: Secondary | ICD-10-CM

## 2020-05-27 DIAGNOSIS — Z79899 Other long term (current) drug therapy: Secondary | ICD-10-CM

## 2020-05-27 DIAGNOSIS — Z841 Family history of disorders of kidney and ureter: Secondary | ICD-10-CM

## 2020-05-27 DIAGNOSIS — G9341 Metabolic encephalopathy: Secondary | ICD-10-CM | POA: Diagnosis not present

## 2020-05-27 DIAGNOSIS — R4189 Other symptoms and signs involving cognitive functions and awareness: Secondary | ICD-10-CM | POA: Diagnosis not present

## 2020-05-27 DIAGNOSIS — E1165 Type 2 diabetes mellitus with hyperglycemia: Secondary | ICD-10-CM | POA: Diagnosis not present

## 2020-05-27 DIAGNOSIS — D509 Iron deficiency anemia, unspecified: Secondary | ICD-10-CM | POA: Diagnosis present

## 2020-05-27 DIAGNOSIS — Z794 Long term (current) use of insulin: Secondary | ICD-10-CM

## 2020-05-27 DIAGNOSIS — E611 Iron deficiency: Secondary | ICD-10-CM | POA: Diagnosis not present

## 2020-05-27 DIAGNOSIS — R0902 Hypoxemia: Secondary | ICD-10-CM | POA: Diagnosis not present

## 2020-05-27 DIAGNOSIS — I10 Essential (primary) hypertension: Secondary | ICD-10-CM

## 2020-05-27 DIAGNOSIS — Z9114 Patient's other noncompliance with medication regimen: Secondary | ICD-10-CM

## 2020-05-27 DIAGNOSIS — R441 Visual hallucinations: Secondary | ICD-10-CM | POA: Diagnosis not present

## 2020-05-27 DIAGNOSIS — Z713 Dietary counseling and surveillance: Secondary | ICD-10-CM

## 2020-05-27 DIAGNOSIS — E1149 Type 2 diabetes mellitus with other diabetic neurological complication: Secondary | ICD-10-CM | POA: Diagnosis present

## 2020-05-27 DIAGNOSIS — E1169 Type 2 diabetes mellitus with other specified complication: Secondary | ICD-10-CM | POA: Diagnosis not present

## 2020-05-27 DIAGNOSIS — I129 Hypertensive chronic kidney disease with stage 1 through stage 4 chronic kidney disease, or unspecified chronic kidney disease: Secondary | ICD-10-CM | POA: Diagnosis not present

## 2020-05-27 DIAGNOSIS — E1122 Type 2 diabetes mellitus with diabetic chronic kidney disease: Secondary | ICD-10-CM | POA: Diagnosis present

## 2020-05-27 DIAGNOSIS — B9620 Unspecified Escherichia coli [E. coli] as the cause of diseases classified elsewhere: Secondary | ICD-10-CM | POA: Diagnosis present

## 2020-05-27 DIAGNOSIS — R404 Transient alteration of awareness: Secondary | ICD-10-CM | POA: Diagnosis not present

## 2020-05-27 DIAGNOSIS — Z6841 Body Mass Index (BMI) 40.0 and over, adult: Secondary | ICD-10-CM | POA: Diagnosis not present

## 2020-05-27 DIAGNOSIS — R4182 Altered mental status, unspecified: Secondary | ICD-10-CM | POA: Diagnosis present

## 2020-05-27 DIAGNOSIS — K219 Gastro-esophageal reflux disease without esophagitis: Secondary | ICD-10-CM | POA: Diagnosis not present

## 2020-05-27 DIAGNOSIS — R627 Adult failure to thrive: Secondary | ICD-10-CM | POA: Diagnosis not present

## 2020-05-27 DIAGNOSIS — E785 Hyperlipidemia, unspecified: Secondary | ICD-10-CM | POA: Diagnosis not present

## 2020-05-27 DIAGNOSIS — Z87440 Personal history of urinary (tract) infections: Secondary | ICD-10-CM

## 2020-05-27 DIAGNOSIS — Z20822 Contact with and (suspected) exposure to covid-19: Secondary | ICD-10-CM | POA: Diagnosis present

## 2020-05-27 DIAGNOSIS — E1142 Type 2 diabetes mellitus with diabetic polyneuropathy: Secondary | ICD-10-CM | POA: Diagnosis present

## 2020-05-27 DIAGNOSIS — Z886 Allergy status to analgesic agent status: Secondary | ICD-10-CM

## 2020-05-27 DIAGNOSIS — E1151 Type 2 diabetes mellitus with diabetic peripheral angiopathy without gangrene: Secondary | ICD-10-CM | POA: Diagnosis not present

## 2020-05-27 DIAGNOSIS — I6782 Cerebral ischemia: Secondary | ICD-10-CM | POA: Diagnosis not present

## 2020-05-27 DIAGNOSIS — F329 Major depressive disorder, single episode, unspecified: Secondary | ICD-10-CM | POA: Diagnosis present

## 2020-05-27 DIAGNOSIS — Z880 Allergy status to penicillin: Secondary | ICD-10-CM

## 2020-05-27 DIAGNOSIS — B999 Unspecified infectious disease: Secondary | ICD-10-CM

## 2020-05-27 DIAGNOSIS — D560 Alpha thalassemia: Secondary | ICD-10-CM | POA: Diagnosis present

## 2020-05-27 DIAGNOSIS — Z833 Family history of diabetes mellitus: Secondary | ICD-10-CM

## 2020-05-27 DIAGNOSIS — N1831 Chronic kidney disease, stage 3a: Secondary | ICD-10-CM | POA: Diagnosis present

## 2020-05-27 DIAGNOSIS — R55 Syncope and collapse: Secondary | ICD-10-CM | POA: Diagnosis not present

## 2020-05-27 DIAGNOSIS — Z781 Physical restraint status: Secondary | ICD-10-CM

## 2020-05-27 DIAGNOSIS — Z882 Allergy status to sulfonamides status: Secondary | ICD-10-CM

## 2020-05-27 DIAGNOSIS — G9389 Other specified disorders of brain: Secondary | ICD-10-CM | POA: Diagnosis not present

## 2020-05-27 DIAGNOSIS — Z7982 Long term (current) use of aspirin: Secondary | ICD-10-CM

## 2020-05-27 DIAGNOSIS — I672 Cerebral atherosclerosis: Secondary | ICD-10-CM | POA: Diagnosis not present

## 2020-05-27 DIAGNOSIS — Z9111 Patient's noncompliance with dietary regimen: Secondary | ICD-10-CM

## 2020-05-27 DIAGNOSIS — N390 Urinary tract infection, site not specified: Principal | ICD-10-CM | POA: Diagnosis present

## 2020-05-27 DIAGNOSIS — G319 Degenerative disease of nervous system, unspecified: Secondary | ICD-10-CM | POA: Diagnosis not present

## 2020-05-27 DIAGNOSIS — R9082 White matter disease, unspecified: Secondary | ICD-10-CM | POA: Diagnosis not present

## 2020-05-27 DIAGNOSIS — Z9049 Acquired absence of other specified parts of digestive tract: Secondary | ICD-10-CM

## 2020-05-27 DIAGNOSIS — I4891 Unspecified atrial fibrillation: Secondary | ICD-10-CM | POA: Diagnosis not present

## 2020-05-27 DIAGNOSIS — Z90710 Acquired absence of both cervix and uterus: Secondary | ICD-10-CM

## 2020-05-27 LAB — COMPREHENSIVE METABOLIC PANEL
ALT: 25 U/L (ref 0–44)
AST: 26 U/L (ref 15–41)
Albumin: 4.2 g/dL (ref 3.5–5.0)
Alkaline Phosphatase: 95 U/L (ref 38–126)
Anion gap: 12 (ref 5–15)
BUN: 64 mg/dL — ABNORMAL HIGH (ref 8–23)
CO2: 27 mmol/L (ref 22–32)
Calcium: 9.6 mg/dL (ref 8.9–10.3)
Chloride: 97 mmol/L — ABNORMAL LOW (ref 98–111)
Creatinine, Ser: 2.71 mg/dL — ABNORMAL HIGH (ref 0.44–1.00)
GFR calc Af Amer: 19 mL/min — ABNORMAL LOW (ref >60)
GFR calc non Af Amer: 16 mL/min — ABNORMAL LOW (ref >60)
Glucose, Bld: 264 mg/dL — ABNORMAL HIGH (ref 70–99)
Potassium: 4.8 mmol/L (ref 3.5–5.1)
Sodium: 136 mmol/L (ref 135–145)
Total Bilirubin: 0.4 mg/dL (ref 0.3–1.2)
Total Protein: 8.1 g/dL (ref 6.5–8.1)

## 2020-05-27 LAB — URINALYSIS, ROUTINE W REFLEX MICROSCOPIC
Bilirubin Urine: NEGATIVE
Glucose, UA: NEGATIVE mg/dL
Ketones, ur: NEGATIVE mg/dL
Nitrite: NEGATIVE
Protein, ur: NEGATIVE mg/dL
Specific Gravity, Urine: 1.01 (ref 1.005–1.030)
WBC, UA: >50 WBC/hpf — ABNORMAL HIGH (ref 0–5)
pH: 5 (ref 5.0–8.0)

## 2020-05-27 LAB — CBC WITH DIFFERENTIAL/PLATELET
Abs Immature Granulocytes: 0.02 10*3/uL (ref 0.00–0.07)
Basophils Absolute: 0 10*3/uL (ref 0.0–0.1)
Basophils Relative: 0 %
Eosinophils Absolute: 0.2 10*3/uL (ref 0.0–0.5)
Eosinophils Relative: 3 %
HCT: 37.3 % (ref 36.0–46.0)
Hemoglobin: 10.7 g/dL — ABNORMAL LOW (ref 12.0–15.0)
Immature Granulocytes: 0 %
Lymphocytes Relative: 23 %
Lymphs Abs: 1.5 10*3/uL (ref 0.7–4.0)
MCH: 20.6 pg — ABNORMAL LOW (ref 26.0–34.0)
MCHC: 28.7 g/dL — ABNORMAL LOW (ref 30.0–36.0)
MCV: 71.9 fL — ABNORMAL LOW (ref 80.0–100.0)
Monocytes Absolute: 0.4 10*3/uL (ref 0.1–1.0)
Monocytes Relative: 7 %
Neutro Abs: 4.4 10*3/uL (ref 1.7–7.7)
Neutrophils Relative %: 67 %
Platelets: 193 10*3/uL (ref 150–400)
RBC: 5.19 MIL/uL — ABNORMAL HIGH (ref 3.87–5.11)
RDW: 15.1 % (ref 11.5–15.5)
WBC: 6.5 10*3/uL (ref 4.0–10.5)
nRBC: 0 % (ref 0.0–0.2)

## 2020-05-27 LAB — TSH: TSH: 1.129 u[IU]/mL (ref 0.350–4.500)

## 2020-05-27 LAB — VITAMIN B12: Vitamin B-12: 458 pg/mL (ref 180–914)

## 2020-05-27 LAB — SARS CORONAVIRUS 2 BY RT PCR (HOSPITAL ORDER, PERFORMED IN ~~LOC~~ HOSPITAL LAB): SARS Coronavirus 2: NEGATIVE

## 2020-05-27 LAB — TROPONIN I (HIGH SENSITIVITY)
Troponin I (High Sensitivity): 6 ng/L (ref <18)
Troponin I (High Sensitivity): 5 ng/L (ref <18)

## 2020-05-27 LAB — AMMONIA: Ammonia: 30 umol/L (ref 9–35)

## 2020-05-27 LAB — MAGNESIUM: Magnesium: 2.4 mg/dL (ref 1.7–2.4)

## 2020-05-27 LAB — FOLATE: Folate: 10.8 ng/mL (ref >5.9)

## 2020-05-27 LAB — CBG MONITORING, ED: Glucose-Capillary: 268 mg/dL — ABNORMAL HIGH (ref 70–99)

## 2020-05-27 MED ORDER — SODIUM CHLORIDE 0.9 % IV BOLUS
500.0000 mL | Freq: Once | INTRAVENOUS | Status: AC
  Administered 2020-05-27: 500 mL via INTRAVENOUS

## 2020-05-27 MED ORDER — SODIUM CHLORIDE 0.9 % IV SOLN: INTRAVENOUS | Status: AC

## 2020-05-27 MED ORDER — INSULIN ASPART PROT & ASPART (70-30 MIX) 100 UNIT/ML ~~LOC~~ SUSP
20.0000 [IU] | Freq: Two times a day (BID) | SUBCUTANEOUS | Status: DC
  Administered 2020-05-29 – 2020-06-01 (×7): 20 [IU] via SUBCUTANEOUS
  Filled 2020-05-27 (×2): qty 10

## 2020-05-27 MED ORDER — PANTOPRAZOLE SODIUM 40 MG PO TBEC
40.0000 mg | DELAYED_RELEASE_TABLET | Freq: Every day | ORAL | Status: DC
  Administered 2020-05-27 – 2020-06-04 (×9): 40 mg via ORAL
  Filled 2020-05-27 (×9): qty 1

## 2020-05-27 MED ORDER — HEPARIN SODIUM (PORCINE) 5000 UNIT/ML IJ SOLN
5000.0000 [IU] | Freq: Three times a day (TID) | INTRAMUSCULAR | Status: DC
  Administered 2020-05-27 – 2020-06-04 (×23): 5000 [IU] via SUBCUTANEOUS
  Filled 2020-05-27 (×24): qty 1

## 2020-05-27 MED ORDER — ATORVASTATIN CALCIUM 40 MG PO TABS
40.0000 mg | ORAL_TABLET | Freq: Every day | ORAL | Status: DC
  Administered 2020-05-27 – 2020-06-04 (×9): 40 mg via ORAL
  Filled 2020-05-27 (×9): qty 1

## 2020-05-27 MED ORDER — AMLODIPINE BESYLATE 5 MG PO TABS
5.0000 mg | ORAL_TABLET | Freq: Every day | ORAL | Status: DC
  Administered 2020-05-27 – 2020-06-04 (×9): 5 mg via ORAL
  Filled 2020-05-27 (×8): qty 1

## 2020-05-27 MED ORDER — ASPIRIN EC 81 MG PO TBEC
81.0000 mg | DELAYED_RELEASE_TABLET | Freq: Every day | ORAL | Status: DC
  Administered 2020-05-27 – 2020-06-04 (×9): 81 mg via ORAL
  Filled 2020-05-27 (×9): qty 1

## 2020-05-27 MED ORDER — INSULIN ASPART 100 UNIT/ML ~~LOC~~ SOLN
0.0000 [IU] | Freq: Three times a day (TID) | SUBCUTANEOUS | Status: DC
  Administered 2020-05-29: 3 [IU] via SUBCUTANEOUS
  Administered 2020-05-30 – 2020-05-31 (×2): 1 [IU] via SUBCUTANEOUS

## 2020-05-27 MED ORDER — SODIUM CHLORIDE 0.9 % IV BOLUS
1000.0000 mL | Freq: Once | INTRAVENOUS | Status: DC
  Administered 2020-05-27: 1000 mL via INTRAVENOUS

## 2020-05-27 MED ORDER — AMLODIPINE BESYLATE 5 MG PO TABS
5.0000 mg | ORAL_TABLET | Freq: Every day | ORAL | Status: DC
  Filled 2020-05-27: qty 1

## 2020-05-27 NOTE — ED Provider Notes (Signed)
[facility] EMERGENCY DEPARTMENT Provider Note   CSN: [id number] Arrival date & time: 05/27/20  1236     History Chief Complaint  Patient presents with  . Seizures    Laura Atkins is a 79 y.o. female who presents to the ED via EMS for seizure like activity witnessed by family member. Per triage report pt's blood sugar noted to be elevated > 300; pt was given insulin at home however with EMS CBG still 388.  Pt states she is unsure what happened today. She has no complaints. Most of the history obtained by daughter; reports that for the past 2-3 weeks patient has been very fatigued and generally weak. She saw her PCP 2 weeks ago and was told to go immediately to the ED for altered mental status. Pt went to [facility] ED; sugars increased with D50 and IVF with dextrose. Labwork and CT Head reassuring however it appears they were planning to admit patient. Per chart it appears pt wanted to go home and so she was released. There are telephone encounters in the chart with PCP regarding continued confusion/generalized weakness and family was urged to take patient back tot he ED again however they instead had  PCP appointment on 08/30 for same; it appears an outpatient doppler carotid ultrasound and MRI Brain was ordered (have not been completed).   Daughter reports she went to check on her mother this morning to bring breakfast. She found her on the side of the bed sitting upright in a chair when per daughter pt began getting very sweaty/clammy and then had seizure like activity that lasted 3-4 minutes. Daughter reports pt seemed confused afterwards and about 5 minutes later daughter told her "mom you had a seizure" and the patient responded "I felt it." Daughter denies hx of seizures however it Is listed in pt's PMHx. It appears she had an EEG in 2013 as seen below. During this visit it is listed that pt is on keppra however no evidence of this during neurology visits afterwards. Pt last saw Dr.  Leta Baptist with [facility] in 2019 for neuropathy; no mention of seizures at that time. Pt does not have keppra on her medication list.   EEG 05/05/2012 Clinical Interpretation:  This routine EEG done with the patient awake and drowsy is abnormal.  Background activities were slightly too slow suggesting a mild encephalopathy of non-specific etiology.  However, the right hemisphere did seem to be slower than the left suggesting a structural or functional abnormality in that location.  The history is provided by the patient, the EMS personnel and a relative.       Past Medical History:  Diagnosis Date  . Alpha thalassemia trait 01/26/2010   02/2012: Nl CBC ex H&H-10.7/34.8, MCV-69   . Anemia   . Anxiety   . Anxiety and depression   . Cellulitis 05/09/2017  . Depression   . Diabetes mellitus   . Foot pain, right 04/30/2013  . GERD (gastroesophageal reflux disease)   . Headache(784.0)   . Hyperlipidemia   . Hypertension   . Iron deficiency 01/21/2017  . Microcytic anemia 01/26/2010   02/2012: Nl CBC ex H&H-10.7/34.8, MCV-69   . NECK PAIN, CHRONIC 10/21/2008   +chronic back pain    . Obesity   . Obstructive sleep apnea   . Osteoarthritis    Left knee; right shoulder; chronic neck and back pain  . Pruritus   . PVD (peripheral vascular disease) ([place]) 01/28/2014  . Seizures ([place])   . Shoulder  pain, right 04/14/2015  . Tremor    This started months ago after her seizure progressing to very poor hand writing  . Urinary incontinence   . UTI (urinary tract infection) 01/18/2013    Patient Active Problem List   Diagnosis Date Noted  . Carotid bruit 05/26/2020  . Hyperlipidemia associated with type 2 diabetes mellitus ([place]) 12/08/2019  . Iron deficiency 01/21/2017  . Vitamin D deficiency 10/15/2016  . Urinary incontinence 10/15/2016  . Limited mobility 05/27/2016  . Type 2 diabetes mellitus with hyperglycemia ([place]) 02/04/2016  . Sleep terror disorder 12/14/2015  . Fecal incontinence  12/13/2015  . Back pain of lumbar region with sciatica 04/15/2015  . Spondylosis, cervical, with myelopathy 04/14/2015  . OSA (obstructive sleep apnea) 04/10/2013  . Peripheral neuropathy 03/12/2013  . Hearing loss 11/06/2012  . Altered mental status 01/25/2012  . UNSTEADY GAIT 11/07/2010  . Alpha thalassemia ([place]) 01/26/2010  . Morbid obesity ([place]) 04/21/2008  . Anxiety and depression 04/21/2008  . Essential hypertension 04/21/2008  . GERD 04/21/2008  . Osteoarthritis of left knee 04/21/2008  . URINARY INCONTINENCE 04/21/2008    Past Surgical History:  Procedure Laterality Date  . ABDOMINAL HYSTERECTOMY    . BREAST EXCISIONAL BIOPSY     Left; cyst  . CATARACT EXTRACTION Right    12/2017  . CATARACT EXTRACTION W/ INTRAOCULAR LENS IMPLANT Left 09/07/2013  . CHOLECYSTECTOMY    . COLONOSCOPY    . COLONOSCOPY N/A 07/20/2015   Procedure: COLONOSCOPY;  Surgeon: Rogene Houston, MD;  Location: [facility] ENDO SUITE;  Service: Endoscopy;  Laterality: N/A;  930  . EYE SURGERY Left 09/07/2013   cataract     OB History   No obstetric history on file.     Family History  Problem Relation Age of Onset  . Lung cancer Mother 69  . Kidney disease Father   . Diabetes Sister   . Keloids Brother   . ADD / ADHD Grandchild   . Bipolar disorder Grandchild   . Bipolar disorder Daughter   . Seizures Daughter   . Heart disease Daughter   . Kidney disease Son   . Neuropathy Son   . Kidney disease Son   . Edema Daughter   . Breast cancer Daughter 24  . Allergies Daughter   . Alcohol abuse Neg Hx   . Drug abuse Neg Hx     Social History   Tobacco Use  . Smoking status: Never Smoker  . Smokeless tobacco: Never Used  Vaping Use  . Vaping Use: Never used  Substance Use Topics  . Alcohol use: No    Alcohol/week: 0.0 standard drinks  . Drug use: No    Home Medications Prior to Admission medications   Medication Sig Start Date End Date Taking? Authorizing Provider  Accu-Chek FastClix  Lancets MISC 1 each by Does not apply route 4 (four) times daily. Use to monitor glucose levels 4 times daily; E11.65 06/12/19   Renato Shin, MD  acetaminophen (EXTRA STRENGTH PAIN RELIEF) 500 MG tablet Take 500 mg by mouth every 6 (six) hours as needed for pain.    [provider]  Alcohol Swabs (B-D SINGLE USE SWABS REGULAR) PADS 1 each by Does not apply route 4 (four) times daily. Use to prep site for glucose monitoring 4 times daily; E11.65 06/12/19   Renato Shin, MD  amLODipine (NORVASC) 5 MG tablet Take 1 tablet (5 mg total) by mouth daily. 05/23/20   Fayrene Helper, MD  aspirin 81 MG  tablet Take 81 mg by mouth daily.      [provider]  atorvastatin (LIPITOR) 40 MG tablet TAKE 1 TABLET BY MOUTH EVERY DAY 04/13/20   Fayrene Helper, MD  benazepril (LOTENSIN) 20 MG tablet TAKE 1 TABLET BY MOUTH EVERY DAY 05/16/20   Fayrene Helper, MD  Blood Glucose Monitoring Suppl (ACCU-CHEK GUIDE ME) w/Device KIT 1 each by Does not apply route 4 (four) times daily. Use to monitor glucose levels 4 times daily; E11.65 06/12/19   Renato Shin, MD  buPROPion (WELLBUTRIN XL) 150 MG 24 hr tablet Take 1 tablet (150 mg total) by mouth every morning. 01/22/20   Cloria Spring, MD  Cholecalciferol 125 MCG (5000 UT) TABS Take 1 tablet by mouth daily.    [provider]  Continuous Blood Gluc Sensor (FREESTYLE LIBRE 14 DAY SENSOR) MISC 1 Device by Does not apply route every 14 (fourteen) days. E11.9; MUST KEEP APPT IN ORDER TO PROCESS FUTURE REFILL REQUESTS 05/05/20   Renato Shin, MD  cyclobenzaprine (FLEXERIL) 5 MG tablet Take one  Tablet by mouth at bedtime for 1 week, then as needed 02/23/20   Fayrene Helper, MD  diphenhydrAMINE (BENADRYL) 25 MG tablet Take 25 mg by mouth at bedtime.    [provider]  ferrous sulfate 325 (65 FE) MG tablet Take 1 tablet (325 mg total) by mouth daily with breakfast. 12/12/19   Fayrene Helper, MD  gabapentin (NEURONTIN) 400 MG  capsule TAKE ONE CAPSULE BY MOUTH EVERY MORNING and TAKE ONE CAPSULE AT NOON and TAKE TWO CAPSULES AT BEDTIME 05/16/20   Fayrene Helper, MD  glucose blood (ACCU-CHEK GUIDE) test strip 1 each by Other route 4 (four) times daily. 05/24/20   Renato Shin, MD  insulin NPH-regular Human (NOVOLIN 70/30 RELION) (70-30) 100 UNIT/ML injection Inject 60 Units into the skin daily with breakfast. 05/09/20   Renato Shin, MD  Insulin Syringe-Needle U-100 (INSULIN SYRINGE 1CC/31GX5/16") 31G X 5/16" 1 ML MISC 1 each by Does not apply route daily. Use to inject insulin daily; E11.65 06/12/19   Renato Shin, MD  Multiple Vitamin (MULTIVITAMIN) capsule Take 1 capsule by mouth daily.    [provider]  nitrofurantoin, macrocrystal-monohydrate, (MACROBID) 100 MG capsule Take 1 capsule (100 mg total) by mouth 2 (two) times daily. 05/23/20   Fayrene Helper, MD  omeprazole (PRILOSEC) 20 MG capsule TAKE ONE CAPSULE BY MOUTH EVERY DAY 04/13/20   Fayrene Helper, MD  potassium chloride SA (KLOR-CON) 20 MEQ tablet TAKE 1 TABLET BY MOUTH TWICE DAILY 12/17/19   Fayrene Helper, MD  sertraline (ZOLOFT) 100 MG tablet Take 1 tablet (100 mg total) by mouth daily. 01/22/20   Cloria Spring, MD  torsemide (DEMADEX) 20 MG tablet TAKE TWO TABLETS BY MOUTH EVERY DAY (STOP lasix) - NEEDS APPOINTMENT FOR MORE REFILLS - LAST SEEN 2018 05/05/20   Arnoldo Lenis, MD  UNABLE TO FIND Walker x 1  DX unsteady gait, osteoarthritis of left knee 02/28/17   Fayrene Helper, MD  UNABLE TO FIND Elevated [place] Seat x 1 DX: unsteady gait, back pain, osteoarthritis of left knee 02/28/17   Fayrene Helper, MD  UNABLE TO FIND Standing upright walker x 1  DX M54.40, M19.90 03/01/20   Fayrene Helper, MD    Allergies    Penicillins, Prednisone, Propoxyphene n-acetaminophen, and Sulfa antibiotics  Review of Systems   Review of Systems  Constitutional: Negative for fever.  Gastrointestinal: Negative for abdominal  pain,  nausea and vomiting.  Neurological: Positive for seizures.  Psychiatric/Behavioral: Positive for confusion.  All other systems reviewed and are negative.   Physical Exam Updated Vital Signs BP (!) 126/50 (BP Location: Left Arm)   Pulse 92   Temp 97.7 F (36.5 C) (Oral)   Resp 17   Ht _0  (1.727 m)   Wt 122.5 kg   SpO2 100%   BMI 41.05 kg/m   Physical Exam Vitals and nursing note reviewed.  Constitutional:      Appearance: She is not ill-appearing.     Comments: Frail elderly appearing female  HENT:     Head: Normocephalic and atraumatic.  Eyes:     Extraocular Movements: Extraocular movements intact.     Conjunctiva/sclera: Conjunctivae normal.     Pupils: Pupils are equal, round, and reactive to light.  Cardiovascular:     Rate and Rhythm: Normal rate and regular rhythm.     Pulses: Normal pulses.  Pulmonary:     Effort: Pulmonary effort is normal.     Breath sounds: Normal breath sounds. No wheezing, rhonchi or rales.  Abdominal:     Palpations: Abdomen is soft.     Tenderness: There is no abdominal tenderness. There is no guarding or rebound.  Musculoskeletal:     Cervical back: Neck supple.  Skin:    General: Skin is warm and dry.  Neurological:     Mental Status: She is alert.     Comments: CN 3-12 grossly intact A&O to person and place. Pt believes it is 1999. She cannot recall whom the president is. She is unsure regarding the situation.  GCS 15 Sensation and strength intact Coordination with finger-to-nose WNL Neg pronator drift     ED Results / Procedures / Treatments   Labs (all labs ordered are listed, but only abnormal results are displayed) Labs Reviewed  CBC WITH DIFFERENTIAL/PLATELET - Abnormal; Notable for the following components:      Result Value   RBC 5.19 (*)    Hemoglobin 10.7 (*)    MCV 71.9 (*)    MCH 20.6 (*)    MCHC 28.7 (*)    All other components within normal limits  COMPREHENSIVE METABOLIC PANEL - Abnormal; Notable for  the following components:   Chloride 97 (*)    Glucose, Bld 264 (*)    BUN 64 (*)    Creatinine, Ser 2.71 (*)    GFR calc non Af Amer 16 (*)    GFR calc Af Amer 19 (*)    All other components within normal limits  CBG MONITORING, ED - Abnormal; Notable for the following components:   Glucose-Capillary 268 (*)    All other components within normal limits  SARS CORONAVIRUS 2 BY RT PCR (HOSPITAL ORDER, [place] [facility])  MAGNESIUM  URINALYSIS, ROUTINE W REFLEX MICROSCOPIC  CBG MONITORING, ED  TROPONIN I (HIGH SENSITIVITY)  TROPONIN I (HIGH SENSITIVITY)    EKG None  Radiology DG Chest 1 View  Result Date: 05/27/2020 CLINICAL DATA:  Seizure EXAM: CHEST  1 VIEW COMPARISON:  05/10/2020 FINDINGS: Mild cardiomegaly, stable. Atherosclerotic calcification of the aortic knob. No focal airspace consolidation, pleural effusion, or pneumothorax. Degenerative changes of the shoulders, right worse than left. IMPRESSION: No active disease. Electronically Signed   By: Davina Poke D.O.   On: 05/27/2020 14:02   CT Head Wo Contrast  Result Date: 05/27/2020 CLINICAL DATA:  Seizure like activity.  Hyperglycemia. EXAM: CT HEAD WITHOUT CONTRAST TECHNIQUE:  Contiguous axial images were obtained from the base of the skull through the vertex without intravenous contrast. COMPARISON:  05/10/2020 FINDINGS: Brain: Age related atrophy. Chronic small-vessel ischemic changes of the hemispheric white matter. No sign of acute infarction, mass lesion, hemorrhage, hydrocephalus or extra-axial collection. Vascular: There is atherosclerotic calcification of the major vessels at the base of the brain. Skull: Negative Sinuses/Orbits: Clear/normal Other: None IMPRESSION: No acute or reversible finding. Age related atrophy. Chronic small-vessel ischemic changes of the hemispheric white matter. Electronically Signed   By: Nelson Chimes M.D.   On: 05/27/2020 13:58   MR BRAIN WO CONTRAST  Result Date:  05/27/2020 CLINICAL DATA:  Mental status change EXAM: MRI HEAD WITHOUT CONTRAST TECHNIQUE: Multiplanar, multiecho pulse sequences of the brain and surrounding structures were obtained without intravenous contrast. COMPARISON:  2013 FINDINGS: Motion artifact is present. Brain: There is no acute infarction or intracranial hemorrhage. There is no intracranial mass, mass effect, or edema. There is no hydrocephalus or extra-axial fluid collection. Prominence of the ventricles and sulci reflects generalized parenchymal volume loss similar to the prior study. Patchy and confluent areas of T2 hyperintensity in the supratentorial white matter are nonspecific but probably reflect similar moderate chronic microvascular ischemic changes. Vascular: Major vessel flow voids at the skull base are preserved. Skull and upper cervical spine: Normal marrow signal is preserved. Sinuses/Orbits: Paranasal sinuses are aerated. Bilateral lens replacements. Other: Sella is unremarkable.  Mastoid air cells are clear. IMPRESSION: No acute infarction, hemorrhage, or mass. Moderate chronic microvascular ischemic changes. Electronically Signed   By: Macy Mis M.D.   On: 05/27/2020 15:47    Procedures Procedures (including critical care time)  Medications Ordered in ED Medications  sodium chloride 0.9 % bolus 500 mL (500 mLs Intravenous New Bag/Given (Non-Interop) 05/27/20 1651)    ED Course  I have reviewed the triage vital signs and the nursing notes.  Pertinent labs & imaging results that were available during my care of the patient were reviewed by me and considered in my medical decision making (see chart for details).    MDM Rules/Calculators/A&P                          79 year old female who presents to the ED today for seizure-like activity that lasted 45 minutes.  Patient has history of seizures listed in her chart however daughter denies this.  It does appear she had an EEG done in 2013 as seen above.  States she  was on Keppra at that point however it does not appear she is on Keppra currently.  She has had visits with neurology in the past however there is no mention of seizures.  Daughter mentions the patient has also had altered mental status and generalized weakness for the past 3 weeks.  Seen at [facility] ED on 08 17 found to be hypoglycemic which was fixed with D50 as well as IV fluids with dextrose.  It appears that they wanted to admit patient at that time however patient wanted to go home and so she was released.  Patient saw her PCP on 08 30 for similar complaints and an outpatient MRI was ordered as well as a carotid Doppler study which have not been performed yet.  Daughter brings patient back to the ED today for seizure-like activity but would still like to address the altered mental status that has been ongoing for 3 weeks.  Sugars noted to be elevated at home, given 40 units of  insulin.  With EMS blood sugar was still elevated at 338 however on arrival to the ED sugar is 268.  On arrival to the ED patient is afebrile, nontachycardic nontachypneic.  She has no complaints.  She is alert and oriented to herself and to place.  She is unaware of the time or situation.  She has no obvious focal neuro deficits on exam today.  Appears generally weak throughout all her extremities however they appear equal.  We will plan for lab work including CBC, CMP, any exam, troponin.  Will check urinalysis for infection.  Will obtain chest x-ray to rule out infection.  Plan for CT head and then if no acute findings will plan for MRI brain given this was ordered in the outpatient setting.  If there are no abnormalities within lab work or imaging will likely plan to discuss with [facility] to have outpatient follow-up for seizure-like activity.  Discussed case with attending physician Dr. Roderic Palau who agrees with plan.   CT Head negative CXR negative CBC without leukocytosis. Hgb stable at 10.7.  CMP with creatinine 2.71  (1.14 at [facility] on 08/17). BUN 64. Have ordered small fluid bolus for patient. Pt will need to be admitted for AKI. Mag 2.4   MRI negative for stroke or other acute findings.  Troponin 6 --> 5; unremarkable. No concern for ACS Still awaiting U/A at this time to assess for infection  Labwork and imaging overall reassuring besides pt's AKI. It is likely that pt is slowly having decline s/2 failure to thrive. Daughter does report that for the past 2-3 weeks pt has seemed confused especially in the morning and is quite forgetful; pt may be experiencing signs of cognitive decline. She continues to rest in the room; daughter at bedside. Daughter states she appears much improved from earlier this morning prior to seizure like activity. Again there is question whether this is first time seizure or not however pt will need to be admitted regardless. Pt may benefit from EEG while in the hospital.   Discussed case with Dr. [facility] who agrees to evaluate patient for admission.   This note was prepared using Dragon voice recognition software and may include unintentional dictation errors due to the inherent limitations of voice recognition software.  Final Clinical Impression(s) / ED Diagnoses Final diagnoses:  AKI (acute kidney injury) ([place])  Failure to thrive in adult  Seizure-like activity [facility])    Rx / DC Orders ED Discharge Orders    None       Eustaquio Maize, PA-C 05/27/20 1726    Milton Ferguson, MD 05/31/20 [phone]

## 2020-05-27 NOTE — H&P (Addendum)
TRH H&P   Patient Demographics:    Laura Atkins, is a 79 y.o. female  MRN: [id number]   DOB - 09-Oct-1940  Admit Date - 05/27/2020  Outpatient Primary MD for the patient is Fayrene Helper, MD  Referring MD/NP/PA: Darl Householder  Patient coming from: home  Chief Complaint  Patient presents with  . Seizures      HPI:    Laura Atkins  is a 79 y.o. female, history of alpha thalassemia, anxiety, diabetes mellitus, GERD, hyperlipidemia, hypertension, seizures(mentioned in past medical history but no further details), and was brought to ED by her daughter secondary to concerns of seizure, all patient is extremely poor historian, cannot give any reliable history, so it had to be obtained from the daughter by phone, patient has been having issues with diabetes mellitus over last few weeks, usually uncontrolled, due to noncompliance with medication and diet, her insulin 70/30 has been increased to 60 units per daughter, but she did have hypoglycemic episodes few days ago she was compliant with this dose. -Patient had an episode resembling seizures, report daughter report to check on her mom, she was seeing her sitting at a chair at the edge of the bed, sweaty, clammy and confused, her CBG was 388, she gave her 20 units of insulin, ports mother had an episode of 65 seconds of generalized tonic-clonic seizures, eye rolling back,  no urinary or stool incontinence, she remains confused 5 minutes after that, where she regained baseline after that, poor historian, report she felt hot but she cannot give any further details.  As well patient was recently seen by cardiology for which she was recommended to continue her torsemide 40 mg oral daily due to lower extremity edema, this appears to be resolved currently. - in ED her work-up was significant for AKI with a creatinine of 2.7, baseline anemia with  hemoglobin of 10.7,  MRI brain with no acute findings.  [facility] consulted to admit    Review of systems:    In addition to the HPI above, patient is very poor historian No Fever-chills, daughter reports generalized weakness, progressive, and worsening mental status No Headache, No changes with Vision or hearing, possible episode of seizures No problems swallowing food or Liquids, No Chest pain, Cough or Shortness of Breath, No Abdominal pain, No Nausea or Vommitting, Bowel movements are regular, No Blood in stool or Urine, No dysuria, No new skin rashes or bruises, No new joints pains-aches,  No new weakness, tingling, numbness in any extremity, No recent weight gain or loss, No polyuria, polydypsia or polyphagia, No significant Mental Stressors.  A full 10 point Review of Systems was done, except as stated above, all other Review of Systems were negative.   With Past History of the following :    Past Medical History:  Diagnosis Date  . Alpha thalassemia trait 01/26/2010   02/2012: Nl CBC  ex H&H-10.7/34.8, MCV-69   . Anemia   . Anxiety   . Anxiety and depression   . Cellulitis 05/09/2017  . Depression   . Diabetes mellitus   . Foot pain, right 04/30/2013  . GERD (gastroesophageal reflux disease)   . Headache(784.0)   . Hyperlipidemia   . Hypertension   . Iron deficiency 01/21/2017  . Microcytic anemia 01/26/2010   02/2012: Nl CBC ex H&H-10.7/34.8, MCV-69   . NECK PAIN, CHRONIC 10/21/2008   +chronic back pain    . Obesity   . Obstructive sleep apnea   . Osteoarthritis    Left knee; right shoulder; chronic neck and back pain  . Pruritus   . PVD (peripheral vascular disease) ([place]) 01/28/2014  . Seizures ([place])   . Shoulder pain, right 04/14/2015  . Tremor    This started months ago after her seizure progressing to very poor hand writing  . Urinary incontinence   . UTI (urinary tract infection) 01/18/2013      Past Surgical History:  Procedure Laterality Date  .  ABDOMINAL HYSTERECTOMY    . BREAST EXCISIONAL BIOPSY     Left; cyst  . CATARACT EXTRACTION Right    12/2017  . CATARACT EXTRACTION W/ INTRAOCULAR LENS IMPLANT Left 09/07/2013  . CHOLECYSTECTOMY    . COLONOSCOPY    . COLONOSCOPY N/A 07/20/2015   Procedure: COLONOSCOPY;  Surgeon: Rogene Houston, MD;  Location: [facility];  Service: Endoscopy;  Laterality: N/A;  930  . EYE SURGERY Left 09/07/2013   cataract      Social History:     Social History   Tobacco Use  . Smoking status: Never Smoker  . Smokeless tobacco: Never Used  Substance Use Topics  . Alcohol use: No    Alcohol/week: 0.0 standard drinks      Family History :     Family History  Problem Relation Age of Onset  . Lung cancer Mother 50  . Kidney disease Father   . Diabetes Sister   . Keloids Brother   . ADD / ADHD Grandchild   . Bipolar disorder Grandchild   . Bipolar disorder Daughter   . Seizures Daughter   . Heart disease Daughter   . Kidney disease Son   . Neuropathy Son   . Kidney disease Son   . Edema Daughter   . Breast cancer Daughter 43  . Allergies Daughter   . Alcohol abuse Neg Hx   . Drug abuse Neg Hx       Home Medications:   Prior to Admission medications   Medication Sig Start Date End Date Taking? Authorizing Provider  Accu-Chek FastClix Lancets MISC 1 each by Does not apply route 4 (four) times daily. Use to monitor glucose levels 4 times daily; E11.65 06/12/19   Renato Shin, MD  acetaminophen (EXTRA STRENGTH PAIN RELIEF) 500 MG tablet Take 500 mg by mouth every 6 (six) hours as needed for pain.    [provider]  Alcohol Swabs (B-D SINGLE USE SWABS REGULAR) PADS 1 each by Does not apply route 4 (four) times daily. Use to prep site for glucose monitoring 4 times daily; E11.65 06/12/19   Renato Shin, MD  amLODipine (NORVASC) 5 MG tablet Take 1 tablet (5 mg total) by mouth daily. 05/23/20   Fayrene Helper, MD  aspirin 81 MG tablet Take 81 mg by mouth daily.       [provider]  atorvastatin (LIPITOR) 40 MG tablet TAKE 1 TABLET BY MOUTH  EVERY DAY 04/13/20   Fayrene Helper, MD  benazepril (LOTENSIN) 20 MG tablet TAKE 1 TABLET BY MOUTH EVERY DAY 05/16/20   Fayrene Helper, MD  Blood Glucose Monitoring Suppl (ACCU-CHEK GUIDE ME) w/Device KIT 1 each by Does not apply route 4 (four) times daily. Use to monitor glucose levels 4 times daily; E11.65 06/12/19   Renato Shin, MD  buPROPion (WELLBUTRIN XL) 150 MG 24 hr tablet Take 1 tablet (150 mg total) by mouth every morning. 01/22/20   Cloria Spring, MD  Cholecalciferol 125 MCG (5000 UT) TABS Take 1 tablet by mouth daily.    [provider]  Continuous Blood Gluc Sensor (FREESTYLE LIBRE 14 DAY SENSOR) MISC 1 Device by Does not apply route every 14 (fourteen) days. E11.9; MUST KEEP APPT IN ORDER TO PROCESS FUTURE REFILL REQUESTS 05/05/20   Renato Shin, MD  cyclobenzaprine (FLEXERIL) 5 MG tablet Take one  Tablet by mouth at bedtime for 1 week, then as needed 02/23/20   Fayrene Helper, MD  diphenhydrAMINE (BENADRYL) 25 MG tablet Take 25 mg by mouth at bedtime.    [provider]  ferrous sulfate 325 (65 FE) MG tablet Take 1 tablet (325 mg total) by mouth daily with breakfast. 12/12/19   Fayrene Helper, MD  gabapentin (NEURONTIN) 400 MG capsule TAKE ONE CAPSULE BY MOUTH EVERY MORNING and TAKE ONE CAPSULE AT NOON and TAKE TWO CAPSULES AT BEDTIME 05/16/20   Fayrene Helper, MD  glucose blood (ACCU-CHEK GUIDE) test strip 1 each by Other route 4 (four) times daily. 05/24/20   Renato Shin, MD  insulin NPH-regular Human (NOVOLIN 70/30 RELION) (70-30) 100 UNIT/ML injection Inject 60 Units into the skin daily with breakfast. 05/09/20   Renato Shin, MD  Insulin Syringe-Needle U-100 (INSULIN SYRINGE 1CC/31GX5/16") 31G X 5/16" 1 ML MISC 1 each by Does not apply route daily. Use to inject insulin daily; E11.65 06/12/19   Renato Shin, MD  Multiple Vitamin (MULTIVITAMIN) capsule Take 1  capsule by mouth daily.    [provider]  nitrofurantoin, macrocrystal-monohydrate, (MACROBID) 100 MG capsule Take 1 capsule (100 mg total) by mouth 2 (two) times daily. 05/23/20   Fayrene Helper, MD  omeprazole (PRILOSEC) 20 MG capsule TAKE ONE CAPSULE BY MOUTH EVERY DAY 04/13/20   Fayrene Helper, MD  potassium chloride SA (KLOR-CON) 20 MEQ tablet TAKE 1 TABLET BY MOUTH TWICE DAILY 12/17/19   Fayrene Helper, MD  sertraline (ZOLOFT) 100 MG tablet Take 1 tablet (100 mg total) by mouth daily. 01/22/20   Cloria Spring, MD  torsemide (DEMADEX) 20 MG tablet TAKE TWO TABLETS BY MOUTH EVERY DAY (STOP lasix) - NEEDS APPOINTMENT FOR MORE REFILLS - LAST SEEN 2018 05/05/20   Arnoldo Lenis, MD  UNABLE TO FIND Walker x 1  DX unsteady gait, osteoarthritis of left knee 02/28/17   Fayrene Helper, MD  UNABLE TO FIND Elevated [place] Seat x 1 DX: unsteady gait, back pain, osteoarthritis of left knee 02/28/17   Fayrene Helper, MD  UNABLE TO FIND Standing upright walker x 1  DX M54.40, M19.90 03/01/20   Fayrene Helper, MD     Allergies:     Allergies  Allergen Reactions  . Penicillins Shortness Of Breath, Itching and Rash  . Prednisone Shortness Of Breath, Itching and Rash  . Propoxyphene N-Acetaminophen Itching and Nausea And Vomiting  . Sulfa Antibiotics Itching and Nausea And Vomiting     Physical Exam:   Vitals  Blood pressure (!) 124/52, pulse 66, temperature 97.7 F (36.5 C), temperature source Oral, resp. rate 16, height _0  (1.727 m), weight 122.5 kg, SpO2 98 %.   1. General frail elderly female, l lying in bed in NAD,  2.  Patient is confused, inaccurate, even though she is awake alert x3 (but she could not recall hospital name, or month )  3. No F.N deficits, ALL C.Nerves Intact, Strength 5/5 all 4 extremities, Sensation intact all 4 extremities, Plantars down going.  4. Ears and Eyes appear Normal, Conjunctivae clear, PERRLA. Moist Oral  Mucosa.  5. Supple Neck, No JVD, No cervical lymphadenopathy appriciated, No Carotid Bruits.  6. Symmetrical Chest wall movement, Good air movement bilaterally, CTAB.  7. RRR, No Gallops, Rubs or Murmurs, No Parasternal Heave.  8. Positive Bowel Sounds, Abdomen Soft, No tenderness, No organomegaly appriciated,No rebound -guarding or rigidity.  9.  No Cyanosis, Normal Skin Turgor, No Skin Rash or Bruise.  10. Good muscle tone,  joints appear normal , no effusions, Normal ROM.  11. No Palpable Lymph Nodes in Neck or Axillae     Data Review:    CBC Recent Labs  Lab 05/27/20 1357  WBC 6.5  HGB 10.7*  HCT 37.3  PLT 193  MCV 71.9*  MCH 20.6*  MCHC 28.7*  RDW 15.1  LYMPHSABS 1.5  MONOABS 0.4  EOSABS 0.2  BASOSABS 0.0   ------------------------------------------------------------------------------------------------------------------  Chemistries  Recent Labs  Lab 05/27/20 1357  NA 136  K 4.8  CL 97*  CO2 27  GLUCOSE 264*  BUN 64*  CREATININE 2.71*  CALCIUM 9.6  MG 2.4  AST 26  ALT 25  ALKPHOS 95  BILITOT 0.4   ------------------------------------------------------------------------------------------------------------------ estimated creatinine clearance is 24.3 mL/min (A) (by C-G formula based on SCr of 2.71 mg/dL (H)). ------------------------------------------------------------------------------------------------------------------ No results for input(s): TSH, T4TOTAL, T3FREE, THYROIDAB in the last 72 hours.  Invalid input(s): FREET3  Coagulation profile No results for input(s): INR, PROTIME in the last 168 hours. ------------------------------------------------------------------------------------------------------------------- No results for input(s): DDIMER in the last 72 hours. -------------------------------------------------------------------------------------------------------------------  Cardiac Enzymes No results for input(s): CKMB, TROPONINI,  MYOGLOBIN in the last 168 hours.  Invalid input(s): CK ------------------------------------------------------------------------------------------------------------------ No results found for: BNP   ---------------------------------------------------------------------------------------------------------------  Urinalysis    Component Value Date/Time   COLORURINE YELLOW 08/19/2017 1628   APPEARANCEUR CLOUDY (A) 08/19/2017 1628   LABSPEC 1.016 08/19/2017 1628   PHURINE 5.0 08/19/2017 1628   GLUCOSEU 150 (A) 08/19/2017 1628   GLUCOSEU 500 (A) 08/12/2017 1132   HGBUR LARGE (A) 08/19/2017 1628   BILIRUBINUR negative 08/14/2019 1100   BILIRUBINUR neg 10/28/2017 1500   KETONESUR negative 08/14/2019 1100   KETONESUR NEGATIVE 08/19/2017 1628   PROTEINUR trace 10/28/2017 1500   PROTEINUR 100 (A) 08/19/2017 1628   UROBILINOGEN 0.2 08/14/2019 1100   UROBILINOGEN 0.2 08/12/2017 1132   NITRITE Negative 08/14/2019 1100   NITRITE neg 10/28/2017 1500   NITRITE NEGATIVE 08/19/2017 1628   LEUKOCYTESUR Large (3+) (A) 08/14/2019 1100    ----------------------------------------------------------------------------------------------------------------   Imaging Results:    DG Chest 1 View  Result Date: 05/27/2020 CLINICAL DATA:  Seizure EXAM: CHEST  1 VIEW COMPARISON:  05/10/2020 FINDINGS: Mild cardiomegaly, stable. Atherosclerotic calcification of the aortic knob. No focal airspace consolidation, pleural effusion, or pneumothorax. Degenerative changes of the shoulders, right worse than left. IMPRESSION: No active disease. Electronically Signed   By: Davina Poke D.O.   On: 05/27/2020 14:02   CT Head Wo Contrast  Result Date: 05/27/2020 CLINICAL  DATA:  Seizure like activity.  Hyperglycemia. EXAM: CT HEAD WITHOUT CONTRAST TECHNIQUE: Contiguous axial images were obtained from the base of the skull through the vertex without intravenous contrast. COMPARISON:  05/10/2020 FINDINGS: Brain: Age related  atrophy. Chronic small-vessel ischemic changes of the hemispheric white matter. No sign of acute infarction, mass lesion, hemorrhage, hydrocephalus or extra-axial collection. Vascular: There is atherosclerotic calcification of the major vessels at the base of the brain. Skull: Negative Sinuses/Orbits: Clear/normal Other: None IMPRESSION: No acute or reversible finding. Age related atrophy. Chronic small-vessel ischemic changes of the hemispheric white matter. Electronically Signed   By: Nelson Chimes M.D.   On: 05/27/2020 13:58   MR BRAIN WO CONTRAST  Result Date: 05/27/2020 CLINICAL DATA:  Mental status change EXAM: MRI HEAD WITHOUT CONTRAST TECHNIQUE: Multiplanar, multiecho pulse sequences of the brain and surrounding structures were obtained without intravenous contrast. COMPARISON:  2013 FINDINGS: Motion artifact is present. Brain: There is no acute infarction or intracranial hemorrhage. There is no intracranial mass, mass effect, or edema. There is no hydrocephalus or extra-axial fluid collection. Prominence of the ventricles and sulci reflects generalized parenchymal volume loss similar to the prior study. Patchy and confluent areas of T2 hyperintensity in the supratentorial white matter are nonspecific but probably reflect similar moderate chronic microvascular ischemic changes. Vascular: Major vessel flow voids at the skull base are preserved. Skull and upper cervical spine: Normal marrow signal is preserved. Sinuses/Orbits: Paranasal sinuses are aerated. Bilateral lens replacements. Other: Sella is unremarkable.  Mastoid air cells are clear. IMPRESSION: No acute infarction, hemorrhage, or mass. Moderate chronic microvascular ischemic changes. Electronically Signed   By: Macy Mis M.D.   On: 05/27/2020 15:47    My personal review of EKG: Rhythm NSR, Rate  90 /min, QTc 481   Assessment & Plan:    Active Problems:   Alpha thalassemia (HCC)   Essential hypertension   GERD   Altered mental  status   OSA (obstructive sleep apnea)   Type 2 diabetes mellitus with hyperglycemia (HCC)   Hyperlipidemia associated with type 2 diabetes mellitus ([place])   AKI (acute kidney injury) ([place])   AKI -Recent creatinine 1.14 at [facility] on 05/10/2020, currently elevated 2.7 on admission -This is most likely in the setting of volume depletion, with concurrent use of benazepril and torsemide. -Continue with gentle hydration, and hold nephrotoxic medications.  Acute encephalopathy -Is with an episode of 65 cm generalized tonic-clonic seizure by daughter description loss of consciousness, 5 minutes post ictal. -Discussed with neurology at [place], story not that typical for seizures, but he does recommend MRI and EEG, MRI has been obtained with no acute finding, EEG has been ordered, neurology report patient can remain in any pain, and results can be discussed with him by phone tomorrow regarding further recommendations, meanwhile she should be kept on seizure precautions, and she has another seizure she can be given as needed Ativan. -As well daughter report progressive decline over few weeks, and worsening mentation, will consult PT/OT, will check M01, folic acid and TSH. -Depression most likely resembles seizures, not syncope, but I will still obtain 2D echo especially with lower extremity edema in the past requiring torsemide.  Hypertension -Hold benazepril for now, blood pressure is elevated, will start on Norvasc  Diabetes mellitus -Appears to be significantly labile, recently elevated for which her insulin 70/30 was increased, where she had hypoglycemic episode after that. -A1c elevated at 9.3. -I will change her insulin 70/30 to twice daily  dosing, will change to 20 units twice daily instead of 60 units daily, will add sliding scale.  Hyperlipidemia -Continue with statin  GERD -Continue with PPI    DVT Prophylaxis   Lovenox   AM Labs Ordered, also please  review Full Orders  Family Communication: Admission, patients condition and plan of care including tests being ordered have been discussed with the patient and daughter by phone  who indicate understanding and agree with the plan and Code Status.  Code Status Full  Likely DC to  Pending PT  Condition GUARDED    Consults called: D/W neurology at [facility] by phone.    Admission status: inpatient    Time spent in minutes : 60 minutes   Phillips Climes M.D on 05/27/2020 at 6:57 PM   [facility] - Office  [phone]

## 2020-05-27 NOTE — ED Triage Notes (Signed)
Pt c/o of seizure like activity witnessed by a family member. Pt had a blood sugar over 300 and insulin was given at home. EMS reports CBG was still 388.

## 2020-05-28 ENCOUNTER — Encounter (HOSPITAL_COMMUNITY): Payer: Self-pay | Admitting: Internal Medicine

## 2020-05-28 ENCOUNTER — Inpatient Hospital Stay (HOSPITAL_COMMUNITY): Payer: Medicare HMO

## 2020-05-28 DIAGNOSIS — E785 Hyperlipidemia, unspecified: Secondary | ICD-10-CM

## 2020-05-28 DIAGNOSIS — K219 Gastro-esophageal reflux disease without esophagitis: Secondary | ICD-10-CM

## 2020-05-28 DIAGNOSIS — R55 Syncope and collapse: Secondary | ICD-10-CM

## 2020-05-28 DIAGNOSIS — E1165 Type 2 diabetes mellitus with hyperglycemia: Secondary | ICD-10-CM

## 2020-05-28 DIAGNOSIS — E1169 Type 2 diabetes mellitus with other specified complication: Secondary | ICD-10-CM

## 2020-05-28 DIAGNOSIS — Z794 Long term (current) use of insulin: Secondary | ICD-10-CM

## 2020-05-28 LAB — CBC
HCT: 34 % — ABNORMAL LOW (ref 36.0–46.0)
Hemoglobin: 9.9 g/dL — ABNORMAL LOW (ref 12.0–15.0)
MCH: 20.7 pg — ABNORMAL LOW (ref 26.0–34.0)
MCHC: 29.1 g/dL — ABNORMAL LOW (ref 30.0–36.0)
MCV: 71.1 fL — ABNORMAL LOW (ref 80.0–100.0)
Platelets: 184 10*3/uL (ref 150–400)
RBC: 4.78 MIL/uL (ref 3.87–5.11)
RDW: 15 % (ref 11.5–15.5)
WBC: 5.1 10*3/uL (ref 4.0–10.5)
nRBC: 0 % (ref 0.0–0.2)

## 2020-05-28 LAB — HEMOGLOBIN A1C
Hgb A1c MFr Bld: 9.5 % — ABNORMAL HIGH (ref 4.8–5.6)
Mean Plasma Glucose: 225.95 mg/dL

## 2020-05-28 LAB — ECHOCARDIOGRAM COMPLETE
Area-P 1/2: 2.91 cm2
Height: 68 in
S' Lateral: 2.59 cm
Weight: 4319.99 oz

## 2020-05-28 LAB — COMPREHENSIVE METABOLIC PANEL
ALT: 23 U/L (ref 0–44)
AST: 24 U/L (ref 15–41)
Albumin: 3.6 g/dL (ref 3.5–5.0)
Alkaline Phosphatase: 78 U/L (ref 38–126)
Anion gap: 9 (ref 5–15)
BUN: 47 mg/dL — ABNORMAL HIGH (ref 8–23)
CO2: 27 mmol/L (ref 22–32)
Calcium: 9.1 mg/dL (ref 8.9–10.3)
Chloride: 104 mmol/L (ref 98–111)
Creatinine, Ser: 1.78 mg/dL — ABNORMAL HIGH (ref 0.44–1.00)
GFR calc Af Amer: 32 mL/min — ABNORMAL LOW (ref >60)
GFR calc non Af Amer: 27 mL/min — ABNORMAL LOW (ref >60)
Glucose, Bld: 107 mg/dL — ABNORMAL HIGH (ref 70–99)
Potassium: 4.3 mmol/L (ref 3.5–5.1)
Sodium: 140 mmol/L (ref 135–145)
Total Bilirubin: 0.4 mg/dL (ref 0.3–1.2)
Total Protein: 7 g/dL (ref 6.5–8.1)

## 2020-05-28 LAB — CBG MONITORING, ED
Glucose-Capillary: 113 mg/dL — ABNORMAL HIGH (ref 70–99)
Glucose-Capillary: 187 mg/dL — ABNORMAL HIGH (ref 70–99)
Glucose-Capillary: 194 mg/dL — ABNORMAL HIGH (ref 70–99)

## 2020-05-28 MED ORDER — QUETIAPINE FUMARATE 25 MG PO TABS
12.5000 mg | ORAL_TABLET | Freq: Two times a day (BID) | ORAL | Status: DC
  Filled 2020-05-28: qty 1

## 2020-05-28 MED ORDER — SODIUM CHLORIDE 0.9 % IV SOLN: INTRAVENOUS | Status: AC

## 2020-05-28 MED ORDER — HALOPERIDOL LACTATE 5 MG/ML IJ SOLN
2.0000 mg | Freq: Four times a day (QID) | INTRAMUSCULAR | Status: DC | PRN
  Administered 2020-05-28 – 2020-05-31 (×3): 2 mg via INTRAVENOUS
  Filled 2020-05-28 (×4): qty 1

## 2020-05-28 MED ORDER — CIPROFLOXACIN IN D5W 400 MG/200ML IV SOLN: 400.0000 mg | Freq: Two times a day (BID) | INTRAVENOUS | Status: DC

## 2020-05-28 MED ORDER — SODIUM CHLORIDE 0.9 % IV SOLN
1.0000 g | INTRAVENOUS | Status: DC
  Administered 2020-05-28 – 2020-05-30 (×3): 1 g via INTRAVENOUS
  Filled 2020-05-28 (×3): qty 10

## 2020-05-28 MED ORDER — SODIUM CHLORIDE 0.9 % IV SOLN
1.0000 g | Freq: Once | INTRAVENOUS | Status: AC
  Administered 2020-05-28: 1 g via INTRAVENOUS
  Filled 2020-05-28: qty 1

## 2020-05-28 NOTE — Progress Notes (Signed)
*  PRELIMINARY RESULTS* Echocardiogram 2D Echocardiogram has been performed.  Samuel Germany 05/28/2020, 12:15 PM

## 2020-05-28 NOTE — ED Notes (Signed)
Pt has pulled out her IV  She is confused   AC in to speak with - bed prop not available until Monday per [facility]  Pt has just pulled out her catheter

## 2020-05-28 NOTE — ED Triage Notes (Signed)
Report to [place], RN [facility]

## 2020-05-28 NOTE — ED Notes (Signed)
Report to [facility]

## 2020-05-28 NOTE — Progress Notes (Addendum)
PROGRESS NOTE  Laura Atkins VVO:[id number] DOB: 1940-09-28 DOA: 05/27/2020 PCP: Fayrene Helper, MD   LOS: 1 day   Brief Narrative / Interim history: 79 year old female with history of alpha thalassemia, diabetes mellitus, hypertension, hyperlipidemia, was brought to the ED by the daughter due to concerns of patient having a seizure.  Daughter reports an episode of about a minute longer generalized tonic-clonic seizures, eye rolling back and remained confused 5 minutes after that.  Daughter reports that her sugar was around mid 300s around that time, she carries a log with the patient CBGs and her sugars usually are in the mid to upper 200s.  Daughter also mentions that over the last 3 weeks patient has not been herself, she has been confused, had poor appetite and sleeping most of the day, unable to walk without significant assistance.  Prior to 3 weeks ago patient has never had any memory problems, alert and oriented x4, ambulating with a walker, however daughter reports an episode of sundowning with her prior hospital stay.  She has been complaining of intermittent headaches but that is not unusual for her.  Daughter denies any prior history of seizures.  She is not drinking EtOH, she is not a smoker.  In the ED she was found to have acute kidney injury  Subjective / 24h Interval events: Patient is confused on my evaluation, very tangential in her answers, does not appear to be oriented to place, situation or time.  Assessment & Plan: Principal Problem AKI on chronic kidney disease stage IIIa -possibly in the setting of urinary tract infection as well as dehydration given poor p.o. intake.  Baseline creatinine 0.9-1.3, elevated at 2.7 on admission.  She has received IV fluid, creatinine improving to 1.7 today, continue IV fluids  Active Problems Acute metabolic encephalopathy, concern for seizure -based on description this does sound like a true seizure given reports of generalized  tonic-clonic movements followed by postictal confusion and lethargy which improved.  Case was discussed with neurology over at [facility], patient will need an EEG however we are unable to obtain an EEG here at [facility] for the next 3 days, will transfer patient and neurology will also consult on arrival.  Report given to Dr. Nevada Crane with hospitalist team over at [facility].  Continue seizure precautions  Urinary tract infection-urine appears quite cloudy, urinalysis showed some evidence of UTI, given confusion will start antibiotics and send urine cultures.  Essential hypertension-her benazepril has been on hold because of AKI, started on Norvasc, blood pressure better this morning, continue  Hyperlipidemia-continue statin  Hypertension -Hold benazepril for now, blood pressure was elevated on admission, started on Norvasc  Microcytic anemia-check anemia panel  Insulin-dependent diabetes mellitus-relatively labile CBGs at home however no significant hypoglycemic episodes, daughter keeps a very detailed log of her sugars which is extremely helpful.  There was no hypoglycemic episodes around the time of her seizure  CBG (last 3)  Recent Labs    05/27/20 1320 05/28/20 0818  GLUCAP 268* 113*    Scheduled Meds: . amLODipine  5 mg Oral Daily  . aspirin EC  81 mg Oral Daily  . atorvastatin  40 mg Oral Daily  . heparin  5,000 Units Subcutaneous Q8H  . insulin aspart  0-9 Units Subcutaneous TID WC  . insulin aspart protamine- aspart  20 Units Subcutaneous BID WC  . pantoprazole  40 mg Oral Daily   Continuous Infusions: . cefTRIAXone (ROCEPHIN)  IV     PRN Meds:.  Diet  Orders (From admission, onward)    Start     Ordered   05/27/20 1926  Diet heart healthy/carb modified Room service appropriate? Yes; Fluid consistency: Thin  Diet effective now       Question Answer Comment  Diet-HS Snack? Nothing   Room service appropriate? Yes   Fluid consistency: Thin      05/27/20 1925           DVT prophylaxis: heparin injection 5,000 Units Start: 05/27/20 2200 SCDs Start: 05/27/20 1926     Code Status: Full Code  Family Communication: daughter at bedside   Status is: Inpatient  Remains inpatient appropriate because:Altered mental status  Dispo: The patient is from: Home              Anticipated d/c is to: Home              Anticipated d/c date is: 2 days              Patient currently is not medically stable to d/c.  Consultants:  Neurology, Dr. Lorrin Goodell  Procedures:  2D echo: pending  Microbiology  Urine cultures - pending  Antimicrobials: Ceftriaxone 9/4 >>    Objective: Vitals:   05/28/20 0200 05/28/20 0400 05/28/20 0500 05/28/20 1100  BP: (!) 138/97 (!) 160/107 126/63 (!) 120/59  Pulse: 66 65 69   Resp: 16 16 16    Temp:      TempSrc:      SpO2: 100% 98% 97%   Weight:      Height:        Intake/Output Summary (Last 24 hours) at 05/28/2020 1146 Last data filed at 05/28/2020 0950 Gross per 24 hour  Intake 2600 ml  Output 1200 ml  Net 1400 ml   Filed Weights   05/27/20 1301  Weight: 122.5 kg    Examination:  Constitutional: NAD Eyes: no scleral icterus ENMT: Mucous membranes are moist.  Neck: normal, supple Respiratory: clear to auscultation bilaterally, no wheezing, no crackles.  Cardiovascular: Regular rate and rhythm, no murmurs / rubs / gallops. No LE edema. Abdomen: non distended, no tenderness. Bowel sounds positive.  Musculoskeletal: no clubbing / cyanosis.  Skin: no rashes Neurologic: CN 2-12 grossly intact. Strength 5/5 in all 4.  Alert to self  Data Reviewed: I have independently reviewed following labs and imaging studies   CBC: Recent Labs  Lab 05/27/20 1357 05/28/20 0419  WBC 6.5 5.1  NEUTROABS 4.4  --   HGB 10.7* 9.9*  HCT 37.3 34.0*  MCV 71.9* 71.1*  PLT 193 800   Basic Metabolic Panel: Recent Labs  Lab 05/27/20 1357 05/28/20 0419  NA 136 140  K 4.8 4.3  CL 97* 104  CO2 27 27  GLUCOSE 264* 107*   BUN 64* 47*  CREATININE 2.71* 1.78*  CALCIUM 9.6 9.1  MG 2.4  --    Liver Function Tests: Recent Labs  Lab 05/27/20 1357 05/28/20 0419  AST 26 24  ALT 25 23  ALKPHOS 95 78  BILITOT 0.4 0.4  PROT 8.1 7.0  ALBUMIN 4.2 3.6   Coagulation Profile: No results for input(s): INR, PROTIME in the last 168 hours. HbA1C: Recent Labs    05/27/20 1828  HGBA1C 9.5*   CBG: Recent Labs  Lab 05/27/20 1320 05/28/20 0818  GLUCAP 268* 113*    Recent Results (from the past 240 hour(s))  SARS Coronavirus 2 by RT PCR (hospital order, performed in [facility] hospital lab) Nasopharyngeal Nasopharyngeal Swab  Status: None   Collection Time: 05/27/20  7:31 PM   Specimen: Nasopharyngeal Swab  Result Value Ref Range Status   SARS Coronavirus 2 NEGATIVE NEGATIVE Final    Comment: (NOTE) SARS-CoV-2 target nucleic acids are NOT DETECTED.  The SARS-CoV-2 RNA is generally detectable in upper and lower respiratory specimens during the acute phase of infection. The lowest concentration of SARS-CoV-2 viral copies this assay can detect is 250 copies / mL. A negative result does not preclude SARS-CoV-2 infection and should not be used as the sole basis for treatment or other patient management decisions.  A negative result may occur with improper specimen collection / handling, submission of specimen other than nasopharyngeal swab, presence of viral mutation(s) within the areas targeted by this assay, and inadequate number of viral copies (<250 copies / mL). A negative result must be combined with clinical observations, patient history, and epidemiological information.  Fact Sheet for Patients:   StrictlyIdeas.no  Fact Sheet for Healthcare Providers: BankingDealers.co.za  This test is not yet approved or  cleared by the [place] FDA and has been authorized for detection and/or diagnosis of SARS-CoV-2 by FDA under an Emergency Use  Authorization (EUA).  This EUA will remain in effect (meaning this test can be used) for the duration of the COVID-19 declaration under Section 564(b)(1) of the Act, 21 U.S.C. section 360bbb-3(b)(1), unless the authorization is terminated or revoked sooner.  Performed at [facility], [address] [place], [place] [address]      Radiology Studies: DG Chest 1 View  Result Date: 05/27/2020 CLINICAL DATA:  Seizure EXAM: CHEST  1 VIEW COMPARISON:  05/10/2020 FINDINGS: Mild cardiomegaly, stable. Atherosclerotic calcification of the aortic knob. No focal airspace consolidation, pleural effusion, or pneumothorax. Degenerative changes of the shoulders, right worse than left. IMPRESSION: No active disease. Electronically Signed   By: Davina Poke D.O.   On: 05/27/2020 14:02   CT Head Wo Contrast  Result Date: 05/27/2020 CLINICAL DATA:  Seizure like activity.  Hyperglycemia. EXAM: CT HEAD WITHOUT CONTRAST TECHNIQUE: Contiguous axial images were obtained from the base of the skull through the vertex without intravenous contrast. COMPARISON:  05/10/2020 FINDINGS: Brain: Age related atrophy. Chronic small-vessel ischemic changes of the hemispheric white matter. No sign of acute infarction, mass lesion, hemorrhage, hydrocephalus or extra-axial collection. Vascular: There is atherosclerotic calcification of the major vessels at the base of the brain. Skull: Negative Sinuses/Orbits: Clear/normal Other: None IMPRESSION: No acute or reversible finding. Age related atrophy. Chronic small-vessel ischemic changes of the hemispheric white matter. Electronically Signed   By: Nelson Chimes M.D.   On: 05/27/2020 13:58   MR BRAIN WO CONTRAST  Result Date: 05/27/2020 CLINICAL DATA:  Mental status change EXAM: MRI HEAD WITHOUT CONTRAST TECHNIQUE: Multiplanar, multiecho pulse sequences of the brain and surrounding structures were obtained without intravenous contrast. COMPARISON:  2013 FINDINGS: Motion artifact is  present. Brain: There is no acute infarction or intracranial hemorrhage. There is no intracranial mass, mass effect, or edema. There is no hydrocephalus or extra-axial fluid collection. Prominence of the ventricles and sulci reflects generalized parenchymal volume loss similar to the prior study. Patchy and confluent areas of T2 hyperintensity in the supratentorial white matter are nonspecific but probably reflect similar moderate chronic microvascular ischemic changes. Vascular: Major vessel flow voids at the skull base are preserved. Skull and upper cervical spine: Normal marrow signal is preserved. Sinuses/Orbits: Paranasal sinuses are aerated. Bilateral lens replacements. Other: Sella is unremarkable.  Mastoid air cells are clear. IMPRESSION: No  acute infarction, hemorrhage, or mass. Moderate chronic microvascular ischemic changes. Electronically Signed   By: Macy Mis M.D.   On: 05/27/2020 15:47    Marzetta Board, MD, PhD [facility]  Between 7 am - 7 pm I am available, please contact me via Amion or Securechat  Between 7 pm - 7 am I am not available, please contact night coverage MD/APP via Amion

## 2020-05-29 ENCOUNTER — Inpatient Hospital Stay (HOSPITAL_COMMUNITY): Payer: Medicare HMO

## 2020-05-29 DIAGNOSIS — G4733 Obstructive sleep apnea (adult) (pediatric): Secondary | ICD-10-CM

## 2020-05-29 DIAGNOSIS — R4182 Altered mental status, unspecified: Secondary | ICD-10-CM

## 2020-05-29 DIAGNOSIS — R569 Unspecified convulsions: Secondary | ICD-10-CM

## 2020-05-29 LAB — CBC WITH DIFFERENTIAL/PLATELET
Abs Immature Granulocytes: 0.01 10*3/uL (ref 0.00–0.07)
Basophils Absolute: 0 10*3/uL (ref 0.0–0.1)
Basophils Relative: 0 %
Eosinophils Absolute: 0.2 10*3/uL (ref 0.0–0.5)
Eosinophils Relative: 5 %
HCT: 35.7 % — ABNORMAL LOW (ref 36.0–46.0)
Hemoglobin: 10.2 g/dL — ABNORMAL LOW (ref 12.0–15.0)
Immature Granulocytes: 0 %
Lymphocytes Relative: 36 %
Lymphs Abs: 1.8 10*3/uL (ref 0.7–4.0)
MCH: 20.2 pg — ABNORMAL LOW (ref 26.0–34.0)
MCHC: 28.6 g/dL — ABNORMAL LOW (ref 30.0–36.0)
MCV: 70.6 fL — ABNORMAL LOW (ref 80.0–100.0)
Monocytes Absolute: 0.3 10*3/uL (ref 0.1–1.0)
Monocytes Relative: 7 %
Neutro Abs: 2.5 10*3/uL (ref 1.7–7.7)
Neutrophils Relative %: 52 %
Platelets: 184 10*3/uL (ref 150–400)
RBC: 5.06 MIL/uL (ref 3.87–5.11)
RDW: 14.9 % (ref 11.5–15.5)
WBC: 4.9 10*3/uL (ref 4.0–10.5)
nRBC: 0 % (ref 0.0–0.2)

## 2020-05-29 LAB — GLUCOSE, CAPILLARY
Glucose-Capillary: 235 mg/dL — ABNORMAL HIGH (ref 70–99)
Glucose-Capillary: 167 mg/dL — ABNORMAL HIGH (ref 70–99)
Glucose-Capillary: 219 mg/dL — ABNORMAL HIGH (ref 70–99)
Glucose-Capillary: 237 mg/dL — ABNORMAL HIGH (ref 70–99)

## 2020-05-29 LAB — BASIC METABOLIC PANEL
Anion gap: 11 (ref 5–15)
BUN: 22 mg/dL (ref 8–23)
CO2: 27 mmol/L (ref 22–32)
Calcium: 9.6 mg/dL (ref 8.9–10.3)
Chloride: 104 mmol/L (ref 98–111)
Creatinine, Ser: 1.51 mg/dL — ABNORMAL HIGH (ref 0.44–1.00)
GFR calc Af Amer: 39 mL/min — ABNORMAL LOW (ref >60)
GFR calc non Af Amer: 33 mL/min — ABNORMAL LOW (ref >60)
Glucose, Bld: 223 mg/dL — ABNORMAL HIGH (ref 70–99)
Potassium: 4.3 mmol/L (ref 3.5–5.1)
Sodium: 142 mmol/L (ref 135–145)

## 2020-05-29 LAB — RAPID URINE DRUG SCREEN, HOSP PERFORMED
Amphetamines: NOT DETECTED
Barbiturates: NOT DETECTED
Benzodiazepines: NOT DETECTED
Cocaine: NOT DETECTED
Opiates: NOT DETECTED
Tetrahydrocannabinol: NOT DETECTED

## 2020-05-29 NOTE — Progress Notes (Signed)
EEG complete - results pending 

## 2020-05-29 NOTE — Procedures (Signed)
Patient Name: Laura Atkins  MRN: [id number]  Epilepsy Attending: Lora Havens  Referring Physician/Provider: DR Donnetta Simpers Date: 05/29/2020 Duration: 25.01 mins  Patient history: 79yo F with h/o seizures and ams. EEG to evaluate for seizure.   Level of alertness: Awake  AEDs during EEG study: None  Technical aspects: This EEG study was done with scalp electrodes positioned according to the 10-20 International system of electrode placement. Electrical activity was acquired at a sampling rate of 500Hz  and reviewed with a high frequency filter of 70Hz  and a low frequency filter of 1Hz . EEG data were recorded continuously and digitally stored.   Description: The posterior dominant rhythm consists of 7.5-8 Hz activity of moderate voltage (25-35 uV) seen predominantly in posterior head regions, symmetric and reactive to eye opening and eye closing. EEG showed continuous generalized polymorphic 3 to 6 Hz theta-delta slowing. Hyperventilation and photic stimulation were not performed.     ABNORMALITY -Continuous slow, generalized  IMPRESSION: This study is suggestive of mild diffuse encephalopathy, nonspecific etiology. No seizures or epileptiform discharges were seen throughout the recording.  Laura Atkins

## 2020-05-29 NOTE — Evaluation (Signed)
Physical Therapy Evaluation Patient Details Name: Laura Atkins MRN: [id number] DOB: Aug 28, 1941 Today's Date: 05/29/2020   History of Present Illness  79 y.o. female, history of alpha thalassemia, anxiety, diabetes mellitus, GERD, hyperlipidemia, hypertension, seizures(mentioned in past medical history but no further details), and was brought to ED 05/27/20 by her daughter secondary to concerns of seizure. AMS; Daughter stated that over the last 3 weeks patient has not been herself, she has been confused, had poor appetite and sleeping most of the day, unable to walk without significant assistance. Prior to that a&ox4 walking with walker. +AKI, encephalopathy, UTI, has been pulling out IVs, catheter  Clinical Impression   Pt admitted with above diagnosis. Per daughter, pt is much more alert today, however cognitively is still very disoriented (throughout session states she is in her home and needs to go to the grocery store). Patient has had significant decline in function over the past few weeks (ambulating modified independently with RW to +2 assist to transfer onto seat of rollator and rolled into bathroom for transfer onto toilet). Patient currently does not have 24/7 assist/supervision (husband has dementia) and daughter works. Although able to stand with min assist, she was not safe to walk away from EOB due to her confusion and unpredictable behavior.  Pt currently with functional limitations due to the deficits listed below (see PT Problem List). Pt will benefit from skilled PT to increase their independence and safety with mobility to allow discharge to the venue listed below.       Follow Up Recommendations SNF;Supervision/Assistance - 24 hour    Equipment Recommendations  Other (comment) (TBD if no SNF)    Recommendations for Other Services       Precautions / Restrictions Precautions Precautions: Fall;Other (comment) Precaution Comments: AMS      Mobility  Bed  Mobility Overal bed mobility: Needs Assistance Bed Mobility: Rolling;Sidelying to Sit;Sit to Sidelying Rolling: Min assist Sidelying to sit: Min assist;HOB elevated (HOB 20, rail)     Sit to sidelying: Max assist;+2 for physical assistance General bed mobility comments: incr time for pt to scoot out to EOB and get feet to floor; resisting returning to supine and required incr assist  Transfers Overall transfer level: Needs assistance Equipment used: Rolling walker (2 wheeled) Transfers: Sit to/from Stand Sit to Stand: Min assist;+2 physical assistance;+2 safety/equipment;From elevated surface (elevated bed to simulate home)         General transfer comment: daughter present and assisting with pt attending to task  Ambulation/Gait Ambulation/Gait assistance: Min assist;+2 safety/equipment Gait Distance (Feet): 2 Feet Assistive device: Rolling walker (2 wheeled)       General Gait Details: side-stepping to her right towards [facility]; pt attempting to walk away from bed, however due to cognition did not feel this was safe as she having difficulty following instructions  Stairs            Wheelchair Mobility    Modified Rankin (Stroke Patients Only)       Balance Overall balance assessment: Needs assistance Sitting-balance support: No upper extremity supported;Feet unsupported Sitting balance-Leahy Scale: Good Sitting balance - Comments: able to weight shift and alternately scoot rt/lt hips to get to EOB   Standing balance support: Single extremity supported Standing balance-Leahy Scale: Poor Standing balance comment: minimal use of UEs on RW, however does need support for safety  Pertinent Vitals/Pain Pain Assessment: Faces Faces Pain Scale: Hurts even more Pain Location: bil lower legs when assisting up onto bed Pain Descriptors / Indicators: Guarding;Moaning Pain Intervention(s): Limited activity within patient's tolerance     Home Living Family/patient expects to be discharged to:: Private residence Living Arrangements: Spouse/significant other;Children;Other relatives (daughter, grandaughter) Available Help at Discharge: Family;Available PRN/intermittently (daughter works Risk manager); grdtr online school) Type of Home: House Home Access: [place]: One [place]: Environmental consultant - 2 wheels;Walker - 4 wheels;Bedside commode;Grab bars - tub/shower (having step in tub installed) Additional Comments: daughter present and provided information    Prior Function Level of Independence: Independent with assistive device(s)         Comments: 3 weeks ago using RW modified independent     Hand Dominance        Extremity/Trunk Assessment   Upper Extremity Assessment Upper Extremity Assessment: Generalized weakness;Defer to OT evaluation    Lower Extremity Assessment Lower Extremity Assessment: Generalized weakness (AAROM WFL for body habitus (obese with tissue approximation))    Cervical / Trunk Assessment Cervical / Trunk Assessment: Other exceptions Cervical / Trunk Exceptions: morbid obesity  Communication   Communication: No difficulties  Cognition Arousal/Alertness: Awake/alert Behavior During Therapy: Restless Overall Cognitive Status: Impaired/Different from baseline Area of Impairment: Orientation;Attention;Memory;Following commands;Safety/judgement;Awareness                 Orientation Level: Place;Time;Situation Current Attention Level: Sustained Memory: Decreased recall of precautions;Decreased short-term memory Following Commands: Follows one step commands inconsistently Safety/Judgement: Decreased awareness of safety;Decreased awareness of deficits Awareness:  (pre-intellectual)   General Comments: Per daughter, AMS is improving but far from her baseline      General Comments General comments (skin integrity, edema, etc.): Daughter present  throughout    Exercises     Assessment/Plan    PT Assessment Patient needs continued PT services  PT Problem List Decreased strength;Decreased balance;Decreased mobility;Decreased cognition;Decreased knowledge of use of DME;Decreased safety awareness;Decreased knowledge of precautions;Obesity       PT Treatment Interventions DME instruction;Gait training;Functional mobility training;Therapeutic activities;Therapeutic exercise;Balance training;Cognitive remediation;Patient/family education    PT Goals (Current goals can be found in the Care Plan section)  Acute Rehab PT Goals Patient Stated Goal: pt unable; daughter wants pt to be able to walk safely PT Goal Formulation: With family Time For Goal Achievement: 06/12/20 Potential to Achieve Goals: Good    Frequency Min 2X/week   Barriers to discharge Decreased caregiver support daughter trying to arrange 24/7 care, but has been unsuccessful so far    Co-evaluation               AM-PAC PT "6 Clicks" Mobility  Outcome Measure Help needed turning from your back to your side while in a flat bed without using bedrails?: A Little Help needed moving from lying on your back to sitting on the side of a flat bed without using bedrails?: A Lot Help needed moving to and from a bed to a chair (including a wheelchair)?: A Lot Help needed standing up from a chair using your arms (e.g., wheelchair or bedside chair)?: A Lot Help needed to walk in hospital room?: A Lot Help needed climbing 3-5 steps with a railing? : Total 6 Click Score: 12    End of Session   Activity Tolerance: Patient tolerated treatment well Patient left: in bed;with nursing/sitter in room;with family/visitor present;with restraints reapplied (RN in to assist and will turn on alarm when finished with  pt) Nurse Communication: Mobility status;Other (comment) (strong enough for BSC, however do not trust her to [facility]) PT Visit Diagnosis: Muscle weakness (generalized)  (M62.81);Difficulty in walking, not elsewhere classified (R26.2)    Time: 0037-9444 PT Time Calculation (min) (ACUTE ONLY): 32 min   Charges:   PT Evaluation $PT Eval Low Complexity: 1 Low PT Treatments $Therapeutic Activity: 8-22 mins         Arby Barrette, PT Pager [phone]   Rexanne Mano 05/29/2020, 1:23 PM

## 2020-05-29 NOTE — Progress Notes (Addendum)
PROGRESS NOTE  Laura Atkins ZWC:[id number] DOB: [birth year] DOA: 05/27/2020 PCP: Fayrene Helper, MD   LOS: 2 days   Brief Narrative / Interim history: 79 year old female with history of alpha thalassemia, diabetes mellitus, hypertension, hyperlipidemia, was brought to the ED by the daughter due to concerns of patient having a seizure.  Daughter reports an episode of about a minute longer generalized tonic-clonic seizures, eye rolling back and remained confused 5 minutes after that.  Daughter reports that her sugar was around mid 300s around that time, she carries a log with the patient CBGs and her sugars usually are in the mid to upper 200s.  Daughter also mentions that over the last 3 weeks patient has not been herself, she has been confused, had poor appetite and sleeping most of the day, unable to walk without significant assistance.  Prior to 3 weeks ago patient has never had any memory problems, alert and oriented x4, ambulating with a walker, however daughter reports an episode of sundowning with her prior hospital stay.  She has been complaining of intermittent headaches but that is not unusual for her.  Daughter denies any prior history of seizures.  She is not drinking EtOH, she is not a smoker.  In the ED she was found to have acute kidney injury     Today, pt still remains intermittently confused, noted to be visually hallucinating, seeing a bug on the wall while I was in her room. Keeps talking off tanget. Daughter at bedside states mother is not at baseline. On restraints    Assessment & Plan:  Acute metabolic encephalopathy ??Concern for seizure Vs UTI CT head showed no acute or reversible finding MRI brain showed no acute intracranial abnormality EEG pending UDS pending Neurology consulted, await further recs Continue seizure precautions, restraints   AKI on chronic kidney disease stage IIIa Baseline creatinine 0.9-1.3, elevated at 2.7 on admission Continue gentle  hydration Daily BMP  UTI UA positive for infection BC X 2, UC pending  Hypertension BP stable Continue Norvasc, hold benazepril due to AKI  Diabetes mellitus type 2, uncontrolled A1c 9.5, uncontrolled SSI, 70/30, accuchecks, hypoglycemic protocol  Microcytic anemia Hemoglobin at baseline Anemia panel folate 10.8, Vit B12 458 Daily CBC  Hyperlipidemia Continue statin  Morbid obesity Lifestyle modification advised     Scheduled Meds: . amLODipine  5 mg Oral Daily  . aspirin EC  81 mg Oral Daily  . atorvastatin  40 mg Oral Daily  . heparin  5,000 Units Subcutaneous Q8H  . insulin aspart  0-9 Units Subcutaneous TID WC  . insulin aspart protamine- aspart  20 Units Subcutaneous BID WC  . pantoprazole  40 mg Oral Daily   Continuous Infusions: . cefTRIAXone (ROCEPHIN)  IV 1 g (05/28/20 1600)   PRN Meds:.  Diet Orders (From admission, onward)    Start     Ordered   05/27/20 1926  Diet heart healthy/carb modified Room service appropriate? Yes; Fluid consistency: Thin  Diet effective now       Question Answer Comment  Diet-HS Snack? Nothing   Room service appropriate? Yes   Fluid consistency: Thin      05/27/20 1925          DVT prophylaxis: heparin injection 5,000 Units Start: 05/27/20 2200 SCDs Start: 05/27/20 1926     Code Status: Full Code  Family Communication: Discussed with daughter at bedside on 05/29/20  Status is: Inpatient  Remains inpatient appropriate because:Altered mental status  Dispo: The patient is from:  Home              Anticipated d/c is to: Home              Anticipated d/c date is: 2 days              Patient currently is not medically stable to d/c.  Consultants:  Neurology, Dr. Lorrin Goodell  Procedures:  None  Microbiology  Urine cultures - pending  Antimicrobials: Ceftriaxone 9/4 >>    Objective: Vitals:   05/28/20 2153 05/28/20 2338 05/29/20 0404 05/29/20 0832  BP: (!) 171/90 (!) 144/69 (!) 144/60 130/62  Pulse: 94  89 75 67  Resp: 20 19 20 18   Temp: 98.4 F (36.9 C) 99 F (37.2 C) 98 F (36.7 C) 98.1 F (36.7 C)  TempSrc: Oral   Oral  SpO2: 99% 100% 96% 98%  Weight:      Height:       No intake or output data in the 24 hours ending 05/29/20 1052 Filed Weights   05/27/20 1301  Weight: 122.5 kg    Examination:   General: NAD, on restraints, confused, awake/alert   Cardiovascular: S1, S2 present  Respiratory: CTAB  Abdomen: Soft, nontender, nondistended, bowel sounds present  Musculoskeletal: No bilateral pedal edema noted  Skin: Normal  Psychiatry: Confused   Neurology: Moves all extremities spontaneously    Data Reviewed: I have independently reviewed following labs and imaging studies   CBC: Recent Labs  Lab 05/27/20 1357 05/28/20 0419  WBC 6.5 5.1  NEUTROABS 4.4  --   HGB 10.7* 9.9*  HCT 37.3 34.0*  MCV 71.9* 71.1*  PLT 193 161   Basic Metabolic Panel: Recent Labs  Lab 05/27/20 1357 05/28/20 0419  NA 136 140  K 4.8 4.3  CL 97* 104  CO2 27 27  GLUCOSE 264* 107*  BUN 64* 47*  CREATININE 2.71* 1.78*  CALCIUM 9.6 9.1  MG 2.4  --    Liver Function Tests: Recent Labs  Lab 05/27/20 1357 05/28/20 0419  AST 26 24  ALT 25 23  ALKPHOS 95 78  BILITOT 0.4 0.4  PROT 8.1 7.0  ALBUMIN 4.2 3.6   Coagulation Profile: No results for input(s): INR, PROTIME in the last 168 hours. HbA1C: Recent Labs    05/27/20 1828  HGBA1C 9.5*   CBG: Recent Labs  Lab 05/28/20 0818 05/28/20 1226 05/28/20 1808 05/29/20 0150 05/29/20 0622  GLUCAP 113* 187* 194* 235* 167*    Recent Results (from the past 240 hour(s))  SARS Coronavirus 2 by RT PCR (hospital order, performed in [facility] hospital lab) Nasopharyngeal Nasopharyngeal Swab     Status: None   Collection Time: 05/27/20  7:31 PM   Specimen: Nasopharyngeal Swab  Result Value Ref Range Status   SARS Coronavirus 2 NEGATIVE NEGATIVE Final    Comment: (NOTE) SARS-CoV-2 target nucleic acids are NOT  DETECTED.  The SARS-CoV-2 RNA is generally detectable in upper and lower respiratory specimens during the acute phase of infection. The lowest concentration of SARS-CoV-2 viral copies this assay can detect is 250 copies / mL. A negative result does not preclude SARS-CoV-2 infection and should not be used as the sole basis for treatment or other patient management decisions.  A negative result may occur with improper specimen collection / handling, submission of specimen other than nasopharyngeal swab, presence of viral mutation(s) within the areas targeted by this assay, and inadequate number of viral copies (<250 copies / mL). A negative result must be  combined with clinical observations, patient history, and epidemiological information.  Fact Sheet for Patients:   StrictlyIdeas.no  Fact Sheet for Healthcare Providers: BankingDealers.co.za  This test is not yet approved or  cleared by the [place] FDA and has been authorized for detection and/or diagnosis of SARS-CoV-2 by FDA under an Emergency Use Authorization (EUA).  This EUA will remain in effect (meaning this test can be used) for the duration of the COVID-19 declaration under Section 564(b)(1) of the Act, 21 U.S.C. section 360bbb-3(b)(1), unless the authorization is terminated or revoked sooner.  Performed at [facility], [address], [place], [place] [address]      Radiology Studies: ECHOCARDIOGRAM COMPLETE  Result Date: 05/28/2020    ECHOCARDIOGRAM REPORT   Patient Name:   AUBRIELLE STROUD Mcshea Date of Exam: 05/28/2020 Medical Rec #:  [id number]       Height:       68.0 in Accession #:    [id number]      Weight:       270.0 lb Date of Birth:  1941-09-01       BSA:          2.322 m Patient Age:    66 years        BP:           118/66 mmHg Patient Gender: F               HR:           97 bpm. Exam Location:  [facility] Procedure: 2D Echo, Cardiac Doppler and Color Doppler  Indications:    Syncope 780.2 / R55  History:        Patient has prior history of Echocardiogram examinations, most                 recent 02/17/2013. Risk Factors:Hypertension, Diabetes and                 Dyslipidemia. OSA (obstructive sleep apnea), Obesity, GERD.  Sonographer:    Alvino Chapel RCS Referring Phys: [place]  1. Left ventricular ejection fraction, by estimation, is 60 to 65%. The left ventricle has normal function. The left ventricle has no regional wall motion abnormalities. There is mild concentric left ventricular hypertrophy. Left ventricular diastolic parameters are indeterminate.  2. Right ventricular systolic function is normal. The right ventricular size is normal. There is normal pulmonary artery systolic pressure.  3. Left atrial size was mildly dilated.  4. Right atrial size was mildly dilated.  5. The mitral valve is normal in structure. Trivial mitral valve regurgitation. No evidence of mitral stenosis.  6. The aortic valve is tricuspid. Aortic valve regurgitation is not visualized. No aortic stenosis is present.  7. The inferior vena cava is normal in size with greater than 50% respiratory variability, suggesting right atrial pressure of 3 mmHg. Comparison(s): No significant change from prior study. Conclusion(s)/Recommendation(s): Otherwise normal echocardiogram, with minor abnormalities described in the report. FINDINGS  Left Ventricle: Left ventricular ejection fraction, by estimation, is 60 to 65%. The left ventricle has normal function. The left ventricle has no regional wall motion abnormalities. The left ventricular internal cavity size was normal in size. There is  mild concentric left ventricular hypertrophy. Left ventricular diastolic parameters are indeterminate. Right Ventricle: The right ventricular size is normal. No increase in right ventricular wall thickness. Right ventricular systolic function is normal. There is normal pulmonary artery  systolic pressure. The tricuspid regurgitant velocity is 2.53 m/s,  and  with an assumed right atrial pressure of 3 mmHg, the estimated right ventricular systolic pressure is 67.2 mmHg. Left Atrium: Left atrial size was mildly dilated. Right Atrium: Right atrial size was mildly dilated. Pericardium: The pericardium was not assessed. Mitral Valve: Moderate to severe focal mitral annular calcification posterolateral annulus. The mitral valve is normal in structure. Moderate to severe mitral annular calcification. Trivial mitral valve regurgitation. No evidence of mitral valve stenosis. Tricuspid Valve: The tricuspid valve is normal in structure. Tricuspid valve regurgitation is mild . No evidence of tricuspid stenosis. Aortic Valve: The aortic valve is tricuspid. Aortic valve regurgitation is not visualized. No aortic stenosis is present. Pulmonic Valve: The pulmonic valve was grossly normal. Pulmonic valve regurgitation is trivial. No evidence of pulmonic stenosis. Aorta: The aortic root, ascending aorta and aortic arch are all structurally normal, with no evidence of dilitation or obstruction. Venous: The inferior vena cava is normal in size with greater than 50% respiratory variability, suggesting right atrial pressure of 3 mmHg. IAS/Shunts: No atrial level shunt detected by color flow Doppler.  LEFT VENTRICLE PLAX 2D LVIDd:         4.02 cm  Diastology LVIDs:         2.59 cm  LV e' lateral:   9.57 cm/s LV PW:         1.16 cm  LV E/e' lateral: 10.1 LV IVS:        1.20 cm  LV e' medial:    6.85 cm/s LVOT diam:     1.90 cm  LV E/e' medial:  14.1 LV SV:         73 LV SV Index:   31 LVOT Area:     2.84 cm  RIGHT VENTRICLE RV S prime:     11.70 cm/s TAPSE (M-mode): 2.3 cm LEFT ATRIUM             Index       RIGHT ATRIUM           Index LA diam:        3.20 cm 1.38 cm/m  RA Area:     17.70 cm LA Vol (A2C):   74.8 ml 32.22 ml/m RA Volume:   50.30 ml  21.66 ml/m LA Vol (A4C):   53.8 ml 23.17 ml/m LA Biplane Vol: 66.0  ml 28.43 ml/m  AORTIC VALVE LVOT Vmax:   109.00 cm/s LVOT Vmean:  77.400 cm/s LVOT VTI:    0.257 m  AORTA Ao Root diam: 3.40 cm MITRAL VALVE                TRICUSPID VALVE MV Area (PHT): 2.91 cm     TR Peak grad:   25.6 mmHg MV Decel Time: 261 msec     TR Vmax:        253.00 cm/s MV E velocity: 96.80 cm/s MV A velocity: 124.00 cm/s  SHUNTS MV E/A ratio:  0.78         Systemic VTI:  0.26 m                             Systemic Diam: 1.90 cm Buford Dresser MD Electronically signed by Buford Dresser MD Signature Date/Time: 05/28/2020/1:16:57 PM    Final     Alma Friendly, MD [facility]

## 2020-05-29 NOTE — Consult Note (Addendum)
NEURO HOSPITALIST CONSULT NOTE   Requesting physician: Dr. Horris Latino  Reason for Consult: Concern for seizure  History obtained from:  Daughter, chart  HPI:                                                                                                                                          Laura Atkins is an 79 y.o. female with a past medical history significant for diabetes mellitus type II, hypertension, alpha thalassemia, recurrent urinary tract infections and xanax withdrawal seizures (remote - 2013) who presented to the emergency department following a seizure witnessed by her daughter. Daughter reports that Laura Atkins has had a 5-week history of increasing weakness and altered mental status. According to Laura Atkins' daughter, she is now provided around-the-clock supervision because of her altered mental status. Prior to this time she was in control of her ADLs. The waning mental status and physical weakness is occurring alongside poor blood glucose control whereby her blood sugars would sit in the 200-300+ range and drop to 30-50 following insulin treatment as prescribed. Laura Atkins' baseline mental status worsens with hypoglycemia.   Previous seizure history included a two-year treatment with Keppra and follow up with [facility].   In mid-August, Laura Atkins was brought to [facility] and subsequently taken to the ED for a hypoglycemic episode. At this time she was already experiencing the weakness and altered mental status as noted earlier but the visit was focused on correcting her blood glucose level. Upon release her physician stressed that she needed to be returned to the ED for further workup but "She is concerned about covid everywhere and stated that the ambulance driver told her this ed was full".  On my visit, Laura Atkins was in wrist restraints due to previous episodes of pulling out her catheter and IV.  Pertinent Labs Urine: Cloudy, small  Hgb, large leuks, >50 WBC, few bacteria,   Pertinent Medications Rocephin 1g qd Haldol PRN Insulin SS  Pertinent Imaging/Diagnostics EEG-pending  Past Medical History:  Diagnosis Date  . Alpha thalassemia trait 01/26/2010   02/2012: Nl CBC ex H&H-10.7/34.8, MCV-69   . Anemia   . Anxiety   . Anxiety and depression   . Cellulitis 05/09/2017  . Depression   . Diabetes mellitus   . Foot pain, right 04/30/2013  . GERD (gastroesophageal reflux disease)   . Headache(784.0)   . Hyperlipidemia   . Hypertension   . Iron deficiency 01/21/2017  . Microcytic anemia 01/26/2010   02/2012: Nl CBC ex H&H-10.7/34.8, MCV-69   . NECK PAIN, CHRONIC 10/21/2008   +chronic back pain    . Obesity   . Obstructive sleep apnea   . Osteoarthritis    Left knee; right shoulder; chronic neck and back pain  .  Pruritus   . PVD (peripheral vascular disease) ([place]) 01/28/2014  . Seizures ([place])   . Shoulder pain, right 04/14/2015  . Tremor    This started months ago after her seizure progressing to very poor hand writing  . Urinary incontinence   . UTI (urinary tract infection) 01/18/2013    Past Surgical History:  Procedure Laterality Date  . ABDOMINAL HYSTERECTOMY    . BREAST EXCISIONAL BIOPSY     Left; cyst  . CATARACT EXTRACTION Right    12/2017  . CATARACT EXTRACTION W/ INTRAOCULAR LENS IMPLANT Left 09/07/2013  . CHOLECYSTECTOMY    . COLONOSCOPY    . COLONOSCOPY N/A 07/20/2015   Procedure: COLONOSCOPY;  Surgeon: Rogene Houston, MD;  Location: [facility];  Service: Endoscopy;  Laterality: N/A;  930  . EYE SURGERY Left 09/07/2013   cataract    Family History  Problem Relation Age of Onset  . Lung cancer Mother 77  . Kidney disease Father   . Diabetes Sister   . Keloids Brother   . ADD / ADHD Grandchild   . Bipolar disorder Grandchild   . Bipolar disorder Daughter   . Seizures Daughter   . Heart disease Daughter   . Kidney disease Son   . Neuropathy Son   . Kidney disease Son   . Edema  Daughter   . Breast cancer Daughter 30  . Allergies Daughter   . Alcohol abuse Neg Hx   . Drug abuse Neg Hx     Social History:  reports that she has never smoked. She has never used smokeless tobacco. She reports that she does not drink alcohol and does not use drugs.  Allergies  Allergen Reactions  . Penicillins Shortness Of Breath, Itching and Rash  . Prednisone Shortness Of Breath, Itching and Rash  . Propoxyphene N-Acetaminophen Itching and Nausea And Vomiting  . Sulfa Antibiotics Itching and Nausea And Vomiting    MEDICATIONS:                                                                                                                   Current Meds  Medication Sig  . Accu-Chek FastClix Lancets MISC 1 each by Does not apply route 4 (four) times daily. Use to monitor glucose levels 4 times daily; E11.65  . acetaminophen (EXTRA STRENGTH PAIN RELIEF) 500 MG tablet Take 500 mg by mouth every 6 (six) hours as needed for pain.  . Alcohol Swabs (B-D SINGLE USE SWABS REGULAR) PADS 1 each by Does not apply route 4 (four) times daily. Use to prep site for glucose monitoring 4 times daily; E11.65  . amLODipine (NORVASC) 5 MG tablet Take 1 tablet (5 mg total) by mouth daily.  Marland Kitchen aspirin 81 MG tablet Take 81 mg by mouth daily.    Marland Kitchen atorvastatin (LIPITOR) 40 MG tablet TAKE 1 TABLET BY MOUTH EVERY DAY  . benazepril (LOTENSIN) 20 MG tablet TAKE 1 TABLET BY MOUTH EVERY DAY  . Blood Glucose Monitoring Suppl (ACCU-CHEK GUIDE ME) w/Device  KIT 1 each by Does not apply route 4 (four) times daily. Use to monitor glucose levels 4 times daily; E11.65  . buPROPion (WELLBUTRIN XL) 150 MG 24 hr tablet Take 1 tablet (150 mg total) by mouth every morning.  . Cholecalciferol 125 MCG (5000 UT) TABS Take 1 tablet by mouth daily.  . Continuous Blood Gluc Sensor (FREESTYLE LIBRE 14 DAY SENSOR) MISC 1 Device by Does not apply route every 14 (fourteen) days. E11.9; MUST KEEP APPT IN ORDER TO PROCESS FUTURE REFILL  REQUESTS  . cyclobenzaprine (FLEXERIL) 5 MG tablet Take one  Tablet by mouth at bedtime for 1 week, then as needed  . diphenhydrAMINE (BENADRYL) 25 MG tablet Take 25 mg by mouth at bedtime.  . ferrous sulfate 325 (65 FE) MG tablet Take 1 tablet (325 mg total) by mouth daily with breakfast.  . gabapentin (NEURONTIN) 400 MG capsule TAKE ONE CAPSULE BY MOUTH EVERY MORNING and TAKE ONE CAPSULE AT NOON and TAKE TWO CAPSULES AT BEDTIME  . glucose blood (ACCU-CHEK GUIDE) test strip 1 each by Other route 4 (four) times daily.  . insulin NPH-regular Human (NOVOLIN 70/30 RELION) (70-30) 100 UNIT/ML injection Inject 60 Units into the skin daily with breakfast. (Patient taking differently: Inject 60 Units into the skin daily with breakfast. 30 units in the morning and 30 units in the evening)  . Insulin Syringe-Needle U-100 (INSULIN SYRINGE 1CC/31GX5/16") 31G X 5/16" 1 ML MISC 1 each by Does not apply route daily. Use to inject insulin daily; E11.65  . Multiple Vitamin (MULTIVITAMIN) capsule Take 1 capsule by mouth daily.  Marland Kitchen omeprazole (PRILOSEC) 20 MG capsule TAKE ONE CAPSULE BY MOUTH EVERY DAY  . potassium chloride SA (KLOR-CON) 20 MEQ tablet TAKE 1 TABLET BY MOUTH TWICE DAILY  . sertraline (ZOLOFT) 100 MG tablet Take 1 tablet (100 mg total) by mouth daily.  Marland Kitchen torsemide (DEMADEX) 20 MG tablet TAKE TWO TABLETS BY MOUTH EVERY DAY (STOP lasix) - NEEDS APPOINTMENT FOR MORE REFILLS - LAST SEEN 2018  . UNABLE TO FIND Walker x 1  DX unsteady gait, osteoarthritis of left knee  . UNABLE TO FIND Elevated [place] Seat x 1 DX: unsteady gait, back pain, osteoarthritis of left knee  . UNABLE TO FIND Standing upright walker x 1  DX M54.40, M19.90     Review Of Systems:                                                                                                           History obtained from daughter  General: Negative for fever, weight gain or weight loss Psychological: History of anxiety,  depression Ophthalmic: Negative for blurry or double vision ENT: Negative for abrupt loss of hearing or vertigo Respiratory: Negative for cough, shortness of breath  Cardiovascular: Negative for chest pain Gastrointestinal: Negative for abdominal pain, diarrhea, nausea/vomiting  Musculoskeletal: Worsening muscular weakness as noted in HPI Neurological: As noted in HPI Dermatological: Burning in feet that worsens with touch due to diabetic neuropathy  Blood pressure 130/62, pulse 67, temperature 98.1 F (36.7 C), temperature  source Oral, resp. rate 18, height 5' 8" (1.727 m), weight 122.5 kg, SpO2 98 %.   Physical Examination:                                                                                                      General: WDWN female. Appears calm and comfortable in bed. HEENT:  Normocephalic, no lesions, without obvious abnormality.  Normal external eyes, external ears and external nose. Normal pharynx. Cardiovascular: RRR, pulses palpable throughout   Pulmonary: Breathing comfortably on room air Abdomen: Soft, non-tender Extremities: no edema Musculoskeletal: Tone and bulk normal tone throughout; no atrophy noted Skin: warm and dry, no hyperpigmentation, vitiligo, or suspicious lesions  Neurological Examination:                                                                                               Mental Status: TABBY BEASTON is alert, oriented x to self, but not to location or time. Thought content occasionally appropriate. Speech fluent without evidence of aphasia. Able to follow 2-step commands without difficulty. Cranial Nerves: II: Visual fields grossly normal, pupils are equal, round, reactive to light. III,IV, VI: Ptosis not present, extra-ocular muscle movements intact bilaterally V,VII: Smile and eyebrow raise is symmetric. Facial light touch sensation intact bilaterally VIII: Hearing grossly intact IX,X: Uvula and palate rise symmetrically XI: SCM and  bilateral shoulder shrug strength 4/5 XII: Midline tongue extension with slight tremor Motor: Right :     Upper extremity   4/5   Left:     Upper extremity   4/5          Lower extremity   4/5     Lower extremity  3/5 Pronator drift not tested due to restraints. Sensory: Light touch intact throughout, bilaterally, cold sensation not noted until the knee bilaterally Deep Tendon Reflexes: 1+ and symmetric throughout upper limbs, dropped lower limbs Plantars: Right: downgoing   Left: downgoing Cerebellar: Unable to complete finger-to-nose test due to hand restraints. Heel-to-shin test limited due to ROM Gait: Not tested   Lab Results: Basic Metabolic Panel: Recent Labs  Lab 05/27/20 1357 05/28/20 0419  NA 136 140  K 4.8 4.3  CL 97* 104  CO2 27 27  GLUCOSE 264* 107*  BUN 64* 47*  CREATININE 2.71* 1.78*  CALCIUM 9.6 9.1  MG 2.4  --     Liver Function Tests: Recent Labs  Lab 05/27/20 1357 05/28/20 0419  AST 26 24  ALT 25 23  ALKPHOS 95 78  BILITOT 0.4 0.4  PROT 8.1 7.0  ALBUMIN 4.2 3.6   No results for input(s): LIPASE, AMYLASE in the last 168 hours. Recent Labs  Lab 05/27/20 1828  AMMONIA 30  CBC: Recent Labs  Lab 05/27/20 1357 05/28/20 0419  WBC 6.5 5.1  NEUTROABS 4.4  --   HGB 10.7* 9.9*  HCT 37.3 34.0*  MCV 71.9* 71.1*  PLT 193 184    Cardiac Enzymes: No results for input(s): CKTOTAL, CKMB, CKMBINDEX, TROPONINI in the last 168 hours.  Lipid Panel: No results for input(s): CHOL, TRIG, HDL, CHOLHDL, VLDL, LDLCALC in the last 168 hours.  CBG: Recent Labs  Lab 05/28/20 0818 05/28/20 1226 05/28/20 1808 05/29/20 0150 05/29/20 0622  GLUCAP 113* 187* 194* 235* 167*    Microbiology: Results for orders placed or performed during the hospital encounter of 05/27/20  SARS Coronavirus 2 by RT PCR (hospital order, performed in [facility] hospital lab) Nasopharyngeal Nasopharyngeal Swab     Status: None   Collection Time: 05/27/20  7:31 PM    Specimen: Nasopharyngeal Swab  Result Value Ref Range Status   SARS Coronavirus 2 NEGATIVE NEGATIVE Final    Comment: (NOTE) SARS-CoV-2 target nucleic acids are NOT DETECTED.  The SARS-CoV-2 RNA is generally detectable in upper and lower respiratory specimens during the acute phase of infection. The lowest concentration of SARS-CoV-2 viral copies this assay can detect is 250 copies / mL. A negative result does not preclude SARS-CoV-2 infection and should not be used as the sole basis for treatment or other patient management decisions.  A negative result may occur with improper specimen collection / handling, submission of specimen other than nasopharyngeal swab, presence of viral mutation(s) within the areas targeted by this assay, and inadequate number of viral copies (<250 copies / mL). A negative result must be combined with clinical observations, patient history, and epidemiological information.  Fact Sheet for Patients:   StrictlyIdeas.no  Fact Sheet for Healthcare Providers: BankingDealers.co.za  This test is not yet approved or  cleared by the [place] FDA and has been authorized for detection and/or diagnosis of SARS-CoV-2 by FDA under an Emergency Use Authorization (EUA).  This EUA will remain in effect (meaning this test can be used) for the duration of the COVID-19 declaration under Section 564(b)(1) of the Act, 21 U.S.C. section 360bbb-3(b)(1), unless the authorization is terminated or revoked sooner.  Performed at [facility], [address], [place], [place] [address]    *Note: Due to a large number of results and/or encounters for the requested time period, some results have not been displayed. A complete set of results can be found in Results Review.    Coagulation Studies: No results for input(s): LABPROT, INR in the last 72 hours.  Imaging: DG Chest 1 View  Result Date: 05/27/2020 CLINICAL DATA:  Seizure  EXAM: CHEST  1 VIEW COMPARISON:  05/10/2020 FINDINGS: Mild cardiomegaly, stable. Atherosclerotic calcification of the aortic knob. No focal airspace consolidation, pleural effusion, or pneumothorax. Degenerative changes of the shoulders, right worse than left. IMPRESSION: No active disease. Electronically Signed   By: Davina Poke D.O.   On: 05/27/2020 14:02   CT Head Wo Contrast  Result Date: 05/27/2020 CLINICAL DATA:  Seizure like activity.  Hyperglycemia. EXAM: CT HEAD WITHOUT CONTRAST TECHNIQUE: Contiguous axial images were obtained from the base of the skull through the vertex without intravenous contrast. COMPARISON:  05/10/2020 FINDINGS: Brain: Age related atrophy. Chronic small-vessel ischemic changes of the hemispheric white matter. No sign of acute infarction, mass lesion, hemorrhage, hydrocephalus or extra-axial collection. Vascular: There is atherosclerotic calcification of the major vessels at the base of the brain. Skull: Negative Sinuses/Orbits: Clear/normal Other: None IMPRESSION: No acute or reversible  finding. Age related atrophy. Chronic small-vessel ischemic changes of the hemispheric white matter. Electronically Signed   By: Nelson Chimes M.D.   On: 05/27/2020 13:58   MR BRAIN WO CONTRAST  Result Date: 05/27/2020 CLINICAL DATA:  Mental status change EXAM: MRI HEAD WITHOUT CONTRAST TECHNIQUE: Multiplanar, multiecho pulse sequences of the brain and surrounding structures were obtained without intravenous contrast. COMPARISON:  2013 FINDINGS: Motion artifact is present. Brain: There is no acute infarction or intracranial hemorrhage. There is no intracranial mass, mass effect, or edema. There is no hydrocephalus or extra-axial fluid collection. Prominence of the ventricles and sulci reflects generalized parenchymal volume loss similar to the prior study. Patchy and confluent areas of T2 hyperintensity in the supratentorial white matter are nonspecific but probably reflect similar moderate  chronic microvascular ischemic changes. Vascular: Major vessel flow voids at the skull base are preserved. Skull and upper cervical spine: Normal marrow signal is preserved. Sinuses/Orbits: Paranasal sinuses are aerated. Bilateral lens replacements. Other: Sella is unremarkable.  Mastoid air cells are clear. IMPRESSION: No acute infarction, hemorrhage, or mass. Moderate chronic microvascular ischemic changes. Electronically Signed   By: Macy Mis M.D.   On: 05/27/2020 15:47   ECHOCARDIOGRAM COMPLETE  Result Date: 05/28/2020    ECHOCARDIOGRAM REPORT   Patient Name:   LAURALYN SHADOWENS [place] Date of Exam: 05/28/2020 Medical Rec #:  [id number]       Height:       68.0 in Accession #:    [id number]      Weight:       270.0 lb Date of Birth:  1941/04/09       BSA:          2.322 m Patient Age:    32 years        BP:           118/66 mmHg Patient Gender: F               HR:           97 bpm. Exam Location:  [facility] Procedure: 2D Echo, Cardiac Doppler and Color Doppler Indications:    Syncope 780.2 / R55  History:        Patient has prior history of Echocardiogram examinations, most                 recent 02/17/2013. Risk Factors:Hypertension, Diabetes and                 Dyslipidemia. OSA (obstructive sleep apnea), Obesity, GERD.  Sonographer:    Alvino Chapel RCS Referring Phys: [place]  1. Left ventricular ejection fraction, by estimation, is 60 to 65%. The left ventricle has normal function. The left ventricle has no regional wall motion abnormalities. There is mild concentric left ventricular hypertrophy. Left ventricular diastolic parameters are indeterminate.  2. Right ventricular systolic function is normal. The right ventricular size is normal. There is normal pulmonary artery systolic pressure.  3. Left atrial size was mildly dilated.  4. Right atrial size was mildly dilated.  5. The mitral valve is normal in structure. Trivial mitral valve regurgitation. No evidence of mitral stenosis.   6. The aortic valve is tricuspid. Aortic valve regurgitation is not visualized. No aortic stenosis is present.  7. The inferior vena cava is normal in size with greater than 50% respiratory variability, suggesting right atrial pressure of 3 mmHg. Comparison(s): No significant change from prior study. Conclusion(s)/Recommendation(s): Otherwise normal echocardiogram, with minor abnormalities described in  the report. FINDINGS  Left Ventricle: Left ventricular ejection fraction, by estimation, is 60 to 65%. The left ventricle has normal function. The left ventricle has no regional wall motion abnormalities. The left ventricular internal cavity size was normal in size. There is  mild concentric left ventricular hypertrophy. Left ventricular diastolic parameters are indeterminate. Right Ventricle: The right ventricular size is normal. No increase in right ventricular wall thickness. Right ventricular systolic function is normal. There is normal pulmonary artery systolic pressure. The tricuspid regurgitant velocity is 2.53 m/s, and  with an assumed right atrial pressure of 3 mmHg, the estimated right ventricular systolic pressure is 25.3 mmHg. Left Atrium: Left atrial size was mildly dilated. Right Atrium: Right atrial size was mildly dilated. Pericardium: The pericardium was not assessed. Mitral Valve: Moderate to severe focal mitral annular calcification posterolateral annulus. The mitral valve is normal in structure. Moderate to severe mitral annular calcification. Trivial mitral valve regurgitation. No evidence of mitral valve stenosis. Tricuspid Valve: The tricuspid valve is normal in structure. Tricuspid valve regurgitation is mild . No evidence of tricuspid stenosis. Aortic Valve: The aortic valve is tricuspid. Aortic valve regurgitation is not visualized. No aortic stenosis is present. Pulmonic Valve: The pulmonic valve was grossly normal. Pulmonic valve regurgitation is trivial. No evidence of pulmonic stenosis.  Aorta: The aortic root, ascending aorta and aortic arch are all structurally normal, with no evidence of dilitation or obstruction. Venous: The inferior vena cava is normal in size with greater than 50% respiratory variability, suggesting right atrial pressure of 3 mmHg. IAS/Shunts: No atrial level shunt detected by color flow Doppler.  LEFT VENTRICLE PLAX 2D LVIDd:         4.02 cm  Diastology LVIDs:         2.59 cm  LV e' lateral:   9.57 cm/s LV PW:         1.16 cm  LV E/e' lateral: 10.1 LV IVS:        1.20 cm  LV e' medial:    6.85 cm/s LVOT diam:     1.90 cm  LV E/e' medial:  14.1 LV SV:         73 LV SV Index:   31 LVOT Area:     2.84 cm  RIGHT VENTRICLE RV S prime:     11.70 cm/s TAPSE (M-mode): 2.3 cm LEFT ATRIUM             Index       RIGHT ATRIUM           Index LA diam:        3.20 cm 1.38 cm/m  RA Area:     17.70 cm LA Vol (A2C):   74.8 ml 32.22 ml/m RA Volume:   50.30 ml  21.66 ml/m LA Vol (A4C):   53.8 ml 23.17 ml/m LA Biplane Vol: 66.0 ml 28.43 ml/m  AORTIC VALVE LVOT Vmax:   109.00 cm/s LVOT Vmean:  77.400 cm/s LVOT VTI:    0.257 m  AORTA Ao Root diam: 3.40 cm MITRAL VALVE                TRICUSPID VALVE MV Area (PHT): 2.91 cm     TR Peak grad:   25.6 mmHg MV Decel Time: 261 msec     TR Vmax:        253.00 cm/s MV E velocity: 96.80 cm/s MV A velocity: 124.00 cm/s  SHUNTS MV E/A ratio:  0.78  Systemic VTI:  0.26 m                             Systemic Diam: 1.90 cm Buford Dresser MD Electronically signed by Buford Dresser MD Signature Date/Time: 05/28/2020/1:16:57 PM    Final      Thank you for consulting the [facility] team. Assessment and plan per attending neurologist.   Solon Augusta PA-C [facility]  05/29/2020, 11:18 AM  Assessment and Plan: [name] is a 79 y.o. female with hx of diabetes mellitus type II, hypertension, alpha thalassemia, recurrent urinary tract infections and xanax withdrawal seizures (remote - 2013) who  presented to the emergency department following a seizure witnessed by her daughter.  Her seizure appears to be a provoked episode in the setting of a UTI with AKI. Workup with an MRI Brain without contrast with no significant abnormality. She did have a prior seizure back in 2013, and reading through the documentation suggests that this too was a provoked event 2/2 Xanax withdrawal. rEEG at that time with R hemispheric slowing. She was placed on Kepra back in 2013 and was on it for 2 years between 2013 and 2015.   She has been off Keppra for the last 6 years and has not had a single unprovoked seizure. I am not going to start her on an maintenance AED at this time.  Recs: - No maintenance AED unless she has unprovoked seizures. - Neurology inpatient team will signoff. Please feel free to contact us with any questions or concerns. - Patient may benefit from outpatient neurology follow up for assessment of potential underlying dementia in 2-3 months. Sufficient time should pass between current hospitalization and outpatient follow up to allow for her mental status to return to baseline.   [facility] Pager Number [phone]

## 2020-05-30 LAB — CBC WITH DIFFERENTIAL/PLATELET
Abs Immature Granulocytes: 0.01 10*3/uL (ref 0.00–0.07)
Basophils Absolute: 0 10*3/uL (ref 0.0–0.1)
Basophils Relative: 0 %
Eosinophils Absolute: 0.2 10*3/uL (ref 0.0–0.5)
Eosinophils Relative: 4 %
HCT: 34.2 % — ABNORMAL LOW (ref 36.0–46.0)
Hemoglobin: 10 g/dL — ABNORMAL LOW (ref 12.0–15.0)
Immature Granulocytes: 0 %
Lymphocytes Relative: 42 %
Lymphs Abs: 2 10*3/uL (ref 0.7–4.0)
MCH: 20.5 pg — ABNORMAL LOW (ref 26.0–34.0)
MCHC: 29.2 g/dL — ABNORMAL LOW (ref 30.0–36.0)
MCV: 70.2 fL — ABNORMAL LOW (ref 80.0–100.0)
Monocytes Absolute: 0.4 10*3/uL (ref 0.1–1.0)
Monocytes Relative: 9 %
Neutro Abs: 2.1 10*3/uL (ref 1.7–7.7)
Neutrophils Relative %: 45 %
Platelets: 179 10*3/uL (ref 150–400)
RBC: 4.87 MIL/uL (ref 3.87–5.11)
RDW: 15 % (ref 11.5–15.5)
WBC: 4.7 10*3/uL (ref 4.0–10.5)
nRBC: 0 % (ref 0.0–0.2)

## 2020-05-30 LAB — GLUCOSE, CAPILLARY
Glucose-Capillary: 136 mg/dL — ABNORMAL HIGH (ref 70–99)
Glucose-Capillary: 127 mg/dL — ABNORMAL HIGH (ref 70–99)
Glucose-Capillary: 105 mg/dL — ABNORMAL HIGH (ref 70–99)
Glucose-Capillary: 103 mg/dL — ABNORMAL HIGH (ref 70–99)
Glucose-Capillary: 53 mg/dL — ABNORMAL LOW (ref 70–99)
Glucose-Capillary: 58 mg/dL — ABNORMAL LOW (ref 70–99)
Glucose-Capillary: 80 mg/dL (ref 70–99)

## 2020-05-30 LAB — BASIC METABOLIC PANEL
Anion gap: 8 (ref 5–15)
BUN: 17 mg/dL (ref 8–23)
CO2: 27 mmol/L (ref 22–32)
Calcium: 9.6 mg/dL (ref 8.9–10.3)
Chloride: 108 mmol/L (ref 98–111)
Creatinine, Ser: 1.19 mg/dL — ABNORMAL HIGH (ref 0.44–1.00)
GFR calc Af Amer: 51 mL/min — ABNORMAL LOW (ref >60)
GFR calc non Af Amer: 44 mL/min — ABNORMAL LOW (ref >60)
Glucose, Bld: 116 mg/dL — ABNORMAL HIGH (ref 70–99)
Potassium: 4 mmol/L (ref 3.5–5.1)
Sodium: 143 mmol/L (ref 135–145)

## 2020-05-30 MED ORDER — ONDANSETRON HCL 4 MG/2ML IJ SOLN
4.0000 mg | Freq: Three times a day (TID) | INTRAMUSCULAR | Status: DC | PRN
  Administered 2020-05-30: 4 mg via INTRAVENOUS
  Filled 2020-05-30: qty 2

## 2020-05-30 NOTE — NC FL2 (Signed)
[place] LEVEL OF CARE SCREENING TOOL     IDENTIFICATION  Patient Name: Laura Atkins Birthdate: 1940/10/02 Sex: female Admission Date (Current Location): 05/27/2020  [facility] and [place] Number:  [employer] and Address:  [facility], [place] [address] [place], [place] [address]      Provider Number: [id number]  Attending Physician Name and Address:  Alma Friendly, MD  Relative Name and Phone Number:       Current Level of Care: Hospital Recommended Level of Care: [place] Prior Approval Number:    Date Approved/Denied:   PASRR Number: [id number]  Discharge Plan: SNF    Current Diagnoses: Patient Active Problem List   Diagnosis Date Noted  . AKI (acute kidney injury) ([place]) 05/27/2020  . Carotid bruit 05/26/2020  . Hyperlipidemia associated with type 2 diabetes mellitus ([place]) 12/08/2019  . Iron deficiency 01/21/2017  . Vitamin D deficiency 10/15/2016  . Urinary incontinence 10/15/2016  . Limited mobility 05/27/2016  . Type 2 diabetes mellitus with hyperglycemia ([place]) 02/04/2016  . Sleep terror disorder 12/14/2015  . Fecal incontinence 12/13/2015  . Back pain of lumbar region with sciatica 04/15/2015  . Spondylosis, cervical, with myelopathy 04/14/2015  . OSA (obstructive sleep apnea) 04/10/2013  . Peripheral neuropathy 03/12/2013  . Hearing loss 11/06/2012  . Altered mental status 01/25/2012  . UNSTEADY GAIT 11/07/2010  . Alpha thalassemia ([place]) 01/26/2010  . Morbid obesity ([place]) 04/21/2008  . Anxiety and depression 04/21/2008  . Essential hypertension 04/21/2008  . GERD 04/21/2008  . Osteoarthritis of left knee 04/21/2008  . URINARY INCONTINENCE 04/21/2008    Orientation RESPIRATION BLADDER Height & Weight     Self  Normal Incontinent Weight: 270 lb (122.5 kg) Height:  5\' 8"  (172.7 cm)  BEHAVIORAL SYMPTOMS/MOOD NEUROLOGICAL BOWEL NUTRITION STATUS    Convulsions/Seizures Continent  Diet (heart healthy/carb modified)  AMBULATORY STATUS COMMUNICATION OF NEEDS Skin   Extensive Assist Verbally Normal                       Personal Care Assistance Level of Assistance  Bathing, Feeding, Dressing Bathing Assistance: Limited assistance Feeding assistance: Independent Dressing Assistance: Limited assistance     Functional Limitations Info             SPECIAL CARE FACTORS FREQUENCY  PT (By licensed PT), OT (By licensed OT)     PT Frequency: 5x/wk OT Frequency: 5x/wk            Contractures Contractures Info: Not present    Additional Factors Info  Code Status, Allergies Code Status Info: full Allergies Info: Penicillins, Prednisone, Propoxyphene N-acetaminophen, Sulfa Antibiotics           Current Medications (05/30/2020):  This is the current hospital active medication list Current Facility-Administered Medications  Medication Dose Route Frequency Provider Last Rate Last Admin  . amLODipine (NORVASC) tablet 5 mg  5 mg Oral Daily Elgergawy, Silver Huguenin, MD   5 mg at 05/30/20 0909  . aspirin EC tablet 81 mg  81 mg Oral Daily Elgergawy, Silver Huguenin, MD   81 mg at 05/30/20 0909  . atorvastatin (LIPITOR) tablet 40 mg  40 mg Oral Daily Elgergawy, Silver Huguenin, MD   40 mg at 05/30/20 0909  . cefTRIAXone (ROCEPHIN) 1 g in sodium chloride 0.9 % 100 mL IVPB  1 g Intravenous Q24H Caren Griffins, MD 200 mL/hr at 05/29/20 1525 1 g at 05/29/20 1525  . haloperidol  lactate (HALDOL) injection 2 mg  2 mg Intravenous Q6H PRN Caren Griffins, MD   2 mg at 05/28/20 1601  . heparin injection 5,000 Units  5,000 Units Subcutaneous Q8H Elgergawy, Silver Huguenin, MD   5,000 Units at 05/30/20 [phone]  . insulin aspart (novoLOG) injection 0-9 Units  0-9 Units Subcutaneous TID WC Elgergawy, Silver Huguenin, MD   1 Units at 05/30/20 0850  . insulin aspart protamine- aspart (NOVOLOG MIX 70/30) injection 20 Units  20 Units Subcutaneous BID WC Elgergawy, Silver Huguenin, MD   20 Units at 05/30/20 0850  .  ondansetron (ZOFRAN) injection 4 mg  4 mg Intravenous Q8H PRN Alma Friendly, MD      . pantoprazole (PROTONIX) EC tablet 40 mg  40 mg Oral Daily Elgergawy, Silver Huguenin, MD   40 mg at 05/30/20 0908     Discharge Medications: Please see discharge summary for a list of discharge medications.  Relevant Imaging Results:  Relevant Lab Results:   Additional Information SS#: [id number]  Geralynn Ochs, LCSW

## 2020-05-30 NOTE — TOC Initial Note (Signed)
Transition of Care [facility]) - Initial/Assessment Note    Patient Details  Name: Laura Atkins MRN: [id number] Date of Birth: 28-Jan-1941  Transition of Care [facility]) CM/SW Contact:    Geralynn Ochs, LCSW Phone Number: 05/30/2020, 1:15 PM  Clinical Narrative:     CSW met with patient and daughter, [name], at bedside to discuss recommendation for SNF. [name] indicated there was no way they were sending their mom to a COVID-infested nursing home, but got her sister on the phone, who the patient lives with. Daughter, [name], on the phone indicated that there was no way that they could bring the patient home, because they cannot provide 24/7 support and cannot care for her until she is able to walk better on her own. [name] asked for SNF, preference for [facility]. After hanging up, [name] indicated that she would keep talking with other family to try to arrange for 24/7 support for the patient to keep her home. CSW to check back in with family this afternoon. CSW sent referral to [facility] and left voicemail for Admissions to review (out of office today for holiday).              Expected Discharge Plan: Skilled Nursing Facility Barriers to Discharge: Ship broker, Continued Medical Work up, Family Issues   Patient Goals and CMS Choice Patient states their goals for this hospitalization and ongoing recovery are:: patient unable to participate in goal setting due to disorientation CMS Medicare.gov Compare Post Acute Care list provided to:: Patient Represenative (must comment) Choice offered to / list presented to : Adult Children  Expected Discharge Plan and Services Expected Discharge Plan: [place] Acute Care Choice: [place] Living arrangements for the past 2 months: Single Family Home                                      Prior Living Arrangements/Services Living arrangements for the past 2 months: Single Family Home Lives  with:: Adult Children, Spouse Patient language and need for interpreter reviewed:: No Do you feel safe going back to the place where you live?: Yes      Need for Family Participation in Patient Care: Yes (Comment) Care giver support system in place?: No (comment) Current home services: DME Criminal Activity/Legal Involvement Pertinent to Current Situation/Hospitalization: No - Comment as needed  Activities of Daily Living Home Assistive Devices/Equipment: Gilford Rile (specify type) ADL Screening (condition at time of admission) Patient's cognitive ability adequate to safely complete daily activities?: Yes Is the patient deaf or have difficulty hearing?: No Does the patient have difficulty seeing, even when wearing glasses/contacts?: No Does the patient have difficulty concentrating, remembering, or making decisions?: No Patient able to express need for assistance with ADLs?: Yes Does the patient have difficulty dressing or bathing?: Yes Independently performs ADLs?: No Communication: Independent Dressing (OT): Needs assistance Is this a change from baseline?: Pre-admission baseline Grooming: Needs assistance Is this a change from baseline?: Pre-admission baseline Feeding: Independent Bathing: Needs assistance Is this a change from baseline?: Pre-admission baseline Toileting: Needs assistance Is this a change from baseline?: Pre-admission baseline In/Out Bed: Independent with device (comment) Walks in Home: Independent with device (comment) Does the patient have difficulty walking or climbing stairs?: Yes Weakness of Legs: Both Weakness of Arms/Hands: None  Permission Sought/Granted Permission sought to share information with : Facility Sport and exercise psychologist, Family Supports Permission  granted to share information with : Yes, Verbal Permission Granted  Share Information with NAME: Daryel November  Permission granted to share info w AGENCY: SNF  Permission granted to share info w  Relationship: Daughters     Emotional Assessment   Attitude/Demeanor/Rapport: Unable to Assess Affect (typically observed): Unable to Assess Orientation: : Oriented to Self Alcohol / Substance Use: Not Applicable Psych Involvement: No (comment)  Admission diagnosis:  Infection [B99.9] Failure to thrive in adult [R62.7] AKI (acute kidney injury) ([place]) [N17.9] Seizure-like activity ([place]) [R56.9] Patient Active Problem List   Diagnosis Date Noted  . AKI (acute kidney injury) ([place]) 05/27/2020  . Carotid bruit 05/26/2020  . Hyperlipidemia associated with type 2 diabetes mellitus ([place]) 12/08/2019  . Iron deficiency 01/21/2017  . Vitamin D deficiency 10/15/2016  . Urinary incontinence 10/15/2016  . Limited mobility 05/27/2016  . Type 2 diabetes mellitus with hyperglycemia ([place]) 02/04/2016  . Sleep terror disorder 12/14/2015  . Fecal incontinence 12/13/2015  . Back pain of lumbar region with sciatica 04/15/2015  . Spondylosis, cervical, with myelopathy 04/14/2015  . OSA (obstructive sleep apnea) 04/10/2013  . Peripheral neuropathy 03/12/2013  . Hearing loss 11/06/2012  . Altered mental status 01/25/2012  . UNSTEADY GAIT 11/07/2010  . Alpha thalassemia ([place]) 01/26/2010  . Morbid obesity ([place]) 04/21/2008  . Anxiety and depression 04/21/2008  . Essential hypertension 04/21/2008  . GERD 04/21/2008  . Osteoarthritis of left knee 04/21/2008  . URINARY INCONTINENCE 04/21/2008   PCP:  Fayrene Helper, MD Pharmacy:   [facility] [place] [address] [place] [address] Phone: [phone] Fax: [phone]  [place], [place] [address] Phone: [phone] Fax: [facility] [place], [place] [address] Phone: [phone] Fax: [phone]     Social Determinants of Health (SDOH) Interventions     Readmission Risk Interventions No flowsheet data found.

## 2020-05-30 NOTE — Progress Notes (Signed)
PROGRESS NOTE  Laura Atkins GYJ:[id number] DOB: 1941-04-30 DOA: 05/27/2020 PCP: Fayrene Helper, MD   LOS: 3 days   Brief Narrative / Interim history: 79 year old female with history of alpha thalassemia, diabetes mellitus, hypertension, hyperlipidemia, was brought to the ED by the daughter due to concerns of patient having a seizure.  Daughter reports an episode of about a minute longer generalized tonic-clonic seizures, eye rolling back and remained confused 5 minutes after that.  Daughter reports that her sugar was around mid 300s around that time, she carries a log with the patient CBGs and her sugars usually are in the mid to upper 200s.  Daughter also mentions that over the last 3 weeks patient has not been herself, she has been confused, had poor appetite and sleeping most of the day, unable to walk without significant assistance.  Prior to 3 weeks ago patient has never had any memory problems, alert and oriented x4, ambulating with a walker, however daughter reports an episode of sundowning with her prior hospital stay.  She has been complaining of intermittent headaches but that is not unusual for her.  Daughter denies any prior history of seizures.  She is not drinking EtOH, she is not a smoker.  In the ED she was found to have acute kidney injury    Today, patient still confused but with slight improvement, denied any other new complaints.  Unable to have a reasonable conversation.  Daughter at bedside, all questions answered.     Assessment & Plan:  Acute metabolic encephalopathy ??Concern for seizure Vs UTI No witnessed seizures since admission History of provoked seizure in 2013 2/2 Xanax withdrawal, was placed on Keppra and has been off for the last 6 years CT head showed no acute or reversible finding MRI brain showed no acute intracranial abnormality EEG showed no seizure or epileptiform discharges UDS negative Neurology consulted, no need for AED unless unprovoked  seizures, outpatient follow-up for possibly underlying dementia in 2 to 3 months Continue seizure precautions  AKI on chronic kidney disease stage IIIa Baseline creatinine 0.9-1.3, elevated at 2.7 on admission S/p gentle hydration Daily BMP  Gram neg rods UTI UA positive for infection UC growing gram-negative rods BC X 2 NGTD Continue IV ceftriaxone  Hypertension BP stable Continue Norvasc, hold benazepril due to AKI  Diabetes mellitus type 2, uncontrolled A1c 9.5, uncontrolled SSI, 70/30, accuchecks, hypoglycemic protocol  Microcytic anemia Hemoglobin at baseline Anemia panel folate 10.8, Vit B12 458 Daily CBC  Hyperlipidemia Continue statin  Morbid obesity Lifestyle modification advised     Scheduled Meds:  amLODipine  5 mg Oral Daily   aspirin EC  81 mg Oral Daily   atorvastatin  40 mg Oral Daily   heparin  5,000 Units Subcutaneous Q8H   insulin aspart  0-9 Units Subcutaneous TID WC   insulin aspart protamine- aspart  20 Units Subcutaneous BID WC   pantoprazole  40 mg Oral Daily   Continuous Infusions:  cefTRIAXone (ROCEPHIN)  IV 1 g (05/29/20 1525)   PRN Meds:.  Diet Orders (From admission, onward)    Start     Ordered   05/27/20 1926  Diet heart healthy/carb modified Room service appropriate? Yes; Fluid consistency: Thin  Diet effective now       Question Answer Comment  Diet-HS Snack? Nothing   Room service appropriate? Yes   Fluid consistency: Thin      05/27/20 1925          DVT prophylaxis: heparin injection 5,000  Units Start: 05/27/20 2200 SCDs Start: 05/27/20 1926     Code Status: Full Code  Family Communication: Discussed with daughter at bedside on 05/30/20  Status is: Inpatient  Remains inpatient appropriate because:Altered mental status  Dispo: The patient is from: Home              Anticipated d/c is to: Home              Anticipated d/c date is: 2 days              Patient currently is not medically stable to  d/c.  Consultants:  Neurology, Dr. Lorrin Goodell  Procedures:  None  Microbiology  Urine cultures - pending  Antimicrobials: Ceftriaxone 9/4 >>    Objective: Vitals:   05/30/20 0351 05/30/20 0823 05/30/20 1145 05/30/20 1543  BP: (!) 136/53 132/72 (!) 145/52 (!) 131/54  Pulse: 69 73 68 69  Resp: 18 18 17 20   Temp: 98.3 F (36.8 C) 98.3 F (36.8 C) 98.6 F (37 C) 98.2 F (36.8 C)  TempSrc: Oral Oral Oral Oral  SpO2: 97% 98% 97% 100%  Weight:      Height:       No intake or output data in the 24 hours ending 05/30/20 1711 Filed Weights   05/27/20 1301  Weight: 122.5 kg    Examination:   General: NAD, confused, awake/alert   Cardiovascular: S1, S2 present  Respiratory: CTAB  Abdomen: Soft, nontender, nondistended, bowel sounds present  Musculoskeletal: No bilateral pedal edema noted  Skin: Normal  Psychiatry: Confused   Neurology: Moves all extremities spontaneously    Data Reviewed: I have independently reviewed following labs and imaging studies   CBC: Recent Labs  Lab 05/27/20 1357 05/28/20 0419 05/29/20 1059 05/30/20 0400  WBC 6.5 5.1 4.9 4.7  NEUTROABS 4.4  --  2.5 2.1  HGB 10.7* 9.9* 10.2* 10.0*  HCT 37.3 34.0* 35.7* 34.2*  MCV 71.9* 71.1* 70.6* 70.2*  PLT 193 184 184 914   Basic Metabolic Panel: Recent Labs  Lab 05/27/20 1357 05/28/20 0419 05/29/20 1059 05/30/20 0400  NA 136 140 142 143  K 4.8 4.3 4.3 4.0  CL 97* 104 104 108  CO2 27 27 27 27   GLUCOSE 264* 107* 223* 116*  BUN 64* 47* 22 17  CREATININE 2.71* 1.78* 1.51* 1.19*  CALCIUM 9.6 9.1 9.6 9.6  MG 2.4  --   --   --    Liver Function Tests: Recent Labs  Lab 05/27/20 1357 05/28/20 0419  AST 26 24  ALT 25 23  ALKPHOS 95 78  BILITOT 0.4 0.4  PROT 8.1 7.0  ALBUMIN 4.2 3.6   Coagulation Profile: No results for input(s): INR, PROTIME in the last 168 hours. HbA1C: Recent Labs    05/27/20 1828  HGBA1C 9.5*   CBG: Recent Labs  Lab 05/29/20 1626 05/30/20 0649  05/30/20 0825 05/30/20 1147 05/30/20 1546  GLUCAP 237* 136* 127* 105* 103*    Recent Results (from the past 240 hour(s))  SARS Coronavirus 2 by RT PCR (hospital order, performed in [facility] hospital lab) Nasopharyngeal Nasopharyngeal Swab     Status: None   Collection Time: 05/27/20  7:31 PM   Specimen: Nasopharyngeal Swab  Result Value Ref Range Status   SARS Coronavirus 2 NEGATIVE NEGATIVE Final    Comment: (NOTE) SARS-CoV-2 target nucleic acids are NOT DETECTED.  The SARS-CoV-2 RNA is generally detectable in upper and lower respiratory specimens during the acute phase of infection. The lowest  concentration of SARS-CoV-2 viral copies this assay can detect is 250 copies / mL. A negative result does not preclude SARS-CoV-2 infection and should not be used as the sole basis for treatment or other patient management decisions.  A negative result may occur with improper specimen collection / handling, submission of specimen other than nasopharyngeal swab, presence of viral mutation(s) within the areas targeted by this assay, and inadequate number of viral copies (<250 copies / mL). A negative result must be combined with clinical observations, patient history, and epidemiological information.  Fact Sheet for Patients:   StrictlyIdeas.no  Fact Sheet for Healthcare Providers: BankingDealers.co.za  This test is not yet approved or  cleared by the [place] FDA and has been authorized for detection and/or diagnosis of SARS-CoV-2 by FDA under an Emergency Use Authorization (EUA).  This EUA will remain in effect (meaning this test can be used) for the duration of the COVID-19 declaration under Section 564(b)(1) of the Act, 21 U.S.C. section 360bbb-3(b)(1), unless the authorization is terminated or revoked sooner.  Performed at [facility], [address], [place], [place] [address]   Culture, Urine     Status: Abnormal  (Preliminary result)   Collection Time: 05/28/20 12:29 PM   Specimen: Urine, Clean Catch  Result Value Ref Range Status   Specimen Description   Final    URINE, CLEAN CATCH Performed at [facility], [address] [place], [place] [address]    Special Requests   Final    NONE Performed at [facility], [address], [place], [place] [address]    Culture >=100,000 COLONIES/mL GRAM NEGATIVE RODS (A)  Final   Report Status PENDING  Incomplete  Culture, blood (routine x 2)     Status: None (Preliminary result)   Collection Time: June 18, 2020  1:50 PM   Specimen: BLOOD  Result Value Ref Range Status   Specimen Description BLOOD RIGHT ANTECUBITAL  Final   Special Requests   Final    BOTTLES DRAWN AEROBIC AND ANAEROBIC Blood Culture adequate volume   Culture   Final    NO GROWTH < 24 HOURS Performed at [facility] [address], [place], [place] [address]    Report Status PENDING  Incomplete  Culture, blood (routine x 2)     Status: None (Preliminary result)   Collection Time: 06-18-2020  1:51 PM   Specimen: BLOOD RIGHT HAND  Result Value Ref Range Status   Specimen Description BLOOD RIGHT HAND  Final   Special Requests   Final    BOTTLES DRAWN AEROBIC AND ANAEROBIC Blood Culture adequate volume   Culture   Final    NO GROWTH < 24 HOURS Performed at [facility], [place] [address] [place], [place] [address]    Report Status PENDING  Incomplete     Radiology Studies: EEG adult  Result Date: 2020/06/18 Laura Havens, MD     06/18/20  6:21 PM Patient Name: Laura Atkins MRN: [id number] Epilepsy Attending: Lora Atkins Referring Physician/Provider: DR Donnetta Simpers Date: 06-18-2020 Duration: 25.01 mins Patient history: 79yo F with h/o seizures and ams. EEG to evaluate for seizure. Level of alertness: Awake AEDs during EEG study: None Technical aspects: This EEG study was done with scalp electrodes positioned according to the 10-20 International system of electrode  placement. Electrical activity was acquired at a sampling rate of 500Hz  and reviewed with a high frequency filter of 70Hz  and a low frequency filter of 1Hz . EEG data were recorded  continuously and digitally stored. Description: The posterior dominant rhythm consists of 7.5-8 Hz activity of moderate voltage (25-35 uV) seen predominantly in posterior head regions, symmetric and reactive to eye opening and eye closing. EEG showed continuous generalized polymorphic 3 to 6 Hz theta-delta slowing. Hyperventilation and photic stimulation were not performed.   ABNORMALITY -Continuous slow, generalized IMPRESSION: This study is suggestive of mild diffuse encephalopathy, nonspecific etiology. No seizures or epileptiform discharges were seen throughout the recording. Priyanka Shelia Media, MD [facility]

## 2020-05-30 NOTE — Progress Notes (Signed)
OT Cancellation Note  Patient Details Name: Laura Atkins MRN: [id number] DOB: 07-Sep-1941   Cancelled Treatment:    Reason Eval/Treat Not Completed: Fatigue/lethargy limiting ability to participate--family in room reporting patient needs to get a nap and requesting OT to return at a later time.  Will follow and see as able.   Jolaine Artist, OT Acute Rehabilitation Services Pager [phone] Office [phone]   [name] 05/30/2020, 11:43 AM

## 2020-05-30 NOTE — Progress Notes (Addendum)
Hypoglycemic Event  CBG: 53  Treatment: regular soda  Symptoms: none  Follow-up CBG: Time: 2150 CBG Result:80  Possible Reasons for Event: poor appetite  Comments/MD notified: yes, Zierie Ghosh    Kamani [employer]

## 2020-05-31 ENCOUNTER — Telehealth: Payer: Self-pay

## 2020-05-31 LAB — BASIC METABOLIC PANEL
Anion gap: 14 (ref 5–15)
BUN: 16 mg/dL (ref 8–23)
CO2: 23 mmol/L (ref 22–32)
Calcium: 9.6 mg/dL (ref 8.9–10.3)
Chloride: 105 mmol/L (ref 98–111)
Creatinine, Ser: 1.32 mg/dL — ABNORMAL HIGH (ref 0.44–1.00)
GFR calc Af Amer: 45 mL/min — ABNORMAL LOW (ref >60)
GFR calc non Af Amer: 39 mL/min — ABNORMAL LOW (ref >60)
Glucose, Bld: 119 mg/dL — ABNORMAL HIGH (ref 70–99)
Potassium: 3.9 mmol/L (ref 3.5–5.1)
Sodium: 142 mmol/L (ref 135–145)

## 2020-05-31 LAB — GLUCOSE, CAPILLARY
Glucose-Capillary: 90 mg/dL (ref 70–99)
Glucose-Capillary: 135 mg/dL — ABNORMAL HIGH (ref 70–99)
Glucose-Capillary: 187 mg/dL — ABNORMAL HIGH (ref 70–99)
Glucose-Capillary: 92 mg/dL (ref 70–99)

## 2020-05-31 LAB — CBC WITH DIFFERENTIAL/PLATELET
Abs Immature Granulocytes: 0.02 10*3/uL (ref 0.00–0.07)
Basophils Absolute: 0 10*3/uL (ref 0.0–0.1)
Basophils Relative: 0 %
Eosinophils Absolute: 0.3 10*3/uL (ref 0.0–0.5)
Eosinophils Relative: 5 %
HCT: 34.8 % — ABNORMAL LOW (ref 36.0–46.0)
Hemoglobin: 9.9 g/dL — ABNORMAL LOW (ref 12.0–15.0)
Immature Granulocytes: 0 %
Lymphocytes Relative: 30 %
Lymphs Abs: 1.7 10*3/uL (ref 0.7–4.0)
MCH: 20 pg — ABNORMAL LOW (ref 26.0–34.0)
MCHC: 28.4 g/dL — ABNORMAL LOW (ref 30.0–36.0)
MCV: 70.4 fL — ABNORMAL LOW (ref 80.0–100.0)
Monocytes Absolute: 0.4 10*3/uL (ref 0.1–1.0)
Monocytes Relative: 8 %
Neutro Abs: 3.1 10*3/uL (ref 1.7–7.7)
Neutrophils Relative %: 57 %
Platelets: 168 10*3/uL (ref 150–400)
RBC: 4.94 MIL/uL (ref 3.87–5.11)
RDW: 15 % (ref 11.5–15.5)
WBC: 5.5 10*3/uL (ref 4.0–10.5)
nRBC: 0 % (ref 0.0–0.2)

## 2020-05-31 LAB — URINE CULTURE: Culture: >=100000 — AB

## 2020-05-31 MED ORDER — INSULIN ASPART 100 UNIT/ML ~~LOC~~ SOLN
0.0000 [IU] | Freq: Three times a day (TID) | SUBCUTANEOUS | Status: DC
  Administered 2020-05-31: 1 [IU] via SUBCUTANEOUS
  Administered 2020-06-02 – 2020-06-03 (×4): 2 [IU] via SUBCUTANEOUS
  Administered 2020-06-04: 1 [IU] via SUBCUTANEOUS

## 2020-05-31 MED ORDER — CEPHALEXIN 500 MG PO CAPS
500.0000 mg | ORAL_CAPSULE | Freq: Three times a day (TID) | ORAL | Status: AC
  Administered 2020-05-31 – 2020-06-01 (×4): 500 mg via ORAL
  Filled 2020-05-31 (×5): qty 1

## 2020-05-31 NOTE — Telephone Encounter (Signed)
Idell Pickles (daughter) wanted you to be aware

## 2020-05-31 NOTE — Telephone Encounter (Signed)
Called to advise Mom is in the hosp

## 2020-05-31 NOTE — Progress Notes (Signed)
Physical Therapy Treatment Patient Details Name: Laura Atkins MRN: [id number] DOB: Jun 30, 1941 Today's Date: 05/31/2020    History of Present Illness 79 y.o. female, history of alpha thalassemia, anxiety, diabetes mellitus, GERD, hyperlipidemia, hypertension, seizures(mentioned in past medical history but no further details), and was brought to ED 05/27/20 by her daughter secondary to concerns of seizure. AMS; Daughter stated that over the last 3 weeks patient has not been herself, she has been confused, had poor appetite and sleeping most of the day, unable to walk without significant assistance. Prior to that a&ox4 walking with walker. +AKI, encephalopathy, UTI, has been pulling out IVs, catheter    PT Comments    Patient remains confused, however flat affect and not impulsive as when previously seen by PT. She required +2 min assist overall for mobility due to decr cognition (for safety) and due to fatigue (to bring a chair for her to sit in when she fatigued). Noted family attempting to arrange 24/7 assist for discharge home. If they can arrange, anticipate she will soon be safe to mobilize with +1 assist (especially in familiar home environment).     Follow Up Recommendations  SNF;Supervision/Assistance - 24 hour     Equipment Recommendations  Other (comment) (TBD if no SNF)    Recommendations for Other Services       Precautions / Restrictions Precautions Precautions: Fall;Other (comment) Precaution Comments: AMS    Mobility  Bed Mobility Overal bed mobility: Needs Assistance Bed Mobility: Supine to Sit     Supine to sit: Min assist     General bed mobility comments: incr time for pt to scoot out to EOB and get feet to floor--only required assist with final scoot to EOB (after multiple unsuccessful attempts)  Transfers Overall transfer level: Needs assistance Equipment used: Rolling walker (2 wheeled) Transfers: Sit to/from [facility] Sit to Stand:  Min assist;+2 physical assistance;+2 safety/equipment;From elevated surface (elevated bed to simulate home) Stand pivot transfers: Min assist;+2 physical assistance;From elevated surface       General transfer comment: assist for anterior translation over BOS; vc/tactile cues for hand placement from EOB and BSC  Ambulation/Gait Ambulation/Gait assistance: Min assist;+2 safety/equipment Gait Distance (Feet): 10 Feet Assistive device: Rolling walker (2 wheeled) Gait Pattern/deviations: Step-to pattern;Decreased stride length;Shuffle;Trunk flexed Gait velocity: very slow   General Gait Details: including initial 1/4 turn from [facility] to walk towards door; recliner brought to pt due to fatigue   Stairs             Wheelchair Mobility    Modified Rankin (Stroke Patients Only)       Balance Overall balance assessment: Needs assistance Sitting-balance support: No upper extremity supported;Feet unsupported Sitting balance-Leahy Scale: Good Sitting balance - Comments: able to weight shift and alternately scoot rt/lt hips to get to EOB   Standing balance support: Single extremity supported Standing balance-Leahy Scale: Poor Standing balance comment: minimal use of UEs on RW, however does need support for safety                            Cognition Arousal/Alertness: Awake/alert Behavior During Therapy: Flat affect Overall Cognitive Status: Impaired/Different from baseline Area of Impairment: Orientation;Attention;Memory;Following commands;Safety/judgement;Awareness;Problem solving                 Orientation Level: Place;Time;Situation (even with options, clues could not choose correct options) Current Attention Level: Sustained Memory: Decreased recall of precautions;Decreased short-term memory Following Commands: Follows one step  commands with increased time Safety/Judgement: Decreased awareness of safety;Decreased awareness of deficits Awareness:   (pre-intellectual) Problem Solving: Slow processing;Decreased initiation;Difficulty sequencing;Requires verbal cues;Requires tactile cues        Exercises      General Comments General comments (skin integrity, edema, etc.): RN reports haldol overnight and need for restraints due to sundowning      Pertinent Vitals/Pain Pain Assessment: Faces Faces Pain Scale: Hurts even more Pain Location: left lower legs when touched for foot placement Pain Descriptors / Indicators: Guarding;Grimacing Pain Intervention(s): Limited activity within patient's tolerance    Home Living                      Prior Function            PT Goals (current goals can now be found in the care plan section) Acute Rehab PT Goals Patient Stated Goal: pt unable; daughter wants pt to be able to walk safely Time For Goal Achievement: 06/12/20 Potential to Achieve Goals: Good Progress towards PT goals: Progressing toward goals    Frequency    Min 2X/week      PT Plan Current plan remains appropriate    Co-evaluation PT/OT/SLP Co-Evaluation/Treatment: Yes Reason for Co-Treatment: Necessary to address cognition/behavior during functional activity;To address functional/ADL transfers;For patient/therapist safety PT goals addressed during session: Mobility/safety with mobility;Balance;Proper use of DME        AM-PAC PT "6 Clicks" Mobility   Outcome Measure  Help needed turning from your back to your side while in a flat bed without using bedrails?: A Little Help needed moving from lying on your back to sitting on the side of a flat bed without using bedrails?: A Little Help needed moving to and from a bed to a chair (including a wheelchair)?: A Little Help needed standing up from a chair using your arms (e.g., wheelchair or bedside chair)?: A Little Help needed to walk in hospital room?: A Little Help needed climbing 3-5 steps with a railing? : Total 6 Click Score: 16    End of Session  Equipment Utilized During Treatment: Gait belt Activity Tolerance: Patient limited by fatigue Patient left: in chair;with call bell/phone within reach;with chair alarm set (RN okayed no wrist restraints; pt set up for breakfast) Nurse Communication: Mobility status;Other (comment) (strong enough for [facility]) PT Visit Diagnosis: Muscle weakness (generalized) (M62.81);Difficulty in walking, not elsewhere classified (R26.2)     Time: 0998-3382 PT Time Calculation (min) (ACUTE ONLY): 29 min  Charges:  $Gait Training: 8-22 mins                      Arby Barrette, PT Pager [phone]    Rexanne Mano 05/31/2020, 9:30 AM

## 2020-05-31 NOTE — Telephone Encounter (Signed)
noted 

## 2020-05-31 NOTE — Evaluation (Signed)
Occupational Therapy Evaluation Patient Details Name: Laura Atkins MRN: [id number] DOB: [birth year] Today's Date: 05/31/2020    History of Present Illness 79 y.o. female, history of alpha thalassemia, anxiety, diabetes mellitus, GERD, hyperlipidemia, hypertension, seizures(mentioned in past medical history but no further details), and was brought to ED 05/27/20 by her daughter secondary to concerns of seizure. AMS; Daughter stated that over the last 3 weeks patient has not been herself, she has been confused, had poor appetite and sleeping most of the day, unable to walk without significant assistance. Prior to that a&ox4 walking with walker. +AKI, encephalopathy, UTI, has been pulling out IVs, catheter   Clinical Impression   Patient admitted for above and limited by problem list below, including impaired cognition, decreased activity tolerance, impaired balance and generalized weakness. PTA, per chart review from PT discussion with daughter, patient modified independent with ADLs, mobility using RW.  Currently requires min assist +2 for toilet transfers, total assist for toileting, mod-max assist for UB ADLs and max assist +2 for LB ADLs.  She is oriented to self only (unable to choose correct options with choices and reoriented to place, time and situation), presents with decreased awareness, problem solving and recall-- but able to follow 1 step commands with increased time.  Based on performance today, patient will benefit from continued OT services while admitted and after dc at SNF level to optimize independence and safety with ADLs, mobility.     Follow Up Recommendations  SNF;Supervision/Assistance - 24 hour    Equipment Recommendations  3 in 1 bedside commode    Recommendations for Other Services       Precautions / Restrictions Precautions Precautions: Fall;Other (comment) Precaution Comments: AMS Restrictions Weight Bearing Restrictions: No      Mobility Bed  Mobility Overal bed mobility: Needs Assistance Bed Mobility: Supine to Sit     Supine to sit: Min assist     General bed mobility comments: increased time to problem solve technique and initate, min assist to scoot towards EOB   Transfers Overall transfer level: Needs assistance Equipment used: Rolling walker (2 wheeled) Transfers: Sit to/from [facility] Sit to Stand: Min assist;+2 physical assistance;+2 safety/equipment;From elevated surface Stand pivot transfers: Min assist;+2 physical assistance;From elevated surface       General transfer comment: assist for anterior translation over BOS; vc/tactile cues for hand placement from EOB and BSC    Balance Overall balance assessment: Needs assistance Sitting-balance support: No upper extremity supported;Feet unsupported Sitting balance-Leahy Scale: Good Sitting balance - Comments: able to reach towards feet to adjust socks, close supervision for safety    Standing balance support: During functional activity;Bilateral upper extremity supported;Single extremity supported Standing balance-Leahy Scale: Poor Standing balance comment: relies on UE support                           ADL either performed or assessed with clinical judgement   ADL Overall ADL's : Needs assistance/impaired     Grooming: Supervision/safety;Sitting;Wash/dry hands   Upper Body Bathing: Sitting;Moderate assistance   Lower Body Bathing: Maximal assistance;+2 for physical assistance;+2 for safety/equipment;Sit to/from stand   Upper Body Dressing : Sitting;Maximal assistance   Lower Body Dressing: Maximal assistance;+2 for physical assistance;+2 for safety/equipment;Sit to/from stand Lower Body Dressing Details (indicate cue type and reason): able to adjust socks, min assist +2 sit to stand and relies on BUE support Toilet Transfer: Minimal assistance;+2 for physical assistance;+2 for safety/equipment;Stand-pivot;BSC;RW    Toileting- Clothing Manipulation  and Hygiene: Total assistance;+2 for safety/equipment;+2 for physical assistance;Sit to/from stand       Functional mobility during ADLs: Minimal assistance;+2 for safety/equipment;+2 for physical assistance;Rolling walker;Cueing for sequencing;Cueing for safety General ADL Comments: patient limited by impaired cognition, balance, weakness      Vision   Vision Assessment?: No apparent visual deficits     Perception     Praxis      Pertinent Vitals/Pain Pain Assessment: Faces Faces Pain Scale: Hurts even more Pain Location: left lower legs when touched for foot placement Pain Descriptors / Indicators: Guarding;Grimacing Pain Intervention(s): Limited activity within patient's tolerance;Monitored during session;Repositioned     Hand Dominance     Extremity/Trunk Assessment Upper Extremity Assessment Upper Extremity Assessment: Generalized weakness   Lower Extremity Assessment Lower Extremity Assessment: Defer to PT evaluation   Cervical / Trunk Assessment Cervical / Trunk Assessment: Other exceptions Cervical / Trunk Exceptions: morbid obesity   Communication Communication Communication: No difficulties   Cognition Arousal/Alertness: Awake/alert Behavior During Therapy: Flat affect Overall Cognitive Status: Impaired/Different from baseline Area of Impairment: Orientation;Attention;Memory;Following commands;Safety/judgement;Awareness;Problem solving                 Orientation Level: Place;Time;Situation;Disoriented to Current Attention Level: Sustained Memory: Decreased recall of precautions;Decreased short-term memory Following Commands: Follows one step commands with increased time Safety/Judgement: Decreased awareness of safety;Decreased awareness of deficits Awareness:  (pre-intellectual) Problem Solving: Slow processing;Decreased initiation;Difficulty sequencing;Requires verbal cues;Requires tactile cues General Comments:  patient given choices for orientation and unable to select correct choices, oriented to self only; follows 1 step commands but noted decreased recall, problem solving, and awareness to deficits    General Comments  RN reports haldol overnight and need for restraints due to sundowning    Exercises     Shoulder Instructions      Home Living Family/patient expects to be discharged to:: Private residence Living Arrangements: Spouse/significant other;Children;Other relatives (daughter, granddaughter) Available Help at Discharge: Family;Available PRN/intermittently (daughter works, Therapist, nutritional school) Type of Home: [employer] Access: [place]: One level     Bathroom Shower/Tub: Other (comment) (has not been able to use new walk in tub 2/2 confusion )   Bathroom Toilet: Standard     Home Equipment: Walker - 2 wheels;Walker - 4 wheels;Bedside commode;Grab bars - tub/shower (having step in tub installed )   Additional Comments: home setup from PT eval, pt unable to report       Prior Functioning/Environment Level of Independence: Independent with assistive device(s)        Comments: 3 weeks ago using RW modified independent for mobility and ADLs         OT Problem List: Decreased strength;Decreased activity tolerance;Impaired balance (sitting and/or standing);Decreased coordination;Decreased cognition;Decreased safety awareness;Decreased knowledge of use of DME or AE;Decreased knowledge of precautions;Obesity      OT Treatment/Interventions: Self-care/ADL training;DME and/or AE instruction;Therapeutic exercise;Therapeutic activities;Cognitive remediation/compensation;Patient/family education;Balance training    OT Goals(Current goals can be found in the care plan section) Acute Rehab OT Goals Patient Stated Goal: pt unable; daughter wants pt to be able to walk safely Time For Goal Achievement: 06/14/20 Potential to Achieve Goals: Good  OT Frequency: Min  2X/week   Barriers to D/C:            Co-evaluation PT/OT/SLP Co-Evaluation/Treatment: Yes Reason for Co-Treatment: Necessary to address cognition/behavior during functional activity;For patient/therapist safety;To address functional/ADL transfers PT goals addressed during session: Mobility/safety with mobility;Balance;Proper use of DME OT goals addressed during session:  ADL's and self-care      AM-PAC OT "6 Clicks" Daily Activity     Outcome Measure Help from another person eating meals?: A Little Help from another person taking care of personal grooming?: A Little Help from another person toileting, which includes using toliet, bedpan, or urinal?: Total Help from another person bathing (including washing, rinsing, drying)?: A Lot Help from another person to put on and taking off regular upper body clothing?: A Lot Help from another person to put on and taking off regular lower body clothing?: Total 6 Click Score: 12   End of Session Equipment Utilized During Treatment: Gait belt;Rolling walker Nurse Communication: Mobility status  Activity Tolerance: Patient tolerated treatment well Patient left: in chair;with call bell/phone within reach;with chair alarm set  OT Visit Diagnosis: Other abnormalities of gait and mobility (R26.89);Muscle weakness (generalized) (M62.81);Other symptoms and signs involving cognitive function                Time: 5329-9242 OT Time Calculation (min): 33 min Charges:  OT General Charges $OT Visit: 1 Visit OT Evaluation $OT Eval Moderate Complexity: 1 Mod  Jolaine Artist, OT Acute Rehabilitation Services Pager [phone] Office [phone]   [name] 05/31/2020, 10:32 AM

## 2020-05-31 NOTE — Progress Notes (Signed)
Inpatient Diabetes Program Recommendations  AACE/ADA: New Consensus Statement on Inpatient Glycemic Control (2015)  Target Ranges:  Prepandial:   less than 140 mg/dL      Peak postprandial:   less than 180 mg/dL (1-2 hours)      Critically ill patients:  140 - 180 mg/dL   Lab Results  Component Value Date   GLUCAP 135 (H) 05/31/2020   HGBA1C 9.5 (H) 05/27/2020    Review of Glycemic Control Results for Laura Atkins, Laura Atkins (MRN [id number]) as of 05/31/2020 14:23  Ref. Range 05/30/2020 08:25 05/30/2020 11:47 05/30/2020 15:46  Glucose-Capillary Latest Ref Range: 70 - 99 mg/dL 127 (H) Novolog 1 unit + 70/30 20 units 105 (H) 103 (H)   Results for Laura Atkins, Laura Atkins (MRN [id number]) as of 05/31/2020 14:23  Ref. Range 05/30/2020 20:38 05/30/2020 21:16 05/30/2020 21:50 05/31/2020 06:06 05/31/2020 11:42  Glucose-Capillary Latest Ref Range: 70 - 99 mg/dL 53 (L) 58 (L) 80 90 135 (H)   Diabetes history:  DM2  Outpatient Diabetes medications:  70/30 30 units bid  Current orders for Inpatient glycemic control:  70/30 20 units BID Novolog 0-9 units TID  Inpatient Diabetes Program Recommendations:     Received 1 unit of Novolog correction at 0800 on 9/6 and CBG's decreased.  Please consider,  Novolog very sensitive correction 0-6 units tid with meals  Will continue to follow while inpatient.  Thank you, Reche Dixon, RN, BSN Diabetes Coordinator Inpatient Diabetes Program [phone] (team pager from 8a-5p)

## 2020-05-31 NOTE — Progress Notes (Signed)
PROGRESS NOTE  [name] AVW:[id number] DOB: [birth year] Laura Atkins PCP: Laura Helper, Laura Atkins   LOS: 4 days   Brief Narrative / Interim history: 79 year old female with history of alpha thalassemia, diabetes mellitus, hypertension, hyperlipidemia, was brought to the ED by the daughter due to concerns of patient having a seizure.  Daughter reports an episode of about a minute longer generalized tonic-clonic seizures, eye rolling back and remained confused 5 minutes after that.  Daughter reports that her sugar was around mid 300s around that time, she carries a log with the patient CBGs and her sugars usually are in the mid to upper 200s.  Daughter also mentions that over the last 3 weeks patient has not been herself, she has been confused, had poor appetite and sleeping most of the day, unable to walk without significant assistance.  Prior to 3 weeks ago patient has never had any memory problems, alert and oriented x4, ambulating with a walker, however daughter reports an episode of sundowning with her prior hospital stay.  She has been complaining of intermittent headaches but that is not unusual for her.  Daughter denies any prior history of seizures.  She is not drinking EtOH, she is not a smoker.  In the ED she was found to have acute kidney injury     Today, patient still confused, noted to be agitated/possibly sundowning overnight.  This a.m. denies any new complaints, oriented to self, date of birth.  Denies any chest pain, abdominal pain, nausea/vomiting, fever/chills.     Assessment & Plan:  Acute metabolic encephalopathy ??Concern for seizure Vs UTI Possibly underlying undiagnosed dementia No witnessed seizures since admission History of provoked seizure in 2013 2/2 Xanax withdrawal, was placed on Keppra and has been off for the last 6 years CT head showed no acute or reversible finding MRI brain showed no acute intracranial abnormality EEG showed no seizure or  epileptiform discharges UDS negative Neurology consulted, no need for AED unless unprovoked seizures, outpatient follow-up for possibly underlying dementia in 2 to 3 months Continue seizure precautions  AKI on chronic kidney disease stage IIIa Baseline creatinine 0.9-1.3, elevated at 2.7 on admission S/p gentle hydration Daily BMP  E. coli UTI UA positive for infection UC growing > 100,000 E. coli BC X 2 NGTD S/p IV ceftriaxone--> switch to p.o. Keflex to complete 5 days  Hypertension BP stable Continue Norvasc, hold benazepril due to AKI  Diabetes mellitus type 2, uncontrolled A1c 9.5, uncontrolled Noted to be hypoglycemic on 05/30/2020, adjusted SSI SSI, 70/30, accuchecks, hypoglycemic protocol  Microcytic anemia Hemoglobin at baseline Anemia panel folate 10.8, Vit B12 458 Daily CBC  Hyperlipidemia Continue statin  Morbid obesity Lifestyle modification advised     Scheduled Meds: . amLODipine  5 mg Oral Daily  . aspirin EC  81 mg Oral Daily  . atorvastatin  40 mg Oral Daily  . cephALEXin  500 mg Oral Q8H  . heparin  5,000 Units Subcutaneous Q8H  . insulin aspart  0-9 Units Subcutaneous TID WC  . insulin aspart protamine- aspart  20 Units Subcutaneous BID WC  . pantoprazole  40 mg Oral Daily   Continuous Infusions:  PRN Meds:.  Diet Orders (From admission, onward)    Start     Ordered   05/27/20 1926  Diet heart healthy/carb modified Room service appropriate? Yes; Fluid consistency: Thin  Diet effective now       Question Answer Comment  Diet-HS Snack? Nothing   Room service appropriate? Yes  Fluid consistency: Thin      05/27/20 1925          DVT prophylaxis: heparin injection 5,000 Units Start: 05/27/20 2200 SCDs Start: 05/27/20 1926     Code Status: Full Code  Family Communication: Discussed with daughter at bedside on 05/30/20  Status is: Inpatient  Remains inpatient appropriate because:Altered mental status  Dispo: The patient is from:  Home              Anticipated d/c is to: SNF              Anticipated d/c date is: 2 days              Patient currently is not medically stable to d/c.  Consultants:  Neurology, Dr. Lorrin Goodell  Procedures:  None  Microbiology  Urine cultures -E. coli  Antimicrobials: Ceftriaxone 9/4 >> 9/7 P.o. Keflex   Objective: Vitals:   05/31/20 0312 05/31/20 0728 05/31/20 1141 05/31/20 1609  BP: (!) 159/75 (!) 150/59 119/67 (!) 147/63  Pulse: 81 77 72 79  Resp: 18 16 18 18   Temp: 97.8 F (36.6 C) (!) 97.5 F (36.4 C) 98.1 F (36.7 C) 98 F (36.7 C)  TempSrc: Oral Axillary Oral Oral  SpO2: 100% 100% 100% 99%  Weight:      Height:        Intake/Output Summary (Last 24 hours) at 05/31/2020 1637 Last data filed at 05/30/2020 2349 Gross per 24 hour  Intake 246 ml  Output 800 ml  Net -554 ml   Filed Weights   05/27/20 1301  Weight: 122.5 kg    Examination:   General: NAD, confused, awake/alert   Cardiovascular: S1, S2 present  Respiratory: CTAB  Abdomen: Soft, nontender, nondistended, bowel sounds present  Musculoskeletal: No bilateral pedal edema noted  Skin: Normal  Psychiatry: Confused   Neurology: Moves all extremities spontaneously    Data Reviewed: I have independently reviewed following labs and imaging studies   CBC: Recent Labs  Lab 05/27/20 1357 05/28/20 0419 05/29/20 1059 05/30/20 0400 05/31/20 0743  WBC 6.5 5.1 4.9 4.7 5.5  NEUTROABS 4.4  --  2.5 2.1 3.1  HGB 10.7* 9.9* 10.2* 10.0* 9.9*  HCT 37.3 34.0* 35.7* 34.2* 34.8*  MCV 71.9* 71.1* 70.6* 70.2* 70.4*  PLT 193 184 184 179 326   Basic Metabolic Panel: Recent Labs  Lab 05/27/20 1357 05/28/20 0419 05/29/20 1059 05/30/20 0400 05/31/20 0743  NA 136 140 142 143 142  K 4.8 4.3 4.3 4.0 3.9  CL 97* 104 104 108 105  CO2 27 27 27 27 23   GLUCOSE 264* 107* 223* 116* 119*  BUN 64* 47* 22 17 16   CREATININE 2.71* 1.78* 1.51* 1.19* 1.32*  CALCIUM 9.6 9.1 9.6 9.6 9.6  MG 2.4  --   --   --    --    Liver Function Tests: Recent Labs  Lab 05/27/20 1357 05/28/20 0419  AST 26 24  ALT 25 23  ALKPHOS 95 78  BILITOT 0.4 0.4  PROT 8.1 7.0  ALBUMIN 4.2 3.6   Coagulation Profile: No results for input(s): INR, PROTIME in the last 168 hours. HbA1C: No results for input(s): HGBA1C in the last 72 hours. CBG: Recent Labs  Lab 05/30/20 2038 05/30/20 2116 05/30/20 2150 05/31/20 0606 05/31/20 1142  GLUCAP 53* 58* 80 90 135*    Recent Results (from the past 240 hour(s))  SARS Coronavirus 2 by RT PCR (hospital order, performed in [facility] hospital lab) Nasopharyngeal Nasopharyngeal  Swab     Status: None   Collection Time: 05/27/20  7:31 PM   Specimen: Nasopharyngeal Swab  Result Value Ref Range Status   SARS Coronavirus 2 NEGATIVE NEGATIVE Final    Comment: (NOTE) SARS-CoV-2 target nucleic acids are NOT DETECTED.  The SARS-CoV-2 RNA is generally detectable in upper and lower respiratory specimens during the acute phase of infection. The lowest concentration of SARS-CoV-2 viral copies this assay can detect is 250 copies / mL. A negative result does not preclude SARS-CoV-2 infection and should not be used as the sole basis for treatment or other patient management decisions.  A negative result may occur with improper specimen collection / handling, submission of specimen other than nasopharyngeal swab, presence of viral mutation(s) within the areas targeted by this assay, and inadequate number of viral copies (<250 copies / mL). A negative result must be combined with clinical observations, patient history, and epidemiological information.  Fact Sheet for Patients:   StrictlyIdeas.no  Fact Sheet for Healthcare Providers: BankingDealers.co.za  This test is not yet approved or  cleared by the [place] FDA and has been authorized for detection and/or diagnosis of SARS-CoV-2 by FDA under an Emergency Use Authorization  (EUA).  This EUA will remain in effect (meaning this test can be used) for the duration of the COVID-19 declaration under Section 564(b)(1) of the Act, 21 U.S.C. section 360bbb-3(b)(1), unless the authorization is terminated or revoked sooner.  Performed at [facility], [address], [place], [place] [address]   Culture, Urine     Status: Abnormal   Collection Time: 05/28/20 12:29 PM   Specimen: Urine, Clean Catch  Result Value Ref Range Status   Specimen Description   Final    URINE, CLEAN CATCH Performed at [facility], [address] [place], [place] [address]    Special Requests   Final    NONE Performed at [facility], [address] [place], [place] [address]    Culture >=100,000 COLONIES/mL ESCHERICHIA COLI (A)  Final   Report Status 05/31/2020 FINAL  Final   Organism ID, Bacteria ESCHERICHIA COLI (A)  Final      Susceptibility   Escherichia coli - MIC*    AMPICILLIN >=32 RESISTANT Resistant     CEFAZOLIN <=4 SENSITIVE Sensitive     CEFTRIAXONE <=0.25 SENSITIVE Sensitive     CIPROFLOXACIN <=0.25 SENSITIVE Sensitive     GENTAMICIN >=16 RESISTANT Resistant     IMIPENEM <=0.25 SENSITIVE Sensitive     NITROFURANTOIN <=16 SENSITIVE Sensitive     TRIMETH/SULFA >=320 RESISTANT Resistant     AMPICILLIN/SULBACTAM 16 INTERMEDIATE Intermediate     PIP/TAZO <=4 SENSITIVE Sensitive     * >=100,000 COLONIES/mL ESCHERICHIA COLI  Culture, blood (routine x 2)     Status: None (Preliminary result)   Collection Time: 05/29/20  1:50 PM   Specimen: BLOOD  Result Value Ref Range Status   Specimen Description BLOOD RIGHT ANTECUBITAL  Final   Special Requests   Final    BOTTLES DRAWN AEROBIC AND ANAEROBIC Blood Culture adequate volume   Culture   Final    NO GROWTH 2 DAYS Performed at [facility], [address], [place], [place] [address]    Report Status PENDING  Incomplete  Culture, blood (routine x 2)     Status: None (Preliminary result)   Collection Time: 05/29/20   1:51 PM   Specimen: BLOOD RIGHT HAND  Result Value Ref Range Status   Specimen Description BLOOD RIGHT HAND  Final   Special Requests   Final    BOTTLES DRAWN AEROBIC AND ANAEROBIC Blood Culture adequate volume   Culture   Final    NO GROWTH 2 DAYS Performed at [facility], [address], [place], [place] [address]    Report Status PENDING  Incomplete     Radiology Studies: No results found.  Alma Friendly, Laura Atkins [facility]

## 2020-05-31 NOTE — Progress Notes (Signed)
Urine culture came back with e.coli that is sens to cefazolin. D/w Dr. Horris Latino and we will optimize her ceftriaxone to PO keflex to complete a total of 5d.  Onnie Boer, PharmD, BCIDP, AAHIVP, CPP Infectious Disease Pharmacist 05/31/2020 11:00 AM

## 2020-06-01 DIAGNOSIS — B999 Unspecified infectious disease: Secondary | ICD-10-CM

## 2020-06-01 LAB — CBC WITH DIFFERENTIAL/PLATELET
Abs Immature Granulocytes: 0.02 10*3/uL (ref 0.00–0.07)
Basophils Absolute: 0 10*3/uL (ref 0.0–0.1)
Basophils Relative: 0 %
Eosinophils Absolute: 0.2 10*3/uL (ref 0.0–0.5)
Eosinophils Relative: 3 %
HCT: 33.4 % — ABNORMAL LOW (ref 36.0–46.0)
Hemoglobin: 9.4 g/dL — ABNORMAL LOW (ref 12.0–15.0)
Immature Granulocytes: 0 %
Lymphocytes Relative: 32 %
Lymphs Abs: 2.2 10*3/uL (ref 0.7–4.0)
MCH: 19.7 pg — ABNORMAL LOW (ref 26.0–34.0)
MCHC: 28.1 g/dL — ABNORMAL LOW (ref 30.0–36.0)
MCV: 70 fL — ABNORMAL LOW (ref 80.0–100.0)
Monocytes Absolute: 0.7 10*3/uL (ref 0.1–1.0)
Monocytes Relative: 10 %
Neutro Abs: 3.8 10*3/uL (ref 1.7–7.7)
Neutrophils Relative %: 55 %
Platelets: 169 10*3/uL (ref 150–400)
RBC: 4.77 MIL/uL (ref 3.87–5.11)
RDW: 14.8 % (ref 11.5–15.5)
WBC: 6.8 10*3/uL (ref 4.0–10.5)
nRBC: 0 % (ref 0.0–0.2)

## 2020-06-01 LAB — GLUCOSE, CAPILLARY
Glucose-Capillary: 143 mg/dL — ABNORMAL HIGH (ref 70–99)
Glucose-Capillary: 66 mg/dL — ABNORMAL LOW (ref 70–99)
Glucose-Capillary: 101 mg/dL — ABNORMAL HIGH (ref 70–99)
Glucose-Capillary: 70 mg/dL (ref 70–99)

## 2020-06-01 LAB — BASIC METABOLIC PANEL
Anion gap: 10 (ref 5–15)
BUN: 22 mg/dL (ref 8–23)
CO2: 24 mmol/L (ref 22–32)
Calcium: 9.6 mg/dL (ref 8.9–10.3)
Chloride: 105 mmol/L (ref 98–111)
Creatinine, Ser: 1.54 mg/dL — ABNORMAL HIGH (ref 0.44–1.00)
GFR calc Af Amer: 38 mL/min — ABNORMAL LOW (ref >60)
GFR calc non Af Amer: 32 mL/min — ABNORMAL LOW (ref >60)
Glucose, Bld: 123 mg/dL — ABNORMAL HIGH (ref 70–99)
Potassium: 3.7 mmol/L (ref 3.5–5.1)
Sodium: 139 mmol/L (ref 135–145)

## 2020-06-01 MED ORDER — INSULIN ASPART PROT & ASPART (70-30 MIX) 100 UNIT/ML ~~LOC~~ SUSP
10.0000 [IU] | Freq: Two times a day (BID) | SUBCUTANEOUS | Status: DC
  Administered 2020-06-02: 10 [IU] via SUBCUTANEOUS
  Filled 2020-06-01: qty 10

## 2020-06-01 MED ORDER — HALOPERIDOL LACTATE 5 MG/ML IJ SOLN: 2.0000 mg | Freq: Four times a day (QID) | INTRAMUSCULAR | Status: DC | PRN

## 2020-06-01 MED ORDER — HALOPERIDOL LACTATE 5 MG/ML IJ SOLN
2.0000 mg | Freq: Four times a day (QID) | INTRAMUSCULAR | Status: DC | PRN
  Administered 2020-06-02 – 2020-06-04 (×3): 2 mg via INTRAMUSCULAR
  Filled 2020-06-01 (×3): qty 1

## 2020-06-01 NOTE — Progress Notes (Signed)
Physical Therapy Treatment Patient Details Name: Laura Atkins MRN: [id number] DOB: [birth year] Today's Date: 06/01/2020    History of Present Illness 79 y.o. female, history of alpha thalassemia, anxiety, diabetes mellitus, GERD, hyperlipidemia, hypertension, seizures(mentioned in past medical history but no further details), and was brought to ED 05/27/20 by her daughter secondary to concerns of seizure. AMS; Daughter stated that over the last 3 weeks patient has not been herself, she has been confused, had poor appetite and sleeping most of the day, unable to walk without significant assistance. Prior to that a&ox4 walking with walker. +AKI, encephalopathy, UTI, has been pulling out IVs, catheter    PT Comments    Pt very distractible and needing repetitive redirection to task with cuing.  Emphasis on helping problem-solve ways to mobilize by herself, transitions, scooting to EOB, sit to stand and progressing ambulation with the RW.    Follow Up Recommendations  SNF;Supervision/Assistance - 24 hour     Equipment Recommendations       Recommendations for Other Services       Precautions / Restrictions Precautions Precautions: Fall;Other (comment) Precaution Comments: AMS    Mobility  Bed Mobility Overal bed mobility: Needs Assistance Bed Mobility: Supine to Sit     Supine to sit: Min assist     General bed mobility comments: pt given repetitve cuing and extra time to sequence and problem solve.  needed some directional cues and minimal trucal /scoot assist  Transfers Overall transfer level: Needs assistance   Transfers: Sit to/from Stand Sit to Stand: Min assist         General transfer comment: assist for anterior translation over BOS; vc/tactile cues for hand placement from EOB  Ambulation/Gait Ambulation/Gait assistance: Min assist Gait Distance (Feet): 45 Feet Assistive device: Rolling walker (2 wheeled) Gait Pattern/deviations: Step-through  pattern;Decreased step length - right;Decreased step length - left;Decreased stride length   Gait velocity interpretation: <1.31 ft/sec, indicative of household ambulator General Gait Details: cues for posture and assist to help pt continue to mobilize by pushing RW forward.  Stability assist.  Pt with short tentative steps, needing redirection to continue.   Stairs             Wheelchair Mobility    Modified Rankin (Stroke Patients Only)       Balance Overall balance assessment: Needs assistance Sitting-balance support: No upper extremity supported;Feet unsupported Sitting balance-Leahy Scale: Good     Standing balance support: During functional activity;Bilateral upper extremity supported Standing balance-Leahy Scale: Poor Standing balance comment: relies on UE support                            Cognition Arousal/Alertness: Awake/alert Behavior During Therapy: Flat affect;WFL for tasks assessed/performed Overall Cognitive Status: Impaired/Different from baseline (NT formally)                   Orientation Level: Place;Time;Situation;Disoriented to Current Attention Level: Sustained   Following Commands: Follows one step commands with increased time Safety/Judgement: Decreased awareness of safety;Decreased awareness of deficits   Problem Solving: Slow processing        Exercises Other Exercises Other Exercises: warm up hip/knee flex/ext ROM exercise with knee flexion stretch and gross ext resistance.    General Comments General comments (skin integrity, edema, etc.): poseyed in chair with alarm for good measure of safety      Pertinent Vitals/Pain Pain Assessment: Faces Faces Pain Scale: No hurt Pain Intervention(s): Monitored during  session    Home Living                      Prior Function            PT Goals (current goals can now be found in the care plan section) Acute Rehab PT Goals Patient Stated Goal: pt unable;  daughter wants pt to be able to walk safely PT Goal Formulation: With family Time For Goal Achievement: 06/12/20 Potential to Achieve Goals: Good Progress towards PT goals: Progressing toward goals    Frequency    Min 2X/week      PT Plan Current plan remains appropriate    Co-evaluation              AM-PAC PT "6 Clicks" Mobility   Outcome Measure  Help needed turning from your back to your side while in a flat bed without using bedrails?: A Little Help needed moving from lying on your back to sitting on the side of a flat bed without using bedrails?: A Little Help needed moving to and from a bed to a chair (including a wheelchair)?: A Little Help needed standing up from a chair using your arms (e.g., wheelchair or bedside chair)?: A Little Help needed to walk in hospital room?: A Little Help needed climbing 3-5 steps with a railing? : Total 6 Click Score: 16    End of Session   Activity Tolerance: Patient tolerated treatment well Patient left: in chair;with call bell/phone within reach;with chair alarm set Nurse Communication: Mobility status PT Visit Diagnosis: Other abnormalities of gait and mobility (R26.89);Difficulty in walking, not elsewhere classified (R26.2)     Time: 0786-7544 PT Time Calculation (min) (ACUTE ONLY): 22 min  Charges:  $Gait Training: 8-22 mins                     06/01/2020  Ginger Carne., PT Acute Rehabilitation Services [phone]  (pager) [phone]  (office)   [name] 06/01/2020, 11:39 AM

## 2020-06-01 NOTE — Progress Notes (Signed)
PROGRESS NOTE    Laura Atkins  XBM:[id number] DOB: 09-13-1941 DOA: 05/27/2020 PCP: Fayrene Helper, MD   Chief Complaint  Patient presents with  . Seizures    Brief Narrative:  79 year old female with history of alpha thalassemia, diabetes mellitus, hypertension, hyperlipidemia, was brought to the ED by the daughter due to concerns of patient having Babe Anthis seizure.  Daughter reports an episode of about Nikia Levels minute longer generalized tonic-clonic seizures, eye rolling back and remained confused 5 minutes after that.  Daughter reports that her sugar was around mid 300s around that time, she carries Eliot Popper log with the patient CBGs and her sugars usually are in the mid to upper 200s.  Daughter also mentions that over the last 3 weeks patient has not been herself, she has been confused, had poor appetite and sleeping most of the day, unable to walk without significant assistance.  Prior to 3 weeks ago patient has never had any memory problems, alert and oriented x4, ambulating with Cy Bresee walker, however daughter reports an episode of sundowning with her prior hospital stay.  She has been complaining of intermittent headaches but that is not unusual for her.  Daughter denies any prior history of seizures.  She is not drinking EtOH, she is not Nyleah Mcginnis smoker.  In the ED she was found to have acute kidney injury  Assessment & Plan:   Active Problems:   Alpha thalassemia (HCC)   Essential hypertension   GERD   Altered mental status   OSA (obstructive sleep apnea)   Type 2 diabetes mellitus with hyperglycemia (HCC)   Hyperlipidemia associated with type 2 diabetes mellitus ([place])   AKI (acute kidney injury) ([place])  Acute metabolic encephalopathy ??Concern for seizure Vs UTI Possibly underlying undiagnosed dementia No witnessed seizures since admission History of provoked seizure in 2013 2/2 Xanax withdrawal, was placed on Keppra and has been off for the last 6 years CT head showed no acute or reversible  finding MRI brain showed no acute intracranial abnormality EEG showed no seizure or epileptiform discharges UDS negative Neurology consulted, no need for AED unless unprovoked seizures, outpatient follow-up for possibly underlying dementia in 2 to 3 months Continue seizure precautions  AKI on chronic kidney disease stage IIIa Baseline creatinine 0.9-1.3, elevated at 2.7 on admission S/p gentle hydration Daily BMP  E. coli UTI UA positive for infection UC growing > 100,000 E. coli BC X 2 NGTD S/p IV ceftriaxone--> switch to p.o. Keflex to complete 5 days  Hypertension BP stable Continue Norvasc, hold benazepril due to AKI  Diabetes mellitus type 2, uncontrolled A1c 9.5, uncontrolled Noted to be hypoglycemic on 05/30/2020, adjusted SSI SSI, 70/30, accuchecks, hypoglycemic protocol hypoglycemia today, will decrease dose  Microcytic anemia Hemoglobin at baseline Anemia panel folate 10.8, Vit B12 458 Daily CBC  Hyperlipidemia Continue statin  Morbid obesity Lifestyle modification advised  DVT prophylaxis: heparin Code Status: full  Family Communication: none at bedside Disposition:   Status is: Inpatient  Remains inpatient appropriate because:Inpatient level of care appropriate due to severity of illness   Dispo: The patient is from: Home              Anticipated d/c is to: SNF              Anticipated d/c date is: 2 days              Patient currently is not medically stable to d/c.       Consultants:   neurology  Procedures:  EEG  ABNORMALITY -Continuous slow, generalized  IMPRESSION: This study is suggestive of mild diffuse encephalopathy, nonspecific etiology. No seizures or epileptiform discharges were seen throughout the recording.  Echo IMPRESSIONS    1. Left ventricular ejection fraction, by estimation, is 60 to 65%. The  left ventricle has normal function. The left ventricle has no regional  wall motion abnormalities. There is  mild concentric left ventricular  hypertrophy. Left ventricular diastolic  parameters are indeterminate.  2. Right ventricular systolic function is normal. The right ventricular  size is normal. There is normal pulmonary artery systolic pressure.  3. Left atrial size was mildly dilated.  4. Right atrial size was mildly dilated.  5. The mitral valve is normal in structure. Trivial mitral valve  regurgitation. No evidence of mitral stenosis.  6. The aortic valve is tricuspid. Aortic valve regurgitation is not  visualized. No aortic stenosis is present.  7. The inferior vena cava is normal in size with greater than 50%  respiratory variability, suggesting right atrial pressure of 3 mmHg.    Antimicrobials: Anti-infectives (From admission, onward)   Start     Dose/Rate Route Frequency Ordered Stop   05/31/20 1400  cephALEXin (KEFLEX) capsule 500 mg        500 mg Oral Every 8 hours 05/31/20 1058 06/02/20 0559   05/28/20 1600  cefTRIAXone (ROCEPHIN) 1 g in sodium chloride 0.9 % 100 mL IVPB  Status:  Discontinued        1 g 200 mL/hr over 30 Minutes Intravenous Every 24 hours 05/28/20 1125 05/31/20 1058   05/28/20 0800  meropenem (MERREM) 1 g in sodium chloride 0.9 % 100 mL IVPB        1 g 200 mL/hr over 30 Minutes Intravenous  Once 05/28/20 0747 05/28/20 0950   05/28/20 0730  ciprofloxacin (CIPRO) IVPB 400 mg  Status:  Discontinued        400 mg 200 mL/hr over 60 Minutes Intravenous Every 12 hours 05/28/20 0727 05/28/20 0727      Subjective: Aslee Such&Ox1 Pleasantly confused  Objective: Vitals:   06/01/20 0754 06/01/20 1141 06/01/20 1627 06/01/20 2024  BP: (!) 141/59 135/85 136/63 (!) 156/63  Pulse: 85 87 72 73  Resp: 20 18 18 15   Temp: 98.1 F (36.7 C) 98.2 F (36.8 C) 97.8 F (36.6 C) 98.4 F (36.9 C)  TempSrc: Oral Oral  Oral  SpO2: 99% 98% 100% 100%  Weight:      Height:       No intake or output data in the 24 hours ending 06/01/20 2129 Filed Weights   05/27/20  1301  Weight: 122.5 kg    Examination:  General exam: Appears calm and comfortable  Respiratory system: Clear to auscultation. Respiratory effort normal. Cardiovascular system: S1 & S2 heard, RRR. Gastrointestinal system: Abdomen is nondistended, soft and nontender Central nervous system: Alert and oriented x1. No focal neurological deficits. Extremities: no LEE Skin: No rashes, lesions or ulcers    Data Reviewed: I have personally reviewed following labs and imaging studies  CBC: Recent Labs  Lab 05/27/20 1357 05/27/20 1357 05/28/20 0419 05/29/20 1059 05/30/20 0400 05/31/20 0743 06/01/20 0321  WBC 6.5   < > 5.1 4.9 4.7 5.5 6.8  NEUTROABS 4.4  --   --  2.5 2.1 3.1 3.8  HGB 10.7*   < > 9.9* 10.2* 10.0* 9.9* 9.4*  HCT 37.3   < > 34.0* 35.7* 34.2* 34.8* 33.4*  MCV 71.9*   < > 71.1* 70.6* 70.2* 70.4*  70.0*  PLT 193   < > 184 184 179 168 169   < > = values in this interval not displayed.    Basic Metabolic Panel: Recent Labs  Lab 05/27/20 1357 05/27/20 1357 05/28/20 0419 05/29/20 1059 05/30/20 0400 05/31/20 0743 06/01/20 0321  NA 136   < > 140 142 143 142 139  K 4.8   < > 4.3 4.3 4.0 3.9 3.7  CL 97*   < > 104 104 108 105 105  CO2 27   < > 27 27 27 23 24   GLUCOSE 264*   < > 107* 223* 116* 119* 123*  BUN 64*   < > 47* 22 17 16 22   CREATININE 2.71*   < > 1.78* 1.51* 1.19* 1.32* 1.54*  CALCIUM 9.6   < > 9.1 9.6 9.6 9.6 9.6  MG 2.4  --   --   --   --   --   --    < > = values in this interval not displayed.    GFR: Estimated Creatinine Clearance: 42.8 mL/min (Casy Brunetto) (by C-G formula based on SCr of 1.54 mg/dL (H)).  Liver Function Tests: Recent Labs  Lab 05/27/20 1357 05/28/20 0419  AST 26 24  ALT 25 23  ALKPHOS 95 78  BILITOT 0.4 0.4  PROT 8.1 7.0  ALBUMIN 4.2 3.6    CBG: Recent Labs  Lab 05/31/20 1752 05/31/20 2101 06/01/20 0635 06/01/20 1142 06/01/20 1626  GLUCAP 187* 92 143* 66* 101*     Recent Results (from the past 240 hour(s))  SARS  Coronavirus 2 by RT PCR (hospital order, performed in [facility] hospital lab) Nasopharyngeal Nasopharyngeal Swab     Status: None   Collection Time: 05/27/20  7:31 PM   Specimen: Nasopharyngeal Swab  Result Value Ref Range Status   SARS Coronavirus 2 NEGATIVE NEGATIVE Final    Comment: (NOTE) SARS-CoV-2 target nucleic acids are NOT DETECTED.  The SARS-CoV-2 RNA is generally detectable in upper and lower respiratory specimens during the acute phase of infection. The lowest concentration of SARS-CoV-2 viral copies this assay can detect is 250 copies / mL. Fate Caster negative result does not preclude SARS-CoV-2 infection and should not be used as the sole basis for treatment or other patient management decisions.  Gerard Bonus negative result may occur with improper specimen collection / handling, submission of specimen other than nasopharyngeal swab, presence of viral mutation(s) within the areas targeted by this assay, and inadequate number of viral copies (<250 copies / mL). Jodeci Rini negative result must be combined with clinical observations, patient history, and epidemiological information.  Fact Sheet for Patients:   StrictlyIdeas.no  Fact Sheet for Healthcare Providers: BankingDealers.co.za  This test is not yet approved or  cleared by the [place] FDA and has been authorized for detection and/or diagnosis of SARS-CoV-2 by FDA under an Emergency Use Authorization (EUA).  This EUA will remain in effect (meaning this test can be used) for the duration of the COVID-19 declaration under Section 564(b)(1) of the Act, 21 U.S.C. section 360bbb-3(b)(1), unless the authorization is terminated or revoked sooner.  Performed at [facility], [address] [place], [place] [address]   Culture, Urine     Status: Abnormal   Collection Time: 05/28/20 12:29 PM   Specimen: Urine, Clean Catch  Result Value Ref Range Status   Specimen Description   Final    URINE,  CLEAN CATCH Performed at [facility], [address], [place],  [address]  Special Requests   Final    NONE Performed at [facility], [address], [place], [place] [address]    Culture >=100,000 COLONIES/mL ESCHERICHIA COLI (Kaegan Hettich)  Final   Report Status 05/31/2020 FINAL  Final   Organism ID, Bacteria ESCHERICHIA COLI (Josette Shimabukuro)  Final      Susceptibility   Escherichia coli - MIC*    AMPICILLIN >=32 RESISTANT Resistant     CEFAZOLIN <=4 SENSITIVE Sensitive     CEFTRIAXONE <=0.25 SENSITIVE Sensitive     CIPROFLOXACIN <=0.25 SENSITIVE Sensitive     GENTAMICIN >=16 RESISTANT Resistant     IMIPENEM <=0.25 SENSITIVE Sensitive     NITROFURANTOIN <=16 SENSITIVE Sensitive     TRIMETH/SULFA >=320 RESISTANT Resistant     AMPICILLIN/SULBACTAM 16 INTERMEDIATE Intermediate     PIP/TAZO <=4 SENSITIVE Sensitive     * >=100,000 COLONIES/mL ESCHERICHIA COLI  Culture, blood (routine x 2)     Status: None (Preliminary result)   Collection Time: 05/29/20  1:50 PM   Specimen: BLOOD  Result Value Ref Range Status   Specimen Description BLOOD RIGHT ANTECUBITAL  Final   Special Requests   Final    BOTTLES DRAWN AEROBIC AND ANAEROBIC Blood Culture adequate volume   Culture   Final    NO GROWTH 3 DAYS Performed at [facility], [address], [place], [place] [address]    Report Status PENDING  Incomplete  Culture, blood (routine x 2)     Status: None (Preliminary result)   Collection Time: 05/29/20  1:51 PM   Specimen: BLOOD RIGHT HAND  Result Value Ref Range Status   Specimen Description BLOOD RIGHT HAND  Final   Special Requests   Final    BOTTLES DRAWN AEROBIC AND ANAEROBIC Blood Culture adequate volume   Culture   Final    NO GROWTH 3 DAYS Performed at [facility], [place] [address] [place], [place] [address]    Report Status PENDING  Incomplete         Radiology Studies: No results found.      Scheduled Meds: . amLODipine  5 mg Oral Daily  . aspirin EC  81 mg  Oral Daily  . atorvastatin  40 mg Oral Daily  . cephALEXin  500 mg Oral Q8H  . heparin  5,000 Units Subcutaneous Q8H  . insulin aspart  0-6 Units Subcutaneous TID WC  . insulin aspart protamine- aspart  20 Units Subcutaneous BID WC  . pantoprazole  40 mg Oral Daily   Continuous Infusions:   LOS: 5 days    Time spent: over 30 min    Fayrene Helper, MD [facility]   To contact the attending provider between 7A-7P or the covering provider during after hours 7P-7A, please log into the web site [email or web address] and access using universal [place] password for that web site. If you do not have the password, please call the hospital operator.  06/01/2020, 9:29 PM

## 2020-06-02 LAB — CBC WITH DIFFERENTIAL/PLATELET
Abs Immature Granulocytes: 0.01 10*3/uL (ref 0.00–0.07)
Basophils Absolute: 0 10*3/uL (ref 0.0–0.1)
Basophils Relative: 1 %
Eosinophils Absolute: 0.4 10*3/uL (ref 0.0–0.5)
Eosinophils Relative: 5 %
HCT: 35 % — ABNORMAL LOW (ref 36.0–46.0)
Hemoglobin: 10.2 g/dL — ABNORMAL LOW (ref 12.0–15.0)
Immature Granulocytes: 0 %
Lymphocytes Relative: 31 %
Lymphs Abs: 2.1 10*3/uL (ref 0.7–4.0)
MCH: 20.4 pg — ABNORMAL LOW (ref 26.0–34.0)
MCHC: 29.1 g/dL — ABNORMAL LOW (ref 30.0–36.0)
MCV: 70.1 fL — ABNORMAL LOW (ref 80.0–100.0)
Monocytes Absolute: 0.6 10*3/uL (ref 0.1–1.0)
Monocytes Relative: 9 %
Neutro Abs: 3.6 10*3/uL (ref 1.7–7.7)
Neutrophils Relative %: 54 %
Platelets: 168 10*3/uL (ref 150–400)
RBC: 4.99 MIL/uL (ref 3.87–5.11)
RDW: 15 % (ref 11.5–15.5)
WBC: 6.6 10*3/uL (ref 4.0–10.5)
nRBC: 0 % (ref 0.0–0.2)

## 2020-06-02 LAB — GLUCOSE, CAPILLARY
Glucose-Capillary: 228 mg/dL — ABNORMAL HIGH (ref 70–99)
Glucose-Capillary: 244 mg/dL — ABNORMAL HIGH (ref 70–99)
Glucose-Capillary: 205 mg/dL — ABNORMAL HIGH (ref 70–99)
Glucose-Capillary: 169 mg/dL — ABNORMAL HIGH (ref 70–99)

## 2020-06-02 LAB — BASIC METABOLIC PANEL
Anion gap: 12 (ref 5–15)
BUN: 19 mg/dL (ref 8–23)
CO2: 24 mmol/L (ref 22–32)
Calcium: 9.5 mg/dL (ref 8.9–10.3)
Chloride: 101 mmol/L (ref 98–111)
Creatinine, Ser: 1.17 mg/dL — ABNORMAL HIGH (ref 0.44–1.00)
GFR calc Af Amer: 52 mL/min — ABNORMAL LOW (ref >60)
GFR calc non Af Amer: 45 mL/min — ABNORMAL LOW (ref >60)
Glucose, Bld: 213 mg/dL — ABNORMAL HIGH (ref 70–99)
Potassium: 3.8 mmol/L (ref 3.5–5.1)
Sodium: 137 mmol/L (ref 135–145)

## 2020-06-02 MED ORDER — INSULIN ASPART PROT & ASPART (70-30 MIX) 100 UNIT/ML ~~LOC~~ SUSP
13.0000 [IU] | Freq: Two times a day (BID) | SUBCUTANEOUS | Status: DC
  Administered 2020-06-03 – 2020-06-04 (×3): 13 [IU] via SUBCUTANEOUS
  Filled 2020-06-02: qty 10

## 2020-06-02 NOTE — Progress Notes (Signed)
At this time, patient has no IV meds ordered. RN Tammy made aware to notify IV team if patient needs PIV in the future.

## 2020-06-02 NOTE — Progress Notes (Signed)
PROGRESS NOTE    Laura Atkins  RSW:[id number] DOB: 26-Nov-1940 DOA: 05/27/2020 PCP: Fayrene Helper, MD   Chief Complaint  Patient presents with  . Seizures    Brief Narrative:  79 year old female with history of alpha thalassemia, diabetes mellitus, hypertension, hyperlipidemia, was brought to the ED by the daughter due to concerns of patient having [name] seizure.  Daughter reports an episode of about [name] minute longer generalized tonic-clonic seizures, eye rolling back and remained confused 5 minutes after that.  Daughter reports that her sugar was around mid 300s around that time, she carries [name] log with the patient CBGs and her sugars usually are in the mid to upper 200s.  Daughter also mentions that over the last 3 weeks patient has not been herself, she has been confused, had poor appetite and sleeping most of the day, unable to walk without significant assistance.  Prior to 3 weeks ago patient has never had any memory problems, alert and oriented x4, ambulating with [name] walker, however daughter reports an episode of sundowning with her prior hospital stay.  She has been complaining of intermittent headaches but that is not unusual for her.  Daughter denies any prior history of seizures.  She is not drinking EtOH, she is not Jyair Kiraly smoker.  In the ED she was found to have acute kidney injury  Assessment & Plan:   Active Problems:   Alpha thalassemia (HCC)   Essential hypertension   GERD   Altered mental status   OSA (obstructive sleep apnea)   Type 2 diabetes mellitus with hyperglycemia (HCC)   Hyperlipidemia associated with type 2 diabetes mellitus ([place])   AKI (acute kidney injury) ([place])   Infection  Acute metabolic encephalopathy ??Concern for seizure Vs UTI Possibly underlying undiagnosed dementia No witnessed seizures since admission History of provoked seizure in 2013 2/2 Xanax withdrawal, was placed on Keppra and has been off for the last 6 years CT head showed no acute or  reversible finding MRI brain showed no acute intracranial abnormality EEG showed no seizure or epileptiform discharges UDS negative Neurology consulted, no need for AED unless unprovoked seizures, outpatient follow-up for possibly underlying dementia in 2 to 3 months Continue seizure precautions  AKI on chronic kidney disease stage IIIa Baseline creatinine 0.9-1.3, elevated at 2.7 on admission S/p gentle hydration Daily BMP  E. coli UTI UA positive for infection UC growing > 100,000 E. coli BC X 2 NGTD S/p IV ceftriaxone--> switch to p.o. Keflex to complete 5 days  Hypertension BP stable Continue Norvasc, hold benazepril due to AKI  Diabetes mellitus type 2, uncontrolled A1c 9.5, uncontrolled Noted to be hypoglycemic on 05/30/2020, adjusted SSI SSI, 70/30, accuchecks, hypoglycemic protocol adjust prn  Microcytic anemia Hemoglobin at baseline Anemia panel folate 10.8, Vit B12 458 Daily CBC  Hyperlipidemia Continue statin  Morbid obesity Lifestyle modification advised  DVT prophylaxis: heparin Code Status: full  Family Communication: none at bedside Disposition:   Status is: Inpatient  Remains inpatient appropriate because:Inpatient level of care appropriate due to severity of illness   Dispo: The patient is from: Home              Anticipated d/c is to: SNF              Anticipated d/c date is: 2 days              Patient currently is not medically stable to d/c.       Consultants:   neurology  Procedures:  EEG  ABNORMALITY -Continuous slow, generalized  IMPRESSION: This study is suggestive of mild diffuse encephalopathy, nonspecific etiology. No seizures or epileptiform discharges were seen throughout the recording.  Echo IMPRESSIONS    1. Left ventricular ejection fraction, by estimation, is 60 to 65%. The  left ventricle has normal function. The left ventricle has no regional  wall motion abnormalities. There is mild concentric  left ventricular  hypertrophy. Left ventricular diastolic  parameters are indeterminate.  2. Right ventricular systolic function is normal. The right ventricular  size is normal. There is normal pulmonary artery systolic pressure.  3. Left atrial size was mildly dilated.  4. Right atrial size was mildly dilated.  5. The mitral valve is normal in structure. Trivial mitral valve  regurgitation. No evidence of mitral stenosis.  6. The aortic valve is tricuspid. Aortic valve regurgitation is not  visualized. No aortic stenosis is present.  7. The inferior vena cava is normal in size with greater than 50%  respiratory variability, suggesting right atrial pressure of 3 mmHg.    Antimicrobials: Anti-infectives (From admission, onward)   Start     Dose/Rate Route Frequency Ordered Stop   05/31/20 1400  cephALEXin (KEFLEX) capsule 500 mg        500 mg Oral Every 8 hours 05/31/20 1058 06/02/20 0559   05/28/20 1600  cefTRIAXone (ROCEPHIN) 1 g in sodium chloride 0.9 % 100 mL IVPB  Status:  Discontinued        1 g 200 mL/hr over 30 Minutes Intravenous Every 24 hours 05/28/20 1125 05/31/20 1058   05/28/20 0800  meropenem (MERREM) 1 g in sodium chloride 0.9 % 100 mL IVPB        1 g 200 mL/hr over 30 Minutes Intravenous  Once 05/28/20 0747 05/28/20 0950   05/28/20 0730  ciprofloxacin (CIPRO) IVPB 400 mg  Status:  Discontinued        400 mg 200 mL/hr over 60 Minutes Intravenous Every 12 hours 05/28/20 0727 05/28/20 0727      Subjective: [name]&Ox2 No complaints  Objective: Vitals:   06/02/20 1200 06/02/20 1235 06/02/20 1427 06/02/20 1637  BP: (!) 146/60 (!) 146/60 120/89 (!) 148/61  Pulse: 74  81 81  Resp: 13  13 16   Temp: 98.6 F (37 C)  97.7 F (36.5 C) 98.5 F (36.9 C)  TempSrc: Axillary  Axillary Oral  SpO2: 98%  99% 100%  Weight:      Height:        Intake/Output Summary (Last 24 hours) at 06/02/2020 1824 Last data filed at 06/02/2020 1400 Gross per 24 hour  Intake 850 ml   Output 250 ml  Net 600 ml   Filed Weights   05/27/20 1301  Weight: 122.5 kg    Examination:  General: No acute distress. Cardiovascular: Heart sounds show [name] regular rate, and rhythm.  Lungs: Clear to auscultation bilaterally  Abdomen: Soft, nontender, nondistended Neurological: Alert and oriented 2. Moves all extremities 4. Cranial nerves II through XII grossly intact. Skin: Warm and dry. No rashes or lesions. Extremities: No clubbing or cyanosis. No edema.   Data Reviewed: I have personally reviewed following labs and imaging studies  CBC: Recent Labs  Lab 05/29/20 1059 05/30/20 0400 05/31/20 0743 06/01/20 0321 06/02/20 0505  WBC 4.9 4.7 5.5 6.8 6.6  NEUTROABS 2.5 2.1 3.1 3.8 3.6  HGB 10.2* 10.0* 9.9* 9.4* 10.2*  HCT 35.7* 34.2* 34.8* 33.4* 35.0*  MCV 70.6* 70.2* 70.4* 70.0* 70.1*  PLT 184 179 168 169  053    Basic Metabolic Panel: Recent Labs  Lab 05/27/20 1357 05/28/20 0419 05/29/20 1059 05/30/20 0400 05/31/20 0743 06/01/20 0321 06/02/20 0505  NA 136   < > 142 143 142 139 137  K 4.8   < > 4.3 4.0 3.9 3.7 3.8  CL 97*   < > 104 108 105 105 101  CO2 27   < > 27 27 23 24 24   GLUCOSE 264*   < > 223* 116* 119* 123* 213*  BUN 64*   < > 22 17 16 22 19   CREATININE 2.71*   < > 1.51* 1.19* 1.32* 1.54* 1.17*  CALCIUM 9.6   < > 9.6 9.6 9.6 9.6 9.5  MG 2.4  --   --   --   --   --   --    < > = values in this interval not displayed.    GFR: Estimated Creatinine Clearance: 56.4 mL/min (Rashia Mckesson) (by C-G formula based on SCr of 1.17 mg/dL (H)).  Liver Function Tests: Recent Labs  Lab 05/27/20 1357 05/28/20 0419  AST 26 24  ALT 25 23  ALKPHOS 95 78  BILITOT 0.4 0.4  PROT 8.1 7.0  ALBUMIN 4.2 3.6    CBG: Recent Labs  Lab 06/01/20 1626 06/01/20 2136 06/02/20 0612 06/02/20 1216 06/02/20 1636  GLUCAP 101* 70 228* 244* 205*     Recent Results (from the past 240 hour(s))  SARS Coronavirus 2 by RT PCR (hospital order, performed in [facility] hospital lab)  Nasopharyngeal Nasopharyngeal Swab     Status: None   Collection Time: 05/27/20  7:31 PM   Specimen: Nasopharyngeal Swab  Result Value Ref Range Status   SARS Coronavirus 2 NEGATIVE NEGATIVE Final    Comment: (NOTE) SARS-CoV-2 target nucleic acids are NOT DETECTED.  The SARS-CoV-2 RNA is generally detectable in upper and lower respiratory specimens during the acute phase of infection. The lowest concentration of SARS-CoV-2 viral copies this assay can detect is 250 copies / mL. Merrissa Giacobbe negative result does not preclude SARS-CoV-2 infection and should not be used as the sole basis for treatment or other patient management decisions.  Krisha Beegle negative result may occur with improper specimen collection / handling, submission of specimen other than nasopharyngeal swab, presence of viral mutation(s) within the areas targeted by this assay, and inadequate number of viral copies (<250 copies / mL). Emilio Baylock negative result must be combined with clinical observations, patient history, and epidemiological information.  Fact Sheet for Patients:   StrictlyIdeas.no  Fact Sheet for Healthcare Providers: BankingDealers.co.za  This test is not yet approved or  cleared by the [place] FDA and has been authorized for detection and/or diagnosis of SARS-CoV-2 by FDA under an Emergency Use Authorization (EUA).  This EUA will remain in effect (meaning this test can be used) for the duration of the COVID-19 declaration under Section 564(b)(1) of the Act, 21 U.S.C. section 360bbb-3(b)(1), unless the authorization is terminated or revoked sooner.  Performed at [facility], [address] [place], [place] [address]   Culture, Urine     Status: Abnormal   Collection Time: 05/28/20 12:29 PM   Specimen: Urine, Clean Catch  Result Value Ref Range Status   Specimen Description   Final    URINE, CLEAN CATCH Performed at [facility], [address] [place], [place]  [address]    Special Requests   Final    NONE Performed at [facility], [address], [place],  [address]  Culture >=100,000 COLONIES/mL ESCHERICHIA COLI (Nathasha Fiorillo)  Final   Report Status 05/31/2020 FINAL  Final   Organism ID, Bacteria ESCHERICHIA COLI (Glanda Spanbauer)  Final      Susceptibility   Escherichia coli - MIC*    AMPICILLIN >=32 RESISTANT Resistant     CEFAZOLIN <=4 SENSITIVE Sensitive     CEFTRIAXONE <=0.25 SENSITIVE Sensitive     CIPROFLOXACIN <=0.25 SENSITIVE Sensitive     GENTAMICIN >=16 RESISTANT Resistant     IMIPENEM <=0.25 SENSITIVE Sensitive     NITROFURANTOIN <=16 SENSITIVE Sensitive     TRIMETH/SULFA >=320 RESISTANT Resistant     AMPICILLIN/SULBACTAM 16 INTERMEDIATE Intermediate     PIP/TAZO <=4 SENSITIVE Sensitive     * >=100,000 COLONIES/mL ESCHERICHIA COLI  Culture, blood (routine x 2)     Status: None (Preliminary result)   Collection Time: 05/29/20  1:50 PM   Specimen: BLOOD  Result Value Ref Range Status   Specimen Description BLOOD RIGHT ANTECUBITAL  Final   Special Requests   Final    BOTTLES DRAWN AEROBIC AND ANAEROBIC Blood Culture adequate volume   Culture   Final    NO GROWTH 4 DAYS Performed at [facility], [address], [place], [place] [address]    Report Status PENDING  Incomplete  Culture, blood (routine x 2)     Status: None (Preliminary result)   Collection Time: 05/29/20  1:51 PM   Specimen: BLOOD RIGHT HAND  Result Value Ref Range Status   Specimen Description BLOOD RIGHT HAND  Final   Special Requests   Final    BOTTLES DRAWN AEROBIC AND ANAEROBIC Blood Culture adequate volume   Culture   Final    NO GROWTH 4 DAYS Performed at [facility], [place] [address] [place], [place] [address]    Report Status PENDING  Incomplete         Radiology Studies: No results found.      Scheduled Meds: . amLODipine  5 mg Oral Daily  . aspirin EC  81 mg Oral Daily  . atorvastatin  40 mg Oral Daily  . heparin  5,000 Units  Subcutaneous Q8H  . insulin aspart  0-6 Units Subcutaneous TID WC  . insulin aspart protamine- aspart  10 Units Subcutaneous BID WC  . pantoprazole  40 mg Oral Daily   Continuous Infusions:   LOS: 6 days    Time spent: over 30 min    Fayrene Helper, MD [facility]   To contact the attending provider between 7A-7P or the covering provider during after hours 7P-7A, please log into the web site [email or web address] and access using universal [place] password for that web site. If you do not have the password, please call the hospital operator.  06/02/2020, 6:24 PM

## 2020-06-02 NOTE — Progress Notes (Signed)
Inpatient Diabetes Program Recommendations  AACE/ADA: New Consensus Statement on Inpatient Glycemic Control (2015)  Target Ranges:  Prepandial:   less than 140 mg/dL      Peak postprandial:   less than 180 mg/dL (1-2 hours)      Critically ill patients:  140 - 180 mg/dL   Lab Results  Component Value Date   GLUCAP 228 (H) 06/02/2020   HGBA1C 9.5 (H) 05/27/2020    Review of Glycemic Control Results for Laura Atkins, Laura Atkins (MRN [id number]) as of 06/02/2020 10:25  Ref. Range 06/01/2020 11:42 06/01/2020 16:26 06/01/2020 21:36 06/02/2020 06:12  Glucose-Capillary Latest Ref Range: 70 - 99 mg/dL 66 (L) 101 (H) 70 228 (H)   Diabetes history:  DM2  Outpatient Diabetes medications:  70/30 30 units bid  Current orders for Inpatient glycemic control:  70/30 10 units bid Novolog 0-6 tid  Inpatient Diabetes Program Recommendations:    Patient had a mild low of 66mg /dl yesterday.  70/30 decreased to 10 units bid today.  Will likely need more.  Please consider 70/30 15 units bid.  Will continue to follow while inpatient.  Thank you, Reche Dixon, RN, BSN Diabetes Coordinator Inpatient Diabetes Program [phone] (team pager from 8a-5p)

## 2020-06-02 NOTE — Progress Notes (Signed)
Physical Therapy Treatment Patient Details Name: Laura Atkins MRN: [id number] DOB: June 12, 1941 Today's Date: 06/02/2020    History of Present Illness 79 y.o. female, history of alpha thalassemia, anxiety, diabetes mellitus, GERD, hyperlipidemia, hypertension, seizures(mentioned in past medical history but no further details), and was brought to ED 05/27/20 by her daughter secondary to concerns of seizure. AMS; Daughter stated that over the last 3 weeks patient has not been herself, she has been confused, had poor appetite and sleeping most of the day, unable to walk without significant assistance. Prior to that a&ox4 walking with walker. +AKI, encephalopathy, UTI, has been pulling out IVs, catheter    PT Comments    Patient received in bed, lunch mostly not touched in front of her. She is agreeable to PT session. Reports she has to go to the bathroom. Patient performed bed mobility with mod independence. Perseverating on how bad her ankles look. ( although they are not swollen and look fine to me). Patient transfers sit to stand from elevated bed with min assist. Ambulated with RW to bathroom, approx. 15 feet. Required mod assist to safely get on low commode and back off. Ambulated another 15 feet to recliner min guard to supervision. Slow pace. Requires re-direction at times as she tens to get off task and forgets what we are doing. She will continue to benefit from skilled PT while here to improve safety, strength and functional independence.         Follow Up Recommendations  SNF;Supervision/Assistance - 24 hour     Equipment Recommendations  None recommended by PT;Other (comment) (TBD)    Recommendations for Other Services       Precautions / Restrictions Precautions Precautions: Fall Precaution Comments: AMS Restrictions Weight Bearing Restrictions: No    Mobility  Bed Mobility Overal bed mobility: Needs Assistance Bed Mobility: Supine to Sit     Supine to sit: Min guard         Transfers Overall transfer level: Needs assistance Equipment used: Rolling walker (2 wheeled) Transfers: Sit to/from Stand Sit to Stand: Min assist;Mod assist         General transfer comment: min assist from elevated bed, mod assist from low toilet  Ambulation/Gait Ambulation/Gait assistance: Min guard Gait Distance (Feet): 30 Feet Assistive device: Rolling walker (2 wheeled) Gait Pattern/deviations: Step-through pattern;Decreased step length - right;Decreased step length - left;Decreased stride length;Trunk flexed Gait velocity: very slow   General Gait Details: Cues for upright posture and to stay close to rw. Needs redirection to continue with mobility at times, forgets what we are doing.   Stairs             Wheelchair Mobility    Modified Rankin (Stroke Patients Only)       Balance Overall balance assessment: Needs assistance Sitting-balance support: Feet supported Sitting balance-Leahy Scale: Good     Standing balance support: Bilateral upper extremity supported;During functional activity Standing balance-Leahy Scale: Fair Standing balance comment: relies on UE support                            Cognition Arousal/Alertness: Awake/alert Behavior During Therapy: Flat affect Overall Cognitive Status: Impaired/Different from baseline Area of Impairment: Orientation;Attention;Memory;Following commands;Safety/judgement;Awareness;Problem solving                 Orientation Level: Place;Time;Situation;Disoriented to Current Attention Level: Sustained Memory: Decreased recall of precautions;Decreased short-term memory Following Commands: Follows one step commands with increased time Safety/Judgement: Decreased awareness  of safety;Decreased awareness of deficits Awareness: Emergent Problem Solving: Slow processing;Decreased initiation;Requires verbal cues;Requires tactile cues General Comments: Patient requires frequent  re-direction. Forgets what we are doing.      Exercises      General Comments        Pertinent Vitals/Pain Pain Assessment: No/denies pain    Home Living                      Prior Function            PT Goals (current goals can now be found in the care plan section) Acute Rehab PT Goals Patient Stated Goal: pt unable; daughter wants pt to be able to walk safely PT Goal Formulation: With family Time For Goal Achievement: 06/12/20 Potential to Achieve Goals: Good Progress towards PT goals: Progressing toward goals    Frequency    Min 2X/week      PT Plan Current plan remains appropriate    Co-evaluation              AM-PAC PT "6 Clicks" Mobility   Outcome Measure  Help needed turning from your back to your side while in a flat bed without using bedrails?: A Little Help needed moving from lying on your back to sitting on the side of a flat bed without using bedrails?: A Little Help needed moving to and from a bed to a chair (including a wheelchair)?: A Little Help needed standing up from a chair using your arms (e.g., wheelchair or bedside chair)?: A Little Help needed to walk in hospital room?: A Little Help needed climbing 3-5 steps with a railing? : A Lot 6 Click Score: 17    End of Session Equipment Utilized During Treatment: Gait belt Activity Tolerance: Patient tolerated treatment well;Patient limited by fatigue Patient left: in chair;with call bell/phone within reach;with chair alarm set;with nursing/sitter in room Nurse Communication: Mobility status PT Visit Diagnosis: Other abnormalities of gait and mobility (R26.89);Muscle weakness (generalized) (M62.81);Difficulty in walking, not elsewhere classified (R26.2)     Time: 1350-1410 PT Time Calculation (min) (ACUTE ONLY): 20 min  Charges:  $Gait Training: 8-22 mins                     Osborne Serio, PT, GCS 06/02/20,3:07 PM

## 2020-06-03 DIAGNOSIS — R4189 Other symptoms and signs involving cognitive functions and awareness: Secondary | ICD-10-CM

## 2020-06-03 LAB — COMPREHENSIVE METABOLIC PANEL
ALT: 38 U/L (ref 0–44)
AST: 35 U/L (ref 15–41)
Albumin: 3.6 g/dL (ref 3.5–5.0)
Alkaline Phosphatase: 76 U/L (ref 38–126)
Anion gap: 11 (ref 5–15)
BUN: 19 mg/dL (ref 8–23)
CO2: 26 mmol/L (ref 22–32)
Calcium: 9.5 mg/dL (ref 8.9–10.3)
Chloride: 101 mmol/L (ref 98–111)
Creatinine, Ser: 1.13 mg/dL — ABNORMAL HIGH (ref 0.44–1.00)
GFR calc Af Amer: 55 mL/min — ABNORMAL LOW (ref >60)
GFR calc non Af Amer: 47 mL/min — ABNORMAL LOW (ref >60)
Glucose, Bld: 145 mg/dL — ABNORMAL HIGH (ref 70–99)
Potassium: 3.7 mmol/L (ref 3.5–5.1)
Sodium: 138 mmol/L (ref 135–145)
Total Bilirubin: 0.5 mg/dL (ref 0.3–1.2)
Total Protein: 7.1 g/dL (ref 6.5–8.1)

## 2020-06-03 LAB — CULTURE, BLOOD (ROUTINE X 2)
Culture: NO GROWTH
Culture: NO GROWTH
Special Requests: ADEQUATE
Special Requests: ADEQUATE

## 2020-06-03 LAB — CBC WITH DIFFERENTIAL/PLATELET
Abs Immature Granulocytes: 0.01 10*3/uL (ref 0.00–0.07)
Basophils Absolute: 0 10*3/uL (ref 0.0–0.1)
Basophils Relative: 1 %
Eosinophils Absolute: 0.3 10*3/uL (ref 0.0–0.5)
Eosinophils Relative: 5 %
HCT: 33.7 % — ABNORMAL LOW (ref 36.0–46.0)
Hemoglobin: 9.6 g/dL — ABNORMAL LOW (ref 12.0–15.0)
Immature Granulocytes: 0 %
Lymphocytes Relative: 37 %
Lymphs Abs: 2.4 10*3/uL (ref 0.7–4.0)
MCH: 19.8 pg — ABNORMAL LOW (ref 26.0–34.0)
MCHC: 28.5 g/dL — ABNORMAL LOW (ref 30.0–36.0)
MCV: 69.3 fL — ABNORMAL LOW (ref 80.0–100.0)
Monocytes Absolute: 0.6 10*3/uL (ref 0.1–1.0)
Monocytes Relative: 10 %
Neutro Abs: 3 10*3/uL (ref 1.7–7.7)
Neutrophils Relative %: 47 %
Platelets: 170 10*3/uL (ref 150–400)
RBC: 4.86 MIL/uL (ref 3.87–5.11)
RDW: 15 % (ref 11.5–15.5)
WBC: 6.3 10*3/uL (ref 4.0–10.5)
nRBC: 0 % (ref 0.0–0.2)

## 2020-06-03 LAB — GLUCOSE, CAPILLARY
Glucose-Capillary: 146 mg/dL — ABNORMAL HIGH (ref 70–99)
Glucose-Capillary: 212 mg/dL — ABNORMAL HIGH (ref 70–99)
Glucose-Capillary: 214 mg/dL — ABNORMAL HIGH (ref 70–99)
Glucose-Capillary: 157 mg/dL — ABNORMAL HIGH (ref 70–99)

## 2020-06-03 LAB — PHOSPHORUS: Phosphorus: 3.4 mg/dL (ref 2.5–4.6)

## 2020-06-03 LAB — MAGNESIUM: Magnesium: 2 mg/dL (ref 1.7–2.4)

## 2020-06-03 MED ORDER — NOVOLIN 70/30 RELION (70-30) 100 UNIT/ML ~~LOC~~ SUSP: 13.0000 [IU] | Freq: Two times a day (BID) | SUBCUTANEOUS | 11 refills | Status: DC

## 2020-06-03 NOTE — TOC Transition Note (Addendum)
Transition of Care [facility]) - CM/SW Discharge Note   Patient Details  Name: Laura Atkins MRN: [id number] Date of Birth: 07-Jan-1941  [facility]) CM/SW Contact:  Geralynn Ochs, LCSW Phone Number: 06/03/2020, 1:44 PM   Clinical Narrative:   Nurse to call report to [phone], Room 103    Final next level of care: Skilled Nursing Facility Barriers to Discharge: Barriers Resolved   Patient Goals and CMS Choice Patient states their goals for this hospitalization and ongoing recovery are:: patient unable to participate in goal setting due to disorientation CMS Medicare.gov Compare Post Acute Care list provided to:: Patient Represenative (must comment) Choice offered to / list presented to : Adult Children  Discharge Placement              Patient chooses bed at: [facility] Patient to be transferred to facility by: [place] Name of family member notified: [name] Patient and family notified of of transfer: 06/03/20  Discharge Plan and Services     Post Acute Care Choice: [place]                               Social Determinants of Health (SDOH) Interventions     Readmission Risk Interventions No flowsheet data found.

## 2020-06-03 NOTE — Progress Notes (Addendum)
Occupational Therapy Treatment Patient Details Name: CAMILLE DRAGAN MRN: [id number] DOB: Feb 15, 1941 Today's Date: 06/03/2020    History of present illness 79 y.o. female, history of alpha thalassemia, anxiety, diabetes mellitus, GERD, hyperlipidemia, hypertension, seizures(mentioned in past medical history but no further details), and was brought to ED 05/27/20 by her daughter secondary to concerns of seizure. AMS; Daughter stated that over the last 3 weeks patient has not been herself, she has been confused, had poor appetite and sleeping most of the day, unable to walk without significant assistance. Prior to that a&ox4 walking with walker. +AKI, encephalopathy, UTI, has been pulling out IVs, catheter   OT comments  Pt. Seen for skilled OT with focus on following commands to facilitate with emergent awareness with self feeding task at lunch.  Pt. Able to follow one step commands only, unable to achieve multi step at this time.  Unable to initiate task without a directive.   Easily distracted by herself and environmental stimulus. Will cont. With current POC with continued integration of ADLS to aide in increasing pts. Independence and safety.     Follow Up Recommendations  SNF;Supervision/Assistance - 24 hour    Equipment Recommendations  3 in 1 bedside commode    Recommendations for Other Services      Precautions / Restrictions Precautions Precautions: Fall Precaution Comments: AMS       Mobility Bed Mobility                  Transfers                      Balance                                           ADL either performed or assessed with clinical judgement   ADL Overall ADL's : Needs assistance/impaired Eating/Feeding: Minimal assistance Eating/Feeding Details (indicate cue type and reason): cont. to ask "okay what next" one step commands required for each phase of eating.                                   General  ADL Comments: patient limited by impaired cognition, balance, weakness -cont. work on emergent awareness and ability to follow commands with utilization of self feeding lunch.  pt. unable to initiate any portion of self feeding with out asking "ok what next" i would say you can take of bite of...and list 2 choices pt. would state "i dont know".  required specific directions ie: take a bite of the mac and cheese.  and would immeditely ask for next directive for completion.     Vision       Perception     Praxis      Cognition Arousal/Alertness: Awake/alert Behavior During Therapy: Flat affect Overall Cognitive Status: Impaired/Different from baseline Area of Impairment: Orientation;Attention;Memory;Following commands;Safety/judgement;Awareness;Problem solving                 Orientation Level: Place;Time;Situation;Disoriented to Current Attention Level: Sustained Memory: Decreased recall of precautions;Decreased short-term memory Following Commands: Follows one step commands with increased time Safety/Judgement: Decreased awareness of safety;Decreased awareness of deficits Awareness: Emergent Problem Solving: Slow processing;Decreased initiation;Requires verbal cues;Requires tactile cues General Comments: Patient requires frequent re-direction. Forgets what we are doing.        Exercises  Shoulder Instructions       General Comments      Pertinent Vitals/ Pain       Pain Assessment: No/denies pain  Home Living                                          Prior Functioning/Environment              Frequency  Min 2X/week        Progress Toward Goals  OT Goals(current goals can now be found in the care plan section)  Progress towards OT goals: Progressing toward goals     Plan      Co-evaluation                 AM-PAC OT "6 Clicks" Daily Activity     Outcome Measure   Help from another person eating meals?: A Little Help  from another person taking care of personal grooming?: A Little Help from another person toileting, which includes using toliet, bedpan, or urinal?: Total Help from another person bathing (including washing, rinsing, drying)?: A Lot Help from another person to put on and taking off regular upper body clothing?: A Lot Help from another person to put on and taking off regular lower body clothing?: Total 6 Click Score: 12    End of Session    OT Visit Diagnosis: Other abnormalities of gait and mobility (R26.89);Muscle weakness (generalized) (M62.81);Other symptoms and signs involving cognitive function   Activity Tolerance Patient tolerated treatment well   Patient Left in chair;with call bell/phone within reach;with chair alarm set   Nurse Communication Other (comment) (reviewed with CNA and RN what pt. had eaten, portion wise)        Time: 0998-3382 OT Time Calculation (min): 14 min  Charges: OT General Charges $OT Visit: 1 Visit OT Treatments $Self Care/Home Management : 8-22 mins  Sonia Baller, [place]   Janice Coffin 06/03/2020, 1:44 PM

## 2020-06-03 NOTE — Progress Notes (Signed)
Pt was d/c waiting for [facility] to be transported to [facility] in [place], as at 2130, transport has not come, [facility] office called to clarify when they will be here, [facility] personnel said for some reason the pt's dispatch call was placed on hold earlier today hence nobody was dispatched to pick up pt. However, the [facility] said it is too late to accept pt anyway, this RN spoke with the Coordinator Jolene Schimke) at the [facility] who said they will accept pt in the morning. Pt's RN (Tutu) advised to call and inform pt's Doc on call and family about the current situation. Laura Atkins, Laura Atkins

## 2020-06-03 NOTE — Discharge Summary (Addendum)
Physician Discharge Summary  Laura Atkins JSE:[id number] DOB: April 04, 1941 DOA: 05/27/2020  PCP: Fayrene Helper, MD  Admit date: 05/27/2020 Discharge date: 06/04/2020  Time spent: 40 minutes  Recommendations for Outpatient Follow-up:  1. Follow outpatient CBC/CMP 2. Follow with neurology outpatient 3. Seizure precautions outpatient 4. 70/30 insulin adjusted to 13 units twice daily, follow ACHS blood sugars and adjust as needed 5. Benazepril d/c'd due to AKI, BP reasonable - follow BP and renal function outpatient to determine whether this needs to be resumed or can be safely resumed 6. Follow iron studies outpatient.  B12 wnl, folate wnl.  Discharge Diagnoses:  Active Problems:   Alpha thalassemia (HCC)   Essential hypertension   GERD   Altered mental status   Seizure-like activity (HCC)   OSA (obstructive sleep apnea)   Type 2 diabetes mellitus with hyperglycemia (HCC)   Hyperlipidemia associated with type 2 diabetes mellitus (HCC)   AKI (acute kidney injury) ([place])   Infection   Cognitive deficits   Discharge Condition: stable  Diet recommendation: diabetic diet  Filed Weights   05/27/20 1301  Weight: 122.5 kg    History of present illness:  79 year old female with history of alpha thalassemia, diabetes mellitus, hypertension, hyperlipidemia, was brought to the ED by the daughter due to concerns of patient having a seizure. Daughter reports an episode of about a minute longer generalized tonic-clonic seizures, eye rolling back and remained confused 5 minutes after that. Daughter reports that her sugar was around mid 300s around that time, she carries a log with the patient CBGs and her sugars usually are in the mid to upper 200s. Daughter also mentions that over the last 3 weeks patient has not been herself, she has been confused, had poor appetite and sleeping most of the day, unable to walk without significant assistance. Prior to 3 weeks ago patient has never had any  memory problems, alert and oriented x4, ambulating with a walker, however daughter reports an episode of sundowning with her prior hospital stay. She has been complaining of intermittent headaches but that is not unusual for her. Daughter denies any prior history of seizures. She is not drinking EtOH, she is not a smoker. In the ED she was found to have acute kidney injury  She was seen for seizures which were thought to be provoked in the setting of a UTI with AKI.  She had MRI without acute findings and EEG with mild diffuse encephalopathy.  She was seen by neurology who recommended no maintenance AED unless unprovoked seizures.  Recommended outpatient neurology follow up to eval for dementia.  See below for additional details  Hospital Course:  Acute metabolic encephalopathy Provoked seizure in the setting of UTI and AKI Possibly underlying undiagnosed dementia No witnessed seizures since admission History of provoked seizure in 2013 2/2 Xanax withdrawal, was placed on Keppra and has been off for the last 6 years CT head showed no acute or reversible finding MRI brain showed no acute intracranial abnormality EEG showed no seizure or epileptiform discharges UDS negative wellbutrin d/c'd at discharge Neurology consulted, no need for AED unless unprovoked seizures, outpatient follow-up for possibly underlying dementia in 2 to 3 months Continue seizure precautions   AKI on chronic kidney disease stage IIIa Baseline creatinine 0.9-1.3, elevated at 2.7 on admission S/p gentle hydration Daily BMP  E. coliUTI UA positive for infection UC growing> 100,000E. coli BC X 2 NGTD S/pIV ceftriaxone-->switch to p.o. Keflex for 5 total days therapy  Hypertension BP stable  Continue Norvasc, hold benazepril due to AKI - follow outpatient to determine if ok to resume  Diabetes mellitus type 2, uncontrolled A1c 9.5, uncontrolled Noted to be hypoglycemic on 05/30/2020,adjusted SSI SSI,  70/30, accuchecks, hypoglycemic protocol adjust prn -> decreased to 13 units 70/30 BID - follow BG's ACHS outpatient  Microcytic anemia Hemoglobin at baseline folate 10.8, Vit B12 458 Daily CBC Follow iron studies outpatient  Hyperlipidemia Continue statin  Morbid obesity Lifestyle modification advised   Procedures: Echo IMPRESSIONS    1. Left ventricular ejection fraction, by estimation, is 60 to 65%. The  left ventricle has normal function. The left ventricle has no regional  wall motion abnormalities. There is mild concentric left ventricular  hypertrophy. Left ventricular diastolic  parameters are indeterminate.  2. Right ventricular systolic function is normal. The right ventricular  size is normal. There is normal pulmonary artery systolic pressure.  3. Left atrial size was mildly dilated.  4. Right atrial size was mildly dilated.  5. The mitral valve is normal in structure. Trivial mitral valve  regurgitation. No evidence of mitral stenosis.  6. The aortic valve is tricuspid. Aortic valve regurgitation is not  visualized. No aortic stenosis is present.  7. The inferior vena cava is normal in size with greater than 50%  respiratory variability, suggesting right atrial pressure of 3 mmHg.   Comparison(s): No significant change from prior study.   Conclusion(s)/Recommendation(s): Otherwise normal echocardiogram, with  minor abnormalities described in the report.   Consultations:  neurology  Discharge Exam: Vitals:   06/04/20 0444 06/04/20 1217  BP: 135/65 129/70  Pulse: 76 81  Resp: 16 18  Temp: 98.8 F (37.1 C) 98.2 F (36.8 C)  SpO2: 99% 100%   A&Ox1 Pleasantly confused No complaints Called daughter, no answer, left message  General: No acute distress. Cardiovascular: Heart sounds show a regular rate, and rhythm Lungs: Clear to auscultation bilaterally Abdomen: Soft, nontender, nondistended  Neurological: Alert and oriented 1. Moves  all extremities 4. Cranial nerves II through XII grossly intact. Skin: Warm and dry. No rashes or lesions. Extremities: No clubbing or cyanosis. No edema  Discharge Instructions   Discharge Instructions    Ambulatory referral to Neurology   Complete by: As directed    An appointment is requested in approximately: 8 weeks   Call MD for:  difficulty breathing, headache or visual disturbances   Complete by: As directed    Call MD for:  extreme fatigue   Complete by: As directed    Call MD for:  hives   Complete by: As directed    Call MD for:  persistant dizziness or light-headedness   Complete by: As directed    Call MD for:  persistant nausea and vomiting   Complete by: As directed    Call MD for:  redness, tenderness, or signs of infection (pain, swelling, redness, odor or green/yellow discharge around incision site)   Complete by: As directed    Call MD for:  severe uncontrolled pain   Complete by: As directed    Call MD for:  temperature >100.4   Complete by: As directed    Diet - low sodium heart healthy   Complete by: As directed    Discharge instructions   Complete by: As directed    You were seen for seizures in the setting of acute kidney injury and a UTI.  You were seen by neurology who does not recommend any seizure medications, unless you have an unprovoked seizure.  You  should avoid activities that may be dangerous if you had a seizure (avoid swimming alone, ladders, driving, etc).  We've stopped your benazepril and your wellbutrin.  Wellbutrin can make you more predisposed to seizures.    Follow with your PCP to see if your benazepril can be resumed or should be resumed.   Return for new, recurrent, or worsening symptoms.  Please ask your PCP to request records from this hospitalization so they know what was done and what the next steps will be.   Increase activity slowly   Complete by: As directed      Allergies as of 06/04/2020      Reactions    Penicillins Shortness Of Breath, Itching, Rash   Prednisone Shortness Of Breath, Itching, Rash   Propoxyphene N-acetaminophen Itching, Nausea And Vomiting   Sulfa Antibiotics Itching, Nausea And Vomiting      Medication List    STOP taking these medications   benazepril 20 MG tablet Commonly known as: LOTENSIN   buPROPion 150 MG 24 hr tablet Commonly known as: WELLBUTRIN XL   Extra Strength Pain Relief 500 MG tablet Generic drug: acetaminophen   Macrobid 100 MG capsule Generic drug: nitrofurantoin (macrocrystal-monohydrate)   torsemide 20 MG tablet Commonly known as: DEMADEX     TAKE these medications   Accu-Chek FastClix Lancets Misc 1 each by Does not apply route 4 (four) times daily. Use to monitor glucose levels 4 times daily; E11.65   Accu-Chek Guide Me w/Device Kit 1 each by Does not apply route 4 (four) times daily. Use to monitor glucose levels 4 times daily; E11.65   Accu-Chek Guide test strip Generic drug: glucose blood 1 each by Other route 4 (four) times daily.   amLODipine 5 MG tablet Commonly known as: NORVASC Take 1 tablet (5 mg total) by mouth daily.   aspirin 81 MG tablet Take 81 mg by mouth daily.   atorvastatin 40 MG tablet Commonly known as: LIPITOR TAKE 1 TABLET BY MOUTH EVERY DAY   B-D SINGLE USE SWABS REGULAR Pads 1 each by Does not apply route 4 (four) times daily. Use to prep site for glucose monitoring 4 times daily; E11.65   Cholecalciferol 125 MCG (5000 UT) Tabs Take 1 tablet by mouth daily.   cyclobenzaprine 5 MG tablet Commonly known as: FLEXERIL Take one  Tablet by mouth at bedtime for 1 week, then as needed   diphenhydrAMINE 25 MG tablet Commonly known as: BENADRYL Take 25 mg by mouth at bedtime.   ferrous sulfate 325 (65 FE) MG tablet Take 1 tablet (325 mg total) by mouth daily with breakfast.   FreeStyle Libre 14 Day Sensor Misc 1 Device by Does not apply route every 14 (fourteen) days. E11.9; MUST KEEP APPT IN ORDER  TO PROCESS FUTURE REFILL REQUESTS   gabapentin 400 MG capsule Commonly known as: NEURONTIN TAKE ONE CAPSULE BY MOUTH EVERY MORNING and TAKE ONE CAPSULE AT NOON and TAKE TWO CAPSULES AT BEDTIME   INSULIN SYRINGE 1CC/31GX5/16" 31G X 5/16" 1 ML Misc 1 each by Does not apply route daily. Use to inject insulin daily; E11.65   multivitamin capsule Take 1 capsule by mouth daily.   NovoLIN 70/30 ReliOn (70-30) 100 UNIT/ML injection Generic drug: insulin NPH-regular Human Inject 13 Units into the skin 2 (two) times daily with a meal. What changed:   how much to take  when to take this   omeprazole 20 MG capsule Commonly known as: PRILOSEC TAKE ONE CAPSULE BY MOUTH EVERY DAY  potassium chloride SA 20 MEQ tablet Commonly known as: KLOR-CON TAKE 1 TABLET BY MOUTH TWICE DAILY   sertraline 100 MG tablet Commonly known as: ZOLOFT Take 1 tablet (100 mg total) by mouth daily.   UNABLE TO FIND Walker x 1  DX unsteady gait, osteoarthritis of left knee   UNABLE TO FIND Elevated [place] Seat x 1 DX: unsteady gait, back pain, osteoarthritis of left knee   UNABLE TO FIND Standing upright walker x 1  DX M54.40, M19.90      Allergies  Allergen Reactions  . Penicillins Shortness Of Breath, Itching and Rash  . Prednisone Shortness Of Breath, Itching and Rash  . Propoxyphene N-Acetaminophen Itching and Nausea And Vomiting  . Sulfa Antibiotics Itching and Nausea And Vomiting    Contact information for after-discharge care    [facility] .   Service: Skilled Nursing Contact information: [address] [place] [phone]                   The results of significant diagnostics from this hospitalization (including imaging, microbiology, ancillary and laboratory) are listed below for reference.    Significant Diagnostic Studies: DG Chest 1 View  Result Date: 05/27/2020 CLINICAL DATA:  Seizure EXAM: CHEST  1  VIEW COMPARISON:  05/10/2020 FINDINGS: Mild cardiomegaly, stable. Atherosclerotic calcification of the aortic knob. No focal airspace consolidation, pleural effusion, or pneumothorax. Degenerative changes of the shoulders, right worse than left. IMPRESSION: No active disease. Electronically Signed   By: Davina Poke D.O.   On: 05/27/2020 14:02   CT Head Wo Contrast  Result Date: 05/27/2020 CLINICAL DATA:  Seizure like activity.  Hyperglycemia. EXAM: CT HEAD WITHOUT CONTRAST TECHNIQUE: Contiguous axial images were obtained from the base of the skull through the vertex without intravenous contrast. COMPARISON:  05/10/2020 FINDINGS: Brain: Age related atrophy. Chronic small-vessel ischemic changes of the hemispheric white matter. No sign of acute infarction, mass lesion, hemorrhage, hydrocephalus or extra-axial collection. Vascular: There is atherosclerotic calcification of the major vessels at the base of the brain. Skull: Negative Sinuses/Orbits: Clear/normal Other: None IMPRESSION: No acute or reversible finding. Age related atrophy. Chronic small-vessel ischemic changes of the hemispheric white matter. Electronically Signed   By: Nelson Chimes M.D.   On: 05/27/2020 13:58   MR BRAIN WO CONTRAST  Result Date: 05/27/2020 CLINICAL DATA:  Mental status change EXAM: MRI HEAD WITHOUT CONTRAST TECHNIQUE: Multiplanar, multiecho pulse sequences of the brain and surrounding structures were obtained without intravenous contrast. COMPARISON:  2013 FINDINGS: Motion artifact is present. Brain: There is no acute infarction or intracranial hemorrhage. There is no intracranial mass, mass effect, or edema. There is no hydrocephalus or extra-axial fluid collection. Prominence of the ventricles and sulci reflects generalized parenchymal volume loss similar to the prior study. Patchy and confluent areas of T2 hyperintensity in the supratentorial white matter are nonspecific but probably reflect similar moderate chronic  microvascular ischemic changes. Vascular: Major vessel flow voids at the skull base are preserved. Skull and upper cervical spine: Normal marrow signal is preserved. Sinuses/Orbits: Paranasal sinuses are aerated. Bilateral lens replacements. Other: Sella is unremarkable.  Mastoid air cells are clear. IMPRESSION: No acute infarction, hemorrhage, or mass. Moderate chronic microvascular ischemic changes. Electronically Signed   By: Macy Mis M.D.   On: 05/27/2020 15:47   EEG adult  Result Date: 05/29/2020 Lora Havens, MD     05/29/2020  6:21 PM Patient Name: [name]  MRN: [id number] Epilepsy Attending: Lora Havens Referring Physician/Provider: DR Donnetta Simpers Date: 05/29/2020 Duration: 25.01 mins Patient history: 79yo F with h/o seizures and ams. EEG to evaluate for seizure. Level of alertness: Awake AEDs during EEG study: None Technical aspects: This EEG study was done with scalp electrodes positioned according to the 10-20 International system of electrode placement. Electrical activity was acquired at a sampling rate of _0  and reviewed with a high frequency filter of _1  and a low frequency filter of _2 . EEG data were recorded continuously and digitally stored. Description: The posterior dominant rhythm consists of 7.5-8 Hz activity of moderate voltage (25-35 uV) seen predominantly in posterior head regions, symmetric and reactive to eye opening and eye closing. EEG showed continuous generalized polymorphic 3 to 6 Hz theta-delta slowing. Hyperventilation and photic stimulation were not performed.   ABNORMALITY -Continuous slow, generalized IMPRESSION: This study is suggestive of mild diffuse encephalopathy, nonspecific etiology. No seizures or epileptiform discharges were seen throughout the recording. Lora Havens   ECHOCARDIOGRAM COMPLETE  Result Date: 05/28/2020    ECHOCARDIOGRAM REPORT   Patient Name:   [name] GIN Ficken Date of Exam: 05/28/2020 Medical Rec #:  [id number]        Height:       68.0 in Accession #:    [id number]      Weight:       270.0 lb Date of Birth:  1940-10-08       BSA:          2.322 m Patient Age:    79 years        BP:           118/66 mmHg Patient Gender: F               HR:           97 bpm. Exam Location:  [facility] Procedure: 2D Echo, Cardiac Doppler and Color Doppler Indications:    Syncope 780.2 / R55  History:        Patient has prior history of Echocardiogram examinations, most                 recent 02/17/2013. Risk Factors:Hypertension, Diabetes and                 Dyslipidemia. OSA (obstructive sleep apnea), Obesity, GERD.  Sonographer:    Alvino Chapel RCS Referring Phys: [place]  1. Left ventricular ejection fraction, by estimation, is 60 to 65%. The left ventricle has normal function. The left ventricle has no regional wall motion abnormalities. There is mild concentric left ventricular hypertrophy. Left ventricular diastolic parameters are indeterminate.  2. Right ventricular systolic function is normal. The right ventricular size is normal. There is normal pulmonary artery systolic pressure.  3. Left atrial size was mildly dilated.  4. Right atrial size was mildly dilated.  5. The mitral valve is normal in structure. Trivial mitral valve regurgitation. No evidence of mitral stenosis.  6. The aortic valve is tricuspid. Aortic valve regurgitation is not visualized. No aortic stenosis is present.  7. The inferior vena cava is normal in size with greater than 50% respiratory variability, suggesting right atrial pressure of 3 mmHg. Comparison(s): No significant change from prior study. Conclusion(s)/Recommendation(s): Otherwise normal echocardiogram, with minor abnormalities described in the report. FINDINGS  Left Ventricle: Left ventricular ejection fraction, by estimation, is 60 to 65%. The left ventricle has normal function. The left ventricle has no regional wall motion abnormalities.  The left ventricular internal cavity  size was normal in size. There is  mild concentric left ventricular hypertrophy. Left ventricular diastolic parameters are indeterminate. Right Ventricle: The right ventricular size is normal. No increase in right ventricular wall thickness. Right ventricular systolic function is normal. There is normal pulmonary artery systolic pressure. The tricuspid regurgitant velocity is 2.53 m/s, and  with an assumed right atrial pressure of 3 mmHg, the estimated right ventricular systolic pressure is 27.7 mmHg. Left Atrium: Left atrial size was mildly dilated. Right Atrium: Right atrial size was mildly dilated. Pericardium: The pericardium was not assessed. Mitral Valve: Moderate to severe focal mitral annular calcification posterolateral annulus. The mitral valve is normal in structure. Moderate to severe mitral annular calcification. Trivial mitral valve regurgitation. No evidence of mitral valve stenosis. Tricuspid Valve: The tricuspid valve is normal in structure. Tricuspid valve regurgitation is mild . No evidence of tricuspid stenosis. Aortic Valve: The aortic valve is tricuspid. Aortic valve regurgitation is not visualized. No aortic stenosis is present. Pulmonic Valve: The pulmonic valve was grossly normal. Pulmonic valve regurgitation is trivial. No evidence of pulmonic stenosis. Aorta: The aortic root, ascending aorta and aortic arch are all structurally normal, with no evidence of dilitation or obstruction. Venous: The inferior vena cava is normal in size with greater than 50% respiratory variability, suggesting right atrial pressure of 3 mmHg. IAS/Shunts: No atrial level shunt detected by color flow Doppler.  LEFT VENTRICLE PLAX 2D LVIDd:         4.02 cm  Diastology LVIDs:         2.59 cm  LV e' lateral:   9.57 cm/s LV PW:         1.16 cm  LV E/e' lateral: 10.1 LV IVS:        1.20 cm  LV e' medial:    6.85 cm/s LVOT diam:     1.90 cm  LV E/e' medial:  14.1 LV SV:         73 LV SV Index:   31 LVOT Area:     2.84  cm  RIGHT VENTRICLE RV S prime:     11.70 cm/s TAPSE (M-mode): 2.3 cm LEFT ATRIUM             Index       RIGHT ATRIUM           Index LA diam:        3.20 cm 1.38 cm/m  RA Area:     17.70 cm LA Vol (A2C):   74.8 ml 32.22 ml/m RA Volume:   50.30 ml  21.66 ml/m LA Vol (A4C):   53.8 ml 23.17 ml/m LA Biplane Vol: 66.0 ml 28.43 ml/m  AORTIC VALVE LVOT Vmax:   109.00 cm/s LVOT Vmean:  77.400 cm/s LVOT VTI:    0.257 m  AORTA Ao Root diam: 3.40 cm MITRAL VALVE                TRICUSPID VALVE MV Area (PHT): 2.91 cm     TR Peak grad:   25.6 mmHg MV Decel Time: 261 msec     TR Vmax:        253.00 cm/s MV E velocity: 96.80 cm/s MV A velocity: 124.00 cm/s  SHUNTS MV E/A ratio:  0.78         Systemic VTI:  0.26 m  Systemic Diam: 1.90 cm Buford Dresser MD Electronically signed by Buford Dresser MD Signature Date/Time: 05/28/2020/1:16:57 PM    Final     Microbiology: Recent Results (from the past 240 hour(s))  SARS Coronavirus 2 by RT PCR (hospital order, performed in [facility] hospital lab) Nasopharyngeal Nasopharyngeal Swab     Status: None   Collection Time: 05/27/20  7:31 PM   Specimen: Nasopharyngeal Swab  Result Value Ref Range Status   SARS Coronavirus 2 NEGATIVE NEGATIVE Final    Comment: (NOTE) SARS-CoV-2 target nucleic acids are NOT DETECTED.  The SARS-CoV-2 RNA is generally detectable in upper and lower respiratory specimens during the acute phase of infection. The lowest concentration of SARS-CoV-2 viral copies this assay can detect is 250 copies / mL. A negative result does not preclude SARS-CoV-2 infection and should not be used as the sole basis for treatment or other patient management decisions.  A negative result may occur with improper specimen collection / handling, submission of specimen other than nasopharyngeal swab, presence of viral mutation(s) within the areas targeted by this assay, and inadequate number of viral copies (<250 copies /  mL). A negative result must be combined with clinical observations, patient history, and epidemiological information.  Fact Sheet for Patients:   StrictlyIdeas.no  Fact Sheet for Healthcare Providers: BankingDealers.co.za  This test is not yet approved or  cleared by the [place] FDA and has been authorized for detection and/or diagnosis of SARS-CoV-2 by FDA under an Emergency Use Authorization (EUA).  This EUA will remain in effect (meaning this test can be used) for the duration of the COVID-19 declaration under Section 564(b)(1) of the Act, 21 U.S.C. section 360bbb-3(b)(1), unless the authorization is terminated or revoked sooner.  Performed at [facility], [address] [place], [place] [address]   Culture, Urine     Status: Abnormal   Collection Time: 05/28/20 12:29 PM   Specimen: Urine, Clean Catch  Result Value Ref Range Status   Specimen Description   Final    URINE, CLEAN CATCH Performed at [facility], [address], [place], [place] [address]    Special Requests   Final    NONE Performed at [facility], [address] [place], [place] [address]    Culture >=100,000 COLONIES/mL ESCHERICHIA COLI (A)  Final   Report Status 05/31/2020 FINAL  Final   Organism ID, Bacteria ESCHERICHIA COLI (A)  Final      Susceptibility   Escherichia coli - MIC*    AMPICILLIN >=32 RESISTANT Resistant     CEFAZOLIN <=4 SENSITIVE Sensitive     CEFTRIAXONE <=0.25 SENSITIVE Sensitive     CIPROFLOXACIN <=0.25 SENSITIVE Sensitive     GENTAMICIN >=16 RESISTANT Resistant     IMIPENEM <=0.25 SENSITIVE Sensitive     NITROFURANTOIN <=16 SENSITIVE Sensitive     TRIMETH/SULFA >=320 RESISTANT Resistant     AMPICILLIN/SULBACTAM 16 INTERMEDIATE Intermediate     PIP/TAZO <=4 SENSITIVE Sensitive     * >=100,000 COLONIES/mL ESCHERICHIA COLI  Culture, blood (routine x 2)     Status: None   Collection Time: 05/29/20  1:50 PM   Specimen: BLOOD   Result Value Ref Range Status   Specimen Description BLOOD RIGHT ANTECUBITAL  Final   Special Requests   Final    BOTTLES DRAWN AEROBIC AND ANAEROBIC Blood Culture adequate volume   Culture   Final    NO GROWTH 5 DAYS Performed at [facility], [address] [place], [place] [address]  Report Status 06/03/2020 FINAL  Final  Culture, blood (routine x 2)     Status: None   Collection Time: 05/29/20  1:51 PM   Specimen: BLOOD RIGHT HAND  Result Value Ref Range Status   Specimen Description BLOOD RIGHT HAND  Final   Special Requests   Final    BOTTLES DRAWN AEROBIC AND ANAEROBIC Blood Culture adequate volume   Culture   Final    NO GROWTH 5 DAYS Performed at [facility], [place] [address], [place], [place] [address]    Report Status 06/03/2020 FINAL  Final     Labs: Basic Metabolic Panel: Recent Labs  Lab 05/30/20 0400 05/31/20 0743 06/01/20 0321 06/02/20 0505 06/03/20 0210  NA 143 142 139 137 138  K 4.0 3.9 3.7 3.8 3.7  CL 108 105 105 101 101  CO2 _0 GLUCOSE 116* 119* 123* 213* 145*  BUN _1 CREATININE 1.19* 1.32* 1.54* 1.17* 1.13*  CALCIUM 9.6 9.6 9.6 9.5 9.5  MG  --   --   --   --  2.0  PHOS  --   --   --   --  3.4   Liver Function Tests: Recent Labs  Lab 06/03/20 0210  AST 35  ALT 38  ALKPHOS 76  BILITOT 0.5  PROT 7.1  ALBUMIN 3.6   No results for input(s): LIPASE, AMYLASE in the last 168 hours. No results for input(s): AMMONIA in the last 168 hours. CBC: Recent Labs  Lab 05/30/20 0400 05/31/20 0743 06/01/20 0321 06/02/20 0505 06/03/20 0210  WBC 4.7 5.5 6.8 6.6 6.3  NEUTROABS 2.1 3.1 3.8 3.6 3.0  HGB 10.0* 9.9* 9.4* 10.2* 9.6*  HCT 34.2* 34.8* 33.4* 35.0* 33.7*  MCV 70.2* 70.4* 70.0* 70.1* 69.3*  PLT 179 168 169 168 170   Cardiac Enzymes: No results for input(s): CKTOTAL, CKMB, CKMBINDEX, TROPONINI in the last 168 hours. BNP: BNP (last 3 results) No results for input(s): BNP in the last 8760  hours.  ProBNP (last 3 results) No results for input(s): PROBNP in the last 8760 hours.  CBG: Recent Labs  Lab 06/03/20 1138 06/03/20 1543 06/03/20 2119 06/04/20 0609 06/04/20 1215  GLUCAP 212* 214* 157* 112* 191*       Signed:  Marylu Lund MD.  [facility] 06/04/2020, 12:46 PM

## 2020-06-03 NOTE — Plan of Care (Signed)
  Problem: Education: Goal: Knowledge of General Education information will improve Description: Including pain rating scale, medication(s)/side effects and non-pharmacologic comfort measures Outcome: Adequate for Discharge   Problem: Clinical Measurements: Goal: Ability to maintain clinical measurements within normal limits will improve Outcome: Adequate for Discharge Goal: Will remain free from infection Outcome: Adequate for Discharge Goal: Diagnostic test results will improve Outcome: Adequate for Discharge Goal: Respiratory complications will improve Outcome: Adequate for Discharge Goal: Cardiovascular complication will be avoided Outcome: Adequate for Discharge   Problem: Activity: Goal: Risk for activity intolerance will decrease Outcome: Adequate for Discharge   Problem: Nutrition: Goal: Adequate nutrition will be maintained Outcome: Adequate for Discharge   Problem: Elimination: Goal: Will not experience complications related to bowel motility Outcome: Adequate for Discharge Goal: Will not experience complications related to urinary retention Outcome: Adequate for Discharge   Problem: Pain Managment: Goal: General experience of comfort will improve Outcome: Adequate for Discharge   Problem: Skin Integrity: Goal: Risk for impaired skin integrity will decrease Outcome: Adequate for Discharge

## 2020-06-03 NOTE — Progress Notes (Signed)
MD paged and family called [name]) on [phone] about patient transportation/discharge issue. Patient will be picked up tomorrow by [facility] for discharge. Laura Atkins  .

## 2020-06-03 NOTE — Care Management Important Message (Signed)
Important Message  Patient Details  Name: Laura Atkins MRN: [id number] Date of Birth: 1941-04-06   Medicare Important Message Given:  Yes     Orbie Pyo 06/03/2020, 2:39 PM

## 2020-06-04 DIAGNOSIS — E785 Hyperlipidemia, unspecified: Secondary | ICD-10-CM | POA: Diagnosis not present

## 2020-06-04 DIAGNOSIS — G9341 Metabolic encephalopathy: Secondary | ICD-10-CM | POA: Diagnosis not present

## 2020-06-04 DIAGNOSIS — D560 Alpha thalassemia: Secondary | ICD-10-CM

## 2020-06-04 DIAGNOSIS — N390 Urinary tract infection, site not specified: Secondary | ICD-10-CM | POA: Diagnosis not present

## 2020-06-04 DIAGNOSIS — G4733 Obstructive sleep apnea (adult) (pediatric): Secondary | ICD-10-CM | POA: Diagnosis not present

## 2020-06-04 DIAGNOSIS — N179 Acute kidney failure, unspecified: Secondary | ICD-10-CM | POA: Diagnosis not present

## 2020-06-04 DIAGNOSIS — E119 Type 2 diabetes mellitus without complications: Secondary | ICD-10-CM | POA: Diagnosis not present

## 2020-06-04 DIAGNOSIS — B999 Unspecified infectious disease: Secondary | ICD-10-CM | POA: Diagnosis not present

## 2020-06-04 DIAGNOSIS — I10 Essential (primary) hypertension: Secondary | ICD-10-CM | POA: Diagnosis not present

## 2020-06-04 DIAGNOSIS — E611 Iron deficiency: Secondary | ICD-10-CM | POA: Diagnosis not present

## 2020-06-04 DIAGNOSIS — E1165 Type 2 diabetes mellitus with hyperglycemia: Secondary | ICD-10-CM | POA: Diagnosis not present

## 2020-06-04 LAB — GLUCOSE, CAPILLARY
Glucose-Capillary: 112 mg/dL — ABNORMAL HIGH (ref 70–99)
Glucose-Capillary: 191 mg/dL — ABNORMAL HIGH (ref 70–99)

## 2020-06-04 LAB — SARS CORONAVIRUS 2 BY RT PCR (HOSPITAL ORDER, PERFORMED IN ~~LOC~~ HOSPITAL LAB): SARS Coronavirus 2: NEGATIVE

## 2020-06-04 NOTE — TOC Transition Note (Addendum)
Transition of Care [facility]) - CM/SW Discharge Note   Patient Details  Name: [name] MRN: [id number] Date of Birth: [birth year]  Transition of Care [facility]) CM/SW Contact:  Emeterio Reeve, [place] Phone Number: 06/04/2020, 1:38 PM   Clinical Narrative:    Pt to discharge to [place] via ptar. [facility] has been called. Pts daughter [name] was notified via phone.  Nurse to call report to [phone].   Final next level of care: Skilled Nursing Facility Barriers to Discharge: Barriers Resolved   Patient Goals and CMS Choice Patient states their goals for this hospitalization and ongoing recovery are:: patient unable to participate in goal setting due to disorientation CMS Medicare.gov Compare Post Acute Care list provided to:: Patient Represenative (must comment) Choice offered to / list presented to : Adult Children  Discharge Placement              Patient chooses bed at: [facility] Patient to be transferred to facility by: [place] Name of family member notified: [name] Patient and family notified of of transfer: 06/03/20  Discharge Plan and Services     Post Acute Care Choice: [place]                               Social Determinants of Health (SDOH) Interventions     Readmission Risk Interventions No flowsheet data found.  Emeterio Reeve, Latanya Presser, [place] Social Worker [phone]

## 2020-06-04 NOTE — Plan of Care (Signed)
  Problem: Education: Goal: Knowledge of General Education information will improve Description: Including pain rating scale, medication(s)/side effects and non-pharmacologic comfort measures 06/04/2020 0810 by Caroll Rancher, RN Outcome: Adequate for Discharge 06/04/2020 0809 by Caroll Rancher, RN Outcome: Progressing

## 2020-06-04 NOTE — Progress Notes (Signed)
Patient being discharged to the [facility].  Patient to be transported by [facility].  No IV to remove.  No telemetry on the patient. Discharge instructions and prescription information placed in the packet for transport.

## 2020-06-04 NOTE — Progress Notes (Signed)
PROGRESS NOTE    Laura Atkins  STM:[id number] DOB: 04/19/1941 DOA: 05/27/2020 PCP: Fayrene Helper, MD   Assessment & Plan:   Active Problems:   Alpha thalassemia (HCC)   Essential hypertension   GERD   Altered mental status   Seizure-like activity (HCC)   OSA (obstructive sleep apnea)   Type 2 diabetes mellitus with hyperglycemia (HCC)   Hyperlipidemia associated with type 2 diabetes mellitus ([place])   AKI (acute kidney injury) ([place])   Infection   Cognitive deficits  Acute metabolic encephalopathy Provoked seizure in the setting of UTI and AKI Possibly underlying undiagnosed dementia No witnessed seizures since admission History of provoked seizure in 2013 2/2 Xanax withdrawal, was placed on Keppra and has been off for the last 6 years CT head showed no acute or reversible finding MRI brain showed no acute intracranial abnormality EEG showed no seizure or epileptiform discharges UDS negative wellbutrin was d/c'd at discharge Neurology consulted, no need for AED unless unprovoked seizures, outpatient follow-up for possibly underlying dementia in 2 to 3 months Continue seizure precautions   AKI on chronic kidney disease stage IIIa Baseline creatinine 0.9-1.3, elevated at 2.7 on admission S/p gentle hydration  E. coliUTI UA positive for infection UC growing> 100,000E. coli BC X 2 NGTD S/pIV ceftriaxone-->switched to p.o. Keflex for 5 total days therapy  Hypertension BP stable Continue Norvasc, hold benazepril due to AKI - follow outpatient to determine if ok to resume  Diabetes mellitus type 2, uncontrolled A1c 9.5, uncontrolled Noted to be hypoglycemic on 05/30/2020,adjusted SSI SSI, 70/30, accuchecks, hypoglycemic protocoladjust prn -> decreased to 13 units 70/30 BID - follow BG's ACHS outpatient  Microcytic anemia Hemoglobin at baseline folate 10.8, Vit B12 458 Daily CBC Follow iron studies outpatient  Hyperlipidemia Continue  statin  Morbid obesity Lifestyle modification advised   Antimicrobials: Anti-infectives (From admission, onward)   Start     Dose/Rate Route Frequency Ordered Stop   05/31/20 1400  cephALEXin (KEFLEX) capsule 500 mg        500 mg Oral Every 8 hours 05/31/20 1058 06/02/20 0559   05/28/20 1600  cefTRIAXone (ROCEPHIN) 1 g in sodium chloride 0.9 % 100 mL IVPB  Status:  Discontinued        1 g 200 mL/hr over 30 Minutes Intravenous Every 24 hours 05/28/20 1125 05/31/20 1058   05/28/20 0800  meropenem (MERREM) 1 g in sodium chloride 0.9 % 100 mL IVPB        1 g 200 mL/hr over 30 Minutes Intravenous  Once 05/28/20 0747 05/28/20 0950   05/28/20 0730  ciprofloxacin (CIPRO) IVPB 400 mg  Status:  Discontinued        400 mg 200 mL/hr over 60 Minutes Intravenous Every 12 hours 05/28/20 0727 05/28/20 0727       Subjective: Feeling anxious about going to rehab  Objective: Vitals:   06/03/20 1956 06/03/20 2338 06/04/20 0444 06/04/20 1217  BP: (!) 146/60 (!) 162/68 135/65 129/70  Pulse: 83 82 76 81  Resp: 15 18 16 18   Temp: 98.9 F (37.2 C) 98.4 F (36.9 C) 98.8 F (37.1 C) 98.2 F (36.8 C)  TempSrc: Oral   Oral  SpO2: 99% 98% 99% 100%  Weight:      Height:       No intake or output data in the 24 hours ending 06/04/20 1241 Filed Weights   05/27/20 1301  Weight: 122.5 kg    Examination:  General exam: Appears calm and comfortable  Respiratory  system: Clear to auscultation. Respiratory effort normal.  Data Reviewed: I have personally reviewed following labs and imaging studies  CBC: Recent Labs  Lab 05/30/20 0400 05/31/20 0743 06/01/20 0321 06/02/20 0505 06/03/20 0210  WBC 4.7 5.5 6.8 6.6 6.3  NEUTROABS 2.1 3.1 3.8 3.6 3.0  HGB 10.0* 9.9* 9.4* 10.2* 9.6*  HCT 34.2* 34.8* 33.4* 35.0* 33.7*  MCV 70.2* 70.4* 70.0* 70.1* 69.3*  PLT 179 168 169 168 748   Basic Metabolic Panel: Recent Labs  Lab 05/30/20 0400 05/31/20 0743 06/01/20 0321 06/02/20 0505 06/03/20 0210   NA 143 142 139 137 138  K 4.0 3.9 3.7 3.8 3.7  CL 108 105 105 101 101  CO2 27 23 24 24 26   GLUCOSE 116* 119* 123* 213* 145*  BUN 17 16 22 19 19   CREATININE 1.19* 1.32* 1.54* 1.17* 1.13*  CALCIUM 9.6 9.6 9.6 9.5 9.5  MG  --   --   --   --  2.0  PHOS  --   --   --   --  3.4   GFR: Estimated Creatinine Clearance: 58.4 mL/min (A) (by C-G formula based on SCr of 1.13 mg/dL (H)). Liver Function Tests: Recent Labs  Lab 06/03/20 0210  AST 35  ALT 38  ALKPHOS 76  BILITOT 0.5  PROT 7.1  ALBUMIN 3.6   No results for input(s): LIPASE, AMYLASE in the last 168 hours. No results for input(s): AMMONIA in the last 168 hours. Coagulation Profile: No results for input(s): INR, PROTIME in the last 168 hours. Cardiac Enzymes: No results for input(s): CKTOTAL, CKMB, CKMBINDEX, TROPONINI in the last 168 hours. BNP (last 3 results) No results for input(s): PROBNP in the last 8760 hours. HbA1C: No results for input(s): HGBA1C in the last 72 hours. CBG: Recent Labs  Lab 06/03/20 1138 06/03/20 1543 06/03/20 2119 06/04/20 0609 06/04/20 1215  GLUCAP 212* 214* 157* 112* 191*   Lipid Profile: No results for input(s): CHOL, HDL, LDLCALC, TRIG, CHOLHDL, LDLDIRECT in the last 72 hours. Thyroid Function Tests: No results for input(s): TSH, T4TOTAL, FREET4, T3FREE, THYROIDAB in the last 72 hours. Anemia Panel: No results for input(s): VITAMINB12, FOLATE, FERRITIN, TIBC, IRON, RETICCTPCT in the last 72 hours. Sepsis Labs: No results for input(s): PROCALCITON, LATICACIDVEN in the last 168 hours.  Recent Results (from the past 240 hour(s))  SARS Coronavirus 2 by RT PCR (hospital order, performed in [facility] hospital lab) Nasopharyngeal Nasopharyngeal Swab     Status: None   Collection Time: 05/27/20  7:31 PM   Specimen: Nasopharyngeal Swab  Result Value Ref Range Status   SARS Coronavirus 2 NEGATIVE NEGATIVE Final    Comment: (NOTE) SARS-CoV-2 target nucleic acids are NOT DETECTED.  The  SARS-CoV-2 RNA is generally detectable in upper and lower respiratory specimens during the acute phase of infection. The lowest concentration of SARS-CoV-2 viral copies this assay can detect is 250 copies / mL. A negative result does not preclude SARS-CoV-2 infection and should not be used as the sole basis for treatment or other patient management decisions.  A negative result may occur with improper specimen collection / handling, submission of specimen other than nasopharyngeal swab, presence of viral mutation(s) within the areas targeted by this assay, and inadequate number of viral copies (<250 copies / mL). A negative result must be combined with clinical observations, patient history, and epidemiological information.  Fact Sheet for Patients:   StrictlyIdeas.no  Fact Sheet for Healthcare Providers: BankingDealers.co.za  This test is not yet approved  or  cleared by the [place] and has been authorized for detection and/or diagnosis of SARS-CoV-2 by FDA under an Emergency Use Authorization (EUA).  This EUA will remain in effect (meaning this test can be used) for the duration of the COVID-19 declaration under Section 564(b)(1) of the Act, 21 U.S.C. section 360bbb-3(b)(1), unless the authorization is terminated or revoked sooner.  Performed at [facility], [address], [place], [place] [address]   Culture, Urine     Status: Abnormal   Collection Time: 05/28/20 12:29 PM   Specimen: Urine, Clean Catch  Result Value Ref Range Status   Specimen Description   Final    URINE, CLEAN CATCH Performed at [facility], [address] [place], [place] [address]    Special Requests   Final    NONE Performed at [facility], [address] [place], [place] [address]    Culture >=100,000 COLONIES/mL ESCHERICHIA COLI (A)  Final   Report Status 05/31/2020 FINAL  Final   Organism ID, Bacteria ESCHERICHIA COLI (A)  Final       Susceptibility   Escherichia coli - MIC*    AMPICILLIN >=32 RESISTANT Resistant     CEFAZOLIN <=4 SENSITIVE Sensitive     CEFTRIAXONE <=0.25 SENSITIVE Sensitive     CIPROFLOXACIN <=0.25 SENSITIVE Sensitive     GENTAMICIN >=16 RESISTANT Resistant     IMIPENEM <=0.25 SENSITIVE Sensitive     NITROFURANTOIN <=16 SENSITIVE Sensitive     TRIMETH/SULFA >=320 RESISTANT Resistant     AMPICILLIN/SULBACTAM 16 INTERMEDIATE Intermediate     PIP/TAZO <=4 SENSITIVE Sensitive     * >=100,000 COLONIES/mL ESCHERICHIA COLI  Culture, blood (routine x 2)     Status: None   Collection Time: 05/29/20  1:50 PM   Specimen: BLOOD  Result Value Ref Range Status   Specimen Description BLOOD RIGHT ANTECUBITAL  Final   Special Requests   Final    BOTTLES DRAWN AEROBIC AND ANAEROBIC Blood Culture adequate volume   Culture   Final    NO GROWTH 5 DAYS Performed at [facility], [address] [place], [place] [address]    Report Status 06/03/2020 FINAL  Final  Culture, blood (routine x 2)     Status: None   Collection Time: 05/29/20  1:51 PM   Specimen: BLOOD RIGHT HAND  Result Value Ref Range Status   Specimen Description BLOOD RIGHT HAND  Final   Special Requests   Final    BOTTLES DRAWN AEROBIC AND ANAEROBIC Blood Culture adequate volume   Culture   Final    NO GROWTH 5 DAYS Performed at [facility], [place] [name] [place], [place] [address]    Report Status 06/03/2020 FINAL  Final     Radiology Studies: No results found.  Scheduled Meds: . amLODipine  5 mg Oral Daily  . aspirin EC  81 mg Oral Daily  . atorvastatin  40 mg Oral Daily  . heparin  5,000 Units Subcutaneous Q8H  . insulin aspart  0-6 Units Subcutaneous TID WC  . insulin aspart protamine- aspart  13 Units Subcutaneous BID WC  . pantoprazole  40 mg Oral Daily   Continuous Infusions:   LOS: 8 days   Marylu Lund, MD [facility] Pager On Amion  If 7PM-7AM, please contact night-coverage 06/04/2020, 12:41 PM

## 2020-06-08 DIAGNOSIS — D560 Alpha thalassemia: Secondary | ICD-10-CM | POA: Diagnosis not present

## 2020-06-08 DIAGNOSIS — N179 Acute kidney failure, unspecified: Secondary | ICD-10-CM | POA: Diagnosis not present

## 2020-06-08 DIAGNOSIS — N390 Urinary tract infection, site not specified: Secondary | ICD-10-CM | POA: Diagnosis not present

## 2020-06-08 DIAGNOSIS — I10 Essential (primary) hypertension: Secondary | ICD-10-CM | POA: Diagnosis not present

## 2020-06-09 DIAGNOSIS — I10 Essential (primary) hypertension: Secondary | ICD-10-CM | POA: Diagnosis not present

## 2020-06-09 DIAGNOSIS — G4733 Obstructive sleep apnea (adult) (pediatric): Secondary | ICD-10-CM | POA: Diagnosis not present

## 2020-06-09 DIAGNOSIS — N179 Acute kidney failure, unspecified: Secondary | ICD-10-CM | POA: Diagnosis not present

## 2020-06-09 DIAGNOSIS — N390 Urinary tract infection, site not specified: Secondary | ICD-10-CM | POA: Diagnosis not present

## 2020-06-09 DIAGNOSIS — D560 Alpha thalassemia: Secondary | ICD-10-CM | POA: Diagnosis not present

## 2020-06-13 ENCOUNTER — Other Ambulatory Visit: Payer: Self-pay | Admitting: Family Medicine

## 2020-06-13 ENCOUNTER — Other Ambulatory Visit: Payer: Self-pay | Admitting: Cardiology

## 2020-06-24 ENCOUNTER — Other Ambulatory Visit: Payer: Self-pay | Admitting: Endocrinology

## 2020-06-24 DIAGNOSIS — E1165 Type 2 diabetes mellitus with hyperglycemia: Secondary | ICD-10-CM

## 2020-06-24 DIAGNOSIS — Z794 Long term (current) use of insulin: Secondary | ICD-10-CM

## 2020-06-27 ENCOUNTER — Inpatient Hospital Stay: Payer: Medicare HMO | Admitting: Internal Medicine

## 2020-06-27 ENCOUNTER — Other Ambulatory Visit: Payer: Self-pay

## 2020-06-27 NOTE — Patient Outreach (Signed)
[facility]) Care Management  06/27/2020  Laura Atkins 1940-10-13 [id number]   Referral Date: 06/27/20 Referral Source: [employer] Report Date of Discharge: 06/24/20 Facility:  [place]: [facility]   Referral received.  No outreach warranted at this time.  Transition of Care calls being completed via EMMI. RN CM will outreach patient for any red flags received.    Plan: RN CM will close case.    Jone Baseman, RN, MSN [facility] Care Management Care Management Coordinator Direct Line [phone] Toll Free: [phone]  Fax: [phone]

## 2020-06-30 ENCOUNTER — Ambulatory Visit (INDEPENDENT_AMBULATORY_CARE_PROVIDER_SITE_OTHER): Payer: Medicare HMO | Admitting: Endocrinology

## 2020-06-30 ENCOUNTER — Other Ambulatory Visit: Payer: Self-pay

## 2020-06-30 VITALS — BP 148/64 | HR 67

## 2020-06-30 DIAGNOSIS — E1165 Type 2 diabetes mellitus with hyperglycemia: Secondary | ICD-10-CM | POA: Diagnosis not present

## 2020-06-30 DIAGNOSIS — Z794 Long term (current) use of insulin: Secondary | ICD-10-CM

## 2020-06-30 LAB — POCT GLYCOSYLATED HEMOGLOBIN (HGB A1C): Hemoglobin A1C: 8.3 % — AB (ref 4.0–5.6)

## 2020-06-30 MED ORDER — FREESTYLE LIBRE 14 DAY SENSOR MISC: 1.0000 | 3 refills | Status: DC

## 2020-06-30 MED ORDER — NOVOLIN 70/30 RELION (70-30) 100 UNIT/ML ~~LOC~~ SUSP
SUBCUTANEOUS | 11 refills | Status: DC
Start: 2020-06-30 — End: 2020-08-10

## 2020-06-30 NOTE — Progress Notes (Signed)
Subjective:    Patient ID: Laura Atkins, female    DOB: 1941/08/09, 79 y.o.   MRN: [id number]  HPI Pt returns for f/u of diabetes mellitus: DM type: insulin-requiring type 2.   Dx'ed: 1700 Complications: polyneuropathy, PAD, and foot ulcer.   Therapy: insulin since 2005.   GDM: never.  DKA: never Severe hypoglycemia: last episode was 2020.    Pancreatitis: never.   SDOH: She takes human insulin, due to cost.   Other info: in early 2016, she changed to BID premixed insulin, due to poor results with multiple daily injections.   Interval history: dtr Reg[facility]) brings cbg record.  glucose varies from 110-215.  It is in general higher as the day goes on.  says pt missed the insulin once since last ov; pt states she feels better in general, since recent hosp visit for urosepsis.  She has been home x 6 days.    Past Medical History:  Diagnosis Date  . Alpha thalassemia trait 01/26/2010   02/2012: Nl CBC ex H&H-10.7/34.8, MCV-69   . Anemia   . Anxiety   . Anxiety and depression   . Cellulitis 05/09/2017  . Depression   . Diabetes mellitus   . Foot pain, right 04/30/2013  . GERD (gastroesophageal reflux disease)   . Headache(784.0)   . Hyperlipidemia   . Hypertension   . Iron deficiency 01/21/2017  . Microcytic anemia 01/26/2010   02/2012: Nl CBC ex H&H-10.7/34.8, MCV-69   . NECK PAIN, CHRONIC 10/21/2008   +chronic back pain    . Obesity   . Obstructive sleep apnea   . Osteoarthritis    Left knee; right shoulder; chronic neck and back pain  . Pruritus   . PVD (peripheral vascular disease) ([place]) 01/28/2014  . Seizures ([place])   . Shoulder pain, right 04/14/2015  . Tremor    This started months ago after her seizure progressing to very poor hand writing  . Urinary incontinence   . UTI (urinary tract infection) 01/18/2013    Past Surgical History:  Procedure Laterality Date  . ABDOMINAL HYSTERECTOMY    . BREAST EXCISIONAL BIOPSY     Left; cyst  . CATARACT EXTRACTION Right     12/2017  . CATARACT EXTRACTION W/ INTRAOCULAR LENS IMPLANT Left 09/07/2013  . CHOLECYSTECTOMY    . COLONOSCOPY    . COLONOSCOPY N/A 07/20/2015   Procedure: COLONOSCOPY;  Surgeon: Rogene Houston, MD;  Location: [facility];  Service: Endoscopy;  Laterality: N/A;  930  . EYE SURGERY Left 09/07/2013   cataract    Social History   Socioeconomic History  . Marital status: Married    Spouse name: saunders  . Number of children: 5  . Years of education: 94  . Highest education level: Not on file  Occupational History  . Occupation: Disabled     Employer: RETIRED  Tobacco Use  . Smoking status: Never Smoker  . Smokeless tobacco: Never Used  Vaping Use  . Vaping Use: Never used  Substance and Sexual Activity  . Alcohol use: No    Alcohol/week: 0.0 standard drinks  . Drug use: No  . Sexual activity: Yes    Birth control/protection: Surgical  Other Topics Concern  . Not on file  Social History Narrative   Patient lives at home with her husband [name].  Patient is retired.    Right handed.    Five Children.    Caffeine- 2 daily   Social Determinants of Health  Financial Resource Strain: Low Risk   . Difficulty of Paying Living Expenses: Not very hard  Food Insecurity: No Food Insecurity  . Worried About Charity fundraiser in the Last Year: Never true  . Ran Out of Food in the Last Year: Never true  Transportation Needs: No Transportation Needs  . Lack of Transportation (Medical): No  . Lack of Transportation (Non-Medical): No  Physical Activity: Inactive  . Days of Exercise per Week: 0 days  . Minutes of Exercise per Session: 0 min  Stress: No Stress Concern Present  . Feeling of Stress : Only a little  Social Connections: Moderately Integrated  . Frequency of Communication with Friends and Family: Three times a week  . Frequency of Social Gatherings with Friends and Family: Three times a week  . Attends Religious Services: More than 4 times per year  . Active  Member of Clubs or Organizations: No  . Attends Archivist Meetings: Never  . Marital Status: Married  Human resources officer Violence:   . Fear of Current or Ex-Partner: Not on file  . Emotionally Abused: Not on file  . Physically Abused: Not on file  . Sexually Abused: Not on file    Current Outpatient Medications on File Prior to Visit  Medication Sig Dispense Refill  . Accu-Chek FastClix Lancets MISC 1 each by Does not apply route 4 (four) times daily. Use to monitor glucose levels 4 times daily; E11.65 306 each 2  . Alcohol Swabs (B-D SINGLE USE SWABS REGULAR) PADS 1 each by Does not apply route 4 (four) times daily. Use to prep site for glucose monitoring 4 times daily; E11.65 300 each 2  . amLODipine (NORVASC) 5 MG tablet Take 1 tablet (5 mg total) by mouth daily. 30 tablet 3  . aspirin 81 MG tablet Take 81 mg by mouth daily.      Marland Kitchen atorvastatin (LIPITOR) 40 MG tablet TAKE 1 TABLET BY MOUTH EVERY DAY 30 tablet 2  . Blood Glucose Monitoring Suppl (ACCU-CHEK GUIDE ME) w/Device KIT 1 each by Does not apply route 4 (four) times daily. Use to monitor glucose levels 4 times daily; E11.65 1 kit 0  . Cholecalciferol 125 MCG (5000 UT) TABS Take 1 tablet by mouth daily.    . cyclobenzaprine (FLEXERIL) 5 MG tablet Take one  Tablet by mouth at bedtime for 1 week, then as needed 15 tablet 0  . diphenhydrAMINE (BENADRYL) 25 MG tablet Take 25 mg by mouth at bedtime.    . ferrous sulfate 325 (65 FE) MG tablet Take 1 tablet (325 mg total) by mouth daily with breakfast. 30 tablet 5  . gabapentin (NEURONTIN) 400 MG capsule TAKE ONE CAPSULE BY MOUTH EVERY MORNING and TAKE ONE CAPSULE AT NOON and TAKE TWO CAPSULES AT BEDTIME 360 capsule 1  . glucose blood (ACCU-CHEK GUIDE) test strip 1 each by Other route 4 (four) times daily. 200 strip 12  . Insulin Syringe-Needle U-100 (INSULIN SYRINGE 1CC/31GX5/16") 31G X 5/16" 1 ML MISC 1 each by Does not apply route daily. Use to inject insulin daily; E11.65 90  each 2  . Multiple Vitamin (MULTIVITAMIN) capsule Take 1 capsule by mouth daily.    Marland Kitchen omeprazole (PRILOSEC) 20 MG capsule TAKE ONE CAPSULE BY MOUTH EVERY DAY 30 capsule 2  . potassium chloride SA (KLOR-CON) 20 MEQ tablet TAKE 1 TABLET BY MOUTH TWICE DAILY 180 tablet 1  . sertraline (ZOLOFT) 100 MG tablet Take 1 tablet (100 mg total) by mouth daily. [place]  tablet 5  . UNABLE TO FIND Walker x 1  DX unsteady gait, osteoarthritis of left knee 1 each 0  . UNABLE TO FIND Elevated [place] Seat x 1 DX: unsteady gait, back pain, osteoarthritis of left knee 1 each 0  . UNABLE TO FIND Standing upright walker x 1  DX M54.40, M19.90 1 each 0   No current facility-administered medications on file prior to visit.    Allergies  Allergen Reactions  . Penicillins Shortness Of Breath, Itching and Rash  . Prednisone Shortness Of Breath, Itching and Rash  . Propoxyphene N-Acetaminophen Itching and Nausea And Vomiting  . Sulfa Antibiotics Itching and Nausea And Vomiting    Family History  Problem Relation Age of Onset  . Lung cancer Mother 28  . Kidney disease Father   . Diabetes Sister   . Keloids Brother   . ADD / ADHD Grandchild   . Bipolar disorder Grandchild   . Bipolar disorder Daughter   . Seizures Daughter   . Heart disease Daughter   . Kidney disease Son   . Neuropathy Son   . Kidney disease Son   . Edema Daughter   . Breast cancer Daughter 18  . Allergies Daughter   . Alcohol abuse Neg Hx   . Drug abuse Neg Hx     BP (!) 148/64   Pulse 67   SpO2 94%    Review of Systems She denies hypoglycemia    Objective:   Physical Exam VITAL SIGNS:  See vs page GENERAL: no distress Pulses: dorsalis pedis absent bilat (poss due to edema).   MSK: no deformity of the feet CV: 2+ bilat bilat leg edema Skin:  no ulcer on the feet.  normal temp on the feet.  There is hyperpigmentation of the legs and feet. Neuro: sensation is intact to touch on the feet, but decreased from normal.   Ext:  there is bilateral onychomycosis of the toenails.    A1c=8.3%  Lab Results  Component Value Date   CREATININE 1.13 (H) 06/03/2020   BUN 19 06/03/2020   NA 138 06/03/2020   K 3.7 06/03/2020   CL 101 06/03/2020   CO2 26 06/03/2020       Assessment & Plan:  HTN: is noted today Insulin-requiring type 2 DM: uncontrolled.  Urosepsis: this has reduced insulin requirement for now  Patient Instructions  Your blood pressure is high today.  Please see your primary care provider soon, to have it rechecked check your blood sugar twice a day.  vary the time of day when you check, between before the 3 meals, and at bedtime.  also check if you have symptoms of your blood sugar being too high or too low.  please keep a record of the readings and bring it to your next appointment here (or you can bring the meter itself).  You can write it on any piece of paper.  please call us sooner if your blood sugar goes below 70, or if you have a lot of readings over 200. Please continue the same insulin: 20 units with breakfast, and 10 units with supper.  As you recover from your illness, you insulin need will increase back to what it was before your illness.  On this type of insulin schedule, you should eat meals on a regular schedule.  If a meal is missed or significantly delayed, your blood sugar could go low.   I have sent a prescription to your pharmacy, for continuous glucose monitor sensors.  Please come back for a follow-up appointment in 6 weeks.

## 2020-06-30 NOTE — Patient Instructions (Addendum)
Your blood pressure is high today.  Please see your primary care provider soon, to have it rechecked check your blood sugar twice a day.  vary the time of day when you check, between before the 3 meals, and at bedtime.  also check if you have symptoms of your blood sugar being too high or too low.  please keep a record of the readings and bring it to your next appointment here (or you can bring the meter itself).  You can write it on any piece of paper.  please call us sooner if your blood sugar goes below 70, or if you have a lot of readings over 200. Please continue the same insulin: 20 units with breakfast, and 10 units with supper.  As you recover from your illness, you insulin need will increase back to what it was before your illness.  On this type of insulin schedule, you should eat meals on a regular schedule.  If a meal is missed or significantly delayed, your blood sugar could go low.   I have sent a prescription to your pharmacy, for continuous glucose monitor sensors.   Please come back for a follow-up appointment in 6 weeks.

## 2020-07-08 ENCOUNTER — Telehealth: Payer: Self-pay

## 2020-07-08 ENCOUNTER — Other Ambulatory Visit: Payer: Self-pay | Admitting: Family Medicine

## 2020-07-08 NOTE — Telephone Encounter (Signed)
She does have an appointment scheduled with me please give her that information

## 2020-07-11 NOTE — Telephone Encounter (Signed)
Pt informed

## 2020-07-13 ENCOUNTER — Ambulatory Visit (INDEPENDENT_AMBULATORY_CARE_PROVIDER_SITE_OTHER): Payer: Medicare HMO | Admitting: Family Medicine

## 2020-07-13 ENCOUNTER — Encounter: Payer: Self-pay | Admitting: Family Medicine

## 2020-07-13 ENCOUNTER — Other Ambulatory Visit: Payer: Self-pay

## 2020-07-13 VITALS — BP 158/60 | HR 71 | Resp 16 | Ht 68.0 in | Wt 254.0 lb

## 2020-07-13 DIAGNOSIS — F0390 Unspecified dementia without behavioral disturbance: Secondary | ICD-10-CM | POA: Diagnosis not present

## 2020-07-13 DIAGNOSIS — R8290 Unspecified abnormal findings in urine: Secondary | ICD-10-CM | POA: Diagnosis not present

## 2020-07-13 DIAGNOSIS — Z7409 Other reduced mobility: Secondary | ICD-10-CM | POA: Diagnosis not present

## 2020-07-13 DIAGNOSIS — I10 Essential (primary) hypertension: Secondary | ICD-10-CM | POA: Diagnosis not present

## 2020-07-13 DIAGNOSIS — N39498 Other specified urinary incontinence: Secondary | ICD-10-CM

## 2020-07-13 DIAGNOSIS — E1165 Type 2 diabetes mellitus with hyperglycemia: Secondary | ICD-10-CM | POA: Diagnosis not present

## 2020-07-13 DIAGNOSIS — E785 Hyperlipidemia, unspecified: Secondary | ICD-10-CM

## 2020-07-13 DIAGNOSIS — E1169 Type 2 diabetes mellitus with other specified complication: Secondary | ICD-10-CM | POA: Diagnosis not present

## 2020-07-13 DIAGNOSIS — G4733 Obstructive sleep apnea (adult) (pediatric): Secondary | ICD-10-CM | POA: Diagnosis not present

## 2020-07-13 DIAGNOSIS — Z794 Long term (current) use of insulin: Secondary | ICD-10-CM

## 2020-07-13 MED ORDER — DONEPEZIL HCL 5 MG PO TABS: 5.0000 mg | ORAL_TABLET | Freq: Every day | ORAL | 2 refills | Status: DC

## 2020-07-13 NOTE — Patient Instructions (Addendum)
F/u in office with MD , re evaluate dementia first week in January, call if you ned me sooner  Nurse please formally document MMSE prior to patient leaving today  Nurse please clarify influenza vaccine status, and follow through on this  New for dementia is Aricept 5 mg one daily, sent to [place] are referred to  Pulmonary in [place] to eval sleep apnea  We will attempt to see if you can get incontinence supplies due to incontinence with dementia diagnosis through your insurance, including [employer]  Please work with "those people" and start getting up to go to the bathroom with help, while awake every 2 to 3 hours so less wetting on yourself  Family member to take him cup with lab order for Urine c/s today please, directly sub,mit speimen to lab, c/o malodorous urine

## 2020-07-13 NOTE — Assessment & Plan Note (Signed)
Laura Atkins is reminded of the importance of commitment to daily physical activity for 30 minutes or more, as able and the need to limit carbohydrate intake to 30 to 60 grams per meal to help with blood sugar control.   The need to take medication as prescribed, test blood sugar as directed, and to call between visits if there is a concern that blood sugar is uncontrolled is also discussed.   Laura Atkins is reminded of the importance of daily foot exam, annual eye examination, and good blood sugar, blood pressure and cholesterol control. Improved, managed by Endo Diabetic Labs Latest Ref Rng & Units 06/30/2020 06/03/2020 06/02/2020 06/01/2020 05/31/2020  HbA1c 4.0 - 5.6 % 8.3(A) - - - -  Microalbumin mg/dL - - - - -  Micro/Creat Ratio 0.0 - 30.0 mg/g creat - - - - -  Chol <200 mg/dL - - - - -  HDL > OR = 50 mg/dL - - - - -  Calc LDL mg/dL (calc) - - - - -  Triglycerides <150 mg/dL - - - - -  Creatinine 0.44 - 1.00 mg/dL - 1.13(H) 1.17(H) 1.54(H) 1.32(H)   BP/Weight 07/13/2020 06/30/2020 06/04/2020 05/27/2020 05/23/2020 05/17/2020 0/62/3762  Systolic BP 831 517 616 - 073 710 626  Diastolic BP 60 64 70 - 66 56 63  Wt. (Lbs) 254.04 - - 270 270 257.8 -  BMI 38.63 - - 41.05 41.05 39.2 41.17  Some encounter information is confidential and restricted. Go to Review Flowsheets activity to see all data.   Foot/eye exam completion dates Latest Ref Rng & Units 06/30/2020 05/09/2020  Eye Exam No Retinopathy - -  Foot Form Completion - Done Done

## 2020-07-13 NOTE — Assessment & Plan Note (Signed)
High fall risk , ome safety reviewed

## 2020-07-13 NOTE — Assessment & Plan Note (Signed)
Not using CPAP, has no machine currently, refer to Pulmonary for re evaluation

## 2020-07-13 NOTE — Assessment & Plan Note (Signed)
Hyperlipidemia:Low fat diet discussed and encouraged.   Lipid Panel  Lab Results  Component Value Date   CHOL 149 12/01/2019   HDL 58 12/01/2019   LDLCALC 72 12/01/2019   TRIG 100 12/01/2019   CHOLHDL 2.6 12/01/2019

## 2020-07-13 NOTE — Assessment & Plan Note (Signed)
Disorientation, impaired memory , and inappropriate behavior , eg recently unpacked cabinet of clothes, placed in trash and told daughter they were moving, also willingly sits and urinates on herself , resisting attempts at scheduled voiding with assistance Start Aricept 5 mg daily and re evaluate in 2  months

## 2020-07-13 NOTE — Assessment & Plan Note (Signed)
Reportedly sits and wets on herself and often refuses to go to commode , even with assistance. Bed soaked at nigh. Needs incontinence supplies

## 2020-07-13 NOTE — Assessment & Plan Note (Signed)
Uncontrolled with elevated readin at visit, will re address on returm

## 2020-07-13 NOTE — Progress Notes (Signed)
[place]     MRN: [id number]      DOB: 1940-12-22   HPI [name] is here for follow up and re-evaluation of chronic medical conditions, medication management and review of any available recent lab and radiology data.  Preventive health is updated, specifically  Cancer screening and Immunization.   Recent hospital course and SNF note reviewed at visit .  Three days at [facility], 8 days at [facility] then 20 days at rehab [facility] 06/24/2020  ROS History from daughter, patient incapable Denies recent fever or chills. Denies sinus pressure, nasal congestion, ear pain or sore throat. Denies chest congestion, productive cough or wheezing. Denies chest pains, palpitations c/o  leg swelling Denies abdominal pain, nausea, vomiting,diarrhea or constipation.   C/o malodorous urine and incontinence C/o  joint pain, swelling and limitation in mobility. Denies headaches, seizures, numbness, or tingling. Denies uncontrolled  depression, anxiety or insomnia. Denies skin break down or rash.   PE  BP (!) 158/60   Pulse 71   Resp 16   Ht 5\' 8"  (1.727 m)   Wt 254 lb 0.6 oz (115.2 kg)   SpO2 96%   BMI 38.63 kg/m   Patient alert anddisoriented cardiopulmonary distress.  HEENT: No facial asymmetry, EOMI,     Neck decreased ROM  Chest: Clear to auscultation bilaterally.  CVS: S1, S2 systolic  murmurs, no S3.Regular rate.  ABD: Soft non tender.   Ext: Trace bilateral  edema  MS: decreased  ROM spine, shoulders, hips and knees.  Skin: Intact, no ulcerations or rash noted.  Psych: Good eye contact, normal affect. Memory impaired, disoriented to time not anxious or depressed appearing.  CNS: CN 2-12 intact, power,  normal throughout.no focal deficits noted.   Assessment & Plan  Dementia ([place]) Disorientation, impaired memory , and inappropriate behavior , eg recently unpacked cabinet of clothes, placed in trash and told daughter they were moving, also willingly sits and urinates on  herself , resisting attempts at scheduled voiding with assistance Start Aricept 5 mg daily and re evaluate in 2  months  Hyperlipidemia associated with type 2 diabetes mellitus ([place]) Hyperlipidemia:Low fat diet discussed and encouraged.   Lipid Panel  Lab Results  Component Value Date   CHOL 149 12/01/2019   HDL 58 12/01/2019   LDLCALC 72 12/01/2019   TRIG 100 12/01/2019   CHOLHDL 2.6 12/01/2019       Limited mobility High fall risk , ome safety reviewed  Type 2 diabetes mellitus with hyperglycemia ([place]) [name] is reminded of the importance of commitment to daily physical activity for 30 minutes or more, as able and the need to limit carbohydrate intake to 30 to 60 grams per meal to help with blood sugar control.   The need to take medication as prescribed, test blood sugar as directed, and to call between visits if there is a concern that blood sugar is uncontrolled is also discussed.   [name] is reminded of the importance of daily foot exam, annual eye examination, and good blood sugar, blood pressure and cholesterol control. Improved, managed by Endo Diabetic Labs Latest Ref Rng & Units 06/30/2020 06/03/2020 06/02/2020 06/01/2020 05/31/2020  HbA1c 4.0 - 5.6 % 8.3(A) - - - -  Microalbumin mg/dL - - - - -  Micro/Creat Ratio 0.0 - 30.0 mg/g creat - - - - -  Chol <200 mg/dL - - - - -  HDL > OR = 50 mg/dL - - - - -  Calc  LDL mg/dL (calc) - - - - -  Triglycerides <150 mg/dL - - - - -  Creatinine 0.44 - 1.00 mg/dL - 1.13(H) 1.17(H) 1.54(H) 1.32(H)   BP/Weight 07/13/2020 06/30/2020 06/04/2020 05/27/2020 05/23/2020 05/17/2020 03/25/6377  Systolic BP 588 502 774 - 128 786 767  Diastolic BP 60 64 70 - 66 56 63  Wt. (Lbs) 254.04 - - 270 270 257.8 -  BMI 38.63 - - 41.05 41.05 39.2 41.17  Some encounter information is confidential and restricted. Go to Review Flowsheets activity to see all data.   Foot/eye exam completion dates Latest Ref Rng & Units 06/30/2020 05/09/2020  Eye Exam No  Retinopathy - -  Foot Form Completion - Done Done        URINARY INCONTINENCE Reportedly sits and wets on herself and often refuses to go to commode , even with assistance. Bed soaked at nigh. Needs incontinence supplies  Essential hypertension Uncontrolled with elevated readin at visit, will re address on returm   Morbid obesity  Patient re-educated about  the importance of commitment to a  minimum of 150 minutes of exercise per week as able.  The importance of healthy food choices with portion control discussed, as well as eating regularly and within a 12 hour window most days. The need to choose "clean , green" food 50 to 75% of the time is discussed, as well as to make water the primary drink and set a goal of 64 ounces water daily.    Weight /BMI 07/13/2020 06/30/2020 05/27/2020  WEIGHT 254 lb 0.6 oz - 270 lb  HEIGHT 5\' 8"  - 5\' 8"   BMI 38.63 kg/m2 - 41.05 kg/m2  Some encounter information is confidential and restricted. Go to Review Flowsheets activity to see all data.  obesity linked with HTN, DM

## 2020-07-13 NOTE — Assessment & Plan Note (Signed)
  Patient re-educated about  the importance of commitment to a  minimum of 150 minutes of exercise per week as able.  The importance of healthy food choices with portion control discussed, as well as eating regularly and within a 12 hour window most days. The need to choose "clean , green" food 50 to 75% of the time is discussed, as well as to make water the primary drink and set a goal of 64 ounces water daily.    Weight /BMI 07/13/2020 06/30/2020 05/27/2020  WEIGHT 254 lb 0.6 oz - 270 lb  HEIGHT 5\' 8"  - 5\' 8"   BMI 38.63 kg/m2 - 41.05 kg/m2  Some encounter information is confidential and restricted. Go to Review Flowsheets activity to see all data.  obesity linked with HTN, DM

## 2020-07-14 DIAGNOSIS — R8290 Unspecified abnormal findings in urine: Secondary | ICD-10-CM | POA: Diagnosis not present

## 2020-07-18 ENCOUNTER — Other Ambulatory Visit: Payer: Self-pay | Admitting: Family Medicine

## 2020-07-18 LAB — URINE CULTURE

## 2020-07-18 NOTE — Progress Notes (Signed)
Nitrofurantoin 1000

## 2020-07-26 ENCOUNTER — Other Ambulatory Visit: Payer: Self-pay

## 2020-07-26 MED ORDER — NITROFURANTOIN MONOHYD MACRO 100 MG PO CAPS: 100.0000 mg | ORAL_CAPSULE | Freq: Two times a day (BID) | ORAL | 0 refills | Status: DC

## 2020-07-27 ENCOUNTER — Other Ambulatory Visit: Payer: Self-pay

## 2020-07-27 ENCOUNTER — Encounter: Payer: Self-pay | Admitting: Diagnostic Neuroimaging

## 2020-07-27 ENCOUNTER — Ambulatory Visit: Payer: Medicare HMO | Admitting: Diagnostic Neuroimaging

## 2020-07-27 VITALS — BP 131/61 | HR 66

## 2020-07-27 DIAGNOSIS — F0391 Unspecified dementia with behavioral disturbance: Secondary | ICD-10-CM

## 2020-07-27 DIAGNOSIS — F03B18 Unspecified dementia, moderate, with other behavioral disturbance: Secondary | ICD-10-CM

## 2020-07-27 NOTE — Patient Instructions (Signed)
MEMORY LOSS (mild-moderate dementia; some reports in Epic going back to 2013) - consider memantine 10mg  at bedtime; increase to twice a day after 1-2 weeks - safety / supervision issues reviewed - daily physical activity / exercise (at least 15-30 minutes) - eat more plants / vegetables - increase social activities, brain stimulation, games, puzzles, hobbies, crafts, arts, music - aim for at least 7-8 hours sleep per night (or more) - avoid smoking and alcohol - caregiver resources provided - no driving; cannot handle finances or medications (daughter helping)  SEIZURES (going back to 2013; prior seizures related to xanax withdrawal and now UTI; dementia could be another underlying factor for seizures) - monitor for now; continue DM control; if an uprovoked seizure occurs in future, then consider anti-seizure medication (such as depakote; prior levetiracetam aggravated mood)

## 2020-07-27 NOTE — Progress Notes (Signed)
[facility]  PATIENT: Laura Atkins DOB: Sep 08, 1941  REFERRING CLINICIAN: Elodia Florence.,* HISTORY FROM: patient  REASON FOR VISIT: new consult    HISTORICAL  CHIEF COMPLAINT:  Chief Complaint  Patient presents with  . Seizure-like activity    rm 6 dgtrIvin Booty  . Cognitive deficits    MMSE 20    HISTORY OF PRESENT ILLNESS:   79 year old female here for evaluation of dementia.  Patient was hospitalized recently in September 2021 for seizure, hyperglycemia, UTI and AKI.  MRI showed no acute findings.  EEG showed encephalopathy.  Neurology consult was obtained.  Patient was also noted to have seizure in 2013, likely related to Xanax withdrawal.  Recent seizure was likely related to metabolic encephalopathy and UTI.  Therefore antiseizure medication was not recommended.  Patient also has had some memory problems going back several years.  Review of electronic medical records back to 2013 indicate some possible dementia and memory loss problems at that time.  Symptoms have progressed.  Patient's daughter is now living with her since 2018.  Patient has had progressive short-term memory loss confusion, paranoia, behavior changes, decline in ADLs, physical and cognitive decline over the past 2 to 3 years.  Since recent hospitalization patient has significant decline.  Patient's daughter and other family members are helping with day-to-day functioning.    REVIEW OF SYSTEMS: Full 14 system review of systems performed and negative with exception of: As per HPI.  ALLERGIES: Allergies  Allergen Reactions  . Penicillins Shortness Of Breath, Itching and Rash  . Prednisone Shortness Of Breath, Itching and Rash  . Propoxyphene N-Acetaminophen Itching and Nausea And Vomiting  . Sulfa Antibiotics Itching and Nausea And Vomiting    HOME MEDICATIONS: Outpatient Medications Prior to Visit  Medication Sig Dispense Refill  . Accu-Chek FastClix Lancets MISC 1 each  by Does not apply route 4 (four) times daily. Use to monitor glucose levels 4 times daily; E11.65 306 each 2  . Alcohol Swabs (B-D SINGLE USE SWABS REGULAR) PADS 1 each by Does not apply route 4 (four) times daily. Use to prep site for glucose monitoring 4 times daily; E11.65 300 each 2  . amLODipine (NORVASC) 5 MG tablet Take 1 tablet (5 mg total) by mouth daily. 30 tablet 3  . aspirin 81 MG tablet Take 81 mg by mouth daily.      Marland Kitchen atorvastatin (LIPITOR) 40 MG tablet TAKE 1 TABLET BY MOUTH EVERY DAY 30 tablet 2  . Blood Glucose Monitoring Suppl (ACCU-CHEK GUIDE ME) w/Device KIT 1 each by Does not apply route 4 (four) times daily. Use to monitor glucose levels 4 times daily; E11.65 1 kit 0  . Cholecalciferol 125 MCG (5000 UT) TABS Take 1 tablet by mouth daily.    . Continuous Blood Gluc Sensor (FREESTYLE LIBRE 14 DAY SENSOR) MISC 1 Device by Other route every 14 (fourteen) days. 6 each 3  . diphenhydrAMINE (BENADRYL) 25 MG tablet Take 25 mg by mouth at bedtime.    . donepezil (ARICEPT) 5 MG tablet Take 1 tablet (5 mg total) by mouth at bedtime. 30 tablet 2  . ferrous sulfate 325 (65 FE) MG tablet Take 1 tablet (325 mg total) by mouth daily with breakfast. 30 tablet 5  . gabapentin (NEURONTIN) 400 MG capsule TAKE ONE CAPSULE BY MOUTH EVERY MORNING and TAKE ONE CAPSULE AT NOON and TAKE TWO CAPSULES AT BEDTIME 360 capsule 1  . glucose blood (ACCU-CHEK GUIDE) test strip 1 each by Other  route 4 (four) times daily. 200 strip 12  . insulin NPH-regular Human (NOVOLIN 70/30 RELION) (70-30) 100 UNIT/ML injection 20 units with breakfast, and 10 units with supper. 20 mL 11  . Insulin Syringe-Needle U-100 (INSULIN SYRINGE 1CC/31GX5/16") 31G X 5/16" 1 ML MISC 1 each by Does not apply route daily. Use to inject insulin daily; E11.65 90 each 2  . Multiple Vitamin (MULTIVITAMIN) capsule Take 1 capsule by mouth daily.    . nitrofurantoin (MACRODANTIN) 100 MG capsule Take 100 mg by mouth 4 (four) times daily. Started  on 07/26/20    . nitrofurantoin, macrocrystal-monohydrate, (MACROBID) 100 MG capsule Take 1 capsule (100 mg total) by mouth 2 (two) times daily. 14 capsule 0  . omeprazole (PRILOSEC) 20 MG capsule TAKE ONE CAPSULE BY MOUTH EVERY DAY 30 capsule 2  . potassium chloride SA (KLOR-CON) 20 MEQ tablet TAKE 1 TABLET BY MOUTH TWICE DAILY 180 tablet 1  . sertraline (ZOLOFT) 100 MG tablet Take 1 tablet (100 mg total) by mouth daily. 60 tablet 5  . UNABLE TO FIND Walker x 1  DX unsteady gait, osteoarthritis of left knee 1 each 0  . UNABLE TO FIND Elevated [place] Seat x 1 DX: unsteady gait, back pain, osteoarthritis of left knee 1 each 0  . UNABLE TO FIND Standing upright walker x 1  DX M54.40, M19.90 1 each 0   No facility-administered medications prior to visit.    PAST MEDICAL HISTORY: Past Medical History:  Diagnosis Date  . Alpha thalassemia trait 01/26/2010   02/2012: Nl CBC ex H&H-10.7/34.8, MCV-69   . Anemia   . Anxiety   . Anxiety and depression   . Cellulitis 05/09/2017  . Depression   . Diabetes mellitus   . Foot pain, right 04/30/2013  . GERD (gastroesophageal reflux disease)   . Headache(784.0)   . Hyperlipidemia   . Hypertension   . Iron deficiency 01/21/2017  . Microcytic anemia 01/26/2010   02/2012: Nl CBC ex H&H-10.7/34.8, MCV-69   . NECK PAIN, CHRONIC 10/21/2008   +chronic back pain    . Obesity   . Obstructive sleep apnea   . Osteoarthritis    Left knee; right shoulder; chronic neck and back pain  . Pruritus   . PVD (peripheral vascular disease) ([place]) 01/28/2014  . Seizures ([place])   . Shoulder pain, right 04/14/2015  . Tremor    This started months ago after her seizure progressing to very poor hand writing  . Urinary incontinence   . UTI (urinary tract infection) 01/18/2013    PAST SURGICAL HISTORY: Past Surgical History:  Procedure Laterality Date  . ABDOMINAL HYSTERECTOMY    . BREAST EXCISIONAL BIOPSY     Left; cyst  . CATARACT EXTRACTION Right    12/2017  . CATARACT  EXTRACTION W/ INTRAOCULAR LENS IMPLANT Left 09/07/2013  . CHOLECYSTECTOMY    . COLONOSCOPY    . COLONOSCOPY N/A 07/20/2015   Procedure: COLONOSCOPY;  Surgeon: Rogene Houston, MD;  Location: [facility];  Service: Endoscopy;  Laterality: N/A;  930  . EYE SURGERY Left 09/07/2013   cataract    FAMILY HISTORY: Family History  Problem Relation Age of Onset  . Lung cancer Mother 34  . Kidney disease Father   . Diabetes Sister   . Keloids Brother   . ADD / ADHD Grandchild   . Bipolar disorder Grandchild   . Bipolar disorder Daughter   . Seizures Daughter   . Heart disease Daughter   . Kidney disease Son   .  Neuropathy Son   . Kidney disease Son   . Edema Daughter   . Breast cancer Daughter 43  . Allergies Daughter   . Alcohol abuse Neg Hx   . Drug abuse Neg Hx     SOCIAL HISTORY: Social History   Socioeconomic History  . Marital status: Married    Spouse name: saunders  . Number of children: 5  . Years of education: 24  . Highest education level: Not on file  Occupational History  . Occupation: Disabled     Employer: RETIRED  Tobacco Use  . Smoking status: Never Smoker  . Smokeless tobacco: Never Used  Vaping Use  . Vaping Use: Never used  Substance and Sexual Activity  . Alcohol use: No    Alcohol/week: 0.0 standard drinks  . Drug use: No  . Sexual activity: Yes    Birth control/protection: Surgical  Other Topics Concern  . Not on file  Social History Narrative   07/27/20 Patient lives at home with her husband [name] and dgtrIvin Booty.  Patient is retired.    Right handed.    Five Children.    Caffeine- 2 daily   Social Determinants of Health   Financial Resource Strain: Low Risk   . Difficulty of Paying Living Expenses: Not very hard  Food Insecurity: No Food Insecurity  . Worried About Charity fundraiser in the Last Year: Never true  . Ran Out of Food in the Last Year: Never true  Transportation Needs: No Transportation Needs  . Lack of  Transportation (Medical): No  . Lack of Transportation (Non-Medical): No  Physical Activity: Inactive  . Days of Exercise per Week: 0 days  . Minutes of Exercise per Session: 0 min  Stress: No Stress Concern Present  . Feeling of Stress : Only a little  Social Connections: Moderately Integrated  . Frequency of Communication with Friends and Family: Three times a week  . Frequency of Social Gatherings with Friends and Family: Three times a week  . Attends Religious Services: More than 4 times per year  . Active Member of Clubs or Organizations: No  . Attends Archivist Meetings: Never  . Marital Status: Married  Human resources officer Violence:   . Fear of Current or Ex-Partner: Not on file  . Emotionally Abused: Not on file  . Physically Abused: Not on file  . Sexually Abused: Not on file     PHYSICAL EXAM  GENERAL EXAM/CONSTITUTIONAL: Vitals:  Vitals:   07/27/20 1352  BP: 131/61  Pulse: 66     There is no height or weight on file to calculate BMI. Wt Readings from Last 3 Encounters:  07/13/20 254 lb 0.6 oz (115.2 kg)  05/27/20 270 lb (122.5 kg)  05/23/20 270 lb (122.5 kg)     Patient is in no distress; well developed, nourished and groomed; neck is supple  CARDIOVASCULAR:  Examination of carotid arteries is normal; no carotid bruits  Regular rate and rhythm, no murmurs  Examination of peripheral vascular system by observation and palpation is normal  EYES:  Ophthalmoscopic exam of optic discs and posterior segments is normal; no papilledema or hemorrhages  No exam data present  MUSCULOSKELETAL:  Gait, strength, tone, movements noted in Neurologic exam below  NEUROLOGIC: MENTAL STATUS:  MMSE - [place] Exam 07/27/2020 07/13/2020  Orientation to time 3 4  Orientation to Place 4 5  Registration 3 3  Attention/ Calculation 2 5  Recall 0 0  Language- name 2  objects 2 2  Language- repeat 0 1  Language- follow 3 step command 3 2  Language-  read & follow direction 1 1  Write a sentence 1 0  Copy design 1 0  Copy design-comments hand shakes -  Total score 20 23    awake, alert, oriented to person  [place] attention and concentration  language fluent, comprehension intact, naming intact  fund of knowledge appropriate  CRANIAL NERVE:   2nd - no papilledema on fundoscopic exam  2nd, 3rd, 4th, 6th - pupils equal and reactive to light, visual fields full to confrontation, extraocular muscles intact, no nystagmus  5th - facial sensation symmetric  7th - facial strength symmetric  8th - hearing intact  9th - palate elevates symmetrically, uvula midline  11th - shoulder shrug symmetric  12th - tongue protrusion midline  MOTOR:   normal bulk and tone, full strength in the BUE, BLE  SENSORY:   normal and symmetric to light touch; DECR IN FEET  COORDINATION:   finger-nose-finger, fine finger movements normal  REFLEXES:   deep tendon reflexes TRACE and symmetric  GAIT/STATION:   IN WHEEL CHAIR     DIAGNOSTIC DATA (LABS, IMAGING, TESTING) - I reviewed patient records, labs, notes, testing and imaging myself where available.  Lab Results  Component Value Date   WBC 6.3 06/03/2020   HGB 9.6 (L) 06/03/2020   HCT 33.7 (L) 06/03/2020   MCV 69.3 (L) 06/03/2020   PLT 170 06/03/2020      Component Value Date/Time   NA 138 06/03/2020 0210   K 3.7 06/03/2020 0210   CL 101 06/03/2020 0210   CO2 26 06/03/2020 0210   GLUCOSE 145 (H) 06/03/2020 0210   BUN 19 06/03/2020 0210   CREATININE 1.13 (H) 06/03/2020 0210   CREATININE 1.30 (H) 12/01/2019 1137   CALCIUM 9.5 06/03/2020 0210   PROT 7.1 06/03/2020 0210   ALBUMIN 3.6 06/03/2020 0210   AST 35 06/03/2020 0210   ALT 38 06/03/2020 0210   ALKPHOS 76 06/03/2020 0210   BILITOT 0.5 06/03/2020 0210   GFRNONAA 47 (L) 06/03/2020 0210   GFRNONAA 40 (L) 12/01/2019 1137   GFRAA 55 (L) 06/03/2020 0210   GFRAA 46 (L) 12/01/2019 1137   Lab  Results  Component Value Date   CHOL 149 12/01/2019   HDL 58 12/01/2019   LDLCALC 72 12/01/2019   TRIG 100 12/01/2019   CHOLHDL 2.6 12/01/2019   Lab Results  Component Value Date   HGBA1C 8.3 (A) 06/30/2020   Lab Results  Component Value Date   WUXLKGMW10 272 05/27/2020   Lab Results  Component Value Date   TSH 1.129 05/27/2020     05/05/12 EEG - Clinical Interpretation:  This routine EEG done with the patient awake and drowsy is abnormal.  Background activities were slightly too slow suggesting a mild encephalopathy of non-specific etiology.  However, the right hemisphere did seem to be slower than the left suggesting a structural or functional abnormality in that location.   05/29/20 EEG This study is suggestive of mild diffuse encephalopathy, nonspecific etiology. No seizures or epileptiform discharges were seen throughout the recording.  05/27/20 MRI brain  - No acute infarction, hemorrhage, or mass. Moderate chronic microvascular ischemic changes.     ASSESSMENT AND PLAN  79 y.o. year old female here with:  Dx:  1. Moderate dementia with behavioral disturbance (HCC)      PLAN:  MEMORY LOSS (mild-moderate dementia; some reports in Epic going  back to 2013) - consider memantine 28m at bedtime; increase to twice a day after 1-2 weeks - safety / supervision issues reviewed - daily physical activity / exercise (at least 15-30 minutes) - eat more plants / vegetables - increase social activities, brain stimulation, games, puzzles, hobbies, crafts, arts, music - aim for at least 7-8 hours sleep per night (or more) - avoid smoking and alcohol - caregiver resources provided - no driving; cannot handle finances or medications (daughter helping)  SEIZURES (going back to 2013; prior seizures related to xanax withdrawal and now UTI / AKI; dementia could be another underlying factor for seizures) - monitor for now; continue DM control; if an uprovoked seizure occurs in  future, then consider anti-seizure medication (such as depakote; prior levetiracetam aggravated mood)  GAIT DIFFICULTY - due to diabetic neuropathy, dementia, deconditioning, obesity - supportive care  Return for pending if symptoms worsen or fail to improve, return to PCP.    VPenni Bombard MD 193/12/682 20:33PM Certified in Neurology, Neurophysiology and Neuroimaging  [facility] [address] [place] [phone]

## 2020-08-05 ENCOUNTER — Ambulatory Visit: Payer: Medicare HMO | Admitting: Podiatry

## 2020-08-05 ENCOUNTER — Encounter: Payer: Self-pay | Admitting: Podiatry

## 2020-08-05 ENCOUNTER — Other Ambulatory Visit: Payer: Self-pay

## 2020-08-05 DIAGNOSIS — E0843 Diabetes mellitus due to underlying condition with diabetic autonomic (poly)neuropathy: Secondary | ICD-10-CM

## 2020-08-05 DIAGNOSIS — M79676 Pain in unspecified toe(s): Secondary | ICD-10-CM | POA: Diagnosis not present

## 2020-08-05 DIAGNOSIS — L89610 Pressure ulcer of right heel, unstageable: Secondary | ICD-10-CM | POA: Diagnosis not present

## 2020-08-05 DIAGNOSIS — B351 Tinea unguium: Secondary | ICD-10-CM

## 2020-08-05 MED ORDER — DOXYCYCLINE HYCLATE 100 MG PO CAPS: 100.0000 mg | ORAL_CAPSULE | Freq: Two times a day (BID) | ORAL | 0 refills | Status: AC

## 2020-08-05 NOTE — Patient Instructions (Signed)
Wear heel protectors whenever in bed and in recliner. Elevate feet when at rest to minimize edema.  Pressure Injury  A pressure injury is damage to the skin and underlying tissue that results from pressure being applied to an area of the body. It often affects people who must spend a long time in a bed or chair because of a medical condition. Pressure injuries usually occur:  Over bony parts of the body, such as the tailbone, shoulders, elbows, hips, heels, spine, ankles, and back of the head.  Under medical devices that make contact with the body, such as respiratory equipment, stockings, tubes, and splints. Pressure injuries start as reddened areas on the skin and can lead to pain and an open wound. What are the causes? This condition is caused by frequent or constant pressure to an area of the body. Decreased blood flow to the skin can eventually cause the skin tissue to die and break down, causing a wound. What increases the risk? You are more likely to develop this condition if you:  Are in the hospital or an extended care facility.  Are bedridden or in a wheelchair.  Have an injury or disease that keeps you from: ? Moving normally. ? Feeling pain or pressure.  Have a condition that: ? Makes you sleepy or less alert. ? Causes poor blood flow.  Need to wear a medical device.  Have poor control of your bladder or bowel functions (incontinence).  Have poor nutrition (malnutrition). If you are at risk for pressure injuries, your health care provider may recommend certain types of mattresses, mattress covers, pillows, cushions, or boots to help prevent them. These may include products filled with air, foam, gel, or sand. What are the signs or symptoms? Symptoms of this condition depend on the severity of the injury. Symptoms may include:  Red or dark areas of the skin.  Pain, warmth, or a change of skin texture.  Blisters.  An open wound. How is this diagnosed? This  condition is diagnosed with a medical history and physical exam. You may also have tests, such as:  Blood tests.  Imaging tests.  Blood flow tests. Your pressure injury will be staged based on its severity. Staging is based on:  The depth of the tissue injury, including whether there is exposure of muscle, bone, or tendon.  The cause of the pressure injury. How is this treated? This condition may be treated by:  Relieving or redistributing pressure on your skin. This includes: ? Frequently changing your position. ? Avoiding positions that caused the wound or that can make the wound worse. ? Using specific bed mattresses, chair cushions, or protective boots. ? Moving medical devices from an area of pressure, or placing padding between the skin and the device. ? Using foams, creams, or powders to prevent rubbing (friction) on the skin.  Keeping your skin clean and dry. This may include using a skin cleanser or skin barrier as told by your health care provider.  Cleaning your injury and removing any dead tissue from the wound (debridement).  Placing a bandage (dressing) over your injury.  Using medicines for pain or to prevent or treat infection. Surgery may be needed if other treatments are not working or if your injury is very deep. Follow these instructions at home: Wound care  Follow instructions from your health care provider about how to take care of your wound. Make sure you: ? Wash your hands with soap and water before and after you change your  bandage (dressing). If soap and water are not available, use hand sanitizer. ? Change your dressing as told by your health care provider.  Check your wound every day for signs of infection. Have a caregiver do this for you if you are not able. Check for: ? Redness, swelling, or increased pain. ? More fluid or blood. ? Warmth. ? Pus or a bad smell. Skin care  Keep your skin clean and dry. Gently pat your skin dry.  Do not rub  or massage your skin.  You or a caregiver should check your skin every day for any changes in color or any new blisters or sores (ulcers). Medicines  Take over-the-counter and prescription medicines only as told by your health care provider.  If you were prescribed an antibiotic medicine, take or apply it as told by your health care provider. Do not stop using the antibiotic even if your condition improves. Reducing and redistributing pressure  Do not lie or sit in one position for a long time. Move or change position every 1-2 hours, or as told by your health care provider.  Use pillows or cushions to reduce pressure. Ask your health care provider to recommend cushions or pads for you. General instructions   Eat a healthy diet that includes lots of protein.  Drink enough fluid to keep your urine pale yellow.  Be as active as you can every day. Ask your health care provider to suggest safe exercises or activities.  Do not abuse drugs or alcohol.  Do not use any products that contain nicotine or tobacco, such as cigarettes, e-cigarettes, and chewing tobacco. If you need help quitting, ask your health care provider.  Keep all follow-up visits as told by your health care provider. This is important. Contact a health care provider if:  You have: ? A fever or chills. ? Pain that is not helped by medicine. ? Any changes in skin color. ? New blisters or sores. ? Pus or a bad smell coming from your wound. ? Redness, swelling, or pain around your wound. ? More fluid or blood coming from your wound.  Your wound does not improve after 1-2 weeks of treatment. Summary  A pressure injury is damage to the skin and underlying tissue that results from pressure being applied to an area of the body.  Do not lie or sit in one position for a long time. Your health care provider may advise you to move or change position every 1-2 hours.  Follow instructions from your health care provider about  how to take care of your wound.  Keep all follow-up visits as told by your health care provider. This is important. This information is not intended to replace advice given to you by your health care provider. Make sure you discuss any questions you have with your health care provider. Document Revised: 04/09/2018 Document Reviewed: 04/09/2018 Elsevier Patient Education  [place].

## 2020-08-06 NOTE — Progress Notes (Addendum)
Subjective: Laura Atkins presents today follow up for at risk foot care with history of diabetic neuropathy and painful mycotic nails b/l that are difficult to trim. Pain is aggravated when wearing enclosed shoe gear and relieved with periodic professional debridement.  She continues to c/o right heel pain and states she has not been wearing her heel protectors. Her daughter, [name], is present during today's visit. Daughter states Laura Atkins has not been using heel protectors as instructed and does not elevate her feet. Daughter also states Laura Atkins is not following her diet and eating things such as cookies.  Daughter also states they have been unsuccessful in attempting to get [employer] to come in due to insurance. Daughter also relates she completed a course of antibiotics for recurrent UTI.   Fayrene Helper, MD is patient's PCP. Last visit was: 07/13/2020.  Past Medical History:  Diagnosis Date  . Alpha thalassemia trait 01/26/2010   02/2012: Nl CBC ex H&H-10.7/34.8, MCV-69   . Anemia   . Anxiety   . Anxiety and depression   . Cellulitis 05/09/2017  . Depression   . Diabetes mellitus   . Foot pain, right 04/30/2013  . GERD (gastroesophageal reflux disease)   . Headache(784.0)   . Hyperlipidemia   . Hypertension   . Iron deficiency 01/21/2017  . Microcytic anemia 01/26/2010   02/2012: Nl CBC ex H&H-10.7/34.8, MCV-69   . NECK PAIN, CHRONIC 10/21/2008   +chronic back pain    . Obesity   . Obstructive sleep apnea   . Osteoarthritis    Left knee; right shoulder; chronic neck and back pain  . Pruritus   . PVD (peripheral vascular disease) ([place]) 01/28/2014  . Seizures ([place])   . Shoulder pain, right 04/14/2015  . Tremor    This started months ago after her seizure progressing to very poor hand writing  . Urinary incontinence   . UTI (urinary tract infection) 01/18/2013     Current Outpatient Medications on File Prior to Visit  Medication Sig Dispense Refill  . Accu-Chek  FastClix Lancets MISC 1 each by Does not apply route 4 (four) times daily. Use to monitor glucose levels 4 times daily; E11.65 306 each 2  . Alcohol Swabs (B-D SINGLE USE SWABS REGULAR) PADS 1 each by Does not apply route 4 (four) times daily. Use to prep site for glucose monitoring 4 times daily; E11.65 300 each 2  . amLODipine (NORVASC) 5 MG tablet Take 1 tablet (5 mg total) by mouth daily. 30 tablet 3  . aspirin 81 MG tablet Take 81 mg by mouth daily.      Marland Kitchen atorvastatin (LIPITOR) 40 MG tablet TAKE 1 TABLET BY MOUTH EVERY DAY 30 tablet 2  . Blood Glucose Monitoring Suppl (ACCU-CHEK GUIDE ME) w/Device KIT 1 each by Does not apply route 4 (four) times daily. Use to monitor glucose levels 4 times daily; E11.65 1 kit 0  . Cholecalciferol 125 MCG (5000 UT) TABS Take 1 tablet by mouth daily.    . Continuous Blood Gluc Sensor (FREESTYLE LIBRE 14 DAY SENSOR) MISC 1 Device by Other route every 14 (fourteen) days. 6 each 3  . diphenhydrAMINE (BENADRYL) 25 MG tablet Take 25 mg by mouth at bedtime.    . donepezil (ARICEPT) 5 MG tablet Take 1 tablet (5 mg total) by mouth at bedtime. 30 tablet 2  . ferrous sulfate 325 (65 FE) MG tablet Take 1 tablet (325 mg total) by mouth daily with breakfast. 30 tablet 5  .  gabapentin (NEURONTIN) 400 MG capsule TAKE ONE CAPSULE BY MOUTH EVERY MORNING and TAKE ONE CAPSULE AT NOON and TAKE TWO CAPSULES AT BEDTIME 360 capsule 1  . glucose blood (ACCU-CHEK GUIDE) test strip 1 each by Other route 4 (four) times daily. 200 strip 12  . insulin NPH-regular Human (NOVOLIN 70/30 RELION) (70-30) 100 UNIT/ML injection 20 units with breakfast, and 10 units with supper. 20 mL 11  . Insulin Syringe-Needle U-100 (INSULIN SYRINGE 1CC/31GX5/16") 31G X 5/16" 1 ML MISC 1 each by Does not apply route daily. Use to inject insulin daily; E11.65 90 each 2  . Multiple Vitamin (MULTIVITAMIN) capsule Take 1 capsule by mouth daily.    . nitrofurantoin (MACRODANTIN) 100 MG capsule Take 100 mg by mouth 4  (four) times daily. Started on 07/26/20    . nitrofurantoin, macrocrystal-monohydrate, (MACROBID) 100 MG capsule Take 1 capsule (100 mg total) by mouth 2 (two) times daily. 14 capsule 0  . omeprazole (PRILOSEC) 20 MG capsule TAKE ONE CAPSULE BY MOUTH EVERY DAY 30 capsule 2  . potassium chloride SA (KLOR-CON) 20 MEQ tablet TAKE 1 TABLET BY MOUTH TWICE DAILY 180 tablet 1  . sertraline (ZOLOFT) 100 MG tablet Take 1 tablet (100 mg total) by mouth daily. 60 tablet 5  . UNABLE TO FIND Walker x 1  DX unsteady gait, osteoarthritis of left knee 1 each 0  . UNABLE TO FIND Elevated Toliet Seat x 1 DX: unsteady gait, back pain, osteoarthritis of left knee 1 each 0  . UNABLE TO FIND Standing upright walker x 1  DX M54.40, M19.90 1 each 0   No current facility-administered medications on file prior to visit.     Allergies  Allergen Reactions  . Penicillins Shortness Of Breath, Itching and Rash  . Prednisone Shortness Of Breath, Itching and Rash  . Propoxyphene N-Acetaminophen Itching and Nausea And Vomiting  . Sulfa Antibiotics Itching and Nausea And Vomiting    Objective: Laura Atkins is a pleasant 79 y.o. African American female in NAD. Morbidly obese. AAO x 3.  There were no vitals filed for this visit.  Vascular Examination: Capillary fill time to digits <3 seconds b/l. Faintly palpable DP pulses b/l. Nonpalpable PT pulses b/l. Pedal hair absent b/l Skin temperature gradient within normal limits b/l. Evidence of chronic venous insufficiency b/l LE. Noted improvement in LE edema b/l LE.  Dermatological Examination: Pedal skin well moisturized.   No open wounds bilaterally. No interdigital macerations bilaterally. Toenails 1-5 b/l elongated, dystrophic, thickened, crumbly with subungual debris and tenderness to dorsal palpation.   Skin discoloration consistent with findings of of chronic venous insufficiency b/l LE: hyperpigmentation.  She has healed scarring on anterior aspect of right  ankle.         Posteromedial  right heel with tenderness to palpation.  No edema, no drainage, no blister formation. There is peeling skin. There is slight warmth to area.   Musculoskeletal: Normal muscle strength 5/5 to all lower extremity muscle groups bilaterally. Utilizes wheelchair for mobility assistance.  Neurological Examination: Pt has subjective symptoms of neuropathy. Protective sensation intact 5/5 intact bilaterally with 10g monofilament b/l. Vibratory sensation intact b/l. Proprioception intact bilaterally.   LOWER EXTREMITY DOPPLER STUDY 03/10/2019  Indications: Peripheral vascular disease.   High Risk Factors: Hypertension, hyperlipidemia, Diabetes, no history of  smoking.   Performing Technologist: Burley Saver RVT     Examination Guidelines: A complete evaluation includes at minimum, Doppler  waveform signals and systolic blood pressure reading at the level of  bilateral  brachial, anterior tibial, and posterior tibial arteries, when vessel  segments  are accessible. Bilateral testing is considered an integral part of a  complete  examination. Photoelectric Plethysmograph (PPG) waveforms and toe systolic  pressure readings are included as required and additional duplex testing  as  needed. Limited examinations for reoccurring indications may be performed  as  noted.     ABI Findings:  +---------+------------------+-----+---------+--------+  Right  Rt Pressure (mmHg)IndexWaveform Comment   +---------+------------------+-----+---------+--------+  Brachial 146                      +---------+------------------+-----+---------+--------+  PTA   255        1.75 triphasic      +---------+------------------+-----+---------+--------+  DP    255        1.75 triphasic      +---------+------------------+-----+---------+--------+  Great Toe122        0.84              +---------+------------------+-----+---------+--------+   +---------+------------------+-----+---------+-------+  Left   Lt Pressure (mmHg)IndexWaveform Comment  +---------+------------------+-----+---------+-------+  Brachial 146                      +---------+------------------+-----+---------+-------+  PTA   235        1.61 biphasic       +---------+------------------+-----+---------+-------+  DP    255        1.75 triphasic      +---------+------------------+-----+---------+-------+  Great Toe131        0.90            +---------+------------------+-----+---------+-------+   +-------+-----------+-----------+------------+------------+  ABI/TBIToday's ABIToday's TBIPrevious ABIPrevious TBI  +-------+-----------+-----------+------------+------------+  Right [place]     0.84                  +-------+-----------+-----------+------------+------------+  Left  [place]     0.90                  +-------+-----------+-----------+------------+------------+   No previous study available for comparison.    Summary:  Right: Resting right ankle-brachial index indicates noncompressible right  lower extremity arteries.The right toe-brachial index is normal. RT great  toe pressure = 122 mmHg.   Triphasic waveforms.  Left: Resting left ankle-brachial index indicates noncompressible left  lower extremity arteries.The left toe-brachial index is normal. LT Great  toe pressure = 131 mmHg.   Triphasic waveforms.    *See table(s) above for measurements and observations.      Electronically signed by Monica Martinez MD on 03/10/2019 at 3:12:24 PM.   Hemoglobin A1C Latest Ref Rng & Units 06/30/2020 05/27/2020 05/09/2020 11/06/2019  HGBA1C 4.0 - 5.6 % 8.3(A) 9.5(H) 9.3(A) 9.8(A)  Some recent data might be hidden    Assessment: 1. Pain due to onychomycosis  of toenail   2. Pressure injury of right heel, unstageable ([place])   3. Diabetes mellitus due to underlying condition with diabetic autonomic neuropathy, unspecified whether long term insulin use ([place])    Plan: -Examined patient. -I have stressed the importance of her wearing the heel protectors. Unfortunately, I believe her dementia may be playing a role in compliance. Encouraged her to wear her heel protectors when in bed or in recliner.Daughter related understanding. -Last ABIs completed June, 2020.  -Rx for doxycycline 100 mg po bid x one week. I will have her follow up in one week with Dr. Amalia Hailey. -Toenails 1-5 b/l were debrided in length and girth with sterile nail nippers and dremel without iatrogenic bleeding.  -  Patient/POA to continue soft, supportive shoe gear daily. -Patient/POA to report any pedal injuries to medical professional immediately. -Elevate feet when at rest. -Patient/POA to call should there be question/concern in the interim.  Return in about 3 months (around 11/05/2020) for diabetic nail trim and 1 week follow up with Dr. Amalia Hailey for right heel deep tissue injury.  Marzetta Board, DPM

## 2020-08-10 ENCOUNTER — Ambulatory Visit: Payer: Medicare HMO | Admitting: Endocrinology

## 2020-08-10 ENCOUNTER — Other Ambulatory Visit: Payer: Self-pay

## 2020-08-10 ENCOUNTER — Encounter: Payer: Self-pay | Admitting: Endocrinology

## 2020-08-10 VITALS — BP 152/76 | HR 60 | Ht 68.0 in | Wt 257.0 lb

## 2020-08-10 DIAGNOSIS — Z794 Long term (current) use of insulin: Secondary | ICD-10-CM | POA: Diagnosis not present

## 2020-08-10 DIAGNOSIS — E1165 Type 2 diabetes mellitus with hyperglycemia: Secondary | ICD-10-CM

## 2020-08-10 MED ORDER — NOVOLIN 70/30 RELION (70-30) 100 UNIT/ML ~~LOC~~ SUSP
SUBCUTANEOUS | 11 refills | Status: DC
Start: 2020-08-10 — End: 2020-11-17

## 2020-08-10 NOTE — Progress Notes (Signed)
Subjective:    Patient ID: Laura Atkins, female    DOB: 05-Oct-1940, 79 y.o.   MRN: [id number]  HPI Pt returns for f/u of diabetes mellitus: DM type: insulin-requiring type 2.   Dx'ed: 7290 Complications: polyneuropathy, PAD, and foot ulcer.   Therapy: insulin since 2005.   GDM: never.  DKA: never Severe hypoglycemia: last episode was 2020.    Pancreatitis: never.   SDOH: She takes human insulin, due to cost.   Other: in early 2016, she changed to BID premixed insulin, due to poor results with multiple daily injections; she uses FL continuous glucose monitor Interval history: I reviewed continuous glucose monitor data.  Glucose varies from 68-240.  There is little trend throughout the day.  However, pt says it is lowest when a meal is missed or delayed.  Past Medical History:  Diagnosis Date  . Alpha thalassemia trait 01/26/2010   02/2012: Nl CBC ex H&H-10.7/34.8, MCV-69   . Anemia   . Anxiety   . Anxiety and depression   . Cellulitis 05/09/2017  . Depression   . Diabetes mellitus   . Foot pain, right 04/30/2013  . GERD (gastroesophageal reflux disease)   . Headache(784.0)   . Hyperlipidemia   . Hypertension   . Iron deficiency 01/21/2017  . Microcytic anemia 01/26/2010   02/2012: Nl CBC ex H&H-10.7/34.8, MCV-69   . NECK PAIN, CHRONIC 10/21/2008   +chronic back pain    . Obesity   . Obstructive sleep apnea   . Osteoarthritis    Left knee; right shoulder; chronic neck and back pain  . Pruritus   . PVD (peripheral vascular disease) ([place]) 01/28/2014  . Seizures ([place])   . Shoulder pain, right 04/14/2015  . Tremor    This started months ago after her seizure progressing to very poor hand writing  . Urinary incontinence   . UTI (urinary tract infection) 01/18/2013    Past Surgical History:  Procedure Laterality Date  . ABDOMINAL HYSTERECTOMY    . BREAST EXCISIONAL BIOPSY     Left; cyst  . CATARACT EXTRACTION Right    12/2017  . CATARACT EXTRACTION W/ INTRAOCULAR LENS IMPLANT  Left 09/07/2013  . CHOLECYSTECTOMY    . COLONOSCOPY    . COLONOSCOPY N/A 07/20/2015   Procedure: COLONOSCOPY;  Surgeon: Rogene Houston, MD;  Location: [facility];  Service: Endoscopy;  Laterality: N/A;  930  . EYE SURGERY Left 09/07/2013   cataract    Social History   Socioeconomic History  . Marital status: Married    Spouse name: saunders  . Number of children: 5  . Years of education: 27  . Highest education level: Not on file  Occupational History  . Occupation: Disabled     Employer: RETIRED  Tobacco Use  . Smoking status: Never Smoker  . Smokeless tobacco: Never Used  Vaping Use  . Vaping Use: Never used  Substance and Sexual Activity  . Alcohol use: No    Alcohol/week: 0.0 standard drinks  . Drug use: No  . Sexual activity: Yes    Birth control/protection: Surgical  Other Topics Concern  . Not on file  Social History Narrative   07/27/20 Patient lives at home with her husband [name] and dgtrIvin Booty.  Patient is retired.    Right handed.    Five Children.    Caffeine- 2 daily   Social Determinants of Health   Financial Resource Strain: Low Risk   . Difficulty of Paying Living Expenses: Not very  hard  Food Insecurity: No Food Insecurity  . Worried About Charity fundraiser in the Last Year: Never true  . Ran Out of Food in the Last Year: Never true  Transportation Needs: No Transportation Needs  . Lack of Transportation (Medical): No  . Lack of Transportation (Non-Medical): No  Physical Activity: Inactive  . Days of Exercise per Week: 0 days  . Minutes of Exercise per Session: 0 min  Stress: No Stress Concern Present  . Feeling of Stress : Only a little  Social Connections: Moderately Integrated  . Frequency of Communication with Friends and Family: Three times a week  . Frequency of Social Gatherings with Friends and Family: Three times a week  . Attends Religious Services: More than 4 times per year  . Active Member of Clubs or Organizations: No   . Attends Archivist Meetings: Never  . Marital Status: Married  Human resources officer Violence:   . Fear of Current or Ex-Partner: Not on file  . Emotionally Abused: Not on file  . Physically Abused: Not on file  . Sexually Abused: Not on file    Current Outpatient Medications on File Prior to Visit  Medication Sig Dispense Refill  . Accu-Chek FastClix Lancets MISC 1 each by Does not apply route 4 (four) times daily. Use to monitor glucose levels 4 times daily; E11.65 306 each 2  . Alcohol Swabs (B-D SINGLE USE SWABS REGULAR) PADS 1 each by Does not apply route 4 (four) times daily. Use to prep site for glucose monitoring 4 times daily; E11.65 300 each 2  . amLODipine (NORVASC) 5 MG tablet Take 1 tablet (5 mg total) by mouth daily. 30 tablet 3  . aspirin 81 MG tablet Take 81 mg by mouth daily.      Marland Kitchen atorvastatin (LIPITOR) 40 MG tablet TAKE 1 TABLET BY MOUTH EVERY DAY 30 tablet 2  . Blood Glucose Monitoring Suppl (ACCU-CHEK GUIDE ME) w/Device KIT 1 each by Does not apply route 4 (four) times daily. Use to monitor glucose levels 4 times daily; E11.65 1 kit 0  . Cholecalciferol 125 MCG (5000 UT) TABS Take 1 tablet by mouth daily.    . Continuous Blood Gluc Sensor (FREESTYLE LIBRE 14 DAY SENSOR) MISC 1 Device by Other route every 14 (fourteen) days. 6 each 3  . diphenhydrAMINE (BENADRYL) 25 MG tablet Take 25 mg by mouth at bedtime.    . donepezil (ARICEPT) 5 MG tablet Take 1 tablet (5 mg total) by mouth at bedtime. 30 tablet 2  . ferrous sulfate 325 (65 FE) MG tablet Take 1 tablet (325 mg total) by mouth daily with breakfast. 30 tablet 5  . gabapentin (NEURONTIN) 400 MG capsule TAKE ONE CAPSULE BY MOUTH EVERY MORNING and TAKE ONE CAPSULE AT NOON and TAKE TWO CAPSULES AT BEDTIME 360 capsule 1  . glucose blood (ACCU-CHEK GUIDE) test strip 1 each by Other route 4 (four) times daily. 200 strip 12  . Insulin Syringe-Needle U-100 (INSULIN SYRINGE 1CC/31GX5/16") 31G X 5/16" 1 ML MISC 1 each by  Does not apply route daily. Use to inject insulin daily; E11.65 90 each 2  . Multiple Vitamin (MULTIVITAMIN) capsule Take 1 capsule by mouth daily.    . nitrofurantoin (MACRODANTIN) 100 MG capsule Take 100 mg by mouth 4 (four) times daily. Started on 07/26/20    . nitrofurantoin, macrocrystal-monohydrate, (MACROBID) 100 MG capsule Take 1 capsule (100 mg total) by mouth 2 (two) times daily. 14 capsule 0  . omeprazole (PRILOSEC)  20 MG capsule TAKE ONE CAPSULE BY MOUTH EVERY DAY 30 capsule 2  . potassium chloride SA (KLOR-CON) 20 MEQ tablet TAKE 1 TABLET BY MOUTH TWICE DAILY 180 tablet 1  . sertraline (ZOLOFT) 100 MG tablet Take 1 tablet (100 mg total) by mouth daily. 60 tablet 5  . UNABLE TO FIND Walker x 1  DX unsteady gait, osteoarthritis of left knee 1 each 0  . UNABLE TO FIND Elevated Toliet Seat x 1 DX: unsteady gait, back pain, osteoarthritis of left knee 1 each 0  . UNABLE TO FIND Standing upright walker x 1  DX M54.40, M19.90 1 each 0   No current facility-administered medications on file prior to visit.    Allergies  Allergen Reactions  . Penicillins Shortness Of Breath, Itching and Rash  . Prednisone Shortness Of Breath, Itching and Rash  . Propoxyphene N-Acetaminophen Itching and Nausea And Vomiting  . Sulfa Antibiotics Itching and Nausea And Vomiting    Family History  Problem Relation Age of Onset  . Lung cancer Mother 60  . Kidney disease Father   . Diabetes Sister   . Keloids Brother   . ADD / ADHD Grandchild   . Bipolar disorder Grandchild   . Bipolar disorder Daughter   . Seizures Daughter   . Heart disease Daughter   . Kidney disease Son   . Neuropathy Son   . Kidney disease Son   . Edema Daughter   . Breast cancer Daughter 3  . Allergies Daughter   . Alcohol abuse Neg Hx   . Drug abuse Neg Hx     BP (!) 152/76   Pulse 60   Ht 5' 8"  (1.727 m)   Wt 257 lb (116.6 kg)   SpO2 96%   BMI 39.08 kg/m    Review of Systems Denies LOC.      Objective:    Physical Exam VITAL SIGNS:  See vs page GENERAL: no distress Pulses: dorsalis pedis absent bilat (poss due to edema).   MSK: no deformity of the feet CV: 2+ bilat bilat leg edema Skin:  no ulcer on the feet, but the skin at the right heel is abraded.  normal temp on the feet.  There is hyperpigmentation of the legs and feet.   Neuro: sensation is intact to touch on the feet, but decreased from normal.   Ext: there is bilateral onychomycosis of the toenails.    Lab Results  Component Value Date   HGBA1C 8.3 (A) 06/30/2020       Assessment & Plan:  HTN: is noted today Insulin-requiring type 2 DM, with PAD: uncontrolled Hypoglycemia, due to insulin: this limits aggressiveness of glycemic control.   Patient Instructions  Your blood pressure is high today.  Please see your primary care provider soon, to have it rechecked check your blood sugar twice a day.  vary the time of day when you check, between before the 3 meals, and at bedtime.  also check if you have symptoms of your blood sugar being too high or too low.  please keep a record of the readings and bring it to your next appointment here (or you can bring the meter itself).  You can write it on any piece of paper.  please call us sooner if your blood sugar goes below 70, or if you have a lot of readings over 200. Please increase the insulin to 25 units with breakfast, and 10 units with supper.  On this type of insulin schedule, you  should eat meals on a regular schedule.  If a meal is missed or significantly delayed, your blood sugar could go low.   Please come back for a follow-up appointment in 2 months.

## 2020-08-10 NOTE — Patient Instructions (Addendum)
Your blood pressure is high today.  Please see your primary care provider soon, to have it rechecked check your blood sugar twice a day.  vary the time of day when you check, between before the 3 meals, and at bedtime.  also check if you have symptoms of your blood sugar being too high or too low.  please keep a record of the readings and bring it to your next appointment here (or you can bring the meter itself).  You can write it on any piece of paper.  please call us sooner if your blood sugar goes below 70, or if you have a lot of readings over 200. Please increase the insulin to 25 units with breakfast, and 10 units with supper.  On this type of insulin schedule, you should eat meals on a regular schedule.  If a meal is missed or significantly delayed, your blood sugar could go low.   Please come back for a follow-up appointment in 2 months.

## 2020-08-15 ENCOUNTER — Other Ambulatory Visit: Payer: Self-pay

## 2020-08-15 ENCOUNTER — Ambulatory Visit: Payer: Medicare HMO | Admitting: Podiatry

## 2020-08-15 DIAGNOSIS — I872 Venous insufficiency (chronic) (peripheral): Secondary | ICD-10-CM

## 2020-08-15 DIAGNOSIS — E0843 Diabetes mellitus due to underlying condition with diabetic autonomic (poly)neuropathy: Secondary | ICD-10-CM | POA: Diagnosis not present

## 2020-08-15 DIAGNOSIS — L84 Corns and callosities: Secondary | ICD-10-CM

## 2020-08-15 NOTE — Progress Notes (Signed)
   Subjective:  79 y.o. female presenting today for routine diabetic foot exam.  She states that she wears the heel cushions and compression hoses daily.  No new complaints at this time.  Patient is here for further evaluation and treatment.    Past Medical History:  Diagnosis Date  . Alpha thalassemia trait 01/26/2010   02/2012: Nl CBC ex H&H-10.7/34.8, MCV-69   . Anemia   . Anxiety   . Anxiety and depression   . Cellulitis 05/09/2017  . Depression   . Diabetes mellitus   . Foot pain, right 04/30/2013  . GERD (gastroesophageal reflux disease)   . Headache(784.0)   . Hyperlipidemia   . Hypertension   . Iron deficiency 01/21/2017  . Microcytic anemia 01/26/2010   02/2012: Nl CBC ex H&H-10.7/34.8, MCV-69   . NECK PAIN, CHRONIC 10/21/2008   +chronic back pain    . Obesity   . Obstructive sleep apnea   . Osteoarthritis    Left knee; right shoulder; chronic neck and back pain  . Pruritus   . PVD (peripheral vascular disease) ([place]) 01/28/2014  . Seizures ([place])   . Shoulder pain, right 04/14/2015  . Tremor    This started months ago after her seizure progressing to very poor hand writing  . Urinary incontinence   . UTI (urinary tract infection) 01/18/2013     Objective/Physical Exam General: The patient is alert and oriented x3 in no acute distress.  Dermatology:  No open lesions noted.  Skin is warm dry and supple bilateral lower extremities.  There is some hyperkeratosis with xerosis of the skin noted.  Vascular: Palpable pedal pulses bilaterally. Mild edema noted. Capillary refill within normal limits. Varicosities noted bilateral lower extremities with hemosiderin around the ankles bilateral  Neurological: Epicritic and protective threshold diminished bilaterally.   Musculoskeletal Exam: Range of motion within normal limits to all pedal and ankle joints bilateral. Muscle strength 5/5 in all groups bilateral.   Assessment: #1  Diabetes mellitus with peripheral polyneuropathy #2  varicosities bilateral lower extremities #3 Pre-ulcerative callus lesions noted to the right heel   Plan of Care:  #1 Patient was evaluated. #2  Posterior heels have completely reepithelialized and healed.  Recommend that she continues to wear the bunny boots to offload the heels when she sleeps #3 recommend good foot lotion daily to help with the xerosis and dry skin. #4 continue wearing diabetic shoes #5 return to clinic in 3 months for routine foot care    Edrick Kins, DPM [facility]  Dr. Edrick Kins, DPM    [address], [place] [address]                Office [phone]  Fax [phone]

## 2020-08-23 ENCOUNTER — Other Ambulatory Visit: Payer: Self-pay | Admitting: Family Medicine

## 2020-08-24 ENCOUNTER — Other Ambulatory Visit: Payer: Self-pay | Admitting: Cardiology

## 2020-08-30 ENCOUNTER — Institutional Professional Consult (permissible substitution): Payer: Medicare HMO | Admitting: Pulmonary Disease

## 2020-09-05 ENCOUNTER — Telehealth: Payer: Self-pay

## 2020-09-05 NOTE — Telephone Encounter (Signed)
Lvm for patient scheduled for 09/07/20 @ 2:20

## 2020-09-05 NOTE — Telephone Encounter (Signed)
Called and lvm that she was a diabetic and having problems with her legs. Needs an appointment to address.

## 2020-09-07 ENCOUNTER — Other Ambulatory Visit: Payer: Self-pay

## 2020-09-07 ENCOUNTER — Encounter: Payer: Self-pay | Admitting: Nurse Practitioner

## 2020-09-07 ENCOUNTER — Ambulatory Visit (INDEPENDENT_AMBULATORY_CARE_PROVIDER_SITE_OTHER): Payer: Medicare HMO | Admitting: Nurse Practitioner

## 2020-09-07 DIAGNOSIS — R269 Unspecified abnormalities of gait and mobility: Secondary | ICD-10-CM

## 2020-09-07 NOTE — Progress Notes (Signed)
Acute Office Visit  Subjective:    Patient ID: Laura Atkins, female    DOB: Jul 15, 1941, 79 y.o.   MRN: [id number]  Chief Complaint  Patient presents with  . Leg Pain    X 1 week R and L legs; weak feeling.     HPI Patient is in today for acute leg pain.  She feels weakness from her knees down.  She first noticed this several weeks ago.  She feels like her legs aren't going to carry her.  She has been seeing a specialist for a wound on her right heel.  She was discharged from nursing facility at beginning of October.  She denies recent falls.  She would like to get a new walker that helps her stay more upright.      Past Medical History:  Diagnosis Date  . Alpha thalassemia trait 01/26/2010   02/2012: Nl CBC ex H&H-10.7/34.8, MCV-69   . Anemia   . Anxiety   . Anxiety and depression   . Cellulitis 05/09/2017  . Depression   . Diabetes mellitus   . Foot pain, right 04/30/2013  . GERD (gastroesophageal reflux disease)   . Headache(784.0)   . Hyperlipidemia   . Hypertension   . Iron deficiency 01/21/2017  . Microcytic anemia 01/26/2010   02/2012: Nl CBC ex H&H-10.7/34.8, MCV-69   . NECK PAIN, CHRONIC 10/21/2008   +chronic back pain    . Obesity   . Obstructive sleep apnea   . Osteoarthritis    Left knee; right shoulder; chronic neck and back pain  . Pruritus   . PVD (peripheral vascular disease) ([place]) 01/28/2014  . Seizures ([place])   . Shoulder pain, right 04/14/2015  . Tremor    This started months ago after her seizure progressing to very poor hand writing  . Urinary incontinence   . UTI (urinary tract infection) 01/18/2013    Past Surgical History:  Procedure Laterality Date  . ABDOMINAL HYSTERECTOMY    . BREAST EXCISIONAL BIOPSY     Left; cyst  . CATARACT EXTRACTION Right    12/2017  . CATARACT EXTRACTION W/ INTRAOCULAR LENS IMPLANT Left 09/07/2013  . CHOLECYSTECTOMY    . COLONOSCOPY    . COLONOSCOPY N/A 07/20/2015   Procedure: COLONOSCOPY;  Surgeon: Rogene Houston,  MD;  [facility];  Service: Endoscopy;  Laterality: N/A;  930  . EYE SURGERY Left 09/07/2013   cataract    Family History  Problem Relation Age of Onset  . Lung cancer Mother 63  . Kidney disease Father   . Diabetes Sister   . Keloids Brother   . ADD / ADHD Grandchild   . Bipolar disorder Grandchild   . Bipolar disorder Daughter   . Seizures Daughter   . Heart disease Daughter   . Kidney disease Son   . Neuropathy Son   . Kidney disease Son   . Edema Daughter   . Breast cancer Daughter 74  . Allergies Daughter   . Alcohol abuse Neg Hx   . Drug abuse Neg Hx     Social History   Socioeconomic History  . Marital status: Married    Spouse name: saunders  . Number of children: 5  . Years of education: 22  . Highest education level: Not on file  Occupational History  . Occupation: Disabled     Employer: RETIRED  Tobacco Use  . Smoking status: Never Smoker  . Smokeless tobacco: Never Used  Vaping Use  .  Vaping Use: Never used  Substance and Sexual Activity  . Alcohol use: No    Alcohol/week: 0.0 standard drinks  . Drug use: No  . Sexual activity: Yes    Birth control/protection: Surgical  Other Topics Concern  . Not on file  Social History Narrative   07/27/20 Patient lives at home with her husband [name] and dgtrIvin Booty.  Patient is retired.    Right handed.    Five Children.    Caffeine- 2 daily   Social Determinants of Health   Financial Resource Strain: Low Risk   . Difficulty of Paying Living Expenses: Not very hard  Food Insecurity: No Food Insecurity  . Worried About Charity fundraiser in the Last Year: Never true  . Ran Out of Food in the Last Year: Never true  Transportation Needs: No Transportation Needs  . Lack of Transportation (Medical): No  . Lack of Transportation (Non-Medical): No  Physical Activity: Inactive  . Days of Exercise per Week: 0 days  . Minutes of Exercise per Session: 0 min  Stress: No Stress Concern Present   . Feeling of Stress : Only a little  Social Connections: Moderately Integrated  . Frequency of Communication with Friends and Family: Three times a week  . Frequency of Social Gatherings with Friends and Family: Three times a week  . Attends Religious Services: More than 4 times per year  . Active Member of Clubs or Organizations: No  . Attends Archivist Meetings: Never  . Marital Status: Married  Human resources officer Violence: Not on file    Outpatient Medications Prior to Visit  Medication Sig Dispense Refill  . Accu-Chek FastClix Lancets MISC 1 each by Does not apply route 4 (four) times daily. Use to monitor glucose levels 4 times daily; E11.65 306 each 2  . Alcohol Swabs (B-D SINGLE USE SWABS REGULAR) PADS 1 each by Does not apply route 4 (four) times daily. Use to prep site for glucose monitoring 4 times daily; E11.65 300 each 2  . amLODipine (NORVASC) 5 MG tablet Take 1 tablet (5 mg total) by mouth daily. 30 tablet 3  . aspirin 81 MG tablet Take 81 mg by mouth daily.    Marland Kitchen atorvastatin (LIPITOR) 40 MG tablet TAKE 1 TABLET BY MOUTH EVERY DAY 30 tablet 2  . benazepril (LOTENSIN) 20 MG tablet     . Blood Glucose Monitoring Suppl (ACCU-CHEK GUIDE ME) w/Device KIT 1 each by Does not apply route 4 (four) times daily. Use to monitor glucose levels 4 times daily; E11.65 1 kit 0  . Cholecalciferol 125 MCG (5000 UT) TABS Take 1 tablet by mouth daily.    . Continuous Blood Gluc Sensor (FREESTYLE LIBRE 14 DAY SENSOR) MISC 1 Device by Other route every 14 (fourteen) days. 6 each 3  . diphenhydrAMINE (BENADRYL) 25 MG tablet Take 25 mg by mouth at bedtime.    . donepezil (ARICEPT) 5 MG tablet Take 1 tablet (5 mg total) by mouth at bedtime. 30 tablet 2  . ferrous sulfate 325 (65 FE) MG tablet Take 1 tablet (325 mg total) by mouth daily with breakfast. 30 tablet 5  . gabapentin (NEURONTIN) 400 MG capsule TAKE ONE CAPSULE BY MOUTH EVERY MORNING and TAKE ONE CAPSULE AT NOON and TAKE TWO CAPSULES  AT BEDTIME 360 capsule 1  . glucose blood (ACCU-CHEK GUIDE) test strip 1 each by Other route 4 (four) times daily. 200 strip 12  . insulin NPH-regular Human (NOVOLIN 70/30 RELION) (70-30) 100  UNIT/ML injection 25 units with breakfast, and 10 units with supper. (Patient taking differently: 20 units with breakfast, and 10 units with supper.) 20 mL 11  . Insulin Syringe-Needle U-100 (INSULIN SYRINGE 1CC/31GX5/16") 31G X 5/16" 1 ML MISC 1 each by Does not apply route daily. Use to inject insulin daily; E11.65 90 each 2  . Multiple Vitamin (MULTIVITAMIN) capsule Take 1 capsule by mouth daily.    . nitrofurantoin (MACRODANTIN) 100 MG capsule Take 100 mg by mouth 4 (four) times daily. Started on 07/26/20    . nitrofurantoin, macrocrystal-monohydrate, (MACROBID) 100 MG capsule Take 1 capsule (100 mg total) by mouth 2 (two) times daily. 14 capsule 0  . omeprazole (PRILOSEC) 20 MG capsule TAKE ONE CAPSULE BY MOUTH EVERY DAY 30 capsule 2  . potassium chloride SA (KLOR-CON) 20 MEQ tablet TAKE 1 TABLET BY MOUTH TWICE DAILY 180 tablet 1  . sertraline (ZOLOFT) 100 MG tablet Take 1 tablet (100 mg total) by mouth daily. 60 tablet 5  . torsemide (DEMADEX) 20 MG tablet Take 2 tablets (40 mg total) by mouth daily. 60 tablet 6  . UNABLE TO FIND Walker x 1  DX unsteady gait, osteoarthritis of left knee (Patient not taking: Reported on 09/07/2020) 1 each 0  . UNABLE TO FIND Elevated Toliet Seat x 1 DX: unsteady gait, back pain, osteoarthritis of left knee (Patient not taking: Reported on 09/07/2020) 1 each 0  . UNABLE TO FIND Standing upright walker x 1  DX M54.40, M19.90 (Patient not taking: Reported on 09/07/2020) 1 each 0   No facility-administered medications prior to visit.    Allergies  Allergen Reactions  . Penicillins Shortness Of Breath, Itching and Rash  . Prednisone Shortness Of Breath, Itching and Rash  . Propoxyphene N-Acetaminophen Itching and Nausea And Vomiting  . Sulfa Antibiotics Itching and  Nausea And Vomiting    Review of Systems  Constitutional: Negative.   Respiratory: Negative.   Cardiovascular: Negative.   Musculoskeletal:       Weakness to knees and lower legs when walking; feels like she is going to fall  Neurological: Negative.        Objective:    Physical Exam Constitutional:      Appearance: She is obese.  Musculoskeletal:        General: No swelling, tenderness, deformity or signs of injury.  Neurological:     General: No focal deficit present.     Mental Status: She is alert and oriented to person, place, and time.     BP (!) 141/66   Pulse 82   Temp 98.3 F (36.8 C)   Resp 20   Ht 5' 8"  (1.727 m)   Wt 255 lb (115.7 kg)   SpO2 97%   BMI 38.77 kg/m  Wt Readings from Last 3 Encounters:  09/07/20 255 lb (115.7 kg)  08/10/20 257 lb (116.6 kg)  07/13/20 254 lb 0.6 oz (115.2 kg)    Health Maintenance Due  Topic Date Due  . Hepatitis C Screening  Never done  . COVID-19 Vaccine (1) Never done  . INFLUENZA VACCINE  04/24/2020    There are no preventive care reminders to display for this patient.   Lab Results  Component Value Date   TSH 1.129 05/27/2020   Lab Results  Component Value Date   WBC 6.3 06/03/2020   HGB 9.6 (L) 06/03/2020   HCT 33.7 (L) 06/03/2020   MCV 69.3 (L) 06/03/2020   PLT 170 06/03/2020   Lab Results  Component Value Date   NA 138 06/03/2020   K 3.7 06/03/2020   CO2 26 06/03/2020   GLUCOSE 145 (H) 06/03/2020   BUN 19 06/03/2020   CREATININE 1.13 (H) 06/03/2020   BILITOT 0.5 06/03/2020   ALKPHOS 76 06/03/2020   AST 35 06/03/2020   ALT 38 06/03/2020   PROT 7.1 06/03/2020   ALBUMIN 3.6 06/03/2020   CALCIUM 9.5 06/03/2020   ANIONGAP 11 06/03/2020   Lab Results  Component Value Date   CHOL 149 12/01/2019   Lab Results  Component Value Date   HDL 58 12/01/2019   Lab Results  Component Value Date   LDLCALC 72 12/01/2019   Lab Results  Component Value Date   TRIG 100 12/01/2019   Lab Results   Component Value Date   CHOLHDL 2.6 12/01/2019   Lab Results  Component Value Date   HGBA1C 8.3 (A) 06/30/2020       Assessment & Plan:   Problem List Items Addressed This Visit      Other   UNSTEADY GAIT    -she walks with a walker, but would like one that helps her stand more upright -will order walker from [place] -she feels like her legs are going to give out despite using her current walker -refer to PT for eval and treatment      Relevant Orders   Ambulatory referral to Physical Therapy   For home use only DME 4 wheeled rolling walker with seat ([id number])       No orders of the defined types were placed in this encounter.    Noreene Larsson, NP

## 2020-09-07 NOTE — Assessment & Plan Note (Addendum)
-  she walks with a walker, but would like one that helps her stand more upright -will order walker from [place] -she feels like her legs are going to give out despite using her current walker -refer to PT for eval and treatment

## 2020-09-25 ENCOUNTER — Other Ambulatory Visit: Payer: Self-pay | Admitting: Family Medicine

## 2020-09-27 ENCOUNTER — Ambulatory Visit (INDEPENDENT_AMBULATORY_CARE_PROVIDER_SITE_OTHER): Payer: Medicare HMO | Admitting: Nurse Practitioner

## 2020-09-27 ENCOUNTER — Ambulatory Visit: Payer: Medicare HMO | Admitting: Family Medicine

## 2020-09-27 ENCOUNTER — Other Ambulatory Visit: Payer: Self-pay

## 2020-09-27 ENCOUNTER — Encounter: Payer: Self-pay | Admitting: Nurse Practitioner

## 2020-09-27 VITALS — BP 138/51 | HR 82 | Temp 98.1°F | Resp 20 | Ht 68.0 in | Wt 258.0 lb

## 2020-09-27 DIAGNOSIS — E1165 Type 2 diabetes mellitus with hyperglycemia: Secondary | ICD-10-CM

## 2020-09-27 DIAGNOSIS — R32 Unspecified urinary incontinence: Secondary | ICD-10-CM

## 2020-09-27 DIAGNOSIS — Z139 Encounter for screening, unspecified: Secondary | ICD-10-CM | POA: Diagnosis not present

## 2020-09-27 DIAGNOSIS — F0390 Unspecified dementia without behavioral disturbance: Secondary | ICD-10-CM | POA: Diagnosis not present

## 2020-09-27 NOTE — Assessment & Plan Note (Signed)
-  MMSE today is 26/30, up from baseline of 23 -no change in dementia meds today

## 2020-09-27 NOTE — Assessment & Plan Note (Signed)
-  incontinence worse, but denies dysuria; will get U/A -unable to provide U/A today d/t bad aim; will drop off sample -Rx. Incontinence supplies

## 2020-09-27 NOTE — Progress Notes (Signed)
Acute Office Visit  Subjective:    Patient ID: Laura Atkins, female    DOB: 1940/12/13, 80 y.o.   MRN: [id number]  Chief Complaint  Patient presents with  . Follow-up  . Memory Loss    HPI Patient is in today for dementia follow-up.  She saw neurology several months ago and had considered several medcations.  Past Medical History:  Diagnosis Date  . Alpha thalassemia trait 01/26/2010   02/2012: Nl CBC ex H&H-10.7/34.8, MCV-69   . Anemia   . Anxiety   . Anxiety and depression   . Cellulitis 05/09/2017  . Depression   . Diabetes mellitus   . Foot pain, right 04/30/2013  . GERD (gastroesophageal reflux disease)   . Headache(784.0)   . Hyperlipidemia   . Hypertension   . Iron deficiency 01/21/2017  . Microcytic anemia 01/26/2010   02/2012: Nl CBC ex H&H-10.7/34.8, MCV-69   . NECK PAIN, CHRONIC 10/21/2008   +chronic back pain    . Obesity   . Obstructive sleep apnea   . Osteoarthritis    Left knee; right shoulder; chronic neck and back pain  . Pruritus   . PVD (peripheral vascular disease) ([place]) 01/28/2014  . Seizures ([place])   . Shoulder pain, right 04/14/2015  . Tremor    This started months ago after her seizure progressing to very poor hand writing  . Urinary incontinence   . UTI (urinary tract infection) 01/18/2013    Past Surgical History:  Procedure Laterality Date  . ABDOMINAL HYSTERECTOMY    . BREAST EXCISIONAL BIOPSY     Left; cyst  . CATARACT EXTRACTION Right    12/2017  . CATARACT EXTRACTION W/ INTRAOCULAR LENS IMPLANT Left 09/07/2013  . CHOLECYSTECTOMY    . COLONOSCOPY    . COLONOSCOPY N/A 07/20/2015   Procedure: COLONOSCOPY;  Surgeon: Rogene Houston, MD;  [facility];  Service: Endoscopy;  Laterality: N/A;  930  . EYE SURGERY Left 09/07/2013   cataract    Family History  Problem Relation Age of Onset  . Lung cancer Mother 25  . Kidney disease Father   . Diabetes Sister   . Keloids Brother   . ADD / ADHD Grandchild   . Bipolar disorder  Grandchild   . Bipolar disorder Daughter   . Seizures Daughter   . Heart disease Daughter   . Kidney disease Son   . Neuropathy Son   . Kidney disease Son   . Edema Daughter   . Breast cancer Daughter 1  . Allergies Daughter   . Alcohol abuse Neg Hx   . Drug abuse Neg Hx     Social History   Socioeconomic History  . Marital status: Married    Spouse name: Laura Atkins  . Number of children: 5  . Years of education: 57  . Highest education level: Not on file  Occupational History  . Occupation: Disabled     Employer: RETIRED  Tobacco Use  . Smoking status: Never Smoker  . Smokeless tobacco: Never Used  Vaping Use  . Vaping Use: Never used  Substance and Sexual Activity  . Alcohol use: No    Alcohol/week: 0.0 standard drinks  . Drug use: No  . Sexual activity: Yes    Birth control/protection: Surgical  Other Topics Concern  . Not on file  Social History Narrative   07/27/20 Patient lives at home with her husband Laura Atkins) and dgtrIvin Atkins.  Patient is retired.    Right handed.  Five Children.    Caffeine- 2 daily   Social Determinants of Health   Financial Resource Strain: Low Risk   . Difficulty of Paying Living Expenses: Not very hard  Food Insecurity: No Food Insecurity  . Worried About Charity fundraiser in the Last Year: Never true  . Ran Out of Food in the Last Year: Never true  Transportation Needs: No Transportation Needs  . Lack of Transportation (Medical): No  . Lack of Transportation (Non-Medical): No  Physical Activity: Inactive  . Days of Exercise per Week: 0 days  . Minutes of Exercise per Session: 0 min  Stress: No Stress Concern Present  . Feeling of Stress : Only a little  Social Connections: Moderately Integrated  . Frequency of Communication with Friends and Family: Three times a week  . Frequency of Social Gatherings with Friends and Family: Three times a week  . Attends Religious Services: More than 4 times per year  . Active Member of  Clubs or Organizations: No  . Attends Archivist Meetings: Never  . Marital Status: Married  Human resources officer Violence: Not on file    Outpatient Medications Prior to Visit  Medication Sig Dispense Refill  . Accu-Chek FastClix Lancets MISC 1 each by Does not apply route 4 (four) times daily. Use to monitor glucose levels 4 times daily; E11.65 306 each 2  . Alcohol Swabs (B-D SINGLE USE SWABS REGULAR) PADS 1 each by Does not apply route 4 (four) times daily. Use to prep site for glucose monitoring 4 times daily; E11.65 300 each 2  . amLODipine (NORVASC) 5 MG tablet Take 1 tablet (5 mg total) by mouth daily. 30 tablet 3  . aspirin 81 MG tablet Take 81 mg by mouth daily.    Marland Kitchen atorvastatin (LIPITOR) 40 MG tablet TAKE 1 TABLET BY MOUTH EVERY DAY 30 tablet 2  . benazepril (LOTENSIN) 20 MG tablet     . Blood Glucose Monitoring Suppl (ACCU-CHEK GUIDE ME) w/Device KIT 1 each by Does not apply route 4 (four) times daily. Use to monitor glucose levels 4 times daily; E11.65 1 kit 0  . Cholecalciferol 125 MCG (5000 UT) TABS Take 1 tablet by mouth daily.    . Continuous Blood Gluc Sensor (FREESTYLE LIBRE 14 DAY SENSOR) MISC 1 Device by Other route every 14 (fourteen) days. 6 each 3  . diphenhydrAMINE (BENADRYL) 25 MG tablet Take 25 mg by mouth at bedtime.    . donepezil (ARICEPT) 5 MG tablet Take 1 tablet (5 mg total) by mouth at bedtime. 30 tablet 2  . ferrous sulfate 325 (65 FE) MG tablet Take 1 tablet (325 mg total) by mouth daily with breakfast. 30 tablet 5  . gabapentin (NEURONTIN) 400 MG capsule TAKE ONE CAPSULE BY MOUTH EVERY MORNING and TAKE ONE CAPSULE AT NOON and TAKE TWO CAPSULES AT BEDTIME 360 capsule 1  . glucose blood (ACCU-CHEK GUIDE) test strip 1 each by Other route 4 (four) times daily. 200 strip 12  . insulin NPH-regular Human (NOVOLIN 70/30 RELION) (70-30) 100 UNIT/ML injection 25 units with breakfast, and 10 units with supper. (Patient taking differently: 20 units with  breakfast, and 10 units with supper.) 20 mL 11  . Insulin Syringe-Needle U-100 (INSULIN SYRINGE 1CC/31GX5/16") 31G X 5/16" 1 ML MISC 1 each by Does not apply route daily. Use to inject insulin daily; E11.65 90 each 2  . Multiple Vitamin (MULTIVITAMIN) capsule Take 1 capsule by mouth daily.    . nitrofurantoin (MACRODANTIN) 100  MG capsule Take 100 mg by mouth 4 (four) times daily. Started on 07/26/20    . nitrofurantoin, macrocrystal-monohydrate, (MACROBID) 100 MG capsule Take 1 capsule (100 mg total) by mouth 2 (two) times daily. 14 capsule 0  . omeprazole (PRILOSEC) 20 MG capsule TAKE ONE CAPSULE BY MOUTH EVERY DAY 30 capsule 2  . potassium chloride SA (KLOR-CON) 20 MEQ tablet TAKE 1 TABLET BY MOUTH TWICE DAILY 60 tablet 1  . sertraline (ZOLOFT) 100 MG tablet Take 1 tablet (100 mg total) by mouth daily. 60 tablet 5  . torsemide (DEMADEX) 20 MG tablet Take 2 tablets (40 mg total) by mouth daily. 60 tablet 6  . UNABLE TO FIND Walker x 1  DX unsteady gait, osteoarthritis of left knee 1 each 0  . UNABLE TO FIND Elevated Toliet Seat x 1 DX: unsteady gait, back pain, osteoarthritis of left knee 1 each 0  . UNABLE TO FIND Standing upright walker x 1  DX M54.40, M19.90 1 each 0   No facility-administered medications prior to visit.    Allergies  Allergen Reactions  . Penicillins Shortness Of Breath, Itching and Rash  . Prednisone Shortness Of Breath, Itching and Rash  . Propoxyphene N-Acetaminophen Itching and Nausea And Vomiting  . Sulfa Antibiotics Itching and Nausea And Vomiting    Review of Systems  Constitutional: Negative.   Respiratory: Negative.   Cardiovascular: Negative.   Psychiatric/Behavioral: Negative.        Objective:    Physical Exam Constitutional:      Appearance: Normal appearance. She is obese.  Cardiovascular:     Rate and Rhythm: Normal rate and regular rhythm.     Pulses: Normal pulses.     Heart sounds: Normal heart sounds.  Pulmonary:     Effort:  Pulmonary effort is normal.     Breath sounds: Normal breath sounds.  Neurological:     General: No focal deficit present.     Mental Status: She is alert and oriented to person, place, and time. Mental status is at baseline.     BP (!) 138/51   Pulse 82   Temp 98.1 F (36.7 C)   Resp 20   Ht _0  (1.727 m)   Wt 258 lb (117 kg)   SpO2 94%   BMI 39.23 kg/m  Wt Readings from Last 3 Encounters:  09/27/20 258 lb (117 kg)  09/07/20 255 lb (115.7 kg)  08/10/20 257 lb (116.6 kg)    Health Maintenance Due  Topic Date Due  . Hepatitis C Screening  Never done  . COVID-19 Vaccine (1) Never done  . INFLUENZA VACCINE  04/24/2020    There are no preventive care reminders to display for this patient.   Lab Results  Component Value Date   TSH 1.129 05/27/2020   Lab Results  Component Value Date   WBC 6.3 06/03/2020   HGB 9.6 (L) 06/03/2020   HCT 33.7 (L) 06/03/2020   MCV 69.3 (L) 06/03/2020   PLT 170 06/03/2020   Lab Results  Component Value Date   NA 138 06/03/2020   K 3.7 06/03/2020   CO2 26 06/03/2020   GLUCOSE 145 (H) 06/03/2020   BUN 19 06/03/2020   CREATININE 1.13 (H) 06/03/2020   BILITOT 0.5 06/03/2020   ALKPHOS 76 06/03/2020   AST 35 06/03/2020   ALT 38 06/03/2020   PROT 7.1 06/03/2020   ALBUMIN 3.6 06/03/2020   CALCIUM 9.5 06/03/2020   ANIONGAP 11 06/03/2020   Lab Results  Component Value Date   CHOL 149 12/01/2019   Lab Results  Component Value Date   HDL 58 12/01/2019   Lab Results  Component Value Date   LDLCALC 72 12/01/2019   Lab Results  Component Value Date   TRIG 100 12/01/2019   Lab Results  Component Value Date   CHOLHDL 2.6 12/01/2019   Lab Results  Component Value Date   HGBA1C 8.3 (A) 06/30/2020       Assessment & Plan:   Problem List Items Addressed This Visit      Nervous and Auditory   Dementia ([place])    -MMSE today is 26/30, up from baseline of 23 -no change in dementia meds today      Relevant Orders   CBC  with Differential/Platelet   CMP14+EGFR   Lipid Panel With LDL/HDL Ratio     Other   Urinary incontinence    -incontinence worse, but denies dysuria; will get U/A -unable to provide U/A today d/t bad aim; will drop off sample -Rx. Incontinence supplies      Relevant Orders   Incontinence supply    Other Visit Diagnoses    Screening due    -  Primary   Relevant Orders   HCV Ab w/Rflx to Verification   Uncontrolled type 2 diabetes mellitus with hyperglycemia ([place])       Relevant Orders   Hemoglobin A1c       No orders of the defined types were placed in this encounter.    Noreene Larsson, NP

## 2020-09-29 ENCOUNTER — Ambulatory Visit (HOSPITAL_COMMUNITY): Payer: Medicare HMO | Attending: Nurse Practitioner | Admitting: Physical Therapy

## 2020-09-29 ENCOUNTER — Encounter (HOSPITAL_COMMUNITY): Payer: Self-pay | Admitting: Physical Therapy

## 2020-09-29 ENCOUNTER — Other Ambulatory Visit: Payer: Self-pay

## 2020-09-29 DIAGNOSIS — R2689 Other abnormalities of gait and mobility: Secondary | ICD-10-CM | POA: Insufficient documentation

## 2020-09-29 DIAGNOSIS — M6281 Muscle weakness (generalized): Secondary | ICD-10-CM | POA: Insufficient documentation

## 2020-09-29 DIAGNOSIS — R262 Difficulty in walking, not elsewhere classified: Secondary | ICD-10-CM | POA: Insufficient documentation

## 2020-09-29 NOTE — Therapy (Signed)
[facility] [place], [place], [address] Phone: [phone]   Fax:  [phone]  Physical Therapy Evaluation  Patient Details  Name: Laura Atkins MRN: [id number] Date of Birth: December 03, 1940 Referring Provider (PT): Pilar Jarvis   Encounter Date: 09/29/2020   PT End of Session - 09/29/20 1306    Visit Number 1    Number of Visits 12    Date for PT Re-Evaluation 10/29/20    Authorization Type [employer] requested visits    Progress Note Due on Visit 10    PT Start Time 1050    PT Stop Time 1130    PT Time Calculation (min) 40 min           Past Medical History:  Diagnosis Date  . Alpha thalassemia trait 01/26/2010   02/2012: Nl CBC ex H&H-10.7/34.8, MCV-69   . Anemia   . Anxiety   . Anxiety and depression   . Cellulitis 05/09/2017  . Depression   . Diabetes mellitus   . Foot pain, right 04/30/2013  . GERD (gastroesophageal reflux disease)   . Headache(784.0)   . Hyperlipidemia   . Hypertension   . Iron deficiency 01/21/2017  . Microcytic anemia 01/26/2010   02/2012: Nl CBC ex H&H-10.7/34.8, MCV-69   . NECK PAIN, CHRONIC 10/21/2008   +chronic back pain    . Obesity   . Obstructive sleep apnea   . Osteoarthritis    Left knee; right shoulder; chronic neck and back pain  . Pruritus   . PVD (peripheral vascular disease) ([place]) 01/28/2014  . Seizures ([place])   . Shoulder pain, right 04/14/2015  . Tremor    This started months ago after her seizure progressing to very poor hand writing  . Urinary incontinence   . UTI (urinary tract infection) 01/18/2013    Past Surgical History:  Procedure Laterality Date  . ABDOMINAL HYSTERECTOMY    . BREAST EXCISIONAL BIOPSY     Left; cyst  . CATARACT EXTRACTION Right    12/2017  . CATARACT EXTRACTION W/ INTRAOCULAR LENS IMPLANT Left 09/07/2013  . CHOLECYSTECTOMY    . COLONOSCOPY    . COLONOSCOPY N/A 07/20/2015   Procedure: COLONOSCOPY;  Surgeon: Rogene Houston, MD;  [facility];  Service: Endoscopy;  Laterality: N/A;  930  . EYE SURGERY Left 09/07/2013   cataract    There were no vitals filed for this visit.    Subjective Assessment - 09/29/20 1053    Subjective Pt states that one day she woke up and she was in rehab somewhere.  Now her legs are stiff and weak and she can not walk well.  She states that she has memory lapses. Per records the pt was admitted into the hospital with seizure like activites on 05/27/2020 and discharged on 9/10 to [facility].  She was discharged from the SNF on 10/15.  She has not had any home health therapy.    Pertinent History HTN, DM, AKI seizure like activity.    How long can you sit comfortably? no problem    How long can you stand comfortably? tested with no assisted device pt needs contact guard for 1:55    How long can you walk comfortably? less than two minutes    Patient Stated Goals to walk better    Currently in Pain? Yes    Pain Score 9     Pain Location Knee    Pain Orientation Right;Left  Pain Descriptors / Indicators Tightness;Aching    Pain Type Chronic pain    Pain Onset More than a month ago    Aggravating Factors  Wt bearing    Pain Relieving Factors medication or rubbing    Effect of Pain on Daily Activities limits              [facility] PT Assessment - 09/29/20 0001      Assessment   Medical Diagnosis Difficulty walking    Referring Provider (PT) Pilar Jarvis    Onset Date/Surgical Date 05/27/20    Prior Therapy acute and SNF      Precautions   Precautions Fall      Restrictions   Weight Bearing Restrictions No      Balance Screen   Has the patient fallen in the past 6 months No    Has the patient had a decrease in activity level because of a fear of falling?  Yes    Is the patient reluctant to leave their home because of a fear of falling?  No      Home Environment   Living Environment Private residence    Home Access Ramped entrance    Long Neck One level      Prior  Function   Level of Independence Independent with household mobility with device      Cognition   Overall Cognitive Status Within Functional Limits for tasks assessed      Functional Tests   Functional tests Single leg stance;Sit to Stand      Single Leg Stance   Comments unable      Sit to Stand   Comments unable without UE assist; with UE assist in 30 seconds pt is able to complete 2  reps      ROM / Strength   AROM / PROM / Strength Strength;AROM      AROM   AROM Assessment Site Knee    Right/Left Knee Right;Left    Right Knee Extension 10    Right Knee Flexion 100    Left Knee Extension 15    Left Knee Flexion 90      Strength   Strength Assessment Site Ankle;Hip;Knee    Right/Left Hip Right;Left    Right Hip Flexion 4/5    Right Hip Extension 2/5    Right Hip ABduction 4-/5    Left Hip Extension 2/5    Left Hip ABduction 4+/5    Right/Left Knee Right;Left    Right Knee Flexion 4+/5    Right Knee Extension 4+/5    Left Knee Flexion 4+/5    Left Knee Extension 4+/5    Right/Left Ankle Right;Left    Right Ankle Dorsiflexion 3/5    Left Ankle Dorsiflexion 3+/5      Ambulation/Gait   Ambulation Distance (Feet) 58 Feet    Assistive device Rolling walker    Gait Pattern Step-to pattern    Gait Comments 2 minute walk testl                      Objective measurements completed on examination: See above findings.       [facility] Adult PT Treatment/Exercise - 09/29/20 0001      Exercises   Exercises Knee/Hip      Knee/Hip Exercises: Seated   Long Arc Quad Both;5 reps    Marching Both;5 reps    Abduction/Adduction  Both;10 reps  PT Education - 09/29/20 1305    Education Details HEP    Person(s) Educated Patient    Methods Explanation;Handout    Comprehension Verbalized understanding;Returned demonstration            PT Short Term Goals - 09/29/20 1321      PT SHORT TERM GOAL #1   Title Patient will independently  verbalize and demo initial HEP in order to improve B LE strength with ambulation and transfers.     Time 2    Status New    Target Date 10/13/20      PT SHORT TERM GOAL #2   Title PT will be able to complete 5 sit to stand in a 30 second period using UE assist to demonstrate improved power.    Time 2    Period Weeks    Status New      PT SHORT TERM GOAL #3   Title PT  will be able to ambulate 185ft with a RW in 2 minute time period to demonstrate reduce risk of falling.    Time 2    Period Weeks    Status New             PT Long Term Goals - 09/29/20 1323      PT LONG TERM GOAL #1   Title PT to be able to come sit to stand without the use of UE to demonstrate improved balance and strength.    Time 4    Period Weeks    Status New    Target Date 10/27/20      PT LONG TERM GOAL #2   Title PT to be able to single leg stance on both LE for 5 seconds to decrease risk of falling    Time 4    Period Weeks    Status New      PT LONG TERM GOAL #3   Title PT to be able to walk 228ft with a rolling walker in a 2 minute timeframe to allow pt to be able to make it to the restroom in time without having an accident    Time 4    Status New                  Plan - 09/29/20 1307    Clinical Impression Statement Ms. Gallardo is a 80 yo female who has used a rolling walker to ambulate for quite a while.  She had a seizure like activity in September and lost consciousness.  She states that the next thing that she remembers is waking in a skilled nursing facility.  The pt and her husband states that Ms. [place] gt is much more unsteady than before and she is fearful that she is going to fall therefore she has been referred to skilled PT.  Evaluation demonstrates decreased strength of core and hip mm, decreased knee ROM, decreased activity tolerance, decreased balance, difficulty walking and pain.  Ms. Szumski will benefit from skilled PT to address these deficits and improve the stability,  speed and fluidness of her gait in order to reduce her risk of falling.    Personal Factors and Comorbidities Fitness;Past/Current Experience;Comorbidity 3+    Comorbidities HTN, seizure like activity, DM, memory issues    Examination-Activity Limitations Caring for Others;Carry;Dressing;Lift;Locomotion Level;Stairs;Stand    Examination-Participation Restrictions Cleaning;Laundry;Meal Prep;Shop    Stability/Clinical Decision Making Evolving/Moderate complexity    Clinical Decision Making Moderate    Rehab Potential Good    PT Frequency  3x / week    PT Treatment/Interventions Gait training;Stair training;Functional mobility training;Therapeutic activities;Therapeutic exercise;Balance training;Neuromuscular re-education;Patient/family education    PT Next Visit Plan PT will benefit from coordination, strenthening and balance activity.  Begin heelraises, functional squat, marching, power rocking, gait focusing on increasing velocity and step thru technique , semi-tandem stance with wt shifting and advance as able           Patient will benefit from skilled therapeutic intervention in order to improve the following deficits and impairments:  Abnormal gait,Decreased activity tolerance,Decreased balance,Decreased endurance,Decreased mobility,Decreased range of motion,Difficulty walking,Decreased strength,Pain  Visit Diagnosis: Difficulty in walking, not elsewhere classified  Muscle weakness (generalized)  Other abnormalities of gait and mobility     Problem List Patient Active Problem List   Diagnosis Date Noted  . Dementia ([place]) 07/13/2020  . Cognitive deficits   . Infection   . Carotid bruit 05/26/2020  . Hyperlipidemia associated with type 2 diabetes mellitus ([place]) 12/08/2019  . Iron deficiency 01/21/2017  . Vitamin D deficiency 10/15/2016  . Urinary incontinence 10/15/2016  . Limited mobility 05/27/2016  . Type 2 diabetes mellitus with hyperglycemia ([place]) 02/04/2016  . Sleep  terror disorder 12/14/2015  . Fecal incontinence 12/13/2015  . Back pain of lumbar region with sciatica 04/15/2015  . Spondylosis, cervical, with myelopathy 04/14/2015  . OSA (obstructive sleep apnea) 04/10/2013  . Peripheral neuropathy 03/12/2013  . Hearing loss 11/06/2012  . Seizure-like activity ([place]) 01/25/2012  . UNSTEADY GAIT 11/07/2010  . Alpha thalassemia ([place]) 01/26/2010  . Morbid obesity ([place]) 04/21/2008  . Anxiety and depression 04/21/2008  . Essential hypertension 04/21/2008  . GERD 04/21/2008  . Osteoarthritis of left knee 04/21/2008  . URINARY INCONTINENCE 04/21/2008    Rayetta Humphrey, PT CLT [phone] 09/29/2020, 1:29 PM  [facility] [address] [place], [place], [address] Phone: [phone]   Fax:  [phone]  Name: Laura Atkins MRN: [id number] Date of Birth: [birth year]

## 2020-09-30 ENCOUNTER — Telehealth: Payer: Self-pay

## 2020-09-30 ENCOUNTER — Other Ambulatory Visit: Payer: Self-pay

## 2020-09-30 DIAGNOSIS — F0390 Unspecified dementia without behavioral disturbance: Secondary | ICD-10-CM

## 2020-09-30 MED ORDER — UNABLE TO FIND: 1 refills | Status: DC

## 2020-09-30 NOTE — Telephone Encounter (Signed)
Order sent to [facility] for incontinence pads and supplies.

## 2020-09-30 NOTE — Telephone Encounter (Signed)
Patient requesting a script for incontinence supplies be sent to [facility]

## 2020-10-03 ENCOUNTER — Ambulatory Visit (HOSPITAL_COMMUNITY): Payer: Medicare HMO | Admitting: Physical Therapy

## 2020-10-03 ENCOUNTER — Encounter (HOSPITAL_COMMUNITY): Payer: Self-pay | Admitting: Physical Therapy

## 2020-10-03 ENCOUNTER — Other Ambulatory Visit: Payer: Self-pay

## 2020-10-03 DIAGNOSIS — R262 Difficulty in walking, not elsewhere classified: Secondary | ICD-10-CM

## 2020-10-03 DIAGNOSIS — R2689 Other abnormalities of gait and mobility: Secondary | ICD-10-CM

## 2020-10-03 DIAGNOSIS — M6281 Muscle weakness (generalized): Secondary | ICD-10-CM | POA: Diagnosis not present

## 2020-10-03 NOTE — Therapy (Signed)
[facility] [place], [place], [address] Phone: [phone]   Fax:  [phone]  Physical Therapy Treatment  Patient Details  Name: Laura Atkins MRN: [id number] Date of Birth: 08-16-1941 Referring Provider (PT): Pilar Jarvis   Encounter Date: 10/03/2020   PT End of Session - 10/03/20 1358    Visit Number 2    Number of Visits 12    Date for PT Re-Evaluation 10/29/20    Authorization Type [employer] approved 12 visits from 09/29/20 to 10/27/20    Authorization - Visit Number 2    Authorization - Number of Visits 12    Progress Note Due on Visit 10    PT Start Time 1400    PT Stop Time 1440    PT Time Calculation (min) 40 min           Past Medical History:  Diagnosis Date  . Alpha thalassemia trait 01/26/2010   02/2012: Nl CBC ex H&H-10.7/34.8, MCV-69   . Anemia   . Anxiety   . Anxiety and depression   . Cellulitis 05/09/2017  . Depression   . Diabetes mellitus   . Foot pain, right 04/30/2013  . GERD (gastroesophageal reflux disease)   . Headache(784.0)   . Hyperlipidemia   . Hypertension   . Iron deficiency 01/21/2017  . Microcytic anemia 01/26/2010   02/2012: Nl CBC ex H&H-10.7/34.8, MCV-69   . NECK PAIN, CHRONIC 10/21/2008   +chronic back pain    . Obesity   . Obstructive sleep apnea   . Osteoarthritis    Left knee; right shoulder; chronic neck and back pain  . Pruritus   . PVD (peripheral vascular disease) ([place]) 01/28/2014  . Seizures ([place])   . Shoulder pain, right 04/14/2015  . Tremor    This started months ago after her seizure progressing to very poor hand writing  . Urinary incontinence   . UTI (urinary tract infection) 01/18/2013    Past Surgical History:  Procedure Laterality Date  . ABDOMINAL HYSTERECTOMY    . BREAST EXCISIONAL BIOPSY     Left; cyst  . CATARACT EXTRACTION Right    12/2017  . CATARACT EXTRACTION W/ INTRAOCULAR LENS IMPLANT Left 09/07/2013  . CHOLECYSTECTOMY    . COLONOSCOPY    .  COLONOSCOPY N/A 07/20/2015   Procedure: COLONOSCOPY;  Surgeon: Rogene Houston, MD;  [facility];  Service: Endoscopy;  Laterality: N/A;  930  . EYE SURGERY Left 09/07/2013   cataract    There were no vitals filed for this visit.   Subjective Assessment - 10/03/20 1404    Subjective Patient present with husband for PT session. States she has pain at 6/10 in bilateral knees. States she has been trying to do her HEP at home but was confused by 1 exercise (sitting hip abduction/adduction) and was educated, detailed below.    Patient is accompained by: Family member    Pertinent History HTN, DM, AKI seizure like activity.    How long can you sit comfortably? no problem    How long can you stand comfortably? tested with no assisted device pt needs contact guard for 1:55    How long can you walk comfortably? less than two minutes    Patient Stated Goals to walk better    Currently in Pain? Yes    Pain Score 6     Pain Location Knee    Pain Orientation Right;Left    Pain Descriptors / Indicators Aching  Pain Type Chronic pain    Pain Onset More than a month ago                             [facility] Adult PT Treatment/Exercise - 10/03/20 0001      Knee/Hip Exercises: Standing   Other Standing Knee Exercises weight shifts: AP x30 sec B WBOS split stance RW for UE support, RL x1 min hover over RW for support    Other Standing Knee Exercises standing taps 4" box 2xx5 B using      Knee/Hip Exercises: Seated   Long Arc Quad Both;5 reps;2 sets   slight pain caudally   Heel Slides 2 sets;Both;5 reps   no resistance provided, increased grimacing   Ball Squeeze 3x5 5" squeeze    Marching Both;15 reps                  PT Education - 10/03/20 1511    Education Details HEP, POC, review old HEP, purpose of exercises, anatomy of leg.    Person(s) Educated Patient;Spouse    Methods Explanation;Demonstration    Comprehension Verbalized understanding             PT Short Term Goals - 09/29/20 1321      PT SHORT TERM GOAL #1   Title Patient will independently verbalize and demo initial HEP in order to improve B LE strength with ambulation and transfers.     Time 2    Status New    Target Date 10/13/20      PT SHORT TERM GOAL #2   Title PT will be able to complete 5 sit to stand in a 30 second period using UE assist to demonstrate improved power.    Time 2    Period Weeks    Status New      PT SHORT TERM GOAL #3   Title PT  will be able to ambulate 155ft with a RW in 2 minute time period to demonstrate reduce risk of falling.    Time 2    Period Weeks    Status New             PT Long Term Goals - 09/29/20 1323      PT LONG TERM GOAL #1   Title PT to be able to come sit to stand without the use of UE to demonstrate improved balance and strength.    Time 4    Period Weeks    Status New    Target Date 10/27/20      PT LONG TERM GOAL #2   Title PT to be able to single leg stance on both LE for 5 seconds to decrease risk of falling    Time 4    Period Weeks    Status New      PT LONG TERM GOAL #3   Title PT to be able to walk 280ft with a rolling walker in a 2 minute timeframe to allow pt to be able to make it to the restroom in time without having an accident    Time 4    Status New                 Plan - 10/03/20 1514    Clinical Impression Statement States that she has pain 6/10 today at start of session, max pain 9/10 during session during LAQ, and end of session pain 6/10. She reports she has  been doing some of her HEP at home, but was confused by 1 exercise, and PT reviewed all of old HEP with patient. Demo cont difficulty with knee pain, L>R, impacting ability to complete standing activities and activities requiring TKE B. Demo increased difficulty completing sit to stands without assist from husband, and without assist, required increased time and verbalized increased pain and difficulty. Demo good attitude  throughout session and stated that it did not hurt as much as she was expecting with PT alternating between sitting and standing activities.    Personal Factors and Comorbidities Fitness;Past/Current Experience;Comorbidity 3+    Comorbidities HTN, seizure like activity, DM, memory issues    Examination-Activity Limitations Caring for Others;Carry;Dressing;Lift;Locomotion Level;Stairs;Stand    Examination-Participation Restrictions Cleaning;Laundry;Meal Prep;Shop    Stability/Clinical Decision Making Evolving/Moderate complexity    Rehab Potential Good    PT Frequency 3x / week    PT Treatment/Interventions Gait training;Stair training;Functional mobility training;Therapeutic activities;Therapeutic exercise;Balance training;Neuromuscular re-education;Patient/family education    PT Next Visit Plan PT will benefit from coordination, strenthening and balance activity.  Begin heelraises, functional squat, marching, power rocking, gait focusing on increasing velocity and step thru technique. Balance and weight shifting in standing.    PT Home Exercise Plan 1/10: AP and lateral weight shifts           Patient will benefit from skilled therapeutic intervention in order to improve the following deficits and impairments:  Abnormal gait,Decreased activity tolerance,Decreased balance,Decreased endurance,Decreased mobility,Decreased range of motion,Difficulty walking,Decreased strength,Pain  Visit Diagnosis: Difficulty in walking, not elsewhere classified  Muscle weakness (generalized)  Other abnormalities of gait and mobility     Problem List Patient Active Problem List   Diagnosis Date Noted  . Dementia ([place]) 07/13/2020  . Cognitive deficits   . Infection   . Carotid bruit 05/26/2020  . Hyperlipidemia associated with type 2 diabetes mellitus ([place]) 12/08/2019  . Iron deficiency 01/21/2017  . Vitamin D deficiency 10/15/2016  . Urinary incontinence 10/15/2016  . Limited mobility 05/27/2016   . Type 2 diabetes mellitus with hyperglycemia ([place]) 02/04/2016  . Sleep terror disorder 12/14/2015  . Fecal incontinence 12/13/2015  . Back pain of lumbar region with sciatica 04/15/2015  . Spondylosis, cervical, with myelopathy 04/14/2015  . OSA (obstructive sleep apnea) 04/10/2013  . Peripheral neuropathy 03/12/2013  . Hearing loss 11/06/2012  . Seizure-like activity ([place]) 01/25/2012  . UNSTEADY GAIT 11/07/2010  . Alpha thalassemia ([place]) 01/26/2010  . Morbid obesity ([place]) 04/21/2008  . Anxiety and depression 04/21/2008  . Essential hypertension 04/21/2008  . GERD 04/21/2008  . Osteoarthritis of left knee 04/21/2008  . URINARY INCONTINENCE 04/21/2008   3:22 PM,10/03/20 Domenic Moras, PT, DPT Physical Therapist at [facility] [address] [place], [place], [address] Phone: [phone]   Fax:  [phone]  Name: Laura Atkins MRN: [id number] Date of Birth: 03-Nov-1940

## 2020-10-05 ENCOUNTER — Ambulatory Visit (HOSPITAL_COMMUNITY): Payer: Medicare HMO

## 2020-10-05 ENCOUNTER — Encounter (HOSPITAL_COMMUNITY): Payer: Self-pay

## 2020-10-05 ENCOUNTER — Other Ambulatory Visit: Payer: Self-pay

## 2020-10-05 DIAGNOSIS — R2689 Other abnormalities of gait and mobility: Secondary | ICD-10-CM | POA: Diagnosis not present

## 2020-10-05 DIAGNOSIS — R262 Difficulty in walking, not elsewhere classified: Secondary | ICD-10-CM

## 2020-10-05 DIAGNOSIS — M6281 Muscle weakness (generalized): Secondary | ICD-10-CM | POA: Diagnosis not present

## 2020-10-05 NOTE — Therapy (Signed)
[place], [place], [address] Phone: [phone]   Fax:  [phone]  Physical Therapy Treatment  Patient Details  Name: Laura Atkins MRN: [id number] Date of Birth: Feb 02, 1941 Referring Provider (PT): Pilar Jarvis   Encounter Date: 10/05/2020   PT End of Session - 10/05/20 0844    Visit Number 3    Number of Visits 12    Date for PT Re-Evaluation 10/29/20    Authorization Type [employer] approved 12 visits from 09/29/20 to 10/27/20    Authorization - Visit Number 3    Authorization - Number of Visits 12    Progress Note Due on Visit 10    PT Start Time 0835    PT Stop Time 0913    PT Time Calculation (min) 38 min    Equipment Utilized During Treatment Gait belt    Activity Tolerance Patient tolerated treatment well;Patient limited by pain;No increased pain;Patient limited by fatigue    Behavior During Therapy [facility] for tasks assessed/performed           Past Medical History:  Diagnosis Date  . Alpha thalassemia trait 01/26/2010   02/2012: Nl CBC ex H&H-10.7/34.8, MCV-69   . Anemia   . Anxiety   . Anxiety and depression   . Cellulitis 05/09/2017  . Depression   . Diabetes mellitus   . Foot pain, right 04/30/2013  . GERD (gastroesophageal reflux disease)   . Headache(784.0)   . Hyperlipidemia   . Hypertension   . Iron deficiency 01/21/2017  . Microcytic anemia 01/26/2010   02/2012: Nl CBC ex H&H-10.7/34.8, MCV-69   . NECK PAIN, CHRONIC 10/21/2008   +chronic back pain    . Obesity   . Obstructive sleep apnea   . Osteoarthritis    Left knee; right shoulder; chronic neck and back pain  . Pruritus   . PVD (peripheral vascular disease) ([place]) 01/28/2014  . Seizures ([place])   . Shoulder pain, right 04/14/2015  . Tremor    This started months ago after her seizure progressing to very poor hand writing  . Urinary incontinence   . UTI (urinary tract infection) 01/18/2013    Past Surgical History:  Procedure Laterality  Date  . ABDOMINAL HYSTERECTOMY    . BREAST EXCISIONAL BIOPSY     Left; cyst  . CATARACT EXTRACTION Right    12/2017  . CATARACT EXTRACTION W/ INTRAOCULAR LENS IMPLANT Left 09/07/2013  . CHOLECYSTECTOMY    . COLONOSCOPY    . COLONOSCOPY N/A 07/20/2015   Procedure: COLONOSCOPY;  Surgeon: Rogene Houston, MD;  [facility];  Service: Endoscopy;  Laterality: N/A;  930  . EYE SURGERY Left 09/07/2013   cataract    There were no vitals filed for this visit.   Subjective Assessment - 10/05/20 0842    Subjective Pt stated her joints are stiff today.    Patient is accompained by: Family member    Pertinent History HTN, DM, AKI seizure like activity.    Currently in Pain? Yes    Pain Score 6     Pain Location Knee    Pain Orientation Right;Left    Pain Descriptors / Indicators Aching;Tightness    Pain Type Chronic pain    Pain Onset More than a month ago    Aggravating Factors  weight bearing    Pain Relieving Factors medication and rubbing  [facility] Adult PT Treatment/Exercise - 10/05/20 0001      Exercises   Exercises Knee/Hip      Knee/Hip Exercises: Standing   Heel Raises 10 reps    Hip Flexion 10 reps;Knee bent    Hip Flexion Limitations alternating march 3-5" holds with UE support    Functional Squat 5 reps    Functional Squat Limitations minisquat with UE support    Gait Training BIG steps wiht RW 30 ft    Other Standing Knee Exercises weight shifts: AP x30 sec B WBOS split stance RW for UE support, RL x1 min hover over RW for support    Other Standing Knee Exercises standing taps 4" box 2xx5 B using      Knee/Hip Exercises: Seated   Long Arc Quad Both;10 reps    Sit to [employer] 5 reps;with UE support                    PT Short Term Goals - 09/29/20 1321      PT SHORT TERM GOAL #1   Title Patient will independently verbalize and demo initial HEP in order to improve B LE strength with ambulation and  transfers.     Time 2    Status New    Target Date 10/13/20      PT SHORT TERM GOAL #2   Title PT will be able to complete 5 sit to stand in a 30 second period using UE assist to demonstrate improved power.    Time 2    Period Weeks    Status New      PT SHORT TERM GOAL #3   Title PT  will be able to ambulate 129ft with a RW in 2 minute time period to demonstrate reduce risk of falling.    Time 2    Period Weeks    Status New             PT Long Term Goals - 09/29/20 1323      PT LONG TERM GOAL #1   Title PT to be able to come sit to stand without the use of UE to demonstrate improved balance and strength.    Time 4    Period Weeks    Status New    Target Date 10/27/20      PT LONG TERM GOAL #2   Title PT to be able to single leg stance on both LE for 5 seconds to decrease risk of falling    Time 4    Period Weeks    Status New      PT LONG TERM GOAL #3   Title PT to be able to walk 220ft with a rolling walker in a 2 minute timeframe to allow pt to be able to make it to the restroom in time without having an accident    Time 4    Status New                 Plan - 10/05/20 0930    Clinical Impression Statement Session focus on weight shifting to improve gait mechanics and functional strengthening.  Pt limited by knee pain and fatigue with activities required seated rest breaks through session.  Cueing for mechanics to improve weight shifting with sit to stand, unable to complete wihtout assistance.  Added closed chain exercises for functional strengthening, UE support required and cueing for form with new exercises.  Demonstrates very low cadence with gait, cueing to increase stride  length to normalize gait mechanics with RW.    Personal Factors and Comorbidities Fitness;Past/Current Experience;Comorbidity 3+    Comorbidities HTN, seizure like activity, DM, memory issues    Examination-Activity Limitations Caring for Others;Carry;Dressing;Lift;Locomotion  Level;Stairs;Stand    Examination-Participation Restrictions Cleaning;Laundry;Meal Prep;Shop    Stability/Clinical Decision Making Evolving/Moderate complexity    Clinical Decision Making Moderate    Rehab Potential Good    PT Frequency 3x / week    PT Duration 4 weeks    PT Treatment/Interventions Gait training;Stair training;Functional mobility training;Therapeutic activities;Therapeutic exercise;Balance training;Neuromuscular re-education;Patient/family education    PT Next Visit Plan PT will benefit from coordination, strenthening and balance activity.  Continue heelraises, functional squat, marching.  Begin power rocking, gait focusing on increasing velocity and step thru technique. Balance and weight shifting in standing.    PT Home Exercise Plan 1/10: AP and lateral weight shifts           Patient will benefit from skilled therapeutic intervention in order to improve the following deficits and impairments:  Abnormal gait,Decreased activity tolerance,Decreased balance,Decreased endurance,Decreased mobility,Decreased range of motion,Difficulty walking,Decreased strength,Pain  Visit Diagnosis: Difficulty in walking, not elsewhere classified  Muscle weakness (generalized)  Other abnormalities of gait and mobility     Problem List Patient Active Problem List   Diagnosis Date Noted  . Dementia ([place]) 07/13/2020  . Cognitive deficits   . Infection   . Carotid bruit 05/26/2020  . Hyperlipidemia associated with type 2 diabetes mellitus ([place]) 12/08/2019  . Iron deficiency 01/21/2017  . Vitamin D deficiency 10/15/2016  . Urinary incontinence 10/15/2016  . Limited mobility 05/27/2016  . Type 2 diabetes mellitus with hyperglycemia ([place]) 02/04/2016  . Sleep terror disorder 12/14/2015  . Fecal incontinence 12/13/2015  . Back pain of lumbar region with sciatica 04/15/2015  . Spondylosis, cervical, with myelopathy 04/14/2015  . OSA (obstructive sleep apnea) 04/10/2013  . Peripheral  neuropathy 03/12/2013  . Hearing loss 11/06/2012  . Seizure-like activity ([place]) 01/25/2012  . UNSTEADY GAIT 11/07/2010  . Alpha thalassemia ([place]) 01/26/2010  . Morbid obesity ([place]) 04/21/2008  . Anxiety and depression 04/21/2008  . Essential hypertension 04/21/2008  . GERD 04/21/2008  . Osteoarthritis of left knee 04/21/2008  . URINARY INCONTINENCE 04/21/2008   Ihor Austin, LPTA/CLT; CBIS [phone]  Aldona Lento 10/05/2020, 9:41 AM  [place], [place], [address] Phone: [phone]   Fax:  [phone]  Name: Laura Atkins MRN: [id number] Date of Birth: [birth year]

## 2020-10-06 ENCOUNTER — Other Ambulatory Visit: Payer: Self-pay

## 2020-10-06 DIAGNOSIS — R32 Unspecified urinary incontinence: Secondary | ICD-10-CM

## 2020-10-06 DIAGNOSIS — N39498 Other specified urinary incontinence: Secondary | ICD-10-CM

## 2020-10-07 ENCOUNTER — Other Ambulatory Visit: Payer: Self-pay

## 2020-10-07 ENCOUNTER — Ambulatory Visit (HOSPITAL_COMMUNITY): Payer: Medicare HMO | Admitting: Physical Therapy

## 2020-10-07 ENCOUNTER — Encounter (HOSPITAL_COMMUNITY): Payer: Self-pay | Admitting: Physical Therapy

## 2020-10-07 DIAGNOSIS — M6281 Muscle weakness (generalized): Secondary | ICD-10-CM

## 2020-10-07 DIAGNOSIS — R2689 Other abnormalities of gait and mobility: Secondary | ICD-10-CM | POA: Diagnosis not present

## 2020-10-07 DIAGNOSIS — R262 Difficulty in walking, not elsewhere classified: Secondary | ICD-10-CM

## 2020-10-07 LAB — URINALYSIS, ROUTINE W REFLEX MICROSCOPIC
Bilirubin, UA: NEGATIVE
Glucose, UA: NEGATIVE
Ketones, UA: NEGATIVE
Leukocytes,UA: NEGATIVE
Nitrite, UA: NEGATIVE
Protein,UA: NEGATIVE
RBC, UA: NEGATIVE
Specific Gravity, UA: 1.008 (ref 1.005–1.030)
Urobilinogen, Ur: 0.2 mg/dL (ref 0.2–1.0)
pH, UA: 6.5 (ref 5.0–7.5)

## 2020-10-07 NOTE — Progress Notes (Signed)
U/A was negative for UTI. No need to start meds.

## 2020-10-07 NOTE — Therapy (Signed)
[place], [place], [address] Phone: [phone]   Fax:  [phone]  Physical Therapy Treatment  Patient Details  Name: Laura Atkins MRN: [id number] Date of Birth: 01-May-1941 Referring Provider (PT): Pilar Jarvis   Encounter Date: 10/07/2020   PT End of Session - 10/07/20 1627    Visit Number 4    Number of Visits 12    Date for PT Re-Evaluation 10/29/20    Authorization Type [employer] approved 12 visits from 09/29/20 to 10/27/20    Authorization - Visit Number 4    Authorization - Number of Visits 12    Progress Note Due on Visit 10    PT Start Time 8366    PT Stop Time 1507    PT Time Calculation (min) 44 min    Equipment Utilized During Treatment Gait belt    Activity Tolerance Patient tolerated treatment well;Patient limited by pain;No increased pain;Patient limited by fatigue    Behavior During Therapy [facility] for tasks assessed/performed           Past Medical History:  Diagnosis Date  . Alpha thalassemia trait 01/26/2010   02/2012: Nl CBC ex H&H-10.7/34.8, MCV-69   . Anemia   . Anxiety   . Anxiety and depression   . Cellulitis 05/09/2017  . Depression   . Diabetes mellitus   . Foot pain, right 04/30/2013  . GERD (gastroesophageal reflux disease)   . Headache(784.0)   . Hyperlipidemia   . Hypertension   . Iron deficiency 01/21/2017  . Microcytic anemia 01/26/2010   02/2012: Nl CBC ex H&H-10.7/34.8, MCV-69   . NECK PAIN, CHRONIC 10/21/2008   +chronic back pain    . Obesity   . Obstructive sleep apnea   . Osteoarthritis    Left knee; right shoulder; chronic neck and back pain  . Pruritus   . PVD (peripheral vascular disease) ([place]) 01/28/2014  . Seizures ([place])   . Shoulder pain, right 04/14/2015  . Tremor    This started months ago after her seizure progressing to very poor hand writing  . Urinary incontinence   . UTI (urinary tract infection) 01/18/2013    Past Surgical History:  Procedure Laterality  Date  . ABDOMINAL HYSTERECTOMY    . BREAST EXCISIONAL BIOPSY     Left; cyst  . CATARACT EXTRACTION Right    12/2017  . CATARACT EXTRACTION W/ INTRAOCULAR LENS IMPLANT Left 09/07/2013  . CHOLECYSTECTOMY    . COLONOSCOPY    . COLONOSCOPY N/A 07/20/2015   Procedure: COLONOSCOPY;  Surgeon: Rogene Houston, MD;  [facility];  Service: Endoscopy;  Laterality: N/A;  930  . EYE SURGERY Left 09/07/2013   cataract    There were no vitals filed for this visit.   Subjective Assessment - 10/07/20 1427    Subjective PT has been doing her HEP    Patient is accompained by: Family member    Pertinent History HTN, DM, AKI seizure like activity.    How long can you sit comfortably? no problem    How long can you stand comfortably? tested with no assisted device pt needs contact guard for 1:55    How long can you walk comfortably? less than two minutes    Currently in Pain? Yes    Pain Score 4     Pain Location Knee    Pain Orientation Right;Left    Pain Descriptors / Indicators Aching    Pain Type Chronic pain  Pain Onset More than a month ago    Aggravating Factors  wt bearing    Pain Relieving Factors meds                             [facility] Adult PT Treatment/Exercise - 10/07/20 0001      Exercises   Exercises Knee/Hip      Knee/Hip Exercises: Standing   Heel Raises Both;15 reps    Functional Squat 10 reps    Gait Training Rocking Rt to LEft lifting legs off the floor with RW    Other Standing Knee Exercises sisde step x 1 rt    Other Standing Knee Exercises standing taps 4" box x10 w/ B UE support on walker      Knee/Hip Exercises: Seated   Long Arc Quad Both;10 reps   with table elevated and flexion opposite knee   Sit to [place] 10 reps;without UE support   raised table     Knee/Hip Exercises: Supine   Bridges with Ball Squeeze Both;10 reps    Other Supine Knee/Hip Exercises dead bug x10   to improve coordination                   PT  Short Term Goals - 10/07/20 1633      PT SHORT TERM GOAL #1   Title Patient will independently verbalize and demo initial HEP in order to improve B LE strength with ambulation and transfers.     Time 2    Status On-going    Target Date 10/13/20      PT SHORT TERM GOAL #2   Title PT will be able to complete 5 sit to stand in a 30 second period using UE assist to demonstrate improved power.    Time 2    Period Weeks    Status On-going      PT SHORT TERM GOAL #3   Title PT  will be able to ambulate 168ft with a RW in 2 minute time period to demonstrate reduce risk of falling.    Time 2    Period Weeks    Status On-going             PT Long Term Goals - 10/07/20 1633      PT LONG TERM GOAL #1   Title PT to be able to come sit to stand without the use of UE to demonstrate improved balance and strength.    Time 4    Period Weeks    Status On-going      PT LONG TERM GOAL #2   Title PT to be able to single leg stance on both LE for 5 seconds to decrease risk of falling    Time 4    Period Weeks    Status On-going      PT LONG TERM GOAL #3   Title PT to be able to walk 224ft with a rolling walker in a 2 minute timeframe to allow pt to be able to make it to the restroom in time without having an accident    Time 4    Status On-going      PT LONG TERM GOAL #4   Status On-going      PT LONG TERM GOAL #5   Status On-going                 Plan - 10/07/20 1628    Clinical Impression  Statement Continued to work on wt shifting and coordination both in supine and standing .  Added dead bug for both coordination as well as wt shifting.  PT needs constanct cuing to look up while completing exercises as well as to engage her core.  Multiple rests breaks needed throughout session.  Updated pt HEP.    Personal Factors and Comorbidities Fitness;Past/Current Experience;Comorbidity 3+    Comorbidities HTN, seizure like activity, DM, memory issues    Examination-Activity  Limitations Caring for Others;Carry;Dressing;Lift;Locomotion Level;Stairs;Stand    Examination-Participation Restrictions Cleaning;Laundry;Meal Prep;Shop    Stability/Clinical Decision Making Evolving/Moderate complexity    Rehab Potential Good    PT Frequency 3x / week    PT Duration 4 weeks    PT Treatment/Interventions Gait training;Stair training;Functional mobility training;Therapeutic activities;Therapeutic exercise;Balance training;Neuromuscular re-education;Patient/family education    PT Next Visit Plan gait focusing on increasing velocity and step thru technique. Balance and weight shifting in standing.    PT Home Exercise Plan 1/10: AP and lateral weight shifts; 1/14: LAQ, sit to stand at kitchen table, bridge, dead bug and sidelying abduction           Patient will benefit from skilled therapeutic intervention in order to improve the following deficits and impairments:  Abnormal gait,Decreased activity tolerance,Decreased balance,Decreased endurance,Decreased mobility,Decreased range of motion,Difficulty walking,Decreased strength,Pain  Visit Diagnosis: Difficulty in walking, not elsewhere classified  Muscle weakness (generalized)  Other abnormalities of gait and mobility     Problem List Patient Active Problem List   Diagnosis Date Noted  . Dementia ([place]) 07/13/2020  . Cognitive deficits   . Infection   . Carotid bruit 05/26/2020  . Hyperlipidemia associated with type 2 diabetes mellitus ([place]) 12/08/2019  . Iron deficiency 01/21/2017  . Vitamin D deficiency 10/15/2016  . Urinary incontinence 10/15/2016  . Limited mobility 05/27/2016  . Type 2 diabetes mellitus with hyperglycemia ([place]) 02/04/2016  . Sleep terror disorder 12/14/2015  . Fecal incontinence 12/13/2015  . Back pain of lumbar region with sciatica 04/15/2015  . Spondylosis, cervical, with myelopathy 04/14/2015  . OSA (obstructive sleep apnea) 04/10/2013  . Peripheral neuropathy 03/12/2013  . Hearing  loss 11/06/2012  . Seizure-like activity ([place]) 01/25/2012  . UNSTEADY GAIT 11/07/2010  . Alpha thalassemia ([place]) 01/26/2010  . Morbid obesity ([place]) 04/21/2008  . Anxiety and depression 04/21/2008  . Essential hypertension 04/21/2008  . GERD 04/21/2008  . Osteoarthritis of left knee 04/21/2008  . URINARY INCONTINENCE 04/21/2008    Rayetta Humphrey, PT CLT [phone] 10/07/2020, 4:34 PM  [place] [address] [place], [place], [address] Phone: [phone]   Fax:  [phone]  Name: Laura Atkins MRN: [id number] Date of Birth: 07/26/1941

## 2020-10-09 LAB — URINE CULTURE

## 2020-10-10 ENCOUNTER — Ambulatory Visit (HOSPITAL_COMMUNITY): Payer: Medicare HMO | Admitting: Physical Therapy

## 2020-10-12 ENCOUNTER — Other Ambulatory Visit: Payer: Self-pay

## 2020-10-12 ENCOUNTER — Ambulatory Visit (HOSPITAL_COMMUNITY): Payer: Medicare HMO | Admitting: Physical Therapy

## 2020-10-12 DIAGNOSIS — R2689 Other abnormalities of gait and mobility: Secondary | ICD-10-CM | POA: Diagnosis not present

## 2020-10-12 DIAGNOSIS — M6281 Muscle weakness (generalized): Secondary | ICD-10-CM | POA: Diagnosis not present

## 2020-10-12 DIAGNOSIS — R262 Difficulty in walking, not elsewhere classified: Secondary | ICD-10-CM

## 2020-10-12 NOTE — Therapy (Addendum)
[place], [place], [address] Phone: [phone]   Fax:  [phone]  Physical Therapy Treatment  Patient Details  Name: Laura Atkins MRN: [id number] Date of Birth: 1941-06-19 Referring Provider (PT): Pilar Jarvis   Encounter Date: 10/12/2020   PT End of Session - 10/12/20 1549    Visit Number 5    Number of Visits 12    Date for PT Re-Evaluation 10/29/20    Authorization Type [employer] approved 12 visits from 09/29/20 to 10/27/20    Authorization - Visit Number 5    Authorization - Number of Visits 12    Progress Note Due on Visit 10    PT Start Time [id number]    PT Stop Time 1530    PT Time Calculation (min) 45 min    Equipment Utilized During Treatment Gait belt    Activity Tolerance Patient tolerated treatment well;Patient limited by pain;No increased pain;Patient limited by fatigue    Behavior During Therapy [facility] for tasks assessed/performed           Past Medical History:  Diagnosis Date  . Alpha thalassemia trait 01/26/2010   02/2012: Nl CBC ex H&H-10.7/34.8, MCV-69   . Anemia   . Anxiety   . Anxiety and depression   . Cellulitis 05/09/2017  . Depression   . Diabetes mellitus   . Foot pain, right 04/30/2013  . GERD (gastroesophageal reflux disease)   . Headache(784.0)   . Hyperlipidemia   . Hypertension   . Iron deficiency 01/21/2017  . Microcytic anemia 01/26/2010   02/2012: Nl CBC ex H&H-10.7/34.8, MCV-69   . NECK PAIN, CHRONIC 10/21/2008   +chronic back pain    . Obesity   . Obstructive sleep apnea   . Osteoarthritis    Left knee; right shoulder; chronic neck and back pain  . Pruritus   . PVD (peripheral vascular disease) ([place]) 01/28/2014  . Seizures ([place])   . Shoulder pain, right 04/14/2015  . Tremor    This started months ago after her seizure progressing to very poor hand writing  . Urinary incontinence   . UTI (urinary tract infection) 01/18/2013    Past Surgical History:  Procedure Laterality  Date  . ABDOMINAL HYSTERECTOMY    . BREAST EXCISIONAL BIOPSY     Left; cyst  . CATARACT EXTRACTION Right    12/2017  . CATARACT EXTRACTION W/ INTRAOCULAR LENS IMPLANT Left 09/07/2013  . CHOLECYSTECTOMY    . COLONOSCOPY    . COLONOSCOPY N/A 07/20/2015   Procedure: COLONOSCOPY;  Surgeon: Rogene Houston, MD;  [facility];  Service: Endoscopy;  Laterality: N/A;  930  . EYE SURGERY Left 09/07/2013   cataract    There were no vitals filed for this visit.   Subjective Assessment - 10/12/20 1511    Subjective Pt accompanied by spouse.  STates he stays on her to do her exercises.  Pt reports her knees always hurt, Lt is worse    Currently in Pain? Yes    Pain Score 5     Pain Orientation Right;Left    Pain Descriptors / Indicators Aching;Sore                             [facility] Adult PT Treatment/Exercise - 10/12/20 0001      Knee/Hip Exercises: Standing   Heel Raises Both;15 reps    Heel Raises Limitations toeraises 10X    Knee  Flexion Limitations 15X alternating standing march    Hip Flexion 10 reps;Knee bent    Hip Flexion Limitations alternating march 3-5" holds with UE support    Functional Squat 15 reps    Functional Squat Limitations minisquat with UE support    SLS modified tandem stance 30" each    Gait Training Rocking Rt to LEft lifting legs off the floor with RW    Other Standing Knee Exercises sisde step x 1 rt    Other Standing Knee Exercises standing taps 4" box x10 w/ B UE support on walker      Knee/Hip Exercises: Seated   Long Arc Quad Both;10 reps                    PT Short Term Goals - 10/07/20 1633      PT SHORT TERM GOAL #1   Title Patient will independently verbalize and demo initial HEP in order to improve B LE strength with ambulation and transfers.     Time 2    Status On-going    Target Date 10/13/20      PT SHORT TERM GOAL #2   Title PT will be able to complete 5 sit to stand in a 30 second period using  UE assist to demonstrate improved power.    Time 2    Period Weeks    Status On-going      PT SHORT TERM GOAL #3   Title PT  will be able to ambulate 175f with a RW in 2 minute time period to demonstrate reduce risk of falling.    Time 2    Period Weeks    Status On-going             PT Long Term Goals - 10/07/20 1633      PT LONG TERM GOAL #1   Title PT to be able to come sit to stand without the use of UE to demonstrate improved balance and strength.    Time 4    Period Weeks    Status On-going      PT LONG TERM GOAL #2   Title PT to be able to single leg stance on both LE for 5 seconds to decrease risk of falling    Time 4    Period Weeks    Status On-going      PT LONG TERM GOAL #3   Title PT to be able to walk 2039fwith a rolling walker in a 2 minute timeframe to allow pt to be able to make it to the restroom in time without having an accident    Time 4    Status On-going      PT LONG TERM GOAL #4   Status On-going      PT LONG TERM GOAL #5   Status On-going                    Clinical Impression Statement Continued to work on alternating LE movements with coordination.  Began static balance challenge this session, however in modified form as she could not assume a normal tandem stance. PT requires constant cues for forward gaze while completing exercises as well as to engage her core.  Multiple rests breaks needed throughout session.     Personal Factors and Comorbidities Fitness;Past/Current Experience;Comorbidity 3+   Comorbidities HTN, seizure like activity, DM, memory issues   Examination-Activity Limitations Caring for Others;Carry;Dressing;Lift;Locomotion Level;Stairs;Stand   Examination-Participation Restrictions Cleaning;Laundry;Meal Prep;Shop  Stability/Clinical Decision Making Evolving/Moderate complexity   Rehab Potential Good   PT Frequency 3x / week   PT Duration 4 weeks   PT Treatment/Interventions Gait training;Stair  training;Functional mobility training;Therapeutic activities;Therapeutic exercise;Balance training;Neuromuscular re-education;Patient/family education   PT Next Visit Plan gait focusing on increasing velocity and step thru technique. Balance and weight shifting in standing.   PT Home Exercise Plan 1/10: AP and lateral weight shifts; 1/14: LAQ, sit to stand at kitchen table, bridge, dead bug and sidelying abduction         Patient will benefit from skilled therapeutic intervention in order to improve the following deficits and impairments:     Visit Diagnosis: Difficulty in walking, not elsewhere classified  Muscle weakness (generalized)  Other abnormalities of gait and mobility     Problem List Patient Active Problem List   Diagnosis Date Noted  . Dementia ([place]) 07/13/2020  . Cognitive deficits   . Infection   . Carotid bruit 05/26/2020  . Hyperlipidemia associated with type 2 diabetes mellitus ([place]) 12/08/2019  . Iron deficiency 01/21/2017  . Vitamin D deficiency 10/15/2016  . Urinary incontinence 10/15/2016  . Limited mobility 05/27/2016  . Type 2 diabetes mellitus with hyperglycemia ([place]) 02/04/2016  . Sleep terror disorder 12/14/2015  . Fecal incontinence 12/13/2015  . Back pain of lumbar region with sciatica 04/15/2015  . Spondylosis, cervical, with myelopathy 04/14/2015  . OSA (obstructive sleep apnea) 04/10/2013  . Peripheral neuropathy 03/12/2013  . Hearing loss 11/06/2012  . Seizure-like activity ([place]) 01/25/2012  . UNSTEADY GAIT 11/07/2010  . Alpha thalassemia ([place]) 01/26/2010  . Morbid obesity ([place]) 04/21/2008  . Anxiety and depression 04/21/2008  . Essential hypertension 04/21/2008  . GERD 04/21/2008  . Osteoarthritis of left knee 04/21/2008  . URINARY INCONTINENCE 04/21/2008   Teena Irani, PTA/CLT [phone]  Teena Irani 10/12/2020, 3:50 PM  [facility] [address] [place], [place],  [address] Phone: [phone]   Fax:  [phone]  Name: Laura Atkins MRN: [id number] Date of Birth: 08/24/1941

## 2020-10-13 ENCOUNTER — Ambulatory Visit: Payer: Medicare HMO | Admitting: Endocrinology

## 2020-10-13 ENCOUNTER — Telehealth: Payer: Self-pay

## 2020-10-13 NOTE — Telephone Encounter (Signed)
No. That message from Dr. Moshe Cipro was in error. The new labs were negative for UTI. I talked with Dr. Moshe Cipro about that in Teams chat.

## 2020-10-13 NOTE — Telephone Encounter (Signed)
Pt informed

## 2020-10-13 NOTE — Telephone Encounter (Signed)
Pt called back about her urine results. In the last message, Dr.Simpson said pt has recurrent issues and abt therapy should possibly be sent for sx's. Would you like to send her something, since she is still having sx's.

## 2020-10-14 ENCOUNTER — Ambulatory Visit (HOSPITAL_COMMUNITY): Payer: Medicare HMO

## 2020-10-17 ENCOUNTER — Ambulatory Visit (HOSPITAL_COMMUNITY): Payer: Medicare HMO | Admitting: Physical Therapy

## 2020-10-17 ENCOUNTER — Encounter (HOSPITAL_COMMUNITY): Payer: Self-pay | Admitting: Physical Therapy

## 2020-10-17 ENCOUNTER — Telehealth: Payer: Self-pay

## 2020-10-17 ENCOUNTER — Other Ambulatory Visit: Payer: Self-pay

## 2020-10-17 DIAGNOSIS — R2689 Other abnormalities of gait and mobility: Secondary | ICD-10-CM

## 2020-10-17 DIAGNOSIS — M6281 Muscle weakness (generalized): Secondary | ICD-10-CM

## 2020-10-17 DIAGNOSIS — R262 Difficulty in walking, not elsewhere classified: Secondary | ICD-10-CM

## 2020-10-17 NOTE — Therapy (Signed)
[place], [place], [address] Phone: [phone]   Fax:  [phone]  Physical Therapy Treatment  Patient Details  Name: Laura Atkins MRN: [id number] Date of Birth: December 15, 1940 Referring Provider (PT): Pilar Jarvis   Encounter Date: 10/17/2020   PT End of Session - 10/17/20 1540    Visit Number 6    Number of Visits 12    Date for PT Re-Evaluation 10/29/20    Authorization Type [employer] approved 12 visits from 09/29/20 to 10/27/20    Authorization - Visit Number 6    Authorization - Number of Visits 12    Progress Note Due on Visit 10    PT Start Time [id number]   pt late to session   PT Stop Time 1610    PT Time Calculation (min) 28 min    Equipment Utilized During Treatment Gait belt    Activity Tolerance Patient tolerated treatment well;Patient limited by pain;No increased pain;Patient limited by fatigue    Behavior During Therapy [facility] for tasks assessed/performed           Past Medical History:  Diagnosis Date  . Alpha thalassemia trait 01/26/2010   02/2012: Nl CBC ex H&H-10.7/34.8, MCV-69   . Anemia   . Anxiety   . Anxiety and depression   . Cellulitis 05/09/2017  . Depression   . Diabetes mellitus   . Foot pain, right 04/30/2013  . GERD (gastroesophageal reflux disease)   . Headache(784.0)   . Hyperlipidemia   . Hypertension   . Iron deficiency 01/21/2017  . Microcytic anemia 01/26/2010   02/2012: Nl CBC ex H&H-10.7/34.8, MCV-69   . NECK PAIN, CHRONIC 10/21/2008   +chronic back pain    . Obesity   . Obstructive sleep apnea   . Osteoarthritis    Left knee; right shoulder; chronic neck and back pain  . Pruritus   . PVD (peripheral vascular disease) ([place]) 01/28/2014  . Seizures ([place])   . Shoulder pain, right 04/14/2015  . Tremor    This started months ago after her seizure progressing to very poor hand writing  . Urinary incontinence   . UTI (urinary tract infection) 01/18/2013    Past Surgical History:   Procedure Laterality Date  . ABDOMINAL HYSTERECTOMY    . BREAST EXCISIONAL BIOPSY     Left; cyst  . CATARACT EXTRACTION Right    12/2017  . CATARACT EXTRACTION W/ INTRAOCULAR LENS IMPLANT Left 09/07/2013  . CHOLECYSTECTOMY    . COLONOSCOPY    . COLONOSCOPY N/A 07/20/2015   Procedure: COLONOSCOPY;  Surgeon: Rogene Houston, MD;  Location[facility];  Service: Endoscopy;  Laterality: N/A;  930  . EYE SURGERY Left 09/07/2013   cataract    There were no vitals filed for this visit.   Subjective Assessment - 10/17/20 1617    Subjective Reports same pain as always.    Currently in Pain? Yes    Pain Location Knee    Pain Orientation Right;Left              [facility] PT Assessment - 10/17/20 0001      Assessment   Medical Diagnosis Difficulty walking    Referring Provider (PT) Pilar Jarvis    Onset Date/Surgical Date 05/27/20                         [facility] Adult PT Treatment/Exercise - 10/17/20 0001      Knee/Hip Exercises:  Standing   Other Standing Knee Exercises standing straight posture- 2 minutes      Knee/Hip Exercises: Seated   Other Seated Knee/Hip Exercises quad set with TKE 20" holds x5 B    Sit to Sand 3 sets;5 reps;without UE support   blue cushion                 PT Education - 10/17/20 1601    Education Details on anatomy, posture, standing and HEP.    Person(s) Educated Patient    Methods Explanation    Comprehension Verbalized understanding            PT Short Term Goals - 10/07/20 1633      PT SHORT TERM GOAL #1   Title Patient will independently verbalize and demo initial HEP in order to improve B LE strength with ambulation and transfers.     Time 2    Status On-going    Target Date 10/13/20      PT SHORT TERM GOAL #2   Title PT will be able to complete 5 sit to stand in a 30 second period using UE assist to demonstrate improved power.    Time 2    Period Weeks    Status On-going      PT SHORT TERM GOAL #3   Title  PT  will be able to ambulate 162f with a RW in 2 minute time period to demonstrate reduce risk of falling.    Time 2    Period Weeks    Status On-going             PT Long Term Goals - 10/07/20 1633      PT LONG TERM GOAL #1   Title PT to be able to come sit to stand without the use of UE to demonstrate improved balance and strength.    Time 4    Period Weeks    Status On-going      PT LONG TERM GOAL #2   Title PT to be able to single leg stance on both LE for 5 seconds to decrease risk of falling    Time 4    Period Weeks    Status On-going      PT LONG TERM GOAL #3   Title PT to be able to walk 2067fwith a rolling walker in a 2 minute timeframe to allow pt to be able to make it to the restroom in time without having an accident    Time 4    Status On-going      PT LONG TERM GOAL #4   Status On-going      PT LONG TERM GOAL #5   Status On-going                 Plan - 10/17/20 1540    Clinical Impression Statement Session limited secondary to late arrival. Focused on sit to stands and knee extension. No increase in pain noted but patient required constant motivation for exercises. She also required use of one hand for sit to stand on first sit to stand secondary to fear of falling but able to perform with no hands after first rep of each set.  Educated patient on straightening knees and bringing hips forward with standing as patient stands in crouch gait, this improved with cues but hip flexors limiting posture at this time. Will continue with current POC    Personal Factors and Comorbidities Fitness;Past/Current Experience;Comorbidity 3+    Comorbidities  HTN, seizure like activity, DM, memory issues    Examination-Activity Limitations Caring for Others;Carry;Dressing;Lift;Locomotion Level;Stairs;Stand    Examination-Participation Restrictions Cleaning;Laundry;Meal Prep;Shop    Stability/Clinical Decision Making Evolving/Moderate complexity    Rehab Potential Good     PT Frequency 3x / week    PT Duration 4 weeks    PT Treatment/Interventions Gait training;Stair training;Functional mobility training;Therapeutic activities;Therapeutic exercise;Balance training;Neuromuscular re-education;Patient/family education    PT Next Visit Plan gait focusing on increasing velocity and step thru technique. Balance and weight shifting in standing.    PT Home Exercise Plan 1/10: AP and lateral weight shifts; 1/14: LAQ, sit to stand at kitchen table, bridge, dead bug and sidelying abduction; STS, hamstring stretch with quad set           Patient will benefit from skilled therapeutic intervention in order to improve the following deficits and impairments:  Abnormal gait,Decreased activity tolerance,Decreased balance,Decreased endurance,Decreased mobility,Decreased range of motion,Difficulty walking,Decreased strength,Pain  Visit Diagnosis: Difficulty in walking, not elsewhere classified  Muscle weakness (generalized)  Other abnormalities of gait and mobility     Problem List Patient Active Problem List   Diagnosis Date Noted  . Dementia ([place]) 07/13/2020  . Cognitive deficits   . Infection   . Carotid bruit 05/26/2020  . Hyperlipidemia associated with type 2 diabetes mellitus ([place]) 12/08/2019  . Iron deficiency 01/21/2017  . Vitamin D deficiency 10/15/2016  . Urinary incontinence 10/15/2016  . Limited mobility 05/27/2016  . Type 2 diabetes mellitus with hyperglycemia ([place]) 02/04/2016  . Sleep terror disorder 12/14/2015  . Fecal incontinence 12/13/2015  . Back pain of lumbar region with sciatica 04/15/2015  . Spondylosis, cervical, with myelopathy 04/14/2015  . OSA (obstructive sleep apnea) 04/10/2013  . Peripheral neuropathy 03/12/2013  . Hearing loss 11/06/2012  . Seizure-like activity ([place]) 01/25/2012  . UNSTEADY GAIT 11/07/2010  . Alpha thalassemia ([place]) 01/26/2010  . Morbid obesity ([place]) 04/21/2008  . Anxiety and depression 04/21/2008  .  Essential hypertension 04/21/2008  . GERD 04/21/2008  . Osteoarthritis of left knee 04/21/2008  . URINARY INCONTINENCE 04/21/2008    4:18 PM, 10/17/20 Jerene Pitch, DPT Physical Therapy with [facility]  [phone] office  [place] [address] [place], [place], [address] Phone: [phone]   Fax:  [phone]  Name: Laura Atkins MRN: [id number] Date of Birth: Oct 08, 1940

## 2020-10-17 NOTE — Telephone Encounter (Signed)
Ok I called and scheduled her for 1:20 with gray 10/18/20

## 2020-10-17 NOTE — Telephone Encounter (Signed)
Pt states she left a urine specimen and per [facility] [place] gray there was no UTI but pt wants to know if she needs to come in and leave another urine because something is wrong and she needs meds. She would need an appt.

## 2020-10-18 ENCOUNTER — Ambulatory Visit (INDEPENDENT_AMBULATORY_CARE_PROVIDER_SITE_OTHER): Payer: Medicare HMO | Admitting: Nurse Practitioner

## 2020-10-18 ENCOUNTER — Encounter: Payer: Self-pay | Admitting: Nurse Practitioner

## 2020-10-18 VITALS — BP 130/68 | HR 90 | Temp 97.6°F | Ht 68.0 in | Wt 260.0 lb

## 2020-10-18 DIAGNOSIS — R159 Full incontinence of feces: Secondary | ICD-10-CM | POA: Diagnosis not present

## 2020-10-18 DIAGNOSIS — F0390 Unspecified dementia without behavioral disturbance: Secondary | ICD-10-CM

## 2020-10-18 DIAGNOSIS — R300 Dysuria: Secondary | ICD-10-CM

## 2020-10-18 DIAGNOSIS — Z7409 Other reduced mobility: Secondary | ICD-10-CM | POA: Diagnosis not present

## 2020-10-18 LAB — POCT URINALYSIS DIPSTICK
Bilirubin, UA: NEGATIVE
Blood, UA: NEGATIVE
Glucose, UA: NEGATIVE
Ketones, UA: NEGATIVE
Leukocytes, UA: NEGATIVE
Nitrite, UA: NEGATIVE
Protein, UA: NEGATIVE
Spec Grav, UA: 1.015
Urobilinogen, UA: 0.2 U/dL
pH, UA: 6

## 2020-10-18 MED ORDER — UNABLE TO FIND: 1 refills | Status: DC

## 2020-10-18 NOTE — Assessment & Plan Note (Signed)
-  sent in order for incontinence briefs at last OV -she would like a different brand -discussed that this is not likely possible, but her preference was noted on her order

## 2020-10-18 NOTE — Assessment & Plan Note (Signed)
-  U/A today was negative -will send urine off for culture

## 2020-10-18 NOTE — Assessment & Plan Note (Signed)
-  we sent an order for a walker to [place], but they did not have the style she wanted -we discussed that she will shop around and find a distributor with the style she wants and we will send an order to whoever she chooses after she shops around

## 2020-10-18 NOTE — Progress Notes (Signed)
Acute Office Visit  Subjective:    Patient ID: Laura Atkins, female    DOB: 1941-01-21, 80 y.o.   MRN: [id number]  Chief Complaint  Patient presents with  . Dysuria    Ongoing x1-2 weeks    HPI Patient is in today for dysuria for about 2 weeks.  She has urgency with urination.  She had an incontinence episode at [facility] the other day.  She would like help with with bathing and ADLs.  She states that her incontinence briefs are not high quality. She would like tranquility nighttime.  Past Medical History:  Diagnosis Date  . Alpha thalassemia trait 01/26/2010   02/2012: Nl CBC ex H&H-10.7/34.8, MCV-69   . Anemia   . Anxiety   . Anxiety and depression   . Cellulitis 05/09/2017  . Depression   . Diabetes mellitus   . Foot pain, right 04/30/2013  . GERD (gastroesophageal reflux disease)   . Headache(784.0)   . Hyperlipidemia   . Hypertension   . Iron deficiency 01/21/2017  . Microcytic anemia 01/26/2010   02/2012: Nl CBC ex H&H-10.7/34.8, MCV-69   . NECK PAIN, CHRONIC 10/21/2008   +chronic back pain    . Obesity   . Obstructive sleep apnea   . Osteoarthritis    Left knee; right shoulder; chronic neck and back pain  . Pruritus   . PVD (peripheral vascular disease) ([place]) 01/28/2014  . Seizures ([place])   . Shoulder pain, right 04/14/2015  . Tremor    This started months ago after her seizure progressing to very poor hand writing  . Urinary incontinence   . UTI (urinary tract infection) 01/18/2013    Past Surgical History:  Procedure Laterality Date  . ABDOMINAL HYSTERECTOMY    . BREAST EXCISIONAL BIOPSY     Left; cyst  . CATARACT EXTRACTION Right    12/2017  . CATARACT EXTRACTION W/ INTRAOCULAR LENS IMPLANT Left 09/07/2013  . CHOLECYSTECTOMY    . COLONOSCOPY    . COLONOSCOPY N/A 07/20/2015   Procedure: COLONOSCOPY;  Surgeon: Rogene Houston, MD;  [facility];  Service: Endoscopy;  Laterality: N/A;  930  . EYE SURGERY Left 09/07/2013   cataract    Family  History  Problem Relation Age of Onset  . Lung cancer Mother 29  . Kidney disease Father   . Diabetes Sister   . Keloids Brother   . ADD / ADHD Grandchild   . Bipolar disorder Grandchild   . Bipolar disorder Daughter   . Seizures Daughter   . Heart disease Daughter   . Kidney disease Son   . Neuropathy Son   . Kidney disease Son   . Edema Daughter   . Breast cancer Daughter 4  . Allergies Daughter   . Alcohol abuse Neg Hx   . Drug abuse Neg Hx     Social History   Socioeconomic History  . Marital status: Married    Spouse name: saunders  . Number of children: 5  . Years of education: 74  . Highest education level: Not on file  Occupational History  . Occupation: Disabled     Employer: RETIRED  Tobacco Use  . Smoking status: Never Smoker  . Smokeless tobacco: Never Used  Vaping Use  . Vaping Use: Never used  Substance and Sexual Activity  . Alcohol use: No    Alcohol/week: 0.0 standard drinks  . Drug use: No  . Sexual activity: Yes    Birth control/protection: Surgical  Other  Topics Concern  . Not on file  Social History Narrative   07/27/20 Patient lives at home with her husband [name] and dgtrIvin Booty.  Patient is retired.    Right handed.    Five Children.    Caffeine- 2 daily   Social Determinants of Health   Financial Resource Strain: Low Risk   . Difficulty of Paying Living Expenses: Not very hard  Food Insecurity: No Food Insecurity  . Worried About Charity fundraiser in the Last Year: Never true  . Ran Out of Food in the Last Year: Never true  Transportation Needs: No Transportation Needs  . Lack of Transportation (Medical): No  . Lack of Transportation (Non-Medical): No  Physical Activity: Inactive  . Days of Exercise per Week: 0 days  . Minutes of Exercise per Session: 0 min  Stress: No Stress Concern Present  . Feeling of Stress : Only a little  Social Connections: Moderately Integrated  . Frequency of Communication with Friends and  Family: Three times a week  . Frequency of Social Gatherings with Friends and Family: Three times a week  . Attends Religious Services: More than 4 times per year  . Active Member of Clubs or Organizations: No  . Attends Archivist Meetings: Never  . Marital Status: Married  Human resources officer Violence: Not on file    Outpatient Medications Prior to Visit  Medication Sig Dispense Refill  . Accu-Chek FastClix Lancets MISC 1 each by Does not apply route 4 (four) times daily. Use to monitor glucose levels 4 times daily; E11.65 306 each 2  . Alcohol Swabs (B-D SINGLE USE SWABS REGULAR) PADS 1 each by Does not apply route 4 (four) times daily. Use to prep site for glucose monitoring 4 times daily; E11.65 300 each 2  . amLODipine (NORVASC) 5 MG tablet Take 1 tablet (5 mg total) by mouth daily. 30 tablet 3  . aspirin 81 MG tablet Take 81 mg by mouth daily.    Marland Kitchen atorvastatin (LIPITOR) 40 MG tablet TAKE 1 TABLET BY MOUTH EVERY DAY 30 tablet 2  . benazepril (LOTENSIN) 20 MG tablet     . Blood Glucose Monitoring Suppl (ACCU-CHEK GUIDE ME) w/Device KIT 1 each by Does not apply route 4 (four) times daily. Use to monitor glucose levels 4 times daily; E11.65 1 kit 0  . Cholecalciferol 125 MCG (5000 UT) TABS Take 1 tablet by mouth daily.    . Continuous Blood Gluc Sensor (FREESTYLE LIBRE 14 DAY SENSOR) MISC 1 Device by Other route every 14 (fourteen) days. 6 each 3  . diphenhydrAMINE (BENADRYL) 25 MG tablet Take 25 mg by mouth at bedtime.    . donepezil (ARICEPT) 5 MG tablet Take 1 tablet (5 mg total) by mouth at bedtime. 30 tablet 2  . ferrous sulfate 325 (65 FE) MG tablet Take 1 tablet (325 mg total) by mouth daily with breakfast. 30 tablet 5  . gabapentin (NEURONTIN) 400 MG capsule TAKE ONE CAPSULE BY MOUTH EVERY MORNING and TAKE ONE CAPSULE AT NOON and TAKE TWO CAPSULES AT BEDTIME 360 capsule 1  . glucose blood (ACCU-CHEK GUIDE) test strip 1 each by Other route 4 (four) times daily. 200 strip 12   . insulin NPH-regular Human (NOVOLIN 70/30 RELION) (70-30) 100 UNIT/ML injection 25 units with breakfast, and 10 units with supper. (Patient taking differently: 20 units with breakfast, and 10 units with supper.) 20 mL 11  . Insulin Syringe-Needle U-100 (INSULIN SYRINGE 1CC/31GX5/16") 31G X 5/16" 1 ML  MISC 1 each by Does not apply route daily. Use to inject insulin daily; E11.65 90 each 2  . Multiple Vitamin (MULTIVITAMIN) capsule Take 1 capsule by mouth daily.    . nitrofurantoin (MACRODANTIN) 100 MG capsule Take 100 mg by mouth 4 (four) times daily. Started on 07/26/20    . nitrofurantoin, macrocrystal-monohydrate, (MACROBID) 100 MG capsule Take 1 capsule (100 mg total) by mouth 2 (two) times daily. 14 capsule 0  . omeprazole (PRILOSEC) 20 MG capsule TAKE ONE CAPSULE BY MOUTH EVERY DAY 30 capsule 2  . potassium chloride SA (KLOR-CON) 20 MEQ tablet TAKE 1 TABLET BY MOUTH TWICE DAILY 60 tablet 1  . sertraline (ZOLOFT) 100 MG tablet Take 1 tablet (100 mg total) by mouth daily. 60 tablet 5  . torsemide (DEMADEX) 20 MG tablet Take 2 tablets (40 mg total) by mouth daily. 60 tablet 6  . UNABLE TO FIND Walker x 1  DX unsteady gait, osteoarthritis of left knee 1 each 0  . UNABLE TO FIND Elevated [place] Seat x 1 DX: unsteady gait, back pain, osteoarthritis of left knee 1 each 0  . UNABLE TO FIND Standing upright walker x 1  DX M54.40, M19.90 1 each 0  . UNABLE TO FIND Incontinence pads and supplies 1 each 1   No facility-administered medications prior to visit.    Allergies  Allergen Reactions  . Penicillins Shortness Of Breath, Itching and Rash  . Prednisone Shortness Of Breath, Itching and Rash  . Propoxyphene N-Acetaminophen Itching and Nausea And Vomiting  . Sulfa Antibiotics Itching and Nausea And Vomiting    Review of Systems  Constitutional: Negative.   Genitourinary: Positive for dysuria and urgency. Negative for pelvic pain.  Musculoskeletal:       Uses walker for ambulation        Objective:    Physical Exam Constitutional:      Appearance: She is obese.  Genitourinary:    Comments: U/A today was negative for UTI Musculoskeletal:     Comments: Uses walker for ambulation   Neurological:     Mental Status: She is alert.     BP 130/68 (BP Location: Right Arm, Patient Position: Sitting, Cuff Size: Normal)   Pulse 90   Temp 97.6 F (36.4 C) (Temporal)   Ht 5' 8"  (1.727 m)   Wt 260 lb (117.9 kg)   SpO2 98%   BMI 39.53 kg/m  Wt Readings from Last 3 Encounters:  10/18/20 260 lb (117.9 kg)  09/27/20 258 lb (117 kg)  09/07/20 255 lb (115.7 kg)    Health Maintenance Due  Topic Date Due  . Hepatitis C Screening  Never done  . COVID-19 Vaccine (1) Never done    There are no preventive care reminders to display for this patient.   Lab Results  Component Value Date   TSH 1.129 05/27/2020   Lab Results  Component Value Date   WBC 6.3 06/03/2020   HGB 9.6 (L) 06/03/2020   HCT 33.7 (L) 06/03/2020   MCV 69.3 (L) 06/03/2020   PLT 170 06/03/2020   Lab Results  Component Value Date   NA 138 06/03/2020   K 3.7 06/03/2020   CO2 26 06/03/2020   GLUCOSE 145 (H) 06/03/2020   BUN 19 06/03/2020   CREATININE 1.13 (H) 06/03/2020   BILITOT 0.5 06/03/2020   ALKPHOS 76 06/03/2020   AST 35 06/03/2020   ALT 38 06/03/2020   PROT 7.1 06/03/2020   ALBUMIN 3.6 06/03/2020   CALCIUM 9.5 06/03/2020  ANIONGAP 11 06/03/2020   Lab Results  Component Value Date   CHOL 149 12/01/2019   Lab Results  Component Value Date   HDL 58 12/01/2019   Lab Results  Component Value Date   LDLCALC 72 12/01/2019   Lab Results  Component Value Date   TRIG 100 12/01/2019   Lab Results  Component Value Date   CHOLHDL 2.6 12/01/2019   Lab Results  Component Value Date   HGBA1C 8.3 (A) 06/30/2020       Assessment & Plan:   Problem List Items Addressed This Visit      Nervous and Auditory   Dementia ([place])   Relevant Medications   UNABLE TO FIND     Other    Fecal incontinence    -sent in order for incontinence briefs at last OV -she would like a different brand -discussed that this is not likely possible, but her preference was noted on her order      Limited mobility    -we sent an order for a walker to [employer], but they did not have the style she wanted -we discussed that she will shop around and find a distributor with the style she wants and we will send an order to whoever she chooses after she shops around      Dysuria - Primary    -U/A today was negative -will send urine off for culture      Relevant Orders   POCT Urinalysis Dipstick   Urine Culture       Meds ordered this encounter  Medications  . UNABLE TO FIND    Sig: Incontinence pads and supplies    Dispense:  1 each    Refill:  1    She prefers Tranquility Nighttime briefs     Noreene Larsson, NP

## 2020-10-19 ENCOUNTER — Other Ambulatory Visit: Payer: Self-pay

## 2020-10-19 ENCOUNTER — Ambulatory Visit (HOSPITAL_COMMUNITY): Payer: Medicare HMO | Admitting: Physical Therapy

## 2020-10-19 DIAGNOSIS — R2689 Other abnormalities of gait and mobility: Secondary | ICD-10-CM | POA: Diagnosis not present

## 2020-10-19 DIAGNOSIS — M6281 Muscle weakness (generalized): Secondary | ICD-10-CM | POA: Diagnosis not present

## 2020-10-19 DIAGNOSIS — R262 Difficulty in walking, not elsewhere classified: Secondary | ICD-10-CM

## 2020-10-19 NOTE — Therapy (Addendum)
[place], [place], [address] Phone: [phone]   Fax:  [phone]  Physical Therapy Treatment  Patient Details  Name: Laura Atkins MRN: [id number] Date of Birth: November 22, 1940 Referring Provider (PT): Pilar Jarvis   Encounter Date: 10/19/2020   PT End of Session - 10/19/20 1538    Visit Number 7    Number of Visits 12    Date for PT Re-Evaluation 10/29/20    Authorization Type [employer] approved 12 visits from 09/29/20 to 10/27/20    Authorization - Visit Number 7    Authorization - Number of Visits 12    Progress Note Due on Visit 10    PT Start Time 1500   pt 15 minutes late   PT Stop Time 1530    PT Time Calculation (min) 30 min    Equipment Utilized During Treatment Gait belt    Activity Tolerance Patient tolerated treatment well;Patient limited by pain;No increased pain;Patient limited by fatigue    Behavior During Therapy [facility] for tasks assessed/performed           Past Medical History:  Diagnosis Date  . Alpha thalassemia trait 01/26/2010   02/2012: Nl CBC ex H&H-10.7/34.8, MCV-69   . Anemia   . Anxiety   . Anxiety and depression   . Cellulitis 05/09/2017  . Depression   . Diabetes mellitus   . Foot pain, right 04/30/2013  . GERD (gastroesophageal reflux disease)   . Headache(784.0)   . Hyperlipidemia   . Hypertension   . Iron deficiency 01/21/2017  . Microcytic anemia 01/26/2010   02/2012: Nl CBC ex H&H-10.7/34.8, MCV-69   . NECK PAIN, CHRONIC 10/21/2008   +chronic back pain    . Obesity   . Obstructive sleep apnea   . Osteoarthritis    Left knee; right shoulder; chronic neck and back pain  . Pruritus   . PVD (peripheral vascular disease) ([place]) 01/28/2014  . Seizures ([place])   . Shoulder pain, right 04/14/2015  . Tremor    This started months ago after her seizure progressing to very poor hand writing  . Urinary incontinence   . UTI (urinary tract infection) 01/18/2013    Past Surgical History:   Procedure Laterality Date  . ABDOMINAL HYSTERECTOMY    . BREAST EXCISIONAL BIOPSY     Left; cyst  . CATARACT EXTRACTION Right    12/2017  . CATARACT EXTRACTION W/ INTRAOCULAR LENS IMPLANT Left 09/07/2013  . CHOLECYSTECTOMY    . COLONOSCOPY    . COLONOSCOPY N/A 07/20/2015   Procedure: COLONOSCOPY;  Surgeon: Rogene Houston, MD;  Location[facility];  Service: Endoscopy;  Laterality: N/A;  930  . EYE SURGERY Left 09/07/2013   cataract    There were no vitals filed for this visit.   Subjective Assessment - 10/19/20 1502    Subjective PT has been doing her HEP; her Lt leg is bothering her    Patient is accompained by: Family member    Pertinent History HTN, DM, AKI seizure like activity.    How long can you sit comfortably? no problem    How long can you stand comfortably? tested with no assisted device pt needs contact guard for 1:55    How long can you walk comfortably? less than two minutes    Currently in Pain? Yes    Pain Score 8     Pain Location Leg    Pain Orientation Left    Pain Descriptors /  Indicators Aching    Pain Type Chronic pain    Pain Onset More than a month ago    Pain Frequency Constant    Aggravating Factors  wt bearing    Pain Relieving Factors med s    Effect of Pain on Daily Activities limits                             [facility] Adult PT Treatment/Exercise - 10/19/20 0001      Exercises   Exercises Knee/Hip      Knee/Hip Exercises: Stretches   Gastroc Stretch Limitations slant board x 1 '      Knee/Hip Exercises: Standing   Other Standing Knee Exercises Side steip in // x 2 RT    Other Standing Knee Exercises ball circling forward, over Rt knee and LT knee to improve balance and wt shifting combined with breathing      Knee/Hip Exercises: Seated   Other Seated Knee/Hip Exercises ball circling large circles prior to sit to stand to improve wt shifting to get shoulders over knees to prepare for sit tostand.    Sit to Sand 10  reps      Manual Therapy   Manual Therapy Taping    Manual therapy comments seperate from all aspects to decrease pain.                    PT Short Term Goals - 10/07/20 1633      PT SHORT TERM GOAL #1   Title Patient will independently verbalize and demo initial HEP in order to improve B LE strength with ambulation and transfers.     Time 2    Status On-going    Target Date 10/13/20      PT SHORT TERM GOAL #2   Title PT will be able to complete 5 sit to stand in a 30 second period using UE assist to demonstrate improved power.    Time 2    Period Weeks    Status On-going      PT SHORT TERM GOAL #3   Title PT  will be able to ambulate 118f with a RW in 2 minute time period to demonstrate reduce risk of falling.    Time 2    Period Weeks    Status On-going             PT Long Term Goals - 10/07/20 1633      PT LONG TERM GOAL #1   Title PT to be able to come sit to stand without the use of UE to demonstrate improved balance and strength.    Time 4    Period Weeks    Status On-going      PT LONG TERM GOAL #2   Title PT to be able to single leg stance on both LE for 5 seconds to decrease risk of falling    Time 4    Period Weeks    Status On-going      PT LONG TERM GOAL #3   Title PT to be able to walk 2089fwith a rolling walker in a 2 minute timeframe to allow pt to be able to make it to the restroom in time without having an accident    Time 4    Status On-going      PT LONG TERM GOAL #4   Status On-going      PT LONG TERM GOAL #  5   Status On-going                 Plan - 10/19/20 1541    Assessment:                                                                     Therapist emphasised the importance of making it                                                                                             To her appointments on time.  Treatment focused                                                                                                On pt ability to wt shift as well as increasing pt                                                                                                Stride length.     Personal Factors and Comorbidities Fitness;Past/Current Experience;Comorbidity 3+    Comorbidities HTN, seizure like activity, DM, memory issues    Examination-Activity Limitations Caring for Others;Carry;Dressing;Lift;Locomotion Level;Stairs;Stand    Examination-Participation Restrictions Cleaning;Laundry;Meal Prep;Shop    Stability/Clinical Decision Making Evolving/Moderate complexity    Rehab Potential Good    PT Frequency 3x / week    PT Duration 4 weeks    PT Treatment/Interventions Gait training;Stair training;Functional mobility training;Therapeutic activities;Therapeutic exercise;Balance training;Neuromuscular re-education;Patient/family education    PT Next Visit Plan gait focusing on increasing velocity and step thru technique. Balance and weight shifting in standing.    PT Home Exercise Plan 1/10: AP and lateral weight shifts; 1/14: LAQ, sit to stand at kitchen table, bridge, dead bug and sidelying abduction; STS, hamstring stretch with quad set           Patient will benefit from skilled therapeutic intervention in order to improve the following deficits and impairments:  Abnormal gait,Decreased activity tolerance,Decreased balance,Decreased endurance,Decreased mobility,Decreased range of motion,Difficulty walking,Decreased strength,Pain  Visit Diagnosis: Difficulty in walking, not elsewhere classified  Muscle weakness (generalized)  Other abnormalities of  gait and mobility     Problem List Patient Active Problem List   Diagnosis Date Noted  . Dysuria 10/18/2020  . Dementia ([place]) 07/13/2020  . Cognitive deficits   . Infection   . Carotid bruit 05/26/2020  . Hyperlipidemia associated with type 2 diabetes mellitus ([place]) 12/08/2019  . Iron deficiency 01/21/2017  . Vitamin D deficiency 10/15/2016   . Urinary incontinence 10/15/2016  . Limited mobility 05/27/2016  . Type 2 diabetes mellitus with hyperglycemia ([place]) 02/04/2016  . Sleep terror disorder 12/14/2015  . Fecal incontinence 12/13/2015  . Back pain of lumbar region with sciatica 04/15/2015  . Spondylosis, cervical, with myelopathy 04/14/2015  . OSA (obstructive sleep apnea) 04/10/2013  . Peripheral neuropathy 03/12/2013  . Hearing loss 11/06/2012  . Seizure-like activity ([place]) 01/25/2012  . UNSTEADY GAIT 11/07/2010  . Alpha thalassemia ([place]) 01/26/2010  . Morbid obesity ([place]) 04/21/2008  . Anxiety and depression 04/21/2008  . Essential hypertension 04/21/2008  . GERD 04/21/2008  . Osteoarthritis of left knee 04/21/2008  . URINARY INCONTINENCE 04/21/2008    Rayetta Humphrey, PT CLT [phone] 10/19/2020, 3:42 PM  [place] [address] [place], [place], [address] Phone: [phone]   Fax:  [phone]  Name: ALICIYA SURRATT MRN: [id number] Date of Birth: [birth year]

## 2020-10-20 ENCOUNTER — Ambulatory Visit (HOSPITAL_COMMUNITY): Payer: Medicare HMO | Admitting: Physical Therapy

## 2020-10-20 ENCOUNTER — Encounter (HOSPITAL_COMMUNITY): Payer: Self-pay | Admitting: Physical Therapy

## 2020-10-20 DIAGNOSIS — R2689 Other abnormalities of gait and mobility: Secondary | ICD-10-CM | POA: Diagnosis not present

## 2020-10-20 DIAGNOSIS — M6281 Muscle weakness (generalized): Secondary | ICD-10-CM | POA: Diagnosis not present

## 2020-10-20 DIAGNOSIS — R262 Difficulty in walking, not elsewhere classified: Secondary | ICD-10-CM

## 2020-10-20 LAB — URINE CULTURE

## 2020-10-20 NOTE — Therapy (Addendum)
[place], [place], [address] Phone: [phone]   Fax:  [phone]  Physical Therapy Treatment  Patient Details  Name: Laura Atkins MRN: [id number] Date of Birth: 1941-04-11 Referring Provider (PT): Pilar Jarvis   Encounter Date: 10/20/2020   PT End of Session - 10/20/20 1628    Visit Number 8    Number of Visits 12    Date for PT Re-Evaluation 10/29/20    Authorization Type [employer] approved 12 visits from 09/29/20 to 10/27/20    Authorization - Visit Number 8    Authorization - Number of Visits 12    Progress Note Due on Visit 10    PT Start Time 1540    PT Stop Time 1622    PT Time Calculation (min) 42 min    Equipment Utilized During Treatment Gait belt    Activity Tolerance Patient tolerated treatment well;Patient limited by pain;No increased pain;Patient limited by fatigue    Behavior During Therapy [facility] for tasks assessed/performed           Past Medical History:  Diagnosis Date  . Alpha thalassemia trait 01/26/2010   02/2012: Nl CBC ex H&H-10.7/34.8, MCV-69   . Anemia   . Anxiety   . Anxiety and depression   . Cellulitis 05/09/2017  . Depression   . Diabetes mellitus   . Foot pain, right 04/30/2013  . GERD (gastroesophageal reflux disease)   . Headache(784.0)   . Hyperlipidemia   . Hypertension   . Iron deficiency 01/21/2017  . Microcytic anemia 01/26/2010   02/2012: Nl CBC ex H&H-10.7/34.8, MCV-69   . NECK PAIN, CHRONIC 10/21/2008   +chronic back pain    . Obesity   . Obstructive sleep apnea   . Osteoarthritis    Left knee; right shoulder; chronic neck and back pain  . Pruritus   . PVD (peripheral vascular disease) ([place]) 01/28/2014  . Seizures ([place])   . Shoulder pain, right 04/14/2015  . Tremor    This started months ago after her seizure progressing to very poor hand writing  . Urinary incontinence   . UTI (urinary tract infection) 01/18/2013    Past Surgical History:  Procedure Laterality  Date  . ABDOMINAL HYSTERECTOMY    . BREAST EXCISIONAL BIOPSY     Left; cyst  . CATARACT EXTRACTION Right    12/2017  . CATARACT EXTRACTION W/ INTRAOCULAR LENS IMPLANT Left 09/07/2013  . CHOLECYSTECTOMY    . COLONOSCOPY    . COLONOSCOPY N/A 07/20/2015   Procedure: COLONOSCOPY;  Surgeon: Rogene Houston, MD;  Location[facility];  Service: Endoscopy;  Laterality: N/A;  930  . EYE SURGERY Left 09/07/2013   cataract    There were no vitals filed for this visit.   Subjective Assessment - 10/20/20 1548    Subjective PT Lt knee continues to bother her    Patient is accompained by: Family member    Pertinent History HTN, DM, AKI seizure like activity.    How long can you sit comfortably? no problem    How long can you stand comfortably? tested with no assisted device pt needs contact guard for 1:55    How long can you walk comfortably? less than two minutes    Currently in Pain? Yes    Pain Score 6     Pain Location Knee    Pain Orientation Left    Pain Descriptors / Indicators Aching    Pain Type Chronic pain  Pain Onset More than a month ago    Pain Frequency Intermittent    Aggravating Factors  wt bearing    Pain Relieving Factors meds    Effect of Pain on Daily Activities limits                             [facility] Adult PT Treatment/Exercise - 10/20/20 0001      Ambulation/Gait   Ambulation Distance (Feet) 70 Feet    Assistive device Rolling walker      Exercises   Exercises Knee/Hip      Knee/Hip Exercises: Standing   Other Standing Knee Exercises side stepping for wt shift; step forward and shift wt to front foot and back foot.      Knee/Hip Exercises: Seated   Long Arc Quad Both;10 reps    Other Seated Knee/Hip Exercises cervical and thoracic excursions    Other Seated Knee/Hip Exercises ankle DF/PF x 10    Sit to Sand 10 reps                    PT Short Term Goals - 10/07/20 1633      PT SHORT TERM GOAL #1   Title Patient  will independently verbalize and demo initial HEP in order to improve B LE strength with ambulation and transfers.     Time 2    Status On-going    Target Date 10/13/20      PT SHORT TERM GOAL #2   Title PT will be able to complete 5 sit to stand in a 30 second period using UE assist to demonstrate improved power.    Time 2    Period Weeks    Status On-going      PT SHORT TERM GOAL #3   Title PT  will be able to ambulate 137f with a RW in 2 minute time period to demonstrate reduce risk of falling.    Time 2    Period Weeks    Status On-going             PT Long Term Goals - 10/07/20 1633      PT LONG TERM GOAL #1   Title PT to be able to come sit to stand without the use of UE to demonstrate improved balance and strength.    Time 4    Period Weeks    Status On-going      PT LONG TERM GOAL #2   Title PT to be able to single leg stance on both LE for 5 seconds to decrease risk of falling    Time 4    Period Weeks    Status On-going      PT LONG TERM GOAL #3   Title PT to be able to walk 2046fwith a rolling walker in a 2 minute timeframe to allow pt to be able to make it to the restroom in time without having an accident    Time 4    Status On-going      PT LONG TERM GOAL #4   Status On-going      PT LONG TERM GOAL #5   Status On-going                 Plan - 10/20/20 1642    Clinical Impression Statement Added excursions to attempt to improve spinal mobility to assist in improving balance.  Knee taping was not successful in reduction  of pain.  Treatment continues to focus on improved wt shifting to improve safety of gt.    Personal Factors and Comorbidities Fitness;Past/Current Experience;Comorbidity 3+    Comorbidities HTN, seizure like activity, DM, memory issues    Examination-Activity Limitations Caring for Others;Carry;Dressing;Lift;Locomotion Level;Stairs;Stand    Examination-Participation Restrictions Cleaning;Laundry;Meal Prep;Shop     Stability/Clinical Decision Making Evolving/Moderate complexity    Rehab Potential Good    PT Frequency 3x / week    PT Duration 4 weeks    PT Treatment/Interventions Gait training;Stair training;Functional mobility training;Therapeutic activities;Therapeutic exercise;Balance training;Neuromuscular re-education;Patient/family education    PT Next Visit Plan gait focusing on increasing velocity and step thru technique. Balance and weight shifting in standing.    PT Home Exercise Plan 1/10: AP and lateral weight shifts; 1/14: LAQ, sit to stand at kitchen table, bridge, dead bug and sidelying abduction; STS, hamstring stretch with quad set; 1/27: cervical and thoracic excursions.           Patient will benefit from skilled therapeutic intervention in order to improve the following deficits and impairments:  Abnormal gait,Decreased activity tolerance,Decreased balance,Decreased endurance,Decreased mobility,Decreased range of motion,Difficulty walking,Decreased strength,Pain  Visit Diagnosis: Difficulty in walking, not elsewhere classified  Muscle weakness (generalized)  Other abnormalities of gait and mobility     Problem List Patient Active Problem List   Diagnosis Date Noted  . Dysuria 10/18/2020  . Dementia ([place]) 07/13/2020  . Cognitive deficits   . Infection   . Carotid bruit 05/26/2020  . Hyperlipidemia associated with type 2 diabetes mellitus ([place]) 12/08/2019  . Iron deficiency 01/21/2017  . Vitamin D deficiency 10/15/2016  . Urinary incontinence 10/15/2016  . Limited mobility 05/27/2016  . Type 2 diabetes mellitus with hyperglycemia ([place]) 02/04/2016  . Sleep terror disorder 12/14/2015  . Fecal incontinence 12/13/2015  . Back pain of lumbar region with sciatica 04/15/2015  . Spondylosis, cervical, with myelopathy 04/14/2015  . OSA (obstructive sleep apnea) 04/10/2013  . Peripheral neuropathy 03/12/2013  . Hearing loss 11/06/2012  . Seizure-like activity ([place]) 01/25/2012   . UNSTEADY GAIT 11/07/2010  . Alpha thalassemia ([place]) 01/26/2010  . Morbid obesity ([place]) 04/21/2008  . Anxiety and depression 04/21/2008  . Essential hypertension 04/21/2008  . GERD 04/21/2008  . Osteoarthritis of left knee 04/21/2008  . URINARY INCONTINENCE 04/21/2008    Rayetta Humphrey, PT CLT [phone] 10/20/2020, 4:44 PM  [place] [address] [place], [place], [address] Phone: [phone]   Fax:  [phone]  Name: QUENTASIA STMARIE MRN: [id number] Date of Birth: March 19, 1941

## 2020-10-20 NOTE — Progress Notes (Signed)
No uti on labs.

## 2020-10-25 ENCOUNTER — Other Ambulatory Visit: Payer: Self-pay

## 2020-10-25 ENCOUNTER — Ambulatory Visit (HOSPITAL_COMMUNITY): Payer: Medicare HMO | Attending: Nurse Practitioner | Admitting: Physical Therapy

## 2020-10-25 DIAGNOSIS — M6281 Muscle weakness (generalized): Secondary | ICD-10-CM | POA: Diagnosis not present

## 2020-10-25 DIAGNOSIS — R2689 Other abnormalities of gait and mobility: Secondary | ICD-10-CM | POA: Diagnosis not present

## 2020-10-25 DIAGNOSIS — R262 Difficulty in walking, not elsewhere classified: Secondary | ICD-10-CM | POA: Diagnosis not present

## 2020-10-25 NOTE — Therapy (Signed)
[place], [place], [address] Phone: [phone]   Fax:  [phone]  Physical Therapy Treatment  Patient Details  Name: Laura Atkins MRN: [id number] Date of Birth: 07-Oct-1940 Referring Provider (PT): Pilar Jarvis   Encounter Date: 10/25/2020   PT End of Session - 10/25/20 1313    Visit Number 9    Number of Visits 12    Date for PT Re-Evaluation 10/29/20    Authorization Type [employer] approved 12 visits from 09/29/20 to 10/27/20    Authorization - Visit Number 9    Authorization - Number of Visits 12    Progress Note Due on Visit 10    PT Start Time [id number]    PT Stop Time 1132    PT Time Calculation (min) 47 min    Equipment Utilized During Treatment Gait belt    Activity Tolerance Patient tolerated treatment well;Patient limited by pain;No increased pain;Patient limited by fatigue    Behavior During Therapy [facility] for tasks assessed/performed           Past Medical History:  Diagnosis Date  . Alpha thalassemia trait 01/26/2010   02/2012: Nl CBC ex H&H-10.7/34.8, MCV-69   . Anemia   . Anxiety   . Anxiety and depression   . Cellulitis 05/09/2017  . Depression   . Diabetes mellitus   . Foot pain, right 04/30/2013  . GERD (gastroesophageal reflux disease)   . Headache(784.0)   . Hyperlipidemia   . Hypertension   . Iron deficiency 01/21/2017  . Microcytic anemia 01/26/2010   02/2012: Nl CBC ex H&H-10.7/34.8, MCV-69   . NECK PAIN, CHRONIC 10/21/2008   +chronic back pain    . Obesity   . Obstructive sleep apnea   . Osteoarthritis    Left knee; right shoulder; chronic neck and back pain  . Pruritus   . PVD (peripheral vascular disease) ([place]) 01/28/2014  . Seizures ([place])   . Shoulder pain, right 04/14/2015  . Tremor    This started months ago after her seizure progressing to very poor hand writing  . Urinary incontinence   . UTI (urinary tract infection) 01/18/2013    Past Surgical History:  Procedure Laterality  Date  . ABDOMINAL HYSTERECTOMY    . BREAST EXCISIONAL BIOPSY     Left; cyst  . CATARACT EXTRACTION Right    12/2017  . CATARACT EXTRACTION W/ INTRAOCULAR LENS IMPLANT Left 09/07/2013  . CHOLECYSTECTOMY    . COLONOSCOPY    . COLONOSCOPY N/A 07/20/2015   Procedure: COLONOSCOPY;  Surgeon: Rogene Houston, MD;  Location[facility];  Service: Endoscopy;  Laterality: N/A;  930  . EYE SURGERY Left 09/07/2013   cataract    There were no vitals filed for this visit.   Subjective Assessment - 10/25/20 1308    Subjective pt accompanied by husband today.  STates she's been doing some exericses and her pain is elevated in her Rt knee today to 8/10.                             [facility] Adult PT Treatment/Exercise - 10/25/20 0001      Ambulation/Gait   Ambulation Distance (Feet) 200 Feet   100 feet X 2 (back to treatment room and out at end)   Assistive device Rolling walker      Knee/Hip Exercises: Standing   Other Standing Knee Exercises side stepping for wt shift; step forward  and shift wt to front foot and back foot.    Other Standing Knee Exercises standing agaist wall with UE flexion nice and tall and hlds 10X      Knee/Hip Exercises: Seated   Long Arc Quad Both;15 reps    Sit to [employer] 2 sets;5 reps;with UE support   as little UE support as able                   PT Short Term Goals - 10/07/20 1633      PT SHORT TERM GOAL #1   Title Patient will independently verbalize and demo initial HEP in order to improve B LE strength with ambulation and transfers.     Time 2    Status On-going    Target Date 10/13/20      PT SHORT TERM GOAL #2   Title PT will be able to complete 5 sit to stand in a 30 second period using UE assist to demonstrate improved power.    Time 2    Period Weeks    Status On-going      PT SHORT TERM GOAL #3   Title PT  will be able to ambulate 146f with a RW in 2 minute time period to demonstrate reduce risk of falling.    Time 2     Period Weeks    Status On-going             PT Long Term Goals - 10/07/20 1633      PT LONG TERM GOAL #1   Title PT to be able to come sit to stand without the use of UE to demonstrate improved balance and strength.    Time 4    Period Weeks    Status On-going      PT LONG TERM GOAL #2   Title PT to be able to single leg stance on both LE for 5 seconds to decrease risk of falling    Time 4    Period Weeks    Status On-going      PT LONG TERM GOAL #3   Title PT to be able to walk 2096fwith a rolling walker in a 2 minute timeframe to allow pt to be able to make it to the restroom in time without having an accident    Time 4    Status On-going      PT LONG TERM GOAL #4   Status On-going      PT LONG TERM GOAL #5   Status On-going                 Plan - 10/25/20 1332    Clinical Impression Statement Began and ended session with 100 feet ambulation working on gait quality including posturing, equal stride and forward gaze.  Therex focused on keeping upright, stabilizing upon standing without use of walker and minimal use of UE' and LE strengthening. Encouraged pt/spouse to do more activity home; husband to assist less with standing/mobility.    Personal Factors and Comorbidities Fitness;Past/Current Experience;Comorbidity 3+    Comorbidities HTN, seizure like activity, DM, memory issues    Examination-Activity Limitations Caring for Others;Carry;Dressing;Lift;Locomotion Level;Stairs;Stand    Examination-Participation Restrictions Cleaning;Laundry;Meal Prep;Shop    Stability/Clinical Decision Making Evolving/Moderate complexity    Rehab Potential Good    PT Frequency 3x / week    PT Duration 4 weeks    PT Treatment/Interventions Gait training;Stair training;Functional mobility training;Therapeutic activities;Therapeutic exercise;Balance training;Neuromuscular re-education;Patient/family education    PT  Next Visit Plan Complete 10th visit progress note next session.     PT Home Exercise Plan 1/10: AP and lateral weight shifts; 1/14: LAQ, sit to stand at kitchen table, bridge, dead bug and sidelying abduction; STS, hamstring stretch with quad set; 1/27: cervical and thoracic excursions.           Patient will benefit from skilled therapeutic intervention in order to improve the following deficits and impairments:  Abnormal gait,Decreased activity tolerance,Decreased balance,Decreased endurance,Decreased mobility,Decreased range of motion,Difficulty walking,Decreased strength,Pain  Visit Diagnosis: Difficulty in walking, not elsewhere classified  Muscle weakness (generalized)  Other abnormalities of gait and mobility     Problem List Patient Active Problem List   Diagnosis Date Noted  . Dysuria 10/18/2020  . Dementia ([place]) 07/13/2020  . Cognitive deficits   . Infection   . Carotid bruit 05/26/2020  . Hyperlipidemia associated with type 2 diabetes mellitus ([place]) 12/08/2019  . Iron deficiency 01/21/2017  . Vitamin D deficiency 10/15/2016  . Urinary incontinence 10/15/2016  . Limited mobility 05/27/2016  . Type 2 diabetes mellitus with hyperglycemia ([place]) 02/04/2016  . Sleep terror disorder 12/14/2015  . Fecal incontinence 12/13/2015  . Back pain of lumbar region with sciatica 04/15/2015  . Spondylosis, cervical, with myelopathy 04/14/2015  . OSA (obstructive sleep apnea) 04/10/2013  . Peripheral neuropathy 03/12/2013  . Hearing loss 11/06/2012  . Seizure-like activity ([place]) 01/25/2012  . UNSTEADY GAIT 11/07/2010  . Alpha thalassemia ([place]) 01/26/2010  . Morbid obesity ([place]) 04/21/2008  . Anxiety and depression 04/21/2008  . Essential hypertension 04/21/2008  . GERD 04/21/2008  . Osteoarthritis of left knee 04/21/2008  . URINARY INCONTINENCE 04/21/2008   Teena Irani, PTA/CLT [phone]  Teena Irani 10/25/2020, 1:33 PM  [facility] [address] [place], [place], [address] Phone:  [phone]   Fax:  [phone]  Name: KRISTIANNA GAMBRELL MRN: [id number] Date of Birth: 1941/03/24

## 2020-10-26 ENCOUNTER — Other Ambulatory Visit: Payer: Self-pay | Admitting: Family Medicine

## 2020-10-27 ENCOUNTER — Encounter (HOSPITAL_COMMUNITY): Payer: Self-pay | Admitting: Physical Therapy

## 2020-10-27 ENCOUNTER — Other Ambulatory Visit: Payer: Self-pay

## 2020-10-27 ENCOUNTER — Ambulatory Visit (HOSPITAL_COMMUNITY): Payer: Medicare HMO | Admitting: Physical Therapy

## 2020-10-27 DIAGNOSIS — R2689 Other abnormalities of gait and mobility: Secondary | ICD-10-CM | POA: Diagnosis not present

## 2020-10-27 DIAGNOSIS — M6281 Muscle weakness (generalized): Secondary | ICD-10-CM | POA: Diagnosis not present

## 2020-10-27 DIAGNOSIS — R262 Difficulty in walking, not elsewhere classified: Secondary | ICD-10-CM

## 2020-10-27 NOTE — Therapy (Addendum)
[facility] [address] [place], [place], [address] Phone: [phone]   Fax:  [phone]  Physical Therapy Treatment  Patient Details  Name: Laura Atkins MRN: [id number] Date of Birth: [birth year] Referring Provider (PT): Pilar Jarvis   Encounter Date: 10/27/2020   Progress Note Reporting Period 09/29/2020  to 10/27/2020  See note below for Objective Data and Assessment of Progress/Goals.         PT End of Session - 10/27/20 1417    Visit Number 10    Number of Visits 22    Date for PT Re-Evaluation 12/13/20    Authorization Type [employer] approved 12 visits from 09/29/20 to 10/27/20; requested 12 visits on 2/3 starting on 2/8    Authorization - Visit Number 10    Authorization - Number of Visits 12    Progress Note Due on Visit 10    PT Start Time 1332    PT Stop Time 1410    PT Time Calculation (min) 38 min    Equipment Utilized During Treatment Gait belt    Activity Tolerance Patient tolerated treatment well;Patient limited by pain;No increased pain;Patient limited by fatigue    Behavior During Therapy [facility] for tasks assessed/performed           Past Medical History:  Diagnosis Date  . Alpha thalassemia trait 01/26/2010   02/2012: Nl CBC ex H&H-10.7/34.8, MCV-69   . Anemia   . Anxiety   . Anxiety and depression   . Cellulitis 05/09/2017  . Depression   . Diabetes mellitus   . Foot pain, right 04/30/2013  . GERD (gastroesophageal reflux disease)   . Headache(784.0)   . Hyperlipidemia   . Hypertension   . Iron deficiency 01/21/2017  . Microcytic anemia 01/26/2010   02/2012: Nl CBC ex H&H-10.7/34.8, MCV-69   . NECK PAIN, CHRONIC 10/21/2008   +chronic back pain    . Obesity   . Obstructive sleep apnea   . Osteoarthritis    Left knee; right shoulder; chronic neck and back pain  . Pruritus   . PVD (peripheral vascular disease) ([place]) 01/28/2014  . Seizures ([place])   . Shoulder pain, right 04/14/2015  . Tremor    This started months  ago after her seizure progressing to very poor hand writing  . Urinary incontinence   . UTI (urinary tract infection) 01/18/2013    Past Surgical History:  Procedure Laterality Date  . ABDOMINAL HYSTERECTOMY    . BREAST EXCISIONAL BIOPSY     Left; cyst  . CATARACT EXTRACTION Right    12/2017  . CATARACT EXTRACTION W/ INTRAOCULAR LENS IMPLANT Left 09/07/2013  . CHOLECYSTECTOMY    . COLONOSCOPY    . COLONOSCOPY N/A 07/20/2015   Procedure: COLONOSCOPY;  Surgeon: Rogene Houston, MD;  [facility];  Service: Endoscopy;  Laterality: N/A;  930  . EYE SURGERY Left 09/07/2013   cataract    There were no vitals filed for this visit.       [facility] PT Assessment - 10/27/20 0001      Assessment   Medical Diagnosis Difficulty walking    Referring Provider (PT) Pilar Jarvis    Onset Date/Surgical Date 05/27/20    Prior Therapy acute and SNF      Precautions   Precautions Fall      Restrictions   Weight Bearing Restrictions No      [place] residence    [place] entrance  Home Layout One level      Prior Function   Level of Independence Independent with household mobility with device      Cognition   Overall Cognitive Status Within Functional Limits for tasks assessed      Functional Tests   Functional tests Single leg stance;Sit to Stand      Single Leg Stance   Comments unable      Sit to Stand   Comments unable without UE assist; with UE assist in 30 seconds pt is able to complete 3 reps was 2      AROM   Right Knee Extension 7   was 10   Right Knee Flexion 105   was 100   Left Knee Extension 15    Left Knee Flexion 90   90     Strength   Right Hip Flexion 5/5   was 4/5   Right Hip Extension 3/5   was 2/5   Right Hip ABduction 4-/5    Left Hip Flexion 4/5    Left Hip Extension 3-/5   was 2/5   Left Hip ABduction 3+/5    Right Knee Flexion 4+/5    Right Knee Extension 5/5   was 4+   Left Knee Flexion  4+/5   tested prone initally tested sitting   Left Knee Extension 5/5   was 4+   Right Ankle Dorsiflexion 3+/5   was 3/5   Left Ankle Dorsiflexion 4/5   was 3+     Ambulation/Gait   Ambulation Distance (Feet) 74 Feet   was 58   Assistive device Rolling walker    Gait Pattern Step-to pattern    Gait Comments 2 minute walk testl                         [facility] Adult PT Treatment/Exercise - 10/27/20 0001      Exercises   Exercises Knee/Hip      Knee/Hip Exercises: Stretches   Sports administrator Both;3 reps;10 seconds      Knee/Hip Exercises: Seated   Sit to [employer] 5 reps      Knee/Hip Exercises: Sidelying   Hip ABduction Strengthening;Both;10 reps      Knee/Hip Exercises: Prone   Hamstring Curl 10 reps    Hip Extension Both;10 reps                    PT Short Term Goals - 10/27/20 1436      PT SHORT TERM GOAL #1   Title Patient will independently verbalize and demo initial HEP in order to improve B LE strength with ambulation and transfers.     Time 2    Status Achieved    Target Date 10/13/20      PT SHORT TERM GOAL #2   Title PT will be able to complete 5 sit to stand in a 30 second period using UE assist to demonstrate improved power.    Baseline 2/3: 3 in 30 seconds    Time 2    Period Weeks    Status Not Met      PT SHORT TERM GOAL #3   Title PT  will be able to ambulate 129f with a RW in 2 minute time period to demonstrate reduce risk of falling.    Time 2    Period Weeks    Status Achieved      PT SHORT TERM GOAL #4   Status On-going  PT Long Term Goals - 10/27/20 1407      PT LONG TERM GOAL #1   Title PT to be able to come sit to stand without the use of UE to demonstrate improved balance and strength.    Time 4    Period Weeks    Status On-going      PT LONG TERM GOAL #2   Title PT to be able to single leg stance on both LE for 5 seconds to decrease risk of falling    Time 4    Period Weeks    Status On-going       PT LONG TERM GOAL #3   Title PT to be able to walk 266f with a rolling walker in a 2 minute timeframe to allow pt to be able to make it to the restroom in time without having an accident    Time 4    Status On-going      PT LONG TERM GOAL #4   Status On-going      PT LONG TERM GOAL #5   Status On-going                 Plan - 10/27/20 1425    Clinical Impression Statement Pt reassessed today with noted improvement in strength and ROM.  PT still has significant gluteal weakness and decreased balance which is making sit to stand difficulty as well as decreasing her velocity with ambulation.  Pt is completing her HEP.  Pt is not at the functional level that she would like to be therefore therapist recommends continued skilled PT to work on ROM, strength and balance to maximize her functional ability.    Personal Factors and Comorbidities Fitness;Past/Current Experience;Comorbidity 3+    Comorbidities HTN, seizure like activity, DM, memory issues    Examination-Activity Limitations Caring for Others;Carry;Dressing;Lift;Locomotion Level;Stairs;Stand    Examination-Participation Restrictions Cleaning;Laundry;Meal Prep;Shop    Stability/Clinical Decision Making Evolving/Moderate complexity    Rehab Potential Good    PT Frequency 2x / week    PT Duration 6 weeks   was 3x a week for 4 weeks now requesting 2x a week for 6 weeks   PT Treatment/Interventions Gait training;Stair training;Functional mobility training;Therapeutic activities;Therapeutic exercise;Balance training;Neuromuscular re-education;Patient/family education    PT Next Visit Plan Continue to work on strength, ROM and balance give pt her new HEP ,(CJenny Reichmannhas)    PT Home Exercise Plan 1/10: AP and lateral weight shifts; 1/14: LAQ, sit to stand at kitchen table, bridge, dead bug and sidelying abduction; STS, hamstring stretch with quad set; 1/27: cervical and thoracic excursions.2/3: side stepping, marching and squat            Patient will benefit from skilled therapeutic intervention in order to improve the following deficits and impairments:  Abnormal gait,Decreased activity tolerance,Decreased balance,Decreased endurance,Decreased mobility,Decreased range of motion,Difficulty walking,Decreased strength,Pain  Visit Diagnosis: Muscle weakness (generalized)  Difficulty in walking, not elsewhere classified  Other abnormalities of gait and mobility     Problem List Patient Active Problem List   Diagnosis Date Noted  . Dysuria 10/18/2020  . Dementia ([place] 07/13/2020  . Cognitive deficits   . Infection   . Carotid bruit 05/26/2020  . Hyperlipidemia associated with type 2 diabetes mellitus ([place] 12/08/2019  . Iron deficiency 01/21/2017  . Vitamin D deficiency 10/15/2016  . Urinary incontinence 10/15/2016  . Limited mobility 05/27/2016  . Type 2 diabetes mellitus with hyperglycemia ([place] 02/04/2016  . Sleep terror disorder 12/14/2015  . Fecal incontinence 12/13/2015  .  Back pain of lumbar region with sciatica 04/15/2015  . Spondylosis, cervical, with myelopathy 04/14/2015  . OSA (obstructive sleep apnea) 04/10/2013  . Peripheral neuropathy 03/12/2013  . Hearing loss 11/06/2012  . Seizure-like activity ([place]) 01/25/2012  . UNSTEADY GAIT 11/07/2010  . Alpha thalassemia ([place]) 01/26/2010  . Morbid obesity ([place]) 04/21/2008  . Anxiety and depression 04/21/2008  . Essential hypertension 04/21/2008  . GERD 04/21/2008  . Osteoarthritis of left knee 04/21/2008  . URINARY INCONTINENCE 04/21/2008  Rayetta Humphrey, PT CLT [phone] 10/27/2020, 2:36 PM  [facility] [address] [place], [place], [address] Phone: [phone]   Fax:  [phone]  Name: CAMEY EDELL MRN: [id number] Date of Birth: 01/24/1941

## 2020-10-27 NOTE — Addendum Note (Signed)
Addended by: Leeroy Cha on: 10/27/2020 02:43 PM   Modules accepted: Orders

## 2020-11-01 ENCOUNTER — Ambulatory Visit (HOSPITAL_COMMUNITY): Payer: Medicare HMO | Admitting: Physical Therapy

## 2020-11-01 ENCOUNTER — Encounter (HOSPITAL_COMMUNITY): Payer: Self-pay | Admitting: Physical Therapy

## 2020-11-01 ENCOUNTER — Other Ambulatory Visit: Payer: Self-pay

## 2020-11-01 DIAGNOSIS — R262 Difficulty in walking, not elsewhere classified: Secondary | ICD-10-CM

## 2020-11-01 DIAGNOSIS — R2689 Other abnormalities of gait and mobility: Secondary | ICD-10-CM | POA: Diagnosis not present

## 2020-11-01 DIAGNOSIS — M6281 Muscle weakness (generalized): Secondary | ICD-10-CM | POA: Diagnosis not present

## 2020-11-01 NOTE — Therapy (Signed)
[facility] [place], [place], [address] Phone: [phone]   Fax:  [phone]  Physical Therapy Treatment  Patient Details  Name: Laura Atkins MRN: [id number] Date of Birth: 10-11-1940 Referring Provider (PT): Pilar Jarvis   Encounter Date: 11/01/2020   PT End of Session - 11/01/20 1608    Visit Number 11    Number of Visits 22    Date for PT Re-Evaluation 12/13/20    Authorization Type [employer] approved 12 visits from 09/29/20 to 10/27/20; requested 12 visits on 2/3 starting on 2/8    Authorization - Visit Number 11    Authorization - Number of Visits 12    Progress Note Due on Visit 20    PT Start Time 1608    PT Stop Time 5188   pt had to use restroom for 5 minutes during session   PT Time Calculation (min) 45 min    Equipment Utilized During Treatment Gait belt    Activity Tolerance Patient tolerated treatment well;Patient limited by pain;No increased pain;Patient limited by fatigue    Behavior During Therapy [facility] for tasks assessed/performed           Past Medical History:  Diagnosis Date  . Alpha thalassemia trait 01/26/2010   02/2012: Nl CBC ex H&H-10.7/34.8, MCV-69   . Anemia   . Anxiety   . Anxiety and depression   . Cellulitis 05/09/2017  . Depression   . Diabetes mellitus   . Foot pain, right 04/30/2013  . GERD (gastroesophageal reflux disease)   . Headache(784.0)   . Hyperlipidemia   . Hypertension   . Iron deficiency 01/21/2017  . Microcytic anemia 01/26/2010   02/2012: Nl CBC ex H&H-10.7/34.8, MCV-69   . NECK PAIN, CHRONIC 10/21/2008   +chronic back pain    . Obesity   . Obstructive sleep apnea   . Osteoarthritis    Left knee; right shoulder; chronic neck and back pain  . Pruritus   . PVD (peripheral vascular disease) ([place]) 01/28/2014  . Seizures ([place])   . Shoulder pain, right 04/14/2015  . Tremor    This started months ago after her seizure progressing to very poor hand writing  . Urinary incontinence    . UTI (urinary tract infection) 01/18/2013    Past Surgical History:  Procedure Laterality Date  . ABDOMINAL HYSTERECTOMY    . BREAST EXCISIONAL BIOPSY     Left; cyst  . CATARACT EXTRACTION Right    12/2017  . CATARACT EXTRACTION W/ INTRAOCULAR LENS IMPLANT Left 09/07/2013  . CHOLECYSTECTOMY    . COLONOSCOPY    . COLONOSCOPY N/A 07/20/2015   Procedure: COLONOSCOPY;  Surgeon: Rogene Houston, MD;  Location[facility];  Service: Endoscopy;  Laterality: N/A;  930  . EYE SURGERY Left 09/07/2013   cataract    There were no vitals filed for this visit.   Subjective Assessment - 11/01/20 1615    Subjective States that she has some pain in her right leg 6/10 noted along the back side of the thigh. States that it feels like its on the back side of the thigh.    Pertinent History HTN, DM, AKI seizure like activity.    How long can you sit comfortably? no problem    How long can you stand comfortably? tested with no assisted device pt needs contact guard for 1:55    How long can you walk comfortably? less than two minutes    Currently in Pain? Yes  Pain Score 6     Pain Location Leg    Pain Orientation Right    Pain Descriptors / Indicators Aching;Tightness              [facility] PT Assessment - 11/01/20 0001      Assessment   Medical Diagnosis Difficulty walking    Referring Provider (PT) Pilar Jarvis    Onset Date/Surgical Date 05/27/20                         [facility] Adult PT Treatment/Exercise - 11/01/20 0001      Ambulation/Gait   Ambulation Distance (Feet) 85 Feet    Assistive device Rolling walker    Gait Pattern Decreased stride length    Gait Comments x2      Knee/Hip Exercises: Stretches   Active Hamstring Stretch 3 reps;30 seconds;Right      Knee/Hip Exercises: Seated   Long Arc Quad Both;15 reps   1# weights and 3 " hold   Sit to Sand --   hands on thighs - elevated MAt table - - 15 minutes with verbal, tactile cues and min assist as needed                  PT Education - 11/01/20 1635    Education Details on how to stand up - hip flexion getting chest over toes and then bringing butt forwards, on how to safely sit and stand    Person(s) Educated Patient    Methods Explanation    Comprehension Verbalized understanding            PT Short Term Goals - 10/27/20 1436      PT SHORT TERM GOAL #1   Title Patient will independently verbalize and demo initial HEP in order to improve B LE strength with ambulation and transfers.     Time 2    Status Achieved    Target Date 10/13/20      PT SHORT TERM GOAL #2   Title PT will be able to complete 5 sit to stand in a 30 second period using UE assist to demonstrate improved power.    Baseline 2/3: 3 in 30 seconds    Time 2    Period Weeks    Status Not Met      PT SHORT TERM GOAL #3   Title PT  will be able to ambulate 142f with a RW in 2 minute time period to demonstrate reduce risk of falling.    Time 2    Period Weeks    Status Achieved      PT SHORT TERM GOAL #4   Status On-going             PT Long Term Goals - 10/27/20 1407      PT LONG TERM GOAL #1   Title PT to be able to come sit to stand without the use of UE to demonstrate improved balance and strength.    Time 4    Period Weeks    Status On-going      PT LONG TERM GOAL #2   Title PT to be able to single leg stance on both LE for 5 seconds to decrease risk of falling    Time 4    Period Weeks    Status On-going      PT LONG TERM GOAL #3   Title PT to be able to walk 2030fwith a rolling walker in  a 2 minute timeframe to allow pt to be able to make it to the restroom in time without having an accident    Time 4    Status On-going      PT LONG TERM GOAL #4   Status On-going      PT LONG TERM GOAL #5   Status On-going                 Plan - 11/01/20 1659    Clinical Impression Statement Continued weakness noted with functional tasks. Even from elevated surface patient fearful  of standing up without use of hands. On last sit to stand patient's legs gave out from under her and she was guided back to sitting on the mat table. Continued difficulties with standing secondary to not being able to straighten her knees with standing and generalized weakness. Unable to practice standing exercises secondary to knees giving out and increased weakness and pain. Reviewed previous HEP with patient and husband (exercises she performed last session) and instructed her to perform at the counter for safety if she feels stronger.    Personal Factors and Comorbidities Fitness;Past/Current Experience;Comorbidity 3+    Comorbidities HTN, seizure like activity, DM, memory issues    Examination-Activity Limitations Caring for Others;Carry;Dressing;Lift;Locomotion Level;Stairs;Stand    Examination-Participation Restrictions Cleaning;Laundry;Meal Prep;Shop    Stability/Clinical Decision Making Evolving/Moderate complexity    Rehab Potential Good    PT Frequency 2x / week    PT Duration 6 weeks   was 3x a week for 4 weeks now requesting 2x a week for 6 weeks   PT Treatment/Interventions Gait training;Stair training;Functional mobility training;Therapeutic activities;Therapeutic exercise;Balance training;Neuromuscular re-education;Patient/family education    PT Next Visit Plan Continue to work on strength, ROM and balance give pt her new HEP ,(Jenny Reichmann has)    PT Home Exercise Plan 1/10: AP and lateral weight shifts; 1/14: LAQ, sit to stand at kitchen table, bridge, dead bug and sidelying abduction; STS, hamstring stretch with quad set; 1/27: cervical and thoracic excursions.2/3: side stepping, marching and squat           Patient will benefit from skilled therapeutic intervention in order to improve the following deficits and impairments:  Abnormal gait,Decreased activity tolerance,Decreased balance,Decreased endurance,Decreased mobility,Decreased range of motion,Difficulty walking,Decreased  strength,Pain  Visit Diagnosis: Muscle weakness (generalized)  Difficulty in walking, not elsewhere classified  Other abnormalities of gait and mobility     Problem List Patient Active Problem List   Diagnosis Date Noted  . Dysuria 10/18/2020  . Dementia ([place]) 07/13/2020  . Cognitive deficits   . Infection   . Carotid bruit 05/26/2020  . Hyperlipidemia associated with type 2 diabetes mellitus ([place]) 12/08/2019  . Iron deficiency 01/21/2017  . Vitamin D deficiency 10/15/2016  . Urinary incontinence 10/15/2016  . Limited mobility 05/27/2016  . Type 2 diabetes mellitus with hyperglycemia ([place]) 02/04/2016  . Sleep terror disorder 12/14/2015  . Fecal incontinence 12/13/2015  . Back pain of lumbar region with sciatica 04/15/2015  . Spondylosis, cervical, with myelopathy 04/14/2015  . OSA (obstructive sleep apnea) 04/10/2013  . Peripheral neuropathy 03/12/2013  . Hearing loss 11/06/2012  . Seizure-like activity ([place]) 01/25/2012  . UNSTEADY GAIT 11/07/2010  . Alpha thalassemia ([place]) 01/26/2010  . Morbid obesity ([place]) 04/21/2008  . Anxiety and depression 04/21/2008  . Essential hypertension 04/21/2008  . GERD 04/21/2008  . Osteoarthritis of left knee 04/21/2008  . URINARY INCONTINENCE 04/21/2008    5:00 PM, 11/01/20 Jerene Pitch, DPT Physical Therapy with [facility]  [phone] office  [facility] [address] [place], [place], [address] Phone: [phone]   Fax:  [phone]  Name: Laura Atkins MRN: [id number] Date of Birth: 1941/04/22

## 2020-11-03 ENCOUNTER — Ambulatory Visit (HOSPITAL_COMMUNITY): Payer: Medicare HMO | Admitting: Physical Therapy

## 2020-11-03 ENCOUNTER — Ambulatory Visit: Payer: Medicare HMO | Admitting: Internal Medicine

## 2020-11-03 ENCOUNTER — Telehealth (HOSPITAL_COMMUNITY): Payer: Self-pay | Admitting: Physical Therapy

## 2020-11-03 NOTE — Telephone Encounter (Signed)
pt's husnabd came in to the office to cx today's appt since the pt has wet her clothes and she wants to go home.

## 2020-11-04 ENCOUNTER — Ambulatory Visit (INDEPENDENT_AMBULATORY_CARE_PROVIDER_SITE_OTHER): Payer: Medicare HMO | Admitting: *Deleted

## 2020-11-04 ENCOUNTER — Other Ambulatory Visit: Payer: Self-pay

## 2020-11-04 DIAGNOSIS — R300 Dysuria: Secondary | ICD-10-CM

## 2020-11-04 LAB — POCT URINALYSIS DIP (CLINITEK)
Bilirubin, UA: NEGATIVE
Glucose, UA: NEGATIVE mg/dL
Ketones, POC UA: NEGATIVE mg/dL
Nitrite, UA: NEGATIVE
POC PROTEIN,UA: 100 — AB
Spec Grav, UA: <=1.005 — AB (ref 1.010–1.025)
Urobilinogen, UA: 0.2 E.U./dL
pH, UA: 5 (ref 5.0–8.0)

## 2020-11-06 LAB — URINE CULTURE

## 2020-11-07 ENCOUNTER — Telehealth: Payer: Self-pay

## 2020-11-07 NOTE — Telephone Encounter (Signed)
Will talk to her during the visit tomorrow. Thank you.

## 2020-11-07 NOTE — Telephone Encounter (Signed)
Please call the pt to advise of the urine results

## 2020-11-07 NOTE — Telephone Encounter (Signed)
Please advise 

## 2020-11-08 ENCOUNTER — Other Ambulatory Visit: Payer: Self-pay

## 2020-11-08 ENCOUNTER — Telehealth (HOSPITAL_COMMUNITY): Payer: Self-pay | Admitting: Physical Therapy

## 2020-11-08 ENCOUNTER — Ambulatory Visit (HOSPITAL_COMMUNITY): Payer: Medicare HMO | Admitting: Physical Therapy

## 2020-11-08 ENCOUNTER — Telehealth (INDEPENDENT_AMBULATORY_CARE_PROVIDER_SITE_OTHER): Payer: Medicare HMO | Admitting: Internal Medicine

## 2020-11-08 ENCOUNTER — Encounter: Payer: Self-pay | Admitting: Internal Medicine

## 2020-11-08 VITALS — BP 122/58 | HR 68 | Resp 16

## 2020-11-08 DIAGNOSIS — N3000 Acute cystitis without hematuria: Secondary | ICD-10-CM | POA: Diagnosis not present

## 2020-11-08 MED ORDER — CIPROFLOXACIN HCL 500 MG PO TABS: 500.0000 mg | ORAL_TABLET | Freq: Two times a day (BID) | ORAL | 0 refills | Status: DC

## 2020-11-08 NOTE — Progress Notes (Signed)
Established Patient Office Visit  Subjective:  Patient ID: Laura Atkins, female    DOB: [birth year]  Age: 80 y.o. MRN: [id number]  CC:  Chief Complaint  Patient presents with  . Urinary Tract Infection    Pt has felt like she had a uti since last visit never went away and seems worse dropped off urine last week please see nurse visit     HPI [name] presents for evaluation of dysuria and lower abdominal pressure for about 2 weeks. She denies any fever, chills, nausea, vomiting, or constipation.  She has had recurrent UTI in the past, treated with Macrobid. She denies any h/o urinary stones.  Past Medical History:  Diagnosis Date  . Alpha thalassemia trait 01/26/2010   02/2012: Nl CBC ex H&H-10.7/34.8, MCV-69   . Anemia   . Anxiety   . Anxiety and depression   . Cellulitis 05/09/2017  . Depression   . Diabetes mellitus   . Foot pain, right 04/30/2013  . GERD (gastroesophageal reflux disease)   . Headache(784.0)   . Hyperlipidemia   . Hypertension   . Iron deficiency 01/21/2017  . Microcytic anemia 01/26/2010   02/2012: Nl CBC ex H&H-10.7/34.8, MCV-69   . NECK PAIN, CHRONIC 10/21/2008   +chronic back pain    . Obesity   . Obstructive sleep apnea   . Osteoarthritis    Left knee; right shoulder; chronic neck and back pain  . Pruritus   . PVD (peripheral vascular disease) ([place]) 01/28/2014  . Seizures ([place])   . Shoulder pain, right 04/14/2015  . Tremor    This started months ago after her seizure progressing to very poor hand writing  . Urinary incontinence   . UTI (urinary tract infection) 01/18/2013    Past Surgical History:  Procedure Laterality Date  . ABDOMINAL HYSTERECTOMY    . BREAST EXCISIONAL BIOPSY     Left; cyst  . CATARACT EXTRACTION Right    12/2017  . CATARACT EXTRACTION W/ INTRAOCULAR LENS IMPLANT Left 09/07/2013  . CHOLECYSTECTOMY    . COLONOSCOPY    . COLONOSCOPY N/A 07/20/2015   Procedure: COLONOSCOPY;  Surgeon: Rogene Houston, MD;  [facility];  Service: Endoscopy;  Laterality: N/A;  930  . EYE SURGERY Left 09/07/2013   cataract    Family History  Problem Relation Age of Onset  . Lung cancer Mother 89  . Kidney disease Father   . Diabetes Sister   . Keloids Brother   . ADD / ADHD Grandchild   . Bipolar disorder Grandchild   . Bipolar disorder Daughter   . Seizures Daughter   . Heart disease Daughter   . Kidney disease Son   . Neuropathy Son   . Kidney disease Son   . Edema Daughter   . Breast cancer Daughter 43  . Allergies Daughter   . Alcohol abuse Neg Hx   . Drug abuse Neg Hx     Social History   Socioeconomic History  . Marital status: Married    Spouse name: saunders  . Number of children: 5  . Years of education: 49  . Highest education level: Not on file  Occupational History  . Occupation: Disabled     Employer: RETIRED  Tobacco Use  . Smoking status: Never Smoker  . Smokeless tobacco: Never Used  Vaping Use  . Vaping Use: Never used  Substance and Sexual Activity  . Alcohol use: No    Alcohol/week: 0.0 standard drinks  .  Drug use: No  . Sexual activity: Yes    Birth control/protection: Surgical  Other Topics Concern  . Not on file  Social History Narrative   07/27/20 Patient lives at home with her husband [name] and dgtrIvin Booty.  Patient is retired.    Right handed.    Five Children.    Caffeine- 2 daily   Social Determinants of Health   Financial Resource Strain: Low Risk   . Difficulty of Paying Living Expenses: Not very hard  Food Insecurity: No Food Insecurity  . Worried About Charity fundraiser in the Last Year: Never true  . Ran Out of Food in the Last Year: Never true  Transportation Needs: No Transportation Needs  . Lack of Transportation (Medical): No  . Lack of Transportation (Non-Medical): No  Physical Activity: Inactive  . Days of Exercise per Week: 0 days  . Minutes of Exercise per Session: 0 min  Stress: No Stress Concern Present  . Feeling of  Stress : Only a little  Social Connections: Moderately Integrated  . Frequency of Communication with Friends and Family: Three times a week  . Frequency of Social Gatherings with Friends and Family: Three times a week  . Attends Religious Services: More than 4 times per year  . Active Member of Clubs or Organizations: No  . Attends Archivist Meetings: Never  . Marital Status: Married  Human resources officer Violence: Not on file    Outpatient Medications Prior to Visit  Medication Sig Dispense Refill  . Accu-Chek FastClix Lancets MISC 1 each by Does not apply route 4 (four) times daily. Use to monitor glucose levels 4 times daily; E11.65 306 each 2  . Alcohol Swabs (B-D SINGLE USE SWABS REGULAR) PADS 1 each by Does not apply route 4 (four) times daily. Use to prep site for glucose monitoring 4 times daily; E11.65 300 each 2  . amLODipine (NORVASC) 5 MG tablet TAKE 1 TABLET BY MOUTH EVERY DAY 30 tablet 3  . aspirin 81 MG tablet Take 81 mg by mouth daily.    Marland Kitchen atorvastatin (LIPITOR) 40 MG tablet TAKE 1 TABLET BY MOUTH EVERY DAY 30 tablet 2  . benazepril (LOTENSIN) 20 MG tablet     . Blood Glucose Monitoring Suppl (ACCU-CHEK GUIDE ME) w/Device KIT 1 each by Does not apply route 4 (four) times daily. Use to monitor glucose levels 4 times daily; E11.65 1 kit 0  . Cholecalciferol 125 MCG (5000 UT) TABS Take 1 tablet by mouth daily.    . Continuous Blood Gluc Sensor (FREESTYLE LIBRE 14 DAY SENSOR) MISC 1 Device by Other route every 14 (fourteen) days. 6 each 3  . diphenhydrAMINE (BENADRYL) 25 MG tablet Take 25 mg by mouth at bedtime.    . donepezil (ARICEPT) 5 MG tablet TAKE 1 TABLET BY MOUTH AT BEDTIME 30 tablet 2  . ferrous sulfate 325 (65 FE) MG tablet Take 1 tablet (325 mg total) by mouth daily with breakfast. 30 tablet 5  . gabapentin (NEURONTIN) 400 MG capsule TAKE ONE CAPSULE BY MOUTH EVERY MORNING and TAKE ONE CAPSULE AT NOON and TAKE TWO CAPSULES AT BEDTIME 360 capsule 1  . glucose  blood (ACCU-CHEK GUIDE) test strip 1 each by Other route 4 (four) times daily. 200 strip 12  . insulin NPH-regular Human (NOVOLIN 70/30 RELION) (70-30) 100 UNIT/ML injection 25 units with breakfast, and 10 units with supper. (Patient taking differently: 20 units with breakfast, and 10 units with supper.) 20 mL 11  .  Insulin Syringe-Needle U-100 (INSULIN SYRINGE 1CC/31GX5/16") 31G X 5/16" 1 ML MISC 1 each by Does not apply route daily. Use to inject insulin daily; E11.65 90 each 2  . Multiple Vitamin (MULTIVITAMIN) capsule Take 1 capsule by mouth daily.    Marland Kitchen omeprazole (PRILOSEC) 20 MG capsule TAKE ONE CAPSULE BY MOUTH EVERY DAY 30 capsule 2  . potassium chloride SA (KLOR-CON) 20 MEQ tablet TAKE 1 TABLET BY MOUTH TWICE DAILY 60 tablet 1  . sertraline (ZOLOFT) 100 MG tablet Take 1 tablet (100 mg total) by mouth daily. 60 tablet 5  . torsemide (DEMADEX) 20 MG tablet Take 2 tablets (40 mg total) by mouth daily. 60 tablet 6  . UNABLE TO FIND Walker x 1  DX unsteady gait, osteoarthritis of left knee 1 each 0  . UNABLE TO FIND Elevated [place] Seat x 1 DX: unsteady gait, back pain, osteoarthritis of left knee 1 each 0  . UNABLE TO FIND Standing upright walker x 1  DX M54.40, M19.90 1 each 0  . UNABLE TO FIND Incontinence pads and supplies 1 each 1  . nitrofurantoin (MACRODANTIN) 100 MG capsule Take 100 mg by mouth 4 (four) times daily. Started on 07/26/20    . nitrofurantoin, macrocrystal-monohydrate, (MACROBID) 100 MG capsule Take 1 capsule (100 mg total) by mouth 2 (two) times daily. 14 capsule 0   No facility-administered medications prior to visit.    Allergies  Allergen Reactions  . Penicillins Shortness Of Breath, Itching and Rash  . Prednisone Shortness Of Breath, Itching and Rash  . Propoxyphene N-Acetaminophen Itching and Nausea And Vomiting  . Sulfa Antibiotics Itching and Nausea And Vomiting    ROS Review of Systems  Constitutional: Negative for chills and fever.  HENT: Negative for  congestion, sinus pressure, sinus pain and sore throat.   Eyes: Negative for pain and discharge.  Respiratory: Negative for cough and shortness of breath.   Cardiovascular: Negative for chest pain and palpitations.  Gastrointestinal: Positive for abdominal pain (Lower (suprapubic)). Negative for constipation, diarrhea, nausea and vomiting.  Endocrine: Negative for polydipsia and polyuria.  Genitourinary: Positive for dysuria. Negative for hematuria.  Musculoskeletal: Negative for neck pain and neck stiffness.  Skin: Negative for rash.  Neurological: Negative for dizziness and weakness.  Psychiatric/Behavioral: Negative for agitation and behavioral problems.      Objective:    Physical Exam Vitals reviewed.  Constitutional:      General: She is not in acute distress.    Appearance: She is not diaphoretic.  HENT:     Head: Normocephalic and atraumatic.     Nose: Nose normal. No congestion.     Mouth/Throat:     Mouth: Mucous membranes are moist.     Pharynx: No posterior oropharyngeal erythema.  Eyes:     General: No scleral icterus.    Extraocular Movements: Extraocular movements intact.     Pupils: Pupils are equal, round, and reactive to light.  Cardiovascular:     Rate and Rhythm: Normal rate and regular rhythm.     Pulses: Normal pulses.     Heart sounds: Normal heart sounds. No murmur heard.   Pulmonary:     Breath sounds: Normal breath sounds. No wheezing or rales.  Abdominal:     Palpations: Abdomen is soft.     Tenderness: There is abdominal tenderness (Suprapubic). There is no right CVA tenderness or left CVA tenderness.  Musculoskeletal:     Cervical back: Neck supple. No tenderness.     Right lower leg:  No edema.     Left lower leg: No edema.  Skin:    General: Skin is warm.     Findings: No rash.  Neurological:     General: No focal deficit present.     Mental Status: She is alert and oriented to person, place, and time.  Psychiatric:        Mood and  Affect: Mood normal.        Behavior: Behavior normal.     BP (!) 122/58 (BP Location: Right Arm, Patient Position: Sitting, Cuff Size: Normal)   Pulse 68   Resp 16   SpO2 97%  Wt Readings from Last 3 Encounters:  10/18/20 260 lb (117.9 kg)  09/27/20 258 lb (117 kg)  09/07/20 255 lb (115.7 kg)     Health Maintenance Due  Topic Date Due  . Hepatitis C Screening  Never done  . COVID-19 Vaccine (1) Never done    There are no preventive care reminders to display for this patient.  Lab Results  Component Value Date   TSH 1.129 05/27/2020   Lab Results  Component Value Date   WBC 6.3 06/03/2020   HGB 9.6 (L) 06/03/2020   HCT 33.7 (L) 06/03/2020   MCV 69.3 (L) 06/03/2020   PLT 170 06/03/2020   Lab Results  Component Value Date   NA 138 06/03/2020   K 3.7 06/03/2020   CO2 26 06/03/2020   GLUCOSE 145 (H) 06/03/2020   BUN 19 06/03/2020   CREATININE 1.13 (H) 06/03/2020   BILITOT 0.5 06/03/2020   ALKPHOS 76 06/03/2020   AST 35 06/03/2020   ALT 38 06/03/2020   PROT 7.1 06/03/2020   ALBUMIN 3.6 06/03/2020   CALCIUM 9.5 06/03/2020   ANIONGAP 11 06/03/2020   Lab Results  Component Value Date   CHOL 149 12/01/2019   Lab Results  Component Value Date   HDL 58 12/01/2019   Lab Results  Component Value Date   LDLCALC 72 12/01/2019   Lab Results  Component Value Date   TRIG 100 12/01/2019   Lab Results  Component Value Date   CHOLHDL 2.6 12/01/2019   Lab Results  Component Value Date   HGBA1C 8.3 (A) 06/30/2020      Assessment & Plan:   Problem List Items Addressed This Visit   None   Visit Diagnoses    Acute cystitis without hematuria    -  Primary UA positive for LE and RBCs Although culture negative, but she was taking Azo. Considering h/o dysuria and suprapubic tenderness, prescribed Ciprofloxacin Allergic to penicillin and sulfa drugs, has tried Macrobid in the past If persistent symptoms, will order imaging to rule out bladder or kidney  pathology including nephrolithiasis   Relevant Medications   ciprofloxacin (CIPRO) 500 MG tablet      Meds ordered this encounter  Medications  . ciprofloxacin (CIPRO) 500 MG tablet    Sig: Take 1 tablet (500 mg total) by mouth 2 (two) times daily.    Dispense:  6 tablet    Refill:  0    Follow-up: Return if symptoms worsen or fail to improve.    Lindell Spar, MD

## 2020-11-08 NOTE — Telephone Encounter (Signed)
pt cancelled appt for today because she does not feel well

## 2020-11-08 NOTE — Patient Instructions (Signed)
Please start taking Ciprofloxacin for UTI.  Please increase water intake to at least 55 ounces in a day.  Okay to take Azo for burning pain.

## 2020-11-11 ENCOUNTER — Other Ambulatory Visit: Payer: Self-pay

## 2020-11-11 ENCOUNTER — Ambulatory Visit (HOSPITAL_COMMUNITY): Payer: Medicare HMO

## 2020-11-11 ENCOUNTER — Encounter (HOSPITAL_COMMUNITY): Payer: Self-pay

## 2020-11-11 DIAGNOSIS — R262 Difficulty in walking, not elsewhere classified: Secondary | ICD-10-CM

## 2020-11-11 DIAGNOSIS — R2689 Other abnormalities of gait and mobility: Secondary | ICD-10-CM

## 2020-11-11 DIAGNOSIS — M6281 Muscle weakness (generalized): Secondary | ICD-10-CM

## 2020-11-11 NOTE — Therapy (Signed)
[facility] [address] [place], [place], [address] Phone: [phone]   Fax:  [phone]  Physical Therapy Treatment  Patient Details  Name: Laura Atkins MRN: [id number] Date of Birth: Jan 06, 1941 Referring Provider (PT): Pilar Jarvis   Encounter Date: 11/11/2020   PT End of Session - 11/11/20 1324    Visit Number 12    Number of Visits 22    Date for PT Re-Evaluation 12/13/20    Authorization Type [employer] approved 12 visits from 09/29/20 to 10/27/20; requested 12 visits on 2/3 starting on 2/8    Authorization - Visit Number 2    Authorization - Number of Visits 12    Progress Note Due on Visit 20    PT Start Time 1318    PT Stop Time 1402    PT Time Calculation (min) 44 min    Equipment Utilized During Treatment Gait belt    Activity Tolerance Patient limited by fatigue;Patient tolerated treatment well    Behavior During Therapy [facility] for tasks assessed/performed           Past Medical History:  Diagnosis Date  . Alpha thalassemia trait 01/26/2010   02/2012: Nl CBC ex H&H-10.7/34.8, MCV-69   . Anemia   . Anxiety   . Anxiety and depression   . Cellulitis 05/09/2017  . Depression   . Diabetes mellitus   . Foot pain, right 04/30/2013  . GERD (gastroesophageal reflux disease)   . Headache(784.0)   . Hyperlipidemia   . Hypertension   . Iron deficiency 01/21/2017  . Microcytic anemia 01/26/2010   02/2012: Nl CBC ex H&H-10.7/34.8, MCV-69   . NECK PAIN, CHRONIC 10/21/2008   +chronic back pain    . Obesity   . Obstructive sleep apnea   . Osteoarthritis    Left knee; right shoulder; chronic neck and back pain  . Pruritus   . PVD (peripheral vascular disease) ([place]) 01/28/2014  . Seizures ([place])   . Shoulder pain, right 04/14/2015  . Tremor    This started months ago after her seizure progressing to very poor hand writing  . Urinary incontinence   . UTI (urinary tract infection) 01/18/2013    Past Surgical History:  Procedure Laterality  Date  . ABDOMINAL HYSTERECTOMY    . BREAST EXCISIONAL BIOPSY     Left; cyst  . CATARACT EXTRACTION Right    12/2017  . CATARACT EXTRACTION W/ INTRAOCULAR LENS IMPLANT Left 09/07/2013  . CHOLECYSTECTOMY    . COLONOSCOPY    . COLONOSCOPY N/A 07/20/2015   Procedure: COLONOSCOPY;  Surgeon: Rogene Houston, MD;  [facility];  Service: Endoscopy;  Laterality: N/A;  930  . EYE SURGERY Left 09/07/2013   cataract    There were no vitals filed for this visit.   Subjective Assessment - 11/11/20 1324    Subjective Pt stated she hasn't completed a lot of exercises while she was sick.  Reports feeling better today.    Patient Stated Goals to walk better    Currently in Pain? No/denies                             [facility] Adult PT Treatment/Exercise - 11/11/20 0001      Ambulation/Gait   Ambulation Distance (Feet) 60 Feet    Assistive device Rolling walker    Gait Pattern Decreased stride length;Shuffle      Exercises   Exercises Knee/Hip  Knee/Hip Exercises: Standing   Other Standing Knee Exercises sidestep front of mat      Knee/Hip Exercises: Seated   Sit to Sand 2 sets;5 reps;without UE support   elevated height     Knee/Hip Exercises: Supine   Bridges 2 sets;5 reps    Bridges Limitations 3" holds      Knee/Hip Exercises: Prone   Hamstring Curl 10 reps    Hip Extension Both;10 reps    Hip Extension Limitations 3" holds initial required AAROM then AROM                    PT Short Term Goals - 10/27/20 1436      PT SHORT TERM GOAL #1   Title Patient will independently verbalize and demo initial HEP in order to improve B LE strength with ambulation and transfers.     Time 2    Status Achieved    Target Date 10/13/20      PT SHORT TERM GOAL #2   Title PT will be able to complete 5 sit to stand in a 30 second period using UE assist to demonstrate improved power.    Baseline 2/3: 3 in 30 seconds    Time 2    Period Weeks    Status  Not Met      PT SHORT TERM GOAL #3   Title PT  will be able to ambulate 162f with a RW in 2 minute time period to demonstrate reduce risk of falling.    Time 2    Period Weeks    Status Achieved      PT SHORT TERM GOAL #4   Status On-going             PT Long Term Goals - 10/27/20 1407      PT LONG TERM GOAL #1   Title PT to be able to come sit to stand without the use of UE to demonstrate improved balance and strength.    Time 4    Period Weeks    Status On-going      PT LONG TERM GOAL #2   Title PT to be able to single leg stance on both LE for 5 seconds to decrease risk of falling    Time 4    Period Weeks    Status On-going      PT LONG TERM GOAL #3   Title PT to be able to walk 2089fwith a rolling walker in a 2 minute timeframe to allow pt to be able to make it to the restroom in time without having an accident    Time 4    Status On-going      PT LONG TERM GOAL #4   Status On-going      PT LONG TERM GOAL #5   Status On-going                 Plan - 11/11/20 1351    Clinical Impression Statement Pt continues to present wiht significant weakness wiht functional tasks.  Required elevated height wiht standing and fearful to stand without use of hands.  Pt easily fatigued required seated rest break through session.  Pt required cueing for knee extension, open eyes and to look up wiht sit to stand, min-mod assistance for static balance upon standing for safety.  Therex focus with gluteal strengthening exercises to improve functional tasks.  Ambulates with extrememly slow shuffled gait, cueing to pick feet up and increase stride  length for safety and energy conservation.    Personal Factors and Comorbidities Fitness;Past/Current Experience;Comorbidity 3+    Comorbidities HTN, seizure like activity, DM, memory issues    Examination-Activity Limitations Caring for Others;Carry;Dressing;Lift;Locomotion Level;Stairs;Stand    Examination-Participation Restrictions  Cleaning;Laundry;Meal Prep;Shop    Stability/Clinical Decision Making Evolving/Moderate complexity    Clinical Decision Making Moderate    Rehab Potential Good    PT Frequency 2x / week    PT Duration 6 weeks   was 3x a week for 4 weeks now requesting 2x a week for 6 weeks   PT Treatment/Interventions Gait training;Stair training;Functional mobility training;Therapeutic activities;Therapeutic exercise;Balance training;Neuromuscular re-education;Patient/family education    PT Next Visit Plan Continue to work on strength, ROM and balance    PT Home Exercise Plan 1/10: AP and lateral weight shifts; 1/14: LAQ, sit to stand at kitchen table, bridge, dead bug and sidelying abduction; STS, hamstring stretch with quad set; 1/27: cervical and thoracic excursions.2/3: side stepping, marching and squat           Patient will benefit from skilled therapeutic intervention in order to improve the following deficits and impairments:  Abnormal gait,Decreased activity tolerance,Decreased balance,Decreased endurance,Decreased mobility,Decreased range of motion,Difficulty walking,Decreased strength,Pain  Visit Diagnosis: Muscle weakness (generalized)  Difficulty in walking, not elsewhere classified  Other abnormalities of gait and mobility     Problem List Patient Active Problem List   Diagnosis Date Noted  . Dysuria 10/18/2020  . Dementia ([place]) 07/13/2020  . Cognitive deficits   . Infection   . Carotid bruit 05/26/2020  . Hyperlipidemia associated with type 2 diabetes mellitus ([place]) 12/08/2019  . Iron deficiency 01/21/2017  . Vitamin D deficiency 10/15/2016  . Urinary incontinence 10/15/2016  . Limited mobility 05/27/2016  . Type 2 diabetes mellitus with hyperglycemia ([place]) 02/04/2016  . Sleep terror disorder 12/14/2015  . Fecal incontinence 12/13/2015  . Back pain of lumbar region with sciatica 04/15/2015  . Spondylosis, cervical, with myelopathy 04/14/2015  . OSA (obstructive sleep apnea)  04/10/2013  . Peripheral neuropathy 03/12/2013  . Hearing loss 11/06/2012  . Seizure-like activity ([place]) 01/25/2012  . UNSTEADY GAIT 11/07/2010  . Alpha thalassemia ([place]) 01/26/2010  . Morbid obesity ([place]) 04/21/2008  . Anxiety and depression 04/21/2008  . Essential hypertension 04/21/2008  . GERD 04/21/2008  . Osteoarthritis of left knee 04/21/2008  . URINARY INCONTINENCE 04/21/2008   Ihor Austin, LPTA/CLT; CBIS [phone]  Aldona Lento 11/11/2020, 2:24 PM  [facility] [address] [place], [place], [address] Phone: [phone]   Fax:  [phone]  Name: Laura Atkins MRN: [id number] Date of Birth: Feb 07, 1941

## 2020-11-14 ENCOUNTER — Telehealth: Payer: Self-pay

## 2020-11-14 NOTE — Telephone Encounter (Signed)
They are wanting a walker and [place]

## 2020-11-14 NOTE — Telephone Encounter (Signed)
We need a separate visit for documentation. She was seen for dysuria and added on a walker and all that stuff, so if she needs documentation for a walker she needs a separate visit for that.

## 2020-11-14 NOTE — Telephone Encounter (Signed)
Pt needs home health care & she needs a new walker

## 2020-11-14 NOTE — Telephone Encounter (Signed)
For the walker her last ov note with [facility] would need to be addended to state the need for walker and faxed with the prescription. Also requesting home health but looks like she is getting PT at [facility] outpatient that [facility] ordered.

## 2020-11-14 NOTE — Telephone Encounter (Signed)
Please schedule face to face, so we can initiate home health and walker.

## 2020-11-15 ENCOUNTER — Other Ambulatory Visit: Payer: Self-pay

## 2020-11-15 ENCOUNTER — Ambulatory Visit (HOSPITAL_COMMUNITY): Payer: Medicare HMO | Admitting: Physical Therapy

## 2020-11-15 ENCOUNTER — Encounter (HOSPITAL_COMMUNITY): Payer: Self-pay | Admitting: Physical Therapy

## 2020-11-15 DIAGNOSIS — M6281 Muscle weakness (generalized): Secondary | ICD-10-CM | POA: Diagnosis not present

## 2020-11-15 DIAGNOSIS — R2689 Other abnormalities of gait and mobility: Secondary | ICD-10-CM

## 2020-11-15 DIAGNOSIS — R262 Difficulty in walking, not elsewhere classified: Secondary | ICD-10-CM

## 2020-11-15 NOTE — Therapy (Signed)
[place], [place], [address] Phone: [phone]   Fax:  [phone]  Physical Therapy Treatment  Patient Details  Name: Laura Atkins MRN: [id number] Date of Birth: October 02, 1940 Referring Provider (PT): Pilar Jarvis   Encounter Date: 11/15/2020   PT End of Session - 11/15/20 1417    Visit Number 13    Number of Visits 22    Date for PT Re-Evaluation 12/13/20    Authorization Type [employer] approved 12 visits from 09/29/20 to 10/27/20; 12 visits approved from 11/01/20 to 12/08/20    Authorization - Visit Number 3    Authorization - Number of Visits 12    Progress Note Due on Visit 20    PT Start Time 1406   pt had to use rest room but educated pt's husband during this tim   PT Stop Time 1441    PT Time Calculation (min) 35 min    Equipment Utilized During Treatment Gait belt    Activity Tolerance Patient limited by fatigue;Patient tolerated treatment well    Behavior During Therapy [facility] for tasks assessed/performed           Past Medical History:  Diagnosis Date  . Alpha thalassemia trait 01/26/2010   02/2012: Nl CBC ex H&H-10.7/34.8, MCV-69   . Anemia   . Anxiety   . Anxiety and depression   . Cellulitis 05/09/2017  . Depression   . Diabetes mellitus   . Foot pain, right 04/30/2013  . GERD (gastroesophageal reflux disease)   . Headache(784.0)   . Hyperlipidemia   . Hypertension   . Iron deficiency 01/21/2017  . Microcytic anemia 01/26/2010   02/2012: Nl CBC ex H&H-10.7/34.8, MCV-69   . NECK PAIN, CHRONIC 10/21/2008   +chronic back pain    . Obesity   . Obstructive sleep apnea   . Osteoarthritis    Left knee; right shoulder; chronic neck and back pain  . Pruritus   . PVD (peripheral vascular disease) ([place]) 01/28/2014  . Seizures ([place])   . Shoulder pain, right 04/14/2015  . Tremor    This started months ago after her seizure progressing to very poor hand writing  . Urinary incontinence   . UTI (urinary tract  infection) 01/18/2013    Past Surgical History:  Procedure Laterality Date  . ABDOMINAL HYSTERECTOMY    . BREAST EXCISIONAL BIOPSY     Left; cyst  . CATARACT EXTRACTION Right    12/2017  . CATARACT EXTRACTION W/ INTRAOCULAR LENS IMPLANT Left 09/07/2013  . CHOLECYSTECTOMY    . COLONOSCOPY    . COLONOSCOPY N/A 07/20/2015   Procedure: COLONOSCOPY;  Surgeon: Rogene Houston, MD;  [facility];  Service: Endoscopy;  Laterality: N/A;  930  . EYE SURGERY Left 09/07/2013   cataract    There were no vitals filed for this visit.   Subjective Assessment - 11/15/20 1420    Subjective States she does some of her exercise sbut doesn't do much. STates her ids want her to get home health services.States she spends most of the day laying in her bed.    Patient Stated Goals to walk better    Currently in Pain? Yes    Pain Score 8     Pain Location Back    Pain Orientation Lower    Pain Descriptors / Indicators Aching              [facility] PT Assessment - 11/15/20 0001  Assessment   Medical Diagnosis Difficulty walking    Referring Provider (PT) Pilar Jarvis    Onset Date/Surgical Date 05/27/20                         [facility] Adult PT Treatment/Exercise - 11/15/20 0001      Ambulation/Gait   Ambulation/Gait Yes    Ambulation Distance (Feet) 50 Feet    Assistive device Rolling walker    Gait Pattern Decreased stride length;Shuffle    Gait Comments x2      Knee/Hip Exercises: Standing   Other Standing Knee Exercises standing balance - wide base of support - x6 for as long as possible      Knee/Hip Exercises: Seated   Long Arc Quad Both;15 reps   1# weights  - cues to sit up tall   Marching Both;15 reps    Marching Weights 1 lbs.    Sit to Sand 2 sets;5 reps;without UE support      Knee/Hip Exercises: Supine   Quad Sets --   motivation throughout .                 PT Education - 11/15/20 1419    Education Details on difference between Home  health and outpatient services, current symtpoms on possible causes of difficulty breathing. on importance of moving during the day.    Person(s) Educated Patient    Methods Explanation    Comprehension Verbalized understanding            PT Short Term Goals - 10/27/20 1436      PT SHORT TERM GOAL #1   Title Patient will independently verbalize and demo initial HEP in order to improve B LE strength with ambulation and transfers.     Time 2    Status Achieved    Target Date 10/13/20      PT SHORT TERM GOAL #2   Title PT will be able to complete 5 sit to stand in a 30 second period using UE assist to demonstrate improved power.    Baseline 2/3: 3 in 30 seconds    Time 2    Period Weeks    Status Not Met      PT SHORT TERM GOAL #3   Title PT  will be able to ambulate 185f with a RW in 2 minute time period to demonstrate reduce risk of falling.    Time 2    Period Weeks    Status Achieved      PT SHORT TERM GOAL #4   Status On-going             PT Long Term Goals - 10/27/20 1407      PT LONG TERM GOAL #1   Title PT to be able to come sit to stand without the use of UE to demonstrate improved balance and strength.    Time 4    Period Weeks    Status On-going      PT LONG TERM GOAL #2   Title PT to be able to single leg stance on both LE for 5 seconds to decrease risk of falling    Time 4    Period Weeks    Status On-going      PT LONG TERM GOAL #3   Title PT to be able to walk 2060fwith a rolling walker in a 2 minute timeframe to allow pt to be able to make it to the restroom  in time without having an accident    Time 4    Status On-going      PT LONG TERM GOAL #4   Status On-going      PT LONG TERM GOAL #5   Status On-going                 Plan - 11/15/20 1437    Clinical Impression Statement Continued difficulty with functional activity secondary to patient fear of falling and getting in her head about her weakness and balance issues. Reassured  patient and motivated patient throughout session. Educated patient and husband on differences with home health versus out patient services but difficult for them to grasp the provided information. Will continue with current POC as tolerated will continue with lots of motivation during session. Instructed patietn on standing up every 1/2 hour during the day.    Personal Factors and Comorbidities Fitness;Past/Current Experience;Comorbidity 3+    Comorbidities HTN, seizure like activity, DM, memory issues    Examination-Activity Limitations Caring for Others;Carry;Dressing;Lift;Locomotion Level;Stairs;Stand    Examination-Participation Restrictions Cleaning;Laundry;Meal Prep;Shop    Stability/Clinical Decision Making Evolving/Moderate complexity    Rehab Potential Good    PT Frequency 2x / week    PT Duration 6 weeks   was 3x a week for 4 weeks now requesting 2x a week for 6 weeks   PT Treatment/Interventions Gait training;Stair training;Functional mobility training;Therapeutic activities;Therapeutic exercise;Balance training;Neuromuscular re-education;Patient/family education    PT Next Visit Plan Continue to work on strength, ROM and balance, f/u with home health education    PT Home Exercise Plan 1/10: AP and lateral weight shifts; 1/14: LAQ, sit to stand at kitchen table, bridge, dead bug and sidelying abduction; STS, hamstring stretch with quad set; 1/27: cervical and thoracic excursions.2/3: side stepping, marching and squat           Patient will benefit from skilled therapeutic intervention in order to improve the following deficits and impairments:  Abnormal gait,Decreased activity tolerance,Decreased balance,Decreased endurance,Decreased mobility,Decreased range of motion,Difficulty walking,Decreased strength,Pain  Visit Diagnosis: Muscle weakness (generalized)  Difficulty in walking, not elsewhere classified  Other abnormalities of gait and mobility     Problem List Patient  Active Problem List   Diagnosis Date Noted  . Dysuria 10/18/2020  . Dementia ([place]) 07/13/2020  . Cognitive deficits   . Infection   . Carotid bruit 05/26/2020  . Hyperlipidemia associated with type 2 diabetes mellitus ([place]) 12/08/2019  . Iron deficiency 01/21/2017  . Vitamin D deficiency 10/15/2016  . Urinary incontinence 10/15/2016  . Limited mobility 05/27/2016  . Type 2 diabetes mellitus with hyperglycemia ([place]) 02/04/2016  . Sleep terror disorder 12/14/2015  . Fecal incontinence 12/13/2015  . Back pain of lumbar region with sciatica 04/15/2015  . Spondylosis, cervical, with myelopathy 04/14/2015  . OSA (obstructive sleep apnea) 04/10/2013  . Peripheral neuropathy 03/12/2013  . Hearing loss 11/06/2012  . Seizure-like activity ([place]) 01/25/2012  . UNSTEADY GAIT 11/07/2010  . Alpha thalassemia ([place]) 01/26/2010  . Morbid obesity ([place]) 04/21/2008  . Anxiety and depression 04/21/2008  . Essential hypertension 04/21/2008  . GERD 04/21/2008  . Osteoarthritis of left knee 04/21/2008  . URINARY INCONTINENCE 04/21/2008    .2:44 PM, 11/15/20 Jerene Pitch, DPT Physical Therapy with [facility]  [phone] office   [place] [address] [place], [place], [address] Phone: [phone]   Fax:  [phone]  Name: Laura Atkins MRN: [id number] Date of Birth: [birth year]

## 2020-11-16 ENCOUNTER — Ambulatory Visit: Payer: Medicare HMO | Admitting: Podiatry

## 2020-11-16 NOTE — Telephone Encounter (Signed)
No answer

## 2020-11-17 ENCOUNTER — Ambulatory Visit: Payer: Medicare HMO | Admitting: Endocrinology

## 2020-11-17 ENCOUNTER — Other Ambulatory Visit: Payer: Self-pay

## 2020-11-17 VITALS — BP 120/62 | HR 76 | Ht 68.0 in | Wt 254.4 lb

## 2020-11-17 DIAGNOSIS — Z794 Long term (current) use of insulin: Secondary | ICD-10-CM

## 2020-11-17 DIAGNOSIS — E1165 Type 2 diabetes mellitus with hyperglycemia: Secondary | ICD-10-CM | POA: Diagnosis not present

## 2020-11-17 LAB — POCT GLYCOSYLATED HEMOGLOBIN (HGB A1C): Hemoglobin A1C: 9.5 % — AB (ref 4.0–5.6)

## 2020-11-17 MED ORDER — NOVOLIN 70/30 RELION (70-30) 100 UNIT/ML ~~LOC~~ SUSP: SUBCUTANEOUS | 11 refills | Status: DC

## 2020-11-17 NOTE — Progress Notes (Signed)
Subjective:    Patient ID: Laura Atkins, female    DOB: 05/20/1941, 80 y.o.   MRN: [id number]  HPI Pt returns for f/u of diabetes mellitus: DM type: insulin-requiring type 2.   Dx'ed: 5697 Complications: polyneuropathy, PAD, and foot ulcer.   Therapy: insulin since 2005.   GDM: never.  DKA: never Severe hypoglycemia: last episode was 2020.    Pancreatitis: never.   SDOH: She takes human insulin, due to cost; pt takes her own insulin, but dtr draws up for her.   Other: in early 2016, she changed to BID premixed insulin, due to poor results with multiple daily injections; she uses FL continuous glucose monitor.  Interval history: pt says she never misses the insulin. I reviewed continuous glucose monitor data.  Glucose varies from 170-400.  It is in general lowest fasting.  No recent steroids.  Pt is uncertain how much insulin she takes, but she believes it to be 25 units with breakfast, and 10 units with supper.   Past Medical History:  Diagnosis Date  . Alpha thalassemia trait 01/26/2010   02/2012: Nl CBC ex H&H-10.7/34.8, MCV-69   . Anemia   . Anxiety   . Anxiety and depression   . Cellulitis 05/09/2017  . Depression   . Diabetes mellitus   . Foot pain, right 04/30/2013  . GERD (gastroesophageal reflux disease)   . Headache(784.0)   . Hyperlipidemia   . Hypertension   . Iron deficiency 01/21/2017  . Microcytic anemia 01/26/2010   02/2012: Nl CBC ex H&H-10.7/34.8, MCV-69   . NECK PAIN, CHRONIC 10/21/2008   +chronic back pain    . Obesity   . Obstructive sleep apnea   . Osteoarthritis    Left knee; right shoulder; chronic neck and back pain  . Pruritus   . PVD (peripheral vascular disease) ([place]) 01/28/2014  . Seizures ([place])   . Shoulder pain, right 04/14/2015  . Tremor    This started months ago after her seizure progressing to very poor hand writing  . Urinary incontinence   . UTI (urinary tract infection) 01/18/2013    Past Surgical History:  Procedure Laterality Date  .  ABDOMINAL HYSTERECTOMY    . BREAST EXCISIONAL BIOPSY     Left; cyst  . CATARACT EXTRACTION Right    12/2017  . CATARACT EXTRACTION W/ INTRAOCULAR LENS IMPLANT Left 09/07/2013  . CHOLECYSTECTOMY    . COLONOSCOPY    . COLONOSCOPY N/A 07/20/2015   Procedure: COLONOSCOPY;  Surgeon: Rogene Houston, MD;  [facility];  Service: Endoscopy;  Laterality: N/A;  930  . EYE SURGERY Left 09/07/2013   cataract    Social History   Socioeconomic History  . Marital status: Married    Spouse name: Laura Atkins  . Number of children: 5  . Years of education: 60  . Highest education level: Not on file  Occupational History  . Occupation: Disabled     Employer: RETIRED  Tobacco Use  . Smoking status: Never Smoker  . Smokeless tobacco: Never Used  Vaping Use  . Vaping Use: Never used  Substance and Sexual Activity  . Alcohol use: No    Alcohol/week: 0.0 standard drinks  . Drug use: No  . Sexual activity: Yes    Birth control/protection: Surgical  Other Topics Concern  . Not on file  Social History Narrative   07/27/20 Patient lives at home with her husband Laura Atkins) and dgtrIvin Atkins.  Patient is retired.    Right handed.  Five Children.    Caffeine- 2 daily   Social Determinants of Health   Financial Resource Strain: Low Risk   . Difficulty of Paying Living Expenses: Not very hard  Food Insecurity: No Food Insecurity  . Worried About Charity fundraiser in the Last Year: Never true  . Ran Out of Food in the Last Year: Never true  Transportation Needs: No Transportation Needs  . Lack of Transportation (Medical): No  . Lack of Transportation (Non-Medical): No  Physical Activity: Inactive  . Days of Exercise per Week: 0 days  . Minutes of Exercise per Session: 0 min  Stress: No Stress Concern Present  . Feeling of Stress : Only a little  Social Connections: Moderately Integrated  . Frequency of Communication with Friends and Family: Three times a week  . Frequency of  Social Gatherings with Friends and Family: Three times a week  . Attends Religious Services: More than 4 times per year  . Active Member of Clubs or Organizations: No  . Attends Archivist Meetings: Never  . Marital Status: Married  Human resources officer Violence: Not on file    Current Outpatient Medications on File Prior to Visit  Medication Sig Dispense Refill  . Accu-Chek FastClix Lancets MISC 1 each by Does not apply route 4 (four) times daily. Use to monitor glucose levels 4 times daily; E11.65 306 each 2  . Alcohol Swabs (B-D SINGLE USE SWABS REGULAR) PADS 1 each by Does not apply route 4 (four) times daily. Use to prep site for glucose monitoring 4 times daily; E11.65 300 each 2  . amLODipine (NORVASC) 5 MG tablet TAKE 1 TABLET BY MOUTH EVERY DAY 30 tablet 3  . aspirin 81 MG tablet Take 81 mg by mouth daily.    Marland Kitchen atorvastatin (LIPITOR) 40 MG tablet TAKE 1 TABLET BY MOUTH EVERY DAY 30 tablet 2  . benazepril (LOTENSIN) 20 MG tablet     . Blood Glucose Monitoring Suppl (ACCU-CHEK GUIDE ME) w/Device KIT 1 each by Does not apply route 4 (four) times daily. Use to monitor glucose levels 4 times daily; E11.65 1 kit 0  . Cholecalciferol 125 MCG (5000 UT) TABS Take 1 tablet by mouth daily.    . ciprofloxacin (CIPRO) 500 MG tablet Take 1 tablet (500 mg total) by mouth 2 (two) times daily. 6 tablet 0  . Continuous Blood Gluc Sensor (FREESTYLE LIBRE 14 DAY SENSOR) MISC 1 Device by Other route every 14 (fourteen) days. 6 each 3  . diphenhydrAMINE (BENADRYL) 25 MG tablet Take 25 mg by mouth at bedtime.    . donepezil (ARICEPT) 5 MG tablet TAKE 1 TABLET BY MOUTH AT BEDTIME 30 tablet 2  . ferrous sulfate 325 (65 FE) MG tablet Take 1 tablet (325 mg total) by mouth daily with breakfast. 30 tablet 5  . gabapentin (NEURONTIN) 400 MG capsule TAKE ONE CAPSULE BY MOUTH EVERY MORNING and TAKE ONE CAPSULE AT NOON and TAKE TWO CAPSULES AT BEDTIME 360 capsule 1  . glucose blood (ACCU-CHEK GUIDE) test strip  1 each by Other route 4 (four) times daily. 200 strip 12  . Insulin Syringe-Needle U-100 (INSULIN SYRINGE 1CC/31GX5/16") 31G X 5/16" 1 ML MISC 1 each by Does not apply route daily. Use to inject insulin daily; E11.65 90 each 2  . Multiple Vitamin (MULTIVITAMIN) capsule Take 1 capsule by mouth daily.    Marland Kitchen omeprazole (PRILOSEC) 20 MG capsule TAKE ONE CAPSULE BY MOUTH EVERY DAY 30 capsule 2  . potassium chloride  SA (KLOR-CON) 20 MEQ tablet TAKE 1 TABLET BY MOUTH TWICE DAILY 60 tablet 1  . sertraline (ZOLOFT) 100 MG tablet Take 1 tablet (100 mg total) by mouth daily. 60 tablet 5  . UNABLE TO FIND Walker x 1  DX unsteady gait, osteoarthritis of left knee 1 each 0  . UNABLE TO FIND Elevated [place] Seat x 1 DX: unsteady gait, back pain, osteoarthritis of left knee 1 each 0  . UNABLE TO FIND Standing upright walker x 1  DX M54.40, M19.90 1 each 0  . UNABLE TO FIND Incontinence pads and supplies 1 each 1   No current facility-administered medications on file prior to visit.    Allergies  Allergen Reactions  . Penicillins Shortness Of Breath, Itching and Rash  . Prednisone Shortness Of Breath, Itching and Rash  . Propoxyphene N-Acetaminophen Itching and Nausea And Vomiting  . Sulfa Antibiotics Itching and Nausea And Vomiting    Family History  Problem Relation Age of Onset  . Lung cancer Mother 67  . Kidney disease Father   . Diabetes Sister   . Keloids Brother   . ADD / ADHD Grandchild   . Bipolar disorder Grandchild   . Bipolar disorder Daughter   . Seizures Daughter   . Heart disease Daughter   . Kidney disease Son   . Neuropathy Son   . Kidney disease Son   . Edema Daughter   . Breast cancer Daughter 12  . Allergies Daughter   . Alcohol abuse Neg Hx   . Drug abuse Neg Hx     BP (!) 120/50 (BP Location: Right Arm, Patient Position: Sitting, Cuff Size: Large)   Pulse 76   Ht 5' 8"  (1.727 m)   Wt 254 lb 6.4 oz (115.4 kg)   SpO2 97%   BMI 38.68 kg/m    Review of  Systems She denies hypoglycemia    Objective:   Physical Exam VITAL SIGNS:  See vs page GENERAL: no distress Pulses: dorsalis pedis absent bilat (prob due to edema).   MSK: no deformity of the feet CV: 2+ bilat bilat leg edema Skin:  no ulcer on the feet, but the skin at the right heel is abraded.  normal temp on the feet.  There is hyperpigmentation of the legs and feet.   Neuro: sensation is intact to touch on the feet, but decreased from normal.   Ext: there is bilateral onychomycosis of the toenails.    Lab Results  Component Value Date   HGBA1C 9.5 (A) 11/17/2020       Assessment & Plan:  HTN: is noted today.  Insulin-requiring type 2 DM, with PAD: uncontrolled.   Patient Instructions  Your blood pressure is high today.  Please see your primary care provider soon, to have it rechecked check your blood sugar twice a day.  vary the time of day when you check, between before the 3 meals, and at bedtime.  also check if you have symptoms of your blood sugar being too high or too low.  please keep a record of the readings and bring it to your next appointment here (or you can bring the meter itself).  You can write it on any piece of paper.  please call us sooner if your blood sugar goes below 70, or if you have a lot of readings over 200.   Please increase the insulin to 35 units with breakfast, and 15 units with supper.   On this type of insulin schedule, you  should eat meals on a regular schedule.  If a meal is missed or significantly delayed, your blood sugar could go low.   Please come back for a follow-up appointment in 1 month.

## 2020-11-17 NOTE — Patient Instructions (Addendum)
Your blood pressure is high today.  Please see your primary care provider soon, to have it rechecked check your blood sugar twice a day.  vary the time of day when you check, between before the 3 meals, and at bedtime.  also check if you have symptoms of your blood sugar being too high or too low.  please keep a record of the readings and bring it to your next appointment here (or you can bring the meter itself).  You can write it on any piece of paper.  please call us sooner if your blood sugar goes below 70, or if you have a lot of readings over 200.   Please increase the insulin to 35 units with breakfast, and 15 units with supper.   On this type of insulin schedule, you should eat meals on a regular schedule.  If a meal is missed or significantly delayed, your blood sugar could go low.   Please come back for a follow-up appointment in 1 month.

## 2020-11-18 ENCOUNTER — Ambulatory Visit (INDEPENDENT_AMBULATORY_CARE_PROVIDER_SITE_OTHER): Payer: Medicare HMO | Admitting: Cardiology

## 2020-11-18 ENCOUNTER — Ambulatory Visit (HOSPITAL_COMMUNITY): Payer: Medicare HMO | Admitting: Physical Therapy

## 2020-11-18 ENCOUNTER — Encounter: Payer: Self-pay | Admitting: Cardiology

## 2020-11-18 ENCOUNTER — Encounter (HOSPITAL_COMMUNITY): Payer: Self-pay | Admitting: Physical Therapy

## 2020-11-18 ENCOUNTER — Encounter (HOSPITAL_COMMUNITY): Payer: Self-pay

## 2020-11-18 VITALS — BP 122/66 | HR 81 | Ht 68.0 in | Wt 254.0 lb

## 2020-11-18 DIAGNOSIS — R600 Localized edema: Secondary | ICD-10-CM | POA: Diagnosis not present

## 2020-11-18 DIAGNOSIS — M6281 Muscle weakness (generalized): Secondary | ICD-10-CM

## 2020-11-18 DIAGNOSIS — R262 Difficulty in walking, not elsewhere classified: Secondary | ICD-10-CM

## 2020-11-18 DIAGNOSIS — I10 Essential (primary) hypertension: Secondary | ICD-10-CM

## 2020-11-18 DIAGNOSIS — R2689 Other abnormalities of gait and mobility: Secondary | ICD-10-CM

## 2020-11-18 MED ORDER — TORSEMIDE 20 MG PO TABS: ORAL_TABLET | ORAL | 6 refills | Status: DC

## 2020-11-18 NOTE — Patient Instructions (Signed)
Your physician wants you to follow-up in: [place] has recommended you make the following change in your medication:   TAKE TORSEMIDE 40 MG EVERY OTHER DAY ALTERNATING WITH 60 MG EVERY OTHER DAY   Your physician recommends that you return for lab work in: 2 WEEKS BMP/MG  Thank you for choosing [facility]!!

## 2020-11-18 NOTE — Therapy (Signed)
[place], [place], [address] Phone: [phone]   Fax:  [phone]  Patient Details  Name: Laura Atkins MRN: [id number] Date of Birth: [birth year] Referring Provider:  Fayrene Helper, MD  Encounter Date: 11/18/2020   Erroneous encounter - patient arrived to be seen but had incontinent episode and asked to reschedule appointment.  No visit charges.   2:56 PM, 11/18/20 Jerene Pitch, DPT Physical Therapy with [facility]  [phone] office  [place] [address] [place], [place], [address] Phone: [phone]   Fax:  [phone]

## 2020-11-18 NOTE — Progress Notes (Signed)
Clinical Summary Ms. Lye is a 80 y.o.female seen today for follow up of the following medical problems.   1. Leg swelling  Some increased LE edema since last visit. Compliant with her torsemide 21m daily.    2. Leg pain - cramping pain in back of legs with exertion. No rest pain, no sores on feet - ABIs 12/2014 were normal   3. HTN - she is compliant with meds  4. HL - compliant with statin.  Last panel Jan 2018 TC 162 TG 90 HDL 68 LDL 76  05/2020 TC 108 TG 74 HDL 49 LDL 44  5. Alpha thalasemia - followed by hematology  6. CKD III   Past Medical History:  Diagnosis Date  . Alpha thalassemia trait 01/26/2010   02/2012: Nl CBC ex H&H-10.7/34.8, MCV-69   . Anemia   . Anxiety   . Anxiety and depression   . Cellulitis 05/09/2017  . Depression   . Diabetes mellitus   . Foot pain, right 04/30/2013  . GERD (gastroesophageal reflux disease)   . Headache(784.0)   . Hyperlipidemia   . Hypertension   . Iron deficiency 01/21/2017  . Microcytic anemia 01/26/2010   02/2012: Nl CBC ex H&H-10.7/34.8, MCV-69   . NECK PAIN, CHRONIC 10/21/2008   +chronic back pain    . Obesity   . Obstructive sleep apnea   . Osteoarthritis    Left knee; right shoulder; chronic neck and back pain  . Pruritus   . PVD ([place]ipheral vascular disease) ([place] 01/28/2014  . Seizures ([place]   . Shoulder pain, right 04/14/2015  . Tremor    This started months ago after her seizure progressing to very poor hand writing  . Urinary incontinence   . UTI (urinary tract infection) 01/18/2013     Allergies  Allergen Reactions  . Penicillins Shortness Of Breath, Itching and Rash  . Prednisone Shortness Of Breath, Itching and Rash  . Propoxyphene N-Acetaminophen Itching and Nausea And Vomiting  . Sulfa Antibiotics Itching and Nausea And Vomiting     Current Outpatient Medications  Medication Sig Dispense Refill  . Accu-Chek FastClix Lancets MISC 1 each by Does not apply route 4 (four) times daily.  Use to monitor glucose levels 4 times daily; E11.65 306 each 2  . Alcohol Swabs (B-D SINGLE USE SWABS REGULAR) PADS 1 each by Does not apply route 4 (four) times daily. Use to prep site for glucose monitoring 4 times daily; E11.65 300 each 2  . amLODipine (NORVASC) 5 MG tablet TAKE 1 TABLET BY MOUTH EVERY DAY 30 tablet 3  . aspirin 81 MG tablet Take 81 mg by mouth daily.    .Marland Kitchenatorvastatin (LIPITOR) 40 MG tablet TAKE 1 TABLET BY MOUTH EVERY DAY 30 tablet 2  . benazepril (LOTENSIN) 20 MG tablet     . Blood Glucose Monitoring Suppl (ACCU-CHEK GUIDE ME) w/Device KIT 1 each by Does not apply route 4 (four) times daily. Use to monitor glucose levels 4 times daily; E11.65 1 kit 0  . Cholecalciferol 125 MCG (5000 UT) TABS Take 1 tablet by mouth daily.    . ciprofloxacin (CIPRO) 500 MG tablet Take 1 tablet (500 mg total) by mouth 2 (two) times daily. 6 tablet 0  . Continuous Blood Gluc Sensor (FREESTYLE LIBRE 14 DAY SENSOR) MISC 1 Device by Other route every 14 (fourteen) days. 6 each 3  . diphenhydrAMINE (BENADRYL) 25 MG tablet Take 25 mg by mouth at bedtime.    . donepezil (ARICEPT)  5 MG tablet TAKE 1 TABLET BY MOUTH AT BEDTIME 30 tablet 2  . ferrous sulfate 325 (65 FE) MG tablet Take 1 tablet (325 mg total) by mouth daily with breakfast. 30 tablet 5  . gabapentin (NEURONTIN) 400 MG capsule TAKE ONE CAPSULE BY MOUTH EVERY MORNING and TAKE ONE CAPSULE AT NOON and TAKE TWO CAPSULES AT BEDTIME 360 capsule 1  . glucose blood (ACCU-CHEK GUIDE) test strip 1 each by Other route 4 (four) times daily. 200 strip 12  . insulin NPH-regular Human (NOVOLIN 70/30 RELION) (70-30) 100 UNIT/ML injection 35 units with breakfast, and 15 units with supper. 20 mL 11  . Insulin Syringe-Needle U-100 (INSULIN SYRINGE 1CC/31GX5/16") 31G X 5/16" 1 ML MISC 1 each by Does not apply route daily. Use to inject insulin daily; E11.65 90 each 2  . Multiple Vitamin (MULTIVITAMIN) capsule Take 1 capsule by mouth daily.    Marland Kitchen omeprazole  (PRILOSEC) 20 MG capsule TAKE ONE CAPSULE BY MOUTH EVERY DAY 30 capsule 2  . potassium chloride SA (KLOR-CON) 20 MEQ tablet TAKE 1 TABLET BY MOUTH TWICE DAILY 60 tablet 1  . sertraline (ZOLOFT) 100 MG tablet Take 1 tablet (100 mg total) by mouth daily. 60 tablet 5  . torsemide (DEMADEX) 20 MG tablet Take 2 tablets (40 mg total) by mouth daily. 60 tablet 6  . UNABLE TO FIND Walker x 1  DX unsteady gait, osteoarthritis of left knee 1 each 0  . UNABLE TO FIND Elevated [place] Seat x 1 DX: unsteady gait, back pain, osteoarthritis of left knee 1 each 0  . UNABLE TO FIND Standing upright walker x 1  DX M54.40, M19.90 1 each 0  . UNABLE TO FIND Incontinence pads and supplies 1 each 1   No current facility-administered medications for this visit.     Past Surgical History:  Procedure Laterality Date  . ABDOMINAL HYSTERECTOMY    . BREAST EXCISIONAL BIOPSY     Left; cyst  . CATARACT EXTRACTION Right    12/2017  . CATARACT EXTRACTION W/ INTRAOCULAR LENS IMPLANT Left 09/07/2013  . CHOLECYSTECTOMY    . COLONOSCOPY    . COLONOSCOPY N/A 07/20/2015   Procedure: COLONOSCOPY;  Surgeon: Rogene Houston, MD;  [facility];  Service: Endoscopy;  Laterality: N/A;  930  . EYE SURGERY Left 09/07/2013   cataract     Allergies  Allergen Reactions  . Penicillins Shortness Of Breath, Itching and Rash  . Prednisone Shortness Of Breath, Itching and Rash  . Propoxyphene N-Acetaminophen Itching and Nausea And Vomiting  . Sulfa Antibiotics Itching and Nausea And Vomiting      Family History  Problem Relation Age of Onset  . Lung cancer Mother 9  . Kidney disease Father   . Diabetes Sister   . Keloids Brother   . ADD / ADHD Grandchild   . Bipolar disorder Grandchild   . Bipolar disorder Daughter   . Seizures Daughter   . Heart disease Daughter   . Kidney disease Son   . Neuropathy Son   . Kidney disease Son   . Edema Daughter   . Breast cancer Daughter 18  . Allergies Daughter   .  Alcohol abuse Neg Hx   . Drug abuse Neg Hx      Social History Ms. Gervais reports that she has never smoked. She has never used smokeless tobacco. Ms. Karg reports no history of alcohol use.   Review of Systems CONSTITUTIONAL: No weight loss, fever, chills, weakness or  fatigue.  HEENT: Eyes: No visual loss, blurred vision, double vision or yellow sclerae.No hearing loss, sneezing, congestion, runny nose or sore throat.  SKIN: No rash or itching.  CARDIOVASCULAR: per hpi RESPIRATORY: No shortness of breath, cough or sputum.  GASTROINTESTINAL: No anorexia, nausea, vomiting or diarrhea. No abdominal pain or blood.  GENITOURINARY: No burning on urination, no polyuria NEUROLOGICAL: No headache, dizziness, syncope, paralysis, ataxia, numbness or tingling in the extremities. No change in bowel or bladder control.  MUSCULOSKELETAL: No muscle, back pain, joint pain or stiffness.  LYMPHATICS: No enlarged nodes. No history of splenectomy.  PSYCHIATRIC: No history of depression or anxiety.  ENDOCRINOLOGIC: No reports of sweating, cold or heat intolerance. No polyuria or polydipsia.  Marland Kitchen   Physical Examination Today's Vitals   11/18/20 1152  BP: 122/66  Pulse: 81  SpO2: 99%  Weight: 254 lb (115.2 kg)  Height: 5' 8"  (1.727 m)   Body mass index is 38.62 kg/m.  Gen: resting comfortably, no acute distress HEENT: no scleral icterus, pupils equal round and reactive, no palptable cervical adenopathy,  CV: RRR, no mr/g, no jvd Resp: Clear to auscultation bilaterally GI: abdomen is soft, non-tender, non-distended, normal bowel sounds, no hepatosplenomegaly MSK: extremities are warm, 1+bilateral LE edema Skin: warm, no rash Neuro:  no focal deficits Psych: appropriate affect   Diagnostic Studies 01/2013 echo Study Conclusions  - Left ventricle: The cavity size was normal. There was mild concentric hypertrophy. Systolic function was normal. The estimated ejection fraction was in the  range of 60% to 65%. Wall motion was normal; there were no regional wall motion abnormalities. - Aortic valve: Mildly calcified annulus. - Right ventricle: The cavity size was normal. Wall thickness was mildly increased. - Atrial septum: No defect or patent foramen ovale was identified.  01/2013 MPI IMPRESSION: Probably negative pharmacologic stress nuclear myocardial study revealing normal left ventricular size, normal left ventricular systolic function and no significant stress-induced EKG abnormalities. On scintigraphic imaging, there was a small and mild defect that likely represents variable breast attenuation; however, the possibility of modest anterior ischemia cannot be unequivocally excluded.  01/2013 Event monitor No significant arrhythmias  12/2014 ABIs: R 1.10 L 1.15  02/2015 Carotid US 1-39% bilateral stenosis    Assessment and Plan  1. Chronic LE edema - some increase in swelling, will increase torsemide to 24m alternating days with 623m check BMET/Mg in 2 weeks   2. HTN - at goal, continue current meds  3. Hyperlipidemia - request pcp labs, continue statin    JoArnoldo LenisM.D.

## 2020-11-22 ENCOUNTER — Other Ambulatory Visit: Payer: Self-pay

## 2020-11-22 ENCOUNTER — Ambulatory Visit (HOSPITAL_COMMUNITY): Payer: Medicare Other | Attending: Nurse Practitioner

## 2020-11-22 ENCOUNTER — Other Ambulatory Visit: Payer: Self-pay | Admitting: Family Medicine

## 2020-11-22 ENCOUNTER — Encounter (HOSPITAL_COMMUNITY): Payer: Self-pay

## 2020-11-22 DIAGNOSIS — M6281 Muscle weakness (generalized): Secondary | ICD-10-CM | POA: Diagnosis present

## 2020-11-22 DIAGNOSIS — R262 Difficulty in walking, not elsewhere classified: Secondary | ICD-10-CM | POA: Diagnosis present

## 2020-11-22 DIAGNOSIS — R2689 Other abnormalities of gait and mobility: Secondary | ICD-10-CM | POA: Diagnosis present

## 2020-11-22 NOTE — Therapy (Signed)
[place], [place], [address] Phone: [phone]   Fax:  [phone]  Physical Therapy Treatment  Patient Details  Name: Laura Atkins MRN: [id number] Date of Birth: 1941-05-24 Referring Provider (PT): Pilar Jarvis   Encounter Date: 11/22/2020   PT End of Session - 11/22/20 1413    Visit Number 14    Number of Visits 22    Date for PT Re-Evaluation 12/13/20    Authorization Type [employer] approved 12 visits from 09/29/20 to 10/27/20; 12 visits approved from 11/01/20 to 12/08/20    Authorization Time Period --    Authorization - Visit Number 4    Authorization - Number of Visits 12    Progress Note Due on Visit 20    PT Start Time 1657    PT Stop Time 1450    PT Time Calculation (min) 47 min    Equipment Utilized During Treatment Gait belt    Activity Tolerance Patient limited by fatigue;Patient tolerated treatment well;Patient limited by pain;No increased pain   Bil knee pain with weight bearing   Behavior During Therapy WFL for tasks assessed/performed           Past Medical History:  Diagnosis Date  . Alpha thalassemia trait 01/26/2010   02/2012: Nl CBC ex H&H-10.7/34.8, MCV-69   . Anemia   . Anxiety   . Anxiety and depression   . Cellulitis 05/09/2017  . Depression   . Diabetes mellitus   . Foot pain, right 04/30/2013  . GERD (gastroesophageal reflux disease)   . Headache(784.0)   . Hyperlipidemia   . Hypertension   . Iron deficiency 01/21/2017  . Microcytic anemia 01/26/2010   02/2012: Nl CBC ex H&H-10.7/34.8, MCV-69   . NECK PAIN, CHRONIC 10/21/2008   +chronic back pain    . Obesity   . Obstructive sleep apnea   . Osteoarthritis    Left knee; right shoulder; chronic neck and back pain  . Pruritus   . PVD (peripheral vascular disease) ([place]) 01/28/2014  . Seizures ([place])   . Shoulder pain, right 04/14/2015  . Tremor    This started months ago after her seizure progressing to very poor hand writing  . Urinary  incontinence   . UTI (urinary tract infection) 01/18/2013    Past Surgical History:  Procedure Laterality Date  . ABDOMINAL HYSTERECTOMY    . BREAST EXCISIONAL BIOPSY     Left; cyst  . CATARACT EXTRACTION Right    12/2017  . CATARACT EXTRACTION W/ INTRAOCULAR LENS IMPLANT Left 09/07/2013  . CHOLECYSTECTOMY    . COLONOSCOPY    . COLONOSCOPY N/A 07/20/2015   Procedure: COLONOSCOPY;  Surgeon: Rogene Houston, MD;  [facility];  Service: Endoscopy;  Laterality: N/A;  930  . EYE SURGERY Left 09/07/2013   cataract    There were no vitals filed for this visit.   Subjective Assessment - 11/22/20 1411    Subjective Pt stated her knees are bothering her today, pain scale 6-7/10.    Patient is accompained by: Family member   husband   Pertinent History HTN, DM, AKI seizure like activity.    Patient Stated Goals to walk better    Currently in Pain? Yes    Pain Score 7     Pain Location Knee    Pain Orientation Right;Left    Pain Descriptors / Indicators Aching    Pain Type Chronic pain    Pain Onset More than a month  ago    Pain Frequency Intermittent    Aggravating Factors  weight bearing    Pain Relieving Factors meds    Effect of Pain on Daily Activities limits                             [facility] Adult PT Treatment/Exercise - 11/22/20 0001      Ambulation/Gait   Ambulation/Gait Yes    Ambulation Distance (Feet) 50 Feet    Assistive device Rolling walker    Gait Pattern Decreased stride length;Shuffle    Gait Comments x2      Exercises   Exercises Knee/Hip      Knee/Hip Exercises: Standing   Terminal Knee Extension 2 sets;5 reps    Terminal Knee Extension Limitations 5" holds pushing back of knee against mat    Functional Squat 2 sets;5 reps    Functional Squat Limitations minisquat with UE support    Other Standing Knee Exercises standing balance - wide base of support - x6 for as long as possible      Knee/Hip Exercises: Seated   Long Arc  Quad Both;15 reps    Long Arc Quad Limitations Cueing for sitting tall    Sit to [employer] 2 sets;5 reps;without UE support   23in height, TKE exercise following standing     Knee/Hip Exercises: Supine   Bridges 2 sets;5 reps    Bridges Limitations 3" holds      Knee/Hip Exercises: Prone   Hip Extension Both;2 sets;5 reps    Hip Extension Limitations 3" holds    Other Prone Exercises TKE 10x 3"                    PT Short Term Goals - 10/27/20 1436      PT SHORT TERM GOAL #1   Title Patient will independently verbalize and demo initial HEP in order to improve B LE strength with ambulation and transfers.     Time 2    Status Achieved    Target Date 10/13/20      PT SHORT TERM GOAL #2   Title PT will be able to complete 5 sit to stand in a 30 second period using UE assist to demonstrate improved power.    Baseline 2/3: 3 in 30 seconds    Time 2    Period Weeks    Status Not Met      PT SHORT TERM GOAL #3   Title PT  will be able to ambulate 180f with a RW in 2 minute time period to demonstrate reduce risk of falling.    Time 2    Period Weeks    Status Achieved      PT SHORT TERM GOAL #4   Status On-going             PT Long Term Goals - 10/27/20 1407      PT LONG TERM GOAL #1   Title PT to be able to come sit to stand without the use of UE to demonstrate improved balance and strength.    Time 4    Period Weeks    Status On-going      PT LONG TERM GOAL #2   Title PT to be able to single leg stance on both LE for 5 seconds to decrease risk of falling    Time 4    Period Weeks    Status On-going  PT LONG TERM GOAL #3   Title PT to be able to walk 218f with a rolling walker in a 2 minute timeframe to allow pt to be able to make it to the restroom in time without having an accident    Time 4    Status On-going      PT LONG TERM GOAL #4   Status On-going      PT LONG TERM GOAL #5   Status On-going                 Plan - 11/22/20 1437     Clinical Impression Statement Pt continues to demonstrate significant weakness and increased risk of falling noted by very slow cadence and shuffled gait.  Gait training to improve foot clearance.  Pt ambulated as well as standing with hip and knees flexed.  Educated on good posture assistance with balance.  Reviewed importance of HEP compliance to assist with daily functional tasks and importance of moving at home.  Therex focus on gluteal and quad strengtheing.  Added TKE activites to address extension lag with knees.    Personal Factors and Comorbidities Fitness;Past/Current Experience;Comorbidity 3+    Comorbidities HTN, seizure like activity, DM, memory issues    Examination-Activity Limitations Caring for Others;Carry;Dressing;Lift;Locomotion Level;Stairs;Stand    Examination-Participation Restrictions Cleaning;Laundry;Meal Prep;Shop    Stability/Clinical Decision Making Evolving/Moderate complexity    Clinical Decision Making Moderate    Rehab Potential Good    PT Frequency 2x / week    PT Duration 6 weeks   was 3x a week for 4 weeks now requesting 2x a week for 6 weeks   PT Treatment/Interventions Gait training;Stair training;Functional mobility training;Therapeutic activities;Therapeutic exercise;Balance training;Neuromuscular re-education;Patient/family education    PT Next Visit Plan Continue to work on strength, ROM and balance, f/u with home health education    PT Home Exercise Plan 1/10: AP and lateral weight shifts; 1/14: LAQ, sit to stand at kitchen table, bridge, dead bug and sidelying abduction; STS, hamstring stretch with quad set; 1/27: cervical and thoracic excursions.2/3: side stepping, marching and squat           Patient will benefit from skilled therapeutic intervention in order to improve the following deficits and impairments:  Abnormal gait,Decreased activity tolerance,Decreased balance,Decreased endurance,Decreased mobility,Decreased range of motion,Difficulty  walking,Decreased strength,Pain  Visit Diagnosis: Muscle weakness (generalized)  Difficulty in walking, not elsewhere classified  Other abnormalities of gait and mobility     Problem List Patient Active Problem List   Diagnosis Date Noted  . Dysuria 10/18/2020  . Dementia ([place] 07/13/2020  . Cognitive deficits   . Infection   . Carotid bruit 05/26/2020  . Hyperlipidemia associated with type 2 diabetes mellitus ([place] 12/08/2019  . Iron deficiency 01/21/2017  . Vitamin D deficiency 10/15/2016  . Urinary incontinence 10/15/2016  . Limited mobility 05/27/2016  . Type 2 diabetes mellitus with hyperglycemia ([place] 02/04/2016  . Sleep terror disorder 12/14/2015  . Fecal incontinence 12/13/2015  . Back pain of lumbar region with sciatica 04/15/2015  . Spondylosis, cervical, with myelopathy 04/14/2015  . OSA (obstructive sleep apnea) 04/10/2013  . Peripheral neuropathy 03/12/2013  . Hearing loss 11/06/2012  . Seizure-like activity ([facility] 01/25/2012  . UNSTEADY GAIT 11/07/2010  . Alpha thalassemia ([place] 01/26/2010  . Morbid obesity ([place] 04/21/2008  . Anxiety and depression 04/21/2008  . Essential hypertension 04/21/2008  . GERD 04/21/2008  . Osteoarthritis of left knee 04/21/2008  . URINARY INCONTINENCE 04/21/2008   CIhor Austin LPTA/CLT; CDelana [phone] CNickola Major  Tessie Eke 11/22/2020, 4:26 PM  [place] [address] [place], [place], [address] Phone: [phone]   Fax:  [phone]  Name: Laura Atkins MRN: [id number] Date of Birth: 11/22/1940

## 2020-11-23 NOTE — Telephone Encounter (Signed)
LVM to call the office

## 2020-11-24 ENCOUNTER — Other Ambulatory Visit: Payer: Self-pay

## 2020-11-24 ENCOUNTER — Encounter (HOSPITAL_COMMUNITY): Payer: Self-pay

## 2020-11-24 ENCOUNTER — Ambulatory Visit (HOSPITAL_COMMUNITY): Payer: Medicare Other

## 2020-11-24 DIAGNOSIS — R2689 Other abnormalities of gait and mobility: Secondary | ICD-10-CM

## 2020-11-24 DIAGNOSIS — M6281 Muscle weakness (generalized): Secondary | ICD-10-CM

## 2020-11-24 DIAGNOSIS — R262 Difficulty in walking, not elsewhere classified: Secondary | ICD-10-CM

## 2020-11-24 NOTE — Therapy (Signed)
[place], [place], [address] Phone: [phone]   Fax:  [phone]  Physical Therapy Treatment  Patient Details  Name: Laura Atkins MRN: [id number] Date of Birth: Dec 31, 1940 Referring Provider (PT): Pilar Jarvis   Encounter Date: 11/24/2020   PT End of Session - 11/24/20 1606    Visit Number 15    Number of Visits 22    Date for PT Re-Evaluation 12/13/20    Authorization Type [employer] approved 12 visits from 09/29/20 to 10/27/20; 12 visits approved from 11/01/20 to 12/08/20    Authorization Time Period --    Authorization - Visit Number 5    Authorization - Number of Visits 12    Progress Note Due on Visit 20    PT Start Time 1537    PT Stop Time 1620    PT Time Calculation (min) 43 min    Equipment Utilized During Treatment Gait belt    Activity Tolerance Patient limited by fatigue;Patient tolerated treatment well;Patient limited by pain;No increased pain    Behavior During Therapy WFL for tasks assessed/performed           Past Medical History:  Diagnosis Date  . Alpha thalassemia trait 01/26/2010   02/2012: Nl CBC ex H&H-10.7/34.8, MCV-69   . Anemia   . Anxiety   . Anxiety and depression   . Cellulitis 05/09/2017  . Depression   . Diabetes mellitus   . Foot pain, right 04/30/2013  . GERD (gastroesophageal reflux disease)   . Headache(784.0)   . Hyperlipidemia   . Hypertension   . Iron deficiency 01/21/2017  . Microcytic anemia 01/26/2010   02/2012: Nl CBC ex H&H-10.7/34.8, MCV-69   . NECK PAIN, CHRONIC 10/21/2008   +chronic back pain    . Obesity   . Obstructive sleep apnea   . Osteoarthritis    Left knee; right shoulder; chronic neck and back pain  . Pruritus   . PVD (peripheral vascular disease) ([place]) 01/28/2014  . Seizures ([place])   . Shoulder pain, right 04/14/2015  . Tremor    This started months ago after her seizure progressing to very poor hand writing  . Urinary incontinence   . UTI (urinary tract  infection) 01/18/2013    Past Surgical History:  Procedure Laterality Date  . ABDOMINAL HYSTERECTOMY    . BREAST EXCISIONAL BIOPSY     Left; cyst  . CATARACT EXTRACTION Right    12/2017  . CATARACT EXTRACTION W/ INTRAOCULAR LENS IMPLANT Left 09/07/2013  . CHOLECYSTECTOMY    . COLONOSCOPY    . COLONOSCOPY N/A 07/20/2015   Procedure: COLONOSCOPY;  Surgeon: Rogene Houston, MD;  [facility];  Service: Endoscopy;  Laterality: N/A;  930  . EYE SURGERY Left 09/07/2013   cataract    There were no vitals filed for this visit.   Subjective Assessment - 11/24/20 1543    Subjective Pt reports her Rt knee is bothering her today on lateral side, 4/10.    Patient is accompained by: Family member   husband   Pertinent History HTN, DM, AKI seizure like activity.    Patient Stated Goals to walk better    Currently in Pain? Yes    Pain Score 4     Pain Location Knee    Pain Orientation Right;Lateral    Pain Descriptors / Indicators Aching    Pain Type Chronic pain    Pain Onset More than a month ago    Pain  Frequency Intermittent    Aggravating Factors  weight bearing    Pain Relieving Factors meds    Effect of Pain on Daily Activities limits                             [facility] Adult PT Treatment/Exercise - 11/24/20 0001      Ambulation/Gait   Ambulation/Gait Yes    Ambulation Distance (Feet) 50 Feet    Assistive device Rolling walker    Gait Pattern Decreased stride length;Shuffle    Gait Comments x2; improved cadence with verbal cueing at EOS      Knee/Hip Exercises: Standing   Functional Squat 2 sets;5 reps    Functional Squat Limitations minisquat with UE support    Other Standing Knee Exercises marching 10x each    Other Standing Knee Exercises static balance x 3 min      Knee/Hip Exercises: Seated   Sit to Sand 2 sets;5 reps;without UE support   elevated height     Knee/Hip Exercises: Supine   Short Arc Quad Sets 10 reps    Short Arc Quad Sets  Limitations 3" holds    Bridges 2 sets;10 reps    Bridges Limitations 3" holds    Knee Extension Right;Left    Knee Extension Limitations Rt 8, Lt 12    Knee Flexion AROM;Left;Both    Knee Flexion Limitations Lt 96, Rt 98      Knee/Hip Exercises: Prone   Hip Extension Both;2 sets;5 reps    Hip Extension Limitations 3" holds                    PT Short Term Goals - 10/27/20 1436      PT SHORT TERM GOAL #1   Title Patient will independently verbalize and demo initial HEP in order to improve B LE strength with ambulation and transfers.     Time 2    Status Achieved    Target Date 10/13/20      PT SHORT TERM GOAL #2   Title PT will be able to complete 5 sit to stand in a 30 second period using UE assist to demonstrate improved power.    Baseline 2/3: 3 in 30 seconds    Time 2    Period Weeks    Status Not Met      PT SHORT TERM GOAL #3   Title PT  will be able to ambulate 127f with a RW in 2 minute time period to demonstrate reduce risk of falling.    Time 2    Period Weeks    Status Achieved      PT SHORT TERM GOAL #4   Status On-going             PT Long Term Goals - 10/27/20 1407      PT LONG TERM GOAL #1   Title PT to be able to come sit to stand without the use of UE to demonstrate improved balance and strength.    Time 4    Period Weeks    Status On-going      PT LONG TERM GOAL #2   Title PT to be able to single leg stance on both LE for 5 seconds to decrease risk of falling    Time 4    Period Weeks    Status On-going      PT LONG TERM GOAL #3   Title PT to  be able to walk 235f with a rolling walker in a 2 minute timeframe to allow pt to be able to make it to the restroom in time without having an accident    Time 4    Status On-going      PT LONG TERM GOAL #4   Status On-going      PT LONG TERM GOAL #5   Status On-going                 Plan - 11/24/20 1607    Clinical Impression Statement Session focus wiht therapeutic  activities for functional strengthening to assist with activities at home.  Pt continues to require elevated height and mod-max assistance and verbal cueing to improve STS.  Pt stressed importance of completing exercises at home to increase ease with gait and functional activities at home.  Continued gluteal and quad strengthening.  Pt lacking terminal extension wiht standing/gait.  ROM taken Lt 12-96 degrees, Rt 8--98 degrees. Added SAQ to improve distal quad strengthening.  Pt limited by fatigue at EOS.    Personal Factors and Comorbidities Fitness;Past/Current Experience;Comorbidity 3+    Comorbidities HTN, seizure like activity, DM, memory issues    Examination-Activity Limitations Caring for Others;Carry;Dressing;Lift;Locomotion Level;Stairs;Stand    Examination-Participation Restrictions Cleaning;Laundry;Meal Prep;Shop    Stability/Clinical Decision Making Evolving/Moderate complexity    Clinical Decision Making Moderate    Rehab Potential Good    PT Frequency 2x / week    PT Duration 6 weeks   was 3x a week for 4 weeks now requesting 2x a week for 6 weeks   PT Treatment/Interventions Gait training;Stair training;Functional mobility training;Therapeutic activities;Therapeutic exercise;Balance training;Neuromuscular re-education;Patient/family education    PT Next Visit Plan Continue to work on strength, ROM and balance, f/u with home health education    PT Home Exercise Plan 1/10: AP and lateral weight shifts; 1/14: LAQ, sit to stand at kitchen table, bridge, dead bug and sidelying abduction; STS, hamstring stretch with quad set; 1/27: cervical and thoracic excursions.2/3: side stepping, marching and squat           Patient will benefit from skilled therapeutic intervention in order to improve the following deficits and impairments:  Abnormal gait,Decreased activity tolerance,Decreased balance,Decreased endurance,Decreased mobility,Decreased range of motion,Difficulty walking,Decreased  strength,Pain  Visit Diagnosis: Muscle weakness (generalized)  Difficulty in walking, not elsewhere classified  Other abnormalities of gait and mobility     Problem List Patient Active Problem List   Diagnosis Date Noted  . Dysuria 10/18/2020  . Dementia ([place] 07/13/2020  . Cognitive deficits   . Infection   . Carotid bruit 05/26/2020  . Hyperlipidemia associated with type 2 diabetes mellitus ([place] 12/08/2019  . Iron deficiency 01/21/2017  . Vitamin D deficiency 10/15/2016  . Urinary incontinence 10/15/2016  . Limited mobility 05/27/2016  . Type 2 diabetes mellitus with hyperglycemia ([place] 02/04/2016  . Sleep terror disorder 12/14/2015  . Fecal incontinence 12/13/2015  . Back pain of lumbar region with sciatica 04/15/2015  . Spondylosis, cervical, with myelopathy 04/14/2015  . OSA (obstructive sleep apnea) 04/10/2013  . Peripheral neuropathy 03/12/2013  . Hearing loss 11/06/2012  . Seizure-like activity ([place] 01/25/2012  . UNSTEADY GAIT 11/07/2010  . Alpha thalassemia ([place] 01/26/2010  . Morbid obesity ([place] 04/21/2008  . Anxiety and depression 04/21/2008  . Essential hypertension 04/21/2008  . GERD 04/21/2008  . Osteoarthritis of left knee 04/21/2008  . URINARY INCONTINENCE 04/21/2008   CIhor Austin LPTA/CLT; CBIS [phone] CAldona Lento3/11/2020, 4:38 PM  [facility]  [place], [place], [address] Phone: [phone]   Fax:  [phone]  Name: Laura Atkins MRN: [id number] Date of Birth: [birth year]

## 2020-11-28 ENCOUNTER — Ambulatory Visit: Payer: Medicare HMO | Admitting: Podiatry

## 2020-11-29 ENCOUNTER — Ambulatory Visit (HOSPITAL_COMMUNITY): Payer: Medicare Other | Admitting: Physical Therapy

## 2020-11-29 ENCOUNTER — Telehealth (HOSPITAL_COMMUNITY): Payer: Self-pay | Admitting: Physical Therapy

## 2020-11-29 NOTE — Telephone Encounter (Signed)
PT did not show for appointment; called pt but there was no answer and phone has not been set up with voice box at this time.  Rayetta Humphrey, [place] CLT [phone]

## 2020-12-01 ENCOUNTER — Telehealth (HOSPITAL_COMMUNITY): Payer: Self-pay | Admitting: Physical Therapy

## 2020-12-01 ENCOUNTER — Ambulatory Visit (HOSPITAL_COMMUNITY): Payer: Medicare Other | Admitting: Physical Therapy

## 2020-12-01 NOTE — Telephone Encounter (Signed)
Patient no show, left message for patient making her aware of missed appointment and reminded her of next appointment.   3:25 PM, 12/01/20 Mearl Latin PT, DPT Physical Therapist at [facility]

## 2020-12-02 ENCOUNTER — Telehealth: Payer: Self-pay | Admitting: *Deleted

## 2020-12-02 NOTE — Telephone Encounter (Signed)
I haven't seen any forms, what were they for and did they come to Dr Moshe Cipro or Pearline Cables who seen her last?

## 2020-12-02 NOTE — Telephone Encounter (Signed)
I do not have any forms for her, im not sure what they are needing?

## 2020-12-02 NOTE — Telephone Encounter (Signed)
They were for personal care services son would like a return call

## 2020-12-02 NOTE — Telephone Encounter (Signed)
Do you have any papers filled out for Laura Atkins? I haven't seen anything

## 2020-12-02 NOTE — Telephone Encounter (Signed)
Son is wondering if the care forms has been sent so they could get some help for pt please advise

## 2020-12-06 ENCOUNTER — Ambulatory Visit (HOSPITAL_COMMUNITY): Payer: Medicare Other | Admitting: Physical Therapy

## 2020-12-06 ENCOUNTER — Encounter (HOSPITAL_COMMUNITY): Payer: Self-pay | Admitting: Physical Therapy

## 2020-12-06 ENCOUNTER — Other Ambulatory Visit: Payer: Self-pay

## 2020-12-06 DIAGNOSIS — M6281 Muscle weakness (generalized): Secondary | ICD-10-CM | POA: Diagnosis not present

## 2020-12-06 DIAGNOSIS — R262 Difficulty in walking, not elsewhere classified: Secondary | ICD-10-CM

## 2020-12-06 DIAGNOSIS — R2689 Other abnormalities of gait and mobility: Secondary | ICD-10-CM

## 2020-12-06 NOTE — Therapy (Signed)
[place], [place], [address] Phone: [phone]   Fax:  [phone]  Physical Therapy Treatment  Patient Details  Name: Laura Atkins MRN: [id number] Date of Birth: 08-08-1941 Referring Provider (PT): Pilar Jarvis   Encounter Date: 12/06/2020   PT End of Session - 12/06/20 1319    Visit Number 16    Number of Visits 22    Date for PT Re-Evaluation 12/08/20    Authorization Type [employer] approved 12 visits from 09/29/20 to 10/27/20; 12 visits approved from 11/01/20 to 12/08/20    Authorization - Visit Number 6    Authorization - Number of Visits 12    Progress Note Due on Visit 20    PT Start Time 1320    PT Stop Time 1400    PT Time Calculation (min) 40 min    Equipment Utilized During Treatment Gait belt    Activity Tolerance Patient limited by fatigue;Patient tolerated treatment well;Patient limited by pain;No increased pain    Behavior During Therapy WFL for tasks assessed/performed           Past Medical History:  Diagnosis Date  . Alpha thalassemia trait 01/26/2010   02/2012: Nl CBC ex H&H-10.7/34.8, MCV-69   . Anemia   . Anxiety   . Anxiety and depression   . Cellulitis 05/09/2017  . Depression   . Diabetes mellitus   . Foot pain, right 04/30/2013  . GERD (gastroesophageal reflux disease)   . Headache(784.0)   . Hyperlipidemia   . Hypertension   . Iron deficiency 01/21/2017  . Microcytic anemia 01/26/2010   02/2012: Nl CBC ex H&H-10.7/34.8, MCV-69   . NECK PAIN, CHRONIC 10/21/2008   +chronic back pain    . Obesity   . Obstructive sleep apnea   . Osteoarthritis    Left knee; right shoulder; chronic neck and back pain  . Pruritus   . PVD (peripheral vascular disease) ([place]) 01/28/2014  . Seizures ([place])   . Shoulder pain, right 04/14/2015  . Tremor    This started months ago after her seizure progressing to very poor hand writing  . Urinary incontinence   . UTI (urinary tract infection) 01/18/2013    Past  Surgical History:  Procedure Laterality Date  . ABDOMINAL HYSTERECTOMY    . BREAST EXCISIONAL BIOPSY     Left; cyst  . CATARACT EXTRACTION Right    12/2017  . CATARACT EXTRACTION W/ INTRAOCULAR LENS IMPLANT Left 09/07/2013  . CHOLECYSTECTOMY    . COLONOSCOPY    . COLONOSCOPY N/A 07/20/2015   Procedure: COLONOSCOPY;  Surgeon: Rogene Houston, MD;  [facility];  Service: Endoscopy;  Laterality: N/A;  930  . EYE SURGERY Left 09/07/2013   cataract    There were no vitals filed for this visit.   Subjective Assessment - 12/06/20 1320    Subjective PT has not been seen since 11/23/2020.  Pt states that she has not been back because her knee has  been bothering her to much.    Patient is accompained by: Family member   husband   Pertinent History HTN, DM, AKI seizure like activity.    How long can you sit comfortably? no problem    How long can you stand comfortably? tested with no assisted device pt needs contact guard for 2:05 was  1:55    How long can you walk comfortably? 2-4 minutes was less than 2    Patient Stated Goals to walk better  Currently in Pain? Yes    Pain Score 8     Pain Location Knee    Pain Orientation Right;Lateral    Pain Descriptors / Indicators Aching    Pain Type Chronic pain    Pain Onset More than a month ago    Pain Frequency Intermittent    Aggravating Factors  wt bearing    Pain Relieving Factors meds    Effect of Pain on Daily Activities limits              [facility] PT Assessment - 12/06/20 0001      Assessment   Medical Diagnosis Difficulty walking    Referring Provider (PT) Pilar Jarvis    Onset Date/Surgical Date 05/27/20    Prior Therapy acute and SNF      Precautions   Precautions Fall      Restrictions   Weight Bearing Restrictions No      [place] residence    Home Access Ramped entrance    [place] One level      Prior Function   Level of Independence Independent with  household mobility with device      Cognition   Overall Cognitive Status Within Functional Limits for tasks assessed      Functional Tests   Functional tests Single leg stance;Sit to Stand      Single Leg Stance   Comments unable      Sit to Stand   Comments unable without UE assist; with UE assist in 30 seconds pt is able to complete 3  repswas 2   inital eval 2 in 30 seconds; 1 month ago 3; now back to 2.     AROM   Right Knee Extension 10   was 10 at eval; 7 last month   Right Knee Flexion 105   was 100   Left Knee Extension 12   was 15   Left Knee Flexion 95   90     Strength   Right Hip Flexion 5/5   was 4/5   Right Hip Extension 3-/5   was 2/5   Right Hip ABduction 4/5   was 4-   Left Hip Flexion 4/5    Left Hip Extension 3-/5   was 2/5   Left Hip ABduction 3+/5   was 3+   Right Knee Flexion 4+/5    Right Knee Extension 5/5   was 4+ at inital eval; 5/5 a month ago   Left Knee Flexion 4+/5   tested prone initally tested sitting   Left Knee Extension 5/5   Eval was 4+, a month ago 5/5   Right Ankle Dorsiflexion 3/5   was 3/5   Left Ankle Dorsiflexion 3+/5   was 3+     Ambulation/Gait   Ambulation/Gait Yes    Ambulation Distance (Feet) 74 Feet   was 58   Assistive device Rolling walker    Gait Pattern Step-to pattern    Gait Comments 2 minute walk testl                                   PT Short Term Goals - 10/27/20 1436      PT SHORT TERM GOAL #1   Title Patient will independently verbalize and demo initial HEP in order to improve B LE strength with ambulation and transfers.     Time 2  Status Achieved    Target Date 10/13/20      PT SHORT TERM GOAL #2   Title PT will be able to complete 5 sit to stand in a 30 second period using UE assist to demonstrate improved power.    Baseline 2/3: 3 in 30 seconds    Time 2    Period Weeks    Status Not Met      PT SHORT TERM GOAL #3   Title PT  will be able to ambulate 114f with a RW in  2 minute time period to demonstrate reduce risk of falling.    Time 2    Period Weeks    Status Achieved      PT SHORT TERM GOAL #4   Status On-going             PT Long Term Goals - 10/27/20 1407      PT LONG TERM GOAL #1   Title PT to be able to come sit to stand without the use of UE to demonstrate improved balance and strength.    Time 4    Period Weeks    Status On-going      PT LONG TERM GOAL #2   Title PT to be able to single leg stance on both LE for 5 seconds to decrease risk of falling    Time 4    Period Weeks    Status On-going      PT LONG TERM GOAL #3   Title PT to be able to walk 2055fwith a rolling walker in a 2 minute timeframe to allow pt to be able to make it to the restroom in time without having an accident    Time 4    Status On-going      PT LONG TERM GOAL #4   Status On-going      PT LONG TERM GOAL #5   Status On-going                 Plan - 12/06/20 1501    Clinical Impression Statement Pt has not returned since 11/24/2020, fairly vague on why she did not return stating her knee hurt and familial problems.  PT reassessed with not significant improvement.  Pt verbalizes that she is not really completing her HEP.  Therapist explained that she can not request more visits if she is not going to completing her HEP as she is ot showing improvement.  PT is to bring her HEP next visit this will be her last visit and the therapist will find 4-5 exercises that will be the most beneficial for the pt to complete at home.    Personal Factors and Comorbidities Fitness;Past/Current Experience;Comorbidity 3+    Comorbidities HTN, seizure like activity, DM, memory issues    Examination-Activity Limitations Caring for Others;Carry;Dressing;Lift;Locomotion Level;Stairs;Stand    Examination-Participation Restrictions Cleaning;Laundry;Meal Prep;Shop    Stability/Clinical Decision Making Evolving/Moderate complexity    Rehab Potential Good    PT Frequency 2x /  week    PT Duration 6 weeks   was 3x a week for 4 weeks now requesting 2x a week for 6 weeks   PT Treatment/Interventions Gait training;Stair training;Functional mobility training;Therapeutic activities;Therapeutic exercise;Balance training;Neuromuscular re-education;Patient/family education    PT Next Visit Plan Sort thru pt HEP and choose which ones pt should concentrate on.   Use today's objective information for formal discharge.    PT Home Exercise Plan 1/10: AP and lateral weight shifts; 1/14: LAQ, sit to  stand at kitchen table, bridge, dead bug and sidelying abduction; STS, hamstring stretch with quad set; 1/27: cervical and thoracic excursions.2/3: side stepping, marching and squat           Patient will benefit from skilled therapeutic intervention in order to improve the following deficits and impairments:  Abnormal gait,Decreased activity tolerance,Decreased balance,Decreased endurance,Decreased mobility,Decreased range of motion,Difficulty walking,Decreased strength,Pain  Visit Diagnosis: Muscle weakness (generalized)  Difficulty in walking, not elsewhere classified  Other abnormalities of gait and mobility     Problem List Patient Active Problem List   Diagnosis Date Noted  . Dysuria 10/18/2020  . Dementia ([place]) 07/13/2020  . Cognitive deficits   . Infection   . Carotid bruit 05/26/2020  . Hyperlipidemia associated with type 2 diabetes mellitus ([place]) 12/08/2019  . Iron deficiency 01/21/2017  . Vitamin D deficiency 10/15/2016  . Urinary incontinence 10/15/2016  . Limited mobility 05/27/2016  . Type 2 diabetes mellitus with hyperglycemia ([place]) 02/04/2016  . Sleep terror disorder 12/14/2015  . Fecal incontinence 12/13/2015  . Back pain of lumbar region with sciatica 04/15/2015  . Spondylosis, cervical, with myelopathy 04/14/2015  . OSA (obstructive sleep apnea) 04/10/2013  . Peripheral neuropathy 03/12/2013  . Hearing loss 11/06/2012  . Seizure-like activity ([place])  01/25/2012  . UNSTEADY GAIT 11/07/2010  . Alpha thalassemia ([place]) 01/26/2010  . Morbid obesity ([place]) 04/21/2008  . Anxiety and depression 04/21/2008  . Essential hypertension 04/21/2008  . GERD 04/21/2008  . Osteoarthritis of left knee 04/21/2008  . URINARY INCONTINENCE 04/21/2008    Rayetta Humphrey, PT CLT [phone] 12/06/2020, 3:06 PM  [facility] [address] [place], [place], [address] Phone: [phone]   Fax:  [phone]  Name: SAMIYYAH MOFFA MRN: [id number] Date of Birth: 1941-08-06

## 2020-12-08 ENCOUNTER — Ambulatory Visit (HOSPITAL_COMMUNITY): Payer: Medicare Other | Admitting: Physical Therapy

## 2020-12-15 ENCOUNTER — Encounter (HOSPITAL_COMMUNITY): Payer: Self-pay | Admitting: Physical Therapy

## 2020-12-15 ENCOUNTER — Other Ambulatory Visit: Payer: Self-pay

## 2020-12-15 ENCOUNTER — Ambulatory Visit (HOSPITAL_COMMUNITY): Payer: Medicare Other | Admitting: Physical Therapy

## 2020-12-15 DIAGNOSIS — M6281 Muscle weakness (generalized): Secondary | ICD-10-CM | POA: Diagnosis not present

## 2020-12-15 DIAGNOSIS — R2689 Other abnormalities of gait and mobility: Secondary | ICD-10-CM

## 2020-12-15 DIAGNOSIS — R262 Difficulty in walking, not elsewhere classified: Secondary | ICD-10-CM

## 2020-12-15 NOTE — Therapy (Signed)
[place], [place], [address] Phone: [phone]   Fax:  [phone]  Physical Therapy Treatment  Patient Details  Name: Laura Atkins MRN: [id number] Date of Birth: 1940/11/14 Referring Provider (PT): Pilar Jarvis  PHYSICAL THERAPY DISCHARGE SUMMARY  Visits from Start of Care: 17  Current functional level related to goals / functional outcomes: See PN note last visit dated 12/08/20   Remaining deficits: See PN note last visit dated 12/08/20     Education / Equipment: See assessment  Plan: Patient agrees to discharge.  Patient goals were not met. Patient is being discharged due to lack of progress.  ?????       Encounter Date: 12/15/2020   PT End of Session - 12/15/20 1657    Visit Number 17    Number of Visits 22    Date for PT Re-Evaluation 12/15/20    Authorization Type [employer] approved 12 visits from 09/29/20 to 10/27/20; 12 visits approved from 11/01/20 to 12/08/20    Authorization - Visit Number 1    Authorization - Number of Visits 1   out of cert, submitted for single visit   Progress Note Due on Visit 20    PT Start Time 1040   arrived late   PT Stop Time 1115    PT Time Calculation (min) 35 min    Equipment Utilized During Treatment Gait belt    Activity Tolerance Patient tolerated treatment well    Behavior During Therapy WFL for tasks assessed/performed           Past Medical History:  Diagnosis Date  . Alpha thalassemia trait 01/26/2010   02/2012: Nl CBC ex H&H-10.7/34.8, MCV-69   . Anemia   . Anxiety   . Anxiety and depression   . Cellulitis 05/09/2017  . Depression   . Diabetes mellitus   . Foot pain, right 04/30/2013  . GERD (gastroesophageal reflux disease)   . Headache(784.0)   . Hyperlipidemia   . Hypertension   . Iron deficiency 01/21/2017  . Microcytic anemia 01/26/2010   02/2012: Nl CBC ex H&H-10.7/34.8, MCV-69   . NECK PAIN, CHRONIC 10/21/2008   +chronic back pain    . Obesity    . Obstructive sleep apnea   . Osteoarthritis    Left knee; right shoulder; chronic neck and back pain  . Pruritus   . PVD (peripheral vascular disease) ([place]) 01/28/2014  . Seizures ([place])   . Shoulder pain, right 04/14/2015  . Tremor    This started months ago after her seizure progressing to very poor hand writing  . Urinary incontinence   . UTI (urinary tract infection) 01/18/2013    Past Surgical History:  Procedure Laterality Date  . ABDOMINAL HYSTERECTOMY    . BREAST EXCISIONAL BIOPSY     Left; cyst  . CATARACT EXTRACTION Right    12/2017  . CATARACT EXTRACTION W/ INTRAOCULAR LENS IMPLANT Left 09/07/2013  . CHOLECYSTECTOMY    . COLONOSCOPY    . COLONOSCOPY N/A 07/20/2015   Procedure: COLONOSCOPY;  Surgeon: Rogene Houston, MD;  Location: [facility];  Service: Endoscopy;  Laterality: N/A;  930  . EYE SURGERY Left 09/07/2013   cataract    There were no vitals filed for this visit.   Subjective Assessment - 12/15/20 1654    Subjective Patient says she wants to get better. She is still having pain in her knees. She says she is trying home exercises but has a hard time  sticking to them sometimes. She lost some of her papers and isn't sure which ones she should be doing.    Patient is accompained by: Family member   husband   Pertinent History HTN, DM, AKI seizure like activity.    How long can you sit comfortably? no problem    How long can you stand comfortably? tested with no assisted device pt needs contact guard for 2:05 was  1:55    How long can you walk comfortably? 2-4 minutes was less than 2    Patient Stated Goals to walk better    Currently in Pain? Yes    Pain Score 8     Pain Location Knee    Pain Orientation Right    Pain Descriptors / Indicators Aching    Pain Type Chronic pain    Pain Onset More than a month ago    Pain Frequency Constant    Aggravating Factors  WB    Pain Relieving Factors Meds    Effect of Pain on Daily Activities Limits                                      PT Education - 12/15/20 1656    Education Details on importance of adherence to HEP for making progress with knee pain and functional mobility for transition to DC today. On prior exercises and updated HEP exercise for knee pain and functional mobility.    Person(s) Educated Patient;Spouse    Methods Explanation;Handout    Comprehension Verbalized understanding            PT Short Term Goals - 10/27/20 1436      PT SHORT TERM GOAL #1   Title Patient will independently verbalize and demo initial HEP in order to improve B LE strength with ambulation and transfers.     Time 2    Status Achieved    Target Date 10/13/20      PT SHORT TERM GOAL #2   Title PT will be able to complete 5 sit to stand in a 30 second period using UE assist to demonstrate improved power.    Baseline 2/3: 3 in 30 seconds    Time 2    Period Weeks    Status Not Met      PT SHORT TERM GOAL #3   Title PT  will be able to ambulate 158f with a RW in 2 minute time period to demonstrate reduce risk of falling.    Time 2    Period Weeks    Status Achieved      PT SHORT TERM GOAL #4   Status On-going             PT Long Term Goals - 12/15/20 1708      PT LONG TERM GOAL #1   Title PT to be able to come sit to stand without the use of UE to demonstrate improved balance and strength.    Time 4    Period Weeks    Status Not Met      PT LONG TERM GOAL #2   Title PT to be able to single leg stance on both LE for 5 seconds to decrease risk of falling    Time 4    Period Weeks    Status Not Met      PT LONG TERM GOAL #3   Title PT to be  able to walk 264f with a rolling walker in a 2 minute timeframe to allow pt to be able to make it to the restroom in time without having an accident    Time 4    Status Not Met      PT LONG TERM GOAL #4   Status --      PT LONG TERM GOAL #5   Status --                 Plan - 12/15/20 1659     Clinical Impression Statement Continued with POC as stated from most recent therapy session. Discussed DC today with patient and her husband and reviewed HEP. Patient verbalized that she has some trouble with adhering to HEP, and her husband relays that it is sometimes difficult to motivate her to do so. Discussed tactics for improving adherence and motivation with HEP, and that ultimately, it is up to her to initiate improvements at home in order to continue to progress with skilled therapy. Patient and her husband were understanding of this and agree with this plan. Reviewed updated HEP and issued handout. Answered all patient questions and encouraged patient to follow up with therapy services with any further questions or concerns.    Personal Factors and Comorbidities Fitness;Past/Current Experience;Comorbidity 3+    Comorbidities HTN, seizure like activity, DM, memory issues    Examination-Activity Limitations Caring for Others;Carry;Dressing;Lift;Locomotion Level;Stairs;Stand    Examination-Participation Restrictions Cleaning;Laundry;Meal Prep;Shop    Stability/Clinical Decision Making Evolving/Moderate complexity    Rehab Potential Good    PT Frequency --    PT Duration --    PT Treatment/Interventions Gait training;Stair training;Functional mobility training;Therapeutic activities;Therapeutic exercise;Balance training;Neuromuscular re-education;Patient/family education    PT Next Visit Plan DC to HEP    PT Home Exercise Plan 1/10: AP and lateral weight shifts; 1/14: LAQ, sit to stand at kitchen table, bridge, dead bug and sidelying abduction; STS, hamstring stretch with quad set; 1/27: cervical and thoracic excursions.2/3: side stepping, marching and squat    Consulted and Agree with Plan of Care Patient;Family member/caregiver    Family Member Consulted Husband           Patient will benefit from skilled therapeutic intervention in order to improve the following deficits and impairments:   Abnormal gait,Decreased activity tolerance,Decreased balance,Decreased endurance,Decreased mobility,Decreased range of motion,Difficulty walking,Decreased strength,Pain  Visit Diagnosis: Muscle weakness (generalized)  Difficulty in walking, not elsewhere classified  Other abnormalities of gait and mobility     Problem List Patient Active Problem List   Diagnosis Date Noted  . Dysuria 10/18/2020  . Dementia ([place] 07/13/2020  . Cognitive deficits   . Infection   . Carotid bruit 05/26/2020  . Hyperlipidemia associated with type 2 diabetes mellitus ([place] 12/08/2019  . Iron deficiency 01/21/2017  . Vitamin D deficiency 10/15/2016  . Urinary incontinence 10/15/2016  . Limited mobility 05/27/2016  . Type 2 diabetes mellitus with hyperglycemia ([place] 02/04/2016  . Sleep terror disorder 12/14/2015  . Fecal incontinence 12/13/2015  . Back pain of lumbar region with sciatica 04/15/2015  . Spondylosis, cervical, with myelopathy 04/14/2015  . OSA (obstructive sleep apnea) 04/10/2013  . Peripheral neuropathy 03/12/2013  . Hearing loss 11/06/2012  . Seizure-like activity ([facility] 01/25/2012  . UNSTEADY GAIT 11/07/2010  . Alpha thalassemia ([place] 01/26/2010  . Morbid obesity ([facility] 04/21/2008  . Anxiety and depression 04/21/2008  . Essential hypertension 04/21/2008  . GERD 04/21/2008  . Osteoarthritis of left knee 04/21/2008  . URINARY INCONTINENCE 04/21/2008  5:13 PM, 12/15/20 Josue Hector PT DPT  Physical Therapist with [facility]  [phone] [place] [address] [place], [place], [address] Phone: [phone]   Fax:  [phone]  Name: Laura Atkins MRN: [id number] Date of Birth: 1941/04/27

## 2020-12-19 ENCOUNTER — Other Ambulatory Visit: Payer: Self-pay | Admitting: Family Medicine

## 2020-12-20 ENCOUNTER — Ambulatory Visit: Payer: Medicare HMO | Admitting: Endocrinology

## 2020-12-26 ENCOUNTER — Other Ambulatory Visit: Payer: Self-pay

## 2020-12-26 ENCOUNTER — Encounter: Payer: Self-pay | Admitting: Family Medicine

## 2020-12-26 ENCOUNTER — Ambulatory Visit (INDEPENDENT_AMBULATORY_CARE_PROVIDER_SITE_OTHER): Payer: Medicare HMO | Admitting: Family Medicine

## 2020-12-26 ENCOUNTER — Encounter: Payer: Self-pay | Admitting: Endocrinology

## 2020-12-26 VITALS — BP 130/76 | HR 76 | Temp 97.1°F | Ht 68.0 in | Wt 246.0 lb

## 2020-12-26 DIAGNOSIS — G63 Polyneuropathy in diseases classified elsewhere: Secondary | ICD-10-CM | POA: Diagnosis not present

## 2020-12-26 DIAGNOSIS — Z9911 Dependence on respirator [ventilator] status: Secondary | ICD-10-CM

## 2020-12-26 DIAGNOSIS — M1712 Unilateral primary osteoarthritis, left knee: Secondary | ICD-10-CM

## 2020-12-26 DIAGNOSIS — Z7409 Other reduced mobility: Secondary | ICD-10-CM

## 2020-12-26 DIAGNOSIS — M4712 Other spondylosis with myelopathy, cervical region: Secondary | ICD-10-CM

## 2020-12-26 DIAGNOSIS — E1165 Type 2 diabetes mellitus with hyperglycemia: Secondary | ICD-10-CM | POA: Diagnosis not present

## 2020-12-26 DIAGNOSIS — L89610 Pressure ulcer of right heel, unstageable: Secondary | ICD-10-CM | POA: Diagnosis not present

## 2020-12-26 DIAGNOSIS — K219 Gastro-esophageal reflux disease without esophagitis: Secondary | ICD-10-CM

## 2020-12-26 DIAGNOSIS — F419 Anxiety disorder, unspecified: Secondary | ICD-10-CM

## 2020-12-26 DIAGNOSIS — I10 Essential (primary) hypertension: Secondary | ICD-10-CM

## 2020-12-26 DIAGNOSIS — F0390 Unspecified dementia without behavioral disturbance: Secondary | ICD-10-CM

## 2020-12-26 DIAGNOSIS — R159 Full incontinence of feces: Secondary | ICD-10-CM

## 2020-12-26 DIAGNOSIS — Z1159 Encounter for screening for other viral diseases: Secondary | ICD-10-CM | POA: Diagnosis not present

## 2020-12-26 DIAGNOSIS — E1169 Type 2 diabetes mellitus with other specified complication: Secondary | ICD-10-CM | POA: Diagnosis not present

## 2020-12-26 DIAGNOSIS — N184 Chronic kidney disease, stage 4 (severe): Secondary | ICD-10-CM

## 2020-12-26 DIAGNOSIS — Z1231 Encounter for screening mammogram for malignant neoplasm of breast: Secondary | ICD-10-CM | POA: Diagnosis not present

## 2020-12-26 DIAGNOSIS — R269 Unspecified abnormalities of gait and mobility: Secondary | ICD-10-CM

## 2020-12-26 DIAGNOSIS — R300 Dysuria: Secondary | ICD-10-CM

## 2020-12-26 DIAGNOSIS — D560 Alpha thalassemia: Secondary | ICD-10-CM

## 2020-12-26 DIAGNOSIS — R569 Unspecified convulsions: Secondary | ICD-10-CM

## 2020-12-26 DIAGNOSIS — R32 Unspecified urinary incontinence: Secondary | ICD-10-CM

## 2020-12-26 DIAGNOSIS — F32A Depression, unspecified: Secondary | ICD-10-CM

## 2020-12-26 DIAGNOSIS — E785 Hyperlipidemia, unspecified: Secondary | ICD-10-CM

## 2020-12-26 DIAGNOSIS — M5440 Lumbago with sciatica, unspecified side: Secondary | ICD-10-CM

## 2020-12-26 DIAGNOSIS — Z794 Long term (current) use of insulin: Secondary | ICD-10-CM

## 2020-12-26 MED ORDER — UNABLE TO FIND: 3 refills | Status: DC

## 2020-12-26 MED ORDER — UNABLE TO FIND: 0 refills | Status: DC

## 2020-12-26 NOTE — Assessment & Plan Note (Addendum)
Uncontrolled needs Endo appt Laura Atkins is reminded of the importance of commitment to daily physical activity for 30 minutes or more, as able and the need to limit carbohydrate intake to 30 to 60 grams per meal to help with blood sugar control.   The need to take medication as prescribed, test blood sugar as directed, and to call between visits if there is a concern that blood sugar is uncontrolled is also discussed.   Laura Atkins is reminded of the importance of daily foot exam, annual eye examination, and good blood sugar, blood pressure and cholesterol control.  Diabetic Labs Latest Ref Rng & Units 11/17/2020 06/30/2020 06/03/2020 06/02/2020 06/01/2020  HbA1c 4.0 - 5.6 % 9.5(A) 8.3(A) - - -  Microalbumin mg/dL - - - - -  Micro/Creat Ratio 0.0 - 30.0 mg/g creat - - - - -  Chol <200 mg/dL - - - - -  HDL > OR = 50 mg/dL - - - - -  Calc LDL mg/dL (calc) - - - - -  Triglycerides <150 mg/dL - - - - -  Creatinine 0.44 - 1.00 mg/dL - - 1.13(H) 1.17(H) 1.54(H)   BP/Weight 12/26/2020 11/18/2020 11/17/2020 11/08/2020 10/18/2020 09/27/2020 [id number]  Systolic BP [id number] [id number]  Diastolic BP 76 66 62 58 68 51 66  Wt. (Lbs) 246 254 254.4 - 260 258 255  BMI 37.4 38.62 38.68 - 39.53 39.23 38.77  Some encounter information is confidential and restricted. Go to Review Flowsheets activity to see all data.   Foot/eye exam completion dates Latest Ref Rng & Units 11/17/2020 08/10/2020  Eye Exam No Retinopathy - -  Foot Form Completion - Done Done

## 2020-12-26 NOTE — Patient Instructions (Addendum)
Annual exam in office with MD in 3 months, call if you need me before  Please schedule mammogram at checkout ([place])  Fasting lipid, cmp and eGFr aND HEPATITIS C SCREEN ASAP, Ray, collect with bloodwork, none today  YOU NEED AN APPOINTMENT WITH [facility] WE WILL GET THIS  NEED TO COMMIT TO TAKING INSULIN DAILY  CAREFUL NOT TO FALL  Thanks for choosing [facility], we consider it a privelige to serve you.

## 2020-12-28 NOTE — Telephone Encounter (Signed)
Had an office visit with provider

## 2021-01-02 ENCOUNTER — Telehealth: Payer: Self-pay

## 2021-01-02 NOTE — Telephone Encounter (Signed)
Will send to [employer] [facility]

## 2021-01-02 NOTE — Telephone Encounter (Signed)
[employer] does not carry or bill for [employer] any longer

## 2021-01-06 ENCOUNTER — Encounter: Payer: Self-pay | Admitting: Family Medicine

## 2021-01-06 ENCOUNTER — Ambulatory Visit (HOSPITAL_COMMUNITY): Payer: Medicare HMO

## 2021-01-06 DIAGNOSIS — L89610 Pressure ulcer of right heel, unstageable: Secondary | ICD-10-CM | POA: Insufficient documentation

## 2021-01-06 DIAGNOSIS — G63 Polyneuropathy in diseases classified elsewhere: Secondary | ICD-10-CM | POA: Insufficient documentation

## 2021-01-06 DIAGNOSIS — N184 Chronic kidney disease, stage 4 (severe): Secondary | ICD-10-CM | POA: Insufficient documentation

## 2021-01-06 NOTE — Progress Notes (Signed)
Laura Atkins     MRN: [id number]      DOB: [birth year]   HPI Laura Atkins is here for follow up and re-evaluation of chronic medical conditions, medication management and review of any available recent lab and radiology data.     Questions or concerns regarding consultations or procedures which the PT has had in the interim are  addressed. The PT denies any adverse reactions to current medications since the last visit.  There are no new concerns.  There are no specific complaints , states not testing blood sugar regularly and often over 200  ROS Denies recent fever or chills. Denies sinus pressure, nasal congestion, ear pain or sore throat. Denies chest congestion, productive cough or wheezing. Denies chest pains, palpitations and leg swelling Denies abdominal pain, nausea, vomiting,diarrhea or constipation.   Denies dysuria, frequency, hesitancy or uncontrolled  incontinence. C/o  joint pain, swelling and limitation in mobility.Denies any falls Denies headaches, seizures, numbness, or tingling. Denies uncontrolled epression, anxiety or insomnia. Denies skin break down or rash.   PE  BP 130/76   Pulse 76   Temp (!) 97.1 F (36.2 C) (Temporal)   Ht '5\' 8"'$  (1.727 m)   Wt 246 lb (111.6 kg)   SpO2 98%   BMI 37.40 kg/m    Patient alert and oriented and in no cardiopulmonary distress.  HEENT: No facial asymmetry, EOMI,     Neck supple .  Chest: Clear to auscultation bilaterally.  CVS: S1, S2 no murmurs, no S3.Regular rate.  ABD: Soft non tender.   Ext: No edema  [id number] reduced  ROM spine, shoulders, hips and knees.  Skin: Intact, no ulcerations or rash noted.  Psych: Good eye contact, normal affect. Memory impaired not anxious or depressed appearing.  CNS: CN 2-12 intact, power,  normal throughout.no focal deficits noted.   Assessment & Plan  Type 2 diabetes mellitus with hyperglycemia (HCC) Uncontrolled needs Endo appt Laura Atkins is reminded of the  importance of commitment to daily physical activity for 30 minutes or more, as able and the need to limit carbohydrate intake to 30 to 60 grams per meal to help with blood sugar control.   The need to take medication as prescribed, test blood sugar as directed, and to call between visits if there is a concern that blood sugar is uncontrolled is also discussed.   Laura Atkins is reminded of the importance of daily foot exam, annual eye examination, and good blood sugar, blood pressure and cholesterol control.  Diabetic Labs Latest Ref Rng & Units 11/17/2020 06/30/2020 06/03/2020 06/02/2020 06/01/2020  HbA1c 4.0 - 5.6 % 9.5(A) 8.3(A) - - -  Microalbumin mg/dL - - - - -  Micro/Creat Ratio 0.0 - 30.0 mg/g creat - - - - -  Chol <200 mg/dL - - - - -  HDL > OR = 50 mg/dL - - - - -  Calc LDL mg/dL (calc) - - - - -  Triglycerides <150 mg/dL - - - - -  Creatinine 0.44 - 1.00 mg/dL - - 1.13(H) 1.17(H) 1.54(H)   BP/Weight 12/26/2020 11/18/2020 11/17/2020 11/08/2020 10/18/2020 09/27/2020 [id number]  Systolic BP [id number] 123456 [id number] [id number] 0000000 [id number]  Diastolic BP 76 66 62 58 68 51 66  Wt. (Lbs) 246 254 254.4 - 260 258 255  BMI 37.4 38.62 38.68 - 39.53 39.23 38.77  Some encounter information is confidential and restricted. Go to Review Flowsheets activity to see all data.   Foot/eye exam completion dates Latest  Ref Rng & Units 11/17/2020 08/10/2020  Eye Exam No Retinopathy - -  Foot Form Completion - Done Done        Essential hypertension Controlled, no change in medication DASH diet and commitment to daily physical activity for a minimum of 30 minutes discussed and encouraged, as a part of hypertension management. The importance of attaining a healthy weight is also discussed.  BP/Weight 12/26/2020 11/18/2020 11/17/2020 11/08/2020 10/18/2020 09/27/2020 [id number]  Systolic BP [id number] 123456 [id number] AB-[id number] 0000000 [id number]  Diastolic BP 76 66 62 58 68 51 66  Wt. (Lbs) 246 254 254.4 - 260 258 255  BMI 37.4 38.62 38.68 - 39.53 39.23 38.77  Some  encounter information is confidential and restricted. Go to Review Flowsheets activity to see all data.          Morbid obesity  Patient re-educated about  the importance of commitment to a  minimum of 150 minutes of exercise per week as able.  The importance of healthy food choices with portion control discussed, as well as eating regularly and within a 12 hour window most days. The need to choose "clean , green" food 50 to 75% of the time is discussed, as well as to make water the primary drink and set a goal of 64 ounces water daily.    Weight /BMI 12/26/2020 11/18/2020 11/17/2020  WEIGHT 246 lb 254 lb 254 lb 6.4 oz  HEIGHT '5\' 8"'$  '5\' 8"'$  '5\' 8"'$   BMI 37.4 kg/m2 38.62 kg/m2 38.68 kg/m2  Some encounter information is confidential and restricted. Go to Review Flowsheets activity to see all data.      Anxiety and depression Controlled, no change in medication   Urinary incontinence Improved and adequately controlled on current regime, uses supplies and on no medication  Limited mobility Home safety reviewed, no falls reported  Dementia (HCC) Continue aricept daily, will uptitrate as tolerated, needs MMSE at next visit  GERD Controlled, no change in medication   Hyperlipidemia associated with type 2 diabetes mellitus ([place]) Hyperlipidemia:Low fat diet discussed and encouraged.   Lipid Panel  Lab Results  Component Value Date   CHOL 149 12/01/2019   HDL 58 12/01/2019   LDLCALC 72 12/01/2019   TRIG 100 12/01/2019   CHOLHDL 2.6 12/01/2019     Updated lab needed at/ before next visit.

## 2021-01-06 NOTE — Assessment & Plan Note (Signed)
Controlled, no change in medication  

## 2021-01-06 NOTE — Assessment & Plan Note (Signed)
Hyperlipidemia:Low fat diet discussed and encouraged.   Lipid Panel  Lab Results  Component Value Date   CHOL 149 12/01/2019   HDL 58 12/01/2019   LDLCALC 72 12/01/2019   TRIG 100 12/01/2019   CHOLHDL 2.6 12/01/2019     Updated lab needed at/ before next visit.

## 2021-01-06 NOTE — Assessment & Plan Note (Addendum)
Controlled, no change in medication DASH diet and commitment to daily physical activity for a minimum of 30 minutes discussed and encouraged, as a part of hypertension management. The importance of attaining a healthy weight is also discussed.  BP/Weight 12/26/2020 11/18/2020 11/17/2020 11/08/2020 10/18/2020 09/27/2020 [id number]  Systolic BP [id number] 123456 [id number] AB-[id number] 0000000 Q000111Q  Diastolic BP 76 66 62 58 68 51 66  Wt. (Lbs) 246 254 254.4 - 260 258 255  BMI 37.4 38.62 38.68 - 39.53 39.23 38.77  Some encounter information is confidential and restricted. Go to Review Flowsheets activity to see all data.

## 2021-01-06 NOTE — Assessment & Plan Note (Addendum)
Improved and adequately controlled on current regime, uses supplies and on no medication

## 2021-01-06 NOTE — Assessment & Plan Note (Signed)
Home safety reviewed, no falls reported 

## 2021-01-06 NOTE — Assessment & Plan Note (Signed)
Continue aricept daily, will uptitrate as tolerated, needs MMSE at next visit

## 2021-01-06 NOTE — Assessment & Plan Note (Signed)
  Patient re-educated about  the importance of commitment to a  minimum of 150 minutes of exercise per week as able.  The importance of healthy food choices with portion control discussed, as well as eating regularly and within a 12 hour window most days. The need to choose "clean , green" food 50 to 75% of the time is discussed, as well as to make water the primary drink and set a goal of 64 ounces water daily.    Weight /BMI 12/26/2020 11/18/2020 11/17/2020  WEIGHT 246 lb 254 lb 254 lb 6.4 oz  HEIGHT '5\' 8"'$  '5\' 8"'$  '5\' 8"'$   BMI 37.4 kg/m2 38.62 kg/m2 38.68 kg/m2  Some encounter information is confidential and restricted. Go to Review Flowsheets activity to see all data.

## 2021-01-16 ENCOUNTER — Encounter: Payer: Self-pay | Admitting: Emergency Medicine

## 2021-01-16 ENCOUNTER — Ambulatory Visit
Admission: EM | Admit: 2021-01-16 | Discharge: 2021-01-16 | Disposition: A | Discharge status: Home / Self Care | Payer: Medicare HMO | Attending: Family Medicine | Admitting: Family Medicine

## 2021-01-16 DIAGNOSIS — M1712 Unilateral primary osteoarthritis, left knee: Secondary | ICD-10-CM | POA: Diagnosis not present

## 2021-01-16 DIAGNOSIS — S8992XA Unspecified injury of left lower leg, initial encounter: Secondary | ICD-10-CM

## 2021-01-16 MED ORDER — TIZANIDINE HCL 2 MG PO TABS: 2.0000 mg | ORAL_TABLET | Freq: Four times a day (QID) | ORAL | 0 refills | Status: DC | PRN

## 2021-01-16 NOTE — Discharge Instructions (Addendum)
Follow-up with orthopedics for evaluation of chronic arthritis involving the left knee which I suspect is causing your pain and make be contributing to your falling. No fracture suspect. Suspect joint inflammation from fall. Take tylenol 500 mg every 6 hours as needed for pain. Apply heat directly to knee. For knee pain at bedtime, start tizanidine 2 mg at bedtime.  Medication causes drowsiness.  Wrap knee daily with ace wrap daily and remove for bathing at bedtime.

## 2021-01-16 NOTE — ED Triage Notes (Addendum)
Pt rolled out of bed on Sunday.  Having pain to LT knee.  Pt ambulatory from waiting room to room

## 2021-01-19 ENCOUNTER — Ambulatory Visit: Payer: Medicare HMO | Admitting: Nurse Practitioner

## 2021-01-23 ENCOUNTER — Telehealth: Payer: Self-pay

## 2021-01-23 NOTE — Telephone Encounter (Signed)
Patient son Mr Cletus Gash called he was rude on the phone. Asking for application to be sent for his mom to have nurse come out to her home. Explain his mother would have to designated release before we can give any information to him.

## 2021-01-25 ENCOUNTER — Other Ambulatory Visit: Payer: Self-pay | Admitting: Family Medicine

## 2021-01-26 ENCOUNTER — Ambulatory Visit (INDEPENDENT_AMBULATORY_CARE_PROVIDER_SITE_OTHER): Payer: Medicare HMO | Admitting: Family Medicine

## 2021-01-26 ENCOUNTER — Other Ambulatory Visit: Payer: Self-pay

## 2021-01-26 ENCOUNTER — Encounter: Payer: Self-pay | Admitting: Family Medicine

## 2021-01-26 ENCOUNTER — Other Ambulatory Visit (HOSPITAL_COMMUNITY): Payer: Self-pay | Admitting: Psychiatry

## 2021-01-26 VITALS — BP 134/78 | HR 84 | Temp 97.0°F | Ht 68.0 in | Wt 264.0 lb

## 2021-01-26 DIAGNOSIS — M1712 Unilateral primary osteoarthritis, left knee: Secondary | ICD-10-CM

## 2021-01-26 DIAGNOSIS — Z1159 Encounter for screening for other viral diseases: Secondary | ICD-10-CM | POA: Diagnosis not present

## 2021-01-26 DIAGNOSIS — I10 Essential (primary) hypertension: Secondary | ICD-10-CM

## 2021-01-26 NOTE — Progress Notes (Signed)
   Laura Atkins     MRN: [id number]      DOB: 1940/10/13   HPI Laura Atkins is here with a c/o left knee and shoulder pain following a fall 2 weeks ago. She also injured her scalp, this is healed and her shoulder is much better. Main concern is with the left knee which remains tender , and swollen with reduced mobility   ROS Denies recent fever or chills. Denies sinus pressure, nasal congestion, ear pain or sore throat. Denies chest congestion, productive cough or wheezing. Denies chest pains, palpitations and leg swelling Denies abdominal pain, nausea, vomiting,diarrhea or constipation.   Chronic incontinence  Denies headaches, seizures, numbness, or tingling. Denies uncontrolled depression, anxiety or insomnia.  PE  BP 134/78 (BP Location: Right Arm, Patient Position: Sitting, Cuff Size: Large)   Pulse 84   Temp (!) 97 F (36.1 C) (Temporal)   Ht '5\' 8"'$  (1.727 m)   Wt 264 lb (119.7 kg)   SpO2 96%   BMI 40.14 kg/m   Patient alert and oriented and in no cardiopulmonary distress.  HEENT: No facial asymmetry, EOMI,     Neck adequate ROM  Chest: Clear to auscultation bilaterally.  CVS: S1, S2 no murmurs, no S3.Regular rate.  ABD: Soft non tender.   Ext: No edema  MS: decreased  ROM spine, shoulders, hips and knees.Marked swelling and tenderness of left knee with reduced ROM and crepitus  Skin: Intact, healed scar on scalp, no other abrasions or lacerations over areas of trauma  Psych: Good eye contact, normal affect. Memory intact not anxious or depressed appearing.  CNS: CN 2-12 intact, power,  normal throughout.no focal deficits noted.   Assessment & Plan  Osteoarthritis of left knee Increased pain and swelling s/p fall x 2 weeks, tender in anterior and lateral aspects, refer Ortho  Essential hypertension Controlled, no change in medication   Morbid obesity  Patient re-educated about  the importance of commitment to a  minimum of 150 minutes of exercise per  week as able.  The importance of healthy food choices with portion control discussed, as well as eating regularly and within a 12 hour window most days. The need to choose "clean , green" food 50 to 75% of the time is discussed, as well as to make water the primary drink and set a goal of 64 ounces water daily.    Weight /BMI 01/26/2021 12/26/2020 11/18/2020  WEIGHT 264 lb 246 lb 254 lb  HEIGHT '5\' 8"'$  '5\' 8"'$  '5\' 8"'$   BMI 40.14 kg/m2 37.4 kg/m2 38.62 kg/m2  Some encounter information is confidential and restricted. Go to Review Flowsheets activity to see all data.

## 2021-01-26 NOTE — Assessment & Plan Note (Addendum)
Increased pain and swelling s/p fall x 2 weeks, tender in anterior and lateral aspects, refer Ortho

## 2021-01-26 NOTE — Patient Instructions (Addendum)
F/u as before, call if you need me soon.   You are referred to Orthopedics for management of your left knee, we will get appointment information for you  Keep blood pressure down  Please, NO MORE falls!  Thanks for choosing [facility], we consider it a privelige to serve you.

## 2021-01-26 NOTE — Telephone Encounter (Signed)
Please call pt to ask if she is still on these. Not seen for 2 years

## 2021-01-27 ENCOUNTER — Encounter: Payer: Self-pay | Admitting: Family Medicine

## 2021-01-27 NOTE — Assessment & Plan Note (Signed)
Controlled, no change in medication  

## 2021-01-27 NOTE — Assessment & Plan Note (Signed)
  Patient re-educated about  the importance of commitment to a  minimum of 150 minutes of exercise per week as able.  The importance of healthy food choices with portion control discussed, as well as eating regularly and within a 12 hour window most days. The need to choose "clean , green" food 50 to 75% of the time is discussed, as well as to make water the primary drink and set a goal of 64 ounces water daily.    Weight /BMI 01/26/2021 12/26/2020 11/18/2020  WEIGHT 264 lb 246 lb 254 lb  HEIGHT '5\' 8"'$  '5\' 8"'$  '5\' 8"'$   BMI 40.14 kg/m2 37.4 kg/m2 38.62 kg/m2  Some encounter information is confidential and restricted. Go to Review Flowsheets activity to see all data.

## 2021-01-30 ENCOUNTER — Other Ambulatory Visit (HOSPITAL_COMMUNITY): Payer: Self-pay | Admitting: Psychiatry

## 2021-01-30 ENCOUNTER — Telehealth (HOSPITAL_COMMUNITY): Payer: Self-pay | Admitting: *Deleted

## 2021-01-30 MED ORDER — SERTRALINE HCL 100 MG PO TABS: 100.0000 mg | ORAL_TABLET | Freq: Every day | ORAL | 0 refills | Status: DC

## 2021-01-30 MED ORDER — BUPROPION HCL ER (XL) 150 MG PO TB24: 1.0000 | ORAL_TABLET | Freq: Every morning | ORAL | 0 refills | Status: DC

## 2021-01-30 NOTE — Telephone Encounter (Signed)
Per pt she is needing refills for her medications provider is providing her. Per pt is with [facility] drug. Pt appt is scheduled for this Thursday.

## 2021-01-30 NOTE — Telephone Encounter (Signed)
sent 

## 2021-01-31 ENCOUNTER — Ambulatory Visit: Payer: Medicare HMO

## 2021-01-31 ENCOUNTER — Other Ambulatory Visit: Payer: Self-pay

## 2021-01-31 ENCOUNTER — Encounter: Payer: Self-pay | Admitting: Orthopedic Surgery

## 2021-01-31 ENCOUNTER — Ambulatory Visit: Payer: Medicare HMO | Admitting: Orthopedic Surgery

## 2021-01-31 VITALS — BP 150/74 | HR 98 | Ht 68.0 in | Wt 264.0 lb

## 2021-01-31 DIAGNOSIS — M1712 Unilateral primary osteoarthritis, left knee: Secondary | ICD-10-CM

## 2021-01-31 DIAGNOSIS — M25562 Pain in left knee: Secondary | ICD-10-CM

## 2021-01-31 DIAGNOSIS — W19XXXA Unspecified fall, initial encounter: Secondary | ICD-10-CM

## 2021-01-31 NOTE — Patient Instructions (Signed)
Voltaren gel - available over the counter  Ice your knee  F/u 3 weeks - consider a steroid injection

## 2021-01-31 NOTE — Progress Notes (Signed)
New Patient Visit  Assessment: Laura Atkins is a 80 y.o. female with the following: Severe left knee arthritis  Plan: Reviewed radiographs with the patient in clinic today.  There is no obvious acute injury.  However, given the mechanism of direct impact after a fall, it is possible that there is a nondisplaced fracture through the lateral tibial plateau.  Based on the radiographs, there is no lateral joint line depression.  Nonetheless, I would like to allow further time to see if her pain improves, which would be likely if it were caused by a fracture.  If she continues to have severe pain at the next visit, I would recommend proceeding with a steroid injection.  This was discussed with the patient today, and she is understandably concerned about an injection.  I also explained to her that she has severe arthritis throughout the left knee, and this can lead to worsening pain, even after a fall like this.  She stated her understanding.  Continue with activities as tolerated.  Medications as needed.   Follow-up: Return in about 3 weeks (around 02/21/2021).  Subjective:  Chief Complaint  Patient presents with  . Knee Pain    Lt knee pain after fall approx. 3 wks ago.     History of Present Illness: Laura Atkins is a 80 y.o. female who has been referred to clinic today by Tula Nakayama, MD for evaluation of left knee pain.  She states that she has a history of left knee arthritis, but approximately 3 weeks ago, she fell directly onto the left knee.  Since then, she has had severe pain.  She was evaluated at an urgent care, and provided with some pain medication.  She states the pain medication helps, but makes her very drowsy.  She says she falls asleep when she takes pain medications.  She has noted some swelling throughout the left knee.  Pain is localized to the lateral aspect of the left knee.  She has had difficulty ambulating while using a walker.  Prior to the fall, she had some  difficulty walking, but is gotten worse recently.   Review of Systems: No fevers or chills No chest pain No shortness of breath No bowel or bladder dysfunction No GI distress No headaches   Medical History:  Past Medical History:  Diagnosis Date  . Alpha thalassemia trait 01/26/2010   02/2012: Nl CBC ex H&H-10.7/34.8, MCV-69   . Anemia   . Anxiety   . Anxiety and depression   . Cellulitis 05/09/2017  . Depression   . Diabetes mellitus   . Foot pain, right 04/30/2013  . GERD (gastroesophageal reflux disease)   . Headache(784.0)   . Hyperlipidemia   . Hypertension   . Iron deficiency 01/21/2017  . Microcytic anemia 01/26/2010   02/2012: Nl CBC ex H&H-10.7/34.8, MCV-69   . NECK PAIN, CHRONIC 10/21/2008   +chronic back pain    . Obesity   . Obstructive sleep apnea   . Osteoarthritis    Left knee; right shoulder; chronic neck and back pain  . Pruritus   . PVD (peripheral vascular disease) ([place]) 01/28/2014  . Seizures ([place])   . Shoulder pain, right 04/14/2015  . Tremor    This started months ago after her seizure progressing to very poor hand writing  . Urinary incontinence   . UTI (urinary tract infection) 01/18/2013    Past Surgical History:  Procedure Laterality Date  . ABDOMINAL HYSTERECTOMY    . BREAST EXCISIONAL BIOPSY  Left; cyst  . CATARACT EXTRACTION Right    12/2017  . CATARACT EXTRACTION W/ INTRAOCULAR LENS IMPLANT Left 09/07/2013  . CHOLECYSTECTOMY    . COLONOSCOPY    . COLONOSCOPY N/A 07/20/2015   Procedure: COLONOSCOPY;  Surgeon: Rogene Houston, MD;  [facility];  Service: Endoscopy;  Laterality: N/A;  930  . EYE SURGERY Left 09/07/2013   cataract    Family History  Problem Relation Age of Onset  . Lung cancer Mother 42  . Kidney disease Father   . Diabetes Sister   . Keloids Brother   . ADD / ADHD Grandchild   . Bipolar disorder Grandchild   . Bipolar disorder Daughter   . Seizures Daughter   . Heart disease Daughter   . Kidney  disease Son   . Neuropathy Son   . Kidney disease Son   . Edema Daughter   . Breast cancer Daughter 85  . Allergies Daughter   . Alcohol abuse Neg Hx   . Drug abuse Neg Hx    Social History   Tobacco Use  . Smoking status: Never Smoker  . Smokeless tobacco: Never Used  Vaping Use  . Vaping Use: Never used  Substance Use Topics  . Alcohol use: No    Alcohol/week: 0.0 standard drinks  . Drug use: No    Allergies  Allergen Reactions  . Penicillins Shortness Of Breath, Itching and Rash  . Prednisone Shortness Of Breath, Itching and Rash  . Propoxyphene N-Acetaminophen Itching and Nausea And Vomiting  . Sulfa Antibiotics Itching and Nausea And Vomiting    Current Meds  Medication Sig  . Accu-Chek FastClix Lancets MISC 1 each by Does not apply route 4 (four) times daily. Use to monitor glucose levels 4 times daily; E11.65  . Alcohol Swabs (B-D SINGLE USE SWABS REGULAR) PADS 1 each by Does not apply route 4 (four) times daily. Use to prep site for glucose monitoring 4 times daily; E11.65  . amLODipine (NORVASC) 5 MG tablet TAKE 1 TABLET BY MOUTH EVERY DAY  . aspirin 81 MG tablet Take 81 mg by mouth daily.  Marland Kitchen atorvastatin (LIPITOR) 40 MG tablet TAKE 1 TABLET BY MOUTH EVERY DAY  . benazepril (LOTENSIN) 20 MG tablet TAKE 1 TABLET BY MOUTH EVERY DAY  . Blood Glucose Monitoring Suppl (ACCU-CHEK GUIDE ME) w/Device KIT 1 each by Does not apply route 4 (four) times daily. Use to monitor glucose levels 4 times daily; E11.65  . buPROPion (WELLBUTRIN XL) 150 MG 24 hr tablet Take 1 tablet (150 mg total) by mouth every morning.  . Cholecalciferol 125 MCG (5000 UT) TABS Take 1 tablet by mouth daily.  . Continuous Blood Gluc Sensor (FREESTYLE LIBRE 14 DAY SENSOR) MISC 1 Device by Other route every 14 (fourteen) days.  . diphenhydrAMINE (BENADRYL) 25 MG tablet Take 25 mg by mouth at bedtime.  . donepezil (ARICEPT) 5 MG tablet TAKE 1 TABLET BY MOUTH AT BEDTIME  . gabapentin (NEURONTIN) 400 MG  capsule TAKE ONE CAPSULE BY MOUTH EVERY MORNING and TAKE ONE CAPSULE AT NOON and TAKE TWO CAPSULES AT BEDTIME  . glucose blood (ACCU-CHEK GUIDE) test strip 1 each by Other route 4 (four) times daily.  . insulin NPH-regular Human (NOVOLIN 70/30 RELION) (70-30) 100 UNIT/ML injection 35 units with breakfast, and 15 units with supper.  . Insulin Syringe-Needle U-100 (INSULIN SYRINGE 1CC/31GX5/16") 31G X 5/16" 1 ML MISC 1 each by Does not apply route daily. Use to inject insulin daily; E11.65  .  Multiple Vitamin (MULTIVITAMIN) capsule Take 1 capsule by mouth daily.  Marland Kitchen omeprazole (PRILOSEC) 20 MG capsule TAKE ONE CAPSULE BY MOUTH EVERY DAY  . potassium chloride SA (KLOR-CON) 20 MEQ tablet TAKE 1 TABLET BY MOUTH TWICE DAILY  . sertraline (ZOLOFT) 100 MG tablet Take 1 tablet (100 mg total) by mouth daily.  Marland Kitchen tiZANidine (ZANAFLEX) 2 MG tablet Take 1 tablet (2 mg total) by mouth every 6 (six) hours as needed for muscle spasms.  Marland Kitchen torsemide (DEMADEX) 20 MG tablet TAKE 40 MG EVERY OTHER DAY ALTERNATING 60 MG EVERY OTHER DAY  . UNABLE TO FIND Walker x 1  DX unsteady gait, osteoarthritis of left knee  . UNABLE TO FIND Elevated Toliet Seat x 1 DX: unsteady gait, back pain, osteoarthritis of left knee  . UNABLE TO FIND Standing upright walker x 1  DX M54.40, M19.90  . UNABLE TO FIND Incontinence pads and supplies  . UNABLE TO FIND Rollator Walker  . UNABLE TO FIND XL Tranquility Briefs for incontinence.    Objective: BP (!) 150/74   Pulse 98   Ht 5' 8"  (1.727 m)   Wt 264 lb (119.7 kg)   BMI 40.14 kg/m   Physical Exam:  General: Alert and oriented.  No acute distress.  Seated in wheelchair. Gait: Unable to ambulate.  Evaluation of left knee demonstrates a mild effusion.  She is able to maintain a straight leg raise.  The quadriceps and patellar tendons are both palpable.  Tenderness to palpation along the lateral joint line, as well as the proximal lateral tibial plateau.  No increased laxity to  varus or valgus stress.  Minimal tenderness to palpation along the medial joint line.    IMAGING: I personally ordered and reviewed the following images   X-rays of the left knee were obtained in clinic today and demonstrates severe, bone-on-bone arthritis in the medial, lateral and patellofemoral compartments.  Significant osteophytes are noted within all 3 compartments, particularly off the superior aspect of the patella.  No acute injuries are noted.  No evidence of an occult fracture of the tibial plateau.  Joint line remains intact.  No depression is appreciated.  Impression: Severe left knee arthritis   New Medications:  No orders of the defined types were placed in this encounter.     Mordecai Rasmussen, MD  01/31/2021 3:18 PM

## 2021-01-31 NOTE — Telephone Encounter (Signed)
Informed patient daughter and informed her and she verbalized understanding.

## 2021-02-02 ENCOUNTER — Telehealth (INDEPENDENT_AMBULATORY_CARE_PROVIDER_SITE_OTHER): Payer: Medicare HMO | Admitting: Psychiatry

## 2021-02-02 ENCOUNTER — Telehealth: Payer: Self-pay

## 2021-02-02 ENCOUNTER — Encounter (HOSPITAL_COMMUNITY): Payer: Self-pay | Admitting: Psychiatry

## 2021-02-02 ENCOUNTER — Telehealth (HOSPITAL_COMMUNITY): Payer: Self-pay | Admitting: Psychiatry

## 2021-02-02 ENCOUNTER — Other Ambulatory Visit: Payer: Self-pay

## 2021-02-02 DIAGNOSIS — F329 Major depressive disorder, single episode, unspecified: Secondary | ICD-10-CM | POA: Diagnosis not present

## 2021-02-02 DIAGNOSIS — F32A Depression, unspecified: Secondary | ICD-10-CM

## 2021-02-02 MED ORDER — SERTRALINE HCL 100 MG PO TABS: 100.0000 mg | ORAL_TABLET | Freq: Every day | ORAL | 2 refills | Status: DC

## 2021-02-02 MED ORDER — BUPROPION HCL ER (XL) 150 MG PO TB24: 1.0000 | ORAL_TABLET | Freq: Every morning | ORAL | 2 refills | Status: DC
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
## 2021-02-02 NOTE — Telephone Encounter (Signed)
Pt called voicemail requesting a call about her PT being extended. Please advise.

## 2021-02-02 NOTE — Telephone Encounter (Signed)
Called to schedule f/u appt, spoke with daughter she advised she would have patient return call tomorrow.

## 2021-02-14 ENCOUNTER — Telehealth: Payer: Self-pay

## 2021-02-14 DIAGNOSIS — M1712 Unilateral primary osteoarthritis, left knee: Secondary | ICD-10-CM

## 2021-02-14 NOTE — Telephone Encounter (Signed)
Daughter [name] wants to know if Shiley can be referred back to PT in [place] for her left knee (was not fractured just told it was severe arthritis) PT said all she needed was a new referral and they could get her picked back up

## 2021-02-14 NOTE — Telephone Encounter (Signed)
Daughter aware and referral entered

## 2021-02-14 NOTE — Telephone Encounter (Signed)
Yes please do refer for twice weekly PT in [place] , severe osteoarthritis knee , fdor 6 weeks, let pt/ daughter know, thanks

## 2021-02-14 NOTE — Telephone Encounter (Signed)
See previous message

## 2021-02-15 DIAGNOSIS — H04123 Dry eye syndrome of bilateral lacrimal glands: Secondary | ICD-10-CM | POA: Diagnosis not present

## 2021-02-15 DIAGNOSIS — E113293 Type 2 diabetes mellitus with mild nonproliferative diabetic retinopathy without macular edema, bilateral: Secondary | ICD-10-CM | POA: Diagnosis not present

## 2021-02-15 DIAGNOSIS — Z961 Presence of intraocular lens: Secondary | ICD-10-CM | POA: Diagnosis not present

## 2021-02-15 DIAGNOSIS — H26491 Other secondary cataract, right eye: Secondary | ICD-10-CM | POA: Diagnosis not present

## 2021-02-15 LAB — HM DIABETES EYE EXAM

## 2021-02-21 ENCOUNTER — Other Ambulatory Visit: Payer: Self-pay

## 2021-02-21 ENCOUNTER — Ambulatory Visit: Payer: Medicare HMO | Admitting: Orthopedic Surgery

## 2021-02-21 ENCOUNTER — Encounter: Payer: Self-pay | Admitting: Orthopedic Surgery

## 2021-02-21 ENCOUNTER — Ambulatory Visit: Payer: Medicare HMO

## 2021-02-21 VITALS — BP 164/75 | HR 88 | Ht 68.0 in | Wt 264.0 lb

## 2021-02-21 DIAGNOSIS — M25512 Pain in left shoulder: Secondary | ICD-10-CM

## 2021-02-21 DIAGNOSIS — M7582 Other shoulder lesions, left shoulder: Secondary | ICD-10-CM | POA: Diagnosis not present

## 2021-02-21 DIAGNOSIS — Z0100 Encounter for examination of eyes and vision without abnormal findings: Secondary | ICD-10-CM | POA: Diagnosis not present

## 2021-02-21 NOTE — Progress Notes (Signed)
Orthopaedic Clinic Return  Assessment: Laura Atkins is a 80 y.o. female with the following: 1.  Severe left knee arthritis 2.  Left rotator cuff tendinitis  Plan: Reviewed the findings of the radiographs of both her left knee and her left shoulder.  Outlined multiple options, including continuing with her current regimen of topical medications, as well as ibuprofen.  We can also proceed with a steroid injection if she is interested.  At this time, she is not interested in an injection for her shoulder or her knee.  Recommend continued ambulation is with the assistance of a walker.  I have also advised her to start trying to work on range of motion of the left shoulder, as the shoulder tends to get stiff and this can become more painful.  All questions were answered and she is amenable to this plan.  If she is interested in proceeding with an injection, she should contact the clinic schedule follow-up appointment.  Body mass index is 40.14 kg/m.  Follow-up: Return if symptoms worsen or fail to improve.   Subjective:  Chief Complaint  Patient presents with  . Knee Pain    Lt knee pain after fall    History of Present Illness: Laura Atkins is a 80 y.o. female who returns to clinic for repeat evaluation of her left knee, as well as left shoulder pain.  She had a fall few weeks ago, when I saw her last in clinic, it was not clear if she sustained an acute injury to her left knee, or if this was aggravation of existing severe left knee arthritis.  Since then, she has continued to have severe pain in her left knee.  She has been using topical analgesics, as well as Voltaren gel and ibuprofen.  This improves some of her symptoms, and allow her to get some rest.  She continues to use a walker to assist with ambulation.  After last visit, she has had progressively worsening pain in her left shoulder.  She has difficulty getting her arm above her head.  She is using the same topical treatments  for her left shoulder.  No previous injury.  She has been avoiding range of motion of her left shoulder as this is painful.  Review of Systems: No fevers or chills No numbness or tingling No chest pain No shortness of breath No bowel or bladder dysfunction No GI distress No headaches    Objective: BP (!) 164/75   Pulse 88   Ht '5\' 8"'$  (1.727 m)   Wt 264 lb (119.7 kg)   BMI 40.14 kg/m   Physical Exam:  Alert and oriented.  No acute distress.  Very slow gait, left-sided antalgia with a walker.  Mild effusion of the left knee.  She is able to achieve full extension of the knee.  She is tenderness to palpation of the anterior lateral aspect of her knee.  No increased laxity to varus or valgus stress.  Evaluation of the left shoulder demonstrates no deformity.  No bruising is appreciated.  Active forward flexion to 100 degrees before pain.  Active abduction at her side to 85 degrees before pain.  Passively, she gets to 140 degrees of forward flexion.  50 degrees of external rotation at her side.  Negative belly press.  Diffusely tender to palpation.  Fingers are warm and well-perfused.  IMAGING: I personally ordered and reviewed the following images:   X-ray of the left shoulder demonstrates no acute injuries.  Mild degenerative changes  are noted within the glenohumeral joint.  Small osteophytes are appreciated.  No proximal humeral migration.  Impression: Mild left glenohumeral arthritis.   Mordecai Rasmussen, MD 02/21/2021 1:24 PM

## 2021-02-22 ENCOUNTER — Other Ambulatory Visit: Payer: Self-pay | Admitting: Family Medicine

## 2021-03-08 ENCOUNTER — Ambulatory Visit (HOSPITAL_COMMUNITY): Payer: Medicare HMO | Attending: Family Medicine

## 2021-03-08 ENCOUNTER — Other Ambulatory Visit: Payer: Self-pay

## 2021-03-08 ENCOUNTER — Encounter (HOSPITAL_COMMUNITY): Payer: Self-pay

## 2021-03-08 DIAGNOSIS — R2689 Other abnormalities of gait and mobility: Secondary | ICD-10-CM | POA: Diagnosis not present

## 2021-03-08 DIAGNOSIS — M6281 Muscle weakness (generalized): Secondary | ICD-10-CM | POA: Diagnosis not present

## 2021-03-08 DIAGNOSIS — M24562 Contracture, left knee: Secondary | ICD-10-CM

## 2021-03-08 DIAGNOSIS — M25562 Pain in left knee: Secondary | ICD-10-CM | POA: Insufficient documentation

## 2021-03-08 DIAGNOSIS — G8929 Other chronic pain: Secondary | ICD-10-CM | POA: Diagnosis not present

## 2021-03-08 DIAGNOSIS — R262 Difficulty in walking, not elsewhere classified: Secondary | ICD-10-CM

## 2021-03-08 NOTE — Addendum Note (Signed)
Addended by: Toniann Fail on: 03/08/2021 03:56 PM   Modules accepted: Orders

## 2021-03-08 NOTE — Therapy (Signed)
[place], [place], [address] Phone: [phone]   Fax:  [phone]  Physical Therapy Evaluation  Patient Details  Name: Laura Atkins MRN: [id number] Date of Birth: August 08, 1941 Referring Provider (PT): Tula Nakayama   Encounter Date: 03/08/2021   PT End of Session - 03/08/21 1301     Visit Number 1    Number of Visits 12    Date for PT Re-Evaluation 04/19/21    Authorization Type [employer] HMO    PT Start Time 1300    PT Stop Time [id number]    PT Time Calculation (min) 45 min    Equipment Utilized During Treatment Gait belt    Activity Tolerance Patient tolerated treatment well;Patient limited by fatigue    Behavior During Therapy [facility] for tasks assessed/performed             Past Medical History:  Diagnosis Date   Alpha thalassemia trait 01/26/2010   02/2012: Nl CBC ex H&H-10.7/34.8, MCV-69    Anemia    Anxiety    Anxiety and depression    Cellulitis 05/09/2017   Depression    Diabetes mellitus    Foot pain, right 04/30/2013   GERD (gastroesophageal reflux disease)    Headache(784.0)    Hyperlipidemia    Hypertension    Iron deficiency 01/21/2017   Microcytic anemia 01/26/2010   02/2012: Nl CBC ex H&H-10.7/34.8, MCV-69    NECK PAIN, CHRONIC 10/21/2008   +chronic back pain     Obesity    Obstructive sleep apnea    Osteoarthritis    Left knee; right shoulder; chronic neck and back pain   Pruritus    PVD (peripheral vascular disease) ([place]) 01/28/2014   Seizures (HCC)    Shoulder pain, right 04/14/2015   Tremor    This started months ago after her seizure progressing to very poor hand writing   Urinary incontinence    UTI (urinary tract infection) 01/18/2013    Past Surgical History:  Procedure Laterality Date   ABDOMINAL HYSTERECTOMY     BREAST EXCISIONAL BIOPSY     Left; cyst   CATARACT EXTRACTION Right    12/2017   CATARACT EXTRACTION W/ INTRAOCULAR LENS IMPLANT Left 09/07/2013    CHOLECYSTECTOMY     COLONOSCOPY     COLONOSCOPY N/A 07/20/2015   Procedure: COLONOSCOPY;  Surgeon: Rogene Houston, MD;  Location: [facility];  Service: Endoscopy;  Laterality: N/A;  930   EYE SURGERY Left 09/07/2013   cataract    There were no vitals filed for this visit.    Subjective Assessment - 03/08/21 1309     Subjective Pt had a fall on 01/16/21 and injured her left shoulder, left knee, and suffered scalp laceration. No fractures were caused but notes increase in lateral left knee/patella pain since this incident.  Pt has a ramp to access home but this is being repaired at this time and she has been having to negotiate stairs at this time    Pertinent History HTN, DM, AKI seizure like activity.    Currently in Pain? Yes    Pain Score 8     Pain Location Knee    Pain Orientation Left    Pain Descriptors / Indicators Aching    Pain Type Chronic pain    Pain Onset More than a month ago    Pain Frequency Constant    Aggravating Factors  activity, walking    Pain Relieving Factors rest, medication  [facility] PT Assessment - 03/08/21 0001       Assessment   Medical Diagnosis Primary osteoarthritis of left knee M17.12    Referring Provider (PT) Tula Nakayama    Onset Date/Surgical Date 01/16/21    Prior Therapy previous outpt PT at this facility ending in 11/2020      Precautions   Precautions Fall      Restrictions   Weight Bearing Restrictions No      Balance Screen   Has the patient fallen in the past 6 months Yes    How many times? 1    Has the patient had a decrease in activity level because of a fear of falling?  Yes    Is the patient reluctant to leave their home because of a fear of falling?  Yes      [place] residence    Home Access Ramped entrance    [place] One level    [place] - 2 wheels    Additional Comments assist rails in bathroom      Prior Function   Level of  Independence Independent with basic ADLs;Requires assistive device for independence    Vocation Retired      Charity fundraiser Status Within Functional Limits for tasks assessed      Observation/Other Assessments   Observations left knee valgus deformity    Focus on Therapeutic Outcomes (FOTO)  18% function      ROM / Strength   AROM / PROM / Strength AROM;Strength      AROM   Right Knee Extension 10    Right Knee Flexion 105    Left Knee Extension 15   lacking, knee flexion contracture   Left Knee Flexion 93      Strength   Right Hip Flexion 3+/5    Right Hip ABduction 3+/5    Left Hip Flexion 3/5    Left Hip Extension 2+/5    Left Hip ABduction 3+/5    Right Knee Flexion 4/5    Right Knee Extension 4/5    Left Knee Flexion 3+/5    Left Knee Extension 3+/5      Transfers   Transfers Sit to Stand    Sit to Stand 2: Max assist      Ambulation/Gait   Ambulation/Gait Yes    Ambulation/Gait Assistance 4: Min guard    Ambulation Distance (Feet) 55 Feet    Assistive device Rolling walker    Gait Pattern Step-to pattern    Ambulation Surface Level;Indoor    Pre-Gait Activities pt reports 3-assist for stair negotiation    Gait Comments 2MWT      Standardized Balance Assessment   Standardized Balance Assessment Timed Up and Go Test      Timed Up and Go Test   Normal TUG (seconds) 75                        Objective measurements completed on examination: See above findings.       [facility] Adult PT Treatment/Exercise - 03/08/21 0001       Knee/Hip Exercises: Supine   Quad Sets Strengthening;Left;2 sets;10 reps    Heel Slides AAROM;Left;2 sets;10 reps                    PT Education - 03/08/21 1425     Education Details education on importance of disciplined and regular performance of HEP  to gain max benefit and function    Person(s) Educated Patient;Spouse    Methods Explanation;Demonstration;Handout    Comprehension  Verbalized understanding              PT Short Term Goals - 03/08/21 1433       PT SHORT TERM GOAL #1   Title Patient will independently verbalize and demo initial HEP in order to improve B LE strength with ambulation and transfers.     Time 3    Period Weeks    Status New    Target Date 03/29/21      PT SHORT TERM GOAL #2   Title Patient will demo functional transfers with supervision to reduce burden of care    Baseline max A for sit to stand    Time 3    Period Weeks    Status New    Target Date 03/29/21      PT SHORT TERM GOAL #3   Title PT  will be able to ambulate 156f with a RW in 2 minute time period to demonstrate reduce risk of falling.    Baseline 54 ft w/ CGA    Time 3    Period Weeks    Status New    Target Date 03/29/21      PT SHORT TERM GOAL #4   Title Decrease risk for falls per TUG test time of 30 sec    Baseline 75 sec with RW    Time 3    Period Weeks    Status New    Target Date 03/29/21               PT Long Term Goals - 03/08/21 1525       PT LONG TERM GOAL #1   Title Demo functional transfers with modified independence to reduce caregiver assistance    Baseline max A    Time 6    Period Weeks    Status New    Target Date 04/19/21      PT LONG TERM GOAL #2   Title Decrease risk for falls per time of 15 sec TUG test    Baseline 75 sec with RW    Time 6    Period Weeks    Status New    Target Date 04/19/21      PT LONG TERM GOAL #3   Title Patient will improve FOTO score by at least 10 points in order to indicate improved tolerance to activity.    Baseline 18% function    Time 6    Period Weeks    Status New    Target Date 04/19/21                    Plan - 03/08/21 1426     Clinical Impression Statement Patient is a 80yo lady presenting to physical therapy with c/o left knee pain/weakness. She presents with pain limited deficits in left knee strength, ROM, endurance, postural impairments, and functional  mobility with ADL. She is having to modify and restrict ADL as indicated by FOTO score as well as subjective information and objective measures which is affecting overall participation. Patient will benefit from skilled physical therapy in order to improve function and reduce impairment.    Personal Factors and Comorbidities Fitness;Past/Current Experience;Comorbidity 3+;Time since onset of injury/illness/exacerbation    Comorbidities HTN, seizure like activity, DM, memory issues    Examination-Activity Limitations Caring for Others;Carry;Dressing;Lift;Locomotion Level;Stairs;Stand;Squat;Transfers    Examination-Participation Restrictions  Cleaning;Laundry;Meal Prep;Shop;Church    Stability/Clinical Decision Making Evolving/Moderate complexity    Clinical Decision Making Moderate    Rehab Potential Fair    PT Frequency 2x / week    PT Duration 6 weeks    PT Treatment/Interventions Gait training;Stair training;Functional mobility training;Therapeutic activities;Therapeutic exercise;Balance training;Neuromuscular re-education;Patient/family education;ADLs/Self Care Home Management;DME Instruction;Traction;Manual techniques;Passive range of motion;Dry needling    PT Next Visit Plan HEP development (bed exercises)    PT Home Exercise Plan QS, heel slides    Consulted and Agree with Plan of Care Patient;Family member/caregiver    Family Member Consulted Husband             Patient will benefit from skilled therapeutic intervention in order to improve the following deficits and impairments:  Abnormal gait, Decreased activity tolerance, Decreased balance, Decreased endurance, Decreased mobility, Decreased range of motion, Difficulty walking, Decreased strength, Pain, Decreased knowledge of use of DME, Improper body mechanics, Obesity  Visit Diagnosis: Chronic pain of left knee  Muscle weakness (generalized)  Difficulty in walking, not elsewhere classified  Other abnormalities of gait and  mobility  Flexion contracture of left knee     Problem List Patient Active Problem List   Diagnosis Date Noted   Pressure injury of right heel, unstageable ([place]) 01/06/2021   Polyneuropathy associated with underlying disease ([place]) 01/06/2021   Chronic kidney disease, stage 4 (severe) ([place]) 01/06/2021   Dementia ([place]) 07/13/2020   Cognitive deficits    Carotid bruit 05/26/2020   Hyperlipidemia associated with type 2 diabetes mellitus ([place]) 12/08/2019   Left knee pain 02/11/2017   Iron deficiency 01/21/2017   Vitamin D deficiency 10/15/2016   Urinary incontinence 10/15/2016   Limited mobility 05/27/2016   Type 2 diabetes mellitus with hyperglycemia ([place]) 02/04/2016   Sleep terror disorder 12/14/2015   Fecal incontinence 12/13/2015   Back pain of lumbar region with sciatica 04/15/2015   Spondylosis, cervical, with myelopathy 04/14/2015   OSA (obstructive sleep apnea) 04/10/2013   Peripheral neuropathy 03/12/2013   Hearing loss 11/06/2012   Seizure-like activity ([place]) 01/25/2012   UNSTEADY GAIT 11/07/2010   Alpha thalassemia ([place]) 01/26/2010   Morbid obesity ([place]) 04/21/2008   Anxiety and depression 04/21/2008   Essential hypertension 04/21/2008   GERD 04/21/2008   Osteoarthritis of left knee 04/21/2008   3:37 PM, 03/08/21 M. Sherlyn Lees, PT, DPT Physical Therapist- [place] Office Number: [phone]   [facility] [address] [place], [place], [address] Phone: [phone]   Fax:  [phone]  Name: Laura Atkins MRN: [id number] Date of Birth: [birth year]

## 2021-03-15 ENCOUNTER — Encounter (HOSPITAL_COMMUNITY): Payer: Self-pay

## 2021-03-15 ENCOUNTER — Ambulatory Visit (INDEPENDENT_AMBULATORY_CARE_PROVIDER_SITE_OTHER): Payer: Medicare HMO

## 2021-03-15 ENCOUNTER — Ambulatory Visit: Payer: Medicare HMO

## 2021-03-15 ENCOUNTER — Other Ambulatory Visit: Payer: Self-pay

## 2021-03-15 ENCOUNTER — Ambulatory Visit (HOSPITAL_COMMUNITY): Payer: Medicare HMO

## 2021-03-15 DIAGNOSIS — Z0000 Encounter for general adult medical examination without abnormal findings: Secondary | ICD-10-CM

## 2021-03-15 DIAGNOSIS — M6281 Muscle weakness (generalized): Secondary | ICD-10-CM

## 2021-03-15 DIAGNOSIS — R262 Difficulty in walking, not elsewhere classified: Secondary | ICD-10-CM

## 2021-03-15 DIAGNOSIS — R2689 Other abnormalities of gait and mobility: Secondary | ICD-10-CM | POA: Diagnosis not present

## 2021-03-15 DIAGNOSIS — G8929 Other chronic pain: Secondary | ICD-10-CM | POA: Diagnosis not present

## 2021-03-15 DIAGNOSIS — Z780 Asymptomatic menopausal state: Secondary | ICD-10-CM

## 2021-03-15 DIAGNOSIS — M24562 Contracture, left knee: Secondary | ICD-10-CM | POA: Diagnosis not present

## 2021-03-15 DIAGNOSIS — M25562 Pain in left knee: Secondary | ICD-10-CM

## 2021-03-15 NOTE — Patient Instructions (Signed)
Laura Atkins , Thank you for taking time to come for your Medicare Wellness Visit. I appreciate your ongoing commitment to your health goals. Please review the following plan we discussed and let me know if I can assist you in the future.   Screening recommendations/referrals: Colonoscopy: No longer required  Mammogram: Currently due, please call [facility] and get scheduled  Bone Density: Currently due orders placed this visit. You may get scheduled with your mammogram Recommended yearly ophthalmology/optometry visit for glaucoma screening and checkup Recommended yearly dental visit for hygiene and checkup  Vaccinations: Influenza vaccine: Up to date, next due fall 2022  Pneumococcal vaccine: Completed series  Tdap vaccine: Up to date, next due 07/17/2028 Shingles vaccine: Currently due for Shingrix, if you would like to receive, recommend that you do so at the pharmacy as it is less expensive     Advanced directives: Advance directive discussed with you today. Even though you declined this today please call our office should you change your mind and we can give you the proper paperwork for you to fill out.   Conditions/risks identified: None   Next appointment: None    Preventive Care 65 Years and Older, Female Preventive care refers to lifestyle choices and visits with your health care provider that can promote health and wellness. What does preventive care include? A yearly physical exam. This is also called an annual well check. Dental exams once or twice a year. Routine eye exams. Ask your health care provider how often you should have your eyes checked. Personal lifestyle choices, including: Daily care of your teeth and gums. Regular physical activity. Eating a healthy diet. Avoiding tobacco and drug use. Limiting alcohol use. Practicing safe sex. Taking low-dose aspirin every day. Taking vitamin and mineral supplements as recommended by your health care provider. What  happens during an annual well check? The services and screenings done by your health care provider during your annual well check will depend on your age, overall health, lifestyle risk factors, and family history of disease. Counseling  Your health care provider may ask you questions about your: Alcohol use. Tobacco use. Drug use. Emotional well-being. Home and relationship well-being. Sexual activity. Eating habits. History of falls. Memory and ability to understand (cognition). Work and work Statistician. Reproductive health. Screening  You may have the following tests or measurements: Height, weight, and BMI. Blood pressure. Lipid and cholesterol levels. These may be checked every 5 years, or more frequently if you are over 64 years old. Skin check. Lung cancer screening. You may have this screening every year starting at age 30 if you have a 30-pack-year history of smoking and currently smoke or have quit within the past 15 years. Fecal occult blood test (FOBT) of the stool. You may have this test every year starting at age 19. Flexible sigmoidoscopy or colonoscopy. You may have a sigmoidoscopy every 5 years or a colonoscopy every 10 years starting at age 48. Hepatitis C blood test. Hepatitis B blood test. Sexually transmitted disease (STD) testing. Diabetes screening. This is done by checking your blood sugar (glucose) after you have not eaten for a while (fasting). You may have this done every 1-3 years. Bone density scan. This is done to screen for osteoporosis. You may have this done starting at age 37. Mammogram. This may be done every 1-2 years. Talk to your health care provider about how often you should have regular mammograms. Talk with your health care provider about your test results, treatment options, and if  necessary, the need for more tests. Vaccines  Your health care provider may recommend certain vaccines, such as: Influenza vaccine. This is recommended every  year. Tetanus, diphtheria, and acellular pertussis (Tdap, Td) vaccine. You may need a Td booster every 10 years. Zoster vaccine. You may need this after age 35. Pneumococcal 13-valent conjugate (PCV13) vaccine. One dose is recommended after age 40. Pneumococcal polysaccharide (PPSV23) vaccine. One dose is recommended after age 32. Talk to your health care provider about which screenings and vaccines you need and how often you need them. This information is not intended to replace advice given to you by your health care provider. Make sure you discuss any questions you have with your health care provider. Document Released: 10/07/2015 Document Revised: 05/30/2016 Document Reviewed: 07/12/2015 Elsevier Interactive Patient Education  2017 [place] Prevention in the Home Falls can cause injuries. They can happen to people of all ages. There are many things you can do to make your home safe and to help prevent falls. What can I do on the outside of my home? Regularly fix the edges of walkways and driveways and fix any cracks. Remove anything that might make you trip as you walk through a door, such as a raised step or threshold. Trim any bushes or trees on the path to your home. Use bright outdoor lighting. Clear any walking paths of anything that might make someone trip, such as rocks or tools. Regularly check to see if handrails are loose or broken. Make sure that both sides of any steps have handrails. Any raised decks and porches should have guardrails on the edges. Have any leaves, snow, or ice cleared regularly. Use sand or salt on walking paths during winter. Clean up any spills in your garage right away. This includes oil or grease spills. What can I do in the bathroom? Use night lights. Install grab bars by the toilet and in the tub and shower. Do not use towel bars as grab bars. Use non-skid mats or decals in the tub or shower. If you need to sit down in the shower, use a  plastic, non-slip stool. Keep the floor dry. Clean up any water that spills on the floor as soon as it happens. Remove soap buildup in the tub or shower regularly. Attach bath mats securely with double-sided non-slip rug tape. Do not have throw rugs and other things on the floor that can make you trip. What can I do in the bedroom? Use night lights. Make sure that you have a light by your bed that is easy to reach. Do not use any sheets or blankets that are too big for your bed. They should not hang down onto the floor. Have a firm chair that has side arms. You can use this for support while you get dressed. Do not have throw rugs and other things on the floor that can make you trip. What can I do in the kitchen? Clean up any spills right away. Avoid walking on wet floors. Keep items that you use a lot in easy-to-reach places. If you need to reach something above you, use a strong step stool that has a grab bar. Keep electrical cords out of the way. Do not use floor polish or wax that makes floors slippery. If you must use wax, use non-skid floor wax. Do not have throw rugs and other things on the floor that can make you trip. What can I do with my stairs? Do not leave any items  on the stairs. Make sure that there are handrails on both sides of the stairs and use them. Fix handrails that are broken or loose. Make sure that handrails are as long as the stairways. Check any carpeting to make sure that it is firmly attached to the stairs. Fix any carpet that is loose or worn. Avoid having throw rugs at the top or bottom of the stairs. If you do have throw rugs, attach them to the floor with carpet tape. Make sure that you have a light switch at the top of the stairs and the bottom of the stairs. If you do not have them, ask someone to add them for you. What else can I do to help prevent falls? Wear shoes that: Do not have high heels. Have rubber bottoms. Are comfortable and fit you  well. Are closed at the toe. Do not wear sandals. If you use a stepladder: Make sure that it is fully opened. Do not climb a closed stepladder. Make sure that both sides of the stepladder are locked into place. Ask someone to hold it for you, if possible. Clearly mark and make sure that you can see: Any grab bars or handrails. First and last steps. Where the edge of each step is. Use tools that help you move around (mobility aids) if they are needed. These include: Canes. Walkers. Scooters. Crutches. Turn on the lights when you go into a dark area. Replace any light bulbs as soon as they burn out. Set up your furniture so you have a clear path. Avoid moving your furniture around. If any of your floors are uneven, fix them. If there are any pets around you, be aware of where they are. Review your medicines with your doctor. Some medicines can make you feel dizzy. This can increase your chance of falling. Ask your doctor what other things that you can do to help prevent falls. This information is not intended to replace advice given to you by your health care provider. Make sure you discuss any questions you have with your health care provider. Document Released: 07/07/2009 Document Revised: 02/16/2016 Document Reviewed: 10/15/2014 Elsevier Interactive Patient Education  2017 [employer].

## 2021-03-15 NOTE — Progress Notes (Signed)
Subjective:   Laura Atkins is a 80 y.o. female who presents for Medicare Annual (Subsequent) preventive examination.  I connected with Oswaldo Milian  today by telephone and verified that I am speaking with the correct person using two identifiers. Location patient: home Location provider: work Persons participating in the virtual visit: patient, provider.   I discussed the limitations, risks, security and privacy concerns of performing an evaluation and management service by telephone and the availability of in person appointments. I also discussed with the patient that there may be a patient responsible charge related to this service. The patient expressed understanding and verbally consented to this telephonic visit.    Interactive audio and video telecommunications were attempted between this provider and patient, however failed, due to patient having technical difficulties OR patient did not have access to video capability.  We continued and completed visit with audio only.     Review of Systems    N/A  Cardiac Risk Factors include: advanced age (>45mn, >>18women);hypertension;diabetes mellitus;dyslipidemia     Objective:    Today's Vitals   03/15/21 1451  PainSc: 7    There is no height or weight on file to calculate BMI.  Advanced Directives 03/15/2021 09/29/2020 05/27/2020 05/27/2020 02/24/2018 08/19/2017 05/08/2017  Does Patient Have a Medical Advance Directive? _0  No No  Type of Advance Directive - - - - - - -  Copy of [place] Chart? - - - - - - -  Would patient like information on creating a medical advance directive? No - Patient declined No - Patient declined No - Patient declined No - Patient declined Yes (MAU/Ambulatory/Procedural Areas - Information given) No - Patient declined -  Pre-existing out of facility DNR order (yellow form or pink MOST form) - - - - - - -  Some encounter information is confidential and restricted. Go to  Review Flowsheets activity to see all data.    Current Medications (verified) Outpatient Encounter Medications as of 03/15/2021  Medication Sig   Accu-Chek FastClix Lancets MISC 1 each by Does not apply route 4 (four) times daily. Use to monitor glucose levels 4 times daily; E11.65   Alcohol Swabs (B-D SINGLE USE SWABS REGULAR) PADS 1 each by Does not apply route 4 (four) times daily. Use to prep site for glucose monitoring 4 times daily; E11.65   amLODipine (NORVASC) 5 MG tablet TAKE 1 TABLET BY MOUTH EVERY DAY   aspirin 81 MG tablet Take 81 mg by mouth daily.   atorvastatin (LIPITOR) 40 MG tablet TAKE 1 TABLET BY MOUTH EVERY DAY   benazepril (LOTENSIN) 20 MG tablet TAKE 1 TABLET BY MOUTH EVERY DAY   Blood Glucose Monitoring Suppl (ACCU-CHEK GUIDE ME) w/Device KIT 1 each by Does not apply route 4 (four) times daily. Use to monitor glucose levels 4 times daily; E11.65   buPROPion (WELLBUTRIN XL) 150 MG 24 hr tablet Take 1 tablet (150 mg total) by mouth every morning.   Cholecalciferol 125 MCG (5000 UT) TABS Take 1 tablet by mouth daily.   Continuous Blood Gluc Sensor (FREESTYLE LIBRE 14 DAY SENSOR) MISC 1 Device by Other route every 14 (fourteen) days.   diphenhydrAMINE (BENADRYL) 25 MG tablet Take 25 mg by mouth at bedtime.   donepezil (ARICEPT) 5 MG tablet TAKE 1 TABLET BY MOUTH AT BEDTIME   gabapentin (NEURONTIN) 400 MG capsule TAKE ONE CAPSULE BY MOUTH EVERY MORNING and TAKE ONE CAPSULE AT NOON and TAKE TWO  CAPSULES AT BEDTIME   glucose blood (ACCU-CHEK GUIDE) test strip 1 each by Other route 4 (four) times daily.   insulin NPH-regular Human (NOVOLIN 70/30 RELION) (70-30) 100 UNIT/ML injection 35 units with breakfast, and 15 units with supper.   Insulin Syringe-Needle U-100 (INSULIN SYRINGE 1CC/31GX5/16") 31G X 5/16" 1 ML MISC 1 each by Does not apply route daily. Use to inject insulin daily; E11.65   Multiple Vitamin (MULTIVITAMIN) capsule Take 1 capsule by mouth daily.   omeprazole  (PRILOSEC) 20 MG capsule TAKE ONE CAPSULE BY MOUTH EVERY DAY   potassium chloride SA (KLOR-CON) 20 MEQ tablet TAKE 1 TABLET BY MOUTH TWICE DAILY   sertraline (ZOLOFT) 100 MG tablet Take 1 tablet (100 mg total) by mouth daily.   tiZANidine (ZANAFLEX) 2 MG tablet Take 1 tablet (2 mg total) by mouth every 6 (six) hours as needed for muscle spasms.   torsemide (DEMADEX) 20 MG tablet TAKE 40 MG EVERY OTHER DAY ALTERNATING 60 MG EVERY OTHER DAY   UNABLE TO FIND Walker x 1  DX unsteady gait, osteoarthritis of left knee   UNABLE TO FIND Elevated Toliet Seat x 1 DX: unsteady gait, back pain, osteoarthritis of left knee   UNABLE TO FIND Standing upright walker x 1  DX M54.40, M19.90   UNABLE TO FIND Incontinence pads and supplies   UNABLE TO FIND Rollator Walker   UNABLE TO FIND XL Tranquility Briefs for incontinence.   No facility-administered encounter medications on file as of 03/15/2021.    Allergies (verified) Penicillins, Prednisone, Propoxyphene n-acetaminophen, and Sulfa antibiotics   History: Past Medical History:  Diagnosis Date   Alpha thalassemia trait 01/26/2010   02/2012: Nl CBC ex H&H-10.7/34.8, MCV-69    Anemia    Anxiety    Anxiety and depression    Cellulitis 05/09/2017   Depression    Diabetes mellitus    Foot pain, right 04/30/2013   GERD (gastroesophageal reflux disease)    Headache(784.0)    Hyperlipidemia    Hypertension    Iron deficiency 01/21/2017   Microcytic anemia 01/26/2010   02/2012: Nl CBC ex H&H-10.7/34.8, MCV-69    NECK PAIN, CHRONIC 10/21/2008   +chronic back pain     Obesity    Obstructive sleep apnea    Osteoarthritis    Left knee; right shoulder; chronic neck and back pain   Pruritus    PVD (peripheral vascular disease) ([place]) 01/28/2014   Seizures (HCC)    Shoulder pain, right 04/14/2015   Tremor    This started months ago after her seizure progressing to very poor hand writing   Urinary incontinence    UTI (urinary tract infection) 01/18/2013   Past  Surgical History:  Procedure Laterality Date   ABDOMINAL HYSTERECTOMY     BREAST EXCISIONAL BIOPSY     Left; cyst   CATARACT EXTRACTION Right    12/2017   CATARACT EXTRACTION W/ INTRAOCULAR LENS IMPLANT Left 09/07/2013   CHOLECYSTECTOMY     COLONOSCOPY     COLONOSCOPY N/A 07/20/2015   Procedure: COLONOSCOPY;  Surgeon: Rogene Houston, MD;  Location: [facility];  Service: Endoscopy;  Laterality: N/A;  930   EYE SURGERY Left 09/07/2013   cataract   Family History  Problem Relation Age of Onset   Lung cancer Mother 80   Kidney disease Father    Diabetes Sister    Keloids Brother    ADD / ADHD Grandchild    Bipolar disorder Grandchild    Bipolar disorder Daughter  Seizures Daughter    Heart disease Daughter    Kidney disease Son    Neuropathy Son    Kidney disease Son    Edema Daughter    Breast cancer Daughter 62   Allergies Daughter    Alcohol abuse Neg Hx    Drug abuse Neg Hx    Social History   Socioeconomic History   Marital status: Married    Spouse name: saunders   Number of children: 5   Years of education: 12   Highest education level: Not on file  Occupational History   Occupation: Disabled     Employer: RETIRED  Tobacco Use   Smoking status: Never   Smokeless tobacco: Never  Vaping Use   Vaping Use: Never used  Substance and Sexual Activity   Alcohol use: No    Alcohol/week: 0.0 standard drinks   Drug use: No   Sexual activity: Yes    Birth control/protection: Surgical  Other Topics Concern   Not on file  Social History Narrative   07/27/20 Patient lives at home with her husband [name] and dgtr- [name].  Patient is retired.    Right handed.    Five Children.    Caffeine- 2 daily   Social Determinants of Health   Financial Resource Strain: Not on file  Food Insecurity: No Food Insecurity   Worried About Charity fundraiser in the Last Year: Never true   Ran Out of Food in the Last Year: Never true  Transportation Needs: No  Transportation Needs   Lack of Transportation (Medical): No   Lack of Transportation (Non-Medical): No  Physical Activity: Inactive   Days of Exercise per Week: 0 days   Minutes of Exercise per Session: 0 min  Stress: No Stress Concern Present   Feeling of Stress : Not at all  Social Connections: Moderately Integrated   Frequency of Communication with Friends and Family: More than three times a week   Frequency of Social Gatherings with Friends and Family: More than three times a week   Attends Religious Services: More than 4 times per year   Active Member of [employer] or Organizations: No   Attends Music therapist: Never   Marital Status: Married    Tobacco Counseling Counseling given: Not Answered   Clinical Intake:  Pre-visit preparation completed: Yes  Pain : 0-10 Pain Score: 7  Pain Type: Acute pain Pain Location: Back (shoulders, head) Pain Descriptors / Indicators: Aching Pain Onset: 1 to 4 weeks ago Pain Frequency: Constant Pain Relieving Factors: PT  Pain Relieving Factors: PT  Nutritional Risks: None Diabetes: Yes CBG done?: No Did pt. bring in CBG monitor from home?: No  How often do you need to have someone help you when you read instructions, pamphlets, or other written materials from your doctor or pharmacy?: 1 - Never  Diabetic?yes Nutrition Risk Assessment:  Has the patient had any N/V/D within the last 2 months?  No  Does the patient have any non-healing wounds?  No  Has the patient had any unintentional weight loss or weight gain?  No   Diabetes:  Is the patient diabetic?  Yes  If diabetic, was a CBG obtained today?  No  Did the patient bring in their glucometer from home?  No  How often do you monitor your CBG's? States checks glucose daily .   Financial Strains and Diabetes Management:  Are you having any financial strains with the device, your supplies or your medication? No .  Does the patient want to be seen by Chronic Care  Management for management of their diabetes?  No  Would the patient like to be referred to a Nutritionist or for Diabetic Management?  No   Diabetic Exams:  Diabetic Eye Exam: Completed 02/15/2021 Diabetic Foot Exam: Completed 11/17/2020   Interpreter Needed?: No  Information entered by :: [place] of Daily Living In your present state of health, do you have any difficulty performing the following activities: 03/15/2021  Hearing? Y  Vision? N  Difficulty concentrating or making decisions? N  Walking or climbing stairs? Y  Comment has balance issues does not climb stairs  Dressing or bathing? N  Doing errands, shopping? Y  Preparing Food and eating ? N  Using the Toilet? N  In the past six months, have you accidently leaked urine? Y  Do you have problems with loss of bowel control? Y  Managing your Medications? Y  Managing your Finances? N  Housekeeping or managing your Housekeeping? N  Some recent data might be hidden    Patient Care Team: Fayrene Helper, MD as PCP - General Branch, Alphonse Guild, MD as PCP - Cardiology (Cardiology) Marcial Pacas, MD as Consulting Physician (Neurology) Renato Shin, MD as Consulting Physician (Endocrinology) Cloria Spring, MD as Consulting Physician ([place]) Alonza Smoker, LCSW as Social Worker (Psychiatry) Clent Jacks, MD as Consulting Physician (Ophthalmology)  Indicate any recent [place] you may have received from other than [facility] providers in the past year (date may be approximate).     Assessment:   This is a routine wellness examination for Monae.  Hearing/Vision screen Vision Screening - Comments:: Patient states gets eyes examined once per year. Wears glasses   Dietary issues and exercise activities discussed: Current Exercise Habits: The patient does not participate in regular exercise at present, Exercise limited by: None identified   Goals Addressed             This Visit's  Progress    Prevent falls         Depression Screen PHQ 2/9 Scores 03/15/2021 01/26/2021 12/26/2020 11/08/2020 10/18/2020 09/27/2020 09/07/2020  PHQ - 2 Score 0 0 0 _0 0  PHQ- 9 Score 0 _1 - - -  Exception Documentation - - - - - - -  Some encounter information is confidential and restricted. Go to Review Flowsheets activity to see all data.    Fall Risk Fall Risk  03/15/2021 01/26/2021 12/26/2020 11/08/2020 10/18/2020  Falls in the past year? 1 1 0 0 0  Number falls in past yr: 1 1 0 0 0  Injury with Fall? 1 1 0 0 0  Risk Factor Category  - - - - -  Risk for fall due to : History of fall(s);Impaired balance/gait Impaired balance/gait No Fall Risks No Fall Risks No Fall Risks  Follow up Falls evaluation completed;Falls prevention discussed Falls evaluation completed Falls evaluation completed Falls evaluation completed Falls evaluation completed  Comment - - - - -    FALL RISK PREVENTION PERTAINING TO THE HOME:  Any stairs in or around the home? No  If so, are there any without handrails? No  Home free of loose throw rugs in walkways, pet beds, electrical cords, etc? Yes  Adequate lighting in your home to reduce risk of falls? Yes   ASSISTIVE DEVICES UTILIZED TO PREVENT FALLS:  Life alert? No  Use of a cane, walker or w/c? Yes  Grab bars  in the bathroom? Yes  Shower chair or bench in shower? No  Elevated toilet seat or a handicapped toilet? Yes    Cognitive Function:  Normal cognitive status assessed by direct observation by this Nurse Health Advisor. No abnormalities found.   MMSE - Mini Mental State Exam 07/27/2020 07/13/2020  Orientation to time 3 4  Orientation to Place 4 5  Registration 3 3  Attention/ Calculation 2 5  Recall 0 0  Language- name 2 objects 2 2  Language- repeat 0 1  Language- follow 3 step command 3 2  Language- read & follow direction 1 1  Write a sentence 1 0  Copy design 1 0  Copy design-comments hand shakes -  Total score 20 23     6CIT Screen  02/26/2019 02/24/2018 02/11/2017  What Year? 0 points 0 points 0 points  What month? 0 points 0 points 0 points  What time? 0 points 0 points 0 points  Count back from 20 0 points 0 points 0 points  Months in reverse 0 points 0 points 0 points  Repeat phrase 2 points 2 points 0 points  Total Score 2 2 0    Immunizations Immunization History  Administered Date(s) Administered   Fluad Quad(high Dose 65+) 05/20/2019   H1N1 07/15/2008   Influenza Split 06/17/2012   Influenza Whole 07/03/2007, 06/17/2009, 06/07/2011   Influenza, High Dose Seasonal PF 07/17/2018   Influenza,inj,Quad PF,6+ Mos 07/13/2013, 09/06/2014, 08/10/2015, 05/23/2016, 05/22/2017   Pneumococcal Conjugate-13 05/03/2014   Pneumococcal Polysaccharide-23 09/08/2010   Td 09/08/2010   Tdap 07/17/2018   Unspecified SARS-COV-2 Vaccination 09/25/2019, 10/26/2019    TDAP status: Up to date  Flu Vaccine status: Up to date  Pneumococcal vaccine status: Up to date  Covid-19 vaccine status: Completed vaccines  Qualifies for Shingles Vaccine? Yes   Zostavax completed No   Shingrix Completed?: No.    Education has been provided regarding the importance of this vaccine. Patient has been advised to call insurance company to determine out of pocket expense if they have not yet received this vaccine. Advised may also receive vaccine at local pharmacy or Health Dept. Verbalized acceptance and understanding.  Screening Tests Health Maintenance  Topic Date Due   Hepatitis C Screening  Never done   Zoster Vaccines- Shingrix (1 of 2) Never done   COVID-19 Vaccine (3 - Booster) 03/24/2020   INFLUENZA VACCINE  04/24/2021   HEMOGLOBIN A1C  05/17/2021   FOOT EXAM  11/17/2021   OPHTHALMOLOGY EXAM  02/15/2022   TETANUS/TDAP  07/17/2028   DEXA SCAN  Completed   PNA vac Low Risk Adult  Completed   HPV VACCINES  Aged Out    Health Maintenance  Health Maintenance Due  Topic Date Due   Hepatitis C Screening  Never done   Zoster  Vaccines- Shingrix (1 of 2) Never done   COVID-19 Vaccine (3 - Booster) 03/24/2020    Colorectal cancer screening: No longer required.   Mammogram status: Completed 12/26/2020. Repeat every year  Bone Density status: Ordered 03/15/2021. Pt provided with contact info and advised to call to schedule appt.  Lung Cancer Screening: (Low Dose CT Chest recommended if Age 34-80 years, 30 pack-year currently smoking OR have quit w/in 15years.) does not qualify.   Lung Cancer Screening Referral: N/A   Additional Screening:  Hepatitis C Screening: does qualify;   Vision Screening: Recommended annual ophthalmology exams for early detection of glaucoma and other disorders of the eye. Is the patient up to date with  their annual eye exam?  Yes  Who is the provider or what is the name of the office in which the patient attends annual eye exams? Dr. Katy Fitch  If pt is not established with a provider, would they like to be referred to a provider to establish care? No .   Dental Screening: Recommended annual dental exams for proper oral hygiene  Community Resource Referral / Chronic Care Management: CRR required this visit?  No   CCM required this visit?  No      Plan:     I have personally reviewed and noted the following in the patient's chart:   Medical and social history Use of alcohol, tobacco or illicit drugs  Current medications and supplements including opioid prescriptions.  Functional ability and status Nutritional status Physical activity Advanced directives List of other physicians Hospitalizations, surgeries, and ER visits in previous 12 months Vitals Screenings to include cognitive, depression, and falls Referrals and appointments  In addition, I have reviewed and discussed with patient certain preventive protocols, quality metrics, and best practice recommendations. A written personalized care plan for preventive services as well as general preventive health recommendations  were provided to patient.     Ofilia Neas, LPN   10/18/869   Nurse Notes: None

## 2021-03-15 NOTE — Therapy (Signed)
[facility], [place], [address] Phone: [phone]   Fax:  [phone]  Physical Therapy Treatment  Patient Details  Name: Laura Atkins MRN: [id number] Date of Birth: [birth year] Referring Provider (PT): Tula Nakayama   Encounter Date: 03/15/2021   PT End of Session - 03/15/21 1000     Visit Number 2    Number of Visits 12    Date for PT Re-Evaluation 04/19/21    Authorization Type [employer] H[employer]    Progress Note Due on Visit 10    PT Start Time [phone]    PT Stop Time 1032    PT Time Calculation (min) 40 min    Equipment Utilized During Treatment Gait belt    Activity Tolerance Patient tolerated treatment well;Patient limited by fatigue    Behavior During Therapy [facility] for tasks assessed/performed             Past Medical History:  Diagnosis Date   Alpha thalassemia trait 01/26/2010   02/2012: Nl CBC ex H&H-10.7/34.8, MCV-69    Anemia    Anxiety    Anxiety and depression    Cellulitis 05/09/2017   Depression    Diabetes mellitus    Foot pain, right 04/30/2013   GERD (gastroesophageal reflux disease)    Headache(784.0)    Hyperlipidemia    Hypertension    Iron deficiency 01/21/2017   Microcytic anemia 01/26/2010   02/2012: Nl CBC ex H&H-10.7/34.8, MCV-69    NECK PAIN, CHRONIC 10/21/2008   +chronic back pain     Obesity    Obstructive sleep apnea    Osteoarthritis    Left knee; right shoulder; chronic neck and back pain   Pruritus    PVD (peripheral vascular disease) ([place]) 01/28/2014   Seizures (HCC)    Shoulder pain, right 04/14/2015   Tremor    This started months ago after her seizure progressing to very poor hand writing   Urinary incontinence    UTI (urinary tract infection) 01/18/2013    Past Surgical History:  Procedure Laterality Date   ABDOMINAL HYSTERECTOMY     BREAST EXCISIONAL BIOPSY     Left; cyst   CATARACT EXTRACTION Right    12/2017   CATARACT EXTRACTION W/ INTRAOCULAR LENS  IMPLANT Left 09/07/2013   CHOLECYSTECTOMY     COLONOSCOPY     COLONOSCOPY N/A 07/20/2015   Procedure: COLONOSCOPY;  Surgeon: Rogene Houston, MD;  [facility];  Service: Endoscopy;  Laterality: N/A;  930   EYE SURGERY Left 09/07/2013   cataract    There were no vitals filed for this visit.   Subjective Assessment - 03/15/21 1000     Subjective "Still having trouble with my left knee and left shoulder"    Pertinent History HTN, DM, AKI seizure like activity.    Pain Onset More than a month ago                [facility] PT Assessment - 03/15/21 0001       Assessment   Medical Diagnosis Primary osteoarthritis of left knee M17.12    Referring Provider (PT) Tula Nakayama    Onset Date/Surgical Date 01/16/21                           [facility] Adult PT Treatment/Exercise - 03/15/21 0001       Transfers   Transfers Sit to Stand    Sit to Stand  2: Max assist      Ambulation/Gait   Ambulation/Gait Yes    Ambulation/Gait Assistance 4: Min guard    Ambulation Distance (Feet) 75 Feet    Assistive device Rolling walker    Gait Pattern Step-to pattern    Ambulation Surface Level;Indoor      Knee/Hip Exercises: Seated   Long Arc Quad Strengthening;Both;2 sets;10 reps    Other Seated Knee/Hip Exercises ankle PF,DF, ball squeeze    Sit to Sand 3 sets;5 reps;with UE support   from 24" height 1x5, then from 19" seat height     Manual Therapy   Manual Therapy Joint mobilization    Manual therapy comments seperate from all aspects to decrease pain.    Joint Mobilization posterior glides to tib-femoral grade 3 to increase extension ROM                    PT Education - 03/15/21 1045     Education Details education on body mechanics to improve trunk flexion over BOS to increase independence with sit to stand    Person(s) Educated Patient;Spouse    Methods Explanation;Demonstration    Comprehension Verbalized understanding;Returned  demonstration              PT Short Term Goals - 03/08/21 1433       PT SHORT TERM GOAL #1   Title Patient will independently verbalize and demo initial HEP in order to improve B LE strength with ambulation and transfers.     Time 3    Period Weeks    Status New    Target Date 03/29/21      PT SHORT TERM GOAL #2   Title Patient will demo functional transfers with supervision to reduce burden of care    Baseline max A for sit to stand    Time 3    Period Weeks    Status New    Target Date 03/29/21      PT SHORT TERM GOAL #3   Title PT  will be able to ambulate 154f with a RW in 2 minute time period to demonstrate reduce risk of falling.    Baseline 54 ft w/ CGA    Time 3    Period Weeks    Status New    Target Date 03/29/21      PT SHORT TERM GOAL #4   Title Decrease risk for falls per TUG test time of 30 sec    Baseline 75 sec with RW    Time 3    Period Weeks    Status New    Target Date 03/29/21               PT Long Term Goals - 03/08/21 1525       PT LONG TERM GOAL #1   Title Demo functional transfers with modified independence to reduce caregiver assistance    Baseline max A    Time 6    Period Weeks    Status New    Target Date 04/19/21      PT LONG TERM GOAL #2   Title Decrease risk for falls per time of 15 sec TUG test    Baseline 75 sec with RW    Time 6    Period Weeks    Status New    Target Date 04/19/21      PT LONG TERM GOAL #3   Title Patient will improve FOTO score by at least 10 points  in order to indicate improved tolerance to activity.    Baseline 18% function    Time 6    Period Weeks    Status New    Target Date 04/19/21                   Plan - 03/15/21 1046     Clinical Impression Statement Demonstrates improved carryover with sit to stand following session requiring mod A with improved carryover of trunk flexino over BOS. COntinued sessions indicated to progress LE strength and functional indepedence     Personal Factors and Comorbidities Fitness;Past/Current Experience;Comorbidity 3+;Time since onset of injury/illness/exacerbation    Comorbidities HTN, seizure like activity, DM, memory issues    Examination-Activity Limitations Caring for Others;Carry;Dressing;Lift;Locomotion Level;Stairs;Stand;Squat;Transfers    Examination-Participation Restrictions Cleaning;Laundry;Meal Prep;Shop;Church    Stability/Clinical Decision Making Evolving/Moderate complexity    Rehab Potential Fair    PT Frequency 2x / week    PT Duration 6 weeks    PT Treatment/Interventions Gait training;Stair training;Functional mobility training;Therapeutic activities;Therapeutic exercise;Balance training;Neuromuscular re-education;Patient/family education;ADLs/Self Care Home Management;DME Instruction;Traction;Manual techniques;Passive range of motion;Dry needling    PT Next Visit Plan HEP development (bed exercises)    PT Home Exercise Plan QS, heel slides    Consulted and Agree with Plan of Care Patient;Family member/caregiver    Family Member Consulted Husband             Patient will benefit from skilled therapeutic intervention in order to improve the following deficits and impairments:  Abnormal gait, Decreased activity tolerance, Decreased balance, Decreased endurance, Decreased mobility, Decreased range of motion, Difficulty walking, Decreased strength, Pain, Decreased knowledge of use of DME, Improper body mechanics, Obesity  Visit Diagnosis: Chronic pain of left knee  Muscle weakness (generalized)  Difficulty in walking, not elsewhere classified  Other abnormalities of gait and mobility     Problem List Patient Active Problem List   Diagnosis Date Noted   Pressure injury of right heel, unstageable ([place]) 01/06/2021   Polyneuropathy associated with underlying disease ([place]) 01/06/2021   Chronic kidney disease, stage 4 (severe) ([place]) 01/06/2021   Dementia ([place]) 07/13/2020   Cognitive deficits     Carotid bruit 05/26/2020   Hyperlipidemia associated with type 2 diabetes mellitus ([place]) 12/08/2019   Left knee pain 02/11/2017   Iron deficiency 01/21/2017   Vitamin D deficiency 10/15/2016   Urinary incontinence 10/15/2016   Limited mobility 05/27/2016   Type 2 diabetes mellitus with hyperglycemia ([place]) 02/04/2016   Sleep terror disorder 12/14/2015   Fecal incontinence 12/13/2015   Back pain of lumbar region with sciatica 04/15/2015   Spondylosis, cervical, with myelopathy 04/14/2015   OSA (obstructive sleep apnea) 04/10/2013   Peripheral neuropathy 03/12/2013   Hearing loss 11/06/2012   Seizure-like activity ([place]) 01/25/2012   UNSTEADY GAIT 11/07/2010   Alpha thalassemia ([place]) 01/26/2010   Morbid obesity ([place]) 04/21/2008   Anxiety and depression 04/21/2008   Essential hypertension 04/21/2008   GERD 04/21/2008   Osteoarthritis of left knee 04/21/2008   10:49 AM, 03/15/21 M. Sherlyn Lees, PT, DPT Physical Therapist- [place] Office Number: [phone]   [facility] [address] [place], [place], [address] Phone: [phone]   Fax:  [phone]  Name: Laura Atkins MRN: [id number] Date of Birth: Aug 22, 1941

## 2021-03-20 ENCOUNTER — Other Ambulatory Visit: Payer: Self-pay

## 2021-03-20 ENCOUNTER — Ambulatory Visit (HOSPITAL_COMMUNITY): Payer: Medicare HMO | Admitting: Physical Therapy

## 2021-03-20 DIAGNOSIS — R2689 Other abnormalities of gait and mobility: Secondary | ICD-10-CM

## 2021-03-20 DIAGNOSIS — M6281 Muscle weakness (generalized): Secondary | ICD-10-CM

## 2021-03-20 DIAGNOSIS — R262 Difficulty in walking, not elsewhere classified: Secondary | ICD-10-CM

## 2021-03-20 DIAGNOSIS — M25562 Pain in left knee: Secondary | ICD-10-CM

## 2021-03-20 DIAGNOSIS — M24562 Contracture, left knee: Secondary | ICD-10-CM | POA: Diagnosis not present

## 2021-03-20 DIAGNOSIS — G8929 Other chronic pain: Secondary | ICD-10-CM | POA: Diagnosis not present

## 2021-03-20 NOTE — Therapy (Signed)
[facility] [place], [place], [address] Phone: [phone]   Fax:  [phone]  Physical Therapy Treatment  Patient Details  Name: Laura Atkins MRN: [id number] Date of Birth: Oct 01, 1940 Referring Provider (PT): Tula Nakayama   Encounter Date: 03/20/2021   PT End of Session - 03/20/21 1612     Visit Number 3    Number of Visits 12    Date for PT Re-Evaluation 04/19/21    Authorization Type [employer] H[employer]    Progress Note Due on Visit 10    PT Start Time 1004    PT Stop Time 1049    PT Time Calculation (min) 45 min    Equipment Utilized During Treatment Gait belt    Activity Tolerance Patient tolerated treatment well;Patient limited by fatigue    Behavior During Therapy [facility] for tasks assessed/performed             Past Medical History:  Diagnosis Date   Alpha thalassemia trait 01/26/2010   02/2012: Nl CBC ex H&H-10.7/34.8, MCV-69    Anemia    Anxiety    Anxiety and depression    Cellulitis 05/09/2017   Depression    Diabetes mellitus    Foot pain, right 04/30/2013   GERD (gastroesophageal reflux disease)    Headache(784.0)    Hyperlipidemia    Hypertension    Iron deficiency 01/21/2017   Microcytic anemia 01/26/2010   02/2012: Nl CBC ex H&H-10.7/34.8, MCV-69    NECK PAIN, CHRONIC 10/21/2008   +chronic back pain     Obesity    Obstructive sleep apnea    Osteoarthritis    Left knee; right shoulder; chronic neck and back pain   Pruritus    PVD (peripheral vascular disease) ([place]) 01/28/2014   Seizures (HCC)    Shoulder pain, right 04/14/2015   Tremor    This started months ago after her seizure progressing to very poor hand writing   Urinary incontinence    UTI (urinary tract infection) 01/18/2013    Past Surgical History:  Procedure Laterality Date   ABDOMINAL HYSTERECTOMY     BREAST EXCISIONAL BIOPSY     Left; cyst   CATARACT EXTRACTION Right    12/2017   CATARACT EXTRACTION W/ INTRAOCULAR LENS  IMPLANT Left 09/07/2013   CHOLECYSTECTOMY     COLONOSCOPY     COLONOSCOPY N/A 07/20/2015   Procedure: COLONOSCOPY;  Surgeon: Rogene Houston, MD;  [facility];  Service: Endoscopy;  Laterality: N/A;  930   EYE SURGERY Left 09/07/2013   cataract    There were no vitals filed for this visit.   Subjective Assessment - 03/20/21 1012     Subjective pt states her Lt shoulder hurts her worse than her knee.  6/10 Lt shoulder, 0/10 Lt knee pr bacl    Currently in Pain? Yes    Pain Score 7     Pain Location Shoulder    Pain Orientation Left    Pain Descriptors / Indicators Aching;Sore;Throbbing                               [facility] Adult PT Treatment/Exercise - 03/20/21 0001       Knee/Hip Exercises: Seated   Other Seated Knee/Hip Exercises UE flexion to 90 degrees, scap retraction and shoulder rolls 10X each    Sit to Sand 5 reps;without UE support;1 set   mat elevated to 23 inches  and min assist from therapist     Knee/Hip Exercises: Supine   Bridges 2 sets;10 reps    Bridges Limitations 3" holds    Straight Leg Raises Both;10 reps      Knee/Hip Exercises: Sidelying   Hip ABduction Both;10 reps    Clams 5X5" each                      PT Short Term Goals - 03/08/21 1433       PT SHORT TERM GOAL #1   Title Patient will independently verbalize and demo initial HEP in order to improve B LE strength with ambulation and transfers.     Time 3    Period Weeks    Status New    Target Date 03/29/21      PT SHORT TERM GOAL #2   Title Patient will demo functional transfers with supervision to reduce burden of care    Baseline max A for sit to stand    Time 3    Period Weeks    Status New    Target Date 03/29/21      PT SHORT TERM GOAL #3   Title PT  will be able to ambulate 114f with a RW in 2 minute time period to demonstrate reduce risk of falling.    Baseline 54 ft w/ CGA    Time 3    Period Weeks    Status New    Target Date  03/29/21      PT SHORT TERM GOAL #4   Title Decrease risk for falls per TUG test time of 30 sec    Baseline 75 sec with RW    Time 3    Period Weeks    Status New    Target Date 03/29/21               PT Long Term Goals - 03/08/21 1525       PT LONG TERM GOAL #1   Title Demo functional transfers with modified independence to reduce caregiver assistance    Baseline max A    Time 6    Period Weeks    Status New    Target Date 04/19/21      PT LONG TERM GOAL #2   Title Decrease risk for falls per time of 15 sec TUG test    Baseline 75 sec with RW    Time 6    Period Weeks    Status New    Target Date 04/19/21      PT LONG TERM GOAL #3   Title Patient will improve FOTO score by at least 10 points in order to indicate improved tolerance to activity.    Baseline 18% function    Time 6    Period Weeks    Status New    Target Date 04/19/21                   Plan - 03/20/21 1609     Clinical Impression Statement Worked on sitting upright and completed UE/postural therex initially.  Pt able to maintain upright posturing for 3-4 reps before starting to lean back into extension.  Constant verbal cues to pull self forward and maintain posturing.  Worked on standing without UE assist with elevated surface.  Able to complete task with 23 inch elevation and max cues for engaging glutes and LE mm.  Min assist from therapist due to pt reporting fear of falling.  Completed remainder of session on LE strengthening.  Extreme hip mm weakness with tactile cues to complete/isolate correct mm and for form.  Minimal clearance of bottom with bridge due to weakness.    Personal Factors and Comorbidities Fitness;Past/Current Experience;Comorbidity 3+;Time since onset of injury/illness/exacerbation    Comorbidities HTN, seizure like activity, DM, memory issues    Examination-Activity Limitations Caring for Others;Carry;Dressing;Lift;Locomotion Level;Stairs;Stand;Squat;Transfers     Examination-Participation Restrictions Cleaning;Laundry;Meal Prep;Shop;Church    Stability/Clinical Decision Making Evolving/Moderate complexity    Rehab Potential Fair    PT Frequency 2x / week    PT Duration 6 weeks    PT Treatment/Interventions Gait training;Stair training;Functional mobility training;Therapeutic activities;Therapeutic exercise;Balance training;Neuromuscular re-education;Patient/family education;ADLs/Self Care Home Management;DME Instruction;Traction;Manual techniques;Passive range of motion;Dry needling    PT Next Visit Plan continue to progress LE strength and functional mobility.    PT Home Exercise Plan QS, heel slides    Consulted and Agree with Plan of Care Patient;Family member/caregiver    Family Member Consulted Husband             Patient will benefit from skilled therapeutic intervention in order to improve the following deficits and impairments:  Abnormal gait, Decreased activity tolerance, Decreased balance, Decreased endurance, Decreased mobility, Decreased range of motion, Difficulty walking, Decreased strength, Pain, Decreased knowledge of use of DME, Improper body mechanics, Obesity  Visit Diagnosis: Chronic pain of left knee  Muscle weakness (generalized)  Difficulty in walking, not elsewhere classified  Other abnormalities of gait and mobility     Problem List Patient Active Problem List   Diagnosis Date Noted   Pressure injury of right heel, unstageable ([place]) 01/06/2021   Polyneuropathy associated with underlying disease ([place]) 01/06/2021   Chronic kidney disease, stage 4 (severe) ([place]) 01/06/2021   Dementia ([place]) 07/13/2020   Cognitive deficits    Carotid bruit 05/26/2020   Hyperlipidemia associated with type 2 diabetes mellitus ([place]) 12/08/2019   Left knee pain 02/11/2017   Iron deficiency 01/21/2017   Vitamin D deficiency 10/15/2016   Urinary incontinence 10/15/2016   Limited mobility 05/27/2016   Type 2 diabetes mellitus with  hyperglycemia ([place]) 02/04/2016   Sleep terror disorder 12/14/2015   Fecal incontinence 12/13/2015   Back pain of lumbar region with sciatica 04/15/2015   Spondylosis, cervical, with myelopathy 04/14/2015   OSA (obstructive sleep apnea) 04/10/2013   Peripheral neuropathy 03/12/2013   Hearing loss 11/06/2012   Seizure-like activity ([place]) 01/25/2012   UNSTEADY GAIT 11/07/2010   Alpha thalassemia ([place]) 01/26/2010   Morbid obesity ([place]) 04/21/2008   Anxiety and depression 04/21/2008   Essential hypertension 04/21/2008   GERD 04/21/2008   Osteoarthritis of left knee 04/21/2008   Teena Irani, PTA/CLT [phone]  Teena Irani 03/20/2021, 4:12 PM  [facility] [address] [place], [place], [address] Phone: [phone]   Fax:  [phone]  Name: Laura Atkins MRN: [id number] Date of Birth: 05/01/1941

## 2021-03-22 ENCOUNTER — Ambulatory Visit (HOSPITAL_COMMUNITY): Payer: Medicare HMO

## 2021-03-22 ENCOUNTER — Encounter (HOSPITAL_COMMUNITY): Payer: Self-pay

## 2021-03-22 ENCOUNTER — Other Ambulatory Visit: Payer: Self-pay

## 2021-03-22 DIAGNOSIS — M25562 Pain in left knee: Secondary | ICD-10-CM | POA: Diagnosis not present

## 2021-03-22 DIAGNOSIS — M6281 Muscle weakness (generalized): Secondary | ICD-10-CM

## 2021-03-22 DIAGNOSIS — R262 Difficulty in walking, not elsewhere classified: Secondary | ICD-10-CM

## 2021-03-22 DIAGNOSIS — R2689 Other abnormalities of gait and mobility: Secondary | ICD-10-CM | POA: Diagnosis not present

## 2021-03-22 DIAGNOSIS — M24562 Contracture, left knee: Secondary | ICD-10-CM

## 2021-03-22 DIAGNOSIS — G8929 Other chronic pain: Secondary | ICD-10-CM | POA: Diagnosis not present

## 2021-03-22 NOTE — Therapy (Signed)
[place], [place], [address] Phone: [phone]   Fax:  [phone]  Physical Therapy Treatment  Patient Details  Name: Laura Atkins MRN: [id number] Date of Birth: April 25, 1941 Referring Provider (PT): Tula Nakayama   Encounter Date: 03/22/2021   PT End of Session - 03/22/21 1428     Visit Number 4    Number of Visits 12    Date for PT Re-Evaluation 04/19/21    Authorization Type [employer] H[employer]    Progress Note Due on Visit 10    PT Start Time 1425    PT Stop Time 1515    PT Time Calculation (min) 50 min    Equipment Utilized During Treatment Gait belt    Activity Tolerance Patient tolerated treatment well;Patient limited by fatigue    Behavior During Therapy [facility] for tasks assessed/performed             Past Medical History:  Diagnosis Date   Alpha thalassemia trait 01/26/2010   02/2012: Nl CBC ex H&H-10.7/34.8, MCV-69    Anemia    Anxiety    Anxiety and depression    Cellulitis 05/09/2017   Depression    Diabetes mellitus    Foot pain, right 04/30/2013   GERD (gastroesophageal reflux disease)    Headache(784.0)    Hyperlipidemia    Hypertension    Iron deficiency 01/21/2017   Microcytic anemia 01/26/2010   02/2012: Nl CBC ex H&H-10.7/34.8, MCV-69    NECK PAIN, CHRONIC 10/21/2008   +chronic back pain     Obesity    Obstructive sleep apnea    Osteoarthritis    Left knee; right shoulder; chronic neck and back pain   Pruritus    PVD (peripheral vascular disease) ([place]) 01/28/2014   Seizures (HCC)    Shoulder pain, right 04/14/2015   Tremor    This started months ago after her seizure progressing to very poor hand writing   Urinary incontinence    UTI (urinary tract infection) 01/18/2013    Past Surgical History:  Procedure Laterality Date   ABDOMINAL HYSTERECTOMY     BREAST EXCISIONAL BIOPSY     Left; cyst   CATARACT EXTRACTION Right    12/2017   CATARACT EXTRACTION W/ INTRAOCULAR LENS  IMPLANT Left 09/07/2013   CHOLECYSTECTOMY     COLONOSCOPY     COLONOSCOPY N/A 07/20/2015   Procedure: COLONOSCOPY;  Surgeon: Rogene Houston, MD;  [facility];  Service: Endoscopy;  Laterality: N/A;  930   EYE SURGERY Left 09/07/2013   cataract    There were no vitals filed for this visit.   Subjective Assessment - 03/22/21 1425     Subjective Pt notes continued left shoulder pain limiting her ability to push self up from chair                [facility] PT Assessment - 03/22/21 0001       Assessment   Medical Diagnosis Primary osteoarthritis of left knee M17.12    Referring Provider (PT) Tula Nakayama    Onset Date/Surgical Date 01/16/21                           [facility] Adult PT Treatment/Exercise - 03/22/21 0001       Transfers   Transfers Sit to Stand    Sit to Stand 4: Min guard    Transfer Cueing cues for trunk flexion over BOS and using  momentum      Therapeutic Activites    Therapeutic Activities --    Other Therapeutic Activities --      Knee/Hip Exercises: Aerobic   Nustep level 2 x 8 min at end of session to improve aerobic capacity and activity tolerance      Knee/Hip Exercises: Standing   Other Standing Knee Exercises 4" stair taps 3x12 reps 3 lbs      Knee/Hip Exercises: Seated   Long Arc Quad Strengthening;Both;3 sets;15 reps    Long Arc Quad Weight 3 lbs.    Ball Squeeze 3x15    Clamshell with TheraBand --   3x15 manual resistance                   PT Education - 03/22/21 1508     Education Details education on benefits of regular physical activity and pt and husband shown how to [place] for seated exercises to improve compliance with routine physical activity    Person(s) Educated Patient;Spouse    Methods Explanation;Demonstration    Comprehension Verbalized understanding              PT Short Term Goals - 03/08/21 1433       PT SHORT TERM GOAL #1   Title Patient will independently  verbalize and demo initial HEP in order to improve B LE strength with ambulation and transfers.     Time 3    Period Weeks    Status New    Target Date 03/29/21      PT SHORT TERM GOAL #2   Title Patient will demo functional transfers with supervision to reduce burden of care    Baseline max A for sit to stand    Time 3    Period Weeks    Status New    Target Date 03/29/21      PT SHORT TERM GOAL #3   Title PT  will be able to ambulate 136f with a RW in 2 minute time period to demonstrate reduce risk of falling.    Baseline 54 ft w/ CGA    Time 3    Period Weeks    Status New    Target Date 03/29/21      PT SHORT TERM GOAL #4   Title Decrease risk for falls per TUG test time of 30 sec    Baseline 75 sec with RW    Time 3    Period Weeks    Status New    Target Date 03/29/21               PT Long Term Goals - 03/08/21 1525       PT LONG TERM GOAL #1   Title Demo functional transfers with modified independence to reduce caregiver assistance    Baseline max A    Time 6    Period Weeks    Status New    Target Date 04/19/21      PT LONG TERM GOAL #2   Title Decrease risk for falls per time of 15 sec TUG test    Baseline 75 sec with RW    Time 6    Period Weeks    Status New    Target Date 04/19/21      PT LONG TERM GOAL #3   Title Patient will improve FOTO score by at least 10 points in order to indicate improved tolerance to activity.    Baseline 18% function    Time 6  Period Weeks    Status New    Target Date 04/19/21                   Plan - 03/22/21 1509     Clinical Impression Statement Improved transfers appreciated today requiring frequent cues to enact momentum over BOS to improve ease of mobility.  Continued POC indicated to improve BLE strength and activity tolerance to improve standing tolerance and safety with ambulation    Personal Factors and Comorbidities Fitness;Past/Current Experience;Comorbidity 3+;Time since onset of  injury/illness/exacerbation    Comorbidities HTN, seizure like activity, DM, memory issues    Examination-Activity Limitations Caring for Others;Carry;Dressing;Lift;Locomotion Level;Stairs;Stand;Squat;Transfers    Examination-Participation Restrictions Cleaning;Laundry;Meal Prep;Shop;Church    Stability/Clinical Decision Making Evolving/Moderate complexity    Rehab Potential Fair    PT Frequency 2x / week    PT Duration 6 weeks    PT Treatment/Interventions Gait training;Stair training;Functional mobility training;Therapeutic activities;Therapeutic exercise;Balance training;Neuromuscular re-education;Patient/family education;ADLs/Self Care Home Management;DME Instruction;Traction;Manual techniques;Passive range of motion;Dry needling    PT Next Visit Plan continue to progress LE strength and functional mobility.    PT Home Exercise Plan QS, heel slides    Consulted and Agree with Plan of Care Patient;Family member/caregiver    Family Member Consulted Husband             Patient will benefit from skilled therapeutic intervention in order to improve the following deficits and impairments:  Abnormal gait, Decreased activity tolerance, Decreased balance, Decreased endurance, Decreased mobility, Decreased range of motion, Difficulty walking, Decreased strength, Pain, Decreased knowledge of use of DME, Improper body mechanics, Obesity  Visit Diagnosis: Chronic pain of left knee  Muscle weakness (generalized)  Difficulty in walking, not elsewhere classified  Other abnormalities of gait and mobility  Flexion contracture of left knee     Problem List Patient Active Problem List   Diagnosis Date Noted   Pressure injury of right heel, unstageable ([place]) 01/06/2021   Polyneuropathy associated with underlying disease ([place]) 01/06/2021   Chronic kidney disease, stage 4 (severe) ([place]) 01/06/2021   Dementia ([place]) 07/13/2020   Cognitive deficits    Carotid bruit 05/26/2020   Hyperlipidemia  associated with type 2 diabetes mellitus ([place]) 12/08/2019   Left knee pain 02/11/2017   Iron deficiency 01/21/2017   Vitamin D deficiency 10/15/2016   Urinary incontinence 10/15/2016   Limited mobility 05/27/2016   Type 2 diabetes mellitus with hyperglycemia ([place]) 02/04/2016   Sleep terror disorder 12/14/2015   Fecal incontinence 12/13/2015   Back pain of lumbar region with sciatica 04/15/2015   Spondylosis, cervical, with myelopathy 04/14/2015   OSA (obstructive sleep apnea) 04/10/2013   Peripheral neuropathy 03/12/2013   Hearing loss 11/06/2012   Seizure-like activity ([place]) 01/25/2012   UNSTEADY GAIT 11/07/2010   Alpha thalassemia ([place]) 01/26/2010   Morbid obesity ([place]) 04/21/2008   Anxiety and depression 04/21/2008   Essential hypertension 04/21/2008   GERD 04/21/2008   Osteoarthritis of left knee 04/21/2008   3:11 PM, 03/22/21 M. Sherlyn Lees, PT, DPT Physical Therapist- [place] Office Number: [phone]   [facility] [address] [place], [place], [address] Phone: [phone]   Fax:  [phone]  Name: Laura Atkins MRN: [id number] Date of Birth: 05-17-1941

## 2021-03-26 ENCOUNTER — Other Ambulatory Visit: Payer: Self-pay | Admitting: Family Medicine

## 2021-03-28 ENCOUNTER — Ambulatory Visit: Payer: Medicare HMO | Admitting: Family Medicine

## 2021-03-29 ENCOUNTER — Other Ambulatory Visit: Payer: Self-pay

## 2021-03-29 ENCOUNTER — Ambulatory Visit (HOSPITAL_COMMUNITY): Payer: Medicare HMO | Attending: Family Medicine

## 2021-03-29 DIAGNOSIS — G8929 Other chronic pain: Secondary | ICD-10-CM | POA: Diagnosis not present

## 2021-03-29 DIAGNOSIS — M25562 Pain in left knee: Secondary | ICD-10-CM | POA: Insufficient documentation

## 2021-03-29 DIAGNOSIS — M6281 Muscle weakness (generalized): Secondary | ICD-10-CM | POA: Diagnosis not present

## 2021-03-29 DIAGNOSIS — M24562 Contracture, left knee: Secondary | ICD-10-CM | POA: Diagnosis not present

## 2021-03-29 DIAGNOSIS — R262 Difficulty in walking, not elsewhere classified: Secondary | ICD-10-CM | POA: Insufficient documentation

## 2021-03-29 DIAGNOSIS — R2689 Other abnormalities of gait and mobility: Secondary | ICD-10-CM | POA: Insufficient documentation

## 2021-03-29 NOTE — Therapy (Signed)
[place], [place], [address] Phone: [phone]   Fax:  [phone]  Physical Therapy Treatment  Patient Details  Name: Laura Atkins MRN: [id number] Date of Birth: 26-Aug-1941 Referring Provider (PT): Tula Nakayama   Encounter Date: 03/29/2021   PT End of Session - 03/29/21 1113     Visit Number 5    Number of Visits 12    Date for PT Re-Evaluation 04/19/21    Authorization Type [employer] H[employer]    Progress Note Due on Visit 10    PT Start Time 1115    PT Stop Time 1200    PT Time Calculation (min) 45 min    Equipment Utilized During Treatment Gait belt    Activity Tolerance Patient tolerated treatment well;Patient limited by fatigue    Behavior During Therapy [facility] for tasks assessed/performed             Past Medical History:  Diagnosis Date   Alpha thalassemia trait 01/26/2010   02/2012: Nl CBC ex H&H-10.7/34.8, MCV-69    Anemia    Anxiety    Anxiety and depression    Cellulitis 05/09/2017   Depression    Diabetes mellitus    Foot pain, right 04/30/2013   GERD (gastroesophageal reflux disease)    Headache(784.0)    Hyperlipidemia    Hypertension    Iron deficiency 01/21/2017   Microcytic anemia 01/26/2010   02/2012: Nl CBC ex H&H-10.7/34.8, MCV-69    NECK PAIN, CHRONIC 10/21/2008   +chronic back pain     Obesity    Obstructive sleep apnea    Osteoarthritis    Left knee; right shoulder; chronic neck and back pain   Pruritus    PVD (peripheral vascular disease) ([place]) 01/28/2014   Seizures (HCC)    Shoulder pain, right 04/14/2015   Tremor    This started months ago after her seizure progressing to very poor hand writing   Urinary incontinence    UTI (urinary tract infection) 01/18/2013    Past Surgical History:  Procedure Laterality Date   ABDOMINAL HYSTERECTOMY     BREAST EXCISIONAL BIOPSY     Left; cyst   CATARACT EXTRACTION Right    12/2017   CATARACT EXTRACTION W/ INTRAOCULAR LENS  IMPLANT Left 09/07/2013   CHOLECYSTECTOMY     COLONOSCOPY     COLONOSCOPY N/A 07/20/2015   Procedure: COLONOSCOPY;  Surgeon: Rogene Houston, MD;  [facility];  Service: Endoscopy;  Laterality: N/A;  930   EYE SURGERY Left 09/07/2013   cataract    There were no vitals filed for this visit.   Subjective Assessment - 03/29/21 1141     Subjective Pt notes continued left shoulder discomfort.  Reports family wouldn't assist her in setting up [employer] fitness videos for seated exercise                [facility] PT Assessment - 03/29/21 0001       Assessment   Medical Diagnosis Primary osteoarthritis of left knee M17.12    Referring Provider (PT) Tula Nakayama    Onset Date/Surgical Date 01/16/21      Transfers   Transfers Sit to Stand    Sit to Stand 4: Min assist;4: Min guard      Timed Up and Go Test   Normal TUG (seconds) 112   seconds, with RW  [facility] Adult PT Treatment/Exercise - 03/29/21 0001       Knee/Hip Exercises: Standing   Other Standing Knee Exercises sidestepping 2x2 min 3 lbs ankle weights in // bars      Knee/Hip Exercises: Seated   Long Arc Quad Strengthening;Both;3 sets;15 reps    Long Arc Quad Weight 3 lbs.    Sit to Sand 10 reps;with UE support   emphasis on trunk flexion over BOS and generating momentum, able to complete CGA by end of trials                     PT Short Term Goals - 03/29/21 1142       PT SHORT TERM GOAL #1   Title Patient will independently verbalize and demo initial HEP in order to improve B LE strength with ambulation and transfers.     Baseline poor compliance to date    Time 3    Period Weeks    Status On-going    Target Date 04/12/21      PT SHORT TERM GOAL #2   Title Patient will demo functional transfers with supervision to reduce burden of care    Baseline CGA-min A    Time 3    Period Weeks    Status On-going    Target Date 04/12/21      PT  SHORT TERM GOAL #3   Title PT  will be able to ambulate 111f with a RW in 2 minute time period to demonstrate reduce risk of falling.    Baseline 54 ft w/ CGA    Time 3    Period Weeks    Status On-going    Target Date 03/29/21      PT SHORT TERM GOAL #4   Title Decrease risk for falls per TUG test time of 30 sec    Baseline 75 sec with RW at start of care. 112 seconds with RW on this date    Time 3    Period Weeks    Status On-going    Target Date 04/12/21               PT Long Term Goals - 03/08/21 1525       PT LONG TERM GOAL #1   Title Demo functional transfers with modified independence to reduce caregiver assistance    Baseline max A    Time 6    Period Weeks    Status New    Target Date 04/19/21      PT LONG TERM GOAL #2   Title Decrease risk for falls per time of 15 sec TUG test    Baseline 75 sec with RW    Time 6    Period Weeks    Status New    Target Date 04/19/21      PT LONG TERM GOAL #3   Title Patient will improve FOTO score by at least 10 points in order to indicate improved tolerance to activity.    Baseline 18% function    Time 6    Period Weeks    Status New    Target Date 04/19/21                   Plan - 03/29/21 1153     Clinical Impression Statement Demonstrates improved independence for sit to stand transfers requiring min-CGA assist but with consistent cues for advancing over BOS to improve momentum transfer.  Increased time for TUG test today, but  this was performed at end of session with element of fatigue possibly limiting her gait speed/tolerance.  Continued sessions indicated to improve BLE ROM/strength, gait speed, and activity tolerance to reduce level of assistance from caregivers    Personal Factors and Comorbidities Fitness;Past/Current Experience;Comorbidity 3+;Time since onset of injury/illness/exacerbation    Comorbidities HTN, seizure like activity, DM, memory issues    Examination-Activity Limitations Caring  for Others;Carry;Dressing;Lift;Locomotion Level;Stairs;Stand;Squat;Transfers    Examination-Participation Restrictions Cleaning;Laundry;Meal Prep;Shop;Church    Rehab Potential Fair    PT Frequency 2x / week    PT Duration 6 weeks    PT Treatment/Interventions Gait training;Stair training;Functional mobility training;Therapeutic activities;Therapeutic exercise;Balance training;Neuromuscular re-education;Patient/family education;ADLs/Self Care Home Management;DME Instruction;Traction;Manual techniques;Passive range of motion;Dry needling    PT Next Visit Plan continue to progress LE strength and functional mobility.    PT Home Exercise Plan QS, heel slides    Consulted and Agree with Plan of Care Patient;Family member/caregiver             Patient will benefit from skilled therapeutic intervention in order to improve the following deficits and impairments:  Abnormal gait, Decreased activity tolerance, Decreased balance, Decreased endurance, Decreased mobility, Decreased range of motion, Difficulty walking, Decreased strength, Pain, Decreased knowledge of use of DME, Improper body mechanics, Obesity  Visit Diagnosis: Chronic pain of left knee  Muscle weakness (generalized)  Difficulty in walking, not elsewhere classified  Other abnormalities of gait and mobility     Problem List Patient Active Problem List   Diagnosis Date Noted   Pressure injury of right heel, unstageable ([place]) 01/06/2021   Polyneuropathy associated with underlying disease ([place]) 01/06/2021   Chronic kidney disease, stage 4 (severe) ([place]) 01/06/2021   Dementia ([place]) 07/13/2020   Cognitive deficits    Carotid bruit 05/26/2020   Hyperlipidemia associated with type 2 diabetes mellitus ([place]) 12/08/2019   Left knee pain 02/11/2017   Iron deficiency 01/21/2017   Vitamin D deficiency 10/15/2016   Urinary incontinence 10/15/2016   Limited mobility 05/27/2016   Type 2 diabetes mellitus with hyperglycemia ([place])  02/04/2016   Sleep terror disorder 12/14/2015   Fecal incontinence 12/13/2015   Back pain of lumbar region with sciatica 04/15/2015   Spondylosis, cervical, with myelopathy 04/14/2015   OSA (obstructive sleep apnea) 04/10/2013   Peripheral neuropathy 03/12/2013   Hearing loss 11/06/2012   Seizure-like activity ([place]) 01/25/2012   UNSTEADY GAIT 11/07/2010   Alpha thalassemia ([place]) 01/26/2010   Morbid obesity ([place]) 04/21/2008   Anxiety and depression 04/21/2008   Essential hypertension 04/21/2008   GERD 04/21/2008   Osteoarthritis of left knee 04/21/2008   11:58 AM, 03/29/21 M. Sherlyn Lees, PT, DPT Physical Therapist- [place] Office Number: [phone]   [facility] [address] [place], [place], [address] Phone: [phone]   Fax:  [phone]  Name: Laura Atkins MRN: [id number] Date of Birth: 07/04/1941

## 2021-03-31 ENCOUNTER — Ambulatory Visit (HOSPITAL_COMMUNITY): Payer: Medicare HMO

## 2021-03-31 ENCOUNTER — Encounter (HOSPITAL_COMMUNITY): Payer: Self-pay

## 2021-03-31 ENCOUNTER — Other Ambulatory Visit: Payer: Self-pay

## 2021-03-31 DIAGNOSIS — R262 Difficulty in walking, not elsewhere classified: Secondary | ICD-10-CM

## 2021-03-31 DIAGNOSIS — M6281 Muscle weakness (generalized): Secondary | ICD-10-CM

## 2021-03-31 DIAGNOSIS — R2689 Other abnormalities of gait and mobility: Secondary | ICD-10-CM

## 2021-03-31 DIAGNOSIS — G8929 Other chronic pain: Secondary | ICD-10-CM | POA: Diagnosis not present

## 2021-03-31 DIAGNOSIS — M25562 Pain in left knee: Secondary | ICD-10-CM | POA: Diagnosis not present

## 2021-03-31 DIAGNOSIS — M24562 Contracture, left knee: Secondary | ICD-10-CM | POA: Diagnosis not present

## 2021-03-31 NOTE — Therapy (Signed)
[place], [place], [address] Phone: [phone]   Fax:  [phone]  Physical Therapy Treatment  Patient Details  Name: Laura Atkins MRN: [id number] Date of Birth: 06/27/1941 Referring Provider (PT): Tula Nakayama   Encounter Date: 03/31/2021   PT End of Session - 03/31/21 0936     Visit Number 6    Number of Visits 12    Date for PT Re-Evaluation 04/19/21    Authorization Type [employer]    Authorization Time Period Cohere 12 visits approved from 6/15-->04/16/21    Authorization - Visit Number 6    Authorization - Number of Visits 12    Progress Note Due on Visit 10    PT Start Time 0920    PT Stop Time 0959    PT Time Calculation (min) 39 min    Equipment Utilized During Treatment Gait belt    Activity Tolerance Patient tolerated treatment well;Patient limited by fatigue    Behavior During Therapy [facility] for tasks assessed/performed             Past Medical History:  Diagnosis Date   Alpha thalassemia trait 01/26/2010   02/2012: Nl CBC ex H&H-10.7/34.8, MCV-69    Anemia    Anxiety    Anxiety and depression    Cellulitis 05/09/2017   Depression    Diabetes mellitus    Foot pain, right 04/30/2013   GERD (gastroesophageal reflux disease)    Headache(784.0)    Hyperlipidemia    Hypertension    Iron deficiency 01/21/2017   Microcytic anemia 01/26/2010   02/2012: Nl CBC ex H&H-10.7/34.8, MCV-69    NECK PAIN, CHRONIC 10/21/2008   +chronic back pain     Obesity    Obstructive sleep apnea    Osteoarthritis    Left knee; right shoulder; chronic neck and back pain   Pruritus    PVD (peripheral vascular disease) ([place]) 01/28/2014   Seizures (HCC)    Shoulder pain, right 04/14/2015   Tremor    This started months ago after her seizure progressing to very poor hand writing   Urinary incontinence    UTI (urinary tract infection) 01/18/2013    Past Surgical History:  Procedure Laterality Date    ABDOMINAL HYSTERECTOMY     BREAST EXCISIONAL BIOPSY     Left; cyst   CATARACT EXTRACTION Right    12/2017   CATARACT EXTRACTION W/ INTRAOCULAR LENS IMPLANT Left 09/07/2013   CHOLECYSTECTOMY     COLONOSCOPY     COLONOSCOPY N/A 07/20/2015   Procedure: COLONOSCOPY;  Surgeon: Rogene Houston, MD;  [facility];  Service: Endoscopy;  Laterality: N/A;  930   EYE SURGERY Left 09/07/2013   cataract    There were no vitals filed for this visit.   Subjective Assessment - 03/31/21 0925     Subjective Pt stated she has some discomfort lateral aspect, pain scale 7/10.    Patient Stated Goals to walk better    Currently in Pain? Yes    Pain Score 7     Pain Location Knee    Pain Orientation Left;Lateral    Pain Descriptors / Indicators Sore;Aching    Pain Type Acute pain    Pain Onset 1 to 4 weeks ago    Pain Frequency Constant    Aggravating Factors  activity,walking    Pain Relieving Factors PT    Effect of Pain on Daily Activities Limits  [facility] Adult PT Treatment/Exercise - 03/31/21 0001       Transfers   Transfers Sit to Stand    Sit to Stand 4: Min assist;Without upper extremity assist      Knee/Hip Exercises: Standing   Other Standing Knee Exercises sidestepping 2x2 min 3 lbs ankle weights in // bars      Knee/Hip Exercises: Seated   Long Arc Quad Strengthening;Both;3 sets;15 reps   cueing to improve seated posture   Long Arc Quad Weight 3 lbs.                      PT Short Term Goals - 03/29/21 1142       PT SHORT TERM GOAL #1   Title Patient will independently verbalize and demo initial HEP in order to improve B LE strength with ambulation and transfers.     Baseline poor compliance to date    Time 3    Period Weeks    Status On-going    Target Date 04/12/21      PT SHORT TERM GOAL #2   Title Patient will demo functional transfers with supervision to reduce burden of care    Baseline  CGA-min A    Time 3    Period Weeks    Status On-going    Target Date 04/12/21      PT SHORT TERM GOAL #3   Title PT  will be able to ambulate 177f with a RW in 2 minute time period to demonstrate reduce risk of falling.    Baseline 54 ft w/ CGA    Time 3    Period Weeks    Status On-going    Target Date 03/29/21      PT SHORT TERM GOAL #4   Title Decrease risk for falls per TUG test time of 30 sec    Baseline 75 sec with RW at start of care. 112 seconds with RW on this date    Time 3    Period Weeks    Status On-going    Target Date 04/12/21               PT Long Term Goals - 03/08/21 1525       PT LONG TERM GOAL #1   Title Demo functional transfers with modified independence to reduce caregiver assistance    Baseline max A    Time 6    Period Weeks    Status New    Target Date 04/19/21      PT LONG TERM GOAL #2   Title Decrease risk for falls per time of 15 sec TUG test    Baseline 75 sec with RW    Time 6    Period Weeks    Status New    Target Date 04/19/21      PT LONG TERM GOAL #3   Title Patient will improve FOTO score by at least 10 points in order to indicate improved tolerance to activity.    Baseline 18% function    Time 6    Period Weeks    Status New    Target Date 04/19/21                   Plan - 03/31/21 1008     Clinical Impression Statement Pt requires constant cueing and assistance with sit to stand for advancing over BOS to improve momentum wiht transfers.  Cueing required for posture through session as tendency to  look down and forward rolled shoulders.  Gait presents with slow cadence and flat foot, cueing for heel to toe and increased stride length to improve gait mechanics.  No reports of pain, was limited by fatigue.    Personal Factors and Comorbidities Fitness;Past/Current Experience;Comorbidity 3+;Time since onset of injury/illness/exacerbation    Comorbidities HTN, seizure like activity, DM, memory issues     Examination-Activity Limitations Caring for Others;Carry;Dressing;Lift;Locomotion Level;Stairs;Stand;Squat;Transfers    Examination-Participation Restrictions Cleaning;Laundry;Meal Prep;Shop;Church    Stability/Clinical Decision Making Evolving/Moderate complexity    Clinical Decision Making Moderate    Rehab Potential Fair    PT Frequency 2x / week    PT Duration 6 weeks    PT Treatment/Interventions Gait training;Stair training;Functional mobility training;Therapeutic activities;Therapeutic exercise;Balance training;Neuromuscular re-education;Patient/family education;ADLs/Self Care Home Management;DME Instruction;Traction;Manual techniques;Passive range of motion;Dry needling    PT Next Visit Plan continue to progress LE strength and functional mobility.    PT Home Exercise Plan QS, heel slides    Consulted and Agree with Plan of Care Patient;Family member/caregiver    Family Member Consulted Husband             Patient will benefit from skilled therapeutic intervention in order to improve the following deficits and impairments:  Abnormal gait, Decreased activity tolerance, Decreased balance, Decreased endurance, Decreased mobility, Decreased range of motion, Difficulty walking, Decreased strength, Pain, Decreased knowledge of use of DME, Improper body mechanics, Obesity  Visit Diagnosis: Chronic pain of left knee  Muscle weakness (generalized)  Difficulty in walking, not elsewhere classified  Other abnormalities of gait and mobility     Problem List Patient Active Problem List   Diagnosis Date Noted   Pressure injury of right heel, unstageable ([place]) 01/06/2021   Polyneuropathy associated with underlying disease ([place]) 01/06/2021   Chronic kidney disease, stage 4 (severe) ([place]) 01/06/2021   Dementia ([place]) 07/13/2020   Cognitive deficits    Carotid bruit 05/26/2020   Hyperlipidemia associated with type 2 diabetes mellitus ([place]) 12/08/2019   Left knee pain 02/11/2017   Iron  deficiency 01/21/2017   Vitamin D deficiency 10/15/2016   Urinary incontinence 10/15/2016   Limited mobility 05/27/2016   Type 2 diabetes mellitus with hyperglycemia ([place]) 02/04/2016   Sleep terror disorder 12/14/2015   Fecal incontinence 12/13/2015   Back pain of lumbar region with sciatica 04/15/2015   Spondylosis, cervical, with myelopathy 04/14/2015   OSA (obstructive sleep apnea) 04/10/2013   Peripheral neuropathy 03/12/2013   Hearing loss 11/06/2012   Seizure-like activity ([place]) 01/25/2012   UNSTEADY GAIT 11/07/2010   Alpha thalassemia ([place]) 01/26/2010   Morbid obesity ([place]) 04/21/2008   Anxiety and depression 04/21/2008   Essential hypertension 04/21/2008   GERD 04/21/2008   Osteoarthritis of left knee 04/21/2008   Ihor Austin, LPTA/CLT; CBIS [phone] Aldona Lento 03/31/2021, 12:18 PM  [facility] [address] [place], [place], [address] Phone: [phone]   Fax:  [phone]  Name: LEIGHNA WORMAN MRN: [id number] Date of Birth: 03-13-1941

## 2021-04-03 ENCOUNTER — Other Ambulatory Visit: Payer: Self-pay

## 2021-04-03 ENCOUNTER — Ambulatory Visit (HOSPITAL_COMMUNITY): Payer: Medicare HMO

## 2021-04-03 DIAGNOSIS — M25562 Pain in left knee: Secondary | ICD-10-CM

## 2021-04-03 DIAGNOSIS — R2689 Other abnormalities of gait and mobility: Secondary | ICD-10-CM | POA: Diagnosis not present

## 2021-04-03 DIAGNOSIS — M24562 Contracture, left knee: Secondary | ICD-10-CM

## 2021-04-03 DIAGNOSIS — R262 Difficulty in walking, not elsewhere classified: Secondary | ICD-10-CM | POA: Diagnosis not present

## 2021-04-03 DIAGNOSIS — M6281 Muscle weakness (generalized): Secondary | ICD-10-CM

## 2021-04-03 DIAGNOSIS — G8929 Other chronic pain: Secondary | ICD-10-CM | POA: Diagnosis not present

## 2021-04-03 NOTE — Therapy (Signed)
[place], [place], [address] Phone: [phone]   Fax:  [phone]  Physical Therapy Treatment  Patient Details  Name: Laura Atkins MRN: [id number] Date of Birth: Feb 15, 1941 Referring Provider (PT): Tula Nakayama   Encounter Date: 04/03/2021   PT End of Session - 04/03/21 0949     Visit Number 7    Number of Visits 12    Date for PT Re-Evaluation 04/19/21    Authorization Type [employer] H[employer]    Authorization Time Period [place] 12 visits approved from 6/15-->04/16/21    Authorization - Visit Number 7    Authorization - Number of Visits 12    Progress Note Due on Visit 10    PT Start Time 0945    PT Stop Time 1030    PT Time Calculation (min) 45 min    Equipment Utilized During Treatment Gait belt    Activity Tolerance Patient tolerated treatment well;Patient limited by fatigue    Behavior During Therapy [facility] for tasks assessed/performed             Past Medical History:  Diagnosis Date   Alpha thalassemia trait 01/26/2010   02/2012: Nl CBC ex H&H-10.7/34.8, MCV-69    Anemia    Anxiety    Anxiety and depression    Cellulitis 05/09/2017   Depression    Diabetes mellitus    Foot pain, right 04/30/2013   GERD (gastroesophageal reflux disease)    Headache(784.0)    Hyperlipidemia    Hypertension    Iron deficiency 01/21/2017   Microcytic anemia 01/26/2010   02/2012: Nl CBC ex H&H-10.7/34.8, MCV-69    NECK PAIN, CHRONIC 10/21/2008   +chronic back pain     Obesity    Obstructive sleep apnea    Osteoarthritis    Left knee; right shoulder; chronic neck and back pain   Pruritus    PVD (peripheral vascular disease) ([place]) 01/28/2014   Seizures (HCC)    Shoulder pain, right 04/14/2015   Tremor    This started months ago after her seizure progressing to very poor hand writing   Urinary incontinence    UTI (urinary tract infection) 01/18/2013    Past Surgical History:  Procedure Laterality Date    ABDOMINAL HYSTERECTOMY     BREAST EXCISIONAL BIOPSY     Left; cyst   CATARACT EXTRACTION Right    12/2017   CATARACT EXTRACTION W/ INTRAOCULAR LENS IMPLANT Left 09/07/2013   CHOLECYSTECTOMY     COLONOSCOPY     COLONOSCOPY N/A 07/20/2015   Procedure: COLONOSCOPY;  Surgeon: Rogene Houston, MD;  [facility];  Service: Endoscopy;  Laterality: N/A;  930   EYE SURGERY Left 09/07/2013   cataract    There were no vitals filed for this visit.   Subjective Assessment - 04/03/21 0955     Subjective Pt stated she has some discomfort lateral aspect, pain scale 7/10. Pt unsure as to nature of discomfort and denies injury or known MOI    Patient is accompained by: Family member    Pertinent History HTN, DM, AKI seizure like activity.    Patient Stated Goals to walk better    Pain Onset 1 to 4 weeks ago                               [facility] Adult PT Treatment/Exercise - 04/03/21 0001       Transfers  Transfers Sit to Stand    Sit to Stand 4: Min assist;With upper extremity assist    Transfer Cueing cues for trunk flexion over BOS      Knee/Hip Exercises: Aerobic   Nustep level 3 x 6 min for dynamic warm-up      Knee/Hip Exercises: Standing   Gait Training forward-retrowalking, sidestepping 3 lbs ankle weights x 2 min    Other Standing Knee Exercises stair taps 6" step 3x10    Other Standing Knee Exercises static standing on airex pad 3x15 sec no UE support      Knee/Hip Exercises: Seated   Long Arc Quad Strengthening;Both;3 sets;10 reps    Long Arc Quad Weight 3 lbs.                      PT Short Term Goals - 03/29/21 1142       PT SHORT TERM GOAL #1   Title Patient will independently verbalize and demo initial HEP in order to improve B LE strength with ambulation and transfers.     Baseline poor compliance to date    Time 3    Period Weeks    Status On-going    Target Date 04/12/21      PT SHORT TERM GOAL #2   Title Patient will  demo functional transfers with supervision to reduce burden of care    Baseline CGA-min A    Time 3    Period Weeks    Status On-going    Target Date 04/12/21      PT SHORT TERM GOAL #3   Title PT  will be able to ambulate 126f with a RW in 2 minute time period to demonstrate reduce risk of falling.    Baseline 54 ft w/ CGA    Time 3    Period Weeks    Status On-going    Target Date 03/29/21      PT SHORT TERM GOAL #4   Title Decrease risk for falls per TUG test time of 30 sec    Baseline 75 sec with RW at start of care. 112 seconds with RW on this date    Time 3    Period Weeks    Status On-going    Target Date 04/12/21               PT Long Term Goals - 04/03/21 1021       PT LONG TERM GOAL #1   Title Demo functional transfers with modified independence to reduce caregiver assistance    Baseline max A; CLOF CGA-min A    Time 6    Period Weeks    Status On-going      PT LONG TERM GOAL #2   Title Decrease risk for falls per time of 15 sec TUG test    Baseline 75 sec with RW    Time 6    Period Weeks    Status On-going      PT LONG TERM GOAL #3   Title Patient will improve FOTO score by at least 10 points in order to indicate improved tolerance to activity.    Baseline 18% function    Time 6    Period Weeks    Status New                   Plan - 04/03/21 1028     Clinical Impression Statement IMproved transfers performance noted today requiring only 50% cues for  trunk flexion over BOS and able to transfer CGA-min A.  Reports decrease in left lateral knee pain at end of session. Continues to ambulate with flexed knee posturing left > right. Continued sessions indicated to improve BLE strength and improve gait speed.    Personal Factors and Comorbidities Fitness;Past/Current Experience;Comorbidity 3+;Time since onset of injury/illness/exacerbation    Comorbidities HTN, seizure like activity, DM, memory issues    Examination-Activity Limitations  Caring for Others;Carry;Dressing;Lift;Locomotion Level;Stairs;Stand;Squat;Transfers    Examination-Participation Restrictions Cleaning;Laundry;Meal Prep;Shop;Church    Stability/Clinical Decision Making Evolving/Moderate complexity    Rehab Potential Fair    PT Frequency 2x / week    PT Duration 6 weeks    PT Treatment/Interventions Gait training;Stair training;Functional mobility training;Therapeutic activities;Therapeutic exercise;Balance training;Neuromuscular re-education;Patient/family education;ADLs/Self Care Home Management;DME Instruction;Traction;Manual techniques;Passive range of motion;Dry needling    PT Next Visit Plan continue to progress LE strength and functional mobility.    PT Home Exercise Plan QS, heel slides    Consulted and Agree with Plan of Care Patient;Family member/caregiver    Family Member Consulted Husband             Patient will benefit from skilled therapeutic intervention in order to improve the following deficits and impairments:  Abnormal gait, Decreased activity tolerance, Decreased balance, Decreased endurance, Decreased mobility, Decreased range of motion, Difficulty walking, Decreased strength, Pain, Decreased knowledge of use of DME, Improper body mechanics, Obesity  Visit Diagnosis: Chronic pain of left knee  Muscle weakness (generalized)  Difficulty in walking, not elsewhere classified  Other abnormalities of gait and mobility  Flexion contracture of left knee     Problem List Patient Active Problem List   Diagnosis Date Noted   Pressure injury of right heel, unstageable ([place]) 01/06/2021   Polyneuropathy associated with underlying disease ([place]) 01/06/2021   Chronic kidney disease, stage 4 (severe) ([place]) 01/06/2021   Dementia ([place]) 07/13/2020   Cognitive deficits    Carotid bruit 05/26/2020   Hyperlipidemia associated with type 2 diabetes mellitus ([place]) 12/08/2019   Left knee pain 02/11/2017   Iron deficiency 01/21/2017   Vitamin D  deficiency 10/15/2016   Urinary incontinence 10/15/2016   Limited mobility 05/27/2016   Type 2 diabetes mellitus with hyperglycemia ([place]) 02/04/2016   Sleep terror disorder 12/14/2015   Fecal incontinence 12/13/2015   Back pain of lumbar region with sciatica 04/15/2015   Spondylosis, cervical, with myelopathy 04/14/2015   OSA (obstructive sleep apnea) 04/10/2013   Peripheral neuropathy 03/12/2013   Hearing loss 11/06/2012   Seizure-like activity ([place]) 01/25/2012   UNSTEADY GAIT 11/07/2010   Alpha thalassemia ([place]) 01/26/2010   Morbid obesity ([place]) 04/21/2008   Anxiety and depression 04/21/2008   Essential hypertension 04/21/2008   GERD 04/21/2008   Osteoarthritis of left knee 04/21/2008   10:30 AM, 04/03/21 M. Sherlyn Lees, PT, DPT Physical Therapist- [place] Office Number: [phone]   [facility] [address] [place], [place], [address] Phone: [phone]   Fax:  [phone]  Name: LOVINA PLETCHER MRN: [id number] Date of Birth: 09-Aug-1941

## 2021-04-05 ENCOUNTER — Ambulatory Visit (HOSPITAL_COMMUNITY): Payer: Medicare HMO | Admitting: Physical Therapy

## 2021-04-05 ENCOUNTER — Other Ambulatory Visit: Payer: Self-pay

## 2021-04-05 DIAGNOSIS — G8929 Other chronic pain: Secondary | ICD-10-CM

## 2021-04-05 DIAGNOSIS — R2689 Other abnormalities of gait and mobility: Secondary | ICD-10-CM

## 2021-04-05 DIAGNOSIS — M6281 Muscle weakness (generalized): Secondary | ICD-10-CM

## 2021-04-05 DIAGNOSIS — M25562 Pain in left knee: Secondary | ICD-10-CM | POA: Diagnosis not present

## 2021-04-05 DIAGNOSIS — M24562 Contracture, left knee: Secondary | ICD-10-CM | POA: Diagnosis not present

## 2021-04-05 DIAGNOSIS — R262 Difficulty in walking, not elsewhere classified: Secondary | ICD-10-CM | POA: Diagnosis not present

## 2021-04-05 NOTE — Therapy (Signed)
[facility] [place], [place], [address] Phone: [phone]   Fax:  [phone]  Physical Therapy Treatment  Patient Details  Name: Laura Atkins MRN: [id number] Date of Birth: 04-Nov-1940 Referring Provider (PT): Tula Nakayama   Encounter Date: 04/05/2021    Past Medical History:  Diagnosis Date   Alpha thalassemia trait 01/26/2010   02/2012: Nl CBC ex H&H-10.7/34.8, MCV-69    Anemia    Anxiety    Anxiety and depression    Cellulitis 05/09/2017   Depression    Diabetes mellitus    Foot pain, right 04/30/2013   GERD (gastroesophageal reflux disease)    Headache(784.0)    Hyperlipidemia    Hypertension    Iron deficiency 01/21/2017   Microcytic anemia 01/26/2010   02/2012: Nl CBC ex H&H-10.7/34.8, MCV-69    NECK PAIN, CHRONIC 10/21/2008   +chronic back pain     Obesity    Obstructive sleep apnea    Osteoarthritis    Left knee; right shoulder; chronic neck and back pain   Pruritus    PVD (peripheral vascular disease) ([place]) 01/28/2014   Seizures (HCC)    Shoulder pain, right 04/14/2015   Tremor    This started months ago after her seizure progressing to very poor hand writing   Urinary incontinence    UTI (urinary tract infection) 01/18/2013    Past Surgical History:  Procedure Laterality Date   ABDOMINAL HYSTERECTOMY     BREAST EXCISIONAL BIOPSY     Left; cyst   CATARACT EXTRACTION Right    12/2017   CATARACT EXTRACTION W/ INTRAOCULAR LENS IMPLANT Left 09/07/2013   CHOLECYSTECTOMY     COLONOSCOPY     COLONOSCOPY N/A 07/20/2015   Procedure: COLONOSCOPY;  Surgeon: Rogene Houston, MD;  [facility];  Service: Endoscopy;  Laterality: N/A;  930   EYE SURGERY Left 09/07/2013   cataract    There were no vitals filed for this visit.                      [facility] Adult PT Treatment/Exercise - 04/05/21 0001       Transfers   Transfers Sit to Stand    Sit to Stand 6: Modified independent  (Device/Increase time);With upper extremity assist      Knee/Hip Exercises: Aerobic   Nustep level 3 x 6 min for dynamic warm-up      Knee/Hip Exercises: Standing   Hip Abduction Both;15 reps    Gait Training sidestepping in // bars no UE assist (hovering over bar) 3RT    Other Standing Knee Exercises stair taps 6" step 15X with 1 UE assist                      PT Short Term Goals - 03/29/21 1142       PT SHORT TERM GOAL #1   Title Patient will independently verbalize and demo initial HEP in order to improve B LE strength with ambulation and transfers.     Baseline poor compliance to date    Time 3    Period Weeks    Status On-going    Target Date 04/12/21      PT SHORT TERM GOAL #2   Title Patient will demo functional transfers with supervision to reduce burden of care    Baseline CGA-min A    Time 3    Period Weeks    Status On-going  Target Date 04/12/21      PT SHORT TERM GOAL #3   Title PT  will be able to ambulate 134f with a RW in 2 minute time period to demonstrate reduce risk of falling.    Baseline 54 ft w/ CGA    Time 3    Period Weeks    Status On-going    Target Date 03/29/21      PT SHORT TERM GOAL #4   Title Decrease risk for falls per TUG test time of 30 sec    Baseline 75 sec with RW at start of care. 112 seconds with RW on this date    Time 3    Period Weeks    Status On-going    Target Date 04/12/21               PT Long Term Goals - 04/03/21 1021       PT LONG TERM GOAL #1   Title Demo functional transfers with modified independence to reduce caregiver assistance    Baseline max A; CLOF CGA-min A    Time 6    Period Weeks    Status On-going      PT LONG TERM GOAL #2   Title Decrease risk for falls per time of 15 sec TUG test    Baseline 75 sec with RW    Time 6    Period Weeks    Status On-going      PT LONG TERM GOAL #3   Title Patient will improve FOTO score by at least 10 points in order to indicate improved  tolerance to activity.    Baseline 18% function    Time 6    Period Weeks    Status New                   Plan - 04/05/21 1550     Clinical Impression Statement Began on nustep with cues to increase ambulation speed and posturing.  Pt with minimal heel rise with heelraises. and required intermittent rest breaks with activities.  Added standing hip abduction and extension with verbal and tactile cues for posturing/reducing substitution.  Pt requires redirection to taskl/focus. Increased difficulty of alternating toe taps  using 6" step and only 1 UE assist.  Pt able to complete sidestepping in bars without UE assist and cues for posturing.  Worked on pushing up from chair rather than pulling and reducing UE assist as able.    Personal Factors and Comorbidities Fitness;Past/Current Experience;Comorbidity 3+;Time since onset of injury/illness/exacerbation    Comorbidities HTN, seizure like activity, DM, memory issues    Examination-Activity Limitations Caring for Others;Carry;Dressing;Lift;Locomotion Level;Stairs;Stand;Squat;Transfers    Examination-Participation Restrictions Cleaning;Laundry;Meal Prep;Shop;Church    Stability/Clinical Decision Making Evolving/Moderate complexity    Rehab Potential Fair    PT Frequency 2x / week    PT Duration 6 weeks    PT Treatment/Interventions Gait training;Stair training;Functional mobility training;Therapeutic activities;Therapeutic exercise;Balance training;Neuromuscular re-education;Patient/family education;ADLs/Self Care Home Management;DME Instruction;Traction;Manual techniques;Passive range of motion;Dry needling    PT Next Visit Plan continue to progress LE strength and functional mobility.    PT Home Exercise Plan QS, heel slides    Consulted and Agree with Plan of Care Patient;Family member/caregiver    Family Member Consulted Husband             Patient will benefit from skilled therapeutic intervention in order to improve the  following deficits and impairments:  Abnormal gait, Decreased activity tolerance, Decreased balance, Decreased endurance, Decreased  mobility, Decreased range of motion, Difficulty walking, Decreased strength, Pain, Decreased knowledge of use of DME, Improper body mechanics, Obesity  Visit Diagnosis: Chronic pain of left knee  Muscle weakness (generalized)  Difficulty in walking, not elsewhere classified  Other abnormalities of gait and mobility     Problem List Patient Active Problem List   Diagnosis Date Noted   Pressure injury of right heel, unstageable ([place]) 01/06/2021   Polyneuropathy associated with underlying disease ([place]) 01/06/2021   Chronic kidney disease, stage 4 (severe) ([place]) 01/06/2021   Dementia ([place]) 07/13/2020   Cognitive deficits    Carotid bruit 05/26/2020   Hyperlipidemia associated with type 2 diabetes mellitus ([place]) 12/08/2019   Left knee pain 02/11/2017   Iron deficiency 01/21/2017   Vitamin D deficiency 10/15/2016   Urinary incontinence 10/15/2016   Limited mobility 05/27/2016   Type 2 diabetes mellitus with hyperglycemia ([place]) 02/04/2016   Sleep terror disorder 12/14/2015   Fecal incontinence 12/13/2015   Back pain of lumbar region with sciatica 04/15/2015   Spondylosis, cervical, with myelopathy 04/14/2015   OSA (obstructive sleep apnea) 04/10/2013   Peripheral neuropathy 03/12/2013   Hearing loss 11/06/2012   Seizure-like activity ([place]) 01/25/2012   UNSTEADY GAIT 11/07/2010   Alpha thalassemia ([place]) 01/26/2010   Morbid obesity ([place]) 04/21/2008   Anxiety and depression 04/21/2008   Essential hypertension 04/21/2008   GERD 04/21/2008   Osteoarthritis of left knee 04/21/2008   Teena Irani, PTA/CLT [phone]  Teena Irani 04/05/2021, 5:09 PM  [facility] [address] [place], [place], [address] Phone: [phone]   Fax:  [phone]  Name: Laura Atkins MRN: [id number] Date of Birth:  Sep 13, 1941

## 2021-04-07 DIAGNOSIS — F0390 Unspecified dementia without behavioral disturbance: Secondary | ICD-10-CM | POA: Diagnosis not present

## 2021-04-07 DIAGNOSIS — E1165 Type 2 diabetes mellitus with hyperglycemia: Secondary | ICD-10-CM | POA: Diagnosis not present

## 2021-04-07 DIAGNOSIS — Z139 Encounter for screening, unspecified: Secondary | ICD-10-CM | POA: Diagnosis not present

## 2021-04-07 DIAGNOSIS — D539 Nutritional anemia, unspecified: Secondary | ICD-10-CM | POA: Diagnosis not present

## 2021-04-09 LAB — HEMOGLOBIN A1C
Est. average glucose Bld gHb Est-mCnc: 240 mg/dL
Hgb A1c MFr Bld: 10 % — ABNORMAL HIGH (ref 4.8–5.6)

## 2021-04-09 LAB — CBC WITH DIFFERENTIAL/PLATELET
Basophils Absolute: 0 10*3/uL (ref 0.0–0.2)
Basos: 1 %
EOS (ABSOLUTE): 0.3 10*3/uL (ref 0.0–0.4)
Eos: 5 %
Hematocrit: 28.3 % — ABNORMAL LOW (ref 34.0–46.6)
Hemoglobin: 8.5 g/dL — ABNORMAL LOW (ref 11.1–15.9)
Immature Grans (Abs): 0 10*3/uL (ref 0.0–0.1)
Immature Granulocytes: 0 %
Lymphocytes Absolute: 2 10*3/uL (ref 0.7–3.1)
Lymphs: 33 %
MCH: 20 pg — ABNORMAL LOW (ref 26.6–33.0)
MCHC: 30 g/dL — ABNORMAL LOW (ref 31.5–35.7)
MCV: 67 fL — ABNORMAL LOW (ref 79–97)
Monocytes Absolute: 0.4 10*3/uL (ref 0.1–0.9)
Monocytes: 6 %
Neutrophils Absolute: 3.3 10*3/uL (ref 1.4–7.0)
Neutrophils: 55 %
Platelets: 225 10*3/uL (ref 150–450)
RBC: 4.25 x10E6/uL (ref 3.77–5.28)
RDW: 15.1 % (ref 11.7–15.4)
WBC: 6.1 10*3/uL (ref 3.4–10.8)

## 2021-04-09 LAB — CMP14+EGFR
ALT: 16 IU/L (ref 0–32)
AST: 14 IU/L (ref 0–40)
Albumin/Globulin Ratio: 1.5 (ref 1.2–2.2)
Albumin: 4.4 g/dL (ref 3.7–4.7)
Alkaline Phosphatase: 137 IU/L — ABNORMAL HIGH (ref 44–121)
BUN/Creatinine Ratio: 23 (ref 12–28)
BUN: 36 mg/dL — ABNORMAL HIGH (ref 8–27)
Bilirubin Total: 0.2 mg/dL (ref 0.0–1.2)
CO2: 23 mmol/L (ref 20–29)
Calcium: 9.9 mg/dL (ref 8.7–10.3)
Chloride: 102 mmol/L (ref 96–106)
Creatinine, Ser: 1.55 mg/dL — ABNORMAL HIGH (ref 0.57–1.00)
Globulin, Total: 2.9 g/dL (ref 1.5–4.5)
Glucose: 195 mg/dL — ABNORMAL HIGH (ref 65–99)
Potassium: 4.8 mmol/L (ref 3.5–5.2)
Sodium: 140 mmol/L (ref 134–144)
Total Protein: 7.3 g/dL (ref 6.0–8.5)
eGFR: 34 mL/min/{1.73_m2} — ABNORMAL LOW (ref >59)

## 2021-04-09 LAB — LIPID PANEL WITH LDL/HDL RATIO
Cholesterol, Total: 144 mg/dL (ref 100–199)
HDL: 58 mg/dL (ref >39)
LDL Chol Calc (NIH): 66 mg/dL (ref 0–99)
LDL/HDL Ratio: 1.1 ratio (ref 0.0–3.2)
Triglycerides: 111 mg/dL (ref 0–149)
VLDL Cholesterol Cal: 20 mg/dL (ref 5–40)

## 2021-04-09 LAB — MICROALBUMIN, URINE: Microalbumin, Urine: 214.5 ug/mL

## 2021-04-09 LAB — HEPATITIS C ANTIBODY: Hep C Virus Ab: 0.1 s/co ratio (ref 0.0–0.9)

## 2021-04-10 ENCOUNTER — Other Ambulatory Visit: Payer: Self-pay

## 2021-04-10 ENCOUNTER — Ambulatory Visit (HOSPITAL_COMMUNITY): Payer: Medicare HMO

## 2021-04-10 ENCOUNTER — Encounter (HOSPITAL_COMMUNITY): Payer: Self-pay

## 2021-04-10 DIAGNOSIS — R2689 Other abnormalities of gait and mobility: Secondary | ICD-10-CM

## 2021-04-10 DIAGNOSIS — M6281 Muscle weakness (generalized): Secondary | ICD-10-CM

## 2021-04-10 DIAGNOSIS — R262 Difficulty in walking, not elsewhere classified: Secondary | ICD-10-CM

## 2021-04-10 DIAGNOSIS — M25562 Pain in left knee: Secondary | ICD-10-CM | POA: Diagnosis not present

## 2021-04-10 DIAGNOSIS — M24562 Contracture, left knee: Secondary | ICD-10-CM | POA: Diagnosis not present

## 2021-04-10 DIAGNOSIS — G8929 Other chronic pain: Secondary | ICD-10-CM | POA: Diagnosis not present

## 2021-04-10 NOTE — Therapy (Signed)
[facility] [place], [place], [address] Phone: [phone]   Fax:  [phone]  Physical Therapy Treatment  Patient Details  Name: Laura Atkins MRN: [id number] Date of Birth: 06-24-1941 Referring Provider (PT): Tula Nakayama   Encounter Date: 04/10/2021   PT End of Session - 04/10/21 0907     Visit Number 8    Number of Visits 12    Date for PT Re-Evaluation 04/19/21    Authorization Type [employer] H[employer]    Authorization Time Period [place] 12 visits approved from 6/15-->04/16/21    Authorization - Visit Number 8    Authorization - Number of Visits 12    Progress Note Due on Visit 10    PT Start Time 0900    PT Stop Time 0945    PT Time Calculation (min) 45 min    Equipment Utilized During Treatment Gait belt    Activity Tolerance Patient tolerated treatment well;Patient limited by fatigue    Behavior During Therapy [facility] for tasks assessed/performed             Past Medical History:  Diagnosis Date   Alpha thalassemia trait 01/26/2010   02/2012: Nl CBC ex H&H-10.7/34.8, MCV-69    Anemia    Anxiety    Anxiety and depression    Cellulitis 05/09/2017   Depression    Diabetes mellitus    Foot pain, right 04/30/2013   GERD (gastroesophageal reflux disease)    Headache(784.0)    Hyperlipidemia    Hypertension    Iron deficiency 01/21/2017   Microcytic anemia 01/26/2010   02/2012: Nl CBC ex H&H-10.7/34.8, MCV-69    NECK PAIN, CHRONIC 10/21/2008   +chronic back pain     Obesity    Obstructive sleep apnea    Osteoarthritis    Left knee; right shoulder; chronic neck and back pain   Pruritus    PVD (peripheral vascular disease) ([place]) 01/28/2014   Seizures (HCC)    Shoulder pain, right 04/14/2015   Tremor    This started months ago after her seizure progressing to very poor hand writing   Urinary incontinence    UTI (urinary tract infection) 01/18/2013    Past Surgical History:  Procedure Laterality Date    ABDOMINAL HYSTERECTOMY     BREAST EXCISIONAL BIOPSY     Left; cyst   CATARACT EXTRACTION Right    12/2017   CATARACT EXTRACTION W/ INTRAOCULAR LENS IMPLANT Left 09/07/2013   CHOLECYSTECTOMY     COLONOSCOPY     COLONOSCOPY N/A 07/20/2015   Procedure: COLONOSCOPY;  Surgeon: Rogene Houston, MD;  [facility];  Service: Endoscopy;  Laterality: N/A;  930   EYE SURGERY Left 09/07/2013   cataract    There were no vitals filed for this visit.   Subjective Assessment - 04/10/21 0921     Subjective "I didn't do much over the weekend. Left shoulder is still bothering me"    Patient is accompained by: Family member    Pertinent History HTN, DM, AKI seizure like activity.    Patient Stated Goals to walk better    Pain Onset 1 to 4 weeks ago                [facility] PT Assessment - 04/10/21 0001       Assessment   Medical Diagnosis Primary osteoarthritis of left knee M17.12    Referring Provider (PT) Tula Nakayama    Onset Date/Surgical Date 01/16/21  [facility] Adult PT Treatment/Exercise - 04/10/21 0001       Knee/Hip Exercises: Aerobic   Nustep level 3 x 6 min for dynamic warm-up      Knee/Hip Exercises: Machines for Strengthening   Cybex Knee Flexion 4.5 plates 3x10      Knee/Hip Exercises: Seated   Sit to Sand 3 sets;5 reps;with UE support   24" seat height                     PT Short Term Goals - 03/29/21 1142       PT SHORT TERM GOAL #1   Title Patient will independently verbalize and demo initial HEP in order to improve B LE strength with ambulation and transfers.     Baseline poor compliance to date    Time 3    Period Weeks    Status On-going    Target Date 04/12/21      PT SHORT TERM GOAL #2   Title Patient will demo functional transfers with supervision to reduce burden of care    Baseline CGA-min A    Time 3    Period Weeks    Status On-going    Target Date 04/12/21      PT SHORT TERM  GOAL #3   Title PT  will be able to ambulate 143f with a RW in 2 minute time period to demonstrate reduce risk of falling.    Baseline 54 ft w/ CGA    Time 3    Period Weeks    Status On-going    Target Date 03/29/21      PT SHORT TERM GOAL #4   Title Decrease risk for falls per TUG test time of 30 sec    Baseline 75 sec with RW at start of care. 112 seconds with RW on this date    Time 3    Period Weeks    Status On-going    Target Date 04/12/21               PT Long Term Goals - 04/03/21 1021       PT LONG TERM GOAL #1   Title Demo functional transfers with modified independence to reduce caregiver assistance    Baseline max A; CLOF CGA-min A    Time 6    Period Weeks    Status On-going      PT LONG TERM GOAL #2   Title Decrease risk for falls per time of 15 sec TUG test    Baseline 75 sec with RW    Time 6    Period Weeks    Status On-going      PT LONG TERM GOAL #3   Title Patient will improve FOTO score by at least 10 points in order to indicate improved tolerance to activity.    Baseline 18% function    Time 6    Period Weeks    Status New                   Plan - 04/10/21 0935     Clinical Impression Statement Continues to ambulate with very slow velocity and flexed knee posturing. Decreased level of assist for sit to stand transfers appreciated demonstrating SBA-CGA for mobility . BLE weakness still evident and continued POC sessions indicated to improve BLE strength, power, and balance to improve mobility and reduce risk for falls    Personal Factors and Comorbidities Fitness;Past/Current Experience;Comorbidity 3+;Time since onset of injury/illness/exacerbation  Comorbidities HTN, seizure like activity, DM, memory issues    Examination-Activity Limitations Caring for Others;Carry;Dressing;Lift;Locomotion Level;Stairs;Stand;Squat;Transfers    Examination-Participation Restrictions Cleaning;Laundry;Meal Prep;Shop;Church    Stability/Clinical  Decision Making Evolving/Moderate complexity    Rehab Potential Fair    PT Frequency 2x / week    PT Duration 6 weeks    PT Treatment/Interventions Gait training;Stair training;Functional mobility training;Therapeutic activities;Therapeutic exercise;Balance training;Neuromuscular re-education;Patient/family education;ADLs/Self Care Home Management;DME Instruction;Traction;Manual techniques;Passive range of motion;Dry needling    PT Next Visit Plan continue to progress LE strength and functional mobility.    PT Home Exercise Plan QS, heel slides    Consulted and Agree with Plan of Care Patient;Family member/caregiver    Family Member Consulted Husband             Patient will benefit from skilled therapeutic intervention in order to improve the following deficits and impairments:  Abnormal gait, Decreased activity tolerance, Decreased balance, Decreased endurance, Decreased mobility, Decreased range of motion, Difficulty walking, Decreased strength, Pain, Decreased knowledge of use of DME, Improper body mechanics, Obesity  Visit Diagnosis: Chronic pain of left knee  Muscle weakness (generalized)  Difficulty in walking, not elsewhere classified  Other abnormalities of gait and mobility  Flexion contracture of left knee     Problem List Patient Active Problem List   Diagnosis Date Noted   Pressure injury of right heel, unstageable ([place]) 01/06/2021   Polyneuropathy associated with underlying disease ([place]) 01/06/2021   Chronic kidney disease, stage 4 (severe) ([place]) 01/06/2021   Dementia ([place]) 07/13/2020   Cognitive deficits    Carotid bruit 05/26/2020   Hyperlipidemia associated with type 2 diabetes mellitus ([place]) 12/08/2019   Left knee pain 02/11/2017   Iron deficiency 01/21/2017   Vitamin D deficiency 10/15/2016   Urinary incontinence 10/15/2016   Limited mobility 05/27/2016   Type 2 diabetes mellitus with hyperglycemia ([place]) 02/04/2016   Sleep terror disorder 12/14/2015    Fecal incontinence 12/13/2015   Back pain of lumbar region with sciatica 04/15/2015   Spondylosis, cervical, with myelopathy 04/14/2015   OSA (obstructive sleep apnea) 04/10/2013   Peripheral neuropathy 03/12/2013   Hearing loss 11/06/2012   Seizure-like activity ([place]) 01/25/2012   UNSTEADY GAIT 11/07/2010   Alpha thalassemia ([place]) 01/26/2010   Morbid obesity ([place]) 04/21/2008   Anxiety and depression 04/21/2008   Essential hypertension 04/21/2008   GERD 04/21/2008   Osteoarthritis of left knee 04/21/2008   9:46 AM, 04/10/21 M. Sherlyn Lees, PT, DPT Physical Therapist- [facility] Office Number: [phone]   [facility] [address] [place], [place], [address] Phone: [phone]   Fax:  [phone]  Name: Laura Atkins MRN: [id number] Date of Birth: 11-Feb-1941

## 2021-04-10 NOTE — Progress Notes (Signed)
You see this pt on the 21st.

## 2021-04-11 ENCOUNTER — Telehealth: Payer: Self-pay

## 2021-04-11 NOTE — Telephone Encounter (Signed)
Patient called checking on lab results. Please return call back # [phone].

## 2021-04-11 NOTE — Telephone Encounter (Signed)
Pt aware.

## 2021-04-11 NOTE — Telephone Encounter (Signed)
She has an appt to review labs on the 21st. What do you want me to tell her before. There is no result note that I see

## 2021-04-12 ENCOUNTER — Encounter (HOSPITAL_COMMUNITY): Payer: Medicare HMO

## 2021-04-12 ENCOUNTER — Telehealth: Payer: Self-pay

## 2021-04-12 LAB — FERRITIN: Ferritin: 50 ng/mL (ref 15–150)

## 2021-04-12 LAB — SPECIMEN STATUS REPORT

## 2021-04-12 LAB — FOLATE: Folate: 4.9 ng/mL (ref >3.0)

## 2021-04-12 LAB — IRON: Iron: 30 ug/dL (ref 27–139)

## 2021-04-12 NOTE — Telephone Encounter (Signed)
Patient called said she needs something for her bladder today, she is still hurting.  Patient is aware she has an appointment tomorrow.   Pharmacy: [facility]

## 2021-04-12 NOTE — Telephone Encounter (Signed)
I spoke with the pharmacy, pt does not have a rx called into the pharmacy. Her appt is tomorrow and we are currently waiting on UA to run and culture. The urine submitted on Friday was for a microalbumin, and at the time pt had no sx's of uti.

## 2021-04-12 NOTE — Telephone Encounter (Signed)
NA x 1, unable to leave message on home phone. L/M on mobile.

## 2021-04-12 NOTE — Telephone Encounter (Signed)
[facility] called said the patient has been calling looking for a prescription but nothing has been called.  [facility] call back # [phone]

## 2021-04-13 ENCOUNTER — Other Ambulatory Visit: Payer: Self-pay

## 2021-04-13 ENCOUNTER — Encounter: Payer: Self-pay | Admitting: Family Medicine

## 2021-04-13 ENCOUNTER — Ambulatory Visit (INDEPENDENT_AMBULATORY_CARE_PROVIDER_SITE_OTHER): Payer: Medicare HMO | Admitting: Family Medicine

## 2021-04-13 ENCOUNTER — Ambulatory Visit (HOSPITAL_COMMUNITY): Payer: Medicare HMO | Admitting: Physical Therapy

## 2021-04-13 VITALS — BP 120/77 | HR 90 | Resp 15 | Ht 68.0 in | Wt 256.4 lb

## 2021-04-13 DIAGNOSIS — N3001 Acute cystitis with hematuria: Secondary | ICD-10-CM | POA: Diagnosis not present

## 2021-04-13 DIAGNOSIS — E1169 Type 2 diabetes mellitus with other specified complication: Secondary | ICD-10-CM | POA: Diagnosis not present

## 2021-04-13 DIAGNOSIS — N184 Chronic kidney disease, stage 4 (severe): Secondary | ICD-10-CM

## 2021-04-13 DIAGNOSIS — E611 Iron deficiency: Secondary | ICD-10-CM

## 2021-04-13 DIAGNOSIS — Z794 Long term (current) use of insulin: Secondary | ICD-10-CM | POA: Diagnosis not present

## 2021-04-13 DIAGNOSIS — Z7409 Other reduced mobility: Secondary | ICD-10-CM | POA: Diagnosis not present

## 2021-04-13 DIAGNOSIS — I10 Essential (primary) hypertension: Secondary | ICD-10-CM

## 2021-04-13 DIAGNOSIS — E785 Hyperlipidemia, unspecified: Secondary | ICD-10-CM

## 2021-04-13 DIAGNOSIS — E1165 Type 2 diabetes mellitus with hyperglycemia: Secondary | ICD-10-CM

## 2021-04-13 LAB — POCT URINALYSIS DIP (CLINITEK)
Bilirubin, UA: NEGATIVE
Glucose, UA: NEGATIVE mg/dL
Ketones, POC UA: NEGATIVE mg/dL
Nitrite, UA: NEGATIVE
POC PROTEIN,UA: 30 — AB
Spec Grav, UA: 1.01 (ref 1.010–1.025)
Urobilinogen, UA: 0.2 E.U./dL
pH, UA: 5 (ref 5.0–8.0)

## 2021-04-13 NOTE — Assessment & Plan Note (Signed)
Refer nephrology

## 2021-04-13 NOTE — Patient Instructions (Addendum)
Annual physical examination in office with MD in 3 months call if you need me sooner.Flu vaccine at visit  Medication is sent in for presumed urinary tract infection nitrofurantoin take as directed.  Ensure you drink at least 64 ounces of water daily and empty often.  Please get your covid booster shots, past due  Please reschedule mammogram patient missed her last appointment she states any days good midmorning to early afternoon except Wednesdays.  Recent labs show excellent cholesterol and liver function.  Your blood sugar is uncontrolled I am referring you back to the local endocrinologist per your request and it is important that you follow-up up with treatment plan.  Kidney function is decreased   You have chronic kidney disease and you are being referred to the local nephrologist Dr. Theador Hawthorne for management.    Your iron levels are low and you are mildly anemic start over-the-counter iron 325 mg 1 twice daily

## 2021-04-13 NOTE — Telephone Encounter (Signed)
Pt coming in for OV today.

## 2021-04-13 NOTE — Assessment & Plan Note (Signed)
Deteriorated and uncontrolled Laura Atkins is reminded of the importance of commitment to daily physical activity for 30 minutes or more, as able and the need to limit carbohydrate intake to 30 to 60 grams per meal to help with blood sugar control.   The need to take medication as prescribed, test blood sugar as directed, and to call between visits if there is a concern that blood sugar is uncontrolled is also discussed.   Laura Atkins is reminded of the importance of daily foot exam, annual eye examination, and good blood sugar, blood pressure and cholesterol control.  Diabetic Labs Latest Ref Rng & Units 04/07/2021 11/17/2020 06/30/2020 06/03/2020 06/02/2020  HbA1c 4.8 - 5.6 % 10.0(H) 9.5(A) 8.3(A) - -  Microalbumin mg/dL - - - - -  Micro/Creat Ratio 0.0 - 30.0 mg/g creat - - - - -  Chol 100 - 199 mg/dL 144 - - - -  HDL >39 mg/dL 58 - - - -  Calc LDL 0 - 99 mg/dL 66 - - - -  Triglycerides 0 - 149 mg/dL 111 - - - -  Creatinine 0.57 - 1.00 mg/dL 1.55(H) - - 1.13(H) 1.17(H)   BP/Weight 04/13/2021 03/15/2021 02/21/2021 01/31/2021 01/26/2021 [id number]  Systolic BP 123456 - 123456 Q000111Q Q000111Q [id number]  Diastolic BP 77 - 75 74 78 74 76  Wt. (Lbs) 256.4 - 264 264 264 - 246  BMI 38.99 - 40.14 40.14 40.14 - 37.4  Some encounter information is confidential and restricted. Go to Review Flowsheets activity to see all data.   Foot/eye exam completion dates Latest Ref Rng & Units 02/15/2021 11/17/2020  Eye Exam No Retinopathy Retinopathy(A) -  Foot Form Completion - - Done      Refer local endo

## 2021-04-14 ENCOUNTER — Other Ambulatory Visit: Payer: Self-pay

## 2021-04-14 ENCOUNTER — Telehealth: Payer: Self-pay | Admitting: Family Medicine

## 2021-04-14 DIAGNOSIS — N3001 Acute cystitis with hematuria: Secondary | ICD-10-CM

## 2021-04-14 DIAGNOSIS — R32 Unspecified urinary incontinence: Secondary | ICD-10-CM

## 2021-04-14 MED ORDER — NITROFURANTOIN MONOHYD MACRO 100 MG PO CAPS
100.0000 mg | ORAL_CAPSULE | Freq: Two times a day (BID) | ORAL | 0 refills | Status: DC
Start: 2021-04-14 — End: 2021-05-18

## 2021-04-14 NOTE — Telephone Encounter (Signed)
Left message that rx has been re-sent.

## 2021-04-14 NOTE — Telephone Encounter (Signed)
Pt called in stating that she has yet to receive this med nitrofurantoin, macrocrystal-monohydrate, (MACROBID) 100 MG capsule  Pt stated that she was ordered this med for an infection and would like to get the prescription asap. Please advise   Cb#: [phone]

## 2021-04-16 ENCOUNTER — Encounter: Payer: Self-pay | Admitting: Family Medicine

## 2021-04-16 DIAGNOSIS — N3001 Acute cystitis with hematuria: Secondary | ICD-10-CM | POA: Insufficient documentation

## 2021-04-16 NOTE — Assessment & Plan Note (Signed)
Hyperlipidemia:Low fat diet discussed and encouraged.   Lipid Panel  Lab Results  Component Value Date   CHOL 144 04/07/2021   HDL 58 04/07/2021   LDLCALC 66 04/07/2021   TRIG 111 04/07/2021   CHOLHDL 2.6 12/01/2019  Controlled, no change in medication

## 2021-04-16 NOTE — Assessment & Plan Note (Signed)
Home safety and fall risk reduction discussed 

## 2021-04-16 NOTE — Assessment & Plan Note (Signed)
Symptomatic with abn CCUA, nitrofurantoin prescribed and will f/u c/S

## 2021-04-16 NOTE — Progress Notes (Signed)
Laura Atkins     MRN: [id number]      DOB: July 11, 1941   HPI Laura Atkins is here  with a 1 week  h/o dysuria and frequency, denies fever, chills , nausea or flank pain , has h/o recurrent  UTI C/O uncontrolled blood sugar, wants to re establish with local Endo Preventive health is updated, specifically  Cancer screening and Immunization.   Questions or concerns regarding consultations or procedures which the PT has had in the interim are  addressed. The PT denies any adverse reactions to current medications since the last visit.  ROS Denies recent fever or chills. Denies sinus pressure, nasal congestion, ear pain or sore throat. Denies chest congestion, productive cough or wheezing. Denies chest pains, palpitations and leg swelling Denies abdominal pain, nausea, vomiting,diarrhea or constipation.    Chronic  joint pain, swelling and limitation in mobility. Denies headaches, seizures, numbness, or tingling. Denies uncontrolled depression, anxiety or insomnia. Denies skin break down or rash.   PE  BP 120/77   Pulse 90   Resp 15   Ht '5\' 8"'$  (1.727 m)   Wt 256 lb 6.4 oz (116.3 kg)   SpO2 96%   BMI 38.99 kg/m   Patient alert and oriented and in no cardiopulmonary distress.  HEENT: No facial asymmetry, EOMI,     Neck supple .  Chest: Clear to auscultation bilaterally.  CVS: S1, S2 no murmurs, no S3.Regular rate.  ABD: Soft non tender. No renal angle tenderness  Ext: No edema  [id number]  ROM spine, shoulders, hips and knees.  Skin: Intact, no ulcerations or rash noted.  Psych: Good eye contact, normal affect. Memory intact not anxious or depressed appearing.  CNS: CN 2-12 intact, power,  normal throughout.no focal deficits noted.   Assessment & Plan  Chronic kidney disease, stage 4 (severe) (HCC) Refer nephrology  Type 2 diabetes mellitus with hyperglycemia ([place]) Deteriorated and uncontrolled Laura Atkins is reminded of the importance of commitment to daily  physical activity for 30 minutes or more, as able and the need to limit carbohydrate intake to 30 to 60 grams per meal to help with blood sugar control.   The need to take medication as prescribed, test blood sugar as directed, and to call between visits if there is a concern that blood sugar is uncontrolled is also discussed.   Laura Atkins is reminded of the importance of daily foot exam, annual eye examination, and good blood sugar, blood pressure and cholesterol control.  Diabetic Labs Latest Ref Rng & Units 04/07/2021 11/17/2020 06/30/2020 06/03/2020 06/02/2020  HbA1c 4.8 - 5.6 % 10.0(H) 9.5(A) 8.3(A) - -  Microalbumin mg/dL - - - - -  Micro/Creat Ratio 0.0 - 30.0 mg/g creat - - - - -  Chol 100 - 199 mg/dL 144 - - - -  HDL >39 mg/dL 58 - - - -  Calc LDL 0 - 99 mg/dL 66 - - - -  Triglycerides 0 - 149 mg/dL 111 - - - -  Creatinine 0.57 - 1.00 mg/dL 1.55(H) - - 1.13(H) 1.17(H)   BP/Weight 04/13/2021 03/15/2021 02/21/2021 01/31/2021 01/26/2021 Q000111Q [id number]  Systolic BP 123456 - 123456 Q000111Q Q000111Q [id number]  Diastolic BP 77 - 75 74 78 74 76  Wt. (Lbs) 256.4 - 264 264 264 - 246  BMI 38.99 - 40.14 40.14 40.14 - 37.4  Some encounter information is confidential and restricted. Go to Review Flowsheets activity to see all data.   Foot/eye exam completion dates Latest  Ref Rng & Units 02/15/2021 11/17/2020  Eye Exam No Retinopathy Retinopathy(A) -  Foot Form Completion - - Done      Refer local endo  Acute cystitis with hematuria Symptomatic with abn CCUA, nitrofurantoin prescribed and will f/u c/S  Morbid obesity ([place])  Patient re-educated about  the importance of commitment to a  minimum of 150 minutes of exercise per week as able.  The importance of healthy food choices with portion control discussed, as well as eating regularly and within a 12 hour window most days. The need to choose "clean , green" food 50 to 75% of the time is discussed, as well as to make water the primary drink and set a goal of 64  ounces water daily.    Weight /BMI 04/13/2021 03/15/2021 02/21/2021  WEIGHT 256 lb 6.4 oz - 264 lb  HEIGHT '5\' 8"'$  - '5\' 8"'$   BMI 38.99 kg/m2 - 40.14 kg/m2  Some encounter information is confidential and restricted. Go to Review Flowsheets activity to see all data.      Limited mobility Home safety and fall risk reduction discussed  Iron deficiency Start  daily OTC iron 325 mg  Essential hypertension Controlled, no change in medication DASH diet and commitment to daily physical activity for a minimum of 30 minutes discussed and encouraged, as a part of hypertension management. The importance of attaining a healthy weight is also discussed.  BP/Weight 04/13/2021 03/15/2021 02/21/2021 01/31/2021 01/26/2021 Q000111Q [id number]  Systolic BP 123456 - 123456 Q000111Q Q000111Q [id number]  Diastolic BP 77 - 75 74 78 74 76  Wt. (Lbs) 256.4 - 264 264 264 - 246  BMI 38.99 - 40.14 40.14 40.14 - 37.4  Some encounter information is confidential and restricted. Go to Review Flowsheets activity to see all data.       Hyperlipidemia associated with type 2 diabetes mellitus ([place]) Hyperlipidemia:Low fat diet discussed and encouraged.   Lipid Panel  Lab Results  Component Value Date   CHOL 144 04/07/2021   HDL 58 04/07/2021   LDLCALC 66 04/07/2021   TRIG 111 04/07/2021   CHOLHDL 2.6 12/01/2019  Controlled, no change in medication

## 2021-04-16 NOTE — Assessment & Plan Note (Signed)
Start  daily OTC iron 325 mg

## 2021-04-16 NOTE — Assessment & Plan Note (Signed)
Controlled, no change in medication DASH diet and commitment to daily physical activity for a minimum of 30 minutes discussed and encouraged, as a part of hypertension management. The importance of attaining a healthy weight is also discussed.  BP/Weight 04/13/2021 03/15/2021 02/21/2021 01/31/2021 01/26/2021 Q000111Q [id number]  Systolic BP 123456 - 123456 Q000111Q Q000111Q [id number]  Diastolic BP 77 - 75 74 78 74 76  Wt. (Lbs) 256.4 - 264 264 264 - 246  BMI 38.99 - 40.14 40.14 40.14 - 37.4  Some encounter information is confidential and restricted. Go to Review Flowsheets activity to see all data.

## 2021-04-16 NOTE — Assessment & Plan Note (Signed)
  Patient re-educated about  the importance of commitment to a  minimum of 150 minutes of exercise per week as able.  The importance of healthy food choices with portion control discussed, as well as eating regularly and within a 12 hour window most days. The need to choose "clean , green" food 50 to 75% of the time is discussed, as well as to make water the primary drink and set a goal of 64 ounces water daily.    Weight /BMI 04/13/2021 03/15/2021 02/21/2021  WEIGHT 256 lb 6.4 oz - 264 lb  HEIGHT '5\' 8"'$  - '5\' 8"'$   BMI 38.99 kg/m2 - 40.14 kg/m2  Some encounter information is confidential and restricted. Go to Review Flowsheets activity to see all data.

## 2021-04-17 ENCOUNTER — Ambulatory Visit (HOSPITAL_COMMUNITY): Payer: Medicare HMO

## 2021-04-17 ENCOUNTER — Other Ambulatory Visit: Payer: Self-pay

## 2021-04-17 ENCOUNTER — Encounter (HOSPITAL_COMMUNITY): Payer: Self-pay

## 2021-04-17 DIAGNOSIS — G8929 Other chronic pain: Secondary | ICD-10-CM

## 2021-04-17 DIAGNOSIS — M6281 Muscle weakness (generalized): Secondary | ICD-10-CM

## 2021-04-17 DIAGNOSIS — R262 Difficulty in walking, not elsewhere classified: Secondary | ICD-10-CM | POA: Diagnosis not present

## 2021-04-17 DIAGNOSIS — M24562 Contracture, left knee: Secondary | ICD-10-CM

## 2021-04-17 DIAGNOSIS — R2689 Other abnormalities of gait and mobility: Secondary | ICD-10-CM | POA: Diagnosis not present

## 2021-04-17 DIAGNOSIS — M25562 Pain in left knee: Secondary | ICD-10-CM | POA: Diagnosis not present

## 2021-04-17 LAB — URINE CULTURE

## 2021-04-17 NOTE — Therapy (Signed)
[place], [place], [address] Phone: [phone]   Fax:  [phone]  Physical Therapy Treatment  Patient Details  Name: Laura Atkins MRN: [id number] Date of Birth: 01-17-1941 Referring Provider (PT): Tula Nakayama   Encounter Date: 04/17/2021   PT End of Session - 04/17/21 0955     Visit Number 9    Number of Visits 12    Date for PT Re-Evaluation 04/19/21    Authorization Type [employer] H[employer]    Authorization Time Period [place] 12 visits approved from 6/15-->04/16/21    Authorization - Visit Number 9    Authorization - Number of Visits 12    Progress Note Due on Visit 10    PT Start Time 0945    PT Stop Time 1030    PT Time Calculation (min) 45 min    Equipment Utilized During Treatment Gait belt    Activity Tolerance Patient tolerated treatment well;Patient limited by fatigue    Behavior During Therapy [facility] for tasks assessed/performed             Past Medical History:  Diagnosis Date   Alpha thalassemia trait 01/26/2010   02/2012: Nl CBC ex H&H-10.7/34.8, MCV-69    Anemia    Anxiety    Anxiety and depression    Cellulitis 05/09/2017   Depression    Diabetes mellitus    Foot pain, right 04/30/2013   GERD (gastroesophageal reflux disease)    Headache(784.0)    Hyperlipidemia    Hypertension    Iron deficiency 01/21/2017   Microcytic anemia 01/26/2010   02/2012: Nl CBC ex H&H-10.7/34.8, MCV-69    NECK PAIN, CHRONIC 10/21/2008   +chronic back pain     Obesity    Obstructive sleep apnea    Osteoarthritis    Left knee; right shoulder; chronic neck and back pain   Pruritus    PVD (peripheral vascular disease) ([place]) 01/28/2014   Seizures (HCC)    Shoulder pain, right 04/14/2015   Tremor    This started months ago after her seizure progressing to very poor hand writing   Urinary incontinence    UTI (urinary tract infection) 01/18/2013    Past Surgical History:  Procedure Laterality Date    ABDOMINAL HYSTERECTOMY     BREAST EXCISIONAL BIOPSY     Left; cyst   CATARACT EXTRACTION Right    12/2017   CATARACT EXTRACTION W/ INTRAOCULAR LENS IMPLANT Left 09/07/2013   CHOLECYSTECTOMY     COLONOSCOPY     COLONOSCOPY N/A 07/20/2015   Procedure: COLONOSCOPY;  Surgeon: Rogene Houston, MD;  Location: [facility];  Service: Endoscopy;  Laterality: N/A;  930   EYE SURGERY Left 09/07/2013   cataract    There were no vitals filed for this visit.   Subjective Assessment - 04/17/21 0954     Subjective Pt admits to inactivity over the weekend. "I usually just stay in the bed"    Pertinent History HTN, DM, AKI seizure like activity.    Currently in Pain? Yes    Pain Score 4     Pain Location Knee    Pain Orientation Left    Pain Descriptors / Indicators Aching;Sore    Pain Type Chronic pain                [facility] PT Assessment - 04/17/21 0001       Assessment   Medical Diagnosis Primary osteoarthritis of left knee M17.12  Referring Provider (PT) Tula Nakayama                           [facility] Adult PT Treatment/Exercise - 04/17/21 0001       Transfers   Transfers Sit to Stand    Sit to Stand 4: Min guard    Transfer Cueing cues for flexion over BOS and use of momentum      Knee/Hip Exercises: Aerobic   Nustep level 3 x 6 min for dynamic warm-up      Knee/Hip Exercises: Machines for Strengthening   Cybex Knee Flexion 4.5 plates 4x10      Knee/Hip Exercises: Standing   Gait Training sidestepping x 2 min    Other Standing Knee Exercises stair taps 6" step x 2 min    Other Standing Knee Exercises static standing feet together/apart eyes open/closed 3x15 sec                      PT Short Term Goals - 03/29/21 1142       PT SHORT TERM GOAL #1   Title Patient will independently verbalize and demo initial HEP in order to improve B LE strength with ambulation and transfers.     Baseline poor compliance to date    Time 3    Period  Weeks    Status On-going    Target Date 04/12/21      PT SHORT TERM GOAL #2   Title Patient will demo functional transfers with supervision to reduce burden of care    Baseline CGA-min A    Time 3    Period Weeks    Status On-going    Target Date 04/12/21      PT SHORT TERM GOAL #3   Title PT  will be able to ambulate 146f with a RW in 2 minute time period to demonstrate reduce risk of falling.    Baseline 54 ft w/ CGA    Time 3    Period Weeks    Status On-going    Target Date 03/29/21      PT SHORT TERM GOAL #4   Title Decrease risk for falls per TUG test time of 30 sec    Baseline 75 sec with RW at start of care. 112 seconds with RW on this date    Time 3    Period Weeks    Status On-going    Target Date 04/12/21               PT Long Term Goals - 04/03/21 1021       PT LONG TERM GOAL #1   Title Demo functional transfers with modified independence to reduce caregiver assistance    Baseline max A; CLOF CGA-min A    Time 6    Period Weeks    Status On-going      PT LONG TERM GOAL #2   Title Decrease risk for falls per time of 15 sec TUG test    Baseline 75 sec with RW    Time 6    Period Weeks    Status On-going      PT LONG TERM GOAL #3   Title Patient will improve FOTO score by at least 10 points in order to indicate improved tolerance to activity.    Baseline 18% function    Time 6    Period Weeks    Status New  Plan - 04/17/21 1003     Clinical Impression Statement Continues to require cues for trunk flexion over BOS for sit to stand transfers. Pt admits to inactivity at home and this has likely hindered any significant functional gains as it seems the only time she is physically active is when she comes for therapy sessions. Stressed importance of regular physical activity at home and encouraged to perform seated ther ex routine. Continued sessions indicated to refine HEP to hopefully improve compliance and encourage more  physical activity.    Personal Factors and Comorbidities Fitness;Past/Current Experience;Comorbidity 3+;Time since onset of injury/illness/exacerbation    Comorbidities HTN, seizure like activity, DM, memory issues    Examination-Activity Limitations Caring for Others;Carry;Dressing;Lift;Locomotion Level;Stairs;Stand;Squat;Transfers    Examination-Participation Restrictions Cleaning;Laundry;Meal Prep;Shop;Church    Stability/Clinical Decision Making Evolving/Moderate complexity    Rehab Potential Fair    PT Frequency 2x / week    PT Duration 6 weeks    PT Treatment/Interventions Gait training;Stair training;Functional mobility training;Therapeutic activities;Therapeutic exercise;Balance training;Neuromuscular re-education;Patient/family education;ADLs/Self Care Home Management;DME Instruction;Traction;Manual techniques;Passive range of motion;Dry needling    PT Next Visit Plan Progress Note, recertification    PT Home Exercise Plan QS, heel slides, sit to stand from EOB    Consulted and Agree with Plan of Care Patient;Family member/caregiver    Family Member Consulted Husband             Patient will benefit from skilled therapeutic intervention in order to improve the following deficits and impairments:  Abnormal gait, Decreased activity tolerance, Decreased balance, Decreased endurance, Decreased mobility, Decreased range of motion, Difficulty walking, Decreased strength, Pain, Decreased knowledge of use of DME, Improper body mechanics, Obesity  Visit Diagnosis: Chronic pain of left knee  Muscle weakness (generalized)  Difficulty in walking, not elsewhere classified  Other abnormalities of gait and mobility  Flexion contracture of left knee     Problem List Patient Active Problem List   Diagnosis Date Noted   Acute cystitis with hematuria 04/16/2021   Pressure injury of right heel, unstageable ([place]) 01/06/2021   Polyneuropathy associated with underlying disease ([place])  01/06/2021   Chronic kidney disease, stage 4 (severe) ([place]) 01/06/2021   Dementia ([place]) 07/13/2020   Cognitive deficits    Carotid bruit 05/26/2020   Hyperlipidemia associated with type 2 diabetes mellitus ([place]) 12/08/2019   Left knee pain 02/11/2017   Iron deficiency 01/21/2017   Vitamin D deficiency 10/15/2016   Urinary incontinence 10/15/2016   Limited mobility 05/27/2016   Type 2 diabetes mellitus with hyperglycemia ([place]) 02/04/2016   Sleep terror disorder 12/14/2015   Fecal incontinence 12/13/2015   Back pain of lumbar region with sciatica 04/15/2015   Spondylosis, cervical, with myelopathy 04/14/2015   OSA (obstructive sleep apnea) 04/10/2013   Peripheral neuropathy 03/12/2013   Hearing loss 11/06/2012   Seizure-like activity ([place]) 01/25/2012   UNSTEADY GAIT 11/07/2010   Alpha thalassemia ([place]) 01/26/2010   Morbid obesity ([place]) 04/21/2008   Anxiety and depression 04/21/2008   Essential hypertension 04/21/2008   GERD 04/21/2008   Osteoarthritis of left knee 04/21/2008   10:35 AM, 04/17/21 M. Sherlyn Lees, PT, DPT Physical Therapist-  Office Number: [phone]   [place] [address] [place], [place], [address] Phone: [phone]   Fax:  [phone]  Name: Laura Atkins MRN: [id number] Date of Birth: 1941-09-24

## 2021-04-18 ENCOUNTER — Other Ambulatory Visit: Payer: Self-pay

## 2021-04-18 ENCOUNTER — Ambulatory Visit: Payer: Medicare HMO | Admitting: Family Medicine

## 2021-04-18 ENCOUNTER — Telehealth: Payer: Self-pay

## 2021-04-18 DIAGNOSIS — M19022 Primary osteoarthritis, left elbow: Secondary | ICD-10-CM

## 2021-04-18 NOTE — Telephone Encounter (Signed)
Patient called said the therapist ask her to mention to Dr Moshe Cipro she needs more time with [place] with her shoulder.  Please return patient call [phone].

## 2021-04-18 NOTE — Telephone Encounter (Signed)
Order sent.

## 2021-04-19 ENCOUNTER — Other Ambulatory Visit: Payer: Self-pay

## 2021-04-19 ENCOUNTER — Ambulatory Visit (HOSPITAL_COMMUNITY): Payer: Medicare HMO

## 2021-04-19 ENCOUNTER — Telehealth: Payer: Self-pay | Admitting: Family Medicine

## 2021-04-19 DIAGNOSIS — M6281 Muscle weakness (generalized): Secondary | ICD-10-CM

## 2021-04-19 DIAGNOSIS — M24562 Contracture, left knee: Secondary | ICD-10-CM

## 2021-04-19 DIAGNOSIS — R2689 Other abnormalities of gait and mobility: Secondary | ICD-10-CM

## 2021-04-19 DIAGNOSIS — R262 Difficulty in walking, not elsewhere classified: Secondary | ICD-10-CM | POA: Diagnosis not present

## 2021-04-19 DIAGNOSIS — G8929 Other chronic pain: Secondary | ICD-10-CM | POA: Diagnosis not present

## 2021-04-19 DIAGNOSIS — M25562 Pain in left knee: Secondary | ICD-10-CM | POA: Diagnosis not present

## 2021-04-19 NOTE — Therapy (Signed)
[facility] [place], [place], [address] Phone: [phone]   Fax:  [phone]  Physical Therapy Treatment, Progress Note, and Recertification  Patient Details  Name: Laura Atkins MRN: [id number] Date of Birth: 1941/03/29 Referring Provider (PT): Tula Nakayama  Progress Note Reporting Period 03/08/21 to 04/19/21  See note below for Objective Data and Assessment of Progress/Goals.     Encounter Date: 04/19/2021   PT End of Session - 04/19/21 0949     Visit Number 10    Number of Visits 12    Date for PT Re-Evaluation 05/03/21    Authorization Type [employer] H[employer]    Authorization Time Period [place] 12 visits approved from 6/15-->04/16/21    Authorization - Visit Number 10    Authorization - Number of Visits 12    Progress Note Due on Visit 20    PT Start Time 0945    PT Stop Time 1030    PT Time Calculation (min) 45 min    Equipment Utilized During Treatment Gait belt    Activity Tolerance Patient tolerated treatment well;Patient limited by fatigue    Behavior During Therapy WFL for tasks assessed/performed             Past Medical History:  Diagnosis Date   Alpha thalassemia trait 01/26/2010   02/2012: Nl CBC ex H&H-10.7/34.8, MCV-69    Anemia    Anxiety    Anxiety and depression    Cellulitis 05/09/2017   Depression    Diabetes mellitus    Foot pain, right 04/30/2013   GERD (gastroesophageal reflux disease)    Headache(784.0)    Hyperlipidemia    Hypertension    Iron deficiency 01/21/2017   Microcytic anemia 01/26/2010   02/2012: Nl CBC ex H&H-10.7/34.8, MCV-69    NECK PAIN, CHRONIC 10/21/2008   +chronic back pain     Obesity    Obstructive sleep apnea    Osteoarthritis    Left knee; right shoulder; chronic neck and back pain   Pruritus    PVD (peripheral vascular disease) ([place]) 01/28/2014   Seizures (HCC)    Shoulder pain, right 04/14/2015   Tremor    This started months ago after her seizure  progressing to very poor hand writing   Urinary incontinence    UTI (urinary tract infection) 01/18/2013    Past Surgical History:  Procedure Laterality Date   ABDOMINAL HYSTERECTOMY     BREAST EXCISIONAL BIOPSY     Left; cyst   CATARACT EXTRACTION Right    12/2017   CATARACT EXTRACTION W/ INTRAOCULAR LENS IMPLANT Left 09/07/2013   CHOLECYSTECTOMY     COLONOSCOPY     COLONOSCOPY N/A 07/20/2015   Procedure: COLONOSCOPY;  Surgeon: Rogene Houston, MD;  [facility];  Service: Endoscopy;  Laterality: N/A;  930   EYE SURGERY Left 09/07/2013   cataract    There were no vitals filed for this visit.   Subjective Assessment - 04/19/21 0959     Subjective "About the same, no change"    Pertinent History HTN, DM, AKI seizure like activity.                [facility] PT Assessment - 04/19/21 0001       Assessment   Medical Diagnosis Primary osteoarthritis of left knee M17.12    Referring Provider (PT) Tula Nakayama      Transfers   Transfers Sit to Stand    Sit to Stand 4: Min  guard      Ambulation/Gait   Ambulation/Gait Yes    Ambulation/Gait Assistance 5: Supervision    Ambulation Distance (Feet) 66 Feet    Assistive device Rolling walker    Gait Pattern Step-through pattern;Right flexed knee in stance;Left flexed knee in stance    Ambulation Surface Level;Indoor    Gait velocity decreased    Gait Comments 2MWT      Timed Up and Go Test   Normal TUG (seconds) 59   w/ RW                          [facility] Adult PT Treatment/Exercise - 04/19/21 0001       Knee/Hip Exercises: Aerobic   Nustep level 3 x 6 min for dynamic warm-up                    PT Education - 04/19/21 1019     Education Details Emphasized benefits of regular physical activity at home to improve functional status    Person(s) Educated Patient;Spouse    Methods Explanation    Comprehension Verbalized understanding              PT Short Term Goals -  04/19/21 1006       PT SHORT TERM GOAL #1   Title Patient will independently verbalize and demo initial HEP in order to improve B LE strength with ambulation and transfers.     Baseline poor compliance to date    Time 3    Period Weeks    Status On-going    Target Date 05/03/21      PT SHORT TERM GOAL #2   Title Patient will demo functional transfers with supervision to reduce burden of care    Baseline CGA-min A    Time 3    Period Weeks    Status On-going    Target Date 05/03/21      PT SHORT TERM GOAL #3   Title PT  will be able to ambulate 162f with a RW in 2 minute time period to demonstrate reduce risk of falling.    Baseline 66 ft with supervision using RW    Time 3    Period Weeks    Status On-going    Target Date 03/29/21      PT SHORT TERM GOAL #4   Title Decrease risk for falls per TUG test time of 30 sec    Baseline 59 sec 04/19/21    Time 3    Period Weeks    Status On-going    Target Date 05/03/21               PT Long Term Goals - 04/19/21 1014       PT LONG TERM GOAL #1   Title Demo functional transfers with modified independence to reduce caregiver assistance    Baseline CGA-min A    Time 6    Period Weeks    Status On-going    Target Date 05/03/21      PT LONG TERM GOAL #2   Title Decrease risk for falls per time of 15 sec TUG test    Baseline 59 sec with RW    Time 6    Period Weeks    Status On-going    Target Date 05/03/21      PT LONG TERM GOAL #3   Title Patient will improve FOTO score by at least 10 points in  order to indicate improved tolerance to activity.    Baseline 18% function    Time 6    Period Weeks    Status New                   Plan - 04/19/21 1015     Clinical Impression Statement Re-assessment of STG/LTG on this date. Demonstrates improved gait velocity as evidenced by TUG test and 2MWT and less unsteadiness appreciated as ambulation iwth supervision vs CGA on level surfaces. Demonstrates improved  transfer capabilities requiring less physical assistance. Continues to ambulate with slow, but steady pattern. Pt remains non-compliant with HEP and activity level at home remains very low with patient and spouse endorsing the fact she stays in bed or sitting in recliner chair most of the day. Pt has been having left shoulder issues and will be assessed by OT for this issue.    Personal Factors and Comorbidities Fitness;Past/Current Experience;Comorbidity 3+;Time since onset of injury/illness/exacerbation    Comorbidities HTN, seizure like activity, DM, memory issues    Examination-Activity Limitations Caring for Others;Carry;Dressing;Lift;Locomotion Level;Stairs;Stand;Squat;Transfers    Examination-Participation Restrictions Cleaning;Laundry;Meal Prep;Shop;Church    Stability/Clinical Decision Making Evolving/Moderate complexity    Rehab Potential Fair    PT Frequency 2x / week    PT Duration 6 weeks    PT Treatment/Interventions Gait training;Stair training;Functional mobility training;Therapeutic activities;Therapeutic exercise;Balance training;Neuromuscular re-education;Patient/family education;ADLs/Self Care Home Management;DME Instruction;Traction;Manual techniques;Passive range of motion;Dry needling    PT Next Visit Plan Progress Note, recertification    PT Home Exercise Plan QS, heel slides, sit to stand from EOB    Consulted and Agree with Plan of Care Patient;Family member/caregiver    Family Member Consulted Husband             Patient will benefit from skilled therapeutic intervention in order to improve the following deficits and impairments:  Abnormal gait, Decreased activity tolerance, Decreased balance, Decreased endurance, Decreased mobility, Decreased range of motion, Difficulty walking, Decreased strength, Pain, Decreased knowledge of use of DME, Improper body mechanics, Obesity  Visit Diagnosis: Chronic pain of left knee  Muscle weakness (generalized)  Difficulty in  walking, not elsewhere classified  Other abnormalities of gait and mobility  Flexion contracture of left knee     Problem List Patient Active Problem List   Diagnosis Date Noted   Acute cystitis with hematuria 04/16/2021   Pressure injury of right heel, unstageable ([place]) 01/06/2021   Polyneuropathy associated with underlying disease ([place]) 01/06/2021   Chronic kidney disease, stage 4 (severe) ([place]) 01/06/2021   Dementia ([place]) 07/13/2020   Cognitive deficits    Carotid bruit 05/26/2020   Hyperlipidemia associated with type 2 diabetes mellitus ([place]) 12/08/2019   Left knee pain 02/11/2017   Iron deficiency 01/21/2017   Vitamin D deficiency 10/15/2016   Urinary incontinence 10/15/2016   Limited mobility 05/27/2016   Type 2 diabetes mellitus with hyperglycemia ([place]) 02/04/2016   Sleep terror disorder 12/14/2015   Fecal incontinence 12/13/2015   Back pain of lumbar region with sciatica 04/15/2015   Spondylosis, cervical, with myelopathy 04/14/2015   OSA (obstructive sleep apnea) 04/10/2013   Peripheral neuropathy 03/12/2013   Hearing loss 11/06/2012   Seizure-like activity ([place]) 01/25/2012   UNSTEADY GAIT 11/07/2010   Alpha thalassemia ([place]) 01/26/2010   Morbid obesity ([place]) 04/21/2008   Anxiety and depression 04/21/2008   Essential hypertension 04/21/2008   GERD 04/21/2008   Osteoarthritis of left knee 04/21/2008   10:23 AM, 04/19/21 M. Sherlyn Lees, PT, DPT Physical Therapist- [facility]  Office Number: [place], [place], [address] Phone: [phone]   Fax:  [phone]  Name: SHAKERAH CAGE MRN: [id number] Date of Birth: April 05, 1941

## 2021-04-19 NOTE — Telephone Encounter (Signed)
Please call ot\pt regarding pt therapy [phone] (Crystal)

## 2021-04-19 NOTE — Telephone Encounter (Signed)
Left message for Crystal.

## 2021-04-20 ENCOUNTER — Ambulatory Visit (HOSPITAL_COMMUNITY): Payer: Medicare HMO

## 2021-04-21 ENCOUNTER — Telehealth: Payer: Self-pay | Admitting: Family Medicine

## 2021-04-21 NOTE — Telephone Encounter (Signed)
Patient called back again wanting to speak with Brandi.  I advised her she is seeing patients right now.  She wants her to call her before she leaves today.

## 2021-04-21 NOTE — Telephone Encounter (Signed)
Laura Atkins she just needs you to call her she doesn't want to tell me what she needs she doesn't know how to explain it   It just scares her about her bowels   Please call her back at [phone].

## 2021-04-21 NOTE — Telephone Encounter (Signed)
Stool was black. Advised her to go to urgent care to be checked since hemoglobin was very low when last checked

## 2021-04-24 NOTE — Telephone Encounter (Signed)
Laura Atkins called said she need to change order from PT to OT for shoulder. Needs a different referral for OT for the shoulder.   Laura Atkins call back # [phone]

## 2021-04-25 ENCOUNTER — Other Ambulatory Visit: Payer: Self-pay

## 2021-04-25 DIAGNOSIS — M19022 Primary osteoarthritis, left elbow: Secondary | ICD-10-CM

## 2021-04-25 NOTE — Telephone Encounter (Signed)
Order adjusted and sent to [facility] outpatient rehab

## 2021-04-26 ENCOUNTER — Telehealth: Payer: Self-pay

## 2021-04-26 NOTE — Telephone Encounter (Signed)
Pt informed

## 2021-04-26 NOTE — Telephone Encounter (Signed)
Pt would like to know if she is still being referred to Endocrinology?

## 2021-04-27 ENCOUNTER — Telehealth: Payer: Self-pay

## 2021-04-27 NOTE — Telephone Encounter (Signed)
Not sure what she is talking about. Her referral is to see Theador Hawthorne which I believe is already in [place]. Can you check on this please and ok to switch to whomever she wants to see

## 2021-04-27 NOTE — Telephone Encounter (Signed)
Patient called asked can she have her referral switch to [facility] to see Dr Lowanda Foster.

## 2021-05-01 ENCOUNTER — Ambulatory Visit (HOSPITAL_COMMUNITY): Payer: Medicare HMO | Attending: Family Medicine

## 2021-05-01 ENCOUNTER — Other Ambulatory Visit: Payer: Self-pay

## 2021-05-01 ENCOUNTER — Encounter (HOSPITAL_COMMUNITY): Payer: Self-pay

## 2021-05-01 DIAGNOSIS — M25612 Stiffness of left shoulder, not elsewhere classified: Secondary | ICD-10-CM | POA: Insufficient documentation

## 2021-05-01 DIAGNOSIS — R29898 Other symptoms and signs involving the musculoskeletal system: Secondary | ICD-10-CM | POA: Insufficient documentation

## 2021-05-01 DIAGNOSIS — G8929 Other chronic pain: Secondary | ICD-10-CM | POA: Diagnosis not present

## 2021-05-01 DIAGNOSIS — M25512 Pain in left shoulder: Secondary | ICD-10-CM | POA: Insufficient documentation

## 2021-05-01 NOTE — Therapy (Signed)
[facility] [address] [place], [place], [address] Phone: [phone]   Fax:  [phone]  Occupational Therapy Evaluation  Patient Details  Name: Laura Atkins MRN: [id number] Date of Birth: July 22, 1941 Referring Provider (OT): Tula Nakayama, MD   Encounter Date: 05/01/2021   OT End of Session - 05/01/21 1635     Visit Number 1    Number of Visits 12    Date for OT Re-Evaluation 06/12/21    Authorization Type [employer]    Authorization Time Period requestin 12 visits    Authorization - Visit Number 0    Authorization - Number of Visits 12    Progress Note Due on Visit 10    OT Start Time 1349    OT Stop Time 1430    OT Time Calculation (min) 41 min    Activity Tolerance Patient tolerated treatment well    Behavior During Therapy [facility] for tasks assessed/performed             Past Medical History:  Diagnosis Date   Alpha thalassemia trait 01/26/2010   02/2012: Nl CBC ex H&H-10.7/34.8, MCV-69    Anemia    Anxiety    Anxiety and depression    Cellulitis 05/09/2017   Depression    Diabetes mellitus    Foot pain, right 04/30/2013   GERD (gastroesophageal reflux disease)    Headache(784.0)    Hyperlipidemia    Hypertension    Iron deficiency 01/21/2017   Microcytic anemia 01/26/2010   02/2012: Nl CBC ex H&H-10.7/34.8, MCV-69    NECK PAIN, CHRONIC 10/21/2008   +chronic back pain     Obesity    Obstructive sleep apnea    Osteoarthritis    Left knee; right shoulder; chronic neck and back pain   Pruritus    PVD (peripheral vascular disease) ([place]) 01/28/2014   Seizures (HCC)    Shoulder pain, right 04/14/2015   Tremor    This started months ago after her seizure progressing to very poor hand writing   Urinary incontinence    UTI (urinary tract infection) 01/18/2013    Past Surgical History:  Procedure Laterality Date   ABDOMINAL HYSTERECTOMY     BREAST EXCISIONAL BIOPSY     Left; cyst   CATARACT EXTRACTION Right     12/2017   CATARACT EXTRACTION W/ INTRAOCULAR LENS IMPLANT Left 09/07/2013   CHOLECYSTECTOMY     COLONOSCOPY     COLONOSCOPY N/A 07/20/2015   Procedure: COLONOSCOPY;  Surgeon: Rogene Houston, MD;  [facility];  Service: Endoscopy;  Laterality: N/A;  930   EYE SURGERY Left 09/07/2013   cataract    There were no vitals filed for this visit.   Subjective Assessment - 05/01/21 1401     Subjective  S: I fell on this shoulder.    Patient is accompanied by: Family member   HusbandL Sauders   Pertinent History Patient is a 80 y/o female S/P left shoulder OA which flared up after a mechanical fall approximately 3 months ago. Dr. Moshe Cipro has referred patient to occupational therapy for evaluation and treatment.    Patient Stated Goals to have no pain and more use of her arm.    Currently in Pain? Yes    Pain Score 9     Pain Location Shoulder    Pain Orientation Left    Pain Descriptors / Indicators Constant;Aching    Pain Type Chronic pain    Pain Onset More than a  month ago    Pain Frequency Constant    Aggravating Factors  trying to move and use it.    Pain Relieving Factors pain cream, massage, heat    Effect of Pain on Daily Activities severe effect    Multiple Pain Sites No               [facility] OT Assessment - 05/01/21 1403       Assessment   Medical Diagnosis OA of left shoulder    Referring Provider (OT) Tula Nakayama, MD    Onset Date/Surgical Date --   3 months ago approximately   Hand Dominance Right    Next MD Visit 07/14/21    Prior Therapy receiving PT services currently for left knee OA      Precautions   Precautions Fall    Precaution Comments Uses RW for mobility.      Restrictions   Weight Bearing Restrictions No      Home  Environment   Family/patient expects to be discharged to: Private residence      Prior Function   Vocation Retired      ADL   ADL comments Difficulty using her left arm for any daily tasks. Unable to reach to  shoulder level.      Mobility   Mobility Status History of falls      Written Expression   Dominant Hand Right      Vision - History   Baseline Vision Wears glasses all the time      Observation/Other Assessments   Focus on Therapeutic Outcomes (FOTO)  N/A      Posture/Postural Control   Posture/Postural Control Postural limitations    Postural Limitations Rounded Shoulders;Forward head      ROM / Strength   AROM / PROM / Strength AROM;PROM;Strength      Palpation   Palpation comment max fascial restrictions in the left upper arm, upper trapezius, and scapularis region.      AROM   Overall AROM Comments Assessed seated. IR/er adducted    AROM Assessment Site Shoulder    Right/Left Shoulder Left    Left Shoulder Flexion 72 Degrees    Left Shoulder ABduction 40 Degrees    Left Shoulder Internal Rotation 90 Degrees    Left Shoulder External Rotation 0 Degrees      PROM   Overall PROM Comments Assessed supine. IR/er adducted    PROM Assessment Site Shoulder    Right/Left Shoulder Left    Left Shoulder Flexion 106 Degrees    Left Shoulder ABduction 100 Degrees    Left Shoulder Internal Rotation 90 Degrees    Left Shoulder External Rotation 60 Degrees      Strength   Overall Strength Comments Assessed seated. IR/er adducted    Strength Assessment Site Shoulder    Right/Left Shoulder Left    Left Shoulder Flexion 3-/5    Left Shoulder ABduction 3-/5    Left Shoulder Internal Rotation 3/5    Left Shoulder External Rotation 3-/5                             OT Education - 05/01/21 1421     Education Details table slides    Person(s) Educated Patient    Methods Explanation;Demonstration;Handout    Comprehension Verbalized understanding;Returned demonstration              OT Short Term Goals - 05/01/21 1644  OT SHORT TERM GOAL #1   Title Pt will be educated and verbalize independence with HEP in order to faciliate progress in therapy  and increase her ability to use her LUE for 50% of self care tasks.    Time 3    Period Weeks    Status New    Target Date 05/22/21      OT SHORT TERM GOAL #2   Title Patient will increase her LUE P/ROM to [facility] in order to increase her ability to complete upper body dressing and bathing tasks.    Time 3    Period Weeks    Status New      OT SHORT TERM GOAL #3   Title Patient will increase her LUE strength to 3/10 or better in order to pick up lightweight items and stabilize in both hands without difficulty.    Time 3    Period Weeks    Status New      OT SHORT TERM GOAL #4   Title Pt will report a pain level of 6/10 or less in her LUE while completing bathing and dressing tasks.    Time 3    Period Weeks    Status New               OT Long Term Goals - 05/01/21 1646       OT LONG TERM GOAL #1   Title Patient will utilize her LUE as her non dominant extremity for 75% or more of daily tasks.    Time 6    Period Weeks    Status New    Target Date 06/12/21      OT LONG TERM GOAL #2   Title Pt will increase her LUE strength to 4-/5 in order to complete basic meal prep and lifting of household items of moderate weight with less difficulty.    Time 6    Period Weeks    Status New      OT LONG TERM GOAL #3   Title Pt will increase her LUE A/ROM to [facility] in order to complete shoulder level reaching with less difficulty.    Time 6    Period Weeks    Status New      OT LONG TERM GOAL #4   Title Patient will report a decrease in LUE pain of approximately 4/10 or less when completing bathing and dressing tasks.    Time 6    Period Weeks    Status New                   Plan - 05/01/21 1639     Clinical Impression Statement A: Patient is a 80 y/o female S/P left shoulder OA causing increased pain, fascial restrictions and decreased ROM and strength resulting in difficulty using her left arm as her nondominant extremity.    OT Occupational Profile and History  Problem Focused Assessment - Including review of records relating to presenting problem    Occupational performance deficits (Please refer to evaluation for details): ADL's;Rest and Sleep;IADL's    Body Structure / Function / Physical Skills ADL;UE functional use;Fascial restriction;Pain;ROM;Strength    Rehab Potential Excellent    Clinical Decision Making Limited treatment options, no task modification necessary    Comorbidities Affecting Occupational Performance: May have comorbidities impacting occupational performance    Modification or Assistance to Complete Evaluation  No modification of tasks or assist necessary to complete eval    OT Frequency 2x / week  OT Treatment/Interventions Self-care/ADL training;Ultrasound;DME and/or AE instruction;Patient/family education;Passive range of motion;Cryotherapy;Electrical Stimulation;Moist Heat;Neuromuscular education;Therapeutic exercise;Manual Therapy;Therapeutic activities    Plan P: Pt will benefit from skilled OT services to increase functional use of her LUE as her non dominant extremity to complete all required basic self care tasks. Plan: myofascial release, manual stretching, P/ROM, AA/ROM, A/ROM, general strengthening. Modalities PRN. Next session: Complete UEFI.    OT Home Exercise Plan eval: table slides    Consulted and Agree with Plan of Care Patient             Patient will benefit from skilled therapeutic intervention in order to improve the following deficits and impairments:   Body Structure / Function / Physical Skills: ADL, UE functional use, Fascial restriction, Pain, ROM, Strength       Visit Diagnosis: Other symptoms and signs involving the musculoskeletal system - Plan: Ot plan of care cert/re-cert  Stiffness of left shoulder, not elsewhere classified - Plan: Ot plan of care cert/re-cert  Chronic left shoulder pain - Plan: Ot plan of care cert/re-cert    Problem List Patient Active Problem List   Diagnosis  Date Noted   Acute cystitis with hematuria 04/16/2021   Pressure injury of right heel, unstageable ([place]) 01/06/2021   Polyneuropathy associated with underlying disease ([place]) 01/06/2021   Chronic kidney disease, stage 4 (severe) ([place]) 01/06/2021   Dementia ([place]) 07/13/2020   Cognitive deficits    Carotid bruit 05/26/2020   Hyperlipidemia associated with type 2 diabetes mellitus ([place]) 12/08/2019   Left knee pain 02/11/2017   Iron deficiency 01/21/2017   Vitamin D deficiency 10/15/2016   Urinary incontinence 10/15/2016   Limited mobility 05/27/2016   Type 2 diabetes mellitus with hyperglycemia ([place]) 02/04/2016   Sleep terror disorder 12/14/2015   Fecal incontinence 12/13/2015   Back pain of lumbar region with sciatica 04/15/2015   Spondylosis, cervical, with myelopathy 04/14/2015   OSA (obstructive sleep apnea) 04/10/2013   Peripheral neuropathy 03/12/2013   Hearing loss 11/06/2012   Seizure-like activity ([place]) 01/25/2012   UNSTEADY GAIT 11/07/2010   Alpha thalassemia ([place]) 01/26/2010   Morbid obesity ([place]) 04/21/2008   Anxiety and depression 04/21/2008   Essential hypertension 04/21/2008   GERD 04/21/2008   Osteoarthritis of left knee 04/21/2008   Ailene Ravel, OTR/L,CBIS  [phone]  05/01/2021, 4:53 PM  [facility] [address] [place], [place], [address] Phone: [phone]   Fax:  [phone]  Name: Laura Atkins MRN: [id number] Date of Birth: 08-16-1941

## 2021-05-01 NOTE — Patient Instructions (Signed)
  Complete the following exercises 1-2 times a day. 10 repetitions each.  1) SHOULDER: Flexion On Table   Place hands on towel placed on table, elbows straight. Lean forward with you upper body, pushing towel away from body.  _10__ reps per set.  2) Abduction (Passive)   With arm out to side, resting on towel placed on table with palm DOWN, keeping trunk away from table, lean to the side while pushing towel away from body.  Repeat _10___ times.   Copyright  VHI. All rights reserved.     3) Internal Rotation (Assistive)   Seated with elbow bent at right angle and held against side, slide arm on table surface in an inward arc keeping elbow anchored in place. Repeat _10___ times.  Activity: Use this motion to brush crumbs off the table.  Copyright  VHI. All rights reserved.

## 2021-05-03 ENCOUNTER — Encounter (HOSPITAL_COMMUNITY): Payer: Self-pay

## 2021-05-03 ENCOUNTER — Ambulatory Visit (HOSPITAL_COMMUNITY): Payer: Medicare HMO

## 2021-05-03 ENCOUNTER — Other Ambulatory Visit: Payer: Self-pay

## 2021-05-03 DIAGNOSIS — G8929 Other chronic pain: Secondary | ICD-10-CM | POA: Diagnosis not present

## 2021-05-03 DIAGNOSIS — R29898 Other symptoms and signs involving the musculoskeletal system: Secondary | ICD-10-CM | POA: Diagnosis not present

## 2021-05-03 DIAGNOSIS — M25512 Pain in left shoulder: Secondary | ICD-10-CM | POA: Diagnosis not present

## 2021-05-03 DIAGNOSIS — M25612 Stiffness of left shoulder, not elsewhere classified: Secondary | ICD-10-CM

## 2021-05-03 NOTE — Therapy (Signed)
[facility] [address] [place], [place], [address] Phone: [phone]   Fax:  [phone]  Occupational Therapy Treatment  Patient Details  Name: Laura Atkins MRN: [id number] Date of Birth: 1941/06/12 Referring Provider (OT): Tula Nakayama, MD   Encounter Date: 05/03/2021   OT End of Session - 05/03/21 1105     Visit Number 2    Number of Visits 12    Date for OT Re-Evaluation 06/12/21    Authorization Type [employer]    Authorization Time Period 12 visits approved (05/01/21-06/12/21)    Authorization - Visit Number 2    Authorization - Number of Visits 12    Progress Note Due on Visit 10    OT Start Time 1038   pt arrived late   OT Stop Time 1113    OT Time Calculation (min) 35 min    Activity Tolerance Patient tolerated treatment well    Behavior During Therapy [facility] for tasks assessed/performed             Past Medical History:  Diagnosis Date   Alpha thalassemia trait 01/26/2010   02/2012: Nl CBC ex H&H-10.7/34.8, MCV-69    Anemia    Anxiety    Anxiety and depression    Cellulitis 05/09/2017   Depression    Diabetes mellitus    Foot pain, right 04/30/2013   GERD (gastroesophageal reflux disease)    Headache(784.0)    Hyperlipidemia    Hypertension    Iron deficiency 01/21/2017   Microcytic anemia 01/26/2010   02/2012: Nl CBC ex H&H-10.7/34.8, MCV-69    NECK PAIN, CHRONIC 10/21/2008   +chronic back pain     Obesity    Obstructive sleep apnea    Osteoarthritis    Left knee; right shoulder; chronic neck and back pain   Pruritus    PVD (peripheral vascular disease) ([place]) 01/28/2014   Seizures (HCC)    Shoulder pain, right 04/14/2015   Tremor    This started months ago after her seizure progressing to very poor hand writing   Urinary incontinence    UTI (urinary tract infection) 01/18/2013    Past Surgical History:  Procedure Laterality Date   ABDOMINAL HYSTERECTOMY     BREAST EXCISIONAL BIOPSY     Left; cyst    CATARACT EXTRACTION Right    12/2017   CATARACT EXTRACTION W/ INTRAOCULAR LENS IMPLANT Left 09/07/2013   CHOLECYSTECTOMY     COLONOSCOPY     COLONOSCOPY N/A 07/20/2015   Procedure: COLONOSCOPY;  Surgeon: Rogene Houston, MD;  [facility];  Service: Endoscopy;  Laterality: N/A;  930   EYE SURGERY Left 09/07/2013   cataract    There were no vitals filed for this visit.   Subjective Assessment - 05/03/21 1058     Subjective  S: I did my exercises yesterday and I rubbed it today.    Patient is accompanied by: Family member   Husband   Currently in Pain? Yes    Pain Score 5     Pain Location Shoulder    Pain Orientation Left    Pain Descriptors / Indicators Sore    Pain Type Chronic pain    Pain Onset More than a month ago    Pain Frequency Constant    Aggravating Factors  trying to move and use it.    Pain Relieving Factors pain cream, massage, heat    Effect of Pain on Daily Activities severe effect    Multiple  Pain Sites No                [facility] OT Assessment - 05/03/21 1100       Assessment   Medical Diagnosis OA of left shoulder      Precautions   Precautions Fall    Precaution Comments Uses RW for mobility.                      OT Treatments/Exercises (OP) - 05/03/21 1100       Exercises   Exercises Shoulder      Shoulder Exercises: Supine   Protraction PROM;AROM;10 reps    Horizontal ABduction PROM;10 reps    External Rotation PROM;10 reps    Internal Rotation PROM;10 reps    Flexion PROM;10 reps    ABduction PROM;10 reps    Diagonals PROM;10 reps      Shoulder Exercises: Therapy Ball   Flexion 10 reps      Manual Therapy   Manual Therapy Myofascial release    Manual therapy comments Manual therapy completed separate from exercises.    Myofascial Release Myofascial release and manual stretching completed to left upper arm and upper trapezius region to decrease fascial restrictions and increase joint mobility in a pain  free zone.                    OT Education - 05/03/21 1114     Education Details encouraged transitioning from sit to stand without pulling up on either arms to decrease any unnecessary stress and to encourage more independence.    Person(s) Educated Patient;Spouse    Methods Explanation    Comprehension Verbalized understanding              OT Short Term Goals - 05/03/21 1111       OT SHORT TERM GOAL #1   Title Pt will be educated and verbalize independence with HEP in order to faciliate progress in therapy and increase her ability to use her LUE for 50% of self care tasks.    Time 3    Period Weeks    Status On-going    Target Date 05/22/21      OT SHORT TERM GOAL #2   Title Patient will increase her LUE P/ROM to [facility] in order to increase her ability to complete upper body dressing and bathing tasks.    Time 3    Period Weeks    Status On-going      OT SHORT TERM GOAL #3   Title Patient will increase her LUE strength to 3/10 or better in order to pick up lightweight items and stabilize in both hands without difficulty.    Time 3    Period Weeks    Status On-going      OT SHORT TERM GOAL #4   Title Pt will report a pain level of 6/10 or less in her LUE while completing bathing and dressing tasks.    Time 3    Period Weeks    Status On-going               OT Long Term Goals - 05/03/21 1111       OT LONG TERM GOAL #1   Title Patient will utilize her LUE as her non dominant extremity for 75% or more of daily tasks.    Time 6    Period Weeks    Status On-going      OT LONG TERM GOAL #2  Title Pt will increase her LUE strength to 4-/5 in order to complete basic meal prep and lifting of household items of moderate weight with less difficulty.    Time 6    Period Weeks    Status On-going      OT LONG TERM GOAL #3   Title Pt will increase her LUE A/ROM to [facility] in order to complete shoulder level reaching with less difficulty.    Time 6    Period  Weeks    Status On-going      OT LONG TERM GOAL #4   Title Patient will report a decrease in LUE pain of approximately 4/10 or less when completing bathing and dressing tasks.    Time 6    Period Weeks    Status On-going                   Plan - 05/03/21 1133     Clinical Impression Statement A: Inititated myofascial release to address max fascial restrictions in the left upper arm and upper trapezius region. Passive ROM was completed with VC to decrease muscle guarding. Able to complete supine AA/ROM protraction and IR/er adducted with continued verbal and physical cues for form and technique. Frequently requires verbal cues to count and/or attend to exercise.    Body Structure / Function / Physical Skills ADL;UE functional use;Fascial restriction;Pain;ROM;Strength    Plan P: Continue with supine protraction and IR/er AA/ROM. Provide to HEP if appropriate. Complete therapy ball abduction stretch.    Consulted and Agree with Plan of Care Patient             Patient will benefit from skilled therapeutic intervention in order to improve the following deficits and impairments:   Body Structure / Function / Physical Skills: ADL, UE functional use, Fascial restriction, Pain, ROM, Strength       Visit Diagnosis: Other symptoms and signs involving the musculoskeletal system  Stiffness of left shoulder, not elsewhere classified  Chronic left shoulder pain    Problem List Patient Active Problem List   Diagnosis Date Noted   Acute cystitis with hematuria 04/16/2021   Pressure injury of right heel, unstageable ([place]) 01/06/2021   Polyneuropathy associated with underlying disease ([place]) 01/06/2021   Chronic kidney disease, stage 4 (severe) ([place]) 01/06/2021   Dementia ([place]) 07/13/2020   Cognitive deficits    Carotid bruit 05/26/2020   Hyperlipidemia associated with type 2 diabetes mellitus ([place]) 12/08/2019   Left knee pain 02/11/2017   Iron deficiency 01/21/2017    Vitamin D deficiency 10/15/2016   Urinary incontinence 10/15/2016   Limited mobility 05/27/2016   Type 2 diabetes mellitus with hyperglycemia ([place]) 02/04/2016   Sleep terror disorder 12/14/2015   Fecal incontinence 12/13/2015   Back pain of lumbar region with sciatica 04/15/2015   Spondylosis, cervical, with myelopathy 04/14/2015   OSA (obstructive sleep apnea) 04/10/2013   Peripheral neuropathy 03/12/2013   Hearing loss 11/06/2012   Seizure-like activity ([place]) 01/25/2012   UNSTEADY GAIT 11/07/2010   Alpha thalassemia ([place]) 01/26/2010   Morbid obesity ([place]) 04/21/2008   Anxiety and depression 04/21/2008   Essential hypertension 04/21/2008   GERD 04/21/2008   Osteoarthritis of left knee 04/21/2008    Ailene Ravel, OTR/L,CBIS  [phone]  05/03/2021, 11:47 AM  [facility] [place], [place], [address] Phone: [phone]   Fax:  [phone]  Name: Laura Atkins MRN: [id number] Date of Birth: 04/14/1941

## 2021-05-09 ENCOUNTER — Encounter (HOSPITAL_COMMUNITY): Payer: Self-pay

## 2021-05-09 ENCOUNTER — Other Ambulatory Visit: Payer: Self-pay

## 2021-05-09 ENCOUNTER — Ambulatory Visit (HOSPITAL_COMMUNITY): Payer: Medicare HMO

## 2021-05-09 DIAGNOSIS — M25512 Pain in left shoulder: Secondary | ICD-10-CM | POA: Diagnosis not present

## 2021-05-09 DIAGNOSIS — G8929 Other chronic pain: Secondary | ICD-10-CM

## 2021-05-09 DIAGNOSIS — M25612 Stiffness of left shoulder, not elsewhere classified: Secondary | ICD-10-CM

## 2021-05-09 DIAGNOSIS — R29898 Other symptoms and signs involving the musculoskeletal system: Secondary | ICD-10-CM

## 2021-05-09 NOTE — Patient Instructions (Signed)
Perform each exercise ____10-15____ reps. 1-2x days. Lay down to complete all the following except the last exercise,  1) Protraction   Start by holding a wand or cane at chest height.  Next, slowly push the wand outwards in front of your body so that your elbows become fully straightened. Then, return to the original position.     2) Shoulder FLEXION   In the standing position, hold a wand/cane with both arms, palms down on both sides. Raise up the wand/cane allowing your unaffected arm to perform most of the effort. Your affected arm should be partially relaxed.      3) Internal/External ROTATION   In the standing position, hold a wand/cane with both hands keeping your elbows bent. Move your arms and wand/cane to one side.  Your affected arm should be partially relaxed while your unaffected arm performs most of the effort.             5) Horizontal Abduction/Adduction      Straight arms holding cane at shoulder height, bring cane to right, center, left. Repeat starting to left.   Copyright  VHI. All rights reserved.    4) Shoulder ABDUCTION (SIT)  While holding a wand/cane palm face up on the injured side and palm face down on the uninjured side, slowly raise up your injured arm to the side.

## 2021-05-10 NOTE — Therapy (Signed)
[facility] [address] [place], [place], [address] Phone: [phone]   Fax:  [phone]  Occupational Therapy Treatment  Patient Details  Name: Laura Atkins MRN: [id number] Date of Birth: 02-Dec-1940 Referring Provider (OT): Tula Nakayama, MD   Encounter Date: 05/09/2021   OT End of Session - 05/09/21 1010     Visit Number 3    Number of Visits 12    Date for OT Re-Evaluation 06/12/21    Authorization Type [employer]    Authorization Time Period 12 visits approved (05/01/21-06/12/21)    Authorization - Visit Number 3    Authorization - Number of Visits 12    Progress Note Due on Visit 10    OT Start Time 0945    OT Stop Time 1023    OT Time Calculation (min) 38 min    Activity Tolerance Patient tolerated treatment well    Behavior During Therapy [facility] for tasks assessed/performed             Past Medical History:  Diagnosis Date   Alpha thalassemia trait 01/26/2010   02/2012: Nl CBC ex H&H-10.7/34.8, MCV-69    Anemia    Anxiety    Anxiety and depression    Cellulitis 05/09/2017   Depression    Diabetes mellitus    Foot pain, right 04/30/2013   GERD (gastroesophageal reflux disease)    Headache(784.0)    Hyperlipidemia    Hypertension    Iron deficiency 01/21/2017   Microcytic anemia 01/26/2010   02/2012: Nl CBC ex H&H-10.7/34.8, MCV-69    NECK PAIN, CHRONIC 10/21/2008   +chronic back pain     Obesity    Obstructive sleep apnea    Osteoarthritis    Left knee; right shoulder; chronic neck and back pain   Pruritus    PVD (peripheral vascular disease) ([place]) 01/28/2014   Seizures (HCC)    Shoulder pain, right 04/14/2015   Tremor    This started months ago after her seizure progressing to very poor hand writing   Urinary incontinence    UTI (urinary tract infection) 01/18/2013    Past Surgical History:  Procedure Laterality Date   ABDOMINAL HYSTERECTOMY     BREAST EXCISIONAL BIOPSY     Left; cyst   CATARACT  EXTRACTION Right    12/2017   CATARACT EXTRACTION W/ INTRAOCULAR LENS IMPLANT Left 09/07/2013   CHOLECYSTECTOMY     COLONOSCOPY     COLONOSCOPY N/A 07/20/2015   Procedure: COLONOSCOPY;  Surgeon: Rogene Houston, MD;  [facility];  Service: Endoscopy;  Laterality: N/A;  930   EYE SURGERY Left 09/07/2013   cataract    There were no vitals filed for this visit.   Subjective Assessment - 05/09/21 0953     Subjective  S: Costella Hatcher it was really hurting me.    Patient is accompanied by: Family member   Husband   Currently in Pain? Yes    Pain Score 4     Pain Location Shoulder    Pain Orientation Left    Pain Descriptors / Indicators Sore    Pain Type Chronic pain    Pain Onset More than a month ago    Pain Frequency Constant    Aggravating Factors  trying to move and use it.    Pain Relieving Factors pain cream, massage, heat    Effect of Pain on Daily Activities max effect    Multiple Pain Sites No  [facility] OT Assessment - 05/09/21 1009       Assessment   Medical Diagnosis OA of left shoulder      Precautions   Precautions Fall    Precaution Comments Uses RW for mobility.                      OT Treatments/Exercises (OP) - 05/09/21 1009       Exercises   Exercises Shoulder      Shoulder Exercises: Supine   Protraction PROM;5 reps;AAROM;12 reps    Horizontal ABduction PROM;AAROM;10 reps    External Rotation PROM;AAROM;10 reps    Internal Rotation PROM;AAROM;10 reps    Flexion PROM;AAROM;10 reps    ABduction PROM;10 reps      Manual Therapy   Manual Therapy Myofascial release    Manual therapy comments Manual therapy completed separate from exercises.    Myofascial Release Myofascial release and manual stretching completed to left upper arm and upper trapezius region to decrease fascial restrictions and increase joint mobility in a pain free zone.                    OT Education - 05/09/21 1643      Education Details Supine AA/ROM shoulder exercises (except abduction which will be seated)    Person(s) Educated Patient;Spouse    Methods Explanation;Demonstration;Verbal cues;Tactile cues;Handout    Comprehension Returned demonstration;Verbalized understanding              OT Short Term Goals - 05/03/21 1111       OT SHORT TERM GOAL #1   Title Pt will be educated and verbalize independence with HEP in order to faciliate progress in therapy and increase her ability to use her LUE for 50% of self care tasks.    Time 3    Period Weeks    Status On-going    Target Date 05/22/21      OT SHORT TERM GOAL #2   Title Patient will increase her LUE P/ROM to [facility] in order to increase her ability to complete upper body dressing and bathing tasks.    Time 3    Period Weeks    Status On-going      OT SHORT TERM GOAL #3   Title Patient will increase her LUE strength to 3/10 or better in order to pick up lightweight items and stabilize in both hands without difficulty.    Time 3    Period Weeks    Status On-going      OT SHORT TERM GOAL #4   Title Pt will report a pain level of 6/10 or less in her LUE while completing bathing and dressing tasks.    Time 3    Period Weeks    Status On-going               OT Long Term Goals - 05/03/21 1111       OT LONG TERM GOAL #1   Title Patient will utilize her LUE as her non dominant extremity for 75% or more of daily tasks.    Time 6    Period Weeks    Status On-going      OT LONG TERM GOAL #2   Title Pt will increase her LUE strength to 4-/5 in order to complete basic meal prep and lifting of household items of moderate weight with less difficulty.    Time 6    Period Weeks    Status On-going  OT LONG TERM GOAL #3   Title Pt will increase her LUE A/ROM to [facility] in order to complete shoulder level reaching with less difficulty.    Time 6    Period Weeks    Status On-going      OT LONG TERM GOAL #4   Title Patient will report  a decrease in LUE pain of approximately 4/10 or less when completing bathing and dressing tasks.    Time 6    Period Weeks    Status On-going                   Plan - 05/09/21 1644     Clinical Impression Statement A: Myofascial release completed to address min fascial restriction in the left upper arm and upper trapezius region. Muscle guarding continues to be present during P/ROM although less than previous session. Completed supine AA/ROM except abduction which was completed seated for proper form and technique. VC were provided for form and technique during session. HEP was updated.    Body Structure / Function / Physical Skills ADL;UE functional use;Fascial restriction;Pain;ROM;Strength    Plan P: Follow up on HEP. Complete therapy ball abduction stretch. Seated PVC pipe slide.    OT Home Exercise Plan eval: table slides 8/16: AA/ROM shoulder exercises.    Consulted and Agree with Plan of Care Patient             Patient will benefit from skilled therapeutic intervention in order to improve the following deficits and impairments:   Body Structure / Function / Physical Skills: ADL, UE functional use, Fascial restriction, Pain, ROM, Strength       Visit Diagnosis: Other symptoms and signs involving the musculoskeletal system  Stiffness of left shoulder, not elsewhere classified  Chronic left shoulder pain    Problem List Patient Active Problem List   Diagnosis Date Noted   Acute cystitis with hematuria 04/16/2021   Pressure injury of right heel, unstageable ([place]) 01/06/2021   Polyneuropathy associated with underlying disease ([place]) 01/06/2021   Chronic kidney disease, stage 4 (severe) ([place]) 01/06/2021   Dementia ([place]) 07/13/2020   Cognitive deficits    Carotid bruit 05/26/2020   Hyperlipidemia associated with type 2 diabetes mellitus ([place]) 12/08/2019   Left knee pain 02/11/2017   Iron deficiency 01/21/2017   Vitamin D deficiency 10/15/2016   Urinary  incontinence 10/15/2016   Limited mobility 05/27/2016   Type 2 diabetes mellitus with hyperglycemia ([place]) 02/04/2016   Sleep terror disorder 12/14/2015   Fecal incontinence 12/13/2015   Back pain of lumbar region with sciatica 04/15/2015   Spondylosis, cervical, with myelopathy 04/14/2015   OSA (obstructive sleep apnea) 04/10/2013   Peripheral neuropathy 03/12/2013   Hearing loss 11/06/2012   Seizure-like activity ([place]) 01/25/2012   UNSTEADY GAIT 11/07/2010   Alpha thalassemia ([place]) 01/26/2010   Morbid obesity ([place]) 04/21/2008   Anxiety and depression 04/21/2008   Essential hypertension 04/21/2008   GERD 04/21/2008   Osteoarthritis of left knee 04/21/2008   Ailene Ravel, OTR/L,CBIS  [phone]  05/10/2021, 10:22 AM  [facility], [place], [address] Phone: [phone]   Fax:  [phone]  Name: Laura Atkins MRN: [id number] Date of Birth: [birth year]

## 2021-05-11 ENCOUNTER — Other Ambulatory Visit: Payer: Self-pay | Admitting: Nephrology

## 2021-05-11 ENCOUNTER — Ambulatory Visit (HOSPITAL_COMMUNITY): Payer: Medicare HMO

## 2021-05-11 ENCOUNTER — Other Ambulatory Visit: Payer: Self-pay

## 2021-05-11 ENCOUNTER — Other Ambulatory Visit (HOSPITAL_COMMUNITY): Payer: Self-pay | Admitting: Nephrology

## 2021-05-11 ENCOUNTER — Encounter (HOSPITAL_COMMUNITY): Payer: Self-pay

## 2021-05-11 DIAGNOSIS — M25512 Pain in left shoulder: Secondary | ICD-10-CM | POA: Diagnosis not present

## 2021-05-11 DIAGNOSIS — D560 Alpha thalassemia: Secondary | ICD-10-CM

## 2021-05-11 DIAGNOSIS — E559 Vitamin D deficiency, unspecified: Secondary | ICD-10-CM | POA: Diagnosis not present

## 2021-05-11 DIAGNOSIS — E1122 Type 2 diabetes mellitus with diabetic chronic kidney disease: Secondary | ICD-10-CM

## 2021-05-11 DIAGNOSIS — G8929 Other chronic pain: Secondary | ICD-10-CM | POA: Diagnosis not present

## 2021-05-11 DIAGNOSIS — D638 Anemia in other chronic diseases classified elsewhere: Secondary | ICD-10-CM | POA: Diagnosis not present

## 2021-05-11 DIAGNOSIS — N393 Stress incontinence (female) (male): Secondary | ICD-10-CM | POA: Diagnosis not present

## 2021-05-11 DIAGNOSIS — R29898 Other symptoms and signs involving the musculoskeletal system: Secondary | ICD-10-CM

## 2021-05-11 DIAGNOSIS — E6609 Other obesity due to excess calories: Secondary | ICD-10-CM

## 2021-05-11 DIAGNOSIS — N189 Chronic kidney disease, unspecified: Secondary | ICD-10-CM | POA: Diagnosis not present

## 2021-05-11 DIAGNOSIS — Z79899 Other long term (current) drug therapy: Secondary | ICD-10-CM

## 2021-05-11 DIAGNOSIS — R319 Hematuria, unspecified: Secondary | ICD-10-CM | POA: Diagnosis not present

## 2021-05-11 DIAGNOSIS — M25612 Stiffness of left shoulder, not elsewhere classified: Secondary | ICD-10-CM | POA: Diagnosis not present

## 2021-05-15 ENCOUNTER — Other Ambulatory Visit: Payer: Self-pay

## 2021-05-15 ENCOUNTER — Ambulatory Visit (HOSPITAL_COMMUNITY): Payer: Medicare HMO

## 2021-05-15 ENCOUNTER — Encounter (HOSPITAL_COMMUNITY): Payer: Self-pay

## 2021-05-15 DIAGNOSIS — M25512 Pain in left shoulder: Secondary | ICD-10-CM | POA: Diagnosis not present

## 2021-05-15 DIAGNOSIS — M25612 Stiffness of left shoulder, not elsewhere classified: Secondary | ICD-10-CM

## 2021-05-15 DIAGNOSIS — G8929 Other chronic pain: Secondary | ICD-10-CM | POA: Diagnosis not present

## 2021-05-15 DIAGNOSIS — R29898 Other symptoms and signs involving the musculoskeletal system: Secondary | ICD-10-CM

## 2021-05-15 NOTE — Therapy (Signed)
[facility] [address] [place], [place], [address] Phone: [phone]   Fax:  [phone]  Occupational Therapy Treatment  Patient Details  Name: Laura Atkins MRN: [id number] Date of Birth: 08/02/1941 Referring Provider (OT): Tula Nakayama, MD   Encounter Date: 05/15/2021   OT End of Session - 05/15/21 1512     Visit Number 5    Number of Visits 12    Date for OT Re-Evaluation 06/12/21    Authorization Type [employer]    Authorization Time Period 12 visits approved (05/01/21-06/12/21)    Authorization - Visit Number 5    Authorization - Number of Visits 12    Progress Note Due on Visit 10    OT Start Time 1030    OT Stop Time 1108    OT Time Calculation (min) 38 min    Activity Tolerance Patient tolerated treatment well    Behavior During Therapy [facility] for tasks assessed/performed             Past Medical History:  Diagnosis Date   Alpha thalassemia trait 01/26/2010   02/2012: Nl CBC ex H&H-10.7/34.8, MCV-69    Anemia    Anxiety    Anxiety and depression    Cellulitis 05/09/2017   Depression    Diabetes mellitus    Foot pain, right 04/30/2013   GERD (gastroesophageal reflux disease)    Headache(784.0)    Hyperlipidemia    Hypertension    Iron deficiency 01/21/2017   Microcytic anemia 01/26/2010   02/2012: Nl CBC ex H&H-10.7/34.8, MCV-69    NECK PAIN, CHRONIC 10/21/2008   +chronic back pain     Obesity    Obstructive sleep apnea    Osteoarthritis    Left knee; right shoulder; chronic neck and back pain   Pruritus    PVD (peripheral vascular disease) ([place]) 01/28/2014   Seizures (HCC)    Shoulder pain, right 04/14/2015   Tremor    This started months ago after her seizure progressing to very poor hand writing   Urinary incontinence    UTI (urinary tract infection) 01/18/2013    Past Surgical History:  Procedure Laterality Date   ABDOMINAL HYSTERECTOMY     BREAST EXCISIONAL BIOPSY     Left; cyst   CATARACT  EXTRACTION Right    12/2017   CATARACT EXTRACTION W/ INTRAOCULAR LENS IMPLANT Left 09/07/2013   CHOLECYSTECTOMY     COLONOSCOPY     COLONOSCOPY N/A 07/20/2015   Procedure: COLONOSCOPY;  Surgeon: Rogene Houston, MD;  [facility];  Service: Endoscopy;  Laterality: N/A;  930   EYE SURGERY Left 09/07/2013   cataract    There were no vitals filed for this visit.   Subjective Assessment - 05/15/21 1055     Subjective  S: I woke up early this morning.    Patient is accompanied by: Family member   Husband   Currently in Pain? Yes    Pain Score 5     Pain Location Shoulder    Pain Orientation Left    Pain Descriptors / Indicators Sore    Pain Type Chronic pain    Pain Onset More than a month ago    Pain Frequency Constant    Aggravating Factors  trying to use it.    Pain Relieving Factors pain cream, massage, heat    Effect of Pain on Daily Activities mod effect    Multiple Pain Sites No  [facility] OT Assessment - 05/15/21 1511       Assessment   Medical Diagnosis OA of left shoulder      Precautions   Precautions Fall    Precaution Comments Uses RW for mobility.                      OT Treatments/Exercises (OP) - 05/15/21 1056       Exercises   Exercises Shoulder      Shoulder Exercises: Supine   Protraction PROM;5 reps;AROM;10 reps    Horizontal ABduction PROM;5 reps    External Rotation PROM;5 reps;AROM;10 reps    Internal Rotation PROM;5 reps;AROM;10 reps    Flexion PROM;5 reps;AROM;10 reps    ABduction PROM;5 reps      Shoulder Exercises: Seated   Protraction AROM;10 reps    Horizontal ABduction AAROM;10 reps    External Rotation AROM;10 reps    Internal Rotation AROM;10 reps    Flexion AROM;10 reps;Limitations    Flexion Limitations to shoulder level      Shoulder Exercises: Pulleys   Flexion 1 minute   standing     Shoulder Exercises: ROM/Strengthening   Wall Wash 1'      Manual Therapy   Manual Therapy  Myofascial release    Manual therapy comments Manual therapy completed separate from exercises.    Myofascial Release Myofascial release and manual stretching completed to left upper arm and upper trapezius region to decrease fascial restrictions and increase joint mobility in a pain free zone.                    OT Education - 05/15/21 1512     Education Details updated HEP. Seated A/ROM shoulder exercises for all except abduction and horizontal abduction; complete as AA/ROM.    Person(s) Educated Patient;Spouse    Methods Explanation;Demonstration;Verbal cues;Tactile cues;Handout    Comprehension Returned demonstration;Verbalized understanding              OT Short Term Goals - 05/03/21 1111       OT SHORT TERM GOAL #1   Title Pt will be educated and verbalize independence with HEP in order to faciliate progress in therapy and increase her ability to use her LUE for 50% of self care tasks.    Time 3    Period Weeks    Status On-going    Target Date 05/22/21      OT SHORT TERM GOAL #2   Title Patient will increase her LUE P/ROM to [facility] in order to increase her ability to complete upper body dressing and bathing tasks.    Time 3    Period Weeks    Status On-going      OT SHORT TERM GOAL #3   Title Patient will increase her LUE strength to 3/10 or better in order to pick up lightweight items and stabilize in both hands without difficulty.    Time 3    Period Weeks    Status On-going      OT SHORT TERM GOAL #4   Title Pt will report a pain level of 6/10 or less in her LUE while completing bathing and dressing tasks.    Time 3    Period Weeks    Status On-going               OT Long Term Goals - 05/03/21 1111       OT LONG TERM GOAL #1   Title Patient will utilize  her LUE as her non dominant extremity for 75% or more of daily tasks.    Time 6    Period Weeks    Status On-going      OT LONG TERM GOAL #2   Title Pt will increase her LUE strength to  4-/5 in order to complete basic meal prep and lifting of household items of moderate weight with less difficulty.    Time 6    Period Weeks    Status On-going      OT LONG TERM GOAL #3   Title Pt will increase her LUE A/ROM to [facility] in order to complete shoulder level reaching with less difficulty.    Time 6    Period Weeks    Status On-going      OT LONG TERM GOAL #4   Title Patient will report a decrease in LUE pain of approximately 4/10 or less when completing bathing and dressing tasks.    Time 6    Period Weeks    Status On-going                   Plan - 05/15/21 1513     Clinical Impression Statement A: Manual techniques completed to address min fascial restrictions located at upper arm and upper trapezius region. Patient was able to tolerate passive abduction to almost full range supine this date. VC to decrease muscle guarding during stretching. Supine exercises were minimal this date as patient was falling asleep. Able to complete all seated exercises at A/ROM exept horizontal abduction and abduction. HEP was updated and reviewed.    Body Structure / Function / Physical Skills ADL;UE functional use;Fascial restriction;Pain;ROM;Strength    Plan P: Follow up on HEP. Continue with seated A/ROM while progressing towards completing all shoulder exercises actively.    OT Home Exercise Plan eval: table slides 8/16: AA/ROM shoulder exercises. 8/22: A/ROM seated shoulder exercises. AA/ROM abduction and horizontal abduction    Consulted and Agree with Plan of Care Patient             Patient will benefit from skilled therapeutic intervention in order to improve the following deficits and impairments:   Body Structure / Function / Physical Skills: ADL, UE functional use, Fascial restriction, Pain, ROM, Strength       Visit Diagnosis: Other symptoms and signs involving the musculoskeletal system  Chronic left shoulder pain  Stiffness of left shoulder, not elsewhere  classified    Problem List Patient Active Problem List   Diagnosis Date Noted   Acute cystitis with hematuria 04/16/2021   Pressure injury of right heel, unstageable ([place]) 01/06/2021   Polyneuropathy associated with underlying disease ([place]) 01/06/2021   Chronic kidney disease, stage 4 (severe) ([place]) 01/06/2021   Dementia ([place]) 07/13/2020   Cognitive deficits    Carotid bruit 05/26/2020   Hyperlipidemia associated with type 2 diabetes mellitus ([place]) 12/08/2019   Left knee pain 02/11/2017   Iron deficiency 01/21/2017   Vitamin D deficiency 10/15/2016   Urinary incontinence 10/15/2016   Limited mobility 05/27/2016   Type 2 diabetes mellitus with hyperglycemia ([place]) 02/04/2016   Sleep terror disorder 12/14/2015   Fecal incontinence 12/13/2015   Back pain of lumbar region with sciatica 04/15/2015   Spondylosis, cervical, with myelopathy 04/14/2015   OSA (obstructive sleep apnea) 04/10/2013   Peripheral neuropathy 03/12/2013   Hearing loss 11/06/2012   Seizure-like activity ([place]) 01/25/2012   UNSTEADY GAIT 11/07/2010   Alpha thalassemia ([place]) 01/26/2010   Morbid obesity ([place]) 04/21/2008  Anxiety and depression 04/21/2008   Essential hypertension 04/21/2008   GERD 04/21/2008   Osteoarthritis of left knee 04/21/2008    Ailene Ravel, OTR/L,CBIS  [phone]  05/15/2021, 3:16 PM  [facility] [address] [place], [place], [address] Phone: [phone]   Fax:  [phone]  Name: Laura Atkins MRN: [id number] Date of Birth: 05-13-1941

## 2021-05-15 NOTE — Patient Instructions (Signed)
Repeat all exercises 10-15 times, 1-2 times per day.  1) Shoulder Protraction    Begin with elbows by your side, slowly "punch" straight out in front of you.      2) Shoulder Flexion Sitting:         Begin with arms at your side with thumbs pointed up, slowly raise both arms up and forward towards overhead.      4) Internal & External Rotation   Sitting:     Sit with elbows at the side and elbows bent 90 degrees. Move your forearms away from your body, then bring back inward toward the body.      4) Shoulder ABDUCTION   While holding a wand/cane palm face up on the injured side and palm face down on the uninjured side, slowly raise up your injured arm to the side.        5) Horizontal Abduction/Adduction      Straight arms holding cane at shoulder height, bring cane to right, center, left. Repeat starting to left.

## 2021-05-15 NOTE — Therapy (Signed)
[facility] [address] [place], [place], [address] Phone: [phone]   Fax:  [phone]  Occupational Therapy Treatment  Patient Details  Name: Laura Atkins MRN: [id number] Date of Birth: August 25, 1941 Referring Provider (OT): Tula Nakayama, MD   Encounter Date: 05/11/2021   OT End of Session - 05/15/21 0858     Visit Number 4    Number of Visits 12    Date for OT Re-Evaluation 06/12/21    Authorization Type [employer]    Authorization Time Period 12 visits approved (05/01/21-06/12/21)    Authorization - Visit Number 4    Authorization - Number of Visits 12    Progress Note Due on Visit 10    OT Start Time 1645    OT Stop Time 1730    OT Time Calculation (min) 45 min    Activity Tolerance Patient tolerated treatment well    Behavior During Therapy [facility] for tasks assessed/performed             Past Medical History:  Diagnosis Date   Alpha thalassemia trait 01/26/2010   02/2012: Nl CBC ex H&H-10.7/34.8, MCV-69    Anemia    Anxiety    Anxiety and depression    Cellulitis 05/09/2017   Depression    Diabetes mellitus    Foot pain, right 04/30/2013   GERD (gastroesophageal reflux disease)    Headache(784.0)    Hyperlipidemia    Hypertension    Iron deficiency 01/21/2017   Microcytic anemia 01/26/2010   02/2012: Nl CBC ex H&H-10.7/34.8, MCV-69    NECK PAIN, CHRONIC 10/21/2008   +chronic back pain     Obesity    Obstructive sleep apnea    Osteoarthritis    Left knee; right shoulder; chronic neck and back pain   Pruritus    PVD (peripheral vascular disease) ([place]) 01/28/2014   Seizures (HCC)    Shoulder pain, right 04/14/2015   Tremor    This started months ago after her seizure progressing to very poor hand writing   Urinary incontinence    UTI (urinary tract infection) 01/18/2013    Past Surgical History:  Procedure Laterality Date   ABDOMINAL HYSTERECTOMY     BREAST EXCISIONAL BIOPSY     Left; cyst   CATARACT  EXTRACTION Right    12/2017   CATARACT EXTRACTION W/ INTRAOCULAR LENS IMPLANT Left 09/07/2013   CHOLECYSTECTOMY     COLONOSCOPY     COLONOSCOPY N/A 07/20/2015   Procedure: COLONOSCOPY;  Surgeon: Rogene Houston, MD;  [facility];  Service: Endoscopy;  Laterality: N/A;  930   EYE SURGERY Left 09/07/2013   cataract    There were no vitals filed for this visit.      05/11/21 1708  Assessment  Medical Diagnosis OA of left shoulder  Precautions  Precautions Fall  Precaution Comments Uses RW for mobility.     05/11/21 1708  Exercises  Exercises Shoulder  Shoulder Exercises: Supine  Protraction PROM;5 reps;AROM;10 reps  Horizontal ABduction PROM;5 reps;AROM;10 reps  External Rotation PROM;5 reps;AROM;10 reps  Internal Rotation PROM;5 reps;AROM;10 reps  Flexion PROM;5 reps;AROM;10 reps  ABduction PROM;10 reps  Shoulder Exercises: Seated  Protraction AAROM;10 reps  Flexion AAROM;10 reps  Abduction AAROM;10 reps  Shoulder Exercises: Pulleys  Flexion 1 minute (standing)  Manual Therapy  Manual Therapy Myofascial release  Manual therapy comments Manual therapy completed separate from exercises.  Myofascial Release Myofascial release and manual stretching completed to left upper arm and upper  trapezius region to decrease fascial restrictions and increase joint mobility in a pain free zone.                         OT Short Term Goals - 05/03/21 1111       OT SHORT TERM GOAL #1   Title Pt will be educated and verbalize independence with HEP in order to faciliate progress in therapy and increase her ability to use her LUE for 50% of self care tasks.    Time 3    Period Weeks    Status On-going    Target Date 05/22/21      OT SHORT TERM GOAL #2   Title Patient will increase her LUE P/ROM to [facility] in order to increase her ability to complete upper body dressing and bathing tasks.    Time 3    Period Weeks    Status On-going      OT SHORT TERM  GOAL #3   Title Patient will increase her LUE strength to 3/10 or better in order to pick up lightweight items and stabilize in both hands without difficulty.    Time 3    Period Weeks    Status On-going      OT SHORT TERM GOAL #4   Title Pt will report a pain level of 6/10 or less in her LUE while completing bathing and dressing tasks.    Time 3    Period Weeks    Status On-going               OT Long Term Goals - 05/03/21 1111       OT LONG TERM GOAL #1   Title Patient will utilize her LUE as her non dominant extremity for 75% or more of daily tasks.    Time 6    Period Weeks    Status On-going      OT LONG TERM GOAL #2   Title Pt will increase her LUE strength to 4-/5 in order to complete basic meal prep and lifting of household items of moderate weight with less difficulty.    Time 6    Period Weeks    Status On-going      OT LONG TERM GOAL #3   Title Pt will increase her LUE A/ROM to [facility] in order to complete shoulder level reaching with less difficulty.    Time 6    Period Weeks    Status On-going      OT LONG TERM GOAL #4   Title Patient will report a decrease in LUE pain of approximately 4/10 or less when completing bathing and dressing tasks.    Time 6    Period Weeks    Status On-going                   Plan - 05/15/21 ID:[id number]     Clinical Impression Statement A: Manual techniques  completed to address fascial restrictions located in the left upper arm. Min fascial restrictions palpated this date. Patient was able to complete supine A/ROM shoulder exercises this date which is a great improvement from previous session. Added standing pulleys as well with VC for form and technique provided throughout session.    Body Structure / Function / Physical Skills ADL;UE functional use;Fascial restriction;Pain;ROM;Strength    Plan P: Update HEP with supine A/ROM and seated AA/ROM.    Consulted and Agree with Plan of Care Patient  Patient  will benefit from skilled therapeutic intervention in order to improve the following deficits and impairments:   Body Structure / Function / Physical Skills: ADL, UE functional use, Fascial restriction, Pain, ROM, Strength       Visit Diagnosis: Chronic left shoulder pain  Other symptoms and signs involving the musculoskeletal system  Stiffness of left shoulder, not elsewhere classified    Problem List Patient Active Problem List   Diagnosis Date Noted   Acute cystitis with hematuria 04/16/2021   Pressure injury of right heel, unstageable ([place]) 01/06/2021   Polyneuropathy associated with underlying disease ([place]) 01/06/2021   Chronic kidney disease, stage 4 (severe) ([place]) 01/06/2021   Dementia ([place]) 07/13/2020   Cognitive deficits    Carotid bruit 05/26/2020   Hyperlipidemia associated with type 2 diabetes mellitus ([place]) 12/08/2019   Left knee pain 02/11/2017   Iron deficiency 01/21/2017   Vitamin D deficiency 10/15/2016   Urinary incontinence 10/15/2016   Limited mobility 05/27/2016   Type 2 diabetes mellitus with hyperglycemia ([place]) 02/04/2016   Sleep terror disorder 12/14/2015   Fecal incontinence 12/13/2015   Back pain of lumbar region with sciatica 04/15/2015   Spondylosis, cervical, with myelopathy 04/14/2015   OSA (obstructive sleep apnea) 04/10/2013   Peripheral neuropathy 03/12/2013   Hearing loss 11/06/2012   Seizure-like activity ([place]) 01/25/2012   UNSTEADY GAIT 11/07/2010   Alpha thalassemia ([place]) 01/26/2010   Morbid obesity ([place]) 04/21/2008   Anxiety and depression 04/21/2008   Essential hypertension 04/21/2008   GERD 04/21/2008   Osteoarthritis of left knee 04/21/2008    Ailene Ravel, OTR/L,CBIS  [phone]  05/15/2021, 9:02 AM  [facility] [address] [place], [place], [address] Phone: [phone]   Fax:  [phone]  Name: Laura Atkins MRN: [id number] Date of Birth: [birth year]

## 2021-05-16 ENCOUNTER — Other Ambulatory Visit: Payer: Self-pay | Admitting: Family Medicine

## 2021-05-16 ENCOUNTER — Other Ambulatory Visit (HOSPITAL_COMMUNITY): Payer: Self-pay | Admitting: Psychiatry

## 2021-05-18 ENCOUNTER — Telehealth (HOSPITAL_COMMUNITY): Payer: Self-pay | Admitting: Physical Therapy

## 2021-05-18 ENCOUNTER — Encounter: Payer: Self-pay | Admitting: Family Medicine

## 2021-05-18 ENCOUNTER — Encounter (HOSPITAL_COMMUNITY): Payer: Medicare HMO

## 2021-05-18 ENCOUNTER — Ambulatory Visit (INDEPENDENT_AMBULATORY_CARE_PROVIDER_SITE_OTHER): Payer: Medicare HMO | Admitting: Family Medicine

## 2021-05-18 ENCOUNTER — Ambulatory Visit (HOSPITAL_COMMUNITY): Payer: Medicare HMO | Admitting: Physical Therapy

## 2021-05-18 ENCOUNTER — Other Ambulatory Visit: Payer: Self-pay

## 2021-05-18 VITALS — BP 136/63 | HR 80 | Resp 16 | Ht 68.0 in | Wt 260.0 lb

## 2021-05-18 DIAGNOSIS — E1165 Type 2 diabetes mellitus with hyperglycemia: Secondary | ICD-10-CM

## 2021-05-18 DIAGNOSIS — D638 Anemia in other chronic diseases classified elsewhere: Secondary | ICD-10-CM | POA: Diagnosis not present

## 2021-05-18 DIAGNOSIS — Z79899 Other long term (current) drug therapy: Secondary | ICD-10-CM | POA: Diagnosis not present

## 2021-05-18 DIAGNOSIS — Z794 Long term (current) use of insulin: Secondary | ICD-10-CM

## 2021-05-18 DIAGNOSIS — N3001 Acute cystitis with hematuria: Secondary | ICD-10-CM | POA: Diagnosis not present

## 2021-05-18 DIAGNOSIS — E6609 Other obesity due to excess calories: Secondary | ICD-10-CM | POA: Diagnosis not present

## 2021-05-18 DIAGNOSIS — N390 Urinary tract infection, site not specified: Secondary | ICD-10-CM | POA: Diagnosis not present

## 2021-05-18 DIAGNOSIS — I10 Essential (primary) hypertension: Secondary | ICD-10-CM

## 2021-05-18 DIAGNOSIS — R319 Hematuria, unspecified: Secondary | ICD-10-CM | POA: Diagnosis not present

## 2021-05-18 DIAGNOSIS — N189 Chronic kidney disease, unspecified: Secondary | ICD-10-CM | POA: Diagnosis not present

## 2021-05-18 DIAGNOSIS — E1122 Type 2 diabetes mellitus with diabetic chronic kidney disease: Secondary | ICD-10-CM | POA: Diagnosis not present

## 2021-05-18 DIAGNOSIS — E559 Vitamin D deficiency, unspecified: Secondary | ICD-10-CM | POA: Diagnosis not present

## 2021-05-18 DIAGNOSIS — D560 Alpha thalassemia: Secondary | ICD-10-CM | POA: Diagnosis not present

## 2021-05-18 LAB — POCT URINALYSIS DIP (CLINITEK)
Bilirubin, UA: NEGATIVE
Glucose, UA: NEGATIVE mg/dL
Ketones, POC UA: NEGATIVE mg/dL
Nitrite, UA: NEGATIVE
POC PROTEIN,UA: NEGATIVE
Spec Grav, UA: 1.015 (ref 1.010–1.025)
Urobilinogen, UA: 0.2 E.U./dL
pH, UA: 5.5 (ref 5.0–8.0)

## 2021-05-18 LAB — GLUCOSE, POCT (MANUAL RESULT ENTRY): POC Glucose: 228 mg/dl — AB (ref 70–99)

## 2021-05-18 MED ORDER — NITROFURANTOIN MONOHYD MACRO 100 MG PO CAPS: 100.0000 mg | ORAL_CAPSULE | Freq: Two times a day (BID) | ORAL | 0 refills | Status: DC

## 2021-05-18 NOTE — Patient Instructions (Addendum)
Annual exam in October as before, call if you need me sooner  You are treated for presumed UTI, 10 day course of medication is prescribed and you are also referred to Urology due to recurrent bladder infections  Ensure you drink 64 ounces water daily and empty your bladder often  Call and schedule visit for flu vaccine in September please  Thanks for choosing [facility], we consider it a privelige to serve you.

## 2021-05-18 NOTE — Telephone Encounter (Signed)
No Show. Called and patient's family member reported that she had a medical emergency and said they meant to call but forgot. Confirmed cancellation for OT appointment later today.  9:52 AM, 05/18/21 Jerene Pitch, DPT Physical Therapy with [facility]  [phone] office

## 2021-05-18 NOTE — Progress Notes (Signed)
   Laura Atkins     MRN: [id number]      DOB: 11/23/1940   HPI Laura Atkins is here with a 1 week h/o frequency and dysuria, denies any known fever and chills, has chronic back pain , c/o fatigue , ecently treated for confirmed UTI and presents again  ROS Denies recent fever or chills. Denies sinus pressure, nasal congestion, ear pain or sore throat. Denies chest congestion, productive cough or wheezing. Chronic joint pain, swelling and limitation in mobility. . Denies  uncontrolled depression, anxiety or insomnia. Denies skin break down or rash.   PE  BP 136/63   Pulse 80   Resp 16   Ht '5\' 8"'$  (1.727 m)   Wt 260 lb (117.9 kg)   SpO2 95%   BMI 39.53 kg/m   Patient alert and oriented and in no cardiopulmonary distress.Ill appearing  HEENT: No facial asymmetry, EOMI,     Neck supple .  Chest: Clear to auscultation bilaterally.  CVS: S1, S2 no murmurs, no S3.Regular rate.  ABD: Soft no renal angle tenderness Ext: No edema  MS: Decreased  ROM spine, shoulders, hips and knees.  Skin: Intact, no ulcerations or rash noted.  Psych: Good eye contact, normal affect. not anxious or depressed appearing.  CNS: CN 2-12 intact, power,  normal throughout.no focal deficits noted.   Assessment & Plan  Acute cystitis with hematuria Symptomatic with abn UA, treated 1 month ago fro UTI, will need urology eval antibiotoc started and will f/u c/S  Recurrent UTI Needs urology eval  Type 2 diabetes mellitus with hyperglycemia (HCC) C/o fatigue and ill appearing, random blood glucose checked in office  Essential hypertension Controlled, no change in medication

## 2021-05-19 ENCOUNTER — Ambulatory Visit (HOSPITAL_COMMUNITY)
Admission: RE | Admit: 2021-05-19 | Discharge: 2021-05-19 | Disposition: A | Discharge status: Home / Self Care | Payer: Medicare HMO | Source: Ambulatory Visit | Attending: Nephrology | Admitting: Nephrology

## 2021-05-19 ENCOUNTER — Telehealth: Payer: Self-pay | Admitting: *Deleted

## 2021-05-19 DIAGNOSIS — D638 Anemia in other chronic diseases classified elsewhere: Secondary | ICD-10-CM | POA: Insufficient documentation

## 2021-05-19 DIAGNOSIS — R319 Hematuria, unspecified: Secondary | ICD-10-CM | POA: Insufficient documentation

## 2021-05-19 DIAGNOSIS — E6609 Other obesity due to excess calories: Secondary | ICD-10-CM | POA: Insufficient documentation

## 2021-05-19 DIAGNOSIS — D560 Alpha thalassemia: Secondary | ICD-10-CM | POA: Diagnosis not present

## 2021-05-19 DIAGNOSIS — Z79899 Other long term (current) drug therapy: Secondary | ICD-10-CM | POA: Diagnosis not present

## 2021-05-19 DIAGNOSIS — E1122 Type 2 diabetes mellitus with diabetic chronic kidney disease: Secondary | ICD-10-CM | POA: Diagnosis not present

## 2021-05-19 DIAGNOSIS — E559 Vitamin D deficiency, unspecified: Secondary | ICD-10-CM | POA: Insufficient documentation

## 2021-05-19 DIAGNOSIS — N189 Chronic kidney disease, unspecified: Secondary | ICD-10-CM | POA: Diagnosis not present

## 2021-05-19 NOTE — Chronic Care Management (AMB) (Signed)
  Chronic Care Management   Outreach Note  05/19/2021 Name: [name] MRN: [id number] DOB: 1940/11/19  Laura Atkins is a 80 y.o. year old female who is a primary care patient of Moshe Cipro Norwood Levo, MD. I reached out to [name] by phone today in response to a referral sent by Ms. Helene Kelp Fonner's PCP, Dr. Moshe Cipro      An unsuccessful telephone outreach was attempted today. The patient was referred to the case management team for assistance with care management and care coordination.   Follow Up Plan: A HIPAA compliant phone message was left for the patient providing contact information and requesting a return call. The care management team will reach out to the patient again over the next 7 days.  If patient returns call to provider office, please advise to call [facility] at [phone].  [facility]  Direct Dial: [phone]

## 2021-05-21 ENCOUNTER — Encounter: Payer: Self-pay | Admitting: Family Medicine

## 2021-05-21 DIAGNOSIS — N390 Urinary tract infection, site not specified: Secondary | ICD-10-CM | POA: Insufficient documentation

## 2021-05-21 NOTE — Assessment & Plan Note (Signed)
Symptomatic with abn UA, treated 1 month ago fro UTI, will need urology eval antibiotoc started and will f/u c/S

## 2021-05-21 NOTE — Assessment & Plan Note (Signed)
Controlled, no change in medication  

## 2021-05-21 NOTE — Assessment & Plan Note (Signed)
Needs urology eval

## 2021-05-21 NOTE — Assessment & Plan Note (Signed)
C/o fatigue and ill appearing, random blood glucose checked in office

## 2021-05-23 ENCOUNTER — Other Ambulatory Visit: Payer: Self-pay

## 2021-05-23 ENCOUNTER — Ambulatory Visit (HOSPITAL_COMMUNITY): Payer: Medicare HMO | Admitting: Occupational Therapy

## 2021-05-23 ENCOUNTER — Encounter (HOSPITAL_COMMUNITY): Payer: Self-pay | Admitting: Occupational Therapy

## 2021-05-23 ENCOUNTER — Ambulatory Visit: Payer: Medicare HMO | Admitting: Nurse Practitioner

## 2021-05-23 DIAGNOSIS — M25612 Stiffness of left shoulder, not elsewhere classified: Secondary | ICD-10-CM

## 2021-05-23 DIAGNOSIS — R29898 Other symptoms and signs involving the musculoskeletal system: Secondary | ICD-10-CM

## 2021-05-23 DIAGNOSIS — M25512 Pain in left shoulder: Secondary | ICD-10-CM | POA: Diagnosis not present

## 2021-05-23 DIAGNOSIS — G8929 Other chronic pain: Secondary | ICD-10-CM | POA: Diagnosis not present

## 2021-05-23 LAB — URINE CULTURE

## 2021-05-23 NOTE — Therapy (Signed)
[facility] [address] [place], [place], [address] Phone: [phone]   Fax:  [phone]  Occupational Therapy Treatment  Patient Details  Name: Laura Atkins MRN: [id number] Date of Birth: 04-Mar-1941 Referring Provider (OT): Tula Nakayama, MD   Encounter Date: 05/23/2021   OT End of Session - 05/23/21 1159     Visit Number 6    Number of Visits 12    Date for OT Re-Evaluation 06/12/21    Authorization Type [employer]    Authorization Time Period 12 visits approved (05/01/21-06/12/21)    Authorization - Visit Number 6    Authorization - Number of Visits 12    Progress Note Due on Visit 10    OT Start Time 1115    OT Stop Time 1158    OT Time Calculation (min) 43 min    Activity Tolerance Patient tolerated treatment well    Behavior During Therapy [facility] for tasks assessed/performed             Past Medical History:  Diagnosis Date   Alpha thalassemia trait 01/26/2010   02/2012: Nl CBC ex H&H-10.7/34.8, MCV-69    Anemia    Anxiety    Anxiety and depression    Cellulitis 05/09/2017   Depression    Diabetes mellitus    Foot pain, right 04/30/2013   GERD (gastroesophageal reflux disease)    Headache(784.0)    Hyperlipidemia    Hypertension    Iron deficiency 01/21/2017   Microcytic anemia 01/26/2010   02/2012: Nl CBC ex H&H-10.7/34.8, MCV-69    NECK PAIN, CHRONIC 10/21/2008   +chronic back pain     Obesity    Obstructive sleep apnea    Osteoarthritis    Left knee; right shoulder; chronic neck and back pain   Pruritus    PVD (peripheral vascular disease) ([place]) 01/28/2014   Seizures (HCC)    Shoulder pain, right 04/14/2015   Tremor    This started months ago after her seizure progressing to very poor hand writing   Urinary incontinence    UTI (urinary tract infection) 01/18/2013    Past Surgical History:  Procedure Laterality Date   ABDOMINAL HYSTERECTOMY     BREAST EXCISIONAL BIOPSY     Left; cyst   CATARACT  EXTRACTION Right    12/2017   CATARACT EXTRACTION W/ INTRAOCULAR LENS IMPLANT Left 09/07/2013   CHOLECYSTECTOMY     COLONOSCOPY     COLONOSCOPY N/A 07/20/2015   Procedure: COLONOSCOPY;  Surgeon: Rogene Houston, MD;  Location: [facility];  Service: Endoscopy;  Laterality: N/A;  930   EYE SURGERY Left 09/07/2013   cataract    There were no vitals filed for this visit.   Subjective Assessment - 05/23/21 1113     Subjective  S: I've been doing some new things with it.    Currently in Pain? Yes    Pain Score 5     Pain Location Shoulder    Pain Orientation Left    Pain Descriptors / Indicators Sore    Pain Type Chronic pain    Pain Radiating Towards N/A    Pain Onset More than a month ago    Pain Frequency Constant    Aggravating Factors  trying to use it    Pain Relieving Factors pain cream, massage, heat    Effect of Pain on Daily Activities mod effect on ADLs  [facility] OT Assessment - 05/23/21 1112       Assessment   Medical Diagnosis OA of left shoulder      Precautions   Precautions Fall    Precaution Comments Uses RW for mobility.                      OT Treatments/Exercises (OP) - 05/23/21 1120       Exercises   Exercises Shoulder      Shoulder Exercises: Supine   Protraction PROM;5 reps;AROM;10 reps    Horizontal ABduction PROM;5 reps;AROM;10 reps    External Rotation PROM;5 reps;AROM;10 reps    Internal Rotation PROM;5 reps;AROM;10 reps    Flexion PROM;5 reps;AROM;10 reps    ABduction PROM;5 reps      Shoulder Exercises: Seated   Protraction AROM;10 reps    External Rotation AROM;10 reps    Internal Rotation AROM;10 reps    Flexion AROM;10 reps;Limitations    Flexion Limitations to slightly above shoulder height      Shoulder Exercises: ROM/Strengthening   Other ROM/Strengthening Exercises pvc pipe slide, 10X flexion      Manual Therapy   Manual Therapy Myofascial release    Manual therapy comments Manual therapy  completed separate from exercises.    Myofascial Release Myofascial release and manual stretching completed to left upper arm and upper trapezius region to decrease fascial restrictions and increase joint mobility in a pain free zone.                      OT Short Term Goals - 05/03/21 1111       OT SHORT TERM GOAL #1   Title Pt will be educated and verbalize independence with HEP in order to faciliate progress in therapy and increase her ability to use her LUE for 50% of self care tasks.    Time 3    Period Weeks    Status On-going    Target Date 05/22/21      OT SHORT TERM GOAL #2   Title Patient will increase her LUE P/ROM to [facility] in order to increase her ability to complete upper body dressing and bathing tasks.    Time 3    Period Weeks    Status On-going      OT SHORT TERM GOAL #3   Title Patient will increase her LUE strength to 3/10 or better in order to pick up lightweight items and stabilize in both hands without difficulty.    Time 3    Period Weeks    Status On-going      OT SHORT TERM GOAL #4   Title Pt will report a pain level of 6/10 or less in her LUE while completing bathing and dressing tasks.    Time 3    Period Weeks    Status On-going               OT Long Term Goals - 05/03/21 1111       OT LONG TERM GOAL #1   Title Patient will utilize her LUE as her non dominant extremity for 75% or more of daily tasks.    Time 6    Period Weeks    Status On-going      OT LONG TERM GOAL #2   Title Pt will increase her LUE strength to 4-/5 in order to complete basic meal prep and lifting of household items of moderate weight with less difficulty.    Time  6    Period Weeks    Status On-going      OT LONG TERM GOAL #3   Title Pt will increase her LUE A/ROM to [facility] in order to complete shoulder level reaching with less difficulty.    Time 6    Period Weeks    Status On-going      OT LONG TERM GOAL #4   Title Patient will report a decrease in  LUE pain of approximately 4/10 or less when completing bathing and dressing tasks.    Time 6    Period Weeks    Status On-going                   Plan - 05/23/21 1142     Clinical Impression Statement A: Pt reports she has been completing HEP. Continued with myofascial release to address fascial restrictions in upper arm and upper trapezius. Continued with passive stretching, pt able to tolerate [facility]. Continued with A/ROM in supine and sitting, verbal cuing to depress trapezius. In sitting, pt achieving flexion slightly greater than 90 degrees. Added PVC pipe slide. Verbal cuing for form and technique.    Body Structure / Function / Physical Skills ADL;UE functional use;Fascial restriction;Pain;ROM;Strength    Plan P: Continue working towards all A/ROM in sitting    OT Home Exercise Plan eval: table slides 8/16: AA/ROM shoulder exercises. 8/22: A/ROM seated shoulder exercises. AA/ROM abduction and horizontal abduction    Consulted and Agree with Plan of Care Patient             Patient will benefit from skilled therapeutic intervention in order to improve the following deficits and impairments:   Body Structure / Function / Physical Skills: ADL, UE functional use, Fascial restriction, Pain, ROM, Strength       Visit Diagnosis: Other symptoms and signs involving the musculoskeletal system  Chronic left shoulder pain  Stiffness of left shoulder, not elsewhere classified    Problem List Patient Active Problem List   Diagnosis Date Noted   Recurrent UTI 05/21/2021   Acute cystitis with hematuria 04/16/2021   Pressure injury of right heel, unstageable ([place]) 01/06/2021   Polyneuropathy associated with underlying disease ([place]) 01/06/2021   Chronic kidney disease, stage 4 (severe) ([place]) 01/06/2021   Dementia ([place]) 07/13/2020   Cognitive deficits    Carotid bruit 05/26/2020   Hyperlipidemia associated with type 2 diabetes mellitus ([place]) 12/08/2019   Left knee pain  02/11/2017   Iron deficiency 01/21/2017   Vitamin D deficiency 10/15/2016   Urinary incontinence 10/15/2016   Limited mobility 05/27/2016   Type 2 diabetes mellitus with hyperglycemia ([place]) 02/04/2016   Sleep terror disorder 12/14/2015   Fecal incontinence 12/13/2015   Back pain of lumbar region with sciatica 04/15/2015   Spondylosis, cervical, with myelopathy 04/14/2015   OSA (obstructive sleep apnea) 04/10/2013   Peripheral neuropathy 03/12/2013   Hearing loss 11/06/2012   Seizure-like activity ([place]) 01/25/2012   UNSTEADY GAIT 11/07/2010   Alpha thalassemia ([place]) 01/26/2010   Morbid obesity ([place]) 04/21/2008   Anxiety and depression 04/21/2008   Essential hypertension 04/21/2008   GERD 04/21/2008   Osteoarthritis of left knee 04/21/2008    Guadelupe Sabin, OTR/L  [phone] 05/23/2021, 12:00 PM  [facility] [place], [place], [address] Phone: [phone]   Fax:  [phone]  Name: NAJA RUGE MRN: [id number] Date of Birth: [birth year]

## 2021-05-25 ENCOUNTER — Encounter (HOSPITAL_COMMUNITY): Payer: Medicare HMO | Admitting: Occupational Therapy

## 2021-05-25 NOTE — Chronic Care Management (AMB) (Signed)
  Chronic Care Management   Note  05/25/2021 Name: ALVEENA TAIRA MRN: [id number] DOB: January 09, 1941  JILLENE WEHRENBERG is a 80 y.o. year old female who is a primary care patient of Moshe Cipro [place] Levo, MD. I reached out to [name] by phone today in response to a referral sent by Ms. Helene Kelp Mittal's PCP, Dr. Moshe Cipro.      Ms. Shepherd was given information about Chronic Care Management services today including:  CCM service includes personalized support from designated clinical staff supervised by her physician, including individualized plan of care and coordination with other care providers 24/7 contact phone numbers for assistance for urgent and routine care needs. Service will only be billed when office clinical staff spend 20 minutes or more in a month to coordinate care. Only one practitioner may furnish and bill the service in a calendar month. The patient may stop CCM services at any time (effective at the end of the month) by phone call to the office staff. The patient will be responsible for cost sharing (co-pay) of up to 20% of the service fee (after annual deductible is met).  Patient agreed to services and verbal consent obtained.   Follow up plan: Telephone appointment with care management team member scheduled for:06/01/21  [facility]  Direct Dial: [phone]

## 2021-05-30 ENCOUNTER — Other Ambulatory Visit: Payer: Self-pay

## 2021-05-30 ENCOUNTER — Ambulatory Visit (HOSPITAL_COMMUNITY): Payer: Medicare HMO | Attending: Family Medicine

## 2021-05-30 ENCOUNTER — Encounter (HOSPITAL_COMMUNITY): Payer: Self-pay

## 2021-05-30 ENCOUNTER — Encounter: Payer: Self-pay | Admitting: Nurse Practitioner

## 2021-05-30 ENCOUNTER — Ambulatory Visit (INDEPENDENT_AMBULATORY_CARE_PROVIDER_SITE_OTHER): Payer: Medicare HMO | Admitting: Nurse Practitioner

## 2021-05-30 VITALS — BP 132/73 | HR 89 | Ht 68.0 in | Wt 256.0 lb

## 2021-05-30 DIAGNOSIS — E1122 Type 2 diabetes mellitus with diabetic chronic kidney disease: Secondary | ICD-10-CM

## 2021-05-30 DIAGNOSIS — M25612 Stiffness of left shoulder, not elsewhere classified: Secondary | ICD-10-CM | POA: Insufficient documentation

## 2021-05-30 DIAGNOSIS — E782 Mixed hyperlipidemia: Secondary | ICD-10-CM | POA: Diagnosis not present

## 2021-05-30 DIAGNOSIS — R29898 Other symptoms and signs involving the musculoskeletal system: Secondary | ICD-10-CM | POA: Diagnosis not present

## 2021-05-30 DIAGNOSIS — M25512 Pain in left shoulder: Secondary | ICD-10-CM | POA: Diagnosis not present

## 2021-05-30 DIAGNOSIS — I10 Essential (primary) hypertension: Secondary | ICD-10-CM

## 2021-05-30 DIAGNOSIS — Z794 Long term (current) use of insulin: Secondary | ICD-10-CM

## 2021-05-30 DIAGNOSIS — N1832 Chronic kidney disease, stage 3b: Secondary | ICD-10-CM

## 2021-05-30 DIAGNOSIS — G8929 Other chronic pain: Secondary | ICD-10-CM | POA: Insufficient documentation

## 2021-05-30 NOTE — Patient Instructions (Signed)
Diabetes Mellitus and Nutrition, Adult When you have diabetes, or diabetes mellitus, it is very important to have healthy eating habits because your blood sugar (glucose) levels are greatly affected by what you eat and drink. Eating healthy foods in the right amounts, at about the same times every day, can help you:  Control your blood glucose.  Lower your risk of heart disease.  Improve your blood pressure.  Reach or maintain a healthy weight. What can affect my meal plan? Every person with diabetes is different, and each person has different needs for a meal plan. Your health care provider may recommend that you work with a dietitian to make a meal plan that is best for you. Your meal plan may vary depending on factors such as:  The calories you need.  The medicines you take.  Your weight.  Your blood glucose, blood pressure, and cholesterol levels.  Your activity level.  Other health conditions you have, such as heart or kidney disease. How do carbohydrates affect me? Carbohydrates, also called carbs, affect your blood glucose level more than any other type of food. Eating carbs naturally raises the amount of glucose in your blood. Carb counting is a method for keeping track of how many carbs you eat. Counting carbs is important to keep your blood glucose at a healthy level, especially if you use insulin or take certain oral diabetes medicines. It is important to know how many carbs you can safely have in each meal. This is different for every person. Your dietitian can help you calculate how many carbs you should have at each meal and for each snack. How does alcohol affect me? Alcohol can cause a sudden decrease in blood glucose (hypoglycemia), especially if you use insulin or take certain oral diabetes medicines. Hypoglycemia can be a life-threatening condition. Symptoms of hypoglycemia, such as sleepiness, dizziness, and confusion, are similar to symptoms of having too much  alcohol.  Do not drink alcohol if: ? Your health care provider tells you not to drink. ? You are pregnant, may be pregnant, or are planning to become pregnant.  If you drink alcohol: ? Do not drink on an empty stomach. ? Limit how much you use to:  0-1 drink a day for women.  0-2 drinks a day for men. ? Be aware of how much alcohol is in your drink. In the [place] one drink equals one 12 oz bottle of beer (355 mL), one 5 oz glass of wine (148 mL), or one 1 oz glass of hard liquor (44 mL). ? Keep yourself hydrated with water, diet soda, or unsweetened iced tea.  Keep in mind that regular soda, juice, and other mixers may contain a lot of sugar and must be counted as carbs. What are tips for following this plan? Reading food labels  Start by checking the serving size on the "Nutrition Facts" label of packaged foods and drinks. The amount of calories, carbs, fats, and other nutrients listed on the label is based on one serving of the item. Many items contain more than one serving per package.  Check the total grams (g) of carbs in one serving. You can calculate the number of servings of carbs in one serving by dividing the total carbs by 15. For example, if a food has 30 g of total carbs per serving, it would be equal to 2 servings of carbs.  Check the number of grams (g) of saturated fats and trans fats in one serving. Choose foods that have   a low amount or none of these fats.  Check the number of milligrams (mg) of salt (sodium) in one serving. Most people should limit total sodium intake to less than 2,300 mg per day.  Always check the nutrition information of foods labeled as "low-fat" or "nonfat." These foods may be higher in added sugar or refined carbs and should be avoided.  Talk to your dietitian to identify your daily goals for nutrients listed on the label. Shopping  Avoid buying canned, pre-made, or processed foods. These foods tend to be high in fat, sodium, and added  sugar.  Shop around the outside edge of the grocery store. This is where you will most often find fresh fruits and vegetables, bulk grains, fresh meats, and fresh dairy. Cooking  Use low-heat cooking methods, such as baking, instead of high-heat cooking methods like deep frying.  Cook using healthy oils, such as olive, canola, or sunflower oil.  Avoid cooking with butter, cream, or high-fat meats. Meal planning  Eat meals and snacks regularly, preferably at the same times every day. Avoid going long periods of time without eating.  Eat foods that are high in fiber, such as fresh fruits, vegetables, beans, and whole grains. Talk with your dietitian about how many servings of carbs you can eat at each meal.  Eat 4-6 oz (112-168 g) of lean protein each day, such as lean meat, chicken, fish, eggs, or tofu. One ounce (oz) of lean protein is equal to: ? 1 oz (28 g) of meat, chicken, or fish. ? 1 egg. ?  cup (62 g) of tofu.  Eat some foods each day that contain healthy fats, such as avocado, nuts, seeds, and fish.   What foods should I eat? Fruits Berries. Apples. Oranges. Peaches. Apricots. Plums. Grapes. Mango. Papaya. Pomegranate. Kiwi. Cherries. Vegetables Lettuce. Spinach. Leafy greens, including kale, chard, collard greens, and mustard greens. Beets. Cauliflower. Cabbage. Broccoli. Carrots. Green beans. Tomatoes. Peppers. Onions. Cucumbers. Brussels sprouts. Grains Whole grains, such as whole-wheat or whole-grain bread, crackers, tortillas, cereal, and pasta. Unsweetened oatmeal. Quinoa. Brown or wild rice. Meats and other proteins Seafood. Poultry without skin. Lean cuts of poultry and beef. Tofu. Nuts. Seeds. Dairy Low-fat or fat-free dairy products such as milk, yogurt, and cheese. The items listed above may not be a complete list of foods and beverages you can eat. Contact a dietitian for more information. What foods should I avoid? Fruits Fruits canned with  syrup. Vegetables Canned vegetables. Frozen vegetables with butter or cream sauce. Grains Refined white flour and flour products such as bread, pasta, snack foods, and cereals. Avoid all processed foods. Meats and other proteins Fatty cuts of meat. Poultry with skin. Breaded or fried meats. Processed meat. Avoid saturated fats. Dairy Full-fat yogurt, cheese, or milk. Beverages Sweetened drinks, such as soda or iced tea. The items listed above may not be a complete list of foods and beverages you should avoid. Contact a dietitian for more information. Questions to ask a health care provider  Do I need to meet with a diabetes educator?  Do I need to meet with a dietitian?  What number can I call if I have questions?  When are the best times to check my blood glucose? Where to find more information:  American Diabetes Association: diabetes.org  Academy of Nutrition and Dietetics: www.eatright.org  National Institute of Diabetes and Digestive and Kidney Diseases: www.niddk.nih.gov  Association of Diabetes Care and Education Specialists: www.diabeteseducator.org Summary  It is important to have healthy eating   habits because your blood sugar (glucose) levels are greatly affected by what you eat and drink.  A healthy meal plan will help you control your blood glucose and maintain a healthy lifestyle.  Your health care provider may recommend that you work with a dietitian to make a meal plan that is best for you.  Keep in mind that carbohydrates (carbs) and alcohol have immediate effects on your blood glucose levels. It is important to count carbs and to use alcohol carefully. This information is not intended to replace advice given to you by your health care provider. Make sure you discuss any questions you have with your health care provider. Document Revised: 08/18/2019 Document Reviewed: 08/18/2019 Elsevier Patient Education  2021 [employer]  

## 2021-05-30 NOTE — Therapy (Signed)
[facility] [address] [place], [place], [address] Phone: [phone]   Fax:  [phone]  Occupational Therapy Treatment  Patient Details  Name: Laura Atkins MRN: [id number] Date of Birth: [birth year] Referring Provider (OT): Tula Nakayama, MD   Encounter Date: 05/30/2021   OT End of Session - 05/30/21 1147     Visit Number 7    Number of Visits 12    Date for OT Re-Evaluation 06/12/21    Authorization Type [employer]    Authorization Time Period 12 visits approved (05/01/21-06/12/21)    Authorization - Visit Number 7    Authorization - Number of Visits 12    Progress Note Due on Visit 10    OT Start Time 0948    OT Stop Time 1027    OT Time Calculation (min) 39 min    Activity Tolerance Patient tolerated treatment well    Behavior During Therapy [facility] for tasks assessed/performed             Past Medical History:  Diagnosis Date   Alpha thalassemia trait 01/26/2010   02/2012: Nl CBC ex H&H-10.7/34.8, MCV-69    Anemia    Anxiety    Anxiety and depression    Cellulitis 05/09/2017   Depression    Diabetes mellitus    Foot pain, right 04/30/2013   GERD (gastroesophageal reflux disease)    Headache(784.0)    Hyperlipidemia    Hypertension    Iron deficiency 01/21/2017   Microcytic anemia 01/26/2010   02/2012: Nl CBC ex H&H-10.7/34.8, MCV-69    NECK PAIN, CHRONIC 10/21/2008   +chronic back pain     Obesity    Obstructive sleep apnea    Osteoarthritis    Left knee; right shoulder; chronic neck and back pain   Pruritus    PVD (peripheral vascular disease) ([place]) 01/28/2014   Seizures (HCC)    Shoulder pain, right 04/14/2015   Tremor    This started months ago after her seizure progressing to very poor hand writing   Urinary incontinence    UTI (urinary tract infection) 01/18/2013    Past Surgical History:  Procedure Laterality Date   ABDOMINAL HYSTERECTOMY     BREAST EXCISIONAL BIOPSY     Left; cyst   CATARACT  EXTRACTION Right    12/2017   CATARACT EXTRACTION W/ INTRAOCULAR LENS IMPLANT Left 09/07/2013   CHOLECYSTECTOMY     COLONOSCOPY     COLONOSCOPY N/A 07/20/2015   Procedure: COLONOSCOPY;  Surgeon: Rogene Houston, MD;  Location: [facility];  Service: Endoscopy;  Laterality: N/A;  930   EYE SURGERY Left 09/07/2013   cataract    There were no vitals filed for this visit.   Subjective Assessment - 05/30/21 1005     Subjective  S: This weekend was rough. Saturday I hurt a lot. It's better today though.    Currently in Pain? Yes    Pain Score 4     Pain Location Shoulder    Pain Orientation Left    Pain Descriptors / Indicators Sore    Pain Type Chronic pain    Pain Onset More than a month ago    Pain Frequency Constant    Aggravating Factors  unknown    Pain Relieving Factors heat, massage    Effect of Pain on Daily Activities mod effect on ADLs    Multiple Pain Sites No  [facility] OT Assessment - 05/30/21 1006       Assessment   Medical Diagnosis OA of left shoulder      Precautions   Precautions Fall    Precaution Comments Uses RW for mobility.                      OT Treatments/Exercises (OP) - 05/30/21 1006       Exercises   Exercises Shoulder      Shoulder Exercises: Supine   Protraction PROM;5 reps    Horizontal ABduction PROM;5 reps    External Rotation PROM;5 reps    Internal Rotation PROM;5 reps    Flexion PROM;5 reps    ABduction PROM;5 reps      Shoulder Exercises: Seated   Protraction AROM;10 reps    Horizontal ABduction AROM;10 reps;Limitations    Horizontal ABduction Limitations one arm only. low level    External Rotation AROM;10 reps    Internal Rotation AROM;10 reps    Flexion AROM;10 reps;Limitations    Flexion Limitations slightly under shoulder height    Abduction AROM;10 reps;Limitations    ABduction Limitations one arm only. to shoulder level.      Functional Reaching Activities   Mid Level Functional  reaching task completed standing using Squig[employer]. Placed and removed using LUE. Once seated rest break.      Manual Therapy   Manual Therapy Myofascial release    Manual therapy comments Manual therapy completed separate from exercises.    Myofascial Release Myofascial release and manual stretching completed to left upper arm and upper trapezius region to decrease fascial restrictions and increase joint mobility in a pain free zone.                    OT Education - 05/30/21 1146     Education Details Discussed the importance of proper seated posture when completing shoulder exercises and how it affects the shoulder alignment and ROM.    Person(s) Educated Patient;Spouse    Methods Explanation;Demonstration    Comprehension Verbalized understanding              OT Short Term Goals - 05/03/21 1111       OT SHORT TERM GOAL #1   Title Pt will be educated and verbalize independence with HEP in order to faciliate progress in therapy and increase her ability to use her LUE for 50% of self care tasks.    Time 3    Period Weeks    Status On-going    Target Date 05/22/21      OT SHORT TERM GOAL #2   Title Patient will increase her LUE P/ROM to [facility] in order to increase her ability to complete upper body dressing and bathing tasks.    Time 3    Period Weeks    Status On-going      OT SHORT TERM GOAL #3   Title Patient will increase her LUE strength to 3/10 or better in order to pick up lightweight items and stabilize in both hands without difficulty.    Time 3    Period Weeks    Status On-going      OT SHORT TERM GOAL #4   Title Pt will report a pain level of 6/10 or less in her LUE while completing bathing and dressing tasks.    Time 3    Period Weeks    Status On-going  OT Long Term Goals - 05/03/21 1111       OT LONG TERM GOAL #1   Title Patient will utilize her LUE as her non dominant extremity for 75% or more of daily tasks.    Time 6     Period Weeks    Status On-going      OT LONG TERM GOAL #2   Title Pt will increase her LUE strength to 4-/5 in order to complete basic meal prep and lifting of household items of moderate weight with less difficulty.    Time 6    Period Weeks    Status On-going      OT LONG TERM GOAL #3   Title Pt will increase her LUE A/ROM to [facility] in order to complete shoulder level reaching with less difficulty.    Time 6    Period Weeks    Status On-going      OT LONG TERM GOAL #4   Title Patient will report a decrease in LUE pain of approximately 4/10 or less when completing bathing and dressing tasks.    Time 6    Period Weeks    Status On-going                   Plan - 05/30/21 1148     Clinical Impression Statement A: Myofascial release completed to LUE to address min-mod fascial restrictions located primarily in the left upper arm region. Completed all A/ROM seated while providing modifications due to ROM available. Frequent VC for form and technique were provided during session. Patient able to complete functional reaching tasks just under shoulder level.    Body Structure / Function / Physical Skills ADL;UE functional use;Fascial restriction;Pain;ROM;Strength    Plan P: Update HEP to seated A/ROM. Complete A/ROM using chair with back support to help with posture. Functional reaching task with cones    Consulted and Agree with Plan of Care Patient             Patient will benefit from skilled therapeutic intervention in order to improve the following deficits and impairments:   Body Structure / Function / Physical Skills: ADL, UE functional use, Fascial restriction, Pain, ROM, Strength       Visit Diagnosis: Chronic left shoulder pain  Stiffness of left shoulder, not elsewhere classified  Other symptoms and signs involving the musculoskeletal system    Problem List Patient Active Problem List   Diagnosis Date Noted   Recurrent UTI 05/21/2021   Acute cystitis  with hematuria 04/16/2021   Pressure injury of right heel, unstageable ([place]) 01/06/2021   Polyneuropathy associated with underlying disease ([place]) 01/06/2021   Chronic kidney disease, stage 4 (severe) ([place]) 01/06/2021   Dementia ([place]) 07/13/2020   Cognitive deficits    Carotid bruit 05/26/2020   Hyperlipidemia associated with type 2 diabetes mellitus ([place]) 12/08/2019   Left knee pain 02/11/2017   Iron deficiency 01/21/2017   Vitamin D deficiency 10/15/2016   Urinary incontinence 10/15/2016   Limited mobility 05/27/2016   Type 2 diabetes mellitus with hyperglycemia ([place]) 02/04/2016   Sleep terror disorder 12/14/2015   Fecal incontinence 12/13/2015   Back pain of lumbar region with sciatica 04/15/2015   Spondylosis, cervical, with myelopathy 04/14/2015   OSA (obstructive sleep apnea) 04/10/2013   Peripheral neuropathy 03/12/2013   Hearing loss 11/06/2012   Seizure-like activity ([place]) 01/25/2012   UNSTEADY GAIT 11/07/2010   Alpha thalassemia ([place]) 01/26/2010   Morbid obesity ([place]) 04/21/2008   Anxiety and depression 04/21/2008  Essential hypertension 04/21/2008   GERD 04/21/2008   Osteoarthritis of left knee 04/21/2008    Tandi Hanko, Clarene Duke 05/30/2021, 11:51 AM  [facility] [address] [place], [place], [address] Phone: [phone]   Fax:  [phone]  Name: Laura Atkins MRN: [id number] Date of Birth: 05-30-1941

## 2021-05-30 NOTE — Progress Notes (Signed)
Endocrinology Consult Note       05/30/2021, 12:31 PM   Subjective:    Patient ID: Laura Atkins, female    DOB: Jun 13, 1941.  Laura Atkins is being seen in consultation for management of currently uncontrolled symptomatic diabetes requested by  Fayrene Helper, MD.   Past Medical History:  Diagnosis Date   Alpha thalassemia trait 01/26/2010   02/2012: Nl CBC ex H&H-10.7/34.8, MCV-69    Anemia    Anxiety    Anxiety and depression    Cellulitis 05/09/2017   Depression    Diabetes mellitus    Foot pain, right 04/30/2013   GERD (gastroesophageal reflux disease)    Headache(784.0)    Hyperlipidemia    Hypertension    Iron deficiency 01/21/2017   Microcytic anemia 01/26/2010   02/2012: Nl CBC ex H&H-10.7/34.8, MCV-69    NECK PAIN, CHRONIC 10/21/2008   +chronic back pain     Obesity    Obstructive sleep apnea    Osteoarthritis    Left knee; right shoulder; chronic neck and back pain   Pruritus    PVD (peripheral vascular disease) ([place]) 01/28/2014   Seizures (HCC)    Shoulder pain, right 04/14/2015   Tremor    This started months ago after her seizure progressing to very poor hand writing   Urinary incontinence    UTI (urinary tract infection) 01/18/2013    Past Surgical History:  Procedure Laterality Date   ABDOMINAL HYSTERECTOMY     BREAST EXCISIONAL BIOPSY     Left; cyst   CATARACT EXTRACTION Right    12/2017   CATARACT EXTRACTION W/ INTRAOCULAR LENS IMPLANT Left 09/07/2013   CHOLECYSTECTOMY     COLONOSCOPY     COLONOSCOPY N/A 07/20/2015   Procedure: COLONOSCOPY;  Surgeon: Rogene Houston, MD;  Location: [facility];  Service: Endoscopy;  Laterality: N/A;  930   EYE SURGERY Left 09/07/2013   cataract    Social History   Socioeconomic History   Marital status: Married    Spouse name: saunders   Number of children: 5   Years of education: 12   Highest education level: Not on file   Occupational History   Occupation: Disabled     Employer: RETIRED  Tobacco Use   Smoking status: Never   Smokeless tobacco: Never  Vaping Use   Vaping Use: Never used  Substance and Sexual Activity   Alcohol use: No    Alcohol/week: 0.0 standard drinks   Drug use: No   Sexual activity: Yes    Birth control/protection: Surgical  Other Topics Concern   Not on file  Social History Narrative   07/27/20 Patient lives at home with her husband [name] and dgtr- [name].  Patient is retired.    Right handed.    Five Children.    Caffeine- 2 daily   Social Determinants of Health   Financial Resource Strain: Not on file  Food Insecurity: No Food Insecurity   Worried About Charity fundraiser in the Last Year: Never true   Ran Out of Food in the Last Year: Never true  Transportation Needs: No Transportation Needs   Lack of Transportation (Medical): No  Lack of Transportation (Non-Medical): No  Physical Activity: Inactive   Days of Exercise per Week: 0 days   Minutes of Exercise per Session: 0 min  Stress: No Stress Concern Present   Feeling of Stress : Not at all  Social Connections: Moderately Integrated   Frequency of Communication with Friends and Family: More than three times a week   Frequency of Social Gatherings with Friends and Family: More than three times a week   Attends Religious Services: More than 4 times per year   Active Member of [employer] or Organizations: No   Attends Archivist Meetings: Never   Marital Status: Married    Family History  Problem Relation Age of Onset   Lung cancer Mother 10   Kidney disease Father    Diabetes Sister    Keloids Brother    ADD / ADHD Grandchild    Bipolar disorder Grandchild    Bipolar disorder Daughter    Seizures Daughter    Heart disease Daughter    Kidney disease Son    Neuropathy Son    Kidney disease Son    Edema Daughter    Breast cancer Daughter 71   Allergies Daughter    Alcohol abuse Neg Hx     Drug abuse Neg Hx     Outpatient Encounter Medications as of 05/30/2021  Medication Sig   amLODipine (NORVASC) 5 MG tablet TAKE 1 TABLET BY MOUTH EVERY DAY   aspirin 81 MG tablet Take 81 mg by mouth daily.   atorvastatin (LIPITOR) 40 MG tablet TAKE 1 TABLET BY MOUTH EVERY DAY   benazepril (LOTENSIN) 20 MG tablet TAKE 1 TABLET BY MOUTH EVERY DAY   buPROPion (WELLBUTRIN XL) 150 MG 24 hr tablet TAKE 1 TABLET BY MOUTH EVERY MORNING   Cholecalciferol 125 MCG (5000 UT) TABS Take 1 tablet by mouth daily.   diphenhydrAMINE (BENADRYL) 25 MG tablet Take 25 mg by mouth at bedtime.   donepezil (ARICEPT) 5 MG tablet TAKE 1 TABLET BY MOUTH AT BEDTIME   gabapentin (NEURONTIN) 400 MG capsule TAKE ONE CAPSULE BY MOUTH EVERY MORNING and TAKE ONE CAPSULE AT NOON and TAKE TWO CAPSULES AT BEDTIME   insulin NPH-regular Human (NOVOLIN 70/30 RELION) (70-30) 100 UNIT/ML injection 35 units with breakfast, and 15 units with supper.   Insulin Syringe-Needle U-100 (INSULIN SYRINGE 1CC/31GX5/16") 31G X 5/16" 1 ML MISC 1 each by Does not apply route daily. Use to inject insulin daily; E11.65   Multiple Vitamin (MULTIVITAMIN) capsule Take 1 capsule by mouth daily.   omeprazole (PRILOSEC) 20 MG capsule TAKE ONE CAPSULE BY MOUTH EVERY DAY   potassium chloride SA (KLOR-CON) 20 MEQ tablet TAKE 1 TABLET BY MOUTH TWICE DAILY   sertraline (ZOLOFT) 100 MG tablet Take 1 tablet (100 mg total) by mouth daily.   tiZANidine (ZANAFLEX) 2 MG tablet Take 1 tablet (2 mg total) by mouth every 6 (six) hours as needed for muscle spasms.   torsemide (DEMADEX) 20 MG tablet TAKE 40 MG EVERY OTHER DAY ALTERNATING 60 MG EVERY OTHER DAY   Accu-Chek FastClix Lancets MISC 1 each by Does not apply route 4 (four) times daily. Use to monitor glucose levels 4 times daily; E11.65   Alcohol Swabs (B-D SINGLE USE SWABS REGULAR) PADS 1 each by Does not apply route 4 (four) times daily. Use to prep site for glucose monitoring 4 times daily; E11.65   Blood  Glucose Monitoring Suppl (ACCU-CHEK GUIDE ME) w/Device KIT 1 each by Does not apply route  4 (four) times daily. Use to monitor glucose levels 4 times daily; E11.65   Continuous Blood Gluc Sensor (FREESTYLE LIBRE 14 DAY SENSOR) MISC 1 Device by Other route every 14 (fourteen) days. (Patient not taking: Reported on 05/30/2021)   glucose blood (ACCU-CHEK GUIDE) test strip 1 each by Other route 4 (four) times daily.   UNABLE TO FIND Walker x 1  DX unsteady gait, osteoarthritis of left knee   UNABLE TO FIND Elevated Toliet Seat x 1 DX: unsteady gait, back pain, osteoarthritis of left knee   UNABLE TO FIND Standing upright walker x 1  DX M54.40, M19.90   UNABLE TO FIND Incontinence pads and supplies   UNABLE TO FIND Rollator Walker   UNABLE TO FIND XL Tranquility Briefs for incontinence.   [DISCONTINUED] nitrofurantoin, macrocrystal-monohydrate, (MACROBID) 100 MG capsule Take 1 capsule (100 mg total) by mouth 2 (two) times daily.   No facility-administered encounter medications on file as of 05/30/2021.    ALLERGIES: Allergies  Allergen Reactions   Penicillins Shortness Of Breath, Itching and Rash   Prednisone Shortness Of Breath, Itching and Rash   Propoxyphene N-Acetaminophen Itching and Nausea And Vomiting   Sulfa Antibiotics Itching and Nausea And Vomiting    VACCINATION STATUS: Immunization History  Administered Date(s) Administered   Fluad Quad(high Dose 65+) 05/20/2019   H1N1 07/15/2008   Influenza Split 06/17/2012   Influenza Whole 07/03/2007, 06/17/2009, 06/07/2011   Influenza, High Dose Seasonal PF 07/17/2018   Influenza,inj,Quad PF,6+ Mos 07/13/2013, 09/06/2014, 08/10/2015, 05/23/2016, 05/22/2017   Pneumococcal Conjugate-13 05/03/2014   Pneumococcal Polysaccharide-23 09/08/2010   Td 09/08/2010   Tdap 07/17/2018   Unspecified SARS-COV-2 Vaccination 09/25/2019, 10/26/2019    Diabetes She presents for her initial diabetic visit. She has type 2 diabetes mellitus. Onset time:  poor historian- was diagnosed in her early 5s. Her disease course has been worsening. There are no hypoglycemic associated symptoms. Associated symptoms include blurred vision, fatigue, polydipsia and polyuria. There are no hypoglycemic complications. Symptoms are stable. Diabetic complications include a CVA, nephropathy, peripheral neuropathy and PVD. [facility] daughter reports CVA in past) Risk factors for coronary artery disease include diabetes mellitus, dyslipidemia, family history, hypertension, obesity, post-menopausal and sedentary lifestyle. Current diabetic treatment includes insulin injections. She is compliant with treatment most of the time. Her weight is fluctuating minimally. She is following a generally unhealthy diet. When asked about meal planning, she reported none. She has not had a previous visit with a dietitian. She rarely participates in exercise. Her home blood glucose trend is fluctuating minimally. Her breakfast blood glucose range is generally >200 mg/dl. Her dinner blood glucose range is generally >200 mg/dl. Her bedtime blood glucose range is generally >200 mg/dl. Her overall blood glucose range is >200 mg/dl. (She presents today for her consultation, accompanied by her granddaughter, with no meter or logs to review.  Her most recent A1c was 10% on 04/07/21, steadily increasing.  Her family helps her to check her glucose 3-4 times daily and eat 3 routine meals (sometimes which she skips due to lack of appetite).  She admits to eating snacks and consuming sodas, juices, and teas as well.  She is unable to exercise optimally due to physical limitations as she walks with a walker.  Her granddaughter also mentions she has memory impairment which affects her compliance to diabetic regimen.  She denies any recent hypoglycemia.) An ACE inhibitor/angiotensin II receptor blocker is being taken. She sees a podiatrist.Eye exam is current.  Hypertension This is a chronic problem. The  current episode  started more than 1 year ago. The problem has been resolved since onset. The problem is controlled. Associated symptoms include blurred vision. There are no associated agents to hypertension. Risk factors for coronary artery disease include diabetes mellitus, dyslipidemia, family history, obesity, post-menopausal state and sedentary lifestyle. Past treatments include calcium channel blockers and ACE inhibitors. The current treatment provides moderate improvement. Compliance problems include diet and exercise.  Hypertensive end-organ damage includes kidney disease, CVA and PVD. Identifiable causes of hypertension include chronic renal disease and sleep apnea.  Hyperlipidemia This is a chronic problem. The current episode started more than 1 year ago. The problem is controlled. Recent lipid tests were reviewed and are normal. Exacerbating diseases include chronic renal disease, diabetes and obesity. There are no known factors aggravating her hyperlipidemia. Current antihyperlipidemic treatment includes statins. The current treatment provides moderate improvement of lipids. Compliance problems include adherence to diet and adherence to exercise.  Risk factors for coronary artery disease include diabetes mellitus, dyslipidemia, family history, hypertension, obesity, a sedentary lifestyle and post-menopausal.    Review of systems  Constitutional: + Minimally fluctuating body weight, current Body mass index is 38.92 kg/m., + fatigue, no subjective hyperthermia, no subjective hypothermia Eyes: no blurry vision, no xerophthalmia ENT: no sore throat, no nodules palpated in throat, no dysphagia/odynophagia, no hoarseness Cardiovascular: no chest pain, no shortness of breath, no palpitations, + intermittent leg swelling Respiratory: no cough, no shortness of breath Gastrointestinal: no nausea/vomiting/diarrhea Genitourinary: + polyuria and incontinence Musculoskeletal: Hx chronic pain; walks with walker Skin:  no rashes, no hyperemia Neurological: no tremors, no numbness, no tingling, no dizziness Psychiatric: no depression, no anxiety  Objective:     BP 132/73   Pulse 89   Ht 5' 8"  (1.727 m)   Wt 256 lb (116.1 kg)   BMI 38.92 kg/m   Wt Readings from Last 3 Encounters:  05/30/21 256 lb (116.1 kg)  05/18/21 260 lb (117.9 kg)  04/13/21 256 lb 6.4 oz (116.3 kg)     BP Readings from Last 3 Encounters:  05/30/21 132/73  05/18/21 136/63  04/13/21 120/77     Physical Exam- Limited  Constitutional:  Body mass index is 38.92 kg/m. , not in acute distress, normal state of mind Eyes:  EOMI, no exophthalmos Neck: Supple Cardiovascular: irregular rhythm, no murmurs, rubs, or gallops, + nonpitting edema to BLE Respiratory: Adequate breathing efforts, no crackles, rales, rhonchi, or wheezing Musculoskeletal: no gross deformities, strength intact in all four extremities, no gross restriction of joint movements, walks with walker Skin:  no rashes, no hyperemia Neurological: no tremor with outstretched hands    CMP ( most recent) CMP     Component Value Date/Time   NA 140 04/07/2021 1030   K 4.8 04/07/2021 1030   CL 102 04/07/2021 1030   CO2 23 04/07/2021 1030   GLUCOSE 195 (H) 04/07/2021 1030   GLUCOSE 145 (H) 06/03/2020 0210   BUN 36 (H) 04/07/2021 1030   CREATININE 1.55 (H) 04/07/2021 1030   CREATININE 1.30 (H) 12/01/2019 1137   CALCIUM 9.9 04/07/2021 1030   PROT 7.3 04/07/2021 1030   ALBUMIN 4.4 04/07/2021 1030   AST 14 04/07/2021 1030   ALT 16 04/07/2021 1030   ALKPHOS 137 (H) 04/07/2021 1030   BILITOT 0.2 04/07/2021 1030   GFRNONAA 47 (L) 06/03/2020 0210   GFRNONAA 40 (L) 12/01/2019 1137   GFRAA 55 (L) 06/03/2020 0210   GFRAA 46 (L) 12/01/2019 1137     Diabetic Labs (most recent):  Lab Results  Component Value Date   HGBA1C 10.0 (H) 04/07/2021   HGBA1C 9.5 (A) 11/17/2020   HGBA1C 8.3 (A) 06/30/2020     Lipid Panel ( most recent) Lipid Panel     Component  Value Date/Time   CHOL 144 04/07/2021 1030   TRIG 111 04/07/2021 1030   HDL 58 04/07/2021 1030   CHOLHDL 2.6 12/01/2019 1137   VLDL 16 05/14/2017 1039   LDLCALC 66 04/07/2021 1030   LDLCALC 72 12/01/2019 1137   LABVLDL 20 04/07/2021 1030      Lab Results  Component Value Date   TSH 1.129 05/27/2020   TSH 2.24 12/01/2019   TSH 1.12 11/26/2018   TSH 2.01 05/14/2017   TSH 2.13 02/21/2016   TSH 0.989 08/18/2014   TSH 0.520 01/23/2012   TSH 1.315 09/08/2010   TSH 1.674 05/16/2010   TSH 1.184 03/10/2009           Assessment & Plan:   1) Uncontrolled Type 2 Diabetes with CKD stage 3b-  She presents today for her consultation, accompanied by her granddaughter, with no meter or logs to review.  Her most recent A1c was 10% on 04/07/21, steadily increasing.  Her family helps her to check her glucose 3-4 times daily and eat 3 routine meals (sometimes which she skips due to lack of appetite).  She admits to eating snacks and consuming sodas, juices, and teas as well.  She is unable to exercise optimally due to physical limitations as she walks with a walker.  Her granddaughter also mentions she has memory impairment which affects her compliance to diabetic regimen.  She denies any recent hypoglycemia.  - Laura Atkins has currently uncontrolled symptomatic type 2 DM since 80 years of age, with most recent A1c of 10 %.   -Recent labs reviewed.  She has seen Dr. Theador Hawthorne in the past for CKD.  - I had a long discussion with her about the progressive nature of diabetes and the pathology behind its complications.  -her diabetes is complicated by CKD, CVA, neuropathy, PVD and she remains at a high risk for more acute and chronic complications which include CAD, CVA, CKD, retinopathy, and neuropathy. These are all discussed in detail with her.  - I have counseled her on diet and weight management by adopting a carbohydrate restricted/protein rich diet. Patient is encouraged to switch to  unprocessed or minimally processed complex starch and increased protein intake (animal or plant source), fruits, and vegetables. -  she is advised to stick to a routine mealtimes to eat 3 meals a day and avoid unnecessary snacks (to snack only to correct hypoglycemia).   - she acknowledges that there is a room for improvement in her food and drink choices. - Suggestion is made for her to avoid simple carbohydrates from her diet including Cakes, Sweet Desserts, Ice Cream, Soda (diet and regular), Sweet Tea, Candies, Chips, Cookies, Store Bought Juices, Alcohol in Excess of 1-2 drinks a day, Artificial Sweeteners, Coffee Creamer, and "Sugar-free" Products. This will help patient to have more stable blood glucose profile and potentially avoid unintended weight gain.  - I have approached her with the following individualized plan to manage her diabetes and patient agrees:   -Given her age and comorbidities, avoiding hypoglycemia will be the number 1 priority in her diabetes management.  An A1c of 7-7.5 will be acceptable for her.  -She is advised to continue her current regimen of premixed insulin NPH 70/30 35 units with breakfast and  15 units with supper if glucose is above 90 and she is eating.  -she is encouraged to start monitoring glucose 4 times daily, before meals and before bed, to log their readings on the clinic sheets provided, and bring them to review at follow up appointment in 2 weeks.  - she is warned not to take insulin without proper monitoring per orders. - Adjustment parameters are given to her for hypo and hyperglycemia in writing. - she is encouraged to call clinic for blood glucose levels less than 70 or above 300 mg /dl.  - she is not a candidate for Metformin or SGLT2i due to concurrent renal insufficiency.  - she will be considered for incretin therapy as appropriate next visit.  - Specific targets for  A1c; LDL, HDL, and Triglycerides were discussed with the patient.  2)  Blood Pressure /Hypertension:  her blood pressure is controlled to target.   she is advised to continue her current medications including Norvasc 5 mg p.o. daily with breakfast, Benazepril 20 mg po daily and Torsemide 40 mg and 60 mg daily on alternating days.  3) Lipids/Hyperlipidemia:    Review of her recent lipid panel from 04/07/21 showed controlled LDL at 66 .  she is advised to continue Lipitor 40 mg daily at bedtime.  Side effects and precautions discussed with her.  4)  Weight/Diet:  her Body mass index is 38.92 kg/m.  -  clearly complicating her diabetes care.   she is a candidate for weight loss. I discussed with her the fact that loss of 5 - 10% of her  current body weight will have the most impact on her diabetes management.  Exercise, and detailed carbohydrates information provided  -  detailed on discharge instructions.  5) Chronic Care/Health Maintenance: -she is on ACEI/ARB and Statin medications and is encouraged to initiate and continue to follow up with Ophthalmology, Dentist, Podiatrist at least yearly or according to recommendations, and advised to stay away from smoking. I have recommended yearly flu vaccine and pneumonia vaccine at least every 5 years; moderate intensity exercise for up to 150 minutes weekly; and sleep for at least 7 hours a day.  - she is advised to maintain close follow up with Fayrene Helper, MD for primary care needs, as well as her other providers for optimal and coordinated care.   - Time spent in this patient care: 60 min, of which > 50% was spent in counseling her about her diabetes and the rest reviewing her blood glucose logs, discussing her hypoglycemia and hyperglycemia episodes, reviewing her current and previous labs/studies (including abstraction from other facilities) and medications doses and developing a long term treatment plan based on the latest standards of care/guidelines; and documenting her care.    Please refer to Patient  Instructions for Blood Glucose Monitoring and Insulin/Medications Dosing Guide" in media tab for additional information. Please also refer to "Patient Self Inventory" in the Media tab for reviewed elements of pertinent patient history.  Laura Atkins participated in the discussions, expressed understanding, and voiced agreement with the above plans.  All questions were answered to her satisfaction. she is encouraged to contact clinic should she have any questions or concerns prior to her return visit.     Follow up plan: - Return in about 2 weeks (around 06/13/2021) for Diabetes F/U, Bring meter and logs.    Laura Atkins, [facility] [address] [place], [place] [address] Phone: [phone] Fax: [phone]  05/30/2021, 12:31 PM

## 2021-05-31 ENCOUNTER — Other Ambulatory Visit: Payer: Self-pay | Admitting: Endocrinology

## 2021-05-31 DIAGNOSIS — E1165 Type 2 diabetes mellitus with hyperglycemia: Secondary | ICD-10-CM

## 2021-05-31 DIAGNOSIS — Z794 Long term (current) use of insulin: Secondary | ICD-10-CM

## 2021-06-01 ENCOUNTER — Ambulatory Visit (HOSPITAL_COMMUNITY): Payer: Medicare HMO | Admitting: Occupational Therapy

## 2021-06-01 ENCOUNTER — Ambulatory Visit (INDEPENDENT_AMBULATORY_CARE_PROVIDER_SITE_OTHER): Payer: Medicare HMO | Admitting: Pharmacist

## 2021-06-01 ENCOUNTER — Encounter (HOSPITAL_COMMUNITY): Payer: Self-pay | Admitting: Occupational Therapy

## 2021-06-01 ENCOUNTER — Other Ambulatory Visit: Payer: Self-pay

## 2021-06-01 DIAGNOSIS — R29898 Other symptoms and signs involving the musculoskeletal system: Secondary | ICD-10-CM | POA: Diagnosis not present

## 2021-06-01 DIAGNOSIS — E785 Hyperlipidemia, unspecified: Secondary | ICD-10-CM

## 2021-06-01 DIAGNOSIS — I10 Essential (primary) hypertension: Secondary | ICD-10-CM

## 2021-06-01 DIAGNOSIS — E1165 Type 2 diabetes mellitus with hyperglycemia: Secondary | ICD-10-CM

## 2021-06-01 DIAGNOSIS — G8929 Other chronic pain: Secondary | ICD-10-CM

## 2021-06-01 DIAGNOSIS — Z794 Long term (current) use of insulin: Secondary | ICD-10-CM

## 2021-06-01 DIAGNOSIS — M25512 Pain in left shoulder: Secondary | ICD-10-CM | POA: Diagnosis not present

## 2021-06-01 DIAGNOSIS — M25612 Stiffness of left shoulder, not elsewhere classified: Secondary | ICD-10-CM

## 2021-06-01 DIAGNOSIS — E1169 Type 2 diabetes mellitus with other specified complication: Secondary | ICD-10-CM

## 2021-06-01 DIAGNOSIS — N184 Chronic kidney disease, stage 4 (severe): Secondary | ICD-10-CM

## 2021-06-01 NOTE — Chronic Care Management (AMB) (Signed)
Chronic Care Management Pharmacy Note  06/01/2021 Name:  Laura Atkins MRN:  [id number] DOB:  [birth year]  Summary:  Patient's daughter reports that the patient is only taking 20 units of Novolin 70/30 subcutaneously once daily because she feels like her mother is being given too much insulin. She reports that the patient feels bad when her blood glucose drops to ~115 and she does not want tight blood glucose control. A1c goal <8% may be reasonable. Discussed that a blood glucose of 115 is not considered low and that it will take some time for her body to adjust to normoglycemia since she has been hyperglycemic for a while. Instructed patient to discuss insulin requirements with endocrinology and make adjustments based off blood glucose readings and how much insulin she is currently taking.  Patient identified as a good candidate for a GLP-1 receptor agonist given reduction in cardiovascular disease, slowed chronic kidney disease progression, low risk of hypoglycemia, increased satiety, and weight loss. May be able to improve blood glucose and decrease insulin requirements with a low risk of hypoglycemia.  Subjective: Laura Atkins is an 80 y.o. year old female who is a primary patient of Moshe Cipro, Norwood Levo, MD.  The CCM team was consulted for assistance with disease management and care coordination needs.    Engaged with patient and daughter [name]) by telephone for initial visit in response to provider referral for pharmacy case management and/or care coordination services.   Consent to Services:  The patient was given the following information about Chronic Care Management services today, agreed to services, and gave verbal consent: 1. CCM service includes personalized support from designated clinical staff supervised by the primary care provider, including individualized plan of care and coordination with other care providers 2. 24/7 contact phone numbers for assistance for urgent and  routine care needs. 3. Service will only be billed when office clinical staff spend 20 minutes or more in a month to coordinate care. 4. Only one practitioner may furnish and bill the service in a calendar month. 5.The patient may stop CCM services at any time (effective at the end of the month) by phone call to the office staff. 6. The patient will be responsible for cost sharing (co-pay) of up to 20% of the service fee (after annual deductible is met). Patient agreed to services and consent obtained.  Patient Care Team: Fayrene Helper, MD as PCP - General Branch, Alphonse Guild, MD as PCP - Cardiology (Cardiology) Marcial Pacas, MD as Consulting Physician (Neurology) Renato Shin, MD as Consulting Physician (Endocrinology) Cloria Spring, MD as Consulting Physician ([place]) Alonza Smoker, LCSW as Social Worker (Psychiatry) Clent Jacks, MD as Consulting Physician (Ophthalmology) Beryle Lathe, [facility] (Pharmacist)  Objective:  Lab Results  Component Value Date   CREATININE 1.55 (H) 04/07/2021   CREATININE 1.13 (H) 06/03/2020   CREATININE 1.17 (H) 06/02/2020    Lab Results  Component Value Date   HGBA1C 10.0 (H) 04/07/2021   Last diabetic Eye exam:  Lab Results  Component Value Date/Time   HMDIABEYEEXA Retinopathy (A) 02/15/2021 12:00 AM    Last diabetic Foot exam: No results found for: HMDIABFOOTEX      Component Value Date/Time   CHOL 144 04/07/2021 1030   TRIG 111 04/07/2021 1030   HDL 58 04/07/2021 1030   CHOLHDL 2.6 12/01/2019 1137   VLDL 16 05/14/2017 1039   LDLCALC 66 04/07/2021 1030   LDLCALC 72 12/01/2019 1137    Hepatic Function Latest  Ref Rng & Units 04/07/2021 06/03/2020 05/28/2020  Total Protein 6.0 - 8.5 g/dL 7.3 7.1 7.0  Albumin 3.7 - 4.7 g/dL 4.4 3.6 3.6  AST 0 - 40 IU/L 14 35 24  ALT 0 - 32 IU/L 16 38 23  Alk Phosphatase 44 - 121 IU/L 137(H) 76 78  Total Bilirubin 0.0 - 1.2 mg/dL 0.2 0.5 0.4  Bilirubin, Direct 0.0 - 0.3 mg/dL - - -     Lab Results  Component Value Date/Time   TSH 1.129 05/27/2020 06:28 PM   TSH 2.24 12/01/2019 11:37 AM   TSH 1.12 11/26/2018 09:45 AM    CBC Latest Ref Rng & Units 04/07/2021 06/03/2020 06/02/2020  WBC 3.4 - 10.8 x10E3/uL 6.1 6.3 6.6  Hemoglobin 11.1 - 15.9 g/dL 8.5(L) 9.6(L) 10.2(L)  Hematocrit 34.0 - 46.6 % 28.3(L) 33.7(L) 35.0(L)  Platelets 150 - 450 x10E3/uL 225 170 168    Lab Results  Component Value Date/Time   VD25OH 38 12/01/2019 11:37 AM   VD25OH 15 (L) 11/26/2018 09:45 AM    Clinical ASCVD: No  The 10-year ASCVD risk score (Arnett DK, et al., 2019) is: 29.3%   Values used to calculate the score:     Age: 45 years     Sex: Female     Is Non-Hispanic African American: Yes     Diabetic: Yes     Tobacco smoker: No     Systolic Blood Pressure: 628 mmHg     Is BP treated: Yes     HDL Cholesterol: 58 mg/dL     Total Cholesterol: 144 mg/dL    Social History   Tobacco Use  Smoking Status Never  Smokeless Tobacco Never   BP Readings from Last 3 Encounters:  05/30/21 132/73  05/18/21 136/63  04/13/21 120/77   Pulse Readings from Last 3 Encounters:  05/30/21 89  05/18/21 80  04/13/21 90   Wt Readings from Last 3 Encounters:  05/30/21 256 lb (116.1 kg)  05/18/21 260 lb (117.9 kg)  04/13/21 256 lb 6.4 oz (116.3 kg)    Assessment: Review of patient past medical history, allergies, medications, health status, including review of consultants reports, laboratory and other test data, was performed as part of comprehensive evaluation and provision of chronic care management services.   SDOH:  (Social Determinants of Health) assessments and interventions performed:    CCM Care Plan  Allergies  Allergen Reactions   Penicillins Shortness Of Breath, Itching and Rash   Prednisone Shortness Of Breath, Itching and Rash   Propoxyphene N-Acetaminophen Itching and Nausea And Vomiting   Sulfa Antibiotics Itching and Nausea And Vomiting    Medications Reviewed Today      Reviewed by Beryle Lathe, [facility] (Pharmacist) on 06/01/21 at [place] List Status: <None>   Medication Order Taking? Sig Documenting Provider Last Dose Status Informant  Accu-Chek FastClix Lancets MISC [id number]  1 each by Does not apply route 4 (four) times daily. Use to monitor glucose levels 4 times daily; E11.65 Renato Shin, MD  Active Family Member  ACCU-CHEK GUIDE test strip [id number]  USE TO TEST BLOOD SUGAR FOUR TIMES DAILY Renato Shin, MD  Active   Alcohol Swabs (B-D SINGLE USE SWABS REGULAR) PADS [id number]  1 each by Does not apply route 4 (four) times daily. Use to prep site for glucose monitoring 4 times daily; E11.65 Renato Shin, MD  Active Family Member  amLODipine ([place]) 5 MG tablet [id number] Yes TAKE 1 TABLET BY MOUTH EVERY DAY Fayrene Helper,  MD Taking Active   aspirin 81 MG tablet [id number] Yes Take 81 mg by mouth daily. [provider] Taking Active Family Member  atorvastatin (LIPITOR) 40 MG tablet [id number] Yes TAKE 1 TABLET BY MOUTH EVERY DAY Fayrene Helper, MD Taking Active   benazepril (LOTENSIN) 20 MG tablet [id number] Yes TAKE 1 TABLET BY MOUTH EVERY DAY Fayrene Helper, MD Taking Active   Blood Glucose Monitoring Suppl (ACCU-CHEK GUIDE ME) w/Device KIT [id number]  1 each by Does not apply route 4 (four) times daily. Use to monitor glucose levels 4 times daily; E11.65 Renato Shin, MD  Active Family Member  buPROPion [facility] XL) 150 MG 24 hr tablet [id number] Yes TAKE 1 TABLET BY MOUTH EVERY MORNING Cloria Spring, MD Taking Active   Cholecalciferol 125 MCG (5000 UT) TABS [id number] Yes Take 1 tablet by mouth daily. [provider] Taking Active Family Member  Continuous Blood Gluc Sensor (FREESTYLE LIBRE 14 DAY SENSOR) [place] [id number] No 1 Device by Other route every 14 (fourteen) days.  Patient not taking: No sig reported   Renato Shin, MD Not Taking Active   diphenhydrAMINE (BENADRYL) 25 MG tablet [id number] Yes Take  25 mg by mouth at bedtime. [provider] Taking Active Family Member  donepezil (ARICEPT) 5 MG tablet [id number] Yes TAKE 1 TABLET BY MOUTH AT BEDTIME Fayrene Helper, MD Taking Active   ferrous sulfate 325 (65 FE) MG EC tablet [id number] Yes Take 1 tablet by mouth 2 (two) times daily with a meal. [provider] Taking Active   gabapentin (NEURONTIN) 400 MG capsule [id number] Yes TAKE ONE CAPSULE BY MOUTH EVERY MORNING and TAKE ONE CAPSULE AT NOON and TAKE TWO CAPSULES AT BEDTIME Fayrene Helper, MD Taking Active   insulin NPH-regular Human (NOVOLIN 70/30 RELION) (70-30) 100 UNIT/ML injection [id number] Yes 35 units with breakfast, and 15 units with supper. Renato Shin, MD Taking Active            Med Note Waldo Laine, [place] Jun 01, 2021  4:48 PM) Patient's daughter reports that the patient is only taking 20 units subcutaneously once daily because she feels like her mother is being given too much insulin. She reports that the patient feels bad when her blood glucose drops to ~115 and she does not want tight blood glucose control.  Insulin Syringe-Needle U-100 (INSULIN SYRINGE 1CC/31GX5/16") 31G X 5/16" 1 ML MISC [id number]  1 each by Does not apply route daily. Use to inject insulin daily; E11.65 Renato Shin, MD  Active Family Member  Multiple Vitamin (MULTIVITAMIN) capsule [id number] No Take 1 capsule by mouth daily.  Patient not taking: Reported on 06/01/2021   [provider] Not Taking Active Family Member  omeprazole (PRILOSEC) 20 MG capsule [id number] Yes TAKE ONE CAPSULE BY MOUTH EVERY DAY Fayrene Helper, MD Taking Active   potassium chloride SA (KLOR-CON) 20 MEQ tablet [id number] Yes TAKE 1 TABLET BY MOUTH TWICE DAILY Fayrene Helper, MD Taking Active   sertraline (ZOLOFT) 100 MG tablet [id number] Yes Take 1 tablet (100 mg total) by mouth daily. Cloria Spring, MD Taking Active   tiZANidine (ZANAFLEX) 2 MG tablet [id number] Yes Take 1 tablet (2  mg total) by mouth every 6 (six) hours as needed for muscle spasms. Scot Jun, FNP Taking Active   torsemide (DEMADEX) 20 MG tablet [id number] Yes TAKE 40 MG EVERY OTHER DAY ALTERNATING 60 MG EVERY OTHER DAY Branch, Alphonse Guild, MD Taking Active   UNABLE TO  FIND [id number]  Walker x 1  DX unsteady gait, osteoarthritis of left knee Fayrene Helper, MD  Active   UNABLE TO FIND [id number]  Elevated [place] Seat x 1 DX: unsteady gait, back pain, osteoarthritis of left knee Fayrene Helper, MD  Active   UNABLE TO FIND [id number]  Standing upright walker x 1  DX M54.40, M19.90 Fayrene Helper, MD  Active   UNABLE TO FIND [id number]  Incontinence pads and supplies Noreene Larsson, NP  Active   UNABLE TO FIND [id number]  [place], [place], MD  Active   UNABLE TO FIND [id number]  XL Tranquility Briefs for incontinence. Fayrene Helper, MD  Active   Med List Note Perlie Mayo, NP 02/06/19 1038):                Patient Active Problem List   Diagnosis Date Noted   Recurrent UTI 05/21/2021   Acute cystitis with hematuria 04/16/2021   Pressure injury of right heel, unstageable ([place]) 01/06/2021   Polyneuropathy associated with underlying disease ([place]) 01/06/2021   Chronic kidney disease, stage 4 (severe) ([place]) 01/06/2021   Dementia ([place]) 07/13/2020   Cognitive deficits    Carotid bruit 05/26/2020   Hyperlipidemia associated with type 2 diabetes mellitus ([place]) 12/08/2019   Left knee pain 02/11/2017   Iron deficiency 01/21/2017   Vitamin D deficiency 10/15/2016   Urinary incontinence 10/15/2016   Limited mobility 05/27/2016   Type 2 diabetes mellitus with hyperglycemia ([place]) 02/04/2016   Sleep terror disorder 12/14/2015   Fecal incontinence 12/13/2015   Back pain of lumbar region with sciatica 04/15/2015   Spondylosis, cervical, with myelopathy 04/14/2015   OSA (obstructive sleep apnea) 04/10/2013   Peripheral neuropathy 03/12/2013   Hearing loss  11/06/2012   Seizure-like activity ([place]) 01/25/2012   UNSTEADY GAIT 11/07/2010   Alpha thalassemia ([place]) 01/26/2010   Morbid obesity ([place]) 04/21/2008   Anxiety and depression 04/21/2008   Essential hypertension 04/21/2008   GERD 04/21/2008   Osteoarthritis of left knee 04/21/2008    Immunization History  Administered Date(s) Administered   Fluad Quad(high Dose 65+) 05/20/2019   H1N1 07/15/2008   Influenza Split 06/17/2012   Influenza Whole 07/03/2007, 06/17/2009, 06/07/2011   Influenza, High Dose Seasonal PF 07/17/2018   Influenza,inj,Quad PF,6+ Mos 07/13/2013, 09/06/2014, 08/10/2015, 05/23/2016, 05/22/2017   Pneumococcal Conjugate-13 05/03/2014   Pneumococcal Polysaccharide-23 09/08/2010   Td 09/08/2010   Tdap 07/17/2018   Unspecified SARS-COV-2 Vaccination 09/25/2019, 10/26/2019    Conditions to be addressed/monitored: HTN, HLD, DMII, and CKD Stage 3b  Care Plan : Medication Management  Updates made by Beryle Lathe, [place] since 06/01/2021 12:00 AM     Problem: HTN, T2DM, CKD, HLD   Priority: High  Onset Date: 06/01/2021     Long-Range Goal: Disease Progression Prevention   Start Date: 06/01/2021  Expected End Date: 08/30/2021  This Visit's Progress: On track  Priority: High  Note:   Current Barriers:  Unable to independently monitor therapeutic efficacy Unable to achieve control of diabetes  Pharmacist Clinical Goal(s):  Over the next 90 days, patient will Achieve adherence to monitoring guidelines and medication adherence to achieve therapeutic efficacy Achieve control of diabetes as evidenced by improved fasting blood sugar and improved A1c through collaboration with PharmD and provider.   Interventions: 1:1 collaboration with Fayrene Helper, MD regarding development and update of comprehensive plan of care as evidenced by provider attestation and co-signature Inter-disciplinary care team collaboration (see longitudinal plan of care) Comprehensive  medication review performed; medication list updated in electronic medical record  Type 2 Diabetes (Follows with Ms Marinus Maw @ endocrinology : Uncontrolled; Most recent A1c 10.0%. Given patient's other comorbidities, A1c goal <8% may be reasonable Current medications: Novolin 70/30 35 units subcutaneously before breakfast and 15 units subcutaneously before dinner No metformin given renal insuffiencey. Avoid SGLT2 inhibitors given recurrent UTIs Intolerances: none Taking medications as directed: no, patient's daughter reports that the patient is only taking 20 units of Novolin 70/30 subcutaneously once daily because she feels like her mother is being given too much insulin. She reports that the patient feels bad when her blood glucose drops to ~115 and she does not want tight blood glucose control. Side effects thought to be attributed to current medication regimen: no Denies hypoglycemic symptoms (sweaty and shaky). Hypoglycemia prevention: not indicated at this time Current meal patterns: not discussed today Current exercise:  very limited On a statin: yes On aspirin 81 mg daily: yes Last microalbumin: 214 (04/07/21); on an ACEi/ARB: yes Current glucose readings 05/25/21-06/01/21: fasting blood glucose: 162-207. pre-prandial blood glucose: 104-224. post prandial glucose: 226-254. The following education was provided: Introduction to diabetes basic facts Medication: Identify diabetes medication and when best taken Monitoring: target blood sugar range, when to test Hypoglycemia: I have discussed with the patient how to treat hypoglycemia by the rule of 15; eat/drink 15g of sugar in the form of glucose tabs, 4 ounces of juice or soda and recheck fingerstick glucose in 15 minutes. Chocolate bars and ice cream should be avoided because fat delays carbohydrate digestion and absorption. Retreat if glucose remains low. Driving should cease until glucose is normal. Instructed patient to discuss insulin  requirements with endocrinology and make adjustments based off blood glucose readings and how much insulin she is currently taking.  Instructed to monitor blood sugars three times a day at the following times: 5-15 minutes before all meals, bedtime, and whenever patient experiences symptoms of hypo/hyperglycemia  Patient identified as a good candidate for a GLP-1 receptor agonist given reduction in cardiovascular disease, slowed chronic kidney disease progression, low risk of hypoglycemia, increased satiety, and weight loss. May be able to improve blood glucose and decrease insulin requirements with a low risk of hypoglycemia. Will discuss with endocrinology.  Hypertension: Blood pressure under good control. Blood pressure is at goal of <130/80 mmHg per 2017 AHA/ACC guidelines. Current medications: benazepril 20 mg by mouth once daily and amlodipine 5 mg by mouth once daily Intolerances: none Taking medications as directed: yes Side effects thought to be attributed to current medication regimen: no Current home blood pressure: not discussed today Continue benazepril 20 mg by mouth once daily and amlodipine 5 mg by mouth once daily Encourage dietary sodium restriction/DASH diet Recommend home blood pressure monitoring to discuss at next visit Discussed need for medication compliance  Hyperlipidemia (follows with Dr. Harl Bowie): Controlled. LDL at goal of <70 due to very high risk given diabetes + at least 1 additional major risk factor (hypertension and chronic kidney disease (CKD)) per 2020 AACE/ACE guidelines and TG at goal of <150 per 2020 AACE/ACE guidelines. Current medications: atorvastatin 40 mg by mouth once daily Intolerances: none Taking medications as directed: yes Side effects thought to be attributed to current medication regimen: no Continue atorvastatin 40 mg by mouth once daily Discussed need for medication compliance  Chronic Kidney Disease Stage 3b - GFR 30-44 (Moderate to  Severely Reduced Function) - follows with Dr. Theador Hawthorne: Appropriately managed Current medications: benazepril 20 mg by mouth once  daily and atorvastatin 40 mg by mouth once daily Intolerances: none Taking medications as directed: yes Side effects thought to be attributed to current medication regimen: no Most recent GFR: 34 mL/min Most recent microalbumin: 214 (04/07/21) Continue benazepril 20 mg by mouth once daily and atorvastatin 40 mg by mouth once daily Recommend adequate hydration  Discussed need for improved control of blood sugar  Patient Goals/Self-Care Activities Over the next 90 days, patient will:  Focus on medication adherence by keeping up with prescription refills and either using a pill box or reminders to take your medications at the prescribed times Check blood sugar three times a day at the following times: 5-15 minutes before all meals, bedtime, and whenever patient experiences symptoms of hypo/hyperglycemia, document, and provide at future appointments Check blood pressure at least once daily, document, and provide at future appointments  Follow Up Plan: Telephone follow up appointment with care management team member scheduled for: 06/14/21      Medication Assistance: None required.  Patient affirms current coverage meets needs.  Patient's preferred pharmacy is:  [facility], [place] [address] [place] [address] Phone: [phone] Fax: [phone]  [facility] ([facility] - [place], [place] [address] Phone: [phone] Fax: [phone] - [place], [place] [address] Phone: [phone] Fax: [phone]  Patient uses adherence packaging from [facility]  Follow Up:  Patient agrees to Care Plan and Follow-up.  Plan: Telephone follow up appointment with care  management team member scheduled for:  06/14/21  Kennon Holter, PharmD Clinical Pharmacist [facility] [phone]

## 2021-06-01 NOTE — Patient Instructions (Signed)
Laura Atkins,  It was great to talk to you today!  Please call me with any questions or concerns.   Visit Information   PATIENT GOALS:   Goals Addressed             This Visit's Progress    Medication Management       Patient Goals/Self-Care Activities Over the next 90 days, patient will:  Focus on medication adherence by keeping up with prescription refills and either using a pill box or reminders to take your medications at the prescribed times Check blood sugar three times a day at the following times: 5-15 minutes before all meals, bedtime, and whenever patient experiences symptoms of hypo/hyperglycemia, document, and provide at future appointments Check blood pressure at least once daily, document, and provide at future appointments        Consent to CCM Services: Laura Atkins was given information about Chronic Care Management services including:  CCM service includes personalized support from designated clinical staff supervised by her physician, including individualized plan of care and coordination with other care providers 24/7 contact phone numbers for assistance for urgent and routine care needs. Service will only be billed when office clinical staff spend 20 minutes or more in a month to coordinate care. Only one practitioner may furnish and bill the service in a calendar month. The patient may stop CCM services at any time (effective at the end of the month) by phone call to the office staff. The patient will be responsible for cost sharing (co-pay) of up to 20% of the service fee (after annual deductible is met).  Patient agreed to services and verbal consent obtained.   The patient verbalized understanding of instructions, educational materials, and care plan provided today and declined offer to receive copy of patient instructions, educational materials, and care plan.   Telephone follow up appointment with care management team member scheduled  for:06/14/21  Kennon Holter, PharmD Clinical Pharmacist [facility] [phone]   CLINICAL CARE PLAN: Patient Care Plan: Medication Management     Problem Identified: HTN, T2DM, CKD, HLD   Priority: High  Onset Date: 06/01/2021     Long-Range Goal: Disease Progression Prevention   Start Date: 06/01/2021  Expected End Date: 08/30/2021  This Visit's Progress: On track  Priority: High  Note:   Current Barriers:  Unable to independently monitor therapeutic efficacy Unable to achieve control of diabetes  Pharmacist Clinical Goal(s):  Over the next 90 days, patient will Achieve adherence to monitoring guidelines and medication adherence to achieve therapeutic efficacy Achieve control of diabetes as evidenced by improved fasting blood sugar and improved A1c through collaboration with PharmD and provider.   Interventions: 1:1 collaboration with Fayrene Helper, MD regarding development and update of comprehensive plan of care as evidenced by provider attestation and co-signature Inter-disciplinary care team collaboration (see longitudinal plan of care) Comprehensive medication review performed; medication list updated in electronic medical record  Type 2 Diabetes (Follows with Ms Marinus Maw @ endocrinology : Uncontrolled; Most recent A1c 10.0%. Given patient's other comorbidities, A1c goal <8% may be reasonable Current medications: Novolin 70/30 35 units subcutaneously before breakfast and 15 units subcutaneously before dinner No metformin given renal insuffiencey. Avoid SGLT2 inhibitors given recurrent UTIs Intolerances: none Taking medications as directed: no, patient's daughter reports that the patient is only taking 20 units of Novolin 70/30 subcutaneously once daily because she feels like her mother is being given too much insulin. She reports that the patient feels bad when her  blood glucose drops to ~115 and she does not want tight blood glucose control. Side  effects thought to be attributed to current medication regimen: no Denies hypoglycemic symptoms (sweaty and shaky). Hypoglycemia prevention: not indicated at this time Current meal patterns: not discussed today Current exercise:  very limited On a statin: yes On aspirin 81 mg daily: yes Last microalbumin: 214 (04/07/21); on an ACEi/ARB: yes Current glucose readings 05/25/21-06/01/21: fasting blood glucose: 162-207. pre-prandial blood glucose: 104-224. post prandial glucose: 226-254. The following education was provided: Introduction to diabetes basic facts Medication: Identify diabetes medication and when best taken Monitoring: target blood sugar range, when to test Hypoglycemia: I have discussed with the patient how to treat hypoglycemia by the rule of 15; eat/drink 15g of sugar in the form of glucose tabs, 4 ounces of juice or soda and recheck fingerstick glucose in 15 minutes. Chocolate bars and ice cream should be avoided because fat delays carbohydrate digestion and absorption. Retreat if glucose remains low. Driving should cease until glucose is normal. Instructed patient to discuss insulin requirements with endocrinology and make adjustments based off blood glucose readings and how much insulin she is currently taking.  Instructed to monitor blood sugars three times a day at the following times: 5-15 minutes before all meals, bedtime, and whenever patient experiences symptoms of hypo/hyperglycemia  Patient identified as a good candidate for a GLP-1 receptor agonist given reduction in cardiovascular disease, slowed chronic kidney disease progression, low risk of hypoglycemia, increased satiety, and weight loss. May be able to improve blood glucose and decrease insulin requirements with a low risk of hypoglycemia. Will discuss with endocrinology.  Hypertension: Blood pressure under good control. Blood pressure is at goal of <130/80 mmHg per 2017 AHA/ACC guidelines. Current medications: benazepril 20  mg by mouth once daily and amlodipine 5 mg by mouth once daily Intolerances: none Taking medications as directed: yes Side effects thought to be attributed to current medication regimen: no Current home blood pressure: not discussed today Continue benazepril 20 mg by mouth once daily and amlodipine 5 mg by mouth once daily Encourage dietary sodium restriction/DASH diet Recommend home blood pressure monitoring to discuss at next visit Discussed need for medication compliance  Hyperlipidemia (follows with Dr. Harl Bowie): Controlled. LDL at goal of <70 due to very high risk given diabetes + at least 1 additional major risk factor (hypertension and chronic kidney disease (CKD)) per 2020 AACE/ACE guidelines and TG at goal of <150 per 2020 AACE/ACE guidelines. Current medications: atorvastatin 40 mg by mouth once daily Intolerances: none Taking medications as directed: yes Side effects thought to be attributed to current medication regimen: no Continue atorvastatin 40 mg by mouth once daily Discussed need for medication compliance  Chronic Kidney Disease Stage 3b - GFR 30-44 (Moderate to Severely Reduced Function) - follows with Dr. Theador Hawthorne: Appropriately managed Current medications: benazepril 20 mg by mouth once daily and atorvastatin 40 mg by mouth once daily Intolerances: none Taking medications as directed: yes Side effects thought to be attributed to current medication regimen: no Most recent GFR: 34 mL/min Most recent microalbumin: 214 (04/07/21) Continue benazepril 20 mg by mouth once daily and atorvastatin 40 mg by mouth once daily Recommend adequate hydration  Discussed need for improved control of blood sugar  Patient Goals/Self-Care Activities Over the next 90 days, patient will:  Focus on medication adherence by keeping up with prescription refills and either using a pill box or reminders to take your medications at the prescribed times Check blood sugar three  times a day at the  following times: 5-15 minutes before all meals, bedtime, and whenever patient experiences symptoms of hypo/hyperglycemia, document, and provide at future appointments Check blood pressure at least once daily, document, and provide at future appointments  Follow Up Plan: Telephone follow up appointment with care management team member scheduled for: 06/14/21

## 2021-06-01 NOTE — Therapy (Signed)
[facility] [address] [place], [place], [address] Phone: [phone]   Fax:  [phone]  Occupational Therapy Treatment  Patient Details  Name: Laura Atkins MRN: [id number] Date of Birth: 09/09/1941 Referring Provider (OT): Tula Nakayama, MD   Encounter Date: 06/01/2021   OT End of Session - 06/01/21 1158     Visit Number 8    Number of Visits 12    Date for OT Re-Evaluation 06/12/21    Authorization Type [employer]    Authorization Time Period 12 visits approved (05/01/21-06/12/21)    Authorization - Visit Number 8    Authorization - Number of Visits 12    Progress Note Due on Visit 10    OT Start Time [phone]   pt arrived late   OT Stop Time 1030    OT Time Calculation (min) 37 min    Activity Tolerance Patient tolerated treatment well    Behavior During Therapy [facility] for tasks assessed/performed             Past Medical History:  Diagnosis Date   Alpha thalassemia trait 01/26/2010   02/2012: Nl CBC ex H&H-10.7/34.8, MCV-69    Anemia    Anxiety    Anxiety and depression    Cellulitis 05/09/2017   Depression    Diabetes mellitus    Foot pain, right 04/30/2013   GERD (gastroesophageal reflux disease)    Headache(784.0)    Hyperlipidemia    Hypertension    Iron deficiency 01/21/2017   Microcytic anemia 01/26/2010   02/2012: Nl CBC ex H&H-10.7/34.8, MCV-69    NECK PAIN, CHRONIC 10/21/2008   +chronic back pain     Obesity    Obstructive sleep apnea    Osteoarthritis    Left knee; right shoulder; chronic neck and back pain   Pruritus    PVD (peripheral vascular disease) ([place]) 01/28/2014   Seizures (HCC)    Shoulder pain, right 04/14/2015   Tremor    This started months ago after her seizure progressing to very poor hand writing   Urinary incontinence    UTI (urinary tract infection) 01/18/2013    Past Surgical History:  Procedure Laterality Date   ABDOMINAL HYSTERECTOMY     BREAST EXCISIONAL BIOPSY     Left; cyst    CATARACT EXTRACTION Right    12/2017   CATARACT EXTRACTION W/ INTRAOCULAR LENS IMPLANT Left 09/07/2013   CHOLECYSTECTOMY     COLONOSCOPY     COLONOSCOPY N/A 07/20/2015   Procedure: COLONOSCOPY;  Surgeon: Rogene Houston, MD;  Location: [facility];  Service: Endoscopy;  Laterality: N/A;  930   EYE SURGERY Left 09/07/2013   cataract    There were no vitals filed for this visit.   Subjective Assessment - 06/01/21 0957     Subjective  S: Yesterday was good, it felt good last night.    Patient is accompanied by: Family member   husband   Currently in Pain? No/denies                [facility] OT Assessment - 06/01/21 0952       Assessment   Medical Diagnosis OA of left shoulder      Precautions   Precautions Fall    Precaution Comments Uses RW for mobility.                      OT Treatments/Exercises (OP) - 06/01/21 ID:[id number]       Exercises  Exercises Shoulder      Shoulder Exercises: Seated   Row Theraband;10 reps    Theraband Level (Shoulder Row) Level 2 (Red)    Protraction AROM;10 reps    Horizontal ABduction AROM;10 reps    External Rotation AROM;10 reps    Internal Rotation AROM;10 reps    Flexion AROM;10 reps;Limitations    Flexion Limitations shoulder height    Abduction AROM;10 reps;Limitations    ABduction Limitations one arm only. to shoulder level.      Functional Reaching Activities   Mid Level Pt placing clothespins along vertical bar of pinch tree. Pt able to place 26 clothespins along the bar.                      OT Short Term Goals - 05/03/21 1111       OT SHORT TERM GOAL #1   Title Pt will be educated and verbalize independence with HEP in order to faciliate progress in therapy and increase her ability to use her LUE for 50% of self care tasks.    Time 3    Period Weeks    Status On-going    Target Date 05/22/21      OT SHORT TERM GOAL #2   Title Patient will increase her LUE P/ROM to [facility] in order to increase  her ability to complete upper body dressing and bathing tasks.    Time 3    Period Weeks    Status On-going      OT SHORT TERM GOAL #3   Title Patient will increase her LUE strength to 3/10 or better in order to pick up lightweight items and stabilize in both hands without difficulty.    Time 3    Period Weeks    Status On-going      OT SHORT TERM GOAL #4   Title Pt will report a pain level of 6/10 or less in her LUE while completing bathing and dressing tasks.    Time 3    Period Weeks    Status On-going               OT Long Term Goals - 05/03/21 1111       OT LONG TERM GOAL #1   Title Patient will utilize her LUE as her non dominant extremity for 75% or more of daily tasks.    Time 6    Period Weeks    Status On-going      OT LONG TERM GOAL #2   Title Pt will increase her LUE strength to 4-/5 in order to complete basic meal prep and lifting of household items of moderate weight with less difficulty.    Time 6    Period Weeks    Status On-going      OT LONG TERM GOAL #3   Title Pt will increase her LUE A/ROM to [facility] in order to complete shoulder level reaching with less difficulty.    Time 6    Period Weeks    Status On-going      OT LONG TERM GOAL #4   Title Patient will report a decrease in LUE pain of approximately 4/10 or less when completing bathing and dressing tasks.    Time 6    Period Weeks    Status On-going                   Plan - 06/01/21 1158     Clinical Impression Statement A: Pt  arrived late to session, no myofascial release to supine exercises completed. Pt complains of SOB at beginning of session, SpO2 at 99, instructed pt in PLB to calm and control breathing with good results. Pt completed seated A/ROM exercises, ROM limited to approximately 50%. Added functional reaching task with pinch tree, scapular theraband row. Verbal cuing for form and technique, rest breaks provided as needed.    Body Structure / Function / Physical Skills  ADL;UE functional use;Fascial restriction;Pain;ROM;Strength    Plan P: Progress note. Update HEP to seated A/ROM. Complete A/ROM using chair with back support to help with posture. Functional reaching task    OT Home Exercise Plan eval: table slides 8/16: AA/ROM shoulder exercises. 8/22: A/ROM seated shoulder exercises. AA/ROM abduction and horizontal abduction    Consulted and Agree with Plan of Care Patient             Patient will benefit from skilled therapeutic intervention in order to improve the following deficits and impairments:   Body Structure / Function / Physical Skills: ADL, UE functional use, Fascial restriction, Pain, ROM, Strength       Visit Diagnosis: Chronic left shoulder pain  Stiffness of left shoulder, not elsewhere classified  Other symptoms and signs involving the musculoskeletal system    Problem List Patient Active Problem List   Diagnosis Date Noted   Recurrent UTI 05/21/2021   Acute cystitis with hematuria 04/16/2021   Pressure injury of right heel, unstageable ([place]) 01/06/2021   Polyneuropathy associated with underlying disease ([place]) 01/06/2021   Chronic kidney disease, stage 4 (severe) ([place]) 01/06/2021   Dementia ([place]) 07/13/2020   Cognitive deficits    Carotid bruit 05/26/2020   Hyperlipidemia associated with type 2 diabetes mellitus ([place]) 12/08/2019   Left knee pain 02/11/2017   Iron deficiency 01/21/2017   Vitamin D deficiency 10/15/2016   Urinary incontinence 10/15/2016   Limited mobility 05/27/2016   Type 2 diabetes mellitus with hyperglycemia ([place]) 02/04/2016   Sleep terror disorder 12/14/2015   Fecal incontinence 12/13/2015   Back pain of lumbar region with sciatica 04/15/2015   Spondylosis, cervical, with myelopathy 04/14/2015   OSA (obstructive sleep apnea) 04/10/2013   Peripheral neuropathy 03/12/2013   Hearing loss 11/06/2012   Seizure-like activity ([place]) 01/25/2012   UNSTEADY GAIT 11/07/2010   Alpha thalassemia ([place])  01/26/2010   Morbid obesity ([place]) 04/21/2008   Anxiety and depression 04/21/2008   Essential hypertension 04/21/2008   GERD 04/21/2008   Osteoarthritis of left knee 04/21/2008    Guadelupe Sabin, OTR/L  [phone] 06/01/2021, 12:00 PM  [facility] [place], [place], [address] Phone: [phone]   Fax:  [phone]  Name: Laura Atkins MRN: [id number] Date of Birth: [birth year]

## 2021-06-06 ENCOUNTER — Encounter (HOSPITAL_COMMUNITY): Payer: Self-pay

## 2021-06-06 ENCOUNTER — Other Ambulatory Visit: Payer: Self-pay

## 2021-06-06 ENCOUNTER — Ambulatory Visit (HOSPITAL_COMMUNITY): Payer: Medicare HMO

## 2021-06-07 ENCOUNTER — Ambulatory Visit (INDEPENDENT_AMBULATORY_CARE_PROVIDER_SITE_OTHER): Payer: Medicare HMO | Admitting: Cardiology

## 2021-06-07 ENCOUNTER — Encounter: Payer: Self-pay | Admitting: Cardiology

## 2021-06-07 VITALS — BP 116/58 | HR 70 | Ht 68.0 in | Wt 256.2 lb

## 2021-06-07 DIAGNOSIS — E782 Mixed hyperlipidemia: Secondary | ICD-10-CM

## 2021-06-07 DIAGNOSIS — I10 Essential (primary) hypertension: Secondary | ICD-10-CM

## 2021-06-07 DIAGNOSIS — R600 Localized edema: Secondary | ICD-10-CM | POA: Diagnosis not present

## 2021-06-07 NOTE — Patient Instructions (Addendum)
Medication Instructions:  Continue all current medications.   Labwork: none  Testing/Procedures: none  Follow-Up: 6 months   Any Other Special Instructions Will Be Listed Below (If Applicable).   If you need a refill on your cardiac medications before your next appointment, please call your pharmacy.  

## 2021-06-07 NOTE — Progress Notes (Signed)
Clinical Summary Ms. Keeny is a 80 y.o.female seen today for follow up of the following medical problems.    1. Leg swelling     - chronic leg swelling that overall has been controlled Alternating torsemide 10m and 661m   2. Leg pain - cramping pain in back of legs with exertion. No rest pain, no sores on feet - ABIs 12/2014 were normal     3. HTN - remains compliant with meds   4. HL - compliant with statin.  Last panel Jan 2018 TC 162 TG 90 HDL 68 LDL 76   05/2020 TC 108 TG 74 HDL 49 LDL 44 03/2021 TC 14 TG 111 HDL 58 LDL 66   5. Alpha thalasemia - followed by hematology   6. CKD III - Cr has trended up to 1.7, followed by Dr BhTheador Hawthorne   7. Anemia - per pcp   Past Medical History:  Diagnosis Date   Alpha thalassemia trait 01/26/2010   02/2012: Nl CBC ex H&H-10.7/34.8, MCV-69    Anemia    Anxiety    Anxiety and depression    Cellulitis 05/09/2017   Depression    Diabetes mellitus    Foot pain, right 04/30/2013   GERD (gastroesophageal reflux disease)    Headache(784.0)    Hyperlipidemia    Hypertension    Iron deficiency 01/21/2017   Microcytic anemia 01/26/2010   02/2012: Nl CBC ex H&H-10.7/34.8, MCV-69    NECK PAIN, CHRONIC 10/21/2008   +chronic back pain     Obesity    Obstructive sleep apnea    Osteoarthritis    Left knee; right shoulder; chronic neck and back pain   Pruritus    PVD (peripheral vascular disease) ([facility]/03/2014   Seizures (HCC)    Shoulder pain, right 04/14/2015   Tremor    This started months ago after her seizure progressing to very poor hand writing   Urinary incontinence    UTI (urinary tract infection) 01/18/2013     Allergies  Allergen Reactions   Penicillins Shortness Of Breath, Itching and Rash   Prednisone Shortness Of Breath, Itching and Rash   Propoxyphene N-Acetaminophen Itching and Nausea And Vomiting   Sulfa Antibiotics Itching and Nausea And Vomiting     Current Outpatient Medications  Medication Sig  Dispense Refill   Accu-Chek FastClix Lancets MISC 1 each by Does not apply route 4 (four) times daily. Use to monitor glucose levels 4 times daily; E11.65 306 each 2   ACCU-CHEK GUIDE test strip USE TO TEST BLOOD SUGAR FOUR TIMES DAILY 200 strip 1   Alcohol Swabs (B-D SINGLE USE SWABS REGULAR) PADS 1 each by Does not apply route 4 (four) times daily. Use to prep site for glucose monitoring 4 times daily; E11.65 300 each 2   amLODipine (NORVASC) 5 MG tablet TAKE 1 TABLET BY MOUTH EVERY DAY 30 tablet 3   aspirin 81 MG tablet Take 81 mg by mouth daily.     atorvastatin (LIPITOR) 40 MG tablet TAKE 1 TABLET BY MOUTH EVERY DAY 30 tablet 2   benazepril (LOTENSIN) 20 MG tablet TAKE 1 TABLET BY MOUTH EVERY DAY 30 tablet 5   Blood Glucose Monitoring Suppl (ACCU-CHEK GUIDE ME) w/Device KIT 1 each by Does not apply route 4 (four) times daily. Use to monitor glucose levels 4 times daily; E11.65 1 kit 0   buPROPion (WELLBUTRIN XL) 150 MG 24 hr tablet TAKE 1 TABLET BY MOUTH EVERY MORNING 30 tablet  2   Cholecalciferol 125 MCG (5000 UT) TABS Take 1 tablet by mouth daily.     Continuous Blood Gluc Sensor (FREESTYLE LIBRE 14 DAY SENSOR) MISC 1 Device by Other route every 14 (fourteen) days. (Patient not taking: No sig reported) 6 each 3   diphenhydrAMINE (BENADRYL) 25 MG tablet Take 25 mg by mouth at bedtime.     donepezil (ARICEPT) 5 MG tablet TAKE 1 TABLET BY MOUTH AT BEDTIME 30 tablet 2   ferrous sulfate 325 (65 FE) MG EC tablet Take 1 tablet by mouth 2 (two) times daily with a meal.     gabapentin (NEURONTIN) 400 MG capsule TAKE ONE CAPSULE BY MOUTH EVERY MORNING and TAKE ONE CAPSULE AT NOON and TAKE TWO CAPSULES AT BEDTIME 360 capsule 1   insulin NPH-regular Human (NOVOLIN 70/30 RELION) (70-30) 100 UNIT/ML injection 35 units with breakfast, and 15 units with supper. 20 mL 11   Insulin Syringe-Needle U-100 (INSULIN SYRINGE 1CC/31GX5/16") 31G X 5/16" 1 ML MISC 1 each by Does not apply route daily. Use to inject  insulin daily; E11.65 90 each 2   Multiple Vitamin (MULTIVITAMIN) capsule Take 1 capsule by mouth daily. (Patient not taking: Reported on 06/01/2021)     omeprazole (PRILOSEC) 20 MG capsule TAKE ONE CAPSULE BY MOUTH EVERY DAY 30 capsule 2   potassium chloride SA (KLOR-CON) 20 MEQ tablet TAKE 1 TABLET BY MOUTH TWICE DAILY 60 tablet 1   sertraline (ZOLOFT) 100 MG tablet Take 1 tablet (100 mg total) by mouth daily. 30 tablet 2   tiZANidine (ZANAFLEX) 2 MG tablet Take 1 tablet (2 mg total) by mouth every 6 (six) hours as needed for muscle spasms. 20 tablet 0   torsemide (DEMADEX) 20 MG tablet TAKE 40 MG EVERY OTHER DAY ALTERNATING 60 MG EVERY OTHER DAY 135 tablet 6   UNABLE TO FIND Walker x 1  DX unsteady gait, osteoarthritis of left knee 1 each 0   UNABLE TO FIND Elevated [place] Seat x 1 DX: unsteady gait, back pain, osteoarthritis of left knee 1 each 0   UNABLE TO FIND Standing upright walker x 1  DX M54.40, M19.90 1 each 0   UNABLE TO FIND Incontinence pads and supplies 1 each 1   UNABLE TO FIND Rollator Walker 1 Product 0   UNABLE TO FIND XL Tranquility Briefs for incontinence. 1 Package 3   No current facility-administered medications for this visit.     Past Surgical History:  Procedure Laterality Date   ABDOMINAL HYSTERECTOMY     BREAST EXCISIONAL BIOPSY     Left; cyst   CATARACT EXTRACTION Right    12/2017   CATARACT EXTRACTION W/ INTRAOCULAR LENS IMPLANT Left 09/07/2013   CHOLECYSTECTOMY     COLONOSCOPY     COLONOSCOPY N/A 07/20/2015   Procedure: COLONOSCOPY;  Surgeon: Rogene Houston, MD;  [facility];  Service: Endoscopy;  Laterality: N/A;  930   EYE SURGERY Left 09/07/2013   cataract     Allergies  Allergen Reactions   Penicillins Shortness Of Breath, Itching and Rash   Prednisone Shortness Of Breath, Itching and Rash   Propoxyphene N-Acetaminophen Itching and Nausea And Vomiting   Sulfa Antibiotics Itching and Nausea And Vomiting      Family History   Problem Relation Age of Onset   Lung cancer Mother 68   Kidney disease Father    Diabetes Sister    Keloids Brother    ADD / ADHD Grandchild    Bipolar disorder  Grandchild    Bipolar disorder Daughter    Seizures Daughter    Heart disease Daughter    Kidney disease Son    Neuropathy Son    Kidney disease Son    Edema Daughter    Breast cancer Daughter 13   Allergies Daughter    Alcohol abuse Neg Hx    Drug abuse Neg Hx      Social History Ms. Derryberry reports that she has never smoked. She has never used smokeless tobacco. Ms. Alcott reports no history of alcohol use.   Review of Systems CONSTITUTIONAL: No weight loss, fever, chills, weakness or fatigue.  HEENT: Eyes: No visual loss, blurred vision, double vision or yellow sclerae.No hearing loss, sneezing, congestion, runny nose or sore throat.  SKIN: No rash or itching.  CARDIOVASCULAR: per hpi RESPIRATORY: No shortness of breath, cough or sputum.  GASTROINTESTINAL: No anorexia, nausea, vomiting or diarrhea. No abdominal pain or blood.  GENITOURINARY: No burning on urination, no polyuria NEUROLOGICAL: No headache, dizziness, syncope, paralysis, ataxia, numbness or tingling in the extremities. No change in bowel or bladder control.  MUSCULOSKELETAL: No muscle, back pain, joint pain or stiffness.  LYMPHATICS: No enlarged nodes. No history of splenectomy.  PSYCHIATRIC: No history of depression or anxiety.  ENDOCRINOLOGIC: No reports of sweating, cold or heat intolerance. No polyuria or polydipsia.  Marland Kitchen   Physical Examination Today's Vitals   06/07/21 1141  BP: (!) 116/58  Pulse: 70  SpO2: 98%  Weight: 256 lb 3.2 oz (116.2 kg)  Height: 5' 8"  (1.727 m)   Body mass index is 38.96 kg/m.  Gen: resting comfortably, no acute distress HEENT: no scleral icterus, pupils equal round and reactive, no palptable cervical adenopathy,  CV: RRR, no mr/g, no jvd Resp: Clear to auscultation bilaterally GI: abdomen is soft,  non-tender, non-distended, normal bowel sounds, no hepatosplenomegaly MSK: extremities are warm, no edema.  Skin: warm, no rash Neuro:  no focal deficits Psych: appropriate affect   Diagnostic Studies  01/2013 echo Study Conclusions  - Left ventricle: The cavity size was normal. There was mild   concentric hypertrophy. Systolic function was normal. The   estimated ejection fraction was in the range of 60% to   65%. Wall motion was normal; there were no regional wall   motion abnormalities. - Aortic valve: Mildly calcified annulus. - Right ventricle: The cavity size was normal. Wall   thickness was mildly increased. - Atrial septum: No defect or patent foramen ovale was   identified.   01/2013 MPI IMPRESSION: Probably negative pharmacologic stress nuclear myocardial study revealing normal left ventricular size, normal left ventricular systolic function and no significant stress-induced EKG abnormalities.  On scintigraphic imaging, there was a small and mild defect that likely represents variable breast attenuation; however, the possibility of modest anterior ischemia cannot be unequivocally excluded.   01/2013 Event monitor No significant arrhythmias   12/2014 ABIs: R 1.10 L 1.15   02/2015 Carotid US 1-39% bilateral stenosis     Assessment and Plan   1. Chronic LE edema - overall controlled, continue diuretic     2. HTN - bp is at goal, continue current meds   3. Hyperlipidemia - at goal, continue statin  EKG today shows SR isolated PAC  F/u 6 months     Arnoldo Lenis, M.D.

## 2021-06-08 ENCOUNTER — Other Ambulatory Visit: Payer: Self-pay

## 2021-06-08 ENCOUNTER — Ambulatory Visit (HOSPITAL_COMMUNITY): Payer: Medicare HMO | Admitting: Occupational Therapy

## 2021-06-08 ENCOUNTER — Encounter (HOSPITAL_COMMUNITY): Payer: Self-pay | Admitting: Occupational Therapy

## 2021-06-08 DIAGNOSIS — M25512 Pain in left shoulder: Secondary | ICD-10-CM | POA: Diagnosis not present

## 2021-06-08 DIAGNOSIS — R29898 Other symptoms and signs involving the musculoskeletal system: Secondary | ICD-10-CM

## 2021-06-08 DIAGNOSIS — G8929 Other chronic pain: Secondary | ICD-10-CM | POA: Diagnosis not present

## 2021-06-08 DIAGNOSIS — M25612 Stiffness of left shoulder, not elsewhere classified: Secondary | ICD-10-CM

## 2021-06-08 NOTE — Therapy (Signed)
[place], [place], [address] Phone: [phone]   Fax:  [phone]  Occupational Therapy Reassessment, Treatment, Discharge Summary  Patient Details  Name: Laura Atkins MRN: [id number] Date of Birth: 1940/11/11 Referring Provider (OT): Tula Nakayama, MD   Encounter Date: 06/08/2021   OT End of Session - 06/08/21 2049     Visit Number 9    Number of Visits 12    Date for OT Re-Evaluation 06/12/21    Authorization Type [employer]    Authorization Time Period 12 visits approved (05/01/21-06/12/21)    Authorization - Visit Number 9    Authorization - Number of Visits 12    Progress Note Due on Visit 10    OT Start Time 1451   pt arrived late   OT Stop Time 1514    OT Time Calculation (min) 23 min    Activity Tolerance Patient tolerated treatment well    Behavior During Therapy [facility] for tasks assessed/performed             Past Medical History:  Diagnosis Date   Alpha thalassemia trait 01/26/2010   02/2012: Nl CBC ex H&H-10.7/34.8, MCV-69    Anemia    Anxiety    Anxiety and depression    Cellulitis 05/09/2017   Depression    Diabetes mellitus    Foot pain, right 04/30/2013   GERD (gastroesophageal reflux disease)    Headache(784.0)    Hyperlipidemia    Hypertension    Iron deficiency 01/21/2017   Microcytic anemia 01/26/2010   02/2012: Nl CBC ex H&H-10.7/34.8, MCV-69    NECK PAIN, CHRONIC 10/21/2008   +chronic back pain     Obesity    Obstructive sleep apnea    Osteoarthritis    Left knee; right shoulder; chronic neck and back pain   Pruritus    PVD (peripheral vascular disease) ([place]) 01/28/2014   Seizures (HCC)    Shoulder pain, right 04/14/2015   Tremor    This started months ago after her seizure progressing to very poor hand writing   Urinary incontinence    UTI (urinary tract infection) 01/18/2013    Past Surgical History:  Procedure Laterality Date   ABDOMINAL HYSTERECTOMY     BREAST  EXCISIONAL BIOPSY     Left; cyst   CATARACT EXTRACTION Right    12/2017   CATARACT EXTRACTION W/ INTRAOCULAR LENS IMPLANT Left 09/07/2013   CHOLECYSTECTOMY     COLONOSCOPY     COLONOSCOPY N/A 07/20/2015   Procedure: COLONOSCOPY;  Surgeon: Rogene Houston, MD;  [facility];  Service: Endoscopy;  Laterality: N/A;  930   EYE SURGERY Left 09/07/2013   cataract    There were no vitals filed for this visit.   Subjective Assessment - 06/08/21 1454     Subjective  S: I'm using it as much as possible during the day.    Currently in Pain? No/denies                [facility] OT Assessment - 06/08/21 1450       Assessment   Medical Diagnosis OA of left shoulder      Precautions   Precautions Fall    Precaution Comments Uses RW for mobility.      Palpation   Palpation comment min-mod fascial restrictions in the left upper arm, upper trapezius, and scapularis region.   max previous     AROM   Overall AROM Comments Assessed seated. IR/er  adducted    AROM Assessment Site Shoulder    Right/Left Shoulder Left    Left Shoulder Flexion 102 Degrees   72 previous   Left Shoulder ABduction 100 Degrees   40 previous   Left Shoulder Internal Rotation 90 Degrees   same as previous   Left Shoulder External Rotation 59 Degrees   0 previous     Strength   Overall Strength Comments Assessed seated. IR/er adducted    Strength Assessment Site Shoulder    Right/Left Shoulder Left    Left Shoulder Flexion 4/5   3-/5 previous   Left Shoulder ABduction 4-/5   3-/5 previous   Left Shoulder Internal Rotation 5/5   3/5 previous   Left Shoulder External Rotation 4/5   3-/5 previous                     OT Treatments/Exercises (OP) - 06/08/21 1454       Exercises   Exercises Shoulder      Shoulder Exercises: Seated   Protraction AROM;5 reps    Horizontal ABduction AROM;5 reps    External Rotation AROM;5 reps    Internal Rotation AROM;5 reps    Flexion AROM;5 reps     Abduction AROM;5 reps                    OT Education - 06/08/21 1504     Education Details A/ROM    Person(s) Educated Patient;Spouse    Methods Explanation;Demonstration    Comprehension Verbalized understanding;Returned demonstration              OT Short Term Goals - 06/08/21 1459       OT SHORT TERM GOAL #1   Title Pt will be educated and verbalize independence with HEP in order to faciliate progress in therapy and increase her ability to use her LUE for 50% of self care tasks.    Time 3    Period Weeks    Status Achieved    Target Date 05/22/21      OT SHORT TERM GOAL #2   Title Patient will increase her LUE P/ROM to [facility] in order to increase her ability to complete upper body dressing and bathing tasks.    Time 3    Period Weeks    Status Achieved      OT SHORT TERM GOAL #3   Title Patient will increase her LUE strength to 3/5 or better in order to pick up lightweight items and stabilize in both hands without difficulty.    Time 3    Period Weeks    Status Achieved      OT SHORT TERM GOAL #4   Title Pt will report a pain level of 6/10 or less in her LUE while completing bathing and dressing tasks.    Time 3    Period Weeks    Status Achieved               OT Long Term Goals - 06/08/21 1501       OT LONG TERM GOAL #1   Title Patient will utilize her LUE as her non dominant extremity for 75% or more of daily tasks.    Time 6    Period Weeks    Status Achieved      OT LONG TERM GOAL #2   Title Pt will increase her LUE strength to 4-/5 in order to complete basic meal prep and lifting of household items of  moderate weight with less difficulty.    Time 6    Period Weeks    Status Achieved      OT LONG TERM GOAL #3   Title Pt will increase her LUE A/ROM to [facility] in order to complete shoulder level reaching with less difficulty.    Time 6    Period Weeks    Status Partially Met      OT LONG TERM GOAL #4   Title Patient will report a  decrease in LUE pain of approximately 4/10 or less when completing bathing and dressing tasks.    Time 6    Period Weeks    Status Achieved                   Plan - 06/08/21 1509     Clinical Impression Statement A: Reassessment completed this session, pt has met all STGs and 3/4 LTGs with remaining goal partially met. Pt reports she is using her LUE as non-dominant assist with all ADLs and she is able to reach for items with greater ease now. On observation LUE ROM is now equivalent to RUE and is at baseline for the pt. Pt reports she has pain management strategies that she is using as needed and her pain rarely exceeds 4/10 now. Pt is agreeable to discharge today with HEP to maintain and improve upon current functioning.    Body Structure / Function / Physical Skills ADL;UE functional use;Fascial restriction;Pain;ROM;Strength    Plan P: Discharge pt    OT Home Exercise Plan eval: table slides 8/16: AA/ROM shoulder exercises. 8/22: A/ROM seated shoulder exercises. AA/ROM abduction and horizontal abduction; 9/15: shoulder A/ROM    Consulted and Agree with Plan of Care Patient;Family member/caregiver    Family Member Consulted husband             Patient will benefit from skilled therapeutic intervention in order to improve the following deficits and impairments:   Body Structure / Function / Physical Skills: ADL, UE functional use, Fascial restriction, Pain, ROM, Strength       Visit Diagnosis: Chronic left shoulder pain  Stiffness of left shoulder, not elsewhere classified  Other symptoms and signs involving the musculoskeletal system    Problem List Patient Active Problem List   Diagnosis Date Noted   Recurrent UTI 05/21/2021   Acute cystitis with hematuria 04/16/2021   Pressure injury of right heel, unstageable ([place]) 01/06/2021   Polyneuropathy associated with underlying disease ([place]) 01/06/2021   Chronic kidney disease, stage 4 (severe) ([place]) 01/06/2021    Dementia ([place]) 07/13/2020   Cognitive deficits    Carotid bruit 05/26/2020   Hyperlipidemia associated with type 2 diabetes mellitus ([place]) 12/08/2019   Left knee pain 02/11/2017   Iron deficiency 01/21/2017   Vitamin D deficiency 10/15/2016   Urinary incontinence 10/15/2016   Limited mobility 05/27/2016   Type 2 diabetes mellitus with hyperglycemia ([place]) 02/04/2016   Sleep terror disorder 12/14/2015   Fecal incontinence 12/13/2015   Back pain of lumbar region with sciatica 04/15/2015   Spondylosis, cervical, with myelopathy 04/14/2015   OSA (obstructive sleep apnea) 04/10/2013   Peripheral neuropathy 03/12/2013   Hearing loss 11/06/2012   Seizure-like activity ([place]) 01/25/2012   UNSTEADY GAIT 11/07/2010   Alpha thalassemia ([place]) 01/26/2010   Morbid obesity ([place]) 04/21/2008   Anxiety and depression 04/21/2008   Essential hypertension 04/21/2008   GERD 04/21/2008   Osteoarthritis of left knee 04/21/2008    Guadelupe Sabin, OTR/L  [phone] 06/08/2021, 8:52 PM  [place], [place], [address] Phone: [phone]   Fax:  [phone]  Name: JOLETTE LANA MRN: [id number] Date of Birth: 05/26/1941   OCCUPATIONAL THERAPY DISCHARGE SUMMARY  Visits from Start of Care: 9  Current functional level related to goals / functional outcomes: See above. Pt has met all goals with exception of one partially met goal. Pt reports returning to baseline functioning with LUE as non-dominant.    Remaining deficits: Occasional pain in LUE, generalized BUE weakness   Education / Equipment: HEP for A/ROM   Patient agrees to discharge. Patient goals were met. Patient is being discharged due to meeting the stated rehab goals.Marland Kitchen

## 2021-06-08 NOTE — Patient Instructions (Signed)

## 2021-06-13 ENCOUNTER — Ambulatory Visit: Payer: Medicare HMO | Admitting: Nurse Practitioner

## 2021-06-14 ENCOUNTER — Other Ambulatory Visit: Payer: Self-pay | Admitting: Family Medicine

## 2021-06-14 ENCOUNTER — Ambulatory Visit: Payer: Medicare HMO

## 2021-06-14 ENCOUNTER — Other Ambulatory Visit (HOSPITAL_COMMUNITY): Payer: Self-pay | Admitting: Psychiatry

## 2021-06-14 ENCOUNTER — Ambulatory Visit: Payer: Medicare HMO | Admitting: Pharmacist

## 2021-06-14 ENCOUNTER — Encounter (HOSPITAL_COMMUNITY): Payer: Medicare HMO

## 2021-06-14 DIAGNOSIS — I10 Essential (primary) hypertension: Secondary | ICD-10-CM

## 2021-06-14 DIAGNOSIS — N184 Chronic kidney disease, stage 4 (severe): Secondary | ICD-10-CM

## 2021-06-14 DIAGNOSIS — E1169 Type 2 diabetes mellitus with other specified complication: Secondary | ICD-10-CM

## 2021-06-14 DIAGNOSIS — E1165 Type 2 diabetes mellitus with hyperglycemia: Secondary | ICD-10-CM

## 2021-06-14 DIAGNOSIS — E785 Hyperlipidemia, unspecified: Secondary | ICD-10-CM

## 2021-06-14 DIAGNOSIS — Z794 Long term (current) use of insulin: Secondary | ICD-10-CM

## 2021-06-14 NOTE — Chronic Care Management (AMB) (Signed)
Chronic Care Management Pharmacy Note  06/14/2021 Name:  Laura Atkins MRN:  [id number] DOB:  [birth year]  Summary:  Patient's daughter reports that the patient is only taking 20 units of Novolin 70/30 subcutaneously once daily because she feels like her mother is being given too much insulin. She reports that the patient feels bad when her blood glucose drops to ~115 and she does not want tight blood glucose control. Current glucose readings over last week per verbal report from blood glucose meter: fasting blood glucose: 135-194, post prandial glucose: 180-243. Instructed patient to discuss insulin requirements with endocrinology and make adjustments based off blood glucose readings and how much insulin she is currently taking.  Since patient is only taking insulin once daily and is concerned with hypoglycemia risk, consider switching to longer acting once daily insulin such as [place] or Toujeo. Both of these are covered under the Part D [employer], which her insurance Personnel officer) calls the Insulin Savings Program. The Insulin Savings Program provides stable $35 or less Select Insulins copays through the Coverage Gap. Eligible members will pay no more than $35 for up to a 30-day supply at all preferred and standard cost-sharing pharmacies. See the plan's Prescription Drug Guide for a list of all Select Insulins. Patient also identified as a good candidate for a GLP-1 receptor agonist given reduction in cardiovascular disease, slowed chronic kidney disease progression, low risk of hypoglycemia, increased satiety, and weight loss. May be able to improve blood glucose and decrease insulin requirements with a low risk of hypoglycemia. Will discuss with endocrinology.  Subjective: Laura Atkins is an 80 y.o. year old female who is a primary patient of Moshe Cipro, Norwood Levo, MD.  The CCM team was consulted for assistance with disease management and care coordination needs.    Engaged with  patient by telephone for follow up visit in response to provider referral for pharmacy case management and/or care coordination services.   Consent to Services:  The patient was given information about Chronic Care Management services, agreed to services, and gave verbal consent prior to initiation of services.  Please see initial visit note for detailed documentation.   Patient Care Team: Fayrene Helper, MD as PCP - General Branch, Alphonse Guild, MD as PCP - Cardiology (Cardiology) Marcial Pacas, MD as Consulting Physician (Neurology) Renato Shin, MD as Consulting Physician (Endocrinology) Cloria Spring, MD as Consulting Physician ([place]) Alonza Smoker, LCSW as Social Worker (Psychiatry) Clent Jacks, MD as Consulting Physician (Ophthalmology) Beryle Lathe, [facility] (Pharmacist)  Objective:  Lab Results  Component Value Date   CREATININE 1.55 (H) 04/07/2021   CREATININE 1.13 (H) 06/03/2020   CREATININE 1.17 (H) 06/02/2020    Lab Results  Component Value Date   HGBA1C 10.0 (H) 04/07/2021   Last diabetic Eye exam:  Lab Results  Component Value Date/Time   HMDIABEYEEXA Retinopathy (A) 02/15/2021 12:00 AM    Last diabetic Foot exam: No results found for: HMDIABFOOTEX      Component Value Date/Time   CHOL 144 04/07/2021 1030   TRIG 111 04/07/2021 1030   HDL 58 04/07/2021 1030   CHOLHDL 2.6 12/01/2019 1137   VLDL 16 05/14/2017 1039   LDLCALC 66 04/07/2021 1030   LDLCALC 72 12/01/2019 1137    Hepatic Function Latest Ref Rng & Units 04/07/2021 06/03/2020 05/28/2020  Total Protein 6.0 - 8.5 g/dL 7.3 7.1 7.0  Albumin 3.7 - 4.7 g/dL 4.4 3.6 3.6  AST 0 - 40 IU/L 14  35 24  ALT 0 - 32 IU/L 16 38 23  Alk Phosphatase 44 - 121 IU/L 137(H) 76 78  Total Bilirubin 0.0 - 1.2 mg/dL 0.2 0.5 0.4  Bilirubin, Direct 0.0 - 0.3 mg/dL - - -    Lab Results  Component Value Date/Time   TSH 1.129 05/27/2020 06:28 PM   TSH 2.24 12/01/2019 11:37 AM   TSH 1.12 11/26/2018  09:45 AM    CBC Latest Ref Rng & Units 04/07/2021 06/03/2020 06/02/2020  WBC 3.4 - 10.8 x10E3/uL 6.1 6.3 6.6  Hemoglobin 11.1 - 15.9 g/dL 8.5(L) 9.6(L) 10.2(L)  Hematocrit 34.0 - 46.6 % 28.3(L) 33.7(L) 35.0(L)  Platelets 150 - 450 x10E3/uL 225 170 168    Lab Results  Component Value Date/Time   VD25OH 38 12/01/2019 11:37 AM   VD25OH 15 (L) 11/26/2018 09:45 AM    Clinical ASCVD: No  The 10-year ASCVD risk score (Arnett DK, et al., 2019) is: 25.3%   Values used to calculate the score:     Age: 70 years     Sex: Female     Is Non-Hispanic African American: Yes     Diabetic: Yes     Tobacco smoker: No     Systolic Blood Pressure: 433 mmHg     Is BP treated: Yes     HDL Cholesterol: 58 mg/dL     Total Cholesterol: 144 mg/dL    Social History   Tobacco Use  Smoking Status Never  Smokeless Tobacco Never   BP Readings from Last 3 Encounters:  06/07/21 (!) 116/58  05/30/21 132/73  05/18/21 136/63   Pulse Readings from Last 3 Encounters:  06/07/21 70  05/30/21 89  05/18/21 80   Wt Readings from Last 3 Encounters:  06/07/21 256 lb 3.2 oz (116.2 kg)  05/30/21 256 lb (116.1 kg)  05/18/21 260 lb (117.9 kg)    Assessment: Review of patient past medical history, allergies, medications, health status, including review of consultants reports, laboratory and other test data, was performed as part of comprehensive evaluation and provision of chronic care management services.   SDOH:  (Social Determinants of Health) assessments and interventions performed:    CCM Care Plan  Allergies  Allergen Reactions   Penicillins Shortness Of Breath, Itching and Rash   Prednisone Shortness Of Breath, Itching and Rash   Propoxyphene N-Acetaminophen Itching and Nausea And Vomiting   Sulfa Antibiotics Itching and Nausea And Vomiting    Medications Reviewed Today     Reviewed by Beryle Lathe, [facility] (Pharmacist) on 06/14/21 at 1610  Med List Status: <None>   Medication Order  Taking? Sig Documenting Provider Last Dose Status Informant  Accu-Chek FastClix Lancets MISC [id number]  1 each by Does not apply route 4 (four) times daily. Use to monitor glucose levels 4 times daily; E11.65 Renato Shin, MD  Active Family Member  ACCU-CHEK GUIDE test strip [id number]  USE TO TEST BLOOD SUGAR FOUR TIMES DAILY Renato Shin, MD  Active   Alcohol Swabs (B-D SINGLE USE SWABS REGULAR) PADS [id number]  1 each by Does not apply route 4 (four) times daily. Use to prep site for glucose monitoring 4 times daily; E11.65 Renato Shin, MD  Active Family Member  amLODipine (NORVASC) 5 MG tablet [id number] Yes TAKE 1 TABLET BY MOUTH EVERY DAY Fayrene Helper, MD Taking Active   aspirin 81 MG tablet [id number] Yes Take 81 mg by mouth daily. [provider] Taking Active Family Member  atorvastatin (LIPITOR) 40 MG tablet [id number] Yes  TAKE 1 TABLET BY MOUTH EVERY DAY Fayrene Helper, MD Taking Active   benazepril (LOTENSIN) 20 MG tablet [id number] Yes TAKE 1 TABLET BY MOUTH EVERY DAY Fayrene Helper, MD Taking Active   Blood Glucose Monitoring Suppl (ACCU-CHEK GUIDE ME) w/Device KIT [id number]  1 each by Does not apply route 4 (four) times daily. Use to monitor glucose levels 4 times daily; E11.65 Renato Shin, MD  Active Family Member  buPROPion [facility] XL) 150 MG 24 hr tablet [id number] Yes TAKE 1 TABLET BY MOUTH EVERY MORNING Cloria Spring, MD Taking Active   Cholecalciferol 125 MCG (5000 UT) TABS [id number] Yes Take 1 tablet by mouth daily. [provider] Taking Active Family Member  Continuous Blood Gluc Sensor (FREESTYLE LIBRE 14 DAY SENSOR) [place] [id number]  1 Device by Other route every 14 (fourteen) days. Renato Shin, MD  Active   diphenhydrAMINE (BENADRYL) 25 MG tablet [id number] Yes Take 25 mg by mouth at bedtime. [provider] Taking Active Family Member  donepezil (ARICEPT) 5 MG tablet [id number] Yes TAKE 1 TABLET BY MOUTH AT BEDTIME Fayrene Helper, MD Taking Active   ferrous sulfate 325 (65 FE) MG EC tablet [id number] Yes Take 1 tablet by mouth 2 (two) times daily with a meal. [provider] Taking Active   gabapentin (NEURONTIN) 400 MG capsule [id number] Yes TAKE ONE CAPSULE BY MOUTH EVERY MORNING and TAKE ONE CAPSULE AT NOON and TAKE TWO CAPSULES AT BEDTIME Fayrene Helper, MD Taking Active   insulin NPH-regular Human (NOVOLIN 70/30 RELION) (70-30) 100 UNIT/ML injection [id number] Yes 35 units with breakfast, and 15 units with supper. Renato Shin, MD Taking Active            Med Note Waldo Laine, [place] Jun 01, 2021  4:48 PM) Patient's daughter reports that the patient is only taking 20 units subcutaneously once daily because she feels like her mother is being given too much insulin. She reports that the patient feels bad when her blood glucose drops to ~115 and she does not want tight blood glucose control.  Insulin Syringe-Needle U-100 (INSULIN SYRINGE 1CC/31GX5/16") 31G X 5/16" 1 ML MISC [id number]  1 each by Does not apply route daily. Use to inject insulin daily; E11.65 Renato Shin, MD  Active Family Member  Multiple Vitamin (MULTIVITAMIN) capsule [id number] Yes Take 1 capsule by mouth daily. [provider] Taking Active   omeprazole (PRILOSEC) 20 MG capsule [id number] Yes TAKE ONE CAPSULE BY MOUTH EVERY DAY Fayrene Helper, MD Taking Active   potassium chloride SA (KLOR-CON) 20 MEQ tablet [id number] Yes TAKE 1 TABLET BY MOUTH TWICE DAILY Fayrene Helper, MD Taking Active   sertraline (ZOLOFT) 100 MG tablet [id number] Yes TAKE 1 TABLET BY MOUTH EVERY DAY Cloria Spring, MD Taking Active   tiZANidine (ZANAFLEX) 2 MG tablet [id number] Yes Take 1 tablet (2 mg total) by mouth every 6 (six) hours as needed for muscle spasms. Scot Jun, FNP Taking Active   torsemide (DEMADEX) 20 MG tablet [id number] Yes TAKE 40 MG EVERY OTHER DAY ALTERNATING 60 MG EVERY OTHER DAY Arnoldo Lenis, MD  Taking Active   UNABLE TO FIND [id number]  Walker x 1  DX unsteady gait, osteoarthritis of left knee Fayrene Helper, MD  Active   UNABLE TO FIND [id number]  Elevated [place] Seat x 1 DX: unsteady gait, back pain, osteoarthritis of left knee Fayrene Helper, MD  Active   UNABLE TO FIND [id number]  Standing  upright walker x 1  DX M54.40, M19.90 Fayrene Helper, MD  Active   UNABLE TO FIND [id number]  Incontinence pads and supplies Noreene Larsson, NP  Active   UNABLE TO FIND [id number]  Anon Raices, [place], MD  Active   UNABLE TO FIND [id number]  XL Tranquility Briefs for incontinence. Fayrene Helper, MD  Active   Med List Note Perlie Mayo, NP 02/06/19 1038):                Patient Active Problem List   Diagnosis Date Noted   Recurrent UTI 05/21/2021   Acute cystitis with hematuria 04/16/2021   Pressure injury of right heel, unstageable ([place]) 01/06/2021   Polyneuropathy associated with underlying disease ([place]) 01/06/2021   Chronic kidney disease, stage 4 (severe) ([place]) 01/06/2021   Dementia ([place]) 07/13/2020   Cognitive deficits    Carotid bruit 05/26/2020   Hyperlipidemia associated with type 2 diabetes mellitus ([place]) 12/08/2019   Left knee pain 02/11/2017   Iron deficiency 01/21/2017   Vitamin D deficiency 10/15/2016   Urinary incontinence 10/15/2016   Limited mobility 05/27/2016   Type 2 diabetes mellitus with hyperglycemia ([place]) 02/04/2016   Sleep terror disorder 12/14/2015   Fecal incontinence 12/13/2015   Back pain of lumbar region with sciatica 04/15/2015   Spondylosis, cervical, with myelopathy 04/14/2015   OSA (obstructive sleep apnea) 04/10/2013   Peripheral neuropathy 03/12/2013   Hearing loss 11/06/2012   Seizure-like activity ([place]) 01/25/2012   UNSTEADY GAIT 11/07/2010   Alpha thalassemia ([place]) 01/26/2010   Morbid obesity ([place]) 04/21/2008   Anxiety and depression 04/21/2008   Essential hypertension 04/21/2008   GERD 04/21/2008    Osteoarthritis of left knee 04/21/2008    Immunization History  Administered Date(s) Administered   Fluad Quad(high Dose 65+) 05/20/2019   H1N1 07/15/2008   Influenza Split 06/17/2012   Influenza Whole 07/03/2007, 06/17/2009, 06/07/2011   Influenza, High Dose Seasonal PF 07/17/2018   Influenza,inj,Quad PF,6+ Mos 07/13/2013, 09/06/2014, 08/10/2015, 05/23/2016, 05/22/2017   Pneumococcal Conjugate-13 05/03/2014   Pneumococcal Polysaccharide-23 09/08/2010   Td 09/08/2010   Tdap 07/17/2018   Unspecified SARS-COV-2 Vaccination 09/25/2019, 10/26/2019    Conditions to be addressed/monitored: HTN, HLD, DMII, and CKD Stage 3b  Care Plan : Medication Management  Updates made by Beryle Lathe, [place] since 06/14/2021 12:00 AM     Problem: HTN, T2DM, CKD, HLD   Priority: High  Onset Date: 06/01/2021     Long-Range Goal: Disease Progression Prevention   Start Date: 06/01/2021  Expected End Date: 08/30/2021  Recent Progress: On track  Priority: High  Note:   Current Barriers:  Unable to independently monitor therapeutic efficacy Unable to achieve control of diabetes  Pharmacist Clinical Goal(s):  Over the next 90 days, patient will Achieve adherence to monitoring guidelines and medication adherence to achieve therapeutic efficacy Achieve control of diabetes as evidenced by improved fasting blood sugar and improved A1c through collaboration with PharmD and provider.   Interventions: 1:1 collaboration with Fayrene Helper, MD regarding development and update of comprehensive plan of care as evidenced by provider attestation and co-signature Inter-disciplinary care team collaboration (see longitudinal plan of care) Comprehensive medication review performed; medication list updated in electronic medical record  Type 2 Diabetes (Follows with Ms Marinus Maw @ endocrinology): Uncontrolled; Most recent A1c 10.0%. Given patient's other comorbidities, A1c goal <8% may be  reasonable Current medications: Novolin 70/30 35 units subcutaneously before breakfast and 15 units subcutaneously before dinner No metformin given renal insuffiencey.  Avoid SGLT2 inhibitors given recurrent UTIs Intolerances: none Taking medications as directed: no, patient's daughter reports that the patient is only taking 20 units of Novolin 70/30 subcutaneously once daily because she feels like her mother is being given too much insulin. She reports that the patient feels bad when her blood glucose drops to ~115 and she does not want tight blood glucose control. Side effects thought to be attributed to current medication regimen: no Denies hypoglycemic symptoms (sweaty and shaky). Hypoglycemia prevention: not indicated at this time Current meal patterns: not discussed today Current exercise:  very limited On a statin: yes On aspirin 81 mg daily: yes Last microalbumin: 214 (04/07/21); on an ACEi/ARB: yes Current glucose readings over last week per verbal report from blood glucose meter: fasting blood glucose: 135-194. post prandial glucose: 180-243. The following education was provided: Introduction to diabetes basic facts Medication: Identify diabetes medication and when best taken Monitoring: target blood sugar range, when to test Hypoglycemia: I have discussed with the patient how to treat hypoglycemia by the rule of 15; eat/drink 15g of sugar in the form of glucose tabs, 4 ounces of juice or soda and recheck fingerstick glucose in 15 minutes. Chocolate bars and ice cream should be avoided because fat delays carbohydrate digestion and absorption. Retreat if glucose remains low. Driving should cease until glucose is normal. Instructed patient to discuss insulin requirements with endocrinology and make adjustments based off blood glucose readings and how much insulin she is currently taking.  Instructed to monitor blood sugars three times a day at the following times: 5-15 minutes before all meals,  bedtime, and whenever patient experiences symptoms of hypo/hyperglycemia  Patient identified as a good candidate for a GLP-1 receptor agonist given reduction in cardiovascular disease, slowed chronic kidney disease progression, low risk of hypoglycemia, increased satiety, and weight loss. May be able to improve blood glucose and decrease insulin requirements with a low risk of hypoglycemia. Will discuss with endocrinology. Since patient is only taking insulin once daily and is concerned with hypoglycemia risk, consider switching to longer acting once daily insulin such as [place] or [place]je[place]. Both of these are covered under the Part D [employer], which her insurance Personnel officer) calls the Insulin Savings Program. The Insulin Savings Program provides stable $35 or less Select Insulins copays through the Coverage Gap. Eligible members will pay no more than $35 for up to a 30-day supply at all preferred and standard cost-sharing pharmacies. See the plan's Prescription Drug Guide for a list of all Select Insulins.  Hypertension: Blood pressure under good control. Blood pressure is at goal of <130/80 mmHg per 2017 AHA/ACC guidelines. Current medications: benazepril 20 mg by mouth once daily and amlodipine 5 mg by mouth once daily Intolerances: none Taking medications as directed: yes Side effects thought to be attributed to current medication regimen: no Current home blood pressure: not discussed today Continue benazepril 20 mg by mouth once daily and amlodipine 5 mg by mouth once daily Encourage dietary sodium restriction/DASH diet Recommend home blood pressure monitoring to discuss at next visit Discussed need for medication compliance  Hyperlipidemia (follows with Dr. Harl Bowie): Controlled. LDL at goal of <70 due to very high risk given diabetes + at least 1 additional major risk factor (hypertension and chronic kidney disease (CKD)) per 2020 AACE/ACE guidelines and TG at goal of <150 per 2020  AACE/ACE guidelines. Current medications: atorvastatin 40 mg by mouth once daily Intolerances: none Taking medications as directed: yes Side effects thought to be attributed  to current medication regimen: no Continue atorvastatin 40 mg by mouth once daily Discussed need for medication compliance  Chronic Kidney Disease Stage 3b - GFR 30-44 (Moderate to Severely Reduced Function) - follows with Dr. Theador Hawthorne: Appropriately managed Current medications: benazepril 20 mg by mouth once daily and atorvastatin 40 mg by mouth once daily Intolerances: none Taking medications as directed: yes Side effects thought to be attributed to current medication regimen: no Most recent GFR: 34 mL/min Most recent microalbumin: 214 (04/07/21) Continue benazepril 20 mg by mouth once daily and atorvastatin 40 mg by mouth once daily Recommend adequate hydration  Discussed need for improved control of blood sugar  Patient Goals/Self-Care Activities Over the next 90 days, patient will:  Focus on medication adherence by keeping up with prescription refills and either using a pill box or reminders to take your medications at the prescribed times Check blood sugar three times a day at the following times: 5-15 minutes before all meals, bedtime, and whenever patient experiences symptoms of hypo/hyperglycemia, document, and provide at future appointments Check blood pressure at least once daily, document, and provide at future appointments  Follow Up Plan: Telephone follow up appointment with care management team member scheduled for: 07/04/21      Medication Assistance: None required.  Patient affirms current coverage meets needs.  Patient's preferred pharmacy is:  [facility], [place] [address] [place] [address] Phone: [phone] Fax: [phone]  [facility] - [place], [place] [address] Phone: [phone] Fax: [phone] - [place], [place] [address] Phone: [phone] Fax: [phone]  Patient uses adherence packaging   Follow Up:  Patient agrees to Care Plan and Follow-up.  Plan: Telephone follow up appointment with care management team member scheduled for:  07/04/21  Kennon Holter, PharmD Clinical Pharmacist [facility] [phone]

## 2021-06-14 NOTE — Patient Instructions (Signed)
[name],  It was great to talk to you today!  Please call me with any questions or concerns.   Visit Information  PATIENT GOALS:  Goals Addressed             This Visit's Progress    Medication Management       Patient Goals/Self-Care Activities Over the next 90 days, patient will:  Focus on medication adherence by keeping up with prescription refills and either using a pill box or reminders to take your medications at the prescribed times Check blood sugar three times a day at the following times: 5-15 minutes before all meals, bedtime, and whenever patient experiences symptoms of hypo/hyperglycemia, document, and provide at future appointments Check blood pressure at least once daily, document, and provide at future appointments          The patient verbalized understanding of instructions, educational materials, and care plan provided today and declined offer to receive copy of patient instructions, educational materials, and care plan.   Telephone follow up appointment with care management team member scheduled for:07/04/21  Kennon Holter, PharmD Clinical Pharmacist [facility] [phone]

## 2021-06-16 ENCOUNTER — Encounter: Payer: Self-pay | Admitting: Nurse Practitioner

## 2021-06-16 ENCOUNTER — Other Ambulatory Visit: Payer: Self-pay

## 2021-06-16 ENCOUNTER — Ambulatory Visit (INDEPENDENT_AMBULATORY_CARE_PROVIDER_SITE_OTHER): Payer: Medicare HMO | Admitting: Nurse Practitioner

## 2021-06-16 VITALS — BP 124/61 | HR 72 | Ht 68.0 in | Wt 257.0 lb

## 2021-06-16 DIAGNOSIS — R809 Proteinuria, unspecified: Secondary | ICD-10-CM | POA: Diagnosis not present

## 2021-06-16 DIAGNOSIS — E1122 Type 2 diabetes mellitus with diabetic chronic kidney disease: Secondary | ICD-10-CM | POA: Diagnosis not present

## 2021-06-16 DIAGNOSIS — I10 Essential (primary) hypertension: Secondary | ICD-10-CM

## 2021-06-16 DIAGNOSIS — E782 Mixed hyperlipidemia: Secondary | ICD-10-CM | POA: Diagnosis not present

## 2021-06-16 DIAGNOSIS — D508 Other iron deficiency anemias: Secondary | ICD-10-CM | POA: Diagnosis not present

## 2021-06-16 DIAGNOSIS — I129 Hypertensive chronic kidney disease with stage 1 through stage 4 chronic kidney disease, or unspecified chronic kidney disease: Secondary | ICD-10-CM | POA: Diagnosis not present

## 2021-06-16 DIAGNOSIS — N1832 Chronic kidney disease, stage 3b: Secondary | ICD-10-CM | POA: Diagnosis not present

## 2021-06-16 DIAGNOSIS — I739 Peripheral vascular disease, unspecified: Secondary | ICD-10-CM | POA: Diagnosis not present

## 2021-06-16 DIAGNOSIS — Z794 Long term (current) use of insulin: Secondary | ICD-10-CM

## 2021-06-16 DIAGNOSIS — N189 Chronic kidney disease, unspecified: Secondary | ICD-10-CM | POA: Diagnosis not present

## 2021-06-16 MED ORDER — PEN NEEDLES 31G X 5 MM MISC: 4 refills | Status: DC

## 2021-06-16 MED ORDER — TRESIBA FLEXTOUCH 200 UNIT/ML ~~LOC~~ SOPN: 14.0000 [IU] | PEN_INJECTOR | Freq: Every evening | SUBCUTANEOUS | 3 refills | Status: DC

## 2021-06-16 NOTE — Patient Instructions (Signed)

## 2021-06-16 NOTE — Progress Notes (Signed)
Endocrinology Follow Up Note       06/16/2021, 11:25 AM   Subjective:    Patient ID: Laura Atkins, female    DOB: 1941-09-22.  Laura Atkins is being seen in follow up after being seen in consultation for management of currently uncontrolled symptomatic diabetes requested by  Laura Helper, MD.   Past Medical History:  Diagnosis Date   Alpha thalassemia trait 01/26/2010   02/2012: Nl CBC ex H&H-10.7/34.8, MCV-69    Anemia    Anxiety    Anxiety and depression    Cellulitis 05/09/2017   Depression    Diabetes mellitus    Foot pain, right 04/30/2013   GERD (gastroesophageal reflux disease)    Headache(784.0)    Hyperlipidemia    Hypertension    Iron deficiency 01/21/2017   Microcytic anemia 01/26/2010   02/2012: Nl CBC ex H&H-10.7/34.8, MCV-69    NECK PAIN, CHRONIC 10/21/2008   +chronic back pain     Obesity    Obstructive sleep apnea    Osteoarthritis    Left knee; right shoulder; chronic neck and back pain   Pruritus    PVD (peripheral vascular disease) ([place]) 01/28/2014   Seizures (HCC)    Shoulder pain, right 04/14/2015   Tremor    This started months ago after her seizure progressing to very poor hand writing   Urinary incontinence    UTI (urinary tract infection) 01/18/2013    Past Surgical History:  Procedure Laterality Date   ABDOMINAL HYSTERECTOMY     BREAST EXCISIONAL BIOPSY     Left; cyst   CATARACT EXTRACTION Right    12/2017   CATARACT EXTRACTION W/ INTRAOCULAR LENS IMPLANT Left 09/07/2013   CHOLECYSTECTOMY     COLONOSCOPY     COLONOSCOPY N/A 07/20/2015   Procedure: COLONOSCOPY;  Surgeon: Rogene Houston, MD;  Location: [facility];  Service: Endoscopy;  Laterality: N/A;  930   EYE SURGERY Left 09/07/2013   cataract    Social History   Socioeconomic History   Marital status: Married    Spouse name: Laura Atkins   Number of children: 5   Years of education: 12   Highest  education level: Not on file  Occupational History   Occupation: Disabled     Employer: RETIRED  Tobacco Use   Smoking status: Never   Smokeless tobacco: Never  Vaping Use   Vaping Use: Never used  Substance and Sexual Activity   Alcohol use: No    Alcohol/week: 0.0 standard drinks   Drug use: No   Sexual activity: Yes    Birth control/protection: Surgical  Other Topics Concern   Not on file  Social History Narrative   07/27/20 Patient lives at home with her husband Laura Atkins) and dgtr- Laura Atkins.  Patient is retired.    Right handed.    Five Children.    Caffeine- 2 daily   Social Determinants of Health   Financial Resource Strain: Not on file  Food Insecurity: No Food Insecurity   Worried About Charity fundraiser in the Last Year: Never true   Ran Out of Food in the Last Year: Never true  Transportation Needs: No Transportation Needs  Lack of Transportation (Medical): No   Lack of Transportation (Non-Medical): No  Physical Activity: Inactive   Days of Exercise per Week: 0 days   Minutes of Exercise per Session: 0 min  Stress: No Stress Concern Present   Feeling of Stress : Not at all  Social Connections: Moderately Integrated   Frequency of Communication with Friends and Family: More than three times a week   Frequency of Social Gatherings with Friends and Family: More than three times a week   Attends Religious Services: More than 4 times per year   Active Member of [employer] or Organizations: No   Attends Archivist Meetings: Never   Marital Status: Married    Family History  Problem Relation Age of Onset   Lung cancer Mother 30   Kidney disease Father    Diabetes Sister    Keloids Brother    ADD / ADHD Grandchild    Bipolar disorder Grandchild    Bipolar disorder Daughter    Seizures Daughter    Heart disease Daughter    Kidney disease Son    Neuropathy Son    Kidney disease Son    Edema Daughter    Breast cancer Daughter 1   Allergies Daughter     Alcohol abuse Neg Hx    Drug abuse Neg Hx     Outpatient Encounter Medications as of 06/16/2021  Medication Sig   Accu-Chek FastClix Lancets MISC 1 each by Does not apply route 4 (four) times daily. Use to monitor glucose levels 4 times daily; E11.65   ACCU-CHEK GUIDE test strip USE TO TEST BLOOD SUGAR FOUR TIMES DAILY   Alcohol Swabs (B-D SINGLE USE SWABS REGULAR) PADS 1 each by Does not apply route 4 (four) times daily. Use to prep site for glucose monitoring 4 times daily; E11.65   amLODipine (NORVASC) 5 MG tablet TAKE 1 TABLET BY MOUTH EVERY DAY   aspirin 81 MG tablet Take 81 mg by mouth daily.   atorvastatin (LIPITOR) 40 MG tablet TAKE 1 TABLET BY MOUTH EVERY DAY   benazepril (LOTENSIN) 20 MG tablet TAKE 1 TABLET BY MOUTH EVERY DAY   Blood Glucose Monitoring Suppl (ACCU-CHEK GUIDE ME) w/Device KIT 1 each by Does not apply route 4 (four) times daily. Use to monitor glucose levels 4 times daily; E11.65   buPROPion (WELLBUTRIN XL) 150 MG 24 hr tablet TAKE 1 TABLET BY MOUTH EVERY MORNING   Cholecalciferol 125 MCG (5000 UT) TABS Take 1 tablet by mouth daily.   Continuous Blood Gluc Sensor (FREESTYLE LIBRE 14 DAY SENSOR) MISC 1 Device by Other route every 14 (fourteen) days.   diphenhydrAMINE (BENADRYL) 25 MG tablet Take 25 mg by mouth at bedtime.   donepezil (ARICEPT) 5 MG tablet TAKE 1 TABLET BY MOUTH AT BEDTIME   ferrous sulfate 325 (65 FE) MG EC tablet Take 1 tablet by mouth 2 (two) times daily with a meal.   gabapentin (NEURONTIN) 400 MG capsule TAKE ONE CAPSULE BY MOUTH EVERY MORNING and TAKE ONE CAPSULE AT NOON and TAKE TWO CAPSULES AT BEDTIME   insulin degludec (TRESIBA FLEXTOUCH) 200 UNIT/ML FlexTouch Pen Inject 14 Units into the skin at bedtime.   Insulin Pen Needle (PEN NEEDLES) 31G X 5 MM MISC Use to inject insulin once daily   Insulin Syringe-Needle U-100 (INSULIN SYRINGE 1CC/31GX5/16") 31G X 5/16" 1 ML MISC 1 each by Does not apply route daily. Use to inject insulin daily;  E11.65   Multiple Vitamin (MULTIVITAMIN) capsule Take 1 capsule  by mouth daily.   omeprazole (PRILOSEC) 20 MG capsule TAKE ONE CAPSULE BY MOUTH EVERY DAY   potassium chloride SA (KLOR-CON) 20 MEQ tablet TAKE 1 TABLET BY MOUTH TWICE DAILY   sertraline (ZOLOFT) 100 MG tablet TAKE 1 TABLET BY MOUTH EVERY DAY   tiZANidine (ZANAFLEX) 2 MG tablet Take 1 tablet (2 mg total) by mouth every 6 (six) hours as needed for muscle spasms.   torsemide (DEMADEX) 20 MG tablet TAKE 40 MG EVERY OTHER DAY ALTERNATING 60 MG EVERY OTHER DAY   UNABLE TO FIND Walker x 1  DX unsteady gait, osteoarthritis of left knee   UNABLE TO FIND Elevated Toliet Seat x 1 DX: unsteady gait, back pain, osteoarthritis of left knee   UNABLE TO FIND Standing upright walker x 1  DX M54.40, M19.90   UNABLE TO FIND Incontinence pads and supplies   UNABLE TO FIND Rollator Walker   UNABLE TO FIND XL Tranquility Briefs for incontinence.   [DISCONTINUED] insulin NPH-regular Human (NOVOLIN 70/30 RELION) (70-30) 100 UNIT/ML injection 35 units with breakfast, and 15 units with supper.   No facility-administered encounter medications on file as of 06/16/2021.    ALLERGIES: Allergies  Allergen Reactions   Penicillins Shortness Of Breath, Itching and Rash   Prednisone Shortness Of Breath, Itching and Rash   Propoxyphene N-Acetaminophen Itching and Nausea And Vomiting   Sulfa Antibiotics Itching and Nausea And Vomiting    VACCINATION STATUS: Immunization History  Administered Date(s) Administered   Fluad Quad(high Dose 65+) 05/20/2019   H1N1 07/15/2008   Influenza Split 06/17/2012   Influenza Whole 07/03/2007, 06/17/2009, 06/07/2011   Influenza, High Dose Seasonal PF 07/17/2018   Influenza,inj,Quad PF,6+ Mos 07/13/2013, 09/06/2014, 08/10/2015, 05/23/2016, 05/22/2017   Pneumococcal Conjugate-13 05/03/2014   Pneumococcal Polysaccharide-23 09/08/2010   Td 09/08/2010   Tdap 07/17/2018   Unspecified SARS-COV-2 Vaccination 09/25/2019,  10/26/2019    Diabetes She presents for her follow-up diabetic visit. She has type 2 diabetes mellitus. Onset time: poor historian- was diagnosed in her early 34s. Her disease course has been fluctuating. There are no hypoglycemic associated symptoms. Associated symptoms include blurred vision, fatigue, polydipsia and polyuria. There are no hypoglycemic complications. Symptoms are stable. Diabetic complications include a CVA, nephropathy, peripheral neuropathy and PVD. [facility] daughter reports CVA in past) Risk factors for coronary artery disease include diabetes mellitus, dyslipidemia, family history, hypertension, obesity, post-menopausal and sedentary lifestyle. Current diabetic treatment includes insulin injections. She is compliant with treatment some of the time. Her weight is fluctuating minimally. She is following a generally unhealthy diet. When asked about meal planning, she reported none. She has not had a previous visit with a dietitian. She rarely participates in exercise. Her home blood glucose trend is fluctuating minimally. Her breakfast blood glucose range is generally >200 mg/dl. Her lunch blood glucose range is generally >200 mg/dl. Her dinner blood glucose range is generally >200 mg/dl. Her overall blood glucose range is >200 mg/dl. (She presents today, accompanied by her daughter, with her reading showing above target fasting and postprandial glycemic profile.  She was not due for another A1c today.  Her PCP pharmacist reached out to me in the interim to let me know that the premixed insulin was not being taken as prescribed.  The daughter informs me that due to her unpredictable glucose drops, they have been cautious about the amount of insulin she is taking.  Family is unable to get outside assistance in the home and cannot be with her 24/7 to assist.) An ACE  inhibitor/angiotensin II receptor blocker is being taken. She sees a podiatrist.Eye exam is current.  Hypertension This is a  chronic problem. The current episode started more than 1 year ago. The problem has been resolved since onset. The problem is controlled. Associated symptoms include blurred vision. There are no associated agents to hypertension. Risk factors for coronary artery disease include diabetes mellitus, dyslipidemia, family history, obesity, post-menopausal state and sedentary lifestyle. Past treatments include calcium channel blockers and ACE inhibitors. The current treatment provides moderate improvement. Compliance problems include diet and exercise.  Hypertensive end-organ damage includes kidney disease, CVA and PVD. Identifiable causes of hypertension include chronic renal disease and sleep apnea.  Hyperlipidemia This is a chronic problem. The current episode started more than 1 year ago. The problem is controlled. Recent lipid tests were reviewed and are normal. Exacerbating diseases include chronic renal disease, diabetes and obesity. There are no known factors aggravating her hyperlipidemia. Current antihyperlipidemic treatment includes statins. The current treatment provides moderate improvement of lipids. Compliance problems include adherence to diet and adherence to exercise.  Risk factors for coronary artery disease include diabetes mellitus, dyslipidemia, family history, hypertension, obesity, a sedentary lifestyle and post-menopausal.    Review of systems  Constitutional: + Minimally fluctuating body weight, current Body mass index is 39.08 kg/m., + fatigue, no subjective hyperthermia, no subjective hypothermia Eyes: no blurry vision, no xerophthalmia ENT: no sore throat, no nodules palpated in throat, no dysphagia/odynophagia, no hoarseness Cardiovascular: no chest pain, no shortness of breath, no palpitations, + intermittent leg swelling Respiratory: no cough, no shortness of breath Gastrointestinal: no nausea/vomiting/diarrhea Genitourinary: + polyuria and incontinence Musculoskeletal: Hx  chronic pain; walks with walker Skin: no rashes, no hyperemia Neurological: no tremors, no numbness, no tingling, no dizziness Psychiatric: no depression, no anxiety  Objective:     BP 124/61   Pulse 72   Ht 5' 8"  (1.727 m)   Wt 257 lb (116.6 kg)   BMI 39.08 kg/m   Wt Readings from Last 3 Encounters:  06/16/21 257 lb (116.6 kg)  06/07/21 256 lb 3.2 oz (116.2 kg)  05/30/21 256 lb (116.1 kg)     BP Readings from Last 3 Encounters:  06/16/21 124/61  06/07/21 (!) 116/58  05/30/21 132/73     Physical Exam- Limited  Constitutional:  Body mass index is 39.08 kg/m. , not in acute distress, normal state of mind Eyes:  EOMI, no exophthalmos Neck: Supple Cardiovascular: irregular rhythm, no murmurs, rubs, or gallops, + nonpitting edema to BLE Respiratory: Adequate breathing efforts, no crackles, rales, rhonchi, or wheezing Musculoskeletal: no gross deformities, strength intact in all four extremities, no gross restriction of joint movements, walks with walker Skin:  no rashes, no hyperemia Neurological: no tremor with outstretched hands    CMP ( most recent) CMP     Component Value Date/Time   NA 140 04/07/2021 1030   K 4.8 04/07/2021 1030   CL 102 04/07/2021 1030   CO2 23 04/07/2021 1030   GLUCOSE 195 (H) 04/07/2021 1030   GLUCOSE 145 (H) 06/03/2020 0210   BUN 36 (H) 04/07/2021 1030   CREATININE 1.55 (H) 04/07/2021 1030   CREATININE 1.30 (H) 12/01/2019 1137   CALCIUM 9.9 04/07/2021 1030   PROT 7.3 04/07/2021 1030   ALBUMIN 4.4 04/07/2021 1030   AST 14 04/07/2021 1030   ALT 16 04/07/2021 1030   ALKPHOS 137 (H) 04/07/2021 1030   BILITOT 0.2 04/07/2021 1030   GFRNONAA 47 (L) 06/03/2020 0210   GFRNONAA 40 (L) 12/01/2019  [place] (L) 06/03/2020 0210   GFRAA 46 (L) 12/01/2019 1137     Diabetic Labs (most recent): Lab Results  Component Value Date   HGBA1C 10.0 (H) 04/07/2021   HGBA1C 9.5 (A) 11/17/2020   HGBA1C 8.3 (A) 06/30/2020     Lipid Panel (  most recent) Lipid Panel     Component Value Date/Time   CHOL 144 04/07/2021 1030   TRIG 111 04/07/2021 1030   HDL 58 04/07/2021 1030   CHOLHDL 2.6 12/01/2019 1137   VLDL 16 05/14/2017 1039   LDLCALC 66 04/07/2021 1030   LDLCALC 72 12/01/2019 1137   LABVLDL 20 04/07/2021 1030      Lab Results  Component Value Date   TSH 1.129 05/27/2020   TSH 2.24 12/01/2019   TSH 1.12 11/26/2018   TSH 2.01 05/14/2017   TSH 2.13 02/21/2016   TSH 0.989 08/18/2014   TSH 0.520 01/23/2012   TSH 1.315 09/08/2010   TSH 1.674 05/16/2010   TSH 1.184 03/10/2009           Assessment & Plan:   1) Uncontrolled Type 2 Diabetes with CKD stage 3b-  She presents today, accompanied by her daughter, with her reading showing above target fasting and postprandial glycemic profile.  She was not due for another A1c today.  Her PCP pharmacist reached out to me in the interim to let me know that the premixed insulin was not being taken as prescribed.  The daughter informs me that due to her unpredictable glucose drops, they have been cautious about the amount of insulin she is taking.  Family is unable to get outside assistance in the home and cannot be with her 24/7 to assist.  - STEPHENI CAMERON has currently uncontrolled symptomatic type 2 DM since 80 years of age, with most recent A1c of 10 %.   -Recent labs reviewed.  She has seen Dr. Theador Hawthorne in the past for CKD.  - I had a long discussion with her about the progressive nature of diabetes and the pathology behind its complications.  -her diabetes is complicated by CKD, CVA, neuropathy, PVD and she remains at a high risk for more acute and chronic complications which include CAD, CVA, CKD, retinopathy, and neuropathy. These are all discussed in detail with her.  - Nutritional counseling repeated at each appointment due to patients tendency to fall back in to old habits.  - The patient admits there is a room for improvement in their diet and drink  choices. -  Suggestion is made for the patient to avoid simple carbohydrates from their diet including Cakes, Sweet Desserts / Pastries, Ice Cream, Soda (diet and regular), Sweet Tea, Candies, Chips, Cookies, Sweet Pastries, Store Bought Juices, Alcohol in Excess of 1-2 drinks a day, Artificial Sweeteners, Coffee Creamer, and "Sugar-free" Products. This will help patient to have stable blood glucose profile and potentially avoid unintended weight gain.   - I encouraged the patient to switch to unprocessed or minimally processed complex starch and increased protein intake (animal or plant source), fruits, and vegetables.   - Patient is advised to stick to a routine mealtimes to eat 3 meals a day and avoid unnecessary snacks (to snack only to correct hypoglycemia).  - I have approached her with the following individualized plan to manage her diabetes and patient agrees:   -Given her age and comorbidities, avoiding hypoglycemia will be the number 1 priority in her diabetes management.  An A1c of 7-7.5 will be acceptable  for her.  Adjustments need to be made slowly in her case to avoid inadvertent hypoglycemia.  -Will switch her to ultra-long acting Tresiba at low dose of 14 units SQ nightly.    -she is encouraged to continue monitoring blood glucose twice daily, before breakfast and before bed, and to call the clinic if she has readings less than 80 or above 300 for 3 tests in a row.  She could benefit from CGM device such as Dexcom given her tendency to have random drops in glucose and lack of 24/7 supervision in the home.  Will send in order to [facility].  - she is warned not to take insulin without proper monitoring per orders. - Adjustment parameters are given to her for hypo and hyperglycemia in writing.  - she is not a candidate for Metformin or SGLT2i due to concurrent renal insufficiency.  - she will be considered for incretin therapy as appropriate next visit.  - Specific targets for  A1c;  LDL, HDL, and Triglycerides were discussed with the patient.  2) Blood Pressure /Hypertension:  her blood pressure is controlled to target.   she is advised to continue her current medications including Norvasc 5 mg p.o. daily with breakfast, Benazepril 20 mg po daily and Torsemide 40 mg and 60 mg daily on alternating days.  3) Lipids/Hyperlipidemia:    Review of her recent lipid panel from 04/07/21 showed controlled LDL at 66 .  she is advised to continue Lipitor 40 mg daily at bedtime.  Side effects and precautions discussed with her.  4)  Weight/Diet:  her Body mass index is 39.08 kg/m.  -  clearly complicating her diabetes care.   she is a candidate for weight loss. I discussed with her the fact that loss of 5 - 10% of her  current body weight will have the most impact on her diabetes management.  Exercise, and detailed carbohydrates information provided  -  detailed on discharge instructions.  5) Chronic Care/Health Maintenance: -she is on ACEI/ARB and Statin medications and is encouraged to initiate and continue to follow up with Ophthalmology, Dentist, Podiatrist at least yearly or according to recommendations, and advised to stay away from smoking. I have recommended yearly flu vaccine and pneumonia vaccine at least every 5 years; moderate intensity exercise for up to 150 minutes weekly; and sleep for at least 7 hours a day.  - she is advised to maintain close follow up with Laura Helper, MD for primary care needs, as well as her other providers for optimal and coordinated care.     I spent 44 minutes in the care of the patient today including review of labs from [place], Lipids, Thyroid Function, Hematology (current and previous including abstractions from other facilities); face-to-face time discussing  her blood glucose readings/logs, discussing hypoglycemia and hyperglycemia episodes and symptoms, medications doses, her options of short and long term treatment based on the latest  standards of care / guidelines;  discussion about incorporating lifestyle medicine;  and documenting the encounter.    Please refer to Patient Instructions for Blood Glucose Monitoring and Insulin/Medications Dosing Guide"  in media tab for additional information. Please  also refer to " Patient Self Inventory" in the Media  tab for reviewed elements of pertinent patient history.  [name] participated in the discussions, expressed understanding, and voiced agreement with the above plans.  All questions were answered to her satisfaction. she is encouraged to contact clinic should she have any questions or concerns prior to  her return visit.     Follow up plan: - Return in about 1 month (around 07/16/2021) for Diabetes F/U, Bring meter and logs, No previsit labs.    Rayetta Pigg, [facility] [address] [place], [place] [address] Phone: [phone] Fax: [phone]  06/16/2021, 11:25 AM

## 2021-06-21 ENCOUNTER — Telehealth: Payer: Self-pay | Admitting: Nurse Practitioner

## 2021-06-21 NOTE — Telephone Encounter (Signed)
Had previously sent in order for CGM to [facility].  Given her once daily injection of insulin, she is not a candidate for CGM per their communication with [place].

## 2021-06-23 DIAGNOSIS — E785 Hyperlipidemia, unspecified: Secondary | ICD-10-CM | POA: Diagnosis not present

## 2021-06-23 DIAGNOSIS — N184 Chronic kidney disease, stage 4 (severe): Secondary | ICD-10-CM | POA: Diagnosis not present

## 2021-06-23 DIAGNOSIS — I10 Essential (primary) hypertension: Secondary | ICD-10-CM | POA: Diagnosis not present

## 2021-06-23 DIAGNOSIS — Z794 Long term (current) use of insulin: Secondary | ICD-10-CM | POA: Diagnosis not present

## 2021-06-23 DIAGNOSIS — E1165 Type 2 diabetes mellitus with hyperglycemia: Secondary | ICD-10-CM | POA: Diagnosis not present

## 2021-06-23 DIAGNOSIS — E1169 Type 2 diabetes mellitus with other specified complication: Secondary | ICD-10-CM

## 2021-06-26 ENCOUNTER — Telehealth: Payer: Self-pay | Admitting: Family Medicine

## 2021-06-26 NOTE — Telephone Encounter (Signed)
Pharmacy call on behalf of patient to have prescription for all new diabetic supplies sent in  Fax number [phone]

## 2021-06-27 NOTE — Telephone Encounter (Signed)
This pt gets her diabetic supplies from [facility]. They need to call Dr Liliane Channel office

## 2021-07-04 ENCOUNTER — Ambulatory Visit (INDEPENDENT_AMBULATORY_CARE_PROVIDER_SITE_OTHER): Payer: Medicare HMO | Admitting: Pharmacist

## 2021-07-04 DIAGNOSIS — E1165 Type 2 diabetes mellitus with hyperglycemia: Secondary | ICD-10-CM

## 2021-07-04 DIAGNOSIS — E1169 Type 2 diabetes mellitus with other specified complication: Secondary | ICD-10-CM

## 2021-07-04 DIAGNOSIS — Z794 Long term (current) use of insulin: Secondary | ICD-10-CM

## 2021-07-04 DIAGNOSIS — E785 Hyperlipidemia, unspecified: Secondary | ICD-10-CM

## 2021-07-04 DIAGNOSIS — N184 Chronic kidney disease, stage 4 (severe): Secondary | ICD-10-CM

## 2021-07-04 DIAGNOSIS — I10 Essential (primary) hypertension: Secondary | ICD-10-CM

## 2021-07-04 NOTE — Patient Instructions (Signed)
Laura Atkins,  It was great to talk to you today!  Please call me with any questions or concerns.   Visit Information  PATIENT GOALS:  Goals Addressed             This Visit's Progress    Medication Management       Patient Goals/Self-Care Activities Over the next 90 days, patient will:  Focus on medication adherence by keeping up with prescription refills and either using a pill box or reminders to take your medications at the prescribed times Check blood sugar three times a day at the following times: 5-15 minutes before all meals, bedtime, and whenever patient experiences symptoms of hypo/hyperglycemia, document, and provide at future appointments Check blood pressure at least once daily, document, and provide at future appointments           The patient verbalized understanding of instructions, educational materials, and care plan provided today and declined offer to receive copy of patient instructions, educational materials, and care plan.   Telephone follow up appointment with care management team member scheduled for: 08/01/21 Kennon Holter, PharmD Clinical Pharmacist [facility] [phone]

## 2021-07-04 NOTE — Chronic Care Management (AMB) (Signed)
Chronic Care Management Pharmacy Note  07/04/2021 Name:  Laura Atkins MRN:  [id number] DOB:  [birth year]  Summary:  Type 2 Diabetes (Follows with Ms Marinus Maw @ endocrinology): Patient's daughter reports that the Tyler Aas is working much better than the NPH insulin.  Current glucose readings over last week per verbal report from blood glucose meter:  130-160 which is much improved from previously. Patient also has not experienced hypoglycemia  Patient identified as a good candidate for a GLP-1 receptor agonist given reduction in cardiovascular disease, slowed chronic kidney disease progression, low risk of hypoglycemia, increased satiety, and weight loss. May be able to improve blood glucose and decrease insulin requirements with a low risk of hypoglycemia.  Patient would like to use a Dexcom G6. Has a history of Freestyle [place] use but reports that she frequently had errors with the sensors. Will discuss with PCP and facilitate prescription through DME supplier if PCP agrees to see if we can get covered by insurance. If not, patient reports that she is willing to pay cash price at pharmacy.   Subjective: Laura Atkins is an 80 y.o. year old female who is a primary patient of Moshe Cipro, Norwood Levo, MD.  The CCM team was consulted for assistance with disease management and care coordination needs.    Engaged with patient's daughter [name] by telephone for follow up visit in response to provider referral for pharmacy case management and/or care coordination services.   Consent to Services:  The patient was given information about Chronic Care Management services, agreed to services, and gave verbal consent prior to initiation of services.  Please see initial visit note for detailed documentation.   Patient Care Team: Fayrene Helper, MD as PCP - General Branch, Alphonse Guild, MD as PCP - Cardiology (Cardiology) Marcial Pacas, MD as Consulting Physician (Neurology) Renato Shin, MD as  Consulting Physician (Endocrinology) Cloria Spring, MD as Consulting Physician ([place]) Alonza Smoker, LCSW as Social Worker (Psychiatry) Clent Jacks, MD as Consulting Physician (Ophthalmology) Beryle Lathe, [facility] (Pharmacist)  Objective:  Lab Results  Component Value Date   CREATININE 1.55 (H) 04/07/2021   CREATININE 1.13 (H) 06/03/2020   CREATININE 1.17 (H) 06/02/2020    Lab Results  Component Value Date   HGBA1C 10.0 (H) 04/07/2021   Last diabetic Eye exam:  Lab Results  Component Value Date/Time   HMDIABEYEEXA Retinopathy (A) 02/15/2021 12:00 AM    Last diabetic Foot exam: No results found for: HMDIABFOOTEX      Component Value Date/Time   CHOL 144 04/07/2021 1030   TRIG 111 04/07/2021 1030   HDL 58 04/07/2021 1030   CHOLHDL 2.6 12/01/2019 1137   VLDL 16 05/14/2017 1039   LDLCALC 66 04/07/2021 1030   LDLCALC 72 12/01/2019 1137    Hepatic Function Latest Ref Rng & Units 04/07/2021 06/03/2020 05/28/2020  Total Protein 6.0 - 8.5 g/dL 7.3 7.1 7.0  Albumin 3.7 - 4.7 g/dL 4.4 3.6 3.6  AST 0 - 40 IU/L 14 35 24  ALT 0 - 32 IU/L 16 38 23  Alk Phosphatase 44 - 121 IU/L 137(H) 76 78  Total Bilirubin 0.0 - 1.2 mg/dL 0.2 0.5 0.4  Bilirubin, Direct 0.0 - 0.3 mg/dL - - -    Lab Results  Component Value Date/Time   TSH 1.129 05/27/2020 06:28 PM   TSH 2.24 12/01/2019 11:37 AM   TSH 1.12 11/26/2018 09:45 AM    CBC Latest Ref Rng & Units 04/07/2021 06/03/2020 06/02/2020  WBC 3.4 - 10.8 x10E3/uL 6.1 6.3 6.6  Hemoglobin 11.1 - 15.9 g/dL 8.5(L) 9.6(L) 10.2(L)  Hematocrit 34.0 - 46.6 % 28.3(L) 33.7(L) 35.0(L)  Platelets 150 - 450 x10E3/uL 225 170 168    Lab Results  Component Value Date/Time   VD25OH 38 12/01/2019 11:37 AM   VD25OH 15 (L) 11/26/2018 09:45 AM    Clinical ASCVD: No  The 10-year ASCVD risk score (Arnett DK, et al., 2019) is: 28.6%   Values used to calculate the score:     Age: 66 years     Sex: Female     Is Non-Hispanic African  American: Yes     Diabetic: Yes     Tobacco smoker: No     Systolic Blood Pressure: 326 mmHg     Is BP treated: Yes     HDL Cholesterol: 58 mg/dL     Total Cholesterol: 144 mg/dL     Social History   Tobacco Use  Smoking Status Never  Smokeless Tobacco Never   BP Readings from Last 3 Encounters:  06/16/21 124/61  06/07/21 (!) 116/58  05/30/21 132/73   Pulse Readings from Last 3 Encounters:  06/16/21 72  06/07/21 70  05/30/21 89   Wt Readings from Last 3 Encounters:  06/16/21 257 lb (116.6 kg)  06/07/21 256 lb 3.2 oz (116.2 kg)  05/30/21 256 lb (116.1 kg)    Assessment: Review of patient past medical history, allergies, medications, health status, including review of consultants reports, laboratory and other test data, was performed as part of comprehensive evaluation and provision of chronic care management services.   SDOH:  (Social Determinants of Health) assessments and interventions performed:    CCM Care Plan  Allergies  Allergen Reactions   Penicillins Shortness Of Breath, Itching and Rash   Prednisone Shortness Of Breath, Itching and Rash   Propoxyphene N-Acetaminophen Itching and Nausea And Vomiting   Sulfa Antibiotics Itching and Nausea And Vomiting    Medications Reviewed Today     Reviewed by Beryle Lathe, [facility] (Pharmacist) on 07/04/21 at [place] List Status: <None>   Medication Order Taking? Sig Documenting Provider Last Dose Status Informant  Accu-Chek FastClix Lancets MISC [id number]  1 each by Does not apply route 4 (four) times daily. Use to monitor glucose levels 4 times daily; E11.65 Renato Shin, MD  Active Family Member  ACCU-CHEK GUIDE test strip [id number]  USE TO TEST BLOOD SUGAR FOUR TIMES DAILY Renato Shin, MD  Active   Alcohol Swabs (B-D SINGLE USE SWABS REGULAR) PADS [id number]  1 each by Does not apply route 4 (four) times daily. Use to prep site for glucose monitoring 4 times daily; E11.65 Renato Shin, MD  Active Family  Member  amLODipine (NORVASC) 5 MG tablet [id number] Yes TAKE 1 TABLET BY MOUTH EVERY DAY Fayrene Helper, MD Taking Active   aspirin 81 MG tablet [id number] Yes Take 81 mg by mouth daily. [provider] Taking Active Family Member  atorvastatin (LIPITOR) 40 MG tablet [id number] Yes TAKE 1 TABLET BY MOUTH EVERY DAY Fayrene Helper, MD Taking Active   benazepril (LOTENSIN) 20 MG tablet [id number] Yes TAKE 1 TABLET BY MOUTH EVERY DAY Fayrene Helper, MD Taking Active   Blood Glucose Monitoring Suppl (ACCU-CHEK GUIDE ME) w/Device KIT [id number]  1 each by Does not apply route 4 (four) times daily. Use to monitor glucose levels 4 times daily; E11.65 Renato Shin, MD  Active Family Member  buPROPion (WELLBUTRIN XL) 150 MG 24 hr  tablet [id number] Yes TAKE 1 TABLET BY MOUTH EVERY MORNING Cloria Spring, MD Taking Active   Cholecalciferol 125 MCG (5000 UT) TABS [id number] Yes Take 1 tablet by mouth daily. [provider] Taking Active Family Member  diphenhydrAMINE (BENADRYL) 25 MG tablet [id number]  Take 25 mg by mouth at bedtime. [provider]  Active Family Member  donepezil (ARICEPT) 5 MG tablet [id number] Yes TAKE 1 TABLET BY MOUTH AT BEDTIME Fayrene Helper, MD Taking Active   ferrous sulfate 325 (65 FE) MG EC tablet [id number] Yes Take 1 tablet by mouth 2 (two) times daily with a meal. [provider] Taking Active   gabapentin (NEURONTIN) 400 MG capsule [id number] Yes TAKE ONE CAPSULE BY MOUTH EVERY MORNING and TAKE ONE CAPSULE AT NOON and TAKE TWO CAPSULES AT BEDTIME Fayrene Helper, MD Taking Active   insulin degludec (TRESIBA FLEXTOUCH) 200 UNIT/ML FlexTouch Pen [id number] Yes Inject 14 Units into the skin at bedtime. Brita Romp, NP Taking Active   Insulin Pen Needle (PEN NEEDLES) 31G X 5 MM MISC [id number]  Use to inject insulin once daily Brita Romp, NP  Active   Insulin Syringe-Needle U-100 (INSULIN SYRINGE 1CC/31GX5/16") 31G X 5/16" 1  ML MISC [id number]  1 each by Does not apply route daily. Use to inject insulin daily; E11.65 Renato Shin, MD  Active Family Member  Multiple Vitamin (MULTIVITAMIN) capsule [id number] Yes Take 1 capsule by mouth daily. [provider] Taking Active   omeprazole (PRILOSEC) 20 MG capsule [id number] Yes TAKE ONE CAPSULE BY MOUTH EVERY DAY Fayrene Helper, MD Taking Active   potassium chloride SA (KLOR-CON) 20 MEQ tablet [id number] Yes TAKE 1 TABLET BY MOUTH TWICE DAILY Fayrene Helper, MD Taking Active   sertraline (ZOLOFT) 100 MG tablet [id number] Yes TAKE 1 TABLET BY MOUTH EVERY DAY Cloria Spring, MD Taking Active   tiZANidine (ZANAFLEX) 2 MG tablet [id number] Yes Take 1 tablet (2 mg total) by mouth every 6 (six) hours as needed for muscle spasms. Scot Jun, FNP Taking Active   torsemide (DEMADEX) 20 MG tablet [id number] Yes TAKE 40 MG EVERY OTHER DAY ALTERNATING 60 MG EVERY OTHER DAY Arnoldo Lenis, MD Taking Active   UNABLE TO FIND [id number]  Walker x 1  DX unsteady gait, osteoarthritis of left knee Fayrene Helper, MD  Active   UNABLE TO FIND [id number]  Elevated [place] Seat x 1 DX: unsteady gait, back pain, osteoarthritis of left knee Fayrene Helper, MD  Active   UNABLE TO FIND [id number]  Standing upright walker x 1  DX M54.40, M19.90 Fayrene Helper, MD  Active   UNABLE TO FIND [id number]  Incontinence pads and supplies Noreene Larsson, NP  Active   UNABLE TO FIND [phone]  [place], [place], MD  Active   UNABLE TO FIND [phone]  XL Tranquility Briefs for incontinence. Fayrene Helper, MD  Active   Med List Note Perlie Mayo, NP 02/06/19 1038):                Patient Active Problem List   Diagnosis Date Noted   Recurrent UTI 05/21/2021   Acute cystitis with hematuria 04/16/2021   Pressure injury of right heel, unstageable ([place]) 01/06/2021   Polyneuropathy associated with underlying disease ([place]) 01/06/2021   Chronic  kidney disease, stage 4 (severe) ([place]) 01/06/2021   Dementia ([place]) 07/13/2020   Cognitive deficits    Carotid bruit 05/26/2020   Hyperlipidemia associated with  type 2 diabetes mellitus ([place]) 12/08/2019   Left knee pain 02/11/2017   Iron deficiency 01/21/2017   Vitamin D deficiency 10/15/2016   Urinary incontinence 10/15/2016   Limited mobility 05/27/2016   Type 2 diabetes mellitus with hyperglycemia ([place]) 02/04/2016   Sleep terror disorder 12/14/2015   Fecal incontinence 12/13/2015   Back pain of lumbar region with sciatica 04/15/2015   Spondylosis, cervical, with myelopathy 04/14/2015   OSA (obstructive sleep apnea) 04/10/2013   Peripheral neuropathy 03/12/2013   Hearing loss 11/06/2012   Seizure-like activity ([place]) 01/25/2012   UNSTEADY GAIT 11/07/2010   Alpha thalassemia ([place]) 01/26/2010   Morbid obesity ([place]) 04/21/2008   Anxiety and depression 04/21/2008   Essential hypertension 04/21/2008   GERD 04/21/2008   Osteoarthritis of left knee 04/21/2008    Immunization History  Administered Date(s) Administered   Fluad Quad(high Dose 65+) 05/20/2019   H1N1 07/15/2008   Influenza Split 06/17/2012   Influenza Whole 07/03/2007, 06/17/2009, 06/07/2011   Influenza, High Dose Seasonal PF 07/17/2018   Influenza,inj,Quad PF,6+ Mos 07/13/2013, 09/06/2014, 08/10/2015, 05/23/2016, 05/22/2017   Pneumococcal Conjugate-13 05/03/2014   Pneumococcal Polysaccharide-23 09/08/2010   Td 09/08/2010   Tdap 07/17/2018   Unspecified SARS-COV-2 Vaccination 09/25/2019, 10/26/2019    Conditions to be addressed/monitored: HTN, HLD, DMII, and CKD Stage 3b  Care Plan : Medication Management  Updates made by Beryle Lathe, [place] since 07/04/2021 12:00 AM     Problem: HTN, T2DM, CKD, HLD   Priority: High  Onset Date: 06/01/2021     Long-Range Goal: Disease Progression Prevention   Start Date: 06/01/2021  Expected End Date: 08/30/2021  Recent Progress: On track  Priority: High  Note:    Current Barriers:  Unable to independently monitor therapeutic efficacy Unable to achieve control of diabetes  Pharmacist Clinical Goal(s):  Over the next 90 days, patient will Achieve adherence to monitoring guidelines and medication adherence to achieve therapeutic efficacy Achieve control of diabetes as evidenced by improved fasting blood sugar and improved A1c through collaboration with PharmD and provider.   Interventions: 1:1 collaboration with Fayrene Helper, MD regarding development and update of comprehensive plan of care as evidenced by provider attestation and co-signature Inter-disciplinary care team collaboration (see longitudinal plan of care) Comprehensive medication review performed; medication list updated in electronic medical record  Type 2 Diabetes (Follows with Ms Marinus Maw @ endocrinology): Uncontrolled; Most recent A1c 10.0%. Given patient's other comorbidities, A1c goal 7-7.5% is reasonable Current medications: insulin degludec Tyler Aas) 14 units subcutaneously once daily Recently transitioned from NPH No metformin given renal insuffiencey. Avoid SGLT2 inhibitors given recurrent UTIs Intolerances: none Taking medications as directed: yes Side effects thought to be attributed to current medication regimen: no Denies hypoglycemic symptoms (sweaty and shaky). Hypoglycemia prevention: not indicated at this time Current meal patterns: not discussed today Current exercise:  very limited On a statin: yes On aspirin 81 mg daily: yes Last microalbumin: 214 (04/07/21); on an ACEi/ARB: yes Current glucose readings over last week per verbal report from blood glucose meter:  130-160 which is much improved from previously. Patient also has not experienced hypoglycemia  The following education was provided: Introduction to diabetes basic facts Medication: Identify diabetes medication and when best taken Monitoring: target blood sugar range, when to test Hypoglycemia: I have  discussed with the patient how to treat hypoglycemia by the rule of 15; eat/drink 15g of sugar in the form of glucose tabs, 4 ounces of juice or soda and recheck fingerstick glucose in 15 minutes. Chocolate bars and  ice cream should be avoided because fat delays carbohydrate digestion and absorption. Retreat if glucose remains low. Driving should cease until glucose is normal. Instructed to monitor blood sugars three times a day at the following times: 5-15 minutes before all meals, bedtime, and whenever patient experiences symptoms of hypo/hyperglycemia  Patient identified as a good candidate for a GLP-1 receptor agonist given reduction in cardiovascular disease, slowed chronic kidney disease progression, low risk of hypoglycemia, increased satiety, and weight loss. May be able to improve blood glucose and decrease insulin requirements with a low risk of hypoglycemia.  Patient would like to use a Dexcom G6. Has a history of Freestyle [place] use but reports that she frequently had errors with the sensors. Will discuss with PCP and facilitate prescription through DME supplier if PCP agrees to see if we can get covered by insurance. If not, patient reports that she is willing to pay cash price at pharmacy.  Continue current insulin regimen per endocrinology  Hypertension: Blood pressure under good control. Blood pressure is at goal of <130/80 mmHg per 2017 AHA/ACC guidelines. Current medications: benazepril 20 mg by mouth once daily and amlodipine 5 mg by mouth once daily Intolerances: none Taking medications as directed: yes Side effects thought to be attributed to current medication regimen: no Current home blood pressure: not discussed today Continue benazepril 20 mg by mouth once daily and amlodipine 5 mg by mouth once daily Encourage dietary sodium restriction/DASH diet Recommend home blood pressure monitoring to discuss at next visit Discussed need for medication compliance  Hyperlipidemia  (follows with Dr. Harl Bowie): Controlled. LDL at goal of <70 due to very high risk given diabetes + at least 1 additional major risk factor (hypertension and chronic kidney disease (CKD)) per 2020 AACE/ACE guidelines and TG at goal of <150 per 2020 AACE/ACE guidelines. Current medications: atorvastatin 40 mg by mouth once daily Intolerances: none Taking medications as directed: yes Side effects thought to be attributed to current medication regimen: no Continue atorvastatin 40 mg by mouth once daily Discussed need for medication compliance  Chronic Kidney Disease Stage 3b - GFR 30-44 (Moderate to Severely Reduced Function) - follows with Dr. Theador Hawthorne: Appropriately managed Current medications: benazepril 20 mg by mouth once daily and atorvastatin 40 mg by mouth once daily Intolerances: none Taking medications as directed: yes Side effects thought to be attributed to current medication regimen: no Most recent GFR: 34 mL/min Most recent microalbumin: 214 (04/07/21) Continue benazepril 20 mg by mouth once daily and atorvastatin 40 mg by mouth once daily Recommend adequate hydration  Discussed need for improved control of blood sugar  Patient Goals/Self-Care Activities Over the next 90 days, patient will:  Focus on medication adherence by keeping up with prescription refills and either using a pill box or reminders to take your medications at the prescribed times Check blood sugar three times a day at the following times: 5-15 minutes before all meals, bedtime, and whenever patient experiences symptoms of hypo/hyperglycemia, document, and provide at future appointments Check blood pressure at least once daily, document, and provide at future appointments  Follow Up Plan: Telephone follow up appointment with care management team member scheduled for: 08/01/21      Medication Assistance: None required.  Patient affirms current coverage meets needs.  Patient's preferred pharmacy is:  [place], [place] [address] [place] [address] Phone: [phone] Fax: [phone]  [place], [place]  [place] [address] Phone: [phone] Fax: [phone] - [place], [place] [address] Phone: [phone] Fax: [phone]  Follow Up:  Patient agrees to Care Plan and Follow-up.  Plan: Telephone follow up appointment with care management team member scheduled for:  08/01/21  Kennon Holter, PharmD Clinical Pharmacist [facility] [phone]

## 2021-07-05 ENCOUNTER — Telehealth: Payer: Self-pay

## 2021-07-05 NOTE — Telephone Encounter (Signed)
Patient called and said she found what she needed at night time to let Dr Moshe Cipro know she seen it on tv.  Its called [place] for urine so she can use it at night time and would like to try it, phone # to [place] system [phone].

## 2021-07-06 NOTE — Telephone Encounter (Signed)
Daughter [name] stated understanding that med was called in

## 2021-07-12 ENCOUNTER — Encounter (HOSPITAL_COMMUNITY)
Admission: RE | Admit: 2021-07-12 | Discharge: 2021-07-12 | Disposition: A | Discharge status: Home / Self Care | Payer: Medicare HMO | Source: Ambulatory Visit | Attending: Nephrology | Admitting: Nephrology

## 2021-07-12 ENCOUNTER — Ambulatory Visit: Payer: Medicare HMO | Admitting: Urology

## 2021-07-12 DIAGNOSIS — N390 Urinary tract infection, site not specified: Secondary | ICD-10-CM

## 2021-07-12 DIAGNOSIS — D509 Iron deficiency anemia, unspecified: Secondary | ICD-10-CM | POA: Diagnosis not present

## 2021-07-12 DIAGNOSIS — N3001 Acute cystitis with hematuria: Secondary | ICD-10-CM

## 2021-07-12 MED ORDER — SODIUM CHLORIDE 0.9 % IV SOLN: INTRAVENOUS | Status: DC

## 2021-07-12 MED ORDER — SODIUM CHLORIDE 0.9 % IV SOLN
200.0000 mg | Freq: Once | INTRAVENOUS | Status: AC
  Administered 2021-07-12: 200 mg via INTRAVENOUS
  Filled 2021-07-12: qty 200

## 2021-07-13 ENCOUNTER — Encounter (HOSPITAL_COMMUNITY)
Admission: RE | Admit: 2021-07-13 | Discharge: 2021-07-13 | Disposition: A | Discharge status: Home / Self Care | Payer: Medicare HMO | Source: Ambulatory Visit | Attending: Nephrology | Admitting: Nephrology

## 2021-07-13 ENCOUNTER — Encounter (HOSPITAL_COMMUNITY): Payer: Self-pay

## 2021-07-13 DIAGNOSIS — D509 Iron deficiency anemia, unspecified: Secondary | ICD-10-CM | POA: Diagnosis not present

## 2021-07-13 MED ORDER — SODIUM CHLORIDE 0.9 % IV SOLN: INTRAVENOUS | Status: DC

## 2021-07-13 MED ORDER — SODIUM CHLORIDE 0.9 % IV SOLN
200.0000 mg | Freq: Once | INTRAVENOUS | Status: AC
  Administered 2021-07-13: 200 mg via INTRAVENOUS
  Filled 2021-07-13: qty 10

## 2021-07-14 ENCOUNTER — Encounter: Payer: Medicare HMO | Admitting: Family Medicine

## 2021-07-16 ENCOUNTER — Other Ambulatory Visit: Payer: Self-pay | Admitting: Family Medicine

## 2021-07-18 ENCOUNTER — Encounter (HOSPITAL_COMMUNITY)
Admission: RE | Admit: 2021-07-18 | Discharge: 2021-07-18 | Disposition: A | Discharge status: Home / Self Care | Payer: Medicare HMO | Source: Ambulatory Visit | Attending: Nephrology | Admitting: Nephrology

## 2021-07-18 DIAGNOSIS — D509 Iron deficiency anemia, unspecified: Secondary | ICD-10-CM | POA: Diagnosis not present

## 2021-07-18 MED ORDER — SODIUM CHLORIDE 0.9 % IV SOLN: INTRAVENOUS | Status: DC

## 2021-07-18 MED ORDER — SODIUM CHLORIDE 0.9 % IV SOLN
200.0000 mg | Freq: Once | INTRAVENOUS | Status: AC
  Administered 2021-07-18: 200 mg via INTRAVENOUS
  Filled 2021-07-18: qty 200

## 2021-07-19 ENCOUNTER — Ambulatory Visit: Payer: Medicare HMO | Admitting: Urology

## 2021-07-19 ENCOUNTER — Other Ambulatory Visit: Payer: Self-pay

## 2021-07-19 ENCOUNTER — Encounter (HOSPITAL_COMMUNITY)
Admission: RE | Admit: 2021-07-19 | Discharge: 2021-07-19 | Disposition: A | Discharge status: Home / Self Care | Payer: Medicare HMO | Source: Ambulatory Visit | Attending: Nephrology | Admitting: Nephrology

## 2021-07-19 DIAGNOSIS — D509 Iron deficiency anemia, unspecified: Secondary | ICD-10-CM | POA: Diagnosis not present

## 2021-07-19 MED ORDER — SODIUM CHLORIDE 0.9 % IV SOLN: INTRAVENOUS | Status: DC

## 2021-07-19 MED ORDER — SODIUM CHLORIDE 0.9 % IV SOLN
200.0000 mg | Freq: Once | INTRAVENOUS | Status: AC
  Administered 2021-07-19: 200 mg via INTRAVENOUS
  Filled 2021-07-19: qty 200

## 2021-07-19 NOTE — Progress Notes (Deleted)
Assessment: No diagnosis found.   Plan: ***  Chief Complaint: No chief complaint on file.   History of Present Illness:  Laura Atkins is a 80 y.o. year old female who is seen in consultation from Fayrene Helper, MD  for evaluation of ***.   Past Medical History:  Past Medical History:  Diagnosis Date   Alpha thalassemia trait 01/26/2010   02/2012: Nl CBC ex H&H-10.7/34.8, MCV-69    Anemia    Anxiety    Anxiety and depression    Cellulitis 05/09/2017   Depression    Diabetes mellitus    Foot pain, right 04/30/2013   GERD (gastroesophageal reflux disease)    Headache(784.0)    Hyperlipidemia    Hypertension    Iron deficiency 01/21/2017   Microcytic anemia 01/26/2010   02/2012: Nl CBC ex H&H-10.7/34.8, MCV-69    NECK PAIN, CHRONIC 10/21/2008   +chronic back pain     Obesity    Obstructive sleep apnea    Osteoarthritis    Left knee; right shoulder; chronic neck and back pain   Pruritus    PVD (peripheral vascular disease) ([place]) 01/28/2014   Seizures (HCC)    Shoulder pain, right 04/14/2015   Tremor    This started months ago after her seizure progressing to very poor hand writing   Urinary incontinence    UTI (urinary tract infection) 01/18/2013    Past Surgical History:  Past Surgical History:  Procedure Laterality Date   ABDOMINAL HYSTERECTOMY     BREAST EXCISIONAL BIOPSY     Left; cyst   CATARACT EXTRACTION Right    12/2017   CATARACT EXTRACTION W/ INTRAOCULAR LENS IMPLANT Left 09/07/2013   CHOLECYSTECTOMY     COLONOSCOPY     COLONOSCOPY N/A 07/20/2015   Procedure: COLONOSCOPY;  Surgeon: Rogene Houston, MD;  Location: [facility];  Service: Endoscopy;  Laterality: N/A;  930   EYE SURGERY Left 09/07/2013   cataract    Allergies:  Allergies  Allergen Reactions   Penicillins Shortness Of Breath, Itching and Rash   Prednisone Shortness Of Breath, Itching and Rash   Propoxyphene N-Acetaminophen Itching and Nausea And Vomiting   Sulfa Antibiotics  Itching and Nausea And Vomiting    Family History:  Family History  Problem Relation Age of Onset   Lung cancer Mother 79   Kidney disease Father    Diabetes Sister    Keloids Brother    ADD / ADHD Grandchild    Bipolar disorder Grandchild    Bipolar disorder Daughter    Seizures Daughter    Heart disease Daughter    Kidney disease Son    Neuropathy Son    Kidney disease Son    Edema Daughter    Breast cancer Daughter 20   Allergies Daughter    Alcohol abuse Neg Hx    Drug abuse Neg Hx     Social History:  Social History   Tobacco Use   Smoking status: Never   Smokeless tobacco: Never  Vaping Use   Vaping Use: Never used  Substance Use Topics   Alcohol use: No    Alcohol/week: 0.0 standard drinks   Drug use: No    Review of symptoms:  Constitutional:  Negative for unexplained weight loss, night sweats, fever, chills ENT:  Negative for nose bleeds, sinus pain, painful swallowing CV:  Negative for chest pain, shortness of breath, exercise intolerance, palpitations, loss of consciousness Resp:  Negative for cough, wheezing, shortness of breath GI:  Negative for nausea, vomiting, diarrhea, bloody stools GU:  Positives noted in HPI; otherwise negative for gross hematuria, dysuria, urinary incontinence Neuro:  Negative for seizures, poor balance, limb weakness, slurred speech Psych:  Negative for lack of energy, depression, anxiety Endocrine:  Negative for polydipsia, polyuria, symptoms of hypoglycemia (dizziness, hunger, sweating) Hematologic:  Negative for anemia, purpura, petechia, prolonged or excessive bleeding, use of anticoagulants  Allergic:  Negative for difficulty breathing or choking as a result of exposure to anything; no shellfish allergy; no allergic response (rash/itch) to materials, foods  Physical exam: There were no vitals taken for this visit. GENERAL APPEARANCE:  Well appearing, well developed, well nourished, NAD HEENT: Atraumatic, Normocephalic,  oropharynx clear. NECK: Supple without lymphadenopathy or thyromegaly. LUNGS: Clear to auscultation bilaterally. HEART: Regular Rate and Rhythm without murmurs, gallops, or rubs. ABDOMEN: Soft, non-tender, No Masses. EXTREMITIES: Moves all extremities well.  Without clubbing, cyanosis, or edema. NEUROLOGIC:  Alert and oriented x 3, normal gait, CN II-XII grossly intact.  MENTAL STATUS:  Appropriate. BACK:  Non-tender to palpation.  No CVAT SKIN:  Warm, dry and intact.    Results: No results found. However, due to the size of the patient record, not all encounters were searched. Please check Results Review for a complete set of results.

## 2021-07-20 ENCOUNTER — Ambulatory Visit: Payer: Medicare HMO | Admitting: Nurse Practitioner

## 2021-07-20 ENCOUNTER — Encounter (HOSPITAL_COMMUNITY): Admission: RE | Admit: 2021-07-20 | Payer: Medicare HMO | Source: Ambulatory Visit

## 2021-07-24 ENCOUNTER — Ambulatory Visit: Payer: Medicare HMO | Admitting: Urology

## 2021-07-24 DIAGNOSIS — N184 Chronic kidney disease, stage 4 (severe): Secondary | ICD-10-CM | POA: Diagnosis not present

## 2021-07-24 DIAGNOSIS — E1165 Type 2 diabetes mellitus with hyperglycemia: Secondary | ICD-10-CM | POA: Diagnosis not present

## 2021-07-24 DIAGNOSIS — E1169 Type 2 diabetes mellitus with other specified complication: Secondary | ICD-10-CM | POA: Diagnosis not present

## 2021-07-24 DIAGNOSIS — Z794 Long term (current) use of insulin: Secondary | ICD-10-CM | POA: Diagnosis not present

## 2021-07-24 DIAGNOSIS — E785 Hyperlipidemia, unspecified: Secondary | ICD-10-CM | POA: Diagnosis not present

## 2021-07-24 DIAGNOSIS — I10 Essential (primary) hypertension: Secondary | ICD-10-CM

## 2021-07-24 NOTE — Progress Notes (Deleted)
Assessment: No diagnosis found.   Plan: ***  Chief Complaint: No chief complaint on file.   History of Present Illness:  Laura Atkins is a 80 y.o. year old female who is seen in consultation from Fayrene Helper, MD  for evaluation of ***.   Past Medical History:  Past Medical History:  Diagnosis Date   Alpha thalassemia trait 01/26/2010   02/2012: Nl CBC ex H&H-10.7/34.8, MCV-69    Anemia    Anxiety    Anxiety and depression    Cellulitis 05/09/2017   Depression    Diabetes mellitus    Foot pain, right 04/30/2013   GERD (gastroesophageal reflux disease)    Headache(784.0)    Hyperlipidemia    Hypertension    Iron deficiency 01/21/2017   Microcytic anemia 01/26/2010   02/2012: Nl CBC ex H&H-10.7/34.8, MCV-69    NECK PAIN, CHRONIC 10/21/2008   +chronic back pain     Obesity    Obstructive sleep apnea    Osteoarthritis    Left knee; right shoulder; chronic neck and back pain   Pruritus    PVD (peripheral vascular disease) ([place]) 01/28/2014   Seizures (HCC)    Shoulder pain, right 04/14/2015   Tremor    This started months ago after her seizure progressing to very poor hand writing   Urinary incontinence    UTI (urinary tract infection) 01/18/2013    Past Surgical History:  Past Surgical History:  Procedure Laterality Date   ABDOMINAL HYSTERECTOMY     BREAST EXCISIONAL BIOPSY     Left; cyst   CATARACT EXTRACTION Right    12/2017   CATARACT EXTRACTION W/ INTRAOCULAR LENS IMPLANT Left 09/07/2013   CHOLECYSTECTOMY     COLONOSCOPY     COLONOSCOPY N/A 07/20/2015   Procedure: COLONOSCOPY;  Surgeon: Rogene Houston, MD;  Location: [facility];  Service: Endoscopy;  Laterality: N/A;  930   EYE SURGERY Left 09/07/2013   cataract    Allergies:  Allergies  Allergen Reactions   Penicillins Shortness Of Breath, Itching and Rash   Prednisone Shortness Of Breath, Itching and Rash   Propoxyphene N-Acetaminophen Itching and Nausea And Vomiting   Sulfa Antibiotics  Itching and Nausea And Vomiting    Family History:  Family History  Problem Relation Age of Onset   Lung cancer Mother 26   Kidney disease Father    Diabetes Sister    Keloids Brother    ADD / ADHD Grandchild    Bipolar disorder Grandchild    Bipolar disorder Daughter    Seizures Daughter    Heart disease Daughter    Kidney disease Son    Neuropathy Son    Kidney disease Son    Edema Daughter    Breast cancer Daughter 26   Allergies Daughter    Alcohol abuse Neg Hx    Drug abuse Neg Hx     Social History:  Social History   Tobacco Use   Smoking status: Never   Smokeless tobacco: Never  Vaping Use   Vaping Use: Never used  Substance Use Topics   Alcohol use: No    Alcohol/week: 0.0 standard drinks   Drug use: No    Review of symptoms:  Constitutional:  Negative for unexplained weight loss, night sweats, fever, chills ENT:  Negative for nose bleeds, sinus pain, painful swallowing CV:  Negative for chest pain, shortness of breath, exercise intolerance, palpitations, loss of consciousness Resp:  Negative for cough, wheezing, shortness of breath GI:  Negative for nausea, vomiting, diarrhea, bloody stools GU:  Positives noted in HPI; otherwise negative for gross hematuria, dysuria, urinary incontinence Neuro:  Negative for seizures, poor balance, limb weakness, slurred speech Psych:  Negative for lack of energy, depression, anxiety Endocrine:  Negative for polydipsia, polyuria, symptoms of hypoglycemia (dizziness, hunger, sweating) Hematologic:  Negative for anemia, purpura, petechia, prolonged or excessive bleeding, use of anticoagulants  Allergic:  Negative for difficulty breathing or choking as a result of exposure to anything; no shellfish allergy; no allergic response (rash/itch) to materials, foods  Physical exam: There were no vitals taken for this visit. GENERAL APPEARANCE:  Well appearing, well developed, well nourished, NAD HEENT: Atraumatic, Normocephalic,  oropharynx clear. NECK: Supple without lymphadenopathy or thyromegaly. LUNGS: Clear to auscultation bilaterally. HEART: Regular Rate and Rhythm without murmurs, gallops, or rubs. ABDOMEN: Soft, non-tender, No Masses. EXTREMITIES: Moves all extremities well.  Without clubbing, cyanosis, or edema. NEUROLOGIC:  Alert and oriented x 3, normal gait, CN II-XII grossly intact.  MENTAL STATUS:  Appropriate. BACK:  Non-tender to palpation.  No CVAT SKIN:  Warm, dry and intact.    Results: No results found. However, due to the size of the patient record, not all encounters were searched. Please check Results Review for a complete set of results.

## 2021-07-26 ENCOUNTER — Encounter (HOSPITAL_COMMUNITY): Payer: Self-pay

## 2021-07-26 ENCOUNTER — Encounter (HOSPITAL_COMMUNITY)
Admission: RE | Admit: 2021-07-26 | Discharge: 2021-07-26 | Disposition: A | Discharge status: Home / Self Care | Payer: Medicare HMO | Source: Ambulatory Visit | Attending: Nephrology | Admitting: Nephrology

## 2021-07-26 ENCOUNTER — Other Ambulatory Visit: Payer: Self-pay

## 2021-07-26 DIAGNOSIS — D509 Iron deficiency anemia, unspecified: Secondary | ICD-10-CM | POA: Insufficient documentation

## 2021-07-26 MED ORDER — SODIUM CHLORIDE 0.9 % IV SOLN
200.0000 mg | Freq: Once | INTRAVENOUS | Status: AC
  Administered 2021-07-26: 200 mg via INTRAVENOUS
  Filled 2021-07-26: qty 200

## 2021-07-26 MED ORDER — SODIUM CHLORIDE 0.9 % IV SOLN: Freq: Once | INTRAVENOUS | Status: AC

## 2021-08-01 ENCOUNTER — Ambulatory Visit (INDEPENDENT_AMBULATORY_CARE_PROVIDER_SITE_OTHER): Payer: Medicare HMO | Admitting: Pharmacist

## 2021-08-01 DIAGNOSIS — E1165 Type 2 diabetes mellitus with hyperglycemia: Secondary | ICD-10-CM

## 2021-08-01 DIAGNOSIS — I10 Essential (primary) hypertension: Secondary | ICD-10-CM

## 2021-08-01 DIAGNOSIS — E1169 Type 2 diabetes mellitus with other specified complication: Secondary | ICD-10-CM

## 2021-08-01 DIAGNOSIS — N184 Chronic kidney disease, stage 4 (severe): Secondary | ICD-10-CM

## 2021-08-01 DIAGNOSIS — E785 Hyperlipidemia, unspecified: Secondary | ICD-10-CM

## 2021-08-01 NOTE — Chronic Care Management (AMB) (Signed)
Chronic Care Management Pharmacy Note  08/01/2021 Name:  Laura Atkins MRN:  [id number] DOB:  11/04/1940  Summary:  General: Daughter reports that the patient gets overwhelmed if she has more than 1 appointment per day so that is why she has not showed up to her recent appointments with PCP, endocrinology, and urology. Plans to call and reschedule these.  Type 2 Diabetes (Follows with Ms Marinus Maw @ endocrinology): Current glucose readings over last week per verbal report from blood glucose meter:  90s-160 which is stable from previous visit. Patient also has not experienced hypoglycemia per se (Blood glucose <70) but does "talk out of her head" when her blood glucose drops into the 80-90s  Patient would like to use a Dexcom G6. Has a history of Freestyle [place] use but reports that she frequently had errors with the sensors. Rx for Dexcom G6 still pending through DME supplier. If not covered by insurance, patient reports that she is willing to pay cash price at pharmacy.  Continue current insulin regimen per endocrinology  Subjective: Laura Atkins is an 80 y.o. year old female who is a primary patient of Fayrene Helper, MD.  The CCM team was consulted for assistance with disease management and care coordination needs.    Engaged with patient's caregiver (daughter [name]) by telephone for follow up visit in response to provider referral for pharmacy case management and/or care coordination services.   Consent to Services:  The patient was given information about Chronic Care Management services, agreed to services, and gave verbal consent prior to initiation of services.  Please see initial visit note for detailed documentation.   Patient Care Team: Fayrene Helper, MD as PCP - General Branch, Alphonse Guild, MD as PCP - Cardiology (Cardiology) Marcial Pacas, MD as Consulting Physician (Neurology) Renato Shin, MD as Consulting Physician (Endocrinology) Cloria Spring, MD as  Consulting Physician ([place]) Alonza Smoker, LCSW as Social Worker (Psychiatry) Clent Jacks, MD as Consulting Physician (Ophthalmology) Beryle Lathe, [facility] (Pharmacist)  Objective:  Lab Results  Component Value Date   CREATININE 1.55 (H) 04/07/2021   CREATININE 1.13 (H) 06/03/2020   CREATININE 1.17 (H) 06/02/2020    Lab Results  Component Value Date   HGBA1C 10.0 (H) 04/07/2021   Last diabetic Eye exam:  Lab Results  Component Value Date/Time   HMDIABEYEEXA Retinopathy (A) 02/15/2021 12:00 AM    Last diabetic Foot exam: No results found for: HMDIABFOOTEX      Component Value Date/Time   CHOL 144 04/07/2021 1030   TRIG 111 04/07/2021 1030   HDL 58 04/07/2021 1030   CHOLHDL 2.6 12/01/2019 1137   VLDL 16 05/14/2017 1039   LDLCALC 66 04/07/2021 1030   LDLCALC 72 12/01/2019 1137    Hepatic Function Latest Ref Rng & Units 04/07/2021 06/03/2020 05/28/2020  Total Protein 6.0 - 8.5 g/dL 7.3 7.1 7.0  Albumin 3.7 - 4.7 g/dL 4.4 3.6 3.6  AST 0 - 40 IU/L 14 35 24  ALT 0 - 32 IU/L 16 38 23  Alk Phosphatase 44 - 121 IU/L 137(H) 76 78  Total Bilirubin 0.0 - 1.2 mg/dL 0.2 0.5 0.4  Bilirubin, Direct 0.0 - 0.3 mg/dL - - -    Lab Results  Component Value Date/Time   TSH 1.129 05/27/2020 06:28 PM   TSH 2.24 12/01/2019 11:37 AM   TSH 1.12 11/26/2018 09:45 AM    CBC Latest Ref Rng & Units 04/07/2021 06/03/2020 06/02/2020  WBC 3.4 - 10.8  x10E3/uL 6.1 6.3 6.6  Hemoglobin 11.1 - 15.9 g/dL 8.5(L) 9.6(L) 10.2(L)  Hematocrit 34.0 - 46.6 % 28.3(L) 33.7(L) 35.0(L)  Platelets 150 - 450 x10E3/uL 225 170 168    Lab Results  Component Value Date/Time   VD25OH 38 12/01/2019 11:37 AM   VD25OH 15 (L) 11/26/2018 09:45 AM    Clinical ASCVD: No  The 10-year ASCVD risk score (Arnett DK, et al., 2019) is: 35.3%   Values used to calculate the score:     Age: 80 years     Sex: Female     Is Non-Hispanic African American: Yes     Diabetic: Yes     Tobacco smoker: No      Systolic Blood Pressure: 245 mmHg     Is BP treated: Yes     HDL Cholesterol: 58 mg/dL     Total Cholesterol: 144 mg/dL    Social History   Tobacco Use  Smoking Status Never  Smokeless Tobacco Never   BP Readings from Last 3 Encounters:  07/26/21 (!) 152/69  07/19/21 (!) 150/67  07/18/21 106/70   Pulse Readings from Last 3 Encounters:  07/26/21 64  07/19/21 66  07/18/21 80   Wt Readings from Last 3 Encounters:  07/13/21 257 lb 0.9 oz (116.6 kg)  06/16/21 257 lb (116.6 kg)  06/07/21 256 lb 3.2 oz (116.2 kg)    Assessment: Review of patient past medical history, allergies, medications, health status, including review of consultants reports, laboratory and other test data, was performed as part of comprehensive evaluation and provision of chronic care management services.   SDOH:  (Social Determinants of Health) assessments and interventions performed:    CCM Care Plan  Allergies  Allergen Reactions   Penicillins Shortness Of Breath, Itching and Rash   Prednisone Shortness Of Breath, Itching and Rash   Propoxyphene N-Acetaminophen Itching and Nausea And Vomiting   Sulfa Antibiotics Itching and Nausea And Vomiting    Medications Reviewed Today     Reviewed by Beryle Lathe, [facility] (Pharmacist) on 08/01/21 at 1638  Med List Status: <None>   Medication Order Taking? Sig Documenting Provider Last Dose Status Informant  Accu-Chek FastClix Lancets MISC [id number]  1 each by Does not apply route 4 (four) times daily. Use to monitor glucose levels 4 times daily; E11.65 Renato Shin, MD  Active Family Member  ACCU-CHEK GUIDE test strip [id number]  USE TO TEST BLOOD SUGAR FOUR TIMES DAILY Renato Shin, MD  Active   Alcohol Swabs (B-D SINGLE USE SWABS REGULAR) PADS [id number]  1 each by Does not apply route 4 (four) times daily. Use to prep site for glucose monitoring 4 times daily; E11.65 Renato Shin, MD  Active Family Member  amLODipine (NORVASC) 5 MG tablet [id number] Yes  TAKE 1 TABLET BY MOUTH EVERY DAY Fayrene Helper, MD Taking Active   aspirin 81 MG tablet [id number] Yes Take 81 mg by mouth daily. [provider] Taking Active Family Member  atorvastatin (LIPITOR) 40 MG tablet [id number] Yes TAKE 1 TABLET BY MOUTH EVERY DAY Fayrene Helper, MD Taking Active   benazepril (LOTENSIN) 20 MG tablet [id number] Yes TAKE 1 TABLET BY MOUTH EVERY DAY Fayrene Helper, MD Taking Active   Blood Glucose Monitoring Suppl (ACCU-CHEK GUIDE ME) w/Device KIT [id number]  1 each by Does not apply route 4 (four) times daily. Use to monitor glucose levels 4 times daily; E11.65 Renato Shin, MD  Active Family Member  buPROPion (WELLBUTRIN XL) 150 MG 24 hr tablet [id number]  Yes TAKE 1 TABLET BY MOUTH EVERY MORNING Cloria Spring, MD Taking Active   Cholecalciferol 125 MCG (5000 UT) TABS [id number] Yes Take 1 tablet by mouth daily. [provider] Taking Active Family Member  diphenhydrAMINE (BENADRYL) 25 MG tablet [id number] No Take 25 mg by mouth at bedtime. [provider] Unknown Active Family Member  donepezil (ARICEPT) 5 MG tablet [id number] Yes TAKE 1 TABLET BY MOUTH AT BEDTIME Fayrene Helper, MD Taking Active   ferrous sulfate 325 (65 FE) MG EC tablet [id number] Yes Take 1 tablet by mouth 2 (two) times daily with a meal. [provider] Taking Active   gabapentin (NEURONTIN) 400 MG capsule [id number] Yes TAKE ONE CAPSULE BY MOUTH EVERY MORNING and TAKE ONE CAPSULE AT NOON and TAKE TWO CAPSULES AT BEDTIME Fayrene Helper, MD Taking Active   insulin degludec (TRESIBA FLEXTOUCH) 200 UNIT/ML FlexTouch Pen [id number] Yes Inject 14 Units into the skin at bedtime. Brita Romp, NP Taking Active   Insulin Pen Needle (PEN NEEDLES) 31G X 5 MM MISC [id number]  Use to inject insulin once daily Brita Romp, NP  Active   Insulin Syringe-Needle U-100 (INSULIN SYRINGE 1CC/31GX5/16") 31G X 5/16" 1 ML MISC [id number]  1 each by Does not apply  route daily. Use to inject insulin daily; E11.65 Renato Shin, MD  Active Family Member  Multiple Vitamin (MULTIVITAMIN) capsule [id number] Yes Take 1 capsule by mouth daily. [provider] Taking Active   omeprazole (PRILOSEC) 20 MG capsule [id number] Yes TAKE ONE CAPSULE BY MOUTH EVERY DAY Fayrene Helper, MD Taking Active   potassium chloride SA (KLOR-CON) 20 MEQ tablet [id number] Yes TAKE 1 TABLET BY MOUTH TWICE DAILY Fayrene Helper, MD Taking Active   sertraline (ZOLOFT) 100 MG tablet [id number] Yes TAKE 1 TABLET BY MOUTH EVERY DAY Cloria Spring, MD Taking Active   tiZANidine (ZANAFLEX) 2 MG tablet [id number] Yes Take 1 tablet (2 mg total) by mouth every 6 (six) hours as needed for muscle spasms. Scot Jun, FNP Taking Active   torsemide (DEMADEX) 20 MG tablet [id number] Yes TAKE 40 MG EVERY OTHER DAY ALTERNATING 60 MG EVERY OTHER DAY Arnoldo Lenis, MD Taking Active   UNABLE TO FIND [id number]  Walker x 1  DX unsteady gait, osteoarthritis of left knee Fayrene Helper, MD  Active   UNABLE TO FIND [id number]  Elevated Tol[place] Seat x 1 DX: unsteady gait, back pain, osteoarthritis of left knee Fayrene Helper, MD  Active   UNABLE TO FIND [id number]  Standing upright walker x 1  DX M54.40, M19.90 Fayrene Helper, MD  Active   UNABLE TO FIND [id number]  Incontinence pads and supplies Noreene Larsson, NP  Active   UNABLE TO FIND [phone]  [place], [place], MD  Active   UNABLE TO FIND [id number]  XL Tranquility Briefs for incontinence. Fayrene Helper, MD  Active   Med List Note Perlie Mayo, NP 02/06/19 1038):                Patient Active Problem List   Diagnosis Date Noted   Recurrent UTI 05/21/2021   Acute cystitis with hematuria 04/16/2021   Pressure injury of right heel, unstageable ([place]) 01/06/2021   Polyneuropathy associated with underlying disease ([place]) 01/06/2021   Chronic kidney disease, stage 4 (severe) ([place])  01/06/2021   Dementia ([place]) 07/13/2020   Cognitive deficits    Carotid bruit 05/26/2020   Hyperlipidemia associated with type 2  diabetes mellitus ([place]) 12/08/2019   Left knee pain 02/11/2017   Iron deficiency 01/21/2017   Vitamin D deficiency 10/15/2016   Urinary incontinence 10/15/2016   Limited mobility 05/27/2016   Type 2 diabetes mellitus with hyperglycemia ([place]) 02/04/2016   Sleep terror disorder 12/14/2015   Fecal incontinence 12/13/2015   Back pain of lumbar region with sciatica 04/15/2015   Spondylosis, cervical, with myelopathy 04/14/2015   OSA (obstructive sleep apnea) 04/10/2013   Peripheral neuropathy 03/12/2013   Hearing loss 11/06/2012   Seizure-like activity ([place]) 01/25/2012   UNSTEADY GAIT 11/07/2010   Alpha thalassemia ([place]) 01/26/2010   Morbid obesity ([place]) 04/21/2008   Anxiety and depression 04/21/2008   Essential hypertension 04/21/2008   GERD 04/21/2008   Osteoarthritis of left knee 04/21/2008    Immunization History  Administered Date(s) Administered   Fluad Quad(high Dose 65+) 05/20/2019   H1N1 07/15/2008   Influenza Split 06/17/2012   Influenza Whole 07/03/2007, 06/17/2009, 06/07/2011   Influenza, High Dose Seasonal PF 07/17/2018   Influenza,inj,Quad PF,6+ Mos 07/13/2013, 09/06/2014, 08/10/2015, 05/23/2016, 05/22/2017   Pneumococcal Conjugate-13 05/03/2014   Pneumococcal Polysaccharide-23 09/08/2010   Td 09/08/2010   Tdap 07/17/2018   Unspecified SARS-COV-2 Vaccination 09/25/2019, 10/26/2019    Conditions to be addressed/monitored: HTN, HLD, DMII, and CKD Stage 3b  Care Plan : Medication Management  Updates made by Beryle Lathe, [place] since 08/01/2021 12:00 AM     Problem: HTN, T2DM, CKD, HLD   Priority: High  Onset Date: 06/01/2021     Long-Range Goal: Disease Progression Prevention   Start Date: 06/01/2021  Expected End Date: 08/30/2021  Recent Progress: On track  Priority: High  Note:   Current Barriers:  Unable to  independently monitor therapeutic efficacy Unable to achieve control of diabetes  Pharmacist Clinical Goal(s):  Over the next 90 days, patient will Achieve adherence to monitoring guidelines and medication adherence to achieve therapeutic efficacy Achieve control of diabetes as evidenced by improved fasting blood sugar and improved A1c through collaboration with PharmD and provider.   Interventions: 1:1 collaboration with Fayrene Helper, MD regarding development and update of comprehensive plan of care as evidenced by provider attestation and co-signature Inter-disciplinary care team collaboration (see longitudinal plan of care) Comprehensive medication review performed; medication list updated in electronic medical record  Type 2 Diabetes (Follows with Ms Marinus Maw @ endocrinology): Uncontrolled; Most recent A1c 10.0%. Given patient's other comorbidities, A1c goal 7-7.5% is reasonable Current medications: insulin degludec Tyler Aas) 14 units subcutaneously once daily No metformin given renal insuffiencey. Avoid SGLT2 inhibitors given recurrent UTIs Intolerances: none Taking medications as directed: yes Side effects thought to be attributed to current medication regimen: no Denies hypoglycemic symptoms (sweaty and shaky). Hypoglycemia prevention: not indicated at this time Current meal patterns: not discussed today Current exercise:  very limited On a statin: yes On aspirin 81 mg daily: yes Last microalbumin: 214 (04/07/21); on an ACEi/ARB: yes Current glucose readings over last week per verbal report from blood glucose meter:  90s-160 which is stable from previous visit. Patient also has not experienced hypoglycemia per se (Blood glucose <70) but does "talk out of her head" when her blood glucose drops into the 80-90s  The following education was provided: Introduction to diabetes basic facts Medication: Identify diabetes medication and when best taken Monitoring: target blood sugar  range, when to test Hypoglycemia: I have discussed with the patient how to treat hypoglycemia by the rule of 15; eat/drink 15g of sugar in the form of glucose tabs, 4 ounces  of juice or soda and recheck fingerstick glucose in 15 minutes. Chocolate bars and ice cream should be avoided because fat delays carbohydrate digestion and absorption. Retreat if glucose remains low. Driving should cease until glucose is normal. Instructed to monitor blood sugars three times a day at the following times: 5-15 minutes before all meals, bedtime, and whenever patient experiences symptoms of hypo/hyperglycemia  Patient identified as a good candidate for a GLP-1 receptor agonist given reduction in cardiovascular disease, slowed chronic kidney disease progression, low risk of hypoglycemia, increased satiety, and weight loss. May be able to improve blood glucose and decrease insulin requirements with a low risk of hypoglycemia. Will defer to endocrinology.  Patient would like to use a Dexcom G6. Has a history of Freestyle [place] use but reports that she frequently had errors with the sensors. Rx for Dexcom G6 still pending through DME supplier. If not covered by insurance, patient reports that she is willing to pay cash price at pharmacy.  Continue current insulin regimen per endocrinology  Hypertension: Blood pressure under good control. Blood pressure is at goal of <130/80 mmHg per 2017 AHA/ACC guidelines. Current medications: benazepril 20 mg by mouth once daily and amlodipine 5 mg by mouth once daily Intolerances: none Taking medications as directed: yes Side effects thought to be attributed to current medication regimen: no Current home blood pressure: not discussed today Continue benazepril 20 mg by mouth once daily and amlodipine 5 mg by mouth once daily Encourage dietary sodium restriction/DASH diet Recommend home blood pressure monitoring to discuss at next visit Discussed need for medication  compliance  Hyperlipidemia (follows with Dr. Harl Bowie): Controlled. LDL at goal of <70 due to very high risk given diabetes + at least 1 additional major risk factor (hypertension and chronic kidney disease (CKD)) per 2020 AACE/ACE guidelines and TG at goal of <150 per 2020 AACE/ACE guidelines. Current medications: atorvastatin 40 mg by mouth once daily Intolerances: none Taking medications as directed: yes Side effects thought to be attributed to current medication regimen: no Continue atorvastatin 40 mg by mouth once daily Discussed need for medication compliance  Chronic Kidney Disease Stage 3b - GFR 30-44 (Moderate to Severely Reduced Function) - follows with Dr. Theador Hawthorne: Appropriately managed Current medications: benazepril 20 mg by mouth once daily and atorvastatin 40 mg by mouth once daily Intolerances: none Taking medications as directed: yes Side effects thought to be attributed to current medication regimen: no Most recent GFR: 34 mL/min Most recent microalbumin: 214 (04/07/21) Continue benazepril 20 mg by mouth once daily and atorvastatin 40 mg by mouth once daily Recommend adequate hydration  Discussed need for improved control of blood sugar  Patient Goals/Self-Care Activities Over the next 90 days, patient will:  Focus on medication adherence by keeping up with prescription refills and either using a pill box or reminders to take your medications at the prescribed times Check blood sugar three times a day at the following times: 5-15 minutes before all meals, bedtime, and whenever patient experiences symptoms of hypo/hyperglycemia, document, and provide at future appointments Check blood pressure at least once daily, document, and provide at future appointments  Follow Up Plan: Telephone follow up appointment with care management team member scheduled for: 08/23/21      Medication Assistance: None required.  Patient affirms current coverage meets needs.  Patient's  preferred pharmacy is:  [place], [place] [address] [place] [address] Phone: [phone] Fax: [phone]  [facility] - [facility], [place] [address] Phone: [phone] Fax: [phone] - [place], [place] [address] Phone: [phone] Fax: [phone]  Follow Up:  Patient agrees to Care Plan and Follow-up.  Plan: Telephone follow up appointment with care management team member scheduled for:  08/23/21  Kennon Holter, PharmD Clinical Pharmacist [facility] [phone]

## 2021-08-01 NOTE — Patient Instructions (Signed)
Laura Atkins,  It was great to talk to you today!  Please call me with any questions or concerns.   Visit Information  Patient Goals/Self-Care Activities Over the next 90 days, patient will:  Focus on medication adherence by keeping up with prescription refills and either using a pill box or reminders to take your medications at the prescribed times Check blood sugar three times a day at the following times: 5-15 minutes before all meals, bedtime, and whenever patient experiences symptoms of hypo/hyperglycemia, document, and provide at future appointments Check blood pressure at least once daily, document, and provide at future appointments  The patient verbalized understanding of instructions, educational materials, and care plan provided today and declined offer to receive copy of patient instructions, educational materials, and care plan.   Telephone follow up appointment with care management team member scheduled for:08/23/21  Kennon Holter, PharmD Clinical Pharmacist [facility] [phone]

## 2021-08-08 ENCOUNTER — Encounter: Payer: Self-pay | Admitting: Urology

## 2021-08-08 ENCOUNTER — Ambulatory Visit (INDEPENDENT_AMBULATORY_CARE_PROVIDER_SITE_OTHER): Payer: Medicare HMO | Admitting: Urology

## 2021-08-08 ENCOUNTER — Other Ambulatory Visit: Payer: Self-pay

## 2021-08-08 VITALS — BP 159/76 | HR 76 | Temp 97.6°F | Wt 256.8 lb

## 2021-08-08 DIAGNOSIS — N3941 Urge incontinence: Secondary | ICD-10-CM

## 2021-08-08 DIAGNOSIS — Z87440 Personal history of urinary (tract) infections: Secondary | ICD-10-CM

## 2021-08-08 DIAGNOSIS — R310 Gross hematuria: Secondary | ICD-10-CM | POA: Diagnosis not present

## 2021-08-08 LAB — BLADDER SCAN AMB NON-IMAGING: Scan Result: 97

## 2021-08-08 MED ORDER — MIRABEGRON ER 25 MG PO TB24: 25.0000 mg | ORAL_TABLET | Freq: Every day | ORAL | 0 refills | Status: DC

## 2021-08-08 NOTE — Progress Notes (Signed)
Assessment: 1. Urge incontinence   2. History of UTI     Plan: I reviewed the patient's records including office notes and laboratory results. Diagnosis and management of overactive bladder and urge incontinence discussed with the patient and her daughter.  Options for management including avoidance of dietary irritants, behavioral modification, medical therapy, neuromodulation, and chemodenervation discussed. Trial of Myrbetriq 25 mg daily.  Samples provided.  Use and side effects discussed. Methods to reduce the risk of UTIs including increased fluid intake, timed and double voiding, daily cranberry supplement, and daily probiotics discussed. Return to office in 1 month.  Chief Complaint:  Chief Complaint  Patient presents with   Urinary Incontinence    History of Present Illness:  Laura Atkins is a 80 y.o. year old female who is seen in consultation from Fayrene Helper, MD for evaluation of urinary incontinence and UTI's.  She has had symptoms for 10+ years.  She has urinary frequency, urgency, and urge incontinence.  She is using maxipads twice daily.  No prior medical therapy for her incontinence symptoms.  She does have a history of UTIs. Urine culture results: 10/21: E. coli 1/22: <10K colonies 2/22: <10K colonies 7/22: E. coli 8/25: E. Coli  She typically has symptoms of increased frequency and dysuria associated with UTIs.  No current UTI symptoms.  No history of gross hematuria.  She does have chronic kidney disease and is followed by nephrology. Renal ultrasound from 05/19/2021 showed no mass or hydronephrosis involving either kidney and a small amount of layering debris in the bladder.   Past Medical History:  Past Medical History:  Diagnosis Date   Alpha thalassemia trait 01/26/2010   02/2012: Nl CBC ex H&H-10.7/34.8, MCV-69    Anemia    Anxiety    Anxiety and depression    Cellulitis 05/09/2017   Depression    Diabetes mellitus    Foot pain, right  04/30/2013   GERD (gastroesophageal reflux disease)    Headache(784.0)    Hyperlipidemia    Hypertension    Iron deficiency 01/21/2017   Microcytic anemia 01/26/2010   02/2012: Nl CBC ex H&H-10.7/34.8, MCV-69    NECK PAIN, CHRONIC 10/21/2008   +chronic back pain     Obesity    Obstructive sleep apnea    Osteoarthritis    Left knee; right shoulder; chronic neck and back pain   Pruritus    PVD (peripheral vascular disease) ([place]) 01/28/2014   Seizures ([facility])    Shoulder pain, right 04/14/2015   Tremor    This started months ago after her seizure progressing to very poor hand writing   Urinary incontinence    UTI (urinary tract infection) 01/18/2013    Past Surgical History:  Past Surgical History:  Procedure Laterality Date   ABDOMINAL HYSTERECTOMY     BREAST EXCISIONAL BIOPSY     Left; cyst   CATARACT EXTRACTION Right    12/2017   CATARACT EXTRACTION W/ INTRAOCULAR LENS IMPLANT Left 09/07/2013   CHOLECYSTECTOMY     COLONOSCOPY     COLONOSCOPY N/A 07/20/2015   Procedure: COLONOSCOPY;  Surgeon: Rogene Houston, MD;  Location: [facility];  Service: Endoscopy;  Laterality: N/A;  930   EYE SURGERY Left 09/07/2013   cataract    Allergies:  Allergies  Allergen Reactions   Penicillins Shortness Of Breath, Itching and Rash   Prednisone Shortness Of Breath, Itching and Rash   Propoxyphene N-Acetaminophen Itching and Nausea And Vomiting   Sulfa Antibiotics Itching and Nausea  And Vomiting    Family History:  Family History  Problem Relation Age of Onset   Lung cancer Mother 54   Kidney disease Father    Diabetes Sister    Keloids Brother    ADD / ADHD Grandchild    Bipolar disorder Grandchild    Bipolar disorder Daughter    Seizures Daughter    Heart disease Daughter    Kidney disease Son    Neuropathy Son    Kidney disease Son    Edema Daughter    Breast cancer Daughter 69   Allergies Daughter    Alcohol abuse Neg Hx    Drug abuse Neg Hx     Social History:  Social  History   Tobacco Use   Smoking status: Never   Smokeless tobacco: Never  Vaping Use   Vaping Use: Never used  Substance Use Topics   Alcohol use: No    Alcohol/week: 0.0 standard drinks   Drug use: No    Review of symptoms:  Constitutional:  Negative for unexplained weight loss, night sweats, fever, chills ENT:  Negative for nose bleeds, sinus pain, painful swallowing CV:  Negative for chest pain, shortness of breath, exercise intolerance, palpitations, loss of consciousness Resp:  Negative for cough, wheezing, shortness of breath GI:  Negative for nausea, vomiting, diarrhea, bloody stools GU:  Positives noted in HPI; otherwise negative for gross hematuria, dysuria, urinary incontinence Neuro:  Negative for seizures, poor balance, limb weakness, slurred speech Psych:  Negative for lack of energy, depression, anxiety Endocrine:  Negative for polydipsia, polyuria, symptoms of hypoglycemia (dizziness, hunger, sweating) Hematologic:  Negative for anemia, purpura, petechia, prolonged or excessive bleeding, use of anticoagulants  Allergic:  Negative for difficulty breathing or choking as a result of exposure to anything; no shellfish allergy; no allergic response (rash/itch) to materials, foods  Physical exam: BP (!) 159/76   Pulse 76   Temp 97.6 F (36.4 C)   Wt 256 lb 12.8 oz (116.5 kg)   BMI 39.05 kg/m  GENERAL APPEARANCE:  Well appearing, well developed, well nourished, NAD HEENT: Atraumatic, Normocephalic, oropharynx clear. NECK: Supple without lymphadenopathy or thyromegaly. LUNGS: Clear to auscultation bilaterally. HEART: Regular Rate and Rhythm without murmurs, gallops, or rubs. ABDOMEN: Soft, non-tender, No Masses. EXTREMITIES: Moves all extremities well.  Without clubbing, cyanosis, or edema. NEUROLOGIC:  Alert and oriented x 3, CN II-XII grossly intact.  MENTAL STATUS:  Appropriate. BACK:  Non-tender to palpation.  No CVAT SKIN:  Warm, dry and intact.     Results: U/A:  0-5 WBC, 0-2 RBCs, few bacteria  PVR: 97 ml

## 2021-08-08 NOTE — Progress Notes (Signed)
Urological Symptom Review  Patient is experiencing the following symptoms: Frequent urination   Review of Systems  Gastrointestinal (upper)  : Negative for upper GI symptoms  Gastrointestinal (lower) : Negative for lower GI symptoms  Constitutional : Negative for symptoms  Skin: Negative for skin symptoms  Eyes: Negative for eye symptoms  Ear/Nose/Throat : Negative for Ear/Nose/Throat symptoms  Hematologic/Lymphatic: Negative for Hematologic/Lymphatic symptoms  Cardiovascular : Leg swelling  Respiratory : Negative for respiratory symptoms  Endocrine: Negative for endocrine symptoms  Musculoskeletal: Negative for musculoskeletal symptoms  Neurological: Negative for neurological symptoms  Psychologic: Negative for psychiatric symptoms

## 2021-08-08 NOTE — Progress Notes (Signed)
post void residual=97

## 2021-08-09 ENCOUNTER — Encounter: Payer: Self-pay | Admitting: Nurse Practitioner

## 2021-08-09 ENCOUNTER — Ambulatory Visit (INDEPENDENT_AMBULATORY_CARE_PROVIDER_SITE_OTHER): Payer: Medicare HMO | Admitting: Nurse Practitioner

## 2021-08-09 VITALS — BP 109/55 | HR 80 | Ht 68.0 in | Wt 257.0 lb

## 2021-08-09 DIAGNOSIS — N1832 Chronic kidney disease, stage 3b: Secondary | ICD-10-CM

## 2021-08-09 DIAGNOSIS — I10 Essential (primary) hypertension: Secondary | ICD-10-CM

## 2021-08-09 DIAGNOSIS — E782 Mixed hyperlipidemia: Secondary | ICD-10-CM

## 2021-08-09 DIAGNOSIS — E1122 Type 2 diabetes mellitus with diabetic chronic kidney disease: Secondary | ICD-10-CM | POA: Diagnosis not present

## 2021-08-09 DIAGNOSIS — Z794 Long term (current) use of insulin: Secondary | ICD-10-CM

## 2021-08-09 LAB — URINALYSIS, ROUTINE W REFLEX MICROSCOPIC
Bilirubin, UA: NEGATIVE
Glucose, UA: NEGATIVE
Ketones, UA: NEGATIVE
Nitrite, UA: NEGATIVE
Protein,UA: NEGATIVE
RBC, UA: NEGATIVE
Specific Gravity, UA: 1.015 (ref 1.005–1.030)
Urobilinogen, Ur: 0.2 mg/dL (ref 0.2–1.0)
pH, UA: 5.5 (ref 5.0–7.5)

## 2021-08-09 LAB — MICROSCOPIC EXAMINATION: RBC, Urine: NONE SEEN /hpf (ref 0–2)

## 2021-08-09 MED ORDER — DEXCOM G6 RECEIVER DEVI: 0 refills | Status: DC

## 2021-08-09 MED ORDER — DEXCOM G6 SENSOR MISC: 3 refills | Status: DC

## 2021-08-09 MED ORDER — DEXCOM G6 TRANSMITTER MISC: 3 refills | Status: DC

## 2021-08-09 NOTE — Progress Notes (Signed)
Endocrinology Follow Up Note       08/09/2021, 3:28 PM   Subjective:    Patient ID: Laura Atkins, female    DOB: 08-26-1941.  Laura Atkins is being seen in follow up after being seen in consultation for management of currently uncontrolled symptomatic diabetes requested by  Fayrene Helper, MD.   Past Medical History:  Diagnosis Date   Alpha thalassemia trait 01/26/2010   02/2012: Nl CBC ex H&H-10.7/34.8, MCV-69    Anemia    Anxiety    Anxiety and depression    Cellulitis 05/09/2017   Depression    Diabetes mellitus    Foot pain, right 04/30/2013   GERD (gastroesophageal reflux disease)    Headache(784.0)    Hyperlipidemia    Hypertension    Iron deficiency 01/21/2017   Microcytic anemia 01/26/2010   02/2012: Nl CBC ex H&H-10.7/34.8, MCV-69    NECK PAIN, CHRONIC 10/21/2008   +chronic back pain     Obesity    Obstructive sleep apnea    Osteoarthritis    Left knee; right shoulder; chronic neck and back pain   Pruritus    PVD (peripheral vascular disease) ([place]) 01/28/2014   Seizures (HCC)    Shoulder pain, right 04/14/2015   Tremor    This started months ago after her seizure progressing to very poor hand writing   Urinary incontinence    UTI (urinary tract infection) 01/18/2013    Past Surgical History:  Procedure Laterality Date   ABDOMINAL HYSTERECTOMY     BREAST EXCISIONAL BIOPSY     Left; cyst   CATARACT EXTRACTION Right    12/2017   CATARACT EXTRACTION W/ INTRAOCULAR LENS IMPLANT Left 09/07/2013   CHOLECYSTECTOMY     COLONOSCOPY     COLONOSCOPY N/A 07/20/2015   Procedure: COLONOSCOPY;  Surgeon: Rogene Houston, MD;  Location: [facility];  Service: Endoscopy;  Laterality: N/A;  930   EYE SURGERY Left 09/07/2013   cataract    Social History   Socioeconomic History   Marital status: Married    Spouse name: Laura Atkins   Number of children: 5   Years of education: 12   Highest  education level: Not on file  Occupational History   Occupation: Disabled     Employer: RETIRED  Tobacco Use   Smoking status: Never   Smokeless tobacco: Never  Vaping Use   Vaping Use: Never used  Substance and Sexual Activity   Alcohol use: No    Alcohol/week: 0.0 standard drinks   Drug use: No   Sexual activity: Yes    Birth control/protection: Surgical  Other Topics Concern   Not on file  Social History Narrative   07/27/20 Patient lives at home with her husband Laura Atkins) and dgtr- Laura Atkins.  Patient is retired.    Right handed.    Five Children.    Caffeine- 2 daily   Social Determinants of Health   Financial Resource Strain: Not on file  Food Insecurity: No Food Insecurity   Worried About Charity fundraiser in the Last Year: Never true   Ran Out of Food in the Last Year: Never true  Transportation Needs: No Transportation Needs  Lack of Transportation (Medical): No   Lack of Transportation (Non-Medical): No  Physical Activity: Inactive   Days of Exercise per Week: 0 days   Minutes of Exercise per Session: 0 min  Stress: No Stress Concern Present   Feeling of Stress : Not at all  Social Connections: Moderately Integrated   Frequency of Communication with Friends and Family: More than three times a week   Frequency of Social Gatherings with Friends and Family: More than three times a week   Attends Religious Services: More than 4 times per year   Active Member of [employer] or Organizations: No   Attends Archivist Meetings: Never   Marital Status: Married    Family History  Problem Relation Age of Onset   Lung cancer Mother 2   Kidney disease Father    Diabetes Sister    Keloids Brother    ADD / ADHD Grandchild    Bipolar disorder Grandchild    Bipolar disorder Daughter    Seizures Daughter    Heart disease Daughter    Kidney disease Son    Neuropathy Son    Kidney disease Son    Edema Daughter    Breast cancer Daughter 9   Allergies Daughter     Alcohol abuse Neg Hx    Drug abuse Neg Hx     Outpatient Encounter Medications as of 08/09/2021  Medication Sig   Accu-Chek FastClix Lancets MISC 1 each by Does not apply route 4 (four) times daily. Use to monitor glucose levels 4 times daily; E11.65   ACCU-CHEK GUIDE test strip USE TO TEST BLOOD SUGAR FOUR TIMES DAILY   Alcohol Swabs (B-D SINGLE USE SWABS REGULAR) PADS 1 each by Does not apply route 4 (four) times daily. Use to prep site for glucose monitoring 4 times daily; E11.65   amLODipine (NORVASC) 5 MG tablet TAKE 1 TABLET BY MOUTH EVERY DAY   aspirin 81 MG tablet Take 81 mg by mouth daily.   atorvastatin (LIPITOR) 40 MG tablet TAKE 1 TABLET BY MOUTH EVERY DAY   benazepril (LOTENSIN) 20 MG tablet TAKE 1 TABLET BY MOUTH EVERY DAY   Blood Glucose Monitoring Suppl (ACCU-CHEK GUIDE ME) w/Device KIT 1 each by Does not apply route 4 (four) times daily. Use to monitor glucose levels 4 times daily; E11.65   buPROPion (WELLBUTRIN XL) 150 MG 24 hr tablet TAKE 1 TABLET BY MOUTH EVERY MORNING   Cholecalciferol 125 MCG (5000 UT) TABS Take 1 tablet by mouth daily.   Continuous Blood Gluc Receiver (DEXCOM G6 RECEIVER) DEVI Use to monitor glucose at least twice daily   Continuous Blood Gluc Sensor (DEXCOM G6 SENSOR) MISC Change sensor every 10 days as directed   Continuous Blood Gluc Transmit (DEXCOM G6 TRANSMITTER) MISC Change transmitter every 90 days as directed.   diphenhydrAMINE (BENADRYL) 25 MG tablet Take 25 mg by mouth at bedtime.   donepezil (ARICEPT) 5 MG tablet TAKE 1 TABLET BY MOUTH AT BEDTIME   ferrous sulfate 325 (65 FE) MG EC tablet Take 1 tablet by mouth 2 (two) times daily with a meal.   gabapentin (NEURONTIN) 400 MG capsule TAKE ONE CAPSULE BY MOUTH EVERY MORNING and TAKE ONE CAPSULE AT NOON and TAKE TWO CAPSULES AT BEDTIME   insulin degludec (TRESIBA FLEXTOUCH) 200 UNIT/ML FlexTouch Pen Inject 14 Units into the skin at bedtime.   Insulin Pen Needle (PEN NEEDLES) 31G X 5 MM MISC  Use to inject insulin once daily   Insulin Syringe-Needle U-100 (INSULIN  SYRINGE 1CC/31GX5/16") 31G X 5/16" 1 ML MISC 1 each by Does not apply route daily. Use to inject insulin daily; E11.65   mirabegron ER (MYRBETRIQ) 25 MG TB24 tablet Take 1 tablet (25 mg total) by mouth daily.   Multiple Vitamin (MULTIVITAMIN) capsule Take 1 capsule by mouth daily.   omeprazole (PRILOSEC) 20 MG capsule TAKE ONE CAPSULE BY MOUTH EVERY DAY   potassium chloride SA (KLOR-CON) 20 MEQ tablet TAKE 1 TABLET BY MOUTH TWICE DAILY   sertraline (ZOLOFT) 100 MG tablet TAKE 1 TABLET BY MOUTH EVERY DAY   tiZANidine (ZANAFLEX) 2 MG tablet Take 1 tablet (2 mg total) by mouth every 6 (six) hours as needed for muscle spasms.   torsemide (DEMADEX) 20 MG tablet TAKE 40 MG EVERY OTHER DAY ALTERNATING 60 MG EVERY OTHER DAY   UNABLE TO FIND Walker x 1  DX unsteady gait, osteoarthritis of left knee   UNABLE TO FIND Elevated Toliet Seat x 1 DX: unsteady gait, back pain, osteoarthritis of left knee   UNABLE TO FIND Standing upright walker x 1  DX M54.40, M19.90   UNABLE TO FIND Incontinence pads and supplies   UNABLE TO FIND Rollator Walker   UNABLE TO FIND XL Tranquility Briefs for incontinence.   No facility-administered encounter medications on file as of 08/09/2021.    ALLERGIES: Allergies  Allergen Reactions   Penicillins Shortness Of Breath, Itching and Rash   Prednisone Shortness Of Breath, Itching and Rash   Propoxyphene N-Acetaminophen Itching and Nausea And Vomiting   Sulfa Antibiotics Itching and Nausea And Vomiting    VACCINATION STATUS: Immunization History  Administered Date(s) Administered   Fluad Quad(high Dose 65+) 05/20/2019   H1N1 07/15/2008   Influenza Split 06/17/2012   Influenza Whole 07/03/2007, 06/17/2009, 06/07/2011   Influenza, High Dose Seasonal PF 07/17/2018   Influenza,inj,Quad PF,6+ Mos 07/13/2013, 09/06/2014, 08/10/2015, 05/23/2016, 05/22/2017   Pneumococcal Conjugate-13 05/03/2014    Pneumococcal Polysaccharide-23 09/08/2010   Td 09/08/2010   Tdap 07/17/2018   Unspecified SARS-COV-2 Vaccination 09/25/2019, 10/26/2019    Diabetes She presents for her follow-up diabetic visit. She has type 2 diabetes mellitus. Onset time: poor historian- was diagnosed in her early 13s. Her disease course has been improving. There are no hypoglycemic associated symptoms. (Says she has symptoms of AMS when glucose drops into the low 90s) Pertinent negatives for diabetes include no blurred vision, no fatigue, no polydipsia and no polyuria. There are no hypoglycemic complications. Symptoms are improving. Diabetic complications include a CVA, nephropathy, peripheral neuropathy and PVD. [facility] daughter reports CVA in past) Risk factors for coronary artery disease include diabetes mellitus, dyslipidemia, family history, hypertension, obesity, post-menopausal and sedentary lifestyle. Current diabetic treatment includes insulin injections. She is compliant with treatment most of the time. Her weight is fluctuating minimally. She is following a generally unhealthy diet. When asked about meal planning, she reported none. She has not had a previous visit with a dietitian. She rarely participates in exercise. Her home blood glucose trend is decreasing steadily. Her overall blood glucose range is 140-180 mg/dl. (She presents today, accompanied by her daughter, with her meter, no logs, showing improving and more stable glycemic profile.  She was not due for another A1c today.  Analysis of her meter shows 7-day average of 155, 14-day average of 138, 30-day average of 150, and 90-day average of 196.  She has had less episodes of hypoglycemia since switching to ultra long acting insulin once daily.) An ACE inhibitor/angiotensin II receptor blocker is being taken. She  sees a podiatrist.Eye exam is current.  Hypertension This is a chronic problem. The current episode started more than 1 year ago. The problem has been  resolved since onset. The problem is controlled. Pertinent negatives include no blurred vision. There are no associated agents to hypertension. Risk factors for coronary artery disease include diabetes mellitus, dyslipidemia, family history, obesity, post-menopausal state and sedentary lifestyle. Past treatments include calcium channel blockers and ACE inhibitors. The current treatment provides moderate improvement. Compliance problems include diet and exercise.  Hypertensive end-organ damage includes kidney disease, CVA and PVD. Identifiable causes of hypertension include chronic renal disease and sleep apnea.  Hyperlipidemia This is a chronic problem. The current episode started more than 1 year ago. The problem is controlled. Recent lipid tests were reviewed and are normal. Exacerbating diseases include chronic renal disease, diabetes and obesity. There are no known factors aggravating her hyperlipidemia. Current antihyperlipidemic treatment includes statins. The current treatment provides moderate improvement of lipids. Compliance problems include adherence to diet and adherence to exercise.  Risk factors for coronary artery disease include diabetes mellitus, dyslipidemia, family history, hypertension, obesity, a sedentary lifestyle and post-menopausal.    Review of systems  Constitutional: + Minimally fluctuating body weight, current Body mass index is 39.08 kg/m., + fatigue, no subjective hyperthermia, no subjective hypothermia Eyes: no blurry vision, no xerophthalmia ENT: no sore throat, no nodules palpated in throat, no dysphagia/odynophagia, no hoarseness Cardiovascular: no chest pain, no shortness of breath, no palpitations, + intermittent leg swelling Respiratory: no cough, no shortness of breath Gastrointestinal: no nausea/vomiting/diarrhea Genitourinary: + polyuria and incontinence Musculoskeletal: Hx chronic pain; walks with walker Skin: no rashes, no hyperemia Neurological: no  tremors, no numbness, no tingling, no dizziness Psychiatric: no depression, no anxiety  Objective:     BP (!) 109/55   Pulse 80   Ht 5' 8"  (1.727 m)   Wt 257 lb (116.6 kg)   BMI 39.08 kg/m   Wt Readings from Last 3 Encounters:  08/09/21 257 lb (116.6 kg)  08/08/21 256 lb 12.8 oz (116.5 kg)  07/13/21 257 lb 0.9 oz (116.6 kg)     BP Readings from Last 3 Encounters:  08/09/21 (!) 109/55  08/08/21 (!) 159/76  07/26/21 (!) 152/69     Physical Exam- Limited  Constitutional:  Body mass index is 39.08 kg/m. , not in acute distress, normal state of mind Eyes:  EOMI, no exophthalmos Neck: Supple Cardiovascular: irregular rhythm, no murmurs, rubs, or gallops, + nonpitting edema to BLE Respiratory: Adequate breathing efforts, no crackles, rales, rhonchi, or wheezing Musculoskeletal: no gross deformities, strength intact in all four extremities, no gross restriction of joint movements, walks with walker Skin:  no rashes, no hyperemia Neurological: no tremor with outstretched hands    CMP ( most recent) CMP     Component Value Date/Time   NA 140 04/07/2021 1030   K 4.8 04/07/2021 1030   CL 102 04/07/2021 1030   CO2 23 04/07/2021 1030   GLUCOSE 195 (H) 04/07/2021 1030   GLUCOSE 145 (H) 06/03/2020 0210   BUN 36 (H) 04/07/2021 1030   CREATININE 1.55 (H) 04/07/2021 1030   CREATININE 1.30 (H) 12/01/2019 1137   CALCIUM 9.9 04/07/2021 1030   PROT 7.3 04/07/2021 1030   ALBUMIN 4.4 04/07/2021 1030   AST 14 04/07/2021 1030   ALT 16 04/07/2021 1030   ALKPHOS 137 (H) 04/07/2021 1030   BILITOT 0.2 04/07/2021 1030   GFRNONAA 47 (L) 06/03/2020 0210   GFRNONAA 40 (L) 12/01/2019 1137  GFRAA 55 (L) 06/03/2020 0210   GFRAA 46 (L) 12/01/2019 1137     Diabetic Labs (most recent): Lab Results  Component Value Date   HGBA1C 10.0 (H) 04/07/2021   HGBA1C 9.5 (A) 11/17/2020   HGBA1C 8.3 (A) 06/30/2020     Lipid Panel ( most recent) Lipid Panel     Component Value Date/Time    CHOL 144 04/07/2021 1030   TRIG 111 04/07/2021 1030   HDL 58 04/07/2021 1030   CHOLHDL 2.6 12/01/2019 1137   VLDL 16 05/14/2017 1039   LDLCALC 66 04/07/2021 1030   LDLCALC 72 12/01/2019 1137   LABVLDL 20 04/07/2021 1030      Lab Results  Component Value Date   TSH 1.129 05/27/2020   TSH 2.24 12/01/2019   TSH 1.12 11/26/2018   TSH 2.01 05/14/2017   TSH 2.13 02/21/2016   TSH 0.989 08/18/2014   TSH 0.520 01/23/2012   TSH 1.315 09/08/2010   TSH 1.674 05/16/2010   TSH 1.184 03/10/2009           Assessment & Plan:   1) Uncontrolled Type 2 Diabetes with CKD stage 3b-  She presents today, accompanied by her daughter, with her meter, no logs, showing improving and more stable glycemic profile.  She was not due for another A1c today.  Analysis of her meter shows 7-day average of 155, 14-day average of 138, 30-day average of 150, and 90-day average of 196.  She has had less episodes of hypoglycemia since switching to ultra long acting insulin once daily.  - WILLODENE STALLINGS has currently uncontrolled symptomatic type 2 DM since 80 years of age, with most recent A1c of 9.8 %.   -Recent labs reviewed.  She has seen Dr. Theador Hawthorne in the past for CKD.  - I had a long discussion with her about the progressive nature of diabetes and the pathology behind its complications.  -her diabetes is complicated by CKD, CVA, neuropathy, PVD and she remains at a high risk for more acute and chronic complications which include CAD, CVA, CKD, retinopathy, and neuropathy. These are all discussed in detail with her.  - Nutritional counseling repeated at each appointment due to patients tendency to fall back in to old habits.  - The patient admits there is a room for improvement in their diet and drink choices. -  Suggestion is made for the patient to avoid simple carbohydrates from their diet including Cakes, Sweet Desserts / Pastries, Ice Cream, Soda (diet and regular), Sweet Tea, Candies, Chips, Cookies,  Sweet Pastries, Store Bought Juices, Alcohol in Excess of 1-2 drinks a day, Artificial Sweeteners, Coffee Creamer, and "Sugar-free" Products. This will help patient to have stable blood glucose profile and potentially avoid unintended weight gain.   - I encouraged the patient to switch to unprocessed or minimally processed complex starch and increased protein intake (animal or plant source), fruits, and vegetables.   - Patient is advised to stick to a routine mealtimes to eat 3 meals a day and avoid unnecessary snacks (to snack only to correct hypoglycemia).  - I have approached her with the following individualized plan to manage her diabetes and patient agrees:   -Given her age and comorbidities, avoiding hypoglycemia will be the number 1 priority in her diabetes management.  An A1c of 7-7.5 will be acceptable for her.  Adjustments need to be made slowly in her case to avoid inadvertent hypoglycemia.  -She has done well with switching to ultra-long acting insulin once daily.  She is advised to continue Tresiba 14 units SQ nightly.    -she is encouraged to continue monitoring blood glucose twice daily, before breakfast and before bed, and to call the clinic if she has readings less than 80 or above 300 for 3 tests in a row.  She could benefit from CGM device such as Dexcom given her tendency to have random drops in glucose and lack of 24/7 supervision in the home.  Insurance will not cover this due to only being on 1 injection per day but family is prepared to pay for it out of pocket.  Sent in Rx to [facility] today.  - she is warned not to take insulin without proper monitoring per orders. - Adjustment parameters are given to her for hypo and hyperglycemia in writing.  - she is not a candidate for Metformin or SGLT2i due to concurrent renal insufficiency.  - she will be considered for incretin therapy as appropriate next visit.  - Specific targets for  A1c; LDL, HDL, and Triglycerides were  discussed with the patient.  2) Blood Pressure /Hypertension:  her blood pressure is controlled to target.   she is advised to continue her current medications including Norvasc 5 mg p.o. daily with breakfast, Benazepril 20 mg po daily and Torsemide 40 mg and 60 mg daily on alternating days.  3) Lipids/Hyperlipidemia:    Review of her recent lipid panel from 04/07/21 showed controlled LDL at 66 .  she is advised to continue Lipitor 40 mg daily at bedtime.  Side effects and precautions discussed with her.  4)  Weight/Diet:  her Body mass index is 39.08 kg/m.  -  clearly complicating her diabetes care.   she is a candidate for weight loss. I discussed with her the fact that loss of 5 - 10% of her  current body weight will have the most impact on her diabetes management.  Exercise, and detailed carbohydrates information provided  -  detailed on discharge instructions.  5) Chronic Care/Health Maintenance: -she is on ACEI/ARB and Statin medications and is encouraged to initiate and continue to follow up with Ophthalmology, Dentist, Podiatrist at least yearly or according to recommendations, and advised to stay away from smoking. I have recommended yearly flu vaccine and pneumonia vaccine at least every 5 years; moderate intensity exercise for up to 150 minutes weekly; and sleep for at least 7 hours a day.  - she is advised to maintain close follow up with Fayrene Helper, MD for primary care needs, as well as her other providers for optimal and coordinated care.     I spent 40 minutes in the care of the patient today including review of labs from [facility], Lipids, Thyroid Function, Hematology (current and previous including abstractions from other facilities); face-to-face time discussing  her blood glucose readings/logs, discussing hypoglycemia and hyperglycemia episodes and symptoms, medications doses, her options of short and long term treatment based on the latest standards of care / guidelines;   discussion about incorporating lifestyle medicine;  and documenting the encounter.    Please refer to Patient Instructions for Blood Glucose Monitoring and Insulin/Medications Dosing Guide"  in media tab for additional information. Please  also refer to " Patient Self Inventory" in the Media  tab for reviewed elements of pertinent patient history.  [name] participated in the discussions, expressed understanding, and voiced agreement with the above plans.  All questions were answered to her satisfaction. she is encouraged to contact clinic should she have any questions  or concerns prior to her return visit.     Follow up plan: - Return in about 4 months (around 12/07/2021) for Diabetes F/U with A1c in office, No previsit labs, Bring meter and logs.    Rayetta Pigg, [facility] [address] [place], [place] [address] Phone: [phone] Fax: [phone]  08/09/2021, 3:28 PM

## 2021-08-09 NOTE — Patient Instructions (Signed)

## 2021-08-14 ENCOUNTER — Other Ambulatory Visit: Payer: Self-pay | Admitting: Family Medicine

## 2021-08-14 ENCOUNTER — Other Ambulatory Visit (HOSPITAL_COMMUNITY): Payer: Self-pay | Admitting: Psychiatry

## 2021-08-23 ENCOUNTER — Telehealth: Payer: Medicare HMO

## 2021-08-23 ENCOUNTER — Telehealth: Payer: Self-pay | Admitting: Pharmacist

## 2021-08-23 DIAGNOSIS — E785 Hyperlipidemia, unspecified: Secondary | ICD-10-CM | POA: Diagnosis not present

## 2021-08-23 DIAGNOSIS — I10 Essential (primary) hypertension: Secondary | ICD-10-CM | POA: Diagnosis not present

## 2021-08-23 DIAGNOSIS — Z794 Long term (current) use of insulin: Secondary | ICD-10-CM | POA: Diagnosis not present

## 2021-08-23 DIAGNOSIS — N184 Chronic kidney disease, stage 4 (severe): Secondary | ICD-10-CM | POA: Diagnosis not present

## 2021-08-23 DIAGNOSIS — E1169 Type 2 diabetes mellitus with other specified complication: Secondary | ICD-10-CM

## 2021-08-23 DIAGNOSIS — E1165 Type 2 diabetes mellitus with hyperglycemia: Secondary | ICD-10-CM

## 2021-08-23 NOTE — Telephone Encounter (Signed)
  Care Management   Follow Up Note  08/23/2021 Name: Laura Atkins MRN: [id number] DOB: [birth year]  Referred by: Fayrene Helper, MD Reason for referral : Chronic Care Management (Unsuccessful telephone outreach)  An unsuccessful telephone outreach was attempted today. The patient was referred to the case management team for assistance with care management and care coordination.   Follow Up Plan: The care management team will reach out to the patient again over the next 7 days.   Kennon Holter, PharmD, [facility] Clinical Pharmacist [facility] [phone]

## 2021-08-24 ENCOUNTER — Telehealth: Payer: Self-pay | Admitting: *Deleted

## 2021-08-24 DIAGNOSIS — I129 Hypertensive chronic kidney disease with stage 1 through stage 4 chronic kidney disease, or unspecified chronic kidney disease: Secondary | ICD-10-CM | POA: Diagnosis not present

## 2021-08-24 DIAGNOSIS — D508 Other iron deficiency anemias: Secondary | ICD-10-CM | POA: Diagnosis not present

## 2021-08-24 DIAGNOSIS — R809 Proteinuria, unspecified: Secondary | ICD-10-CM | POA: Diagnosis not present

## 2021-08-24 DIAGNOSIS — E1122 Type 2 diabetes mellitus with diabetic chronic kidney disease: Secondary | ICD-10-CM | POA: Diagnosis not present

## 2021-08-24 DIAGNOSIS — N189 Chronic kidney disease, unspecified: Secondary | ICD-10-CM | POA: Diagnosis not present

## 2021-08-24 NOTE — Chronic Care Management (AMB) (Signed)
  Care Management   Note  08/24/2021 Name: NIKISHA FLEECE MRN: [id number] DOB: [birth year]  LATRONDA SPINK is a 80 y.o. year old female who is a primary care patient of Fayrene Helper, MD and is actively engaged with the care management team. I reached out to [name] by phone today to assist with re-scheduling a follow up visit with the Pharmacist  Follow up plan: Unsuccessful telephone outreach attempt made. A HIPAA compliant phone message was left for the patient providing contact information and requesting a return call.  The care management team will reach out to the patient again over the next 7 days.  If patient returns call to provider office, please advise to call [facility] at [phone].  [facility] Management  Direct Dial: [phone]

## 2021-08-25 DIAGNOSIS — D508 Other iron deficiency anemias: Secondary | ICD-10-CM | POA: Diagnosis not present

## 2021-08-25 DIAGNOSIS — I129 Hypertensive chronic kidney disease with stage 1 through stage 4 chronic kidney disease, or unspecified chronic kidney disease: Secondary | ICD-10-CM | POA: Diagnosis not present

## 2021-08-25 DIAGNOSIS — R809 Proteinuria, unspecified: Secondary | ICD-10-CM | POA: Diagnosis not present

## 2021-08-25 DIAGNOSIS — D638 Anemia in other chronic diseases classified elsewhere: Secondary | ICD-10-CM | POA: Diagnosis not present

## 2021-08-25 DIAGNOSIS — E1122 Type 2 diabetes mellitus with diabetic chronic kidney disease: Secondary | ICD-10-CM | POA: Diagnosis not present

## 2021-08-25 DIAGNOSIS — N189 Chronic kidney disease, unspecified: Secondary | ICD-10-CM | POA: Diagnosis not present

## 2021-08-25 DIAGNOSIS — Z6839 Body mass index (BMI) 39.0-39.9, adult: Secondary | ICD-10-CM | POA: Diagnosis not present

## 2021-08-31 NOTE — Chronic Care Management (AMB) (Signed)
  Care Management   Note  08/31/2021 Name: ANALISSA BAYLESS MRN: [id number] DOB: 07/30/1941  ELONNA MCFARLANE is a 80 y.o. year old female who is a primary care patient of Fayrene Helper, MD and is actively engaged with the care management team. I reached out to [name] by phone today to assist with re-scheduling a follow up visit with the Pharmacist  Follow up plan: Unsuccessful telephone outreach attempt made. A HIPAA compliant phone message was left for the patient providing contact information and requesting a return call.  The care management team will reach out to the patient again over the next 7 days.  If patient returns call to provider office, please advise to call [facility] Management  Direct Dial: [phone]

## 2021-09-05 ENCOUNTER — Other Ambulatory Visit: Payer: Self-pay

## 2021-09-05 ENCOUNTER — Encounter: Payer: Self-pay | Admitting: Urology

## 2021-09-05 ENCOUNTER — Ambulatory Visit (INDEPENDENT_AMBULATORY_CARE_PROVIDER_SITE_OTHER): Payer: Medicare HMO | Admitting: Urology

## 2021-09-05 VITALS — BP 154/81 | HR 91

## 2021-09-05 DIAGNOSIS — Z87440 Personal history of urinary (tract) infections: Secondary | ICD-10-CM

## 2021-09-05 DIAGNOSIS — N3941 Urge incontinence: Secondary | ICD-10-CM | POA: Diagnosis not present

## 2021-09-05 MED ORDER — MIRABEGRON ER 50 MG PO TB24: 50.0000 mg | ORAL_TABLET | Freq: Every day | ORAL | 0 refills | Status: DC

## 2021-09-05 NOTE — Progress Notes (Signed)
Assessment: 1. Urge incontinence   2. History of UTI      Plan: Trial of Myrbetriq 50 mg daily.  Samples provided.  Continue methods to reduce the risk of UTIs including increased fluid intake, timed and double voiding, daily cranberry supplement, and daily probiotics discussed. Return to office in 1 month. Rx for Tranquility pads provided  Chief Complaint:  Chief Complaint  Patient presents with   Urinary Incontinence    History of Present Illness:  Laura Atkins is a 80 y.o. year old female who is seen for further evaluation of urinary incontinence and UTI's.  She has had symptoms for 10+ years.  She has urinary frequency, urgency, and urge incontinence.  She is using maxipads twice daily.  No prior medical therapy for her incontinence symptoms.  She does have a history of UTIs. Urine culture results: 10/21: E. coli 1/22: <10K colonies 2/22: <10K colonies 7/22: E. coli 8/25: E. Coli  She typically has symptoms of increased frequency and dysuria associated with UTIs.  No history of gross hematuria.  She does have chronic kidney disease and is followed by nephrology. Renal ultrasound from 05/19/2021 showed no mass or hydronephrosis involving either kidney and a small amount of layering debris in the bladder.  She was given a trial of Myrbetriq 25 mg daily at her visit on 08/08/2021.  She returns today for follow-up.  She has noted some slight improvement in her symptoms.  She continues to have incontinence associated with urgency both daytime and nighttime.  She is using maxipads 2-3 times per day.  No dysuria or gross hematuria.  Portions of the above documentation were copied from a prior visit for review purposes only.  Past Medical History:  Past Medical History:  Diagnosis Date   Alpha thalassemia trait 01/26/2010   02/2012: Nl CBC ex H&H-10.7/34.8, MCV-69    Anemia    Anxiety    Anxiety and depression    Cellulitis 05/09/2017   Depression    Diabetes  mellitus    Foot pain, right 04/30/2013   GERD (gastroesophageal reflux disease)    Headache(784.0)    Hyperlipidemia    Hypertension    Iron deficiency 01/21/2017   Microcytic anemia 01/26/2010   02/2012: Nl CBC ex H&H-10.7/34.8, MCV-69    NECK PAIN, CHRONIC 10/21/2008   +chronic back pain     Obesity    Obstructive sleep apnea    Osteoarthritis    Left knee; right shoulder; chronic neck and back pain   Pruritus    PVD (peripheral vascular disease) ([place] [place]) 01/28/2014   Seizures (HCC)    Shoulder pain, right 04/14/2015   Tremor    This started months ago after her seizure progressing to very poor hand writing   Urinary incontinence    UTI (urinary tract infection) 01/18/2013    Past Surgical History:  Past Surgical History:  Procedure Laterality Date   ABDOMINAL HYSTERECTOMY     BREAST EXCISIONAL BIOPSY     Left; cyst   CATARACT EXTRACTION Right    12/2017   CATARACT EXTRACTION W/ INTRAOCULAR LENS IMPLANT Left 09/07/2013   CHOLECYSTECTOMY     COLONOSCOPY     COLONOSCOPY N/A 07/20/2015   Procedure: COLONOSCOPY;  Surgeon: Rogene Houston, MD;  [facility];  Service: Endoscopy;  Laterality: N/A;  930   EYE SURGERY Left 09/07/2013   cataract    Allergies:  Allergies  Allergen Reactions   Penicillins Shortness Of Breath, Itching and Rash   Prednisone  Shortness Of Breath, Itching and Rash   Propoxyphene N-Acetaminophen Itching and Nausea And Vomiting   Sulfa Antibiotics Itching and Nausea And Vomiting    Family History:  Family History  Problem Relation Age of Onset   Lung cancer Mother 60   Kidney disease Father    Diabetes Sister    Keloids Brother    ADD / ADHD Grandchild    Bipolar disorder Grandchild    Bipolar disorder Daughter    Seizures Daughter    Heart disease Daughter    Kidney disease Son    Neuropathy Son    Kidney disease Son    Edema Daughter    Breast cancer Daughter 53   Allergies Daughter    Alcohol abuse Neg Hx    Drug abuse Neg Hx      Social History:  Social History   Tobacco Use   Smoking status: Never   Smokeless tobacco: Never  Vaping Use   Vaping Use: Never used  Substance Use Topics   Alcohol use: No    Alcohol/week: 0.0 standard drinks   Drug use: No    ROS: Constitutional:  Negative for fever, chills, weight loss CV: Negative for chest pain, previous MI, hypertension Respiratory:  Negative for shortness of breath, wheezing, sleep apnea, frequent cough GI:  Negative for nausea, vomiting, bloody stool, GERD  Physical exam: BP (!) 154/81    Pulse 91  GENERAL APPEARANCE:  Well appearing, well developed, well nourished, NAD HEENT:  Atraumatic, normocephalic, oropharynx clear NECK:  Supple without lymphadenopathy or thyromegaly ABDOMEN:  Soft, non-tender, no masses EXTREMITIES:  Moves all extremities well, without clubbing, cyanosis, or edema NEUROLOGIC:  Alert and oriented x 3, CN II-XII grossly intact MENTAL STATUS:  appropriate BACK:  Non-tender to palpation, No CVAT SKIN:  Warm, dry, and intact   Results: U/A dipstick negative

## 2021-09-05 NOTE — Progress Notes (Signed)
Urological Symptom Review  Patient is experiencing the following symptoms: Frequent urination Urinary tract infection   Review of Systems  Gastrointestinal (upper)  : Negative for upper GI symptoms  Gastrointestinal (lower) : Negative for lower GI symptoms  Constitutional : Negative for symptoms  Skin: Negative for skin symptoms  Eyes: Negative for eye symptoms  Ear/Nose/Throat : Negative for Ear/Nose/Throat symptoms  Hematologic/Lymphatic: Negative for Hematologic/Lymphatic symptoms  Cardiovascular : Negative for cardiovascular symptoms  Respiratory : Shortness of breath  Endocrine: Negative for endocrine symptoms  Musculoskeletal: Joint pain  Neurological: Negative for neurological symptoms  Psychologic: Depression

## 2021-09-06 LAB — URINALYSIS, ROUTINE W REFLEX MICROSCOPIC
Bilirubin, UA: NEGATIVE
Glucose, UA: NEGATIVE
Ketones, UA: NEGATIVE
Leukocytes,UA: NEGATIVE
Nitrite, UA: NEGATIVE
Protein,UA: NEGATIVE
RBC, UA: NEGATIVE
Specific Gravity, UA: 1.015 (ref 1.005–1.030)
Urobilinogen, Ur: 0.2 mg/dL (ref 0.2–1.0)
pH, UA: 5.5 (ref 5.0–7.5)

## 2021-09-07 NOTE — Chronic Care Management (AMB) (Signed)
°  Care Management   Note  09/07/2021 Name: Laura Atkins MRN: [id number] DOB: 07/19/1941  Laura Atkins is a 80 y.o. year old female who is a primary care patient of Fayrene Helper, MD and is actively engaged with the care management team. I reached out to [name] by phone today to assist with re-scheduling a follow up visit with the Pharmacist  Follow up plan: Telephone appointment with care management team member scheduled for:09/20/21  [facility]  Direct Dial: [phone]

## 2021-09-13 ENCOUNTER — Other Ambulatory Visit: Payer: Self-pay | Admitting: Family Medicine

## 2021-09-13 ENCOUNTER — Encounter (HOSPITAL_COMMUNITY): Payer: Self-pay

## 2021-09-13 NOTE — Therapy (Signed)
[facility] [place], [place], [address] Phone: [phone]   Fax:  [phone]  Physical Therapy Treatment  Patient Details  Name: Laura Atkins MRN: [id number] Date of Birth: May 31, 1941 Referring Provider (PT): Tula Nakayama  PHYSICAL THERAPY DISCHARGE SUMMARY  Visits from Start of Care: 10  Current functional level related to goals / functional outcomes: Did not return after last visit   Remaining deficits: Continued deficits and limitations    Education / Equipment: HEP, non-compliant   Patient agrees to discharge. Patient goals were partially met. Patient is being discharged due to not returning since the last visit.  Encounter Date: 09/13/2021    Past Medical History:  Diagnosis Date   Alpha thalassemia trait 01/26/2010   02/2012: Nl CBC ex H&H-10.7/34.8, MCV-69    Anemia    Anxiety    Anxiety and depression    Cellulitis 05/09/2017   Depression    Diabetes mellitus    Foot pain, right 04/30/2013   GERD (gastroesophageal reflux disease)    Headache(784.0)    Hyperlipidemia    Hypertension    Iron deficiency 01/21/2017   Microcytic anemia 01/26/2010   02/2012: Nl CBC ex H&H-10.7/34.8, MCV-69    NECK PAIN, CHRONIC 10/21/2008   +chronic back pain     Obesity    Obstructive sleep apnea    Osteoarthritis    Left knee; right shoulder; chronic neck and back pain   Pruritus    PVD (peripheral vascular disease) ([place] [facility]) 01/28/2014   Seizures (HCC)    Shoulder pain, right 04/14/2015   Tremor    This started months ago after her seizure progressing to very poor hand writing   Urinary incontinence    UTI (urinary tract infection) 01/18/2013    Past Surgical History:  Procedure Laterality Date   ABDOMINAL HYSTERECTOMY     BREAST EXCISIONAL BIOPSY     Left; cyst   CATARACT EXTRACTION Right    12/2017   CATARACT EXTRACTION W/ INTRAOCULAR LENS IMPLANT Left 09/07/2013   CHOLECYSTECTOMY     COLONOSCOPY      COLONOSCOPY N/A 07/20/2015   Procedure: COLONOSCOPY;  Surgeon: Rogene Houston, MD;  [facility];  Service: Endoscopy;  Laterality: N/A;  930   EYE SURGERY Left 09/07/2013   cataract    There were no vitals filed for this visit.                                 PT Short Term Goals - 04/19/21 1006       PT SHORT TERM GOAL #1   Title Patient will independently verbalize and demo initial HEP in order to improve B LE strength with ambulation and transfers.     Baseline poor compliance to date    Time 3    Period Weeks    Status On-going    Target Date 05/03/21      PT SHORT TERM GOAL #2   Title Patient will demo functional transfers with supervision to reduce burden of care    Baseline CGA-min A    Time 3    Period Weeks    Status On-going    Target Date 05/03/21      PT SHORT TERM GOAL #3   Title PT  will be able to ambulate 142f with a RW in 2 minute time period to demonstrate reduce risk of falling.    Baseline  55 ft with supervision using RW    Time 3    Period Weeks    Status On-going    Target Date 03/29/21      PT SHORT TERM GOAL #4   Title Decrease risk for falls per TUG test time of 30 sec    Baseline 59 sec 04/19/21    Time 3    Period Weeks    Status On-going    Target Date 05/03/21               PT Long Term Goals - 04/19/21 1014       PT LONG TERM GOAL #1   Title Demo functional transfers with modified independence to reduce caregiver assistance    Baseline CGA-min A    Time 6    Period Weeks    Status On-going    Target Date 05/03/21      PT LONG TERM GOAL #2   Title Decrease risk for falls per time of 15 sec TUG test    Baseline 59 sec with RW    Time 6    Period Weeks    Status On-going    Target Date 05/03/21      PT LONG TERM GOAL #3   Title Patient will improve FOTO score by at least 10 points in order to indicate improved tolerance to activity.    Baseline 18% function    Time 6    Period  Weeks    Status New                    Patient will benefit from skilled therapeutic intervention in order to improve the following deficits and impairments:     Visit Diagnosis: No diagnosis found.     Problem List Patient Active Problem List   Diagnosis Date Noted   Recurrent UTI 05/21/2021   Acute cystitis with hematuria 04/16/2021   Pressure injury of right heel, unstageable ([place]) 01/06/2021   Polyneuropathy associated with underlying disease ([place] [facility]) 01/06/2021   Chronic kidney disease, stage 4 (severe) ([facility] [facility]) 01/06/2021   Dementia ([facility]) 07/13/2020   Cognitive deficits    Carotid bruit 05/26/2020   Hyperlipidemia associated with type 2 diabetes mellitus ([place]) 12/08/2019   Left knee pain 02/11/2017   Iron deficiency 01/21/2017   Vitamin D deficiency 10/15/2016   Urinary incontinence 10/15/2016   Limited mobility 05/27/2016   Type 2 diabetes mellitus with hyperglycemia ([facility]) 02/04/2016   Sleep terror disorder 12/14/2015   Fecal incontinence 12/13/2015   Back pain of lumbar region with sciatica 04/15/2015   Spondylosis, cervical, with myelopathy 04/14/2015   OSA (obstructive sleep apnea) 04/10/2013   Peripheral neuropathy 03/12/2013   Hearing loss 11/06/2012   Seizure-like activity ([facility]) 01/25/2012   UNSTEADY GAIT 11/07/2010   Alpha thalassemia ([facility]) 01/26/2010   Morbid obesity ([facility]) 04/21/2008   Anxiety and depression 04/21/2008   Essential hypertension 04/21/2008   GERD 04/21/2008   Osteoarthritis of left knee 04/21/2008    Toniann Fail, PT 09/13/2021, 2:06 PM  [facility] [address] [place], [place], [address] Phone: [phone]   Fax:  [phone]  Name: Laura Atkins MRN: [id number] Date of Birth: [birth year]

## 2021-09-14 ENCOUNTER — Other Ambulatory Visit: Payer: Self-pay

## 2021-09-14 ENCOUNTER — Encounter: Payer: Self-pay | Admitting: Family Medicine

## 2021-09-14 ENCOUNTER — Ambulatory Visit (INDEPENDENT_AMBULATORY_CARE_PROVIDER_SITE_OTHER): Payer: Medicare HMO | Admitting: Family Medicine

## 2021-09-14 DIAGNOSIS — J40 Bronchitis, not specified as acute or chronic: Secondary | ICD-10-CM

## 2021-09-14 DIAGNOSIS — J329 Chronic sinusitis, unspecified: Secondary | ICD-10-CM | POA: Diagnosis not present

## 2021-09-14 MED ORDER — BENZONATATE 100 MG PO CAPS: 100.0000 mg | ORAL_CAPSULE | Freq: Two times a day (BID) | ORAL | 0 refills | Status: DC | PRN

## 2021-09-14 MED ORDER — DOXYCYCLINE HYCLATE 100 MG PO TABS: 100.0000 mg | ORAL_TABLET | Freq: Two times a day (BID) | ORAL | 0 refills | Status: DC

## 2021-09-14 NOTE — Progress Notes (Signed)
Virtual Visit via Telephone Note  I connected with Laura Atkins on 09/14/21 at 10:40 AM EST by telephone and verified that I am speaking with the correct person using two identifiers.  Location: Patient: home Provider: office   I discussed the limitations, risks, security and privacy concerns of performing an evaluation and management service by telephone and the availability of in person appointments. I also discussed with the patient that there may be a patient responsible charge related to this service. The patient expressed understanding and agreed to proceed.   History of Present Illness: 1 week h/o head and chest congestion, clear drainage and sputum, no fever, chills or body aches   Observations/Objective: There were no vitals taken for this visit. Good communication with no confusion and intact memory. Alert and oriented x 3 No signs of respiratory distress during speech   Assessment and Plan:  Bronchitis Doxycycline prescribed and decongestant  Sinusitis doxycyline pescribed  Follow Up Instructions:    I discussed the assessment and treatment plan with the patient. The patient was provided an opportunity to ask questions and all were answered. The patient agreed with the plan and demonstrated an understanding of the instructions.   The patient was advised to call back or seek an in-person evaluation if the symptoms worsen or if the condition fails to improve as anticipated.  I provided 9 minutes of non-face-to-face time during this encounter.   Tula Nakayama, MD

## 2021-09-14 NOTE — Patient Instructions (Signed)
Annual exam end January as scheduled, call if you need me sooner  Doxycycline and decongestant are prescribed for sinusitis and bronchitis  Please come in for flu vaccine early January  Thanks for choosing [facility], we consider it a privelige to serve you.

## 2021-09-19 ENCOUNTER — Encounter: Payer: Self-pay | Admitting: Family Medicine

## 2021-09-19 DIAGNOSIS — J329 Chronic sinusitis, unspecified: Secondary | ICD-10-CM | POA: Insufficient documentation

## 2021-09-19 NOTE — Assessment & Plan Note (Signed)
Doxycycline prescribed and decongestant

## 2021-09-19 NOTE — Assessment & Plan Note (Signed)
doxycyline pescribed

## 2021-09-20 ENCOUNTER — Ambulatory Visit (INDEPENDENT_AMBULATORY_CARE_PROVIDER_SITE_OTHER): Payer: Medicare HMO | Admitting: Pharmacist

## 2021-09-20 DIAGNOSIS — Z794 Long term (current) use of insulin: Secondary | ICD-10-CM

## 2021-09-20 DIAGNOSIS — N184 Chronic kidney disease, stage 4 (severe): Secondary | ICD-10-CM

## 2021-09-20 DIAGNOSIS — E785 Hyperlipidemia, unspecified: Secondary | ICD-10-CM

## 2021-09-20 DIAGNOSIS — I10 Essential (primary) hypertension: Secondary | ICD-10-CM

## 2021-09-20 NOTE — Patient Instructions (Signed)
Laura Atkins,  It was great to talk to you today!  Please call me with any questions or concerns.   Visit Information  Following are the goals we discussed today:  Patient Goals/Self-Care Activities Over the next 90 days, patient will:  Focus on medication adherence by keeping up with prescription refills and either using a pill box or reminders to take your medications at the prescribed times Check blood sugar continuously with continuous glucose monitor, document, and provide at future appointments Check blood pressure at least once daily, document, and provide at future appointments  Plan: Telephone follow up appointment with care management team member scheduled for:  11/21/21  Kennon Holter, PharmD, BCACP, CPP Clinical Pharmacist Practitioner [facility] [phone]  Please call the care guide team at [phone] if you need to cancel or reschedule your appointment.   The patient verbalized understanding of instructions, educational materials, and care plan provided today and declined offer to receive copy of patient instructions, educational materials, and care plan.

## 2021-09-20 NOTE — Chronic Care Management (AMB) (Signed)
Chronic Care Management Pharmacy Note  09/20/2021 Name:  Laura Atkins MRN:  [id number] DOB:  1940-11-12  Summary: General: Patient continuing antibiotic and cough medication per PCP. Starting to feel a little better now.   Type 2 Diabetes (Follows with Ms Marinus Maw @ endocrinology): Uncontrolled; Most recent A1c 10.0%. Given patient's other comorbidities, A1c goal 7-7.5% is reasonable Current medications: insulin degludec Tyler Aas) 14 units subcutaneously once daily No metformin given renal insuffiencey. Avoid SGLT2 inhibitors given recurrent UTIs Daughter [name] informed today that prescription for Dexcom G6 was sent to [facility] in [place]. She was unaware of this but will call them and get this week. She has experience with using Dexcom and plans to place on the patient and use to monitor her blood glucose continuously.  Continue current insulin regimen per endocrinology  Subjective: Laura Atkins is an 80 y.o. year old female who is a primary patient of Fayrene Helper, MD.  The CCM team was consulted for assistance with disease management and care coordination needs.    Engaged with patient's caregiver (daughter [name]) by telephone for follow up visit in response to provider referral for pharmacy case management and/or care coordination services.   Consent to Services:  The patient was given information about Chronic Care Management services, agreed to services, and gave verbal consent prior to initiation of services.  Please see initial visit note for detailed documentation.   Patient Care Team: Fayrene Helper, MD as PCP - General Branch, Alphonse Guild, MD as PCP - Cardiology (Cardiology) Marcial Pacas, MD as Consulting Physician (Neurology) Renato Shin, MD as Consulting Physician (Endocrinology) Cloria Spring, MD as Consulting Physician ([place]) Alonza Smoker, LCSW as Social Worker (Psychiatry) Clent Jacks, MD as Consulting Physician  (Ophthalmology) Beryle Lathe, [facility] (Pharmacist)  Objective:  Lab Results  Component Value Date   CREATININE 1.55 (H) 04/07/2021   CREATININE 1.13 (H) 06/03/2020   CREATININE 1.17 (H) 06/02/2020    Lab Results  Component Value Date   HGBA1C 10.0 (H) 04/07/2021   Last diabetic Eye exam:  Lab Results  Component Value Date/Time   HMDIABEYEEXA Retinopathy (A) 02/15/2021 12:00 AM    Last diabetic Foot exam: No results found for: HMDIABFOOTEX      Component Value Date/Time   CHOL 144 04/07/2021 1030   TRIG 111 04/07/2021 1030   HDL 58 04/07/2021 1030   CHOLHDL 2.6 12/01/2019 1137   VLDL 16 05/14/2017 1039   LDLCALC 66 04/07/2021 1030   LDLCALC 72 12/01/2019 1137    Hepatic Function Latest Ref Rng & Units 04/07/2021 06/03/2020 05/28/2020  Total Protein 6.0 - 8.5 g/dL 7.3 7.1 7.0  Albumin 3.7 - 4.7 g/dL 4.4 3.6 3.6  AST 0 - 40 IU/L 14 35 24  ALT 0 - 32 IU/L 16 38 23  Alk Phosphatase 44 - 121 IU/L 137(H) 76 78  Total Bilirubin 0.0 - 1.2 mg/dL 0.2 0.5 0.4  Bilirubin, Direct 0.0 - 0.3 mg/dL - - -    Lab Results  Component Value Date/Time   TSH 1.129 05/27/2020 06:28 PM   TSH 2.24 12/01/2019 11:37 AM   TSH 1.12 11/26/2018 09:45 AM    CBC Latest Ref Rng & Units 04/07/2021 06/03/2020 06/02/2020  WBC 3.4 - 10.8 x10E3/uL 6.1 6.3 6.6  Hemoglobin 11.1 - 15.9 g/dL 8.5(L) 9.6(L) 10.2(L)  Hematocrit 34.0 - 46.6 % 28.3(L) 33.7(L) 35.0(L)  Platelets 150 - 450 x10E3/uL 225 170 168    Lab Results  Component  Value Date/Time   VD25OH 38 12/01/2019 11:37 AM   VD25OH 15 (L) 11/26/2018 09:45 AM    Clinical ASCVD: No  The 10-year ASCVD risk score (Arnett DK, et al., 2019) is: 35.8%   Values used to calculate the score:     Age: 80 years     Sex: Female     Is Non-Hispanic African American: Yes     Diabetic: Yes     Tobacco smoker: No     Systolic Blood Pressure: 229 mmHg     Is BP treated: Yes     HDL Cholesterol: 58 mg/dL     Total Cholesterol: 144 mg/dL    Social  History   Tobacco Use  Smoking Status Never  Smokeless Tobacco Never   BP Readings from Last 3 Encounters:  09/05/21 (!) 154/81  08/09/21 (!) 109/55  08/08/21 (!) 159/76   Pulse Readings from Last 3 Encounters:  09/05/21 91  08/09/21 80  08/08/21 76   Wt Readings from Last 3 Encounters:  08/09/21 257 lb (116.6 kg)  08/08/21 256 lb 12.8 oz (116.5 kg)  07/13/21 257 lb 0.9 oz (116.6 kg)    Assessment: Review of patient past medical history, allergies, medications, health status, including review of consultants reports, laboratory and other test data, was performed as part of comprehensive evaluation and provision of chronic care management services.   SDOH:  (Social Determinants of Health) assessments and interventions performed:    CCM Care Plan  Allergies  Allergen Reactions   Penicillins Shortness Of Breath, Itching and Rash   Prednisone Shortness Of Breath, Itching and Rash   Propoxyphene N-Acetaminophen Itching and Nausea And Vomiting   Sulfa Antibiotics Itching and Nausea And Vomiting    Medications Reviewed Today     Reviewed by Beryle Lathe, [facility] (Pharmacist) on 09/20/21 at 1334  Med List Status: <None>   Medication Order Taking? Sig Documenting Provider Last Dose Status Informant  Accu-Chek FastClix Lancets MISC [id number]  1 each by Does not apply route 4 (four) times daily. Use to monitor glucose levels 4 times daily; E11.65 Renato Shin, MD  Active Family Member  ACCU-CHEK GUIDE test strip [id number]  USE TO TEST BLOOD SUGAR FOUR TIMES DAILY Renato Shin, MD  Active   Alcohol Swabs (B-D SINGLE USE SWABS REGULAR) PADS [id number]  1 each by Does not apply route 4 (four) times daily. Use to prep site for glucose monitoring 4 times daily; E11.65 Renato Shin, MD  Active Family Member  amLODipine (NORVASC) 5 MG tablet [id number] Yes TAKE 1 TABLET BY MOUTH EVERY DAY Fayrene Helper, MD Taking Active   aspirin 81 MG tablet [id number] Yes Take 81 mg by mouth  daily. [provider] Taking Active Family Member  atorvastatin (LIPITOR) 40 MG tablet [id number] Yes TAKE 1 TABLET BY MOUTH EVERY DAY Fayrene Helper, MD Taking Active   benazepril (LOTENSIN) 20 MG tablet [id number] Yes TAKE 1 TABLET BY MOUTH EVERY DAY Fayrene Helper, MD Taking Active   benzonatate (TESSALON) 100 MG capsule [id number] Yes Take 1 capsule (100 mg total) by mouth 2 (two) times daily as needed for cough. Fayrene Helper, MD Taking Active   Blood Glucose Monitoring Suppl (ACCU-CHEK GUIDE ME) w/Device KIT [id number]  1 each by Does not apply route 4 (four) times daily. Use to monitor glucose levels 4 times daily; E11.65 Renato Shin, MD  Active Family Member  buPROPion (WELLBUTRIN XL) 150 MG 24 hr tablet [id number] Yes TAKE 1 TABLET BY MOUTH  EVERY MORNING Cloria Spring, MD Taking Active   Cholecalciferol 125 MCG (5000 UT) TABS [id number] Yes Take 1 tablet by mouth daily. [provider] Taking Active Family Member  Continuous Blood Gluc Receiver Benjie Karvonen G6 RECEIVER) DEVI [id number]  Use to monitor glucose at least twice daily Brita Romp, NP  Active   Continuous Blood Gluc Sensor (DEXCOM G6 SENSOR) MISC [id number]  Change sensor every 10 days as directed Brita Romp, NP  Active   Continuous Blood Gluc Transmit (DEXCOM G6 TRANSMITTER) MISC [id number]  Change transmitter every 90 days as directed. Brita Romp, NP  Active   diphenhydrAMINE (BENADRYL) 25 MG tablet [id number] No Take 25 mg by mouth at bedtime. [provider] Unknown Active Family Member  donepezil (ARICEPT) 5 MG tablet [id number] Yes TAKE 1 TABLET BY MOUTH AT BEDTIME Fayrene Helper, MD Taking Active   doxycycline (VIBRA-TABS) 100 MG tablet [id number] Yes Take 1 tablet (100 mg total) by mouth 2 (two) times daily. Fayrene Helper, MD Taking Active   ferrous sulfate 325 (65 FE) MG EC tablet [id number] Yes Take 1 tablet by mouth 2 (two) times daily with a meal. [provider] Taking Active   gabapentin (NEURONTIN) 400 MG capsule [id number] Yes TAKE ONE CAPSULE BY MOUTH EVERY MORNING and TAKE ONE CAPSULE AT NOON and TAKE TWO CAPSULES AT BEDTIME Fayrene Helper, MD Taking Active   insulin degludec (TRESIBA FLEXTOUCH) 200 UNIT/ML FlexTouch Pen [id number] Yes Inject 14 Units into the skin at bedtime. Brita Romp, NP Taking Active   Insulin Pen Needle (PEN NEEDLES) 31G X 5 MM MISC [id number]  Use to inject insulin once daily Brita Romp, NP  Active   Insulin Syringe-Needle U-100 (INSULIN SYRINGE 1CC/31GX5/16") 31G X 5/16" 1 ML MISC [id number]  1 each by Does not apply route daily. Use to inject insulin daily; E11.65 Renato Shin, MD  Active Family Member  mirabegron [facility]) 50 MG TB24 tablet [id number] Yes Take 1 tablet (50 mg total) by mouth daily. [place], Reece Leader., MD Taking Active   Multiple Vitamin (MULTIVITAMIN) capsule [id number] Yes Take 1 capsule by mouth daily. [provider] Taking Active   omeprazole (PRILOSEC) 20 MG capsule [id number] Yes TAKE ONE CAPSULE BY MOUTH EVERY DAY Fayrene Helper, MD Taking Active   potassium chloride SA (KLOR-CON) 20 MEQ tablet [id number] Yes TAKE 1 TABLET BY MOUTH TWICE DAILY Fayrene Helper, MD Taking Active   sertraline (ZOLOFT) 100 MG tablet [id number] Yes TAKE 1 TABLET BY MOUTH EVERY DAY Cloria Spring, MD Taking Active   tiZANidine (ZANAFLEX) 2 MG tablet [id number] No Take 1 tablet (2 mg total) by mouth every 6 (six) hours as needed for muscle spasms.  Patient not taking: Reported on 09/14/2021   Scot Jun, FNP Not Taking Active   torsemide (DEMADEX) 20 MG tablet [id number] Yes TAKE 40 MG EVERY OTHER DAY ALTERNATING 60 MG EVERY OTHER DAY Arnoldo Lenis, MD Taking Active   UNABLE TO FIND [id number]  Walker x 1  DX unsteady gait, osteoarthritis of left knee Fayrene Helper, MD  Active   UNABLE TO FIND [id number]  Elevated [place] Seat x 1 DX: unsteady gait, back  pain, osteoarthritis of left knee Fayrene Helper, MD  Active   UNABLE TO FIND [id number]  Standing upright walker x 1  DX M54.40, M19.90 Fayrene Helper, MD  Active   UNABLE TO FIND [id number]  Incontinence pads and supplies Noreene Larsson, NP  Active   UNABLE TO FIND [id number]  Rollator Dalene Seltzer, MD  Active   UNABLE TO FIND [phone]  XL Tranquility Briefs for incontinence. Fayrene Helper, MD  Active   Med List Note Perlie Mayo, NP 02/06/19 1038):                Patient Active Problem List   Diagnosis Date Noted   Sinusitis 09/19/2021   Recurrent UTI 05/21/2021   Pressure injury of right heel, unstageable ([place]) 01/06/2021   Polyneuropathy associated with underlying disease ([place]) 01/06/2021   Chronic kidney disease, stage 4 (severe) ([place]) 01/06/2021   Dementia ([place]) 07/13/2020   Cognitive deficits    Carotid bruit 05/26/2020   Hyperlipidemia associated with type 2 diabetes mellitus ([place]) 12/08/2019   Left knee pain 02/11/2017   Iron deficiency 01/21/2017   Vitamin D deficiency 10/15/2016   Urinary incontinence 10/15/2016   Limited mobility 05/27/2016   Type 2 diabetes mellitus with hyperglycemia ([place]) 02/04/2016   Sleep terror disorder 12/14/2015   Fecal incontinence 12/13/2015   Back pain of lumbar region with sciatica 04/15/2015   Spondylosis, cervical, with myelopathy 04/14/2015   OSA (obstructive sleep apnea) 04/10/2013   Peripheral neuropathy 03/12/2013   Hearing loss 11/06/2012   Seizure-like activity ([place]) 01/25/2012   Bronchitis 10/11/2011   UNSTEADY GAIT 11/07/2010   Alpha thalassemia ([place]) 01/26/2010   Morbid obesity ([place]) 04/21/2008   Anxiety and depression 04/21/2008   Essential hypertension 04/21/2008   GERD 04/21/2008   Osteoarthritis of left knee 04/21/2008    Immunization History  Administered Date(s) Administered   Fluad Quad(high Dose 65+) 05/20/2019   H1N1 07/15/2008   Influenza Split 06/17/2012   Influenza  Whole 07/03/2007, 06/17/2009, 06/07/2011   Influenza, High Dose Seasonal PF 07/17/2018   Influenza,inj,Quad PF,6+ Mos 07/13/2013, 09/06/2014, 08/10/2015, 05/23/2016, 05/22/2017   Pneumococcal Conjugate-13 05/03/2014   Pneumococcal Polysaccharide-23 09/08/2010   Td 09/08/2010   Tdap 07/17/2018   Unspecified SARS-COV-2 Vaccination 09/25/2019, 10/26/2019    Conditions to be addressed/monitored: HTN, HLD, DMII, and CKD Stage 3b  Care Plan : Medication Management  Updates made by Beryle Lathe, [place] since 09/20/2021 12:00 AM     Problem: HTN, T2DM, CKD, HLD   Priority: High  Onset Date: 06/01/2021     Long-Range Goal: Disease Progression Prevention   Start Date: 06/01/2021  Expected End Date: 08/30/2021  Recent Progress: On track  Priority: High  Note:   Current Barriers:  Unable to independently monitor therapeutic efficacy Unable to achieve control of diabetes  Pharmacist Clinical Goal(s):  Over the next 90 days, patient will Achieve adherence to monitoring guidelines and medication adherence to achieve therapeutic efficacy Achieve control of diabetes as evidenced by improved fasting blood sugar and improved A1c through collaboration with PharmD and provider.   Interventions: 1:1 collaboration with Fayrene Helper, MD regarding development and update of comprehensive plan of care as evidenced by provider attestation and co-signature Inter-disciplinary care team collaboration (see longitudinal plan of care) Comprehensive medication review performed; medication list updated in electronic medical record  Type 2 Diabetes (Follows with Ms Marinus Maw @ endocrinology): Uncontrolled; Most recent A1c 10.0%. Given patient's other comorbidities, A1c goal 7-7.5% is reasonable Current medications: insulin degludec Tyler Aas) 14 units subcutaneously once daily No metformin given renal insuffiencey. Avoid SGLT2 inhibitors given recurrent UTIs Intolerances: none Taking medications as  directed: yes Side effects thought to be attributed to current medication regimen: no Denies hypoglycemic symptoms (sweaty and shaky). Hypoglycemia prevention: not indicated at  this time Current meal patterns: not discussed today Current exercise:  very limited On a statin: yes On aspirin 81 mg daily: yes Last microalbumin: 214 (04/07/21); on an ACEi/ARB: yes Current glucose readings:  not discussed today The following education was provided: Introduction to diabetes basic facts Medication: Identify diabetes medication and when best taken Monitoring: target blood sugar range, when to test Hypoglycemia: I have discussed with the patient how to treat hypoglycemia by the rule of 15; eat/drink 15g of sugar in the form of glucose tabs, 4 ounces of juice or soda and recheck fingerstick glucose in 15 minutes. Chocolate bars and ice cream should be avoided because fat delays carbohydrate digestion and absorption. Retreat if glucose remains low. Driving should cease until glucose is normal. Instructed to monitor blood sugars continuously with continuous glucose monitor Patient identified as a good candidate for a GLP-1 receptor agonist given reduction in cardiovascular disease, slowed chronic kidney disease progression, low risk of hypoglycemia, increased satiety, and weight loss. May be able to improve blood glucose and decrease insulin requirements with a low risk of hypoglycemia. Will defer to endocrinology.  Daughter [name] informed today that prescription for Dexcom G6 was sent to [facility] in [place]. She was unaware of this but will call them and get this week. She has experience with using Dexcom and plans to place on the patient and use to monitor her blood glucose continuously.  Continue current insulin regimen per endocrinology  Hypertension: Blood pressure under good control. Blood pressure is at goal of <130/80 mmHg per 2017 AHA/ACC guidelines. Current medications: benazepril 20 mg by mouth  once daily and amlodipine 5 mg by mouth once daily Intolerances: none Taking medications as directed: yes Side effects thought to be attributed to current medication regimen: no Current home blood pressure: not discussed today Continue benazepril 20 mg by mouth once daily and amlodipine 5 mg by mouth once daily Encourage dietary sodium restriction/DASH diet Recommend home blood pressure monitoring to discuss at next visit Discussed need for medication compliance  Hyperlipidemia (follows with Dr. Harl Bowie): Controlled. LDL at goal of <70 due to very high risk given diabetes + at least 1 additional major risk factor (hypertension and chronic kidney disease (CKD)) per 2020 AACE/ACE guidelines and TG at goal of <150 per 2020 AACE/ACE guidelines. Current medications: atorvastatin 40 mg by mouth once daily Intolerances: none Taking medications as directed: yes Side effects thought to be attributed to current medication regimen: no Continue atorvastatin 40 mg by mouth once daily Discussed need for medication compliance  Chronic Kidney Disease Stage 3b - GFR 30-44 (Moderate to Severely Reduced Function) - follows with Dr. Theador Hawthorne: Appropriately managed Current medications: benazepril 20 mg by mouth once daily and atorvastatin 40 mg by mouth once daily Intolerances: none Taking medications as directed: yes Side effects thought to be attributed to current medication regimen: no Most recent GFR: 34 mL/min Most recent microalbumin: 214 (04/07/21) Continue benazepril 20 mg by mouth once daily and atorvastatin 40 mg by mouth once daily Recommend adequate hydration  Discussed need for improved control of blood sugar  Patient Goals/Self-Care Activities Over the next 90 days, patient will:  Focus on medication adherence by keeping up with prescription refills and either using a pill box or reminders to take your medications at the prescribed times Check blood sugar continuously with continuous glucose  monitor, document, and provide at future appointments Check blood pressure at least once daily, document, and provide at future appointments  Follow Up Plan: Telephone follow  up appointment with care management team member scheduled for: 11/21/21       Medication Assistance: None required.  Patient affirms current coverage meets needs.  Patient's preferred pharmacy is:  [facility], [place] [address] [place] [address] Phone: [phone] Fax: [phone]  [facility] [place], [place] [address] Phone: [phone] Fax: [phone] - [place], [place] [address] Phone: [phone] Fax: [phone]  [facility] [address] [place] [address] Phone: [phone] Fax: [phone]  Follow Up:  Patient agrees to Care Plan and Follow-up.  Plan: Telephone follow up appointment with care management team member scheduled for:  11/21/21  Kennon Holter, PharmD, [place], [place] Clinical Pharmacist Practitioner [facility] [phone]

## 2021-09-23 DIAGNOSIS — Z794 Long term (current) use of insulin: Secondary | ICD-10-CM | POA: Diagnosis not present

## 2021-09-23 DIAGNOSIS — N184 Chronic kidney disease, stage 4 (severe): Secondary | ICD-10-CM | POA: Diagnosis not present

## 2021-09-23 DIAGNOSIS — E785 Hyperlipidemia, unspecified: Secondary | ICD-10-CM | POA: Diagnosis not present

## 2021-09-23 DIAGNOSIS — E1169 Type 2 diabetes mellitus with other specified complication: Secondary | ICD-10-CM

## 2021-09-23 DIAGNOSIS — I10 Essential (primary) hypertension: Secondary | ICD-10-CM | POA: Diagnosis not present

## 2021-09-23 DIAGNOSIS — E1165 Type 2 diabetes mellitus with hyperglycemia: Secondary | ICD-10-CM | POA: Diagnosis not present

## 2021-10-08 ENCOUNTER — Other Ambulatory Visit: Payer: Self-pay | Admitting: Family Medicine

## 2021-10-08 ENCOUNTER — Other Ambulatory Visit (HOSPITAL_COMMUNITY): Payer: Self-pay | Admitting: Psychiatry

## 2021-10-09 NOTE — Telephone Encounter (Signed)
Call for appt

## 2021-10-10 ENCOUNTER — Encounter: Payer: Self-pay | Admitting: Urology

## 2021-10-10 ENCOUNTER — Ambulatory Visit (INDEPENDENT_AMBULATORY_CARE_PROVIDER_SITE_OTHER): Payer: Medicare Other | Admitting: Urology

## 2021-10-10 ENCOUNTER — Other Ambulatory Visit: Payer: Self-pay

## 2021-10-10 VITALS — BP 137/72 | HR 73 | Wt 257.0 lb

## 2021-10-10 DIAGNOSIS — N3941 Urge incontinence: Secondary | ICD-10-CM | POA: Diagnosis not present

## 2021-10-10 DIAGNOSIS — Z87440 Personal history of urinary (tract) infections: Secondary | ICD-10-CM | POA: Insufficient documentation

## 2021-10-10 LAB — URINALYSIS, ROUTINE W REFLEX MICROSCOPIC
Bilirubin, UA: NEGATIVE
Glucose, UA: NEGATIVE
Ketones, UA: NEGATIVE
Leukocytes,UA: NEGATIVE
Nitrite, UA: NEGATIVE
Protein,UA: NEGATIVE
RBC, UA: NEGATIVE
Specific Gravity, UA: 1.01 (ref 1.005–1.030)
Urobilinogen, Ur: 0.2 mg/dL (ref 0.2–1.0)
pH, UA: 5 (ref 5.0–7.5)

## 2021-10-10 MED ORDER — SOLIFENACIN SUCCINATE 10 MG PO TABS: 10.0000 mg | ORAL_TABLET | Freq: Every day | ORAL | 11 refills | Status: DC

## 2021-10-10 NOTE — Progress Notes (Signed)
Urological Symptom Review  Patient is experiencing the following symptoms: Frequent urination Get up at night to urinate Leakage of urine   Review of Systems  Gastrointestinal (upper)  : Negative for upper GI symptoms  Gastrointestinal (lower) : Negative for lower GI symptoms  Constitutional : Negative for symptoms  Skin: Negative for skin symptoms  Eyes: Negative for eye symptoms  Ear/Nose/Throat : Negative for Ear/Nose/Throat symptoms  Hematologic/Lymphatic: Negative for Hematologic/Lymphatic symptoms  Cardiovascular : Leg swelling  Respiratory : Cough  Endocrine: Negative for endocrine symptoms  Musculoskeletal: Joint pain  Neurological: Negative for neurological symptoms  Psychologic: Depression Anxiety

## 2021-10-10 NOTE — Progress Notes (Signed)
Assessment: 1. Urge incontinence   2. History of UTI      Plan: I discussed options for management of her urge incontinence including alternative medical therapy, PTNS, sacral neuromodulation, and chemodenervation with Botox. I also discussed further evaluation with urodynamics. I provided her with information on the purewick device and advised her that unfortunately this is not covered by insurance. Trial of Vesicare 10 mg daily.  Use and side effects discussed. Continue methods to reduce the risk of UTIs including increased fluid intake, timed and double voiding, daily cranberry supplement, and daily probiotics discussed. Advised her to call with results of medication in 3-4 weeks.   I personally spent 30 minutes involved in face to face and non-face-to-face activities for this patient on the day of the visit.  Professional time spent included the following activities, in addition to those noted in the documentation: detailed discussion of management options for urge incontinence, finding patient information on Purewick device, discussion of continued medical therapy and follow-up.  Chief Complaint:  Chief Complaint  Patient presents with   Urinary Incontinence    History of Present Illness:  Laura Atkins is a 81 y.o. year old female who is seen for further evaluation of urinary incontinence and UTI's.  She reported symptoms for 10+ years with urinary frequency, urgency, and urge incontinence.  She has used  using maxipads twice daily.  No prior medical therapy for her incontinence symptoms.  She does have a history of UTIs. Urine culture results: 10/21: E. coli 1/22: <10K colonies 2/22: <10K colonies 7/22: E. coli 8/25: E. Coli  She typically has symptoms of increased frequency and dysuria associated with UTIs.  No history of gross hematuria.  She does have chronic kidney disease and is followed by nephrology. Renal ultrasound from 05/19/2021 showed no mass or  hydronephrosis involving either kidney and a small amount of layering debris in the bladder.  She was given a trial of Myrbetriq 25 mg daily at her visit on 08/08/2021.  She noted some slight improvement in her symptoms but she continue to have incontinence associated with urgency, both daytime and nighttime.  She was using maxipads 2-3 times per day.  No dysuria or gross hematuria.  Her dose of Myrbetriq was increased to 50 mg daily at her visit on 09/05/2021.  She returns today for follow-up.  She continues on Myrbetriq 50 mg daily but has not seen a significant improvement in her symptoms.  She continues to have frequency, urgency, and incontinence both daytime and nighttime.  No dysuria or gross hematuria.  She is interested in a purewick device. She continues to use incontinence pads daily.  Portions of the above documentation were copied from a prior visit for review purposes only.  Past Medical History:  Past Medical History:  Diagnosis Date   Alpha thalassemia trait 01/26/2010   02/2012: Nl CBC ex H&H-10.7/34.8, MCV-69    Anemia    Anxiety    Anxiety and depression    Cellulitis 05/09/2017   Depression    Diabetes mellitus    Foot pain, right 04/30/2013   GERD (gastroesophageal reflux disease)    Headache(784.0)    Hyperlipidemia    Hypertension    Iron deficiency 01/21/2017   Microcytic anemia 01/26/2010   02/2012: Nl CBC ex H&H-10.7/34.8, MCV-69    NECK PAIN, CHRONIC 10/21/2008   +chronic back pain     Obesity    Obstructive sleep apnea    Osteoarthritis    Left knee; right shoulder; chronic  neck and back pain   Pruritus    PVD (peripheral vascular disease) ([place]) 01/28/2014   Seizures (HCC)    Shoulder pain, right 04/14/2015   Tremor    This started months ago after her seizure progressing to very poor hand writing   Urinary incontinence    UTI (urinary tract infection) 01/18/2013    Past Surgical History:  Past Surgical History:  Procedure Laterality Date   ABDOMINAL  HYSTERECTOMY     BREAST EXCISIONAL BIOPSY     Left; cyst   CATARACT EXTRACTION Right    12/2017   CATARACT EXTRACTION W/ INTRAOCULAR LENS IMPLANT Left 09/07/2013   CHOLECYSTECTOMY     COLONOSCOPY     COLONOSCOPY N/A 07/20/2015   Procedure: COLONOSCOPY;  Surgeon: Rogene Houston, MD;  [facility];  Service: Endoscopy;  Laterality: N/A;  930   EYE SURGERY Left 09/07/2013   cataract    Allergies:  Allergies  Allergen Reactions   Penicillins Shortness Of Breath, Itching and Rash   Prednisone Shortness Of Breath, Itching and Rash   Propoxyphene N-Acetaminophen Itching and Nausea And Vomiting   Sulfa Antibiotics Itching and Nausea And Vomiting    Family History:  Family History  Problem Relation Age of Onset   Lung cancer Mother 42   Kidney disease Father    Diabetes Sister    Keloids Brother    ADD / ADHD Grandchild    Bipolar disorder Grandchild    Bipolar disorder Daughter    Seizures Daughter    Heart disease Daughter    Kidney disease Son    Neuropathy Son    Kidney disease Son    Edema Daughter    Breast cancer Daughter 78   Allergies Daughter    Alcohol abuse Neg Hx    Drug abuse Neg Hx     Social History:  Social History   Tobacco Use   Smoking status: Never   Smokeless tobacco: Never  Vaping Use   Vaping Use: Never used  Substance Use Topics   Alcohol use: No    Alcohol/week: 0.0 standard drinks   Drug use: No    ROS: Constitutional:  Negative for fever, chills, weight loss CV: Negative for chest pain, previous MI, hypertension Respiratory:  Negative for shortness of breath, wheezing, sleep apnea, frequent cough GI:  Negative for nausea, vomiting, bloody stool, GERD  Physical exam: BP 137/72    Pulse 73    Wt 257 lb (116.6 kg)    BMI 39.08 kg/m  GENERAL APPEARANCE:  Well appearing, well developed, well nourished, NAD HEENT:  Atraumatic, normocephalic, oropharynx clear NECK:  Supple without lymphadenopathy or thyromegaly ABDOMEN:   Soft, non-tender, no masses EXTREMITIES:  Moves all extremities well, without clubbing, cyanosis, or edema NEUROLOGIC:  Alert and oriented x 3, uses walker, CN II-XII grossly intact MENTAL STATUS:  appropriate SKIN:  Warm, dry, and intact   Results: U/A dipstick negative

## 2021-10-11 ENCOUNTER — Encounter (HOSPITAL_COMMUNITY): Payer: Self-pay | Admitting: Psychiatry

## 2021-10-11 ENCOUNTER — Telehealth (INDEPENDENT_AMBULATORY_CARE_PROVIDER_SITE_OTHER): Payer: Medicare Other | Admitting: Psychiatry

## 2021-10-11 DIAGNOSIS — F32A Depression, unspecified: Secondary | ICD-10-CM

## 2021-10-11 MED ORDER — BUPROPION HCL ER (XL) 150 MG PO TB24: 150.0000 mg | ORAL_TABLET | Freq: Every morning | ORAL | 2 refills | Status: DC

## 2021-10-11 MED ORDER — SERTRALINE HCL 100 MG PO TABS: 100.0000 mg | ORAL_TABLET | Freq: Every day | ORAL | 2 refills | Status: DC

## 2021-10-11 NOTE — Progress Notes (Signed)
Virtual Visit via Telephone Note  I connected with Laura Atkins on 10/11/21 at  1:20 PM EST by telephone and verified that I am speaking with the correct person using two identifiers.  Location: Patient: home Provider: office   I discussed the limitations, risks, security and privacy concerns of performing an evaluation and management service by telephone and the availability of in person appointments. I also discussed with the patient that there may be a patient responsible charge related to this service. The patient expressed understanding and agreed to proceed.      I discussed the assessment and treatment plan with the patient. The patient was provided an opportunity to ask questions and all were answered. The patient agreed with the plan and demonstrated an understanding of the instructions.   The patient was advised to call back or seek an in-person evaluation if the symptoms worsen or if the condition fails to improve as anticipated.  I provided 15 minutes of non-face-to-face time during this encounter.   Levonne Spiller, MD  [facility] MD/PA/NP OP Progress Note  10/11/2021 1:54 PM Laura Atkins  MRN:  [id number]  Chief Complaint:  Chief Complaint   Depression; Follow-up    HPI: This patient is a 81 year old married black female who lives with her husband in [place].  She has 5 grown children.  She worked for the [employer] for 36 years but is now retired.  The patient returns for follow-up regarding depression.  She was last seen about 7 months ago.  The patient states that for the most part she is holding her own.  She still has her chronic health issues such as diabetes and arthritis and difficulty with mobility.  She generally walks with a walker.  She and her family had financial issues.  They are he cannot point out in the candidate for the new one so they are using space eaters.  She finds this very frustrating.  However she denies serious depression thoughts of  self-harm or suicidal ideation she is sleeping well.  She seems fairly sharp but she is on medication for her early dementia.  She does think that the Wellbutrin and Zoloft have helped with her mood.  She also requests going back to see Maurice Small counselor in our office and I think this is reasonable Visit Diagnosis:    ICD-10-CM   1. Depressive disorder  F32.A       Past Psychiatric History: Long-term outpatient treatment  Past Medical History:  Past Medical History:  Diagnosis Date   Alpha thalassemia trait 01/26/2010   02/2012: Nl CBC ex H&H-10.7/34.8, MCV-69    Anemia    Anxiety    Anxiety and depression    Cellulitis 05/09/2017   Depression    Diabetes mellitus    Foot pain, right 04/30/2013   GERD (gastroesophageal reflux disease)    Headache(784.0)    Hyperlipidemia    Hypertension    Iron deficiency 01/21/2017   Microcytic anemia 01/26/2010   02/2012: Nl CBC ex H&H-10.7/34.8, MCV-69    NECK PAIN, CHRONIC 10/21/2008   +chronic back pain     Obesity    Obstructive sleep apnea    Osteoarthritis    Left knee; right shoulder; chronic neck and back pain   Pruritus    PVD (peripheral vascular disease) ([place]) 01/28/2014   Seizures (HCC)    Shoulder pain, right 04/14/2015   Tremor    This started months ago after her seizure progressing to very poor hand writing  Urinary incontinence    UTI (urinary tract infection) 01/18/2013    Past Surgical History:  Procedure Laterality Date   ABDOMINAL HYSTERECTOMY     BREAST EXCISIONAL BIOPSY     Left; cyst   CATARACT EXTRACTION Right    12/2017   CATARACT EXTRACTION W/ INTRAOCULAR LENS IMPLANT Left 09/07/2013   CHOLECYSTECTOMY     COLONOSCOPY     COLONOSCOPY N/A 07/20/2015   Procedure: COLONOSCOPY;  Surgeon: Rogene Houston, MD;  [facility];  Service: Endoscopy;  Laterality: N/A;  930   EYE SURGERY Left 09/07/2013   cataract    Family Psychiatric History: see below  Family History:  Family History  Problem Relation  Age of Onset   Lung cancer Mother 46   Kidney disease Father    Diabetes Sister    Keloids Brother    ADD / ADHD Grandchild    Bipolar disorder Grandchild    Bipolar disorder Daughter    Seizures Daughter    Heart disease Daughter    Kidney disease Son    Neuropathy Son    Kidney disease Son    Edema Daughter    Breast cancer Daughter 49   Allergies Daughter    Alcohol abuse Neg Hx    Drug abuse Neg Hx     Social History:  Social History   Socioeconomic History   Marital status: Married    Spouse name: saunders   Number of children: 5   Years of education: 12   Highest education level: Not on file  Occupational History   Occupation: Disabled     Employer: RETIRED  Tobacco Use   Smoking status: Never   Smokeless tobacco: Never  Vaping Use   Vaping Use: Never used  Substance and Sexual Activity   Alcohol use: No    Alcohol/week: 0.0 standard drinks   Drug use: No   Sexual activity: Yes    Birth control/protection: Surgical  Other Topics Concern   Not on file  Social History Narrative   07/27/20 Patient lives at home with her husband Naval architect) and dgtr- [name].  Patient is retired.    Right handed.    Five Children.    Caffeine- 2 daily   Social Determinants of Health   Financial Resource Strain: Not on file  Food Insecurity: No Food Insecurity   Worried About Charity fundraiser in the Last Year: Never true   Ran Out of Food in the Last Year: Never true  Transportation Needs: No Transportation Needs   Lack of Transportation (Medical): No   Lack of Transportation (Non-Medical): No  Physical Activity: Inactive   Days of Exercise per Week: 0 days   Minutes of Exercise per Session: 0 min  Stress: No Stress Concern Present   Feeling of Stress : Not at all  Social Connections: Moderately Integrated   Frequency of Communication with Friends and Family: More than three times a week   Frequency of Social Gatherings with Friends and Family: More than three times  a week   Attends Religious Services: More than 4 times per year   Active Member of [employer] or Organizations: No   Attends Archivist Meetings: Never   Marital Status: Married    Allergies:  Allergies  Allergen Reactions   Penicillins Shortness Of Breath, Itching and Rash   Prednisone Shortness Of Breath, Itching and Rash   Propoxyphene N-Acetaminophen Itching and Nausea And Vomiting   Sulfa Antibiotics Itching and Nausea And Vomiting  Metabolic Disorder Labs: Lab Results  Component Value Date   HGBA1C 10.0 (H) 04/07/2021   MPG 225.95 05/27/2020   MPG 197 11/26/2018   No results found for: PROLACTIN Lab Results  Component Value Date   CHOL 144 04/07/2021   TRIG 111 04/07/2021   HDL 58 04/07/2021   CHOLHDL 2.6 12/01/2019   VLDL 16 05/14/2017   LDLCALC 66 04/07/2021   LDLCALC 72 12/01/2019   Lab Results  Component Value Date   TSH 1.129 05/27/2020   TSH 2.24 12/01/2019    Therapeutic Level Labs: No results found for: LITHIUM No results found for: VALPROATE No components found for:  CBMZ  Current Medications: Current Outpatient Medications  Medication Sig Dispense Refill   Accu-Chek FastClix Lancets MISC 1 each by Does not apply route 4 (four) times daily. Use to monitor glucose levels 4 times daily; E11.65 306 each 2   ACCU-CHEK GUIDE test strip USE TO TEST BLOOD SUGAR FOUR TIMES DAILY 200 strip 1   Alcohol Swabs (B-D SINGLE USE SWABS REGULAR) PADS 1 each by Does not apply route 4 (four) times daily. Use to prep site for glucose monitoring 4 times daily; E11.65 300 each 2   amLODipine (NORVASC) 5 MG tablet TAKE 1 TABLET BY MOUTH EVERY DAY 30 tablet 3   aspirin 81 MG tablet Take 81 mg by mouth daily.     atorvastatin (LIPITOR) 40 MG tablet TAKE 1 TABLET BY MOUTH EVERY DAY 30 tablet 2   benazepril (LOTENSIN) 20 MG tablet TAKE 1 TABLET BY MOUTH EVERY DAY 30 tablet 5   benzonatate (TESSALON) 100 MG capsule Take 1 capsule (100 mg total) by mouth 2 (two) times  daily as needed for cough. 20 capsule 0   Blood Glucose Monitoring Suppl (ACCU-CHEK GUIDE ME) w/Device KIT 1 each by Does not apply route 4 (four) times daily. Use to monitor glucose levels 4 times daily; E11.65 1 kit 0   buPROPion (WELLBUTRIN XL) 150 MG 24 hr tablet Take 1 tablet (150 mg total) by mouth every morning. 90 tablet 2   Cholecalciferol 125 MCG (5000 UT) TABS Take 1 tablet by mouth daily.     Continuous Blood Gluc Receiver (DEXCOM G6 RECEIVER) DEVI Use to monitor glucose at least twice daily 1 each 0   Continuous Blood Gluc Sensor (DEXCOM G6 SENSOR) MISC Change sensor every 10 days as directed 9 each 3   Continuous Blood Gluc Transmit (DEXCOM G6 TRANSMITTER) MISC Change transmitter every 90 days as directed. 1 each 3   diphenhydrAMINE (BENADRYL) 25 MG tablet Take 25 mg by mouth at bedtime.     donepezil (ARICEPT) 5 MG tablet TAKE 1 TABLET BY MOUTH AT BEDTIME 30 tablet 2   doxycycline (VIBRA-TABS) 100 MG tablet Take 1 tablet (100 mg total) by mouth 2 (two) times daily. 20 tablet 0   ferrous sulfate 325 (65 FE) MG EC tablet Take 1 tablet by mouth 2 (two) times daily with a meal.     gabapentin (NEURONTIN) 400 MG capsule TAKE ONE CAPSULE BY MOUTH EVERY MORNING and TAKE ONE CAPSULE AT NOON and TAKE TWO CAPSULES AT BEDTIME 360 capsule 1   insulin degludec (TRESIBA FLEXTOUCH) 200 UNIT/ML FlexTouch Pen Inject 14 Units into the skin at bedtime. 6 mL 3   Insulin Pen Needle (PEN NEEDLES) 31G X 5 MM MISC Use to inject insulin once daily 100 each 4   Insulin Syringe-Needle U-100 (INSULIN SYRINGE 1CC/31GX5/16") 31G X 5/16" 1 ML MISC 1 each by Does not  apply route daily. Use to inject insulin daily; E11.65 90 each 2   Multiple Vitamin (MULTIVITAMIN) capsule Take 1 capsule by mouth daily.     omeprazole (PRILOSEC) 20 MG capsule TAKE ONE CAPSULE BY MOUTH EVERY DAY 30 capsule 2   potassium chloride SA (KLOR-CON) 20 MEQ tablet TAKE 1 TABLET BY MOUTH TWICE DAILY 60 tablet 1   sertraline (ZOLOFT) 100 MG  tablet Take 1 tablet (100 mg total) by mouth daily. 90 tablet 2   solifenacin (VESICARE) 10 MG tablet Take 1 tablet (10 mg total) by mouth daily. 30 tablet 11   tiZANidine (ZANAFLEX) 2 MG tablet Take 1 tablet (2 mg total) by mouth every 6 (six) hours as needed for muscle spasms. 20 tablet 0   torsemide (DEMADEX) 20 MG tablet TAKE 40 MG EVERY OTHER DAY ALTERNATING 60 MG EVERY OTHER DAY 135 tablet 6   UNABLE TO FIND Walker x 1  DX unsteady gait, osteoarthritis of left knee 1 each 0   UNABLE TO FIND Elevated [place] Seat x 1 DX: unsteady gait, back pain, osteoarthritis of left knee 1 each 0   UNABLE TO FIND Standing upright walker x 1  DX M54.40, M19.90 1 each 0   UNABLE TO FIND Incontinence pads and supplies 1 each 1   UNABLE TO FIND Rollator Walker 1 Product 0   UNABLE TO FIND XL Tranquility Briefs for incontinence. 1 Package 3   No current facility-administered medications for this visit.     Musculoskeletal: Strength & Muscle Tone: na Gait & Station: na Patient leans: N/A  Psychiatric Specialty Exam: Review of Systems  Constitutional:  Positive for fatigue.  Musculoskeletal:  Positive for arthralgias, back pain and gait problem.  All other systems reviewed and are negative.  There were no vitals taken for this visit.There is no height or weight on file to calculate BMI.  General Appearance: NA  Eye Contact:  NA  Speech:  Clear and Coherent  Volume:  Normal  Mood:  Euthymic  Affect:  NA  Thought Process:  Goal Directed  Orientation:  Full (Time, Place, and Person)  Thought Content: Rumination   Suicidal Thoughts:  No  Homicidal Thoughts:  No  Memory:  Immediate;   Good Recent;   Fair Remote;   NA  Judgement:  Good  Insight:  Fair  Psychomotor Activity:  Decreased  Concentration:  Concentration: Fair and Attention Span: Fair  Recall:  [place] of Knowledge: Good  Language: Good  Akathisia:  No  Handed:  Right  AIMS (if indicated): not done  Assets:  Communication  Skills Desire for Improvement Resilience Social Support Talents/Skills  ADL's:  Intact  Cognition: WNL  Sleep:  Good   Screenings: CAGE-AID    [place] Visit from 02/06/2019 in [facility] Score 0      GAD-7    [place] Visit from 03/09/2020 in [facility] Office Visit from 04/28/2018 in [facility]  Total GAD-7 Score 16 [place] Visit from 07/27/2020 in [facility] Office Visit from 07/13/2020 in [facility]  Total Score (max 30 points ) 20 23      PHQ2-9    Flowsheet Row Video Visit from 10/11/2021 in [facility] Office Visit from 09/14/2021 in [facility] [place] from 03/15/2021 in [facility] Video Visit from 02/02/2021 in [facility] Office Visit from 01/26/2021 in [facility]  PHQ-2 Total Score 1 0 0 3 0  PHQ-9 Total Score -- -- 0 8 3      Flowsheet Row Video Visit from 10/11/2021 in [facility] IV Medication 120 from 07/26/2021 in [facility] IV Medication 120 from 07/19/2021 in [facility] No Risk No Risk No Risk        Assessment and Plan: This patient is a 81 year old female with a long history of depression and early dementia.  She still seems to be fairly sharp although as usual she tends to be somewhat tangential.  She feels that her medications for mood have helped so we will continue Zoloft 100 mg daily as well as Wellbutrin XL 150 mg daily for depression.  She will continue Aricept as prescribed by Dr. Moshe Cipro.  She requests follow-up with counseling and we will call her for this.  She will return to see me in 6 months   Levonne Spiller, MD 10/11/2021, 1:54 PM

## 2021-10-12 ENCOUNTER — Ambulatory Visit (INDEPENDENT_AMBULATORY_CARE_PROVIDER_SITE_OTHER): Payer: Medicare Other | Admitting: Psychiatry

## 2021-10-12 ENCOUNTER — Other Ambulatory Visit: Payer: Self-pay

## 2021-10-12 DIAGNOSIS — F32A Depression, unspecified: Secondary | ICD-10-CM
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
## 2021-10-24 ENCOUNTER — Ambulatory Visit (INDEPENDENT_AMBULATORY_CARE_PROVIDER_SITE_OTHER): Payer: Medicare Other | Admitting: Family Medicine

## 2021-10-24 ENCOUNTER — Other Ambulatory Visit: Payer: Self-pay

## 2021-10-24 ENCOUNTER — Encounter: Payer: Self-pay | Admitting: Family Medicine

## 2021-10-24 ENCOUNTER — Encounter: Payer: Medicare HMO | Admitting: Family Medicine

## 2021-10-24 VITALS — BP 130/72 | HR 88 | Resp 17 | Ht 68.0 in | Wt 255.0 lb

## 2021-10-24 DIAGNOSIS — E1165 Type 2 diabetes mellitus with hyperglycemia: Secondary | ICD-10-CM

## 2021-10-24 DIAGNOSIS — Z794 Long term (current) use of insulin: Secondary | ICD-10-CM

## 2021-10-24 DIAGNOSIS — K6289 Other specified diseases of anus and rectum: Secondary | ICD-10-CM | POA: Insufficient documentation

## 2021-10-24 DIAGNOSIS — I10 Essential (primary) hypertension: Secondary | ICD-10-CM

## 2021-10-24 DIAGNOSIS — E559 Vitamin D deficiency, unspecified: Secondary | ICD-10-CM

## 2021-10-24 DIAGNOSIS — Z0001 Encounter for general adult medical examination with abnormal findings: Secondary | ICD-10-CM

## 2021-10-24 LAB — POCT GLYCOSYLATED HEMOGLOBIN (HGB A1C): HbA1c, POC (controlled diabetic range): 7.8 % — AB (ref 0.0–7.0)

## 2021-10-24 NOTE — Assessment & Plan Note (Signed)
Annual exam as documented. . Immunization and cancer screening needs are specifically addressed at this visit.  

## 2021-10-24 NOTE — Assessment & Plan Note (Signed)
GI to eval and manage

## 2021-10-24 NOTE — Progress Notes (Signed)
° ° °  Laura Atkins     MRN: [id number]      DOB: May 14, 1941  HPI: Patient is in for annual physical exam. C/o rectal/ anal pressure for months, denies rectal blood  or constipation C/o increased bilateral knee pain but no interest in ortho eval and intra articular injections at this time Immunization is reviewed , and  updated if needed.   PE: BP 130/72    Pulse 88    Resp 17    Ht 5\' 8"  (1.727 m)    Wt 255 lb (115.7 kg)    SpO2 98%    BMI 38.77 kg/m   Pleasant  female, alert and oriented x 3, in no cardio-pulmonary distress. Afebrile. HEENT No facial trauma or asymetry. Sinuses non tender.  Extra occullar muscles intact.. External ears normal, . Neck: decreased ROM no adenopathy,JVD or thyromegaly.No bruits.  Chest: Clear to ascultation bilaterally.No crackles or wheezes. Non tender to palpation  No axillary or supraclavicular adenopathy  Cardiovascular system; Heart sounds normal,  S1 and  S2 ,no S3.  No murmur, or thrill. Apical beat not displaced Peripheral pulses normal.  Abdomen: Soft, non tender    Musculoskeletal exam: Markedly reduced  ROM of spine, hips , shoulders and knees. deformity ,swelling  and crepitus noted. No muscle wasting or atrophy.   Neurologic: Cranial nerves 2 to 12 intact.decreased hearing and vision loss Power, tone ,sensation  normal throughout.  disturbance in gait. No tremor.Requires assistive device for safe mobility  Skin: Intact, no ulceration, erythema , scaling or rash noted. Pigmentation normal throughout  Psych; Normal mood and affect. Judgement and concentration normal   Assessment & Plan:  Annual visit for general adult medical examination with abnormal findings Annual exam as documented. Immunization and cancer screening needs are specifically addressed at this visit.   Rectal pain GI to eval and manage

## 2021-10-24 NOTE — Patient Instructions (Addendum)
F/U in 4 months, call if you need me before  Labs today, CBC. Lipid, cm[p and EGFR, TSH and vit D  Congrats on improved   blood sugar, keep it up  Weight loss will improve knee pain  Mammogram and bone density tests will be ordered  You are referred to GI re rectal/ anal pain  Careful not to fall  Thanks for choosing [facility], we consider it a privelige to serve you. Need shingrix vaccines at the pharmacy

## 2021-10-26 LAB — LIPID PANEL
Chol/HDL Ratio: 2.6 ratio (ref 0.0–4.4)
Cholesterol, Total: 153 mg/dL (ref 100–199)
HDL: 60 mg/dL (ref >39)
LDL Chol Calc (NIH): 70 mg/dL (ref 0–99)
Triglycerides: 136 mg/dL (ref 0–149)
VLDL Cholesterol Cal: 23 mg/dL (ref 5–40)

## 2021-10-26 LAB — CMP14+EGFR
ALT: 20 IU/L (ref 0–32)
AST: 21 IU/L (ref 0–40)
Albumin/Globulin Ratio: 1.6 (ref 1.2–2.2)
Albumin: 4.3 g/dL (ref 3.7–4.7)
Alkaline Phosphatase: 111 IU/L (ref 44–121)
BUN/Creatinine Ratio: 25 (ref 12–28)
BUN: 42 mg/dL — ABNORMAL HIGH (ref 8–27)
Bilirubin Total: 0.2 mg/dL (ref 0.0–1.2)
CO2: 24 mmol/L (ref 20–29)
Calcium: 9.5 mg/dL (ref 8.7–10.3)
Chloride: 105 mmol/L (ref 96–106)
Creatinine, Ser: 1.65 mg/dL — ABNORMAL HIGH (ref 0.57–1.00)
Globulin, Total: 2.7 g/dL (ref 1.5–4.5)
Glucose: 122 mg/dL — ABNORMAL HIGH (ref 70–99)
Potassium: 5.3 mmol/L — ABNORMAL HIGH (ref 3.5–5.2)
Sodium: 146 mmol/L — ABNORMAL HIGH (ref 134–144)
Total Protein: 7 g/dL (ref 6.0–8.5)
eGFR: 32 mL/min/{1.73_m2} — ABNORMAL LOW (ref >59)

## 2021-10-26 LAB — TSH: TSH: 2.75 u[IU]/mL (ref 0.450–4.500)

## 2021-10-26 LAB — CBC
Hematocrit: 30.3 % — ABNORMAL LOW (ref 34.0–46.6)
Hemoglobin: 9 g/dL — ABNORMAL LOW (ref 11.1–15.9)
MCH: 20.8 pg — ABNORMAL LOW (ref 26.6–33.0)
MCHC: 29.7 g/dL — ABNORMAL LOW (ref 31.5–35.7)
MCV: 70 fL — ABNORMAL LOW (ref 79–97)
Platelets: 218 10*3/uL (ref 150–450)
RBC: 4.32 x10E6/uL (ref 3.77–5.28)
RDW: 15.9 % — ABNORMAL HIGH (ref 11.7–15.4)
WBC: 6.3 10*3/uL (ref 3.4–10.8)

## 2021-10-26 LAB — VITAMIN D 25 HYDROXY (VIT D DEFICIENCY, FRACTURES): Vit D, 25-Hydroxy: 52.9 ng/mL (ref 30.0–100.0)

## 2021-11-01 DIAGNOSIS — N189 Chronic kidney disease, unspecified: Secondary | ICD-10-CM | POA: Diagnosis not present

## 2021-11-01 DIAGNOSIS — E1122 Type 2 diabetes mellitus with diabetic chronic kidney disease: Secondary | ICD-10-CM | POA: Diagnosis not present

## 2021-11-01 DIAGNOSIS — I129 Hypertensive chronic kidney disease with stage 1 through stage 4 chronic kidney disease, or unspecified chronic kidney disease: Secondary | ICD-10-CM | POA: Diagnosis not present

## 2021-11-01 DIAGNOSIS — D508 Other iron deficiency anemias: Secondary | ICD-10-CM | POA: Diagnosis not present

## 2021-11-01 DIAGNOSIS — R809 Proteinuria, unspecified: Secondary | ICD-10-CM | POA: Diagnosis not present

## 2021-11-02 ENCOUNTER — Other Ambulatory Visit: Payer: Self-pay | Admitting: Endocrinology

## 2021-11-02 DIAGNOSIS — Z794 Long term (current) use of insulin: Secondary | ICD-10-CM

## 2021-11-02 DIAGNOSIS — E1165 Type 2 diabetes mellitus with hyperglycemia: Secondary | ICD-10-CM

## 2021-11-06 DIAGNOSIS — I129 Hypertensive chronic kidney disease with stage 1 through stage 4 chronic kidney disease, or unspecified chronic kidney disease: Secondary | ICD-10-CM | POA: Diagnosis not present

## 2021-11-06 DIAGNOSIS — N189 Chronic kidney disease, unspecified: Secondary | ICD-10-CM | POA: Diagnosis not present

## 2021-11-06 DIAGNOSIS — E1122 Type 2 diabetes mellitus with diabetic chronic kidney disease: Secondary | ICD-10-CM | POA: Diagnosis not present

## 2021-11-06 DIAGNOSIS — R809 Proteinuria, unspecified: Secondary | ICD-10-CM | POA: Diagnosis not present

## 2021-11-06 DIAGNOSIS — D508 Other iron deficiency anemias: Secondary | ICD-10-CM | POA: Diagnosis not present

## 2021-11-07 ENCOUNTER — Other Ambulatory Visit: Payer: Self-pay | Admitting: Family Medicine

## 2021-11-07 DIAGNOSIS — E1122 Type 2 diabetes mellitus with diabetic chronic kidney disease: Secondary | ICD-10-CM | POA: Diagnosis not present

## 2021-11-07 DIAGNOSIS — N189 Chronic kidney disease, unspecified: Secondary | ICD-10-CM | POA: Diagnosis not present

## 2021-11-07 DIAGNOSIS — D638 Anemia in other chronic diseases classified elsewhere: Secondary | ICD-10-CM | POA: Diagnosis not present

## 2021-11-07 DIAGNOSIS — R809 Proteinuria, unspecified: Secondary | ICD-10-CM | POA: Diagnosis not present

## 2021-11-07 DIAGNOSIS — R531 Weakness: Secondary | ICD-10-CM | POA: Diagnosis not present

## 2021-11-07 DIAGNOSIS — I129 Hypertensive chronic kidney disease with stage 1 through stage 4 chronic kidney disease, or unspecified chronic kidney disease: Secondary | ICD-10-CM | POA: Diagnosis not present

## 2021-11-08 ENCOUNTER — Ambulatory Visit (HOSPITAL_COMMUNITY): Payer: Medicare Other

## 2021-11-08 ENCOUNTER — Inpatient Hospital Stay (HOSPITAL_COMMUNITY): Admission: RE | Admit: 2021-11-08 | Payer: Medicare Other | Source: Ambulatory Visit

## 2021-11-09 ENCOUNTER — Ambulatory Visit (INDEPENDENT_AMBULATORY_CARE_PROVIDER_SITE_OTHER): Payer: PPO | Admitting: Orthopaedic Surgery

## 2021-11-15 ENCOUNTER — Other Ambulatory Visit: Payer: Self-pay | Admitting: Family Medicine

## 2021-11-15 ENCOUNTER — Telehealth: Payer: Self-pay

## 2021-11-15 DIAGNOSIS — K6289 Other specified diseases of anus and rectum: Secondary | ICD-10-CM

## 2021-11-15 NOTE — Telephone Encounter (Signed)
Patient called to let Dr Moshe Cipro know she is still having the pain in her intestines as discuss with Dr Moshe Cipro before.

## 2021-11-15 NOTE — Telephone Encounter (Signed)
Patient aware.

## 2021-11-16 ENCOUNTER — Encounter (INDEPENDENT_AMBULATORY_CARE_PROVIDER_SITE_OTHER): Payer: Self-pay | Admitting: *Deleted

## 2021-11-21 ENCOUNTER — Encounter (HOSPITAL_COMMUNITY)
Admission: RE | Admit: 2021-11-21 | Discharge: 2021-11-21 | Disposition: A | Discharge status: Home / Self Care | Payer: Medicare Other | Source: Ambulatory Visit | Attending: Nephrology | Admitting: Nephrology

## 2021-11-21 ENCOUNTER — Encounter (HOSPITAL_COMMUNITY): Payer: Self-pay

## 2021-11-21 ENCOUNTER — Ambulatory Visit (INDEPENDENT_AMBULATORY_CARE_PROVIDER_SITE_OTHER): Payer: Medicare Other | Admitting: Pharmacist

## 2021-11-21 DIAGNOSIS — D631 Anemia in chronic kidney disease: Secondary | ICD-10-CM | POA: Diagnosis not present

## 2021-11-21 DIAGNOSIS — E1165 Type 2 diabetes mellitus with hyperglycemia: Secondary | ICD-10-CM | POA: Diagnosis not present

## 2021-11-21 DIAGNOSIS — N1832 Chronic kidney disease, stage 3b: Secondary | ICD-10-CM | POA: Diagnosis not present

## 2021-11-21 DIAGNOSIS — E785 Hyperlipidemia, unspecified: Secondary | ICD-10-CM | POA: Diagnosis not present

## 2021-11-21 DIAGNOSIS — E1122 Type 2 diabetes mellitus with diabetic chronic kidney disease: Secondary | ICD-10-CM

## 2021-11-21 DIAGNOSIS — Z794 Long term (current) use of insulin: Secondary | ICD-10-CM

## 2021-11-21 DIAGNOSIS — E1169 Type 2 diabetes mellitus with other specified complication: Secondary | ICD-10-CM

## 2021-11-21 DIAGNOSIS — I129 Hypertensive chronic kidney disease with stage 1 through stage 4 chronic kidney disease, or unspecified chronic kidney disease: Secondary | ICD-10-CM | POA: Diagnosis not present

## 2021-11-21 DIAGNOSIS — N184 Chronic kidney disease, stage 4 (severe): Secondary | ICD-10-CM

## 2021-11-21 DIAGNOSIS — I10 Essential (primary) hypertension: Secondary | ICD-10-CM

## 2021-11-21 LAB — POCT HEMOGLOBIN-HEMACUE: Hemoglobin: 9.3 g/dL — ABNORMAL LOW (ref 12.0–15.0)

## 2021-11-21 MED ORDER — EPOETIN ALFA-EPBX 10000 UNIT/ML IJ SOLN
4000.0000 [IU] | Freq: Once | INTRAMUSCULAR | Status: AC
  Administered 2021-11-21: 4000 [IU] via SUBCUTANEOUS
  Filled 2021-11-21: qty 1

## 2021-11-21 NOTE — Patient Instructions (Signed)
Laura Atkins,  It was great to talk to you today!  Please call me with any questions or concerns.  Visit Information  Following are the goals we discussed today:   Goals Addressed             This Visit's Progress    Medication Management       Patient Goals/Self-Care Activities Patient will:  Focus on medication adherence by keeping up with prescription refills and either using a pill box or reminders to take your medications at the prescribed times Check blood sugar once a day at the following times: fasting (at least 8 hours since last food consumption) and whenever patient experiences symptoms of hypo/hyperglycemia, document, and provide at future appointments Check blood pressure at least once daily, document, and provide at future appointments            Follow-up plan: Next PCP appointment scheduled for: 02/20/22  The patient verbalized understanding of instructions, educational materials, and care plan provided today and declined offer to receive copy of patient instructions, educational materials, and care plan.   Please call the care guide team at [phone] if you need to cancel or reschedule your appointment.   Kennon Holter, PharmD, [place], CPP Clinical Pharmacist Practitioner [facility] [phone]

## 2021-11-21 NOTE — Chronic Care Management (AMB) (Signed)
Chronic Care Management Pharmacy Note  11/21/2021 Name:  Laura Atkins MRN:  [id number] DOB:  1941-02-16  Summary: Type 2 Diabetes (Follows with Ms Marinus Maw @ endocrinology): Uncontrolled (slightly above goal); Most recent A1c improved to 7.8% from 10.0%. Given patient's other comorbidities, A1c goal 7-7.5% is reasonable Current medications: insulin degludec Tyler Aas) 14 units subcutaneously once daily Continue current insulin regimen per endocrinology  Hypertension: Blood pressure under good control. Blood pressure is at goal of <140/90 mmHg per 2022 AAFP guidelines. Current medications: benazepril 20 mg by mouth once daily, amlodipine 5 mg by mouth once daily, and torsemide 40-60 mg daily (alternating) + potassium repletion 20 mEq twice daily  Continue current medications as above  Hyperlipidemia (follows with Dr. Harl Bowie): Controlled. LDL at goal of <70 due to very high risk given diabetes + at least 1 additional major risk factor (hypertension and chronic kidney disease (CKD)) per 2020 AACE/ACE guidelines and TG at goal of <150 per 2020 AACE/ACE guidelines. Continue atorvastatin 40 mg by mouth once daily  Chronic Kidney Disease Stage 3b - follows with Dr. Theador Hawthorne: Current medications: benazepril 20 mg by mouth once daily, atorvastatin 40 mg by mouth once daily, and torsemide 40-60 mg daily (alternating) + potassium repletion 20 mEq twice daily  Most recent GFR: 32 mL/min Continue current medications as above Continue follow-up with nephrology   Subjective: Laura Atkins is an 81 y.o. year old female who is a primary patient of Fayrene Helper, MD.  The CCM team was consulted for assistance with disease management and care coordination needs.    Engaged with patient's daughter/caregiver [name] by telephone for follow up visit in response to provider referral for pharmacy case management and/or care coordination services.   Consent to Services:  The patient was given  information about Chronic Care Management services, agreed to services, and gave verbal consent prior to initiation of services.  Please see initial visit note for detailed documentation.   Patient Care Team: Fayrene Helper, MD as PCP - General Branch, Alphonse Guild, MD as PCP - Cardiology (Cardiology) Marcial Pacas, MD as Consulting Physician (Neurology) Renato Shin, MD as Consulting Physician (Endocrinology) Cloria Spring, MD as Consulting Physician ([place]) Alonza Smoker, LCSW as Social Worker (Psychiatry) Clent Jacks, MD as Consulting Physician (Ophthalmology) Beryle Lathe, [facility] (Pharmacist)  Objective:  Lab Results  Component Value Date   CREATININE 1.65 (H) 10/24/2021   CREATININE 1.55 (H) 04/07/2021   CREATININE 1.13 (H) 06/03/2020    Lab Results  Component Value Date   HGBA1C 7.8 (A) 10/24/2021   Last diabetic Eye exam:  Lab Results  Component Value Date/Time   HMDIABEYEEXA Retinopathy (A) 02/15/2021 12:00 AM    Last diabetic Foot exam: No results found for: HMDIABFOOTEX      Component Value Date/Time   CHOL 153 10/24/2021 1218   TRIG 136 10/24/2021 1218   HDL 60 10/24/2021 1218   CHOLHDL 2.6 10/24/2021 1218   CHOLHDL 2.6 12/01/2019 1137   VLDL 16 05/14/2017 1039   LDLCALC 70 10/24/2021 1218   LDLCALC 72 12/01/2019 1137    Hepatic Function Latest Ref Rng & Units 10/24/2021 04/07/2021 06/03/2020  Total Protein 6.0 - 8.5 g/dL 7.0 7.3 7.1  Albumin 3.7 - 4.7 g/dL 4.3 4.4 3.6  AST 0 - 40 IU/L 21 14 35  ALT 0 - 32 IU/L 20 16 38  Alk Phosphatase 44 - 121 IU/L 111 137(H) 76  Total Bilirubin 0.0 - 1.2 mg/dL 0.2 0.2  0.5  Bilirubin, Direct 0.0 - 0.3 mg/dL - - -    Lab Results  Component Value Date/Time   TSH 2.750 10/24/2021 12:18 PM   TSH 1.129 05/27/2020 06:28 PM   TSH 2.24 12/01/2019 11:37 AM    CBC Latest Ref Rng & Units 11/21/2021 10/24/2021 04/07/2021  WBC 3.4 - 10.8 x10E3/uL - 6.3 6.1  Hemoglobin 12.0 - 15.0 g/dL 9.3(L) 9.0(L)  8.5(L)  Hematocrit 34.0 - 46.6 % - 30.3(L) 28.3(L)  Platelets 150 - 450 x10E3/uL - 218 225    Lab Results  Component Value Date/Time   VD25OH 52.9 10/24/2021 12:18 PM   VD25OH 38 12/01/2019 11:37 AM    Clinical ASCVD: No  The 10-year ASCVD risk score (Arnett DK, et al., 2019) is: 35.6%   Values used to calculate the score:     Age: 62 years     Sex: Female     Is Non-Hispanic African American: Yes     Diabetic: Yes     Tobacco smoker: No     Systolic Blood Pressure: 163 mmHg     Is BP treated: Yes     HDL Cholesterol: 60 mg/dL     Total Cholesterol: 153 mg/dL    Social History   Tobacco Use  Smoking Status Never  Smokeless Tobacco Never   BP Readings from Last 3 Encounters:  11/21/21 (!) 144/57  10/24/21 130/72  10/10/21 137/72   Pulse Readings from Last 3 Encounters:  11/21/21 63  10/24/21 88  10/10/21 73   Wt Readings from Last 3 Encounters:  11/21/21 255 lb 1.2 oz (115.7 kg)  10/24/21 255 lb (115.7 kg)  10/10/21 257 lb (116.6 kg)    Assessment: Review of patient past medical history, allergies, medications, health status, including review of consultants reports, laboratory and other test data, was performed as part of comprehensive evaluation and provision of chronic care management services.   SDOH:  (Social Determinants of Health) assessments and interventions performed:    CCM Care Plan  Allergies  Allergen Reactions   Penicillins Shortness Of Breath, Itching and Rash   Prednisone Shortness Of Breath, Itching and Rash   Propoxyphene N-Acetaminophen Itching and Nausea And Vomiting   Sulfa Antibiotics Itching and Nausea And Vomiting    Medications Reviewed Today     Reviewed by Beryle Lathe, [facility] (Pharmacist) on 11/21/21 at 1326  Med List Status: <None>   Medication Order Taking? Sig Documenting Provider Last Dose Status Informant  Accu-Chek FastClix Lancets MISC [id number] Yes 1 each by Does not apply route 4 (four) times daily. Use to  monitor glucose levels 4 times daily; E11.65 Renato Shin, MD Taking Active Family Member  ACCU-CHEK GUIDE test strip [id number] Yes USE TO TEST BLOOD SUGAR FOUR TIMES DAILY Renato Shin, MD Taking Active   Alcohol Swabs (B-D SINGLE USE SWABS REGULAR) PADS [id number] Yes 1 each by Does not apply route 4 (four) times daily. Use to prep site for glucose monitoring 4 times daily; E11.65 Renato Shin, MD Taking Active Family Member  amLODipine (NORVASC) 5 MG tablet [id number] Yes TAKE 1 TABLET BY MOUTH EVERY DAY Fayrene Helper, MD Taking Active   aspirin 81 MG tablet [id number] Yes Take 81 mg by mouth daily. [provider] Taking Active Family Member  atorvastatin (LIPITOR) 40 MG tablet [id number] Yes TAKE 1 TABLET BY MOUTH EVERY DAY Fayrene Helper, MD Taking Active   benazepril (LOTENSIN) 20 MG tablet [id number] Yes TAKE 1 TABLET BY MOUTH EVERY DAY Tula Nakayama  E, MD Taking Active   Blood Glucose Monitoring Suppl (ACCU-CHEK GUIDE ME) w/Device KIT [id number] Yes 1 each by Does not apply route 4 (four) times daily. Use to monitor glucose levels 4 times daily; E11.65 Renato Shin, MD Taking Active Family Member  buPROPion (WELLBUTRIN XL) 150 MG 24 hr tablet [id number] Yes Take 1 tablet (150 mg total) by mouth every morning. Cloria Spring, MD Taking Active   Cholecalciferol 125 MCG (5000 UT) TABS [id number] Yes Take 1 tablet by mouth daily. [provider] Taking Active Family Member  Continuous Blood Gluc Receiver Benjie Karvonen G6 RECEIVER) DEVI [id number] No Use to monitor glucose at least twice daily  Patient not taking: Reported on 11/21/2021   Brita Romp, NP Not Taking Active   Continuous Blood Gluc Sensor (DEXCOM G6 SENSOR) MISC [id number] No Change sensor every 10 days as directed  Patient not taking: Reported on 11/21/2021   Brita Romp, NP Not Taking Active   Continuous Blood Gluc Transmit (DEXCOM G6 TRANSMITTER) MISC [id number] No Change transmitter every 90 days  as directed.  Patient not taking: Reported on 11/21/2021   Brita Romp, NP Not Taking Active   diphenhydrAMINE (BENADRYL) 25 MG tablet [id number] Yes Take 25 mg by mouth at bedtime. [provider] Taking Active Family Member  donepezil (ARICEPT) 5 MG tablet [id number] Yes TAKE 1 TABLET BY MOUTH AT BEDTIME Fayrene Helper, MD Taking Active   ferrous sulfate 325 (65 FE) MG EC tablet [id number] Yes Take 1 tablet by mouth 2 (two) times daily with a meal. [provider] Taking Active   gabapentin (NEURONTIN) 400 MG capsule [id number] Yes TAKE ONE CAPSULE BY MOUTH EVERY MORNING and TAKE ONE CAPSULE AT NOON and TAKE TWO CAPSULES AT BEDTIME Fayrene Helper, MD Taking Active   insulin degludec (TRESIBA FLEXTOUCH) 200 UNIT/ML FlexTouch Pen [id number] Yes Inject 14 Units into the skin at bedtime. Brita Romp, NP Taking Active   Insulin Pen Needle (PEN NEEDLES) 31G X 5 MM MISC [id number] Yes Use to inject insulin once daily Brita Romp, NP Taking Active   Insulin Syringe-Needle U-100 (INSULIN SYRINGE 1CC/31GX5/16") 31G X 5/16" 1 ML MISC [id number] Yes 1 each by Does not apply route daily. Use to inject insulin daily; E11.65 Renato Shin, MD Taking Active Family Member  Multiple Vitamin (MULTIVITAMIN) capsule [id number] Yes Take 1 capsule by mouth daily. [provider] Taking Active   omeprazole (PRILOSEC) 20 MG capsule [id number] Yes TAKE ONE CAPSULE BY MOUTH EVERY DAY Fayrene Helper, MD Taking Active   potassium chloride SA (KLOR-CON M) 20 MEQ tablet [id number] Yes TAKE 1 TABLET BY MOUTH TWICE DAILY Fayrene Helper, MD Taking Active   sertraline (ZOLOFT) 100 MG tablet [id number] Yes Take 1 tablet (100 mg total) by mouth daily. Cloria Spring, MD Taking Active   solifenacin (VESICARE) 10 MG tablet [id number] Yes Take 1 tablet (10 mg total) by mouth daily. Stoneking, Reece Leader., MD Taking Active   tiZANidine (ZANAFLEX) 2 MG tablet [id number] Yes Take 1 tablet  (2 mg total) by mouth every 6 (six) hours as needed for muscle spasms. Scot Jun, FNP Taking Active   torsemide (DEMADEX) 20 MG tablet [id number] Yes TAKE 40 MG EVERY OTHER DAY ALTERNATING 60 MG EVERY OTHER DAY Arnoldo Lenis, MD Taking Active   UNABLE TO FIND [id number] No Walker x 1  DX unsteady gait, osteoarthritis of left knee Fayrene Helper, MD Unknown Active   UNABLE TO FIND [id number] No  Elevated [place] Seat x 1 DX: unsteady gait, back pain, osteoarthritis of left knee Fayrene Helper, MD Unknown Active   UNABLE TO FIND [id number] No Standing upright walker x 1  DX M54.40, M19.90 Fayrene Helper, MD Unknown Active   UNABLE TO FIND [id number] No Incontinence pads and supplies Noreene Larsson, NP Unknown Active   UNABLE TO FIND [id number] No Rollator Dalene Seltzer, MD Unknown Active   UNABLE TO FIND [id number] No XL Tranquility Briefs for incontinence. Fayrene Helper, MD Unknown Active   Med List Note Perlie Mayo, NP 02/06/19 1038):                Patient Active Problem List   Diagnosis Date Noted   Rectal pain 10/24/2021   Urge incontinence 10/10/2021   Polyneuropathy associated with underlying disease ([place]) 01/06/2021   Chronic kidney disease, stage 4 (severe) ([place]) 01/06/2021   Dementia ([place]) 07/13/2020   Cognitive deficits    Carotid bruit 05/26/2020   Hyperlipidemia associated with type 2 diabetes mellitus ([place]) 12/08/2019   Annual visit for general adult medical examination with abnormal findings 12/08/2019   Left knee pain 02/11/2017   Iron deficiency 01/21/2017   Vitamin D deficiency 10/15/2016   Urinary incontinence 10/15/2016   Limited mobility 05/27/2016   Type 2 diabetes mellitus with hyperglycemia ([place]) 02/04/2016   Sleep terror disorder 12/14/2015   Fecal incontinence 12/13/2015   Back pain of lumbar region with sciatica 04/15/2015   Spondylosis, cervical, with myelopathy 04/14/2015   OSA (obstructive sleep apnea)  04/10/2013   Peripheral neuropathy 03/12/2013   Hearing loss 11/06/2012   Seizure-like activity ([place]) 01/25/2012   UNSTEADY GAIT 11/07/2010   Alpha thalassemia ([place]) 01/26/2010   Morbid obesity ([place]) 04/21/2008   Anxiety and depression 04/21/2008   Essential hypertension 04/21/2008   GERD 04/21/2008   Osteoarthritis of left knee 04/21/2008    Immunization History  Administered Date(s) Administered   Fluad Quad(high Dose 65+) 05/20/2019   H1N1 07/15/2008   Influenza Split 06/17/2012   Influenza Whole 07/03/2007, 06/17/2009, 06/07/2011   Influenza, High Dose Seasonal PF 07/17/2018   Influenza,inj,Quad PF,6+ Mos 07/13/2013, 09/06/2014, 08/10/2015, 05/23/2016, 05/22/2017   Pneumococcal Conjugate-13 05/03/2014   Pneumococcal Polysaccharide-23 09/08/2010   Td 09/08/2010   Tdap 07/17/2018   Unspecified SARS-COV-2 Vaccination 09/25/2019, 10/26/2019    Conditions to be addressed/monitored: HTN, HLD, DMII, and CKD Stage 3b  Care Plan : Medication Management  Updates made by Beryle Lathe, [place] since 11/21/2021 12:00 AM     Problem: HTN, T2DM, CKD, HLD   Priority: High  Onset Date: 06/01/2021     Long-Range Goal: Disease Progression Prevention   Start Date: 06/01/2021  Expected End Date: 08/30/2021  Recent Progress: On track  Priority: High  Note:   Current Barriers:  Unable to independently monitor therapeutic efficacy Unable to achieve control of diabetes  Pharmacist Clinical Goal(s):  Through collaboration with PharmD and provider, patient will  Achieve adherence to monitoring guidelines and medication adherence to achieve therapeutic efficacy Achieve control of diabetes as evidenced by improved fasting blood sugar and improved A1c   Interventions: 1:1 collaboration with Fayrene Helper, MD regarding development and update of comprehensive plan of care as evidenced by provider attestation and co-signature Inter-disciplinary care team collaboration (see  longitudinal plan of care) Comprehensive medication review performed; medication list updated in electronic medical record  Type 2 Diabetes (Follows with Ms Marinus Maw @ endocrinology): Uncontrolled (slightly above goal); Most recent A1c improved to 7.8%  from 10.0%. Given patient's other comorbidities, A1c goal 7-7.5% is reasonable Current medications: insulin degludec Tyler Aas) 14 units subcutaneously once daily No metformin given renal insuffiencey. Avoid SGLT2 inhibitors given recurrent UTIs. Intolerances: none Taking medications as directed: yes Side effects thought to be attributed to current medication regimen: no Denies hypoglycemic symptoms (sweaty and shaky). Current meal patterns: not discussed today Current exercise:  very limited On a statin: yes On aspirin 81 mg daily: yes Last microalbumin: 214 (04/07/21); on an ACEi/ARB: yes Current glucose readings:  not discussed today Instructed to monitor blood sugars at least once daily. Patient has Dexcom G6 monitoring system but has not started using yet.  Patient identified as a good candidate for a GLP-1 receptor agonist given reduction in cardiovascular disease, slowed chronic kidney disease progression, low risk of hypoglycemia, increased satiety, and weight loss. May be able to improve blood glucose and decrease insulin requirements with a low risk of hypoglycemia. Will defer to endocrinology.  Continue current insulin regimen per endocrinology  Hypertension: Blood pressure under good control. Blood pressure is at goal of <140/90 mmHg per 2022 AAFP guidelines. Current medications: benazepril 20 mg by mouth once daily, amlodipine 5 mg by mouth once daily, and torsemide 40-60 mg daily (alternating) + potassium repletion 20 mEq twice daily  Intolerances: none Taking medications as directed: yes Side effects thought to be attributed to current medication regimen: no Current home blood pressure: not discussed today Continue current  medications as above Encourage dietary sodium restriction/DASH diet Recommend home blood pressure monitoring to discuss at next visit  Hyperlipidemia (follows with Dr. Harl Bowie): Controlled. LDL at goal of <70 due to very high risk given diabetes + at least 1 additional major risk factor (hypertension and chronic kidney disease (CKD)) per 2020 AACE/ACE guidelines and TG at goal of <150 per 2020 AACE/ACE guidelines. Current medications: atorvastatin 40 mg by mouth once daily Intolerances: none Taking medications as directed: yes Side effects thought to be attributed to current medication regimen: no Continue atorvastatin 40 mg by mouth once daily Discussed need for medication compliance  Chronic Kidney Disease Stage 3b - GFR 30-44 (Moderate to Severely Reduced Function) - follows with Dr. Theador Hawthorne: Appropriately managed Current medications: benazepril 20 mg by mouth once daily, atorvastatin 40 mg by mouth once daily, and torsemide 40-60 mg daily (alternating) + potassium repletion 20 mEq twice daily  Intolerances: none Taking medications as directed: yes Side effects thought to be attributed to current medication regimen: no Most recent GFR: 32 mL/min Most recent microalbumin: 214 (04/07/21) Continue current medications as above Recommend adequate hydration  Discussed need for improved control of blood sugar Continue follow-up with nephrology   Patient Goals/Self-Care Activities Patient will:  Focus on medication adherence by keeping up with prescription refills and either using a pill box or reminders to take your medications at the prescribed times Check blood sugar once a day at the following times: fasting (at least 8 hours since last food consumption) and whenever patient experiences symptoms of hypo/hyperglycemia, document, and provide at future appointments Check blood pressure at least once daily, document, and provide at future appointments  Follow Up Plan: Next PCP appointment  scheduled for: 02/20/22       Medication Assistance: None required.  Patient affirms current coverage meets needs.  Patient's preferred pharmacy is:  [facility], [place] [address] [place] [address] Phone: [phone] Fax: [phone]  [facility], [place]  [place] [address] Phone: [phone] Fax: [phone] - [facility], [place] [address] Phone: [phone] Fax: [phone]  [facility] [address], [place] [address] Phone: [phone] Fax: [phone]  Follow Up:  Patient agrees to Care Plan and Follow-up.  Plan: Next PCP appointment scheduled for: 02/20/22  Kennon Holter, PharmD, [place], CPP Clinical Pharmacist Practitioner [facility] [phone]

## 2021-11-22 ENCOUNTER — Ambulatory Visit (HOSPITAL_COMMUNITY): Payer: Medicare Other

## 2021-11-30 ENCOUNTER — Telehealth: Payer: Self-pay

## 2021-11-30 NOTE — Telephone Encounter (Signed)
Patient called said her insides hurt, what should she do?  Saying It has been happening all the time and the pain comes and it really hurts, saying her intestines hurt. Please contact patient back at [phone] ?

## 2021-11-30 NOTE — Telephone Encounter (Signed)
Has new pt GI appt but not until 05/03. She can do a telephone visit with Dr Moshe Cipro when available  ?

## 2021-11-30 NOTE — Telephone Encounter (Signed)
Left message with spouse [name], she was asleep and will call us back.  ?

## 2021-12-01 ENCOUNTER — Encounter: Payer: Self-pay | Admitting: Family Medicine

## 2021-12-01 ENCOUNTER — Other Ambulatory Visit: Payer: Self-pay | Admitting: Endocrinology

## 2021-12-01 ENCOUNTER — Ambulatory Visit (INDEPENDENT_AMBULATORY_CARE_PROVIDER_SITE_OTHER): Payer: Medicare Other | Admitting: Family Medicine

## 2021-12-01 ENCOUNTER — Other Ambulatory Visit: Payer: Self-pay

## 2021-12-01 VITALS — BP 130/70 | HR 77 | Resp 16 | Ht 68.0 in | Wt 254.1 lb

## 2021-12-01 DIAGNOSIS — N184 Chronic kidney disease, stage 4 (severe): Secondary | ICD-10-CM

## 2021-12-01 DIAGNOSIS — E785 Hyperlipidemia, unspecified: Secondary | ICD-10-CM

## 2021-12-01 DIAGNOSIS — E1169 Type 2 diabetes mellitus with other specified complication: Secondary | ICD-10-CM | POA: Diagnosis not present

## 2021-12-01 DIAGNOSIS — E1165 Type 2 diabetes mellitus with hyperglycemia: Secondary | ICD-10-CM

## 2021-12-01 DIAGNOSIS — R269 Unspecified abnormalities of gait and mobility: Secondary | ICD-10-CM

## 2021-12-01 DIAGNOSIS — R1031 Right lower quadrant pain: Secondary | ICD-10-CM

## 2021-12-01 DIAGNOSIS — F419 Anxiety disorder, unspecified: Secondary | ICD-10-CM

## 2021-12-01 DIAGNOSIS — Z794 Long term (current) use of insulin: Secondary | ICD-10-CM | POA: Diagnosis not present

## 2021-12-01 DIAGNOSIS — R109 Unspecified abdominal pain: Secondary | ICD-10-CM | POA: Insufficient documentation

## 2021-12-01 DIAGNOSIS — R1030 Lower abdominal pain, unspecified: Secondary | ICD-10-CM | POA: Insufficient documentation

## 2021-12-01 DIAGNOSIS — E1122 Type 2 diabetes mellitus with diabetic chronic kidney disease: Secondary | ICD-10-CM | POA: Diagnosis not present

## 2021-12-01 DIAGNOSIS — L84 Corns and callosities: Secondary | ICD-10-CM

## 2021-12-01 DIAGNOSIS — R1084 Generalized abdominal pain: Secondary | ICD-10-CM

## 2021-12-01 DIAGNOSIS — N1832 Chronic kidney disease, stage 3b: Secondary | ICD-10-CM

## 2021-12-01 DIAGNOSIS — F32A Depression, unspecified: Secondary | ICD-10-CM

## 2021-12-01 DIAGNOSIS — I10 Essential (primary) hypertension: Secondary | ICD-10-CM | POA: Diagnosis not present

## 2021-12-01 DIAGNOSIS — R32 Unspecified urinary incontinence: Secondary | ICD-10-CM

## 2021-12-01 NOTE — Patient Instructions (Signed)
F/U in 4 months, call if you need me sooner ? ? You are referred for abdominal scan and also to Podiatry ? ?PLEASE  wear shoes at all times and check your feet regularly ? ?Blood pressure and cholesterol are good and blood suagr is improving ? ? ?Think about what you will eat, plan ahead. ?Choose " clean, green, fresh or frozen" over canned, processed or packaged foods which are more sugary, salty and fatty. ?70 to 75% of food eaten should be vegetables and fruit. ?Three meals at set times with snacks allowed between meals, but they must be fruit or vegetables. ?Aim to eat over a 12 hour period , example 7 am to 7 pm, and STOP after  your last meal of the day. ?Drink water,generally about 64 ounces per day, no other drink is as healthy. Fruit juice is best enjoyed in a healthy way, by EATING the fruit. ? ? ?It is important that you exercise regularly at least 30 minutes 5 times a week. If you develop chest pain, have severe difficulty breathing, or feel very tired, stop exercising immediately and seek medical attention  ? ? ?Marland KitchenThanks for choosing [facility], we consider it a privelige to serve you. ? ? ?

## 2021-12-01 NOTE — Assessment & Plan Note (Signed)
Chronic RLQ painalso left flank pain , obtain scan ?

## 2021-12-01 NOTE — Progress Notes (Signed)
? ?Laura Atkins     MRN: [id number]      DOB: 1940-11-28 ? ? ?HPI ?[name] is here for follow up and re-evaluation of chronic medical conditions, medication management and review of any available recent lab and radiology data.  ?Preventive health is updated, specifically  Cancer screening and Immunization.   ?Questions or concerns regarding consultations or procedures which the PT has had in the interim are  addressed. ?The PT denies any adverse reactions to current medications since the last visit.  ?C/o persitent primarily RLQ pain , no rctal blood or change in stool ?Denies polyuria, polydipsia, blurred vision , or hypoglycemic episodes. ?States blood sugar is "allover the place" ?ROS ?Denies recent fever or chills. ?Denies sinus pressure, nasal congestion, ear pain or sore throat. ?Denies chest congestion, productive cough or wheezing. ?Denies chest pains, palpitations and leg swelling ?Denies , nausea, vomiting,diarrhea or constipation.   ?Denies dysuria, frequency, hesitancy. ?Chronic  joint pain, swelling and limitation in mobility. ?Denies headaches, seizures, numbness, or tingling. ?Denies uncontrolled depression, anxiety or insomnia. ?Denies skin break down or rash. ? ? ?PE ? ?BP 130/70   Pulse 77   Resp 16   Ht 5\' 8"  (1.727 m)   Wt 254 lb 1.9 oz (115.3 kg)   SpO2 96%   BMI 38.64 kg/m?  ? ?Patient alert and oriented and in no cardiopulmonary distress. ? ?HEENT: No facial asymmetry, EOMI,     Neck supple . ? ?Chest: Clear to auscultation bilaterally. ? ?CVS: S1, S2 no murmurs, no S3.Regular rate. ? ?ABD: obese, RLQ tenderness to deep palpation, no guarding or rebound, no masses or organomegaly palpated,  ? ?Ext: No edema ? ?MS: Adequate ROM spine, shoulders, hips and knees. ? ?Skin: Intact, no ulcerations or rash noted. ? ?Psych: Good eye contact, normal affect. Memory intact not anxious or depressed appearing. ? ?CNS: CN 2-12 intact, power,  normal throughout.no focal deficits  noted. ? ? ?Assessment & Plan ? ?Abdominal pain ?Chronic RLQ painalso left flank pain , obtain scan ? ?Type 2 diabetes mellitus with hyperglycemia (HCC) ?[name] is reminded of the importance of commitment to daily physical activity for 30 minutes or more, as able and the need to limit carbohydrate intake to 30 to 60 grams per meal to help with blood sugar control.  ? ?The need to take medication as prescribed, test blood sugar as directed, and to call between visits if there is a concern that blood sugar is uncontrolled is also discussed.  ? ?[name] is reminded of the importance of daily foot exam, annual eye examination, and good blood sugar, blood pressure and cholesterol control. ? ?Diabetic Labs Latest Ref Rng & Units 10/24/2021 04/07/2021 11/17/2020 06/30/2020 06/03/2020  ?HbA1c 0.0 - 7.0 % 7.8(A) 10.0(H) 9.5(A) 8.3(A) -  ?Microalbumin mg/dL - - - - -  ?Micro/Creat Ratio 0.0 - 30.0 mg/g creat - - - - -  ?Chol 100 - 199 mg/dL 153 144 - - -  ?HDL >39 mg/dL 60 58 - - -  ?Calc LDL 0 - 99 mg/dL 70 66 - - -  ?Triglycerides 0 - 149 mg/dL 136 111 - - -  ?Creatinine 0.57 - 1.00 mg/dL 1.65(H) 1.55(H) - - 1.13(H)  ? ?BP/Weight 12/01/2021 11/21/2021 10/24/2021 10/10/2021 09/05/2021 08/09/2021 08/08/2021  ?Systolic BP 562 563 893 734 154 109 159  ?Diastolic BP 70 57 72 72 81 55 76  ?Wt. (Lbs) 254.12 255.07 255 257 - 257 256.8  ?BMI 38.64 38.78 38.77 39.08 -  39.08 39.05  ?Some encounter information is confidential and restricted. Go to Review Flowsheets activity to see all data.  ? ?Foot/eye exam completion dates Latest Ref Rng & Units 12/01/2021 02/15/2021  ?Eye Exam No Retinopathy - Retinopathy(A)  ?Foot Form Completion - Done -  ? ? ? ? ?Managed by Endo and improving. Need podiatry care, referred ? ?Morbid obesity ([place]) ? ?Patient re-educated about  the importance of commitment to a  minimum of 150 minutes of exercise per week as able. ? ?The importance of healthy food choices with portion control discussed, as well as eating  regularly and within a 12 hour window most days. ?The need to choose "clean , green" food 50 to 75% of the time is discussed, as well as to make water the primary drink and set a goal of 64 ounces water daily. ? ?  ?Weight /BMI 12/01/2021 11/21/2021 10/24/2021  ?WEIGHT 254 lb 1.9 oz 255 lb 1.2 oz 255 lb  ?HEIGHT 5\' 8"  5\' 8"  5\' 8"   ?BMI 38.64 kg/m2 38.78 kg/m2 38.77 kg/m2  ?Some encounter information is confidential and restricted. Go to Review Flowsheets activity to see all data.  ? ? ? ? ?Essential hypertension ?Controlled, no change in medication ?DASH diet and commitment to daily physical activity for a minimum of 30 minutes discussed and encouraged, as a part of hypertension management. ?The importance of attaining a healthy weight is also discussed. ? ?BP/Weight 12/01/2021 11/21/2021 10/24/2021 10/10/2021 09/05/2021 08/09/2021 08/08/2021  ?Systolic BP 295 284 132 440 154 109 159  ?Diastolic BP 70 57 72 72 81 55 76  ?Wt. (Lbs) 254.12 255.07 255 257 - 257 256.8  ?BMI 38.64 38.78 38.77 39.08 - 39.08 39.05  ?Some encounter information is confidential and restricted. Go to Review Flowsheets activity to see all data.  ? ? ? ? ? ?Anxiety and depression ?Managed by Psych , controlled on meds ? ?Chronic kidney disease, stage 4 (severe) (HCC) ?Followed  By Nephrology ? ?Hyperlipidemia associated with type 2 diabetes mellitus ([place]) ?Hyperlipidemia:Low fat diet discussed and encouraged. ? ? ?Lipid Panel  ?Lab Results  ?Component Value Date  ? CHOL 153 10/24/2021  ? HDL 60 10/24/2021  ? [place] 70 10/24/2021  ? TRIG 136 10/24/2021  ? CHOLHDL 2.6 10/24/2021  ? ?Controlled, no change in medication ? ? ? ? ?Urinary incontinence ?continue daily vesicare ? ?UNSTEADY GAIT ?Home safety reviewed with patient and her [place] daughter, no falls reported' ?

## 2021-12-02 ENCOUNTER — Encounter: Payer: Self-pay | Admitting: Family Medicine

## 2021-12-02 NOTE — Assessment & Plan Note (Signed)
Managed by Psych, controlled on meds 

## 2021-12-02 NOTE — Assessment & Plan Note (Signed)
Followed  By Nephrology ?

## 2021-12-02 NOTE — Assessment & Plan Note (Signed)
Hyperlipidemia:Low fat diet discussed and encouraged. ? ? ?Lipid Panel  ?Lab Results  ?Component Value Date  ? CHOL 153 10/24/2021  ? HDL 60 10/24/2021  ? [place] 70 10/24/2021  ? TRIG 136 10/24/2021  ? CHOLHDL 2.6 10/24/2021  ? ?Controlled, no change in medication ? ? ? ?

## 2021-12-02 NOTE — Assessment & Plan Note (Signed)
Laura Atkins is reminded of the importance of commitment to daily physical activity for 30 minutes or more, as able and the need to limit carbohydrate intake to 30 to 60 grams per meal to help with blood sugar control.  ? ?The need to take medication as prescribed, test blood sugar as directed, and to call between visits if there is a concern that blood sugar is uncontrolled is also discussed.  ? ?Laura Atkins is reminded of the importance of daily foot exam, annual eye examination, and good blood sugar, blood pressure and cholesterol control. ? ?Diabetic Labs Latest Ref Rng & Units 10/24/2021 04/07/2021 11/17/2020 06/30/2020 06/03/2020  ?HbA1c 0.0 - 7.0 % 7.8(A) 10.0(H) 9.5(A) 8.3(A) -  ?Microalbumin mg/dL - - - - -  ?Micro/Creat Ratio 0.0 - 30.0 mg/g creat - - - - -  ?Chol 100 - 199 mg/dL 153 144 - - -  ?HDL >39 mg/dL 60 58 - - -  ?Calc LDL 0 - 99 mg/dL 70 66 - - -  ?Triglycerides 0 - 149 mg/dL 136 111 - - -  ?Creatinine 0.57 - 1.00 mg/dL 1.65(H) 1.55(H) - - 1.13(H)  ? ?BP/Weight 12/01/2021 11/21/2021 10/24/2021 10/10/2021 09/05/2021 08/09/2021 08/08/2021  ?Systolic BP 638 756 433 295 154 109 159  ?Diastolic BP 70 57 72 72 81 55 76  ?Wt. (Lbs) 254.12 255.07 255 257 - 257 256.8  ?BMI 38.64 38.78 38.77 39.08 - 39.08 39.05  ?Some encounter information is confidential and restricted. Go to Review Flowsheets activity to see all data.  ? ?Foot/eye exam completion dates Latest Ref Rng & Units 12/01/2021 02/15/2021  ?Eye Exam No Retinopathy - Retinopathy(A)  ?Foot Form Completion - Done -  ? ? ? ? ?Managed by Endo and improving. Need podiatry care, referred ?

## 2021-12-02 NOTE — Assessment & Plan Note (Signed)
continue daily vesicare ?

## 2021-12-02 NOTE — Assessment & Plan Note (Signed)
Home safety reviewed with patient and her [place] daughter, no falls reported ?

## 2021-12-02 NOTE — Assessment & Plan Note (Signed)
Controlled, no change in medication ?DASH diet and commitment to daily physical activity for a minimum of 30 minutes discussed and encouraged, as a part of hypertension management. ?The importance of attaining a healthy weight is also discussed. ? ?BP/Weight 12/01/2021 11/21/2021 10/24/2021 10/10/2021 09/05/2021 08/09/2021 08/08/2021  ?Systolic BP 972 820 601 561 154 109 159  ?Diastolic BP 70 57 72 72 81 55 76  ?Wt. (Lbs) 254.12 255.07 255 257 - 257 256.8  ?BMI 38.64 38.78 38.77 39.08 - 39.08 39.05  ?Some encounter information is confidential and restricted. Go to Review Flowsheets activity to see all data.  ? ? ? ? ?

## 2021-12-02 NOTE — Assessment & Plan Note (Signed)
?  Patient re-educated about  the importance of commitment to a  minimum of 150 minutes of exercise per week as able. ? ?The importance of healthy food choices with portion control discussed, as well as eating regularly and within a 12 hour window most days. ?The need to choose "clean , green" food 50 to 75% of the time is discussed, as well as to make water the primary drink and set a goal of 64 ounces water daily. ? ?  ?Weight /BMI 12/01/2021 11/21/2021 10/24/2021  ?WEIGHT 254 lb 1.9 oz 255 lb 1.2 oz 255 lb  ?HEIGHT 5\' 8"  5\' 8"  5\' 8"   ?BMI 38.64 kg/m2 38.78 kg/m2 38.77 kg/m2  ?Some encounter information is confidential and restricted. Go to Review Flowsheets activity to see all data.  ? ? ? ?

## 2021-12-04 ENCOUNTER — Telehealth: Payer: Self-pay

## 2021-12-04 NOTE — Telephone Encounter (Signed)
Patient called still having a lot of pain since her last visit, it does not come all the time, but when it does come its painful.  Please call patient back. [phone]. Patient wants to know why she having pain. ?

## 2021-12-05 ENCOUNTER — Encounter (HOSPITAL_COMMUNITY): Payer: Self-pay

## 2021-12-05 ENCOUNTER — Encounter (HOSPITAL_COMMUNITY)
Admission: RE | Admit: 2021-12-05 | Discharge: 2021-12-05 | Disposition: A | Discharge status: Home / Self Care | Payer: Medicare Other | Source: Ambulatory Visit | Attending: Nephrology | Admitting: Nephrology

## 2021-12-05 DIAGNOSIS — N1832 Chronic kidney disease, stage 3b: Secondary | ICD-10-CM | POA: Insufficient documentation

## 2021-12-05 DIAGNOSIS — D631 Anemia in chronic kidney disease: Secondary | ICD-10-CM | POA: Diagnosis not present

## 2021-12-05 LAB — POCT HEMOGLOBIN-HEMACUE: Hemoglobin: 8.7 g/dL — ABNORMAL LOW (ref 12.0–15.0)

## 2021-12-05 MED ORDER — EPOETIN ALFA-EPBX 10000 UNIT/ML IJ SOLN
4000.0000 [IU] | Freq: Once | INTRAMUSCULAR | Status: AC
  Administered 2021-12-05: 4000 [IU] via SUBCUTANEOUS
  Filled 2021-12-05: qty 1

## 2021-12-05 NOTE — Telephone Encounter (Signed)
She doesn't know why she is having pain so she ordered a CT abdomen that needs to be scheduled. ?

## 2021-12-06 ENCOUNTER — Other Ambulatory Visit: Payer: Self-pay | Admitting: Family Medicine

## 2021-12-06 NOTE — Telephone Encounter (Signed)
Scheduled for CT scan and patient aware  ?

## 2021-12-07 ENCOUNTER — Telehealth: Payer: Self-pay | Admitting: Nurse Practitioner

## 2021-12-07 ENCOUNTER — Ambulatory Visit: Payer: Medicare Other | Admitting: Nurse Practitioner

## 2021-12-07 ENCOUNTER — Other Ambulatory Visit: Payer: Self-pay

## 2021-12-07 NOTE — Patient Instructions (Incomplete)
Diabetes Mellitus Emergency Preparedness Plan ?A diabetes emergency preparedness plan is a checklist to make sure you have everything you need to manage your diabetes in case of an emergency, such as an evacuation, natural disaster, national security emergency, or pandemic lockdown. ?Managing your diabetes is something you have to do all day every day. The American Diabetes Association and the American College of Endocrinology both recommend putting together an emergency diabetes kit. Your kit should include important information and documents as well as all the supplies you will need to manage your diabetes for at least 1 week. Store it in a portable, waterproof bag or container. The best time to start making your emergency kit is now. ?How to make your emergency kit ?Collect information and documents ?Include the following information and documents in your kit: ?The type of diabetes you have. ?A copy of your health insurance cards and photo ID. ?A list of all your other medical conditions, allergies, and surgeries. ?A list of all your medicines and doses with the contact information for your pharmacy. Ask your health care provider for a list of your current medicines. ?Any recent lab results, including your latest hemoglobin A1C (HbA1C). ?The make, model, and serial number of your insulin pump, if you use one. Also include contact information for the manufacturer. ?Contact information for people who should be notified in case of an emergency. Include your health care provider's name, address, and phone number. ?Collect diabetes care items ?Include the following diabetes care items in your kit: ?At least a 1-week supply of: ?Oral medicines. ?Insulin. ?Blood glucose testing supplies. These include testing strips, lancets, and extra batteries for your blood glucose monitor and pump. ?A charger for the continuous glucose monitor (CGM) receiver and pump. ?Any extra supplies needed for your CGM or pump. ?A supply of  glucagon, glucose tablets, juice, soda, or hard candy in case of hypoglycemia. ?Coolers or cold packs. ?A safe container for syringes, needles, and lancets. ? ?Other preparations ?Other things to consider doing as part of your emergency plan: ?Make sure that your mobile phone is charged and that you have an extra charger, cable, or batteries. ?Choose a meeting place for family members. ?Wear a medical alert or ID bracelet. ?If you have a child with diabetes, make sure your child's school has a copy of his or her emergency plan, including the name of the staff member who will assist your child. ?Where to find more information ?American Diabetes Association: www.diabetes.org ?Centers for Disease Control and Prevention: blogs.cdc.gov ?Summary ?A diabetes emergency preparedness plan is a checklist to make sure you have everything you need in case of an emergency. ?Your kit should include important information and documents as well as all the supplies you will need to manage your condition for at least 1 week. ?Store your kit in a portable, waterproof bag or container. ?The best time to start making your emergency kit is now. ?This information is not intended to replace advice given to you by your health care provider. Make sure you discuss any questions you have with your health care provider. ?Document Revised: 03/17/2020 Document Reviewed: 03/17/2020 ?Elsevier Patient Education ? 2022 Elsevier Inc. ? ?

## 2021-12-07 NOTE — Telephone Encounter (Signed)
Patient came in with her grand daughter for her appt. I asked if the patient still had [place] or [facility] Medicare because the [facility] was showing not active but the [employer] was populating e-verified. She stated to me several times that she had [employer]. I told her she should call her insurance and see what is going on and she resch her appt ?

## 2021-12-12 ENCOUNTER — Ambulatory Visit: Payer: Medicare HMO | Admitting: Cardiology

## 2021-12-12 ENCOUNTER — Other Ambulatory Visit: Payer: Self-pay | Admitting: *Deleted

## 2021-12-12 ENCOUNTER — Ambulatory Visit: Payer: Medicare Other | Admitting: Family Medicine

## 2021-12-12 ENCOUNTER — Other Ambulatory Visit: Payer: Self-pay | Admitting: Cardiology

## 2021-12-12 MED ORDER — TORSEMIDE 20 MG PO TABS: ORAL_TABLET | ORAL | 0 refills | Status: DC

## 2021-12-14 ENCOUNTER — Ambulatory Visit (HOSPITAL_COMMUNITY): Payer: Medicare Other

## 2021-12-14 ENCOUNTER — Other Ambulatory Visit: Payer: Self-pay

## 2021-12-14 ENCOUNTER — Encounter: Payer: Self-pay | Admitting: Nurse Practitioner

## 2021-12-14 ENCOUNTER — Ambulatory Visit (INDEPENDENT_AMBULATORY_CARE_PROVIDER_SITE_OTHER): Payer: Medicare Other | Admitting: Nurse Practitioner

## 2021-12-14 VITALS — BP 137/69 | HR 75 | Ht 68.0 in | Wt 258.0 lb

## 2021-12-14 DIAGNOSIS — E782 Mixed hyperlipidemia: Secondary | ICD-10-CM

## 2021-12-14 DIAGNOSIS — E1122 Type 2 diabetes mellitus with diabetic chronic kidney disease: Secondary | ICD-10-CM

## 2021-12-14 DIAGNOSIS — I10 Essential (primary) hypertension: Secondary | ICD-10-CM

## 2021-12-14 DIAGNOSIS — Z794 Long term (current) use of insulin: Secondary | ICD-10-CM

## 2021-12-14 DIAGNOSIS — N1832 Chronic kidney disease, stage 3b: Secondary | ICD-10-CM | POA: Diagnosis not present

## 2021-12-14 MED ORDER — TRESIBA FLEXTOUCH 200 UNIT/ML ~~LOC~~ SOPN: 12.0000 [IU] | PEN_INJECTOR | Freq: Every evening | SUBCUTANEOUS | 3 refills | Status: DC

## 2021-12-14 NOTE — Patient Instructions (Signed)
Diabetes Mellitus Emergency Preparedness Plan ?A diabetes emergency preparedness plan is a checklist to make sure you have everything you need to manage your diabetes in case of an emergency, such as an evacuation, natural disaster, national security emergency, or pandemic lockdown. ?Managing your diabetes is something you have to do all day every day. The American Diabetes Association and the American College of Endocrinology both recommend putting together an emergency diabetes kit. Your kit should include important information and documents as well as all the supplies you will need to manage your diabetes for at least 1 week. Store it in a portable, waterproof bag or container. The best time to start making your emergency kit is now. ?How to make your emergency kit ?Collect information and documents ?Include the following information and documents in your kit: ?The type of diabetes you have. ?A copy of your health insurance cards and photo ID. ?A list of all your other medical conditions, allergies, and surgeries. ?A list of all your medicines and doses with the contact information for your pharmacy. Ask your health care provider for a list of your current medicines. ?Any recent lab results, including your latest hemoglobin A1C (HbA1C). ?The make, model, and serial number of your insulin pump, if you use one. Also include contact information for the manufacturer. ?Contact information for people who should be notified in case of an emergency. Include your health care provider's name, address, and phone number. ?Collect diabetes care items ?Include the following diabetes care items in your kit: ?At least a 1-week supply of: ?Oral medicines. ?Insulin. ?Blood glucose testing supplies. These include testing strips, lancets, and extra batteries for your blood glucose monitor and pump. ?A charger for the continuous glucose monitor (CGM) receiver and pump. ?Any extra supplies needed for your CGM or pump. ?A supply of  glucagon, glucose tablets, juice, soda, or hard candy in case of hypoglycemia. ?Coolers or cold packs. ?A safe container for syringes, needles, and lancets. ? ?Other preparations ?Other things to consider doing as part of your emergency plan: ?Make sure that your mobile phone is charged and that you have an extra charger, cable, or batteries. ?Choose a meeting place for family members. ?Wear a medical alert or ID bracelet. ?If you have a child with diabetes, make sure your child's school has a copy of his or her emergency plan, including the name of the staff member who will assist your child. ?Where to find more information ?American Diabetes Association: www.diabetes.org ?Centers for Disease Control and Prevention: blogs.cdc.gov ?Summary ?A diabetes emergency preparedness plan is a checklist to make sure you have everything you need in case of an emergency. ?Your kit should include important information and documents as well as all the supplies you will need to manage your condition for at least 1 week. ?Store your kit in a portable, waterproof bag or container. ?The best time to start making your emergency kit is now. ?This information is not intended to replace advice given to you by your health care provider. Make sure you discuss any questions you have with your health care provider. ?Document Revised: 03/17/2020 Document Reviewed: 03/17/2020 ?Elsevier Patient Education ? 2022 Elsevier Inc. ? ?

## 2021-12-14 NOTE — Progress Notes (Signed)
? ?                                                    Endocrinology Follow Up Note  ?     12/14/2021, 2:32 PM ? ? ?Subjective:  ? ? Patient ID: Laura Atkins, female    DOB: 1941-03-02.  ?Laura Atkins is being seen in follow up after being seen in consultation for management of currently uncontrolled symptomatic diabetes requested by  Fayrene Helper, MD. ? ? ?Past Medical History:  ?Diagnosis Date  ? Alpha thalassemia trait 01/26/2010  ? 02/2012: Nl CBC ex H&H-10.7/34.8, MCV-69   ? Anemia   ? Anxiety   ? Anxiety and depression   ? Cellulitis 05/09/2017  ? Depression   ? Diabetes mellitus   ? Foot pain, right 04/30/2013  ? GERD (gastroesophageal reflux disease)   ? Headache(784.0)   ? Hyperlipidemia   ? Hypertension   ? Iron deficiency 01/21/2017  ? Microcytic anemia 01/26/2010  ? 02/2012: Nl CBC ex H&H-10.7/34.8, MCV-69   ? NECK PAIN, CHRONIC 10/21/2008  ? +chronic back pain    ? Obesity   ? Obstructive sleep apnea   ? Osteoarthritis   ? Left knee; right shoulder; chronic neck and back pain  ? Pruritus   ? PVD (peripheral vascular disease) ([place]) 01/28/2014  ? Seizures ([place])   ? Shoulder pain, right 04/14/2015  ? Tremor   ? This started months ago after her seizure progressing to very poor hand writing  ? Urinary incontinence   ? UTI (urinary tract infection) 01/18/2013  ? ? ?Past Surgical History:  ?Procedure Laterality Date  ? ABDOMINAL HYSTERECTOMY    ? BREAST EXCISIONAL BIOPSY    ? Left; cyst  ? CATARACT EXTRACTION Right   ? 12/2017  ? CATARACT EXTRACTION W/ INTRAOCULAR LENS IMPLANT Left 09/07/2013  ? CHOLECYSTECTOMY    ? COLONOSCOPY    ? COLONOSCOPY N/A 07/20/2015  ? Procedure: COLONOSCOPY;  Surgeon: Rogene Houston, MD;  [facility]: [facility];  Service: Endoscopy;  Laterality: N/A;  930  ? EYE SURGERY Left 09/07/2013  ? cataract  ? ? ?Social History  ? ?Socioeconomic History  ? Marital status: Married  ?  Spouse name: saunders  ? Number of children: 5  ? Years of education: 17  ? Highest education  level: Not on file  ?Occupational History  ? Occupation: Disabled   ?  Employer: RETIRED  ?Tobacco Use  ? Smoking status: Never  ? Smokeless tobacco: Never  ?Vaping Use  ? Vaping Use: Never used  ?Substance and Sexual Activity  ? Alcohol use: No  ?  Alcohol/week: 0.0 standard drinks  ? Drug use: No  ? Sexual activity: Yes  ?  Birth control/protection: Surgical  ?Other Topics Concern  ? Not on file  ?Social History Narrative  ? 07/27/20 Patient lives at home with her husband [name] and dgtrIvin Booty.  Patient is retired.   ? Right handed.   ? Five Children.   ? Caffeine- 2 daily  ? ?Social Determinants of Health  ? ?Financial Resource Strain: Not on file  ?Food Insecurity: No Food Insecurity  ? Worried About Charity fundraiser in the Last Year: Never true  ? Ran Out of Food in the Last Year: Never true  ?Transportation Needs: No Transportation Needs  ?  Lack of Transportation (Medical): No  ? Lack of Transportation (Non-Medical): No  ?Physical Activity: Inactive  ? Days of Exercise per Week: 0 days  ? Minutes of Exercise per Session: 0 min  ?Stress: No Stress Concern Present  ? Feeling of Stress : Not at all  ?Social Connections: Moderately Integrated  ? Frequency of Communication with Friends and Family: More than three times a week  ? Frequency of Social Gatherings with Friends and Family: More than three times a week  ? Attends Religious Services: More than 4 times per year  ? Active Member of Clubs or Organizations: No  ? Attends Archivist Meetings: Never  ? Marital Status: Married  ? ? ?Family History  ?Problem Relation Age of Onset  ? Lung cancer Mother 70  ? Kidney disease Father   ? Diabetes Sister   ? Keloids Brother   ? ADD / ADHD Grandchild   ? Bipolar disorder Grandchild   ? Bipolar disorder Daughter   ? Seizures Daughter   ? Heart disease Daughter   ? Kidney disease Son   ? Neuropathy Son   ? Kidney disease Son   ? Edema Daughter   ? Breast cancer Daughter 73  ? Allergies Daughter   ? Alcohol  abuse Neg Hx   ? Drug abuse Neg Hx   ? ? ?Outpatient Encounter Medications as of 12/14/2021  ?Medication Sig  ? Accu-Chek FastClix Lancets MISC 1 each by Does not apply route 4 (four) times daily. Use to monitor glucose levels 4 times daily; E11.65  ? ACCU-CHEK GUIDE test strip USE TO TEST BLOOD SUGAR FOUR TIMES DAILY  ? Alcohol Swabs (B-D SINGLE USE SWABS REGULAR) PADS 1 each by Does not apply route 4 (four) times daily. Use to prep site for glucose monitoring 4 times daily; E11.65  ? amLODipine (NORVASC) 5 MG tablet TAKE 1 TABLET BY MOUTH EVERY DAY  ? aspirin 81 MG tablet Take 81 mg by mouth daily.  ? atorvastatin (LIPITOR) 40 MG tablet TAKE 1 TABLET BY MOUTH EVERY DAY  ? benazepril (LOTENSIN) 20 MG tablet TAKE 1 TABLET BY MOUTH EVERY DAY  ? Blood Glucose Monitoring Suppl (ACCU-CHEK GUIDE ME) w/Device KIT 1 each by Does not apply route 4 (four) times daily. Use to monitor glucose levels 4 times daily; E11.65  ? buPROPion (WELLBUTRIN XL) 150 MG 24 hr tablet Take 1 tablet (150 mg total) by mouth every morning.  ? Cholecalciferol 125 MCG (5000 UT) TABS Take 1 tablet by mouth daily.  ? diphenhydrAMINE (BENADRYL) 25 MG tablet Take 25 mg by mouth at bedtime.  ? donepezil (ARICEPT) 5 MG tablet TAKE 1 TABLET BY MOUTH AT BEDTIME  ? ferrous sulfate 325 (65 FE) MG EC tablet Take 1 tablet by mouth 2 (two) times daily with a meal.  ? gabapentin (NEURONTIN) 400 MG capsule TAKE ONE CAPSULE BY MOUTH EVERY MORNING and TAKE ONE CAPSULE AT NOON and TAKE TWO CAPSULES AT BEDTIME  ? insulin degludec (TRESIBA FLEXTOUCH) 200 UNIT/ML FlexTouch Pen Inject 12 Units into the skin at bedtime.  ? Insulin Pen Needle (PEN NEEDLES) 31G X 5 MM MISC Use to inject insulin once daily  ? Insulin Syringe-Needle U-100 (INSULIN SYRINGE 1CC/31GX5/16") 31G X 5/16" 1 ML MISC 1 each by Does not apply route daily. Use to inject insulin daily; E11.65  ? Multiple Vitamin (MULTIVITAMIN) capsule Take 1 capsule by mouth daily.  ? omeprazole (PRILOSEC) 20 MG capsule  TAKE ONE CAPSULE BY MOUTH EVERY DAY  ?  potassium chloride SA (KLOR-CON M) 20 MEQ tablet TAKE 1 TABLET BY MOUTH TWICE DAILY  ? sertraline (ZOLOFT) 100 MG tablet Take 1 tablet (100 mg total) by mouth daily.  ? solifenacin (VESICARE) 10 MG tablet Take 1 tablet (10 mg total) by mouth daily.  ? tiZANidine (ZANAFLEX) 2 MG tablet Take 1 tablet (2 mg total) by mouth every 6 (six) hours as needed for muscle spasms.  ? torsemide (DEMADEX) 20 MG tablet TAKE 40 MG EVERY OTHER DAY ALTERNATING 60 MG EVERY OTHER DAY  ? UNABLE TO FIND Walker x 1  ?DX unsteady gait, osteoarthritis of left knee  ? UNABLE TO FIND Elevated Toliet Seat x 1 ?DX: unsteady gait, back pain, osteoarthritis of left knee  ? UNABLE TO FIND Standing upright walker x 1  ?DX M54.40, M19.90  ? UNABLE TO FIND Incontinence pads and supplies  ? UNABLE TO FIND Rollator Walker  ? UNABLE TO FIND XL Tranquility Briefs for incontinence.  ? [DISCONTINUED] insulin degludec (TRESIBA FLEXTOUCH) 200 UNIT/ML FlexTouch Pen Inject 14 Units into the skin at bedtime.  ? ?No facility-administered encounter medications on file as of 12/14/2021.  ? ? ?ALLERGIES: ?Allergies  ?Allergen Reactions  ? Penicillins Shortness Of Breath, Itching and Rash  ? Prednisone Shortness Of Breath, Itching and Rash  ? Propoxyphene N-Acetaminophen Itching and Nausea And Vomiting  ? Sulfa Antibiotics Itching and Nausea And Vomiting  ? ? ?VACCINATION STATUS: ?Immunization History  ?Administered Date(s) Administered  ? Fluad Quad(high Dose 65+) 05/20/2019  ? H1N1 07/15/2008  ? Influenza Split 06/17/2012  ? Influenza Whole 07/03/2007, 06/17/2009, 06/07/2011  ? Influenza, High Dose Seasonal PF 07/17/2018  ? Influenza,inj,Quad PF,6+ Mos 07/13/2013, 09/06/2014, 08/10/2015, 05/23/2016, 05/22/2017  ? Pneumococcal Conjugate-13 05/03/2014  ? Pneumococcal Polysaccharide-23 09/08/2010  ? Td 09/08/2010  ? Tdap 07/17/2018  ? Unspecified SARS-COV-2 Vaccination 09/25/2019, 10/26/2019  ? ? ?Diabetes ?She presents for her  follow-up diabetic visit. She has type 2 diabetes mellitus. Onset time: poor historian- was diagnosed in her early 54s. Her disease course has been improving. There are no hypoglycemic associated sym

## 2021-12-18 ENCOUNTER — Ambulatory Visit (INDEPENDENT_AMBULATORY_CARE_PROVIDER_SITE_OTHER): Payer: Medicare Other | Admitting: Podiatrist

## 2021-12-18 ENCOUNTER — Other Ambulatory Visit: Payer: Self-pay

## 2021-12-18 ENCOUNTER — Encounter: Payer: Self-pay | Admitting: Podiatrist

## 2021-12-18 DIAGNOSIS — B351 Tinea unguium: Secondary | ICD-10-CM

## 2021-12-18 DIAGNOSIS — M79676 Pain in unspecified toe(s): Secondary | ICD-10-CM

## 2021-12-18 NOTE — Patient Instructions (Signed)
I am calling in a prescription for MUPIROCIN/ BACTROBAN ointment - apply this to the left great toe daily.  You may also soak in epsom salt soaks to help speed up healing of the toe. ? ?Look for Eucerin healing Cream or  Aquaphor Ointment in a crock like container-  apply this to the tops of your feet and to the heels ?

## 2021-12-18 NOTE — Progress Notes (Signed)
Chief Complaint  Patient presents with   Nail Problem    Nail trim      HPI: Patient is 81 y.o. female who presents today for darkneing to the nails and skin of feet-  healed ulcer right heel,  healing ulcer left hallux  Patient Active Problem List   Diagnosis Date Noted   Abdominal pain 12/01/2021   Rectal pain 10/24/2021   Urge incontinence 10/10/2021   Polyneuropathy associated with underlying disease ([place]) 01/06/2021   Chronic kidney disease, stage 4 (severe) ([place]) 01/06/2021   Dementia ([place]) 07/13/2020   Cognitive deficits    Carotid bruit 05/26/2020   Hyperlipidemia associated with type 2 diabetes mellitus ([place]) 12/08/2019   Left knee pain 02/11/2017   Iron deficiency 01/21/2017   Vitamin D deficiency 10/15/2016   Urinary incontinence 10/15/2016   Limited mobility 05/27/2016   Type 2 diabetes mellitus with hyperglycemia ([place]) 02/04/2016   Sleep terror disorder 12/14/2015   Fecal incontinence 12/13/2015   Back pain of lumbar region with sciatica 04/15/2015   Spondylosis, cervical, with myelopathy 04/14/2015   OSA (obstructive sleep apnea) 04/10/2013   Peripheral neuropathy 03/12/2013   Hearing loss 11/06/2012   Seizure-like activity ([place]) 01/25/2012   UNSTEADY GAIT 11/07/2010   Alpha thalassemia ([place]) 01/26/2010   Morbid obesity ([place]) 04/21/2008   Anxiety and depression 04/21/2008   Essential hypertension 04/21/2008   GERD 04/21/2008   Osteoarthritis of left knee 04/21/2008    Current Outpatient Medications on File Prior to Visit  Medication Sig Dispense Refill   Accu-Chek FastClix Lancets MISC 1 each by Does not apply route 4 (four) times daily. Use to monitor glucose levels 4 times daily; E11.65 306 each 2   ACCU-CHEK GUIDE test strip USE TO TEST BLOOD SUGAR FOUR TIMES DAILY 200 strip 1   Alcohol Swabs (B-D SINGLE USE SWABS REGULAR) PADS 1 each by Does not apply route 4 (four) times daily. Use to prep site for glucose monitoring 4 times daily; E11.65 300 each 2    amLODipine (NORVASC) 5 MG tablet TAKE 1 TABLET BY MOUTH EVERY DAY 30 tablet 3   aspirin 81 MG tablet Take 81 mg by mouth daily.     atorvastatin (LIPITOR) 40 MG tablet TAKE 1 TABLET BY MOUTH EVERY DAY 30 tablet 2   benazepril (LOTENSIN) 20 MG tablet TAKE 1 TABLET BY MOUTH EVERY DAY 30 tablet 5   Blood Glucose Monitoring Suppl (ACCU-CHEK GUIDE ME) w/Device KIT 1 each by Does not apply route 4 (four) times daily. Use to monitor glucose levels 4 times daily; E11.65 1 kit 0   buPROPion (WELLBUTRIN XL) 150 MG 24 hr tablet Take 1 tablet (150 mg total) by mouth every morning. 90 tablet 2   Cholecalciferol 125 MCG (5000 UT) TABS Take 1 tablet by mouth daily.     diphenhydrAMINE (BENADRYL) 25 MG tablet Take 25 mg by mouth at bedtime.     donepezil (ARICEPT) 5 MG tablet TAKE 1 TABLET BY MOUTH AT BEDTIME 30 tablet 2   ferrous sulfate 325 (65 FE) MG EC tablet Take 1 tablet by mouth 2 (two) times daily with a meal.     gabapentin (NEURONTIN) 400 MG capsule TAKE ONE CAPSULE BY MOUTH EVERY MORNING and TAKE ONE CAPSULE AT NOON and TAKE TWO CAPSULES AT BEDTIME 360 capsule 1   insulin degludec (TRESIBA FLEXTOUCH) 200 UNIT/ML FlexTouch Pen Inject 12 Units into the skin at bedtime. 6 mL 3   Insulin Pen Needle (PEN NEEDLES) 31G X 5 MM MISC  Use to inject insulin once daily 100 each 4   Insulin Syringe-Needle U-100 (INSULIN SYRINGE 1CC/31GX5/16") 31G X 5/16" 1 ML MISC 1 each by Does not apply route daily. Use to inject insulin daily; E11.65 90 each 2   Multiple Vitamin (MULTIVITAMIN) capsule Take 1 capsule by mouth daily.     omeprazole (PRILOSEC) 20 MG capsule TAKE ONE CAPSULE BY MOUTH EVERY DAY 30 capsule 2   potassium chloride SA (KLOR-CON M) 20 MEQ tablet TAKE 1 TABLET BY MOUTH TWICE DAILY 60 tablet 1   sertraline (ZOLOFT) 100 MG tablet Take 1 tablet (100 mg total) by mouth daily. 90 tablet 2   solifenacin (VESICARE) 10 MG tablet Take 1 tablet (10 mg total) by mouth daily. 30 tablet 11   tiZANidine (ZANAFLEX) 2  MG tablet Take 1 tablet (2 mg total) by mouth every 6 (six) hours as needed for muscle spasms. 20 tablet 0   torsemide (DEMADEX) 20 MG tablet TAKE 40 MG EVERY OTHER DAY ALTERNATING 60 MG EVERY OTHER DAY 225 tablet 0   UNABLE TO FIND Walker x 1  DX unsteady gait, osteoarthritis of left knee 1 each 0   UNABLE TO FIND Elevated Toliet Seat x 1 DX: unsteady gait, back pain, osteoarthritis of left knee 1 each 0   UNABLE TO FIND Standing upright walker x 1  DX M54.40, M19.90 1 each 0   UNABLE TO FIND Incontinence pads and supplies 1 each 1   UNABLE TO FIND Rollator Walker 1 Product 0   UNABLE TO FIND XL Tranquility Briefs for incontinence. 1 Package 3   No current facility-administered medications on file prior to visit.    Allergies  Allergen Reactions   Penicillins Shortness Of Breath, Itching and Rash   Prednisone Shortness Of Breath, Itching and Rash   Propoxyphene N-Acetaminophen Itching and Nausea And Vomiting   Sulfa Antibiotics Itching and Nausea And Vomiting    Review of Systems No fevers, chills, nausea, muscle aches, no difficulty breathing, no calf pain, no chest pain or shortness of breath.   Physical Exam  GENERAL APPEARANCE: Alert, conversant. Appropriately groomed. No acute distress.   VASCULAR: Pedal pulses palpable DP and PT bilateral.  Capillary refill time is immediate to all digits,  Proximal to distal cooling it warm to warm.  Digital perfusion adequate.   NEUROLOGIC: sensation is intact to 5.07 monofilament at 5/5 sites bilateral.  Light touch is intact bilateral, vibratory sensation intact bilateral  MUSCULOSKELETAL: acceptable muscle strength, tone and stability bilateral.  No gross boney pedal deformities noted.  No pain, crepitus or limitation noted with foot and ankle range of motion bilateral.   DERMATOLOGIC: skin is warm, supple, and dry.  Nails are thick, discolored, dystrophic and clinically mycotic x 10..  superficial eschar is present left hallux and  right heel.  Intact and non infected.    Assessment     ICD-10-CM   1. Pain due to onychomycosis of toenail  B35.1    M79.676        Plan  Debridement of nails carried out today.  Recommended watching the areas of eschar for any redness, swelling or sign of infection. She will be seen back for follow up

## 2021-12-19 ENCOUNTER — Encounter (HOSPITAL_COMMUNITY): Payer: Self-pay

## 2021-12-19 ENCOUNTER — Encounter (HOSPITAL_COMMUNITY)
Admission: RE | Admit: 2021-12-19 | Discharge: 2021-12-19 | Disposition: A | Discharge status: Home / Self Care | Payer: Medicare Other | Source: Ambulatory Visit | Attending: Nephrology | Admitting: Nephrology

## 2021-12-19 DIAGNOSIS — D631 Anemia in chronic kidney disease: Secondary | ICD-10-CM | POA: Diagnosis not present

## 2021-12-19 DIAGNOSIS — N1832 Chronic kidney disease, stage 3b: Secondary | ICD-10-CM | POA: Diagnosis not present

## 2021-12-19 LAB — POCT HEMOGLOBIN-HEMACUE: Hemoglobin: 8.8 g/dL — ABNORMAL LOW (ref 12.0–15.0)

## 2021-12-19 MED ORDER — EPOETIN ALFA-EPBX 2000 UNIT/ML IJ SOLN: 4000.0000 [IU] | Freq: Once | INTRAMUSCULAR | Status: DC

## 2021-12-19 MED ORDER — EPOETIN ALFA-EPBX 2000 UNIT/ML IJ SOLN
INTRAMUSCULAR | Status: AC
  Administered 2021-12-19: 4000 [IU]
  Filled 2021-12-19: qty 2

## 2021-12-19 MED ORDER — EPOETIN ALFA-EPBX 3000 UNIT/ML IJ SOLN
3000.0000 [IU] | Freq: Once | INTRAMUSCULAR | Status: DC
  Filled 2021-12-19: qty 1

## 2021-12-19 MED ORDER — EPOETIN ALFA-EPBX 10000 UNIT/ML IJ SOLN: 4000.0000 [IU] | Freq: Once | INTRAMUSCULAR | Status: DC

## 2021-12-21 ENCOUNTER — Ambulatory Visit (HOSPITAL_COMMUNITY): Payer: Medicare Other

## 2021-12-25 ENCOUNTER — Ambulatory Visit (HOSPITAL_COMMUNITY)
Admission: RE | Admit: 2021-12-25 | Discharge: 2021-12-25 | Disposition: A | Discharge status: Home / Self Care | Payer: Medicare HMO | Source: Ambulatory Visit | Attending: Family Medicine | Admitting: Family Medicine

## 2021-12-25 DIAGNOSIS — Z1231 Encounter for screening mammogram for malignant neoplasm of breast: Secondary | ICD-10-CM | POA: Insufficient documentation

## 2022-01-02 ENCOUNTER — Encounter (HOSPITAL_COMMUNITY)
Admission: RE | Admit: 2022-01-02 | Discharge: 2022-01-02 | Disposition: A | Discharge status: Home / Self Care | Payer: Medicare HMO | Source: Ambulatory Visit | Attending: Nephrology | Admitting: Nephrology

## 2022-01-02 DIAGNOSIS — D631 Anemia in chronic kidney disease: Secondary | ICD-10-CM | POA: Diagnosis not present

## 2022-01-02 DIAGNOSIS — N1832 Chronic kidney disease, stage 3b: Secondary | ICD-10-CM | POA: Insufficient documentation

## 2022-01-02 LAB — POCT HEMOGLOBIN-HEMACUE: Hemoglobin: 9.4 g/dL — ABNORMAL LOW (ref 12.0–15.0)

## 2022-01-02 MED ORDER — EPOETIN ALFA-EPBX 2000 UNIT/ML IJ SOLN: 4000.0000 [IU] | Freq: Once | INTRAMUSCULAR | Status: AC

## 2022-01-02 MED ORDER — EPOETIN ALFA-EPBX 2000 UNIT/ML IJ SOLN
INTRAMUSCULAR | Status: AC
  Administered 2022-01-02: 4000 [IU] via SUBCUTANEOUS
  Filled 2022-01-02: qty 2

## 2022-01-03 ENCOUNTER — Ambulatory Visit (HOSPITAL_COMMUNITY)
Admission: RE | Admit: 2022-01-03 | Discharge: 2022-01-03 | Disposition: A | Discharge status: Home / Self Care | Payer: Medicare HMO | Source: Ambulatory Visit | Attending: Family Medicine | Admitting: Family Medicine

## 2022-01-03 DIAGNOSIS — R1031 Right lower quadrant pain: Secondary | ICD-10-CM | POA: Diagnosis not present

## 2022-01-03 DIAGNOSIS — R1084 Generalized abdominal pain: Secondary | ICD-10-CM | POA: Diagnosis not present

## 2022-01-09 ENCOUNTER — Other Ambulatory Visit: Payer: Self-pay | Admitting: Family Medicine

## 2022-01-09 ENCOUNTER — Ambulatory Visit: Payer: Medicare Other | Admitting: Urology

## 2022-01-09 NOTE — Progress Notes (Deleted)
Assessment: 1. Urge incontinence   2. History of UTI      Plan: I discussed options for management of her urge incontinence including alternative medical therapy, PTNS, sacral neuromodulation, and chemodenervation with Botox. I also discussed further evaluation with urodynamics. I provided her with information on the purewick device and advised her that unfortunately this is not covered by insurance. Trial of Vesicare 10 mg daily.  Use and side effects discussed. Continue methods to reduce the risk of UTIs including increased fluid intake, timed and double voiding, daily cranberry supplement, and daily probiotics discussed. Advised her to call with results of medication in 3-4 weeks.    Chief Complaint:  No chief complaint on file.   History of Present Illness:  Laura Atkins is a 81 y.o. year old female who is seen for further evaluation of urinary incontinence and UTI's.  She reported symptoms for 10+ years with urinary frequency, urgency, and urge incontinence.  She has used  using maxipads twice daily.  No prior medical therapy for her incontinence symptoms.  She does have a history of UTIs. Urine culture results: 10/21: E. coli 1/22: <10K colonies 2/22: <10K colonies 7/22: E. coli 8/25: E. Coli  She typically has symptoms of increased frequency and dysuria associated with UTIs.  No history of gross hematuria.  She does have chronic kidney disease and is followed by nephrology. Renal ultrasound from 05/19/2021 showed no mass or hydronephrosis involving either kidney and a small amount of layering debris in the bladder.  She was given a trial of Myrbetriq 25 mg daily at her visit on 08/08/2021.  She noted some slight improvement in her symptoms but she continue to have incontinence associated with urgency, both daytime and nighttime.  She was using maxipads 2-3 times per day.  No dysuria or gross hematuria.  Her dose of Myrbetriq was increased to 50 mg daily at her visit  on 09/05/2021.  She continued on Myrbetriq 50 mg daily but had not seen a significant improvement in her symptoms.  She continued to have frequency, urgency, and incontinence both daytime and nighttime.  No dysuria or gross hematuria.  She was interested in a purewick device. She continued to use incontinence pads daily. She was given a trial of Vesicare 10 mg daily in 1/23.  Portions of the above documentation were copied from a prior visit for review purposes only.  Past Medical History:  Past Medical History:  Diagnosis Date   Alpha thalassemia trait 01/26/2010   02/2012: Nl CBC ex H&H-10.7/34.8, MCV-69    Anemia    Anxiety    Anxiety and depression    Cellulitis 05/09/2017   Depression    Diabetes mellitus    Foot pain, right 04/30/2013   GERD (gastroesophageal reflux disease)    Headache(784.0)    Hyperlipidemia    Hypertension    Iron deficiency 01/21/2017   Microcytic anemia 01/26/2010   02/2012: Nl CBC ex H&H-10.7/34.8, MCV-69    NECK PAIN, CHRONIC 10/21/2008   +chronic back pain     Obesity    Obstructive sleep apnea    Osteoarthritis    Left knee; right shoulder; chronic neck and back pain   Pruritus    PVD (peripheral vascular disease) ([place]) 01/28/2014   Seizures ([facility])    Shoulder pain, right 04/14/2015   Tremor    This started months ago after her seizure progressing to very poor hand writing   Urinary incontinence    UTI (urinary tract infection) 01/18/2013  Past Surgical History:  Past Surgical History:  Procedure Laterality Date   ABDOMINAL HYSTERECTOMY     BREAST EXCISIONAL BIOPSY     Left; cyst   CATARACT EXTRACTION Right    12/2017   CATARACT EXTRACTION W/ INTRAOCULAR LENS IMPLANT Left 09/07/2013   CHOLECYSTECTOMY     COLONOSCOPY     COLONOSCOPY N/A 07/20/2015   Procedure: COLONOSCOPY;  Surgeon: Rogene Houston, MD;  [facility];  Service: Endoscopy;  Laterality: N/A;  930   EYE SURGERY Left 09/07/2013   cataract    Allergies:  Allergies   Allergen Reactions   Penicillins Shortness Of Breath, Itching and Rash   Prednisone Shortness Of Breath, Itching and Rash   Propoxyphene N-Acetaminophen Itching and Nausea And Vomiting   Sulfa Antibiotics Itching and Nausea And Vomiting    Family History:  Family History  Problem Relation Age of Onset   Lung cancer Mother 67   Kidney disease Father    Diabetes Sister    Keloids Brother    ADD / ADHD Grandchild    Bipolar disorder Grandchild    Bipolar disorder Daughter    Seizures Daughter    Heart disease Daughter    Kidney disease Son    Neuropathy Son    Kidney disease Son    Edema Daughter    Breast cancer Daughter 18   Allergies Daughter    Alcohol abuse Neg Hx    Drug abuse Neg Hx     Social History:  Social History   Tobacco Use   Smoking status: Never   Smokeless tobacco: Never  Vaping Use   Vaping Use: Never used  Substance Use Topics   Alcohol use: No    Alcohol/week: 0.0 standard drinks   Drug use: No    ROS: Constitutional:  Negative for fever, chills, weight loss CV: Negative for chest pain, previous MI, hypertension Respiratory:  Negative for shortness of breath, wheezing, sleep apnea, frequent cough GI:  Negative for nausea, vomiting, bloody stool, GERD  Physical exam: There were no vitals taken for this visit. GENERAL APPEARANCE:  Well appearing, well developed, well nourished, NAD HEENT:  Atraumatic, normocephalic, oropharynx clear NECK:  Supple without lymphadenopathy or thyromegaly ABDOMEN:  Soft, non-tender, no masses EXTREMITIES:  Moves all extremities well, without clubbing, cyanosis, or edema NEUROLOGIC:  Alert and oriented x 3, normal gait, CN II-XII grossly intact MENTAL STATUS:  appropriate BACK:  Non-tender to palpation, No CVAT SKIN:  Warm, dry, and intact   Results: U/A dipstick

## 2022-01-16 ENCOUNTER — Encounter (HOSPITAL_COMMUNITY): Payer: Self-pay

## 2022-01-16 ENCOUNTER — Encounter (HOSPITAL_COMMUNITY)
Admission: RE | Admit: 2022-01-16 | Discharge: 2022-01-16 | Disposition: A | Discharge status: Home / Self Care | Payer: Medicare HMO | Source: Ambulatory Visit | Attending: Nephrology | Admitting: Nephrology

## 2022-01-16 DIAGNOSIS — D631 Anemia in chronic kidney disease: Secondary | ICD-10-CM | POA: Diagnosis not present

## 2022-01-16 DIAGNOSIS — N1832 Chronic kidney disease, stage 3b: Secondary | ICD-10-CM | POA: Diagnosis not present

## 2022-01-16 LAB — POCT HEMOGLOBIN-HEMACUE: Hemoglobin: 8.9 g/dL — ABNORMAL LOW (ref 12.0–15.0)

## 2022-01-16 MED ORDER — EPOETIN ALFA-EPBX 10000 UNIT/ML IJ SOLN
4000.0000 [IU] | Freq: Once | INTRAMUSCULAR | Status: AC
  Administered 2022-01-16: 4000 [IU] via SUBCUTANEOUS
  Filled 2022-01-16: qty 1

## 2022-01-18 ENCOUNTER — Ambulatory Visit (INDEPENDENT_AMBULATORY_CARE_PROVIDER_SITE_OTHER): Payer: Medicare HMO | Admitting: Family Medicine

## 2022-01-18 ENCOUNTER — Encounter: Payer: Self-pay | Admitting: Family Medicine

## 2022-01-18 DIAGNOSIS — R809 Proteinuria, unspecified: Secondary | ICD-10-CM | POA: Diagnosis not present

## 2022-01-18 DIAGNOSIS — I129 Hypertensive chronic kidney disease with stage 1 through stage 4 chronic kidney disease, or unspecified chronic kidney disease: Secondary | ICD-10-CM | POA: Diagnosis not present

## 2022-01-18 DIAGNOSIS — Z6838 Body mass index (BMI) 38.0-38.9, adult: Secondary | ICD-10-CM | POA: Diagnosis not present

## 2022-01-18 DIAGNOSIS — D638 Anemia in other chronic diseases classified elsewhere: Secondary | ICD-10-CM | POA: Diagnosis not present

## 2022-01-18 DIAGNOSIS — E1122 Type 2 diabetes mellitus with diabetic chronic kidney disease: Secondary | ICD-10-CM | POA: Diagnosis not present

## 2022-01-18 DIAGNOSIS — R1030 Lower abdominal pain, unspecified: Secondary | ICD-10-CM | POA: Diagnosis not present

## 2022-01-18 DIAGNOSIS — N189 Chronic kidney disease, unspecified: Secondary | ICD-10-CM | POA: Diagnosis not present

## 2022-01-18 MED ORDER — HYOSCYAMINE SULFATE ER 0.375 MG PO TB12: ORAL_TABLET | ORAL | 0 refills | Status: DC

## 2022-01-18 NOTE — Patient Instructions (Signed)
F/U as before, call if you need me sooner ? ?You DO have an appointment next week regarding your abdominal pain, very important that you keeep the appointment ? ?Thanks for choosing [facility], we consider it a privelige to serve you.  ? ? ?I have prescribed a limited amount of tablets for abdominal spasm to help with the pain ? ?If pain or rectal blood worsens , you need to go to the emergency room ? ?Thanks for choosing [facility], we consider it a privelige to serve you. ? ?

## 2022-01-18 NOTE — Assessment & Plan Note (Signed)
10 week h/o cramping lower abdominal pain, no cause for symptoms on imaging ?Trial of levbid for symptom controlpending GI consult in the next 1 week ?Advised ED eval f pain worsens , or rectal bleeding occurs / worsens ?

## 2022-01-18 NOTE — Progress Notes (Signed)
Virtual Visit via Telephone Note ? ?I connected with Laura Atkins on 01/18/22 at  4:40 PM EDT by telephone and verified that I am speaking with the correct person using two identifiers. ? ?Location: ?Patient: home ?Provider: office ?  ?I discussed the limitations, risks, security and privacy concerns of performing an evaluation and management service by telephone and the availability of in person appointments. I also discussed with the patient that there may be a patient responsible charge related to this service. The patient expressed understanding and agreed to proceed. ? ? ?History of Present Illness: ? ? c/o recurrent lower abdominal cramping pain, also c/o rectal blood  , looks " old" from time to time. Has had abdominal scan non diagnostic and has appt wit GI for  eval next week ?Denies nausea, vomit or change in appetite ?C/o pressure , thinks she may have hemorrhoids ?No urinary symptoms ?Observations/Objective: ?There were no vitals taken for this visit. ?Good communication with no confusion and intact memory. ?Alert and oriented x 3 ?No signs of respiratory distress during speech ? ? ?Assessment and Plan: ?Abdominal pain ?10 week h/o cramping lower abdominal pain, no cause for symptoms on imaging ?Trial of levbid for symptom controlpending GI consult in the next 1 week ?Advised ED eval f pain worsens , or rectal bleeding occurs / worsens ? ? ?Follow Up Instructions: ? ?  ?I discussed the assessment and treatment plan with the patient. The patient was provided an opportunity to ask questions and all were answered. The patient agreed with the plan and demonstrated an understanding of the instructions. ?  ?The patient was advised to call back or seek an in-person evaluation if the symptoms worsen or if the condition fails to improve as anticipated. ? ?I provided 12 minutes of non-face-to-face time during this encounter. ? ? ?Tula Nakayama, MD ? ?

## 2022-01-19 ENCOUNTER — Ambulatory Visit: Payer: Medicare HMO | Admitting: Urology

## 2022-01-23 ENCOUNTER — Ambulatory Visit (INDEPENDENT_AMBULATORY_CARE_PROVIDER_SITE_OTHER): Payer: Medicare Other | Admitting: Gastroenterology

## 2022-01-23 ENCOUNTER — Encounter (INDEPENDENT_AMBULATORY_CARE_PROVIDER_SITE_OTHER): Payer: Self-pay | Admitting: Gastroenterology

## 2022-01-23 VITALS — BP 124/65 | HR 86 | Temp 97.6°F | Ht 68.0 in | Wt 252.0 lb

## 2022-01-23 DIAGNOSIS — R1030 Lower abdominal pain, unspecified: Secondary | ICD-10-CM

## 2022-01-23 DIAGNOSIS — K921 Melena: Secondary | ICD-10-CM

## 2022-01-23 DIAGNOSIS — R195 Other fecal abnormalities: Secondary | ICD-10-CM

## 2022-01-23 DIAGNOSIS — K6289 Other specified diseases of anus and rectum: Secondary | ICD-10-CM

## 2022-01-23 DIAGNOSIS — D509 Iron deficiency anemia, unspecified: Secondary | ICD-10-CM | POA: Insufficient documentation

## 2022-01-23 DIAGNOSIS — D500 Iron deficiency anemia secondary to blood loss (chronic): Secondary | ICD-10-CM | POA: Insufficient documentation

## 2022-01-23 NOTE — Patient Instructions (Signed)
We will get you scheduled for EGD and Colonoscopy for further evaluation of your black stools, changes in stool shape and lower abdominal pain. Please let me know if you have any bright red bleeding from your rectum, worsening pain in your bottom, severe abdominal pain. ? ?Be aware that Shortness of breath, dizziness or passing out can be signs that your blood counts are severely low, and this can be an emergency, please let me know if you experience any of this.  ? ?Please have Dr. Theador Hawthorne send over lab results after labs are done tomorrow so that we can keep your chart updated. ? ?Follow up 4 months ?

## 2022-01-23 NOTE — Progress Notes (Deleted)
Patient states for the past almost 3 months. States that bowel movements are black in color and very painful. She denies any BRBPR. She sometimes has regular stools in between the black pieces. She reports that she has a lot of rectal pain when she is having a BM, pain is dull and sharp in nature, reports that pain will eventually ease off. She states that initially when started she felt constipated, still notes that stools somewhat feel hard. Has lower abdominal cramping, this sometimes eases off after she has a BM. Denies any nausea or vomiting. Denies any changes in her appetite or weight loss.  ? ?Denies previous EGD, denies any issues with heartburn or acid reflux. Denies dysphagia or odynophagia.  ? ?Denies any NSAID use. States that she was previously on iron infusions and Iron pills, however, kidney doctor took her off of supplemental iron because her iron levels were good. Decline rectal exam. Having a BM maybe every other day.  ?

## 2022-01-23 NOTE — Progress Notes (Signed)
? ?Referring Provider: Fayrene Helper, MD ?Primary Care Physician:  Fayrene Helper, MD ?Primary GI Physician: New ? ?Chief Complaint  ?Patient presents with  ? New Patient (Initial Visit)  ?  Rectal Pain Lower Abdominal Pain, having 2 to 3 bowel movements a day she states that it looks like little dark squares with dark blood in it and she has severe rectal pain during a bowel movement   ? ?HPI:   ?Laura Atkins is a 81 y.o. female with past medical history of alpha thalassemia trait, anemia, DM, GERD, HLD, HTN, iron deficiency, OSA, PVD. ? ?Patient presenting today as a new patient for rectal bleeding. ? ?Patient reports lower abdominal cramping and rectal bleeding x almost 3 months ? ?CT A/P w/o contrast done 01/03/22 No acute abnormality or explanation for abdominal pain. ?Particularly, normal appendix. Subtle capsular nodularity of the liver, suspicious for ?cirrhosis. Recommend correlation with cirrhosis risk factors. Post cholecystectomy without biliary dilatation. Last Hgb 9.1 on 01/18/22. Last iron studies 01/18/22 with iron 66, TIBC 290, Iron sat 23%, ferritin 122 ? ?Patient states for the past almost 3 months bowel movements are black in color and very painful with reported "square" shaped stools. She denies any BRBPR. She sometimes has regular stools in between the black colored stools. She reports that she has a lot of rectal pain when she is having a BM, pain is dull and sharp in nature, pain eventually eases off on its own. She states that initially when symptoms started she felt constipated, still notes that stools somewhat feel hard. Has lower abdominal cramping, this sometimes eases off after she has a BM. Denies any nausea or vomiting. Denies any changes in her appetite or weight loss. Denies any NSAID use. States that she was previously on iron infusions and Iron pills, however, kidney doctor stopped iron infusions because Iron levels were good, her granddaughter who helps provide  history states the patient is not on Iron pills at this time. She unsure of the cause of her IDA as she has not had diagnostic endoscopic evaluation for this. Having a BM maybe every other day. No GERD symptoms, dysphagia or odynophagia. She has no lightheadedness, SOB or fatigue.  ? ? ?NSAID OAC:ZYSA ?Social hx:no etoh or tobacco ?Fam hx:no crc ? ?Last Colonoscopy:07/20/15 Examination performed to cecum. ?25 mm flat ulcerated lesion next ileocecal valve suspicious for carcinoma.(Benign per biopsy) ?Multiple biopsies taken. ?Small external hemorrhoids. ? Last Endoscopy:never ? ?Recommendations:  ? ? ?Past Medical History:  ?Diagnosis Date  ? Alpha thalassemia trait 01/26/2010  ? 02/2012: Nl CBC ex H&H-10.7/34.8, MCV-69   ? Anemia   ? Anxiety   ? Anxiety and depression   ? Cellulitis 05/09/2017  ? Depression   ? Diabetes mellitus   ? Foot pain, right 04/30/2013  ? GERD (gastroesophageal reflux disease)   ? Headache(784.0)   ? Hyperlipidemia   ? Hypertension   ? Iron deficiency 01/21/2017  ? Microcytic anemia 01/26/2010  ? 02/2012: Nl CBC ex H&H-10.7/34.8, MCV-69   ? NECK PAIN, CHRONIC 10/21/2008  ? +chronic back pain    ? Obesity   ? Obstructive sleep apnea   ? Osteoarthritis   ? Left knee; right shoulder; chronic neck and back pain  ? Pruritus   ? PVD (peripheral vascular disease) ([place]) 01/28/2014  ? Seizures ([place])   ? Shoulder pain, right 04/14/2015  ? Tremor   ? This started months ago after her seizure progressing to very poor hand writing  ? Urinary  incontinence   ? UTI (urinary tract infection) 01/18/2013  ? ? ?Past Surgical History:  ?Procedure Laterality Date  ? ABDOMINAL HYSTERECTOMY    ? BREAST EXCISIONAL BIOPSY    ? Left; cyst  ? CATARACT EXTRACTION Right   ? 12/2017  ? CATARACT EXTRACTION W/ INTRAOCULAR LENS IMPLANT Left 09/07/2013  ? CHOLECYSTECTOMY    ? COLONOSCOPY    ? COLONOSCOPY N/A 07/20/2015  ? Procedure: COLONOSCOPY;  Surgeon: Rogene Houston, MD;  [facility]: [facility];  Service: Endoscopy;  Laterality: N/A;   930  ? EYE SURGERY Left 09/07/2013  ? cataract  ? ? ?Current Outpatient Medications  ?Medication Sig Dispense Refill  ? Accu-Chek FastClix Lancets MISC 1 each by Does not apply route 4 (four) times daily. Use to monitor glucose levels 4 times daily; E11.65 306 each 2  ? ACCU-CHEK GUIDE test strip USE TO TEST BLOOD SUGAR FOUR TIMES DAILY 200 strip 1  ? Alcohol Swabs (B-D SINGLE USE SWABS REGULAR) PADS 1 each by Does not apply route 4 (four) times daily. Use to prep site for glucose monitoring 4 times daily; E11.65 300 each 2  ? amLODipine (NORVASC) 5 MG tablet TAKE 1 TABLET BY MOUTH EVERY DAY 30 tablet 3  ? aspirin 81 MG tablet Take 81 mg by mouth daily.    ? atorvastatin (LIPITOR) 40 MG tablet TAKE 1 TABLET BY MOUTH EVERY DAY 30 tablet 2  ? benazepril (LOTENSIN) 20 MG tablet TAKE 1 TABLET BY MOUTH EVERY DAY 30 tablet 5  ? Blood Glucose Monitoring Suppl (ACCU-CHEK GUIDE ME) w/Device KIT 1 each by Does not apply route 4 (four) times daily. Use to monitor glucose levels 4 times daily; E11.65 1 kit 0  ? buPROPion (WELLBUTRIN XL) 150 MG 24 hr tablet Take 1 tablet (150 mg total) by mouth every morning. 90 tablet 2  ? Cholecalciferol 125 MCG (5000 UT) TABS Take 1 tablet by mouth daily.    ? diphenhydrAMINE (BENADRYL) 25 MG tablet Take 25 mg by mouth at bedtime.    ? donepezil (ARICEPT) 5 MG tablet TAKE 1 TABLET BY MOUTH AT BEDTIME 30 tablet 2  ? ferrous sulfate 325 (65 FE) MG EC tablet Take 1 tablet by mouth 2 (two) times daily with a meal.    ? gabapentin (NEURONTIN) 400 MG capsule TAKE ONE CAPSULE BY MOUTH EVERY MORNING and TAKE ONE CAPSULE AT NOON and TAKE TWO CAPSULES AT BEDTIME 360 capsule 1  ? hyoscyamine (LEVBID) 0.375 MG 12 hr tablet Take one tablet by mouth t5wo times daily as needed, for abdominal cramping 30 tablet 0  ? insulin degludec (TRESIBA FLEXTOUCH) 200 UNIT/ML FlexTouch Pen Inject 12 Units into the skin at bedtime. 6 mL 3  ? Insulin Pen Needle (PEN NEEDLES) 31G X 5 MM MISC Use to inject insulin once  daily 100 each 4  ? Insulin Syringe-Needle U-100 (INSULIN SYRINGE 1CC/31GX5/16") 31G X 5/16" 1 ML MISC 1 each by Does not apply route daily. Use to inject insulin daily; E11.65 90 each 2  ? Multiple Vitamin (MULTIVITAMIN) capsule Take 1 capsule by mouth daily.    ? omeprazole (PRILOSEC) 20 MG capsule TAKE ONE CAPSULE BY MOUTH EVERY DAY 30 capsule 2  ? potassium chloride SA (KLOR-CON M) 20 MEQ tablet TAKE 1 TABLET BY MOUTH TWICE DAILY 60 tablet 1  ? sertraline (ZOLOFT) 100 MG tablet Take 1 tablet (100 mg total) by mouth daily. 90 tablet 2  ? solifenacin (VESICARE) 10 MG tablet Take 1 tablet (10 mg  total) by mouth daily. 30 tablet 11  ? tiZANidine (ZANAFLEX) 2 MG tablet Take 1 tablet (2 mg total) by mouth every 6 (six) hours as needed for muscle spasms. 20 tablet 0  ? torsemide (DEMADEX) 20 MG tablet TAKE 40 MG EVERY OTHER DAY ALTERNATING 60 MG EVERY OTHER DAY 225 tablet 0  ? UNABLE TO FIND Walker x 1  ?DX unsteady gait, osteoarthritis of left knee 1 each 0  ? UNABLE TO FIND Elevated [place] Seat x 1 ?DX: unsteady gait, back pain, osteoarthritis of left knee 1 each 0  ? UNABLE TO FIND Standing upright walker x 1  ?DX M54.40, M19.90 1 each 0  ? UNABLE TO FIND Incontinence pads and supplies 1 each 1  ? UNABLE TO FIND Rollator Walker 1 Product 0  ? UNABLE TO FIND XL Tranquility Briefs for incontinence. 1 Package 3  ? ?No current facility-administered medications for this visit.  ? ? ?Allergies as of 01/23/2022 - Review Complete 01/18/2022  ?Allergen Reaction Noted  ? Penicillins Shortness Of Breath, Itching, and Rash 09/05/2007  ? Prednisone Shortness Of Breath, Itching, and Rash 09/05/2007  ? Propoxyphene n-acetaminophen Itching and Nausea And Vomiting 09/05/2007  ? Sulfa antibiotics Itching and Nausea And Vomiting 09/05/2007  ? ? ?Family History  ?Problem Relation Age of Onset  ? Lung cancer Mother 74  ? Kidney disease Father   ? Diabetes Sister   ? Keloids Brother   ? ADD / ADHD Grandchild   ? Bipolar disorder  Grandchild   ? Bipolar disorder Daughter   ? Seizures Daughter   ? Heart disease Daughter   ? Kidney disease Son   ? Neuropathy Son   ? Kidney disease Son   ? Edema Daughter   ? Breast cancer Daughter 51  ? Allergies D

## 2022-01-24 DIAGNOSIS — I129 Hypertensive chronic kidney disease with stage 1 through stage 4 chronic kidney disease, or unspecified chronic kidney disease: Secondary | ICD-10-CM | POA: Diagnosis not present

## 2022-01-24 DIAGNOSIS — D638 Anemia in other chronic diseases classified elsewhere: Secondary | ICD-10-CM | POA: Diagnosis not present

## 2022-01-24 DIAGNOSIS — R809 Proteinuria, unspecified: Secondary | ICD-10-CM | POA: Diagnosis not present

## 2022-01-24 DIAGNOSIS — Z6839 Body mass index (BMI) 39.0-39.9, adult: Secondary | ICD-10-CM | POA: Diagnosis not present

## 2022-01-24 DIAGNOSIS — N189 Chronic kidney disease, unspecified: Secondary | ICD-10-CM | POA: Diagnosis not present

## 2022-01-24 DIAGNOSIS — E1122 Type 2 diabetes mellitus with diabetic chronic kidney disease: Secondary | ICD-10-CM | POA: Diagnosis not present

## 2022-01-25 ENCOUNTER — Ambulatory Visit (INDEPENDENT_AMBULATORY_CARE_PROVIDER_SITE_OTHER): Payer: Medicare HMO | Admitting: Urology

## 2022-01-25 ENCOUNTER — Encounter: Payer: Self-pay | Admitting: Urology

## 2022-01-25 VITALS — BP 144/71 | HR 80

## 2022-01-25 DIAGNOSIS — N3941 Urge incontinence: Secondary | ICD-10-CM

## 2022-01-25 DIAGNOSIS — Z87440 Personal history of urinary (tract) infections: Secondary | ICD-10-CM | POA: Diagnosis not present

## 2022-01-25 DIAGNOSIS — N189 Chronic kidney disease, unspecified: Secondary | ICD-10-CM | POA: Diagnosis not present

## 2022-01-25 LAB — URINALYSIS, ROUTINE W REFLEX MICROSCOPIC
Bilirubin, UA: NEGATIVE
Glucose, UA: NEGATIVE
Ketones, UA: NEGATIVE
Leukocytes,UA: NEGATIVE
Nitrite, UA: NEGATIVE
Protein,UA: NEGATIVE
RBC, UA: NEGATIVE
Specific Gravity, UA: 1.01 (ref 1.005–1.030)
Urobilinogen, Ur: 0.2 mg/dL (ref 0.2–1.0)
pH, UA: 5.5 (ref 5.0–7.5)

## 2022-01-25 MED ORDER — GEMTESA 75 MG PO TABS: 75.0000 mg | ORAL_TABLET | Freq: Every day | ORAL | 0 refills | Status: DC

## 2022-01-25 NOTE — Progress Notes (Signed)
? ?Assessment: ?1. Urge incontinence   ?2. History of UTI   ? ? ?Plan: ?I reviewed options for management of her urge incontinence including alternative medical therapy, PTNS, sacral neuromodulation, and chemodenervation with Botox. ?I again discussed further evaluation with urodynamics. ?Continue methods to reduce the risk of UTIs including increased fluid intake, timed and double voiding, daily cranberry supplement, and daily probiotics discussed. ?Trial of Gemtesa 75 mg daily.  Samples provided. ?Advised her to call with results of medication in 1 month ?Return of office in 2 months ?  ? ?Chief Complaint:  ?Chief Complaint  ?Patient presents with  ? Urinary Incontinence  ? ? ?History of Present Illness: ? ?Laura Atkins is a 81 y.o. year old female who is seen for further evaluation of urinary incontinence and UTI's.  She reported symptoms for 10+ years with urinary frequency, urgency, and urge incontinence.  She has used  using maxipads twice daily.  No prior medical therapy for her incontinence symptoms. ? ?She does have a history of UTIs. ?Urine culture results: ?10/21: E. coli ?1/22: <10K colonies ?2/22: <10K colonies ?7/22: E. coli ?8/25: E. Coli ? ?She typically has symptoms of increased frequency and dysuria associated with UTIs.  No history of gross hematuria. ? ?She does have chronic kidney disease and is followed by nephrology. ?Renal ultrasound from 05/19/2021 showed no mass or hydronephrosis involving either kidney and a small amount of layering debris in the bladder. ? ?She was given a trial of Myrbetriq 25 mg daily at her visit on 08/08/2021.  She noted some slight improvement in her symptoms but she continue to have incontinence associated with urgency, both daytime and nighttime.  She was using maxipads 2-3 times per day.  No dysuria or gross hematuria.  Her dose of Myrbetriq was increased to 50 mg daily at her visit on 09/05/2021. ? ?She continued on Myrbetriq 50 mg daily but had not seen a  significant improvement in her symptoms.  She continued to have frequency, urgency, and incontinence both daytime and nighttime.  No dysuria or gross hematuria.  She was interested in a purewick device. ?She continued to use incontinence pads daily. ?She was given a trial of Vesicare 10 mg daily in 1/23. ? ?She returns today for follow-up.  She is no longer taking the Vesicare due to side effects of abdominal pain and difficulty voiding.  She discontinued the medication after approximately 3 weeks.  She continues to have symptoms of frequency, urgency, and urinary incontinence.  No dysuria or gross hematuria.  She continues to use incontinence pads daily.  She is also having problems with fecal incontinence.  She is undergoing GI evaluation and is scheduled for colonoscopy in the near future. ? ?Portions of the above documentation were copied from a prior visit for review purposes only. ? ?Past Medical History:  ?Past Medical History:  ?Diagnosis Date  ? Alpha thalassemia trait 01/26/2010  ? 02/2012: Nl CBC ex H&H-10.7/34.8, MCV-69   ? Anemia   ? Anxiety   ? Anxiety and depression   ? Cellulitis 05/09/2017  ? Depression   ? Diabetes mellitus   ? Foot pain, right 04/30/2013  ? GERD (gastroesophageal reflux disease)   ? Headache(784.0)   ? Hyperlipidemia   ? Hypertension   ? Iron deficiency 01/21/2017  ? Microcytic anemia 01/26/2010  ? 02/2012: Nl CBC ex H&H-10.7/34.8, MCV-69   ? NECK PAIN, CHRONIC 10/21/2008  ? +chronic back pain    ? Obesity   ? Obstructive sleep apnea   ?  Osteoarthritis   ? Left knee; right shoulder; chronic neck and back pain  ? Pruritus   ? PVD (peripheral vascular disease) ([place]) 01/28/2014  ? Seizures ([place])   ? Shoulder pain, right 04/14/2015  ? Tremor   ? This started months ago after her seizure progressing to very poor hand writing  ? Urinary incontinence   ? UTI (urinary tract infection) 01/18/2013  ? ? ?Past Surgical History:  ?Past Surgical History:  ?Procedure Laterality Date  ? ABDOMINAL HYSTERECTOMY     ? BREAST EXCISIONAL BIOPSY    ? Left; cyst  ? CATARACT EXTRACTION Right   ? 12/2017  ? CATARACT EXTRACTION W/ INTRAOCULAR LENS IMPLANT Left 09/07/2013  ? CHOLECYSTECTOMY    ? COLONOSCOPY    ? COLONOSCOPY N/A 07/20/2015  ? Procedure: COLONOSCOPY;  Surgeon: Rogene Houston, MD;  [facility];  Service: Endoscopy;  Laterality: N/A;  930  ? EYE SURGERY Left 09/07/2013  ? cataract  ? ? ?Allergies:  ?Allergies  ?Allergen Reactions  ? Penicillins Shortness Of Breath, Itching and Rash  ? Prednisone Shortness Of Breath, Itching and Rash  ? Propoxyphene N-Acetaminophen Itching and Nausea And Vomiting  ? Sulfa Antibiotics Itching and Nausea And Vomiting  ? ? ?Family History:  ?Family History  ?Problem Relation Age of Onset  ? Lung cancer Mother 48  ? Kidney disease Father   ? Diabetes Sister   ? Keloids Brother   ? ADD / ADHD Grandchild   ? Bipolar disorder Grandchild   ? Bipolar disorder Daughter   ? Seizures Daughter   ? Heart disease Daughter   ? Kidney disease Son   ? Neuropathy Son   ? Kidney disease Son   ? Edema Daughter   ? Breast cancer Daughter 25  ? Allergies Daughter   ? Alcohol abuse Neg Hx   ? Drug abuse Neg Hx   ? ? ?Social History:  ?Social History  ? ?Tobacco Use  ? Smoking status: Never  ? Smokeless tobacco: Never  ?Vaping Use  ? Vaping Use: Never used  ?Substance Use Topics  ? Alcohol use: No  ?  Alcohol/week: 0.0 standard drinks  ? Drug use: No  ? ? ?ROS: ?Constitutional:  Negative for fever, chills, weight loss ?CV: Negative for chest pain, previous MI, hypertension ?Respiratory:  Negative for shortness of breath, wheezing, sleep apnea, frequent cough ?GI:  Negative for nausea, vomiting, bloody stool, GERD ? ?Physical exam: ?BP (!) 144/71   Pulse 80  ?GENERAL APPEARANCE:  Well appearing, well developed, well nourished, NAD ?HEENT:  Atraumatic, normocephalic, oropharynx clear ?NECK:  Supple without lymphadenopathy or thyromegaly ?ABDOMEN:  Soft, non-tender, no masses ?EXTREMITIES:  Moves all  extremities well, without clubbing, cyanosis, or edema ?NEUROLOGIC:  Alert and oriented x 3, normal gait, CN II-XII grossly intact ?MENTAL STATUS:  appropriate ?BACK:  Non-tender to palpation, No CVAT ?SKIN:  Warm, dry, and intact ? ? ?Results: ?U/A dipstick negative ?

## 2022-01-30 ENCOUNTER — Encounter (HOSPITAL_COMMUNITY)
Admission: RE | Admit: 2022-01-30 | Discharge: 2022-01-30 | Disposition: A | Discharge status: Home / Self Care | Payer: Medicare HMO | Source: Ambulatory Visit | Attending: Nephrology | Admitting: Nephrology

## 2022-01-30 ENCOUNTER — Other Ambulatory Visit: Payer: Self-pay

## 2022-01-30 DIAGNOSIS — N1832 Chronic kidney disease, stage 3b: Secondary | ICD-10-CM | POA: Insufficient documentation

## 2022-01-30 DIAGNOSIS — E1165 Type 2 diabetes mellitus with hyperglycemia: Secondary | ICD-10-CM

## 2022-01-30 DIAGNOSIS — D631 Anemia in chronic kidney disease: Secondary | ICD-10-CM | POA: Insufficient documentation

## 2022-01-30 LAB — POCT HEMOGLOBIN-HEMACUE: Hemoglobin: 8.6 g/dL — ABNORMAL LOW (ref 12.0–15.0)

## 2022-01-30 MED ORDER — EPOETIN ALFA-EPBX 2000 UNIT/ML IJ SOLN
2000.0000 [IU] | Freq: Once | INTRAMUSCULAR | Status: AC
  Administered 2022-01-30: 2000 [IU] via SUBCUTANEOUS

## 2022-01-30 MED ORDER — EPOETIN ALFA-EPBX 2000 UNIT/ML IJ SOLN
INTRAMUSCULAR | Status: AC
  Filled 2022-01-30: qty 2

## 2022-01-30 MED ORDER — ACCU-CHEK GUIDE VI STRP: ORAL_STRIP | 1 refills | Status: DC

## 2022-01-30 MED ORDER — EPOETIN ALFA-EPBX 3000 UNIT/ML IJ SOLN: 3000.0000 [IU] | Freq: Once | INTRAMUSCULAR | Status: DC

## 2022-02-08 ENCOUNTER — Encounter (INDEPENDENT_AMBULATORY_CARE_PROVIDER_SITE_OTHER): Payer: Self-pay

## 2022-02-08 ENCOUNTER — Telehealth (INDEPENDENT_AMBULATORY_CARE_PROVIDER_SITE_OTHER): Payer: Self-pay

## 2022-02-08 ENCOUNTER — Other Ambulatory Visit (INDEPENDENT_AMBULATORY_CARE_PROVIDER_SITE_OTHER): Payer: Self-pay

## 2022-02-08 DIAGNOSIS — R195 Other fecal abnormalities: Secondary | ICD-10-CM

## 2022-02-08 DIAGNOSIS — K921 Melena: Secondary | ICD-10-CM

## 2022-02-08 DIAGNOSIS — D509 Iron deficiency anemia, unspecified: Secondary | ICD-10-CM

## 2022-02-08 MED ORDER — PEG 3350-KCL-NA BICARB-NACL 420 G PO SOLR: 4000.0000 mL | ORAL | 0 refills | Status: DC

## 2022-02-08 NOTE — Telephone Encounter (Signed)
Laura Atkins [name], CMA  ?

## 2022-02-09 ENCOUNTER — Ambulatory Visit: Payer: Medicare HMO | Admitting: Cardiology

## 2022-02-09 ENCOUNTER — Encounter: Payer: Self-pay | Admitting: Cardiology

## 2022-02-09 VITALS — BP 120/58 | HR 72 | Wt 253.6 lb

## 2022-02-09 DIAGNOSIS — R600 Localized edema: Secondary | ICD-10-CM | POA: Diagnosis not present

## 2022-02-09 DIAGNOSIS — I10 Essential (primary) hypertension: Secondary | ICD-10-CM | POA: Diagnosis not present

## 2022-02-09 DIAGNOSIS — E782 Mixed hyperlipidemia: Secondary | ICD-10-CM | POA: Diagnosis not present

## 2022-02-09 NOTE — Patient Instructions (Addendum)
Medication Instructions:  Continue all current medications.   Labwork: none  Testing/Procedures: none  Follow-Up: 6 months   Any Other Special Instructions Will Be Listed Below (If Applicable).   If you need a refill on your cardiac medications before your next appointment, please call your pharmacy.  

## 2022-02-09 NOTE — Progress Notes (Signed)
Clinical Summary Laura Atkins is a 81 y.o.female seen today for follow up of the following medical problems.    1. Leg swelling  - chronic leg swelling that overall has been controlled Alternating torsemide 42m and 640m - chronic stable mid LE edema.       2. HTN - compliant with meds   3. HL - compliant with statin.  Last panel Jan 2018 TC 162 TG 90 HDL 68 LDL 76   05/2020 TC 108 TG 74 HDL 49 LDL 44 03/2021 TC 14 TG 111 HDL 58 LDL 66 Jan 2023 TC 153 TG 136 HDL 60 LDL 70 - she is on atorvastatin 4057maily   5. Alpha thalasemia - followed by hematology   6. CKD III - Cr has trended up to 1.7, followed by Laura Atkins/27/23 Cr 1.81      7. Anemia - per pcp Past Medical History:  Diagnosis Date   Alpha thalassemia trait 01/26/2010   02/2012: Nl CBC ex H&H-10.7/34.8, MCV-69    Anemia    Anxiety    Anxiety and depression    Cellulitis 05/09/2017   Depression    Diabetes mellitus    Foot pain, right 04/30/2013   GERD (gastroesophageal reflux disease)    Headache(784.0)    Hyperlipidemia    Hypertension    Iron deficiency 01/21/2017   Microcytic anemia 01/26/2010   02/2012: Nl CBC ex H&H-10.7/34.8, MCV-69    NECK PAIN, CHRONIC 10/21/2008   +chronic back pain     Obesity    Obstructive sleep apnea    Osteoarthritis    Left knee; right shoulder; chronic neck and back pain   Pruritus    PVD (peripheral vascular disease) ([facility]/03/2014   Seizures (HCC)    Shoulder pain, right 04/14/2015   Tremor    This started months ago after her seizure progressing to very poor hand writing   Urinary incontinence    UTI (urinary tract infection) 01/18/2013     Allergies  Allergen Reactions   Penicillins Shortness Of Breath, Itching and Rash   Prednisone Shortness Of Breath, Itching and Rash   Propoxyphene N-Acetaminophen Itching and Nausea And Vomiting   Sulfa Antibiotics Itching and Nausea And Vomiting     Current Outpatient Medications  Medication Sig Dispense Refill    Accu-Chek FastClix Lancets MISC 1 each by Does not apply route 4 (four) times daily. Use to monitor glucose levels 4 times daily; E11.65 306 each 2   Alcohol Swabs (B-D SINGLE USE SWABS REGULAR) PADS 1 each by Does not apply route 4 (four) times daily. Use to prep site for glucose monitoring 4 times daily; E11.65 300 each 2   amLODipine (NORVASC) 5 MG tablet TAKE 1 TABLET BY MOUTH EVERY DAY 30 tablet 3   aspirin 81 MG tablet Take 81 mg by mouth daily.     atorvastatin (LIPITOR) 40 MG tablet TAKE 1 TABLET BY MOUTH EVERY DAY 30 tablet 2   benazepril (LOTENSIN) 20 MG tablet TAKE 1 TABLET BY MOUTH EVERY DAY 30 tablet 5   Blood Glucose Monitoring Suppl (ACCU-CHEK GUIDE ME) w/Device KIT 1 each by Does not apply route 4 (four) times daily. Use to monitor glucose levels 4 times daily; E11.65 1 kit 0   buPROPion (WELLBUTRIN XL) 150 MG 24 hr tablet Take 1 tablet (150 mg total) by mouth every morning. 90 tablet 2   Cholecalciferol 125 MCG (5000 UT) TABS Take 1 tablet by mouth daily.  diphenhydrAMINE (BENADRYL) 25 MG tablet Take 25 mg by mouth at bedtime.     donepezil (ARICEPT) 5 MG tablet TAKE 1 TABLET BY MOUTH AT BEDTIME 30 tablet 2   ferrous sulfate 325 (65 FE) MG EC tablet Take 1 tablet by mouth 2 (two) times daily with a meal.     gabapentin (NEURONTIN) 400 MG capsule TAKE ONE CAPSULE BY MOUTH EVERY MORNING and TAKE ONE CAPSULE AT NOON and TAKE TWO CAPSULES AT BEDTIME 360 capsule 1   glucose blood (ACCU-CHEK GUIDE) test strip USE TO TEST BLOOD SUGAR FOUR TIMES DAILY 200 strip 1   hyoscyamine (LEVBID) 0.375 MG 12 hr tablet Take one tablet by mouth t5wo times daily as needed, for abdominal cramping 30 tablet 0   insulin degludec (TRESIBA FLEXTOUCH) 200 UNIT/ML FlexTouch Pen Inject 12 Units into the skin at bedtime. 6 mL 3   Insulin Pen Needle (PEN NEEDLES) 31G X 5 MM MISC Use to inject insulin once daily 100 each 4   Insulin Syringe-Needle U-100 (INSULIN SYRINGE 1CC/31GX5/16") 31G X 5/16" 1 ML MISC 1  each by Does not apply route daily. Use to inject insulin daily; E11.65 90 each 2   Multiple Vitamin (MULTIVITAMIN) capsule Take 1 capsule by mouth daily.     omeprazole (PRILOSEC) 20 MG capsule TAKE ONE CAPSULE BY MOUTH EVERY DAY 30 capsule 2   polyethylene glycol-electrolytes (TRILYTE) 420 g solution Take 4,000 mLs by mouth as directed. 4000 mL 0   potassium chloride SA (KLOR-CON M) 20 MEQ tablet TAKE 1 TABLET BY MOUTH TWICE DAILY 60 tablet 1   sertraline (ZOLOFT) 100 MG tablet Take 1 tablet (100 mg total) by mouth daily. 90 tablet 2   tiZANidine (ZANAFLEX) 2 MG tablet Take 1 tablet (2 mg total) by mouth every 6 (six) hours as needed for muscle spasms. 20 tablet 0   torsemide (DEMADEX) 20 MG tablet TAKE 40 MG EVERY OTHER DAY ALTERNATING 60 MG EVERY OTHER DAY 225 tablet 0   UNABLE TO FIND Walker x 1  DX unsteady gait, osteoarthritis of left knee 1 each 0   UNABLE TO FIND Elevated Toliet Seat x 1 DX: unsteady gait, back pain, osteoarthritis of left knee 1 each 0   UNABLE TO FIND Standing upright walker x 1  DX M54.40, M19.90 1 each 0   UNABLE TO FIND Incontinence pads and supplies 1 each 1   UNABLE TO FIND Rollator Walker 1 Product 0   UNABLE TO FIND XL Tranquility Briefs for incontinence. 1 Package 3   Vibegron (GEMTESA) 75 MG TABS Take 75 mg by mouth daily. 28 tablet 0   No current facility-administered medications for this visit.     Past Surgical History:  Procedure Laterality Date   ABDOMINAL HYSTERECTOMY     BREAST EXCISIONAL BIOPSY     Left; cyst   CATARACT EXTRACTION Right    12/2017   CATARACT EXTRACTION W/ INTRAOCULAR LENS IMPLANT Left 09/07/2013   CHOLECYSTECTOMY     COLONOSCOPY     COLONOSCOPY N/A 07/20/2015   Procedure: COLONOSCOPY;  Surgeon: Rogene Houston, MD;  Location: [facility];  Service: Endoscopy;  Laterality: N/A;  930   EYE SURGERY Left 09/07/2013   cataract     Allergies  Allergen Reactions   Penicillins Shortness Of Breath, Itching and Rash    Prednisone Shortness Of Breath, Itching and Rash   Propoxyphene N-Acetaminophen Itching and Nausea And Vomiting   Sulfa Antibiotics Itching and Nausea And Vomiting  Family History  Problem Relation Age of Onset   Lung cancer Mother 41   Kidney disease Father    Diabetes Sister    Keloids Brother    ADD / ADHD Grandchild    Bipolar disorder Grandchild    Bipolar disorder Daughter    Seizures Daughter    Heart disease Daughter    Kidney disease Son    Neuropathy Son    Kidney disease Son    Edema Daughter    Breast cancer Daughter 40   Allergies Daughter    Alcohol abuse Neg Hx    Drug abuse Neg Hx      Social History Ms. Klinker reports that she has never smoked. She has never used smokeless tobacco. Ms. Cifuentes reports no history of alcohol use.   Review of Systems CONSTITUTIONAL: No weight loss, fever, chills, weakness or fatigue.  HEENT: Eyes: No visual loss, blurred vision, double vision or yellow sclerae.No hearing loss, sneezing, congestion, runny nose or sore throat.  SKIN: No rash or itching.  CARDIOVASCULAR: per hpi RESPIRATORY: No shortness of breath, cough or sputum.  GASTROINTESTINAL: No anorexia, nausea, vomiting or diarrhea. No abdominal pain or blood.  GENITOURINARY: No burning on urination, no polyuria NEUROLOGICAL: No headache, dizziness, syncope, paralysis, ataxia, numbness or tingling in the extremities. No change in bowel or bladder control.  MUSCULOSKELETAL: No muscle, back pain, joint pain or stiffness.  LYMPHATICS: No enlarged nodes. No history of splenectomy.  PSYCHIATRIC: No history of depression or anxiety.  ENDOCRINOLOGIC: No reports of sweating, cold or heat intolerance. No polyuria or polydipsia.  Marland Kitchen   Physical Examination Today's Vitals   02/09/22 1551  BP: (!) 120/58  Pulse: 72  SpO2: 97%  Weight: 253 lb 9.6 oz (115 kg)   Body mass index is 38.56 kg/m.  Gen: resting comfortably, no acute distress HEENT: no scleral icterus,  pupils equal round and reactive, no palptable cervical adenopathy,  CV: RRR, no m/r/g, no jvd Resp: Clear to auscultation bilaterally GI: abdomen is soft, non-tender, non-distended, normal bowel sounds, no hepatosplenomegaly MSK: extremities are warm, trace bilateral edema.  Skin: warm, no rash Neuro:  no focal deficits Psych: appropriate affect   Diagnostic Studies  01/2013 echo Study Conclusions  - Left ventricle: The cavity size was normal. There was mild   concentric hypertrophy. Systolic function was normal. The   estimated ejection fraction was in the range of 60% to   65%. Wall motion was normal; there were no regional wall   motion abnormalities. - Aortic valve: Mildly calcified annulus. - Right ventricle: The cavity size was normal. Wall   thickness was mildly increased. - Atrial septum: No defect or patent foramen ovale was   identified.   01/2013 MPI IMPRESSION: Probably negative pharmacologic stress nuclear myocardial study revealing normal left ventricular size, normal left ventricular systolic function and no significant stress-induced EKG abnormalities.  On scintigraphic imaging, there was a small and mild defect that likely represents variable breast attenuation; however, the possibility of modest anterior ischemia cannot be unequivocally excluded.   01/2013 Event monitor No significant arrhythmias   12/2014 ABIs: R 1.10 L 1.15   02/2015 Carotid US 1-39% bilateral stenosis   Assessment and Plan   1. Chronic LE edema -overall controlled, continue current diuretic.      2. HTN - at goal, continue current meds   3. Hyperlipidemia - she is at goal, continue current meds        Arnoldo Lenis, M.D.

## 2022-02-13 ENCOUNTER — Encounter (HOSPITAL_COMMUNITY)
Admission: RE | Admit: 2022-02-13 | Discharge: 2022-02-13 | Disposition: A | Discharge status: Home / Self Care | Payer: Medicare HMO | Source: Ambulatory Visit | Attending: Nephrology | Admitting: Nephrology

## 2022-02-13 VITALS — BP 126/50 | HR 63 | Temp 98.1°F | Resp 18 | Ht 68.0 in | Wt 253.6 lb

## 2022-02-13 DIAGNOSIS — N1832 Chronic kidney disease, stage 3b: Secondary | ICD-10-CM | POA: Diagnosis not present

## 2022-02-13 DIAGNOSIS — D631 Anemia in chronic kidney disease: Secondary | ICD-10-CM | POA: Diagnosis not present

## 2022-02-13 DIAGNOSIS — N184 Chronic kidney disease, stage 4 (severe): Secondary | ICD-10-CM

## 2022-02-13 LAB — POCT HEMOGLOBIN-HEMACUE: Hemoglobin: 8.9 g/dL — ABNORMAL LOW (ref 12.0–15.0)

## 2022-02-13 MED ORDER — EPOETIN ALFA-EPBX 10000 UNIT/ML IJ SOLN: 4000.0000 [IU] | Freq: Once | INTRAMUSCULAR | Status: AC

## 2022-02-13 MED ORDER — EPOETIN ALFA-EPBX 2000 UNIT/ML IJ SOLN
INTRAMUSCULAR | Status: AC
  Administered 2022-02-13: 4000 [IU]
  Filled 2022-02-13: qty 2

## 2022-02-20 ENCOUNTER — Encounter: Payer: Self-pay | Admitting: Family Medicine

## 2022-02-20 ENCOUNTER — Ambulatory Visit (INDEPENDENT_AMBULATORY_CARE_PROVIDER_SITE_OTHER): Payer: Medicare HMO | Admitting: Family Medicine

## 2022-02-20 VITALS — BP 133/69 | HR 76 | Ht 68.0 in | Wt 249.0 lb

## 2022-02-20 DIAGNOSIS — I10 Essential (primary) hypertension: Secondary | ICD-10-CM

## 2022-02-20 DIAGNOSIS — Z794 Long term (current) use of insulin: Secondary | ICD-10-CM | POA: Diagnosis not present

## 2022-02-20 DIAGNOSIS — I739 Peripheral vascular disease, unspecified: Secondary | ICD-10-CM | POA: Diagnosis not present

## 2022-02-20 DIAGNOSIS — E1165 Type 2 diabetes mellitus with hyperglycemia: Secondary | ICD-10-CM | POA: Diagnosis not present

## 2022-02-20 DIAGNOSIS — R269 Unspecified abnormalities of gait and mobility: Secondary | ICD-10-CM | POA: Diagnosis not present

## 2022-02-20 NOTE — Patient Instructions (Addendum)
F/U in 4 months, flu vaccine at visit   Please get fasting lipid, cmp and EGFR  5 days before September appointment   You are referred to Vascular Specialist to assess circulation in feet and legs  Please get both shingrix vaccines at your pharmacy in the next 4 months, you NEED them  Careful not to fall.  Continue to control blood sugar by good food choice  Thanks for choosing [facility], we consider it a privelige to serve you.

## 2022-02-20 NOTE — Assessment & Plan Note (Addendum)
PAD affecting lower ext with skin changes and ulcer under left great toe , refer for assessment

## 2022-02-20 NOTE — Assessment & Plan Note (Signed)
Controlled, no change in medication DASH diet and commitment to daily physical activity for a minimum of 30 minutes discussed and encouraged, as a part of hypertension management. The importance of attaining a healthy weight is also discussed.     02/20/2022   10:23 AM 02/13/2022    3:25 PM 02/09/2022    3:51 PM 01/30/2022    3:00 PM 01/25/2022    1:33 PM 01/23/2022    1:36 PM 01/16/2022    3:43 PM  BP/Weight  Systolic BP 876 811 572 620 355 974 163  Diastolic BP 69 50 58 50 71 65 69  Wt. (Lbs) 249.04 253.6 253.6   252   BMI 37.87 kg/m2 38.56 kg/m2 38.56 kg/m2   38.32 kg/m2

## 2022-02-20 NOTE — Assessment & Plan Note (Signed)
Managed by endo, and has upcoming appt [name] is reminded of the importance of commitment to daily physical activity for 30 minutes or more, as able and the need to limit carbohydrate intake to 30 to 60 grams per meal to help with blood sugar control.   The need to take medication as prescribed, test blood sugar as directed, and to call between visits if there is a concern that blood sugar is uncontrolled is also discussed.   [name] is reminded of the importance of daily foot exam, annual eye examination, and good blood sugar, blood pressure and cholesterol control.     Latest Ref Rng & Units 10/24/2021    1:13 PM 10/24/2021   12:18 PM 04/07/2021   10:30 AM 11/17/2020   10:20 AM 06/30/2020   11:43 AM  Diabetic Labs  HbA1c 0.0 - 7.0 % 7.8    10.0   9.5   8.3    Chol 100 - 199 mg/dL  153   144      HDL >39 mg/dL  60   58      Calc LDL 0 - 99 mg/dL  70   66      Triglycerides 0 - 149 mg/dL  136   111      Creatinine 0.57 - 1.00 mg/dL  1.65   1.55          02/20/2022   10:23 AM 02/13/2022    3:25 PM 02/09/2022    3:51 PM 01/30/2022    3:00 PM 01/25/2022    1:33 PM 01/23/2022    1:36 PM 01/16/2022    3:43 PM  BP/Weight  Systolic BP 494 496 759 163 846 659 935  Diastolic BP 69 50 58 50 71 65 69  Wt. (Lbs) 249.04 253.6 253.6   252   BMI 37.87 kg/m2 38.56 kg/m2 38.56 kg/m2   38.32 kg/m2       Latest Ref Rng & Units 12/01/2021    9:40 AM 02/15/2021   12:00 AM  Foot/eye exam completion dates  Eye Exam No Retinopathy  Retinopathy       Foot Form Completion  Done      This result is from an external source.

## 2022-02-20 NOTE — Assessment & Plan Note (Signed)
  Patient re-educated about  the importance of commitment to a  minimum of 150 minutes of exercise per week as able.  The importance of healthy food choices with portion control discussed, as well as eating regularly and within a 12 hour window most days. The need to choose "clean , green" food 50 to 75% of the time is discussed, as well as to make water the primary drink and set a goal of 64 ounces water daily.       02/20/2022   10:23 AM 02/13/2022    3:25 PM 02/09/2022    3:51 PM  Weight /BMI  Weight 249 lb 0.6 oz 253 lb 9.6 oz 253 lb 9.6 oz  Height 5\' 8"  (1.727 m) 5\' 8"  (1.727 m)   BMI 37.87 kg/m2 38.56 kg/m2 38.56 kg/m2

## 2022-02-20 NOTE — Progress Notes (Signed)
Laura Atkins     MRN: [id number]      DOB: 1941-08-20   HPI Laura Atkins is here for follow up and re-evaluation of chronic medical conditions, medication management and review of any available recent lab and radiology data.  Preventive health is updated, specifically  Cancer screening and Immunization.   Has upcoming colonoscopy and is being followed regularly by gI for her chronic c/o abdominal pain Denies polyuria, polydipsia, blurred vision , or hypoglycemic episodes. [place] daughter who cares for her states am sugars are seldom over 130, and her diet is better controlled C/o ulcer / dark spot under left great toe The PT denies any adverse reactions to current medications since the last visit.  No falls No malodorous urine or pain with urination ROS Denies recent fever or chills. Denies sinus pressure, nasal congestion, ear pain or sore throat. Denies chest congestion, productive cough or wheezing. Denies chest pains, palpitations and leg swelling Denies abdominal pain, nausea, vomiting,diarrhea or constipation.   Denies dysuria, frequency, hesitancy or incontinence. C/o chronic  joint pain, swelling and limitation in mobility. Denies headaches, seizures, numbness, or tingling. Denies uncontrolled  depression, anxiety or insomnia. Marland Kitchen   PE  BP 133/69   Pulse 76   Ht 5\' 8"  (1.727 m)   Wt 249 lb 0.6 oz (113 kg)   SpO2 96%   BMI 37.87 kg/m   Patient alert and oriented and in no cardiopulmonary distress.  HEENT: No facial asymmetry, EOMI,     Neck decreased ROM  Chest: Clear to auscultation bilaterally.  CVS: S1, S2 no murmurs, no S3.Regular rate.  .   Ext: No edema  MS: decreased  ROM spine, shoulders, hips and knees.  Skin: Intact, hyperpigmentation of lower extremities, hyperpigmented , healed ulcer  on tip of left great toe  Psych: Good eye contact, normal affect.  not anxious or depressed appearing.  CNS: CN 2-12 intact, power,  normal  throughout.   Assessment & Plan  PAD (peripheral artery disease) (HCC) PAD affecting lower ext with skin changes and ulcer under left great toe , refer for assessment  Essential hypertension Controlled, no change in medication DASH diet and commitment to daily physical activity for a minimum of 30 minutes discussed and encouraged, as a part of hypertension management. The importance of attaining a healthy weight is also discussed.     02/20/2022   10:23 AM 02/13/2022    3:25 PM 02/09/2022    3:51 PM 01/30/2022    3:00 PM 01/25/2022    1:33 PM 01/23/2022    1:36 PM 01/16/2022    3:43 PM  BP/Weight  Systolic BP 536 468 032 122 482 500 370  Diastolic BP 69 50 58 50 71 65 69  Wt. (Lbs) 249.04 253.6 253.6   252   BMI 37.87 kg/m2 38.56 kg/m2 38.56 kg/m2   38.32 kg/m2        Morbid obesity (HCC)  Patient re-educated about  the importance of commitment to a  minimum of 150 minutes of exercise per week as able.  The importance of healthy food choices with portion control discussed, as well as eating regularly and within a 12 hour window most days. The need to choose "clean , green" food 50 to 75% of the time is discussed, as well as to make water the primary drink and set a goal of 64 ounces water daily.       02/20/2022   10:23 AM 02/13/2022    3:25 PM  02/09/2022    3:51 PM  Weight /BMI  Weight 249 lb 0.6 oz 253 lb 9.6 oz 253 lb 9.6 oz  Height 5\' 8"  (1.727 m) 5\' 8"  (1.727 m)   BMI 37.87 kg/m2 38.56 kg/m2 38.56 kg/m2      Type 2 diabetes mellitus with hyperglycemia ([place]) Managed by endo, and has upcoming appt Laura Atkins is reminded of the importance of commitment to daily physical activity for 30 minutes or more, as able and the need to limit carbohydrate intake to 30 to 60 grams per meal to help with blood sugar control.   The need to take medication as prescribed, test blood sugar as directed, and to call between visits if there is a concern that blood sugar is uncontrolled is  also discussed.   Laura Atkins is reminded of the importance of daily foot exam, annual eye examination, and good blood sugar, blood pressure and cholesterol control.     Latest Ref Rng & Units 10/24/2021    1:13 PM 10/24/2021   12:18 PM 04/07/2021   10:30 AM 11/17/2020   10:20 AM 06/30/2020   11:43 AM  Diabetic Labs  HbA1c 0.0 - 7.0 % 7.8    10.0   9.5   8.3    Chol 100 - 199 mg/dL  153   144      HDL >39 mg/dL  60   58      Calc LDL 0 - 99 mg/dL  70   66      Triglycerides 0 - 149 mg/dL  136   111      Creatinine 0.57 - 1.00 mg/dL  1.65   1.55          02/20/2022   10:23 AM 02/13/2022    3:25 PM 02/09/2022    3:51 PM 01/30/2022    3:00 PM 01/25/2022    1:33 PM 01/23/2022    1:36 PM 01/16/2022    3:43 PM  BP/Weight  Systolic BP 280 034 917 915 056 979 480  Diastolic BP 69 50 58 50 71 65 69  Wt. (Lbs) 249.04 253.6 253.6   252   BMI 37.87 kg/m2 38.56 kg/m2 38.56 kg/m2   38.32 kg/m2       Latest Ref Rng & Units 12/01/2021    9:40 AM 02/15/2021   12:00 AM  Foot/eye exam completion dates  Eye Exam No Retinopathy  Retinopathy       Foot Form Completion  Done      This result is from an external source.        UNSTEADY GAIT Home safety reviewed, no falls reported

## 2022-02-20 NOTE — Assessment & Plan Note (Signed)
Home safety reviewed, no falls reported 

## 2022-02-25 ENCOUNTER — Other Ambulatory Visit: Payer: Self-pay | Admitting: Family Medicine

## 2022-02-27 ENCOUNTER — Encounter (HOSPITAL_COMMUNITY): Payer: Self-pay

## 2022-02-27 ENCOUNTER — Encounter (HOSPITAL_COMMUNITY)
Admission: RE | Admit: 2022-02-27 | Discharge: 2022-02-27 | Disposition: A | Discharge status: Home / Self Care | Payer: Medicare HMO | Source: Ambulatory Visit | Attending: Nephrology | Admitting: Nephrology

## 2022-02-27 VITALS — BP 106/72 | HR 63 | Temp 98.0°F | Resp 18

## 2022-02-27 DIAGNOSIS — N184 Chronic kidney disease, stage 4 (severe): Secondary | ICD-10-CM | POA: Insufficient documentation

## 2022-02-27 LAB — POCT HEMOGLOBIN-HEMACUE: Hemoglobin: 9 g/dL — ABNORMAL LOW (ref 12.0–15.0)

## 2022-02-27 MED ORDER — EPOETIN ALFA-EPBX 10000 UNIT/ML IJ SOLN: 4000.0000 [IU] | Freq: Once | INTRAMUSCULAR | Status: DC

## 2022-02-27 MED ORDER — EPOETIN ALFA-EPBX 2000 UNIT/ML IJ SOLN
4000.0000 [IU] | Freq: Once | INTRAMUSCULAR | Status: AC
  Administered 2022-02-27: 4000 [IU] via SUBCUTANEOUS

## 2022-02-27 MED ORDER — EPOETIN ALFA-EPBX 2000 UNIT/ML IJ SOLN
INTRAMUSCULAR | Status: AC
  Filled 2022-02-27: qty 2

## 2022-03-13 ENCOUNTER — Encounter (HOSPITAL_COMMUNITY): Payer: Self-pay

## 2022-03-13 ENCOUNTER — Encounter (HOSPITAL_COMMUNITY)
Admission: RE | Admit: 2022-03-13 | Discharge: 2022-03-13 | Disposition: A | Discharge status: Home / Self Care | Payer: Medicare HMO | Source: Ambulatory Visit | Attending: Nephrology | Admitting: Nephrology

## 2022-03-13 VITALS — BP 126/59 | HR 77 | Temp 97.7°F | Resp 17

## 2022-03-13 DIAGNOSIS — N184 Chronic kidney disease, stage 4 (severe): Secondary | ICD-10-CM

## 2022-03-13 LAB — POCT HEMOGLOBIN-HEMACUE: Hemoglobin: 9 g/dL — ABNORMAL LOW (ref 12.0–15.0)

## 2022-03-13 MED ORDER — EPOETIN ALFA-EPBX 10000 UNIT/ML IJ SOLN
4000.0000 [IU] | Freq: Once | INTRAMUSCULAR | Status: AC
  Administered 2022-03-13: 4000 [IU] via SUBCUTANEOUS

## 2022-03-13 MED ORDER — EPOETIN ALFA-EPBX 10000 UNIT/ML IJ SOLN
INTRAMUSCULAR | Status: AC
  Filled 2022-03-13: qty 1

## 2022-03-16 ENCOUNTER — Encounter (HOSPITAL_COMMUNITY): Payer: Self-pay

## 2022-03-16 ENCOUNTER — Encounter (HOSPITAL_COMMUNITY)
Admission: RE | Admit: 2022-03-16 | Discharge: 2022-03-16 | Disposition: A | Discharge status: Home / Self Care | Payer: Medicare HMO | Source: Ambulatory Visit | Attending: Gastroenterology | Admitting: Gastroenterology

## 2022-03-19 ENCOUNTER — Telehealth (INDEPENDENT_AMBULATORY_CARE_PROVIDER_SITE_OTHER): Payer: Self-pay | Admitting: Gastroenterology

## 2022-03-19 NOTE — Progress Notes (Signed)
This encounter was created in error - please disregard.

## 2022-03-19 NOTE — Telephone Encounter (Signed)
I agree with this, really doubt that being hospitalized will make a difference. She may be even less cooperative around unknown people like nursing staff at the hospital.

## 2022-03-20 ENCOUNTER — Ambulatory Visit (HOSPITAL_COMMUNITY): Admission: RE | Admit: 2022-03-20 | Payer: Medicare HMO | Source: Home / Self Care | Admitting: Gastroenterology

## 2022-03-20 ENCOUNTER — Encounter (HOSPITAL_COMMUNITY): Admission: RE | Payer: Self-pay | Source: Home / Self Care

## 2022-03-20 DIAGNOSIS — D509 Iron deficiency anemia, unspecified: Secondary | ICD-10-CM

## 2022-03-20 DIAGNOSIS — N184 Chronic kidney disease, stage 4 (severe): Secondary | ICD-10-CM

## 2022-03-20 DIAGNOSIS — K7469 Other cirrhosis of liver: Secondary | ICD-10-CM

## 2022-03-20 DIAGNOSIS — E1165 Type 2 diabetes mellitus with hyperglycemia: Secondary | ICD-10-CM

## 2022-03-20 DIAGNOSIS — E611 Iron deficiency: Secondary | ICD-10-CM

## 2022-03-20 SURGERY — COLONOSCOPY WITH PROPOFOL
Anesthesia: Monitor Anesthesia Care

## 2022-03-22 ENCOUNTER — Ambulatory Visit (INDEPENDENT_AMBULATORY_CARE_PROVIDER_SITE_OTHER): Payer: Medicare HMO | Admitting: Gastroenterology

## 2022-03-22 ENCOUNTER — Encounter (INDEPENDENT_AMBULATORY_CARE_PROVIDER_SITE_OTHER): Payer: Self-pay | Admitting: Gastroenterology

## 2022-03-22 VITALS — BP 122/72 | HR 77 | Temp 97.7°F | Ht 68.0 in | Wt 254.3 lb

## 2022-03-22 DIAGNOSIS — R932 Abnormal findings on diagnostic imaging of liver and biliary tract: Secondary | ICD-10-CM | POA: Diagnosis not present

## 2022-03-22 DIAGNOSIS — R1030 Lower abdominal pain, unspecified: Secondary | ICD-10-CM

## 2022-03-22 DIAGNOSIS — D509 Iron deficiency anemia, unspecified: Secondary | ICD-10-CM

## 2022-03-22 MED ORDER — DICYCLOMINE HCL 10 MG PO CAPS: 10.0000 mg | ORAL_CAPSULE | Freq: Two times a day (BID) | ORAL | 1 refills | Status: DC | PRN

## 2022-03-22 NOTE — Progress Notes (Addendum)
Maylon Peppers, M.D. Gastroenterology & Hepatology [facility] [address] [place], [place] [address]  Primary Care Physician: Fayrene Helper, MD [address] [place] [address]  I will communicate my assessment and recommendations to the referring MD via EMR.  Problems: Lower abdominal pain Iron deficiency anemia  History of Present Illness: Laura Atkins is a 81 y.o. female with past medical history of dementia, alpha thalassemia trait, anemia, DM, GERD, HLD, HTN, iron deficiency, OSA, PVD, who presents for follow up of abdominal pain.  The patient was last seen on 01/23/2022. At that time, the patient was scheduled to undergo an EGD and a colonoscopy.  The family member called a few days prior to her EGD and colonoscopy and were concerned about the possibility of having the patient completed bowel prep and adhering to clear liquid diet given her dementia and intermittent episodes of agitation.  Patient and family report that she has presented some intermittent episodes of abdominal pain, mostly located in er lower abdomen around the suprapubic area and she feels it takes her breath away.  States that the symptoms have been present for the last 4 to 6 months.  They are self-limited and there are no triggers for the symptoms.The patient denies having any nausea, vomiting, fever, chills, hematochezia, melena, hematemesis, abdominal distention, diarrhea, jaundice, pruritus or weight loss.  Notably, she had a CT of the abdomen and pelvis without IV contrast on 01/03/2022 that showed changes suggestive of nodularity of the liver but no other acute abnormalities.  She is still on Epogen and has received IV iron.  The patient's most recent hemoglobin was 9.0 on 03/13/2022.  Last Colonoscopy:07/20/15  Prep suboptimal. She had a lot of thick liquid stool scattered throughout the colon. Cecal landmarks well seen. 25 mm flat  ulcerated lesion noted next to ileocecal valve. Multiple biopsies taken. Path only showed ulcerated mucosa. Mucosa of rest of the colon was normal. Normal rectal mucosa. Small hemorrhoids below the dentate line.  Since 2016, she has had at least 2 CT scan with IV contrast(last 2018) that have been negative for malignancy in the colon.   Last Endoscopy:never  Past Medical History: Past Medical History:  Diagnosis Date   Alpha thalassemia trait 01/26/2010   02/2012: Nl CBC ex H&H-10.7/34.8, MCV-69    Anemia    Anxiety    Anxiety and depression    Cellulitis 05/09/2017   Depression    Diabetes mellitus    Foot pain, right 04/30/2013   GERD (gastroesophageal reflux disease)    Headache(784.0)    Hyperlipidemia    Hypertension    Iron deficiency 01/21/2017   Microcytic anemia 01/26/2010   02/2012: Nl CBC ex H&H-10.7/34.8, MCV-69    NECK PAIN, CHRONIC 10/21/2008   +chronic back pain     Obesity    Obstructive sleep apnea    Osteoarthritis    Left knee; right shoulder; chronic neck and back pain   Pruritus    PVD (peripheral vascular disease) ([place]) 01/28/2014   Seizures (HCC)    Shoulder pain, right 04/14/2015   Tremor    This started months ago after her seizure progressing to very poor hand writing   Urinary incontinence    UTI (urinary tract infection) 01/18/2013    Past Surgical History: Past Surgical History:  Procedure Laterality Date   ABDOMINAL HYSTERECTOMY     BREAST EXCISIONAL BIOPSY     Left; cyst   CATARACT EXTRACTION Right  12/2017   CATARACT EXTRACTION W/ INTRAOCULAR LENS IMPLANT Left 09/07/2013   CHOLECYSTECTOMY     COLONOSCOPY     COLONOSCOPY N/A 07/20/2015   Procedure: COLONOSCOPY;  Surgeon: Rogene Houston, MD;  [facility];  Service: Endoscopy;  Laterality: N/A;  930   EYE SURGERY Left 09/07/2013   cataract    Family History: Family History  Problem Relation Age of Onset   Lung cancer Mother 46   Kidney disease Father     Diabetes Sister    Keloids Brother    ADD / ADHD Grandchild    Bipolar disorder Grandchild    Bipolar disorder Daughter    Seizures Daughter    Heart disease Daughter    Kidney disease Son    Neuropathy Son    Kidney disease Son    Edema Daughter    Breast cancer Daughter 64   Allergies Daughter    Alcohol abuse Neg Hx    Drug abuse Neg Hx     Social History: Social History   Tobacco Use  Smoking Status Never  Smokeless Tobacco Never   Social History   Substance and Sexual Activity  Alcohol Use No   Alcohol/week: 0.0 standard drinks of alcohol   Social History   Substance and Sexual Activity  Drug Use No    Allergies: Allergies  Allergen Reactions   Penicillins Shortness Of Breath, Itching and Rash   Prednisone Shortness Of Breath, Itching and Rash   Propoxyphene N-Acetaminophen Itching and Nausea And Vomiting   Sulfa Antibiotics Itching and Nausea And Vomiting    Medications: Current Outpatient Medications  Medication Sig Dispense Refill   Accu-Chek FastClix Lancets MISC 1 each by Does not apply route 4 (four) times daily. Use to monitor glucose levels 4 times daily; E11.65 306 each 2   Alcohol Swabs (B-D SINGLE USE SWABS REGULAR) PADS 1 each by Does not apply route 4 (four) times daily. Use to prep site for glucose monitoring 4 times daily; E11.65 300 each 2   amLODipine (NORVASC) 5 MG tablet TAKE 1 TABLET BY MOUTH EVERY DAY 30 tablet 3   aspirin 81 MG tablet Take 81 mg by mouth daily.     atorvastatin (LIPITOR) 40 MG tablet TAKE 1 TABLET BY MOUTH EVERY DAY 30 tablet 2   benazepril (LOTENSIN) 20 MG tablet TAKE 1 TABLET BY MOUTH EVERY DAY 30 tablet 5   Blood Glucose Monitoring Suppl (ACCU-CHEK GUIDE ME) w/Device KIT 1 each by Does not apply route 4 (four) times daily. Use to monitor glucose levels 4 times daily; E11.65 1 kit 0   buPROPion (WELLBUTRIN XL) 150 MG 24 hr tablet Take 1 tablet (150 mg total) by mouth every morning. 90 tablet 2   Cholecalciferol 125  MCG (5000 UT) TABS Take 5,000 Units by mouth daily.     diphenhydrAMINE (BENADRYL) 25 MG tablet Take 25 mg by mouth at bedtime.     donepezil (ARICEPT) 5 MG tablet TAKE 1 TABLET BY MOUTH AT BEDTIME 30 tablet 2   gabapentin (NEURONTIN) 400 MG capsule TAKE ONE CAPSULE BY MOUTH EVERY MORNING and TAKE ONE CAPSULE AT NOON and TAKE TWO CAPSULES AT BEDTIME 360 capsule 1   glucose blood (ACCU-CHEK GUIDE) test strip USE TO TEST BLOOD SUGAR FOUR TIMES DAILY 200 strip 1   hyoscyamine (LEVBID) 0.375 MG 12 hr tablet Take one tablet by mouth t5wo times daily as needed, for abdominal cramping 30 tablet 0   insulin degludec (TRESIBA FLEXTOUCH) 200 UNIT/ML FlexTouch  Pen Inject 12 Units into the skin at bedtime. 6 mL 3   Insulin Pen Needle (PEN NEEDLES) 31G X 5 MM MISC Use to inject insulin once daily 100 each 4   Insulin Syringe-Needle U-100 (INSULIN SYRINGE 1CC/31GX5/16") 31G X 5/16" 1 ML MISC 1 each by Does not apply route daily. Use to inject insulin daily; E11.65 90 each 2   Multiple Vitamin (MULTIVITAMIN) capsule Take 1 capsule by mouth daily.     omeprazole (PRILOSEC) 20 MG capsule TAKE ONE CAPSULE BY MOUTH EVERY DAY 30 capsule 2   potassium chloride SA (KLOR-CON M) 20 MEQ tablet TAKE 1 TABLET BY MOUTH TWICE DAILY 60 tablet 1   sertraline (ZOLOFT) 100 MG tablet Take 1 tablet (100 mg total) by mouth daily. 90 tablet 2   tiZANidine (ZANAFLEX) 2 MG tablet Take 1 tablet (2 mg total) by mouth every 6 (six) hours as needed for muscle spasms. 20 tablet 0   torsemide (DEMADEX) 20 MG tablet TAKE 40 MG EVERY OTHER DAY ALTERNATING 60 MG EVERY OTHER DAY 225 tablet 0   UNABLE TO FIND Walker x 1  DX unsteady gait, osteoarthritis of left knee 1 each 0   UNABLE TO FIND Elevated Toliet Seat x 1 DX: unsteady gait, back pain, osteoarthritis of left knee 1 each 0   UNABLE TO FIND Standing upright walker x 1  DX M54.40, M19.90 1 each 0   UNABLE TO FIND Incontinence pads and supplies 1 each 1   UNABLE TO FIND Rollator Walker 1  Product 0   UNABLE TO FIND XL Tranquility Briefs for incontinence. 1 Package 3   Vibegron (GEMTESA) 75 MG TABS Take 75 mg by mouth daily. 28 tablet 0   No current facility-administered medications for this visit.    Review of Systems: GENERAL: negative for malaise, night sweats HEENT: No changes in hearing or vision, no nose bleeds or other nasal problems. NECK: Negative for lumps, goiter, pain and significant neck swelling RESPIRATORY: Negative for cough, wheezing CARDIOVASCULAR: Negative for chest pain, leg swelling, palpitations, orthopnea GI: SEE HPI MUSCULOSKELETAL: Negative for joint pain or swelling, back pain, and muscle pain. SKIN: Negative for lesions, rash PSYCH: Negative for sleep disturbance, mood disorder and recent psychosocial stressors. HEMATOLOGY Negative for prolonged bleeding, bruising easily, and swollen nodes. ENDOCRINE: Negative for cold or heat intolerance, polyuria, polydipsia and goiter. NEURO: negative for tremor, gait imbalance, syncope and seizures. The remainder of the review of systems is noncontributory.   Physical Exam: BP 122/72 (BP Location: Left Arm, Patient Position: Sitting, Cuff Size: Large)   Pulse 77   Temp 97.7 F (36.5 C) (Oral)   Ht 5' 8"  (1.727 m)   Wt 254 lb 4.8 oz (115.3 kg)   BMI 38.67 kg/m  GENERAL: The patient is AO x3, in no acute distress. Obese. HEENT: Head is normocephalic and atraumatic. EOMI are intact. Mouth is well hydrated and without lesions. NECK: Supple. No masses LUNGS: Clear to auscultation. No presence of rhonchi/wheezing/rales. Adequate chest expansion HEART: RRR, normal s1 and s2. ABDOMEN: Soft, nontender, no guarding, no peritoneal signs, and nondistended. BS +. No masses. EXTREMITIES: Without any cyanosis, clubbing, rash, lesions or edema. NEUROLOGIC: AOx3, no focal motor deficit. SKIN: no jaundice, no rashes  Imaging/Labs: as above  I personally reviewed and interpreted the available labs, imaging and  endoscopic files.  Impression and Plan: Laura Atkins is a 81 y.o. female with past medical history of dementia, alpha thalassemia trait, anemia, DM, GERD, HLD, HTN, iron deficiency,  OSA, PVD, who presents for follow up of abdominal pain.  The patient has presented intermittent episodes of suprapubic pain.  It is unclear why she has presented this pain.  Cross-sectional abdominal imaging has not shown any organic etiology for her pain.  She has concomitantly presented iron deficiency anemia.  Due to her fluctuating dementia, she could not proceed with EGD and colonoscopy.  We had a thorough discussion with the family regarding the next steps in her care.  They are agreeable to try again a bowel prep but only for 24 hours as she may be more amenable than a 2-day prep.  I explained to him that this will be reasonable with the caveat that if there is significant amount of feces, adequate visualization may not be possible.  For now, she needs to continue following with her nephrologist regarding her low iron stores.  I will prescribe Bentyl as needed to alleviate her abdominal discomfort.  Finally, she had possible nodularity of her liver based on imaging.  We will obtain MELD labs and fibrosis serology today.  Long-term management of possible cirrhosis will depend on the degree of progression of her dementia.    - Check MELD labs, CBC, Fibrotest, iron panel -Stop Levsin, can take Bentyl as needed -Schedule EGD and colonoscopy  All questions were answered.      Harvel Quale, MD Gastroenterology and Hepatology [facility]

## 2022-03-22 NOTE — Patient Instructions (Addendum)
Schedule EGD and colonoscopy Start Bentyl 1 tablet q12h as needed for abdominal pain Perform blood workup Stop hyoscyamine

## 2022-03-23 ENCOUNTER — Ambulatory Visit: Payer: Medicare HMO | Admitting: Podiatry

## 2022-03-28 ENCOUNTER — Other Ambulatory Visit: Payer: Self-pay | Admitting: Family Medicine

## 2022-03-28 ENCOUNTER — Other Ambulatory Visit: Payer: Self-pay | Admitting: Cardiology

## 2022-03-28 DIAGNOSIS — N189 Chronic kidney disease, unspecified: Secondary | ICD-10-CM | POA: Diagnosis not present

## 2022-03-28 DIAGNOSIS — D638 Anemia in other chronic diseases classified elsewhere: Secondary | ICD-10-CM | POA: Diagnosis not present

## 2022-03-28 DIAGNOSIS — R809 Proteinuria, unspecified: Secondary | ICD-10-CM | POA: Diagnosis not present

## 2022-03-28 DIAGNOSIS — E1122 Type 2 diabetes mellitus with diabetic chronic kidney disease: Secondary | ICD-10-CM | POA: Diagnosis not present

## 2022-03-28 DIAGNOSIS — I129 Hypertensive chronic kidney disease with stage 1 through stage 4 chronic kidney disease, or unspecified chronic kidney disease: Secondary | ICD-10-CM | POA: Diagnosis not present

## 2022-03-29 LAB — COMPREHENSIVE METABOLIC PANEL
AG Ratio: 1.2 (calc) (ref 1.0–2.5)
ALT: 20 U/L (ref 6–29)
AST: 17 U/L (ref 10–35)
Albumin: 4.1 g/dL (ref 3.6–5.1)
Alkaline phosphatase (APISO): 111 U/L (ref 37–153)
BUN/Creatinine Ratio: 29 (calc) — ABNORMAL HIGH (ref 6–22)
BUN: 58 mg/dL — ABNORMAL HIGH (ref 7–25)
CO2: 28 mmol/L (ref 20–32)
Calcium: 9.4 mg/dL (ref 8.6–10.4)
Chloride: 104 mmol/L (ref 98–110)
Creat: 2.02 mg/dL — ABNORMAL HIGH (ref 0.60–1.00)
Globulin: 3.3 g/dL (calc) (ref 1.9–3.7)
Glucose, Bld: 157 mg/dL — ABNORMAL HIGH (ref 65–99)
Potassium: 5.1 mmol/L (ref 3.5–5.3)
Sodium: 141 mmol/L (ref 135–146)
Total Bilirubin: 0.4 mg/dL (ref 0.2–1.2)
Total Protein: 7.4 g/dL (ref 6.1–8.1)

## 2022-03-29 LAB — CBC WITH DIFFERENTIAL/PLATELET
Absolute Monocytes: 391 cells/uL (ref 200–950)
Basophils Absolute: 19 cells/uL (ref 0–200)
Basophils Relative: 0.3 %
Eosinophils Absolute: 434 cells/uL (ref 15–500)
Eosinophils Relative: 7 %
HCT: 31.4 % — ABNORMAL LOW (ref 35.0–45.0)
Hemoglobin: 9.4 g/dL — ABNORMAL LOW (ref 11.7–15.5)
Lymphs Abs: 2176 cells/uL (ref 850–3900)
MCH: 21.1 pg — ABNORMAL LOW (ref 27.0–33.0)
MCHC: 29.9 g/dL — ABNORMAL LOW (ref 32.0–36.0)
MCV: 70.4 fL — ABNORMAL LOW (ref 80.0–100.0)
MPV: 11.4 fL (ref 7.5–12.5)
Monocytes Relative: 6.3 %
Neutro Abs: 3181 cells/uL (ref 1500–7800)
Neutrophils Relative %: 51.3 %
Platelets: 238 10*3/uL (ref 140–400)
RBC: 4.46 10*6/uL (ref 3.80–5.10)
RDW: 15.4 % — ABNORMAL HIGH (ref 11.0–15.0)
Total Lymphocyte: 35.1 %
WBC: 6.2 10*3/uL (ref 3.8–10.8)

## 2022-03-29 LAB — PROTIME-INR
INR: 1
Prothrombin Time: 10.9 s (ref 9.0–11.5)

## 2022-03-29 LAB — LIVER FIBROSIS, FIBROTEST-ACTITEST
ALT: 16 U/L (ref 6–29)
Alpha-2-Macroglobulin: 123 mg/dL (ref 106–279)
Apolipoprotein A1: 168 mg/dL (ref 101–198)
Bilirubin: 0.3 mg/dL (ref 0.2–1.2)
Fibrosis Score: 0.07
GGT: 26 U/L (ref 3–65)
Haptoglobin: 179 mg/dL (ref 43–212)
Necroinflammat ACT Score: 0.04
Reference ID: 4441740

## 2022-03-29 LAB — IRON,TIBC AND FERRITIN PANEL
%SAT: 23 % (calc) (ref 16–45)
Ferritin: 104 ng/mL (ref 16–288)
Iron: 64 ug/dL (ref 45–160)
TIBC: 284 mcg/dL (calc) (ref 250–450)

## 2022-04-02 ENCOUNTER — Ambulatory Visit (INDEPENDENT_AMBULATORY_CARE_PROVIDER_SITE_OTHER): Payer: Medicare HMO | Admitting: Urology

## 2022-04-02 ENCOUNTER — Ambulatory Visit: Payer: Medicare HMO | Admitting: Urology

## 2022-04-02 ENCOUNTER — Encounter: Payer: Self-pay | Admitting: Urology

## 2022-04-02 VITALS — BP 124/71 | HR 97

## 2022-04-02 DIAGNOSIS — N3941 Urge incontinence: Secondary | ICD-10-CM | POA: Diagnosis not present

## 2022-04-02 DIAGNOSIS — Z87440 Personal history of urinary (tract) infections: Secondary | ICD-10-CM | POA: Diagnosis not present

## 2022-04-02 LAB — URINALYSIS, ROUTINE W REFLEX MICROSCOPIC
Bilirubin, UA: NEGATIVE
Glucose, UA: NEGATIVE
Ketones, UA: NEGATIVE
Leukocytes,UA: NEGATIVE
Nitrite, UA: NEGATIVE
Protein,UA: NEGATIVE
RBC, UA: NEGATIVE
Specific Gravity, UA: 1.01 (ref 1.005–1.030)
Urobilinogen, Ur: 0.2 mg/dL (ref 0.2–1.0)
pH, UA: 5 (ref 5.0–7.5)

## 2022-04-02 NOTE — Progress Notes (Signed)
Assessment: 1. Urge incontinence   2. History of UTI     Plan: She is currently doing well.  Will monitor for the present time. Return to office in 3 months.   Chief Complaint:  Chief Complaint  Patient presents with   Urinary Incontinence    History of Present Illness:  Laura Atkins is a 81 y.o. year old female who is seen for further evaluation of urinary incontinence and UTI's.  She reported symptoms for 10+ years with urinary frequency, urgency, and urge incontinence.  She has used  using maxipads twice daily.  No prior medical therapy for her incontinence symptoms.  She does have a history of UTIs. Urine culture results: 10/21: E. coli 1/22: <10K colonies 2/22: <10K colonies 7/22: E. coli 8/25: E. Coli  She typically has symptoms of increased frequency and dysuria associated with UTIs.  No history of gross hematuria.  She does have chronic kidney disease and is followed by nephrology. Renal ultrasound from 05/19/2021 showed no mass or hydronephrosis involving either kidney and a small amount of layering debris in the bladder.  She was given a trial of Myrbetriq 25 mg daily at her visit on 08/08/2021.  She noted some slight improvement in her symptoms but she continue to have incontinence associated with urgency, both daytime and nighttime.  She was using maxipads 2-3 times per day.  No dysuria or gross hematuria.  Her dose of Myrbetriq was increased to 50 mg daily at her visit on 09/05/2021.  She continued on Myrbetriq 50 mg daily but had not seen a significant improvement in her symptoms.  She continued to have frequency, urgency, and incontinence both daytime and nighttime.  No dysuria or gross hematuria.  She was interested in a purewick device. She continued to use incontinence pads daily. She was given a trial of Vesicare 10 mg daily in 1/23.  At her visit in May 2023, she was no longer taking the Vesicare due to side effects of abdominal pain and difficulty  voiding.  She discontinued the medication after approximately 3 weeks.  She continued to have symptoms of frequency, urgency, and urinary incontinence.  No dysuria or gross hematuria.  She continued to use incontinence pads daily.  She was also having problems with fecal incontinence.  She was undergoing GI evaluation with colonoscopy. She was given a trial of Gemtesa in May 2023.    She returns today for follow-up.  She discontinued the Gemtesa due to concerns about multiple medications and her worsening kidney function.  She is not currently on any medication for her bladder symptoms.  She is actually not having any significant symptoms at the present time.  She is not having problems with urinary incontinence.  No dysuria or gross hematuria.  Portions of the above documentation were copied from a prior visit for review purposes only.  Past Medical History:  Past Medical History:  Diagnosis Date   Alpha thalassemia trait 01/26/2010   02/2012: Nl CBC ex H&H-10.7/34.8, MCV-69    Anemia    Anxiety    Anxiety and depression    Cellulitis 05/09/2017   Depression    Diabetes mellitus    Foot pain, right 04/30/2013   GERD (gastroesophageal reflux disease)    Headache(784.0)    Hyperlipidemia    Hypertension    Iron deficiency 01/21/2017   Microcytic anemia 01/26/2010   02/2012: Nl CBC ex H&H-10.7/34.8, MCV-69    NECK PAIN, CHRONIC 10/21/2008   +chronic back pain  Obesity    Obstructive sleep apnea    Osteoarthritis    Left knee; right shoulder; chronic neck and back pain   Pruritus    PVD (peripheral vascular disease) ([place]) 01/28/2014   Seizures (HCC)    Shoulder pain, right 04/14/2015   Tremor    This started months ago after her seizure progressing to very poor hand writing   Urinary incontinence    UTI (urinary tract infection) 01/18/2013    Past Surgical History:  Past Surgical History:  Procedure Laterality Date   ABDOMINAL HYSTERECTOMY     BREAST EXCISIONAL BIOPSY      Left; cyst   CATARACT EXTRACTION Right    12/2017   CATARACT EXTRACTION W/ INTRAOCULAR LENS IMPLANT Left 09/07/2013   CHOLECYSTECTOMY     COLONOSCOPY     COLONOSCOPY N/A 07/20/2015   Procedure: COLONOSCOPY;  Surgeon: Rogene Houston, MD;  [facility];  Service: Endoscopy;  Laterality: N/A;  930   EYE SURGERY Left 09/07/2013   cataract    Allergies:  Allergies  Allergen Reactions   Penicillins Shortness Of Breath, Itching and Rash   Prednisone Shortness Of Breath, Itching and Rash   Propoxyphene N-Acetaminophen Itching and Nausea And Vomiting   Sulfa Antibiotics Itching and Nausea And Vomiting    Family History:  Family History  Problem Relation Age of Onset   Lung cancer Mother 9   Kidney disease Father    Diabetes Sister    Keloids Brother    ADD / ADHD Grandchild    Bipolar disorder Grandchild    Bipolar disorder Daughter    Seizures Daughter    Heart disease Daughter    Kidney disease Son    Neuropathy Son    Kidney disease Son    Edema Daughter    Breast cancer Daughter 38   Allergies Daughter    Alcohol abuse Neg Hx    Drug abuse Neg Hx     Social History:  Social History   Tobacco Use   Smoking status: Never   Smokeless tobacco: Never  Vaping Use   Vaping Use: Never used  Substance Use Topics   Alcohol use: No    Alcohol/week: 0.0 standard drinks of alcohol   Drug use: No    ROS: Constitutional:  Negative for fever, chills, weight loss CV: Negative for chest pain, previous MI, hypertension Respiratory:  Negative for shortness of breath, wheezing, sleep apnea, frequent cough GI:  Negative for nausea, vomiting, bloody stool, GERD  Physical exam: BP 124/71   Pulse 97  GENERAL APPEARANCE:  Well appearing, well developed, well nourished, NAD HEENT:  Atraumatic, normocephalic, oropharynx clear NECK:  Supple without lymphadenopathy or thyromegaly ABDOMEN:  Soft, non-tender, no masses EXTREMITIES:  Moves all extremities well, without  clubbing, cyanosis, or edema NEUROLOGIC:  Alert and oriented x 3, normal gait, CN II-XII grossly intact MENTAL STATUS:  appropriate BACK:  Non-tender to palpation, No CVAT SKIN:  Warm, dry, and intact   Results: U/A dipstick negative

## 2022-04-02 NOTE — Progress Notes (Deleted)
Assessment: 1. Urge incontinence   2. History of UTI      Plan: I reviewed options for management of her urge incontinence including alternative medical therapy, PTNS, sacral neuromodulation, and chemodenervation with Botox. I again discussed further evaluation with urodynamics. Continue methods to reduce the risk of UTIs including increased fluid intake, timed and double voiding, daily cranberry supplement, and daily probiotics discussed. Trial of Gemtesa 75 mg daily.  Samples provided. Advised her to call with results of medication in 1 month Return of office in 2 months   Chief Complaint:  No chief complaint on file.   History of Present Illness:  Laura Atkins is a 81 y.o. year old female who is seen for further evaluation of urinary incontinence and UTI's.  She reported symptoms for 10+ years with urinary frequency, urgency, and urge incontinence.  She has used  using maxipads twice daily.  No prior medical therapy for her incontinence symptoms.  She does have a history of UTIs. Urine culture results: 10/21: E. coli 1/22: <10K colonies 2/22: <10K colonies 7/22: E. coli 8/25: E. Coli  She typically has symptoms of increased frequency and dysuria associated with UTIs.  No history of gross hematuria.  She does have chronic kidney disease and is followed by nephrology. Renal ultrasound from 05/19/2021 showed no mass or hydronephrosis involving either kidney and a small amount of layering debris in the bladder.  She was given a trial of Myrbetriq 25 mg daily at her visit on 08/08/2021.  She noted some slight improvement in her symptoms but she continue to have incontinence associated with urgency, both daytime and nighttime.  She was using maxipads 2-3 times per day.  No dysuria or gross hematuria.  Her dose of Myrbetriq was increased to 50 mg daily at her visit on 09/05/2021.  She continued on Myrbetriq 50 mg daily but had not seen a significant improvement in her  symptoms.  She continued to have frequency, urgency, and incontinence both daytime and nighttime.  No dysuria or gross hematuria.  She was interested in a purewick device. She continued to use incontinence pads daily. She was given a trial of Vesicare 10 mg daily in 1/23.  At her visit in May 2023, she was no longer taking the Vesicare due to side effects of abdominal pain and difficulty voiding.  She discontinued the medication after approximately 3 weeks.  She continued to have symptoms of frequency, urgency, and urinary incontinence.  No dysuria or gross hematuria.  She continued to use incontinence pads daily.  She was also having problems with fecal incontinence.  She was scheduled to undergo GI evaluation with colonoscopy. She was given a trial of Gemtesa 75 mg daily in May 2023.  Portions of the above documentation were copied from a prior visit for review purposes only.  Past Medical History:  Past Medical History:  Diagnosis Date   Alpha thalassemia trait 01/26/2010   02/2012: Nl CBC ex H&H-10.7/34.8, MCV-69    Anemia    Anxiety    Anxiety and depression    Cellulitis 05/09/2017   Depression    Diabetes mellitus    Foot pain, right 04/30/2013   GERD (gastroesophageal reflux disease)    Headache(784.0)    Hyperlipidemia    Hypertension    Iron deficiency 01/21/2017   Microcytic anemia 01/26/2010   02/2012: Nl CBC ex H&H-10.7/34.8, MCV-69    NECK PAIN, CHRONIC 10/21/2008   +chronic back pain     Obesity    Obstructive  sleep apnea    Osteoarthritis    Left knee; right shoulder; chronic neck and back pain   Pruritus    PVD (peripheral vascular disease) ([place]) 01/28/2014   Seizures (HCC)    Shoulder pain, right 04/14/2015   Tremor    This started months ago after her seizure progressing to very poor hand writing   Urinary incontinence    UTI (urinary tract infection) 01/18/2013    Past Surgical History:  Past Surgical History:  Procedure Laterality Date   ABDOMINAL  HYSTERECTOMY     BREAST EXCISIONAL BIOPSY     Left; cyst   CATARACT EXTRACTION Right    12/2017   CATARACT EXTRACTION W/ INTRAOCULAR LENS IMPLANT Left 09/07/2013   CHOLECYSTECTOMY     COLONOSCOPY     COLONOSCOPY N/A 07/20/2015   Procedure: COLONOSCOPY;  Surgeon: Rogene Houston, MD;  [facility];  Service: Endoscopy;  Laterality: N/A;  930   EYE SURGERY Left 09/07/2013   cataract    Allergies:  Allergies  Allergen Reactions   Penicillins Shortness Of Breath, Itching and Rash   Prednisone Shortness Of Breath, Itching and Rash   Propoxyphene N-Acetaminophen Itching and Nausea And Vomiting   Sulfa Antibiotics Itching and Nausea And Vomiting    Family History:  Family History  Problem Relation Age of Onset   Lung cancer Mother 63   Kidney disease Father    Diabetes Sister    Keloids Brother    ADD / ADHD Grandchild    Bipolar disorder Grandchild    Bipolar disorder Daughter    Seizures Daughter    Heart disease Daughter    Kidney disease Son    Neuropathy Son    Kidney disease Son    Edema Daughter    Breast cancer Daughter 26   Allergies Daughter    Alcohol abuse Neg Hx    Drug abuse Neg Hx     Social History:  Social History   Tobacco Use   Smoking status: Never   Smokeless tobacco: Never  Vaping Use   Vaping Use: Never used  Substance Use Topics   Alcohol use: No    Alcohol/week: 0.0 standard drinks of alcohol   Drug use: No    ROS: Constitutional:  Negative for fever, chills, weight loss CV: Negative for chest pain, previous MI, hypertension Respiratory:  Negative for shortness of breath, wheezing, sleep apnea, frequent cough GI:  Negative for nausea, vomiting, bloody stool, GERD  Physical exam: There were no vitals taken for this visit. GENERAL APPEARANCE:  Well appearing, well developed, well nourished, NAD HEENT:  Atraumatic, normocephalic, oropharynx clear NECK:  Supple without lymphadenopathy or thyromegaly ABDOMEN:  Soft,  non-tender, no masses EXTREMITIES:  Moves all extremities well, without clubbing, cyanosis, or edema NEUROLOGIC:  Alert and oriented x 3, normal gait, CN II-XII grossly intact MENTAL STATUS:  appropriate BACK:  Non-tender to palpation, No CVAT SKIN:  Warm, dry, and intact   Results: U/A dipstick

## 2022-04-03 ENCOUNTER — Encounter (HOSPITAL_COMMUNITY)
Admission: RE | Admit: 2022-04-03 | Discharge: 2022-04-03 | Disposition: A | Discharge status: Home / Self Care | Payer: Medicare PPO | Source: Ambulatory Visit | Attending: Nephrology | Admitting: Nephrology

## 2022-04-03 DIAGNOSIS — D631 Anemia in chronic kidney disease: Secondary | ICD-10-CM | POA: Insufficient documentation

## 2022-04-03 DIAGNOSIS — N184 Chronic kidney disease, stage 4 (severe): Secondary | ICD-10-CM | POA: Insufficient documentation

## 2022-04-03 LAB — POCT HEMOGLOBIN-HEMACUE: Hemoglobin: 9.2 g/dL — ABNORMAL LOW (ref 12.0–15.0)

## 2022-04-03 MED ORDER — EPOETIN ALFA-EPBX 10000 UNIT/ML IJ SOLN: 4000.0000 [IU] | Freq: Once | INTRAMUSCULAR | Status: AC

## 2022-04-03 MED ORDER — EPOETIN ALFA-EPBX 2000 UNIT/ML IJ SOLN
INTRAMUSCULAR | Status: AC
  Administered 2022-04-03: 4000 [IU]
  Filled 2022-04-03: qty 2

## 2022-04-04 DIAGNOSIS — I129 Hypertensive chronic kidney disease with stage 1 through stage 4 chronic kidney disease, or unspecified chronic kidney disease: Secondary | ICD-10-CM | POA: Diagnosis not present

## 2022-04-04 DIAGNOSIS — R809 Proteinuria, unspecified: Secondary | ICD-10-CM | POA: Diagnosis not present

## 2022-04-04 DIAGNOSIS — E1122 Type 2 diabetes mellitus with diabetic chronic kidney disease: Secondary | ICD-10-CM | POA: Diagnosis not present

## 2022-04-04 DIAGNOSIS — D638 Anemia in other chronic diseases classified elsewhere: Secondary | ICD-10-CM | POA: Diagnosis not present

## 2022-04-04 DIAGNOSIS — E875 Hyperkalemia: Secondary | ICD-10-CM | POA: Diagnosis not present

## 2022-04-04 DIAGNOSIS — D508 Other iron deficiency anemias: Secondary | ICD-10-CM | POA: Diagnosis not present

## 2022-04-04 DIAGNOSIS — N189 Chronic kidney disease, unspecified: Secondary | ICD-10-CM | POA: Diagnosis not present

## 2022-04-04 DIAGNOSIS — Z6839 Body mass index (BMI) 39.0-39.9, adult: Secondary | ICD-10-CM | POA: Diagnosis not present

## 2022-04-10 ENCOUNTER — Telehealth: Payer: Self-pay | Admitting: Nurse Practitioner

## 2022-04-10 DIAGNOSIS — E1165 Type 2 diabetes mellitus with hyperglycemia: Secondary | ICD-10-CM

## 2022-04-10 MED ORDER — ACCU-CHEK GUIDE VI STRP: ORAL_STRIP | 1 refills | Status: DC

## 2022-04-10 MED ORDER — ACCU-CHEK FASTCLIX LANCETS MISC: 1.0000 | Freq: Four times a day (QID) | 2 refills | Status: DC

## 2022-04-10 NOTE — Telephone Encounter (Signed)
Pt's daughter left a VM stating she needs a RX sent in for her Accu-Chek Mis FastClix Needles, please send to [facility]

## 2022-04-10 NOTE — Telephone Encounter (Signed)
Rx Sent  

## 2022-04-11 ENCOUNTER — Ambulatory Visit (INDEPENDENT_AMBULATORY_CARE_PROVIDER_SITE_OTHER): Payer: Medicare PPO | Admitting: Podiatry

## 2022-04-11 DIAGNOSIS — R52 Pain, unspecified: Secondary | ICD-10-CM

## 2022-04-11 DIAGNOSIS — L97522 Non-pressure chronic ulcer of other part of left foot with fat layer exposed: Secondary | ICD-10-CM | POA: Diagnosis not present

## 2022-04-11 DIAGNOSIS — E0843 Diabetes mellitus due to underlying condition with diabetic autonomic (poly)neuropathy: Secondary | ICD-10-CM

## 2022-04-11 MED ORDER — GENTAMICIN SULFATE 0.1 % EX CREA: 1.0000 | TOPICAL_CREAM | Freq: Two times a day (BID) | CUTANEOUS | 1 refills | Status: DC

## 2022-04-11 NOTE — Progress Notes (Signed)
No chief complaint on file.   Subjective:  81 y.o. female with PMHx of diabetes mellitus presenting today for new complaint of pain and tenderness to the left great toe.  Patient states a few weeks ago she developed discoloration to the left great toe and she was concerned.  Did not anything for treatment.  She presents with her daughter and her daughter does state that she walks around the house barefoot.  She presents for further treatment and evaluation   Past Medical History:  Diagnosis Date   Alpha thalassemia trait 01/26/2010   02/2012: Nl CBC ex H&H-10.7/34.8, MCV-69    Anemia    Anxiety    Anxiety and depression    Cellulitis 05/09/2017   Depression    Diabetes mellitus    Foot pain, right 04/30/2013   GERD (gastroesophageal reflux disease)    Headache(784.0)    Hyperlipidemia    Hypertension    Iron deficiency 01/21/2017   Microcytic anemia 01/26/2010   02/2012: Nl CBC ex H&H-10.7/34.8, MCV-69    NECK PAIN, CHRONIC 10/21/2008   +chronic back pain     Obesity    Obstructive sleep apnea    Osteoarthritis    Left knee; right shoulder; chronic neck and back pain   Pruritus    PVD (peripheral vascular disease) ([place]) 01/28/2014   Seizures (HCC)    Shoulder pain, right 04/14/2015   Tremor    This started months ago after her seizure progressing to very poor hand writing   Urinary incontinence    UTI (urinary tract infection) 01/18/2013    Past Surgical History:  Procedure Laterality Date   ABDOMINAL HYSTERECTOMY     BREAST EXCISIONAL BIOPSY     Left; cyst   CATARACT EXTRACTION Right    12/2017   CATARACT EXTRACTION W/ INTRAOCULAR LENS IMPLANT Left 09/07/2013   CHOLECYSTECTOMY     COLONOSCOPY     COLONOSCOPY N/A 07/20/2015   Procedure: COLONOSCOPY;  Surgeon: Rogene Houston, MD;  Location: [facility];  Service: Endoscopy;  Laterality: N/A;  930   EYE SURGERY Left 09/07/2013   cataract    Allergies  Allergen Reactions   Penicillins Shortness Of Breath,  Itching and Rash   Prednisone Shortness Of Breath, Itching and Rash   Propoxyphene N-Acetaminophen Itching and Nausea And Vomiting   Sulfa Antibiotics Itching and Nausea And Vomiting      Objective/Physical Exam General: The patient is alert and oriented x3 in no acute distress.  Dermatology:  Wound #1 noted to the distal tip of the left great toe which measures approximately 0.3x0.3 x0.1 Cm (LxWxD).   To the noted ulceration(s), there is no eschar. There is a moderate amount of slough, fibrin, and necrotic tissue noted. Granulation tissue and wound base is red. There is a minimal amount of serosanguineous drainage noted. There is no exposed bone muscle-tendon ligament or joint. There is no malodor. Periwound integrity macerated Skin is warm, dry and supple bilateral lower extremities.  Vascular: Chronic edema noted bilateral lower extremities  VAS [place] ABI W/WO TBI 03/10/2019 Summary:  Right: Resting right ankle-brachial index indicates noncompressible right  lower extremity arteries.The right toe-brachial index is normal. RT great  toe pressure = 122 mmHg.   Triphasic waveforms.  Left: Resting left ankle-brachial index indicates noncompressible left  lower extremity arteries.The left toe-brachial index is normal. LT Great  toe pressure = 131 mmHg.   Triphasic waveforms.  Neurological: Epicritic and protective threshold diminished bilaterally.   Musculoskeletal Exam: No pedal  deformities.  No prior amputations  Assessment: 1.  Ulcer left great toe secondary to diabetes mellitus 2. diabetes mellitus w/ peripheral neuropathy   Plan of Care:  1. Patient was evaluated. 2. medically necessary excisional debridement including subcutaneous tissue was performed using a tissue nipper and a chisel blade. Excisional debridement of all the necrotic nonviable tissue down to healthy bleeding viable tissue was performed with post-debridement measurements same as pre-. 3. the wound was  cleansed and dry sterile dressing applied. 4.  Prescription for gentamicin cream.  Apply daily  5.  Advised against going barefoot.  Patient admits to going barefoot around the house.  Recommend resuming diabetic shoes  6.  Patient is to return to clinic in 3 weeks   Edrick Kins, DPM [facility]  Dr. Edrick Kins, DPM    [address], [place] [address]                Office [phone]  Fax [phone]

## 2022-04-13 ENCOUNTER — Encounter: Payer: Self-pay | Admitting: Family Medicine

## 2022-04-13 ENCOUNTER — Ambulatory Visit (INDEPENDENT_AMBULATORY_CARE_PROVIDER_SITE_OTHER): Payer: Medicare PPO | Admitting: Family Medicine

## 2022-04-13 VITALS — BP 130/62 | HR 59 | Ht 68.0 in | Wt 258.1 lb

## 2022-04-13 DIAGNOSIS — E1165 Type 2 diabetes mellitus with hyperglycemia: Secondary | ICD-10-CM | POA: Diagnosis not present

## 2022-04-13 DIAGNOSIS — E1169 Type 2 diabetes mellitus with other specified complication: Secondary | ICD-10-CM | POA: Diagnosis not present

## 2022-04-13 DIAGNOSIS — F419 Anxiety disorder, unspecified: Secondary | ICD-10-CM

## 2022-04-13 DIAGNOSIS — E785 Hyperlipidemia, unspecified: Secondary | ICD-10-CM

## 2022-04-13 DIAGNOSIS — I10 Essential (primary) hypertension: Secondary | ICD-10-CM

## 2022-04-13 DIAGNOSIS — R1030 Lower abdominal pain, unspecified: Secondary | ICD-10-CM

## 2022-04-13 DIAGNOSIS — Z7409 Other reduced mobility: Secondary | ICD-10-CM | POA: Diagnosis not present

## 2022-04-13 DIAGNOSIS — Z794 Long term (current) use of insulin: Secondary | ICD-10-CM | POA: Diagnosis not present

## 2022-04-13 DIAGNOSIS — F32A Depression, unspecified: Secondary | ICD-10-CM

## 2022-04-13 NOTE — Patient Instructions (Addendum)
F/u in 4 months, fly vaccine at visit, call if you need me sooner  Start taking dicyclomine one daily for stomach cramps, you may take up to twice daily if needed  Please get shingrix vaccines at your pharmacy, you need this  Keep appointment for epogen to improve HB  You are referred to [facility]" in [place] for eye exam , past due  Reconsider upper and lower endoscopy to see why you are anemic  Careful not to fall  Thanks for choosing [facility], we consider it a privelige to serve you.

## 2022-04-13 NOTE — Assessment & Plan Note (Signed)
Laura Atkins is reminded of the importance of commitment to daily physical activity for 30 minutes or more, as able and the need to limit carbohydrate intake to 30 to 60 grams per meal to help with blood sugar control.   The need to take medication as prescribed, test blood sugar as directed, and to call between visits if there is a concern that blood sugar is uncontrolled is also discussed.   Laura Atkins is reminded of the importance of daily foot exam, annual eye examination, and good blood sugar, blood pressure and cholesterol control.     Latest Ref Rng & Units 03/22/2022    3:34 PM 10/24/2021    1:13 PM 10/24/2021   12:18 PM 04/07/2021   10:30 AM 11/17/2020   10:20 AM  Diabetic Labs  HbA1c 0.0 - 7.0 %  7.8   10.0  9.5   Chol 100 - 199 mg/dL   153  144    HDL >39 mg/dL   60  58    Calc LDL 0 - 99 mg/dL   70  66    Triglycerides 0 - 149 mg/dL   136  111    Creatinine 0.60 - 1.00 mg/dL 2.02   1.65  1.55        04/13/2022    8:59 AM 04/02/2022    2:57 PM 03/22/2022    2:32 PM 03/14/2022    1:03 PM 03/13/2022    2:17 PM 02/27/2022    2:16 PM 02/20/2022   10:23 AM  BP/Weight  Systolic BP 161 096 045  409 811 914  Diastolic BP 62 71 72  59 72 69  Wt. (Lbs) 258.12  254.3 249.12   249.04  BMI 39.25 kg/m2  38.67 kg/m2 37.88 kg/m2   37.87 kg/m2      Latest Ref Rng & Units 12/01/2021    9:40 AM 02/15/2021   12:00 AM  Foot/eye exam completion dates  Eye Exam No Retinopathy  Retinopathy      Foot Form Completion  Done      This result is from an external source.      Updated lab needed at/ before next visit. Managed by Endo

## 2022-04-16 ENCOUNTER — Encounter: Payer: Self-pay | Admitting: Family Medicine

## 2022-04-16 NOTE — Assessment & Plan Note (Signed)
Needs to commit to once and up to twice daily levbid as prescribed

## 2022-04-16 NOTE — Assessment & Plan Note (Signed)
Controlled, no change in medication DASH diet and commitment to daily physical activity for a minimum of 30 minutes discussed and encouraged, as a part of hypertension management. The importance of attaining a healthy weight is also discussed.     04/13/2022    8:59 AM 04/02/2022    2:57 PM 03/22/2022    2:32 PM 03/14/2022    1:03 PM 03/13/2022    2:17 PM 02/27/2022    2:16 PM 02/20/2022   10:23 AM  BP/Weight  Systolic BP 372 902 111  552 080 223  Diastolic BP 62 71 72  59 72 69  Wt. (Lbs) 258.12  254.3 249.12   249.04  BMI 39.25 kg/m2  38.67 kg/m2 37.88 kg/m2   37.87 kg/m2

## 2022-04-16 NOTE — Assessment & Plan Note (Signed)
Controlled, no change in medication  

## 2022-04-16 NOTE — Assessment & Plan Note (Signed)
Home safety reviewed briefly, no falls reported 

## 2022-04-16 NOTE — Assessment & Plan Note (Signed)
  Patient re-educated about  the importance of commitment to a  minimum of 150 minutes of exercise per week as able.  The importance of healthy food choices with portion control discussed, as well as eating regularly and within a 12 hour window most days. The need to choose "clean , green" food 50 to 75% of the time is discussed, as well as to make water the primary drink and set a goal of 64 ounces water daily.       04/13/2022    8:59 AM 03/22/2022    2:32 PM 03/14/2022    1:03 PM  Weight /BMI  Weight 258 lb 1.9 oz 254 lb 4.8 oz 249 lb 1.9 oz  Height 5\' 8"  (1.727 m) 5\' 8"  (1.727 m) 5\' 8"  (1.727 m)  BMI 39.25 kg/m2 38.67 kg/m2 37.88 kg/m2

## 2022-04-16 NOTE — Progress Notes (Signed)
Laura Atkins     MRN: [id number]      DOB: [birth year]   HPI Laura Atkins is here for follow up and re-evaluation of chronic medical conditions, medication management and review of any available recent lab and radiology data.  Preventive health is updated, specifically  Cancer screening and Immunization.   Questions or concerns regarding consultations or procedures which the PT has had in the interim are  addressed.Although dicyclomine is prescribed not taking regularly and still c/o cramping pain in the office, grand daughter will start giving her once daily Will follow through with epogen, but still being encouraged to keep open mind for complete GI eval, although bowel prep is a challenge The PT denies any adverse reactions to current medications since the last visit.  There are no new concerns.  There are no specific complaints   ROS Denies recent fever or chills. Denies sinus pressure, nasal congestion, ear pain or sore throat. Denies chest congestion, productive cough or wheezing. Denies chest pains, palpitations and leg swelling .   Denies dysuria, fDenies skin break down or rash.   PE  BP 130/62   Pulse (!) 59   Ht 5\' 8"  (1.727 m)   Wt 258 lb 1.9 oz (117.1 kg)   SpO2 98%   BMI 39.25 kg/m   Patient alert and oriented and in no cardiopulmonary distress.  HEENT: No facial asymmetry, EOMI,     Neck decreased rOM .  Chest: Clear to auscultation bilaterally.  CVS: S1, S2 no murmurs, no S3.Regular rate.  ABD: Soft non tender. No guarding or rebound  Ext: No edema  MS: decreased  ROM spine, shoulders, hips and knees.  Skin: Intact, no ulcerations or rash noted.  Psych: Good eye contact, normal affect.  not anxious or depressed appearing.  CNS: CN 2-12 intact, power,  normal throughout.no focal deficits noted.   Assessment & Plan  Type 2 diabetes mellitus with hyperglycemia ([name]) Laura Atkins is reminded of the importance of commitment to daily physical activity  for 30 minutes or more, as able and the need to limit carbohydrate intake to 30 to 60 grams per meal to help with blood sugar control.   The need to take medication as prescribed, test blood sugar as directed, and to call between visits if there is a concern that blood sugar is uncontrolled is also discussed.   Laura Atkins is reminded of the importance of daily foot exam, annual eye examination, and good blood sugar, blood pressure and cholesterol control.     Latest Ref Rng & Units 03/22/2022    3:34 PM 10/24/2021    1:13 PM 10/24/2021   12:18 PM 04/07/2021   10:30 AM 11/17/2020   10:20 AM  Diabetic Labs  HbA1c 0.0 - 7.0 %  7.8   10.0  9.5   Chol 100 - 199 mg/dL   153  144    HDL >39 mg/dL   60  58    Calc LDL 0 - 99 mg/dL   70  66    Triglycerides 0 - 149 mg/dL   136  111    Creatinine 0.60 - 1.00 mg/dL 2.02   1.65  1.55        04/13/2022    8:59 AM 04/02/2022    2:57 PM 03/22/2022    2:32 PM 03/14/2022    1:03 PM 03/13/2022    2:17 PM 02/27/2022    2:16 PM 02/20/2022   10:23 AM  BP/Weight  Systolic  BP 130 124 122  409 811 914  Diastolic BP 62 71 72  59 72 69  Wt. (Lbs) 258.12  254.3 249.12   249.04  BMI 39.25 kg/m2  38.67 kg/m2 37.88 kg/m2   37.87 kg/m2      Latest Ref Rng & Units 12/01/2021    9:40 AM 02/15/2021   12:00 AM  Foot/eye exam completion dates  Eye Exam No Retinopathy  Retinopathy      Foot Form Completion  Done      This result is from an external source.      Updated lab needed at/ before next visit. Managed by Endo  Essential hypertension Controlled, no change in medication DASH diet and commitment to daily physical activity for a minimum of 30 minutes discussed and encouraged, as a part of hypertension management. The importance of attaining a healthy weight is also discussed.     04/13/2022    8:59 AM 04/02/2022    2:57 PM 03/22/2022    2:32 PM 03/14/2022    1:03 PM 03/13/2022    2:17 PM 02/27/2022    2:16 PM 02/20/2022   10:23 AM  BP/Weight  Systolic BP  782 956 213  086 578 469  Diastolic BP 62 71 72  59 72 69  Wt. (Lbs) 258.12  254.3 249.12   249.04  BMI 39.25 kg/m2  38.67 kg/m2 37.88 kg/m2   37.87 kg/m2       Limited mobility Home safety reviewed briefly, no falls reported  Hyperlipidemia associated with type 2 diabetes mellitus ([place]) Hyperlipidemia:Low fat diet discussed and encouraged.   Lipid Panel  Lab Results  Component Value Date   CHOL 153 10/24/2021   HDL 60 10/24/2021   LDLCALC 70 10/24/2021   TRIG 136 10/24/2021   CHOLHDL 2.6 10/24/2021   Updated lab needed at/ before next visit. Controlled, no change in medication     Lower abdominal pain Needs to commit to once and up to twice daily levbid as prescribed  Anxiety and depression Controlled, no change in medication   Morbid obesity ([place])  Patient re-educated about  the importance of commitment to a  minimum of 150 minutes of exercise per week as able.  The importance of healthy food choices with portion control discussed, as well as eating regularly and within a 12 hour window most days. The need to choose "clean , green" food 50 to 75% of the time is discussed, as well as to make water the primary drink and set a goal of 64 ounces water daily.       04/13/2022    8:59 AM 03/22/2022    2:32 PM 03/14/2022    1:03 PM  Weight /BMI  Weight 258 lb 1.9 oz 254 lb 4.8 oz 249 lb 1.9 oz  Height 5\' 8"  (1.727 m) 5\' 8"  (1.727 m) 5\' 8"  (1.727 m)  BMI 39.25 kg/m2 38.67 kg/m2 37.88 kg/m2

## 2022-04-16 NOTE — Assessment & Plan Note (Signed)
Hyperlipidemia:Low fat diet discussed and encouraged.   Lipid Panel  Lab Results  Component Value Date   CHOL 153 10/24/2021   HDL 60 10/24/2021   LDLCALC 70 10/24/2021   TRIG 136 10/24/2021   CHOLHDL 2.6 10/24/2021   Updated lab needed at/ before next visit. Controlled, no change in medication

## 2022-04-17 ENCOUNTER — Encounter (HOSPITAL_COMMUNITY): Payer: Self-pay

## 2022-04-17 ENCOUNTER — Ambulatory Visit: Payer: Medicare Other | Admitting: Nurse Practitioner

## 2022-04-17 ENCOUNTER — Encounter (HOSPITAL_COMMUNITY)
Admission: RE | Admit: 2022-04-17 | Discharge: 2022-04-17 | Disposition: A | Discharge status: Home / Self Care | Payer: Medicare PPO | Source: Ambulatory Visit | Attending: Nephrology | Admitting: Nephrology

## 2022-04-17 VITALS — BP 124/61 | HR 51 | Temp 98.0°F | Resp 18 | Ht 68.0 in | Wt 258.1 lb

## 2022-04-17 DIAGNOSIS — N184 Chronic kidney disease, stage 4 (severe): Secondary | ICD-10-CM

## 2022-04-17 DIAGNOSIS — D631 Anemia in chronic kidney disease: Secondary | ICD-10-CM | POA: Diagnosis not present

## 2022-04-17 LAB — POCT HEMOGLOBIN-HEMACUE: Hemoglobin: 8.4 g/dL — ABNORMAL LOW (ref 12.0–15.0)

## 2022-04-17 MED ORDER — EPOETIN ALFA 10000 UNIT/ML IJ SOLN
10000.0000 [IU] | Freq: Once | INTRAMUSCULAR | Status: AC
  Administered 2022-04-17: 10000 [IU] via SUBCUTANEOUS
  Filled 2022-04-17: qty 1

## 2022-04-17 MED ORDER — EPOETIN ALFA-EPBX 10000 UNIT/ML IJ SOLN: 10000.0000 [IU] | Freq: Once | INTRAMUSCULAR | Status: DC

## 2022-04-18 ENCOUNTER — Ambulatory Visit (INDEPENDENT_AMBULATORY_CARE_PROVIDER_SITE_OTHER): Payer: Medicare PPO

## 2022-04-18 DIAGNOSIS — Z0000 Encounter for general adult medical examination without abnormal findings: Secondary | ICD-10-CM | POA: Diagnosis not present

## 2022-04-18 NOTE — Progress Notes (Signed)
Subjective:   Laura Atkins is a 81 y.o. female who presents for Medicare Annual (Subsequent) preventive examination.  Review of Systems    I connected with  Laura Atkins on 04/18/22 by a audio enabled telemedicine application and verified that I am speaking with the correct person using two identifiers.  Patient Location: Home  Provider Location: Office/Clinic  I discussed the limitations of evaluation and management by telemedicine. The patient expressed understanding and agreed to proceed.  Cardiac Risk Factors include: advanced age (>28mn, >>8women);diabetes mellitus     Objective:    Today's Vitals   04/18/22 1533  PainSc: 0-No pain   There is no height or weight on file to calculate BMI.     04/18/2022    3:40 PM 03/19/2022   10:36 AM 05/01/2021    4:29 PM 03/15/2021    2:54 PM 09/29/2020    1:06 PM 05/27/2020    8:17 PM 05/27/2020    1:06 PM  Advanced Directives  Does Patient Have a Medical Advance Directive? Yes _0  No  Does patient want to make changes to medical advance directive? No - Patient declined        Would patient like information on creating a medical advance directive? No - Patient declined Yes (ED - Information included in AVS) No - Patient declined No - Patient declined No - Patient declined No - Patient declined No - Patient declined    Current Medications (verified) Outpatient Encounter Medications as of 04/18/2022  Medication Sig   Accu-Chek FastClix Lancets MISC 1 each by Does not apply route 4 (four) times daily. Use to monitor glucose levels 4 times daily; E11.65   Alcohol Swabs (B-D SINGLE USE SWABS REGULAR) PADS 1 each by Does not apply route 4 (four) times daily. Use to prep site for glucose monitoring 4 times daily; E11.65   amLODipine (NORVASC) 5 MG tablet TAKE 1 TABLET BY MOUTH EVERY DAY   aspirin 81 MG tablet Take 81 mg by mouth daily.   atorvastatin (LIPITOR) 40 MG tablet TAKE 1 TABLET BY MOUTH EVERY DAY   benazepril  (LOTENSIN) 20 MG tablet TAKE 1 TABLET BY MOUTH EVERY DAY   Blood Glucose Monitoring Suppl (ACCU-CHEK GUIDE ME) w/Device KIT 1 each by Does not apply route 4 (four) times daily. Use to monitor glucose levels 4 times daily; E11.65   buPROPion (WELLBUTRIN XL) 150 MG 24 hr tablet Take 1 tablet (150 mg total) by mouth every morning.   Cholecalciferol 125 MCG (5000 UT) TABS Take 5,000 Units by mouth daily.   dicyclomine (BENTYL) 10 MG capsule Take 1 capsule (10 mg total) by mouth every 12 (twelve) hours as needed for spasms.   diphenhydrAMINE (BENADRYL) 25 MG tablet Take 25 mg by mouth at bedtime.   donepezil (ARICEPT) 5 MG tablet TAKE 1 TABLET BY MOUTH AT BEDTIME   gabapentin (NEURONTIN) 400 MG capsule TAKE ONE CAPSULE BY MOUTH EVERY MORNING and TAKE ONE CAPSULE AT NOON and TAKE TWO CAPSULES AT BEDTIME   gentamicin cream (GARAMYCIN) 0.1 % Apply 1 Application topically 2 (two) times daily.   glucose blood (ACCU-CHEK GUIDE) test strip USE TO TEST BLOOD SUGAR FOUR TIMES DAILY E11.65   glucose blood test strip 1 each by Other route in the morning, at noon, in the evening, and at bedtime. Use as instructed qid. E11.65   insulin degludec (TRESIBA FLEXTOUCH) 200 UNIT/ML FlexTouch Pen Inject 12 Units into the skin at bedtime.   Insulin  Pen Needle (PEN NEEDLES) 31G X 5 MM MISC Use to inject insulin once daily   Insulin Syringe-Needle U-100 (INSULIN SYRINGE 1CC/31GX5/16") 31G X 5/16" 1 ML MISC 1 each by Does not apply route daily. Use to inject insulin daily; E11.65   Multiple Vitamin (MULTIVITAMIN) capsule Take 1 capsule by mouth daily.   omeprazole (PRILOSEC) 20 MG capsule TAKE ONE CAPSULE BY MOUTH EVERY DAY   sertraline (ZOLOFT) 100 MG tablet Take 1 tablet (100 mg total) by mouth daily.   tiZANidine (ZANAFLEX) 2 MG tablet Take 1 tablet (2 mg total) by mouth every 6 (six) hours as needed for muscle spasms.   torsemide (DEMADEX) 20 MG tablet TAKE 2 TABLETS BY MOUTH EVERY DAY and additionally TAKE 1 TABLET EVERY  OTHER DAY   UNABLE TO FIND Walker x 1  DX unsteady gait, osteoarthritis of left knee   UNABLE TO FIND Elevated [place] Seat x 1 DX: unsteady gait, back pain, osteoarthritis of left knee   UNABLE TO FIND Standing upright walker x 1  DX M54.40, M19.90   UNABLE TO FIND Incontinence pads and supplies   UNABLE TO FIND Rollator Walker   UNABLE TO FIND XL Tranquility Briefs for incontinence.   Vibegron (GEMTESA) 75 MG TABS Take 75 mg by mouth daily.   No facility-administered encounter medications on file as of 04/18/2022.    Allergies (verified) Penicillins, Prednisone, Propoxyphene n-acetaminophen, and Sulfa antibiotics   History: Past Medical History:  Diagnosis Date   Alpha thalassemia trait 01/26/2010   02/2012: Nl CBC ex H&H-10.7/34.8, MCV-69    Anemia    Anxiety    Anxiety and depression    Cellulitis 05/09/2017   Depression    Diabetes mellitus    Foot pain, right 04/30/2013   GERD (gastroesophageal reflux disease)    Headache(784.0)    Hyperlipidemia    Hypertension    Iron deficiency 01/21/2017   Microcytic anemia 01/26/2010   02/2012: Nl CBC ex H&H-10.7/34.8, MCV-69    NECK PAIN, CHRONIC 10/21/2008   +chronic back pain     Obesity    Obstructive sleep apnea    Osteoarthritis    Left knee; right shoulder; chronic neck and back pain   Pruritus    PVD (peripheral vascular disease) ([place]) 01/28/2014   Seizures (HCC)    Shoulder pain, right 04/14/2015   Tremor    This started months ago after her seizure progressing to very poor hand writing   Urinary incontinence    UTI (urinary tract infection) 01/18/2013   Past Surgical History:  Procedure Laterality Date   ABDOMINAL HYSTERECTOMY     BREAST EXCISIONAL BIOPSY     Left; cyst   CATARACT EXTRACTION Right    12/2017   CATARACT EXTRACTION W/ INTRAOCULAR LENS IMPLANT Left 09/07/2013   CHOLECYSTECTOMY     COLONOSCOPY     COLONOSCOPY N/A 07/20/2015   Procedure: COLONOSCOPY;  Surgeon: Rogene Houston, MD;  Location:  [facility];  Service: Endoscopy;  Laterality: N/A;  930   EYE SURGERY Left 09/07/2013   cataract   Family History  Problem Relation Age of Onset   Lung cancer Mother 53   Kidney disease Father    Diabetes Sister    Keloids Brother    ADD / ADHD Grandchild    Bipolar disorder Grandchild    Bipolar disorder Daughter    Seizures Daughter    Heart disease Daughter    Kidney disease Son    Neuropathy Son    Kidney disease  Son    Edema Daughter    Breast cancer Daughter 71   Allergies Daughter    Alcohol abuse Neg Hx    Drug abuse Neg Hx    Social History   Socioeconomic History   Marital status: Married    Spouse name: saunders   Number of children: 5   Years of education: 12   Highest education level: Not on file  Occupational History   Occupation: Disabled     Employer: RETIRED  Tobacco Use   Smoking status: Never   Smokeless tobacco: Never  Vaping Use   Vaping Use: Never used  Substance and Sexual Activity   Alcohol use: No    Alcohol/week: 0.0 standard drinks of alcohol   Drug use: No   Sexual activity: Yes    Birth control/protection: Surgical  Other Topics Concern   Not on file  Social History Narrative   07/27/20 Patient lives at home with her husband [name] and dgtr- [name].  Patient is retired.    Right handed.    Five Children.    Caffeine- 2 daily   Social Determinants of Health   Financial Resource Strain: Low Risk  (04/18/2022)   Overall Financial Resource Strain (CARDIA)    Difficulty of Paying Living Expenses: Not hard at all  Food Insecurity: No Food Insecurity (04/18/2022)   Hunger Vital Sign    Worried About Running Out of Food in the Last Year: Never true    Ran Out of Food in the Last Year: Never true  Transportation Needs: No Transportation Needs (04/18/2022)   PRAPARE - Hydrologist (Medical): No    Lack of Transportation (Non-Medical): No  Physical Activity: Inactive (04/18/2022)   Exercise Vital Sign     Days of Exercise per Week: 0 days    Minutes of Exercise per Session: 0 min  Stress: No Stress Concern Present (04/18/2022)   [place]    Feeling of Stress : Not at all  Social Connections: Moderately Integrated (04/18/2022)   Social Connection and Isolation Panel [NHANES]    Frequency of Communication with Friends and Family: More than three times a week    Frequency of Social Gatherings with Friends and Family: More than three times a week    Attends Religious Services: More than 4 times per year    Active Member of [employer] or Organizations: No    Attends Archivist Meetings: Never    Marital Status: Married    Tobacco Counseling Counseling given: Not Answered   Clinical Intake:  Ms. Ehmann , Thank you for taking time to come for your Medicare Wellness Visit. I appreciate your ongoing commitment to your health goals. Please review the following plan we discussed and let me know if I can assist you in the future.   These are the goals we discussed:  Goals      Exercise 3x per week (30 min per time)     Recommend starting a routine exercise program at least 3 days a week for 30-45 minutes at a time as tolerated.       Medication Management     Patient Goals/Self-Care Activities Patient will:  Focus on medication adherence by keeping up with prescription refills and either using a pill box or reminders to take your medications at the prescribed times Check blood sugar once a day at the following times: fasting (at least 8 hours since last food consumption)  and whenever patient experiences symptoms of hypo/hyperglycemia, document, and provide at future appointments Check blood pressure at least once daily, document, and provide at future appointments        Patient Stated     Keep on living.Hopes to continue feeling better.     Prevent falls        This is a list of the screening recommended for  you and due dates:  Health Maintenance  Topic Date Due   Zoster (Shingles) Vaccine (1 of 2) Never done   COVID-19 Vaccine (3 - Mixed Product risk series) 11/23/2019   Eye exam for diabetics  02/15/2022   Hemoglobin A1C  04/23/2022   Flu Shot  04/24/2022   Complete foot exam   12/02/2022   Tetanus Vaccine  07/17/2028   Pneumonia Vaccine  Completed   DEXA scan (bone density measurement)  Completed   Hepatitis C Screening: USPSTF Recommendation to screen - Ages 12-79 yo.  Completed   HPV Vaccine  Aged Out   Colon Cancer Screening  Discontinued    Pre-visit preparation completed: Yes  Pain : No/denies pain Pain Score: 0-No pain     BMI - recorded: 39.25 Nutritional Status: BMI > 30  Obese Nutritional Risks: None Diabetes: Yes CBG done?: No Did pt. bring in CBG monitor from home?: No  How often do you need to have someone help you when you read instructions, pamphlets, or other written materials from your doctor or pharmacy?: 1 - Never What is the last grade level you completed in school?: 12+  Diabetic?Yes Nutrition Risk Assessment:  Has the patient had any N/V/D within the last 2 months?  Yes  Does the patient have any non-healing wounds?  No  Has the patient had any unintentional weight loss or weight gain?  No   Diabetes:  Is the patient diabetic?  Yes  If diabetic, was a CBG obtained today?  No  Did the patient bring in their glucometer from home?  No  How often do you monitor your CBG's? Multiple times daily.   Financial Strains and Diabetes Management:  Are you having any financial strains with the device, your supplies or your medication? No .  Does the patient want to be seen by Chronic Care Management for management of their diabetes?  No  Would the patient like to be referred to a Nutritionist or for Diabetic Management?  No   Diabetic Exams:  Diabetic Eye Exam: Completed 02/15/21 Foot Exam: Completed 12/01/21 Interpreter Needed?: No      Activities  of Daily Living    04/18/2022    3:40 PM 03/13/2022    2:18 PM  In your present state of health, do you have any difficulty performing the following activities:  Hearing? 0 0  Vision? 0 0  Difficulty concentrating or making decisions? 0 0  Walking or climbing stairs? 1   Dressing or bathing? 0 0  Doing errands, shopping? 1   Preparing Food and eating ? N   Using the Toilet? N   In the past six months, have you accidently leaked urine? N   Do you have problems with loss of bowel control? N   Managing your Medications? Y   Managing your Finances? Y   Housekeeping or managing your Housekeeping? Y     Patient Care Team: Fayrene Helper, MD as PCP - General Branch, Alphonse Guild, MD as PCP - Cardiology (Cardiology) Marcial Pacas, MD as Consulting Physician (Neurology) Renato Shin, MD (Inactive) as Consulting  Physician (Endocrinology) Cloria Spring, MD as Consulting Physician ([place]) Alonza Smoker, LCSW as Social Worker (Psychiatry) Clent Jacks, MD as Consulting Physician (Ophthalmology) Beryle Lathe, [facility] (Inactive) (Pharmacist)  Indicate any recent Medical Services you may have received from other than [facility] providers in the past year (date may be approximate).     Assessment:   This is a routine wellness examination for [place].  Hearing/Vision screen No results found.  Dietary issues and exercise activities discussed: Current Exercise Habits: The patient does not participate in regular exercise at present, Exercise limited by: None identified   Goals Addressed             This Visit's Progress    Patient Stated       Keep on living.Hopes to continue feeling better.      Depression Screen    04/18/2022    3:40 PM 04/18/2022    3:38 PM 04/13/2022    9:01 AM 02/20/2022   10:24 AM 01/18/2022    3:00 PM 12/01/2021    9:43 AM 10/12/2021   11:21 AM  PHQ 2/9 Scores  PHQ - 2 Score 0 0 0 1  0   PHQ- 9 Score         Exception Documentation      Other- indicate reason in comment box       Information is confidential and restricted. Go to Review Flowsheets to unlock data.    Fall Risk    04/18/2022    3:40 PM 04/13/2022    9:00 AM 02/20/2022   10:24 AM 01/18/2022    3:00 PM 12/01/2021    9:43 AM  Fall Risk   Falls in the past year? 0 0 0 0 0  Number falls in past yr: 0 0 0 0 0  Injury with Fall? 0 0 0 0 0  Risk for fall due to : No Fall Risks History of fall(s) No Fall Risks No Fall Risks   Follow up Falls evaluation completed Falls evaluation completed Falls evaluation completed Falls evaluation completed     [place]:  Any stairs in or around the home? No  If so, are there any without handrails? Yes  Home free of loose throw rugs in walkways, pet beds, electrical cords, etc? Yes  Adequate lighting in your home to reduce risk of falls? Yes   ASSISTIVE DEVICES UTILIZED TO PREVENT FALLS:  Life alert? No  Use of a cane, walker or w/c? Yes  Grab bars in the bathroom? Yes  Shower chair or bench in shower? Yes  Elevated toilet seat or a handicapped toilet? Yes    Cognitive Function:    04/18/2022    3:41 PM 07/27/2020    1:57 PM 07/13/2020    4:22 PM  MMSE - Mini Mental State Exam  Not completed: Unable to complete    Orientation to time  3 4  Orientation to Place  4 5  Registration  3 3  Attention/ Calculation  2 5  Recall  0 0  Language- name 2 objects  2 2  Language- repeat  0 1  Language- follow 3 step command  3 2  Language- read & follow direction  1 1  Write a sentence  1 0  Copy design  1 0  Copy design-comments  hand shakes   Total score  20 23        04/18/2022    3:41 PM 02/26/2019  11:07 AM 02/24/2018   10:41 AM 02/11/2017   10:30 AM  6CIT Screen  What Year? 0 points 0 points 0 points 0 points  What month? 0 points 0 points 0 points 0 points  What time? 0 points 0 points 0 points 0 points  Count back from 20 0 points 0 points 0 points 0 points  Months in  reverse 2 points 0 points 0 points 0 points  Repeat phrase 2 points 2 points 2 points 0 points  Total Score 4 points 2 points 2 points 0 points    Immunizations Immunization History  Administered Date(s) Administered   Fluad Quad(high Dose 65+) 05/20/2019   H1N1 07/15/2008   Influenza Split 06/17/2012   Influenza Whole 07/03/2007, 06/17/2009, 06/07/2011   Influenza, High Dose Seasonal PF 07/17/2018   Influenza,inj,Quad PF,6+ Mos 07/13/2013, 09/06/2014, 08/10/2015, 05/23/2016, 05/22/2017   Pneumococcal Conjugate-13 05/03/2014   Pneumococcal Polysaccharide-23 09/08/2010   Td 09/08/2010   Tdap 07/17/2018   Unspecified SARS-COV-2 Vaccination 09/25/2019, 10/26/2019    TDAP status: Up to date  Flu Vaccine status: Up to date  Pneumococcal vaccine status: Up to date  Covid-19 vaccine status: Completed vaccines  Qualifies for Shingles Vaccine? Yes   Zostavax completed No   Shingrix Completed?: No.    Education has been provided regarding the importance of this vaccine. Patient has been advised to call insurance company to determine out of pocket expense if they have not yet received this vaccine. Advised may also receive vaccine at local pharmacy or Health Dept. Verbalized acceptance and understanding.  Screening Tests Health Maintenance  Topic Date Due   Zoster Vaccines- Shingrix (1 of 2) Never done   COVID-19 Vaccine (3 - Mixed Product risk series) 11/23/2019   OPHTHALMOLOGY EXAM  02/15/2022   HEMOGLOBIN A1C  04/23/2022   INFLUENZA VACCINE  04/24/2022   FOOT EXAM  12/02/2022   TETANUS/TDAP  07/17/2028   Pneumonia Vaccine 23+ Years old  Completed   DEXA SCAN  Completed   Hepatitis C Screening  Completed   HPV VACCINES  Aged Out   COLONOSCOPY (Pts 45-37yr Insurance coverage will need to be confirmed)  Discontinued    Health Maintenance  Lung Cancer Screening: (Low Dose CT Chest recommended if Age 306-80years, 30 pack-year currently smoking OR have quit w/in 15years.) does  not qualify.   Lung Cancer Screening Referral: no  Additional Screening:  Hepatitis C Screening: does qualify; Completed 04/07/21  Vision Screening: Recommended annual ophthalmology exams for early detection of glaucoma and other disorders of the eye. Is the patient up to date with their annual eye exam?  No  Who is the provider or what is the name of the office in which the patient attends annual eye exams? N/a If pt is not established with a provider, would they like to be referred to a provider to establish care? No .   Dental Screening: Recommended annual dental exams for proper oral hygiene  Community Resource Referral / Chronic Care Management: CRR required this visit?  No   CCM required this visit?  No      Plan:     I have personally reviewed and noted the following in the patient's chart:   Medical and social history Use of alcohol, tobacco or illicit drugs  Current medications and supplements including opioid prescriptions.  Functional ability and status Nutritional status Physical activity Advanced directives List of other physicians Hospitalizations, surgeries, and ER visits in previous 12 months Vitals Screenings to include cognitive, depression, and  falls Referrals and appointments  In addition, I have reviewed and discussed with patient certain preventive protocols, quality metrics, and best practice recommendations. A written personalized care plan for preventive services as well as general preventive health recommendations were provided to patient.     Quentin Angst, [place]   04/18/2022

## 2022-04-18 NOTE — Patient Instructions (Signed)
  Ms. Fullenwider , Thank you for taking time to come for your Medicare Wellness Visit. I appreciate your ongoing commitment to your health goals. Please review the following plan we discussed and let me know if I can assist you in the future.   These are the goals we discussed:  Goals      Exercise 3x per week (30 min per time)     Recommend starting a routine exercise program at least 3 days a week for 30-45 minutes at a time as tolerated.       Medication Management     Patient Goals/Self-Care Activities Patient will:  Focus on medication adherence by keeping up with prescription refills and either using a pill box or reminders to take your medications at the prescribed times Check blood sugar once a day at the following times: fasting (at least 8 hours since last food consumption) and whenever patient experiences symptoms of hypo/hyperglycemia, document, and provide at future appointments Check blood pressure at least once daily, document, and provide at future appointments        Patient Stated     Keep on living.Hopes to continue feeling better.     Prevent falls        This is a list of the screening recommended for you and due dates:  Health Maintenance  Topic Date Due   Zoster (Shingles) Vaccine (1 of 2) Never done   COVID-19 Vaccine (3 - Mixed Product risk series) 11/23/2019   Eye exam for diabetics  02/15/2022   Hemoglobin A1C  04/23/2022   Flu Shot  04/24/2022   Complete foot exam   12/02/2022   Tetanus Vaccine  07/17/2028   Pneumonia Vaccine  Completed   DEXA scan (bone density measurement)  Completed   Hepatitis C Screening: USPSTF Recommendation to screen - Ages 51-79 yo.  Completed   HPV Vaccine  Aged Out   Colon Cancer Screening  Discontinued

## 2022-04-25 ENCOUNTER — Ambulatory Visit: Payer: Medicare Other | Admitting: Nurse Practitioner

## 2022-04-29 ENCOUNTER — Other Ambulatory Visit: Payer: Self-pay | Admitting: Family Medicine

## 2022-05-01 ENCOUNTER — Other Ambulatory Visit: Payer: Self-pay | Admitting: *Deleted

## 2022-05-01 ENCOUNTER — Encounter (HOSPITAL_COMMUNITY)
Admission: RE | Admit: 2022-05-01 | Discharge: 2022-05-01 | Disposition: A | Discharge status: Home / Self Care | Payer: Medicare PPO | Source: Ambulatory Visit | Attending: Nephrology | Admitting: Nephrology

## 2022-05-01 DIAGNOSIS — D631 Anemia in chronic kidney disease: Secondary | ICD-10-CM | POA: Insufficient documentation

## 2022-05-01 DIAGNOSIS — N184 Chronic kidney disease, stage 4 (severe): Secondary | ICD-10-CM | POA: Diagnosis present

## 2022-05-01 DIAGNOSIS — I739 Peripheral vascular disease, unspecified: Secondary | ICD-10-CM

## 2022-05-01 LAB — CBC
HCT: 29.8 % — ABNORMAL LOW (ref 36.0–46.0)
Hemoglobin: 8.5 g/dL — ABNORMAL LOW (ref 12.0–15.0)
MCH: 20.7 pg — ABNORMAL LOW (ref 26.0–34.0)
MCHC: 28.5 g/dL — ABNORMAL LOW (ref 30.0–36.0)
MCV: 72.5 fL — ABNORMAL LOW (ref 80.0–100.0)
Platelets: 183 10*3/uL (ref 150–400)
RBC: 4.11 MIL/uL (ref 3.87–5.11)
RDW: 16.1 % — ABNORMAL HIGH (ref 11.5–15.5)
WBC: 5.5 10*3/uL (ref 4.0–10.5)
nRBC: 0 % (ref 0.0–0.2)

## 2022-05-01 LAB — RENAL FUNCTION PANEL
Albumin: 3.8 g/dL (ref 3.5–5.0)
Anion gap: 8 (ref 5–15)
BUN: 49 mg/dL — ABNORMAL HIGH (ref 8–23)
CO2: 29 mmol/L (ref 22–32)
Calcium: 9.1 mg/dL (ref 8.9–10.3)
Chloride: 103 mmol/L (ref 98–111)
Creatinine, Ser: 1.71 mg/dL — ABNORMAL HIGH (ref 0.44–1.00)
GFR, Estimated: 30 mL/min — ABNORMAL LOW (ref >60)
Glucose, Bld: 109 mg/dL — ABNORMAL HIGH (ref 70–99)
Phosphorus: 3.9 mg/dL (ref 2.5–4.6)
Potassium: 4.1 mmol/L (ref 3.5–5.1)
Sodium: 140 mmol/L (ref 135–145)

## 2022-05-01 LAB — POCT HEMOGLOBIN-HEMACUE: Hemoglobin: 8.7 g/dL — ABNORMAL LOW (ref 12.0–15.0)

## 2022-05-01 LAB — PROTEIN / CREATININE RATIO, URINE
Creatinine, Urine: 30.51 mg/dL
Total Protein, Urine: <6 mg/dL

## 2022-05-01 MED ORDER — EPOETIN ALFA-EPBX 10000 UNIT/ML IJ SOLN: 10000.0000 [IU] | Freq: Once | INTRAMUSCULAR | Status: DC

## 2022-05-01 MED ORDER — EPOETIN ALFA 10000 UNIT/ML IJ SOLN
INTRAMUSCULAR | Status: AC
  Administered 2022-05-01: 10000 [IU]
  Filled 2022-05-01: qty 1

## 2022-05-02 ENCOUNTER — Ambulatory Visit: Payer: Medicare PPO | Admitting: Vascular Surgery

## 2022-05-02 ENCOUNTER — Encounter: Payer: Self-pay | Admitting: Vascular Surgery

## 2022-05-02 ENCOUNTER — Ambulatory Visit (INDEPENDENT_AMBULATORY_CARE_PROVIDER_SITE_OTHER): Payer: Medicare PPO

## 2022-05-02 VITALS — BP 144/74 | HR 56 | Temp 98.4°F | Resp 16 | Ht 68.0 in | Wt 254.0 lb

## 2022-05-02 DIAGNOSIS — I739 Peripheral vascular disease, unspecified: Secondary | ICD-10-CM | POA: Diagnosis not present

## 2022-05-02 DIAGNOSIS — L97501 Non-pressure chronic ulcer of other part of unspecified foot limited to breakdown of skin: Secondary | ICD-10-CM

## 2022-05-02 NOTE — Progress Notes (Signed)
[facility] [place]  Patient name: Laura Atkins MRN: [id number] DOB: Feb 23, 1941 Sex: female  REASON FOR CONSULT: Evaluation toe ulceration and adequacy of arterial flow  HPI: Laura Atkins is a 81 y.o. female, who is here today for evaluation of arterial flow to both lower extremities.  She is here today with her granddaughter and also discussing with her daughter on the telephone.  She was seen 3 years ago in our office in [place] by Dr. Carlis Abbott.  At that time she had a slowly healing venous ulcer pretibial area.  She had calcified vessels but no evidence of arterial insufficiency at that time.  She had eventual healing.  She presents now for evaluation of ulceration of the toes.  She has had history of peripheral neuropathy and this reports sensation in her feet.  She had ulceration over her right great toenail and also callus ulceration over the tip of her left great toe.  She has also had progressive swelling over time as well.  She does not have any rest pain.  Past Medical History:  Diagnosis Date   Alpha thalassemia trait 01/26/2010   02/2012: Nl CBC ex H&H-10.7/34.8, MCV-69    Anemia    Anxiety    Anxiety and depression    Cellulitis 05/09/2017   Depression    Diabetes mellitus    Foot pain, right 04/30/2013   GERD (gastroesophageal reflux disease)    Headache(784.0)    Hyperlipidemia    Hypertension    Iron deficiency 01/21/2017   Microcytic anemia 01/26/2010   02/2012: Nl CBC ex H&H-10.7/34.8, MCV-69    NECK PAIN, CHRONIC 10/21/2008   +chronic back pain     Obesity    Obstructive sleep apnea    Osteoarthritis    Left knee; right shoulder; chronic neck and back pain   Pruritus    PVD (peripheral vascular disease) ([place]) 01/28/2014   Seizures (HCC)    Shoulder pain, right 04/14/2015   Tremor    This started months ago after her seizure progressing to very poor hand writing   Urinary incontinence    UTI  (urinary tract infection) 01/18/2013    Family History  Problem Relation Age of Onset   Lung cancer Mother 34   Kidney disease Father    Diabetes Sister    Keloids Brother    ADD / ADHD Grandchild    Bipolar disorder Grandchild    Bipolar disorder Daughter    Seizures Daughter    Heart disease Daughter    Kidney disease Son    Neuropathy Son    Kidney disease Son    Edema Daughter    Breast cancer Daughter 50   Allergies Daughter    Alcohol abuse Neg Hx    Drug abuse Neg Hx     SOCIAL HISTORY: Social History   Socioeconomic History   Marital status: Married    Spouse name: saunders   Number of children: 5   Years of education: 12   Highest education level: Not on file  Occupational History   Occupation: Disabled     Employer: RETIRED  Tobacco Use   Smoking status: Never   Smokeless tobacco: Never  Vaping Use   Vaping Use: Never used  Substance and Sexual Activity   Alcohol use: No    Alcohol/week: 0.0 standard drinks of alcohol   Drug use: No   Sexual activity: Yes    Birth control/protection: Surgical  Other Topics Concern   Not  on file  Social History Narrative   07/27/20 Patient lives at home with her husband [name] and dgtrIvin Booty.  Patient is retired.    Right handed.    Five Children.    Caffeine- 2 daily   Social Determinants of Health   Financial Resource Strain: Low Risk  (04/18/2022)   Overall Financial Resource Strain (CARDIA)    Difficulty of Paying Living Expenses: Not hard at all  Food Insecurity: No Food Insecurity (04/18/2022)   Hunger Vital Sign    Worried About Running Out of Food in the Last Year: Never true    Ran Out of Food in the Last Year: Never true  Transportation Needs: No Transportation Needs (04/18/2022)   PRAPARE - Hydrologist (Medical): No    Lack of Transportation (Non-Medical): No  Physical Activity: Inactive (04/18/2022)   Exercise Vital Sign    Days of Exercise per Week: 0 days     Minutes of Exercise per Session: 0 min  Stress: No Stress Concern Present (04/18/2022)   [place]    Feeling of Stress : Not at all  Social Connections: Moderately Integrated (04/18/2022)   Social Connection and Isolation Panel [NHANES]    Frequency of Communication with Friends and Family: More than three times a week    Frequency of Social Gatherings with Friends and Family: More than three times a week    Attends Religious Services: More than 4 times per year    Active Member of [employer] or Organizations: No    Attends Archivist Meetings: Never    Marital Status: Married  Human resources officer Violence: Not At Risk (04/18/2022)   Humiliation, Afraid, Rape, and Kick questionnaire    Fear of Current or Ex-Partner: No    Emotionally Abused: No    Physically Abused: No    Sexually Abused: No    Allergies  Allergen Reactions   Penicillins Shortness Of Breath, Itching and Rash   Prednisone Shortness Of Breath, Itching and Rash   Propoxyphene N-Acetaminophen Itching and Nausea And Vomiting   Sulfa Antibiotics Itching and Nausea And Vomiting    Current Outpatient Medications  Medication Sig Dispense Refill   Accu-Chek FastClix Lancets MISC 1 each by Does not apply route 4 (four) times daily. Use to monitor glucose levels 4 times daily; E11.65 306 each 2   Alcohol Swabs (B-D SINGLE USE SWABS REGULAR) PADS 1 each by Does not apply route 4 (four) times daily. Use to prep site for glucose monitoring 4 times daily; E11.65 300 each 2   amLODipine (NORVASC) 5 MG tablet TAKE 1 TABLET BY MOUTH EVERY DAY 30 tablet 3   aspirin 81 MG tablet Take 81 mg by mouth daily.     atorvastatin (LIPITOR) 40 MG tablet TAKE 1 TABLET BY MOUTH EVERY DAY 30 tablet 2   benazepril (LOTENSIN) 20 MG tablet TAKE 1 TABLET BY MOUTH EVERY DAY 30 tablet 5   Blood Glucose Monitoring Suppl (ACCU-CHEK GUIDE ME) w/Device KIT 1 each by Does not apply route 4  (four) times daily. Use to monitor glucose levels 4 times daily; E11.65 1 kit 0   buPROPion (WELLBUTRIN XL) 150 MG 24 hr tablet Take 1 tablet (150 mg total) by mouth every morning. 90 tablet 2   Cholecalciferol 125 MCG (5000 UT) TABS Take 5,000 Units by mouth daily.     dicyclomine (BENTYL) 10 MG capsule Take 1 capsule (10 mg total) by mouth  every 12 (twelve) hours as needed for spasms. 180 capsule 1   diphenhydrAMINE (BENADRYL) 25 MG tablet Take 25 mg by mouth at bedtime.     donepezil (ARICEPT) 5 MG tablet TAKE 1 TABLET BY MOUTH AT BEDTIME 30 tablet 2   gabapentin (NEURONTIN) 400 MG capsule TAKE ONE CAPSULE BY MOUTH EVERY MORNING and TAKE ONE CAPSULE AT NOON and TAKE TWO CAPSULES AT BEDTIME 360 capsule 1   gentamicin cream (GARAMYCIN) 0.1 % Apply 1 Application topically 2 (two) times daily. 30 g 1   glucose blood (ACCU-CHEK GUIDE) test strip USE TO TEST BLOOD SUGAR FOUR TIMES DAILY E11.65 200 strip 1   glucose blood test strip 1 each by Other route in the morning, at noon, in the evening, and at bedtime. Use as instructed qid. E11.65     insulin degludec (TRESIBA FLEXTOUCH) 200 UNIT/ML FlexTouch Pen Inject 12 Units into the skin at bedtime. 6 mL 3   Insulin Pen Needle (PEN NEEDLES) 31G X 5 MM MISC Use to inject insulin once daily 100 each 4   Insulin Syringe-Needle U-100 (INSULIN SYRINGE 1CC/31GX5/16") 31G X 5/16" 1 ML MISC 1 each by Does not apply route daily. Use to inject insulin daily; E11.65 90 each 2   Multiple Vitamin (MULTIVITAMIN) capsule Take 1 capsule by mouth daily.     omeprazole (PRILOSEC) 20 MG capsule TAKE ONE CAPSULE BY MOUTH EVERY DAY 30 capsule 2   sertraline (ZOLOFT) 100 MG tablet Take 1 tablet (100 mg total) by mouth daily. 90 tablet 2   tiZANidine (ZANAFLEX) 2 MG tablet Take 1 tablet (2 mg total) by mouth every 6 (six) hours as needed for muscle spasms. 20 tablet 0   torsemide (DEMADEX) 20 MG tablet TAKE 2 TABLETS BY MOUTH EVERY DAY and additionally TAKE 1 TABLET EVERY OTHER  DAY 225 tablet 2   UNABLE TO FIND Walker x 1  DX unsteady gait, osteoarthritis of left knee 1 each 0   UNABLE TO FIND Elevated [place] Seat x 1 DX: unsteady gait, back pain, osteoarthritis of left knee 1 each 0   UNABLE TO FIND Standing upright walker x 1  DX M54.40, M19.90 1 each 0   UNABLE TO FIND Incontinence pads and supplies 1 each 1   UNABLE TO FIND Rollator Walker 1 Product 0   UNABLE TO FIND XL Tranquility Briefs for incontinence. 1 Package 3   Vibegron (GEMTESA) 75 MG TABS Take 75 mg by mouth daily. 28 tablet 0   No current facility-administered medications for this visit.    REVIEW OF SYSTEMS:  [X]  denotes positive finding, [ ]  denotes negative finding Cardiac  Comments:  Chest pain or chest pressure:    Shortness of breath upon exertion: x   Short of breath when lying flat:    Irregular heart rhythm:        Vascular    Pain in calf, thigh, or hip brought on by ambulation:    Pain in feet at night that wakes you up from your sleep:  x   Blood clot in your veins:    Leg swelling:  x       Pulmonary    Oxygen at home:    Productive cough:     Wheezing:         Neurologic    Sudden weakness in arms or legs:     Sudden numbness in arms or legs:     Sudden onset of difficulty speaking or slurred speech:  Temporary loss of vision in one eye:     Problems with dizziness:         Gastrointestinal    Blood in stool:     Vomited blood:         Genitourinary    Burning when urinating:     Blood in urine:        Psychiatric    Major depression:  x       Hematologic    Bleeding problems:    Problems with blood clotting too easily:        Skin    Rashes or ulcers:        Constitutional    Fever or chills: x     PHYSICAL EXAM: Vitals:   05/02/22 1314  BP: (!) 144/74  Pulse: (!) 56  Resp: 16  Temp: 98.4 F (36.9 C)  TempSrc: Temporal  SpO2: 93%  Weight: 254 lb (115.2 kg)  Height: 5' 8"  (1.727 m)    GENERAL: The patient is a well-nourished  female, in no acute distress. The vital signs are documented above. CARDIOVASCULAR: 2+ radial and 2+ dorsalis pedis pulses bilaterally.  Marked swelling on the foot and calf and ankle on both lower extremities. PULMONARY: There is good air exchange  MUSCULOSKELETAL: There are no major deformities or cyanosis. NEUROLOGIC: No focal weakness or paresthesias are detected. SKIN: There are no ulcers or rashes noted.  She does not have any open ulcerations on her feet or legs.  Her right great toenail is absent no evidence of infection.  She does have a callus formation on the tip of her left great toe PSYCHIATRIC: The patient has a normal affect.  DATA:  Noninvasive studies today reveal calcified vessels bilaterally making ankle arm indices not possible.  She does have multiphasic waveforms bilaterally  MEDICAL ISSUES: I do not see any evidence of lower extremity arterial sufficiency.  I reassured the patient and her family that I do not feel that she is at any risk for limb loss.  She does have evidence of chronic venous stasis disease.  She is been unable to be compliant with elevation and I suspect this is the case.  She is heavyset and in a wheelchair and I think it would be difficult for her to keep her legs elevated higher than the heart.  I did again discuss the importance of compression either with support stockings or with Ace wrap wrapping beginning on her dorsum of her foot and extending to the below-knee position.  I recommended they do this daily pursing in the morning to control the swelling which will aid in healing as well.  Will see [place] again on an as-needed basis   Rosetta Posner, MD [facility] [phone] Pager [phone]  Note: Portions of this report may have been transcribed using voice recognition software.  Every effort has been made to ensure accuracy; however, inadvertent computerized transcription errors may still be  present.

## 2022-05-07 ENCOUNTER — Ambulatory Visit (INDEPENDENT_AMBULATORY_CARE_PROVIDER_SITE_OTHER): Payer: Medicare PPO | Admitting: Podiatry

## 2022-05-07 DIAGNOSIS — L97522 Non-pressure chronic ulcer of other part of left foot with fat layer exposed: Secondary | ICD-10-CM | POA: Diagnosis not present

## 2022-05-07 NOTE — Progress Notes (Signed)
Chief Complaint  Patient presents with   Ulcer    Ulcer of great toe, left foot great toe and the ulcer is healing very good.    Subjective:  81 y.o. female with PMHx of diabetes mellitus presenting today for follow-up evaluation of an ulcer to the left great toe.  Patient has been applying the antibiotic cream with a light dressing.  She states that she has not been going barefoot.  Overall improvement.  No new complaints at this time   Past Medical History:  Diagnosis Date   Alpha thalassemia trait 01/26/2010   02/2012: Nl CBC ex H&H-10.7/34.8, MCV-69    Anemia    Anxiety    Anxiety and depression    Cellulitis 05/09/2017   Depression    Diabetes mellitus    Foot pain, right 04/30/2013   GERD (gastroesophageal reflux disease)    Headache(784.0)    Hyperlipidemia    Hypertension    Iron deficiency 01/21/2017   Microcytic anemia 01/26/2010   02/2012: Nl CBC ex H&H-10.7/34.8, MCV-69    NECK PAIN, CHRONIC 10/21/2008   +chronic back pain     Obesity    Obstructive sleep apnea    Osteoarthritis    Left knee; right shoulder; chronic neck and back pain   Pruritus    PVD (peripheral vascular disease) ([place]) 01/28/2014   Seizures (HCC)    Shoulder pain, right 04/14/2015   Tremor    This started months ago after her seizure progressing to very poor hand writing   Urinary incontinence    UTI (urinary tract infection) 01/18/2013    Past Surgical History:  Procedure Laterality Date   ABDOMINAL HYSTERECTOMY     BREAST EXCISIONAL BIOPSY     Left; cyst   CATARACT EXTRACTION Right    12/2017   CATARACT EXTRACTION W/ INTRAOCULAR LENS IMPLANT Left 09/07/2013   CHOLECYSTECTOMY     COLONOSCOPY     COLONOSCOPY N/A 07/20/2015   Procedure: COLONOSCOPY;  Surgeon: Rogene Houston, MD;  Location: [facility];  Service: Endoscopy;  Laterality: N/A;  930   EYE SURGERY Left 09/07/2013   cataract    Allergies  Allergen Reactions   Penicillins Shortness Of Breath, Itching and Rash    Prednisone Shortness Of Breath, Itching and Rash   Propoxyphene N-Acetaminophen Itching and Nausea And Vomiting   Sulfa Antibiotics Itching and Nausea And Vomiting     Objective/Physical Exam General: The patient is alert and oriented x3 in no acute distress.  Dermatology:  The wound noted to the distal tip of the left great toe appears to be resolved.  There is some callus tissue around the area but there is no open wound after debridement.  Healthy underlying skin.  There is also a preulcerative callus lesion to the distal tip of the right great toe as well  Vascular: Chronic edema noted bilateral lower extremities  VAS [place] ABI W/WO TBI 03/10/2019 Summary:  Right: Resting right ankle-brachial index indicates noncompressible right  lower extremity arteries.The right toe-brachial index is normal. RT great  toe pressure = 122 mmHg.   Triphasic waveforms.  Left: Resting left ankle-brachial index indicates noncompressible left  lower extremity arteries.The left toe-brachial index is normal. LT Great  toe pressure = 131 mmHg.   Triphasic waveforms.  Neurological: Epicritic and protective threshold diminished bilaterally.   Musculoskeletal Exam: No pedal deformities.  No prior amputations  Assessment: 1.  Ulcer left great toe secondary to diabetes mellitus; healed 2. diabetes mellitus w/ peripheral neuropathy  Plan of Care:  1. Patient was evaluated. 2.  Light debridement of the hyperkeratotic skin to the bilateral great toes was performed today using a 312 scalpel without incident or bleeding. 3.  Continue to wear good supportive shoes and sneakers.  Advised against going barefoot 4.  Return to clinic 3 months for routine foot care  Edrick Kins, DPM [facility]  Dr. Edrick Kins, DPM    [address], [place] [address]                Office [phone]  Fax [phone]

## 2022-05-10 ENCOUNTER — Ambulatory Visit (INDEPENDENT_AMBULATORY_CARE_PROVIDER_SITE_OTHER): Payer: Medicare PPO | Admitting: Nurse Practitioner

## 2022-05-10 ENCOUNTER — Encounter: Payer: Self-pay | Admitting: Nurse Practitioner

## 2022-05-10 VITALS — BP 126/69 | HR 80 | Ht 68.0 in | Wt 254.0 lb

## 2022-05-10 DIAGNOSIS — E782 Mixed hyperlipidemia: Secondary | ICD-10-CM

## 2022-05-10 DIAGNOSIS — E1122 Type 2 diabetes mellitus with diabetic chronic kidney disease: Secondary | ICD-10-CM

## 2022-05-10 DIAGNOSIS — N1832 Chronic kidney disease, stage 3b: Secondary | ICD-10-CM

## 2022-05-10 DIAGNOSIS — I10 Essential (primary) hypertension: Secondary | ICD-10-CM

## 2022-05-10 DIAGNOSIS — Z794 Long term (current) use of insulin: Secondary | ICD-10-CM

## 2022-05-10 LAB — POCT GLYCOSYLATED HEMOGLOBIN (HGB A1C): HbA1c POC (<> result, manual entry): 6.9 % (ref 4.0–5.6)

## 2022-05-10 MED ORDER — TRESIBA FLEXTOUCH 200 UNIT/ML ~~LOC~~ SOPN: 12.0000 [IU] | PEN_INJECTOR | Freq: Every evening | SUBCUTANEOUS | 3 refills | Status: DC

## 2022-05-10 NOTE — Progress Notes (Signed)
Endocrinology Follow Up Note       05/10/2022, 2:27 PM   Subjective:    Patient ID: Laura Atkins, female    DOB: December 21, 1940.  Laura Atkins is being seen in follow up after being seen in consultation for management of currently uncontrolled symptomatic diabetes requested by  Fayrene Helper, MD.   Past Medical History:  Diagnosis Date   Alpha thalassemia trait 01/26/2010   02/2012: Nl CBC ex H&H-10.7/34.8, MCV-69    Anemia    Anxiety    Anxiety and depression    Cellulitis 05/09/2017   Depression    Diabetes mellitus    Foot pain, right 04/30/2013   GERD (gastroesophageal reflux disease)    Headache(784.0)    Hyperlipidemia    Hypertension    Iron deficiency 01/21/2017   Microcytic anemia 01/26/2010   02/2012: Nl CBC ex H&H-10.7/34.8, MCV-69    NECK PAIN, CHRONIC 10/21/2008   +chronic back pain     Obesity    Obstructive sleep apnea    Osteoarthritis    Left knee; right shoulder; chronic neck and back pain   Pruritus    PVD (peripheral vascular disease) ([facility]) 01/28/2014   Seizures (HCC)    Shoulder pain, right 04/14/2015   Tremor    This started months ago after her seizure progressing to very poor hand writing   Urinary incontinence    UTI (urinary tract infection) 01/18/2013    Past Surgical History:  Procedure Laterality Date   ABDOMINAL HYSTERECTOMY     BREAST EXCISIONAL BIOPSY     Left; cyst   CATARACT EXTRACTION Right    12/2017   CATARACT EXTRACTION W/ INTRAOCULAR LENS IMPLANT Left 09/07/2013   CHOLECYSTECTOMY     COLONOSCOPY     COLONOSCOPY N/A 07/20/2015   Procedure: COLONOSCOPY;  Surgeon: Rogene Houston, MD;  Location: [facility];  Service: Endoscopy;  Laterality: N/A;  930   EYE SURGERY Left 09/07/2013   cataract    Social History   Socioeconomic History   Marital status: Married    Spouse name: saunders   Number of children: 5   Years of education: 12    Highest education level: Not on file  Occupational History   Occupation: Disabled     Employer: RETIRED  Tobacco Use   Smoking status: Never   Smokeless tobacco: Never  Vaping Use   Vaping Use: Never used  Substance and Sexual Activity   Alcohol use: No    Alcohol/week: 0.0 standard drinks of alcohol   Drug use: No   Sexual activity: Yes    Birth control/protection: Surgical  Other Topics Concern   Not on file  Social History Narrative   07/27/20 Patient lives at home with her husband [name] and dgtr- [name].  Patient is retired.    Right handed.    Five Children.    Caffeine- 2 daily   Social Determinants of Health   Financial Resource Strain: Low Risk  (04/18/2022)   Overall Financial Resource Strain (CARDIA)    Difficulty of Paying Living Expenses: Not hard at all  Food Insecurity: No Food Insecurity (04/18/2022)   Hunger Vital Sign    Worried About  Running Out of Food in the Last Year: Never true    [place] in the Last Year: Never true  Transportation Needs: No Transportation Needs (04/18/2022)   PRAPARE - Hydrologist (Medical): No    Lack of Transportation (Non-Medical): No  Physical Activity: Inactive (04/18/2022)   Exercise Vital Sign    Days of Exercise per Week: 0 days    Minutes of Exercise per Session: 0 min  Stress: No Stress Concern Present (04/18/2022)   [place]    Feeling of Stress : Not at all  Social Connections: Moderately Integrated (04/18/2022)   Social Connection and Isolation Panel [NHANES]    Frequency of Communication with Friends and Family: More than three times a week    Frequency of Social Gatherings with Friends and Family: More than three times a week    Attends Religious Services: More than 4 times per year    Active Member of [employer] or Organizations: No    Attends Archivist Meetings: Never    Marital Status: Married     Family History  Problem Relation Age of Onset   Lung cancer Mother 52   Kidney disease Father    Diabetes Sister    Keloids Brother    ADD / ADHD Grandchild    Bipolar disorder Grandchild    Bipolar disorder Daughter    Seizures Daughter    Heart disease Daughter    Kidney disease Son    Neuropathy Son    Kidney disease Son    Edema Daughter    Breast cancer Daughter 36   Allergies Daughter    Alcohol abuse Neg Hx    Drug abuse Neg Hx     Outpatient Encounter Medications as of 05/10/2022  Medication Sig   Accu-Chek FastClix Lancets MISC 1 each by Does not apply route 4 (four) times daily. Use to monitor glucose levels 4 times daily; E11.65   Alcohol Swabs (B-D SINGLE USE SWABS REGULAR) PADS 1 each by Does not apply route 4 (four) times daily. Use to prep site for glucose monitoring 4 times daily; E11.65   amLODipine (NORVASC) 5 MG tablet TAKE 1 TABLET BY MOUTH EVERY DAY   aspirin 81 MG tablet Take 81 mg by mouth daily.   atorvastatin (LIPITOR) 40 MG tablet TAKE 1 TABLET BY MOUTH EVERY DAY   benazepril (LOTENSIN) 20 MG tablet TAKE 1 TABLET BY MOUTH EVERY DAY   Blood Glucose Monitoring Suppl (ACCU-CHEK GUIDE ME) w/Device KIT 1 each by Does not apply route 4 (four) times daily. Use to monitor glucose levels 4 times daily; E11.65   buPROPion (WELLBUTRIN XL) 150 MG 24 hr tablet Take 1 tablet (150 mg total) by mouth every morning.   Cholecalciferol 125 MCG (5000 UT) TABS Take 5,000 Units by mouth daily.   dicyclomine (BENTYL) 10 MG capsule Take 1 capsule (10 mg total) by mouth every 12 (twelve) hours as needed for spasms.   diphenhydrAMINE (BENADRYL) 25 MG tablet Take 25 mg by mouth at bedtime.   donepezil (ARICEPT) 5 MG tablet TAKE 1 TABLET BY MOUTH AT BEDTIME   gabapentin (NEURONTIN) 400 MG capsule TAKE ONE CAPSULE BY MOUTH EVERY MORNING and TAKE ONE CAPSULE AT NOON and TAKE TWO CAPSULES AT BEDTIME   gentamicin cream (GARAMYCIN) 0.1 % Apply 1 Application topically 2 (two) times  daily. (Patient not taking: Reported on 05/10/2022)   glucose blood (ACCU-CHEK GUIDE) test strip  USE TO TEST BLOOD SUGAR FOUR TIMES DAILY E11.65   glucose blood test strip 1 each by Other route in the morning, at noon, in the evening, and at bedtime. Use as instructed qid. E11.65   insulin degludec (TRESIBA FLEXTOUCH) 200 UNIT/ML FlexTouch Pen Inject 12 Units into the skin at bedtime.   Insulin Pen Needle (PEN NEEDLES) 31G X 5 MM MISC Use to inject insulin once daily   Insulin Syringe-Needle U-100 (INSULIN SYRINGE 1CC/31GX5/16") 31G X 5/16" 1 ML MISC 1 each by Does not apply route daily. Use to inject insulin daily; E11.65   Multiple Vitamin (MULTIVITAMIN) capsule Take 1 capsule by mouth daily.   omeprazole (PRILOSEC) 20 MG capsule TAKE ONE CAPSULE BY MOUTH EVERY DAY   sertraline (ZOLOFT) 100 MG tablet Take 1 tablet (100 mg total) by mouth daily.   torsemide (DEMADEX) 20 MG tablet TAKE 2 TABLETS BY MOUTH EVERY DAY and additionally TAKE 1 TABLET EVERY OTHER DAY   UNABLE TO FIND Walker x 1  DX unsteady gait, osteoarthritis of left knee   UNABLE TO FIND Elevated [place] Seat x 1 DX: unsteady gait, back pain, osteoarthritis of left knee   UNABLE TO FIND Standing upright walker x 1  DX M54.40, M19.90   UNABLE TO FIND Incontinence pads and supplies   UNABLE TO FIND Rollator Walker   UNABLE TO FIND XL Tranquility Briefs for incontinence.   [DISCONTINUED] insulin degludec (TRESIBA FLEXTOUCH) 200 UNIT/ML FlexTouch Pen Inject 12 Units into the skin at bedtime.   [DISCONTINUED] tiZANidine (ZANAFLEX) 2 MG tablet Take 1 tablet (2 mg total) by mouth every 6 (six) hours as needed for muscle spasms.   [DISCONTINUED] Vibegron (GEMTESA) 75 MG TABS Take 75 mg by mouth daily.   No facility-administered encounter medications on file as of 05/10/2022.    ALLERGIES: Allergies  Allergen Reactions   Penicillins Shortness Of Breath, Itching and Rash   Prednisone Shortness Of Breath, Itching and Rash   Propoxyphene  N-Acetaminophen Itching and Nausea And Vomiting   Sulfa Antibiotics Itching and Nausea And Vomiting    VACCINATION STATUS: Immunization History  Administered Date(s) Administered   Fluad Quad(high Dose 65+) 05/20/2019   H1N1 07/15/2008   Influenza Split 06/17/2012   Influenza Whole 07/03/2007, 06/17/2009, 06/07/2011   Influenza, High Dose Seasonal PF 07/17/2018   Influenza,inj,Quad PF,6+ Mos 07/13/2013, 09/06/2014, 08/10/2015, 05/23/2016, 05/22/2017   Pneumococcal Conjugate-13 05/03/2014   Pneumococcal Polysaccharide-23 09/08/2010   Td 09/08/2010   Tdap 07/17/2018   Unspecified SARS-COV-2 Vaccination 09/25/2019, 10/26/2019    Diabetes She presents for her follow-up diabetic visit. She has type 2 diabetes mellitus. Onset time: poor historian- was diagnosed in her early 43s. Her disease course has been improving. There are no hypoglycemic associated symptoms. Pertinent negatives for diabetes include no blurred vision, no fatigue, no polydipsia and no polyuria. There are no hypoglycemic complications. Symptoms are improving. Diabetic complications include a CVA, nephropathy, peripheral neuropathy and PVD. [facility] daughter reports CVA in past) Risk factors for coronary artery disease include diabetes mellitus, dyslipidemia, family history, hypertension, obesity, post-menopausal and sedentary lifestyle. Current diabetic treatment includes insulin injections. She is compliant with treatment most of the time. Her weight is fluctuating minimally. She is following a generally healthy diet. When asked about meal planning, she reported none. She has not had a previous visit with a dietitian. She rarely participates in exercise. Her home blood glucose trend is decreasing steadily. Her breakfast blood glucose range is generally 110-130 mg/dl. Her overall blood glucose range is 110-130  mg/dl. (She presents today, accompanied by her daughter, with her meter and logs showing mostly at goal glycemic profile.  She  does have some nocturnal hypoglycemia at times, likely due to food choices or timing of meals.  Analysis of her meter shows 7-day average of 115, 14-day average of 121, 30-day average of 123, 90-day average of 124.  Her daughter mentions her mother has a huge craving for sweets.) An ACE inhibitor/angiotensin II receptor blocker is being taken. She sees a podiatrist.Eye exam is current.  Hypertension This is a chronic problem. The current episode started more than 1 year ago. The problem has been resolved since onset. The problem is controlled. Pertinent negatives include no blurred vision. There are no associated agents to hypertension. Risk factors for coronary artery disease include diabetes mellitus, dyslipidemia, family history, obesity, post-menopausal state and sedentary lifestyle. Past treatments include calcium channel blockers and ACE inhibitors. The current treatment provides moderate improvement. Compliance problems include exercise and diet.  Hypertensive end-organ damage includes kidney disease, CVA and PVD. Identifiable causes of hypertension include chronic renal disease and sleep apnea.  Hyperlipidemia This is a chronic problem. The current episode started more than 1 year ago. The problem is controlled. Recent lipid tests were reviewed and are normal. Exacerbating diseases include chronic renal disease, diabetes and obesity. There are no known factors aggravating her hyperlipidemia. Current antihyperlipidemic treatment includes statins. The current treatment provides moderate improvement of lipids. Compliance problems include adherence to diet and adherence to exercise.  Risk factors for coronary artery disease include diabetes mellitus, dyslipidemia, family history, hypertension, obesity, a sedentary lifestyle and post-menopausal.     Review of systems  Constitutional: + Minimally fluctuating body weight, Body mass index is 38.62 kg/m., + fatigue, no subjective hyperthermia, no  subjective hypothermia Eyes: no blurry vision, no xerophthalmia ENT: no sore throat, no nodules palpated in throat, no dysphagia/odynophagia, no hoarseness Cardiovascular: no chest pain, no shortness of breath, no palpitations, + intermittent leg swelling Respiratory: no cough, no shortness of breath Gastrointestinal: no nausea/vomiting/diarrhea Genitourinary: + polyuria and incontinence Musculoskeletal: Hx chronic pain; walks with walker Skin: no rashes, no hyperemia Neurological: no tremors, no numbness, no tingling, no dizziness Psychiatric: no depression, no anxiety  Objective:     BP 126/69   Pulse 80   Ht 5' 8"  (1.727 m)   Wt 254 lb (115.2 kg)   BMI 38.62 kg/m   Wt Readings from Last 3 Encounters:  05/10/22 254 lb (115.2 kg)  05/02/22 254 lb (115.2 kg)  04/17/22 258 lb 1.9 oz (117.1 kg)     BP Readings from Last 3 Encounters:  05/10/22 126/69  05/02/22 (!) 144/74  04/17/22 124/61     Physical Exam- Limited  Constitutional:  Body mass index is 39.08 kg/m. , not in acute distress, normal state of mind Eyes:  EOMI, no exophthalmos Neck: Supple Cardiovascular: irregular rhythm, no murmurs, rubs, or gallops, + nonpitting edema to BLE Respiratory: Adequate breathing efforts, no crackles, rales, rhonchi, or wheezing Musculoskeletal: no gross deformities, strength intact in all four extremities, no gross restriction of joint movements, walks with walker Skin:  no rashes, no hyperemia Neurological: no tremor with outstretched hands    CMP ( most recent) CMP     Component Value Date/Time   NA 140 05/01/2022 1502   NA 146 (H) 10/24/2021 1218   K 4.1 05/01/2022 1502   CL 103 05/01/2022 1502   CO2 29 05/01/2022 1502   GLUCOSE 109 (H) 05/01/2022 1502   BUN  49 (H) 05/01/2022 1502   BUN 42 (H) 10/24/2021 1218   CREATININE 1.71 (H) 05/01/2022 1502   CREATININE 2.02 (H) 03/22/2022 1534   CALCIUM 9.1 05/01/2022 1502   PROT 7.4 03/22/2022 1534   PROT 7.0 10/24/2021  1218   ALBUMIN 3.8 05/01/2022 1502   ALBUMIN 4.3 10/24/2021 1218   AST 17 03/22/2022 1534   ALT 20 03/22/2022 1534   ALT 16 03/22/2022 1534   ALKPHOS 111 10/24/2021 1218   BILITOT 0.4 03/22/2022 1534   BILITOT 0.2 10/24/2021 1218   GFRNONAA 30 (L) 05/01/2022 1502   GFRNONAA 40 (L) 12/01/2019 1137   GFRAA 55 (L) 06/03/2020 0210   GFRAA 46 (L) 12/01/2019 1137     Diabetic Labs (most recent): Lab Results  Component Value Date   HGBA1C 6.9 05/10/2022   HGBA1C 7.8 (A) 10/24/2021   HGBA1C 10.0 (H) 04/07/2021   MICROALBUR 33.9 11/26/2018   MICROALBUR 20.9 (H) 10/15/2016   MICROALBUR 0.8 03/14/2015     Lipid Panel ( most recent) Lipid Panel     Component Value Date/Time   CHOL 153 10/24/2021 1218   TRIG 136 10/24/2021 1218   HDL 60 10/24/2021 1218   CHOLHDL 2.6 10/24/2021 1218   CHOLHDL 2.6 12/01/2019 1137   VLDL 16 05/14/2017 1039   LDLCALC 70 10/24/2021 1218   LDLCALC 72 12/01/2019 1137   LABVLDL 23 10/24/2021 1218      Lab Results  Component Value Date   TSH 2.750 10/24/2021   TSH 1.129 05/27/2020   TSH 2.24 12/01/2019   TSH 1.12 11/26/2018   TSH 2.01 05/14/2017   TSH 2.13 02/21/2016   TSH 0.989 08/18/2014   TSH 0.520 01/23/2012   TSH 1.315 09/08/2010   TSH 1.674 05/16/2010           Assessment & Plan:   1) Uncontrolled Type 2 Diabetes with CKD stage 3b-  She presents today, accompanied by her daughter, with her meter and logs showing mostly at goal glycemic profile.  She does have some nocturnal hypoglycemia at times, likely due to food choices or timing of meals.  Analysis of her meter shows 7-day average of 115, 14-day average of 121, 30-day average of 123, 90-day average of 124.  Her daughter mentions her mother has a huge craving for sweets.  - Laura Atkins has currently uncontrolled symptomatic type 2 DM since 81 years of age.   -Recent labs reviewed.  She has seen Dr. Theador Hawthorne in the past for CKD.  - I had a long discussion with her about the  progressive nature of diabetes and the pathology behind its complications.  -her diabetes is complicated by CKD, CVA, neuropathy, PVD and she remains at a high risk for more acute and chronic complications which include CAD, CVA, CKD, retinopathy, and neuropathy. These are all discussed in detail with her.  - Nutritional counseling repeated at each appointment due to patients tendency to fall back in to old habits.  - The patient admits there is a room for improvement in their diet and drink choices. -  Suggestion is made for the patient to avoid simple carbohydrates from their diet including Cakes, Sweet Desserts / Pastries, Ice Cream, Soda (diet and regular), Sweet Tea, Candies, Chips, Cookies, Sweet Pastries, Store Bought Juices, Alcohol in Excess of 1-2 drinks a day, Artificial Sweeteners, Coffee Creamer, and "Sugar-free" Products. This will help patient to have stable blood glucose profile and potentially avoid unintended weight gain.   - I encouraged the patient  to switch to unprocessed or minimally processed complex starch and increased protein intake (animal or plant source), fruits, and vegetables.   - Patient is advised to stick to a routine mealtimes to eat 3 meals a day and avoid unnecessary snacks (to snack only to correct hypoglycemia).  - I have approached her with the following individualized plan to manage her diabetes and patient agrees:   -Given her age and comorbidities, avoiding hypoglycemia will be the number 1 priority in her diabetes management.  An A1c of 7-7.5 will be acceptable for her.  Adjustments need to be made slowly in her case to avoid inadvertent hypoglycemia.  -She has done well with switching to ultra-long acting insulin once daily.  She is advised to continue her Tresiba 12 units SQ nightly.  She will do most benefit to avoid processed foods and concentrated sweets.  -she is encouraged to continue monitoring blood glucose twice daily, before breakfast and  before bed, and to call the clinic if she has readings less than 80 or above 300 for 3 tests in a row.    - she is warned not to take insulin without proper monitoring per orders. - Adjustment parameters are given to her for hypo and hyperglycemia in writing.  - she is not a candidate for Metformin or SGLT2i due to concurrent renal insufficiency.  - she will be considered for incretin therapy as appropriate next visit.  - Specific targets for  A1c; LDL, HDL, and Triglycerides were discussed with the patient.  2) Blood Pressure /Hypertension:  her blood pressure is controlled to target.   she is advised to continue her current medications including Norvasc 5 mg p.o. daily with breakfast, Benazepril 20 mg po daily and Torsemide 40 mg and 60 mg daily on alternating days.  3) Lipids/Hyperlipidemia:    Review of her recent lipid panel from 10/24/21 showed controlled LDL at 70 .  she is advised to continue Lipitor 40 mg daily at bedtime.  Side effects and precautions discussed with her.  4)  Weight/Diet:  her Body mass index is 38.62 kg/m.  -  clearly complicating her diabetes care.   she is a candidate for weight loss. I discussed with her the fact that loss of 5 - 10% of her  current body weight will have the most impact on her diabetes management.  Exercise, and detailed carbohydrates information provided  -  detailed on discharge instructions.  5) Chronic Care/Health Maintenance: -she is on ACEI/ARB and Statin medications and is encouraged to initiate and continue to follow up with Ophthalmology, Dentist, Podiatrist at least yearly or according to recommendations, and advised to stay away from smoking. I have recommended yearly flu vaccine and pneumonia vaccine at least every 5 years; moderate intensity exercise for up to 150 minutes weekly; and sleep for at least 7 hours a day.  - she is advised to maintain close follow up with Fayrene Helper, MD for primary care needs, as well as her  other providers for optimal and coordinated care.      I spent 40 minutes in the care of the patient today including review of labs from [facility], Lipids, Thyroid Function, Hematology (current and previous including abstractions from other facilities); face-to-face time discussing  her blood glucose readings/logs, discussing hypoglycemia and hyperglycemia episodes and symptoms, medications doses, her options of short and long term treatment based on the latest standards of care / guidelines;  discussion about incorporating lifestyle medicine;  and documenting the encounter. Risk reduction  counseling performed per USPSTF guidelines to reduce  obesity and cardiovascular risk factors.     Please refer to Patient Instructions for Blood Glucose Monitoring and Insulin/Medications Dosing Guide"  in media tab for additional information. Please  also refer to " Patient Self Inventory" in the Media  tab for reviewed elements of pertinent patient history.  [name] participated in the discussions, expressed understanding, and voiced agreement with the above plans.  All questions were answered to her satisfaction. she is encouraged to contact clinic should she have any questions or concerns prior to her return visit.     Follow up plan: - Return in about 4 months (around 09/09/2022) for Diabetes F/U with A1c in office, No previsit labs, Bring meter and logs.   Rayetta Pigg, [facility] [address] [place], [place] [address] Phone: [phone] Fax: [phone]  05/10/2022, 2:27 PM

## 2022-05-15 ENCOUNTER — Encounter (HOSPITAL_COMMUNITY)
Admission: RE | Admit: 2022-05-15 | Discharge: 2022-05-15 | Disposition: A | Discharge status: Home / Self Care | Payer: Medicare PPO | Source: Ambulatory Visit | Attending: Nephrology | Admitting: Nephrology

## 2022-05-15 VITALS — BP 145/62 | HR 77 | Temp 98.4°F | Resp 18

## 2022-05-15 DIAGNOSIS — N184 Chronic kidney disease, stage 4 (severe): Secondary | ICD-10-CM | POA: Diagnosis not present

## 2022-05-15 LAB — POCT HEMOGLOBIN-HEMACUE: Hemoglobin: 8 g/dL — ABNORMAL LOW (ref 12.0–15.0)

## 2022-05-15 MED ORDER — EPOETIN ALFA-EPBX 10000 UNIT/ML IJ SOLN
INTRAMUSCULAR | Status: AC
  Filled 2022-05-15: qty 1

## 2022-05-15 MED ORDER — EPOETIN ALFA-EPBX 10000 UNIT/ML IJ SOLN
10000.0000 [IU] | Freq: Once | INTRAMUSCULAR | Status: AC
  Administered 2022-05-15: 10000 [IU] via SUBCUTANEOUS

## 2022-05-29 ENCOUNTER — Ambulatory Visit (INDEPENDENT_AMBULATORY_CARE_PROVIDER_SITE_OTHER): Payer: Medicare HMO | Admitting: Gastroenterology

## 2022-05-30 ENCOUNTER — Ambulatory Visit (INDEPENDENT_AMBULATORY_CARE_PROVIDER_SITE_OTHER): Payer: Medicare PPO | Admitting: Nurse Practitioner

## 2022-05-30 ENCOUNTER — Encounter: Payer: Self-pay | Admitting: Nurse Practitioner

## 2022-05-30 ENCOUNTER — Encounter (INDEPENDENT_AMBULATORY_CARE_PROVIDER_SITE_OTHER): Payer: Self-pay | Admitting: Gastroenterology

## 2022-05-30 ENCOUNTER — Ambulatory Visit: Payer: Medicare PPO | Admitting: Nurse Practitioner

## 2022-05-30 VITALS — BP 130/85 | HR 47 | Ht 68.0 in

## 2022-05-30 DIAGNOSIS — R600 Localized edema: Secondary | ICD-10-CM

## 2022-05-30 DIAGNOSIS — H5711 Ocular pain, right eye: Secondary | ICD-10-CM | POA: Diagnosis not present

## 2022-05-30 NOTE — Progress Notes (Signed)
   Laura Atkins     MRN: [id number]      DOB: 1941/08/09   HPI Laura Atkins with past medical history of essential hypertension, PAD, CKD, bilateral leg edema is here for complaints of bilateral leg blisters and redness that started about a week ago.  Patient's daughter reported that the blisters and redness has improved a lot, patient has chronic bilateral lower extremity edema, PVD. she was recently evaluated by podiatry and vascular.  She was advised to be wearing compression compression socks but she has not been compliant with this .  Patient denies pain, fever, chills  Patient complained of left eye pain that started this morning, she denies changes in her vision, HA, redness, itching drainage, sneezing.       ROS Denies recent fever or chills. Denies sinus pressure, nasal congestion, ear pain or sore throat. Denies chest congestion, productive cough or wheezing. Denies chest pains, palpitations and leg swelling Denies abdominal pain, nausea, vomiting,diarrhea or constipation.   Denies dysuria, frequency, hesitancy or incontinence. Denies headaches, seizures, numbness, or tingling. Denies depression, anxiety or insomnia.    PE  BP 130/85 (BP Location: Right Arm, Patient Position: Sitting, Cuff Size: Large)   Pulse (!) 47   Ht 5\' 8"  (1.727 m)   SpO2 94%   BMI 38.62 kg/m   Patient alert and oriented and in no cardiopulmonary distress.  HEENT: No facial asymmetry, EOMI,     Neck supple . No redness or drainage noted.  Chest: Clear to auscultation bilaterally.  CVS: S1, S2 no murmurs, no S3.Regular rate.  ABD: Soft non tender.   Ext:has bilateral +1 pitting  edema, both legs and feet are warm to touch , has one blister on each leg with clear drainage, bilateral legs appear mildly red, both feet appears dark in color. No ulcer noted    Psych: Good eye contact, normal affect. Memory intact not anxious or depressed appearing.     Assessment & Plan  Bilateral leg  edema I have no concern for cellulitis  Blisters and redness resolving per patient's daughter   Patient encouraged to  keep her legs elevated when sitting, wear compression socks daily to help decrease  leg swelling.call the office if your legs becomes red or blisters gets worse or she  develop fever.  Takes torsemide 20mg  daily   Acute right eye pain No red flag on examination today, no redness or drainage noted  Use of warm compress and OTC tylenol encouraged

## 2022-05-30 NOTE — Assessment & Plan Note (Signed)
I have no concern for cellulitis  Blisters and redness resolving per patient's daughter   Patient encouraged to  keep her legs elevated when sitting, wear compression socks daily to help decrease  leg swelling.call the office if your legs becomes red or blisters gets worse or she  develop fever.  Takes torsemide 20mg  daily

## 2022-05-30 NOTE — Patient Instructions (Signed)
Please keep your legs elevated when sitting, wear compression socks daily to help decrease your leg swelling. Please call the office if your legs becomes red or blisters gets worse or you develop fever.   You may use warm compress for your eye to help relieve the pain you are having, take over the counter tylenol as needed.     Thanks for choosing [facility], we consider it a privelige to serve you.

## 2022-05-30 NOTE — Assessment & Plan Note (Signed)
No red flag on examination today, no redness or drainage noted  Use of warm compress and OTC tylenol encouraged

## 2022-05-31 ENCOUNTER — Encounter (HOSPITAL_COMMUNITY)
Admission: RE | Admit: 2022-05-31 | Discharge: 2022-05-31 | Disposition: A | Discharge status: Home / Self Care | Payer: Medicare PPO | Source: Ambulatory Visit | Attending: Nephrology | Admitting: Nephrology

## 2022-05-31 VITALS — BP 143/54 | HR 81 | Temp 98.4°F | Resp 18

## 2022-05-31 DIAGNOSIS — D631 Anemia in chronic kidney disease: Secondary | ICD-10-CM | POA: Insufficient documentation

## 2022-05-31 DIAGNOSIS — N184 Chronic kidney disease, stage 4 (severe): Secondary | ICD-10-CM | POA: Insufficient documentation

## 2022-05-31 LAB — POCT HEMOGLOBIN-HEMACUE: Hemoglobin: 8.3 g/dL — ABNORMAL LOW (ref 12.0–15.0)

## 2022-05-31 MED ORDER — EPOETIN ALFA-EPBX 10000 UNIT/ML IJ SOLN
10000.0000 [IU] | Freq: Once | INTRAMUSCULAR | Status: AC
  Administered 2022-05-31: 10000 [IU] via SUBCUTANEOUS

## 2022-05-31 MED ORDER — EPOETIN ALFA 10000 UNIT/ML IJ SOLN: 10000.0000 [IU] | Freq: Once | INTRAMUSCULAR | Status: DC

## 2022-05-31 MED ORDER — EPOETIN ALFA-EPBX 10000 UNIT/ML IJ SOLN
10000.0000 [IU] | Freq: Once | INTRAMUSCULAR | Status: DC
Start: 2022-05-31 — End: 2022-05-31

## 2022-06-05 ENCOUNTER — Telehealth: Payer: Self-pay | Admitting: Family Medicine

## 2022-06-05 NOTE — Telephone Encounter (Signed)
Pt called stating she is still having the same issues with her bowels. States it is always liquid. Wants to know what to do?

## 2022-06-05 NOTE — Telephone Encounter (Signed)
Patient aware.

## 2022-06-06 NOTE — Progress Notes (Signed)
duplicate

## 2022-06-12 DIAGNOSIS — E11649 Type 2 diabetes mellitus with hypoglycemia without coma: Secondary | ICD-10-CM

## 2022-06-12 DIAGNOSIS — Z794 Long term (current) use of insulin: Secondary | ICD-10-CM | POA: Insufficient documentation

## 2022-06-12 DIAGNOSIS — E1122 Type 2 diabetes mellitus with diabetic chronic kidney disease: Secondary | ICD-10-CM | POA: Insufficient documentation

## 2022-06-12 HISTORY — DX: Type 2 diabetes mellitus with hypoglycemia without coma: E11.649

## 2022-06-12 HISTORY — DX: Type 2 diabetes mellitus with hypoglycemia without coma: Z79.4

## 2022-06-13 DIAGNOSIS — E875 Hyperkalemia: Secondary | ICD-10-CM | POA: Diagnosis not present

## 2022-06-13 DIAGNOSIS — N189 Chronic kidney disease, unspecified: Secondary | ICD-10-CM | POA: Diagnosis not present

## 2022-06-13 DIAGNOSIS — D638 Anemia in other chronic diseases classified elsewhere: Secondary | ICD-10-CM | POA: Diagnosis not present

## 2022-06-13 DIAGNOSIS — R600 Localized edema: Secondary | ICD-10-CM | POA: Diagnosis not present

## 2022-06-13 DIAGNOSIS — E1122 Type 2 diabetes mellitus with diabetic chronic kidney disease: Secondary | ICD-10-CM | POA: Diagnosis not present

## 2022-06-13 DIAGNOSIS — R809 Proteinuria, unspecified: Secondary | ICD-10-CM | POA: Diagnosis not present

## 2022-06-13 DIAGNOSIS — I129 Hypertensive chronic kidney disease with stage 1 through stage 4 chronic kidney disease, or unspecified chronic kidney disease: Secondary | ICD-10-CM | POA: Diagnosis not present

## 2022-06-14 ENCOUNTER — Ambulatory Visit: Payer: Medicare PPO | Admitting: Internal Medicine

## 2022-06-14 ENCOUNTER — Encounter (HOSPITAL_COMMUNITY)
Admission: RE | Admit: 2022-06-14 | Discharge: 2022-06-14 | Disposition: A | Discharge status: Home / Self Care | Payer: Medicare PPO | Source: Ambulatory Visit | Attending: Nephrology | Admitting: Nephrology

## 2022-06-14 ENCOUNTER — Encounter (HOSPITAL_COMMUNITY): Payer: Self-pay

## 2022-06-14 VITALS — BP 151/93 | HR 72 | Temp 98.3°F | Resp 18 | Ht 68.0 in | Wt 258.1 lb

## 2022-06-14 DIAGNOSIS — N184 Chronic kidney disease, stage 4 (severe): Secondary | ICD-10-CM | POA: Diagnosis not present

## 2022-06-14 LAB — POCT HEMOGLOBIN-HEMACUE: Hemoglobin: 8.1 g/dL — ABNORMAL LOW (ref 12.0–15.0)

## 2022-06-14 MED ORDER — EPOETIN ALFA-EPBX 10000 UNIT/ML IJ SOLN: 10000.0000 [IU] | Freq: Once | INTRAMUSCULAR | Status: DC

## 2022-06-14 MED ORDER — EPOETIN ALFA-EPBX 10000 UNIT/ML IJ SOLN
10000.0000 [IU] | Freq: Once | INTRAMUSCULAR | Status: AC
  Administered 2022-06-14: 10000 [IU] via SUBCUTANEOUS

## 2022-06-14 MED ORDER — EPOETIN ALFA-EPBX 10000 UNIT/ML IJ SOLN
INTRAMUSCULAR | Status: AC
  Filled 2022-06-14: qty 1

## 2022-06-14 MED ORDER — EPOETIN ALFA 10000 UNIT/ML IJ SOLN: 10000.0000 [IU] | Freq: Once | INTRAMUSCULAR | Status: DC

## 2022-06-21 ENCOUNTER — Telehealth: Payer: Self-pay

## 2022-06-21 NOTE — Telephone Encounter (Signed)
Dan with [facility]  phone # [phone]. Faxed diabetic libre monitor for patient needs review and signed and faxed back [phone]. looking to close this order by tomorrow. Faxed back end of today 09.28.2023.

## 2022-06-22 ENCOUNTER — Encounter: Payer: Self-pay | Admitting: Family Medicine

## 2022-06-22 ENCOUNTER — Ambulatory Visit (INDEPENDENT_AMBULATORY_CARE_PROVIDER_SITE_OTHER): Payer: Medicare PPO | Admitting: Family Medicine

## 2022-06-22 VITALS — BP 142/66 | HR 75 | Ht 68.0 in

## 2022-06-22 DIAGNOSIS — E509 Vitamin A deficiency, unspecified: Secondary | ICD-10-CM | POA: Diagnosis not present

## 2022-06-22 DIAGNOSIS — F419 Anxiety disorder, unspecified: Secondary | ICD-10-CM | POA: Diagnosis not present

## 2022-06-22 DIAGNOSIS — M5440 Lumbago with sciatica, unspecified side: Secondary | ICD-10-CM | POA: Diagnosis not present

## 2022-06-22 DIAGNOSIS — E1122 Type 2 diabetes mellitus with diabetic chronic kidney disease: Secondary | ICD-10-CM | POA: Diagnosis not present

## 2022-06-22 DIAGNOSIS — I10 Essential (primary) hypertension: Secondary | ICD-10-CM

## 2022-06-22 DIAGNOSIS — Z23 Encounter for immunization: Secondary | ICD-10-CM | POA: Diagnosis not present

## 2022-06-22 DIAGNOSIS — N1832 Chronic kidney disease, stage 3b: Secondary | ICD-10-CM

## 2022-06-22 DIAGNOSIS — Z794 Long term (current) use of insulin: Secondary | ICD-10-CM

## 2022-06-22 DIAGNOSIS — I739 Peripheral vascular disease, unspecified: Secondary | ICD-10-CM

## 2022-06-22 DIAGNOSIS — E1169 Type 2 diabetes mellitus with other specified complication: Secondary | ICD-10-CM

## 2022-06-22 DIAGNOSIS — E785 Hyperlipidemia, unspecified: Secondary | ICD-10-CM

## 2022-06-22 DIAGNOSIS — F32A Depression, unspecified: Secondary | ICD-10-CM

## 2022-06-22 MED ORDER — UNABLE TO FIND: 11 refills | Status: DC

## 2022-06-22 NOTE — Patient Instructions (Addendum)
Annual exam early February, call if you need me before  Flu vaccine today  Please where compression knee high stockings to help with leg swelling and reduce chance of blistering  Fasting lipid, cmp and eGFR , TSH and vit D 1 week before Feb appt  Careful not to fall  HAPPY 81 on October 2!   Nurse please order incontince supplies, [employer]  or [place] [employer], the specific brand she has with her

## 2022-06-22 NOTE — Progress Notes (Unsigned)
Laura Atkins     MRN: [id number]      DOB: Aug 08, 1941   HPI Laura Atkins is here for follow up and re-evaluation of chronic medical conditions, medication management and review of any available recent lab and radiology data.  Preventive health is updated, specifically  Cancer screening and Immunization.   Questions or concerns regarding consultations or procedures which the PT has had in the interim are  addressed. The PT denies any adverse reactions to current medications since the last visit.    ROS Denies recent fever or chills. Denies sinus pressure, nasal congestion, ear pain or sore throat. Denies chest congestion, productive cough or wheezing. Denies chest pains, palpitations and leg swelling Chronic  abdominal pain, denies nausea, vomiting,diarrhea or constipation.   Denies dysuria, frequency, hesitancy has , chronic incontinence. Chronic  joint pain, swelling and limitation in mobility. Denies headaches, seizures, numbness, or tingling. Denies  uncontrolled depression, anxiety or insomnia. Denies skin break down or rash.   PE  BP (!) 142/66   Pulse 75   Ht 5\' 8"  (1.727 m)   SpO2 95%   BMI 39.25 kg/m   Patient alert and oriented and in no cardiopulmonary distress.  HEENT: No facial asymmetry, EOMI,     Neck decrease rom .  Chest: Clear to auscultation bilaterally.  CVS: S1, S2 no murmurs, no S3.Regular rate.  ABD: Soft non tender.   Ext: No edema  MS: decreased  ROM spine, shoulders, hips and knees.  Skin: Intact, no ulcerations or rash noted.  Psych: Good eye contact, normal affect. Memory intact not anxious or depressed appearing.  CNS: CN 2-12 intact, power,  normal throughout.no focal deficits noted.   Assessment & Plan  Essential hypertension Adequate control no med change DASH diet and commitment to daily physical activity for a minimum of 30 minutes discussed and encouraged, as a part of hypertension management. The importance of attaining a  healthy weight is also discussed.     06/22/2022    4:31 PM 06/22/2022    3:55 PM 06/22/2022    3:48 PM 06/14/2022    1:50 PM 05/31/2022    2:42 PM 05/30/2022   12:17 PM 05/15/2022    2:45 PM  BP/Weight  Systolic BP 142 152 154 151 143 130 145  Diastolic BP 66 62 60 93 54 85 62  Wt. (Lbs)    258.12     BMI    39.25 kg/m2          Morbid obesity (HCC)  Patient re-educated about  the importance of commitment to a  minimum of 150 minutes of exercise per week as able.  The importance of healthy food choices with portion control discussed, as well as eating regularly and within a 12 hour window most days. The need to choose "clean , green" food 50 to 75% of the time is discussed, as well as to make water the primary drink and set a goal of 64 ounces water daily.       06/22/2022    3:48 PM 06/14/2022    1:50 PM 05/30/2022   12:17 PM  Weight /BMI  Weight  258 lb 1.9 oz   Height 5\' 8"  (1.727 m) 5\' 8"  (1.727 m) 5\' 8"  (1.727 m)  BMI 39.25 kg/m2 39.25 kg/m2 38.62 kg/m2      Type 2 diabetes mellitus with diabetic chronic kidney disease (HCC) Managed by [name] is reminded of the importance of commitment to daily physical activity  for 30 minutes or more, as able and the need to limit carbohydrate intake to 30 to 60 grams per meal to help with blood sugar control.   The need to take medication as prescribed, test blood sugar as directed, and to call between visits if there is a concern that blood sugar is uncontrolled is also discussed.   Laura Atkins is reminded of the importance of daily foot exam, annual eye examination, and good blood sugar, blood pressure and cholesterol control.     Latest Ref Rng & Units 05/10/2022    2:10 PM 05/01/2022    3:02 PM 03/22/2022    3:34 PM 10/24/2021    1:13 PM 10/24/2021   12:18 PM  Diabetic Labs  HbA1c 4.0 - 5.6 % 6.9    7.8    Chol 100 - 199 mg/dL     161   HDL >09 mg/dL     60   Calc LDL 0 - 99 mg/dL     70   Triglycerides 0 - 149 mg/dL      604   Creatinine 5.40 - 1.00 mg/dL  9.81  1.91   4.78       06/22/2022    4:31 PM 06/22/2022    3:55 PM 06/22/2022    3:48 PM 06/14/2022    1:50 PM 05/31/2022    2:42 PM 05/30/2022   12:17 PM 05/15/2022    2:45 PM  BP/Weight  Systolic BP 142 152 154 151 143 130 145  Diastolic BP 66 62 60 93 54 85 62  Wt. (Lbs)    258.12     BMI    39.25 kg/m2         Latest Ref Rng & Units 12/01/2021    9:40 AM 02/15/2021   12:00 AM  Foot/eye exam completion dates  Eye Exam No Retinopathy  Retinopathy      Foot Form Completion  Done      This result is from an external source.        Anxiety and depression Stable and controlled , followed by Psych  PAD (peripheral artery disease) (HCC) Needs ro wear compression hose to reduce blistering and edema  Back pain of lumbar region with sciatica High fall risk home safety reviewed

## 2022-06-25 ENCOUNTER — Encounter: Payer: Self-pay | Admitting: Family Medicine

## 2022-06-25 NOTE — Assessment & Plan Note (Signed)
Adequate control no med change DASH diet and commitment to daily physical activity for a minimum of 30 minutes discussed and encouraged, as a part of hypertension management. The importance of attaining a healthy weight is also discussed.     06/22/2022    4:31 PM 06/22/2022    3:55 PM 06/22/2022    3:48 PM 06/14/2022    1:50 PM 05/31/2022    2:42 PM 05/30/2022   12:17 PM 05/15/2022    2:45 PM  BP/Weight  Systolic BP 076 151 834 373 578 978 478  Diastolic BP 66 62 60 93 54 85 62  Wt. (Lbs)    258.12     BMI    39.25 kg/m2

## 2022-06-25 NOTE — Assessment & Plan Note (Signed)
Managed by [name] is reminded of the importance of commitment to daily physical activity for 30 minutes or more, as able and the need to limit carbohydrate intake to 30 to 60 grams per meal to help with blood sugar control.   The need to take medication as prescribed, test blood sugar as directed, and to call between visits if there is a concern that blood sugar is uncontrolled is also discussed.   Laura Atkins is reminded of the importance of daily foot exam, annual eye examination, and good blood sugar, blood pressure and cholesterol control.     Latest Ref Rng & Units 05/10/2022    2:10 PM 05/01/2022    3:02 PM 03/22/2022    3:34 PM 10/24/2021    1:13 PM 10/24/2021   12:18 PM  Diabetic Labs  HbA1c 4.0 - 5.6 % 6.9    7.8    Chol 100 - 199 mg/dL     153   HDL >39 mg/dL     60   Calc LDL 0 - 99 mg/dL     70   Triglycerides 0 - 149 mg/dL     136   Creatinine 0.44 - 1.00 mg/dL  1.71  2.02   1.65       06/22/2022    4:31 PM 06/22/2022    3:55 PM 06/22/2022    3:48 PM 06/14/2022    1:50 PM 05/31/2022    2:42 PM 05/30/2022   12:17 PM 05/15/2022    2:45 PM  BP/Weight  Systolic BP 175 102 585 277 824 235 361  Diastolic BP 66 62 60 93 54 85 62  Wt. (Lbs)    258.12     BMI    39.25 kg/m2         Latest Ref Rng & Units 12/01/2021    9:40 AM 02/15/2021   12:00 AM  Foot/eye exam completion dates  Eye Exam No Retinopathy  Retinopathy      Foot Form Completion  Done      This result is from an external source.

## 2022-06-25 NOTE — Assessment & Plan Note (Signed)
  Patient re-educated about  the importance of commitment to a  minimum of 150 minutes of exercise per week as able.  The importance of healthy food choices with portion control discussed, as well as eating regularly and within a 12 hour window most days. The need to choose "clean , green" food 50 to 75% of the time is discussed, as well as to make water the primary drink and set a goal of 64 ounces water daily.       06/22/2022    3:48 PM 06/14/2022    1:50 PM 05/30/2022   12:17 PM  Weight /BMI  Weight  258 lb 1.9 oz   Height 5\' 8"  (1.727 m) 5\' 8"  (1.727 m) 5\' 8"  (1.727 m)  BMI 39.25 kg/m2 39.25 kg/m2 38.62 kg/m2

## 2022-06-25 NOTE — Assessment & Plan Note (Signed)
Needs ro wear compression hose to reduce blistering and edema

## 2022-06-25 NOTE — Assessment & Plan Note (Signed)
Stable and controlled , followed by Psych

## 2022-06-25 NOTE — Assessment & Plan Note (Signed)
High fall risk home safety reviewed

## 2022-06-26 ENCOUNTER — Other Ambulatory Visit: Payer: Self-pay | Admitting: Family Medicine

## 2022-06-28 ENCOUNTER — Encounter (HOSPITAL_COMMUNITY)
Admission: RE | Admit: 2022-06-28 | Discharge: 2022-06-28 | Disposition: A | Discharge status: Home / Self Care | Payer: Medicare PPO | Source: Ambulatory Visit | Attending: Nephrology | Admitting: Nephrology

## 2022-06-28 ENCOUNTER — Encounter (HOSPITAL_COMMUNITY): Payer: Self-pay

## 2022-06-28 VITALS — BP 147/65 | HR 70 | Temp 98.3°F | Resp 20 | Ht 68.0 in | Wt 257.9 lb

## 2022-06-28 DIAGNOSIS — N184 Chronic kidney disease, stage 4 (severe): Secondary | ICD-10-CM | POA: Insufficient documentation

## 2022-06-28 DIAGNOSIS — D631 Anemia in chronic kidney disease: Secondary | ICD-10-CM | POA: Diagnosis not present

## 2022-06-28 LAB — POCT HEMOGLOBIN-HEMACUE: Hemoglobin: 8.7 g/dL — ABNORMAL LOW (ref 12.0–15.0)

## 2022-06-28 MED ORDER — EPOETIN ALFA-EPBX 10000 UNIT/ML IJ SOLN
INTRAMUSCULAR | Status: AC
  Filled 2022-06-28: qty 1

## 2022-06-28 MED ORDER — EPOETIN ALFA-EPBX 10000 UNIT/ML IJ SOLN
10000.0000 [IU] | Freq: Once | INTRAMUSCULAR | Status: AC
  Administered 2022-06-28: 10000 [IU] via SUBCUTANEOUS

## 2022-07-03 ENCOUNTER — Ambulatory Visit: Payer: Medicare HMO | Admitting: Urology

## 2022-07-03 NOTE — Progress Notes (Deleted)
Assessment: 1. Urge incontinence   2. History of UTI      Plan: She is currently doing well.  Will monitor for the present time. Return to office in 3 months.   Chief Complaint:  No chief complaint on file.   History of Present Illness:  Laura Atkins is a 81 y.o. year old female who is seen for further evaluation of urinary incontinence and UTI's.  She reported symptoms for 10+ years with urinary frequency, urgency, and urge incontinence.  She has used  using maxipads twice daily.  No prior medical therapy for her incontinence symptoms.  She does have a history of UTIs. Urine culture results: 10/21: E. coli 1/22: <10K colonies 2/22: <10K colonies 7/22: E. coli 8/25: E. Coli  She typically has symptoms of increased frequency and dysuria associated with UTIs.  No history of gross hematuria.  She does have chronic kidney disease and is followed by nephrology. Renal ultrasound from 05/19/2021 showed no mass or hydronephrosis involving either kidney and a small amount of layering debris in the bladder.  She was given a trial of Myrbetriq 25 mg daily at her visit on 08/08/2021.  She noted some slight improvement in her symptoms but she continue to have incontinence associated with urgency, both daytime and nighttime.  She was using maxipads 2-3 times per day.  No dysuria or gross hematuria.  Her dose of Myrbetriq was increased to 50 mg daily at her visit on 09/05/2021.  She continued on Myrbetriq 50 mg daily but had not seen a significant improvement in her symptoms.  She continued to have frequency, urgency, and incontinence both daytime and nighttime.  No dysuria or gross hematuria.  She was interested in a purewick device. She continued to use incontinence pads daily. She was given a trial of Vesicare 10 mg daily in 1/23.  At her visit in May 2023, she was no longer taking the Vesicare due to side effects of abdominal pain and difficulty voiding.  She discontinued the  medication after approximately 3 weeks.  She continued to have symptoms of frequency, urgency, and urinary incontinence.  No dysuria or gross hematuria.  She continued to use incontinence pads daily.  She was also having problems with fecal incontinence.  She was undergoing GI evaluation with colonoscopy. She was given a trial of Gemtesa in May 2023.    She returns today for follow-up.  She discontinued the Gemtesa due to concerns about multiple medications and her worsening kidney function.  She is not currently on any medication for her bladder symptoms.  She is actually not having any significant symptoms at the present time.  She is not having problems with urinary incontinence.  No dysuria or gross hematuria.  Portions of the above documentation were copied from a prior visit for review purposes only.  Past Medical History:  Past Medical History:  Diagnosis Date   Alpha thalassemia trait 01/26/2010   02/2012: Nl CBC ex H&H-10.7/34.8, MCV-69    Anemia    Anxiety    Anxiety and depression    Cellulitis 05/09/2017   Depression    Diabetes mellitus    Foot pain, right 04/30/2013   GERD (gastroesophageal reflux disease)    Headache(784.0)    Hyperlipidemia    Hypertension    Iron deficiency 01/21/2017   Microcytic anemia 01/26/2010   02/2012: Nl CBC ex H&H-10.7/34.8, MCV-69    NECK PAIN, CHRONIC 10/21/2008   +chronic back pain     Obesity  Obstructive sleep apnea    Osteoarthritis    Left knee; right shoulder; chronic neck and back pain   Pruritus    PVD (peripheral vascular disease) ([place]) 01/28/2014   Seizures (HCC)    Shoulder pain, right 04/14/2015   Tremor    This started months ago after her seizure progressing to very poor hand writing   Urinary incontinence    UTI (urinary tract infection) 01/18/2013    Past Surgical History:  Past Surgical History:  Procedure Laterality Date   ABDOMINAL HYSTERECTOMY     BREAST EXCISIONAL BIOPSY     Left; cyst   CATARACT  EXTRACTION Right    12/2017   CATARACT EXTRACTION W/ INTRAOCULAR LENS IMPLANT Left 09/07/2013   CHOLECYSTECTOMY     COLONOSCOPY     COLONOSCOPY N/A 07/20/2015   Procedure: COLONOSCOPY;  Surgeon: Rogene Houston, MD;  Location: [facility];  Service: Endoscopy;  Laterality: N/A;  930   EYE SURGERY Left 09/07/2013   cataract    Allergies:  Allergies  Allergen Reactions   Penicillins Shortness Of Breath, Itching and Rash   Prednisone Shortness Of Breath, Itching and Rash   Propoxyphene N-Acetaminophen Itching and Nausea And Vomiting   Sulfa Antibiotics Itching and Nausea And Vomiting    Family History:  Family History  Problem Relation Age of Onset   Lung cancer Mother 60   Kidney disease Father    Diabetes Sister    Keloids Brother    ADD / ADHD Grandchild    Bipolar disorder Grandchild    Bipolar disorder Daughter    Seizures Daughter    Heart disease Daughter    Kidney disease Son    Neuropathy Son    Kidney disease Son    Edema Daughter    Breast cancer Daughter 47   Allergies Daughter    Alcohol abuse Neg Hx    Drug abuse Neg Hx     Social History:  Social History   Tobacco Use   Smoking status: Never   Smokeless tobacco: Never  Vaping Use   Vaping Use: Never used  Substance Use Topics   Alcohol use: No    Alcohol/week: 0.0 standard drinks of alcohol   Drug use: No    ROS: Constitutional:  Negative for fever, chills, weight loss CV: Negative for chest pain, previous MI, hypertension Respiratory:  Negative for shortness of breath, wheezing, sleep apnea, frequent cough GI:  Negative for nausea, vomiting, bloody stool, GERD  Physical exam: There were no vitals taken for this visit. GENERAL APPEARANCE:  Well appearing, well developed, well nourished, NAD HEENT:  Atraumatic, normocephalic, oropharynx clear NECK:  Supple without lymphadenopathy or thyromegaly ABDOMEN:  Soft, non-tender, no masses EXTREMITIES:  Moves all extremities well, without  clubbing, cyanosis, or edema NEUROLOGIC:  Alert and oriented x 3, normal gait, CN II-XII grossly intact MENTAL STATUS:  appropriate BACK:  Non-tender to palpation, No CVAT SKIN:  Warm, dry, and intact   Results: U/A:

## 2022-07-12 ENCOUNTER — Encounter (HOSPITAL_COMMUNITY): Payer: Self-pay

## 2022-07-12 ENCOUNTER — Encounter (HOSPITAL_COMMUNITY)
Admission: RE | Admit: 2022-07-12 | Discharge: 2022-07-12 | Disposition: A | Discharge status: Home / Self Care | Payer: Medicare PPO | Source: Ambulatory Visit | Attending: Nephrology | Admitting: Nephrology

## 2022-07-12 VITALS — BP 128/60 | HR 78 | Temp 98.4°F | Resp 18

## 2022-07-12 DIAGNOSIS — N184 Chronic kidney disease, stage 4 (severe): Secondary | ICD-10-CM

## 2022-07-12 DIAGNOSIS — D631 Anemia in chronic kidney disease: Secondary | ICD-10-CM | POA: Diagnosis not present

## 2022-07-12 MED ORDER — EPOETIN ALFA-EPBX 10000 UNIT/ML IJ SOLN
10000.0000 [IU] | Freq: Once | INTRAMUSCULAR | Status: AC
  Administered 2022-07-12: 10000 [IU] via SUBCUTANEOUS

## 2022-07-12 MED ORDER — EPOETIN ALFA-EPBX 10000 UNIT/ML IJ SOLN
INTRAMUSCULAR | Status: AC
  Filled 2022-07-12: qty 1

## 2022-07-12 MED ORDER — EPOETIN ALFA-EPBX 3000 UNIT/ML IJ SOLN: 3000.0000 [IU] | Freq: Once | INTRAMUSCULAR | Status: DC

## 2022-07-18 LAB — POCT HEMOGLOBIN-HEMACUE: Hemoglobin: 8.4 g/dL — ABNORMAL LOW (ref 12.0–15.0)

## 2022-07-26 ENCOUNTER — Ambulatory Visit (INDEPENDENT_AMBULATORY_CARE_PROVIDER_SITE_OTHER): Payer: Medicare PPO | Admitting: Gastroenterology

## 2022-07-26 ENCOUNTER — Encounter (INDEPENDENT_AMBULATORY_CARE_PROVIDER_SITE_OTHER): Payer: Self-pay | Admitting: Gastroenterology

## 2022-07-26 ENCOUNTER — Encounter (HOSPITAL_COMMUNITY)
Admission: RE | Admit: 2022-07-26 | Discharge: 2022-07-26 | Disposition: A | Discharge status: Home / Self Care | Payer: Medicare PPO | Source: Ambulatory Visit | Attending: Nephrology | Admitting: Nephrology

## 2022-07-26 VITALS — BP 151/60 | HR 63 | Temp 98.0°F | Ht 68.0 in

## 2022-07-26 VITALS — BP 138/58 | HR 73 | Temp 98.2°F | Resp 16

## 2022-07-26 DIAGNOSIS — E1122 Type 2 diabetes mellitus with diabetic chronic kidney disease: Secondary | ICD-10-CM | POA: Diagnosis not present

## 2022-07-26 DIAGNOSIS — N184 Chronic kidney disease, stage 4 (severe): Secondary | ICD-10-CM | POA: Diagnosis present

## 2022-07-26 DIAGNOSIS — D509 Iron deficiency anemia, unspecified: Secondary | ICD-10-CM | POA: Diagnosis not present

## 2022-07-26 DIAGNOSIS — E1136 Type 2 diabetes mellitus with diabetic cataract: Secondary | ICD-10-CM | POA: Diagnosis not present

## 2022-07-26 DIAGNOSIS — I10 Essential (primary) hypertension: Secondary | ICD-10-CM | POA: Diagnosis present

## 2022-07-26 DIAGNOSIS — Z79899 Other long term (current) drug therapy: Secondary | ICD-10-CM | POA: Insufficient documentation

## 2022-07-26 DIAGNOSIS — I129 Hypertensive chronic kidney disease with stage 1 through stage 4 chronic kidney disease, or unspecified chronic kidney disease: Secondary | ICD-10-CM | POA: Insufficient documentation

## 2022-07-26 DIAGNOSIS — R600 Localized edema: Secondary | ICD-10-CM | POA: Insufficient documentation

## 2022-07-26 DIAGNOSIS — N183 Chronic kidney disease, stage 3 unspecified: Secondary | ICD-10-CM | POA: Insufficient documentation

## 2022-07-26 DIAGNOSIS — K6289 Other specified diseases of anus and rectum: Secondary | ICD-10-CM | POA: Diagnosis not present

## 2022-07-26 DIAGNOSIS — D631 Anemia in chronic kidney disease: Secondary | ICD-10-CM | POA: Insufficient documentation

## 2022-07-26 LAB — POCT HEMOGLOBIN-HEMACUE: Hemoglobin: 9 g/dL — ABNORMAL LOW (ref 12.0–15.0)

## 2022-07-26 MED ORDER — EPOETIN ALFA-EPBX 10000 UNIT/ML IJ SOLN
10000.0000 [IU] | Freq: Once | INTRAMUSCULAR | Status: AC
  Administered 2022-07-26: 10000 [IU] via SUBCUTANEOUS
  Filled 2022-07-26: qty 1

## 2022-07-26 NOTE — Progress Notes (Addendum)
Referring Provider: Fayrene Helper, MD Primary Care Physician:  Fayrene Helper, MD Primary GI Physician:   Chief Complaint  Patient presents with   Abdominal Pain    Patient arrives with daughter Laura Atkins for a follow up. Was not able to do egd and tcs due to the prep with her diet. Has lower abdominal pain. Takes dicyclomine. Reports it does not help.     HPI:   Laura Atkins is a 81 y.o. female with past medical history of dementia, alpha thalassemia trait, anemia, DM, GERD, HLD, HTN, iron deficiency, OSA, PVD   Patient presenting today for follow up of up abdominal pain and IDA.  Last seen June 2023, at that time, previously recommended to undergo EGD and colonoscopy however due to concerns for patient ahering to clear liquids and completing bowel prep, this was canceled. At last visit, she reported lower abdominal pain x4-6 months. No trigger for symptoms.   thorough discussion had with the family regarding the next steps in her care.  They were agreeable to try again a bowel prep but only for 24 hours as she may be more amenable than a 2-day prep.  recommended to continue following with her nephrologist regarding her anemia. prescribed Bentyl as needed to alleviate her abdominal discomfort. MELD labs, CBC, fibrotest, iron panel checked for previous concern of cirrhosis.. EGD/Colonoscopy scheduled which were again canceled.  Last hgb was 8.4 on 07/12/22, iron studies in June were WNL, also normal in February.  fibrotest negative for cirrhosis.  Present:  History is obtain from patient and her daughter Laura Atkins, history from patient somewhat difficult to obtain due to hx of dementia. She continues to have intermittent pain that patient now describes as being in her rectum 2-3x/week. Patient and family have tried to determine if certain foods or other factors tend to bring pain on but there seems to be no consistent patter. Her daughter states that she typically complains of pain  upon waking in the morning. Does not seem to follow eating. She has 3-4 BMs per day that are looser, daughter denies BRB or black stools. She is taking bentyl 33m PRN every 12 hours but does not seem to notice any difference. She does not feel that having a BM improves her pain. Daughter states she could not complete prep previously, she does not feel that she will be able to complete the procedures and wishes to hold off on any further attempts for endoscopic evaluation. Weight is stable, daughter states patient loves to eat sweets. No nausea, vomiting, early satiety. Is following with nephrology and told that anemia is stable.   Last Colonoscopy:07/20/15  Prep suboptimal. She had a lot of thick liquid stool scattered throughout the colon. Cecal landmarks well seen. 25 mm flat ulcerated lesion noted next to ileocecal valve. Multiple biopsies taken. Path only showed ulcerated mucosa. Mucosa of rest of the colon was normal. Normal rectal mucosa. Small hemorrhoids below the dentate line.   Since 2016, she has had at least 3 CT scan with IV contrast(last April 2023) that have been negative for malignancy in the colon.   Last Endoscopy:never   Past Medical History:  Diagnosis Date   Alpha thalassemia trait 01/26/2010   02/2012: Nl CBC ex H&H-10.7/34.8, MCV-69    Anemia    Anxiety    Anxiety and depression    Cellulitis 05/09/2017   Depression    Diabetes mellitus    Foot pain, right 04/30/2013   GERD (gastroesophageal reflux disease)  Headache(784.0)    Hyperlipidemia    Hypertension    Iron deficiency 01/21/2017   Microcytic anemia 01/26/2010   02/2012: Nl CBC ex H&H-10.7/34.8, MCV-69    NECK PAIN, CHRONIC 10/21/2008   +chronic back pain     Obesity    Obstructive sleep apnea    Osteoarthritis    Left knee; right shoulder; chronic neck and back pain   Pruritus    PVD (peripheral vascular disease) ([place]) 01/28/2014   Seizures (HCC)    Shoulder pain, right 04/14/2015   Tremor     This started months ago after her seizure progressing to very poor hand writing   Urinary incontinence    UTI (urinary tract infection) 01/18/2013    Past Surgical History:  Procedure Laterality Date   ABDOMINAL HYSTERECTOMY     BREAST EXCISIONAL BIOPSY     Left; cyst   CATARACT EXTRACTION Right    12/2017   CATARACT EXTRACTION W/ INTRAOCULAR LENS IMPLANT Left 09/07/2013   CHOLECYSTECTOMY     COLONOSCOPY     COLONOSCOPY N/A 07/20/2015   Procedure: COLONOSCOPY;  Surgeon: Rogene Houston, MD;  Location: [facility];  Service: Endoscopy;  Laterality: N/A;  930   EYE SURGERY Left 09/07/2013   cataract    Current Outpatient Medications  Medication Sig Dispense Refill   Accu-Chek FastClix Lancets MISC 1 each by Does not apply route 4 (four) times daily. Use to monitor glucose levels 4 times daily; E11.65 306 each 2   Alcohol Swabs (B-D SINGLE USE SWABS REGULAR) PADS 1 each by Does not apply route 4 (four) times daily. Use to prep site for glucose monitoring 4 times daily; E11.65 300 each 2   amLODipine (NORVASC) 5 MG tablet TAKE 1 TABLET BY MOUTH EVERY DAY 30 tablet 3   aspirin 81 MG tablet Take 81 mg by mouth daily.     atorvastatin (LIPITOR) 40 MG tablet TAKE 1 TABLET BY MOUTH EVERY DAY 30 tablet 2   benazepril (LOTENSIN) 20 MG tablet TAKE 1 TABLET BY MOUTH EVERY DAY 30 tablet 5   Blood Glucose Monitoring Suppl (ACCU-CHEK GUIDE ME) w/Device KIT 1 each by Does not apply route 4 (four) times daily. Use to monitor glucose levels 4 times daily; E11.65 1 kit 0   buPROPion (WELLBUTRIN XL) 150 MG 24 hr tablet Take 1 tablet (150 mg total) by mouth every morning. 90 tablet 2   Cholecalciferol 125 MCG (5000 UT) TABS Take 5,000 Units by mouth daily.     dicyclomine (BENTYL) 10 MG capsule Take 1 capsule (10 mg total) by mouth every 12 (twelve) hours as needed for spasms. 180 capsule 1   diphenhydrAMINE (BENADRYL) 25 MG tablet Take 25 mg by mouth at bedtime.     donepezil (ARICEPT) 5 MG tablet  TAKE 1 TABLET BY MOUTH AT BEDTIME 30 tablet 2   gabapentin (NEURONTIN) 400 MG capsule TAKE ONE CAPSULE BY MOUTH EVERY MORNING and TAKE ONE CAPSULE AT NOON and TAKE TWO CAPSULES AT BEDTIME 360 capsule 1   glucose blood (ACCU-CHEK GUIDE) test strip USE TO TEST BLOOD SUGAR FOUR TIMES DAILY E11.65 200 strip 1   glucose blood test strip 1 each by Other route in the morning, at noon, in the evening, and at bedtime. Use as instructed qid. E11.65     insulin degludec (TRESIBA FLEXTOUCH) 200 UNIT/ML FlexTouch Pen Inject 12 Units into the skin at bedtime. 12 mL 3   Insulin Pen Needle (PEN NEEDLES) 31G X 5 MM MISC Use  to inject insulin once daily 100 each 4   Insulin Syringe-Needle U-100 (INSULIN SYRINGE 1CC/31GX5/16") 31G X 5/16" 1 ML MISC 1 each by Does not apply route daily. Use to inject insulin daily; E11.65 90 each 2   Multiple Vitamin (MULTIVITAMIN) capsule Take 1 capsule by mouth daily.     omeprazole (PRILOSEC) 20 MG capsule TAKE ONE CAPSULE BY MOUTH EVERY DAY 30 capsule 2   sertraline (ZOLOFT) 100 MG tablet Take 1 tablet (100 mg total) by mouth daily. 90 tablet 2   torsemide (DEMADEX) 20 MG tablet TAKE 2 TABLETS BY MOUTH EVERY DAY and additionally TAKE 1 TABLET EVERY OTHER DAY 225 tablet 2   UNABLE TO FIND Walker x 1  DX unsteady gait, osteoarthritis of left knee 1 each 0   UNABLE TO FIND Elevated [place] Seat x 1 DX: unsteady gait, back pain, osteoarthritis of left knee 1 each 0   UNABLE TO FIND Standing upright walker x 1  DX M54.40, M19.90 1 each 0   UNABLE TO FIND Incontinence pads and supplies 1 each 1   UNABLE TO FIND Rollator Walker 1 Product 0   UNABLE TO FIND XL Tranquility Briefs for incontinence. 1 Package 3   UNABLE TO FIND Premium overnight disposible absorbant underwear XL UPC- [id number] 14 each 11   No current facility-administered medications for this visit.    Allergies as of 07/26/2022 - Review Complete 07/26/2022  Allergen Reaction Noted   Penicillins Shortness Of  Breath, Itching, and Rash 09/05/2007   Prednisone Shortness Of Breath, Itching, and Rash 09/05/2007   Propoxyphene n-acetaminophen Itching and Nausea And Vomiting 09/05/2007   Sulfa antibiotics Itching and Nausea And Vomiting 09/05/2007    Family History  Problem Relation Age of Onset   Lung cancer Mother 65   Kidney disease Father    Diabetes Sister    Keloids Brother    ADD / ADHD Grandchild    Bipolar disorder Grandchild    Bipolar disorder Daughter    Seizures Daughter    Heart disease Daughter    Kidney disease Son    Neuropathy Son    Kidney disease Son    Edema Daughter    Breast cancer Daughter 53   Allergies Daughter    Alcohol abuse Neg Hx    Drug abuse Neg Hx     Social History   Socioeconomic History   Marital status: Married    Spouse name: Laura Atkins   Number of children: 5   Years of education: 12   Highest education level: Not on file  Occupational History   Occupation: Disabled     Employer: RETIRED  Tobacco Use   Smoking status: Never    Passive exposure: Past   Smokeless tobacco: Never  Vaping Use   Vaping Use: Never used  Substance and Sexual Activity   Alcohol use: No    Alcohol/week: 0.0 standard drinks of alcohol   Drug use: No   Sexual activity: Yes    Birth control/protection: Surgical  Other Topics Concern   Not on file  Social History Narrative   07/27/20 Patient lives at home with her husband Laura Atkins) and dgtr- Laura Atkins.  Patient is retired.    Right handed.    Five Children.    Caffeine- 2 daily   Social Determinants of Health   Financial Resource Strain: Low Risk  (04/18/2022)   Overall Financial Resource Strain (CARDIA)    Difficulty of Paying Living Expenses: Not hard at all  Food Insecurity: No  Food Insecurity (04/18/2022)   Hunger Vital Sign    Worried About Running Out of Food in the Last Year: Never true    Ran Out of Food in the Last Year: Never true  Transportation Needs: No Transportation Needs (04/18/2022)   PRAPARE  - Hydrologist (Medical): No    Lack of Transportation (Non-Medical): No  Physical Activity: Inactive (04/18/2022)   Exercise Vital Sign    Days of Exercise per Week: 0 days    Minutes of Exercise per Session: 0 min  Stress: No Stress Concern Present (04/18/2022)   [place]    Feeling of Stress : Not at all  Social Connections: Moderately Integrated (04/18/2022)   Social Connection and Isolation Panel [NHANES]    Frequency of Communication with Friends and Family: More than three times a week    Frequency of Social Gatherings with Friends and Family: More than three times a week    Attends Religious Services: More than 4 times per year    Active Member of [employer] or Organizations: No    Attends Music therapist: Never    Marital Status: Married   Review of systems General: negative for malaise, night sweats, fever, chills, weight loss Neck: Negative for lumps, goiter, pain and significant neck swelling Resp: Negative for cough, wheezing, dyspnea at rest CV: Negative for chest pain, leg swelling, palpitations, orthopnea GI: denies melena, hematochezia, nausea, vomiting, diarrhea, constipation, dysphagia, odyonophagia, early satiety or unintentional weight loss. +rectal pain MSK: Negative for joint pain or swelling, back pain, and muscle pain. Derm: Negative for itching or rash Psych: Denies depression, anxiety, memory loss, confusion. No homicidal or suicidal ideation.  Heme: Negative for prolonged bleeding, bruising easily, and swollen nodes. Endocrine: Negative for cold or heat intolerance, polyuria, polydipsia and goiter. Neuro: negative for tremor, gait imbalance, syncope and seizures. The remainder of the review of systems is noncontributory.  Physical Exam: BP (!) 151/60 (BP Location: Right Arm, Patient Position: Sitting, Cuff Size: Large)   Pulse 63   Temp 98 F (36.7 C)  (Oral)   Ht 5' 8" (1.727 m)   BMI 39.22 kg/m  General:   Alert and oriented. No distress noted. Pleasant and cooperative.  Head:  Normocephalic and atraumatic. Eyes:  Conjuctiva clear without scleral icterus. Mouth:  Oral mucosa pink and moist. Good dentition. No lesions. Heart: Normal rate and rhythm, s1 and s2 heart sounds present.  Lungs: Clear lung sounds in all lobes. Respirations equal and unlabored. Abdomen:  +BS, soft, non-tender and non-distended. No rebound or guarding. No HSM or masses noted. Derm: No palmar erythema or jaundice Msk:  Symmetrical without gross deformities. Normal posture. Extremities:  Without edema. Neurologic:  Alert and  oriented x4 Psych:  Alert and cooperative. Normal mood and affect.  Invalid input(s): "6 MONTHS"   ASSESSMENT: MAKAILAH SLAVICK is a 81 y.o. female presenting today for follow up of abdominal pain/rectal pain.   Patient presents with her daughter who helps provide History.  Patient previously complaining of lower abdominal pain but today describes it more as pain in her rectum.  There seem to be no precipitating or alleviating factors.  Pain is not worse after eating.  She has had no weight loss or changes in appetite.  She has no rectal bleeding or melena.  Having 3-4 bowel movements per day.  She has been treated with the Levsin and Bentyl without relief of symptoms.  Last colonoscopy in 2016 with presence of internal hemorrhoids. Query if Symptoms could be related to history of hemorrhoids. No rectal exam today as patient has limited mobility and is in a wheelchair. We will stop Bentyl as this is not providing any relief of her symptoms.  Will send [employer] hemorrhoid cream compound to attempt to treat hemorrhoids as source of her pain.   Patient has ongoing history of anemia with baseline hemoglobin around 8, notably iron studies in February and June of this year were within normal limits.  She is not on any supplemental iron at  this time. No rectal bleeding or melena. No SOB, fatigue or dizziness. She is following with nephrology for monitoring of her anemia.  She has had 3 CT scans of her abdomen since 2016 without any presence of colonic malignancy.  Prep for colonoscopy has been attempted twice more recently however given patient's history of dementia she has been unable to complete the prep or follow clear liquid diet.  At this time family would like to hold off on any further attempts at endoscopic evaluation for the patient.  While I think this is reasonable given patient's history and current mental status I did discuss with patient's daughter Laura Atkins that we ultimately cannot rule out malignancy as source of her anemia.  Patient's daughter verbalized understanding and still wishes to hold off on any further attempts at endoscopic evaluation.  She should continue to follow with nephrology regarding her anemia and make me aware of any rectal bleeding or melena.  PLAN:  Can stop dicyclomine  2. [facility] hemorrhoid compound cream QID (use q tip to apply inside of rectum) 3. Avoid straining, ample water intake, high fiber diet 4. Family to let me know of any rectal bleeding, melena  All questions were answered, patient and daughter Laura Atkins verbalized understanding and are in agreement with plan as outlined above.    Follow Up: 3 months   Luvia Orzechowski L. Alver Sorrow, MSN, APRN, AGNP-C Adult-Gerontology Nurse Practitioner [facility]  I have reviewed the note and agree with the APP's assessment as described in this progress note  Maylon Peppers, MD Gastroenterology and Hepatology [facility]

## 2022-07-26 NOTE — Patient Instructions (Signed)
It was so nice to see you! For now we will plan to hold off on EGD and colonoscopy as discussed. She can stop dicyclomine that she is taking every 12 hours as this does not seem to be providing any relief We will try to treat her for hemorrhoids to see if this provides any relief. I am sending a cream she can use 4x/day, you can apply this to a qtip and insert into her rectum  Continue to drink plenty of water and avoid straining when having  a BM Please let me know if she has rectal bleeding or black stools  Follow up 3 months

## 2022-07-29 ENCOUNTER — Other Ambulatory Visit (HOSPITAL_COMMUNITY): Payer: Self-pay | Admitting: Psychiatry

## 2022-07-29 ENCOUNTER — Other Ambulatory Visit: Payer: Self-pay | Admitting: Family Medicine

## 2022-07-30 NOTE — Telephone Encounter (Signed)
Call for appt

## 2022-08-06 ENCOUNTER — Ambulatory Visit: Payer: Medicare PPO | Admitting: Podiatry

## 2022-08-09 ENCOUNTER — Encounter (HOSPITAL_COMMUNITY)
Admission: RE | Admit: 2022-08-09 | Discharge: 2022-08-09 | Disposition: A | Discharge status: Home / Self Care | Payer: Medicare PPO | Source: Ambulatory Visit | Attending: Nephrology | Admitting: Nephrology

## 2022-08-09 VITALS — BP 156/52 | Temp 98.2°F | Resp 16 | Ht 68.0 in | Wt 257.9 lb

## 2022-08-09 DIAGNOSIS — E1136 Type 2 diabetes mellitus with diabetic cataract: Secondary | ICD-10-CM | POA: Diagnosis not present

## 2022-08-09 DIAGNOSIS — R600 Localized edema: Secondary | ICD-10-CM | POA: Diagnosis not present

## 2022-08-09 DIAGNOSIS — E1122 Type 2 diabetes mellitus with diabetic chronic kidney disease: Secondary | ICD-10-CM | POA: Diagnosis not present

## 2022-08-09 DIAGNOSIS — D631 Anemia in chronic kidney disease: Secondary | ICD-10-CM | POA: Diagnosis not present

## 2022-08-09 DIAGNOSIS — Z79899 Other long term (current) drug therapy: Secondary | ICD-10-CM | POA: Diagnosis not present

## 2022-08-09 DIAGNOSIS — N183 Chronic kidney disease, stage 3 unspecified: Secondary | ICD-10-CM | POA: Diagnosis not present

## 2022-08-09 DIAGNOSIS — I129 Hypertensive chronic kidney disease with stage 1 through stage 4 chronic kidney disease, or unspecified chronic kidney disease: Secondary | ICD-10-CM | POA: Diagnosis not present

## 2022-08-09 DIAGNOSIS — N184 Chronic kidney disease, stage 4 (severe): Secondary | ICD-10-CM

## 2022-08-09 LAB — POCT HEMOGLOBIN-HEMACUE: Hemoglobin: 9.3 g/dL — ABNORMAL LOW (ref 12.0–15.0)

## 2022-08-09 MED ORDER — EPOETIN ALFA-EPBX 10000 UNIT/ML IJ SOLN
INTRAMUSCULAR | Status: AC
  Filled 2022-08-09: qty 1

## 2022-08-09 MED ORDER — EPOETIN ALFA-EPBX 10000 UNIT/ML IJ SOLN
10000.0000 [IU] | Freq: Once | INTRAMUSCULAR | Status: AC
  Administered 2022-08-09: 10000 [IU] via SUBCUTANEOUS

## 2022-08-13 ENCOUNTER — Other Ambulatory Visit: Payer: Self-pay | Admitting: Nurse Practitioner

## 2022-08-14 ENCOUNTER — Ambulatory Visit (INDEPENDENT_AMBULATORY_CARE_PROVIDER_SITE_OTHER): Payer: Medicare PPO | Admitting: Family Medicine

## 2022-08-14 ENCOUNTER — Encounter: Payer: Self-pay | Admitting: Family Medicine

## 2022-08-14 VITALS — BP 142/70 | HR 48 | Ht 68.0 in | Wt 254.0 lb

## 2022-08-14 DIAGNOSIS — I10 Essential (primary) hypertension: Secondary | ICD-10-CM

## 2022-08-14 DIAGNOSIS — Z794 Long term (current) use of insulin: Secondary | ICD-10-CM | POA: Diagnosis not present

## 2022-08-14 DIAGNOSIS — Z7409 Other reduced mobility: Secondary | ICD-10-CM | POA: Diagnosis not present

## 2022-08-14 DIAGNOSIS — R001 Bradycardia, unspecified: Secondary | ICD-10-CM | POA: Diagnosis not present

## 2022-08-14 DIAGNOSIS — N1832 Chronic kidney disease, stage 3b: Secondary | ICD-10-CM | POA: Diagnosis not present

## 2022-08-14 DIAGNOSIS — F5104 Psychophysiologic insomnia: Secondary | ICD-10-CM | POA: Diagnosis not present

## 2022-08-14 DIAGNOSIS — G4700 Insomnia, unspecified: Secondary | ICD-10-CM | POA: Insufficient documentation

## 2022-08-14 DIAGNOSIS — E1122 Type 2 diabetes mellitus with diabetic chronic kidney disease: Secondary | ICD-10-CM

## 2022-08-14 MED ORDER — TRAZODONE HCL 50 MG PO TABS: ORAL_TABLET | ORAL | 3 refills | Status: DC

## 2022-08-14 NOTE — Patient Instructions (Signed)
Keep appointment in feb , call if you need me sooner  New medication trazodone 50 mg half a tablet at bedtime please take at 1130 every night for sleep.  Please schedule TV to be turned off at midnight.   The only light on in the  room should be a night light at sleep time.  Please use your walker all the time as we discussed to reduce your risk of falling.  Remember good sleep is necessary for good health.  Best for the season and 2024  Thanks for choosing [facility], we consider it a privelige to serve you.

## 2022-08-14 NOTE — Assessment & Plan Note (Signed)
Refer to Cardiology, 6 month f/u is due

## 2022-08-14 NOTE — Assessment & Plan Note (Signed)
Currnetly uncontrolled , low pulse rate, will involve cardiology with med manageent DASH diet and commitment to daily physical activity for a minimum of 30 minutes discussed and encouraged, as a part of hypertension management. The importance of attaining a healthy weight is also discussed.     08/14/2022   11:07 AM 08/09/2022    2:07 PM 07/26/2022    1:40 PM 07/26/2022    8:03 AM 07/12/2022    1:53 PM 06/28/2022    2:01 PM 06/22/2022    4:31 PM  BP/Weight  Systolic BP 825 749 355 217 471 595 396  Diastolic BP 70 52 58 60 60 65 66  Wt. (Lbs) 254 257.94    257.94   BMI 38.62 kg/m2 39.22 kg/m2    39.22 kg/m2

## 2022-08-14 NOTE — Assessment & Plan Note (Signed)
  Patient re-educated about  the importance of commitment to a  minimum of 150 minutes of exercise per week as able.  The importance of healthy food choices with portion control discussed, as well as eating regularly and within a 12 hour window most days. The need to choose "clean , green" food 50 to 75% of the time is discussed, as well as to make water the primary drink and set a goal of 64 ounces water daily.       08/14/2022   11:07 AM 08/09/2022    2:07 PM 07/26/2022    8:03 AM  Weight /BMI  Weight 254 lb 257 lb 15 oz   Height 5\' 8"  (1.727 m) 5\' 8"  (1.727 m) 5\' 8"  (1.727 m)  BMI 38.62 kg/m2 39.22 kg/m2 39.22 kg/m2

## 2022-08-14 NOTE — Assessment & Plan Note (Signed)
Managed by Endo Ms. Sukhu is reminded of the importance of commitment to daily physical activity for 30 minutes or more, as able and the need to limit carbohydrate intake to 30 to 60 grams per meal to help with blood sugar control.   The need to take medication as prescribed, test blood sugar as directed, and to call between visits if there is a concern that blood sugar is uncontrolled is also discussed.   Ms. Folden is reminded of the importance of daily foot exam, annual eye examination, and good blood sugar, blood pressure and cholesterol control. Markedly improved  and currently controlled     Latest Ref Rng & Units 05/10/2022    2:10 PM 05/01/2022    3:02 PM 03/22/2022    3:34 PM 10/24/2021    1:13 PM 10/24/2021   12:18 PM  Diabetic Labs  HbA1c 4.0 - 5.6 % 6.9    7.8    Chol 100 - 199 mg/dL     153   HDL >39 mg/dL     60   Calc LDL 0 - 99 mg/dL     70   Triglycerides 0 - 149 mg/dL     136   Creatinine 0.44 - 1.00 mg/dL  1.71  2.02   1.65       08/14/2022   11:07 AM 08/09/2022    2:07 PM 07/26/2022    1:40 PM 07/26/2022    8:03 AM 07/12/2022    1:53 PM 06/28/2022    2:01 PM 06/22/2022    4:31 PM  BP/Weight  Systolic BP 102 111 735 670 141 030 131  Diastolic BP 70 52 58 60 60 65 66  Wt. (Lbs) 254 257.94    257.94   BMI 38.62 kg/m2 39.22 kg/m2    39.22 kg/m2       Latest Ref Rng & Units 12/01/2021    9:40 AM 02/15/2021   12:00 AM  Foot/eye exam completion dates  Eye Exam No Retinopathy  Retinopathy      Foot Form Completion  Done      This result is from an external source.

## 2022-08-14 NOTE — Assessment & Plan Note (Signed)
Home safety and the need to use the walker is discussed , she agrees

## 2022-08-14 NOTE — Assessment & Plan Note (Signed)
Sleep hygiene reviewed and written information offered also. Prescription sent for  medication needed.  

## 2022-08-14 NOTE — Progress Notes (Signed)
Laura Atkins     MRN: [id number]      DOB: 16-Mar-1941   HPI [name] is here for follow up and re-evaluation of chronic medical conditions, medication management and review of any available recent lab and radiology data.  Preventive health is updated, specifically  Cancer screening and Immunization.   Questions or concerns regarding consultations or procedures which the PT has had in the interim are  addressed. The PT denies any adverse reactions to current medications since the last visit.  C/o  poor sleep, has TV on all night Not using walker in the home and is unsteady ROS Denies recent fever or chills. Denies sinus pressure, nasal congestion, ear pain or sore throat. Denies chest congestion, productive cough or wheezing. Denies chest pains, palpitations and leg swelling Denies abdominal pain, nausea, vomiting,diarrhea or constipation.   Denies dysuria, frequency, hesitancy chronic  incontinence. C/o chronic  joint pain, swelling and limitation in mobility. Denies headaches, seizures, numbness, or tingling. Denies uncontrolled  depression, anxiety c/o  insomnia. Denies skin break down or rash.   PE  BP (!) 142/70 (BP Location: Right Arm, Patient Position: Sitting, Cuff Size: Large)   Pulse (!) 48   Ht 5\' 8"  (1.727 m)   Wt 254 lb (115.2 kg)   SpO2 95%   BMI 38.62 kg/m   Patient alert and oriented and in no cardiopulmonary distress.  HEENT: No facial asymmetry, EOMI,     Neck supple .  Chest: Clear to auscultation bilaterally.  CVS: S1, S2 no murmurs, no S3.Regular rate.  ABD: Soft non tender.   Ext: No edema  MS: Decreased  ROM spine, shoulders, hips and knees.  Skin: Intact, no ulcerations or rash noted.  Psych: Good eye contact, normal affect.  not anxious or depressed appearing.  CNS: CN 2-12 intact, power,  normal throughout.no focal deficits noted.   Assessment & Plan  Essential hypertension Currnetly uncontrolled , low pulse rate, will involve  cardiology with med manageent DASH diet and commitment to daily physical activity for a minimum of 30 minutes discussed and encouraged, as a part of hypertension management. The importance of attaining a healthy weight is also discussed.     08/14/2022   11:07 AM 08/09/2022    2:07 PM 07/26/2022    1:40 PM 07/26/2022    8:03 AM 07/12/2022    1:53 PM 06/28/2022    2:01 PM 06/22/2022    4:31 PM  BP/Weight  Systolic BP 144 315 400 [phone] 326  Diastolic BP 70 52 58 60 60 65 66  Wt. (Lbs) 254 257.94    257.94   BMI 38.62 kg/m2 39.22 kg/m2    39.22 kg/m2        Type 2 diabetes mellitus with diabetic chronic kidney disease ([place]) Managed by [name] is reminded of the importance of commitment to daily physical activity for 30 minutes or more, as able and the need to limit carbohydrate intake to 30 to 60 grams per meal to help with blood sugar control.   The need to take medication as prescribed, test blood sugar as directed, and to call between visits if there is a concern that blood sugar is uncontrolled is also discussed.   Laura Atkins is reminded of the importance of daily foot exam, annual eye examination, and good blood sugar, blood pressure and cholesterol control. Markedly improved  and currently controlled     Latest Ref Rng & Units 05/10/2022    2:10  PM 05/01/2022    3:02 PM 03/22/2022    3:34 PM 10/24/2021    1:13 PM 10/24/2021   12:18 PM  Diabetic Labs  HbA1c 4.0 - 5.6 % 6.9    7.8    Chol 100 - 199 mg/dL     153   HDL >39 mg/dL     60   Calc LDL 0 - 99 mg/dL     70   Triglycerides 0 - 149 mg/dL     136   Creatinine 0.44 - 1.00 mg/dL  1.71  2.02   1.65       08/14/2022   11:07 AM 08/09/2022    2:07 PM 07/26/2022    1:40 PM 07/26/2022    8:03 AM 07/12/2022    1:53 PM 06/28/2022    2:01 PM 06/22/2022    4:31 PM  BP/Weight  Systolic BP 062 694 854 627 035 009 381  Diastolic BP 70 52 58 60 60 65 66  Wt. (Lbs) 254 257.94    257.94   BMI 38.62 kg/m2 39.22 kg/m2     39.22 kg/m2       Latest Ref Rng & Units 12/01/2021    9:40 AM 02/15/2021   12:00 AM  Foot/eye exam completion dates  Eye Exam No Retinopathy  Retinopathy      Foot Form Completion  Done      This result is from an external source.        Insomnia Sleep hygiene reviewed and written information offered also. Prescription sent for  medication needed.   Morbid obesity ([place])  Patient re-educated about  the importance of commitment to a  minimum of 150 minutes of exercise per week as able.  The importance of healthy food choices with portion control discussed, as well as eating regularly and within a 12 hour window most days. The need to choose "clean , green" food 50 to 75% of the time is discussed, as well as to make water the primary drink and set a goal of 64 ounces water daily.       08/14/2022   11:07 AM 08/09/2022    2:07 PM 07/26/2022    8:03 AM  Weight /BMI  Weight 254 lb 257 lb 15 oz   Height 5\' 8"  (1.727 m) 5\' 8"  (1.727 m) 5\' 8"  (1.727 m)  BMI 38.62 kg/m2 39.22 kg/m2 39.22 kg/m2      Limited mobility Home safety and the need to use the walker is discussed , she agrees

## 2022-08-22 ENCOUNTER — Encounter (INDEPENDENT_AMBULATORY_CARE_PROVIDER_SITE_OTHER): Payer: Medicare PPO | Admitting: Cardiology

## 2022-08-22 ENCOUNTER — Encounter: Payer: Self-pay | Admitting: Cardiology

## 2022-08-22 VITALS — BP 150/70 | HR 78 | Ht 68.0 in | Wt 261.6 lb

## 2022-08-22 DIAGNOSIS — I10 Essential (primary) hypertension: Secondary | ICD-10-CM | POA: Diagnosis not present

## 2022-08-22 DIAGNOSIS — R600 Localized edema: Secondary | ICD-10-CM

## 2022-08-22 MED ORDER — AMLODIPINE BESYLATE 5 MG PO TABS: 7.5000 mg | ORAL_TABLET | Freq: Every day | ORAL | 1 refills | Status: DC

## 2022-08-22 NOTE — Patient Instructions (Signed)
Medication Instructions:  Your physician has recommended you make the following change in your medication:  Increase amlodipine to 7.5 mg once a day Continue all other medications as directed  Labwork: none  Testing/Procedures: none  Follow-Up:  Your physician recommends that you schedule a follow-up appointment in: 6 months  Any Other Special Instructions Will Be Listed Below (If Applicable).  If you need a refill on your cardiac medications before your next appointment, please call your pharmacy.

## 2022-08-22 NOTE — Progress Notes (Signed)
Clinical Summary Laura Atkins is a 81 y.o.female seen today for follow up of the following medical problems.    1. Leg swelling  - chronic leg swelling that overall has been controlled Alternating torsemide 29m and 620m    -taking torsemide 6024maily. Edema is overall stable   2. HTN - compliant with meds  - high bp's at recent pcp visit, 142/70. HR was 48 at the time -has not taken meds yet today      3. HL - compliant with statin.  Last panel Jan 2018 TC 162 TG 90 HDL 68 LDL 76   05/2020 TC 108 TG 74 HDL 49 LDL 44 03/2021 TC 14 TG 111 HDL 58 LDL 66 Jan 2023 TC 153 TG 136 HDL 60 LDL 70 - she is on atorvastatin 66m46mily    5. Alpha thalasemia - followed by hematology   6. CKD III -followed by Dr BhutTheador Hawthorne   7. Anemia - per pcp Past Medical History:  Diagnosis Date   Alpha thalassemia trait 01/26/2010   02/2012: Nl CBC ex H&H-10.7/34.8, MCV-69    Anemia    Anxiety    Anxiety and depression    Cellulitis 05/09/2017   Depression    Diabetes mellitus    Foot pain, right 04/30/2013   GERD (gastroesophageal reflux disease)    Headache(784.0)    Hyperlipidemia    Hypertension    Iron deficiency 01/21/2017   Microcytic anemia 01/26/2010   02/2012: Nl CBC ex H&H-10.7/34.8, MCV-69    NECK PAIN, CHRONIC 10/21/2008   +chronic back pain     Obesity    Obstructive sleep apnea    Osteoarthritis    Left knee; right shoulder; chronic neck and back pain   Pruritus    PVD (peripheral vascular disease) (HCC)[place]/03/2014   Seizures (HCC)    Shoulder pain, right 04/14/2015   Tremor    This started months ago after her seizure progressing to very poor hand writing   Urinary incontinence    UTI (urinary tract infection) 01/18/2013     Allergies  Allergen Reactions   Penicillins Shortness Of Breath, Itching and Rash   Prednisone Shortness Of Breath, Itching and Rash   Propoxyphene N-Acetaminophen Itching and Nausea And Vomiting   Sulfa Antibiotics Itching  and Nausea And Vomiting     Current Outpatient Medications  Medication Sig Dispense Refill   Accu-Chek FastClix Lancets MISC 1 each by Does not apply route 4 (four) times daily. Use to monitor glucose levels 4 times daily; E11.65 306 each 2   Alcohol Swabs (B-D SINGLE USE SWABS REGULAR) PADS 1 each by Does not apply route 4 (four) times daily. Use to prep site for glucose monitoring 4 times daily; E11.65 300 each 2   amLODipine (NORVASC) 5 MG tablet TAKE 1 TABLET BY MOUTH EVERY DAY 30 tablet 3   aspirin 81 MG tablet Take 81 mg by mouth daily.     atorvastatin (LIPITOR) 40 MG tablet TAKE 1 TABLET BY MOUTH EVERY DAY 30 tablet 2   benazepril (LOTENSIN) 20 MG tablet TAKE 1 TABLET BY MOUTH EVERY DAY 30 tablet 5   Blood Glucose Monitoring Suppl (ACCU-CHEK GUIDE ME) w/Device KIT 1 each by Does not apply route 4 (four) times daily. Use to monitor glucose levels 4 times daily; E11.65 1 kit 0   buPROPion (WELLBUTRIN XL) 150 MG 24 hr tablet Take 1 tablet (150 mg total) by mouth every morning. 90Oxford  tablet 2   Cholecalciferol 125 MCG (5000 UT) TABS Take 5,000 Units by mouth daily.     dicyclomine (BENTYL) 10 MG capsule Take 1 capsule (10 mg total) by mouth every 12 (twelve) hours as needed for spasms. 180 capsule 1   diphenhydrAMINE (BENADRYL) 25 MG tablet Take 25 mg by mouth at bedtime.     donepezil (ARICEPT) 5 MG tablet TAKE 1 TABLET BY MOUTH AT BEDTIME 30 tablet 2   gabapentin (NEURONTIN) 400 MG capsule TAKE ONE CAPSULE BY MOUTH EVERY MORNING and TAKE ONE CAPSULE AT NOON and TAKE TWO CAPSULES AT BEDTIME 360 capsule 1   glucose blood (ACCU-CHEK GUIDE) test strip USE TO TEST BLOOD SUGAR FOUR TIMES DAILY E11.65 200 strip 1   glucose blood test strip 1 each by Other route in the morning, at noon, in the evening, and at bedtime. Use as instructed qid. E11.65     insulin degludec (TRESIBA FLEXTOUCH) 200 UNIT/ML FlexTouch Pen Inject 12 Units into the skin at bedtime. 12 mL 3   Insulin Pen Needle (EASY TOUCH PEN  NEEDLES) 31G X 5 MM MISC USE TO INJECT INSULIN ONCE DAILY 100 each 2   Insulin Syringe-Needle U-100 (INSULIN SYRINGE 1CC/31GX5/16") 31G X 5/16" 1 ML MISC 1 each by Does not apply route daily. Use to inject insulin daily; E11.65 90 each 2   Multiple Vitamin (MULTIVITAMIN) capsule Take 1 capsule by mouth daily.     NONFORMULARY OR COMPOUNDED ITEM Apothecary hemorrhoid cream. Apply rectally up to qid as directed.     omeprazole (PRILOSEC) 20 MG capsule TAKE ONE CAPSULE BY MOUTH EVERY DAY 30 capsule 2   sertraline (ZOLOFT) 100 MG tablet TAKE 1 TABLET BY MOUTH DAILY 90 tablet 2   torsemide (DEMADEX) 20 MG tablet TAKE 2 TABLETS BY MOUTH EVERY DAY and additionally TAKE 1 TABLET EVERY OTHER DAY 225 tablet 2   traZODone (DESYREL) 50 MG tablet Take half tablet by mouth at bedtime for sleep 15 tablet 3   UNABLE TO FIND Walker x 1  DX unsteady gait, osteoarthritis of left knee 1 each 0   UNABLE TO FIND Elevated [place] Seat x 1 DX: unsteady gait, back pain, osteoarthritis of left knee 1 each 0   UNABLE TO FIND Standing upright walker x 1  DX M54.40, M19.90 1 each 0   UNABLE TO FIND Incontinence pads and supplies 1 each 1   UNABLE TO FIND Rollator Walker 1 Product 0   UNABLE TO FIND XL Tranquility Briefs for incontinence. 1 Package 3   UNABLE TO FIND Premium overnight disposible absorbant underwear XL UPC- [id number] 14 each 11   No current facility-administered medications for this visit.     Past Surgical History:  Procedure Laterality Date   ABDOMINAL HYSTERECTOMY     BREAST EXCISIONAL BIOPSY     Left; cyst   CATARACT EXTRACTION Right    12/2017   CATARACT EXTRACTION W/ INTRAOCULAR LENS IMPLANT Left 09/07/2013   CHOLECYSTECTOMY     COLONOSCOPY     COLONOSCOPY N/A 07/20/2015   Procedure: COLONOSCOPY;  Surgeon: Rogene Houston, MD;  Location: [facility];  Service: Endoscopy;  Laterality: N/A;  930   EYE SURGERY Left 09/07/2013   cataract     Allergies  Allergen Reactions    Penicillins Shortness Of Breath, Itching and Rash   Prednisone Shortness Of Breath, Itching and Rash   Propoxyphene N-Acetaminophen Itching and Nausea And Vomiting   Sulfa Antibiotics Itching and Nausea And Vomiting  Family History  Problem Relation Age of Onset   Lung cancer Mother 32   Kidney disease Father    Diabetes Sister    Keloids Brother    ADD / ADHD Grandchild    Bipolar disorder Grandchild    Bipolar disorder Daughter    Seizures Daughter    Heart disease Daughter    Kidney disease Son    Neuropathy Son    Kidney disease Son    Edema Daughter    Breast cancer Daughter 82   Allergies Daughter    Alcohol abuse Neg Hx    Drug abuse Neg Hx      Social History Ms. Sterne reports that she has never smoked. She has been exposed to tobacco smoke. She has never used smokeless tobacco. Ms. Posa reports no history of alcohol use.   Review of Systems CONSTITUTIONAL: No weight loss, fever, chills, weakness or fatigue.  HEENT: Eyes: No visual loss, blurred vision, double vision or yellow sclerae.No hearing loss, sneezing, congestion, runny nose or sore throat.  SKIN: No rash or itching.  CARDIOVASCULAR: per hpi RESPIRATORY: No shortness of breath, cough or sputum.  GASTROINTESTINAL: No anorexia, nausea, vomiting or diarrhea. No abdominal pain or blood.  GENITOURINARY: No burning on urination, no polyuria NEUROLOGICAL: No headache, dizziness, syncope, paralysis, ataxia, numbness or tingling in the extremities. No change in bowel or bladder control.  MUSCULOSKELETAL: No muscle, back pain, joint pain or stiffness.  LYMPHATICS: No enlarged nodes. No history of splenectomy.  PSYCHIATRIC: No history of depression or anxiety.  ENDOCRINOLOGIC: No reports of sweating, cold or heat intolerance. No polyuria or polydipsia.  Marland Kitchen   Physical Examination Today's Vitals   08/22/22 1510 08/22/22 1525  BP: (!) 160/60 (!) 150/70  Pulse: 78   SpO2: 96%   Weight: 261 lb 9.6 oz  (118.7 kg)   Height: _0  (1.727 m)    Body mass index is 39.78 kg/m.  Gen: resting comfortably, no acute distress HEENT: no scleral icterus, pupils equal round and reactive, no palptable cervical adenopathy,  CV: RRR, no m/r/g no jvd Resp: Clear to auscultation bilaterally GI: abdomen is soft, non-tender, non-distended, normal bowel sounds, no hepatosplenomegaly MSK: extremities are warm, 1+ bialteral LE edema Skin: warm, no rash Neuro:  no focal deficits Psych: appropriate affect   Diagnostic Studies  01/2013 echo Study Conclusions  - Left ventricle: The cavity size was normal. There was mild   concentric hypertrophy. Systolic function was normal. The   estimated ejection fraction was in the range of 60% to   65%. Wall motion was normal; there were no regional wall   motion abnormalities. - Aortic valve: Mildly calcified annulus. - Right ventricle: The cavity size was normal. Wall   thickness was mildly increased. - Atrial septum: No defect or patent foramen ovale was   identified.   01/2013 MPI IMPRESSION: Probably negative pharmacologic stress nuclear myocardial study revealing normal left ventricular size, normal left ventricular systolic function and no significant stress-induced EKG abnormalities.  On scintigraphic imaging, there was a small and mild defect that likely represents variable breast attenuation; however, the possibility of modest anterior ischemia cannot be unequivocally excluded.   01/2013 Event monitor No significant arrhythmias   12/2014 ABIs: R 1.10 L 1.15   02/2015 Carotid US 1-39% bilateral stenosis     Assessment and Plan   1. Chronic LE edema -mild edema, balance between edema and diuretic with her CKD. No cardiopulmonary symptoms, would continue torsemide at current dose.  2. HTN - above goal, increase norvasc to 7.81m daily. If worsenign LE edema would likely have to consider hydralazine given CKD and intermittent low HRs         JArnoldo Lenis M.D.,

## 2022-08-23 ENCOUNTER — Encounter (HOSPITAL_COMMUNITY)
Admission: RE | Admit: 2022-08-23 | Discharge: 2022-08-23 | Disposition: A | Discharge status: Home / Self Care | Payer: Medicare PPO | Source: Ambulatory Visit | Attending: Nephrology | Admitting: Nephrology

## 2022-08-23 VITALS — BP 148/47 | HR 70 | Temp 97.9°F | Resp 18

## 2022-08-23 DIAGNOSIS — E1122 Type 2 diabetes mellitus with diabetic chronic kidney disease: Secondary | ICD-10-CM | POA: Diagnosis not present

## 2022-08-23 DIAGNOSIS — D631 Anemia in chronic kidney disease: Secondary | ICD-10-CM | POA: Diagnosis not present

## 2022-08-23 DIAGNOSIS — I129 Hypertensive chronic kidney disease with stage 1 through stage 4 chronic kidney disease, or unspecified chronic kidney disease: Secondary | ICD-10-CM | POA: Diagnosis not present

## 2022-08-23 DIAGNOSIS — R600 Localized edema: Secondary | ICD-10-CM | POA: Diagnosis not present

## 2022-08-23 DIAGNOSIS — N183 Chronic kidney disease, stage 3 unspecified: Secondary | ICD-10-CM | POA: Diagnosis not present

## 2022-08-23 DIAGNOSIS — N189 Chronic kidney disease, unspecified: Secondary | ICD-10-CM | POA: Insufficient documentation

## 2022-08-23 DIAGNOSIS — E1136 Type 2 diabetes mellitus with diabetic cataract: Secondary | ICD-10-CM | POA: Diagnosis not present

## 2022-08-23 DIAGNOSIS — Z79899 Other long term (current) drug therapy: Secondary | ICD-10-CM | POA: Diagnosis not present

## 2022-08-23 LAB — POCT HEMOGLOBIN-HEMACUE: Hemoglobin: 9 g/dL — ABNORMAL LOW (ref 12.0–15.0)

## 2022-08-23 MED ORDER — EPOETIN ALFA-EPBX 10000 UNIT/ML IJ SOLN
10000.0000 [IU] | Freq: Once | INTRAMUSCULAR | Status: AC
  Administered 2022-08-23: 10000 [IU] via SUBCUTANEOUS
  Filled 2022-08-23: qty 1

## 2022-08-23 NOTE — Progress Notes (Signed)
Diagnosis: Anemia in Chronic Kidney Disease  Provider:  Manpreet Bhutani MD  Procedure: Injection  Retacrit (epoetin alfa-epbx), Dose: 10000 Units, Site: subcutaneous, Number of injections: 1  Post Care: Observation period completed  Discharge: Condition: Good, Destination: Home . AVS provided to patient.   Performed by:  Oren Beckmann, RN

## 2022-08-27 ENCOUNTER — Other Ambulatory Visit: Payer: Self-pay | Admitting: Family Medicine

## 2022-09-04 IMAGING — MG MM DIGITAL SCREENING BILAT W/ TOMO AND CAD
6 of 10 series · 6 of 30 positions shown · non-contrast
Comparison: Previous exam(s).

CLINICAL DATA: Screening.

EXAM:
DIGITAL SCREENING BILATERAL MAMMOGRAM WITH TOMOSYNTHESIS AND CAD
TECHNIQUE: Bilateral screening digital craniocaudal and mediolateral oblique
mammograms were obtained. Bilateral screening digital breast
tomosynthesis was performed. The images were evaluated with
computer-aided detection.

[R MLO synth-2D (1 of 2)]
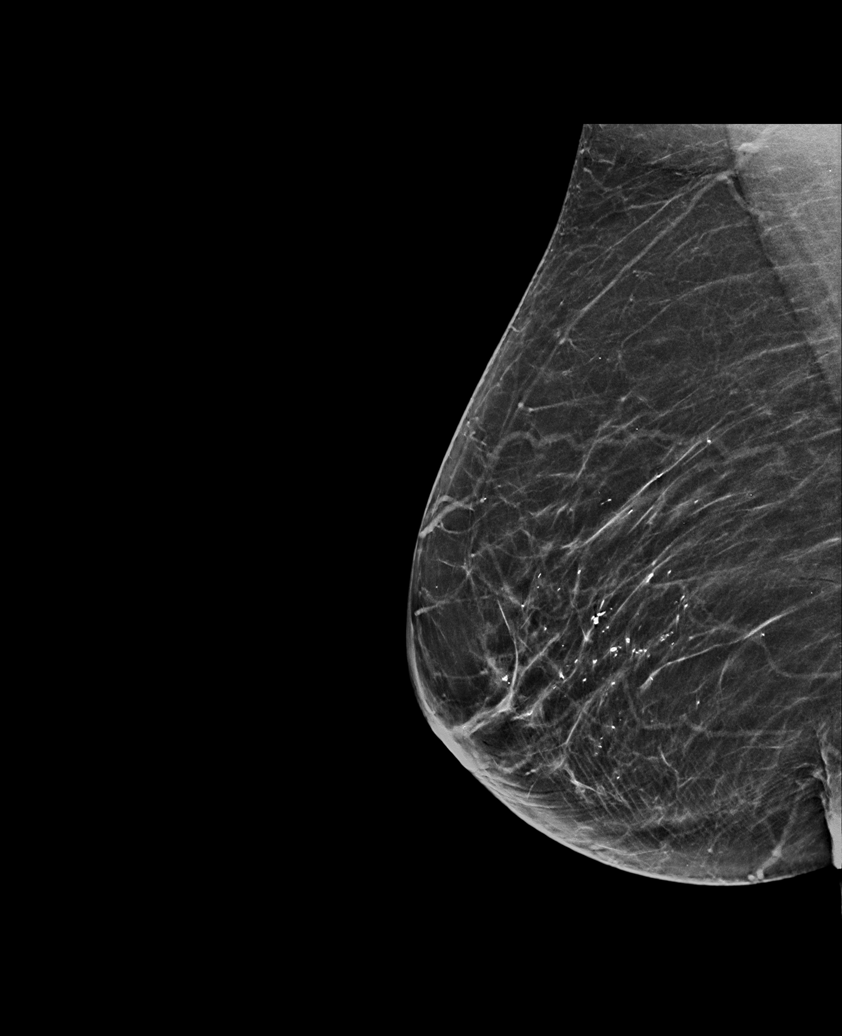

[R MLO synth-2D (2 of 2)]
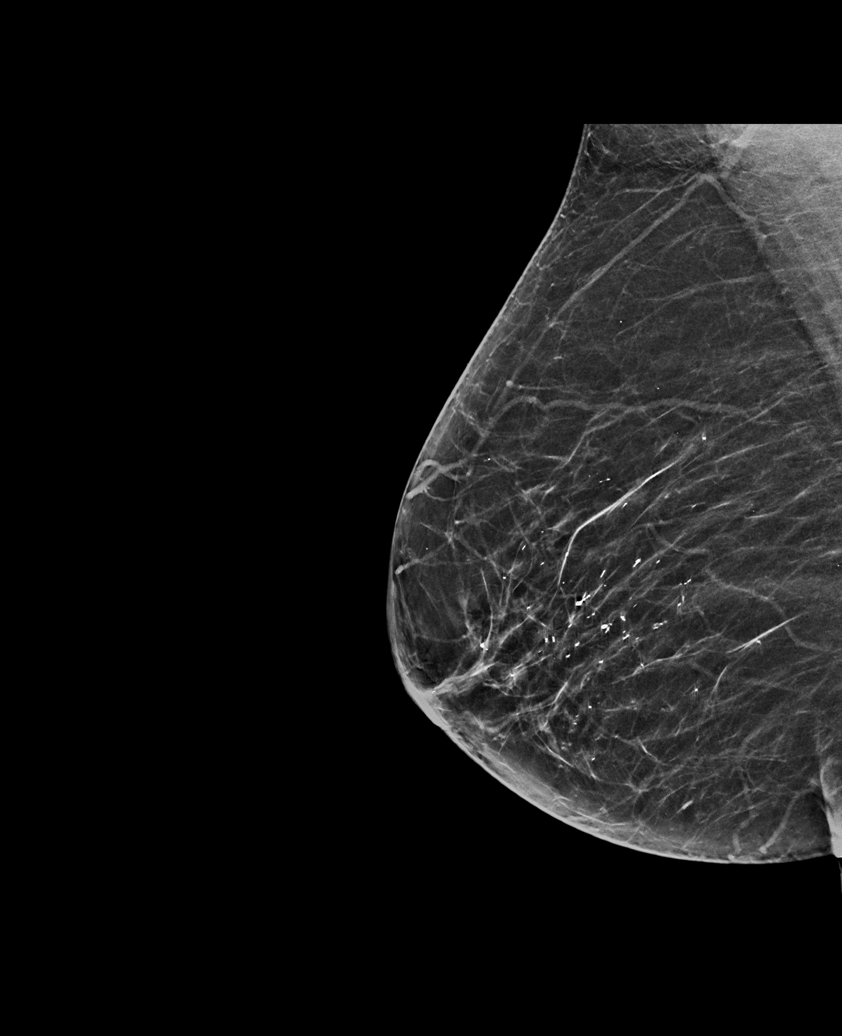

[R CC synth-2D]
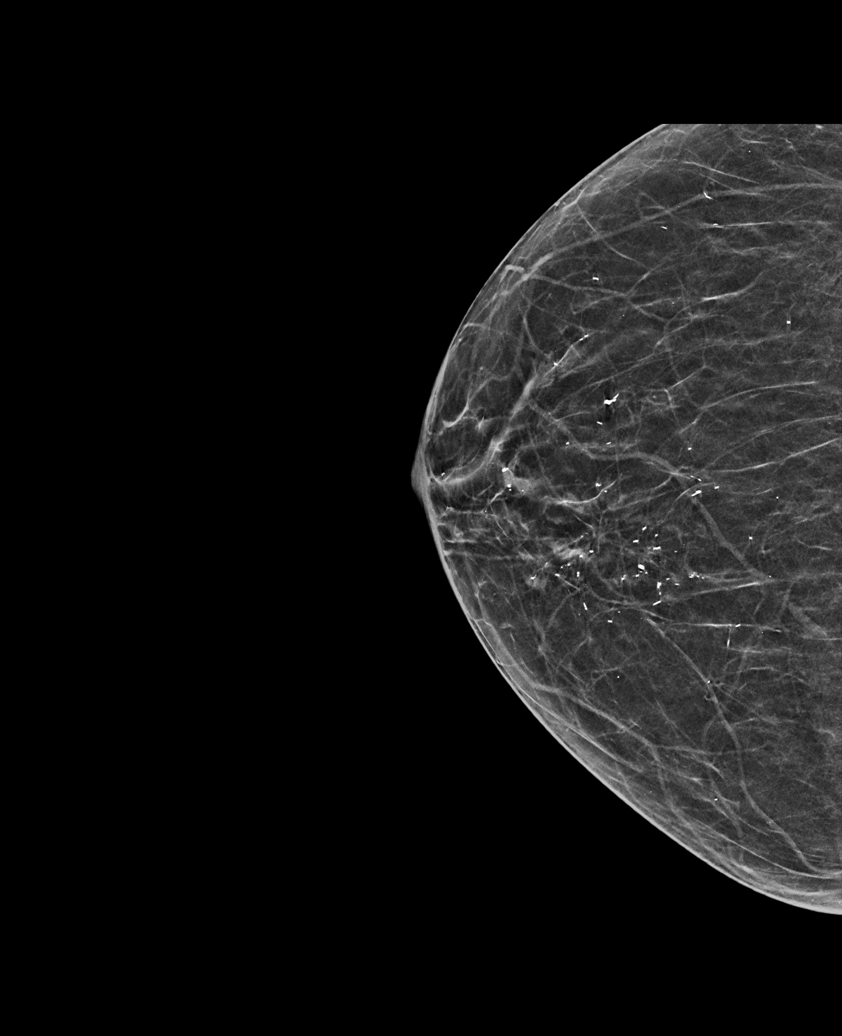

[L CC synth-2D]
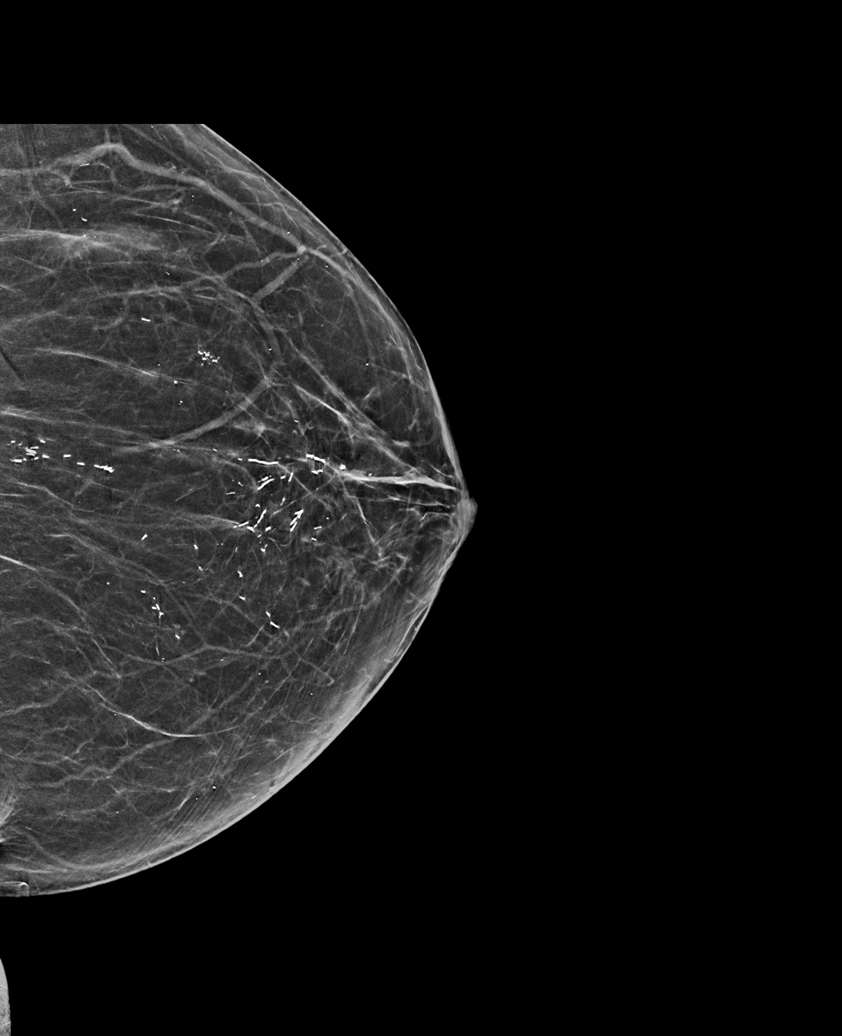

[L MLO synth-2D]
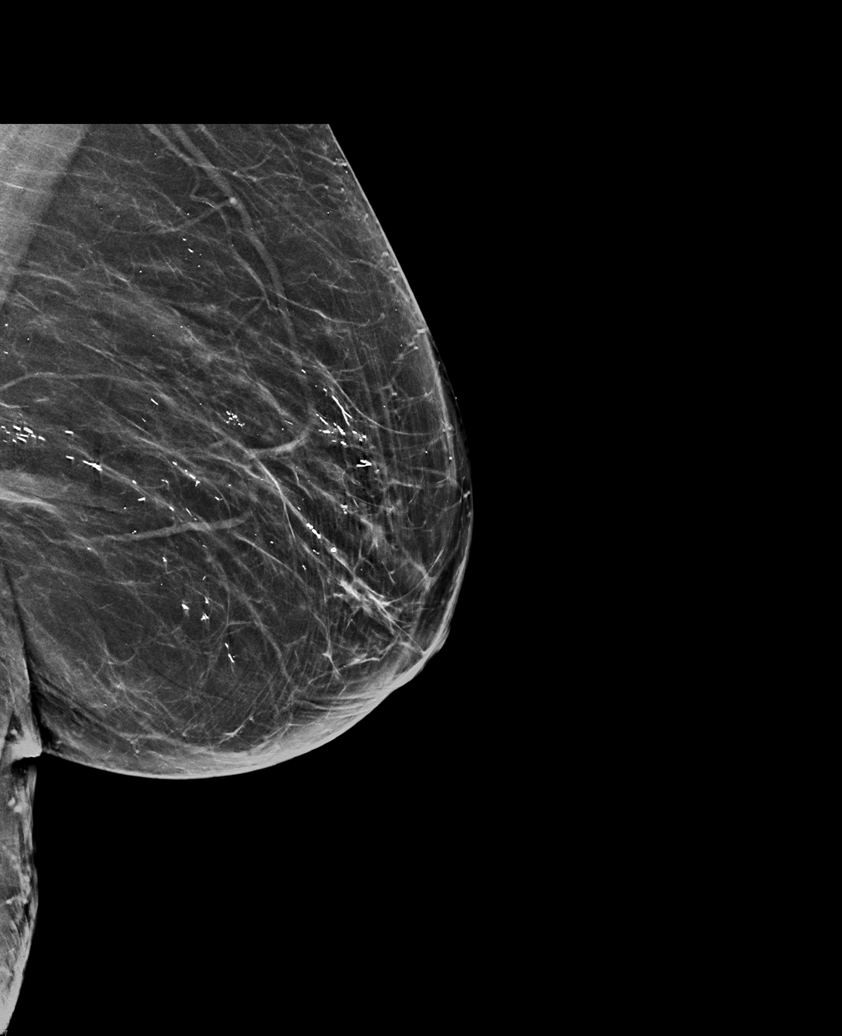

[R CC tomo · tomo slice 23/46.0]
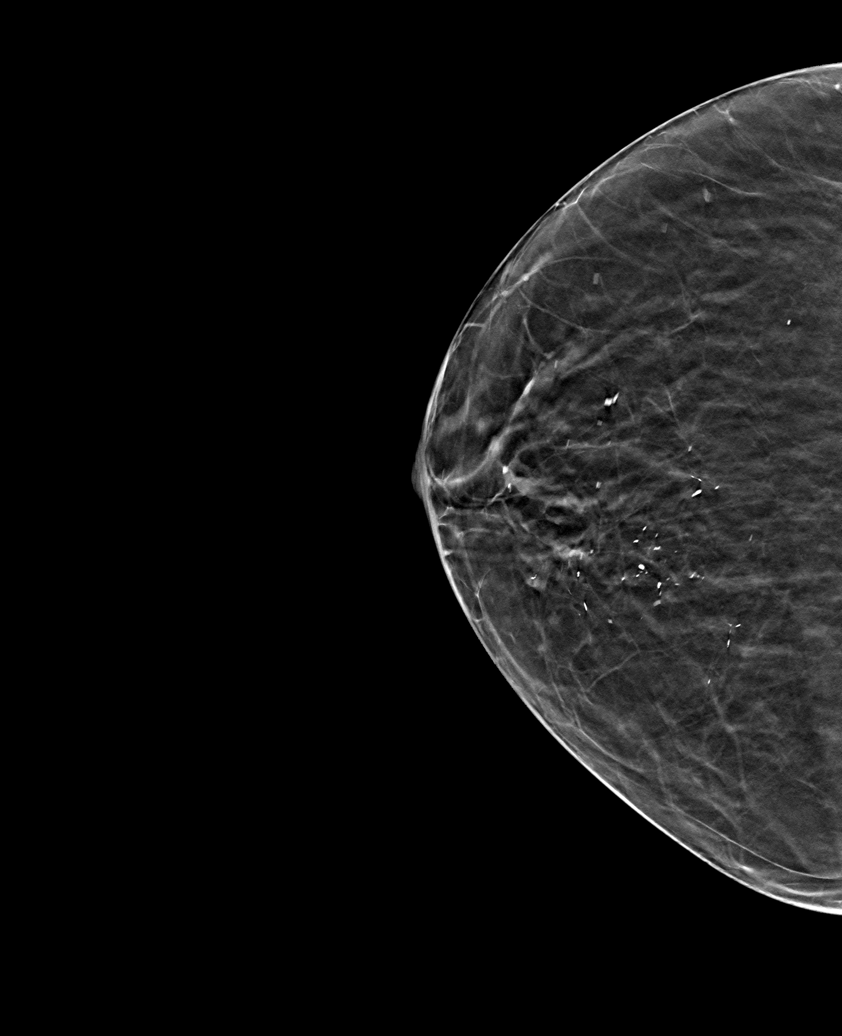

[6 of 30 positions shown; findings below may reference images not displayed]

ACR Breast Density Category b: There are scattered areas of
fibroglandular density.
FINDINGS: There are no findings suspicious for malignancy.
IMPRESSION: No mammographic evidence of malignancy. A result letter of this
screening mammogram will be mailed directly to the patient.

RECOMMENDATION:
Screening mammogram in one year. (Code:51-O-LD2)

BI-RADS CATEGORY  1: Negative.

## 2022-09-06 ENCOUNTER — Encounter (HOSPITAL_COMMUNITY)
Admission: RE | Admit: 2022-09-06 | Discharge: 2022-09-06 | Disposition: A | Discharge status: Home / Self Care | Payer: Medicare PPO | Source: Ambulatory Visit | Attending: Nephrology | Admitting: Nephrology

## 2022-09-06 VITALS — BP 140/78 | HR 43 | Temp 97.9°F | Resp 16

## 2022-09-06 DIAGNOSIS — D631 Anemia in chronic kidney disease: Secondary | ICD-10-CM | POA: Diagnosis not present

## 2022-09-06 DIAGNOSIS — N184 Chronic kidney disease, stage 4 (severe): Secondary | ICD-10-CM | POA: Diagnosis not present

## 2022-09-06 LAB — POCT HEMOGLOBIN-HEMACUE: Hemoglobin: 8.3 g/dL — ABNORMAL LOW (ref 12.0–15.0)

## 2022-09-06 MED ORDER — EPOETIN ALFA-EPBX 10000 UNIT/ML IJ SOLN
10000.0000 [IU] | Freq: Once | INTRAMUSCULAR | Status: AC
  Administered 2022-09-06: 10000 [IU] via SUBCUTANEOUS
  Filled 2022-09-06: qty 1

## 2022-09-06 NOTE — Progress Notes (Signed)
Diagnosis: Anemia in Chronic Kidney Disease  Provider:  Manpreet Bhutani MD  Procedure: Injection  Retacrit (epoetin alfa-epbx), Dose: 10000 Units, Site: subcutaneous, Number of injections: 1   HGB 8.3  Post Care: Patient declined observation  Discharge: Condition: Good, Destination: Home . AVS provided to patient.   Performed by:  Baxter Hire, RN

## 2022-09-07 ENCOUNTER — Other Ambulatory Visit: Payer: Self-pay | Admitting: Nurse Practitioner

## 2022-09-07 DIAGNOSIS — E1165 Type 2 diabetes mellitus with hyperglycemia: Secondary | ICD-10-CM

## 2022-09-10 ENCOUNTER — Ambulatory Visit: Payer: Medicare PPO | Admitting: Podiatry

## 2022-09-13 ENCOUNTER — Encounter: Payer: Self-pay | Admitting: Nurse Practitioner

## 2022-09-13 ENCOUNTER — Ambulatory Visit: Payer: Medicare PPO | Admitting: Nurse Practitioner

## 2022-09-13 VITALS — BP 130/86 | HR 76 | Ht 68.0 in | Wt 260.0 lb

## 2022-09-13 DIAGNOSIS — E782 Mixed hyperlipidemia: Secondary | ICD-10-CM | POA: Diagnosis not present

## 2022-09-13 DIAGNOSIS — N1832 Chronic kidney disease, stage 3b: Secondary | ICD-10-CM

## 2022-09-13 DIAGNOSIS — I10 Essential (primary) hypertension: Secondary | ICD-10-CM | POA: Diagnosis not present

## 2022-09-13 DIAGNOSIS — Z794 Long term (current) use of insulin: Secondary | ICD-10-CM | POA: Diagnosis not present

## 2022-09-13 DIAGNOSIS — E1122 Type 2 diabetes mellitus with diabetic chronic kidney disease: Secondary | ICD-10-CM | POA: Diagnosis not present

## 2022-09-13 LAB — POCT GLYCOSYLATED HEMOGLOBIN (HGB A1C): HbA1c POC (<> result, manual entry): 6.9 % (ref 4.0–5.6)

## 2022-09-13 NOTE — Progress Notes (Signed)
Endocrinology Follow Up Note       09/13/2022, 11:13 AM   Subjective:    Patient ID: Laura Atkins, female    DOB: 06-17-1941.  Laura Atkins is being seen in follow up after being seen in consultation for management of currently uncontrolled symptomatic diabetes requested by  Fayrene Helper, MD.   Past Medical History:  Diagnosis Date   Alpha thalassemia trait 01/26/2010   02/2012: Nl CBC ex H&H-10.7/34.8, MCV-69    Anemia    Anxiety    Anxiety and depression    Cellulitis 05/09/2017   Depression    Diabetes mellitus    Foot pain, right 04/30/2013   GERD (gastroesophageal reflux disease)    Headache(784.0)    Hyperlipidemia    Hypertension    Iron deficiency 01/21/2017   Microcytic anemia 01/26/2010   02/2012: Nl CBC ex H&H-10.7/34.8, MCV-69    NECK PAIN, CHRONIC 10/21/2008   +chronic back pain     Obesity    Obstructive sleep apnea    Osteoarthritis    Left knee; right shoulder; chronic neck and back pain   Pruritus    PVD (peripheral vascular disease) ([place]) 01/28/2014   Seizures (HCC)    Shoulder pain, right 04/14/2015   Tremor    This started months ago after her seizure progressing to very poor hand writing   Urinary incontinence    UTI (urinary tract infection) 01/18/2013    Past Surgical History:  Procedure Laterality Date   ABDOMINAL HYSTERECTOMY     BREAST EXCISIONAL BIOPSY     Left; cyst   CATARACT EXTRACTION Right    12/2017   CATARACT EXTRACTION W/ INTRAOCULAR LENS IMPLANT Left 09/07/2013   CHOLECYSTECTOMY     COLONOSCOPY     COLONOSCOPY N/A 07/20/2015   Procedure: COLONOSCOPY;  Surgeon: Rogene Houston, MD;  Location: [facility];  Service: Endoscopy;  Laterality: N/A;  930   EYE SURGERY Left 09/07/2013   cataract    Social History   Socioeconomic History   Marital status: Married    Spouse name: saunders   Number of children: 5   Years of education: 12    Highest education level: Not on file  Occupational History   Occupation: Disabled     Employer: RETIRED  Tobacco Use   Smoking status: Never    Passive exposure: Past   Smokeless tobacco: Never  Vaping Use   Vaping Use: Never used  Substance and Sexual Activity   Alcohol use: No    Alcohol/week: 0.0 standard drinks of alcohol   Drug use: No   Sexual activity: Yes    Birth control/protection: Surgical  Other Topics Concern   Not on file  Social History Narrative   07/27/20 Patient lives at home with her husband [name] and dgtr- [name].  Patient is retired.    Right handed.    Five Children.    Caffeine- 2 daily   Social Determinants of Health   Financial Resource Strain: Low Risk  (04/18/2022)   Overall Financial Resource Strain (CARDIA)    Difficulty of Paying Living Expenses: Not hard at all  Food Insecurity: No Food Insecurity (04/18/2022)   Hunger Vital  Sign    Worried About Charity fundraiser in the Last Year: Never true    [place] in the Last Year: Never true  Transportation Needs: No Transportation Needs (04/18/2022)   PRAPARE - Hydrologist (Medical): No    Lack of Transportation (Non-Medical): No  Physical Activity: Inactive (04/18/2022)   Exercise Vital Sign    Days of Exercise per Week: 0 days    Minutes of Exercise per Session: 0 min  Stress: No Stress Concern Present (04/18/2022)   [place]    Feeling of Stress : Not at all  Social Connections: Moderately Integrated (04/18/2022)   Social Connection and Isolation Panel [NHANES]    Frequency of Communication with Friends and Family: More than three times a week    Frequency of Social Gatherings with Friends and Family: More than three times a week    Attends Religious Services: More than 4 times per year    Active Member of [employer] or Organizations: No    Attends Archivist Meetings: Never     Marital Status: Married    Family History  Problem Relation Age of Onset   Lung cancer Mother 1   Kidney disease Father    Diabetes Sister    Keloids Brother    ADD / ADHD Grandchild    Bipolar disorder Grandchild    Bipolar disorder Daughter    Seizures Daughter    Heart disease Daughter    Kidney disease Son    Neuropathy Son    Kidney disease Son    Edema Daughter    Breast cancer Daughter 9   Allergies Daughter    Alcohol abuse Neg Hx    Drug abuse Neg Hx     Outpatient Encounter Medications as of 09/13/2022  Medication Sig   Accu-Chek FastClix Lancets MISC 1 each by Does not apply route 4 (four) times daily. Use to monitor glucose levels 4 times daily; E11.65   Alcohol Swabs (B-D SINGLE USE SWABS REGULAR) PADS 1 each by Does not apply route 4 (four) times daily. Use to prep site for glucose monitoring 4 times daily; E11.65   amLODipine (NORVASC) 5 MG tablet Take 1.5 tablets (7.5 mg total) by mouth daily.   aspirin 81 MG tablet Take 81 mg by mouth daily.   atorvastatin (LIPITOR) 40 MG tablet TAKE 1 TABLET BY MOUTH EVERY DAY   benazepril (LOTENSIN) 20 MG tablet TAKE 1 TABLET BY MOUTH EVERY DAY   Blood Glucose Monitoring Suppl (ACCU-CHEK GUIDE ME) w/Device KIT 1 each by Does not apply route 4 (four) times daily. Use to monitor glucose levels 4 times daily; E11.65   buPROPion (WELLBUTRIN XL) 150 MG 24 hr tablet Take 1 tablet (150 mg total) by mouth every morning.   Cholecalciferol 125 MCG (5000 UT) TABS Take 5,000 Units by mouth daily.   dicyclomine (BENTYL) 10 MG capsule Take 1 capsule (10 mg total) by mouth every 12 (twelve) hours as needed for spasms.   diphenhydrAMINE (BENADRYL) 25 MG tablet Take 25 mg by mouth at bedtime.   donepezil (ARICEPT) 5 MG tablet TAKE 1 TABLET BY MOUTH AT BEDTIME   gabapentin (NEURONTIN) 400 MG capsule TAKE ONE CAPSULE BY MOUTH EVERY MORNING and TAKE ONE CAPSULE AT NOON and TAKE TWO CAPSULES AT BEDTIME   glucose blood (ACCU-CHEK GUIDE) test  strip USE TO CHECK BLOOD SUGAR FOUR TIMES DAILY   glucose blood test  strip 1 each by Other route in the morning, at noon, in the evening, and at bedtime. Use as instructed qid. E11.65   insulin degludec (TRESIBA FLEXTOUCH) 200 UNIT/ML FlexTouch Pen Inject 12 Units into the skin at bedtime.   Insulin Pen Needle (EASY TOUCH PEN NEEDLES) 31G X 5 MM MISC USE TO INJECT INSULIN ONCE DAILY   Insulin Syringe-Needle U-100 (INSULIN SYRINGE 1CC/31GX5/16") 31G X 5/16" 1 ML MISC 1 each by Does not apply route daily. Use to inject insulin daily; E11.65   Multiple Vitamin (MULTIVITAMIN) capsule Take 1 capsule by mouth daily.   NONFORMULARY OR COMPOUNDED ITEM Apothecary hemorrhoid cream. Apply rectally up to qid as directed.   omeprazole (PRILOSEC) 20 MG capsule TAKE ONE CAPSULE BY MOUTH EVERY DAY   sertraline (ZOLOFT) 100 MG tablet TAKE 1 TABLET BY MOUTH DAILY   torsemide (DEMADEX) 20 MG tablet TAKE 2 TABLETS BY MOUTH EVERY DAY and additionally TAKE 1 TABLET EVERY OTHER DAY   traZODone (DESYREL) 50 MG tablet Take half tablet by mouth at bedtime for sleep   UNABLE TO FIND Walker x 1  DX unsteady gait, osteoarthritis of left knee   UNABLE TO FIND Elevated [place] Seat x 1 DX: unsteady gait, back pain, osteoarthritis of left knee   UNABLE TO FIND Standing upright walker x 1  DX M54.40, M19.90   UNABLE TO FIND Incontinence pads and supplies   UNABLE TO FIND Rollator Walker   UNABLE TO FIND XL Tranquility Briefs for incontinence.   UNABLE TO FIND Premium overnight disposible absorbant underwear XL UPC- [id number]   No facility-administered encounter medications on file as of 09/13/2022.    ALLERGIES: Allergies  Allergen Reactions   Penicillins Shortness Of Breath, Itching and Rash   Prednisone Shortness Of Breath, Itching and Rash   Propoxyphene N-Acetaminophen Itching and Nausea And Vomiting   Sulfa Antibiotics Itching and Nausea And Vomiting    VACCINATION STATUS: Immunization History   Administered Date(s) Administered   Fluad Quad(high Dose 65+) 05/20/2019, 06/22/2022   H1N1 07/15/2008   Influenza Split 06/17/2012   Influenza Whole 07/03/2007, 06/17/2009, 06/07/2011   Influenza, High Dose Seasonal PF 07/17/2018   Influenza,inj,Quad PF,6+ Mos 07/13/2013, 09/06/2014, 08/10/2015, 05/23/2016, 05/22/2017   Pneumococcal Conjugate-13 05/03/2014   Pneumococcal Polysaccharide-23 09/08/2010   Td 09/08/2010   Tdap 07/17/2018   Unspecified SARS-COV-2 Vaccination 09/25/2019, 10/26/2019    Diabetes She presents for her follow-up diabetic visit. She has type 2 diabetes mellitus. Onset time: poor historian- was diagnosed in her early 8s. Her disease course has been stable. There are no hypoglycemic associated symptoms. Pertinent negatives for diabetes include no blurred vision, no fatigue, no polydipsia and no polyuria. There are no hypoglycemic complications. Symptoms are stable. Diabetic complications include a CVA, nephropathy, peripheral neuropathy and PVD. [facility] daughter reports CVA in past) Risk factors for coronary artery disease include diabetes mellitus, dyslipidemia, family history, hypertension, obesity, post-menopausal and sedentary lifestyle. Current diabetic treatment includes insulin injections. She is compliant with treatment most of the time. Her weight is fluctuating minimally. She is following a generally healthy diet. When asked about meal planning, she reported none. She has not had a previous visit with a dietitian. She rarely participates in exercise. Her home blood glucose trend is fluctuating minimally. Her breakfast blood glucose range is generally 90-110 mg/dl. (She presents today, accompanied by her daughter, with her meter and logs showing mostly at goal glycemic profile.  Her POCT A1c today is 6.9%, unchanged from previous visit.  Analysis of her  meter shows 7-day average of 100, 14-day average of 108, 30-day average of 108, 90-day average of 116.  There is no  hypoglycemia documented or reported.) An ACE inhibitor/angiotensin II receptor blocker is being taken. She sees a podiatrist.Eye exam is current.  Hypertension This is a chronic problem. The current episode started more than 1 year ago. The problem has been resolved since onset. The problem is controlled. Pertinent negatives include no blurred vision. There are no associated agents to hypertension. Risk factors for coronary artery disease include diabetes mellitus, dyslipidemia, family history, obesity, post-menopausal state and sedentary lifestyle. Past treatments include calcium channel blockers and ACE inhibitors. The current treatment provides moderate improvement. Compliance problems include exercise and diet.  Hypertensive end-organ damage includes kidney disease, CVA and PVD. Identifiable causes of hypertension include chronic renal disease and sleep apnea.  Hyperlipidemia This is a chronic problem. The current episode started more than 1 year ago. The problem is controlled. Recent lipid tests were reviewed and are normal. Exacerbating diseases include chronic renal disease, diabetes and obesity. There are no known factors aggravating her hyperlipidemia. Current antihyperlipidemic treatment includes statins. The current treatment provides moderate improvement of lipids. Compliance problems include adherence to diet and adherence to exercise.  Risk factors for coronary artery disease include diabetes mellitus, dyslipidemia, family history, hypertension, obesity, a sedentary lifestyle and post-menopausal.     Review of systems  Constitutional: + Minimally fluctuating body weight, Body mass index is 39.53 kg/m., + fatigue, no subjective hyperthermia, no subjective hypothermia Eyes: no blurry vision, no xerophthalmia ENT: no sore throat, no nodules palpated in throat, no dysphagia/odynophagia, no hoarseness Cardiovascular: no chest pain, no shortness of breath, no palpitations, + intermittent leg  swelling Respiratory: no cough, no shortness of breath Gastrointestinal: no nausea/vomiting/diarrhea Genitourinary: + polyuria and incontinence Musculoskeletal: Hx chronic pain; walks with walker Skin: no rashes, no hyperemia Neurological: no tremors, no numbness, no tingling, no dizziness Psychiatric: no depression, no anxiety  Objective:     BP 130/86 (BP Location: Right Arm, Patient Position: Sitting, Cuff Size: Normal)   Pulse 76   Ht _0  (1.727 m)   Wt 260 lb (117.9 kg)   BMI 39.53 kg/m   Wt Readings from Last 3 Encounters:  09/13/22 260 lb (117.9 kg)  08/22/22 261 lb 9.6 oz (118.7 kg)  08/14/22 254 lb (115.2 kg)     BP Readings from Last 3 Encounters:  09/13/22 130/86  09/06/22 (!) 140/78  08/23/22 (!) 148/47     Physical Exam- Limited  Constitutional:  Body mass index is 39.08 kg/m. , not in acute distress, normal state of mind Eyes:  EOMI, no exophthalmos Musculoskeletal: no gross deformities, strength intact in all four extremities, no gross restriction of joint movements, walks with walker Skin:  no rashes, no hyperemia Neurological: no tremor with outstretched hands    CMP ( most recent) CMP     Component Value Date/Time   NA 140 05/01/2022 1502   NA 146 (H) 10/24/2021 1218   K 4.1 05/01/2022 1502   CL 103 05/01/2022 1502   CO2 29 05/01/2022 1502   GLUCOSE 109 (H) 05/01/2022 1502   BUN 49 (H) 05/01/2022 1502   BUN 42 (H) 10/24/2021 1218   CREATININE 1.71 (H) 05/01/2022 1502   CREATININE 2.02 (H) 03/22/2022 1534   CALCIUM 9.1 05/01/2022 1502   PROT 7.4 03/22/2022 1534   PROT 7.0 10/24/2021 1218   ALBUMIN 3.8 05/01/2022 1502   ALBUMIN 4.3 10/24/2021 1218   AST 17  03/22/2022 1534   ALT 20 03/22/2022 1534   ALT 16 03/22/2022 1534   ALKPHOS 111 10/24/2021 1218   BILITOT 0.4 03/22/2022 1534   BILITOT 0.2 10/24/2021 1218   GFRNONAA 30 (L) 05/01/2022 1502   GFRNONAA 40 (L) 12/01/2019 1137   GFRAA 55 (L) 06/03/2020 0210   GFRAA 46 (L)  12/01/2019 1137     Diabetic Labs (most recent): Lab Results  Component Value Date   HGBA1C 6.9 09/13/2022   HGBA1C 6.9 05/10/2022   HGBA1C 7.8 (A) 10/24/2021   MICROALBUR 33.9 11/26/2018   MICROALBUR 20.9 (H) 10/15/2016   MICROALBUR 0.8 03/14/2015     Lipid Panel ( most recent) Lipid Panel     Component Value Date/Time   CHOL 153 10/24/2021 1218   TRIG 136 10/24/2021 1218   HDL 60 10/24/2021 1218   CHOLHDL 2.6 10/24/2021 1218   CHOLHDL 2.6 12/01/2019 1137   VLDL 16 05/14/2017 1039   LDLCALC 70 10/24/2021 1218   LDLCALC 72 12/01/2019 1137   LABVLDL 23 10/24/2021 1218      Lab Results  Component Value Date   TSH 2.750 10/24/2021   TSH 1.129 05/27/2020   TSH 2.24 12/01/2019   TSH 1.12 11/26/2018   TSH 2.01 05/14/2017   TSH 2.13 02/21/2016   TSH 0.989 08/18/2014   TSH 0.520 01/23/2012   TSH 1.315 09/08/2010   TSH 1.674 05/16/2010           Assessment & Plan:   1) Controlled Type 2 Diabetes with CKD stage 3b-  She presents today, accompanied by her daughter, with her meter and logs showing mostly at goal glycemic profile.  Her POCT A1c today is 6.9%, unchanged from previous visit.  Analysis of her meter shows 7-day average of 100, 14-day average of 108, 30-day average of 108, 90-day average of 116.  There is no hypoglycemia documented or reported.  - Laura Atkins has currently uncontrolled symptomatic type 2 DM since 81 years of age.   -Recent labs reviewed.  She has seen Dr. Theador Hawthorne in the past for CKD.  - I had a long discussion with her about the progressive nature of diabetes and the pathology behind its complications.  -her diabetes is complicated by CKD, CVA, neuropathy, PVD and she remains at a high risk for more acute and chronic complications which include CAD, CVA, CKD, retinopathy, and neuropathy. These are all discussed in detail with her.  - Nutritional counseling repeated at each appointment due to patients tendency to fall back in to old  habits.  - The patient admits there is a room for improvement in their diet and drink choices. -  Suggestion is made for the patient to avoid simple carbohydrates from their diet including Cakes, Sweet Desserts / Pastries, Ice Cream, Soda (diet and regular), Sweet Tea, Candies, Chips, Cookies, Sweet Pastries, Store Bought Juices, Alcohol in Excess of 1-2 drinks a day, Artificial Sweeteners, Coffee Creamer, and "Sugar-free" Products. This will help patient to have stable blood glucose profile and potentially avoid unintended weight gain.   - I encouraged the patient to switch to unprocessed or minimally processed complex starch and increased protein intake (animal or plant source), fruits, and vegetables.   - Patient is advised to stick to a routine mealtimes to eat 3 meals a day and avoid unnecessary snacks (to snack only to correct hypoglycemia).  - I have approached her with the following individualized plan to manage her diabetes and patient agrees:   -Given her age  and comorbidities, avoiding hypoglycemia will be the number 1 priority in her diabetes management.  An A1c of 7-7.5 will be acceptable for her.  Adjustments need to be made slowly in her case to avoid inadvertent hypoglycemia.  -She has done well with switching to ultra-long acting insulin once daily.  She is advised to continue her Tresiba 12 units SQ nightly.  She will do most benefit to avoid processed foods and concentrated sweets.  -she is encouraged to continue monitoring blood glucose twice daily, before breakfast and before bed, and to call the clinic if she has readings less than 80 or above 300 for 3 tests in a row.    - she is warned not to take insulin without proper monitoring per orders. - Adjustment parameters are given to her for hypo and hyperglycemia in writing.  - she is not a candidate for Metformin or SGLT2i due to concurrent renal insufficiency.  - she will be considered for incretin therapy as appropriate  next visit.  - Specific targets for  A1c; LDL, HDL, and Triglycerides were discussed with the patient.  2) Blood Pressure /Hypertension:  her blood pressure is controlled to target.   she is advised to continue her current medications including Norvasc 5 mg p.o. daily with breakfast, Benazepril 20 mg po daily and Torsemide 40 mg and 60 mg daily on alternating days.  3) Lipids/Hyperlipidemia:    Review of her recent lipid panel from 10/24/21 showed controlled LDL at 70 .  she is advised to continue Lipitor 40 mg daily at bedtime.  Side effects and precautions discussed with her.  4)  Weight/Diet:  her Body mass index is 39.53 kg/m.  -  clearly complicating her diabetes care.   she is a candidate for weight loss. I discussed with her the fact that loss of 5 - 10% of her  current body weight will have the most impact on her diabetes management.  Exercise, and detailed carbohydrates information provided  -  detailed on discharge instructions.  5) Chronic Care/Health Maintenance: -she is on ACEI/ARB and Statin medications and is encouraged to initiate and continue to follow up with Ophthalmology, Dentist, Podiatrist at least yearly or according to recommendations, and advised to stay away from smoking. I have recommended yearly flu vaccine and pneumonia vaccine at least every 5 years; moderate intensity exercise for up to 150 minutes weekly; and sleep for at least 7 hours a day.  - she is advised to maintain close follow up with Fayrene Helper, MD for primary care needs, as well as her other providers for optimal and coordinated care.     I spent 38 minutes in the care of the patient today including review of labs from [facility], Lipids, Thyroid Function, Hematology (current and previous including abstractions from other facilities); face-to-face time discussing  her blood glucose readings/logs, discussing hypoglycemia and hyperglycemia episodes and symptoms, medications doses, her options of short  and long term treatment based on the latest standards of care / guidelines;  discussion about incorporating lifestyle medicine;  and documenting the encounter. Risk reduction counseling performed per USPSTF guidelines to reduce obesity and cardiovascular risk factors.     Please refer to Patient Instructions for Blood Glucose Monitoring and Insulin/Medications Dosing Guide"  in media tab for additional information. Please  also refer to " Patient Self Inventory" in the Media  tab for reviewed elements of pertinent patient history.  [name] participated in the discussions, expressed understanding, and voiced agreement with the  above plans.  All questions were answered to her satisfaction. she is encouraged to contact clinic should she have any questions or concerns prior to her return visit.     Follow up plan: - Return in about 4 months (around 01/13/2023) for Diabetes F/U with A1c in office, No previsit labs, Bring meter and logs.   Rayetta Pigg, [facility] [address] [place], [place] [address] Phone: [phone] Fax: [phone]  09/13/2022, 11:13 AM

## 2022-09-20 ENCOUNTER — Encounter (HOSPITAL_COMMUNITY)
Admission: RE | Admit: 2022-09-20 | Discharge: 2022-09-20 | Disposition: A | Discharge status: Home / Self Care | Payer: Medicare PPO | Source: Ambulatory Visit | Attending: Nephrology | Admitting: Nephrology

## 2022-09-20 VITALS — BP 156/68 | HR 68 | Temp 98.6°F | Resp 18

## 2022-09-20 DIAGNOSIS — D631 Anemia in chronic kidney disease: Secondary | ICD-10-CM | POA: Diagnosis not present

## 2022-09-20 DIAGNOSIS — N184 Chronic kidney disease, stage 4 (severe): Secondary | ICD-10-CM | POA: Diagnosis not present

## 2022-09-20 LAB — POCT HEMOGLOBIN-HEMACUE: Hemoglobin: 8.1 g/dL — ABNORMAL LOW (ref 12.0–15.0)

## 2022-09-20 MED ORDER — EPOETIN ALFA-EPBX 10000 UNIT/ML IJ SOLN
10000.0000 [IU] | Freq: Once | INTRAMUSCULAR | Status: AC
  Administered 2022-09-20: 10000 [IU] via SUBCUTANEOUS

## 2022-09-20 NOTE — Progress Notes (Signed)
Diagnosis: Anemia in Chronic Kidney Disease  Provider:  Manpreet Bhutani MD  Procedure: Injection  Retacrit (epoetin alfa-epbx), Dose: 10000 Units, Site: subcutaneous, Number of injections: 1  Hgb 8.1  Post Care: Patient declined observation  Discharge: Condition: Good, Destination: Home . AVS provided to patient.   Performed by:  Binnie Kand, RN

## 2022-09-25 ENCOUNTER — Ambulatory Visit: Payer: Medicare PPO | Admitting: Podiatry

## 2022-09-27 ENCOUNTER — Other Ambulatory Visit: Payer: Self-pay | Admitting: Family Medicine

## 2022-09-27 ENCOUNTER — Other Ambulatory Visit (HOSPITAL_COMMUNITY): Payer: Self-pay | Admitting: Psychiatry

## 2022-10-04 ENCOUNTER — Encounter (HOSPITAL_COMMUNITY)
Admission: RE | Admit: 2022-10-04 | Discharge: 2022-10-04 | Disposition: A | Discharge status: Home / Self Care | Payer: Medicare PPO | Source: Ambulatory Visit | Attending: Nephrology | Admitting: Nephrology

## 2022-10-04 VITALS — BP 127/56 | HR 72 | Temp 98.3°F | Resp 20

## 2022-10-04 DIAGNOSIS — D631 Anemia in chronic kidney disease: Secondary | ICD-10-CM | POA: Diagnosis not present

## 2022-10-04 DIAGNOSIS — N184 Chronic kidney disease, stage 4 (severe): Secondary | ICD-10-CM | POA: Diagnosis not present

## 2022-10-04 MED ORDER — EPOETIN ALFA-EPBX 10000 UNIT/ML IJ SOLN
10000.0000 [IU] | Freq: Once | INTRAMUSCULAR | Status: AC
  Administered 2022-10-04: 10000 [IU] via SUBCUTANEOUS
  Filled 2022-10-04: qty 1

## 2022-10-04 NOTE — Progress Notes (Signed)
  Diagnosis: Anemia in Chronic Kidney Disease   Provider:  Manpreet Bhutani MD   Procedure: Injection   Retacrit (epoetin alfa-epbx), Dose: 10000 Units, Site: subcutaneous, Number of injections: 1   Hgb 8.6   Post Care: Patient declined observation   Discharge: Condition: Good, Destination: Home . AVS provided to patient.    Performed by:  Azzie Almas, RN

## 2022-10-04 NOTE — Addendum Note (Signed)
Encounter addended by: Samina Weekes E, RN on: 10/04/2022 4:23 PM  Actions taken: Therapy plan modified

## 2022-10-05 ENCOUNTER — Other Ambulatory Visit: Payer: Self-pay | Admitting: Family Medicine

## 2022-10-05 LAB — POCT HEMOGLOBIN-HEMACUE: Hemoglobin: 8.6 g/dL — ABNORMAL LOW (ref 12.0–15.0)

## 2022-10-18 ENCOUNTER — Encounter (HOSPITAL_COMMUNITY)
Admission: RE | Admit: 2022-10-18 | Discharge: 2022-10-18 | Disposition: A | Discharge status: Home / Self Care | Payer: Medicare PPO | Source: Ambulatory Visit | Attending: Nephrology | Admitting: Nephrology

## 2022-10-18 VITALS — BP 140/62 | HR 68 | Temp 98.4°F | Resp 16

## 2022-10-18 DIAGNOSIS — N184 Chronic kidney disease, stage 4 (severe): Secondary | ICD-10-CM | POA: Diagnosis not present

## 2022-10-18 DIAGNOSIS — D631 Anemia in chronic kidney disease: Secondary | ICD-10-CM | POA: Diagnosis not present

## 2022-10-18 LAB — POCT HEMOGLOBIN-HEMACUE: Hemoglobin: 8.7 g/dL — ABNORMAL LOW (ref 12.0–15.0)

## 2022-10-18 MED ORDER — EPOETIN ALFA-EPBX 10000 UNIT/ML IJ SOLN
10000.0000 [IU] | Freq: Once | INTRAMUSCULAR | Status: AC
  Administered 2022-10-18: 10000 [IU] via SUBCUTANEOUS

## 2022-10-18 NOTE — Progress Notes (Signed)
Diagnosis: Anemia in Chronic Kidney Disease  Provider:  Manpreet Bhutani MD  Procedure: Injection  Retacrit (epoetin alfa-epbx), Dose: 10000 Units, Site: subcutaneous, Number of injections: 1  Hgb 8.7  Post Care: Patient declined observation  Discharge: Condition: Good, Destination: Home . AVS provided to patient.   Performed by:  Baxter Hire, RN

## 2022-10-19 DIAGNOSIS — D638 Anemia in other chronic diseases classified elsewhere: Secondary | ICD-10-CM | POA: Diagnosis not present

## 2022-10-19 DIAGNOSIS — N189 Chronic kidney disease, unspecified: Secondary | ICD-10-CM | POA: Diagnosis not present

## 2022-10-19 DIAGNOSIS — E1122 Type 2 diabetes mellitus with diabetic chronic kidney disease: Secondary | ICD-10-CM | POA: Diagnosis not present

## 2022-10-19 DIAGNOSIS — R600 Localized edema: Secondary | ICD-10-CM | POA: Diagnosis not present

## 2022-10-19 DIAGNOSIS — E875 Hyperkalemia: Secondary | ICD-10-CM | POA: Diagnosis not present

## 2022-10-19 DIAGNOSIS — D560 Alpha thalassemia: Secondary | ICD-10-CM | POA: Diagnosis not present

## 2022-10-19 DIAGNOSIS — I129 Hypertensive chronic kidney disease with stage 1 through stage 4 chronic kidney disease, or unspecified chronic kidney disease: Secondary | ICD-10-CM | POA: Diagnosis not present

## 2022-10-25 ENCOUNTER — Other Ambulatory Visit: Payer: Self-pay | Admitting: Family Medicine

## 2022-10-25 ENCOUNTER — Other Ambulatory Visit: Payer: Self-pay | Admitting: Podiatry

## 2022-10-26 ENCOUNTER — Encounter: Payer: Medicare PPO | Admitting: Family Medicine

## 2022-10-29 ENCOUNTER — Ambulatory Visit (INDEPENDENT_AMBULATORY_CARE_PROVIDER_SITE_OTHER): Payer: Medicare PPO | Admitting: Gastroenterology

## 2022-10-29 ENCOUNTER — Ambulatory Visit: Payer: Medicare PPO

## 2022-10-29 ENCOUNTER — Encounter (INDEPENDENT_AMBULATORY_CARE_PROVIDER_SITE_OTHER): Payer: Self-pay | Admitting: Gastroenterology

## 2022-10-29 VITALS — BP 146/79 | HR 73 | Temp 97.8°F | Ht 68.0 in | Wt 261.6 lb

## 2022-10-29 DIAGNOSIS — N189 Chronic kidney disease, unspecified: Secondary | ICD-10-CM | POA: Diagnosis not present

## 2022-10-29 DIAGNOSIS — K6289 Other specified diseases of anus and rectum: Secondary | ICD-10-CM

## 2022-10-29 DIAGNOSIS — E875 Hyperkalemia: Secondary | ICD-10-CM | POA: Diagnosis not present

## 2022-10-29 DIAGNOSIS — E1122 Type 2 diabetes mellitus with diabetic chronic kidney disease: Secondary | ICD-10-CM | POA: Diagnosis not present

## 2022-10-29 DIAGNOSIS — I129 Hypertensive chronic kidney disease with stage 1 through stage 4 chronic kidney disease, or unspecified chronic kidney disease: Secondary | ICD-10-CM | POA: Diagnosis not present

## 2022-10-29 DIAGNOSIS — R32 Unspecified urinary incontinence: Secondary | ICD-10-CM | POA: Diagnosis not present

## 2022-10-29 DIAGNOSIS — D560 Alpha thalassemia: Secondary | ICD-10-CM | POA: Diagnosis not present

## 2022-10-29 DIAGNOSIS — D649 Anemia, unspecified: Secondary | ICD-10-CM | POA: Diagnosis not present

## 2022-10-29 DIAGNOSIS — D638 Anemia in other chronic diseases classified elsewhere: Secondary | ICD-10-CM | POA: Diagnosis not present

## 2022-10-29 DIAGNOSIS — R600 Localized edema: Secondary | ICD-10-CM | POA: Diagnosis not present

## 2022-10-29 MED ORDER — TRANQUILITY UNDERGARMENTS MISC: 1.0000 | Freq: Every day | 3 refills | Status: DC

## 2022-10-29 NOTE — Patient Instructions (Addendum)
Continue using rectal ointment for rectal pain/hemorrhoids Follow up anemia and hemoglobin level with PCP The patient was found to have elevated blood pressure when vital signs were checked in the office. The blood pressure was rechecked by the nursing staff and it was found be persistently elevated >140/90 mmHg. I personally advised to the patient to follow up closely with PCP for hypertension control.

## 2022-10-29 NOTE — Progress Notes (Unsigned)
Laura Atkins, M.D. Gastroenterology & Hepatology [facility] [address] [place], [place] [address]  Primary Care Physician: Fayrene Helper, MD [address], [place] [address]  I will communicate my assessment and recommendations to the referring MD via EMR.  Problems: Multifactorial anemia with mild IDA  History of Present Illness: Laura Atkins is a 82 y.o. female  female with past medical history of dementia, alpha thalassemia trait, anemia, DM, GERD, HLD, HTN, iron deficiency, OSA, PVD, who presents for follow up of IDA.  The patient was last seen on 07/26/2022. At that time, the patient was advised to stop dicyclomine and take hemorrhoidal compound cream.  Family wanted to hold off on performing endoscopic evaluation as the patient has dementia and had difficulty taking the prep.  Patient reports feeling well.  She comes to the clinic with her daughter.  Denies having any complaints. She still presents some abdominal pain occasionally, which she describes as abdominal discomfort intermittently but reports that this is not severe. The patient denies having any nausea, vomiting, fever, chills, melena, hematemesis, abdominal distention, diarrhea, jaundice, pruritus or weight loss. Sometimes can have some pain int he rectum with rectal ointment. Has very occasional fresh blood so she avoids to strain.  Most recent hemoglobin on 10/18/2022 was 8.7. Iron stores have been normal for the last 2 years.  Last Colonoscopy:07/20/15  Prep suboptimal. She had a lot of thick liquid stool scattered throughout the colon. Cecal landmarks well seen. 25 mm flat ulcerated lesion noted next to ileocecal valve. Multiple biopsies taken. Path only showed ulcerated mucosa. Mucosa of rest of the colon was normal. Normal rectal mucosa. Small hemorrhoids below the dentate line.   Since 2016, she has had at least 3 CT scan with IV  contrast(last April 2023) that have been negative for malignancy in the colon.   Last Endoscopy:never  Past Medical History: Past Medical History:  Diagnosis Date   Alpha thalassemia trait 01/26/2010   02/2012: Nl CBC ex H&H-10.7/34.8, MCV-69    Anemia    Anxiety    Anxiety and depression    Cellulitis 05/09/2017   Depression    Diabetes mellitus    Foot pain, right 04/30/2013   GERD (gastroesophageal reflux disease)    Headache(784.0)    Hyperlipidemia    Hypertension    Iron deficiency 01/21/2017   Microcytic anemia 01/26/2010   02/2012: Nl CBC ex H&H-10.7/34.8, MCV-69    NECK PAIN, CHRONIC 10/21/2008   +chronic back pain     Obesity    Obstructive sleep apnea    Osteoarthritis    Left knee; right shoulder; chronic neck and back pain   Pruritus    PVD (peripheral vascular disease) ([place]) 01/28/2014   Seizures (HCC)    Shoulder pain, right 04/14/2015   Tremor    This started months ago after her seizure progressing to very poor hand writing   Urinary incontinence    UTI (urinary tract infection) 01/18/2013    Past Surgical History: Past Surgical History:  Procedure Laterality Date   ABDOMINAL HYSTERECTOMY     BREAST EXCISIONAL BIOPSY     Left; cyst   CATARACT EXTRACTION Right    12/2017   CATARACT EXTRACTION W/ INTRAOCULAR LENS IMPLANT Left 09/07/2013   CHOLECYSTECTOMY     COLONOSCOPY     COLONOSCOPY N/A 07/20/2015   Procedure: COLONOSCOPY;  Surgeon: Rogene Houston, MD;  [facility];  Service: Endoscopy;  Laterality: N/A;  35   EYE SURGERY Left 09/07/2013   cataract    Family History: Family History  Problem Relation Age of Onset   Lung cancer Mother 5   Kidney disease Father    Diabetes Sister    Keloids Brother    ADD / ADHD Grandchild    Bipolar disorder Grandchild    Bipolar disorder Daughter    Seizures Daughter    Heart disease Daughter    Kidney disease Son    Neuropathy Son    Kidney disease Son    Edema Daughter    Breast  cancer Daughter 45   Allergies Daughter    Alcohol abuse Neg Hx    Drug abuse Neg Hx     Social History: Social History   Tobacco Use  Smoking Status Never   Passive exposure: Past  Smokeless Tobacco Never   Social History   Substance and Sexual Activity  Alcohol Use No   Alcohol/week: 0.0 standard drinks of alcohol   Social History   Substance and Sexual Activity  Drug Use No    Allergies: Allergies  Allergen Reactions   Penicillins Shortness Of Breath, Itching and Rash   Prednisone Shortness Of Breath, Itching and Rash   Propoxyphene N-Acetaminophen Itching and Nausea And Vomiting   Sulfa Antibiotics Itching and Nausea And Vomiting    Medications: Current Outpatient Medications  Medication Sig Dispense Refill   Accu-Chek FastClix Lancets MISC 1 each by Does not apply route 4 (four) times daily. Use to monitor glucose levels 4 times daily; E11.65 306 each 2   Alcohol Swabs (B-D SINGLE USE SWABS REGULAR) PADS 1 each by Does not apply route 4 (four) times daily. Use to prep site for glucose monitoring 4 times daily; E11.65 300 each 2   amLODipine (NORVASC) 5 MG tablet Take 1.5 tablets (7.5 mg total) by mouth daily. 135 tablet 1   aspirin 81 MG tablet Take 81 mg by mouth daily.     atorvastatin (LIPITOR) 40 MG tablet TAKE 1 TABLET BY MOUTH EVERY DAY 30 tablet 2   benazepril (LOTENSIN) 20 MG tablet TAKE 1 TABLET BY MOUTH EVERY DAY 30 tablet 5   Blood Glucose Monitoring Suppl (ACCU-CHEK GUIDE ME) w/Device KIT 1 each by Does not apply route 4 (four) times daily. Use to monitor glucose levels 4 times daily; E11.65 1 kit 0   buPROPion (WELLBUTRIN XL) 150 MG 24 hr tablet TAKE 1 TABLET BY MOUTH EVERY MORNING 90 tablet 2   Cholecalciferol 125 MCG (5000 UT) TABS Take 5,000 Units by mouth daily.     diphenhydrAMINE (BENADRYL) 25 MG tablet Take 25 mg by mouth at bedtime.     donepezil (ARICEPT) 5 MG tablet TAKE 1 TABLET BY MOUTH AT BEDTIME 30 tablet 2   gabapentin (NEURONTIN) 400  MG capsule TAKE ONE CAPSULE BY MOUTH EVERY MORNING and TAKE ONE CAPSULE AT NOON and TAKE TWO CAPSULES AT BEDTIME 360 capsule 1   glucose blood (ACCU-CHEK GUIDE) test strip USE TO CHECK BLOOD SUGAR FOUR TIMES DAILY 200 strip 1   glucose blood test strip 1 each by Other route in the morning, at noon, in the evening, and at bedtime. Use as instructed qid. E11.65     insulin degludec (TRESIBA FLEXTOUCH) 200 UNIT/ML FlexTouch Pen Inject 12 Units into the skin at bedtime. 12 mL 3   Insulin Pen Needle (EASY TOUCH PEN NEEDLES) 31G X 5 MM MISC USE TO INJECT INSULIN ONCE DAILY 100 each 2   Insulin Syringe-Needle U-100 (  INSULIN SYRINGE 1CC/31GX5/16") 31G X 5/16" 1 ML MISC 1 each by Does not apply route daily. Use to inject insulin daily; E11.65 90 each 2   Multiple Vitamin (MULTIVITAMIN) capsule Take 1 capsule by mouth daily.     NONFORMULARY OR COMPOUNDED ITEM Apothecary hemorrhoid cream. Apply rectally up to qid as directed.     omeprazole (PRILOSEC) 20 MG capsule TAKE ONE CAPSULE BY MOUTH EVERY DAY 30 capsule 2   sertraline (ZOLOFT) 100 MG tablet TAKE 1 TABLET BY MOUTH DAILY 90 tablet 2   torsemide (DEMADEX) 20 MG tablet TAKE 2 TABLETS BY MOUTH EVERY DAY and additionally TAKE 1 TABLET EVERY OTHER DAY 225 tablet 2   traZODone (DESYREL) 50 MG tablet Take half tablet by mouth at bedtime for sleep 15 tablet 3   UNABLE TO FIND Walker x 1  DX unsteady gait, osteoarthritis of left knee 1 each 0   UNABLE TO FIND Elevated [place] Seat x 1 DX: unsteady gait, back pain, osteoarthritis of left knee 1 each 0   UNABLE TO FIND Standing upright walker x 1  DX M54.40, M19.90 1 each 0   UNABLE TO FIND Incontinence pads and supplies 1 each 1   UNABLE TO FIND Rollator Walker 1 Product 0   UNABLE TO FIND XL Tranquility Briefs for incontinence. 1 Package 3   UNABLE TO FIND Premium overnight disposible absorbant underwear XL UPC- [id number] 14 each 11   dicyclomine (BENTYL) 10 MG capsule Take 1 capsule (10 mg total) by  mouth every 12 (twelve) hours as needed for spasms. (Patient not taking: Reported on 10/29/2022) 180 capsule 1   No current facility-administered medications for this visit.    Review of Systems: GENERAL: negative for malaise, night sweats HEENT: No changes in hearing or vision, no nose bleeds or other nasal problems. NECK: Negative for lumps, goiter, pain and significant neck swelling RESPIRATORY: Negative for cough, wheezing CARDIOVASCULAR: Negative for chest pain, leg swelling, palpitations, orthopnea GI: SEE HPI MUSCULOSKELETAL: Negative for joint pain or swelling, back pain, and muscle pain. SKIN: Negative for lesions, rash PSYCH: Negative for sleep disturbance, mood disorder and recent psychosocial stressors. HEMATOLOGY Negative for prolonged bleeding, bruising easily, and swollen nodes. ENDOCRINE: Negative for cold or heat intolerance, polyuria, polydipsia and goiter. NEURO: negative for tremor, gait imbalance, syncope and seizures. The remainder of the review of systems is noncontributory.   Physical Exam: BP (!) 146/79 (BP Location: Left Arm, Patient Position: Sitting, Cuff Size: Large)   Pulse 73   Temp 97.8 F (36.6 C) (Temporal)   Ht 5\' 8"  (1.727 m)   Wt 261 lb 9.6 oz (118.7 kg)   BMI 39.78 kg/m  GENERAL: The patient is AO x3, in no acute distress. Uses wheelchair. HEENT: Head is normocephalic and atraumatic. EOMI are intact. Mouth is well hydrated and without lesions. NECK: Supple. No masses LUNGS: Clear to auscultation. No presence of rhonchi/wheezing/rales. Adequate chest expansion HEART: RRR, normal s1 and s2. ABDOMEN: Soft, nontender, no guarding, no peritoneal signs, and nondistended. BS +. No masses. EXTREMITIES: Without any cyanosis, clubbing, rash, lesions or edema. NEUROLOGIC: AOx3, no focal motor deficit. SKIN: no jaundice, no rashes  Imaging/Labs: as above  I personally reviewed and interpreted the available labs, imaging and endoscopic  files.  Impression and Plan: KALENE CUTLER is a 82 y.o. female  female with past medical history of dementia, alpha thalassemia trait, anemia, DM, GERD, HLD, HTN, iron deficiency, OSA, PVD, who presents for follow up of IDA.  Patient  has presented a chronic history of anemia has not had any recent endoscopic lesion but unfortunately she was not able to tolerate the bowel prep and this procedure has been canceled in the past.  Family and the patient are not be interested in attempting any further endoscopic evaluations. Notably, her hemoglobin has been stable and hr iron stores have not decreased for the last 2 years.  Etiology of anemia is multifactorial but seems that her chronic losses have slowed down.  She may have had some occasional episodes of rectal bleeding but these are infrequent, for which she can use rectal ointment for hemorrhoids intermittently.  The patient was found to have elevated blood pressure when vital signs were checked in the office. The blood pressure was rechecked by the nursing staff and it was found be persistently elevated >140/90 mmHg. I personally advised to the patient to follow up closely with PCP for hypertension control.  - Continue using rectal ointment for rectal pain/hemorrhoids - Follow up anemia and hemoglobin level with PCP  All questions were answered.      Laura Peppers, MD Gastroenterology and Hepatology [facility]

## 2022-11-01 ENCOUNTER — Encounter (HOSPITAL_COMMUNITY)
Admission: RE | Admit: 2022-11-01 | Discharge: 2022-11-01 | Disposition: A | Discharge status: Home / Self Care | Payer: Medicare PPO | Source: Ambulatory Visit | Attending: Nephrology | Admitting: Nephrology

## 2022-11-01 VITALS — BP 158/61 | HR 70 | Temp 98.3°F | Resp 16

## 2022-11-01 DIAGNOSIS — D631 Anemia in chronic kidney disease: Secondary | ICD-10-CM | POA: Insufficient documentation

## 2022-11-01 DIAGNOSIS — N184 Chronic kidney disease, stage 4 (severe): Secondary | ICD-10-CM | POA: Diagnosis not present

## 2022-11-01 LAB — POCT HEMOGLOBIN-HEMACUE: Hemoglobin: 8.8 g/dL — ABNORMAL LOW (ref 12.0–15.0)

## 2022-11-01 MED ORDER — EPOETIN ALFA-EPBX 10000 UNIT/ML IJ SOLN
10000.0000 [IU] | Freq: Once | INTRAMUSCULAR | Status: AC
  Administered 2022-11-01: 10000 [IU] via SUBCUTANEOUS

## 2022-11-01 NOTE — Progress Notes (Signed)
Diagnosis: Anemia in Chronic Kidney Disease  Provider:  Manpreet Bhutani MD  Procedure: Injection  Retacrit (epoetin alfa-epbx), Dose: 10000 Units, Site: subcutaneous, Number of injections: 1  Hgb 8.8  Post Care: Patient declined observation  Discharge: Condition: Good, Destination: Home . AVS Provided  Performed by:  Binnie Kand, RN

## 2022-11-14 ENCOUNTER — Encounter: Payer: Self-pay | Admitting: Family Medicine

## 2022-11-14 ENCOUNTER — Telehealth: Payer: Self-pay | Admitting: Family Medicine

## 2022-11-14 ENCOUNTER — Telehealth (INDEPENDENT_AMBULATORY_CARE_PROVIDER_SITE_OTHER): Payer: Medicare PPO | Admitting: Family Medicine

## 2022-11-14 DIAGNOSIS — F03911 Unspecified dementia, unspecified severity, with agitation: Secondary | ICD-10-CM | POA: Diagnosis not present

## 2022-11-14 DIAGNOSIS — F03C11 Unspecified dementia, severe, with agitation: Secondary | ICD-10-CM

## 2022-11-14 MED ORDER — DONEPEZIL HCL 10 MG PO TABS: 10.0000 mg | ORAL_TABLET | Freq: Every day | ORAL | 3 refills | Status: DC
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
## 2022-11-14 NOTE — Patient Instructions (Addendum)
Please keep follow  up appt next week, call if you need me sooner  Start higher treating dose of  Aricept.  I have referred you to a Neurologist to manage your dementia since increased agitation is reported, that office will be in touch with appt info  Please use your walker so you do not fall  I have asked your daughter to call the insurance  company to see if she can gett additioal help for you  at home  Thanks for choosing [facility], we consider it a privelige to serve you.

## 2022-11-15 ENCOUNTER — Encounter (HOSPITAL_COMMUNITY)
Admission: RE | Admit: 2022-11-15 | Discharge: 2022-11-15 | Disposition: A | Discharge status: Home / Self Care | Payer: Medicare PPO | Source: Ambulatory Visit | Attending: Nephrology | Admitting: Nephrology

## 2022-11-15 VITALS — BP 143/65 | HR 73 | Temp 98.6°F | Resp 18

## 2022-11-15 DIAGNOSIS — N184 Chronic kidney disease, stage 4 (severe): Secondary | ICD-10-CM

## 2022-11-15 DIAGNOSIS — D631 Anemia in chronic kidney disease: Secondary | ICD-10-CM | POA: Diagnosis not present

## 2022-11-15 LAB — POCT HEMOGLOBIN-HEMACUE: Hemoglobin: 7.9 g/dL — ABNORMAL LOW (ref 12.0–15.0)

## 2022-11-15 MED ORDER — EPOETIN ALFA-EPBX 10000 UNIT/ML IJ SOLN
10000.0000 [IU] | Freq: Once | INTRAMUSCULAR | Status: AC
  Administered 2022-11-15: 10000 [IU] via SUBCUTANEOUS
  Filled 2022-11-15: qty 1

## 2022-11-15 NOTE — Addendum Note (Signed)
Encounter addended by: Baxter Hire, RN on: 11/15/2022 4:03 PM  Actions taken: Charge Capture section accepted

## 2022-11-15 NOTE — Addendum Note (Signed)
Encounter addended by: Baxter Hire, RN on: 11/15/2022 3:47 PM  Actions taken: Therapy plan modified

## 2022-11-15 NOTE — Progress Notes (Signed)
Diagnosis: Anemia in Chronic Kidney Disease  Provider:  Manpreet Bhutani MD  Procedure: Injection  Retacrit (epoetin alfa-epbx), Dose: 10000 Units, Site: subcutaneous, Number of injections: 1  Post Care: Patient declined observation  Discharge: Condition: Good, Destination: Home . AVS Provided  Performed by:  Kennith Maes, RN       Hgb 7.9

## 2022-11-16 ENCOUNTER — Telehealth: Payer: Self-pay | Admitting: Family Medicine
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
## 2022-11-16 NOTE — Telephone Encounter (Signed)
Was just seen and med increased and referred to neurologist for worsening dememtia. Will see if can get soon appt with neuro

## 2022-11-18 ENCOUNTER — Encounter: Payer: Self-pay | Admitting: Family Medicine

## 2022-11-18 NOTE — Assessment & Plan Note (Addendum)
Increase aricept dose to 10 mg daily Refer Neurology for management, has long h/o severe depression Has dx of polyneuropathy Medication management will be challenging  Daughter to reach out to her ins to see if additional assistance can be obained through her ins for care

## 2022-11-20 ENCOUNTER — Encounter: Payer: Medicare PPO | Admitting: Family Medicine

## 2022-11-21 ENCOUNTER — Other Ambulatory Visit: Payer: Self-pay | Admitting: Family Medicine

## 2022-11-22 ENCOUNTER — Encounter: Payer: Self-pay | Admitting: Radiology

## 2022-11-29 ENCOUNTER — Encounter (HOSPITAL_COMMUNITY)
Admission: RE | Admit: 2022-11-29 | Discharge: 2022-11-29 | Disposition: A | Discharge status: Home / Self Care | Payer: Medicare PPO | Source: Ambulatory Visit | Attending: Nephrology | Admitting: Nephrology

## 2022-11-29 VITALS — BP 140/61 | HR 71 | Temp 98.1°F | Resp 20

## 2022-11-29 DIAGNOSIS — D631 Anemia in chronic kidney disease: Secondary | ICD-10-CM

## 2022-11-29 DIAGNOSIS — N184 Chronic kidney disease, stage 4 (severe): Secondary | ICD-10-CM | POA: Insufficient documentation

## 2022-11-29 LAB — POCT HEMOGLOBIN-HEMACUE: Hemoglobin: 7.8 g/dL — ABNORMAL LOW (ref 12.0–15.0)

## 2022-11-29 MED ORDER — EPOETIN ALFA-EPBX 10000 UNIT/ML IJ SOLN
10000.0000 [IU] | Freq: Once | INTRAMUSCULAR | Status: AC
  Administered 2022-11-29: 10000 [IU] via SUBCUTANEOUS
  Filled 2022-11-29: qty 1

## 2022-11-29 NOTE — Addendum Note (Signed)
Encounter addended by: Baxter Hire, RN on: 11/29/2022 3:12 PM  Actions taken: Therapy plan modified

## 2022-11-29 NOTE — Progress Notes (Signed)
Diagnosis: Anemia in Chronic Kidney Disease  Provider:  Manpreet Bhutani MD  Procedure: Injection  Retacrit (epoetin alfa-epbx), Dose: 10000 Units, Site: subcutaneous, Number of injections: 1  Hgb 7.8  Post Care: Patient declined observation  Discharge: Condition: Stable, Destination: Home . AVS Provided  Performed by:  Baxter Hire, RN

## 2022-12-12 DIAGNOSIS — E6609 Other obesity due to excess calories: Secondary | ICD-10-CM | POA: Diagnosis not present

## 2022-12-12 DIAGNOSIS — R600 Localized edema: Secondary | ICD-10-CM | POA: Diagnosis not present

## 2022-12-12 DIAGNOSIS — N189 Chronic kidney disease, unspecified: Secondary | ICD-10-CM | POA: Diagnosis not present

## 2022-12-12 DIAGNOSIS — D638 Anemia in other chronic diseases classified elsewhere: Secondary | ICD-10-CM | POA: Diagnosis not present

## 2022-12-12 DIAGNOSIS — D508 Other iron deficiency anemias: Secondary | ICD-10-CM | POA: Diagnosis not present

## 2022-12-12 DIAGNOSIS — I129 Hypertensive chronic kidney disease with stage 1 through stage 4 chronic kidney disease, or unspecified chronic kidney disease: Secondary | ICD-10-CM | POA: Diagnosis not present

## 2022-12-12 DIAGNOSIS — E11621 Type 2 diabetes mellitus with foot ulcer: Secondary | ICD-10-CM | POA: Diagnosis not present

## 2022-12-12 DIAGNOSIS — Z6841 Body Mass Index (BMI) 40.0 and over, adult: Secondary | ICD-10-CM | POA: Diagnosis not present

## 2022-12-12 DIAGNOSIS — E1122 Type 2 diabetes mellitus with diabetic chronic kidney disease: Secondary | ICD-10-CM | POA: Diagnosis not present

## 2022-12-13 ENCOUNTER — Encounter (HOSPITAL_COMMUNITY)
Admission: RE | Admit: 2022-12-13 | Discharge: 2022-12-13 | Disposition: A | Discharge status: Home / Self Care | Payer: Medicare PPO | Source: Ambulatory Visit | Attending: Nephrology | Admitting: Nephrology

## 2022-12-13 VITALS — BP 147/57 | HR 79 | Temp 99.5°F | Resp 17

## 2022-12-13 DIAGNOSIS — D631 Anemia in chronic kidney disease: Secondary | ICD-10-CM

## 2022-12-13 DIAGNOSIS — N184 Chronic kidney disease, stage 4 (severe): Secondary | ICD-10-CM | POA: Diagnosis not present

## 2022-12-13 LAB — POCT HEMOGLOBIN-HEMACUE: Hemoglobin: 7.3 g/dL — ABNORMAL LOW (ref 12.0–15.0)

## 2022-12-13 MED ORDER — EPOETIN ALFA-EPBX 10000 UNIT/ML IJ SOLN
10000.0000 [IU] | Freq: Once | INTRAMUSCULAR | Status: AC
  Administered 2022-12-13: 10000 [IU] via SUBCUTANEOUS

## 2022-12-13 NOTE — Progress Notes (Signed)
Diagnosis: Anemia in Chronic Kidney Disease  Provider:  Manpreet Bhutani MD  Procedure: Injection  Retacrit (epoetin alfa-epbx), Dose: 10000 Units, Site: subcutaneous, Number of injections: 1  HBG 7.2  Post Care: Patient declined observation  Discharge: Condition: Good, Destination: Home . AVS Provided  Performed by:  Grayland Ormond, RN

## 2022-12-17 ENCOUNTER — Ambulatory Visit (INDEPENDENT_AMBULATORY_CARE_PROVIDER_SITE_OTHER): Payer: Medicare PPO | Admitting: Podiatry

## 2022-12-17 VITALS — BP 157/49 | HR 69 | Temp 98.6°F

## 2022-12-17 DIAGNOSIS — E11621 Type 2 diabetes mellitus with foot ulcer: Secondary | ICD-10-CM | POA: Diagnosis not present

## 2022-12-17 DIAGNOSIS — M869 Osteomyelitis, unspecified: Secondary | ICD-10-CM | POA: Diagnosis not present

## 2022-12-17 DIAGNOSIS — L97522 Non-pressure chronic ulcer of other part of left foot with fat layer exposed: Secondary | ICD-10-CM | POA: Diagnosis not present

## 2022-12-17 DIAGNOSIS — L97519 Non-pressure chronic ulcer of other part of right foot with unspecified severity: Secondary | ICD-10-CM | POA: Diagnosis not present

## 2022-12-17 NOTE — Progress Notes (Signed)
Chief Complaint  Patient presents with   Diabetic Ulcer    Bilateral foot hallux ulcer, Started week ago, A1c-7.8 BG- 157(yesterday) rate of pain 8 out of 10,  bleeding and drainage, TX: keflex, clindamycin, metolazone,     Subjective:  82 y.o. female with PMHx of diabetes mellitus presenting today for acute onset of a new ulcer to the right great toe as well as preulcerative callus to the left great toe.  Patient has history of ulcers to the left great toe.  Patient is unaware how long she has had the ulcer to the right great toe.  Currently on 2 separate antibiotics of clindamycin and Keflex prescribed by her PCP.  Presenting for further treatment evaluation   Past Medical History:  Diagnosis Date   Alpha thalassemia trait 01/26/2010   02/2012: Nl CBC ex H&H-10.7/34.8, MCV-69    Anemia    Anxiety    Anxiety and depression    Cellulitis 05/09/2017   Depression    Diabetes mellitus    Foot pain, right 04/30/2013   GERD (gastroesophageal reflux disease)    Headache(784.0)    Hyperlipidemia    Hypertension    Iron deficiency 01/21/2017   Microcytic anemia 01/26/2010   02/2012: Nl CBC ex H&H-10.7/34.8, MCV-69    NECK PAIN, CHRONIC 10/21/2008   +chronic back pain     Obesity    Obstructive sleep apnea    Osteoarthritis    Left knee; right shoulder; chronic neck and back pain   Pruritus    PVD (peripheral vascular disease) ([place]) 01/28/2014   Seizures (HCC)    Shoulder pain, right 04/14/2015   Tremor    This started months ago after her seizure progressing to very poor hand writing   Urinary incontinence    UTI (urinary tract infection) 01/18/2013    Past Surgical History:  Procedure Laterality Date   ABDOMINAL HYSTERECTOMY     BREAST EXCISIONAL BIOPSY     Left; cyst   CATARACT EXTRACTION Right    12/2017   CATARACT EXTRACTION W/ INTRAOCULAR LENS IMPLANT Left 09/07/2013   CHOLECYSTECTOMY     COLONOSCOPY     COLONOSCOPY N/A 07/20/2015   Procedure: COLONOSCOPY;   Surgeon: Rogene Houston, MD;  [facility];  Service: Endoscopy;  Laterality: N/A;  930   EYE SURGERY Left 09/07/2013   cataract    Allergies  Allergen Reactions   Penicillins Shortness Of Breath, Itching and Rash   Prednisone Shortness Of Breath, Itching and Rash   Propoxyphene N-Acetaminophen Itching and Nausea And Vomiting   Sulfa Antibiotics Itching and Nausea And Vomiting     RT great toe 12/17/2022   RT great toe postdebridement 12/17/2022   LT great toe 12/17/2022  Objective/Physical Exam General: The patient is alert and oriented x3 in no acute distress.  Dermatology:  After debridement of the preulcerative callus to the left great toe there is a small area of ulcer measuring approximately 0.3 x 0.3 x 0.2 cm.  It appears very stable and superficial with a granular wound base.  There is no exposed bone muscle tendon ligament or joint.  Ulcer to the right great toe also noted with heavy extensive fibrotic tissue.  Please see above noted photo.  Strong malodor noted.  There is heavy purulence coming from the great toe.  There is unfortunately exposed bone with debridement as well.  Findings consistent with osteomyelitis of the toe  Vascular: Chronic edema noted bilateral lower extremities  VAS [place] ABI W/WO  TBI 05/02/2022 ABI Findings:  +---------+------------------+-----+---------+--------+  Right   Rt Pressure (mmHg)IndexWaveform Comment   +---------+------------------+-----+---------+--------+  Brachial 143                                       +---------+------------------+-----+---------+--------+  PTA     255               1.78 triphasic          +---------+------------------+-----+---------+--------+  DP      255               1.78 triphasic          +---------+------------------+-----+---------+--------+  Great Toe153               1.07                    +---------+------------------+-----+---------+--------+    +---------+------------------+-----+--------+-------+  Left    Lt Pressure (mmHg)IndexWaveformComment  +---------+------------------+-----+--------+-------+  Brachial 140                                     +---------+------------------+-----+--------+-------+  PTA     0                 0.00 absent           +---------+------------------+-----+--------+-------+  DP      255               1.78 biphasic         +---------+------------------+-----+--------+-------+  Great Toe131               0.92                  +---------+------------------+-----+--------+-------+   +-------+-----------+-----------+------------+------------+  ABI/TBIToday's ABIToday's TBIPrevious ABIPrevious TBI  +-------+-----------+-----------+------------+------------+  Right [place]         1.07       [place]          0.84          +-------+-----------+-----------+------------+------------+  Left  [place]         0.92       [place]          0.9           +-------+-----------+-----------+------------+------------+  Previous ABI on 03/10/19.  Cannot rule out medial calcification.    Summary:  Right: Resting right ankle-brachial index indicates noncompressible right  lower extremity arteries. The right toe-brachial index is normal.   Left: Resting left ankle-brachial index indicates noncompressible left  lower extremity arteries. The left toe-brachial index is normal.   Neurological: Light touch and protective threshold diminished bilaterally.   Musculoskeletal Exam: No pedal deformities.  No prior amputations  Assessment/plan of care: 1.  Ulcer left great toe secondary to diabetes mellitus; recurrent 2.  Acute ulcer with heavy purulence and osteomyelitis right great toe  -Discussed the concern for osteomyelitis of the right great toe with infection.  Recommend amputation.  Patient understands as well as the family who is present today.  Best to be managed in the inpatient  setting. -Medically necessary excisional debridement of the bilateral ulcers was performed today using a tissue nipper including necrotic muscle and fascial tissue down to the bone right great toe.  The most concerning was the right great toe.  The wound probes directly to the distal phalanx  of the toe.  There is heavy purulence coming from the toe with heavy extensive fibrotic necrotic debris.  Concerning for myopyositis of the toe. -Recommend that the patient goes to the emergency department for full workup and admission.  Recommend x-ray, MRI, and admission for surgical amputation.  Recommend consulting podiatry on-call.  -Patient understands and planning to go to the emergency department tomorrow a.m. at [facility].  In the meantime continue the oral antibiotics prescribed   Edrick Kins, DPM [facility]  Dr. Edrick Kins, DPM    [address], [place] [address]                Office [phone]  Fax [phone]

## 2022-12-18 ENCOUNTER — Inpatient Hospital Stay (HOSPITAL_COMMUNITY): Payer: Medicare PPO

## 2022-12-18 ENCOUNTER — Ambulatory Visit: Payer: Medicare PPO | Admitting: Podiatry

## 2022-12-18 ENCOUNTER — Emergency Department (HOSPITAL_COMMUNITY): Payer: Medicare PPO

## 2022-12-18 ENCOUNTER — Inpatient Hospital Stay (HOSPITAL_COMMUNITY)
Admission: EM | Admit: 2022-12-18 | Discharge: 2022-12-24 | DRG: 616 | Disposition: A | Discharge status: Home Health | Payer: Medicare PPO | Source: Ambulatory Visit | Attending: Internal Medicine | Admitting: Internal Medicine

## 2022-12-18 ENCOUNTER — Other Ambulatory Visit: Payer: Self-pay

## 2022-12-18 ENCOUNTER — Encounter (HOSPITAL_COMMUNITY): Payer: Self-pay | Admitting: Internal Medicine

## 2022-12-18 DIAGNOSIS — R0602 Shortness of breath: Secondary | ICD-10-CM | POA: Diagnosis not present

## 2022-12-18 DIAGNOSIS — E1152 Type 2 diabetes mellitus with diabetic peripheral angiopathy with gangrene: Secondary | ICD-10-CM | POA: Diagnosis present

## 2022-12-18 DIAGNOSIS — F419 Anxiety disorder, unspecified: Secondary | ICD-10-CM

## 2022-12-18 DIAGNOSIS — E876 Hypokalemia: Secondary | ICD-10-CM | POA: Diagnosis present

## 2022-12-18 DIAGNOSIS — E1169 Type 2 diabetes mellitus with other specified complication: Secondary | ICD-10-CM | POA: Diagnosis present

## 2022-12-18 DIAGNOSIS — E11649 Type 2 diabetes mellitus with hypoglycemia without coma: Secondary | ICD-10-CM | POA: Diagnosis present

## 2022-12-18 DIAGNOSIS — E669 Obesity, unspecified: Secondary | ICD-10-CM | POA: Diagnosis present

## 2022-12-18 DIAGNOSIS — I130 Hypertensive heart and chronic kidney disease with heart failure and stage 1 through stage 4 chronic kidney disease, or unspecified chronic kidney disease: Secondary | ICD-10-CM | POA: Diagnosis not present

## 2022-12-18 DIAGNOSIS — E785 Hyperlipidemia, unspecified: Secondary | ICD-10-CM | POA: Diagnosis present

## 2022-12-18 DIAGNOSIS — F0390 Unspecified dementia without behavioral disturbance: Secondary | ICD-10-CM | POA: Diagnosis not present

## 2022-12-18 DIAGNOSIS — D631 Anemia in chronic kidney disease: Secondary | ICD-10-CM | POA: Diagnosis not present

## 2022-12-18 DIAGNOSIS — Z885 Allergy status to narcotic agent status: Secondary | ICD-10-CM

## 2022-12-18 DIAGNOSIS — F418 Other specified anxiety disorders: Secondary | ICD-10-CM | POA: Diagnosis not present

## 2022-12-18 DIAGNOSIS — N1832 Chronic kidney disease, stage 3b: Secondary | ICD-10-CM | POA: Diagnosis not present

## 2022-12-18 DIAGNOSIS — I5033 Acute on chronic diastolic (congestive) heart failure: Secondary | ICD-10-CM | POA: Diagnosis present

## 2022-12-18 DIAGNOSIS — K219 Gastro-esophageal reflux disease without esophagitis: Secondary | ICD-10-CM | POA: Diagnosis present

## 2022-12-18 DIAGNOSIS — E11621 Type 2 diabetes mellitus with foot ulcer: Principal | ICD-10-CM | POA: Diagnosis present

## 2022-12-18 DIAGNOSIS — M86171 Other acute osteomyelitis, right ankle and foot: Secondary | ICD-10-CM | POA: Diagnosis not present

## 2022-12-18 DIAGNOSIS — Z9841 Cataract extraction status, right eye: Secondary | ICD-10-CM

## 2022-12-18 DIAGNOSIS — Z888 Allergy status to other drugs, medicaments and biological substances status: Secondary | ICD-10-CM

## 2022-12-18 DIAGNOSIS — F03B4 Unspecified dementia, moderate, with anxiety: Secondary | ICD-10-CM | POA: Diagnosis present

## 2022-12-18 DIAGNOSIS — M869 Osteomyelitis, unspecified: Secondary | ICD-10-CM | POA: Diagnosis not present

## 2022-12-18 DIAGNOSIS — Z90710 Acquired absence of both cervix and uterus: Secondary | ICD-10-CM

## 2022-12-18 DIAGNOSIS — F03B18 Unspecified dementia, moderate, with other behavioral disturbance: Secondary | ICD-10-CM | POA: Diagnosis not present

## 2022-12-18 DIAGNOSIS — S91101A Unspecified open wound of right great toe without damage to nail, initial encounter: Secondary | ICD-10-CM | POA: Diagnosis not present

## 2022-12-18 DIAGNOSIS — F03B3 Unspecified dementia, moderate, with mood disturbance: Secondary | ICD-10-CM | POA: Diagnosis present

## 2022-12-18 DIAGNOSIS — I5030 Unspecified diastolic (congestive) heart failure: Secondary | ICD-10-CM | POA: Diagnosis present

## 2022-12-18 DIAGNOSIS — M1712 Unilateral primary osteoarthritis, left knee: Secondary | ICD-10-CM | POA: Diagnosis present

## 2022-12-18 DIAGNOSIS — E1165 Type 2 diabetes mellitus with hyperglycemia: Secondary | ICD-10-CM | POA: Diagnosis present

## 2022-12-18 DIAGNOSIS — Z6839 Body mass index (BMI) 39.0-39.9, adult: Secondary | ICD-10-CM

## 2022-12-18 DIAGNOSIS — E1151 Type 2 diabetes mellitus with diabetic peripheral angiopathy without gangrene: Secondary | ICD-10-CM | POA: Diagnosis present

## 2022-12-18 DIAGNOSIS — Z7982 Long term (current) use of aspirin: Secondary | ICD-10-CM

## 2022-12-18 DIAGNOSIS — F32A Anxiety disorder, unspecified: Secondary | ICD-10-CM | POA: Diagnosis present

## 2022-12-18 DIAGNOSIS — Z794 Long term (current) use of insulin: Secondary | ICD-10-CM | POA: Diagnosis not present

## 2022-12-18 DIAGNOSIS — G4733 Obstructive sleep apnea (adult) (pediatric): Secondary | ICD-10-CM | POA: Diagnosis not present

## 2022-12-18 DIAGNOSIS — G4700 Insomnia, unspecified: Secondary | ICD-10-CM | POA: Diagnosis present

## 2022-12-18 DIAGNOSIS — I5031 Acute diastolic (congestive) heart failure: Secondary | ICD-10-CM | POA: Diagnosis not present

## 2022-12-18 DIAGNOSIS — I10 Essential (primary) hypertension: Secondary | ICD-10-CM | POA: Diagnosis not present

## 2022-12-18 DIAGNOSIS — L97519 Non-pressure chronic ulcer of other part of right foot with unspecified severity: Secondary | ICD-10-CM | POA: Diagnosis present

## 2022-12-18 DIAGNOSIS — Z841 Family history of disorders of kidney and ureter: Secondary | ICD-10-CM

## 2022-12-18 DIAGNOSIS — M7989 Other specified soft tissue disorders: Secondary | ICD-10-CM | POA: Diagnosis not present

## 2022-12-18 DIAGNOSIS — G40909 Epilepsy, unspecified, not intractable, without status epilepticus: Secondary | ICD-10-CM | POA: Diagnosis present

## 2022-12-18 DIAGNOSIS — Z833 Family history of diabetes mellitus: Secondary | ICD-10-CM

## 2022-12-18 DIAGNOSIS — Z961 Presence of intraocular lens: Secondary | ICD-10-CM | POA: Diagnosis present

## 2022-12-18 DIAGNOSIS — Z882 Allergy status to sulfonamides status: Secondary | ICD-10-CM

## 2022-12-18 DIAGNOSIS — Z880 Allergy status to penicillin: Secondary | ICD-10-CM

## 2022-12-18 DIAGNOSIS — E1122 Type 2 diabetes mellitus with diabetic chronic kidney disease: Secondary | ICD-10-CM | POA: Diagnosis not present

## 2022-12-18 DIAGNOSIS — N183 Chronic kidney disease, stage 3 unspecified: Secondary | ICD-10-CM | POA: Diagnosis present

## 2022-12-18 DIAGNOSIS — D563 Thalassemia minor: Secondary | ICD-10-CM | POA: Diagnosis present

## 2022-12-18 DIAGNOSIS — Z89421 Acquired absence of other right toe(s): Secondary | ICD-10-CM

## 2022-12-18 DIAGNOSIS — Z9842 Cataract extraction status, left eye: Secondary | ICD-10-CM

## 2022-12-18 DIAGNOSIS — E878 Other disorders of electrolyte and fluid balance, not elsewhere classified: Secondary | ICD-10-CM | POA: Diagnosis present

## 2022-12-18 DIAGNOSIS — Z79899 Other long term (current) drug therapy: Secondary | ICD-10-CM

## 2022-12-18 DIAGNOSIS — N189 Chronic kidney disease, unspecified: Secondary | ICD-10-CM | POA: Diagnosis present

## 2022-12-18 DIAGNOSIS — Z8631 Personal history of diabetic foot ulcer: Secondary | ICD-10-CM

## 2022-12-18 LAB — SEDIMENTATION RATE: Sed Rate: 50 mm/hr — ABNORMAL HIGH (ref 0–22)

## 2022-12-18 LAB — CBC WITH DIFFERENTIAL/PLATELET
Abs Immature Granulocytes: 0.04 10*3/uL (ref 0.00–0.07)
Basophils Absolute: 0 10*3/uL (ref 0.0–0.1)
Basophils Relative: 0 %
Eosinophils Absolute: 0.3 10*3/uL (ref 0.0–0.5)
Eosinophils Relative: 3 %
HCT: 28.4 % — ABNORMAL LOW (ref 36.0–46.0)
Hemoglobin: 8.2 g/dL — ABNORMAL LOW (ref 12.0–15.0)
Immature Granulocytes: 0 %
Lymphocytes Relative: 20 %
Lymphs Abs: 1.8 10*3/uL (ref 0.7–4.0)
MCH: 19.1 pg — ABNORMAL LOW (ref 26.0–34.0)
MCHC: 28.9 g/dL — ABNORMAL LOW (ref 30.0–36.0)
MCV: 66.2 fL — ABNORMAL LOW (ref 80.0–100.0)
Monocytes Absolute: 0.4 10*3/uL (ref 0.1–1.0)
Monocytes Relative: 5 %
Neutro Abs: 6.3 10*3/uL (ref 1.7–7.7)
Neutrophils Relative %: 72 %
Platelets: 348 10*3/uL (ref 150–400)
RBC: 4.29 MIL/uL (ref 3.87–5.11)
RDW: 16.6 % — ABNORMAL HIGH (ref 11.5–15.5)
WBC: 8.9 10*3/uL (ref 4.0–10.5)
nRBC: 0 % (ref 0.0–0.2)

## 2022-12-18 LAB — COMPREHENSIVE METABOLIC PANEL
ALT: 51 U/L — ABNORMAL HIGH (ref 0–44)
AST: 45 U/L — ABNORMAL HIGH (ref 15–41)
Albumin: 3 g/dL — ABNORMAL LOW (ref 3.5–5.0)
Alkaline Phosphatase: 87 U/L (ref 38–126)
Anion gap: 15 (ref 5–15)
BUN: 25 mg/dL — ABNORMAL HIGH (ref 8–23)
CO2: 35 mmol/L — ABNORMAL HIGH (ref 22–32)
Calcium: 8.8 mg/dL — ABNORMAL LOW (ref 8.9–10.3)
Chloride: 89 mmol/L — ABNORMAL LOW (ref 98–111)
Creatinine, Ser: 1.53 mg/dL — ABNORMAL HIGH (ref 0.44–1.00)
GFR, Estimated: 34 mL/min — ABNORMAL LOW (ref >60)
Glucose, Bld: 207 mg/dL — ABNORMAL HIGH (ref 70–99)
Potassium: 2.3 mmol/L — CL (ref 3.5–5.1)
Sodium: 139 mmol/L (ref 135–145)
Total Bilirubin: 0.7 mg/dL (ref 0.3–1.2)
Total Protein: 7.3 g/dL (ref 6.5–8.1)

## 2022-12-18 LAB — GLUCOSE, CAPILLARY: Glucose-Capillary: 174 mg/dL — ABNORMAL HIGH (ref 70–99)

## 2022-12-18 LAB — C-REACTIVE PROTEIN: CRP: 13.8 mg/dL — ABNORMAL HIGH (ref <1.0)

## 2022-12-18 LAB — BRAIN NATRIURETIC PEPTIDE: B Natriuretic Peptide: 428.5 pg/mL — ABNORMAL HIGH (ref 0.0–100.0)

## 2022-12-18 LAB — CBG MONITORING, ED: Glucose-Capillary: 190 mg/dL — ABNORMAL HIGH (ref 70–99)

## 2022-12-18 MED ORDER — POTASSIUM CHLORIDE 10 MEQ/100ML IV SOLN
10.0000 meq | INTRAVENOUS | Status: AC
  Administered 2022-12-18 (×2): 10 meq via INTRAVENOUS
  Filled 2022-12-18 (×2): qty 100

## 2022-12-18 MED ORDER — HYDROCODONE-ACETAMINOPHEN 5-325 MG PO TABS
1.0000 | ORAL_TABLET | Freq: Once | ORAL | Status: AC
  Administered 2022-12-18: 1 via ORAL
  Filled 2022-12-18: qty 1

## 2022-12-18 MED ORDER — INSULIN GLARGINE-YFGN 100 UNIT/ML ~~LOC~~ SOLN
12.0000 [IU] | Freq: Every day | SUBCUTANEOUS | Status: DC
  Administered 2022-12-19 – 2022-12-23 (×5): 12 [IU] via SUBCUTANEOUS
  Filled 2022-12-18 (×9): qty 0.12

## 2022-12-18 MED ORDER — VANCOMYCIN HCL 1750 MG/350ML IV SOLN
1750.0000 mg | Freq: Once | INTRAVENOUS | Status: AC
  Administered 2022-12-18: 1750 mg via INTRAVENOUS
  Filled 2022-12-18: qty 350

## 2022-12-18 MED ORDER — INSULIN ASPART 100 UNIT/ML IJ SOLN
0.0000 [IU] | Freq: Three times a day (TID) | INTRAMUSCULAR | Status: DC
  Administered 2022-12-19 (×2): 1 [IU] via SUBCUTANEOUS
  Administered 2022-12-19: 3 [IU] via SUBCUTANEOUS
  Administered 2022-12-21: 2 [IU] via SUBCUTANEOUS
  Administered 2022-12-21: 1 [IU] via SUBCUTANEOUS
  Administered 2022-12-22: 2 [IU] via SUBCUTANEOUS
  Administered 2022-12-22 – 2022-12-23 (×2): 1 [IU] via SUBCUTANEOUS
  Administered 2022-12-23: 2 [IU] via SUBCUTANEOUS
  Administered 2022-12-23 – 2022-12-24 (×2): 1 [IU] via SUBCUTANEOUS

## 2022-12-18 MED ORDER — FUROSEMIDE 10 MG/ML IJ SOLN
60.0000 mg | Freq: Two times a day (BID) | INTRAMUSCULAR | Status: DC
  Administered 2022-12-18: 60 mg via INTRAVENOUS
  Filled 2022-12-18: qty 6

## 2022-12-18 MED ORDER — ENOXAPARIN SODIUM 40 MG/0.4ML IJ SOSY
40.0000 mg | PREFILLED_SYRINGE | INTRAMUSCULAR | Status: DC
  Administered 2022-12-19 – 2022-12-24 (×6): 40 mg via SUBCUTANEOUS
  Filled 2022-12-18 (×6): qty 0.4

## 2022-12-18 MED ORDER — MELATONIN 3 MG PO TABS
3.0000 mg | ORAL_TABLET | Freq: Every evening | ORAL | Status: DC | PRN
  Administered 2022-12-18 – 2022-12-23 (×6): 3 mg via ORAL
  Filled 2022-12-18 (×6): qty 1

## 2022-12-18 MED ORDER — SODIUM CHLORIDE 0.9 % IV SOLN
2.0000 g | Freq: Two times a day (BID) | INTRAVENOUS | Status: DC
  Administered 2022-12-19 – 2022-12-22 (×7): 2 g via INTRAVENOUS
  Filled 2022-12-18 (×7): qty 12.5

## 2022-12-18 MED ORDER — FUROSEMIDE 10 MG/ML IJ SOLN
60.0000 mg | Freq: Two times a day (BID) | INTRAMUSCULAR | Status: AC
  Administered 2022-12-19: 60 mg via INTRAVENOUS
  Filled 2022-12-18: qty 6

## 2022-12-18 MED ORDER — MAGNESIUM OXIDE -MG SUPPLEMENT 400 (240 MG) MG PO TABS
800.0000 mg | ORAL_TABLET | Freq: Once | ORAL | Status: AC
  Administered 2022-12-18: 800 mg via ORAL
  Filled 2022-12-18: qty 2

## 2022-12-18 MED ORDER — SODIUM CHLORIDE 0.9 % IV SOLN
2.0000 g | Freq: Once | INTRAVENOUS | Status: AC
  Administered 2022-12-18: 2 g via INTRAVENOUS
  Filled 2022-12-18: qty 12.5

## 2022-12-18 MED ORDER — POTASSIUM CHLORIDE CRYS ER 20 MEQ PO TBCR
40.0000 meq | EXTENDED_RELEASE_TABLET | ORAL | Status: AC
  Administered 2022-12-18: 40 meq via ORAL
  Filled 2022-12-18: qty 2

## 2022-12-18 MED ORDER — POTASSIUM CHLORIDE CRYS ER 20 MEQ PO TBCR
40.0000 meq | EXTENDED_RELEASE_TABLET | Freq: Once | ORAL | Status: AC
  Administered 2022-12-18: 40 meq via ORAL
  Filled 2022-12-18: qty 2

## 2022-12-18 MED ORDER — VANCOMYCIN HCL 1500 MG/300ML IV SOLN
1500.0000 mg | INTRAVENOUS | Status: DC
  Administered 2022-12-20: 1500 mg via INTRAVENOUS
  Filled 2022-12-18 (×2): qty 300

## 2022-12-18 NOTE — ED Triage Notes (Signed)
Pt sent here from podiatry to have R great toe amputated d/t osteomyelitis

## 2022-12-18 NOTE — Progress Notes (Signed)
Pharmacy Antibiotic Note  Laura Atkins is a 82 y.o. female admitted on 12/18/2022 with  osteomyelitis .  Pharmacy has been consulted for vancomycin and cefepime dosing.  Plan: Cefepime 2g q12h.  Give IV Vancomycin 1750mg  x 1 for loading dose, followed by IV Vancomycin 1500 every 48 hours for eAUC430. Using Vd: 0.5, Scr: 1.53.  Follow culture data for de-escalation.  Monitor renal function for dose adjustments as indicated.    Weight: 118.7 kg (261 lb 11 oz)  Temp (24hrs), Avg:98.2 F (36.8 C), Min:98.2 F (36.8 C), Max:98.2 F (36.8 C)  Recent Labs  Lab 12/18/22 1244  WBC 8.9  CREATININE 1.53*    Estimated Creatinine Clearance: 40.4 mL/min (A) (by C-G formula based on SCr of 1.53 mg/dL (H)).    Allergies  Allergen Reactions   Penicillins Shortness Of Breath, Itching and Rash   Prednisone Shortness Of Breath, Itching and Rash   Propoxyphene N-Acetaminophen Itching and Nausea And Vomiting   Sulfa Antibiotics Itching and Nausea And Vomiting   Thank you for allowing pharmacy to be a part of this patient's care.  Esmeralda Arthur, PharmD, BCCCP  12/18/2022 2:18 PM

## 2022-12-18 NOTE — H&P (Signed)
History and Physical    Patient: Laura Atkins [id number] DOB: [birth year] DOA: 12/18/2022 DOS: the patient was seen and examined on 12/18/2022 PCP: Fayrene Helper, MD  Patient coming from: Home   Chief Complaint:  Chief Complaint  Patient presents with   Osteomyelitis    HPI: Laura Atkins is a 82 y.o. female with medical history significant of hypertension, hyperlipidemia, diabetes mellitus type 2, anxiety, and depression who presented with worsening ulcer of the right great toe.  History is obtained with assistance of patient's daughter who is also her POA present at bedside as the patient has history of dementia.  She had previously been treated for an ulcer on her left great toe that had subsequently healed.  However, patient did not make family aware of the ulcer on the right great toe until 1 week ago.  Daughter notes that she was walking with her walker slower than normal and complaining of pain in her right foot.  Associated symptoms included lower extremity swelling and shortness of breath.  She had been seen by Dr. Theador Hawthorne of nephrology 3/21 due to her symptoms.  Patient has been prescribed empiric antibiotics of ciprofloxacin, clindamycin, Keflex, and was referred her to podiatry.  Patient has been unable to tolerate the clindamycin and Keflex.  Nephrology had also increased her torsemide to 40 mg twice daily during that appointment due to worsening swelling.  She had been able to be seen by Dr. Amalia Hailey of podiatry yesterday where the wound was debrided and culture sent.  It was noted that the wound was able to be probed down to the bone which there was concern for osteomyelitis.  In the emergency department patient was noted to be afebrile with blood pressures maintained.  Labs significant for WBC 8.9, hemoglobin 8.2, potassium 2.3, BUN 25, creatinine 1.53, glucose 207, AST 45, ALT 41, sedimentation rate 50, and CRP 13.8.  X-rays of the right foot noted soft tissue swelling with  bony erosive changes of the distal tuft of the distal phalanx of the great toe consistent with osteomyelitis.  Dr. Blenda Mounts of [facility] with plans for possible surgery on Thursday 3/28.  Patient has been given total of 6 mEq of potassium chloride, magnesium oxide 800 mg p.o., vancomycin, and cefepime.  Review of Systems: As mentioned in the history of present illness. All other systems reviewed and are negative. Past Medical History:  Diagnosis Date   Alpha thalassemia trait 01/26/2010   02/2012: Nl CBC ex H&H-10.7/34.8, MCV-69    Anemia    Anxiety    Anxiety and depression    Cellulitis 05/09/2017   Depression    Diabetes mellitus    Foot pain, right 04/30/2013   GERD (gastroesophageal reflux disease)    Headache(784.0)    Hyperlipidemia    Hypertension    Iron deficiency 01/21/2017   Microcytic anemia 01/26/2010   02/2012: Nl CBC ex H&H-10.7/34.8, MCV-69    NECK PAIN, CHRONIC 10/21/2008   +chronic back pain     Obesity    Obstructive sleep apnea    Osteoarthritis    Left knee; right shoulder; chronic neck and back pain   Pruritus    PVD (peripheral vascular disease) ([place]) 01/28/2014   Seizures (HCC)    Shoulder pain, right 04/14/2015   Tremor    This started months ago after her seizure progressing to very poor hand writing   Urinary incontinence    UTI (urinary tract infection) 01/18/2013   Past Surgical History:  Procedure Laterality Date   ABDOMINAL HYSTERECTOMY     BREAST EXCISIONAL BIOPSY     Left; cyst   CATARACT EXTRACTION Right    12/2017   CATARACT EXTRACTION W/ INTRAOCULAR LENS IMPLANT Left 09/07/2013   CHOLECYSTECTOMY     COLONOSCOPY     COLONOSCOPY N/A 07/20/2015   Procedure: COLONOSCOPY;  Surgeon: Rogene Houston, MD;  Location: [facility];  Service: Endoscopy;  Laterality: N/A;  930   EYE SURGERY Left 09/07/2013   cataract   Social History:  reports that she has never smoked. She has been exposed to tobacco smoke. She has never used  smokeless tobacco. She reports that she does not drink alcohol and does not use drugs.  Allergies  Allergen Reactions   Penicillins Shortness Of Breath, Itching and Rash   Prednisone Shortness Of Breath, Itching and Rash   Propoxyphene N-Acetaminophen Itching and Nausea And Vomiting   Sulfa Antibiotics Itching and Nausea And Vomiting    Family History  Problem Relation Age of Onset   Lung cancer Mother 77   Kidney disease Father    Diabetes Sister    Keloids Brother    ADD / ADHD Grandchild    Bipolar disorder Grandchild    Bipolar disorder Daughter    Seizures Daughter    Heart disease Daughter    Kidney disease Son    Neuropathy Son    Kidney disease Son    Edema Daughter    Breast cancer Daughter 76   Allergies Daughter    Alcohol abuse Neg Hx    Drug abuse Neg Hx     Prior to Admission medications   Medication Sig Start Date End Date Taking? Authorizing Provider  Accu-Chek FastClix Lancets MISC 1 each by Does not apply route 4 (four) times daily. Use to monitor glucose levels 4 times daily; E11.65 04/10/22   Brita Romp, NP  Alcohol Swabs (B-D SINGLE USE SWABS REGULAR) PADS 1 each by Does not apply route 4 (four) times daily. Use to prep site for glucose monitoring 4 times daily; E11.65 06/12/19   Renato Shin, MD  amLODipine (NORVASC) 5 MG tablet Take 1.5 tablets (7.5 mg total) by mouth daily. 08/22/22 02/18/23  Arnoldo Lenis, MD  aspirin 81 MG tablet Take 81 mg by mouth daily.    [provider]  atorvastatin (LIPITOR) 40 MG tablet TAKE 1 TABLET BY MOUTH EVERY DAY 10/25/22   Fayrene Helper, MD  benazepril (LOTENSIN) 20 MG tablet TAKE 1 TABLET BY MOUTH EVERY DAY 08/27/22   Fayrene Helper, MD  Blood Glucose Monitoring Suppl (ACCU-CHEK GUIDE ME) w/Device KIT 1 each by Does not apply route 4 (four) times daily. Use to monitor glucose levels 4 times daily; E11.65 06/12/19   Renato Shin, MD  buPROPion (WELLBUTRIN XL) 150 MG 24 hr tablet TAKE 1 TABLET  BY MOUTH EVERY MORNING 09/30/22   Cloria Spring, MD  Cholecalciferol 125 MCG (5000 UT) TABS Take 5,000 Units by mouth daily.    [provider]  dicyclomine (BENTYL) 10 MG capsule Take 1 capsule (10 mg total) by mouth every 12 (twelve) hours as needed for spasms. 03/22/22   Harvel Quale, MD  diphenhydrAMINE (BENADRYL) 25 MG tablet Take 25 mg by mouth at bedtime.    [provider]  donepezil (ARICEPT) 10 MG tablet Take 1 tablet (10 mg total) by mouth at bedtime. 11/14/22   Fayrene Helper, MD  gabapentin (NEURONTIN) 400 MG capsule TAKE ONE CAPSULE  BY MOUTH EVERY MORNING and TAKE ONE CAPSULE AT NOON and TAKE TWO CAPSULES AT BEDTIME 08/27/22   Fayrene Helper, MD  glucose blood (ACCU-CHEK GUIDE) test strip USE TO CHECK BLOOD SUGAR FOUR TIMES DAILY 09/07/22   Brita Romp, NP  glucose blood test strip 1 each by Other route in the morning, at noon, in the evening, and at bedtime. Use as instructed qid. E11.65    [provider]  Incontinence Supply Disposable (TRANQUILITY UNDERGARMENTS) MISC 1 each by Does not apply route daily. 10/29/22   Harvel Quale, MD  insulin degludec (TRESIBA FLEXTOUCH) 200 UNIT/ML FlexTouch Pen Inject 12 Units into the skin at bedtime. 05/10/22   Brita Romp, NP  Insulin Pen Needle (EASY TOUCH PEN NEEDLES) 31G X 5 MM MISC USE TO INJECT INSULIN ONCE DAILY 08/14/22   Brita Romp, NP  Insulin Syringe-Needle U-100 (INSULIN SYRINGE 1CC/31GX5/16") 31G X 5/16" 1 ML MISC 1 each by Does not apply route daily. Use to inject insulin daily; E11.65 06/12/19   Renato Shin, MD  Multiple Vitamin (MULTIVITAMIN) capsule Take 1 capsule by mouth daily.    [provider]  NONFORMULARY OR COMPOUNDED ITEM Apothecary hemorrhoid cream. Apply rectally up to qid as directed.    [provider]  omeprazole (PRILOSEC) 20 MG capsule TAKE ONE CAPSULE BY MOUTH EVERY DAY 10/25/22   Fayrene Helper, MD  sertraline  (ZOLOFT) 100 MG tablet TAKE 1 TABLET BY MOUTH DAILY 07/30/22   Cloria Spring, MD  torsemide (DEMADEX) 20 MG tablet TAKE 2 TABLETS BY MOUTH EVERY DAY and additionally TAKE 1 TABLET EVERY OTHER DAY 03/28/22   Arnoldo Lenis, MD  traZODone (DESYREL) 50 MG tablet TAKE 1/2 TABLET BY MOUTH AT BEDTIME FOR SLEEP 11/21/22   Fayrene Helper, MD  UNABLE TO FIND Walker x 1  DX unsteady gait, osteoarthritis of left knee 02/28/17   Fayrene Helper, MD  UNABLE TO FIND Elevated [place] Seat x 1 DX: unsteady gait, back pain, osteoarthritis of left knee 02/28/17   Fayrene Helper, MD  UNABLE TO FIND Standing upright walker x 1  DX M54.40, M19.90 03/01/20   Fayrene Helper, MD  UNABLE TO FIND Incontinence pads and supplies 10/18/20   Noreene Larsson, NP  UNABLE TO FIND Rollator Gilford Rile 12/26/20   Fayrene Helper, MD  UNABLE TO FIND XL Tranquility Briefs for incontinence. 12/26/20   Fayrene Helper, MD  UNABLE TO FIND Premium overnight disposible absorbant underwear XL [employer]PC- [id number] 06/22/22   Fayrene Helper, MD    Physical Exam: Vitals:   12/18/22 1234 12/18/22 1245 12/18/22 1300  BP: (!) 153/51 (!) 139/53 (!) 138/51  Pulse: 72 69 70  Resp: 16    Temp: 98.2 F (36.8 C)    SpO2: 97% 99% 100%   Constitutional: Elderly female who appears to be in no acute distress Eyes: PERRL, lids and conjunctivae normal ENMT: Mucous membranes are moist. Posterior pharynx clear of any exudate or lesions.Normal dentition.  Neck: normal, supple, no masses, no thyromegaly Respiratory: clear to auscultation bilaterally, no wheezing, no crackles. Normal respiratory effort. No accessory muscle use.  Cardiovascular: Regular rate and rhythm,   At least +2 pitting bilateral lower extremity edema. Abdomen: no tenderness, no masses palpated.   Bowel sounds positive.  Musculoskeletal: no clubbing / cyanosis. No joint deformity upper and lower extremities.  Normal muscle tone.  Skin: There is a healed ulceration  noted on the left great  toe.  Ulceration of the right great toe as seen below status post debridement with podiatry  Neurologic: CN 2-12 grossly intact. Strength 5/5 in all 4.  Psychiatric: Patient is at least alert and oriented to person, place, and is able to tell me the year is 2024 at this time.  Data Reviewed:  Reviewed labs, imaging, and pertinent records as noted above in HPI  Assessment and Plan: Osteomyelitis of the right great toe Acute.  Patient presents with complaints of ulcer of right great toe that was just noticed last week.  Had been started on empiric antibiotics of ciprofloxacin and is unable to tolerate clindamycin and Keflex prescribed.  Seen by Dr. Amalia Hailey of podiatry yesterday who was able to probe the wound down to bone and tip cultures.  Labs significant for CRP 13.8 and ESR 50.  X-rays of the right foot noted soft tissue swelling with erosions of the distal aspect of the first distal phalanx.  Wound cultures noted a few white blood cells seen rare epithelial cells, many gram-positive cocci in pairs and many gram-negative bacilli.  Patient has been started on empiric antibiotics of vancomycin and cefepime. -Admit to a telemetry bed -Follow-up wound culture -Check MRI of the right foot -Continue vancomycin and cefepime -Dr. Blenda Mounts of [facility] consulted,  will follow-up for any further recommendations  Hypokalemia Acute.  Initial potassium 2.3.  Patient had been ordered a total of potassium chloride 60 mEq.  Thought secondary to recently increased dose of Torsemide. -Given additional 40 mEq of potassium chloride p.o. -Continue to monitor and replace as needed  Heart failure with preserved EF Acute on Chronic. Patient had reported some complaints of shortness of breath seem to be worsening.  On physical exam patient with at least +2 pitting lower extremity edema.  Chest x-ray noted cardiomegaly without acute abnormality.  Family noted no improvement in lower  extremity swelling despite increasing torsemide to 40 mg twice daily.  Last echocardiogram from 2021 had noted EF to be [id number] with diastolic parameters noted to be within normal limits. -Strict I&Os and daily weights -Check BNP -Check echocardiogram -Lasix 60 mg IV twice daily x 2 doses.  Reassess determine need of further diuresis  Uncontrolled diabetes mellitus type 2, with long-term use of insulin On admission glucose 207 Hemoglobin A1c was 7.8.  Home medication regimen includes Tresiba 12 units nightly. -Hypoglycemic protocols -Continue Semglee substituted for Tresiba -CBGs before every meal with sensitive SSI  Essential hypertension -Continue home blood pressure regimen  Anemia of chronic kidney disease Alpha thalassemia trait Chronic.  Hemoglobin 8.2 which appears near patient's baseline recently.  No reports of bleeding. -Continue to monitor  Chronic kidney disease stage IIIb Creatinine 1.53 which appears around patient's baseline.  Patient is followed in outpatient setting by Dr.Bhutani. -Continue to monitor kidney function with diuresis -Continue outpatient follow-up  Dementia Patient reported to have moderate dementia for which her daughter is her healthcare power of attorney. -Delirium precautions -Continue Aricept  Depression and anxiety -Continue Zoloft and Wellbutrin  Obesity  BMI 39.79 kg/m  OSA Daughter reports patient is not compliant with using CPAP at night.  DVT prophylaxis: Lovenox Advance Care Planning:   Code Status: Full Code    Consults: Podiatry  Family Communication: Daughter updated at bedside  Severity of Illness: The appropriate patient status for this patient is INPATIENT. Inpatient status is judged to be reasonable and necessary in order to provide the required intensity of service to ensure the patient's safety. The  patient's presenting symptoms, physical exam findings, and initial radiographic and laboratory data in the context of  their chronic comorbidities is felt to place them at high risk for further clinical deterioration. Furthermore, it is not anticipated that the patient will be medically stable for discharge from the hospital within 2 midnights of admission.   * I certify that at the point of admission it is my clinical judgment that the patient will require inpatient hospital care spanning beyond 2 midnights from the point of admission due to high intensity of service, high risk for further deterioration and high frequency of surveillance required.*  Author: Norval Morton, MD 12/18/2022 2:02 PM  For on call review [email or web address].

## 2022-12-18 NOTE — Progress Notes (Signed)
[facility] night cross cover note:   I have placed formal wound care consult order, requesting assistance with wound care relating to lesion identified on right great toe.     Babs Bertin, DO Hospitalist

## 2022-12-18 NOTE — ED Notes (Signed)
ED TO INPATIENT HANDOFF REPORT  ED Nurse Name and Phone #:  Arther Abbott RN   S Name/Age/Gender Laura Atkins 82 y.o. female Room/Bed: 045C/045C  Code Status   Code Status: Full Code  Home/SNF/Other Home Patient oriented to: place Is this baseline? Yes   Triage Complete: Triage complete  Chief Complaint Osteomyelitis [facility]) [M86.9]  Triage Note Pt sent here from podiatry to have R great toe amputated d/t osteomyelitis    Allergies Allergies  Allergen Reactions   Penicillins Shortness Of Breath, Itching and Rash   Prednisone Shortness Of Breath, Itching and Rash   Propoxyphene N-Acetaminophen Itching and Nausea And Vomiting   Sulfa Antibiotics Itching and Nausea And Vomiting    Level of Care/Admitting Diagnosis ED Disposition     ED Disposition  Admit   Condition  --   [place]: [place] [id number]  Level of Care: Telemetry Medical [104]  May admit patient to [facility] or [facility] if equivalent level of care is available:: No  Covid Evaluation: Asymptomatic - no recent exposure (last 10 days) testing not required  Diagnosis: Osteomyelitis [facility]) [id number]  Admitting Physician: Norval Morton [id number]  Attending Physician: Norval Morton [id number]  Certification:: I certify this patient will need inpatient services for at least 2 midnights  Estimated Length of Stay: 2          B Medical/Surgery History Past Medical History:  Diagnosis Date   Alpha thalassemia trait 01/26/2010   02/2012: Nl CBC ex H&H-10.7/34.8, MCV-69    Anemia    Anxiety    Anxiety and depression    Cellulitis 05/09/2017   Depression    Diabetes mellitus    Foot pain, right 04/30/2013   GERD (gastroesophageal reflux disease)    Headache(784.0)    Hyperlipidemia    Hypertension    Iron deficiency 01/21/2017   Microcytic anemia 01/26/2010   02/2012: Nl CBC ex H&H-10.7/34.8, MCV-69    NECK PAIN, CHRONIC 10/21/2008   +chronic back pain      Obesity    Obstructive sleep apnea    Osteoarthritis    Left knee; right shoulder; chronic neck and back pain   Pruritus    PVD (peripheral vascular disease) ([place]) 01/28/2014   Seizures ([place])    Shoulder pain, right 04/14/2015   Tremor    This started months ago after her seizure progressing to very poor hand writing   Urinary incontinence    UTI (urinary tract infection) 01/18/2013   Past Surgical History:  Procedure Laterality Date   ABDOMINAL HYSTERECTOMY     BREAST EXCISIONAL BIOPSY     Left; cyst   CATARACT EXTRACTION Right    12/2017   CATARACT EXTRACTION W/ INTRAOCULAR LENS IMPLANT Left 09/07/2013   CHOLECYSTECTOMY     COLONOSCOPY     COLONOSCOPY N/A 07/20/2015   Procedure: COLONOSCOPY;  Surgeon: Rogene Houston, MD;  Location: [facility];  Service: Endoscopy;  Laterality: N/A;  930   EYE SURGERY Left 09/07/2013   cataract     A IV Location/Drains/Wounds Patient Lines/Drains/Airways Status     Active Line/Drains/Airways     Name Placement date Placement time Site Days   Peripheral IV 12/18/22 20 G Right Antecubital 12/18/22  1242  Antecubital  less than 1   External Urinary Catheter 12/18/22  1708  --  less than 1   Wound / Incision (Open or Dehisced) 12/18/22 Toe (Comment  which one) Anterior;Right open wound, serosanguinous drainage, on  big toe 12/18/22  1503  Toe (Comment  which one)  less than 1            Intake/Output Last 24 hours  Intake/Output Summary (Last 24 hours) at 12/18/2022 1711 Last data filed at 12/18/2022 1630 Gross per 24 hour  Intake 447.08 ml  Output --  Net 447.08 ml    Labs/Imaging Results for orders placed or performed during the hospital encounter of 12/18/22 (from the past 48 hour(s))  Comprehensive metabolic panel     Status: Abnormal   Collection Time: 12/18/22 12:44 PM  Result Value Ref Range   Sodium 139 135 - 145 mmol/L   Potassium 2.3 (LL) 3.5 - 5.1 mmol/L    Comment: CRITICAL RESULT CALLED TO, READ BACK BY AND  VERIFIED WITH S.BERTRAND,RN 1347 12/18/22 CLARK,S   Chloride 89 (L) 98 - 111 mmol/L   CO2 35 (H) 22 - 32 mmol/L   Glucose, Bld 207 (H) 70 - 99 mg/dL    Comment: Glucose reference range applies only to samples taken after fasting for at least 8 hours.   BUN 25 (H) 8 - 23 mg/dL   Creatinine, Ser 1.53 (H) 0.44 - 1.00 mg/dL   Calcium 8.8 (L) 8.9 - 10.3 mg/dL   Total Protein 7.3 6.5 - 8.1 g/dL   Albumin 3.0 (L) 3.5 - 5.0 g/dL   AST 45 (H) 15 - 41 U/L   ALT 51 (H) 0 - 44 U/L   Alkaline Phosphatase 87 38 - 126 U/L   Total Bilirubin 0.7 0.3 - 1.2 mg/dL   GFR, Estimated 34 (L) >60 mL/min    Comment: (NOTE) Calculated using the CKD-EPI Creatinine Equation (2021)    Anion gap 15 5 - 15    Comment: Performed at [facility], [place] [address] [place], [place] [address]  CBC with Differential     Status: Abnormal   Collection Time: 12/18/22 12:44 PM  Result Value Ref Range   WBC 8.9 4.0 - 10.5 K/uL   RBC 4.29 3.87 - 5.11 MIL/uL   Hemoglobin 8.2 (L) 12.0 - 15.0 g/dL    Comment: Reticulocyte Hemoglobin testing may be clinically indicated, consider ordering this additional test [id number]    HCT 28.4 (L) 36.0 - 46.0 %   MCV 66.2 (L) 80.0 - 100.0 fL   MCH 19.1 (L) 26.0 - 34.0 pg   MCHC 28.9 (L) 30.0 - 36.0 g/dL   RDW 16.6 (H) 11.5 - 15.5 %   Platelets 348 150 - 400 K/uL    Comment: REPEATED TO VERIFY   nRBC 0.0 0.0 - 0.2 %   Neutrophils Relative % 72 %   Neutro Abs 6.3 1.7 - 7.7 K/uL   Lymphocytes Relative 20 %   Lymphs Abs 1.8 0.7 - 4.0 K/uL   Monocytes Relative 5 %   Monocytes Absolute 0.4 0.1 - 1.0 K/uL   Eosinophils Relative 3 %   Eosinophils Absolute 0.3 0.0 - 0.5 K/uL   Basophils Relative 0 %   Basophils Absolute 0.0 0.0 - 0.1 K/uL   Immature Granulocytes 0 %   Abs Immature Granulocytes 0.04 0.00 - 0.07 K/uL    Comment: Performed at [facility], [address], [place], [place] [address]  Sedimentation rate     Status: Abnormal   Collection Time: 12/18/22 12:44 PM   Result Value Ref Range   Sed Rate 50 (H) 0 - 22 mm/hr    Comment: Performed at [facility]  [address], [place], [place] [address]  C-reactive protein     Status: Abnormal   Collection Time: 12/18/22 12:44 PM  Result Value Ref Range   CRP 13.8 (H) <1.0 mg/dL    Comment: Performed at [facility] [address], [place], [place] [address]   *Note: Due to a large number of results and/or encounters for the requested time period, some results have not been displayed. A complete set of results can be found in Results Review.   DG CHEST PORT 1 VIEW  Result Date: 12/18/2022 CLINICAL DATA:  Shortness of breath EXAM: PORTABLE CHEST 1 VIEW COMPARISON:  Chest x-ray 05/27/2020 FINDINGS: The heart is mildly enlarged. The lungs are clear. There is no pleural effusion or pneumothorax. No acute fractures are seen. IMPRESSION: Mild cardiomegaly. No acute pulmonary process. Electronically Signed   By: Ronney Asters M.D.   On: 12/18/2022 16:28   DG Foot Complete Right  Result Date: 12/18/2022 CLINICAL DATA:  Osteomyelitis of the great toe. EXAM: RIGHT FOOT COMPLETE - 3 VIEW COMPARISON:  None Available. FINDINGS: Global severe osteopenia. Vascular calcifications. Diffuse soft tissue swelling. Well corticated plantar and Achilles calcaneal spurs. Mild degenerative changes of the midfoot. There is soft tissue swelling about the great toe with erosive changes of the distal tuft of the distal phalanx of the great toe. This would be consistent with patient's history of osteomyelitis. IMPRESSION: Soft tissue swelling particularly of the great toe with bony erosive changes of the distal tuft of the distal phalanx of the great toe. This would be consistent with provided history of osteomyelitis. Global osteopenia with soft tissue swelling, vascular calcifications. Mild degenerative changes. Electronically Signed   By: Jill Side M.D.   On: 12/18/2022 13:18    Pending Labs Unresulted Labs (From  admission, onward)     Start     Ordered   12/19/22 0500  Renal function panel  Daily,   R      12/18/22 1655   12/19/22 0500  CBC  Tomorrow morning,   R        12/18/22 1655   12/18/22 1606  Brain natriuretic peptide  Once,   R        12/18/22 1605            Vitals/Pain Today's Vitals   12/18/22 1605 12/18/22 1651 12/18/22 1705 12/18/22 1709  BP:   133/67   Pulse:   77   Resp:   (!) 22   Temp: 98.1 F (36.7 C)  98.2 F (36.8 C)   TempSrc: Oral  Oral   SpO2:   95%   Weight:      PainSc:  0-No pain  0-No pain    Isolation Precautions No active isolations  Medications Medications  ceFEPIme (MAXIPIME) 2 g in sodium chloride 0.9 % 100 mL IVPB (has no administration in time range)  vancomycin (VANCOREADY) IVPB 1500 mg/300 mL (has no administration in time range)  insulin glargine-yfgn (SEMGLEE) injection 12 Units (has no administration in time range)  insulin aspart (novoLOG) injection 0-9 Units (has no administration in time range)  furosemide (LASIX) injection 60 mg (has no administration in time range)  ceFEPIme (MAXIPIME) 2 g in sodium chloride 0.9 % 100 mL IVPB (0 g Intravenous Stopped 12/18/22 1358)  vancomycin (VANCOREADY) IVPB 1750 mg/350 mL (0 mg Intravenous Stopped 12/18/22 1630)  potassium chloride SA (KLOR-CON M) CR tablet 40 mEq (40 mEq Oral Given 12/18/22 1417)  potassium chloride 10 mEq in 100 mL  IVPB (0 mEq Intravenous Stopped 12/18/22 1526)  magnesium oxide (MAG-OX) tablet 800 mg (800 mg Oral Given 12/18/22 1417)  HYDROcodone-acetaminophen (NORCO/VICODIN) 5-325 MG per tablet 1 tablet (1 tablet Oral Given 12/18/22 1417)  potassium chloride SA (KLOR-CON M) CR tablet 40 mEq (40 mEq Oral Given 12/18/22 1650)    Mobility walks with device     Focused Assessments Pulmonary Assessment Handoff:  Lung sounds:   O2 Device: Room Air      R Recommendations: See Admitting Provider Note  Report given to:   Additional Notes:

## 2022-12-18 NOTE — ED Provider Notes (Signed)
[facility] Provider Note  CSN: [id number] Arrival date & time: 12/18/22 1216  Chief Complaint(s) Osteomyelitis   HPI Laura Atkins is a 82 y.o. female with PMH anemia, anxiety, dementia, HTN, seizure disorder, T2DM who presents emergency department for evaluation of a foot wound.  Patient was seen in podiatry clinic yesterday by Dr. Daylene Katayama DPM who had significant concern about developing osteomyelitis to the right great toe and performed an outpatient debridement and expression of the significant amount of purulent fluid at that office visit.  He spoke with the patient today encouraging the patient to come to the emergency department for inpatient hospital admission, IV antibiotics and likely amputation.  Patient arrives with complaints of toe pain and swelling but denies chest pain, shortness of breath, abdominal pain, nausea, vomiting, fevers or other systemic symptoms.  Patient has had increasing bilateral lower extremity edema and weight gain over the last 2 weeks.  Patient recently increased her torsemide dosing.  History primarily obtained from patient's daughter.   Past Medical History Past Medical History:  Diagnosis Date   Alpha thalassemia trait 01/26/2010   02/2012: Nl CBC ex H&H-10.7/34.8, MCV-69    Anemia    Anxiety    Anxiety and depression    Cellulitis 05/09/2017   Depression    Diabetes mellitus    Foot pain, right 04/30/2013   GERD (gastroesophageal reflux disease)    Headache(784.0)    Hyperlipidemia    Hypertension    Iron deficiency 01/21/2017   Microcytic anemia 01/26/2010   02/2012: Nl CBC ex H&H-10.7/34.8, MCV-69    NECK PAIN, CHRONIC 10/21/2008   +chronic back pain     Obesity    Obstructive sleep apnea    Osteoarthritis    Left knee; right shoulder; chronic neck and back pain   Pruritus    PVD (peripheral vascular disease) ([place]) 01/28/2014   Seizures (HCC)    Shoulder pain, right 04/14/2015   Tremor     This started months ago after her seizure progressing to very poor hand writing   Urinary incontinence    UTI (urinary tract infection) 01/18/2013   Patient Active Problem List   Diagnosis Date Noted   Osteomyelitis ([place]) 12/18/2022   Hypokalemia 12/18/2022   Heart failure with preserved ejection fraction ([place]) 12/18/2022   Chronic kidney disease, stage III (moderate) (HCC) 12/18/2022   Obesity (BMI 30-39.9) 12/18/2022   Anemia 10/29/2022   Anemia due to chronic kidney disease 08/23/2022   Insomnia 08/14/2022   Bradycardia 08/14/2022   Uncontrolled type 2 diabetes mellitus with hypoglycemia, with long-term current use of insulin ([place]) 06/12/2022   Abnormal liver CT 03/22/2022   Melena 01/23/2022   Change in stool caliber 01/23/2022   Lower abdominal pain 12/01/2021   Rectal pain 10/24/2021   Urge incontinence 10/10/2021   Polyneuropathy associated with underlying disease ([place]) 01/06/2021   Chronic kidney disease, stage 4 (severe) ([place]) 01/06/2021   Dementia ([place]) 07/13/2020   Cognitive deficits    Carotid bruit 05/26/2020   Hyperlipidemia associated with type 2 diabetes mellitus ([place]) 12/08/2019   Left knee pain 02/11/2017   Vitamin D deficiency 10/15/2016   Urinary incontinence 10/15/2016   Limited mobility 05/27/2016   Type 2 diabetes mellitus with hyperglycemia ([place]) 02/04/2016   Sleep terror disorder 12/14/2015   Fecal incontinence 12/13/2015   Back pain of lumbar region with sciatica 04/15/2015   Spondylosis, cervical, with myelopathy 04/14/2015   Bilateral leg edema 12/13/2014   PAD (peripheral  artery disease) ([place]) 01/28/2014   OSA (obstructive sleep apnea) 04/10/2013   Peripheral neuropathy 03/12/2013   Hearing loss 11/06/2012   Seizure-like activity ([place]) 01/25/2012   UNSTEADY GAIT 11/07/2010   Alpha thalassemia ([place]) 01/26/2010   Morbid obesity ([place]) 04/21/2008   Anxiety and depression 04/21/2008   Essential hypertension 04/21/2008   GERD 04/21/2008    Osteoarthritis of left knee 04/21/2008   Home Medication(s) Prior to Admission medications   Medication Sig Start Date End Date Taking? Authorizing Provider  Accu-Chek FastClix Lancets MISC 1 each by Does not apply route 4 (four) times daily. Use to monitor glucose levels 4 times daily; E11.65 04/10/22   Brita Romp, NP  Alcohol Swabs (B-D SINGLE USE SWABS REGULAR) PADS 1 each by Does not apply route 4 (four) times daily. Use to prep site for glucose monitoring 4 times daily; E11.65 06/12/19   Renato Shin, MD  amLODipine (NORVASC) 5 MG tablet Take 1.5 tablets (7.5 mg total) by mouth daily. 08/22/22 02/18/23  Arnoldo Lenis, MD  aspirin 81 MG tablet Take 81 mg by mouth daily.    [provider]  atorvastatin (LIPITOR) 40 MG tablet TAKE 1 TABLET BY MOUTH EVERY DAY 10/25/22   Fayrene Helper, MD  benazepril (LOTENSIN) 20 MG tablet TAKE 1 TABLET BY MOUTH EVERY DAY 08/27/22   Fayrene Helper, MD  Blood Glucose Monitoring Suppl (ACCU-CHEK GUIDE ME) w/Device KIT 1 each by Does not apply route 4 (four) times daily. Use to monitor glucose levels 4 times daily; E11.65 06/12/19   Renato Shin, MD  buPROPion (WELLBUTRIN XL) 150 MG 24 hr tablet TAKE 1 TABLET BY MOUTH EVERY MORNING 09/30/22   Cloria Spring, MD  Cholecalciferol 125 MCG (5000 UT) TABS Take 5,000 Units by mouth daily.    [provider]  dicyclomine (BENTYL) 10 MG capsule Take 1 capsule (10 mg total) by mouth every 12 (twelve) hours as needed for spasms. 03/22/22   Harvel Quale, MD  diphenhydrAMINE (BENADRYL) 25 MG tablet Take 25 mg by mouth at bedtime.    [provider]  donepezil (ARICEPT) 10 MG tablet Take 1 tablet (10 mg total) by mouth at bedtime. 11/14/22   Fayrene Helper, MD  gabapentin (NEURONTIN) 400 MG capsule TAKE ONE CAPSULE BY MOUTH EVERY MORNING and TAKE ONE CAPSULE AT NOON and TAKE TWO CAPSULES AT BEDTIME 08/27/22   Fayrene Helper, MD  glucose blood (ACCU-CHEK GUIDE) test  strip USE TO CHECK BLOOD SUGAR FOUR TIMES DAILY 09/07/22   Brita Romp, NP  glucose blood test strip 1 each by Other route in the morning, at noon, in the evening, and at bedtime. Use as instructed qid. E11.65    [provider]  Incontinence Supply Disposable (TRANQUILITY UNDERGARMENTS) MISC 1 each by Does not apply route daily. 10/29/22   Harvel Quale, MD  insulin degludec (TRESIBA FLEXTOUCH) 200 UNIT/ML FlexTouch Pen Inject 12 Units into the skin at bedtime. 05/10/22   Brita Romp, NP  Insulin Pen Needle (EASY TOUCH PEN NEEDLES) 31G X 5 MM MISC USE TO INJECT INSULIN ONCE DAILY 08/14/22   Brita Romp, NP  Insulin Syringe-Needle U-100 (INSULIN SYRINGE 1CC/31GX5/16") 31G X 5/16" 1 ML MISC 1 each by Does not apply route daily. Use to inject insulin daily; E11.65 06/12/19   Renato Shin, MD  Multiple Vitamin (MULTIVITAMIN) capsule Take 1 capsule by mouth daily.    [provider]  NONFORMULARY OR COMPOUNDED ITEM Apothecary hemorrhoid cream.  Apply rectally up to qid as directed.    [provider]  omeprazole (PRILOSEC) 20 MG capsule TAKE ONE CAPSULE BY MOUTH EVERY DAY 10/25/22   Fayrene Helper, MD  sertraline (ZOLOFT) 100 MG tablet TAKE 1 TABLET BY MOUTH DAILY 07/30/22   Cloria Spring, MD  torsemide (DEMADEX) 20 MG tablet TAKE 2 TABLETS BY MOUTH EVERY DAY and additionally TAKE 1 TABLET EVERY OTHER DAY 03/28/22   Arnoldo Lenis, MD  traZODone (DESYREL) 50 MG tablet TAKE 1/2 TABLET BY MOUTH AT BEDTIME FOR SLEEP 11/21/22   Fayrene Helper, MD  UNABLE TO FIND Walker x 1  DX unsteady gait, osteoarthritis of left knee 02/28/17   Fayrene Helper, MD  UNABLE TO FIND Elevated [place] Seat x 1 DX: unsteady gait, back pain, osteoarthritis of left knee 02/28/17   Fayrene Helper, MD  UNABLE TO FIND Standing upright walker x 1  DX M54.40, M19.90 03/01/20   Fayrene Helper, MD  UNABLE TO FIND Incontinence pads and supplies 10/18/20   Noreene Larsson, NP  UNABLE TO FIND Rollator Gilford Rile 12/26/20   Fayrene Helper, MD  UNABLE TO FIND XL Tranquility Briefs for incontinence. 12/26/20   Fayrene Helper, MD  UNABLE TO FIND Premium overnight disposible absorbant underwear XL UPC- [id number] 06/22/22   Fayrene Helper, MD                                                                                                                                    Past Surgical History Past Surgical History:  Procedure Laterality Date   ABDOMINAL HYSTERECTOMY     BREAST EXCISIONAL BIOPSY     Left; cyst   CATARACT EXTRACTION Right    12/2017   CATARACT EXTRACTION W/ INTRAOCULAR LENS IMPLANT Left 09/07/2013   CHOLECYSTECTOMY     COLONOSCOPY     COLONOSCOPY N/A 07/20/2015   Procedure: COLONOSCOPY;  Surgeon: Rogene Houston, MD;  Location: [facility];  Service: Endoscopy;  Laterality: N/A;  930   EYE SURGERY Left 09/07/2013   cataract   Family History Family History  Problem Relation Age of Onset   Lung cancer Mother 62   Kidney disease Father    Diabetes Sister    Keloids Brother    ADD / ADHD Grandchild    Bipolar disorder Grandchild    Bipolar disorder Daughter    Seizures Daughter    Heart disease Daughter    Kidney disease Son    Neuropathy Son    Kidney disease Son    Edema Daughter    Breast cancer Daughter 24   Allergies Daughter    Alcohol abuse Neg Hx    Drug abuse Neg Hx     Social History Social History   Tobacco Use   Smoking status: Never    Passive exposure: Past   Smokeless tobacco: Never  Vaping Use  Vaping Use: Never used  Substance Use Topics   Alcohol use: No    Alcohol/week: 0.0 standard drinks of alcohol   Drug use: No   Allergies Penicillins, Prednisone, Propoxyphene n-acetaminophen, and Sulfa antibiotics  Review of Systems Review of Systems  Cardiovascular:  Positive for leg swelling.  Skin:  Positive for wound.    Physical Exam Vital Signs  I have reviewed the triage vital  signs BP 133/67   Pulse 77   Temp 98.2 F (36.8 C) (Oral)   Resp (!) 22   Wt 118.7 kg   SpO2 95%   BMI 39.79 kg/m   Physical Exam Vitals and nursing note reviewed.  Constitutional:      General: She is not in acute distress.    Appearance: She is well-developed.  HENT:     Head: Normocephalic and atraumatic.  Eyes:     Conjunctiva/sclera: Conjunctivae normal.  Cardiovascular:     Rate and Rhythm: Normal rate and regular rhythm.     Heart sounds: No murmur heard. Pulmonary:     Effort: Pulmonary effort is normal. No respiratory distress.     Breath sounds: Normal breath sounds.  Abdominal:     Palpations: Abdomen is soft.     Tenderness: There is no abdominal tenderness.  Musculoskeletal:        General: Swelling and tenderness present.     Cervical back: Neck supple.  Skin:    General: Skin is warm and dry.     Capillary Refill: Capillary refill takes less than 2 seconds.     Findings: Lesion present.  Neurological:     Mental Status: She is alert.  Psychiatric:        Mood and Affect: Mood normal.     ED Results and Treatments Labs (all labs ordered are listed, but only abnormal results are displayed) Labs Reviewed  COMPREHENSIVE METABOLIC PANEL - Abnormal; Notable for the following components:      Result Value   Potassium 2.3 (*)    Chloride 89 (*)    CO2 35 (*)    Glucose, Bld 207 (*)    BUN 25 (*)    Creatinine, Ser 1.53 (*)    Calcium 8.8 (*)    Albumin 3.0 (*)    AST 45 (*)    ALT 51 (*)    GFR, Estimated 34 (*)    All other components within normal limits  CBC WITH DIFFERENTIAL/PLATELET - Abnormal; Notable for the following components:   Hemoglobin 8.2 (*)    HCT 28.4 (*)    MCV 66.2 (*)    MCH 19.1 (*)    MCHC 28.9 (*)    RDW 16.6 (*)    All other components within normal limits  SEDIMENTATION RATE - Abnormal; Notable for the following components:   Sed Rate 50 (*)    All other components within normal limits  C-REACTIVE PROTEIN -  Abnormal; Notable for the following components:   CRP 13.8 (*)    All other components within normal limits  BRAIN NATRIURETIC PEPTIDE - Abnormal; Notable for the following components:   B Natriuretic Peptide 428.5 (*)    All other components within normal limits  CBG MONITORING, ED - Abnormal; Notable for the following components:   Glucose-Capillary 190 (*)    All other components within normal limits  RENAL FUNCTION PANEL  CBC  Radiology DG CHEST PORT 1 VIEW  Result Date: 12/18/2022 CLINICAL DATA:  Shortness of breath EXAM: PORTABLE CHEST 1 VIEW COMPARISON:  Chest x-ray 05/27/2020 FINDINGS: The heart is mildly enlarged. The lungs are clear. There is no pleural effusion or pneumothorax. No acute fractures are seen. IMPRESSION: Mild cardiomegaly. No acute pulmonary process. Electronically Signed   By: Ronney Asters M.D.   On: 12/18/2022 16:28   DG Foot Complete Right  Result Date: 12/18/2022 CLINICAL DATA:  Osteomyelitis of the great toe. EXAM: RIGHT FOOT COMPLETE - 3 VIEW COMPARISON:  None Available. FINDINGS: Global severe osteopenia. Vascular calcifications. Diffuse soft tissue swelling. Well corticated plantar and Achilles calcaneal spurs. Mild degenerative changes of the midfoot. There is soft tissue swelling about the great toe with erosive changes of the distal tuft of the distal phalanx of the great toe. This would be consistent with patient's history of osteomyelitis. IMPRESSION: Soft tissue swelling particularly of the great toe with bony erosive changes of the distal tuft of the distal phalanx of the great toe. This would be consistent with provided history of osteomyelitis. Global osteopenia with soft tissue swelling, vascular calcifications. Mild degenerative changes. Electronically Signed   By: Jill Side M.D.   On: 12/18/2022 13:18    Pertinent labs & imaging  results that were available during my care of the patient were reviewed by me and considered in my medical decision making (see MDM for details).  Medications Ordered in ED Medications  ceFEPIme (MAXIPIME) 2 g in sodium chloride 0.9 % 100 mL IVPB (has no administration in time range)  vancomycin (VANCOREADY) IVPB 1500 mg/300 mL (has no administration in time range)  insulin glargine-yfgn (SEMGLEE) injection 12 Units (has no administration in time range)  insulin aspart (novoLOG) injection 0-9 Units ( Subcutaneous Not Given 12/18/22 1730)  furosemide (LASIX) injection 60 mg (has no administration in time range)  enoxaparin (LOVENOX) injection 40 mg (has no administration in time range)  ceFEPIme (MAXIPIME) 2 g in sodium chloride 0.9 % 100 mL IVPB (0 g Intravenous Stopped 12/18/22 1358)  vancomycin (VANCOREADY) IVPB 1750 mg/350 mL (0 mg Intravenous Stopped 12/18/22 1630)  potassium chloride SA (KLOR-CON M) CR tablet 40 mEq (40 mEq Oral Given 12/18/22 1417)  potassium chloride 10 mEq in 100 mL IVPB (0 mEq Intravenous Stopped 12/18/22 1731)  magnesium oxide (MAG-OX) tablet 800 mg (800 mg Oral Given 12/18/22 1417)  HYDROcodone-acetaminophen (NORCO/VICODIN) 5-325 MG per tablet 1 tablet (1 tablet Oral Given 12/18/22 1417)  potassium chloride SA (KLOR-CON M) CR tablet 40 mEq (40 mEq Oral Given 12/18/22 1650)                                                                                                                                     Procedures .Critical Care  Performed by: Teressa Lower, MD Authorized by: Teressa Lower, MD   Critical care provider statement:    Critical care time (minutes):  30   Critical care was necessary to treat or prevent imminent or life-threatening deterioration of the following conditions:  Metabolic crisis   Critical care was time spent personally by me on the following activities:  Development of treatment plan with patient or surrogate, discussions with consultants,  evaluation of patient's response to treatment, examination of patient, ordering and review of laboratory studies, ordering and review of radiographic studies, ordering and performing treatments and interventions, pulse oximetry, re-evaluation of patient's condition and review of old charts   (including critical care time)  Medical Decision Making / ED Course   This patient presents to the ED for concern of foot wound, this involves an extensive number of treatment options, and is a complaint that carries with it a high risk of complications and morbidity.  The differential diagnosis includes cellulitis, osteomyelitis, sepsis, peripheral vascular disease, diabetic foot wound  MDM: Patient seen emergency room and for evaluation of a foot wound.  Physical exam with an open wound to the right great first toe as well as a developing ulcer on the left first great toe.  The wound on the right is significantly worse and tender to palpation.  Physical exam also with bilateral lower extremity pitting edema.  Laboratory evaluation concerning with a new significant critical hypokalemia to 2.3 which was aggressively repleted in the emergency department with oral and IV potassium.  Hypochloremia to 89, glucose 207, BUN 25, creatinine 1.53, albumin 3.0, AST 45, ALT 51, hemoglobin 8.2 with an MCV of 66.2.  Sed rate 50, CRP 13.8, BNP 428.5.  Broad-spectrum antibiotics initiated for infected diabetic foot wound.  Chest x-ray unremarkable.  X-ray foot with concern for osteomyelitis.  I spoke with the podiatrist on-call Dr. Blenda Mounts who states that she will see the patient and amputation is likely scheduled for 48 hours on Thursday, 12/20/2022.  Patient require hospital admission for osteomyelitis and patient admitted.   Additional history obtained: -Additional history obtained from multiple family members -External records from outside source obtained and reviewed including: Chart review including previous notes, labs,  imaging, consultation notes   Lab Tests: -I ordered, reviewed, and interpreted labs.   The pertinent results include:   Labs Reviewed  COMPREHENSIVE METABOLIC PANEL - Abnormal; Notable for the following components:      Result Value   Potassium 2.3 (*)    Chloride 89 (*)    CO2 35 (*)    Glucose, Bld 207 (*)    BUN 25 (*)    Creatinine, Ser 1.53 (*)    Calcium 8.8 (*)    Albumin 3.0 (*)    AST 45 (*)    ALT 51 (*)    GFR, Estimated 34 (*)    All other components within normal limits  CBC WITH DIFFERENTIAL/PLATELET - Abnormal; Notable for the following components:   Hemoglobin 8.2 (*)    HCT 28.4 (*)    MCV 66.2 (*)    MCH 19.1 (*)    MCHC 28.9 (*)    RDW 16.6 (*)    All other components within normal limits  SEDIMENTATION RATE - Abnormal; Notable for the following components:   Sed Rate 50 (*)    All other components within normal limits  C-REACTIVE PROTEIN - Abnormal; Notable for the following components:   CRP 13.8 (*)    All other components within normal limits  BRAIN NATRIURETIC PEPTIDE - Abnormal; Notable for the following components:   B Natriuretic Peptide 428.5 (*)    All other components within  normal limits  CBG MONITORING, ED - Abnormal; Notable for the following components:   Glucose-Capillary 190 (*)    All other components within normal limits  RENAL FUNCTION PANEL  CBC      Imaging Studies ordered: I ordered imaging studies including chest x-ray, foot x-ray I independently visualized and interpreted imaging. I agree with the radiologist interpretation   Medicines ordered and prescription drug management: Meds ordered this encounter  Medications   ceFEPIme (MAXIPIME) 2 g in sodium chloride 0.9 % 100 mL IVPB    Order Specific Question:   Antibiotic Indication:    Answer:   Osteomyelitis   vancomycin (VANCOREADY) IVPB 1750 mg/350 mL    Order Specific Question:   Indication:    Answer:   Osteomyelitis   potassium chloride SA (KLOR-CON M) CR  tablet 40 mEq   potassium chloride 10 mEq in 100 mL IVPB   magnesium oxide (MAG-OX) tablet 800 mg   HYDROcodone-acetaminophen (NORCO/VICODIN) 5-325 MG per tablet 1 tablet   ceFEPIme (MAXIPIME) 2 g in sodium chloride 0.9 % 100 mL IVPB    Order Specific Question:   Antibiotic Indication:    Answer:   Osteomyelitis   vancomycin (VANCOREADY) IVPB 1500 mg/300 mL    Order Specific Question:   Indication:    Answer:   Osteomyelitis   DISCONTD: furosemide (LASIX) injection 60 mg   potassium chloride SA (KLOR-CON M) CR tablet 40 mEq   insulin glargine-yfgn (SEMGLEE) injection 12 Units   insulin aspart (novoLOG) injection 0-9 Units    Order Specific Question:   Correction coverage:    Answer:   Sensitive (thin, NPO, renal)    Order Specific Question:   CBG < 70:    Answer:   Implement Hypoglycemia Standing Orders and refer to Hypoglycemia Standing Orders sidebar report    Order Specific Question:   CBG 70 - 120:    Answer:   0 units    Order Specific Question:   CBG 121 - 150:    Answer:   1 unit    Order Specific Question:   CBG 151 - 200:    Answer:   2 units    Order Specific Question:   CBG 201 - 250:    Answer:   3 units    Order Specific Question:   CBG 251 - 300:    Answer:   5 units    Order Specific Question:   CBG 301 - 350:    Answer:   7 units    Order Specific Question:   CBG 351 - 400    Answer:   9 units    Order Specific Question:   CBG > 400    Answer:   call MD and obtain STAT lab verification   furosemide (LASIX) injection 60 mg   enoxaparin (LOVENOX) injection 40 mg    -I have reviewed the patients home medicines and have made adjustments as needed  Critical interventions Electrolyte repletion, broad-spectrum antibiotics  Consultations Obtained: I requested consultation with the podiatrist on-call Dr. Blenda Mounts,  and discussed lab and imaging findings as well as pertinent plan - they recommend: Medicine admission and likely surgical intervention in 48  hours   Cardiac Monitoring: The patient was maintained on a cardiac monitor.  I personally viewed and interpreted the cardiac monitored which showed an underlying rhythm of: NSR  Social Determinants of Health:  Factors impacting patients care include: none   Reevaluation: After the interventions noted above, I reevaluated the patient  and found that they have :stayed the same  Co morbidities that complicate the patient evaluation  Past Medical History:  Diagnosis Date   Alpha thalassemia trait 01/26/2010   02/2012: Nl CBC ex H&H-10.7/34.8, MCV-69    Anemia    Anxiety    Anxiety and depression    Cellulitis 05/09/2017   Depression    Diabetes mellitus    Foot pain, right 04/30/2013   GERD (gastroesophageal reflux disease)    Headache(784.0)    Hyperlipidemia    Hypertension    Iron deficiency 01/21/2017   Microcytic anemia 01/26/2010   02/2012: Nl CBC ex H&H-10.7/34.8, MCV-69    NECK PAIN, CHRONIC 10/21/2008   +chronic back pain     Obesity    Obstructive sleep apnea    Osteoarthritis    Left knee; right shoulder; chronic neck and back pain   Pruritus    PVD (peripheral vascular disease) ([place]) 01/28/2014   Seizures (HCC)    Shoulder pain, right 04/14/2015   Tremor    This started months ago after her seizure progressing to very poor hand writing   Urinary incontinence    UTI (urinary tract infection) 01/18/2013      Dispostion: I considered admission for this patient, and due to osteomyelitis and severe hypokalemia patient will require hospital admission     Final Clinical Impression(s) / ED Diagnoses Final diagnoses:  Osteomyelitis of right foot, unspecified type [facility])     @PCDICTATION @    Teressa Lower, MD 12/18/22 [phone]

## 2022-12-18 NOTE — Progress Notes (Signed)
[facility] night cross cover note:   I was notified by RN of the patient's request for a sleep aid. I subsequently placed order for prn melatonin for insomnia.    Update:   I was notified by RN that AM Hgb found to be 6.9, down slightly from yesterday's result of 8.2.  No overt evidence of active bleed.  Systolic blood pressures most recently in the 130s, with heart rates in the 70s to 80s.  I subsequent placed order to transfuse 1 unit PRBC over 3 hours, along with order for repeat H&H to be checked after completion of this transfusion of 1 unit PRBC.    Babs Bertin, DO Hospitalist

## 2022-12-19 ENCOUNTER — Inpatient Hospital Stay (HOSPITAL_COMMUNITY): Payer: Medicare PPO

## 2022-12-19 DIAGNOSIS — I5031 Acute diastolic (congestive) heart failure: Secondary | ICD-10-CM

## 2022-12-19 DIAGNOSIS — E11649 Type 2 diabetes mellitus with hypoglycemia without coma: Secondary | ICD-10-CM | POA: Diagnosis not present

## 2022-12-19 DIAGNOSIS — Z794 Long term (current) use of insulin: Secondary | ICD-10-CM | POA: Diagnosis not present

## 2022-12-19 DIAGNOSIS — N1832 Chronic kidney disease, stage 3b: Secondary | ICD-10-CM | POA: Diagnosis not present

## 2022-12-19 DIAGNOSIS — I5033 Acute on chronic diastolic (congestive) heart failure: Secondary | ICD-10-CM | POA: Diagnosis not present

## 2022-12-19 DIAGNOSIS — D631 Anemia in chronic kidney disease: Secondary | ICD-10-CM | POA: Diagnosis not present

## 2022-12-19 DIAGNOSIS — M869 Osteomyelitis, unspecified: Secondary | ICD-10-CM | POA: Diagnosis not present

## 2022-12-19 LAB — GLUCOSE, CAPILLARY
Glucose-Capillary: 205 mg/dL — ABNORMAL HIGH (ref 70–99)
Glucose-Capillary: 148 mg/dL — ABNORMAL HIGH (ref 70–99)
Glucose-Capillary: 134 mg/dL — ABNORMAL HIGH (ref 70–99)
Glucose-Capillary: 208 mg/dL — ABNORMAL HIGH (ref 70–99)

## 2022-12-19 LAB — CBC WITH DIFFERENTIAL/PLATELET
Abs Immature Granulocytes: 0.03 10*3/uL (ref 0.00–0.07)
Basophils Absolute: 0 10*3/uL (ref 0.0–0.1)
Basophils Relative: 0 %
Eosinophils Absolute: 0.5 10*3/uL (ref 0.0–0.5)
Eosinophils Relative: 5 %
HCT: 25.4 % — ABNORMAL LOW (ref 36.0–46.0)
Hemoglobin: 7.7 g/dL — ABNORMAL LOW (ref 12.0–15.0)
Immature Granulocytes: 0 %
Lymphocytes Relative: 25 %
Lymphs Abs: 2.2 10*3/uL (ref 0.7–4.0)
MCH: 20.3 pg — ABNORMAL LOW (ref 26.0–34.0)
MCHC: 30.3 g/dL (ref 30.0–36.0)
MCV: 66.8 fL — ABNORMAL LOW (ref 80.0–100.0)
Monocytes Absolute: 0.6 10*3/uL (ref 0.1–1.0)
Monocytes Relative: 6 %
Neutro Abs: 5.6 10*3/uL (ref 1.7–7.7)
Neutrophils Relative %: 64 %
Platelets: 344 10*3/uL (ref 150–400)
RBC: 3.8 MIL/uL — ABNORMAL LOW (ref 3.87–5.11)
RDW: 17.6 % — ABNORMAL HIGH (ref 11.5–15.5)
WBC: 9 10*3/uL (ref 4.0–10.5)
nRBC: 0 % (ref 0.0–0.2)

## 2022-12-19 LAB — PREPARE RBC (CROSSMATCH)

## 2022-12-19 LAB — RENAL FUNCTION PANEL
Albumin: 2.4 g/dL — ABNORMAL LOW (ref 3.5–5.0)
Anion gap: 17 — ABNORMAL HIGH (ref 5–15)
BUN: 26 mg/dL — ABNORMAL HIGH (ref 8–23)
CO2: 34 mmol/L — ABNORMAL HIGH (ref 22–32)
Calcium: 8.4 mg/dL — ABNORMAL LOW (ref 8.9–10.3)
Chloride: 91 mmol/L — ABNORMAL LOW (ref 98–111)
Creatinine, Ser: 1.68 mg/dL — ABNORMAL HIGH (ref 0.44–1.00)
GFR, Estimated: 31 mL/min — ABNORMAL LOW (ref >60)
Glucose, Bld: 213 mg/dL — ABNORMAL HIGH (ref 70–99)
Phosphorus: 3.1 mg/dL (ref 2.5–4.6)
Potassium: 3 mmol/L — ABNORMAL LOW (ref 3.5–5.1)
Sodium: 142 mmol/L (ref 135–145)

## 2022-12-19 LAB — ECHOCARDIOGRAM COMPLETE
AR max vel: 2.12 cm2
AV Area VTI: 2.01 cm2
AV Area mean vel: 2.11 cm2
AV Mean grad: 7 mmHg
AV Peak grad: 11.8 mmHg
Ao pk vel: 1.72 m/s
Area-P 1/2: 3.85 cm2
S' Lateral: 4.1 cm
Weight: 4186.98 oz

## 2022-12-19 LAB — CBC
HCT: 23.9 % — ABNORMAL LOW (ref 36.0–46.0)
Hemoglobin: 6.9 g/dL — CL (ref 12.0–15.0)
MCH: 19.1 pg — ABNORMAL LOW (ref 26.0–34.0)
MCHC: 28.9 g/dL — ABNORMAL LOW (ref 30.0–36.0)
MCV: 66 fL — ABNORMAL LOW (ref 80.0–100.0)
Platelets: 335 10*3/uL (ref 150–400)
RBC: 3.62 MIL/uL — ABNORMAL LOW (ref 3.87–5.11)
RDW: 16.4 % — ABNORMAL HIGH (ref 11.5–15.5)
WBC: 8.4 10*3/uL (ref 4.0–10.5)
nRBC: 0 % (ref 0.0–0.2)

## 2022-12-19 LAB — ABO/RH: ABO/RH(D): AB POS

## 2022-12-19 MED ORDER — TORSEMIDE 20 MG PO TABS
40.0000 mg | ORAL_TABLET | Freq: Every day | ORAL | Status: DC
  Administered 2022-12-19 – 2022-12-24 (×6): 40 mg via ORAL
  Filled 2022-12-19 (×6): qty 2

## 2022-12-19 MED ORDER — GABAPENTIN 400 MG PO CAPS
800.0000 mg | ORAL_CAPSULE | Freq: Every day | ORAL | Status: DC
  Administered 2022-12-19 – 2022-12-23 (×5): 800 mg via ORAL
  Filled 2022-12-19 (×5): qty 2

## 2022-12-19 MED ORDER — BENAZEPRIL HCL 20 MG PO TABS
20.0000 mg | ORAL_TABLET | Freq: Every day | ORAL | Status: DC
  Administered 2022-12-19 – 2022-12-24 (×6): 20 mg via ORAL
  Filled 2022-12-19 (×6): qty 1

## 2022-12-19 MED ORDER — GABAPENTIN 400 MG PO CAPS: 400.0000 mg | ORAL_CAPSULE | ORAL | Status: DC

## 2022-12-19 MED ORDER — SODIUM CHLORIDE 0.9% IV SOLUTION: Freq: Once | INTRAVENOUS | Status: AC

## 2022-12-19 MED ORDER — ATORVASTATIN CALCIUM 40 MG PO TABS
40.0000 mg | ORAL_TABLET | Freq: Every day | ORAL | Status: DC
  Administered 2022-12-19 – 2022-12-24 (×6): 40 mg via ORAL
  Filled 2022-12-19 (×6): qty 1

## 2022-12-19 MED ORDER — AMLODIPINE BESYLATE 5 MG PO TABS
7.5000 mg | ORAL_TABLET | Freq: Every day | ORAL | Status: DC
  Administered 2022-12-19 – 2022-12-24 (×6): 7.5 mg via ORAL
  Filled 2022-12-19 (×7): qty 2

## 2022-12-19 MED ORDER — DONEPEZIL HCL 10 MG PO TABS
10.0000 mg | ORAL_TABLET | Freq: Every day | ORAL | Status: DC
  Administered 2022-12-19 – 2022-12-23 (×5): 10 mg via ORAL
  Filled 2022-12-19 (×5): qty 1

## 2022-12-19 MED ORDER — POTASSIUM CHLORIDE CRYS ER 20 MEQ PO TBCR
40.0000 meq | EXTENDED_RELEASE_TABLET | ORAL | Status: AC
  Administered 2022-12-19 (×2): 40 meq via ORAL
  Filled 2022-12-19 (×2): qty 2

## 2022-12-19 MED ORDER — BUPROPION HCL ER (XL) 150 MG PO TB24
150.0000 mg | ORAL_TABLET | Freq: Every day | ORAL | Status: DC
  Administered 2022-12-19 – 2022-12-24 (×6): 150 mg via ORAL
  Filled 2022-12-19 (×6): qty 1

## 2022-12-19 MED ORDER — GABAPENTIN 400 MG PO CAPS
400.0000 mg | ORAL_CAPSULE | Freq: Two times a day (BID) | ORAL | Status: DC
  Administered 2022-12-19 – 2022-12-24 (×10): 400 mg via ORAL
  Filled 2022-12-19 (×11): qty 1

## 2022-12-19 MED ORDER — PANTOPRAZOLE SODIUM 40 MG PO TBEC
40.0000 mg | DELAYED_RELEASE_TABLET | Freq: Every day | ORAL | Status: DC
  Administered 2022-12-19 – 2022-12-24 (×6): 40 mg via ORAL
  Filled 2022-12-19 (×6): qty 1

## 2022-12-19 MED ORDER — ASPIRIN 81 MG PO CHEW
81.0000 mg | CHEWABLE_TABLET | Freq: Every day | ORAL | Status: DC
  Administered 2022-12-19 – 2022-12-24 (×6): 81 mg via ORAL
  Filled 2022-12-19 (×6): qty 1

## 2022-12-19 MED ORDER — TRAZODONE HCL 50 MG PO TABS
25.0000 mg | ORAL_TABLET | Freq: Every day | ORAL | Status: DC
  Administered 2022-12-19 – 2022-12-22 (×3): 25 mg via ORAL
  Filled 2022-12-19 (×4): qty 1

## 2022-12-19 MED ORDER — SERTRALINE HCL 100 MG PO TABS
100.0000 mg | ORAL_TABLET | Freq: Every day | ORAL | Status: DC
  Administered 2022-12-19 – 2022-12-24 (×6): 100 mg via ORAL
  Filled 2022-12-19 (×6): qty 1

## 2022-12-19 NOTE — Progress Notes (Signed)
Heart Failure Navigator Progress Note  Assessed for Heart & Vascular TOC clinic readiness.  Patient does not meet criteria due to EF 60-65%, with a history of dementia.   Navigator will signoff at this time.    Earnestine Leys, BSN, Clinical cytogeneticist Only

## 2022-12-19 NOTE — Progress Notes (Signed)
PROGRESS NOTE        PATIENT DETAILS Name: ARHIANA BOBIER Age: 82 y.o. Sex: female Date of Birth: [birth year] Admit Date: 12/18/2022 Admitting Physician Norval Morton, MD [id number], Norwood Levo, MD  Brief Summary: Patient is a 82 y.o.  female with history of 8 TN, HLD, DM-2, anxiety/depression-who presented with worsening ulcer in the right great toe-upon further evaluation-she was found to have acute osteomyelitis and subsequently admitted to [facility] service.  Significant events: 3/26>> admit to [facility]  Significant studies: 3/26>> MRI right foot: Osteomyelitis-distal phalanx great toe.  Significant microbiology data: None  Procedures: None  Consults: Podiatry  Subjective: Lying comfortably in bed-denies any chest pain or shortness of breath.  Objective: Vitals: Blood pressure (!) 134/52, pulse 71, temperature 97.9 F (36.6 C), temperature source Oral, resp. rate 16, weight 118.7 kg, SpO2 98 %.   Exam: Gen Exam:Alert awake-not in any distress HEENT:atraumatic, normocephalic Chest: B/L clear to auscultation anteriorly CVS:S1S2 regular Abdomen:soft non tender, non distended Extremities:no edema Neurology: Non focal Skin: no rash  Pertinent Labs/Radiology:    Latest Ref Rng & Units 12/19/2022    2:59 AM 12/18/2022   12:44 PM 12/13/2022    2:43 PM  CBC  WBC 4.0 - 10.5 K/uL 8.4  8.9    Hemoglobin 12.0 - 15.0 g/dL 6.9  8.2  7.3   Hematocrit 36.0 - 46.0 % 23.9  28.4    Platelets 150 - 400 K/uL 335  348      Lab Results  Component Value Date   NA 142 12/19/2022   K 3.0 (L) 12/19/2022   CL 91 (L) 12/19/2022   CO2 34 (H) 12/19/2022      Assessment/Plan: Acute osteomyelitis right great toe Appears unchanged per patient report Afebrile/no leukocytosis-nontoxic-appearing Continue empiric antibiotics Podiatry following with plans for amputation 3/28.  Acute on chronic HFpEF Volume status has improved following IV Lasix Resume Demadex 40  mg p.o. daily Await echo  Hypokalemia Continue to replete/recheck  DM-2 with uncontrolled hyperglycemia CBG stable Continue Semglee 12 units and SSI Follow/assess  Recent Labs    12/18/22 1728 12/18/22 2126 12/19/22 0822  GLUCAP 190* 174* 205*    CKD stage IIIb Close to baseline  Normocytic anemia due to CKD Alpha thalassemia trait Drop in hemoglobin overnight-no overt bleeding-suspect due to acute illness 1 unit of PRBC being transfused-follow CBC  HTN BP stable Continue amlodipine/benazepril-follow  Depression/anxiety Stable Zoloft/Wellbutrin  Dementia Mildly confused this morning but otherwise stable Continue Aricept Delirium precautions.  Morbid Obesity: Estimated body mass index is 39.79 kg/m as calculated from the following:   Height as of 10/29/22: 5\' 8"  (1.727 m).   Weight as of this encounter: 118.7 kg.   Code status:   Code Status: Full Code   DVT Prophylaxis: enoxaparin (LOVENOX) injection 40 mg Start: 12/19/22 1000   Family Communication: Daughter at bedside   Disposition Plan: Status is: Inpatient Remains inpatient appropriate because: Severity of illness   Planned Discharge Destination:Home   Diet: Diet Order             Diet NPO time specified  Diet effective midnight           Diet Heart Room service appropriate? Yes; Fluid consistency: Thin  Diet effective now  Antimicrobial agents: Anti-infectives (From admission, onward)    Start     Dose/Rate Route Frequency Ordered Stop   12/20/22 1430  vancomycin (VANCOREADY) IVPB 1500 mg/300 mL        1,500 mg 150 mL/hr over 120 Minutes Intravenous Every 48 hours 12/18/22 1421     12/19/22 0200  ceFEPIme (MAXIPIME) 2 g in sodium chloride 0.9 % 100 mL IVPB        2 g 200 mL/hr over 30 Minutes Intravenous Every 12 hours 12/18/22 1421     12/18/22 1300  ceFEPIme (MAXIPIME) 2 g in sodium chloride 0.9 % 100 mL IVPB        2 g 200 mL/hr over 30 Minutes  Intravenous  Once 12/18/22 1255 12/18/22 1358   12/18/22 1300  vancomycin (VANCOREADY) IVPB 1750 mg/350 mL        1,750 mg 175 mL/hr over 120 Minutes Intravenous  Once 12/18/22 1256 12/18/22 1630        MEDICATIONS: Scheduled Meds:  sodium chloride   Intravenous Once   enoxaparin (LOVENOX) injection  40 mg Subcutaneous Q24H   insulin aspart  0-9 Units Subcutaneous TID WC   insulin glargine-yfgn  12 Units Subcutaneous QHS   potassium chloride  40 mEq Oral Q4H   Continuous Infusions:  ceFEPime (MAXIPIME) IV 2 g (12/19/22 0230)   [START ON 12/20/2022] vancomycin     PRN Meds:.melatonin   I have personally reviewed following labs and imaging studies  LABORATORY DATA: CBC: Recent Labs  Lab 12/13/22 1443 12/18/22 1244 12/19/22 0259  WBC  --  8.9 8.4  NEUTROABS  --  6.3  --   HGB 7.3* 8.2* 6.9*  HCT  --  28.4* 23.9*  MCV  --  66.2* 66.0*  PLT  --  [id number]    Basic Metabolic Panel: Recent Labs  Lab 12/18/22 1244 12/19/22 0259  NA 139 142  K 2.3* 3.0*  CL 89* 91*  CO2 35* 34*  GLUCOSE 207* 213*  BUN 25* 26*  CREATININE 1.53* 1.68*  CALCIUM 8.8* 8.4*  PHOS  --  3.1    GFR: Estimated Creatinine Clearance: 36.8 mL/min (A) (by C-G formula based on SCr of 1.68 mg/dL (H)).  Liver Function Tests: Recent Labs  Lab 12/18/22 1244 12/19/22 0259  AST 45*  --   ALT 51*  --   ALKPHOS 87  --   BILITOT 0.7  --   PROT 7.3  --   ALBUMIN 3.0* 2.4*   No results for input(s): "LIPASE", "AMYLASE" in the last 168 hours. No results for input(s): "AMMONIA" in the last 168 hours.  Coagulation Profile: No results for input(s): "INR", "PROTIME" in the last 168 hours.  Cardiac Enzymes: No results for input(s): "CKTOTAL", "CKMB", "CKMBINDEX", "TROPONINI" in the last 168 hours.  BNP (last 3 results) No results for input(s): "PROBNP" in the last 8760 hours.  Lipid Profile: No results for input(s): "CHOL", "HDL", "LDLCALC", "TRIG", "CHOLHDL", "LDLDIRECT" in the last 72  hours.  Thyroid Function Tests: No results for input(s): "TSH", "T4TOTAL", "FREET4", "T3FREE", "THYROIDAB" in the last 72 hours.  Anemia Panel: No results for input(s): "VITAMINB12", "FOLATE", "FERRITIN", "TIBC", "IRON", "RETICCTPCT" in the last 72 hours.  Urine analysis:    Component Value Date/Time   COLORURINE YELLOW 05/27/2020 1947   APPEARANCEUR Clear 04/02/2022 1454   LABSPEC 1.010 05/27/2020 1947   PHURINE 5.0 05/27/2020 1947   GLUCOSEU Negative 04/02/2022 1454   GLUCOSEU 500 (A) 08/12/2017 1132   HGBUR SMALL (A)  05/27/2020 1947   BILIRUBINUR Negative 04/02/2022 1454   KETONESUR negative 05/18/2021 [place] 05/27/2020 1947   PROTEINUR Negative 04/02/2022 [place] 05/27/2020 1947   UROBILINOGEN 0.2 05/18/2021 1645   UROBILINOGEN 0.2 08/12/2017 1132   NITRITE Negative 04/02/2022 1454   NITRITE NEGATIVE 05/27/2020 1947   LEUKOCYTESUR Negative 04/02/2022 1454   [place] (A) 05/27/2020 1947    Sepsis Labs: Lactic Acid, Venous    Component Value Date/Time   LATICACIDVEN 0.90 08/19/2017 1731    MICROBIOLOGY: Recent Results (from the past 240 hour(s))  WOUND CULTURE     Status: None (Preliminary result)   Collection Time: 12/17/22  4:16 PM   Specimen: Wound  Result Value Ref Range Status   MICRO NUMBER: [id number]  Preliminary   SPECIMEN QUALITY: Adequate  Preliminary   SOURCE: RIGHT HALLUX  Preliminary   STATUS: PRELIMINARY  Preliminary   GRAM STAIN:   Preliminary    Few White blood cells seen Rare epithelial cells Many Gram positive cocci in pairs Many Gram negative bacilli   RESULT: Culture in progress  Preliminary    RADIOLOGY STUDIES/RESULTS: MR FOOT RIGHT WO CONTRAST  Result Date: 12/18/2022 CLINICAL DATA:  Open wound involving the great toe and heel. EXAM: MRI OF THE RIGHT FOREFOOT WITHOUT CONTRAST TECHNIQUE: Multiplanar, multisequence MR imaging of the right foot was performed. No intravenous contrast was  administered. COMPARISON:  Radiographs, same date. FINDINGS: Diffuse abnormal T1 and T2 signal intensity in the distal phalanx of the great toe consistent with osteomyelitis. No findings suspicious for septic arthritis at the joint space. No MR findings suspicious for osteomyelitis involving the calcaneus. Midfoot degenerative changes but no findings for septic arthritis or osteomyelitis. Diffuse subcutaneous soft tissue swelling/edema suggesting cellulitis. There is also diffuse myofasciitis without findings suspicious for pyomyositis. IMPRESSION: 1. MR findings consistent with osteomyelitis involving the distal phalanx of the great toe. 2. No other definite sites of osteomyelitis. 3. Diffuse cellulitis and myofasciitis without findings for pyomyositis. Electronically Signed   By: Marijo Sanes M.D.   On: 12/18/2022 18:56   DG CHEST PORT 1 VIEW  Result Date: 12/18/2022 CLINICAL DATA:  Shortness of breath EXAM: PORTABLE CHEST 1 VIEW COMPARISON:  Chest x-ray 05/27/2020 FINDINGS: The heart is mildly enlarged. The lungs are clear. There is no pleural effusion or pneumothorax. No acute fractures are seen. IMPRESSION: Mild cardiomegaly. No acute pulmonary process. Electronically Signed   By: Ronney Asters M.D.   On: 12/18/2022 16:28   DG Foot Complete Right  Result Date: 12/18/2022 CLINICAL DATA:  Osteomyelitis of the great toe. EXAM: RIGHT FOOT COMPLETE - 3 VIEW COMPARISON:  None Available. FINDINGS: Global severe osteopenia. Vascular calcifications. Diffuse soft tissue swelling. Well corticated plantar and Achilles calcaneal spurs. Mild degenerative changes of the midfoot. There is soft tissue swelling about the great toe with erosive changes of the distal tuft of the distal phalanx of the great toe. This would be consistent with patient's history of osteomyelitis. IMPRESSION: Soft tissue swelling particularly of the great toe with bony erosive changes of the distal tuft of the distal phalanx of the great toe.  This would be consistent with provided history of osteomyelitis. Global osteopenia with soft tissue swelling, vascular calcifications. Mild degenerative changes. Electronically Signed   By: Jill Side M.D.   On: 12/18/2022 13:18     LOS: 1 day   Oren Binet, MD  [facility]    To contact the attending provider between 7A-7P or the  covering provider during after hours 7P-7A, please log into the web site [email or web address] and access using universal [place] password for that web site. If you do not have the password, please call the hospital operator.  12/19/2022, 9:55 AM

## 2022-12-19 NOTE — Plan of Care (Signed)

## 2022-12-19 NOTE — Care Management (Signed)
  Transition of Care [facility]) Screening Note   Patient Details  Name: LAVELLE SHELBY Date of Birth: 04-29-1941   Transition of [facility]) CM/SW Contact:    Levonne Lapping, RN Phone Number: 12/19/2022, 4:17 PM    Transition of Care Department [facility]) has reviewed patient chart   We will continue to monitor patient advancement through interdisciplinary progression rounds. If new patient transition needs arise, please place a TOC consult.

## 2022-12-19 NOTE — Consult Note (Signed)
[place] Nurse Consult Note: Reason for Consult:bilateral great toe ulcerations, full thickness, R>L. Followed by Podiatric Medicine (Dr. Marilynn Rail) in the community, who recommended admission and amputation of the RGT.Marland Kitchen  Wound type:Infectious, neuropathic Pressure Injury POA: N/A Wound bed:See photo provided by Provider uploaded to the EMR Drainage (amount, consistency, odor) small serosanguinous Periwound: peeling, erythematous, mild edema Dressing procedure/placement/frequency:I have provided Nursing with conservative guidance for the care of this RGT wound using a daily normal saline dressing placed after cleansing and topped with dry gauze and secured with Kerlix. Patient to have surgery this admission.  [place] nursing team will not follow, but will remain available to this patient, the nursing and medical teams.  Please re-consult if needed.  Thank you for inviting [place] to participate in this patient's Plan of Care.  Maudie Flakes, MSN, RN, CNS, [place], Serita Grammes, [employer], [employer] phone:  [phone]

## 2022-12-19 NOTE — H&P (View-Only) (Signed)
Subjective:  Patient ID: Laura Atkins, female    DOB: 09-15-1941,  MRN: [id number]  Patient with past medical history of HTN,DM type 2, anxiety and depression seen at beside today for right great toe wound. She was seen in our office on Monday by Dr. Amalia Hailey and advised to go to the ED for admission, MRI, IV antibiotics and toe amputation. Patient relates doing well this morning without any pain in her toe. Does relates pain to her right heel. Denies n/v/f/c. Marland Kitchen   Past Medical History:  Diagnosis Date   Alpha thalassemia trait 01/26/2010   02/2012: Nl CBC ex H&H-10.7/34.8, MCV-69    Anemia    Anxiety    Anxiety and depression    Cellulitis 05/09/2017   Depression    Diabetes mellitus    Foot pain, right 04/30/2013   GERD (gastroesophageal reflux disease)    Headache(784.0)    Hyperlipidemia    Hypertension    Iron deficiency 01/21/2017   Microcytic anemia 01/26/2010   02/2012: Nl CBC ex H&H-10.7/34.8, MCV-69    NECK PAIN, CHRONIC 10/21/2008   +chronic back pain     Obesity    Obstructive sleep apnea    Osteoarthritis    Left knee; right shoulder; chronic neck and back pain   Pruritus    PVD (peripheral vascular disease) ([place]) 01/28/2014   Seizures (HCC)    Shoulder pain, right 04/14/2015   Tremor    This started months ago after her seizure progressing to very poor hand writing   Urinary incontinence    UTI (urinary tract infection) 01/18/2013     Past Surgical History:  Procedure Laterality Date   ABDOMINAL HYSTERECTOMY     BREAST EXCISIONAL BIOPSY     Left; cyst   CATARACT EXTRACTION Right    12/2017   CATARACT EXTRACTION W/ INTRAOCULAR LENS IMPLANT Left 09/07/2013   CHOLECYSTECTOMY     COLONOSCOPY     COLONOSCOPY N/A 07/20/2015   Procedure: COLONOSCOPY;  Surgeon: Rogene Houston, MD;  Location: [facility];  Service: Endoscopy;  Laterality: N/A;  930   EYE SURGERY Left 09/07/2013   cataract       Latest Ref Rng & Units 12/19/2022    2:59 AM 12/18/2022    12:44 PM 12/13/2022    2:43 PM  CBC  WBC 4.0 - 10.5 K/uL 8.4  8.9    Hemoglobin 12.0 - 15.0 g/dL 6.9  8.2  7.3   Hematocrit 36.0 - 46.0 % 23.9  28.4    Platelets 150 - 400 K/uL 335  348         Latest Ref Rng & Units 12/19/2022    2:59 AM 12/18/2022   12:44 PM 05/01/2022    3:02 PM  BMP  Glucose 70 - 99 mg/dL 213  207  109   BUN 8 - 23 mg/dL 26  25  49   Creatinine 0.44 - 1.00 mg/dL 1.68  1.53  1.71   Sodium 135 - 145 mmol/L 142  139  140   Potassium 3.5 - 5.1 mmol/L 3.0  2.3  4.1   Chloride 98 - 111 mmol/L 91  89  103   CO2 22 - 32 mmol/L 34  35  29   Calcium 8.9 - 10.3 mg/dL 8.4  8.8  9.1      Objective:   Vitals:   12/19/22 0000 12/19/22 0400  BP: (!) 133/49 136/60  Pulse: 79 67  Resp: (!) 21   Temp: 98.1 F (  36.7 C) 98 F (36.7 C)  SpO2: 95% 98%    General:AA&O x 3. Normal mood and affect   Vascular: DP and PT pulses 2/4 bilateral. Brisk capillary refill to all digits. Pedal hair present   Neruological. Epicritic sensation grossly intact.   Derm: Distal right hallux wound with  with necrotic base.  Mild erythema or edema present. No purulence. Does probe to bone. Interspaces clears of maceration. Nails well groomed and normal in appearance    MSK: MMT 5/5 in dorsiflexion, plantar flexion, inversion and eversion. Normal joint ROM without pain or crepitus.    MRI right foot   IMPRESSION: 1. MR findings consistent with osteomyelitis involving the distal phalanx of the great toe. 2. No other definite sites of osteomyelitis. 3. Diffuse cellulitis and myofasciitis without findings for pyomyositis.  Assessment & Plan:  Patient was evaluated and treated and all questions answered.  DX: Right hallux osteomyelitis Wound care: saline WTD, DSD  Antibiotics: Per primary  DME: Patient with early pressure area on her right heel will need to offload this heel. PRAFO boot ordered.   Discussed with patient diagnosis and treatment options.  Imaging reviewed. MRI  showing osteomyelitis of distal great toe. Discussed amputation of the right great toe and patient in agreement. Will plan for this Thursday afternoon and patient to be NPO after midnight tonight.   Patient in agreement with plan and all questions answered.   Lorenda Peck, DPM  Accessible via secure chat for questions or concerns.

## 2022-12-19 NOTE — Progress Notes (Signed)
Arrive to patient room. Patient eating dinner. Notified nurse to consult when patient ready for IV team to place PIV. Fran Lowes, RN VAST

## 2022-12-19 NOTE — Progress Notes (Signed)
Echocardiogram 2D Echocardiogram has been performed.  Laura Atkins 12/19/2022, 10:27 AM

## 2022-12-19 NOTE — Consult Note (Signed)
Subjective:  Patient ID: Laura Atkins, female    DOB: [birth year],  MRN: [id number]  Patient with past medical history of HTN,DM type 2, anxiety and depression seen at beside today for right great toe wound. She was seen in our office on Monday by Dr. Amalia Hailey and advised to go to the ED for admission, MRI, IV antibiotics and toe amputation. Patient relates doing well this morning without any pain in her toe. Does relates pain to her right heel. Denies n/v/f/c. Marland Kitchen   Past Medical History:  Diagnosis Date   Alpha thalassemia trait 01/26/2010   02/2012: Nl CBC ex H&H-10.7/34.8, MCV-69    Anemia    Anxiety    Anxiety and depression    Cellulitis 05/09/2017   Depression    Diabetes mellitus    Foot pain, right 04/30/2013   GERD (gastroesophageal reflux disease)    Headache(784.0)    Hyperlipidemia    Hypertension    Iron deficiency 01/21/2017   Microcytic anemia 01/26/2010   02/2012: Nl CBC ex H&H-10.7/34.8, MCV-69    NECK PAIN, CHRONIC 10/21/2008   +chronic back pain     Obesity    Obstructive sleep apnea    Osteoarthritis    Left knee; right shoulder; chronic neck and back pain   Pruritus    PVD (peripheral vascular disease) ([place]) 01/28/2014   Seizures (HCC)    Shoulder pain, right 04/14/2015   Tremor    This started months ago after her seizure progressing to very poor hand writing   Urinary incontinence    UTI (urinary tract infection) 01/18/2013     Past Surgical History:  Procedure Laterality Date   ABDOMINAL HYSTERECTOMY     BREAST EXCISIONAL BIOPSY     Left; cyst   CATARACT EXTRACTION Right    12/2017   CATARACT EXTRACTION W/ INTRAOCULAR LENS IMPLANT Left 09/07/2013   CHOLECYSTECTOMY     COLONOSCOPY     COLONOSCOPY N/A 07/20/2015   Procedure: COLONOSCOPY;  Surgeon: Rogene Houston, MD;  Location[facility];  Service: Endoscopy;  Laterality: N/A;  930   EYE SURGERY Left 09/07/2013   cataract       Latest Ref Rng & Units 12/19/2022    2:59 AM 12/18/2022    12:44 PM 12/13/2022    2:43 PM  CBC  WBC 4.0 - 10.5 K/uL 8.4  8.9    Hemoglobin 12.0 - 15.0 g/dL 6.9  8.2  7.3   Hematocrit 36.0 - 46.0 % 23.9  28.4    Platelets 150 - 400 K/uL 335  348         Latest Ref Rng & Units 12/19/2022    2:59 AM 12/18/2022   12:44 PM 05/01/2022    3:02 PM  BMP  Glucose 70 - 99 mg/dL 213  207  109   BUN 8 - 23 mg/dL 26  25  49   Creatinine 0.44 - 1.00 mg/dL 1.68  1.53  1.71   Sodium 135 - 145 mmol/L 142  139  140   Potassium 3.5 - 5.1 mmol/L 3.0  2.3  4.1   Chloride 98 - 111 mmol/L 91  89  103   CO2 22 - 32 mmol/L 34  35  29   Calcium 8.9 - 10.3 mg/dL 8.4  8.8  9.1      Objective:   Vitals:   12/19/22 0000 12/19/22 0400  BP: (!) 133/49 136/60  Pulse: 79 67  Resp: (!) 21   Temp: 98.1 F (  36.7 C) 98 F (36.7 C)  SpO2: 95% 98%    General:AA&O x 3. Normal mood and affect   Vascular: DP and PT pulses 2/4 bilateral. Brisk capillary refill to all digits. Pedal hair present   Neruological. Epicritic sensation grossly intact.   Derm: Distal right hallux wound with  with necrotic base.  Mild erythema or edema present. No purulence. Does probe to bone. Interspaces clears of maceration. Nails well groomed and normal in appearance    MSK: MMT 5/5 in dorsiflexion, plantar flexion, inversion and eversion. Normal joint ROM without pain or crepitus.    MRI right foot   IMPRESSION: 1. MR findings consistent with osteomyelitis involving the distal phalanx of the great toe. 2. No other definite sites of osteomyelitis. 3. Diffuse cellulitis and myofasciitis without findings for pyomyositis.  Assessment & Plan:  Patient was evaluated and treated and all questions answered.  DX: Right hallux osteomyelitis Wound care: saline WTD, DSD  Antibiotics: Per primary  DME: Patient with early pressure area on her right heel will need to offload this heel. PRAFO boot ordered.   Discussed with patient diagnosis and treatment options.  Imaging reviewed. MRI  showing osteomyelitis of distal great toe. Discussed amputation of the right great toe and patient in agreement. Will plan for this Thursday afternoon and patient to be NPO after midnight tonight.   Patient in agreement with plan and all questions answered.   Lorenda Peck, DPM  Accessible via secure chat for questions or concerns.

## 2022-12-20 ENCOUNTER — Other Ambulatory Visit: Payer: Self-pay

## 2022-12-20 ENCOUNTER — Inpatient Hospital Stay (HOSPITAL_COMMUNITY): Payer: Medicare PPO

## 2022-12-20 ENCOUNTER — Encounter (HOSPITAL_COMMUNITY)
Admission: EM | Disposition: A | Discharge status: Home Health | Payer: Self-pay | Source: Ambulatory Visit | Attending: Internal Medicine

## 2022-12-20 DIAGNOSIS — M869 Osteomyelitis, unspecified: Secondary | ICD-10-CM

## 2022-12-20 DIAGNOSIS — E1151 Type 2 diabetes mellitus with diabetic peripheral angiopathy without gangrene: Secondary | ICD-10-CM | POA: Diagnosis not present

## 2022-12-20 DIAGNOSIS — F418 Other specified anxiety disorders: Secondary | ICD-10-CM

## 2022-12-20 DIAGNOSIS — E11649 Type 2 diabetes mellitus with hypoglycemia without coma: Secondary | ICD-10-CM | POA: Diagnosis not present

## 2022-12-20 DIAGNOSIS — I10 Essential (primary) hypertension: Secondary | ICD-10-CM

## 2022-12-20 DIAGNOSIS — Z794 Long term (current) use of insulin: Secondary | ICD-10-CM | POA: Diagnosis not present

## 2022-12-20 DIAGNOSIS — E1169 Type 2 diabetes mellitus with other specified complication: Secondary | ICD-10-CM | POA: Diagnosis not present

## 2022-12-20 HISTORY — PX: AMPUTATION TOE: SHX6595

## 2022-12-20 LAB — CBC
HCT: 25.4 % — ABNORMAL LOW (ref 36.0–46.0)
Hemoglobin: 7.7 g/dL — ABNORMAL LOW (ref 12.0–15.0)
MCH: 20.2 pg — ABNORMAL LOW (ref 26.0–34.0)
MCHC: 30.3 g/dL (ref 30.0–36.0)
MCV: 66.5 fL — ABNORMAL LOW (ref 80.0–100.0)
Platelets: 334 10*3/uL (ref 150–400)
RBC: 3.82 MIL/uL — ABNORMAL LOW (ref 3.87–5.11)
RDW: 17.9 % — ABNORMAL HIGH (ref 11.5–15.5)
WBC: 8.7 10*3/uL (ref 4.0–10.5)
nRBC: 0 % (ref 0.0–0.2)

## 2022-12-20 LAB — GLUCOSE, CAPILLARY
Glucose-Capillary: 116 mg/dL — ABNORMAL HIGH (ref 70–99)
Glucose-Capillary: 119 mg/dL — ABNORMAL HIGH (ref 70–99)
Glucose-Capillary: 112 mg/dL — ABNORMAL HIGH (ref 70–99)
Glucose-Capillary: 119 mg/dL — ABNORMAL HIGH (ref 70–99)
Glucose-Capillary: 120 mg/dL — ABNORMAL HIGH (ref 70–99)
Glucose-Capillary: 98 mg/dL (ref 70–99)

## 2022-12-20 LAB — MAGNESIUM: Magnesium: 2.1 mg/dL (ref 1.7–2.4)

## 2022-12-20 LAB — PREPARE RBC (CROSSMATCH)

## 2022-12-20 LAB — BASIC METABOLIC PANEL
Anion gap: 7 (ref 5–15)
BUN: 21 mg/dL (ref 8–23)
CO2: 41 mmol/L — ABNORMAL HIGH (ref 22–32)
Calcium: 8.6 mg/dL — ABNORMAL LOW (ref 8.9–10.3)
Chloride: 93 mmol/L — ABNORMAL LOW (ref 98–111)
Creatinine, Ser: 1.34 mg/dL — ABNORMAL HIGH (ref 0.44–1.00)
GFR, Estimated: 40 mL/min — ABNORMAL LOW (ref >60)
Glucose, Bld: 125 mg/dL — ABNORMAL HIGH (ref 70–99)
Potassium: 3.1 mmol/L — ABNORMAL LOW (ref 3.5–5.1)
Sodium: 141 mmol/L (ref 135–145)

## 2022-12-20 LAB — HEMOGLOBIN AND HEMATOCRIT, BLOOD
HCT: 30.5 % — ABNORMAL LOW (ref 36.0–46.0)
Hemoglobin: 9.1 g/dL — ABNORMAL LOW (ref 12.0–15.0)

## 2022-12-20 SURGERY — AMPUTATION, TOE
Anesthesia: Monitor Anesthesia Care | Site: Toe | Laterality: Right

## 2022-12-20 MED ORDER — ACETAMINOPHEN 10 MG/ML IV SOLN
INTRAVENOUS | Status: AC
  Filled 2022-12-20: qty 100

## 2022-12-20 MED ORDER — ORAL CARE MOUTH RINSE: 15.0000 mL | Freq: Once | OROMUCOSAL | Status: AC

## 2022-12-20 MED ORDER — CHLORHEXIDINE GLUCONATE 0.12 % MT SOLN
15.0000 mL | Freq: Once | OROMUCOSAL | Status: AC
  Administered 2022-12-20: 15 mL via OROMUCOSAL

## 2022-12-20 MED ORDER — LIDOCAINE HCL (PF) 1 % IJ SOLN
INTRAMUSCULAR | Status: AC
  Filled 2022-12-20: qty 30

## 2022-12-20 MED ORDER — CHLORHEXIDINE GLUCONATE CLOTH 2 % EX PADS
6.0000 | MEDICATED_PAD | Freq: Once | CUTANEOUS | Status: AC
  Administered 2022-12-20: 6 via TOPICAL

## 2022-12-20 MED ORDER — LIDOCAINE HCL (PF) 1 % IJ SOLN
INTRAMUSCULAR | Status: DC | PRN
  Administered 2022-12-20: 10 mL via INTRADERMAL

## 2022-12-20 MED ORDER — SODIUM CHLORIDE 0.9 % IR SOLN
Status: DC | PRN
  Administered 2022-12-20: 1000 mL

## 2022-12-20 MED ORDER — POTASSIUM CHLORIDE 10 MEQ/100ML IV SOLN
10.0000 meq | INTRAVENOUS | Status: AC
  Administered 2022-12-20 (×2): 10 meq via INTRAVENOUS
  Filled 2022-12-20 (×2): qty 100

## 2022-12-20 MED ORDER — CHLORHEXIDINE GLUCONATE CLOTH 2 % EX PADS: 6.0000 | MEDICATED_PAD | Freq: Once | CUTANEOUS | Status: AC

## 2022-12-20 MED ORDER — POTASSIUM CHLORIDE CRYS ER 20 MEQ PO TBCR
40.0000 meq | EXTENDED_RELEASE_TABLET | Freq: Once | ORAL | Status: AC
  Administered 2022-12-20: 40 meq via ORAL
  Filled 2022-12-20: qty 2

## 2022-12-20 MED ORDER — FENTANYL CITRATE (PF) 250 MCG/5ML IJ SOLN
INTRAMUSCULAR | Status: AC
  Filled 2022-12-20: qty 5

## 2022-12-20 MED ORDER — LACTATED RINGERS IV SOLN: INTRAVENOUS | Status: DC

## 2022-12-20 MED ORDER — PHENYLEPHRINE HCL-NACL 20-0.9 MG/250ML-% IV SOLN
INTRAVENOUS | Status: AC
  Filled 2022-12-20: qty 250

## 2022-12-20 MED ORDER — PROPOFOL 500 MG/50ML IV EMUL
INTRAVENOUS | Status: DC | PRN
  Administered 2022-12-20: 75 ug/kg/min via INTRAVENOUS

## 2022-12-20 MED ORDER — PROPOFOL 10 MG/ML IV BOLUS
INTRAVENOUS | Status: DC | PRN
  Administered 2022-12-20: 30 mg via INTRAVENOUS

## 2022-12-20 MED ORDER — SODIUM CHLORIDE 0.9% IV SOLUTION: Freq: Once | INTRAVENOUS | Status: DC

## 2022-12-20 SURGICAL SUPPLY — 49 items
BAG COUNTER SPONGE SURGICOUNT (BAG) ×1 IMPLANT
BAG SPNG CNTER NS LX DISP (BAG) ×1
BLADE AVERAGE 25X9 (BLADE) ×1 IMPLANT
BNDG CMPR 5X3 KNIT ELC UNQ LF (GAUZE/BANDAGES/DRESSINGS) ×1
BNDG CMPR 75X21 PLY HI ABS (MISCELLANEOUS) ×1
BNDG CMPR 9X4 STRL LF SNTH (GAUZE/BANDAGES/DRESSINGS) ×1
BNDG ELASTIC 3INX 5YD STR LF (GAUZE/BANDAGES/DRESSINGS) ×1 IMPLANT
BNDG ESMARK 4X9 LF (GAUZE/BANDAGES/DRESSINGS) ×1 IMPLANT
BNDG GAUZE DERMACEA FLUFF 4 (GAUZE/BANDAGES/DRESSINGS) IMPLANT
BNDG GZE DERMACEA 4 6PLY (GAUZE/BANDAGES/DRESSINGS) ×1
CNTNR URN SCR LID CUP LEK RST (MISCELLANEOUS) IMPLANT
CONT SPEC 4OZ STRL OR WHT (MISCELLANEOUS) ×1
CUFF TOURN SGL QUICK 24 (TOURNIQUET CUFF)
CUFF TOURN SGL QUICK 34 (TOURNIQUET CUFF)
CUFF TRNQT CYL 24X4X16.5-23 (TOURNIQUET CUFF) IMPLANT
CUFF TRNQT CYL 34X4.125X (TOURNIQUET CUFF) IMPLANT
DRSG EMULSION OIL 3X3 NADH (GAUZE/BANDAGES/DRESSINGS) IMPLANT
DURAPREP 26ML APPLICATOR (WOUND CARE) ×1 IMPLANT
ELECT REM PT RETURN 9FT ADLT (ELECTROSURGICAL) ×1
ELECTRODE REM PT RTRN 9FT ADLT (ELECTROSURGICAL) ×1 IMPLANT
GAUZE SPONGE 4X4 12PLY STRL (GAUZE/BANDAGES/DRESSINGS) ×1 IMPLANT
GAUZE STRETCH 2X75IN STRL (MISCELLANEOUS) ×1 IMPLANT
GLOVE BIO SURGEON STRL SZ8 (GLOVE) ×1 IMPLANT
GLOVE BIOGEL PI IND STRL 7.5 (GLOVE) IMPLANT
GLOVE INDICATOR 7.5 STRL GRN (GLOVE) IMPLANT
GOWN STRL REUS W/ TWL LRG LVL3 (GOWN DISPOSABLE) ×1 IMPLANT
GOWN STRL REUS W/ TWL XL LVL3 (GOWN DISPOSABLE) ×1 IMPLANT
GOWN STRL REUS W/TWL LRG LVL3 (GOWN DISPOSABLE) ×1
GOWN STRL REUS W/TWL XL LVL3 (GOWN DISPOSABLE) ×1
HANDPIECE INTERPULSE COAX TIP (DISPOSABLE) ×1
KIT BASIN OR (CUSTOM PROCEDURE TRAY) ×1 IMPLANT
KIT TURNOVER KIT B (KITS) ×1 IMPLANT
NDL 18GX1X1/2 (RX/OR ONLY) (NEEDLE) ×1 IMPLANT
NDL HYPO 25X1 1.5 SAFETY (NEEDLE) ×2 IMPLANT
NEEDLE 18GX1X1/2 (RX/OR ONLY) (NEEDLE) ×1 IMPLANT
NEEDLE HYPO 25X1 1.5 SAFETY (NEEDLE) ×2 IMPLANT
NS IRRIG 1000ML POUR BTL (IV SOLUTION) ×1 IMPLANT
PACK ORTHO EXTREMITY (CUSTOM PROCEDURE TRAY) ×1 IMPLANT
PAD ARMBOARD 7.5X6 YLW CONV (MISCELLANEOUS) ×2 IMPLANT
SET HNDPC FAN SPRY TIP SCT (DISPOSABLE) ×1 IMPLANT
SPONGE T-LAP 18X18 ~~LOC~~+RFID (SPONGE) IMPLANT
SUT ETHILON 3 0 PS 1 (SUTURE) ×1 IMPLANT
SUT VIC AB 3-0 FS2 27 (SUTURE) ×1 IMPLANT
SUT VICRYL 4-0 PS2 18IN ABS (SUTURE) IMPLANT
SWAB CULTURE ESWAB REG 1ML (MISCELLANEOUS) ×1 IMPLANT
SYR CONTROL 10ML LL (SYRINGE) ×1 IMPLANT
TUBE CONNECTING 20X1/4 (TUBING) IMPLANT
WATER STERILE IRR 1000ML POUR (IV SOLUTION) ×1 IMPLANT
YANKAUER SUCT BULB TIP NO VENT (SUCTIONS) IMPLANT

## 2022-12-20 NOTE — Anesthesia Preprocedure Evaluation (Addendum)
Anesthesia Evaluation  Patient identified by MRN, date of birth, ID band Patient confused    Reviewed: Allergy & Precautions, H&P , NPO status , Patient's Chart, lab work & pertinent test results  History of Anesthesia Complications Negative for: history of anesthetic complications  Airway Mallampati: I  TM Distance: >3 FB Neck ROM: Full    Dental  (+) Dental Advisory Given, Upper Dentures   Pulmonary neg pulmonary ROS, sleep apnea    breath sounds clear to auscultation       Cardiovascular Exercise Tolerance: Good hypertension, Pt. on medications + Peripheral Vascular Disease  + Valvular Problems/Murmurs AI and MR  Rhythm:Regular     Neuro/Psych  Headaches, Seizures -,  PSYCHIATRIC DISORDERS Anxiety Depression   Dementia  Neuromuscular disease negative neurological ROS  negative psych ROS   GI/Hepatic Neg liver ROS,GERD  Medicated,,  Endo/Other  diabetes, Type 2    Renal/GU Renal disease     Musculoskeletal  (+) Arthritis ,    Abdominal   Peds  Hematology negative hematology ROS (+) Blood dyscrasia, anemia   Anesthesia Other Findings   Reproductive/Obstetrics                             Anesthesia Physical Anesthesia Plan  ASA: 4  Anesthesia Plan: MAC   Post-op Pain Management: Minimal or no pain anticipated and Ofirmev IV (intra-op)*   Induction: Intravenous  PONV Risk Score and Plan: 2 and Treatment may vary due to age or medical condition and Propofol infusion  Airway Management Planned: Mask, Natural Airway and Simple Face Mask  Additional Equipment: None  Intra-op Plan:   Post-operative Plan:   Informed Consent: I have reviewed the patients History and Physical, chart, labs and discussed the procedure including the risks, benefits and alternatives for the proposed anesthesia with the patient or authorized representative who has indicated his/her understanding and  acceptance.       Plan Discussed with: Anesthesiologist and CRNA  Anesthesia Plan Comments: (ECHO 3/24 1. Left ventricular ejection fraction, by estimation, is 50 to 55%. The left ventricle has low normal function. The left ventricle has no regional wall motion abnormalities. Left ventricular diastolic parameters are consistent with Grade II diastolic dysfunction (pseudonormalization).  2. Right ventricular systolic function is normal. The right ventricular size is mildly enlarged.  3. Left atrial size was mildly dilated.  4. The mitral valve is normal in structure. Mild to moderate mitral valve regurgitation. No evidence of mitral stenosis.  5. Tricuspid valve regurgitation is mild to moderate.  6. The aortic valve is normal in structure. Aortic valve regurgitation is mild. No aortic stenosis is present.  7. The inferior vena cava is normal in size with greater than 50% respiratory variability, suggesting right atrial pressure of 3 mmHg. )        Anesthesia Quick Evaluation

## 2022-12-20 NOTE — Care Management Important Message (Signed)
Important Message  Patient Details  Name: Laura Atkins MRN: [id number] Date of Birth: 1940/11/13   Medicare Important Message Given:  Yes     Orbie Pyo 12/20/2022, 3:25 PM

## 2022-12-20 NOTE — Interval H&P Note (Signed)
History and Physical Interval Note:  12/20/2022 4:21 PM  Laura Atkins  has presented today for surgery, with the diagnosis of Right great toe osteomyelitis.  The various methods of treatment have been discussed with the patient and family. After consideration of risks, benefits and other options for treatment, the patient has consented to  Procedure(s): AMPUTATION TOE (Right) as a surgical intervention.  The patient's history has been reviewed, patient examined, no change in status, stable for surgery.  I have reviewed the patient's chart and labs.  Questions were answered to the patient's satisfaction.     Lorenda Peck

## 2022-12-20 NOTE — Anesthesia Procedure Notes (Signed)
Procedure Name: MAC Date/Time: 12/20/2022 4:35 PM  Performed by: Maude Leriche, CRNAPre-anesthesia Checklist: Patient identified, Emergency Drugs available, Suction available, Patient being monitored and Timeout performed Patient Re-evaluated:Patient Re-evaluated prior to induction Oxygen Delivery Method: Simple face mask Placement Confirmation: positive ETCO2 Dental Injury: Teeth and Oropharynx as per pre-operative assessment

## 2022-12-20 NOTE — Progress Notes (Signed)
PROGRESS NOTE        PATIENT DETAILS Name: BONNITA GLATTER Age: 82 y.o. Sex: female Date of Birth: [birth year] Admit Date: 12/18/2022 Admitting Physician Norval Morton, MD [id number], Norwood Levo, MD  Brief Summary: Patient is a 82 y.o.  female with history of 8 TN, HLD, DM-2, anxiety/depression-who presented with worsening ulcer in the right great toe-upon further evaluation-she was found to have acute osteomyelitis and subsequently admitted to [facility] service.  Significant events: 3/26>> admit to [facility]  Significant studies: 3/26>> MRI right foot: Osteomyelitis-distal phalanx great toe. 3/27>> echo: EF [id number], grade 2 diastolic dysfunction.  Significant microbiology data: None  Procedures: None  Consults: Podiatry  Subjective: Seen earlier this morning-lying comfortably in bed-no major issues overnight.  Daughter was sleeping in a recliner at bedside.  Objective: Vitals: Blood pressure (!) 154/50, pulse 70, temperature 97.8 F (36.6 C), temperature source Axillary, resp. rate 12, weight 114.2 kg, SpO2 93 %.   Exam: Gen Exam:Alert awake-not in any distress HEENT:atraumatic, normocephalic Chest: B/L clear to auscultation anteriorly CVS:S1S2 regular Abdomen:soft non tender, non distended Extremities:no edema Neurology: Non focal Skin: no rash  Pertinent Labs/Radiology:    Latest Ref Rng & Units 12/20/2022    4:21 AM 12/19/2022    5:27 PM 12/19/2022    2:59 AM  CBC  WBC 4.0 - 10.5 K/uL 8.7  9.0  8.4   Hemoglobin 12.0 - 15.0 g/dL 7.7  7.7  6.9   Hematocrit 36.0 - 46.0 % 25.4  25.4  23.9   Platelets 150 - 400 K/uL 334  344  335     Lab Results  Component Value Date   NA 141 12/20/2022   K 3.1 (L) 12/20/2022   CL 93 (L) 12/20/2022   CO2 41 (H) 12/20/2022      Assessment/Plan: Acute osteomyelitis right great toe No major issues overnight Afebrile No leukocytosis Remains on empiric IV antibiotics Podiatry planning for amputation  later today.    Acute on chronic HFpEF Significant improvement in volume status Given IV Lasix initially-but has been transitioned to oral Demadex  Hypokalemia Continue to replete/recheck.  DM-2 with uncontrolled hyperglycemia CBG stable Continue Semglee 12 units and SSI Follow/assess  Recent Labs    12/19/22 2117 12/20/22 0818 12/20/22 1130  GLUCAP 208* 116* 119*     CKD stage IIIb Close to baseline  Normocytic anemia due to CKD Alpha thalassemia trait Transfused 1 unit of PRBC on admission Hemoglobin currently stable at 7.7-which is close to her usual baseline Follow CBC.  HTN BP stable Continue amlodipine/benazepril-follow  Depression/anxiety Stable Zoloft/Wellbutrin  Dementia Mildly confused this morning but otherwise stable Continue Aricept Delirium precautions.  Morbid Obesity: Estimated body mass index is 38.28 kg/m as calculated from the following:   Height as of 10/29/22: 5\' 8"  (1.727 m).   Weight as of this encounter: 114.2 kg.   Code status:   Code Status: Full Code   DVT Prophylaxis: SCD's Start: 12/20/22 0732 enoxaparin (LOVENOX) injection 40 mg Start: 12/19/22 1000   Family Communication: Daughter at bedside on 3/28   Disposition Plan: Status is: Inpatient Remains inpatient appropriate because: Severity of illness   Planned Discharge Destination:Home   Diet: Diet Order             Diet NPO time specified Except for: Sips with Meds  Diet effective midnight  Antimicrobial agents: Anti-infectives (From admission, onward)    Start     Dose/Rate Route Frequency Ordered Stop   12/20/22 1430  vancomycin (VANCOREADY) IVPB 1500 mg/300 mL        1,500 mg 150 mL/hr over 120 Minutes Intravenous Every 48 hours 12/18/22 1421     12/19/22 0200  ceFEPIme (MAXIPIME) 2 g in sodium chloride 0.9 % 100 mL IVPB        2 g 200 mL/hr over 30 Minutes Intravenous Every 12 hours 12/18/22 1421     12/18/22 1300  ceFEPIme  (MAXIPIME) 2 g in sodium chloride 0.9 % 100 mL IVPB        2 g 200 mL/hr over 30 Minutes Intravenous  Once 12/18/22 1255 12/18/22 1358   12/18/22 1300  vancomycin (VANCOREADY) IVPB 1750 mg/350 mL        1,750 mg 175 mL/hr over 120 Minutes Intravenous  Once 12/18/22 1256 12/18/22 1630        MEDICATIONS: Scheduled Meds:  amLODipine  7.5 mg Oral Daily   aspirin  81 mg Oral Daily   atorvastatin  40 mg Oral Daily   benazepril  20 mg Oral Daily   buPROPion  150 mg Oral Daily   donepezil  10 mg Oral QHS   enoxaparin (LOVENOX) injection  40 mg Subcutaneous Q24H   gabapentin  400 mg Oral BID   And   gabapentin  800 mg Oral QHS   insulin aspart  0-9 Units Subcutaneous TID WC   insulin glargine-yfgn  12 Units Subcutaneous QHS   pantoprazole  40 mg Oral Daily   sertraline  100 mg Oral Daily   torsemide  40 mg Oral Daily   traZODone  25 mg Oral QHS   Continuous Infusions:  ceFEPime (MAXIPIME) IV 2 g (12/20/22 0257)   potassium chloride 10 mEq (12/20/22 1055)   vancomycin     PRN Meds:.melatonin   I have personally reviewed following labs and imaging studies  LABORATORY DATA: CBC: Recent Labs  Lab 12/13/22 1443 12/18/22 1244 12/19/22 0259 12/19/22 1727 12/20/22 0421  WBC  --  8.9 8.4 9.0 8.7  NEUTROABS  --  6.3  --  5.6  --   HGB 7.3* 8.2* 6.9* 7.7* 7.7*  HCT  --  28.4* 23.9* 25.4* 25.4*  MCV  --  66.2* 66.0* 66.8* 66.5*  PLT  --  348 335 344 334     Basic Metabolic Panel: Recent Labs  Lab 12/18/22 1244 12/19/22 0259 12/20/22 0421  NA 139 142 141  K 2.3* 3.0* 3.1*  CL 89* 91* 93*  CO2 35* 34* 41*  GLUCOSE 207* 213* 125*  BUN 25* 26* 21  CREATININE 1.53* 1.68* 1.34*  CALCIUM 8.8* 8.4* 8.6*  MG  --   --  2.1  PHOS  --  3.1  --      GFR: Estimated Creatinine Clearance: 45.1 mL/min (A) (by C-G formula based on SCr of 1.34 mg/dL (H)).  Liver Function Tests: Recent Labs  Lab 12/18/22 1244 12/19/22 0259  AST 45*  --   ALT 51*  --   ALKPHOS 87  --    BILITOT 0.7  --   PROT 7.3  --   ALBUMIN 3.0* 2.4*    No results for input(s): "LIPASE", "AMYLASE" in the last 168 hours. No results for input(s): "AMMONIA" in the last 168 hours.  Coagulation Profile: No results for input(s): "INR", "PROTIME" in the last 168 hours.  Cardiac Enzymes: No results for  input(s): "CKTOTAL", "CKMB", "CKMBINDEX", "TROPONINI" in the last 168 hours.  BNP (last 3 results) No results for input(s): "PROBNP" in the last 8760 hours.  Lipid Profile: No results for input(s): "CHOL", "HDL", "LDLCALC", "TRIG", "CHOLHDL", "LDLDIRECT" in the last 72 hours.  Thyroid Function Tests: No results for input(s): "TSH", "T4TOTAL", "FREET4", "T3FREE", "THYROIDAB" in the last 72 hours.  Anemia Panel: No results for input(s): "VITAMINB12", "FOLATE", "FERRITIN", "TIBC", "IRON", "RETICCTPCT" in the last 72 hours.  Urine analysis:    Component Value Date/Time   COLORURINE YELLOW 05/27/2020 1947   APPEARANCEUR Clear 04/02/2022 1454   LABSPEC 1.010 05/27/2020 1947   PHURINE 5.0 05/27/2020 1947   GLUCOSEU Negative 04/02/2022 1454   GLUCOSEU 500 (A) 08/12/2017 1132   HGBUR SMALL (A) 05/27/2020 1947   BILIRUBINUR Negative 04/02/2022 1454   KETONESUR negative 05/18/2021 [place] 05/27/2020 1947   PROTEINUR Negative 04/02/2022 1454   PROTEINUR NEGATIVE 05/27/2020 1947   UROBILINOGEN 0.2 05/18/2021 1645   UROBILINOGEN 0.2 08/12/2017 1132   NITRITE Negative 04/02/2022 1454   NITRITE NEGATIVE 05/27/2020 1947   LEUKOCYTESUR Negative 04/02/2022 1454   [place] (A) 05/27/2020 1947    Sepsis Labs: Lactic Acid, Venous    Component Value Date/Time   LATICACIDVEN 0.90 08/19/2017 1731    MICROBIOLOGY: Recent Results (from the past 240 hour(s))  WOUND CULTURE     Status: None (Preliminary result)   Collection Time: 12/17/22  4:16 PM   Specimen: Wound  Result Value Ref Range Status   MICRO NUMBER: [id number]  Preliminary   SPECIMEN QUALITY:  Adequate  Preliminary   SOURCE: RIGHT HALLUX  Preliminary   STATUS: PRELIMINARY  Preliminary   GRAM STAIN:   Preliminary    Few White blood cells seen Rare epithelial cells Many Gram positive cocci in pairs Many Gram negative bacilli   RESULT: Culture in progress  Preliminary    RADIOLOGY STUDIES/RESULTS: ECHOCARDIOGRAM COMPLETE  Result Date: 12/19/2022    ECHOCARDIOGRAM REPORT   Patient Name:   KARAGAN SIRKIN Reppond Date of Exam: 12/19/2022 Medical Rec #:  [id number]       Height:       68.0 in Accession #:    [id number]      Weight:       261.7 lb Date of Birth:  [birth year]       BSA:          2.291 m Patient Age:    50 years        BP:           136/60 mmHg Patient Gender: F               HR:           74 bpm. Exam Location:  Inpatient Procedure: 2D Echo, Cardiac Doppler and Color Doppler Indications:    CHF-Acute Diastolic [id number]  History:        Patient has prior history of Echocardiogram examinations, most                 recent 05/28/2020. CHF, PAD, Arrythmias:Bradycardia; Risk                 Factors:Hypertension, Diabetes, Dyslipidemia and Sleep Apnea.                 CKD, stage 4.  Sonographer:    Ronny Flurry Referring Phys: [id number] RONDELL A SMITH IMPRESSIONS  1. Left ventricular ejection fraction, by estimation, is 50 to 55%. The left ventricle  has low normal function. The left ventricle has no regional wall motion abnormalities. Left ventricular diastolic parameters are consistent with Grade II diastolic dysfunction (pseudonormalization).  2. Right ventricular systolic function is normal. The right ventricular size is mildly enlarged.  3. Left atrial size was mildly dilated.  4. The mitral valve is normal in structure. Mild to moderate mitral valve regurgitation. No evidence of mitral stenosis.  5. Tricuspid valve regurgitation is mild to moderate.  6. The aortic valve is normal in structure. Aortic valve regurgitation is mild. No aortic stenosis is present.  7. The inferior vena cava is normal in  size with greater than 50% respiratory variability, suggesting right atrial pressure of 3 mmHg. FINDINGS  Left Ventricle: Left ventricular ejection fraction, by estimation, is 50 to 55%. The left ventricle has low normal function. The left ventricle has no regional wall motion abnormalities. The left ventricular internal cavity size was normal in size. There is no left ventricular hypertrophy. Left ventricular diastolic parameters are consistent with Grade II diastolic dysfunction (pseudonormalization). Right Ventricle: The right ventricular size is mildly enlarged. No increase in right ventricular wall thickness. Right ventricular systolic function is normal. Left Atrium: Left atrial size was mildly dilated. Right Atrium: Right atrial size was normal in size. Pericardium: There is no evidence of pericardial effusion. Presence of epicardial fat layer. Mitral Valve: The mitral valve is normal in structure. Mild to moderate mitral annular calcification. Mild to moderate mitral valve regurgitation. No evidence of mitral valve stenosis. Tricuspid Valve: The tricuspid valve is normal in structure. Tricuspid valve regurgitation is mild to moderate. No evidence of tricuspid stenosis. Aortic Valve: The aortic valve is normal in structure. Aortic valve regurgitation is mild. No aortic stenosis is present. Aortic valve mean gradient measures 7.0 mmHg. Aortic valve peak gradient measures 11.8 mmHg. Aortic valve area, by VTI measures 2.01  cm. Pulmonic Valve: The pulmonic valve was normal in structure. Pulmonic valve regurgitation is trivial. No evidence of pulmonic stenosis. Aorta: The aortic root is normal in size and structure. Venous: The inferior vena cava is normal in size with greater than 50% respiratory variability, suggesting right atrial pressure of 3 mmHg. IAS/Shunts: No atrial level shunt detected by color flow Doppler.  LEFT VENTRICLE PLAX 2D LVIDd:         5.60 cm   Diastology LVIDs:         4.10 cm   LV e'  medial:    5.22 cm/s LV PW:         0.70 cm   LV E/e' medial:  30.1 LV IVS:        0.90 cm   LV e' lateral:   8.16 cm/s LVOT diam:     2.10 cm   LV E/e' lateral: 19.2 LV SV:         87 LV SV Index:   38 LVOT Area:     3.46 cm  RIGHT VENTRICLE             IVC RV S prime:     11.70 cm/s  IVC diam: 2.50 cm TAPSE (M-mode): 2.7 cm LEFT ATRIUM           Index        RIGHT ATRIUM           Index LA diam:      5.00 cm 2.18 cm/m   RA Area:     20.50 cm LA Vol (A2C): 72.9 ml 31.82 ml/m  RA Volume:  56.10 ml  24.49 ml/m LA Vol (A4C): 88.4 ml 38.58 ml/m  AORTIC VALVE AV Area (Vmax):    2.12 cm AV Area (Vmean):   2.11 cm AV Area (VTI):     2.01 cm AV Vmax:           172.00 cm/s AV Vmean:          120.000 cm/s AV VTI:            0.432 m AV Peak Grad:      11.8 mmHg AV Mean Grad:      7.0 mmHg LVOT Vmax:         105.33 cm/s LVOT Vmean:        73.267 cm/s LVOT VTI:          0.251 m LVOT/AV VTI ratio: 0.58  AORTA Ao Root diam: 3.40 cm Ao Asc diam:  3.00 cm MITRAL VALVE                TRICUSPID VALVE MV Area (PHT): 3.85 cm     TR Peak grad:   45.4 mmHg MV Decel Time: 197 msec     TR Vmax:        337.00 cm/s MV E velocity: 157.00 cm/s MV A velocity: 110.00 cm/s  SHUNTS MV E/A ratio:  1.43         Systemic VTI:  0.25 m                             Systemic Diam: 2.10 cm Kardie Tobb DO Electronically signed by Berniece Salines DO Signature Date/Time: 12/19/2022/12:26:12 PM    Final    MR FOOT RIGHT WO CONTRAST  Result Date: 12/18/2022 CLINICAL DATA:  Open wound involving the great toe and heel. EXAM: MRI OF THE RIGHT FOREFOOT WITHOUT CONTRAST TECHNIQUE: Multiplanar, multisequence MR imaging of the right foot was performed. No intravenous contrast was administered. COMPARISON:  Radiographs, same date. FINDINGS: Diffuse abnormal T1 and T2 signal intensity in the distal phalanx of the great toe consistent with osteomyelitis. No findings suspicious for septic arthritis at the joint space. No MR findings suspicious for osteomyelitis  involving the calcaneus. Midfoot degenerative changes but no findings for septic arthritis or osteomyelitis. Diffuse subcutaneous soft tissue swelling/edema suggesting cellulitis. There is also diffuse myofasciitis without findings suspicious for pyomyositis. IMPRESSION: 1. MR findings consistent with osteomyelitis involving the distal phalanx of the great toe. 2. No other definite sites of osteomyelitis. 3. Diffuse cellulitis and myofasciitis without findings for pyomyositis. Electronically Signed   By: Marijo Sanes M.D.   On: 12/18/2022 18:56   DG CHEST PORT 1 VIEW  Result Date: 12/18/2022 CLINICAL DATA:  Shortness of breath EXAM: PORTABLE CHEST 1 VIEW COMPARISON:  Chest x-ray 05/27/2020 FINDINGS: The heart is mildly enlarged. The lungs are clear. There is no pleural effusion or pneumothorax. No acute fractures are seen. IMPRESSION: Mild cardiomegaly. No acute pulmonary process. Electronically Signed   By: Ronney Asters M.D.   On: 12/18/2022 16:28   DG Foot Complete Right  Result Date: 12/18/2022 CLINICAL DATA:  Osteomyelitis of the great toe. EXAM: RIGHT FOOT COMPLETE - 3 VIEW COMPARISON:  None Available. FINDINGS: Global severe osteopenia. Vascular calcifications. Diffuse soft tissue swelling. Well corticated plantar and Achilles calcaneal spurs. Mild degenerative changes of the midfoot. There is soft tissue swelling about the great toe with erosive changes of the distal tuft of the distal phalanx of the great toe. This would  be consistent with patient's history of osteomyelitis. IMPRESSION: Soft tissue swelling particularly of the great toe with bony erosive changes of the distal tuft of the distal phalanx of the great toe. This would be consistent with provided history of osteomyelitis. Global osteopenia with soft tissue swelling, vascular calcifications. Mild degenerative changes. Electronically Signed   By: Jill Side M.D.   On: 12/18/2022 13:18     LOS: 2 days   Oren Binet, MD  [facility]    To contact the attending provider between 7A-7P or the covering provider during after hours 7P-7A, please log into the web site [email or web address] and access using universal [place] password for that web site. If you do not have the password, please call the hospital operator.  12/20/2022, 11:35 AM

## 2022-12-20 NOTE — Transfer of Care (Signed)
Immediate Anesthesia Transfer of Care Note  Patient: Laura Atkins  Procedure(s) Performed: AMPUTATION TOE (Right: Toe)  Patient Location: PACU  Anesthesia Type:MAC  Level of Consciousness: awake, alert , patient cooperative, and confused  Airway & Oxygen Therapy: Patient Spontanous Breathing  Post-op Assessment: Report given to RN, Post -op Vital signs reviewed and stable, Patient moving all extremities X 4, and Patient able to stick tongue midline  Post vital signs: Reviewed  Last Vitals:  Vitals Value Taken Time  BP 134/52 12/20/22 1709  Temp 37.3 C 12/20/22 1709  Pulse 74 12/20/22 1709  Resp 17 12/20/22 1709  SpO2 96 % 12/20/22 1709  Vitals shown include unvalidated device data.  Last Pain:  Vitals:   12/20/22 1600  TempSrc: Oral  PainSc:          Complications: No notable events documented.

## 2022-12-20 NOTE — Op Note (Signed)
   OPERATIVE REPORT Patient name: Laura Atkins MRN: [id number] DOB: January 13, 1941  DOS:  12/20/22  Preop Dx: Osteomyelitis of right foot, unspecified type ([place]) Postop Dx: same  Procedure:  1. Right great toe partial amputation   Surgeon: Lorenda Peck, DPM  Anesthesia:  1% lidocaine plain 10cc infiltrated in the patient's right lower extremity  Hemostasis: No TQ necessary   EBL: <5 mL Materials: None Injectables: As above Pathology: right great toe for pathology and right residual wound swab for culture   Condition: The patient tolerated the procedure and anesthesia well. No complications noted or reported   Justification for procedure: The patient is a 82 y.o. female who presents today for treatment of right great toe osteomyelitis  All conservative modalities of been unsuccessful in providing any sort of satisfactory alleviation of symptoms with the patient. The patient was told benefits as well as possible side effects of the surgery. The patient consented for surgical correction. The patient consent form was reviewed. All patient questions were answered. No guarantees were expressed or implied. The patient and the surgeon both signed the patient consent form with the witness present and placed in the patient's chart.   Procedure in Detail: The patient was brought to the operating room, placed in the operating table in the supine position at which time an aseptic scrub and drape were performed about the patient's respective lower extremity after anesthesia was induced as described above. Attention was then directed to the surgical area where procedure number one commenced.  Procedure #1:   Attention was directed to the right first digit were an incision was preformed encircling the base of the toe. Incision was made down to bone and digit was amputated at the level of the interphalangeal joint. After the toe was disarticulated it was passed off to the back table to be sent to  pathology for further evaluation. The extensor and flexor tendons were identified and resected as far proximally as possible. Any necrotic tissue was removed to healthy bleeding tissue. The residual phalanx was identified and noted to be hard. The area was irrigated copiously sterile saline and residual bone swab was sent to microbiology. Any bleeders noted were cauterized as necessary. The skin was re-approximated utilizing 3-0 nylon suture.   Dry sterile compressive dressings were then applied to all previously mentioned incision sites about the patient's lower extremity. The patient was then transferred from the operating room to the recovery room having tolerated the procedure and anesthesia well. All vital signs are stable. After a brief stay in the recovery room the patient was readmitted to the floor.    Disposition:  Patient is ok for discharge from podiatry standpoint. Recommend discharge on 1 week of oral antibiotics. She is to keep dressing clean dry and intact until follow-up with podiatry in about a week. Will be heel weight bearing in surgical shoe.    Lorenda Peck, DPM [facility]  Dr. Lorenda Peck, DPM    [address] [place], [place] [address]                Office [phone]  Fax [phone]

## 2022-12-20 NOTE — Brief Op Note (Signed)
12/18/2022 - 12/20/2022  4:22 PM  PATIENT:  Laura Atkins  82 y.o. female  PRE-OPERATIVE DIAGNOSIS:  Right great toe osteomyelitis  POST-OPERATIVE DIAGNOSIS:  * No post-op diagnosis entered *  PROCEDURE:  Procedure(s): AMPUTATION TOE (Right)  SURGEON:  Surgeon(s) and Role:    * Lorenda Peck, DPM - Primary  PHYSICIAN ASSISTANT:   ASSISTANTS: none   ANESTHESIA:   local and MAC  EBL:  Minimal  BLOOD ADMINISTERED:none  DRAINS: none   LOCAL MEDICATIONS USED:  LIDOCAINE 10cc  SPECIMEN:  Source of Specimen:  Right great toe for pathology, culture swab of residual wound  DISPOSITION OF SPECIMEN:  PATHOLOGY and culture   COUNTS:  YES  TOURNIQUET:  * Missing tourniquet times found for documented tourniquets in log: [id number] *  DICTATION: .Note written in EPIC  PLAN OF CARE: Admit to inpatient   PATIENT DISPOSITION:  PACU - hemodynamically stable.   Delay start of Pharmacological VTE agent (>24hrs) due to surgical blood loss or risk of bleeding: no

## 2022-12-21 ENCOUNTER — Encounter (HOSPITAL_COMMUNITY): Payer: Self-pay | Admitting: Nephrology

## 2022-12-21 ENCOUNTER — Other Ambulatory Visit (HOSPITAL_COMMUNITY): Payer: Self-pay

## 2022-12-21 ENCOUNTER — Encounter (HOSPITAL_COMMUNITY): Payer: Self-pay | Admitting: Podiatry

## 2022-12-21 DIAGNOSIS — M869 Osteomyelitis, unspecified: Secondary | ICD-10-CM | POA: Diagnosis not present

## 2022-12-21 DIAGNOSIS — Z794 Long term (current) use of insulin: Secondary | ICD-10-CM | POA: Diagnosis not present

## 2022-12-21 DIAGNOSIS — I5033 Acute on chronic diastolic (congestive) heart failure: Secondary | ICD-10-CM | POA: Diagnosis not present

## 2022-12-21 DIAGNOSIS — E876 Hypokalemia: Secondary | ICD-10-CM | POA: Diagnosis not present

## 2022-12-21 DIAGNOSIS — E11649 Type 2 diabetes mellitus with hypoglycemia without coma: Secondary | ICD-10-CM | POA: Diagnosis not present

## 2022-12-21 LAB — TYPE AND SCREEN
ABO/RH(D): AB POS
Antibody Screen: NEGATIVE
Unit division: 0
Unit division: 0

## 2022-12-21 LAB — BASIC METABOLIC PANEL
Anion gap: 10 (ref 5–15)
BUN: 19 mg/dL (ref 8–23)
CO2: 38 mmol/L — ABNORMAL HIGH (ref 22–32)
Calcium: 8.7 mg/dL — ABNORMAL LOW (ref 8.9–10.3)
Chloride: 93 mmol/L — ABNORMAL LOW (ref 98–111)
Creatinine, Ser: 1.26 mg/dL — ABNORMAL HIGH (ref 0.44–1.00)
GFR, Estimated: 43 mL/min — ABNORMAL LOW (ref >60)
Glucose, Bld: 81 mg/dL (ref 70–99)
Potassium: 3.8 mmol/L (ref 3.5–5.1)
Sodium: 141 mmol/L (ref 135–145)

## 2022-12-21 LAB — WOUND CULTURE
MICRO NUMBER:: 14736858
SPECIMEN QUALITY:: ADEQUATE

## 2022-12-21 LAB — BPAM RBC
Blood Product Expiration Date: 202404022359
Blood Product Expiration Date: 202404202359
ISSUE DATE / TIME: 202403270931
ISSUE DATE / TIME: 202403281558
Unit Type and Rh: 6200
Unit Type and Rh: 7300

## 2022-12-21 LAB — GLUCOSE, CAPILLARY
Glucose-Capillary: 146 mg/dL — ABNORMAL HIGH (ref 70–99)
Glucose-Capillary: 107 mg/dL — ABNORMAL HIGH (ref 70–99)
Glucose-Capillary: 145 mg/dL — ABNORMAL HIGH (ref 70–99)
Glucose-Capillary: 143 mg/dL — ABNORMAL HIGH (ref 70–99)
Glucose-Capillary: 159 mg/dL — ABNORMAL HIGH (ref 70–99)
Glucose-Capillary: 165 mg/dL — ABNORMAL HIGH (ref 70–99)

## 2022-12-21 MED ORDER — DOXYCYCLINE HYCLATE 100 MG PO TABS
100.0000 mg | ORAL_TABLET | Freq: Two times a day (BID) | ORAL | 0 refills | Status: DC
  Filled 2022-12-21: qty 14, 7d supply, fill #0

## 2022-12-21 MED ORDER — ACETAMINOPHEN 325 MG PO TABS
650.0000 mg | ORAL_TABLET | Freq: Four times a day (QID) | ORAL | Status: DC | PRN
  Administered 2022-12-21 – 2022-12-22 (×2): 650 mg via ORAL
  Filled 2022-12-21 (×2): qty 2

## 2022-12-21 MED ORDER — CEFADROXIL 500 MG PO CAPS
1000.0000 mg | ORAL_CAPSULE | Freq: Two times a day (BID) | ORAL | 0 refills | Status: DC
  Filled 2022-12-21: qty 28, 7d supply, fill #0

## 2022-12-21 NOTE — Anesthesia Postprocedure Evaluation (Signed)
Anesthesia Post Note  Patient: Laura Atkins  Procedure(s) Performed: RIGHT TOE AMPUTATION TOE (Right: Toe)     Patient location during evaluation: PACU Anesthesia Type: MAC Level of consciousness: awake and alert Pain management: pain level controlled Vital Signs Assessment: post-procedure vital signs reviewed and stable Respiratory status: spontaneous breathing, nonlabored ventilation and respiratory function stable Cardiovascular status: stable and blood pressure returned to baseline Postop Assessment: no apparent nausea or vomiting Anesthetic complications: no   No notable events documented.  Last Vitals:  Vitals:   12/21/22 0528 12/21/22 0832  BP: (!) 148/55 (!) 147/55  Pulse: 74 77  Resp: 17 (!) 21  Temp: 36.8 C 36.7 C  SpO2:  97%    Last Pain:  Vitals:   12/21/22 0832  TempSrc: Oral  PainSc:                  Dewitte Vannice

## 2022-12-21 NOTE — NC FL2 (Signed)
[place] LEVEL OF CARE FORM     IDENTIFICATION  Patient Name: Laura Atkins Birthdate: Jan 24, 1941 Sex: female Admission Date (Current Location): 12/18/2022  [facility] and [place] Number:  [employer] and Address:  [facility] [place], [place] [address] [place], [place] [address]      Provider Number: [id number]  Attending Physician Name and Address:  Jonetta Osgood, MD  Relative Name and Phone Number:       Current Level of Care: Hospital Recommended Level of Care: [place] Prior Approval Number:    Date Approved/Denied:   PASRR Number: [id number]  Discharge Plan: SNF    Current Diagnoses: Patient Active Problem List   Diagnosis Date Noted   Osteomyelitis ([place]) 12/18/2022   Hypokalemia 12/18/2022   Heart failure with preserved ejection fraction ([place]) 12/18/2022   Chronic kidney disease, stage III (moderate) ([place]) 12/18/2022   Obesity (BMI 30-39.9) 12/18/2022   Anemia 10/29/2022   Anemia due to chronic kidney disease 08/23/2022   Insomnia 08/14/2022   Bradycardia 08/14/2022   Uncontrolled type 2 diabetes mellitus with hypoglycemia, with long-term current use of insulin ([place]) 06/12/2022   Abnormal liver CT 03/22/2022   Melena 01/23/2022   Change in stool caliber 01/23/2022   Lower abdominal pain 12/01/2021   Rectal pain 10/24/2021   Urge incontinence 10/10/2021   Polyneuropathy associated with underlying disease ([place]) 01/06/2021   Chronic kidney disease, stage 4 (severe) ([place]) 01/06/2021   Dementia ([place]) 07/13/2020   Cognitive deficits    Carotid bruit 05/26/2020   Hyperlipidemia associated with type 2 diabetes mellitus ([place]) 12/08/2019   Left knee pain 02/11/2017   Vitamin D deficiency 10/15/2016   Urinary incontinence 10/15/2016   Limited mobility 05/27/2016   Type 2 diabetes mellitus with hyperglycemia ([place]) 02/04/2016   Sleep terror disorder 12/14/2015   Fecal incontinence 12/13/2015   Back  pain of lumbar region with sciatica 04/15/2015   Spondylosis, cervical, with myelopathy 04/14/2015   Bilateral leg edema 12/13/2014   PAD (peripheral artery disease) ([place]) 01/28/2014   OSA (obstructive sleep apnea) 04/10/2013   Peripheral neuropathy 03/12/2013   Hearing loss 11/06/2012   Seizure-like activity ([place]) 01/25/2012   UNSTEADY GAIT 11/07/2010   Alpha thalassemia ([place]) 01/26/2010   Morbid obesity ([place]) 04/21/2008   Anxiety and depression 04/21/2008   Essential hypertension 04/21/2008   GERD 04/21/2008   Osteoarthritis of left knee 04/21/2008    Orientation RESPIRATION BLADDER Height & Weight     Self, Time, Situation, Place  Normal Incontinent, External catheter Weight: 251 lb 12.3 oz (114.2 kg) Height:     BEHAVIORAL SYMPTOMS/MOOD NEUROLOGICAL BOWEL NUTRITION STATUS      Continent Diet (See DC Summary)  AMBULATORY STATUS COMMUNICATION OF NEEDS Skin   Extensive Assist Verbally Surgical wounds (Closed incision on toe and foot)                       Personal Care Assistance Level of Assistance  Bathing, Feeding, Dressing Bathing Assistance: Maximum assistance Feeding assistance: Independent Dressing Assistance: Limited assistance     Functional Limitations Info             SPECIAL CARE FACTORS FREQUENCY  PT (By licensed PT), OT (By licensed OT)     PT Frequency: 5x/week OT Frequency: 5x/week            Contractures Contractures Info: Not present    Additional Factors Info  Code Status, Allergies Code Status Info:  Full Allergies Info: Penicillins, Prednisone, Propoxyphene N-acetaminophen, Gentamicin, Sulfa Antibiotics           Current Medications (12/21/2022):  This is the current hospital active medication list Current Facility-Administered Medications  Medication Dose Route Frequency Provider Last Rate Last Admin   0.9 %  sodium chloride infusion (Manually program via Guardrails IV Fluids)   Intravenous Once Lorenda Peck, DPM        acetaminophen (TYLENOL) tablet 650 mg  650 mg Oral Q6H PRN Jonetta Osgood, MD   650 mg at 12/21/22 0905   amLODipine (NORVASC) tablet 7.5 mg  7.5 mg Oral Daily Lorenda Peck, DPM   7.5 mg at 12/21/22 [id number]   aspirin chewable tablet 81 mg  81 mg Oral Daily Lorenda Peck, DPM   81 mg at 12/21/22 0834   atorvastatin (LIPITOR) tablet 40 mg  40 mg Oral Daily Lorenda Peck, DPM   40 mg at 12/21/22 0834   benazepril (LOTENSIN) tablet 20 mg  20 mg Oral Daily Lorenda Peck, DPM   20 mg at 12/21/22 0849   buPROPion (WELLBUTRIN XL) 24 hr tablet 150 mg  150 mg Oral Daily Lorenda Peck, DPM   150 mg at 12/21/22 0834   ceFEPIme (MAXIPIME) 2 g in sodium chloride 0.9 % 100 mL IVPB  2 g Intravenous Q12H Lorenda Peck, DPM 200 mL/hr at 12/21/22 1454 2 g at 12/21/22 1454   donepezil (ARICEPT) tablet 10 mg  10 mg Oral QHS Lorenda Peck, DPM   10 mg at 12/20/22 2151   enoxaparin (LOVENOX) injection 40 mg  40 mg Subcutaneous Q24H Lorenda Peck, DPM   40 mg at 12/21/22 0834   gabapentin (NEURONTIN) capsule 400 mg  400 mg Oral BID Lorenda Peck, DPM   400 mg at 12/21/22 1253   And   gabapentin (NEURONTIN) capsule 800 mg  800 mg Oral QHS Lorenda Peck, DPM   800 mg at 12/20/22 2150   insulin aspart (novoLOG) injection 0-9 Units  0-9 Units Subcutaneous TID WC Lorenda Peck, DPM   1 Units at 12/21/22 1254   insulin glargine-yfgn (SEMGLEE) injection 12 Units  12 Units Subcutaneous QHS Lorenda Peck, DPM   12 Units at 12/20/22 2151   melatonin tablet 3 mg  3 mg Oral QHS PRN Lorenda Peck, DPM   3 mg at 12/21/22 0205   pantoprazole (PROTONIX) EC tablet 40 mg  40 mg Oral Daily Lorenda Peck, DPM   40 mg at 12/21/22 0834   sertraline (ZOLOFT) tablet 100 mg  100 mg Oral Daily Lorenda Peck, DPM   100 mg at 12/21/22 0834   torsemide (DEMADEX) tablet 40 mg  40 mg Oral Daily Lorenda Peck, DPM   40 mg at 12/21/22 0834   traZODone (DESYREL) tablet 25 mg  25 mg Oral QHS Lorenda Peck, DPM   25 mg at  12/20/22 2150   vancomycin (VANCOREADY) IVPB 1500 mg/300 mL  1,500 mg Intravenous Q48H Lorenda Peck, DPM 150 mL/hr at 12/20/22 1518 1,500 mg at 12/20/22 1518     Discharge Medications: Please see discharge summary for a list of discharge medications.  Relevant Imaging Results:  Relevant Lab Results:   Additional Information SS#: [id number]  Benard Halsted, LCSW

## 2022-12-21 NOTE — Progress Notes (Signed)
Subjective:  Patient ID: Laura Atkins, female    DOB: [birth year],  MRN: [id number]  Patient seen this morning POD#1 s/p right great toe partial amputation  Past Medical History:  Diagnosis Date   Alpha thalassemia trait 01/26/2010   02/2012: Nl CBC ex H&H-10.7/34.8, MCV-69    Anemia    Anxiety    Anxiety and depression    Cellulitis 05/09/2017   Depression    Diabetes mellitus    Foot pain, right 04/30/2013   GERD (gastroesophageal reflux disease)    Headache(784.0)    Hyperlipidemia    Hypertension    Iron deficiency 01/21/2017   Microcytic anemia 01/26/2010   02/2012: Nl CBC ex H&H-10.7/34.8, MCV-69    NECK PAIN, CHRONIC 10/21/2008   +chronic back pain     Obesity    Obstructive sleep apnea    Osteoarthritis    Left knee; right shoulder; chronic neck and back pain   Pruritus    PVD (peripheral vascular disease) ([place]) 01/28/2014   Seizures (HCC)    Shoulder pain, right 04/14/2015   Tremor    This started months ago after her seizure progressing to very poor hand writing   Urinary incontinence    UTI (urinary tract infection) 01/18/2013     Past Surgical History:  Procedure Laterality Date   ABDOMINAL HYSTERECTOMY     AMPUTATION TOE Right 12/20/2022   Procedure: RIGHT TOE AMPUTATION TOE;  Surgeon: Lorenda Peck, DPM;  Location: [facility];  Service: Podiatry;  Laterality: Right;   BREAST EXCISIONAL BIOPSY     Left; cyst   CATARACT EXTRACTION Right    12/2017   CATARACT EXTRACTION W/ INTRAOCULAR LENS IMPLANT Left 09/07/2013   CHOLECYSTECTOMY     COLONOSCOPY     COLONOSCOPY N/A 07/20/2015   Procedure: COLONOSCOPY;  Surgeon: Rogene Houston, MD;  [facility];  Service: Endoscopy;  Laterality: N/A;  930   EYE SURGERY Left 09/07/2013   cataract       Latest Ref Rng & Units 12/20/2022    9:59 PM 12/20/2022    4:21 AM 12/19/2022    5:27 PM  CBC  WBC 4.0 - 10.5 K/uL  8.7  9.0   Hemoglobin 12.0 - 15.0 g/dL 9.1  7.7  7.7   Hematocrit 36.0 - 46.0 % 30.5   25.4  25.4   Platelets 150 - 400 K/uL  334  344        Latest Ref Rng & Units 12/21/2022    6:52 AM 12/20/2022    4:21 AM 12/19/2022    2:59 AM  BMP  Glucose 70 - 99 mg/dL 81  125  213   BUN 8 - 23 mg/dL 19  21  26    Creatinine 0.44 - 1.00 mg/dL 1.26  1.34  1.68   Sodium 135 - 145 mmol/L 141  141  142   Potassium 3.5 - 5.1 mmol/L 3.8  3.1  3.0   Chloride 98 - 111 mmol/L 93  93  91   CO2 22 - 32 mmol/L 38  41  34   Calcium 8.9 - 10.3 mg/dL 8.7  8.6  8.4      Objective:   Vitals:   12/21/22 0528 12/21/22 0832  BP: (!) 148/55 (!) 147/55  Pulse: 74 77  Resp: 17 (!) 21  Temp: 98.2 F (36.8 C) 98.1 F (36.7 C)  SpO2:  97%    General:AA&O x 3. Normal mood and affect   Vascular: DP and PT pulses  2/4 bilateral. Brisk capillary refill to all digits. Pedal hair present   Neruological. Epicritic sensation grossly intact.   Derm: dressing clean dry and intact with no strikethrough noted.   MSK: MMT 5/5 in dorsiflexion, plantar flexion, inversion and eversion. Normal joint ROM without pain or crepitus.       Assessment & Plan:  Patient was evaluated and treated and all questions answered.  DX: s/p right partial great toe amputation.  Patient is ok for discharge from podiatry standpoint. Recommend discharge on 1 week of oral antibiotics. She is to keep dressing clean dry and intact until follow-up with podiatry in about a week. Will be heel weight bearing in surgical shoe.   Lorenda Peck, DPM  Accessible via secure chat for questions or concerns.

## 2022-12-21 NOTE — Evaluation (Signed)
Physical Therapy Evaluation Patient Details Name: Laura Atkins MRN: [id number] DOB: 05/20/1941 Today's Date: 12/21/2022  History of Present Illness  Patient is a 82 y.o. female admitted to [facility] on 3/26 for worsening ulcer on R great toe. Further evaluation revealed osteomyelitis, no s/p R great toe amputation on 3/28. PMHx: HTN, HLD, DM-2, anxiety/depression, dementia  Clinical Impression  Pt presents today after R great toe amputation, now heel weight bearing through RLE, requiring increased assistance for mobility and functioning below her baseline due to strength, balance, and activity tolerance deficits. At baseline pt requires minA for sit>stand with assist navigating her RW with ambulation, all assist provided by her daughter. Today pt required modA for bed mobility and was able to stand despite max-TotalA provided with several attempts. Pt was abel to scoot along the EOB with increased time but unable to achieve standing. Pt will continue to benefit from skilled acute PT to progress mobility and address deficits, recommend low intensity skilled PT at discharge prior to returning home to progress independence with mobility. Acute PT will continue to follow during admission.        Recommendations for follow up therapy are one component of a multi-disciplinary discharge planning process, led by the attending physician.  Recommendations may be updated based on patient status, additional functional criteria and insurance authorization.  Follow Up Recommendations Can patient physically be transported by private vehicle: No     Assistance Recommended at Discharge Frequent or constant Supervision/Assistance  Patient can return home with the following  Two people to help with walking and/or transfers;Assist for transportation;Help with stairs or ramp for entrance    Equipment Recommendations Other (comment) (defer to next level)  Recommendations for Other Services       Functional Status  Assessment Patient has had a recent decline in their functional status and demonstrates the ability to make significant improvements in function in a reasonable and predictable amount of time.     Precautions / Restrictions Precautions Precautions: Fall Required Braces or Orthoses: Other Brace Other Brace: R post op shoe Restrictions Weight Bearing Restrictions: Yes RLE Weight Bearing: Partial weight bearing RLE Partial Weight Bearing Percentage or Pounds: heel weight bearing Other Position/Activity Restrictions: heel weight bearing with post op shoe      Mobility  Bed Mobility Overal bed mobility: Needs Assistance Bed Mobility: Supine to Sit, Sit to Supine     Supine to sit: Mod assist, HOB elevated Sit to supine: Mod assist, HOB elevated   General bed mobility comments: increased time and cueing for sequencing and attention to task, for modA for supine>sit for trunk support, modA for return to supine for BLE management    Transfers Overall transfer level: Needs assistance Equipment used: 1 person hand held assist Transfers: Sit to/from Stand Sit to Stand: Total assist           General transfer comment: attempted x4 trials to stand with blocking of B feet, only achieving very minimal bottom clearance. Pt able to scoot along EOB with increased time and cueing    Ambulation/Gait               General Gait Details: unable to achieve standing today  Stairs            Wheelchair Mobility    Modified Rankin (Stroke Patients Only)       Balance Overall balance assessment: Needs assistance Sitting-balance support: Bilateral upper extremity supported, Feet supported, No upper extremity supported Sitting balance-Leahy Scale: Fair Sitting balance -  Comments: varying from BUE to no UE support, mild sway but sitting with close guard     Standing balance-Leahy Scale: Zero Standing balance comment: unable to achieve standing today                              Pertinent Vitals/Pain Pain Assessment Pain Assessment: Faces Faces Pain Scale: Hurts little more Pain Location: R foot Pain Descriptors / Indicators: Sore, Grimacing Pain Intervention(s): Monitored during session, Limited activity within patient's tolerance    Home Living Family/patient expects to be discharged to:: Private residence Living Arrangements: Children;Spouse/significant other Available Help at Discharge: Family;Available 24 hours/day Type of Home: House Home Access: Ramped entrance       Home Layout: One level Home Equipment: Rolling Walker (2 wheels);Grab bars - toilet;Electric scooter;BSC/3in1 Additional Comments: Daughter present throughout and providing history    Prior Function Prior Level of Function : Needs assist  Cognitive Assist : Mobility (cognitive);ADLs (cognitive) Mobility (Cognitive): Step by step cues ADLs (Cognitive): Step by step cues       Mobility Comments: assist from daughter for sit<>stand transfers and navigating RW in the home ADLs Comments: requires cues for set up when eating and for bathing and UB dressing, max A with LB dressing     Hand Dominance   Dominant Hand: Right    Extremity/Trunk Assessment   Upper Extremity Assessment Upper Extremity Assessment: Defer to OT evaluation    Lower Extremity Assessment Lower Extremity Assessment: Generalized weakness    Cervical / Trunk Assessment Cervical / Trunk Assessment: Kyphotic  Communication   Communication: No difficulties  Cognition Arousal/Alertness: Awake/alert Behavior During Therapy: Flat affect Overall Cognitive Status: History of cognitive impairments - at baseline                                 General Comments: pt oriented to herself and birthday and place with increased time and cueing. Able to correctly state year with options but unable to state month. Easily distracted throughout session and requiring cueing to stay on task         General Comments General comments (skin integrity, edema, etc.): VSS on room air    Exercises     Assessment/Plan    PT Assessment Patient needs continued PT services  PT Problem List Decreased strength;Decreased activity tolerance;Decreased balance;Decreased mobility;Decreased cognition;Decreased knowledge of use of DME;Decreased safety awareness;Decreased knowledge of precautions       PT Treatment Interventions DME instruction;Gait training;Functional mobility training;Therapeutic activities;Therapeutic exercise;Balance training;Neuromuscular re-education;Cognitive remediation;Patient/family education    PT Goals (Current goals can be found in the Care Plan section)  Acute Rehab PT Goals Patient Stated Goal: get better PT Goal Formulation: With patient/family Time For Goal Achievement: 01/04/23 Potential to Achieve Goals: Fair    Frequency Min 2X/week     Co-evaluation               AM-PAC PT "6 Clicks" Mobility  Outcome Measure Help needed turning from your back to your side while in a flat bed without using bedrails?: A Lot Help needed moving from lying on your back to sitting on the side of a flat bed without using bedrails?: A Lot Help needed moving to and from a bed to a chair (including a wheelchair)?: Total Help needed standing up from a chair using your arms (e.g., wheelchair or bedside chair)?: Total Help needed to walk  in hospital room?: Total Help needed climbing 3-5 steps with a railing? : Total 6 Click Score: 8    End of Session Equipment Utilized During Treatment: Gait belt Activity Tolerance: Patient tolerated treatment well Patient left: in bed;with call bell/phone within reach;with family/visitor present Nurse Communication: Mobility status PT Visit Diagnosis: Muscle weakness (generalized) (M62.81);Difficulty in walking, not elsewhere classified (R26.2)    Time: [id number] PT Time Calculation (min) (ACUTE ONLY): 25 min   Charges:   PT  Evaluation $PT Eval Moderate Complexity: 1 Mod          Charlynne Cousins, PT DPT Acute Rehabilitation Services Office [phone]   Luvenia Heller 12/21/2022, 3:56 PM

## 2022-12-21 NOTE — TOC Progression Note (Signed)
Discharge medications (2) are being stored in the main pharmacy on the ground floor until patient is ready for discharge.   

## 2022-12-21 NOTE — Progress Notes (Signed)
Orthopedic Tech Progress Note Patient Details:  Laura Atkins Mar 01, 1941 [id number]  Ortho Devices Type of Ortho Device: Postop shoe/boot Ortho Device/Splint Location: RLE Ortho Device/Splint Interventions: Ordered   Post Interventions Patient Tolerated: Well Instructions Provided: Care of device, Adjustment of device  [place] A [name] 12/21/2022, 10:12 AM

## 2022-12-21 NOTE — Progress Notes (Addendum)
PROGRESS NOTE        PATIENT DETAILS Name: Laura Atkins Age: 82 y.o. Sex: female Date of Birth: 1941-05-23 Admit Date: 12/18/2022 Admitting Physician Norval Morton, MD [id number], Norwood Levo, MD  Brief Summary: Patient is a 82 y.o.  female with history of 8 TN, HLD, DM-2, anxiety/depression-who presented with worsening ulcer in the right great toe-upon further evaluation-she was found to have acute osteomyelitis and subsequently admitted to [facility] service.  Significant events: 3/26>> admit to [facility]  Significant studies: 3/26>> MRI right foot: Osteomyelitis-distal phalanx great toe. 3/27>> echo: EF [id number], grade 2 diastolic dysfunction.  Significant microbiology data: None  Procedures: 3/28>> right great toe amputation by podiatry.  Consults: Podiatry  Subjective: No major issues overnight-lying comfortably in bed.  Daughter at bedside  Objective: Vitals: Blood pressure (!) 147/55, pulse 77, temperature 98.1 F (36.7 C), temperature source Oral, resp. rate (!) 21, weight 114.2 kg, SpO2 97 %.   Exam: Gen Exam:Alert awake-not in any distress HEENT:atraumatic, normocephalic Chest: B/L clear to auscultation anteriorly CVS:S1S2 regular Abdomen:soft non tender, non distended Extremities:no edema Neurology: Non focal Skin: no rash  Pertinent Labs/Radiology:    Latest Ref Rng & Units 12/20/2022    9:59 PM 12/20/2022    4:21 AM 12/19/2022    5:27 PM  CBC  WBC 4.0 - 10.5 K/uL  8.7  9.0   Hemoglobin 12.0 - 15.0 g/dL 9.1  7.7  7.7   Hematocrit 36.0 - 46.0 % 30.5  25.4  25.4   Platelets 150 - 400 K/uL  334  344     Lab Results  Component Value Date   NA 141 12/21/2022   K 3.8 12/21/2022   CL 93 (L) 12/21/2022   CO2 38 (H) 12/21/2022      Assessment/Plan: Acute osteomyelitis right great toe No major issues overnight S/p right toe amputation on 3/28 Continue IV antibiotics-will plan on transitioning to oral antibiotics on  discharge. Evaluated by PT/OT-recommendations are for short-term SNF  Acute on chronic HFpEF Significant improvement in volume status Given IV Lasix initially-but has been transitioned to oral Demadex  Hypokalemia Repleted.  DM-2 with uncontrolled hyperglycemia CBG stable Continue Semglee 12 units and SSI Follow/assess  Recent Labs    12/20/22 1815 12/20/22 2132 12/21/22 0830  GLUCAP 98 146* 107*     CKD stage IIIb Close to baseline  Normocytic anemia due to CKD Alpha thalassemia trait Transfused 1 unit of PRBC on admission-last hemoglobin on 3/28 stable at 9.1. Follow CBC periodically  HTN BP stable Continue amlodipine/benazepril-follow  Depression/anxiety Stable Zoloft/Wellbutrin  Dementia Mildly confused this morning but otherwise stable Continue Aricept Delirium precautions.  Morbid Obesity: Estimated body mass index is 38.28 kg/m as calculated from the following:   Height as of 10/29/22: 5\' 8"  (1.727 m).   Weight as of this encounter: 114.2 kg.   Code status:   Code Status: Full Code   DVT Prophylaxis: enoxaparin (LOVENOX) injection 40 mg Start: 12/19/22 1000   Family Communication: Daughter at bedside on 3/29   Disposition Plan: Status is: Inpatient Remains inpatient appropriate because: Severity of illness   Planned Discharge Destination:SNF   Diet: Diet Order             Diet - low sodium heart healthy           Diet Carb Modified  Diet Carb Modified Fluid consistency: Thin; Room service appropriate? Yes  Diet effective now                     Antimicrobial agents: Anti-infectives (From admission, onward)    Start     Dose/Rate Route Frequency Ordered Stop   12/21/22 0000  doxycycline (VIBRA-TABS) 100 MG tablet        100 mg Oral 2 times daily 12/21/22 0947 12/28/22 2359   12/21/22 0000  cefadroxil (DURICEF) 500 MG capsule        1,000 mg Oral 2 times daily 12/21/22 0947 12/28/22 2359   12/20/22 1430   vancomycin (VANCOREADY) IVPB 1500 mg/300 mL        1,500 mg 150 mL/hr over 120 Minutes Intravenous Every 48 hours 12/18/22 1421     12/19/22 0200  ceFEPIme (MAXIPIME) 2 g in sodium chloride 0.9 % 100 mL IVPB        2 g 200 mL/hr over 30 Minutes Intravenous Every 12 hours 12/18/22 1421     12/18/22 1300  ceFEPIme (MAXIPIME) 2 g in sodium chloride 0.9 % 100 mL IVPB        2 g 200 mL/hr over 30 Minutes Intravenous  Once 12/18/22 1255 12/18/22 1358   12/18/22 1300  vancomycin (VANCOREADY) IVPB 1750 mg/350 mL        1,750 mg 175 mL/hr over 120 Minutes Intravenous  Once 12/18/22 1256 12/18/22 1630        MEDICATIONS: Scheduled Meds:  sodium chloride   Intravenous Once   amLODipine  7.5 mg Oral Daily   aspirin  81 mg Oral Daily   atorvastatin  40 mg Oral Daily   benazepril  20 mg Oral Daily   buPROPion  150 mg Oral Daily   donepezil  10 mg Oral QHS   enoxaparin (LOVENOX) injection  40 mg Subcutaneous Q24H   gabapentin  400 mg Oral BID   And   gabapentin  800 mg Oral QHS   insulin aspart  0-9 Units Subcutaneous TID WC   insulin glargine-yfgn  12 Units Subcutaneous QHS   pantoprazole  40 mg Oral Daily   sertraline  100 mg Oral Daily   torsemide  40 mg Oral Daily   traZODone  25 mg Oral QHS   Continuous Infusions:  ceFEPime (MAXIPIME) IV 2 g (12/21/22 0213)   vancomycin 1,500 mg (12/20/22 1518)   PRN Meds:.acetaminophen, melatonin   I have personally reviewed following labs and imaging studies  LABORATORY DATA: CBC: Recent Labs  Lab 12/18/22 1244 12/19/22 0259 12/19/22 1727 12/20/22 0421 12/20/22 2159  WBC 8.9 8.4 9.0 8.7  --   NEUTROABS 6.3  --  5.6  --   --   HGB 8.2* 6.9* 7.7* 7.7* 9.1*  HCT 28.4* 23.9* 25.4* 25.4* 30.5*  MCV 66.2* 66.0* 66.8* 66.5*  --   PLT 348 335 344 334  --      Basic Metabolic Panel: Recent Labs  Lab 12/18/22 1244 12/19/22 0259 12/20/22 0421 12/21/22 0652  NA 139 142 141 141  K 2.3* 3.0* 3.1* 3.8  CL 89* 91* 93* 93*  CO2 35*  34* 41* 38*  GLUCOSE 207* 213* 125* 81  BUN 25* 26* 21 19  CREATININE 1.53* 1.68* 1.34* 1.26*  CALCIUM 8.8* 8.4* 8.6* 8.7*  MG  --   --  2.1  --   PHOS  --  3.1  --   --      GFR: Estimated  Creatinine Clearance: 48 mL/min (A) (by C-G formula based on SCr of 1.26 mg/dL (H)).  Liver Function Tests: Recent Labs  Lab 12/18/22 1244 12/19/22 0259  AST 45*  --   ALT 51*  --   ALKPHOS 87  --   BILITOT 0.7  --   PROT 7.3  --   ALBUMIN 3.0* 2.4*    No results for input(s): "LIPASE", "AMYLASE" in the last 168 hours. No results for input(s): "AMMONIA" in the last 168 hours.  Coagulation Profile: No results for input(s): "INR", "PROTIME" in the last 168 hours.  Cardiac Enzymes: No results for input(s): "CKTOTAL", "CKMB", "CKMBINDEX", "TROPONINI" in the last 168 hours.  BNP (last 3 results) No results for input(s): "PROBNP" in the last 8760 hours.  Lipid Profile: No results for input(s): "CHOL", "HDL", "LDLCALC", "TRIG", "CHOLHDL", "LDLDIRECT" in the last 72 hours.  Thyroid Function Tests: No results for input(s): "TSH", "T4TOTAL", "FREET4", "T3FREE", "THYROIDAB" in the last 72 hours.  Anemia Panel: No results for input(s): "VITAMINB12", "FOLATE", "FERRITIN", "TIBC", "IRON", "RETICCTPCT" in the last 72 hours.  Urine analysis:    Component Value Date/Time   COLORURINE YELLOW 05/27/2020 1947   APPEARANCEUR Clear 04/02/2022 1454   LABSPEC 1.010 05/27/2020 1947   PHURINE 5.0 05/27/2020 1947   GLUCOSEU Negative 04/02/2022 1454   GLUCOSEU 500 (A) 08/12/2017 1132   HGBUR SMALL (A) 05/27/2020 1947   BILIRUBINUR Negative 04/02/2022 1454   KETONESUR negative 05/18/2021 [place] 05/27/2020 1947   PROTEINUR Negative 04/02/2022 1454   PROTEINUR NEGATIVE 05/27/2020 1947   UROBILINOGEN 0.2 05/18/2021 1645   UROBILINOGEN 0.2 08/12/2017 1132   NITRITE Negative 04/02/2022 1454   NITRITE NEGATIVE 05/27/2020 1947   LEUKOCYTESUR Negative 04/02/2022 1454   LEUKOCYTESUR  LARGE (A) 05/27/2020 1947    Sepsis Labs: Lactic Acid, Venous    Component Value Date/Time   LATICACIDVEN 0.90 08/19/2017 1731    MICROBIOLOGY: Recent Results (from the past 240 hour(s))  WOUND CULTURE     Status: None (Preliminary result)   Collection Time: 12/17/22  4:16 PM   Specimen: Wound  Result Value Ref Range Status   MICRO NUMBER: [id number]  Preliminary   SPECIMEN QUALITY: Adequate  Preliminary   SOURCE: RIGHT HALLUX  Preliminary   STATUS: PRELIMINARY  Preliminary   GRAM STAIN:   Preliminary    Few White blood cells seen Rare epithelial cells Many Gram positive cocci in pairs Many Gram negative bacilli   RESULT: Culture in progress  Preliminary  Aerobic/Anaerobic Culture w Gram Stain (surgical/deep wound)     Status: None (Preliminary result)   Collection Time: 12/20/22  5:00 PM   Specimen: PATH Soft tissue  Result Value Ref Range Status   Specimen Description WOUND RIGHT TOE  Final   Special Requests NONE  Final   Gram Stain NO WBC SEEN NO ORGANISMS SEEN   Final   Culture   Final    NO GROWTH < 12 HOURS Performed at [facility], [address], [place], [place] [address]    Report Status PENDING  Incomplete    RADIOLOGY STUDIES/RESULTS: DG Foot Complete Right  Result Date: 12/20/2022 CLINICAL DATA:  Osteomyelitis of the right foot. Postoperative exam status post amputation. EXAM: RIGHT FOOT COMPLETE - 3+ VIEW COMPARISON:  Right foot radiograph 12/18/2022 FINDINGS: Interval amputation of the great toe at the level of the IP joint. No subcutaneous emphysema. Diffuse osteopenia. Degenerative changes throughout the midfoot. Small posterior and plantar calcaneal enthesophytes. Vascular calcifications seen  in the distal right lower leg and throughout the right foot. IMPRESSION: Interval amputation of the great toe at the level of the IP joint. Remainder of exam is similar to prior studies. Electronically Signed   By: Ileana Roup M.D.   On: 12/20/2022 21:04      LOS: 3 days   Oren Binet, MD  [facility]    To contact the attending provider between 7A-7P or the covering provider during after hours 7P-7A, please log into the web site [email or web address] and access using universal [place] password for that web site. If you do not have the password, please call the hospital operator.  12/21/2022, 11:16 AM

## 2022-12-21 NOTE — Evaluation (Signed)
Occupational Therapy Evaluation Patient Details Name: Laura Atkins MRN: [id number] DOB: [birth year] Today's Date: 12/21/2022   History of Present Illness Patient is a 82 y.o.  female with history of 8 TN, HLD, DM-2, anxiety/depression-who presented with worsening ulcer in the right great toe-upon further evaluation-she was found to have acute osteomyelitis. Pt now s/p R great toe amputation   Clinical Impression   Pt presents with decline in function and safety with ADLs and ADL mobility with impaired strength, balance endurance and cognition (pt with hx of dementia and behaviors per her daughter). PTA pt lived at home with husband and daughter. Pt's daughter is he primary caregiver and reports that pt and pt's husband also has dementia and that pt has hx of aggressive and "difficult" behaviors. Pt's daughter also reports they have a ramped entrance that at baseline pt is able to feed, groom and bathe herself with set up and min step by steps cues, requires max - total with LB dressing and toileting, pt used RW for mobility at home and required min A for sit - stand and min A to navigate with RW to walk to bathroom. Pt''s daughter works during the day and another family member does assist. Pt currently requires mod A to sit EOB, min guard A with UB ADLs, max - total A with LB ADLs, total A with toileting and was unable to stand from EOB to RW with 3 attempts. Pt required redirection multiple times, was argumentative with her daughter with some confusion during functional tasks with safety and sequencing. Pt would benefit from acute OT services to address impairments to maximize level of function and safety. Pt's daughter would like for pt to d/c to a SNF for short term rehab following acute care stay     Recommendations for follow up therapy are one component of a multi-disciplinary discharge planning process, led by the attending physician.  Recommendations may be updated based on patient status,  additional functional criteria and insurance authorization.   Assistance Recommended at Discharge Frequent or constant Supervision/Assistance  Patient can return home with the following A lot of help with bathing/dressing/bathroom;Two people to help with walking and/or transfers;Assistance with feeding;Help with stairs or ramp for entrance    Functional Status Assessment  Patient has had a recent decline in their functional status and demonstrates the ability to make significant improvements in function in a reasonable and predictable amount of time.  Equipment Recommendations  Other (comment) (TBD at next venue of care)    Recommendations for Other Services PT consult     Precautions / Restrictions Precautions Precautions: Fall Required Braces or Orthoses: Other Brace Other Brace: R post op shoe Restrictions Weight Bearing Restrictions: Yes RLE Weight Bearing: Partial weight bearing Other Position/Activity Restrictions: heel weight bearing      Mobility Bed Mobility Overal bed mobility: Needs Assistance Bed Mobility: Supine to Sit, Sit to Supine     Supine to sit: Mod assist, HOB elevated Sit to supine: Max assist   General bed mobility comments: mod A to elevate trunk, max A with LEs back onto bed. pt able to scoot hips forward to EOB, unable to lateral scoot toward rail. pt required max A for LEs back onto bed    Transfers Overall transfer level: Needs assistance Equipment used: Rolling walker (2 wheels) Transfers: Sit to/from Stand Sit to Stand: Total assist           General transfer comment: attempted x 3 fit - stand from EOB with RW,  pt unable to power up or clear buttocks from bed, will require +2 assist      Balance Overall balance assessment: Needs assistance Sitting-balance support: No upper extremity supported, Feet supported Sitting balance-Leahy Scale: Fair       Standing balance-Leahy Scale: Zero Standing balance comment: unable to stand                            ADL either performed or assessed with clinical judgement   ADL Overall ADL's : Needs assistance/impaired Eating/Feeding: Set up;Supervision/ safety;Sitting;Bed level   Grooming: Wash/dry hands;Wash/dry face;Min guard;Sitting   Upper Body Bathing: Min guard;Sitting   Lower Body Bathing: Maximal assistance   Upper Body Dressing : Min guard;Sitting   Lower Body Dressing: Total assistance     Toilet Transfer Details (indicate cue type and reason): unable Toileting- Clothing Manipulation and Hygiene: Total assistance;Bed level         General ADL Comments: pt requires assist at baseline with LB dressing, toileting and navigating with RW; verbal cues for self feeding     Vision Baseline Vision/History: 1 Wears glasses Patient Visual Report: No change from baseline       Perception     Praxis      Pertinent Vitals/Pain Pain Assessment Pain Assessment: 0-10 Pain Score: 5  Pain Location: R foot Pain Descriptors / Indicators: Sore, Grimacing, Guarding Pain Intervention(s): Monitored during session, Limited activity within patient's tolerance, Repositioned     Hand Dominance Right   Extremity/Trunk Assessment Upper Extremity Assessment Upper Extremity Assessment: Generalized weakness   Lower Extremity Assessment Lower Extremity Assessment: Defer to PT evaluation       Communication Communication Communication: No difficulties   Cognition Arousal/Alertness: Awake/alert Behavior During Therapy: Flat affect Overall Cognitive Status: History of cognitive impairments - at baseline Area of Impairment: Memory, Following commands, Problem solving, Awareness, Attention, Orientation                 Orientation Level: Disoriented to, Time, Situation Current Attention Level: Selective, Alternating   Following Commands: Follows one step commands inconsistently, Follows one step commands with increased time     Problem Solving: Slow  processing, Decreased initiation, Requires verbal cues, Requires tactile cues General Comments: pt required frequent redirection. Pt's daughter states that cognition has declined over past few weeks and that pt ha shx of aggessive and "difficult" behaviors     General Comments       Exercises     Shoulder Instructions      Home Living Family/patient expects to be discharged to:: Private residence Living Arrangements: Children;Spouse/significant other Available Help at Discharge: Family;Available 24 hours/day Type of Home: House Home Access: Ramped entrance     Home Layout: One level     Bathroom Shower/Tub: Walk-in shower;Tub/shower unit   Bathroom Toilet: Handicapped height     Home Equipment: Conservation officer, nature (2 wheels);Grab bars - toilet;Electric scooter;BSC/3in1   Additional Comments: POLF and cognition info provided by pt's daughter      Prior Functioning/Environment Prior Level of Function : Needs assist  Cognitive Assist : Mobility (cognitive);ADLs (cognitive) Mobility (Cognitive): Step by step cues ADLs (Cognitive): Step by step cues       Mobility Comments: assist with sit - stand and navigating RW ADLs Comments: requires cues for set up when eating and for bathing and UB dressing, max A with LB dressing        OT Problem List: Decreased strength;Decreased activity tolerance;Decreased cognition;Decreased  knowledge of use of DME or AE;Pain;Obesity;Decreased safety awareness;Decreased coordination;Impaired balance (sitting and/or standing);Decreased knowledge of precautions      OT Treatment/Interventions: Self-care/ADL training;Balance training;Therapeutic exercise;DME and/or AE instruction;Therapeutic activities;Patient/family education    OT Goals(Current goals can be found in the care plan section) Acute Rehab OT Goals Patient Stated Goal: per daughter: for pt to go to rehab and have more assistance OT Goal Formulation: With patient/family Time For Goal  Achievement: 01/04/23 Potential to Achieve Goals: Good ADL Goals Pt Will Perform Grooming: with supervision;with set-up;sitting Pt Will Perform Upper Body Bathing: with min guard assist;with supervision;sitting Pt Will Perform Lower Body Bathing: with mod assist;sitting/lateral leans Pt Will Perform Upper Body Dressing: with min guard assist;with supervision;sitting Pt Will Transfer to Toilet: with max assist;with mod assist;with +2 assist;stand pivot transfer;bedside commode Additional ADL Goal #1: Pt will intiate self feeding with 2-3 verbal cues and for completion/swallowing  OT Frequency: Min 2X/week    Co-evaluation              AM-PAC OT "6 Clicks" Daily Activity     Outcome Measure Help from another person eating meals?: A Little Help from another person taking care of personal grooming?: A Little Help from another person toileting, which includes using toliet, bedpan, or urinal?: Total Help from another person bathing (including washing, rinsing, drying)?: A Lot Help from another person to put on and taking off regular upper body clothing?: A Little Help from another person to put on and taking off regular lower body clothing?: Total 6 Click Score: 13   End of Session Equipment Utilized During Treatment: Gait belt;Rolling walker (2 wheels)  Activity Tolerance: Patient limited by fatigue;No increased pain Patient left: with call bell/phone within reach;with bed alarm set;with family/visitor present  OT Visit Diagnosis: Unsteadiness on feet (R26.81);Other abnormalities of gait and mobility (R26.89);Muscle weakness (generalized) (M62.81);Other symptoms and signs involving cognitive function;Pain Pain - Right/Left: Right Pain - part of body: Ankle and joints of foot                Time: 1007-1051 OT Time Calculation (min): 44 min Charges:  OT Evaluation $OT Eval Moderate Complexity: 1 Mod OT Treatments $Self Care/Home Management : 8-22 mins $Therapeutic Activity: 8-22  mins    Britt Bottom 12/21/2022, 1:00 PM

## 2022-12-21 NOTE — TOC Initial Note (Signed)
Transition of Care [facility]) - Initial/Assessment Note    Patient Details  Name: Laura Atkins MRN: [id number] Date of Birth: Sep 24, 1941  Transition of Care [facility]) CM/SW Contact:    Benard Halsted, LCSW Phone Number: 12/21/2022, 3:27 PM  Clinical Narrative:                 CSW received consult for possible SNF placement at time of discharge. CSW spoke with patient, spouse, and daughter [name] at bedside. [name] reported that patient's spouse is currently unable to care for patient at their home given patient's current physical needs and fall risk. Patient expressed understanding of PT recommendation and is agreeable to SNF placement at time of discharge. [name] reports preference for [facility]. [facility] before. CSW discussed insurance authorization process and will provide Medicare SNF ratings list. CSW will send out referrals for review and provide bed offers as available.   Skilled Nursing Rehab Facilities-   [email or web address]   Ratings out of 5 stars (5 the highest)   Name Address  Phone # [place] Inspection Overall  [facility] [address] [place] 4 5 2 3   [facility]  [address] [place], [place] [phone] 4 2 5 5   [facility] [place], [place] 1 3 1 1   [place], [place] 2 2 4 4   [facility] [address] [place] 2 1 2 1   [place]. [address] [place] [phone] 3 3 4 4   [facility] [address] [place] 4 1 3 2   [facility] [address] [place] 4 1 3 2   [address] ([place]) [place], [place] [phone] 3 1 2 1   [facility] [address], [place] [phone] 3 1 1 1   [facility] [address], [facility] [phone] 3 2 2 2   [facility]  ([place]) [place]. [place], [place] [phone] 3 1 1 1   [facility] Flock [address] [id number] 4 2 4 4           [place], [place] 4 1 3 2   [facility] [place] [address] [place] 3 1 5 4   [facility], [place], [address] [phone] 1 1 2 1   [facility] [address] [phone] 2 2 4 4           [address] (no [facility]) [address], [place] [phone] 5 5 5 5   [facility] (No Humana) [phone] [address], [place] 4 1 4 3   [place] (No UHC) [place], [place] 5 5 5 5   [facility] [address] [phone] 2 3 5 5   [place] [address], [place] 1 1 2 1   [facility] [address], [address] [place] 3 1 1 1   [id number] [place], [place] 5 2 5 5   [facility]  [address], [place] 2 2 1 1   [facility] [address] [place] 3 2 1 1   [facility] [place], [place] 2 2 2 2           [facility] [address], [place] 1 1 1  [address], [place]  [phone] 2 4 3 3   [facility] [place] [address], [place] [phone] 3 2 3 3   [place] [address] [place] 2 1 1 1   [place] (No Humana) [address] [place] [phone] 2 2 3 3   [facility]) [address], [place] [phone] 2 1 1 1           [facility] [place], [place] 5 4 5 5   [facility])  [facility] [address], [place] 2 1 2 1   [facility]) [place] [address] [place] 3 1 4 3   [facility] [address], [place] 3 3 4 4   [address] [place], [place] 2 3 1 1   [place] [facility]) [address] [place] [phone] 2 1 4 3      Expected Discharge Plan: [place] Barriers to Discharge: Insurance Authorization, SNF Pending bed offer   Patient Goals and CMS Choice Patient states their goals for this hospitalization and ongoing recovery are:: Rehab CMS Medicare.gov Compare Post Acute Care list provided to:: Patient Represenative (must comment) Choice offered to / list presented to : [place] / [place] ownership interest in [facility].provided to:: [facility] POA / Guardian    Expected Discharge Plan and Services In-house Referral: Clinical Social Work   Post Acute Care Choice: [place] Living arrangements for the past 2 months: [place]                                      Prior Living Arrangements/Services Living arrangements for the past 2 months: Single Family Home Lives with:: Spouse Patient language and need for interpreter reviewed:: Yes Do you feel safe going back to the place where you live?: Yes      Need for Family Participation in Patient Care: Yes (Comment) Care giver support system in place?: Yes (comment) Current home services: DME Criminal Activity/Legal Involvement Pertinent to Current Situation/Hospitalization: No - Comment as needed  Activities of Daily Living Home Assistive Devices/Equipment: [email or web address] (specify type) ADL Screening (condition at time of admission) Patient's cognitive ability adequate to safely complete daily activities?: No Is the patient deaf or have difficulty hearing?: No Does the patient have difficulty seeing, even when wearing glasses/contacts?: No Does the patient have difficulty concentrating, remembering, or making decisions?: Yes Patient able to express need for assistance with ADLs?: Yes Does the patient have difficulty  dressing or bathing?: Yes Independently performs ADLs?: No Communication: Independent Dressing (OT): Needs assistance Is this a change from baseline?: Pre-admission baseline Grooming: Needs assistance Is this a change from baseline?: Pre-admission baseline Feeding: Independent Bathing: Needs assistance Is this a change from baseline?: Pre-admission baseline Toileting: Dependent Is this a change from baseline?: Pre-admission baseline In/Out Bed: Needs assistance Is this a change from baseline?: Pre-admission baseline Walks in Home: Needs assistance Is this a change from baseline?: Pre-admission baseline Does the patient have difficulty walking or climbing stairs?: Yes Weakness of Legs: Both Weakness of Arms/Hands: None  Permission Sought/Granted Permission sought to share information with : Facility [place] Rile, Family Supports Permission granted to share information with : Yes, Verbal Permission Granted  Share Information with NAME: Sport and exercise psychologist  Permission granted to share info w AGENCY: SNFs  Permission granted to share info w Relationship: HCPOA  Permission granted to share info w Contact Information: [phone]  Emotional Assessment Appearance:: Appears stated age Attitude/Demeanor/Rapport: Engaged Affect (typically observed): Accepting, Appropriate, Pleasant Orientation: : Oriented to Self, Oriented to Place, Oriented to  Time, Oriented to Situation Alcohol / Substance Use: Not Applicable Psych Involvement: No (comment)  Admission diagnosis:  Osteomyelitis [M86.9] Osteomyelitis of right foot, unspecified type [M86.9] Patient Active Problem List   Diagnosis Date Noted   Osteomyelitis ([place]) 12/18/2022   Hypokalemia 12/18/2022   Heart failure with preserved ejection fraction ([place]) 12/18/2022   Chronic kidney disease, stage III (moderate) (HCC) 12/18/2022   Obesity (BMI 30-39.9) 12/18/2022   Anemia 10/29/2022   Anemia due to chronic kidney disease 08/23/2022    Insomnia 08/14/2022   Bradycardia 08/14/2022   Uncontrolled type 2 diabetes mellitus with hypoglycemia, with long-term current use of insulin ([place]) 06/12/2022   Abnormal liver CT 03/22/2022   Melena 01/23/2022   Change in stool caliber 01/23/2022   Lower abdominal pain 12/01/2021   Rectal pain 10/24/2021   Urge incontinence 10/10/2021   Polyneuropathy associated with underlying disease ([place]) 01/06/2021   Chronic kidney disease, stage 4 (severe) ([place]) 01/06/2021   Dementia ([place]) 07/13/2020   Cognitive deficits    Carotid bruit 05/26/2020   Hyperlipidemia associated with type 2 diabetes mellitus ([place]) 12/08/2019   Left knee pain 02/11/2017   Vitamin D deficiency 10/15/2016   Urinary incontinence 10/15/2016   Limited mobility 05/27/2016   Type 2 diabetes mellitus with hyperglycemia ([place]) 02/04/2016   Sleep terror disorder 12/14/2015   Fecal incontinence 12/13/2015   Back pain of lumbar region with sciatica 04/15/2015   Spondylosis, cervical, with myelopathy 04/14/2015   Bilateral leg edema 12/13/2014   PAD (peripheral artery disease) ([place]) 01/28/2014   OSA (obstructive sleep apnea) 04/10/2013   Peripheral neuropathy 03/12/2013   Hearing loss 11/06/2012   Seizure-like activity ([place]) 01/25/2012   UNSTEADY GAIT 11/07/2010   Alpha thalassemia ([place]) 01/26/2010   Morbid obesity ([place]) 04/21/2008   Anxiety and depression 04/21/2008   Essential hypertension 04/21/2008   GERD 04/21/2008   Osteoarthritis of left knee 04/21/2008   PCP:  Fayrene Helper, MD Pharmacy:   [place], [place] [id number] [place] [id number] Phone: [phone] Fax: [phone]  [facility] [place], [place] [address] Phone: [phone] Fax: [phone]  [facility] [address] [place] [address] Phone: [phone] Fax: [phone]     Social  Determinants of Health (SDOH) Social History: SDOH Screenings   Food Insecurity: No Food Insecurity (12/18/2022)  Housing: Low Risk  (12/18/2022)  Transportation Needs: No Transportation Needs (12/18/2022)  Utilities: Not At Risk (12/18/2022)  Alcohol Screen: Low Risk  (04/18/2022)  Depression (PHQ2-9): Low Risk  (08/14/2022)  Financial Resource Strain: Low Risk  (04/18/2022)  Physical Activity: Inactive (04/18/2022)  Social Connections: Moderately Integrated (04/18/2022)  Stress: No Stress Concern Present (04/18/2022)  Tobacco Use: Low Risk  (12/21/2022)   SDOH Interventions:     Readmission Risk Interventions     No data to display

## 2022-12-21 NOTE — Progress Notes (Addendum)
Pharmacy Antibiotic Note  Laura Atkins is a 82 y.o. female admitted on 12/18/2022 with  osteomyelitis .  Pharmacy has been consulted for vancomycin and cefepime dosing. We started her on Cefepime 2g q12h and Vancomycin 1500 every 48 hours.  Initally predicted AUC430. Using Vd: 0.5, Scr: 1.53.  S/p right toe amputation 3/28.  Scr is trending down.  Today Scr is 1.26 Recalculated  AUC based on Scr 1.26:  eAUC : 378, but she received Vancomycin 1500 mg on 3/28 at 1518, thus does not need a vanc dose dose today. We will follow up Scr on 3/30.  If Scr remains ~1.26 would change dose to 1000 mg IV every 24 hours for a predicted AUC 504, using Scr 1.26, Vd 0.5 (BMI 38).   Current Cefepime dose 2g q12h if appropriate for CrCl 48 ml/min.    Vanc 3/26>> Cefepime 3/26>>  3/28  tissue /wound R toe:  ngtd <12 hours 3/25 wound cx: right hallux: group F Streptococcus  Plan:  Continue Cefepime 2g every 12h and Vancomycin 1500 every 48 hours for now.   Follow up renal function tomorrow AM and adjustment antibiotics doses as indicated, if IV antibiotics continued. Follow culture data for de-escalation.>>   Discharge orders are in from MD for Cefadroxil and doxycycline   Weight: 114.2 kg (251 lb 12.3 oz)  Temp (24hrs), Avg:98.7 F (37.1 C), Min:98.1 F (36.7 C), Max:99.3 F (37.4 C)  Recent Labs  Lab 12/18/22 1244 12/19/22 0259 12/19/22 1727 12/20/22 0421 12/21/22 0652  WBC 8.9 8.4 9.0 8.7  --   CREATININE 1.53* 1.68*  --  1.34* 1.26*     Estimated Creatinine Clearance: 48 mL/min (A) (by C-G formula based on SCr of 1.26 mg/dL (H)).    Allergies  Allergen Reactions   Penicillins Shortness Of Breath, Itching and Rash   Prednisone Shortness Of Breath, Itching and Rash   Propoxyphene N-Acetaminophen Itching and Nausea And Vomiting    Darvocet    Gentamicin Rash    Cream   Sulfa Antibiotics Itching and Nausea And Vomiting   Thank you for allowing pharmacy to be a part of this patient's  care.  Nicole Cella, RPh Clinical Pharmacist  12/21/2022 4:23 PM

## 2022-12-21 NOTE — Plan of Care (Signed)

## 2022-12-22 DIAGNOSIS — M869 Osteomyelitis, unspecified: Secondary | ICD-10-CM | POA: Diagnosis not present

## 2022-12-22 DIAGNOSIS — E11649 Type 2 diabetes mellitus with hypoglycemia without coma: Secondary | ICD-10-CM | POA: Diagnosis not present

## 2022-12-22 DIAGNOSIS — Z794 Long term (current) use of insulin: Secondary | ICD-10-CM | POA: Diagnosis not present

## 2022-12-22 LAB — GLUCOSE, CAPILLARY: Glucose-Capillary: 159 mg/dL — ABNORMAL HIGH (ref 70–99)

## 2022-12-22 LAB — CREATININE, SERUM
Creatinine, Ser: 1.28 mg/dL — ABNORMAL HIGH (ref 0.44–1.00)
GFR, Estimated: 43 mL/min — ABNORMAL LOW (ref >60)

## 2022-12-22 MED ORDER — DOXYCYCLINE HYCLATE 100 MG PO TABS
100.0000 mg | ORAL_TABLET | Freq: Two times a day (BID) | ORAL | Status: DC
  Administered 2022-12-22 – 2022-12-24 (×5): 100 mg via ORAL
  Filled 2022-12-22 (×4): qty 1

## 2022-12-22 MED ORDER — CEFADROXIL 500 MG PO CAPS
500.0000 mg | ORAL_CAPSULE | Freq: Two times a day (BID) | ORAL | Status: DC
  Filled 2022-12-22: qty 1

## 2022-12-22 MED ORDER — CEFADROXIL 500 MG PO CAPS
1000.0000 mg | ORAL_CAPSULE | Freq: Two times a day (BID) | ORAL | Status: DC
  Administered 2022-12-22 – 2022-12-24 (×5): 1000 mg via ORAL
  Filled 2022-12-22 (×6): qty 2

## 2022-12-22 NOTE — Plan of Care (Signed)

## 2022-12-22 NOTE — Progress Notes (Signed)
PROGRESS NOTE        PATIENT DETAILS Name: Laura Atkins Age: 82 y.o. Sex: female Date of Birth: 1941/01/11 Admit Date: 12/18/2022 Admitting Physician Norval Morton, MD [id number], Norwood Levo, MD  Brief Summary: Patient is a 82 y.o.  female with history of 8 TN, HLD, DM-2, anxiety/depression-who presented with worsening ulcer in the right great toe-upon further evaluation-she was found to have acute osteomyelitis and subsequently admitted to [facility] service.  Significant events: 3/26>> admit to [facility]  Significant studies: 3/26>> MRI right foot: Osteomyelitis-distal phalanx great toe. 3/27>> echo: EF [id number], grade 2 diastolic dysfunction.  Significant microbiology data: 3/28>> right great toe culture: Negative so far.  Procedures: 3/28>> right great toe amputation by podiatry.  Consults: Podiatry  Subjective: No major issues overnight-daughter at bedside.  Some intermittent confusion but easily reoriented.  Objective: Vitals: Blood pressure (!) 141/54, pulse 76, temperature 97.8 F (36.6 C), temperature source Oral, resp. rate 19, weight 114.3 kg, SpO2 99 %.   Exam: Gen Exam:Alert awake-not in any distress HEENT:atraumatic, normocephalic Chest: B/L clear to auscultation anteriorly CVS:S1S2 regular Abdomen:soft non tender, non distended Extremities:no edema Neurology: Non focal Skin: no rash  Pertinent Labs/Radiology:    Latest Ref Rng & Units 12/20/2022    9:59 PM 12/20/2022    4:21 AM 12/19/2022    5:27 PM  CBC  WBC 4.0 - 10.5 K/uL  8.7  9.0   Hemoglobin 12.0 - 15.0 g/dL 9.1  7.7  7.7   Hematocrit 36.0 - 46.0 % 30.5  25.4  25.4   Platelets 150 - 400 K/uL  334  344     Lab Results  Component Value Date   NA 141 12/21/2022   K 3.8 12/21/2022   CL 93 (L) 12/21/2022   CO2 38 (H) 12/21/2022      Assessment/Plan: Acute osteomyelitis right great toe No major issues overnight S/p right toe amputation on 3/28 Has been transitioned  from IV antibiotics to oral Doxy/cefadroxil  Evaluated by PT-recommendations are for SNF  Acute on chronic HFpEF Significant improvement in volume status Given IV Lasix initially-but has been transitioned to oral Demadex  Hypokalemia Repleted.  DM-2 with uncontrolled hyperglycemia CBG stable Continue Semglee 12 units and SSI Follow/assess  Recent Labs    12/21/22 1533 12/21/22 1753 12/21/22 2040  GLUCAP 143* 159* 165*     CKD stage IIIb Close to baseline  Normocytic anemia due to CKD Alpha thalassemia trait Transfused 1 unit of PRBC on admission-last hemoglobin on 3/28 stable at 9.1. Follow CBC periodically  HTN BP stable Continue amlodipine/benazepril-follow  Depression/anxiety Stable Zoloft/Wellbutrin  Dementia Mildly confused this morning but otherwise stable Continue Aricept Delirium precautions.  Morbid Obesity: Estimated body mass index is 38.31 kg/m as calculated from the following:   Height as of 10/29/22: 5\' 8"  (1.727 m).   Weight as of this encounter: 114.3 kg.   Code status:   Code Status: Full Code   DVT Prophylaxis: enoxaparin (LOVENOX) injection 40 mg Start: 12/19/22 1000   Family Communication: Daughter at bedside on 3/30   Disposition Plan: Status is: Inpatient Remains inpatient appropriate because: Severity of illness   Planned Discharge Destination:SNF   Diet: Diet Order             Diet - low sodium heart healthy           Diet  Carb Modified           Diet Carb Modified Fluid consistency: Thin; Room service appropriate? Yes  Diet effective now                     Antimicrobial agents: Anti-infectives (From admission, onward)    Start     Dose/Rate Route Frequency Ordered Stop   12/22/22 1000  doxycycline (VIBRA-TABS) tablet 100 mg        100 mg Oral Every 12 hours 12/22/22 0654     12/22/22 1000  cefadroxil (DURICEF) capsule 500 mg  Status:  Discontinued        500 mg Oral 2 times daily 12/22/22 0654 12/22/22  0657   12/22/22 1000  cefadroxil (DURICEF) capsule 1,000 mg        1,000 mg Oral 2 times daily 12/22/22 0657     12/21/22 0000  doxycycline (VIBRA-TABS) 100 MG tablet        100 mg Oral 2 times daily 12/21/22 0947 12/28/22 2359   12/21/22 0000  cefadroxil (DURICEF) 500 MG capsule        1,000 mg Oral 2 times daily 12/21/22 0947 12/28/22 2359   12/20/22 1430  vancomycin (VANCOREADY) IVPB 1500 mg/300 mL  Status:  Discontinued        1,500 mg 150 mL/hr over 120 Minutes Intravenous Every 48 hours 12/18/22 1421 12/22/22 0654   12/19/22 0200  ceFEPIme (MAXIPIME) 2 g in sodium chloride 0.9 % 100 mL IVPB  Status:  Discontinued        2 g 200 mL/hr over 30 Minutes Intravenous Every 12 hours 12/18/22 1421 12/22/22 0654   12/18/22 1300  ceFEPIme (MAXIPIME) 2 g in sodium chloride 0.9 % 100 mL IVPB        2 g 200 mL/hr over 30 Minutes Intravenous  Once 12/18/22 1255 12/18/22 1358   12/18/22 1300  vancomycin (VANCOREADY) IVPB 1750 mg/350 mL        1,750 mg 175 mL/hr over 120 Minutes Intravenous  Once 12/18/22 1256 12/18/22 1630        MEDICATIONS: Scheduled Meds:  sodium chloride   Intravenous Once   amLODipine  7.5 mg Oral Daily   aspirin  81 mg Oral Daily   atorvastatin  40 mg Oral Daily   benazepril  20 mg Oral Daily   buPROPion  150 mg Oral Daily   cefadroxil  1,000 mg Oral BID   donepezil  10 mg Oral QHS   doxycycline  100 mg Oral Q12H   enoxaparin (LOVENOX) injection  40 mg Subcutaneous Q24H   gabapentin  400 mg Oral BID   And   gabapentin  800 mg Oral QHS   insulin aspart  0-9 Units Subcutaneous TID WC   insulin glargine-yfgn  12 Units Subcutaneous QHS   pantoprazole  40 mg Oral Daily   sertraline  100 mg Oral Daily   torsemide  40 mg Oral Daily   traZODone  25 mg Oral QHS   Continuous Infusions:   PRN Meds:.acetaminophen, melatonin   I have personally reviewed following labs and imaging studies  LABORATORY DATA: CBC: Recent Labs  Lab 12/18/22 1244 12/19/22 0259  12/19/22 1727 12/20/22 0421 12/20/22 2159  WBC 8.9 8.4 9.0 8.7  --   NEUTROABS 6.3  --  5.6  --   --   HGB 8.2* 6.9* 7.7* 7.7* 9.1*  HCT 28.4* 23.9* 25.4* 25.4* 30.5*  MCV 66.2* 66.0* 66.8* 66.5*  --  PLT 348 335 344 334  --      Basic Metabolic Panel: Recent Labs  Lab 12/18/22 1244 12/19/22 0259 12/20/22 0421 12/21/22 0652  NA 139 142 141 141  K 2.3* 3.0* 3.1* 3.8  CL 89* 91* 93* 93*  CO2 35* 34* 41* 38*  GLUCOSE 207* 213* 125* 81  BUN 25* 26* 21 19  CREATININE 1.53* 1.68* 1.34* 1.26*  CALCIUM 8.8* 8.4* 8.6* 8.7*  MG  --   --  2.1  --   PHOS  --  3.1  --   --      GFR: Estimated Creatinine Clearance: 48.1 mL/min (A) (by C-G formula based on SCr of 1.26 mg/dL (H)).  Liver Function Tests: Recent Labs  Lab 12/18/22 1244 12/19/22 0259  AST 45*  --   ALT 51*  --   ALKPHOS 87  --   BILITOT 0.7  --   PROT 7.3  --   ALBUMIN 3.0* 2.4*    No results for input(s): "LIPASE", "AMYLASE" in the last 168 hours. No results for input(s): "AMMONIA" in the last 168 hours.  Coagulation Profile: No results for input(s): "INR", "PROTIME" in the last 168 hours.  Cardiac Enzymes: No results for input(s): "CKTOTAL", "CKMB", "CKMBINDEX", "TROPONINI" in the last 168 hours.  BNP (last 3 results) No results for input(s): "PROBNP" in the last 8760 hours.  Lipid Profile: No results for input(s): "CHOL", "HDL", "LDLCALC", "TRIG", "CHOLHDL", "LDLDIRECT" in the last 72 hours.  Thyroid Function Tests: No results for input(s): "TSH", "T4TOTAL", "FREET4", "T3FREE", "THYROIDAB" in the last 72 hours.  Anemia Panel: No results for input(s): "VITAMINB12", "FOLATE", "FERRITIN", "TIBC", "IRON", "RETICCTPCT" in the last 72 hours.  Urine analysis:    Component Value Date/Time   COLORURINE YELLOW 05/27/2020 1947   APPEARANCEUR Clear 04/02/2022 1454   LABSPEC 1.010 05/27/2020 1947   PHURINE 5.0 05/27/2020 1947   GLUCOSEU Negative 04/02/2022 1454   GLUCOSEU 500 (A) 08/12/2017 1132    HGBUR SMALL (A) 05/27/2020 1947   BILIRUBINUR Negative 04/02/2022 1454   KETONESUR negative 05/18/2021 [place] 05/27/2020 1947   PROTEINUR Negative 04/02/2022 1454   PROTEINUR NEGATIVE 05/27/2020 1947   UROBILINOGEN 0.2 05/18/2021 1645   UROBILINOGEN 0.2 08/12/2017 1132   NITRITE Negative 04/02/2022 1454   NITRITE NEGATIVE 05/27/2020 1947   LEUKOCYTESUR Negative 04/02/2022 1454   LEUKOCYTESUR LARGE (A) 05/27/2020 1947    Sepsis Labs: Lactic Acid, Venous    Component Value Date/Time   LATICACIDVEN 0.90 08/19/2017 1731    MICROBIOLOGY: Recent Results (from the past 240 hour(s))  WOUND CULTURE     Status: Abnormal   Collection Time: 12/17/22  4:16 PM   Specimen: Wound  Result Value Ref Range Status   MICRO NUMBER: [id number]  Final   SPECIMEN QUALITY: Adequate  Final   SOURCE: RIGHT HALLUX  Final   STATUS: FINAL  Final   GRAM STAIN:   Final    Few White blood cells seen Rare epithelial cells Many Gram positive cocci in pairs Many Gram negative bacilli   ISOLATE 1: Group F Streptococcus (A)  Final    Comment: Moderate growth of Group F Streptococcus Beta-hemolytic streptococci are predictably susceptible to Penicillin and other beta-lactams. Susceptibility testing not routinely performed. Please contact the laboratory within 3 days if susceptibility testing  is desired.   Aerobic/Anaerobic Culture w Gram Stain (surgical/deep wound)     Status: None (Preliminary result)   Collection Time: 12/20/22  5:00 PM   Specimen: PATH  Soft tissue  Result Value Ref Range Status   Specimen Description WOUND RIGHT TOE  Final   Special Requests NONE  Final   Gram Stain NO WBC SEEN NO ORGANISMS SEEN   Final   Culture   Final    NO GROWTH < 12 HOURS Performed at [facility], [place] [address], [place], [place] [address]    Report Status PENDING  Incomplete    RADIOLOGY STUDIES/RESULTS: DG Foot Complete Right  Result Date: 12/20/2022 CLINICAL DATA:  Osteomyelitis  of the right foot. Postoperative exam status post amputation. EXAM: RIGHT FOOT COMPLETE - 3+ VIEW COMPARISON:  Right foot radiograph 12/18/2022 FINDINGS: Interval amputation of the great toe at the level of the IP joint. No subcutaneous emphysema. Diffuse osteopenia. Degenerative changes throughout the midfoot. Small posterior and plantar calcaneal enthesophytes. Vascular calcifications seen in the distal right lower leg and throughout the right foot. IMPRESSION: Interval amputation of the great toe at the level of the IP joint. Remainder of exam is similar to prior studies. Electronically Signed   By: Ileana Roup M.D.   On: 12/20/2022 21:04     LOS: 4 days   Oren Binet, MD  [facility]    To contact the attending provider between 7A-7P or the covering provider during after hours 7P-7A, please log into the web site [email or web address] and access using universal [place] password for that web site. If you do not have the password, please call the hospital operator.  12/22/2022, 10:25 AM

## 2022-12-23 ENCOUNTER — Other Ambulatory Visit: Payer: Self-pay | Admitting: Family Medicine

## 2022-12-23 DIAGNOSIS — E11649 Type 2 diabetes mellitus with hypoglycemia without coma: Secondary | ICD-10-CM | POA: Diagnosis not present

## 2022-12-23 DIAGNOSIS — Z794 Long term (current) use of insulin: Secondary | ICD-10-CM | POA: Diagnosis not present

## 2022-12-23 DIAGNOSIS — M869 Osteomyelitis, unspecified: Secondary | ICD-10-CM | POA: Diagnosis not present

## 2022-12-23 LAB — GLUCOSE, CAPILLARY
Glucose-Capillary: 200 mg/dL — ABNORMAL HIGH (ref 70–99)
Glucose-Capillary: 139 mg/dL — ABNORMAL HIGH (ref 70–99)
Glucose-Capillary: 139 mg/dL — ABNORMAL HIGH (ref 70–99)

## 2022-12-23 NOTE — Plan of Care (Signed)

## 2022-12-23 NOTE — Progress Notes (Signed)
PROGRESS NOTE        PATIENT DETAILS Name: Laura Atkins Age: 82 y.o. Sex: female Date of Birth: 03-04-1941 Admit Date: 12/18/2022 Admitting Physician Norval Morton, MD [id number], Norwood Levo, MD  Brief Summary: Patient is a 82 y.o.  female with history of 8 TN, HLD, DM-2, anxiety/depression-who presented with worsening ulcer in the right great toe-upon further evaluation-she was found to have acute osteomyelitis and subsequently admitted to [facility] service.  Significant events: 3/26>> admit to [facility]  Significant studies: 3/26>> MRI right foot: Osteomyelitis-distal phalanx great toe. 3/27>> echo: EF [id number], grade 2 diastolic dysfunction.  Significant microbiology data: 3/28>> right great toe culture: Negative so far.  Procedures: 3/28>> right great toe amputation by podiatry.  Consults: Podiatry  Subjective: Lying comfortably in bed-no major issues overnight.  Daughter at bedside.  Awaiting SNF at this point.  Objective: Vitals: Blood pressure (!) 150/49, pulse 72, temperature 98.6 F (37 C), temperature source Oral, resp. rate 18, weight 114.8 kg, SpO2 96 %.   Exam: Gen Exam:Alert awake-not in any distress HEENT:atraumatic, normocephalic Chest: B/L clear to auscultation anteriorly CVS:S1S2 regular Abdomen:soft non tender, non distended Extremities:no edema Neurology: Non focal Skin: no rash  Pertinent Labs/Radiology:    Latest Ref Rng & Units 12/20/2022    9:59 PM 12/20/2022    4:21 AM 12/19/2022    5:27 PM  CBC  WBC 4.0 - 10.5 K/uL  8.7  9.0   Hemoglobin 12.0 - 15.0 g/dL 9.1  7.7  7.7   Hematocrit 36.0 - 46.0 % 30.5  25.4  25.4   Platelets 150 - 400 K/uL  334  344     Lab Results  Component Value Date   NA 141 12/21/2022   K 3.8 12/21/2022   CL 93 (L) 12/21/2022   CO2 38 (H) 12/21/2022      Assessment/Plan: Acute osteomyelitis right great toe No major issues overnight S/p right toe amputation on 3/28 Has been transitioned  from IV antibiotics to oral Doxy/cefadroxil  Evaluated by PT-recommendations are for SNF-awaiting bed availability/insurance authorization.  Remains medically stable.  Acute on chronic HFpEF Significant improvement in volume status Given IV Lasix initially-but has been transitioned to oral Demadex  Hypokalemia Repleted.  DM-2 with uncontrolled hyperglycemia CBG stable Continue Semglee 12 units and SSI Follow/assess  Recent Labs    12/21/22 1753 12/21/22 2040 12/22/22 1228  GLUCAP 159* 165* 159*     CKD stage IIIb Close to baseline  Normocytic anemia due to CKD Alpha thalassemia trait Transfused 1 unit of PRBC on admission-last hemoglobin on 3/28 stable at 9.1. Follow CBC periodically  HTN BP stable Continue amlodipine/benazepril-follow  Depression/anxiety Stable Zoloft/Wellbutrin  Dementia Mildly confused this morning but otherwise stable Continue Aricept Delirium precautions.  Morbid Obesity: Estimated body mass index is 38.48 kg/m as calculated from the following:   Height as of 10/29/22: 5\' 8"  (1.727 m).   Weight as of this encounter: 114.8 kg.   Code status:   Code Status: Full Code   DVT Prophylaxis: enoxaparin (LOVENOX) injection 40 mg Start: 12/19/22 1000   Family Communication: Daughter at bedside on 3/30   Disposition Plan: Status is: Inpatient Remains inpatient appropriate because: Severity of illness   Planned Discharge Destination:SNF when bed available.   Diet: Diet Order             Diet - low  sodium heart healthy           Diet Carb Modified           Diet Carb Modified Fluid consistency: Thin; Room service appropriate? Yes  Diet effective now                     Antimicrobial agents: Anti-infectives (From admission, onward)    Start     Dose/Rate Route Frequency Ordered Stop   12/22/22 1000  doxycycline (VIBRA-TABS) tablet 100 mg        100 mg Oral Every 12 hours 12/22/22 0654     12/22/22 1000  cefadroxil  (DURICEF) capsule 500 mg  Status:  Discontinued        500 mg Oral 2 times daily 12/22/22 0654 12/22/22 0657   12/22/22 1000  cefadroxil (DURICEF) capsule 1,000 mg        1,000 mg Oral 2 times daily 12/22/22 0657     12/21/22 0000  doxycycline (VIBRA-TABS) 100 MG tablet        100 mg Oral 2 times daily 12/21/22 0947 12/28/22 2359   12/21/22 0000  cefadroxil (DURICEF) 500 MG capsule        1,000 mg Oral 2 times daily 12/21/22 0947 12/28/22 2359   12/20/22 1430  vancomycin (VANCOREADY) IVPB 1500 mg/300 mL  Status:  Discontinued        1,500 mg 150 mL/hr over 120 Minutes Intravenous Every 48 hours 12/18/22 1421 12/22/22 0654   12/19/22 0200  ceFEPIme (MAXIPIME) 2 g in sodium chloride 0.9 % 100 mL IVPB  Status:  Discontinued        2 g 200 mL/hr over 30 Minutes Intravenous Every 12 hours 12/18/22 1421 12/22/22 0654   12/18/22 1300  ceFEPIme (MAXIPIME) 2 g in sodium chloride 0.9 % 100 mL IVPB        2 g 200 mL/hr over 30 Minutes Intravenous  Once 12/18/22 1255 12/18/22 1358   12/18/22 1300  vancomycin (VANCOREADY) IVPB 1750 mg/350 mL        1,750 mg 175 mL/hr over 120 Minutes Intravenous  Once 12/18/22 1256 12/18/22 1630        MEDICATIONS: Scheduled Meds:  sodium chloride   Intravenous Once   amLODipine  7.5 mg Oral Daily   aspirin  81 mg Oral Daily   atorvastatin  40 mg Oral Daily   benazepril  20 mg Oral Daily   buPROPion  150 mg Oral Daily   cefadroxil  1,000 mg Oral BID   donepezil  10 mg Oral QHS   doxycycline  100 mg Oral Q12H   enoxaparin (LOVENOX) injection  40 mg Subcutaneous Q24H   gabapentin  400 mg Oral BID   And   gabapentin  800 mg Oral QHS   insulin aspart  0-9 Units Subcutaneous TID WC   insulin glargine-yfgn  12 Units Subcutaneous QHS   pantoprazole  40 mg Oral Daily   sertraline  100 mg Oral Daily   torsemide  40 mg Oral Daily   traZODone  25 mg Oral QHS   Continuous Infusions:   PRN Meds:.acetaminophen, melatonin   I have personally reviewed  following labs and imaging studies  LABORATORY DATA: CBC: Recent Labs  Lab 12/18/22 1244 12/19/22 0259 12/19/22 1727 12/20/22 0421 12/20/22 2159  WBC 8.9 8.4 9.0 8.7  --   NEUTROABS 6.3  --  5.6  --   --   HGB 8.2* 6.9* 7.7* 7.7* 9.1*  HCT  28.4* 23.9* 25.4* 25.4* 30.5*  MCV 66.2* 66.0* 66.8* 66.5*  --   PLT 348 335 344 334  --      Basic Metabolic Panel: Recent Labs  Lab 12/18/22 1244 12/19/22 0259 12/20/22 0421 12/21/22 0652 12/22/22 1144  NA 139 142 141 141  --   K 2.3* 3.0* 3.1* 3.8  --   CL 89* 91* 93* 93*  --   CO2 35* 34* 41* 38*  --   GLUCOSE 207* 213* 125* 81  --   BUN 25* 26* 21 19  --   CREATININE 1.53* 1.68* 1.34* 1.26* 1.28*  CALCIUM 8.8* 8.4* 8.6* 8.7*  --   MG  --   --  2.1  --   --   PHOS  --  3.1  --   --   --      GFR: Estimated Creatinine Clearance: 47.4 mL/min (A) (by C-G formula based on SCr of 1.28 mg/dL (H)).  Liver Function Tests: Recent Labs  Lab 12/18/22 1244 12/19/22 0259  AST 45*  --   ALT 51*  --   ALKPHOS 87  --   BILITOT 0.7  --   PROT 7.3  --   ALBUMIN 3.0* 2.4*    No results for input(s): "LIPASE", "AMYLASE" in the last 168 hours. No results for input(s): "AMMONIA" in the last 168 hours.  Coagulation Profile: No results for input(s): "INR", "PROTIME" in the last 168 hours.  Cardiac Enzymes: No results for input(s): "CKTOTAL", "CKMB", "CKMBINDEX", "TROPONINI" in the last 168 hours.  BNP (last 3 results) No results for input(s): "PROBNP" in the last 8760 hours.  Lipid Profile: No results for input(s): "CHOL", "HDL", "LDLCALC", "TRIG", "CHOLHDL", "LDLDIRECT" in the last 72 hours.  Thyroid Function Tests: No results for input(s): "TSH", "T4TOTAL", "FREET4", "T3FREE", "THYROIDAB" in the last 72 hours.  Anemia Panel: No results for input(s): "VITAMINB12", "FOLATE", "FERRITIN", "TIBC", "IRON", "RETICCTPCT" in the last 72 hours.  Urine analysis:    Component Value Date/Time   COLORURINE YELLOW 05/27/2020 1947    APPEARANCEUR Clear 04/02/2022 1454   LABSPEC 1.010 05/27/2020 1947   PHURINE 5.0 05/27/2020 1947   GLUCOSEU Negative 04/02/2022 1454   GLUCOSEU 500 (A) 08/12/2017 1132   HGBUR SMALL (A) 05/27/2020 1947   BILIRUBINUR Negative 04/02/2022 1454   KETONESUR negative 05/18/2021 [place] 05/27/2020 1947   PROTEINUR Negative 04/02/2022 1454   PROTEINUR NEGATIVE 05/27/2020 1947   UROBILINOGEN 0.2 05/18/2021 1645   UROBILINOGEN 0.2 08/12/2017 1132   NITRITE Negative 04/02/2022 1454   NITRITE NEGATIVE 05/27/2020 1947   LEUKOCYTESUR Negative 04/02/2022 1454   LEUKOCYTESUR LARGE (A) 05/27/2020 1947    Sepsis Labs: Lactic Acid, Venous    Component Value Date/Time   LATICACIDVEN 0.90 08/19/2017 1731    MICROBIOLOGY: Recent Results (from the past 240 hour(s))  WOUND CULTURE     Status: Abnormal   Collection Time: 12/17/22  4:16 PM   Specimen: Wound  Result Value Ref Range Status   MICRO NUMBER: [id number]  Final   SPECIMEN QUALITY: Adequate  Final   SOURCE: RIGHT HALLUX  Final   STATUS: FINAL  Final   GRAM STAIN:   Final    Few White blood cells seen Rare epithelial cells Many Gram positive cocci in pairs Many Gram negative bacilli   ISOLATE 1: Group F Streptococcus (A)  Final    Comment: Moderate growth of Group F Streptococcus Beta-hemolytic streptococci are predictably susceptible to Penicillin and other beta-lactams. Susceptibility testing  not routinely performed. Please contact the laboratory within 3 days if susceptibility testing  is desired.   Aerobic/Anaerobic Culture w Gram Stain (surgical/deep wound)     Status: None (Preliminary result)   Collection Time: 12/20/22  5:00 PM   Specimen: PATH Soft tissue  Result Value Ref Range Status   Specimen Description WOUND RIGHT TOE  Final   Special Requests NONE  Final   Gram Stain NO WBC SEEN NO ORGANISMS SEEN   Final   Culture   Final    NO GROWTH 2 DAYS NO ANAEROBES ISOLATED; CULTURE IN PROGRESS FOR 5  DAYS Performed at [facility], [address], [place], [place] [address]    Report Status PENDING  Incomplete    RADIOLOGY STUDIES/RESULTS: No results found.   LOS: 5 days   Oren Binet, MD  [facility]    To contact the attending provider between 7A-7P or the covering provider during after hours 7P-7A, please log into the web site [email or web address] and access using universal [place] password for that web site. If you do not have the password, please call the hospital operator.  12/23/2022, 10:44 AM

## 2022-12-24 ENCOUNTER — Other Ambulatory Visit (HOSPITAL_COMMUNITY): Payer: Self-pay

## 2022-12-24 DIAGNOSIS — M869 Osteomyelitis, unspecified: Secondary | ICD-10-CM | POA: Diagnosis not present

## 2022-12-24 DIAGNOSIS — N1832 Chronic kidney disease, stage 3b: Secondary | ICD-10-CM | POA: Diagnosis not present

## 2022-12-24 DIAGNOSIS — E11649 Type 2 diabetes mellitus with hypoglycemia without coma: Secondary | ICD-10-CM | POA: Diagnosis not present

## 2022-12-24 DIAGNOSIS — G4733 Obstructive sleep apnea (adult) (pediatric): Secondary | ICD-10-CM | POA: Diagnosis not present

## 2022-12-24 LAB — GLUCOSE, CAPILLARY
Glucose-Capillary: 126 mg/dL — ABNORMAL HIGH (ref 70–99)
Glucose-Capillary: 151 mg/dL — ABNORMAL HIGH (ref 70–99)

## 2022-12-24 LAB — SURGICAL PATHOLOGY

## 2022-12-24 MED ORDER — CEFADROXIL 500 MG PO CAPS
1000.0000 mg | ORAL_CAPSULE | Freq: Two times a day (BID) | ORAL | 0 refills | Status: AC
  Filled 2022-12-24: qty 16, 4d supply, fill #0

## 2022-12-24 MED ORDER — DOXYCYCLINE HYCLATE 100 MG PO TABS
100.0000 mg | ORAL_TABLET | Freq: Two times a day (BID) | ORAL | 0 refills | Status: AC
  Filled 2022-12-24: qty 8, 4d supply, fill #0

## 2022-12-24 NOTE — TOC Progression Note (Signed)
Transition of Care [facility]) - Progression Note    Patient Details  Name: NAISHA GATRELL MRN: [id number] Date of Birth: 05-07-1941  Transition of Care [facility]) CM/SW [place], LCSW Phone Number: 12/24/2022, 8:40 AM  Clinical Narrative:    CSW requested [facility], [employer], and [employer] review referral.    Expected Discharge Plan: [place] Barriers to Discharge: Ship broker, SNF Pending bed offer  Expected Discharge Plan and Services In-house Referral: Clinical Social Work   Post Acute Care Choice: [place] Living arrangements for the past 2 months: Single Family Home                                       Social Determinants of Health (SDOH) Interventions SDOH Screenings   Food Insecurity: No Food Insecurity (12/18/2022)  Housing: Low Risk  (12/18/2022)  Transportation Needs: No Transportation Needs (12/18/2022)  Utilities: Not At Risk (12/18/2022)  Alcohol Screen: Low Risk  (04/18/2022)  Depression (PHQ2-9): Low Risk  (08/14/2022)  Financial Resource Strain: Low Risk  (04/18/2022)  Physical Activity: Inactive (04/18/2022)  Social Connections: Moderately Integrated (04/18/2022)  Stress: No Stress Concern Present (04/18/2022)  Tobacco Use: Low Risk  (12/21/2022)    Readmission Risk Interventions     No data to display

## 2022-12-24 NOTE — Progress Notes (Signed)
Occupational Therapy Treatment Patient Details Name: Laura Atkins MRN: [id number] DOB: 1941/01/22 Today's Date: 12/24/2022   History of present illness Patient is a 82 y.o. female admitted to [facility] on 3/26 for worsening ulcer on R great toe. Further evaluation revealed osteomyelitis, no s/p R great toe amputation on 3/28. PMHx: HTN, HLD, DM-2, anxiety/depression, dementia   OT comments  Pt making progress with functional goals. Pt seated in arm chair upon arrival with 2 daughters present. Family reports that pt will now d/c home and that they are working to get additional support of [place]. Pt mod A for standing and BSC transfers with DME. Updating d/c rec to home with [facility] OT. OT will continue to follow acutely to maximize level of function and safety   Recommendations for follow up therapy are one component of a multi-disciplinary discharge planning process, led by the attending physician.  Recommendations may be updated based on patient status, additional functional criteria and insurance authorization.    Assistance Recommended at Discharge Frequent or constant Supervision/Assistance  Patient can return home with the following  A lot of help with bathing/dressing/bathroom;Two people to help with walking and/or transfers;Assistance with feeding;Help with stairs or ramp for entrance   Equipment Recommendations  None recommended by OT    Recommendations for Other Services      Precautions / Restrictions Precautions Precautions: Fall Required Braces or Orthoses: Other Brace Other Brace: R post op shoe Restrictions Weight Bearing Restrictions: No RLE Weight Bearing: Partial weight bearing RLE Partial Weight Bearing Percentage or Pounds: heel weight bearing Other Position/Activity Restrictions: heel weight bearing with post op shoe       Mobility Bed Mobility               General bed mobility comments: pt seated in chair upon arrival    Transfers Overall transfer level:  Needs assistance Equipment used: Rolling walker (2 wheels) Transfers: Sit to/from Stand Sit to Stand: Mod assist           General transfer comment: mod A for power up from elevated bed and BSC     Balance Overall balance assessment: Needs assistance Sitting-balance support: Bilateral upper extremity supported, Feet supported, No upper extremity supported Sitting balance-Leahy Scale: Fair     Standing balance support: Bilateral upper extremity supported, During functional activity Standing balance-Leahy Scale: Poor                             ADL either performed or assessed with clinical judgement   ADL Overall ADL's : Needs assistance/impaired     Grooming: Wash/dry face;Wash/dry hands;Supervision/safety;Set up;Sitting                   Toilet Transfer: Moderate assistance;Rolling walker (2 wheels);Stand-pivot;BSC/3in1;Cueing for safety   Toileting- Clothing Manipulation and Hygiene: Maximal assistance;Sit to/from stand       Functional mobility during ADLs: Moderate assistance      Extremity/Trunk Assessment Upper Extremity Assessment Upper Extremity Assessment: Generalized weakness   Lower Extremity Assessment Lower Extremity Assessment: Defer to PT evaluation        Vision Baseline Vision/History: 1 Wears glasses Patient Visual Report: No change from baseline     Perception     Praxis      Cognition Arousal/Alertness: Awake/alert Behavior During Therapy: WFL for tasks assessed/performed   Area of Impairment: Memory, Following commands, Problem solving, Awareness, Attention  Following Commands: Follows one step commands inconsistently, Follows one step commands with increased time     Problem Solving: Slow processing, Decreased initiation, Requires verbal cues, Requires tactile cues General Comments: cues needed to remain focused on task. Pt very fearful of falling        Exercises       Shoulder Instructions       General Comments VSS on RA. Family present and feels that they can handle pt's current limitations at home with HHPT and aide. Educated family on transfer discs that can be purchased to ease transfers while pt having difficulty stepping feet    Pertinent Vitals/ Pain       Pain Assessment Pain Assessment: No/denies pain Pain Score: 0-No pain Pain Intervention(s): Monitored during session, Repositioned  Home Living                                          Prior Functioning/Environment              Frequency  Min 2X/week        Progress Toward Goals  OT Goals(current goals can now be found in the care plan section)  Progress towards OT goals: Progressing toward goals     Plan Discharge plan needs to be updated;Frequency remains appropriate    Co-evaluation                 AM-PAC OT "6 Clicks" Daily Activity     Outcome Measure   Help from another person eating meals?: None Help from another person taking care of personal grooming?: A Little Help from another person toileting, which includes using toliet, bedpan, or urinal?: A Lot Help from another person bathing (including washing, rinsing, drying)?: A Lot Help from another person to put on and taking off regular upper body clothing?: A Little Help from another person to put on and taking off regular lower body clothing?: Total 6 Click Score: 15    End of Session Equipment Utilized During Treatment: Gait belt;Rolling walker (2 wheels);Other (comment) (BSC)  OT Visit Diagnosis: Unsteadiness on feet (R26.81);Other abnormalities of gait and mobility (R26.89);Muscle weakness (generalized) (M62.81);Other symptoms and signs involving cognitive function;Pain Pain - Right/Left: Right   Activity Tolerance Patient limited by fatigue;No increased pain   Patient Left with call bell/phone within reach;with family/visitor present;in bed;in chair   Nurse Communication           Time: [id number] OT Time Calculation (min): 17 min  Charges: OT General Charges $OT Visit: 1 Visit OT Treatments $Self Care/Home Management : 8-22 mins   Emmit Alexanders [facility] 12/24/2022, 2:08 PM
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
## 2022-12-24 NOTE — TOC Transition Note (Signed)
LEFT TOC DISCHARGE MEDS WITH AMINA @ NURSE DESK

## 2022-12-24 NOTE — Progress Notes (Addendum)
Patient seen and examined Discussed with daughters (1 at bedside-the other 1 over the phone)-they both feel that patient's mobility has somewhat improved-she is now easily able to move around in the bed-and able to pull herself up-and lean forward without any assistance.  Family thinks that she will likely be more comfortable in her familiar surroundings.  They are asking if we can discharge her home.  I have reached out to physical therapy to see if we can have her reevaluated this morning-to see if we can downgrade to home with home health services.  Family indicating that they can provide 24/7 care. Full note to follow-possible discharge later today if patient can go home with home health.  Otherwise will need SNF.

## 2022-12-24 NOTE — TOC Transition Note (Signed)
Discharge medications (2) have been retrieved from Main Pharmacy weekend lockup and are now being stored in the Transitions of Care (TOC) Pharmacy on the second floor until patient is ready for discharge.   

## 2022-12-24 NOTE — Discharge Summary (Signed)
PATIENT DETAILS Name: Laura Atkins Age: 82 y.o. Sex: female Date of Birth: 1940/11/20 MRN: [id number]. Admitting Physician: Norval Morton, MD [id number], Norwood Levo, MD  Admit Date: 12/18/2022 Discharge date: 12/24/2022  Recommendations for Outpatient Follow-up:  Follow up with PCP in 1-2 weeks Please obtain CMP/CBC in one week Please ensure follow up with podiatry.    Admitted From:  Home  Disposition: Home health   Discharge Condition: good  CODE STATUS:   Code Status: Full Code   Diet recommendation:  Diet Order             Diet - low sodium heart healthy           Diet Carb Modified           Diet Carb Modified Fluid consistency: Thin; Room service appropriate? Yes  Diet effective now                    Brief Summary: Patient is a 82 y.o.  female with history of 8 TN, HLD, DM-2, anxiety/depression-who presented with worsening ulcer in the right great toe-upon further evaluation-she was found to have acute osteomyelitis and subsequently admitted to [facility] service.   Significant events: 3/26>> admit to [facility]   Significant studies: 3/26>> MRI right foot: Osteomyelitis-distal phalanx great toe. 3/27>> echo: EF [id number], grade 2 diastolic dysfunction.   Significant microbiology data: 3/28>> right great toe culture: Negative so far.   Procedures: 3/28>> right great toe amputation by podiatry.   Consults: Podiatry  Brief Hospital Course: Acute osteomyelitis right great toe No major issues overnight S/p right toe amputation on 3/28 Has been transitioned from IV antibiotics to oral Doxy/cefadroxil  Evaluated by PT-initially recommendations were for SNF-but has improved over the weekend-family now wants to take patient home.  Reevaluated by PT today-recommendations have been now downgraded to home with home health.   Please ensure follow-up with podiatry as previously planned.   Acute on chronic HFpEF Significant improvement in volume status Given IV  Lasix initially-but has been transitioned to oral Demadex   Hypokalemia Repleted.   DM-2 with uncontrolled hyperglycemia CBG stable Continue Semglee 12 units and SSI Follow/assess  CKD stage IIIb Close to baseline   Normocytic anemia due to CKD Alpha thalassemia trait Transfused 1 unit of PRBC on admission-last hemoglobin on 3/28 stable at 9.1. Follow CBC periodically   HTN BP stable Continue amlodipine/benazepril-follow   Depression/anxiety Stable Zoloft/Wellbutrin   Dementia Mildly confused this morning but otherwise stable Continue Aricept Delirium precautions.   Morbid Obesity: Estimated body mass index is 38.48 kg/m as calculated from the following:   Height as of 10/29/22: 5\' 8"  (1.727 m).   Weight as of this encounter: 114.8 kg.    Discharge Diagnoses:  Principal Problem:   Osteomyelitis Active Problems:   Hypokalemia   Heart failure with preserved ejection fraction   Uncontrolled type 2 diabetes mellitus with hypoglycemia, with long-term current use of insulin   Essential hypertension   Anemia due to chronic kidney disease   Chronic kidney disease, stage III (moderate)   Dementia   Anxiety and depression   Obesity (BMI 30-39.9)   OSA (obstructive sleep apnea)   Discharge Instructions:  Activity:  As tolerated with Full fall precautions use walker/cane & assistance as needed   Discharge Instructions     Diet - low sodium heart healthy   Complete by: As directed    Diet Carb Modified   Complete by: As directed    Discharge  instructions   Complete by: As directed    Follow with Primary MD  Fayrene Helper, MD in 1-2 weeks  Follow-up with podiatry office in 1 week.  Heel weightbearing in surgical shoe.  Keep dressing clean dry and intact until follow-up with podiatry.  Please get a complete blood count and chemistry panel checked by your Primary MD at your next visit, and again as instructed by your Primary MD.  Get Medicines reviewed  and adjusted: Please take all your medications with you for your next visit with your Primary MD  Laboratory/radiological data: Please request your Primary MD to go over all hospital tests and procedure/radiological results at the follow up, please ask your Primary MD to get all Hospital records sent to his/her office.  In some cases, they will be blood work, cultures and biopsy results pending at the time of your discharge. Please request that your primary care M.D. follows up on these results.  Also Note the following: If you experience worsening of your admission symptoms, develop shortness of breath, life threatening emergency, suicidal or homicidal thoughts you must seek medical attention immediately by calling 911 or calling your MD immediately  if symptoms less severe.  You must read complete instructions/literature along with all the possible adverse reactions/side effects for all the Medicines you take and that have been prescribed to you. Take any new Medicines after you have completely understood and accpet all the possible adverse reactions/side effects.   Do not drive when taking Pain medications or sleeping medications (Benzodaizepines)  Do not take more than prescribed Pain, Sleep and Anxiety Medications. It is not advisable to combine anxiety,sleep and pain medications without talking with your primary care practitioner  Special Instructions: If you have smoked or chewed Tobacco  in the last 2 yrs please stop smoking, stop any regular Alcohol  and or any Recreational drug use.  Wear Seat belts while driving.  Please note: You were cared for by a hospitalist during your hospital stay. Once you are discharged, your primary care physician will handle any further medical issues. Please note that NO REFILLS for any discharge medications will be authorized once you are discharged, as it is imperative that you return to your primary care physician (or establish a relationship with a  primary care physician if you do not have one) for your post hospital discharge needs so that they can reassess your need for medications and monitor your lab values.   Discharge instructions   Complete by: As directed    Follow with Primary MD  Fayrene Helper, MD in 1-2 weeks  Please follow-up with podiatry as previously planned.    Please get a complete blood count and chemistry panel checked by your Primary MD at your next visit, and again as instructed by your Primary MD.  Get Medicines reviewed and adjusted: Please take all your medications with you for your next visit with your Primary MD  Laboratory/radiological data: Please request your Primary MD to go over all hospital tests and procedure/radiological results at the follow up, please ask your Primary MD to get all Hospital records sent to his/her office.  In some cases, they will be blood work, cultures and biopsy results pending at the time of your discharge. Please request that your primary care M.D. follows up on these results.  Also Note the following: If you experience worsening of your admission symptoms, develop shortness of breath, life threatening emergency, suicidal or homicidal thoughts you must seek medical attention immediately  by calling 911 or calling your MD immediately  if symptoms less severe.  You must read complete instructions/literature along with all the possible adverse reactions/side effects for all the Medicines you take and that have been prescribed to you. Take any new Medicines after you have completely understood and accpet all the possible adverse reactions/side effects.   Do not drive when taking Pain medications or sleeping medications (Benzodaizepines)  Do not take more than prescribed Pain, Sleep and Anxiety Medications. It is not advisable to combine anxiety,sleep and pain medications without talking with your primary care practitioner  Special Instructions: If you have smoked or chewed  Tobacco  in the last 2 yrs please stop smoking, stop any regular Alcohol  and or any Recreational drug use.  Wear Seat belts while driving.  Please note: You were cared for by a hospitalist during your hospital stay. Once you are discharged, your primary care physician will handle any further medical issues. Please note that NO REFILLS for any discharge medications will be authorized once you are discharged, as it is imperative that you return to your primary care physician (or establish a relationship with a primary care physician if you do not have one) for your post hospital discharge needs so that they can reassess your need for medications and monitor your lab values.   Increase activity slowly   Complete by: As directed    heel weight bearing in surgical shoe.   Increase activity slowly   Complete by: As directed    Heel weightbearing in surgical shoe until seen by podiatry.   Leave dressing on - Keep it clean, dry, and intact until clinic visit   Complete by: As directed    No wound care   Complete by: As directed    keep dressing clean dry and intact until follow-up with podiatry in about a week.      Allergies as of 12/24/2022       Reactions   Penicillins Shortness Of Breath, Itching, Rash   Prednisone Shortness Of Breath, Itching, Rash   Propoxyphene N-acetaminophen Itching, Nausea And Vomiting   Darvocet    Gentamicin Rash   Cream   Sulfa Antibiotics Itching, Nausea And Vomiting        Medication List     TAKE these medications    Accu-Chek FastClix Lancets Misc 1 each by Does not apply route 4 (four) times daily. Use to monitor glucose levels 4 times daily; E11.65   Accu-Chek Guide Me w/Device Kit 1 each by Does not apply route 4 (four) times daily. Use to monitor glucose levels 4 times daily; E11.65   amLODipine 5 MG tablet Commonly known as: NORVASC Take 1.5 tablets (7.5 mg total) by mouth daily.   aspirin 81 MG tablet Take 81 mg by mouth daily.    atorvastatin 40 MG tablet Commonly known as: LIPITOR TAKE 1 TABLET BY MOUTH EVERY DAY   B-D SINGLE USE SWABS REGULAR Pads 1 each by Does not apply route 4 (four) times daily. Use to prep site for glucose monitoring 4 times daily; E11.65   benazepril 20 MG tablet Commonly known as: LOTENSIN TAKE 1 TABLET BY MOUTH EVERY DAY   buPROPion 150 MG 24 hr tablet Commonly known as: WELLBUTRIN XL TAKE 1 TABLET BY MOUTH EVERY MORNING   cefadroxil 500 MG capsule Commonly known as: DURICEF Take 2 capsules (1,000 mg total) by mouth 2 (two) times daily for 7 days.   Cholecalciferol 125 MCG (5000 UT) Tabs Take 5,000  Units by mouth daily.   diphenhydrAMINE 25 MG tablet Commonly known as: BENADRYL Take 25 mg by mouth at bedtime.   donepezil 10 MG tablet Commonly known as: ARICEPT Take 1 tablet (10 mg total) by mouth at bedtime.   doxycycline 100 MG tablet Commonly known as: VIBRA-TABS Take 1 tablet (100 mg total) by mouth 2 (two) times daily for 7 days.   Easy Touch Pen Needles 31G X 5 MM Misc Generic drug: Insulin Pen Needle USE TO INJECT INSULIN ONCE DAILY   gabapentin 400 MG capsule Commonly known as: NEURONTIN TAKE ONE CAPSULE BY MOUTH EVERY MORNING and TAKE ONE CAPSULE AT NOON and TAKE TWO CAPSULES AT BEDTIME What changed: See the new instructions.   glucose blood test strip 1 each by Other route in the morning, at noon, in the evening, and at bedtime. Use as instructed qid. E11.65   Accu-Chek Guide test strip Generic drug: glucose blood USE TO CHECK BLOOD SUGAR FOUR TIMES DAILY   INSULIN SYRINGE 1CC/31GX5/16" 31G X 5/16" 1 ML Misc 1 each by Does not apply route daily. Use to inject insulin daily; E11.65   Magnesium 250 MG Tabs Take 500 mg by mouth daily.   multivitamin capsule Take 1 capsule by mouth daily.   NONFORMULARY OR COMPOUNDED ITEM Apothecary hemorrhoid cream. Apply rectally up to qid as directed.   omeprazole 20 MG capsule Commonly known as:  PRILOSEC TAKE ONE CAPSULE BY MOUTH EVERY DAY   sertraline 100 MG tablet Commonly known as: ZOLOFT TAKE 1 TABLET BY MOUTH DAILY   torsemide 20 MG tablet Commonly known as: DEMADEX TAKE 2 TABLETS BY MOUTH EVERY DAY and additionally TAKE 1 TABLET EVERY OTHER DAY What changed:  how much to take how to take this when to take this additional instructions   Tranquility Undergarments Misc 1 each by Does not apply route daily.   traZODone 50 MG tablet Commonly known as: DESYREL TAKE 1/2 TABLET BY MOUTH AT BEDTIME FOR SLEEP What changed:  how much to take how to take this when to take this additional instructions   Tresiba FlexTouch 200 UNIT/ML FlexTouch Pen Generic drug: insulin degludec Inject 12 Units into the skin at bedtime.   UNABLE TO FIND Walker x 1  DX unsteady gait, osteoarthritis of left knee   UNABLE TO FIND Elevated Toliet Seat x 1 DX: unsteady gait, back pain, osteoarthritis of left knee   UNABLE TO FIND Standing upright walker x 1  DX M54.40, M19.90   UNABLE TO FIND Incontinence pads and supplies   UNABLE TO FIND Rollator Walker   UNABLE TO FIND XL Tranquility Briefs for incontinence.   UNABLE TO FIND Premium overnight disposible absorbant underwear XL UPC- [id number]   Vitamin B-12 5000 MCG Tbdp Take 5,000 mcg by mouth daily.               Durable Medical Equipment  (From admission, onward)           Start     Ordered   12/20/22 1948  For home use only DME Other see comment  Once       Comments: Post-op shoe  Question:  Length of Need  Answer:  6 Months   12/20/22 1947   12/19/22 0817  For home use only DME Other see comment  Once       Comments: PRAFO boot or heel cushion boot to offload right heel.  Question:  Length of Need  Answer:  6 Months   12/19/22  0818              Discharge Care Instructions  (From admission, onward)           Start     Ordered   12/24/22 0000  Leave dressing on - Keep it clean,  dry, and intact until clinic visit        12/24/22 1145            Follow-up Information     Fayrene Helper, MD. Schedule an appointment as soon as possible for a visit in 1 week(s).   Specialty: Family Medicine Contact information: [address] [place] [address] [phone]         Lorenda Peck, DPM. Schedule an appointment as soon as possible for a visit in 1 week(s).   Specialty: Health visitor information: [address] [place] [address] [phone]                Allergies  Allergen Reactions   Penicillins Shortness Of Breath, Itching and Rash   Prednisone Shortness Of Breath, Itching and Rash   Propoxyphene N-Acetaminophen Itching and Nausea And Vomiting    Darvocet    Gentamicin Rash    Cream   Sulfa Antibiotics Itching and Nausea And Vomiting     Other Procedures/Studies: DG Foot Complete Right  Result Date: 12/20/2022 CLINICAL DATA:  Osteomyelitis of the right foot. Postoperative exam status post amputation. EXAM: RIGHT FOOT COMPLETE - 3+ VIEW COMPARISON:  Right foot radiograph 12/18/2022 FINDINGS: Interval amputation of the great toe at the level of the IP joint. No subcutaneous emphysema. Diffuse osteopenia. Degenerative changes throughout the midfoot. Small posterior and plantar calcaneal enthesophytes. Vascular calcifications seen in the distal right lower leg and throughout the right foot. IMPRESSION: Interval amputation of the great toe at the level of the IP joint. Remainder of exam is similar to prior studies. Electronically Signed   By: Ileana Roup M.D.   On: 12/20/2022 21:04   ECHOCARDIOGRAM COMPLETE  Result Date: 12/19/2022    ECHOCARDIOGRAM REPORT   Patient Name:   Laura Atkins Halder Date of Exam: 12/19/2022 Medical Rec #:  [id number]       Height:       68.0 in Accession #:    [id number]      Weight:       261.7 lb Date of Birth:  1941-09-06       BSA:          2.291 m Patient Age:    75  years        BP:           136/60 mmHg Patient Gender: F               HR:           74 bpm. Exam Location:  Inpatient Procedure: 2D Echo, Cardiac Doppler and Color Doppler Indications:    CHF-Acute Diastolic [id number]  History:        Patient has prior history of Echocardiogram examinations, most                 recent 05/28/2020. CHF, PAD, Arrythmias:Bradycardia; Risk                 Factors:Hypertension, Diabetes, Dyslipidemia and Sleep Apnea.                 CKD, stage 4.  Sonographer:    Ronny Flurry Referring Phys: [phone]  RONDELL A SMITH IMPRESSIONS  1. Left ventricular ejection fraction, by estimation, is 50 to 55%. The left ventricle has low normal function. The left ventricle has no regional wall motion abnormalities. Left ventricular diastolic parameters are consistent with Grade II diastolic dysfunction (pseudonormalization).  2. Right ventricular systolic function is normal. The right ventricular size is mildly enlarged.  3. Left atrial size was mildly dilated.  4. The mitral valve is normal in structure. Mild to moderate mitral valve regurgitation. No evidence of mitral stenosis.  5. Tricuspid valve regurgitation is mild to moderate.  6. The aortic valve is normal in structure. Aortic valve regurgitation is mild. No aortic stenosis is present.  7. The inferior vena cava is normal in size with greater than 50% respiratory variability, suggesting right atrial pressure of 3 mmHg. FINDINGS  Left Ventricle: Left ventricular ejection fraction, by estimation, is 50 to 55%. The left ventricle has low normal function. The left ventricle has no regional wall motion abnormalities. The left ventricular internal cavity size was normal in size. There is no left ventricular hypertrophy. Left ventricular diastolic parameters are consistent with Grade II diastolic dysfunction (pseudonormalization). Right Ventricle: The right ventricular size is mildly enlarged. No increase in right ventricular wall thickness. Right  ventricular systolic function is normal. Left Atrium: Left atrial size was mildly dilated. Right Atrium: Right atrial size was normal in size. Pericardium: There is no evidence of pericardial effusion. Presence of epicardial fat layer. Mitral Valve: The mitral valve is normal in structure. Mild to moderate mitral annular calcification. Mild to moderate mitral valve regurgitation. No evidence of mitral valve stenosis. Tricuspid Valve: The tricuspid valve is normal in structure. Tricuspid valve regurgitation is mild to moderate. No evidence of tricuspid stenosis. Aortic Valve: The aortic valve is normal in structure. Aortic valve regurgitation is mild. No aortic stenosis is present. Aortic valve mean gradient measures 7.0 mmHg. Aortic valve peak gradient measures 11.8 mmHg. Aortic valve area, by VTI measures 2.01  cm. Pulmonic Valve: The pulmonic valve was normal in structure. Pulmonic valve regurgitation is trivial. No evidence of pulmonic stenosis. Aorta: The aortic root is normal in size and structure. Venous: The inferior vena cava is normal in size with greater than 50% respiratory variability, suggesting right atrial pressure of 3 mmHg. IAS/Shunts: No atrial level shunt detected by color flow Doppler.  LEFT VENTRICLE PLAX 2D LVIDd:         5.60 cm   Diastology LVIDs:         4.10 cm   LV e' medial:    5.22 cm/s LV PW:         0.70 cm   LV E/e' medial:  30.1 LV IVS:        0.90 cm   LV e' lateral:   8.16 cm/s LVOT diam:     2.10 cm   LV E/e' lateral: 19.2 LV SV:         87 LV SV Index:   38 LVOT Area:     3.46 cm  RIGHT VENTRICLE             IVC RV S prime:     11.70 cm/s  IVC diam: 2.50 cm TAPSE (M-mode): 2.7 cm LEFT ATRIUM           Index        RIGHT ATRIUM           Index LA diam:      5.00 cm 2.18 cm/m   RA Area:  20.50 cm LA Vol (A2C): 72.9 ml 31.82 ml/m  RA Volume:   56.10 ml  24.49 ml/m LA Vol (A4C): 88.4 ml 38.58 ml/m  AORTIC VALVE AV Area (Vmax):    2.12 cm AV Area (Vmean):   2.11 cm AV Area  (VTI):     2.01 cm AV Vmax:           172.00 cm/s AV Vmean:          120.000 cm/s AV VTI:            0.432 m AV Peak Grad:      11.8 mmHg AV Mean Grad:      7.0 mmHg LVOT Vmax:         105.33 cm/s LVOT Vmean:        73.267 cm/s LVOT VTI:          0.251 m LVOT/AV VTI ratio: 0.58  AORTA Ao Root diam: 3.40 cm Ao Asc diam:  3.00 cm MITRAL VALVE                TRICUSPID VALVE MV Area (PHT): 3.85 cm     TR Peak grad:   45.4 mmHg MV Decel Time: 197 msec     TR Vmax:        337.00 cm/s MV E velocity: 157.00 cm/s MV A velocity: 110.00 cm/s  SHUNTS MV E/A ratio:  1.43         Systemic VTI:  0.25 m                             Systemic Diam: 2.10 cm Kardie Tobb DO Electronically signed by Berniece Salines DO Signature Date/Time: 12/19/2022/12:26:12 PM    Final    MR FOOT RIGHT WO CONTRAST  Result Date: 12/18/2022 CLINICAL DATA:  Open wound involving the great toe and heel. EXAM: MRI OF THE RIGHT FOREFOOT WITHOUT CONTRAST TECHNIQUE: Multiplanar, multisequence MR imaging of the right foot was performed. No intravenous contrast was administered. COMPARISON:  Radiographs, same date. FINDINGS: Diffuse abnormal T1 and T2 signal intensity in the distal phalanx of the great toe consistent with osteomyelitis. No findings suspicious for septic arthritis at the joint space. No MR findings suspicious for osteomyelitis involving the calcaneus. Midfoot degenerative changes but no findings for septic arthritis or osteomyelitis. Diffuse subcutaneous soft tissue swelling/edema suggesting cellulitis. There is also diffuse myofasciitis without findings suspicious for pyomyositis. IMPRESSION: 1. MR findings consistent with osteomyelitis involving the distal phalanx of the great toe. 2. No other definite sites of osteomyelitis. 3. Diffuse cellulitis and myofasciitis without findings for pyomyositis. Electronically Signed   By: Marijo Sanes M.D.   On: 12/18/2022 18:56   DG CHEST PORT 1 VIEW  Result Date: 12/18/2022 CLINICAL DATA:  Shortness of  breath EXAM: PORTABLE CHEST 1 VIEW COMPARISON:  Chest x-ray 05/27/2020 FINDINGS: The heart is mildly enlarged. The lungs are clear. There is no pleural effusion or pneumothorax. No acute fractures are seen. IMPRESSION: Mild cardiomegaly. No acute pulmonary process. Electronically Signed   By: Ronney Asters M.D.   On: 12/18/2022 16:28   DG Foot Complete Right  Result Date: 12/18/2022 CLINICAL DATA:  Osteomyelitis of the great toe. EXAM: RIGHT FOOT COMPLETE - 3 VIEW COMPARISON:  None Available. FINDINGS: Global severe osteopenia. Vascular calcifications. Diffuse soft tissue swelling. Well corticated plantar and Achilles calcaneal spurs. Mild degenerative changes of the midfoot. There is soft tissue swelling about the great toe with erosive  changes of the distal tuft of the distal phalanx of the great toe. This would be consistent with patient's history of osteomyelitis. IMPRESSION: Soft tissue swelling particularly of the great toe with bony erosive changes of the distal tuft of the distal phalanx of the great toe. This would be consistent with provided history of osteomyelitis. Global osteopenia with soft tissue swelling, vascular calcifications. Mild degenerative changes. Electronically Signed   By: Jill Side M.D.   On: 12/18/2022 13:18     TODAY-DAY OF DISCHARGE:  Subjective:   Nickolette Milnor today has no headache,no chest abdominal pain,no new weakness tingling or numbness, feels much better wants to go home today.   Objective:   Blood pressure (!) 144/68, pulse 72, temperature 98.4 F (36.9 C), temperature source Oral, resp. rate 18, weight 114.8 kg, SpO2 95 %.  Intake/Output Summary (Last 24 hours) at 12/24/2022 1146 Last data filed at 12/23/2022 1307 Gross per 24 hour  Intake 240 ml  Output --  Net 240 ml   Filed Weights   12/21/22 0500 12/22/22 0412 12/23/22 0500  Weight: 114.2 kg 114.3 kg 114.8 kg    Exam: Awake Alert, Oriented *3, No new F.N deficits, Normal  affect [place].AT,PERRAL Supple Neck,No JVD, No cervical lymphadenopathy appriciated.  Symmetrical Chest wall movement, Good air movement bilaterally, CTAB RRR,No Gallops,Rubs or new Murmurs, No Parasternal Heave +ve B.Sounds, Abd Soft, Non tender, No organomegaly appriciated, No rebound -guarding or rigidity. No Cyanosis, Clubbing or edema, No new Rash or bruise   PERTINENT RADIOLOGIC STUDIES: No results found.   PERTINENT LAB RESULTS: CBC: No results for input(s): "WBC", "HGB", "HCT", "PLT" in the last 72 hours. CMET CMP     Component Value Date/Time   NA 141 12/21/2022 0652   NA 146 (H) 10/24/2021 1218   K 3.8 12/21/2022 0652   CL 93 (L) 12/21/2022 0652   CO2 38 (H) 12/21/2022 0652   GLUCOSE 81 12/21/2022 0652   BUN 19 12/21/2022 0652   BUN 42 (H) 10/24/2021 1218   CREATININE 1.28 (H) 12/22/2022 1144   CREATININE 2.02 (H) 03/22/2022 1534   CALCIUM 8.7 (L) 12/21/2022 0652   PROT 7.3 12/18/2022 1244   PROT 7.0 10/24/2021 1218   ALBUMIN 2.4 (L) 12/19/2022 0259   ALBUMIN 4.3 10/24/2021 1218   AST 45 (H) 12/18/2022 1244   ALT 51 (H) 12/18/2022 1244   ALT 16 03/22/2022 1534   ALKPHOS 87 12/18/2022 1244   BILITOT 0.7 12/18/2022 1244   BILITOT 0.2 10/24/2021 1218   GFRNONAA 43 (L) 12/22/2022 1144   GFRNONAA 40 (L) 12/01/2019 1137   GFRAA 55 (L) 06/03/2020 0210   GFRAA 46 (L) 12/01/2019 1137    GFR Estimated Creatinine Clearance: 47.4 mL/min (A) (by C-G formula based on SCr of 1.28 mg/dL (H)). No results for input(s): "LIPASE", "AMYLASE" in the last 72 hours. No results for input(s): "CKTOTAL", "CKMB", "CKMBINDEX", "TROPONINI" in the last 72 hours. Invalid input(s): "POCBNP" No results for input(s): "DDIMER" in the last 72 hours. No results for input(s): "HGBA1C" in the last 72 hours. No results for input(s): "CHOL", "HDL", "LDLCALC", "TRIG", "CHOLHDL", "LDLDIRECT" in the last 72 hours. No results for input(s): "TSH", "T4TOTAL", "T3FREE", "THYROIDAB" in the last 72  hours.  Invalid input(s): "FREET3" No results for input(s): "VITAMINB12", "FOLATE", "FERRITIN", "TIBC", "IRON", "RETICCTPCT" in the last 72 hours. Coags: No results for input(s): "INR" in the last 72 hours.  Invalid input(s): "PT" Microbiology: Recent Results (from the past 240 hour(s))  WOUND CULTURE  Status: Abnormal   Collection Time: 12/17/22  4:16 PM   Specimen: Wound  Result Value Ref Range Status   MICRO NUMBER: [id number]  Final   SPECIMEN QUALITY: Adequate  Final   SOURCE: RIGHT HALLUX  Final   STATUS: FINAL  Final   GRAM STAIN:   Final    Few White blood cells seen Rare epithelial cells Many Gram positive cocci in pairs Many Gram negative bacilli   ISOLATE 1: Group F Streptococcus (A)  Final    Comment: Moderate growth of Group F Streptococcus Beta-hemolytic streptococci are predictably susceptible to Penicillin and other beta-lactams. Susceptibility testing not routinely performed. Please contact the laboratory within 3 days if susceptibility testing  is desired.   Aerobic/Anaerobic Culture w Gram Stain (surgical/deep wound)     Status: None (Preliminary result)   Collection Time: 12/20/22  5:00 PM   Specimen: PATH Soft tissue  Result Value Ref Range Status   Specimen Description WOUND RIGHT TOE  Final   Special Requests NONE  Final   Gram Stain NO WBC SEEN NO ORGANISMS SEEN   Final   Culture   Final    NO GROWTH 4 DAYS NO ANAEROBES ISOLATED; CULTURE IN PROGRESS FOR 5 DAYS Performed at [facility], [address], [place], [place] [address]    Report Status PENDING  Incomplete    FURTHER DISCHARGE INSTRUCTIONS:  Get Medicines reviewed and adjusted: Please take all your medications with you for your next visit with your Primary MD  Laboratory/radiological data: Please request your Primary MD to go over all hospital tests and procedure/radiological results at the follow up, please ask your Primary MD to get all Hospital records sent to his/her  office.  In some cases, they will be blood work, cultures and biopsy results pending at the time of your discharge. Please request that your primary care M.D. goes through all the records of your hospital data and follows up on these results.  Also Note the following: If you experience worsening of your admission symptoms, develop shortness of breath, life threatening emergency, suicidal or homicidal thoughts you must seek medical attention immediately by calling 911 or calling your MD immediately  if symptoms less severe.  You must read complete instructions/literature along with all the possible adverse reactions/side effects for all the Medicines you take and that have been prescribed to you. Take any new Medicines after you have completely understood and accpet all the possible adverse reactions/side effects.   Do not drive when taking Pain medications or sleeping medications (Benzodaizepines)  Do not take more than prescribed Pain, Sleep and Anxiety Medications. It is not advisable to combine anxiety,sleep and pain medications without talking with your primary care practitioner  Special Instructions: If you have smoked or chewed Tobacco  in the last 2 yrs please stop smoking, stop any regular Alcohol  and or any Recreational drug use.  Wear Seat belts while driving.  Please note: You were cared for by a hospitalist during your hospital stay. Once you are discharged, your primary care physician will handle any further medical issues. Please note that NO REFILLS for any discharge medications will be authorized once you are discharged, as it is imperative that you return to your primary care physician (or establish a relationship with a primary care physician if you do not have one) for your post hospital discharge needs so that they can reassess your need for medications and monitor your lab values.  Total Time spent coordinating discharge including  counseling, education and face to face time  equals greater than 30 minutes.  SignedOren Binet 12/24/2022 11:46 AM

## 2022-12-24 NOTE — Progress Notes (Signed)
Physical Therapy Treatment Patient Details Name: Laura Atkins MRN: [id number] DOB: 1941-04-22 Today's Date: 12/24/2022   History of Present Illness Patient is a 82 y.o. female [facility] for worsening ulcer on R great toe. Further evaluation revealed osteomyelitis, no s/p R great toe amputation on 3/28. PMHx: HTN, HLD, DM-2, anxiety/depression, dementia    PT Comments    Pt's family present and planning to take pt home. Talked through all equipment available at home as well as processes that family has been using. Pt able to come to EOB with heavy use of rail and min A. She was able to maintain balance EOB with supervision. Pt has significant tremor with exertion, noted LE>UE. This limited pt's standing tolerance and her ability to step her feet. Recommended family get a transfer disc to help transfer pt to w/c. Pt stood with mod A to RW. Updating d/c rec to home with HHPT and family working to get additional support of [place]. PT will continue to follow.    Recommendations for follow up therapy are one component of a multi-disciplinary discharge planning process, led by the attending physician.  Recommendations may be updated based on patient status, additional functional criteria and insurance authorization.  Follow Up Recommendations  Can patient physically be transported by private vehicle: No    Assistance Recommended at Discharge Frequent or constant Supervision/Assistance  Patient can return home with the following Two people to help with walking and/or transfers;Assist for transportation;Help with stairs or ramp for entrance;Two people to help with bathing/dressing/bathroom   Equipment Recommendations  None recommended by PT    Recommendations for Other Services       Precautions / Restrictions Precautions Precautions: Fall Required Braces or Orthoses: Other Brace Other Brace: R post op shoe Restrictions Weight Bearing Restrictions: No RLE Weight Bearing:  Partial weight bearing RLE Partial Weight Bearing Percentage or Pounds: heel weight bearing Other Position/Activity Restrictions: heel weight bearing with post op shoe     Mobility  Bed Mobility Overal bed mobility: Needs Assistance Bed Mobility: Supine to Sit, Sit to Supine     Supine to sit: Min assist Sit to supine: Min assist   General bed mobility comments: with increased time pt able to come to EOB with heavy use of rail and HHA for end range elevation of trunk. Came up from flat bed and pt has bed rails at home. Needed min A to LE's for returning to supine    Transfers Overall transfer level: Needs assistance Equipment used: Rolling walker (2 wheels) Transfers: Sit to/from Stand Sit to Stand: Mod assist           General transfer comment: mod A for power up from elevated bed. Practiced 3x and got a little better each time. Pt with maximal shaking of UE's and LE's when in standing which limits her tolerance for remaining standing    Ambulation/Gait               General Gait Details: unable to step feet in standing   Stairs             Wheelchair Mobility    Modified Rankin (Stroke Patients Only)       Balance Overall balance assessment: Needs assistance Sitting-balance support: Bilateral upper extremity supported, Feet supported, No upper extremity supported Sitting balance-Leahy Scale: Fair Sitting balance - Comments: supervision at EOB once feet on floor   Standing balance support: Bilateral upper extremity supported, During functional activity Standing balance-Leahy Scale: Poor  Standing balance comment: mod A to maintain standing with RW                            Cognition Arousal/Alertness: Awake/alert Behavior During Therapy: WFL for tasks assessed/performed Overall Cognitive Status: History of cognitive impairments - at baseline Area of Impairment: Memory, Following commands, Problem solving, Awareness, Attention                    Current Attention Level: Selective   Following Commands: Follows one step commands inconsistently, Follows one step commands with increased time     Problem Solving: Slow processing, Decreased initiation, Requires verbal cues, Requires tactile cues General Comments: cues needed to remain focused on task. Pt very fearful of falling        Exercises Other Exercises Other Exercises: SLR 10x R and L Other Exercises: bridging x5 with feet blocked    General Comments General comments (skin integrity, edema, etc.): VSS on RA. Family present and feels that they can handle pt's current limitations at home with HHPT and aide. Educated family on transfer discs that can be purchased to ease transfers while pt having difficulty stepping feet      Pertinent Vitals/Pain Pain Assessment Pain Assessment: No/denies pain    Home Living                          Prior Function            PT Goals (current goals can now be found in the care plan section) Acute Rehab PT Goals Patient Stated Goal: get better PT Goal Formulation: With patient/family Time For Goal Achievement: 01/04/23 Potential to Achieve Goals: Fair Progress towards PT goals: Progressing toward goals    Frequency    Min 3X/week      PT Plan Discharge plan needs to be updated;Frequency needs to be updated    Co-evaluation              AM-PAC PT "6 Clicks" Mobility   Outcome Measure  Help needed turning from your back to your side while in a flat bed without using bedrails?: A Little Help needed moving from lying on your back to sitting on the side of a flat bed without using bedrails?: A Little Help needed moving to and from a bed to a chair (including a wheelchair)?: Total Help needed standing up from a chair using your arms (e.g., wheelchair or bedside chair)?: A Lot Help needed to walk in hospital room?: Total Help needed climbing 3-5 steps with a railing? : Total 6 Click  Score: 11    End of Session Equipment Utilized During Treatment: Gait belt Activity Tolerance: Patient tolerated treatment well Patient left: in bed;with call bell/phone within reach;with family/visitor present Nurse Communication: Mobility status PT Visit Diagnosis: Muscle weakness (generalized) (M62.81);Difficulty in walking, not elsewhere classified (R26.2)     Time: [id number] PT Time Calculation (min) (ACUTE ONLY): 28 min  Charges:  $Therapeutic Activity: 23-37 mins                     Leighton Roach, PT  Acute Rehab Services Secure chat preferred Office [place] 12/24/2022, 1:46 PM

## 2022-12-25 ENCOUNTER — Encounter: Payer: Self-pay | Admitting: *Deleted

## 2022-12-25 ENCOUNTER — Ambulatory Visit: Payer: Self-pay

## 2022-12-25 ENCOUNTER — Encounter: Payer: Self-pay | Admitting: Internal Medicine

## 2022-12-25 ENCOUNTER — Telehealth: Payer: Self-pay | Admitting: *Deleted

## 2022-12-25 LAB — AEROBIC/ANAEROBIC CULTURE W GRAM STAIN (SURGICAL/DEEP WOUND)
Culture: NO GROWTH
Gram Stain: NONE SEEN

## 2022-12-25 NOTE — Transitions of Care (Post Inpatient/ED Visit) (Signed)
12/25/2022  Name: Laura Atkins MRN: [id number] DOB: 05/29/1941  Today's TOC FU Call Status: Today's TOC FU Call Status:: Successful TOC FU Call Competed TOC FU Call Complete Date: 12/25/22  Transition Care Management Follow-up Telephone Call Date of Discharge: 12/24/22 Discharge Facility: [facility]) Type of Discharge: Inpatient Admission Primary Inpatient Discharge Diagnosis:: (R) toe amputation secondary to osteomyelitis How have you been since you were released from the hospital?: Same (per daughter/ caregiver: "We were very dissatisfied with the hospital care, I have already called and placed a grievance with the complaint department.  They were supposed to have set up home health services- no one has followed up with [place] about that") Any questions or concerns?: Yes Patient Questions/Concerns:: Patient/ family ended up declining SNF rehabilitation at hospital discharge; reports they were told home health services would be ordered, however no one has followed up with caregiver to provide agency name; caregiver adamantly declines ongoing care coordination outreaches; states she will place another complaint if they don't contact her like they said they would Patient Questions/Concerns Addressed: Other: (provided extensive service recovery efforts; discussed options for continuity of follow up around home health services- caregiver declined all)  Items Reviewed: Did you receive and understand the discharge instructions provided?: Yes (thoroughly reviewed with patient who verbalizes excellent understanding of same) Medications obtained and verified?: Yes (Medications Reviewed) (Declined full medication review; confirmed patient obtained/ is taking all newly Rx'd medications as instructed; caregiver/ family-manages medications and denies questions/ concerns around medications today) Medications Not Reviewed Reasons:: Other: (caregiver declined full medication review; confirms obtained and is  taking post-hospital discharge antibiotics as prescribed) Any new allergies since your discharge?: No Dietary orders reviewed?: Yes Type of Diet Ordered:: "kidney diet, heart healthy" Do you have support at home?: Yes People in Home: child(ren), adult, spouse Name of Support/Comfort Primary Source: [name]- daughter resides with patient; other family members assist while [name] is working; caregiver reports patient requires assistance for all self-care needs- family provides  Home Care and Equipment/Supplies: [place] Ordered?: No (unable to determine- as above-- reached out to hospital discharging provider to request follow up with caregiver; made PCP aware as FYI) Any new equipment or medical supplies ordered?: No  Functional Questionnaire: Do you need assistance with bathing/showering or dressing?: Yes (caregiver reports she/ family provide assistance for all care needs) Do you need assistance with meal preparation?: Yes (caregiver reports she/ family provide assistance for all care needs) Do you need assistance with eating?: Yes (caregiver reports she/ family provide assistance for all care needs) Do you have difficulty maintaining continence: Yes (caregiver reports she/ family provide assistance for all care needs) Do you need assistance with getting out of bed/getting out of a chair/moving?: Yes (caregiver reports she/ family provide assistance for all care needs) Do you have difficulty managing or taking your medications?: Yes (caregiver reports she/ family manage all aspects of medication administration)  Follow up appointments reviewed: PCP Follow-up appointment confirmed?: Yes (care coordination outreach in real-time with scheduling care guide to successfully schedule hospital follow up PCP appointment 01/01/23) Date of PCP follow-up appointment?: 01/01/23 Follow-up Provider: PCP [facility] Follow-up appointment confirmed?: Yes Date of Specialist follow-up  appointment?: 12/27/22 Follow-Up Specialty Provider:: renal specialist Do you need transportation to your follow-up appointment?: No Do you understand care options if your condition(s) worsen?: Yes-patient verbalized understanding  SDOH Interventions Today    Flowsheet Row Most Recent Value  SDOH Interventions   Food Insecurity Interventions Intervention Not Indicated  Transportation Interventions Intervention Not Indicated  [family provides transportation]      TOC Interventions Today    Flowsheet Row Most Recent Value  TOC Interventions   TOC Interventions Discussed/Reviewed TOC Interventions Discussed, Arranged PCP follow up within 7 days/Care Guide scheduled  [Caregiver adamantly declines need for ongoing/ further care coordination outreach,  no care coordination needs identified at time of TOC call today,  provided my direct contact information should questions/ concerns/ needs arise post-TOC call]      Interventions Today    Flowsheet Row Most Recent Value  Chronic Disease   Chronic disease during today's visit Other  [(R) toe amputation secondary to osteomyelitis]  General Interventions   General Interventions Discussed/Reviewed General Interventions Discussed, Doctor Visits  Doctor Visits Discussed/Reviewed Specialist, Doctor Visits Discussed, PCP  PCP/Specialist Visits Compliance with follow-up visit  Exercise Interventions   Exercise Discussed/Reviewed Exercise Discussed  [home health services]  Nutrition Interventions   Nutrition Discussed/Reviewed Nutrition Discussed  Pharmacy Interventions   Pharmacy Dicussed/Reviewed Pharmacy Topics Discussed      Oneta Rack, RN, BSN, CCRN Alumnus RN CM Care Coordination/ Transition of [place] Management [phone]: direct office

## 2022-12-25 NOTE — Chronic Care Management (AMB) (Signed)
   12/25/2022  Laura Atkins Jun 03, 1941 [id number]   Reason for Encounter: Patient is not currently enrolled in the CCM program. CCM status changed to previously enrolled.   Horris Latino RN Care Manager/Chronic Care Management [phone]

## 2022-12-26 ENCOUNTER — Telehealth: Payer: Self-pay | Admitting: Family Medicine

## 2022-12-26 NOTE — Telephone Encounter (Signed)
Direct contact made with daughter who requests evaluation by Neurology re abnormal behavior, increased aggression,evaluation for dementia worsening in past 6 months approx

## 2022-12-26 NOTE — Telephone Encounter (Signed)
Gave verbal order to start nursing services as well

## 2022-12-26 NOTE — Telephone Encounter (Signed)
[place] called from [facility] [phone].  She received an order from hospital and it is only OT and PT and patient has a wound and multiple other medical issues, trying to send out a nurse as well.

## 2022-12-27 ENCOUNTER — Encounter (HOSPITAL_COMMUNITY)
Admission: RE | Admit: 2022-12-27 | Discharge: 2022-12-27 | Disposition: A | Discharge status: Home / Self Care | Payer: Medicare PPO | Source: Ambulatory Visit | Attending: Nephrology | Admitting: Nephrology

## 2022-12-27 VITALS — BP 133/58 | HR 78 | Temp 98.0°F | Resp 19

## 2022-12-27 DIAGNOSIS — N183 Chronic kidney disease, stage 3 unspecified: Secondary | ICD-10-CM | POA: Diagnosis not present

## 2022-12-27 DIAGNOSIS — D631 Anemia in chronic kidney disease: Secondary | ICD-10-CM | POA: Insufficient documentation

## 2022-12-27 DIAGNOSIS — E1122 Type 2 diabetes mellitus with diabetic chronic kidney disease: Secondary | ICD-10-CM | POA: Diagnosis not present

## 2022-12-27 DIAGNOSIS — D638 Anemia in other chronic diseases classified elsewhere: Secondary | ICD-10-CM | POA: Diagnosis not present

## 2022-12-27 DIAGNOSIS — N189 Chronic kidney disease, unspecified: Secondary | ICD-10-CM | POA: Diagnosis not present

## 2022-12-27 DIAGNOSIS — R600 Localized edema: Secondary | ICD-10-CM | POA: Diagnosis not present

## 2022-12-27 DIAGNOSIS — D560 Alpha thalassemia: Secondary | ICD-10-CM | POA: Diagnosis not present

## 2022-12-27 DIAGNOSIS — I129 Hypertensive chronic kidney disease with stage 1 through stage 4 chronic kidney disease, or unspecified chronic kidney disease: Secondary | ICD-10-CM | POA: Diagnosis not present

## 2022-12-27 LAB — CBC WITH DIFFERENTIAL/PLATELET
Abs Immature Granulocytes: 0.03 10*3/uL (ref 0.00–0.07)
Basophils Absolute: 0 10*3/uL (ref 0.0–0.1)
Basophils Relative: 1 %
Eosinophils Absolute: 0.5 10*3/uL (ref 0.0–0.5)
Eosinophils Relative: 5 %
HCT: 35.8 % — ABNORMAL LOW (ref 36.0–46.0)
Hemoglobin: 10.4 g/dL — ABNORMAL LOW (ref 12.0–15.0)
Immature Granulocytes: 0 %
Lymphocytes Relative: 25 %
Lymphs Abs: 2.2 10*3/uL (ref 0.7–4.0)
MCH: 20.9 pg — ABNORMAL LOW (ref 26.0–34.0)
MCHC: 29.1 g/dL — ABNORMAL LOW (ref 30.0–36.0)
MCV: 72 fL — ABNORMAL LOW (ref 80.0–100.0)
Monocytes Absolute: 0.4 10*3/uL (ref 0.1–1.0)
Monocytes Relative: 5 %
Neutro Abs: 5.7 10*3/uL (ref 1.7–7.7)
Neutrophils Relative %: 64 %
Platelets: 355 10*3/uL (ref 150–400)
RBC: 4.97 MIL/uL (ref 3.87–5.11)
RDW: 21.4 % — ABNORMAL HIGH (ref 11.5–15.5)
WBC: 8.8 10*3/uL (ref 4.0–10.5)
nRBC: 0 % (ref 0.0–0.2)

## 2022-12-27 LAB — RENAL FUNCTION PANEL
Albumin: 3.4 g/dL — ABNORMAL LOW (ref 3.5–5.0)
Anion gap: 9 (ref 5–15)
BUN: 35 mg/dL — ABNORMAL HIGH (ref 8–23)
CO2: 32 mmol/L (ref 22–32)
Calcium: 8.7 mg/dL — ABNORMAL LOW (ref 8.9–10.3)
Chloride: 96 mmol/L — ABNORMAL LOW (ref 98–111)
Creatinine, Ser: 1.74 mg/dL — ABNORMAL HIGH (ref 0.44–1.00)
GFR, Estimated: 29 mL/min — ABNORMAL LOW (ref >60)
Glucose, Bld: 215 mg/dL — ABNORMAL HIGH (ref 70–99)
Phosphorus: 3.6 mg/dL (ref 2.5–4.6)
Potassium: 3.1 mmol/L — ABNORMAL LOW (ref 3.5–5.1)
Sodium: 137 mmol/L (ref 135–145)

## 2022-12-27 LAB — FERRITIN: Ferritin: 62 ng/mL (ref 11–307)

## 2022-12-27 LAB — IRON AND TIBC
Iron: 47 ug/dL (ref 28–170)
Saturation Ratios: 13 % (ref 10.4–31.8)
TIBC: 357 ug/dL (ref 250–450)
UIBC: 310 ug/dL

## 2022-12-27 LAB — POCT HEMOGLOBIN-HEMACUE: Hemoglobin: 10.4 g/dL — ABNORMAL LOW (ref 12.0–15.0)

## 2022-12-27 MED ORDER — EPOETIN ALFA-EPBX 10000 UNIT/ML IJ SOLN: 10000.0000 [IU] | Freq: Once | INTRAMUSCULAR | Status: DC

## 2022-12-27 NOTE — Progress Notes (Signed)
Diagnosis: Anemia in Chronic Kidney Disease  Provider:  Manpreet Bhutani MD  Procedure: Injection  Retacrit (epoetin alfa-epbx), Dose: 10000 Units, Site: subcutaneous, Number of injections: 0  HGB 10.4 Injection not given   Post Care: Patient declined observation  Discharge: Condition: Good, Destination: Home . AVS Provided  Performed by:  Grayland Ormond, RN

## 2022-12-28 LAB — PARATHYROID HORMONE, INTACT (NO CA): PTH: 83 pg/mL — ABNORMAL HIGH (ref 15–65)

## 2022-12-29 DIAGNOSIS — F0394 Unspecified dementia, unspecified severity, with anxiety: Secondary | ICD-10-CM | POA: Diagnosis not present

## 2022-12-29 DIAGNOSIS — Z4781 Encounter for orthopedic aftercare following surgical amputation: Secondary | ICD-10-CM | POA: Diagnosis not present

## 2022-12-29 DIAGNOSIS — E1165 Type 2 diabetes mellitus with hyperglycemia: Secondary | ICD-10-CM | POA: Diagnosis not present

## 2022-12-29 DIAGNOSIS — E1122 Type 2 diabetes mellitus with diabetic chronic kidney disease: Secondary | ICD-10-CM | POA: Diagnosis not present

## 2022-12-29 DIAGNOSIS — I130 Hypertensive heart and chronic kidney disease with heart failure and stage 1 through stage 4 chronic kidney disease, or unspecified chronic kidney disease: Secondary | ICD-10-CM | POA: Diagnosis not present

## 2022-12-29 DIAGNOSIS — N1832 Chronic kidney disease, stage 3b: Secondary | ICD-10-CM | POA: Diagnosis not present

## 2022-12-29 DIAGNOSIS — I5033 Acute on chronic diastolic (congestive) heart failure: Secondary | ICD-10-CM | POA: Diagnosis not present

## 2022-12-29 DIAGNOSIS — E1169 Type 2 diabetes mellitus with other specified complication: Secondary | ICD-10-CM | POA: Diagnosis not present

## 2022-12-29 DIAGNOSIS — M86171 Other acute osteomyelitis, right ankle and foot: Secondary | ICD-10-CM | POA: Diagnosis not present

## 2023-01-01 ENCOUNTER — Telehealth: Payer: Self-pay | Admitting: Family Medicine

## 2023-01-01 ENCOUNTER — Telehealth: Payer: Medicare PPO | Admitting: Family Medicine

## 2023-01-01 NOTE — Telephone Encounter (Signed)
Verbal order given  

## 2023-01-01 NOTE — Telephone Encounter (Signed)
New message  Need nurse orders   One week x 4   Disease and monitor right great toe amputee site.

## 2023-01-02 LAB — GLUCOSE, CAPILLARY
Glucose-Capillary: 135 mg/dL — ABNORMAL HIGH (ref 70–99)
Glucose-Capillary: 115 mg/dL — ABNORMAL HIGH (ref 70–99)
Glucose-Capillary: 145 mg/dL — ABNORMAL HIGH (ref 70–99)
Glucose-Capillary: 124 mg/dL — ABNORMAL HIGH (ref 70–99)

## 2023-01-03 ENCOUNTER — Telehealth: Payer: Self-pay | Admitting: Family Medicine

## 2023-01-03 NOTE — Telephone Encounter (Signed)
Denzil Magnuson, physical therapist w. [facility] called in on patient behalf.  Per patient request  Evaluation moved to next Wednesday   Call back* [phone]

## 2023-01-04 ENCOUNTER — Encounter: Payer: Self-pay | Admitting: Podiatry

## 2023-01-04 ENCOUNTER — Ambulatory Visit (INDEPENDENT_AMBULATORY_CARE_PROVIDER_SITE_OTHER): Payer: Medicare PPO

## 2023-01-04 ENCOUNTER — Ambulatory Visit: Payer: Medicare PPO | Admitting: Podiatry

## 2023-01-04 DIAGNOSIS — Z89421 Acquired absence of other right toe(s): Secondary | ICD-10-CM

## 2023-01-04 DIAGNOSIS — Z9889 Other specified postprocedural states: Secondary | ICD-10-CM

## 2023-01-04 DIAGNOSIS — M86171 Other acute osteomyelitis, right ankle and foot: Secondary | ICD-10-CM | POA: Diagnosis not present

## 2023-01-04 DIAGNOSIS — M19071 Primary osteoarthritis, right ankle and foot: Secondary | ICD-10-CM | POA: Diagnosis not present

## 2023-01-04 NOTE — Progress Notes (Signed)
Subjective:  Patient ID: Laura Atkins, female    DOB: [birth year],  MRN: [id number]  Chief Complaint  Patient presents with   Post-op Follow-up    DOS: 12/20/22            Procedure: Right hallux partial amputation      DOS: 12/20/22  Procedure: Right hallux partial amputation   82 y.o. female returns for POV#1. Relates doing well with minimal pain.   Review of Systems: Negative except as noted in the HPI. Denies N/V/F/Ch.  Past Medical History:  Diagnosis Date   Alpha thalassemia trait 01/26/2010   02/2012: Nl CBC ex H&H-10.7/34.8, MCV-69    Anemia    Anxiety    Anxiety and depression    Cellulitis 05/09/2017   Depression    Diabetes mellitus    Foot pain, right 04/30/2013   GERD (gastroesophageal reflux disease)    Headache(784.0)    Hyperlipidemia    Hypertension    Iron deficiency 01/21/2017   Microcytic anemia 01/26/2010   02/2012: Nl CBC ex H&H-10.7/34.8, MCV-69    NECK PAIN, CHRONIC 10/21/2008   +chronic back pain     Obesity    Obstructive sleep apnea    Osteoarthritis    Left knee; right shoulder; chronic neck and back pain   Pruritus    PVD (peripheral vascular disease) 01/28/2014   Seizures    Shoulder pain, right 04/14/2015   Tremor    This started months ago after her seizure progressing to very poor hand writing   Urinary incontinence    UTI (urinary tract infection) 01/18/2013    Current Outpatient Medications:    Accu-Chek FastClix Lancets MISC, 1 each by Does not apply route 4 (four) times daily. Use to monitor glucose levels 4 times daily; E11.65, Disp: 306 each, Rfl: 2   Alcohol Swabs (B-D SINGLE USE SWABS REGULAR) PADS, 1 each by Does not apply route 4 (four) times daily. Use to prep site for glucose monitoring 4 times daily; E11.65, Disp: 300 each, Rfl: 2   amLODipine (NORVASC) 5 MG tablet, Take 1.5 tablets (7.5 mg total) by mouth daily., Disp: 135 tablet, Rfl: 1   aspirin 81 MG tablet, Take 81 mg by mouth daily. (Patient not taking:  Reported on 12/19/2022), Disp: , Rfl:    atorvastatin (LIPITOR) 40 MG tablet, TAKE 1 TABLET BY MOUTH EVERY DAY (Patient taking differently: Take 40 mg by mouth daily.), Disp: 30 tablet, Rfl: 2   benazepril (LOTENSIN) 20 MG tablet, TAKE 1 TABLET BY MOUTH EVERY DAY (Patient taking differently: Take 20 mg by mouth daily.), Disp: 30 tablet, Rfl: 5   Blood Glucose Monitoring Suppl (ACCU-CHEK GUIDE ME) w/Device KIT, 1 each by Does not apply route 4 (four) times daily. Use to monitor glucose levels 4 times daily; E11.65, Disp: 1 kit, Rfl: 0   buPROPion (WELLBUTRIN XL) 150 MG 24 hr tablet, TAKE 1 TABLET BY MOUTH EVERY MORNING, Disp: 90 tablet, Rfl: 2   Cholecalciferol 125 MCG (5000 UT) TABS, Take 5,000 Units by mouth daily., Disp: , Rfl:    Cyanocobalamin (VITAMIN B-12) 5000 MCG TBDP, Take 5,000 mcg by mouth daily., Disp: , Rfl:    diphenhydrAMINE (BENADRYL) 25 MG tablet, Take 25 mg by mouth at bedtime., Disp: , Rfl:    donepezil (ARICEPT) 10 MG tablet, Take 1 tablet (10 mg total) by mouth at bedtime., Disp: 30 tablet, Rfl: 3   gabapentin (NEURONTIN) 400 MG capsule, TAKE ONE CAPSULE BY MOUTH EVERY MORNING and TAKE ONE CAPSULE AT  NOON and TAKE TWO CAPSULES AT BEDTIME (Patient taking differently: Take 400-800 mg by mouth See admin instructions. TAKE ONE CAPSULE BY MOUTH EVERY MORNING and TAKE ONE CAPSULE AT NOON and TAKE TWO CAPSULES AT BEDTIME), Disp: 360 capsule, Rfl: 1   glucose blood (ACCU-CHEK GUIDE) test strip, USE TO CHECK BLOOD SUGAR FOUR TIMES DAILY, Disp: 200 strip, Rfl: 1   glucose blood test strip, 1 each by Other route in the morning, at noon, in the evening, and at bedtime. Use as instructed qid. E11.65, Disp: , Rfl:    Incontinence Supply Disposable (TRANQUILITY UNDERGARMENTS) MISC, 1 each by Does not apply route daily., Disp: 90 each, Rfl: 3   insulin degludec (TRESIBA FLEXTOUCH) 200 UNIT/ML FlexTouch Pen, Inject 12 Units into the skin at bedtime., Disp: 12 mL, Rfl: 3   Insulin Pen Needle (EASY  TOUCH PEN NEEDLES) 31G X 5 MM MISC, USE TO INJECT INSULIN ONCE DAILY, Disp: 100 each, Rfl: 2   Insulin Syringe-Needle U-100 (INSULIN SYRINGE 1CC/31GX5/16") 31G X 5/16" 1 ML MISC, 1 each by Does not apply route daily. Use to inject insulin daily; E11.65, Disp: 90 each, Rfl: 2   Magnesium 250 MG TABS, Take 500 mg by mouth daily., Disp: , Rfl:    Multiple Vitamin (MULTIVITAMIN) capsule, Take 1 capsule by mouth daily., Disp: , Rfl:    NONFORMULARY OR COMPOUNDED ITEM, Apothecary hemorrhoid cream. Apply rectally up to qid as directed., Disp: , Rfl:    omeprazole (PRILOSEC) 20 MG capsule, TAKE ONE CAPSULE BY MOUTH EVERY DAY (Patient taking differently: Take 20 mg by mouth daily.), Disp: 30 capsule, Rfl: 2   sertraline (ZOLOFT) 100 MG tablet, TAKE 1 TABLET BY MOUTH DAILY, Disp: 90 tablet, Rfl: 2   torsemide (DEMADEX) 20 MG tablet, TAKE 2 TABLETS BY MOUTH EVERY DAY and additionally TAKE 1 TABLET EVERY OTHER DAY (Patient taking differently: Take 40 mg by mouth 2 (two) times daily.), Disp: 225 tablet, Rfl: 2   traZODone (DESYREL) 50 MG tablet, TAKE 1/2 TABLET BY MOUTH AT BEDTIME FOR SLEEP (Patient taking differently: Take 50 mg by mouth at bedtime.), Disp: 15 tablet, Rfl: 3   UNABLE TO FIND, Walker x 1  DX unsteady gait, osteoarthritis of left knee, Disp: 1 each, Rfl: 0   UNABLE TO FIND, Elevated Toliet Seat x 1 DX: unsteady gait, back pain, osteoarthritis of left knee, Disp: 1 each, Rfl: 0   UNABLE TO FIND, Standing upright walker x 1  DX M54.40, M19.90, Disp: 1 each, Rfl: 0   UNABLE TO FIND, Incontinence pads and supplies, Disp: 1 each, Rfl: 1   UNABLE TO FIND, Rollator Walker, Disp: 1 Product, Rfl: 0   UNABLE TO FIND, XL Tranquility Briefs for incontinence., Disp: 1 Package, Rfl: 3   UNABLE TO FIND, Premium overnight disposible absorbant underwear XL UPC- [id number], Disp: 14 each, Rfl: 11  Social History   Tobacco Use  Smoking Status Never   Passive exposure: Past  Smokeless Tobacco Never     Allergies  Allergen Reactions   Penicillins Shortness Of Breath, Itching and Rash   Prednisone Shortness Of Breath, Itching and Rash   Propoxyphene N-Acetaminophen Itching and Nausea And Vomiting    Darvocet    Gentamicin Rash    Cream   Sulfa Antibiotics Itching and Nausea And Vomiting   Objective:  There were no vitals filed for this visit. There is no height or weight on file to calculate BMI. Constitutional Well developed. Well nourished.  Vascular Foot warm and well  perfused. Capillary refill normal to all digits.   Neurologic Normal speech. Oriented to person, place, and time. Epicritic sensation to light touch grossly present bilaterally.  Dermatologic Skin healing well without signs of infection. Skin edges well coapted without signs of infection.  Orthopedic: Tenderness to palpation noted about the surgical site.   Radiographs: Interval amputation of right hallux at level of IPJ.  Assessment:   1. Post-operative state    Plan:  Patient was evaluated and treated and all questions answered.  S/p foot surgery right -Progressing as expected post-operatively. -WB Status: heel WB in surgical shoe -Sutures: intact. -Medications: n/a -Foot redressed.  Follow-up 2 weeks for suture removal.   Return in about 2 weeks (around 01/18/2023) for post op.

## 2023-01-04 NOTE — Telephone Encounter (Signed)
Noted  

## 2023-01-09 ENCOUNTER — Telehealth: Payer: Self-pay

## 2023-01-09 ENCOUNTER — Other Ambulatory Visit: Payer: Self-pay | Admitting: Podiatry

## 2023-01-09 ENCOUNTER — Telehealth: Payer: Self-pay | Admitting: Family Medicine

## 2023-01-09 DIAGNOSIS — E1169 Type 2 diabetes mellitus with other specified complication: Secondary | ICD-10-CM | POA: Diagnosis not present

## 2023-01-09 DIAGNOSIS — F0394 Unspecified dementia, unspecified severity, with anxiety: Secondary | ICD-10-CM | POA: Diagnosis not present

## 2023-01-09 DIAGNOSIS — E1165 Type 2 diabetes mellitus with hyperglycemia: Secondary | ICD-10-CM | POA: Diagnosis not present

## 2023-01-09 DIAGNOSIS — I130 Hypertensive heart and chronic kidney disease with heart failure and stage 1 through stage 4 chronic kidney disease, or unspecified chronic kidney disease: Secondary | ICD-10-CM | POA: Diagnosis not present

## 2023-01-09 DIAGNOSIS — N1832 Chronic kidney disease, stage 3b: Secondary | ICD-10-CM | POA: Diagnosis not present

## 2023-01-09 DIAGNOSIS — E1122 Type 2 diabetes mellitus with diabetic chronic kidney disease: Secondary | ICD-10-CM | POA: Diagnosis not present

## 2023-01-09 DIAGNOSIS — Z4781 Encounter for orthopedic aftercare following surgical amputation: Secondary | ICD-10-CM | POA: Diagnosis not present

## 2023-01-09 DIAGNOSIS — M86171 Other acute osteomyelitis, right ankle and foot: Secondary | ICD-10-CM | POA: Diagnosis not present

## 2023-01-09 DIAGNOSIS — I5033 Acute on chronic diastolic (congestive) heart failure: Secondary | ICD-10-CM | POA: Diagnosis not present

## 2023-01-09 MED ORDER — TRAMADOL HCL 50 MG PO TABS: 50.0000 mg | ORAL_TABLET | Freq: Three times a day (TID) | ORAL | 0 refills | Status: AC | PRN

## 2023-01-09 NOTE — Telephone Encounter (Signed)
Encounter created in error

## 2023-01-09 NOTE — Telephone Encounter (Signed)
Laura Atkins, physical therapist, w [facility] called ion on patient behalf.  Still has not seen patient for initial PT visit  Has tired 3 times.  Pt refused again today   Will try again next week   Call back info [place] [phone]

## 2023-01-09 NOTE — Telephone Encounter (Signed)
Sent in to [facility]. Thanks

## 2023-01-10 ENCOUNTER — Encounter: Payer: Self-pay | Admitting: Diagnostic Neuroimaging

## 2023-01-10 ENCOUNTER — Encounter (HOSPITAL_COMMUNITY)
Admission: RE | Admit: 2023-01-10 | Discharge: 2023-01-10 | Disposition: A | Discharge status: Home / Self Care | Payer: Medicare PPO | Source: Ambulatory Visit | Attending: Nephrology | Admitting: Nephrology

## 2023-01-10 ENCOUNTER — Ambulatory Visit: Payer: Medicare PPO | Admitting: Diagnostic Neuroimaging

## 2023-01-10 VITALS — BP 141/57 | HR 66 | Temp 98.1°F | Resp 18

## 2023-01-10 VITALS — BP 137/54 | HR 70 | Ht 68.0 in | Wt 227.0 lb

## 2023-01-10 DIAGNOSIS — M86171 Other acute osteomyelitis, right ankle and foot: Secondary | ICD-10-CM | POA: Diagnosis not present

## 2023-01-10 DIAGNOSIS — E1122 Type 2 diabetes mellitus with diabetic chronic kidney disease: Secondary | ICD-10-CM | POA: Diagnosis not present

## 2023-01-10 DIAGNOSIS — Z794 Long term (current) use of insulin: Secondary | ICD-10-CM

## 2023-01-10 DIAGNOSIS — N183 Chronic kidney disease, stage 3 unspecified: Secondary | ICD-10-CM | POA: Diagnosis not present

## 2023-01-10 DIAGNOSIS — I5033 Acute on chronic diastolic (congestive) heart failure: Secondary | ICD-10-CM | POA: Diagnosis not present

## 2023-01-10 DIAGNOSIS — F03B18 Unspecified dementia, moderate, with other behavioral disturbance: Secondary | ICD-10-CM

## 2023-01-10 DIAGNOSIS — D631 Anemia in chronic kidney disease: Secondary | ICD-10-CM | POA: Diagnosis not present

## 2023-01-10 DIAGNOSIS — Z4781 Encounter for orthopedic aftercare following surgical amputation: Secondary | ICD-10-CM | POA: Diagnosis not present

## 2023-01-10 DIAGNOSIS — Z89411 Acquired absence of right great toe: Secondary | ICD-10-CM

## 2023-01-10 DIAGNOSIS — Z7982 Long term (current) use of aspirin: Secondary | ICD-10-CM

## 2023-01-10 DIAGNOSIS — E785 Hyperlipidemia, unspecified: Secondary | ICD-10-CM

## 2023-01-10 DIAGNOSIS — D563 Thalassemia minor: Secondary | ICD-10-CM

## 2023-01-10 DIAGNOSIS — F32A Depression, unspecified: Secondary | ICD-10-CM

## 2023-01-10 DIAGNOSIS — E1165 Type 2 diabetes mellitus with hyperglycemia: Secondary | ICD-10-CM | POA: Diagnosis not present

## 2023-01-10 DIAGNOSIS — F0393 Unspecified dementia, unspecified severity, with mood disturbance: Secondary | ICD-10-CM

## 2023-01-10 DIAGNOSIS — Z6832 Body mass index (BMI) 32.0-32.9, adult: Secondary | ICD-10-CM

## 2023-01-10 DIAGNOSIS — G4733 Obstructive sleep apnea (adult) (pediatric): Secondary | ICD-10-CM

## 2023-01-10 DIAGNOSIS — I130 Hypertensive heart and chronic kidney disease with heart failure and stage 1 through stage 4 chronic kidney disease, or unspecified chronic kidney disease: Secondary | ICD-10-CM | POA: Diagnosis not present

## 2023-01-10 DIAGNOSIS — E1169 Type 2 diabetes mellitus with other specified complication: Secondary | ICD-10-CM | POA: Diagnosis not present

## 2023-01-10 DIAGNOSIS — F0394 Unspecified dementia, unspecified severity, with anxiety: Secondary | ICD-10-CM | POA: Diagnosis not present

## 2023-01-10 DIAGNOSIS — N1832 Chronic kidney disease, stage 3b: Secondary | ICD-10-CM | POA: Diagnosis not present

## 2023-01-10 DIAGNOSIS — Z9181 History of falling: Secondary | ICD-10-CM

## 2023-01-10 LAB — POCT HEMOGLOBIN-HEMACUE: Hemoglobin: 9.6 g/dL — ABNORMAL LOW (ref 12.0–15.0)

## 2023-01-10 MED ORDER — EPOETIN ALFA-EPBX 10000 UNIT/ML IJ SOLN
10000.0000 [IU] | Freq: Once | INTRAMUSCULAR | Status: AC
  Administered 2023-01-10: 10000 [IU] via SUBCUTANEOUS

## 2023-01-10 MED ORDER — QUETIAPINE FUMARATE 25 MG PO TABS: 25.0000 mg | ORAL_TABLET | Freq: Every evening | ORAL | 12 refills | Status: DC | PRN

## 2023-01-10 NOTE — Progress Notes (Signed)
[facility]  PATIENT: Laura Atkins DOB: August 15, 1941  REFERRING CLINICIAN: Kerri Perches, MD HISTORY FROM: patient  REASON FOR VISIT: follow up   HISTORICAL  CHIEF COMPLAINT:  Chief Complaint  Patient presents with   New Patient (Initial Visit)    Patient in room #6 with her daughters. Patient states for a f/u from her hospital visit and discuss how her Rx isn't helping her sleep.    HISTORY OF PRESENT ILLNESS:   UPDATE (01/10/23, VRP): Since last visit, more progression of dementia. More behavior and agitation issues. More problems with sleep. Seems to be better today with better pain control last night with tramadol. Trazodone not helping. Continues on bupropion and sertraline.   PRIOR HPI (3153): 82 year old female here for evaluation of dementia.  Patient was hospitalized recently in September 2021 for seizure, hyperglycemia, UTI and AKI.  MRI showed no acute findings.  EEG showed encephalopathy.  Neurology consult was obtained.  Patient was also noted to have seizure in 2013, likely related to Xanax withdrawal.  Recent seizure was likely related to metabolic encephalopathy and UTI.  Therefore antiseizure medication was not recommended.  Patient also has had some memory problems going back several years.  Review of electronic medical records back to 2013 indicate some possible dementia and memory loss problems at that time.  Symptoms have progressed.  Patient's daughter is now living with her since 2018.  Patient has had progressive short-term memory loss confusion, paranoia, behavior changes, decline in ADLs, physical and cognitive decline over the past 2 to 3 years.  Since recent hospitalization patient has significant decline.  Patient's daughter and other family members are helping with day-to-day functioning.    REVIEW OF SYSTEMS: Full 14 system review of systems performed and negative with exception of: As per HPI.  ALLERGIES: Allergies   Allergen Reactions   Penicillins Shortness Of Breath, Itching and Rash   Prednisone Shortness Of Breath, Itching and Rash   Propoxyphene N-Acetaminophen Itching and Nausea And Vomiting    Darvocet    Gentamicin Rash    Cream   Sulfa Antibiotics Itching and Nausea And Vomiting    HOME MEDICATIONS: Outpatient Medications Prior to Visit  Medication Sig Dispense Refill   Accu-Chek FastClix Lancets MISC 1 each by Does not apply route 4 (four) times daily. Use to monitor glucose levels 4 times daily; E11.65 306 each 2   Alcohol Swabs (B-D SINGLE USE SWABS REGULAR) PADS 1 each by Does not apply route 4 (four) times daily. Use to prep site for glucose monitoring 4 times daily; E11.65 300 each 2   amLODipine (NORVASC) 5 MG tablet Take 1.5 tablets (7.5 mg total) by mouth daily. 135 tablet 1   atorvastatin (LIPITOR) 40 MG tablet TAKE 1 TABLET BY MOUTH EVERY DAY (Patient taking differently: Take 40 mg by mouth daily.) 30 tablet 2   benazepril (LOTENSIN) 20 MG tablet TAKE 1 TABLET BY MOUTH EVERY DAY (Patient taking differently: Take 20 mg by mouth daily.) 30 tablet 5   Blood Glucose Monitoring Suppl (ACCU-CHEK GUIDE ME) w/Device KIT 1 each by Does not apply route 4 (four) times daily. Use to monitor glucose levels 4 times daily; E11.65 1 kit 0   buPROPion (WELLBUTRIN XL) 150 MG 24 hr tablet TAKE 1 TABLET BY MOUTH EVERY MORNING 90 tablet 2   Cholecalciferol 125 MCG (5000 UT) TABS Take 5,000 Units by mouth daily.     Cyanocobalamin (VITAMIN B-12) 5000 MCG TBDP Take 5,000 mcg by mouth daily.  diphenhydrAMINE (BENADRYL) 25 MG tablet Take 25 mg by mouth at bedtime.     donepezil (ARICEPT) 10 MG tablet Take 1 tablet (10 mg total) by mouth at bedtime. 30 tablet 3   gabapentin (NEURONTIN) 400 MG capsule TAKE ONE CAPSULE BY MOUTH EVERY MORNING and TAKE ONE CAPSULE AT NOON and TAKE TWO CAPSULES AT BEDTIME (Patient taking differently: Take 400-800 mg by mouth See admin instructions. TAKE ONE CAPSULE BY MOUTH  EVERY MORNING and TAKE ONE CAPSULE AT NOON and TAKE TWO CAPSULES AT BEDTIME) 360 capsule 1   glucose blood (ACCU-CHEK GUIDE) test strip USE TO CHECK BLOOD SUGAR FOUR TIMES DAILY 200 strip 1   glucose blood test strip 1 each by Other route in the morning, at noon, in the evening, and at bedtime. Use as instructed qid. E11.65     Incontinence Supply Disposable (TRANQUILITY UNDERGARMENTS) MISC 1 each by Does not apply route daily. 90 each 3   insulin degludec (TRESIBA FLEXTOUCH) 200 UNIT/ML FlexTouch Pen Inject 12 Units into the skin at bedtime. 12 mL 3   Insulin Pen Needle (EASY TOUCH PEN NEEDLES) 31G X 5 MM MISC USE TO INJECT INSULIN ONCE DAILY 100 each 2   Insulin Syringe-Needle U-100 (INSULIN SYRINGE 1CC/31GX5/16") 31G X 5/16" 1 ML MISC 1 each by Does not apply route daily. Use to inject insulin daily; E11.65 90 each 2   Magnesium 250 MG TABS Take 500 mg by mouth daily.     Multiple Vitamin (MULTIVITAMIN) capsule Take 1 capsule by mouth daily.     NONFORMULARY OR COMPOUNDED ITEM Apothecary hemorrhoid cream. Apply rectally up to qid as directed.     omeprazole (PRILOSEC) 20 MG capsule TAKE ONE CAPSULE BY MOUTH EVERY DAY (Patient taking differently: Take 20 mg by mouth daily.) 30 capsule 2   sertraline (ZOLOFT) 100 MG tablet TAKE 1 TABLET BY MOUTH DAILY 90 tablet 2   torsemide (DEMADEX) 20 MG tablet TAKE 2 TABLETS BY MOUTH EVERY DAY and additionally TAKE 1 TABLET EVERY OTHER DAY (Patient taking differently: Take 40 mg by mouth 2 (two) times daily.) 225 tablet 2   traMADol (ULTRAM) 50 MG tablet Take 1 tablet (50 mg total) by mouth every 8 (eight) hours as needed for up to 5 days. 15 tablet 0   UNABLE TO FIND Walker x 1  DX unsteady gait, osteoarthritis of left knee 1 each 0   UNABLE TO FIND Elevated Toliet Seat x 1 DX: unsteady gait, back pain, osteoarthritis of left knee 1 each 0   UNABLE TO FIND Standing upright walker x 1  DX M54.40, M19.90 1 each 0   UNABLE TO FIND Incontinence pads and  supplies 1 each 1   UNABLE TO FIND Rollator Walker 1 Product 0   UNABLE TO FIND XL Tranquility Briefs for incontinence. 1 Package 3   UNABLE TO FIND Premium overnight disposible absorbant underwear XL UPC- [id number] 14 each 11   traZODone (DESYREL) 50 MG tablet TAKE 1/2 TABLET BY MOUTH AT BEDTIME FOR SLEEP (Patient taking differently: Take 50 mg by mouth at bedtime.) 15 tablet 3   aspirin 81 MG tablet Take 81 mg by mouth daily. (Patient not taking: Reported on 12/19/2022)     No facility-administered medications prior to visit.    PAST MEDICAL HISTORY: Past Medical History:  Diagnosis Date   Alpha thalassemia trait 01/26/2010   02/2012: Nl CBC ex H&H-10.7/34.8, MCV-69    Anemia    Anxiety    Anxiety and depression  Cellulitis 05/09/2017   Depression    Diabetes mellitus    Foot pain, right 04/30/2013   GERD (gastroesophageal reflux disease)    Headache(784.0)    Hyperlipidemia    Hypertension    Iron deficiency 01/21/2017   Microcytic anemia 01/26/2010   02/2012: Nl CBC ex H&H-10.7/34.8, MCV-69    NECK PAIN, CHRONIC 10/21/2008   +chronic back pain     Obesity    Obstructive sleep apnea    Osteoarthritis    Left knee; right shoulder; chronic neck and back pain   Pruritus    PVD (peripheral vascular disease) 01/28/2014   Seizures    Shoulder pain, right 04/14/2015   Tremor    This started months ago after her seizure progressing to very poor hand writing   Urinary incontinence    UTI (urinary tract infection) 01/18/2013    PAST SURGICAL HISTORY: Past Surgical History:  Procedure Laterality Date   ABDOMINAL HYSTERECTOMY     AMPUTATION TOE Right 12/20/2022   Procedure: RIGHT TOE AMPUTATION TOE;  Surgeon: Louann Sjogren, DPM;  Location: [facility] OR;  Service: Podiatry;  Laterality: Right;   BREAST EXCISIONAL BIOPSY     Left; cyst   CATARACT EXTRACTION Right    12/2017   CATARACT EXTRACTION W/ INTRAOCULAR LENS IMPLANT Left 09/07/2013   CHOLECYSTECTOMY     COLONOSCOPY      COLONOSCOPY N/A 07/20/2015   Procedure: COLONOSCOPY;  Surgeon: Malissa Hippo, MD;  Location: [facility];  Service: Endoscopy;  Laterality: N/A;  930   EYE SURGERY Left 09/07/2013   cataract    FAMILY HISTORY: Family History  Problem Relation Age of Onset   Lung cancer Mother 39   Kidney disease Father    Diabetes Sister    Keloids Brother    ADD / ADHD Grandchild    Bipolar disorder Grandchild    Bipolar disorder Daughter    Seizures Daughter    Heart disease Daughter    Kidney disease Son    Neuropathy Son    Kidney disease Son    Edema Daughter    Breast cancer Daughter 77   Allergies Daughter    Alcohol abuse Neg Hx    Drug abuse Neg Hx     SOCIAL HISTORY: Social History   Socioeconomic History   Marital status: Married    Spouse name: saunders   Number of children: 5   Years of education: 12   Highest education level: Not on file  Occupational History   Occupation: Disabled     Employer: RETIRED  Tobacco Use   Smoking status: Never    Passive exposure: Past   Smokeless tobacco: Never  Vaping Use   Vaping Use: Never used  Substance and Sexual Activity   Alcohol use: No    Alcohol/week: 0.0 standard drinks of alcohol   Drug use: No   Sexual activity: Yes    Birth control/protection: Surgical  Other Topics Concern   Not on file  Social History Narrative   07/27/20 Patient lives at home with her husband Shara Blazing) and dgtr- Jasmine December.  Patient is retired.    Right handed.    Five Children.    Caffeine- 2 daily   Social Determinants of Health   Financial Resource Strain: Low Risk  (04/18/2022)   Overall Financial Resource Strain (CARDIA)    Difficulty of Paying Living Expenses: Not hard at all  Food Insecurity: No Food Insecurity (12/25/2022)   Hunger Vital Sign    Worried About Running Out  of Food in the Last Year: Never true    Ran Out of Food in the Last Year: Never true  Transportation Needs: No Transportation Needs (12/25/2022)   PRAPARE -  Administrator, Civil Service (Medical): No    Lack of Transportation (Non-Medical): No  Physical Activity: Inactive (04/18/2022)   Exercise Vital Sign    Days of Exercise per Week: 0 days    Minutes of Exercise per Session: 0 min  Stress: No Stress Concern Present (04/18/2022)   [employer] of Occupational Health - Occupational Stress Questionnaire    Feeling of Stress : Not at all  Social Connections: Moderately Integrated (04/18/2022)   Social Connection and Isolation Panel [NHANES]    Frequency of Communication with Friends and Family: More than three times a week    Frequency of Social Gatherings with Friends and Family: More than three times a week    Attends Religious Services: More than 4 times per year    Active Member of [employer] or Organizations: No    Attends Banker Meetings: Never    Marital Status: Married  Catering manager Violence: Not At Risk (12/18/2022)   Humiliation, Afraid, Rape, and Kick questionnaire    Fear of Current or Ex-Partner: No    Emotionally Abused: No    Physically Abused: No    Sexually Abused: No     PHYSICAL EXAM  GENERAL EXAM/CONSTITUTIONAL: Vitals:  Vitals:   01/10/23 1356  BP: (!) 137/54  Pulse: 70  Weight: 227 lb (103 kg)  Height: 5\' 8"  (1.727 m)    Body mass index is 34.52 kg/m. Wt Readings from Last 3 Encounters:  01/10/23 227 lb (103 kg)  12/23/22 253 lb 1.4 oz (114.8 kg)  10/29/22 261 lb 9.6 oz (118.7 kg)   Patient is in no distress; well developed, nourished and groomed; neck is supple  CARDIOVASCULAR: Examination of carotid arteries is normal; no carotid bruits Regular rate and rhythm, no murmurs Examination of peripheral vascular system by observation and palpation is normal  EYES: Ophthalmoscopic exam of optic discs and posterior segments is normal; no papilledema or hemorrhages No results found.  MUSCULOSKELETAL: Gait, strength, tone, movements noted in Neurologic exam  below  NEUROLOGIC: MENTAL STATUS:     01/10/2023    2:04 PM 04/18/2022    3:41 PM 07/27/2020    1:57 PM  MMSE - Mini Mental State Exam  Not completed:  Unable to complete   Orientation to time 4  3  Orientation to Place 5  4  Registration 3  3  Attention/ Calculation 1  2  Recall 0  0  Language- name 2 objects 2  2  Language- repeat 1  0  Language- follow 3 step command 3  3  Language- read & follow direction 1  1  Write a sentence 1  1  Copy design 1  1  Copy design-comments   hand shakes  Total score 22  20   awake, alert, oriented to person DECR MEMORY  DECR attention and concentration language fluent, comprehension intact, naming intact fund of knowledge appropriate  CRANIAL NERVE:  2nd - no papilledema on fundoscopic exam 2nd, 3rd, 4th, 6th - pupils equal and reactive to light, visual fields full to confrontation, extraocular muscles intact, no nystagmus 5th - facial sensation symmetric 7th - facial strength symmetric 8th - hearing intact 9th - palate elevates symmetrically, uvula midline 11th - shoulder shrug symmetric 12th - tongue protrusion midline  MOTOR:  normal bulk and tone, full strength in the BUE, BLE  SENSORY:  normal and symmetric to light touch; DECR IN FEET  COORDINATION:  finger-nose-finger, fine finger movements normal  REFLEXES:  deep tendon reflexes TRACE and symmetric  GAIT/STATION:  IN WHEEL CHAIR     DIAGNOSTIC DATA (LABS, IMAGING, TESTING) - I reviewed patient records, labs, notes, testing and imaging myself where available.  Lab Results  Component Value Date   WBC 8.8 12/27/2022   HGB 9.6 (L) 01/10/2023   HCT 35.8 (L) 12/27/2022   MCV 72.0 (L) 12/27/2022   PLT 355 12/27/2022      Component Value Date/Time   NA 137 12/27/2022 1424   NA 146 (H) 10/24/2021 1218   K 3.1 (L) 12/27/2022 1424   CL 96 (L) 12/27/2022 1424   CO2 32 12/27/2022 1424   GLUCOSE 215 (H) 12/27/2022 1424   BUN 35 (H) 12/27/2022 1424   BUN 42 (H)  10/24/2021 1218   CREATININE 1.74 (H) 12/27/2022 1424   CREATININE 2.02 (H) 03/22/2022 1534   CALCIUM 8.7 (L) 12/27/2022 1424   PROT 7.3 12/18/2022 1244   PROT 7.0 10/24/2021 1218   ALBUMIN 3.4 (L) 12/27/2022 1424   ALBUMIN 4.3 10/24/2021 1218   AST 45 (H) 12/18/2022 1244   ALT 51 (H) 12/18/2022 1244   ALT 16 03/22/2022 1534   ALKPHOS 87 12/18/2022 1244   BILITOT 0.7 12/18/2022 1244   BILITOT 0.2 10/24/2021 1218   GFRNONAA 29 (L) 12/27/2022 1424   GFRNONAA 40 (L) 12/01/2019 1137   GFRAA 55 (L) 06/03/2020 0210   GFRAA 46 (L) 12/01/2019 1137   Lab Results  Component Value Date   CHOL 153 10/24/2021   HDL 60 10/24/2021   LDLCALC 70 10/24/2021   TRIG 136 10/24/2021   CHOLHDL 2.6 10/24/2021   Lab Results  Component Value Date   HGBA1C 6.9 09/13/2022   Lab Results  Component Value Date   VITAMINB12 458 05/27/2020   Lab Results  Component Value Date   TSH 2.750 10/24/2021     05/05/12 EEG - Clinical Interpretation:  This routine EEG done with the patient awake and drowsy is abnormal.  Background activities were slightly too slow suggesting a mild encephalopathy of non-specific etiology.  However, the right hemisphere did seem to be slower than the left suggesting a structural or functional abnormality in that location.   05/29/20 EEG This study is suggestive of mild diffuse encephalopathy, nonspecific etiology. No seizures or epileptiform discharges were seen throughout the recording.  05/27/20 MRI brain  - No acute infarction, hemorrhage, or mass. Moderate chronic microvascular ischemic changes.     ASSESSMENT AND PLAN  82 y.o. year old female here with:  Dx:  1. Moderate dementia with other behavioral disturbance, unspecified dementia type       PLAN:  MODERATE-SEVERE DEMENTIA WITH BEHAVIOR CHANGES (since ~2013) - continue sertraline, buproprion - may add quetiapine 25mg  as needed for agitation / insomnia - safety / supervision issues reviewed - daily  physical activity / exercise (at least 15-30 minutes) - eat more plants / vegetables - increase social activities, brain stimulation, games, puzzles, hobbies, crafts, arts, music - aim for at least 7-8 hours sleep per night (or more) - avoid smoking and alcohol - cannot handle finance, medications, living alone, driving  SEIZURES (going back to 2013; prior seizures related to xanax withdrawal and now UTI / AKI; dementia could be another underlying factor for seizures) - monitor for now; continue DM control; if an  uprovoked seizure occurs in future, then consider anti-seizure medication (such as depakote; prior levetiracetam aggravated mood)  GAIT DIFFICULTY - due to diabetic neuropathy, dementia, deconditioning, obesity - supportive care  Meds ordered this encounter  Medications   QUEtiapine (SEROQUEL) 25 MG tablet    Sig: Take 1 tablet (25 mg total) by mouth at bedtime as needed.    Dispense:  30 tablet    Refill:  12   No follow-ups on file.    Suanne Marker, MD 01/10/2023, 2:28 PM Certified in Neurology, Neurophysiology and Neuroimaging  [facility] [address] [place], [place] [address] [phone]

## 2023-01-10 NOTE — Progress Notes (Signed)
Diagnosis: Anemia in Chronic Kidney Disease  Provider:  Manpreet Bhutani MD  Procedure: Injection  Retacrit (epoetin alfa-epbx), Dose: 10000 Units, Site: subcutaneous, Number of injections: 1  Hgb 9.6   Post Care: Patient declined observation  Discharge: Condition: Good, Destination: Home . AVS Declined  Performed by:  Evelena Peat, RN

## 2023-01-11 ENCOUNTER — Telehealth: Payer: Self-pay

## 2023-01-11 NOTE — Telephone Encounter (Signed)
Per Denzil Magnuson (PT): planned PT 3 times per caregiver requests and has been refused each time. Romeo Apple was requested to show up this Wednesday at 12:30 and showed up at 12:30 then patient refused to be evaluated, no reason given. Romeo Apple stated that he would go back and try again next week.

## 2023-01-15 ENCOUNTER — Ambulatory Visit: Payer: Medicare PPO | Admitting: Nurse Practitioner

## 2023-01-15 DIAGNOSIS — E782 Mixed hyperlipidemia: Secondary | ICD-10-CM

## 2023-01-15 DIAGNOSIS — I10 Essential (primary) hypertension: Secondary | ICD-10-CM

## 2023-01-15 DIAGNOSIS — E1165 Type 2 diabetes mellitus with hyperglycemia: Secondary | ICD-10-CM

## 2023-01-16 ENCOUNTER — Telehealth: Payer: Self-pay | Admitting: *Deleted

## 2023-01-16 NOTE — Telephone Encounter (Signed)
Daughter is calling to update the physician that she has wrapped the area, twice one day ago and once this morning,looks the same. The doesn't  understand why she has to stay wrapped,patient keeps taking everything off and putting on sock, afraid she may get infected, is rolling her foot, stepping on that area,has upcoming appointment on Friday,

## 2023-01-17 DIAGNOSIS — M86171 Other acute osteomyelitis, right ankle and foot: Secondary | ICD-10-CM | POA: Diagnosis not present

## 2023-01-17 DIAGNOSIS — I5033 Acute on chronic diastolic (congestive) heart failure: Secondary | ICD-10-CM | POA: Diagnosis not present

## 2023-01-17 DIAGNOSIS — E1165 Type 2 diabetes mellitus with hyperglycemia: Secondary | ICD-10-CM | POA: Diagnosis not present

## 2023-01-17 DIAGNOSIS — N1832 Chronic kidney disease, stage 3b: Secondary | ICD-10-CM | POA: Diagnosis not present

## 2023-01-17 DIAGNOSIS — E1169 Type 2 diabetes mellitus with other specified complication: Secondary | ICD-10-CM | POA: Diagnosis not present

## 2023-01-17 DIAGNOSIS — E1122 Type 2 diabetes mellitus with diabetic chronic kidney disease: Secondary | ICD-10-CM | POA: Diagnosis not present

## 2023-01-17 DIAGNOSIS — I130 Hypertensive heart and chronic kidney disease with heart failure and stage 1 through stage 4 chronic kidney disease, or unspecified chronic kidney disease: Secondary | ICD-10-CM | POA: Diagnosis not present

## 2023-01-17 DIAGNOSIS — F0394 Unspecified dementia, unspecified severity, with anxiety: Secondary | ICD-10-CM | POA: Diagnosis not present

## 2023-01-17 DIAGNOSIS — Z4781 Encounter for orthopedic aftercare following surgical amputation: Secondary | ICD-10-CM | POA: Diagnosis not present

## 2023-01-18 ENCOUNTER — Ambulatory Visit: Payer: Medicare PPO | Admitting: Podiatry

## 2023-01-18 ENCOUNTER — Telehealth: Payer: Self-pay | Admitting: Family Medicine

## 2023-01-18 ENCOUNTER — Encounter: Payer: Self-pay | Admitting: Podiatry

## 2023-01-18 DIAGNOSIS — Z9889 Other specified postprocedural states: Secondary | ICD-10-CM

## 2023-01-18 NOTE — Progress Notes (Signed)
Subjective:  Patient ID: Laura Atkins, female    DOB: 11/27/1940,  MRN: [id number]  No chief complaint on file.   DOS: 12/20/22  Procedure: Right hallux partial amputation   82 y.o. female returns for POV#2. Relates doing well with minimal pain.   Review of Systems: Negative except as noted in the HPI. Denies N/V/F/Ch.  Past Medical History:  Diagnosis Date   Alpha thalassemia trait 01/26/2010   02/2012: Nl CBC ex H&H-10.7/34.8, MCV-69    Anemia    Anxiety    Anxiety and depression    Cellulitis 05/09/2017   Depression    Diabetes mellitus    Foot pain, right 04/30/2013   GERD (gastroesophageal reflux disease)    Headache(784.0)    Hyperlipidemia    Hypertension    Iron deficiency 01/21/2017   Microcytic anemia 01/26/2010   02/2012: Nl CBC ex H&H-10.7/34.8, MCV-69    NECK PAIN, CHRONIC 10/21/2008   +chronic back pain     Obesity    Obstructive sleep apnea    Osteoarthritis    Left knee; right shoulder; chronic neck and back pain   Pruritus    PVD (peripheral vascular disease) (HCC) 01/28/2014   Seizures (HCC)    Shoulder pain, right 04/14/2015   Tremor    This started months ago after her seizure progressing to very poor hand writing   Urinary incontinence    UTI (urinary tract infection) 01/18/2013    Current Outpatient Medications:    Accu-Chek FastClix Lancets MISC, 1 each by Does not apply route 4 (four) times daily. Use to monitor glucose levels 4 times daily; E11.65, Disp: 306 each, Rfl: 2   Alcohol Swabs (B-D SINGLE USE SWABS REGULAR) PADS, 1 each by Does not apply route 4 (four) times daily. Use to prep site for glucose monitoring 4 times daily; E11.65, Disp: 300 each, Rfl: 2   amLODipine (NORVASC) 5 MG tablet, Take 1.5 tablets (7.5 mg total) by mouth daily., Disp: 135 tablet, Rfl: 1   aspirin 81 MG tablet, Take 81 mg by mouth daily. (Patient not taking: Reported on 12/19/2022), Disp: , Rfl:    atorvastatin (LIPITOR) 40 MG tablet, TAKE 1 TABLET BY MOUTH  EVERY DAY (Patient taking differently: Take 40 mg by mouth daily.), Disp: 30 tablet, Rfl: 2   benazepril (LOTENSIN) 20 MG tablet, TAKE 1 TABLET BY MOUTH EVERY DAY (Patient taking differently: Take 20 mg by mouth daily.), Disp: 30 tablet, Rfl: 5   Blood Glucose Monitoring Suppl (ACCU-CHEK GUIDE ME) w/Device KIT, 1 each by Does not apply route 4 (four) times daily. Use to monitor glucose levels 4 times daily; E11.65, Disp: 1 kit, Rfl: 0   buPROPion (WELLBUTRIN XL) 150 MG 24 hr tablet, TAKE 1 TABLET BY MOUTH EVERY MORNING, Disp: 90 tablet, Rfl: 2   Cholecalciferol 125 MCG (5000 UT) TABS, Take 5,000 Units by mouth daily., Disp: , Rfl:    Cyanocobalamin (VITAMIN B-12) 5000 MCG TBDP, Take 5,000 mcg by mouth daily., Disp: , Rfl:    diphenhydrAMINE (BENADRYL) 25 MG tablet, Take 25 mg by mouth at bedtime., Disp: , Rfl:    donepezil (ARICEPT) 10 MG tablet, Take 1 tablet (10 mg total) by mouth at bedtime., Disp: 30 tablet, Rfl: 3   gabapentin (NEURONTIN) 400 MG capsule, TAKE ONE CAPSULE BY MOUTH EVERY MORNING and TAKE ONE CAPSULE AT NOON and TAKE TWO CAPSULES AT BEDTIME (Patient taking differently: Take 400-800 mg by mouth See admin instructions. TAKE ONE CAPSULE BY MOUTH EVERY MORNING and TAKE  ONE CAPSULE AT NOON and TAKE TWO CAPSULES AT BEDTIME), Disp: 360 capsule, Rfl: 1   glucose blood (ACCU-CHEK GUIDE) test strip, USE TO CHECK BLOOD SUGAR FOUR TIMES DAILY, Disp: 200 strip, Rfl: 1   glucose blood test strip, 1 each by Other route in the morning, at noon, in the evening, and at bedtime. Use as instructed qid. E11.65, Disp: , Rfl:    Incontinence Supply Disposable (TRANQUILITY UNDERGARMENTS) MISC, 1 each by Does not apply route daily., Disp: 90 each, Rfl: 3   insulin degludec (TRESIBA FLEXTOUCH) 200 UNIT/ML FlexTouch Pen, Inject 12 Units into the skin at bedtime., Disp: 12 mL, Rfl: 3   Insulin Pen Needle (EASY TOUCH PEN NEEDLES) 31G X 5 MM MISC, USE TO INJECT INSULIN ONCE DAILY, Disp: 100 each, Rfl: 2   Insulin  Syringe-Needle U-100 (INSULIN SYRINGE 1CC/31GX5/16") 31G X 5/16" 1 ML MISC, 1 each by Does not apply route daily. Use to inject insulin daily; E11.65, Disp: 90 each, Rfl: 2   Magnesium 250 MG TABS, Take 500 mg by mouth daily., Disp: , Rfl:    Multiple Vitamin (MULTIVITAMIN) capsule, Take 1 capsule by mouth daily., Disp: , Rfl:    NONFORMULARY OR COMPOUNDED ITEM, Apothecary hemorrhoid cream. Apply rectally up to qid as directed., Disp: , Rfl:    omeprazole (PRILOSEC) 20 MG capsule, TAKE ONE CAPSULE BY MOUTH EVERY DAY (Patient taking differently: Take 20 mg by mouth daily.), Disp: 30 capsule, Rfl: 2   QUEtiapine (SEROQUEL) 25 MG tablet, Take 1 tablet (25 mg total) by mouth at bedtime as needed., Disp: 30 tablet, Rfl: 12   sertraline (ZOLOFT) 100 MG tablet, TAKE 1 TABLET BY MOUTH DAILY, Disp: 90 tablet, Rfl: 2   torsemide (DEMADEX) 20 MG tablet, TAKE 2 TABLETS BY MOUTH EVERY DAY and additionally TAKE 1 TABLET EVERY OTHER DAY (Patient taking differently: Take 40 mg by mouth 2 (two) times daily.), Disp: 225 tablet, Rfl: 2   UNABLE TO FIND, Walker x 1  DX unsteady gait, osteoarthritis of left knee, Disp: 1 each, Rfl: 0   UNABLE TO FIND, Elevated Toliet Seat x 1 DX: unsteady gait, back pain, osteoarthritis of left knee, Disp: 1 each, Rfl: 0   UNABLE TO FIND, Standing upright walker x 1  DX M54.40, M19.90, Disp: 1 each, Rfl: 0   UNABLE TO FIND, Incontinence pads and supplies, Disp: 1 each, Rfl: 1   UNABLE TO FIND, Rollator Walker, Disp: 1 Product, Rfl: 0   UNABLE TO FIND, XL Tranquility Briefs for incontinence., Disp: 1 Package, Rfl: 3   UNABLE TO FIND, Premium overnight disposible absorbant underwear XL UPC- [id number], Disp: 14 each, Rfl: 11  Social History   Tobacco Use  Smoking Status Never   Passive exposure: Past  Smokeless Tobacco Never    Allergies  Allergen Reactions   Penicillins Shortness Of Breath, Itching and Rash   Prednisone Shortness Of Breath, Itching and Rash   Propoxyphene  N-Acetaminophen Itching and Nausea And Vomiting    Darvocet    Gentamicin Rash    Cream   Sulfa Antibiotics Itching and Nausea And Vomiting   Objective:  There were no vitals filed for this visit. There is no height or weight on file to calculate BMI. Constitutional Well developed. Well nourished.  Vascular Foot warm and well perfused. Capillary refill normal to all digits.   Neurologic Normal speech. Oriented to person, place, and time. Epicritic sensation to light touch grossly present bilaterally.  Dermatologic Skin healing well without signs of  infection. Skin edges well coapted without signs of infection.  Orthopedic: Tenderness to palpation noted about the surgical site.   Radiographs: Interval amputation of right hallux at level of IPJ.  Assessment:   No diagnosis found.  Plan:  Patient was evaluated and treated and all questions answered.  S/p foot surgery right -Progressing as expected post-operatively. -WB Status: May begin transition to [facility] in regular shoes.  -Sutures: removed today without inicdnet.  May start showering tomorrow but no foot soaking for week.  -Medications: n/a -Foot redressed.  Follow-up 3 weeks for post-op check.   No follow-ups on file.

## 2023-01-18 NOTE — Telephone Encounter (Signed)
Verbal order given  

## 2023-01-18 NOTE — Telephone Encounter (Signed)
Huntley Dec called from [facility] needs order to extend once week for 2 more weeks to continue care of amputation of toe. Call back # [phone].

## 2023-01-23 ENCOUNTER — Telehealth: Payer: Self-pay | Admitting: Neurology

## 2023-01-23 DIAGNOSIS — Z4781 Encounter for orthopedic aftercare following surgical amputation: Secondary | ICD-10-CM | POA: Diagnosis not present

## 2023-01-23 DIAGNOSIS — I5033 Acute on chronic diastolic (congestive) heart failure: Secondary | ICD-10-CM | POA: Diagnosis not present

## 2023-01-23 DIAGNOSIS — F0394 Unspecified dementia, unspecified severity, with anxiety: Secondary | ICD-10-CM | POA: Diagnosis not present

## 2023-01-23 DIAGNOSIS — E1165 Type 2 diabetes mellitus with hyperglycemia: Secondary | ICD-10-CM | POA: Diagnosis not present

## 2023-01-23 DIAGNOSIS — I130 Hypertensive heart and chronic kidney disease with heart failure and stage 1 through stage 4 chronic kidney disease, or unspecified chronic kidney disease: Secondary | ICD-10-CM | POA: Diagnosis not present

## 2023-01-23 DIAGNOSIS — E1169 Type 2 diabetes mellitus with other specified complication: Secondary | ICD-10-CM | POA: Diagnosis not present

## 2023-01-23 DIAGNOSIS — N1832 Chronic kidney disease, stage 3b: Secondary | ICD-10-CM | POA: Diagnosis not present

## 2023-01-23 DIAGNOSIS — M86171 Other acute osteomyelitis, right ankle and foot: Secondary | ICD-10-CM | POA: Diagnosis not present

## 2023-01-23 DIAGNOSIS — E1122 Type 2 diabetes mellitus with diabetic chronic kidney disease: Secondary | ICD-10-CM | POA: Diagnosis not present

## 2023-01-23 NOTE — Telephone Encounter (Signed)
PA completed on [employer]MM[employer] [id number] Will await determination

## 2023-01-23 NOTE — Telephone Encounter (Signed)
Approval received via fax, good until 09/24/2023. Pharmacy notified.

## 2023-01-24 ENCOUNTER — Encounter (HOSPITAL_COMMUNITY)
Admission: RE | Admit: 2023-01-24 | Discharge: 2023-01-24 | Disposition: A | Discharge status: Home / Self Care | Payer: Medicare PPO | Source: Ambulatory Visit | Attending: Nephrology | Admitting: Nephrology

## 2023-01-24 VITALS — BP 133/68 | HR 68 | Temp 98.3°F | Resp 16

## 2023-01-24 DIAGNOSIS — N183 Chronic kidney disease, stage 3 unspecified: Secondary | ICD-10-CM | POA: Diagnosis not present

## 2023-01-24 DIAGNOSIS — D631 Anemia in chronic kidney disease: Secondary | ICD-10-CM | POA: Diagnosis not present

## 2023-01-24 LAB — POCT HEMOGLOBIN-HEMACUE: Hemoglobin: 8.6 g/dL — ABNORMAL LOW (ref 12.0–15.0)

## 2023-01-24 MED ORDER — EPOETIN ALFA-EPBX 10000 UNIT/ML IJ SOLN
10000.0000 [IU] | Freq: Once | INTRAMUSCULAR | Status: AC
  Administered 2023-01-24: 10000 [IU] via SUBCUTANEOUS

## 2023-01-24 NOTE — Progress Notes (Signed)
Diagnosis: Anemia in Chronic Kidney Disease  Provider:  Manpreet Bhutani MD  Procedure: Injection  Retacrit (epoetin alfa-epbx), Dose: 10000 Units, Site: subcutaneous, Number of injections: 1  Hgb 8.6   Post Care: Patient declined observation  Discharge: Condition: Good, Destination: Home . AVS provided.  Performed by:  Wyvonne Lenz, RN

## 2023-01-24 NOTE — Addendum Note (Signed)
Encounter addended by: Wyvonne Lenz, RN on: 01/24/2023 1:29 PM  Actions taken: Therapy plan modified

## 2023-01-25 ENCOUNTER — Other Ambulatory Visit: Payer: Self-pay | Admitting: Family Medicine

## 2023-01-31 DIAGNOSIS — I5033 Acute on chronic diastolic (congestive) heart failure: Secondary | ICD-10-CM | POA: Diagnosis not present

## 2023-01-31 DIAGNOSIS — I130 Hypertensive heart and chronic kidney disease with heart failure and stage 1 through stage 4 chronic kidney disease, or unspecified chronic kidney disease: Secondary | ICD-10-CM | POA: Diagnosis not present

## 2023-01-31 DIAGNOSIS — Z4781 Encounter for orthopedic aftercare following surgical amputation: Secondary | ICD-10-CM | POA: Diagnosis not present

## 2023-01-31 DIAGNOSIS — E1169 Type 2 diabetes mellitus with other specified complication: Secondary | ICD-10-CM | POA: Diagnosis not present

## 2023-01-31 DIAGNOSIS — M86171 Other acute osteomyelitis, right ankle and foot: Secondary | ICD-10-CM | POA: Diagnosis not present

## 2023-01-31 DIAGNOSIS — F0394 Unspecified dementia, unspecified severity, with anxiety: Secondary | ICD-10-CM | POA: Diagnosis not present

## 2023-01-31 DIAGNOSIS — N1832 Chronic kidney disease, stage 3b: Secondary | ICD-10-CM | POA: Diagnosis not present

## 2023-01-31 DIAGNOSIS — E1165 Type 2 diabetes mellitus with hyperglycemia: Secondary | ICD-10-CM | POA: Diagnosis not present

## 2023-01-31 DIAGNOSIS — E1122 Type 2 diabetes mellitus with diabetic chronic kidney disease: Secondary | ICD-10-CM | POA: Diagnosis not present

## 2023-02-04 ENCOUNTER — Other Ambulatory Visit (INDEPENDENT_AMBULATORY_CARE_PROVIDER_SITE_OTHER): Payer: Self-pay | Admitting: Gastroenterology

## 2023-02-04 ENCOUNTER — Telehealth (INDEPENDENT_AMBULATORY_CARE_PROVIDER_SITE_OTHER): Payer: Self-pay | Admitting: *Deleted

## 2023-02-04 MED ORDER — NONFORMULARY OR COMPOUNDED ITEM: 1.0000 | Freq: Four times a day (QID) | 0 refills | Status: DC | PRN

## 2023-02-04 NOTE — Telephone Encounter (Signed)
Patient's daughter sharon walked in office asking for a refill on hemorrhoid cream that [facility] cream. Last seen 10/29/22. She is aware that it will probably be tomorrow due to having to get provider approval and then it will need to be called in and she is fine with that.   Jasmine December [phone]

## 2023-02-04 NOTE — Telephone Encounter (Signed)
I printed the [facility] cream prescription, please mail a prescription to her home.

## 2023-02-05 NOTE — Telephone Encounter (Signed)
Pt's daughter notified med sent in

## 2023-02-05 NOTE — Telephone Encounter (Signed)
I called and left a message for the patient daughter Jasmine December, asked that she please return call to the office.   I have called the cream into [facility] spoke with [place].

## 2023-02-07 ENCOUNTER — Encounter (HOSPITAL_COMMUNITY)
Admission: RE | Admit: 2023-02-07 | Discharge: 2023-02-07 | Disposition: A | Discharge status: Home / Self Care | Payer: Medicare PPO | Source: Ambulatory Visit | Attending: Nephrology | Admitting: Nephrology

## 2023-02-07 VITALS — BP 107/56 | HR 71 | Temp 97.5°F | Resp 18

## 2023-02-07 DIAGNOSIS — D631 Anemia in chronic kidney disease: Secondary | ICD-10-CM | POA: Diagnosis not present

## 2023-02-07 DIAGNOSIS — N183 Chronic kidney disease, stage 3 unspecified: Secondary | ICD-10-CM | POA: Diagnosis not present

## 2023-02-07 LAB — CBC WITH DIFFERENTIAL/PLATELET
Abs Immature Granulocytes: 0.01 10*3/uL (ref 0.00–0.07)
Basophils Absolute: 0 10*3/uL (ref 0.0–0.1)
Basophils Relative: 1 %
Eosinophils Absolute: 0.4 10*3/uL (ref 0.0–0.5)
Eosinophils Relative: 8 %
HCT: 31.2 % — ABNORMAL LOW (ref 36.0–46.0)
Hemoglobin: 9.1 g/dL — ABNORMAL LOW (ref 12.0–15.0)
Immature Granulocytes: 0 %
Lymphocytes Relative: 38 %
Lymphs Abs: 2 10*3/uL (ref 0.7–4.0)
MCH: 20.2 pg — ABNORMAL LOW (ref 26.0–34.0)
MCHC: 29.2 g/dL — ABNORMAL LOW (ref 30.0–36.0)
MCV: 69.3 fL — ABNORMAL LOW (ref 80.0–100.0)
Monocytes Absolute: 0.3 10*3/uL (ref 0.1–1.0)
Monocytes Relative: 6 %
Neutro Abs: 2.6 10*3/uL (ref 1.7–7.7)
Neutrophils Relative %: 47 %
Platelets: 235 10*3/uL (ref 150–400)
RBC: 4.5 MIL/uL (ref 3.87–5.11)
RDW: 20 % — ABNORMAL HIGH (ref 11.5–15.5)
WBC: 5.4 10*3/uL (ref 4.0–10.5)
nRBC: 0 % (ref 0.0–0.2)

## 2023-02-07 LAB — RENAL FUNCTION PANEL
Albumin: 3.6 g/dL (ref 3.5–5.0)
Anion gap: 10 (ref 5–15)
BUN: 33 mg/dL — ABNORMAL HIGH (ref 8–23)
CO2: 30 mmol/L (ref 22–32)
Calcium: 9 mg/dL (ref 8.9–10.3)
Chloride: 97 mmol/L — ABNORMAL LOW (ref 98–111)
Creatinine, Ser: 1.42 mg/dL — ABNORMAL HIGH (ref 0.44–1.00)
GFR, Estimated: 38 mL/min — ABNORMAL LOW (ref >60)
Glucose, Bld: 123 mg/dL — ABNORMAL HIGH (ref 70–99)
Phosphorus: 4.2 mg/dL (ref 2.5–4.6)
Potassium: 4 mmol/L (ref 3.5–5.1)
Sodium: 137 mmol/L (ref 135–145)

## 2023-02-07 LAB — POCT HEMOGLOBIN-HEMACUE: Hemoglobin: 9.3 g/dL — ABNORMAL LOW (ref 12.0–15.0)

## 2023-02-07 MED ORDER — EPOETIN ALFA-EPBX 10000 UNIT/ML IJ SOLN
10000.0000 [IU] | Freq: Once | INTRAMUSCULAR | Status: AC
  Administered 2023-02-07: 10000 [IU] via SUBCUTANEOUS

## 2023-02-07 NOTE — Addendum Note (Signed)
Encounter addended by: Wyvonne Lenz, RN on: 02/07/2023 3:11 PM  Actions taken: Therapy plan modified

## 2023-02-07 NOTE — Progress Notes (Signed)
Diagnosis: Anemia in Chronic Kidney Disease  Provider:  Manpreet Bhutani MD  Procedure: Injection  Retacrit (epoetin alfa-epbx), Dose: 10000 Units, Site: subcutaneous, Number of injections: 1  Hgb 9.3   Post Care: Patient declined observation  Discharge: Condition: Good, Destination: Home . AVS provided.  Performed by:  Wyvonne Lenz, RN

## 2023-02-08 ENCOUNTER — Ambulatory Visit: Payer: Medicare PPO | Admitting: Podiatry

## 2023-02-21 DIAGNOSIS — D638 Anemia in other chronic diseases classified elsewhere: Secondary | ICD-10-CM | POA: Diagnosis not present

## 2023-02-21 DIAGNOSIS — E1122 Type 2 diabetes mellitus with diabetic chronic kidney disease: Secondary | ICD-10-CM | POA: Diagnosis not present

## 2023-02-21 DIAGNOSIS — D560 Alpha thalassemia: Secondary | ICD-10-CM | POA: Diagnosis not present

## 2023-02-21 DIAGNOSIS — D509 Iron deficiency anemia, unspecified: Secondary | ICD-10-CM | POA: Diagnosis not present

## 2023-02-22 ENCOUNTER — Other Ambulatory Visit: Payer: Self-pay

## 2023-02-22 ENCOUNTER — Encounter (HOSPITAL_COMMUNITY)
Admission: RE | Admit: 2023-02-22 | Discharge: 2023-02-22 | Disposition: A | Discharge status: Home / Self Care | Payer: Medicare PPO | Source: Ambulatory Visit | Attending: Nephrology | Admitting: Nephrology

## 2023-02-22 ENCOUNTER — Ambulatory Visit: Payer: Medicare PPO | Admitting: Podiatry

## 2023-02-22 VITALS — BP 119/52 | HR 65 | Temp 98.0°F | Resp 18

## 2023-02-22 DIAGNOSIS — D631 Anemia in chronic kidney disease: Secondary | ICD-10-CM | POA: Diagnosis not present

## 2023-02-22 DIAGNOSIS — N183 Chronic kidney disease, stage 3 unspecified: Secondary | ICD-10-CM | POA: Diagnosis not present

## 2023-02-22 LAB — PROTEIN / CREATININE RATIO, URINE
Creatinine, Urine: 73 mg/dL
Protein Creatinine Ratio: 0.15 mg/mg{Cre} (ref 0.00–0.15)
Total Protein, Urine: 11 mg/dL

## 2023-02-22 MED ORDER — EPOETIN ALFA-EPBX 10000 UNIT/ML IJ SOLN
10000.0000 [IU] | Freq: Once | INTRAMUSCULAR | Status: AC
  Administered 2023-02-22: 10000 [IU] via SUBCUTANEOUS
  Filled 2023-02-22: qty 1

## 2023-02-22 NOTE — Progress Notes (Signed)
Diagnosis: Anemia in Chronic Kidney Disease  Provider:  Manpreet Bhutani MD  Procedure: Injection  Retacrit (epoetin alfa-epbx), Dose: 10000 Units, Site: subcutaneous, Number of injections: 1  Hgb 9.6   Post Care: Patient declined observation  Discharge: Condition: Good, Destination: Home . AVS provided.  Performed by:  Wyvonne Lenz, RN

## 2023-02-25 ENCOUNTER — Other Ambulatory Visit: Payer: Self-pay | Admitting: Pharmacy Technician

## 2023-02-25 ENCOUNTER — Telehealth: Payer: Self-pay | Admitting: Pharmacy Technician

## 2023-02-25 NOTE — Telephone Encounter (Signed)
Verbal order: Laura Atkins Phone: [phone] Venofer 200mg  IV x 5 doses. (D/C - Feraheme)  Auth Submission: NO AUTH NEEDED Site of care: Site of care: [facility] Payer: [employer] MEDICARE Medication & CPT/J Code(s) submitted: Venofer (Iron Sucrose) J1756 Route of submission (phone, fax, portal):  Phone # Fax # Auth type: Buy/Bill Units/visits requested: 200mg  IV X5 DOSES Reference number:  Approval from: 02/25/23 to 06/27/23

## 2023-02-26 ENCOUNTER — Other Ambulatory Visit: Payer: Self-pay | Admitting: Nurse Practitioner

## 2023-02-26 DIAGNOSIS — E1165 Type 2 diabetes mellitus with hyperglycemia: Secondary | ICD-10-CM

## 2023-02-26 LAB — POCT HEMOGLOBIN-HEMACUE: Hemoglobin: 9.6 g/dL — ABNORMAL LOW (ref 12.0–15.0)

## 2023-02-27 ENCOUNTER — Ambulatory Visit: Payer: Medicare PPO | Admitting: Cardiology

## 2023-02-28 ENCOUNTER — Encounter (HOSPITAL_COMMUNITY)
Admission: RE | Admit: 2023-02-28 | Discharge: 2023-02-28 | Disposition: A | Discharge status: Home / Self Care | Payer: Medicare PPO | Source: Ambulatory Visit | Attending: Nephrology | Admitting: Nephrology

## 2023-02-28 VITALS — BP 122/81 | HR 68 | Temp 97.5°F | Resp 20

## 2023-02-28 DIAGNOSIS — D631 Anemia in chronic kidney disease: Secondary | ICD-10-CM | POA: Insufficient documentation

## 2023-02-28 DIAGNOSIS — N183 Chronic kidney disease, stage 3 unspecified: Secondary | ICD-10-CM | POA: Insufficient documentation

## 2023-02-28 DIAGNOSIS — D500 Iron deficiency anemia secondary to blood loss (chronic): Secondary | ICD-10-CM | POA: Insufficient documentation

## 2023-02-28 MED ORDER — DIPHENHYDRAMINE HCL 25 MG PO CAPS
25.0000 mg | ORAL_CAPSULE | Freq: Once | ORAL | Status: AC
  Administered 2023-02-28: 25 mg via ORAL
  Filled 2023-02-28: qty 1

## 2023-02-28 MED ORDER — FAMOTIDINE IN NACL 20-0.9 MG/50ML-% IV SOLN: 20.0000 mg | Freq: Once | INTRAVENOUS | Status: DC | PRN

## 2023-02-28 MED ORDER — ALBUTEROL SULFATE HFA 108 (90 BASE) MCG/ACT IN AERS: 2.0000 | INHALATION_SPRAY | Freq: Once | RESPIRATORY_TRACT | Status: DC | PRN

## 2023-02-28 MED ORDER — SODIUM CHLORIDE 0.9 % IV SOLN: Freq: Once | INTRAVENOUS | Status: DC | PRN

## 2023-02-28 MED ORDER — ACETAMINOPHEN 325 MG PO TABS
650.0000 mg | ORAL_TABLET | Freq: Once | ORAL | Status: AC
  Administered 2023-02-28: 650 mg via ORAL
  Filled 2023-02-28: qty 2

## 2023-02-28 MED ORDER — DIPHENHYDRAMINE HCL 50 MG/ML IJ SOLN: 50.0000 mg | Freq: Once | INTRAMUSCULAR | Status: DC | PRN

## 2023-02-28 MED ORDER — METHYLPREDNISOLONE SODIUM SUCC 125 MG IJ SOLR: 125.0000 mg | Freq: Once | INTRAMUSCULAR | Status: DC | PRN

## 2023-02-28 MED ORDER — SODIUM CHLORIDE 0.9 % IV SOLN
200.0000 mg | Freq: Once | INTRAVENOUS | Status: AC
  Administered 2023-02-28: 200 mg via INTRAVENOUS
  Filled 2023-02-28: qty 200

## 2023-02-28 MED ORDER — EPINEPHRINE 0.3 MG/0.3ML IJ SOAJ: 0.3000 mg | Freq: Once | INTRAMUSCULAR | Status: DC | PRN

## 2023-02-28 NOTE — Progress Notes (Signed)
Diagnosis: Anemia in Chronic Kidney Disease  Provider:  Celso Amy MD  Procedure: IV Infusion  IV Type: Peripheral, IV Location: L Forearm  Venofer (Iron Sucrose), Dose: 200 mg  Infusion Start Time: 1435  Infusion Stop Time: 1454  Post Infusion IV Care: Observation period completed  Discharge: Condition: Good, Destination: Home . AVS Provided  Performed by:  Jasper Riling, RN

## 2023-02-28 NOTE — Addendum Note (Signed)
Encounter addended by: Wyvonne Lenz, RN on: 02/28/2023 4:31 PM  Actions taken: Therapy plan modified

## 2023-03-01 ENCOUNTER — Ambulatory Visit (INDEPENDENT_AMBULATORY_CARE_PROVIDER_SITE_OTHER): Payer: Medicare PPO | Admitting: Podiatry

## 2023-03-01 DIAGNOSIS — Z9889 Other specified postprocedural states: Secondary | ICD-10-CM

## 2023-03-01 NOTE — Progress Notes (Signed)
Subjective:  Patient ID: Laura Atkins, female    DOB: May 26, 1941,  MRN: [id number]  No chief complaint on file.   DOS: 12/20/22  Procedure: Right hallux partial amputation   82 y.o. female returns for POV#3. Relates doing well with minimal pain.   Review of Systems: Negative except as noted in the HPI. Denies N/V/F/Ch.  Past Medical History:  Diagnosis Date   Alpha thalassemia trait 01/26/2010   02/2012: Nl CBC ex H&H-10.7/34.8, MCV-69    Anemia    Anxiety    Anxiety and depression    Cellulitis 05/09/2017   Depression    Diabetes mellitus    Foot pain, right 04/30/2013   GERD (gastroesophageal reflux disease)    Headache(784.0)    Hyperlipidemia    Hypertension    Iron deficiency 01/21/2017   Microcytic anemia 01/26/2010   02/2012: Nl CBC ex H&H-10.7/34.8, MCV-69    NECK PAIN, CHRONIC 10/21/2008   +chronic back pain     Obesity    Obstructive sleep apnea    Osteoarthritis    Left knee; right shoulder; chronic neck and back pain   Pruritus    PVD (peripheral vascular disease) (HCC) 01/28/2014   Seizures (HCC)    Shoulder pain, right 04/14/2015   Tremor    This started months ago after her seizure progressing to very poor hand writing   Urinary incontinence    UTI (urinary tract infection) 01/18/2013    Current Outpatient Medications:    Accu-Chek FastClix Lancets MISC, 1 each by Does not apply route 4 (four) times daily. Use to monitor glucose levels 4 times daily; E11.65, Disp: 306 each, Rfl: 2   ACCU-CHEK GUIDE test strip, USE TO CHECK BLOOD SUGAR FOUR TIMES DAILY, Disp: 200 strip, Rfl: 1   Alcohol Swabs (B-D SINGLE USE SWABS REGULAR) PADS, 1 each by Does not apply route 4 (four) times daily. Use to prep site for glucose monitoring 4 times daily; E11.65, Disp: 300 each, Rfl: 2   amLODipine (NORVASC) 5 MG tablet, Take 1.5 tablets (7.5 mg total) by mouth daily., Disp: 135 tablet, Rfl: 1   aspirin 81 MG tablet, Take 81 mg by mouth daily. (Patient not taking:  Reported on 12/19/2022), Disp: , Rfl:    atorvastatin (LIPITOR) 40 MG tablet, TAKE 1 TABLET BY MOUTH EVERY DAY, Disp: 30 tablet, Rfl: 2   benazepril (LOTENSIN) 20 MG tablet, TAKE 1 TABLET BY MOUTH EVERY DAY (Patient taking differently: Take 20 mg by mouth daily.), Disp: 30 tablet, Rfl: 5   Blood Glucose Monitoring Suppl (ACCU-CHEK GUIDE ME) w/Device KIT, 1 each by Does not apply route 4 (four) times daily. Use to monitor glucose levels 4 times daily; E11.65, Disp: 1 kit, Rfl: 0   buPROPion (WELLBUTRIN XL) 150 MG 24 hr tablet, TAKE 1 TABLET BY MOUTH EVERY MORNING, Disp: 90 tablet, Rfl: 2   Cholecalciferol 125 MCG (5000 UT) TABS, Take 5,000 Units by mouth daily., Disp: , Rfl:    Cyanocobalamin (VITAMIN B-12) 5000 MCG TBDP, Take 5,000 mcg by mouth daily., Disp: , Rfl:    diphenhydrAMINE (BENADRYL) 25 MG tablet, Take 25 mg by mouth at bedtime., Disp: , Rfl:    donepezil (ARICEPT) 10 MG tablet, Take 1 tablet (10 mg total) by mouth at bedtime., Disp: 30 tablet, Rfl: 3   gabapentin (NEURONTIN) 400 MG capsule, TAKE ONE CAPSULE BY MOUTH EVERY MORNING and TAKE ONE CAPSULE AT NOON and TAKE TWO CAPSULES AT BEDTIME (Patient taking differently: Take 400-800 mg by mouth See admin  instructions. TAKE ONE CAPSULE BY MOUTH EVERY MORNING and TAKE ONE CAPSULE AT NOON and TAKE TWO CAPSULES AT BEDTIME), Disp: 360 capsule, Rfl: 1   glucose blood test strip, 1 each by Other route in the morning, at noon, in the evening, and at bedtime. Use as instructed qid. E11.65, Disp: , Rfl:    Incontinence Supply Disposable (TRANQUILITY UNDERGARMENTS) MISC, 1 each by Does not apply route daily., Disp: 90 each, Rfl: 3   insulin degludec (TRESIBA FLEXTOUCH) 200 UNIT/ML FlexTouch Pen, Inject 12 Units into the skin at bedtime., Disp: 12 mL, Rfl: 3   Insulin Pen Needle (EASY TOUCH PEN NEEDLES) 31G X 5 MM MISC, USE TO INJECT INSULIN ONCE DAILY, Disp: 100 each, Rfl: 2   Insulin Syringe-Needle U-100 (INSULIN SYRINGE 1CC/31GX5/16") 31G X 5/16" 1 ML  MISC, 1 each by Does not apply route daily. Use to inject insulin daily; E11.65, Disp: 90 each, Rfl: 2   Magnesium 250 MG TABS, Take 500 mg by mouth daily., Disp: , Rfl:    Multiple Vitamin (MULTIVITAMIN) capsule, Take 1 capsule by mouth daily., Disp: , Rfl:    NONFORMULARY OR COMPOUNDED ITEM, Apply 1 each topically every 6 (six) hours as needed (perianal pain). Apothecary hemorrhoid cream. Apply rectally up to qid as directed., Disp: 1 each, Rfl: 0   omeprazole (PRILOSEC) 20 MG capsule, TAKE ONE CAPSULE BY MOUTH EVERY DAY, Disp: 30 capsule, Rfl: 2   QUEtiapine (SEROQUEL) 25 MG tablet, Take 1 tablet (25 mg total) by mouth at bedtime as needed., Disp: 30 tablet, Rfl: 12   sertraline (ZOLOFT) 100 MG tablet, TAKE 1 TABLET BY MOUTH DAILY, Disp: 90 tablet, Rfl: 2   torsemide (DEMADEX) 20 MG tablet, TAKE 2 TABLETS BY MOUTH EVERY DAY and additionally TAKE 1 TABLET EVERY OTHER DAY (Patient taking differently: Take 40 mg by mouth 2 (two) times daily.), Disp: 225 tablet, Rfl: 2   UNABLE TO FIND, Walker x 1  DX unsteady gait, osteoarthritis of left knee, Disp: 1 each, Rfl: 0   UNABLE TO FIND, Elevated Toliet Seat x 1 DX: unsteady gait, back pain, osteoarthritis of left knee, Disp: 1 each, Rfl: 0   UNABLE TO FIND, Standing upright walker x 1  DX M54.40, M19.90, Disp: 1 each, Rfl: 0   UNABLE TO FIND, Incontinence pads and supplies, Disp: 1 each, Rfl: 1   UNABLE TO FIND, Rollator Walker, Disp: 1 Product, Rfl: 0   UNABLE TO FIND, XL Tranquility Briefs for incontinence., Disp: 1 Package, Rfl: 3   UNABLE TO FIND, Premium overnight disposible absorbant underwear XL UPC- [id number], Disp: 14 each, Rfl: 11  Social History   Tobacco Use  Smoking Status Never   Passive exposure: Past  Smokeless Tobacco Never    Allergies  Allergen Reactions   Penicillins Shortness Of Breath, Itching and Rash   Prednisone Shortness Of Breath, Itching and Rash   Propoxyphene N-Acetaminophen Itching and Nausea And Vomiting     Darvocet    Gentamicin Rash    Cream   Sulfa Antibiotics Itching and Nausea And Vomiting   Objective:  There were no vitals filed for this visit. There is no height or weight on file to calculate BMI. Constitutional Well developed. Well nourished.  Vascular Foot warm and well perfused. Capillary refill normal to all digits.   Neurologic Normal speech. Oriented to person, place, and time. Epicritic sensation to light touch grossly present bilaterally.  Dermatologic Skin healing well without signs of infection. Skin edges well coapted without signs  of infection. Heel no open lesions but area tender to palpation.   Orthopedic: Tenderness to palpation noted about the surgical site.   Radiographs: Interval amputation of right hallux at level of IPJ.  Assessment:   1. Post-operative state     Plan:  Patient was evaluated and treated and all questions answered.  S/p foot surgery right -Progressing as expected post-operatively. -WB Status: May begin transition to [facility] in regular shoes.  -Medications: n/a  Follow-up 3 months for rfc.   No follow-ups on file.

## 2023-03-04 ENCOUNTER — Encounter (HOSPITAL_COMMUNITY)
Admission: RE | Admit: 2023-03-04 | Discharge: 2023-03-04 | Disposition: A | Discharge status: Home / Self Care | Payer: Medicare PPO | Source: Ambulatory Visit | Attending: Nephrology | Admitting: Nephrology

## 2023-03-04 VITALS — BP 103/60 | HR 61 | Temp 97.8°F | Resp 18

## 2023-03-04 DIAGNOSIS — D500 Iron deficiency anemia secondary to blood loss (chronic): Secondary | ICD-10-CM

## 2023-03-04 DIAGNOSIS — D631 Anemia in chronic kidney disease: Secondary | ICD-10-CM | POA: Diagnosis not present

## 2023-03-04 DIAGNOSIS — N183 Chronic kidney disease, stage 3 unspecified: Secondary | ICD-10-CM | POA: Diagnosis not present

## 2023-03-04 MED ORDER — ACETAMINOPHEN 325 MG PO TABS
650.0000 mg | ORAL_TABLET | Freq: Once | ORAL | Status: AC
  Administered 2023-03-04: 650 mg via ORAL

## 2023-03-04 MED ORDER — SODIUM CHLORIDE 0.9 % IV SOLN
200.0000 mg | Freq: Once | INTRAVENOUS | Status: AC
  Administered 2023-03-04: 200 mg via INTRAVENOUS
  Filled 2023-03-04: qty 200

## 2023-03-04 MED ORDER — DIPHENHYDRAMINE HCL 25 MG PO CAPS
25.0000 mg | ORAL_CAPSULE | Freq: Once | ORAL | Status: AC
  Administered 2023-03-04: 25 mg via ORAL

## 2023-03-04 NOTE — Addendum Note (Signed)
Encounter addended by: Wyvonne Lenz, RN on: 03/04/2023 4:38 PM  Actions taken: LDA properties accepted

## 2023-03-04 NOTE — Progress Notes (Signed)
Diagnosis: Anemia in Chronic Kidney Disease  Provider:  Celso Amy MD  Procedure: IV Infusion  IV Type: Peripheral, IV Location: L Antecubital  Venofer (Iron Sucrose), Dose: 200 mg  Infusion Start Time: 1420  Infusion Stop Time: 1445  Post Infusion IV Care: Patient declined observation and Peripheral IV Discontinued  Discharge: Condition: Good, Destination: Home . AVS Provided  Performed by:  Fritzi Mandes, RN

## 2023-03-04 NOTE — Addendum Note (Signed)
Encounter addended by: Wyvonne Lenz, RN on: 03/04/2023 3:50 PM  Actions taken: Therapy plan modified

## 2023-03-06 ENCOUNTER — Encounter (HOSPITAL_COMMUNITY): Payer: Medicare PPO

## 2023-03-07 ENCOUNTER — Encounter (HOSPITAL_COMMUNITY)
Admission: RE | Admit: 2023-03-07 | Discharge: 2023-03-07 | Disposition: A | Discharge status: Home / Self Care | Payer: Medicare PPO | Source: Ambulatory Visit | Attending: Nephrology | Admitting: Nephrology

## 2023-03-07 VITALS — BP 114/64 | HR 73 | Temp 98.0°F | Resp 17

## 2023-03-07 DIAGNOSIS — N183 Chronic kidney disease, stage 3 unspecified: Secondary | ICD-10-CM | POA: Diagnosis not present

## 2023-03-07 DIAGNOSIS — D500 Iron deficiency anemia secondary to blood loss (chronic): Secondary | ICD-10-CM | POA: Diagnosis not present

## 2023-03-07 DIAGNOSIS — D631 Anemia in chronic kidney disease: Secondary | ICD-10-CM | POA: Diagnosis not present

## 2023-03-07 LAB — POCT HEMOGLOBIN-HEMACUE: Hemoglobin: 9.4 g/dL — ABNORMAL LOW (ref 12.0–15.0)

## 2023-03-07 MED ORDER — EPOETIN ALFA-EPBX 10000 UNIT/ML IJ SOLN
10000.0000 [IU] | Freq: Once | INTRAMUSCULAR | Status: AC
  Administered 2023-03-07: 10000 [IU] via SUBCUTANEOUS

## 2023-03-07 NOTE — Addendum Note (Signed)
Encounter addended by: Wyvonne Lenz, RN on: 03/07/2023 3:20 PM  Actions taken: Therapy plan modified

## 2023-03-07 NOTE — Progress Notes (Signed)
Diagnosis: Anemia in Chronic Kidney Disease  Provider:  Celso Amy MD  Procedure: Injection  Retacrit (epoetin alfa-epbx), Dose: 10000 Units, Site: subcutaneous, Number of injections: 1  HGB9.4  Post Care: Patient declined observation  Discharge: Condition: Good, Destination: Home . AVS Provided  Performed by:  Jasper Riling, RN

## 2023-03-11 ENCOUNTER — Encounter (HOSPITAL_COMMUNITY)
Admission: RE | Admit: 2023-03-11 | Discharge: 2023-03-11 | Disposition: A | Discharge status: Home / Self Care | Payer: Medicare PPO | Source: Ambulatory Visit | Attending: Nephrology | Admitting: Nephrology

## 2023-03-11 VITALS — BP 124/59 | HR 63 | Temp 97.9°F | Resp 20

## 2023-03-11 DIAGNOSIS — D500 Iron deficiency anemia secondary to blood loss (chronic): Secondary | ICD-10-CM | POA: Diagnosis not present

## 2023-03-11 DIAGNOSIS — D631 Anemia in chronic kidney disease: Secondary | ICD-10-CM | POA: Diagnosis not present

## 2023-03-11 DIAGNOSIS — N183 Chronic kidney disease, stage 3 unspecified: Secondary | ICD-10-CM | POA: Diagnosis not present

## 2023-03-11 MED ORDER — SODIUM CHLORIDE 0.9 % IV SOLN
200.0000 mg | Freq: Once | INTRAVENOUS | Status: AC
  Administered 2023-03-11: 200 mg via INTRAVENOUS
  Filled 2023-03-11: qty 200

## 2023-03-11 MED ORDER — DIPHENHYDRAMINE HCL 25 MG PO CAPS
25.0000 mg | ORAL_CAPSULE | Freq: Once | ORAL | Status: AC
  Administered 2023-03-11: 25 mg via ORAL

## 2023-03-11 MED ORDER — ACETAMINOPHEN 325 MG PO TABS
650.0000 mg | ORAL_TABLET | Freq: Once | ORAL | Status: AC
  Administered 2023-03-11: 650 mg via ORAL

## 2023-03-11 NOTE — Progress Notes (Signed)
Diagnosis: Anemia in Chronic Kidney Disease  Provider:  Celso Amy MD  Procedure: IV Infusion  IV Type: Peripheral, IV Location: L Antecubital  Venofer (Iron Sucrose), Dose: 200 mg  Infusion Start Time: 1354  Infusion Stop Time: 1411  Post Infusion IV Care: Patient declined observation and Peripheral IV Discontinued  Discharge: Condition: Good, Destination: Home . AVS Provided  Performed by:  Wyvonne Lenz, RN

## 2023-03-13 ENCOUNTER — Encounter (HOSPITAL_COMMUNITY): Payer: Medicare PPO

## 2023-03-15 ENCOUNTER — Other Ambulatory Visit: Payer: Self-pay | Admitting: Cardiology

## 2023-03-15 ENCOUNTER — Other Ambulatory Visit: Payer: Self-pay | Admitting: Family Medicine

## 2023-03-21 ENCOUNTER — Encounter (HOSPITAL_COMMUNITY)
Admission: RE | Admit: 2023-03-21 | Discharge: 2023-03-21 | Disposition: A | Discharge status: Home / Self Care | Payer: Medicare PPO | Source: Ambulatory Visit | Attending: Nephrology | Admitting: Nephrology

## 2023-03-21 VITALS — BP 129/86 | HR 61 | Temp 98.2°F

## 2023-03-21 DIAGNOSIS — N183 Chronic kidney disease, stage 3 unspecified: Secondary | ICD-10-CM | POA: Diagnosis not present

## 2023-03-21 DIAGNOSIS — D631 Anemia in chronic kidney disease: Secondary | ICD-10-CM | POA: Diagnosis not present

## 2023-03-21 DIAGNOSIS — D500 Iron deficiency anemia secondary to blood loss (chronic): Secondary | ICD-10-CM | POA: Diagnosis not present

## 2023-03-21 LAB — POCT HEMOGLOBIN-HEMACUE: Hemoglobin: 8.4 g/dL — ABNORMAL LOW (ref 12.0–15.0)

## 2023-03-21 MED ORDER — EPOETIN ALFA-EPBX 10000 UNIT/ML IJ SOLN
10000.0000 [IU] | Freq: Once | INTRAMUSCULAR | Status: AC
  Administered 2023-03-21: 10000 [IU] via SUBCUTANEOUS

## 2023-03-21 NOTE — Progress Notes (Signed)
Diagnosis: Anemia in Chronic Kidney Disease  Provider:  Manpreet Bhutani MD  Procedure: Injection  Retacrit (epoetin alfa-epbx), Dose: 10000 Units, Site: subcutaneous in abdomen, Number of injections: 1  Hgb 8.4   Post Care: Patient declined observation  Discharge: Condition: Good, Destination: Home . AVS provided.  Performed by:  Arrie Senate, RN

## 2023-03-22 ENCOUNTER — Ambulatory Visit (HOSPITAL_COMMUNITY): Payer: Medicare PPO

## 2023-03-30 DIAGNOSIS — Z79899 Other long term (current) drug therapy: Secondary | ICD-10-CM | POA: Diagnosis not present

## 2023-03-30 DIAGNOSIS — R109 Unspecified abdominal pain: Secondary | ICD-10-CM | POA: Diagnosis not present

## 2023-03-30 DIAGNOSIS — G4700 Insomnia, unspecified: Secondary | ICD-10-CM | POA: Diagnosis not present

## 2023-03-30 DIAGNOSIS — R55 Syncope and collapse: Secondary | ICD-10-CM | POA: Diagnosis not present

## 2023-03-30 DIAGNOSIS — F03B0 Unspecified dementia, moderate, without behavioral disturbance, psychotic disturbance, mood disturbance, and anxiety: Secondary | ICD-10-CM | POA: Diagnosis not present

## 2023-03-30 DIAGNOSIS — N189 Chronic kidney disease, unspecified: Secondary | ICD-10-CM | POA: Diagnosis not present

## 2023-03-30 DIAGNOSIS — N179 Acute kidney failure, unspecified: Secondary | ICD-10-CM | POA: Diagnosis not present

## 2023-03-30 DIAGNOSIS — E1122 Type 2 diabetes mellitus with diabetic chronic kidney disease: Secondary | ICD-10-CM | POA: Diagnosis not present

## 2023-03-30 DIAGNOSIS — M810 Age-related osteoporosis without current pathological fracture: Secondary | ICD-10-CM | POA: Diagnosis not present

## 2023-03-30 DIAGNOSIS — G20A1 Parkinson's disease without dyskinesia, without mention of fluctuations: Secondary | ICD-10-CM | POA: Diagnosis not present

## 2023-03-30 DIAGNOSIS — G300 Alzheimer's disease with early onset: Secondary | ICD-10-CM | POA: Diagnosis not present

## 2023-03-30 DIAGNOSIS — R059 Cough, unspecified: Secondary | ICD-10-CM | POA: Diagnosis not present

## 2023-03-30 DIAGNOSIS — Z794 Long term (current) use of insulin: Secondary | ICD-10-CM | POA: Diagnosis not present

## 2023-03-30 DIAGNOSIS — I491 Atrial premature depolarization: Secondary | ICD-10-CM | POA: Diagnosis not present

## 2023-03-30 DIAGNOSIS — Z7401 Bed confinement status: Secondary | ICD-10-CM | POA: Diagnosis not present

## 2023-03-30 DIAGNOSIS — E119 Type 2 diabetes mellitus without complications: Secondary | ICD-10-CM | POA: Diagnosis not present

## 2023-03-30 DIAGNOSIS — E1142 Type 2 diabetes mellitus with diabetic polyneuropathy: Secondary | ICD-10-CM | POA: Diagnosis not present

## 2023-03-30 DIAGNOSIS — F02B0 Dementia in other diseases classified elsewhere, moderate, without behavioral disturbance, psychotic disturbance, mood disturbance, and anxiety: Secondary | ICD-10-CM | POA: Diagnosis not present

## 2023-03-30 DIAGNOSIS — G479 Sleep disorder, unspecified: Secondary | ICD-10-CM | POA: Diagnosis not present

## 2023-03-30 DIAGNOSIS — Z20822 Contact with and (suspected) exposure to covid-19: Secondary | ICD-10-CM | POA: Diagnosis not present

## 2023-03-30 DIAGNOSIS — R5381 Other malaise: Secondary | ICD-10-CM | POA: Diagnosis not present

## 2023-03-30 DIAGNOSIS — I6782 Cerebral ischemia: Secondary | ICD-10-CM | POA: Diagnosis not present

## 2023-03-30 DIAGNOSIS — K529 Noninfective gastroenteritis and colitis, unspecified: Secondary | ICD-10-CM | POA: Diagnosis not present

## 2023-03-30 DIAGNOSIS — I700 Atherosclerosis of aorta: Secondary | ICD-10-CM | POA: Diagnosis not present

## 2023-03-30 DIAGNOSIS — F0390 Unspecified dementia without behavioral disturbance: Secondary | ICD-10-CM | POA: Diagnosis not present

## 2023-03-30 DIAGNOSIS — I499 Cardiac arrhythmia, unspecified: Secondary | ICD-10-CM | POA: Diagnosis not present

## 2023-03-30 DIAGNOSIS — I129 Hypertensive chronic kidney disease with stage 1 through stage 4 chronic kidney disease, or unspecified chronic kidney disease: Secondary | ICD-10-CM | POA: Diagnosis not present

## 2023-03-30 DIAGNOSIS — R531 Weakness: Secondary | ICD-10-CM | POA: Diagnosis not present

## 2023-03-30 DIAGNOSIS — E876 Hypokalemia: Secondary | ICD-10-CM | POA: Diagnosis not present

## 2023-03-30 DIAGNOSIS — R339 Retention of urine, unspecified: Secondary | ICD-10-CM | POA: Diagnosis not present

## 2023-03-30 DIAGNOSIS — E860 Dehydration: Secondary | ICD-10-CM | POA: Diagnosis not present

## 2023-03-30 DIAGNOSIS — K219 Gastro-esophageal reflux disease without esophagitis: Secondary | ICD-10-CM | POA: Diagnosis not present

## 2023-03-30 DIAGNOSIS — F5105 Insomnia due to other mental disorder: Secondary | ICD-10-CM | POA: Diagnosis not present

## 2023-03-30 DIAGNOSIS — K649 Unspecified hemorrhoids: Secondary | ICD-10-CM | POA: Diagnosis not present

## 2023-04-04 ENCOUNTER — Encounter (HOSPITAL_COMMUNITY): Admission: RE | Admit: 2023-04-04 | Payer: Medicare PPO | Source: Ambulatory Visit

## 2023-04-05 ENCOUNTER — Ambulatory Visit (HOSPITAL_COMMUNITY): Payer: Medicare PPO

## 2023-04-08 ENCOUNTER — Telehealth: Payer: Self-pay | Admitting: Family Medicine

## 2023-04-08 NOTE — Telephone Encounter (Signed)
Patient daughter Laura Atkins [facility]) called needs to know can she have an appointment possible without the patient patient currently in the hospital needs some answer about her mom. Laura Atkins asked if nurse or Dr. Lodema Hong would give her a call back # [phone]

## 2023-04-09 NOTE — Telephone Encounter (Signed)
Cannot do a visit for Laura Atkins under her name without the patient being present.

## 2023-04-10 ENCOUNTER — Other Ambulatory Visit: Payer: Self-pay

## 2023-04-10 NOTE — Telephone Encounter (Signed)
She is going to send her concerns thru a Clinical cytogeneticist message

## 2023-04-10 NOTE — Telephone Encounter (Signed)
Patients daughter aware

## 2023-04-10 NOTE — Progress Notes (Deleted)
Name: Laura Atkins DOB: 1941/03/09 MRN: [id number]  History of Present Illness: [name] is a 82 y.o. female who presents today as a new patient at [facility]. All available relevant medical records have been reviewed.  - GU /GYN History: 1. OAB with urinary frequency, urgency, and urge incontinence. - Previously tried Vesicare 10 mg daily, Gemtesa 75 mg daily, Myrbetriq 50 mg daily.  At last visit with Dr. Pete Glatter on 04/02/2022:  Doing well.  Recent history:  Admitted 03/30/2023 - 7/***/2024 for AKI superimposed on CKD; prerenal etiology "Due to limited p.o. intake/dehydration, as well as heat exhaustion due to temperature of home kept in the high 80s by patient and her husband". Patient's creatinine 4.9 on arrival with BUN 142. Creatinine improved with IV fluids; advised patient to increase oral fluid intake daily. CT abdomen/pelvis w/o contrast on 04/08/2023 showed no GU stones, masses, or hydronephrosis.    Today: She reports chief complaint of urge incontinence.  She reports urinary ***frequency and ***urgency. She reports voiding *** times per day. She reports nocturia. She reports voiding *** times per night.  She {Actions; denies-reports:120008} urge incontinence.  She leaks *** times per ***day; {Actions; denies-reports:120008} wearing *** ***pads/***diapers per day.  She {Actions; denies-reports:120008} routine caffeine intake (*** cups of coffee / tea / soda per day).  She {Actions; denies-reports:120008} dysuria, gross hematuria, straining to void, or sensations of incomplete emptying.   Fall Screening: Do you usually have a device to assist in your mobility? {yes/no:20286} ***cane / ***walker / ***wheelchair  Medications: Current Outpatient Medications  Medication Sig Dispense Refill   Accu-Chek FastClix Lancets MISC 1 each by Does not apply route 4 (four) times daily. Use to monitor glucose levels 4 times daily; E11.65 306 each 2   ACCU-CHEK  GUIDE test strip USE TO CHECK BLOOD SUGAR FOUR TIMES DAILY 200 strip 1   Alcohol Swabs (B-D SINGLE USE SWABS REGULAR) PADS 1 each by Does not apply route 4 (four) times daily. Use to prep site for glucose monitoring 4 times daily; E11.65 300 each 2   amLODipine (NORVASC) 5 MG tablet TAKE 1 AND 1/2 TABLETS BY MOUTH DAILY 135 tablet 1   aspirin 81 MG tablet Take 81 mg by mouth daily. (Patient not taking: Reported on 12/19/2022)     atorvastatin (LIPITOR) 40 MG tablet TAKE 1 TABLET BY MOUTH EVERY DAY 30 tablet 2   benazepril (LOTENSIN) 20 MG tablet TAKE 1 TABLET BY MOUTH EVERY DAY 30 tablet 5   Blood Glucose Monitoring Suppl (ACCU-CHEK GUIDE ME) w/Device KIT 1 each by Does not apply route 4 (four) times daily. Use to monitor glucose levels 4 times daily; E11.65 1 kit 0   buPROPion (WELLBUTRIN XL) 150 MG 24 hr tablet TAKE 1 TABLET BY MOUTH EVERY MORNING 90 tablet 2   Cholecalciferol 125 MCG (5000 UT) TABS Take 5,000 Units by mouth daily.     Cyanocobalamin (VITAMIN B-12) 5000 MCG TBDP Take 5,000 mcg by mouth daily.     diphenhydrAMINE (BENADRYL) 25 MG tablet Take 25 mg by mouth at bedtime.     donepezil (ARICEPT) 10 MG tablet Take 1 tablet (10 mg total) by mouth at bedtime. 30 tablet 3   gabapentin (NEURONTIN) 400 MG capsule TAKE ONE CAPSULE BY MOUTH EVERY MORNING and TAKE ONE CAPSULE AT NOON and TAKE TWO CAPSULES AT BEDTIME 360 capsule 5   glucose blood test strip 1 each by Other route in the morning, at noon, in the evening, and at  bedtime. Use as instructed qid. E11.65     Incontinence Supply Disposable (TRANQUILITY UNDERGARMENTS) MISC 1 each by Does not apply route daily. 90 each 3   insulin degludec (TRESIBA FLEXTOUCH) 200 UNIT/ML FlexTouch Pen Inject 12 Units into the skin at bedtime. 12 mL 3   Insulin Pen Needle (EASY TOUCH PEN NEEDLES) 31G X 5 MM MISC USE TO INJECT INSULIN ONCE DAILY 100 each 2   Insulin Syringe-Needle U-100 (INSULIN SYRINGE 1CC/31GX5/16") 31G X 5/16" 1 ML MISC 1 each by Does not  apply route daily. Use to inject insulin daily; E11.65 90 each 2   Magnesium 250 MG TABS Take 500 mg by mouth daily.     Multiple Vitamin (MULTIVITAMIN) capsule Take 1 capsule by mouth daily.     NONFORMULARY OR COMPOUNDED ITEM Apply 1 each topically every 6 (six) hours as needed (perianal pain). Apothecary hemorrhoid cream. Apply rectally up to qid as directed. 1 each 0   omeprazole (PRILOSEC) 20 MG capsule TAKE ONE CAPSULE BY MOUTH EVERY DAY 30 capsule 2   QUEtiapine (SEROQUEL) 25 MG tablet Take 1 tablet (25 mg total) by mouth at bedtime as needed. 30 tablet 12   sertraline (ZOLOFT) 100 MG tablet TAKE 1 TABLET BY MOUTH DAILY 90 tablet 2   torsemide (DEMADEX) 20 MG tablet TAKE 2 TABLETS BY MOUTH EVERY DAY and additionally TAKE 1 TABLET EVERY OTHER DAY (Patient taking differently: Take 40 mg by mouth 2 (two) times daily.) 225 tablet 2   traZODone (DESYREL) 50 MG tablet TAKE 1/2 TABLET BY MOUTH AT BEDTIME FOR SLEEP 15 tablet 5   UNABLE TO FIND Walker x 1  DX unsteady gait, osteoarthritis of left knee 1 each 0   UNABLE TO FIND Elevated Toliet Seat x 1 DX: unsteady gait, back pain, osteoarthritis of left knee 1 each 0   UNABLE TO FIND Standing upright walker x 1  DX M54.40, M19.90 1 each 0   UNABLE TO FIND Incontinence pads and supplies 1 each 1   UNABLE TO FIND Rollator Walker 1 Product 0   UNABLE TO FIND XL Tranquility Briefs for incontinence. 1 Package 3   UNABLE TO FIND Premium overnight disposible absorbant underwear XL UPC- [id number] 14 each 11   No current facility-administered medications for this visit.    Allergies: Allergies  Allergen Reactions   Penicillins Shortness Of Breath, Itching and Rash   Prednisone Shortness Of Breath, Itching and Rash   Propoxyphene N-Acetaminophen Itching and Nausea And Vomiting    Darvocet    Gentamicin Rash    Cream   Sulfa Antibiotics Itching and Nausea And Vomiting    Past Medical History:  Diagnosis Date   Alpha thalassemia trait  01/26/2010   02/2012: Nl CBC ex H&H-10.7/34.8, MCV-69    Anemia    Anxiety    Anxiety and depression    Cellulitis 05/09/2017   Depression    Diabetes mellitus    Foot pain, right 04/30/2013   GERD (gastroesophageal reflux disease)    Headache(784.0)    Hyperlipidemia    Hypertension    Iron deficiency 01/21/2017   Microcytic anemia 01/26/2010   02/2012: Nl CBC ex H&H-10.7/34.8, MCV-69    NECK PAIN, CHRONIC 10/21/2008   +chronic back pain     Obesity    Obstructive sleep apnea    Osteoarthritis    Left knee; right shoulder; chronic neck and back pain   Pruritus    PVD (peripheral vascular disease) (HCC) 01/28/2014   Seizures (HCC)  Shoulder pain, right 04/14/2015   Tremor    This started months ago after her seizure progressing to very poor hand writing   Urinary incontinence    UTI (urinary tract infection) 01/18/2013   Past Surgical History:  Procedure Laterality Date   ABDOMINAL HYSTERECTOMY     AMPUTATION TOE Right 12/20/2022   Procedure: RIGHT TOE AMPUTATION TOE;  Surgeon: Louann Sjogren, DPM;  Location: [facility] OR;  Service: Podiatry;  Laterality: Right;   BREAST EXCISIONAL BIOPSY     Left; cyst   CATARACT EXTRACTION Right    12/2017   CATARACT EXTRACTION W/ INTRAOCULAR LENS IMPLANT Left 09/07/2013   CHOLECYSTECTOMY     COLONOSCOPY     COLONOSCOPY N/A 07/20/2015   Procedure: COLONOSCOPY;  Surgeon: Malissa Hippo, MD;  Location: [facility] SUITE;  Service: Endoscopy;  Laterality: N/A;  930   EYE SURGERY Left 09/07/2013   cataract   Family History  Problem Relation Age of Onset   Lung cancer Mother 28   Kidney disease Father    Diabetes Sister    Keloids Brother    ADD / ADHD Grandchild    Bipolar disorder Grandchild    Bipolar disorder Daughter    Seizures Daughter    Heart disease Daughter    Kidney disease Son    Neuropathy Son    Kidney disease Son    Edema Daughter    Breast cancer Daughter 26   Allergies Daughter    Alcohol abuse Neg Hx    Drug  abuse Neg Hx    Social History   Socioeconomic History   Marital status: Married    Spouse name: saunders   Number of children: 5   Years of education: 12   Highest education level: Not on file  Occupational History   Occupation: Disabled     Employer: RETIRED  Tobacco Use   Smoking status: Never    Passive exposure: Past   Smokeless tobacco: Never  Vaping Use   Vaping status: Never Used  Substance and Sexual Activity   Alcohol use: No    Alcohol/week: 0.0 standard drinks of alcohol   Drug use: No   Sexual activity: Yes    Birth control/protection: Surgical  Other Topics Concern   Not on file  Social History Narrative   07/27/20 Patient lives at home with her husband Shara Blazing) and dgtr- Jasmine December.  Patient is retired.    Right handed.    Five Children.    Caffeine- 2 daily   Social Determinants of Health   Financial Resource Strain: Low Risk  (04/01/2023)   [facility]   Overall Financial Resource Strain (CARDIA)    Difficulty of Paying Living Expenses: Not hard at all  Food Insecurity: No Food Insecurity (04/01/2023)   Received from [facility]   Hunger Vital Sign    Worried About Running Out of Food in the Last Year: Never true    Ran Out of Food in the Last Year: Never true  Transportation Needs: No Transportation Needs (04/01/2023)   Received from [facility] - Transportation    Lack of Transportation (Medical): No    Lack of Transportation (Non-Medical): No  Physical Activity: Inactive (04/18/2022)   Exercise Vital Sign    Days of Exercise per Week: 0 days    Minutes of Exercise per Session: 0 min  Stress: No Stress Concern Present (04/18/2022)   [employer] of Occupational Health - Occupational Stress Questionnaire  Feeling of Stress : Not at all  Social Connections: Moderately Integrated (04/18/2022)   Social Connection and Isolation Panel [NHANES]    Frequency of Communication with Friends and Family: More than three  times a week    Frequency of Social Gatherings with Friends and Family: More than three times a week    Attends Religious Services: More than 4 times per year    Active Member of [employer] or Organizations: No    Attends Banker Meetings: Never    Marital Status: Married  Catering manager Violence: Not At Risk (12/18/2022)   Humiliation, Afraid, Rape, and Kick questionnaire    Fear of Current or Ex-Partner: No    Emotionally Abused: No    Physically Abused: No    Sexually Abused: No    SUBJECTIVE  Review of Systems Constitutional: Patient ***denies any unintentional weight loss or change in strength lntegumentary: Patient ***denies any rashes or pruritus Eyes: Patient denies ***dry eyes ENT: Patient ***denies dry mouth Cardiovascular: Patient ***denies chest pain or syncope Respiratory: Patient ***denies shortness of breath Gastrointestinal: Patient ***denies nausea, vomiting, constipation, or diarrhea Musculoskeletal: Patient ***denies muscle cramps or weakness Neurologic: Patient ***denies convulsions or seizures Psychiatric: Patient ***denies memory problems Allergic/Immunologic: Patient ***denies recent allergic reaction(s) Hematologic/Lymphatic: Patient denies bleeding tendencies Endocrine: Patient ***denies heat/cold intolerance  GU: As per HPI.  OBJECTIVE There were no vitals filed for this visit. There is no height or weight on file to calculate BMI.  Physical Examination  Constitutional: ***No obvious distress; patient is ***non-toxic appearing  Cardiovascular: ***No visible lower extremity edema.  Respiratory: The patient does ***not have audible wheezing/stridor; respirations do ***not appear labored  Gastrointestinal: Abdomen ***non-distended Musculoskeletal: ***Normal ROM of UEs  Skin: ***No obvious rashes/open sores  Neurologic: CN 2-12 grossly ***intact Psychiatric: Answered questions ***appropriately with ***normal affect   Hematologic/Lymphatic/Immunologic: ***No obvious bruises or sites of spontaneous bleeding  UA: {Desc; negative/positive:13464} for *** WBC/hpf, *** RBC/hpf, bacteria (***) PVR: *** ml  ASSESSMENT Urge incontinence  History of UTI ***  Will plan for follow up in *** months or sooner if needed. Pt verbalized understanding and agreement. All questions were answered.  PLAN Advised the following: *** ***No follow-ups on file.  No orders of the defined types were placed in this encounter.   It has been explained that the patient is to follow regularly with their PCP in addition to all other providers involved in their care and to follow instructions provided by these respective offices. Patient advised to contact urology clinic if any urologic-pertaining questions, concerns, new symptoms or problems arise in the interim period.  There are no Patient Instructions on file for this visit.  Electronically signed by:  Donnita Falls, MSN, FNP-C, CUNP 04/10/2023 8:58 PM

## 2023-04-11 ENCOUNTER — Ambulatory Visit: Payer: Medicare PPO | Admitting: Urology

## 2023-04-11 ENCOUNTER — Telehealth: Payer: Medicare PPO | Admitting: Family Medicine

## 2023-04-11 DIAGNOSIS — N179 Acute kidney failure, unspecified: Secondary | ICD-10-CM | POA: Diagnosis not present

## 2023-04-11 DIAGNOSIS — G4700 Insomnia, unspecified: Secondary | ICD-10-CM | POA: Diagnosis not present

## 2023-04-11 DIAGNOSIS — G20A1 Parkinson's disease without dyskinesia, without mention of fluctuations: Secondary | ICD-10-CM | POA: Diagnosis not present

## 2023-04-11 DIAGNOSIS — K649 Unspecified hemorrhoids: Secondary | ICD-10-CM | POA: Diagnosis not present

## 2023-04-11 DIAGNOSIS — N3941 Urge incontinence: Secondary | ICD-10-CM

## 2023-04-11 DIAGNOSIS — F5105 Insomnia due to other mental disorder: Secondary | ICD-10-CM | POA: Diagnosis not present

## 2023-04-11 DIAGNOSIS — G300 Alzheimer's disease with early onset: Secondary | ICD-10-CM | POA: Diagnosis not present

## 2023-04-11 DIAGNOSIS — E876 Hypokalemia: Secondary | ICD-10-CM | POA: Diagnosis not present

## 2023-04-11 DIAGNOSIS — N189 Chronic kidney disease, unspecified: Secondary | ICD-10-CM | POA: Diagnosis not present

## 2023-04-11 DIAGNOSIS — Z87440 Personal history of urinary (tract) infections: Secondary | ICD-10-CM

## 2023-04-11 DIAGNOSIS — F03B0 Unspecified dementia, moderate, without behavioral disturbance, psychotic disturbance, mood disturbance, and anxiety: Secondary | ICD-10-CM | POA: Diagnosis not present

## 2023-04-11 DIAGNOSIS — R531 Weakness: Secondary | ICD-10-CM | POA: Diagnosis not present

## 2023-04-11 DIAGNOSIS — K219 Gastro-esophageal reflux disease without esophagitis: Secondary | ICD-10-CM | POA: Diagnosis not present

## 2023-04-11 DIAGNOSIS — Z79899 Other long term (current) drug therapy: Secondary | ICD-10-CM | POA: Diagnosis not present

## 2023-04-11 DIAGNOSIS — M810 Age-related osteoporosis without current pathological fracture: Secondary | ICD-10-CM | POA: Diagnosis not present

## 2023-04-11 DIAGNOSIS — I129 Hypertensive chronic kidney disease with stage 1 through stage 4 chronic kidney disease, or unspecified chronic kidney disease: Secondary | ICD-10-CM | POA: Diagnosis not present

## 2023-04-11 DIAGNOSIS — E538 Deficiency of other specified B group vitamins: Secondary | ICD-10-CM | POA: Diagnosis not present

## 2023-04-11 DIAGNOSIS — E785 Hyperlipidemia, unspecified: Secondary | ICD-10-CM | POA: Diagnosis not present

## 2023-04-11 DIAGNOSIS — D649 Anemia, unspecified: Secondary | ICD-10-CM | POA: Diagnosis not present

## 2023-04-11 DIAGNOSIS — Z794 Long term (current) use of insulin: Secondary | ICD-10-CM | POA: Diagnosis not present

## 2023-04-11 DIAGNOSIS — F0390 Unspecified dementia without behavioral disturbance: Secondary | ICD-10-CM | POA: Diagnosis not present

## 2023-04-11 DIAGNOSIS — I10 Essential (primary) hypertension: Secondary | ICD-10-CM | POA: Diagnosis not present

## 2023-04-11 DIAGNOSIS — G309 Alzheimer's disease, unspecified: Secondary | ICD-10-CM | POA: Diagnosis not present

## 2023-04-11 DIAGNOSIS — I700 Atherosclerosis of aorta: Secondary | ICD-10-CM | POA: Diagnosis not present

## 2023-04-11 DIAGNOSIS — Z7401 Bed confinement status: Secondary | ICD-10-CM | POA: Diagnosis not present

## 2023-04-11 DIAGNOSIS — F0280 Dementia in other diseases classified elsewhere without behavioral disturbance: Secondary | ICD-10-CM | POA: Diagnosis not present

## 2023-04-11 DIAGNOSIS — E118 Type 2 diabetes mellitus with unspecified complications: Secondary | ICD-10-CM | POA: Diagnosis not present

## 2023-04-11 DIAGNOSIS — R5381 Other malaise: Secondary | ICD-10-CM | POA: Diagnosis not present

## 2023-04-11 DIAGNOSIS — E1142 Type 2 diabetes mellitus with diabetic polyneuropathy: Secondary | ICD-10-CM | POA: Diagnosis not present

## 2023-04-11 DIAGNOSIS — R339 Retention of urine, unspecified: Secondary | ICD-10-CM | POA: Diagnosis not present

## 2023-04-15 DIAGNOSIS — G309 Alzheimer's disease, unspecified: Secondary | ICD-10-CM | POA: Diagnosis not present

## 2023-04-15 DIAGNOSIS — Z79899 Other long term (current) drug therapy: Secondary | ICD-10-CM | POA: Diagnosis not present

## 2023-04-15 DIAGNOSIS — N179 Acute kidney failure, unspecified: Secondary | ICD-10-CM | POA: Diagnosis not present

## 2023-04-15 DIAGNOSIS — F0280 Dementia in other diseases classified elsewhere without behavioral disturbance: Secondary | ICD-10-CM | POA: Diagnosis not present

## 2023-04-15 DIAGNOSIS — N189 Chronic kidney disease, unspecified: Secondary | ICD-10-CM | POA: Diagnosis not present

## 2023-04-16 DIAGNOSIS — E118 Type 2 diabetes mellitus with unspecified complications: Secondary | ICD-10-CM | POA: Diagnosis not present

## 2023-04-16 DIAGNOSIS — K219 Gastro-esophageal reflux disease without esophagitis: Secondary | ICD-10-CM | POA: Diagnosis not present

## 2023-04-16 DIAGNOSIS — I10 Essential (primary) hypertension: Secondary | ICD-10-CM | POA: Diagnosis not present

## 2023-04-16 DIAGNOSIS — M810 Age-related osteoporosis without current pathological fracture: Secondary | ICD-10-CM | POA: Diagnosis not present

## 2023-04-16 DIAGNOSIS — Z79899 Other long term (current) drug therapy: Secondary | ICD-10-CM | POA: Diagnosis not present

## 2023-04-16 DIAGNOSIS — Z794 Long term (current) use of insulin: Secondary | ICD-10-CM | POA: Diagnosis not present

## 2023-04-17 DIAGNOSIS — K219 Gastro-esophageal reflux disease without esophagitis: Secondary | ICD-10-CM | POA: Diagnosis not present

## 2023-04-17 DIAGNOSIS — N189 Chronic kidney disease, unspecified: Secondary | ICD-10-CM | POA: Diagnosis not present

## 2023-04-17 DIAGNOSIS — G309 Alzheimer's disease, unspecified: Secondary | ICD-10-CM | POA: Diagnosis not present

## 2023-04-17 DIAGNOSIS — I10 Essential (primary) hypertension: Secondary | ICD-10-CM | POA: Diagnosis not present

## 2023-04-17 DIAGNOSIS — N179 Acute kidney failure, unspecified: Secondary | ICD-10-CM | POA: Diagnosis not present

## 2023-04-17 DIAGNOSIS — M810 Age-related osteoporosis without current pathological fracture: Secondary | ICD-10-CM | POA: Diagnosis not present

## 2023-04-17 DIAGNOSIS — E118 Type 2 diabetes mellitus with unspecified complications: Secondary | ICD-10-CM | POA: Diagnosis not present

## 2023-04-18 DIAGNOSIS — Z794 Long term (current) use of insulin: Secondary | ICD-10-CM | POA: Diagnosis not present

## 2023-04-18 DIAGNOSIS — D649 Anemia, unspecified: Secondary | ICD-10-CM | POA: Diagnosis not present

## 2023-04-18 DIAGNOSIS — E538 Deficiency of other specified B group vitamins: Secondary | ICD-10-CM | POA: Diagnosis not present

## 2023-04-18 DIAGNOSIS — N189 Chronic kidney disease, unspecified: Secondary | ICD-10-CM | POA: Diagnosis not present

## 2023-04-18 DIAGNOSIS — E785 Hyperlipidemia, unspecified: Secondary | ICD-10-CM | POA: Diagnosis not present

## 2023-04-18 DIAGNOSIS — E118 Type 2 diabetes mellitus with unspecified complications: Secondary | ICD-10-CM | POA: Diagnosis not present

## 2023-04-25 ENCOUNTER — Telehealth: Payer: Self-pay | Admitting: Family Medicine

## 2023-04-25 DIAGNOSIS — N189 Chronic kidney disease, unspecified: Secondary | ICD-10-CM | POA: Diagnosis not present

## 2023-04-25 DIAGNOSIS — N179 Acute kidney failure, unspecified: Secondary | ICD-10-CM | POA: Diagnosis not present

## 2023-04-25 DIAGNOSIS — E118 Type 2 diabetes mellitus with unspecified complications: Secondary | ICD-10-CM | POA: Diagnosis not present

## 2023-04-25 DIAGNOSIS — E538 Deficiency of other specified B group vitamins: Secondary | ICD-10-CM | POA: Diagnosis not present

## 2023-04-25 DIAGNOSIS — F0280 Dementia in other diseases classified elsewhere without behavioral disturbance: Secondary | ICD-10-CM | POA: Diagnosis not present

## 2023-04-25 DIAGNOSIS — G309 Alzheimer's disease, unspecified: Secondary | ICD-10-CM | POA: Diagnosis not present

## 2023-04-25 DIAGNOSIS — I10 Essential (primary) hypertension: Secondary | ICD-10-CM | POA: Diagnosis not present

## 2023-04-25 DIAGNOSIS — D649 Anemia, unspecified: Secondary | ICD-10-CM | POA: Diagnosis not present

## 2023-04-25 DIAGNOSIS — K219 Gastro-esophageal reflux disease without esophagitis: Secondary | ICD-10-CM | POA: Diagnosis not present

## 2023-04-25 NOTE — Telephone Encounter (Signed)
Received after call fax patient is discharging today from nursing home and needs TOC follow up appointment with Dr Lodema Hong,. Merry Proud - can this be scheduled for 08.13.2024 at 1:40 pm. Can check with Dr Lodema Hong okay to see this patient to overbook. Will call back if okay.

## 2023-04-27 DIAGNOSIS — D631 Anemia in chronic kidney disease: Secondary | ICD-10-CM | POA: Diagnosis not present

## 2023-04-27 DIAGNOSIS — E1140 Type 2 diabetes mellitus with diabetic neuropathy, unspecified: Secondary | ICD-10-CM | POA: Diagnosis not present

## 2023-04-27 DIAGNOSIS — N179 Acute kidney failure, unspecified: Secondary | ICD-10-CM | POA: Diagnosis not present

## 2023-04-27 DIAGNOSIS — F02B3 Dementia in other diseases classified elsewhere, moderate, with mood disturbance: Secondary | ICD-10-CM | POA: Diagnosis not present

## 2023-04-27 DIAGNOSIS — G309 Alzheimer's disease, unspecified: Secondary | ICD-10-CM | POA: Diagnosis not present

## 2023-04-27 DIAGNOSIS — F32A Depression, unspecified: Secondary | ICD-10-CM | POA: Diagnosis not present

## 2023-04-27 DIAGNOSIS — M810 Age-related osteoporosis without current pathological fracture: Secondary | ICD-10-CM | POA: Diagnosis not present

## 2023-04-27 DIAGNOSIS — N189 Chronic kidney disease, unspecified: Secondary | ICD-10-CM | POA: Diagnosis not present

## 2023-04-27 DIAGNOSIS — I129 Hypertensive chronic kidney disease with stage 1 through stage 4 chronic kidney disease, or unspecified chronic kidney disease: Secondary | ICD-10-CM | POA: Diagnosis not present

## 2023-04-29 ENCOUNTER — Telehealth: Payer: Self-pay | Admitting: Family Medicine

## 2023-04-29 NOTE — Telephone Encounter (Signed)
Laura Atkins called from [facility], patient recently at [facility] for dementia and decrease in function nutritional, patient now at home.  Needs orders for nursing 1 week 4 on educational and also [facility] sent home only partial med list, needs to speak with nurse to discuss medication for patient. Call back  # [phone].

## 2023-04-30 ENCOUNTER — Other Ambulatory Visit: Payer: Self-pay | Admitting: Family Medicine

## 2023-04-30 NOTE — Telephone Encounter (Signed)
LMTRC-KG 

## 2023-05-02 ENCOUNTER — Telehealth: Payer: Self-pay | Admitting: Family Medicine

## 2023-05-02 NOTE — Telephone Encounter (Signed)
Tonya from [facility] calling says she is needing some more clarifications on pt medications- not available until after 9:30 am. [phone] Thank You

## 2023-05-02 NOTE — Telephone Encounter (Signed)
Roe Coombs called from [facility] let provider know that the daughter reported yesterday by text message to nurse patient has fallen patient seems okay.  Patient seems okay but patient will not share any information if she is hurt.  Home nurse suggested for patient to go to the ER. Trying to talk to patient for long term replacement for this patient it is to difficult at home to manage the patient.   Patient and daughter declined visit for occupational therapy visit yesterday daughter stated she would contact Don to schedule an visit if patient stays at home.   Don call back # [phone].

## 2023-05-03 NOTE — Telephone Encounter (Signed)
[facility] LVM to inform that they are unable to get in contact with pt daughter to follow up on fall- unsure of the state of patient, will follow up next week

## 2023-05-03 NOTE — Telephone Encounter (Signed)
Verified medication with [place]

## 2023-05-03 NOTE — Telephone Encounter (Signed)
noted 

## 2023-05-06 DIAGNOSIS — I700 Atherosclerosis of aorta: Secondary | ICD-10-CM

## 2023-05-06 DIAGNOSIS — E1140 Type 2 diabetes mellitus with diabetic neuropathy, unspecified: Secondary | ICD-10-CM | POA: Diagnosis not present

## 2023-05-06 DIAGNOSIS — G4700 Insomnia, unspecified: Secondary | ICD-10-CM

## 2023-05-06 DIAGNOSIS — M810 Age-related osteoporosis without current pathological fracture: Secondary | ICD-10-CM | POA: Diagnosis not present

## 2023-05-06 DIAGNOSIS — D631 Anemia in chronic kidney disease: Secondary | ICD-10-CM | POA: Diagnosis not present

## 2023-05-06 DIAGNOSIS — N179 Acute kidney failure, unspecified: Secondary | ICD-10-CM | POA: Diagnosis not present

## 2023-05-06 DIAGNOSIS — F32A Depression, unspecified: Secondary | ICD-10-CM | POA: Diagnosis not present

## 2023-05-06 DIAGNOSIS — Z794 Long term (current) use of insulin: Secondary | ICD-10-CM

## 2023-05-06 DIAGNOSIS — N189 Chronic kidney disease, unspecified: Secondary | ICD-10-CM | POA: Diagnosis not present

## 2023-05-06 DIAGNOSIS — I129 Hypertensive chronic kidney disease with stage 1 through stage 4 chronic kidney disease, or unspecified chronic kidney disease: Secondary | ICD-10-CM | POA: Diagnosis not present

## 2023-05-06 DIAGNOSIS — F0283 Dementia in other diseases classified elsewhere, unspecified severity, with mood disturbance: Secondary | ICD-10-CM | POA: Diagnosis not present

## 2023-05-06 DIAGNOSIS — G309 Alzheimer's disease, unspecified: Secondary | ICD-10-CM | POA: Diagnosis not present

## 2023-05-06 NOTE — Telephone Encounter (Signed)
done

## 2023-05-07 ENCOUNTER — Other Ambulatory Visit (HOSPITAL_COMMUNITY)
Admission: RE | Admit: 2023-05-07 | Discharge: 2023-05-07 | Disposition: A | Discharge status: Home / Self Care | Payer: Medicare PPO | Source: Ambulatory Visit | Attending: Family Medicine | Admitting: Family Medicine

## 2023-05-07 ENCOUNTER — Encounter: Payer: Self-pay | Admitting: Family Medicine

## 2023-05-07 ENCOUNTER — Ambulatory Visit (INDEPENDENT_AMBULATORY_CARE_PROVIDER_SITE_OTHER): Payer: Medicare PPO | Admitting: Family Medicine

## 2023-05-07 VITALS — BP 99/42 | HR 38 | Ht 68.0 in | Wt 227.0 lb

## 2023-05-07 DIAGNOSIS — E1169 Type 2 diabetes mellitus with other specified complication: Secondary | ICD-10-CM

## 2023-05-07 DIAGNOSIS — F03B18 Unspecified dementia, moderate, with other behavioral disturbance: Secondary | ICD-10-CM

## 2023-05-07 DIAGNOSIS — E559 Vitamin D deficiency, unspecified: Secondary | ICD-10-CM | POA: Insufficient documentation

## 2023-05-07 DIAGNOSIS — I10 Essential (primary) hypertension: Secondary | ICD-10-CM | POA: Insufficient documentation

## 2023-05-07 DIAGNOSIS — R159 Full incontinence of feces: Secondary | ICD-10-CM

## 2023-05-07 DIAGNOSIS — E785 Hyperlipidemia, unspecified: Secondary | ICD-10-CM

## 2023-05-07 DIAGNOSIS — Z7409 Other reduced mobility: Secondary | ICD-10-CM

## 2023-05-07 DIAGNOSIS — F419 Anxiety disorder, unspecified: Secondary | ICD-10-CM | POA: Diagnosis not present

## 2023-05-07 DIAGNOSIS — M5440 Lumbago with sciatica, unspecified side: Secondary | ICD-10-CM | POA: Diagnosis not present

## 2023-05-07 DIAGNOSIS — Z9181 History of falling: Secondary | ICD-10-CM

## 2023-05-07 DIAGNOSIS — E1149 Type 2 diabetes mellitus with other diabetic neurological complication: Secondary | ICD-10-CM | POA: Diagnosis not present

## 2023-05-07 DIAGNOSIS — F32A Depression, unspecified: Secondary | ICD-10-CM

## 2023-05-07 LAB — TSH: TSH: 2.026 u[IU]/mL (ref 0.350–4.500)

## 2023-05-07 LAB — VITAMIN D 25 HYDROXY (VIT D DEFICIENCY, FRACTURES): Vit D, 25-Hydroxy: 57.09 ng/mL (ref 30–100)

## 2023-05-07 MED ORDER — GABAPENTIN 400 MG PO CAPS: 400.0000 mg | ORAL_CAPSULE | Freq: Two times a day (BID) | ORAL | 3 refills | Status: DC

## 2023-05-07 MED ORDER — QUETIAPINE FUMARATE 100 MG PO TABS: ORAL_TABLET | ORAL | 2 refills | Status: DC

## 2023-05-07 MED ORDER — AMLODIPINE BESYLATE 2.5 MG PO TABS: 2.5000 mg | ORAL_TABLET | Freq: Every day | ORAL | 2 refills | Status: DC

## 2023-05-07 MED ORDER — MIRTAZAPINE 15 MG PO TABS: 15.0000 mg | ORAL_TABLET | Freq: Every day | ORAL | 2 refills | Status: DC

## 2023-05-07 NOTE — Patient Instructions (Addendum)
F/U in 4 to 6 weeks re evaluate blood pressure, call if you need me sooner  You look better overall and blood sugar is excellent  Blood pressure is low, amlodipine is reduced to 2.5 mg daily  Please get TSH and vit D  level drawn next week at the hospital the same day you are drawing labs for Dr Maryann Alar OF YOUR SAFETY, You NEED to return to a skilled nursing facility, you have already had 1 fall since you returned home !  You NEED to DECIDE to listen to and co operate with your family when they are responsible for taking care of you to be able to stay at home  [facility] family reports has been a very good place for you as far as gianing strength and your care so I RECOMMEND returning there until family and staff feel that you can return home again  [facility] form will be completed by the end of this week, family will be contacted  Thanks for choosing [facility], we consider it a privelige to serve you.

## 2023-05-07 NOTE — Progress Notes (Signed)
Laura Atkins     MRN: [id number]      DOB: Sep 07, 1941  Chief Complaint  Patient presents with   Follow-up    D/c from facility on 04/26/23 fall last Monday at home patient family requesting to keep medications the same as when discharged    HPI Laura Atkins is here for follow up and re-evaluation of chronic medical conditions, medication management and review of any available recent lab and radiology data.  Laura Atkins last week Monday since going home, fell off toilet went on her own with spouse with dementia althuough her daughter was there and had been asking her repeatedly if she needed to go to the bathroom so that she could assist her Daughter, Dois Atkins,  and grand daughter present as well as daughter Laura Atkins via telephone are all saying that it is NOT POSSIBLE to have Laura Atkins at home at this time as much as they want to , because sh does not follow instructions, has fallen in less than 2 weeks and remains a very high fall risk 'They report strengthening and overall improvement during er stay at [facility] for rehab and have since been aggressively looking for placement, her return to [facility] is best situation and paperwork to have this done will be completed and returned  Denies polyuria, polydipsia, blurred vision , or hypoglycemic episodes.   ROS Denies recent fever or chills. Denies sinus pressure, nasal congestion, ear pain or sore throat. Denies chest congestion, productive cough or wheezing. Denies chest pains, palpitations and leg swelling Denies abdominal pain, nausea, vomiting,diarrhea or constipation.   Denies dysuria, frequency, hesitancy or incontinence. C/o  joint pain, and limitation in mobility. Denies headaches, seizures, numbness, or tingling. Denies uncontrolled depression, anxiety or insomnia.Current medications work well Denies skin break down or rash.   PE  BP (!) 99/42 (BP Location: Right Arm, Patient Position: Sitting, Cuff Size: Large)   Pulse (!) 38    Ht 5\' 8"  (1.727 m)   Wt 227 lb (103 kg)   SpO2 94%   BMI 34.52 kg/m   Patient alert and oriented  HEENT: No facial asymmetry, EOMI,     Neck decreased ROM.  Chest: Clear to auscultation bilaterally.  CVS: S1, S2 no murmurs, no S3.Regular rate.  ABD: Soft non tender.   Ext: No edema  Laura: markedly decreased  ROM spine, shoulders, hips and knees.  Skin: Intact, no ulcerations or rash noted.  Psych: Good eye contact, normal affect. Memory impaired t not anxious or depressed appearing.  CNS: CN 2-12 intact, power,  normal throughout.no focal deficits noted.   Assessment & Plan  Essential hypertension Over corrected , reduce amlodipine to 2.5 mg daily and re eval  Type 2 diabetes mellitus with neurological complications (HCC) Laura Atkins is reminded of the importance of commitment to daily physical activity for 30 minutes or more, as able and the need to limit carbohydrate intake to 30 to 60 grams per meal to help with blood sugar control.   The need to take medication as prescribed, test blood sugar as directed, and to call between visits if there is a concern that blood sugar is uncontrolled is also discussed.   Laura Atkins is reminded of the importance of daily foot exam, annual eye examination, and good blood sugar, blood pressure and cholesterol control.     Latest Ref Rng & Units 02/07/2023    2:31 PM 12/27/2022    2:24 PM 12/22/2022   11:44 AM 12/21/2022  6:52 AM 12/20/2022    4:21 AM  Diabetic Labs  Creatinine 0.44 - 1.00 mg/dL 1.61  0.96  0.45  4.09  1.34       05/07/2023    1:39 PM 03/21/2023    2:29 PM 03/11/2023    2:12 PM 03/11/2023    1:12 PM 03/11/2023    1:11 PM 03/07/2023    2:21 PM 03/04/2023    2:52 PM  BP/Weight  Systolic BP 99 129 124 108 129 114 103  Diastolic BP 42 86 59 51 36 64 60  Wt. (Lbs) 227        BMI 34.52 kg/m2            Latest Ref Rng & Units 12/01/2021    9:40 AM 02/15/2021   12:00 AM  Foot/eye exam completion dates  Eye Exam No  Retinopathy  Retinopathy      Foot Form Completion  Done      This result is from an external source.      Controlled, no change in medication Managed by Endo  Hyperlipidemia associated with type 2 diabetes mellitus (HCC) Hyperlipidemia:Low fat diet discussed and encouraged.   Lipid Panel  Lab Results  Component Value Date   CHOL 153 10/24/2021   HDL 60 10/24/2021   LDLCALC 70 10/24/2021   TRIG 136 10/24/2021   CHOLHDL 2.6 10/24/2021     Updated lab needed at/ before next visit.   Back pain of lumbar region with sciatica Wheelchair dependent an at high fall risk with recent fall at home, needs SNF as pt does not cooperate with family  Limited mobility Rehab facility for strengthening and sue to recent fall in 2 week period since return home recommend SNF placement for longer period  Fecal incontinence Neds incontinence underwear  Dementia (HCC) Continue aicept 10 mg, behavioral challenge with family in that she will not obey safety instructions and has falls, has also been reported to being combative at times  Anxiety and depression Inadequately controlled primarily due to increased debility and need for SNF placement, no med change, not suicidal or homicidal  At high risk for falls Instability, wheelchair dependent and fall in past 2 weeks since returning home  from SNF

## 2023-05-08 ENCOUNTER — Encounter (INDEPENDENT_AMBULATORY_CARE_PROVIDER_SITE_OTHER): Payer: Self-pay | Admitting: Gastroenterology

## 2023-05-09 ENCOUNTER — Telehealth: Payer: Self-pay | Admitting: Family Medicine

## 2023-05-09 NOTE — Telephone Encounter (Signed)
Pt daughter here says she was told to she would be able to pick up FL2 forms for the patient by the end of the week. Says she will check back tomorrow. Please advise Thank you

## 2023-05-12 ENCOUNTER — Encounter: Payer: Self-pay | Admitting: Family Medicine

## 2023-05-12 DIAGNOSIS — Z9181 History of falling: Secondary | ICD-10-CM | POA: Insufficient documentation

## 2023-05-12 NOTE — Assessment & Plan Note (Signed)
Laura Atkins is reminded of the importance of commitment to daily physical activity for 30 minutes or more, as able and the need to limit carbohydrate intake to 30 to 60 grams per meal to help with blood sugar control.   The need to take medication as prescribed, test blood sugar as directed, and to call between visits if there is a concern that blood sugar is uncontrolled is also discussed.   Laura Atkins is reminded of the importance of daily foot exam, annual eye examination, and good blood sugar, blood pressure and cholesterol control.     Latest Ref Rng & Units 02/07/2023    2:31 PM 12/27/2022    2:24 PM 12/22/2022   11:44 AM 12/21/2022    6:52 AM 12/20/2022    4:21 AM  Diabetic Labs  Creatinine 0.44 - 1.00 mg/dL 1.19  1.47  8.29  5.62  1.34       05/07/2023    1:39 PM 03/21/2023    2:29 PM 03/11/2023    2:12 PM 03/11/2023    1:12 PM 03/11/2023    1:11 PM 03/07/2023    2:21 PM 03/04/2023    2:52 PM  BP/Weight  Systolic BP 99 129 124 108 129 114 103  Diastolic BP 42 86 59 51 36 64 60  Wt. (Lbs) 227        BMI 34.52 kg/m2            Latest Ref Rng & Units 12/01/2021    9:40 AM 02/15/2021   12:00 AM  Foot/eye exam completion dates  Eye Exam No Retinopathy  Retinopathy      Foot Form Completion  Done      This result is from an external source.      Controlled, no change in medication Managed by Endo

## 2023-05-12 NOTE — Assessment & Plan Note (Signed)
Neds incontinence underwear

## 2023-05-12 NOTE — Assessment & Plan Note (Signed)
Wheelchair dependent an at high fall risk with recent fall at home, needs SNF as pt does not cooperate with family

## 2023-05-12 NOTE — Assessment & Plan Note (Signed)
Rehab facility for strengthening and sue to recent fall in 2 week period since return home recommend SNF placement for longer period

## 2023-05-12 NOTE — Assessment & Plan Note (Signed)
Hyperlipidemia:Low fat diet discussed and encouraged.   Lipid Panel  Lab Results  Component Value Date   CHOL 153 10/24/2021   HDL 60 10/24/2021   LDLCALC 70 10/24/2021   TRIG 136 10/24/2021   CHOLHDL 2.6 10/24/2021     Updated lab needed at/ before next visit.

## 2023-05-12 NOTE — Assessment & Plan Note (Signed)
Inadequately controlled primarily due to increased debility and need for SNF placement, no med change, not suicidal or homicidal

## 2023-05-12 NOTE — Assessment & Plan Note (Addendum)
Continue aicept 10 mg, behavioral challenge with family in that she will not obey safety instructions and has falls, has also been reported to being combative at times

## 2023-05-12 NOTE — Assessment & Plan Note (Signed)
Over corrected , reduce amlodipine to 2.5 mg daily and re eval

## 2023-05-12 NOTE — Assessment & Plan Note (Signed)
Instability, wheelchair dependent and fall in past 2 weeks since returning home  from SNF

## 2023-05-13 DIAGNOSIS — N184 Chronic kidney disease, stage 4 (severe): Secondary | ICD-10-CM | POA: Diagnosis not present

## 2023-05-13 DIAGNOSIS — E1122 Type 2 diabetes mellitus with diabetic chronic kidney disease: Secondary | ICD-10-CM | POA: Diagnosis not present

## 2023-05-13 DIAGNOSIS — D638 Anemia in other chronic diseases classified elsewhere: Secondary | ICD-10-CM | POA: Diagnosis not present

## 2023-05-13 DIAGNOSIS — I129 Hypertensive chronic kidney disease with stage 1 through stage 4 chronic kidney disease, or unspecified chronic kidney disease: Secondary | ICD-10-CM | POA: Diagnosis not present

## 2023-05-13 NOTE — Telephone Encounter (Signed)
FL2 forms complete awaiting signature will call when ready

## 2023-05-16 NOTE — Telephone Encounter (Signed)
Pt aware forms ready for pick up , will be picked up after 4pm today

## 2023-05-21 ENCOUNTER — Telehealth: Payer: Self-pay | Admitting: Family Medicine

## 2023-05-21 ENCOUNTER — Other Ambulatory Visit: Payer: Self-pay

## 2023-05-21 DIAGNOSIS — D500 Iron deficiency anemia secondary to blood loss (chronic): Secondary | ICD-10-CM

## 2023-05-21 NOTE — Telephone Encounter (Signed)
Laura Atkins daughter called said needs:  send in the order for in home care for occupational therapy to home home health aid along with letter saying about need for physical therapy.

## 2023-05-22 ENCOUNTER — Other Ambulatory Visit: Payer: Self-pay

## 2023-05-22 ENCOUNTER — Inpatient Hospital Stay: Payer: Medicare PPO | Attending: Hematology

## 2023-05-22 DIAGNOSIS — E1122 Type 2 diabetes mellitus with diabetic chronic kidney disease: Secondary | ICD-10-CM | POA: Insufficient documentation

## 2023-05-22 DIAGNOSIS — N184 Chronic kidney disease, stage 4 (severe): Secondary | ICD-10-CM | POA: Insufficient documentation

## 2023-05-22 DIAGNOSIS — D631 Anemia in chronic kidney disease: Secondary | ICD-10-CM | POA: Diagnosis not present

## 2023-05-22 DIAGNOSIS — D500 Iron deficiency anemia secondary to blood loss (chronic): Secondary | ICD-10-CM

## 2023-05-22 LAB — CBC WITH DIFFERENTIAL/PLATELET
Abs Immature Granulocytes: 0.01 10*3/uL (ref 0.00–0.07)
Basophils Absolute: 0 10*3/uL (ref 0.0–0.1)
Basophils Relative: 1 %
Eosinophils Absolute: 0.5 10*3/uL (ref 0.0–0.5)
Eosinophils Relative: 8 %
HCT: 31.4 % — ABNORMAL LOW (ref 36.0–46.0)
Hemoglobin: 9.1 g/dL — ABNORMAL LOW (ref 12.0–15.0)
Immature Granulocytes: 0 %
Lymphocytes Relative: 36 %
Lymphs Abs: 2 10*3/uL (ref 0.7–4.0)
MCH: 21.4 pg — ABNORMAL LOW (ref 26.0–34.0)
MCHC: 29 g/dL — ABNORMAL LOW (ref 30.0–36.0)
MCV: 73.7 fL — ABNORMAL LOW (ref 80.0–100.0)
Monocytes Absolute: 0.3 10*3/uL (ref 0.1–1.0)
Monocytes Relative: 5 %
Neutro Abs: 2.8 10*3/uL (ref 1.7–7.7)
Neutrophils Relative %: 50 %
Platelets: 232 10*3/uL (ref 150–400)
RBC: 4.26 MIL/uL (ref 3.87–5.11)
RDW: 17.5 % — ABNORMAL HIGH (ref 11.5–15.5)
WBC: 5.6 10*3/uL (ref 4.0–10.5)
nRBC: 0 % (ref 0.0–0.2)

## 2023-05-22 LAB — TECHNOLOGIST SMEAR REVIEW

## 2023-05-22 LAB — IRON AND TIBC
Iron: 63 ug/dL (ref 28–170)
Saturation Ratios: 19 % (ref 10.4–31.8)
TIBC: 326 ug/dL (ref 250–450)
UIBC: 263 ug/dL

## 2023-05-22 LAB — FERRITIN: Ferritin: 111 ng/mL (ref 11–307)

## 2023-05-23 ENCOUNTER — Encounter: Payer: Self-pay | Admitting: Oncology

## 2023-05-23 ENCOUNTER — Inpatient Hospital Stay (HOSPITAL_BASED_OUTPATIENT_CLINIC_OR_DEPARTMENT_OTHER): Payer: Medicare PPO | Admitting: Oncology

## 2023-05-23 VITALS — BP 138/48 | HR 72 | Temp 97.8°F | Resp 18

## 2023-05-23 DIAGNOSIS — N184 Chronic kidney disease, stage 4 (severe): Secondary | ICD-10-CM | POA: Diagnosis not present

## 2023-05-23 DIAGNOSIS — E1122 Type 2 diabetes mellitus with diabetic chronic kidney disease: Secondary | ICD-10-CM | POA: Diagnosis not present

## 2023-05-23 DIAGNOSIS — D631 Anemia in chronic kidney disease: Secondary | ICD-10-CM | POA: Diagnosis not present

## 2023-05-23 NOTE — Assessment & Plan Note (Signed)
Patient has anemia of chronic kidney disease [stage IV, GFR 28] Hemoglobin of 9.1 from labs yesterday Iron labs within normal limits Will start Procrit every 2 weeks until hemoglobin is greater than 10 Will monitor iron labs Patient is not on p.o. iron supplementation and is difficult for her with dementia Follow-up with GI regarding colonoscopy Return to clinic in 2 weeks with labs

## 2023-05-23 NOTE — Progress Notes (Signed)
[facility] at [facility] HEMATOLOGY NEW VISIT  Kerri Perches, MD  REASON FOR REFERRAL: Anemia of chronic kidney disease   HISTORY OF PRESENT ILLNESS: Laura Atkins 82 y.o. female referred by her nephrologist for anemia of chronic kidney disease with a possible iron deficiency competent.  Patient is accompanied by her daughter and granddaughter today.  She has a history of dementia, chronic kidney disease secondary to diabetes, diabetes.  Patient is a poor historian and daughter helps with filling in the gaps. Patient stated that she has been feeling good has no complaints today.  She feels fatigue sometimes with occasional shortness of breath but nothing different from her baseline.  Patient is limited functional, is not mobile, uses wheelchair and needs help of daughter and granddaughter for transfer.  She denies any blood in stools, melena, weight loss, loss of appetite. Patient has previously gotten IV iron but could not complete all the doses because she was feeling weak after infusions as per the daughter.  She was never on a Procrit.  She was recently admitted to the hospital for AKI and at that required 2 units of blood transfusion.  Labs from yesterday show a hemoglobin of 9.1 which is similar to/improved from prior.  Iron labs are within normal limits consistent with anemia of chronic disease.  Her last colonoscopy was in 2002, patient has difficulty with prep with dementia and they are working with gastroenterologist to come up with a plan for her.  She is a non-smoker, nonalcoholic.  No family history of blood disorders [thalassemia, sickle cell disease] and colon cancer.  I have reviewed the past medical history, past surgical history, social history and family history with the patient   ALLERGIES:  is allergic to penicillins, prednisone, propoxyphene n-acetaminophen, gentamicin, and sulfa antibiotics.  MEDICATIONS:  Current Outpatient Medications   Medication Sig Dispense Refill   Accu-Chek FastClix Lancets MISC 1 each by Does not apply route 4 (four) times daily. Use to monitor glucose levels 4 times daily; E11.65 306 each 2   ACCU-CHEK GUIDE test strip USE TO CHECK BLOOD SUGAR FOUR TIMES DAILY 200 strip 1   Alcohol Swabs (B-D SINGLE USE SWABS REGULAR) PADS 1 each by Does not apply route 4 (four) times daily. Use to prep site for glucose monitoring 4 times daily; E11.65 300 each 2   amLODipine (NORVASC) 2.5 MG tablet Take 1 tablet (2.5 mg total) by mouth daily. 30 tablet 2   aspirin 81 MG tablet Take 81 mg by mouth daily.     atorvastatin (LIPITOR) 40 MG tablet TAKE 1 TABLET BY MOUTH EVERY DAY 30 tablet 2   benazepril (LOTENSIN) 20 MG tablet TAKE 1 TABLET BY MOUTH EVERY DAY 30 tablet 5   Blood Glucose Monitoring Suppl (ACCU-CHEK GUIDE ME) w/Device KIT 1 each by Does not apply route 4 (four) times daily. Use to monitor glucose levels 4 times daily; E11.65 1 kit 0   buPROPion (WELLBUTRIN XL) 150 MG 24 hr tablet TAKE 1 TABLET BY MOUTH EVERY MORNING 90 tablet 2   Cholecalciferol 125 MCG (5000 UT) TABS Take 5,000 Units by mouth daily.     Cyanocobalamin (VITAMIN B-12) 5000 MCG TBDP Take 5,000 mcg by mouth daily.     diphenhydrAMINE (BENADRYL) 25 MG tablet Take 25 mg by mouth at bedtime.     donepezil (ARICEPT) 10 MG tablet Take 1 tablet (10 mg total) by mouth at bedtime. 30 tablet 3   ferrous sulfate 325 (65 FE)  MG EC tablet Take 325 mg by mouth daily with breakfast.     gabapentin (NEURONTIN) 400 MG capsule Take 1 capsule (400 mg total) by mouth 2 (two) times daily. 60 capsule 3   glucose blood test strip 1 each by Other route in the morning, at noon, in the evening, and at bedtime. Use as instructed qid. E11.65     Incontinence Supply Disposable (TRANQUILITY UNDERGARMENTS) MISC 1 each by Does not apply route daily. 90 each 3   insulin degludec (TRESIBA FLEXTOUCH) 200 UNIT/ML FlexTouch Pen Inject 12 Units into the skin at bedtime. 12 mL 3    Insulin Pen Needle (EASY TOUCH PEN NEEDLES) 31G X 5 MM MISC USE TO INJECT INSULIN ONCE DAILY 100 each 2   Insulin Syringe-Needle U-100 (INSULIN SYRINGE 1CC/31GX5/16") 31G X 5/16" 1 ML MISC 1 each by Does not apply route daily. Use to inject insulin daily; E11.65 90 each 2   mirtazapine (REMERON) 15 MG tablet Take 1 tablet (15 mg total) by mouth at bedtime. 30 tablet 2   Multiple Vitamin (MULTIVITAMIN) capsule Take 1 capsule by mouth daily.     NONFORMULARY OR COMPOUNDED ITEM Apply 1 each topically every 6 (six) hours as needed (perianal pain). Apothecary hemorrhoid cream. Apply rectally up to qid as directed. 1 each 0   omeprazole (PRILOSEC) 20 MG capsule TAKE ONE CAPSULE BY MOUTH EVERY DAY 30 capsule 2   QUEtiapine (SEROQUEL) 100 MG tablet Take 3 tablets at bedtime 90 tablet 2   sertraline (ZOLOFT) 100 MG tablet TAKE 1 TABLET BY MOUTH DAILY 90 tablet 2   torsemide (DEMADEX) 20 MG tablet TAKE 2 TABLETS BY MOUTH EVERY DAY and additionally TAKE 1 TABLET EVERY OTHER DAY 225 tablet 2   traZODone (DESYREL) 50 MG tablet TAKE 1/2 TABLET BY MOUTH AT BEDTIME FOR SLEEP 15 tablet 5   UNABLE TO FIND Walker x 1  DX unsteady gait, osteoarthritis of left knee 1 each 0   UNABLE TO FIND Elevated [place] Seat x 1 DX: unsteady gait, back pain, osteoarthritis of left knee 1 each 0   UNABLE TO FIND Standing upright walker x 1  DX M54.40, M19.90 1 each 0   UNABLE TO FIND Incontinence pads and supplies 1 each 1   UNABLE TO FIND Rollator Walker 1 Product 0   UNABLE TO FIND XL Tranquility Briefs for incontinence. 1 Package 3   UNABLE TO FIND Premium overnight disposible absorbant underwear XL UPC- [id number] 14 each 11   No current facility-administered medications for this visit.     REVIEW OF SYSTEMS:   Constitutional: Denies fevers, chills or night sweats Eyes: Denies blurriness of vision Ears, nose, mouth, throat, and face: Denies mucositis or sore throat Respiratory: Denies cough, dyspnea or  wheezes Cardiovascular: Denies palpitation, chest discomfort or lower extremity swelling Gastrointestinal:  Denies nausea, heartburn or change in bowel habits Skin: Denies abnormal skin rashes Lymphatics: Denies new lymphadenopathy or easy bruising Neurological:Denies numbness, tingling or new weaknesses Behavioral/Psych: Mood is stable, no new changes  All other systems were reviewed with the patient and are negative.  PHYSICAL EXAMINATION:   Vitals:   05/23/23 1256  BP: (!) 138/48  Pulse: 72  Resp: 18  Temp: 97.8 F (36.6 C)  SpO2: 98%    GENERAL:alert, no distress and comfortable SKIN: skin color, texture, turgor are normal, no rashes or significant lesions EYES: normal, Conjunctiva are pink and non-injected, sclera clear LUNGS: clear to auscultation and percussion with normal breathing effort HEART: regular  rate & rhythm and no murmurs and no lower extremity edema Musculoskeletal:no cyanosis of digits and no clubbing  NEURO: alert & oriented x 3 with fluent speech   LABORATORY DATA:  I have reviewed the data as listed and also from the nephrologist under media  Lab Results  Component Value Date   WBC 5.6 05/22/2023   NEUTROABS 2.8 05/22/2023   HGB 9.1 (L) 05/22/2023   HCT 31.4 (L) 05/22/2023   MCV 73.7 (L) 05/22/2023   PLT 232 05/22/2023      Component Value Date/Time   NA 137 02/07/2023 1431   NA 146 (H) 10/24/2021 1218   K 4.0 02/07/2023 1431   CL 97 (L) 02/07/2023 1431   CO2 30 02/07/2023 1431   GLUCOSE 123 (H) 02/07/2023 1431   BUN 33 (H) 02/07/2023 1431   BUN 42 (H) 10/24/2021 1218   CREATININE 1.42 (H) 02/07/2023 1431   CREATININE 2.02 (H) 03/22/2022 1534   CALCIUM 9.0 02/07/2023 1431   PROT 7.3 12/18/2022 1244   PROT 7.0 10/24/2021 1218   ALBUMIN 3.6 02/07/2023 1431   ALBUMIN 4.3 10/24/2021 1218   AST 45 (H) 12/18/2022 1244   ALT 51 (H) 12/18/2022 1244   ALT 16 03/22/2022 1534   ALKPHOS 87 12/18/2022 1244   BILITOT 0.7 12/18/2022 1244    BILITOT 0.2 10/24/2021 1218   GFRNONAA 38 (L) 02/07/2023 1431   GFRNONAA 40 (L) 12/01/2019 1137   GFRAA 55 (L) 06/03/2020 0210   GFRAA 46 (L) 12/01/2019 1137       Chemistry      Component Value Date/Time   NA 137 02/07/2023 1431   NA 146 (H) 10/24/2021 1218   K 4.0 02/07/2023 1431   CL 97 (L) 02/07/2023 1431   CO2 30 02/07/2023 1431   BUN 33 (H) 02/07/2023 1431   BUN 42 (H) 10/24/2021 1218   CREATININE 1.42 (H) 02/07/2023 1431   CREATININE 2.02 (H) 03/22/2022 1534      Component Value Date/Time   CALCIUM 9.0 02/07/2023 1431   ALKPHOS 87 12/18/2022 1244   AST 45 (H) 12/18/2022 1244   ALT 51 (H) 12/18/2022 1244   ALT 16 03/22/2022 1534   BILITOT 0.7 12/18/2022 1244   BILITOT 0.2 10/24/2021 1218     Lab Results  Component Value Date   IRON 63 05/22/2023   TIBC 326 05/22/2023   FERRITIN 111 05/22/2023     ASSESSMENT & PLAN:  Patient is a 82 year old female referred for anemia of chronic kidney disease  Anemia due to chronic kidney disease Patient has anemia of chronic kidney disease [stage IV, GFR 28] Hemoglobin of 9.1 from labs yesterday Iron labs within normal limits Will start Procrit every 2 weeks until hemoglobin is greater than 10 Will monitor iron labs Patient is not on p.o. iron supplementation and is difficult for her with dementia Follow-up with GI regarding colonoscopy Return to clinic in 2 weeks with labs  Chronic kidney disease, stage 4 (severe) (HCC) Patient has chronic kidney disease secondary to diabetes Stage IV with a GFR of 28  Follows with Dr. Wolfgang Phoenix   Orders Placed This Encounter  Procedures   CBC with Differential/Platelet    Standing Status:   Future    Standing Expiration Date:   05/22/2024   Ferritin    Standing Status:   Future    Standing Expiration Date:   05/22/2024   Comprehensive metabolic panel    Standing Status:   Future    Standing Expiration Date:  05/22/2024   Iron and TIBC ([facility] ONLY)     Standing Status:   Future    Standing Expiration Date:   05/22/2024    The total time spent in the appointment was 60 minutes encounter with patients including review of chart and various tests results, discussions about plan of care and coordination of care plan and documentation   All questions were answered. The patient knows to call the clinic with any problems, questions or concerns. No barriers to learning was detected.   Cindie Crumbly, MD 8/29/20241:58 PM

## 2023-05-23 NOTE — Assessment & Plan Note (Signed)
Patient has chronic kidney disease secondary to diabetes Stage IV with a GFR of 28  Follows with Dr. Wolfgang Phoenix

## 2023-05-23 NOTE — Addendum Note (Signed)
Addended byCindie Crumbly on: 05/23/2023 02:02 PM   Modules accepted: Orders

## 2023-05-24 ENCOUNTER — Other Ambulatory Visit: Payer: Self-pay

## 2023-05-24 DIAGNOSIS — Z7409 Other reduced mobility: Secondary | ICD-10-CM

## 2023-05-24 DIAGNOSIS — Z9181 History of falling: Secondary | ICD-10-CM

## 2023-05-24 NOTE — Telephone Encounter (Signed)
Referral entered  

## 2023-05-28 ENCOUNTER — Ambulatory Visit (INDEPENDENT_AMBULATORY_CARE_PROVIDER_SITE_OTHER): Payer: Medicare PPO | Admitting: Gastroenterology

## 2023-05-30 ENCOUNTER — Other Ambulatory Visit: Payer: Medicare PPO

## 2023-05-30 DIAGNOSIS — E559 Vitamin D deficiency, unspecified: Secondary | ICD-10-CM | POA: Diagnosis not present

## 2023-05-30 DIAGNOSIS — E1169 Type 2 diabetes mellitus with other specified complication: Secondary | ICD-10-CM | POA: Diagnosis not present

## 2023-05-30 DIAGNOSIS — I10 Essential (primary) hypertension: Secondary | ICD-10-CM | POA: Diagnosis not present

## 2023-05-30 DIAGNOSIS — E785 Hyperlipidemia, unspecified: Secondary | ICD-10-CM | POA: Diagnosis not present

## 2023-05-30 DIAGNOSIS — M5440 Lumbago with sciatica, unspecified side: Secondary | ICD-10-CM | POA: Diagnosis not present

## 2023-05-30 DIAGNOSIS — F03B3 Unspecified dementia, moderate, with mood disturbance: Secondary | ICD-10-CM | POA: Diagnosis not present

## 2023-05-30 DIAGNOSIS — F0394 Unspecified dementia, unspecified severity, with anxiety: Secondary | ICD-10-CM | POA: Diagnosis not present

## 2023-05-30 DIAGNOSIS — F32A Depression, unspecified: Secondary | ICD-10-CM | POA: Diagnosis not present

## 2023-05-30 DIAGNOSIS — E1149 Type 2 diabetes mellitus with other diabetic neurological complication: Secondary | ICD-10-CM | POA: Diagnosis not present

## 2023-05-31 ENCOUNTER — Ambulatory Visit (INDEPENDENT_AMBULATORY_CARE_PROVIDER_SITE_OTHER): Payer: Medicare PPO | Admitting: Podiatry

## 2023-05-31 ENCOUNTER — Telehealth: Payer: Self-pay | Admitting: Family Medicine

## 2023-05-31 DIAGNOSIS — Z91199 Patient's noncompliance with other medical treatment and regimen due to unspecified reason: Secondary | ICD-10-CM

## 2023-05-31 NOTE — Progress Notes (Signed)
No show

## 2023-05-31 NOTE — Telephone Encounter (Signed)
Stacy from [facility] PT calling needing verbal orders for  1 week 1 2 week 4 1 week 4 Please advise [phone] Thank you

## 2023-05-31 NOTE — Telephone Encounter (Signed)
Verbal order given  

## 2023-06-01 ENCOUNTER — Other Ambulatory Visit: Payer: Self-pay | Admitting: Nurse Practitioner

## 2023-06-03 DIAGNOSIS — F0394 Unspecified dementia, unspecified severity, with anxiety: Secondary | ICD-10-CM | POA: Diagnosis not present

## 2023-06-03 DIAGNOSIS — I10 Essential (primary) hypertension: Secondary | ICD-10-CM | POA: Diagnosis not present

## 2023-06-03 DIAGNOSIS — F32A Depression, unspecified: Secondary | ICD-10-CM | POA: Diagnosis not present

## 2023-06-03 DIAGNOSIS — E1149 Type 2 diabetes mellitus with other diabetic neurological complication: Secondary | ICD-10-CM | POA: Diagnosis not present

## 2023-06-03 DIAGNOSIS — E559 Vitamin D deficiency, unspecified: Secondary | ICD-10-CM | POA: Diagnosis not present

## 2023-06-03 DIAGNOSIS — E1169 Type 2 diabetes mellitus with other specified complication: Secondary | ICD-10-CM | POA: Diagnosis not present

## 2023-06-03 DIAGNOSIS — M5440 Lumbago with sciatica, unspecified side: Secondary | ICD-10-CM | POA: Diagnosis not present

## 2023-06-03 DIAGNOSIS — E785 Hyperlipidemia, unspecified: Secondary | ICD-10-CM | POA: Diagnosis not present

## 2023-06-03 DIAGNOSIS — F03B3 Unspecified dementia, moderate, with mood disturbance: Secondary | ICD-10-CM | POA: Diagnosis not present

## 2023-06-05 DIAGNOSIS — F32A Depression, unspecified: Secondary | ICD-10-CM | POA: Diagnosis not present

## 2023-06-05 DIAGNOSIS — F03B3 Unspecified dementia, moderate, with mood disturbance: Secondary | ICD-10-CM | POA: Diagnosis not present

## 2023-06-05 DIAGNOSIS — E785 Hyperlipidemia, unspecified: Secondary | ICD-10-CM | POA: Diagnosis not present

## 2023-06-05 DIAGNOSIS — I10 Essential (primary) hypertension: Secondary | ICD-10-CM | POA: Diagnosis not present

## 2023-06-05 DIAGNOSIS — E1169 Type 2 diabetes mellitus with other specified complication: Secondary | ICD-10-CM | POA: Diagnosis not present

## 2023-06-05 DIAGNOSIS — F0394 Unspecified dementia, unspecified severity, with anxiety: Secondary | ICD-10-CM | POA: Diagnosis not present

## 2023-06-05 DIAGNOSIS — M5440 Lumbago with sciatica, unspecified side: Secondary | ICD-10-CM | POA: Diagnosis not present

## 2023-06-05 DIAGNOSIS — E1149 Type 2 diabetes mellitus with other diabetic neurological complication: Secondary | ICD-10-CM | POA: Diagnosis not present

## 2023-06-05 DIAGNOSIS — E559 Vitamin D deficiency, unspecified: Secondary | ICD-10-CM | POA: Diagnosis not present

## 2023-06-06 ENCOUNTER — Ambulatory Visit: Payer: Medicare PPO

## 2023-06-06 ENCOUNTER — Ambulatory Visit: Payer: Medicare PPO | Admitting: Oncology

## 2023-06-06 ENCOUNTER — Other Ambulatory Visit: Payer: Medicare PPO

## 2023-06-10 ENCOUNTER — Inpatient Hospital Stay: Payer: Medicare PPO

## 2023-06-10 ENCOUNTER — Inpatient Hospital Stay: Payer: Medicare PPO | Admitting: Oncology

## 2023-06-10 ENCOUNTER — Inpatient Hospital Stay: Payer: Medicare PPO | Attending: Oncology

## 2023-06-10 DIAGNOSIS — F0394 Unspecified dementia, unspecified severity, with anxiety: Secondary | ICD-10-CM | POA: Diagnosis not present

## 2023-06-10 DIAGNOSIS — E559 Vitamin D deficiency, unspecified: Secondary | ICD-10-CM | POA: Diagnosis not present

## 2023-06-10 DIAGNOSIS — F32A Depression, unspecified: Secondary | ICD-10-CM | POA: Diagnosis not present

## 2023-06-10 DIAGNOSIS — E1169 Type 2 diabetes mellitus with other specified complication: Secondary | ICD-10-CM | POA: Diagnosis not present

## 2023-06-10 DIAGNOSIS — E785 Hyperlipidemia, unspecified: Secondary | ICD-10-CM | POA: Diagnosis not present

## 2023-06-10 DIAGNOSIS — I10 Essential (primary) hypertension: Secondary | ICD-10-CM | POA: Diagnosis not present

## 2023-06-10 DIAGNOSIS — E1149 Type 2 diabetes mellitus with other diabetic neurological complication: Secondary | ICD-10-CM | POA: Diagnosis not present

## 2023-06-10 DIAGNOSIS — M5440 Lumbago with sciatica, unspecified side: Secondary | ICD-10-CM | POA: Diagnosis not present

## 2023-06-10 DIAGNOSIS — F03B3 Unspecified dementia, moderate, with mood disturbance: Secondary | ICD-10-CM | POA: Diagnosis not present

## 2023-06-13 NOTE — Telephone Encounter (Signed)
Patient's daughter LVM says she needs help with finding somewhere to accept patient for 90 day rehab- she has tried multiple places and no where will accept her. Says pt dementia gets to the point where she forgets how to walk and they are unable to carry her. Requesting a call back [phone]. Please advise Thank you

## 2023-06-14 DIAGNOSIS — I10 Essential (primary) hypertension: Secondary | ICD-10-CM | POA: Diagnosis not present

## 2023-06-14 DIAGNOSIS — F32A Depression, unspecified: Secondary | ICD-10-CM | POA: Diagnosis not present

## 2023-06-14 DIAGNOSIS — E559 Vitamin D deficiency, unspecified: Secondary | ICD-10-CM | POA: Diagnosis not present

## 2023-06-14 DIAGNOSIS — E1169 Type 2 diabetes mellitus with other specified complication: Secondary | ICD-10-CM | POA: Diagnosis not present

## 2023-06-14 DIAGNOSIS — F03B3 Unspecified dementia, moderate, with mood disturbance: Secondary | ICD-10-CM | POA: Diagnosis not present

## 2023-06-14 DIAGNOSIS — E785 Hyperlipidemia, unspecified: Secondary | ICD-10-CM | POA: Diagnosis not present

## 2023-06-14 DIAGNOSIS — M5440 Lumbago with sciatica, unspecified side: Secondary | ICD-10-CM | POA: Diagnosis not present

## 2023-06-14 DIAGNOSIS — F0394 Unspecified dementia, unspecified severity, with anxiety: Secondary | ICD-10-CM | POA: Diagnosis not present

## 2023-06-14 DIAGNOSIS — E1149 Type 2 diabetes mellitus with other diabetic neurological complication: Secondary | ICD-10-CM | POA: Diagnosis not present

## 2023-06-17 ENCOUNTER — Encounter (HOSPITAL_COMMUNITY): Payer: Self-pay

## 2023-06-17 DIAGNOSIS — Z79899 Other long term (current) drug therapy: Secondary | ICD-10-CM | POA: Diagnosis not present

## 2023-06-17 DIAGNOSIS — Z880 Allergy status to penicillin: Secondary | ICD-10-CM | POA: Diagnosis not present

## 2023-06-17 DIAGNOSIS — I499 Cardiac arrhythmia, unspecified: Secondary | ICD-10-CM | POA: Diagnosis not present

## 2023-06-17 DIAGNOSIS — R569 Unspecified convulsions: Secondary | ICD-10-CM | POA: Diagnosis not present

## 2023-06-17 DIAGNOSIS — W1839XA Other fall on same level, initial encounter: Secondary | ICD-10-CM | POA: Diagnosis not present

## 2023-06-17 DIAGNOSIS — R55 Syncope and collapse: Secondary | ICD-10-CM | POA: Diagnosis not present

## 2023-06-17 DIAGNOSIS — I491 Atrial premature depolarization: Secondary | ICD-10-CM | POA: Diagnosis not present

## 2023-06-17 DIAGNOSIS — R579 Shock, unspecified: Secondary | ICD-10-CM | POA: Diagnosis not present

## 2023-06-17 DIAGNOSIS — Z7982 Long term (current) use of aspirin: Secondary | ICD-10-CM | POA: Diagnosis not present

## 2023-06-17 DIAGNOSIS — Z7902 Long term (current) use of antithrombotics/antiplatelets: Secondary | ICD-10-CM | POA: Diagnosis not present

## 2023-06-17 DIAGNOSIS — I700 Atherosclerosis of aorta: Secondary | ICD-10-CM | POA: Diagnosis not present

## 2023-06-17 DIAGNOSIS — Z7983 Long term (current) use of bisphosphonates: Secondary | ICD-10-CM | POA: Diagnosis not present

## 2023-06-17 DIAGNOSIS — R079 Chest pain, unspecified: Secondary | ICD-10-CM | POA: Diagnosis not present

## 2023-06-17 DIAGNOSIS — R531 Weakness: Secondary | ICD-10-CM | POA: Diagnosis not present

## 2023-06-17 DIAGNOSIS — R001 Bradycardia, unspecified: Secondary | ICD-10-CM | POA: Diagnosis not present

## 2023-06-18 ENCOUNTER — Other Ambulatory Visit: Payer: Self-pay

## 2023-06-18 ENCOUNTER — Inpatient Hospital Stay (HOSPITAL_COMMUNITY): Payer: Medicare PPO

## 2023-06-18 ENCOUNTER — Encounter (HOSPITAL_COMMUNITY): Payer: Self-pay | Admitting: Family Medicine

## 2023-06-18 ENCOUNTER — Inpatient Hospital Stay (HOSPITAL_COMMUNITY)
Admission: RE | Admit: 2023-06-18 | Discharge: 2023-06-22 | DRG: 308 | Disposition: A | Discharge status: Skilled Nursing Facility | Payer: Medicare PPO | Source: Other Acute Inpatient Hospital | Attending: Internal Medicine | Admitting: Internal Medicine

## 2023-06-18 DIAGNOSIS — I4581 Long QT syndrome: Secondary | ICD-10-CM | POA: Diagnosis not present

## 2023-06-18 DIAGNOSIS — Z993 Dependence on wheelchair: Secondary | ICD-10-CM | POA: Diagnosis not present

## 2023-06-18 DIAGNOSIS — N189 Chronic kidney disease, unspecified: Secondary | ICD-10-CM | POA: Diagnosis present

## 2023-06-18 DIAGNOSIS — I2510 Atherosclerotic heart disease of native coronary artery without angina pectoris: Secondary | ICD-10-CM | POA: Diagnosis present

## 2023-06-18 DIAGNOSIS — F03C11 Unspecified dementia, severe, with agitation: Secondary | ICD-10-CM

## 2023-06-18 DIAGNOSIS — F419 Anxiety disorder, unspecified: Secondary | ICD-10-CM

## 2023-06-18 DIAGNOSIS — E119 Type 2 diabetes mellitus without complications: Secondary | ICD-10-CM | POA: Diagnosis not present

## 2023-06-18 DIAGNOSIS — E1122 Type 2 diabetes mellitus with diabetic chronic kidney disease: Secondary | ICD-10-CM | POA: Diagnosis not present

## 2023-06-18 DIAGNOSIS — R55 Syncope and collapse: Secondary | ICD-10-CM | POA: Diagnosis not present

## 2023-06-18 DIAGNOSIS — Z885 Allergy status to narcotic agent status: Secondary | ICD-10-CM

## 2023-06-18 DIAGNOSIS — E1149 Type 2 diabetes mellitus with other diabetic neurological complication: Secondary | ICD-10-CM | POA: Diagnosis present

## 2023-06-18 DIAGNOSIS — R32 Unspecified urinary incontinence: Secondary | ICD-10-CM | POA: Diagnosis present

## 2023-06-18 DIAGNOSIS — F0394 Unspecified dementia, unspecified severity, with anxiety: Secondary | ICD-10-CM | POA: Diagnosis present

## 2023-06-18 DIAGNOSIS — Z79899 Other long term (current) drug therapy: Secondary | ICD-10-CM | POA: Diagnosis not present

## 2023-06-18 DIAGNOSIS — R569 Unspecified convulsions: Secondary | ICD-10-CM | POA: Diagnosis not present

## 2023-06-18 DIAGNOSIS — N1832 Chronic kidney disease, stage 3b: Secondary | ICD-10-CM | POA: Diagnosis not present

## 2023-06-18 DIAGNOSIS — B9689 Other specified bacterial agents as the cause of diseases classified elsewhere: Secondary | ICD-10-CM | POA: Diagnosis present

## 2023-06-18 DIAGNOSIS — Z89421 Acquired absence of other right toe(s): Secondary | ICD-10-CM

## 2023-06-18 DIAGNOSIS — F03918 Unspecified dementia, unspecified severity, with other behavioral disturbance: Secondary | ICD-10-CM | POA: Diagnosis not present

## 2023-06-18 DIAGNOSIS — I959 Hypotension, unspecified: Secondary | ICD-10-CM | POA: Diagnosis present

## 2023-06-18 DIAGNOSIS — Z9181 History of falling: Secondary | ICD-10-CM | POA: Diagnosis not present

## 2023-06-18 DIAGNOSIS — Z803 Family history of malignant neoplasm of breast: Secondary | ICD-10-CM

## 2023-06-18 DIAGNOSIS — I5030 Unspecified diastolic (congestive) heart failure: Secondary | ICD-10-CM | POA: Diagnosis present

## 2023-06-18 DIAGNOSIS — D563 Thalassemia minor: Secondary | ICD-10-CM | POA: Diagnosis present

## 2023-06-18 DIAGNOSIS — F32A Depression, unspecified: Secondary | ICD-10-CM | POA: Diagnosis not present

## 2023-06-18 DIAGNOSIS — I10 Essential (primary) hypertension: Secondary | ICD-10-CM | POA: Diagnosis present

## 2023-06-18 DIAGNOSIS — Z880 Allergy status to penicillin: Secondary | ICD-10-CM

## 2023-06-18 DIAGNOSIS — N179 Acute kidney failure, unspecified: Secondary | ICD-10-CM | POA: Diagnosis present

## 2023-06-18 DIAGNOSIS — Z841 Family history of disorders of kidney and ureter: Secondary | ICD-10-CM

## 2023-06-18 DIAGNOSIS — E870 Hyperosmolality and hypernatremia: Secondary | ICD-10-CM | POA: Diagnosis not present

## 2023-06-18 DIAGNOSIS — Z90710 Acquired absence of both cervix and uterus: Secondary | ICD-10-CM

## 2023-06-18 DIAGNOSIS — I509 Heart failure, unspecified: Secondary | ICD-10-CM | POA: Diagnosis not present

## 2023-06-18 DIAGNOSIS — E785 Hyperlipidemia, unspecified: Secondary | ICD-10-CM | POA: Diagnosis present

## 2023-06-18 DIAGNOSIS — F0390 Unspecified dementia without behavioral disturbance: Secondary | ICD-10-CM | POA: Diagnosis not present

## 2023-06-18 DIAGNOSIS — Z886 Allergy status to analgesic agent status: Secondary | ICD-10-CM

## 2023-06-18 DIAGNOSIS — G4700 Insomnia, unspecified: Secondary | ICD-10-CM | POA: Diagnosis present

## 2023-06-18 DIAGNOSIS — I130 Hypertensive heart and chronic kidney disease with heart failure and stage 1 through stage 4 chronic kidney disease, or unspecified chronic kidney disease: Secondary | ICD-10-CM | POA: Diagnosis not present

## 2023-06-18 DIAGNOSIS — I472 Ventricular tachycardia, unspecified: Secondary | ICD-10-CM | POA: Diagnosis not present

## 2023-06-18 DIAGNOSIS — F0393 Unspecified dementia, unspecified severity, with mood disturbance: Secondary | ICD-10-CM | POA: Diagnosis present

## 2023-06-18 DIAGNOSIS — K219 Gastro-esophageal reflux disease without esophagitis: Secondary | ICD-10-CM | POA: Diagnosis present

## 2023-06-18 DIAGNOSIS — Z7902 Long term (current) use of antithrombotics/antiplatelets: Secondary | ICD-10-CM | POA: Diagnosis not present

## 2023-06-18 DIAGNOSIS — Z801 Family history of malignant neoplasm of trachea, bronchus and lung: Secondary | ICD-10-CM

## 2023-06-18 DIAGNOSIS — F03B18 Unspecified dementia, moderate, with other behavioral disturbance: Secondary | ICD-10-CM

## 2023-06-18 DIAGNOSIS — Z7982 Long term (current) use of aspirin: Secondary | ICD-10-CM

## 2023-06-18 DIAGNOSIS — R630 Anorexia: Secondary | ICD-10-CM | POA: Diagnosis present

## 2023-06-18 DIAGNOSIS — I493 Ventricular premature depolarization: Secondary | ICD-10-CM | POA: Diagnosis present

## 2023-06-18 DIAGNOSIS — Z7401 Bed confinement status: Secondary | ICD-10-CM | POA: Diagnosis not present

## 2023-06-18 DIAGNOSIS — Z7983 Long term (current) use of bisphosphonates: Secondary | ICD-10-CM | POA: Diagnosis not present

## 2023-06-18 DIAGNOSIS — N19 Unspecified kidney failure: Secondary | ICD-10-CM | POA: Diagnosis present

## 2023-06-18 DIAGNOSIS — Z794 Long term (current) use of insulin: Secondary | ICD-10-CM

## 2023-06-18 DIAGNOSIS — I129 Hypertensive chronic kidney disease with stage 1 through stage 4 chronic kidney disease, or unspecified chronic kidney disease: Secondary | ICD-10-CM | POA: Diagnosis not present

## 2023-06-18 DIAGNOSIS — N170 Acute kidney failure with tubular necrosis: Secondary | ICD-10-CM | POA: Diagnosis not present

## 2023-06-18 DIAGNOSIS — Z833 Family history of diabetes mellitus: Secondary | ICD-10-CM

## 2023-06-18 DIAGNOSIS — I4719 Other supraventricular tachycardia: Secondary | ICD-10-CM | POA: Diagnosis not present

## 2023-06-18 DIAGNOSIS — D631 Anemia in chronic kidney disease: Secondary | ICD-10-CM | POA: Diagnosis not present

## 2023-06-18 DIAGNOSIS — Z881 Allergy status to other antibiotic agents status: Secondary | ICD-10-CM

## 2023-06-18 DIAGNOSIS — R9431 Abnormal electrocardiogram [ECG] [EKG]: Secondary | ICD-10-CM | POA: Diagnosis not present

## 2023-06-18 DIAGNOSIS — Z882 Allergy status to sulfonamides status: Secondary | ICD-10-CM

## 2023-06-18 DIAGNOSIS — E1151 Type 2 diabetes mellitus with diabetic peripheral angiopathy without gangrene: Secondary | ICD-10-CM | POA: Diagnosis present

## 2023-06-18 DIAGNOSIS — Z888 Allergy status to other drugs, medicaments and biological substances status: Secondary | ICD-10-CM

## 2023-06-18 DIAGNOSIS — E861 Hypovolemia: Secondary | ICD-10-CM | POA: Diagnosis present

## 2023-06-18 DIAGNOSIS — G4733 Obstructive sleep apnea (adult) (pediatric): Secondary | ICD-10-CM | POA: Diagnosis present

## 2023-06-18 DIAGNOSIS — R000 Tachycardia, unspecified: Secondary | ICD-10-CM | POA: Diagnosis not present

## 2023-06-18 DIAGNOSIS — N390 Urinary tract infection, site not specified: Secondary | ICD-10-CM | POA: Diagnosis present

## 2023-06-18 DIAGNOSIS — R008 Other abnormalities of heart beat: Secondary | ICD-10-CM | POA: Diagnosis present

## 2023-06-18 DIAGNOSIS — Z820 Family history of epilepsy and other diseases of the nervous system: Secondary | ICD-10-CM

## 2023-06-18 DIAGNOSIS — I462 Cardiac arrest due to underlying cardiac condition: Secondary | ICD-10-CM | POA: Diagnosis present

## 2023-06-18 DIAGNOSIS — Z9049 Acquired absence of other specified parts of digestive tract: Secondary | ICD-10-CM

## 2023-06-18 DIAGNOSIS — I499 Cardiac arrhythmia, unspecified: Secondary | ICD-10-CM | POA: Diagnosis not present

## 2023-06-18 LAB — URINALYSIS, COMPLETE (UACMP) WITH MICROSCOPIC
Bilirubin Urine: NEGATIVE
Glucose, UA: NEGATIVE mg/dL
Hgb urine dipstick: NEGATIVE
Ketones, ur: NEGATIVE mg/dL
Nitrite: NEGATIVE
Protein, ur: NEGATIVE mg/dL
Specific Gravity, Urine: 1.009 (ref 1.005–1.030)
WBC, UA: >50 WBC/hpf (ref 0–5)
pH: 5 (ref 5.0–8.0)

## 2023-06-18 LAB — BASIC METABOLIC PANEL
Anion gap: 9 (ref 5–15)
BUN: 127 mg/dL — ABNORMAL HIGH (ref 8–23)
CO2: 24 mmol/L (ref 22–32)
Calcium: 9.3 mg/dL (ref 8.9–10.3)
Chloride: 110 mmol/L (ref 98–111)
Creatinine, Ser: 3.17 mg/dL — ABNORMAL HIGH (ref 0.44–1.00)
GFR, Estimated: 14 mL/min — ABNORMAL LOW (ref >60)
Glucose, Bld: 119 mg/dL — ABNORMAL HIGH (ref 70–99)
Potassium: 4.2 mmol/L (ref 3.5–5.1)
Sodium: 143 mmol/L (ref 135–145)

## 2023-06-18 LAB — CBC
HCT: 33.1 % — ABNORMAL LOW (ref 36.0–46.0)
Hemoglobin: 9.8 g/dL — ABNORMAL LOW (ref 12.0–15.0)
MCH: 21.2 pg — ABNORMAL LOW (ref 26.0–34.0)
MCHC: 29.6 g/dL — ABNORMAL LOW (ref 30.0–36.0)
MCV: 71.6 fL — ABNORMAL LOW (ref 80.0–100.0)
Platelets: 193 10*3/uL (ref 150–400)
RBC: 4.62 MIL/uL (ref 3.87–5.11)
RDW: 17.2 % — ABNORMAL HIGH (ref 11.5–15.5)
WBC: 6.6 10*3/uL (ref 4.0–10.5)
nRBC: 0 % (ref 0.0–0.2)

## 2023-06-18 LAB — HEMOGLOBIN A1C
Hgb A1c MFr Bld: 6.5 % — ABNORMAL HIGH (ref 4.8–5.6)
Mean Plasma Glucose: 139.85 mg/dL

## 2023-06-18 LAB — PROTEIN / CREATININE RATIO, URINE
Creatinine, Urine: 32 mg/dL
Protein Creatinine Ratio: 0.75 mg/mg{Cre} — ABNORMAL HIGH (ref 0.00–0.15)
Total Protein, Urine: 24 mg/dL

## 2023-06-18 LAB — SODIUM, URINE, RANDOM: Sodium, Ur: 91 mmol/L

## 2023-06-18 LAB — PHOSPHORUS: Phosphorus: 4.1 mg/dL (ref 2.5–4.6)

## 2023-06-18 LAB — CREATININE, URINE, RANDOM: Creatinine, Urine: 32 mg/dL

## 2023-06-18 LAB — GLUCOSE, CAPILLARY: Glucose-Capillary: 143 mg/dL — ABNORMAL HIGH (ref 70–99)

## 2023-06-18 LAB — MAGNESIUM: Magnesium: 1.8 mg/dL (ref 1.7–2.4)

## 2023-06-18 MED ORDER — ASPIRIN 81 MG PO TBEC
81.0000 mg | DELAYED_RELEASE_TABLET | Freq: Every day | ORAL | Status: DC
  Administered 2023-06-19 – 2023-06-22 (×4): 81 mg via ORAL
  Filled 2023-06-18 (×4): qty 1

## 2023-06-18 MED ORDER — ACETAMINOPHEN 325 MG PO TABS: 650.0000 mg | ORAL_TABLET | Freq: Four times a day (QID) | ORAL | Status: DC | PRN

## 2023-06-18 MED ORDER — HEPARIN SODIUM (PORCINE) 5000 UNIT/ML IJ SOLN
5000.0000 [IU] | Freq: Three times a day (TID) | INTRAMUSCULAR | Status: DC
  Administered 2023-06-18 – 2023-06-22 (×11): 5000 [IU] via SUBCUTANEOUS
  Filled 2023-06-18 (×10): qty 1

## 2023-06-18 MED ORDER — ATORVASTATIN CALCIUM 40 MG PO TABS
40.0000 mg | ORAL_TABLET | Freq: Every day | ORAL | Status: DC
  Administered 2023-06-19 – 2023-06-22 (×4): 40 mg via ORAL
  Filled 2023-06-18 (×4): qty 1

## 2023-06-18 MED ORDER — SODIUM CHLORIDE 0.9% FLUSH
3.0000 mL | Freq: Two times a day (BID) | INTRAVENOUS | Status: DC
  Administered 2023-06-18 – 2023-06-20 (×3): 3 mL via INTRAVENOUS

## 2023-06-18 MED ORDER — AMIODARONE HCL IN DEXTROSE 360-4.14 MG/200ML-% IV SOLN
30.0000 mg/h | INTRAVENOUS | Status: DC
  Administered 2023-06-19 (×2): 30 mg/h via INTRAVENOUS
  Filled 2023-06-18 (×2): qty 200

## 2023-06-18 MED ORDER — SODIUM CHLORIDE 0.9 % IV SOLN: INTRAVENOUS | Status: DC

## 2023-06-18 MED ORDER — INSULIN ASPART 100 UNIT/ML IJ SOLN
0.0000 [IU] | Freq: Three times a day (TID) | INTRAMUSCULAR | Status: DC
  Administered 2023-06-19: 2 [IU] via SUBCUTANEOUS

## 2023-06-18 MED ORDER — SODIUM CHLORIDE 0.9 % IV SOLN
1.0000 g | INTRAVENOUS | Status: DC
  Administered 2023-06-18 – 2023-06-20 (×3): 1 g via INTRAVENOUS
  Filled 2023-06-18 (×3): qty 10

## 2023-06-18 MED ORDER — INSULIN ASPART 100 UNIT/ML IJ SOLN: 0.0000 [IU] | Freq: Every day | INTRAMUSCULAR | Status: DC

## 2023-06-18 MED ORDER — ACETAMINOPHEN 650 MG RE SUPP: 650.0000 mg | Freq: Four times a day (QID) | RECTAL | Status: DC | PRN

## 2023-06-18 MED ORDER — GABAPENTIN 300 MG PO CAPS
300.0000 mg | ORAL_CAPSULE | Freq: Every day | ORAL | Status: DC
  Administered 2023-06-18 – 2023-06-21 (×4): 300 mg via ORAL
  Filled 2023-06-18 (×4): qty 1

## 2023-06-18 NOTE — Consult Note (Incomplete)
Cardiology Consultation   Patient ID: RANEISHA MATHES MRN: [id number]; DOB: 04-06-1941  Admit date: (Not on file) Date of Consult: 06/18/2023  PCP:  Kerri Perches, MD   [facility] Providers Cardiologist:  Dina Rich, MD   { Click here to update MD or APP on Care Team, Refresh:1}     Patient Profile:   Laura Atkins is a 82 y.o. female with a hx of Dementia, hypertension, hyperlipidemia, CKD 3, Alpha Thalassemia who is being seen 06/18/2023 for the evaluation of syncope and reported VT arrest at the request of Dow Adolph, DO.  History of Present Illness:   Laura Atkins presented to [facility] following a syncopal episode.  Family called EMS.  En route to the hospital the patient had a VT arrest that required a shock.  She again arrested in the [facility] emergency department requiring an additional shock with no additional arrhythmic episodes following that.  Workup at that institution showed acute on chronic kidney injury with creatinine of 6, BUN of 168, and normal electrolytes including potassium and magnesium. ECG reportedly NSR with QTc 500 ms. Patient reportedly immediately back to her baseline following cardioversion in the ED.  She follows with Dr. Dina Rich for chronic leg swelling on torsemide and hypertension.  Her last visit was in November 2023 at which time no changes were made to her medication regimen.  She has a recent cardiogram from March 2024 that shows normal LV and RV function with mild to moderate TR and MR.   Past Medical History:  Diagnosis Date   Alpha thalassemia trait 01/26/2010   02/2012: Nl CBC ex H&H-10.7/34.8, MCV-69    Anemia    Anxiety    Anxiety and depression    Cellulitis 05/09/2017   Depression    Diabetes mellitus    Foot pain, right 04/30/2013   GERD (gastroesophageal reflux disease)    Headache(784.0)    Hyperlipidemia    Hypertension    Iron deficiency 01/21/2017   Microcytic anemia 01/26/2010    02/2012: Nl CBC ex H&H-10.7/34.8, MCV-69    NECK PAIN, CHRONIC 10/21/2008   +chronic back pain     Obesity    Obstructive sleep apnea    Osteoarthritis    Left knee; right shoulder; chronic neck and back pain   Pruritus    PVD (peripheral vascular disease) (HCC) 01/28/2014   Seizures (HCC)    Shoulder pain, right 04/14/2015   Tremor    This started months ago after her seizure progressing to very poor hand writing   Uncontrolled type 2 diabetes mellitus with hypoglycemia, with long-term current use of insulin (HCC) 06/12/2022   Urinary incontinence    UTI (urinary tract infection) 01/18/2013    Past Surgical History:  Procedure Laterality Date   ABDOMINAL HYSTERECTOMY     AMPUTATION TOE Right 12/20/2022   Procedure: RIGHT TOE AMPUTATION TOE;  Surgeon: Louann Sjogren, DPM;  Location: [facility] OR;  Service: Podiatry;  Laterality: Right;   BREAST EXCISIONAL BIOPSY     Left; cyst   CATARACT EXTRACTION Right    12/2017   CATARACT EXTRACTION W/ INTRAOCULAR LENS IMPLANT Left 09/07/2013   CHOLECYSTECTOMY     COLONOSCOPY     COLONOSCOPY N/A 07/20/2015   Procedure: COLONOSCOPY;  Surgeon: Malissa Hippo, MD;  Location: [facility] END[facility] SUITE;  Service: Endoscopy;  Laterality: N/A;  930   EYE SURGERY Left 09/07/2013   cataract     Home Medications:  Prior to Admission medications  Medication Sig Start Date End Date Taking? Authorizing Provider  Accu-Chek FastClix Lancets MISC 1 each by Does not apply route 4 (four) times daily. Use to monitor glucose levels 4 times daily; E11.65 04/10/22   Dani Gobble, NP  ACCU-CHEK GUIDE test strip USE TO CHECK BLOOD SUGAR FOUR TIMES DAILY 02/26/23   Dani Gobble, NP  Alcohol Swabs (B-D SINGLE USE SWABS REGULAR) PADS 1 each by Does not apply route 4 (four) times daily. Use to prep site for glucose monitoring 4 times daily; E11.65 06/12/19   Romero Belling, MD  amLODipine (NORVASC) 2.5 MG tablet Take 1 tablet (2.5 mg total) by mouth daily. 05/07/23   Kerri Perches, MD  aspirin 81 MG tablet Take 81 mg by mouth daily.    [provider]  atorvastatin (LIPITOR) 40 MG tablet TAKE 1 TABLET BY MOUTH EVERY DAY 01/25/23   Kerri Perches, MD  benazepril (LOTENSIN) 20 MG tablet TAKE 1 TABLET BY MOUTH EVERY DAY 03/18/23   Kerri Perches, MD  Blood Glucose Monitoring Suppl (ACCU-CHEK GUIDE ME) w/Device KIT 1 each by Does not apply route 4 (four) times daily. Use to monitor glucose levels 4 times daily; E11.65 06/12/19   Romero Belling, MD  buPROPion (WELLBUTRIN XL) 150 MG 24 hr tablet TAKE 1 TABLET BY MOUTH EVERY MORNING 09/30/22   Myrlene Broker, MD  Cholecalciferol 125 MCG (5000 UT) TABS Take 5,000 Units by mouth daily.    [provider]  Cyanocobalamin (VITAMIN B-12) 5000 MCG TBDP Take 5,000 mcg by mouth daily.    [provider]  diphenhydrAMINE (BENADRYL) 25 MG tablet Take 25 mg by mouth at bedtime.    [provider]  donepezil (ARICEPT) 10 MG tablet Take 1 tablet (10 mg total) by mouth at bedtime. 11/14/22   Kerri Perches, MD  ferrous sulfate 325 (65 FE) MG EC tablet Take 325 mg by mouth daily with breakfast.    [provider]  gabapentin (NEURONTIN) 400 MG capsule Take 1 capsule (400 mg total) by mouth 2 (two) times daily. 05/07/23   Kerri Perches, MD  glucose blood test strip 1 each by Other route in the morning, at noon, in the evening, and at bedtime. Use as instructed qid. E11.65    [provider]  Incontinence Supply Disposable (TRANQUILITY UNDERGARMENTS) MISC 1 each by Does not apply route daily. 10/29/22   Dolores Frame, MD  insulin degludec (TRESIBA FLEXTOUCH) 200 UNIT/ML FlexTouch Pen Inject 12 Units into the skin at bedtime. 05/10/22   Dani Gobble, NP  Insulin Pen Needle (EASY TOUCH PEN NEEDLES) 31G X 5 MM MISC USE TO INJECT INSULIN ONCE DAILY 08/14/22   Dani Gobble, NP  Insulin Syringe-Needle U-100 (INSULIN SYRINGE 1CC/31GX5/16") 31G X 5/16" 1 ML  MISC 1 each by Does not apply route daily. Use to inject insulin daily; E11.65 06/12/19   Romero Belling, MD  mirtazapine (REMERON) 15 MG tablet Take 1 tablet (15 mg total) by mouth at bedtime. 05/07/23   Kerri Perches, MD  Multiple Vitamin (MULTIVITAMIN) capsule Take 1 capsule by mouth daily.    [provider]  NONFORMULARY OR COMPOUNDED ITEM Apply 1 each topically every 6 (six) hours as needed (perianal pain). Apothecary hemorrhoid cream. Apply rectally up to qid as directed. 02/04/23   Dolores Frame, MD  omeprazole (PRILOSEC) 20 MG capsule TAKE ONE CAPSULE BY MOUTH EVERY DAY 01/25/23   Kerri Perches, MD  QUEtiapine (SEROQUEL)  100 MG tablet Take 3 tablets at bedtime 05/07/23   Kerri Perches, MD  sertraline (ZOLOFT) 100 MG tablet TAKE 1 TABLET BY MOUTH DAILY 07/30/22   Myrlene Broker, MD  torsemide (DEMADEX) 20 MG tablet TAKE 2 TABLETS BY MOUTH EVERY DAY and additionally TAKE 1 TABLET EVERY OTHER DAY 03/28/22   Antoine Poche, MD  traZODone (DESYREL) 50 MG tablet TAKE 1/2 TABLET BY MOUTH AT BEDTIME FOR SLEEP 03/18/23   Kerri Perches, MD  UNABLE TO FIND Walker x 1  DX unsteady gait, osteoarthritis of left knee 02/28/17   Kerri Perches, MD  UNABLE TO FIND Elevated Toliet Seat x 1 DX: unsteady gait, back pain, osteoarthritis of left knee 02/28/17   Kerri Perches, MD  UNABLE TO FIND Standing upright walker x 1  DX M54.40, M19.90 03/01/20   Kerri Perches, MD  UNABLE TO FIND Incontinence pads and supplies 10/18/20   Heather Roberts, NP  UNABLE TO FIND Rollator Dan Humphreys 12/26/20   Kerri Perches, MD  UNABLE TO FIND XL Tranquility Briefs for incontinence. 12/26/20   Kerri Perches, MD  UNABLE TO FIND Premium overnight disposible absorbant underwear XL UPC- [id number] 06/22/22   Kerri Perches, MD    Inpatient Medications: Scheduled Meds:  Continuous Infusions:  PRN Meds:   Allergies:    Allergies  Allergen Reactions   Penicillins  Shortness Of Breath, Itching and Rash   Prednisone Shortness Of Breath, Itching and Rash   Propoxyphene N-Acetaminophen Itching and Nausea And Vomiting    Darvocet    Gentamicin Rash    Cream   Sulfa Antibiotics Itching and Nausea And Vomiting    Social History:   Social History   Socioeconomic History   Marital status: Married    Spouse name: saunders   Number of children: 5   Years of education: 12   Highest education level: Not on file  Occupational History   Occupation: Disabled     Employer: RETIRED  Tobacco Use   Smoking status: Never    Passive exposure: Past   Smokeless tobacco: Never  Vaping Use   Vaping status: Never Used  Substance and Sexual Activity   Alcohol use: No    Alcohol/week: 0.0 standard drinks of alcohol   Drug use: No   Sexual activity: Yes    Birth control/protection: Surgical  Other Topics Concern   Not on file  Social History Narrative   07/27/20 Patient lives at home with her husband Shara Blazing) and dgtr- Jasmine December.  Patient is retired.    Right handed.    Five Children.    Caffeine- 2 daily   Social Determinants of Health   Financial Resource Strain: Low Risk  (04/01/2023)   Received from [facility]   Overall Financial Resource Strain (CARDIA)    Difficulty of Paying Living Expenses: Not hard at all  Food Insecurity: No Food Insecurity (04/01/2023)   [facility]   Hunger Vital Sign    Worried About Running Out of Food in the Last Year: Never true    Ran Out of Food in the Last Year: Never true  Transportation Needs: No Transportation Needs (04/01/2023)   Received from [facility] - Transportation    Lack of Transportation (Medical): No    Lack of Transportation (Non-Medical): No  Physical Activity: Inactive (04/18/2022)   Exercise Vital Sign    Days of Exercise per Week: 0 days  Minutes of Exercise per Session: 0 min  Stress: No Stress Concern Present (04/18/2022)   [employer] of Occupational  Health - Occupational Stress Questionnaire    Feeling of Stress : Not at all  Social Connections: Moderately Integrated (04/18/2022)   Social Connection and Isolation Panel [NHANES]    Frequency of Communication with Friends and Family: More than three times a week    Frequency of Social Gatherings with Friends and Family: More than three times a week    Attends Religious Services: More than 4 times per year    Active Member of [employer] or Organizations: No    Attends Banker Meetings: Never    Marital Status: Married  Catering manager Violence: Not At Risk (12/18/2022)   Humiliation, Afraid, Rape, and Kick questionnaire    Fear of Current or Ex-Partner: No    Emotionally Abused: No    Physically Abused: No    Sexually Abused: No    Family History:    Family History  Problem Relation Age of Onset   Lung cancer Mother 53   Kidney disease Father    Diabetes Sister    Keloids Brother    ADD / ADHD Grandchild    Bipolar disorder Grandchild    Bipolar disorder Daughter    Seizures Daughter    Heart disease Daughter    Kidney disease Son    Neuropathy Son    Kidney disease Son    Edema Daughter    Breast cancer Daughter 66   Allergies Daughter    Alcohol abuse Neg Hx    Drug abuse Neg Hx      ROS:  Please see the history of present illness.   All other ROS reviewed and negative.     Physical Exam/Data:  There were no vitals filed for this visit. No intake or output data in the 24 hours ending 06/18/23 0031    05/23/2023   12:56 PM 05/07/2023    1:39 PM 01/10/2023    1:56 PM  Last 3 Weights  Weight (lbs) -- 227 lb 227 lb  Weight (kg) -- 102.967 kg 102.967 kg     There is no height or weight on file to calculate BMI.  General: Elderly female, no acute distress HEENT: normal Neck: no JVD Vascular: No carotid bruits; Distal pulses 2+ bilaterally Cardiac:  normal S1, S2; RRR; no murmur  Lungs:  clear to auscultation bilaterally, no wheezing, rhonchi or rales   Abd: soft, nontender, no hepatomegaly  Ext: no edema Musculoskeletal:  No deformities, BUE and BLE strength normal and equal Skin: warm and dry  Neuro:  CNs 2-12 intact, no focal abnormalities noted Psych:  Normal affect   EKG:  The EKG was personally reviewed and demonstrates:  *** Telemetry:  Telemetry was personally reviewed and demonstrates:  ***  Relevant CV Studies: Echo 11/2022   1. Left ventricular ejection fraction, by estimation, is 50 to 55%. The  left ventricle has low normal function. The left ventricle has no regional  wall motion abnormalities. Left ventricular diastolic parameters are  consistent with Grade II diastolic  dysfunction (pseudonormalization).   2. Right ventricular systolic function is normal. The right ventricular  size is mildly enlarged.   3. Left atrial size was mildly dilated.   4. The mitral valve is normal in structure. Mild to moderate mitral valve  regurgitation. No evidence of mitral stenosis.   5. Tricuspid valve regurgitation is mild to moderate.   6. The aortic valve is  normal in structure. Aortic valve regurgitation is  mild. No aortic stenosis is present.   7. The inferior vena cava is normal in size with greater than 50%  respiratory variability, suggesting right atrial pressure of 3 mmHg.   Laboratory Data:  High Sensitivity Troponin:  No results for input(s): "TROPONINIHS" in the last 720 hours.   ChemistryNo results for input(s): "NA", "K", "CL", "CO2", "GLUCOSE", "BUN", "CREATININE", "CALCIUM", "MG", "GFRNONAA", "GFRAA", "ANIONGAP" in the last 168 hours.  No results for input(s): "PROT", "ALBUMIN", "AST", "ALT", "ALKPHOS", "BILITOT" in the last 168 hours. Lipids No results for input(s): "CHOL", "TRIG", "HDL", "LABVLDL", "LDLCALC", "CHOLHDL" in the last 168 hours.  HematologyNo results for input(s): "WBC", "RBC", "HGB", "HCT", "MCV", "MCH", "MCHC", "RDW", "PLT" in the last 168 hours. Thyroid No results for input(s): "TSH", "FREET4"  in the last 168 hours.  BNPNo results for input(s): "BNP", "PROBNP" in the last 168 hours.  DDimer No results for input(s): "DDIMER" in the last 168 hours.   Radiology/Studies:  No results found.   Assessment and Plan:   Cardiac arrest - Recent echo with no significant structural heart disease.  I suspect that her arrests were related to severe uremia which is a known cause of cardiac arrest.  Her electrolytes are within normal limits.  QTc at outside facility moderately long. We will treat her with amiodarone until her uremia has cleared with dialysis. -Echocardiogram -Avoid QT prolonging agents   Risk Assessment/Risk Scores:  {Complete the following score calculators/questions to meet required metrics.  Press F2         :[id number]}      For questions or updates, please contact [facility] Please consult [email or web address] for contact info under   Signed, Roderic Palau, MD  06/18/2023 12:31 AM

## 2023-06-18 NOTE — Progress Notes (Signed)
Received call from hospitalist re: pt being admitted with AKI  Pt is 34F with dementia, wheelchair bound mostly, anemia, CKD p/w syncopal episode, hypotension to 50s, then unstable Vtach requiring cardioversion x2.  Initially BUN 168, Cr 6.01 from baseline of 1.4 01/2023.  K and CO2 OK  Given fluids, urine looked infected, now on Rocephin.  Repeat labs show BUN 127, Cr 3.17  Making urine.    Recs for now: - continue IVFs - strict I/O - agree with antibiotics - sending for RUS, UP/C, SPEP/ free light chains - very poor candidate it appears for dialysis- if we can get her by without dialytic intervention that would be optimal - no hard indication for RRT either at this time since labs are improving and she is making urine per reports  Full c/s to follow  Bufford Buttner MD [facility] Pgr [phone]

## 2023-06-18 NOTE — Progress Notes (Signed)
Pt arrived from [facility] with foley catheter.  Pt's daughter/POA, states that [name]' U/A was positive for a UTI, and the daughter is requesting that the foley be removed.  Dr. Antionette Char was notified, and he agreed that the foley could be discontinued.  Urine was collected from the foley for labs, and then the foley was discontinued without difficulty.  700 mL of cloudy, yellow urine was emptied from foley bag.  Pt's daughters placed a diaper on her per their preference and home routine.  Laura Atkins

## 2023-06-18 NOTE — H&P (Signed)
History and Physical    [name]:[id number] DOB: [birth year] DOA: 06/18/2023  PCP: Kerri Perches, MD   Patient coming from: Home   Chief Complaint: LOC   HPI: Laura Atkins is a 82 y.o. female with medical history significant for dementia, depression, anxiety, insomnia, insulin-dependent diabetes mellitus, hypertension, and CKD 3B who presented to [facility] emergency department on 06/17/2023 after loss of consciousness at home.  Patient was noted by family to have a sudden loss of consciousness yesterday, was found by cardiology to have SBP in the 50s, heart rate slowed to 28 bpm, and she went into ventricular tachycardia.  She converted to sinus rhythm after 1 shock with EMS prior to arrival in the ED.   Patient is unable to contribute much to the history due to her underlying dementia.  Family was concerned that she seemed to be getting sick and suspected she had a urinary tract infection.  [facility] ED Course: In the ED, patient went into VT arrest.  She recovered with 1 shock in the ED and is said to quickly return to her baseline.  Labs were most notable for BUN 168, creatinine 6.01, normal troponin x 2, proBNP 500, and UA with bacteriuria, pyuria, and positive nitrites.  Cardiology and nephrology at [facility] were consulted, patient was given IV fluids and IV amiodarone, and transferred to [facility] for admission.  Review of Systems:  All other systems reviewed and apart from HPI, are negative.  Past Medical History:  Diagnosis Date   Alpha thalassemia trait 01/26/2010   02/2012: Nl CBC ex H&H-10.7/34.8, MCV-69    Anemia    Anxiety    Anxiety and depression    Cellulitis 05/09/2017   Depression    Diabetes mellitus    Foot pain, right 04/30/2013   GERD (gastroesophageal reflux disease)    Headache(784.0)    Hyperlipidemia    Hypertension    Iron deficiency 01/21/2017   Microcytic anemia 01/26/2010   02/2012: Nl CBC ex  H&H-10.7/34.8, MCV-69    NECK PAIN, CHRONIC 10/21/2008   +chronic back pain     Obesity    Obstructive sleep apnea    Osteoarthritis    Left knee; right shoulder; chronic neck and back pain   Pruritus    PVD (peripheral vascular disease) (HCC) 01/28/2014   Seizures (HCC)    Shoulder pain, right 04/14/2015   Tremor    This started months ago after her seizure progressing to very poor hand writing   Uncontrolled type 2 diabetes mellitus with hypoglycemia, with long-term current use of insulin (HCC) 06/12/2022   Urinary incontinence    UTI (urinary tract infection) 01/18/2013    Past Surgical History:  Procedure Laterality Date   ABDOMINAL HYSTERECTOMY     AMPUTATION TOE Right 12/20/2022   Procedure: RIGHT TOE AMPUTATION TOE;  Surgeon: Louann Sjogren, DPM;  Location: [facility] OR;  Service: Podiatry;  Laterality: Right;   BREAST EXCISIONAL BIOPSY     Left; cyst   CATARACT EXTRACTION Right    12/2017   CATARACT EXTRACTION W/ INTRAOCULAR LENS IMPLANT Left 09/07/2013   CHOLECYSTECTOMY     COLONOSCOPY     COLONOSCOPY N/A 07/20/2015   Procedure: COLONOSCOPY;  Surgeon: Malissa Hippo, MD;  Location: [facility] ENDO SUITE;  Service: Endoscopy;  Laterality: N/A;  930   EYE SURGERY Left 09/07/2013   cataract    Social History:   reports that she has never smoked. She has been exposed to tobacco smoke.  She has never used smokeless tobacco. She reports that she does not drink alcohol and does not use drugs.  Allergies  Allergen Reactions   Penicillins Shortness Of Breath, Itching and Rash   Prednisone Shortness Of Breath, Itching and Rash   Propoxyphene N-Acetaminophen Itching and Nausea And Vomiting    Darvocet    Gentamicin Rash    Cream   Sulfa Antibiotics Itching and Nausea And Vomiting    Family History  Problem Relation Age of Onset   Lung cancer Mother 62   Kidney disease Father    Diabetes Sister    Keloids Brother    ADD / ADHD Grandchild    Bipolar disorder Grandchild     Bipolar disorder Daughter    Seizures Daughter    Heart disease Daughter    Kidney disease Son    Neuropathy Son    Kidney disease Son    Edema Daughter    Breast cancer Daughter 38   Allergies Daughter    Alcohol abuse Neg Hx    Drug abuse Neg Hx      Prior to Admission medications   Medication Sig Start Date End Date Taking? Authorizing Provider  Accu-Chek FastClix Lancets MISC 1 each by Does not apply route 4 (four) times daily. Use to monitor glucose levels 4 times daily; E11.65 04/10/22   Dani Gobble, NP  ACCU-CHEK GUIDE test strip USE TO CHECK BLOOD SUGAR FOUR TIMES DAILY 02/26/23   Dani Gobble, NP  Alcohol Swabs (B-D SINGLE USE SWABS REGULAR) PADS 1 each by Does not apply route 4 (four) times daily. Use to prep site for glucose monitoring 4 times daily; E11.65 06/12/19   Romero Belling, MD  amLODipine (NORVASC) 2.5 MG tablet Take 1 tablet (2.5 mg total) by mouth daily. 05/07/23   Kerri Perches, MD  aspirin 81 MG tablet Take 81 mg by mouth daily.    [provider]  atorvastatin (LIPITOR) 40 MG tablet TAKE 1 TABLET BY MOUTH EVERY DAY 01/25/23   Kerri Perches, MD  benazepril (LOTENSIN) 20 MG tablet TAKE 1 TABLET BY MOUTH EVERY DAY 03/18/23   Kerri Perches, MD  Blood Glucose Monitoring Suppl (ACCU-CHEK GUIDE ME) w/Device KIT 1 each by Does not apply route 4 (four) times daily. Use to monitor glucose levels 4 times daily; E11.65 06/12/19   Romero Belling, MD  buPROPion (WELLBUTRIN XL) 150 MG 24 hr tablet TAKE 1 TABLET BY MOUTH EVERY MORNING 09/30/22   Myrlene Broker, MD  Cholecalciferol 125 MCG (5000 UT) TABS Take 5,000 Units by mouth daily.    [provider]  Cyanocobalamin (VITAMIN B-12) 5000 MCG TBDP Take 5,000 mcg by mouth daily.    [provider]  diphenhydrAMINE (BENADRYL) 25 MG tablet Take 25 mg by mouth at bedtime.    [provider]  donepezil (ARICEPT) 10 MG tablet Take 1 tablet (10 mg total) by mouth at bedtime.  11/14/22   Kerri Perches, MD  ferrous sulfate 325 (65 FE) MG EC tablet Take 325 mg by mouth daily with breakfast.    [provider]  gabapentin (NEURONTIN) 400 MG capsule Take 1 capsule (400 mg total) by mouth 2 (two) times daily. 05/07/23   Kerri Perches, MD  glucose blood test strip 1 each by Other route in the morning, at noon, in the evening, and at bedtime. Use as instructed qid. E11.65    [provider]  Incontinence Supply Disposable (TRANQUILITY UNDERGARMENTS) MISC 1 each  by Does not apply route daily. 10/29/22   Dolores Frame, MD  insulin degludec (TRESIBA FLEXTOUCH) 200 UNIT/ML FlexTouch Pen Inject 12 Units into the skin at bedtime. 05/10/22   Dani Gobble, NP  Insulin Pen Needle (EASY TOUCH PEN NEEDLES) 31G X 5 MM MISC USE TO INJECT INSULIN ONCE DAILY 08/14/22   Dani Gobble, NP  Insulin Syringe-Needle U-100 (INSULIN SYRINGE 1CC/31GX5/16") 31G X 5/16" 1 ML MISC 1 each by Does not apply route daily. Use to inject insulin daily; E11.65 06/12/19   Romero Belling, MD  mirtazapine (REMERON) 15 MG tablet Take 1 tablet (15 mg total) by mouth at bedtime. 05/07/23   Kerri Perches, MD  Multiple Vitamin (MULTIVITAMIN) capsule Take 1 capsule by mouth daily.    [provider]  NONFORMULARY OR COMPOUNDED ITEM Apply 1 each topically every 6 (six) hours as needed (perianal pain). Apothecary hemorrhoid cream. Apply rectally up to qid as directed. 02/04/23   Dolores Frame, MD  omeprazole (PRILOSEC) 20 MG capsule TAKE ONE CAPSULE BY MOUTH EVERY DAY 01/25/23   Kerri Perches, MD  QUEtiapine (SEROQUEL) 100 MG tablet Take 3 tablets at bedtime 05/07/23   Kerri Perches, MD  sertraline (ZOLOFT) 100 MG tablet TAKE 1 TABLET BY MOUTH DAILY 07/30/22   Myrlene Broker, MD  torsemide (DEMADEX) 20 MG tablet TAKE 2 TABLETS BY MOUTH EVERY DAY and additionally TAKE 1 TABLET EVERY OTHER DAY 03/28/22   Antoine Poche, MD  traZODone (DESYREL) 50  MG tablet TAKE 1/2 TABLET BY MOUTH AT BEDTIME FOR SLEEP 03/18/23   Kerri Perches, MD  UNABLE TO FIND Walker x 1  DX unsteady gait, osteoarthritis of left knee 02/28/17   Kerri Perches, MD  UNABLE TO FIND Elevated Toliet Seat x 1 DX: unsteady gait, back pain, osteoarthritis of left knee 02/28/17   Kerri Perches, MD  UNABLE TO FIND Standing upright walker x 1  DX M54.40, M19.90 03/01/20   Kerri Perches, MD  UNABLE TO FIND Incontinence pads and supplies 10/18/20   Heather Roberts, NP  UNABLE TO FIND Rollator Dan Humphreys 12/26/20   Kerri Perches, MD  UNABLE TO FIND XL Tranquility Briefs for incontinence. 12/26/20   Kerri Perches, MD  UNABLE TO FIND Premium overnight disposible absorbant underwear XL UPC- [id number] 06/22/22   Kerri Perches, MD    Physical Exam: Vitals:   06/18/23 1957  BP: 113/60  Pulse: 70  Resp: 17  Temp: 98.2 F (36.8 C)  TempSrc: Oral  SpO2: 99%    Constitutional: NAD, no pallor or diaphoresis   Eyes: PERTLA, lids and conjunctivae normal ENMT: Mucous membranes are moist. Posterior pharynx clear of any exudate or lesions.   Neck: supple, no masses  Respiratory: no wheezing, no crackles. No accessory muscle use.  Cardiovascular: S1 & S2 heard, regular rate and rhythm. No extremity edema.   Abdomen: No distension, no tenderness, soft. Bowel sounds active.  Musculoskeletal: no clubbing / cyanosis. No joint deformity upper and lower extremities.   Skin: no significant rashes, lesions, ulcers. Warm, dry, well-perfused. Neurologic: CN 2-12 grossly intact. Moving all extremities. Alert and oriented x2.  Psychiatric: Calm. Cooperative.    Labs and Imaging on Admission: I have personally reviewed following labs and imaging studies  CBC: Recent Labs  Lab 06/18/23 2031  WBC 6.6  HGB 9.8*  HCT 33.1*  MCV 71.6*  PLT 193   Basic Metabolic Panel: Recent Labs  Lab 06/18/23  2031  NA 143  K 4.2  CL 110  CO2 24  GLUCOSE 119*  BUN 127*   CREATININE 3.17*  CALCIUM 9.3  MG 1.8  PHOS 4.1   GFR: CrCl cannot be calculated (Unknown ideal weight.). Liver Function Tests: No results for input(s): "AST", "ALT", "ALKPHOS", "BILITOT", "PROT", "ALBUMIN" in the last 168 hours. No results for input(s): "LIPASE", "AMYLASE" in the last 168 hours. No results for input(s): "AMMONIA" in the last 168 hours. Coagulation Profile: No results for input(s): "INR", "PROTIME" in the last 168 hours. Cardiac Enzymes: No results for input(s): "CKTOTAL", "CKMB", "CKMBINDEX", "TROPONINI" in the last 168 hours. BNP (last 3 results) No results for input(s): "PROBNP" in the last 8760 hours. HbA1C: Recent Labs    06/18/23 2031  HGBA1C 6.5*   CBG: Recent Labs  Lab 06/18/23 2213  GLUCAP 143*   Lipid Profile: No results for input(s): "CHOL", "HDL", "LDLCALC", "TRIG", "CHOLHDL", "LDLDIRECT" in the last 72 hours. Thyroid Function Tests: No results for input(s): "TSH", "T4TOTAL", "FREET4", "T3FREE", "THYROIDAB" in the last 72 hours. Anemia Panel: No results for input(s): "VITAMINB12", "FOLATE", "FERRITIN", "TIBC", "IRON", "RETICCTPCT" in the last 72 hours. Urine analysis:    Component Value Date/Time   COLORURINE YELLOW 06/18/2023 2100   APPEARANCEUR CLOUDY (A) 06/18/2023 2100   APPEARANCEUR Clear 04/02/2022 1454   LABSPEC 1.009 06/18/2023 2100   PHURINE 5.0 06/18/2023 2100   GLUCOSEU NEGATIVE 06/18/2023 2100   GLUCOSEU 500 (A) 08/12/2017 1132   HGBUR NEGATIVE 06/18/2023 2100   BILIRUBINUR NEGATIVE 06/18/2023 2100   BILIRUBINUR Negative 04/02/2022 1454   KETONESUR NEGATIVE 06/18/2023 2100   PROTEINUR NEGATIVE 06/18/2023 2100   UROBILINOGEN 0.2 05/18/2021 1645   UROBILINOGEN 0.2 08/12/2017 1132   NITRITE NEGATIVE 06/18/2023 2100   LEUKOCYTESUR LARGE (A) 06/18/2023 2100   Sepsis Labs: @LABRCNTIP (procalcitonin:4,lacticidven:4) )No results found for this or any previous visit (from the past 240 hour(s)).   Radiological Exams on  Admission: No results found.  EKG: Independently reviewed. Sinus rhythm, PVCs, non-specific IVCD, QTc 506.   Assessment/Plan   1. Ventricular tachycardia  - Shocked to SR with EMS and again in ED  - Appreciate cardiology assessment, suspected secondary to uremia  - Continue cardiac monitoring, optimize electrolytes, continue amiodarone infusion, check echocardiogram, avoid QT-prolonging medications    2. AKI superimposed on CKD 3B  - BUN was 168 and SCr 6.01 in ED on 9/23, improved to 127 and 3.17 tonight  - She is urinating and potassium and bicarbonate are normal  - Appreciate Dr. Signe Colt of nephrology reviewing case and providing recommendations  - Continue IVF hydration, hold torsemide and benazepril, check renal US, SPEP, light chains, urine protein/creatinie ratio, renally-dose medications, repeat serum chemistries in am    3. UTI  - Not septic on admission  - Treat with Rocephin, follow culture   4. Dementia, depression, anxiety, insomnia  - Use delirium precautions, avoid QT-prolonging medications   5. Hypertension  - Hold ACE-i, treat as-needed only for now    6. Insulin-dependent DM  - A1c was 6.7% in July 2024  - Check CBGs and use low-intensity SSI    DVT prophylaxis: sq heparin  Code Status: Full  Level of Care: Level of care: Progressive Family Communication: older daughter at bedside; younger daughter Melina Modena) updated by phone  Disposition Plan:  Patient is from: Home  Anticipated d/c is to: TBD Anticipated d/c date is: 06/22/23  Patient currently: Pending improved renal function, stable heart rhythm  Consults called: Cardiology, nephrology  Admission status:  Inpatient     Briscoe Deutscher, MD [facility]  06/18/2023, 10:40 PM

## 2023-06-19 ENCOUNTER — Other Ambulatory Visit: Payer: Self-pay

## 2023-06-19 ENCOUNTER — Inpatient Hospital Stay (HOSPITAL_COMMUNITY): Payer: Medicare PPO

## 2023-06-19 ENCOUNTER — Encounter: Payer: Self-pay | Admitting: Family Medicine

## 2023-06-19 DIAGNOSIS — I10 Essential (primary) hypertension: Secondary | ICD-10-CM

## 2023-06-19 DIAGNOSIS — I472 Ventricular tachycardia, unspecified: Secondary | ICD-10-CM | POA: Diagnosis not present

## 2023-06-19 LAB — CBC
HCT: 29.3 % — ABNORMAL LOW (ref 36.0–46.0)
Hemoglobin: 9 g/dL — ABNORMAL LOW (ref 12.0–15.0)
MCH: 22.2 pg — ABNORMAL LOW (ref 26.0–34.0)
MCHC: 30.7 g/dL (ref 30.0–36.0)
MCV: 72.3 fL — ABNORMAL LOW (ref 80.0–100.0)
Platelets: 168 10*3/uL (ref 150–400)
RBC: 4.05 MIL/uL (ref 3.87–5.11)
RDW: 17.2 % — ABNORMAL HIGH (ref 11.5–15.5)
WBC: 6 10*3/uL (ref 4.0–10.5)
nRBC: 0 % (ref 0.0–0.2)

## 2023-06-19 LAB — GLUCOSE, CAPILLARY
Glucose-Capillary: 117 mg/dL — ABNORMAL HIGH (ref 70–99)
Glucose-Capillary: 234 mg/dL — ABNORMAL HIGH (ref 70–99)
Glucose-Capillary: 66 mg/dL — ABNORMAL LOW (ref 70–99)
Glucose-Capillary: 75 mg/dL (ref 70–99)
Glucose-Capillary: 109 mg/dL — ABNORMAL HIGH (ref 70–99)

## 2023-06-19 LAB — ECHOCARDIOGRAM COMPLETE
AR max vel: 2.76 cm2
AV Area VTI: 3.94 cm2
AV Area mean vel: 3.1 cm2
AV Mean grad: 3 mmHg
AV Peak grad: 5.4 mmHg
Ao pk vel: 1.16 m/s
Area-P 1/2: 2.46 cm2
S' Lateral: 3.8 cm
Weight: 3174.62 oz

## 2023-06-19 LAB — BASIC METABOLIC PANEL
Anion gap: 15 (ref 5–15)
BUN: 112 mg/dL — ABNORMAL HIGH (ref 8–23)
CO2: 23 mmol/L (ref 22–32)
Calcium: 9.5 mg/dL (ref 8.9–10.3)
Chloride: 110 mmol/L (ref 98–111)
Creatinine, Ser: 2.83 mg/dL — ABNORMAL HIGH (ref 0.44–1.00)
GFR, Estimated: 16 mL/min — ABNORMAL LOW (ref >60)
Glucose, Bld: 119 mg/dL — ABNORMAL HIGH (ref 70–99)
Potassium: 4.3 mmol/L (ref 3.5–5.1)
Sodium: 148 mmol/L — ABNORMAL HIGH (ref 135–145)

## 2023-06-19 MED ORDER — BUPROPION HCL ER (XL) 150 MG PO TB24
150.0000 mg | ORAL_TABLET | Freq: Every morning | ORAL | Status: DC
  Administered 2023-06-19 – 2023-06-22 (×4): 150 mg via ORAL
  Filled 2023-06-19 (×4): qty 1

## 2023-06-19 MED ORDER — AMIODARONE HCL 200 MG PO TABS
200.0000 mg | ORAL_TABLET | Freq: Every day | ORAL | Status: DC
  Administered 2023-06-19 – 2023-06-22 (×4): 200 mg via ORAL
  Filled 2023-06-19 (×4): qty 1

## 2023-06-19 MED ORDER — PERFLUTREN LIPID MICROSPHERE
1.0000 mL | INTRAVENOUS | Status: AC | PRN
  Administered 2023-06-19: 2 mL via INTRAVENOUS

## 2023-06-19 MED ORDER — SODIUM CHLORIDE 0.45 % IV SOLN: INTRAVENOUS | Status: DC

## 2023-06-19 MED ORDER — VITAMIN B-12 1000 MCG PO TABS
5000.0000 ug | ORAL_TABLET | Freq: Every day | ORAL | Status: DC
  Administered 2023-06-19 – 2023-06-21 (×3): 5000 ug via ORAL
  Filled 2023-06-19 (×6): qty 5

## 2023-06-19 MED ORDER — ACETAMINOPHEN 325 MG PO TABS
650.0000 mg | ORAL_TABLET | Freq: Four times a day (QID) | ORAL | Status: DC | PRN
  Administered 2023-06-20 – 2023-06-21 (×2): 650 mg via ORAL
  Filled 2023-06-19 (×2): qty 2

## 2023-06-19 NOTE — Plan of Care (Signed)

## 2023-06-19 NOTE — Consult Note (Addendum)
Cardiology Consultation   Patient ID: Laura Atkins MRN: [id number]; DOB: 11-27-1940  Admit date: 06/18/2023 Date of Consult: 06/19/2023  PCP:  Kerri Perches, MD   [facility] Providers Cardiologist:  Dina Rich, MD    Patient Profile:   Laura Atkins is a 82 y.o. female with a hx of dementia, depression, anxiety, insomnia, insulin-dependent diabetes, HTN, CKD stage IIIb who is being seen 06/19/2023 for the evaluation of VT at the request of Dr. Sharon Seller.  History of Present Illness:   Laura Atkins is a 81 year old female with above medical history who is followed by Dr. Wyline Mood. Per chart review, echocardiogram 02/17/13 showed EF 60-65%, no regional wall motion abnormalities. Nuclear stress test 02/18/23 was probably negative (there was a small and mild defect that likely represents breast attenuation). Underwent carotid ultrasounds in 04/2016 that showed mild bilateral blockages.   Patient has been followed by Dr. Wyline Mood for lower extremity edema, HTN, HLD. She is followed by hematology for alpha thalassemia. She was last seen by cardiology on 08/22/22. At that time, patient's ankle edema was well controlled on torsemide 60 mg daily. BP was a bit elevated, so amlodipine was increased.   Patient was admitted from 3/26-12/24/22 with acute osteomyelitis of the right great toe. Echocardiogram 12/19/22 showed EF 50-55%, no regional wall motion abnormalities, grade II DD, normal RV function, mild-moderate MR, mild-moderate TR. Patient became volume overloaded that admission and was treated with IV lasix. She was again admitted to [facility] from 7/6-7/18 with acute renal failure in the setting of CAD. Initial creatinine up to 4.9 on arrival with BUN 142. It was suspected that AKI was due to poor oral intake, heat exhaustion as temperature of the home was kept in the high 80s by patient and her husband. She was treated with IV hydration and creatinine improved to 1.44. She was  discharged to short-term rehab facility at discharge.   Patient presented to the ED at [facility] after she had a loss of consciousness at home. EMS found her to have SBP in the 50s. She went into ventricular tachycardia and converted to sinus rhythm after 1 shock. In the ED, patient went into VT arrest. She was again shocked once in the ED and returned to sinus rhythm.  Labs notable for BUN 168, creatinine 6.01. Troponin normal x2. UA with bacteriuria, pyuria, positive nitrites. She was given IV fluids and started on IV amiodarone, and was transferred to [facility] for admission.   On interview, patient denies being in any pain or discomfort. Daughter is at bedside and provided most of the history. Per daughter, patient was diagnosed with dementia about 4-5 years ago. Over the past month or so, her symptoms have been progressing. She can no longer walk and has a poor appetite. She does not drink water.  Daughter lives with the patient. Recently, patient has been in her usual state of health. She was helping patient eat dinner and get ready for bed on 9/23 when the patient passed out. Reports that the syncope came on suddenly and there did not seem to be any prodrome   Past Medical History:  Diagnosis Date   Alpha thalassemia trait 01/26/2010   02/2012: Nl CBC ex H&H-10.7/34.8, MCV-69    Anemia    Anxiety    Anxiety and depression    Cellulitis 05/09/2017   Depression    Diabetes mellitus    Foot pain, right 04/30/2013   GERD (gastroesophageal reflux disease)  Headache(784.0)    Hyperlipidemia    Hypertension    Iron deficiency 01/21/2017   Microcytic anemia 01/26/2010   02/2012: Nl CBC ex H&H-10.7/34.8, MCV-69    NECK PAIN, CHRONIC 10/21/2008   +chronic back pain     Obesity    Obstructive sleep apnea    Osteoarthritis    Left knee; right shoulder; chronic neck and back pain   Pruritus    PVD (peripheral vascular disease) (HCC) 01/28/2014   Seizures (HCC)    Shoulder  pain, right 04/14/2015   Tremor    This started months ago after her seizure progressing to very poor hand writing   Uncontrolled type 2 diabetes mellitus with hypoglycemia, with long-term current use of insulin (HCC) 06/12/2022   Urinary incontinence    UTI (urinary tract infection) 01/18/2013    Past Surgical History:  Procedure Laterality Date   ABDOMINAL HYSTERECTOMY     AMPUTATION TOE Right 12/20/2022   Procedure: RIGHT TOE AMPUTATION TOE;  Surgeon: Louann Sjogren, DPM;  Location: [facility] OR;  Service: Podiatry;  Laterality: Right;   BREAST EXCISIONAL BIOPSY     Left; cyst   CATARACT EXTRACTION Right    12/2017   CATARACT EXTRACTION W/ INTRAOCULAR LENS IMPLANT Left 09/07/2013   CHOLECYSTECTOMY     COLONOSCOPY     COLONOSCOPY N/A 07/20/2015   Procedure: COLONOSCOPY;  Surgeon: Malissa Hippo, MD;  Location: [facility] ENDO SUITE;  Service: Endoscopy;  Laterality: N/A;  930   EYE SURGERY Left 09/07/2013   cataract     Home Medications:  Prior to Admission medications   Medication Sig Start Date End Date Taking? Authorizing Provider  amLODipine (NORVASC) 2.5 MG tablet Take 1 tablet (2.5 mg total) by mouth daily. 05/07/23  Yes Kerri Perches, MD  aspirin 81 MG tablet Take 81 mg by mouth daily.   Yes [provider]  atorvastatin (LIPITOR) 40 MG tablet TAKE 1 TABLET BY MOUTH EVERY DAY 01/25/23  Yes Kerri Perches, MD  benazepril (LOTENSIN) 20 MG tablet TAKE 1 TABLET BY MOUTH EVERY DAY 03/18/23  Yes Kerri Perches, MD  buPROPion (WELLBUTRIN XL) 150 MG 24 hr tablet TAKE 1 TABLET BY MOUTH EVERY MORNING 09/30/22  Yes Myrlene Broker, MD  Cholecalciferol 125 MCG (5000 UT) TABS Take 5,000 Units by mouth daily.   Yes [provider]  Cyanocobalamin (VITAMIN B-12) 5000 MCG TBDP Take 5,000 mcg by mouth daily.   Yes [provider]  diphenhydrAMINE (BENADRYL) 25 MG tablet Take 25 mg by mouth at bedtime.   Yes [provider]  donepezil (ARICEPT) 10 MG tablet  Take 1 tablet (10 mg total) by mouth at bedtime. 11/14/22  Yes Kerri Perches, MD  ferrous sulfate 325 (65 FE) MG EC tablet Take 325 mg by mouth daily with breakfast.   Yes [provider]  gabapentin (NEURONTIN) 400 MG capsule Take 1 capsule (400 mg total) by mouth 2 (two) times daily. 05/07/23  Yes Kerri Perches, MD  insulin degludec (TRESIBA FLEXTOUCH) 200 UNIT/ML FlexTouch Pen Inject 12 Units into the skin at bedtime. 05/10/22  Yes Dani Gobble, NP  metolazone (ZAROXOLYN) 5 MG tablet Take 5 mg by mouth every Monday, Wednesday, and Friday.   Yes [provider]  mirtazapine (REMERON) 15 MG tablet Take 1 tablet (15 mg total) by mouth at bedtime. 05/07/23  Yes Kerri Perches, MD  Multiple Vitamin (MULTIVITAMIN) capsule Take 1 capsule by mouth daily.   Yes [provider]  NONFORMULARY OR COMPOUNDED ITEM Apply 1 each topically every 6 (six) hours as needed (perianal pain). Apothecary hemorrhoid cream. Apply rectally up to qid as directed. 02/04/23  Yes Dolores Frame, MD  omeprazole (PRILOSEC) 20 MG capsule TAKE ONE CAPSULE BY MOUTH EVERY DAY 01/25/23  Yes Kerri Perches, MD  QUEtiapine (SEROQUEL) 100 MG tablet Take 3 tablets at bedtime 05/07/23  Yes Kerri Perches, MD  sertraline (ZOLOFT) 100 MG tablet TAKE 1 TABLET BY MOUTH DAILY 07/30/22  Yes Myrlene Broker, MD  torsemide (DEMADEX) 20 MG tablet TAKE 2 TABLETS BY MOUTH EVERY DAY and additionally TAKE 1 TABLET EVERY OTHER DAY Patient taking differently: Take 40 mg by mouth daily. 03/28/22  Yes Antoine Poche, MD  traZODone (DESYREL) 50 MG tablet TAKE 1/2 TABLET BY MOUTH AT BEDTIME FOR SLEEP 03/18/23  Yes Kerri Perches, MD  Accu-Chek FastClix Lancets MISC 1 each by Does not apply route 4 (four) times daily. Use to monitor glucose levels 4 times daily; E11.65 04/10/22   Dani Gobble, NP  ACCU-CHEK GUIDE test strip USE TO CHECK BLOOD SUGAR FOUR TIMES DAILY 02/26/23   Dani Gobble, NP  Alcohol Swabs (B-D SINGLE USE SWABS REGULAR) PADS 1 each by Does not apply route 4 (four) times daily. Use to prep site for glucose monitoring 4 times daily; E11.65 06/12/19   Romero Belling, MD  Blood Glucose Monitoring Suppl (ACCU-CHEK GUIDE ME) w/Device KIT 1 each by Does not apply route 4 (four) times daily. Use to monitor glucose levels 4 times daily; E11.65 06/12/19   Romero Belling, MD  glucose blood test strip 1 each by Other route in the morning, at noon, in the evening, and at bedtime. Use as instructed qid. E11.65    [provider]  Incontinence Supply Disposable (TRANQUILITY UNDERGARMENTS) MISC 1 each by Does not apply route daily. 10/29/22   Dolores Frame, MD  Insulin Pen Needle (EASY TOUCH PEN NEEDLES) 31G X 5 MM MISC USE TO INJECT INSULIN ONCE DAILY 08/14/22   Dani Gobble, NP  Insulin Syringe-Needle U-100 (INSULIN SYRINGE 1CC/31GX5/16") 31G X 5/16" 1 ML MISC 1 each by Does not apply route daily. Use to inject insulin daily; E11.65 06/12/19   Romero Belling, MD    Inpatient Medications: Scheduled Meds:  aspirin EC  81 mg Oral Daily   atorvastatin  40 mg Oral Daily   buPROPion  150 mg Oral q morning   cyanocobalamin  5,000 mcg Oral Daily   gabapentin  300 mg Oral QHS   heparin  5,000 Units Subcutaneous Q8H   insulin aspart  0-5 Units Subcutaneous QHS   insulin aspart  0-6 Units Subcutaneous TID WC   sodium chloride flush  3 mL Intravenous Q12H   Continuous Infusions:  sodium chloride 125 mL/hr at 06/19/23 1104   amiodarone 30 mg/hr (06/19/23 1230)   cefTRIAXone (ROCEPHIN)  IV Stopped (06/18/23 2336)   PRN Meds: acetaminophen  Allergies:    Allergies  Allergen Reactions   Penicillins Shortness Of Breath, Itching and Rash   Prednisone Shortness Of Breath, Itching and Rash   Propoxyphene N-Acetaminophen Itching and Nausea And Vomiting    Darvocet    Gentamicin Rash    Cream   Sulfa Antibiotics Itching and Nausea And Vomiting     Social History:   Social History   Socioeconomic History   Marital status: Married    Spouse name: saunders   Number of children: 5   Years of education:  12   Highest education level: Not on file  Occupational History   Occupation: Disabled     Employer: RETIRED  Tobacco Use   Smoking status: Never    Passive exposure: Past   Smokeless tobacco: Never  Vaping Use   Vaping status: Never Used  Substance and Sexual Activity   Alcohol use: No    Alcohol/week: 0.0 standard drinks of alcohol   Drug use: No   Sexual activity: Yes    Birth control/protection: Surgical  Other Topics Concern   Not on file  Social History Narrative   07/27/20 Patient lives at home with her husband Shara Blazing) and dgtr- Jasmine December.  Patient is retired.    Right handed.    Five Children.    Caffeine- 2 daily   Social Determinants of Health   Financial Resource Strain: Low Risk  (04/01/2023)   Received from [facility]   Overall Financial Resource Strain (CARDIA)    Difficulty of Paying Living Expenses: Not hard at all  Food Insecurity: No Food Insecurity (04/01/2023)   Received from [facility]   Hunger Vital Sign    Worried About Running Out of Food in the Last Year: Never true    Ran Out of Food in the Last Year: Never true  Transportation Needs: No Transportation Needs (04/01/2023)   Received from [facility] - Transportation    Lack of Transportation (Medical): No    Lack of Transportation (Non-Medical): No  Physical Activity: Inactive (04/18/2022)   Exercise Vital Sign    Days of Exercise per Week: 0 days    Minutes of Exercise per Session: 0 min  Stress: No Stress Concern Present (04/18/2022)   [employer] of Occupational Health - Occupational Stress Questionnaire    Feeling of Stress : Not at all  Social Connections: Moderately Integrated (04/18/2022)   Social Connection and Isolation Panel [NHANES]    Frequency of Communication with Friends and Family: More than  three times a week    Frequency of Social Gatherings with Friends and Family: More than three times a week    Attends Religious Services: More than 4 times per year    Active Member of [employer] or Organizations: No    Attends Banker Meetings: Never    Marital Status: Married  Catering manager Violence: Not At Risk (12/18/2022)   Humiliation, Afraid, Rape, and Kick questionnaire    Fear of Current or Ex-Partner: No    Emotionally Abused: No    Physically Abused: No    Sexually Abused: No    Family History:    Family History  Problem Relation Age of Onset   Lung cancer Mother 37   Kidney disease Father    Diabetes Sister    Keloids Brother    ADD / ADHD Grandchild    Bipolar disorder Grandchild    Bipolar disorder Daughter    Seizures Daughter    Heart disease Daughter    Kidney disease Son    Neuropathy Son    Kidney disease Son    Edema Daughter    Breast cancer Daughter 55   Allergies Daughter    Alcohol abuse Neg Hx    Drug abuse Neg Hx      ROS:  Please see the history of present illness.   All other ROS reviewed and negative.     Physical Exam/Data:   Vitals:   06/18/23 1957 06/19/23 0507 06/19/23 0815 06/19/23 1408  BP: 113/60 Marland Kitchen)  131/57 125/60   Pulse: 70 67 72 69  Resp: 17 18 18 18   Temp: 98.2 F (36.8 C) 97.6 F (36.4 C) 97.8 F (36.6 C) 98.3 F (36.8 C)  TempSrc: Oral Axillary Oral Oral  SpO2: 99% 95%    Weight:  90 kg      Intake/Output Summary (Last 24 hours) at 06/19/2023 1422 Last data filed at 06/19/2023 0656 Gross per 24 hour  Intake 854.85 ml  Output 700 ml  Net 154.85 ml      06/19/2023    5:07 AM 05/23/2023   12:56 PM 05/07/2023    1:39 PM  Last 3 Weights  Weight (lbs) 198 lb 6.6 oz -- 227 lb  Weight (kg) 90 kg -- 102.967 kg     Body mass index is 30.17 kg/m.  General:  Well nourished, well developed, in no acute distress. Laying in the bed with head elevated  HEENT: normal Neck: no JVD Vascular: radial pulses 2+  bilaterally Cardiac:  normal S1, S2; RRR; no murmur  Lungs:  anterior lung exam clear. Normal work of breathing on room air  Abd: soft, nontender Ext: no edema in BLE  Musculoskeletal:  No deformities, BUE and BLE strength normal and equal Skin: warm and dry  Neuro:  CNs 2-12 intact, no focal abnormalities noted Psych:  Normal affect   EKG:  The EKG was personally reviewed and demonstrates:  Sinus rhythm with PVCs, HR 75 BPM, nonspecific IVCD  Telemetry:  Telemetry was personally reviewed and demonstrates:  Sinus with PVCs, ventricular bigeminy   Relevant CV Studies:   Laboratory Data:  High Sensitivity Troponin:  No results for input(s): "TROPONINIHS" in the last 720 hours.   Chemistry Recent Labs  Lab 06/18/23 2031 06/19/23 0409  NA 143 148*  K 4.2 4.3  CL 110 110  CO2 24 23  GLUCOSE 119* 119*  BUN 127* 112*  CREATININE 3.17* 2.83*  CALCIUM 9.3 9.5  MG 1.8  --   GFRNONAA 14* 16*  ANIONGAP 9 15    No results for input(s): "PROT", "ALBUMIN", "AST", "ALT", "ALKPHOS", "BILITOT" in the last 168 hours. Lipids No results for input(s): "CHOL", "TRIG", "HDL", "LABVLDL", "LDLCALC", "CHOLHDL" in the last 168 hours.  Hematology Recent Labs  Lab 06/18/23 2031 06/19/23 0409  WBC 6.6 6.0  RBC 4.62 4.05  HGB 9.8* 9.0*  HCT 33.1* 29.3*  MCV 71.6* 72.3*  MCH 21.2* 22.2*  MCHC 29.6* 30.7  RDW 17.2* 17.2*  PLT 193 168   Thyroid No results for input(s): "TSH", "FREET4" in the last 168 hours.  BNPNo results for input(s): "BNP", "PROBNP" in the last 168 hours.  DDimer No results for input(s): "DDIMER" in the last 168 hours.   Radiology/Studies:  No results found.   Assessment and Plan:   Ventricular Tachycardia  PVCs  - Patient had vt with EMS and was shocked. Had recurrence of VT in the ED and was shocked a second time. Now on IV amiodarone  - K 4.9 and mag 2.0 at the time. BUN elevated to 168 with creatinine 6.01  - hsTn 20>23  - Echocardiogram ordered and pending   - On IV amiodarone, patient continues to have PVCs, but has not been having runs of VT. Given patient's progressive dementia, favor managing more conservatively. I do not suspect she is a candidate for ICD - Maintain Mag>2, K >4  - Continue IV amiodarone load for now. Anticipate transition to PO prior to DC  - Daily EKG to monitor  QT   AKI on CKD stage IIIb - Baseline creatinine around 1.4. Was up to 6.01 on presentation  - Patient was treated with IV fluids. Creatinine down to 2.83 today  - Nephrology consulted- per their notes, appears patient is a very poor candidate for dialysis and they are hoping to avoid   HLD  - Continue lipitor 40 mg daily   HTN  - BP well controlled   Otherwise per primary  - UTI - Dementia - Depression  - Anxiety - Insomnia  - Insulin-dependent DM    Risk Assessment/Risk Scores:      For questions or updates, please contact [facility] Please consult [email or web address] for contact info under    Signed, Jonita Albee, PA-C  06/19/2023 2:22 PM  I have seen and examined the patient along with Jonita Albee, PA-C .  I have reviewed the chart, notes and new data.  I agree with PA/NP's note.  Key new complaints: Feels very comfortable right now.  Denies any cardiovascular complaints.  She has no recollection of syncope.  Has not had any chest pain or trouble breathing.  She has had issues with "extra beats" for many years, but has not had previous ventricular tachycardia.  She has clear problems with short and mid-term memory, although she is able to hold a coherent conversation and is quite pleasant and engaging. Key examination changes: Regular baseline rhythm with frequent periods of ectopy in a pattern of trigeminy.  She is unaware of the ectopy.  Otherwise normal cardiovascular examination. Key new findings / data: Unfortunately the electrocardiographic tracings interpret as VT that led to her defibrillation/cardioversion are not  available for review.  ECG here shows sinus rhythm, PVCs in a pattern of quadrigeminy, nonspecific IVCD (QRS duration 122 ms) mildly prolonged QTc at 486 ms.  On telemetry she has frequent monomorphic PVCs, often in a pattern of trigeminy, but has not had any VT.  Major metabolic abnormalities in the setting of acute renal failure, that appear to be improving.  Moderate anemia with marked microcytosis but with normal RBC count consistent with thalassemia.  Echocardiogram is reviewed and shows unchanged borderline LVEF 50-55%, without major LVH or valvular abnormalities.  PLAN:  Suspect she has an underlying frequent PVCs, arrhythmia exacerbated by major metabolic abnormalities in the setting of acute renal failure. It appears that her VT occurred on the background of severe bradycardia (question if bradycardia cause prolongation of QT with R-on-T phenomenon and torsades).  Unable to review the tracings. None of her home medications could be directly implicated in causing bradycardia, except possibly the donepezil. Note increased risk of QT prolongation from trazodone and Seroquel (risk is lower with the Remeron). Thankfully she does not have any major structural cardiac abnormalities, which lessens the concern for recurrent malignant ventricular arrhythmia.  She does not have angina and she had a low risk nuclear stress test, albeit not recently. It is advisable to avoid aggressive/invasive medical procedures in this elderly lady with moderate dementia.  Transition from intravenous amiodarone to oral amiodarone and plan to continue treatment for at least 30 days, while we clarify whether this is likely to become a recurrent arrhythmia.  If possible need to simplify her psychotropic medications and in particular try to eliminate Seroquel from her prescription.   Thurmon Fair, MD, [facility] [phone] 06/19/2023, 4:45 PM

## 2023-06-19 NOTE — Progress Notes (Addendum)
Laura Atkins  MVH:[id number] DOB: [birth year] DOA: 06/18/2023 PCP: Kerri Perches, MD    Brief Narrative:  82 year old with a history of dementia, depression/anxiety, insomnia, DM2, HTN, and CKD stage IIIb who presented to [facility] ER 9/23 after losing consciousness at home.  While in the ER her systolic blood pressures dropped into the 50s with a heart rate as low as 28 and she was found to be in VT.  She converted to sinus rhythm after 1 shock.  She was found to have a BUN of 168 and a creatinine of 6.  Goals of Care:   Code Status: Full Code   DVT prophylaxis: heparin injection 5,000 Units Start: 06/18/23 2200   Interim Hx: No acute events recorded overnight.  Afebrile.  Vital signs stable.  Renal function rapidly improving.  BUN improving.  In good spirits.  Denies new complaints.  Visiting with multiple family members in the room.  Assessment & Plan:  Unstable ventricular tachycardia Converted to sinus rhythm after shock x 2 episodes -care per cardiology -felt to be due to severe uremia -continue amiodarone - avoiding meds that may have impact on QT, to include seroquel   Acute kidney failure on CKD stage IIIb Creatinine 6.01 at presentation with BUN of 168 -rapidly improving with perfusion  Abnormal UA POA Empiric Rocephin -follow-up urine culture  Dementia with depression/anxiety and insomnia Appears well compensated presently  HTN Blood pressure well-controlled at present  DM2 A1c 6.5 -CBG reasonably well-controlled  Family Communication: Spoke with multiple family members including daughters at bedside Disposition: Unclear at present -will need PT/OT when more stable clinically   Objective: Blood pressure 125/60, pulse 72, temperature 97.8 F (36.6 C), temperature source Oral, resp. rate 18, weight 90 kg, SpO2 95%.  Intake/Output Summary (Last 24 hours) at 06/19/2023 1022 Last data filed at 06/19/2023 0656 Gross per 24 hour  Intake 854.85 ml  Output  700 ml  Net 154.85 ml   Filed Weights   06/19/23 0507  Weight: 90 kg    Examination: General: No acute respiratory distress Lungs: Clear to auscultation bilaterally without wheezes or crackles Cardiovascular: Regular rate and rhythm without murmur gallop or rub normal S1 and S2 Abdomen: Nontender, nondistended, soft, bowel sounds positive, no rebound, no ascites, no appreciable mass Extremities: No significant cyanosis, clubbing, or edema bilateral lower extremities  CBC: Recent Labs  Lab 06/18/23 2031 06/19/23 0409  WBC 6.6 6.0  HGB 9.8* 9.0*  HCT 33.1* 29.3*  MCV 71.6* 72.3*  PLT 193 168   Basic Metabolic Panel: Recent Labs  Lab 06/18/23 2031 06/19/23 0409  NA 143 148*  K 4.2 4.3  CL 110 110  CO2 24 23  GLUCOSE 119* 119*  BUN 127* 112*  CREATININE 3.17* 2.83*  CALCIUM 9.3 9.5  MG 1.8  --   PHOS 4.1  --    GFR: Estimated Creatinine Clearance: 18.9 mL/min (A) (by C-G formula based on SCr of 2.83 mg/dL (H)).   Scheduled Meds:  aspirin EC  81 mg Oral Daily   atorvastatin  40 mg Oral Daily   gabapentin  300 mg Oral QHS   heparin  5,000 Units Subcutaneous Q8H   insulin aspart  0-5 Units Subcutaneous QHS   insulin aspart  0-6 Units Subcutaneous TID WC   sodium chloride flush  3 mL Intravenous Q12H   Continuous Infusions:  sodium chloride 125 mL/hr at 06/19/23 0656   amiodarone 30 mg/hr (06/19/23 0656)   cefTRIAXone (ROCEPHIN)  IV Stopped (  06/18/23 2336)     LOS: 1 day   Lonia Blood, MD [facility] Office  [phone] Pager - Text Page per Amion  If 7PM-7AM, please contact night-coverage per Amion 06/19/2023, 10:22 AM

## 2023-06-19 NOTE — Consult Note (Addendum)
[facility] Resident Consult Note  Reason for Consult: AKI on CKD Referring Physician: Lonia Blood, MD  HPI: Hospital day 1.  82 y.o. presented with syncope and hypotension at home to [facility]. 1 shock for V-tach prior to arrival at ED. At [facility] her BUN/Cr was 168/6, improved to 143/4.3 with IV fluids. Ceftriaxone started for presumed UTI. Transferred to [facility] for admission to hospitalist service.  Follows with Dr. Wolfgang Phoenix of nephrology and Dr. Wyline Mood of cardiology.  Family note declining functional status of late. Less mobility over last 2 weeks, much worse 4 days prior to admission. Patient is declining water at home and holds urine. She has not been eating well.  Today, she reports hip pain. Otherwise review of systems is negative.  In interval, 700 UOP, BUN and creatinine improving.   Mrs. Reichelt has a PMH significant for dementia, depression/anxiety, IDDM, HTN, and CKD stage IIIb (followed by Dr. Wolfgang Phoenix) who has sudden loss of consciousness at home on 06/17/23 and when EMS arrived she was profoundly hypotensive with SBP in the 50's and was found to be in V tach.  S/p 1 shock and converted to NSR.  Her Scr peaked at 6 but improved with IVF's.  She was transferred to [facility] for further management.  We were consulted due to the development of AKI/CKD stage IIIb.  Her baseline Scr appears to be 1.3-1.7.  The trend in Scr is seen below.  She currently is without any complaints.  Her daughter who was at the bedside did report that she does not drink much water at all and has not been eating well prior to admission.  Of note, she was also taking benazepril and torsemide prior to admission.   Trend in Creatinine:  Creatinine, Ser  Date/Time Value Ref Range Status  06/19/2023 04:09 AM 2.83 (H) 0.44 - 1.00 mg/dL Final  16/06/9603 54:09 PM 3.17 (H) 0.44 - 1.00 mg/dL Final  81/19/1478 29:56 AM 4.34 (H) 0.60 - 1.10 mg/dL Final  21/30/8657 84:69 PM 6.01 (HH) 0.60 - 1.10 mg/dL  Final  62/95/2841 32:44 PM 1.42 (H) 0.44 - 1.00 mg/dL Final  09/26/7251 66:44 PM 1.74 (H) 0.44 - 1.00 mg/dL Final  03/47/4259 56:38 AM 1.28 (H) 0.44 - 1.00 mg/dL Final  75/64/3329 51:88 AM 1.26 (H) 0.44 - 1.00 mg/dL Final  41/66/0630 16:01 AM 1.34 (H) 0.44 - 1.00 mg/dL Final  09/32/3557 32:20 AM 1.68 (H) 0.44 - 1.00 mg/dL Final  25/42/7062 37:62 PM 1.53 (H) 0.44 - 1.00 mg/dL Final  83/15/1761 60:73 PM 1.71 (H) 0.44 - 1.00 mg/dL Final  71/02/2693 85:46 PM 1.65 (H) 0.57 - 1.00 mg/dL Final  27/11/5007 38:18 AM 1.55 (H) 0.57 - 1.00 mg/dL Final  29/93/7169 67:89 AM 1.13 (H) 0.44 - 1.00 mg/dL Final  38/06/1750 02:58 AM 1.17 (H) 0.44 - 1.00 mg/dL Final  52/77/8242 35:36 AM 1.54 (H) 0.44 - 1.00 mg/dL Final  14/43/1540 08:67 AM 1.32 (H) 0.44 - 1.00 mg/dL Final  61/95/0932 67:12 AM 1.19 (H) 0.44 - 1.00 mg/dL Final  45/80/9983 38:25 AM 1.51 (H) 0.44 - 1.00 mg/dL Final  05/39/7673 41:93 AM 1.78 (H) 0.44 - 1.00 mg/dL Final  79/10/4095 35:32 PM 2.71 (H) 0.44 - 1.00 mg/dL Final  99/24/2683 41:96 PM 1.04 (H) 0.44 - 1.00 mg/dL Final  22/29/7989 21:19 PM 1.05 (H) 0.44 - 1.00 mg/dL Final  41/74/0814 48:18 AM 1.01 (H) 0.44 - 1.00 mg/dL Final  56/31/4970 26:37 PM 1.31 (H) 0.44 - 1.00 mg/dL Final  85/88/5027 74:12 AM  1.10 (H) 0.44 - 1.00 mg/dL Final  32/44/0102 72:53 PM 0.91 0.50 - 1.10 mg/dL Final  66/44/0347 42:59 AM 0.81 0.50 - 1.10 mg/dL Final  56/38/7564 33:29 AM 0.75 0.50 - 1.10 mg/dL Final  51/88/4166 06:30 AM 0.63 0.50 - 1.10 mg/dL Final  16/09/930 35:57 PM 0.61 0.50 - 1.10 mg/dL Final  32/20/2542 70:62 AM 0.65 0.50 - 1.10 mg/dL Final  37/62/8315 17:61 PM 0.59 0.50 - 1.10 mg/dL Final  60/73/7106 26:94 PM 0.74 0.40 - 1.20 mg/dL Final  85/46/2703 50:09 PM 0.84 0.40 - 1.20 mg/dL Final  38/18/2993 71:69 PM 0.72 0.40 - 1.20 mg/dL Final  67/89/3810 17:51 PM 0.76 0.40 - 1.20 mg/dL Final  02/58/5277 82:42 PM 0.74 0.40 - 1.20 mg/dL Final  35/36/1443 15:40 PM 0.79 0.40 - 1.20 mg/dL Final  08/67/6195  09:32 PM 0.80 0.4 - 1.2 mg/dL Final  67/08/4579 99:83 PM 0.76 0.40 - 1.20 mg/dL Final  38/25/0539 76:73 PM 0.74 0.40 - 1.20 mg/dL Final  41/93/7902 40:97 PM 0.68 0.40 - 1.20 mg/dL Final  35/32/9924 26:83 PM 0.76 0.40 - 1.20 mg/dL Final  41/96/2229 79:89 PM 0.66 0.40 - 1.20 mg/dL Final  21/19/4174 08:14 PM 0.80 0.40 - 1.20 mg/dL Final    PMH:   Past Medical History:  Diagnosis Date   Alpha thalassemia trait 01/26/2010   02/2012: Nl CBC ex H&H-10.7/34.8, MCV-69    Anemia    Anxiety    Anxiety and depression    Cellulitis 05/09/2017   Depression    Diabetes mellitus    Foot pain, right 04/30/2013   GERD (gastroesophageal reflux disease)    Headache(784.0)    Hyperlipidemia    Hypertension    Iron deficiency 01/21/2017   Microcytic anemia 01/26/2010   02/2012: Nl CBC ex H&H-10.7/34.8, MCV-69    NECK PAIN, CHRONIC 10/21/2008   +chronic back pain     Obesity    Obstructive sleep apnea    Osteoarthritis    Left knee; right shoulder; chronic neck and back pain   Pruritus    PVD (peripheral vascular disease) (HCC) 01/28/2014   Seizures (HCC)    Shoulder pain, right 04/14/2015   Tremor    This started months ago after her seizure progressing to very poor hand writing   Uncontrolled type 2 diabetes mellitus with hypoglycemia, with long-term current use of insulin (HCC) 06/12/2022   Urinary incontinence    UTI (urinary tract infection) 01/18/2013    PSH:   Past Surgical History:  Procedure Laterality Date   ABDOMINAL HYSTERECTOMY     AMPUTATION TOE Right 12/20/2022   Procedure: RIGHT TOE AMPUTATION TOE;  Surgeon: Louann Sjogren, DPM;  Location: [facility] OR;  Service: Podiatry;  Laterality: Right;   BREAST EXCISIONAL BIOPSY     Left; cyst   CATARACT EXTRACTION Right    12/2017   CATARACT EXTRACTION W/ INTRAOCULAR LENS IMPLANT Left 09/07/2013   CHOLECYSTECTOMY     COLONOSCOPY     COLONOSCOPY N/A 07/20/2015   Procedure: COLONOSCOPY;  Surgeon: Malissa Hippo, MD;  Location: [facility];  Service: Endoscopy;  Laterality: N/A;  930   EYE SURGERY Left 09/07/2013   cataract    Allergies:  Allergies  Allergen Reactions   Penicillins Shortness Of Breath, Itching and Rash   Prednisone Shortness Of Breath, Itching and Rash   Propoxyphene N-Acetaminophen Itching and Nausea And Vomiting    Darvocet    Gentamicin Rash    Cream   Sulfa Antibiotics Itching and Nausea And Vomiting  Medications:   Prior to Admission medications   Medication Sig Start Date End Date Taking? Authorizing Provider  amLODipine (NORVASC) 2.5 MG tablet Take 1 tablet (2.5 mg total) by mouth daily. 05/07/23  Yes Kerri Perches, MD  aspirin 81 MG tablet Take 81 mg by mouth daily.   Yes [provider]  atorvastatin (LIPITOR) 40 MG tablet TAKE 1 TABLET BY MOUTH EVERY DAY 01/25/23  Yes Kerri Perches, MD  benazepril (LOTENSIN) 20 MG tablet TAKE 1 TABLET BY MOUTH EVERY DAY 03/18/23  Yes Kerri Perches, MD  buPROPion (WELLBUTRIN XL) 150 MG 24 hr tablet TAKE 1 TABLET BY MOUTH EVERY MORNING 09/30/22  Yes Myrlene Broker, MD  Cholecalciferol 125 MCG (5000 UT) TABS Take 5,000 Units by mouth daily.   Yes [provider]  Cyanocobalamin (VITAMIN B-12) 5000 MCG TBDP Take 5,000 mcg by mouth daily.   Yes [provider]  diphenhydrAMINE (BENADRYL) 25 MG tablet Take 25 mg by mouth at bedtime.   Yes [provider]  donepezil (ARICEPT) 10 MG tablet Take 1 tablet (10 mg total) by mouth at bedtime. 11/14/22  Yes Kerri Perches, MD  ferrous sulfate 325 (65 FE) MG EC tablet Take 325 mg by mouth daily with breakfast.   Yes [provider]  gabapentin (NEURONTIN) 400 MG capsule Take 1 capsule (400 mg total) by mouth 2 (two) times daily. 05/07/23  Yes Kerri Perches, MD  insulin degludec (TRESIBA FLEXTOUCH) 200 UNIT/ML FlexTouch Pen Inject 12 Units into the skin at bedtime. 05/10/22  Yes Dani Gobble, NP  metolazone (ZAROXOLYN) 5 MG tablet Take 5 mg by  mouth every Monday, Wednesday, and Friday.   Yes [provider]  mirtazapine (REMERON) 15 MG tablet Take 1 tablet (15 mg total) by mouth at bedtime. 05/07/23  Yes Kerri Perches, MD  Multiple Vitamin (MULTIVITAMIN) capsule Take 1 capsule by mouth daily.   Yes [provider]  NONFORMULARY OR COMPOUNDED ITEM Apply 1 each topically every 6 (six) hours as needed (perianal pain). Apothecary hemorrhoid cream. Apply rectally up to qid as directed. 02/04/23  Yes Dolores Frame, MD  omeprazole (PRILOSEC) 20 MG capsule TAKE ONE CAPSULE BY MOUTH EVERY DAY 01/25/23  Yes Kerri Perches, MD  QUEtiapine (SEROQUEL) 100 MG tablet Take 3 tablets at bedtime 05/07/23  Yes Kerri Perches, MD  sertraline (ZOLOFT) 100 MG tablet TAKE 1 TABLET BY MOUTH DAILY 07/30/22  Yes Myrlene Broker, MD  torsemide (DEMADEX) 20 MG tablet TAKE 2 TABLETS BY MOUTH EVERY DAY and additionally TAKE 1 TABLET EVERY OTHER DAY Patient taking differently: Take 40 mg by mouth daily. 03/28/22  Yes Antoine Poche, MD  traZODone (DESYREL) 50 MG tablet TAKE 1/2 TABLET BY MOUTH AT BEDTIME FOR SLEEP 03/18/23  Yes Kerri Perches, MD  Accu-Chek FastClix Lancets MISC 1 each by Does not apply route 4 (four) times daily. Use to monitor glucose levels 4 times daily; E11.65 04/10/22   Dani Gobble, NP  ACCU-CHEK GUIDE test strip USE TO CHECK BLOOD SUGAR FOUR TIMES DAILY 02/26/23   Dani Gobble, NP  Alcohol Swabs (B-D SINGLE USE SWABS REGULAR) PADS 1 each by Does not apply route 4 (four) times daily. Use to prep site for glucose monitoring 4 times daily; E11.65 06/12/19   Romero Belling, MD  Blood Glucose Monitoring Suppl (ACCU-CHEK GUIDE ME) w/Device KIT 1 each by Does not apply route 4 (four) times daily. Use to monitor glucose levels  4 times daily; E11.65 06/12/19   Romero Belling, MD  glucose blood test strip 1 each by Other route in the morning, at noon, in the evening, and at bedtime. Use as instructed qid.  E11.65    [provider]  Incontinence Supply Disposable (TRANQUILITY UNDERGARMENTS) MISC 1 each by Does not apply route daily. 10/29/22   Dolores Frame, MD  Insulin Pen Needle (EASY TOUCH PEN NEEDLES) 31G X 5 MM MISC USE TO INJECT INSULIN ONCE DAILY 08/14/22   Dani Gobble, NP  Insulin Syringe-Needle U-100 (INSULIN SYRINGE 1CC/31GX5/16") 31G X 5/16" 1 ML MISC 1 each by Does not apply route daily. Use to inject insulin daily; E11.65 06/12/19   Romero Belling, MD  UNABLE TO FIND Walker x 1  DX unsteady gait, osteoarthritis of left knee 02/28/17   Kerri Perches, MD  UNABLE TO FIND Elevated Toliet Seat x 1 DX: unsteady gait, back pain, osteoarthritis of left knee 02/28/17   Kerri Perches, MD  UNABLE TO FIND Standing upright walker x 1  DX M54.40, M19.90 03/01/20   Kerri Perches, MD  UNABLE TO FIND Incontinence pads and supplies 10/18/20   Heather Roberts, NP  UNABLE TO FIND Rollator Dan Humphreys 12/26/20   Kerri Perches, MD  UNABLE TO FIND XL Tranquility Briefs for incontinence. 12/26/20   Kerri Perches, MD  UNABLE TO FIND Premium overnight disposible absorbant underwear XL UPC- [id number] 06/22/22   Kerri Perches, MD    Inpatient medications:  aspirin EC  81 mg Oral Daily   atorvastatin  40 mg Oral Daily   gabapentin  300 mg Oral QHS   heparin  5,000 Units Subcutaneous Q8H   insulin aspart  0-5 Units Subcutaneous QHS   insulin aspart  0-6 Units Subcutaneous TID WC   sodium chloride flush  3 mL Intravenous Q12H    Discontinued Meds:  There are no discontinued medications.  Social History:  reports that she has never smoked. She has been exposed to tobacco smoke. She has never used smokeless tobacco. She reports that she does not drink alcohol and does not use drugs.  Family History:   Family History  Problem Relation Age of Onset   Lung cancer Mother 61   Kidney disease Father    Diabetes Sister    Keloids Brother    ADD / ADHD Grandchild     Bipolar disorder Grandchild    Bipolar disorder Daughter    Seizures Daughter    Heart disease Daughter    Kidney disease Son    Neuropathy Son    Kidney disease Son    Edema Daughter    Breast cancer Daughter 92   Allergies Daughter    Alcohol abuse Neg Hx    Drug abuse Neg Hx     Physical Exam: Weight change:   Intake/Output Summary (Last 24 hours) at 06/19/2023 0954 Last data filed at 06/19/2023 0656 Gross per 24 hour  Intake 854.85 ml  Output 700 ml  Net 154.85 ml   BP 125/60 (BP Location: Right Arm)   Pulse 72   Temp 97.8 F (36.6 C) (Oral)   Resp 18   Wt 90 kg   SpO2 95%   BMI 30.17 kg/m  Vitals:   06/18/23 1957 06/19/23 0507 06/19/23 0815  BP: 113/60 (!) 131/57 125/60  Pulse: 70 67 72  Resp: 17 18 18   Temp: 98.2 F (36.8 C) 97.6 F (36.4 C) 97.8 F (36.6 C)  TempSrc:  Oral Axillary Oral  SpO2: 99% 95%   Weight:  90 kg      No distress Heart rate is normal, rhythm is irregular, sinus on monitor with PVC, radial pulse palpable, no LE edema Breathing comfortably on room air, no crackles Skin warm and dry Abdomen soft, non-tender Alert, oriented to person, answers questions appropriately, follows instructions, asterixis present Pleasant and congruent affect  Basic Metabolic Panel: Recent Labs  Lab 06/18/23 2031 06/19/23 0409  NA 143 148*  K 4.2 4.3  CL 110 110  CO2 24 23  GLUCOSE 119* 119*  BUN 127* 112*  CREATININE 3.17* 2.83*  CALCIUM 9.3 9.5  PHOS 4.1  --    Liver Function Tests: No results for input(s): "AST", "ALT", "ALKPHOS", "BILITOT", "PROT", "ALBUMIN" in the last 168 hours. No results for input(s): "LIPASE", "AMYLASE" in the last 168 hours. No results for input(s): "AMMONIA" in the last 168 hours. CBC: Recent Labs  Lab 06/18/23 2031 06/19/23 0409  WBC 6.6 6.0  HGB 9.8* 9.0*  HCT 33.1* 29.3*  MCV 71.6* 72.3*  PLT 193 168   PT/INR: No results for input(s): "INR" in the last 168 hours. No results for input(s): "CKTOTAL",  "CKMB", "CKMBINDEX", "TROPONINI" in the last 168 hours. CBG: Recent Labs  Lab 06/18/23 2213 06/19/23 0812  GLUCAP 143* 117*    Iron Studies: No results for input(s): "IRON", "TIBC", "TRANSFERRIN", "FERRITIN" in the last 168 hours.  Xrays/Other Studies: No results found.   Assessment/Plan:  AKI on CKD IIIb Seems pre-renal based on story and exam. Improving with IV fluids. Urine P/C is not nephrotic-range. SPEP and FLC pending. RUS pending. Agree with continuing and monitoring BMP. UOP will be challenging to measure as this person is incontinent. Monitor for volume overload--don't note history of CHF syndrome but is treated with torsemide 40 mg outpatient for chronic LE edema.  Presumably ischemic ATN in setting of volume depletion, hypotension, VTach, and concomitant ACE inhibition.  Thankfully she is responding to IVF's.  Continue to hole benazepril and torsemide.   - Avoid nephrotoxic medications including NSAIDs and iodinated intravenous contrast exposure unless the latter is absolutely indicated.  Preferred narcotic agents for pain control are hydromorphone, fentanyl, and methadone. Morphine should not be used. Avoid Baclofen and avoid oral sodium phosphate and magnesium citrate based laxatives / bowel preps. Continue strict Input and Output monitoring. Will monitor the patient closely with you and intervene or adjust therapy as indicated by changes in clinical status/labs    Uremia/Dementia Per daughters, this person's mental status at present is consistent with baseline mental status. She does have some asterixis on exam, but no indication for urgent RRT today.  Scr continues to improve.  No indication for HD.  Hypovolemia Agree with IV fluids, per above. No LE edema or crackles on pulmonary exam.  Hypernatremia Agree with a course of hypotonic fluids today.   UTI Ceftriaxone per primary.  V-tach Query hypovolemia versus uremia as precipitant. Some ectopy on the monitor with  frequent PVC, but otherwise stable. Amiodarone infusion running.  Dementia Functional decline over several days to weeks. High risk for hospital delirium.   Final recommendations pending attending attestation.  Marrianne Mood MD 06/19/2023, 9:54 AM

## 2023-06-20 ENCOUNTER — Other Ambulatory Visit: Payer: Self-pay | Admitting: Family Medicine

## 2023-06-20 ENCOUNTER — Other Ambulatory Visit (HOSPITAL_COMMUNITY): Payer: Self-pay | Admitting: Psychiatry

## 2023-06-20 DIAGNOSIS — I472 Ventricular tachycardia, unspecified: Secondary | ICD-10-CM | POA: Diagnosis not present

## 2023-06-20 LAB — CBC
HCT: 29.7 % — ABNORMAL LOW (ref 36.0–46.0)
Hemoglobin: 9.1 g/dL — ABNORMAL LOW (ref 12.0–15.0)
MCH: 22.4 pg — ABNORMAL LOW (ref 26.0–34.0)
MCHC: 30.6 g/dL (ref 30.0–36.0)
MCV: 73 fL — ABNORMAL LOW (ref 80.0–100.0)
Platelets: 162 10*3/uL (ref 150–400)
RBC: 4.07 MIL/uL (ref 3.87–5.11)
RDW: 17.7 % — ABNORMAL HIGH (ref 11.5–15.5)
WBC: 6.4 10*3/uL (ref 4.0–10.5)
nRBC: 0 % (ref 0.0–0.2)

## 2023-06-20 LAB — BASIC METABOLIC PANEL
Anion gap: 6 (ref 5–15)
BUN: 69 mg/dL — ABNORMAL HIGH (ref 8–23)
CO2: 24 mmol/L (ref 22–32)
Calcium: 8.9 mg/dL (ref 8.9–10.3)
Chloride: 113 mmol/L — ABNORMAL HIGH (ref 98–111)
Creatinine, Ser: 2.01 mg/dL — ABNORMAL HIGH (ref 0.44–1.00)
GFR, Estimated: 25 mL/min — ABNORMAL LOW (ref >60)
Glucose, Bld: 112 mg/dL — ABNORMAL HIGH (ref 70–99)
Potassium: 4 mmol/L (ref 3.5–5.1)
Sodium: 143 mmol/L (ref 135–145)

## 2023-06-20 LAB — URINE CULTURE: Culture: 60000 — AB

## 2023-06-20 LAB — KAPPA/LAMBDA LIGHT CHAINS
Kappa free light chain: 74.1 mg/L — ABNORMAL HIGH (ref 3.3–19.4)
Kappa, lambda light chain ratio: 1.9 — ABNORMAL HIGH (ref 0.26–1.65)
Lambda free light chains: 39.1 mg/L — ABNORMAL HIGH (ref 5.7–26.3)

## 2023-06-20 LAB — MAGNESIUM: Magnesium: 1.6 mg/dL — ABNORMAL LOW (ref 1.7–2.4)

## 2023-06-20 LAB — UREA NITROGEN, URINE: Urea Nitrogen, Ur: 449 mg/dL

## 2023-06-20 LAB — GLUCOSE, CAPILLARY
Glucose-Capillary: 110 mg/dL — ABNORMAL HIGH (ref 70–99)
Glucose-Capillary: 135 mg/dL — ABNORMAL HIGH (ref 70–99)
Glucose-Capillary: 119 mg/dL — ABNORMAL HIGH (ref 70–99)
Glucose-Capillary: 123 mg/dL — ABNORMAL HIGH (ref 70–99)

## 2023-06-20 NOTE — Progress Notes (Signed)
[facility] Resident Progress Note  Subjective Feeling well today. No new concerns. Breathing comfortably. Daughter at bedside reports the bed is "staying wet," so she doesn't have concerns about urine output.  Objective BP (!) 122/52 (BP Location: Right Arm)   Pulse 67   Temp 98.7 F (37.1 C) (Oral)   Resp 20   Wt 91.1 kg   SpO2 94%   BMI 30.54 kg/m   No distress Heart rate and rhythm regular, distant heart sounds, no LE edema Breathing comfortably, anterior lung fields clear Skin warm and dry Alert, disoriented Pleasant and congruent affect  Weight change: 1.1 kg  Intake/Output Summary (Last 24 hours) at 06/20/2023 1110 Last data filed at 06/19/2023 2125 Gross per 24 hour  Intake 179.97 ml  Output --  Net 179.97 ml      Latest Ref Rng & Units 06/20/2023    4:43 AM 06/19/2023    4:09 AM 06/18/2023    8:31 PM  BMP  Glucose 70 - 99 mg/dL 161  096  045   BUN 8 - 23 mg/dL 69  409  811   Creatinine 0.44 - 1.00 mg/dL 9.14  7.82  9.56   Sodium 135 - 145 mmol/L 143  148  143   Potassium 3.5 - 5.1 mmol/L 4.0  4.3  4.2   Chloride 98 - 111 mmol/L 113  110  110   CO2 22 - 32 mmol/L 24  23  24    Calcium 8.9 - 10.3 mg/dL 8.9  9.5  9.3       Latest Ref Rng & Units 06/20/2023    4:43 AM 06/19/2023    4:09 AM 06/18/2023    8:31 PM  CBC  WBC 4.0 - 10.5 K/uL 6.4  6.0  6.6   Hemoglobin 12.0 - 15.0 g/dL 9.1  9.0  9.8   Hematocrit 36.0 - 46.0 % 29.7  29.3  33.1   Platelets 150 - 400 K/uL 162  168  22    A/P 82 year old with CKD IIIb, dementia and declining functional status of late presents after syncopal episode at home and is admitted for unstable v-tach s/p shock x 2, found to have severe AKI with uremia for which nephrology was consulted.  AKI on CKD Ischemic ATN in setting of volume depletion, loop diuretics, and concomitant RAAS inhibition. Much improved with IV fluids, creatinine nearing baseline today. Tolerating IV fluids well, recommend continuing. Seems to  have good output based on frequency of incontinence. Continue holding ACE and torsemide.  Uremia Improving. Mental status stable. No indication for RRT.  V-tach Less ectopy it appears on monitor today, perhaps a result of clearing BUN. Transitioned to p.o. amiodarone. Cardiology following.  Hypovolemia Improving. Hypo- to euvolemic on exam today.  Hypernatremia Improved after hypotonic fluids yesterday.  UTI Urine with GNR x 60,000 cfu. Ceftriaxone per primary.  Final recommendations pending attending attestation.  Marrianne Mood MD 06/20/2023, 11:17 AM

## 2023-06-20 NOTE — Evaluation (Signed)
Occupational Therapy Evaluation Patient Details Name: Laura Atkins MRN: [id number] DOB: 03/22/1941 Today's Date: 06/20/2023   History of Present Illness 82 year old who presented to [facility] ER 9/23 after losing consciousness at home.  While in the ER her systolic blood pressures dropped into the 50s with a heart rate as low as 28 and she was found to be in VT. PMH of dementia, depression/anxiety, insomnia, DM2, HTN, and CKD stage IIIb   Clinical Impression   Pt admitted for above and presents with impaired cognition consistent with her prior hx of dementia. Pt needs more time to follow commands and needs significant assist with OOB mobility, she also shows decreased initiation and sequence skills to complete ADLs. Discussed with family and they are in agreeance that pt would have better success at returning to baseline if going to SNF. Pt would benefit from continued acute skilled OT services to address listed deficits and help transition to next level of care. Patient would benefit from post acute skilled rehab facility with <3 hours of therapy and 24/7 support       If plan is discharge home, recommend the following: A lot of help with walking and/or transfers;A lot of help with bathing/dressing/bathroom;Assistance with cooking/housework;Supervision due to cognitive status;Direct supervision/assist for financial management;Help with stairs or ramp for entrance;Direct supervision/assist for medications management;Assistance with feeding    Functional Status Assessment  Patient has had a recent decline in their functional status and demonstrates the ability to make significant improvements in function in a reasonable and predictable amount of time.  Equipment Recommendations  None recommended by OT (TBD at next level of care)    Recommendations for Other Services       Precautions / Restrictions Precautions Precautions: Fall Restrictions Weight Bearing Restrictions: No       Mobility Bed Mobility Overal bed mobility: Needs Assistance Bed Mobility: Supine to Sit, Sit to Supine     Supine to sit: Mod assist, Used rails, HOB elevated Sit to supine: Max assist   General bed mobility comments: Cues for pt to assist by using be rails    Transfers Overall transfer level: Needs assistance Equipment used: 1 person hand held assist Transfers: Sit to/from Stand Sit to Stand: Max assist           General transfer comment: Lateral side steps at bedside, cues for upright posture, pt standing a total of 4 mins while NT and family assist with clean up an linens      Balance Overall balance assessment: Needs assistance Sitting-balance support: Feet supported Sitting balance-Leahy Scale: Fair Sitting balance - Comments: CGA, would stay by her side as she has a hx of sliding off surfaces     Standing balance-Leahy Scale: Zero Standing balance comment: Max A from OT                           ADL either performed or assessed with clinical judgement   ADL Overall ADL's : Needs assistance/impaired Eating/Feeding: Bed level;Set up   Grooming: Sitting;Minimal assistance;Cueing for sequencing   Upper Body Bathing: Sitting;Cueing for sequencing;Minimal assistance Upper Body Bathing Details (indicate cue type and reason): cueing for initiation Lower Body Bathing: Moderate assistance;Sitting/lateral leans   Upper Body Dressing : Sitting;Minimal assistance   Lower Body Dressing: Sitting/lateral leans;Maximal assistance;Sit to/from stand;+2 for safety/equipment Lower Body Dressing Details (indicate cue type and reason): Mod A for socks while sitting EOB, Max A +2 to don briefs due to  one person needing to [facility] Max A standing balance Toilet Transfer: Maximal assistance;Stand-pivot Toilet Transfer Details (indicate cue type and reason): based on STS and lateral steps Toileting- Clothing Manipulation and Hygiene: Total assistance                Vision         Perception         Praxis         Pertinent Vitals/Pain Pain Assessment Pain Assessment: Faces Faces Pain Scale: No hurt     Extremity/Trunk Assessment Upper Extremity Assessment Upper Extremity Assessment: Generalized weakness   Lower Extremity Assessment Lower Extremity Assessment: Generalized weakness       Communication Communication Communication: No apparent difficulties Cueing Techniques: Verbal cues;Tactile cues   Cognition Arousal: Alert Behavior During Therapy: WFL for tasks assessed/performed Overall Cognitive Status: History of cognitive impairments - at baseline                                 General Comments: Hx of dementia at baseline, overall very pleasant and jocile today. Slow processsing, decreased iniation, impaired STM and safety awareness as well     General Comments  VSS on RA, HR up to 80s with activity    Exercises     Shoulder Instructions      Home Living Family/patient expects to be discharged to:: Skilled nursing facility                                 Additional Comments: 2 falls in the last 5 weeks, slid out of bed and slid off toilet.      Prior Functioning/Environment Prior Level of Function : History of Falls (last six months);Needs assist             Mobility Comments: Family assists with pivots to wheelchair, rare RW use. been like that for about 6 months ADLs Comments: Family assist with ADLs and IADLs, needs assist with food setup        OT Problem List: Decreased strength;Impaired balance (sitting and/or standing)      OT Treatment/Interventions: Self-care/ADL training;Balance training;Therapeutic exercise;Therapeutic activities;Patient/family education    OT Goals(Current goals can be found in the care plan section) Acute Rehab OT Goals Patient Stated Goal: To go to rehab OT Goal Formulation: With family Time For Goal Achievement: 07/04/23 Potential to  Achieve Goals: Good ADL Goals Pt Will Perform Grooming: with contact guard assist;sitting Pt Will Perform Lower Body Bathing: sitting/lateral leans;with contact guard assist Pt Will Perform Lower Body Dressing: sitting/lateral leans;with contact guard assist Pt Will Transfer to Toilet: bedside commode;stand pivot transfer;with mod assist  OT Frequency: Min 1X/week    Co-evaluation              AM-PAC OT "6 Clicks" Daily Activity     Outcome Measure Help from another person eating meals?: A Little Help from another person taking care of personal grooming?: A Little Help from another person toileting, which includes using toliet, bedpan, or urinal?: A Lot Help from another person bathing (including washing, rinsing, drying)?: A Lot Help from another person to put on and taking off regular upper body clothing?: A Little Help from another person to put on and taking off regular lower body clothing?: A Lot 6 Click Score: 15   End of Session Nurse Communication: Mobility status  Activity Tolerance: Patient  tolerated treatment well Patient left: in bed;with call bell/phone within reach;with family/visitor present;with bed alarm set  OT Visit Diagnosis: Unsteadiness on feet (R26.81);Other abnormalities of gait and mobility (R26.89);History of falling (Z91.81);Muscle weakness (generalized) (M62.81)                Time: 5784-6962 OT Time Calculation (min): 27 min Charges:  OT General Charges $OT Visit: 1 Visit OT Evaluation $OT Eval Moderate Complexity: 1 Mod OT Treatments $Therapeutic Activity: 8-22 mins  06/20/2023  AB, OTR/L  Acute Rehabilitation Services  Office: [phone]   Tristan Schroeder 06/20/2023, 6:07 PM

## 2023-06-20 NOTE — TOC Initial Note (Signed)
Transition of Care [facility]) - Initial/Assessment Note    Patient Details  Name: Laura Atkins MRN: [id number] Date of Birth: October 15, 1940  Transition of Care [facility]) CM/SW Contact:    Gala Lewandowsky, RN Phone Number: 06/20/2023, 4:03 PM  Clinical Narrative: Patient presented for  loss of consciousness. PTA patient was from home with daughter. Daughter states patient has DME in the home: wheelchair and rolling walker. Daughter feels that the patient would benefit from SNF at discharge. Case Manager asked for PT/OT consult. Case Manager will continue to follow for transition of care needs.                 Expected Discharge Plan: Skilled Nursing Facility Barriers to Discharge: Continued Medical Work up   Patient Goals and CMS Choice Patient states their goals for this hospitalization and ongoing recovery are:: daughter feels that patient will benefit from SNF  Expected Discharge Plan and Services   Discharge Planning Services: CM Consult Post Acute Care Choice: Skilled Nursing Facility Living arrangements for the past 2 months: Single Family Home                   DME Agency: NA  Prior Living Arrangements/Services Living arrangements for the past 2 months: Single Family Home Lives with:: Adult Children Patient language and need for interpreter reviewed:: Yes        Need for Family Participation in Patient Care: Yes (Comment) Care giver support system in place?: Yes (comment) Current home services: DME (patient has wheelchair and rolloing walker.) Criminal Activity/Legal Involvement Pertinent to Current Situation/Hospitalization: No - Comment as needed  Activities of Daily Living   ADL Screening (condition at time of admission) Does the patient have a NEW difficulty with bathing/dressing/toileting/self-feeding that is expected to last >3 days?: No Does the patient have a NEW difficulty with getting in/out of bed, walking, or climbing stairs that is expected to last >3  days?: No Does the patient have a NEW difficulty with communication that is expected to last >3 days?: No Is the patient deaf or have difficulty hearing?: No Does the patient have difficulty seeing, even when wearing glasses/contacts?: No Does the patient have difficulty concentrating, remembering, or making decisions?: Yes  Permission Sought/Granted Permission sought to share information with : Case Manager, Family Supports Permission granted to share information with : Yes, Verbal Permission Granted   Emotional Assessment Appearance:: Appears stated age       Alcohol / Substance Use: Not Applicable Psych Involvement: No (comment)  Admission diagnosis:  Ventricular tachycardia (HCC) [I47.20] Patient Active Problem List   Diagnosis Date Noted   Ventricular tachycardia (HCC) 06/18/2023   Prolonged QT interval 06/18/2023   Uremia 06/18/2023   At high risk for falls 05/12/2023   Osteomyelitis (HCC) 12/18/2022   Hypokalemia 12/18/2022   Heart failure with preserved ejection fraction (HCC) 12/18/2022   Chronic kidney disease, stage III (moderate) (HCC) 12/18/2022   Obesity (BMI 30-39.9) 12/18/2022   Anemia 10/29/2022   Anemia due to chronic kidney disease 08/23/2022   Insomnia 08/14/2022   Bradycardia 08/14/2022   Abnormal liver CT 03/22/2022   Melena 01/23/2022   Change in stool caliber 01/23/2022   Iron deficiency anemia due to chronic blood loss 01/23/2022   Lower abdominal pain 12/01/2021   Rectal pain 10/24/2021   Urge incontinence 10/10/2021   Polyneuropathy associated with underlying disease (HCC) 01/06/2021   Chronic kidney disease, stage 4 (severe) (HCC) 01/06/2021   Dementia (HCC) 07/13/2020   Cognitive deficits  Acute renal failure superimposed on stage 3b chronic kidney disease (HCC) 05/27/2020   Carotid bruit 05/26/2020   Hyperlipidemia associated with type 2 diabetes mellitus (HCC) 12/08/2019   Left knee pain 02/11/2017   Vitamin D deficiency 10/15/2016    Urinary incontinence 10/15/2016   Limited mobility 05/27/2016   Type 2 diabetes mellitus with neurological complications (HCC) 02/04/2016   Sleep terror disorder 12/14/2015   Fecal incontinence 12/13/2015   Back pain of lumbar region with sciatica 04/15/2015   Spondylosis, cervical, with myelopathy 04/14/2015   Bilateral leg edema 12/13/2014   PAD (peripheral artery disease) (HCC) 01/28/2014   OSA (obstructive sleep apnea) 04/10/2013   Peripheral neuropathy 03/12/2013   UTI (urinary tract infection) 01/18/2013   Hearing loss 11/06/2012   Seizure-like activity (HCC) 01/25/2012   UNSTEADY GAIT 11/07/2010   Morbid obesity (HCC) 04/21/2008   Anxiety and depression 04/21/2008   Essential hypertension 04/21/2008   GERD 04/21/2008   Osteoarthritis of left knee 04/21/2008   PCP:  Kerri Perches, MD Pharmacy:   [facility] - [place], [place] - [address] [place] [address] Phone: [phone] Fax: [phone]  Social Determinants of Health (SDOH) Social History: SDOH Screenings   Food Insecurity: No Food Insecurity (06/19/2023)  Housing: Low Risk  (06/19/2023)  Transportation Needs: No Transportation Needs (06/19/2023)  Utilities: Not At Risk (06/19/2023)  Alcohol Screen: Low Risk  (04/18/2022)  Depression (PHQ2-9): Medium Risk (05/07/2023)  Financial Resource Strain: Low Risk  (04/01/2023)   Received from [facility]  Physical Activity: Inactive (04/18/2022)  Social Connections: Moderately Integrated (04/18/2022)  Stress: No Stress Concern Present (04/18/2022)  Tobacco Use: Low Risk  (06/18/2023)   Readmission Risk Interventions    06/20/2023    4:01 PM  Readmission Risk Prevention Plan  Transportation Screening Complete  HRI or Home Care Consult Complete  Social Work Consult for Recovery Care Planning/Counseling Complete  Palliative Care Screening Not Applicable  Medication Review Oceanographer) Referral to Pharmacy

## 2023-06-20 NOTE — Progress Notes (Addendum)
   Patient Name: Laura Atkins Date of Encounter: 06/20/2023 [facility] Cardiologist: Dina Rich, MD   Interval Summary  .    Patient pleasantly confused on exam today with her daughter also present in the room. Denies acute complaints this morning.   Vital Signs .    Vitals:   06/19/23 1408 06/19/23 1923 06/20/23 0458 06/20/23 0500  BP:  (!) 131/46 (!) 122/52   Pulse: 69 72 67   Resp: 18 18 20    Temp: 98.3 F (36.8 C) 98.5 F (36.9 C) 98.7 F (37.1 C)   TempSrc: Oral Oral Oral   SpO2:  100% 94%   Weight:    91.1 kg    Intake/Output Summary (Last 24 hours) at 06/20/2023 0743 Last data filed at 06/19/2023 2125 Gross per 24 hour  Intake 179.97 ml  Output --  Net 179.97 ml      06/20/2023    5:00 AM 06/19/2023    5:07 AM 05/23/2023   12:56 PM  Last 3 Weights  Weight (lbs) 200 lb 13.4 oz 198 lb 6.6 oz --  Weight (kg) 91.1 kg 90 kg --      Telemetry/ECG    Sinus rhythm with frequent PVCs and occasional trigeminy - Personally Reviewed  Physical Exam .   GEN: No acute distress.   Neck: No JVD Cardiac: irregular with PVCs, no murmurs, rubs, or gallops.  Respiratory: Clear to auscultation bilaterally. GI: Soft, nontender, non-distended  MS: No edema  Assessment & Plan .     Ventricular tachycardia PVCs  Patient transferred to [facility] from OSH after occurrence of VT arrest with EMS as well as in the ED at OSH. She received defibrillation x2. Given inability to view tracings, full picture of patient's VT not clear. Patient now on amiodarone with no recurrent VT.   As articulated by Dr. Royann Shivers yesterday, possibilities include iatrogenic QT prolongation from home medications, bradycardia causing long QT. Would recommend avoiding use of Seroquel. TTE without significant changes from prior, LVEF 50-55% with out major LVH. Given significant co-morbidities, would like to avoid invasive medical procedures/evaluation. Continue Amiodarone 200mg  daily. Repeat ECG  today to monitor Qtc. Maintain K>4, Mg>2 Would plan for monitor at d/c  Hyperlipidemia  Lipitor 40mg  daily  AKI on CKD IIIb  Creatinine up to 6.01 with acute illness though recent baseline near 1.4. Improved to 2.01 today.  Management per nephrology  Per primary team: DM Dementia Depression Anxiety  For questions or updates, please contact [facility] Please consult [email or web address] for contact info under        Signed, Perlie Gold, PA-C   I have seen and examined the patient along with Perlie Gold, PA-C.  I have reviewed the chart, notes and new data.  I agree with PA/NP's note.  Key new complaints: feels well Key examination changes:  Key new findings / data: improving renal parameters, continues to have a very high prevalence of monomorphic PVCs, but no VT. ECG today NSR with mildly prolonged QTc 486 ms. Same PVCs are seen in 2023, QTc 500 ms. In 2022 QTC 458 ms.  PLAN: Short term amiodarone while we clarify risk of recurrent arrhythmia with an OP monitor. Avoid use of other QT prolonging meds (would avoid seroquel and would not use any higher dose of trazodone). Avoid macrolides and quinolones.  Thurmon Fair, MD, [facility] [phone] 06/20/2023, 10:35 AM

## 2023-06-20 NOTE — Progress Notes (Signed)
Laura Atkins  WJX:[id number] DOB: [birth year] DOA: 06/18/2023 PCP: Kerri Perches, MD    Brief Narrative:  82 year old with a history of dementia, depression/anxiety, insomnia, DM2, HTN, and CKD stage IIIb who presented to [facility] ER 9/23 after losing consciousness at home.  While in the ER her systolic blood pressures dropped into the 50s with a heart rate as low as 28 and she was found to be in VT.  She converted to sinus rhythm after 1 shock.  She was found to have a BUN of 168 and a creatinine of 6.  Goals of Care:   Code Status: Full Code   DVT prophylaxis: heparin injection 5,000 Units Start: 06/18/23 2200   Interim Hx: No acute events recorded overnight.  Afebrile.  Vital signs stable.  Creatinine and BUN continue to rapidly improved.  In good spirits today with no acute complaints.  Assessment & Plan:  Unstable ventricular tachycardia Converted to sinus rhythm after shock x 2 episodes -care per cardiology -continue amiodarone - avoiding meds that may have impact on QT, to include seroquel -TTE stable from prior exams with EF 50-55% -no plans for further invasive medical procedures for this diagnosis -Cardiology plans for outpatient monitor at time of discharge  Acute kidney failure on CKD stage IIIb Creatinine 6.01 at presentation with BUN of 168 - rapidly improving with hydration  Recent Labs  Lab 06/18/23 2031 06/19/23 0409 06/20/23 0443  CREATININE 3.17* 2.83* 2.01*     Abnormal UA POA Empiric Rocephin -following urine culture  Dementia with depression/anxiety and insomnia Appears well compensated presently  HTN Blood pressure well-controlled  HLD Lipitor 40 mg daily  DM2 A1c 6.5 -CBG well-controlled  Family Communication:  Disposition: Unclear at present -PT/OT to evaluate -suspect SNF rehab stay will be necessary   Objective: Blood pressure (!) 122/52, pulse 67, temperature 98.7 F (37.1 C), temperature source Oral, resp. rate 20,  weight 91.1 kg, SpO2 94%.  Intake/Output Summary (Last 24 hours) at 06/20/2023 0956 Last data filed at 06/19/2023 2125 Gross per 24 hour  Intake 179.97 ml  Output --  Net 179.97 ml   Filed Weights   06/19/23 0507 06/20/23 0500  Weight: 90 kg 91.1 kg    Examination: General: No acute respiratory distress Lungs: Clear to auscultation bilaterally without wheezes or crackles Cardiovascular: Regular rate and rhythm  Abdomen: Nontender, nondistended, soft, bowel sounds positive, no rebound Extremities: Trace edema bilateral lower extremities  CBC: Recent Labs  Lab 06/18/23 2031 06/19/23 0409 06/20/23 0443  WBC 6.6 6.0 6.4  HGB 9.8* 9.0* 9.1*  HCT 33.1* 29.3* 29.7*  MCV 71.6* 72.3* 73.0*  PLT 193 168 162   Basic Metabolic Panel: Recent Labs  Lab 06/18/23 2031 06/19/23 0409 06/20/23 0443  NA 143 148* 143  K 4.2 4.3 4.0  CL 110 110 113*  CO2 24 23 24   GLUCOSE 119* 119* 112*  BUN 127* 112* 69*  CREATININE 3.17* 2.83* 2.01*  CALCIUM 9.3 9.5 8.9  MG 1.8  --  1.6*  PHOS 4.1  --   --    GFR: Estimated Creatinine Clearance: 26.8 mL/min (A) (by C-G formula based on SCr of 2.01 mg/dL (H)).   Scheduled Meds:  amiodarone  200 mg Oral Daily   aspirin EC  81 mg Oral Daily   atorvastatin  40 mg Oral Daily   buPROPion  150 mg Oral q morning   cyanocobalamin  5,000 mcg Oral Daily   gabapentin  300 mg Oral QHS  heparin  5,000 Units Subcutaneous Q8H   insulin aspart  0-5 Units Subcutaneous QHS   insulin aspart  0-6 Units Subcutaneous TID WC   sodium chloride flush  3 mL Intravenous Q12H   Continuous Infusions:  sodium chloride 125 mL/hr at 06/19/23 1945   cefTRIAXone (ROCEPHIN)  IV Stopped (06/19/23 2139)     LOS: 2 days   Lonia Blood, MD [facility] Office  [phone] Pager - Text Page per Loretha Stapler  If 7PM-7AM, please contact night-coverage per Amion 06/20/2023, 9:56 AM

## 2023-06-20 NOTE — Progress Notes (Signed)
Dark stool this AM. With smell, suspect potential GI bleed.

## 2023-06-21 ENCOUNTER — Inpatient Hospital Stay (HOSPITAL_COMMUNITY)
Admit: 2023-06-21 | Discharge: 2023-06-21 | Disposition: A | Discharge status: Home / Self Care | Payer: Medicare PPO | Attending: Cardiology

## 2023-06-21 ENCOUNTER — Other Ambulatory Visit: Payer: Self-pay | Admitting: Cardiology

## 2023-06-21 DIAGNOSIS — I472 Ventricular tachycardia, unspecified: Secondary | ICD-10-CM | POA: Diagnosis not present

## 2023-06-21 LAB — BASIC METABOLIC PANEL
Anion gap: 9 (ref 5–15)
BUN: 42 mg/dL — ABNORMAL HIGH (ref 8–23)
CO2: 23 mmol/L (ref 22–32)
Calcium: 8.8 mg/dL — ABNORMAL LOW (ref 8.9–10.3)
Chloride: 114 mmol/L — ABNORMAL HIGH (ref 98–111)
Creatinine, Ser: 1.63 mg/dL — ABNORMAL HIGH (ref 0.44–1.00)
GFR, Estimated: 32 mL/min — ABNORMAL LOW (ref >60)
Glucose, Bld: 141 mg/dL — ABNORMAL HIGH (ref 70–99)
Potassium: 4.6 mmol/L (ref 3.5–5.1)
Sodium: 146 mmol/L — ABNORMAL HIGH (ref 135–145)

## 2023-06-21 LAB — GLUCOSE, CAPILLARY
Glucose-Capillary: 118 mg/dL — ABNORMAL HIGH (ref 70–99)
Glucose-Capillary: 115 mg/dL — ABNORMAL HIGH (ref 70–99)
Glucose-Capillary: 119 mg/dL — ABNORMAL HIGH (ref 70–99)
Glucose-Capillary: 103 mg/dL — ABNORMAL HIGH (ref 70–99)

## 2023-06-21 LAB — URINE CULTURE

## 2023-06-21 MED ORDER — VITAMIN B-12 1000 MCG PO TABS
1000.0000 ug | ORAL_TABLET | Freq: Every day | ORAL | Status: DC
  Administered 2023-06-22: 1000 ug via ORAL
  Filled 2023-06-21: qty 1

## 2023-06-21 MED ORDER — CEPHALEXIN 500 MG PO CAPS
500.0000 mg | ORAL_CAPSULE | Freq: Two times a day (BID) | ORAL | Status: DC
  Administered 2023-06-21 – 2023-06-22 (×3): 500 mg via ORAL
  Filled 2023-06-21 (×3): qty 1

## 2023-06-21 MED ORDER — CEPHALEXIN 500 MG PO CAPS: 500.0000 mg | ORAL_CAPSULE | Freq: Two times a day (BID) | ORAL | Status: DC

## 2023-06-21 NOTE — Plan of Care (Signed)
  Problem: Education: Goal: Knowledge of General Education information will improve Description: Including pain rating scale, medication(s)/side effects and non-pharmacologic comfort measures Outcome: Progressing   Problem: Health Behavior/Discharge Planning: Goal: Ability to manage health-related needs will improve Outcome: Progressing   Problem: Clinical Measurements: Goal: Ability to maintain clinical measurements within normal limits will improve Outcome: Progressing Goal: Will remain free from infection Outcome: Progressing Goal: Diagnostic test results will improve Outcome: Progressing Goal: Respiratory complications will improve Outcome: Progressing Goal: Cardiovascular complication will be avoided Outcome: Progressing   Problem: Activity: Goal: Risk for activity intolerance will decrease Outcome: Progressing   Problem: Nutrition: Goal: Adequate nutrition will be maintained Outcome: Progressing   Problem: Coping: Goal: Level of anxiety will decrease Outcome: Progressing   Problem: Elimination: Goal: Will not experience complications related to bowel motility Outcome: Progressing Goal: Will not experience complications related to urinary retention Outcome: Progressing   Problem: Pain Managment: Goal: General experience of comfort will improve Outcome: Progressing   Problem: Safety: Goal: Ability to remain free from injury will improve Outcome: Progressing   Problem: Skin Integrity: Goal: Risk for impaired skin integrity will decrease Outcome: Progressing   Problem: Education: Goal: Ability to describe self-care measures that may prevent or decrease complications (Diabetes Survival Skills Education) will improve Outcome: Progressing Goal: Individualized Educational Video(s) Outcome: Progressing   Problem: Coping: Goal: Ability to adjust to condition or change in health will improve Outcome: Progressing   Problem: Fluid Volume: Goal: Ability to  maintain a balanced intake and output will improve Outcome: Progressing   Problem: Health Behavior/Discharge Planning: Goal: Ability to identify and utilize available resources and services will improve Outcome: Progressing Goal: Ability to manage health-related needs will improve Outcome: Progressing   Problem: Metabolic: Goal: Ability to maintain appropriate glucose levels will improve Outcome: Progressing   Problem: Nutritional: Goal: Maintenance of adequate nutrition will improve Outcome: Progressing Goal: Progress toward achieving an optimal weight will improve Outcome: Progressing   Problem: Skin Integrity: Goal: Risk for impaired skin integrity will decrease Outcome: Progressing   Problem: Tissue Perfusion: Goal: Adequacy of tissue perfusion will improve Outcome: Progressing   Problem: Education: Goal: Knowledge of General Education information will improve Description: Including pain rating scale, medication(s)/side effects and non-pharmacologic comfort measures Outcome: Progressing   Problem: Health Behavior/Discharge Planning: Goal: Ability to manage health-related needs will improve Outcome: Progressing   Problem: Clinical Measurements: Goal: Ability to maintain clinical measurements within normal limits will improve Outcome: Progressing Goal: Will remain free from infection Outcome: Progressing Goal: Diagnostic test results will improve Outcome: Progressing Goal: Respiratory complications will improve Outcome: Progressing Goal: Cardiovascular complication will be avoided Outcome: Progressing   Problem: Activity: Goal: Risk for activity intolerance will decrease Outcome: Progressing   Problem: Nutrition: Goal: Adequate nutrition will be maintained Outcome: Progressing   Problem: Coping: Goal: Level of anxiety will decrease Outcome: Progressing   Problem: Elimination: Goal: Will not experience complications related to bowel motility Outcome:  Progressing Goal: Will not experience complications related to urinary retention Outcome: Progressing   Problem: Pain Managment: Goal: General experience of comfort will improve Outcome: Progressing   Problem: Safety: Goal: Ability to remain free from injury will improve Outcome: Progressing   Problem: Skin Integrity: Goal: Risk for impaired skin integrity will decrease Outcome: Progressing   

## 2023-06-21 NOTE — Plan of Care (Signed)

## 2023-06-21 NOTE — [place] FL2 (Signed)
[place] MEDICAID FL2 LEVEL OF CARE FORM     IDENTIFICATION  Patient Name: Laura Atkins Birthdate: 19-Jan-1941 Sex: female Admission Date (Current Location): 06/18/2023  [facility] and [place] Number:  Producer, television/film/video and Address:  [facility] [place], [address] [place], [place] [address]      Provider Number: [id number]  Attending Physician Name and Address:  Lonia Blood, MD  Relative Name and Phone Number:  Jasmine December daughter [phone]    Current Level of Care: Hospital Recommended Level of Care: Skilled Nursing Facility Prior Approval Number:    Date Approved/Denied:   PASRR Number: [id number]  Discharge Plan: SNF    Current Diagnoses: Patient Active Problem List   Diagnosis Date Noted   Ventricular tachycardia (HCC) 06/18/2023   Prolonged QT interval 06/18/2023   Uremia 06/18/2023   At high risk for falls 05/12/2023   Osteomyelitis (HCC) 12/18/2022   Hypokalemia 12/18/2022   Heart failure with preserved ejection fraction (HCC) 12/18/2022   Chronic kidney disease, stage III (moderate) (HCC) 12/18/2022   Obesity (BMI 30-39.9) 12/18/2022   Anemia 10/29/2022   Anemia due to chronic kidney disease 08/23/2022   Insomnia 08/14/2022   Bradycardia 08/14/2022   Abnormal liver CT 03/22/2022   Melena 01/23/2022   Change in stool caliber 01/23/2022   Iron deficiency anemia due to chronic blood loss 01/23/2022   Lower abdominal pain 12/01/2021   Rectal pain 10/24/2021   Urge incontinence 10/10/2021   Polyneuropathy associated with underlying disease (HCC) 01/06/2021   Chronic kidney disease, stage 4 (severe) (HCC) 01/06/2021   Dementia (HCC) 07/13/2020   Cognitive deficits    Acute renal failure superimposed on stage 3b chronic kidney disease (HCC) 05/27/2020   Carotid bruit 05/26/2020   Hyperlipidemia associated with type 2 diabetes mellitus (HCC) 12/08/2019   Left knee pain 02/11/2017   Vitamin D deficiency 10/15/2016   Urinary  incontinence 10/15/2016   Limited mobility 05/27/2016   Type 2 diabetes mellitus with neurological complications (HCC) 02/04/2016   Sleep terror disorder 12/14/2015   Fecal incontinence 12/13/2015   Back pain of lumbar region with sciatica 04/15/2015   Spondylosis, cervical, with myelopathy 04/14/2015   Bilateral leg edema 12/13/2014   PAD (peripheral artery disease) (HCC) 01/28/2014   OSA (obstructive sleep apnea) 04/10/2013   Peripheral neuropathy 03/12/2013   UTI (urinary tract infection) 01/18/2013   Hearing loss 11/06/2012   Seizure-like activity (HCC) 01/25/2012   UNSTEADY GAIT 11/07/2010   Morbid obesity (HCC) 04/21/2008   Anxiety and depression 04/21/2008   Essential hypertension 04/21/2008   GERD 04/21/2008   Osteoarthritis of left knee 04/21/2008    Orientation RESPIRATION BLADDER Height & Weight     Self, Place  Normal Incontinent Weight: 200 lb (90.7 kg) Height:     BEHAVIORAL SYMPTOMS/MOOD NEUROLOGICAL BOWEL NUTRITION STATUS      Incontinent Diet (Please see discharge summary)  AMBULATORY STATUS COMMUNICATION OF NEEDS Skin   Extensive Assist Verbally Other (Comment) (WDL,Wound/Incision LDAs)                       Personal Care Assistance Level of Assistance  Bathing, Feeding, Dressing Bathing Assistance: Maximum assistance Feeding assistance: Limited assistance Dressing Assistance: Maximum assistance     Functional Limitations Info  Sight, Hearing, Speech Sight Info: Impaired (Eyeglasses) Hearing Info: Adequate Speech Info: Adequate    SPECIAL CARE FACTORS FREQUENCY  PT (By licensed PT), OT (By licensed OT)     PT Frequency: 5x  min weekly OT Frequency: 5x min weekly            Contractures Contractures Info: Not present    Additional Factors Info  Code Status, Allergies, Psychotropic, Insulin Sliding Scale Code Status Info: FULL Allergies Info: Penicillins,Prednisone,Propoxyphene N-acetaminophen,Gentamicin,Sulfa Antibiotics Psychotropic  Info: buPROPion (WELLBUTRIN XL) 24 hr tablet 150 mg every morning Insulin Sliding Scale Info: insulin aspart (novoLOG) injection 0-5 Units daily at bedtime,insulin aspart (novoLOG) injection 0-6 Units 3 times daily with meals       Current Medications (06/21/2023):  This is the current hospital active medication list Current Facility-Administered Medications  Medication Dose Route Frequency Provider Last Rate Last Admin   0.45 % sodium chloride infusion   Intravenous Continuous Lonia Blood, MD 50 mL/hr at 06/21/23 0953 Rate Change at 06/21/23 0953   acetaminophen (TYLENOL) tablet 650 mg  650 mg Oral Q6H PRN Lonia Blood, MD   650 mg at 06/20/23 2327   amiodarone (PACERONE) tablet 200 mg  200 mg Oral Daily Croitoru, Mihai, MD   200 mg at 06/21/23 0919   aspirin EC tablet 81 mg  81 mg Oral Daily Opyd, Lavone Neri, MD   81 mg at 06/21/23 0918   atorvastatin (LIPITOR) tablet 40 mg  40 mg Oral Daily Opyd, Lavone Neri, MD   40 mg at 06/21/23 0919   buPROPion (WELLBUTRIN XL) 24 hr tablet 150 mg  150 mg Oral q morning Jetty Duhamel T, MD   150 mg at 06/21/23 0918   cephALEXin (KEFLEX) capsule 500 mg  500 mg Oral Q12H Jetty Duhamel T, MD   500 mg at 06/21/23 1018   [START ON 06/22/2023] cyanocobalamin (VITAMIN B12) tablet 1,000 mcg  1,000 mcg Oral Daily Lonia Blood, MD       gabapentin (NEURONTIN) capsule 300 mg  300 mg Oral QHS Opyd, Lavone Neri, MD   300 mg at 06/20/23 2200   heparin injection 5,000 Units  5,000 Units Subcutaneous Q8H Opyd, Lavone Neri, MD   5,000 Units at 06/21/23 1427   insulin aspart (novoLOG) injection 0-5 Units  0-5 Units Subcutaneous QHS Opyd, Lavone Neri, MD       insulin aspart (novoLOG) injection 0-6 Units  0-6 Units Subcutaneous TID WC Opyd, Lavone Neri, MD   2 Units at 06/19/23 1320   sodium chloride flush (NS) 0.9 % injection 3 mL  3 mL Intravenous Q12H Opyd, Lavone Neri, MD   3 mL at 06/20/23 8119     Discharge Medications: Please see discharge summary for a  list of discharge medications.  Relevant Imaging Results:  Relevant Lab Results:   Additional Information [id number]  Delilah Shan, LCSWA

## 2023-06-21 NOTE — Care Management Important Message (Signed)
Important Message  Patient Details  Name: Laura Atkins MRN: [id number] Date of Birth: August 21, 1941   Important Message Given:  Yes - Medicare IM     Sherilyn Banker 06/21/2023, 10:30 AM

## 2023-06-21 NOTE — Progress Notes (Signed)
No significant arrhythmia. Fewer PVCs overall. No CV complaints. [facility] will sign off.   Medication Recommendations:  Amiodarone 200 mg daily. Stop Seroquel Other recommendations (labs, testing, etc):  Will arrange a 14-day live event monitor (MCOT). Avoid QT prolonging drugs Follow up as an outpatient:  06/25/2023 w Dr. Dominga Ferry

## 2023-06-21 NOTE — TOC Initial Note (Addendum)
Transition of Care [facility]) - Initial/Assessment Note    Patient Details  Name: Laura Atkins MRN: [id number] Date of Birth: 1940/12/09  [facility] CM/SW Contact:    Delilah Shan, LCSWA Phone Number: 06/21/2023, 3:21 PM  Clinical Narrative:                  CSW received consult for possible SNF placement at time of discharge. Due to patients current orientation CSW spoke with patients daughter regarding PT recommendation of SNF placement at time of discharge. Patients daughter expressed understanding of PT recommendation and is agreeable to SNF placement for patient at time of discharge. Patients daughter gave CSW permission to fax out initial referral for SNF placement. CSW discussed insurance authorization process.No further questions reported at this time. CSW to continue to follow and assist with discharge planning needs.   Update- CSW started insurance authorization for patient. Auth #[id number]. CSW provided SNF bed offers to patients daughter. Patients daughter accepted SNF bed offer with [facility] rehab. CSW added facility choice to patients insurance authorization. Insurance authorization currently pending.  Expected Discharge Plan: Skilled Nursing Facility Barriers to Discharge: Continued Medical Work up   Patient Goals and CMS Choice Patient states their goals for this hospitalization and ongoing recovery are:: daughter feels that patient will benefit from SNF   Choice offered to / list presented to : Adult Children (daughter)      Expected Discharge Plan and Services In-house Referral: Clinical Social Work Discharge Planning Services: CM Consult Post Acute Care Choice: Skilled Nursing Facility Living arrangements for the past 2 months: Single Family Home                   DME Agency: NA                  Prior Living Arrangements/Services Living arrangements for the past 2 months: Single Family Home Lives with:: Adult Children, Spouse  (Daughter,Grandchild,and spouse) Patient language and need for interpreter reviewed:: Yes Do you feel safe going back to the place where you live?: No   SNF  Need for Family Participation in Patient Care: Yes (Comment) Care giver support system in place?: Yes (comment) Current home services: DME (patient has wheelchair and rolloing walker.) Criminal Activity/Legal Involvement Pertinent to Current Situation/Hospitalization: No - Comment as needed  Activities of Daily Living   ADL Screening (condition at time of admission) Does the patient have a NEW difficulty with bathing/dressing/toileting/self-feeding that is expected to last >3 days?: No Does the patient have a NEW difficulty with getting in/out of bed, walking, or climbing stairs that is expected to last >3 days?: No Does the patient have a NEW difficulty with communication that is expected to last >3 days?: No Is the patient deaf or have difficulty hearing?: No Does the patient have difficulty seeing, even when wearing glasses/contacts?: No Does the patient have difficulty concentrating, remembering, or making decisions?: Yes  Permission Sought/Granted Permission sought to share information with : Case Manager, Family Supports, Magazine features editor Permission granted to share information with : Yes, Verbal Permission Granted  Share Information with NAME: Jasmine December  Permission granted to share info w AGENCY: SNF  Permission granted to share info w Relationship: daughter  Permission granted to share info w Contact Information: Jasmine December [phone]  Emotional Assessment Appearance:: Appears stated age     Orientation: : Oriented to Self, Oriented to Place Alcohol / Substance Use: Not Applicable Psych Involvement: No (comment)  Admission diagnosis:  Ventricular tachycardia (  HCC) [I47.20] Patient Active Problem List   Diagnosis Date Noted   Ventricular tachycardia (HCC) 06/18/2023   Prolonged QT interval 06/18/2023    Uremia 06/18/2023   At high risk for falls 05/12/2023   Osteomyelitis (HCC) 12/18/2022   Hypokalemia 12/18/2022   Heart failure with preserved ejection fraction (HCC) 12/18/2022   Chronic kidney disease, stage III (moderate) (HCC) 12/18/2022   Obesity (BMI 30-39.9) 12/18/2022   Anemia 10/29/2022   Anemia due to chronic kidney disease 08/23/2022   Insomnia 08/14/2022   Bradycardia 08/14/2022   Abnormal liver CT 03/22/2022   Melena 01/23/2022   Change in stool caliber 01/23/2022   Iron deficiency anemia due to chronic blood loss 01/23/2022   Lower abdominal pain 12/01/2021   Rectal pain 10/24/2021   Urge incontinence 10/10/2021   Polyneuropathy associated with underlying disease (HCC) 01/06/2021   Chronic kidney disease, stage 4 (severe) (HCC) 01/06/2021   Dementia (HCC) 07/13/2020   Cognitive deficits    Acute renal failure superimposed on stage 3b chronic kidney disease (HCC) 05/27/2020   Carotid bruit 05/26/2020   Hyperlipidemia associated with type 2 diabetes mellitus (HCC) 12/08/2019   Left knee pain 02/11/2017   Vitamin D deficiency 10/15/2016   Urinary incontinence 10/15/2016   Limited mobility 05/27/2016   Type 2 diabetes mellitus with neurological complications (HCC) 02/04/2016   Sleep terror disorder 12/14/2015   Fecal incontinence 12/13/2015   Back pain of lumbar region with sciatica 04/15/2015   Spondylosis, cervical, with myelopathy 04/14/2015   Bilateral leg edema 12/13/2014   PAD (peripheral artery disease) (HCC) 01/28/2014   OSA (obstructive sleep apnea) 04/10/2013   Peripheral neuropathy 03/12/2013   UTI (urinary tract infection) 01/18/2013   Hearing loss 11/06/2012   Seizure-like activity (HCC) 01/25/2012   UNSTEADY GAIT 11/07/2010   Morbid obesity (HCC) 04/21/2008   Anxiety and depression 04/21/2008   Essential hypertension 04/21/2008   GERD 04/21/2008   Osteoarthritis of left knee 04/21/2008   PCP:  Kerri Perches, MD Pharmacy:   [facility]  - [place], [place] - [address] [place] [address] Phone: [phone] Fax: [phone]     Social Determinants of Health (SDOH) Social History: SDOH Screenings   Food Insecurity: No Food Insecurity (06/19/2023)  Housing: Low Risk  (06/19/2023)  Transportation Needs: No Transportation Needs (06/19/2023)  Utilities: Not At Risk (06/19/2023)  Alcohol Screen: Low Risk  (04/18/2022)  Depression (PHQ2-9): Medium Risk (05/07/2023)  Financial Resource Strain: Low Risk  (04/01/2023)   Received from [facility] Care  Physical Activity: Inactive (04/18/2022)  Social Connections: Moderately Integrated (04/18/2022)  Stress: No Stress Concern Present (04/18/2022)  Tobacco Use: Low Risk  (06/18/2023)   SDOH Interventions:     Readmission Risk Interventions    06/20/2023    4:01 PM  Readmission Risk Prevention Plan  Transportation Screening Complete  HRI or Home Care Consult Complete  Social Work Consult for Recovery Care Planning/Counseling Complete  Palliative Care Screening Not Applicable  Medication Review Oceanographer) Referral to Pharmacy

## 2023-06-21 NOTE — Evaluation (Signed)
Physical Therapy Evaluation Patient Details Name: Laura Atkins MRN: [id number] DOB: Nov 07, 1940 Today's Date: 06/21/2023  History of Present Illness  82 year old who presented to [facility] ER 9/23 after losing consciousness at home due to [facility]. S/p defibrillation x2, plan for continued  medical management. PMH of dementia, depression/anxiety, insomnia, DM2, HTN, and CKD stage IIIb  Clinical Impression   Pt presents with generalized weakness, impaired balance, impaired activity tolerance. Pt to benefit from acute PT to address deficits. Pt requiring moderate physical assist for repeated transfers into standing and pivot to/from chair, pt limited by LE weakness and fatigue. PT also assisting with pericare, as pt heavily soiled in urine. Patient will benefit from continued inpatient follow up therapy, <3 hours/day.  PT to progress mobility as tolerated, and will continue to follow acutely.      vss    If plan is discharge home, recommend the following: A lot of help with walking and/or transfers;A lot of help with bathing/dressing/bathroom;Direct supervision/assist for financial management;Direct supervision/assist for medications management;Assist for transportation;Help with stairs or ramp for entrance;Supervision due to cognitive status   Can travel by private vehicle   No    Equipment Recommendations None recommended by PT  Recommendations for Other Services       Functional Status Assessment Patient has had a recent decline in their functional status and demonstrates the ability to make significant improvements in function in a reasonable and predictable amount of time.     Precautions / Restrictions Precautions Precautions: Fall Restrictions Weight Bearing Restrictions: No      Mobility  Bed Mobility Overal bed mobility: Needs Assistance Bed Mobility: Supine to Sit, Sit to Supine     Supine to sit: Min assist Sit to supine: Mod assist   General bed mobility  comments: assist for completion of trunk elevation, trunk lowering, and LE lift into bed.    Transfers Overall transfer level: Needs assistance Equipment used: 1 person hand held assist Transfers: Sit to/from Stand, Bed to chair/wheelchair/BSC Sit to Stand: Mod assist Stand pivot transfers: Mod assist         General transfer comment: assist for power up, rise, steadying. stand x3, from EOB x1 and chair x2 for pericare. scooting EOB with light assist for boosting buttocks    Ambulation/Gait               General Gait Details: unable this date  Stairs            Wheelchair Mobility     Tilt Bed    Modified Rankin (Stroke Patients Only)       Balance Overall balance assessment: Needs assistance Sitting-balance support: Feet supported Sitting balance-Leahy Scale: Fair Sitting balance - Comments: able to sit EOB without PT support     Standing balance-Leahy Scale: Poor Standing balance comment: reliant on PT assist and external UE support (bedrails, chair armrests)                             Pertinent Vitals/Pain Pain Assessment Pain Assessment: No/denies pain    Home Living Family/patient expects to be discharged to:: Skilled nursing facility                   Additional Comments: 2 falls in the last 5 weeks, slid out of bed and slid off toilet.    Prior Function Prior Level of Function : History of Falls (last six months);Needs assist  Mobility Comments: pt has been using w/c via stand pivot with assist of family, occasionally gets up with RW to get to/from bathroom ADLs Comments: Family assist with ADLs and IADLs, needs assist with food setup     Extremity/Trunk Assessment   Upper Extremity Assessment Upper Extremity Assessment: Generalized weakness    Lower Extremity Assessment Lower Extremity Assessment: Generalized weakness    Cervical / Trunk Assessment Cervical / Trunk Assessment: Normal   Communication   Communication Communication: No apparent difficulties  Cognition Arousal: Alert Behavior During Therapy: WFL for tasks assessed/performed Overall Cognitive Status: History of cognitive impairments - at baseline                                 General Comments: history of dementia, pleasant and cooperative throughout session. Max one-step cues needed for mobility        General Comments General comments (skin integrity, edema, etc.): pt found heavily soiled in urine with brief donned, per pt's oldest daughter this is family preference vs purewick stating "the purewick gives her UTIs". PT did relay to pt and family that pt is at high risk for skin breakdown in briefs, pt's family still requests don a brief at end of session    Exercises     Assessment/Plan    PT Assessment Patient needs continued PT services  PT Problem List Decreased strength;Decreased mobility;Decreased activity tolerance;Decreased balance;Decreased knowledge of use of DME;Pain;Decreased cognition;Decreased safety awareness;Cardiopulmonary status limiting activity       PT Treatment Interventions DME instruction;Therapeutic activities;Gait training;Therapeutic exercise;Patient/family education;Balance training;Stair training;Functional mobility training;Neuromuscular re-education    PT Goals (Current goals can be found in the Care Plan section)  Acute Rehab PT Goals PT Goal Formulation: With patient Time For Goal Achievement: 07/05/23 Potential to Achieve Goals: Good    Frequency Min 1X/week     Co-evaluation               AM-PAC PT "6 Clicks" Mobility  Outcome Measure Help needed turning from your back to your side while in a flat bed without using bedrails?: A Little Help needed moving from lying on your back to sitting on the side of a flat bed without using bedrails?: A Little Help needed moving to and from a bed to a chair (including a wheelchair)?: A Lot Help  needed standing up from a chair using your arms (e.g., wheelchair or bedside chair)?: A Lot Help needed to walk in hospital room?: Total Help needed climbing 3-5 steps with a railing? : Total 6 Click Score: 12    End of Session Equipment Utilized During Treatment: Gait belt Activity Tolerance: Patient tolerated treatment well Patient left: in bed;with call bell/phone within reach;with bed alarm set;with family/visitor present Nurse Communication: Mobility status PT Visit Diagnosis: Other abnormalities of gait and mobility (R26.89);Muscle weakness (generalized) (M62.81)    Time: 1914-7829 PT Time Calculation (min) (ACUTE ONLY): 28 min   Charges:   PT Evaluation $PT Eval Low Complexity: 1 Low PT Treatments $Therapeutic Activity: 8-22 mins PT General Charges $$ ACUTE PT VISIT: 1 Visit         Marye Round, PT DPT Acute Rehabilitation Services Secure Chat Preferred  Office [phone]    Miriam Kestler Sheliah Plane 06/21/2023, 2:39 PM

## 2023-06-21 NOTE — Progress Notes (Signed)
Laura Atkins  WUJ:[id number] DOB: [birth year] DOA: 06/18/2023 PCP: Kerri Perches, MD    Brief Narrative:  82 year old with a history of dementia, depression/anxiety, insomnia, DM2, HTN, and CKD stage IIIb who presented to [facility] ER 9/23 after losing consciousness at home.  While in the ER her systolic blood pressures dropped into the 50s with a heart rate as low as 28 and she was found to be in VT.  She converted to sinus rhythm after 1 shock.  She was found to have a BUN of 168 and a creatinine of 6.  Goals of Care:   Code Status: Full Code   DVT prophylaxis: heparin injection 5,000 Units Start: 06/18/23 2200  Interim Hx: Continues to improve.  Afebrile.  Vital signs stable.  No acute events reported since my last visit.  No further arrhythmias appreciated on telemetry.  Alert and conversant though mildly confused at the time of my evaluation.  Family reports she became disoriented last night and was a challenge to keep in bed.  Assessment & Plan:  Unstable ventricular tachycardia Converted to sinus rhythm after shock x 2 episodes -care per Cardiology -continue amiodarone at 200 mg daily - avoid meds that may have impact on QT, to include seroquel -TTE stable from prior exams with EF 50-55% -no plans for further invasive medical procedures for this diagnosis - Cardiology to arrange for outpatient monitor at time of discharge  Acute kidney failure on CKD stage IIIb Creatinine 6.01 at presentation with BUN of 168 - rapidly improving with hydration  Recent Labs  Lab 06/18/23 2031 06/19/23 0409 06/20/23 0443  CREATININE 3.17* 2.83* 2.01*     Abnormal UA POA Has been treated with empiric Rocephin -urine culture reveals pansensitive Citrobacter species -transition to oral antibiotics to complete 5 days of treatment total  Dementia with depression/anxiety and insomnia Appears well compensated presently  HTN Blood pressure well-controlled  HLD Lipitor 40 mg  daily  DM2 A1c 6.5 -CBG well-controlled  Family Communication: Spoke with one of the patient's daughters at bedside Disposition: Medically ready for discharge as soon as SNF bed is procured   Objective: Blood pressure (!) 147/60, pulse 66, temperature 97.8 F (36.6 C), temperature source Oral, resp. rate 18, weight 90.7 kg, SpO2 99%.  Intake/Output Summary (Last 24 hours) at 06/21/2023 1647 Last data filed at 06/21/2023 0919 Gross per 24 hour  Intake 1441.94 ml  Output --  Net 1441.94 ml   Filed Weights   06/19/23 0507 06/20/23 0500 06/21/23 0442  Weight: 90 kg 91.1 kg 90.7 kg    Examination: General: No acute respiratory distress Lungs: Clear to auscultation bilaterally -no wheezing Cardiovascular: Regular rate and rhythm  Abdomen: Nontender, nondistended, soft, bowel sounds positive, no rebound Extremities: Trace edema B LE without change  CBC: Recent Labs  Lab 06/18/23 2031 06/19/23 0409 06/20/23 0443  WBC 6.6 6.0 6.4  HGB 9.8* 9.0* 9.1*  HCT 33.1* 29.3* 29.7*  MCV 71.6* 72.3* 73.0*  PLT 193 168 162   Basic Metabolic Panel: Recent Labs  Lab 06/18/23 2031 06/19/23 0409 06/20/23 0443  NA 143 148* 143  K 4.2 4.3 4.0  CL 110 110 113*  CO2 24 23 24   GLUCOSE 119* 119* 112*  BUN 127* 112* 69*  CREATININE 3.17* 2.83* 2.01*  CALCIUM 9.3 9.5 8.9  MG 1.8  --  1.6*  PHOS 4.1  --   --    GFR: Estimated Creatinine Clearance: 26.7 mL/min (A) (by C-G formula based on SCr  of 2.01 mg/dL (H)).   Scheduled Meds:  amiodarone  200 mg Oral Daily   aspirin EC  81 mg Oral Daily   atorvastatin  40 mg Oral Daily   buPROPion  150 mg Oral q morning   cephALEXin  500 mg Oral Q12H   [START ON 06/22/2023] cyanocobalamin  1,000 mcg Oral Daily   gabapentin  300 mg Oral QHS   heparin  5,000 Units Subcutaneous Q8H   insulin aspart  0-5 Units Subcutaneous QHS   insulin aspart  0-6 Units Subcutaneous TID WC   sodium chloride flush  3 mL Intravenous Q12H   Continuous  Infusions:  sodium chloride 50 mL/hr at 06/21/23 0953     LOS: 3 days   Lonia Blood, MD [facility] Office  [phone] Pager - Text Page per Loretha Stapler  If 7PM-7AM, please contact night-coverage per Amion 06/21/2023, 4:47 PM

## 2023-06-22 DIAGNOSIS — F0394 Unspecified dementia, unspecified severity, with anxiety: Secondary | ICD-10-CM | POA: Diagnosis not present

## 2023-06-22 DIAGNOSIS — N189 Chronic kidney disease, unspecified: Secondary | ICD-10-CM | POA: Diagnosis not present

## 2023-06-22 DIAGNOSIS — G4700 Insomnia, unspecified: Secondary | ICD-10-CM | POA: Diagnosis not present

## 2023-06-22 DIAGNOSIS — E559 Vitamin D deficiency, unspecified: Secondary | ICD-10-CM | POA: Diagnosis not present

## 2023-06-22 DIAGNOSIS — R5381 Other malaise: Secondary | ICD-10-CM | POA: Diagnosis not present

## 2023-06-22 DIAGNOSIS — E1169 Type 2 diabetes mellitus with other specified complication: Secondary | ICD-10-CM | POA: Diagnosis not present

## 2023-06-22 DIAGNOSIS — G309 Alzheimer's disease, unspecified: Secondary | ICD-10-CM | POA: Diagnosis not present

## 2023-06-22 DIAGNOSIS — K219 Gastro-esophageal reflux disease without esophagitis: Secondary | ICD-10-CM | POA: Diagnosis not present

## 2023-06-22 DIAGNOSIS — R600 Localized edema: Secondary | ICD-10-CM | POA: Diagnosis not present

## 2023-06-22 DIAGNOSIS — R531 Weakness: Secondary | ICD-10-CM | POA: Diagnosis not present

## 2023-06-22 DIAGNOSIS — Z9181 History of falling: Secondary | ICD-10-CM | POA: Diagnosis not present

## 2023-06-22 DIAGNOSIS — F0280 Dementia in other diseases classified elsewhere without behavioral disturbance: Secondary | ICD-10-CM | POA: Diagnosis not present

## 2023-06-22 DIAGNOSIS — F32A Depression, unspecified: Secondary | ICD-10-CM | POA: Diagnosis not present

## 2023-06-22 DIAGNOSIS — R000 Tachycardia, unspecified: Secondary | ICD-10-CM | POA: Diagnosis not present

## 2023-06-22 DIAGNOSIS — Z09 Encounter for follow-up examination after completed treatment for conditions other than malignant neoplasm: Secondary | ICD-10-CM | POA: Diagnosis not present

## 2023-06-22 DIAGNOSIS — I4719 Other supraventricular tachycardia: Secondary | ICD-10-CM | POA: Diagnosis not present

## 2023-06-22 DIAGNOSIS — R269 Unspecified abnormalities of gait and mobility: Secondary | ICD-10-CM | POA: Diagnosis not present

## 2023-06-22 DIAGNOSIS — K921 Melena: Secondary | ICD-10-CM | POA: Diagnosis not present

## 2023-06-22 DIAGNOSIS — I499 Cardiac arrhythmia, unspecified: Secondary | ICD-10-CM | POA: Diagnosis not present

## 2023-06-22 DIAGNOSIS — N1832 Chronic kidney disease, stage 3b: Secondary | ICD-10-CM | POA: Diagnosis not present

## 2023-06-22 DIAGNOSIS — N390 Urinary tract infection, site not specified: Secondary | ICD-10-CM | POA: Diagnosis not present

## 2023-06-22 DIAGNOSIS — M810 Age-related osteoporosis without current pathological fracture: Secondary | ICD-10-CM | POA: Diagnosis not present

## 2023-06-22 DIAGNOSIS — Z7401 Bed confinement status: Secondary | ICD-10-CM | POA: Diagnosis not present

## 2023-06-22 DIAGNOSIS — F0390 Unspecified dementia without behavioral disturbance: Secondary | ICD-10-CM | POA: Diagnosis not present

## 2023-06-22 DIAGNOSIS — F03B3 Unspecified dementia, moderate, with mood disturbance: Secondary | ICD-10-CM | POA: Diagnosis not present

## 2023-06-22 DIAGNOSIS — R55 Syncope and collapse: Secondary | ICD-10-CM | POA: Diagnosis not present

## 2023-06-22 DIAGNOSIS — Z23 Encounter for immunization: Secondary | ICD-10-CM | POA: Diagnosis not present

## 2023-06-22 DIAGNOSIS — R569 Unspecified convulsions: Secondary | ICD-10-CM | POA: Diagnosis not present

## 2023-06-22 DIAGNOSIS — K649 Unspecified hemorrhoids: Secondary | ICD-10-CM | POA: Diagnosis not present

## 2023-06-22 DIAGNOSIS — I10 Essential (primary) hypertension: Secondary | ICD-10-CM | POA: Diagnosis not present

## 2023-06-22 DIAGNOSIS — E1149 Type 2 diabetes mellitus with other diabetic neurological complication: Secondary | ICD-10-CM | POA: Diagnosis not present

## 2023-06-22 DIAGNOSIS — E119 Type 2 diabetes mellitus without complications: Secondary | ICD-10-CM | POA: Diagnosis not present

## 2023-06-22 DIAGNOSIS — M5440 Lumbago with sciatica, unspecified side: Secondary | ICD-10-CM | POA: Diagnosis not present

## 2023-06-22 DIAGNOSIS — D649 Anemia, unspecified: Secondary | ICD-10-CM | POA: Diagnosis not present

## 2023-06-22 DIAGNOSIS — I4581 Long QT syndrome: Secondary | ICD-10-CM | POA: Diagnosis not present

## 2023-06-22 DIAGNOSIS — I472 Ventricular tachycardia, unspecified: Secondary | ICD-10-CM | POA: Diagnosis not present

## 2023-06-22 DIAGNOSIS — E876 Hypokalemia: Secondary | ICD-10-CM | POA: Diagnosis not present

## 2023-06-22 DIAGNOSIS — D509 Iron deficiency anemia, unspecified: Secondary | ICD-10-CM | POA: Diagnosis present

## 2023-06-22 DIAGNOSIS — E785 Hyperlipidemia, unspecified: Secondary | ICD-10-CM | POA: Diagnosis not present

## 2023-06-22 DIAGNOSIS — N179 Acute kidney failure, unspecified: Secondary | ICD-10-CM | POA: Diagnosis not present

## 2023-06-22 LAB — BASIC METABOLIC PANEL
Anion gap: 7 (ref 5–15)
BUN: 34 mg/dL — ABNORMAL HIGH (ref 8–23)
CO2: 25 mmol/L (ref 22–32)
Calcium: 9.1 mg/dL (ref 8.9–10.3)
Chloride: 114 mmol/L — ABNORMAL HIGH (ref 98–111)
Creatinine, Ser: 1.51 mg/dL — ABNORMAL HIGH (ref 0.44–1.00)
GFR, Estimated: 35 mL/min — ABNORMAL LOW (ref >60)
Glucose, Bld: 101 mg/dL — ABNORMAL HIGH (ref 70–99)
Potassium: 3.4 mmol/L — ABNORMAL LOW (ref 3.5–5.1)
Sodium: 146 mmol/L — ABNORMAL HIGH (ref 135–145)

## 2023-06-22 LAB — CBC
HCT: 29.1 % — ABNORMAL LOW (ref 36.0–46.0)
Hemoglobin: 8.5 g/dL — ABNORMAL LOW (ref 12.0–15.0)
MCH: 21.6 pg — ABNORMAL LOW (ref 26.0–34.0)
MCHC: 29.2 g/dL — ABNORMAL LOW (ref 30.0–36.0)
MCV: 74 fL — ABNORMAL LOW (ref 80.0–100.0)
Platelets: 150 10*3/uL (ref 150–400)
RBC: 3.93 MIL/uL (ref 3.87–5.11)
RDW: 17.2 % — ABNORMAL HIGH (ref 11.5–15.5)
WBC: 4.9 10*3/uL (ref 4.0–10.5)
nRBC: 0 % (ref 0.0–0.2)

## 2023-06-22 LAB — GLUCOSE, CAPILLARY
Glucose-Capillary: 96 mg/dL (ref 70–99)
Glucose-Capillary: 137 mg/dL — ABNORMAL HIGH (ref 70–99)

## 2023-06-22 LAB — MAGNESIUM: Magnesium: 1.7 mg/dL (ref 1.7–2.4)

## 2023-06-22 MED ORDER — CYANOCOBALAMIN 1000 MCG PO TABS: 1000.0000 ug | ORAL_TABLET | Freq: Every day | ORAL | Status: DC

## 2023-06-22 MED ORDER — AMIODARONE HCL 200 MG PO TABS: 200.0000 mg | ORAL_TABLET | Freq: Every day | ORAL | Status: DC

## 2023-06-22 MED ORDER — INSULIN ASPART 100 UNIT/ML IJ SOLN: 0.0000 [IU] | Freq: Three times a day (TID) | INTRAMUSCULAR | Status: DC

## 2023-06-22 MED ORDER — ACETAMINOPHEN 325 MG PO TABS: 650.0000 mg | ORAL_TABLET | Freq: Four times a day (QID) | ORAL | Status: AC | PRN

## 2023-06-22 MED ORDER — CEPHALEXIN 500 MG PO CAPS: 500.0000 mg | ORAL_CAPSULE | Freq: Two times a day (BID) | ORAL | Status: DC

## 2023-06-22 MED ORDER — GABAPENTIN 300 MG PO CAPS: 300.0000 mg | ORAL_CAPSULE | Freq: Every day | ORAL | Status: DC

## 2023-06-22 MED ORDER — ENSURE ENLIVE PO LIQD
237.0000 mL | Freq: Two times a day (BID) | ORAL | Status: DC
  Administered 2023-06-22: 237 mL via ORAL

## 2023-06-22 MED ORDER — POTASSIUM CHLORIDE CRYS ER 20 MEQ PO TBCR
40.0000 meq | EXTENDED_RELEASE_TABLET | Freq: Once | ORAL | Status: AC
  Administered 2023-06-22: 40 meq via ORAL
  Filled 2023-06-22: qty 2

## 2023-06-22 MED ORDER — INSULIN ASPART 100 UNIT/ML IJ SOLN: 0.0000 [IU] | Freq: Every day | INTRAMUSCULAR | Status: DC

## 2023-06-22 NOTE — Progress Notes (Addendum)
Attempted to call report to [facility]. [phone] was called, the number rang without an answer or mailbox for voicemail to be left. Please contact the nurse at [phone] for questions.   Case management made aware as well.

## 2023-06-22 NOTE — Discharge Summary (Addendum)
DISCHARGE SUMMARY  Laura Atkins  MR#: [id number]  DOB:Jun 22, 1941  Date of Admission: 06/18/2023 Date of Discharge: 06/22/2023  Attending Physician:Yahmir Sokolov Silvestre Gunner, MD  Patient's WUX:[id number], [name], MD  Consults: Cardiology, Nephrology  Disposition: D/C to SNF for rehab stay   Follow-up Appts:  Contact information for after-discharge care     Destination     [facility] .   Service: Skilled Nursing Contact information: [address] [place] [address] [phone]                     Tests Needing Follow-up: -recheck renal function in 5-7 days to assure has fully normalized  -monitor CBGs  -monitor BP as adjustment in medication regimen may be necessary as renal function and intake continue to improve   Discharge Diagnoses: Unstable ventricular tachycardia Acute kidney failure with ATN on CKD stage IIIb UTI POA Dementia with depression/anxiety and insomnia HTN HLD DM2  Initial presentation: 82 year old with a history of dementia, depression/anxiety, insomnia, DM2, HTN, and CKD stage IIIb who presented to [facility] ER 9/23 after losing consciousness at home. While in the ER her systolic blood pressures dropped into the 50s with a heart rate as low as 28 and she was found to be in VT. She converted to sinus rhythm after 1 shock. She was found to have a BUN of 168 and a creatinine of 6.   Hospital Course:  Unstable ventricular tachycardia Converted to sinus rhythm after shock x 2 episodes -care per Cardiology -continue amiodarone at 200 mg daily - avoid meds that may have impact on QT, to include seroquel -TTE stable from prior exams with EF 50-55% -no plans for further invasive medical procedures for this diagnosis - Cardiology to arrange for outpatient monitor after discharge   Acute kidney failure with ATN on CKD stage IIIb Creatinine 6.01 at presentation with BUN of 168 - rapidly improved  with hydration and nearly fully normalized at time of discharge  UTI POA was treated with empiric Rocephin initially - urine culture revealed pansensitive Citrobacter species -transitioned to oral antibiotics to complete 5 days of treatment total   Dementia with depression/anxiety and insomnia Appears well compensated at time of discharge   HTN Blood pressure well-controlled   HLD Lipitor 40 mg daily   DM2 A1c 6.5 -CBG well-controlled      Allergies as of 06/22/2023       Reactions   Penicillins Shortness Of Breath, Itching, Rash   Prednisone Shortness Of Breath, Itching, Rash   Propoxyphene N-acetaminophen Itching, Nausea And Vomiting   Darvocet    Gentamicin Rash   Cream   Sulfa Antibiotics Itching, Nausea And Vomiting        Medication List     STOP taking these medications    Accu-Chek FastClix Lancets Misc   Accu-Chek Guide Me w/Device Kit   Accu-Chek Guide test strip Generic drug: glucose blood   amLODipine 2.5 MG tablet Commonly known as: NORVASC   B-D SINGLE USE SWABS REGULAR Pads   benazepril 20 MG tablet Commonly known as: LOTENSIN   diphenhydrAMINE 25 MG tablet Commonly known as: BENADRYL   donepezil 10 MG tablet Commonly known as: ARICEPT   Easy Touch Pen Needles 31G X 5 MM Misc Generic drug: Insulin Pen Needle   ferrous sulfate 325 (65 FE) MG EC tablet   glucose blood test strip   INSULIN SYRINGE 1CC/31GX5/16" 31G X 5/16" 1 ML Misc   metolazone 5 MG  tablet Commonly known as: ZAROXOLYN   mirtazapine 15 MG tablet Commonly known as: REMERON   NONFORMULARY OR COMPOUNDED ITEM   omeprazole 20 MG capsule Commonly known as: PRILOSEC   QUEtiapine 100 MG tablet Commonly known as: SEROquel   sertraline 100 MG tablet Commonly known as: ZOLOFT   torsemide 20 MG tablet Commonly known as: DEMADEX   Tranquility Undergarments Misc   traZODone 50 MG tablet Commonly known as: DESYREL   Tresiba FlexTouch 200 UNIT/ML FlexTouch  Pen Generic drug: insulin degludec   Vitamin B-12 5000 MCG Tbdp Replaced by: cyanocobalamin 1000 MCG tablet       TAKE these medications    acetaminophen 325 MG tablet Commonly known as: TYLENOL Take 2 tablets (650 mg total) by mouth every 6 (six) hours as needed for mild pain, fever or headache.   amiodarone 200 MG tablet Commonly known as: PACERONE Take 1 tablet (200 mg total) by mouth daily. Start taking on: June 23, 2023   aspirin 81 MG tablet Take 81 mg by mouth daily.   atorvastatin 40 MG tablet Commonly known as: LIPITOR TAKE 1 TABLET BY MOUTH EVERY DAY   buPROPion 150 MG 24 hr tablet Commonly known as: WELLBUTRIN XL TAKE 1 TABLET BY MOUTH EVERY MORNING   cephALEXin 500 MG capsule Commonly known as: KEFLEX Take 1 capsule (500 mg total) by mouth every 12 (twelve) hours.   Cholecalciferol 125 MCG (5000 UT) Tabs Take 5,000 Units by mouth daily.   cyanocobalamin 1000 MCG tablet Take 1 tablet (1,000 mcg total) by mouth daily. Start taking on: June 23, 2023 Replaces: Vitamin B-12 5000 MCG Tbdp   gabapentin 300 MG capsule Commonly known as: NEURONTIN Take 1 capsule (300 mg total) by mouth at bedtime. What changed:  medication strength how much to take when to take this   insulin aspart 100 UNIT/ML injection Commonly known as: novoLOG Inject 0-5 Units into the skin at bedtime.   insulin aspart 100 UNIT/ML injection Commonly known as: novoLOG Inject 0-6 Units into the skin 3 (three) times daily with meals.   multivitamin capsule Take 1 capsule by mouth daily.        Day of Discharge BP 130/63 (BP Location: Left Arm)   Pulse 63   Temp 98.2 F (36.8 C) (Oral)   Resp 20   Wt 90.7 kg   SpO2 100%   BMI 30.41 kg/m   Physical Exam: General: No acute respiratory distress Lungs: Clear to auscultation bilaterally without wheezes or crackles Cardiovascular: Regular rate and rhythm without murmur gallop or rub normal S1 and S2 Abdomen:  Nontender, nondistended, soft, bowel sounds positive, no rebound, no ascites, no appreciable mass Extremities: No significant cyanosis, clubbing, or edema bilateral lower extremities  Basic Metabolic Panel: Recent Labs  Lab 06/18/23 2031 06/19/23 0409 06/20/23 0443 06/21/23 1526 06/22/23 0431  NA 143 148* 143 146* 146*  K 4.2 4.3 4.0 4.6 3.4*  CL 110 110 113* 114* 114*  CO2 24 23 24 23 25   GLUCOSE 119* 119* 112* 141* 101*  BUN 127* 112* 69* 42* 34*  CREATININE 3.17* 2.83* 2.01* 1.63* 1.51*  CALCIUM 9.3 9.5 8.9 8.8* 9.1  MG 1.8  --  1.6*  --  1.7  PHOS 4.1  --   --   --   --     CBC: Recent Labs  Lab 06/18/23 2031 06/19/23 0409 06/20/23 0443 06/22/23 0431  WBC 6.6 6.0 6.4 4.9  HGB 9.8* 9.0* 9.1* 8.5*  HCT 33.1* 29.3* 29.7*  29.1*  MCV 71.6* 72.3* 73.0* 74.0*  PLT 193 168 162 150    Time spent in discharge (includes decision making & examination of pt): 35 minutes  06/22/2023, 9:50 AM   Lonia Blood, MD [facility] Office  [phone]

## 2023-06-22 NOTE — Plan of Care (Signed)
  Problem: Education: Goal: Knowledge of General Education information will improve Description: Including pain rating scale, medication(s)/side effects and non-pharmacologic comfort measures Outcome: Progressing   Problem: Clinical Measurements: Goal: Ability to maintain clinical measurements within normal limits will improve Outcome: Progressing Goal: Will remain free from infection Outcome: Progressing Goal: Respiratory complications will improve Outcome: Progressing Goal: Cardiovascular complication will be avoided Outcome: Progressing   Problem: Elimination: Goal: Will not experience complications related to bowel motility Outcome: Progressing   Problem: Pain Managment: Goal: General experience of comfort will improve Outcome: Progressing   Problem: Activity: Goal: Risk for activity intolerance will decrease Outcome: Not Progressing

## 2023-06-22 NOTE — Plan of Care (Signed)
  Problem: Education: Goal: Knowledge of General Education information will improve Description: Including pain rating scale, medication(s)/side effects and non-pharmacologic comfort measures Outcome: Progressing   Problem: Health Behavior/Discharge Planning: Goal: Ability to manage health-related needs will improve Outcome: Progressing   Problem: Clinical Measurements: Goal: Ability to maintain clinical measurements within normal limits will improve Outcome: Progressing Goal: Will remain free from infection Outcome: Progressing Goal: Diagnostic test results will improve Outcome: Progressing Goal: Respiratory complications will improve Outcome: Progressing Goal: Cardiovascular complication will be avoided Outcome: Progressing   Problem: Activity: Goal: Risk for activity intolerance will decrease Outcome: Progressing   Problem: Nutrition: Goal: Adequate nutrition will be maintained Outcome: Progressing   Problem: Coping: Goal: Level of anxiety will decrease Outcome: Progressing   Problem: Elimination: Goal: Will not experience complications related to bowel motility Outcome: Progressing Goal: Will not experience complications related to urinary retention Outcome: Progressing   Problem: Pain Managment: Goal: General experience of comfort will improve Outcome: Progressing   Problem: Safety: Goal: Ability to remain free from injury will improve Outcome: Progressing   Problem: Skin Integrity: Goal: Risk for impaired skin integrity will decrease Outcome: Progressing   Problem: Education: Goal: Ability to describe self-care measures that may prevent or decrease complications (Diabetes Survival Skills Education) will improve Outcome: Progressing Goal: Individualized Educational Video(s) Outcome: Progressing   Problem: Coping: Goal: Ability to adjust to condition or change in health will improve Outcome: Progressing   Problem: Fluid Volume: Goal: Ability to  maintain a balanced intake and output will improve Outcome: Progressing   Problem: Health Behavior/Discharge Planning: Goal: Ability to identify and utilize available resources and services will improve Outcome: Progressing Goal: Ability to manage health-related needs will improve Outcome: Progressing   Problem: Metabolic: Goal: Ability to maintain appropriate glucose levels will improve Outcome: Progressing   Problem: Nutritional: Goal: Maintenance of adequate nutrition will improve Outcome: Progressing Goal: Progress toward achieving an optimal weight will improve Outcome: Progressing   Problem: Skin Integrity: Goal: Risk for impaired skin integrity will decrease Outcome: Progressing   Problem: Tissue Perfusion: Goal: Adequacy of tissue perfusion will improve Outcome: Progressing   Problem: Education: Goal: Knowledge of General Education information will improve Description: Including pain rating scale, medication(s)/side effects and non-pharmacologic comfort measures Outcome: Progressing   Problem: Health Behavior/Discharge Planning: Goal: Ability to manage health-related needs will improve Outcome: Progressing   Problem: Clinical Measurements: Goal: Ability to maintain clinical measurements within normal limits will improve Outcome: Progressing Goal: Will remain free from infection Outcome: Progressing Goal: Diagnostic test results will improve Outcome: Progressing Goal: Respiratory complications will improve Outcome: Progressing Goal: Cardiovascular complication will be avoided Outcome: Progressing   Problem: Activity: Goal: Risk for activity intolerance will decrease Outcome: Progressing   Problem: Nutrition: Goal: Adequate nutrition will be maintained Outcome: Progressing   Problem: Coping: Goal: Level of anxiety will decrease Outcome: Progressing   Problem: Elimination: Goal: Will not experience complications related to bowel motility Outcome:  Progressing Goal: Will not experience complications related to urinary retention Outcome: Progressing   Problem: Pain Managment: Goal: General experience of comfort will improve Outcome: Progressing   Problem: Safety: Goal: Ability to remain free from injury will improve Outcome: Progressing   Problem: Skin Integrity: Goal: Risk for impaired skin integrity will decrease Outcome: Progressing   

## 2023-06-22 NOTE — TOC Transition Note (Addendum)
Transition of Care [facility]) - CM/SW Discharge Note   Patient Details  Name: Laura Atkins MRN: [id number] Date of Birth: 1940/10/19  Transition of Care [facility]) CM/SW Contact:  Patrice Paradise, LCSW Phone Number: 06/22/2023, 10:42 AM   Clinical Narrative:    Patient will DC to:?[facility] Anticipated DC date:?06/22/2023 Family notified:?Sharon Transport by: Sharin Mons   Per MD patient ready for DC to [facility].. RN, patient, patient's family, and facility notified of DC. Discharge Summary sent to facility. RN given number for report   [phone] room 508-b. DC packet on chart. Ambulance transport requested for patient.   CSW signing off.   Judd Lien, [place] [phone]    Final next level of care: Skilled Nursing Facility Barriers to Discharge: Barriers Resolved   Patient Goals and CMS Choice   Choice offered to / list presented to : Adult Children (daughter)  Discharge Placement                Patient [facility] Patient to be transferred to facility by: [facility] Name of family member notified: Jasmine December Patient and family notified of of transfer: 06/22/23  Discharge Plan and Services Additional resources added to the After Visit Summary for   In-house Referral: Clinical Social Work Discharge Planning Services: CM Consult Post Acute Care Choice: Skilled Nursing Facility            DME Agency: NA                  Social Determinants of Health (SDOH) Interventions SDOH Screenings   Food Insecurity: No Food Insecurity (06/19/2023)  Housing: Low Risk  (06/19/2023)  Transportation Needs: No Transportation Needs (06/19/2023)  Utilities: Not At Risk (06/19/2023)  Alcohol Screen: Low Risk  (04/18/2022)  Depression (PHQ2-9): Medium Risk (05/07/2023)  Financial Resource Strain: Low Risk  (04/01/2023)   Received from [facility]  Physical Activity: Inactive (04/18/2022)  Social Connections: Moderately Integrated (04/18/2022)  Stress:  No Stress Concern Present (04/18/2022)  Tobacco Use: Low Risk  (06/18/2023)     Readmission Risk Interventions    06/20/2023    4:01 PM  Readmission Risk Prevention Plan  Transportation Screening Complete  HRI or Home Care Consult Complete  Social Work Consult for Recovery Care Planning/Counseling Complete  Palliative Care Screening Not Applicable  Medication Review Oceanographer) Referral to Pharmacy

## 2023-06-22 NOTE — TOC Progression Note (Addendum)
Transition of Care [facility]) - Progression Note    Patient Details  Name: Laura Atkins MRN: [id number] Date of Birth: 1940-11-02  [facility] Care [facility]) CM/SW Contact  Patrice Paradise, LCSW Phone Number: 06/22/2023, 9:11 AM  Clinical Narrative:    Patient's Berkley Harvey is approved.  [employer] #- [id number] Health Plan #- [id number] Authorization dates: 9/28-10/01  [facility] team will continue to assist with discharge planning needs.   Expected Discharge Plan: Skilled Nursing Facility Barriers to Discharge: Continued Medical Work up  Expected Discharge Plan and Services In-house Referral: Clinical Social Work Discharge Planning Services: CM Consult Post Acute Care Choice: Skilled Nursing Facility Living arrangements for the past 2 months: Single Family Home                   DME Agency: NA                   Social Determinants of Health (SDOH) Interventions SDOH Screenings   Food Insecurity: No Food Insecurity (06/19/2023)  Housing: Low Risk  (06/19/2023)  Transportation Needs: No Transportation Needs (06/19/2023)  Utilities: Not At Risk (06/19/2023)  Alcohol Screen: Low Risk  (04/18/2022)  Depression (PHQ2-9): Medium Risk (05/07/2023)  Financial Resource Strain: Low Risk  (04/01/2023)   Received from [facility]  Physical Activity: Inactive (04/18/2022)  Social Connections: Moderately Integrated (04/18/2022)  Stress: No Stress Concern Present (04/18/2022)  Tobacco Use: Low Risk  (06/18/2023)    Readmission Risk Interventions    06/20/2023    4:01 PM  Readmission Risk Prevention Plan  Transportation Screening Complete  HRI or Home Care Consult Complete  Social Work Consult for Recovery Care Planning/Counseling Complete  Palliative Care Screening Not Applicable  Medication Review Oceanographer) Referral to Pharmacy

## 2023-06-22 NOTE — Plan of Care (Signed)
Problem: Education: Goal: Knowledge of General Education information will improve Description: Including pain rating scale, medication(s)/side effects and non-pharmacologic comfort measures 06/22/2023 0959 by Herma Carson, RN Outcome: Adequate for Discharge 06/22/2023 0739 by Herma Carson, RN Outcome: Progressing   Problem: Health Behavior/Discharge Planning: Goal: Ability to manage health-related needs will improve 06/22/2023 0959 by Herma Carson, RN Outcome: Adequate for Discharge 06/22/2023 0739 by Herma Carson, RN Outcome: Progressing   Problem: Clinical Measurements: Goal: Ability to maintain clinical measurements within normal limits will improve 06/22/2023 0959 by Herma Carson, RN Outcome: Adequate for Discharge 06/22/2023 0739 by Herma Carson, RN Outcome: Progressing Goal: Will remain free from infection 06/22/2023 0959 by Herma Carson, RN Outcome: Adequate for Discharge 06/22/2023 0739 by Herma Carson, RN Outcome: Progressing Goal: Diagnostic test results will improve 06/22/2023 0959 by Herma Carson, RN Outcome: Adequate for Discharge 06/22/2023 0739 by Herma Carson, RN Outcome: Progressing Goal: Respiratory complications will improve 06/22/2023 0959 by Herma Carson, RN Outcome: Adequate for Discharge 06/22/2023 0739 by Herma Carson, RN Outcome: Progressing Goal: Cardiovascular complication will be avoided 06/22/2023 0959 by Herma Carson, RN Outcome: Adequate for Discharge 06/22/2023 0739 by Herma Carson, RN Outcome: Progressing   Problem: Activity: Goal: Risk for activity intolerance will decrease 06/22/2023 0959 by Herma Carson, RN Outcome: Adequate for Discharge 06/22/2023 0739 by Herma Carson, RN Outcome: Progressing   Problem: Nutrition: Goal: Adequate nutrition will be maintained 06/22/2023 0959 by Herma Carson, RN Outcome: Adequate for Discharge 06/22/2023 0739 by Herma Carson, RN Outcome: Progressing    Problem: Coping: Goal: Level of anxiety will decrease 06/22/2023 0959 by Herma Carson, RN Outcome: Adequate for Discharge 06/22/2023 0739 by Herma Carson, RN Outcome: Progressing   Problem: Elimination: Goal: Will not experience complications related to bowel motility 06/22/2023 0959 by Herma Carson, RN Outcome: Adequate for Discharge 06/22/2023 0739 by Herma Carson, RN Outcome: Progressing Goal: Will not experience complications related to urinary retention 06/22/2023 0959 by Herma Carson, RN Outcome: Adequate for Discharge 06/22/2023 0739 by Herma Carson, RN Outcome: Progressing   Problem: Pain Managment: Goal: General experience of comfort will improve 06/22/2023 0959 by Herma Carson, RN Outcome: Adequate for Discharge 06/22/2023 0739 by Herma Carson, RN Outcome: Progressing   Problem: Safety: Goal: Ability to remain free from injury will improve 06/22/2023 0959 by Herma Carson, RN Outcome: Adequate for Discharge 06/22/2023 0739 by Herma Carson, RN Outcome: Progressing   Problem: Skin Integrity: Goal: Risk for impaired skin integrity will decrease 06/22/2023 0959 by Herma Carson, RN Outcome: Adequate for Discharge 06/22/2023 0739 by Herma Carson, RN Outcome: Progressing   Problem: Education: Goal: Ability to describe self-care measures that may prevent or decrease complications (Diabetes Survival Skills Education) will improve 06/22/2023 0959 by Herma Carson, RN Outcome: Adequate for Discharge 06/22/2023 0739 by Herma Carson, RN Outcome: Progressing Goal: Individualized Educational Video(s) 06/22/2023 0959 by Herma Carson, RN Outcome: Adequate for Discharge 06/22/2023 0739 by Herma Carson, RN Outcome: Progressing   Problem: Coping: Goal: Ability to adjust to condition or change in health will improve 06/22/2023 0959 by Herma Carson, RN Outcome: Adequate for Discharge 06/22/2023 0739 by Herma Carson, RN Outcome:  Progressing   Problem: Fluid Volume: Goal: Ability to maintain a balanced intake and output will improve 06/22/2023 0959 by Herma Carson, RN Outcome: Adequate for Discharge 06/22/2023 0739 by Edrick Oh,  Ree Kida, RN Outcome: Progressing   Problem: Health Behavior/Discharge Planning: Goal: Ability to identify and utilize available resources and services will improve 06/22/2023 0959 by Herma Carson, RN Outcome: Adequate for Discharge 06/22/2023 0739 by Herma Carson, RN Outcome: Progressing Goal: Ability to manage health-related needs will improve 06/22/2023 0959 by Herma Carson, RN Outcome: Adequate for Discharge 06/22/2023 0739 by Herma Carson, RN Outcome: Progressing   Problem: Metabolic: Goal: Ability to maintain appropriate glucose levels will improve 06/22/2023 0959 by Herma Carson, RN Outcome: Adequate for Discharge 06/22/2023 0739 by Herma Carson, RN Outcome: Progressing   Problem: Nutritional: Goal: Maintenance of adequate nutrition will improve 06/22/2023 0959 by Herma Carson, RN Outcome: Adequate for Discharge 06/22/2023 0739 by Herma Carson, RN Outcome: Progressing Goal: Progress toward achieving an optimal weight will improve 06/22/2023 0959 by Herma Carson, RN Outcome: Adequate for Discharge 06/22/2023 0739 by Herma Carson, RN Outcome: Progressing   Problem: Skin Integrity: Goal: Risk for impaired skin integrity will decrease 06/22/2023 0959 by Herma Carson, RN Outcome: Adequate for Discharge 06/22/2023 0739 by Herma Carson, RN Outcome: Progressing   Problem: Tissue Perfusion: Goal: Adequacy of tissue perfusion will improve 06/22/2023 0959 by Herma Carson, RN Outcome: Adequate for Discharge 06/22/2023 0739 by Herma Carson, RN Outcome: Progressing   Problem: Education: Goal: Knowledge of General Education information will improve Description: Including pain rating scale, medication(s)/side effects and non-pharmacologic comfort  measures 06/22/2023 0959 by Herma Carson, RN Outcome: Adequate for Discharge 06/22/2023 0739 by Herma Carson, RN Outcome: Progressing   Problem: Health Behavior/Discharge Planning: Goal: Ability to manage health-related needs will improve 06/22/2023 0959 by Herma Carson, RN Outcome: Adequate for Discharge 06/22/2023 0739 by Herma Carson, RN Outcome: Progressing   Problem: Clinical Measurements: Goal: Ability to maintain clinical measurements within normal limits will improve 06/22/2023 0959 by Herma Carson, RN Outcome: Adequate for Discharge 06/22/2023 0739 by Herma Carson, RN Outcome: Progressing Goal: Will remain free from infection 06/22/2023 0959 by Herma Carson, RN Outcome: Adequate for Discharge 06/22/2023 0739 by Herma Carson, RN Outcome: Progressing Goal: Diagnostic test results will improve 06/22/2023 0959 by Herma Carson, RN Outcome: Adequate for Discharge 06/22/2023 0739 by Herma Carson, RN Outcome: Progressing Goal: Respiratory complications will improve 06/22/2023 0959 by Herma Carson, RN Outcome: Adequate for Discharge 06/22/2023 0739 by Herma Carson, RN Outcome: Progressing Goal: Cardiovascular complication will be avoided 06/22/2023 0959 by Herma Carson, RN Outcome: Adequate for Discharge 06/22/2023 0739 by Herma Carson, RN Outcome: Progressing   Problem: Activity: Goal: Risk for activity intolerance will decrease 06/22/2023 0959 by Herma Carson, RN Outcome: Adequate for Discharge 06/22/2023 0739 by Herma Carson, RN Outcome: Progressing   Problem: Nutrition: Goal: Adequate nutrition will be maintained 06/22/2023 0959 by Herma Carson, RN Outcome: Adequate for Discharge 06/22/2023 0739 by Herma Carson, RN Outcome: Progressing   Problem: Coping: Goal: Level of anxiety will decrease 06/22/2023 0959 by Herma Carson, RN Outcome: Adequate for Discharge 06/22/2023 0739 by Herma Carson, RN Outcome: Progressing    Problem: Elimination: Goal: Will not experience complications related to bowel motility 06/22/2023 0959 by Herma Carson, RN Outcome: Adequate for Discharge 06/22/2023 0739 by Herma Carson, RN Outcome: Progressing Goal: Will not experience complications related to urinary retention 06/22/2023 0959 by Herma Carson, RN Outcome: Adequate for Discharge 06/22/2023 0739 by Herma Carson, RN Outcome: Progressing  Problem: Pain Managment: Goal: General experience of comfort will improve 06/22/2023 0959 by Herma Carson, RN Outcome: Adequate for Discharge 06/22/2023 0739 by Herma Carson, RN Outcome: Progressing   Problem: Safety: Goal: Ability to remain free from injury will improve 06/22/2023 0959 by Herma Carson, RN Outcome: Adequate for Discharge 06/22/2023 0739 by Herma Carson, RN Outcome: Progressing   Problem: Skin Integrity: Goal: Risk for impaired skin integrity will decrease 06/22/2023 0959 by Herma Carson, RN Outcome: Adequate for Discharge 06/22/2023 0739 by Herma Carson, RN Outcome: Progressing   Problem: Acute Rehab OT Goals (only OT should resolve) Goal: Pt. Will Perform Grooming Outcome: Adequate for Discharge Goal: Pt. Will Perform Lower Body Bathing Outcome: Adequate for Discharge Goal: Pt. Will Perform Lower Body Dressing Outcome: Adequate for Discharge Goal: Pt. Will Transfer To Toilet Outcome: Adequate for Discharge   Problem: Acute Rehab PT Goals(only PT should resolve) Goal: Pt Will Go Supine/Side To Sit Outcome: Adequate for Discharge Goal: Patient Will Transfer Sit To/From Stand Outcome: Adequate for Discharge Goal: Pt Will Transfer Bed To Chair/Chair To Bed Outcome: Adequate for Discharge Goal: Pt Will Ambulate Outcome: Adequate for Discharge Goal: Pt/caregiver will Perform Home Exercise Program Outcome: Adequate for Discharge

## 2023-06-23 LAB — PROTEIN ELECTROPHORESIS, SERUM
A/G Ratio: 1.1 (ref 0.7–1.7)
Albumin ELP: 3.4 g/dL (ref 2.9–4.4)
Alpha-1-Globulin: 0.2 g/dL (ref 0.0–0.4)
Alpha-2-Globulin: 0.6 g/dL (ref 0.4–1.0)
Beta Globulin: 1 g/dL (ref 0.7–1.3)
Gamma Globulin: 1.2 g/dL (ref 0.4–1.8)
Globulin, Total: 3 g/dL (ref 2.2–3.9)
Total Protein ELP: 6.4 g/dL (ref 6.0–8.5)

## 2023-06-25 ENCOUNTER — Encounter (INDEPENDENT_AMBULATORY_CARE_PROVIDER_SITE_OTHER): Payer: Self-pay | Admitting: Gastroenterology

## 2023-06-25 ENCOUNTER — Other Ambulatory Visit (HOSPITAL_COMMUNITY)
Admission: RE | Admit: 2023-06-25 | Discharge: 2023-06-25 | Disposition: A | Discharge status: Home / Self Care | Payer: Medicare PPO | Source: Ambulatory Visit | Attending: Gastroenterology | Admitting: Gastroenterology

## 2023-06-25 ENCOUNTER — Ambulatory Visit (INDEPENDENT_AMBULATORY_CARE_PROVIDER_SITE_OTHER): Payer: Medicare PPO | Admitting: Gastroenterology

## 2023-06-25 ENCOUNTER — Encounter: Payer: Self-pay | Admitting: Cardiology

## 2023-06-25 ENCOUNTER — Ambulatory Visit: Payer: Medicare PPO | Attending: Cardiology | Admitting: Cardiology

## 2023-06-25 VITALS — BP 102/59 | HR 74 | Temp 98.0°F | Ht 68.0 in

## 2023-06-25 VITALS — BP 118/52 | HR 66 | Ht 67.0 in

## 2023-06-25 DIAGNOSIS — D649 Anemia, unspecified: Secondary | ICD-10-CM | POA: Diagnosis not present

## 2023-06-25 DIAGNOSIS — K921 Melena: Secondary | ICD-10-CM | POA: Diagnosis not present

## 2023-06-25 DIAGNOSIS — I10 Essential (primary) hypertension: Secondary | ICD-10-CM | POA: Diagnosis not present

## 2023-06-25 DIAGNOSIS — K6289 Other specified diseases of anus and rectum: Secondary | ICD-10-CM

## 2023-06-25 DIAGNOSIS — I472 Ventricular tachycardia, unspecified: Secondary | ICD-10-CM | POA: Diagnosis not present

## 2023-06-25 DIAGNOSIS — R600 Localized edema: Secondary | ICD-10-CM | POA: Diagnosis not present

## 2023-06-25 DIAGNOSIS — K649 Unspecified hemorrhoids: Secondary | ICD-10-CM | POA: Diagnosis not present

## 2023-06-25 DIAGNOSIS — D509 Iron deficiency anemia, unspecified: Secondary | ICD-10-CM | POA: Insufficient documentation

## 2023-06-25 LAB — IRON AND TIBC
Iron: 38 ug/dL (ref 28–170)
Saturation Ratios: 14 % (ref 10.4–31.8)
TIBC: 270 ug/dL (ref 250–450)
UIBC: 232 ug/dL

## 2023-06-25 LAB — FERRITIN: Ferritin: 180 ng/mL (ref 11–307)

## 2023-06-25 MED ORDER — PANTOPRAZOLE SODIUM 40 MG PO TBEC
40.0000 mg | DELAYED_RELEASE_TABLET | Freq: Every day | ORAL | 1 refills | Status: DC
Start: 2023-06-25 — End: 2023-07-31

## 2023-06-25 NOTE — Progress Notes (Signed)
Referring Provider: Kerri Perches, MD Primary Care Physician:  Kerri Perches, MD Primary GI Physician: Dr. Levon Hedger   Chief Complaint  Patient presents with   Follow-up    Patient here today due to a follow up on her bleeding hemorrhoids. Patient had a recent visit to the Ed where they had to use the paddles to revive her two times this past Sunday. Patient unable to stand today for a weight. She has had a decreased appetite and needs an evaluation for a feeding tube.    HPI:   ARNEISHA KINCANNON is a 82 y.o. female with past medical history of dementia, alpha thalassemia trait, anemia, DM, GERD, HLD, HTN, iron deficiency, OSA, PVD   Patient presenting today for follow up of hemorrhoids, Now with darker stools   Last seen February 2024, at that time feeling well. Has some abdominal pain on occasion, occasional pain in rectum and some fresh blood when straining.   Recommended to follow with PCP regarding anemia, sent [facility] hemorrhoid cream.  Recent hospital admission at the end of September with V tach, shocked x2, now on amiodarone, also found at that time to have a UTI-treated with rocephin and acute on chronic kidney failure.   Hgb on 9/28 was 8.5( baseline 8-9 range)  Present:  Patient arrives with her daughter who reports she is having darker stools recently. Patient denies having rectal pain. Daughter reports regarding recent admission, as outlined above,  that patient was taken to [facility] due to being unconscious, EMS was called. Daughter reports she was given blood x2 at [facility] for hgb of 8.1. She had to be shocked at [facility] and she was then transferred to [facility] after about a day and a half. Unclear if she was having rectal bleeding during admission, daughter reports stools were very dark but no bright red blood.  No notes in chart review mention dark stools and no hemoccult done. Daughter is unsure when darker stools began as she first noted them when she changed her  moms brief in the hospital. She is not on any iron pills. Was previously receiving Iron infusions via nephrology but daughter reports these were stopped as patient fell ill. She has been losing weight due to not eating. She is on a daily baby ASA. No ACs but she did receive DVT prophylaxis heparin during her admission. Daughter reports patient is having some intermittent lower abdominal pain which has been a chronic issue. Pain seems to come on without any precipitating factors, endorses this seems more like a spasm, she was on dicyclomine for this in the past without good results.   Daughter reports patient is not eating well at all. Facility has mentioned a feeding tube but patient does not want to undergo this.    Last Colonoscopy:07/20/15  Prep suboptimal. She had a lot of thick liquid stool scattered throughout the colon. Cecal landmarks well seen. 25 mm flat ulcerated lesion noted next to ileocecal valve. Multiple biopsies taken. Path only showed ulcerated mucosa. Mucosa of rest of the colon was normal. Normal rectal mucosa. Small hemorrhoids below the dentate line.   Since 2016, she has had at least 3 CT scan with IV contrast(last April 2023) that have been negative for malignancy in the colon.   Last Endoscopy:never   Past Medical History:  Diagnosis Date   Alpha thalassemia trait 01/26/2010   02/2012: Nl CBC ex H&H-10.7/34.8, MCV-69    Anemia    Anxiety    Anxiety and depression  Cellulitis 05/09/2017   Depression    Diabetes mellitus    Foot pain, right 04/30/2013   GERD (gastroesophageal reflux disease)    Headache(784.0)    Hyperlipidemia    Hypertension    Iron deficiency 01/21/2017   Microcytic anemia 01/26/2010   02/2012: Nl CBC ex H&H-10.7/34.8, MCV-69    NECK PAIN, CHRONIC 10/21/2008   +chronic back pain     Obesity    Obstructive sleep apnea    Osteoarthritis    Left knee; right shoulder; chronic neck and back pain   Pruritus    PVD (peripheral vascular  disease) (HCC) 01/28/2014   Seizures (HCC)    Shoulder pain, right 04/14/2015   Tremor    This started months ago after her seizure progressing to very poor hand writing   Uncontrolled type 2 diabetes mellitus with hypoglycemia, with long-term current use of insulin (HCC) 06/12/2022   Urinary incontinence    UTI (urinary tract infection) 01/18/2013    Past Surgical History:  Procedure Laterality Date   ABDOMINAL HYSTERECTOMY     AMPUTATION TOE Right 12/20/2022   Procedure: RIGHT TOE AMPUTATION TOE;  Surgeon: Louann Sjogren, DPM;  Location: [facility] OR;  Service: Podiatry;  Laterality: Right;   BREAST EXCISIONAL BIOPSY     Left; cyst   CATARACT EXTRACTION Right    12/2017   CATARACT EXTRACTION W/ INTRAOCULAR LENS IMPLANT Left 09/07/2013   CHOLECYSTECTOMY     COLONOSCOPY     COLONOSCOPY N/A 07/20/2015   Procedure: COLONOSCOPY;  Surgeon: Malissa Hippo, MD;  Location: [facility] ENDO SUITE;  Service: Endoscopy;  Laterality: N/A;  930   EYE SURGERY Left 09/07/2013   cataract    Current Outpatient Medications  Medication Sig Dispense Refill   acetaminophen (TYLENOL) 325 MG tablet Take 2 tablets (650 mg total) by mouth every 6 (six) hours as needed for mild pain, fever or headache.     amiodarone (PACERONE) 200 MG tablet Take 1 tablet (200 mg total) by mouth daily.     aspirin 81 MG tablet Take 81 mg by mouth daily.     atorvastatin (LIPITOR) 40 MG tablet TAKE 1 TABLET BY MOUTH EVERY DAY 30 tablet 2   buPROPion (WELLBUTRIN XL) 150 MG 24 hr tablet TAKE 1 TABLET BY MOUTH EVERY MORNING 90 tablet 2   cephALEXin (KEFLEX) 500 MG capsule Take 1 capsule (500 mg total) by mouth every 12 (twelve) hours.     Cholecalciferol 125 MCG (5000 UT) TABS Take 5,000 Units by mouth daily.     cyanocobalamin 1000 MCG tablet Take 1 tablet (1,000 mcg total) by mouth daily.     gabapentin (NEURONTIN) 300 MG capsule Take 1 capsule (300 mg total) by mouth at bedtime.     insulin aspart (NOVOLOG) 100 UNIT/ML injection Inject  0-5 Units into the skin at bedtime.     insulin aspart (NOVOLOG) 100 UNIT/ML injection Inject 0-6 Units into the skin 3 (three) times daily with meals.     Multiple Vitamin (MULTIVITAMIN) capsule Take 1 capsule by mouth daily.     No current facility-administered medications for this visit.    Allergies as of 06/25/2023 - Review Complete 06/25/2023  Allergen Reaction Noted   Penicillins Shortness Of Breath, Itching, and Rash 09/05/2007   Prednisone Shortness Of Breath, Itching, and Rash 09/05/2007   Propoxyphene n-acetaminophen Itching and Nausea And Vomiting 09/05/2007   Gentamicin Rash 12/19/2022   Sulfa antibiotics Itching and Nausea And Vomiting 09/05/2007    Family History  Problem Relation  Age of Onset   Lung cancer Mother 51   Kidney disease Father    Diabetes Sister    Keloids Brother    ADD / ADHD Grandchild    Bipolar disorder Grandchild    Bipolar disorder Daughter    Seizures Daughter    Heart disease Daughter    Kidney disease Son    Neuropathy Son    Kidney disease Son    Edema Daughter    Breast cancer Daughter 37   Allergies Daughter    Alcohol abuse Neg Hx    Drug abuse Neg Hx     Social History   Socioeconomic History   Marital status: Married    Spouse name: saunders   Number of children: 5   Years of education: 12   Highest education level: Not on file  Occupational History   Occupation: Disabled     Employer: RETIRED  Tobacco Use   Smoking status: Never    Passive exposure: Past   Smokeless tobacco: Never  Vaping Use   Vaping status: Never Used  Substance and Sexual Activity   Alcohol use: No    Alcohol/week: 0.0 standard drinks of alcohol   Drug use: No   Sexual activity: Yes    Birth control/protection: Surgical  Other Topics Concern   Not on file  Social History Narrative   07/27/20 Patient lives at home with her husband Shara Blazing) and dgtr- Jasmine December.  Patient is retired.    Right handed.    Five Children.    Caffeine- 2 daily    Social Determinants of Health   Financial Resource Strain: Low Risk  (04/01/2023)   [facility]   Overall Financial Resource Strain (CARDIA)    Difficulty of Paying Living Expenses: Not hard at all  Food Insecurity: No Food Insecurity (06/19/2023)   Hunger Vital Sign    Worried About Running Out of Food in the Last Year: Never true    Ran Out of Food in the Last Year: Never true  Transportation Needs: No Transportation Needs (06/19/2023)   PRAPARE - Administrator, Civil Service (Medical): No    Lack of Transportation (Non-Medical): No  Physical Activity: Inactive (04/18/2022)   Exercise Vital Sign    Days of Exercise per Week: 0 days    Minutes of Exercise per Session: 0 min  Stress: No Stress Concern Present (04/18/2022)   [employer] of Occupational Health - Occupational Stress Questionnaire    Feeling of Stress : Not at all  Social Connections: Moderately Integrated (04/18/2022)   Social Connection and Isolation Panel [NHANES]    Frequency of Communication with Friends and Family: More than three times a week    Frequency of Social Gatherings with Friends and Family: More than three times a week    Attends Religious Services: More than 4 times per year    Active Member of [employer] or Organizations: No    Attends Engineer, structural: Never    Marital Status: Married    Review of systems General: negative for malaise, night sweats, fever, chills  Neck: Negative for lumps, goiter, pain and significant neck swelling Resp: Negative for cough, wheezing, dyspnea at rest CV: Negative for chest pain, leg swelling, palpitations, orthopnea GI: denies hematochezia, nausea, vomiting, diarrhea, constipation, dysphagia, odyonophagia, early satiety or unintentional weight loss. +abdominal pain +?melena +weight loss  MSK: Negative for joint pain or swelling, back pain, and muscle pain. Derm: Negative for itching or rash Psych: Denies  depression,  anxiety, memory loss, confusion. No homicidal or suicidal ideation.  Heme: Negative for prolonged bleeding, bruising easily, and swollen nodes. Endocrine: Negative for cold or heat intolerance, polyuria, polydipsia and goiter. Neuro: negative for tremor, gait imbalance, syncope and seizures. The remainder of the review of systems is noncontributory.  Physical Exam: BP (!) 102/59 (BP Location: Left Arm, Patient Position: Sitting, Cuff Size: Large)   Pulse 74   Temp 98 F (36.7 C) (Temporal)   Ht 5\' 8"  (1.727 m)   BMI 30.41 kg/m  General:   Alert and oriented. No distress noted. Pleasant and cooperative.  Head:  Normocephalic and atraumatic. Eyes:  Conjuctiva clear without scleral icterus. Mouth:  Oral mucosa pink and moist. Good dentition. No lesions. Heart: Normal rate and rhythm, s1 and s2 heart sounds present.  Lungs: Clear lung sounds in all lobes. Respirations equal and unlabored. Abdomen:  +BS, soft, non-tender and non-distended. No rebound or guarding. No HSM or masses noted. Derm: No palmar erythema or jaundice Msk:  Symmetrical without gross deformities. Normal posture. Extremities:  Without edema. Neurologic:  Alert and  oriented x4 Psych:  Alert and cooperative. Normal mood and affect.  Invalid input(s): "6 MONTHS"   ASSESSMENT: CHITARA CLONCH is a 82 y.o. female presenting today for follow up of hemorrhoids and with darker stools  Patient denies rectal pain currently. Daughter concerned with recently noted darker stools while patient was admitted to the hospital. She has chronic anemia and hgb appears to be around baseline though she did receive 2 units PRBCs on 9/23 at [facility] when she presented for syncope, hgb prior to transfusion was 8.5. hgb has fluctuated between 8.4 and 9.8 over the past 3 months. Previously receiving iron infusions but was unable to continue going due to falling ill. daughter denies any BRBPR. Patient is not on ACs, but takes daily baby aspirin and  received DVT prophylaxis via heparin in the hospital.   Endoscopic evaluations for patients anemia have been discussed previously and were deferred due to patient's comorbidities. Given recent V tach, among history of multimorbidities, she is not an endoscopic candidate. Unclear if darker stools are secondary to UGIB or not at this time and hemoglobin has fluctuated at baseline, BUN has been elevated, but given her renal disorder cannot specifically correlate this with possible UGIB. patient is unable to provide little history due to her dementia. Will check iron studies, heme occult stool cards x3 and start Protonix 40mg  daily. Further recommendations to follow pending results of these test.    PLAN:  Start protonix 40mg  daily  2. check Iron studies  3. FOBT x3 4. Further recommendations to follow  All questions were answered, patient verbalized understanding and is in agreement with plan as outlined above.   Follow Up: 6-8 weeks  Able Malloy L. Jeanmarie Hubert, MSN, APRN, AGNP-C Adult-Gerontology Nurse Practitioner [facility]  I have reviewed the note and agree with the APP's assessment as described in this progress note  Katrinka Blazing, MD Gastroenterology and Hepatology [facility]astroenterology

## 2023-06-25 NOTE — Patient Instructions (Signed)
Medication Instructions:  Your physician recommends that you continue on your current medications as directed. Please refer to the Current Medication list given to you today.  *If you need a refill on your cardiac medications before your next appointment, please call your pharmacy*   Lab Work: None If you have labs (blood work) drawn today and your tests are completely normal, you will receive your results only by: MyChart Message (if you have MyChart) OR A paper copy in the mail If you have any lab test that is abnormal or we need to change your treatment, we will call you to review the results.   Testing/Procedures: None   Follow-Up: At [facility], you and your health needs are our priority.  As part of our continuing mission to provide you with exceptional heart care, we have created designated Provider Care Teams.  These Care Teams include your primary Cardiologist (physician) and Advanced Practice Providers (APPs -  Physician Assistants and Nurse Practitioners) who all work together to provide you with the care you need, when you need it.  We recommend signing up for the patient portal called "MyChart".  Sign up information is provided on this After Visit Summary.  MyChart is used to connect with patients for Virtual Visits (Telemedicine).  Patients are able to view lab/test results, encounter notes, upcoming appointments, etc.  Non-urgent messages can be sent to your provider as well.   To learn more about what you can do with MyChart, go to [email or web address].    Your next appointment:   4 month(s)  Provider:   You may see Dina Rich, MD or one of the following Advanced Practice Providers on your designated Care Team:   Randall An, PA-C  Jacolyn Reedy, [place]     Other Instructions

## 2023-06-25 NOTE — Progress Notes (Signed)
Clinical Summary Ms. Rodriges is a 82 y.o.female seen today for follow up of the following medical problems.    Ventricular tachycardia -admitted 05/2023 with VT, shocked by EMS and again in the ER. Tracings were not available for cardiology review, made for difficult inpatient evaluation.  - started on IV amiodarone, then transitioned to oral - K was 4.9 Mag 2, Cr up to 6 and BUN 168. Trop only up to 23 - echo 05/2023 LVEF 50-55%, no WMAs, grade I dd - due to advanced age, dementia, comorbidities, renal failure cath not pursued - no recurrence on amio, discharged with home monitor. Avoid QT prolonging agents.    2.AKI on CKD - Cr up to 6 during 05/2023 admission. Discharge Cr 1.5 - poor oral intake due to dementia prior to admission    3. Leg swelling  - off diuretics since hospital charge with AKI, hypovolemia - with progressing dementia significant decrease in her oral intake - off diuretics since discharge, no signs of fluid overload.  - continue to monitor.    4. HTN - no long requiring bp meds       3. HL - compliant with statin.  Last panel Jan 2018 TC 162 TG 90 HDL 68 LDL 76   05/2020 TC 161 TG 74 HDL 49 LDL 44 03/2021 TC 14 TG 111 HDL 58 LDL 66 Jan 2023 TC 096 TG 045 HDL 60 LDL 70 - she is on atorvastatin 40mg  daily    5. Alpha thalasemia - followed by hematology   6. CKD III -followed by Dr Wolfgang Phoenix      Past Medical History:  Diagnosis Date   Alpha thalassemia trait 01/26/2010   02/2012: Nl CBC ex H&H-10.7/34.8, MCV-69    Anemia    Anxiety    Anxiety and depression    Cellulitis 05/09/2017   Depression    Diabetes mellitus    Foot pain, right 04/30/2013   GERD (gastroesophageal reflux disease)    Headache(784.0)    Hyperlipidemia    Hypertension    Iron deficiency 01/21/2017   Microcytic anemia 01/26/2010   02/2012: Nl CBC ex H&H-10.7/34.8, MCV-69    NECK PAIN, CHRONIC 10/21/2008   +chronic back pain     Obesity    Obstructive sleep apnea     Osteoarthritis    Left knee; right shoulder; chronic neck and back pain   Pruritus    PVD (peripheral vascular disease) (HCC) 01/28/2014   Seizures (HCC)    Shoulder pain, right 04/14/2015   Tremor    This started months ago after her seizure progressing to very poor hand writing   Uncontrolled type 2 diabetes mellitus with hypoglycemia, with long-term current use of insulin (HCC) 06/12/2022   Urinary incontinence    UTI (urinary tract infection) 01/18/2013     Allergies  Allergen Reactions   Penicillins Shortness Of Breath, Itching and Rash   Prednisone Shortness Of Breath, Itching and Rash   Propoxyphene N-Acetaminophen Itching and Nausea And Vomiting    Darvocet    Gentamicin Rash    Cream   Sulfa Antibiotics Itching and Nausea And Vomiting     Current Outpatient Medications  Medication Sig Dispense Refill   acetaminophen (TYLENOL) 325 MG tablet Take 2 tablets (650 mg total) by mouth every 6 (six) hours as needed for mild pain, fever or headache.     amiodarone (PACERONE) 200 MG tablet Take 1 tablet (200 mg total) by mouth daily.     aspirin  81 MG tablet Take 81 mg by mouth daily.     atorvastatin (LIPITOR) 40 MG tablet TAKE 1 TABLET BY MOUTH EVERY DAY 30 tablet 2   buPROPion (WELLBUTRIN XL) 150 MG 24 hr tablet TAKE 1 TABLET BY MOUTH EVERY MORNING 90 tablet 2   cephALEXin (KEFLEX) 500 MG capsule Take 1 capsule (500 mg total) by mouth every 12 (twelve) hours.     Cholecalciferol 125 MCG (5000 UT) TABS Take 5,000 Units by mouth daily.     cyanocobalamin 1000 MCG tablet Take 1 tablet (1,000 mcg total) by mouth daily.     gabapentin (NEURONTIN) 300 MG capsule Take 1 capsule (300 mg total) by mouth at bedtime.     hydrOXYzine (ATARAX) 25 MG tablet Take 25 mg by mouth every 6 (six) hours as needed.     insulin lispro (HUMALOG) 100 UNIT/ML injection Inject 1 Units into the skin 3 (three) times daily before meals. Per SS     mirtazapine (REMERON SOL-TAB) 15 MG disintegrating  tablet Take 15 mg by mouth at bedtime.     Multiple Vitamin (MULTIVITAMIN) capsule Take 1 capsule by mouth daily.     OVER THE COUNTER MEDICATION Probiotic one bid     pantoprazole (PROTONIX) 40 MG tablet Take 1 tablet (40 mg total) by mouth daily. 90 tablet 1   No current facility-administered medications for this visit.     Past Surgical History:  Procedure Laterality Date   ABDOMINAL HYSTERECTOMY     AMPUTATION TOE Right 12/20/2022   Procedure: RIGHT TOE AMPUTATION TOE;  Surgeon: Louann Sjogren, DPM;  Location: [facility] OR;  Service: Podiatry;  Laterality: Right;   BREAST EXCISIONAL BIOPSY     Left; cyst   CATARACT EXTRACTION Right    12/2017   CATARACT EXTRACTION W/ INTRAOCULAR LENS IMPLANT Left 09/07/2013   CHOLECYSTECTOMY     COLONOSCOPY     COLONOSCOPY N/A 07/20/2015   Procedure: COLONOSCOPY;  Surgeon: Malissa Hippo, MD;  Location: [facility];  Service: Endoscopy;  Laterality: N/A;  930   EYE SURGERY Left 09/07/2013   cataract     Allergies  Allergen Reactions   Penicillins Shortness Of Breath, Itching and Rash   Prednisone Shortness Of Breath, Itching and Rash   Propoxyphene N-Acetaminophen Itching and Nausea And Vomiting    Darvocet    Gentamicin Rash    Cream   Sulfa Antibiotics Itching and Nausea And Vomiting      Family History  Problem Relation Age of Onset   Lung cancer Mother 21   Kidney disease Father    Diabetes Sister    Keloids Brother    ADD / ADHD Grandchild    Bipolar disorder Grandchild    Bipolar disorder Daughter    Seizures Daughter    Heart disease Daughter    Kidney disease Son    Neuropathy Son    Kidney disease Son    Edema Daughter    Breast cancer Daughter 15   Allergies Daughter    Alcohol abuse Neg Hx    Drug abuse Neg Hx      Social History Ms. Wingler reports that she has never smoked. She has been exposed to tobacco smoke. She has never used smokeless tobacco. Ms. Jeanlouis reports no history of alcohol use.   Review of  Systems CONSTITUTIONAL: No weight loss, fever, chills, weakness or fatigue.  HEENT: Eyes: No visual loss, blurred vision, double vision or yellow sclerae.No hearing loss, sneezing, congestion, runny nose or sore throat.  SKIN: No rash or itching.  CARDIOVASCULAR: per hpi RESPIRATORY: No shortness of breath, cough or sputum.  GASTROINTESTINAL: No anorexia, nausea, vomiting or diarrhea. No abdominal pain or blood.  GENITOURINARY: No burning on urination, no polyuria NEUROLOGICAL: No headache, dizziness, syncope, paralysis, ataxia, numbness or tingling in the extremities. No change in bowel or bladder control.  MUSCULOSKELETAL: No muscle, back pain, joint pain or stiffness.  LYMPHATICS: No enlarged nodes. No history of splenectomy.  PSYCHIATRIC: No history of depression or anxiety.  ENDOCRINOLOGIC: No reports of sweating, cold or heat intolerance. No polyuria or polydipsia.  Marland Kitchen   Physical Examination Today's Vitals   06/25/23 1518  BP: (!) 118/52  Pulse: 66  Height: 5\' 7"  (1.702 m)   Body mass index is 31.32 kg/m.  Gen: resting comfortably, no acute distress HEENT: no scleral icterus, pupils equal round and reactive, no palptable cervical adenopathy,  CV: RRR, no m/rg, no jvd Resp: Clear to auscultation bilaterally GI: abdomen is soft, non-tender, non-distended, normal bowel sounds, no hepatosplenomegaly MSK: extremities are warm, no edema.  Skin: warm, no rash Neuro:  no focal deficits Psych: appropriate affect   Diagnostic Studies 01/2013 echo Study Conclusions  - Left ventricle: The cavity size was normal. There was mild   concentric hypertrophy. Systolic function was normal. The   estimated ejection fraction was in the range of 60% to   65%. Wall motion was normal; there were no regional wall   motion abnormalities. - Aortic valve: Mildly calcified annulus. - Right ventricle: The cavity size was normal. Wall   thickness was mildly increased. - Atrial septum: No defect  or patent foramen ovale was   identified.   01/2013 MPI IMPRESSION: Probably negative pharmacologic stress nuclear myocardial study revealing normal left ventricular size, normal left ventricular systolic function and no significant stress-induced EKG abnormalities.  On scintigraphic imaging, there was a small and mild defect that likely represents variable breast attenuation; however, the possibility of modest anterior ischemia cannot be unequivocally excluded.   01/2013 Event monitor No significant arrhythmias   12/2014 ABIs: R 1.10 L 1.15   02/2015 Carotid US 1-39% bilateral stenosis    Assessment and Plan   1. Ventricular tachycardia - difficult assessment during recent admission, strips were not available for cardiology interpretation. Shocked by EMS and then again in ER for WCT - started on amiodarone during admission - due to dementia cath not pursued. Echo without significant findings - had outpatient monitor on but due to dementia and agitation pulled it off. Will not retry as I don't believe she will keep it on, we will just continue amiodarone at this time and likely indefinitely. Rapidly progressing dementia with decreased oral intake, I think unfortunately she may decline health wise significantly over the next few months. - EKG today shows NSR  2. LE edema - poor oral intake with progressing edmentia - off diuretics due to AKI during recent admission, no signs of edema today - continue to monitor   3. Dementia - marked progression since I last saw her in 07/2022, currently living in a SNF. Family reports rapid decline over months - agree avoiding any aggressive medical interventions given severe rapidly progressing dementia        Antoine Poche, M.D.

## 2023-06-25 NOTE — Patient Instructions (Signed)
We will check stools for blood I am checking iron studies to see how her iron levels look  I am starting protonix 40mg  daily which will act as a protective agent in the stomach if she is bleeding As discussed, at this time, she is not a candidate to undergo EGD or colonoscopy. Please make me aware if she has any bright red blood in her stool. I will be in touch with further recommendations once I have labs back  Follow up 6-8 weeks

## 2023-06-27 DIAGNOSIS — N1832 Chronic kidney disease, stage 3b: Secondary | ICD-10-CM | POA: Diagnosis not present

## 2023-06-29 DIAGNOSIS — I10 Essential (primary) hypertension: Secondary | ICD-10-CM | POA: Diagnosis not present

## 2023-06-29 DIAGNOSIS — E1169 Type 2 diabetes mellitus with other specified complication: Secondary | ICD-10-CM | POA: Diagnosis not present

## 2023-06-29 DIAGNOSIS — F0394 Unspecified dementia, unspecified severity, with anxiety: Secondary | ICD-10-CM | POA: Diagnosis not present

## 2023-06-29 DIAGNOSIS — F32A Depression, unspecified: Secondary | ICD-10-CM | POA: Diagnosis not present

## 2023-06-29 DIAGNOSIS — E1149 Type 2 diabetes mellitus with other diabetic neurological complication: Secondary | ICD-10-CM | POA: Diagnosis not present

## 2023-06-29 DIAGNOSIS — E559 Vitamin D deficiency, unspecified: Secondary | ICD-10-CM | POA: Diagnosis not present

## 2023-06-29 DIAGNOSIS — E785 Hyperlipidemia, unspecified: Secondary | ICD-10-CM | POA: Diagnosis not present

## 2023-06-29 DIAGNOSIS — F03B3 Unspecified dementia, moderate, with mood disturbance: Secondary | ICD-10-CM | POA: Diagnosis not present

## 2023-06-29 DIAGNOSIS — M5440 Lumbago with sciatica, unspecified side: Secondary | ICD-10-CM | POA: Diagnosis not present

## 2023-07-01 ENCOUNTER — Other Ambulatory Visit (HOSPITAL_COMMUNITY): Payer: Self-pay | Admitting: Family Medicine

## 2023-07-01 DIAGNOSIS — I472 Ventricular tachycardia, unspecified: Secondary | ICD-10-CM | POA: Diagnosis not present

## 2023-07-01 DIAGNOSIS — R5381 Other malaise: Secondary | ICD-10-CM | POA: Diagnosis not present

## 2023-07-02 ENCOUNTER — Encounter: Payer: Self-pay | Admitting: Family Medicine

## 2023-07-02 ENCOUNTER — Ambulatory Visit (INDEPENDENT_AMBULATORY_CARE_PROVIDER_SITE_OTHER): Payer: Medicare PPO | Admitting: Family Medicine

## 2023-07-02 VITALS — BP 106/67 | HR 77 | Resp 16 | Ht 67.0 in

## 2023-07-02 DIAGNOSIS — D649 Anemia, unspecified: Secondary | ICD-10-CM | POA: Diagnosis not present

## 2023-07-02 DIAGNOSIS — I10 Essential (primary) hypertension: Secondary | ICD-10-CM | POA: Diagnosis not present

## 2023-07-02 DIAGNOSIS — N179 Acute kidney failure, unspecified: Secondary | ICD-10-CM

## 2023-07-02 DIAGNOSIS — Z09 Encounter for follow-up examination after completed treatment for conditions other than malignant neoplasm: Secondary | ICD-10-CM | POA: Diagnosis not present

## 2023-07-02 DIAGNOSIS — I472 Ventricular tachycardia, unspecified: Secondary | ICD-10-CM | POA: Diagnosis not present

## 2023-07-02 DIAGNOSIS — N390 Urinary tract infection, site not specified: Secondary | ICD-10-CM | POA: Diagnosis not present

## 2023-07-02 DIAGNOSIS — N1832 Chronic kidney disease, stage 3b: Secondary | ICD-10-CM

## 2023-07-02 DIAGNOSIS — E1149 Type 2 diabetes mellitus with other diabetic neurological complication: Secondary | ICD-10-CM

## 2023-07-02 DIAGNOSIS — Z23 Encounter for immunization: Secondary | ICD-10-CM

## 2023-07-02 DIAGNOSIS — M810 Age-related osteoporosis without current pathological fracture: Secondary | ICD-10-CM | POA: Diagnosis not present

## 2023-07-02 DIAGNOSIS — E785 Hyperlipidemia, unspecified: Secondary | ICD-10-CM | POA: Diagnosis not present

## 2023-07-02 DIAGNOSIS — K219 Gastro-esophageal reflux disease without esophagitis: Secondary | ICD-10-CM | POA: Diagnosis not present

## 2023-07-02 DIAGNOSIS — G309 Alzheimer's disease, unspecified: Secondary | ICD-10-CM | POA: Diagnosis not present

## 2023-07-02 DIAGNOSIS — R5381 Other malaise: Secondary | ICD-10-CM | POA: Diagnosis not present

## 2023-07-02 DIAGNOSIS — R269 Unspecified abnormalities of gait and mobility: Secondary | ICD-10-CM

## 2023-07-02 MED ORDER — DIPHENHYDRAMINE HCL 25 MG PO TABS: ORAL_TABLET | ORAL | 3 refills | Status: AC

## 2023-07-02 NOTE — Patient Instructions (Addendum)
F/U in 8 to 10 weeks, call if you need me sooner  We will complete fL2 and call when ready  Stop hydroxyzine, new is benadryl 25 mg one at nigh  Handicap sticker today  You are referred to [facility] for assistance with placement as able, also work with facility staff

## 2023-07-08 ENCOUNTER — Telehealth: Payer: Self-pay | Admitting: Family Medicine

## 2023-07-08 DIAGNOSIS — F0280 Dementia in other diseases classified elsewhere without behavioral disturbance: Secondary | ICD-10-CM | POA: Diagnosis not present

## 2023-07-08 DIAGNOSIS — E876 Hypokalemia: Secondary | ICD-10-CM | POA: Diagnosis not present

## 2023-07-08 DIAGNOSIS — R5381 Other malaise: Secondary | ICD-10-CM | POA: Diagnosis not present

## 2023-07-08 DIAGNOSIS — I10 Essential (primary) hypertension: Secondary | ICD-10-CM | POA: Diagnosis not present

## 2023-07-08 DIAGNOSIS — R531 Weakness: Secondary | ICD-10-CM | POA: Diagnosis not present

## 2023-07-08 DIAGNOSIS — N189 Chronic kidney disease, unspecified: Secondary | ICD-10-CM | POA: Diagnosis not present

## 2023-07-08 NOTE — Telephone Encounter (Signed)
Laura Atkins called patient called patient being discharged today from [facility] this afternoon.

## 2023-07-10 DIAGNOSIS — F0394 Unspecified dementia, unspecified severity, with anxiety: Secondary | ICD-10-CM | POA: Diagnosis not present

## 2023-07-10 DIAGNOSIS — I10 Essential (primary) hypertension: Secondary | ICD-10-CM | POA: Diagnosis not present

## 2023-07-10 DIAGNOSIS — E785 Hyperlipidemia, unspecified: Secondary | ICD-10-CM | POA: Diagnosis not present

## 2023-07-10 DIAGNOSIS — E559 Vitamin D deficiency, unspecified: Secondary | ICD-10-CM | POA: Diagnosis not present

## 2023-07-10 DIAGNOSIS — E1169 Type 2 diabetes mellitus with other specified complication: Secondary | ICD-10-CM | POA: Diagnosis not present

## 2023-07-10 DIAGNOSIS — M5440 Lumbago with sciatica, unspecified side: Secondary | ICD-10-CM | POA: Diagnosis not present

## 2023-07-10 DIAGNOSIS — E1149 Type 2 diabetes mellitus with other diabetic neurological complication: Secondary | ICD-10-CM | POA: Diagnosis not present

## 2023-07-10 DIAGNOSIS — F32A Depression, unspecified: Secondary | ICD-10-CM | POA: Diagnosis not present

## 2023-07-10 DIAGNOSIS — F03B3 Unspecified dementia, moderate, with mood disturbance: Secondary | ICD-10-CM | POA: Diagnosis not present

## 2023-07-11 ENCOUNTER — Telehealth: Payer: Self-pay | Admitting: Family Medicine

## 2023-07-11 ENCOUNTER — Ambulatory Visit: Payer: Medicare PPO | Admitting: Nurse Practitioner

## 2023-07-11 DIAGNOSIS — E782 Mixed hyperlipidemia: Secondary | ICD-10-CM

## 2023-07-11 DIAGNOSIS — N1832 Chronic kidney disease, stage 3b: Secondary | ICD-10-CM

## 2023-07-11 DIAGNOSIS — Z794 Long term (current) use of insulin: Secondary | ICD-10-CM

## 2023-07-11 DIAGNOSIS — I10 Essential (primary) hypertension: Secondary | ICD-10-CM

## 2023-07-11 NOTE — Telephone Encounter (Signed)
Laura Atkins called from [facility] out of [facility] about care on patient yesterday was order potassium level, she is not drinking her water.  Patient daughter  is asking bring her her into office for lab draw. Asked for nurse to contact the patient daughter Laura Atkins [phone] regards the lab order.  Plan of care nursing will be going back in 2 weeks that time will needs to be recert and physical therapy going out next wee for eval. Patient was confused had to keep explaining over and over.     [name] asking for a call back on  plan of care today [phone].

## 2023-07-16 DIAGNOSIS — F03B3 Unspecified dementia, moderate, with mood disturbance: Secondary | ICD-10-CM | POA: Diagnosis not present

## 2023-07-16 DIAGNOSIS — E559 Vitamin D deficiency, unspecified: Secondary | ICD-10-CM | POA: Diagnosis not present

## 2023-07-16 DIAGNOSIS — E785 Hyperlipidemia, unspecified: Secondary | ICD-10-CM | POA: Diagnosis not present

## 2023-07-16 DIAGNOSIS — F0394 Unspecified dementia, unspecified severity, with anxiety: Secondary | ICD-10-CM | POA: Diagnosis not present

## 2023-07-16 DIAGNOSIS — I10 Essential (primary) hypertension: Secondary | ICD-10-CM | POA: Diagnosis not present

## 2023-07-16 DIAGNOSIS — F32A Depression, unspecified: Secondary | ICD-10-CM | POA: Diagnosis not present

## 2023-07-16 DIAGNOSIS — E1169 Type 2 diabetes mellitus with other specified complication: Secondary | ICD-10-CM | POA: Diagnosis not present

## 2023-07-16 DIAGNOSIS — M5440 Lumbago with sciatica, unspecified side: Secondary | ICD-10-CM | POA: Diagnosis not present

## 2023-07-16 DIAGNOSIS — E1149 Type 2 diabetes mellitus with other diabetic neurological complication: Secondary | ICD-10-CM | POA: Diagnosis not present

## 2023-07-19 DIAGNOSIS — E559 Vitamin D deficiency, unspecified: Secondary | ICD-10-CM | POA: Diagnosis not present

## 2023-07-19 DIAGNOSIS — M5440 Lumbago with sciatica, unspecified side: Secondary | ICD-10-CM | POA: Diagnosis not present

## 2023-07-19 DIAGNOSIS — F32A Depression, unspecified: Secondary | ICD-10-CM | POA: Diagnosis not present

## 2023-07-19 DIAGNOSIS — E1149 Type 2 diabetes mellitus with other diabetic neurological complication: Secondary | ICD-10-CM | POA: Diagnosis not present

## 2023-07-19 DIAGNOSIS — I10 Essential (primary) hypertension: Secondary | ICD-10-CM | POA: Diagnosis not present

## 2023-07-19 DIAGNOSIS — E1169 Type 2 diabetes mellitus with other specified complication: Secondary | ICD-10-CM | POA: Diagnosis not present

## 2023-07-19 DIAGNOSIS — F0394 Unspecified dementia, unspecified severity, with anxiety: Secondary | ICD-10-CM | POA: Diagnosis not present

## 2023-07-19 DIAGNOSIS — E785 Hyperlipidemia, unspecified: Secondary | ICD-10-CM | POA: Diagnosis not present

## 2023-07-19 DIAGNOSIS — F03B3 Unspecified dementia, moderate, with mood disturbance: Secondary | ICD-10-CM | POA: Diagnosis not present

## 2023-07-22 ENCOUNTER — Encounter: Payer: Self-pay | Admitting: Family Medicine

## 2023-07-22 DIAGNOSIS — F32A Depression, unspecified: Secondary | ICD-10-CM | POA: Diagnosis not present

## 2023-07-22 DIAGNOSIS — E785 Hyperlipidemia, unspecified: Secondary | ICD-10-CM | POA: Diagnosis not present

## 2023-07-22 DIAGNOSIS — F0394 Unspecified dementia, unspecified severity, with anxiety: Secondary | ICD-10-CM | POA: Diagnosis not present

## 2023-07-22 DIAGNOSIS — F03B3 Unspecified dementia, moderate, with mood disturbance: Secondary | ICD-10-CM | POA: Diagnosis not present

## 2023-07-22 DIAGNOSIS — Z09 Encounter for follow-up examination after completed treatment for conditions other than malignant neoplasm: Secondary | ICD-10-CM | POA: Insufficient documentation

## 2023-07-22 DIAGNOSIS — E1169 Type 2 diabetes mellitus with other specified complication: Secondary | ICD-10-CM | POA: Diagnosis not present

## 2023-07-22 DIAGNOSIS — M5440 Lumbago with sciatica, unspecified side: Secondary | ICD-10-CM | POA: Diagnosis not present

## 2023-07-22 DIAGNOSIS — E1149 Type 2 diabetes mellitus with other diabetic neurological complication: Secondary | ICD-10-CM | POA: Diagnosis not present

## 2023-07-22 DIAGNOSIS — E559 Vitamin D deficiency, unspecified: Secondary | ICD-10-CM | POA: Diagnosis not present

## 2023-07-22 DIAGNOSIS — Z23 Encounter for immunization: Secondary | ICD-10-CM | POA: Insufficient documentation

## 2023-07-22 DIAGNOSIS — I10 Essential (primary) hypertension: Secondary | ICD-10-CM | POA: Diagnosis not present

## 2023-07-22 NOTE — Progress Notes (Signed)
Laura Atkins     MRN: [id number]      DOB: [birth year]  Chief Complaint  Patient presents with   Hospitalization Follow-up    Discharged on 9/28 to [facility] where she is currently. Family concerned she's not eating much. Also needs a handicapped placard and FL2 that is not marked 90 days because the insurance wouldn't approve it     HPI Laura Atkins is here for follow up of hospitalization from9/24 to 06/22/2023, after and episode of LOC, HR 28 and SBP in the 50/s, wen into V tach and and conversion back to NSR after defibrillation by EMS Has been d/c to SNF and family repeats her need to stay in a facility due to severe dementia with inability to control her behavior , her increased  risk of falls as a result , still trying to find affordable placement ROS Denies recent fever or chills. Denies sinus pressure, nasal congestion, ear pain or sore throat. Denies chest congestion, productive cough or wheezing. Denies chest pains, palpitations and leg swelling Denies abdominal pain, nausea, vomiting,diarrhea or constipation.   Chronic joint pain and instability, needs walker for ambulation which also needs to be supervised. Chronic  depression, and anxiety   PE  BP 106/67   Pulse 77   Resp 16   Ht 5\' 7"  (1.702 m)   SpO2 97%   BMI 31.32 kg/m   Patient sluggish and in no cardiopulmonary distress.  HEENT: No facial asymmetry, EOMI,     Neck decreased ROMChest: Clear to auscultation bilaterally.  CVS: S1, S2 no murmurs, no S3.Regular rate.  ABD: Soft non tender.   Ext: No edema  MS: Markedly decreased  ROM spine, shoulders, hips and knees.  Skin: Intact, no ulcerations or rash noted.  Psych: Fairly Good eye contact, flat affect. Memory impaired ,  CNS: CN 2-12 intact, power,  normal throughout.no focal deficits noted.   Assessment & Plan  Ventricular tachycardia (HCC) Sinus rhythm after 2 shocks , on in the field , and another in The ED, cardiology involved  in her care  UNSTEADY GAIT High fallrisk and difficulty controlling behavior  in home setting, best to be in SNF attempts at permanenetplacement ongoing  Type 2 diabetes mellitus with neurological complications (HCC) Laura Atkins is reminded of the importance of commitment to daily physical activity for 30 minutes or more, as able and the need to limit carbohydrate intake to 30 to 60 grams per meal to help with blood sugar control.   The need to take medication as prescribed, test blood sugar as directed, and to call between visits if there is a concern that blood sugar is uncontrolled is also discussed.   Laura Atkins is reminded of the importance of daily foot exam, annual eye examination, and good blood sugar, blood pressure and cholesterol control. Controlled on current management, managed by Endo     Latest Ref Rng & Units 06/22/2023    4:31 AM 06/21/2023    3:26 PM 06/20/2023    4:43 AM 06/19/2023    4:09 AM 06/18/2023    8:31 PM  Diabetic Labs  HbA1c 4.8 - 5.6 %     6.5   Creatinine 0.44 - 1.00 mg/dL 5.78  4.69  6.29  5.28  3.17       07/02/2023    3:55 PM 06/25/2023    3:18 PM 06/25/2023    9:40 AM 06/22/2023    3:05 AM 06/21/2023    9:28 PM 06/21/2023  11:12 AM 06/21/2023    4:42 AM  BP/Weight  Systolic BP 106 118 102 130 125 147 120  Diastolic BP 67 52 59 63 95 60 71  Wt. (Lbs)  -- --    200  BMI       30.41 kg/m2      Latest Ref Rng & Units 12/01/2021    9:40 AM 02/15/2021   12:00 AM  Foot/eye exam completion dates  Eye Exam No Retinopathy  Retinopathy      Foot Form Completion  Done      This result is from an external source.        UTI (urinary tract infection) Treatment completed , asymptomatic  Acute renal failure superimposed on stage 3b chronic kidney disease (HCC) Resolved following  recent hospitalization, importance of adequate hydration is stressed   Hospital discharge follow-up Patient in for follow up of recent hospitalization. Discharge summary, and  laboratory and radiology data are reviewed, and any questions or concerns  are discussed. Specific issues requiring follow up are specifically addressed.

## 2023-07-22 NOTE — Assessment & Plan Note (Signed)
Resolved following  recent hospitalization, importance of adequate hydration is stressed

## 2023-07-22 NOTE — Assessment & Plan Note (Signed)
Laura Atkins is reminded of the importance of commitment to daily physical activity for 30 minutes or more, as able and the need to limit carbohydrate intake to 30 to 60 grams per meal to help with blood sugar control.   The need to take medication as prescribed, test blood sugar as directed, and to call between visits if there is a concern that blood sugar is uncontrolled is also discussed.   Laura Atkins is reminded of the importance of daily foot exam, annual eye examination, and good blood sugar, blood pressure and cholesterol control. Controlled on current management, managed by Endo     Latest Ref Rng & Units 06/22/2023    4:31 AM 06/21/2023    3:26 PM 06/20/2023    4:43 AM 06/19/2023    4:09 AM 06/18/2023    8:31 PM  Diabetic Labs  HbA1c 4.8 - 5.6 %     6.5   Creatinine 0.44 - 1.00 mg/dL 1.61  0.96  0.45  4.09  3.17       07/02/2023    3:55 PM 06/25/2023    3:18 PM 06/25/2023    9:40 AM 06/22/2023    3:05 AM 06/21/2023    9:28 PM 06/21/2023   11:12 AM 06/21/2023    4:42 AM  BP/Weight  Systolic BP 106 118 102 130 125 147 120  Diastolic BP 67 52 59 63 95 60 71  Wt. (Lbs)  -- --    200  BMI       30.41 kg/m2      Latest Ref Rng & Units 12/01/2021    9:40 AM 02/15/2021   12:00 AM  Foot/eye exam completion dates  Eye Exam No Retinopathy  Retinopathy      Foot Form Completion  Done      This result is from an external source.

## 2023-07-22 NOTE — Assessment & Plan Note (Signed)
High fallrisk and difficulty controlling behavior  in home setting, best to be in SNF attempts at permanenetplacement ongoing

## 2023-07-22 NOTE — Assessment & Plan Note (Signed)
Patient in for follow up of recent hospitalization. Discharge summary, and laboratory and radiology data are reviewed, and any questions or concerns  are discussed. Specific issues requiring follow up are specifically addressed.  

## 2023-07-22 NOTE — Assessment & Plan Note (Signed)
Treatment completed , asymptomatic

## 2023-07-22 NOTE — Assessment & Plan Note (Signed)
Sinus rhythm after 2 shocks , on in the field , and another in The ED, cardiology involved in her care

## 2023-07-26 DIAGNOSIS — E1169 Type 2 diabetes mellitus with other specified complication: Secondary | ICD-10-CM | POA: Diagnosis not present

## 2023-07-26 DIAGNOSIS — E785 Hyperlipidemia, unspecified: Secondary | ICD-10-CM | POA: Diagnosis not present

## 2023-07-26 DIAGNOSIS — M5440 Lumbago with sciatica, unspecified side: Secondary | ICD-10-CM | POA: Diagnosis not present

## 2023-07-26 DIAGNOSIS — I10 Essential (primary) hypertension: Secondary | ICD-10-CM | POA: Diagnosis not present

## 2023-07-26 DIAGNOSIS — F0394 Unspecified dementia, unspecified severity, with anxiety: Secondary | ICD-10-CM | POA: Diagnosis not present

## 2023-07-26 DIAGNOSIS — F32A Depression, unspecified: Secondary | ICD-10-CM | POA: Diagnosis not present

## 2023-07-26 DIAGNOSIS — E559 Vitamin D deficiency, unspecified: Secondary | ICD-10-CM | POA: Diagnosis not present

## 2023-07-26 DIAGNOSIS — E1149 Type 2 diabetes mellitus with other diabetic neurological complication: Secondary | ICD-10-CM | POA: Diagnosis not present

## 2023-07-26 DIAGNOSIS — F03B3 Unspecified dementia, moderate, with mood disturbance: Secondary | ICD-10-CM | POA: Diagnosis not present

## 2023-07-29 ENCOUNTER — Telehealth: Payer: Self-pay | Admitting: Family Medicine

## 2023-07-29 DIAGNOSIS — M5440 Lumbago with sciatica, unspecified side: Secondary | ICD-10-CM | POA: Diagnosis not present

## 2023-07-29 DIAGNOSIS — E785 Hyperlipidemia, unspecified: Secondary | ICD-10-CM | POA: Diagnosis not present

## 2023-07-29 DIAGNOSIS — E1149 Type 2 diabetes mellitus with other diabetic neurological complication: Secondary | ICD-10-CM | POA: Diagnosis not present

## 2023-07-29 DIAGNOSIS — F32A Depression, unspecified: Secondary | ICD-10-CM | POA: Diagnosis not present

## 2023-07-29 DIAGNOSIS — E559 Vitamin D deficiency, unspecified: Secondary | ICD-10-CM | POA: Diagnosis not present

## 2023-07-29 DIAGNOSIS — E1169 Type 2 diabetes mellitus with other specified complication: Secondary | ICD-10-CM | POA: Diagnosis not present

## 2023-07-29 DIAGNOSIS — F03B3 Unspecified dementia, moderate, with mood disturbance: Secondary | ICD-10-CM | POA: Diagnosis not present

## 2023-07-29 DIAGNOSIS — F0394 Unspecified dementia, unspecified severity, with anxiety: Secondary | ICD-10-CM | POA: Diagnosis not present

## 2023-07-29 DIAGNOSIS — I10 Essential (primary) hypertension: Secondary | ICD-10-CM | POA: Diagnosis not present

## 2023-07-29 NOTE — Telephone Encounter (Signed)
Blanchie Serve Physcial therapist with [facility]   Verbal orders   PT 1 week 4    Call back [phone]

## 2023-07-29 NOTE — Telephone Encounter (Signed)
Verbal order given  

## 2023-07-30 ENCOUNTER — Other Ambulatory Visit: Payer: Self-pay | Admitting: Family Medicine

## 2023-07-30 ENCOUNTER — Ambulatory Visit: Payer: Medicare PPO | Admitting: Nurse Practitioner

## 2023-07-30 DIAGNOSIS — E782 Mixed hyperlipidemia: Secondary | ICD-10-CM

## 2023-07-30 DIAGNOSIS — Z794 Long term (current) use of insulin: Secondary | ICD-10-CM

## 2023-07-30 DIAGNOSIS — I10 Essential (primary) hypertension: Secondary | ICD-10-CM

## 2023-07-31 ENCOUNTER — Telehealth: Payer: Self-pay | Admitting: Nurse Practitioner

## 2023-07-31 ENCOUNTER — Telehealth: Payer: Self-pay | Admitting: Family Medicine

## 2023-07-31 NOTE — Telephone Encounter (Signed)
Patient no showed recent appt.  We were able to get patient rescheduled.

## 2023-07-31 NOTE — Telephone Encounter (Signed)
Zara called from [facility] [phone] rx received humalog what max daily dose was

## 2023-08-01 ENCOUNTER — Encounter: Payer: Self-pay | Admitting: Nurse Practitioner

## 2023-08-01 ENCOUNTER — Ambulatory Visit (INDEPENDENT_AMBULATORY_CARE_PROVIDER_SITE_OTHER): Payer: Medicare PPO | Admitting: Nurse Practitioner

## 2023-08-01 VITALS — BP 136/78 | HR 69 | Ht 67.0 in | Wt 212.2 lb

## 2023-08-01 DIAGNOSIS — E1122 Type 2 diabetes mellitus with diabetic chronic kidney disease: Secondary | ICD-10-CM | POA: Diagnosis not present

## 2023-08-01 DIAGNOSIS — E782 Mixed hyperlipidemia: Secondary | ICD-10-CM | POA: Diagnosis not present

## 2023-08-01 DIAGNOSIS — F0394 Unspecified dementia, unspecified severity, with anxiety: Secondary | ICD-10-CM | POA: Diagnosis not present

## 2023-08-01 DIAGNOSIS — Z794 Long term (current) use of insulin: Secondary | ICD-10-CM | POA: Diagnosis not present

## 2023-08-01 DIAGNOSIS — E785 Hyperlipidemia, unspecified: Secondary | ICD-10-CM | POA: Diagnosis not present

## 2023-08-01 DIAGNOSIS — F32A Depression, unspecified: Secondary | ICD-10-CM | POA: Diagnosis not present

## 2023-08-01 DIAGNOSIS — I10 Essential (primary) hypertension: Secondary | ICD-10-CM

## 2023-08-01 DIAGNOSIS — N1832 Chronic kidney disease, stage 3b: Secondary | ICD-10-CM

## 2023-08-01 DIAGNOSIS — E1149 Type 2 diabetes mellitus with other diabetic neurological complication: Secondary | ICD-10-CM | POA: Diagnosis not present

## 2023-08-01 DIAGNOSIS — M5440 Lumbago with sciatica, unspecified side: Secondary | ICD-10-CM | POA: Diagnosis not present

## 2023-08-01 DIAGNOSIS — F03B3 Unspecified dementia, moderate, with mood disturbance: Secondary | ICD-10-CM | POA: Diagnosis not present

## 2023-08-01 DIAGNOSIS — E559 Vitamin D deficiency, unspecified: Secondary | ICD-10-CM | POA: Diagnosis not present

## 2023-08-01 DIAGNOSIS — E1169 Type 2 diabetes mellitus with other specified complication: Secondary | ICD-10-CM | POA: Diagnosis not present

## 2023-08-01 NOTE — Progress Notes (Signed)
Endocrinology Follow Up Note       08/01/2023, 1:26 PM   Subjective:    Patient ID: Laura Atkins, female    DOB: 1941-01-06.  Laura Atkins is being seen in follow up after being seen in consultation for management of currently uncontrolled symptomatic diabetes requested by  Kerri Perches, MD.   Past Medical History:  Diagnosis Date   Alpha thalassemia trait 01/26/2010   02/2012: Nl CBC ex H&H-10.7/34.8, MCV-69    Anemia    Anxiety    Anxiety and depression    Cellulitis 05/09/2017   Depression    Diabetes mellitus    Foot pain, right 04/30/2013   GERD (gastroesophageal reflux disease)    Headache(784.0)    Hyperlipidemia    Hypertension    Iron deficiency 01/21/2017   Microcytic anemia 01/26/2010   02/2012: Nl CBC ex H&H-10.7/34.8, MCV-69    NECK PAIN, CHRONIC 10/21/2008   +chronic back pain     Obesity    Obstructive sleep apnea    Osteoarthritis    Left knee; right shoulder; chronic neck and back pain   Pruritus    PVD (peripheral vascular disease) (HCC) 01/28/2014   Seizures (HCC)    Shoulder pain, right 04/14/2015   Tremor    This started months ago after her seizure progressing to very poor hand writing   Uncontrolled type 2 diabetes mellitus with hypoglycemia, with long-term current use of insulin (HCC) 06/12/2022   Urinary incontinence    UTI (urinary tract infection) 01/18/2013    Past Surgical History:  Procedure Laterality Date   ABDOMINAL HYSTERECTOMY     AMPUTATION TOE Right 12/20/2022   Procedure: RIGHT TOE AMPUTATION TOE;  Surgeon: Louann Sjogren, DPM;  Location: [facility] OR;  Service: Podiatry;  Laterality: Right;   BREAST EXCISIONAL BIOPSY     Left; cyst   CATARACT EXTRACTION Right    12/2017   CATARACT EXTRACTION W/ INTRAOCULAR LENS IMPLANT Left 09/07/2013   CHOLECYSTECTOMY     COLONOSCOPY     COLONOSCOPY N/A 07/20/2015   Procedure: COLONOSCOPY;  Surgeon: Malissa Hippo, MD;  Location: [facility];  Service: Endoscopy;  Laterality: N/A;  930   EYE SURGERY Left 09/07/2013   cataract    Social History   Socioeconomic History   Marital status: Married    Spouse name: saunders   Number of children: 5   Years of education: 12   Highest education level: Not on file  Occupational History   Occupation: Disabled     Employer: RETIRED  Tobacco Use   Smoking status: Never    Passive exposure: Past   Smokeless tobacco: Never  Vaping Use   Vaping status: Never Used  Substance and Sexual Activity   Alcohol use: No    Alcohol/week: 0.0 standard drinks of alcohol   Drug use: No   Sexual activity: Yes    Birth control/protection: Surgical  Other Topics Concern   Not on file  Social History Narrative   07/27/20 Patient lives at home with her husband Shara Blazing) and dgtr- Jasmine December.  Patient is retired.    Right handed.    Five Children.    Caffeine- 2 daily  Social Determinants of Health   Financial Resource Strain: Low Risk  (04/01/2023)   Received from [facility]   Overall Financial Resource Strain (CARDIA)    Difficulty of Paying Living Expenses: Not hard at all  Food Insecurity: No Food Insecurity (06/19/2023)   Hunger Vital Sign    Worried About Running Out of Food in the Last Year: Never true    Ran Out of Food in the Last Year: Never true  Transportation Needs: No Transportation Needs (06/19/2023)   PRAPARE - Administrator, Civil Service (Medical): No    Lack of Transportation (Non-Medical): No  Physical Activity: Inactive (04/18/2022)   Exercise Vital Sign    Days of Exercise per Week: 0 days    Minutes of Exercise per Session: 0 min  Stress: No Stress Concern Present (04/18/2022)   [employer] of Occupational Health - Occupational Stress Questionnaire    Feeling of Stress : Not at all  Social Connections: Moderately Integrated (04/18/2022)   Social Connection and Isolation Panel [NHANES]    Frequency of  Communication with Friends and Family: More than three times a week    Frequency of Social Gatherings with Friends and Family: More than three times a week    Attends Religious Services: More than 4 times per year    Active Member of [employer] or Organizations: No    Attends Banker Meetings: Never    Marital Status: Married    Family History  Problem Relation Age of Onset   Lung cancer Mother 77   Kidney disease Father    Diabetes Sister    Keloids Brother    ADD / ADHD Grandchild    Bipolar disorder Grandchild    Bipolar disorder Daughter    Seizures Daughter    Heart disease Daughter    Kidney disease Son    Neuropathy Son    Kidney disease Son    Edema Daughter    Breast cancer Daughter 42   Allergies Daughter    Alcohol abuse Neg Hx    Drug abuse Neg Hx     Outpatient Encounter Medications as of 08/01/2023  Medication Sig   acetaminophen (TYLENOL) 325 MG tablet Take 2 tablets (650 mg total) by mouth every 6 (six) hours as needed for mild pain, fever or headache.   amiodarone (PACERONE) 200 MG tablet TAKE 1 TABLET BY MOUTH DAILY   aspirin 81 MG tablet Take 81 mg by mouth daily.   atorvastatin (LIPITOR) 40 MG tablet TAKE 1 TABLET BY MOUTH EVERY DAY   Cholecalciferol 125 MCG (5000 UT) TABS Take 5,000 Units by mouth daily.   citalopram (CELEXA) 10 MG tablet TAKE 1 TABLET BY MOUTH DAILY   Cyanocobalamin (B-12) 1000 MCG TABS TAKE 1 TABLET BY MOUTH EVERY DAY   diphenhydrAMINE (BENADRYL ALLERGY) 25 MG tablet Take one tablet at bedtime   gabapentin (NEURONTIN) 300 MG capsule TAKE ONE CAPSULE BY MOUTH NIGHTLY   mirtazapine (REMERON SOL-TAB) 15 MG disintegrating tablet Take 15 mg by mouth at bedtime.   OVER THE COUNTER MEDICATION Probiotic one bid   pantoprazole (PROTONIX) 40 MG tablet TAKE 1 TABLET BY MOUTH DAILY   potassium chloride SA (KLOR-CON M20) 20 MEQ tablet Take 40 mEq by mouth 2 (two) times daily.   [DISCONTINUED] HUMALOG KWIKPEN 100 UNIT/ML KwikPen INJECT 2  UNITS UNDER THE SKIN THREE TIMES DAILY BEFORE MEALS and AT BEDTIME AS NEEDED (GIVE ssi if needed. if fsbs 201-250: 2 UNITS; 251-300: 4 UNITS; 301-350:  6 UNITS; 351-400: 8 UNITS; 401-450: 10 UNITS; less THAN 50 OR greater THAN 450: call doctor   [DISCONTINUED] cephALEXin (KEFLEX) 500 MG capsule Take 1 capsule (500 mg total) by mouth every 12 (twelve) hours. (Patient not taking: Reported on 08/01/2023)   [DISCONTINUED] insulin lispro (HUMALOG) 100 UNIT/ML injection Inject 1 Units into the skin 3 (three) times daily before meals. Per SS (Patient not taking: Reported on 08/01/2023)   No facility-administered encounter medications on file as of 08/01/2023.    ALLERGIES: Allergies  Allergen Reactions   Penicillins Shortness Of Breath, Itching and Rash   Prednisone Shortness Of Breath, Itching and Rash   Propoxyphene N-Acetaminophen Itching and Nausea And Vomiting    Darvocet    Gentamicin Rash    Cream   Sulfa Antibiotics Itching and Nausea And Vomiting    VACCINATION STATUS: Immunization History  Administered Date(s) Administered   Fluad Quad(high Dose 65+) 05/20/2019, 06/22/2022   Fluad Trivalent(High Dose 65+) 07/02/2023   H1N1 07/15/2008   Influenza Split 06/17/2012   Influenza Whole 07/03/2007, 06/17/2009, 06/07/2011   Influenza, High Dose Seasonal PF 07/17/2018   Influenza,inj,Quad PF,6+ Mos 07/13/2013, 09/06/2014, 08/10/2015, 05/23/2016, 05/22/2017   Pneumococcal Conjugate-13 05/03/2014   Pneumococcal Polysaccharide-23 09/08/2010   Td 09/08/2010   Tdap 07/17/2018   Unspecified SARS-COV-2 Vaccination 09/25/2019, 10/26/2019    Diabetes She presents for her follow-up diabetic visit. She has type 2 diabetes mellitus. Onset time: poor historian- was diagnosed in her early 26s. Her disease course has been improving. There are no hypoglycemic associated symptoms. Pertinent negatives for diabetes include no blurred vision, no fatigue, no polydipsia and no polyuria. There are no  hypoglycemic complications. Symptoms are stable. Diabetic complications include a CVA, nephropathy, peripheral neuropathy and PVD. [facility] daughter reports CVA in past) Risk factors for coronary artery disease include diabetes mellitus, dyslipidemia, family history, hypertension, obesity, post-menopausal and sedentary lifestyle. Current diabetic treatment includes insulin injections. She is compliant with treatment most of the time. Her weight is fluctuating minimally. She is following a generally healthy diet. When asked about meal planning, she reported none. She has not had a previous visit with a dietitian. She rarely participates in exercise. Her home blood glucose trend is decreasing steadily. Her breakfast blood glucose range is generally 70-90 mg/dl. (She presents today, accompanied by her grand-daughter, with her meter showing tight fasting glycemic profile.  Her most recent A1c today from 9/24 was 6.5%, improving from last visit of 6.9%.  Analysis of her meter shows 7-day average of 89, 14-day average of 94, 30-day average of 104.  She has been in/out of the hospital, recently got home from a rehab stay about a month ago.  Her medication regimen was adjusted at that time and the only medication being given for diabetes at this time is Humalog 2 units before bed.) An ACE inhibitor/angiotensin II receptor blocker is being taken. She sees a podiatrist.Eye exam is current.  Hypertension This is a chronic problem. The current episode started more than 1 year ago. The problem has been resolved since onset. The problem is controlled. Pertinent negatives include no blurred vision. There are no associated agents to hypertension. Risk factors for coronary artery disease include diabetes mellitus, dyslipidemia, family history, obesity, post-menopausal state and sedentary lifestyle. Past treatments include calcium channel blockers and ACE inhibitors. The current treatment provides moderate improvement. Compliance  problems include exercise and diet.  Hypertensive end-organ damage includes kidney disease, CVA and PVD. Identifiable causes of hypertension include chronic renal disease and sleep apnea.  Hyperlipidemia This is a chronic problem. The current episode started more than 1 year ago. The problem is controlled. Recent lipid tests were reviewed and are normal. Exacerbating diseases include chronic renal disease, diabetes and obesity. There are no known factors aggravating her hyperlipidemia. Current antihyperlipidemic treatment includes statins. The current treatment provides moderate improvement of lipids. Compliance problems include adherence to diet and adherence to exercise.  Risk factors for coronary artery disease include diabetes mellitus, dyslipidemia, family history, hypertension, obesity, a sedentary lifestyle and post-menopausal.     Review of systems  Constitutional: + increasing body weight, Body mass index is 33.24 kg/m., + fatigue, no subjective hyperthermia, no subjective hypothermia Eyes: no blurry vision, no xerophthalmia ENT: no sore throat, no nodules palpated in throat, no dysphagia/odynophagia, no hoarseness Cardiovascular: no chest pain, no shortness of breath, no palpitations, + intermittent leg swelling Respiratory: no cough, no shortness of breath Gastrointestinal: no nausea/vomiting/diarrhea Genitourinary: + polyuria and incontinence Musculoskeletal: Hx chronic pain; walks with walker Skin: no rashes, no hyperemia Neurological: no tremors, no numbness, no tingling, no dizziness Psychiatric: no depression, no anxiety  Objective:     BP 136/78 (BP Location: Right Arm, Patient Position: Sitting, Cuff Size: Large)   Pulse 69   Ht 5\' 7"  (1.702 m)   Wt 212 lb 3.2 oz (96.3 kg)   BMI 33.24 kg/m   Wt Readings from Last 3 Encounters:  08/01/23 212 lb 3.2 oz (96.3 kg)  06/21/23 200 lb (90.7 kg)  05/07/23 227 lb (103 kg)     BP Readings from Last 3 Encounters:  08/01/23  136/78  07/02/23 106/67  06/25/23 (!) 118/52     Physical Exam- Limited  Constitutional:  Body mass index is 39.08 kg/m. , not in acute distress, normal state of mind Eyes:  EOMI, no exophthalmos Musculoskeletal: no gross deformities, strength intact in all four extremities, no gross restriction of joint movements, walks with walker Skin:  no rashes, no hyperemia Neurological: no tremor with outstretched hands    CMP ( most recent) CMP     Component Value Date/Time   NA 146 (H) 06/22/2023 0431   NA 146 (H) 10/24/2021 1218   K 3.4 (L) 06/22/2023 0431   CL 114 (H) 06/22/2023 0431   CO2 25 06/22/2023 0431   GLUCOSE 101 (H) 06/22/2023 0431   BUN 34 (H) 06/22/2023 0431   BUN 42 (H) 10/24/2021 1218   CREATININE 1.51 (H) 06/22/2023 0431   CREATININE 2.02 (H) 03/22/2022 1534   CALCIUM 9.1 06/22/2023 0431   PROT 7.3 12/18/2022 1244   PROT 7.0 10/24/2021 1218   ALBUMIN 3.6 02/07/2023 1431   ALBUMIN 4.3 10/24/2021 1218   AST 45 (H) 12/18/2022 1244   ALT 51 (H) 12/18/2022 1244   ALT 16 03/22/2022 1534   ALKPHOS 87 12/18/2022 1244   BILITOT 0.7 12/18/2022 1244   BILITOT 0.2 10/24/2021 1218   GFRNONAA 35 (L) 06/22/2023 0431   GFRNONAA 40 (L) 12/01/2019 1137   GFRAA 55 (L) 06/03/2020 0210   GFRAA 46 (L) 12/01/2019 1137     Diabetic Labs (most recent): Lab Results  Component Value Date   HGBA1C 6.5 (H) 06/18/2023   HGBA1C 6.9 09/13/2022   HGBA1C 6.9 05/10/2022   MICROALBUR 33.9 11/26/2018   MICROALBUR 20.9 (H) 10/15/2016   MICROALBUR 0.8 03/14/2015     Lipid Panel ( most recent) Lipid Panel     Component Value Date/Time   CHOL 153 10/24/2021 1218   TRIG 136 10/24/2021 1218   HDL  60 10/24/2021 1218   CHOLHDL 2.6 10/24/2021 1218   CHOLHDL 2.6 12/01/2019 1137   VLDL 16 05/14/2017 1039   LDLCALC 70 10/24/2021 1218   LDLCALC 72 12/01/2019 1137   LABVLDL 23 10/24/2021 1218      Lab Results  Component Value Date   TSH 2.026 05/07/2023   TSH 2.750 10/24/2021    TSH 1.129 05/27/2020   TSH 2.24 12/01/2019   TSH 1.12 11/26/2018   TSH 2.01 05/14/2017   TSH 2.13 02/21/2016   TSH 0.989 08/18/2014   TSH 0.520 01/23/2012   TSH 1.315 09/08/2010           Assessment & Plan:   1) Controlled Type 2 Diabetes with CKD stage 3b-  She presents today, accompanied by her grand-daughter, with her meter showing tight fasting glycemic profile.  Her most recent A1c today from 9/24 was 6.5%, improving from last visit of 6.9%.  Analysis of her meter shows 7-day average of 89, 14-day average of 94, 30-day average of 104.  She has been in/out of the hospital, recently got home from a rehab stay about a month ago.  Her medication regimen was adjusted at that time and the only medication being given for diabetes at this time is Humalog 2 units before bed.  - Laura Atkins has currently uncontrolled symptomatic type 2 DM since 82 years of age.   -Recent labs reviewed.  She has seen Dr. Wolfgang Phoenix in the past for CKD.  - I had a long discussion with her about the progressive nature of diabetes and the pathology behind its complications.  -her diabetes is complicated by CKD, CVA, neuropathy, PVD and she remains at a high risk for more acute and chronic complications which include CAD, CVA, CKD, retinopathy, and neuropathy. These are all discussed in detail with her.  - Nutritional counseling repeated at each appointment due to patients tendency to fall back in to old habits.  - The patient admits there is a room for improvement in their diet and drink choices. -  Suggestion is made for the patient to avoid simple carbohydrates from their diet including Cakes, Sweet Desserts / Pastries, Ice Cream, Soda (diet and regular), Sweet Tea, Candies, Chips, Cookies, Sweet Pastries, Store Bought Juices, Alcohol in Excess of 1-2 drinks a day, Artificial Sweeteners, Coffee Creamer, and "Sugar-free" Products. This will help patient to have stable blood glucose profile and potentially  avoid unintended weight gain.   - I encouraged the patient to switch to unprocessed or minimally processed complex starch and increased protein intake (animal or plant source), fruits, and vegetables.   - Patient is advised to stick to a routine mealtimes to eat 3 meals a day and avoid unnecessary snacks (to snack only to correct hypoglycemia).  - I have approached her with the following individualized plan to manage her diabetes and patient agrees:   -Given her age and comorbidities, avoiding hypoglycemia will be the number 1 priority in her diabetes management.  An A1c of 7-7.5 will be acceptable for her.  Adjustments need to be made slowly in her case to avoid inadvertent hypoglycemia.  -For safety purposes, she should not be on short-acting insulin at bedtime.  I stopped that today.  I also did not re-initiate [place] for her given tight glucose readings.  She will be given an opportunity to manage her diabetes without insulin treatment.  I instructed the patient and family to reach out if fasting readings are over 140 for 3 days in a  row.  At which time, we may re-initiate Tresiba at low dose.  -she is encouraged to continue monitoring blood glucose once daily, before breakfast, and to call the clinic if she has readings less than 80 or above 140 for 3 tests in a row.    - she is warned not to take insulin without proper monitoring per orders. - Adjustment parameters are given to her for hypo and hyperglycemia in writing.  - she is not a candidate for Metformin or SGLT2i due to concurrent renal insufficiency.  - she will be considered for incretin therapy as appropriate next visit.  - Specific targets for  A1c; LDL, HDL, and Triglycerides were discussed with the patient.  2) Blood Pressure /Hypertension:  her blood pressure is controlled to target.   she is advised to continue her current medications as prescribed by her PCP.  3) Lipids/Hyperlipidemia:    Review of her recent lipid  panel from 10/24/21 showed controlled LDL at 70 .  she is advised to continue Lipitor 40 mg daily at bedtime.  Side effects and precautions discussed with her.  4)  Weight/Diet:  her Body mass index is 33.24 kg/m.  -  clearly complicating her diabetes care.   she is a candidate for weight loss. I discussed with her the fact that loss of 5 - 10% of her  current body weight will have the most impact on her diabetes management.  Exercise, and detailed carbohydrates information provided  -  detailed on discharge instructions.  5) Chronic Care/Health Maintenance: -she is on ACEI/ARB and Statin medications and is encouraged to initiate and continue to follow up with Ophthalmology, Dentist, Podiatrist at least yearly or according to recommendations, and advised to stay away from smoking. I have recommended yearly flu vaccine and pneumonia vaccine at least every 5 years; moderate intensity exercise for up to 150 minutes weekly; and sleep for at least 7 hours a day.  - she is advised to maintain close follow up with Kerri Perches, MD for primary care needs, as well as her other providers for optimal and coordinated care.      I spent  30  minutes in the care of the patient today including review of labs from CMP, Lipids, Thyroid Function, Hematology (current and previous including abstractions from other facilities); face-to-face time discussing  her blood glucose readings/logs, discussing hypoglycemia and hyperglycemia episodes and symptoms, medications doses, her options of short and long term treatment based on the latest standards of care / guidelines;  discussion about incorporating lifestyle medicine;  and documenting the encounter. Risk reduction counseling performed per USPSTF guidelines to reduce obesity and cardiovascular risk factors.     Please refer to Patient Instructions for Blood Glucose Monitoring and Insulin/Medications Dosing Guide"  in media tab for additional information. Please   also refer to " Patient Self Inventory" in the Media  tab for reviewed elements of pertinent patient history.  Enid Skeens participated in the discussions, expressed understanding, and voiced agreement with the above plans.  All questions were answered to her satisfaction. she is encouraged to contact clinic should she have any questions or concerns prior to her return visit.     Follow up plan: - Return in about 3 months (around 11/01/2023) for Diabetes F/U with A1c in office, No previsit labs.   Ronny Bacon, [facility] [address] [place], [place] [address] Phone: [phone] Fax: [phone]  08/01/2023, 1:26 PM

## 2023-08-02 NOTE — Telephone Encounter (Signed)
Pharmacy aware

## 2023-08-09 DIAGNOSIS — M5440 Lumbago with sciatica, unspecified side: Secondary | ICD-10-CM | POA: Diagnosis not present

## 2023-08-09 DIAGNOSIS — F32A Depression, unspecified: Secondary | ICD-10-CM | POA: Diagnosis not present

## 2023-08-09 DIAGNOSIS — E559 Vitamin D deficiency, unspecified: Secondary | ICD-10-CM | POA: Diagnosis not present

## 2023-08-09 DIAGNOSIS — E1169 Type 2 diabetes mellitus with other specified complication: Secondary | ICD-10-CM | POA: Diagnosis not present

## 2023-08-09 DIAGNOSIS — F03B3 Unspecified dementia, moderate, with mood disturbance: Secondary | ICD-10-CM | POA: Diagnosis not present

## 2023-08-09 DIAGNOSIS — F0394 Unspecified dementia, unspecified severity, with anxiety: Secondary | ICD-10-CM | POA: Diagnosis not present

## 2023-08-09 DIAGNOSIS — I10 Essential (primary) hypertension: Secondary | ICD-10-CM | POA: Diagnosis not present

## 2023-08-09 DIAGNOSIS — E1149 Type 2 diabetes mellitus with other diabetic neurological complication: Secondary | ICD-10-CM | POA: Diagnosis not present

## 2023-08-09 DIAGNOSIS — E785 Hyperlipidemia, unspecified: Secondary | ICD-10-CM | POA: Diagnosis not present

## 2023-08-19 NOTE — Addendum Note (Signed)
Encounter addended by: Rachel Bo, CCT on: 08/19/2023 10:42 AM  Actions taken: Imaging Exam begun

## 2023-08-20 ENCOUNTER — Other Ambulatory Visit: Payer: Self-pay | Admitting: Family Medicine

## 2023-08-27 DIAGNOSIS — E1169 Type 2 diabetes mellitus with other specified complication: Secondary | ICD-10-CM | POA: Diagnosis not present

## 2023-08-27 DIAGNOSIS — F03B3 Unspecified dementia, moderate, with mood disturbance: Secondary | ICD-10-CM | POA: Diagnosis not present

## 2023-08-27 DIAGNOSIS — F32A Depression, unspecified: Secondary | ICD-10-CM | POA: Diagnosis not present

## 2023-08-27 DIAGNOSIS — M5440 Lumbago with sciatica, unspecified side: Secondary | ICD-10-CM | POA: Diagnosis not present

## 2023-08-27 DIAGNOSIS — I10 Essential (primary) hypertension: Secondary | ICD-10-CM | POA: Diagnosis not present

## 2023-08-27 DIAGNOSIS — E559 Vitamin D deficiency, unspecified: Secondary | ICD-10-CM | POA: Diagnosis not present

## 2023-08-27 DIAGNOSIS — F0394 Unspecified dementia, unspecified severity, with anxiety: Secondary | ICD-10-CM | POA: Diagnosis not present

## 2023-08-27 DIAGNOSIS — E1149 Type 2 diabetes mellitus with other diabetic neurological complication: Secondary | ICD-10-CM | POA: Diagnosis not present

## 2023-08-27 DIAGNOSIS — E785 Hyperlipidemia, unspecified: Secondary | ICD-10-CM | POA: Diagnosis not present

## 2023-08-29 ENCOUNTER — Ambulatory Visit (INDEPENDENT_AMBULATORY_CARE_PROVIDER_SITE_OTHER): Payer: Medicare PPO | Admitting: Gastroenterology

## 2023-09-04 ENCOUNTER — Ambulatory Visit: Payer: Medicare PPO | Admitting: Family Medicine

## 2023-09-18 ENCOUNTER — Other Ambulatory Visit: Payer: Self-pay | Admitting: Family Medicine

## 2023-09-19 ENCOUNTER — Ambulatory Visit: Payer: Self-pay | Admitting: Family Medicine

## 2023-09-20 ENCOUNTER — Encounter (HOSPITAL_COMMUNITY): Payer: Self-pay | Admitting: *Deleted

## 2023-09-20 ENCOUNTER — Other Ambulatory Visit: Payer: Self-pay

## 2023-09-20 ENCOUNTER — Emergency Department (HOSPITAL_COMMUNITY)
Admission: EM | Admit: 2023-09-20 | Discharge: 2023-09-20 | Disposition: A | Discharge status: Home / Self Care | Payer: Medicare PPO | Attending: Emergency Medicine | Admitting: Emergency Medicine

## 2023-09-20 DIAGNOSIS — E119 Type 2 diabetes mellitus without complications: Secondary | ICD-10-CM | POA: Insufficient documentation

## 2023-09-20 DIAGNOSIS — Z7982 Long term (current) use of aspirin: Secondary | ICD-10-CM | POA: Insufficient documentation

## 2023-09-20 DIAGNOSIS — N3090 Cystitis, unspecified without hematuria: Secondary | ICD-10-CM | POA: Insufficient documentation

## 2023-09-20 DIAGNOSIS — R1030 Lower abdominal pain, unspecified: Secondary | ICD-10-CM | POA: Diagnosis present

## 2023-09-20 LAB — CBC WITH DIFFERENTIAL/PLATELET
Abs Immature Granulocytes: 0.01 10*3/uL (ref 0.00–0.07)
Basophils Absolute: 0 10*3/uL (ref 0.0–0.1)
Basophils Relative: 0 %
Eosinophils Absolute: 0.4 10*3/uL (ref 0.0–0.5)
Eosinophils Relative: 7 %
HCT: 29.1 % — ABNORMAL LOW (ref 36.0–46.0)
Hemoglobin: 8.3 g/dL — ABNORMAL LOW (ref 12.0–15.0)
Immature Granulocytes: 0 %
Lymphocytes Relative: 32 %
Lymphs Abs: 1.9 10*3/uL (ref 0.7–4.0)
MCH: 21.7 pg — ABNORMAL LOW (ref 26.0–34.0)
MCHC: 28.5 g/dL — ABNORMAL LOW (ref 30.0–36.0)
MCV: 76.2 fL — ABNORMAL LOW (ref 80.0–100.0)
Monocytes Absolute: 0.4 10*3/uL (ref 0.1–1.0)
Monocytes Relative: 6 %
Neutro Abs: 3.3 10*3/uL (ref 1.7–7.7)
Neutrophils Relative %: 55 %
Platelets: 267 10*3/uL (ref 150–400)
RBC: 3.82 MIL/uL — ABNORMAL LOW (ref 3.87–5.11)
RDW: 13.9 % (ref 11.5–15.5)
WBC: 6 10*3/uL (ref 4.0–10.5)
nRBC: 0 % (ref 0.0–0.2)

## 2023-09-20 LAB — HEPATIC FUNCTION PANEL
ALT: 16 U/L (ref 0–44)
AST: 21 U/L (ref 15–41)
Albumin: 3.6 g/dL (ref 3.5–5.0)
Alkaline Phosphatase: 75 U/L (ref 38–126)
Bilirubin, Direct: 0.1 mg/dL (ref 0.0–0.2)
Indirect Bilirubin: 0.1 mg/dL — ABNORMAL LOW (ref 0.3–0.9)
Total Bilirubin: 0.2 mg/dL (ref <1.2)
Total Protein: 6.9 g/dL (ref 6.5–8.1)

## 2023-09-20 LAB — URINALYSIS, ROUTINE W REFLEX MICROSCOPIC
Bilirubin Urine: NEGATIVE
Glucose, UA: NEGATIVE mg/dL
Ketones, ur: NEGATIVE mg/dL
Nitrite: NEGATIVE
Protein, ur: NEGATIVE mg/dL
Specific Gravity, Urine: 1.004 — ABNORMAL LOW (ref 1.005–1.030)
pH: 6 (ref 5.0–8.0)

## 2023-09-20 LAB — BASIC METABOLIC PANEL
Anion gap: 7 (ref 5–15)
BUN: 17 mg/dL (ref 8–23)
CO2: 28 mmol/L (ref 22–32)
Calcium: 9.1 mg/dL (ref 8.9–10.3)
Chloride: 106 mmol/L (ref 98–111)
Creatinine, Ser: 1.13 mg/dL — ABNORMAL HIGH (ref 0.44–1.00)
GFR, Estimated: 49 mL/min — ABNORMAL LOW (ref >60)
Glucose, Bld: 149 mg/dL — ABNORMAL HIGH (ref 70–99)
Potassium: 4 mmol/L (ref 3.5–5.1)
Sodium: 141 mmol/L (ref 135–145)

## 2023-09-20 LAB — LIPASE, BLOOD: Lipase: 35 U/L (ref 11–51)

## 2023-09-20 MED ORDER — NITROFURANTOIN MONOHYD MACRO 100 MG PO CAPS: 100.0000 mg | ORAL_CAPSULE | Freq: Two times a day (BID) | ORAL | 0 refills | Status: AC

## 2023-09-20 NOTE — Discharge Instructions (Addendum)
Your work appears reassuring today.  Your blood counts and creatinine (measure of kidney function) are at your baseline.  Your pancreas and liver labs are normal.  Your electrolytes were normal.  We have sent your urine off for culture.  You are being treated for likely UTI given your symptoms.  You have been prescribed Macrobid (Nitrofurantoin). Take this antibiotic 2 times a day for the next 5 days. Take the full course of your antibiotic even if you start feeling better. Antibiotics may cause you to have diarrhea.  You will be contacted if your antibiotic needs to be changed based on the culture results.  Return the ER for any fevers, chills, worsening of your pain, any other new or concerning symptoms.

## 2023-09-20 NOTE — ED Notes (Signed)
CBG at home 89 prior to breakfast and 114 after.

## 2023-09-20 NOTE — ED Notes (Signed)
Bladder scanner showing 0. Pt visibly wet.

## 2023-09-20 NOTE — ED Provider Notes (Signed)
[place] EMERGENCY DEPARTMENT AT [facility] Provider Note   CSN: [id number] Arrival date & time: 09/20/23  1135     History  Chief Complaint  Patient presents with   Abdominal Pain    Laura Atkins is a 82 y.o. female with history of type 2 diabetes, frequent UTIs, presents with concern for lower abdominal pain that started over the last day.  States it is very uncomfortable to urinate.  Denies any fevers, chills, or flank pain.  Family member at bedside also reports patient has been having some diarrhea over the last couple of days.   Abdominal Pain      Home Medications Prior to Admission medications   Medication Sig Start Date End Date Taking? Authorizing Provider  nitrofurantoin, macrocrystal-monohydrate, (MACROBID) 100 MG capsule Take 1 capsule (100 mg total) by mouth 2 (two) times daily for 5 days. 09/20/23 09/25/23 Yes Arabella Merles, PA-C  acetaminophen (TYLENOL) 325 MG tablet Take 2 tablets (650 mg total) by mouth every 6 (six) hours as needed for mild pain, fever or headache. 06/22/23   Lonia Blood, MD  amiodarone (PACERONE) 200 MG tablet TAKE 1 TABLET BY MOUTH DAILY 07/31/23   Kerri Perches, MD  aspirin 81 MG tablet Take 81 mg by mouth daily.    [provider]  atorvastatin (LIPITOR) 40 MG tablet TAKE 1 TABLET BY MOUTH AT BEDTIME 09/19/23   Kerri Perches, MD  Cholecalciferol 125 MCG (5000 UT) TABS Take 5,000 Units by mouth daily.    [provider]  citalopram (CELEXA) 10 MG tablet TAKE 1 TABLET BY MOUTH DAILY 07/31/23   Kerri Perches, MD  Cyanocobalamin (B-12) 1000 MCG TABS TAKE 1 TABLET BY MOUTH EVERY DAY 07/31/23   Kerri Perches, MD  diphenhydrAMINE (BENADRYL ALLERGY) 25 MG tablet Take one tablet at bedtime 07/02/23   Kerri Perches, MD  gabapentin (NEURONTIN) 300 MG capsule TAKE ONE CAPSULE BY MOUTH NIGHTLY 07/31/23   Kerri Perches, MD  mirtazapine (REMERON SOL-TAB) 15 MG disintegrating tablet  Take 15 mg by mouth at bedtime.    [provider]  mirtazapine (REMERON) 15 MG tablet TAKE 1 TABLET BY MOUTH AT BEDTIME 08/20/23   Kerri Perches, MD  OVER THE COUNTER MEDICATION Probiotic one bid    [provider]  pantoprazole (PROTONIX) 40 MG tablet TAKE 1 TABLET BY MOUTH DAILY 07/31/23   Kerri Perches, MD  potassium chloride SA (KLOR-CON M20) 20 MEQ tablet Take 40 mEq by mouth 2 (two) times daily.    [provider]      Allergies    Penicillins, Prednisone, Propoxyphene n-acetaminophen, Gentamicin, and Sulfa antibiotics    Review of Systems   Review of Systems  Gastrointestinal:  Positive for abdominal pain.    Physical Exam Updated Vital Signs BP (!) 173/51 (BP Location: Left Arm)   Pulse 67   Temp 98.3 F (36.8 C) (Oral)   Resp 14   Ht 5\' 7"  (1.702 m)   Wt 76.2 kg   SpO2 100%   BMI 26.31 kg/m  Physical Exam Vitals and nursing note reviewed.  Constitutional:      General: She is not in acute distress.    Appearance: She is well-developed.     Comments: Well-appearing, no vomiting  HENT:     Head: Normocephalic and atraumatic.  Eyes:     Conjunctiva/sclera: Conjunctivae normal.  Cardiovascular:     Rate and Rhythm: Normal rate and regular rhythm.  Heart sounds: No murmur heard. Pulmonary:     Effort: Pulmonary effort is normal. No respiratory distress.     Breath sounds: Normal breath sounds.  Abdominal:     Palpations: Abdomen is soft.     Tenderness: There is no abdominal tenderness.     Comments: Mild tenderness palpation in the suprapubic region, abdomen soft and nontender elsewhere  No CVA tenderness bilaterally  Musculoskeletal:        General: No swelling.     Cervical back: Neck supple.  Skin:    General: Skin is warm and dry.     Capillary Refill: Capillary refill takes less than 2 seconds.  Neurological:     Mental Status: She is alert.  Psychiatric:        Mood and Affect: Mood normal.     ED  Results / Procedures / Treatments   Labs (all labs ordered are listed, but only abnormal results are displayed) Labs Reviewed  CBC WITH DIFFERENTIAL/PLATELET - Abnormal; Notable for the following components:      Result Value   RBC 3.82 (*)    Hemoglobin 8.3 (*)    HCT 29.1 (*)    MCV 76.2 (*)    MCH 21.7 (*)    MCHC 28.5 (*)    All other components within normal limits  BASIC METABOLIC PANEL - Abnormal; Notable for the following components:   Glucose, Bld 149 (*)    Creatinine, Ser 1.13 (*)    GFR, Estimated 49 (*)    All other components within normal limits  HEPATIC FUNCTION PANEL - Abnormal; Notable for the following components:   Indirect Bilirubin 0.1 (*)    All other components within normal limits  LIPASE, BLOOD  URINALYSIS, ROUTINE W REFLEX MICROSCOPIC  URINALYSIS, W/ REFLEX TO CULTURE (INFECTION SUSPECTED)    EKG None  Radiology No results found.  Procedures Procedures    Medications Ordered in ED Medications - No data to display  ED Course/ Medical Decision Making/ A&P                                 Medical Decision Making Amount and/or Complexity of Data Reviewed Labs: ordered.  Risk Prescription drug management.     Differential diagnosis includes but is not limited to Cholelithiasis, cholangitis, choledocholithiasis, peptic ulcer, gastritis, gastroenteritis, appendicitis, IBS, IBD, DKA, nephrolithiasis, UTI, pyelonephritis, pancreatitis, diverticulitis, mesenteric ischemia, abdominal aortic aneurysm, small bowel obstruction, volvulus, testicular torsion, ovarian torsion, and females of childbearing age pregnancy   ED Course:  Patient well-appearing, stable vital signs.  She is slightly tender to palpation in the suprapubic region, but no rebound or guarding.  No abdominal tenderness elsewhere.  No CVA tenderness.  No nausea or vomiting.  CBC without leukocytosis, BMP unremarkable.  Hepatic function panel and Lipase within normal limits.   Patient was not able to provide urine sample due to baseline incontinence, she was in and out cathed by nursing to get urine sample.  Given the suprapubic discomfort and pain with urination, and history of UTIs, will treat for likely cystitis.  I discussed that she will be contacted if her antibiotic needs to be changed based off the culture results.  Low concern for any other acute abdominal pathology at this time.  Patient stable and appropriate for discharge home.  Impression: Suprapubic pain Likely cystitis/UTI  Disposition:  The patient was discharged home with instructions to take 5 day course of Macrobid.  Return precautions given.  Lab Tests: I Ordered, and personally interpreted labs.  The pertinent results include:   CBC, BMP, hepatic function panel, lipase unremarkable. Urinalysis pending             Final Clinical Impression(s) / ED Diagnoses Final diagnoses:  Cystitis    Rx / DC Orders ED Discharge Orders          Ordered    nitrofurantoin, macrocrystal-monohydrate, (MACROBID) 100 MG capsule  2 times daily        09/20/23 1813              Arabella Merles, PA-C 09/20/23 1819    Terrilee Files, MD 09/21/23 1234

## 2023-09-20 NOTE — ED Triage Notes (Signed)
Pt with kidney disease hx, sent by her Dr. Lodema Hong.  Pt with hx cardiac arrest September 2024. Pain to abdomen since yesterday.

## 2023-10-18 ENCOUNTER — Ambulatory Visit: Payer: Self-pay | Admitting: Family Medicine

## 2023-10-18 NOTE — Telephone Encounter (Signed)
Copied from CRM [id number]. Topic: Clinical - Red Word Triage >> Oct 18, 2023  1:14 PM Alvino Blood C wrote: Red Word that prompted transfer to Nurse Triage: Patient believes she has a UTI pain during urination   Chief Complaint: Pain with Urination Symptoms: Dysuria Frequency: Acute Pertinent Negatives: Patient denies fever Disposition: [] ED /[x] Urgent Care (no appt availability in office) / [] Appointment(In office/virtual)/ []  [facility]/ [] Home Care/ [] Refused Recommended Disposition /[] [facility]/ []  Follow-up with PCP Additional Notes: FA is a 83 year old female being triaged today for dysuria, and flank pain. The patient states it started yesterday. The patient's daughter spoke with this nurse for triage. Recommended urgent care due to lack of in office availability. Agreed to disposition, will go when transportation arrives via other daughter.    Reason for Disposition  Urinating more frequently than usual (i.e., frequency)  Answer Assessment - Initial Assessment Questions 1. SYMPTOM: "What's the main symptom you're concerned about?" (e.g., frequency, incontinence)     Dysuria, Frequency  2. ONSET: "When did the  dysuria  start?"     Yesterday  3. PAIN: "Is there any pain?" If Yes, ask: "How bad is it?" (Scale: 1-10; mild, moderate, severe)      Pain with urination  4. CAUSE: "What do you think is causing the symptoms?"     UTI  5. OTHER SYMPTOMS: "Do you have any other symptoms?" (e.g., blood in urine, fever, flank pain, pain with urination)     Pain with urination  6. PREGNANCY: "Is there any chance you are pregnant?" "When was your last menstrual period?"     No  Protocols used: Urinary Symptoms-A-AH

## 2023-10-20 ENCOUNTER — Other Ambulatory Visit: Payer: Self-pay | Admitting: Family Medicine

## 2023-10-22 ENCOUNTER — Ambulatory Visit: Payer: Medicare PPO | Admitting: Podiatry

## 2023-10-22 DIAGNOSIS — E1142 Type 2 diabetes mellitus with diabetic polyneuropathy: Secondary | ICD-10-CM

## 2023-10-22 DIAGNOSIS — L97521 Non-pressure chronic ulcer of other part of left foot limited to breakdown of skin: Secondary | ICD-10-CM | POA: Diagnosis not present

## 2023-10-22 DIAGNOSIS — E11621 Type 2 diabetes mellitus with foot ulcer: Secondary | ICD-10-CM | POA: Diagnosis not present

## 2023-10-22 DIAGNOSIS — L03115 Cellulitis of right lower limb: Secondary | ICD-10-CM

## 2023-10-22 DIAGNOSIS — R262 Difficulty in walking, not elsewhere classified: Secondary | ICD-10-CM

## 2023-10-22 DIAGNOSIS — L89611 Pressure ulcer of right heel, stage 1: Secondary | ICD-10-CM

## 2023-10-22 MED ORDER — DOXYCYCLINE HYCLATE 100 MG PO CAPS: 100.0000 mg | ORAL_CAPSULE | Freq: Two times a day (BID) | ORAL | 0 refills | Status: AC

## 2023-10-22 NOTE — Progress Notes (Signed)
Chief Complaint  Patient presents with   Diabetic Ulcer    DB Ulcer, Left Great, RIGHT HEEL, POSSIBLE ULCER Starting. Last A1c was 6.5 take ASA 81.  Unsure of how long it has been there. Also, requesting nail care, I did tell them that maybe a sep apt. Daughter and grand daughter are with her today, patient has dementia.    HPI: 83 y.o. diabetic female with Stage 4 CKD presents today with two family members, one recording me with her cell phone, with c/o a sore area developing on the right heel, which has been tender when walking.  Also has c/o thick callus left great toe.  Denies wearing shoes too small.  Family notified me that patient has dementia and is supposed to use her walker, but she doesn't.  They expressed that she'll walk without it, but be leaning forward a lot, looking down, and therefore is walking on her toes.    Past Medical History:  Diagnosis Date   Alpha thalassemia trait 01/26/2010   02/2012: Nl CBC ex H&H-10.7/34.8, MCV-69    Anemia    Anxiety    Anxiety and depression    Cellulitis 05/09/2017   Depression    Diabetes mellitus    Foot pain, right 04/30/2013   GERD (gastroesophageal reflux disease)    Headache(784.0)    Hyperlipidemia    Hypertension    Iron deficiency 01/21/2017   Microcytic anemia 01/26/2010   02/2012: Nl CBC ex H&H-10.7/34.8, MCV-69    NECK PAIN, CHRONIC 10/21/2008   +chronic back pain     Obesity    Obstructive sleep apnea    Osteoarthritis    Left knee; right shoulder; chronic neck and back pain   Pruritus    PVD (peripheral vascular disease) (HCC) 01/28/2014   Seizures (HCC)    Shoulder pain, right 04/14/2015   Tremor    This started months ago after her seizure progressing to very poor hand writing   Uncontrolled type 2 diabetes mellitus with hypoglycemia, with long-term current use of insulin (HCC) 06/12/2022   Urinary incontinence    UTI (urinary tract infection) 01/18/2013    Past Surgical History:  Procedure Laterality  Date   ABDOMINAL HYSTERECTOMY     AMPUTATION TOE Right 12/20/2022   Procedure: RIGHT TOE AMPUTATION TOE;  Surgeon: Louann Sjogren, DPM;  Location: [facility] OR;  Service: Podiatry;  Laterality: Right;   BREAST EXCISIONAL BIOPSY     Left; cyst   CATARACT EXTRACTION Right    12/2017   CATARACT EXTRACTION W/ INTRAOCULAR LENS IMPLANT Left 09/07/2013   CHOLECYSTECTOMY     COLONOSCOPY     COLONOSCOPY N/A 07/20/2015   Procedure: COLONOSCOPY;  Surgeon: Malissa Hippo, MD;  Location: [facility] SUITE;  Service: Endoscopy;  Laterality: N/A;  930   EYE SURGERY Left 09/07/2013   cataract    Allergies  Allergen Reactions   Penicillins Shortness Of Breath, Itching and Rash   Prednisone Shortness Of Breath, Itching and Rash   Propoxyphene N-Acetaminophen Itching and Nausea And Vomiting    Darvocet    Gentamicin Rash    Cream   Sulfa Antibiotics Itching and Nausea And Vomiting   Smoker?: non-smoker   PHYSICAL EXAM: General: The patient is alert and oriented x3 in no acute distress.  Dermatology: On the posteromedial right heel, near plantar junction, there is hyperpigmentation with some peeling skin in the area.  This is approximately 3cm diameter.  There is calor and dolor present.   No  open lesions noted in the right heel.  POP of the heel in the area of hyperpigmentation.  No fluctuance noted.      Wound 1:  Location:  distal left hallux        Depth:  to dermis         Wound Border:  hyperkeratotic with thick hyperkeratotic covering        Wound Base:  macerated tissue        Drainage: none       Odor?:  no        Surrounding Tissue:  no erythema or calor noted.        Infected?:  no        Necrosis?:  no        Pain?:  no        Tunneling:  no       Dimensions (cm):  0.9 x 0.7 x 0.1cm  Vascular: Pedal pulses are palpable.  Neurological: Light touch sensation absent to toes bilateral.  Protective sensation diminished to toes with SW monofilament.   Musculoskeletal Exam: Ankle df less  than 10 degrees with knee extended bilateral.     Latest Ref Rng & Units 06/18/2023    8:31 PM  Hemoglobin A1C  Hemoglobin-A1c 4.8 - 5.6 % 6.5    ASSESSMENT / PLAN OF CARE: 1. Type 2 diabetes mellitus with foot ulcer (CODE) (HCC)   2. Pressure injury of right heel, stage 1   3. Skin ulcer of left great toe, limited to breakdown of skin (HCC)   4. Cellulitis of right foot   5. Difficulty walking   6. Diabetic polyneuropathy associated with type 2 diabetes mellitus (HCC)      Meds ordered this encounter  Medications   doxycycline (VIBRAMYCIN) 100 MG capsule    Sig: Take 1 capsule (100 mg total) by mouth 2 (two) times daily for 10 days.    Dispense:  20 capsule    Refill:  0   FOR HOME USE ONLY DME OTHER SEE COMMENT  The ulceration was sharply debrided of hyperkeratotic and devitalized soft tissue with sterile #312 blade to the level of dermis  .  Hemostasis obtained.  Antibiotic ointment and DSD applied.  Reviewed off-loading with patient.  Surgical shoe dispensed for patient to wear at all times WB for left foot. A heel off-loading surgical shoe was dispensed for right heel.  Do not recommend closed toe shoes at this time.  Patient to limit WB activities for now since she is at risk for falls with these surgical shoes.  Rx multipodus, or HeeLift foam, boot to wear when sitting or sleeping to keep pressure off right heel.  They can get this dispensed at a medical supply store.   Reviewed daily dressing changes with patient.  The family will apply betadine solution to the hallux wound, followed by gauze dressing daily.    Patient advised to use her walker around her home, to try and keep some pressure off the toes.    Rx doxycycline for the right heel cellulitis.  It is imperative that pressure be kept off the right heel area to prevent skin breakdown.  Due to patient's neuropathy, she is at higher risk for ulceration since she typically doesn't feel much pain to her  feet/heels.  Discussed risks / concerns regarding ulcer with patient and possible sequelae if left untreated.  Stressed importance of infection prevention at home.  Significant time taken to discuss this since there were  many questions and interruptions by patient and family. Short-term goals are: resolve infection, off-load ulcer, heal ulcer Long-term goals are:  prevent recurrence, prevent amputation.   Return in about 2 weeks (around 11/05/2023) for f/u ulcer.  If time allows, may perform nail care if needed at next appt.    Clerance Lav, DPM, FACFAS [facility]     [address] [place], [place] [address]                Office [phone]  Fax [phone]

## 2023-10-29 ENCOUNTER — Ambulatory Visit (INDEPENDENT_AMBULATORY_CARE_PROVIDER_SITE_OTHER): Payer: Medicare PPO | Admitting: Family Medicine

## 2023-10-29 ENCOUNTER — Encounter: Payer: Self-pay | Admitting: Family Medicine

## 2023-10-29 ENCOUNTER — Ambulatory Visit: Payer: Medicare PPO | Admitting: Student

## 2023-10-29 VITALS — BP 163/79 | HR 71 | Ht 67.0 in | Wt 208.0 lb

## 2023-10-29 DIAGNOSIS — N390 Urinary tract infection, site not specified: Secondary | ICD-10-CM | POA: Diagnosis not present

## 2023-10-29 DIAGNOSIS — E785 Hyperlipidemia, unspecified: Secondary | ICD-10-CM

## 2023-10-29 DIAGNOSIS — I10 Essential (primary) hypertension: Secondary | ICD-10-CM | POA: Diagnosis not present

## 2023-10-29 DIAGNOSIS — E1149 Type 2 diabetes mellitus with other diabetic neurological complication: Secondary | ICD-10-CM

## 2023-10-29 DIAGNOSIS — F419 Anxiety disorder, unspecified: Secondary | ICD-10-CM | POA: Diagnosis not present

## 2023-10-29 DIAGNOSIS — E1169 Type 2 diabetes mellitus with other specified complication: Secondary | ICD-10-CM | POA: Diagnosis not present

## 2023-10-29 DIAGNOSIS — F03C11 Unspecified dementia, severe, with agitation: Secondary | ICD-10-CM | POA: Diagnosis not present

## 2023-10-29 DIAGNOSIS — R269 Unspecified abnormalities of gait and mobility: Secondary | ICD-10-CM

## 2023-10-29 MED ORDER — CLONAZEPAM 0.25 MG PO TBDP: ORAL_TABLET | ORAL | 1 refills | Status: DC

## 2023-10-29 NOTE — Progress Notes (Deleted)
   Cardiology Office Note    Date:  10/29/2023  ID:  Laura Atkins, DOB 1940/11/01, MRN [id number] Cardiologist: Alvan Carrier, MD    History of Present Illness:    Laura Atkins is a 83 y.o. female with past medical history of VT (occurring in 05/2023 and started on amiodarone  with cardiac catheterization not pursued given advanced age, dementia and renal failure), Stage III CKD, HTN, HLD and alpha thalassemia who presents to the office today for 56-month follow-up.  She was examined by Dr. Alvan in 06/2023 following her recent hospitalization for VT and was overall doing well at that time.  An outpatient monitor had previously been recommended but she pulled this off due to dementia and agitation and it was recommended not to retry this.  Was recommended to continue to avoid AV nodal blocking agents and to continue on amiodarone , likely indefinitely.  ROS: ***  Studies Reviewed:   EKG: EKG is*** ordered today and demonstrates ***   EKG Interpretation Date/Time:    Ventricular Rate:    PR Interval:    QRS Duration:    QT Interval:    QTC Calculation:   R Axis:      Text Interpretation:           Risk Assessment/Calculations:   {Does this patient have ATRIAL FIBRILLATION?:[phone]} No BP recorded.  {Refresh Note OR Click here to enter BP  :1}***         Physical Exam:   VS:  There were no vitals taken for this visit.   Wt Readings from Last 3 Encounters:  09/20/23 168 lb (76.2 kg)  08/01/23 212 lb 3.2 oz (96.3 kg)  06/21/23 200 lb (90.7 kg)     GEN: Well nourished, well developed in no acute distress NECK: No JVD; No carotid bruits CARDIAC: ***RRR, no murmurs, rubs, gallops RESPIRATORY:  Clear to auscultation without rales, wheezing or rhonchi  ABDOMEN: Appears non-distended. No obvious abdominal masses. EXTREMITIES: No clubbing or cyanosis. No edema.  Distal pedal pulses are 2+ bilaterally.   Assessment and Plan:      {Are you ordering a CV  Procedure (e.g. stress test, cath, DCCV, TEE, etc)?   Press F2        :[id number]}   Signed, Laymon CHRISTELLA Qua, PA-C

## 2023-10-29 NOTE — Patient Instructions (Addendum)
 F/U in 7 weeks  Nurse pls place urgent referral for case manager needs help with placement for Dementia with behavioral disturbance, contact Reena   Pls give cup for urine c/s to be returned if pt symptomatic after antibiotic completion  Klonopin  one daily as needed for anxity / agitiation is prescribed  Careful regarding falls, use your walker  Thanks for choosing [facility], we consider it a privelige to serve you.

## 2023-10-30 ENCOUNTER — Telehealth: Payer: Self-pay | Admitting: *Deleted

## 2023-10-30 NOTE — Progress Notes (Signed)
 Complex Care Management Note  Care Guide Note 10/30/2023 Name: CIERAH CRADER MRN: [id number] DOB: 1941/01/10  JEANNE DIEFENDORF is a 83 y.o. year old female who sees Antonetta Rollene BRAVO, MD for primary care. I reached out to Cathlean KANDICE Clause daughter Maraya Gwilliam DPR on file by phone today to offer complex care management services.  Ms. Theilen daughter Eknoor Novack [facility] on file was given information about Complex Care Management services today including:   The Complex Care Management services include support from the care team which includes your Nurse Care Manager, Clinical Social Worker, or Pharmacist.  The Complex Care Management team is here to help remove barriers to the health concerns and goals most important to you. Complex Care Management services are voluntary, and the patient may decline or stop services at any time by request to their care team member.   Complex Care Management Consent Status: Patient daughter Kenetra Hildenbrand [facility] on file agreed to services and verbal consent obtained.   Follow up plan:  Telephone appointment with complex care management team member scheduled for:  2/20  Encounter Outcome:  Patient Scheduled  Harlene Satterfield  [facility]  [facility] Guide  Direct Dial: [phone]  Fax [phone]

## 2023-11-04 ENCOUNTER — Ambulatory Visit: Payer: Medicare PPO | Admitting: Nurse Practitioner

## 2023-11-04 ENCOUNTER — Encounter: Payer: Self-pay | Admitting: Nurse Practitioner

## 2023-11-04 VITALS — BP 134/58 | HR 56 | Ht 67.0 in | Wt 208.0 lb

## 2023-11-04 DIAGNOSIS — Z794 Long term (current) use of insulin: Secondary | ICD-10-CM

## 2023-11-04 DIAGNOSIS — N1832 Chronic kidney disease, stage 3b: Secondary | ICD-10-CM

## 2023-11-04 DIAGNOSIS — E782 Mixed hyperlipidemia: Secondary | ICD-10-CM | POA: Diagnosis not present

## 2023-11-04 DIAGNOSIS — E1122 Type 2 diabetes mellitus with diabetic chronic kidney disease: Secondary | ICD-10-CM

## 2023-11-04 DIAGNOSIS — I10 Essential (primary) hypertension: Secondary | ICD-10-CM

## 2023-11-04 LAB — POCT GLYCOSYLATED HEMOGLOBIN (HGB A1C): Hemoglobin A1C: 6.1 % — AB (ref 4.0–5.6)

## 2023-11-04 NOTE — Progress Notes (Signed)
 Endocrinology Follow Up Note       11/04/2023, 1:35 PM   Subjective:    Patient ID: Laura Atkins, female    DOB: [birth year].  Laura Atkins is being seen in follow up after being seen in consultation for management of currently uncontrolled symptomatic diabetes requested by  Towanda Fret, MD.   Past Medical History:  Diagnosis Date   Alpha thalassemia trait 01/26/2010   02/2012: Nl CBC ex H&H-10.7/34.8, MCV-69    Anemia    Anxiety    Anxiety and depression    Cellulitis 05/09/2017   Depression    Diabetes mellitus    Foot pain, right 04/30/2013   GERD (gastroesophageal reflux disease)    Headache(784.0)    Hyperlipidemia    Hypertension    Iron  deficiency 01/21/2017   Microcytic anemia 01/26/2010   02/2012: Nl CBC ex H&H-10.7/34.8, MCV-69    NECK PAIN, CHRONIC 10/21/2008   +chronic back pain     Obesity    Obstructive sleep apnea    Osteoarthritis    Left knee; right shoulder; chronic neck and back pain   Pruritus    PVD (peripheral vascular disease) (HCC) 01/28/2014   Seizures (HCC)    Shoulder pain, right 04/14/2015   Tremor    This started months ago after her seizure progressing to very poor hand writing   Uncontrolled type 2 diabetes mellitus with hypoglycemia, with long-term current use of insulin  (HCC) 06/12/2022   Urinary incontinence    UTI (urinary tract infection) 01/18/2013    Past Surgical History:  Procedure Laterality Date   ABDOMINAL HYSTERECTOMY     AMPUTATION TOE Right 12/20/2022   Procedure: RIGHT TOE AMPUTATION TOE;  Surgeon: Jennefer Moats, DPM;  Location: [facility] OR;  Service: Podiatry;  Laterality: Right;   BREAST EXCISIONAL BIOPSY     Left; cyst   CATARACT EXTRACTION Right    12/2017   CATARACT EXTRACTION W/ INTRAOCULAR LENS IMPLANT Left 09/07/2013   CHOLECYSTECTOMY     COLONOSCOPY     COLONOSCOPY N/A 07/20/2015   Procedure: COLONOSCOPY;  Surgeon: Ruby Corporal, MD;  Location: [facility];  Service: Endoscopy;  Laterality: N/A;  930   EYE SURGERY Left 09/07/2013   cataract    Social History   Socioeconomic History   Marital status: Married    Spouse name: saunders   Number of children: 5   Years of education: 12   Highest education level: Not on file  Occupational History   Occupation: Disabled     Employer: RETIRED  Tobacco Use   Smoking status: Never    Passive exposure: Past   Smokeless tobacco: Never  Vaping Use   Vaping status: Never Used  Substance and Sexual Activity   Alcohol use: No    Alcohol/week: 0.0 standard drinks of alcohol   Drug use: No   Sexual activity: Yes    Birth control/protection: Surgical  Other Topics Concern   Not on file  Social History Narrative   07/27/20 Patient lives at home with her husband Earma Gloss) and dgtr- Genevia Kern.  Patient is retired.    Right handed.    Five Children.    Caffeine- 2 daily  Social Drivers of Corporate investment banker Strain: Low Risk  (04/01/2023)   Received from [facility]   Overall Financial Resource Strain (CARDIA)    Difficulty of Paying Living Expenses: Not hard at all  Food Insecurity: No Food Insecurity (06/19/2023)   Hunger Vital Sign    Worried About Running Out of Food in the Last Year: Never true    Ran Out of Food in the Last Year: Never true  Transportation Needs: No Transportation Needs (06/19/2023)   PRAPARE - Administrator, Civil Service (Medical): No    Lack of Transportation (Non-Medical): No  Physical Activity: Inactive (04/18/2022)   Exercise Vital Sign    Days of Exercise per Week: 0 days    Minutes of Exercise per Session: 0 min  Stress: No Stress Concern Present (04/18/2022)   [employer] of Occupational Health - Occupational Stress Questionnaire    Feeling of Stress : Not at all  Social Connections: Moderately Integrated (04/18/2022)   Social Connection and Isolation Panel [NHANES]    Frequency of Communication  with Friends and Family: More than three times a week    Frequency of Social Gatherings with Friends and Family: More than three times a week    Attends Religious Services: More than 4 times per year    Active Member of [employer] or Organizations: No    Attends Engineer, structural: Never    Marital Status: Married    Family History  Problem Relation Age of Onset   Lung cancer Mother 49   Kidney disease Father    Diabetes Sister    Keloids Brother    ADD / ADHD Grandchild    Bipolar disorder Grandchild    Bipolar disorder Daughter    Seizures Daughter    Heart disease Daughter    Kidney disease Son    Neuropathy Son    Kidney disease Son    Edema Daughter    Breast cancer Daughter 76   Allergies Daughter    Alcohol abuse Neg Hx    Drug abuse Neg Hx     Outpatient Encounter Medications as of 11/04/2023  Medication Sig   acetaminophen  (TYLENOL ) 325 MG tablet Take 2 tablets (650 mg total) by mouth every 6 (six) hours as needed for mild pain, fever or headache.   amiodarone  (PACERONE ) 200 MG tablet TAKE 1 TABLET BY MOUTH DAILY   aspirin  81 MG tablet Take 81 mg by mouth daily.   atorvastatin  (LIPITOR) 40 MG tablet TAKE 1 TABLET BY MOUTH AT BEDTIME   Cephalexin  500 MG tablet Take 500 mg by mouth once.   Cholecalciferol 125 MCG (5000 UT) TABS Take 5,000 Units by mouth daily.   citalopram (CELEXA) 10 MG tablet TAKE 1 TABLET BY MOUTH DAILY   clonazePAM  (KLONOPIN ) 0.25 MG disintegrating tablet Take one tablet by mouth once daily as needed for anxiety   Cyanocobalamin  (B-12) 1000 MCG TABS TAKE 1 TABLET BY MOUTH EVERY DAY   diphenhydrAMINE  (BENADRYL  ALLERGY) 25 MG tablet Take one tablet at bedtime   gabapentin  (NEURONTIN ) 300 MG capsule TAKE ONE CAPSULE BY MOUTH NIGHTLY   mirtazapine  (REMERON  SOL-TAB) 15 MG disintegrating tablet Take 15 mg by mouth at bedtime.   mirtazapine  (REMERON ) 15 MG tablet TAKE 1 TABLET BY MOUTH AT BEDTIME   OVER THE COUNTER MEDICATION Probiotic one bid    pantoprazole  (PROTONIX ) 40 MG tablet TAKE 1 TABLET BY MOUTH DAILY   potassium chloride  SA (KLOR-CON  M20) 20 MEQ tablet Take  40 mEq by mouth 2 (two) times daily.   [EXPIRED] doxycycline  (VIBRAMYCIN ) 100 MG capsule Take 1 capsule (100 mg total) by mouth 2 (two) times daily for 10 days.   No facility-administered encounter medications on file as of 11/04/2023.    ALLERGIES: Allergies  Allergen Reactions   Penicillins Shortness Of Breath, Itching and Rash   Prednisone Shortness Of Breath, Itching and Rash   Propoxyphene N-Acetaminophen  Itching and Nausea And Vomiting    Darvocet    Gentamicin  Rash    Cream   Sulfa  Antibiotics Itching and Nausea And Vomiting    VACCINATION STATUS: Immunization History  Administered Date(s) Administered   Fluad Quad(high Dose 65+) 05/20/2019, 06/22/2022   Fluad Trivalent(High Dose 65+) 07/02/2023   H1N1 07/15/2008   Influenza Split 06/17/2012   Influenza Whole 07/03/2007, 06/17/2009, 06/07/2011   Influenza, High Dose Seasonal PF 07/17/2018   Influenza,inj,Quad PF,6+ Mos 07/13/2013, 09/06/2014, 08/10/2015, 05/23/2016, 05/22/2017   Pneumococcal Conjugate-13 05/03/2014   Pneumococcal Polysaccharide-23 09/08/2010   Td 09/08/2010   Tdap 07/17/2018   Unspecified SARS-COV-2 Vaccination 09/25/2019, 10/26/2019    Diabetes She presents for her follow-up diabetic visit. She has type 2 diabetes mellitus. Onset time: poor historian- was diagnosed in her early 42s. Her disease course has been improving. There are no hypoglycemic associated symptoms. Pertinent negatives for diabetes include no blurred vision, no fatigue, no polydipsia and no polyuria. There are no hypoglycemic complications. Symptoms are stable. Diabetic complications include a CVA, nephropathy, peripheral neuropathy and PVD. [facility] daughter reports CVA in past) Risk factors for coronary artery disease include diabetes mellitus, dyslipidemia, family history, hypertension, obesity, post-menopausal and  sedentary lifestyle. When asked about current treatments, none were reported. She is compliant with treatment most of the time. Her weight is fluctuating minimally. She is following a generally healthy diet. When asked about meal planning, she reported none. She has not had a previous visit with a dietitian. She rarely participates in exercise. (She presents today, accompanied by her daughter, with no meter or readings.  She was not asked to routinely monitor glucose as she was taken off her DM meds last visit.  Her POCT A1c today is 6.1%, improving from last visit of 6.5%.) An ACE inhibitor/angiotensin II receptor blocker is being taken. She sees a podiatrist.Eye exam is current.  Hypertension This is a chronic problem. The current episode started more than 1 year ago. The problem has been resolved since onset. The problem is controlled. Pertinent negatives include no blurred vision. There are no associated agents to hypertension. Risk factors for coronary artery disease include diabetes mellitus, dyslipidemia, family history, obesity, post-menopausal state and sedentary lifestyle. Past treatments include calcium  channel blockers and ACE inhibitors. The current treatment provides moderate improvement. Compliance problems include exercise and diet.  Hypertensive end-organ damage includes kidney disease, CVA and PVD. Identifiable causes of hypertension include chronic renal disease and sleep apnea.  Hyperlipidemia This is a chronic problem. The current episode started more than 1 year ago. The problem is controlled. Recent lipid tests were reviewed and are normal. Exacerbating diseases include chronic renal disease, diabetes and obesity. There are no known factors aggravating her hyperlipidemia. Current antihyperlipidemic treatment includes statins. The current treatment provides moderate improvement of lipids. Compliance problems include adherence to diet and adherence to exercise.  Risk factors for coronary  artery disease include diabetes mellitus, dyslipidemia, family history, hypertension, obesity, a sedentary lifestyle and post-menopausal.     Review of systems  Constitutional: +stable body weight, Body mass index is 32.58 kg/m., +  fatigue, no subjective hyperthermia, no subjective hypothermia, worsening memory impairment Eyes: no blurry vision, no xerophthalmia ENT: no sore throat, no nodules palpated in throat, no dysphagia/odynophagia, no hoarseness Cardiovascular: no chest pain, no shortness of breath, no palpitations, + intermittent leg swelling Respiratory: no cough, no shortness of breath Gastrointestinal: no nausea/vomiting/diarrhea Genitourinary: + polyuria and incontinence Musculoskeletal: Hx chronic pain; walks with walker, WC for long distances Skin: no rashes, no hyperemia Neurological: no tremors, no numbness, no tingling, no dizziness Psychiatric: no depression, no anxiety  Objective:     BP (!) 134/58 (BP Location: Left Arm, Patient Position: Sitting, Cuff Size: Large)   Pulse (!) 56   Ht 5\' 7"  (1.702 m)   Wt 208 lb (94.3 kg) Comment: per daughter, Genevia Kern  BMI 32.58 kg/m   Wt Readings from Last 3 Encounters:  11/04/23 208 lb (94.3 kg)  10/29/23 208 lb 0.6 oz (94.4 kg)  09/20/23 168 lb (76.2 kg)     BP Readings from Last 3 Encounters:  11/04/23 (!) 134/58  10/29/23 (!) 163/79  09/20/23 (!) 169/80     Physical Exam- Limited  Constitutional:  Body mass index is 32.58 kg/m. , not in acute distress, normal state of mind Eyes:  EOMI, no exophthalmos Musculoskeletal: no gross deformities, strength intact in all four extremities, no gross restriction of joint movements, walks with walker, WC for long distances, ortho shoes on bilateral feet. Skin:  no rashes, no hyperemia Neurological: no tremor with outstretched hands    CMP ( most recent) CMP     Component Value Date/Time   NA 141 09/20/2023 1224   NA 146 (H) 10/24/2021 1218   K 4.0 09/20/2023 1224    CL 106 09/20/2023 1224   CO2 28 09/20/2023 1224   GLUCOSE 149 (H) 09/20/2023 1224   BUN 17 09/20/2023 1224   BUN 42 (H) 10/24/2021 1218   CREATININE 1.13 (H) 09/20/2023 1224   CREATININE 2.02 (H) 03/22/2022 1534   CALCIUM  9.1 09/20/2023 1224   PROT 6.9 09/20/2023 1224   PROT 7.0 10/24/2021 1218   ALBUMIN 3.6 09/20/2023 1224   ALBUMIN 4.3 10/24/2021 1218   AST 21 09/20/2023 1224   ALT 16 09/20/2023 1224   ALT 16 03/22/2022 1534   ALKPHOS 75 09/20/2023 1224   BILITOT 0.2 09/20/2023 1224   BILITOT 0.2 10/24/2021 1218   GFRNONAA 49 (L) 09/20/2023 1224   GFRNONAA 40 (L) 12/01/2019 1137   GFRAA 55 (L) 06/03/2020 0210   GFRAA 46 (L) 12/01/2019 1137     Diabetic Labs (most recent): Lab Results  Component Value Date   HGBA1C 6.1 (A) 11/04/2023   HGBA1C 6.5 (H) 06/18/2023   HGBA1C 6.9 09/13/2022   MICROALBUR 33.9 11/26/2018   MICROALBUR 20.9 (H) 10/15/2016   MICROALBUR 0.8 03/14/2015     Lipid Panel ( most recent) Lipid Panel     Component Value Date/Time   CHOL 153 10/24/2021 1218   TRIG 136 10/24/2021 1218   HDL 60 10/24/2021 1218   CHOLHDL 2.6 10/24/2021 1218   CHOLHDL 2.6 12/01/2019 1137   VLDL 16 05/14/2017 1039   LDLCALC 70 10/24/2021 1218   LDLCALC 72 12/01/2019 1137   LABVLDL 23 10/24/2021 1218      Lab Results  Component Value Date   TSH 2.026 05/07/2023   TSH 2.750 10/24/2021   TSH 1.129 05/27/2020   TSH 2.24 12/01/2019   TSH 1.12 11/26/2018   TSH 2.01 05/14/2017   TSH 2.13 02/21/2016   TSH 0.989 08/18/2014  TSH 0.520 01/23/2012   TSH 1.315 09/08/2010           Assessment & Plan:   1) Controlled Type 2 Diabetes with CKD stage 3b-  She presents today, accompanied by her daughter, with no meter or readings.  She was not asked to routinely monitor glucose as she was taken off her DM meds last visit.  Her POCT A1c today is 6.1%, improving from last visit of 6.5%.  - WILLISTINE CRIDER has currently uncontrolled symptomatic type 2 DM since 83  years of age.   -Recent labs reviewed.  She has seen Dr. Carrolyn Clan in the past for CKD.  - I had a long discussion with her about the progressive nature of diabetes and the pathology behind its complications.  -her diabetes is complicated by CKD, CVA, neuropathy, PVD and she remains at a high risk for more acute and chronic complications which include CAD, CVA, CKD, retinopathy, and neuropathy. These are all discussed in detail with her.  - Nutritional counseling repeated at each appointment due to patients tendency to fall back in to old habits.  - The patient admits there is a room for improvement in their diet and drink choices. -  Suggestion is made for the patient to avoid simple carbohydrates from their diet including Cakes, Sweet Desserts / Pastries, Ice Cream, Soda (diet and regular), Sweet Tea, Candies, Chips, Cookies, Sweet Pastries, Store Bought Juices, Alcohol in Excess of 1-2 drinks a day, Artificial Sweeteners, Coffee Creamer, and "Sugar-free" Products. This will help patient to have stable blood glucose profile and potentially avoid unintended weight gain.   - I encouraged the patient to switch to unprocessed or minimally processed complex starch and increased protein intake (animal or plant source), fruits, and vegetables.   - Patient is advised to stick to a routine mealtimes to eat 3 meals a day and avoid unnecessary snacks (to snack only to correct hypoglycemia).  - I have approached her with the following individualized plan to manage her diabetes and patient agrees:   -Given her age and comorbidities, avoiding hypoglycemia will be the number 1 priority in her diabetes management.  An A1c of 7-7.5 will be acceptable for her.  Adjustments need to be made slowly in her case to avoid inadvertent hypoglycemia.  -She has managed well without any medications.  She can stay off meds at this time.  She does not need to routinely monitor glucose at home.  - she is not a candidate for  Metformin or SGLT2i due to concurrent renal insufficiency.  - she will be considered for incretin therapy as appropriate next visit.  - Specific targets for  A1c; LDL, HDL, and Triglycerides were discussed with the patient.  2) Blood Pressure /Hypertension:  her blood pressure is controlled to target for her age.   she is advised to continue her current medications as prescribed by her PCP.  3) Lipids/Hyperlipidemia:    Review of her recent lipid panel from 10/24/21 showed controlled LDL at 70 .  she is advised to continue Lipitor 40 mg daily at bedtime.  Side effects and precautions discussed with her.  4)  Weight/Diet:  her Body mass index is 32.58 kg/m.  -  clearly complicating her diabetes care.   she is a candidate for weight loss. I discussed with her the fact that loss of 5 - 10% of her  current body weight will have the most impact on her diabetes management.  Exercise, and detailed carbohydrates information provided  -  detailed on discharge instructions.  5) Chronic Care/Health Maintenance: -she is on ACEI/ARB and Statin medications and is encouraged to initiate and continue to follow up with Ophthalmology, Dentist, Podiatrist at least yearly or according to recommendations, and advised to stay away from smoking. I have recommended yearly flu vaccine and pneumonia vaccine at least every 5 years; moderate intensity exercise for up to 150 minutes weekly; and sleep for at least 7 hours a day.  - she is advised to maintain close follow up with Towanda Fret, MD for primary care needs, as well as her other providers for optimal and coordinated care.      I spent  31  minutes in the care of the patient today including review of labs from CMP, Lipids, Thyroid  Function, Hematology (current and previous including abstractions from other facilities); face-to-face time discussing  her blood glucose readings/logs, discussing hypoglycemia and hyperglycemia episodes and symptoms,  medications doses, her options of short and long term treatment based on the latest standards of care / guidelines;  discussion about incorporating lifestyle medicine;  and documenting the encounter. Risk reduction counseling performed per USPSTF guidelines to reduce obesity and cardiovascular risk factors.     Please refer to Patient Instructions for Blood Glucose Monitoring and Insulin /Medications Dosing Guide"  in media tab for additional information. Please  also refer to " Patient Self Inventory" in the Media  tab for reviewed elements of pertinent patient history.  Marye Smoker participated in the discussions, expressed understanding, and voiced agreement with the above plans.  All questions were answered to her satisfaction. she is encouraged to contact clinic should she have any questions or concerns prior to her return visit.     Follow up plan: - Return in about 6 months (around 05/03/2024) for Diabetes F/U with A1c in office.   Hulon Magic, [facility] [address] [place], [place] [address] Phone: [phone] Fax: [phone]  11/04/2023, 1:35 PM

## 2023-11-11 ENCOUNTER — Encounter (INDEPENDENT_AMBULATORY_CARE_PROVIDER_SITE_OTHER): Payer: Medicare PPO | Admitting: Podiatry

## 2023-11-11 ENCOUNTER — Telehealth: Payer: Self-pay | Admitting: *Deleted

## 2023-11-11 DIAGNOSIS — Z91199 Patient's noncompliance with other medical treatment and regimen due to unspecified reason: Secondary | ICD-10-CM

## 2023-11-11 NOTE — Patient Outreach (Signed)
  Care Coordination   11/11/2023  Name: Laura Atkins MRN: [id number] DOB: 1941-06-12   Care Coordination Outreach Attempts:  An unsuccessful telephone outreach was attempted today to offer the patient information about available complex care management services. HIPAA compliant message left on voicemail for patient's daughter, Laura Atkins providing contact information for CSW, encouraging her to return CSW's call at her earliest convenience.  Follow Up Plan:  Additional outreach attempts will be made to offer the patient complex care management information and services.   Encounter Outcome:  No Answer.   Care Coordination Interventions:  No, not indicated.     Danford Bad, BSW, MSW, LCSW [facility], [facility] Clinical Social Worker II Direct Dial: [phone]  Fax: [phone] Website: [email or web address]

## 2023-11-11 NOTE — Progress Notes (Signed)
 Patient did not show for her scheduled appointment this morning.

## 2023-11-12 ENCOUNTER — Ambulatory Visit: Payer: Self-pay | Admitting: *Deleted

## 2023-11-12 ENCOUNTER — Encounter: Payer: Self-pay | Admitting: *Deleted

## 2023-11-12 NOTE — Patient Outreach (Signed)
Care Coordination   Initial Visit Note   11/12/2023  Name: Laura Atkins MRN: [id number] DOB: [birth year]  Laura Atkins is a 83 y.o. year old female who sees Kerri Perches, MD for primary care. I spoke with patient's daughter, Charlcie Prisco by phone today.  What matters to the patients health and wellness today?  Receive Assistance Pursuing Higher Level of Care Placement Options.    Goals Addressed             This Visit's Progress    Receive Assistance Pursuing Higher Level of Care Placement Options.   On track    Care Coordination Interventions:  Interventions Today    Flowsheet Row Most Recent Value  Chronic Disease   Chronic disease during today's visit Diabetes, Hypertension (HTN), Chronic Kidney Disease/End Stage Renal Disease (ESRD), Other  [Sleep Terror Disorder, Seizure-Like Activiy, Morbid Obesity, Daytime Somnolence, Sleep Apnea, Limited Mobility, Insomnia, Anxiety, Depression, Inability to Perform Activities of Daily Living Independently, Caregiver Burnout, Stress, & Fatigue.]  General Interventions   General Interventions Discussed/Reviewed General Interventions Discussed, Labs, Vaccines, Doctor Visits, Health Screening, Annual Foot Exam, General Interventions Reviewed, Lipid Profile, Durable Medical Equipment (DME), Annual Eye Exam, Community Resources, Level of Care, Communication with  [Encouraged Routine Engagement with Care Team Members & Providers.]  Labs Hgb A1c every 3 months, Kidney Function, Hgb A1c annually  [Encouraged Routine Lab Work.]  Vaccines COVID-19, Flu, Pneumonia, RSV, Shingles, Tetanus/Pertussis/Diphtheria  [Encouraged Routine Vaccinations.]  Doctor Visits Discussed/Reviewed Doctor Visits Discussed, Specialist, Doctor Visits Reviewed, Annual Wellness Visits, PCP  [Encouraged Routine Engagement with Care Team Members & Providers.]  Health Screening Bone Density, Colonoscopy, Mammogram  [Encouraged Routine Health Screenings.]  Durable Medical  Equipment (DME) Bed side commode, BP Cuff, Glucomoter, Environmental consultant, Wheelchair, Other  [Scales, Medical laboratory scientific officer, Eyeglasses.]  Wheelchair Standard  PCP/Specialist Visits Compliance with follow-up visit  [Encouraged Routine Engagement with Care Team Members & Providers.]  Communication with PCP/Specialists, Charity fundraiser, Pharmacists, Social Work  [employer] Routine Engagement with Care Team Members & Providers.]  Level of Care Adult Daycare, Assisted Living, Skilled Nursing Facility, Air traffic controller, Personal Care Services  [Confirmed Disinterest in Enrollment in Adult Day Care Program. Confirmed Interest in Pursuing Higher Level of Care Placement Options (I.e Memory Care Assisted Living Facility).]  Applications Medicaid, [place]-2  [Confirmed Interest in Applying for Special Assistance Long-Term Care Medicaid & Having Primary Care Provider Complete FL-2 Form for Placement Purposes.]  Exercise Interventions   Exercise Discussed/Reviewed Exercise Discussed, Assistive device use and maintanence, Exercise Reviewed, Physical Activity, Weight Managment  [Encouraged Daily Exercise Regimen, as Tolerated.]  Physical Activity Discussed/Reviewed Physical Activity Discussed, Home Exercise Program (HEP), PREP, Gym, Physical Activity Reviewed, Types of exercise  [Encouraged Increased Level of Activity & Exercise, Inside & Outside the Home.]  Weight Management Weight loss  [Encouraged Healthy Weight Loss Regimen.]  Education Interventions   Education Provided Provided Therapist, sports, Provided Web-based Education, Provided Education  [Provided Educational Material & Thoroughly Reviewed to [employer] Understanding.]  Provided Verbal Education On Nutrition, Foot Care, Eye Care, Labs, Blood Sugar Monitoring, Mental Health/Coping with Illness, When to see the doctor, Applications, Exercise, Medication, Development worker, community, [employer] Resources  [Encouraged Continued Independent Review of Educational Material Provided.]  Labs Reviewed Hgb A1c  [Encouraged  Routine Monitoring of Blood Sugars.]  Applications Medicaid, [place]-2  [Confirmed Interest in Applying for Special Assistance Long-Term Care Medicaid & Having Primary Care Provider Complete FL-2 Form for Placement Purposes.]  Mental Health Interventions   Mental Health Discussed/Reviewed Mental Health Discussed, Mental Health Reviewed,  Coping Strategies, Crisis, Anxiety, Depression, Grief and Loss, Substance Abuse, Suicide, Other  [Assessed Mental Health & Cognitive Status. Offered Counseling & Supportive Services.]  Nutrition Interventions   Nutrition Discussed/Reviewed Supplemental nutrition, Decreasing sugar intake, Carbohydrate meal planning, Portion sizes, Decreasing salt, Fluid intake, Nutrition Reviewed, Decreasing fats, Increasing proteins, Adding fruits and vegetables, Nutrition Discussed  [Encouraged Heart-Healthy, Diabetic Friendly, Low Sodium, Reduced Fat, High Fiber, Renal-Friendly, Reduced Sugar Diet.]  Pharmacy Interventions   Pharmacy Dicussed/Reviewed Pharmacy Topics Discussed, Medications and their functions, Medication Adherence, Pharmacy Topics Reviewed, Affording Medications  [Confirmed Ability to Afford Prescription Medications.]  Medication Adherence --  [Confirmed Compliance with Prescription Medications.]  Safety Interventions   Safety Discussed/Reviewed Safety Discussed, Safety Reviewed, Fall Risk, Home Safety  [Encouraged Routine Use of Home Assistive Devices & Durable Medical Equipment.]  Home Safety Assistive Devices, Need for home safety assessment  [Encouraged Consideration of Home Safety Evaluation.]  Advanced Directive Interventions   Advanced Directives Discussed/Reviewed Advanced Directives Discussed, Advanced Directives Reviewed  [Confirmed Initiation of Advanced Directives (Living Will & Healthcare Power of Attorney Documents), Offering to Make Copies to Scan into Electronic Medical Record in Epic.]      Assessed Social Determinant of Health Barriers. Discussed  Plans for Ongoing Care Management Follow Up. Provided Careers information officer Information for Care Management Team Members. Screened for Signs & Symptoms of Depression, Related to Chronic Disease State.  PHQ2 & PHQ9 Depression Screen Completed & Results Reviewed.  Suicidal Ideation & Homicidal Ideation Assessed - None Present.   Domestic Violence Assessed - None Present. Access to Weapons Assessed - None Present.   Active Listening & Reflection Utilized.  Verbalization of Feelings Encouraged.  Emotional Support Provided. Feelings of Caregiver Burnout Validated. Caregiver Stress Acknowledged. Caregiver Resources Reviewed. Caregiver Support Groups Mailed. Self-Enrollment in Caregiver Support Group of Interest Emphasized. Crisis Support Information, Agencies, Services & Resources Discussed. Problem Solving Interventions Identified. Task-Centered Solutions Implemented.   Solution-Focused Strategies Developed. Acceptance & Commitment Therapy Introduced. Brief Cognitive Behavioral Therapy Initiated. Client-Centered Therapy Enacted. Reviewed Prescription Medications & Discussed Importance of Compliance. Quality of Sleep Assessed & Sleep Hygiene Techniques Promoted. CSW Collaboration with Daughter, Symone Cornman to Discuss Higher Level of Care Placement Options (I.e. Memory Care Assisted Living, Extended Care, Skilled Nursing, Etc.), & Encouraged Consideration. CSW Collaboration with Daughter, Cindra Austad to Verify No In-Home Care Services, [employer], Warden/ranger, Catering manager., Covered Under Firefighter through [employer].   CSW Collaboration with Daughter, Shondrika Hoque to Verify No Long-Term Care Insurance Benefits, Secondary Insurance Policies, Plans, Coverage, Etc.  CSW Collaboration with Daughter, Marionette Meskill to Confirm Neither Patient Nor Husband Were Veterans, Making Them Ineligible to Apply for Aid & Attendance Benefits, Through [employer]. CSW Collaboration with Daughter, Saudia Smyser to Offer Assistance with Completion of Medicaid Application & Submission to [facility] of Social Services [phone]) for Processing. CSW Collaboration with Daughter, Riva Sesma to Request Review of The Following List of [employer], [employer], Resources, Occupational psychologist, Mailed on 11/12/2023: ~ Medicaid Application ~ Adult Day Care Programs  ~ In-Home Care & Respite Agencies ~ Home Health Care Agencies ~ Respite Care Agencies & Facilities ~ Personal Care Services Application  ~ Development worker, international aid Agency Providers CSW Collaboration with Daughter, Kenyotta Dorfman to Encourage Routine Engagement with Danford Bad, Licensed Clinical Social Worker with [facility], Population Health [phone]), if She Has Questions, Needs Assistance, or If Additional Social Work Needs Are Identified Between Now & Our Next  Follow-Up Outreach Call, Scheduled on 11/19/2023 at 9:00 AM.        SDOH assessments and interventions completed:  Yes.  SDOH Interventions Today    Flowsheet Row Most Recent Value  SDOH Interventions   Food Insecurity Interventions Intervention Not Indicated  Housing Interventions Intervention Not Indicated  Transportation Interventions Intervention Not Indicated, Patient Resources (Friends/Family), Payor Benefit  Utilities Interventions Intervention Not Indicated  Alcohol Usage Interventions Intervention Not Indicated (Score <7)  Financial Strain Interventions Intervention Not Indicated  Physical Activity Interventions Intervention Not Indicated, Patient Declined  Stress Interventions Intervention Not Indicated  Social Connections Interventions Intervention Not Indicated  Health Literacy Interventions Intervention Not Indicated     Care Coordination Interventions:  Yes, provided.   Follow up plan: Follow up call scheduled for 11/19/2023 at 9:00  am.  Encounter Outcome:  Patient Visit Completed.    Danford Bad, BSW, MSW, LCSW [facility], [facility] Clinical Social Worker II Direct Dial: [phone]  Fax: [phone] Website: [email or web address]

## 2023-11-12 NOTE — Patient Instructions (Signed)
Visit Information  Thank you for taking time to visit with me today. Please don't hesitate to contact me if I can be of assistance to you.   Following are the goals we discussed today:   Goals Addressed             This Visit's Progress    Receive Assistance Pursuing Higher Level of Care Placement Options.   On track    Care Coordination Interventions:  Interventions Today    Flowsheet Row Most Recent Value  Chronic Disease   Chronic disease during today's visit Diabetes, Hypertension (HTN), Chronic Kidney Disease/End Stage Renal Disease (ESRD), Other  [Sleep Terror Disorder, Seizure-Like Activiy, Morbid Obesity, Daytime Somnolence, Sleep Apnea, Limited Mobility, Insomnia, Anxiety, Depression, Inability to Perform Activities of Daily Living Independently, Caregiver Burnout, Stress, & Fatigue.]  General Interventions   General Interventions Discussed/Reviewed General Interventions Discussed, Labs, Vaccines, Doctor Visits, Health Screening, Annual Foot Exam, General Interventions Reviewed, Lipid Profile, Durable Medical Equipment (DME), Annual Eye Exam, Community Resources, Level of Care, Communication with  [Encouraged Routine Engagement with Care Team Members & Providers.]  Labs Hgb A1c every 3 months, Kidney Function, Hgb A1c annually  [Encouraged Routine Lab Work.]  Vaccines COVID-19, Flu, Pneumonia, RSV, Shingles, Tetanus/Pertussis/Diphtheria  [Encouraged Routine Vaccinations.]  Doctor Visits Discussed/Reviewed Doctor Visits Discussed, Specialist, Doctor Visits Reviewed, Annual Wellness Visits, PCP  [Encouraged Routine Engagement with Care Team Members & Providers.]  Health Screening Bone Density, Colonoscopy, Mammogram  [Encouraged Routine Health Screenings.]  Durable Medical Equipment (DME) Bed side commode, BP Cuff, Glucomoter, Environmental consultant, Wheelchair, Other  [Scales, Medical laboratory scientific officer, Eyeglasses.]  Wheelchair Standard  PCP/Specialist Visits Compliance with follow-up visit  [Encouraged Routine  Engagement with Care Team Members & Providers.]  Communication with PCP/Specialists, Charity fundraiser, Pharmacists, Social Work  [employer] Routine Engagement with Care Team Members & Providers.]  Level of Care Adult Daycare, Assisted Living, Skilled Nursing Facility, Air traffic controller, Personal Care Services  [Confirmed Disinterest in Enrollment in Adult Day Care Program. Confirmed Interest in Pursuing Higher Level of Care Placement Options (I.e Memory Care Assisted Living Facility).]  Applications Medicaid, FL-2  [Confirmed Interest in Applying for Special Assistance Long-Term Care Medicaid & Having Primary Care Provider Complete FL-2 Form for Placement Purposes.]  Exercise Interventions   Exercise Discussed/Reviewed Exercise Discussed, Assistive device use and maintanence, Exercise Reviewed, Physical Activity, Weight Managment  [Encouraged Daily Exercise Regimen, as Tolerated.]  Physical Activity Discussed/Reviewed Physical Activity Discussed, Home Exercise Program (HEP), PREP, Gym, Physical Activity Reviewed, Types of exercise  [Encouraged Increased Level of Activity & Exercise, Inside & Outside the Home.]  Weight Management Weight loss  [Encouraged Healthy Weight Loss Regimen.]  Education Interventions   Education Provided Provided Therapist, sports, Provided Web-based Education, Provided Education  [Provided Educational Material & Thoroughly Reviewed to [employer] Understanding.]  Provided Verbal Education On Nutrition, Foot Care, Eye Care, Labs, Blood Sugar Monitoring, Mental Health/Coping with Illness, When to see the doctor, Applications, Exercise, Medication, Development worker, community, [employer] Resources  [Encouraged Continued Independent Review of Educational Material Provided.]  Labs Reviewed Hgb A1c  [Encouraged Routine Monitoring of Blood Sugars.]  Applications Medicaid, FL-2  [Confirmed Interest in Applying for Special Assistance Long-Term Care Medicaid & Having Primary Care Provider Complete FL-2 Form for Placement  Purposes.]  Mental Health Interventions   Mental Health Discussed/Reviewed Mental Health Discussed, Mental Health Reviewed, Coping Strategies, Crisis, Anxiety, Depression, Grief and Loss, Substance Abuse, Suicide, Other  [Assessed Mental Health & Cognitive Status. Offered Counseling & Supportive Services.]  Nutrition Interventions   Nutrition Discussed/Reviewed Supplemental nutrition, Decreasing  sugar intake, Carbohydrate meal planning, Portion sizes, Decreasing salt, Fluid intake, Nutrition Reviewed, Decreasing fats, Increasing proteins, Adding fruits and vegetables, Nutrition Discussed  [Encouraged Heart-Healthy, Diabetic Friendly, Low Sodium, Reduced Fat, High Fiber, Renal-Friendly, Reduced Sugar Diet.]  Pharmacy Interventions   Pharmacy Dicussed/Reviewed Pharmacy Topics Discussed, Medications and their functions, Medication Adherence, Pharmacy Topics Reviewed, Affording Medications  [Confirmed Ability to Afford Prescription Medications.]  Medication Adherence --  [Confirmed Compliance with Prescription Medications.]  Safety Interventions   Safety Discussed/Reviewed Safety Discussed, Safety Reviewed, Fall Risk, Home Safety  [Encouraged Routine Use of Home Assistive Devices & Durable Medical Equipment.]  Home Safety Assistive Devices, Need for home safety assessment  [Encouraged Consideration of Home Safety Evaluation.]  Advanced Directive Interventions   Advanced Directives Discussed/Reviewed Advanced Directives Discussed, Advanced Directives Reviewed  [Confirmed Initiation of Advanced Directives (Living Will & Healthcare Power of Attorney Documents), Offering to Make Copies to Scan into Electronic Medical Record in Epic.]      Assessed Social Determinant of Health Barriers. Discussed Plans for Ongoing Care Management Follow Up. Provided Careers information officer Information for Care Management Team Members. Screened for Signs & Symptoms of Depression, Related to Chronic Disease State.  PHQ2 & PHQ9  Depression Screen Completed & Results Reviewed.  Suicidal Ideation & Homicidal Ideation Assessed - None Present.   Domestic Violence Assessed - None Present. Access to Weapons Assessed - None Present.   Active Listening & Reflection Utilized.  Verbalization of Feelings Encouraged.  Emotional Support Provided. Feelings of Caregiver Burnout Validated. Caregiver Stress Acknowledged. Caregiver Resources Reviewed. Caregiver Support Groups Mailed. Self-Enrollment in Caregiver Support Group of Interest Emphasized. Crisis Support Information, Agencies, Services & Resources Discussed. Problem Solving Interventions Identified. Task-Centered Solutions Implemented.   Solution-Focused Strategies Developed. Acceptance & Commitment Therapy Introduced. Brief Cognitive Behavioral Therapy Initiated. Client-Centered Therapy Enacted. Reviewed Prescription Medications & Discussed Importance of Compliance. Quality of Sleep Assessed & Sleep Hygiene Techniques Promoted. CSW Collaboration with Daughter, Randi Poullard to Discuss Higher Level of Care Placement Options (I.e. Memory Care Assisted Living, Extended Care, Skilled Nursing, Etc.), & Encouraged Consideration. CSW Collaboration with Daughter, Tejah Brekke to Verify No In-Home Care Services, [employer], Warden/ranger, Catering manager., Covered Under Firefighter through [employer].   CSW Collaboration with Daughter, Shanequia Kendrick to Verify No Long-Term Care Insurance Benefits, Secondary Insurance Policies, Plans, Coverage, Etc.  CSW Collaboration with Daughter, Idalys Konecny to Confirm Neither Patient Nor Husband Were Veterans, Making Them Ineligible to Apply for Aid & Attendance Benefits, Through [employer]. CSW Collaboration with Daughter, Annalyssa Thune to Offer Assistance with Completion of Medicaid Application & Submission to [facility] of Social Services [phone]) for Processing. CSW  Collaboration with Daughter, Delbra Zellars to Request Review of The Following List of [employer], [employer], Resources, Occupational psychologist, Mailed on 11/12/2023: ~ Medicaid Application ~ Adult Day Care Programs  ~ In-Home Care & Respite Agencies ~ Home Health Care Agencies ~ Respite Care Agencies & Facilities ~ Personal Care Services Application  ~ Development worker, international aid Agency Providers CSW Collaboration with Daughter, Leyna Vanderkolk to Encourage Routine Engagement with Danford Bad, Licensed Clinical Social Worker with [facility], Population Health [phone]), if She Has Questions, Needs Assistance, or If Additional Social Work Needs Are Identified Between Now & Our Next Follow-Up Outreach Call, Scheduled on 11/19/2023 at 9:00 AM.      Our next appointment is by telephone on 11/19/2023 at 9:00 am.  Please call the care guide team at [phone]  if you need to cancel or reschedule your appointment.   If you are experiencing a Mental Health or Behavioral Health Crisis or need someone to talk to, please call the Suicide and Crisis Lifeline: 988 call the [place] National Suicide Prevention Lifeline: 870 084 9356 or TTY: (212)213-7906 TTY 580-628-8126) to talk to a trained counselor call 1-800-273-TALK (toll free, 24 hour hotline) go to [facility] [address] [place] [phone]) call the [facility] Crisis Line: [phone] call 911  Patient verbalizes understanding of instructions and care plan provided today and agrees to view in MyChart. Active MyChart status and patient understanding of how to access instructions and care plan via MyChart confirmed with patient.     Telephone follow up appointment with care management team member scheduled for:  11/19/2023 at 9:00 am.   Danford Bad, BSW, MSW, LCSW [place]  [facility] Clinical Social Worker II Direct  Dial: [phone]  Fax: [phone] Website: [email or web address]

## 2023-11-14 ENCOUNTER — Encounter: Payer: Self-pay | Admitting: *Deleted

## 2023-11-17 ENCOUNTER — Other Ambulatory Visit: Payer: Self-pay | Admitting: Family Medicine

## 2023-11-17 DIAGNOSIS — F419 Anxiety disorder, unspecified: Secondary | ICD-10-CM | POA: Insufficient documentation

## 2023-11-17 NOTE — Assessment & Plan Note (Addendum)
 Increased episodes of anxiety reported with increased behavioral challenge at those tomes, start klonopin 0.25 mg once daily as needed

## 2023-11-17 NOTE — Assessment & Plan Note (Signed)
 Submit CCUA and c/s if abn, will need to return with specimen

## 2023-11-17 NOTE — Assessment & Plan Note (Signed)
 Hyperlipidemia:Low fat diet discussed and encouraged.   Lipid Panel  Lab Results  Component Value Date   CHOL 153 10/24/2021   HDL 60 10/24/2021   LDLCALC 70 10/24/2021   TRIG 136 10/24/2021   CHOLHDL 2.6 10/24/2021     Updated lab needed at/ before next visit.

## 2023-11-17 NOTE — Assessment & Plan Note (Signed)
 Increased agitation and behavioral challenge , famoily no longer ablwe to care for pt at home, urgent referral for assistance with placement, not a new attempt but a more urgent need, as increased agitation and fall risk report, no full time caregiving available at ome even if pt was not a behavioral challenge

## 2023-11-17 NOTE — Assessment & Plan Note (Signed)
 DASH diet and commitment to daily physical activity for a minimum of 30 minutes discussed and encouraged, as a part of hypertension management. The importance of attaining a healthy weight is also discussed. Elevated at visit, no med change at this toime re eval in 7 weeks     11/04/2023    1:12 PM 10/29/2023    3:55 PM 10/29/2023    3:54 PM 09/20/2023    6:00 PM 09/20/2023   11:55 AM 09/20/2023   11:54 AM 08/01/2023    1:04 PM  BP/Weight  Systolic BP 134 163 155 169 173  136  Diastolic BP 58 79 74 80 51  78  Wt. (Lbs) 208  208.04   168 212.2  BMI 32.58 kg/m2  32.58 kg/m2   26.31 kg/m2 33.24 kg/m2

## 2023-11-17 NOTE — Progress Notes (Signed)
 Laura Atkins     MRN: [id number]      DOB: 1940-10-28  Chief Complaint  Patient presents with   Follow-up    Fl2 form, UTI seen by foot doctor last week and given antibiotics, dementia getting worse aggressive     HPI Laura Atkins is here for follow up and re-evaluation of chronic medical conditions, medication management and review of any available recent lab and radiology data.  Family, 1 daughter, present, granddaughter present , and daughter on telephone , all state that care at home is no longer possible because of increased agitation, lack of co operation and at times aggressive behavior Falls remain a constant fear as often Ms Atkins does not use her walker when she gets up on her own without someone nearby as has been encouraged all along Her grand daughter is also moving / relocating and will be several hours away, she has been one of the primary caregivers ROS Denies recent fever or chills. Denies sinus pressure, nasal congestion, ear pain or sore throat. Denies chest congestion, productive cough or wheezing. Denies chest pains, palpitations and leg swelling Denies abdominal pain, nausea, vomiting,diarrhea or constipation.   C/o burning with urination and malodorous urine x 5 days Denies skin break down or rash.   PE  BP (!) 163/79 (BP Location: Left Arm, Patient Position: Sitting, Cuff Size: Large)   Pulse 71   Ht 5' 7 (1.702 m)   Wt 208 lb 0.6 oz (94.4 kg)   SpO2 96%   BMI 32.58 kg/m   Patient alert  and in no pulmonary distress.  HEENT: No facial asymmetry, EOMI,     Neck decreased ROM  Chest: Clear to auscultation bilaterally.  CVS: S1, S2 no murmurs, no S3.Regular rate.  ABD: Soft non tender.   Ext: No edema  MS: markedly decreased  ROM spine, shoulders, hips and knees.  Skin: Intact, no ulcerations or rash noted.  Psych: Good eye contact, flat  affect. Memory loss  anxious not depressed appearing.  CNS: CN 2-12 intact, power,  normal throughout.no  focal deficits noted.   Assessment & Plan  Dementia (HCC) Increased agitation and behavioral challenge , famoily no longer ablwe to care for pt at home, urgent referral for assistance with placement, not a new attempt but a more urgent need, as increased agitation and fall risk report, no full time caregiving available at ome even if pt was not a behavioral challenge  UTI (urinary tract infection) Submit CCUA and c/s if abn, will need to return with specimen  Type 2 diabetes mellitus with neurological complications (HCC) Diabetes associated with hypertension, hyperlipidemia, obesity, arthritis, and depression  Laura Atkins is reminded of the importance of commitment to daily physical activity for 30 minutes or more, as able and the need to limit carbohydrate intake to 30 to 60 grams per meal to help with blood sugar control.  Controlled, managed by Endo  The need to take medication as prescribed, test blood sugar as directed, and to call between visits if there is a concern that blood sugar is uncontrolled is also discussed.   Laura Atkins is reminded of the importance of daily foot exam, annual eye examination, and good blood sugar, blood pressure and cholesterol control.     Latest Ref Rng & Units 11/04/2023    1:28 PM 09/20/2023   12:24 PM 06/22/2023    4:31 AM 06/21/2023    3:26 PM 06/20/2023    4:43 AM  Diabetic Labs  HbA1c 4.0 - 5.6 % 6.1       Creatinine 0.44 - 1.00 mg/dL  8.86  8.48  8.36  7.98       11/04/2023    1:12 PM 10/29/2023    3:55 PM 10/29/2023    3:54 PM 09/20/2023    6:00 PM 09/20/2023   11:55 AM 09/20/2023   11:54 AM 08/01/2023    1:04 PM  BP/Weight  Systolic BP 134 163 155 169 173  136  Diastolic BP 58 79 74 80 51  78  Wt. (Lbs) 208  208.04   168 212.2  BMI 32.58 kg/m2  32.58 kg/m2   26.31 kg/m2 33.24 kg/m2      Latest Ref Rng & Units 12/01/2021    9:40 AM 02/15/2021   12:00 AM  Foot/eye exam completion dates  Eye Exam No Retinopathy  Retinopathy      Foot  Form Completion  Done      This result is from an external source.        UNSTEADY GAIT Home safety, fall reduction and use of walker with assisted ambulation discussed and encouraged   Hyperlipidemia associated with type 2 diabetes mellitus (HCC) Hyperlipidemia:Low fat diet discussed and encouraged.   Lipid Panel  Lab Results  Component Value Date   CHOL 153 10/24/2021   HDL 60 10/24/2021   LDLCALC 70 10/24/2021   TRIG 136 10/24/2021   CHOLHDL 2.6 10/24/2021     Updated lab needed at/ before next visit.   Essential hypertension DASH diet and commitment to daily physical activity for a minimum of 30 minutes discussed and encouraged, as a part of hypertension management. The importance of attaining a healthy weight is also discussed. Elevated at visit, no med change at this toime re eval in 7 weeks     11/04/2023    1:12 PM 10/29/2023    3:55 PM 10/29/2023    3:54 PM 09/20/2023    6:00 PM 09/20/2023   11:55 AM 09/20/2023   11:54 AM 08/01/2023    1:04 PM  BP/Weight  Systolic BP 134 163 155 169 173  136  Diastolic BP 58 79 74 80 51  78  Wt. (Lbs) 208  208.04   168 212.2  BMI 32.58 kg/m2  32.58 kg/m2   26.31 kg/m2 33.24 kg/m2       Anxiety Increased episodes of anxiety reported with increased behavioral challenge at those tomes, start klonopin  0.25 mg once daily as needed

## 2023-11-17 NOTE — Assessment & Plan Note (Signed)
 Home safety, fall reduction and use of walker with assisted ambulation discussed and encouraged

## 2023-11-17 NOTE — Assessment & Plan Note (Signed)
 Diabetes associated with hypertension, hyperlipidemia, obesity, arthritis, and depression  Laura Atkins is reminded of the importance of commitment to daily physical activity for 30 minutes or more, as able and the need to limit carbohydrate intake to 30 to 60 grams per meal to help with blood sugar control.  Controlled, managed by Endo  The need to take medication as prescribed, test blood sugar as directed, and to call between visits if there is a concern that blood sugar is uncontrolled is also discussed.   Laura Atkins is reminded of the importance of daily foot exam, annual eye examination, and good blood sugar, blood pressure and cholesterol control.     Latest Ref Rng & Units 11/04/2023    1:28 PM 09/20/2023   12:24 PM 06/22/2023    4:31 AM 06/21/2023    3:26 PM 06/20/2023    4:43 AM  Diabetic Labs  HbA1c 4.0 - 5.6 % 6.1       Creatinine 0.44 - 1.00 mg/dL  1.61  0.96  0.45  4.09       11/04/2023    1:12 PM 10/29/2023    3:55 PM 10/29/2023    3:54 PM 09/20/2023    6:00 PM 09/20/2023   11:55 AM 09/20/2023   11:54 AM 08/01/2023    1:04 PM  BP/Weight  Systolic BP 134 163 155 169 173  136  Diastolic BP 58 79 74 80 51  78  Wt. (Lbs) 208  208.04   168 212.2  BMI 32.58 kg/m2  32.58 kg/m2   26.31 kg/m2 33.24 kg/m2      Latest Ref Rng & Units 12/01/2021    9:40 AM 02/15/2021   12:00 AM  Foot/eye exam completion dates  Eye Exam No Retinopathy  Retinopathy      Foot Form Completion  Done      This result is from an external source.

## 2023-11-19 ENCOUNTER — Ambulatory Visit: Payer: Self-pay | Admitting: *Deleted

## 2023-11-19 NOTE — Patient Instructions (Signed)
 Visit Information  Thank you for taking time to visit with me today. Please don't hesitate to contact me if I can be of assistance to you.   Following are the goals we discussed today:   Goals Addressed             This Visit's Progress    Receive Assistance Pursuing Higher Level of Care Placement Options.   On track    Care Coordination Interventions:  Interventions Today    Flowsheet Row Most Recent Value  Chronic Disease   Chronic disease during today's visit Diabetes, Hypertension (HTN), Chronic Kidney Disease/End Stage Renal Disease (ESRD), Other  [Sleep Terror Disorder, Seizure-Like Activiy, Morbid Obesity, Daytime Somnolence, Sleep Apnea, Limited Mobility, Insomnia, Anxiety, Depression, Inability to Perform Activities of Daily Living Independently, Caregiver Burnout, Stress, & Fatigue.]  General Interventions   General Interventions Discussed/Reviewed General Interventions Discussed, Labs, Vaccines, Doctor Visits, Health Screening, Annual Foot Exam, General Interventions Reviewed, Lipid Profile, Durable Medical Equipment (DME), Annual Eye Exam, Community Resources, Level of Care, Communication with  [Encouraged Routine Engagement with Care Team Members & Providers.]  Labs Hgb A1c every 3 months, Kidney Function, Hgb A1c annually  [Encouraged Routine Lab Work.]  Vaccines COVID-19, Flu, Pneumonia, RSV, Shingles, Tetanus/Pertussis/Diphtheria  [Encouraged Routine Vaccinations.]  Doctor Visits Discussed/Reviewed Doctor Visits Discussed, Specialist, Doctor Visits Reviewed, Annual Wellness Visits, PCP  [Encouraged Routine Engagement with Care Team Members & Providers.]  Health Screening Bone Density, Colonoscopy, Mammogram  [Encouraged Routine Health Screenings.]  Durable Medical Equipment (DME) Bed side commode, BP Cuff, Glucomoter, Environmental consultant, Wheelchair, Other  [Scales, Medical laboratory scientific officer, Eyeglasses.]  Wheelchair Standard  PCP/Specialist Visits Compliance with follow-up visit  [Encouraged Routine  Engagement with Care Team Members & Providers.]  Communication with PCP/Specialists, Charity fundraiser, Pharmacists, Social Work  [employer] Routine Engagement with Care Team Members & Providers.]  Level of Care Adult Daycare, Assisted Living, Skilled Nursing Facility, Air traffic controller, Personal Care Services  [Confirmed Disinterest in Enrollment in Adult Day Care Program. Confirmed Interest in Pursuing Higher Level of Care Placement Options (I.e Memory Care Assisted Living Facility).]  Applications Medicaid, FL-2  [Confirmed Interest in Applying for Special Assistance Long-Term Care Medicaid & Having Primary Care Provider Complete FL-2 Form for Placement Purposes.]  Exercise Interventions   Exercise Discussed/Reviewed Exercise Discussed, Assistive device use and maintanence, Exercise Reviewed, Physical Activity, Weight Managment  [Encouraged Daily Exercise Regimen, as Tolerated.]  Physical Activity Discussed/Reviewed Physical Activity Discussed, Home Exercise Program (HEP), PREP, Gym, Physical Activity Reviewed, Types of exercise  [Encouraged Increased Level of Activity & Exercise, Inside & Outside the Home.]  Weight Management Weight loss  [Encouraged Healthy Weight Loss Regimen.]  Education Interventions   Education Provided Provided Therapist, sports, Provided Web-based Education, Provided Education  [Provided Educational Material & Thoroughly Reviewed to [employer] Understanding.]  Provided Verbal Education On Nutrition, Foot Care, Eye Care, Labs, Blood Sugar Monitoring, Mental Health/Coping with Illness, When to see the doctor, Applications, Exercise, Medication, Development worker, community, [employer] Resources  [Encouraged Continued Independent Review of Educational Material Provided.]  Labs Reviewed Hgb A1c  [Encouraged Routine Monitoring of Blood Sugars.]  Applications Medicaid, FL-2  [Confirmed Interest in Applying for Special Assistance Long-Term Care Medicaid & Having Primary Care Provider Complete FL-2 Form for Placement  Purposes.]  Mental Health Interventions   Mental Health Discussed/Reviewed Mental Health Discussed, Mental Health Reviewed, Coping Strategies, Crisis, Anxiety, Depression, Grief and Loss, Substance Abuse, Suicide, Other  [Assessed Mental Health & Cognitive Status. Offered Counseling & Supportive Services.]  Nutrition Interventions   Nutrition Discussed/Reviewed Supplemental nutrition, Decreasing  sugar intake, Carbohydrate meal planning, Portion sizes, Decreasing salt, Fluid intake, Nutrition Reviewed, Decreasing fats, Increasing proteins, Adding fruits and vegetables, Nutrition Discussed  [Encouraged Heart-Healthy, Diabetic Friendly, Low Sodium, Reduced Fat, High Fiber, Renal-Friendly, Reduced Sugar Diet.]  Pharmacy Interventions   Pharmacy Dicussed/Reviewed Pharmacy Topics Discussed, Medications and their functions, Medication Adherence, Pharmacy Topics Reviewed, Affording Medications  [Confirmed Ability to Afford Prescription Medications.]  Medication Adherence --  [Confirmed Compliance with Prescription Medications.]  Safety Interventions   Safety Discussed/Reviewed Safety Discussed, Safety Reviewed, Fall Risk, Home Safety  [Encouraged Routine Use of Home Assistive Devices & Durable Medical Equipment.]  Home Safety Assistive Devices, Need for home safety assessment  [Encouraged Consideration of Home Safety Evaluation.]  Advanced Directive Interventions   Advanced Directives Discussed/Reviewed Advanced Directives Discussed, Advanced Directives Reviewed  [Confirmed Initiation of Advanced Directives (Living Will & Healthcare Power of Attorney Documents), Offering to Make Copies to Scan into Electronic Medical Record in Epic.]      Active Listening & Reflection Utilized.  Verbalization of Feelings Encouraged.  Emotional Support Provided. Feelings of Caregiver Burnout, Stress, & Fatigue Acknowledged. Self-Enrollment in Caregiver Support Group of Interest Emphasized, from List Provided. Problem  Solving Interventions Employed. Task-Centered Solutions Activated.   Solution-Focused Strategies Implemented. Acceptance & Commitment Therapy Initiated. Cognitive Behavioral Therapy Indicated. Client-Centered Therapy Performed. CSW Collaboration with Daughter, Chelsia Serres to Confirm Interest in Pursuing Higher Level of Care Placement Options (I.e. Memory Care Assisted Living, Extended Care, Skilled Nursing, Etc.), Mailing List of Facilities in [place]. CSW Collaboration with Daughter, Marney Treloar to Offer Assistance with Completion of Special Assistance Long-Term Care Medicaid Application & Submission to [facility] of Social Services [phone]) for Processing. CSW Collaboration with Daughter, Saloni Lablanc to Confirm Patient is No Longer Requiring Insulin, As A1c is Controlled for First Time in 30 Years.  CSW Collaboration with Daughter, Keir Foland to [employer] Receipt & Thoroughly Review the Following List of [employer], [employer], Resources, Occupational psychologist, to [employer] & [employer] Questions: ~ Medicaid Application ~ Adult Day Care Programs  ~ In-Home Care & Respite Agencies ~ Home Health Care Agencies ~ Respite Care Agencies & Facilities ~ Personal Care Services Application  ~ Theatre manager Providers CSW Collaboration with Daughter, Skylynn Burkley to Encourage Routine Engagement with Danford Bad, Licensed Clinical Social Worker with [facility], Population Health [phone]), if She Has Questions, Needs Assistance, or If Additional Social Work Needs Are Identified Between Now & Our Next Follow-Up Outreach Call, Scheduled on 12/09/2023 at 1:45 PM.      Our next appointment is by telephone on 12/09/2023 at 1:45 pm.  Please call the care guide team at [phone] if you need to cancel or reschedule your appointment.   If you are experiencing a Mental Health or Behavioral Health Crisis  or need someone to talk to, please call the Suicide and Crisis Lifeline: 988 call the [place] National Suicide Prevention Lifeline: (913) 042-0994 or TTY: 573-690-6793 TTY (425)875-4903) to talk to a trained counselor call 1-800-273-TALK (toll free, 24 hour hotline) go to [facility] [address], [place] [phone]) call the [facility] Crisis Line: [phone] call 911  Patient verbalizes understanding of instructions and care plan provided today and agrees to view in MyChart. Active MyChart status and patient understanding of how to access instructions and care plan via MyChart confirmed with patient.     Telephone follow up appointment with care management team member scheduled for:   12/09/2023  at 1:45 pm.  Danford Bad, BSW, MSW, LCSW [facility], [facility] Clinical Social Worker II Direct Dial: [phone]  Fax: [phone] Website: [email or web address]

## 2023-11-19 NOTE — Patient Outreach (Signed)
 Care Coordination   Follow Up Visit Note   11/19/2023  Name: Laura Atkins MRN: [id number] DOB: 1941/06/23  Laura Atkins is a 83 y.o. year old female who sees Kerri Perches, MD for primary care. I spoke with patient's daughter, Laura Atkins by phone today.  What matters to the patients health and wellness today?  Receive Assistance Pursuing Higher Level of Care Placement Options.    Goals Addressed             This Visit's Progress    Receive Assistance Pursuing Higher Level of Care Placement Options.   On track    Care Coordination Interventions:  Interventions Today    Flowsheet Row Most Recent Value  Chronic Disease   Chronic disease during today's visit Diabetes, Hypertension (HTN), Chronic Kidney Disease/End Stage Renal Disease (ESRD), Other  [Sleep Terror Disorder, Seizure-Like Activiy, Morbid Obesity, Daytime Somnolence, Sleep Apnea, Limited Mobility, Insomnia, Anxiety, Depression, Inability to Perform Activities of Daily Living Independently, Caregiver Burnout, Stress, & Fatigue.]  General Interventions   General Interventions Discussed/Reviewed General Interventions Discussed, Labs, Vaccines, Doctor Visits, Health Screening, Annual Foot Exam, General Interventions Reviewed, Lipid Profile, Durable Medical Equipment (DME), Annual Eye Exam, Community Resources, Level of Care, Communication with  [Encouraged Routine Engagement with Care Team Members & Providers.]  Labs Hgb A1c every 3 months, Kidney Function, Hgb A1c annually  [Encouraged Routine Lab Work.]  Vaccines COVID-19, Flu, Pneumonia, RSV, Shingles, Tetanus/Pertussis/Diphtheria  [Encouraged Routine Vaccinations.]  Doctor Visits Discussed/Reviewed Doctor Visits Discussed, Specialist, Doctor Visits Reviewed, Annual Wellness Visits, PCP  [Encouraged Routine Engagement with Care Team Members & Providers.]  Health Screening Bone Density, Colonoscopy, Mammogram  [Encouraged Routine Health Screenings.]  Durable Medical  Equipment (DME) Bed side commode, BP Cuff, Glucomoter, Environmental consultant, Wheelchair, Other  [Scales, Medical laboratory scientific officer, Eyeglasses.]  Wheelchair Standard  PCP/Specialist Visits Compliance with follow-up visit  [Encouraged Routine Engagement with Care Team Members & Providers.]  Communication with PCP/Specialists, Charity fundraiser, Pharmacists, Social Work  [employer] Routine Engagement with Care Team Members & Providers.]  Level of Care Adult Daycare, Assisted Living, Skilled Nursing Facility, Air traffic controller, Personal Care Services  [Confirmed Disinterest in Enrollment in Adult Day Care Program. Confirmed Interest in Pursuing Higher Level of Care Placement Options (I.e Memory Care Assisted Living Facility).]  Applications Medicaid, FL-2  [Confirmed Interest in Applying for Special Assistance Long-Term Care Medicaid & Having Primary Care Provider Complete FL-2 Form for Placement Purposes.]  Exercise Interventions   Exercise Discussed/Reviewed Exercise Discussed, Assistive device use and maintanence, Exercise Reviewed, Physical Activity, Weight Managment  [Encouraged Daily Exercise Regimen, as Tolerated.]  Physical Activity Discussed/Reviewed Physical Activity Discussed, Home Exercise Program (HEP), PREP, Gym, Physical Activity Reviewed, Types of exercise  [Encouraged Increased Level of Activity & Exercise, Inside & Outside the Home.]  Weight Management Weight loss  [Encouraged Healthy Weight Loss Regimen.]  Education Interventions   Education Provided Provided Therapist, sports, Provided Web-based Education, Provided Education  [Provided Educational Material & Thoroughly Reviewed to [employer] Understanding.]  Provided Verbal Education On Nutrition, Foot Care, Eye Care, Labs, Blood Sugar Monitoring, Mental Health/Coping with Illness, When to see the doctor, Applications, Exercise, Medication, Development worker, community, [employer] Resources  [Encouraged Continued Independent Review of Educational Material Provided.]  Labs Reviewed Hgb A1c  [Encouraged  Routine Monitoring of Blood Sugars.]  Applications Medicaid, FL-2  [Confirmed Interest in Applying for Special Assistance Long-Term Care Medicaid & Having Primary Care Provider Complete FL-2 Form for Placement Purposes.]  Mental Health Interventions   Mental Health Discussed/Reviewed Mental Health Discussed, Mental Health  Reviewed, Coping Strategies, Crisis, Anxiety, Depression, Grief and Loss, Substance Abuse, Suicide, Other  [Assessed Mental Health & Cognitive Status. Offered Counseling & Supportive Services.]  Nutrition Interventions   Nutrition Discussed/Reviewed Supplemental nutrition, Decreasing sugar intake, Carbohydrate meal planning, Portion sizes, Decreasing salt, Fluid intake, Nutrition Reviewed, Decreasing fats, Increasing proteins, Adding fruits and vegetables, Nutrition Discussed  [Encouraged Heart-Healthy, Diabetic Friendly, Low Sodium, Reduced Fat, High Fiber, Renal-Friendly, Reduced Sugar Diet.]  Pharmacy Interventions   Pharmacy Dicussed/Reviewed Pharmacy Topics Discussed, Medications and their functions, Medication Adherence, Pharmacy Topics Reviewed, Affording Medications  [Confirmed Ability to Afford Prescription Medications.]  Medication Adherence --  [Confirmed Compliance with Prescription Medications.]  Safety Interventions   Safety Discussed/Reviewed Safety Discussed, Safety Reviewed, Fall Risk, Home Safety  [Encouraged Routine Use of Home Assistive Devices & Durable Medical Equipment.]  Home Safety Assistive Devices, Need for home safety assessment  [Encouraged Consideration of Home Safety Evaluation.]  Advanced Directive Interventions   Advanced Directives Discussed/Reviewed Advanced Directives Discussed, Advanced Directives Reviewed  [Confirmed Initiation of Advanced Directives (Living Will & Healthcare Power of Attorney Documents), Offering to Make Copies to Scan into Electronic Medical Record in Epic.]      Active Listening & Reflection Utilized.  Verbalization of  Feelings Encouraged.  Emotional Support Provided. Feelings of Caregiver Burnout, Stress, & Fatigue Acknowledged. Self-Enrollment in Caregiver Support Group of Interest Emphasized, from List Provided. Problem Solving Interventions Employed. Task-Centered Solutions Activated.   Solution-Focused Strategies Implemented. Acceptance & Commitment Therapy Initiated. Cognitive Behavioral Therapy Indicated. Client-Centered Therapy Performed. CSW Collaboration with Daughter, Laura Atkins to Confirm Interest in Pursuing Higher Level of Care Placement Options (I.e. Memory Care Assisted Living, Extended Care, Skilled Nursing, Etc.), Mailing List of Facilities in [place]. CSW Collaboration with Daughter, Laura Atkins to Offer Assistance with Completion of Special Assistance Long-Term Care Medicaid Application & Submission to [facility] of Social Services [phone]) for Processing. CSW Collaboration with Daughter, Laura Atkins to Confirm Patient is No Longer Requiring Insulin, As A1c is Controlled for First Time in 30 Years.  CSW Collaboration with Daughter, Laura Atkins to [employer] Receipt & Thoroughly Review the Following List of [employer], [employer], Resources, Occupational psychologist, to [employer] & [employer] Questions: ~ Medicaid Application ~ Adult Day Care Programs  ~ In-Home Care & Respite Agencies ~ Home Health Care Agencies ~ Respite Care Agencies & Facilities ~ Personal Care Services Application  ~ Theatre manager Providers CSW Collaboration with Daughter, Laura Atkins to Encourage Routine Engagement with Danford Bad, Licensed Clinical Social Worker with [facility], Population Health [phone]), if She Has Questions, Needs Assistance, or If Additional Social Work Needs Are Identified Between Now & Our Next Follow-Up Outreach Call, Scheduled on 12/09/2023 at 1:45 PM.      SDOH assessments and  interventions completed:  Yes.  Care Coordination Interventions:  Yes, provided.   Follow up plan: Follow up call scheduled for 12/09/2023 at 1:45 pm.  Encounter Outcome:  Patient Visit Completed.    Danford Bad, BSW, MSW, LCSW [facility], [facility] Clinical Social Worker II Direct Dial: [phone]  Fax: [phone] Website: [email or web address]

## 2023-12-03 ENCOUNTER — Ambulatory Visit: Payer: Self-pay | Admitting: Family Medicine

## 2023-12-03 NOTE — Telephone Encounter (Addendum)
 Copied from CRM [id number]. Topic: Clinical - Medical Advice >> Dec 03, 2023  4:07 PM Turkey B wrote: Reason for CRM: pt called in has swelling in legs    This RN called the phone number on file, and patient's daughter Jasmine December answered the phone. She stated that patient does not have any new problems that are needing attention at this time. She stated patient has been experiencing swelling in her legs for 20 years, and she has a foot doctor that she follows up with. Jasmine December stated that patient gets confused and forgets that her legs have been swollen therefore patient must have called the office by mistake with the help of her son.  No further action is needed.

## 2023-12-09 ENCOUNTER — Ambulatory Visit: Payer: Self-pay | Admitting: *Deleted

## 2023-12-09 NOTE — Patient Instructions (Signed)
 Visit Information  Thank you for taking time to visit with me today. Please don't hesitate to contact me if I can be of assistance to you.   Following are the goals we discussed today:   Goals Addressed             This Visit's Progress    COMPLETED: Receive Assistance Pursuing Higher Level of Care Placement Options.   On track    Care Coordination Interventions:  Interventions Today    Flowsheet Row Most Recent Value  Chronic Disease   Chronic disease during today's visit Diabetes, Hypertension (HTN), Chronic Kidney Disease/End Stage Renal Disease (ESRD), Other  [Sleep Terror Disorder, Seizure-Like Activiy, Morbid Obesity, Daytime Somnolence, Sleep Apnea, Limited Mobility, Insomnia, Anxiety, Depression, Inability to Perform Activities of Daily Living Independently, Caregiver Burnout, Stress, & Fatigue.]  General Interventions   General Interventions Discussed/Reviewed General Interventions Discussed, Labs, Vaccines, Doctor Visits, Health Screening, Annual Foot Exam, General Interventions Reviewed, Lipid Profile, Durable Medical Equipment (DME), Annual Eye Exam, Community Resources, Level of Care, Communication with  [Encouraged Routine Engagement with Care Team Members & Providers.]  Labs Hgb A1c every 3 months, Kidney Function, Hgb A1c annually  [Encouraged Routine Lab Work.]  Vaccines COVID-19, Flu, Pneumonia, RSV, Shingles, Tetanus/Pertussis/Diphtheria  [Encouraged Routine Vaccinations.]  Doctor Visits Discussed/Reviewed Doctor Visits Discussed, Specialist, Doctor Visits Reviewed, Annual Wellness Visits, PCP  [Encouraged Routine Engagement with Care Team Members & Providers.]  Health Screening Bone Density, Colonoscopy, Mammogram  [Encouraged Routine Health Screenings.]  Durable Medical Equipment (DME) Bed side commode, BP Cuff, Glucomoter, Environmental consultant, Wheelchair, Other  [Scales, Medical laboratory scientific officer, Eyeglasses.]  Wheelchair Standard  PCP/Specialist Visits Compliance with follow-up visit  [Encouraged  Routine Engagement with Care Team Members & Providers.]  Communication with PCP/Specialists, Charity fundraiser, Pharmacists, Social Work  [employer] Routine Engagement with Care Team Members & Providers.]  Level of Care Adult Daycare, Assisted Living, Skilled Nursing Facility, Air traffic controller, Personal Care Services  [Confirmed Disinterest in Enrollment in Adult Day Care Program. Confirmed Interest in Pursuing Higher Level of Care Placement Options (I.e Memory Care Assisted Living Facility).]  Applications Medicaid, [place]-2  [Confirmed Interest in Applying for Special Assistance Long-Term Care Medicaid & Having Primary Care Provider Complete FL-2 Form for Placement Purposes.]  Exercise Interventions   Exercise Discussed/Reviewed Exercise Discussed, Assistive device use and maintanence, Exercise Reviewed, Physical Activity, Weight Managment  [Encouraged Daily Exercise Regimen, as Tolerated.]  Physical Activity Discussed/Reviewed Physical Activity Discussed, Home Exercise Program (HEP), PREP, Gym, Physical Activity Reviewed, Types of exercise  [Encouraged Increased Level of Activity & Exercise, Inside & Outside the Home.]  Weight Management Weight loss  [Encouraged Healthy Weight Loss Regimen.]  Education Interventions   Education Provided Provided Therapist, sports, Provided Web-based Education, Provided Education  [Provided Educational Material & Thoroughly Reviewed to [employer] Understanding.]  Provided Verbal Education On Nutrition, Foot Care, Eye Care, Labs, Blood Sugar Monitoring, Mental Health/Coping with Illness, When to see the doctor, Applications, Exercise, Medication, Development worker, community, [employer]  [Encouraged Continued Independent Review of Educational Material Provided.]  Labs Reviewed Hgb A1c  [Encouraged Routine Monitoring of Blood Sugars.]  Applications Medicaid, [place]-2  [Confirmed Interest in Applying for Special Assistance Long-Term Care Medicaid & Having Primary Care Provider Complete [place]-2 Form for  Placement Purposes.]  Mental Health Interventions   Mental Health Discussed/Reviewed Mental Health Discussed, Mental Health Reviewed, Coping Strategies, Crisis, Anxiety, Depression, Grief and Loss, Substance Abuse, Suicide, Other  [Assessed Mental Health & Cognitive Status. Offered Counseling & Supportive Services.]  Nutrition Interventions   Nutrition Discussed/Reviewed Supplemental nutrition,  Decreasing sugar intake, Carbohydrate meal planning, Portion sizes, Decreasing salt, Fluid intake, Nutrition Reviewed, Decreasing fats, Increasing proteins, Adding fruits and vegetables, Nutrition Discussed  [Encouraged Heart-Healthy, Diabetic Friendly, Low Sodium, Reduced Fat, High Fiber, Renal-Friendly, Reduced Sugar Diet.]  Pharmacy Interventions   Pharmacy Dicussed/Reviewed Pharmacy Topics Discussed, Medications and their functions, Medication Adherence, Pharmacy Topics Reviewed, Affording Medications  [Confirmed Ability to Afford Prescription Medications.]  Medication Adherence --  [Confirmed Compliance with Prescription Medications.]  Safety Interventions   Safety Discussed/Reviewed Safety Discussed, Safety Reviewed, Fall Risk, Home Safety  [Encouraged Routine Use of Home Assistive Devices & Durable Medical Equipment.]  Home Safety Assistive Devices, Need for home safety assessment  [Encouraged Consideration of Home Safety Evaluation.]  Advanced Directive Interventions   Advanced Directives Discussed/Reviewed Advanced Directives Discussed, Advanced Directives Reviewed  [Confirmed Initiation of Advanced Directives (Living Will & Healthcare Power of Attorney Documents), Offering to Make Copies to Scan into Electronic Medical Record in Epic.]      Active Listening & Reflection Utilized.  Verbalization of Feelings Encouraged.  Emotional Support Provided. Acceptance & Commitment Therapy Initiated. Cognitive Behavioral Therapy Indicated. Client-Centered Therapy Completed. CSW Collaboration with Daughter,  Laura Atkins to Encourage Engagement with Laura Atkins, Licensed Clinical Social Worker with [facility], [facility] [phone]), if She Has Questions, Needs Assistance, Additional Social Work Needs Are Identified in The Near Future, or If You Change Your Mind About Wanting to Receive Social Work Services.      Please call the care guide team at [phone] if you need to cancel or reschedule your appointment.   If you are experiencing a Mental Health or Behavioral Health Crisis or need someone to talk to, please call the Suicide and Crisis Lifeline: 988 call the [place] National Suicide Prevention Lifeline: (737)780-0790 or TTY: 770-381-6591 TTY 860-661-7316) to talk to a trained counselor call 1-800-273-TALK (toll free, 24 hour hotline) go to [facility] [address], [place] [phone]) call the [facility] Crisis Line: [phone] call 911  Patient verbalizes understanding of instructions and care plan provided today and agrees to view in MyChart. Active MyChart status and patient understanding of how to access instructions and care plan via MyChart confirmed with patient.     No further follow up required.  Laura Atkins, BSW, MSW, LCSW [facility], [facility] Clinical Social Worker II Direct Dial: [phone]  Fax: [phone] Website: [email or web address]

## 2023-12-09 NOTE — Patient Outreach (Signed)
 Care Coordination   Follow Up Visit Note   12/09/2023  Name: Laura Atkins MRN: [id number] DOB: 1941/09/09  Laura Atkins is a 83 y.o. year old female who sees Laura Perches, MD for primary care. I spoke with patient's daughter, Laura Atkins by phone today.  What matters to the patients health and wellness today?  Receive Assistance Pursuing Higher Level of Care Placement Options.    Goals Addressed             This Visit's Progress    COMPLETED: Receive Assistance Pursuing Higher Level of Care Placement Options.   On track    Care Coordination Interventions:  Interventions Today    Flowsheet Row Most Recent Value  Chronic Disease   Chronic disease during today's visit Diabetes, Hypertension (HTN), Chronic Kidney Disease/End Stage Renal Disease (ESRD), Other  [Sleep Terror Disorder, Seizure-Like Activiy, Morbid Obesity, Daytime Somnolence, Sleep Apnea, Limited Mobility, Insomnia, Anxiety, Depression, Inability to Perform Activities of Daily Living Independently, Caregiver Burnout, Stress, & Fatigue.]  General Interventions   General Interventions Discussed/Reviewed General Interventions Discussed, Labs, Vaccines, Doctor Visits, Health Screening, Annual Foot Exam, General Interventions Reviewed, Lipid Profile, Durable Medical Equipment (DME), Annual Eye Exam, Community Resources, Level of Care, Communication with  [Encouraged Routine Engagement with Care Team Members & Providers.]  Labs Hgb A1c every 3 months, Kidney Function, Hgb A1c annually  [Encouraged Routine Lab Work.]  Vaccines COVID-19, Flu, Pneumonia, RSV, Shingles, Tetanus/Pertussis/Diphtheria  [Encouraged Routine Vaccinations.]  Doctor Visits Discussed/Reviewed Doctor Visits Discussed, Specialist, Doctor Visits Reviewed, Annual Wellness Visits, PCP  [Encouraged Routine Engagement with Care Team Members & Providers.]  Health Screening Bone Density, Colonoscopy, Mammogram  [Encouraged Routine Health Screenings.]   Durable Medical Equipment (DME) Bed side commode, BP Cuff, Glucomoter, Environmental consultant, Wheelchair, Other  [Scales, Medical laboratory scientific officer, Eyeglasses.]  Wheelchair Standard  PCP/Specialist Visits Compliance with follow-up visit  [Encouraged Routine Engagement with Care Team Members & Providers.]  Communication with PCP/Specialists, Charity fundraiser, Pharmacists, Social Work  [employer] Routine Engagement with Care Team Members & Providers.]  Level of Care Adult Daycare, Assisted Living, Skilled Nursing Facility, Air traffic controller, Personal Care Services  [Confirmed Disinterest in Enrollment in Adult Day Care Program. Confirmed Interest in Pursuing Higher Level of Care Placement Options (I.e Memory Care Assisted Living Facility).]  Applications Medicaid, [place]-2  [Confirmed Interest in Applying for Special Assistance Long-Term Care Medicaid & Having Primary Care Provider Complete FL-2 Form for Placement Purposes.]  Exercise Interventions   Exercise Discussed/Reviewed Exercise Discussed, Assistive device use and maintanence, Exercise Reviewed, Physical Activity, Weight Managment  [Encouraged Daily Exercise Regimen, as Tolerated.]  Physical Activity Discussed/Reviewed Physical Activity Discussed, Home Exercise Program (HEP), PREP, Gym, Physical Activity Reviewed, Types of exercise  [Encouraged Increased Level of Activity & Exercise, Inside & Outside the Home.]  Weight Management Weight loss  [Encouraged Healthy Weight Loss Regimen.]  Education Interventions   Education Provided Provided Therapist, sports, Provided Web-based Education, Provided Education  [Provided Educational Material & Thoroughly Reviewed to [employer] Understanding.]  Provided Verbal Education On Nutrition, Foot Care, Eye Care, Labs, Blood Sugar Monitoring, Mental Health/Coping with Illness, When to see the doctor, Applications, Exercise, Medication, Development worker, community, [employer]  [Encouraged Continued Independent Review of Educational Material Provided.]  Labs Reviewed Hgb A1c   [Encouraged Routine Monitoring of Blood Sugars.]  Applications Medicaid, FL-2  [Confirmed Interest in Applying for Special Assistance Long-Term Care Medicaid & Having Primary Care Provider Complete FL-2 Form for Placement Purposes.]  Mental Health Interventions   Mental Health Discussed/Reviewed Mental Health Discussed, Mental  Health Reviewed, Coping Strategies, Crisis, Anxiety, Depression, Grief and Loss, Substance Abuse, Suicide, Other  [Assessed Mental Health & Cognitive Status. Offered Counseling & Supportive Services.]  Nutrition Interventions   Nutrition Discussed/Reviewed Supplemental nutrition, Decreasing sugar intake, Carbohydrate meal planning, Portion sizes, Decreasing salt, Fluid intake, Nutrition Reviewed, Decreasing fats, Increasing proteins, Adding fruits and vegetables, Nutrition Discussed  [Encouraged Heart-Healthy, Diabetic Friendly, Low Sodium, Reduced Fat, High Fiber, Renal-Friendly, Reduced Sugar Diet.]  Pharmacy Interventions   Pharmacy Dicussed/Reviewed Pharmacy Topics Discussed, Medications and their functions, Medication Adherence, Pharmacy Topics Reviewed, Affording Medications  [Confirmed Ability to Afford Prescription Medications.]  Medication Adherence --  [Confirmed Compliance with Prescription Medications.]  Safety Interventions   Safety Discussed/Reviewed Safety Discussed, Safety Reviewed, Fall Risk, Home Safety  [Encouraged Routine Use of Home Assistive Devices & Durable Medical Equipment.]  Home Safety Assistive Devices, Need for home safety assessment  [Encouraged Consideration of Home Safety Evaluation.]  Advanced Directive Interventions   Advanced Directives Discussed/Reviewed Advanced Directives Discussed, Advanced Directives Reviewed  [Confirmed Initiation of Advanced Directives (Living Will & Healthcare Power of Attorney Documents), Offering to Make Copies to Scan into Electronic Medical Record in Epic.]      Active Listening & Reflection Utilized.   Verbalization of Feelings Encouraged.  Emotional Support Provided. Acceptance & Commitment Therapy Initiated. Cognitive Behavioral Therapy Indicated. Client-Centered Therapy Completed. CSW Collaboration with Daughter, Aspasia Rude to Encourage Engagement with Danford Bad, Licensed Clinical Social Worker with [facility], [facility] [phone]), if She Has Questions, Needs Assistance, Additional Social Work Needs Are Identified in The Near Future, or If You Change Your Mind About Wanting to Receive Social Work Services.      SDOH assessments and interventions completed:  Yes.  Care Coordination Interventions:  Yes, provided.   Follow up plan: No further intervention required.   Encounter Outcome:  Patient Visit Completed.   Danford Bad, BSW, MSW, LCSW [facility], [facility] Clinical Social Worker II Direct Dial: [phone]  Fax: [phone] Website: [email or web address]

## 2023-12-10 ENCOUNTER — Encounter (INDEPENDENT_AMBULATORY_CARE_PROVIDER_SITE_OTHER): Payer: Self-pay

## 2023-12-10 ENCOUNTER — Telehealth (INDEPENDENT_AMBULATORY_CARE_PROVIDER_SITE_OTHER): Payer: Self-pay

## 2023-12-10 NOTE — Telephone Encounter (Signed)
 Refill requested on patient from [facility] for a refill on the hemorrhoidal cream 30 gm tube. I called them back and ok'd this with one refill, I spoke to [facility] the Pharmacist there

## 2023-12-17 ENCOUNTER — Other Ambulatory Visit: Payer: Self-pay | Admitting: Family Medicine

## 2023-12-17 ENCOUNTER — Ambulatory Visit: Payer: Medicare PPO | Admitting: Family Medicine

## 2023-12-25 ENCOUNTER — Ambulatory Visit: Payer: Medicare PPO | Admitting: Student

## 2023-12-25 NOTE — Progress Notes (Deleted)
   Cardiology Office Note    Date:  12/25/2023  ID:  Laura Atkins, DOB 1940/11/29, MRN [id number] Cardiologist: Dina Rich, MD    History of Present Illness:    Laura Atkins is a 83 y.o. female with past medical history of VT (occurring in 05/2023 and started on amiodarone with cardiac catheterization not pursued given advanced age, dementia and renal failure), Stage III CKD, HTN, HLD and alpha thalassemia who presents to the office today for 55-month follow-up.   She was examined by Dr. Wyline Mood in 06/2023 following her recent hospitalization for VT and was overall doing well at that time. An outpatient monitor had previously been recommended but she pulled this off due to dementia and agitation and it was recommended not to retry this. Was recommended to continue to avoid AV nodal blocking agents and to continue on amiodarone, likely indefinitely.  - TSH  ROS: ***  Studies Reviewed:   EKG: EKG is*** ordered today and demonstrates ***   EKG Interpretation Date/Time:    Ventricular Rate:    PR Interval:    QRS Duration:    QT Interval:    QTC Calculation:   R Axis:      Text Interpretation:         Echocardiogram: 05/2023 IMPRESSIONS     1. Left ventricular ejection fraction, by estimation, is 50 to 55%. The  left ventricle has low normal function. The left ventricle has no regional  wall motion abnormalities. Left ventricular diastolic parameters are  consistent with Grade I diastolic  dysfunction (impaired relaxation). Elevated left ventricular end-diastolic  pressure.   2. Right ventricular systolic function is normal. The right ventricular  size is normal.   3. The mitral valve is degenerative. No evidence of mitral valve  regurgitation. No evidence of mitral stenosis. Moderate mitral annular  calcification.   4. The aortic valve is normal in structure. Aortic valve regurgitation is  not visualized. No aortic stenosis is present.   5. The inferior vena  cava is normal in size with greater than 50%  respiratory variability, suggesting right atrial pressure of 3 mmHg.    Risk Assessment/Calculations:   {Does this patient have ATRIAL FIBRILLATION?[phone]} No BP recorded.  {Refresh Note OR Click here to enter BP  :1}***         Physical Exam:   VS:  There were no vitals taken for this visit.   Wt Readings from Last 3 Encounters:  11/04/23 208 lb (94.3 kg)  10/29/23 208 lb 0.6 oz (94.4 kg)  09/20/23 168 lb (76.2 kg)     GEN: Well nourished, well developed in no acute distress NECK: No JVD; No carotid bruits CARDIAC: ***RRR, no murmurs, rubs, gallops RESPIRATORY:  Clear to auscultation without rales, wheezing or rhonchi  ABDOMEN: Appears non-distended. No obvious abdominal masses. EXTREMITIES: No clubbing or cyanosis. No edema.  Distal pedal pulses are 2+ bilaterally.   Assessment and Plan:   1. History of VT - *** - On Amiodarone 200mg  daily. LFT's were WNL when checked in 08/2023. TSH normal at 2.026 in 04/2023 and would recheck with next set of labs.   2. HTN - ***  3. HLD - *** - Remains on Atorvastatin 80mg  daily.    Signed, Ellsworth Lennox, PA-C

## 2023-12-31 ENCOUNTER — Ambulatory Visit: Attending: Student | Admitting: Student

## 2023-12-31 ENCOUNTER — Encounter: Payer: Self-pay | Admitting: Student

## 2023-12-31 VITALS — BP 132/62 | HR 65 | Ht 68.0 in

## 2023-12-31 DIAGNOSIS — I10 Essential (primary) hypertension: Secondary | ICD-10-CM

## 2023-12-31 DIAGNOSIS — Z79899 Other long term (current) drug therapy: Secondary | ICD-10-CM

## 2023-12-31 DIAGNOSIS — E782 Mixed hyperlipidemia: Secondary | ICD-10-CM | POA: Diagnosis not present

## 2023-12-31 DIAGNOSIS — R600 Localized edema: Secondary | ICD-10-CM

## 2023-12-31 DIAGNOSIS — I472 Ventricular tachycardia, unspecified: Secondary | ICD-10-CM | POA: Diagnosis not present

## 2023-12-31 NOTE — Progress Notes (Signed)
 Cardiology Office Note    Date:  12/31/2023  ID:  ASENETH HACK, DOB 05/19/1941, MRN [id number] Cardiologist: Dina Rich, MD    History of Present Illness:    Laura Atkins is a 83 y.o. female with past medical history of VT (occurring in 05/2023 and started on Amiodarone with cardiac catheterization not pursued given advanced age, dementia and renal failure), Stage III CKD, HTN, HLD and alpha thalassemia who presents to the office today for 58-month follow-up.   She was examined by Dr. Wyline Mood in 06/2023 following her recent hospitalization for VT and was overall doing well at that time. An outpatient monitor had previously been recommended but she pulled this off due to dementia and agitation and it was recommended not to retry this. Was recommended to continue to avoid AV nodal blocking agents and to continue on Amiodarone, likely indefinitely.  In talking with the patient and her daughter today, most history is provided by her daughter given her dementia. She reports the patient does sometimes complain of discomfort in her chest but this resolves with drinking water. No exertional component but she is not very active at baseline as she is mostly wheelchair-bound and uses a walker for brief ambulation. Breathing has been stable with no progressive dyspnea on exertion, orthopnea or PND. She does experience intermittent lower extremity edema which has been occurring for several months per their report. They do try to limit sodium intake. Reports that her fluid intake is minimal and her daughter has been encouraging her to consume more fluids.  Studies Reviewed:   EKG: EKG is not ordered today.   Echocardiogram: 05/2023 IMPRESSIONS     1. Left ventricular ejection fraction, by estimation, is 50 to 55%. The  left ventricle has low normal function. The left ventricle has no regional  wall motion abnormalities. Left ventricular diastolic parameters are  consistent with Grade I diastolic   dysfunction (impaired relaxation). Elevated left ventricular end-diastolic  pressure.   2. Right ventricular systolic function is normal. The right ventricular  size is normal.   3. The mitral valve is degenerative. No evidence of mitral valve  regurgitation. No evidence of mitral stenosis. Moderate mitral annular  calcification.   4. The aortic valve is normal in structure. Aortic valve regurgitation is  not visualized. No aortic stenosis is present.   5. The inferior vena cava is normal in size with greater than 50%  respiratory variability, suggesting right atrial pressure of 3 mmHg.    Physical Exam:   VS:  BP 132/62   Pulse 65   Ht 5\' 8"  (1.727 m)   SpO2 96%   BMI 31.63 kg/m    Wt Readings from Last 3 Encounters:  11/04/23 208 lb (94.3 kg)  10/29/23 208 lb 0.6 oz (94.4 kg)  09/20/23 168 lb (76.2 kg)     GEN: Pleasant elderly female appearing in no acute distress NECK: No JVD; No carotid bruits CARDIAC: RRR, no murmurs, rubs, gallops RESPIRATORY:  Clear to auscultation without rales, wheezing or rhonchi  ABDOMEN: Appears non-distended. No obvious abdominal masses. EXTREMITIES: No clubbing or cyanosis. 1+ pitting edema bilaterally.  Distal pedal pulses are 2+ bilaterally.   Assessment and Plan:   1. History of VT - Occurred during admission in 05/2023 and had been shocked by EMS and strips were not available for review. Cardiac catheterization was not pursued given her age, dementia and comorbidities (had an AKI at that time as well with creatinine up to 6.01). Denies any  recent exertional chest pain or worsening dyspnea on exertion. - Continue current medical therapy with Amiodarone 200mg  daily. LFT's were WNL when checked in 08/2023. TSH normal at 2.026 in 04/2023 and will recheck with her next labs.  2. HTN - BP was initially recorded at 142/58, rechecked and improved to 132/62. She is not currently on antihypertensive therapy. Will continue to follow.  3. HLD -  Followed by her PCP. Remains on Atorvastatin 80mg  daily.   4.  Lower Extremity Edema - She was previously on diuretic therapy with Torsemide but this was stopped during her admission in 05/2023 due to an AKI and hypovolemia. Her.PO intake is variable. Reviewed options with the patient and her daughter today and she prefers conservative management given the patient's dementia and frequent urinary incontinence. Reviewed the importance of elevating her lower extremities and using compression stockings if able. Encouraged them to reach out if symptoms do not improve as we could use Torsemide 20 mg as needed. If starting this, would recheck renal function and electrolytes closely given her prior AKI (creatinine had improved to 1.13 when checked in 08/2023).  Signed, Ellsworth Lennox, PA-C

## 2023-12-31 NOTE — Patient Instructions (Signed)
 Medication Instructions:  Your physician recommends that you continue on your current medications as directed. Please refer to the Current Medication list given to you today.  *If you need a refill on your cardiac medications before your next appointment, please call your pharmacy*  Lab Work: Your physician recommends that you return for lab work in: CMET, TSH    If you have labs (blood work) drawn today and your tests are completely normal, you will receive your results only by: MyChart Message (if you have MyChart) OR A paper copy in the mail If you have any lab test that is abnormal or we need to change your treatment, we will call you to review the results.  Testing/Procedures: NONE   Follow-Up: At [facility], you and your health needs are our priority.  As part of our continuing mission to provide you with exceptional heart care, our providers are all part of one team.  This team includes your primary Cardiologist (physician) and Advanced Practice Providers or APPs (Physician Assistants and Nurse Practitioners) who all work together to provide you with the care you need, when you need it.  Your next appointment:   6 month(s)  Provider:   You may see Dina Rich, MD or one of the following Advanced Practice Providers on your designated Care Team:   Randall An, PA-C  [place], [place] Jacolyn Re[place], [place]     We recommend signing up for the patient portal called "MyChart".  Sign up information is provided on this After Visit Summary.  MyChart is used to connect with patients for Virtual Visits (Telemedicine).  Patients are able to view lab/test results, encounter notes, upcoming appointments, etc.  Non-urgent messages can be sent to your provider as well.   To learn more about what you can do with MyChart, go to [email or web address].   Other Instructions Thank you for choosing [facility]!

## 2024-01-16 ENCOUNTER — Other Ambulatory Visit: Payer: Self-pay | Admitting: Family Medicine

## 2024-02-16 ENCOUNTER — Other Ambulatory Visit: Payer: Self-pay | Admitting: Family Medicine

## 2024-02-19 ENCOUNTER — Telehealth: Payer: Self-pay | Admitting: Family Medicine

## 2024-02-19 NOTE — Telephone Encounter (Signed)
 FL2 forms  Noted Copied Sleeved  Original placed in provider box  Copy of form folder at front desk   Call daughter when ready.  FYI, reminder must complete list of medicine on the form if more than 12 needs to be on another FL2 form

## 2024-02-19 NOTE — Telephone Encounter (Signed)
Filled out, given to provider to sign

## 2024-02-28 ENCOUNTER — Ambulatory Visit: Admitting: Student

## 2024-03-07 DIAGNOSIS — R0600 Dyspnea, unspecified: Secondary | ICD-10-CM | POA: Diagnosis not present

## 2024-03-07 DIAGNOSIS — I701 Atherosclerosis of renal artery: Secondary | ICD-10-CM | POA: Diagnosis not present

## 2024-03-07 DIAGNOSIS — K219 Gastro-esophageal reflux disease without esophagitis: Secondary | ICD-10-CM | POA: Diagnosis not present

## 2024-03-07 DIAGNOSIS — F0390 Unspecified dementia without behavioral disturbance: Secondary | ICD-10-CM | POA: Diagnosis not present

## 2024-03-07 DIAGNOSIS — I110 Hypertensive heart disease with heart failure: Secondary | ICD-10-CM | POA: Diagnosis not present

## 2024-03-07 DIAGNOSIS — Z882 Allergy status to sulfonamides status: Secondary | ICD-10-CM | POA: Diagnosis not present

## 2024-03-07 DIAGNOSIS — D649 Anemia, unspecified: Secondary | ICD-10-CM | POA: Diagnosis not present

## 2024-03-07 DIAGNOSIS — D62 Acute posthemorrhagic anemia: Secondary | ICD-10-CM | POA: Diagnosis not present

## 2024-03-07 DIAGNOSIS — E119 Type 2 diabetes mellitus without complications: Secondary | ICD-10-CM | POA: Diagnosis not present

## 2024-03-07 DIAGNOSIS — K6289 Other specified diseases of anus and rectum: Secondary | ICD-10-CM | POA: Diagnosis not present

## 2024-03-07 DIAGNOSIS — R079 Chest pain, unspecified: Secondary | ICD-10-CM | POA: Diagnosis not present

## 2024-03-07 DIAGNOSIS — I739 Peripheral vascular disease, unspecified: Secondary | ICD-10-CM | POA: Diagnosis not present

## 2024-03-07 DIAGNOSIS — E785 Hyperlipidemia, unspecified: Secondary | ICD-10-CM | POA: Diagnosis not present

## 2024-03-07 DIAGNOSIS — E669 Obesity, unspecified: Secondary | ICD-10-CM | POA: Diagnosis not present

## 2024-03-07 DIAGNOSIS — E1122 Type 2 diabetes mellitus with diabetic chronic kidney disease: Secondary | ICD-10-CM | POA: Diagnosis not present

## 2024-03-07 DIAGNOSIS — K317 Polyp of stomach and duodenum: Secondary | ICD-10-CM | POA: Diagnosis not present

## 2024-03-07 DIAGNOSIS — I059 Rheumatic mitral valve disease, unspecified: Secondary | ICD-10-CM | POA: Diagnosis not present

## 2024-03-07 DIAGNOSIS — R195 Other fecal abnormalities: Secondary | ICD-10-CM | POA: Diagnosis not present

## 2024-03-07 DIAGNOSIS — K921 Melena: Secondary | ICD-10-CM | POA: Diagnosis not present

## 2024-03-07 DIAGNOSIS — I10 Essential (primary) hypertension: Secondary | ICD-10-CM | POA: Diagnosis not present

## 2024-03-07 DIAGNOSIS — I5032 Chronic diastolic (congestive) heart failure: Secondary | ICD-10-CM | POA: Diagnosis not present

## 2024-03-07 DIAGNOSIS — C20 Malignant neoplasm of rectum: Secondary | ICD-10-CM | POA: Diagnosis not present

## 2024-03-07 DIAGNOSIS — K922 Gastrointestinal hemorrhage, unspecified: Secondary | ICD-10-CM | POA: Diagnosis not present

## 2024-03-07 DIAGNOSIS — N1832 Chronic kidney disease, stage 3b: Secondary | ICD-10-CM | POA: Diagnosis not present

## 2024-03-07 DIAGNOSIS — Z885 Allergy status to narcotic agent status: Secondary | ICD-10-CM | POA: Diagnosis not present

## 2024-03-07 DIAGNOSIS — Z538 Procedure and treatment not carried out for other reasons: Secondary | ICD-10-CM | POA: Diagnosis not present

## 2024-03-07 DIAGNOSIS — R0602 Shortness of breath: Secondary | ICD-10-CM | POA: Diagnosis not present

## 2024-03-07 DIAGNOSIS — I509 Heart failure, unspecified: Secondary | ICD-10-CM | POA: Diagnosis not present

## 2024-03-07 DIAGNOSIS — I130 Hypertensive heart and chronic kidney disease with heart failure and stage 1 through stage 4 chronic kidney disease, or unspecified chronic kidney disease: Secondary | ICD-10-CM | POA: Diagnosis not present

## 2024-03-07 DIAGNOSIS — Z880 Allergy status to penicillin: Secondary | ICD-10-CM | POA: Diagnosis not present

## 2024-03-12 ENCOUNTER — Telehealth: Payer: Self-pay

## 2024-03-12 NOTE — Transitions of Care (Post Inpatient/ED Visit) (Signed)
   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  Transition Care Management Follow-up Telephone Call Date of Discharge: 03/11/24 Discharge Facility: Other [facility]) Name of Other (Non-[facility]) Discharge Facility: [facility] Type of Discharge: Inpatient Admission Primary Inpatient Discharge Diagnosis:: Melena   Tonia Frankel RN, CCM [place]  VBCI-Population Health RN Care Manager [phone]

## 2024-03-17 DIAGNOSIS — C20 Malignant neoplasm of rectum: Secondary | ICD-10-CM | POA: Diagnosis not present

## 2024-03-17 DIAGNOSIS — D62 Acute posthemorrhagic anemia: Secondary | ICD-10-CM | POA: Diagnosis not present

## 2024-03-17 DIAGNOSIS — K921 Melena: Secondary | ICD-10-CM | POA: Diagnosis not present

## 2024-03-17 DIAGNOSIS — K6289 Other specified diseases of anus and rectum: Secondary | ICD-10-CM | POA: Diagnosis not present

## 2024-03-17 DIAGNOSIS — I10 Essential (primary) hypertension: Secondary | ICD-10-CM | POA: Diagnosis not present

## 2024-03-30 DIAGNOSIS — C20 Malignant neoplasm of rectum: Secondary | ICD-10-CM | POA: Diagnosis not present

## 2024-04-02 DIAGNOSIS — C20 Malignant neoplasm of rectum: Secondary | ICD-10-CM | POA: Diagnosis not present

## 2024-04-02 DIAGNOSIS — D649 Anemia, unspecified: Secondary | ICD-10-CM | POA: Diagnosis not present

## 2024-04-07 ENCOUNTER — Other Ambulatory Visit: Payer: Self-pay

## 2024-04-07 ENCOUNTER — Telehealth: Payer: Self-pay

## 2024-04-07 MED ORDER — HYDRALAZINE HCL 25 MG PO TABS: 25.0000 mg | ORAL_TABLET | Freq: Three times a day (TID) | ORAL | 0 refills | Status: DC

## 2024-04-07 NOTE — Telephone Encounter (Signed)
 Copied from CRM [id number]. Topic: Clinical - Medication Question >> Apr 07, 2024 12:22 PM Elle L wrote: Reason for CRM: The patient's daughter states that the patient was discharged from the hospital on the 18th of June. She was diagnosed with colorectal cancer and prescribed Hydralazine  hcl 25 mg every 8 hours. Unfortunately, as it was passed the 14 days since her being discharged from the hospital I had to schedule her in as a normal office visit but the patient will run out of medication before her appointment. She is requesting to see if Dr. Antonetta can prescribe the medication before her appointment. Her call back number is [phone].   Preferred Pharmacy: [facility] - [place], [place] - [address], [place] [address] Phone: [phone]  Fax: [phone]

## 2024-04-07 NOTE — Telephone Encounter (Signed)
 Sent, pt daughter informed

## 2024-04-08 DIAGNOSIS — C20 Malignant neoplasm of rectum: Secondary | ICD-10-CM | POA: Diagnosis not present

## 2024-04-09 DIAGNOSIS — C20 Malignant neoplasm of rectum: Secondary | ICD-10-CM | POA: Diagnosis not present

## 2024-04-09 DIAGNOSIS — D509 Iron deficiency anemia, unspecified: Secondary | ICD-10-CM | POA: Diagnosis not present

## 2024-04-09 DIAGNOSIS — Z510 Encounter for antineoplastic radiation therapy: Secondary | ICD-10-CM | POA: Diagnosis not present

## 2024-04-13 DIAGNOSIS — C20 Malignant neoplasm of rectum: Secondary | ICD-10-CM | POA: Diagnosis not present

## 2024-04-14 DIAGNOSIS — C20 Malignant neoplasm of rectum: Secondary | ICD-10-CM | POA: Diagnosis not present

## 2024-04-14 DIAGNOSIS — Z510 Encounter for antineoplastic radiation therapy: Secondary | ICD-10-CM | POA: Diagnosis not present

## 2024-04-15 ENCOUNTER — Other Ambulatory Visit: Payer: Self-pay | Admitting: Family Medicine

## 2024-04-20 DIAGNOSIS — Z510 Encounter for antineoplastic radiation therapy: Secondary | ICD-10-CM | POA: Diagnosis not present

## 2024-04-20 DIAGNOSIS — C20 Malignant neoplasm of rectum: Secondary | ICD-10-CM | POA: Diagnosis not present

## 2024-04-21 ENCOUNTER — Ambulatory Visit: Payer: Self-pay | Admitting: Family Medicine

## 2024-04-21 DIAGNOSIS — C20 Malignant neoplasm of rectum: Secondary | ICD-10-CM | POA: Diagnosis not present

## 2024-04-21 DIAGNOSIS — Z510 Encounter for antineoplastic radiation therapy: Secondary | ICD-10-CM | POA: Diagnosis not present

## 2024-04-22 DIAGNOSIS — C20 Malignant neoplasm of rectum: Secondary | ICD-10-CM | POA: Diagnosis not present

## 2024-04-22 DIAGNOSIS — Z510 Encounter for antineoplastic radiation therapy: Secondary | ICD-10-CM | POA: Diagnosis not present

## 2024-04-23 DIAGNOSIS — D509 Iron deficiency anemia, unspecified: Secondary | ICD-10-CM | POA: Diagnosis not present

## 2024-04-23 DIAGNOSIS — Z510 Encounter for antineoplastic radiation therapy: Secondary | ICD-10-CM | POA: Diagnosis not present

## 2024-04-23 DIAGNOSIS — C20 Malignant neoplasm of rectum: Secondary | ICD-10-CM | POA: Diagnosis not present

## 2024-04-24 DIAGNOSIS — C20 Malignant neoplasm of rectum: Secondary | ICD-10-CM | POA: Diagnosis not present

## 2024-04-24 DIAGNOSIS — Z510 Encounter for antineoplastic radiation therapy: Secondary | ICD-10-CM | POA: Diagnosis not present

## 2024-04-29 DIAGNOSIS — D509 Iron deficiency anemia, unspecified: Secondary | ICD-10-CM | POA: Diagnosis not present

## 2024-04-29 DIAGNOSIS — D630 Anemia in neoplastic disease: Secondary | ICD-10-CM | POA: Diagnosis not present

## 2024-04-29 DIAGNOSIS — C20 Malignant neoplasm of rectum: Secondary | ICD-10-CM | POA: Diagnosis not present

## 2024-05-04 ENCOUNTER — Ambulatory Visit: Payer: Medicare PPO | Admitting: Nurse Practitioner

## 2024-05-07 ENCOUNTER — Ambulatory Visit (INDEPENDENT_AMBULATORY_CARE_PROVIDER_SITE_OTHER): Admitting: Family Medicine

## 2024-05-07 ENCOUNTER — Encounter: Payer: Self-pay | Admitting: Family Medicine

## 2024-05-07 VITALS — BP 149/90 | HR 87 | Resp 16 | Ht 68.0 in

## 2024-05-07 DIAGNOSIS — E1169 Type 2 diabetes mellitus with other specified complication: Secondary | ICD-10-CM | POA: Diagnosis not present

## 2024-05-07 DIAGNOSIS — E1149 Type 2 diabetes mellitus with other diabetic neurological complication: Secondary | ICD-10-CM | POA: Diagnosis not present

## 2024-05-07 DIAGNOSIS — I10 Essential (primary) hypertension: Secondary | ICD-10-CM | POA: Diagnosis not present

## 2024-05-07 DIAGNOSIS — E785 Hyperlipidemia, unspecified: Secondary | ICD-10-CM | POA: Diagnosis not present

## 2024-05-07 DIAGNOSIS — N184 Chronic kidney disease, stage 4 (severe): Secondary | ICD-10-CM

## 2024-05-07 DIAGNOSIS — Z09 Encounter for follow-up examination after completed treatment for conditions other than malignant neoplasm: Secondary | ICD-10-CM | POA: Diagnosis not present

## 2024-05-07 DIAGNOSIS — F02811 Dementia in other diseases classified elsewhere, unspecified severity, with agitation: Secondary | ICD-10-CM | POA: Diagnosis not present

## 2024-05-07 DIAGNOSIS — C19 Malignant neoplasm of rectosigmoid junction: Secondary | ICD-10-CM

## 2024-05-07 MED ORDER — HYDRALAZINE HCL 25 MG PO TABS: 25.0000 mg | ORAL_TABLET | Freq: Three times a day (TID) | ORAL | 5 refills | Status: DC

## 2024-05-07 MED ORDER — MIRTAZAPINE 15 MG PO TABS: 15.0000 mg | ORAL_TABLET | Freq: Every day | ORAL | 5 refills | Status: AC

## 2024-05-07 NOTE — Progress Notes (Unsigned)
 Laura Atkins     MRN: [id number]      DOB: May 10, 1941 r Chief Complaint  Patient presents with   Medical Management of Chronic Issues    Following up from being at [facility], in June 15-18     HPI Laura Atkins is here for follow up Of hospitalization june 14 due to weakness and illness, and was transferred from there to [name] ([facility]), dx of  colorectal cancer made during hospitalization from June 15 to 18, definitive dx of cancer made on colonoscopy 03/09/2024. Management is radiaition treatment 5 days in 1 week for10 min sessions July 28 to August !, 2025to shrink tumor, palliative care , upcoming appt with oncology is 08 /21/2025 Was transfused 04/29/2024 Has labs for [facility] 08/21 and her next appt with oncology Dr Santina 09/11 [facility] ROS Denies recent fever or chills. Denies sinus pressure, nasal congestion, ear pain or sore throat. Denies chest congestion, productive cough or wheezing. Denies chest pains, palpitations and leg swelling Denies abdominal pain, nausea, vomiting,diarrhea or constipation.   Denies dysuria, frequency, hesitancy or incontinence. Chronic  joint pain, swelling and limitation in mobility. Denies headaches, seizures, c/o chronic lowe extremity numbness, and  tingling. Denies depression, anxiety or uncontrolled  insomnia on medication Denies skin break down or rash.   PE  BP (!) 149/90   Pulse 87   Resp 16   Ht 5' 8 (1.727 m)   SpO2 97%   BMI 31.63 kg/m   Patient alert and oriented and in no cardiopulmonary distress.  HEENT: No facial asymmetry, EOMI,     Neck decreased ROM .  Chest: Clear to auscultation bilaterally.  CVS: S1, S2 no murmurs, no S3.Regular rate.  ABD: Soft non tender.   Ext: trace bilateral  edema  FD:fjmxziob decreased  ROM spine, shoulders, hips and knees.  Skin: Intact, no ulcerations or rash noted.  Psych: Good eye contact, normal affect. Memory loss  not anxious or depressed appearing.  CNS: CN 2-12 intact, power,   normal throughout.no focal deficits noted.   Assessment & Plan  Type 2 diabetes mellitus with neurological complications (HCC) Diabetes associated with hypertension, hyperlipidemia, obesity, CKD, and depression  Laura Atkins is reminded of the importance of commitment to daily physical activity for 30 minutes or more, as able and the need to limit carbohydrate intake to 30 to 60 grams per meal to help with blood sugar control.   Laura Atkins is reminded of the importance of daily foot exam, annual eye examination, and good blood sugar, blood pressure and cholesterol control. Managd by Endo and well controled currently on no medication     Latest Ref Rng & Units 05/08/2024    1:00 PM 11/04/2023    1:28 PM 09/20/2023   12:24 PM 06/22/2023    4:31 AM 06/21/2023    3:26 PM  Diabetic Labs  HbA1c 4.0 - 5.6 %  6.1      Micro/Creat Ratio 0 - 29 mg/g creat 185       Creatinine 0.44 - 1.00 mg/dL   8.86  8.48  8.36       05/07/2024    8:59 AM 12/31/2023    2:04 PM 12/31/2023    1:42 PM 11/04/2023    1:12 PM 10/29/2023    3:55 PM 10/29/2023    3:54 PM 09/20/2023    6:00 PM  BP/Weight  Systolic BP 149 132 142 134 163 155 169  Diastolic BP 90 62 58 58 79 74 80  Wt. (Lbs)    208  208.04   BMI    32.58 kg/m2  32.58 kg/m2       Latest Ref Rng & Units 12/01/2021    9:40 AM 02/15/2021   12:00 AM  Foot/eye exam completion dates  Eye Exam No Retinopathy  Retinopathy      Foot Form Completion  Done      This result is from an external source.      Needs nail clipping refer tfc  Hospital discharge follow-up Patient in for follow up of recent hospitalization. And chronic care  Discharge summary, and laboratory and radiology data are reviewed, and any questions or concerns  are discussed. Specific issues requiring follow up are specifically addressed.   Colorectal cancer (HCC) Upcoming f/u appt with Oncology, palliative  care based on age and general health Pt appears well adjusted stating she has a  certificate to share re this new dx an dis very pleased with it  Essential hypertension Adequate though sub optimal control, no med changes  Hyperlipidemia associated with type 2 diabetes mellitus (HCC) Hyperlipidemia:Low fat diet discussed and encouraged.   Lipid Panel  Lab Results  Component Value Date   CHOL 153 10/24/2021   HDL 60 10/24/2021   LDLCALC 70 10/24/2021   TRIG 136 10/24/2021   CHOLHDL 2.6 10/24/2021     Controlled, no change in medication   Dementia (HCC) Progressing , continue meds currently taking  Chronic kidney disease, stage 4 (severe) (HCC) Followed by Nephrology

## 2024-05-07 NOTE — Assessment & Plan Note (Signed)
 Needs nail clipping refer tfc

## 2024-05-07 NOTE — Patient Instructions (Signed)
 F/U early December  STOP citalopram  Hydralazine  has been prescribed  Please drop off urine specimen for urine ACR in the next 1 week if possible  Medications are as listed  CONGRATS on your CERTIFICATE!!!  We will have to make you one as well!!  [facility] are [facility]  Please schedule follow up with Cardiology since you were hospitalized when you last visit was due  Caregul not to fall  Thanks for choosing [facility], we consider it a privelige to serve you.

## 2024-05-08 DIAGNOSIS — E1149 Type 2 diabetes mellitus with other diabetic neurological complication: Secondary | ICD-10-CM | POA: Diagnosis not present

## 2024-05-10 ENCOUNTER — Encounter: Payer: Self-pay | Admitting: Family Medicine

## 2024-05-10 LAB — MICROALBUMIN / CREATININE URINE RATIO
Creatinine, Urine: 98.8 mg/dL
Microalb/Creat Ratio: 185 mg/g{creat} — ABNORMAL HIGH (ref 0–29)
Microalbumin, Urine: 182.7 ug/mL

## 2024-05-10 NOTE — Progress Notes (Incomplete)
   Laura Atkins     MRN: [id number]      DOB: 08/05/1941 r Chief Complaint  Patient presents with  . Medical Management of Chronic Issues    Following up from being at [facility], in June 15-18     HPI Laura Atkins is here for follow up Of hospitalization june 14 due to weakness and illness, and was transferred from there to [name] ([facility]), dx of  colorectal cancer made during hospitalization from June 15 to 18, definitive dx of cancer made on colonoscopy 03/09/2024. Management is radiaition treatment 5 days in 1 week for10 min sessions July 28 to August !, 2025to shrink tumor, palliative care , upcoming appt with oncology is 08 /21/2025 Was transfused 04/29/2024 Has labs for [facility] 08/21 and her next appt with oncology Dr Santina 09/11 [facility] ROS Denies recent fever or chills. Denies sinus pressure, nasal congestion, ear pain or sore throat. Denies chest congestion, productive cough or wheezing. Denies chest pains, palpitations and leg swelling Denies abdominal pain, nausea, vomiting,diarrhea or constipation.   Denies dysuria, frequency, hesitancy or incontinence. Chronic  joint pain, swelling and limitation in mobility. Denies headaches, seizures, c/o chronic lowe extremity numbness, and  tingling. Denies depression, anxiety or uncontrolled  insomnia on medication Denies skin break down or rash.   PE  BP (!) 149/90   Pulse 87   Resp 16   Ht 5' 8 (1.727 m)   SpO2 97%   BMI 31.63 kg/m   Patient alert and oriented and in no cardiopulmonary distress.  HEENT: No facial asymmetry, EOMI,     Neck decreased ROM .  Chest: Clear to auscultation bilaterally.  CVS: S1, S2 no murmurs, no S3.Regular rate.  ABD: Soft non tender.   Ext: trace bilateral  edema  FD:fjmxziob decreased  ROM spine, shoulders, hips and knees.  Skin: Intact, no ulcerations or rash noted.  Psych: Good eye contact, normal affect. Memory loss  not anxious or depressed appearing.  CNS: CN 2-12 intact,  power,  normal throughout.no focal deficits noted.   Assessment & Plan  No problem-specific Assessment & Plan notes found for this encounter.

## 2024-05-11 ENCOUNTER — Encounter: Payer: Self-pay | Admitting: Podiatry

## 2024-05-11 ENCOUNTER — Ambulatory Visit (INDEPENDENT_AMBULATORY_CARE_PROVIDER_SITE_OTHER): Admitting: Podiatry

## 2024-05-11 DIAGNOSIS — M79675 Pain in left toe(s): Secondary | ICD-10-CM

## 2024-05-11 DIAGNOSIS — E11621 Type 2 diabetes mellitus with foot ulcer: Secondary | ICD-10-CM | POA: Diagnosis not present

## 2024-05-11 DIAGNOSIS — C19 Malignant neoplasm of rectosigmoid junction: Secondary | ICD-10-CM | POA: Insufficient documentation

## 2024-05-11 DIAGNOSIS — L97522 Non-pressure chronic ulcer of other part of left foot with fat layer exposed: Secondary | ICD-10-CM | POA: Diagnosis not present

## 2024-05-11 DIAGNOSIS — M79674 Pain in right toe(s): Secondary | ICD-10-CM

## 2024-05-11 DIAGNOSIS — B351 Tinea unguium: Secondary | ICD-10-CM | POA: Diagnosis not present

## 2024-05-11 DIAGNOSIS — Z89411 Acquired absence of right great toe: Secondary | ICD-10-CM

## 2024-05-11 MED ORDER — MUPIROCIN 2 % EX OINT: TOPICAL_OINTMENT | CUTANEOUS | 1 refills | Status: AC

## 2024-05-11 NOTE — Assessment & Plan Note (Signed)
 Adequate though sub optimal control, no med changes

## 2024-05-11 NOTE — Progress Notes (Unsigned)
 Subjective:  Patient ID: Laura Atkins, female    DOB: 1941-07-20,  MRN: [id number]  Laura Atkins presents to clinic today for:  Chief Complaint  Patient presents with   Diabetes    [facility] callus on bottom of R great toe, L great toe open callus daughter said she picked at it, No anti coag   Patient notes nails are thick and elongated, causing pain in shoe gear when ambulating.  Patient has a partial right hallux amputation from previous infection in the past.  She notes a new open wound to the left great toe.  The family member that is with her today states that the patient was picking at the toe and caused this to occur.  Patient is allergic to gentamicin  ointment.  They do not have any wound care supplies at home.  As she is wearing a surgical shoe that she had from previous foot issue.  PCP is Laura Rollene BRAVO, MD. last seen 05/07/2024  Past Medical History:  Diagnosis Date   Alpha thalassemia trait 01/26/2010   02/2012: Nl CBC ex H&H-10.7/34.8, MCV-69    Anemia    Anxiety    Anxiety and depression    Cellulitis 05/09/2017   Depression    Diabetes mellitus    Foot pain, right 04/30/2013   GERD (gastroesophageal reflux disease)    Headache(784.0)    Hyperlipidemia    Hypertension    Iron  deficiency 01/21/2017   Microcytic anemia 01/26/2010   02/2012: Nl CBC ex H&H-10.7/34.8, MCV-69    NECK PAIN, CHRONIC 10/21/2008   +chronic back pain     Obesity    Obstructive sleep apnea    Osteoarthritis    Left knee; right shoulder; chronic neck and back pain   Pruritus    PVD (peripheral vascular disease) (HCC) 01/28/2014   Seizures (HCC)    Shoulder pain, right 04/14/2015   Tremor    This started months ago after her seizure progressing to very poor hand writing   Uncontrolled type 2 diabetes mellitus with hypoglycemia, with long-term current use of insulin  (HCC) 06/12/2022   Urinary incontinence    UTI (urinary tract infection) 01/18/2013   Allergies  Allergen Reactions    Penicillins Shortness Of Breath, Itching and Rash   Prednisone Shortness Of Breath, Itching and Rash   Propoxyphene N-Acetaminophen  Itching and Nausea And Vomiting    Darvocet    Gentamicin  Rash    Cream   Sulfa  Antibiotics Itching and Nausea And Vomiting    Objective:  Laura Atkins is a pleasant 83 y.o. female in NAD. AAO x 3.  Vascular Examination: Patient has palpable DP pulse, absent PT pulse bilateral.  Delayed capillary refill bilateral toes.  Sparse digital hair bilateral.  Proximal to distal cooling WNL bilateral.    Dermatological Examination: Interspaces are clear with no open lesions noted bilateral.  Skin is shiny and atrophic bilateral.  Nails are 3-24mm thick, with yellowish/brown discoloration, subungual debris and distal onycholysis x10.  There is pain with compression of nails x10.  There is a full-thickness ulcer on the plantar-distal aspect of the left hallux.  This probes to subcutaneous tissue.  There is moderate hyperkeratosis around the rim.  There is granular base.  Minimal serosanguineous drainage is noted.  No malodor or necrosis is seen.  No exposed bone or tendon is noted.  This measures approximately 0.7 x 0.4 x 0.3 cm.  No erythema or edema is noted.     Latest Ref Rng &  Units 11/04/2023    1:28 PM 06/18/2023    8:31 PM  Hemoglobin A1C  Hemoglobin-A1c 4.0 - 5.6 % 6.1  6.5    Patient qualifies for at-risk foot care because of diabetes with PVD, history of previous partial toe amputation.  Assessment/Plan: 1. Type 2 diabetes mellitus with foot ulcer (CODE) (HCC)   2. History of amputation of right great toe (HCC)   3. Pain due to onychomycosis of toenails of both feet   4. Ulcer of great toe, left, with fat layer exposed (HCC)     Meds ordered this encounter  Medications   mupirocin  ointment (BACTROBAN ) 2 %    Sig: Apply thin layer to ulcer and cover with gauze dressing, once daily.    Dispense:  22 g    Refill:  1    Mycotic nails x 9 were  sharply debrided with sterile nail nippers and power debriding burr to decrease bulk and length.  Prescription for Bactroban  ointment sent to the pharmacy.  She will apply this daily followed by gauze dressing.  A prism  order was completed for wound care supplies to be sent to her home.  The ulceration was debrided of hyperkeratotic and devitalized soft tissue to the level of subcutaneous tissue.  Post debridement measurements were 0.8 x 0.5 x 0.3 cm.  Antibiotic ointment and a dry sterile dressing was applied.  She will change this once daily at home.  Continue with the surgical shoe on the left foot.  Return in about 2 weeks (around 05/25/2024) for f/u ulcer L hallux.   Laura Atkins, DPM, FACFAS [facility]     [address] [place], [place] [address]                Office [phone]  Fax [phone]

## 2024-05-11 NOTE — Assessment & Plan Note (Signed)
 Patient in for follow up of recent hospitalization. And chronic care  Discharge summary, and laboratory and radiology data are reviewed, and any questions or concerns  are discussed. Specific issues requiring follow up are specifically addressed.

## 2024-05-11 NOTE — Assessment & Plan Note (Signed)
 Followed by Nephrology

## 2024-05-11 NOTE — Assessment & Plan Note (Signed)
Hyperlipidemia:Low fat diet discussed and encouraged. ? ? ?Lipid Panel  ?Lab Results  ?Component Value Date  ? CHOL 153 10/24/2021  ? HDL 60 10/24/2021  ? [place] 70 10/24/2021  ? TRIG 136 10/24/2021  ? CHOLHDL 2.6 10/24/2021  ? ?Controlled, no change in medication ? ? ? ?

## 2024-05-11 NOTE — Assessment & Plan Note (Signed)
 Progressing , continue meds currently taking

## 2024-05-11 NOTE — Assessment & Plan Note (Addendum)
 Upcoming f/u appt with Oncology, palliative  care based on age and general health Pt appears well adjusted stating she has a certificate to share re this new dx an dis very pleased with it

## 2024-05-12 DIAGNOSIS — D638 Anemia in other chronic diseases classified elsewhere: Secondary | ICD-10-CM | POA: Diagnosis not present

## 2024-05-12 DIAGNOSIS — I129 Hypertensive chronic kidney disease with stage 1 through stage 4 chronic kidney disease, or unspecified chronic kidney disease: Secondary | ICD-10-CM | POA: Diagnosis not present

## 2024-05-12 DIAGNOSIS — N189 Chronic kidney disease, unspecified: Secondary | ICD-10-CM | POA: Diagnosis not present

## 2024-05-12 DIAGNOSIS — E1122 Type 2 diabetes mellitus with diabetic chronic kidney disease: Secondary | ICD-10-CM | POA: Diagnosis not present

## 2024-05-12 DIAGNOSIS — N1832 Chronic kidney disease, stage 3b: Secondary | ICD-10-CM | POA: Diagnosis not present

## 2024-05-12 DIAGNOSIS — D509 Iron deficiency anemia, unspecified: Secondary | ICD-10-CM | POA: Diagnosis not present

## 2024-05-12 DIAGNOSIS — E611 Iron deficiency: Secondary | ICD-10-CM | POA: Diagnosis not present

## 2024-05-12 DIAGNOSIS — E1129 Type 2 diabetes mellitus with other diabetic kidney complication: Secondary | ICD-10-CM | POA: Diagnosis not present

## 2024-05-17 ENCOUNTER — Other Ambulatory Visit: Payer: Self-pay | Admitting: Family Medicine

## 2024-05-21 ENCOUNTER — Other Ambulatory Visit: Payer: Self-pay | Admitting: *Deleted

## 2024-05-26 ENCOUNTER — Encounter (INDEPENDENT_AMBULATORY_CARE_PROVIDER_SITE_OTHER): Admitting: Podiatry

## 2024-05-26 DIAGNOSIS — Z91199 Patient's noncompliance with other medical treatment and regimen due to unspecified reason: Secondary | ICD-10-CM

## 2024-05-26 NOTE — Progress Notes (Signed)
 Patient did not show for her scheduled ulcer follow-up today.

## 2024-06-04 DIAGNOSIS — D649 Anemia, unspecified: Secondary | ICD-10-CM | POA: Diagnosis not present

## 2024-06-04 DIAGNOSIS — C20 Malignant neoplasm of rectum: Secondary | ICD-10-CM | POA: Diagnosis not present

## 2024-06-05 DIAGNOSIS — D631 Anemia in chronic kidney disease: Secondary | ICD-10-CM | POA: Diagnosis not present

## 2024-06-05 DIAGNOSIS — R809 Proteinuria, unspecified: Secondary | ICD-10-CM | POA: Diagnosis not present

## 2024-06-05 DIAGNOSIS — N189 Chronic kidney disease, unspecified: Secondary | ICD-10-CM | POA: Diagnosis not present

## 2024-06-08 DIAGNOSIS — D638 Anemia in other chronic diseases classified elsewhere: Secondary | ICD-10-CM | POA: Diagnosis not present

## 2024-06-08 DIAGNOSIS — E1122 Type 2 diabetes mellitus with diabetic chronic kidney disease: Secondary | ICD-10-CM | POA: Diagnosis not present

## 2024-06-08 DIAGNOSIS — I129 Hypertensive chronic kidney disease with stage 1 through stage 4 chronic kidney disease, or unspecified chronic kidney disease: Secondary | ICD-10-CM | POA: Diagnosis not present

## 2024-06-08 DIAGNOSIS — E1129 Type 2 diabetes mellitus with other diabetic kidney complication: Secondary | ICD-10-CM | POA: Diagnosis not present

## 2024-06-10 DIAGNOSIS — C20 Malignant neoplasm of rectum: Secondary | ICD-10-CM | POA: Diagnosis not present

## 2024-06-10 DIAGNOSIS — D509 Iron deficiency anemia, unspecified: Secondary | ICD-10-CM | POA: Diagnosis not present

## 2024-06-12 NOTE — Telephone Encounter (Signed)
 Paperwork needing revision with cancer diagnoses- placed in provider's box

## 2024-06-15 NOTE — Telephone Encounter (Signed)
 Per Caelyn, given to [employer]

## 2024-06-19 ENCOUNTER — Ambulatory Visit: Admitting: Nurse Practitioner

## 2024-06-24 ENCOUNTER — Telehealth: Payer: Self-pay

## 2024-06-24 ENCOUNTER — Ambulatory Visit: Admitting: Nurse Practitioner

## 2024-06-24 DIAGNOSIS — E1122 Type 2 diabetes mellitus with diabetic chronic kidney disease: Secondary | ICD-10-CM

## 2024-06-24 DIAGNOSIS — E782 Mixed hyperlipidemia: Secondary | ICD-10-CM

## 2024-06-24 DIAGNOSIS — I10 Essential (primary) hypertension: Secondary | ICD-10-CM

## 2024-06-24 NOTE — Telephone Encounter (Signed)
 Reena aware ready for pick up at the front desk.

## 2024-07-02 DIAGNOSIS — D509 Iron deficiency anemia, unspecified: Secondary | ICD-10-CM | POA: Diagnosis not present

## 2024-07-08 ENCOUNTER — Encounter (INDEPENDENT_AMBULATORY_CARE_PROVIDER_SITE_OTHER): Payer: Self-pay | Admitting: Gastroenterology

## 2024-07-13 ENCOUNTER — Other Ambulatory Visit: Payer: Self-pay | Admitting: Family Medicine

## 2024-07-14 DIAGNOSIS — C20 Malignant neoplasm of rectum: Secondary | ICD-10-CM | POA: Diagnosis not present

## 2024-07-14 DIAGNOSIS — I10 Essential (primary) hypertension: Secondary | ICD-10-CM | POA: Diagnosis not present

## 2024-07-14 DIAGNOSIS — G893 Neoplasm related pain (acute) (chronic): Secondary | ICD-10-CM | POA: Diagnosis not present

## 2024-07-23 ENCOUNTER — Other Ambulatory Visit: Payer: Self-pay | Admitting: Family Medicine

## 2024-07-24 ENCOUNTER — Telehealth: Payer: Self-pay

## 2024-07-24 ENCOUNTER — Other Ambulatory Visit: Payer: Self-pay | Admitting: Family Medicine

## 2024-07-24 NOTE — Telephone Encounter (Signed)
 Copied from CRM #[id number]. Topic: Clinical - Medication Question >> Jul 24, 2024  2:33 PM Leonette P wrote: Reason for CRM: patient wants to know what the citalopram was saying refill not appropriate.  The pharmacy said she needed to call her provider.

## 2024-07-27 ENCOUNTER — Other Ambulatory Visit: Payer: Self-pay | Admitting: Family Medicine

## 2024-07-27 MED ORDER — CITALOPRAM HYDROBROMIDE 10 MG PO TABS: 10.0000 mg | ORAL_TABLET | Freq: Every day | ORAL | 2 refills | Status: DC

## 2024-07-28 ENCOUNTER — Other Ambulatory Visit: Payer: Self-pay

## 2024-07-28 NOTE — Progress Notes (Deleted)
 Cardiology Office Note    Date:  07/28/2024  ID:  Laura Atkins, DOB [birth year], MRN [id number] Cardiologist: Alvan Carrier, MD { :  History of Present Illness:    Laura Atkins is a 83 y.o. female with past medical history of VT (occurring in 05/2023 and started on Amiodarone  with cardiac catheterization not pursued given advanced age, dementia and renal failure), Stage III CKD, HTN, HLD and alpha thalassemia  who presents to the office today for 30-month follow-up.   She was last examined by myself in 12/2023 and most history was provided by her daughter given her dementia.  She did report having occasional episodes of chest pain but symptoms would resolve with consuming water .  She was mostly wheelchair-bound at baseline.  She was continue on amiodarone  200 mg daily given her history of presumed VT and no changes were made to her medications at that time.  She did have lower extremity edema and had previously developed an AKI with torsemide .  Given her variable p.o. intake, conservative measures were recommended and she was encouraged to use compression stockings.  In the interim, she was admitted to [facility] in 02/2024 for acute blood loss anemia and hemoglobin had been 4 at presentation.  Received PRBCs and colonoscopy showed a 6 cm ulcerated mass along the rectum.  Was ultimately diagnosed with rectal cancer and she is being followed by oncology.  Surgical intervention was not recommended given her age and dementia.  She ultimately underwent a short course of radiation with plans for overall conservative management.  ROS: ***  Studies Reviewed:   EKG: EKG is*** ordered today and demonstrates ***   EKG Interpretation Date/Time:    Ventricular Rate:    PR Interval:    QRS Duration:    QT Interval:    QTC Calculation:   R Axis:      Text Interpretation:         Echocardiogram: 05/2023 IMPRESSIONS     1. Left ventricular ejection fraction, by estimation, is 50 to 55%.  The  left ventricle has low normal function. The left ventricle has no regional  wall motion abnormalities. Left ventricular diastolic parameters are  consistent with Grade I diastolic  dysfunction (impaired relaxation). Elevated left ventricular end-diastolic  pressure.   2. Right ventricular systolic function is normal. The right ventricular  size is normal.   3. The mitral valve is degenerative. No evidence of mitral valve  regurgitation. No evidence of mitral stenosis. Moderate mitral annular  calcification.   4. The aortic valve is normal in structure. Aortic valve regurgitation is  not visualized. No aortic stenosis is present.   5. The inferior vena cava is normal in size with greater than 50%  respiratory variability, suggesting right atrial pressure of 3 mmHg.    Risk Assessment/Calculations:   {Does this patient have ATRIAL FIBRILLATION?:[phone]} No BP recorded.  {Refresh Note OR Click here to enter BP  :1}***         Physical Exam:   VS:  There were no vitals taken for this visit.   Wt Readings from Last 3 Encounters:  11/04/23 208 lb (94.3 kg)  10/29/23 208 lb 0.6 oz (94.4 kg)  09/20/23 168 lb (76.2 kg)     GEN: Well nourished, well developed in no acute distress NECK: No JVD; No carotid bruits CARDIAC: ***RRR, no murmurs, rubs, gallops RESPIRATORY:  Clear to auscultation without rales, wheezing or rhonchi  ABDOMEN: Appears non-distended. No obvious abdominal masses. EXTREMITIES: No clubbing  or cyanosis. No edema.  Distal pedal pulses are 2+ bilaterally.   Assessment and Plan:      {Are you ordering a CV Procedure (e.g. stress test, cath, DCCV, TEE, etc)?   Press F2        :[id number]}   Signed, Laymon CHRISTELLA Qua, PA-C

## 2024-07-29 ENCOUNTER — Ambulatory Visit: Attending: Student | Admitting: Student

## 2024-07-29 DIAGNOSIS — I472 Ventricular tachycardia, unspecified: Secondary | ICD-10-CM

## 2024-07-29 DIAGNOSIS — I10 Essential (primary) hypertension: Secondary | ICD-10-CM

## 2024-07-29 DIAGNOSIS — E782 Mixed hyperlipidemia: Secondary | ICD-10-CM

## 2024-07-31 DIAGNOSIS — D509 Iron deficiency anemia, unspecified: Secondary | ICD-10-CM | POA: Diagnosis not present

## 2024-08-04 ENCOUNTER — Other Ambulatory Visit: Payer: Self-pay

## 2024-08-04 NOTE — Progress Notes (Signed)
 Pharmacy Quality Measure Review  This patient is appearing on a report for being at risk of failing the adherence measure for cholesterol (statin) medications this calendar year.   Medication: atorvastatin  40 mg  Last fill date: 04/27/24 for 30 day supply  Spoke with patient's daughter [facility]) and she stated that her mother gets pill packs. She states that her pill packs have atorvastatin  in them.   Jenkins Graces, PharmD PGY1 Pharmacy Resident [phone]

## 2024-08-10 ENCOUNTER — Other Ambulatory Visit: Payer: Self-pay

## 2024-08-10 ENCOUNTER — Encounter (HOSPITAL_COMMUNITY): Payer: Self-pay

## 2024-08-10 ENCOUNTER — Encounter: Payer: Self-pay | Admitting: Family Medicine

## 2024-08-10 ENCOUNTER — Ambulatory Visit: Payer: Self-pay | Admitting: Family Medicine

## 2024-08-10 ENCOUNTER — Ambulatory Visit: Admitting: Family Medicine

## 2024-08-10 ENCOUNTER — Emergency Department (HOSPITAL_COMMUNITY)
Admission: EM | Admit: 2024-08-10 | Discharge: 2024-08-10 | Discharge status: Against Medical Advice | Attending: Emergency Medicine | Admitting: Emergency Medicine

## 2024-08-10 ENCOUNTER — Ambulatory Visit: Payer: Self-pay

## 2024-08-10 VITALS — BP 169/70 | HR 58 | Resp 16

## 2024-08-10 DIAGNOSIS — E860 Dehydration: Secondary | ICD-10-CM | POA: Diagnosis not present

## 2024-08-10 DIAGNOSIS — D631 Anemia in chronic kidney disease: Secondary | ICD-10-CM | POA: Diagnosis not present

## 2024-08-10 DIAGNOSIS — Z23 Encounter for immunization: Secondary | ICD-10-CM | POA: Insufficient documentation

## 2024-08-10 DIAGNOSIS — N189 Chronic kidney disease, unspecified: Secondary | ICD-10-CM

## 2024-08-10 DIAGNOSIS — Z5321 Procedure and treatment not carried out due to patient leaving prior to being seen by health care provider: Secondary | ICD-10-CM | POA: Insufficient documentation

## 2024-08-10 DIAGNOSIS — R630 Anorexia: Secondary | ICD-10-CM | POA: Insufficient documentation

## 2024-08-10 DIAGNOSIS — C19 Malignant neoplasm of rectosigmoid junction: Secondary | ICD-10-CM

## 2024-08-10 DIAGNOSIS — I10 Essential (primary) hypertension: Secondary | ICD-10-CM | POA: Diagnosis not present

## 2024-08-10 DIAGNOSIS — R8290 Unspecified abnormal findings in urine: Secondary | ICD-10-CM | POA: Diagnosis not present

## 2024-08-10 DIAGNOSIS — Z85048 Personal history of other malignant neoplasm of rectum, rectosigmoid junction, and anus: Secondary | ICD-10-CM | POA: Insufficient documentation

## 2024-08-10 DIAGNOSIS — F0390 Unspecified dementia without behavioral disturbance: Secondary | ICD-10-CM | POA: Diagnosis not present

## 2024-08-10 DIAGNOSIS — C20 Malignant neoplasm of rectum: Secondary | ICD-10-CM | POA: Insufficient documentation

## 2024-08-10 LAB — CBC
HCT: 34.5 % — ABNORMAL LOW (ref 36.0–46.0)
Hemoglobin: 10.4 g/dL — ABNORMAL LOW (ref 12.0–15.0)
MCH: 22.3 pg — ABNORMAL LOW (ref 26.0–34.0)
MCHC: 30.1 g/dL (ref 30.0–36.0)
MCV: 74 fL — ABNORMAL LOW (ref 80.0–100.0)
Platelets: 268 K/uL (ref 150–400)
RBC: 4.66 MIL/uL (ref 3.87–5.11)
RDW: 15.9 % — ABNORMAL HIGH (ref 11.5–15.5)
WBC: 4.2 K/uL (ref 4.0–10.5)
nRBC: 0 % (ref 0.0–0.2)

## 2024-08-10 LAB — COMPREHENSIVE METABOLIC PANEL WITH GFR
ALT: 18 U/L (ref 0–44)
AST: 19 U/L (ref 15–41)
Albumin: 3.6 g/dL (ref 3.5–5.0)
Alkaline Phosphatase: 110 U/L (ref 38–126)
Anion gap: 7 (ref 5–15)
BUN: 12 mg/dL (ref 8–23)
CO2: 34 mmol/L — ABNORMAL HIGH (ref 22–32)
Calcium: 9 mg/dL (ref 8.9–10.3)
Chloride: 101 mmol/L (ref 98–111)
Creatinine, Ser: 1.07 mg/dL — ABNORMAL HIGH (ref 0.44–1.00)
GFR, Estimated: 52 mL/min — ABNORMAL LOW (ref >60)
Glucose, Bld: 155 mg/dL — ABNORMAL HIGH (ref 70–99)
Potassium: 3.6 mmol/L (ref 3.5–5.1)
Sodium: 142 mmol/L (ref 135–145)
Total Bilirubin: 0.3 mg/dL (ref 0.0–1.2)
Total Protein: 6.5 g/dL (ref 6.5–8.1)

## 2024-08-10 NOTE — Assessment & Plan Note (Signed)
 Poor apetite x 1 week and increased fatigue with low energy, ED eval

## 2024-08-10 NOTE — Assessment & Plan Note (Signed)
 After obtaining informed consent, the vaccine is  administered , with no adverse effect noted at the time of administration.

## 2024-08-10 NOTE — Assessment & Plan Note (Signed)
 Elevated at visit , will address at follow up, inconsistently taking meds esp night medications for past week

## 2024-08-10 NOTE — ED Triage Notes (Addendum)
 Patient come in POV for complaint of chronic UTI was seen by Dr. Antonetta, Patient has decreased intake. PCP wanting patient to be seen for fluids. History of UTIs and rectal cancer and demintia

## 2024-08-10 NOTE — Assessment & Plan Note (Signed)
 S/s concerning for UTI, at high risk for complications from this, since 1 week h/o preceeding of poor oral intake , eD eval warranted

## 2024-08-10 NOTE — Progress Notes (Signed)
   Laura Atkins     MRN: [id number]      DOB: Oct 31, 1940  Chief Complaint  Patient presents with   Urinary Tract Infection    Pt complains burning, frequency, urgency, and odor and urination since Saturday. No fever. Lower back pain    HPI Laura Atkins is here with a 1 week h/o poor appetite, not eating or drinking much and a 3 day h/o malodorous urine c concerning for UTI She is being treated for rectal cancer symptomatic treatment only No fever documented but always c/o chills ROS . Denies sinus pressure, nasal congestion, ear pain or sore throat. Denies chest congestion, productive cough or wheezing. Denies leg swelling Chronic  abdominal pain and  ,diarrhea e. Chronic  joint pain, swelling and limitation in mobility. . C/o depression, anxiety and  insomnia. Denies skin break down or rash.   PE  BP (!) 169/70   Pulse (!) 58   Resp 16   SpO2 95%   Patient alert  and in no cardiopulmonary distress.  HEENT: No facial asymmetry, EOMI,     Neck decreased ROM .  Chest: Clear to auscultation bilaterally.  CVS: S1, S2 no murmurs, no S3.Regular rate.  ABD: Soft diffuse superficial tenderness, no guarding or rebound  Ext: No edema  MS: decreased  ROM spine, shoulders, hips and knees.  Skin: Intact, no ulcerations or rash noted.  Psych: Good eye contact, . Memory losst not anxious or depressed appearing.  CNS: CN 2-12 intact, power,  normal throughout.no focal deficits noted.   Assessment & Plan  Malodorous urine S/s concerning for UTI, at high risk for complications from this, since 1 week h/o preceeding of poor oral intake , eD eval warranted  Poor appetite 1 week history, needs vealuation for dehydration and electrolyte imbalance, sent to ED  Anemia Poor apetite x 1 week and increased fatigue with low energy, ED eval  Colorectal cancer [facility]) Daughter reports that appears to be increasing in size , has repeat imaging scheduled this week, palliative care  only  Essential hypertension Elevated at visit , will address at follow up, inconsistently taking meds esp night medications for past week  Influenza vaccination administered at current visit After obtaining informed consent, the vaccine is  administered , with no adverse effect noted at the time of administration.

## 2024-08-10 NOTE — Telephone Encounter (Signed)
 Urinary odor x 2 days ago  Patient's daughter doesn't want to take the patient to Urgent Care and she didn't know if her mother (the patient) would be comfortable with a female provider examining her.  Daughter wanted to speak with someone in the office. This RN booked this appointment as advised by CAL and CAL spoke with daughter further about the availability of appointments at this time. Patient warm transferred to CAL for further assistance   FYI Only or Action Required?: FYI only for provider: appointment scheduled on 08/10/2024 at 2:10pm with Dr Jacqulyn Ahle.  Patient was last seen in primary care on 05/07/2024 by Antonetta Rollene BRAVO, MD.  Called Nurse Triage reporting Dysuria.  Symptoms began 3-4 days ago.  Interventions attempted: Rest, hydration, or home remedies.  Symptoms are: gradually worsening.  Triage Disposition: See HCP Within 4 Hours (Or PCP Triage)  Patient/caregiver understands and will follow disposition?:              Copied from CRM #[id number]. Topic: Clinical - Red Word Triage >> Aug 10, 2024  8:36 AM Frederich PARAS wrote: [facility] that prompted transfer to Nurse Triage: pain when urine  Daughter Reena on the line she says she think/ knows her mom has a uti,  pain when urine, smell, came up saturday evening. Reason for Disposition  Side (flank) or lower back pain present  Answer Assessment - Initial Assessment Questions Urinary odor x 2 days ago  Patient's daughter doesn't want to take the patient to Urgent Care and she didn't know if her mother (the patient) would be comfortable with a female provider examining her.  Daughter wanted to speak with someone in the office. This RN booked this appointment as advised by CAL and CAL spoke with daughter further about the availability of appointments at this time.  Patient is advised to call us  back if anything changes or with any further questions/concerns. Patient is advised that if anything worsens to go to the  Emergency Room. Patient verbalized understanding.    1. SYMPTOM: What's the main symptom you're concerned about? (e.g., frequency, incontinence)     Odor in urine, frequency,  2. ONSET: When did the  urinary symptoms  start?     About 4 days ago 3. PAIN: Is there any pain? If Yes, ask: How bad is it? (Scale: 1-10; mild, moderate, severe)     Burning and some lower back pain and lower abdominopelvic pain 4. CAUSE: What do you think is causing the symptoms?     UTI 5. OTHER SYMPTOMS: Do you have any other symptoms? (e.g., blood in urine, fever, flank pain, pain with urination)     Pain with urination  Protocols used: Urinary Symptoms-A-AH

## 2024-08-10 NOTE — Assessment & Plan Note (Signed)
 Daughter reports that appears to be increasing in size , has repeat imaging scheduled this week, palliative care only

## 2024-08-10 NOTE — Assessment & Plan Note (Signed)
 1 week history, needs vealuation for dehydration and electrolyte imbalance, sent to ED

## 2024-08-10 NOTE — Patient Instructions (Addendum)
 F/U in December as before  You need to go to the ED for evaluation since you have been eating and drinking significantly less for 1 week and you also have a 3 day h/o malodorous urine   Flu vaccine in office today  Thanks for choosing [facility], we consider it a privelige to serve you.

## 2024-08-12 DIAGNOSIS — F0390 Unspecified dementia without behavioral disturbance: Secondary | ICD-10-CM | POA: Diagnosis not present

## 2024-08-12 DIAGNOSIS — I10 Essential (primary) hypertension: Secondary | ICD-10-CM | POA: Diagnosis not present

## 2024-08-12 DIAGNOSIS — C20 Malignant neoplasm of rectum: Secondary | ICD-10-CM | POA: Diagnosis not present

## 2024-08-12 DIAGNOSIS — G893 Neoplasm related pain (acute) (chronic): Secondary | ICD-10-CM | POA: Diagnosis not present

## 2024-08-12 DIAGNOSIS — E876 Hypokalemia: Secondary | ICD-10-CM | POA: Diagnosis not present

## 2024-08-12 DIAGNOSIS — N3090 Cystitis, unspecified without hematuria: Secondary | ICD-10-CM | POA: Diagnosis not present

## 2024-08-12 NOTE — ED Provider Notes (Signed)
 Medical screening exam initiated in triage. HPI reviewed. Vital signs reviewed.  No signs of distress in triage.    Lower abd pain and dysuria. Denies fevers, N/V   T.Keller, FNP 2:44 PM 08/12/2024   Emergency Department Note   Chief Complaint:  dysuria    HPI Laura Atkins is a 83 y.o. female with history of dementia, colorectal cancer on palliative care only, hypertension presenting to the emergency department today for concerns for UTI.  Per her daughter, she has had about a week of painful urination, frequent urination and possibly increased confusion.  Patient's daughter says that she is at her baseline now.  Says that she is prone to UTIs and she feels that this is what is going on.  No fevers or chills, nausea vomiting or diarrhea.  Does endorse some lower suprapubic discomfort.   Independent Historian: Family member daughter at bedside provides hx as above    ROS Pertinent positive and negative ROS per HPI     Physical Exam Vitals:   08/12/24 1443  BP: (!) 166/65  BP Location: Right Upper Arm  Pulse: 59  Resp: 16  Temp: 97.9 F (36.6 C)  TempSrc: Oral  SpO2: 98%  Weight: 98 kg (216 lb)   General: A&Ox1, NAD, nontoxic HEENT: MMM, trachea midline, PERRL, EOMI CV: RRR Resp: no increased WOB, symmetric expansion GI: non-distended, non-tender MSK: MAEx4, no gross deformities Skin: warm, dry Neuro: A&Ox1 (baseline), no FND Psychiatric: cooperative, appropriate mood and affect     Procedures None     ED Course & Medical Decision Making 83 year old with history as above presenting to the emergency department today for concerns for dysuria.  She was afebrile, hemodynamically stable and nontoxic on arrival.  She was at her baseline mental status.  Will obtain basic labs, urinalysis.  Mild hypokalemia to 3.3, will order oral repletion.  No leukocytosis, lowering suspicion for ascending infection or other serious abdominal infection.  Straight cath with greater than  100 WBCs and moderate bacteria, will treat with antibiotics.  Given one-time ceftriaxone  dose here, will send prescription for Keflex .  Advised follow-up with PCP within 48 hours for repeat evaluation.  Discussed strict return precautions.   Reassessment: Patient is currently stable and appropriate for outpatient disposition. I have discussed evaluation, findings and treatment provided. No additional interventions are required at this time. Patient is in agreement with this outpatient plan and discharged accordingly to follow up with PCP as discussed. Patient instructed to return to ED if continued, worsening or newly developed symptoms occur.     The patient and / or the family were informed of the results of any tests, a time was given to answer questions.   I am the primary attending physician of record for this patient.   Independent interpretation of studies: Labs reviewed and as per MDM  Tests considered but not performed: no.  Chronic condition affecting patient care : dementia      Clinical Impression & Disposition Final diagnoses:  Cystitis  Hypokalemia    Discharge home   New Prescriptions   CEPHALEXIN  (KEFLEX ) 500 MG CAPSULE    Take one capsule (500 mg dose) by mouth 2 (two) times daily for 7 days.      Quantity: 14 capsule    Refills: 0          Contributory History Reviewed from chart  Allergies: Allergies[1]    Personal History: Active Ambulatory Problems    Diagnosis Date Noted  . Acute blood loss anemia  03/07/2024  . Melena 03/07/2024  . Diabetes mellitus type 2, noninsulin dependent (*) 03/07/2024  . Obesity (BMI 30-39.9) 03/07/2024  . Stage 3b chronic kidney disease (*) 03/07/2024  . Dementia (*) 03/07/2024  . Primary hypertension 03/07/2024  . Chronic diastolic CHF (congestive heart failure) (*) 03/07/2024  . Rectal mass 03/11/2024  . Rectal cancer (*) 04/02/2024  . IDA (iron  deficiency anemia) 04/03/2024   Resolved Ambulatory Problems    Diagnosis  Date Noted  . No Resolved Ambulatory Problems   Past Medical History:  Diagnosis Date  . Cancer (*)     Past Medical History:  Diagnosis Date  . Cancer (*)     History reviewed. No pertinent surgical history. Short Social History[2]   Home Medications: Home Medications   AMIODARONE  (PACERONE ) 200 MG TABLET    Take one tablet (200 mg dose) by mouth daily.   AMLODIPINE  BESYLATE (NORVASC ) 5 MG TABLET    Take one tablet (5 mg dose) by mouth daily.   ATORVASTATIN  (LIPITOR) 40 MG TABLET    Take one tablet (40 mg dose) by mouth daily.   CHOLECALCIFEROL 125 MCG (5000 UT) TABS    Take 1 tablet by mouth daily.   CITALOPRAM  HYDROBROMIDE (CELEXA ) 10 MG TABLET    Take one tablet (10 mg dose) by mouth daily.   CYANOCOBALAMIN  1000 MCG TABLET    Take one tablet (1,000 mcg dose) by mouth daily.   DIPHENHYDRAMINE  (BANOPHEN ) 25 MG TABLET    Take one tablet (25 mg dose) by mouth at bedtime.   GABAPENTIN  (NEURONTIN ) 300 MG CAPSULE    Take one capsule (300 mg dose) by mouth at bedtime.   HYDRALAZINE  HCL (APRESOLINE ) 25 MG TABLET    Take one tablet (25 mg dose) by mouth every 8 (eight) hours.   HYDROCODONE -ACETAMINOPHEN  (NORCO) 7.5-325 MG PER TABLET    Take one tablet by mouth every 6 (six) hours as needed for Pain for up to 30 days. Max Daily Amount: 4 tablets   MAGNESIUM  250 MG TABS    Take two tablets by mouth daily.   MIRTAZAPINE  (REMERON ) 15 MG TABLET    Take one tablet (15 mg dose) by mouth at bedtime.   MULTIPLE VITAMIN (MULTIVITAMIN) TABLET    Take one tablet by mouth daily.   OMEPRAZOLE  (PRILOSEC) 20 MG CAPSULE    Take one capsule (20 mg dose) by mouth daily.   PANTOPRAZOLE  SODIUM (PROTONIX ) 40 MG TABLET    Take one tablet (40 mg dose) by mouth daily.        Results Labs:   CBC AND DIFFERENTIAL - Abnormal      Result Value   WBC 5.0     RBC 4.46     HGB 9.6 (*)    HCT 32.2 (*)    MCV 72.2 (*)    MCH 21.5 (*)    MCHC 29.8 (*)    Plt Ct 242     RDW SD 40.7     MPV 10.2     NRBC% 0.0      Absolute NRBC Count 0.00     NEUTROPHIL % 68.6     LYMPHOCYTE % 19.3     MONOCYTE % 9.1     Eosinophil % 2.4     BASOPHIL % 0.4     IG% 0.2     ABSOLUTE NEUTROPHIL COUNT 3.45     ABSOLUTE LYMPHOCYTE COUNT 0.97 (*)    Absolute Monocyte Count 0.46     Absolute Eosinophil Count  0.12     Absolute Basophil Count 0.02     Absolute Immature Granulocyte Count 0.01    COMPREHENSIVE METABOLIC PANEL - Abnormal   Na 143     Potassium 3.3 (*)    Cl 101     CO2 30     AGAP 12     Glucose 123 (*)    BUN 13     Creatinine 1.06 (*)    Ca 9.0     ALK PHOS 101     T Bili 0.3     Total Protein 6.6     Alb 3.4 (*)    GLOBULIN 3.2     ALBUMIN/GLOBULIN RATIO 1.1     BUN/CREAT RATIO 12.3     ALT 17     AST 19     eGFR 53 (*)    Comment: Normal GFR (glomerular filtration rate) > 60 mL/min/1.73 meters squared, < 60 may include impaired kidney function. Calculation based on the Chronic Kidney Disease Epidemiology Collaboration (CK-EPI)equation refit without adjustment for race.  URINALYSIS W/MICRO REFLEX CULTURE - SYMPTOMATIC - Abnormal   Urine Color Colorless (*)    Urine Clarity Cloudy (*)    Urine Specific Gravity 1.009     Urine pH 6.0     Urine Protein - Dipstick 100 (*)    Urine Glucose Negative     Urine Ketones Negative     Urine Bilirubin Negative     Urine Blood >1.0 (*)    Urine Urobilinogen <2     Urine Nitrite Negative     Urine Leukocyte Esterase 500 (*)    Urine Squamous Epithelial Cells <1     Urine WBC >100 (*)    Urine RBC >182 (*)    Urine Bacteria Moderate (*)    UA Microscopic Yes Micro (*)   CULTURE, URINE      Imaging: No data to display    EKG: No results found for this visit on 08/12/24.    ED Meds Given: Medications  potassium chloride  (K-DUR,KLOR-CON ) CR tablet 40 mEq (has no administration in time range)  cefTRIAXone  (ROCEPHIN ) injection 2 g (has no administration in time range)      Mikayla Earnie Dodge, MD [facility] Emergency  Department       [1] Allergies Allergen Reactions  . Penicillins Rash and Chest Pain  . Sulfa  Antibiotics Rash and Chest Pain  [2] Social History Tobacco Use  . Smoking status: Never  . Smokeless tobacco: Never  Vaping Use  . Vaping status: Never Used  Substance Use Topics  . Alcohol use: Never  . Drug use: Never   Mikayla Leombruno, MD 08/12/24 1753

## 2024-08-13 ENCOUNTER — Telehealth: Payer: Self-pay

## 2024-08-13 ENCOUNTER — Other Ambulatory Visit: Payer: Self-pay

## 2024-08-13 DIAGNOSIS — E876 Hypokalemia: Secondary | ICD-10-CM

## 2024-08-13 NOTE — Telephone Encounter (Signed)
 Copied from CRM [id number]. Topic: Appointments - Scheduling Inquiry for Clinic >> Aug 13, 2024 11:33 AM Shanda MATSU wrote: Reason for CRM: Patient's daughter, Yesha Muchow RA  [phone], called in req to schedule a hospital follow up within these next 2 days, caller stated patient went to the ER yesterday and was adv that she needed to follow up with her PCP within 2 days of discharge due to having to recv potassium when she went to the ER.

## 2024-08-19 DIAGNOSIS — N3289 Other specified disorders of bladder: Secondary | ICD-10-CM | POA: Diagnosis not present

## 2024-08-19 DIAGNOSIS — G893 Neoplasm related pain (acute) (chronic): Secondary | ICD-10-CM | POA: Diagnosis not present

## 2024-08-19 DIAGNOSIS — C20 Malignant neoplasm of rectum: Secondary | ICD-10-CM | POA: Diagnosis not present

## 2024-08-25 ENCOUNTER — Ambulatory Visit: Admitting: Internal Medicine

## 2024-08-27 DIAGNOSIS — R630 Anorexia: Secondary | ICD-10-CM | POA: Diagnosis not present

## 2024-08-27 DIAGNOSIS — D509 Iron deficiency anemia, unspecified: Secondary | ICD-10-CM | POA: Diagnosis not present

## 2024-08-27 DIAGNOSIS — G4700 Insomnia, unspecified: Secondary | ICD-10-CM | POA: Diagnosis not present

## 2024-08-27 DIAGNOSIS — C20 Malignant neoplasm of rectum: Secondary | ICD-10-CM | POA: Diagnosis not present

## 2024-09-11 ENCOUNTER — Ambulatory Visit: Admitting: Family Medicine

## 2024-09-29 NOTE — Progress Notes (Deleted)
 " Cardiology Office Note:  .   Date:  09/29/2024  ID:  Laura Atkins, DOB 09/20/1941, MRN [id number] PCP: Laura Rollene BRAVO, MD  [facility] Providers Cardiologist:  Laura Carrier, MD {  History of Present Illness: .   Laura Atkins is a 84 y.o. female  with PMHx of VT, Stage III CKD (followed by Dr. Rachele), HTN, HLD, dementia, alpha thalassemia (followed by hematology), locally advanced rectal cancer (followed by oncology on palliative radiation)  who reports to [facility] office for follow up.   Pertinent cardiac medical history:  Nuclear stress test 02/17/2013 was probably negative; noted small and mild defect that likely represents breast attenuation.  VT Occurred in 05/2023 shocked by EMS and in ER with strips unavailable. Started on Amiodarone  with cardiac catheterization not pursued given advanced age, dementia and renal failure Per Dr. Alvan 06/2023, previously tried outpatient monitor but was pulled off by patient due to dementia and agitation with recommendation not to retry this.  Recommended to continue to avoid AV nodal blocking agents and to continue amiodarone  likely indefinitely. EF preserved since 2014 with most recent Echo 05/2023: EF 50 to 55%, low normal LV function, G1 DD, elevated LVEDP, no significant valvular disease  Last seen in heartcare 12/2023 by Laura Qua, PA-C for 57-month follow-up.  Per daughter, reported some chest discomfort that resolved with drinking water  with no exertional component that was not of concern for ACS.  Also reported intermittent LE edema despite limiting sodium intake.  Noted that patient was previously on torsemide  but this was stopped during 05/2023 admission due to an AKI and hypovolemia.  Daughter prefers conservative management given patient's dementia and frequent urinary incontinence.  Discussed leg elevation and compression stockings.  Recommended to contact office if no improvement in symptoms and consider torsemide  20  mg as needed.  Continued on ASA 81 mg, Lipitor 40 mg nightly, amiodarone  200 mg daily.  Recent hospitalization   Today, reports ### and denies ###.  Denies chest pain, shortness of breath, palpitations, syncope, presyncope, dizziness, orthopnea, PND, swelling or significant weight changes, acute bleeding, or claudication.   Reports compliance with medications.  Dietary habitats:  Activity level: She is not very active at baseline as she is mostly wheelchair-bound and uses a walker for brief ambulation. Social: Denies tobacco use/alcohol/drug use  Denies any recent hospitalizations or visits to the emergency department.   Ventricular tachycardia (HCC) Denies any recent exertional chest pain or worsening dyspnea on exertion.  Continue Amiodarone  200 mg daily (LFT WNL in 07/2024, TSH WNL in 04/2023)  Avoid AV nodal blockers.  Avoid QT prolonging medications.   Essential hypertension Reports well controlled Home BP:  BP this OV well controlled today:  Continue on / Managed by GDMT as above.  Encourage physical activity for 150 minutes per week and heart healthy low sodium diet. Discussed limiting sodium intake to < 2 grams daily.     Hyperlipidemia LDL goal <70 Followed by her PCP. Continue to Lipitor 40 mg daily  Bilateral lower extremity edema Previously noted that daughter prefers conservative management due to patient's dementia and urinary incontinence. Discussed the importance of leg elevation and compression stockings. If no improvements in symptoms then would consider torsemide  20 mg as needed after checking renal function and electrolytes.   ROS: 10 point review of system has been reviewed and considered negative except ones been listed in the HPI.   Studies Reviewed: .    Echocardiogram: 05/2023 IMPRESSIONS   1. Left ventricular  ejection fraction, by estimation, is 50 to 55%. The  left ventricle has low normal function. The left ventricle has no regional  wall motion  abnormalities. Left ventricular diastolic parameters are  consistent with Grade I diastolic  dysfunction (impaired relaxation). Elevated left ventricular end-diastolic  pressure.   2. Right ventricular systolic function is normal. The right ventricular  size is normal.   3. The mitral valve is degenerative. No evidence of mitral valve  regurgitation. No evidence of mitral stenosis. Moderate mitral annular  calcification.   4. The aortic valve is normal in structure. Aortic valve regurgitation is  not visualized. No aortic stenosis is present.   5. The inferior vena cava is normal in size with greater than 50%  respiratory variability, suggesting right atrial pressure of 3 mmHg.   CV Studies: Cardiac studies reviewed are outlined and summarized above. Otherwise please see EMR for full report.   Risk Assessment/Calculations:   {Does this patient have ATRIAL FIBRILLATION?[phone]} No BP recorded.  {Refresh Note OR Click here to enter BP  :1}***       Physical Exam:   VS:  There were no vitals taken for this visit.   Wt Readings from Last 3 Encounters:  08/10/24 237 lb (107.5 kg)  11/04/23 208 lb (94.3 kg)  10/29/23 208 lb 0.6 oz (94.4 kg)    GEN: Well nourished, well developed in no acute distress while sitting in chair.  NECK: No JVD; No carotid bruits CARDIAC: ***RRR, no murmurs, rubs, gallops RESPIRATORY:  Clear to auscultation without rales, wheezing or rhonchi  ABDOMEN: Soft, non-tender, non-distended EXTREMITIES:  No edema; No deformity   ASSESSMENT AND PLAN: .   ***    {Are you ordering a CV Procedure (e.g. stress test, cath, DCCV, TEE, etc)?   Press F2        :[id number]}  Dispo: ***  Signed, Laura CINDERELLA Kapur, PA-C  "

## 2024-09-30 ENCOUNTER — Ambulatory Visit: Attending: Physician Assistant | Admitting: Physician Assistant

## 2024-09-30 DIAGNOSIS — I472 Ventricular tachycardia, unspecified: Secondary | ICD-10-CM

## 2024-09-30 DIAGNOSIS — E785 Hyperlipidemia, unspecified: Secondary | ICD-10-CM

## 2024-09-30 DIAGNOSIS — I10 Essential (primary) hypertension: Secondary | ICD-10-CM

## 2024-09-30 DIAGNOSIS — R600 Localized edema: Secondary | ICD-10-CM

## 2024-10-11 ENCOUNTER — Other Ambulatory Visit: Payer: Self-pay | Admitting: Family Medicine

## 2024-10-12 ENCOUNTER — Other Ambulatory Visit: Payer: Self-pay | Admitting: Family Medicine
